# Patient Record
Sex: Female | Born: 1970
Health system: Southern US, Community
[De-identification: ages and names within clinical notes are randomized; demographics above are authoritative.]

## PROBLEM LIST (undated history)

## (undated) DIAGNOSIS — N281 Cyst of kidney, acquired: Secondary | ICD-10-CM

## (undated) DIAGNOSIS — Z973 Presence of spectacles and contact lenses: Secondary | ICD-10-CM

## (undated) DIAGNOSIS — T8859XA Other complications of anesthesia, initial encounter: Secondary | ICD-10-CM

## (undated) DIAGNOSIS — N182 Chronic kidney disease, stage 2 (mild): Secondary | ICD-10-CM

## (undated) DIAGNOSIS — F40240 Claustrophobia: Secondary | ICD-10-CM

## (undated) DIAGNOSIS — Z8489 Family history of other specified conditions: Secondary | ICD-10-CM

## (undated) DIAGNOSIS — R42 Dizziness and giddiness: Secondary | ICD-10-CM

## (undated) DIAGNOSIS — J189 Pneumonia, unspecified organism: Secondary | ICD-10-CM

## (undated) DIAGNOSIS — G43909 Migraine, unspecified, not intractable, without status migrainosus: Secondary | ICD-10-CM

## (undated) DIAGNOSIS — R51 Headache: Secondary | ICD-10-CM

## (undated) DIAGNOSIS — F419 Anxiety disorder, unspecified: Secondary | ICD-10-CM

## (undated) DIAGNOSIS — Z8619 Personal history of other infectious and parasitic diseases: Secondary | ICD-10-CM

## (undated) DIAGNOSIS — Z889 Allergy status to unspecified drugs, medicaments and biological substances status: Secondary | ICD-10-CM

## (undated) DIAGNOSIS — G903 Multi-system degeneration of the autonomic nervous system: Secondary | ICD-10-CM

## (undated) DIAGNOSIS — I4711 Inappropriate sinus tachycardia, so stated: Secondary | ICD-10-CM

## (undated) DIAGNOSIS — E7800 Pure hypercholesterolemia, unspecified: Secondary | ICD-10-CM

## (undated) DIAGNOSIS — I10 Essential (primary) hypertension: Secondary | ICD-10-CM

## (undated) DIAGNOSIS — M1990 Unspecified osteoarthritis, unspecified site: Secondary | ICD-10-CM

## (undated) DIAGNOSIS — J45909 Unspecified asthma, uncomplicated: Secondary | ICD-10-CM

## (undated) DIAGNOSIS — F32A Depression, unspecified: Secondary | ICD-10-CM

## (undated) DIAGNOSIS — R112 Nausea with vomiting, unspecified: Secondary | ICD-10-CM

## (undated) DIAGNOSIS — Z87442 Personal history of urinary calculi: Secondary | ICD-10-CM

## (undated) DIAGNOSIS — I9589 Other hypotension: Secondary | ICD-10-CM

## (undated) DIAGNOSIS — C539 Malignant neoplasm of cervix uteri, unspecified: Secondary | ICD-10-CM

## (undated) DIAGNOSIS — D649 Anemia, unspecified: Secondary | ICD-10-CM

## (undated) DIAGNOSIS — T703XXA Caisson disease [decompression sickness], initial encounter: Secondary | ICD-10-CM

## (undated) DIAGNOSIS — G4730 Sleep apnea, unspecified: Secondary | ICD-10-CM

## (undated) DIAGNOSIS — E039 Hypothyroidism, unspecified: Secondary | ICD-10-CM

## (undated) DIAGNOSIS — E669 Obesity, unspecified: Secondary | ICD-10-CM

## (undated) DIAGNOSIS — N189 Chronic kidney disease, unspecified: Secondary | ICD-10-CM

## (undated) DIAGNOSIS — G8929 Other chronic pain: Secondary | ICD-10-CM

## (undated) DIAGNOSIS — T85192A Other mechanical complication of implanted electronic neurostimulator (electrode) of spinal cord, initial encounter: Secondary | ICD-10-CM

## (undated) DIAGNOSIS — Z9889 Other specified postprocedural states: Secondary | ICD-10-CM

## (undated) DIAGNOSIS — K219 Gastro-esophageal reflux disease without esophagitis: Secondary | ICD-10-CM

## (undated) DIAGNOSIS — R519 Headache, unspecified: Secondary | ICD-10-CM

## (undated) DIAGNOSIS — N179 Acute kidney failure, unspecified: Secondary | ICD-10-CM

## (undated) HISTORY — PX: FLEXIBLE BRONCHOSCOPY: SHX5094

## (undated) HISTORY — PX: LAPAROSCOPIC GASTRIC SLEEVE RESECTION WITH HIATAL HERNIA REPAIR: SHX6512

## (undated) HISTORY — PX: TONSILLECTOMY: SUR1361

## (undated) HISTORY — PX: APPENDECTOMY: SHX54

## (undated) HISTORY — PX: BACK SURGERY: SHX140

## (undated) HISTORY — PX: ABDOMINAL HYSTERECTOMY: SHX81

## (undated) HISTORY — PX: BILIOPANCREATIC DIVISION W DUODENAL SWITCH: SHX1098

## (undated) HISTORY — PX: SPINAL CORD STIMULATOR IMPLANT: SHX2422

## (undated) HISTORY — PX: BILIOPANCREATIC DIVERSION: SHX1097

## (undated) HISTORY — PX: CHOLECYSTECTOMY: SHX55

## (undated) HISTORY — PX: HERNIA REPAIR: SHX51

## (undated) HISTORY — PX: DIAGNOSTIC LAPAROSCOPY: SUR761

## (undated) HISTORY — PX: KNEE ARTHROSCOPY: SUR90

---

## 2005-08-31 ENCOUNTER — Ambulatory Visit: Payer: Self-pay | Admitting: Unknown Physician Specialty

## 2006-08-29 ENCOUNTER — Emergency Department: Payer: Self-pay | Admitting: Emergency Medicine

## 2006-12-31 ENCOUNTER — Ambulatory Visit: Payer: Self-pay | Admitting: Unknown Physician Specialty

## 2007-07-02 ENCOUNTER — Ambulatory Visit: Payer: Self-pay | Admitting: Family Medicine

## 2007-07-02 ENCOUNTER — Encounter (INDEPENDENT_AMBULATORY_CARE_PROVIDER_SITE_OTHER): Payer: Self-pay | Admitting: Internal Medicine

## 2007-07-02 DIAGNOSIS — G43009 Migraine without aura, not intractable, without status migrainosus: Secondary | ICD-10-CM | POA: Insufficient documentation

## 2007-07-02 DIAGNOSIS — J45909 Unspecified asthma, uncomplicated: Secondary | ICD-10-CM | POA: Insufficient documentation

## 2007-07-02 DIAGNOSIS — J309 Allergic rhinitis, unspecified: Secondary | ICD-10-CM | POA: Insufficient documentation

## 2007-07-02 DIAGNOSIS — C539 Malignant neoplasm of cervix uteri, unspecified: Secondary | ICD-10-CM | POA: Insufficient documentation

## 2007-07-02 DIAGNOSIS — J3089 Other allergic rhinitis: Secondary | ICD-10-CM | POA: Insufficient documentation

## 2007-07-02 DIAGNOSIS — R030 Elevated blood-pressure reading, without diagnosis of hypertension: Secondary | ICD-10-CM | POA: Insufficient documentation

## 2007-07-02 DIAGNOSIS — K219 Gastro-esophageal reflux disease without esophagitis: Secondary | ICD-10-CM | POA: Insufficient documentation

## 2007-07-02 DIAGNOSIS — F329 Major depressive disorder, single episode, unspecified: Secondary | ICD-10-CM | POA: Insufficient documentation

## 2007-07-03 ENCOUNTER — Encounter (INDEPENDENT_AMBULATORY_CARE_PROVIDER_SITE_OTHER): Payer: Self-pay | Admitting: Internal Medicine

## 2007-09-25 ENCOUNTER — Ambulatory Visit: Payer: Self-pay

## 2007-10-02 ENCOUNTER — Ambulatory Visit: Payer: Self-pay

## 2007-10-29 ENCOUNTER — Ambulatory Visit: Payer: Self-pay | Admitting: Obstetrics and Gynecology

## 2007-11-04 ENCOUNTER — Inpatient Hospital Stay: Payer: Self-pay | Admitting: Obstetrics and Gynecology

## 2007-11-20 ENCOUNTER — Ambulatory Visit: Payer: Self-pay | Admitting: Obstetrics and Gynecology

## 2007-12-16 ENCOUNTER — Ambulatory Visit: Payer: Self-pay | Admitting: Family Medicine

## 2007-12-16 DIAGNOSIS — M79609 Pain in unspecified limb: Secondary | ICD-10-CM | POA: Insufficient documentation

## 2008-04-28 ENCOUNTER — Ambulatory Visit: Payer: Self-pay | Admitting: Family Medicine

## 2008-04-28 DIAGNOSIS — R609 Edema, unspecified: Secondary | ICD-10-CM | POA: Insufficient documentation

## 2008-04-29 LAB — CONVERTED CEMR LAB
ALT: 41 units/L — ABNORMAL HIGH (ref 0–35)
AST: 24 units/L (ref 0–37)
Albumin: 4.1 g/dL (ref 3.5–5.2)
Alkaline Phosphatase: 54 units/L (ref 39–117)
BUN: 12 mg/dL (ref 6–23)
Basophils Absolute: 0.1 10*3/uL (ref 0.0–0.1)
Basophils Relative: 0.9 % (ref 0.0–3.0)
Bilirubin, Direct: 0.1 mg/dL (ref 0.0–0.3)
CO2: 27 meq/L (ref 19–32)
Calcium: 9.2 mg/dL (ref 8.4–10.5)
Chloride: 109 meq/L (ref 96–112)
Creatinine, Ser: 0.8 mg/dL (ref 0.4–1.2)
Eosinophils Absolute: 0.1 10*3/uL (ref 0.0–0.7)
Eosinophils Relative: 1.4 % (ref 0.0–5.0)
GFR calc Af Amer: 104 mL/min
GFR calc non Af Amer: 86 mL/min
Glucose, Bld: 91 mg/dL (ref 70–99)
HCT: 38.7 % (ref 36.0–46.0)
Hemoglobin: 13.5 g/dL (ref 12.0–15.0)
Lymphocytes Relative: 20.7 % (ref 12.0–46.0)
MCHC: 34.8 g/dL (ref 30.0–36.0)
MCV: 94.5 fL (ref 78.0–100.0)
Monocytes Absolute: 0.3 10*3/uL (ref 0.1–1.0)
Monocytes Relative: 4.7 % (ref 3.0–12.0)
Neutro Abs: 5.1 10*3/uL (ref 1.4–7.7)
Neutrophils Relative %: 72.3 % (ref 43.0–77.0)
Platelets: 299 10*3/uL (ref 150–400)
Potassium: 4.2 meq/L (ref 3.5–5.1)
RBC: 4.1 M/uL (ref 3.87–5.11)
RDW: 12.4 % (ref 11.5–14.6)
Sodium: 140 meq/L (ref 135–145)
TSH: 1.39 microintl units/mL (ref 0.35–5.50)
Total Bilirubin: 0.7 mg/dL (ref 0.3–1.2)
Total Protein: 7.6 g/dL (ref 6.0–8.3)
WBC: 7 10*3/uL (ref 4.5–10.5)

## 2008-09-29 ENCOUNTER — Ambulatory Visit: Payer: Self-pay | Admitting: Obstetrics and Gynecology

## 2008-11-06 ENCOUNTER — Telehealth (INDEPENDENT_AMBULATORY_CARE_PROVIDER_SITE_OTHER): Payer: Self-pay | Admitting: Internal Medicine

## 2009-11-30 ENCOUNTER — Ambulatory Visit: Payer: Self-pay | Admitting: Unknown Physician Specialty

## 2010-04-08 ENCOUNTER — Ambulatory Visit: Payer: Self-pay | Admitting: Unknown Physician Specialty

## 2010-09-21 ENCOUNTER — Ambulatory Visit: Payer: Self-pay | Admitting: Obstetrics and Gynecology

## 2010-10-03 ENCOUNTER — Ambulatory Visit: Payer: Self-pay | Admitting: Obstetrics and Gynecology

## 2010-10-04 NOTE — Assessment & Plan Note (Signed)
Summary: SWOLLEN FEET/CLE   Vital Signs:  Patient Profile:   40 Years Old Female Height:     64.5 inches Weight:      292 pounds Temp:     98.4 degrees F oral Pulse rate:   68 / minute Pulse rhythm:   regular BP sitting:   140 / 80  (left arm) Cuff size:   large  Vitals Entered ByMarland Kitchen Gwinda Maine (April 28, 2008 4:03 PM)                 Chief Complaint:  FEET SWELLING X 1 MONTH OFF AND ON.  History of Present Illness: Feet for past month other than today have been swelling...cut out salt a few weeks ago. Does eat    Prior Medications Reviewed Using: Patient Recall  Current Allergies (reviewed today): ! DEMEROL ! MORPHINE ! CODEINE ! DARVOCET ! * PERCOCET ! * IVP DYE ! TALWIN      Physical Exam  General:     Well-developed,well-nourished,in no acute distress; alert,appropriate and cooperative throughout examination, obese. Head:     Normocephalic and atraumatic without obvious abnormalities. No apparent alopecia or balding. Eyes:     Conjunctiva clear bilaterally.  Ears:     External ear exam shows no significant lesions or deformities.  Otoscopic examination reveals clear canals, tympanic membranes are intact bilaterally without bulging, retraction, inflammation or discharge. Hearing is grossly normal bilaterally. Nose:     External nasal examination shows no deformity or inflammation. Nasal mucosa are pink and moist without lesions or exudates. Mouth:     Oral mucosa and oropharynx without lesions or exudates.  Teeth in good repair. Neck:     No deformities, masses, or tenderness noted. Chest Wall:     No deformities, masses, or tenderness noted. Lungs:     Normal respiratory effort, chest expands symmetrically. Lungs are clear to auscultation, no crackles or wheezes. Heart:     Normal rate and regular rhythm. S1 and S2 normal without gallop, murmur, click, rub or other extra sounds. Abdomen:     Bowel sounds positive,abdomen soft and non-tender  without masses, organomegaly or hernias noted. Abd obese, no masses felt in the pelvis along te outflow tract area. Extremities:     large legs but minimal fluid today bilat.    Impression & Recommendations:  Problem # 1:  EDEMA (W8805310.3) Long discussion of forces of fluid and resultant edema. Avoid salt as discussed incuding bags, boxes and cans. Keep feet and legs moving or at least contracting. Elevate legs as much as able above the level of the heart. Lose weight. Will check labs.  Orders: Venipuncture [id number]) TLB-BMP (Basic Metabolic Panel-BMET) (99991111) TLB-CBC Platelet - w/Differential (85025-CBCD) TLB-Hepatic/Liver Function Pnl (80076-HEPATIC) TLB-TSH (Thyroid Stimulating Hormone) (84443-TSH) Discussed elevation of the legs, use of compression stockings, sodium restiction, and medication use. Will try to avoid meds.  Problem # 2:  ELEVATED BLOOD PRESSURE WITHOUT DIAGNOSIS OF HYPERTENSION (ICD-796.2) Assessment: Unchanged Continues. If she can lose weight, this will normalize. Salt restriction etc will also help. BP today: 140/80 Prior BP: 120/64 (12/16/2007)  Instructed in low sodium diet (DASH Handout) and behavior modification.    Complete Medication List: 1)  Protonix 40 Mg Pack (Pantoprazole sodium) .... 2 hs 2)  Protonix 40 Mg Tbec (Pantoprazole sodium) .... Takes 1 tablet 2 hours before bedtime   Patient Instructions: 1)  RTC if sxs don't improve.   Prescriptions: PROTONIX 40 MG TBEC (PANTOPRAZOLE SODIUM) TAKES 1 TABLET 2 HOURS BEFORE BEDTIME  #  30 x 5   Entered by:   Gwinda Maine   Authorized by:   Raenette Rover MD   Signed by:   Gwinda Maine on 04/28/2008   Method used:   Print then Give to Patient   RxID:   [id number]  ]

## 2011-03-28 ENCOUNTER — Ambulatory Visit: Payer: Self-pay | Admitting: Obstetrics and Gynecology

## 2011-11-27 ENCOUNTER — Ambulatory Visit: Payer: Self-pay | Admitting: Obstetrics and Gynecology

## 2012-11-22 ENCOUNTER — Ambulatory Visit: Payer: Self-pay | Admitting: Orthopedic Surgery

## 2013-01-07 ENCOUNTER — Ambulatory Visit: Payer: Self-pay | Admitting: Nurse Practitioner

## 2013-06-23 ENCOUNTER — Ambulatory Visit: Payer: Self-pay | Admitting: Bariatrics

## 2013-06-24 ENCOUNTER — Ambulatory Visit: Payer: Self-pay | Admitting: Bariatrics

## 2013-06-24 DIAGNOSIS — Z01810 Encounter for preprocedural cardiovascular examination: Secondary | ICD-10-CM

## 2013-06-24 LAB — CBC WITH DIFFERENTIAL/PLATELET
Basophil #: 0 10*3/uL (ref 0.0–0.1)
Basophil %: 0.6 %
Eosinophil #: 0.1 10*3/uL (ref 0.0–0.7)
Eosinophil %: 1.4 %
HCT: 37.5 % (ref 35.0–47.0)
HGB: 13 g/dL (ref 12.0–16.0)
Lymphocyte #: 1.8 10*3/uL (ref 1.0–3.6)
Lymphocyte %: 25.5 %
MCH: 33.4 pg (ref 26.0–34.0)
MCHC: 34.8 g/dL (ref 32.0–36.0)
MCV: 96 fL (ref 80–100)
Monocyte #: 0.4 x10 3/mm (ref 0.2–0.9)
Monocyte %: 6 %
Neutrophil #: 4.7 10*3/uL (ref 1.4–6.5)
Neutrophil %: 66.5 %
Platelet: 293 10*3/uL (ref 150–440)
RBC: 3.9 10*6/uL (ref 3.80–5.20)
RDW: 12.9 % (ref 11.5–14.5)
WBC: 7.1 10*3/uL (ref 3.6–11.0)

## 2013-06-24 LAB — FERRITIN: Ferritin (ARMC): 110 ng/mL (ref 8–388)

## 2013-06-24 LAB — COMPREHENSIVE METABOLIC PANEL
Albumin: 3.7 g/dL (ref 3.4–5.0)
Alkaline Phosphatase: 75 U/L (ref 50–136)
Anion Gap: 5 — ABNORMAL LOW (ref 7–16)
BUN: 8 mg/dL (ref 7–18)
Bilirubin,Total: 0.5 mg/dL (ref 0.2–1.0)
Calcium, Total: 9 mg/dL (ref 8.5–10.1)
Chloride: 105 mmol/L (ref 98–107)
Co2: 28 mmol/L (ref 21–32)
Creatinine: 0.77 mg/dL (ref 0.60–1.30)
EGFR (African American): >60
EGFR (Non-African Amer.): >60
Glucose: 82 mg/dL (ref 65–99)
Osmolality: 273 (ref 275–301)
Potassium: 3.9 mmol/L (ref 3.5–5.1)
SGOT(AST): 38 U/L — ABNORMAL HIGH (ref 15–37)
SGPT (ALT): 74 U/L (ref 12–78)
Sodium: 138 mmol/L (ref 136–145)
Total Protein: 7.8 g/dL (ref 6.4–8.2)

## 2013-06-24 LAB — LIPASE, BLOOD: Lipase: 125 U/L (ref 73–393)

## 2013-06-24 LAB — IRON AND TIBC
Iron Bind.Cap.(Total): 381 ug/dL (ref 250–450)
Iron Saturation: 22 %
Iron: 85 ug/dL (ref 50–170)
Unbound Iron-Bind.Cap.: 296 ug/dL

## 2013-06-24 LAB — BILIRUBIN, DIRECT: Bilirubin, Direct: 0.1 mg/dL (ref 0.00–0.20)

## 2013-06-24 LAB — MAGNESIUM: Magnesium: 1.4 mg/dL — ABNORMAL LOW

## 2013-06-24 LAB — AMYLASE: Amylase: 45 U/L (ref 25–115)

## 2013-06-24 LAB — PROTIME-INR
INR: 0.9
Prothrombin Time: 12.6 secs (ref 11.5–14.7)

## 2013-06-24 LAB — FOLATE: Folic Acid: 19.4 ng/mL (ref 3.1–100.0)

## 2013-06-24 LAB — TSH: Thyroid Stimulating Horm: 2.22 u[IU]/mL

## 2013-06-24 LAB — HEMOGLOBIN A1C: Hemoglobin A1C: 5.1 % (ref 4.2–6.3)

## 2013-06-24 LAB — APTT: Activated PTT: 30 secs (ref 23.6–35.9)

## 2013-06-24 LAB — PHOSPHORUS: Phosphorus: 2.3 mg/dL — ABNORMAL LOW (ref 2.5–4.9)

## 2013-07-05 ENCOUNTER — Ambulatory Visit: Payer: Self-pay | Admitting: Bariatrics

## 2013-09-30 ENCOUNTER — Ambulatory Visit: Payer: Self-pay | Admitting: Obstetrics and Gynecology

## 2014-01-02 ENCOUNTER — Ambulatory Visit: Payer: Self-pay | Admitting: Bariatrics

## 2014-01-23 ENCOUNTER — Ambulatory Visit: Payer: Self-pay | Admitting: Bariatrics

## 2014-06-16 ENCOUNTER — Ambulatory Visit: Payer: Self-pay | Admitting: Bariatrics

## 2014-06-23 ENCOUNTER — Inpatient Hospital Stay: Payer: Self-pay | Admitting: Bariatrics

## 2014-06-23 LAB — CREATININE, SERUM
Creatinine: 0.81 mg/dL (ref 0.60–1.30)
EGFR (African American): >60
EGFR (Non-African Amer.): >60

## 2014-06-24 LAB — BASIC METABOLIC PANEL
Anion Gap: 7 (ref 7–16)
BUN: 4 mg/dL — ABNORMAL LOW (ref 7–18)
Calcium, Total: 7.9 mg/dL — ABNORMAL LOW (ref 8.5–10.1)
Chloride: 107 mmol/L (ref 98–107)
Co2: 28 mmol/L (ref 21–32)
Creatinine: 0.84 mg/dL (ref 0.60–1.30)
EGFR (African American): >60
EGFR (Non-African Amer.): >60
Glucose: 110 mg/dL — ABNORMAL HIGH (ref 65–99)
Osmolality: 281 (ref 275–301)
Potassium: 4 mmol/L (ref 3.5–5.1)
Sodium: 142 mmol/L (ref 136–145)

## 2014-06-24 LAB — CBC WITH DIFFERENTIAL/PLATELET
Basophil #: 0 10*3/uL (ref 0.0–0.1)
Basophil %: 0.1 %
Eosinophil #: 0 10*3/uL (ref 0.0–0.7)
Eosinophil %: 0 %
HCT: 36.6 % (ref 35.0–47.0)
HGB: 11.8 g/dL — ABNORMAL LOW (ref 12.0–16.0)
Lymphocyte #: 1.2 10*3/uL (ref 1.0–3.6)
Lymphocyte %: 11.5 %
MCH: 31.1 pg (ref 26.0–34.0)
MCHC: 32.1 g/dL (ref 32.0–36.0)
MCV: 97 fL (ref 80–100)
Monocyte #: 0.7 x10 3/mm (ref 0.2–0.9)
Monocyte %: 6.7 %
Neutrophil #: 8.2 10*3/uL — ABNORMAL HIGH (ref 1.4–6.5)
Neutrophil %: 81.7 %
Platelet: 283 10*3/uL (ref 150–440)
RBC: 3.78 10*6/uL — ABNORMAL LOW (ref 3.80–5.20)
RDW: 13.2 % (ref 11.5–14.5)
WBC: 10 10*3/uL (ref 3.6–11.0)

## 2014-06-24 LAB — MAGNESIUM: Magnesium: 1.8 mg/dL

## 2014-10-13 ENCOUNTER — Ambulatory Visit: Payer: Self-pay | Admitting: Obstetrics and Gynecology

## 2014-10-16 DIAGNOSIS — M4726 Other spondylosis with radiculopathy, lumbar region: Secondary | ICD-10-CM | POA: Insufficient documentation
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
## 2014-12-28 LAB — SURGICAL PATHOLOGY

## 2016-01-18 ENCOUNTER — Other Ambulatory Visit: Payer: Self-pay | Admitting: Orthopaedic Surgery

## 2016-01-21 ENCOUNTER — Encounter (HOSPITAL_COMMUNITY): Payer: Self-pay

## 2016-01-21 ENCOUNTER — Encounter (HOSPITAL_COMMUNITY)
Admission: RE | Admit: 2016-01-21 | Discharge: 2016-01-21 | Disposition: A | Discharge status: Home / Self Care | Payer: BLUE CROSS/BLUE SHIELD | Source: Ambulatory Visit | Attending: Orthopaedic Surgery | Admitting: Orthopaedic Surgery

## 2016-01-21 DIAGNOSIS — I498 Other specified cardiac arrhythmias: Secondary | ICD-10-CM | POA: Diagnosis not present

## 2016-01-21 DIAGNOSIS — Z0183 Encounter for blood typing: Secondary | ICD-10-CM | POA: Diagnosis not present

## 2016-01-21 DIAGNOSIS — Z01812 Encounter for preprocedural laboratory examination: Secondary | ICD-10-CM | POA: Diagnosis not present

## 2016-01-21 DIAGNOSIS — Z01818 Encounter for other preprocedural examination: Secondary | ICD-10-CM | POA: Insufficient documentation

## 2016-01-21 DIAGNOSIS — M1612 Unilateral primary osteoarthritis, left hip: Secondary | ICD-10-CM | POA: Insufficient documentation

## 2016-01-21 DIAGNOSIS — I10 Essential (primary) hypertension: Secondary | ICD-10-CM | POA: Insufficient documentation

## 2016-01-21 HISTORY — DX: Sleep apnea, unspecified: G47.30

## 2016-01-21 HISTORY — DX: Other specified postprocedural states: Z98.890

## 2016-01-21 HISTORY — DX: Headache: R51

## 2016-01-21 HISTORY — DX: Anemia, unspecified: D64.9

## 2016-01-21 HISTORY — DX: Headache, unspecified: R51.9

## 2016-01-21 HISTORY — DX: Allergy status to unspecified drugs, medicaments and biological substances: Z88.9

## 2016-01-21 HISTORY — DX: Personal history of other infectious and parasitic diseases: Z86.19

## 2016-01-21 HISTORY — DX: Unspecified osteoarthritis, unspecified site: M19.90

## 2016-01-21 HISTORY — DX: Obesity, unspecified: E66.9

## 2016-01-21 HISTORY — DX: Nausea with vomiting, unspecified: R11.2

## 2016-01-21 HISTORY — DX: Family history of other specified conditions: Z84.89

## 2016-01-21 HISTORY — DX: Essential (primary) hypertension: I10

## 2016-01-21 HISTORY — DX: Presence of spectacles and contact lenses: Z97.3

## 2016-01-21 LAB — PROTIME-INR
INR: 1.09 (ref 0.00–1.49)
Prothrombin Time: 14.3 seconds (ref 11.6–15.2)

## 2016-01-21 LAB — CBC WITH DIFFERENTIAL/PLATELET
Basophils Absolute: 0 10*3/uL (ref 0.0–0.1)
Basophils Relative: 0 %
Eosinophils Absolute: 0.2 10*3/uL (ref 0.0–0.7)
Eosinophils Relative: 3 %
HCT: 41 % (ref 36.0–46.0)
Hemoglobin: 13.4 g/dL (ref 12.0–15.0)
Lymphocytes Relative: 33 %
Lymphs Abs: 2.1 10*3/uL (ref 0.7–4.0)
MCH: 31.3 pg (ref 26.0–34.0)
MCHC: 32.7 g/dL (ref 30.0–36.0)
MCV: 95.8 fL (ref 78.0–100.0)
Monocytes Absolute: 0.4 10*3/uL (ref 0.1–1.0)
Monocytes Relative: 7 %
Neutro Abs: 3.7 10*3/uL (ref 1.7–7.7)
Neutrophils Relative %: 57 %
Platelets: 314 10*3/uL (ref 150–400)
RBC: 4.28 MIL/uL (ref 3.87–5.11)
RDW: 12.5 % (ref 11.5–15.5)
WBC: 6.4 10*3/uL (ref 4.0–10.5)

## 2016-01-21 LAB — COMPREHENSIVE METABOLIC PANEL
ALT: 20 U/L (ref 14–54)
AST: 20 U/L (ref 15–41)
Albumin: 4.1 g/dL (ref 3.5–5.0)
Alkaline Phosphatase: 56 U/L (ref 38–126)
Anion gap: 9 (ref 5–15)
BUN: 6 mg/dL (ref 6–20)
CO2: 25 mmol/L (ref 22–32)
Calcium: 9.5 mg/dL (ref 8.9–10.3)
Chloride: 104 mmol/L (ref 101–111)
Creatinine, Ser: 0.93 mg/dL (ref 0.44–1.00)
GFR calc Af Amer: >60 mL/min (ref >60)
GFR calc non Af Amer: >60 mL/min (ref >60)
Glucose, Bld: 93 mg/dL (ref 65–99)
Potassium: 4.1 mmol/L (ref 3.5–5.1)
Sodium: 138 mmol/L (ref 135–145)
Total Bilirubin: 0.8 mg/dL (ref 0.3–1.2)
Total Protein: 7.5 g/dL (ref 6.5–8.1)

## 2016-01-21 LAB — URINALYSIS, ROUTINE W REFLEX MICROSCOPIC
Bilirubin Urine: NEGATIVE
Glucose, UA: NEGATIVE mg/dL
Hgb urine dipstick: NEGATIVE
Ketones, ur: NEGATIVE mg/dL
Leukocytes, UA: NEGATIVE
Nitrite: NEGATIVE
Protein, ur: NEGATIVE mg/dL
Specific Gravity, Urine: 1.021 (ref 1.005–1.030)
pH: 6 (ref 5.0–8.0)

## 2016-01-21 LAB — SEDIMENTATION RATE: Sed Rate: 15 mm/hr (ref 0–22)

## 2016-01-21 LAB — TYPE AND SCREEN
ABO/RH(D): A NEG
Antibody Screen: NEGATIVE

## 2016-01-21 LAB — SURGICAL PCR SCREEN
MRSA, PCR: NEGATIVE
Staphylococcus aureus: NEGATIVE

## 2016-01-21 LAB — APTT: aPTT: 30 seconds (ref 24–37)

## 2016-01-21 LAB — C-REACTIVE PROTEIN: CRP: <0.5 mg/dL (ref <1.0)

## 2016-01-21 LAB — ABO/RH: ABO/RH(D): A NEG

## 2016-01-21 NOTE — Progress Notes (Signed)
Pt denies SOB, chest pain, and being under the care of a cardiologist. Pt denies having a stress test, echo and cardiac cath. Pt denies having a chest x ray and EKG within the last year.

## 2016-01-21 NOTE — Pre-Procedure Instructions (Addendum)
Vanessa Medina  01/21/2016      [id number] - [place], [place] - [address]  [id number] Phone: [phone] Fax: [phone]    Your procedure is scheduled on Wednesday, Jan 26, 2016  Report to [facility] Admitting at 11:20 A.M.  Call this number if you have problems the morning of surgery:  [phone]   Remember:  Do not eat food or drink liquids after midnight Tuesday, Jan 25, 2016  Take these medicines the morning of surgery with A SIP OF WATER : None  Stop taking Aspirin, , vitamins, fish oil,and herbal medications. Do not take any NSAIDs ie: Ibuprofen, Advil, Naproxen, BC and Goody Powder or any medication containing Aspirin; stop now.  Do not wear jewelry, make-up or nail polish.  Do not wear lotions, powders, or perfumes.  You may not wear deodorant.  Do not shave 48 hours prior to surgery.   Do not bring valuables to the hospital.  [facility] is not responsible for any belongings or valuables.  Contacts, dentures or bridgework may not be worn into surgery.  Leave your suitcase in the car.  After surgery it may be brought to your room.  For patients admitted to the hospital, discharge time will be determined by your treatment team.  Patients discharged the day of surgery will not be allowed to drive home.   Name and phone number of your driver:   Special instructions:  [place] - Preparing for Surgery  Before surgery, you can play an important role.  Because skin is not sterile, your skin needs to be as free of germs as possible.  You can reduce the number of germs on you skin by washing with CHG (chlorahexidine gluconate) soap before surgery.  CHG is an antiseptic cleaner which kills germs and bonds with the skin to continue killing germs even after washing.  Please DO NOT use if you have an allergy to CHG or antibacterial soaps.  If your skin becomes reddened/irritated stop using the CHG and inform your  nurse when you arrive at Short Stay.  Do not shave (including legs and underarms) for at least 48 hours prior to the first CHG shower.  You may shave your face.  Please follow these instructions carefully:   1.  Shower with CHG Soap the night before surgery and the morning of Surgery.  2.  If you choose to wash your hair, wash your hair first as usual with your normal shampoo.  3.  After you shampoo, rinse your hair and body thoroughly to remove the Shampoo.  4.  Use CHG as you would any other liquid soap.  You can apply chg directly  to the skin and wash gently with scrungie or a clean washcloth.  5.  Apply the CHG Soap to your body ONLY FROM THE NECK DOWN.  Do not use on open wounds or open sores.  Avoid contact with your eyes, ears, mouth and genitals (private parts).  Wash genitals (private parts) with your normal soap.  6.  Wash thoroughly, paying special attention to the area where your surgery will be performed.  7.  Thoroughly rinse your body with warm water from the neck down.  8.  DO NOT shower/wash with your normal soap after using and rinsing off the CHG Soap.  9.  Pat yourself dry with a clean towel.            10.  Wear clean pajamas.  11.  Place clean sheets on your bed the night of your first shower and do not sleep with pets.  Day of Surgery  Do not apply any lotions/deodorants the morning of surgery.  Please wear clean clothes to the hospital/surgery center.  Please read over the following fact sheets that you were given. Pain Booklet, Coughing and Deep Breathing, Blood Transfusion Information, Total Joint Packet, MRSA Information and Surgical Site Infection Prevention

## 2016-01-25 MED ORDER — DEXTROSE 5 % IV SOLN
3.0000 g | INTRAVENOUS | Status: AC
  Administered 2016-01-26: 15:00:00 via INTRAVENOUS
  Administered 2016-01-26: 3 g via INTRAVENOUS
  Filled 2016-01-25: qty 3000

## 2016-01-25 MED ORDER — TRANEXAMIC ACID 1000 MG/10ML IV SOLN
1000.0000 mg | INTRAVENOUS | Status: DC
  Filled 2016-01-25: qty 10

## 2016-01-25 MED ORDER — BUPIVACAINE LIPOSOME 1.3 % IJ SUSP
20.0000 mL | Freq: Once | INTRAMUSCULAR | Status: AC
  Administered 2016-01-26: 20 mL
  Filled 2016-01-25: qty 20

## 2016-01-26 ENCOUNTER — Inpatient Hospital Stay (HOSPITAL_COMMUNITY)
Admission: RE | Admit: 2016-01-26 | Discharge: 2016-01-30 | DRG: 470 | Disposition: A | Discharge status: Home Health | Payer: BLUE CROSS/BLUE SHIELD | Source: Ambulatory Visit | Attending: Orthopaedic Surgery | Admitting: Orthopaedic Surgery

## 2016-01-26 ENCOUNTER — Inpatient Hospital Stay (HOSPITAL_COMMUNITY): Payer: BLUE CROSS/BLUE SHIELD | Admitting: Anesthesiology

## 2016-01-26 ENCOUNTER — Inpatient Hospital Stay (HOSPITAL_COMMUNITY): Payer: BLUE CROSS/BLUE SHIELD

## 2016-01-26 ENCOUNTER — Encounter (HOSPITAL_COMMUNITY): Payer: Self-pay | Admitting: Anesthesiology

## 2016-01-26 ENCOUNTER — Encounter (HOSPITAL_COMMUNITY)
Admission: RE | Disposition: A | Discharge status: Home Health | Payer: Self-pay | Source: Ambulatory Visit | Attending: Orthopaedic Surgery

## 2016-01-26 DIAGNOSIS — Z91030 Bee allergy status: Secondary | ICD-10-CM | POA: Diagnosis not present

## 2016-01-26 DIAGNOSIS — G4730 Sleep apnea, unspecified: Secondary | ICD-10-CM | POA: Diagnosis present

## 2016-01-26 DIAGNOSIS — Z91013 Allergy to seafood: Secondary | ICD-10-CM

## 2016-01-26 DIAGNOSIS — Z96642 Presence of left artificial hip joint: Secondary | ICD-10-CM

## 2016-01-26 DIAGNOSIS — Z886 Allergy status to analgesic agent status: Secondary | ICD-10-CM | POA: Diagnosis not present

## 2016-01-26 DIAGNOSIS — Z6841 Body Mass Index (BMI) 40.0 and over, adult: Secondary | ICD-10-CM | POA: Diagnosis not present

## 2016-01-26 DIAGNOSIS — Z91041 Radiographic dye allergy status: Secondary | ICD-10-CM

## 2016-01-26 DIAGNOSIS — Z8249 Family history of ischemic heart disease and other diseases of the circulatory system: Secondary | ICD-10-CM | POA: Diagnosis not present

## 2016-01-26 DIAGNOSIS — M1612 Unilateral primary osteoarthritis, left hip: Secondary | ICD-10-CM | POA: Diagnosis present

## 2016-01-26 DIAGNOSIS — D62 Acute posthemorrhagic anemia: Secondary | ICD-10-CM | POA: Diagnosis not present

## 2016-01-26 DIAGNOSIS — I10 Essential (primary) hypertension: Secondary | ICD-10-CM | POA: Diagnosis present

## 2016-01-26 DIAGNOSIS — Z888 Allergy status to other drugs, medicaments and biological substances status: Secondary | ICD-10-CM | POA: Diagnosis not present

## 2016-01-26 DIAGNOSIS — Z23 Encounter for immunization: Secondary | ICD-10-CM | POA: Diagnosis not present

## 2016-01-26 DIAGNOSIS — Z823 Family history of stroke: Secondary | ICD-10-CM

## 2016-01-26 DIAGNOSIS — Z885 Allergy status to narcotic agent status: Secondary | ICD-10-CM | POA: Diagnosis not present

## 2016-01-26 DIAGNOSIS — Z9889 Other specified postprocedural states: Secondary | ICD-10-CM

## 2016-01-26 DIAGNOSIS — Z87892 Personal history of anaphylaxis: Secondary | ICD-10-CM | POA: Diagnosis not present

## 2016-01-26 DIAGNOSIS — Z8619 Personal history of other infectious and parasitic diseases: Secondary | ICD-10-CM | POA: Diagnosis not present

## 2016-01-26 DIAGNOSIS — Z419 Encounter for procedure for purposes other than remedying health state, unspecified: Secondary | ICD-10-CM

## 2016-01-26 DIAGNOSIS — Z96649 Presence of unspecified artificial hip joint: Secondary | ICD-10-CM

## 2016-01-26 HISTORY — PX: TOTAL HIP ARTHROPLASTY: SHX124

## 2016-01-26 SURGERY — ARTHROPLASTY, HIP, TOTAL, ANTERIOR APPROACH
Anesthesia: General | Site: Hip | Laterality: Left

## 2016-01-26 MED ORDER — METOCLOPRAMIDE HCL 5 MG/ML IJ SOLN
INTRAMUSCULAR | Status: AC
  Filled 2016-01-26: qty 2

## 2016-01-26 MED ORDER — NEOSTIGMINE METHYLSULFATE 5 MG/5ML IV SOSY
PREFILLED_SYRINGE | INTRAVENOUS | Status: AC
  Filled 2016-01-26: qty 5

## 2016-01-26 MED ORDER — ACETAMINOPHEN 325 MG PO TABS: 650.0000 mg | ORAL_TABLET | Freq: Four times a day (QID) | ORAL | Status: DC | PRN

## 2016-01-26 MED ORDER — SODIUM CHLORIDE 0.9 % IV SOLN
INTRAVENOUS | Status: DC
  Administered 2016-01-26: 125 mL/h via INTRAVENOUS

## 2016-01-26 MED ORDER — METOCLOPRAMIDE HCL 5 MG/ML IJ SOLN
INTRAMUSCULAR | Status: DC | PRN
  Administered 2016-01-26: 10 mg via INTRAVENOUS

## 2016-01-26 MED ORDER — CEFAZOLIN SODIUM-DEXTROSE 2-4 GM/100ML-% IV SOLN
2.0000 g | Freq: Four times a day (QID) | INTRAVENOUS | Status: AC
  Administered 2016-01-26 – 2016-01-27 (×2): 2 g via INTRAVENOUS
  Filled 2016-01-26 (×2): qty 100

## 2016-01-26 MED ORDER — HYDROMORPHONE HCL 1 MG/ML IJ SOLN
INTRAMUSCULAR | Status: AC
  Administered 2016-01-26: 0.5 mg via INTRAVENOUS
  Filled 2016-01-26: qty 1

## 2016-01-26 MED ORDER — FENTANYL CITRATE (PF) 100 MCG/2ML IJ SOLN
INTRAMUSCULAR | Status: DC | PRN
Start: 2016-01-26 — End: 2016-01-26
  Administered 2016-01-26 (×3): 150 ug via INTRAVENOUS
  Administered 2016-01-26: 50 ug via INTRAVENOUS
  Administered 2016-01-26 (×2): 100 ug via INTRAVENOUS

## 2016-01-26 MED ORDER — SUCCINYLCHOLINE CHLORIDE 200 MG/10ML IV SOSY
PREFILLED_SYRINGE | INTRAVENOUS | Status: AC
  Filled 2016-01-26: qty 10

## 2016-01-26 MED ORDER — MENTHOL 3 MG MT LOZG: 1.0000 | LOZENGE | OROMUCOSAL | Status: DC | PRN

## 2016-01-26 MED ORDER — ACETAMINOPHEN 500 MG PO TABS
1000.0000 mg | ORAL_TABLET | Freq: Four times a day (QID) | ORAL | Status: AC
  Administered 2016-01-26 – 2016-01-27 (×3): 1000 mg via ORAL
  Filled 2016-01-26 (×4): qty 2

## 2016-01-26 MED ORDER — SCOPOLAMINE 1 MG/3DAYS TD PT72
1.0000 | MEDICATED_PATCH | TRANSDERMAL | Status: DC
  Administered 2016-01-26: 1.5 mg via TRANSDERMAL
  Filled 2016-01-26: qty 1

## 2016-01-26 MED ORDER — ROCURONIUM BROMIDE 50 MG/5ML IV SOLN
INTRAVENOUS | Status: AC
Start: 2016-01-26 — End: 2016-01-26
  Filled 2016-01-26: qty 1

## 2016-01-26 MED ORDER — METHOCARBAMOL 1000 MG/10ML IJ SOLN
500.0000 mg | Freq: Four times a day (QID) | INTRAVENOUS | Status: DC | PRN
  Filled 2016-01-26: qty 5

## 2016-01-26 MED ORDER — PRAMIPEXOLE DIHYDROCHLORIDE 0.125 MG PO TABS
0.5000 mg | ORAL_TABLET | Freq: Every day | ORAL | Status: DC
  Administered 2016-01-26 – 2016-01-29 (×4): 0.5 mg via ORAL
  Filled 2016-01-26 (×4): qty 4

## 2016-01-26 MED ORDER — HYDROCODONE-ACETAMINOPHEN 10-325 MG PO TABS: 1.0000 | ORAL_TABLET | Freq: Four times a day (QID) | ORAL | Status: DC | PRN

## 2016-01-26 MED ORDER — LABETALOL HCL 5 MG/ML IV SOLN
INTRAVENOUS | Status: DC | PRN
  Administered 2016-01-26: 5 mg via INTRAVENOUS

## 2016-01-26 MED ORDER — KETOROLAC TROMETHAMINE 30 MG/ML IJ SOLN
30.0000 mg | Freq: Four times a day (QID) | INTRAMUSCULAR | Status: AC | PRN
  Administered 2016-01-26 – 2016-01-27 (×3): 30 mg via INTRAVENOUS
  Filled 2016-01-26 (×3): qty 1

## 2016-01-26 MED ORDER — FENTANYL CITRATE (PF) 250 MCG/5ML IJ SOLN
INTRAMUSCULAR | Status: AC
  Filled 2016-01-26: qty 5

## 2016-01-26 MED ORDER — ASPIRIN EC 325 MG PO TBEC: 325.0000 mg | DELAYED_RELEASE_TABLET | Freq: Two times a day (BID) | ORAL | Status: DC

## 2016-01-26 MED ORDER — GLYCOPYRROLATE 0.2 MG/ML IV SOSY
PREFILLED_SYRINGE | INTRAVENOUS | Status: AC
  Filled 2016-01-26: qty 3

## 2016-01-26 MED ORDER — ONDANSETRON HCL 4 MG PO TABS
4.0000 mg | ORAL_TABLET | Freq: Four times a day (QID) | ORAL | Status: DC | PRN
  Administered 2016-01-27: 4 mg via ORAL
  Filled 2016-01-26: qty 1

## 2016-01-26 MED ORDER — LIDOCAINE HCL (CARDIAC) 20 MG/ML IV SOLN
INTRAVENOUS | Status: DC | PRN
  Administered 2016-01-26: 60 mg via INTRAVENOUS

## 2016-01-26 MED ORDER — LACTATED RINGERS IV SOLN
INTRAVENOUS | Status: DC
  Administered 2016-01-26 (×2): via INTRAVENOUS

## 2016-01-26 MED ORDER — ONDANSETRON HCL 4 MG/2ML IJ SOLN
4.0000 mg | Freq: Four times a day (QID) | INTRAMUSCULAR | Status: DC | PRN
  Administered 2016-01-26 – 2016-01-29 (×3): 4 mg via INTRAVENOUS
  Filled 2016-01-26 (×3): qty 2

## 2016-01-26 MED ORDER — ACETAMINOPHEN 650 MG RE SUPP: 650.0000 mg | Freq: Four times a day (QID) | RECTAL | Status: DC | PRN

## 2016-01-26 MED ORDER — HYDROCODONE-ACETAMINOPHEN 10-325 MG PO TABS
1.0000 | ORAL_TABLET | ORAL | Status: DC | PRN
  Administered 2016-01-27: 2 via ORAL
  Administered 2016-01-27 (×2): 1 via ORAL
  Administered 2016-01-27 – 2016-01-30 (×10): 2 via ORAL
  Administered 2016-01-30 (×2): 1 via ORAL
  Filled 2016-01-26: qty 1
  Filled 2016-01-26: qty 2
  Filled 2016-01-26: qty 1
  Filled 2016-01-26 (×7): qty 2
  Filled 2016-01-26: qty 1
  Filled 2016-01-26 (×3): qty 2
  Filled 2016-01-26: qty 1
  Filled 2016-01-26: qty 2

## 2016-01-26 MED ORDER — ONDANSETRON HCL 4 MG/2ML IJ SOLN
INTRAMUSCULAR | Status: DC | PRN
  Administered 2016-01-26: 4 mg via INTRAVENOUS

## 2016-01-26 MED ORDER — SODIUM CHLORIDE 0.9 % IR SOLN
Status: DC | PRN
  Administered 2016-01-26: 1000 mL

## 2016-01-26 MED ORDER — ROCURONIUM BROMIDE 50 MG/5ML IV SOLN
INTRAVENOUS | Status: AC
  Filled 2016-01-26: qty 1

## 2016-01-26 MED ORDER — MORPHINE SULFATE (PF) 2 MG/ML IV SOLN
1.0000 mg | INTRAVENOUS | Status: DC | PRN
  Administered 2016-01-30: 1 mg via INTRAVENOUS
  Filled 2016-01-26: qty 1

## 2016-01-26 MED ORDER — METHOCARBAMOL 500 MG PO TABS
500.0000 mg | ORAL_TABLET | Freq: Four times a day (QID) | ORAL | Status: DC | PRN
  Administered 2016-01-26 – 2016-01-28 (×5): 500 mg via ORAL
  Filled 2016-01-26 (×6): qty 1

## 2016-01-26 MED ORDER — TRIAMTERENE-HCTZ 37.5-25 MG PO TABS
1.0000 | ORAL_TABLET | Freq: Every day | ORAL | Status: DC
  Administered 2016-01-27 – 2016-01-30 (×4): 1 via ORAL
  Filled 2016-01-26 (×5): qty 1

## 2016-01-26 MED ORDER — SENNOSIDES-DOCUSATE SODIUM 8.6-50 MG PO TABS
1.0000 | ORAL_TABLET | Freq: Every evening | ORAL | Status: DC | PRN
Start: 2016-01-26 — End: 2017-03-28

## 2016-01-26 MED ORDER — ONDANSETRON HCL 4 MG/2ML IJ SOLN
INTRAMUSCULAR | Status: AC
  Filled 2016-01-26: qty 2

## 2016-01-26 MED ORDER — SODIUM CHLORIDE 0.9 % IV SOLN
2000.0000 mg | Freq: Once | INTRAVENOUS | Status: DC
Start: 2016-01-26 — End: 2016-01-30
  Filled 2016-01-26: qty 20

## 2016-01-26 MED ORDER — PNEUMOCOCCAL VAC POLYVALENT 25 MCG/0.5ML IJ INJ
0.5000 mL | INJECTION | INTRAMUSCULAR | Status: AC
  Administered 2016-01-27: 0.5 mL via INTRAMUSCULAR
  Filled 2016-01-26: qty 0.5

## 2016-01-26 MED ORDER — PROPOFOL 10 MG/ML IV BOLUS
INTRAVENOUS | Status: DC | PRN
  Administered 2016-01-26: 50 mg via INTRAVENOUS
  Administered 2016-01-26: 150 mg via INTRAVENOUS

## 2016-01-26 MED ORDER — PHENOL 1.4 % MT LIQD: 1.0000 | OROMUCOSAL | Status: DC | PRN

## 2016-01-26 MED ORDER — TRANEXAMIC ACID 1000 MG/10ML IV SOLN
1000.0000 mg | INTRAVENOUS | Status: DC | PRN
  Administered 2016-01-26: 1000 mg via INTRAVENOUS

## 2016-01-26 MED ORDER — MIDAZOLAM HCL 2 MG/2ML IJ SOLN
INTRAMUSCULAR | Status: AC
  Filled 2016-01-26: qty 2

## 2016-01-26 MED ORDER — SORBITOL 70 % SOLN: 30.0000 mL | Freq: Every day | Status: DC | PRN

## 2016-01-26 MED ORDER — METHOCARBAMOL 750 MG PO TABS: 750.0000 mg | ORAL_TABLET | Freq: Two times a day (BID) | ORAL | Status: DC | PRN

## 2016-01-26 MED ORDER — LIDOCAINE 2% (20 MG/ML) 5 ML SYRINGE
INTRAMUSCULAR | Status: AC
  Filled 2016-01-26: qty 5

## 2016-01-26 MED ORDER — ROCURONIUM BROMIDE 100 MG/10ML IV SOLN
INTRAVENOUS | Status: DC | PRN
  Administered 2016-01-26: 30 mg via INTRAVENOUS
  Administered 2016-01-26: 40 mg via INTRAVENOUS

## 2016-01-26 MED ORDER — ONDANSETRON HCL 4 MG PO TABS: 4.0000 mg | ORAL_TABLET | Freq: Three times a day (TID) | ORAL | Status: DC | PRN

## 2016-01-26 MED ORDER — NEOSTIGMINE METHYLSULFATE 10 MG/10ML IV SOLN
INTRAVENOUS | Status: DC | PRN
  Administered 2016-01-26: 3 mg via INTRAVENOUS

## 2016-01-26 MED ORDER — TRANEXAMIC ACID 1000 MG/10ML IV SOLN
1000.0000 mg | Freq: Once | INTRAVENOUS | Status: AC
  Administered 2016-01-26: 1000 mg via INTRAVENOUS
  Filled 2016-01-26: qty 10

## 2016-01-26 MED ORDER — METOCLOPRAMIDE HCL 5 MG/ML IJ SOLN
5.0000 mg | Freq: Three times a day (TID) | INTRAMUSCULAR | Status: DC | PRN
  Administered 2016-01-29: 10 mg via INTRAVENOUS
  Filled 2016-01-26: qty 2

## 2016-01-26 MED ORDER — DEXAMETHASONE SODIUM PHOSPHATE 10 MG/ML IJ SOLN
10.0000 mg | Freq: Once | INTRAMUSCULAR | Status: AC
  Administered 2016-01-27: 10 mg via INTRAVENOUS
  Filled 2016-01-26: qty 1

## 2016-01-26 MED ORDER — MAGNESIUM CITRATE PO SOLN: 1.0000 | Freq: Once | ORAL | Status: DC | PRN

## 2016-01-26 MED ORDER — ENSURE ENLIVE PO LIQD
237.0000 mL | Freq: Two times a day (BID) | ORAL | Status: DC
  Administered 2016-01-27 – 2016-01-30 (×7): 237 mL via ORAL

## 2016-01-26 MED ORDER — DIPHENHYDRAMINE HCL 12.5 MG/5ML PO ELIX: 25.0000 mg | ORAL_SOLUTION | ORAL | Status: DC | PRN

## 2016-01-26 MED ORDER — ONDANSETRON HCL 4 MG/2ML IJ SOLN: 4.0000 mg | Freq: Once | INTRAMUSCULAR | Status: DC | PRN

## 2016-01-26 MED ORDER — MIDAZOLAM HCL 5 MG/5ML IJ SOLN
INTRAMUSCULAR | Status: DC | PRN
  Administered 2016-01-26: 2 mg via INTRAVENOUS

## 2016-01-26 MED ORDER — 0.9 % SODIUM CHLORIDE (POUR BTL) OPTIME
TOPICAL | Status: DC | PRN
  Administered 2016-01-26: 1000 mL

## 2016-01-26 MED ORDER — POLYETHYLENE GLYCOL 3350 17 G PO PACK: 17.0000 g | PACK | Freq: Every day | ORAL | Status: DC | PRN

## 2016-01-26 MED ORDER — CHLORHEXIDINE GLUCONATE 4 % EX LIQD
60.0000 mL | Freq: Once | CUTANEOUS | Status: DC
Start: 2016-01-26 — End: 2016-01-26

## 2016-01-26 MED ORDER — METOCLOPRAMIDE HCL 5 MG PO TABS: 5.0000 mg | ORAL_TABLET | Freq: Three times a day (TID) | ORAL | Status: DC | PRN

## 2016-01-26 MED ORDER — PROPOFOL 10 MG/ML IV BOLUS
INTRAVENOUS | Status: AC
  Filled 2016-01-26: qty 40

## 2016-01-26 MED ORDER — ALUM & MAG HYDROXIDE-SIMETH 200-200-20 MG/5ML PO SUSP: 30.0000 mL | ORAL | Status: DC | PRN

## 2016-01-26 MED ORDER — GLYCOPYRROLATE 0.2 MG/ML IJ SOLN
INTRAMUSCULAR | Status: DC | PRN
  Administered 2016-01-26: 0.4 mg via INTRAVENOUS

## 2016-01-26 MED ORDER — NORMAL SALINE FLUSH 0.9 % IV SOLN
INTRAVENOUS | Status: DC | PRN
  Administered 2016-01-26: 40 mL

## 2016-01-26 MED ORDER — LABETALOL HCL 5 MG/ML IV SOLN
INTRAVENOUS | Status: AC
  Filled 2016-01-26: qty 4

## 2016-01-26 MED ORDER — HYDROMORPHONE HCL 1 MG/ML IJ SOLN
0.2500 mg | INTRAMUSCULAR | Status: DC | PRN
  Administered 2016-01-26 (×4): 0.5 mg via INTRAVENOUS

## 2016-01-26 MED ORDER — ASPIRIN EC 325 MG PO TBEC
325.0000 mg | DELAYED_RELEASE_TABLET | Freq: Two times a day (BID) | ORAL | Status: DC
  Administered 2016-01-27 – 2016-01-30 (×7): 325 mg via ORAL
  Filled 2016-01-26 (×7): qty 1

## 2016-01-26 SURGICAL SUPPLY — 49 items
BAG DECANTER FOR FLEXI CONT (MISCELLANEOUS) ×2 IMPLANT
CAPT HIP TOTAL 2 ×2 IMPLANT
CELLS DAT CNTRL 66122 CELL SVR (MISCELLANEOUS) ×1 IMPLANT
CLSR STERI-STRIP ANTIMIC 1/2X4 (GAUZE/BANDAGES/DRESSINGS) ×2 IMPLANT
COVER SURGICAL LIGHT HANDLE (MISCELLANEOUS) ×2 IMPLANT
DRAPE C-ARM 42X72 X-RAY (DRAPES) ×2 IMPLANT
DRAPE STERI IOBAN 125X83 (DRAPES) ×2 IMPLANT
DRAPE U-SHAPE 47X51 STRL (DRAPES) ×6 IMPLANT
DRSG AQUACEL AG ADV 3.5X10 (GAUZE/BANDAGES/DRESSINGS) ×2 IMPLANT
DURAPREP 26ML APPLICATOR (WOUND CARE) ×2 IMPLANT
ELECT BLADE 4.0 EZ CLEAN MEGAD (MISCELLANEOUS) ×2
ELECT REM PT RETURN 9FT ADLT (ELECTROSURGICAL) ×2
ELECTRODE BLDE 4.0 EZ CLN MEGD (MISCELLANEOUS) ×1 IMPLANT
ELECTRODE REM PT RTRN 9FT ADLT (ELECTROSURGICAL) ×1 IMPLANT
GLOVE SKINSENSE NS SZ7.5 (GLOVE) ×1
GLOVE SKINSENSE STRL SZ7.5 (GLOVE) ×1 IMPLANT
GLOVE SURG SYN 7.5  E (GLOVE) ×2
GLOVE SURG SYN 7.5 E (GLOVE) ×2 IMPLANT
GOWN SRG XL XLNG 56XLVL 4 (GOWN DISPOSABLE) ×1 IMPLANT
GOWN STRL NON-REIN XL XLG LVL4 (GOWN DISPOSABLE) ×1
GOWN STRL REUS W/ TWL LRG LVL3 (GOWN DISPOSABLE) IMPLANT
GOWN STRL REUS W/TWL LRG LVL3 (GOWN DISPOSABLE)
HANDPIECE INTERPULSE COAX TIP (DISPOSABLE) ×1
HOOD PEEL AWAY FLYTE STAYCOOL (MISCELLANEOUS) ×4 IMPLANT
IV NS 1000ML (IV SOLUTION) ×1
IV NS 1000ML BAXH (IV SOLUTION) ×1 IMPLANT
IV NS IRRIG 3000ML ARTHROMATIC (IV SOLUTION) ×2 IMPLANT
KIT BASIN OR (CUSTOM PROCEDURE TRAY) ×2 IMPLANT
MARKER SKIN DUAL TIP RULER LAB (MISCELLANEOUS) ×2 IMPLANT
NEEDLE SPNL 18GX3.5 QUINCKE PK (NEEDLE) ×2 IMPLANT
PACK TOTAL JOINT (CUSTOM PROCEDURE TRAY) ×2 IMPLANT
PACK UNIVERSAL I (CUSTOM PROCEDURE TRAY) ×2 IMPLANT
RTRCTR WOUND ALEXIS 18CM MED (MISCELLANEOUS) ×2
SAW OSC TIP CART 19.5X105X1.3 (SAW) ×2 IMPLANT
SEALER BIPOLAR AQUA 6.0 (INSTRUMENTS) ×2 IMPLANT
SET HNDPC FAN SPRY TIP SCT (DISPOSABLE) ×1 IMPLANT
STAPLER VISISTAT 35W (STAPLE) IMPLANT
SUT ETHIBOND 2 V 37 (SUTURE) ×2 IMPLANT
SUT ETHIBOND NAB CT1 #1 30IN (SUTURE) ×6 IMPLANT
SUT VIC AB 1 CT1 27 (SUTURE) ×1
SUT VIC AB 1 CT1 27XBRD ANBCTR (SUTURE) ×1 IMPLANT
SUT VIC AB 2-0 CT1 27 (SUTURE) ×1
SUT VIC AB 2-0 CT1 TAPERPNT 27 (SUTURE) ×1 IMPLANT
SYR 20CC LL (SYRINGE) ×2 IMPLANT
SYR 50ML LL SCALE MARK (SYRINGE) ×2 IMPLANT
SYRINGE 60CC LL (MISCELLANEOUS) ×2 IMPLANT
TOWEL OR 17X26 10 PK STRL BLUE (TOWEL DISPOSABLE) ×2 IMPLANT
TRAY CATH 16FR W/PLASTIC CATH (SET/KITS/TRAYS/PACK) ×2 IMPLANT
YANKAUER SUCT BULB TIP NO VENT (SUCTIONS) ×2 IMPLANT

## 2016-01-26 NOTE — Anesthesia Procedure Notes (Signed)
Procedure Name: Intubation Date/Time: 01/26/2016 3:07 PM Performed by: Manus Gunning, Bricyn Labrada J Pre-anesthesia Checklist: Patient identified, Emergency Drugs available, Suction available, Patient being monitored and Timeout performed Patient Re-evaluated:Patient Re-evaluated prior to inductionOxygen Delivery Method: Circle system utilized Preoxygenation: Pre-oxygenation with 100% oxygen Intubation Type: IV induction Ventilation: Mask ventilation without difficulty Laryngoscope Size: Mac and 3 Grade View: Grade I Tube type: Oral Tube size: 7.0 mm Number of attempts: 1 Placement Confirmation: ETT inserted through vocal cords under direct vision,  positive ETCO2 and breath sounds checked- equal and bilateral Secured at: 22 cm Tube secured with: Tape Dental Injury: Teeth and Oropharynx as per pre-operative assessment

## 2016-01-26 NOTE — Anesthesia Preprocedure Evaluation (Addendum)
Anesthesia Evaluation  Patient identified by MRN, date of birth, ID band Patient awake    Reviewed: Allergy & Precautions, NPO status , Patient's Chart, lab work & pertinent test results  History of Anesthesia Complications (+) PONV, Family history of anesthesia reaction and history of anesthetic complications  Airway Mallampati: II  TM Distance: >3 FB Neck ROM: Full    Dental no notable dental hx. (+) Teeth Intact   Pulmonary asthma , sleep apnea ,    Pulmonary exam normal breath sounds clear to auscultation       Cardiovascular hypertension, Pt. on medications Normal cardiovascular exam Rhythm:Regular Rate:Normal     Neuro/Psych  Headaches, PSYCHIATRIC DISORDERS Depression    GI/Hepatic Neg liver ROS, GERD  Medicated and Controlled,  Endo/Other  Morbid obesity  Renal/GU negative Renal ROS  negative genitourinary   Musculoskeletal  (+) Arthritis , Osteoarthritis,  OA Left Hip   Abdominal (+) + obese,   Peds  Hematology  (+) anemia ,   Anesthesia Other Findings   Reproductive/Obstetrics                            Anesthesia Physical Anesthesia Plan  ASA: III  Anesthesia Plan: General   Post-op Pain Management:    Induction: Intravenous  Airway Management Planned: Oral ETT  Additional Equipment:   Intra-op Plan:   Post-operative Plan: Extubation in OR  Informed Consent: I have reviewed the patients History and Physical, chart, labs and discussed the procedure including the risks, benefits and alternatives for the proposed anesthesia with the patient or authorized representative who has indicated his/her understanding and acceptance.   Dental advisory given  Plan Discussed with: CRNA, Anesthesiologist and Surgeon  Anesthesia Plan Comments:         Anesthesia Quick Evaluation

## 2016-01-26 NOTE — Discharge Instructions (Signed)

## 2016-01-26 NOTE — Op Note (Signed)
LEFT TOTAL HIP ARTHROPLASTY ANTERIOR APPROACH  Procedure Note PRIYAL MCLAY   [id number]  Pre-op Diagnosis: Left hip osteoarthritis     Post-op Diagnosis: same   Operative Procedures  1. Total hip replacement; Left hip; uncemented [id number]   Personnel  Surgeon(s): Leandrew Koyanagi, MD   Anesthesia: general  Prosthesis: Depuy Acetabulum: Pinnacle 52 mm Femur: Corail KLA 11 Head: 36 mm size: +1.5 Liner: +4 Bearing Type: Ceramic on poly  Date of Service: 01/26/2016  Total Hip Arthroplasty (Anterior Approach) Op Note:  After informed consent was obtained and the operative extremity marked in the holding area, the patient was brought back to the operating room and placed supine on the HANA table. Next, the operative extremity was prepped and draped in normal sterile fashion. Surgical timeout occurred verifying patient identification, surgical site, surgical procedure and administration of antibiotics.  A modified anterior Smith-Peterson approach to the hip was performed, using the interval between tensor fascia lata and sartorius.  Dissection was carried bluntly down onto the anterior hip capsule. The lateral femoral circumflex vessels were identified and coagulated. A capsulotomy was performed and the capsular flaps tagged for later repair.  Fluoroscopy was utilized to prepare for the femoral neck cut. The neck osteotomy was performed. The femoral head was removed, the acetabular rim was cleared of soft tissue and attention was turned to reaming the acetabulum.  Sequential reaming was performed under fluoroscopic guidance. We reamed to a size 51 mm, and then impacted the acetabular shell. The liner was then placed after irrigation and attention turned to the femur.  After placing the femoral hook, the leg was taken to externally rotated, extended and adducted position taking care to perform soft tissue releases to allow for adequate mobilization of the femur. Soft tissue was cleared from  the shoulder of the greater trochanter and the hook elevator used to improve exposure of the proximal femur. Sequential broaching performed up to a size 11. Trial neck and head were placed. The leg was brought back up to neutral and the construct reduced. The position and sizing of components, offset and leg lengths were checked using fluoroscopy. Stability of the  construct was checked in extension and external rotation without any subluxation or impingement of prosthesis. We dislocated the prosthesis, dropped the leg back into position, removed trial components, and irrigated copiously. The final stem and head was then placed, the leg brought back up, the system reduced and fluoroscopy used to verify positioning.  We irrigated, obtained hemostasis and closed the capsule using #2 ethibond suture.  Dilute betadyne solution was used. The fascia was closed with #1 vicryl plus, the deep fat layer was closed with 0 vicryl, the subcutaneous layers closed with 2.0 Vicryl Plus and the skin closed with 4.0 monocryl and steri strips. A sterile dressing was applied. The patient was awakened in the operating room and taken to recovery in stable condition.  All sponge, needle, and instrument counts were correct at the end of the case.   Position: supine  Complications: none.  Time Out: performed   Drains/Packing: none  Estimated blood loss: 150 cc  Returned to Recovery Room: in good condition.   Antibiotics: yes   Mechanical VTE (DVT) Prophylaxis: sequential compression devices, TED thigh-high  Chemical VTE (DVT) Prophylaxis: aspirin   Fluid Replacement: see anesthesia record  Specimens Removed: 1 to pathology   Sponge and Instrument Count Correct? yes   PACU: portable radiograph - low AP   Admission: inpatient status, start PT &  OT POD#1  Plan/RTC: Return in 2 weeks for staple removal. Return in 6 weeks to see MD.  Weight Bearing/Load Lower Extremity: full  Hip precautions: none Suture Removal:  10-14 days  Betadine to incision twice daily once dressing is removed on POD#7  N. Eduard Roux, MD [place] [phone] 5:03 PM      Implant Name Type Inv. Item Serial No. Manufacturer Lot No. LRB No. Used  PIN SECTOR W/GRIP ACE CUP 52MM - [id number] Hips PIN SECTOR W/GRIP ACE CUP 52MM  DEPUY [id number] Left 1  LINER NEUTRAL 52X36MM PLUS 4 - [id number] Liner LINER NEUTRAL 52X36MM PLUS 4  DEPUY [id number] Left 1  STEM CORAIL KLA11 - [id number] Stem STEM CORAIL KLA11  DEPUY [id number] Left 1  HEAD CERAMIC DELTA 36 PLUS 1.5 - [id number] Hips HEAD CERAMIC DELTA 36 PLUS 1.5   DEPUY [id number] Left 1

## 2016-01-26 NOTE — H&P (Signed)
PREOPERATIVE H&P  Chief Complaint: Left hip osteoarthritis  HPI: Vanessa Medina is a 45 y.o. female who presents for surgical treatment of Left hip osteoarthritis.  She denies any changes in medical history.  Past Medical History  Diagnosis Date  . PONV (postoperative nausea and vomiting)   . Family history of adverse reaction to anesthesia      " my mother takes a long time time wake up"  . Anemia   . Headache     migraine  . Obesity   . Multiple allergies   . History of shingles   . Wears glasses   . Osteoarthritis     left hip  . Hypertension   . Sleep apnea     does not wear CPAP   Past Surgical History  Procedure Laterality Date  . Appendectomy    . Abdominal hysterectomy    . Cholecystectomy    . Knee arthroscopy    . Tonsillectomy    . Laparoscopic gastric sleeve resection with hiatal hernia repair    . Diagnostic laparoscopy      LOA   Social History   Social History  . Marital Status: Legally Separated    Spouse Name: N/A  . Number of Children: N/A  . Years of Education: N/A   Social History Main Topics  . Smoking status: Never Smoker   . Smokeless tobacco: Never Used  . Alcohol Use: Yes     Comment: occasional wine  . Drug Use: No  . Sexual Activity: Not on file   Other Topics Concern  . Not on file   Social History Narrative  . No narrative on file   Family History  Problem Relation Age of Onset  . Renal Disease Mother   . Hypertension Mother   . Stroke Brother   . Diabetes Other    Allergies  Allergen Reactions  . Shellfish Allergy Anaphylaxis  . Meperidine Hcl Other (See Comments)    BRADYCARDIA  . Morphine Other (See Comments)    BRADYCARDIA  . Bee Pollen Other (See Comments)    Unknown  . Ivp Dye [Iodinated Diagnostic Agents] Other (See Comments)    Swelling   . Codeine Nausea And Vomiting  . Oxycodone-Acetaminophen Itching  . Pentazocine Lactate Nausea And Vomiting    REACTION: vomiting with Talwin NX  .  Propoxyphene N-Acetaminophen Nausea And Vomiting   Prior to Admission medications   Medication Sig Start Date End Date Taking? Authorizing Provider  Calcium Carbonate-Vitamin D (CALTRATE 600+D PO) Take 1 tablet by mouth 3 (three) times daily.   Yes Historical Provider, MD  cyanocobalamin (,VITAMIN B-12,) 1000 MCG/ML injection Inject 1,000 mcg into the muscle every 30 (thirty) days.   Yes Historical Provider, MD  Multiple Vitamins-Minerals (MULTIVITAMIN WITH MINERALS) tablet Take 1 tablet by mouth daily.   Yes Historical Provider, MD  pramipexole (MIRAPEX) 0.125 MG tablet Take 0.5 mg by mouth at bedtime. Pt takes 4 tablets at bedtime   Yes Historical Provider, MD  triamterene-hydrochlorothiazide (MAXZIDE-25) 37.5-25 MG tablet Take 1 tablet by mouth daily.   Yes Historical Provider, MD  Vitamin D, Ergocalciferol, (DRISDOL) 50000 units CAPS capsule Take 50,000 Units by mouth every 7 (seven) days. Sundays   Yes Historical Provider, MD     Positive ROS: All other systems have been reviewed and were otherwise negative with the exception of those mentioned in the HPI and as above.  Physical Exam: General: Alert, no acute distress Cardiovascular: No pedal edema Respiratory: No  cyanosis, no use of accessory musculature GI: abdomen soft Skin: No lesions in the area of chief complaint Neurologic: Sensation intact distally Psychiatric: Patient is competent for consent with normal mood and affect Lymphatic: no lymphedema  MUSCULOSKELETAL: exam stable  Assessment: Left hip osteoarthritis  Plan: Plan for Procedure(s): LEFT TOTAL HIP ARTHROPLASTY ANTERIOR APPROACH  The risks benefits and alternatives were discussed with the patient including but not limited to the risks of nonoperative treatment, versus surgical intervention including infection, bleeding, nerve injury,  blood clots, cardiopulmonary complications, morbidity, mortality, among others, and they were willing to proceed.   Marianna Payment, MD   01/26/2016 8:42 AM

## 2016-01-26 NOTE — Transfer of Care (Signed)
Immediate Anesthesia Transfer of Care Note  Patient: Vanessa Medina  Procedure(s) Performed: Procedure(s): LEFT TOTAL HIP ARTHROPLASTY ANTERIOR APPROACH (Left)  Patient Location: PACU  Anesthesia Type:General  Level of Consciousness: awake  Airway & Oxygen Therapy: Patient Spontanous Breathing and Patient connected to nasal cannula oxygen  Post-op Assessment: Report given to RN and Post -op Vital signs reviewed and stable  Post vital signs: Reviewed and stable  Last Vitals:  Filed Vitals:   01/26/16 1117  BP: 134/90  Pulse: 56  Temp: 37.1 C  Resp: 20    Last Pain:  Filed Vitals:   01/26/16 1728  PainSc: 7       Patients Stated Pain Goal: 0 ([id number])  Complications: No apparent anesthesia complications

## 2016-01-27 ENCOUNTER — Encounter (HOSPITAL_COMMUNITY): Payer: Self-pay | Admitting: Orthopaedic Surgery

## 2016-01-27 LAB — BASIC METABOLIC PANEL
Anion gap: 6 (ref 5–15)
BUN: 6 mg/dL (ref 6–20)
CO2: 23 mmol/L (ref 22–32)
Calcium: 7.8 mg/dL — ABNORMAL LOW (ref 8.9–10.3)
Chloride: 106 mmol/L (ref 101–111)
Creatinine, Ser: 0.9 mg/dL (ref 0.44–1.00)
GFR calc Af Amer: >60 mL/min (ref >60)
GFR calc non Af Amer: >60 mL/min (ref >60)
Glucose, Bld: 98 mg/dL (ref 65–99)
Potassium: 3.4 mmol/L — ABNORMAL LOW (ref 3.5–5.1)
Sodium: 135 mmol/L (ref 135–145)

## 2016-01-27 LAB — CBC
HCT: 30.3 % — ABNORMAL LOW (ref 36.0–46.0)
Hemoglobin: 9.8 g/dL — ABNORMAL LOW (ref 12.0–15.0)
MCH: 31.1 pg (ref 26.0–34.0)
MCHC: 32.3 g/dL (ref 30.0–36.0)
MCV: 96.2 fL (ref 78.0–100.0)
Platelets: 237 10*3/uL (ref 150–400)
RBC: 3.15 MIL/uL — ABNORMAL LOW (ref 3.87–5.11)
RDW: 12.8 % (ref 11.5–15.5)
WBC: 7.7 10*3/uL (ref 4.0–10.5)

## 2016-01-27 MED FILL — Sodium Chloride Flush IV Soln 0.9%: INTRAVENOUS | Qty: 40 | Status: AC

## 2016-01-27 NOTE — Progress Notes (Signed)
Occupational Therapy Evaluation Patient Details Name: Vanessa Medina MRN: [id number] DOB: Feb 28, 1971 Today's Date: 01/27/2016    History of Present Illness Vanessa Medina is a 45 y.o. female who presents for surgical treatment of Left hip osteoarthritis. Pt s/p L THR direct anterior approach.   Clinical Impression   PTA, pt independent with ADL and mobility, although LB ADL were difficult. Will follow acutely to address increasing independence with ADL and funcitonal mobility for ADL,Will assess to see if pt will benefit form AE. Pt in agreement.     Follow Up Recommendations  No OT follow up;Supervision - Intermittent    Equipment Recommendations  3 in 1 bedside comode    Recommendations for Other Services       Precautions / Restrictions Precautions Precautions: Fall Precaution Comments: pt reports R knee arthroscopy. pt verbalized her right knee felt like it was giving way during gait. Restrictions Weight Bearing Restrictions: Yes LLE Weight Bearing: Weight bearing as tolerated      Mobility Bed Mobility Overal bed mobility: Needs Assistance Bed Mobility: Supine to Sit     Supine to sit: Min assist     General bed mobility comments: OOB in chair  Transfers Overall transfer level: Needs assistance Equipment used: Rolling walker (2 wheeled) Transfers: Sit to/from Stand Sit to Stand: Min assist         General transfer comment: cues for hand placement    Balance Overall balance assessment: Needs assistance Sitting-balance support: No upper extremity supported Sitting balance-Leahy Scale: Good     Standing balance support: Bilateral upper extremity supported Standing balance-Leahy Scale: Fair                              ADL Overall ADL's : Needs assistance/impaired     Grooming: Set up   Upper Body Bathing: Set up;Sitting   Lower Body Bathing: Moderate assistance;Sit to/from stand   Upper Body Dressing : Set up;Sitting    Lower Body Dressing: Moderate assistance;Sit to/from stand   Toilet Transfer: Minimal assistance;RW;BSC (over toilet)   Toileting- Clothing Manipulation and Hygiene: Min guard;Sit to/from stand       Functional mobility during ADLs: Minimal assistance;Rolling walker General ADL Comments: May benefit from AE     Vision     Perception     Praxis      Pertinent Vitals/Pain Pain Assessment: 0-10 Pain Score: 7  Pain Location: hip Pain Descriptors / Indicators: Aching;Discomfort Pain Intervention(s): Limited activity within patient's tolerance     Hand Dominance Right   Extremity/Trunk Assessment Upper Extremity Assessment Upper Extremity Assessment: Defer to OT evaluation   Lower Extremity Assessment Lower Extremity Assessment: Defer to PT evaluation LLE: Unable to fully assess due to pain   Cervical / Trunk Assessment Cervical / Trunk Assessment: Other exceptions ("buldging discs in back")   Communication Communication Communication: No difficulties   Cognition Arousal/Alertness: Awake/alert Behavior During Therapy: Flat affect Overall Cognitive Status: Within Functional Limits for tasks assessed                     General Comments       Exercises       Shoulder Instructions      Home Living Family/patient expects to be discharged to:: Private residence Living Arrangements: Children Available Help at Discharge: Family;Available PRN/intermittently Type of Home: House Home Access: Stairs to enter [employer] of Steps: 2 Entrance Stairs-Rails: Right;Left;Can reach both Home Layout:  One level     Bathroom Shower/Tub: Tub/shower unit;Curtain Shower/tub characteristics: Industrial/product designer: Yes How Accessible: Accessible via walker Home Equipment: [place] - single point          Prior Functioning/Environment Level of Independence: Independent        Comments: states LB ADL were difficult     OT Diagnosis: Generalized weakness;Acute pain   OT Problem List: Decreased strength;Decreased range of motion;Decreased activity tolerance;Impaired balance (sitting and/or standing);Decreased knowledge of use of DME or AE;Obesity;Pain   OT Treatment/Interventions: Self-care/ADL training;DME and/or AE instruction;Therapeutic activities;Balance training    OT Goals(Current goals can be found in the care plan section) Acute Rehab OT Goals Patient Stated Goal: to go home OT Goal Formulation: With patient Time For Goal Achievement: 02/03/16 Potential to Achieve Goals: Good ADL Goals Pt Will Perform Lower Body Bathing: with set-up;with adaptive equipment;with caregiver independent in assisting;sit to/from stand Pt Will Perform Lower Body Dressing: with set-up;with adaptive equipment;with caregiver independent in assisting;sit to/from stand Pt Will Transfer to Toilet: with supervision;ambulating;bedside commode Pt Will Perform Toileting - Clothing Manipulation and hygiene: with modified independence;sit to/from stand Pt Will Perform Tub/Shower Transfer: with min guard assist;3 in 1;Tub transfer;rolling walker;with caregiver independent in assisting  OT Frequency: Min 2X/week   Barriers to D/C:            Co-evaluation              End of Session Equipment Utilized During Treatment: Gait belt;Rolling walker Nurse Communication: Mobility status;Other (comment) (pt in bathroom)  Activity Tolerance: Patient tolerated treatment well Patient left: Other (comment) (bathroom)   Time: [id number] OT Time Calculation (min): 17 min Charges:  OT General Charges $OT Visit: 1 Procedure OT Evaluation $OT Eval Moderate Complexity: 1 Procedure G-Codes:    Carlee Tesfaye,HILLARY 01-Feb-2016, 11:45 AM   Maurie Boettcher, OTR/L  [phone] 2016-02-01

## 2016-01-27 NOTE — Progress Notes (Signed)
Nutrition Brief Note  Patient identified on the Malnutrition Screening Tool (MST) Report  Wt Readings from Last 15 Encounters:  01/26/16 266 lb 5 oz (120.799 kg)  01/21/16 266 lb 5.1 oz (120.8 kg)  04/28/08 292 lb (132.45 kg)  12/16/07 273 lb 8 oz (124.059 kg)  07/02/07 288 lb (130.636 kg)    Body mass index is 44.64 kg/(m^2). Patient meets criteria for morbid obesity based on current BMI.   Current diet order is regular. No percent meal completion recorded, however pt reports eating fine. She reports she had a gastric sleeve surgery a year and a half ago and usually consumes small frequent meals throughout the day. Weight loss has been gradual. Pt currently has Ensure ordered and would like to continue with them. Labs and medications reviewed.   No further nutrition interventions warranted at this time. If nutrition issues arise, please consult RD.   Corrin Parker, MS, RD, LDN Pager # [phone] After hours/ weekend pager # [phone]

## 2016-01-27 NOTE — Anesthesia Postprocedure Evaluation (Signed)
Anesthesia Post Note  Patient: Vanessa Medina  Procedure(s) Performed: Procedure(s) (LRB): LEFT TOTAL HIP ARTHROPLASTY ANTERIOR APPROACH (Left)  Patient location during evaluation: PACU Anesthesia Type: General Level of consciousness: awake and alert Pain management: pain level controlled Vital Signs Assessment: post-procedure vital signs reviewed and stable Respiratory status: spontaneous breathing, nonlabored ventilation, respiratory function stable and patient connected to nasal cannula oxygen Cardiovascular status: blood pressure returned to baseline and stable Postop Assessment: no signs of nausea or vomiting Anesthetic complications: no    Last Vitals:  Filed Vitals:   01/27/16 0620 01/27/16 1052  BP: 99/48 109/47  Pulse: 58 58  Temp: 36.8 C   Resp: 18 20    Last Pain:  Filed Vitals:   01/27/16 1054  PainSc: 7                  Thoams Siefert,JAMES TERRILL

## 2016-01-27 NOTE — Progress Notes (Signed)
Physical Therapy Treatment Patient Details Name: Vanessa Medina MRN: [id number] DOB: [birth year] Today's Date: 01/27/2016    History of Present Illness Vanessa Medina is a 45 y.o. female who presents for surgical treatment of Left hip osteoarthritis. Pt s/p L THR direct anterior approach.    PT Comments    I attempted BID treatment this afternoon. Pt reported to me that she almost fell in the bathroom when the aide was assisting her. The patient stated her Left knee buckled and she started to fall. Pt reports her left thigh and knee are hurting more now. Pt's nurse entered the room to administer pain meds and the patient discussed the incident with her. Pt's nurse stated she will document the incident. Pt reported she did not want to attempt ambulation or exercises and wanted to return to the bed. Pt was min assist with stand pivot transfer and no signs of either knees buckling during transfer. Pt stated she did not have an issue with her left knee buckling before. She did state she had arthroscopic surgery 25 years ago on her Right knee.Pt will benefit from continued acute PT to facilitate increased independence.  Follow Up Recommendations  Home health PT;Supervision for mobility/OOB     Equipment Recommendations  Rolling walker with 5" wheels    Recommendations for Other Services OT consult     Precautions / Restrictions Precautions Precautions: Fall Precaution Comments: pt reports R knee arthroscopy. pt verbalized her right knee felt like it was giving way during gait. Restrictions Weight Bearing Restrictions: Yes LLE Weight Bearing: Weight bearing as tolerated    Mobility  Bed Mobility Overal bed mobility: Needs Assistance Bed Mobility: Sit to Supine     Supine to sit: Min assist     General bed mobility comments: min asist to lift bilateral LE up into the bed and cues for technique  Transfers Overall transfer level: Needs assistance Equipment used: Rolling walker  (2 wheeled) Transfers: Sit to/from [facility] Sit to Stand: Min assist Stand pivot transfers: Min assist       General transfer comment: cues for hand placement, pt with increase in pain level and anziety when standing. Pt is fearful her knees will buckle and she will fall.  Ambulation/Gait Ambulation/Gait assistance:  (NT) Ambulation Distance (Feet): 30 Feet Assistive device: Rolling walker (2 wheeled) Gait Pattern/deviations: Step-to pattern;Decreased stance time - left;Decreased stride length;Decreased weight shift to left;Decreased step length - right   Gait velocity interpretation: Below normal speed for age/gender General Gait Details: Pt reported her right knee felt like it was going to buckle at times. She had arthoscopy on that knee 25 yrs prior.    Stairs            Wheelchair Mobility    Modified Rankin (Stroke Patients Only)       Balance Overall balance assessment: Needs assistance Sitting-balance support: No upper extremity supported Sitting balance-Leahy Scale: Good     Standing balance support: Bilateral upper extremity supported Standing balance-Leahy Scale: Fair                      Cognition Arousal/Alertness: Awake/alert Behavior During Therapy: Flat affect Overall Cognitive Status: Within Functional Limits for tasks assessed                      Exercises Total Joint Exercises Ankle Circles/Pumps: AROM;Both;10 reps;Supine Long Arc Quad: AROM;Strengthening;Left;10 reps;Seated    General Comments General comments (skin integrity, edema, etc.):  Pt reported she had an incident in the bathroom with the nursing aide. She stated her left knee buckled and started to fall. Tthe pt reported she did not fall on the floor and the aide was able to help her. She is c/o increased pain in Left thigh and knee. RN aware of incident and stated she  will discuss with aide and document.      Pertinent Vitals/Pain Pain  Assessment: 0-10 Pain Score: 8  Pain Location: hip Pain Descriptors / Indicators: Discomfort Pain Intervention(s): Limited activity within patient's tolerance;Repositioned;RN gave pain meds during session    Home Living Family/patient expects to be discharged to:: Private residence Living Arrangements: Children Available Help at Discharge: Family;Available PRN/intermittently Type of Home: House Home Access: Stairs to enter Entrance Stairs-Rails: Right;Left;Can reach both Home Layout: One level Home Equipment: Cane - single point      Prior Function Level of Independence: Independent      Comments: states LB ADL were difficult   PT Goals (current goals can now be found in the care plan section) Acute Rehab PT Goals Patient Stated Goal: to go home PT Goal Formulation: With patient Time For Goal Achievement: 02/03/16 Potential to Achieve Goals: Good Progress towards PT goals: Progressing toward goals    Frequency  7X/week    PT Plan Current plan remains appropriate    Co-evaluation             End of Session Equipment Utilized During Treatment: Gait belt Activity Tolerance: Patient limited by pain Patient left: in bed;with call bell/phone within reach     Time: 1220-1238 PT Time Calculation (min) (ACUTE ONLY): 18 min  Charges:  $Gait Training: 8-22 mins $Therapeutic Activity: 8-22 mins                    G Codes:      Lelon Mast 01/27/2016, 12:43 PM

## 2016-01-27 NOTE — Evaluation (Signed)
Physical Therapy Evaluation Patient Details Name: Vanessa Medina MRN: [id number] DOB: [birth year] Today's Date: 01/27/2016   History of Present Illness  Vanessa Medina is a 45 y.o. female who presents for surgical treatment of Left hip osteoarthritis. Pt s/p L THR direct anterior approach.  Clinical Impression  Pt presents with dependencies in mobility secondary to L THR. Pt ambulated 30' with RW min guard assist and min assist with bed mobility. Pt would benefit from continued skilled PT to maximize mobility and Independence for return home with her son providing assistance as needed. Recommend HHPT and ordering a RW.    Follow Up Recommendations Home health PT;Supervision - Intermittent    Equipment Recommendations  Rolling walker with 5" wheels    Recommendations for Other Services OT consult     Precautions / Restrictions Precautions Precautions: Anterior Hip;Fall Precaution Comments: pt reports R knee arthroscopy. pt verbalized her right knee felt like it was giving way during gait. Restrictions LLE Weight Bearing: Weight bearing as tolerated      Mobility  Bed Mobility Overal bed mobility: Needs Assistance Bed Mobility: Supine to Sit     Supine to sit: Min assist     General bed mobility comments: min assist moving left LE over in the bed and assistance with trunk for initial rise from supine  Transfers Overall transfer level: Needs assistance Equipment used: Rolling walker (2 wheeled) Transfers: Sit to/from Stand Sit to Stand: Min assist         General transfer comment: cues for hand placement  Ambulation/Gait Ambulation/Gait assistance: Min guard Ambulation Distance (Feet): 30 Feet Assistive device: Rolling walker (2 wheeled) Gait Pattern/deviations: Step-to pattern;Decreased stance time - left;Decreased stride length;Decreased weight shift to left;Decreased step length - right   Gait velocity interpretation: Below normal speed for  age/gender General Gait Details: Pt reported her right knee felt like it was going to buckle at times. She had arthoscopy on that knee 25 yrs prior.   Stairs            Wheelchair Mobility    Modified Rankin (Stroke Patients Only)       Balance Overall balance assessment: Needs assistance Sitting-balance support: No upper extremity supported Sitting balance-Leahy Scale: Good     Standing balance support: Bilateral upper extremity supported Standing balance-Leahy Scale: Fair                               Pertinent Vitals/Pain Pain Assessment: 0-10 Pain Score: 7  Pain Descriptors / Indicators: Discomfort Pain Intervention(s): Limited activity within patient's tolerance;Monitored during session;Repositioned    Home Living Family/patient expects to be discharged to:: Private residence Living Arrangements: Children Available Help at Discharge: Family Type of Home: House Home Access: Stairs to enter Entrance Stairs-Rails: Right;Left;Can reach both Technical brewer of Steps: 2 Home Layout: One level Home Equipment: Cane - single point      Prior Function Level of Independence: Independent               Hand Dominance        Extremity/Trunk Assessment   Upper Extremity Assessment: Defer to OT evaluation           Lower Extremity Assessment: LLE deficits/detail         Communication   Communication: No difficulties  Cognition Arousal/Alertness: Awake/alert Behavior During Therapy: Flat affect Overall Cognitive Status: Within Functional Limits for tasks assessed  General Comments      Exercises Total Joint Exercises Ankle Circles/Pumps: AROM;Both;10 reps;Supine Long Arc Quad: AROM;Strengthening;Left;10 reps;Seated      Assessment/Plan    PT Assessment Patient needs continued PT services  PT Diagnosis Difficulty walking;Acute pain   PT Problem List Decreased strength;Decreased activity  tolerance;Decreased balance;Decreased mobility;Decreased range of motion;Decreased knowledge of use of DME;Decreased safety awareness;Pain  PT Treatment Interventions DME instruction;Gait training;Stair training;Functional mobility training;Therapeutic activities;Patient/family education;Balance training;Therapeutic exercise   PT Goals (Current goals can be found in the Care Plan section) Acute Rehab PT Goals Patient Stated Goal: To walk PT Goal Formulation: With patient Time For Goal Achievement: 02/03/16 Potential to Achieve Goals: Good    Frequency 7X/week   Barriers to discharge        Co-evaluation               End of Session Equipment Utilized During Treatment: Gait belt Activity Tolerance: Patient tolerated treatment well Patient left: in chair;with call bell/phone within reach;with family/visitor present Nurse Communication: Mobility status         Time: [id number] PT Time Calculation (min) (ACUTE ONLY): 27 min   Charges:   PT Evaluation $PT Eval Moderate Complexity: 1 Procedure PT Treatments $Gait Training: 8-22 mins   PT G Codes:        Vanessa Medina 01/27/2016, 9:19 AM

## 2016-01-27 NOTE — Progress Notes (Signed)
   Subjective:  Patient reports pain as moderate.    Objective:   VITALS:   Filed Vitals:   01/26/16 1827 01/26/16 1934 01/27/16 0047 01/27/16 0620  BP: 127/69 139/67 110/53 99/48  Pulse: 51 55 67 58  Temp:  97.6 F (36.4 C) 98.5 F (36.9 C) 98.3 F (36.8 C)  TempSrc:  Oral Oral Oral  Resp: 12  18 18   Height:      Weight:      SpO2: 100% 100% 98% 99%    Neurologically intact Neurovascular intact Sensation intact distally Intact pulses distally Dorsiflexion/Plantar flexion intact Incision: dressing C/D/I and no drainage No cellulitis present Compartment soft   Lab Results  Component Value Date   WBC 7.7 01/27/2016   HGB 9.8* 01/27/2016   HCT 30.3* 01/27/2016   MCV 96.2 01/27/2016   PLT 237 01/27/2016     Assessment/Plan:  1 Day Post-Op   - Expected postop acute blood loss anemia - will monitor for symptoms - Up with PT/OT - DVT ppx - SCDs, ambulation, aspirin - WBAT operative extremity - Pain control - Discharge planning  Marianna Payment 01/27/2016, 7:42 AM [phone]

## 2016-01-27 NOTE — Progress Notes (Signed)
Patient states her left knee buckled this afternoon when the Nurse Tech was assisting her from the restroom. I let the patient know that incident would be documented.

## 2016-01-28 ENCOUNTER — Inpatient Hospital Stay (HOSPITAL_COMMUNITY): Payer: BLUE CROSS/BLUE SHIELD

## 2016-01-28 MED ORDER — DIAZEPAM 5 MG PO TABS
5.0000 mg | ORAL_TABLET | Freq: Four times a day (QID) | ORAL | Status: DC | PRN
  Administered 2016-01-28: 10 mg via ORAL
  Administered 2016-01-28: 5 mg via ORAL
  Administered 2016-01-28: 10 mg via ORAL
  Administered 2016-01-29: 5 mg via ORAL
  Administered 2016-01-30: 10 mg via ORAL
  Administered 2016-01-30: 5 mg via ORAL
  Filled 2016-01-28: qty 1
  Filled 2016-01-28 (×2): qty 2
  Filled 2016-01-28: qty 1
  Filled 2016-01-28: qty 2
  Filled 2016-01-28: qty 1

## 2016-01-28 MED ORDER — POTASSIUM CHLORIDE CRYS ER 20 MEQ PO TBCR
40.0000 meq | EXTENDED_RELEASE_TABLET | Freq: Two times a day (BID) | ORAL | Status: AC
  Administered 2016-01-28 (×2): 40 meq via ORAL
  Filled 2016-01-28 (×2): qty 2

## 2016-01-28 MED ORDER — METHOCARBAMOL 750 MG PO TABS
750.0000 mg | ORAL_TABLET | Freq: Four times a day (QID) | ORAL | Status: DC | PRN
  Administered 2016-01-29 – 2016-01-30 (×2): 750 mg via ORAL
  Filled 2016-01-28 (×2): qty 1

## 2016-01-28 MED ORDER — METHOCARBAMOL 1000 MG/10ML IJ SOLN: 500.0000 mg | Freq: Four times a day (QID) | INTRAVENOUS | Status: DC | PRN

## 2016-01-28 NOTE — Progress Notes (Signed)
   Subjective:  Patient mainly c/o left thigh spasms.  Objective:   VITALS:   Filed Vitals:   01/27/16 1052 01/27/16 1354 01/27/16 2145 01/28/16 0450  BP: 109/47 112/60 97/47 102/41  Pulse: 58 68 71 63  Temp:  98.1 F (36.7 C) 98.2 F (36.8 C) 97.8 F (36.6 C)  TempSrc:  Oral Oral Oral  Resp: 20 18 18 18   Height:      Weight:      SpO2:  99% 98% 97%    Neurologically intact Neurovascular intact Sensation intact distally Intact pulses distally Dorsiflexion/Plantar flexion intact Incision: dressing C/D/I and no drainage No cellulitis present Compartment soft   Lab Results  Component Value Date   WBC 7.7 01/27/2016   HGB 9.8* 01/27/2016   HCT 30.3* 01/27/2016   MCV 96.2 01/27/2016   PLT 237 01/27/2016     Assessment/Plan:  2 Days Post-Op   - Expected postop acute blood loss anemia - will monitor for symptoms - Up with PT/OT - DVT ppx - SCDs, ambulation, aspirin - WBAT operative extremity - Pain control - Discharge planning - home tomorrow - valium added for muscle spasms  Marianna Payment 01/28/2016, 7:26 AM [phone]

## 2016-01-28 NOTE — Progress Notes (Signed)
PT Cancellation Note  Patient Details Name: Vanessa Medina MRN: [id number] DOB: 1971/08/21   Cancelled Treatment:    Reason Eval/Treat Not Completed: Fatigue/lethargy limiting ability to participate (had recently ambulated to BR, had Valium, now very drowsy. return in AM. )   [name] 01/28/2016, 4:25 PM

## 2016-01-28 NOTE — Care Management Note (Signed)
Case Management Note  Patient Details  Name: Vanessa Medina MRN: [id number] Date of Birth: 05/13/1971  Subjective/Objective:                 S/p left total hip arthroplasty   Action/Plan: Spoke with patient about discharge plan, gave choice for home health agencies, she selected [facility]. Contacted [facility] and set up HHPT. [facility] delivered rolling walker and 3N1 to patient's room. Gave patient order for a reclining chair which her husband is planning to take to store in case insurance will cover chair rental. Patient stated that her son will be able to assist her after discharge.   Expected Discharge Date:  01/28/16               Expected Discharge Plan:  [place]  In-House Referral:  NA  Discharge planning Services  CM Consult  Post Acute Care Choice:  Durable Medical Equipment, Home Health Choice offered to:  Patient  DME Arranged:  3-N-1, Walker rolling DME Agency:  [place]:  PT [place]:  [place]  Status of Service:  Completed, signed off  Medicare Important Message Given:    Date Medicare IM Given:    Medicare IM give by:    Date Additional Medicare IM Given:    Additional Medicare Important Message give by:     If discussed at [place] of Stay Meetings, dates discussed:    Additional Comments:  Nila Nephew, RN 01/28/2016, 3:27 PM

## 2016-01-28 NOTE — Progress Notes (Signed)
Occupational Therapy Treatment Patient Details Name: SHANARIA FASOLINO MRN: [id number] DOB: 1971-04-14 Today's Date: 01/28/2016    History of present illness BRYER HIROSE is a 45 y.o. female who presents for surgical treatment of Left hip osteoarthritis. Pt s/p L THR direct anterior approach.   OT comments  Pt. Making gains with skilled OT.  Feeling better today.  Able to amb. To b.room. No buckling noted.   performed bed mobility for out of bed that is very high like her bed at home.  Will need tub/shower practice next session and continued bed mobility education for back to bed.    Follow Up Recommendations  No OT follow up;Supervision - Intermittent    Equipment Recommendations  3 in 1 bedside comode    Recommendations for Other Services      Precautions / Restrictions Precautions Precautions: Fall Restrictions LLE Weight Bearing: Weight bearing as tolerated       Mobility Bed Mobility Overal bed mobility: Needs Assistance Bed Mobility: Supine to Sit     Supine to sit: Min assist     General bed mobility comments: hob flat, no rails, exits on L side, min a to guide LLE oob.  pt. with very high bed, height of bed adjusted to mimic home env. will need inst. for back to bed safely.    Transfers Overall transfer level: Needs assistance Equipment used: Rolling walker (2 wheeled) Transfers: Sit to/from [facility] Sit to Stand: Min guard Stand pivot transfers: Min assist       General transfer comment: cues for hand placement, pt with increase in pain level and anziety when standing. Pt is fearful her knees will buckle and she will fall. son followed with recliner for all activity. no buckling noted today    Balance                                   ADL Overall ADL's : Needs assistance/impaired     Grooming: Oral care;Set up;Sitting                   Toilet Transfer: Minimal assistance;RW;Regular  Toilet;BSC;Ambulation Toilet Transfer Details (indicate cue type and reason): 3n1 over the commode Toileting- Clothing Manipulation and Hygiene: Set up;Sit to/from stand       Functional mobility during ADLs: Minimal assistance;Rolling walker General ADL Comments: son was a great help. very active in assisting his mother.  able to follow with recliner during ambulation      Vision                     Perception     Praxis      Cognition   Behavior During Therapy: [facility] for tasks assessed/performed Overall Cognitive Status: Within Functional Limits for tasks assessed                       Extremity/Trunk Assessment               Exercises     Shoulder Instructions       General Comments      Pertinent Vitals/ Pain       Pain Assessment: No/denies pain Pain Location: hip  Home Living  Prior Functioning/Environment              Frequency Min 2X/week     Progress Toward Goals  OT Goals(current goals can now be found in the care plan section)  Progress towards OT goals: Progressing toward goals     Plan Discharge plan remains appropriate    Co-evaluation                 End of Session Equipment Utilized During Treatment: Gait belt;Rolling walker   Activity Tolerance     Patient Left in chair;with call bell/phone within reach;with family/visitor present   Nurse Communication          Time: [phone] OT Time Calculation (min): 31 min  Charges: OT General Charges $OT Visit: 1 Procedure OT Treatments $Self Care/Home Management : 23-37 mins  Janice Coffin, COTA/L 01/28/2016, 10:07 AM

## 2016-01-28 NOTE — Progress Notes (Signed)
Physical Therapy Treatment Patient Details Name: Vanessa Medina MRN: [id number] DOB: 17-Sep-1970 Today's Date: 01/28/2016    History of Present Illness Vanessa Medina is a 45 y.o. female who presents for surgical treatment of Left hip osteoarthritis. Pt s/p L THR direct anterior approach.    PT Comments    The patient had a loud 'Pop" at the Left hip while sitting on the shower seat when she attempted to flex the hip at most  A few inches.  The patient was able to ambulate back to bed. C/o pain was  No more than prior to mobility -"8/10". RN aware. Patient to let RN know if pain escalates. Will   See patient again this PM.   Follow Up Recommendations  Home health PT;Supervision for mobility/OOB     Equipment Recommendations  Rolling walker with 5" wheels    Recommendations for Other Services       Precautions / Restrictions Precautions Precautions: Fall Precaution Comments: Patient had a loud "pop" at the L hip today while sitting in shower. does not  appear to be any side effects Restrictions LLE Weight Bearing: Weight bearing as tolerated    Mobility  Bed Mobility Overal bed mobility: Needs Assistance Bed Mobility: Sit to Supine     Supine to sit: Min assist Sit to supine: Min guard   General bed mobility comments: instructed in use of sheet around the left foot to self assist  placing left leg onto the bed. min guard assist from PT. Extra time and frequent breaks  Transfers Overall transfer level: Needs assistance Equipment used: Rolling walker (2 wheeled) Transfers: Sit to/from Stand Sit to Stand: Min guard Stand pivot transfers: Min assist       General transfer comment: patient  had been sitting on shower seat for at least 15 minutes. PT assisting gently lifting the left leg to place pant leg onto foot. Patient attempted to flex the left hipc and aloud audible "pop' was heard by this PT. the patient was able to continue to mobilize, stand and ambulate x  20'  with RW and get back onto bed using a sheet  around the left foot to self assist the leg onto the bed. patient  had no further increase in pain beyond "8"  as she was already at "8". Assisted patient to partial sidelie on the bed using a wedge and pillows.  Ambulation/Gait Ambulation/Gait assistance: Min guard Ambulation Distance (Feet): 20 Feet Assistive device: Rolling walker (2 wheeled) Gait Pattern/deviations: Step-to pattern;Steppage;Decreased stance time - left;Decreased step length - left         Stairs            Wheelchair Mobility    Modified Rankin (Stroke Patients Only)       Balance                                    Cognition Arousal/Alertness: Awake/alert Behavior During Therapy: WFL for tasks assessed/performed Overall Cognitive Status: Within Functional Limits for tasks assessed                      Exercises      General Comments        Pertinent Vitals/Pain Pain Assessment: No/denies pain Pain Score: 8  Pain Location: L hip Pain Descriptors / Indicators: Aching;Cramping;Crying;Discomfort;Grimacing;Guarding Pain Intervention(s): Premedicated before session;Repositioned;Monitored during session;Limited activity within patient's tolerance    Home  Living                      Prior Function            PT Goals (current goals can now be found in the care plan section) Progress towards PT goals: Progressing toward goals    Frequency  7X/week    PT Plan Current plan remains appropriate    Co-evaluation             End of Session   Activity Tolerance: Patient limited by pain Patient left: in bed;with call bell/phone within reach;with family/visitor present     Time: [id number] PT Time Calculation (min) (ACUTE ONLY): 42 min  Charges:  $Gait Training: 8-22 mins $Therapeutic Activity: 8-22 mins $Self Care/Home Management: 8-22                    G Codes:      [name] 01/28/2016, 1:07 PM Tresa Endo PT [phone]

## 2016-01-29 NOTE — Progress Notes (Signed)
Orthopedic Tech Progress Note Patient Details:  Vanessa Medina July 10, 1971 [id number]  Ortho Devices Type of Ortho Device: Crutches Ortho Device/Splint Interventions: Ordered, Adjustment   Braulio Bosch 01/29/2016, 3:50 PM

## 2016-01-29 NOTE — Progress Notes (Signed)
Subjective: Patient stable states that she feels very tired   Objective: Vital signs in last 24 hours: Temp:  [97.9 F (36.6 C)-98.8 F (37.1 C)] 97.9 F (36.6 C) (05/27 0403) Pulse Rate:  [67-88] 71 (05/27 0403) Resp:  [16-18] 18 (05/27 0403) BP: (101-109)/(45-55) 101/49 mmHg (05/27 0403) SpO2:  [94 %-96 %] 94 % (05/27 0403)  Intake/Output from previous day: 05/26 0701 - 05/27 0700 In: 3808.8 [P.O.:840; I.V.:2968.8] Out: -  Intake/Output this shift:    Exam:  Sensation intact distally Intact pulses distally Dorsiflexion/Plantar flexion intact  Labs:  Recent Labs  01/27/16 0544  HGB 9.8*    Recent Labs  01/27/16 0544  WBC 7.7  RBC 3.15*  HCT 30.3*  PLT 237    Recent Labs  01/27/16 0544  NA 135  K 3.4*  CL 106  CO2 23  BUN 6  CREATININE 0.90  GLUCOSE 98  CALCIUM 7.8*   No results for input(s): LABPT, INR in the last 72 hours.  Assessment/Plan: Patient will be slow to mobilize.  Continue physical therapy today we'll see how she does this weekend in terms of discharge planning   Brycen Bean SCOTT 01/29/2016, 8:03 AM

## 2016-01-29 NOTE — Progress Notes (Signed)
Physical Therapy Treatment Patient Details Name: Vanessa Medina MRN: [id number] DOB: 1971-08-28 Today's Date: 01/29/2016    History of Present Illness Vanessa Medina is a 45 y.o. female who presents for surgical treatment of Left hip osteoarthritis. Pt s/p L THR direct anterior approach.    PT Comments    The patient tolerated practice of steps today. Requires extra time. Pain escalates to 9 per patient. Patient c/o dizziness and  Nausea after return to room. The  RN in to  Assist and monitor BP. The patient will benefit from fruther PT while in acute care. May be  Ready for DC tomorrow.   Follow Up Recommendations  Home health PT;Supervision for mobility/OOB     Equipment Recommendations       Recommendations for Other Services       Precautions / Restrictions Precautions Precautions: Fall Restrictions Weight Bearing Restrictions: Yes LLE Weight Bearing: Weight bearing as tolerated    Mobility  Bed Mobility Overal bed mobility: Needs Assistance Bed Mobility: Supine to Sit     Supine to sit: Min assist     General bed mobility comments: instructed in use of sheet around the left foot to self assist  sliding the leg to the edge, used bed rail. patient reports that she is getting a lift chair.  Transfers Overall transfer level: Needs assistance Equipment used: Rolling walker (2 wheeled) Transfers: Sit to/from [facility] Sit to Stand: Min guard Stand pivot transfers: Min guard       General transfer comment: able to stand without assistance from therapist today. stand and pivot to recliner.   Ambulation/Gait Ambulation/Gait assistance: Min guard           General Gait Details: not tested, fatigued from sep training   Stairs Stairs: Yes Stairs assistance: +2 safety/equipment Stair Management: With walker;With crutches;One rail Right;Step to pattern;Forwards;Backwards Number of Stairs: 2 General stair comments: instructed in use of  left crutch and right rail. Instructed in backward with RW technique and paracticed. will demonstrate to son when he arrives.  Wheelchair Mobility    Modified Rankin (Stroke Patients Only)       Balance                                    Cognition Arousal/Alertness: Awake/alert                          Exercises      General Comments        Pertinent Vitals/Pain Pain Score: 8  Pain Location: L hip Pain Descriptors / Indicators: Aching;Cramping;Discomfort;Grimacing Pain Intervention(s): Limited activity within patient's tolerance;Monitored during session;Premedicated before session;Repositioned    Home Living                      Prior Function            PT Goals (current goals can now be found in the care plan section) Progress towards PT goals: Progressing toward goals    Frequency  7X/week    PT Plan Current plan remains appropriate    Co-evaluation             End of Session Equipment Utilized During Treatment: Gait belt Activity Tolerance: Patient tolerated treatment well Patient left: in chair;with call bell/phone within reach;with nursing/sitter in room     Time: [id number] PT Time Calculation (min) (  ACUTE ONLY): 37 min  Charges:  $Therapeutic Activity: 8-22 mins $Self Care/Home Management: 8-22                    G Codes:      [name] 01/29/2016, 11:53 AM Tresa Endo PT [phone]

## 2016-01-29 NOTE — Progress Notes (Signed)
Physical Therapy Treatment Patient Details Name: Vanessa Medina MRN: [id number] DOB: 06-07-1971 Today's Date: 01/29/2016    History of Present Illness  45 y.o. s/p L THR direct anterior approach. PMH includes obesity, OA, anemia, and HTN.     PT Comments    Reviewed use of leg lifter and where available if it is useful. The patient had recently returned from having a shower so reviewed the exercises and n. Earlier today, the son was instructed in  Negotiating steps with RW and  With crutch and rail. Patient has DME and plans for DC tomorrow.  Follow Up Recommendations  Home health PT;Supervision for mobility/OOB     Equipment Recommendations  Rolling walker with 5" wheels    Recommendations for Other Services       Precautions / Restrictions Precautions Precautions: Fall    Mobility  Bed Mobility                  Transfers                    Ambulation/Gait                 Stairs            Wheelchair Mobility    Modified Rankin (Stroke Patients Only)       Balance                                    Cognition Arousal/Alertness: Awake/alert                          Exercises Total Joint Exercises Ankle Circles/Pumps: AROM;Left;Right;10 reps Heel Slides: AAROM;Left;5 reps Hip ABduction/ADduction: AAROM;Left;5 reps    General Comments        Pertinent Vitals/Pain Pain Score: 5  Pain Location: L leg Pain Descriptors / Indicators: Aching Pain Intervention(s): Limited activity within patient's tolerance;Monitored during session;Premedicated before session    Home Living                      Prior Function            PT Goals (current goals can now be found in the care plan section) Progress towards PT goals: Progressing toward goals    Frequency  7X/week    PT Plan Current plan remains appropriate    Co-evaluation             End of Session   Activity Tolerance:  Patient tolerated treatment well Patient left: in chair;with call bell/phone within reach     Time: [id number] PT Time Calculation (min) (ACUTE ONLY): 12 min  Charges:  $Therapeutic Exercise: 8-22 mins                    G Codes:      Vanessa Medina 01/29/2016, 5:26 PM

## 2016-01-29 NOTE — Progress Notes (Addendum)
Occupational Therapy Treatment Patient Details Name: Vanessa Medina MRN: [id number] DOB: Mar 08, 1971 Today's Date: 01/29/2016    History of present illness  45 y.o. s/p L THR direct anterior approach. PMH includes obesity, OA, anemia, and HTN.    OT comments  Pt progressing. Education provided in session. Plan to see pt for another session prior to d/c home. Family plans to order pt tub bench.  Follow Up Recommendations  No OT follow up;Supervision - Intermittent    Equipment Recommendations  3 in 1 bedside comode;Other (comment) (AE)    Recommendations for Other Services      Precautions / Restrictions Precautions Precautions: Fall Restrictions Weight Bearing Restrictions: Yes LLE Weight Bearing: Weight bearing as tolerated       Mobility Bed Mobility    General bed mobility comments: not assessed  Transfers Overall transfer level: Needs assistance Equipment used: Rolling walker (2 wheeled) Transfers: Sit to/from Stand Sit to Stand: Min guard       Balance      Able to perform functional tasks while standing without LOB.                              ADL Overall ADL's : Needs assistance/impaired     Grooming: Wash/dry hands;Supervision/safety;Standing               Lower Body Dressing: Set up;Supervision/safety;With adaptive equipment;Sitting/lateral leans Lower Body Dressing Details (indicate cue type and reason): donned/doffed sock Toilet Transfer: Min guard;Ambulation;Grab bars;RW (3 in 1 over commode)   Toileting- Clothing Manipulation and Hygiene: Supervision/safety;Sit to/from stand       Functional mobility during ADLs: Min guard;Rolling walker General ADL Comments: Educated on options for shower chair and tub transfer technique. Educated on safety such as use of bag on walker. Educated on LB dressing technique. Educated on AE.      Vision                     Perception     Praxis      Cognition   Awake/Alert Behavior During Therapy: WFL for tasks assessed/performed Overall Cognitive Status: Within Functional Limits for tasks assessed                       Extremity/Trunk Assessment               Exercises     Shoulder Instructions       General Comments      Pertinent Vitals/ Pain       Pain Assessment: 0-10 Pain Score:  (5 towards beginning and 8 walking) Pain Location: LLE and back Pain Descriptors / Indicators: Burning;Cramping Pain Intervention(s): Monitored during session;Repositioned  Home Living                                          Prior Functioning/Environment              Frequency Min 2X/week     Progress Toward Goals  OT Goals(current goals can now be found in the care plan section)  Progress towards OT goals: Progressing toward goals  Acute Rehab OT Goals Patient Stated Goal: not stated OT Goal Formulation: With patient Time For Goal Achievement: 02/03/16 Potential to Achieve Goals: Good ADL Goals Pt Will Perform Lower Body Bathing: with set-up;with adaptive  equipment;with caregiver independent in assisting;sit to/from stand Pt Will Perform Lower Body Dressing: with set-up;with adaptive equipment;with caregiver independent in assisting;sit to/from stand Pt Will Transfer to Toilet: with supervision;ambulating;bedside commode Pt Will Perform Toileting - Clothing Manipulation and hygiene: with modified independence;sit to/from stand Pt Will Perform Tub/Shower Transfer: with min guard assist;3 in 1;Tub transfer;rolling walker;with caregiver independent in assisting  Plan Discharge plan remains appropriate;Equipment recommendations need to be updated    Co-evaluation                 End of Session Equipment Utilized During Treatment: Rolling walker;Other (comment) (AE)   Activity Tolerance Patient tolerated treatment well   Patient Left in chair;with call bell/phone within reach;with family/visitor  present   Nurse Communication          Time: [phone] (some time spent urinating) OT Time Calculation (min): 21 min  Charges: OT General Charges $OT Visit: 1 Procedure OT Treatments $Self Care/Home Management : 8-22 mins  Benito Mccreedy OTR/L [id number] 01/29/2016, 1:22 PM

## 2016-01-30 NOTE — Progress Notes (Addendum)
Occupational Therapy Treatment Patient Details Name: Vanessa Medina MRN: [id number] DOB: 1971-03-07 Today's Date: 01/30/2016    History of present illness  45 y.o. s/p L THR direct anterior approach. PMH includes obesity, OA, anemia, and HTN.    OT comments  Education provided in session. OT signing off.   Follow Up Recommendations  No OT follow up;Supervision - Intermittent    Equipment Recommendations  3 in 1 bedside comode;Other (comment);Tub/shower bench (AE)    Recommendations for Other Services      Precautions / Restrictions Precautions Precautions: Fall Restrictions Weight Bearing Restrictions: Yes LLE Weight Bearing: Weight bearing as tolerated       Mobility Bed Mobility               General bed mobility comments: not assessed  Transfers Overall transfer level: Needs assistance Equipment used: Rolling walker (2 wheeled) Transfers: Sit to/from Stand Sit to Stand: see comments          General transfer comment: Min A to hold tub bench steady when pt went to stand, otherwise supervision level for sit to stand transfers.    Balance      No LOB with ambulation using RW. Performed functional tasks while standing without LOB.                             ADL Overall ADL's : Needs assistance/impaired     Grooming: Wash/dry hands;Supervision/safety;Standing               Lower Body Dressing: Supervision/safety;Set up;With adaptive equipment;Sit to/from stand   Toilet Transfer: Supervision/safety;Ambulation;RW;Grab bars (3 in 1 over commode; pt sitting in bathroom when OT arrived) Toilet Transfer Details (indicate cue type and reason): 3 in 1 already set up over toilet     Tub/ Shower Transfer: Tub transfer;Ambulation;Rolling walker;Tub bench;Moderate assistance Tub/Shower Transfer Details (indicate cue type and reason): assist with LLE and holding tub bench steady Functional mobility during ADLs: Rolling walker (Mod  A-simulated tub transfer; Supervision for walking) General ADL Comments: Educated on safety such as safe footwear, using bag on walker and sitting for LB bathing. Educated on tub transfer technique.  Reviewed LB dressing technique.  Educated on options for shower chair and pt wanting a tub bench. Educated on options for shower chair and pt wanting a tub bench.      Vision                     Perception     Praxis      Cognition  Awake/Alert Behavior During Therapy: WFL for tasks assessed/performed Overall Cognitive Status: Within Functional Limits for tasks assessed                       Extremity/Trunk Assessment               Exercises     Shoulder Instructions       General Comments      Pertinent Vitals/ Pain       Pain Assessment: 0-10 Pain Score:  (8.5) Pain Location: Lt groin/LLE and head Pain Descriptors / Indicators: Headache Pain Intervention(s): Monitored during session  Home Living                                          Prior Functioning/Environment  Frequency Min 2X/week     Progress Toward Goals  OT Goals(current goals can now be found in the care plan section)  Progress towards OT goals: Progressing toward goals-adequate for d/c  Acute Rehab OT Goals Patient Stated Goal: go home OT Goal Formulation: With patient Time For Goal Achievement: 02/03/16 Potential to Achieve Goals: Good ADL Goals Pt Will Perform Lower Body Bathing: with set-up;with adaptive equipment;with caregiver independent in assisting;sit to/from stand Pt Will Perform Lower Body Dressing: with set-up;with adaptive equipment;with caregiver independent in assisting;sit to/from stand Pt Will Transfer to Toilet: with supervision;ambulating;bedside commode Pt Will Perform Toileting - Clothing Manipulation and hygiene: with modified independence;sit to/from stand Pt Will Perform Tub/Shower Transfer: with min guard assist;3 in  1;Tub transfer;rolling walker;with caregiver independent in assisting  Plan Discharge plan remains appropriate;Equipment recommendations need to be updated    Co-evaluation                 End of Session Equipment Utilized During Treatment: Rolling walker;Other (comment) (AE)   Activity Tolerance Patient tolerated treatment well;Other (comment) (reported little dizziness)   Patient Left with call bell/phone within reach;in bed;with bed alarm set (sitting EOB)   Nurse Communication Other (comment) (tub bench)        Time: [id number] OT Time Calculation (min): 19 min  Charges: OT General Charges $OT Visit: 1 Procedure OT Treatments $Self Care/Home Management : 8-22 mins   Benito Mccreedy OTR/L [id number] 01/30/2016, 9:40 AM

## 2016-01-30 NOTE — Progress Notes (Signed)
Pt discharge education and instructions completed with pt at bedside; pt voices understanding and denies any questions. Pt IV removed; pt discharge home with family to transport her home. Pt handed her prescriptions for aspirin, norco, robaxin, zofran and senna. Pt home use equipments including tub bath bench delivered to pt at bedside. Pt transported off unit via wheelchair with belongings to the side. Pt pre-medicated with pain meds prior to discharge. Delia Heady RN

## 2016-01-30 NOTE — Discharge Summary (Signed)
Physician Discharge Summary  Patient ID: Vanessa Medina MRN: [id number] DOB/AGE: [birth year] 45 y.o.  Admit date: 01/26/2016 Discharge date: 01/30/2016  Admission Diagnoses:Osteoarthritis hip  Discharge Diagnoses:  Active Problems:   Hip joint replacement status   Discharged Condition: stable  Hospital Course: Patient's hospital course was generally unremarkable. She underwent total hip arthroplasty. Postoperatively she progressed well and was discharged to home in stable condition.  Consults: None  Significant Diagnostic Studies: labs: Routine labs  Treatments: surgery: See operative note  Discharge Exam: Blood pressure 119/55, pulse 77, temperature 98.8 F (37.1 C), temperature source Oral, resp. rate 18, height 5' 4.75" (1.645 m), weight 120.799 kg (266 lb 5 oz), SpO2 98 %. Incision/Wound: Dressing clean and dry  Disposition:  home     Medication List    TAKE these medications        aspirin EC 325 MG tablet  Take 1 tablet (325 mg total) by mouth 2 (two) times daily.     HYDROcodone-acetaminophen 10-325 MG tablet  Commonly known as:  NORCO  Take 1-2 tablets by mouth every 6 (six) hours as needed for moderate pain.     methocarbamol 750 MG tablet  Commonly known as:  ROBAXIN  Take 1 tablet (750 mg total) by mouth 2 (two) times daily as needed for muscle spasms.     ondansetron 4 MG tablet  Commonly known as:  ZOFRAN  Take 1-2 tablets (4-8 mg total) by mouth every 8 (eight) hours as needed for nausea or vomiting.     senna-docusate 8.6-50 MG tablet  Commonly known as:  SENOKOT S  Take 1 tablet by mouth at bedtime as needed.      ASK your doctor about these medications        CALTRATE 600+D PO  Take 1 tablet by mouth 3 (three) times daily.     cyanocobalamin 1000 MCG/ML injection  Commonly known as:  (VITAMIN B-12)  Inject 1,000 mcg into the muscle every 30 (thirty) days.     multivitamin with minerals tablet  Take 1 tablet by mouth daily.     pramipexole 0.125 MG tablet  Commonly known as:  MIRAPEX  Take 0.5 mg by mouth at bedtime. Pt takes 4 tablets at bedtime     triamterene-hydrochlorothiazide 37.5-25 MG tablet  Commonly known as:  MAXZIDE-25  Take 1 tablet by mouth daily.     Vitamin D (Ergocalciferol) 50000 units Caps capsule  Commonly known as:  DRISDOL  Take 50,000 Units by mouth every 7 (seven) days. Sundays           Follow-up Information    Follow up with Marianna Payment, MD In 2 weeks.   Specialty:  Orthopedic Surgery   Why:  For suture removal, For wound re-check   Contact information:   [place] [phone]       Follow up with [facility].   Why:  They will contact you to schedule home therapy visits.   Contact information:   [address] [place] [address] [phone]       Signed: Newt Minion 01/30/2016, 8:41 AM

## 2016-01-30 NOTE — Progress Notes (Signed)
In to see pt for PT treatment. Pt already packed up and waiting for nursing to bring paperwork. Verbally reviewed stair technique with pt/son. Both without any other questions for PT. Pt planning to get blue leg lifter on way out at discharge.   Willow Ora, PTA, CLT Acute Rehab Services [phone] 01/30/2016, 12:15 PM

## 2016-03-22 ENCOUNTER — Other Ambulatory Visit: Payer: Self-pay | Admitting: Obstetrics and Gynecology

## 2016-03-22 DIAGNOSIS — Z1231 Encounter for screening mammogram for malignant neoplasm of breast: Secondary | ICD-10-CM

## 2016-04-11 ENCOUNTER — Encounter: Payer: Self-pay | Admitting: Obstetrics and Gynecology

## 2016-04-17 ENCOUNTER — Other Ambulatory Visit: Payer: Self-pay | Admitting: Obstetrics and Gynecology

## 2016-04-17 ENCOUNTER — Ambulatory Visit
Admission: RE | Admit: 2016-04-17 | Discharge: 2016-04-17 | Disposition: A | Discharge status: Home / Self Care | Payer: BLUE CROSS/BLUE SHIELD | Source: Ambulatory Visit | Attending: Obstetrics and Gynecology | Admitting: Obstetrics and Gynecology

## 2016-04-17 DIAGNOSIS — Z1231 Encounter for screening mammogram for malignant neoplasm of breast: Secondary | ICD-10-CM

## 2016-04-17 HISTORY — DX: Malignant neoplasm of cervix uteri, unspecified: C53.9

## 2016-07-04 ENCOUNTER — Other Ambulatory Visit (INDEPENDENT_AMBULATORY_CARE_PROVIDER_SITE_OTHER): Payer: Self-pay | Admitting: Orthopaedic Surgery

## 2016-07-04 NOTE — Telephone Encounter (Signed)
Pt. Called stating she was having dental work done 11/6 and is needing an antibiotic prescribed. Pt. Uses [facility] in [place].

## 2016-07-05 NOTE — Telephone Encounter (Signed)
Please advise 

## 2016-07-07 ENCOUNTER — Telehealth (INDEPENDENT_AMBULATORY_CARE_PROVIDER_SITE_OTHER): Payer: Self-pay

## 2016-07-07 ENCOUNTER — Other Ambulatory Visit (INDEPENDENT_AMBULATORY_CARE_PROVIDER_SITE_OTHER): Payer: Self-pay | Admitting: Orthopaedic Surgery

## 2016-07-07 MED ORDER — AMOXICILLIN 500 MG PO CAPS: 2000.0000 mg | ORAL_CAPSULE | ORAL | 0 refills | Status: DC

## 2016-07-07 NOTE — Addendum Note (Signed)
Addended by: Teresa Coombs D on: 07/07/2016 10:45 AM   Modules accepted: Orders

## 2016-07-07 NOTE — Telephone Encounter (Signed)
rx has been sent 

## 2016-07-07 NOTE — Telephone Encounter (Signed)
Pt. Called stating she was having dental work done 11/6 and is needing an antibiotic prescribed. Pt. Uses [facility] in [place].     Per Dr Erlinda Hong he said okay to send in Amoxicillin 2g

## 2016-07-07 NOTE — Telephone Encounter (Signed)
Called patient to let her know Rx for amoxicillin was sent in

## 2016-07-07 NOTE — Telephone Encounter (Signed)
Amoxicillin 2g 30 min prior sent to pharmacy.

## 2016-07-24 ENCOUNTER — Inpatient Hospital Stay (INDEPENDENT_AMBULATORY_CARE_PROVIDER_SITE_OTHER): Payer: Self-pay | Admitting: Orthopaedic Surgery

## 2016-08-02 ENCOUNTER — Other Ambulatory Visit (INDEPENDENT_AMBULATORY_CARE_PROVIDER_SITE_OTHER): Payer: Self-pay | Admitting: Orthopaedic Surgery

## 2016-08-07 ENCOUNTER — Inpatient Hospital Stay (INDEPENDENT_AMBULATORY_CARE_PROVIDER_SITE_OTHER): Payer: Self-pay | Admitting: Orthopaedic Surgery

## 2016-08-11 ENCOUNTER — Inpatient Hospital Stay (INDEPENDENT_AMBULATORY_CARE_PROVIDER_SITE_OTHER): Payer: Self-pay | Admitting: Orthopaedic Surgery

## 2016-09-01 ENCOUNTER — Encounter (INDEPENDENT_AMBULATORY_CARE_PROVIDER_SITE_OTHER): Payer: Self-pay

## 2016-09-01 ENCOUNTER — Ambulatory Visit (INDEPENDENT_AMBULATORY_CARE_PROVIDER_SITE_OTHER): Payer: BLUE CROSS/BLUE SHIELD | Admitting: Orthopaedic Surgery

## 2016-09-01 ENCOUNTER — Encounter (INDEPENDENT_AMBULATORY_CARE_PROVIDER_SITE_OTHER): Payer: Self-pay | Admitting: Orthopaedic Surgery

## 2016-09-01 ENCOUNTER — Ambulatory Visit (INDEPENDENT_AMBULATORY_CARE_PROVIDER_SITE_OTHER): Payer: Self-pay

## 2016-09-01 DIAGNOSIS — Z96642 Presence of left artificial hip joint: Secondary | ICD-10-CM

## 2016-09-01 NOTE — Progress Notes (Signed)
Office Visit Note   Patient: Vanessa Medina           Date of Birth: Mar 30, 1971           MRN: [id number] Visit Date: 09/01/2016              Requested by: No referring provider defined for this encounter. PCP: BEAN,BILLIE-LYNN, PA-C (Inactive)   Assessment & Plan: Visit Diagnoses:  1. History of total hip replacement, left     Plan: From a hip replacement standpoint everything looks good I don't see any issues. I think she just contused her ASIS. I would like to see her back in about a year for follow-up she'll need a standing AP pelvis at that time.  Follow-Up Instructions: Return in about 1 year (around 09/01/2017) for recheck left THA.   Orders:  Orders Placed This Encounter  Procedures  . XR HIP UNILAT W OR W/O PELVIS 2-3 VIEWS LEFT   No orders of the defined types were placed in this encounter.     Procedures: No procedures performed   Clinical Data: No additional findings.   Subjective: Chief Complaint  Patient presents with  . Left Hip - Follow-up    S/p left total hip replacement 01/26/16    The patient is 7 months status post left total hip replacement. She is doing well. She did have a fall onto her left side last month but she is been able to weight-bear. She still is tender over the anterior aspect of her hip region. She denies any mechanical symptoms with her hip. She's been taking Robaxin as needed. She mainly feels like it is sore.    Review of Systems   Objective: Vital Signs: There were no vitals taken for this visit.  Physical Exam  Ortho Exam Exam of the left hip shows good range of motion of the hip without any apparent mechanical issues. She is mainly tender over the anterior superior iliac spine of her pelvis. The incision is nicely healed. There is no bruising or swelling. Specialty Comments:  No specialty comments available.  Imaging: Xr Hip Unilat W Or W/o Pelvis 2-3 Views Left  Result Date: 09/01/2016 Stable left hip  replacement    PMFS History: Patient Active Problem List   Diagnosis Date Noted  . History of total hip replacement, left 01/26/2016  . EDEMA 04/28/2008  . LEG PAIN, BILATERAL 12/16/2007  . CERVICAL CANCER 07/02/2007  . MORBID OBESITY 07/02/2007  . DEPRESSION 07/02/2007  . COMMON MIGRAINE 07/02/2007  . ALLERGIC RHINITIS 07/02/2007  . ASTHMA 07/02/2007  . GERD 07/02/2007  . ELEVATED BLOOD PRESSURE WITHOUT DIAGNOSIS OF HYPERTENSION 07/02/2007   Past Medical History:  Diagnosis Date  . Anemia   . Cervical cancer ([place])   . Family history of adverse reaction to anesthesia     " my mother takes a long time time wake up"  . Headache    migraine  . History of shingles   . Hypertension   . Multiple allergies   . Obesity   . Osteoarthritis    left hip  . PONV (postoperative nausea and vomiting)   . Sleep apnea    does not wear CPAP  . Wears glasses     Family History  Problem Relation Age of Onset  . Renal Disease Mother   . Hypertension Mother   . Stroke Brother   . Diabetes Other   . Breast cancer Cousin     paternal side    Past  Surgical History:  Procedure Laterality Date  . ABDOMINAL HYSTERECTOMY    . APPENDECTOMY    . CHOLECYSTECTOMY    . DIAGNOSTIC LAPAROSCOPY     LOA  . KNEE ARTHROSCOPY    . LAPAROSCOPIC GASTRIC SLEEVE RESECTION WITH HIATAL HERNIA REPAIR    . TONSILLECTOMY    . TOTAL HIP ARTHROPLASTY Left 01/26/2016   Procedure: LEFT TOTAL HIP ARTHROPLASTY ANTERIOR APPROACH;  Surgeon: Leandrew Koyanagi, MD;  Location: [facility];  Service: Orthopedics;  Laterality: Left;   Social History   Occupational History  . Not on file.   Social History Main Topics  . Smoking status: Never Smoker  . Smokeless tobacco: Never Used  . Alcohol use Yes     Comment: occasional wine  . Drug use: No  . Sexual activity: Not on file

## 2016-09-13 DIAGNOSIS — G43709 Chronic migraine without aura, not intractable, without status migrainosus: Secondary | ICD-10-CM | POA: Diagnosis not present

## 2016-09-13 DIAGNOSIS — I10 Essential (primary) hypertension: Secondary | ICD-10-CM | POA: Diagnosis not present

## 2016-10-02 DIAGNOSIS — J069 Acute upper respiratory infection, unspecified: Secondary | ICD-10-CM | POA: Diagnosis not present

## 2016-10-25 DIAGNOSIS — I10 Essential (primary) hypertension: Secondary | ICD-10-CM | POA: Diagnosis not present

## 2017-01-28 ENCOUNTER — Encounter: Payer: Self-pay | Admitting: Emergency Medicine

## 2017-01-28 ENCOUNTER — Emergency Department: Payer: 59

## 2017-01-28 ENCOUNTER — Emergency Department
Admission: EM | Admit: 2017-01-28 | Discharge: 2017-01-28 | Disposition: A | Discharge status: Home / Self Care | Payer: 59 | Attending: Emergency Medicine | Admitting: Emergency Medicine

## 2017-01-28 DIAGNOSIS — Z7982 Long term (current) use of aspirin: Secondary | ICD-10-CM | POA: Insufficient documentation

## 2017-01-28 DIAGNOSIS — C539 Malignant neoplasm of cervix uteri, unspecified: Secondary | ICD-10-CM | POA: Insufficient documentation

## 2017-01-28 DIAGNOSIS — R51 Headache: Secondary | ICD-10-CM | POA: Insufficient documentation

## 2017-01-28 DIAGNOSIS — Z79899 Other long term (current) drug therapy: Secondary | ICD-10-CM | POA: Insufficient documentation

## 2017-01-28 DIAGNOSIS — J45909 Unspecified asthma, uncomplicated: Secondary | ICD-10-CM | POA: Insufficient documentation

## 2017-01-28 DIAGNOSIS — R519 Headache, unspecified: Secondary | ICD-10-CM

## 2017-01-28 DIAGNOSIS — Z96642 Presence of left artificial hip joint: Secondary | ICD-10-CM | POA: Diagnosis not present

## 2017-01-28 DIAGNOSIS — I10 Essential (primary) hypertension: Secondary | ICD-10-CM | POA: Insufficient documentation

## 2017-01-28 HISTORY — DX: Migraine, unspecified, not intractable, without status migrainosus: G43.909

## 2017-01-28 LAB — CBC WITH DIFFERENTIAL/PLATELET
Basophils Absolute: 0 10*3/uL (ref 0–0.1)
Basophils Relative: 1 %
Eosinophils Absolute: 0.1 10*3/uL (ref 0–0.7)
Eosinophils Relative: 1 %
HCT: 38.7 % (ref 35.0–47.0)
Hemoglobin: 13.3 g/dL (ref 12.0–16.0)
Lymphocytes Relative: 22 %
Lymphs Abs: 1.4 10*3/uL (ref 1.0–3.6)
MCH: 32.6 pg (ref 26.0–34.0)
MCHC: 34.2 g/dL (ref 32.0–36.0)
MCV: 95.2 fL (ref 80.0–100.0)
Monocytes Absolute: 0.4 10*3/uL (ref 0.2–0.9)
Monocytes Relative: 7 %
Neutro Abs: 4.5 10*3/uL (ref 1.4–6.5)
Neutrophils Relative %: 69 %
Platelets: 311 10*3/uL (ref 150–440)
RBC: 4.07 MIL/uL (ref 3.80–5.20)
RDW: 12.5 % (ref 11.5–14.5)
WBC: 6.5 10*3/uL (ref 3.6–11.0)

## 2017-01-28 LAB — BASIC METABOLIC PANEL
Anion gap: 7 (ref 5–15)
BUN: 8 mg/dL (ref 6–20)
CO2: 28 mmol/L (ref 22–32)
Calcium: 9.2 mg/dL (ref 8.9–10.3)
Chloride: 105 mmol/L (ref 101–111)
Creatinine, Ser: 0.85 mg/dL (ref 0.44–1.00)
GFR calc Af Amer: >60 mL/min (ref >60)
GFR calc non Af Amer: >60 mL/min (ref >60)
Glucose, Bld: 89 mg/dL (ref 65–99)
Potassium: 4.1 mmol/L (ref 3.5–5.1)
Sodium: 140 mmol/L (ref 135–145)

## 2017-01-28 MED ORDER — SODIUM CHLORIDE 0.9 % IV SOLN
Freq: Once | INTRAVENOUS | Status: AC
  Administered 2017-01-28: 14:00:00 via INTRAVENOUS

## 2017-01-28 MED ORDER — PROCHLORPERAZINE EDISYLATE 5 MG/ML IJ SOLN
10.0000 mg | Freq: Once | INTRAMUSCULAR | Status: AC
  Administered 2017-01-28: 10 mg via INTRAVENOUS
  Filled 2017-01-28: qty 2

## 2017-01-28 MED ORDER — KETOROLAC TROMETHAMINE 30 MG/ML IJ SOLN: 30.0000 mg | Freq: Once | INTRAMUSCULAR | Status: DC

## 2017-01-28 MED ORDER — KETOROLAC TROMETHAMINE 60 MG/2ML IM SOLN
60.0000 mg | Freq: Once | INTRAMUSCULAR | Status: AC
  Administered 2017-01-28: 60 mg via INTRAMUSCULAR

## 2017-01-28 MED ORDER — KETOROLAC TROMETHAMINE 60 MG/2ML IM SOLN
INTRAMUSCULAR | Status: AC
  Administered 2017-01-28: 60 mg via INTRAMUSCULAR
  Filled 2017-01-28: qty 2

## 2017-01-28 NOTE — ED Notes (Signed)
Pt returned from CT °

## 2017-01-28 NOTE — ED Triage Notes (Signed)
Patient arrives today with c/o elevated BP.  Also c.o headache since Friday.  States takes lisinopril 10 mg daily and Triamt/ HCTZ 37.5/25 daily for HTN.  Has appointment with PCP Tuesday for BP check.  States BP was 203/139 just prior to coming to ED.

## 2017-01-28 NOTE — Discharge Instructions (Signed)
Please return for worse headache or return of severe hypertension. Please make sure you taking her medicines as exposed to. Please follow-up with your doctor on Tuesday as planned.

## 2017-01-28 NOTE — ED Notes (Signed)
FIRST NURSE NOTE: Pt states her BP is up and has had a headache since Friday.

## 2017-01-28 NOTE — ED Provider Notes (Signed)
[facility] Emergency Department Provider Note   ____________________________________________   First MD Initiated Contact with Patient 01/28/17 1046     (approximate)  I have reviewed the triage vital signs and the nursing notes.   HISTORY  Chief Complaint Hypertension and Headache    HPI Vanessa Medina is a 46 y.o. female patient repeats the history states the nurse headache since Friday gradually worsening this morning she woke up and it was quite severe. She's not had one this bad for many years. It's pounding runs from the back of her neck up to the top of her head and down to the eyes. Her blood pressure was elevated this morning initially 607 systolic and somewhat thereafter was 371 systolic patient is taking her medicines as directed she hasn't appointment on Tuesday for blood pressure check. Here in the emergency room her blood pressure has gone down to 062 systolic . Headache continues however really unabated.   Past Medical History:  Diagnosis Date  . Anemia   . Cervical cancer ([place])   . Family history of adverse reaction to anesthesia     " my mother takes a long time time wake up"  . Headache    migraine  . History of shingles   . Hypertension   . Migraine   . Multiple allergies   . Obesity   . Osteoarthritis    left hip  . PONV (postoperative nausea and vomiting)   . Sleep apnea    does not wear CPAP  . Wears glasses     Patient Active Problem List   Diagnosis Date Noted  . History of total hip replacement, left 01/26/2016  . EDEMA 04/28/2008  . LEG PAIN, BILATERAL 12/16/2007  . CERVICAL CANCER 07/02/2007  . MORBID OBESITY 07/02/2007  . DEPRESSION 07/02/2007  . COMMON MIGRAINE 07/02/2007  . ALLERGIC RHINITIS 07/02/2007  . ASTHMA 07/02/2007  . GERD 07/02/2007  . ELEVATED BLOOD PRESSURE WITHOUT DIAGNOSIS OF HYPERTENSION 07/02/2007    Past Surgical History:  Procedure Laterality Date  . ABDOMINAL HYSTERECTOMY    .  APPENDECTOMY    . CHOLECYSTECTOMY    . DIAGNOSTIC LAPAROSCOPY     LOA  . KNEE ARTHROSCOPY    . LAPAROSCOPIC GASTRIC SLEEVE RESECTION WITH HIATAL HERNIA REPAIR    . TONSILLECTOMY    . TOTAL HIP ARTHROPLASTY Left 01/26/2016   Procedure: LEFT TOTAL HIP ARTHROPLASTY ANTERIOR APPROACH;  Surgeon: Leandrew Koyanagi, MD;  Location: [facility];  Service: Orthopedics;  Laterality: Left;    Prior to Admission medications   Medication Sig Start Date End Date Taking? Authorizing Provider  Calcium Carbonate-Vitamin D (CALTRATE 600+D PO) Take 1 tablet by mouth daily.    Yes [provider]  cyanocobalamin (,VITAMIN B-12,) 1000 MCG/ML injection Inject 1,000 mcg into the muscle every 30 (thirty) days.   Yes [provider]  lisinopril (PRINIVIL,ZESTRIL) 10 MG tablet Take 10 mg by mouth daily. 11/15/16  Yes [provider]  Multiple Vitamins-Minerals (MULTIVITAMIN WITH MINERALS) tablet Take 1 tablet by mouth daily.   Yes [provider]  pramipexole (MIRAPEX) 0.125 MG tablet Take 0.5 mg by mouth at bedtime. Pt takes 4 tablets at bedtime   Yes [provider]  triamterene-hydrochlorothiazide (MAXZIDE-25) 37.5-25 MG tablet Take 1 tablet by mouth daily.   Yes [provider]  Vitamin D, Ergocalciferol, (DRISDOL) 50000 units CAPS capsule Take 50,000 Units by mouth every 7 (seven) days. Sundays   Yes [provider]  aspirin EC  325 MG tablet Take 1 tablet (325 mg total) by mouth 2 (two) times daily. Patient not taking: Reported on 01/28/2017 01/26/16   Leandrew Koyanagi, MD  HYDROcodone-acetaminophen [facility]) 10-325 MG tablet Take 1-2 tablets by mouth every 6 (six) hours as needed for moderate pain. Patient not taking: Reported on 01/28/2017 01/26/16   Leandrew Koyanagi, MD  methocarbamol (ROBAXIN) 750 MG tablet TAKE 1 TABLET BY MOUTH TWICE DAILY AS NEEDED FOR MUSCLE SPASMS Patient not taking: Reported on 01/28/2017 07/05/16   Leandrew Koyanagi, MD  ondansetron (ZOFRAN) 4 MG tablet  Take 1-2 tablets (4-8 mg total) by mouth every 8 (eight) hours as needed for nausea or vomiting. Patient not taking: Reported on 01/28/2017 01/26/16   Leandrew Koyanagi, MD  senna-docusate (SENOKOT S) 8.6-50 MG tablet Take 1 tablet by mouth at bedtime as needed. Patient not taking: Reported on 01/28/2017 01/26/16   Leandrew Koyanagi, MD    Allergies Shellfish allergy; Meperidine hcl; Morphine; Bee pollen; Ivp dye [iodinated diagnostic agents]; Codeine; Oxycodone-acetaminophen; Pentazocine lactate; and Propoxyphene n-acetaminophen  Family History  Problem Relation Age of Onset  . Renal Disease Mother   . Hypertension Mother   . Stroke Brother   . Diabetes Other   . Breast cancer Cousin        paternal side    Social History Social History  Substance Use Topics  . Smoking status: Never Smoker  . Smokeless tobacco: Never Used  . Alcohol use Yes     Comment: occasional wine    Review of Systems  Constitutional: No fever/chills Eyes: No visual changes. ENT: No sore throat. Cardiovascular: Denies chest pain. Respiratory: Denies shortness of breath. Gastrointestinal: No abdominal pain.  No nausea, no vomiting.  No diarrhea.  No constipation. Genitourinary: Negative for dysuria. Musculoskeletal: Negative for back pain. Skin: Negative for rash. Neurological: Negative for focal weakness or numbness.   ____________________________________________   PHYSICAL EXAM:  VITAL SIGNS: ED Triage Vitals  Enc Vitals Group     BP 01/28/17 1025 (!) 158/87     Pulse Rate 01/28/17 1025 94     Resp 01/28/17 1025 20     Temp 01/28/17 1025 98.5 F (36.9 C)     Temp Source 01/28/17 1025 Oral     SpO2 01/28/17 1025 98 %     Weight 01/28/17 1026 276 lb (125.2 kg)     Height 01/28/17 1026 5\' 4"  (1.626 m)     Head Circumference --      Peak Flow --      Pain Score 01/28/17 1033 7     Pain Loc --      Pain Edu? --      Excl. in [place]? --     Constitutional: Alert and oriented. Well appearing and in  no acute distress. Eyes: Conjunctivae are normal. PERRL. EOMI.Fundi are slightly difficult to see but look normal Head: Atraumatic. Nose: No congestion/rhinnorhea. Mouth/Throat: Mucous membranes are moist.  Oropharynx non-erythematous. Neck: No stridor.   Cardiovascular: Normal rate, regular rhythm. Grossly normal heart sounds.  Good peripheral circulation. Respiratory: Normal respiratory effort.  No retractions. Lungs CTAB. Gastrointestinal: Soft and nontender. No distention. No abdominal bruits. No CVA tenderness. Musculoskeletal: No lower extremity tenderness nor edema.  No joint effusions. Neurologic:  Normal speech and language. No gross focal neurologic deficits are appreciated. Cranial nerves II through XII are intact, visual fields were not checked rapid alternating movements and finger to nose are normal bilaterally motor strength is 5 out of  5 and sensation is intact throughout No gait instability. Skin:  Skin is warm, dry and intact. No rash noted. Psychiatric: Mood and affect are normal. Speech and behavior are normal.  ____________________________________________   LABS (all labs ordered are listed, but only abnormal results are displayed)  Labs Reviewed  BASIC METABOLIC PANEL  CBC WITH DIFFERENTIAL/PLATELET   ____________________________________________  EKG   ____________________________________________  RADIOLOGY  IMPRESSION: 1. Normal brain.   Electronically Signed   By: Kerby Moors M.D.   On: 01/28/2017 11:52 ____________________________________________   PROCEDURES  Procedure(s) performed:   Procedures  Critical Care performed:  ____________________________________________   INITIAL IMPRESSION / ASSESSMENT AND PLAN / ED COURSE  Pertinent labs & imaging results that were available during my care of the patient were reviewed by me and considered in my medical decision making (see chart for details).  After Com Blood pressure has been  stable.pazine patient feels well. We'll go.      ____________________________________________   FINAL CLINICAL IMPRESSION(S) / ED DIAGNOSES  Final diagnoses:  Bad headache  Essential hypertension      NEW MEDICATIONS STARTED DURING THIS VISIT:  New Prescriptions   No medications on file     Note:  This document was prepared using Dragon voice recognition software and may include unintentional dictation errors.    Nena Polio, MD 01/28/17 [phone]

## 2017-01-30 DIAGNOSIS — I10 Essential (primary) hypertension: Secondary | ICD-10-CM | POA: Diagnosis not present

## 2017-01-30 DIAGNOSIS — G43109 Migraine with aura, not intractable, without status migrainosus: Secondary | ICD-10-CM | POA: Diagnosis not present

## 2017-02-28 DIAGNOSIS — E876 Hypokalemia: Secondary | ICD-10-CM | POA: Diagnosis not present

## 2017-02-28 DIAGNOSIS — I998 Other disorder of circulatory system: Secondary | ICD-10-CM | POA: Diagnosis not present

## 2017-03-15 ENCOUNTER — Telehealth: Payer: Self-pay | Admitting: Cardiovascular Disease

## 2017-03-15 NOTE — Telephone Encounter (Signed)
Called to schedule sooner appt from waitlist.  lmov .

## 2017-03-20 ENCOUNTER — Other Ambulatory Visit: Payer: Self-pay | Admitting: Obstetrics and Gynecology

## 2017-03-20 DIAGNOSIS — Z1231 Encounter for screening mammogram for malignant neoplasm of breast: Secondary | ICD-10-CM

## 2017-03-21 ENCOUNTER — Telehealth: Payer: Self-pay | Admitting: Cardiovascular Disease

## 2017-03-21 NOTE — Telephone Encounter (Signed)
Patient scheduled to come in on 03/28/17 with Dr. Rockey Situ.

## 2017-03-21 NOTE — Telephone Encounter (Signed)
Left voicemail message that we would like to reschedule for a earlier appointment with number to call back.

## 2017-03-27 NOTE — Progress Notes (Signed)
Cardiology Office Note  Date:  03/28/2017   ID:  Vanessa Medina, DOB [birth year], MRN [id number]  PCP:  Maryland Pink, MD   Chief Complaint  Patient presents with  . other    Ref by Dr. Kary Kos decrease BP and dizziness. Meds reviewed by the pt. verbally. Pt. c/o a syncopal spell 1 month ago, has dizziness, nausea, loss of appetite, shortness of breath, irreg. heart beats and chest pain.     HPI:  Vanessa Medina is a 46 y.o. Female Nonsmoker No diabetes With history of hypertension, Morbid obesity History of gastric sleeve 3 years ago Who presents by referral from Dr. Kary Kos for consultation of her labile blood pressure  She reports needing blood pressure medication in the past Previously on triamterene HCTZ Notes reviewed from one year ago when she had a surgery, blood pressure that time 120/55 on triamterene HCTZ. She reports blood pressure has been running higher and she was started on lisinopril 10 mg daily in addition to triamterene HCTZ  Recent episode of hypertension in the setting of headaches Initial blood pressure 150s, up to 200 prior to going to the emergency room Blood pressure 220 systolic in the ER Seem to come down with treatment of her headache  Since then she reports nausea, poor appetite States that she has lost 15 pounds in 2 weeks Blood pressures reviewed from one month ago typically 254-270 systolic Now blood pressures running high 62B - 762 systolic She reports having orthostasis type symptoms  Gets penalized work if she gets up to go to the bathroom often, has to shut down her computer system. Employer calculates how many breaks per day and we'll hold this against her productivity  Unclear why she is not eating well in the past several weeks Wish it never had the gastric sleeve surgery Reported she could not do gastric bypass as she had too many adhesions. Dr. Duke Salvia was the surgeon.   EKG personally reviewed by myself on todays visit Shows  normal sinus rhythm with rate 85 bpm no significant ST or T-wave changes  Orthostatics done in the office today show no significant change in blood pressure averaging 831 systolic, no drop. Climb in heart rate 71 supine, 83 sitting, 108 standing down to 103 after 3 minutes She reports that she was dizzy sitting and standing   PMH:   has a past medical history of Anemia; Cervical cancer ([place]); Family history of adverse reaction to anesthesia; Headache; History of shingles; Hypertension; Migraine; Multiple allergies; Obesity; Osteoarthritis; PONV (postoperative nausea and vomiting); Sleep apnea; and Wears glasses.  PSH:    Past Surgical History:  Procedure Laterality Date  . ABDOMINAL HYSTERECTOMY    . APPENDECTOMY    . CHOLECYSTECTOMY    . DIAGNOSTIC LAPAROSCOPY     LOA  . KNEE ARTHROSCOPY    . LAPAROSCOPIC GASTRIC SLEEVE RESECTION WITH HIATAL HERNIA REPAIR    . TONSILLECTOMY    . TOTAL HIP ARTHROPLASTY Left 01/26/2016   Procedure: LEFT TOTAL HIP ARTHROPLASTY ANTERIOR APPROACH;  Surgeon: Leandrew Koyanagi, MD;  Location: [facility];  Service: Orthopedics;  Laterality: Left;    Current Outpatient Prescriptions  Medication Sig Dispense Refill  . Calcium Carbonate-Vitamin D (CALTRATE 600+D PO) Take 1 tablet by mouth daily.     . cyanocobalamin (,VITAMIN B-12,) 1000 MCG/ML injection Inject 1,000 mcg into the muscle every 30 (thirty) days.    . Multiple Vitamins-Minerals (MULTIVITAMIN WITH MINERALS) tablet Take 1 tablet by mouth daily.    Marland Kitchen  pramipexole (MIRAPEX) 0.125 MG tablet Take 0.5 mg by mouth at bedtime. Pt takes 4 tablets at bedtime    . topiramate (TOPAMAX) 25 MG tablet Take 25 mg by mouth 2 (two) times daily.    . Vitamin D, Ergocalciferol, (DRISDOL) 50000 units CAPS capsule Take 50,000 Units by mouth every 7 (seven) days. Sundays     No current facility-administered medications for this visit.      Allergies:   Shellfish allergy; Meperidine hcl; Morphine; Bee pollen; Ivp dye [iodinated  diagnostic agents]; Codeine; Oxycodone-acetaminophen; Pentazocine lactate; and Propoxyphene n-acetaminophen   Social History:  The patient  reports that she has never smoked. She has never used smokeless tobacco. She reports that she drinks alcohol. She reports that she does not use drugs.   Family History:   family history includes Breast cancer in her cousin; Diabetes in her other; Heart attack (age of onset: 31) in her brother; Heart disease in her mother; Heart failure in her mother; Hypertension in her mother; Renal Disease in her mother; Stroke in her brother; Sudden Cardiac Death in her mother; Valvular heart disease in her mother.    Review of Systems: Review of Systems  Constitutional: Negative.   Respiratory: Negative.   Cardiovascular: Negative.   Gastrointestinal: Positive for abdominal pain and nausea.  Musculoskeletal: Negative.   Neurological: Positive for dizziness.  Psychiatric/Behavioral: Positive for depression.  All other systems reviewed and are negative.    PHYSICAL EXAM: VS:  BP (!) 110/58 (BP Location: Right Arm, Patient Position: Sitting, Cuff Size: Normal)   Pulse 85   Ht 5\' 4"  (1.626 m)   Wt 273 lb (123.8 kg)   BMI 46.86 kg/m  , BMI Body mass index is 46.86 kg/m. GEN: Well nourished, well developed, in no acute distress , obese HEENT: normal  Neck: no JVD, carotid bruits, or masses Cardiac: RRR; no murmurs, rubs, or gallops,no edema  Respiratory:  clear to auscultation bilaterally, normal work of breathing GI: soft, nontender, nondistended, + BS MS: no deformity or atrophy  Skin: warm and dry, no rash Neuro:  Strength and sensation are intact Psych: euthymic mood, full affect    Recent Labs: 01/28/2017: BUN 8; Creatinine, Ser 0.85; Hemoglobin 13.3; Platelets 311; Potassium 4.1; Sodium 140    Lipid Panel No results found for: CHOL, HDL, LDLCALC, TRIG    Wt Readings from Last 3 Encounters:  03/28/17 273 lb (123.8 kg)  01/28/17 276 lb (125.2  kg)  01/26/16 266 lb 5 oz (120.8 kg)       ASSESSMENT AND PLAN:  ELEVATED BLOOD PRESSURE WITHOUT DIAGNOSIS OF HYPERTENSION -  Previously with elevated blood pressure well controlled on medication, now with rapid weight loss over the past several weeks per the patient. Recommended she stop her lisinopril  Orthostatic hypotension - Plan: EKG 12-Lead Reason for her drop in pressure likely from rapid weight loss, not eating. Uncertain if depression is an issue. Recommended she increase her fluid and salt intake Suggested she stop the lisinopril as above She could try compression hose, abdominal binder if tolerated If she continues to have weight loss and is symptomatic, could try midodrine as needed or on a regular basis Other alternative would be to add low-dose fludrocortisone Recommended she try not to lose weight in a rapid manner She will call us with blood pressure measurements over the next several weeks  Family history of heart attack - Plan: EKG 12-Lead Nonsmoker, no diabetes. Low risk of coronary disease Normal EKG  Depression, unspecified depression  type I'm concerned about recent rapid weight loss, no desire to eat Recommended she talk with primary care physician  Gastric sleeve/bypass surgery She reports previous hiatal hernia repair, now with symptoms concerning for recurrent hiatal hernia Recommended if symptoms get worse that she talk with GI   Disposition:   F/U as needed  Patient was seen in consultation for Dr. Kary Kos and will be referred Back to his office for ongoing care of the issues teeth held above    Total encounter time more than 60 minutes  Greater than 50% was spent in counseling and coordination of care with the patient    Orders Placed This Encounter  Procedures  . EKG 12-Lead     Signed, Esmond Plants, M.D., Ph.D. 03/28/2017  [place], [place]

## 2017-03-28 ENCOUNTER — Ambulatory Visit (INDEPENDENT_AMBULATORY_CARE_PROVIDER_SITE_OTHER): Payer: 59 | Admitting: Cardiovascular Disease

## 2017-03-28 ENCOUNTER — Encounter: Payer: Self-pay | Admitting: Cardiovascular Disease

## 2017-03-28 VITALS — BP 110/58 | HR 85 | Ht 64.0 in | Wt 273.0 lb

## 2017-03-28 DIAGNOSIS — I951 Orthostatic hypotension: Secondary | ICD-10-CM | POA: Diagnosis not present

## 2017-03-28 DIAGNOSIS — Z8249 Family history of ischemic heart disease and other diseases of the circulatory system: Secondary | ICD-10-CM | POA: Diagnosis not present

## 2017-03-28 DIAGNOSIS — F32A Depression, unspecified: Secondary | ICD-10-CM

## 2017-03-28 DIAGNOSIS — F329 Major depressive disorder, single episode, unspecified: Secondary | ICD-10-CM

## 2017-03-28 DIAGNOSIS — R030 Elevated blood-pressure reading, without diagnosis of hypertension: Secondary | ICD-10-CM | POA: Diagnosis not present

## 2017-03-28 NOTE — Patient Instructions (Addendum)
Medication Instructions:   Please hold the lisinopril Increase your fluid intake and increase your salt intake  Monitor blood pressure If it runs low <100 , call the office   We could use midodrine or flourinef  Labwork:  No new labs needed  Testing/Procedures:  No further testing at this time   Follow-Up: It was a pleasure seeing you in the office today. Please call us if you have new issues that need to be addressed before your next appt.  [phone]  Your physician wants you to follow-up in: as needed Please call with blood pressures  If you need a refill on your cardiac medications before your next appointment, please call your pharmacy.

## 2017-04-02 DIAGNOSIS — Z0000 Encounter for general adult medical examination without abnormal findings: Secondary | ICD-10-CM | POA: Diagnosis not present

## 2017-04-04 ENCOUNTER — Encounter: Payer: Self-pay | Admitting: Cardiovascular Disease

## 2017-04-05 ENCOUNTER — Telehealth: Payer: Self-pay | Admitting: Cardiovascular Disease

## 2017-04-05 NOTE — Telephone Encounter (Signed)
Patient sent bp log through mychart.  She wants Dr. Rockey Situ to know she is not feeling very well

## 2017-04-05 NOTE — Telephone Encounter (Signed)
Contacted Nika at [employer] re: reed group assessment form for patient.     Per Deno Etienne this form is not sent to [employer] its is an assessment that must be completed by MD and we do not charge a fee.    ROI faxed to patient for completion. Upon return will place in nurse box.

## 2017-04-05 NOTE — Telephone Encounter (Signed)
If she is having symptoms of orthostasis, (dizziness when standing from sitting position) She is welcome to try midodrine  (10 mg pill, up to TID prn, try 1/2 pill first, increase to full pill if needed for low pressures  If she does not feel well sitting or laying down,  That should not be from blood pressure, that would mnean something else going on and would check with PMD

## 2017-04-05 NOTE — Telephone Encounter (Addendum)
Pt reports that she sent you her BP log and would like you to review and make recommendations to her thru MyChart.  7/31:  10:31 pm 99/62, 65 7:56 pm 98/54, 75 6:52 pm 99/50, 68 4:27 pm 107/61, 65 8/1: 6:46 pm 91/47, 60 12:23 pm 105/61, 71 9:06 am 109/59, 77 5:42 am 93/48, 70

## 2017-04-06 ENCOUNTER — Ambulatory Visit
Admission: RE | Admit: 2017-04-06 | Discharge: 2017-04-06 | Disposition: A | Discharge status: Home / Self Care | Payer: 59 | Source: Ambulatory Visit | Attending: Obstetrics and Gynecology | Admitting: Obstetrics and Gynecology

## 2017-04-06 ENCOUNTER — Other Ambulatory Visit: Payer: Self-pay

## 2017-04-06 DIAGNOSIS — Z1231 Encounter for screening mammogram for malignant neoplasm of breast: Secondary | ICD-10-CM | POA: Insufficient documentation

## 2017-04-06 MED ORDER — MIDODRINE HCL 10 MG PO TABS: 10.0000 mg | ORAL_TABLET | Freq: Three times a day (TID) | ORAL | 6 refills | Status: DC

## 2017-04-06 NOTE — Telephone Encounter (Signed)
Sent Dr. Donivan Scull response via [place].

## 2017-04-06 NOTE — Progress Notes (Signed)
mido

## 2017-04-09 ENCOUNTER — Other Ambulatory Visit: Payer: Self-pay | Admitting: Gastroenterology

## 2017-04-09 DIAGNOSIS — Z8719 Personal history of other diseases of the digestive system: Secondary | ICD-10-CM | POA: Diagnosis not present

## 2017-04-09 DIAGNOSIS — R1013 Epigastric pain: Secondary | ICD-10-CM

## 2017-04-09 DIAGNOSIS — R0789 Other chest pain: Secondary | ICD-10-CM

## 2017-04-16 ENCOUNTER — Ambulatory Visit
Admission: RE | Admit: 2017-04-16 | Discharge: 2017-04-16 | Disposition: A | Discharge status: Home / Self Care | Payer: 59 | Source: Ambulatory Visit | Attending: Gastroenterology | Admitting: Gastroenterology

## 2017-04-16 DIAGNOSIS — Z8719 Personal history of other diseases of the digestive system: Secondary | ICD-10-CM

## 2017-04-16 DIAGNOSIS — R1013 Epigastric pain: Secondary | ICD-10-CM | POA: Diagnosis present

## 2017-04-16 DIAGNOSIS — R0789 Other chest pain: Secondary | ICD-10-CM | POA: Diagnosis not present

## 2017-05-02 DIAGNOSIS — I959 Hypotension, unspecified: Secondary | ICD-10-CM | POA: Diagnosis not present

## 2017-05-08 DIAGNOSIS — R1013 Epigastric pain: Secondary | ICD-10-CM | POA: Diagnosis not present

## 2017-05-08 DIAGNOSIS — K30 Functional dyspepsia: Secondary | ICD-10-CM | POA: Diagnosis not present

## 2017-05-08 DIAGNOSIS — R6881 Early satiety: Secondary | ICD-10-CM | POA: Diagnosis not present

## 2017-05-15 DIAGNOSIS — Z1211 Encounter for screening for malignant neoplasm of colon: Secondary | ICD-10-CM | POA: Diagnosis not present

## 2017-05-15 DIAGNOSIS — Z1231 Encounter for screening mammogram for malignant neoplasm of breast: Secondary | ICD-10-CM | POA: Diagnosis not present

## 2017-05-15 DIAGNOSIS — Z01419 Encounter for gynecological examination (general) (routine) without abnormal findings: Secondary | ICD-10-CM | POA: Diagnosis not present

## 2017-05-24 ENCOUNTER — Ambulatory Visit: Payer: 59 | Admitting: Cardiovascular Disease

## 2017-05-24 ENCOUNTER — Telehealth: Payer: Self-pay | Admitting: Cardiovascular Disease

## 2017-05-24 NOTE — Telephone Encounter (Signed)
Received records request from [employer], forwarded to [facility] for processing.

## 2017-06-06 ENCOUNTER — Telehealth: Payer: Self-pay | Admitting: Cardiovascular Disease

## 2017-06-06 NOTE — Telephone Encounter (Signed)
Acceptable risk for surgery No testing needed

## 2017-06-06 NOTE — Telephone Encounter (Signed)
Received request for cardiac clearance for colonoscopy and/or upper endoscopy for July 25, 2017 with [facility] GI department. Previous visit was on 03/28/17 and no testing ordered. Called and spoke with patient and she reports no changes since last visit. Route clearance to [phone] attention Robyn.

## 2017-06-07 NOTE — Telephone Encounter (Signed)
Clearance routed to number provided.  

## 2017-06-29 DIAGNOSIS — Z23 Encounter for immunization: Secondary | ICD-10-CM | POA: Diagnosis not present

## 2017-07-24 ENCOUNTER — Encounter: Payer: Self-pay | Admitting: *Deleted

## 2017-07-25 ENCOUNTER — Ambulatory Visit: Payer: 59 | Admitting: Anesthesiology

## 2017-07-25 ENCOUNTER — Encounter
Admission: RE | Disposition: A | Discharge status: Home / Self Care | Payer: Self-pay | Source: Ambulatory Visit | Attending: Unknown Physician Specialty

## 2017-07-25 ENCOUNTER — Other Ambulatory Visit: Payer: Self-pay

## 2017-07-25 ENCOUNTER — Ambulatory Visit
Admission: RE | Admit: 2017-07-25 | Discharge: 2017-07-25 | Disposition: A | Discharge status: Home / Self Care | Payer: 59 | Source: Ambulatory Visit | Attending: Unknown Physician Specialty | Admitting: Unknown Physician Specialty

## 2017-07-25 ENCOUNTER — Encounter: Payer: Self-pay | Admitting: Anesthesiology

## 2017-07-25 DIAGNOSIS — Z91041 Radiographic dye allergy status: Secondary | ICD-10-CM | POA: Diagnosis not present

## 2017-07-25 DIAGNOSIS — E669 Obesity, unspecified: Secondary | ICD-10-CM | POA: Diagnosis not present

## 2017-07-25 DIAGNOSIS — Z9884 Bariatric surgery status: Secondary | ICD-10-CM | POA: Diagnosis not present

## 2017-07-25 DIAGNOSIS — Z91030 Bee allergy status: Secondary | ICD-10-CM | POA: Diagnosis not present

## 2017-07-25 DIAGNOSIS — Z8619 Personal history of other infectious and parasitic diseases: Secondary | ICD-10-CM | POA: Insufficient documentation

## 2017-07-25 DIAGNOSIS — Z79899 Other long term (current) drug therapy: Secondary | ICD-10-CM | POA: Insufficient documentation

## 2017-07-25 DIAGNOSIS — Z8541 Personal history of malignant neoplasm of cervix uteri: Secondary | ICD-10-CM | POA: Insufficient documentation

## 2017-07-25 DIAGNOSIS — Z833 Family history of diabetes mellitus: Secondary | ICD-10-CM | POA: Diagnosis not present

## 2017-07-25 DIAGNOSIS — Z91013 Allergy to seafood: Secondary | ICD-10-CM | POA: Insufficient documentation

## 2017-07-25 DIAGNOSIS — Z823 Family history of stroke: Secondary | ICD-10-CM | POA: Insufficient documentation

## 2017-07-25 DIAGNOSIS — R1310 Dysphagia, unspecified: Secondary | ICD-10-CM | POA: Diagnosis not present

## 2017-07-25 DIAGNOSIS — Z96642 Presence of left artificial hip joint: Secondary | ICD-10-CM | POA: Insufficient documentation

## 2017-07-25 DIAGNOSIS — Z90710 Acquired absence of both cervix and uterus: Secondary | ICD-10-CM | POA: Insufficient documentation

## 2017-07-25 DIAGNOSIS — K210 Gastro-esophageal reflux disease with esophagitis: Secondary | ICD-10-CM | POA: Insufficient documentation

## 2017-07-25 DIAGNOSIS — Z8249 Family history of ischemic heart disease and other diseases of the circulatory system: Secondary | ICD-10-CM | POA: Insufficient documentation

## 2017-07-25 DIAGNOSIS — G4730 Sleep apnea, unspecified: Secondary | ICD-10-CM | POA: Diagnosis not present

## 2017-07-25 DIAGNOSIS — Z9049 Acquired absence of other specified parts of digestive tract: Secondary | ICD-10-CM | POA: Diagnosis not present

## 2017-07-25 DIAGNOSIS — Z885 Allergy status to narcotic agent status: Secondary | ICD-10-CM | POA: Diagnosis not present

## 2017-07-25 DIAGNOSIS — F329 Major depressive disorder, single episode, unspecified: Secondary | ICD-10-CM | POA: Diagnosis not present

## 2017-07-25 DIAGNOSIS — I10 Essential (primary) hypertension: Secondary | ICD-10-CM | POA: Diagnosis not present

## 2017-07-25 DIAGNOSIS — R1013 Epigastric pain: Secondary | ICD-10-CM | POA: Diagnosis present

## 2017-07-25 DIAGNOSIS — Z87892 Personal history of anaphylaxis: Secondary | ICD-10-CM | POA: Diagnosis not present

## 2017-07-25 DIAGNOSIS — Z841 Family history of disorders of kidney and ureter: Secondary | ICD-10-CM | POA: Diagnosis not present

## 2017-07-25 DIAGNOSIS — Z6841 Body Mass Index (BMI) 40.0 and over, adult: Secondary | ICD-10-CM | POA: Diagnosis not present

## 2017-07-25 DIAGNOSIS — G43909 Migraine, unspecified, not intractable, without status migrainosus: Secondary | ICD-10-CM | POA: Insufficient documentation

## 2017-07-25 DIAGNOSIS — K222 Esophageal obstruction: Secondary | ICD-10-CM | POA: Diagnosis not present

## 2017-07-25 HISTORY — PX: ESOPHAGOGASTRODUODENOSCOPY (EGD) WITH PROPOFOL: SHX5813

## 2017-07-25 SURGERY — ESOPHAGOGASTRODUODENOSCOPY (EGD) WITH PROPOFOL
Anesthesia: General

## 2017-07-25 MED ORDER — ONDANSETRON HCL 4 MG/2ML IJ SOLN
INTRAMUSCULAR | Status: AC
  Filled 2017-07-25: qty 2

## 2017-07-25 MED ORDER — EPHEDRINE SULFATE 50 MG/ML IJ SOLN
INTRAMUSCULAR | Status: DC | PRN
  Administered 2017-07-25: 10 mg via INTRAVENOUS

## 2017-07-25 MED ORDER — PIPERACILLIN-TAZOBACTAM 3.375 G IVPB
INTRAVENOUS | Status: AC
  Administered 2017-07-25: 3.375 g via INTRAVENOUS
  Filled 2017-07-25: qty 50

## 2017-07-25 MED ORDER — PROPOFOL 500 MG/50ML IV EMUL
INTRAVENOUS | Status: DC | PRN
  Administered 2017-07-25: 120 ug/kg/min via INTRAVENOUS

## 2017-07-25 MED ORDER — MIDAZOLAM HCL 2 MG/2ML IJ SOLN
INTRAMUSCULAR | Status: DC | PRN
  Administered 2017-07-25: 2 mg via INTRAVENOUS

## 2017-07-25 MED ORDER — PIPERACILLIN-TAZOBACTAM 3.375 G IVPB
3.3750 g | Freq: Once | INTRAVENOUS | Status: AC
  Administered 2017-07-25: 3.375 g via INTRAVENOUS

## 2017-07-25 MED ORDER — ONDANSETRON HCL 4 MG/2ML IJ SOLN
INTRAMUSCULAR | Status: DC | PRN
  Administered 2017-07-25: 4 mg via INTRAVENOUS

## 2017-07-25 MED ORDER — PROPOFOL 500 MG/50ML IV EMUL
INTRAVENOUS | Status: AC
Start: 2017-07-25 — End: ?
  Filled 2017-07-25: qty 50

## 2017-07-25 MED ORDER — MIDAZOLAM HCL 2 MG/2ML IJ SOLN
INTRAMUSCULAR | Status: AC
  Filled 2017-07-25: qty 2

## 2017-07-25 MED ORDER — EPHEDRINE SULFATE 50 MG/ML IJ SOLN
INTRAMUSCULAR | Status: AC
  Filled 2017-07-25: qty 1

## 2017-07-25 MED ORDER — FENTANYL CITRATE (PF) 100 MCG/2ML IJ SOLN
INTRAMUSCULAR | Status: AC
  Filled 2017-07-25: qty 2

## 2017-07-25 MED ORDER — SODIUM CHLORIDE 0.9 % IV SOLN
INTRAVENOUS | Status: DC
  Administered 2017-07-25: 1000 mL via INTRAVENOUS

## 2017-07-25 MED ORDER — SODIUM CHLORIDE 0.9 % IV SOLN: INTRAVENOUS | Status: DC

## 2017-07-25 MED ORDER — LIDOCAINE HCL (PF) 2 % IJ SOLN
INTRAMUSCULAR | Status: AC
  Filled 2017-07-25: qty 10

## 2017-07-25 MED ORDER — LIDOCAINE HCL (CARDIAC) 20 MG/ML IV SOLN
INTRAVENOUS | Status: DC | PRN
  Administered 2017-07-25: 50 mg via INTRAVENOUS

## 2017-07-25 MED ORDER — FENTANYL CITRATE (PF) 100 MCG/2ML IJ SOLN
INTRAMUSCULAR | Status: DC | PRN
  Administered 2017-07-25: 50 ug via INTRAVENOUS

## 2017-07-25 NOTE — Transfer of Care (Signed)
Immediate Anesthesia Transfer of Care Note  Patient: Vanessa Medina  Procedure(s) Performed: ESOPHAGOGASTRODUODENOSCOPY (EGD) WITH PROPOFOL (N/A )  Patient Location: PACU  Anesthesia Type:General  Level of Consciousness: awake and sedated  Airway & Oxygen Therapy: Patient Spontanous Breathing and Patient connected to nasal cannula oxygen  Post-op Assessment: Report given to RN and Post -op Vital signs reviewed and stable  Post vital signs: Reviewed and stable  Last Vitals:  Vitals:   07/25/17 1246  BP: 132/74  Pulse: (!) 54  Resp: 18  Temp: (!) 36.3 C  SpO2: 100%    Last Pain:  Vitals:   07/25/17 1246  TempSrc: Tympanic  PainSc: 4          Complications: No apparent anesthesia complications

## 2017-07-25 NOTE — Anesthesia Preprocedure Evaluation (Signed)
Anesthesia Evaluation  Patient identified by MRN, date of birth, ID band Patient awake    Reviewed: Allergy & Precautions, NPO status , Patient's Chart, lab work & pertinent test results, reviewed documented beta blocker date and time   History of Anesthesia Complications (+) PONV, Family history of anesthesia reaction and history of anesthetic complications  Airway Mallampati: III  TM Distance: >3 FB     Dental  (+) Chipped   Pulmonary sleep apnea ,           Cardiovascular hypertension, Pt. on medications      Neuro/Psych  Headaches, PSYCHIATRIC DISORDERS Depression    GI/Hepatic GERD  Medicated,  Endo/Other    Renal/GU      Musculoskeletal  (+) Arthritis ,   Abdominal   Peds  Hematology  (+) anemia ,   Anesthesia Other Findings Obesity. Gastric sleeve.  Reproductive/Obstetrics                             Anesthesia Physical Anesthesia Plan  ASA: III  Anesthesia Plan: General   Post-op Pain Management:    Induction: Intravenous  PONV Risk Score and Plan:   Airway Management Planned:   Additional Equipment:   Intra-op Plan:   Post-operative Plan:   Informed Consent: I have reviewed the patients History and Physical, chart, labs and discussed the procedure including the risks, benefits and alternatives for the proposed anesthesia with the patient or authorized representative who has indicated his/her understanding and acceptance.     Plan Discussed with: CRNA  Anesthesia Plan Comments:         Anesthesia Quick Evaluation

## 2017-07-25 NOTE — Anesthesia Postprocedure Evaluation (Signed)
Anesthesia Post Note  Patient: Vanessa Medina  Procedure(s) Performed: ESOPHAGOGASTRODUODENOSCOPY (EGD) WITH PROPOFOL (N/A )  Patient location during evaluation: Endoscopy Anesthesia Type: General Level of consciousness: awake and alert Pain management: pain level controlled Vital Signs Assessment: post-procedure vital signs reviewed and stable Respiratory status: spontaneous breathing, nonlabored ventilation, respiratory function stable and patient connected to nasal cannula oxygen Cardiovascular status: blood pressure returned to baseline and stable Postop Assessment: no apparent nausea or vomiting Anesthetic complications: no     Last Vitals:  Vitals:   07/25/17 1246 07/25/17 1358  BP: 132/74 (!) 108/43  Pulse: (!) 54 70  Resp: 18 (!) 21  Temp: (!) 36.3 C 36.4 C  SpO2: 100% 99%    Last Pain:  Vitals:   07/25/17 1358  TempSrc: Tympanic  PainSc:                  Joannah Gitlin S

## 2017-07-25 NOTE — Op Note (Signed)
[facility] Gastroenterology Patient Name: Vanessa Medina Procedure Date: 07/25/2017 1:35 PM MRN: [id number] Account #: [id number] Date of Birth: 1970/11/06 Admit Type: Outpatient Age: 46 Room: [facility] ENDO ROOM 3 Gender: Female Note Status: Finalized Procedure:            Upper GI endoscopy Indications:          Epigastric abdominal pain, Dysphagia Providers:            Manya Silvas, MD Referring MD:         Irven Easterly. Kary Kos, MD (Referring MD) Medicines:            Propofol per Anesthesia Complications:        No immediate complications. Procedure:            Pre-Anesthesia Assessment:                       - After reviewing the risks and benefits, the patient                        was deemed in satisfactory condition to undergo the                        procedure.                       After obtaining informed consent, the endoscope was                        passed under direct vision. Throughout the procedure,                        the patient's blood pressure, pulse, and oxygen                        saturations were monitored continuously. The Endoscope                        was introduced through the mouth, and advanced to the                        prepyloric region, stomach. The upper GI endoscopy was                        accomplished without difficulty. The patient tolerated                        the procedure well. Findings:      LA Grade A (one or more mucosal breaks less than 5 mm, not extending       between tops of 2 mucosal folds) esophagitis with no bleeding was found       40 cm from the incisors.      Prominent gastric folds were found at the gastroesophageal junction.      The stomach was normal.      The examined duodenum was normal.      I dilated the lower esophagus and GEJ with a 15-18 balloon 3 times at       level 18. Impression:           - LA Grade A reflux esophagitis.                       -  Enlarged gastric folds.                    - Normal stomach.                       - Normal examined duodenum.                       - No specimens collected. Recommendation:       Carafate slurry, PPI twice a day, soft food. PPI like                        Nexium or similar drug.                       - The findings and recommendations were discussed with                        the patient's family. Manya Silvas, MD 07/25/2017 2:01:38 PM This report has been signed electronically. Number of Addenda: 0 Note Initiated On: 07/25/2017 1:35 PM      [facility]

## 2017-07-25 NOTE — Anesthesia Post-op Follow-up Note (Signed)
Anesthesia QCDR form completed.        

## 2017-07-25 NOTE — H&P (Signed)
Primary Care Physician:  [place] Pink, MD Primary Gastroenterologist:  Dr. Vira Agar  Pre-Procedure History & Physical: HPI:  Vanessa Medina is a 46 y.o. female is here for an endoscopy.   Past Medical History:  Diagnosis Date  . Anemia   . Cervical cancer ([place])   . Family history of adverse reaction to anesthesia     " my mother takes a long time time wake up"  . Headache    migraine  . History of shingles   . Hypertension   . Migraine   . Multiple allergies   . Obesity   . Osteoarthritis    left hip  . PONV (postoperative nausea and vomiting)   . Sleep apnea    does not wear CPAP  . Wears glasses     Past Surgical History:  Procedure Laterality Date  . ABDOMINAL HYSTERECTOMY    . APPENDECTOMY    . CHOLECYSTECTOMY    . DIAGNOSTIC LAPAROSCOPY     LOA  . KNEE ARTHROSCOPY    . LAPAROSCOPIC GASTRIC SLEEVE RESECTION WITH HIATAL HERNIA REPAIR    . TONSILLECTOMY    . TOTAL HIP ARTHROPLASTY Left 01/26/2016   Procedure: LEFT TOTAL HIP ARTHROPLASTY ANTERIOR APPROACH;  Surgeon: Leandrew Koyanagi, MD;  Location: [facility];  Service: Orthopedics;  Laterality: Left;    Prior to Admission medications   Medication Sig Start Date End Date Taking? Authorizing Provider  Calcium Carbonate-Vitamin D (CALTRATE 600+D PO) Take 1 tablet by mouth daily.    Yes [provider]  cyanocobalamin (,VITAMIN B-12,) 1000 MCG/ML injection Inject 1,000 mcg into the muscle every 30 (thirty) days.   Yes [provider]  Multiple Vitamins-Minerals (MULTIVITAMIN WITH MINERALS) tablet Take 1 tablet by mouth daily.   Yes [provider]  pramipexole (MIRAPEX) 0.125 MG tablet Take 0.5 mg by mouth at bedtime. Pt takes 4 tablets at bedtime   Yes [provider]  topiramate (TOPAMAX) 25 MG tablet Take 25 mg by mouth 2 (two) times daily.   Yes [provider]  Vitamin D, Ergocalciferol, (DRISDOL) 50000 units CAPS capsule Take 50,000 Units by mouth every 7 (seven) days.  Sundays   Yes [provider]  midodrine (PROAMATINE) 10 MG tablet Take 1 tablet (10 mg total) by mouth 3 (three) times daily. 04/06/17   Minna Merritts, MD    Allergies as of 06/25/2017 - Review Complete 03/28/2017  Allergen Reaction Noted  . Shellfish allergy Anaphylaxis 01/25/2016  . Meperidine hcl Other (See Comments)   . Morphine Other (See Comments)   . Bee pollen Other (See Comments) 01/25/2016  . Ivp dye [iodinated diagnostic agents] Other (See Comments) 01/19/2016  . Codeine Nausea And Vomiting   . Oxycodone-acetaminophen Itching   . Pentazocine lactate Nausea And Vomiting   . Propoxyphene n-acetaminophen Nausea And Vomiting     Family History  Problem Relation Age of Onset  . Renal Disease Mother   . Hypertension Mother   . Sudden Cardiac Death Mother   . Heart failure Mother   . Valvular heart disease Mother   . Heart disease Mother   . Stroke Brother   . Heart attack Brother 21  . Diabetes Other   . Breast cancer Cousin        paternal side    Social History   Socioeconomic History  . Marital status: Divorced    Spouse name: Not on file  . Number of children: Not on file  . Years of education: Not  on file  . Highest education level: Not on file  Social Needs  . Financial resource strain: Not on file  . Food insecurity - worry: Not on file  . Food insecurity - inability: Not on file  . Transportation needs - medical: Not on file  . Transportation needs - non-medical: Not on file  Occupational History  . Not on file  Tobacco Use  . Smoking status: Never Smoker  . Smokeless tobacco: Never Used  Substance and Sexual Activity  . Alcohol use: Yes    Comment: occasional wine  . Drug use: No  . Sexual activity: Not on file  Other Topics Concern  . Not on file  Social History Narrative  . Not on file    Review of Systems: See HPI, otherwise negative ROS  Physical Exam: BP 132/74   Pulse (!) 54   Temp (!) 97.4 F (36.3 C) (Tympanic)    Resp 18   Ht 5\' 4"  (1.626 m)   Wt 117.9 kg (260 lb)   SpO2 100%   BMI 44.63 kg/m  General:   Alert,  pleasant and cooperative in NAD Head:  Normocephalic and atraumatic. Neck:  Supple; no masses or thyromegaly. Lungs:  Clear throughout to auscultation.    Heart:  Regular rate and rhythm. Abdomen:  Soft, nontender and nondistended. Normal bowel sounds, without guarding, and without rebound.   Neurologic:  Alert and  oriented x4;  grossly normal neurologically.  Impression/Plan: Vanessa Medina is here for an endoscopy to be performed for dysphagia.  Risks, benefits, limitations, and alternatives regarding  endoscopy have been reviewed with the patient.  Questions have been answered.  All parties agreeable.   Gaylyn Cheers, MD  07/25/2017, 1:31 PM

## 2017-07-25 NOTE — Anesthesia Procedure Notes (Signed)
Performed by: Cook-Martin, Zamarian Scarano Pre-anesthesia Checklist: Patient identified, Emergency Drugs available, Suction available, Patient being monitored and Timeout performed Patient Re-evaluated:Patient Re-evaluated prior to induction Oxygen Delivery Method: Nasal cannula Preoxygenation: Pre-oxygenation with 100% oxygen Induction Type: IV induction Airway Equipment and Method: Bite block Placement Confirmation: CO2 detector and positive ETCO2       

## 2017-07-30 ENCOUNTER — Encounter: Payer: Self-pay | Admitting: Unknown Physician Specialty

## 2017-09-03 ENCOUNTER — Ambulatory Visit (INDEPENDENT_AMBULATORY_CARE_PROVIDER_SITE_OTHER): Payer: 59

## 2017-09-03 ENCOUNTER — Telehealth (INDEPENDENT_AMBULATORY_CARE_PROVIDER_SITE_OTHER): Payer: Self-pay | Admitting: Orthopaedic Surgery

## 2017-09-03 ENCOUNTER — Encounter (INDEPENDENT_AMBULATORY_CARE_PROVIDER_SITE_OTHER): Payer: Self-pay | Admitting: Orthopaedic Surgery

## 2017-09-03 ENCOUNTER — Ambulatory Visit (INDEPENDENT_AMBULATORY_CARE_PROVIDER_SITE_OTHER): Payer: 59 | Admitting: Orthopaedic Surgery

## 2017-09-03 DIAGNOSIS — M545 Low back pain, unspecified: Secondary | ICD-10-CM | POA: Insufficient documentation

## 2017-09-03 DIAGNOSIS — G8929 Other chronic pain: Secondary | ICD-10-CM

## 2017-09-03 DIAGNOSIS — Z96642 Presence of left artificial hip joint: Secondary | ICD-10-CM | POA: Diagnosis not present

## 2017-09-03 MED ORDER — MELOXICAM 7.5 MG PO TABS: 7.5000 mg | ORAL_TABLET | Freq: Every day | ORAL | 2 refills | Status: DC | PRN

## 2017-09-03 MED ORDER — CLINDAMYCIN HCL 150 MG PO CAPS: ORAL_CAPSULE | ORAL | 1 refills | Status: DC

## 2017-09-03 MED ORDER — TRAMADOL HCL 50 MG PO TABS: 50.0000 mg | ORAL_TABLET | Freq: Four times a day (QID) | ORAL | 0 refills | Status: DC | PRN

## 2017-09-03 NOTE — Progress Notes (Signed)
Office Visit Note   Patient: Vanessa Medina           Date of Birth: 1971/03/10          Visit Date: 09/03/2017 MRN: [id number]               Requested by: No referring provider defined for this encounter. PCP: Vanessa Pink, MD   Assessment & Plan: Visit Diagnoses:  1. History of total hip replacement, left   2. Chronic low back pain, unspecified back pain laterality, with sciatica presence unspecified     Plan: In regards to Vanessa Medina's left hip I feel as though she is doing well.  The main issue here today is her back.  We are going to start her on Mobic daily as needed as well as tramadol as needed.  Prescriptions for these were called in and given.  We are going to renew her plain Handicap placard as well.  We also going order an MRI of her lumbar spine.  Once we have these results we will will refer her accordingly.  Follow-Up Instructions: Return if symptoms worsen or fail to improve.   Orders:  Orders Placed This Encounter  Procedures  . XR HIP UNILAT W OR W/O PELVIS 2-3 VIEWS LEFT  . XR Lumbar Spine 2-3 Views  . XR Thoracic Spine 2 View   Meds ordered this encounter  Medications  . clindamycin (CLEOCIN) 150 MG capsule    Sig: Take 4 tabs one hour before dental procedure    Dispense:  12 capsule    Refill:  1    Order Specific Question:   Supervising Provider    Answer:   Vanessa Medina [phone]  . meloxicam (MOBIC) 7.5 MG tablet    Sig: Take 1 tablet (7.5 mg total) by mouth daily as needed for pain.    Dispense:  30 tablet    Refill:  2    Order Specific Question:   Supervising Provider    Answer:   Vanessa Medina [phone]  . traMADol (ULTRAM) 50 MG tablet    Sig: Take 1 tablet (50 mg total) by mouth every 6 (six) hours as needed.    Dispense:  60 tablet    Refill:  0    Order Specific Question:   Supervising Provider    Answer:   Vanessa Medina [[id number]]      Procedures: No procedures performed   Clinical Data: No additional  findings.   Subjective: Chief Complaint  Patient presents with  . Left Hip - Follow-up    HPI this is a pleasant 46 year old female presents to our clinic today for follow-up of her left total hip replacement.  Date of surgery 01/22/2016.  Doing well in regards to her left hip.  The main issue she comes in today however is her lumbar pain with associated left lower extremity radiculopathy.  She is 7 years out from a L1 to fracture from falling down a set of stairs 7 years ago.  She was initially seen at prenatal clinic for this.  Given a brace.  She has not returned since coronary clinic moved to [facility].  His increased pain with lumbar flexion and extension.  She is unable to get comfortable as her pain is progressively worsened.  She is also starting at numbness tingling burning on her left lower extremity as well as associated weakness.  She is fallen 3 times in the past 3 weeks secondary to the pain and weakness.  Review of several injections or surgical intervention to her lumbar spine.  Review of Systems determine if neurology are negative.   Objective: Vital Signs: There were no vitals taken for this visit.  Physical Exam rule out well-nourished female no acute distress.  Alert and oriented x3. Ortho Exam limitation of her left hip reveals logroll.  She is able to lecture hip but has associated weakness with resistant to logroll.  She does have some paraspinal tenderness to palpation L1 through L5.  Increased pain with lumbar flexion and extension.  Specialty Comments:  No specialty comments available.  Imaging: Xr Hip Unilat W Or W/o Pelvis 2-3 Views Left  Result Date: 09/03/2017 Well-seated prosthesis without evidence of subsidence or osteolysis.  Xr Thoracic Spine 2 View  Result Date: 09/03/2017 No acute findings.  Xr Lumbar Spine 2-3 Views  Result Date: 09/03/2017 Marked degenerative changes L1-2 and L4-5 with significant anterior spurring.    PMFS  History: Patient Active Problem List   Diagnosis Date Noted  . Chronic low back pain 09/03/2017  . History of total hip replacement, left 01/26/2016  . EDEMA 04/28/2008  . LEG PAIN, BILATERAL 12/16/2007  . CERVICAL CANCER 07/02/2007  . MORBID OBESITY 07/02/2007  . DEPRESSION 07/02/2007  . COMMON MIGRAINE 07/02/2007  . ALLERGIC RHINITIS 07/02/2007  . ASTHMA 07/02/2007  . GERD 07/02/2007  . ELEVATED BLOOD PRESSURE WITHOUT DIAGNOSIS OF HYPERTENSION 07/02/2007   Past Medical History:  Diagnosis Date  . Anemia   . Cervical cancer ([place])   . Family history of adverse reaction to anesthesia     " my mother takes a long time time wake up"  . Headache    migraine  . History of shingles   . Hypertension   . Migraine   . Multiple allergies   . Obesity   . Osteoarthritis    left hip  . PONV (postoperative nausea and vomiting)   . Sleep apnea    does not wear CPAP  . Wears glasses     Family History  Problem Relation Age of Onset  . Renal Disease Mother   . Hypertension Mother   . Sudden Cardiac Death Mother   . Heart failure Mother   . Valvular heart disease Mother   . Heart disease Mother   . Stroke Brother   . Heart attack Brother 43  . Diabetes Other   . Breast cancer Cousin        paternal side    Past Surgical History:  Procedure Laterality Date  . ABDOMINAL HYSTERECTOMY    . APPENDECTOMY    . CHOLECYSTECTOMY    . DIAGNOSTIC LAPAROSCOPY     LOA  . ESOPHAGOGASTRODUODENOSCOPY (EGD) WITH PROPOFOL N/A 07/25/2017   Procedure: ESOPHAGOGASTRODUODENOSCOPY (EGD) WITH PROPOFOL;  Surgeon: Vanessa Silvas, MD;  Location: [facility] ENDOSCOPY;  Service: Endoscopy;  Laterality: N/A;  . KNEE ARTHROSCOPY    . LAPAROSCOPIC GASTRIC SLEEVE RESECTION WITH HIATAL HERNIA REPAIR    . TONSILLECTOMY    . TOTAL HIP ARTHROPLASTY Left 01/26/2016   Procedure: LEFT TOTAL HIP ARTHROPLASTY ANTERIOR APPROACH;  Surgeon: Vanessa Koyanagi, MD;  Location: [place];  Service: Orthopedics;  Laterality: Left;    Social History   Occupational History  . Not on file  Tobacco Use  . Smoking status: Never Smoker  . Smokeless tobacco: Never Used  Substance and Sexual Activity  . Alcohol use: Yes    Comment: occasional wine  . Drug use: No  . Sexual activity: Not on file

## 2017-09-03 NOTE — Telephone Encounter (Signed)
[facility] left a message stating that the patient could possibly be allergic to the Tramadol.  Please give them a call at [phone].  Thank you.

## 2017-09-05 ENCOUNTER — Other Ambulatory Visit (INDEPENDENT_AMBULATORY_CARE_PROVIDER_SITE_OTHER): Payer: Self-pay

## 2017-09-05 ENCOUNTER — Telehealth (INDEPENDENT_AMBULATORY_CARE_PROVIDER_SITE_OTHER): Payer: Self-pay

## 2017-09-05 MED ORDER — ACETAMINOPHEN-CODEINE #3 300-30 MG PO TABS: 1.0000 | ORAL_TABLET | Freq: Two times a day (BID) | ORAL | 0 refills | Status: DC | PRN

## 2017-09-05 NOTE — Telephone Encounter (Signed)
Is there anything you would like to give her instead?

## 2017-09-05 NOTE — Telephone Encounter (Signed)
Rx pending signature. Will be ready for pick up tomorrow AM.

## 2017-09-05 NOTE — Telephone Encounter (Signed)
Adonis Huguenin pharmacist with [facility] called concerning a Rx for Tramadol for patient.  Cb# is [phone].  Please advise.  Thank you.

## 2017-09-05 NOTE — Telephone Encounter (Signed)
Tylenol 3

## 2017-09-05 NOTE — Telephone Encounter (Signed)
Rx and handicapp sticker will be ready for pick up at the front desk tomorrow AM. Patient aware.

## 2017-09-06 NOTE — Telephone Encounter (Signed)
Tylenol #3 given instead

## 2017-09-15 ENCOUNTER — Ambulatory Visit
Admission: RE | Admit: 2017-09-15 | Discharge: 2017-09-15 | Disposition: A | Discharge status: Home / Self Care | Payer: 59 | Source: Ambulatory Visit | Attending: Orthopaedic Surgery | Admitting: Orthopaedic Surgery

## 2017-09-15 DIAGNOSIS — M48061 Spinal stenosis, lumbar region without neurogenic claudication: Secondary | ICD-10-CM | POA: Diagnosis not present

## 2017-09-15 DIAGNOSIS — G8929 Other chronic pain: Secondary | ICD-10-CM

## 2017-09-15 DIAGNOSIS — M545 Low back pain: Principal | ICD-10-CM

## 2017-09-15 NOTE — Progress Notes (Signed)
MRI results

## 2017-09-19 DIAGNOSIS — K210 Gastro-esophageal reflux disease with esophagitis: Secondary | ICD-10-CM | POA: Diagnosis not present

## 2017-09-19 DIAGNOSIS — R6881 Early satiety: Secondary | ICD-10-CM | POA: Diagnosis not present

## 2017-09-19 DIAGNOSIS — R1013 Epigastric pain: Secondary | ICD-10-CM | POA: Diagnosis not present

## 2017-09-20 ENCOUNTER — Ambulatory Visit (INDEPENDENT_AMBULATORY_CARE_PROVIDER_SITE_OTHER): Payer: 59 | Admitting: Orthopaedic Surgery

## 2017-09-20 ENCOUNTER — Encounter (INDEPENDENT_AMBULATORY_CARE_PROVIDER_SITE_OTHER): Payer: Self-pay | Admitting: Orthopaedic Surgery

## 2017-09-20 DIAGNOSIS — G8929 Other chronic pain: Secondary | ICD-10-CM

## 2017-09-20 DIAGNOSIS — M5440 Lumbago with sciatica, unspecified side: Secondary | ICD-10-CM

## 2017-09-20 NOTE — Progress Notes (Signed)
Vanessa Medina comes in to discuss MRI results of her lumbar spine.  She has been having significant lumbar pain with radiculopathy.  MRI from 09/15/2017 reveals a broad-based disc protrusion at L3-4 as well as a mild rightward disc bulge at L4-5.  At this point we feel is appropriate to refer her to Dr. Ernestina Patches for epidural steroid injections.  She will follow-up with [name] a few weeks after her injections and we will look into her new onset left upper extremity pain.

## 2017-10-01 ENCOUNTER — Encounter (INDEPENDENT_AMBULATORY_CARE_PROVIDER_SITE_OTHER): Payer: Self-pay | Admitting: Physical Medicine and Rehabilitation

## 2017-10-01 ENCOUNTER — Ambulatory Visit (INDEPENDENT_AMBULATORY_CARE_PROVIDER_SITE_OTHER): Payer: Self-pay

## 2017-10-01 ENCOUNTER — Ambulatory Visit (INDEPENDENT_AMBULATORY_CARE_PROVIDER_SITE_OTHER): Payer: 59 | Admitting: Physical Medicine and Rehabilitation

## 2017-10-01 VITALS — BP 123/72 | HR 68

## 2017-10-01 DIAGNOSIS — M5116 Intervertebral disc disorders with radiculopathy, lumbar region: Secondary | ICD-10-CM

## 2017-10-01 DIAGNOSIS — M5416 Radiculopathy, lumbar region: Secondary | ICD-10-CM

## 2017-10-01 MED ORDER — METHYLPREDNISOLONE ACETATE 80 MG/ML IJ SUSP
80.0000 mg | Freq: Once | INTRAMUSCULAR | Status: AC
  Administered 2017-10-01: 80 mg

## 2017-10-01 NOTE — Progress Notes (Deleted)
Lower back pain for several years off and on  but persistent pain since she had a fall in December.  Left side pain down leg to foot. Numbness in thigh. Constant pain. Can't get any relief. Taking Ibuprofen.

## 2017-10-01 NOTE — Patient Instructions (Signed)

## 2017-10-02 NOTE — Procedures (Signed)
Vanessa Medina is a 47 year old female who comes in today at the request of Dr. Erlinda Hong for epidural injection for left radicular type low back and leg pain.  She reports pain for over 7 years without any relief whatsoever.  However when asking her specifically she reports persistent worsening pain since a fall in December.  She does get pain down to the left foot and numbness in the thigh.  Her pain is constant.  MRI findings reviewed below but there is no significant left-sided nerve compression or disc herniation.  She does suffer from depression with morbid obesity as well as a history of multiple drug allergies and intolerances.  She does report that she has a fairly high pain tolerance.  She does carry a diagnosis of contrast dye allergy.  This was not an anaphylactic or with a rash.  We will use a very small amount of contrast in the epidural space.  Lumbar Epidural Steroid Injection - Interlaminar Approach with Fluoroscopic Guidance  Patient: Vanessa Medina      Date of Birth: 03/12/1971 MRN: [id number] PCP: Maryland Pink, MD      Visit Date: 10/01/2017   Universal Protocol:     Consent Given By: the patient  Position: PRONE  Additional Comments: Vital signs were monitored before and after the procedure. Patient was prepped and draped in the usual sterile fashion. The correct patient, procedure, and site was verified.   Injection Procedure Details:  Procedure Site One Meds Administered:  Meds ordered this encounter  Medications  . methylPREDNISolone acetate (DEPO-MEDROL) injection 80 mg     Laterality: Left  Location/Site:  L5-S1  Needle size: 20 G  Needle type: Tuohy  Needle Placement: Paramedian epidural  Findings:   -Comments: Excellent flow of contrast into the epidural space.  Procedure Details: Using a paramedian approach from the side mentioned above, the region overlying the inferior lamina was localized under fluoroscopic visualization and the soft tissues  overlying this structure were infiltrated with 4 ml. of 1% Lidocaine without Epinephrine. The Tuohy needle was inserted into the epidural space using a paramedian approach.   The epidural space was localized using loss of resistance along with lateral and bi-planar fluoroscopic views.  After negative aspirate for air, blood, and CSF, a 2 ml. volume of Isovue-250 was injected into the epidural space and the flow of contrast was observed. Radiographs were obtained for documentation purposes.    The injectate was administered into the level noted above.   Additional Comments:  The patient tolerated the procedure well Dressing: Band-Aid    Post-procedure details: Patient was observed during the procedure. Post-procedure instructions were reviewed.  Patient left the clinic in stable condition.  Pertinent Imaging: MRI LUMBAR SPINE WITHOUT CONTRAST  TECHNIQUE: Multiplanar, multisequence MR imaging of the lumbar spine was performed. No intravenous contrast was administered.  COMPARISON:  MRI of lumbar spine 04/08/2010.  FINDINGS: Segmentation: 5 non rib-bearing lumbar type vertebral bodies are present.  Alignment: Slight retrolisthesis is again noted at L3-4. AP alignment is otherwise anatomic.  Vertebrae: Progressive chronic endplate marrow change and Schmorl's nodes are present at L3-4. Remote Schmorl's nodes are present at L1-2. Marrow signal and vertebral body heights are otherwise normal.  Conus medullaris and cauda equina: Conus extends to the L1 level. Conus and cauda equina appear normal.  Paraspinal and other soft tissues: Limited imaging of the abdomen is unremarkable. There is no significant adenopathy.  Disc levels:  L1-2: Negative.  L2-3: Desiccation and slight bulge is present without  significant stenosis.  L3-4: A progressive broad-based disc protrusion is present. Mild facet hypertrophy is again noted bilaterally. The disc extends into the  neural foramina bilaterally, worse than on the prior study. There is no significant compressive stenosis.  L4-5: Mild disc bulging is present. The disc extends into the right neural foramen without significant stenosis.  L5-S1: Negative.  IMPRESSION: 1. Progressive endplate degenerative change and broad-based disc protrusion at L3-4. The disc extends into the neural foramina bilaterally without significant compressive stenosis. 2. Mild rightward disc bulging at L4-5 extends into the right neural foramen without significant stenosis. 3. No significant left-sided disease to account for left radicular symptoms.   Electronically Signed   By: San Morelle M.D.   On: 09/15/2017 12:39

## 2017-10-29 ENCOUNTER — Encounter (INDEPENDENT_AMBULATORY_CARE_PROVIDER_SITE_OTHER): Payer: Self-pay | Admitting: Orthopaedic Surgery

## 2017-10-29 ENCOUNTER — Ambulatory Visit (INDEPENDENT_AMBULATORY_CARE_PROVIDER_SITE_OTHER): Payer: 59 | Admitting: Orthopaedic Surgery

## 2017-10-29 DIAGNOSIS — M545 Low back pain: Secondary | ICD-10-CM

## 2017-10-29 DIAGNOSIS — G8929 Other chronic pain: Secondary | ICD-10-CM | POA: Diagnosis not present

## 2017-10-29 NOTE — Progress Notes (Signed)
Office Visit Note   Patient: Vanessa Medina           Date of Birth: 1970/10/06           MRN: [id number] Visit Date: 10/29/2017              Requested by: Maryland Pink, MD [address] [place], [place] [address] PCP: Maryland Pink, MD   Assessment & Plan: Visit Diagnoses:  1. Chronic low back pain, unspecified back pain laterality, with sciatica presence unspecified     Plan: Impression is continued low back pain with left sided radiculopathy.  At this point patient is not interested in any other injections.  Therefore we will make a referral to Dr. Kathyrn Sheriff for surgical evaluation.  Questions encouraged and answered.  Follow-up as needed.  Follow-Up Instructions: Return if symptoms worsen or fail to improve.   Orders:  Orders Placed This Encounter  Procedures  . Ambulatory referral to Neurosurgery   No orders of the defined types were placed in this encounter.     Procedures: No procedures performed   Clinical Data: No additional findings.   Subjective: Chief Complaint  Patient presents with  . Lower Back - Pain, Follow-up    [name] follows up today for her continued low back pain and left leg radiculopathy.  She did get 3 days of relief from an epidural steroid injection with Dr Ernestina Patches..  She continues to have chronic pain.    Review of Systems   Objective: Vital Signs: There were no vitals taken for this visit.  Physical Exam  Ortho Exam Exam is stable. Specialty Comments:  No specialty comments available.  Imaging: No results found.   PMFS History: Patient Active Problem List   Diagnosis Date Noted  . Chronic low back pain 09/03/2017  . History of total hip replacement, left 01/26/2016  . EDEMA 04/28/2008  . LEG PAIN, BILATERAL 12/16/2007  . CERVICAL CANCER 07/02/2007  . MORBID OBESITY 07/02/2007  . DEPRESSION 07/02/2007  . COMMON MIGRAINE 07/02/2007  . ALLERGIC RHINITIS 07/02/2007  . ASTHMA 07/02/2007  .  GERD 07/02/2007  . ELEVATED BLOOD PRESSURE WITHOUT DIAGNOSIS OF HYPERTENSION 07/02/2007   Past Medical History:  Diagnosis Date  . Anemia   . Cervical cancer (Vanessa Medina)   . Family history of adverse reaction to anesthesia     " my mother takes a long time time wake up"  . Headache    migraine  . History of shingles   . Hypertension   . Migraine   . Multiple allergies   . Obesity   . Osteoarthritis    left hip  . PONV (postoperative nausea and vomiting)   . Sleep apnea    does not wear CPAP  . Wears glasses     Family History  Problem Relation Age of Onset  . Renal Disease Mother   . Hypertension Mother   . Sudden Cardiac Death Mother   . Heart failure Mother   . Valvular heart disease Mother   . Heart disease Mother   . Stroke Brother   . Heart attack Brother 35  . Diabetes Other   . Breast cancer Cousin        paternal side    Past Surgical History:  Procedure Laterality Date  . ABDOMINAL HYSTERECTOMY    . APPENDECTOMY    . CHOLECYSTECTOMY    . DIAGNOSTIC LAPAROSCOPY     LOA  . ESOPHAGOGASTRODUODENOSCOPY (EGD) WITH PROPOFOL N/A 07/25/2017  Procedure: ESOPHAGOGASTRODUODENOSCOPY (EGD) WITH PROPOFOL;  Surgeon: Manya Silvas, MD;  Location[facility];  Service: Endoscopy;  Laterality: N/A;  . KNEE ARTHROSCOPY    . LAPAROSCOPIC GASTRIC SLEEVE RESECTION WITH HIATAL HERNIA REPAIR    . TONSILLECTOMY    . TOTAL HIP ARTHROPLASTY Left 01/26/2016   Procedure: LEFT TOTAL HIP ARTHROPLASTY ANTERIOR APPROACH;  Surgeon: Leandrew Koyanagi, MD;  Location: [place];  Service: Orthopedics;  Laterality: Left;   Social History   Occupational History  . Not on file  Tobacco Use  . Smoking status: Never Smoker  . Smokeless tobacco: Never Used  Substance and Sexual Activity  . Alcohol use: Yes    Comment: occasional wine  . Drug use: No  . Sexual activity: Not on file

## 2017-11-14 DIAGNOSIS — M5136 Other intervertebral disc degeneration, lumbar region: Secondary | ICD-10-CM | POA: Diagnosis not present

## 2017-11-14 DIAGNOSIS — G8929 Other chronic pain: Secondary | ICD-10-CM | POA: Diagnosis not present

## 2017-11-14 DIAGNOSIS — M545 Low back pain: Secondary | ICD-10-CM | POA: Diagnosis not present

## 2017-11-14 DIAGNOSIS — M4316 Spondylolisthesis, lumbar region: Secondary | ICD-10-CM | POA: Diagnosis not present

## 2017-11-23 ENCOUNTER — Other Ambulatory Visit: Payer: Self-pay | Admitting: Neurosurgery

## 2017-11-23 DIAGNOSIS — M5136 Other intervertebral disc degeneration, lumbar region: Secondary | ICD-10-CM

## 2017-12-03 ENCOUNTER — Other Ambulatory Visit (INDEPENDENT_AMBULATORY_CARE_PROVIDER_SITE_OTHER): Payer: Self-pay | Admitting: Orthopaedic Surgery

## 2017-12-05 ENCOUNTER — Other Ambulatory Visit (INDEPENDENT_AMBULATORY_CARE_PROVIDER_SITE_OTHER): Payer: Self-pay

## 2017-12-10 DIAGNOSIS — G4700 Insomnia, unspecified: Secondary | ICD-10-CM | POA: Diagnosis not present

## 2017-12-11 DIAGNOSIS — M545 Low back pain: Secondary | ICD-10-CM | POA: Diagnosis not present

## 2017-12-11 DIAGNOSIS — M5136 Other intervertebral disc degeneration, lumbar region: Secondary | ICD-10-CM | POA: Diagnosis not present

## 2017-12-20 DIAGNOSIS — M5136 Other intervertebral disc degeneration, lumbar region: Secondary | ICD-10-CM | POA: Diagnosis not present

## 2017-12-22 ENCOUNTER — Other Ambulatory Visit (INDEPENDENT_AMBULATORY_CARE_PROVIDER_SITE_OTHER): Payer: Self-pay | Admitting: Physician Assistant

## 2018-01-07 DIAGNOSIS — Z566 Other physical and mental strain related to work: Secondary | ICD-10-CM | POA: Diagnosis not present

## 2018-01-18 ENCOUNTER — Encounter: Payer: Self-pay | Admitting: Dermatology

## 2018-01-18 DIAGNOSIS — D485 Neoplasm of uncertain behavior of skin: Secondary | ICD-10-CM | POA: Diagnosis not present

## 2018-01-18 DIAGNOSIS — D2261 Melanocytic nevi of right upper limb, including shoulder: Secondary | ICD-10-CM | POA: Diagnosis not present

## 2018-01-18 DIAGNOSIS — Z86018 Personal history of other benign neoplasm: Secondary | ICD-10-CM

## 2018-01-18 DIAGNOSIS — Z1283 Encounter for screening for malignant neoplasm of skin: Secondary | ICD-10-CM | POA: Diagnosis not present

## 2018-01-18 DIAGNOSIS — L720 Epidermal cyst: Secondary | ICD-10-CM | POA: Diagnosis not present

## 2018-01-18 DIAGNOSIS — D2272 Melanocytic nevi of left lower limb, including hip: Secondary | ICD-10-CM | POA: Diagnosis not present

## 2018-01-18 HISTORY — DX: Personal history of other benign neoplasm: Z86.018

## 2018-02-06 ENCOUNTER — Telehealth: Payer: Self-pay | Admitting: Cardiovascular Disease

## 2018-02-06 DIAGNOSIS — H04123 Dry eye syndrome of bilateral lacrimal glands: Secondary | ICD-10-CM | POA: Diagnosis not present

## 2018-02-06 DIAGNOSIS — H2512 Age-related nuclear cataract, left eye: Secondary | ICD-10-CM | POA: Diagnosis not present

## 2018-02-06 DIAGNOSIS — H2513 Age-related nuclear cataract, bilateral: Secondary | ICD-10-CM | POA: Diagnosis not present

## 2018-02-06 NOTE — Telephone Encounter (Signed)
° °  [facility] Pre-operative Risk Assessment    Request for surgical clearance:  1. What type of surgery is being performed? Cataract Extraction w / intraocular lens implantation of Left Eye followed by right Eye   2. When is this surgery scheduled? 03-19-18 and 03-26-18  3. What type of clearance is required (medical clearance vs. Pharmacy clearance to hold med vs. Both)? Medical   4. Are there any medications that need to be held prior to surgery and how long? None   5. Practice name and name of physician performing surgery? [facility]   6. What is your office phone number [phone]   7.   What is your office fax number   [phone]  8.   Anesthesia type (None, local, MAC, general) ? Topical anesth w/ iv medication    Clarisse Gouge 02/06/2018, 4:57 PM  _________________________________________________________________   (provider comments below)

## 2018-02-06 NOTE — Telephone Encounter (Signed)
Patient last seen in office by Dr Rockey Situ on 03/28/17. Patient will need appointment. Will call tomorrow to arrange.

## 2018-02-07 DIAGNOSIS — W5503XA Scratched by cat, initial encounter: Secondary | ICD-10-CM | POA: Diagnosis not present

## 2018-02-07 DIAGNOSIS — L03114 Cellulitis of left upper limb: Secondary | ICD-10-CM | POA: Diagnosis not present

## 2018-02-07 NOTE — Telephone Encounter (Signed)
Scheduled 7/8 [facility]

## 2018-02-07 NOTE — Telephone Encounter (Signed)
No voicemail on mobile number, no answer. Left message to call back on work number. Routing to scheduling pool to contact patient for appointment. Patient may need to see [facility] or APP before 03/19/18 when procedure is. Let us know if you need help with appointment placement. Thanks!

## 2018-02-16 ENCOUNTER — Other Ambulatory Visit (INDEPENDENT_AMBULATORY_CARE_PROVIDER_SITE_OTHER): Payer: Self-pay | Admitting: Physician Assistant

## 2018-02-27 ENCOUNTER — Telehealth: Payer: Self-pay | Admitting: Cardiovascular Disease

## 2018-02-27 NOTE — Telephone Encounter (Signed)
Patient has appointment with Dr Rockey Situ on 03/11/18. Due for 12 month f/u.

## 2018-02-27 NOTE — Telephone Encounter (Signed)
° °  [facility] Pre-operative Risk Assessment    Request for surgical clearance:  1. What type of surgery is being performed? Cataract extraction w/ intraocular lens implantation of the left eye followed by Right eye  2. When is this surgery scheduled? 03/19/18 and 03/26/18    3. What type of clearance is required (medical clearance vs. Pharmacy clearance to hold med vs. Both)? Medical   4. Are there any medications that need to be held prior to surgery and how long?  5. Practice name and name of physician performing surgery? [facility]   6. What is your office phone number [phone]   7.   What is your office fax number [phone]  8.   Anesthesia type (None, local, MAC, general) ? Not listed

## 2018-03-05 ENCOUNTER — Telehealth (INDEPENDENT_AMBULATORY_CARE_PROVIDER_SITE_OTHER): Payer: Self-pay | Admitting: Orthopaedic Surgery

## 2018-03-05 NOTE — Telephone Encounter (Signed)
Patient requesting her handicap placard be renewed. CB # [phone]

## 2018-03-06 NOTE — Telephone Encounter (Signed)
1year

## 2018-03-06 NOTE — Telephone Encounter (Signed)
Please advise. If so, for how long?

## 2018-03-06 NOTE — Telephone Encounter (Signed)
Ready for pick up at the front desk. Pt aware.

## 2018-03-08 DIAGNOSIS — R0989 Other specified symptoms and signs involving the circulatory and respiratory systems: Secondary | ICD-10-CM | POA: Insufficient documentation

## 2018-03-08 NOTE — Progress Notes (Signed)
Cardiology Office Note  Date:  03/11/2018   ID:  Vanessa Medina, DOB 1971/06/25, MRN [id number]  PCP:  Maryland Pink, MD   Chief Complaint  Patient presents with  . other    Cardiac clearance for Cataract Extraction w / intraocular lens implantation of Left Eye followed by right Eye at [facility].  Meds reviewed by the pt. verbally. Pt. c/o decrease in BP with feeling dizzy at times, chest pain, tingling in arms & hands.    HPI:  Vanessa Medina is a 47 y.o. Female Nonsmoker No diabetes With history of hypertension, Morbid obesity History of gastric sleeve 3 years ago Who presents for follow-up of her labile blood pressure, ypotension and orthostasis  Reports having symptomatic hypotension Taking midodrine 10 mg dailyin the morning only Lost 100 pounds after gastric sleeve Has adhesions, scaring Chronic abdominal pain Tries to stay hydrated Concerned about losing any more weight given her low blood pressure, has not been trying  Scheduled for eye surgery July 15th and 23rd  Reported she could not do gastric bypass as she had too many adhesions. Dr. Duke Salvia was the surgeon.    EKG personally reviewed by myself on todays visit Shows normal sinus rhythm with rate 74 bpm no significant ST or T-wave changes  Previous office visit had positive orthostatics  PMH:   has a past medical history of Anemia, Cervical cancer ([place]), Family history of adverse reaction to anesthesia, Headache, History of shingles, Hypertension, Migraine, Multiple allergies, Obesity, Osteoarthritis, PONV (postoperative nausea and vomiting), Sleep apnea, and Wears glasses.  PSH:    Past Surgical History:  Procedure Laterality Date  . ABDOMINAL HYSTERECTOMY    . APPENDECTOMY    . CHOLECYSTECTOMY    . DIAGNOSTIC LAPAROSCOPY     LOA  . ESOPHAGOGASTRODUODENOSCOPY (EGD) WITH PROPOFOL N/A 07/25/2017   Procedure: ESOPHAGOGASTRODUODENOSCOPY (EGD) WITH PROPOFOL;  Surgeon: Manya Silvas,  MD;  Location: [facility] ENDOSCOPY;  Service: Endoscopy;  Laterality: N/A;  . KNEE ARTHROSCOPY    . LAPAROSCOPIC GASTRIC SLEEVE RESECTION WITH HIATAL HERNIA REPAIR    . TONSILLECTOMY    . TOTAL HIP ARTHROPLASTY Left 01/26/2016   Procedure: LEFT TOTAL HIP ARTHROPLASTY ANTERIOR APPROACH;  Surgeon: Leandrew Koyanagi, MD;  Location: [facility];  Service: Orthopedics;  Laterality: Left;    Current Outpatient Medications  Medication Sig Dispense Refill  . acetaminophen-codeine (TYLENOL #3) 300-30 MG tablet TAKE 1 TABLET BY MOUTH TWICE DAILY AS NEEDED FOR MODERATE PAIN 30 tablet 0  . Calcium Carbonate-Vitamin D (CALTRATE 600+D PO) Take 1 tablet by mouth daily.     . clindamycin (CLEOCIN) 150 MG capsule Take 4 tabs one hour before dental procedure 12 capsule 1  . cyanocobalamin (,VITAMIN B-12,) 1000 MCG/ML injection Inject 1,000 mcg into the muscle every 30 (thirty) days.    . meloxicam (MOBIC) 7.5 MG tablet TAKE 1 TABLET BY MOUTH ONCE DAILY IF NEEDED FOR PAIN 30 tablet 0  . midodrine (PROAMATINE) 10 MG tablet Take 1 tablet (10 mg total) by mouth 3 (three) times daily. 270 tablet 3  . Multiple Vitamins-Minerals (MULTIVITAMIN WITH MINERALS) tablet Take 1 tablet by mouth daily.    . pramipexole (MIRAPEX) 0.125 MG tablet Take 0.5 mg by mouth at bedtime. Pt takes 4 tablets at bedtime    . topiramate (TOPAMAX) 25 MG tablet Take 25 mg by mouth 2 (two) times daily.    . traMADol (ULTRAM) 50 MG tablet Take 1 tablet (50 mg total) by mouth every 6 (  six) hours as needed. 60 tablet 0  . Vitamin D, Ergocalciferol, (DRISDOL) 50000 units CAPS capsule Take 50,000 Units by mouth every 7 (seven) days. Sundays     No current facility-administered medications for this visit.      Allergies:   Shellfish allergy; Meperidine hcl; Morphine; Bee pollen; Ivp dye [iodinated diagnostic agents]; Codeine; Oxycodone-acetaminophen; Pentazocine lactate; and Propoxyphene n-acetaminophen   Social History:  The patient  reports that she has never  smoked. She has never used smokeless tobacco. She reports that she drinks alcohol. She reports that she does not use drugs.   Family History:   family history includes Breast cancer in her cousin; Diabetes in her other; Heart attack (age of onset: 9) in her brother; Heart disease in her mother; Heart failure in her mother; Hypertension in her mother; Renal Disease in her mother; Stroke in her brother; Sudden Cardiac Death in her mother; Valvular heart disease in her mother.    Review of Systems: Review of Systems  Constitutional: Negative.   Respiratory: Negative.   Cardiovascular: Negative.   Gastrointestinal: Positive for abdominal pain.  Musculoskeletal: Negative.   Neurological: Positive for dizziness.  Psychiatric/Behavioral: Negative.   All other systems reviewed and are negative.    PHYSICAL EXAM: VS:  BP 92/60 (BP Location: Left Arm, Patient Position: Sitting, Cuff Size: Large)   Pulse 74   Ht 5\' 4"  (1.626 m)   Wt 267 lb 8 oz (121.3 kg)   BMI 45.92 kg/m  , BMI Body mass index is 45.92 kg/m. Constitutional:  oriented to person, place, and time. No distress. obese HENT:  Head: Normocephalic and atraumatic.  Eyes:  no discharge. No scleral icterus.  Neck: Normal range of motion. Neck supple. No JVD present.  Cardiovascular: Normal rate, regular rhythm, normal heart sounds and intact distal pulses. Exam reveals no gallop and no friction rub. No edema No murmur heard. Pulmonary/Chest: Effort normal and breath sounds normal. No stridor. No respiratory distress.  no wheezes.  no rales.  no tenderness.  Abdominal: Soft.  no distension.  no tenderness.  Musculoskeletal: Normal range of motion.  no  tenderness or deformity.  Neurological:  normal muscle tone. Coordination normal. No atrophy Skin: Skin is warm and dry. No rash noted. not diaphoretic.  Psychiatric:  normal mood and affect. behavior is normal. Thought content normal.    Recent Labs: No results found for  requested labs within last 8760 hours.    Lipid Panel No results found for: CHOL, HDL, LDLCALC, TRIG    Wt Readings from Last 3 Encounters:  03/11/18 267 lb 8 oz (121.3 kg)  07/25/17 260 lb (117.9 kg)  03/28/17 273 lb (123.8 kg)      ASSESSMENT AND PLAN:  Orthostatic hypotension - Plan: EKG 12-Lead Reason for her drop in pressure likely from rapid weight loss. Recommended she continue high fluid and salt intake All blood pressure medications have been held Would increase midodrine up to 10 mg 3 times a day If no improvement of her symptoms we could add low-dose fludrocortisone She will call us with blood pressure measurements over the next several weeks  Family history of heart attack - Plan: EKG 12-Lead Nonsmoker, no diabetes. Low risk of coronary disease Normal EKG No further workup at this time  Gastric sleeve/bypass surgery previous hiatal hernia repair,  Hen gastric sleeve Chronic abdominal pain felt secondary to adhesions  Disposition:   F/U one year She'll call if blood pressure numbers   Total encounter time more than  25 minutes  Greater than 50% was spent in counseling and coordination of care with the patient     Orders Placed This Encounter  Procedures  . EKG 12-Lead     Signed, Esmond Plants, M.D., Ph.D. 03/11/2018  [place], [place]

## 2018-03-11 ENCOUNTER — Ambulatory Visit: Payer: 59 | Admitting: Cardiovascular Disease

## 2018-03-11 ENCOUNTER — Encounter: Payer: Self-pay | Admitting: Cardiovascular Disease

## 2018-03-11 ENCOUNTER — Telehealth (INDEPENDENT_AMBULATORY_CARE_PROVIDER_SITE_OTHER): Payer: Self-pay | Admitting: Orthopaedic Surgery

## 2018-03-11 DIAGNOSIS — R600 Localized edema: Secondary | ICD-10-CM

## 2018-03-11 DIAGNOSIS — K219 Gastro-esophageal reflux disease without esophagitis: Secondary | ICD-10-CM | POA: Diagnosis not present

## 2018-03-11 DIAGNOSIS — R0989 Other specified symptoms and signs involving the circulatory and respiratory systems: Secondary | ICD-10-CM

## 2018-03-11 MED ORDER — MIDODRINE HCL 10 MG PO TABS: 10.0000 mg | ORAL_TABLET | Freq: Three times a day (TID) | ORAL | 3 refills | Status: DC

## 2018-03-11 NOTE — Patient Instructions (Addendum)
Medication Instructions:   Consider taking midodrine 1 pill (10 mg) three times a day Check pressure sitting and standing If standing blood pressure drops <100, call the office  Research florinef/fludrocortisone  Labwork:  No new labs needed  Testing/Procedures:  No further testing at this time   Follow-Up: It was a pleasure seeing you in the office today. Please call us if you have new issues that need to be addressed before your next appt.  [phone]  Your physician wants you to follow-up in: 12 months.  You will receive a reminder letter in the mail two months in advance. If you don't receive a letter, please call our office to schedule the follow-up appointment.  If you need a refill on your cardiac medications before your next appointment, please call your pharmacy.  For educational health videos Log in to : [email or web address] Or : [email or web address], password : [email or web address]

## 2018-03-11 NOTE — Telephone Encounter (Signed)
Patient called wanting to know if she needs to take antibiotics before having her eye surgery on July 16th and also on July 23rd.  CB#[phone].  Thank you.

## 2018-03-11 NOTE — Telephone Encounter (Signed)
Called no answer LMOM.

## 2018-03-11 NOTE — Telephone Encounter (Signed)
Please advise 

## 2018-03-11 NOTE — Telephone Encounter (Signed)
She is beyond 2 years from her hip replacement so no she does not need any

## 2018-03-12 NOTE — Telephone Encounter (Signed)
Clearance routed to number provided.  

## 2018-03-12 NOTE — Telephone Encounter (Signed)
Acceptable risk for cataract surgery No further testing needed

## 2018-03-18 ENCOUNTER — Telehealth (INDEPENDENT_AMBULATORY_CARE_PROVIDER_SITE_OTHER): Payer: Self-pay

## 2018-03-18 NOTE — Telephone Encounter (Signed)
Patient called stating that for the Handicap Placard Renewal, its has to be checked for 6 months or 90yrs.  Stated that a new form needs to be completed and faxed to [phone].  Cb# is [phone].  Please advise.

## 2018-03-19 DIAGNOSIS — H25812 Combined forms of age-related cataract, left eye: Secondary | ICD-10-CM | POA: Diagnosis not present

## 2018-03-19 DIAGNOSIS — H2511 Age-related nuclear cataract, right eye: Secondary | ICD-10-CM | POA: Diagnosis not present

## 2018-03-19 DIAGNOSIS — H2512 Age-related nuclear cataract, left eye: Secondary | ICD-10-CM | POA: Diagnosis not present

## 2018-03-19 NOTE — Telephone Encounter (Signed)
Faxed yesterday

## 2018-03-26 DIAGNOSIS — H25811 Combined forms of age-related cataract, right eye: Secondary | ICD-10-CM | POA: Diagnosis not present

## 2018-03-26 DIAGNOSIS — H2511 Age-related nuclear cataract, right eye: Secondary | ICD-10-CM | POA: Diagnosis not present

## 2018-04-17 DIAGNOSIS — Z9884 Bariatric surgery status: Secondary | ICD-10-CM | POA: Insufficient documentation

## 2018-04-17 DIAGNOSIS — Z6841 Body Mass Index (BMI) 40.0 and over, adult: Secondary | ICD-10-CM | POA: Insufficient documentation

## 2018-04-24 DIAGNOSIS — Z9884 Bariatric surgery status: Secondary | ICD-10-CM | POA: Diagnosis not present

## 2018-04-24 DIAGNOSIS — Z6841 Body Mass Index (BMI) 40.0 and over, adult: Secondary | ICD-10-CM | POA: Diagnosis not present

## 2018-04-24 DIAGNOSIS — K219 Gastro-esophageal reflux disease without esophagitis: Secondary | ICD-10-CM | POA: Diagnosis not present

## 2018-05-07 DIAGNOSIS — Z6841 Body Mass Index (BMI) 40.0 and over, adult: Secondary | ICD-10-CM | POA: Diagnosis not present

## 2018-05-07 DIAGNOSIS — K219 Gastro-esophageal reflux disease without esophagitis: Secondary | ICD-10-CM | POA: Diagnosis not present

## 2018-05-22 ENCOUNTER — Other Ambulatory Visit: Payer: Self-pay | Admitting: Obstetrics and Gynecology

## 2018-05-22 DIAGNOSIS — Z1211 Encounter for screening for malignant neoplasm of colon: Secondary | ICD-10-CM | POA: Diagnosis not present

## 2018-05-22 DIAGNOSIS — N6310 Unspecified lump in the right breast, unspecified quadrant: Secondary | ICD-10-CM

## 2018-05-22 DIAGNOSIS — Z01411 Encounter for gynecological examination (general) (routine) with abnormal findings: Secondary | ICD-10-CM | POA: Diagnosis not present

## 2018-05-28 DIAGNOSIS — Z6841 Body Mass Index (BMI) 40.0 and over, adult: Secondary | ICD-10-CM | POA: Diagnosis not present

## 2018-05-28 DIAGNOSIS — Z01818 Encounter for other preprocedural examination: Secondary | ICD-10-CM | POA: Diagnosis not present

## 2018-05-28 DIAGNOSIS — Z9884 Bariatric surgery status: Secondary | ICD-10-CM | POA: Diagnosis not present

## 2018-05-28 DIAGNOSIS — K219 Gastro-esophageal reflux disease without esophagitis: Secondary | ICD-10-CM | POA: Diagnosis not present

## 2018-05-29 ENCOUNTER — Ambulatory Visit
Admission: RE | Admit: 2018-05-29 | Discharge: 2018-05-29 | Disposition: A | Discharge status: Home / Self Care | Payer: 59 | Source: Ambulatory Visit | Attending: Obstetrics and Gynecology | Admitting: Obstetrics and Gynecology

## 2018-05-29 DIAGNOSIS — N6320 Unspecified lump in the left breast, unspecified quadrant: Secondary | ICD-10-CM | POA: Diagnosis not present

## 2018-05-29 DIAGNOSIS — R928 Other abnormal and inconclusive findings on diagnostic imaging of breast: Secondary | ICD-10-CM | POA: Diagnosis not present

## 2018-05-29 DIAGNOSIS — N6310 Unspecified lump in the right breast, unspecified quadrant: Secondary | ICD-10-CM | POA: Insufficient documentation

## 2018-06-19 DIAGNOSIS — K219 Gastro-esophageal reflux disease without esophagitis: Secondary | ICD-10-CM | POA: Diagnosis not present

## 2018-06-19 DIAGNOSIS — Z9884 Bariatric surgery status: Secondary | ICD-10-CM | POA: Diagnosis not present

## 2018-07-10 ENCOUNTER — Other Ambulatory Visit: Payer: Self-pay | Admitting: Obstetrics and Gynecology

## 2018-07-10 DIAGNOSIS — N6002 Solitary cyst of left breast: Secondary | ICD-10-CM | POA: Diagnosis not present

## 2018-07-11 ENCOUNTER — Other Ambulatory Visit: Payer: Self-pay | Admitting: Obstetrics and Gynecology

## 2018-07-11 DIAGNOSIS — N6002 Solitary cyst of left breast: Secondary | ICD-10-CM

## 2018-07-23 ENCOUNTER — Ambulatory Visit
Admission: RE | Admit: 2018-07-23 | Discharge: 2018-07-23 | Disposition: A | Discharge status: Home / Self Care | Payer: 59 | Source: Ambulatory Visit | Attending: Obstetrics and Gynecology | Admitting: Obstetrics and Gynecology

## 2018-07-23 DIAGNOSIS — N6002 Solitary cyst of left breast: Secondary | ICD-10-CM | POA: Diagnosis not present

## 2018-07-23 DIAGNOSIS — R928 Other abnormal and inconclusive findings on diagnostic imaging of breast: Secondary | ICD-10-CM | POA: Diagnosis not present

## 2018-07-23 DIAGNOSIS — N6323 Unspecified lump in the left breast, lower outer quadrant: Secondary | ICD-10-CM | POA: Diagnosis not present

## 2018-07-23 DIAGNOSIS — N6324 Unspecified lump in the left breast, lower inner quadrant: Secondary | ICD-10-CM | POA: Diagnosis not present

## 2018-07-24 DIAGNOSIS — Z6841 Body Mass Index (BMI) 40.0 and over, adult: Secondary | ICD-10-CM | POA: Diagnosis not present

## 2018-07-24 DIAGNOSIS — K219 Gastro-esophageal reflux disease without esophagitis: Secondary | ICD-10-CM | POA: Diagnosis not present

## 2018-07-24 DIAGNOSIS — Z9884 Bariatric surgery status: Secondary | ICD-10-CM | POA: Diagnosis not present

## 2018-07-25 ENCOUNTER — Telehealth: Payer: Self-pay | Admitting: Cardiovascular Disease

## 2018-07-25 NOTE — Telephone Encounter (Signed)
° °  [facility] Pre-operative Risk Assessment    Request for surgical clearance:  1. What type of surgery is being performed? Duodenal Switch    2. When is this surgery scheduled?   3. What type of clearance is required (medical clearance vs. Pharmacy clearance to hold med vs. Both)? Medical   4. Are there any medications that need to be held prior to surgery and how long?   5. Practice name and name of physician performing surgery? [facility]  Dr Darnell Level   6. What is your office phone number [phone]     7.   What is your office fax number [phone]  8.   Anesthesia type (None, local, MAC, general) ?

## 2018-07-27 NOTE — Telephone Encounter (Signed)
In effort to get her ready for surgery, We need some BP measurements Was very low in office 03/2018 Does she need to be seen for med adjustment?

## 2018-07-29 NOTE — Telephone Encounter (Signed)
Scheduled with nurse for bp check 12/5 at 2 pm

## 2018-07-29 NOTE — Telephone Encounter (Signed)
Triage call in for surgical clearance, per triage message from [facility], last visit on 03/2018 BP was low. [place] wants current BP values. Call to patient. Pt denies taking recent BP and is unaware of current BP. She reports that she is currently still taking Midodrine 1 tablet (10 mg) by mouth 3 times daily.   Plan is to have pt come in for nurse visit to check BP and medication requisition.   Pt has next surgical consultation on Dec 3, 19.  Note forwarded to Dr. Rockey Situ and scheduling.

## 2018-08-04 NOTE — Telephone Encounter (Signed)
Need orthostatics. Could also be done at home if she can provide numbers

## 2018-08-05 NOTE — Telephone Encounter (Signed)
Called patient and offered her to see Christell Faith, PA since appt openings tomorrow. She is agreeable to come to the afternoon appointment.

## 2018-08-06 ENCOUNTER — Ambulatory Visit (INDEPENDENT_AMBULATORY_CARE_PROVIDER_SITE_OTHER): Payer: 59 | Admitting: Physician Assistant

## 2018-08-06 ENCOUNTER — Encounter: Payer: Self-pay | Admitting: Physician Assistant

## 2018-08-06 VITALS — BP 130/74 | HR 56 | Ht 65.0 in | Wt 267.0 lb

## 2018-08-06 DIAGNOSIS — I951 Orthostatic hypotension: Secondary | ICD-10-CM

## 2018-08-06 DIAGNOSIS — R0989 Other specified symptoms and signs involving the circulatory and respiratory systems: Secondary | ICD-10-CM

## 2018-08-06 DIAGNOSIS — Z8249 Family history of ischemic heart disease and other diseases of the circulatory system: Secondary | ICD-10-CM | POA: Diagnosis not present

## 2018-08-06 DIAGNOSIS — Z01810 Encounter for preprocedural cardiovascular examination: Secondary | ICD-10-CM | POA: Diagnosis not present

## 2018-08-06 DIAGNOSIS — Z6841 Body Mass Index (BMI) 40.0 and over, adult: Secondary | ICD-10-CM | POA: Diagnosis not present

## 2018-08-06 DIAGNOSIS — K449 Diaphragmatic hernia without obstruction or gangrene: Secondary | ICD-10-CM | POA: Diagnosis not present

## 2018-08-06 DIAGNOSIS — K219 Gastro-esophageal reflux disease without esophagitis: Secondary | ICD-10-CM | POA: Diagnosis not present

## 2018-08-06 NOTE — Patient Instructions (Signed)
Medication Instructions:  - Your physician recommends that you continue on your current medications as directed. Please refer to the Current Medication list given to you today.  If you need a refill on your cardiac medications before your next appointment, please call your pharmacy.   Lab work: - none ordered  If you have labs (blood work) drawn today and your tests are completely normal, you will receive your results only by: Marland Kitchen MyChart Message (if you have MyChart) OR . A paper copy in the mail If you have any lab test that is abnormal or we need to change your treatment, we will call you to review the results.  Testing/Procedures: - Your physician has requested that you have an echocardiogram. Echocardiography is a painless test that uses sound waves to create images of your heart. It provides your doctor with information about the size and shape of your heart and how well your heart's chambers and valves are working. This procedure takes approximately one hour. There are no restrictions for this procedure.  Follow-Up: At [facility], you and your health needs are our priority.  As part of our continuing mission to provide you with exceptional heart care, we have created designated Provider Care Teams.  These Care Teams include your primary Cardiologist (physician) and Advanced Practice Providers (APPs -  Physician Assistants and Nurse Practitioners) who all work together to provide you with the care you need, when you need it. . 3 months with Dr. Rockey Situ  Any Other Special Instructions Will Be Listed Below (If Applicable). - N/A

## 2018-08-06 NOTE — Progress Notes (Signed)
Cardiology Office Note Date:  08/06/2018  Patient ID:  Vanessa, Medina October 25, 1970, MRN [id number] PCP:  Maryland Pink, MD  Cardiologist:  Dr. Rockey Situ, MD    Chief Complaint: Preoperative cardiac evaluation  History of Present Illness: Vanessa Medina is a 47 y.o. female with history of labile hypertension with orthostatic hypotension, morbid obesity s/p gastric sleeve, chronic abdominal pain, family history of heart disease in her mother, cervical cancer, anemia, migraine disorder, and sleep apnea who presents for preoperative cardiac evaluation for bariatric surgery.  Patient has been managed with midodrine for symptomatic hypotension.  No prior echocardiogram or ischemic evaluation.  Prior notes indicate she has chronic abdominal pain with adhesions and scarring which has precluded gastric bypass surgery.  Notes indicate she attempts to stay hydrated.  She was last seen in the office on 03/11/2018 for evaluation of cataract surgery.  At that time, blood pressure was noted to be 92/60, heart rate 74 bpm, weight 267 pounds.  This was down from 273 pounds one year prior.  She was advised that she could take midodrine after 10 mg 3 times daily as well as to liberalize fluid and salt intake.  Labs: 04/2018 - WBC 6.2, hemoglobin 13.7, platelet count 377, LFT normal, LDL 134, sodium 139, potassium 4.2, BUN 17, serum creatinine 1.17  We received cardiac risk stratification request for upcoming bariatric surgery.  Upon our office contact the patient she did not have any recent BP measurements for review.  She reported she was taking midodrine 10 mg 3 times daily.  Appointment was made for today.  She comes in doing well today.  She denies any symptoms concerning for chest pain or shortness of breath.  She does continue to have some intermittent positional dizziness though notes this mostly on days in which she forgets to take her midodrine.  Over the past couple of months her home blood pressure  readings have been well controlled without significant drops with most readings in the low 673A to 193X systolic.  She has not had any presyncopal or syncopal episodes.  No palpitations.  She does tell me today that her mother had an MI in her 31s.  She is able to achieve at least 18.95 METs.  She has a stable 2 pillow orthopnea and denies any lower extremity swelling or PND.  She does try and stay hydrated and drinks approximately 64 ounces of water on a daily basis.  When she remembers, she does try and liberalize her salt intake.  She is planning to have a revision of her bariatric surgery.  Surgical date is to be determined.  [facility] medication list was compared to her lists at [facility] and [facility] and it was discovered the patient is only taking calcium carbonate/vitamin D supplement, midodrine 10 mg 3 times daily, multivitamin, Mirapex, and Topamax.  Patient is no longer taking lisinopril or Maxide as reported on outside medication list.   Past Medical History:  Diagnosis Date  . Anemia   . Cervical cancer ([place])   . Family history of adverse reaction to anesthesia     " my mother takes a long time time wake up"  . Headache    migraine  . History of shingles   . Hypertension   . Migraine   . Multiple allergies   . Obesity   . Osteoarthritis    left hip  . PONV (postoperative nausea and vomiting)   . Sleep apnea    does not wear CPAP  .  Wears glasses     Past Surgical History:  Procedure Laterality Date  . ABDOMINAL HYSTERECTOMY    . APPENDECTOMY    . CHOLECYSTECTOMY    . DIAGNOSTIC LAPAROSCOPY     LOA  . ESOPHAGOGASTRODUODENOSCOPY (EGD) WITH PROPOFOL N/A 07/25/2017   Procedure: ESOPHAGOGASTRODUODENOSCOPY (EGD) WITH PROPOFOL;  Surgeon: Manya Silvas, MD;  Location: [facility] ENDOSCOPY;  Service: Endoscopy;  Laterality: N/A;  . KNEE ARTHROSCOPY    . LAPAROSCOPIC GASTRIC SLEEVE RESECTION WITH HIATAL HERNIA REPAIR    . TONSILLECTOMY    . TOTAL HIP ARTHROPLASTY Left 01/26/2016    Procedure: LEFT TOTAL HIP ARTHROPLASTY ANTERIOR APPROACH;  Surgeon: Leandrew Koyanagi, MD;  Location: [place];  Service: Orthopedics;  Laterality: Left;    Current Meds  Medication Sig  . Calcium Carbonate-Vitamin D (CALTRATE 600+D PO) Take 1 tablet by mouth daily.   . cyanocobalamin (,VITAMIN B-12,) 1000 MCG/ML injection Inject 1,000 mcg into the muscle every 30 (thirty) days.  . midodrine (PROAMATINE) 10 MG tablet Take 1 tablet (10 mg total) by mouth 3 (three) times daily.  . Multiple Vitamins-Minerals (MULTIVITAMIN WITH MINERALS) tablet Take 1 tablet by mouth daily.  . pramipexole (MIRAPEX) 0.125 MG tablet Take 0.5 mg by mouth at bedtime. Pt takes 4 tablets at bedtime  . topiramate (TOPAMAX) 25 MG tablet Take 25 mg by mouth 2 (two) times daily.  . traMADol (ULTRAM) 50 MG tablet Take 1 tablet (50 mg total) by mouth every 6 (six) hours as needed.    Allergies:   Shellfish allergy; Meperidine hcl; Morphine; Bee pollen; Ivp dye [iodinated diagnostic agents]; Codeine; Oxycodone-acetaminophen; Pentazocine lactate; and Propoxyphene n-acetaminophen   Social History:  The patient  reports that she has never smoked. She has never used smokeless tobacco. She reports that she drinks alcohol. She reports that she does not use drugs.   Family History:  The patient's family history includes Breast cancer in her cousin; Diabetes in her other; Heart attack (age of onset: 15) in her brother; Heart disease in her mother; Heart failure in her mother; Hypertension in her mother; Renal Disease in her mother; Stroke in her brother; Sudden Cardiac Death in her mother; Valvular heart disease in her mother.  ROS:   Review of Systems  Constitutional: Negative for chills, diaphoresis, fever, malaise/fatigue and weight loss.  HENT: Negative for congestion.   Eyes: Negative for discharge and redness.  Respiratory: Negative for cough, hemoptysis, sputum production, shortness of breath and wheezing.   Cardiovascular: Negative  for chest pain, palpitations, orthopnea, claudication, leg swelling and PND.  Gastrointestinal: Negative for abdominal pain, blood in stool, constipation, diarrhea, heartburn, melena, nausea and vomiting.  Genitourinary: Negative for hematuria.  Musculoskeletal: Negative for falls and myalgias.  Skin: Negative for rash.  Neurological: Positive for dizziness. Negative for tingling, tremors, sensory change, speech change, focal weakness, loss of consciousness and weakness.  Endo/Heme/Allergies: Does not bruise/bleed easily.  Psychiatric/Behavioral: Negative for substance abuse. The patient is not nervous/anxious.   All other systems reviewed and are negative.    PHYSICAL EXAM:  VS:  BP 130/74 (BP Location: Left Arm, Patient Position: Sitting, Cuff Size: Large)   Pulse (!) 56   Ht 5\' 5"  (1.651 m)   Wt 267 lb (121.1 kg)   BMI 44.43 kg/m  BMI: Body mass index is 44.43 kg/m.  Physical Exam  Constitutional: She is oriented to person, place, and time. She appears well-developed and well-nourished.  HENT:  Head: Normocephalic and atraumatic.  Eyes: Right eye exhibits no discharge.  Left eye exhibits no discharge.  Neck: Normal range of motion. No JVD present.  Cardiovascular: Normal rate, regular rhythm, S1 normal, S2 normal and normal heart sounds. Exam reveals no distant heart sounds, no friction rub, no midsystolic click and no opening snap.  No murmur heard. Pulses:      Posterior tibial pulses are 2+ on the right side, and 2+ on the left side.  Pulmonary/Chest: Effort normal and breath sounds normal. No respiratory distress. She has no decreased breath sounds. She has no wheezes. She has no rales. She exhibits no tenderness.  Abdominal: Soft. She exhibits no distension. There is no tenderness.  Musculoskeletal: She exhibits no edema.  Neurological: She is alert and oriented to person, place, and time.  Skin: Skin is warm and dry. No cyanosis. Nails show no clubbing.  Psychiatric: She  has a normal mood and affect. Her speech is normal and behavior is normal. Judgment and thought content normal.     EKG:  Was ordered and interpreted by me today. Shows sinus bradycardia with sinus arrhythmia, 56 bpm, normal axis, no acute st/t changes   Recent Labs: No results found for requested labs within last 8760 hours.  No results found for requested labs within last 8760 hours.   CrCl cannot be calculated (Patient's most recent lab result is older than the maximum 21 days allowed.).   Wt Readings from Last 3 Encounters:  08/06/18 267 lb (121.1 kg)  03/11/18 267 lb 8 oz (121.3 kg)  07/25/17 260 lb (117.9 kg)     Orthostatic Vital Signs: Lying: 96/65, 61 bpm Sitting: 93/59, 66 bpm, dizzy Standing: 105/73, 73 bpm, dizzy Standing x 3 minutes: 100/67, 75 bpm  Other studies reviewed: Additional studies/records reviewed today include: summarized above  ASSESSMENT AND PLAN:  1. Pre-operative cardiac evaluation: She is doing well from a cardiac perspective and does not have any symptoms concerning for angina.  We will obtain an echocardiogram given the patient's family history of premature coronary artery disease as well as her labile hypertension with orthostatic hypotension.  Per modified Lee criteria the patient is a low risk for noncardiac surgery.  2. Labile hypertension with orthostatic hypotension: Blood pressure today is well controlled as above. Recent readings at outside offices have been well controlled as well. She is not orthostatic in the office today though subsequent blood pressure readings did reveal relative hypotension with readings in the 90s to low 737T systolic.  She will continue midodrine 10 mg 3 times daily.  We will obtain echocardiogram as above given her family history of premature coronary artery disease with her mother having an MI in her 49s as well as to further help risk stratify her given her relative hypotension.  3. Family history of heart disease:  As above.  4. Morbid obesity: Followed by the Bariatric Clinic at [facility].  Disposition: F/u with Dr. Rockey Situ or an APP in 3 months.  Current medicines are reviewed at length with the patient today.  The patient did not have any concerns regarding medicines.  Signed, Christell Faith, PA-C 08/06/2018 2:43 PM     [place] [address] [place], [place] [address] [phone]

## 2018-08-07 DIAGNOSIS — I951 Orthostatic hypotension: Secondary | ICD-10-CM | POA: Diagnosis not present

## 2018-08-07 DIAGNOSIS — G2581 Restless legs syndrome: Secondary | ICD-10-CM | POA: Diagnosis not present

## 2018-08-07 DIAGNOSIS — R0989 Other specified symptoms and signs involving the circulatory and respiratory systems: Secondary | ICD-10-CM | POA: Diagnosis not present

## 2018-08-08 ENCOUNTER — Ambulatory Visit: Payer: 59

## 2018-08-14 ENCOUNTER — Ambulatory Visit (INDEPENDENT_AMBULATORY_CARE_PROVIDER_SITE_OTHER): Payer: 59

## 2018-08-14 ENCOUNTER — Other Ambulatory Visit: Payer: Self-pay

## 2018-08-14 DIAGNOSIS — Z8249 Family history of ischemic heart disease and other diseases of the circulatory system: Secondary | ICD-10-CM | POA: Diagnosis not present

## 2018-08-14 DIAGNOSIS — Z01810 Encounter for preprocedural cardiovascular examination: Secondary | ICD-10-CM

## 2018-08-23 DIAGNOSIS — Z6841 Body Mass Index (BMI) 40.0 and over, adult: Secondary | ICD-10-CM | POA: Diagnosis not present

## 2018-08-23 DIAGNOSIS — K219 Gastro-esophageal reflux disease without esophagitis: Secondary | ICD-10-CM | POA: Diagnosis not present

## 2018-08-29 DIAGNOSIS — Z01818 Encounter for other preprocedural examination: Secondary | ICD-10-CM | POA: Diagnosis not present

## 2018-08-30 DIAGNOSIS — Z6841 Body Mass Index (BMI) 40.0 and over, adult: Secondary | ICD-10-CM | POA: Diagnosis not present

## 2018-08-30 DIAGNOSIS — K565 Intestinal adhesions [bands], unspecified as to partial versus complete obstruction: Secondary | ICD-10-CM | POA: Diagnosis not present

## 2018-08-30 DIAGNOSIS — N736 Female pelvic peritoneal adhesions (postinfective): Secondary | ICD-10-CM | POA: Diagnosis not present

## 2018-09-05 ENCOUNTER — Emergency Department
Admission: EM | Admit: 2018-09-05 | Discharge: 2018-09-05 | Disposition: A | Discharge status: Home / Self Care | Payer: 59 | Attending: Emergency Medicine | Admitting: Emergency Medicine

## 2018-09-05 ENCOUNTER — Other Ambulatory Visit: Payer: Self-pay

## 2018-09-05 ENCOUNTER — Encounter: Payer: Self-pay | Admitting: Emergency Medicine

## 2018-09-05 DIAGNOSIS — E860 Dehydration: Secondary | ICD-10-CM | POA: Diagnosis not present

## 2018-09-05 DIAGNOSIS — G8918 Other acute postprocedural pain: Secondary | ICD-10-CM | POA: Diagnosis not present

## 2018-09-05 DIAGNOSIS — R1084 Generalized abdominal pain: Secondary | ICD-10-CM | POA: Diagnosis not present

## 2018-09-05 DIAGNOSIS — Z5321 Procedure and treatment not carried out due to patient leaving prior to being seen by health care provider: Secondary | ICD-10-CM | POA: Diagnosis not present

## 2018-09-05 DIAGNOSIS — I10 Essential (primary) hypertension: Secondary | ICD-10-CM | POA: Diagnosis not present

## 2018-09-05 DIAGNOSIS — R1110 Vomiting, unspecified: Secondary | ICD-10-CM | POA: Diagnosis not present

## 2018-09-05 DIAGNOSIS — R52 Pain, unspecified: Secondary | ICD-10-CM | POA: Diagnosis not present

## 2018-09-05 DIAGNOSIS — R109 Unspecified abdominal pain: Secondary | ICD-10-CM | POA: Diagnosis not present

## 2018-09-05 LAB — COMPREHENSIVE METABOLIC PANEL
ALT: 45 U/L — ABNORMAL HIGH (ref 0–44)
AST: 45 U/L — ABNORMAL HIGH (ref 15–41)
Albumin: 4.3 g/dL (ref 3.5–5.0)
Alkaline Phosphatase: 82 U/L (ref 38–126)
Anion gap: 11 (ref 5–15)
BUN: 10 mg/dL (ref 6–20)
CO2: 23 mmol/L (ref 22–32)
Calcium: 9.3 mg/dL (ref 8.9–10.3)
Chloride: 103 mmol/L (ref 98–111)
Creatinine, Ser: 0.93 mg/dL (ref 0.44–1.00)
GFR calc Af Amer: >60 mL/min (ref >60)
GFR calc non Af Amer: >60 mL/min (ref >60)
Glucose, Bld: 88 mg/dL (ref 70–99)
Potassium: 3.2 mmol/L — ABNORMAL LOW (ref 3.5–5.1)
Sodium: 137 mmol/L (ref 135–145)
Total Bilirubin: 1.1 mg/dL (ref 0.3–1.2)
Total Protein: 8 g/dL (ref 6.5–8.1)

## 2018-09-05 LAB — URINALYSIS, COMPLETE (UACMP) WITH MICROSCOPIC
Bilirubin Urine: NEGATIVE
Glucose, UA: NEGATIVE mg/dL
Hgb urine dipstick: NEGATIVE
Ketones, ur: 20 mg/dL — AB
Leukocytes, UA: NEGATIVE
Nitrite: NEGATIVE
Protein, ur: NEGATIVE mg/dL
Specific Gravity, Urine: 1.012 (ref 1.005–1.030)
pH: 5 (ref 5.0–8.0)

## 2018-09-05 LAB — CBC
HCT: 39.7 % (ref 36.0–46.0)
Hemoglobin: 13.1 g/dL (ref 12.0–15.0)
MCH: 31.6 pg (ref 26.0–34.0)
MCHC: 33 g/dL (ref 30.0–36.0)
MCV: 95.7 fL (ref 80.0–100.0)
Platelets: 360 10*3/uL (ref 150–400)
RBC: 4.15 MIL/uL (ref 3.87–5.11)
RDW: 12.6 % (ref 11.5–15.5)
WBC: 10.7 10*3/uL — ABNORMAL HIGH (ref 4.0–10.5)
nRBC: 0 % (ref 0.0–0.2)

## 2018-09-05 LAB — LIPASE, BLOOD: Lipase: 34 U/L (ref 11–51)

## 2018-09-05 MED ORDER — ONDANSETRON HCL 4 MG/2ML IJ SOLN
4.0000 mg | Freq: Once | INTRAMUSCULAR | Status: AC
  Administered 2018-09-05: 4 mg via INTRAVENOUS
  Filled 2018-09-05: qty 2

## 2018-09-05 MED ORDER — SODIUM CHLORIDE 0.9 % IV BOLUS
1000.0000 mL | Freq: Once | INTRAVENOUS | Status: AC
  Administered 2018-09-05: 1000 mL via INTRAVENOUS

## 2018-09-05 NOTE — ED Triage Notes (Signed)
Pt here with c/o mid abd pain radiating to left abd that began around 2pm today, s/p biliopancreatic diversion with duodenal switch lap on 08/30/18. Today began having excruciating pain in her abd, appears in pain at this time.

## 2018-09-05 NOTE — ED Notes (Addendum)
Pt's son angry, states he is taking his mom to [place] where she belongs. That there is no reason for her to wait this long. IV removed per protocol. Pt wheeled to lobby via wheelchair per Jonni Sanger, pt advocate, states he heard son tell mom they were going home and will see the dr in am. Pt stayed quiet, son was the one verbalizing his frustration with the wait.

## 2018-09-05 NOTE — ED Notes (Signed)
Pt's son visibly angry, states he is an EMT for [employer] and knows his mom needs to be seen immediately, was sent here by her Dr at [place]. This RN explained that her VS were stable, she received an iv and a bag of fluid while waiting for a bed to open up, we understand she is in pain and are doing what we can to get her back. Will send NT back to [facility] to take her vitals again as soon as possible.

## 2018-09-05 NOTE — ED Notes (Addendum)
First nurse note: Colon surgery 08/30/18. Post op complications today 00/97 pain in mid abd and moved to left side. Has been following her diet, this pain is new. Appears in pain. VSS per EMS.

## 2018-09-06 DIAGNOSIS — E860 Dehydration: Secondary | ICD-10-CM | POA: Diagnosis not present

## 2018-09-06 DIAGNOSIS — G8918 Other acute postprocedural pain: Secondary | ICD-10-CM | POA: Diagnosis not present

## 2018-09-06 DIAGNOSIS — I10 Essential (primary) hypertension: Secondary | ICD-10-CM | POA: Diagnosis not present

## 2018-09-06 DIAGNOSIS — R1110 Vomiting, unspecified: Secondary | ICD-10-CM | POA: Diagnosis not present

## 2018-09-06 DIAGNOSIS — R109 Unspecified abdominal pain: Secondary | ICD-10-CM | POA: Diagnosis not present

## 2018-09-06 MED ORDER — PIPERACILLIN-TAZOBACTAM 3.375 G IVPB
3.38 | INTRAVENOUS | Status: DC
Start: 2018-09-06 — End: 2018-09-06

## 2018-09-06 MED ORDER — HYDRALAZINE HCL 20 MG/ML IJ SOLN
10.00 | INTRAMUSCULAR | Status: DC
Start: ? — End: 2018-09-06

## 2018-09-06 MED ORDER — ALBUTEROL SULFATE (2.5 MG/3ML) 0.083% IN NEBU
2.50 | INHALATION_SOLUTION | RESPIRATORY_TRACT | Status: DC
Start: ? — End: 2018-09-06

## 2018-09-06 MED ORDER — KCL IN DEXTROSE-NACL 20-5-0.45 MEQ/L-%-% IV SOLN
125.00 | INTRAVENOUS | Status: DC
Start: ? — End: 2018-09-06

## 2018-09-06 MED ORDER — ONDANSETRON HCL 4 MG/2ML IJ SOLN
4.00 | INTRAMUSCULAR | Status: DC
Start: ? — End: 2018-09-06

## 2018-09-06 MED ORDER — GENERIC EXTERNAL MEDICATION
12.50 | Status: DC
Start: ? — End: 2018-09-06

## 2018-09-06 MED ORDER — TOPIRAMATE 25 MG PO TABS
50.00 | ORAL_TABLET | ORAL | Status: DC
Start: 2018-09-06 — End: 2018-09-06

## 2018-09-06 MED ORDER — ENOXAPARIN SODIUM 40 MG/0.4ML ~~LOC~~ SOLN
40.00 | SUBCUTANEOUS | Status: DC
Start: 2018-09-07 — End: 2018-09-06

## 2018-09-06 MED ORDER — HYDROMORPHONE HCL 1 MG/ML IJ SOLN
1.00 | INTRAMUSCULAR | Status: DC
Start: ? — End: 2018-09-06

## 2018-09-06 MED ORDER — PANTOPRAZOLE SODIUM 40 MG IV SOLR
40.00 | INTRAVENOUS | Status: DC
Start: 2018-09-07 — End: 2018-09-06

## 2018-09-06 MED ORDER — ACETAMINOPHEN 10 MG/ML IV SOLN
1000.00 | INTRAVENOUS | Status: DC
Start: ? — End: 2018-09-06

## 2018-09-06 MED ORDER — DIPHENHYDRAMINE HCL 50 MG/ML IJ SOLN
25.00 | INTRAMUSCULAR | Status: DC
Start: ? — End: 2018-09-06

## 2018-09-07 DIAGNOSIS — E860 Dehydration: Secondary | ICD-10-CM | POA: Diagnosis not present

## 2018-09-07 DIAGNOSIS — I10 Essential (primary) hypertension: Secondary | ICD-10-CM | POA: Diagnosis not present

## 2018-09-07 DIAGNOSIS — G8918 Other acute postprocedural pain: Secondary | ICD-10-CM | POA: Diagnosis not present

## 2018-09-08 DIAGNOSIS — G8918 Other acute postprocedural pain: Secondary | ICD-10-CM | POA: Diagnosis not present

## 2018-09-08 DIAGNOSIS — I10 Essential (primary) hypertension: Secondary | ICD-10-CM | POA: Diagnosis not present

## 2018-09-08 DIAGNOSIS — E860 Dehydration: Secondary | ICD-10-CM | POA: Diagnosis not present

## 2018-09-12 ENCOUNTER — Telehealth (INDEPENDENT_AMBULATORY_CARE_PROVIDER_SITE_OTHER): Payer: Self-pay | Admitting: Orthopaedic Surgery

## 2018-09-12 NOTE — Telephone Encounter (Signed)
Ok 1 year

## 2018-09-12 NOTE — Telephone Encounter (Signed)
Patient called needing her handicap placard renewed. The number to contact patient is [phone]

## 2018-09-12 NOTE — Telephone Encounter (Signed)
Please advise. If so, how many months?

## 2018-09-13 NOTE — Telephone Encounter (Signed)
WILL MAIL IT OUT TO HER. STATES SHE HAD A MAJOR SU AND CANNOT COME

## 2018-09-17 DIAGNOSIS — Z9884 Bariatric surgery status: Secondary | ICD-10-CM | POA: Diagnosis not present

## 2018-09-17 DIAGNOSIS — Z008 Encounter for other general examination: Secondary | ICD-10-CM | POA: Diagnosis not present

## 2018-09-17 DIAGNOSIS — Z713 Dietary counseling and surveillance: Secondary | ICD-10-CM | POA: Diagnosis not present

## 2018-09-18 ENCOUNTER — Encounter (HOSPITAL_COMMUNITY): Payer: Self-pay | Admitting: Emergency Medicine

## 2018-09-18 ENCOUNTER — Other Ambulatory Visit: Payer: Self-pay

## 2018-09-18 ENCOUNTER — Emergency Department (HOSPITAL_COMMUNITY)
Admission: EM | Admit: 2018-09-18 | Discharge: 2018-09-19 | Disposition: A | Discharge status: Other Acute Inpatient Hospital | Payer: 59 | Attending: Emergency Medicine | Admitting: Emergency Medicine

## 2018-09-18 DIAGNOSIS — Z96642 Presence of left artificial hip joint: Secondary | ICD-10-CM | POA: Insufficient documentation

## 2018-09-18 DIAGNOSIS — I10 Essential (primary) hypertension: Secondary | ICD-10-CM | POA: Diagnosis not present

## 2018-09-18 DIAGNOSIS — Z79899 Other long term (current) drug therapy: Secondary | ICD-10-CM | POA: Diagnosis not present

## 2018-09-18 DIAGNOSIS — Z9889 Other specified postprocedural states: Secondary | ICD-10-CM | POA: Insufficient documentation

## 2018-09-18 DIAGNOSIS — J45909 Unspecified asthma, uncomplicated: Secondary | ICD-10-CM | POA: Insufficient documentation

## 2018-09-18 DIAGNOSIS — R112 Nausea with vomiting, unspecified: Secondary | ICD-10-CM | POA: Insufficient documentation

## 2018-09-18 DIAGNOSIS — R1010 Upper abdominal pain, unspecified: Secondary | ICD-10-CM | POA: Diagnosis present

## 2018-09-18 DIAGNOSIS — D069 Carcinoma in situ of cervix, unspecified: Secondary | ICD-10-CM | POA: Diagnosis not present

## 2018-09-18 DIAGNOSIS — R1084 Generalized abdominal pain: Secondary | ICD-10-CM | POA: Diagnosis not present

## 2018-09-18 LAB — LIPASE, BLOOD: Lipase: 64 U/L — ABNORMAL HIGH (ref 11–51)

## 2018-09-18 LAB — CBC
HCT: 42.9 % (ref 36.0–46.0)
Hemoglobin: 14 g/dL (ref 12.0–15.0)
MCH: 30.8 pg (ref 26.0–34.0)
MCHC: 32.6 g/dL (ref 30.0–36.0)
MCV: 94.3 fL (ref 80.0–100.0)
Platelets: 571 10*3/uL — ABNORMAL HIGH (ref 150–400)
RBC: 4.55 MIL/uL (ref 3.87–5.11)
RDW: 12.2 % (ref 11.5–15.5)
WBC: 8.9 10*3/uL (ref 4.0–10.5)
nRBC: 0 % (ref 0.0–0.2)

## 2018-09-18 LAB — URINALYSIS, ROUTINE W REFLEX MICROSCOPIC
Bilirubin Urine: NEGATIVE
Glucose, UA: NEGATIVE mg/dL
Hgb urine dipstick: NEGATIVE
Ketones, ur: NEGATIVE mg/dL
Leukocytes, UA: NEGATIVE
Nitrite: NEGATIVE
Protein, ur: NEGATIVE mg/dL
Specific Gravity, Urine: 1.014 (ref 1.005–1.030)
pH: 5 (ref 5.0–8.0)

## 2018-09-18 LAB — COMPREHENSIVE METABOLIC PANEL
ALT: 59 U/L — ABNORMAL HIGH (ref 0–44)
AST: 71 U/L — ABNORMAL HIGH (ref 15–41)
Albumin: 4.1 g/dL (ref 3.5–5.0)
Alkaline Phosphatase: 74 U/L (ref 38–126)
Anion gap: 14 (ref 5–15)
BUN: 11 mg/dL (ref 6–20)
CO2: 20 mmol/L — ABNORMAL LOW (ref 22–32)
Calcium: 9.9 mg/dL (ref 8.9–10.3)
Chloride: 102 mmol/L (ref 98–111)
Creatinine, Ser: 1.42 mg/dL — ABNORMAL HIGH (ref 0.44–1.00)
GFR calc Af Amer: 50 mL/min — ABNORMAL LOW (ref >60)
GFR calc non Af Amer: 44 mL/min — ABNORMAL LOW (ref >60)
Glucose, Bld: 122 mg/dL — ABNORMAL HIGH (ref 70–99)
Potassium: 3.8 mmol/L (ref 3.5–5.1)
Sodium: 136 mmol/L (ref 135–145)
Total Bilirubin: 0.7 mg/dL (ref 0.3–1.2)
Total Protein: 8.5 g/dL — ABNORMAL HIGH (ref 6.5–8.1)

## 2018-09-18 LAB — I-STAT BETA HCG BLOOD, ED (MC, WL, AP ONLY): I-stat hCG, quantitative: <5 m[IU]/mL (ref <5)

## 2018-09-18 MED ORDER — ONDANSETRON 4 MG PO TBDP
4.0000 mg | ORAL_TABLET | Freq: Once | ORAL | Status: AC | PRN
  Administered 2018-09-18: 4 mg via ORAL
  Filled 2018-09-18: qty 1

## 2018-09-18 MED ORDER — SODIUM CHLORIDE 0.9% FLUSH
3.0000 mL | Freq: Once | INTRAVENOUS | Status: AC
  Administered 2018-09-19: 3 mL via INTRAVENOUS

## 2018-09-18 NOTE — ED Triage Notes (Signed)
Pt c/o abdominal pain, nausea/vomiting that started today. Reports biliopancreatic diversion with duodenal switch on 08/30/18. LBM this morning.

## 2018-09-19 ENCOUNTER — Emergency Department (HOSPITAL_COMMUNITY): Payer: 59

## 2018-09-19 DIAGNOSIS — R1110 Vomiting, unspecified: Secondary | ICD-10-CM | POA: Diagnosis not present

## 2018-09-19 DIAGNOSIS — R609 Edema, unspecified: Secondary | ICD-10-CM | POA: Diagnosis not present

## 2018-09-19 DIAGNOSIS — R109 Unspecified abdominal pain: Secondary | ICD-10-CM | POA: Diagnosis not present

## 2018-09-19 DIAGNOSIS — R112 Nausea with vomiting, unspecified: Secondary | ICD-10-CM | POA: Diagnosis not present

## 2018-09-19 DIAGNOSIS — R51 Headache: Secondary | ICD-10-CM | POA: Diagnosis not present

## 2018-09-19 LAB — I-STAT CG4 LACTIC ACID, ED: Lactic Acid, Venous: 1.43 mmol/L (ref 0.5–1.9)

## 2018-09-19 MED ORDER — FENTANYL CITRATE (PF) 100 MCG/2ML IJ SOLN
50.0000 ug | INTRAMUSCULAR | Status: DC | PRN
  Administered 2018-09-19 (×2): 50 ug via INTRAVENOUS
  Filled 2018-09-19 (×2): qty 2

## 2018-09-19 MED ORDER — FENTANYL CITRATE (PF) 100 MCG/2ML IJ SOLN
50.0000 ug | Freq: Once | INTRAMUSCULAR | Status: AC
  Administered 2018-09-19: 50 ug via INTRAVENOUS
  Filled 2018-09-19: qty 2

## 2018-09-19 MED ORDER — SODIUM CHLORIDE 0.9 % IV BOLUS
1000.0000 mL | Freq: Once | INTRAVENOUS | Status: AC
  Administered 2018-09-19: 1000 mL via INTRAVENOUS

## 2018-09-19 MED ORDER — ONDANSETRON HCL 4 MG/2ML IJ SOLN
4.0000 mg | Freq: Four times a day (QID) | INTRAMUSCULAR | Status: DC | PRN
  Administered 2018-09-19: 4 mg via INTRAVENOUS
  Filled 2018-09-19: qty 2

## 2018-09-19 MED ORDER — METHYLPREDNISOLONE SODIUM SUCC 40 MG IJ SOLR
40.0000 mg | Freq: Four times a day (QID) | INTRAMUSCULAR | Status: DC
  Administered 2018-09-19 (×2): 40 mg via INTRAVENOUS
  Filled 2018-09-19 (×2): qty 1

## 2018-09-19 MED ORDER — SODIUM CHLORIDE 0.9 % IV SOLN
Freq: Once | INTRAVENOUS | Status: AC
  Administered 2018-09-19: 1000 mL via INTRAVENOUS

## 2018-09-19 MED ORDER — ONDANSETRON HCL 4 MG/2ML IJ SOLN
4.0000 mg | Freq: Once | INTRAMUSCULAR | Status: AC
  Administered 2018-09-19: 4 mg via INTRAVENOUS
  Filled 2018-09-19: qty 2

## 2018-09-19 MED ORDER — TOPIRAMATE 25 MG PO TABS
25.0000 mg | ORAL_TABLET | Freq: Two times a day (BID) | ORAL | Status: DC
  Administered 2018-09-19: 25 mg via ORAL
  Filled 2018-09-19: qty 1

## 2018-09-19 NOTE — ED Provider Notes (Signed)
[facility] EMERGENCY DEPARTMENT Provider Note   CSN: [id number] Arrival date & time: 09/18/18  2035     History   Chief Complaint Chief Complaint  Patient presents with  . Abdominal Pain    HPI Vanessa Medina is a 48 y.o. female.  Patient presents with diffuse abdominal pain with nausea and vomiting.  She reports cramping upper abdominal pain that radiates to her back and diffusely in her abdomen is been coming and going all day.  Associated with episodes of nonbloody nonbilious emesis x7 or 8 times.  Patient underwent biliopancreatic diversion with duodenal switch on December 27 by Dr. Darnell Level at [facility] in [place].  She states she was hospitalized after this with pneumonia.  The pain she is having now is dissimilar to what she is had in the past.  She is been taking hydrocodone at home with partial relief.  Has not been able to eat today at all.  Was recently switched from liquid diet to soft diet.  She reports diffuse cramping in her abdomen that radiates to her back that comes and goes with persistent nausea.  Last bowel movement was this morning.  No fever, dysuria or hematuria.  No chest pain or shortness of breath.  She did not call her surgeon.   The history is provided by the patient.    Past Medical History:  Diagnosis Date  . Anemia   . Cervical cancer ([place])   . Family history of adverse reaction to anesthesia     " my mother takes a long time time wake up"  . Headache    migraine  . History of shingles   . Hypertension   . Migraine   . Multiple allergies   . Obesity   . Osteoarthritis    left hip  . PONV (postoperative nausea and vomiting)   . Sleep apnea    does not wear CPAP  . Wears glasses     Patient Active Problem List   Diagnosis Date Noted  . Labile blood pressure 03/08/2018  . Chronic low back pain 09/03/2017  . History of total hip replacement, left 01/26/2016  . EDEMA 04/28/2008  . LEG PAIN, BILATERAL 12/16/2007  . CERVICAL  CANCER 07/02/2007  . MORBID OBESITY 07/02/2007  . DEPRESSION 07/02/2007  . COMMON MIGRAINE 07/02/2007  . ALLERGIC RHINITIS 07/02/2007  . ASTHMA 07/02/2007  . GERD 07/02/2007  . ELEVATED BLOOD PRESSURE WITHOUT DIAGNOSIS OF HYPERTENSION 07/02/2007    Past Surgical History:  Procedure Laterality Date  . ABDOMINAL HYSTERECTOMY    . APPENDECTOMY    . CHOLECYSTECTOMY    . DIAGNOSTIC LAPAROSCOPY     LOA  . ESOPHAGOGASTRODUODENOSCOPY (EGD) WITH PROPOFOL N/A 07/25/2017   Procedure: ESOPHAGOGASTRODUODENOSCOPY (EGD) WITH PROPOFOL;  Surgeon: Manya Silvas, MD;  Location[facility] ENDOSCOPY;  Service: Endoscopy;  Laterality: N/A;  . KNEE ARTHROSCOPY    . LAPAROSCOPIC GASTRIC SLEEVE RESECTION WITH HIATAL HERNIA REPAIR    . TONSILLECTOMY    . TOTAL HIP ARTHROPLASTY Left 01/26/2016   Procedure: LEFT TOTAL HIP ARTHROPLASTY ANTERIOR APPROACH;  Surgeon: Leandrew Koyanagi, MD;  Location: [place];  Service: Orthopedics;  Laterality: Left;     OB History   No obstetric history on file.      Home Medications    Prior to Admission medications   Medication Sig Start Date End Date Taking? Authorizing Provider  Calcium Carbonate-Vitamin D (CALTRATE 600+D PO) Take 1 tablet by mouth daily.     [provider]  cyanocobalamin (,VITAMIN B-12,) 1000 MCG/ML injection Inject 1,000 mcg into the muscle every 30 (thirty) days.    [provider]  midodrine (PROAMATINE) 10 MG tablet Take 1 tablet (10 mg total) by mouth 3 (three) times daily. 03/11/18   Minna Merritts, MD  Multiple Vitamins-Minerals (MULTIVITAMIN WITH MINERALS) tablet Take 1 tablet by mouth daily.    [provider]  pramipexole (MIRAPEX) 0.125 MG tablet Take 0.5 mg by mouth at bedtime. Pt takes 4 tablets at bedtime    [provider]  topiramate (TOPAMAX) 25 MG tablet Take 25 mg by mouth 2 (two) times daily.    [provider]  traMADol (ULTRAM) 50 MG tablet Take 1 tablet (50 mg total) by mouth every 6 (six)  hours as needed. 09/03/17   Aundra Dubin, PA-C    Family History Family History  Problem Relation Age of Onset  . Renal Disease Mother   . Hypertension Mother   . Sudden Cardiac Death Mother   . Heart failure Mother   . Valvular heart disease Mother   . Heart disease Mother   . Stroke Brother   . Heart attack Brother 71  . Diabetes Other   . Breast cancer Cousin        paternal side    Social History Social History   Tobacco Use  . Smoking status: Never Smoker  . Smokeless tobacco: Never Used  Substance Use Topics  . Alcohol use: Yes    Comment: occasional wine  . Drug use: No     Allergies   Shellfish allergy; Meperidine hcl; Morphine; Bee pollen; Ivp dye [iodinated diagnostic agents]; Codeine; Oxycodone-acetaminophen; Pentazocine lactate; and Propoxyphene n-acetaminophen   Review of Systems Review of Systems  Constitutional: Positive for activity change and appetite change. Negative for fever.  HENT: Negative for congestion and postnasal drip.   Respiratory: Negative for cough, chest tightness and shortness of breath.   Cardiovascular: Negative for chest pain.  Gastrointestinal: Positive for abdominal pain, nausea and vomiting.  Genitourinary: Negative for dysuria and hematuria.  Musculoskeletal: Positive for back pain.  Skin: Negative for rash.  Neurological: Negative for dizziness, weakness and headaches.   all other systems are negative except as noted in the HPI and PMH.    Physical Exam Updated Vital Signs BP (!) 112/59 (BP Location: Right Arm)   Pulse 94   Temp (!) 97.5 F (36.4 C) (Oral)   Resp 16   SpO2 95%   Physical Exam Vitals signs and nursing note reviewed.  Constitutional:      General: She is not in acute distress.    Appearance: She is well-developed. She is obese.     Comments: Patient appears uncomfortable  HENT:     Head: Normocephalic and atraumatic.     Mouth/Throat:     Pharynx: No oropharyngeal exudate.  Eyes:      Conjunctiva/sclera: Conjunctivae normal.     Pupils: Pupils are equal, round, and reactive to light.  Neck:     Musculoskeletal: Normal range of motion and neck supple.     Comments: No meningismus. Cardiovascular:     Rate and Rhythm: Normal rate and regular rhythm.     Heart sounds: Normal heart sounds. No murmur.  Pulmonary:     Effort: Pulmonary effort is normal. No respiratory distress.     Breath sounds: Normal breath sounds.  Abdominal:     Palpations: Abdomen is soft.     Tenderness: There is abdominal tenderness. There is no  rebound.     Comments: Obese abdomen, diffusely tender with voluntary guarding throughout, worse in the epigastrium.  Laparoscopic incisions appear well-healed  Musculoskeletal: Normal range of motion.        General: No tenderness.     Comments: Paraspinal thoracic tenderness bilaterally  Skin:    General: Skin is warm.  Neurological:     Mental Status: She is alert and oriented to person, place, and time.     Cranial Nerves: No cranial nerve deficit.     Motor: No abnormal muscle tone.     Coordination: Coordination normal.     Comments: No ataxia on finger to nose bilaterally. No pronator drift. 5/5 strength throughout. CN 2-12 intact.Equal grip strength. Sensation intact.   Psychiatric:        Behavior: Behavior normal.      ED Treatments / Results  Labs (all labs ordered are listed, but only abnormal results are displayed) Labs Reviewed  LIPASE, BLOOD - Abnormal; Notable for the following components:      Result Value   Lipase 64 (*)    All other components within normal limits  COMPREHENSIVE METABOLIC PANEL - Abnormal; Notable for the following components:   CO2 20 (*)    Glucose, Bld 122 (*)    Creatinine, Ser 1.42 (*)    Total Protein 8.5 (*)    AST 71 (*)    ALT 59 (*)    GFR calc non Af Amer 44 (*)    GFR calc Af Amer 50 (*)    All other components within normal limits  CBC - Abnormal; Notable for the following components:    Platelets 571 (*)    All other components within normal limits  URINALYSIS, ROUTINE W REFLEX MICROSCOPIC - Abnormal; Notable for the following components:   APPearance HAZY (*)    All other components within normal limits  I-STAT BETA HCG BLOOD, ED ([facility], [facility], [facility] ONLY)  I-STAT CG4 LACTIC ACID, ED    EKG None  Radiology Ct Abdomen Pelvis Wo Contrast  Result Date: 09/19/2018 CLINICAL DATA:  Abdominal pain, nausea/vomiting. Biliopancreatic diversion with duodenal switch on August 30, 2018. History of cervical cancer. Prior cholecystectomy, appendectomy, and hysterectomy. EXAM: CT ABDOMEN AND PELVIS WITHOUT CONTRAST TECHNIQUE: Multidetector CT imaging of the abdomen and pelvis was performed following the standard protocol without IV contrast. COMPARISON:  None. FINDINGS: Lower chest: Lung bases are clear. Hepatobiliary: Mild hepatic steatosis. Status post cholecystectomy. No intrahepatic or extrahepatic ductal dilatation. Pancreas: Within normal limits. Spleen: Within normal limits. Adrenals/Urinary Tract: Adrenal glands are within normal limits. Kidneys are within normal limits. No renal calculi or hydronephrosis. Bladder is within normal limits. Stomach/Bowel: Status post sleeve gastrectomy with duodenal switch procedure. Mild wall thickening/inflammatory changes involving the distal stomach just proximal to the gastroenteric anastomosis (series 3/image 23). Wall thickening involving a mildly prominent loop of small bowel in the right mid abdomen (series 3/image 36), suggesting infectious/inflammatory enteritis. Additional wall thickening a with inflammatory changes involving small bowel in the right lower quadrant (series 3/image 71). No evidence of bowel obstruction. Prior appendectomy. Colon is not decompressed. Wall thickening involving Vascular/Lymphatic: No evidence of abdominal aortic aneurysm. No suspicious abdominopelvic lymphadenopathy. Reproductive: Status post hysterectomy. Left ovary is  notable for a 3.4 cm cyst/follicle, possibly hemorrhagic. No right adnexal mass. Other: Small volume pelvic ascites. Musculoskeletal: Mild degenerative changes of the visualized thoracolumbar spine. IMPRESSION: Status post sleeve gastrectomy with duodenal switch procedure. Mild wall thickening/inflammatory changes involving distal stomach just proximal to  the gastroenteric anastomosis. Wall thickening/inflammatory changes involving loops of small bowel in the right mid abdomen. These findings suggest infectious/inflammatory gastroenteritis. No evidence of bowel obstruction. Prior cholecystectomy, appendectomy, and hysterectomy. Additional ancillary findings as above. Electronically Signed   By: Julian Hy M.D.   On: 09/19/2018 03:34   Dg Abdomen Acute W/chest  Result Date: 09/19/2018 CLINICAL DATA:  Postop vomiting EXAM: DG ABDOMEN ACUTE W/ 1V CHEST COMPARISON:  None. FINDINGS: Lungs are clear.  No pleural effusion or pneumothorax. The heart is normal in size. Mildly prominent but nondilated loops of small bowel in the central abdomen. Left colon is with normal limits and gas-filled, suggesting a nonobstructive bowel gas pattern. Surgical clips in the left lower abdomen.  Cholecystectomy clips. No evidence of free air under the diaphragm on the upright view. Mild degenerative changes the lower lumbar spine. Status post left hip arthroplasty. IMPRESSION: No evidence of acute cardiopulmonary disease. No evidence of small bowel obstruction or free air. Electronically Signed   By: Julian Hy M.D.   On: 09/19/2018 00:52    Procedures Procedures (including critical care time)  Medications Ordered in ED Medications  sodium chloride 0.9 % bolus 1,000 mL (has no administration in time range)  ondansetron (ZOFRAN) injection 4 mg (has no administration in time range)  fentaNYL (SUBLIMAZE) injection 50 mcg (has no administration in time range)  sodium chloride flush (NS) 0.9 % injection 3 mL (3 mLs  Intravenous Given 09/19/18 0015)  ondansetron (ZOFRAN-ODT) disintegrating tablet 4 mg (4 mg Oral Given 09/18/18 2108)     Initial Impression / Assessment and Plan / ED Course  I have reviewed the triage vital signs and the nursing notes.  Pertinent labs & imaging results that were available during my care of the patient were reviewed by me and considered in my medical decision making (see chart for details).    Postoperative abdominal pain with nausea and vomiting.  Patient uncomfortable and abdomen soft but diffusely tender with guarding.  Concern for possible bowel obstruction.  Will hydrate and treat symptoms. Labs show AKI and mildly elevated lipase.   Patient given IV fluids.  CT scan obtained without contrast given her allergy.  There is no evidence of bowel obstruction but does show thickening near her anastomosis with thickened bowel loops suggestive of inflammation or infection.  Patient continues to have severe nausea and pain. [facility].  Discussed with PA Link Snuffer who is covering for Dr. Darnell Level.  He agrees [facility] should [facility] to be [facility] for observation and pain and nausea control. Does not recommend antibiotics or NG tube at this point.  [facility] at [facility]  Lynann Bologna recommends starting Solu-Medrol 40 mg every 6 hours, continue hydration and keep patient n.p.o.  Patient resting comfortably.  She remains n.p.o.  We will continue IV fluids and symptom control while awaiting bed [facility].  Final Clinical Impressions(s) / ED Diagnoses   Final diagnoses:  Postoperative nausea and vomiting    ED Discharge Orders    None       Dyanara Cozza, Annie Main, MD 09/19/18 [phone]

## 2018-09-19 NOTE — ED Notes (Signed)
Patient transported to X-ray 

## 2018-09-20 DIAGNOSIS — R51 Headache: Secondary | ICD-10-CM | POA: Diagnosis not present

## 2018-09-20 DIAGNOSIS — K219 Gastro-esophageal reflux disease without esophagitis: Secondary | ICD-10-CM | POA: Diagnosis not present

## 2018-09-20 DIAGNOSIS — Z6841 Body Mass Index (BMI) 40.0 and over, adult: Secondary | ICD-10-CM | POA: Diagnosis not present

## 2018-09-20 DIAGNOSIS — R609 Edema, unspecified: Secondary | ICD-10-CM | POA: Diagnosis not present

## 2018-09-20 DIAGNOSIS — R112 Nausea with vomiting, unspecified: Secondary | ICD-10-CM | POA: Diagnosis not present

## 2018-09-20 DIAGNOSIS — R1110 Vomiting, unspecified: Secondary | ICD-10-CM | POA: Diagnosis not present

## 2018-09-20 DIAGNOSIS — Z9884 Bariatric surgery status: Secondary | ICD-10-CM | POA: Diagnosis not present

## 2018-09-23 ENCOUNTER — Ambulatory Visit: Payer: 59 | Admitting: Cardiovascular Disease

## 2018-09-23 ENCOUNTER — Encounter: Payer: Self-pay | Admitting: Cardiovascular Disease

## 2018-09-23 ENCOUNTER — Telehealth: Payer: Self-pay | Admitting: Cardiovascular Disease

## 2018-09-23 VITALS — BP 100/62 | HR 63 | Ht 65.0 in | Wt 247.5 lb

## 2018-09-23 DIAGNOSIS — R600 Localized edema: Secondary | ICD-10-CM

## 2018-09-23 DIAGNOSIS — R0989 Other specified symptoms and signs involving the circulatory and respiratory systems: Secondary | ICD-10-CM

## 2018-09-23 DIAGNOSIS — R0789 Other chest pain: Secondary | ICD-10-CM | POA: Diagnosis not present

## 2018-09-23 DIAGNOSIS — R001 Bradycardia, unspecified: Secondary | ICD-10-CM | POA: Diagnosis not present

## 2018-09-23 NOTE — Progress Notes (Signed)
Cardiology Office Note  Date:  09/23/2018   ID:  [name], [name] 1971/07/18, MRN [id number]  PCP:  [place] Pink, MD   Chief Complaint  Patient presents with  . other    Follow up from [facility]; decreased heart rates. Meds reviewed by the pt. verbally. Pt. c/o chest tightness several days after the discharge but feels very tired.      HPI:  Vanessa Medina is a 48 y.o. Female Nonsmoker No diabetes With history of hypertension, Morbid obesity History of gastric sleeve 3 years ago Who presents for follow-up of her labile blood pressure, hypotension and orthostasis  In follow-up she reports that she has had a very difficult couple of months  major surgery in December 2019 Told she had significant adhesions Several trips back to the hospital for various reasons including dehydration During her hospital visit September 05, 2018 and September 18, 2018 she reported heart rate was running low, medications were held  Since medications held heart rate typically running 50 up to 60, blood pressure stable but very occasionally with low heart rate.  One episode down to 40 in the setting of upper epigastric pain  She has some tightness.  Discomfort in the xiphoid area rating through to her back  She does also report having occasional palpitations  Cardiac studies reviewed with her in detail Echocardiogram August 14, 2018 - Left ventricle: The cavity size was normal. Wall thickness was   increased in a pattern of mild LVH. Systolic function was normal.   The estimated ejection fraction was in the range of 55% to 60%.   Wall motion was normal; there were no regional wall motion   abnormalities. Left ventricular diastolic function parameters   were normal. - Right ventricle: The cavity size was normal. Wall thickness was   normal. Systolic function was normal.  Previously was taking midodrine for blood pressure support  EKG personally reviewed by myself on todays  visit Shows normal sinus rhythm with rate 62 bpm no significant ST or T-wave changes  PMH:   has a past medical history of Anemia, Cervical cancer ([place]), Family history of adverse reaction to anesthesia, Headache, History of shingles, Hypertension, Migraine, Multiple allergies, Obesity, Osteoarthritis, PONV (postoperative nausea and vomiting), Sleep apnea, and Wears glasses.  PSH:    Past Surgical History:  Procedure Laterality Date  . ABDOMINAL HYSTERECTOMY    . APPENDECTOMY    . BILIOPANCREATIC DIVERSION     with duodenal switch laparoscopic   . CHOLECYSTECTOMY    . DIAGNOSTIC LAPAROSCOPY     LOA  . ESOPHAGOGASTRODUODENOSCOPY (EGD) WITH PROPOFOL N/A 07/25/2017   Procedure: ESOPHAGOGASTRODUODENOSCOPY (EGD) WITH PROPOFOL;  Surgeon: Manya Silvas, MD;  Location: [facility] ENDOSCOPY;  Service: Endoscopy;  Laterality: N/A;  . KNEE ARTHROSCOPY    . LAPAROSCOPIC GASTRIC SLEEVE RESECTION WITH HIATAL HERNIA REPAIR    . TONSILLECTOMY    . TOTAL HIP ARTHROPLASTY Left 01/26/2016   Procedure: LEFT TOTAL HIP ARTHROPLASTY ANTERIOR APPROACH;  Surgeon: Leandrew Koyanagi, MD;  Location: [place];  Service: Orthopedics;  Laterality: Left;    Current Outpatient Medications  Medication Sig Dispense Refill  . Calcium Carbonate-Vitamin D (CALTRATE 600+D PO) Take 1 tablet by mouth daily.     . cyanocobalamin (,VITAMIN B-12,) 1000 MCG/ML injection Inject 1,000 mcg into the muscle every 30 (thirty) days.    . Multiple Vitamins-Minerals (MULTIVITAMIN WITH MINERALS) tablet Take 1 tablet by mouth daily.    . ondansetron (ZOFRAN-ODT) 4 MG disintegrating tablet  Take 4 mg by mouth every 8 (eight) hours as needed for nausea or vomiting.     . pantoprazole (PROTONIX) 40 MG tablet Take 40 mg by mouth daily.    . pramipexole (MIRAPEX) 0.125 MG tablet Take 0.5 mg by mouth at bedtime. Pt takes 4 tablets at bedtime    . topiramate (TOPAMAX) 25 MG tablet Take 25 mg by mouth 2 (two) times daily.    Marland Kitchen HYDROcodone-acetaminophen  (HYCET) 7.5-325 mg/15 ml solution Take 15 mLs by mouth every 4 (four) hours as needed for moderate pain.      No current facility-administered medications for this visit.      Allergies:   Shellfish allergy; Meperidine hcl; Morphine; Bee pollen; Ivp dye [iodinated diagnostic agents]; Codeine; Oxycodone-acetaminophen; Pentazocine lactate; and Propoxyphene n-acetaminophen   Social History:  The patient  reports that she has never smoked. She has never used smokeless tobacco. She reports current alcohol use. She reports that she does not use drugs.   Family History:   family history includes Breast cancer in her cousin; Diabetes in an other family member; Heart attack (age of onset: 90) in her brother; Heart disease in her mother; Heart failure in her mother; Hypertension in her mother; Renal Disease in her mother; Stroke in her brother; Sudden Cardiac Death in her mother; Valvular heart disease in her mother.    Review of Systems: Review of Systems  Constitutional: Negative.   Respiratory: Negative.   Cardiovascular: Positive for palpitations.  Gastrointestinal:       Upper epigastric and subxiphoid pain  Musculoskeletal: Negative.   Psychiatric/Behavioral: Negative.   All other systems reviewed and are negative.    PHYSICAL EXAM: VS:  BP 100/62 (BP Location: Left Arm, Patient Position: Sitting, Cuff Size: Large)   Pulse 63   Ht 5\' 5"  (1.651 m)   Wt 247 lb 8 oz (112.3 kg)   BMI 41.19 kg/m  , BMI Body mass index is 41.19 kg/m. Constitutional:  oriented to person, place, and time. No distress.  HENT:  Head: Grossly normal Eyes:  no discharge. No scleral icterus.  Neck: No JVD, no carotid bruits  Cardiovascular: Regular rate and rhythm, no murmurs appreciated Pulmonary/Chest: Clear to auscultation bilaterally, no wheezes or rails Abdominal: Soft.  no distension.  no tenderness.  Musculoskeletal: Normal range of motion Neurological:  normal muscle tone. Coordination normal. No  atrophy Skin: Skin warm and dry Psychiatric: normal affect, pleasant  Recent Labs: 09/18/2018: ALT 59; BUN 11; Creatinine, Ser 1.42; Hemoglobin 14.0; Platelets 571; Potassium 3.8; Sodium 136    Lipid Panel No results found for: CHOL, HDL, LDLCALC, TRIG    Wt Readings from Last 3 Encounters:  09/23/18 247 lb 8 oz (112.3 kg)  09/05/18 263 lb (119.3 kg)  08/06/18 267 lb (121.1 kg)      ASSESSMENT AND PLAN:  Orthostatic hypotension - Plan: EKG 12-Lead She does report recent 30 to 40 pound weight loss from recent surgeries Blood pressure recordings at home are stable 354 up to 656 systolic We will not restart midodrine Strongly recommend she continue high fluid intake, not avoid salt  Chest discomfort Pointing to her subxiphoid area, likely postsurgical Very few risk factors for coronary disease Nonsmoker, no diabetes. Low risk of coronary disease Normal EKG No further workup at this time  Gastric sleeve/bypass surgery Recent major surgery, reports that she is still recovering  Palpitations Likely having APCs or PVCs Recent low potassium may be contributing She does not want beta-blockers at this time  for symptom control  Disposition:   F/U one year    Total encounter time more than 25 minutes  Greater than 50% was spent in counseling and coordination of care with the patient     Orders Placed This Encounter  Procedures  . EKG 12-Lead     Signed, Esmond Plants, M.D., Ph.D. 09/23/2018  [place], [place]

## 2018-09-23 NOTE — Telephone Encounter (Signed)
STAT if HR is under 50 or over 120 (normal HR is 60-100 beats per minute)  1) What is your heart rate? hasn't checked today   2) Do you have a log of your heart rate readings (document readings)? Per patient 28-40 when admitted 40-60 yesterday   3) Do you have any other symptoms?  Tight chest

## 2018-09-23 NOTE — Patient Instructions (Signed)
Call if heart rate runs low, Zio monitor could be ordered   Medication Instructions:  No changes  If you need a refill on your cardiac medications before your next appointment, please call your pharmacy.    Lab work: No new labs needed   If you have labs (blood work) drawn today and your tests are completely normal, you will receive your results only by: Marland Kitchen MyChart Message (if you have MyChart) OR . A paper copy in the mail If you have any lab test that is abnormal or we need to change your treatment, we will call you to review the results.   Testing/Procedures: No new testing needed   Follow-Up: At [facility], you and your health needs are our priority.  As part of our continuing mission to provide you with exceptional heart care, we have created designated Provider Care Teams.  These Care Teams include your primary Cardiologist (physician) and Advanced Practice Providers (APPs -  Physician Assistants and Nurse Practitioners) who all work together to provide you with the care you need, when you need it.  . You will need a follow up appointment in 12 months .   Please call our office 2 months in advance to schedule this appointment.    . Providers on your designated Care Team:   . Murray Hodgkins, NP . Christell Faith, PA-C . Marrianne Mood, PA-C  Any Other Special Instructions Will Be Listed Below (If Applicable).  For educational health videos Log in to : [email or web address] Or : [email or web address], password : [email or web address]

## 2018-09-23 NOTE — Telephone Encounter (Signed)
Call placed to the patient. She stated that she had major surgery in December and since then has been experiencing low rates in the 30's. At her hospital stay, she stated that they recommended that she discontniue her medications until she could follow up with cardiology. She currently only takes the protonix and vitamins.   Her heart rate has increased since being home to the 60's but she occasionally feels a tightness in her chest. She denied any current chest pain or shortness of breath.  An appointment has been made for today with Dr Rockey Situ at 3:20. She has verbalized her understanding.

## 2018-10-10 DIAGNOSIS — Z9884 Bariatric surgery status: Secondary | ICD-10-CM | POA: Diagnosis not present

## 2018-10-10 DIAGNOSIS — R5381 Other malaise: Secondary | ICD-10-CM | POA: Diagnosis not present

## 2018-10-10 DIAGNOSIS — R51 Headache: Secondary | ICD-10-CM | POA: Diagnosis not present

## 2018-10-11 DIAGNOSIS — Z9884 Bariatric surgery status: Secondary | ICD-10-CM | POA: Diagnosis not present

## 2018-10-29 ENCOUNTER — Ambulatory Visit: Payer: 59 | Admitting: Cardiovascular Disease

## 2018-10-30 ENCOUNTER — Encounter

## 2018-11-01 DIAGNOSIS — Z9884 Bariatric surgery status: Secondary | ICD-10-CM | POA: Diagnosis not present

## 2018-11-01 DIAGNOSIS — Z713 Dietary counseling and surveillance: Secondary | ICD-10-CM | POA: Diagnosis not present

## 2018-11-05 ENCOUNTER — Ambulatory Visit: Payer: 59 | Admitting: Cardiovascular Disease

## 2019-01-09 DIAGNOSIS — G4700 Insomnia, unspecified: Secondary | ICD-10-CM | POA: Diagnosis not present

## 2019-01-09 DIAGNOSIS — I959 Hypotension, unspecified: Secondary | ICD-10-CM | POA: Diagnosis not present

## 2019-01-09 DIAGNOSIS — G43109 Migraine with aura, not intractable, without status migrainosus: Secondary | ICD-10-CM | POA: Diagnosis not present

## 2019-01-23 DIAGNOSIS — D485 Neoplasm of uncertain behavior of skin: Secondary | ICD-10-CM | POA: Diagnosis not present

## 2019-01-23 DIAGNOSIS — Z1283 Encounter for screening for malignant neoplasm of skin: Secondary | ICD-10-CM | POA: Diagnosis not present

## 2019-01-23 DIAGNOSIS — L259 Unspecified contact dermatitis, unspecified cause: Secondary | ICD-10-CM | POA: Diagnosis not present

## 2019-01-30 ENCOUNTER — Telehealth: Payer: Self-pay | Admitting: Cardiovascular Disease

## 2019-01-30 NOTE — Telephone Encounter (Signed)
Virtual Visit Pre-Appointment Phone Call  "(Name), I am calling you today to discuss your upcoming appointment. We are currently trying to limit exposure to the virus that causes COVID-19 by seeing patients at home rather than in the office."  1. "What is the BEST phone number to call the day of the visit?" - include this in appointment notes  2. Do you have or have access to (through a family member/friend) a smartphone with video capability that we can use for your visit?" a. If yes - list this number in appt notes as cell (if different from BEST phone #) and list the appointment type as a VIDEO visit in appointment notes b. If no - list the appointment type as a PHONE visit in appointment notes  3. Confirm consent - "In the setting of the current Covid19 crisis, you are scheduled for a (phone or video) visit with your provider on (date) at (time).  Just as we do with many in-office visits, in order for you to participate in this visit, we must obtain consent.  If you'd like, I can send this to your mychart (if signed up) or email for you to review.  Otherwise, I can obtain your verbal consent now.  All virtual visits are billed to your insurance company just like a normal visit would be.  By agreeing to a virtual visit, we'd like you to understand that the technology does not allow for your provider to perform an examination, and thus may limit your provider's ability to fully assess your condition. If your provider identifies any concerns that need to be evaluated in person, we will make arrangements to do so.  Finally, though the technology is pretty good, we cannot assure that it will always work on either your or our end, and in the setting of a video visit, we may have to convert it to a phone-only visit.  In either situation, we cannot ensure that we have a secure connection.  Are you willing to proceed?" STAFF: Did the patient verbally acknowledge consent to telehealth visit? Document  YES/NO here: YES  4. Advise patient to be prepared - "Two hours prior to your appointment, go ahead and check your blood pressure, pulse, oxygen saturation, and your weight (if you have the equipment to check those) and write them all down. When your visit starts, your provider will ask you for this information. If you have an Apple Watch or Kardia device, please plan to have heart rate information ready on the day of your appointment. Please have a pen and paper handy nearby the day of the visit as well."  5. Give patient instructions for MyChart download to smartphone OR Doximity/Doxy.me as below if video visit (depending on what platform provider is using)  6. Inform patient they will receive a phone call 15 minutes prior to their appointment time (may be from unknown caller ID) so they should be prepared to answer    TELEPHONE CALL NOTE  RENELDA KILIAN has been deemed a candidate for a follow-up tele-health visit to limit community exposure during the Covid-19 pandemic. I spoke with the patient via phone to ensure availability of phone/video source, confirm preferred email & phone number, and discuss instructions and expectations.  I reminded Vanessa Medina to be prepared with any vital sign and/or heart rhythm information that could potentially be obtained via home monitoring, at the time of her visit. I reminded ANJANA CHEEK to expect a phone call prior to  her visit.  Ace Gins 01/30/2019 8:52 AM   INSTRUCTIONS FOR DOWNLOADING THE MYCHART APP TO SMARTPHONE  - The patient must first make sure to have activated MyChart and know their login information - If Apple, go to [employer] and type in MyChart in the search bar and download the app. If Android, ask patient to go to [employer] and type in [place] in the search bar and download the app. The app is free but as with any other app downloads, their phone may require them to verify saved payment information or  Apple/Android password.  - The patient will need to then log into the app with their MyChart username and password, and select [facility] as their healthcare provider to link the account. When it is time for your visit, go to the MyChart app, find appointments, and click Begin Video Visit. Be sure to Select Allow for your device to access the Microphone and Camera for your visit. You will then be connected, and your provider will be with you shortly.  **If they have any issues connecting, or need assistance please contact MyChart service desk [phone])**  **If using a computer, in order to ensure the best quality for their visit they will need to use either of the following Internet Browsers: [employer], or Google Chrome**  IF USING DOXIMITY or DOXY.ME - The patient will receive a link just prior to their visit by text.     FULL LENGTH CONSENT FOR TELE-HEALTH VISIT   I hereby voluntarily request, consent and authorize [facility] and its employed or contracted physicians, physician assistants, nurse practitioners or other licensed health care professionals (the Practitioner), to provide me with telemedicine health care services (the Services") as deemed necessary by the treating Practitioner. I acknowledge and consent to receive the Services by the Practitioner via telemedicine. I understand that the telemedicine visit will involve communicating with the Practitioner through live audiovisual communication technology and the disclosure of certain medical information by electronic transmission. I acknowledge that I have been given the opportunity to request an in-person assessment or other available alternative prior to the telemedicine visit and am voluntarily participating in the telemedicine visit.  I understand that I have the right to withhold or withdraw my consent to the use of telemedicine in the course of my care at any time, without affecting my right to future care  or treatment, and that the Practitioner or I may terminate the telemedicine visit at any time. I understand that I have the right to inspect all information obtained and/or recorded in the course of the telemedicine visit and may receive copies of available information for a reasonable fee.  I understand that some of the potential risks of receiving the Services via telemedicine include:   Delay or interruption in medical evaluation due to technological equipment failure or disruption;  Information transmitted may not be sufficient (e.g. poor resolution of images) to allow for appropriate medical decision making by the Practitioner; and/or   In rare instances, security protocols could fail, causing a breach of personal health information.  Furthermore, I acknowledge that it is my responsibility to provide information about my medical history, conditions and care that is complete and accurate to the best of my ability. I acknowledge that Practitioner's advice, recommendations, and/or decision may be based on factors not within their control, such as incomplete or inaccurate data provided by me or distortions of diagnostic images or specimens that may result from electronic transmissions. I understand  that the practice of medicine is not an exact science and that Practitioner makes no warranties or guarantees regarding treatment outcomes. I acknowledge that I will receive a copy of this consent concurrently upon execution via email to the email address I last provided but may also request a printed copy by calling the office of [facility].    I understand that my insurance will be billed for this visit.   I have read or had this consent read to me.  I understand the contents of this consent, which adequately explains the benefits and risks of the Services being provided via telemedicine.   I have been provided ample opportunity to ask questions regarding this consent and the Services and have had  my questions answered to my satisfaction.  I give my informed consent for the services to be provided through the use of telemedicine in my medical care  By participating in this telemedicine visit I agree to the above.

## 2019-02-07 ENCOUNTER — Telehealth: Payer: Self-pay | Admitting: Cardiovascular Disease

## 2019-02-07 ENCOUNTER — Telehealth: Payer: 59 | Admitting: Cardiovascular Disease

## 2019-02-07 ENCOUNTER — Other Ambulatory Visit: Payer: Self-pay

## 2019-02-07 NOTE — Telephone Encounter (Signed)
Spoke with patient and reviewed that we needed to reschedule her appointment due to emergency. She states that her blood pressures have been low and she has been having dizziness. She reports PCP did start her on medication but it makes her sick. Instructed her to continue monitoring her blood pressures, heart rates, and keep a log of those readings. Rescheduled and she requested first available time so there is less risk of it being rescheduled. Advised that I would note that. Reviewed that she should call us back if her symptoms should worsen before that time. She verbalized understanding and had no further questions at this time.

## 2019-02-16 NOTE — Progress Notes (Signed)
Virtual Visit via Video Note   This visit type was conducted due to national recommendations for restrictions regarding the COVID-19 Pandemic (e.g. social distancing) in an effort to limit this patient's exposure and mitigate transmission in our community.  Due to her co-morbid illnesses, this patient is at least at moderate risk for complications without adequate follow up.  This format is felt to be most appropriate for this patient at this time.  All issues noted in this document were discussed and addressed.  A limited physical exam was performed with this format.  Please refer to the patient's chart for her consent to telehealth for [facility].   I connected with  Vanessa Medina on 02/17/19 by a video enabled telemedicine application and verified that I am speaking with the correct person using two identifiers. I discussed the limitations of evaluation and management by telemedicine. The patient expressed understanding and agreed to proceed.   Evaluation Performed:  Follow-up visit  Date:  02/17/2019   ID:  MAKEBA Medina, DOB [birth year], MRN [id number]  Patient Location:  [address] [place] [address]   Provider location:   [facility], [place] office  PCP:  Maryland Pink, MD  Cardiologist:  Patsy Baltimore   Chief Complaint:  Dizzy   History of Present Illness:    Vanessa Medina is a 48 y.o. female who presents via audio/video conferencing for a telehealth visit today.   The patient does not symptoms concerning for COVID-19 infection (fever, chills, cough, or new SHORTNESS OF BREATH).   Patient has a past medical history of Nonsmoker No diabetes With history of hypertension, Morbid obesity History of gastric sleeve 3 years ago Who presents for follow-up of her labile blood pressure, hypotension and orthostasis  Low pressure Seen by PMD Pressure down to 80s Back on midodrine 5 mg pill daily at 6 Am Worse in the Am Pressures  still low Dizzy, nausea Sometimes better in the Pm  Vertigo, since may 2020 Dizzy when in bed  Normal BMs  Other hx reviewed major surgery in December 2019 Told she had significant adhesions Several trips back to the hospital for various reasons including dehydration During her hospital visit September 05, 2018 and September 18, 2018 she reported heart rate was running low, medications were held  Since medications held heart rate typically running 50 up to 60, blood pressure stable but very occasionally with low heart rate.  One episode down to 40 in the setting of upper epigastric pain  Cardiac studies reviewed with her in detail Echocardiogram August 14, 2018 - Left ventricle: The cavity size was normal. Wall thickness was increased in a pattern of mild LVH. Systolic function was normal. The estimated ejection fraction was in the range of 55% to 60%. Wall motion was normal; there were no regional wall motion abnormalities. Left ventricular diastolic function parameters were normal. - Right ventricle: The cavity size was normal. Wall thickness was normal. Systolic function was normal.   Prior CV studies:   The following studies were reviewed today:    Past Medical History:  Diagnosis Date  . Anemia   . Cervical cancer ([place])   . Family history of adverse reaction to anesthesia     " my mother takes a long time time wake up"  . Headache    migraine  . History of shingles   . Hypertension   . Migraine   . Multiple allergies   . Obesity   .  Osteoarthritis    left hip  . PONV (postoperative nausea and vomiting)   . Sleep apnea    does not wear CPAP  . Wears glasses    Past Surgical History:  Procedure Laterality Date  . ABDOMINAL HYSTERECTOMY    . APPENDECTOMY    . BILIOPANCREATIC DIVERSION     with duodenal switch laparoscopic   . CHOLECYSTECTOMY    . DIAGNOSTIC LAPAROSCOPY     LOA  . ESOPHAGOGASTRODUODENOSCOPY (EGD) WITH PROPOFOL N/A  07/25/2017   Procedure: ESOPHAGOGASTRODUODENOSCOPY (EGD) WITH PROPOFOL;  Surgeon: Manya Silvas, MD;  Location: [facility] END[facility]COPY;  Service: Endoscopy;  Laterality: N/A;  . KNEE ARTHROSCOPY    . LAPAROSCOPIC GASTRIC SLEEVE RESECTION WITH HIATAL HERNIA REPAIR    . TONSILLECTOMY    . TOTAL HIP ARTHROPLASTY Left 01/26/2016   Procedure: LEFT TOTAL HIP ARTHROPLASTY ANTERIOR APPROACH;  Surgeon: Leandrew Koyanagi, MD;  Location: [facility];  Service: Orthopedics;  Laterality: Left;     No outpatient medications have been marked as taking for the 02/17/19 encounter (Appointment) with Minna Merritts, MD.     Allergies:   Shellfish allergy, Meperidine hcl, Morphine, Bee pollen, Ivp dye [iodinated diagnostic agents], Codeine, Oxycodone-acetaminophen, Pentazocine lactate, and Propoxyphene n-acetaminophen   Social History   Tobacco Use  . Smoking status: Never Smoker  . Smokeless tobacco: Never Used  Substance Use Topics  . Alcohol use: Yes    Comment: occasional wine  . Drug use: No     Current Outpatient Medications on File Prior to Visit  Medication Sig Dispense Refill  . Calcium Carbonate-Vitamin D (CALTRATE 600+D PO) Take 1 tablet by mouth daily.     . cyanocobalamin (,VITAMIN B-12,) 1000 MCG/ML injection Inject 1,000 mcg into the muscle every 30 (thirty) days.    Marland Kitchen HYDROcodone-acetaminophen (HYCET) 7.5-325 mg/15 ml solution Take 15 mLs by mouth every 4 (four) hours as needed for moderate pain.     . Multiple Vitamins-Minerals (MULTIVITAMIN WITH MINERALS) tablet Take 1 tablet by mouth daily.    . ondansetron (ZOFRAN-ODT) 4 MG disintegrating tablet Take 4 mg by mouth every 8 (eight) hours as needed for nausea or vomiting.     . pantoprazole (PROTONIX) 40 MG tablet Take 40 mg by mouth daily.    . pramipexole (MIRAPEX) 0.125 MG tablet Take 0.5 mg by mouth at bedtime. Pt takes 4 tablets at bedtime    . topiramate (TOPAMAX) 25 MG tablet Take 25 mg by mouth 2 (two) times daily.     No current  facility-administered medications on file prior to visit.      Family Hx: The patient's family history includes Breast cancer in her cousin; Diabetes in an other family member; Heart attack (age of onset: 41) in her brother; Heart disease in her mother; Heart failure in her mother; Hypertension in her mother; Renal Disease in her mother; Stroke in her brother; Sudden Cardiac Death in her mother; Valvular heart disease in her mother.  ROS:   Please see the history of present illness.    Review of Systems  Constitutional: Negative.   HENT: Negative.   Respiratory: Negative.   Cardiovascular: Negative.   Gastrointestinal: Negative.   Musculoskeletal: Negative.   Neurological: Positive for dizziness.  Psychiatric/Behavioral: Negative.   All other systems reviewed and are negative.     Labs/Other Tests and Data Reviewed:    Recent Labs: 09/18/2018: ALT 59; BUN 11; Creatinine, Ser 1.42; Hemoglobin 14.0; Platelets 571; Potassium 3.8; Sodium 136   Recent Lipid Panel  No results found for: CHOL, TRIG, HDL, CHOLHDL, LDLCALC, LDLDIRECT  Wt Readings from Last 3 Encounters:  09/23/18 247 lb 8 oz (112.3 kg)  09/05/18 263 lb (119.3 kg)  08/06/18 267 lb (121.1 kg)     Exam:    Vital Signs: Vital signs may also be detailed in the HPI There were no vitals taken for this visit.  Wt Readings from Last 3 Encounters:  09/23/18 247 lb 8 oz (112.3 kg)  09/05/18 263 lb (119.3 kg)  08/06/18 267 lb (121.1 kg)   Temp Readings from Last 3 Encounters:  09/19/18 98 F (36.7 C)  09/05/18 98.1 F (36.7 C) (Oral)  07/25/17 97.6 F (36.4 C) (Tympanic)   BP Readings from Last 3 Encounters:  09/23/18 100/62  09/19/18 106/69  09/05/18 127/67   Pulse Readings from Last 3 Encounters:  09/23/18 63  09/19/18 76  09/05/18 87     Well nourished, well developed female in no acute distress. Constitutional:  oriented to person, place, and time. No distress.  Head: Normocephalic and atraumatic.   Eyes:  no discharge. No scleral icterus.  Neck: Normal range of motion. Neck supple.  Pulmonary/Chest: No audible wheezing, no distress, appears comfortable Musculoskeletal: Normal range of motion.  no  tenderness or deformity.  Neurological:   Coordination normal. Full exam not performed Skin:  No rash Psychiatric:  normal mood and affect. behavior is normal. Thought content normal.    ASSESSMENT & PLAN:    Labile blood pressure -  Having low pressure Recommended she stay hydrated Midodrine 10 mg Am and noon Check orthostatics May need to to add florinef  MORBID OBESITY  Weight stable  Chest tightness -  No sx  Bradycardia -  No sx  Localized edema -  Stable  Palpitations -  Stable  Vertigo Try meclizine   COVID-19 Education: The signs and symptoms of COVID-19 were discussed with the patient and how to seek care for testing (follow up with PCP or arrange E-visit).  The importance of social distancing was discussed today.  Patient Risk:   After full review of this patients clinical status, I feel that they are at least moderate risk at this time.  Time:   Today, I have spent 25 minutes with the patient with telehealth technology discussing the cardiac and medical problems/diagnoses detailed above   10 min spent reviewing the chart prior to patient visit today   Medication Adjustments/Labs and Tests Ordered: Current medicines are reviewed at length with the patient today.  Concerns regarding medicines are outlined above.   Tests Ordered: No tests ordered   Medication Changes: No changes made   Disposition: Follow-up in 6 months   Signed, Ida Rogue, MD  02/17/2019 9:57 AM    [facility] Office [address] [place] [address], [place],  [address]

## 2019-02-17 ENCOUNTER — Other Ambulatory Visit: Payer: Self-pay

## 2019-02-17 ENCOUNTER — Telehealth (INDEPENDENT_AMBULATORY_CARE_PROVIDER_SITE_OTHER): Payer: 59 | Admitting: Cardiovascular Disease

## 2019-02-17 DIAGNOSIS — R0789 Other chest pain: Secondary | ICD-10-CM

## 2019-02-17 DIAGNOSIS — R600 Localized edema: Secondary | ICD-10-CM

## 2019-02-17 DIAGNOSIS — R001 Bradycardia, unspecified: Secondary | ICD-10-CM | POA: Diagnosis not present

## 2019-02-17 DIAGNOSIS — R0989 Other specified symptoms and signs involving the circulatory and respiratory systems: Secondary | ICD-10-CM | POA: Diagnosis not present

## 2019-02-17 DIAGNOSIS — R002 Palpitations: Secondary | ICD-10-CM

## 2019-02-17 MED ORDER — MIDODRINE HCL 10 MG PO TABS: 10.0000 mg | ORAL_TABLET | Freq: Three times a day (TID) | ORAL | 3 refills | Status: DC

## 2019-02-17 NOTE — Patient Instructions (Addendum)
Medication Instructions:  Your physician has recommended you make the following change in your medication:  1. INCREASE Midodrine 10 mg to three times a day.  If you need a refill on your cardiac medications before your next appointment, please call your pharmacy.   Lab work: No new labs needed   If you have labs (blood work) drawn today and your tests are completely normal, you will receive your results only by: Marland Kitchen MyChart Message (if you have MyChart) OR . A paper copy in the mail If you have any lab test that is abnormal or we need to change your treatment, we will call you to review the results.   Testing/Procedures: No new testing needed   Follow-Up: At [facility], you and your health needs are our priority.  As part of our continuing mission to provide you with exceptional heart care, we have created designated Provider Care Teams.  These Care Teams include your primary Cardiologist (physician) and Advanced Practice Providers (APPs -  Physician Assistants and Nurse Practitioners) who all work together to provide you with the care you need, when you need it.  . You will need a follow up appointment in 12 months .   Please call our office 2 months in advance to schedule this appointment.    . Providers on your designated Care Team:   . Murray Hodgkins, NP . Christell Faith, PA-C . Marrianne Mood, PA-C  Any Other Special Instructions Will Be Listed Below (If Applicable).  For educational health videos Log in to : [email or web address] Or : [email or web address], password : [email or web address]

## 2019-02-21 ENCOUNTER — Other Ambulatory Visit: Payer: Self-pay

## 2019-02-21 ENCOUNTER — Ambulatory Visit (INDEPENDENT_AMBULATORY_CARE_PROVIDER_SITE_OTHER): Payer: 59 | Admitting: Orthopaedic Surgery

## 2019-02-21 ENCOUNTER — Ambulatory Visit: Payer: Self-pay

## 2019-02-21 ENCOUNTER — Encounter: Payer: Self-pay | Admitting: Orthopaedic Surgery

## 2019-02-21 DIAGNOSIS — M25561 Pain in right knee: Secondary | ICD-10-CM | POA: Diagnosis not present

## 2019-02-21 DIAGNOSIS — M1711 Unilateral primary osteoarthritis, right knee: Secondary | ICD-10-CM

## 2019-02-21 DIAGNOSIS — G8929 Other chronic pain: Secondary | ICD-10-CM | POA: Diagnosis not present

## 2019-02-21 MED ORDER — LIDOCAINE HCL 1 % IJ SOLN
2.0000 mL | INTRAMUSCULAR | Status: AC | PRN
  Administered 2019-02-21: 2 mL

## 2019-02-21 MED ORDER — METHYLPREDNISOLONE ACETATE 40 MG/ML IJ SUSP
40.0000 mg | INTRAMUSCULAR | Status: AC | PRN
  Administered 2019-02-21: 40 mg via INTRA_ARTICULAR

## 2019-02-21 MED ORDER — BUPIVACAINE HCL 0.25 % IJ SOLN
2.0000 mL | INTRAMUSCULAR | Status: AC | PRN
  Administered 2019-02-21: 2 mL via INTRA_ARTICULAR

## 2019-02-21 NOTE — Progress Notes (Signed)
Office Visit Note   Patient: Vanessa Medina           Date of Birth: Jan 19, 1971           MRN: [id number] Visit Date: 02/21/2019              Requested by: Maryland Pink, MD [address] [place],  [place] [address] PCP: Maryland Pink, MD   Assessment & Plan: Visit Diagnoses:  1. Osteoarthritis of right patellofemoral joint   2. Chronic pain of right knee     Plan: Impression is right knee patellofemoral arthritis.  We will inject the right knee with cortisone today.  Also provided the patient with a viscosupplementation handout should she not have relief with cortisone injection.  She will follow-up with [name] as needed.  Follow-Up Instructions: Return if symptoms worsen or fail to improve.   Orders:  Orders Placed This Encounter  Procedures   Large Joint Inj: R knee   XR Knee 1-2 Views Right   No orders of the defined types were placed in this encounter.     Procedures: Large Joint Inj: R knee on 02/21/2019 8:54 AM Indications: pain Details: 22 G needle, anterolateral approach Medications: 2 mL bupivacaine 0.25 %; 2 mL lidocaine 1 %; 40 mg methylPREDNISolone acetate 40 MG/ML      Clinical Data: No additional findings.   Subjective: Chief Complaint  Patient presents with   Right Knee - Pain    HPI patient is a pleasant 48 year old female presents our clinic today with right knee pain.  This began approximately 1 month ago.  No new injury.  She does note around that time she started working more at home and was seated for longer periods of time.  The pain she has to the entire aspect of the right knee.  She describes this as a constant throb with occasional locking catching.  She does note occasional swelling as well.  Pain is worse going from a seated to standing position as well as going up and down stairs.  She is tried ibuprofen with moderate relief of symptoms.  No numbness, tingling or burning.  She does have a history of right knee  ACL reconstruction with what sounds like patella tendon approximately 25 years ago from a physician in [place].  No previous cortisone injection to the right knee.  Review of Systems as detailed in HPI.  All others reviewed and are negative.   Objective: Vital Signs: There were no vitals taken for this visit.  Physical Exam well-developed well-nourished female no acute distress.  Alert and oriented x3.  Ortho Exam examination of the right knee shows a trace effusion.  Range of motion 0 to 110 degrees.  Medial and lateral joint line tenderness.  Marked patellofemoral crepitus.  Ligaments are stable.  She is neurovascularly intact distally.  Specialty Comments:  No specialty comments available.  Imaging: Xr Knee 1-2 Views Right  Result Date: 02/21/2019 X-rays of the right knee show marked patellofemoral changes with periarticular spurring medial and lateral compartments    PMFS History: Patient Active Problem List   Diagnosis Date Noted   Chest tightness 09/23/2018   Bradycardia 09/23/2018   Labile blood pressure 03/08/2018   Chronic low back pain 09/03/2017   History of total hip replacement, left 01/26/2016   EDEMA 04/28/2008   LEG PAIN, BILATERAL 12/16/2007   CERVICAL CANCER 07/02/2007   MORBID OBESITY 07/02/2007   DEPRESSION 07/02/2007   COMMON MIGRAINE 07/02/2007  ALLERGIC RHINITIS 07/02/2007   ASTHMA 07/02/2007   GERD 07/02/2007   ELEVATED BLOOD PRESSURE WITHOUT DIAGNOSIS OF HYPERTENSION 07/02/2007   Past Medical History:  Diagnosis Date   Anemia    Cervical cancer ([place])    Family history of adverse reaction to anesthesia     " my mother takes a long time time wake up"   Headache    migraine   History of shingles    Hypertension    Migraine    Multiple allergies    Obesity    Osteoarthritis    left hip   PONV (postoperative nausea and vomiting)    Sleep apnea    does not wear CPAP   Wears glasses     Family History    Problem Relation Age of Onset   Renal Disease Mother    Hypertension Mother    Sudden Cardiac Death Mother    Heart failure Mother    Valvular heart disease Mother    Heart disease Mother    Stroke Brother    Heart attack Brother 72   Diabetes Other    Breast cancer Cousin        paternal side    Past Surgical History:  Procedure Laterality Date   ABDOMINAL HYSTERECTOMY     APPENDECTOMY     BILIOPANCREATIC DIVERSION     with duodenal switch laparoscopic    CHOLECYSTECTOMY     DIAGNOSTIC LAPAROSCOPY     LOA   ESOPHAGOGASTRODUODENOSCOPY (EGD) WITH PROPOFOL N/A 07/25/2017   Procedure: ESOPHAGOGASTRODUODENOSCOPY (EGD) WITH PROPOFOL;  Surgeon: Manya Silvas, MD;  Location: [facility] END[facility]COPY;  Service: Endoscopy;  Laterality: N/A;   KNEE ARTHROSCOPY     LAPAROSCOPIC GASTRIC SLEEVE RESECTION WITH HIATAL HERNIA REPAIR     TONSILLECTOMY     TOTAL HIP ARTHROPLASTY Left 01/26/2016   Procedure: LEFT TOTAL HIP ARTHROPLASTY ANTERIOR APPROACH;  Surgeon: Leandrew Koyanagi, MD;  Location: [facility];  Service: Orthopedics;  Laterality: Left;   Social History   Occupational History   Not on file  Tobacco Use   Smoking status: Never Smoker   Smokeless tobacco: Never Used  Substance and Sexual Activity   Alcohol use: Yes    Comment: occasional wine   Drug use: No   Sexual activity: Not on file

## 2019-05-20 ENCOUNTER — Encounter: Payer: Self-pay | Admitting: Orthopaedic Surgery

## 2019-05-20 ENCOUNTER — Ambulatory Visit: Payer: Self-pay

## 2019-05-20 ENCOUNTER — Ambulatory Visit (INDEPENDENT_AMBULATORY_CARE_PROVIDER_SITE_OTHER): Payer: 59 | Admitting: Orthopaedic Surgery

## 2019-05-20 VITALS — Ht 64.0 in | Wt 250.0 lb

## 2019-05-20 DIAGNOSIS — G8929 Other chronic pain: Secondary | ICD-10-CM | POA: Diagnosis not present

## 2019-05-20 DIAGNOSIS — M545 Low back pain, unspecified: Secondary | ICD-10-CM

## 2019-05-20 MED ORDER — DICLOFENAC SODIUM 75 MG PO TBEC: 75.0000 mg | DELAYED_RELEASE_TABLET | Freq: Two times a day (BID) | ORAL | 2 refills | Status: DC

## 2019-05-20 MED ORDER — TRAMADOL HCL 50 MG PO TABS: 50.0000 mg | ORAL_TABLET | Freq: Two times a day (BID) | ORAL | 2 refills | Status: DC | PRN

## 2019-05-20 NOTE — Progress Notes (Signed)
Office Visit Note   Patient: Vanessa Medina           Date of Birth: 1971/01/15           MRN: [id number] Visit Date: 05/20/2019              Requested by: Maryland Pink, MD [address] [place],  [place] [address] PCP: Maryland Pink, MD   Assessment & Plan: Visit Diagnoses:  1. Chronic bilateral low back pain without sciatica     Plan: Impression is continued chronic low back pain with recent worsening.  Patient is expressing significant increase in pain despite conservative treatment therefore we will obtain MRI to rule out structural abnormalities.  In the meantime prescription for tramadol and diclofenac were sent in.  We will see her back after the MRI.  Follow-Up Instructions: Return if symptoms worsen or fail to improve.   Orders:  Orders Placed This Encounter  Procedures   XR Lumbar Spine 2-3 Views   MR Lumbar Spine w/o contrast   Meds ordered this encounter  Medications   traMADol (ULTRAM) 50 MG tablet    Sig: Take 1-2 tablets (50-100 mg total) by mouth 2 (two) times daily as needed.    Dispense:  30 tablet    Refill:  2   diclofenac (VOLTAREN) 75 MG EC tablet    Sig: Take 1 tablet (75 mg total) by mouth 2 (two) times daily.    Dispense:  30 tablet    Refill:  2      Procedures: No procedures performed   Clinical Data: No additional findings.   Subjective: Chief Complaint  Patient presents with   Vanessa Medina is a 48 year old female who is well-known to me who comes in for recurrent low back pain.  For the last 9 months this has progressively gotten worse.  She has made all efforts at weight loss.  She has been working out on a daily basis.  Physical therapy in the past has not helped.  She states that she has intense low back pain with some radiation into both of her legs.  She takes ibuprofen daily but this causes some reflux.  She is very frustrated with her chronic pain.  She has had previous lumbar  epidural steroid injections by Dr. Ernestina Patches and by Dr. Assunta Curtis at [facility] which she feels did not help significantly.   Review of Systems  Constitutional: Negative.   HENT: Negative.   Eyes: Negative.   Respiratory: Negative.   Cardiovascular: Negative.   Endocrine: Negative.   Musculoskeletal: Negative.   Neurological: Negative.   Hematological: Negative.   Psychiatric/Behavioral: Negative.   All other systems reviewed and are negative.    Objective: Vital Signs: Ht 5\' 4"  (1.626 m)    Wt 250 lb (113.4 kg)    BMI 42.91 kg/m   Physical Exam Vitals signs and nursing note reviewed.  Constitutional:      Appearance: She is well-developed.  Pulmonary:     Effort: Pulmonary effort is normal.  Skin:    General: Skin is warm.     Capillary Refill: Capillary refill takes less than 2 seconds.  Neurological:     Mental Status: She is alert and oriented to person, place, and time.  Psychiatric:        Behavior: Behavior normal.        Thought Content: Thought content normal.        Judgment: Judgment  normal.     Ortho Exam Lumbar spine exam shows tenderness along the spinous processes.  Lateral hips are nontender.  No focal motor or sensory deficits.  SI joints are nontender. Specialty Comments:  No specialty comments available.  Imaging: Xr Lumbar Spine 2-3 Views  Result Date: 05/20/2019 Straightening of the lumbar spine with large anterior spurring of L3-L4.  Lumbar spondylosis.    PMFS History: Patient Active Problem List   Diagnosis Date Noted   Chest tightness 09/23/2018   Bradycardia 09/23/2018   Labile blood pressure 03/08/2018   Chronic low back pain 09/03/2017   History of total hip replacement, left 01/26/2016   EDEMA 04/28/2008   LEG PAIN, BILATERAL 12/16/2007   CERVICAL CANCER 07/02/2007   MORBID OBESITY 07/02/2007   DEPRESSION 07/02/2007   COMMON MIGRAINE 07/02/2007   ALLERGIC RHINITIS 07/02/2007   ASTHMA 07/02/2007   GERD 07/02/2007    ELEVATED BLOOD PRESSURE WITHOUT DIAGNOSIS OF HYPERTENSION 07/02/2007   Past Medical History:  Diagnosis Date   Anemia    Cervical cancer ([place])    Family history of adverse reaction to anesthesia     " my mother takes a long time time wake up"   Headache    migraine   History of shingles    Hypertension    Migraine    Multiple allergies    Obesity    Osteoarthritis    left hip   PONV (postoperative nausea and vomiting)    Sleep apnea    does not wear CPAP   Wears glasses     Family History  Problem Relation Age of Onset   Renal Disease Mother    Hypertension Mother    Sudden Cardiac Death Mother    Heart failure Mother    Valvular heart disease Mother    Heart disease Mother    Stroke Brother    Heart attack Brother 16   Diabetes Other    Breast cancer Cousin        paternal side    Past Surgical History:  Procedure Laterality Date   ABDOMINAL HYSTERECTOMY     APPENDECTOMY     BILIOPANCREATIC DIVERSION     with duodenal switch laparoscopic    CHOLECYSTECTOMY     DIAGNOSTIC LAPAROSCOPY     LOA   ESOPHAGOGASTRODUODENOSCOPY (EGD) WITH PROPOFOL N/A 07/25/2017   Procedure: ESOPHAGOGASTRODUODENOSCOPY (EGD) WITH PROPOFOL;  Surgeon: Manya Silvas, MD;  Location[facility] ENDOSCOPY;  Service: Endoscopy;  Laterality: N/A;   KNEE ARTHROSCOPY     LAPAROSCOPIC GASTRIC SLEEVE RESECTION WITH HIATAL HERNIA REPAIR     TONSILLECTOMY     TOTAL HIP ARTHROPLASTY Left 01/26/2016   Procedure: LEFT TOTAL HIP ARTHROPLASTY ANTERIOR APPROACH;  Surgeon: Leandrew Koyanagi, MD;  Location: [place];  Service: Orthopedics;  Laterality: Left;   Social History   Occupational History   Not on file  Tobacco Use   Smoking status: Never Smoker   Smokeless tobacco: Never Used  Substance and Sexual Activity   Alcohol use: Yes    Comment: occasional wine   Drug use: No   Sexual activity: Not on file

## 2019-05-27 ENCOUNTER — Other Ambulatory Visit: Payer: Self-pay | Admitting: Obstetrics and Gynecology

## 2019-05-27 DIAGNOSIS — N6320 Unspecified lump in the left breast, unspecified quadrant: Secondary | ICD-10-CM

## 2019-06-14 ENCOUNTER — Other Ambulatory Visit: Payer: 59

## 2019-06-17 ENCOUNTER — Ambulatory Visit: Payer: 59 | Admitting: Orthopaedic Surgery

## 2019-06-21 ENCOUNTER — Ambulatory Visit
Admission: RE | Admit: 2019-06-21 | Discharge: 2019-06-21 | Disposition: A | Discharge status: Home / Self Care | Payer: 59 | Source: Ambulatory Visit | Attending: Orthopaedic Surgery | Admitting: Orthopaedic Surgery

## 2019-06-21 ENCOUNTER — Other Ambulatory Visit: Payer: Self-pay

## 2019-06-21 DIAGNOSIS — G8929 Other chronic pain: Secondary | ICD-10-CM

## 2019-06-24 ENCOUNTER — Ambulatory Visit (INDEPENDENT_AMBULATORY_CARE_PROVIDER_SITE_OTHER): Payer: 59 | Admitting: Orthopaedic Surgery

## 2019-06-24 ENCOUNTER — Other Ambulatory Visit: Payer: Self-pay

## 2019-06-24 ENCOUNTER — Encounter: Payer: Self-pay | Admitting: Orthopaedic Surgery

## 2019-06-24 DIAGNOSIS — M5440 Lumbago with sciatica, unspecified side: Secondary | ICD-10-CM | POA: Diagnosis not present

## 2019-06-24 DIAGNOSIS — G8929 Other chronic pain: Secondary | ICD-10-CM

## 2019-06-24 NOTE — Progress Notes (Signed)
Office Visit Note   Patient: Vanessa Medina           Date of Birth: 1970/09/26           MRN: [id number] Visit Date: 06/24/2019              Requested by: Maryland Pink, MD [address] [facility] [place],  [place] [address] PCP: Maryland Pink, MD   Assessment & Plan: Visit Diagnoses:  1. Chronic low back pain with sciatica, sciatica laterality unspecified, unspecified back pain laterality     Plan: MRI of the lumbar spine was reviewed with the patient in detail.  At this point patient continues to have back pain despite conservative treatment.  We will refer patient to Dr. Louanne Skye for further evaluation and treatment.  Follow-Up Instructions: Return if symptoms worsen or fail to improve.   Orders:  Orders Placed This Encounter  Procedures  . Ambulatory referral to Orthopedic Surgery   No orders of the defined types were placed in this encounter.     Procedures: No procedures performed   Clinical Data: No additional findings.   Subjective: Chief Complaint  Patient presents with  . Lower Back - Pain    [name] returns today for follow-up of her lumbar spine MRI.  Denies any changes in medical history.   Review of Systems   Objective: Vital Signs: There were no vitals taken for this visit.  Physical Exam  Ortho Exam Exam is unchanged. Specialty Comments:  No specialty comments available.  Imaging: No results found.   PMFS History: Patient Active Problem List   Diagnosis Date Noted  . Chest tightness 09/23/2018  . Bradycardia 09/23/2018  . Labile blood pressure 03/08/2018  . Chronic low back pain 09/03/2017  . History of total hip replacement, left 01/26/2016  . EDEMA 04/28/2008  . LEG PAIN, BILATERAL 12/16/2007  . CERVICAL CANCER 07/02/2007  . MORBID OBESITY 07/02/2007  . DEPRESSION 07/02/2007  . COMMON MIGRAINE 07/02/2007  . ALLERGIC RHINITIS 07/02/2007  . ASTHMA 07/02/2007  . GERD 07/02/2007  . ELEVATED BLOOD PRESSURE  WITHOUT DIAGNOSIS OF HYPERTENSION 07/02/2007   Past Medical History:  Diagnosis Date  . Anemia   . Cervical cancer ([place])   . Family history of adverse reaction to anesthesia     " my mother takes a long time time wake up"  . Headache    migraine  . History of shingles   . Hypertension   . Migraine   . Multiple allergies   . Obesity   . Osteoarthritis    left hip  . PONV (postoperative nausea and vomiting)   . Sleep apnea    does not wear CPAP  . Wears glasses     Family History  Problem Relation Age of Onset  . Renal Disease Mother   . Hypertension Mother   . Sudden Cardiac Death Mother   . Heart failure Mother   . Valvular heart disease Mother   . Heart disease Mother   . Stroke Brother   . Heart attack Brother 18  . Diabetes Other   . Breast cancer Cousin        paternal side    Past Surgical History:  Procedure Laterality Date  . ABDOMINAL HYSTERECTOMY    . APPENDECTOMY    . BILIOPANCREATIC DIVERSION     with duodenal switch laparoscopic   . CHOLECYSTECTOMY    . DIAGNOSTIC LAPAROSCOPY     LOA  . ESOPHAGOGASTRODUODENOSCOPY (EGD) WITH PROPOFOL N/A  07/25/2017   Procedure: ESOPHAGOGASTRODUODENOSCOPY (EGD) WITH PROPOFOL;  Surgeon: Manya Silvas, MD;  Location: [facility] ENDOSCOPY;  Service: Endoscopy;  Laterality: N/A;  . KNEE ARTHROSCOPY    . LAPAROSCOPIC GASTRIC SLEEVE RESECTION WITH HIATAL HERNIA REPAIR    . TONSILLECTOMY    . TOTAL HIP ARTHROPLASTY Left 01/26/2016   Procedure: LEFT TOTAL HIP ARTHROPLASTY ANTERIOR APPROACH;  Surgeon: Leandrew Koyanagi, MD;  Location: [place];  Service: Orthopedics;  Laterality: Left;   Social History   Occupational History  . Not on file  Tobacco Use  . Smoking status: Never Smoker  . Smokeless tobacco: Never Used  Substance and Sexual Activity  . Alcohol use: Yes    Comment: occasional wine  . Drug use: No  . Sexual activity: Not on file

## 2019-06-25 ENCOUNTER — Ambulatory Visit
Admission: RE | Admit: 2019-06-25 | Discharge: 2019-06-25 | Disposition: A | Discharge status: Home / Self Care | Payer: 59 | Source: Ambulatory Visit | Attending: Obstetrics and Gynecology | Admitting: Obstetrics and Gynecology

## 2019-06-25 DIAGNOSIS — N6320 Unspecified lump in the left breast, unspecified quadrant: Secondary | ICD-10-CM

## 2019-07-24 ENCOUNTER — Encounter: Payer: Self-pay | Admitting: Specialist

## 2019-07-24 ENCOUNTER — Ambulatory Visit: Payer: 59 | Admitting: Specialist

## 2019-07-24 ENCOUNTER — Ambulatory Visit: Payer: Self-pay

## 2019-07-24 ENCOUNTER — Other Ambulatory Visit: Payer: Self-pay

## 2019-07-24 VITALS — BP 124/75 | HR 66 | Ht 64.0 in | Wt 250.0 lb

## 2019-07-24 DIAGNOSIS — M5440 Lumbago with sciatica, unspecified side: Secondary | ICD-10-CM

## 2019-07-24 DIAGNOSIS — M5136 Other intervertebral disc degeneration, lumbar region: Secondary | ICD-10-CM | POA: Diagnosis not present

## 2019-07-24 DIAGNOSIS — G8929 Other chronic pain: Secondary | ICD-10-CM

## 2019-07-24 MED ORDER — GABAPENTIN 300 MG PO CAPS: 300.0000 mg | ORAL_CAPSULE | Freq: Every day | ORAL | 2 refills | Status: DC

## 2019-07-24 NOTE — Progress Notes (Signed)
Office Visit Note   Patient: Vanessa Medina           Date of Birth: [birth year]           MRN: [id number] Visit Date: 07/24/2019              Requested by: Maryland Pink, MD [address] [facility] [place],  [place] [address] PCP: Maryland Pink, MD   Assessment & Plan: Visit Diagnoses:  1. Chronic low back pain with sciatica, sciatica laterality unspecified, unspecified back pain laterality   2. Lumbar degenerative disc disease     Plan: Avoid bending, stooping and avoid lifting weights greater than 10 lbs. Avoid prolong standing and walking. Order for a new walker with wheels. Surgery scheduling secretary Kandice Hams, will call you in the next week to schedule for surgery.  Surgery recommended is a three level lumbar fusion L2-3,L3-4 and L4-5  this would be done with rods, screws and cages with local bone graft and allograft (donor bone graft). Take hydrocodone for for pain. Risk of surgery includes risk of infection 1 in 200 patients, bleeding 1/2% chance you would need a transfusion.   Risk to the nerves is one in 10,000. You will need to use a brace for 3 months and wean from the brace on the 4th month. Expect improved walking and standing tolerance. Expect relief of about 60-70% of back pain but tailbone pain may persist due to degenerative joint changes in the coccyx.       Follow-Up Instructions: Return in about 4 weeks (around 08/21/2019).   Orders:  Orders Placed This Encounter  Procedures  . XR Lumb Spine Flex&Ext Only   No orders of the defined types were placed in this encounter.     Procedures: No procedures performed   Clinical Data: No additional findings.   Subjective: Chief Complaint  Patient presents with  . Lower Back - Pain    48 year old female with history of back pain for years and relates this to falls she had remotely with L1 compression fracture 9 years ago treated by Dr. Jefm Bryant. She also has had more recent  falls from the attic twice 8 years ago and fell down stairs. She reports pain due to her back is severe 10 on scale of 1-10. With total hip replacement she has had significant relief of the groin and thigh pain. Almost all of her back is back pain. She has undergone conservative treatments with a home exercise program of 45 minutes per day Attempts at facet injections and ESIs by Dr. Ernestina Patches with no relief. No bowel or bladder difficulty. Some of the pain is in the coccyx and is worse with sitting.    Review of Systems  Constitutional: Positive for unexpected weight change (gastric sleeve surgery one year ago).  HENT: Negative.  Negative for congestion, dental problem, drooling, ear discharge, ear pain, facial swelling, hearing loss, mouth sores, postnasal drip, rhinorrhea, sinus pressure, sinus pain, sore throat, tinnitus and trouble swallowing.   Eyes: Negative.   Respiratory: Negative.   Cardiovascular: Negative for chest pain, palpitations and leg swelling.  Gastrointestinal: Negative.   Endocrine: Negative for cold intolerance and heat intolerance.  Genitourinary: Negative.  Negative for dyspareunia, dysuria and enuresis.  Musculoskeletal: Positive for back pain and gait problem. Negative for joint swelling, myalgias, neck pain and neck stiffness.  Skin: Negative.   Allergic/Immunologic: Negative for environmental allergies, food allergies and immunocompromised state.  Neurological: Positive for weakness (left leg  feels weak) and headaches. Negative for dizziness, tremors, seizures, syncope, facial asymmetry, speech difficulty and numbness.  Hematological: Negative.  Negative for adenopathy. Does not bruise/bleed easily.  Psychiatric/Behavioral: Negative for agitation, behavioral problems, confusion, decreased concentration, dysphoric mood, hallucinations, self-injury, sleep disturbance and suicidal ideas. The patient is not nervous/anxious and is not hyperactive.      Objective: Vital  Signs: BP 124/75 (BP Location: Left Arm, Patient Position: Sitting)   Pulse 66   Ht 5\' 4"  (1.626 m)   Wt 250 lb (113.4 kg)   BMI 42.91 kg/m   Physical Exam Constitutional:      Appearance: She is well-developed.  HENT:     Head: Normocephalic and atraumatic.  Eyes:     Pupils: Pupils are equal, round, and reactive to light.  Neck:     Musculoskeletal: Normal range of motion and neck supple.  Pulmonary:     Effort: Pulmonary effort is normal.     Breath sounds: Normal breath sounds.  Abdominal:     General: Bowel sounds are normal.     Palpations: Abdomen is soft.  Skin:    General: Skin is warm and dry.  Neurological:     Mental Status: She is alert and oriented to person, place, and time.  Psychiatric:        Behavior: Behavior normal.        Thought Content: Thought content normal.        Judgment: Judgment normal.     Back Exam   Tenderness  The patient is experiencing tenderness in the lumbar.  Range of Motion  Extension: abnormal  Flexion:  50 abnormal  Lateral bend right:  60 abnormal  Lateral bend left:  50 abnormal  Rotation right: 60  Rotation left: 50   Muscle Strength  Right Quadriceps:  5/5  Left Quadriceps:  5/5  Right Hamstrings:  5/5  Left Hamstrings:  5/5   Tests  Straight leg raise right: negative Straight leg raise left: negative  Reflexes  Patellar:  2/4 normal Achilles: 2/4 Biceps: 1/4 Babinski's sign: normal   Other  Heel walk: normal Sensation: normal Gait: Trendelenburg  Erythema: no back redness Scars: present  Comments:  She has pain with bending and pushes off thigh to regain upright position. Stiffness and difficulty straightening up with sitting for exam.       Specialty Comments:  No specialty comments available.  Imaging: No results found.   PMFS History: Patient Active Problem List   Diagnosis Date Noted  . Chest tightness 09/23/2018  . Bradycardia 09/23/2018  . Labile blood pressure 03/08/2018  .  Chronic low back pain 09/03/2017  . History of total hip replacement, left 01/26/2016  . EDEMA 04/28/2008  . LEG PAIN, BILATERAL 12/16/2007  . CERVICAL CANCER 07/02/2007  . MORBID OBESITY 07/02/2007  . DEPRESSION 07/02/2007  . COMMON MIGRAINE 07/02/2007  . ALLERGIC RHINITIS 07/02/2007  . ASTHMA 07/02/2007  . GERD 07/02/2007  . ELEVATED BLOOD PRESSURE WITHOUT DIAGNOSIS OF HYPERTENSION 07/02/2007   Past Medical History:  Diagnosis Date  . Anemia   . Cervical cancer ([place])   . Family history of adverse reaction to anesthesia     " my mother takes a long time time wake up"  . Headache    migraine  . History of shingles   . Hypertension   . Migraine   . Multiple allergies   . Obesity   . Osteoarthritis    left hip  . PONV (postoperative nausea and vomiting)   .  Sleep apnea    does not wear CPAP  . Wears glasses     Family History  Problem Relation Age of Onset  . Renal Disease Mother   . Hypertension Mother   . Sudden Cardiac Death Mother   . Heart failure Mother   . Valvular heart disease Mother   . Heart disease Mother   . Stroke Brother   . Heart attack Brother 36  . Diabetes Other   . Breast cancer Cousin        paternal side    Past Surgical History:  Procedure Laterality Date  . ABDOMINAL HYSTERECTOMY    . APPENDECTOMY    . BILIOPANCREATIC DIVERSION     with duodenal switch laparoscopic   . CHOLECYSTECTOMY    . DIAGNOSTIC LAPAROSCOPY     LOA  . ESOPHAGOGASTRODUODENOSCOPY (EGD) WITH PROPOFOL N/A 07/25/2017   Procedure: ESOPHAGOGASTRODUODENOSCOPY (EGD) WITH PROPOFOL;  Surgeon: Manya Silvas, MD;  Location: [facility] ENDOSCOPY;  Service: Endoscopy;  Laterality: N/A;  . KNEE ARTHROSCOPY    . LAPAROSCOPIC GASTRIC SLEEVE RESECTION WITH HIATAL HERNIA REPAIR    . TONSILLECTOMY    . TOTAL HIP ARTHROPLASTY Left 01/26/2016   Procedure: LEFT TOTAL HIP ARTHROPLASTY ANTERIOR APPROACH;  Surgeon: Leandrew Koyanagi, MD;  Location: [place];  Service: Orthopedics;  Laterality: Left;    Social History   Occupational History  . Not on file  Tobacco Use  . Smoking status: Never Smoker  . Smokeless tobacco: Never Used  Substance and Sexual Activity  . Alcohol use: Yes    Comment: occasional wine  . Drug use: No  . Sexual activity: Not on file

## 2019-07-24 NOTE — Patient Instructions (Addendum)
Plan: Avoid bending, stooping and avoid lifting weights greater than 10 lbs. Avoid prolong standing and walking. Order for a new walker with wheels. Surgery scheduling secretary Kandice Hams, will call you in the next week to schedule for surgery.  Surgery recommended is a three level lumbar fusion L2-3,L3-4 and L4-5  this would be done with rods, screws and cages with local bone graft and allograft (donor bone graft). Take hydrocodone for for pain. Risk of surgery includes risk of infection 1 in 200 patients, bleeding 1/2% chance you would need a transfusion.   Risk to the nerves is one in 10,000. You will need to use a brace for 3 months and wean from the brace on the 4th month. Expect improved walking and standing tolerance. Expect relief of about 60-70% of back pain but tailbone pain may persist due to degenerative joint changes in the coccyx.

## 2019-08-04 ENCOUNTER — Telehealth: Payer: Self-pay | Admitting: Cardiovascular Disease

## 2019-08-04 NOTE — Telephone Encounter (Signed)
° °  [facility] Pre-operative Risk Assessment    Request for surgical clearance:  1. What type of surgery is being performed? Lumbar Fusion   2. When is this surgery scheduled?  Not noted    3. What type of clearance is required (medical clearance vs. Pharmacy clearance to hold med vs. Both)?  medical  4. Are there any medications that need to be held prior to surgery and how long? Not noted    5. Practice name and name of physician performing surgery? [facility] Dr. Louanne Skye    6. What is your office phone number [phone]    7.   What is your office fax number [phone]  8.   Anesthesia type (None, local, MAC, general) ?  Not noted    Clarisse Gouge 08/04/2019, 4:22 PM  _________________________________________________________________   (provider comments below)

## 2019-08-05 HISTORY — PX: TRANSFORAMINAL LUMBAR INTERBODY FUSION (TLIF) WITH PEDICLE SCREW FIXATION 3 LEVEL: SHX6143

## 2019-08-05 NOTE — Telephone Encounter (Signed)
I called pt to update her status for clearance. No Answer. Left VM for pt to call back and ask for preop clinic.   She has no prior hx of CAD. Essentially normal echo in 08/2018. She has had issues with low blood pressure. She would be low risk for surgery. Advise close monitoring of blood pressure.

## 2019-08-05 NOTE — Telephone Encounter (Signed)
   Primary Cardiologist: Ida Rogue, MD  Chart reviewed as part of pre-operative protocol coverage. Patient was contacted 08/05/2019 in reference to pre-operative risk assessment for pending surgery as outlined below.  Vanessa Medina was last seen on 02/07/2019 via telemedicine by Dr. Rockey Situ.  Since that day, Vanessa Medina has done well. She is seen in our office for low blood pressure. She has had a normal echocardiogram. She is doing very well now on midodrine.  Therefore, based on ACC/AHA guidelines, the patient would be at acceptable risk for the planned procedure without further cardiovascular testing.   I will route this recommendation to the requesting party via Epic fax function and remove from pre-op pool.  Please call with questions.  Daune Perch, NP 08/05/2019, 10:06 AM

## 2019-08-11 ENCOUNTER — Encounter: Payer: Self-pay | Admitting: Specialist

## 2019-08-20 ENCOUNTER — Ambulatory Visit: Payer: 59 | Admitting: Surgery

## 2019-08-21 NOTE — Progress Notes (Signed)
[id number] - [place], [place] - [place] AT [place] [address] [place] [address] Phone: [phone] Fax: [phone]    Your procedure is scheduled on Tuesday, December 29th, 2020.   Report to [facility] Main Entrance "A" at 5:30 A.M., and check in at the Admitting office.   Call this number if you have problems the morning of surgery:  [phone]  Call [phone] if you have any questions prior to your surgery date Monday-Friday 8am-4pm    Remember:  Do not eat after midnight the night before your surgery  You may drink clear liquids until 4:30AM the morning of your surgery.    Clear liquids allowed are: Water, Non-Citrus Juices (without pulp), Carbonated Beverages, Clear Tea, Black Coffee Only, and Gatorade   Please complete your PRE-SURGERY ENSURE that was provided to you by 4:30AM the morning of surgery.  Please, if able, drink it in one setting. DO NOT SIP.    Take these medicines the morning of surgery with A SIP OF WATER :  Midodrine (Proamatine) Topiramate (Topamax) Tramadol (Ultram) - if needed  Between now and surgery STOP taking any Aspirin (unless otherwise instructed by your surgeon), Aleve, Naproxen, Ibuprofen, Motrin, Advil, Goody's, BC's, all herbal medications, fish oil, and all vitamins.    The Morning of Surgery  Do not wear jewelry, make-up or nail polish.  Do not wear lotions, powders, or perfumes/colognes, or deodorant  Do not shave 48 hours prior to surgery.   Do not bring valuables to the hospital.  [facility] is not responsible for any belongings or valuables.  If you are a smoker, DO NOT Smoke 24 hours prior to surgery  If you wear a CPAP at night please bring your mask, tubing, and machine the morning of surgery   Remember that you must have someone to transport you home after your surgery, and remain with you for 24 hours if you are discharged the same  day.   Please bring cases for contacts, glasses, hearing aids, dentures or bridgework because it cannot be worn into surgery.    Leave your suitcase in the car.  After surgery it may be brought to your room.  For patients admitted to the hospital, discharge time will be determined by your treatment team.  Patients discharged the day of surgery will not be allowed to drive home.    Special instructions:   [facility]- Preparing For Surgery  Before surgery, you can play an important role. Because skin is not sterile, your skin needs to be as free of germs as possible. You can reduce the number of germs on your skin by washing with CHG (chlorahexidine gluconate) Soap before surgery.  CHG is an antiseptic cleaner which kills germs and bonds with the skin to continue killing germs even after washing.    Oral Hygiene is also important to reduce your risk of infection.  Remember - BRUSH YOUR TEETH THE MORNING OF SURGERY WITH YOUR REGULAR TOOTHPASTE  Please do not use if you have an allergy to CHG or antibacterial soaps. If your skin becomes reddened/irritated stop using the CHG.  Do not shave (including legs and underarms) for at least 48 hours prior to first CHG shower. It is OK to shave your face.  Please follow these instructions carefully.   1. Shower the NIGHT BEFORE SURGERY and the MORNING OF SURGERY with CHG Soap.   2. If you chose to wash your  hair, wash your hair first as usual with your normal shampoo.  3. After you shampoo, rinse your hair and body thoroughly to remove the shampoo.  4. Use CHG as you would any other liquid soap. You can apply CHG directly to the skin and wash gently with a scrungie or a clean washcloth.   5. Apply the CHG Soap to your body ONLY FROM THE NECK DOWN.  Do not use on open wounds or open sores. Avoid contact with your eyes, ears, mouth and genitals (private parts). Wash Face and genitals (private parts)  with your normal soap.   6. Wash thoroughly,  paying special attention to the area where your surgery will be performed.  7. Thoroughly rinse your body with warm water from the neck down.  8. DO NOT shower/wash with your normal soap after using and rinsing off the CHG Soap.  9. Pat yourself dry with a CLEAN TOWEL.  10. Wear CLEAN PAJAMAS to bed the night before surgery, wear comfortable clothes the morning of surgery  11. Place CLEAN SHEETS on your bed the night of your first shower and DO NOT SLEEP WITH PETS.    Day of Surgery:  Please shower the morning of surgery with the CHG soap Do not apply any deodorants/lotions. Please wear clean clothes to the hospital/surgery center.   Remember to brush your teeth WITH YOUR REGULAR TOOTHPASTE.   Please read over the following fact sheets that you were given.

## 2019-08-22 ENCOUNTER — Encounter (HOSPITAL_COMMUNITY): Payer: Self-pay

## 2019-08-22 ENCOUNTER — Other Ambulatory Visit: Payer: Self-pay

## 2019-08-22 ENCOUNTER — Other Ambulatory Visit (HOSPITAL_COMMUNITY): Payer: 59

## 2019-08-22 ENCOUNTER — Encounter (HOSPITAL_COMMUNITY)
Admission: RE | Admit: 2019-08-22 | Discharge: 2019-08-22 | Disposition: A | Discharge status: Home / Self Care | Payer: 59 | Source: Ambulatory Visit | Attending: Specialist | Admitting: Specialist

## 2019-08-22 DIAGNOSIS — Z01812 Encounter for preprocedural laboratory examination: Secondary | ICD-10-CM | POA: Insufficient documentation

## 2019-08-22 HISTORY — DX: Gastro-esophageal reflux disease without esophagitis: K21.9

## 2019-08-22 LAB — URINALYSIS, ROUTINE W REFLEX MICROSCOPIC
Bilirubin Urine: NEGATIVE
Glucose, UA: NEGATIVE mg/dL
Hgb urine dipstick: NEGATIVE
Ketones, ur: NEGATIVE mg/dL
Leukocytes,Ua: NEGATIVE
Nitrite: NEGATIVE
Protein, ur: NEGATIVE mg/dL
Specific Gravity, Urine: 1.008 (ref 1.005–1.030)
pH: 7 (ref 5.0–8.0)

## 2019-08-22 LAB — SURGICAL PCR SCREEN
MRSA, PCR: NEGATIVE
Staphylococcus aureus: NEGATIVE

## 2019-08-22 LAB — COMPREHENSIVE METABOLIC PANEL
ALT: 24 U/L (ref 0–44)
AST: 24 U/L (ref 15–41)
Albumin: 4.2 g/dL (ref 3.5–5.0)
Alkaline Phosphatase: 83 U/L (ref 38–126)
Anion gap: 7 (ref 5–15)
BUN: 16 mg/dL (ref 6–20)
CO2: 23 mmol/L (ref 22–32)
Calcium: 9.4 mg/dL (ref 8.9–10.3)
Chloride: 108 mmol/L (ref 98–111)
Creatinine, Ser: 1.09 mg/dL — ABNORMAL HIGH (ref 0.44–1.00)
GFR calc Af Amer: >60 mL/min (ref >60)
GFR calc non Af Amer: 60 mL/min — ABNORMAL LOW (ref >60)
Glucose, Bld: 95 mg/dL (ref 70–99)
Potassium: 3.8 mmol/L (ref 3.5–5.1)
Sodium: 138 mmol/L (ref 135–145)
Total Bilirubin: 0.5 mg/dL (ref 0.3–1.2)
Total Protein: 8 g/dL (ref 6.5–8.1)

## 2019-08-22 LAB — CBC
HCT: 43.3 % (ref 36.0–46.0)
Hemoglobin: 14.1 g/dL (ref 12.0–15.0)
MCH: 32.9 pg (ref 26.0–34.0)
MCHC: 32.6 g/dL (ref 30.0–36.0)
MCV: 101.2 fL — ABNORMAL HIGH (ref 80.0–100.0)
Platelets: 328 10*3/uL (ref 150–400)
RBC: 4.28 MIL/uL (ref 3.87–5.11)
RDW: 12.2 % (ref 11.5–15.5)
WBC: 4.8 10*3/uL (ref 4.0–10.5)
nRBC: 0 % (ref 0.0–0.2)

## 2019-08-22 LAB — PROTIME-INR
INR: 1 (ref 0.8–1.2)
Prothrombin Time: 12.5 seconds (ref 11.4–15.2)

## 2019-08-22 NOTE — Progress Notes (Signed)
PCP - [facility] Cardiologist - Dr. Ida Rogue  Chest x-ray - n/a EKG - 08-22-19 ECHO - 08-15-18  SA - yes, but pt stated she does not wear CPAP, per physician's recommendations  ERAS Protcol - yes, Ensure ordered and given  COVID TEST- Monday, December 28th   Anesthesia review: n/a  Patient denies shortness of breath, fever, cough and chest pain at PAT appointment   All instructions explained to the patient, with a verbal understanding of the material. Patient agrees to go over the instructions while at home for a better understanding. Patient also instructed to self quarantine after being tested for COVID-19. The opportunity to ask questions was provided.

## 2019-08-25 ENCOUNTER — Other Ambulatory Visit: Payer: Self-pay

## 2019-08-27 ENCOUNTER — Ambulatory Visit: Payer: 59 | Admitting: Surgery

## 2019-08-27 ENCOUNTER — Encounter: Payer: Self-pay | Admitting: Surgery

## 2019-08-27 ENCOUNTER — Other Ambulatory Visit: Payer: Self-pay

## 2019-08-27 VITALS — BP 121/73 | HR 63 | Ht 64.0 in | Wt 266.0 lb

## 2019-08-27 DIAGNOSIS — M5136 Other intervertebral disc degeneration, lumbar region: Secondary | ICD-10-CM

## 2019-08-27 NOTE — Progress Notes (Signed)
48 year old white female with history of L2-L5 HNP/stenosis comes in for preop evaluation.  Symptoms unchanged from previous visit.  She is wanting to proceed with L2-L5 fusion as scheduled.  States that she has been given preop cardiac clearance by Dr. Rockey Situ.  Surgical procedure along potential rehab/recovery time discussed.  All questions answered and she wishes to proceed.

## 2019-09-01 ENCOUNTER — Other Ambulatory Visit
Admission: RE | Admit: 2019-09-01 | Discharge: 2019-09-01 | Disposition: A | Discharge status: Home / Self Care | Payer: 59 | Source: Ambulatory Visit | Attending: Specialist | Admitting: Specialist

## 2019-09-01 ENCOUNTER — Other Ambulatory Visit: Payer: Self-pay

## 2019-09-01 DIAGNOSIS — Z20828 Contact with and (suspected) exposure to other viral communicable diseases: Secondary | ICD-10-CM | POA: Diagnosis not present

## 2019-09-01 DIAGNOSIS — Z01812 Encounter for preprocedural laboratory examination: Secondary | ICD-10-CM | POA: Insufficient documentation

## 2019-09-01 MED ORDER — DEXTROSE 5 % IV SOLN
3.0000 g | INTRAVENOUS | Status: AC
  Administered 2019-09-02 (×2): 3 g via INTRAVENOUS
  Filled 2019-09-01: qty 3

## 2019-09-01 MED ORDER — BUPIVACAINE LIPOSOME 1.3 % IJ SUSP
20.0000 mL | Freq: Once | INTRAMUSCULAR | Status: AC
  Administered 2019-09-02: 15 mL
  Filled 2019-09-01: qty 20

## 2019-09-01 NOTE — H&P (Signed)
Vanessa Medina is an 48 y.o. female.   Chief Complaint: Back pain and left lower extremity radiculopathy HPI: 48 year old white female with history of L2-L5 HNP/stenosis comes in for preop evaluation.  Symptoms unchanged from previous visit.  She is wanting to proceed with L2-L5 fusion as scheduled.  States that she has been given preop cardiac clearance by Dr. Rockey Situ.   Past Medical History:  Diagnosis Date  . Anemia   . Cervical cancer ([place])   . Family history of adverse reaction to anesthesia     " my mother takes a long time time wake up"  . GERD (gastroesophageal reflux disease)   . Headache    migraine  . History of shingles   . Hypertension   . Migraine   . Multiple allergies   . Obesity   . Osteoarthritis    left hip  . PONV (postoperative nausea and vomiting)   . Sleep apnea    does not wear CPAP  . Wears glasses     Past Surgical History:  Procedure Laterality Date  . ABDOMINAL HYSTERECTOMY    . APPENDECTOMY    . BILIOPANCREATIC DIVERSION     with duodenal switch laparoscopic   . CHOLECYSTECTOMY    . DIAGNOSTIC LAPAROSCOPY     LOA  . ESOPHAGOGASTRODUODENOSCOPY (EGD) WITH PROPOFOL N/A 07/25/2017   Procedure: ESOPHAGOGASTRODUODENOSCOPY (EGD) WITH PROPOFOL;  Surgeon: Manya Silvas, MD;  [facility];  Service: Endoscopy;  Laterality: N/A;  . KNEE ARTHROSCOPY    . LAPAROSCOPIC GASTRIC SLEEVE RESECTION WITH HIATAL HERNIA REPAIR    . TONSILLECTOMY    . TOTAL HIP ARTHROPLASTY Left 01/26/2016   Procedure: LEFT TOTAL HIP ARTHROPLASTY ANTERIOR APPROACH;  Surgeon: Leandrew Koyanagi, MD;  Location: [facility];  Service: Orthopedics;  Laterality: Left;    Family History  Problem Relation Age of Onset  . Renal Disease Mother   . Hypertension Mother   . Sudden Cardiac Death Mother   . Heart failure Mother   . Valvular heart disease Mother   . Heart disease Mother   . Stroke Brother   . Heart attack Brother 44  . Diabetes Other   . Breast cancer Cousin         paternal side   Social History:  reports that she has never smoked. She has never used smokeless tobacco. She reports current alcohol use. She reports that she does not use drugs.  Allergies:  Allergies  Allergen Reactions  . Shellfish Allergy Anaphylaxis  . Meperidine Hcl Other (See Comments)    BRADYCARDIA  . Morphine Other (See Comments)    BRADYCARDIA  . Bee Pollen Other (See Comments)    Unknown  . Ivp Dye [Iodinated Diagnostic Agents] Other (See Comments)    Swelling   . Codeine Nausea And Vomiting  . Oxycodone-Acetaminophen Itching  . Pentazocine Lactate Nausea And Vomiting    REACTION: vomiting with Talwin NX  . Propoxyphene N-Acetaminophen Nausea And Vomiting    No medications prior to admission.    No results found for this or any previous visit (from the past 48 hour(s)). No results found.  Review of Systems  Constitutional: Positive for activity change.  HENT: Negative.   Respiratory: Negative.   Cardiovascular: Negative.   Gastrointestinal: Negative.   Genitourinary: Negative.   Musculoskeletal: Positive for back pain.  Skin: Negative.   Neurological: Positive for numbness.  Psychiatric/Behavioral: Negative.     There were no vitals taken for this visit. Physical Exam  Constitutional: She  is oriented to person, place, and time. She appears well-developed. No distress.  HENT:  Head: Normocephalic.  Eyes: Pupils are equal, round, and reactive to light. EOM are normal.  Cardiovascular: Normal heart sounds.  Respiratory: Effort normal and breath sounds normal. No respiratory distress. She has no wheezes.  GI: Bowel sounds are normal. She exhibits no distension. There is no abdominal tenderness.  Musculoskeletal:        General: Tenderness present.     Cervical back: Normal range of motion.  Neurological: She is alert and oriented to person, place, and time.  Skin: Skin is warm and dry.  Psychiatric: She has a normal mood and affect.      Assessment/Plan L2-3, L3-4, L4-5 HNP/stenosis.  Low back pain and left lower extremity radiculopathy  We will proceed withTRANSFORAMINAL LUMBAR INTERBODY FUSION LEFT L2-3, L3-4, L4-5 WITH PEDICLE SCREWS, RODS, CAGES, LOCAL BONE GRAFT, ALLOGRAFT BONE GRAFT, VIVIGEN as scheduled.  Surgical procedure along with potential rehab/recovery time discussed.  All questions answered.  Benjiman Core, PA-C 09/01/2019, 3:23 PM

## 2019-09-01 NOTE — Anesthesia Preprocedure Evaluation (Addendum)
Anesthesia Evaluation  Patient identified by MRN, date of birth, ID band Patient awake    Reviewed: Allergy & Precautions, NPO status , Patient's Chart, lab work & pertinent test results  History of Anesthesia Complications (+) PONV and history of anesthetic complications  Airway Mallampati: II  TM Distance: >3 FB Neck ROM: Full    Dental no notable dental hx. (+) Dental Advisory Given   Pulmonary sleep apnea ,    Pulmonary exam normal        Cardiovascular hypertension, Normal cardiovascular exam  Postural hypotension   Neuro/Psych PSYCHIATRIC DISORDERS Depression    GI/Hepatic Neg liver ROS, GERD  ,  Endo/Other  Morbid obesity  Renal/GU negative Renal ROS     Musculoskeletal negative musculoskeletal ROS (+)   Abdominal   Peds  Hematology negative hematology ROS (+)   Anesthesia Other Findings Day of surgery medications reviewed with the patient.  Reproductive/Obstetrics                           Anesthesia Physical Anesthesia Plan  ASA: III  Anesthesia Plan:    Post-op Pain Management:    Induction: Intravenous  PONV Risk Score and Plan: 4 or greater and Ondansetron, Dexamethasone, Midazolam and Scopolamine patch - Pre-op  Airway Management Planned: Oral ETT  Additional Equipment:   Intra-op Plan:   Post-operative Plan: Extubation in OR  Informed Consent: I have reviewed the patients History and Physical, chart, labs and discussed the procedure including the risks, benefits and alternatives for the proposed anesthesia with the patient or authorized representative who has indicated his/her understanding and acceptance.     Dental advisory given  Plan Discussed with: CRNA, Anesthesiologist and Surgeon  Anesthesia Plan Comments: (Cardiac clearance per telephone encounter 08/05/19, "EMMERSEN GARRAWAY was last seen on 02/07/2019 via telemedicine by Dr. Rockey Situ.  Since that day,  TARENA GOCKLEY has done well. She is seen in our office for low blood pressure. She has had a normal echocardiogram. She is doing very well now on midodrine. Therefore, based on ACC/AHA guidelines, the patient would be at acceptable risk for the planned procedure without further cardiovascular testing."  proep labs reviewed, WNL   EKG 08/22/19: NSR. Rate 65.  Echocardiogram 08/14/18: - Left ventricle: The cavity size was normal. Wall thickness was increased in a pattern of mild LVH. Systolic function was normal. The estimated ejection fraction was in the range of 55% to 60%. Wall motion was normal; there were no regional wall motion abnormalities. Left ventricular diastolic function parameters were normal. - Right ventricle: The cavity size was normal. Wall thickness was normal. Systolic function was normal. )      Anesthesia Quick Evaluation

## 2019-09-01 NOTE — Progress Notes (Signed)
Anesthesia Chart Review:  Cardiac clearance per telephone encounter 08/05/19, "Vanessa Medina was last seen on 02/07/2019 via telemedicine by Dr. Rockey Situ.  Since that day, Vanessa Medina has done well. She is seen in our office for low blood pressure. She has had a normal echocardiogram. She is doing very well now on midodrine. Therefore, based on ACC/AHA guidelines, the patient would be at acceptable risk for the planned procedure without further cardiovascular testing."  proep labs reviewed, WNL   EKG 08/22/19: NSR. Rate 65.  Echocardiogram 08/14/18: - Left ventricle: The cavity size was normal. Wall thickness was increased in a pattern of mild LVH. Systolic function was normal. The estimated ejection fraction was in the range of 55% to 60%. Wall motion was normal; there were no regional wall motion abnormalities. Left ventricular diastolic function parameters were normal. - Right ventricle: The cavity size was normal. Wall thickness was normal. Systolic function was normal.    [place]essa Medina [facility]Anesthesiology Phone [phone] 09/01/2019 2:52 PM

## 2019-09-02 ENCOUNTER — Inpatient Hospital Stay (HOSPITAL_COMMUNITY)
Admission: RE | Admit: 2019-09-02 | Discharge: 2019-09-08 | DRG: 454 | Disposition: A | Discharge status: Home Health | Payer: 59 | Attending: Specialist | Admitting: Specialist

## 2019-09-02 ENCOUNTER — Inpatient Hospital Stay (HOSPITAL_COMMUNITY): Payer: 59

## 2019-09-02 ENCOUNTER — Other Ambulatory Visit: Payer: Self-pay

## 2019-09-02 ENCOUNTER — Inpatient Hospital Stay (HOSPITAL_COMMUNITY): Payer: 59 | Admitting: Vascular Surgery

## 2019-09-02 ENCOUNTER — Inpatient Hospital Stay (HOSPITAL_COMMUNITY)
Admission: RE | Disposition: A | Discharge status: Home Health | Payer: Self-pay | Source: Home / Self Care | Attending: Specialist

## 2019-09-02 ENCOUNTER — Encounter (HOSPITAL_COMMUNITY): Payer: Self-pay | Admitting: Specialist

## 2019-09-02 ENCOUNTER — Inpatient Hospital Stay (HOSPITAL_COMMUNITY): Payer: 59 | Admitting: Anesthesiology

## 2019-09-02 DIAGNOSIS — Z8541 Personal history of malignant neoplasm of cervix uteri: Secondary | ICD-10-CM

## 2019-09-02 DIAGNOSIS — M51369 Other intervertebral disc degeneration, lumbar region without mention of lumbar back pain or lower extremity pain: Secondary | ICD-10-CM | POA: Diagnosis present

## 2019-09-02 DIAGNOSIS — Z20822 Contact with and (suspected) exposure to covid-19: Secondary | ICD-10-CM | POA: Diagnosis present

## 2019-09-02 DIAGNOSIS — Z7289 Other problems related to lifestyle: Secondary | ICD-10-CM

## 2019-09-02 DIAGNOSIS — Z96642 Presence of left artificial hip joint: Secondary | ICD-10-CM | POA: Diagnosis present

## 2019-09-02 DIAGNOSIS — Z91013 Allergy to seafood: Secondary | ICD-10-CM

## 2019-09-02 DIAGNOSIS — Z9049 Acquired absence of other specified parts of digestive tract: Secondary | ICD-10-CM

## 2019-09-02 DIAGNOSIS — D62 Acute posthemorrhagic anemia: Secondary | ICD-10-CM | POA: Diagnosis not present

## 2019-09-02 DIAGNOSIS — M48061 Spinal stenosis, lumbar region without neurogenic claudication: Secondary | ICD-10-CM | POA: Diagnosis present

## 2019-09-02 DIAGNOSIS — I10 Essential (primary) hypertension: Secondary | ICD-10-CM | POA: Diagnosis present

## 2019-09-02 DIAGNOSIS — Z6841 Body Mass Index (BMI) 40.0 and over, adult: Secondary | ICD-10-CM

## 2019-09-02 DIAGNOSIS — F329 Major depressive disorder, single episode, unspecified: Secondary | ICD-10-CM | POA: Diagnosis present

## 2019-09-02 DIAGNOSIS — Z90710 Acquired absence of both cervix and uterus: Secondary | ICD-10-CM | POA: Diagnosis not present

## 2019-09-02 DIAGNOSIS — G43909 Migraine, unspecified, not intractable, without status migrainosus: Secondary | ICD-10-CM | POA: Diagnosis not present

## 2019-09-02 DIAGNOSIS — Z8249 Family history of ischemic heart disease and other diseases of the circulatory system: Secondary | ICD-10-CM

## 2019-09-02 DIAGNOSIS — M5116 Intervertebral disc disorders with radiculopathy, lumbar region: Secondary | ICD-10-CM | POA: Diagnosis present

## 2019-09-02 DIAGNOSIS — Z419 Encounter for procedure for purposes other than remedying health state, unspecified: Secondary | ICD-10-CM

## 2019-09-02 DIAGNOSIS — G4730 Sleep apnea, unspecified: Secondary | ICD-10-CM | POA: Diagnosis present

## 2019-09-02 DIAGNOSIS — Z885 Allergy status to narcotic agent status: Secondary | ICD-10-CM | POA: Diagnosis not present

## 2019-09-02 DIAGNOSIS — Z888 Allergy status to other drugs, medicaments and biological substances status: Secondary | ICD-10-CM

## 2019-09-02 DIAGNOSIS — K219 Gastro-esophageal reflux disease without esophagitis: Secondary | ICD-10-CM | POA: Diagnosis present

## 2019-09-02 DIAGNOSIS — Z9884 Bariatric surgery status: Secondary | ICD-10-CM

## 2019-09-02 DIAGNOSIS — M5136 Other intervertebral disc degeneration, lumbar region: Secondary | ICD-10-CM | POA: Diagnosis present

## 2019-09-02 DIAGNOSIS — Z91030 Bee allergy status: Secondary | ICD-10-CM | POA: Diagnosis not present

## 2019-09-02 DIAGNOSIS — Z91041 Radiographic dye allergy status: Secondary | ICD-10-CM | POA: Diagnosis not present

## 2019-09-02 LAB — TYPE AND SCREEN
ABO/RH(D): A NEG
Antibody Screen: NEGATIVE

## 2019-09-02 LAB — SARS CORONAVIRUS 2 (TAT 6-24 HRS): SARS Coronavirus 2: NEGATIVE

## 2019-09-02 SURGERY — POSTERIOR LUMBAR FUSION 3 LEVEL
Anesthesia: General | Site: Spine Lumbar

## 2019-09-02 MED ORDER — METHOCARBAMOL 500 MG PO TABS
500.0000 mg | ORAL_TABLET | Freq: Four times a day (QID) | ORAL | Status: DC | PRN
  Administered 2019-09-02 – 2019-09-05 (×2): 500 mg via ORAL
  Filled 2019-09-02: qty 1

## 2019-09-02 MED ORDER — DIPHENHYDRAMINE HCL 50 MG/ML IJ SOLN
INTRAMUSCULAR | Status: DC | PRN
  Administered 2019-09-02: 12.5 mg via INTRAVENOUS

## 2019-09-02 MED ORDER — HEMOSTATIC AGENTS (NO CHARGE) OPTIME
TOPICAL | Status: DC | PRN
  Administered 2019-09-02: 1

## 2019-09-02 MED ORDER — LIDOCAINE 2% (20 MG/ML) 5 ML SYRINGE
INTRAMUSCULAR | Status: DC | PRN
  Administered 2019-09-02: 100 mg via INTRAVENOUS

## 2019-09-02 MED ORDER — SODIUM CHLORIDE 0.9 % IV SOLN: INTRAVENOUS | Status: DC | PRN

## 2019-09-02 MED ORDER — SODIUM CHLORIDE 0.9 % IV SOLN: 250.0000 mL | INTRAVENOUS | Status: DC

## 2019-09-02 MED ORDER — CEFAZOLIN SODIUM 1 G IJ SOLR
INTRAMUSCULAR | Status: AC
  Filled 2019-09-02: qty 30

## 2019-09-02 MED ORDER — CEFAZOLIN SODIUM-DEXTROSE 2-4 GM/100ML-% IV SOLN
2.0000 g | Freq: Three times a day (TID) | INTRAVENOUS | Status: AC
  Administered 2019-09-02 – 2019-09-03 (×2): 2 g via INTRAVENOUS
  Filled 2019-09-02 (×2): qty 100

## 2019-09-02 MED ORDER — 0.9 % SODIUM CHLORIDE (POUR BTL) OPTIME
TOPICAL | Status: DC | PRN
  Administered 2019-09-02 (×2): 1000 mL

## 2019-09-02 MED ORDER — LIDOCAINE 2% (20 MG/ML) 5 ML SYRINGE
INTRAMUSCULAR | Status: AC
  Filled 2019-09-02: qty 5

## 2019-09-02 MED ORDER — PROPOFOL 10 MG/ML IV BOLUS
INTRAVENOUS | Status: DC | PRN
  Administered 2019-09-02: 150 mg via INTRAVENOUS

## 2019-09-02 MED ORDER — FENTANYL CITRATE (PF) 250 MCG/5ML IJ SOLN
INTRAMUSCULAR | Status: AC
  Filled 2019-09-02: qty 5

## 2019-09-02 MED ORDER — DEXAMETHASONE SODIUM PHOSPHATE 10 MG/ML IJ SOLN
INTRAMUSCULAR | Status: DC | PRN
  Administered 2019-09-02: 10 mg via INTRAVENOUS

## 2019-09-02 MED ORDER — ONDANSETRON HCL 4 MG PO TABS
4.0000 mg | ORAL_TABLET | Freq: Four times a day (QID) | ORAL | Status: DC | PRN
  Administered 2019-09-04 – 2019-09-06 (×3): 4 mg via ORAL
  Filled 2019-09-02 (×3): qty 1

## 2019-09-02 MED ORDER — METHOCARBAMOL 1000 MG/10ML IJ SOLN
500.0000 mg | Freq: Four times a day (QID) | INTRAVENOUS | Status: DC | PRN
  Filled 2019-09-02: qty 5

## 2019-09-02 MED ORDER — ADULT MULTIVITAMIN W/MINERALS CH
1.0000 | ORAL_TABLET | Freq: Every day | ORAL | Status: DC
  Administered 2019-09-03 – 2019-09-08 (×6): 1 via ORAL
  Filled 2019-09-02 (×6): qty 1

## 2019-09-02 MED ORDER — ALUM & MAG HYDROXIDE-SIMETH 200-200-20 MG/5ML PO SUSP: 30.0000 mL | Freq: Four times a day (QID) | ORAL | Status: DC | PRN

## 2019-09-02 MED ORDER — ONDANSETRON HCL 4 MG/2ML IJ SOLN
INTRAMUSCULAR | Status: AC
  Filled 2019-09-02: qty 2

## 2019-09-02 MED ORDER — FENTANYL CITRATE (PF) 100 MCG/2ML IJ SOLN
INTRAMUSCULAR | Status: DC | PRN
  Administered 2019-09-02: 100 ug via INTRAVENOUS
  Administered 2019-09-02 (×8): 50 ug via INTRAVENOUS
  Administered 2019-09-02: 100 ug via INTRAVENOUS
  Administered 2019-09-02 (×3): 50 ug via INTRAVENOUS

## 2019-09-02 MED ORDER — DOCUSATE SODIUM 100 MG PO CAPS
100.0000 mg | ORAL_CAPSULE | Freq: Two times a day (BID) | ORAL | Status: DC
  Administered 2019-09-02 – 2019-09-08 (×10): 100 mg via ORAL
  Filled 2019-09-02 (×12): qty 1

## 2019-09-02 MED ORDER — ALBUMIN HUMAN 5 % IV SOLN: INTRAVENOUS | Status: DC | PRN

## 2019-09-02 MED ORDER — FENTANYL CITRATE (PF) 100 MCG/2ML IJ SOLN
25.0000 ug | INTRAMUSCULAR | Status: DC | PRN
  Administered 2019-09-02 (×2): 50 ug via INTRAVENOUS

## 2019-09-02 MED ORDER — TOPIRAMATE 25 MG PO TABS
25.0000 mg | ORAL_TABLET | Freq: Two times a day (BID) | ORAL | Status: DC
  Administered 2019-09-02 – 2019-09-08 (×12): 25 mg via ORAL
  Filled 2019-09-02 (×11): qty 1

## 2019-09-02 MED ORDER — SUGAMMADEX SODIUM 500 MG/5ML IV SOLN
INTRAVENOUS | Status: AC
  Filled 2019-09-02: qty 5

## 2019-09-02 MED ORDER — SODIUM CHLORIDE 0.9% FLUSH
3.0000 mL | INTRAVENOUS | Status: DC | PRN
  Administered 2019-09-02 – 2019-09-07 (×3): 3 mL via INTRAVENOUS

## 2019-09-02 MED ORDER — BUPIVACAINE HCL (PF) 0.5 % IJ SOLN
INTRAMUSCULAR | Status: AC
  Filled 2019-09-02: qty 30

## 2019-09-02 MED ORDER — DEXAMETHASONE SODIUM PHOSPHATE 10 MG/ML IJ SOLN
INTRAMUSCULAR | Status: AC
  Filled 2019-09-02: qty 1

## 2019-09-02 MED ORDER — DIPHENHYDRAMINE HCL 50 MG/ML IJ SOLN
INTRAMUSCULAR | Status: AC
  Filled 2019-09-02: qty 1

## 2019-09-02 MED ORDER — PROPOFOL 10 MG/ML IV BOLUS
INTRAVENOUS | Status: AC
  Filled 2019-09-02: qty 20

## 2019-09-02 MED ORDER — MIDAZOLAM HCL 5 MG/5ML IJ SOLN
INTRAMUSCULAR | Status: DC | PRN
  Administered 2019-09-02: 2 mg via INTRAVENOUS

## 2019-09-02 MED ORDER — CELECOXIB 200 MG PO CAPS
400.0000 mg | ORAL_CAPSULE | Freq: Once | ORAL | Status: AC
  Administered 2019-09-02: 400 mg via ORAL
  Filled 2019-09-02: qty 2

## 2019-09-02 MED ORDER — EPHEDRINE SULFATE 50 MG/ML IJ SOLN
INTRAMUSCULAR | Status: DC | PRN
  Administered 2019-09-02: 10 mg via INTRAVENOUS

## 2019-09-02 MED ORDER — PHENYLEPHRINE 40 MCG/ML (10ML) SYRINGE FOR IV PUSH (FOR BLOOD PRESSURE SUPPORT)
PREFILLED_SYRINGE | INTRAVENOUS | Status: AC
  Filled 2019-09-02: qty 10

## 2019-09-02 MED ORDER — MORPHINE SULFATE (PF) 2 MG/ML IV SOLN: 1.0000 mg | INTRAVENOUS | Status: DC | PRN

## 2019-09-02 MED ORDER — ROCURONIUM BROMIDE 10 MG/ML (PF) SYRINGE
PREFILLED_SYRINGE | INTRAVENOUS | Status: AC
  Filled 2019-09-02: qty 30

## 2019-09-02 MED ORDER — PROMETHAZINE HCL 25 MG/ML IJ SOLN: 6.2500 mg | INTRAMUSCULAR | Status: DC | PRN

## 2019-09-02 MED ORDER — BUPIVACAINE HCL 0.5 % IJ SOLN
INTRAMUSCULAR | Status: DC | PRN
  Administered 2019-09-02: 15 mL

## 2019-09-02 MED ORDER — DEXMEDETOMIDINE HCL 200 MCG/2ML IV SOLN
INTRAVENOUS | Status: DC | PRN
  Administered 2019-09-02: 4 ug via INTRAVENOUS

## 2019-09-02 MED ORDER — SODIUM CHLORIDE 0.9% FLUSH
3.0000 mL | Freq: Two times a day (BID) | INTRAVENOUS | Status: DC
  Administered 2019-09-02 – 2019-09-08 (×9): 3 mL via INTRAVENOUS

## 2019-09-02 MED ORDER — GABAPENTIN 300 MG PO CAPS
300.0000 mg | ORAL_CAPSULE | Freq: Every day | ORAL | Status: DC
  Administered 2019-09-02 – 2019-09-07 (×6): 300 mg via ORAL
  Filled 2019-09-02 (×6): qty 1

## 2019-09-02 MED ORDER — OXYCODONE HCL 5 MG PO TABS: 5.0000 mg | ORAL_TABLET | ORAL | Status: DC | PRN

## 2019-09-02 MED ORDER — LACTATED RINGERS IV SOLN: INTRAVENOUS | Status: DC

## 2019-09-02 MED ORDER — FLEET ENEMA 7-19 GM/118ML RE ENEM: 1.0000 | ENEMA | Freq: Once | RECTAL | Status: DC | PRN

## 2019-09-02 MED ORDER — ACETAMINOPHEN 650 MG RE SUPP: 650.0000 mg | RECTAL | Status: DC | PRN

## 2019-09-02 MED ORDER — ONDANSETRON HCL 4 MG/2ML IJ SOLN
INTRAMUSCULAR | Status: DC | PRN
  Administered 2019-09-02 (×2): 4 mg via INTRAVENOUS

## 2019-09-02 MED ORDER — PHENYLEPHRINE 40 MCG/ML (10ML) SYRINGE FOR IV PUSH (FOR BLOOD PRESSURE SUPPORT)
PREFILLED_SYRINGE | INTRAVENOUS | Status: DC | PRN
  Administered 2019-09-02: 80 ug via INTRAVENOUS

## 2019-09-02 MED ORDER — OXYCODONE HCL ER 10 MG PO T12A
10.0000 mg | EXTENDED_RELEASE_TABLET | Freq: Two times a day (BID) | ORAL | Status: DC
  Administered 2019-09-02 – 2019-09-08 (×12): 10 mg via ORAL
  Filled 2019-09-02 (×12): qty 1

## 2019-09-02 MED ORDER — SUGAMMADEX SODIUM 200 MG/2ML IV SOLN
INTRAVENOUS | Status: DC | PRN
  Administered 2019-09-02: 250 mg via INTRAVENOUS

## 2019-09-02 MED ORDER — MIDAZOLAM HCL 2 MG/2ML IJ SOLN
INTRAMUSCULAR | Status: AC
  Filled 2019-09-02: qty 2

## 2019-09-02 MED ORDER — ACETAMINOPHEN 500 MG PO TABS
1000.0000 mg | ORAL_TABLET | Freq: Once | ORAL | Status: AC
  Administered 2019-09-02: 1000 mg via ORAL
  Filled 2019-09-02: qty 2

## 2019-09-02 MED ORDER — LACTATED RINGERS IV SOLN: INTRAVENOUS | Status: DC | PRN

## 2019-09-02 MED ORDER — METHOCARBAMOL 500 MG PO TABS
ORAL_TABLET | ORAL | Status: AC
  Filled 2019-09-02: qty 1

## 2019-09-02 MED ORDER — MENTHOL 3 MG MT LOZG: 1.0000 | LOZENGE | OROMUCOSAL | Status: DC | PRN

## 2019-09-02 MED ORDER — PHENOL 1.4 % MT LIQD: 1.0000 | OROMUCOSAL | Status: DC | PRN

## 2019-09-02 MED ORDER — PRAMIPEXOLE DIHYDROCHLORIDE 0.25 MG PO TABS
0.5000 mg | ORAL_TABLET | Freq: Every day | ORAL | Status: DC
  Administered 2019-09-02 – 2019-09-07 (×6): 0.5 mg via ORAL
  Filled 2019-09-02 (×7): qty 2

## 2019-09-02 MED ORDER — OXYCODONE HCL 5 MG PO TABS
ORAL_TABLET | ORAL | Status: AC
  Filled 2019-09-02: qty 2

## 2019-09-02 MED ORDER — BISACODYL 5 MG PO TBEC
5.0000 mg | DELAYED_RELEASE_TABLET | Freq: Every day | ORAL | Status: DC | PRN
  Administered 2019-09-06: 5 mg via ORAL
  Filled 2019-09-02: qty 1

## 2019-09-02 MED ORDER — SCOPOLAMINE 1 MG/3DAYS TD PT72
1.0000 | MEDICATED_PATCH | TRANSDERMAL | Status: DC
  Administered 2019-09-02: 1.5 mg via TRANSDERMAL
  Filled 2019-09-02: qty 1

## 2019-09-02 MED ORDER — ACETAMINOPHEN 325 MG PO TABS
ORAL_TABLET | ORAL | Status: AC
  Filled 2019-09-02: qty 2

## 2019-09-02 MED ORDER — ONDANSETRON HCL 4 MG/2ML IJ SOLN
4.0000 mg | Freq: Four times a day (QID) | INTRAMUSCULAR | Status: DC | PRN
  Filled 2019-09-02: qty 2

## 2019-09-02 MED ORDER — ACETAMINOPHEN 325 MG PO TABS
650.0000 mg | ORAL_TABLET | ORAL | Status: DC | PRN
  Administered 2019-09-02 – 2019-09-04 (×3): 650 mg via ORAL
  Filled 2019-09-02 (×2): qty 2

## 2019-09-02 MED ORDER — ROCURONIUM BROMIDE 50 MG/5ML IV SOSY
PREFILLED_SYRINGE | INTRAVENOUS | Status: DC | PRN
  Administered 2019-09-02: 100 mg via INTRAVENOUS
  Administered 2019-09-02 (×2): 20 mg via INTRAVENOUS

## 2019-09-02 MED ORDER — PANTOPRAZOLE SODIUM 40 MG IV SOLR
40.0000 mg | Freq: Every day | INTRAVENOUS | Status: DC
  Administered 2019-09-02 – 2019-09-03 (×2): 40 mg via INTRAVENOUS
  Filled 2019-09-02 (×2): qty 40

## 2019-09-02 MED ORDER — MIDODRINE HCL 5 MG PO TABS
10.0000 mg | ORAL_TABLET | Freq: Three times a day (TID) | ORAL | Status: DC
  Administered 2019-09-02 – 2019-09-08 (×17): 10 mg via ORAL
  Filled 2019-09-02 (×21): qty 2

## 2019-09-02 MED ORDER — POLYETHYLENE GLYCOL 3350 17 G PO PACK: 17.0000 g | PACK | Freq: Every day | ORAL | Status: DC | PRN

## 2019-09-02 MED ORDER — THROMBIN 20000 UNITS EX SOLR
CUTANEOUS | Status: DC | PRN
  Administered 2019-09-02: 20 mL

## 2019-09-02 MED ORDER — PHENYLEPHRINE HCL-NACL 10-0.9 MG/250ML-% IV SOLN
INTRAVENOUS | Status: DC | PRN
  Administered 2019-09-02: 25 ug/min via INTRAVENOUS

## 2019-09-02 MED ORDER — SODIUM CHLORIDE 0.9 % IV SOLN: INTRAVENOUS | Status: DC

## 2019-09-02 MED ORDER — OXYCODONE HCL 5 MG PO TABS
10.0000 mg | ORAL_TABLET | ORAL | Status: DC | PRN
  Administered 2019-09-02 – 2019-09-06 (×7): 10 mg via ORAL
  Filled 2019-09-02 (×7): qty 2

## 2019-09-02 MED ORDER — THROMBIN (RECOMBINANT) 20000 UNITS EX SOLR
CUTANEOUS | Status: AC
  Filled 2019-09-02: qty 20000

## 2019-09-02 MED ORDER — FENTANYL CITRATE (PF) 100 MCG/2ML IJ SOLN
INTRAMUSCULAR | Status: AC
  Filled 2019-09-02: qty 2

## 2019-09-02 MED ORDER — CHLORHEXIDINE GLUCONATE 4 % EX LIQD: 60.0000 mL | Freq: Once | CUTANEOUS | Status: DC

## 2019-09-02 SURGICAL SUPPLY — 76 items
BLADE CLIPPER SURG (BLADE) IMPLANT
BONE CANC CHIPS 20CC PCAN1/4 (Bone Implant) ×2 IMPLANT
BONE VIVIGEN FORMABLE 10CC (Bone Implant) ×2 IMPLANT
BONE VIVIGEN FORMABLE 5.4CC (Bone Implant) ×2 IMPLANT
BUR MATCHSTICK NEURO 3.0 LAGG (BURR) ×2 IMPLANT
BUR RND FLUTED 2.5 (BURR) IMPLANT
CAGE SABLE 10X26 6-12 8D (Cage) ×2 IMPLANT
CAP LOCKING (Cap) ×8 IMPLANT
CAP LOCKING 5.5 CREO (Cap) ×8 IMPLANT
CHIPS CANC BONE 20CC PCAN1/4 (Bone Implant) ×1 IMPLANT
CONNECTOR CROSS CREO 6 32-40 L (Connector) ×2 IMPLANT
COVER BACK TABLE 80X110 HD (DRAPES) ×2 IMPLANT
COVER MAYO STAND STRL (DRAPES) IMPLANT
COVER SURGICAL LIGHT HANDLE (MISCELLANEOUS) ×2 IMPLANT
COVER WAND RF STERILE (DRAPES) ×2 IMPLANT
DERMABOND ADVANCED (GAUZE/BANDAGES/DRESSINGS) ×1
DERMABOND ADVANCED .7 DNX12 (GAUZE/BANDAGES/DRESSINGS) ×1 IMPLANT
DRAPE C-ARM 42X72 X-RAY (DRAPES) ×2 IMPLANT
DRAPE C-ARMOR (DRAPES) ×2 IMPLANT
DRAPE MICROSCOPE LEICA (MISCELLANEOUS) ×2 IMPLANT
DRAPE POUCH INSTRU U-SHP 10X18 (DRAPES) ×2 IMPLANT
DRAPE SURG 17X23 STRL (DRAPES) ×8 IMPLANT
DRSG MEPILEX BORDER 4X8 (GAUZE/BANDAGES/DRESSINGS) ×2 IMPLANT
DURAPREP 26ML APPLICATOR (WOUND CARE) ×2 IMPLANT
ELECT BLADE 4.0 EZ CLEAN MEGAD (MISCELLANEOUS) ×2
ELECT BLADE 6.5 EXT (BLADE) IMPLANT
ELECT CAUTERY BLADE 6.4 (BLADE) ×2 IMPLANT
ELECT REM PT RETURN 9FT ADLT (ELECTROSURGICAL) ×2
ELECTRODE BLDE 4.0 EZ CLN MEGD (MISCELLANEOUS) ×1 IMPLANT
ELECTRODE REM PT RTRN 9FT ADLT (ELECTROSURGICAL) ×1 IMPLANT
EVACUATOR 1/8 PVC DRAIN (DRAIN) IMPLANT
GLOVE BIOGEL PI IND STRL 8 (GLOVE) ×1 IMPLANT
GLOVE BIOGEL PI INDICATOR 8 (GLOVE) ×1
GLOVE ECLIPSE 9.0 STRL (GLOVE) ×2 IMPLANT
GLOVE ORTHO TXT STRL SZ7.5 (GLOVE) ×2 IMPLANT
GLOVE SURG 8.5 LATEX PF (GLOVE) ×2 IMPLANT
GOWN STRL REUS W/ TWL LRG LVL3 (GOWN DISPOSABLE) ×1 IMPLANT
GOWN STRL REUS W/TWL 2XL LVL3 (GOWN DISPOSABLE) ×4 IMPLANT
GOWN STRL REUS W/TWL LRG LVL3 (GOWN DISPOSABLE) ×1
KIT BASIN OR (CUSTOM PROCEDURE TRAY) ×2 IMPLANT
KIT POSITION SURG JACKSON T1 (MISCELLANEOUS) ×2 IMPLANT
KIT TURNOVER KIT B (KITS) ×2 IMPLANT
NEEDLE 22X1 1/2 (OR ONLY) (NEEDLE) ×2 IMPLANT
NEEDLE SPNL 18GX3.5 QUINCKE PK (NEEDLE) ×2 IMPLANT
NS IRRIG 1000ML POUR BTL (IV SOLUTION) ×2 IMPLANT
PACK LAMINECTOMY ORTHO (CUSTOM PROCEDURE TRAY) ×2 IMPLANT
PAD ARMBOARD 7.5X6 YLW CONV (MISCELLANEOUS) ×4 IMPLANT
PATTIES SURGICAL .75X.75 (GAUZE/BANDAGES/DRESSINGS) ×2 IMPLANT
PATTIES SURGICAL 1X1 (DISPOSABLE) ×2 IMPLANT
ROD SPINE CVD CREO 5.5X150 (Rod) ×4 IMPLANT
SCREW CORT CREO 6.5-5.5X45 (Screw) ×2 IMPLANT
SCREW CORT MOD CREO 7.5X45 (Screw) ×8 IMPLANT
SCREW MOD AMP CREO 6.5X45 (Screw) ×4 IMPLANT
SPACER SUSTAIN O 10X10X26 (Screw) IMPLANT
SPACER SUSTAIN O TPS 10X26 8 (Spacer) ×2 IMPLANT
SPACER TPS 10X26MM 10MM (Spacer) ×2 IMPLANT
SPONGE INTESTINAL PEANUT (DISPOSABLE) ×4 IMPLANT
SPONGE LAP 4X18 RFD (DISPOSABLE) ×6 IMPLANT
SPONGE SURGIFOAM ABS GEL 100 (HEMOSTASIS) ×2 IMPLANT
STAPLER VISISTAT 35W (STAPLE) ×2 IMPLANT
SURGIFLO W/THROMBIN 8M KIT (HEMOSTASIS) ×2 IMPLANT
SUT VIC AB 0 CT1 27 (SUTURE) ×1
SUT VIC AB 0 CT1 27XBRD ANBCTR (SUTURE) ×1 IMPLANT
SUT VIC AB 1 CTX 36 (SUTURE) ×1
SUT VIC AB 1 CTX36XBRD ANBCTR (SUTURE) ×1 IMPLANT
SUT VIC AB 2-0 CT1 27 (SUTURE) ×1
SUT VIC AB 2-0 CT1 TAPERPNT 27 (SUTURE) ×1 IMPLANT
SUT VIC AB 3-0 X1 27 (SUTURE) ×2 IMPLANT
SYR 20ML LL LF (SYRINGE) ×2 IMPLANT
SYR CONTROL 10ML LL (SYRINGE) ×6 IMPLANT
TOWEL GREEN STERILE (TOWEL DISPOSABLE) ×2 IMPLANT
TOWEL GREEN STERILE FF (TOWEL DISPOSABLE) ×2 IMPLANT
TRAY FOLEY MTR SLVR 16FR STAT (SET/KITS/TRAYS/PACK) ×2 IMPLANT
TULIP CREP AMP 5.5MM (Orthopedic Implant) ×16 IMPLANT
WATER STERILE IRR 1000ML POUR (IV SOLUTION) ×2 IMPLANT
YANKAUER SUCT BULB TIP NO VENT (SUCTIONS) ×2 IMPLANT

## 2019-09-02 NOTE — Progress Notes (Signed)
Orthopedic Tech Progress Note Patient Details:  Vanessa Medina Apr 04, 1971 [id number] Went to apply BRACE to patient but she is half way sleep and she asked if I could come back tomorrow and I said that would be ok. Even notify RN Patient ID: Vanessa Medina, female   DOB: Nov 03, 1970, 48 y.o.   MRN: [id number]   Janit Pagan 09/02/2019, 7:04 PM

## 2019-09-02 NOTE — Interval H&P Note (Signed)
History and Physical Interval Note:  09/02/2019 7:57 AM  Vanessa Medina  has presented today for surgery, with the diagnosis of lumbar degenerative disc disease L2-3, L3-4, L4-5.  The various methods of treatment have been discussed with the patient and family. After consideration of risks, benefits and other options for treatment, the patient has consented to  Procedure(s): TRANSFORAMINAL LUMBAR INTERBODY FUSION LEFT L2-3, L3-4, L4-5 WITH PEDICLE SCREWS, RODS, CAGES, LOCAL BONE GRAFT, ALLOGRAFT BONE GRAFT, VIVIGEN (N/A) as a surgical intervention.  The patient's history has been reviewed, patient examined, no change in status, stable for surgery.  I have reviewed the patient's chart and labs.  Questions were answered to the patient's satisfaction.     Basil Dess

## 2019-09-02 NOTE — Op Note (Signed)
09/02/2019  4:05 PM  PATIENT:  Vanessa Medina  48 y.o. female  MRN: [id number]  OPERATIVE REPORT  PRE-OPERATIVE DIAGNOSIS:  lumbar degenerative disc disease L2-3, L3-4, L4-5  POST-OPERATIVE DIAGNOSIS:  lumbar degenerative disc disease L2-3, L3-4, L4-5  PROCEDURE:  Procedure(s): TRANSFORAMINAL LUMBAR INTERBODY FUSION LEFT LUMBAR TWO THROUGH LUMBAR THREE, LUMBAR THREE THROUGH LUMBAR FOUR, LUMBAR FOUR THROUGH LUMBAR FIVE WITH PEDICLE SCREWS, RODS, CAGES, LOCAL BONE GRAFT, ALLOGRAFT BONE GRAFT, VIVIGEN    SURGEON:  Jessy Oto, MD     ASSISTANT:  Benjiman Core, PA-C  (Present throughout the entire procedure and necessary for completion of procedure in a timely manner)     ANESTHESIA:  General, supplemented with local marcaine 1:1 exparel 1.3% total 30 CC,  Dr. Tobias Alexander.    COMPLICATIONS:  None.   EBL: 600 CC  CELL SAVER RETURNED: 240CC    COMPONENTS:   Implant Name Type Inv. Item Serial No. Manufacturer Lot No. LRB No. Used Action  SPACER TPS 10X26MM 10MM - [id number] Spacer SPACER TPS 10X26MM 10MM  [employer] [id number] N/A 1 Implanted  BONE CANC CHIPS 20CC - [id number] Bone Implant BONE CANC CHIPS 20CC [id number] [employer] [place] [employer] [id number] N/A 1 Implanted  BONE VIVIGEN FORMABLE 10CC - [id number] Bone Implant BONE VIVIGEN FORMABLE 10CC [id number] [employer] [place] [employer] [id number] N/A 1 Implanted  BONE VIVIGEN FORMABLE 5.4CC - [id number] Bone Implant BONE VIVIGEN FORMABLE 5.4CC [id number] [employer] [place] [employer] [id number] N/A 1 Implanted  SPACER SUSTAIN O 10X26 8MM - [id number] Spacer SPACER SUSTAIN O 10X26 8MM  [employer] [id number] N/A 1 Implanted  SPACER SUSTAIN O 10X10X26 - [id number] Screw SPACER SUSTAIN O 10X10X26  [employer] [id number] N/A 1 Wasted  SABLE SPACER, 10X26, 6-12MM, 8   1172.2110S  [id number] N/A 1 Implanted  CAP LOCKING - [id number] Cap CAP LOCKING  [employer]  N/A 8 Implanted  TULIP CREP AMP 5.5MM -  [id number] Orthopedic Implant TULIP CREP AMP 5.5MM  [employer]  N/A 8 Implanted  SCREW CORT MOD CREO 7.5X45 - [id number] Screw SCREW CORT MOD CREO 7.5X45  [employer]  N/A 4 Implanted  6.5/5.5 x 45 screw   1119.1545   N/A 2 Implanted  SCREW THRD CREO 6.5X45 - [id number] Screw SCREW THRD CREO 6.5X45  [employer]  N/A 1 Implanted  ROD STRAIGHT 150MM - [id number] Rod ROD STRAIGHT 150MM  [employer]  N/A 2 Implanted  CROSSLINK 32-40   1119.0032   N/A 1 Implanted    PROCEDURE: The patient was met in the holding area, and the appropriate lumbar levels left L2-3, L3-4 and L4-5 identified and marked with an "X" and my initials. The fusion levels are identified as L2-3, L3-4 and L4-5. Patient understands the rationale to perform TLIFs at three levels to decompress the left L2-3, L3-4 and L4-5  lateral recess and foramenal stenosis and to allow for some improved correction of her overall kyphoscoliosis. The patient was then transported to OR and was placed under general anestheticwithout difficulty. The patient received appropriate preoperative antibiotic prophylaxis 2 gm Ancef..  Nursing staff inserted a Foley catheter under sterile conditions. The patient was then turned to a prone position using the [place] spine frame. PAS. all pressure points well padded the arms at the side to 90 90. Standard prep with DuraPrep solution draped in the usual manner from the lower dorsal spine the mid sacral segment. Iodine Vi-Drape was used and the old incision scar was marked. Time-out procedure was called and correct. Skin in  the midline between L2 and S1 was then infiltrated with local anesthesia, marcaine 1/2% 1:1 exparel 1.3% total 20 cc used. Incision was then made  extending from L2-S1  through the skin and subcutaneous layers down to the patient's lumbodorsal fascia and spinous processes. The incision then carried sharply excising the supraspinous ligament and then continuing the lateral aspect of the spinous  processes of L2, L3, L4 and L5. Cobb elevator used to carefully elevate the paralumbar muscles off of the posterior elements using electrocautery carefully drilled bleeding and perform dissection of the muscle tissues of the preserving the facet capsule at the L2-3. Continuing the exposure out laterally to expose the lateral margin of the facet joint line at L2-3, L3-4 and L4-5 . Incision was carried in the midline down to the S1 level area bleeders controlled using electrocautery monopolar electrocautery.  Clamps were then placed at the  L4-5 interspinous process level observed on lateral radiograph the upper clamp was beneath the L4 spinous process. Spinous processes of L4 and L3 marked with  OR marking pen sterilely Viper self retaining retractor was used for the upper part of the incision and the cerebellar retractor distally. C-arm fluoroscopy was then brought into the field and using C-arm fluoroscopy then a hole made into the medial aspect of the pedicle of L2 observed in the pedicle using C arm at the 5 oclock position on the left L2 pedicle nerve probe initial entry was determined on fluoroscopy to be good position alignment so that a 4.34m tap was passed to 30 mm within the left L2 pedicle to a depth of nearly 45 mm observed on C-arm fluoroscopy to be beyond the midpoint of the lumbar vertebra and then position alignment within the left L2 pedicle this was then removed and the pedicle channel probed demonstrating patency no sign of rupture the cortex of the pedicle. Tapping with a 4 mm screw tap then 6 mm tap then 6.6/5.5 mm x 45 mm screw was placed with the saddle fastener to be placed later on the left side at the L2 level. C-arm fluoroscopy was then brought into the field and using C-arm fluoroscopy then a hole made into the posterior medial aspect of the pedicle of right L2 observed in the pedicle using ball tipped nerve hook and hockey stick nerve probe initial entry was determined on fluoroscopy to  be good position alignment so that 4.073mtap was then used to tap the right L2 pedicle to a depth of nearly 45 mm observed on C-arm fluoroscopy to be beyond the midpoint of the lumbar vertebra and then position alignment within the right L2 pedicle this was then removed and the pedicle channel probed demonstrating patency no sign of rupture the cortex of the pedicle. Tapping with a 5 mm screw tap then 6.6/5.5 mm x 45 mm screw was placed . C arm repositioned then a hole made into the medial aspect of the pedicle of L3 observed in the pedicle using C arm at the 5 oclock position on the left L3 pedicle nerve probe initial entry was determined on fluoroscopy to be good position alignment so that a 4.48m71map was passed to 30 mm within the left L3 pedicle to a depth of nearly 40 mm observed on C-arm fluoroscopy to be beyond the midpoint of the lumbar vertebra and then position alignment within the left L3 pedicle this was then removed and the pedicle channel probed demonstrating patency no sign of rupture the cortex of the pedicle. Tapping with  a 4 mm screw tap then 5 mm tap then 7.5 mm x 45 mm screw was placed with the the fastener to be attached following decompression and TLIF on the left side at the L3 level. C-arm fluoroscopy was then brought into the field and using C-arm fluoroscopy then a hole made into the posterior medial aspect of the pedicle of right L3 observed in the pedicle using ball tipped nerve hook and hockey stick nerve probe initial entry was determined on fluoroscopy to be good position alignment so that 4.84m tap was then used to tap the right L3 pedicle to a depth of nearly 45 mm observed on C-arm fluoroscopy to be beyond the midpoint of the lumbar vertebra and then position alignment within the right L3 pedicle this was then removed and the pedicle channel probed demonstrating patency no sign of rupture the cortex of the pedicle. Tapping with a 5 mm screw tap then 7.5 mm x 45 mm screw was placed  in the right L3 pedicle.  C-arm fluoroscopy was then brought into the field and using C-arm fluoroscopy then a hole made into the posterior and medial aspect of the left pedicle of L4 observed in the pedicle using ball tipped nerve hook and hockey stick nerve probe initial entry was determined on fluoroscopy to be good position alignment so that a 4.0264mtap was then used to tap the left L4 pedicle to a depth of nearly 45 mm observed on C-arm fluoroscopy to be beyond the posterior one third of the lumbar vertebra and good position alignment within the left L4 pedicle this was then removed and the pedicle channel probed demonstrating patency no sign of rupture the cortex of the pedicle. Tapping with a 5 mm screw tap then a 6.64m77map and then a 7.64mm66mp and a 7.5 mm x 45 mm screw was placed with the the fastener to be attached following decompression and TLIF on the left side at the L4 level.  C-arm fluoroscopy was then brought into the field and using C-arm fluoroscopy then a hole made into the posterior and medial aspect of the right pedicle of L4 observed in the pedicle using ball tipped nerve hook and hockey stick nerve probe initial entry was determined on fluoroscopy to be good position alignment so that a 4.0mm 44m was then used to tap the right L4 pedicle to a depth of nearly 45 mm observed on C-arm fluoroscopy to be beyond the posterior one third of the lumbar vertebra and good position alignment within the right L4 pedicle this was then removed and the pedicle channel probed demonstrating patency no sign of rupture the cortex of the pedicle. Tapping with a 5 mm screw tap then a 6.5 mm tap then a 7.5 mm tap then 7.0mm x39m mm screw was placed on the right side at the L4 level. Using C-arm fluoroscopy then the hole into the pedicle of left L5 was placed and observed with ball-tipped probe the L5 pedicle on this side was 7.5 mm x 45 mm. A 7.64mm X 71mm ca38mlly passed down the center of the L5 pedicle to a  depth of nearly 45 mm. Observed on C-arm fluoroscopy to be in good position alignment channel was probed with a ball-tipped probe ensure patency no sign of cortical disruption. Following tapping with a 5 mm tap, 6.64mm tap 89m then a 7.64mm tap, 28m.5 mm x 45 mm screw was placed with the the fastener to be attached following decompression and TLIF on  the left side pedicle at L5. C-arm fluoroscopy was used to localize the hole made in the medial aspect of the pedicle of L5 on the right localizing the pedicle within the spinal canal with nerve hook and hockey-stick nerve probe carefully passed down the center of the L5 pedicle to a depth of nearly 45 mm. Observed on C-arm fluoroscopy to be in good position alignment channel was probed with a ball-tipped probe ensure predicle screw  to be placed on the right side at the L5 level following decompression and TLIFs..C-arm fluoroscopy was used to localize the hole made in the lateral aspect of the pedicle of S1 on the right localizing the pedicle within the spinal canal with nerve hook and hockey-stick nerve probe re patency no sign of cortical disruption. Following tapping with a 4 mm, 38m and 6 mm taps a 7.5 x 45 mm screw was placed in the pedicle right L5. Flow Seal was used for hemostasis of the left L2, L3, L4 and L5 pedicle screw holes.  Left lamina of  L2, L3 and L4 were then resected down to the base the lamina at each segment the lower 50% of the spinous process of L2 was resected and Leksell rongeur used to resect inferior aspect of the lamina on the left side at the L4 level and partially on the right side at L4 The left medial 40% of the facet of L2-3, L3-4 and L4-5 were resected in order to decompress the left side of the lumbar thecal sac at  L2-3,L3-4 and L4-5 and decompress the left L2,L3, L4 and L5 neuroforamen. Osteotomes and 267mand 24m18merrisons were used for this portion of the decompression. Similarly the right side decompression was carried out but  complete facetectomies were perform on the left at L2-3. L3-4 and L4-5 to provide for exposure of the left side L2-3, L3-4 and L4-5 neuroforamen for ease of placement of TLIFs (transforaminal lumbar interbody fusion) at each level inferior portions of the lamina and pars were also resected first beginning with the Leksell rongeur and osteotomes and then resecting using 2 and 3 mm Kerrison. Continued laminectomy was carried out resecting the central portions of the lamina of left L2,L3 and L4 performing foraminotomies on the right side at the L2, L3, L4 and L5 levels. The inferior articular process L2, L3 and L4 were resected on the right side. The L5 nerve root identified bilaterally and the medial aspect of the L5 pedicle. Superior articular process of L5 was then resected from the left side further decompressing the left L5 nerve and providing for exposure of the area just superior to the L5 pedicle for a placement of L4-5 cage. A large amount of hypertrophic ligmentum flavum was found impressing on the right lateral recesses at L2-3, L3-4 and L4-5 and narrowing the respective L4 and L5 neuroforamen.  Loupe magnification and headlight were used during this portion procedure.  Attention then turned to placement of the transforaminal lumbar interbody fusion cages. Using a Penfield 4 the left lateral aspect of the thecal sac at the L4-5 disc space was carefully freed up The thecal sac could then easily be retracted in the posterior lateral aspect of the L4-5 disc was exposed 15 blade scalpel used to incise the posterolateral disc and an osteotome used to resect a small portion of bone off the superior aspect of the posterior superior vertebral body of L5 in order to ease the entry into the L4-5 disc space. A  2mm81mrrison rongeur was  then able to be introduced in the disc space debrided it was quite narrow. 7 mm dilator was used to dialate the L4-5 disc space on the left side attempts were made to dilate further to  the10 mm were successful and using small curettes and the disc space was debrided a minimal degenerative disc present in the endplates debrided to bleeding endplate bone. Shavers were inserted to trial the intervertebral disc space. A 43m x 239mGlobus lordotic bullet cage was carefully packed with morcellized bone graft and the been harvested from previous laminotomies.The cage was then inserted with the articulating insertion handle.  Additional bone graft was then packed into the intervertebral disc space. Bleeding controlled using bipolar electrocautery thrombin soaked gel cottonoids. Attention then turned to the left L3-4 level similarly the exposure the posterior lateral aspect this was carried out using a Penfield 4 bipolar electrocautery to control small bleeders present. Derricho retractor used to retract the thecal sac and L3 nerve root a 15 blade scalpel was used to incise posterior lateral aspect of the left L3-4 disc the disc space at this level showed a rather severe narrowing posteriorly was more open anteriorly so that an osteotome again was used to resect a small portion the posterior superior lip of the vertebral body at L4  in order to gain ease of access into the L3-4 disc space. The space was debrided of degenerative disc material using pituitary along root the entire disc space was then debrided of degenerative disc material using pituitary rongeurs curettage down to bleeding bone endplates. 59m56mnd 8 mm shavers were used to debride the disc space and pituitary ronguers used to remove the loosened debris. This space was then carefully assess using spacers  a 8.0mm33mial cage provided the best fit, the Depuy concorde bullet lordotic cage 8mm 41m6mm 21mus lordotic bullet cage was chosen so that the permanent 8.0 mm cage by 26 mm Lordotic Bullet cage packed with local bone graft was placed into the intervertebral disc space. The posterior intervertebral disc space was then packed with autogenous  local bone graft that been harvested from the central laminectomy. Bleeding controlled using bipolar electrocautery.  Observed on C-arm fluoroscopy to be in good position alignment. Attention then turned to the left L2-3 level similarly the exposure the posterior lateral aspect this was carried out using a Penfield 4 bipolar electrocautery to control small bleeders present. Derricho retractor used to retract the thecal sac and L2 nerve root a 15 blade scalpel was used to incise posterior lateral aspect of the left L2-3 disc the disc space at this level showed a less narrowing posteriorly was more open anteriorly however the left interpedicular space was narrow so that an osteotome again was used to resect a small portion the posterior superior lip of the vertebral body at L3 in order to gain ease of access into the L2-3 disc space. The space was debrided of degenerative disc material using pituitary along root the entire disc space was then debrided of degenerative disc material using pituitary rongeurs curettage down to bleeding bone endplates. 8mm an33m mm shavers were used to debride the disc space and pituitary ronguers used to remove the loosened debris. This space was then carefully assess using spacers  a 10.0mm tri9mcage provided the best fit, the Globus Sable Lift type Cage 6-12mm x 281m was19msen so that the permanent adjustable height cage 6-12x 26 mm was packed with local bone graft was placed into the intervertebral disc space.  The posterior intervertebral disc space was then packed with autogenous local bone graft that been harvested from the central laminectomy. The cage with expanded and filled via the insertion sleeve with vivigen. The insertion handle then removed. Bleeding controlled using bipolar electrocautery.  Observed on C-arm fluoroscopy to be in good position alignment. The cages at L2-3, L3-4 and L4-5 were placed anteriorly as best as possible the correct patient's kyphosis that was  present. With this then the transforaminal lumbar interbody fusion portion of the case was completed bleeders were controlled using bipolar electrocautery thrombin-soaked Gelfoam were appropriate. Decortication of the right facet joints carried out at L2-3, L3-4 and L4-5. These were packed with cancellous local bone graft.  The 3 pedicle screw fasteners on the left were each placed and then each fastener carefully aligned  to allow for placement of rods. The left rod was a precontoured 150 mm rod. This was then placed into the pedicle screws on the left extending from L2 to L5 each of the caps were carefully placed loosely tightened.Caps onto the left L2 fastener was tightened to 80 foot lbs. Across the right side a precontoured 145m titanium rod was placed into the L2 to L5 fasteners and the upper cap tightened to 80 foot lbs., compression was obtained on the left side between L2 and L3 compressing between the fasteners and tightening the screw caps 85 pounds.The left L3-4 fasteners compressed and the left L4 cap tightened to 828ft lbs. Then the left L4-5 fastener similarly compressed and the L5 cap tightened to the proper torque. The right side rod was treated similarly with compression and tightening of the caps sequentially. Copious amounts of saline solution this was done throughout the case. Cell Saver was used during the case. 240cc cell saver blood returned to the patient.  Hockey stick neuroprobe was used to probe the neuroforamen bilateral L2, L3, L4 and L5 these were determined to be well decompressed. Permanent C-arm images were obtained in AP and lateral plane and oblique planes. Remaining local bone graft was then applied along both lateral posterior lateral region extending from right L2 to L5 facet bed.Gelfoam was then removed spinal canal. With this then the transforaminal lumbar interbody fusion portion of the case was completed bleeders were controlled using bipolar electrocautery  thrombin-soaked Gelfoam were appropriate.The lower 40% of the L4 spinous process removed and a 32-40 Globus inter rod cross link was placed between the L4 and L5 fasteners. The screws torqued to the appropriate value. Gelfoam was then removed spinal canal. Two small 51m53mlebs were evaluated and showed no CSF leakage, a 1cmx1cm duragen patch was placed along the area of the blebs held in place by the lateral wall of the laminectomy site.The lumbodorsal musculature carefully exam debrided of any devitalized tissue following removal of Viper retractors were the bleeders were controlled using electrocautery and the area dorsal lumbar muscle were then approximated in the midline with interrupted #1 Vicryl sutures loose the dorsal fascia was reattached to the spinous process of L2 to superiorly and L5  inferiorly this was done with #1 Vicryl sutures. Subcutaneous layers then approximated using interrupted 0 Vicryl sutures and 2-0 Vicryl sutures. Skin was closed with  stainless steel staples then MedPlex bandage applied. All instrument and sponge counts were correct. The patient was then returned to a supine position on her bed reactivated extubated and returned to the recovery room in satisfactory condition.   JamBenjiman Core-C perform the duties of assistant surgeon during this case.He was present  from the beginning of the case to the end of the case assisting in transfer the patient from his stretcher to the OR table and back to the stretcher at the end of the case. Assisted in careful retraction and suction of the laminectomy site delicate neural structures operating under the operating room microscope. He performed closure of the incision from the fascia to the skin applying the dressing.         Basil Dess  09/02/2019, 4:05 PM  09/02/2019

## 2019-09-02 NOTE — Brief Op Note (Signed)
09/02/2019  4:01 PM  PATIENT:  Vanessa Medina  48 y.o. female  PRE-OPERATIVE DIAGNOSIS:  lumbar degenerative disc disease L2-3, L3-4, L4-5  POST-OPERATIVE DIAGNOSIS:  lumbar degenerative disc disease L2-3, L3-4, L4-5  PROCEDURE:  Procedure(s): TRANSFORAMINAL LUMBAR INTERBODY FUSION LEFT LUMBAR TWO THROUGH LUMBAR THREE, LUMBAR THREE THROUGH LUMBAR FOUR, LUMBAR FOUR THROUGH LUMBAR FIVE WITH PEDICLE SCREWS, RODS, CAGES, LOCAL BONE GRAFT, ALLOGRAFT BONE GRAFT, VIVIGEN (N/A)  SURGEON:  Surgeon(s) and Role:    * Jessy Oto, MD - Primary  PHYSICIAN ASSISTANT: Benjiman Core, PA-C   ANESTHESIA:   local and general , Dr. Tobias Alexander.   EBL:  500 mL   BLOOD ADMINISTERED:250 CC CELLSAVER  DRAINS: Urinary Catheter (Foley)   LOCAL MEDICATIONS USED:  MARCAINE 0.5% 1:1 EXPAREL 1.3% Amount: 40 ml  SPECIMEN:  No Specimen  DISPOSITION OF SPECIMEN:  N/A  COUNTS:  YES  TOURNIQUET:  * No tourniquets in log *  DICTATION: .Dragon Dictation  PLAN OF CARE: Admit to inpatient   PATIENT DISPOSITION:  PACU - hemodynamically stable.   Delay start of Pharmacological VTE agent (>24hrs) due to surgical blood loss or risk of bleeding: yes

## 2019-09-02 NOTE — Anesthesia Procedure Notes (Signed)
Procedure Name: Intubation Date/Time: 09/02/2019 7:45 AM Performed by: Neldon Newport, CRNA Pre-anesthesia Checklist: Timeout performed, Patient being monitored, Suction available, Emergency Drugs available and Patient identified Patient Re-evaluated:Patient Re-evaluated prior to induction Oxygen Delivery Method: Circle system utilized Preoxygenation: Pre-oxygenation with 100% oxygen Induction Type: IV induction Ventilation: Mask ventilation without difficulty Laryngoscope Size: Mac and 3 Grade View: Grade I Tube type: Oral Tube size: 7.0 mm Number of attempts: 1 Placement Confirmation: breath sounds checked- equal and bilateral,  positive ETCO2 and ETT inserted through vocal cords under direct vision Secured at: 21 cm Tube secured with: Tape Dental Injury: Teeth and Oropharynx as per pre-operative assessment

## 2019-09-02 NOTE — Anesthesia Postprocedure Evaluation (Signed)
Anesthesia Post Note  Patient: SARIYA TRICKEY  Procedure(s) Performed: TRANSFORAMINAL LUMBAR INTERBODY FUSION LEFT LUMBAR TWO THROUGH LUMBAR THREE, LUMBAR THREE THROUGH LUMBAR FOUR, LUMBAR FOUR THROUGH LUMBAR FIVE WITH PEDICLE SCREWS, RODS, CAGES, LOCAL BONE GRAFT, ALLOGRAFT BONE GRAFT, VIVIGEN (N/A Spine Lumbar)     Patient location during evaluation: PACU Anesthesia Type: General Level of consciousness: awake and alert Pain management: pain level controlled Vital Signs Assessment: post-procedure vital signs reviewed and stable Respiratory status: spontaneous breathing, nonlabored ventilation, respiratory function stable and patient connected to nasal cannula oxygen Cardiovascular status: blood pressure returned to baseline and stable Postop Assessment: no apparent nausea or vomiting Anesthetic complications: no    Last Vitals:  Vitals:   09/02/19 1714 09/02/19 1715  BP: (!) 102/47   Pulse: 97   Resp: (!) 49   Temp:  (!) 36.4 C  SpO2: 98%     Last Pain:  Vitals:   09/02/19 1700  TempSrc:   PainSc: Asleep                 Nicholson Starace DANIEL

## 2019-09-02 NOTE — Transfer of Care (Signed)
Immediate Anesthesia Transfer of Care Note  Patient: Vanessa Medina  Procedure(s) Performed: TRANSFORAMINAL LUMBAR INTERBODY FUSION LEFT LUMBAR TWO THROUGH LUMBAR THREE, LUMBAR THREE THROUGH LUMBAR FOUR, LUMBAR FOUR THROUGH LUMBAR FIVE WITH PEDICLE SCREWS, RODS, CAGES, LOCAL BONE GRAFT, ALLOGRAFT BONE GRAFT, VIVIGEN (N/A Spine Lumbar)  Patient Location: PACU  Anesthesia Type:General  Level of Consciousness: awake, alert , oriented and patient cooperative  Airway & Oxygen Therapy: Patient Spontanous Breathing and Patient connected to nasal cannula oxygen  Post-op Assessment: Report given to RN and Post -op Vital signs reviewed and stable  Post vital signs: Reviewed and stable  Last Vitals:  Vitals Value Taken Time  BP 98/54 09/02/19 1600  Temp    Pulse 103 09/02/19 1602  Resp 25 09/02/19 1602  SpO2 97 % 09/02/19 1602  Vitals shown include unvalidated device data.  Last Pain:  Vitals:   09/02/19 0727  TempSrc:   PainSc: 9       Patients Stated Pain Goal: 3 (12/45/80 9983)  Complications: No apparent anesthesia complications

## 2019-09-03 LAB — BASIC METABOLIC PANEL
Anion gap: 8 (ref 5–15)
BUN: 13 mg/dL (ref 6–20)
CO2: 22 mmol/L (ref 22–32)
Calcium: 8.1 mg/dL — ABNORMAL LOW (ref 8.9–10.3)
Chloride: 108 mmol/L (ref 98–111)
Creatinine, Ser: 1.09 mg/dL — ABNORMAL HIGH (ref 0.44–1.00)
GFR calc Af Amer: >60 mL/min (ref >60)
GFR calc non Af Amer: 60 mL/min — ABNORMAL LOW (ref >60)
Glucose, Bld: 102 mg/dL — ABNORMAL HIGH (ref 70–99)
Potassium: 3.5 mmol/L (ref 3.5–5.1)
Sodium: 138 mmol/L (ref 135–145)

## 2019-09-03 LAB — CBC
HCT: 30.8 % — ABNORMAL LOW (ref 36.0–46.0)
Hemoglobin: 10.3 g/dL — ABNORMAL LOW (ref 12.0–15.0)
MCH: 33.1 pg (ref 26.0–34.0)
MCHC: 33.4 g/dL (ref 30.0–36.0)
MCV: 99 fL (ref 80.0–100.0)
Platelets: 257 10*3/uL (ref 150–400)
RBC: 3.11 MIL/uL — ABNORMAL LOW (ref 3.87–5.11)
RDW: 12.5 % (ref 11.5–15.5)
WBC: 7.1 10*3/uL (ref 4.0–10.5)
nRBC: 0 % (ref 0.0–0.2)

## 2019-09-03 MED FILL — Thrombin (Recombinant) For Soln 20000 Unit: CUTANEOUS | Qty: 1 | Status: AC

## 2019-09-03 NOTE — Progress Notes (Signed)
     Subjective: 1 Day Post-Op Procedure(s) (LRB): TRANSFORAMINAL LUMBAR INTERBODY FUSION LEFT LUMBAR TWO THROUGH LUMBAR THREE, LUMBAR THREE THROUGH LUMBAR FOUR, LUMBAR FOUR THROUGH LUMBAR FIVE WITH PEDICLE SCREWS, RODS, CAGES, LOCAL BONE GRAFT, ALLOGRAFT BONE GRAFT, VIVIGEN (N/A) Awake, alert and oriented x 4. Foley removed this AM and has not voided yet. Prolong surgery has some chest discomfort due to pressure on chest lying on chest pad for 6 hours.   Patient reports pain as moderate.    Objective:   VITALS:  Temp:  [97.5 F (36.4 C)-98.7 F (37.1 C)] 98.7 F (37.1 C) (12/30 0411) Pulse Rate:  [57-116] 84 (12/30 0411) Resp:  [12-49] 14 (12/30 0411) BP: (93-115)/(44-62) 105/50 (12/30 0411) SpO2:  [91 %-98 %] 96 % (12/30 0411)  Neurologically intact ABD soft Neurovascular intact Sensation intact distally Intact pulses distally Dorsiflexion/Plantar flexion intact Incision: dressing C/D/I and no drainage   LABS Recent Labs    09/03/19 0505  HGB 10.3*  WBC 7.1  PLT 257   Recent Labs    09/03/19 0505  NA 138  K 3.5  CL 108  CO2 22  BUN 13  CREATININE 1.09*  GLUCOSE 102*   No results for input(s): LABPT, INR in the last 72 hours.   Assessment/Plan: 1 Day Post-Op Procedure(s) (LRB): TRANSFORAMINAL LUMBAR INTERBODY FUSION LEFT LUMBAR TWO THROUGH LUMBAR THREE, LUMBAR THREE THROUGH LUMBAR FOUR, LUMBAR FOUR THROUGH LUMBAR FIVE WITH PEDICLE SCREWS, RODS, CAGES, LOCAL BONE GRAFT, ALLOGRAFT BONE GRAFT, VIVIGEN (N/A)  Advance diet Up with therapy Discharge home with home health  Basil Dess 09/03/2019, 8:36 AMPatient ID: Vanessa Medina, female   DOB: 1971-05-04, 48 y.o.   MRN: [id number]

## 2019-09-03 NOTE — Evaluation (Signed)
Physical Therapy Evaluation Patient Details Name: Vanessa Medina MRN: [id number] DOB: June 28, 1971 Today's Date: 09/03/2019   History of Present Illness  Pt is a 48 y/o female s/p L2-L5 TLIF. PMH including but not limited to HTN, migraines, L THA in 2017.  Clinical Impression  Pt presented supine in bed with HOB elevated, awake and willing to participate in therapy session. Prior to admission, pt reported that she was independent with all functional mobility and ADLs. Pt very limited this session secondary to pain. PT reviewed 3/3 back precautions and log roll technique with pt throughout. She required min A for bed mobility and min guard for transfers. Pt ambulated a short distance in her room with RW and min guard for safety. Pt would continue to benefit from skilled physical therapy services at this time while admitted and after d/c to address the below listed limitations in order to improve overall safety and independence with functional mobility.     Follow Up Recommendations Home health PT;Supervision/Assistance - 24 hour    Equipment Recommendations  3in1 (PT)    Recommendations for Other Services       Precautions / Restrictions Precautions Precautions: Back;Fall Precaution Comments: reviewed 3/3 back precautions with pt throughout, including log roll technique Required Braces or Orthoses: Spinal Brace Spinal Brace: Lumbar corset;Applied in sitting position Restrictions Weight Bearing Restrictions: No      Mobility  Bed Mobility Overal bed mobility: Needs Assistance Bed Mobility: Rolling;Sidelying to Sit Rolling: Min guard Sidelying to sit: Min assist       General bed mobility comments: cueing for log roll technique, use of bed rail, min A to move bilateral LEs off of bed; very slow, cautious and guarded  Transfers Overall transfer level: Needs assistance Equipment used: Rolling walker (2 wheeled) Transfers: Sit to/from Stand Sit to Stand: Min guard          General transfer comment: cueing for technique and safe hand placement, min guard for safety with transition  Ambulation/Gait Ambulation/Gait assistance: Min guard Gait Distance (Feet): 15 Feet Assistive device: Rolling walker (2 wheeled) Gait Pattern/deviations: Step-to pattern;Step-through pattern;Decreased step length - right;Decreased step length - left;Decreased stride length Gait velocity: significantly decreased   General Gait Details: pt with extremely slow gait cadence/speed, very short step length bilaterally; no instability or LOB, min guard for safety, limited secondary to pain  Stairs            Wheelchair Mobility    Modified Rankin (Stroke Patients Only)       Balance Overall balance assessment: Needs assistance Sitting-balance support: Feet supported Sitting balance-Leahy Scale: Fair     Standing balance support: Bilateral upper extremity supported;Single extremity supported Standing balance-Leahy Scale: Poor                               Pertinent Vitals/Pain Pain Assessment: Faces Faces Pain Scale: Hurts whole lot Pain Location: back and L LE Pain Descriptors / Indicators: Grimacing;Guarding Pain Intervention(s): Monitored during session;Repositioned;Patient requesting pain meds-RN notified;RN gave pain meds during session    Home Living Family/patient expects to be discharged to:: Private residence Living Arrangements: Parent Available Help at Discharge: Family;Available 24 hours/day Type of Home: House Home Access: Stairs to enter Entrance Stairs-Rails: Psychiatric nurse of Steps: 3 Home Layout: One level Home Equipment: Walker - 2 wheels;Walker - 4 wheels      Prior Function Level of Independence: Independent  Hand Dominance        Extremity/Trunk Assessment   Upper Extremity Assessment Upper Extremity Assessment: Overall WFL for tasks assessed    Lower Extremity Assessment Lower  Extremity Assessment: Overall WFL for tasks assessed    Cervical / Trunk Assessment Cervical / Trunk Assessment: Other exceptions Cervical / Trunk Exceptions: s/p lumbar sx  Communication   Communication: No difficulties  Cognition Arousal/Alertness: Awake/alert Behavior During Therapy: WFL for tasks assessed/performed;Anxious Overall Cognitive Status: Within Functional Limits for tasks assessed                                        General Comments      Exercises     Assessment/Plan    PT Assessment Patient needs continued PT services  PT Problem List Decreased strength;Decreased activity tolerance;Decreased balance;Decreased mobility;Decreased coordination;Decreased knowledge of use of DME;Decreased safety awareness;Decreased knowledge of precautions;Pain       PT Treatment Interventions DME instruction;Gait training;Therapeutic activities;Therapeutic exercise;Stair training;Functional mobility training;Balance training;Neuromuscular re-education;Patient/family education    PT Goals (Current goals can be found in the Care Plan section)  Acute Rehab PT Goals Patient Stated Goal: decrease pain PT Goal Formulation: With patient Time For Goal Achievement: 09/17/19 Potential to Achieve Goals: Good    Frequency Min 5X/week   Barriers to discharge        Co-evaluation               AM-PAC PT "6 Clicks" Mobility  Outcome Measure Help needed turning from your back to your side while in a flat bed without using bedrails?: A Little Help needed moving from lying on your back to sitting on the side of a flat bed without using bedrails?: A Little Help needed moving to and from a bed to a chair (including a wheelchair)?: A Little Help needed standing up from a chair using your arms (e.g., wheelchair or bedside chair)?: A Little Help needed to walk in hospital room?: A Little Help needed climbing 3-5 steps with a railing? : A Little 6 Click Score: 18     End of Session Equipment Utilized During Treatment: Back brace;Gait belt Activity Tolerance: Patient limited by pain Patient left: in chair;with call bell/phone within reach;Other (comment)(NT present) Nurse Communication: Mobility status;Patient requests pain meds PT Visit Diagnosis: Other abnormalities of gait and mobility (R26.89);Pain Pain - part of body: (back)    Time: 3546-5681 PT Time Calculation (min) (ACUTE ONLY): 58 min   Charges:   PT Evaluation $PT Eval Moderate Complexity: 1 Mod PT Treatments $Gait Training: 8-22 mins $Therapeutic Activity: 23-37 mins        Anastasio Champion, DPT  Acute Rehabilitation Services Pager [phone] Office [place] 09/03/2019, 11:48 AM

## 2019-09-04 ENCOUNTER — Encounter: Payer: Self-pay | Admitting: *Deleted

## 2019-09-04 MED ORDER — BUTALBITAL-APAP-CAFFEINE 50-325-40 MG PO TABS
2.0000 | ORAL_TABLET | Freq: Four times a day (QID) | ORAL | Status: DC | PRN
  Administered 2019-09-04 – 2019-09-05 (×2): 2 via ORAL
  Filled 2019-09-04 (×3): qty 2

## 2019-09-04 MED ORDER — PANTOPRAZOLE SODIUM 40 MG PO TBEC
40.0000 mg | DELAYED_RELEASE_TABLET | Freq: Every day | ORAL | Status: DC
  Administered 2019-09-04 – 2019-09-07 (×4): 40 mg via ORAL
  Filled 2019-09-04 (×4): qty 1

## 2019-09-04 NOTE — Progress Notes (Signed)
PHARMACIST - PHYSICIAN COMMUNICATION DR:   Louanne Skye CONCERNING: Protonix IV to Oral Route Change Policy  RECOMMENDATION: This patient is receiving Protonix by the intravenous route.  Based on criteria approved by the Pharmacy and Therapeutics Committee, this drug is being converted to the equivalent oral dose form(s).  DESCRIPTION: These criteria include:  The patient is eating (either orally or via tube) and/or has been taking other orally administered medications for a least 24 hours  There is no active GI bleed or impaired GI absorption noted.   If you have questions about this conversion, please contact the Pharmacy Department  []   [phone] )  [facility] [x]   [phone] )  [facility]  []   [phone] )  [facility] []   [phone] )  [place], PharmD, BCPS 2:54 PM

## 2019-09-04 NOTE — Progress Notes (Signed)
Physical Therapy Treatment Patient Details Name: Vanessa Medina MRN: [id number] DOB: 17-Oct-1970 Today's Date: 09/04/2019    History of Present Illness Pt is a 48 y/o female s/p L2-L5 TLIF. PMH including but not limited to HTN, migraines, L THA in 2017.    PT Comments    Pt remains very limited overall secondary to pain. She required less physical assistance than previous session; however, remains very guarded and cautious especially with gait. She is adamant about returning home and is agreeable to HHPT. Pt would continue to benefit from skilled physical therapy services at this time while admitted and after d/c to address the below listed limitations in order to improve overall safety and independence with functional mobility.   Follow Up Recommendations  Home health PT;Supervision/Assistance - 24 hour     Equipment Recommendations  3in1 (PT)    Recommendations for Other Services       Precautions / Restrictions Precautions Precautions: Back;Fall Required Braces or Orthoses: Spinal Brace Spinal Brace: Lumbar corset;Applied in sitting position Restrictions Weight Bearing Restrictions: No    Mobility  Bed Mobility Overal bed mobility: Needs Assistance Bed Mobility: Rolling;Sidelying to Sit Rolling: Min guard Sidelying to sit: Min assist       General bed mobility comments: cueing for log roll technique, use of bed rail, min A to move bilateral LEs off of bed; very slow, cautious and guarded  Transfers Overall transfer level: Needs assistance Equipment used: Rolling walker (2 wheeled) Transfers: Sit to/from Stand Sit to Stand: Min guard         General transfer comment: good technique utilized, min guard for safety with transition  Ambulation/Gait Ambulation/Gait assistance: Counsellor (Feet): 15 Feet Assistive device: Rolling walker (2 wheeled) Gait Pattern/deviations: Step-to pattern;Step-through pattern;Decreased step length - right;Decreased  step length - left;Decreased stride length Gait velocity: significantly decreased   General Gait Details: pt with extremely slow gait cadence/speed, very short step length bilaterally; no instability or LOB, min guard for safety, limited secondary to pain   Stairs             Wheelchair Mobility    Modified Rankin (Stroke Patients Only)       Balance Overall balance assessment: Needs assistance Sitting-balance support: Feet supported Sitting balance-Leahy Scale: Fair     Standing balance support: Bilateral upper extremity supported;Single extremity supported Standing balance-Leahy Scale: Poor                              Cognition Arousal/Alertness: Awake/alert Behavior During Therapy: WFL for tasks assessed/performed;Anxious Overall Cognitive Status: Within Functional Limits for tasks assessed                                        Exercises      General Comments        Pertinent Vitals/Pain Pain Assessment: Faces Faces Pain Scale: Hurts whole lot Pain Location: L LE, back and headache Pain Descriptors / Indicators: Grimacing;Guarding Pain Intervention(s): Monitored during session;Repositioned    Home Living                      Prior Function            PT Goals (current goals can now be found in the care plan section) Acute Rehab PT Goals PT Goal Formulation: With patient Time  For Goal Achievement: 09/17/19 Potential to Achieve Goals: Good Progress towards PT goals: Progressing toward goals    Frequency    Min 5X/week      PT Plan Current plan remains appropriate    Co-evaluation              AM-PAC PT "6 Clicks" Mobility   Outcome Measure  Help needed turning from your back to your side while in a flat bed without using bedrails?: A Little Help needed moving from lying on your back to sitting on the side of a flat bed without using bedrails?: A Little Help needed moving to and from a bed  to a chair (including a wheelchair)?: A Little Help needed standing up from a chair using your arms (e.g., wheelchair or bedside chair)?: A Little Help needed to walk in hospital room?: A Little Help needed climbing 3-5 steps with a railing? : A Little 6 Click Score: 18    End of Session Equipment Utilized During Treatment: Back brace;Gait belt Activity Tolerance: Patient limited by pain Patient left: in chair;with call bell/phone within reach Nurse Communication: Mobility status PT Visit Diagnosis: Other abnormalities of gait and mobility (R26.89);Pain Pain - part of body: (back)     Time: 0768-0881 PT Time Calculation (min) (ACUTE ONLY): 43 min  Charges:  $Gait Training: 8-22 mins $Therapeutic Activity: 23-37 mins                     Anastasio Champion, DPT  Acute Rehabilitation Services Pager [phone] Office [place] 09/04/2019, 11:01 AM

## 2019-09-04 NOTE — Evaluation (Signed)
Occupational Therapy Evaluation Patient Details Name: Vanessa Medina MRN: [id number] DOB: 11-May-1971 Today's Date: 09/04/2019    History of Present Illness Pt is a 48 y/o female s/p L2-L5 TLIF. PMH including but not limited to HTN, migraines, L THA in 2017.   Clinical Impression   Pt PTA: living with family and pt reports that she was generally independent. Pt currently limited by pain in back and headache today. Adaptive equipment education performed as pt donning/doffing RLE sock with good carry over skills from demo. Pt unable to perform own toilet hygiene due to pain in standing; pt sat at sink for grooming tasks. pt with very slow gait with RW. IVs in hands, hindering pt. Pt minguardA for mobility with RW. Back handout reviewed ADL in detail. Pt educated on: clothing between brace, never sleep in brace, set an alarm at night for medication, avoid sitting for long periods of time, correct bed positioning for sleeping, correct sequence for bed mobility, avoiding lifting more than 5 pounds and never wash directly over incision. Pt would benefit from continued OT skilled services for ADL, mobility and safety. OT following.    Follow Up Recommendations  Home health OT;Supervision/Assistance - 24 hour(initially)    Equipment Recommendations  3 in 1 bedside commode    Recommendations for Other Services       Precautions / Restrictions Precautions Precautions: Back;Fall Required Braces or Orthoses: Spinal Brace Restrictions Weight Bearing Restrictions: No      Mobility Bed Mobility Overal bed mobility: Needs Assistance Bed Mobility: Sit to Sidelying;Sit to Supine       Sit to supine: Min assist Sit to sidelying: Min assist General bed mobility comments: performing log roll with trunk and holding onto rail  Transfers Overall transfer level: Needs assistance Equipment used: Rolling walker (2 wheeled) Transfers: Sit to/from [facility] Sit to Stand: Min  guard Stand pivot transfers: Min guard       General transfer comment: good technique utilized, min guard for safety with transition    Balance Overall balance assessment: Needs assistance Sitting-balance support: Feet supported Sitting balance-Leahy Scale: Fair     Standing balance support: Bilateral upper extremity supported Standing balance-Leahy Scale: Poor                             ADL either performed or assessed with clinical judgement   ADL Overall ADL's : Needs assistance/impaired Eating/Feeding: Modified independent   Grooming: Min guard;Standing   Upper Body Bathing: Set up;Sitting   Lower Body Bathing: Moderate assistance;Cueing for safety;With adaptive equipment;Sitting/lateral leans;Sit to/from stand;Adhering to back precautions   Upper Body Dressing : Set up;Sitting   Lower Body Dressing: Minimal assistance;Moderate assistance;With adaptive equipment;Cueing for safety;Sitting/lateral leans;Sit to/from stand;Adhering to back precautions   Toilet Transfer: Min guard;RW;Ambulation   Toileting- Clothing Manipulation and Hygiene: Maximal assistance;Cueing for safety;Adhering to back precautions       Functional mobility during ADLs: Min guard;Rolling walker General ADL Comments: pt limited by pain in back adn headache today. Adaptive equipment education performed as pt donning/doffing RLE sock with good carry over skills from demo. Pt unable to perform own toilet hygiene due to pain in standing; pt sat at sink for grooming tasks. pt with very slow gait with RW. IVs in hands, hindering pt.       Vision Baseline Vision/History: No visual deficits Vision Assessment?: No apparent visual deficits     Perception     Praxis  Pertinent Vitals/Pain Pain Assessment: Faces Faces Pain Scale: Hurts whole lot Pain Location: L LE, back and headache Pain Descriptors / Indicators: Grimacing;Guarding Pain Intervention(s): Monitored during session      Hand Dominance     Extremity/Trunk Assessment Upper Extremity Assessment Upper Extremity Assessment: Overall WFL for tasks assessed   Lower Extremity Assessment Lower Extremity Assessment: Overall WFL for tasks assessed   Cervical / Trunk Assessment Cervical / Trunk Assessment: Other exceptions Cervical / Trunk Exceptions: s/p lumbar sx   Communication Communication Communication: No difficulties   Cognition Arousal/Alertness: Awake/alert Behavior During Therapy: WFL for tasks assessed/performed;Anxious Overall Cognitive Status: Within Functional Limits for tasks assessed                                     General Comments  Pt's son present for session.    Exercises     Shoulder Instructions      Home Living Family/patient expects to be discharged to:: Private residence Living Arrangements: Parent Available Help at Discharge: Family;Available 24 hours/day Type of Home: House Home Access: Stairs to enter [employer] of Steps: 3 Entrance Stairs-Rails: Right;Left Home Layout: One level     Bathroom Shower/Tub: Tub/shower unit         Home Equipment: Environmental consultant - 2 wheels;Walker - 4 wheels          Prior Functioning/Environment Level of Independence: Independent                 OT Problem List: Decreased strength;Decreased activity tolerance;Impaired balance (sitting and/or standing);Decreased safety awareness;Pain;Increased edema      OT Treatment/Interventions: Self-care/ADL training;Therapeutic exercise;Energy conservation;DME and/or AE instruction;Therapeutic activities;Patient/family education;Balance training    OT Goals(Current goals can be found in the care plan section) Acute Rehab OT Goals Patient Stated Goal: decrease pain OT Goal Formulation: With patient Time For Goal Achievement: 09/18/19 Potential to Achieve Goals: Good ADL Goals Pt Will Perform Grooming: with supervision;standing Pt Will Perform Lower  Body Dressing: with min assist;with adaptive equipment;sitting/lateral leans;sit to/from stand Pt Will Transfer to Toilet: with min guard assist;ambulating;bedside commode Pt Will Perform Toileting - Clothing Manipulation and hygiene: with min assist;sitting/lateral leans;sit to/from stand Pt/caregiver will Perform Home Exercise Program: Increased strength;Both right and left upper extremity  OT Frequency: Min 2X/week   Barriers to D/C:            Co-evaluation              AM-PAC OT "6 Clicks" Daily Activity     Outcome Measure Help from another person eating meals?: None Help from another person taking care of personal grooming?: A Little Help from another person toileting, which includes using toliet, bedpan, or urinal?: A Lot Help from another person bathing (including washing, rinsing, drying)?: A Lot Help from another person to put on and taking off regular upper body clothing?: A Little Help from another person to put on and taking off regular lower body clothing?: A Lot 6 Click Score: 16   End of Session Equipment Utilized During Treatment: Rolling walker;Back brace Nurse Communication: Mobility status  Activity Tolerance: Patient tolerated treatment well;Patient limited by pain Patient left: in bed;with call bell/phone within reach;with bed alarm set  OT Visit Diagnosis: Unsteadiness on feet (R26.81);Muscle weakness (generalized) (M62.81);Pain Pain - part of body: (back)                Time: 6962-9528 OT Time Calculation (min): 58  min Charges:  OT General Charges $OT Visit: 1 Visit OT Evaluation $OT Eval Moderate Complexity: 1 Mod OT Treatments $Self Care/Home Management : 23-37 mins $Therapeutic Activity: 8-22 mins  Jefferey Pica OTR/L Acute Rehabilitation Services Pager: [phone] Office: [phone]   Charlton Boule C 09/04/2019, 4:44 PM

## 2019-09-04 NOTE — Progress Notes (Signed)
     Subjective: 2 Days Post-Op Procedure(s) (LRB): TRANSFORAMINAL LUMBAR INTERBODY FUSION LEFT LUMBAR TWO THROUGH LUMBAR THREE, LUMBAR THREE THROUGH LUMBAR FOUR, LUMBAR FOUR THROUGH LUMBAR FIVE WITH PEDICLE SCREWS, RODS, CAGES, LOCAL BONE GRAFT, ALLOGRAFT BONE GRAFT, VIVIGEN (N/A) Awake, alert and oriented x 4. Voiding post removal of foley. In bed at 1 PM complaining of headache. History of migraine. No intraop dural tear.  No nuchal signs. Migraine like pain takes topamax but this does not seem to be helping.  Patient reports pain as moderate.    Objective:   VITALS:  Temp:  [98.6 F (37 C)-99.5 F (37.5 C)] 98.8 F (37.1 C) (12/31 0907) Pulse Rate:  [63-79] 79 (12/31 0907) Resp:  [14-18] 17 (12/31 0907) BP: (97-117)/(42-58) 117/55 (12/31 0907) SpO2:  [94 %-99 %] 99 % (12/31 0907)  Neurologically intact ABD soft Neurovascular intact Sensation intact distally Intact pulses distally Dorsiflexion/Plantar flexion intact Incision: dressing C/D/I and no drainage   LABS Recent Labs    09/03/19 0505  HGB 10.3*  WBC 7.1  PLT 257   Recent Labs    09/03/19 0505  NA 138  K 3.5  CL 108  CO2 22  BUN 13  CREATININE 1.09*  GLUCOSE 102*   No results for input(s): LABPT, INR in the last 72 hours.   Assessment/Plan: 2 Days Post-Op Procedure(s) (LRB): TRANSFORAMINAL LUMBAR INTERBODY FUSION LEFT LUMBAR TWO THROUGH LUMBAR THREE, LUMBAR THREE THROUGH LUMBAR FOUR, LUMBAR FOUR THROUGH LUMBAR FIVE WITH PEDICLE SCREWS, RODS, CAGES, LOCAL BONE GRAFT, ALLOGRAFT BONE GRAFT, VIVIGEN (N/A)  Anemia due to blood loss.  Migraines head aches.   Advance diet Up with therapy D/C IV fluids  Will ask for help with migraine treatment as she is already on  Topamax. Try fioricet for HAs. Change to hydromorphone for IV narcotic meds. Continue with low dose oxycontine and oxycodone.   Basil Dess 09/04/2019, 1:15 PMPatient ID: Vanessa Medina, female   DOB: 18-Jan-1971, 48 y.o.   MRN:  [id number]

## 2019-09-05 DIAGNOSIS — R569 Unspecified convulsions: Secondary | ICD-10-CM

## 2019-09-05 HISTORY — DX: Unspecified convulsions: R56.9

## 2019-09-05 MED ORDER — DIPHENHYDRAMINE HCL 25 MG PO CAPS
25.0000 mg | ORAL_CAPSULE | Freq: Four times a day (QID) | ORAL | Status: DC | PRN
  Administered 2019-09-05 – 2019-09-08 (×7): 25 mg via ORAL
  Filled 2019-09-05 (×7): qty 1

## 2019-09-05 NOTE — Progress Notes (Signed)
Physical Therapy Treatment Patient Details Name: Vanessa Medina MRN: [id number] DOB: 01/10/1971 Today's Date: 09/05/2019    History of Present Illness Pt is a 49 y/o female s/p L2-L5 TLIF. PMH including but not limited to HTN, migraines, L THA in 2017.    PT Comments    Pt pleasant sitting in recliner with legs elevated on arrival stating she has been sitting for >1.5 hrs. Pt educated for back precautions with handout provided, sitting position and to limit sitting <45 min. Pt continues to move very slowly and cautiously but does increase speed with assist to direct RW and max cues. Pt requires assist for basic transfers and mobility and reports dad can provide. Will continue to follow.    Follow Up Recommendations  Home health PT;Supervision/Assistance - 24 hour     Equipment Recommendations  3in1 (PT)    Recommendations for Other Services       Precautions / Restrictions Precautions Precautions: Back;Fall Precaution Booklet Issued: Yes (comment) Spinal Brace: Lumbar corset;Applied in sitting position Restrictions Weight Bearing Restrictions: No    Mobility  Bed Mobility Overal bed mobility: Needs Assistance Bed Mobility: Sit to Sidelying Rolling: Min guard       Sit to sidelying: Min assist General bed mobility comments: assist to lift left leg to surface with cues for sequence and precautions  Transfers     Transfers: Sit to/from Stand Sit to Stand: Min assist         General transfer comment: min assist to rise from recliner , min assist to lower to BSC, min guard from [facility] and to bed  Ambulation/Gait Ambulation/Gait assistance: Min guard Gait Distance (Feet): 80 Feet Assistive device: Rolling walker (2 wheeled) Gait Pattern/deviations: Step-to pattern;Step-through pattern;Decreased step length - right;Decreased step length - left;Decreased stride length   Gait velocity interpretation: <1.31 ft/sec, indicative of household ambulator General Gait  Details: pt with extremely slow gait cadence/speed, very short step length bilaterally taking grossly 25 min to walk 80' pt with improved speed at end of gait with assist for RW advancement for pt to step into RW. Verbal cues for speed and stride throughout   Stairs             Wheelchair Mobility    Modified Rankin (Stroke Patients Only)       Balance Overall balance assessment: Needs assistance   Sitting balance-Leahy Scale: Good     Standing balance support: Bilateral upper extremity supported Standing balance-Leahy Scale: Poor Standing balance comment: reliant on RW in standing                            Cognition Arousal/Alertness: Awake/alert Behavior During Therapy: WFL for tasks assessed/performed Overall Cognitive Status: Within Functional Limits for tasks assessed                                        Exercises      General Comments        Pertinent Vitals/Pain Faces Pain Scale: Hurts even more Pain Location: back Pain Descriptors / Indicators: Aching;Guarding Pain Intervention(s): Limited activity within patient's tolerance;Monitored during session;Premedicated before session;Repositioned    Home Living                      Prior Function            PT Goals (  current goals can now be found in the care plan section) Progress towards PT goals: Progressing toward goals    Frequency    Min 5X/week      PT Plan Current plan remains appropriate    Co-evaluation              AM-PAC PT "6 Clicks" Mobility   Outcome Measure  Help needed turning from your back to your side while in a flat bed without using bedrails?: A Little Help needed moving from lying on your back to sitting on the side of a flat bed without using bedrails?: A Little Help needed moving to and from a bed to a chair (including a wheelchair)?: A Little Help needed standing up from a chair using your arms (e.g., wheelchair or  bedside chair)?: A Little Help needed to walk in hospital room?: A Little Help needed climbing 3-5 steps with a railing? : A Little 6 Click Score: 18    End of Session Equipment Utilized During Treatment: Back brace Activity Tolerance: Patient tolerated treatment well Patient left: in bed;with call bell/phone within reach Nurse Communication: Mobility status;Precautions PT Visit Diagnosis: Other abnormalities of gait and mobility (R26.89);Pain;Difficulty in walking, not elsewhere classified (R26.2)     Time: 1120-1207 PT Time Calculation (min) (ACUTE ONLY): 47 min  Charges:  $Gait Training: 23-37 mins $Therapeutic Activity: 8-22 mins                     Sirius Woodford P, PT Acute Rehabilitation Services Pager: [phone] Office: [place] 09/05/2019, 12:44 PM

## 2019-09-05 NOTE — Plan of Care (Signed)
  Problem: Education: Goal: Knowledge of General Education information will improve Description: Including pain rating scale, medication(s)/side effects and non-pharmacologic comfort measures Outcome: Progressing   Problem: Activity: Goal: Risk for activity intolerance will decrease Outcome: Progressing   Problem: Coping: Goal: Level of anxiety will decrease Outcome: Progressing   Problem: Elimination: Goal: Will not experience complications related to bowel motility Outcome: Progressing   Problem: Pain Managment: Goal: General experience of comfort will improve Outcome: Progressing   Problem: Safety: Goal: Ability to remain free from injury will improve Outcome: Progressing   

## 2019-09-05 NOTE — Progress Notes (Signed)
Patient ID: Vanessa Medina, female   DOB: 12-Dec-1970, 49 y.o.   MRN: [id number] There has been no acute changes over the last 24 hours.  The patient's vital signs are stable.  She still reports significant pain which is limiting her mobility.  She still reports a slight headache.  She is sitting up in the chair now and has had a full breakfast.  She is still needing a lot of assistance with mobility.  Her gross strength is intact in her bilateral lower extremities.  I gave her as much encouragement as possible to try to get her motivated to increase her mobility so we can hopefully get her discharged home soon.  Right now, her pain is mainly limiting factor that is keeping her here.  We will continue to follow her closely with the hopes of discharge soon when she is able to mobilize better and have better pain control.

## 2019-09-05 NOTE — Plan of Care (Signed)
  Problem: Education: Goal: Knowledge of General Education information will improve Description: Including pain rating scale, medication(s)/side effects and non-pharmacologic comfort measures Outcome: Progressing   Problem: Health Behavior/Discharge Planning: Goal: Ability to manage health-related needs will improve Outcome: Progressing   Problem: Clinical Measurements: Goal: Will remain free from infection Outcome: Progressing   Problem: Activity: Goal: Risk for activity intolerance will decrease Outcome: Progressing   Problem: Coping: Goal: Level of anxiety will decrease Outcome: Progressing   Problem: Elimination: Goal: Will not experience complications related to bowel motility Outcome: Progressing   Problem: Pain Managment: Goal: General experience of comfort will improve Outcome: Progressing   Problem: Safety: Goal: Ability to remain free from injury will improve Outcome: Progressing   Problem: Skin Integrity: Goal: Risk for impaired skin integrity will decrease Outcome: Progressing

## 2019-09-05 NOTE — Plan of Care (Addendum)
  Problem: Skin Integrity: Goal: Risk for impaired skin integrity will decrease Outcome: Progressing   Problem: Safety: Goal: Ability to remain free from injury will improve Outcome: Progressing   Problem: Pain Managment: Goal: General experience of comfort will improve Outcome: Progressing   Problem: Activity: Goal: Risk for activity intolerance will decrease Outcome: Progressing    Pt able to ambulate with walker & assist X1 from chair to BR and Bed to BR very slow and steady gait. approximately 40-60 ft.  [place] Lumbar Brace maintained per MD order. Pt with c/o of migraine headache rated 8/10. Prn Fioricet given per MD orders. Follow up pain rated 3/10. Staples on Lower back C/D/I.

## 2019-09-06 LAB — TYPE AND SCREEN
ABO/RH(D): A NEG
Antibody Screen: POSITIVE
Unit division: 0
Unit division: 0

## 2019-09-06 LAB — BASIC METABOLIC PANEL
Anion gap: 10 (ref 5–15)
BUN: 10 mg/dL (ref 6–20)
CO2: 20 mmol/L — ABNORMAL LOW (ref 22–32)
Calcium: 8.4 mg/dL — ABNORMAL LOW (ref 8.9–10.3)
Chloride: 109 mmol/L (ref 98–111)
Creatinine, Ser: 1.05 mg/dL — ABNORMAL HIGH (ref 0.44–1.00)
GFR calc Af Amer: >60 mL/min (ref >60)
GFR calc non Af Amer: >60 mL/min (ref >60)
Glucose, Bld: 102 mg/dL — ABNORMAL HIGH (ref 70–99)
Potassium: 3.6 mmol/L (ref 3.5–5.1)
Sodium: 139 mmol/L (ref 135–145)

## 2019-09-06 LAB — BPAM RBC
Blood Product Expiration Date: 202101232359
Blood Product Expiration Date: 202101232359
Unit Type and Rh: 600
Unit Type and Rh: 600

## 2019-09-06 LAB — CBC WITH DIFFERENTIAL/PLATELET
Abs Immature Granulocytes: 0.02 10*3/uL (ref 0.00–0.07)
Basophils Absolute: 0 10*3/uL (ref 0.0–0.1)
Basophils Relative: 1 %
Eosinophils Absolute: 0.4 10*3/uL (ref 0.0–0.5)
Eosinophils Relative: 8 %
HCT: 27.6 % — ABNORMAL LOW (ref 36.0–46.0)
Hemoglobin: 9 g/dL — ABNORMAL LOW (ref 12.0–15.0)
Immature Granulocytes: 0 %
Lymphocytes Relative: 27 %
Lymphs Abs: 1.2 10*3/uL (ref 0.7–4.0)
MCH: 33.2 pg (ref 26.0–34.0)
MCHC: 32.6 g/dL (ref 30.0–36.0)
MCV: 101.8 fL — ABNORMAL HIGH (ref 80.0–100.0)
Monocytes Absolute: 0.3 10*3/uL (ref 0.1–1.0)
Monocytes Relative: 7 %
Neutro Abs: 2.6 10*3/uL (ref 1.7–7.7)
Neutrophils Relative %: 57 %
Platelets: 249 10*3/uL (ref 150–400)
RBC: 2.71 MIL/uL — ABNORMAL LOW (ref 3.87–5.11)
RDW: 12.6 % (ref 11.5–15.5)
WBC: 4.6 10*3/uL (ref 4.0–10.5)
nRBC: 0 % (ref 0.0–0.2)

## 2019-09-06 MED ORDER — FERROUS GLUCONATE 324 (38 FE) MG PO TABS
324.0000 mg | ORAL_TABLET | Freq: Two times a day (BID) | ORAL | Status: DC
  Administered 2019-09-06 – 2019-09-08 (×4): 324 mg via ORAL
  Filled 2019-09-06 (×5): qty 1

## 2019-09-06 NOTE — Plan of Care (Signed)

## 2019-09-06 NOTE — Progress Notes (Addendum)
Physical Therapy Treatment Patient Details Name: Vanessa Medina MRN: [id number] DOB: August 18, 1971 Today's Date: 09/06/2019    History of Present Illness Pt is a 49 y/o female s/p L2-L5 TLIF. PMH including but not limited to HTN, migraines, L THA in 2017.    PT Comments    Pt continuing to make steady progress with functional mobility. She was able to increase her distance ambulated and required less physical assistance overall. PT provided pt education re: back precautions, car transfers and a generalized walking program for pt to initiate upon d/c. Per pt, plan is to d/c home tomorrow. Will need stair training prior to d/c.   Pt would continue to benefit from skilled physical therapy services at this time while admitted and after d/c to address the below listed limitations in order to improve overall safety and independence with functional mobility.    Follow Up Recommendations  Home health PT;Supervision/Assistance - 24 hour     Equipment Recommendations  3in1 (PT)    Recommendations for Other Services       Precautions / Restrictions Precautions Precautions: Back;Fall Precaution Comments: reviewed 3/3 back precautions with pt throughout, including log roll technique Required Braces or Orthoses: Spinal Brace Spinal Brace: Lumbar corset;Applied in sitting position Restrictions Weight Bearing Restrictions: No    Mobility  Bed Mobility Overal bed mobility: Needs Assistance Bed Mobility: Sit to Sidelying;Rolling Rolling: Supervision       Sit to sidelying: Min guard General bed mobility comments: cueing for technique, use of bed rail, min guard for safety  Transfers Overall transfer level: Needs assistance Equipment used: Rolling walker (2 wheeled) Transfers: Sit to/from Stand Sit to Stand: Min guard         General transfer comment: min guard to rise into standing from recliner chair, good technique utilized  Ambulation/Gait Ambulation/Gait assistance: Hydrologist (Feet): 150 Feet Assistive device: Rolling walker (2 wheeled) Gait Pattern/deviations: Step-to pattern;Step-through pattern;Decreased step length - right;Decreased step length - left;Decreased stride length Gait velocity: still decreased but improved from previous sessions   General Gait Details: pt with greatly improved gait speed, ambulating a further distance as well. Use of RW and min guard for safety   Stairs             Wheelchair Mobility    Modified Rankin (Stroke Patients Only)       Balance Overall balance assessment: Needs assistance Sitting-balance support: Feet supported Sitting balance-Leahy Scale: Good     Standing balance support: Bilateral upper extremity supported Standing balance-Leahy Scale: Poor Standing balance comment: reliant on RW in standing                            Cognition Arousal/Alertness: Awake/alert Behavior During Therapy: WFL for tasks assessed/performed Overall Cognitive Status: Within Functional Limits for tasks assessed                                        Exercises      General Comments        Pertinent Vitals/Pain Pain Assessment: 0-10 Pain Score: 7  Pain Location: back Pain Descriptors / Indicators: Aching;Guarding Pain Intervention(s): Monitored during session;Repositioned    Home Living                      Prior Function  PT Goals (current goals can now be found in the care plan section) Acute Rehab PT Goals PT Goal Formulation: With patient Time For Goal Achievement: 09/17/19 Potential to Achieve Goals: Good Progress towards PT goals: Progressing toward goals    Frequency    Min 5X/week      PT Plan Current plan remains appropriate    Co-evaluation              AM-PAC PT "6 Clicks" Mobility   Outcome Measure  Help needed turning from your back to your side while in a flat bed without using bedrails?: A Little Help  needed moving from lying on your back to sitting on the side of a flat bed without using bedrails?: A Little Help needed moving to and from a bed to a chair (including a wheelchair)?: A Little Help needed standing up from a chair using your arms (e.g., wheelchair or bedside chair)?: A Little Help needed to walk in hospital room?: A Little Help needed climbing 3-5 steps with a railing? : A Little 6 Click Score: 18    End of Session Equipment Utilized During Treatment: Back brace Activity Tolerance: Patient tolerated treatment well Patient left: in bed;with call bell/phone within reach Nurse Communication: Mobility status;Precautions PT Visit Diagnosis: Other abnormalities of gait and mobility (R26.89);Pain;Difficulty in walking, not elsewhere classified (R26.2) Pain - part of body: (back)     Time: 5284-1324 PT Time Calculation (min) (ACUTE ONLY): 39 min  Charges:  $Gait Training: 23-37 mins $Therapeutic Activity: 8-22 mins                     Anastasio Champion, DPT  Acute Rehabilitation Services Pager [phone] Office [place] 09/06/2019, 12:17 PM

## 2019-09-06 NOTE — Progress Notes (Signed)
Patient ID: Vanessa Medina, female   DOB: Dec 24, 1970, 49 y.o.   MRN: [id number] No acute changes over the past 24 hours.  Has experienced itching as it relates to her narcotics use and pain medications.  Mobility is still significant limited by her pain.  Therapy is working with her.  Hopefully will be able to have her transition to home once she is safe mobilizing.

## 2019-09-07 NOTE — Progress Notes (Signed)
Subjective: 5 Days Post-Op Procedure(s) (LRB): TRANSFORAMINAL LUMBAR INTERBODY FUSION LEFT LUMBAR TWO THROUGH LUMBAR THREE, LUMBAR THREE THROUGH LUMBAR FOUR, LUMBAR FOUR THROUGH LUMBAR FIVE WITH PEDICLE SCREWS, RODS, CAGES, LOCAL BONE GRAFT, ALLOGRAFT BONE GRAFT, VIVIGEN (N/A) Patient reports pain as moderate to severe.  Nausea improved.   Objective: Vital signs in last 24 hours: Temp:  [98.2 F (36.8 C)-99.2 F (37.3 C)] 98.7 F (37.1 C) (01/03 0738) Pulse Rate:  [57-60] 60 (01/03 0738) Resp:  [16-18] 18 (01/03 0738) BP: (110-125)/(60-69) 119/69 (01/03 0738) SpO2:  [94 %-98 %] 96 % (01/03 0738)  Intake/Output from previous day: No intake/output data recorded. Intake/Output this shift: No intake/output data recorded.  Recent Labs    09/06/19 1258  HGB 9.0*   Recent Labs    09/06/19 1258  WBC 4.6  RBC 2.71*  HCT 27.6*  PLT 249   Recent Labs    09/06/19 1258  NA 139  K 3.6  CL 109  CO2 20*  BUN 10  CREATININE 1.05*  GLUCOSE 102*  CALCIUM 8.4*   No results for input(s): LABPT, INR in the last 72 hours.  Sensation intact distally Incision: dressing C/D/I Compartment soft Right ankle 5/5 strength with dorsi/plantarflexion against resistance . Left 4/5 strength with dorsi/plantar flexion against resistance.   Assessment/Plan: 5 Days Post-Op Procedure(s) (LRB): TRANSFORAMINAL LUMBAR INTERBODY FUSION LEFT LUMBAR TWO THROUGH LUMBAR THREE, LUMBAR THREE THROUGH LUMBAR FOUR, LUMBAR FOUR THROUGH LUMBAR FIVE WITH PEDICLE SCREWS, RODS, CAGES, LOCAL BONE GRAFT, ALLOGRAFT BONE GRAFT, VIVIGEN (N/A) Up with therapy  Possible discharge home tomorrow.       Linzee Depaul 09/07/2019, 8:07 AM

## 2019-09-07 NOTE — Plan of Care (Signed)
  Problem: Education: Goal: Knowledge of General Education information will improve Description: Including pain rating scale, medication(s)/side effects and non-pharmacologic comfort measures Outcome: Progressing   Problem: Activity: Goal: Risk for activity intolerance will decrease Outcome: Progressing   Problem: Coping: Goal: Level of anxiety will decrease Outcome: Progressing   Problem: Elimination: Goal: Will not experience complications related to bowel motility Outcome: Progressing   Problem: Pain Managment: Goal: General experience of comfort will improve Outcome: Progressing   Problem: Skin Integrity: Goal: Risk for impaired skin integrity will decrease Outcome: Progressing   Problem: Safety: Goal: Ability to remain free from injury will improve Outcome: Progressing

## 2019-09-07 NOTE — Progress Notes (Signed)
Occupational Therapy Treatment Patient Details Name: Vanessa Medina MRN: [id number] DOB: [birth year] Today's Date: 09/07/2019    History of present illness Pt is a 49 y/o female s/p L2-L5 TLIF. PMH including but not limited to HTN, migraines, L THA in 2017.   OT comments  Pt. Seen for skilled OT treatment session.  Reports feeling much better today than previous days.  Able to complete bed mobility, and ambulation to/from b.room for toileting task.  Reviewed LB dresssing techniques.  Pt. Able to return demo in bed bringing each leg into the bed but also reports having A/E to use prn from previous sx.  Moving well and is clear for d/c from acute OT standpoint.  Will alert OTR/L to sign off.      Follow Up Recommendations  Home health OT;Supervision/Assistance - 24 hour    Equipment Recommendations  3 in 1 bedside commode    Recommendations for Other Services      Precautions / Restrictions Precautions Precautions: Back;Fall Precaution Comments: reviewed 3/3 back precautions with pt throughout, including log roll technique Required Braces or Orthoses: Spinal Brace Spinal Brace: Lumbar corset;Applied in sitting position Restrictions Weight Bearing Restrictions: No       Mobility Bed Mobility Overal bed mobility: Needs Assistance Bed Mobility: Rolling;Sidelying to Sit Rolling: Supervision Sidelying to sit: Supervision       General bed mobility comments: cueing for technique, hob flat, cues not to use bed rail for more realistic home env. simulation  Transfers Overall transfer level: Needs assistance Equipment used: Rolling walker (2 wheeled) Transfers: Sit to/from [facility] Sit to Stand: Min guard Stand pivot transfers: Min guard       General transfer comment: min guard to rise into standing from eob, good technique utilized    Balance                                           ADL either performed or assessed with clinical  judgement   ADL Overall ADL's : Needs assistance/impaired               Lower Body Bathing Details (indicate cue type and reason): declined need for a/e review or purchase. states she has A/E from previous hip sx. and has no questions for use Upper Body Dressing : Set up;Sitting Upper Body Dressing Details (indicate cue type and reason): reviewed tech. for donning brace properly   Lower Body Dressing Details (indicate cue type and reason): declined need for a/e review or purchase. states she has A/E from previous hip sx. and has no questions for use.  Was able to return demo of bringing each leg into bed to reach feet to don LB garments if needed vs. A/e but also has a/e to use prn.   Toilet Transfer: Min guard;RW;Ambulation           Functional mobility during ADLs: Min guard;Rolling walker General ADL Comments: moving well today.  reports feeling much better than she has prvious days/sessions.     Vision       Perception     Praxis      Cognition Arousal/Alertness: Awake/alert Behavior During Therapy: WFL for tasks assessed/performed Overall Cognitive Status: Within Functional Limits for tasks assessed  Exercises     Shoulder Instructions       General Comments      Pertinent Vitals/ Pain       Pain Assessment: No/denies pain  Home Living                                          Prior Functioning/Environment              Frequency  Min 2X/week        Progress Toward Goals  OT Goals(current goals can now be found in the care plan section)  Progress towards OT goals: Goals met/education completed, patient discharged from [place] OT "6 Clicks" Daily Activity     Outcome Measure   Help from another person eating meals?: None Help from another person taking care of personal grooming?: A Little Help from another  person toileting, which includes using toliet, bedpan, or urinal?: A Lot Help from another person bathing (including washing, rinsing, drying)?: A Lot Help from another person to put on and taking off regular upper body clothing?: A Little Help from another person to put on and taking off regular lower body clothing?: A Lot 6 Click Score: 16    End of Session Equipment Utilized During Treatment: Rolling walker;Back brace  OT Visit Diagnosis: Unsteadiness on feet (R26.81);Muscle weakness (generalized) (M62.81);Pain   Activity Tolerance Patient tolerated treatment well   Patient Left Other (comment)(pt. left with PT heading to the gym)   Nurse Communication          Time: 8366-2947 OT Time Calculation (min): 15 min  Charges: OT General Charges $OT Visit: 1 Visit OT Treatments $Self Care/Home Management : 8-22 mins  Rico Junker M, COTA/L 09/07/2019, 12:12 PM

## 2019-09-07 NOTE — Progress Notes (Signed)
Physical Therapy Treatment Patient Details Name: Vanessa Medina MRN: [id number] DOB: 12-05-1970 Today's Date: 09/07/2019    History of Present Illness Pt is a 49 y/o female s/p L2-L5 TLIF. PMH including but not limited to HTN, migraines, L THA in 2017.    PT Comments    Continuing work on functional mobility and activity tolerance;  Noting overall good progress, increased amb distance, and stair training complete; On track for dc tomorrow  Follow Up Recommendations  Home health PT;Supervision/Assistance - 24 hour     Equipment Recommendations  3in1 (PT)    Recommendations for Other Services       Precautions / Restrictions Precautions Precautions: Back;Fall Precaution Comments: reviewed 3/3 back precautions with pt throughout, including log roll technique Required Braces or Orthoses: Spinal Brace Spinal Brace: Lumbar corset;Applied in sitting position Restrictions Weight Bearing Restrictions: No    Mobility  Bed Mobility Overal bed mobility: Needs Assistance Bed Mobility: Sit to Sidelying;Rolling Rolling: Supervision Sidelying to sit: Supervision     Sit to sidelying: Min guard General bed mobility comments: cueing for technique, hob flat, cues not to use bed rail for more realistic home env. simulation  Transfers Overall transfer level: Needs assistance Equipment used: Rolling walker (2 wheeled) Transfers: Sit to/from Stand Sit to Stand: Min guard Stand pivot transfers: Min guard       General transfer comment: Cues for hand placement; stood from recliner with good use of armrests  Ambulation/Gait Ambulation/Gait assistance: Min guard Gait Distance (Feet): 200 Feet Assistive device: Rolling walker (2 wheeled) Gait Pattern/deviations: Step-to pattern;Step-through pattern;Decreased step length - right;Decreased step length - left;Decreased stride length     General Gait Details: Continuing improvements   Stairs Stairs: Yes Stairs assistance: Min  guard Stair Management: One rail Left;One rail Right;Step to pattern Number of Stairs: 3(x2) General stair comments: Practiced 3 steps twice, employing 2 different methods: first, going up sideways with rail on L; and forward with Rail on R and Ue support on L; overall managing well; cues for sequence and technqiue   Wheelchair Mobility    Modified Rankin (Stroke Patients Only)       Balance     Sitting balance-Leahy Scale: Good       Standing balance-Leahy Scale: Poor                              Cognition Arousal/Alertness: Awake/alert Behavior During Therapy: WFL for tasks assessed/performed Overall Cognitive Status: Within Functional Limits for tasks assessed                                        Exercises      General Comments        Pertinent Vitals/Pain Pain Assessment: Faces Faces Pain Scale: Hurts little more Pain Location: back Pain Descriptors / Indicators: Aching;Guarding Pain Intervention(s): Monitored during session    Home Living                      Prior Function            PT Goals (current goals can now be found in the care plan section) Acute Rehab PT Goals Patient Stated Goal: decrease pain PT Goal Formulation: With patient Time For Goal Achievement: 09/17/19 Potential to Achieve Goals: Good Progress towards PT goals: Progressing toward goals    Frequency  Min 5X/week      PT Plan Current plan remains appropriate    Co-evaluation              AM-PAC PT "6 Clicks" Mobility   Outcome Measure  Help needed turning from your back to your side while in a flat bed without using bedrails?: A Little Help needed moving from lying on your back to sitting on the side of a flat bed without using bedrails?: None Help needed moving to and from a bed to a chair (including a wheelchair)?: None Help needed standing up from a chair using your arms (e.g., wheelchair or bedside chair)?:  None Help needed to walk in hospital room?: A Little Help needed climbing 3-5 steps with a railing? : A Little 6 Click Score: 21    End of Session Equipment Utilized During Treatment: Back brace Activity Tolerance: Patient tolerated treatment well Patient left: in bed;with call bell/phone within reach Nurse Communication: Mobility status;Precautions PT Visit Diagnosis: Other abnormalities of gait and mobility (R26.89);Pain;Difficulty in walking, not elsewhere classified (R26.2) Pain - part of body: (back)     Time: 5953-9672 PT Time Calculation (min) (ACUTE ONLY): 26 min  Charges:  $Gait Training: 23-37 mins                     Roney Marion, [place]  Acute Rehabilitation Services Pager [phone] Office [phone]    Colletta [place] 09/07/2019, 12:55 PM

## 2019-09-07 NOTE — Plan of Care (Signed)
  Problem: Education: Goal: Knowledge of General Education information will improve Description: Including pain rating scale, medication(s)/side effects and non-pharmacologic comfort measures Outcome: Progressing   Problem: Health Behavior/Discharge Planning: Goal: Ability to manage health-related needs will improve Outcome: Progressing   Problem: Activity: Goal: Risk for activity intolerance will decrease Outcome: Progressing   

## 2019-09-08 ENCOUNTER — Other Ambulatory Visit: Payer: Self-pay | Admitting: Specialist

## 2019-09-08 DIAGNOSIS — Z981 Arthrodesis status: Secondary | ICD-10-CM

## 2019-09-08 MED ORDER — OXYCODONE HCL 5 MG PO TABS: 5.0000 mg | ORAL_TABLET | ORAL | 0 refills | Status: DC | PRN

## 2019-09-08 MED ORDER — DOCUSATE SODIUM 100 MG PO CAPS: 100.0000 mg | ORAL_CAPSULE | Freq: Two times a day (BID) | ORAL | 0 refills | Status: DC

## 2019-09-08 MED ORDER — POLYETHYLENE GLYCOL 3350 17 G PO PACK: 17.0000 g | PACK | Freq: Every day | ORAL | 0 refills | Status: DC | PRN

## 2019-09-08 MED ORDER — FERROUS GLUCONATE 324 (38 FE) MG PO TABS: 324.0000 mg | ORAL_TABLET | Freq: Two times a day (BID) | ORAL | 1 refills | Status: DC

## 2019-09-08 MED ORDER — OXYCODONE HCL ER 10 MG PO T12A: 10.0000 mg | EXTENDED_RELEASE_TABLET | Freq: Two times a day (BID) | ORAL | 0 refills | Status: DC

## 2019-09-08 MED ORDER — BUTALBITAL-APAP-CAFFEINE 50-325-40 MG PO TABS: 2.0000 | ORAL_TABLET | Freq: Four times a day (QID) | ORAL | 0 refills | Status: DC | PRN

## 2019-09-08 MED ORDER — METHYLPREDNISOLONE 4 MG PO TBPK: ORAL_TABLET | ORAL | 0 refills | Status: DC

## 2019-09-08 MED ORDER — DIPHENHYDRAMINE HCL 25 MG PO CAPS: 25.0000 mg | ORAL_CAPSULE | Freq: Four times a day (QID) | ORAL | 0 refills | Status: DC | PRN

## 2019-09-08 MED ORDER — METHOCARBAMOL 500 MG PO TABS: 500.0000 mg | ORAL_TABLET | Freq: Three times a day (TID) | ORAL | 1 refills | Status: DC | PRN

## 2019-09-08 NOTE — TOC Transition Note (Signed)
Transition of Care [facility]) - CM/SW Discharge Note   Patient Details  Name: GRETTELL RANSDELL MRN: [id number] Date of Birth: [birth year]  [facility] of [facility] CM/SW Contact:  Atilano Median, LCSW Phone Number: 09/08/2019, 12:19 PM   Clinical Narrative:    Discharged home with home health services. Patient aware and agreeable to this plan. Per RN patient has all needed DME. No other needs at this time. Case closed to this CSW.   CSW signing off.    Final next level of care: [place] Barriers to Discharge: Barriers Resolved   Patient Goals and CMS Choice        Discharge Placement                       Discharge Plan and Services                          HH Arranged: PT [place] Agency: [facility] Date [place]: 09/08/19 Time [place]: 1219 Representative spoke with at [place]: [place] ([place]) Interventions     Readmission Risk Interventions No flowsheet data found.

## 2019-09-08 NOTE — Discharge Instructions (Signed)

## 2019-09-08 NOTE — Progress Notes (Signed)
Physical Therapy Treatment Patient Details Name: Vanessa Medina MRN: [id number] DOB: 1971-01-13 Today's Date: 09/08/2019    History of Present Illness Pt is a 49 y/o female s/p L2-L5 TLIF. PMH including but not limited to HTN, migraines, L THA in 2017.    PT Comments    Continuing work on functional mobility and activity tolerance;  Excellent progress, and able to walk more than household distance with RW; [place] voices confidence in her ability to manage;  OK for dc home from PT standpoint   Follow Up Recommendations  Home health PT;Supervision/Assistance - 24 hour     Equipment Recommendations  3in1 (PT)    Recommendations for Other Services       Precautions / Restrictions Precautions Precautions: Back;Fall Required Braces or Orthoses: Spinal Brace Spinal Brace: Lumbar corset;Applied in sitting position    Mobility  Bed Mobility                  Transfers   Equipment used: Rolling walker (2 wheeled) Transfers: Sit to/from Stand Sit to Stand: Supervision         General transfer comment: Good technqiue  Ambulation/Gait Ambulation/Gait assistance: Supervision Gait Distance (Feet): 550 Feet Assistive device: Rolling walker (2 wheeled) Gait Pattern/deviations: Step-through pattern;Decreased step length - right;Decreased step length - left;Decreased stride length     General Gait Details: Walking more than enough for household distance steadily with RW   Stairs             Wheelchair Mobility    Modified Rankin (Stroke Patients Only)       Balance                                            Cognition Arousal/Alertness: Awake/alert Behavior During Therapy: WFL for tasks assessed/performed Overall Cognitive Status: Within Functional Limits for tasks assessed                                        Exercises      General Comments General comments (skin integrity, edema, etc.): Pt described correct  technqiue for car transfer      Pertinent Vitals/Pain Pain Assessment: Faces Faces Pain Scale: Hurts a little bit Pain Location: back Pain Descriptors / Indicators: Aching Pain Intervention(s): Monitored during session    Home Living                      Prior Function            PT Goals (current goals can now be found in the care plan section) Acute Rehab PT Goals Patient Stated Goal: get to [place], and [place] at some point PT Goal Formulation: With patient Time For Goal Achievement: 09/17/19 Potential to Achieve Goals: Good Progress towards PT goals: Progressing toward goals    Frequency    Min 5X/week      PT Plan Current plan remains appropriate    Co-evaluation              AM-PAC PT "6 Clicks" Mobility   Outcome Measure  Help needed turning from your back to your side while in a flat bed without using bedrails?: None Help needed moving from lying on your back to sitting on the side of a  flat bed without using bedrails?: None Help needed moving to and from a bed to a chair (including a wheelchair)?: None Help needed standing up from a chair using your arms (e.g., wheelchair or bedside chair)?: None Help needed to walk in hospital room?: None Help needed climbing 3-5 steps with a railing? : A Little 6 Click Score: 23    End of Session Equipment Utilized During Treatment: Back brace Activity Tolerance: Patient tolerated treatment well Patient left: in chair;with call bell/phone within reach Nurse Communication: Mobility status;Precautions PT Visit Diagnosis: Other abnormalities of gait and mobility (R26.89);Pain;Difficulty in walking, not elsewhere classified (R26.2) Pain - part of body: (Back)     Time: 2761-4709 PT Time Calculation (min) (ACUTE ONLY): 19 min  Charges:  $Gait Training: 8-22 mins                     Roney Marion, PT  Acute Rehabilitation Services Pager [phone] Office [place] 09/08/2019, 9:14 AM

## 2019-09-08 NOTE — Progress Notes (Signed)
Patient discharging home. Discharge instructions explained to patient and she verbalized understanding. Packed all her personal belongings. No further questions or concerns voiced.

## 2019-09-08 NOTE — Plan of Care (Signed)

## 2019-09-08 NOTE — Progress Notes (Signed)
     Subjective: 6 Days Post-Op Procedure(s) (LRB): TRANSFORAMINAL LUMBAR INTERBODY FUSION LEFT LUMBAR TWO THROUGH LUMBAR THREE, LUMBAR THREE THROUGH LUMBAR FOUR, LUMBAR FOUR THROUGH LUMBAR FIVE WITH PEDICLE SCREWS, RODS, CAGES, LOCAL BONE GRAFT, ALLOGRAFT BONE GRAFT, VIVIGEN (N/A) Awake, alert, in good spirits. Had episode of nausea and vomiting in the interval and this is resolved. Also itching either due to narcotic intolerance or bed sheets and laundry detergents, bedding not switched. Voiding without difficulty.  OT has signed off and PT indicates she is in line to be discharged today. Patient is eager to go home today. Some left thigh discomfort and weakness, may be due to nerve root retraction or pressure of pad vs thigh on [place] table. She had previous chest discomfort due to pressure of chest pad.   Patient reports pain as moderate.    Objective:   VITALS:  Temp:  [97.9 F (36.6 C)-98.6 F (37 C)] 97.9 F (36.6 C) (01/04 0756) Pulse Rate:  [51-63] 51 (01/04 0756) Resp:  [17-19] 18 (01/04 0756) BP: (105-132)/(54-70) 124/65 (01/04 0756) SpO2:  [95 %-99 %] 98 % (01/04 0756)  Neurologically intact ABD soft Neurovascular intact Sensation intact distally Intact pulses distally Dorsiflexion/Plantar flexion intact Incision: dressing C/D/I and no drainage No cellulitis present Compartment soft   LABS Recent Labs    09/06/19 1258  HGB 9.0*  WBC 4.6  PLT 249   Recent Labs    09/06/19 1258  NA 139  K 3.6  CL 109  CO2 20*  BUN 10  CREATININE 1.05*  GLUCOSE 102*   No results for input(s): LABPT, INR in the last 72 hours.   Assessment/Plan: 6 Days Post-Op Procedure(s) (LRB): TRANSFORAMINAL LUMBAR INTERBODY FUSION LEFT LUMBAR TWO THROUGH LUMBAR THREE, LUMBAR THREE THROUGH LUMBAR FOUR, LUMBAR FOUR THROUGH LUMBAR FIVE WITH PEDICLE SCREWS, RODS, CAGES, LOCAL BONE GRAFT, ALLOGRAFT BONE GRAFT, VIVIGEN (N/A)  Advance diet Up with therapy Discharge home with home  health  Medrol dose pak for home. Meds to pharmacy [facility] on [address] [facility] in [place].   Basil Dess 09/08/2019, 9:02 AMPatient ID: Vanessa Medina, female   DOB: 11/24/1970, 49 y.o.   MRN: [id number]

## 2019-09-08 NOTE — Plan of Care (Signed)

## 2019-09-11 ENCOUNTER — Ambulatory Visit (INDEPENDENT_AMBULATORY_CARE_PROVIDER_SITE_OTHER): Payer: 59 | Admitting: Surgery

## 2019-09-11 ENCOUNTER — Encounter: Payer: Self-pay | Admitting: Surgery

## 2019-09-11 ENCOUNTER — Other Ambulatory Visit: Payer: Self-pay

## 2019-09-11 VITALS — BP 103/66 | HR 66 | Ht 64.0 in | Wt 266.0 lb

## 2019-09-11 DIAGNOSIS — Z981 Arthrodesis status: Secondary | ICD-10-CM

## 2019-09-11 MED ORDER — TAPENTADOL HCL 75 MG PO TABS: 75.0000 mg | ORAL_TABLET | ORAL | 0 refills | Status: DC | PRN

## 2019-09-11 NOTE — Progress Notes (Signed)
49 year old white female who is 10 days status post L2-L5 fusion returns.  States that she is doing okay.  She has been having a significant amount of problems with itching related to OxyContin and oxycodone given postop.  Patient states that she was advised to use Benadryl with this and this is not helping the itching.  She has home health PT.  Ambulating with a walker.  Exam Pleasant white female in no acute distress.  Surgical incision looks good.  Staples intact.  No drainage or signs of infection.  Neurologically intact.  Plan Advised patient to discontinue oxycodone and OxyContin.  I sent in a prescription for Nucynta.  Patient will follow-up with me next Wednesday for wound check and possible staple removal.  Also want her to call me on Monday to let me know how she is doing with the new pain medication.

## 2019-09-12 MED FILL — Sodium Chloride IV Soln 0.9%: INTRAVENOUS | Qty: 1000 | Status: AC

## 2019-09-12 MED FILL — Sodium Chloride Irrigation Soln 0.9%: Qty: 3000 | Status: AC

## 2019-09-12 MED FILL — Heparin Sodium (Porcine) Inj 1000 Unit/ML: INTRAMUSCULAR | Qty: 30 | Status: AC

## 2019-09-15 ENCOUNTER — Telehealth: Payer: Self-pay | Admitting: Surgery

## 2019-09-15 NOTE — Discharge Summary (Signed)
Patient ID: Vanessa Medina MRN: [id number] DOB/AGE: July 19, 1971 49 y.o.  Admit date: 09/02/2019 Discharge date: 09/15/2019  Admission Diagnoses:  Principal Problem:   DDD (degenerative disc disease), lumbar Active Problems:   Degenerative disc disease, lumbar   Discharge Diagnoses:  Principal Problem:   DDD (degenerative disc disease), lumbar Active Problems:   Degenerative disc disease, lumbar  status post Procedure(s): TRANSFORAMINAL LUMBAR INTERBODY FUSION LEFT LUMBAR TWO THROUGH LUMBAR THREE, LUMBAR THREE THROUGH LUMBAR FOUR, LUMBAR FOUR THROUGH LUMBAR FIVE WITH PEDICLE SCREWS, RODS, CAGES, LOCAL BONE GRAFT, ALLOGRAFT BONE GRAFT, VIVIGEN  Past Medical History:  Diagnosis Date  . Anemia   . Cervical cancer ([place])   . Family history of adverse reaction to anesthesia     " my mother takes a long time time wake up"  . GERD (gastroesophageal reflux disease)   . Headache    migraine  . History of shingles   . Hypertension   . Migraine   . Multiple allergies   . Obesity   . Osteoarthritis    left hip  . PONV (postoperative nausea and vomiting)   . Sleep apnea    does not wear CPAP  . Wears glasses     Surgeries: Procedure(s): TRANSFORAMINAL LUMBAR INTERBODY FUSION LEFT LUMBAR TWO THROUGH LUMBAR THREE, LUMBAR THREE THROUGH LUMBAR FOUR, LUMBAR FOUR THROUGH LUMBAR FIVE WITH PEDICLE SCREWS, RODS, CAGES, LOCAL BONE GRAFT, ALLOGRAFT BONE GRAFT, VIVIGEN on 09/02/2019   Consultants:   Discharged Condition: Improved  Hospital Course: [name] is an 49 y.o. female who was admitted 09/02/2019 for operative treatment of DDD (degenerative disc disease), lumbar. Patient failed conservative treatments (please see the history and physical for the specifics) and had severe unremitting pain that affects sleep, daily activities and work/hobbies. After pre-op clearance, the patient was taken to the operating room on 09/02/2019 and underwent  Procedure(s): TRANSFORAMINAL LUMBAR  INTERBODY FUSION LEFT LUMBAR TWO THROUGH LUMBAR THREE, LUMBAR THREE THROUGH LUMBAR FOUR, LUMBAR FOUR THROUGH LUMBAR FIVE WITH PEDICLE SCREWS, RODS, CAGES, LOCAL BONE GRAFT, ALLOGRAFT BONE GRAFT, VIVIGEN.    Patient was given perioperative antibiotics:  Anti-infectives (From admission, onward)   Start     Dose/Rate Route Frequency Ordered Stop   09/02/19 1845  ceFAZolin (ANCEF) IVPB 2g/100 mL premix     2 g 200 mL/hr over 30 Minutes Intravenous Every 8 hours 09/02/19 1836 09/03/19 0413   09/02/19 0630  ceFAZolin (ANCEF) 3 g in dextrose 5 % 50 mL IVPB     3 g 100 mL/hr over 30 Minutes Intravenous To [facility] 09/01/19 0738 09/02/19 1157       Patient was given sequential compression devices and early ambulation to prevent DVT.   Patient benefited maximally from hospital stay and there were no complications. At the time of discharge, the patient was urinating/moving their bowels without difficulty, tolerating a regular diet, pain is controlled with oral pain medications and they have been cleared by PT/OT.   Recent vital signs: No data found.   Recent laboratory studies: No results for input(s): WBC, HGB, HCT, PLT, NA, K, CL, CO2, BUN, CREATININE, GLUCOSE, INR, CALCIUM in the last 72 hours.  Invalid input(s): PT, 2   Discharge Medications:   Allergies as of 09/08/2019      Reactions   Shellfish Allergy Anaphylaxis   Meperidine Hcl Other (See Comments)   BRADYCARDIA   Morphine Other (See Comments)   BRADYCARDIA   Bee Pollen Other (See Comments)   Unknown   Ivp Dye [iodinated  Diagnostic Agents] Other (See Comments)   Swelling    Codeine Nausea And Vomiting   Oxycodone-acetaminophen Itching   Pentazocine Lactate Nausea And Vomiting   REACTION: vomiting with [place] NX   Propoxyphene N-acetaminophen Nausea And Vomiting      Medication List    STOP taking these medications   diclofenac 75 MG EC tablet Commonly known as: VOLTAREN   traMADol 50 MG tablet Commonly known  as: ULTRAM     TAKE these medications   butalbital-acetaminophen-caffeine 50-325-40 MG tablet Commonly known as: FIORICET Take 2 tablets by mouth every 6 (six) hours as needed for headache.   CALTRATE 600+D PO Take 1 tablet by mouth daily.   cyanocobalamin 1000 MCG/ML injection Commonly known as: (VITAMIN B-12) Inject 1,000 mcg into the muscle every 30 (thirty) days.   diphenhydrAMINE 25 mg capsule Commonly known as: BENADRYL Take 1 capsule (25 mg total) by mouth every 6 (six) hours as needed for itching.   docusate sodium 100 MG capsule Commonly known as: COLACE Take 1 capsule (100 mg total) by mouth 2 (two) times daily.   ferrous gluconate 324 MG tablet Commonly known as: FERGON Take 1 tablet (324 mg total) by mouth 2 (two) times daily with a meal.   gabapentin 300 MG capsule Commonly known as: NEURONTIN Take 1 capsule (300 mg total) by mouth at bedtime.   methocarbamol 500 MG tablet Commonly known as: ROBAXIN Take 1 tablet (500 mg total) by mouth every 8 (eight) hours as needed for muscle spasms.   methylPREDNISolone 4 MG Tbpk tablet Commonly known as: MEDROL DOSEPAK Take as directed, 6 day dose pak   midodrine 10 MG tablet Commonly known as: PROAMATINE Take 1 tablet (10 mg total) by mouth 3 (three) times daily.   multivitamin with minerals tablet Take 1 tablet by mouth daily.   polyethylene glycol 17 g packet Commonly known as: MIRALAX / GLYCOLAX Take 17 g by mouth daily as needed for mild constipation.   pramipexole 0.125 MG tablet Commonly known as: MIRAPEX Take 0.5 mg by mouth at bedtime. Pt takes 4 tablets at bedtime   topiramate 25 MG tablet Commonly known as: TOPAMAX Take 25 mg by mouth 2 (two) times daily.       Diagnostic Studies: DG Lumbar Spine Complete  Result Date: 09/02/2019 CLINICAL DATA:  Lumbar fusion. EXAM: LUMBAR SPINE - COMPLETE 4+ VIEW; DG C-ARM 1-60 MIN COMPARISON:  Lumbar radiographs 07/24/2019 FINDINGS: Intraoperative  fluoroscopic spot images demonstrate pedicle screws, posterior rods and interbody fusion devices at L2-3, L3-4 and L4-5. Good position and alignment without complicating features. IMPRESSION: L2-L5 posterior and interbody fusion. Electronically Signed   By: Marijo Sanes M.D.   On: 09/02/2019 15:48   DG C-Arm 1-60 Min  Result Date: 09/02/2019 CLINICAL DATA:  Lumbar fusion. EXAM: LUMBAR SPINE - COMPLETE 4+ VIEW; DG C-ARM 1-60 MIN COMPARISON:  Lumbar radiographs 07/24/2019 FINDINGS: Intraoperative fluoroscopic spot images demonstrate pedicle screws, posterior rods and interbody fusion devices at L2-3, L3-4 and L4-5. Good position and alignment without complicating features. IMPRESSION: L2-L5 posterior and interbody fusion. Electronically Signed   By: Marijo Sanes M.D.   On: 09/02/2019 15:48    Discharge Instructions    Call MD / Call 911   Complete by: As directed    If you experience chest pain or shortness of breath, CALL 911 and be transported to the hospital emergency room.  If you develope a fever above 101 F, pus (white drainage) or increased drainage or redness at the  wound, or calf pain, call your surgeon's office.   Constipation Prevention   Complete by: As directed    Drink plenty of fluids.  Prune juice may be helpful.  You may use a stool softener, such as Colace (over the counter) 100 mg twice a day.  Use MiraLax (over the counter) for constipation as needed.   Diet - low sodium heart healthy   Complete by: As directed    Discharge instructions   Complete by: As directed    Call if there is increasing drainage, fever greater than 101.5, severe head aches, and worsening nausea or light sensitivity. If shortness of breath, bloody cough or chest tightness or pain go to an emergency room. No lifting greater than 10 lbs. Avoid bending, stooping and twisting. Use brace when sitting and out of bed even to go to bathroom. Walk in house for first 2 weeks then may start to get out slowly  increasing distances up to one mile by 4-6 weeks post op. After 5 days may shower and change dressing following bathing with shower.When bathing remove the brace shower and replace brace before getting out of the shower. If drainage, keep dry dressing and do not bathe the incision, use an moisture impervious dressing. Please call and return for scheduled follow up appointment 2 weeks from the time of surgery.   Driving restrictions   Complete by: As directed    No driving for 6 weeks   Increase activity slowly as tolerated   Complete by: As directed    Lifting restrictions   Complete by: As directed    No lifting for 8 weeks      Follow-up Information    Jessy Oto, MD Follow up in 1 week(s).   Specialty: Orthopedic Surgery Why: For wound re-check in 7-10 days Contact information: [place] [address] [phone]        [facility].   Specialty: [place] Why: Somone will call you to schedule a visit  Contact information: [place] [address] [phone]           Discharge Plan:  discharge to home  Disposition:     Signed: Benjiman Core  09/15/2019, 3:15 PM

## 2019-09-15 NOTE — Telephone Encounter (Signed)
Please call her back.Her insurance denied Nucynta. Wants to know if you can call insurance company and try to disptue this.  [phone]  Pt# [phone]

## 2019-09-15 NOTE — Telephone Encounter (Signed)
Nitka patient 

## 2019-09-15 NOTE — Telephone Encounter (Signed)
I called patient back, I advised that it req'd PA to be done and this was done on Friday, and it shows that it was approved on [phone]

## 2019-09-18 ENCOUNTER — Ambulatory Visit (INDEPENDENT_AMBULATORY_CARE_PROVIDER_SITE_OTHER): Payer: 59 | Admitting: Surgery

## 2019-09-18 ENCOUNTER — Other Ambulatory Visit: Payer: Self-pay

## 2019-09-18 ENCOUNTER — Encounter: Payer: Self-pay | Admitting: Surgery

## 2019-09-18 VITALS — BP 112/70 | HR 81

## 2019-09-18 DIAGNOSIS — Z981 Arthrodesis status: Secondary | ICD-10-CM

## 2019-09-18 NOTE — Progress Notes (Signed)
49 year old white female who is 2-week status post L2-L5 fusion returns for wound check and staple removal.  Patient was able to get prescription for Nucynta approved through her insurance.  As I previously documented she was having significant problems with itching from the OxyContin and oxycodone.  States that she has not had any itching with the Nucynta and it is controlling her pain reasonably well.  States that she has a history of migraines and she has been having worsening headaches over the last couple weeks.  She is not currently followed by a neurologist but has seen 1 in the past here in [place].  States that her primary care physician Dr. [place] Pink at [facility] clinic has been treating the migraines.  Exam Lumbar wound looks good.  Staples removed and Steri-Strips applied.  No drainage or signs of infection.   Plan Patient will follow-up with Dr. Louanne Skye in 4 weeks for recheck and we will get AP and lateral x-rays lumbar spine at that time.  We did contact patient's primary care physician's office to see about getting her in for evaluation of her migraines this week.  A PA could do a tele-visit tomorrow and patient also had the option to see Dr. Kary Kos on Monday.  She elected to wait to see Dr. Kary Kos next week.  Patient was also advised that she could go to the urgent care for treatment.  She will let us know if she needs a refill on Nucynta.

## 2019-09-23 ENCOUNTER — Other Ambulatory Visit: Payer: Self-pay | Admitting: Radiology

## 2019-09-23 MED ORDER — GABAPENTIN 300 MG PO CAPS: 300.0000 mg | ORAL_CAPSULE | Freq: Every day | ORAL | 2 refills | Status: DC

## 2019-09-23 NOTE — Telephone Encounter (Signed)
Pharmacy sent request.

## 2019-10-06 ENCOUNTER — Encounter: Payer: Self-pay | Admitting: Specialist

## 2019-10-09 ENCOUNTER — Other Ambulatory Visit: Payer: Self-pay | Admitting: Specialist

## 2019-10-09 MED ORDER — AMOXICILLIN-POT CLAVULANATE 875-125 MG PO TABS: ORAL_TABLET | ORAL | 0 refills | Status: DC

## 2019-10-16 ENCOUNTER — Ambulatory Visit (INDEPENDENT_AMBULATORY_CARE_PROVIDER_SITE_OTHER): Payer: 59

## 2019-10-16 ENCOUNTER — Encounter: Payer: Self-pay | Admitting: Specialist

## 2019-10-16 ENCOUNTER — Ambulatory Visit: Payer: 59 | Admitting: Specialist

## 2019-10-16 ENCOUNTER — Ambulatory Visit (INDEPENDENT_AMBULATORY_CARE_PROVIDER_SITE_OTHER): Payer: 59 | Admitting: Specialist

## 2019-10-16 ENCOUNTER — Other Ambulatory Visit: Payer: Self-pay

## 2019-10-16 VITALS — BP 113/75 | HR 74 | Ht 64.0 in | Wt 266.0 lb

## 2019-10-16 DIAGNOSIS — Z981 Arthrodesis status: Secondary | ICD-10-CM | POA: Diagnosis not present

## 2019-10-16 MED ORDER — GABAPENTIN 300 MG PO CAPS: 300.0000 mg | ORAL_CAPSULE | Freq: Two times a day (BID) | ORAL | 3 refills | Status: DC

## 2019-10-16 NOTE — Progress Notes (Signed)
Post-Op Visit Note   Patient: Vanessa Medina           Date of Birth: 1971/08/17           MRN: [id number] Visit Date: 10/16/2019 PCP: [place] Pink, MD   Assessment & Plan:6 weeks post  L2 to L5 fusion Chief Complaint:  Chief Complaint  Patient presents with  . Lower Back - Routine Post Op  Incision is healed SLR is negative Weakness left quadriceps and left foot dorsiflexion. Stopped PT on Monday. Visit Diagnoses:  1. S/P lumbar spinal fusion     Plan: Avoid frequent bending and stooping  No lifting greater than 10 lbs. May use ice or moist heat for pain. Weight loss is of benefit. Wear the brace when out of bed and may shower without the brace.  Exercise is important to improve your indurance and does allow people to function better inspite of back pain.    Follow-Up Instructions: No follow-ups on file.   Orders:  Orders Placed This Encounter  Procedures  . XR Lumbar Spine 2-3 Views   No orders of the defined types were placed in this encounter.   Imaging: XR Lumbar Spine 2-3 Views  Result Date: 10/16/2019 L2 to L5 TLIFs with posterior pedicle screws and rods with interbody cages L2-3, L3-4 and L4-5. Lordosis is 33-36 degrees L1 to S1. No abnormality on plain radiographs   PMFS History: Patient Active Problem List   Diagnosis Date Noted  . DDD (degenerative disc disease), lumbar 09/02/2019    Priority: High    Class: Chronic  . Degenerative disc disease, lumbar 09/02/2019  . Chest tightness 09/23/2018  . Bradycardia 09/23/2018  . Labile blood pressure 03/08/2018  . Chronic low back pain 09/03/2017  . History of total hip replacement, left 01/26/2016  . EDEMA 04/28/2008  . LEG PAIN, BILATERAL 12/16/2007  . CERVICAL CANCER 07/02/2007  . MORBID OBESITY 07/02/2007  . DEPRESSION 07/02/2007  . COMMON MIGRAINE 07/02/2007  . ALLERGIC RHINITIS 07/02/2007  . ASTHMA 07/02/2007  . GERD 07/02/2007  . ELEVATED BLOOD PRESSURE WITHOUT DIAGNOSIS OF  HYPERTENSION 07/02/2007   Past Medical History:  Diagnosis Date  . Anemia   . Cervical cancer ([place])   . Family history of adverse reaction to anesthesia     " my mother takes a long time time wake up"  . GERD (gastroesophageal reflux disease)   . Headache    migraine  . History of shingles   . Hypertension   . Migraine   . Multiple allergies   . Obesity   . Osteoarthritis    left hip  . PONV (postoperative nausea and vomiting)   . Sleep apnea    does not wear CPAP  . Wears glasses     Family History  Problem Relation Age of Onset  . Renal Disease Mother   . Hypertension Mother   . Sudden Cardiac Death Mother   . Heart failure Mother   . Valvular heart disease Mother   . Heart disease Mother   . Stroke Brother   . Heart attack Brother 24  . Diabetes Other   . Breast cancer Cousin        paternal side    Past Surgical History:  Procedure Laterality Date  . ABDOMINAL HYSTERECTOMY    . APPENDECTOMY    . BILIOPANCREATIC DIVERSION     with duodenal switch laparoscopic   . CHOLECYSTECTOMY    . DIAGNOSTIC LAPAROSCOPY     LOA  .  ESOPHAGOGASTRODUODENOSCOPY (EGD) WITH PROPOFOL N/A 07/25/2017   Procedure: ESOPHAGOGASTRODUODENOSCOPY (EGD) WITH PROPOFOL;  Surgeon: Manya Silvas, MD;  Location: [facility] ENDOSCOPY;  Service: Endoscopy;  Laterality: N/A;  . KNEE ARTHROSCOPY    . LAPAROSCOPIC GASTRIC SLEEVE RESECTION WITH HIATAL HERNIA REPAIR    . TONSILLECTOMY    . TOTAL HIP ARTHROPLASTY Left 01/26/2016   Procedure: LEFT TOTAL HIP ARTHROPLASTY ANTERIOR APPROACH;  Surgeon: Leandrew Koyanagi, MD;  Location: [place];  Service: Orthopedics;  Laterality: Left;   Social History   Occupational History  . Not on file  Tobacco Use  . Smoking status: Never Smoker  . Smokeless tobacco: Never Used  Substance and Sexual Activity  . Alcohol use: Yes    Comment: occasional wine  . Drug use: No  . Sexual activity: Not on file

## 2019-10-16 NOTE — Patient Instructions (Signed)
Plan: Avoid frequent bending and stooping  No lifting greater than 10 lbs. May use ice or moist heat for pain. Weight loss is of benefit. Wear the brace when out of bed and may shower without the brace.  Exercise is important to improve your indurance and does allow people to function better inspite of back pain.

## 2019-10-17 ENCOUNTER — Ambulatory Visit: Payer: 59 | Admitting: Specialist

## 2019-10-22 ENCOUNTER — Ambulatory Visit: Payer: 59 | Admitting: Physical Therapy

## 2019-10-22 ENCOUNTER — Other Ambulatory Visit: Payer: Self-pay

## 2019-10-22 ENCOUNTER — Encounter: Payer: Self-pay | Admitting: Physical Therapy

## 2019-10-22 DIAGNOSIS — R2689 Other abnormalities of gait and mobility: Secondary | ICD-10-CM | POA: Diagnosis not present

## 2019-10-22 DIAGNOSIS — M5442 Lumbago with sciatica, left side: Secondary | ICD-10-CM | POA: Diagnosis not present

## 2019-10-22 DIAGNOSIS — R262 Difficulty in walking, not elsewhere classified: Secondary | ICD-10-CM

## 2019-10-22 DIAGNOSIS — M6281 Muscle weakness (generalized): Secondary | ICD-10-CM | POA: Diagnosis not present

## 2019-10-22 NOTE — Therapy (Signed)
[facility] [address] [place], [place], [address] Phone: [phone]   Fax:  [phone]  Physical Therapy Evaluation  Patient Details  Name: Vanessa Medina MRN: [id number] Date of Birth: 1971/01/04 Referring Provider (PT): Jessy Oto, MD   Encounter Date: 10/22/2019  PT End of Session - 10/22/19 2018    Visit Number  1    Number of Visits  12    Date for PT Re-Evaluation  12/03/19    PT Start Time  0845    PT Stop Time  0922    PT Time Calculation (min)  37 min    Equipment Utilized During Treatment  Back brace    Activity Tolerance  Patient tolerated treatment well    Behavior During Therapy  [facility] for tasks assessed/performed       Past Medical History:  Diagnosis Date  . Anemia   . Cervical cancer ([place])   . Family history of adverse reaction to anesthesia     " my mother takes a long time time wake up"  . GERD (gastroesophageal reflux disease)   . Headache    migraine  . History of shingles   . Hypertension   . Migraine   . Multiple allergies   . Obesity   . Osteoarthritis    left hip  . PONV (postoperative nausea and vomiting)   . Sleep apnea    does not wear CPAP  . Wears glasses     Past Surgical History:  Procedure Laterality Date  . ABDOMINAL HYSTERECTOMY    . APPENDECTOMY    . BILIOPANCREATIC DIVERSION     with duodenal switch laparoscopic   . CHOLECYSTECTOMY    . DIAGNOSTIC LAPAROSCOPY     LOA  . ESOPHAGOGASTRODUODENOSCOPY (EGD) WITH PROPOFOL N/A 07/25/2017   Procedure: ESOPHAGOGASTRODUODENOSCOPY (EGD) WITH PROPOFOL;  Surgeon: Manya Silvas, MD;  Location: [facility] ENDOSCOPY;  Service: Endoscopy;  Laterality: N/A;  . KNEE ARTHROSCOPY    . LAPAROSCOPIC GASTRIC SLEEVE RESECTION WITH HIATAL HERNIA REPAIR    . TONSILLECTOMY    . TOTAL HIP ARTHROPLASTY Left 01/26/2016   Procedure: LEFT TOTAL HIP ARTHROPLASTY ANTERIOR APPROACH;  Surgeon: Leandrew Koyanagi, MD;  Location: [place];  Service: Orthopedics;   Laterality: Left;    There were no vitals filed for this visit.   Subjective Assessment - 10/22/19 0849    Subjective  Pt is a 49 y/o female s/p L 2-5 spinal fusion on 09/01/20.  Pt had HHPT which consisted of walking, squats and SLR, and so MD is requesting OPPT.  She's having some consistent discomfort into LLE and residual weakness.    Pertinent History  hx cervical ca, obesity, Lt THA, HTN    Limitations  Standing;Walking;Lifting    How long can you stand comfortably?  1-2 min    How long can you walk comfortably?  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  Yes    Pain Score  3    up to 9/10; at best 0/10   Pain Location  Back    Pain Orientation  Lower;Left    Pain Descriptors / Indicators  Aching;Sharp    Pain Type  Acute pain;Surgical pain    Pain Radiating Towards  LLE to knee    Pain Onset  More than a month ago    Pain Frequency  Constant  Aggravating Factors   standing, walking, repositioining, sitting    Pain Relieving Factors  medication, heat         [facility] PT Assessment - 10/22/19 0855      Assessment   Medical Diagnosis  Z98.1 (ICD-10-CM) - S/P lumbar spinal fusion    Referring Provider (PT)  Jessy Oto, MD    Onset Date/Surgical Date  09/02/19    Hand Dominance  Right    Next MD Visit  11/24/19    Prior Therapy  HHPT; none prior to surgery      Precautions   Precautions  Back    Precaution Comments  no lifting > 10#, no core strengthening for at least 6 more weeks    Required Braces or Orthoses  Spinal Brace    Spinal Brace  Lumbar corset;Other (comment)   when up/awake     Restrictions   Weight Bearing Restrictions  No      Balance Screen   Has the patient fallen in the past 6 months  No   1 near fall   Has the patient had a decrease in activity level because of a fear of falling?   No    Is the patient reluctant to leave their home because of a fear of  falling?   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[employer]; was going to [employer] prior to surgery      Cognition   Overall Cognitive Status  Within Functional Limits for tasks assessed      ROM / Strength   AROM / PROM / Strength  Strength;AROM      AROM   Overall AROM Comments  deferred due to post op status      Strength   Overall Strength  Deficits    Overall Strength Comments  see functional tests -core and LLE weakness present; Lt dorsiflexion 3+/5      Palpation   Palpation comment  tenderness present along lateral Lt thigh along IT band      Transfers   Five time sit to stand comments   65.44 sec without UE support      Ambulation/Gait   Ambulation/Gait  Yes    Ambulation/Gait Assistance  6: Modified independent (Device/Increase time)    Assistive device  Straight cane    Gait Pattern  Decreased stance time - left;Decreased step length - right;Decreased weight shift to left;Wide base of support   weight shifted to Rt   Gait velocity  0.94 ft/sec   32.8  ft= 34.87 sec               Objective measurements completed on examination: See above findings.      [facility] Adult PT Treatment/Exercise - 10/22/19 0855      Exercises   Exercises  Other Exercises  Other Exercises   demonstrated HEP to pt - she verbalized understanding; see pt instructions for details             PT Education - 10/22/19 2017    Education Details  HEP, DN    Person(s) Educated  Patient    Methods   Explanation;Demonstration;Handout    Comprehension  Verbalized understanding;Returned demonstration;Need further instruction          PT Long Term Goals - 10/22/19 2024      PT LONG TERM GOAL #1   Title  independent with HEP    Status  New    Target Date  12/03/19      PT LONG TERM GOAL #2   Title  amb without AD independently with pain < 4/10 for improved function    Status  New    Target Date  12/03/19      PT LONG TERM GOAL #3   Title  improve gait velocity to > 1.8 ft/sec for improved mobility and decreased fall risk    Status  New    Target Date  12/03/19      PT LONG TERM GOAL #4   Title  improve 5x STS to < 45 sec for improved function    Status  New    Target Date  12/03/19      PT LONG TERM GOAL #5   Title  report standing and walking tolerance to at least 20 min without increase in pain for improved function    Status  New    Target Date  12/03/19             Plan - 10/22/19 2019    Clinical Impression Statement  Pt is a 49 y/o female who presents to OPPT s/p L2-5 lumbar fusion on 09/02/19.  Pt demonstrates decreased strength, gait abnormalties and decreased balance as well as residual pain affecting functional mobility.  Pt will benefit from PT to address deficits listed.    Personal Factors and Comorbidities  Comorbidity 3+    Comorbidities  hx cervical ca, obesity, Lt THA, HTN    Examination-Activity Limitations  Sit;Stand;Carry;Lift;Locomotion Level;Transfers    Examination-Participation Restrictions  Other   occupation   Stability/Clinical Decision Making  Evolving/Moderate complexity    Clinical Decision Making  Moderate    Rehab Potential  Good    PT Frequency  2x / week    PT Duration  6 weeks    PT Treatment/Interventions  ADLs/Self Care Home Management;Cryotherapy;Electrical Stimulation;Moist Heat;Traction;Balance training;Therapeutic exercise;Therapeutic activities;Functional mobility training;Stair training;Gait training;Neuromuscular  re-education;Patient/family education;Manual techniques;Taping;Dry needling;Passive range of motion    PT Next Visit Plan  review HEP, progress strengthening; give hamstring/ITB/piriformis stretches; functional moiblity and endurance activities    PT Home Exercise Plan  Access Code: [email or web address]    Consulted and Agree with Plan of Care  Patient       Patient will benefit from skilled therapeutic intervention in order to improve the following deficits and impairments:  Abnormal gait, Decreased strength, Increased fascial restricitons, Increased muscle spasms, Pain, Difficulty walking, Decreased mobility, Decreased balance, Impaired flexibility, Postural dysfunction  Visit Diagnosis: Acute left-sided low back pain with left-sided sciatica - Plan: PT plan of care cert/re-cert  Muscle weakness (generalized) - Plan: PT plan of care cert/re-cert  Other abnormalities of gait and mobility - Plan: PT plan of care cert/re-cert  Difficulty in walking, not elsewhere classified - Plan: PT plan of care cert/re-cert     Problem List Patient Active Problem List  Diagnosis Date Noted  . DDD (degenerative disc disease), lumbar 09/02/2019    Class: Chronic  . Degenerative disc disease, lumbar 09/02/2019  . Chest tightness 09/23/2018  . Bradycardia 09/23/2018  . Labile blood pressure 03/08/2018  . Chronic low back pain 09/03/2017  . History of total hip replacement, left 01/26/2016  . EDEMA 04/28/2008  . LEG PAIN, BILATERAL 12/16/2007  . CERVICAL CANCER 07/02/2007  . MORBID OBESITY 07/02/2007  . DEPRESSION 07/02/2007  . COMMON MIGRAINE 07/02/2007  . ALLERGIC RHINITIS 07/02/2007  . ASTHMA 07/02/2007  . GERD 07/02/2007  . ELEVATED BLOOD PRESSURE WITHOUT DIAGNOSIS OF HYPERTENSION 07/02/2007      Laureen Abrahams, PT, DPT 10/22/19 8:31 PM     [facility] Physical Therapy [address] [place], [place], [address] Phone: [phone]   Fax:  [phone]  Name:  Vanessa Medina MRN: [id number] Date of Birth: [birth year]

## 2019-10-22 NOTE — Patient Instructions (Signed)
Access Code: [email or web address]  URL: [email or web address]  Date: 10/22/2019  Prepared by: Faustino Congress   Exercises Mini Squat with Counter Support - 10 reps - 1 sets - 2x daily - 7x weekly Sit to Stand - 10 reps - 1 sets - 2x daily - 7x weekly Standing Weight Shift Side to Side - 10 reps - 1 sets - 5-10 sec hold - 2x daily - 7x weekly Standing Toe Raises at Chair - 10 reps - 1 sets - 2x daily - 7x weekly Standing Hip Abduction - 10 reps - 1 sets - 2x daily - 7x weekly Patient Education Trigger Point Dry Needling

## 2019-10-24 ENCOUNTER — Other Ambulatory Visit: Payer: Self-pay

## 2019-10-24 ENCOUNTER — Encounter: Payer: Self-pay | Admitting: Physical Therapy

## 2019-10-24 ENCOUNTER — Ambulatory Visit (INDEPENDENT_AMBULATORY_CARE_PROVIDER_SITE_OTHER): Payer: 59 | Admitting: Physical Therapy

## 2019-10-24 DIAGNOSIS — M6281 Muscle weakness (generalized): Secondary | ICD-10-CM | POA: Diagnosis not present

## 2019-10-24 DIAGNOSIS — M5442 Lumbago with sciatica, left side: Secondary | ICD-10-CM

## 2019-10-24 DIAGNOSIS — R2689 Other abnormalities of gait and mobility: Secondary | ICD-10-CM

## 2019-10-24 DIAGNOSIS — R262 Difficulty in walking, not elsewhere classified: Secondary | ICD-10-CM

## 2019-10-24 MED ORDER — TAPENTADOL HCL 75 MG PO TABS: 75.0000 mg | ORAL_TABLET | ORAL | 0 refills | Status: DC | PRN

## 2019-10-24 NOTE — Patient Instructions (Signed)
Access Code: [email or web address]  URL: [email or web address]  Date: 10/24/2019  Prepared by: Hilda Blades   Exercises Mini Squat with Counter Support - 10 reps - 1 sets - 2x daily - 7x weekly Sit to Stand - 10 reps - 1 sets - 2x daily - 7x weekly Standing Weight Shift Side to Side - 10 reps - 1 sets - 5-10 sec hold - 2x daily - 7x weekly Standing Toe Raises at Chair - 10 reps - 1 sets - 2x daily - 7x weekly Standing Heel Raise with Support - 10 reps - 1 sets - 2x daily - 7x weekly Standing Hip Abduction - 10 reps - 1 sets - 2x daily - 7x weekly Standing Hip Extension - 10 reps - 1 sets - 2x daily - 7x weekly Standing Hip Flexion - 10 reps - 1 sets - 2x daily - 7x weekly Seated Hamstring Stretch - 3 reps - 20 seconds hold - 2x daily - 7x weekly  Patient Education Trigger Point Dry Needling

## 2019-10-24 NOTE — Therapy (Signed)
[facility] Physical Therapy [address] [place], [place], [address] Phone: [phone]   Fax:  [phone]  Physical Therapy Treatment  Patient Details  Name: Vanessa Medina MRN: [id number] Date of Birth: 1970/10/02 Referring Provider (PT): Jessy Oto, MD   Encounter Date: 10/24/2019  PT End of Session - 10/24/19 1550    Visit Number  2    Number of Visits  12    Date for PT Re-Evaluation  12/03/19    Authorization Type  [employer]    PT Start Time  3419    PT Stop Time  1615    PT Time Calculation (min)  45 min    Equipment Utilized During Treatment  Back brace    Activity Tolerance  Patient tolerated treatment well    Behavior During Therapy  [facility] for tasks assessed/performed       Past Medical History:  Diagnosis Date  . Anemia   . Cervical cancer ([place])   . Family history of adverse reaction to anesthesia     " my mother takes a long time time wake up"  . GERD (gastroesophageal reflux disease)   . Headache    migraine  . History of shingles   . Hypertension   . Migraine   . Multiple allergies   . Obesity   . Osteoarthritis    left hip  . PONV (postoperative nausea and vomiting)   . Sleep apnea    does not wear CPAP  . Wears glasses     Past Surgical History:  Procedure Laterality Date  . ABDOMINAL HYSTERECTOMY    . APPENDECTOMY    . BILIOPANCREATIC DIVERSION     with duodenal switch laparoscopic   . CHOLECYSTECTOMY    . DIAGNOSTIC LAPAROSCOPY     LOA  . ESOPHAGOGASTRODUODENOSCOPY (EGD) WITH PROPOFOL N/A 07/25/2017   Procedure: ESOPHAGOGASTRODUODENOSCOPY (EGD) WITH PROPOFOL;  Surgeon: Manya Silvas, MD;  Location[facility] END[facility];  Service: Endoscopy;  Laterality: N/A;  . KNEE ARTHROSCOPY    . LAPAROSCOPIC GASTRIC SLEEVE RESECTION WITH HIATAL HERNIA REPAIR    . TONSILLECTOMY    . TOTAL HIP ARTHROPLASTY Left 01/26/2016   Procedure: LEFT TOTAL HIP ARTHROPLASTY ANTERIOR APPROACH;  Surgeon: Leandrew Koyanagi, MD;  Location: [place];   Service: Orthopedics;  Laterality: Left;    There were no vitals filed for this visit.  Subjective Assessment - 10/24/19 1528    Subjective  Patient reports she is doing good today. She is consistent with exercises at home.    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  Yes    Pain Score  3     Pain Location  Back    Pain Orientation  Lower;Left    Pain Descriptors / Indicators  Aching    Pain Type  Surgical pain    Pain Radiating Towards  LLE to knee    Pain Onset  More than a month ago    Pain Frequency  Constant         [facility] PT Assessment - 10/24/19 0001      Assessment   Medical Diagnosis  Z98.1 (ICD-10-CM) - S/P lumbar spinal fusion    Referring Provider (PT)  Jessy Oto, MD    Onset Date/Surgical Date  09/02/19      Precautions   Precautions  Back    Precaution Comments  no lifting > 10#, no core  strengthening for at least 6 more weeks    Required Braces or Orthoses  Spinal Brace    Spinal Brace  Lumbar corset;Other (comment)   when up/awake     Restrictions   Weight Bearing Restrictions  No                   [facility] Adult PT Treatment/Exercise - 10/24/19 0001      Exercises   Exercises  Lumbar      Lumbar Exercises: Stretches   Passive Hamstring Stretch  3 reps;20 seconds    Passive Hamstring Stretch Limitations  seated edge of mat      Lumbar Exercises: Aerobic   Nustep  L3 x 5 min (LE only)      Lumbar Exercises: Standing   Heel Raises  10 reps   2 sets   Heel Raises Limitations  1# ankle weights    Other Standing Lumbar Exercises  Hip abduction, extension, marching with 1# 2x10 each    Other Standing Lumbar Exercises  Hamstring curl with 1# 2x10      Lumbar Exercises: Seated   Long Arc Quad on Chair  2 sets;10 reps    LAQ on Chair Weights (lbs)  1             PT Education - 10/24/19 1549    Education Details  HEP, proper log roll technique to  improve bed mobility    Person(s) Educated  Patient    Methods  Explanation;Demonstration;Verbal cues    Comprehension  Verbalized understanding;Returned demonstration;Verbal cues required;Need further instruction          PT Long Term Goals - 10/22/19 2024      PT LONG TERM GOAL #1   Title  independent with HEP    Status  New    Target Date  12/03/19      PT LONG TERM GOAL #2   Title  amb without AD independently with pain < 4/10 for improved function    Status  New    Target Date  12/03/19      PT LONG TERM GOAL #3   Title  improve gait velocity to > 1.8 ft/sec for improved mobility and decreased fall risk    Status  New    Target Date  12/03/19      PT LONG TERM GOAL #4   Title  improve 5x STS to < 45 sec for improved function    Status  New    Target Date  12/03/19      PT LONG TERM GOAL #5   Title  report standing and walking tolerance to at least 20 min without increase in pain for improved function    Status  New    Target Date  12/03/19            Plan - 10/24/19 1552    Clinical Impression Statement  Patient tolerated therapy well with no adverse effects. She did well with the progression in strengthening and addition of hamstring stretching this visit. She required cueing for posture and proper technique. She continues to have the LLE discomfort especially with walking. She would benefit from continued skilled PT to progress strength and improve pain level.    PT Treatment/Interventions  ADLs/Self Care Home Management;Cryotherapy;Electrical Stimulation;Moist Heat;Traction;Balance training;Therapeutic exercise;Therapeutic activities;Functional mobility training;Stair training;Gait training;Neuromuscular re-education;Patient/family education;Manual techniques;Taping;Dry needling;Passive range of motion    PT Next Visit Plan  review HEP, progress strengthening; hamstring/ITB/piriformis stretches; functional mobility and endurance activities  PT Home Exercise  Plan  Access Code: [email or web address]    Consulted and Agree with Plan of Care  Patient       Patient will benefit from skilled therapeutic intervention in order to improve the following deficits and impairments:  Abnormal gait, Decreased strength, Increased fascial restricitons, Increased muscle spasms, Pain, Difficulty walking, Decreased mobility, Decreased balance, Impaired flexibility, Postural dysfunction  Visit Diagnosis: Acute left-sided low back pain with left-sided sciatica  Muscle weakness (generalized)  Other abnormalities of gait and mobility  Difficulty in walking, not elsewhere classified     Problem List Patient Active Problem List   Diagnosis Date Noted  . DDD (degenerative disc disease), lumbar 09/02/2019    Class: Chronic  . Degenerative disc disease, lumbar 09/02/2019  . Chest tightness 09/23/2018  . Bradycardia 09/23/2018  . Labile blood pressure 03/08/2018  . Chronic low back pain 09/03/2017  . History of total hip replacement, left 01/26/2016  . EDEMA 04/28/2008  . LEG PAIN, BILATERAL 12/16/2007  . CERVICAL CANCER 07/02/2007  . MORBID OBESITY 07/02/2007  . DEPRESSION 07/02/2007  . COMMON MIGRAINE 07/02/2007  . ALLERGIC RHINITIS 07/02/2007  . ASTHMA 07/02/2007  . GERD 07/02/2007  . ELEVATED BLOOD PRESSURE WITHOUT DIAGNOSIS OF HYPERTENSION 07/02/2007    Hilda Blades, PT, DPT, LAT, ATC 10/24/19  4:21 PM Phone: [phone] Fax: [facility] [address] [place], [place], [address] Phone: [phone]   Fax:  [phone]  Name: REANA CHACKO MRN: [id number] Date of Birth: [birth year]

## 2019-10-28 ENCOUNTER — Telehealth: Payer: Self-pay | Admitting: Specialist

## 2019-10-28 NOTE — Telephone Encounter (Signed)
Last 2 ov notes faxed to [facility] [phone]/HAZC

## 2019-11-01 ENCOUNTER — Other Ambulatory Visit: Payer: Self-pay | Admitting: Specialist

## 2019-11-04 ENCOUNTER — Ambulatory Visit (INDEPENDENT_AMBULATORY_CARE_PROVIDER_SITE_OTHER): Payer: 59 | Admitting: Physical Therapy

## 2019-11-04 ENCOUNTER — Other Ambulatory Visit: Payer: Self-pay

## 2019-11-04 ENCOUNTER — Encounter: Payer: Self-pay | Admitting: Physical Therapy

## 2019-11-04 DIAGNOSIS — R2689 Other abnormalities of gait and mobility: Secondary | ICD-10-CM | POA: Diagnosis not present

## 2019-11-04 DIAGNOSIS — M6281 Muscle weakness (generalized): Secondary | ICD-10-CM | POA: Diagnosis not present

## 2019-11-04 DIAGNOSIS — M5442 Lumbago with sciatica, left side: Secondary | ICD-10-CM | POA: Diagnosis not present

## 2019-11-04 DIAGNOSIS — R262 Difficulty in walking, not elsewhere classified: Secondary | ICD-10-CM | POA: Diagnosis not present

## 2019-11-04 NOTE — Therapy (Signed)
[facility] [address] [place], [place], [address] Phone: [phone]   Fax:  [phone]  Physical Therapy Treatment  Patient Details  Name: Vanessa Medina MRN: [id number] Date of Birth: [birth year] Referring Provider (PT): Jessy Oto, MD   Encounter Date: 11/04/2019  PT End of Session - 11/04/19 1029    Visit Number  3    Number of Visits  12    Date for PT Re-Evaluation  12/03/19    Authorization Type  UHC    PT Start Time  1601    PT Stop Time  1100    PT Time Calculation (min)  45 min    Equipment Utilized During Treatment  Back brace    Activity Tolerance  Patient tolerated treatment well    Behavior During Therapy  [facility] for tasks assessed/performed       Past Medical History:  Diagnosis Date  . Anemia   . Cervical cancer ([place])   . Family history of adverse reaction to anesthesia     " my mother takes a long time time wake up"  . GERD (gastroesophageal reflux disease)   . Headache    migraine  . History of shingles   . Hypertension   . Migraine   . Multiple allergies   . Obesity   . Osteoarthritis    left hip  . PONV (postoperative nausea and vomiting)   . Sleep apnea    does not wear CPAP  . Wears glasses     Past Surgical History:  Procedure Laterality Date  . ABDOMINAL HYSTERECTOMY    . APPENDECTOMY    . BILIOPANCREATIC DIVERSION     with duodenal switch laparoscopic   . CHOLECYSTECTOMY    . DIAGNOSTIC LAPAROSCOPY     LOA  . ESOPHAGOGASTRODUODENOSCOPY (EGD) WITH PROPOFOL N/A 07/25/2017   Procedure: ESOPHAGOGASTRODUODENOSCOPY (EGD) WITH PROPOFOL;  Surgeon: Manya Silvas, MD;  Location: [facility] ENDOSCOPY;  Service: Endoscopy;  Laterality: N/A;  . KNEE ARTHROSCOPY    . LAPAROSCOPIC GASTRIC SLEEVE RESECTION WITH HIATAL HERNIA REPAIR    . TONSILLECTOMY    . TOTAL HIP ARTHROPLASTY Left 01/26/2016   Procedure: LEFT TOTAL HIP ARTHROPLASTY ANTERIOR APPROACH;  Surgeon: Leandrew Koyanagi, MD;  Location: [facility];   Service: Orthopedics;  Laterality: Left;    There were no vitals filed for this visit.  Subjective Assessment - 11/04/19 1024    Subjective  Pt reports doing a lot of walking yesterday almost 2 miles. Pt reporting pain of 8/10 after her walk. Pt currently reporting pain of 3/10. Lumbar corset intact.    Pertinent History  hx cervical ca, obesity, Lt THA, HTN    Limitations  Standing;Walking;Lifting    How long can you stand comfortably?  1-2 min    How long can you walk comfortably?  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  Yes    Pain Score  3     Pain Location  Back    Pain Orientation  Lower;Left    Pain Type  Surgical pain    Pain Onset  More than a month ago    Pain Frequency  Constant                       [facility] Adult PT Treatment/Exercise - 11/04/19 0001      Exercises  Exercises  Lumbar      Lumbar Exercises: Stretches   Passive Hamstring Stretch  3 reps;20 seconds    Passive Hamstring Stretch Limitations  seated edge of mat      Lumbar Exercises: Standing   Heel Raises  10 reps   2 sets   Other Standing Lumbar Exercises  Hip abduction, extension, marching with 1# 2x10 each    Other Standing Lumbar Exercises  Hamstring curl with 1# 2x10      Lumbar Exercises: Seated   Long Arc Quad on Chair  2 sets;10 reps    Sit to Stand  10 reps      Lumbar Exercises: Supine   Pelvic Tilt  10 reps;5 seconds    Clam  15 reps;2 seconds;Limitations    Heel Slides  5 reps    Dead Bug  10 reps;2 seconds             PT Education - 11/04/19 1027    Education Details  proper exercise techniques    Person(s) Educated  Patient    Methods  Explanation    Comprehension  Verbalized understanding          PT Long Term Goals - 11/04/19 1030      PT LONG TERM GOAL #1   Title  independent with HEP    Status  On-going      PT LONG TERM GOAL #2   Title  amb  without AD independently with pain < 4/10 for improved function    Status  On-going      PT LONG TERM GOAL #3   Title  improve gait velocity to > 1.8 ft/sec for improved mobility and decreased fall risk    Status  On-going      PT LONG TERM GOAL #4   Title  improve 5x STS to < 45 sec for improved function    Status  On-going      PT LONG TERM GOAL #5   Title  report standing and walking tolerance to at least 20 min without increase in pain for improved function    Status  On-going            Plan - 11/04/19 1030    Clinical Impression Statement  Pt tolerating exercises well with no complaints about increased pain. Pt reporting during hip abduction exercises her L LE with increased fatigue. Pt progressing with standing exercises and posture and core control. Continue skilled PT.    Personal Factors and Comorbidities  Comorbidity 3+    Comorbidities  hx cervical ca, obesity, Lt THA, HTN    Examination-Activity Limitations  Sit;Stand;Carry;Lift;Locomotion Level;Transfers    Examination-Participation Restrictions  Other    Stability/Clinical Decision Making  Evolving/Moderate complexity    Rehab Potential  Good    PT Frequency  2x / week    PT Duration  6 weeks    PT Treatment/Interventions  ADLs/Self Care Home Management;Cryotherapy;Electrical Stimulation;Moist Heat;Traction;Balance training;Therapeutic exercise;Therapeutic activities;Functional mobility training;Stair training;Gait training;Neuromuscular re-education;Patient/family education;Manual techniques;Taping;Dry needling;Passive range of motion    PT Next Visit Plan  review HEP, progress strengthening; hamstring/ITB/piriformis stretches; functional mobility and endurance activities    PT Home Exercise Plan  Access Code: [email or web address]    Consulted and Agree with Plan of Care  Patient       Patient will benefit from skilled therapeutic intervention in order to improve the following deficits and impairments:  Abnormal gait,  Decreased strength, Increased fascial restricitons, Increased muscle spasms, Pain, Difficulty walking, Decreased mobility,  Decreased balance, Impaired flexibility, Postural dysfunction  Visit Diagnosis: Acute left-sided low back pain with left-sided sciatica  Muscle weakness (generalized)  Other abnormalities of gait and mobility  Difficulty in walking, not elsewhere classified     Problem List Patient Active Problem List   Diagnosis Date Noted  . DDD (degenerative disc disease), lumbar 09/02/2019    Class: Chronic  . Degenerative disc disease, lumbar 09/02/2019  . Chest tightness 09/23/2018  . Bradycardia 09/23/2018  . Labile blood pressure 03/08/2018  . Chronic low back pain 09/03/2017  . History of total hip replacement, left 01/26/2016  . EDEMA 04/28/2008  . LEG PAIN, BILATERAL 12/16/2007  . CERVICAL CANCER 07/02/2007  . MORBID OBESITY 07/02/2007  . DEPRESSION 07/02/2007  . COMMON MIGRAINE 07/02/2007  . ALLERGIC RHINITIS 07/02/2007  . ASTHMA 07/02/2007  . GERD 07/02/2007  . ELEVATED BLOOD PRESSURE WITHOUT DIAGNOSIS OF HYPERTENSION 07/02/2007    Oretha Caprice, PT 11/04/2019, 10:55 AM  [facility] Physical Therapy [address] [place], [place], [address] Phone: [phone]   Fax:  [phone]  Name: Vanessa Medina MRN: [id number] Date of Birth: 03-26-1971

## 2019-11-06 ENCOUNTER — Ambulatory Visit: Payer: 59 | Admitting: Physical Therapy

## 2019-11-06 ENCOUNTER — Encounter: Payer: Self-pay | Admitting: Physical Therapy

## 2019-11-06 ENCOUNTER — Other Ambulatory Visit: Payer: Self-pay

## 2019-11-06 DIAGNOSIS — M6281 Muscle weakness (generalized): Secondary | ICD-10-CM

## 2019-11-06 DIAGNOSIS — M5442 Lumbago with sciatica, left side: Secondary | ICD-10-CM | POA: Diagnosis not present

## 2019-11-06 DIAGNOSIS — R2689 Other abnormalities of gait and mobility: Secondary | ICD-10-CM | POA: Diagnosis not present

## 2019-11-06 DIAGNOSIS — R262 Difficulty in walking, not elsewhere classified: Secondary | ICD-10-CM | POA: Diagnosis not present

## 2019-11-06 NOTE — Therapy (Signed)
[facility] Physical Therapy [address] [place], [place], [address] Phone: [phone]   Fax:  [phone]  Physical Therapy Treatment  Patient Details  Name: Vanessa Medina MRN: [id number] Date of Birth: 06/05/1971 Referring Provider (PT): Jessy Oto, MD   Encounter Date: 11/06/2019  PT End of Session - 11/06/19 1355    Visit Number  4    Number of Visits  12    Date for PT Re-Evaluation  12/03/19    Authorization Type  UHC    PT Start Time  1310    PT Stop Time  1353    PT Time Calculation (min)  43 min    Equipment Utilized During Treatment  Back brace    Activity Tolerance  Patient tolerated treatment well    Behavior During Therapy  [facility] for tasks assessed/performed       Past Medical History:  Diagnosis Date  . Anemia   . Cervical cancer ([place])   . Family history of adverse reaction to anesthesia     " my mother takes a long time time wake up"  . GERD (gastroesophageal reflux disease)   . Headache    migraine  . History of shingles   . Hypertension   . Migraine   . Multiple allergies   . Obesity   . Osteoarthritis    left hip  . PONV (postoperative nausea and vomiting)   . Sleep apnea    does not wear CPAP  . Wears glasses     Past Surgical History:  Procedure Laterality Date  . ABDOMINAL HYSTERECTOMY    . APPENDECTOMY    . BILIOPANCREATIC DIVERSION     with duodenal switch laparoscopic   . CHOLECYSTECTOMY    . DIAGNOSTIC LAPAROSCOPY     LOA  . ESOPHAGOGASTRODUODENOSCOPY (EGD) WITH PROPOFOL N/A 07/25/2017   Procedure: ESOPHAGOGASTRODUODENOSCOPY (EGD) WITH PROPOFOL;  Surgeon: Manya Silvas, MD;  Location: [facility] ENDOSCOPY;  Service: Endoscopy;  Laterality: N/A;  . KNEE ARTHROSCOPY    . LAPAROSCOPIC GASTRIC SLEEVE RESECTION WITH HIATAL HERNIA REPAIR    . TONSILLECTOMY    . TOTAL HIP ARTHROPLASTY Left 01/26/2016   Procedure: LEFT TOTAL HIP ARTHROPLASTY ANTERIOR APPROACH;  Surgeon: Leandrew Koyanagi, MD;  Location: [place];   Service: Orthopedics;  Laterality: Left;    There were no vitals filed for this visit.  Subjective Assessment - 11/06/19 1311    Subjective  sore after Tues session, seems to be easing off now.  reports pain yesterday was a "10" - more muscle soreness; today is a "5"    Pertinent History  hx cervical ca, obesity, Lt THA, HTN    Limitations  Standing;Walking;Lifting    How long can you stand comfortably?  1-2 min    How long can you walk comfortably?  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  Yes    Pain Score  5     Pain Location  Back    Pain Orientation  Lower;Left    Pain Descriptors / Indicators  Sore    Pain Type  Surgical pain    Pain Radiating Towards  LLE to knee    Pain Onset  More than a month ago    Pain Frequency  Constant    Aggravating Factors   standing, walking, repositioning, sitting    Pain Relieving Factors  meds, heat  [facility] Adult PT Treatment/Exercise - 11/06/19 1313      Lumbar Exercises: Stretches   Passive Hamstring Stretch  3 reps;20 seconds    Passive Hamstring Stretch Limitations  seated edge of mat    Piriformis Stretch  Right;Left;3 reps;30 seconds    Piriformis Stretch Limitations  seated      Lumbar Exercises: Aerobic   Nustep  L5 x 5 min (LE only)      Lumbar Exercises: Standing   Heel Raises  20 reps    Other Standing Lumbar Exercises  sidestepping in // bars with L4 band x 4 laps; forward/lateral step ups x 10 bil on 6" step      Lumbar Exercises: Seated   Sit to Stand  10 reps   without UE support     Lumbar Exercises: Supine   Pelvic Tilt  10 reps;5 seconds    Clam  10 reps   with ab set                 PT Long Term Goals - 11/04/19 1030      PT LONG TERM GOAL #1   Title  independent with HEP    Status  On-going      PT LONG TERM GOAL #2   Title  amb without AD independently with pain <  4/10 for improved function    Status  On-going      PT LONG TERM GOAL #3   Title  improve gait velocity to > 1.8 ft/sec for improved mobility and decreased fall risk    Status  On-going      PT LONG TERM GOAL #4   Title  improve 5x STS to < 45 sec for improved function    Status  On-going      PT LONG TERM GOAL #5   Title  report standing and walking tolerance to at least 20 min without increase in pain for improved function    Status  On-going            Plan - 11/06/19 1355    Clinical Impression Statement  Pt continues to progress well with PT tolerating increased standing and strengthening activities with each session.  Fatigues quickly with standing exercises.  Will continue to benefit from PT to maximize function.    Personal Factors and Comorbidities  Comorbidity 3+    Comorbidities  hx cervical ca, obesity, Lt THA, HTN    Examination-Activity Limitations  Sit;Stand;Carry;Lift;Locomotion Level;Transfers    Examination-Participation Restrictions  Other    Stability/Clinical Decision Making  Evolving/Moderate complexity    Rehab Potential  Good    PT Frequency  2x / week    PT Duration  6 weeks    PT Treatment/Interventions  ADLs/Self Care Home Management;Cryotherapy;Electrical Stimulation;Moist Heat;Traction;Balance training;Therapeutic exercise;Therapeutic activities;Functional mobility training;Stair training;Gait training;Neuromuscular re-education;Patient/family education;Manual techniques;Taping;Dry needling;Passive range of motion    PT Next Visit Plan  progress strengthening; hamstring/ITB/piriformis stretches; functional mobility and endurance activities    PT Home Exercise Plan  Access Code: [email or web address]    Consulted and Agree with Plan of Care  Patient       Patient will benefit from skilled therapeutic intervention in order to improve the following deficits and impairments:  Abnormal gait, Decreased strength, Increased fascial restricitons, Increased muscle spasms,  Pain, Difficulty walking, Decreased mobility, Decreased balance, Impaired flexibility, Postural dysfunction  Visit Diagnosis: Acute left-sided low back pain with left-sided sciatica  Muscle weakness (generalized)  Other abnormalities of gait and mobility  Difficulty in  walking, not elsewhere classified     Problem List Patient Active Problem List   Diagnosis Date Noted  . DDD (degenerative disc disease), lumbar 09/02/2019    Class: Chronic  . Degenerative disc disease, lumbar 09/02/2019  . Chest tightness 09/23/2018  . Bradycardia 09/23/2018  . Labile blood pressure 03/08/2018  . Chronic low back pain 09/03/2017  . History of total hip replacement, left 01/26/2016  . EDEMA 04/28/2008  . LEG PAIN, BILATERAL 12/16/2007  . CERVICAL CANCER 07/02/2007  . MORBID OBESITY 07/02/2007  . DEPRESSION 07/02/2007  . COMMON MIGRAINE 07/02/2007  . ALLERGIC RHINITIS 07/02/2007  . ASTHMA 07/02/2007  . GERD 07/02/2007  . ELEVATED BLOOD PRESSURE WITHOUT DIAGNOSIS OF HYPERTENSION 07/02/2007      Laureen Abrahams, PT, DPT 11/06/19 1:57 PM     [facility] [address] [place], [place], [address] Phone: [phone]   Fax:  [phone]  Name: Vanessa Medina MRN: [id number] Date of Birth: 08/12/1971

## 2019-11-11 ENCOUNTER — Ambulatory Visit: Payer: 59 | Admitting: Physical Therapy

## 2019-11-11 ENCOUNTER — Other Ambulatory Visit: Payer: Self-pay

## 2019-11-11 ENCOUNTER — Encounter: Payer: Self-pay | Admitting: Physical Therapy

## 2019-11-11 DIAGNOSIS — R262 Difficulty in walking, not elsewhere classified: Secondary | ICD-10-CM | POA: Diagnosis not present

## 2019-11-11 DIAGNOSIS — M5442 Lumbago with sciatica, left side: Secondary | ICD-10-CM | POA: Diagnosis not present

## 2019-11-11 DIAGNOSIS — M6281 Muscle weakness (generalized): Secondary | ICD-10-CM | POA: Diagnosis not present

## 2019-11-11 DIAGNOSIS — R2689 Other abnormalities of gait and mobility: Secondary | ICD-10-CM | POA: Diagnosis not present

## 2019-11-11 NOTE — Therapy (Signed)
[facility] Physical Therapy [address] [place], [place], [address] Phone: [phone]   Fax:  [phone]  Physical Therapy Treatment  Patient Details  Name: Vanessa Medina MRN: [id number] Date of Birth: 20-Oct-1970 Referring Provider (PT): Jessy Oto, MD   Encounter Date: 11/11/2019  PT End of Session - 11/11/19 1257    Visit Number  5    Number of Visits  12    Date for PT Re-Evaluation  12/03/19    Authorization Type  UHC    PT Start Time  8875    PT Stop Time  1223    PT Time Calculation (min)  39 min    Equipment Utilized During Treatment  Back brace    Activity Tolerance  Patient tolerated treatment well    Behavior During Therapy  [facility] for tasks assessed/performed       Past Medical History:  Diagnosis Date  . Anemia   . Cervical cancer ([place])   . Family history of adverse reaction to anesthesia     " my mother takes a long time time wake up"  . GERD (gastroesophageal reflux disease)   . Headache    migraine  . History of shingles   . Hypertension   . Migraine   . Multiple allergies   . Obesity   . Osteoarthritis    left hip  . PONV (postoperative nausea and vomiting)   . Sleep apnea    does not wear CPAP  . Wears glasses     Past Surgical History:  Procedure Laterality Date  . ABDOMINAL HYSTERECTOMY    . APPENDECTOMY    . BILIOPANCREATIC DIVERSION     with duodenal switch laparoscopic   . CHOLECYSTECTOMY    . DIAGNOSTIC LAPAROSCOPY     LOA  . ESOPHAGOGASTRODUODENOSCOPY (EGD) WITH PROPOFOL N/A 07/25/2017   Procedure: ESOPHAGOGASTRODUODENOSCOPY (EGD) WITH PROPOFOL;  Surgeon: Manya Silvas, MD;  Location: [facility] ENDOSCOPY;  Service: Endoscopy;  Laterality: N/A;  . KNEE ARTHROSCOPY    . LAPAROSCOPIC GASTRIC SLEEVE RESECTION WITH HIATAL HERNIA REPAIR    . TONSILLECTOMY    . TOTAL HIP ARTHROPLASTY Left 01/26/2016   Procedure: LEFT TOTAL HIP ARTHROPLASTY ANTERIOR APPROACH;  Surgeon: Leandrew Koyanagi, MD;  Location: [place];   Service: Orthopedics;  Laterality: Left;    There were no vitals filed for this visit.  Subjective Assessment - 11/11/19 1150    Subjective  doing well; feels a little discomfort on Lt glute/hip; otherwise back "is doing great"    Pertinent History  hx cervical ca, obesity, Lt THA, HTN    Limitations  Standing;Walking;Lifting    How long can you stand comfortably?  1-2 min    How long can you walk comfortably?  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  Yes    Pain Onset  More than a month ago                       [facility] Adult PT Treatment/Exercise - 11/11/19 1153      Lumbar Exercises: Aerobic   Nustep  L7 x 8 min      Lumbar Exercises: Machines for Strengthening   Leg Press  100# 3x10      Lumbar Exercises: Standing   Functional Squats  20 reps;5 seconds   TRX   Other Standing Lumbar Exercises  farmers carry 10# each UE      Lumbar Exercises: Seated   Long Arc Quad on Chair  2 sets;10 reps    LAQ on Chair Weights (lbs)  3    Sit to Stand  20 reps   without UE support   Other Seated Lumbar Exercises  hip abduction with blue theraband 2x10                  PT Long Term Goals - 11/04/19 1030      PT LONG TERM GOAL #1   Title  independent with HEP    Status  On-going      PT LONG TERM GOAL #2   Title  amb without AD independently with pain < 4/10 for improved function    Status  On-going      PT LONG TERM GOAL #3   Title  improve gait velocity to > 1.8 ft/sec for improved mobility and decreased fall risk    Status  On-going      PT LONG TERM GOAL #4   Title  improve 5x STS to < 45 sec for improved function    Status  On-going      PT LONG TERM GOAL #5   Title  report standing and walking tolerance to at least 20 min without increase in pain for improved function    Status  On-going            Plan - 11/11/19 1257    Clinical Impression  Statement  Pt tolerated session well today progressing strengthening and endurance activities.  Will continue to benefit from PT to maximize function.    Personal Factors and Comorbidities  Comorbidity 3+    Comorbidities  hx cervical ca, obesity, Lt THA, HTN    Examination-Activity Limitations  Sit;Stand;Carry;Lift;Locomotion Level;Transfers    Examination-Participation Restrictions  Other    Stability/Clinical Decision Making  Evolving/Moderate complexity    Rehab Potential  Good    PT Frequency  2x / week    PT Duration  6 weeks    PT Treatment/Interventions  ADLs/Self Care Home Management;Cryotherapy;Electrical Stimulation;Moist Heat;Traction;Balance training;Therapeutic exercise;Therapeutic activities;Functional mobility training;Stair training;Gait training;Neuromuscular re-education;Patient/family education;Manual techniques;Taping;Dry needling;Passive range of motion    PT Next Visit Plan  progress strengthening; hamstring/ITB/piriformis stretches; functional mobility and endurance activities    PT Home Exercise Plan  Access Code: [email or web address]    Consulted and Agree with Plan of Care  Patient       Patient will benefit from skilled therapeutic intervention in order to improve the following deficits and impairments:  Abnormal gait, Decreased strength, Increased fascial restricitons, Increased muscle spasms, Pain, Difficulty walking, Decreased mobility, Decreased balance, Impaired flexibility, Postural dysfunction  Visit Diagnosis: Acute left-sided low back pain with left-sided sciatica  Muscle weakness (generalized)  Other abnormalities of gait and mobility  Difficulty in walking, not elsewhere classified     Problem List Patient Active Problem List   Diagnosis Date Noted  . DDD (degenerative disc disease), lumbar 09/02/2019    Class: Chronic  . Degenerative disc disease, lumbar 09/02/2019  . Chest tightness 09/23/2018  . Bradycardia 09/23/2018  . Labile blood pressure  03/08/2018  . Chronic low back pain 09/03/2017  . History of total hip replacement, left 01/26/2016  . EDEMA 04/28/2008  . LEG PAIN, BILATERAL 12/16/2007  . CERVICAL CANCER 07/02/2007  . MORBID OBESITY 07/02/2007  . DEPRESSION 07/02/2007  . COMMON MIGRAINE 07/02/2007  . ALLERGIC RHINITIS 07/02/2007  . ASTHMA 07/02/2007  . GERD  07/02/2007  . ELEVATED BLOOD PRESSURE WITHOUT DIAGNOSIS OF HYPERTENSION 07/02/2007      Laureen Abrahams, PT, DPT 11/11/19 12:59 PM     [facility] [address] [place], [place], [address] Phone: [phone]   Fax:  [phone]  Name: Vanessa Medina MRN: [id number] Date of Birth: 1971/04/09

## 2019-11-13 ENCOUNTER — Ambulatory Visit (INDEPENDENT_AMBULATORY_CARE_PROVIDER_SITE_OTHER): Payer: 59 | Admitting: Physical Therapy

## 2019-11-13 ENCOUNTER — Encounter: Payer: Self-pay | Admitting: Physical Therapy

## 2019-11-13 ENCOUNTER — Other Ambulatory Visit: Payer: Self-pay

## 2019-11-13 DIAGNOSIS — R262 Difficulty in walking, not elsewhere classified: Secondary | ICD-10-CM | POA: Diagnosis not present

## 2019-11-13 DIAGNOSIS — R2689 Other abnormalities of gait and mobility: Secondary | ICD-10-CM

## 2019-11-13 DIAGNOSIS — M5442 Lumbago with sciatica, left side: Secondary | ICD-10-CM | POA: Diagnosis not present

## 2019-11-13 DIAGNOSIS — M6281 Muscle weakness (generalized): Secondary | ICD-10-CM

## 2019-11-13 NOTE — Therapy (Signed)
[facility] [address] [place], [place], [address] Phone: [phone]   Fax:  [phone]  Physical Therapy Treatment  Patient Details  Name: Vanessa Medina MRN: [id number] Date of Birth: October 19, 1970 Referring Provider (PT): Jessy Oto, MD   Encounter Date: 11/13/2019  PT End of Session - 11/13/19 1254    Visit Number  6    Number of Visits  12    Date for PT Re-Evaluation  12/03/19    Authorization Type  UHC    PT Start Time  1140    PT Stop Time  1225    PT Time Calculation (min)  45 min    Equipment Utilized During Treatment  Back brace    Activity Tolerance  Patient tolerated treatment well    Behavior During Therapy  [facility] for tasks assessed/performed       Past Medical History:  Diagnosis Date  . Anemia   . Cervical cancer ([place])   . Family history of adverse reaction to anesthesia     " my mother takes a long time time wake up"  . GERD (gastroesophageal reflux disease)   . Headache    migraine  . History of shingles   . Hypertension   . Migraine   . Multiple allergies   . Obesity   . Osteoarthritis    left hip  . PONV (postoperative nausea and vomiting)   . Sleep apnea    does not wear CPAP  . Wears glasses     Past Surgical History:  Procedure Laterality Date  . ABDOMINAL HYSTERECTOMY    . APPENDECTOMY    . BILIOPANCREATIC DIVERSION     with duodenal switch laparoscopic   . CHOLECYSTECTOMY    . DIAGNOSTIC LAPAROSCOPY     LOA  . ESOPHAGOGASTRODUODENOSCOPY (EGD) WITH PROPOFOL N/A 07/25/2017   Procedure: ESOPHAGOGASTRODUODENOSCOPY (EGD) WITH PROPOFOL;  Surgeon: Manya Silvas, MD;  Location: [facility] ENDOSCOPY;  Service: Endoscopy;  Laterality: N/A;  . KNEE ARTHROSCOPY    . LAPAROSCOPIC GASTRIC SLEEVE RESECTION WITH HIATAL HERNIA REPAIR    . TONSILLECTOMY    . TOTAL HIP ARTHROPLASTY Left 01/26/2016   Procedure: LEFT TOTAL HIP ARTHROPLASTY ANTERIOR APPROACH;  Surgeon: Leandrew Koyanagi, MD;  Location: [place];   Service: Orthopedics;  Laterality: Left;    There were no vitals filed for this visit.  Subjective Assessment - 11/13/19 1142    Subjective  did a lot of walking yesterday and is pretty sore today.    Pertinent History  hx cervical ca, obesity, Lt THA, HTN    Limitations  Standing;Walking;Lifting    How long can you stand comfortably?  1-2 min    How long can you walk comfortably?  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  Yes    Pain Score  5     Pain Location  Back    Pain Orientation  Lower;Left    Pain Descriptors / Indicators  Sore    Pain Onset  More than a month ago                       [facility] Adult PT Treatment/Exercise - 11/13/19 1145      Lumbar Exercises: Stretches   Passive Hamstring Stretch  3 reps;30 seconds    Passive Hamstring Stretch Limitations  seated edge of mat  Piriformis Stretch  Right;Left;3 reps;30 seconds    Piriformis Stretch Limitations  seated    Gastroc Stretch  3 reps;30 seconds   slant board     Lumbar Exercises: Aerobic   Nustep  L7 x 8 min      Lumbar Exercises: Standing   Row  Both;20 reps;Theraband    Theraband Level (Row)  Level 4 (Blue)    Other Standing Lumbar Exercises  forward/lateral step ups x 10 bil; 4" step without UE support      Lumbar Exercises: Seated   Sit to Stand  20 reps   without UE support; 10# KB                 PT Long Term Goals - 11/04/19 1030      PT LONG TERM GOAL #1   Title  independent with HEP    Status  On-going      PT LONG TERM GOAL #2   Title  amb without AD independently with pain < 4/10 for improved function    Status  On-going      PT LONG TERM GOAL #3   Title  improve gait velocity to > 1.8 ft/sec for improved mobility and decreased fall risk    Status  On-going      PT LONG TERM GOAL #4   Title  improve 5x STS to < 45 sec for improved function    Status  On-going       PT LONG TERM GOAL #5   Title  report standing and walking tolerance to at least 20 min without increase in pain for improved function    Status  On-going            Plan - 11/13/19 1254    Clinical Impression Statement  Tolerated progression of exercises well today with continued strengthening exercises.  Will continue to benefit from PT to maximize function.    Personal Factors and Comorbidities  Comorbidity 3+    Comorbidities  hx cervical ca, obesity, Lt THA, HTN    Examination-Activity Limitations  Sit;Stand;Carry;Lift;Locomotion Level;Transfers    Examination-Participation Restrictions  Other    Stability/Clinical Decision Making  Evolving/Moderate complexity    Rehab Potential  Good    PT Frequency  2x / week    PT Duration  6 weeks    PT Treatment/Interventions  ADLs/Self Care Home Management;Cryotherapy;Electrical Stimulation;Moist Heat;Traction;Balance training;Therapeutic exercise;Therapeutic activities;Functional mobility training;Stair training;Gait training;Neuromuscular re-education;Patient/family education;Manual techniques;Taping;Dry needling;Passive range of motion    PT Next Visit Plan  progress strengthening; hamstring/ITB/piriformis stretches; functional mobility and endurance activities    PT Home Exercise Plan  Access Code: [email or web address]    Consulted and Agree with Plan of Care  Patient       Patient will benefit from skilled therapeutic intervention in order to improve the following deficits and impairments:  Abnormal gait, Decreased strength, Increased fascial restricitons, Increased muscle spasms, Pain, Difficulty walking, Decreased mobility, Decreased balance, Impaired flexibility, Postural dysfunction  Visit Diagnosis: Acute left-sided low back pain with left-sided sciatica  Muscle weakness (generalized)  Other abnormalities of gait and mobility  Difficulty in walking, not elsewhere classified     Problem List Patient Active Problem List    Diagnosis Date Noted  . DDD (degenerative disc disease), lumbar 09/02/2019    Class: Chronic  . Degenerative disc disease, lumbar 09/02/2019  . Chest tightness 09/23/2018  . Bradycardia 09/23/2018  . Labile blood pressure 03/08/2018  . Chronic low back pain 09/03/2017  . History of  total hip replacement, left 01/26/2016  . EDEMA 04/28/2008  . LEG PAIN, BILATERAL 12/16/2007  . CERVICAL CANCER 07/02/2007  . MORBID OBESITY 07/02/2007  . DEPRESSION 07/02/2007  . COMMON MIGRAINE 07/02/2007  . ALLERGIC RHINITIS 07/02/2007  . ASTHMA 07/02/2007  . GERD 07/02/2007  . ELEVATED BLOOD PRESSURE WITHOUT DIAGNOSIS OF HYPERTENSION 07/02/2007      Laureen Abrahams, PT, DPT 11/13/19 12:55 PM     [facility] [address] [place], [place], [address] Phone: [phone]   Fax:  [phone]  Name: Vanessa Medina MRN: [id number] Date of Birth: [birth year]

## 2019-11-18 ENCOUNTER — Ambulatory Visit: Payer: 59 | Admitting: Physical Therapy

## 2019-11-18 ENCOUNTER — Encounter: Payer: Self-pay | Admitting: Physical Therapy

## 2019-11-18 ENCOUNTER — Other Ambulatory Visit: Payer: Self-pay

## 2019-11-18 DIAGNOSIS — R262 Difficulty in walking, not elsewhere classified: Secondary | ICD-10-CM

## 2019-11-18 DIAGNOSIS — R2689 Other abnormalities of gait and mobility: Secondary | ICD-10-CM | POA: Diagnosis not present

## 2019-11-18 DIAGNOSIS — M6281 Muscle weakness (generalized): Secondary | ICD-10-CM

## 2019-11-18 DIAGNOSIS — M5442 Lumbago with sciatica, left side: Secondary | ICD-10-CM

## 2019-11-18 NOTE — Therapy (Signed)
[facility] Therapy [address] [place], [place], [address] Phone: [phone]   Fax:  [phone]  Physical Therapy Treatment  Patient Details  Name: Vanessa Medina MRN: [id number] Date of Birth: 12-22-1970 Referring Provider (PT): Jessy Oto, MD   Encounter Date: 11/18/2019  PT End of Session - 11/18/19 1249    Visit Number  7    Number of Visits  12    Date for PT Re-Evaluation  12/03/19    Authorization Type  UHC    PT Start Time  1138    PT Stop Time  1221    PT Time Calculation (min)  43 min    Equipment Utilized During Treatment  Back brace    Activity Tolerance  Patient tolerated treatment well    Behavior During Therapy  [facility] for tasks assessed/performed       Past Medical History:  Diagnosis Date  . Anemia   . Cervical cancer ([place])   . Family history of adverse reaction to anesthesia     " my mother takes a long time time wake up"  . GERD (gastroesophageal reflux disease)   . Headache    migraine  . History of shingles   . Hypertension   . Migraine   . Multiple allergies   . Obesity   . Osteoarthritis    left hip  . PONV (postoperative nausea and vomiting)   . Sleep apnea    does not wear CPAP  . Wears glasses     Past Surgical History:  Procedure Laterality Date  . ABDOMINAL HYSTERECTOMY    . APPENDECTOMY    . BILIOPANCREATIC DIVERSION     with duodenal switch laparoscopic   . CHOLECYSTECTOMY    . DIAGNOSTIC LAPAROSCOPY     LOA  . ESOPHAGOGASTRODUODENOSCOPY (EGD) WITH PROPOFOL N/A 07/25/2017   Procedure: ESOPHAGOGASTRODUODENOSCOPY (EGD) WITH PROPOFOL;  Surgeon: Manya Silvas, MD;  Location: [facility] ENDOSCOPY;  Service: Endoscopy;  Laterality: N/A;  . KNEE ARTHROSCOPY    . LAPAROSCOPIC GASTRIC SLEEVE RESECTION WITH HIATAL HERNIA REPAIR    . TONSILLECTOMY    . TOTAL HIP ARTHROPLASTY Left 01/26/2016   Procedure: LEFT TOTAL HIP ARTHROPLASTY ANTERIOR APPROACH;  Surgeon: Leandrew Koyanagi, MD;  Location: [place];   Service: Orthopedics;  Laterality: Left;    There were no vitals filed for this visit.  Subjective Assessment - 11/18/19 1142    Subjective  did a lot of shopping/walking over the weekend in flip flops - realized that was very difficult.  bought some new shoes over the weekend and looking for different sandals.    Pertinent History  hx cervical ca, obesity, Lt THA, HTN    Limitations  Standing;Walking;Lifting    How long can you stand comfortably?  1-2 min    How long can you walk comfortably?  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  Yes    Pain Score  6     Pain Location  Back    Pain Orientation  Left;Lower    Pain Descriptors / Indicators  Sore    Pain Type  Surgical pain;Acute pain    Pain Onset  More than a month ago    Pain Frequency  Constant    Aggravating Factors   standing, walking, repositioning, sitting    Pain Relieving Factors  meds, heat  [facility] Adult PT Treatment/Exercise - 11/18/19 1144      Lumbar Exercises: Stretches   Passive Hamstring Stretch  3 reps;30 seconds    Passive Hamstring Stretch Limitations  seated edge of mat    Piriformis Stretch  Right;Left;3 reps;30 seconds    Piriformis Stretch Limitations  seated      Lumbar Exercises: Aerobic   Nustep  L7 x 10 min      Lumbar Exercises: Seated   Sit to Stand  20 reps   no UE support     Lumbar Exercises: Supine   Pelvic Tilt  15 reps;5 seconds    Clam  15 reps   red band with ab set; single limb clam   Bridge  15 reps;3 seconds      Modalities   Modalities  Moist Heat      Moist Heat Therapy   Number Minutes Moist Heat  15 Minutes    Moist Heat Location  Lumbar Spine   with supine exercises                 PT Long Term Goals - 11/04/19 1030      PT LONG TERM GOAL #1   Title  independent with HEP    Status  On-going      PT LONG TERM GOAL #2    Title  amb without AD independently with pain < 4/10 for improved function    Status  On-going      PT LONG TERM GOAL #3   Title  improve gait velocity to > 1.8 ft/sec for improved mobility and decreased fall risk    Status  On-going      PT LONG TERM GOAL #4   Title  improve 5x STS to < 45 sec for improved function    Status  On-going      PT LONG TERM GOAL #5   Title  report standing and walking tolerance to at least 20 min without increase in pain for improved function    Status  On-going            Plan - 11/18/19 1249    Clinical Impression Statement  Continues to do well with PT and tolerates session well.  Still fatigues quickly but overall doing well.  Will continue to benefit from PT to maximize function.    Personal Factors and Comorbidities  Comorbidity 3+    Comorbidities  hx cervical ca, obesity, Lt THA, HTN    Examination-Activity Limitations  Sit;Stand;Carry;Lift;Locomotion Level;Transfers    Examination-Participation Restrictions  Other    Stability/Clinical Decision Making  Evolving/Moderate complexity    Rehab Potential  Good    PT Frequency  2x / week    PT Duration  6 weeks    PT Treatment/Interventions  ADLs/Self Care Home Management;Cryotherapy;Electrical Stimulation;Moist Heat;Traction;Balance training;Therapeutic exercise;Therapeutic activities;Functional mobility training;Stair training;Gait training;Neuromuscular re-education;Patient/family education;Manual techniques;Taping;Dry needling;Passive range of motion    PT Next Visit Plan  progress strengthening; hamstring/ITB/piriformis stretches; functional mobility and endurance activities    PT Home Exercise Plan  Access Code: [email or web address]    Consulted and Agree with Plan of Care  Patient       Patient will benefit from skilled therapeutic intervention in order to improve the following deficits and impairments:  Abnormal gait, Decreased strength, Increased fascial restricitons, Increased muscle spasms, Pain,  Difficulty walking, Decreased mobility, Decreased balance, Impaired flexibility, Postural dysfunction  Visit Diagnosis: Acute left-sided low back pain with left-sided sciatica  Muscle weakness (generalized)  Other abnormalities  of gait and mobility  Difficulty in walking, not elsewhere classified     Problem List Patient Active Problem List   Diagnosis Date Noted  . DDD (degenerative disc disease), lumbar 09/02/2019    Class: Chronic  . Degenerative disc disease, lumbar 09/02/2019  . Chest tightness 09/23/2018  . Bradycardia 09/23/2018  . Labile blood pressure 03/08/2018  . Chronic low back pain 09/03/2017  . History of total hip replacement, left 01/26/2016  . EDEMA 04/28/2008  . LEG PAIN, BILATERAL 12/16/2007  . CERVICAL CANCER 07/02/2007  . MORBID OBESITY 07/02/2007  . DEPRESSION 07/02/2007  . COMMON MIGRAINE 07/02/2007  . ALLERGIC RHINITIS 07/02/2007  . ASTHMA 07/02/2007  . GERD 07/02/2007  . ELEVATED BLOOD PRESSURE WITHOUT DIAGNOSIS OF HYPERTENSION 07/02/2007      Laureen Abrahams, PT, DPT 11/18/19 12:54 PM    [facility] [address] [place], [place], [address] Phone: [phone]   Fax:  [phone]  Name: MARGENE CHERIAN MRN: [id number] Date of Birth: 30-Jul-1971

## 2019-11-20 ENCOUNTER — Other Ambulatory Visit: Payer: Self-pay

## 2019-11-20 ENCOUNTER — Ambulatory Visit: Payer: 59 | Attending: Internal Medicine

## 2019-11-20 ENCOUNTER — Ambulatory Visit (INDEPENDENT_AMBULATORY_CARE_PROVIDER_SITE_OTHER): Payer: 59 | Admitting: Physical Therapy

## 2019-11-20 ENCOUNTER — Encounter: Payer: Self-pay | Admitting: Physical Therapy

## 2019-11-20 DIAGNOSIS — M5442 Lumbago with sciatica, left side: Secondary | ICD-10-CM

## 2019-11-20 DIAGNOSIS — M6281 Muscle weakness (generalized): Secondary | ICD-10-CM | POA: Diagnosis not present

## 2019-11-20 DIAGNOSIS — R2689 Other abnormalities of gait and mobility: Secondary | ICD-10-CM

## 2019-11-20 DIAGNOSIS — Z23 Encounter for immunization: Secondary | ICD-10-CM

## 2019-11-20 DIAGNOSIS — R262 Difficulty in walking, not elsewhere classified: Secondary | ICD-10-CM

## 2019-11-20 NOTE — Therapy (Signed)
[facility] [address] [place], [place], [address] Phone: [phone]   Fax:  [phone]  Physical Therapy Treatment/Recertification  Patient Details  Name: Vanessa Medina MRN: [id number] Date of Birth: April 28, 1971 Referring Provider (PT): Jessy Oto, MD   Encounter Date: 11/20/2019  PT End of Session - 11/20/19 1250    Visit Number  8    Number of Visits  20    Date for PT Re-Evaluation  01/01/20    Authorization Type  UHC    PT Start Time  5366    PT Stop Time  1221    PT Time Calculation (min)  46 min    Equipment Utilized During Treatment  Back brace    Activity Tolerance  Patient tolerated treatment well    Behavior During Therapy  [facility] for tasks assessed/performed       Past Medical History:  Diagnosis Date  . Anemia   . Cervical cancer ([place])   . Family history of adverse reaction to anesthesia     " my mother takes a long time time wake up"  . GERD (gastroesophageal reflux disease)   . Headache    migraine  . History of shingles   . Hypertension   . Migraine   . Multiple allergies   . Obesity   . Osteoarthritis    left hip  . PONV (postoperative nausea and vomiting)   . Sleep apnea    does not wear CPAP  . Wears glasses     Past Surgical History:  Procedure Laterality Date  . ABDOMINAL HYSTERECTOMY    . APPENDECTOMY    . BILIOPANCREATIC DIVERSION     with duodenal switch laparoscopic   . CHOLECYSTECTOMY    . DIAGNOSTIC LAPAROSCOPY     LOA  . ESOPHAGOGASTRODUODENOSCOPY (EGD) WITH PROPOFOL N/A 07/25/2017   Procedure: ESOPHAGOGASTRODUODENOSCOPY (EGD) WITH PROPOFOL;  Surgeon: Manya Silvas, MD;  [facility] ENDOSCOPY;  Service: Endoscopy;  Laterality: N/A;  . KNEE ARTHROSCOPY    . LAPAROSCOPIC GASTRIC SLEEVE RESECTION WITH HIATAL HERNIA REPAIR    . TONSILLECTOMY    . TOTAL HIP ARTHROPLASTY Left 01/26/2016   Procedure: LEFT TOTAL HIP ARTHROPLASTY ANTERIOR APPROACH;  Surgeon: Leandrew Koyanagi, MD;   Location: [place];  Service: Orthopedics;  Laterality: Left;    There were no vitals filed for this visit.  Subjective Assessment - 11/20/19 1138    Subjective  feels better today.  doing well.    Pertinent History  hx cervical ca, obesity, Lt THA, HTN    Limitations  Standing;Walking;Lifting    How long can you stand comfortably?  1-2 min    How long can you walk comfortably?  5 min    Patient Stated Goals  improve pain and mobility; be able to return to work at some point (seated job)    Pain Onset  More than a month ago         [facility] PT Assessment - 11/20/19 1218      Assessment   Medical Diagnosis  Z98.1 (ICD-10-CM) - S/P lumbar spinal fusion    Referring Provider (PT)  Jessy Oto, MD    Onset Date/Surgical Date  09/02/19    Hand Dominance  Right      Ambulation/Gait   Gait velocity  2.18 ft/sec   32.8 ft = 15.06 sec                  [facility] Adult PT Treatment/Exercise - 11/20/19 1140  Lumbar Exercises: Aerobic   Nustep  L7 x 10 min      Lumbar Exercises: Standing   Heel Raises  20 reps    Row  Both;20 reps;Theraband    Theraband Level (Row)  Level 4 (Blue)    Shoulder Extension  Both;20 reps;Theraband    Theraband Level (Shoulder Extension)  Level 4 (Blue)    Other Standing Lumbar Exercises  marching, hip abduction, hip extension x 20 reps bil; 3#                  PT Long Term Goals - 11/20/19 1250      PT LONG TERM GOAL #1   Title  independent with HEP    Status  On-going    Target Date  01/01/20      PT LONG TERM GOAL #2   Title  amb without AD independently with pain < 4/10 for improved function    Baseline  3/18: improved; has some days of no pain, other days up to 5-6/10    Status  On-going    Target Date  01/01/20      PT LONG TERM GOAL #3   Title  improve gait velocity to > 1.8 ft/sec for improved mobility and decreased fall risk    Status  Achieved      PT LONG TERM GOAL #4   Title  improve 5x STS to < 45 sec for  improved function    Status  Achieved      PT LONG TERM GOAL #5   Title  report standing and walking tolerance to at least 20 min without increase in pain for improved function    Baseline  3/18: without UE support 30 min    Status  Achieved      New LTGs:   PT Long Term Goals - 11/20/19 1254      PT LONG TERM GOAL #1   Title  independent with HEP    Status  On-going    Target Date  01/01/20      PT LONG TERM GOAL #2   Title  amb without AD independently with pain < 4/10 for improved function    Baseline  3/18: improved; has some days of no pain, other days up to 5-6/10    Status  On-going    Target Date  01/01/20      PT LONG TERM GOAL #3   Title  improve gait velocity to > 3.5 ft/sec for improved community ambulation and access    Status  Revised    Target Date  01/01/20      PT LONG TERM GOAL #4   Title  improve 5x STS to < 20 sec for improved function    Status  Revised    Target Date  01/01/20      PT LONG TERM GOAL #5   Title  report standing and walking tolerance to at least 45 min without increase in pain for improved function    Baseline  --    Status  Revised    Target Date  01/01/20           Plan - 11/20/19 1252    Clinical Impression Statement  Pt has met 3/5 LTGs to date, so recert with revised goals completed today.  Overall she's progressing well with PT, and will follow up with MD next week.  Once able plan to progress core strengthening and will see 2x/wk x 4-6 more weeks to continue  to progress functional strengthening.    Personal Factors and Comorbidities  Comorbidity 3+    Comorbidities  hx cervical ca, obesity, Lt THA, HTN    Examination-Activity Limitations  Sit;Stand;Carry;Lift;Locomotion Level;Transfers    Examination-Participation Restrictions  Other    Stability/Clinical Decision Making  Evolving/Moderate complexity    Rehab Potential  Good    PT Frequency  2x / week    PT Duration  6 weeks    PT Treatment/Interventions  ADLs/Self  Care Home Management;Cryotherapy;Electrical Stimulation;Moist Heat;Traction;Balance training;Therapeutic exercise;Therapeutic activities;Functional mobility training;Stair training;Gait training;Neuromuscular re-education;Patient/family education;Manual techniques;Taping;Dry needling;Passive range of motion    PT Next Visit Plan  progress strengthening; hamstring/ITB/piriformis stretches; functional mobility and endurance activities; see how MD visit went    PT Home Exercise Plan  Access Code: [email or web address]    Consulted and Agree with Plan of Care  Patient       Patient will benefit from skilled therapeutic intervention in order to improve the following deficits and impairments:  Abnormal gait, Decreased [place], Increased fascial restricitons, Increased muscle spasms, Pain, Difficulty walking, Decreased mobility, Decreased balance, Impaired flexibility, Postural dysfunction  Visit Diagnosis: Acute left-sided low back pain with left-sided sciatica  Muscle weakness (generalized)  Other abnormalities of gait and mobility  Difficulty in walking, not elsewhere classified     Problem List Patient Active Problem List   Diagnosis Date Noted  . DDD (degenerative disc disease), lumbar 09/02/2019    Class: Chronic  . Degenerative disc disease, lumbar 09/02/2019  . Chest tightness 09/23/2018  . Bradycardia 09/23/2018  . Labile blood pressure 03/08/2018  . Chronic low back pain 09/03/2017  . History of total hip replacement, left 01/26/2016  . EDEMA 04/28/2008  . LEG PAIN, BILATERAL 12/16/2007  . CERVICAL CANCER 07/02/2007  . MORBID OBESITY 07/02/2007  . DEPRESSION 07/02/2007  . COMMON MIGRAINE 07/02/2007  . ALLERGIC RHINITIS 07/02/2007  . ASTHMA 07/02/2007  . GERD 07/02/2007  . ELEVATED BLOOD PRESSURE WITHOUT DIAGNOSIS OF HYPERTENSION 07/02/2007      Laureen Abrahams, PT, DPT 11/20/19 12:59 PM     [facility] [address] [place], [place],  [address] Phone: [phone]   Fax:  [phone]  Name: Vanessa Medina MRN: [id number] Date of Birth: 01/25/1971

## 2019-11-20 NOTE — Progress Notes (Signed)
   Covid-19 Vaccination Clinic  Name:  SHERL YZAGUIRRE    MRN: [id number] DOB: 09/03/1971  11/20/2019  Ms. Blevens was observed post Covid-19 immunization for 30 minutes based on pre-vaccination screening without incident. She was provided with Vaccine Information Sheet and instruction to access the V-Safe system.   Ms. Kinnick was instructed to call 911 with any severe reactions post vaccine: Marland Kitchen Difficulty breathing  . Swelling of face and throat  . A fast heartbeat  . A bad rash all over body  . Dizziness and weakness   Immunizations Administered    Name Date Dose VIS Date Route   Pfizer COVID-19 Vaccine 11/20/2019  1:22 PM 0.3 mL 08/15/2019 Intramuscular   Manufacturer: [place]   Lot: [id number]   [place]: [id number]

## 2019-11-24 ENCOUNTER — Other Ambulatory Visit: Payer: Self-pay

## 2019-11-24 ENCOUNTER — Ambulatory Visit (INDEPENDENT_AMBULATORY_CARE_PROVIDER_SITE_OTHER): Payer: 59 | Admitting: Specialist

## 2019-11-24 ENCOUNTER — Encounter: Payer: Self-pay | Admitting: Specialist

## 2019-11-24 ENCOUNTER — Ambulatory Visit (INDEPENDENT_AMBULATORY_CARE_PROVIDER_SITE_OTHER): Payer: 59

## 2019-11-24 VITALS — BP 113/68 | HR 63 | Ht 64.0 in | Wt 266.0 lb

## 2019-11-24 DIAGNOSIS — Z981 Arthrodesis status: Secondary | ICD-10-CM

## 2019-11-24 NOTE — Patient Instructions (Addendum)
Avoid frequent bending and stooping  No lifting greater than 10 lbs. May use ice or moist heat for pain. Weight loss is of benefit. Continue with Vit d supplements at least 2000 IU a day and calcium supplement 1500mg  elemental calcium per day 200 mg per portion of dairy product.  Then supplement Tums twice a day of calcium citrate twice a day. Exercise is important to improve your indurance and does allow people to function better inspite of back pain. 1/2 days in brace for 6 weeks. Return to work for 1/2 days for 6 weeks.

## 2019-11-24 NOTE — Progress Notes (Signed)
Post-Op Visit Note   Patient: Vanessa Medina           Date of Birth: 1971-07-14           MRN: [id number] Visit Date: 11/24/2019 PCP: Maryland Pink, MD   Assessment & Plan:12 weeks post 3 level lumbar fusion, improved levels of pain but there is some lumbosacral pain.  Chief Complaint:  Chief Complaint  Patient presents with  . Lower Back - Follow-up  Motor is normal SLR is normal Incision is healed Radiographs with 3 mm motion. Visit Diagnoses:  1. S/P lumbar spinal fusion     Plan: Avoid frequent bending and stooping  No lifting greater than 10 lbs. May use ice or moist heat for pain. Weight loss is of benefit. Continue with Vit d supplements at least 2000 IU a day and calcium supplement 1500mg  elemental calcium per day 200 mg per portion of dairy product.  Then supplement Tums twice a day of calcium citrate twice a day. Exercise is important to improve your indurance and does allow people to function better inspite of back pain.  Follow-Up Instructions: No follow-ups on file.   Orders:  Orders Placed This Encounter  Procedures  . XR Lumbar Spine 2-3 Views   No orders of the defined types were placed in this encounter.   Imaging: No results found.  PMFS History: Patient Active Problem List   Diagnosis Date Noted  . DDD (degenerative disc disease), lumbar 09/02/2019    Priority: High    Class: Chronic  . Degenerative disc disease, lumbar 09/02/2019  . Chest tightness 09/23/2018  . Bradycardia 09/23/2018  . Labile blood pressure 03/08/2018  . Chronic low back pain 09/03/2017  . History of total hip replacement, left 01/26/2016  . EDEMA 04/28/2008  . LEG PAIN, BILATERAL 12/16/2007  . CERVICAL CANCER 07/02/2007  . MORBID OBESITY 07/02/2007  . DEPRESSION 07/02/2007  . COMMON MIGRAINE 07/02/2007  . ALLERGIC RHINITIS 07/02/2007  . ASTHMA 07/02/2007  . GERD 07/02/2007  . ELEVATED BLOOD PRESSURE WITHOUT DIAGNOSIS OF HYPERTENSION 07/02/2007   Past  Medical History:  Diagnosis Date  . Anemia   . Cervical cancer ([place])   . Family history of adverse reaction to anesthesia     " my mother takes a long time time wake up"  . GERD (gastroesophageal reflux disease)   . Headache    migraine  . History of shingles   . Hypertension   . Migraine   . Multiple allergies   . Obesity   . Osteoarthritis    left hip  . PONV (postoperative nausea and vomiting)   . Sleep apnea    does not wear CPAP  . Wears glasses     Family History  Problem Relation Age of Onset  . Renal Disease Mother   . Hypertension Mother   . Sudden Cardiac Death Mother   . Heart failure Mother   . Valvular heart disease Mother   . Heart disease Mother   . Stroke Brother   . Heart attack Brother 39  . Diabetes Other   . Breast cancer Cousin        paternal side    Past Surgical History:  Procedure Laterality Date  . ABDOMINAL HYSTERECTOMY    . APPENDECTOMY    . BILIOPANCREATIC DIVERSION     with duodenal switch laparoscopic   . CHOLECYSTECTOMY    . DIAGNOSTIC LAPAROSCOPY     LOA  . ESOPHAGOGASTRODUODENOSCOPY (EGD) WITH PROPOFOL N/A 07/25/2017  Procedure: ESOPHAGOGASTRODUODENOSCOPY (EGD) WITH PROPOFOL;  Surgeon: Manya Silvas, MD;  Location: [facility] ENDOSCOPY;  Service: Endoscopy;  Laterality: N/A;  . KNEE ARTHROSCOPY    . LAPAROSCOPIC GASTRIC SLEEVE RESECTION WITH HIATAL HERNIA REPAIR    . TONSILLECTOMY    . TOTAL HIP ARTHROPLASTY Left 01/26/2016   Procedure: LEFT TOTAL HIP ARTHROPLASTY ANTERIOR APPROACH;  Surgeon: Leandrew Koyanagi, MD;  Location: [place];  Service: Orthopedics;  Laterality: Left;   Social History   Occupational History  . Not on file  Tobacco Use  . Smoking status: Never Smoker  . Smokeless tobacco: Never Used  Substance and Sexual Activity  . Alcohol use: Yes    Comment: occasional wine  . Drug use: No  . Sexual activity: Not on file

## 2019-11-25 ENCOUNTER — Ambulatory Visit: Payer: 59 | Admitting: Physical Therapy

## 2019-11-25 ENCOUNTER — Encounter: Payer: Self-pay | Admitting: Physical Therapy

## 2019-11-25 DIAGNOSIS — R2689 Other abnormalities of gait and mobility: Secondary | ICD-10-CM

## 2019-11-25 DIAGNOSIS — R262 Difficulty in walking, not elsewhere classified: Secondary | ICD-10-CM | POA: Diagnosis not present

## 2019-11-25 DIAGNOSIS — M6281 Muscle weakness (generalized): Secondary | ICD-10-CM | POA: Diagnosis not present

## 2019-11-25 DIAGNOSIS — M5442 Lumbago with sciatica, left side: Secondary | ICD-10-CM

## 2019-11-25 NOTE — Therapy (Signed)
[facility] [address] [place], [place], [address] Phone: [phone]   Fax:  [phone]  Physical Therapy Treatment  Patient Details  Name: Vanessa Medina MRN: [id number] Date of Birth: [birth year] Referring Provider (PT): Jessy Oto, MD   Encounter Date: 11/25/2019  PT End of Session - 11/25/19 1239    Visit Number  9    Number of Visits  20    Date for PT Re-Evaluation  01/01/20    Authorization Type  [employer]    PT Start Time  5035    PT Stop Time  1215    PT Time Calculation (min)  40 min    Equipment Utilized During Treatment  Back brace    Activity Tolerance  Patient tolerated treatment well    Behavior During Therapy  [facility] for tasks assessed/performed       Past Medical History:  Diagnosis Date  . Anemia   . Cervical cancer ([place])   . Family history of adverse reaction to anesthesia     " my mother takes a long time time wake up"  . GERD (gastroesophageal reflux disease)   . Headache    migraine  . History of shingles   . Hypertension   . Migraine   . Multiple allergies   . Obesity   . Osteoarthritis    left hip  . PONV (postoperative nausea and vomiting)   . Sleep apnea    does not wear CPAP  . Wears glasses     Past Surgical History:  Procedure Laterality Date  . ABDOMINAL HYSTERECTOMY    . APPENDECTOMY    . BILIOPANCREATIC DIVERSION     with duodenal switch laparoscopic   . CHOLECYSTECTOMY    . DIAGNOSTIC LAPAROSCOPY     LOA  . ESOPHAGOGASTRODUODENOSCOPY (EGD) WITH PROPOFOL N/A 07/25/2017   Procedure: ESOPHAGOGASTRODUODENOSCOPY (EGD) WITH PROPOFOL;  Surgeon: Manya Silvas, MD;  Location: [facility] ENDOSCOPY;  Service: Endoscopy;  Laterality: N/A;  . KNEE ARTHROSCOPY    . LAPAROSCOPIC GASTRIC SLEEVE RESECTION WITH HIATAL HERNIA REPAIR    . TONSILLECTOMY    . TOTAL HIP ARTHROPLASTY Left 01/26/2016   Procedure: LEFT TOTAL HIP ARTHROPLASTY ANTERIOR APPROACH;  Surgeon: Leandrew Koyanagi, MD;  Location: [place];   Service: Orthopedics;  Laterality: Left;    There were no vitals filed for this visit.  Subjective Assessment - 11/25/19 1136    Subjective  having soreness in lower glute/sacral area.  otherwise doing well.  6 more weeks in TLSO at 1/2  a day    Pertinent History  hx cervical ca, obesity, Lt THA, HTN    Limitations  Standing;Walking;Lifting    How long can you stand comfortably?  1-2 min    How long can you walk comfortably?  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  Yes    Pain Score  5     Pain Location  Back    Pain Orientation  Lower    Pain Descriptors / Indicators  Sore    Pain Type  Surgical pain;Acute pain    Pain Onset  More than a month ago    Pain Frequency  Constant    Aggravating Factors   standing, welking, repositioning, sitting    Pain Relieving Factors  meds, heat  [facility] Treatment/Exercise - 11/25/19 1139      Lumbar Exercises: Aerobic   Nustep  L7 x 10 min      Lumbar Exercises: Standing   Heel Raises  20 reps    Functional Squats  20 reps;5 seconds   TRX   Row  Both;20 reps   TRX   Other Standing Lumbar Exercises  marching, hip abduction, hip extension, hamstring curls x 20 reps bil; 4#;                   PT Long Term Goals - 11/20/19 1254      PT LONG TERM GOAL #1   Title  independent with HEP    Status  On-going    Target Date  01/01/20      PT LONG TERM GOAL #2   Title  amb without AD independently with pain < 4/10 for improved function    Baseline  3/18: improved; has some days of no pain, other days up to 5-6/10    Status  On-going    Target Date  01/01/20      PT LONG TERM GOAL #3   Title  improve gait velocity to > 3.5 ft/sec for improved community ambulation and access    Status  Revised    Target Date  01/01/20      PT LONG TERM GOAL #4   Title  improve 5x STS to < 20 sec for improved  function    Status  Revised    Target Date  01/01/20      PT LONG TERM GOAL #5   Title  report standing and walking tolerance to at least 45 min without increase in pain for improved function    Baseline  --    Status  Revised    Target Date  01/01/20            Plan - 11/25/19 1240    Clinical Impression Statement  Pt tolerated session well today only with c/o fatigue.  Returns to work tomorrow for 1/2 days x 4 weeks.  Progressing well with PT.    Personal Factors and Comorbidities  Comorbidity 3+    Comorbidities  hx cervical ca, obesity, Lt THA, HTN    Examination-Activity Limitations  Sit;Stand;Carry;Lift;Locomotion Level;Transfers    Examination-Participation Restrictions  Other    Stability/Clinical Decision Making  Evolving/Moderate complexity    Rehab Potential  Good    PT Frequency  2x / week    PT Duration  6 weeks    PT Treatment/Interventions  ADLs/Self Care Home Management;Cryotherapy;Electrical Stimulation;Moist Heat;Traction;Balance training;Therapeutic exercise;Therapeutic activities;Functional mobility training;Stair training;Gait training;Neuromuscular re-education;Patient/family education;Manual techniques;Taping;Dry needling;Passive range of motion    PT Next Visit Plan  progress strengthening; hamstring/ITB/piriformis stretches; functional mobility and endurance activities; see how return to work went    PT Home Exercise Plan  Access Code: [email or web address]    Consulted and Agree with Plan of Care  Patient       Patient will benefit from skilled therapeutic intervention in order to improve the following deficits and impairments:  Abnormal gait, Decreased strength, Increased fascial restricitons, Increased muscle spasms, Pain, Difficulty walking, Decreased mobility, Decreased balance, Impaired flexibility, Postural dysfunction  Visit Diagnosis: Acute left-sided low back pain with left-sided sciatica  Muscle weakness (generalized)  Other abnormalities of gait and  mobility  Difficulty in walking, not elsewhere classified     Problem List Patient Active Problem List   Diagnosis Date Noted  . DDD (degenerative disc  disease), lumbar 09/02/2019    Class: Chronic  . Degenerative disc disease, lumbar 09/02/2019  . Chest tightness 09/23/2018  . Bradycardia 09/23/2018  . Labile blood pressure 03/08/2018  . Chronic low back pain 09/03/2017  . History of total hip replacement, left 01/26/2016  . EDEMA 04/28/2008  . LEG PAIN, BILATERAL 12/16/2007  . CERVICAL CANCER 07/02/2007  . MORBID OBESITY 07/02/2007  . DEPRESSION 07/02/2007  . COMMON MIGRAINE 07/02/2007  . ALLERGIC RHINITIS 07/02/2007  . ASTHMA 07/02/2007  . GERD 07/02/2007  . ELEVATED BLOOD PRESSURE WITHOUT DIAGNOSIS OF HYPERTENSION 07/02/2007      Laureen Abrahams, PT, DPT 11/25/19 12:42 PM    [facility] [address] [place], [place], [address] Phone: [phone]   Fax:  [phone]  Name: Vanessa Medina MRN: [id number] Date of Birth: [birth year]

## 2019-11-27 ENCOUNTER — Encounter: Payer: Self-pay | Admitting: Physical Therapy

## 2019-11-27 ENCOUNTER — Ambulatory Visit (INDEPENDENT_AMBULATORY_CARE_PROVIDER_SITE_OTHER): Payer: 59 | Admitting: Physical Therapy

## 2019-11-27 ENCOUNTER — Other Ambulatory Visit: Payer: Self-pay

## 2019-11-27 DIAGNOSIS — M5442 Lumbago with sciatica, left side: Secondary | ICD-10-CM | POA: Diagnosis not present

## 2019-11-27 DIAGNOSIS — R262 Difficulty in walking, not elsewhere classified: Secondary | ICD-10-CM | POA: Diagnosis not present

## 2019-11-27 DIAGNOSIS — R2689 Other abnormalities of gait and mobility: Secondary | ICD-10-CM | POA: Diagnosis not present

## 2019-11-27 DIAGNOSIS — M6281 Muscle weakness (generalized): Secondary | ICD-10-CM | POA: Diagnosis not present

## 2019-11-27 NOTE — Therapy (Signed)
[facility] [address] [place], [place], [address] Phone: [phone]   Fax:  [phone]  Physical Therapy Treatment  Patient Details  Name: Vanessa Medina MRN: [id number] Date of Birth: Apr 19, 1971 Referring Provider (PT): Jessy Oto, MD   Encounter Date: 11/27/2019  PT End of Session - 11/27/19 1225    Visit Number  10    Number of Visits  20    Date for PT Re-Evaluation  01/01/20    Authorization Type  UHC    PT Start Time  1140    PT Stop Time  1223    PT Time Calculation (min)  43 min    Equipment Utilized During Treatment  Back brace    Activity Tolerance  Patient tolerated treatment well    Behavior During Therapy  [facility] for tasks assessed/performed       Past Medical History:  Diagnosis Date  . Anemia   . Cervical cancer ([place])   . Family history of adverse reaction to anesthesia     " my mother takes a long time time wake up"  . GERD (gastroesophageal reflux disease)   . Headache    migraine  . History of shingles   . Hypertension   . Migraine   . Multiple allergies   . Obesity   . Osteoarthritis    left hip  . PONV (postoperative nausea and vomiting)   . Sleep apnea    does not wear CPAP  . Wears glasses     Past Surgical History:  Procedure Laterality Date  . ABDOMINAL HYSTERECTOMY    . APPENDECTOMY    . BILIOPANCREATIC DIVERSION     with duodenal switch laparoscopic   . CHOLECYSTECTOMY    . DIAGNOSTIC LAPAROSCOPY     LOA  . ESOPHAGOGASTRODUODENOSCOPY (EGD) WITH PROPOFOL N/A 07/25/2017   Procedure: ESOPHAGOGASTRODUODENOSCOPY (EGD) WITH PROPOFOL;  Surgeon: Manya Silvas, MD;  Location: [facility] ENDOSCOPY;  Service: Endoscopy;  Laterality: N/A;  . KNEE ARTHROSCOPY    . LAPAROSCOPIC GASTRIC SLEEVE RESECTION WITH HIATAL HERNIA REPAIR    . TONSILLECTOMY    . TOTAL HIP ARTHROPLASTY Left 01/26/2016   Procedure: LEFT TOTAL HIP ARTHROPLASTY ANTERIOR APPROACH;  Surgeon: Leandrew Koyanagi, MD;  Location: [place];   Service: Orthopedics;  Laterality: Left;    There were no vitals filed for this visit.  Subjective Assessment - 11/27/19 1142    Subjective  returned to work this week - it's been a struggle; finds herself leaning over towards different screens.    Pertinent History  hx cervical ca, obesity, Lt THA, HTN    Limitations  Standing;Walking;Lifting    How long can you stand comfortably?  1-2 min    How long can you walk comfortably?  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  Yes    Pain Score  7     Pain Location  Back    Pain Orientation  Lower    Pain Descriptors / Indicators  Sore    Pain Type  Surgical pain;Acute pain    Pain Onset  More than a month ago    Pain Frequency  Constant    Aggravating Factors   standing, walking, sitting for work    Pain Relieving Factors  meds, heat  [facility] Adult PT Treatment/Exercise - 11/27/19 1147      Lumbar Exercises: Stretches   Single Knee to Chest Stretch  Right;Left;3 reps;20 seconds    Figure 4 Stretch  20 seconds;Without overpressure;Supine;3 reps      Lumbar Exercises: Aerobic   Nustep  L7 x 10 min      Lumbar Exercises: Supine   Pelvic Tilt  20 reps;5 seconds    Clam  20 reps   L4 band; with core engaged   Bent Knee Raise  20 reps   L4 band; core engaged     Lumbar Exercises: Sidelying   Clam  Both;20 reps      Moist Heat Therapy   Number Minutes Moist Heat  10 Minutes    Moist Heat Location  Lumbar Spine   during NuStep                 PT Long Term Goals - 11/20/19 1254      PT LONG TERM GOAL #1   Title  independent with HEP    Status  On-going    Target Date  01/01/20      PT LONG TERM GOAL #2   Title  amb without AD independently with pain < 4/10 for improved function    Baseline  3/18: improved; has some days of no pain, other days up to 5-6/10    Status  On-going    Target  Date  01/01/20      PT LONG TERM GOAL #3   Title  improve gait velocity to > 3.5 ft/sec for improved community ambulation and access    Status  Revised    Target Date  01/01/20      PT LONG TERM GOAL #4   Title  improve 5x STS to < 20 sec for improved function    Status  Revised    Target Date  01/01/20      PT LONG TERM GOAL #5   Title  report standing and walking tolerance to at least 45 min without increase in pain for improved function    Baseline  --    Status  Revised    Target Date  01/01/20            Plan - 11/27/19 1226    Clinical Impression Statement  Pt with increased pain today due to return to work and increased time sitting.  Session a little lighter today to accommodate increased pain.  Overall continues to progress well with PT.    Personal Factors and Comorbidities  Comorbidity 3+    Comorbidities  hx cervical ca, obesity, Lt THA, HTN    Examination-Activity Limitations  Sit;Stand;Carry;Lift;Locomotion Level;Transfers    Examination-Participation Restrictions  Other    Stability/Clinical Decision Making  Evolving/Moderate complexity    Rehab Potential  Good    PT Frequency  2x / week    PT Duration  6 weeks    PT Treatment/Interventions  ADLs/Self Care Home Management;Cryotherapy;Electrical Stimulation;Moist Heat;Traction;Balance training;Therapeutic exercise;Therapeutic activities;Functional mobility training;Stair training;Gait training;Neuromuscular re-education;Patient/family education;Manual techniques;Taping;Dry needling;Passive range of motion    PT Next Visit Plan  progress strengthening; hamstring/ITB/piriformis stretches; functional mobility and endurance activities; modalities PRN    PT Home Exercise Plan  Access Code: [email or web address]    Consulted and Agree with Plan of Care  Patient       Patient will benefit from skilled therapeutic intervention in order to improve the following deficits and impairments:  Abnormal gait, Decreased strength, Increased  fascial restricitons,  Increased muscle spasms, Pain, Difficulty walking, Decreased mobility, Decreased balance, Impaired flexibility, Postural dysfunction  Visit Diagnosis: Acute left-sided low back pain with left-sided sciatica  Muscle weakness (generalized)  Other abnormalities of gait and mobility  Difficulty in walking, not elsewhere classified     Problem List Patient Active Problem List   Diagnosis Date Noted  . DDD (degenerative disc disease), lumbar 09/02/2019    Class: Chronic  . Degenerative disc disease, lumbar 09/02/2019  . Chest tightness 09/23/2018  . Bradycardia 09/23/2018  . Labile blood pressure 03/08/2018  . Chronic low back pain 09/03/2017  . History of total hip replacement, left 01/26/2016  . EDEMA 04/28/2008  . LEG PAIN, BILATERAL 12/16/2007  . CERVICAL CANCER 07/02/2007  . MORBID OBESITY 07/02/2007  . DEPRESSION 07/02/2007  . COMMON MIGRAINE 07/02/2007  . ALLERGIC RHINITIS 07/02/2007  . ASTHMA 07/02/2007  . GERD 07/02/2007  . ELEVATED BLOOD PRESSURE WITHOUT DIAGNOSIS OF HYPERTENSION 07/02/2007       Laureen Abrahams, PT, DPT 11/27/19 12:27 PM    [facility] [address] [place], [place], [address] Phone: [phone]   Fax:  [phone]  Name: Vanessa Medina MRN: [id number] Date of Birth: 1970-12-19

## 2019-12-02 ENCOUNTER — Ambulatory Visit: Payer: 59 | Admitting: Physical Therapy

## 2019-12-02 ENCOUNTER — Other Ambulatory Visit: Payer: Self-pay

## 2019-12-02 ENCOUNTER — Encounter: Payer: Self-pay | Admitting: Physical Therapy

## 2019-12-02 DIAGNOSIS — M6281 Muscle weakness (generalized): Secondary | ICD-10-CM

## 2019-12-02 DIAGNOSIS — R262 Difficulty in walking, not elsewhere classified: Secondary | ICD-10-CM | POA: Diagnosis not present

## 2019-12-02 DIAGNOSIS — M5442 Lumbago with sciatica, left side: Secondary | ICD-10-CM | POA: Diagnosis not present

## 2019-12-02 DIAGNOSIS — R2689 Other abnormalities of gait and mobility: Secondary | ICD-10-CM | POA: Diagnosis not present

## 2019-12-02 NOTE — Therapy (Signed)
[facility] Physical Therapy [address] [place], [place], [address] Phone: [phone]   Fax:  [phone]  Physical Therapy Treatment  Patient Details  Name: Vanessa Medina MRN: [id number] Date of Birth: 1971-02-05 Referring Provider (PT): Jessy Oto, MD   Encounter Date: 12/02/2019  PT End of Session - 12/02/19 1310    Visit Number  11    Number of Visits  20    Date for PT Re-Evaluation  01/01/20    Authorization Type  UHC    PT Start Time  5038    PT Stop Time  1223    PT Time Calculation (min)  42 min    Equipment Utilized During Treatment  Back brace    Activity Tolerance  Patient tolerated treatment well    Behavior During Therapy  [facility] for tasks assessed/performed       Past Medical History:  Diagnosis Date  . Anemia   . Cervical cancer ([place])   . Family history of adverse reaction to anesthesia     " my mother takes a long time time wake up"  . GERD (gastroesophageal reflux disease)   . Headache    migraine  . History of shingles   . Hypertension   . Migraine   . Multiple allergies   . Obesity   . Osteoarthritis    left hip  . PONV (postoperative nausea and vomiting)   . Sleep apnea    does not wear CPAP  . Wears glasses     Past Surgical History:  Procedure Laterality Date  . ABDOMINAL HYSTERECTOMY    . APPENDECTOMY    . BILIOPANCREATIC DIVERSION     with duodenal switch laparoscopic   . CHOLECYSTECTOMY    . DIAGNOSTIC LAPAROSCOPY     LOA  . ESOPHAGOGASTRODUODENOSCOPY (EGD) WITH PROPOFOL N/A 07/25/2017   Procedure: ESOPHAGOGASTRODUODENOSCOPY (EGD) WITH PROPOFOL;  Surgeon: Manya Silvas, MD;  Location: [facility] ENDOSCOPY;  Service: Endoscopy;  Laterality: N/A;  . KNEE ARTHROSCOPY    . LAPAROSCOPIC GASTRIC SLEEVE RESECTION WITH HIATAL HERNIA REPAIR    . TONSILLECTOMY    . TOTAL HIP ARTHROPLASTY Left 01/26/2016   Procedure: LEFT TOTAL HIP ARTHROPLASTY ANTERIOR APPROACH;  Surgeon: Leandrew Koyanagi, MD;  Location: [place];   Service: Orthopedics;  Laterality: Left;    There were no vitals filed for this visit.  Subjective Assessment - 12/02/19 1145    Subjective  sitting is very painful, working to take more breaks and using heat.    Pertinent History  hx cervical ca, obesity, Lt THA, HTN    Limitations  Standing;Walking;Lifting    How long can you stand comfortably?  1-2 min    How long can you walk comfortably?  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  Yes    Pain Score  5     Pain Location  Back    Pain Orientation  Lower    Pain Descriptors / Indicators  Sore    Pain Type  Acute pain;Surgical pain    Pain Onset  More than a month ago    Pain Frequency  Constant    Aggravating Factors   standing, walking, sitting for work    Pain Relieving Factors  meds, heat  [facility] PT Treatment/Exercise - 12/02/19 1146      Lumbar Exercises: Aerobic   Nustep  L7 x 10 min      Lumbar Exercises: Standing   Functional Squats  20 reps;5 seconds   TRX   Row  Both;20 reps   TRX   Other Standing Lumbar Exercises  mini squat with overhead press 2# bil UE 2x10    Other Standing Lumbar Exercises  forward step ups 2x10 without UE support; 8"                  PT Long Term Goals - 11/20/19 1254      PT LONG TERM GOAL #1   Title  independent with HEP    Status  On-going    Target Date  01/01/20      PT LONG TERM GOAL #2   Title  amb without AD independently with pain < 4/10 for improved function    Baseline  3/18: improved; has some days of no pain, other days up to 5-6/10    Status  On-going    Target Date  01/01/20      PT LONG TERM GOAL #3   Title  improve gait velocity to > 3.5 ft/sec for improved community ambulation and access    Status  Revised    Target Date  01/01/20      PT LONG TERM GOAL #4   Title  improve 5x STS to < 20 sec for improved function     Status  Revised    Target Date  01/01/20      PT LONG TERM GOAL #5   Title  report standing and walking tolerance to at least 45 min without increase in pain for improved function    Baseline  --    Status  Revised    Target Date  01/01/20            Plan - 12/02/19 1312    Clinical Impression Statement  Pt with expected fatigue after session and overall doing well with PT.  Progressing well and will continue to benefit from PT to maximize function and endurance.    Personal Factors and Comorbidities  Comorbidity 3+    Comorbidities  hx cervical ca, obesity, Lt THA, HTN    Examination-Activity Limitations  Sit;Stand;Carry;Lift;Locomotion Level;Transfers    Examination-Participation Restrictions  Other    Stability/Clinical Decision Making  Evolving/Moderate complexity    Rehab Potential  Good    PT Frequency  2x / week    PT Duration  6 weeks    PT Treatment/Interventions  ADLs/Self Care Home Management;Cryotherapy;Electrical Stimulation;Moist Heat;Traction;Balance training;Therapeutic exercise;Therapeutic activities;Functional mobility training;Stair training;Gait training;Neuromuscular re-education;Patient/family education;Manual techniques;Taping;Dry needling;Passive range of motion    PT Next Visit Plan  progress strengthening; hamstring/ITB/piriformis stretches; functional mobility and endurance activities; modalities PRN    PT Home Exercise Plan  Access Code: [email or web address]    Consulted and Agree with Plan of Care  Patient       Patient will benefit from skilled therapeutic intervention in order to improve the following deficits and impairments:  Abnormal gait, Decreased strength, Increased fascial restricitons, Increased muscle spasms, Pain, Difficulty walking, Decreased mobility, Decreased balance, Impaired flexibility, Postural dysfunction  Visit Diagnosis: Acute left-sided low back pain with left-sided sciatica  Muscle weakness (generalized)  Other abnormalities of gait  and mobility  Difficulty in walking, not elsewhere classified     Problem List Patient Active Problem List   Diagnosis Date Noted  . DDD (  degenerative disc disease), lumbar 09/02/2019    Class: Chronic  . Degenerative disc disease, lumbar 09/02/2019  . Chest tightness 09/23/2018  . Bradycardia 09/23/2018  . Labile blood pressure 03/08/2018  . Chronic low back pain 09/03/2017  . History of total hip replacement, left 01/26/2016  . EDEMA 04/28/2008  . LEG PAIN, BILATERAL 12/16/2007  . CERVICAL CANCER 07/02/2007  . MORBID OBESITY 07/02/2007  . DEPRESSION 07/02/2007  . COMMON MIGRAINE 07/02/2007  . ALLERGIC RHINITIS 07/02/2007  . ASTHMA 07/02/2007  . GERD 07/02/2007  . ELEVATED BLOOD PRESSURE WITHOUT DIAGNOSIS OF HYPERTENSION 07/02/2007        Laureen Abrahams, PT, DPT 12/02/19 1:14 PM     [facility] Physical Therapy [address] [place], [place], [address] Phone: [phone]   Fax:  [phone]  Name: Vanessa Medina MRN: [id number] Date of Birth: [birth year]

## 2019-12-04 ENCOUNTER — Other Ambulatory Visit: Payer: Self-pay

## 2019-12-04 ENCOUNTER — Encounter: Payer: Self-pay | Admitting: Physical Therapy

## 2019-12-04 ENCOUNTER — Ambulatory Visit (INDEPENDENT_AMBULATORY_CARE_PROVIDER_SITE_OTHER): Payer: 59 | Admitting: Physical Therapy

## 2019-12-04 DIAGNOSIS — M5442 Lumbago with sciatica, left side: Secondary | ICD-10-CM | POA: Diagnosis not present

## 2019-12-04 DIAGNOSIS — M6281 Muscle weakness (generalized): Secondary | ICD-10-CM | POA: Diagnosis not present

## 2019-12-04 DIAGNOSIS — R262 Difficulty in walking, not elsewhere classified: Secondary | ICD-10-CM | POA: Diagnosis not present

## 2019-12-04 DIAGNOSIS — R2689 Other abnormalities of gait and mobility: Secondary | ICD-10-CM | POA: Diagnosis not present

## 2019-12-04 NOTE — Therapy (Signed)
[facility] Physical Therapy [address] [place], [place], [address] Phone: [phone]   Fax:  [phone]  Physical Therapy Treatment  Patient Details  Name: Vanessa Medina MRN: [id number] Date of Birth: 1971-01-19 Referring Provider (PT): Jessy Oto, MD   Encounter Date: 12/04/2019  PT End of Session - 12/04/19 1242    Visit Number  12    Number of Visits  20    Date for PT Re-Evaluation  01/01/20    Authorization Type  UHC    PT Start Time  1135    PT Stop Time  1215    PT Time Calculation (min)  40 min    Equipment Utilized During Treatment  Back brace    Activity Tolerance  Patient tolerated treatment well    Behavior During Therapy  [facility] for tasks assessed/performed       Past Medical History:  Diagnosis Date  . Anemia   . Cervical cancer ([place])   . Family history of adverse reaction to anesthesia     " my mother takes a long time time wake up"  . GERD (gastroesophageal reflux disease)   . Headache    migraine  . History of shingles   . Hypertension   . Migraine   . Multiple allergies   . Obesity   . Osteoarthritis    left hip  . PONV (postoperative nausea and vomiting)   . Sleep apnea    does not wear CPAP  . Wears glasses     Past Surgical History:  Procedure Laterality Date  . ABDOMINAL HYSTERECTOMY    . APPENDECTOMY    . BILIOPANCREATIC DIVERSION     with duodenal switch laparoscopic   . CHOLECYSTECTOMY    . DIAGNOSTIC LAPAROSCOPY     LOA  . ESOPHAGOGASTRODUODENOSCOPY (EGD) WITH PROPOFOL N/A 07/25/2017   Procedure: ESOPHAGOGASTRODUODENOSCOPY (EGD) WITH PROPOFOL;  Surgeon: Manya Silvas, MD;  Location: [facility] ENDOSCOPY;  Service: Endoscopy;  Laterality: N/A;  . KNEE ARTHROSCOPY    . LAPAROSCOPIC GASTRIC SLEEVE RESECTION WITH HIATAL HERNIA REPAIR    . TONSILLECTOMY    . TOTAL HIP ARTHROPLASTY Left 01/26/2016   Procedure: LEFT TOTAL HIP ARTHROPLASTY ANTERIOR APPROACH;  Surgeon: Leandrew Koyanagi, MD;  Location: [place];   Service: Orthopedics;  Laterality: Left;    There were no vitals filed for this visit.  Subjective Assessment - 12/04/19 1138    Subjective  "I twisted today and something popped."  c/o some increased soreness.  reports she had a good day yesterday.    Pertinent History  hx cervical ca, obesity, Lt THA, HTN    Limitations  Standing;Walking;Lifting    How long can you stand comfortably?  1-2 min    How long can you walk comfortably?  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  Yes    Pain Score  5     Pain Location  Back    Pain Orientation  Lower    Pain Descriptors / Indicators  Sore    Pain Type  Acute pain;Surgical pain    Pain Onset  More than a month ago    Pain Frequency  Constant    Aggravating Factors   standing, walking, sitting for work    Pain Relieving Factors  meds, heat  [facility] PT Treatment/Exercise - 12/04/19 1140      Lumbar Exercises: Aerobic   Nustep  L7 x 10 min      Lumbar Exercises: Standing   Functional Squats  20 reps;5 seconds   TRX   Row  Both;20 reps   TRX   Other Standing Lumbar Exercises  forward/lateral step ups 2x10 without UE support; 8"                  PT Long Term Goals - 11/20/19 1254      PT LONG TERM GOAL #1   Title  independent with HEP    Status  On-going    Target Date  01/01/20      PT LONG TERM GOAL #2   Title  amb without AD independently with pain < 4/10 for improved function    Baseline  3/18: improved; has some days of no pain, other days up to 5-6/10    Status  On-going    Target Date  01/01/20      PT LONG TERM GOAL #3   Title  improve gait velocity to > 3.5 ft/sec for improved community ambulation and access    Status  Revised    Target Date  01/01/20      PT LONG TERM GOAL #4   Title  improve 5x STS to < 20 sec for improved function    Status  Revised    Target Date   01/01/20      PT LONG TERM GOAL #5   Title  report standing and walking tolerance to at least 45 min without increase in pain for improved function    Baseline  --    Status  Revised    Target Date  01/01/20            Plan - 12/04/19 1243    Clinical Impression Statement  Pt reported increased sorenes today, but overal having more better days and progressing well.  Will continue to benefit from PT to maximize function.    Personal Factors and Comorbidities  Comorbidity 3+    Comorbidities  hx cervical ca, obesity, Lt THA, HTN    Examination-Activity Limitations  Sit;Stand;Carry;Lift;Locomotion Level;Transfers    Examination-Participation Restrictions  Other    Stability/Clinical Decision Making  Evolving/Moderate complexity    Rehab Potential  Good    PT Frequency  2x / week    PT Duration  6 weeks    PT Treatment/Interventions  ADLs/Self Care Home Management;Cryotherapy;Electrical Stimulation;Moist Heat;Traction;Balance training;Therapeutic exercise;Therapeutic activities;Functional mobility training;Stair training;Gait training;Neuromuscular re-education;Patient/family education;Manual techniques;Taping;Dry needling;Passive range of motion    PT Next Visit Plan  progress strengthening; functional mobility and endurance activities; modalities PRN    PT Home Exercise Plan  Access Code: [email or web address]    Consulted and Agree with Plan of Care  Patient       Patient will benefit from skilled therapeutic intervention in order to improve the following deficits and impairments:  Abnormal gait, Decreased strength, Increased fascial restricitons, Increased muscle spasms, Pain, Difficulty walking, Decreased mobility, Decreased balance, Impaired flexibility, Postural dysfunction  Visit Diagnosis: Acute left-sided low back pain with left-sided sciatica  Muscle weakness (generalized)  Other abnormalities of gait and mobility  Difficulty in walking, not elsewhere classified     Problem  List Patient Active Problem List   Diagnosis Date Noted  . DDD (degenerative disc disease), lumbar 09/02/2019    Class: Chronic  . Degenerative disc disease, lumbar 09/02/2019  . Chest tightness 09/23/2018  .  Bradycardia 09/23/2018  . Labile blood pressure 03/08/2018  . Chronic low back pain 09/03/2017  . History of total hip replacement, left 01/26/2016  . EDEMA 04/28/2008  . LEG PAIN, BILATERAL 12/16/2007  . CERVICAL CANCER 07/02/2007  . MORBID OBESITY 07/02/2007  . DEPRESSION 07/02/2007  . COMMON MIGRAINE 07/02/2007  . ALLERGIC RHINITIS 07/02/2007  . ASTHMA 07/02/2007  . GERD 07/02/2007  . ELEVATED BLOOD PRESSURE WITHOUT DIAGNOSIS OF HYPERTENSION 07/02/2007         Laureen Abrahams, PT, DPT 12/04/19 12:45 PM     [facility] [address] [place], [place], [address] Phone: [phone]   Fax:  [phone]  Name: Vanessa Medina MRN: [id number] Date of Birth: 02/01/1971

## 2019-12-08 ENCOUNTER — Ambulatory Visit: Payer: 59 | Admitting: Physical Therapy

## 2019-12-08 ENCOUNTER — Other Ambulatory Visit: Payer: Self-pay

## 2019-12-08 DIAGNOSIS — R2689 Other abnormalities of gait and mobility: Secondary | ICD-10-CM

## 2019-12-08 DIAGNOSIS — M6281 Muscle weakness (generalized): Secondary | ICD-10-CM | POA: Diagnosis not present

## 2019-12-08 DIAGNOSIS — M5442 Lumbago with sciatica, left side: Secondary | ICD-10-CM | POA: Diagnosis not present

## 2019-12-08 DIAGNOSIS — R262 Difficulty in walking, not elsewhere classified: Secondary | ICD-10-CM

## 2019-12-08 NOTE — Therapy (Signed)
[facility] Physical Therapy [address] [place], [place], [address] Phone: [phone]   Fax:  [phone]  Physical Therapy Treatment  Patient Details  Name: Vanessa Medina MRN: [id number] Date of Birth: July 28, 1971 Referring Provider (PT): Jessy Oto, MD   Encounter Date: 12/08/2019  PT End of Session - 12/08/19 1604    Visit Number  13    Number of Visits  20    Date for PT Re-Evaluation  01/01/20    Authorization Type  UHC    PT Start Time  1540    PT Stop Time  1610    PT Time Calculation (min)  40 min    Equipment Utilized During Treatment  Back brace    Activity Tolerance  Patient tolerated treatment well    Behavior During Therapy  [facility] for tasks assessed/performed       Past Medical History:  Diagnosis Date  . Anemia   . Cervical cancer ([place])   . Family history of adverse reaction to anesthesia     " my mother takes a long time time wake up"  . GERD (gastroesophageal reflux disease)   . Headache    migraine  . History of shingles   . Hypertension   . Migraine   . Multiple allergies   . Obesity   . Osteoarthritis    left hip  . PONV (postoperative nausea and vomiting)   . Sleep apnea    does not wear CPAP  . Wears glasses     Past Surgical History:  Procedure Laterality Date  . ABDOMINAL HYSTERECTOMY    . APPENDECTOMY    . BILIOPANCREATIC DIVERSION     with duodenal switch laparoscopic   . CHOLECYSTECTOMY    . DIAGNOSTIC LAPAROSCOPY     LOA  . ESOPHAGOGASTRODUODENOSCOPY (EGD) WITH PROPOFOL N/A 07/25/2017   Procedure: ESOPHAGOGASTRODUODENOSCOPY (EGD) WITH PROPOFOL;  Surgeon: Manya Silvas, MD;  Location: [facility] ENDOSCOPY;  Service: Endoscopy;  Laterality: N/A;  . KNEE ARTHROSCOPY    . LAPAROSCOPIC GASTRIC SLEEVE RESECTION WITH HIATAL HERNIA REPAIR    . TONSILLECTOMY    . TOTAL HIP ARTHROPLASTY Left 01/26/2016   Procedure: LEFT TOTAL HIP ARTHROPLASTY ANTERIOR APPROACH;  Surgeon: Leandrew Koyanagi, MD;  Location: [place];   Service: Orthopedics;  Laterality: Left;    There were no vitals filed for this visit.  Subjective Assessment - 12/08/19 1550    Subjective  no pain, just some soreness today in her back    Pertinent History  hx cervical ca, obesity, Lt THA, HTN    Limitations  Standing;Walking;Lifting    How long can you stand comfortably?  1-2 min    How long can you walk comfortably?  5 min    Patient Stated Goals  improve pain and mobility; be able to return to work at some point (seated job)    Pain Onset  More than a month ago                       [facility] Adult PT Treatment/Exercise - 12/08/19 0001      Lumbar Exercises: Aerobic   Nustep  L7 x 10 min   LE only     Lumbar Exercises: Standing   Functional Squats  20 reps;5 seconds    Functional Squats Limitations  TRX    Row  Both;20 reps   TRX   Row Limitations  TRX    Other Standing Lumbar Exercises  marching, hip abd, H.S  curls all with 4 lb weights X 15 bilat    Other Standing Lumbar Exercises  diagnoal lift OH 2 lb ball X 10 bilat, farmers carry 10 lbs X 1 lap each arm.       Lumbar Exercises: Seated   Long Arc Quad on Chair  15 reps    LAQ on Chair Weights (lbs)  4        PT Long Term Goals - 11/20/19 1254      PT LONG TERM GOAL #1   Title  independent with HEP    Status  On-going    Target Date  01/01/20      PT LONG TERM GOAL #2   Title  amb without AD independently with pain < 4/10 for improved function    Baseline  3/18: improved; has some days of no pain, other days up to 5-6/10    Status  On-going    Target Date  01/01/20      PT LONG TERM GOAL #3   Title  improve gait velocity to > 3.5 ft/sec for improved community ambulation and access    Status  Revised    Target Date  01/01/20      PT LONG TERM GOAL #4   Title  improve 5x STS to < 20 sec for improved function    Status  Revised    Target Date  01/01/20      PT LONG TERM GOAL #5   Title  report standing and walking tolerance to at least 45  min without increase in pain for improved function    Baseline  --    Status  Revised    Target Date  01/01/20            Plan - 12/08/19 1605    Clinical Impression Statement  continued with exercise program for general strengthening with good tolerance and without complaints. Continue POC progressing strength and endurance as tolerated.    Personal Factors and Comorbidities  Comorbidity 3+    Comorbidities  hx cervical ca, obesity, Lt THA, HTN    Examination-Activity Limitations  Sit;Stand;Carry;Lift;Locomotion Level;Transfers    Examination-Participation Restrictions  Other    Stability/Clinical Decision Making  Evolving/Moderate complexity    Rehab Potential  Good    PT Frequency  2x / week    PT Duration  6 weeks    PT Treatment/Interventions  ADLs/Self Care Home Management;Cryotherapy;Electrical Stimulation;Moist Heat;Traction;Balance training;Therapeutic exercise;Therapeutic activities;Functional mobility training;Stair training;Gait training;Neuromuscular re-education;Patient/family education;Manual techniques;Taping;Dry needling;Passive range of motion    PT Next Visit Plan  progress strengthening; functional mobility and endurance activities; modalities PRN    PT Home Exercise Plan  Access Code: [email or web address]    Consulted and Agree with Plan of Care  Patient       Patient will benefit from skilled therapeutic intervention in order to improve the following deficits and impairments:  Abnormal gait, Decreased strength, Increased fascial restricitons, Increased muscle spasms, Pain, Difficulty walking, Decreased mobility, Decreased balance, Impaired flexibility, Postural dysfunction  Visit Diagnosis: Acute left-sided low back pain with left-sided sciatica  Muscle weakness (generalized)  Other abnormalities of gait and mobility  Difficulty in walking, not elsewhere classified     Problem List Patient Active Problem List   Diagnosis Date Noted  . DDD (degenerative disc  disease), lumbar 09/02/2019    Class: Chronic  . Degenerative disc disease, lumbar 09/02/2019  . Chest tightness 09/23/2018  . Bradycardia 09/23/2018  . Labile blood pressure 03/08/2018  . Chronic low back  pain 09/03/2017  . History of total hip replacement, left 01/26/2016  . EDEMA 04/28/2008  . LEG PAIN, BILATERAL 12/16/2007  . CERVICAL CANCER 07/02/2007  . MORBID OBESITY 07/02/2007  . DEPRESSION 07/02/2007  . COMMON MIGRAINE 07/02/2007  . ALLERGIC RHINITIS 07/02/2007  . ASTHMA 07/02/2007  . GERD 07/02/2007  . ELEVATED BLOOD PRESSURE WITHOUT DIAGNOSIS OF HYPERTENSION 07/02/2007    Debbe Odea, PT,DPT 12/08/2019, 4:07 PM  [facility] Physical Therapy [address] [place], [place], [address] Phone: [phone]   Fax:  [phone]  Name: Vanessa Medina MRN: [id number] Date of Birth: [birth year]

## 2019-12-12 ENCOUNTER — Encounter: Payer: Self-pay | Admitting: Physical Therapy

## 2019-12-12 ENCOUNTER — Other Ambulatory Visit: Payer: Self-pay

## 2019-12-12 ENCOUNTER — Ambulatory Visit (INDEPENDENT_AMBULATORY_CARE_PROVIDER_SITE_OTHER): Payer: 59 | Admitting: Physical Therapy

## 2019-12-12 DIAGNOSIS — M6281 Muscle weakness (generalized): Secondary | ICD-10-CM | POA: Diagnosis not present

## 2019-12-12 DIAGNOSIS — R262 Difficulty in walking, not elsewhere classified: Secondary | ICD-10-CM | POA: Diagnosis not present

## 2019-12-12 DIAGNOSIS — M5442 Lumbago with sciatica, left side: Secondary | ICD-10-CM | POA: Diagnosis not present

## 2019-12-12 DIAGNOSIS — R2689 Other abnormalities of gait and mobility: Secondary | ICD-10-CM

## 2019-12-12 NOTE — Therapy (Signed)
[facility] [address] [place], [place], [address] Phone: [phone]   Fax:  [phone]  Physical Therapy Treatment  Patient Details  Name: Vanessa Medina MRN: [id number] Date of Birth: [birth year] Referring Provider (PT): Jessy Oto, MD   Encounter Date: 12/12/2019  PT End of Session - 12/12/19 1357    Visit Number  14    Number of Visits  20    Date for PT Re-Evaluation  01/01/20    Authorization Type  UHC    PT Start Time  1350    PT Stop Time  1430    PT Time Calculation (min)  40 min    Equipment Utilized During Treatment  Back brace    Activity Tolerance  Patient tolerated treatment well    Behavior During Therapy  [facility] for tasks assessed/performed       Past Medical History:  Diagnosis Date  . Anemia   . Cervical cancer ([place])   . Family history of adverse reaction to anesthesia     " my mother takes a long time time wake up"  . GERD (gastroesophageal reflux disease)   . Headache    migraine  . History of shingles   . Hypertension   . Migraine   . Multiple allergies   . Obesity   . Osteoarthritis    left hip  . PONV (postoperative nausea and vomiting)   . Sleep apnea    does not wear CPAP  . Wears glasses     Past Surgical History:  Procedure Laterality Date  . ABDOMINAL HYSTERECTOMY    . APPENDECTOMY    . BILIOPANCREATIC DIVERSION     with duodenal switch laparoscopic   . CHOLECYSTECTOMY    . DIAGNOSTIC LAPAROSCOPY     LOA  . ESOPHAGOGASTRODUODENOSCOPY (EGD) WITH PROPOFOL N/A 07/25/2017   Procedure: ESOPHAGOGASTRODUODENOSCOPY (EGD) WITH PROPOFOL;  Surgeon: Manya Silvas, MD;  Location: [facility] ENDOSCOPY;  Service: Endoscopy;  Laterality: N/A;  . KNEE ARTHROSCOPY    . LAPAROSCOPIC GASTRIC SLEEVE RESECTION WITH HIATAL HERNIA REPAIR    . TONSILLECTOMY    . TOTAL HIP ARTHROPLASTY Left 01/26/2016   Procedure: LEFT TOTAL HIP ARTHROPLASTY ANTERIOR APPROACH;  Surgeon: Leandrew Koyanagi, MD;  Location: [place];   Service: Orthopedics;  Laterality: Left;    There were no vitals filed for this visit.  Subjective Assessment - 12/12/19 1353    Subjective  Patient reports she is doing well. She has this ache because she is doing more each day. She is back to work where she is sitting a lot more, and she is doing some gardening work as well. She is trying to bend more with her knees rather than her back.    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  Yes    Pain Score  2     Pain Location  Back    Pain Orientation  Lower    Pain Descriptors / Indicators  Aching    Pain Type  Surgical pain    Pain Onset  More than a month ago    Pain Frequency  Constant                       [facility] Adult PT Treatment/Exercise - 12/12/19 0001      Exercises   Exercises  Lumbar      Lumbar Exercises:  Standing   Lifting  10 reps   3 sets (1 x 12" step, 2 x 10" step)   Lifting Weights (lbs)  10     Lifting Limitations  cueing for proper hip hinge technique, keeping neutral spine throughout    Other Standing Lumbar Exercises  Hip abduction, extension, marching, hamstring curl with 4# 2x10 each    Other Standing Lumbar Exercises  Staggered stance diagonal lift with 5# x10 each      Lumbar Exercises: Seated   Long Arc Quad on Chair  2 sets;10 reps    LAQ on Chair Weights (lbs)  4             PT Education - 12/12/19 1355    Education Details  HEP    Person(s) Educated  Patient    Methods  Explanation;Demonstration;Verbal cues    Comprehension  Verbalized understanding;Returned demonstration;Need further instruction;Verbal cues required          PT Long Term Goals - 11/20/19 1254      PT LONG TERM GOAL #1   Title  independent with HEP    Status  On-going    Target Date  01/01/20      PT LONG TERM GOAL #2   Title  amb without AD independently with pain < 4/10 for improved function    Baseline  3/18:  improved; has some days of no pain, other days up to 5-6/10    Status  On-going    Target Date  01/01/20      PT LONG TERM GOAL #3   Title  improve gait velocity to > 3.5 ft/sec for improved community ambulation and access    Status  Revised    Target Date  01/01/20      PT LONG TERM GOAL #4   Title  improve 5x STS to < 20 sec for improved function    Status  Revised    Target Date  01/01/20      PT LONG TERM GOAL #5   Title  report standing and walking tolerance to at least 45 min without increase in pain for improved function    Baseline  --    Status  Revised    Target Date  01/01/20            Plan - 12/12/19 1357    Clinical Impression Statement  Patient is doing well with her strengthening exercises and tolerated progression in resistance and difficulty well this visit without increase in pain level. Initiated lifting mechanics this visit with good tolerance, she required cueing for proper technique and was able to demonstrate properly. She is progressing nicely toward her goals. She would benefit from continued skilled PT to progress strength and activity tolerance to maximize functional level.    PT Treatment/Interventions  ADLs/Self Care Home Management;Cryotherapy;Electrical Stimulation;Moist Heat;Traction;Balance training;Therapeutic exercise;Therapeutic activities;Functional mobility training;Stair training;Gait training;Neuromuscular re-education;Patient/family education;Manual techniques;Taping;Dry needling;Passive range of motion    PT Next Visit Plan  progress strengthening; functional mobility and endurance activities; modalities PRN    PT Home Exercise Plan  Access Code: [email or web address]    Consulted and Agree with Plan of Care  Patient       Patient will benefit from skilled therapeutic intervention in order to improve the following deficits and impairments:  Abnormal gait, Decreased strength, Increased fascial restricitons, Increased muscle spasms, Pain, Difficulty  walking, Decreased mobility, Decreased balance, Impaired flexibility, Postural dysfunction  Visit Diagnosis: Acute left-sided low back pain with left-sided sciatica  Muscle weakness (  generalized)  Other abnormalities of gait and mobility  Difficulty in walking, not elsewhere classified     Problem List Patient Active Problem List   Diagnosis Date Noted  . DDD (degenerative disc disease), lumbar 09/02/2019    Class: Chronic  . Degenerative disc disease, lumbar 09/02/2019  . Chest tightness 09/23/2018  . Bradycardia 09/23/2018  . Labile blood pressure 03/08/2018  . Chronic low back pain 09/03/2017  . History of total hip replacement, left 01/26/2016  . EDEMA 04/28/2008  . LEG PAIN, BILATERAL 12/16/2007  . CERVICAL CANCER 07/02/2007  . MORBID OBESITY 07/02/2007  . DEPRESSION 07/02/2007  . COMMON MIGRAINE 07/02/2007  . ALLERGIC RHINITIS 07/02/2007  . ASTHMA 07/02/2007  . GERD 07/02/2007  . ELEVATED BLOOD PRESSURE WITHOUT DIAGNOSIS OF HYPERTENSION 07/02/2007    Hilda Blades, PT, DPT, LAT, ATC 12/12/19  2:32 PM   [facility] [address] [place], [place], [address] Phone: [phone]   Fax:  [phone]  Name: Vanessa Medina MRN: [id number] Date of Birth: [birth year]

## 2019-12-15 ENCOUNTER — Other Ambulatory Visit: Payer: Self-pay

## 2019-12-15 ENCOUNTER — Ambulatory Visit: Payer: 59 | Admitting: Physical Therapy

## 2019-12-15 ENCOUNTER — Encounter: Payer: Self-pay | Admitting: Physical Therapy

## 2019-12-15 ENCOUNTER — Ambulatory Visit: Payer: 59 | Attending: Internal Medicine

## 2019-12-15 DIAGNOSIS — M6281 Muscle weakness (generalized): Secondary | ICD-10-CM | POA: Diagnosis not present

## 2019-12-15 DIAGNOSIS — R262 Difficulty in walking, not elsewhere classified: Secondary | ICD-10-CM | POA: Diagnosis not present

## 2019-12-15 DIAGNOSIS — M5442 Lumbago with sciatica, left side: Secondary | ICD-10-CM | POA: Diagnosis not present

## 2019-12-15 DIAGNOSIS — R2689 Other abnormalities of gait and mobility: Secondary | ICD-10-CM

## 2019-12-15 DIAGNOSIS — Z23 Encounter for immunization: Secondary | ICD-10-CM

## 2019-12-15 NOTE — Progress Notes (Signed)
   Covid-19 Vaccination Clinic  Name:  Vanessa Medina    MRN: [id number] DOB: 1971/05/12  12/15/2019  [name] was observed post Covid-19 immunization for 30 minutes based on pre-vaccination screening without incident. She was provided with Vaccine Information Sheet and instruction to access the V-Safe system.   [name] was instructed to call 911 with any severe reactions post vaccine: Marland Kitchen Difficulty breathing  . Swelling of face and throat  . A fast heartbeat  . A bad rash all over body  . Dizziness and weakness   Immunizations Administered    Name Date Dose VIS Date Route   Pfizer COVID-19 Vaccine 12/15/2019  2:27 PM 0.3 mL 08/15/2019 Intramuscular   Manufacturer: [place]   Lot: [id number]   [place]: [id number]

## 2019-12-15 NOTE — Therapy (Signed)
[facility] [address] [place], [place], [address] Phone: [phone]   Fax:  [phone]  Physical Therapy Treatment  Patient Details  Name: Vanessa Medina MRN: [id number] Date of Birth: 13-May-1971 Referring Provider (PT): Jessy Oto, MD   Encounter Date: 12/15/2019  PT End of Session - 12/15/19 1351    Visit Number  15    Number of Visits  20    Date for PT Re-Evaluation  01/01/20    Authorization Type  UHC    PT Start Time  1310    PT Stop Time  1350    PT Time Calculation (min)  40 min    Equipment Utilized During Treatment  Back brace    Activity Tolerance  Patient tolerated treatment well    Behavior During Therapy  [facility] for tasks assessed/performed       Past Medical History:  Diagnosis Date  . Anemia   . Cervical cancer ([place])   . Family history of adverse reaction to anesthesia     " my mother takes a long time time wake up"  . GERD (gastroesophageal reflux disease)   . Headache    migraine  . History of shingles   . Hypertension   . Migraine   . Multiple allergies   . Obesity   . Osteoarthritis    left hip  . PONV (postoperative nausea and vomiting)   . Sleep apnea    does not wear CPAP  . Wears glasses     Past Surgical History:  Procedure Laterality Date  . ABDOMINAL HYSTERECTOMY    . APPENDECTOMY    . BILIOPANCREATIC DIVERSION     with duodenal switch laparoscopic   . CHOLECYSTECTOMY    . DIAGNOSTIC LAPAROSCOPY     LOA  . ESOPHAGOGASTRODUODENOSCOPY (EGD) WITH PROPOFOL N/A 07/25/2017   Procedure: ESOPHAGOGASTRODUODENOSCOPY (EGD) WITH PROPOFOL;  Surgeon: Manya Silvas, MD;  [facility] ENDOSCOPY;  Service: Endoscopy;  Laterality: N/A;  . KNEE ARTHROSCOPY    . LAPAROSCOPIC GASTRIC SLEEVE RESECTION WITH HIATAL HERNIA REPAIR    . TONSILLECTOMY    . TOTAL HIP ARTHROPLASTY Left 01/26/2016   Procedure: LEFT TOTAL HIP ARTHROPLASTY ANTERIOR APPROACH;  Surgeon: Leandrew Koyanagi, MD;  Location: [place];   Service: Orthopedics;  Laterality: Left;    There were no vitals filed for this visit.  Subjective Assessment - 12/15/19 1306    Subjective  no pain today, doing well.  did some gardening yesterday and had a little pain yesterday; none today.  tried some CBD oil.    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  No/denies    Pain Onset  --                       [facility] Adult PT Treatment/Exercise - 12/15/19 1316      Lumbar Exercises: Aerobic   Nustep  L7 x 10 min      Lumbar Exercises: Standing   Functional Squats  20 reps    Functional Squats Limitations  10# KB    Row  Both;20 reps    Theraband Level (Row)  Other (comment)   L5   Other Standing Lumbar Exercises  deadlifts 2x10 with 10# KB; antirotation x20 bil with L5 band    Other Standing Lumbar Exercises  sidestepping, backwards walking, monster walk x 4 laps each: L4 band  PT Long Term Goals - 11/20/19 1254      PT LONG TERM GOAL #1   Title  independent with HEP    Status  On-going    Target Date  01/01/20      PT LONG TERM GOAL #2   Title  amb without AD independently with pain < 4/10 for improved function    Baseline  3/18: improved; has some days of no pain, other days up to 5-6/10    Status  On-going    Target Date  01/01/20      PT LONG TERM GOAL #3   Title  improve gait velocity to > 3.5 ft/sec for improved community ambulation and access    Status  Revised    Target Date  01/01/20      PT LONG TERM GOAL #4   Title  improve 5x STS to < 20 sec for improved function    Status  Revised    Target Date  01/01/20      PT LONG TERM GOAL #5   Title  report standing and walking tolerance to at least 45 min without increase in pain for improved function    Baseline  --    Status  Revised    Target Date  01/01/20            Plan - 12/15/19 1351    Clinical Impression Statement  Pt  continues to progress well with PT with strengthening exercises.  Overall doing well with PT.  Will continue to benefit from PT to maximize function, today first day arriving without pain.    PT Treatment/Interventions  ADLs/Self Care Home Management;Cryotherapy;Electrical Stimulation;Moist Heat;Traction;Balance training;Therapeutic exercise;Therapeutic activities;Functional mobility training;Stair training;Gait training;Neuromuscular re-education;Patient/family education;Manual techniques;Taping;Dry needling;Passive range of motion    PT Next Visit Plan  progress strengthening; functional mobility and endurance activities; modalities PRN    PT Home Exercise Plan  Access Code: [email or web address]    Consulted and Agree with Plan of Care  Patient       Patient will benefit from skilled therapeutic intervention in order to improve the following deficits and impairments:  Abnormal gait, Decreased strength, Increased fascial restricitons, Increased muscle spasms, Pain, Difficulty walking, Decreased mobility, Decreased balance, Impaired flexibility, Postural dysfunction  Visit Diagnosis: Acute left-sided low back pain with left-sided sciatica  Muscle weakness (generalized)  Other abnormalities of gait and mobility  Difficulty in walking, not elsewhere classified     Problem List Patient Active Problem List   Diagnosis Date Noted  . DDD (degenerative disc disease), lumbar 09/02/2019    Class: Chronic  . Degenerative disc disease, lumbar 09/02/2019  . Chest tightness 09/23/2018  . Bradycardia 09/23/2018  . Labile blood pressure 03/08/2018  . Chronic low back pain 09/03/2017  . History of total hip replacement, left 01/26/2016  . EDEMA 04/28/2008  . LEG PAIN, BILATERAL 12/16/2007  . CERVICAL CANCER 07/02/2007  . MORBID OBESITY 07/02/2007  . DEPRESSION 07/02/2007  . COMMON MIGRAINE 07/02/2007  . ALLERGIC RHINITIS 07/02/2007  . ASTHMA 07/02/2007  . GERD 07/02/2007  . ELEVATED BLOOD PRESSURE  WITHOUT DIAGNOSIS OF HYPERTENSION 07/02/2007        Laureen Abrahams, PT, DPT 12/15/19 1:52 PM      [facility] [address] [place], [place], [address] Phone: [phone]   Fax:  [phone]  Name: MARGENE CHERIAN MRN: [id number] Date of Birth: [birth year]

## 2019-12-17 ENCOUNTER — Other Ambulatory Visit: Payer: Self-pay

## 2019-12-17 ENCOUNTER — Ambulatory Visit (INDEPENDENT_AMBULATORY_CARE_PROVIDER_SITE_OTHER): Payer: 59 | Admitting: Physical Therapy

## 2019-12-17 ENCOUNTER — Encounter: Payer: Self-pay | Admitting: Physical Therapy

## 2019-12-17 DIAGNOSIS — M5442 Lumbago with sciatica, left side: Secondary | ICD-10-CM

## 2019-12-17 DIAGNOSIS — R262 Difficulty in walking, not elsewhere classified: Secondary | ICD-10-CM | POA: Diagnosis not present

## 2019-12-17 DIAGNOSIS — R2689 Other abnormalities of gait and mobility: Secondary | ICD-10-CM

## 2019-12-17 DIAGNOSIS — M6281 Muscle weakness (generalized): Secondary | ICD-10-CM | POA: Diagnosis not present

## 2019-12-17 NOTE — Therapy (Signed)
[facility] [address] [place], [place], [address] Phone: [phone]   Fax:  [phone]  Physical Therapy Treatment  Patient Details  Name: Vanessa Medina MRN: [id number] Date of Birth: 04/29/1971 Referring Provider (PT): Jessy Oto, MD   Encounter Date: 12/17/2019  PT End of Session - 12/17/19 1327    Visit Number  16    Number of Visits  20    Date for PT Re-Evaluation  01/01/20    Authorization Type  UHC    PT Start Time  1315    PT Stop Time  1356    PT Time Calculation (min)  41 min    Equipment Utilized During Treatment  Back brace    Activity Tolerance  Patient tolerated treatment well    Behavior During Therapy  [facility] for tasks assessed/performed       Past Medical History:  Diagnosis Date  . Anemia   . Cervical cancer ([place])   . Family history of adverse reaction to anesthesia     " my mother takes a long time time wake up"  . GERD (gastroesophageal reflux disease)   . Headache    migraine  . History of shingles   . Hypertension   . Migraine   . Multiple allergies   . Obesity   . Osteoarthritis    left hip  . PONV (postoperative nausea and vomiting)   . Sleep apnea    does not wear CPAP  . Wears glasses     Past Surgical History:  Procedure Laterality Date  . ABDOMINAL HYSTERECTOMY    . APPENDECTOMY    . BILIOPANCREATIC DIVERSION     with duodenal switch laparoscopic   . CHOLECYSTECTOMY    . DIAGNOSTIC LAPAROSCOPY     LOA  . ESOPHAGOGASTRODUODENOSCOPY (EGD) WITH PROPOFOL N/A 07/25/2017   Procedure: ESOPHAGOGASTRODUODENOSCOPY (EGD) WITH PROPOFOL;  Surgeon: Manya Silvas, MD;  Location: [facility] ENDOSCOPY;  Service: Endoscopy;  Laterality: N/A;  . KNEE ARTHROSCOPY    . LAPAROSCOPIC GASTRIC SLEEVE RESECTION WITH HIATAL HERNIA REPAIR    . TONSILLECTOMY    . TOTAL HIP ARTHROPLASTY Left 01/26/2016   Procedure: LEFT TOTAL HIP ARTHROPLASTY ANTERIOR APPROACH;  Surgeon: Leandrew Koyanagi, MD;  Location: [facility];   Service: Orthopedics;  Laterality: Left;    There were no vitals filed for this visit.  Subjective Assessment - 12/17/19 1324    Subjective  Pt reporting no pain at beginning of session. Pt reporting CBD oil has been helping her sleep.    Pertinent History  hx cervical ca, obesity, Lt THA, HTN    Limitations  Standing;Walking;Lifting    How long can you stand comfortably?  1-2 min    How long can you walk comfortably?  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  No/denies                       [facility] Adult PT Treatment/Exercise - 12/17/19 0001      Lumbar Exercises: Aerobic   Nustep  L7 x 7 min      Lumbar Exercises: Standing   Functional Squats  20 reps    Functional Squats Limitations  10# KB    Row  Both;20 reps    Theraband Level (Row)  Level 4 (Blue)    Other Standing Lumbar Exercises  deadlifts  2x10 with 10# KB; antirotation x20 bil with L5 band    Other Standing Lumbar Exercises  braiding, forward lunges                   PT Long Term Goals - 12/17/19 1333      PT LONG TERM GOAL #1   Title  independent with HEP    Status  On-going      PT LONG TERM GOAL #2   Title  amb without AD independently with pain < 4/10 for improved function    Baseline  3/18: improved; has some days of no pain, other days up to 5-6/10    Status  On-going      PT LONG TERM GOAL #3   Title  improve gait velocity to > 3.5 ft/sec for improved community ambulation and access    Status  Revised      PT LONG TERM GOAL #4   Title  improve 5x STS to < 20 sec for improved function    Status  Revised      PT LONG TERM GOAL #5   Title  report standing and walking tolerance to at least 45 min without increase in pain for improved function    Baseline  3/18: without UE support 30 min    Status  Revised            Plan - 12/17/19 1328    Clinical Impression Statement  Pt  continuing to tolerates exericses well and making progress with strength gains. Continue skilled PT to porgress toward LTG's set.    Personal Factors and Comorbidities  Comorbidity 3+    Comorbidities  hx cervical ca, obesity, Lt THA, HTN    Examination-Activity Limitations  Sit;Stand;Carry;Lift;Locomotion Level;Transfers    Examination-Participation Restrictions  Other    Stability/Clinical Decision Making  Evolving/Moderate complexity    Rehab Potential  Good    PT Frequency  2x / week    PT Duration  6 weeks    PT Treatment/Interventions  ADLs/Self Care Home Management;Cryotherapy;Electrical Stimulation;Moist Heat;Traction;Balance training;Therapeutic exercise;Therapeutic activities;Functional mobility training;Stair training;Gait training;Neuromuscular re-education;Patient/family education;Manual techniques;Taping;Dry needling;Passive range of motion    PT Next Visit Plan  progress strengthening; functional mobility and endurance activities; modalities PRN    PT Home Exercise Plan  Access Code: [email or web address]    Consulted and Agree with Plan of Care  Patient       Patient will benefit from skilled therapeutic intervention in order to improve the following deficits and impairments:  Abnormal gait, Decreased strength, Increased fascial restricitons, Increased muscle spasms, Pain, Difficulty walking, Decreased mobility, Decreased balance, Impaired flexibility, Postural dysfunction  Visit Diagnosis: No diagnosis found.     Problem List Patient Active Problem List   Diagnosis Date Noted  . DDD (degenerative disc disease), lumbar 09/02/2019    Class: Chronic  . Degenerative disc disease, lumbar 09/02/2019  . Chest tightness 09/23/2018  . Bradycardia 09/23/2018  . Labile blood pressure 03/08/2018  . Chronic low back pain 09/03/2017  . History of total hip replacement, left 01/26/2016  . EDEMA 04/28/2008  . LEG PAIN, BILATERAL 12/16/2007  . CERVICAL CANCER 07/02/2007  . MORBID OBESITY  07/02/2007  . DEPRESSION 07/02/2007  . COMMON MIGRAINE 07/02/2007  . ALLERGIC RHINITIS 07/02/2007  . ASTHMA 07/02/2007  . GERD 07/02/2007  . ELEVATED BLOOD PRESSURE WITHOUT DIAGNOSIS OF HYPERTENSION 07/02/2007    Oretha Caprice, MPT 12/17/2019, 2:00 PM  [facility] [address] [place], [place], [address] Phone:  [phone]   Fax:  [phone]  Name: DARRIELLE PFLIEGER MRN: [id number] Date of Birth: Jul 11, 1971

## 2019-12-19 ENCOUNTER — Other Ambulatory Visit: Payer: Self-pay | Admitting: Specialist

## 2019-12-19 NOTE — Telephone Encounter (Signed)
Please advise 

## 2019-12-23 ENCOUNTER — Encounter: Payer: Self-pay | Admitting: Physical Therapy

## 2019-12-23 ENCOUNTER — Other Ambulatory Visit: Payer: Self-pay

## 2019-12-23 ENCOUNTER — Ambulatory Visit (INDEPENDENT_AMBULATORY_CARE_PROVIDER_SITE_OTHER): Payer: 59 | Admitting: Physical Therapy

## 2019-12-23 DIAGNOSIS — R2689 Other abnormalities of gait and mobility: Secondary | ICD-10-CM | POA: Diagnosis not present

## 2019-12-23 DIAGNOSIS — M6281 Muscle weakness (generalized): Secondary | ICD-10-CM

## 2019-12-23 DIAGNOSIS — R262 Difficulty in walking, not elsewhere classified: Secondary | ICD-10-CM

## 2019-12-23 DIAGNOSIS — M5442 Lumbago with sciatica, left side: Secondary | ICD-10-CM | POA: Diagnosis not present

## 2019-12-23 NOTE — Therapy (Addendum)
[facility] [address] [place], [place], [address] Phone: [phone]   Fax:  [phone]  Physical Therapy Treatment/Discharge Summary  Patient Details  Name: Vanessa Medina MRN: [id number] Date of Birth: May 30, 1971 Referring Provider (PT): Jessy Oto, MD   Encounter Date: 12/23/2019  PT End of Session - 12/23/19 1301    Visit Number  17    Number of Visits  20    Date for PT Re-Evaluation  01/01/20    Authorization Type  U[employer]    PT Start Time  1150    PT Stop Time  1232    PT Time Calculation (min)  42 min    Equipment Utilized During Treatment  Back brace    Activity Tolerance  Patient tolerated treatment well    Behavior During Therapy  [facility] for tasks assessed/performed       Past Medical History:  Diagnosis Date  . Anemia   . Cervical cancer ([place])   . Family history of adverse reaction to anesthesia     " my mother takes a long time time wake up"  . GERD (gastroesophageal reflux disease)   . Headache    migraine  . History of shingles   . Hypertension   . Migraine   . Multiple allergies   . Obesity   . Osteoarthritis    left hip  . PONV (postoperative nausea and vomiting)   . Sleep apnea    does not wear CPAP  . Wears glasses     Past Surgical History:  Procedure Laterality Date  . ABDOMINAL HYSTERECTOMY    . APPENDECTOMY    . BILIOPANCREATIC DIVERSION     with duodenal switch laparoscopic   . CHOLECYSTECTOMY    . DIAGNOSTIC LAPAROSCOPY     LOA  . ESOPHAGOGASTRODUODENOSCOPY (EGD) WITH PROPOFOL N/A 07/25/2017   Procedure: ESOPHAGOGASTRODUODENOSCOPY (EGD) WITH PROPOFOL;  Surgeon: Manya Silvas, MD;  Location: [facility] ENDOSCOPY;  Service: Endoscopy;  Laterality: N/A;  . KNEE ARTHROSCOPY    . LAPAROSCOPIC GASTRIC SLEEVE RESECTION WITH HIATAL HERNIA REPAIR    . TONSILLECTOMY    . TOTAL HIP ARTHROPLASTY Left 01/26/2016   Procedure: LEFT TOTAL HIP ARTHROPLASTY ANTERIOR APPROACH;  Surgeon: Leandrew Koyanagi, MD;   Location: [place];  Service: Orthopedics;  Laterality: Left;    There were no vitals filed for this visit.  Subjective Assessment - 12/23/19 1211    Subjective  feels like her pain is back to what it was prior to surgery - she's not sure what has caused the increase in pain    Pertinent History  hx cervical ca, obesity, Lt THA, HTN    Limitations  Standing;Walking;Lifting    How long can you stand comfortably?  1-2 min    How long can you walk comfortably?  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  Yes    Pain Score  9     Pain Location  Back    Pain Orientation  Lower;Left    Pain Descriptors / Indicators  Sharp;Constant;Aching    Pain Type  Surgical pain    Pain Onset  More than a month ago    Pain Frequency  Constant    Aggravating Factors   standing, walking, sitting    Pain Relieving Factors  meds, heat  [place] [facility] PT Treatment/Exercise - 12/23/19 1212      Lumbar Exercises: Stretches   Passive Hamstring Stretch  3 reps;30 seconds    Passive Hamstring Stretch Limitations  seated edge of mat    Figure 4 Stretch  3 reps;30 seconds;With overpressure;Seated      Lumbar Exercises: Aerobic   Nustep  L5 x 10 min      Lumbar Exercises: Seated   Long Arc Quad on Chair  2 sets;10 reps    LAQ on Chair Weights (lbs)  3    Hip Flexion on Ball  Strengthening;Both;20 reps   seated on mat table; 3#     Moist Heat Therapy   Number Minutes Moist Heat  10 Minutes    Moist Heat Location  Lumbar Spine   on NuStep                 PT Long Term Goals - 12/17/19 1333      PT LONG TERM GOAL #1   Title  independent with HEP    Status  On-going      PT LONG TERM GOAL #2   Title  amb without AD independently with pain < 4/10 for improved function    Baseline  3/18: improved; has some days of no pain, other days up to 5-6/10    Status  On-going       PT LONG TERM GOAL #3   Title  improve gait velocity to > 3.5 ft/sec for improved community ambulation and access    Status  Revised      PT LONG TERM GOAL #4   Title  improve 5x STS to < 20 sec for improved function    Status  Revised      PT LONG TERM GOAL #5   Title  report standing and walking tolerance to at least 45 min without increase in pain for improved function    Baseline  3/18: without UE support 30 min    Status  Revised            Plan - 12/23/19 1301    Clinical Impression Statement  Pt arrived with elevated pain x 6 days rated 9-10/10 so seated session perfomed today with lighter activity and heat with NuStep.  Advised her to reach out to MD if symptoms do not improve.  Will continue to benefit from PT to maximize function.    Personal Factors and Comorbidities  Comorbidity 3+    Comorbidities  hx cervical ca, obesity, Lt THA, HTN    Examination-Activity Limitations  Sit;Stand;Carry;Lift;Locomotion Level;Transfers    Examination-Participation Restrictions  Other    Stability/Clinical Decision Making  Evolving/Moderate complexity    Rehab Potential  Good    PT Frequency  2x / week    PT Duration  6 weeks    PT Treatment/Interventions  ADLs/Self Care Home Management;Cryotherapy;Electrical Stimulation;Moist Heat;Traction;Balance training;Therapeutic exercise;Therapeutic activities;Functional mobility training;Stair training;Gait training;Neuromuscular re-education;Patient/family education;Manual techniques;Taping;Dry needling;Passive range of motion    PT Next Visit Plan  progress strengthening; functional mobility and endurance activities; modalities PRN; see how she's doing and continue as able    PT Home Exercise Plan  Access Code: [email or web address]    Consulted and Agree with Plan of Care  Patient       Patient will benefit from skilled therapeutic intervention in order to improve the following deficits and impairments:  Abnormal gait, Decreased strength, Increased  fascial restricitons, Increased muscle spasms, Pain, Difficulty walking, Decreased mobility, Decreased balance, Impaired flexibility, Postural  dysfunction  Visit Diagnosis: Acute left-sided low back pain with left-sided sciatica  Muscle weakness (generalized)  Other abnormalities of gait and mobility  Difficulty in walking, not elsewhere classified     Problem List Patient Active Problem List   Diagnosis Date Noted  . DDD (degenerative disc disease), lumbar 09/02/2019    Class: Chronic  . Degenerative disc disease, lumbar 09/02/2019  . Chest tightness 09/23/2018  . Bradycardia 09/23/2018  . Labile blood pressure 03/08/2018  . Chronic low back pain 09/03/2017  . History of total hip replacement, left 01/26/2016  . EDEMA 04/28/2008  . LEG PAIN, BILATERAL 12/16/2007  . CERVICAL CANCER 07/02/2007  . MORBID OBESITY 07/02/2007  . DEPRESSION 07/02/2007  . COMMON MIGRAINE 07/02/2007  . ALLERGIC RHINITIS 07/02/2007  . ASTHMA 07/02/2007  . GERD 07/02/2007  . ELEVATED BLOOD PRESSURE WITHOUT DIAGNOSIS OF HYPERTENSION 07/02/2007      Laureen Abrahams, PT, DPT 12/23/19 1:04 PM    [facility] [address] [place], [place], [address] Phone: [phone]   Fax:  [phone]  Name: Vanessa Medina MRN: [id number] Date of Birth: June 18, 1971    PHYSICAL THERAPY DISCHARGE SUMMARY  Visits from Start of Care: 17  Current functional level related to goals / functional outcomes: See above   Remaining deficits: See above, pt also undergoing additional medical workup for increased pain   Education / Equipment: HEP  Plan: Patient agrees to discharge.  Patient goals were partially met. Patient is being discharged due to a change in medical status.  ?????    Laureen Abrahams, PT, DPT 02/10/20 9:51 AM  [facility] [address] [place], [place], [address] Phone: [phone]   Fax:   [phone]

## 2019-12-25 ENCOUNTER — Telehealth: Payer: Self-pay | Admitting: Specialist

## 2019-12-25 ENCOUNTER — Ambulatory Visit (INDEPENDENT_AMBULATORY_CARE_PROVIDER_SITE_OTHER): Payer: 59 | Admitting: Surgery

## 2019-12-25 ENCOUNTER — Ambulatory Visit: Payer: Self-pay

## 2019-12-25 ENCOUNTER — Ambulatory Visit: Payer: 59 | Admitting: Physical Therapy

## 2019-12-25 ENCOUNTER — Encounter: Payer: Self-pay | Admitting: Surgery

## 2019-12-25 ENCOUNTER — Telehealth: Payer: Self-pay

## 2019-12-25 ENCOUNTER — Other Ambulatory Visit: Payer: Self-pay

## 2019-12-25 ENCOUNTER — Encounter: Payer: Self-pay | Admitting: Physical Therapy

## 2019-12-25 DIAGNOSIS — Z981 Arthrodesis status: Secondary | ICD-10-CM | POA: Diagnosis not present

## 2019-12-25 DIAGNOSIS — M533 Sacrococcygeal disorders, not elsewhere classified: Secondary | ICD-10-CM

## 2019-12-25 DIAGNOSIS — M7062 Trochanteric bursitis, left hip: Secondary | ICD-10-CM | POA: Diagnosis not present

## 2019-12-25 DIAGNOSIS — G8929 Other chronic pain: Secondary | ICD-10-CM | POA: Diagnosis not present

## 2019-12-25 DIAGNOSIS — R2689 Other abnormalities of gait and mobility: Secondary | ICD-10-CM

## 2019-12-25 DIAGNOSIS — R262 Difficulty in walking, not elsewhere classified: Secondary | ICD-10-CM

## 2019-12-25 DIAGNOSIS — M6281 Muscle weakness (generalized): Secondary | ICD-10-CM

## 2019-12-25 DIAGNOSIS — M5442 Lumbago with sciatica, left side: Secondary | ICD-10-CM

## 2019-12-25 MED ORDER — HYDROCODONE-ACETAMINOPHEN 5-325 MG PO TABS: 1.0000 | ORAL_TABLET | Freq: Three times a day (TID) | ORAL | 0 refills | Status: DC | PRN

## 2019-12-25 MED ORDER — METHYLPREDNISOLONE 4 MG PO TABS: ORAL_TABLET | ORAL | 0 refills | Status: DC

## 2019-12-25 MED ORDER — KETOROLAC TROMETHAMINE 30 MG/ML IJ SOLN: 30.0000 mg | Freq: Once | INTRAMUSCULAR | Status: AC

## 2019-12-25 MED ORDER — METHYLPREDNISOLONE ACETATE 80 MG/ML IJ SUSP: 80.0000 mg | Freq: Once | INTRAMUSCULAR | Status: DC

## 2019-12-25 NOTE — Telephone Encounter (Signed)
Patient is here for PT. She is in a lot of pain. Would like to see Dr. Louanne Skye before May 3. Her call back number is [phone]

## 2019-12-25 NOTE — Progress Notes (Signed)
Office Visit Note   Patient: Vanessa Medina           Date of Birth: 09-21-1970           MRN: [id number] Visit Date: 12/25/2019              Requested by: Maryland Pink, MD [address] [place],  [place] [address] PCP: Maryland Pink, MD   Assessment & Plan: Visit Diagnoses:  1. S/P lumbar spinal fusion   2. Chronic left SI joint pain   3. Greater trochanteric bursitis of left hip     Plan: Since patient's pain has only been going on for a week I think I can try to manage this conservatively before getting new imaging studies.  She is not describing any true nerve issue.  She was given a Depo-Medrol 80 mg and Toradol 30 mg IM injections today.  I sent in prescriptions for Medrol Dosepak 6-day taper which she will start tomorrow along with Norco 5/325.  She can continue her Robaxin at home.  Follow-up with me in 1 week for recheck.  If she still continues to have pain centered around the left SI joint I will see if Dr. Legrand Como hilts can perform an ultrasound-guided left SI joint Marcaine/Depo-Medrol injection at that time.  Return sooner if needed.  Patient is currently working at home and I did offer to take her out for a week but she wanted to try to see if she can continue.  Follow-Up Instructions: Return in about 1 week (around 01/01/2020) for with Xyla Leisner.  schedule morning appointment.   Orders:  Orders Placed This Encounter  Procedures  . XR Lumb Spine Flex&Ext Only   Meds ordered this encounter  Medications  . methylPREDNISolone acetate (DEPO-MEDROL) injection 80 mg  . ketorolac (TORADOL) 30 MG/ML injection 30 mg  . methylPREDNISolone (MEDROL) 4 MG tablet    Sig: 6 day taper to be taken as directed.    Dispense:  21 tablet    Refill:  0  . HYDROcodone-acetaminophen (NORCO/VICODIN) 5-325 MG tablet    Sig: Take 1 tablet by mouth every 8 (eight) hours as needed for moderate pain.    Dispense:  30 tablet    Refill:  0      Procedures: No  procedures performed   Clinical Data: No additional findings.   Subjective: Chief Complaint  Patient presents with  . Lower Back - Pain    HPI 49 year old Y female who is status post TRANSFORAMINAL LUMBAR INTERBODY FUSION LEFT LUMBAR TWO THROUGH LUMBAR THREE, LUMBAR THREE THROUGH LUMBAR FOUR, LUMBAR FOUR THROUGH LUMBAR FIVE WITH PEDICLE SCREWS, RODS, CAGES, LOCAL BONE GRAFT, ALLOGRAFT BONE GRAFT, VIVIGEN was worked into my schedule today at the request of the physical therapist due to acute left low back pain.  Patient has apparently been doing very well up until about a week ago when she had pain where she localized to the left SI joint, left buttock and left lateral hip.  She denied any injury.  No complaints of numbness tingling weakness.  Pain when she is ambulating and laying flat on her back.  No symptoms on the right side.  Physical therapy states patient's pain was out of the ordinary.   Objective: Vital Signs: There were no vitals taken for this visit.  Physical Exam HENT:     Head: Normocephalic and atraumatic.  Pulmonary:     Effort: No respiratory distress.  Musculoskeletal:     Comments: Gait  is antalgic.  Patient does have left lumbar paraspinal tenderness/spasm.  Marked tenderness over the left SI joint.  Nontender on the right.  No sciatic notch tenderness.  Moderate to marked tenderness over the left hip greater trochanter bursa and this tenderness extends down the course of her IT band.  Nontender on the right.  Negative straight leg raise.  Positive left FABER test and negative on the right.  Skin:    General: Skin is warm and dry.  Neurological:     General: No focal deficit present.     Mental Status: She is alert and oriented to person, place, and time.     Ortho Exam  Specialty Comments:  No specialty comments available.  Imaging: No results found.   PMFS History: Patient Active Problem List   Diagnosis Date Noted  . DDD (degenerative disc  disease), lumbar 09/02/2019    Class: Chronic  . Degenerative disc disease, lumbar 09/02/2019  . Chest tightness 09/23/2018  . Bradycardia 09/23/2018  . Labile blood pressure 03/08/2018  . Chronic low back pain 09/03/2017  . History of total hip replacement, left 01/26/2016  . EDEMA 04/28/2008  . LEG PAIN, BILATERAL 12/16/2007  . CERVICAL CANCER 07/02/2007  . MORBID OBESITY 07/02/2007  . DEPRESSION 07/02/2007  . COMMON MIGRAINE 07/02/2007  . ALLERGIC RHINITIS 07/02/2007  . ASTHMA 07/02/2007  . GERD 07/02/2007  . ELEVATED BLOOD PRESSURE WITHOUT DIAGNOSIS OF HYPERTENSION 07/02/2007   Past Medical History:  Diagnosis Date  . Anemia   . Cervical cancer ([place])   . Family history of adverse reaction to anesthesia     " my mother takes a long time time wake up"  . GERD (gastroesophageal reflux disease)   . Headache    migraine  . History of shingles   . Hypertension   . Migraine   . Multiple allergies   . Obesity   . Osteoarthritis    left hip  . PONV (postoperative nausea and vomiting)   . Sleep apnea    does not wear CPAP  . Wears glasses     Family History  Problem Relation Age of Onset  . Renal Disease Mother   . Hypertension Mother   . Sudden Cardiac Death Mother   . Heart failure Mother   . Valvular heart disease Mother   . Heart disease Mother   . Stroke Brother   . Heart attack Brother 15  . Diabetes Other   . Breast cancer Cousin        paternal side    Past Surgical History:  Procedure Laterality Date  . ABDOMINAL HYSTERECTOMY    . APPENDECTOMY    . BILIOPANCREATIC DIVERSION     with duodenal switch laparoscopic   . CHOLECYSTECTOMY    . DIAGNOSTIC LAPAROSCOPY     LOA  . ESOPHAGOGASTRODUODENOSCOPY (EGD) WITH PROPOFOL N/A 07/25/2017   Procedure: ESOPHAGOGASTRODUODENOSCOPY (EGD) WITH PROPOFOL;  Surgeon: Manya Silvas, MD;  Location: [facility] ENDOSCOPY;  Service: Endoscopy;  Laterality: N/A;  . KNEE ARTHROSCOPY    . LAPAROSCOPIC GASTRIC SLEEVE RESECTION  WITH HIATAL HERNIA REPAIR    . TONSILLECTOMY    . TOTAL HIP ARTHROPLASTY Left 01/26/2016   Procedure: LEFT TOTAL HIP ARTHROPLASTY ANTERIOR APPROACH;  Surgeon: Leandrew Koyanagi, MD;  Location: [place];  Service: Orthopedics;  Laterality: Left;   Social History   Occupational History  . Not on file  Tobacco Use  . Smoking status: Never Smoker  . Smokeless tobacco: Never Used  Substance and Sexual  Activity  . Alcohol use: Yes    Comment: occasional wine  . Drug use: No  . Sexual activity: Not on file

## 2019-12-25 NOTE — Telephone Encounter (Signed)
I put her on the cancellation list--we will call if something opens sooner.

## 2019-12-25 NOTE — Therapy (Signed)
[facility] [address] [place], [place], [address] Phone: [phone]   Fax:  [phone]  Physical Therapy No Charge Visit  Patient Details  Name: Vanessa Medina MRN: [id number] Date of Birth: [birth year] Referring Provider (PT): Jessy Oto, MD   Encounter Date: 12/25/2019  PT End of Session - 12/25/19 1204    Visit Number  17    Number of Visits  20    Date for PT Re-Evaluation  01/01/20    Authorization Type  [employer]    Equipment Utilized During Treatment  Back brace    Activity Tolerance  Patient tolerated treatment well    Behavior During Therapy  [facility] for tasks assessed/performed       Past Medical History:  Diagnosis Date  . Anemia   . Cervical cancer ([place])   . Family history of adverse reaction to anesthesia     " my mother takes a long time time wake up"  . GERD (gastroesophageal reflux disease)   . Headache    migraine  . History of shingles   . Hypertension   . Migraine   . Multiple allergies   . Obesity   . Osteoarthritis    left hip  . PONV (postoperative nausea and vomiting)   . Sleep apnea    does not wear CPAP  . Wears glasses     Past Surgical History:  Procedure Laterality Date  . ABDOMINAL HYSTERECTOMY    . APPENDECTOMY    . BILIOPANCREATIC DIVERSION     with duodenal switch laparoscopic   . CHOLECYSTECTOMY    . DIAGNOSTIC LAPAROSCOPY     LOA  . ESOPHAGOGASTRODUODENOSCOPY (EGD) WITH PROPOFOL N/A 07/25/2017   Procedure: ESOPHAGOGASTRODUODENOSCOPY (EGD) WITH PROPOFOL;  Surgeon: Manya Silvas, MD;  Location: [facility] ENDOSCOPY;  Service: Endoscopy;  Laterality: N/A;  . KNEE ARTHROSCOPY    . LAPAROSCOPIC GASTRIC SLEEVE RESECTION WITH HIATAL HERNIA REPAIR    . TONSILLECTOMY    . TOTAL HIP ARTHROPLASTY Left 01/26/2016   Procedure: LEFT TOTAL HIP ARTHROPLASTY ANTERIOR APPROACH;  Surgeon: Leandrew Koyanagi, MD;  Location: [place];  Service: Orthopedics;  Laterality: Left;    There were no vitals filed for this  visit.  Subjective Assessment - 12/25/19 1151    Subjective  worsening pain still - Dr. Louanne Skye doesn't have anything available until May.    Pertinent History  hx cervical ca, obesity, Lt THA, HTN    Limitations  Standing;Walking;Lifting    How long can you stand comfortably?  1-2 min    How long can you walk comfortably?  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  Yes    Pain Score  10-Worst pain ever    Pain Location  Back    Pain Orientation  Left;Lower    Pain Descriptors / Indicators  Sharp;Spasm    Pain Type  Surgical pain    Pain Onset  More than a month ago    Pain Frequency  Constant    Aggravating Factors   standing, walking, sitting    Pain Relieving Factors  meds (gabapentin)                                    PT Long Term Goals - 12/17/19 1333  PT LONG TERM GOAL #1   Title  independent with HEP    Status  On-going      PT LONG TERM GOAL #2   Title  amb without AD independently with pain < 4/10 for improved function    Baseline  3/18: improved; has some days of no pain, other days up to 5-6/10    Status  On-going      PT LONG TERM GOAL #3   Title  improve gait velocity to > 3.5 ft/sec for improved community ambulation and access    Status  Revised      PT LONG TERM GOAL #4   Title  improve 5x STS to < 20 sec for improved function    Status  Revised      PT LONG TERM GOAL #5   Title  report standing and walking tolerance to at least 45 min without increase in pain for improved function    Baseline  3/18: without UE support 30 min    Status  Revised            Plan - 12/25/19 1204    Clinical Impression Statement  Pt arrived to session with pain worse than prior session, and continues to have increased pain and limited tolerance to activity at this time.  Recommended she follow up with MD due to severity of pain.  Pt scheduled with  PA for this afternoon.  She does have muscle relaxer prescribed but hasn't taken at this time.    Personal Factors and Comorbidities  Comorbidity 3+    Comorbidities  hx cervical ca, obesity, Lt THA, HTN    Examination-Activity Limitations  Sit;Stand;Carry;Lift;Locomotion Level;Transfers    Examination-Participation Restrictions  Other    Stability/Clinical Decision Making  Evolving/Moderate complexity    Rehab Potential  Good    PT Frequency  2x / week    PT Duration  6 weeks    PT Treatment/Interventions  ADLs/Self Care Home Management;Cryotherapy;Electrical Stimulation;Moist Heat;Traction;Balance training;Therapeutic exercise;Therapeutic activities;Functional mobility training;Stair training;Gait training;Neuromuscular re-education;Patient/family education;Manual techniques;Taping;Dry needling;Passive range of motion    PT Next Visit Plan  progress strengthening; functional mobility and endurance activities; modalities PRN; follow up after PA appt    PT Home Exercise Plan  Access Code: [email or web address]    Consulted and Agree with Plan of Care  Patient       Patient will benefit from skilled therapeutic intervention in order to improve the following deficits and impairments:  Abnormal gait, Decreased strength, Increased fascial restricitons, Increased muscle spasms, Pain, Difficulty walking, Decreased mobility, Decreased balance, Impaired flexibility, Postural dysfunction  Visit Diagnosis: Acute left-sided low back pain with left-sided sciatica  Muscle weakness (generalized)  Other abnormalities of gait and mobility  Difficulty in walking, not elsewhere classified     Problem List Patient Active Problem List   Diagnosis Date Noted  . DDD (degenerative disc disease), lumbar 09/02/2019    Class: Chronic  . Degenerative disc disease, lumbar 09/02/2019  . Chest tightness 09/23/2018  . Bradycardia 09/23/2018  . Labile blood pressure 03/08/2018  . Chronic low back pain 09/03/2017  .  History of total hip replacement, left 01/26/2016  . EDEMA 04/28/2008  . LEG PAIN, BILATERAL 12/16/2007  . CERVICAL CANCER 07/02/2007  . MORBID OBESITY 07/02/2007  . DEPRESSION 07/02/2007  . COMMON MIGRAINE 07/02/2007  . ALLERGIC RHINITIS 07/02/2007  . ASTHMA 07/02/2007  . GERD 07/02/2007  . ELEVATED BLOOD PRESSURE WITHOUT DIAGNOSIS OF HYPERTENSION 07/02/2007     Laureen Abrahams, PT, DPT  12/25/19 12:06 PM    [facility] [address] [place], [place], [address] Phone: [phone]   Fax:  [phone]  Name: FLOYD LUSIGNAN MRN: [id number] Date of Birth: 1971/04/05

## 2019-12-25 NOTE — Telephone Encounter (Signed)
Per Benjiman Core, give patient injections- Toradol 30mg (1cc) & Depo 80mg (1cc). Depo given in Left buttock & Toradol Given in Right buttock. She tolerated it well.

## 2019-12-30 ENCOUNTER — Encounter: Payer: Self-pay | Admitting: Physical Therapy

## 2020-01-01 ENCOUNTER — Ambulatory Visit: Payer: 59 | Admitting: Surgery

## 2020-01-01 ENCOUNTER — Encounter: Payer: Self-pay | Admitting: Physical Therapy

## 2020-01-05 ENCOUNTER — Ambulatory Visit (INDEPENDENT_AMBULATORY_CARE_PROVIDER_SITE_OTHER): Payer: 59 | Admitting: Specialist

## 2020-01-05 ENCOUNTER — Encounter: Payer: Self-pay | Admitting: Specialist

## 2020-01-05 ENCOUNTER — Other Ambulatory Visit: Payer: Self-pay

## 2020-01-05 VITALS — BP 132/79 | HR 82 | Ht 65.0 in | Wt 260.0 lb

## 2020-01-05 DIAGNOSIS — M7062 Trochanteric bursitis, left hip: Secondary | ICD-10-CM | POA: Diagnosis not present

## 2020-01-05 DIAGNOSIS — Z4889 Encounter for other specified surgical aftercare: Secondary | ICD-10-CM

## 2020-01-05 DIAGNOSIS — Z981 Arthrodesis status: Secondary | ICD-10-CM

## 2020-01-05 NOTE — Progress Notes (Signed)
Office Visit Note   Patient: Vanessa Medina           Date of Birth: [birth year]           MRN: [id number] Visit Date: 01/05/2020              Requested by: Maryland Pink, MD [address] [place],  [place] [address] PCP: Maryland Pink, MD   Assessment & Plan: Visit Diagnoses:  1. Trochanteric bursitis, left hip   2. Encounter for other specified surgical aftercare   3. S/P lumbar spinal fusion     Plan: Tylenol for pain at this point.  Avoid frequent bending and stooping  No lifting greater than 10 lbs. May use ice or moist heat for pain. Weight loss is of benefit. Dr Ernestina Patches for left trochanteric bursa injection. Exercise is important to improve your indurance and does allow people to function better inspite of back pain.   Follow-Up Instructions: Return in about 4 days (around 01/09/2020).   Orders:  Orders Placed This Encounter  Procedures  . CT LUMBAR SPINE WO CONTRAST  . Ambulatory referral to Physical Medicine Rehab   No orders of the defined types were placed in this encounter.     Procedures: No procedures performed   Clinical Data: No additional findings.   Subjective: Chief Complaint  Patient presents with  . Lower Back - Follow-up    49  Year old female now about 4 months post op Lumbar fusions at L2-3, L3-4 and L4-5 for primarily lumbar DDD. She reports increasing left hip and buttock pain. Seen by PT and having difficulty with using the left hip and any attempted left hip flexion was weak. No bowel or bladder difficulty. She does okay with standing and walking. Reports some days she wobbles and weaves.  Has had a complete hysterectomy and is taking no hormones. She has one remaining ovary.    Review of Systems   Objective: Vital Signs: BP 132/79 (BP Location: Left Arm, Patient Position: Sitting)   Pulse 82   Ht 5\' 5"  (1.651 m)   Wt 260 lb (117.9 kg)   BMI 43.27 kg/m   Physical Exam  Left Hip Exam   Tenderness    The patient is experiencing tenderness in the greater trochanter.  Tests  FABER: negative Ober: positive  Other  Erythema: absent Sensation: normal      Specialty Comments:  No specialty comments available.  Imaging: No results found.   PMFS History: Patient Active Problem List   Diagnosis Date Noted  . DDD (degenerative disc disease), lumbar 09/02/2019    Priority: High    Class: Chronic  . Degenerative disc disease, lumbar 09/02/2019  . Chest tightness 09/23/2018  . Bradycardia 09/23/2018  . Labile blood pressure 03/08/2018  . Chronic low back pain 09/03/2017  . History of total hip replacement, left 01/26/2016  . EDEMA 04/28/2008  . LEG PAIN, BILATERAL 12/16/2007  . CERVICAL CANCER 07/02/2007  . MORBID OBESITY 07/02/2007  . DEPRESSION 07/02/2007  . COMMON MIGRAINE 07/02/2007  . ALLERGIC RHINITIS 07/02/2007  . ASTHMA 07/02/2007  . GERD 07/02/2007  . ELEVATED BLOOD PRESSURE WITHOUT DIAGNOSIS OF HYPERTENSION 07/02/2007   Past Medical History:  Diagnosis Date  . Anemia   . Cervical cancer ([place])   . Family history of adverse reaction to anesthesia     " my mother takes a long time time wake up"  . GERD (gastroesophageal reflux disease)   . Headache  migraine  . History of shingles   . Hypertension   . Migraine   . Multiple allergies   . Obesity   . Osteoarthritis    left hip  . PONV (postoperative nausea and vomiting)   . Sleep apnea    does not wear CPAP  . Wears glasses     Family History  Problem Relation Age of Onset  . Renal Disease Mother   . Hypertension Mother   . Sudden Cardiac Death Mother   . Heart failure Mother   . Valvular heart disease Mother   . Heart disease Mother   . Stroke Brother   . Heart attack Brother 58  . Diabetes Other   . Breast cancer Cousin        paternal side    Past Surgical History:  Procedure Laterality Date  . ABDOMINAL HYSTERECTOMY    . APPENDECTOMY    . BILIOPANCREATIC DIVERSION     with  duodenal switch laparoscopic   . CHOLECYSTECTOMY    . DIAGNOSTIC LAPAROSCOPY     LOA  . ESOPHAGOGASTRODUODENOSCOPY (EGD) WITH PROPOFOL N/A 07/25/2017   Procedure: ESOPHAGOGASTRODUODENOSCOPY (EGD) WITH PROPOFOL;  Surgeon: Manya Silvas, MD;  Location: [facility] ENDOSCOPY;  Service: Endoscopy;  Laterality: N/A;  . KNEE ARTHROSCOPY    . LAPAROSCOPIC GASTRIC SLEEVE RESECTION WITH HIATAL HERNIA REPAIR    . TONSILLECTOMY    . TOTAL HIP ARTHROPLASTY Left 01/26/2016   Procedure: LEFT TOTAL HIP ARTHROPLASTY ANTERIOR APPROACH;  Surgeon: Leandrew Koyanagi, MD;  Location: [place];  Service: Orthopedics;  Laterality: Left;   Social History   Occupational History  . Not on file  Tobacco Use  . Smoking status: Never Smoker  . Smokeless tobacco: Never Used  Substance and Sexual Activity  . Alcohol use: Yes    Comment: occasional wine  . Drug use: No  . Sexual activity: Not on file

## 2020-01-05 NOTE — Patient Instructions (Addendum)
Tylenol for pain at this point.  Avoid frequent bending and stooping  No lifting greater than 10 lbs. May use ice or moist heat for pain. Weight loss is of benefit. Dr Ernestina Patches for left trochanteric bursa injection. Exercise is important to improve your indurance and does allow people to function better inspite of back pain.

## 2020-01-07 ENCOUNTER — Other Ambulatory Visit: Payer: Self-pay

## 2020-01-07 ENCOUNTER — Encounter: Payer: Self-pay | Admitting: Family Medicine

## 2020-01-07 ENCOUNTER — Ambulatory Visit: Payer: 59 | Admitting: Family Medicine

## 2020-01-07 ENCOUNTER — Ambulatory Visit: Payer: Self-pay

## 2020-01-07 DIAGNOSIS — M7062 Trochanteric bursitis, left hip: Secondary | ICD-10-CM

## 2020-01-07 NOTE — Progress Notes (Signed)
Subjective: She is here for ultrasound-guided left greater trochanter injection.  Posterior lateral hip pain status post hip replacement and lumbar fusions.  Objective: She is point tender over the greater trochanter.  Procedure: Ultrasound-guided greater trochanter injection: After sterile prep with Betadine, injected 5 cc 1% lidocaine without epinephrine and 40 mg methylprednisolone using a 22-gauge spinal needle, passing the needle into the gluteus medius tendon at the insertion on the greater trochanter.  There is a probable split tear in the tendon in the region of her pain.  She had very good immediate relief during the anesthetic phase.  Follow-up as directed.

## 2020-01-08 ENCOUNTER — Other Ambulatory Visit: Payer: Self-pay | Admitting: Specialist

## 2020-01-20 ENCOUNTER — Other Ambulatory Visit: Payer: 59

## 2020-01-24 ENCOUNTER — Other Ambulatory Visit: Payer: Self-pay | Admitting: Specialist

## 2020-01-26 ENCOUNTER — Ambulatory Visit: Payer: 59 | Admitting: Physical Medicine and Rehabilitation

## 2020-01-26 ENCOUNTER — Other Ambulatory Visit: Payer: Self-pay

## 2020-01-26 ENCOUNTER — Encounter: Payer: Self-pay | Admitting: Physical Medicine and Rehabilitation

## 2020-01-26 DIAGNOSIS — M7062 Trochanteric bursitis, left hip: Secondary | ICD-10-CM

## 2020-01-26 NOTE — Progress Notes (Signed)
Pain across lower back, into left buttock, and into lateral left thigh. Worse with sitting. **Contrast allergy

## 2020-01-27 NOTE — Progress Notes (Signed)
Patient with history of lumbar fusion from L2-L5 as well as left total hip arthroplasty.  From what I can determine Vanessa Core, PA-C ordered left greater trochanteric injection which was scheduled with Dr. Eunice Blase and completed on Jan 07, 2020.  Patient was also seen by Dr. Basil Dess on Jan 05, 2020 and he put in a request for left greater trochanteric injection with fluoroscopic guidance which was scheduled for today in our office.  Patient did already have greater trochanteric injection by Dr. Junius Roads and per her report for the first couple of days it was increasing severe horrible pain and she did get relief for about a week and a half and it was better than it had been but not completely gone and then now it is back to significantly painful.  She does have follow-up with Dr. Louanne Skye.  This was a no charge visit today since she had already had the injection.

## 2020-01-29 ENCOUNTER — Ambulatory Visit
Admission: RE | Admit: 2020-01-29 | Discharge: 2020-01-29 | Disposition: A | Discharge status: Home / Self Care | Payer: 59 | Source: Ambulatory Visit | Attending: Specialist | Admitting: Specialist

## 2020-01-29 DIAGNOSIS — Z4889 Encounter for other specified surgical aftercare: Secondary | ICD-10-CM

## 2020-02-05 ENCOUNTER — Ambulatory Visit: Payer: Self-pay

## 2020-02-05 ENCOUNTER — Ambulatory Visit: Payer: 59 | Admitting: Specialist

## 2020-02-05 ENCOUNTER — Other Ambulatory Visit: Payer: Self-pay

## 2020-02-05 ENCOUNTER — Encounter: Payer: Self-pay | Admitting: Specialist

## 2020-02-05 VITALS — BP 126/82 | HR 70 | Ht 65.0 in | Wt 260.0 lb

## 2020-02-05 DIAGNOSIS — M5136 Other intervertebral disc degeneration, lumbar region: Secondary | ICD-10-CM | POA: Diagnosis not present

## 2020-02-05 DIAGNOSIS — M47816 Spondylosis without myelopathy or radiculopathy, lumbar region: Secondary | ICD-10-CM

## 2020-02-05 DIAGNOSIS — Z981 Arthrodesis status: Secondary | ICD-10-CM | POA: Diagnosis not present

## 2020-02-05 MED ORDER — HYDROCODONE-ACETAMINOPHEN 5-325 MG PO TABS: 1.0000 | ORAL_TABLET | Freq: Three times a day (TID) | ORAL | 0 refills | Status: DC | PRN

## 2020-02-05 NOTE — Patient Instructions (Signed)
Avoid bending, stooping and avoid lifting weights greater than 10 lbs. Avoid prolong standing and walking. Avoid frequent bending and stooping  No lifting greater than 10 lbs. May use ice or moist heat for pain. Weight loss is of benefit. Handicap license is approved. Dr. Romona Curls secretary/Assistant will call to arrange for facet injections bilateral L5-S1 to assess for spondylosis pain below the Fusion and consider RFA if pain is relieved.  Hydrocodone for pain, gastric sleeve surgery precludes the use of NSAIDs or steroids orally.

## 2020-02-05 NOTE — Progress Notes (Signed)
Office Visit Note   Patient: Vanessa Medina           Date of Birth: 1971-07-08           MRN: [id number] Visit Date: 02/05/2020              Requested by: Maryland Pink, MD [address] [place],  [place] [id number] PCP: Maryland Pink, MD   Assessment & Plan: Visit Diagnoses:  1. S/P lumbar spinal fusion   2. Spondylosis without myelopathy or radiculopathy, lumbar region   3. Lumbar degenerative disc disease     Plan: Avoid bending, stooping and avoid lifting weights greater than 10 lbs. Avoid prolong standing and walking. Avoid frequent bending and stooping  No lifting greater than 10 lbs. May use ice or moist heat for pain. Weight loss is of benefit. Handicap license is approved. Dr. Romona Curls secretary/Assistant will call to arrange for facet injections bilateral L5-S1 to assess for spondylosis pain below the Fusion and consider RFA if pain is relieved.  Hydrocodone for pain, gastric sleeve surgery precludes the use of NSAIDs or steroids orally.   Follow-Up Instructions: Return in about 4 weeks (around 03/04/2020).   Orders:  Orders Placed This Encounter  Procedures  . XR Lumbar Spine 2-3 Views  . Ambulatory referral to Physical Medicine Rehab   Meds ordered this encounter  Medications  . HYDROcodone-acetaminophen (NORCO/VICODIN) 5-325 MG tablet    Sig: Take 1 tablet by mouth every 8 (eight) hours as needed for moderate pain.    Dispense:  30 tablet    Refill:  0      Procedures: No procedures performed   Clinical Data: No additional findings.   Subjective: Chief Complaint  Patient presents with  . Lower Back - Follow-up    HPI  Review of Systems   Objective: Vital Signs: BP 126/82 (BP Location: Left Arm, Patient Position: Sitting)   Pulse 70   Ht 5\' 5"  (1.651 m)   Wt 260 lb (117.9 kg)   BMI 43.27 kg/m   Physical Exam  Ortho Exam  Specialty Comments:  No specialty comments available.  Imaging: XR Lumbar Spine  2-3 Views  Result Date: 02/05/2020 AP and lateral lumbar spine demonstrates hardware and interbody cages L2-3, L3-4 and L4-5 no acute findings no abnormal movement. No radiographic loosency though radiologist notes mild L5 bilateral screw lucency.     PMFS History: Patient Active Problem List   Diagnosis Date Noted  . DDD (degenerative disc disease), lumbar 09/02/2019    Priority: High    Class: Chronic  . Degenerative disc disease, lumbar 09/02/2019  . Chest tightness 09/23/2018  . Bradycardia 09/23/2018  . Labile blood pressure 03/08/2018  . Chronic low back pain 09/03/2017  . History of total hip replacement, left 01/26/2016  . EDEMA 04/28/2008  . LEG PAIN, BILATERAL 12/16/2007  . CERVICAL CANCER 07/02/2007  . MORBID OBESITY 07/02/2007  . DEPRESSION 07/02/2007  . COMMON MIGRAINE 07/02/2007  . ALLERGIC RHINITIS 07/02/2007  . ASTHMA 07/02/2007  . GERD 07/02/2007  . ELEVATED BLOOD PRESSURE WITHOUT DIAGNOSIS OF HYPERTENSION 07/02/2007   Past Medical History:  Diagnosis Date  . Anemia   . Cervical cancer ([place])   . Family history of adverse reaction to anesthesia     " my mother takes a long time time wake up"  . GERD (gastroesophageal reflux disease)   . Headache    migraine  . History of shingles   . Hypertension   .  Migraine   . Multiple allergies   . Obesity   . Osteoarthritis    left hip  . PONV (postoperative nausea and vomiting)   . Sleep apnea    does not wear CPAP  . Wears glasses     Family History  Problem Relation Age of Onset  . Renal Disease Mother   . Hypertension Mother   . Sudden Cardiac Death Mother   . Heart failure Mother   . Valvular heart disease Mother   . Heart disease Mother   . Stroke Brother   . Heart attack Brother 75  . Diabetes Other   . Breast cancer Cousin        paternal side    Past Surgical History:  Procedure Laterality Date  . ABDOMINAL HYSTERECTOMY    . APPENDECTOMY    . BILIOPANCREATIC DIVERSION     with duodenal  switch laparoscopic   . CHOLECYSTECTOMY    . DIAGNOSTIC LAPAROSCOPY     LOA  . ESOPHAGOGASTRODUODENOSCOPY (EGD) WITH PROPOFOL N/A 07/25/2017   Procedure: ESOPHAGOGASTRODUODENOSCOPY (EGD) WITH PROPOFOL;  Surgeon: Manya Silvas, MD;  [facility] END[facility];  Service: Endoscopy;  Laterality: N/A;  . KNEE ARTHROSCOPY    . LAPAROSCOPIC GASTRIC SLEEVE RESECTION WITH HIATAL HERNIA REPAIR    . TONSILLECTOMY    . TOTAL HIP ARTHROPLASTY Left 01/26/2016   Procedure: LEFT TOTAL HIP ARTHROPLASTY ANTERIOR APPROACH;  Surgeon: Leandrew Koyanagi, MD;  Location: [place];  Service: Orthopedics;  Laterality: Left;   Social History   Occupational History  . Not on file  Tobacco Use  . Smoking status: Never Smoker  . Smokeless tobacco: Never Used  Substance and Sexual Activity  . Alcohol use: Yes    Comment: occasional wine  . Drug use: No  . Sexual activity: Not on file

## 2020-02-09 ENCOUNTER — Telehealth: Payer: Self-pay | Admitting: Physical Medicine and Rehabilitation

## 2020-02-09 NOTE — Telephone Encounter (Signed)
Patient returned call asked for a call back to schedule an appointment. The number to contact patient is [phone]

## 2020-02-10 ENCOUNTER — Telehealth: Payer: Self-pay | Admitting: Physical Medicine and Rehabilitation

## 2020-02-10 NOTE — Telephone Encounter (Signed)
Patient returned your call in regards to scheduling an appointment.  CB#[phone].  Thank you.

## 2020-02-10 NOTE — Telephone Encounter (Signed)
Pt schedule for 03/04/20 with driver.

## 2020-02-12 ENCOUNTER — Encounter: Payer: Self-pay | Admitting: Specialist

## 2020-02-16 NOTE — Progress Notes (Signed)
Date:  02/17/2020   ID:  Vanessa Medina, DOB 1970-10-05, MRN [id number]  Patient Location:  [address] [place] [address]   Provider location:   [facility] [place], [place] office  PCP:  Maryland Pink, MD  Cardiologist:  Arvid Right [facility]   Chief Complaint  Patient presents with  . Other    12 month follow up. Meds reviewed verbally with patient.     History of Present Illness:    Vanessa Medina is a 49 y.o. female  past medical history of Nonsmoker No diabetes With history of hypertension, Morbid obesity History of gastric sleeve 3 years ago Who presents for follow-up of her labile blood pressure, hypotension and orthostasis  Back pain, Dec 2020, back surgery Limited work, 4 hr a day  Weight 247 to 271 pounds Going to change habits going to start Protein shakes  Takes midodrine 10 mg TID Periodic dizziness, vertigo Labile at times  EKG personally reviewed by myself on todays visit Shows NSR rate 79 bpm  Other hx reviewed major surgery in December 2019 Told she had significant adhesions Several trips back to the hospital for various reasons including dehydration During her hospital visit September 05, 2018 and September 18, 2018 she reported heart rate was running low, medications were held  Since medications held heart rate typically running 50 up to 60, blood pressure stable but very occasionally with low heart rate.  One episode down to 40 in the setting of upper epigastric pain  Cardiac studies  Echocardiogram August 14, 2018 - Left ventricle: The cavity size was normal. Wall thickness was increased in a pattern of mild LVH. Systolic function was normal. The estimated ejection fraction was in the range of 55% to 60%. Wall motion was normal; there were no regional wall motion abnormalities. Left ventricular diastolic function parameters were normal. - Right ventricle: The cavity size was normal. Wall thickness  was normal. Systolic function was normal.   Prior CV studies:   The following studies were reviewed today:   Past Medical History:  Diagnosis Date  . Anemia   . Cervical cancer ([place])   . Family history of adverse reaction to anesthesia     " my mother takes a long time time wake up"  . GERD (gastroesophageal reflux disease)   . Headache    migraine  . History of shingles   . Hypertension   . Migraine   . Multiple allergies   . Obesity   . Osteoarthritis    left hip  . PONV (postoperative nausea and vomiting)   . Sleep apnea    does not wear CPAP  . Wears glasses    Past Surgical History:  Procedure Laterality Date  . ABDOMINAL HYSTERECTOMY    . APPENDECTOMY    . BILIOPANCREATIC DIVERSION     with duodenal switch laparoscopic   . CHOLECYSTECTOMY    . DIAGNOSTIC LAPAROSCOPY     LOA  . ESOPHAGOGASTRODUODENOSCOPY (EGD) WITH PROPOFOL N/A 07/25/2017   Procedure: ESOPHAGOGASTRODUODENOSCOPY (EGD) WITH PROPOFOL;  Surgeon: Vanessa Silvas, MD;  Location: [facility] ENDOSCOPY;  Service: Endoscopy;  Laterality: N/A;  . KNEE ARTHROSCOPY    . LAPAROSCOPIC GASTRIC SLEEVE RESECTION WITH HIATAL HERNIA REPAIR    . TONSILLECTOMY    . TOTAL HIP ARTHROPLASTY Left 01/26/2016   Procedure: LEFT TOTAL HIP ARTHROPLASTY ANTERIOR APPROACH;  Surgeon: Leandrew Koyanagi, MD;  Location: [place];  Service: Orthopedics;  Laterality: Left;  Current Meds  Medication Sig  . Calcium Carbonate-Vitamin D (CALTRATE 600+D PO) Take 1 tablet by mouth daily.   . cyanocobalamin (,VITAMIN B-12,) 1000 MCG/ML injection Inject 1,000 mcg into the muscle every 30 (thirty) days.  Marland Kitchen gabapentin (NEURONTIN) 300 MG capsule Take 1 capsule (300 mg total) by mouth 2 (two) times daily.  Marland Kitchen HYDROcodone-acetaminophen (NORCO/VICODIN) 5-325 MG tablet Take 1 tablet by mouth every 8 (eight) hours as needed for moderate pain.  . methocarbamol (ROBAXIN) 500 MG tablet TAKE 1 TABLET(500 MG) BY MOUTH EVERY 8 HOURS AS NEEDED FOR MUSCLE SPASMS   . midodrine (PROAMATINE) 10 MG tablet Take 1 tablet (10 mg total) by mouth 3 (three) times daily.  . Multiple Vitamins-Minerals (MULTIVITAMIN WITH MINERALS) tablet Take 1 tablet by mouth daily.  . pramipexole (MIRAPEX) 0.125 MG tablet Take 0.5 mg by mouth at bedtime. Pt takes 4 tablets at bedtime  . topiramate (TOPAMAX) 25 MG tablet Take 25 mg by mouth 2 (two) times daily.   Current Facility-Administered Medications for the 02/17/20 encounter (Office Visit) with Minna Merritts, MD  Medication  . methylPREDNISolone acetate (DEPO-MEDROL) injection 80 mg     Allergies:   Shellfish allergy, Meperidine hcl, Morphine, Bee pollen, Ivp dye [iodinated diagnostic agents], Codeine, Oxycodone-acetaminophen, Pentazocine lactate, and Propoxyphene n-acetaminophen   Social History   Tobacco Use  . Smoking status: Never Smoker  . Smokeless tobacco: Never Used  Vaping Use  . Vaping Use: Never used  Substance Use Topics  . Alcohol use: Yes    Comment: occasional wine  . Drug use: No     Family Hx: The patient's family history includes Breast cancer in her cousin; Diabetes in an other family member; Heart attack (age of onset: 30) in her brother; Heart disease in her mother; Heart failure in her mother; Hypertension in her mother; Renal Disease in her mother; Stroke in her brother; Sudden Cardiac Death in her mother; Valvular heart disease in her mother.  ROS:   Please see the history of present illness.    Review of Systems  Constitutional: Negative.   HENT: Negative.   Respiratory: Negative.   Cardiovascular: Negative.   Gastrointestinal: Negative.   Musculoskeletal: Negative.   Neurological: Positive for dizziness.  Psychiatric/Behavioral: Negative.   All other systems reviewed and are negative.   Labs/Other Tests and Data Reviewed:    Recent Labs: 08/22/2019: ALT 24 09/06/2019: BUN 10; Creatinine, Ser 1.05; Hemoglobin 9.0; Platelets 249; Potassium 3.6; Sodium 139   Recent Lipid  Panel No results found for: CHOL, TRIG, HDL, CHOLHDL, LDLCALC, LDLDIRECT  Wt Readings from Last 3 Encounters:  02/17/20 271 lb (122.9 kg)  02/05/20 260 lb (117.9 kg)  01/05/20 260 lb (117.9 kg)     Exam:    Vital Signs: Vital signs may also be detailed in the HPI BP (!) 144/76 (BP Location: Left Arm, Patient Position: Sitting, Cuff Size: Normal)   Pulse 79   Ht 5\' 5"  (1.651 m)   Wt 271 lb (122.9 kg)   SpO2 98%   BMI 45.10 kg/m   Constitutional:  oriented to person, place, and time. No distress.  HENT:  Head: Grossly normal Eyes:  no discharge. No scleral icterus.  Neck: No JVD, no carotid bruits  Cardiovascular: Regular rate and rhythm, no murmurs appreciated Pulmonary/Chest: Clear to auscultation bilaterally, no wheezes or rails Abdominal: Soft.  no distension.  no tenderness.  Musculoskeletal: Normal range of motion Neurological:  normal muscle tone. Coordination normal. No atrophy Skin: Skin warm and  dry Psychiatric: normal affect, pleasant   ASSESSMENT & PLAN:    Labile blood pressure -  Midodrine 10 mg tid Pressure at home 90s to 110 standing  MORBID OBESITY  We have encouraged continued exercise, careful diet management in an effort to lose weight.  H/o gastric sleeve Hard to hydrate  Vertigo Rare epsiodes   Total encounter time more than 25 minutes  Greater than 50% was spent in counseling and coordination of care with the patient   Disposition: Follow-up in 6 months   Signed, Ida Rogue, MD  02/17/2020 2:45 PM    [facility] [id number], [place], [place] [address]

## 2020-02-17 ENCOUNTER — Other Ambulatory Visit: Payer: Self-pay

## 2020-02-17 ENCOUNTER — Ambulatory Visit: Payer: 59 | Admitting: Cardiovascular Disease

## 2020-02-17 ENCOUNTER — Encounter: Payer: Self-pay | Admitting: Cardiovascular Disease

## 2020-02-17 VITALS — BP 144/76 | HR 79 | Ht 65.0 in | Wt 271.0 lb

## 2020-02-17 DIAGNOSIS — R002 Palpitations: Secondary | ICD-10-CM

## 2020-02-17 DIAGNOSIS — I959 Hypotension, unspecified: Secondary | ICD-10-CM | POA: Diagnosis not present

## 2020-02-17 DIAGNOSIS — I951 Orthostatic hypotension: Secondary | ICD-10-CM

## 2020-02-17 DIAGNOSIS — R600 Localized edema: Secondary | ICD-10-CM

## 2020-02-17 MED ORDER — MIDODRINE HCL 10 MG PO TABS: 10.0000 mg | ORAL_TABLET | Freq: Three times a day (TID) | ORAL | 3 refills | Status: DC

## 2020-02-17 NOTE — Patient Instructions (Signed)

## 2020-03-04 ENCOUNTER — Ambulatory Visit: Payer: 59 | Admitting: Physical Medicine and Rehabilitation

## 2020-03-04 ENCOUNTER — Other Ambulatory Visit: Payer: Self-pay

## 2020-03-04 ENCOUNTER — Ambulatory Visit: Payer: Self-pay

## 2020-03-04 ENCOUNTER — Encounter: Payer: Self-pay | Admitting: Physical Medicine and Rehabilitation

## 2020-03-04 VITALS — BP 129/82 | HR 71

## 2020-03-04 DIAGNOSIS — M47816 Spondylosis without myelopathy or radiculopathy, lumbar region: Secondary | ICD-10-CM

## 2020-03-04 MED ORDER — BUPIVACAINE HCL 0.5 % IJ SOLN
3.0000 mL | Freq: Once | INTRAMUSCULAR | Status: AC
  Administered 2020-03-04: 3 mL

## 2020-03-04 NOTE — Progress Notes (Signed)
Pt states pain in the left side of the lower back that radiates into the back and side of the left leg all the way down. Pt states pain started post surgery 08/2019. Sitting and standing makes pain worse. Nothing gives her any relief. Pt also states PT sessions that did not help.   .Numeric Pain Rating Scale and Functional Assessment Average Pain 4   In the last MONTH (on 0-10 scale) has pain interfered with the following?  1. General activity like being  able to carry out your everyday physical activities such as walking, climbing stairs, carrying groceries, or moving a chair?  Rating(10)   +Driver, -BT, +Dye Allergies(IVP dye).

## 2020-03-05 NOTE — Progress Notes (Signed)
Vanessa Medina - 49 y.o. female MRN [id number]  Date of birth: [birth year]  Office Visit Note: Visit Date: 03/04/2020 PCP: Maryland Pink, MD Referred by: Maryland Pink, MD  Subjective: Chief Complaint  Patient presents with  . Lower Back - Pain  . Left Leg - Pain   HPI:  Vanessa Medina is a 49 y.o. female who comes in today at the request of Dr. Basil Dess for planned Bilateral L5-S1 lumbar facet/medial branch block with fluoroscopic guidance.  The patient has failed conservative care including home exercise, medications, time and activity modification.  This injection will be diagnostic and hopefully therapeutic.  Please see requesting physician notes for further details and justification.   CT scan reviewed below.  I have seen the patient in the past but since I have seen her she has had multilevel lumbar instrumented fusion.  She is not fused at the L5-S1 level.  She is having a lot of pain across the lower back particularly with sitting and feels better with standing.  She does have pain with facet loading but has atypical lumbar pain with sitting which is not usually seen with facet joints.  This test would be diagnostic using Marcaine only without steroid.  She is allergic to contrast dye with swelling history.  She rates her pain as a 4 out of 10 but she seems very uncomfortable.  ROS Otherwise per HPI.  Assessment & Plan: Visit Diagnoses:  1. Spondylosis without myelopathy or radiculopathy, lumbar region     Plan: No additional findings.   Meds & Orders:  Meds ordered this encounter  Medications  . bupivacaine (MARCAINE) 0.5 % (with pres) injection 3 mL    Orders Placed This Encounter  Procedures  . Facet Injection  . XR C-ARM NO REPORT    Follow-up: Return for Review Pain Diary.   Procedures: No procedures performed  Lumbar Diagnostic Facet Joint Nerve Block with Fluoroscopic Guidance   Patient: Vanessa Medina      Date of Birth: 03/04/1971 MRN:  [id number] PCP: Maryland Pink, MD      Visit Date: 03/04/2020   Universal Protocol:    Date/Time: 07/02/215:56 AM  Consent Given By: the patient  Position: PRONE  Additional Comments: Vital signs were monitored before and after the procedure. Patient was prepped and draped in the usual sterile fashion. The correct patient, procedure, and site was verified.   Injection Procedure Details:  Procedure Site One Meds Administered:  Meds ordered this encounter  Medications  . bupivacaine (MARCAINE) 0.5 % (with pres) injection 3 mL     Laterality: Bilateral  Location/Site: Bilateral L4 medial branches and L5 dorsal rami. L5-S1  Needle size: 22 ga.  Needle type:spinal  Needle Placement: Oblique pedical  Findings:   -Comments: There was excellent flow of contrast along the articular pillars without intravascular flow.  Procedure Details: The fluoroscope beam is vertically oriented in AP and then obliqued 15 to 20 degrees to the ipsilateral side of the desired nerve to achieve the "Scotty dog" appearance.  The skin over the target area of the junction of the superior articulating process and the transverse process (sacral ala if blocking the L5 dorsal rami) was locally anesthetized with a 1 ml volume of 1% Lidocaine without Epinephrine.  The spinal needle was inserted and advanced in a trajectory view down to the target.   After contact with periosteum and negative aspirate for blood and CSF, correct placement achieved with biplanar fluoroscopic imaging.  A spot radiograph  was obtained of this image.    Next, a 0.5 ml. volume of the injectate described above was injected. The needle was then redirected to the other facet joint nerves mentioned above if needed.  Prior to the procedure, the patient was given a Pain Diary which was completed for baseline measurements.  After the procedure, the patient rated their pain every 30 minutes and will continue rating at this frequency for a  total of 5 hours.  The patient has been asked to complete the Diary and return to [name] by mail, fax or hand delivered as soon as possible.   Additional Comments:  The patient tolerated the procedure well Dressing: 2 x 2 sterile gauze and Band-Aid    Post-procedure details: Patient was observed during the procedure. Post-procedure instructions were reviewed.  Patient left the clinic in stable condition.    Clinical History: CLINICAL DATA:  Left SI joint pain.  History of L2-5 fusion.  EXAM: CT LUMBAR SPINE WITHOUT CONTRAST  TECHNIQUE: Multidetector CT imaging of the lumbar spine was performed without intravenous contrast administration. Multiplanar CT image reconstructions were also generated.  COMPARISON:  None.  FINDINGS: Segmentation: 5 lumbar type vertebrae.  Alignment: Normal.  Vertebrae: L2-5 fusion. Anterior arthrodesis is not yet complete. There is mild lucency along both L5 screws. No fracture.  Paraspinal and other soft tissues: Negative.  Disc levels:  T12-L1: Normal.  L1-L2: Normal disc space and facet joints. There is no spinal canal stenosis. No neural foraminal stenosis.  L2-L3: PLIF with left facetectomy and foraminotomy. There is no spinal canal stenosis. No neural foraminal stenosis.  L3-L4: PLIF with left facetectomy and foraminotomy. There is no spinal canal stenosis. No neural foraminal stenosis.  L4-L5: PLIF with left facetectomy and foraminotomy. There is no spinal canal stenosis. No neural foraminal stenosis.  L5-S1: Normal disc space and facet joints. There is no spinal canal stenosis. No neural foraminal stenosis.  Visualized sacrum: Normal.  IMPRESSION: 1. L2-5 PLIF with left facetectomy and foraminotomy. Anterior arthrodesis is not complete. 2. Mild lucency along both L5 transpedicular screws. 3. No spinal canal stenosis or neural foraminal stenosis.   Electronically Signed   By: Ulyses Jarred M.D.   On:  01/31/2020 02:02     Objective:  VS:  HT:    WT:   BMI:     BP:129/82  HR:71bpm  TEMP: ( )  RESP:  Physical Exam Constitutional:      General: She is not in acute distress.    Appearance: Normal appearance. She is obese. She is not ill-appearing.  HENT:     Head: Normocephalic and atraumatic.     Right Ear: External ear normal.     Left Ear: External ear normal.  Eyes:     Extraocular Movements: Extraocular movements intact.  Cardiovascular:     Rate and Rhythm: Normal rate.     Pulses: Normal pulses.  Musculoskeletal:     Right lower leg: No edema.     Left lower leg: No edema.     Comments: Patient has good distal strength with no pain over the greater trochanters.  No clonus or focal weakness.  Pain with extension and facet loading but also pain with sitting.  Skin:    Findings: No erythema, lesion or rash.  Neurological:     General: No focal deficit present.     Mental Status: She is alert and oriented to person, place, and time.     Sensory: No sensory deficit.  Motor: No weakness or abnormal muscle tone.     Coordination: Coordination normal.  Psychiatric:        Mood and Affect: Mood normal.        Behavior: Behavior normal.      Imaging: XR C-ARM NO REPORT  Result Date: 03/04/2020 Please see Notes tab for imaging impression.

## 2020-03-05 NOTE — Procedures (Signed)
Lumbar Diagnostic Facet Joint Nerve Block with Fluoroscopic Guidance   Patient: Vanessa Medina      Date of Birth: 1971/06/09 MRN: [id number] PCP: Maryland Pink, MD      Visit Date: 03/04/2020   Universal Protocol:    Date/Time: 07/02/215:56 AM  Consent Given By: the patient  Position: PRONE  Additional Comments: Vital signs were monitored before and after the procedure. Patient was prepped and draped in the usual sterile fashion. The correct patient, procedure, and site was verified.   Injection Procedure Details:  Procedure Site One Meds Administered:  Meds ordered this encounter  Medications  . bupivacaine (MARCAINE) 0.5 % (with pres) injection 3 mL     Laterality: Bilateral  Location/Site: Bilateral L4 medial branches and L5 dorsal rami. L5-S1  Needle size: 22 ga.  Needle type:spinal  Needle Placement: Oblique pedical  Findings:   -Comments: There was excellent flow of contrast along the articular pillars without intravascular flow.  Procedure Details: The fluoroscope beam is vertically oriented in AP and then obliqued 15 to 20 degrees to the ipsilateral side of the desired nerve to achieve the "Scotty dog" appearance.  The skin over the target area of the junction of the superior articulating process and the transverse process (sacral ala if blocking the L5 dorsal rami) was locally anesthetized with a 1 ml volume of 1% Lidocaine without Epinephrine.  The spinal needle was inserted and advanced in a trajectory view down to the target.   After contact with periosteum and negative aspirate for blood and CSF, correct placement achieved with biplanar fluoroscopic imaging.  A spot radiograph was obtained of this image.    Next, a 0.5 ml. volume of the injectate described above was injected. The needle was then redirected to the other facet joint nerves mentioned above if needed.  Prior to the procedure, the patient was given a Pain Diary which was completed for  baseline measurements.  After the procedure, the patient rated their pain every 30 minutes and will continue rating at this frequency for a total of 5 hours.  The patient has been asked to complete the Diary and return to [name] by mail, fax or hand delivered as soon as possible.   Additional Comments:  The patient tolerated the procedure well Dressing: 2 x 2 sterile gauze and Band-Aid    Post-procedure details: Patient was observed during the procedure. Post-procedure instructions were reviewed.  Patient left the clinic in stable condition.

## 2020-03-11 ENCOUNTER — Encounter: Payer: Self-pay | Admitting: Specialist

## 2020-03-11 ENCOUNTER — Other Ambulatory Visit: Payer: Self-pay | Admitting: Specialist

## 2020-03-11 ENCOUNTER — Other Ambulatory Visit: Payer: Self-pay

## 2020-03-11 ENCOUNTER — Ambulatory Visit: Payer: 59 | Admitting: Specialist

## 2020-03-11 VITALS — BP 119/81 | HR 76 | Ht 65.0 in | Wt 271.0 lb

## 2020-03-11 DIAGNOSIS — M47816 Spondylosis without myelopathy or radiculopathy, lumbar region: Secondary | ICD-10-CM

## 2020-03-11 DIAGNOSIS — Z981 Arthrodesis status: Secondary | ICD-10-CM

## 2020-03-11 DIAGNOSIS — Z4889 Encounter for other specified surgical aftercare: Secondary | ICD-10-CM

## 2020-03-11 DIAGNOSIS — M533 Sacrococcygeal disorders, not elsewhere classified: Secondary | ICD-10-CM | POA: Diagnosis not present

## 2020-03-11 DIAGNOSIS — M5136 Other intervertebral disc degeneration, lumbar region: Secondary | ICD-10-CM | POA: Diagnosis not present

## 2020-03-11 DIAGNOSIS — G8929 Other chronic pain: Secondary | ICD-10-CM

## 2020-03-11 MED ORDER — ALPRAZOLAM 0.5 MG PO TABS: ORAL_TABLET | ORAL | 0 refills | Status: DC

## 2020-03-11 MED ORDER — CELECOXIB 200 MG PO CAPS: 200.0000 mg | ORAL_CAPSULE | Freq: Two times a day (BID) | ORAL | 3 refills | Status: DC

## 2020-03-11 MED ORDER — HYDROCODONE-ACETAMINOPHEN 5-325 MG PO TABS: 1.0000 | ORAL_TABLET | Freq: Four times a day (QID) | ORAL | 0 refills | Status: DC | PRN

## 2020-03-11 NOTE — Patient Instructions (Signed)
Plan: Avoid frequent bending and stooping  No lifting greater than 10 lbs. May use ice or moist heat for pain. Weight loss is of benefit. Best medication for lumbar disc disease is arthritis medications like motrin, celebrex and naprosyn. Exercise is important to improve your indurance and does allow people to function better inspite of back pain.  Start celebrex for arthritis pain MRI of the lumbar spine ordered with and without contrast to assess the discs above and below fusion for abnormalities.   Hemp CBD capsules, amazon.com 5,000-7,000 mg per bottle, 60 capsules per bottle, take one capsule twice a day. Cane in the right hand to use with left hip weight bearing. Follow-Up Instructions: No follow-ups on file.

## 2020-03-11 NOTE — Progress Notes (Signed)
Office Visit Note   Patient: Vanessa Medina           Date of Birth: 10-10-1970           MRN: [id number] Visit Date: 03/11/2020              Requested by: Maryland Pink, MD [address] [place],  [place] [address] PCP: Maryland Pink, MD   Assessment & Plan: Visit Diagnoses:  1. Chronic left SI joint pain   2. Encounter for other specified surgical aftercare   3. S/P lumbar spinal fusion   4. Lumbar degenerative disc disease   5. Spondylosis without myelopathy or radiculopathy, lumbar region     Plan: Avoid frequent bending and stooping  No lifting greater than 10 lbs. May use ice or moist heat for pain. Weight loss is of benefit. Best medication for lumbar disc disease is arthritis medications like motrin, celebrex and naprosyn. Exercise is important to improve your indurance and does allow people to function better inspite of back pain.  Start celebrex for arthritis pain MRI of the lumbar spine ordered with and without contrast to assess the discs above and below fusion for abnormalities.   Hemp CBD capsules, amazon.com 5,000-7,000 mg per bottle, 60 capsules per bottle, take one capsule twice a day. Cane in the right hand to use with left hip weight bearing. Follow-Up Instructions: No follow-ups on file.   Follow-Up Instructions: Return in about 2 weeks (around 03/25/2020).   Orders:  Orders Placed This Encounter  Procedures  . MR Lumbar Spine W Wo Contrast   Meds ordered this encounter  Medications  . ALPRAZolam (XANAX) 0.5 MG tablet    Sig: Take one xanax at the MRI facility after paperwork completed, may repeat after 1/2 hour if needed.    Dispense:  2 tablet    Refill:  0  . HYDROcodone-acetaminophen (NORCO/VICODIN) 5-325 MG tablet    Sig: Take 1 tablet by mouth every 6 (six) hours as needed for moderate pain.    Dispense:  30 tablet    Refill:  0  . celecoxib (CELEBREX) 200 MG capsule    Sig: Take 1 capsule (200 mg total) by mouth 2  (two) times daily.    Dispense:  60 capsule    Refill:  3      Procedures: No procedures performed   Clinical Data: No additional findings.   Subjective: Chief Complaint  Patient presents with  . Lower Back - Follow-up    She had Bilat L4 Medial Branch and L5 dorsal rami blocks, states that she did not notice any difference     49 year old female now nearly 7 months post lumbar fusion 3 levels for DDD with hypolordosis of the mid lumbar segments and had  Fusion of L2-3, L3-4 and L4-5.    Review of Systems  Constitutional: Negative.   HENT: Negative.   Eyes: Negative.   Respiratory: Negative.   Cardiovascular: Negative.   Gastrointestinal: Negative.   Endocrine: Negative.   Genitourinary: Negative.   Musculoskeletal: Negative.   Skin: Negative.   Allergic/Immunologic: Negative.   Neurological: Negative.   Hematological: Negative.   Psychiatric/Behavioral: Negative.      Objective: Vital Signs: BP 119/81 (BP Location: Left Arm, Patient Position: Sitting)   Pulse 76   Ht 5\' 5"  (1.651 m)   Wt 271 lb (122.9 kg)   BMI 45.10 kg/m   Physical Exam Constitutional:      Appearance: She is  well-developed.  HENT:     Head: Normocephalic and atraumatic.  Eyes:     Pupils: Pupils are equal, round, and reactive to light.  Pulmonary:     Effort: Pulmonary effort is normal.     Breath sounds: Normal breath sounds.  Abdominal:     General: Bowel sounds are normal.     Palpations: Abdomen is soft.  Musculoskeletal:     Cervical back: Normal range of motion and neck supple.     Lumbar back: Negative right straight leg raise test and negative left straight leg raise test.  Skin:    General: Skin is warm and dry.  Neurological:     Mental Status: She is alert and oriented to person, place, and time.  Psychiatric:        Behavior: Behavior normal.        Thought Content: Thought content normal.        Judgment: Judgment normal.     Back Exam   Tenderness  The  patient is experiencing tenderness in the lumbar.  Range of Motion  Extension: abnormal  Flexion: abnormal  Lateral bend right: normal  Lateral bend left: normal  Rotation right: normal  Rotation left: normal   Muscle Strength  Right Quadriceps:  5/5  Left Quadriceps:  5/5  Right Hamstrings:  5/5  Left Hamstrings:  5/5   Tests  Straight leg raise right: negative Straight leg raise left: negative  Reflexes  Patellar: 0/4 Achilles: 0/4 Babinski's sign: normal   Other  Toe walk: normal Heel walk: normal      Specialty Comments:  No specialty comments available.  Imaging: No results found.   PMFS History: Patient Active Problem List   Diagnosis Date Noted  . DDD (degenerative disc disease), lumbar 09/02/2019    Priority: High    Class: Chronic  . Degenerative disc disease, lumbar 09/02/2019  . Chest tightness 09/23/2018  . Bradycardia 09/23/2018  . Labile blood pressure 03/08/2018  . Chronic low back pain 09/03/2017  . History of total hip replacement, left 01/26/2016  . EDEMA 04/28/2008  . LEG PAIN, BILATERAL 12/16/2007  . CERVICAL CANCER 07/02/2007  . MORBID OBESITY 07/02/2007  . DEPRESSION 07/02/2007  . COMMON MIGRAINE 07/02/2007  . ALLERGIC RHINITIS 07/02/2007  . ASTHMA 07/02/2007  . GERD 07/02/2007  . ELEVATED BLOOD PRESSURE WITHOUT DIAGNOSIS OF HYPERTENSION 07/02/2007   Past Medical History:  Diagnosis Date  . Anemia   . Cervical cancer ([place])   . Family history of adverse reaction to anesthesia     " my mother takes a long time time wake up"  . GERD (gastroesophageal reflux disease)   . Headache    migraine  . History of shingles   . Hypertension   . Migraine   . Multiple allergies   . Obesity   . Osteoarthritis    left hip  . PONV (postoperative nausea and vomiting)   . Sleep apnea    does not wear CPAP  . Wears glasses     Family History  Problem Relation Age of Onset  . Renal Disease Mother   . Hypertension Mother   . Sudden  Cardiac Death Mother   . Heart failure Mother   . Valvular heart disease Mother   . Heart disease Mother   . Stroke Brother   . Heart attack Brother 96  . Diabetes Other   . Breast cancer Cousin        paternal side    Past Surgical History:  Procedure Laterality Date  . ABDOMINAL HYSTERECTOMY    . APPENDECTOMY    . BILIOPANCREATIC DIVERSION     with duodenal switch laparoscopic   . CHOLECYSTECTOMY    . DIAGNOSTIC LAPAROSCOPY     LOA  . ESOPHAGOGASTRODUODENOSCOPY (EGD) WITH PROPOFOL N/A 07/25/2017   Procedure: ESOPHAGOGASTRODUODENOSCOPY (EGD) WITH PROPOFOL;  Surgeon: Manya Silvas, MD;  Location: [facility] ENDOSCOPY;  Service: Endoscopy;  Laterality: N/A;  . KNEE ARTHROSCOPY    . LAPAROSCOPIC GASTRIC SLEEVE RESECTION WITH HIATAL HERNIA REPAIR    . TONSILLECTOMY    . TOTAL HIP ARTHROPLASTY Left 01/26/2016   Procedure: LEFT TOTAL HIP ARTHROPLASTY ANTERIOR APPROACH;  Surgeon: Leandrew Koyanagi, MD;  Location: [facility];  Service: Orthopedics;  Laterality: Left;   Social History   Occupational History  . Not on file  Tobacco Use  . Smoking status: Never Smoker  . Smokeless tobacco: Never Used  Vaping Use  . Vaping Use: Never used  Substance and Sexual Activity  . Alcohol use: Yes    Comment: occasional wine  . Drug use: No  . Sexual activity: Not on file

## 2020-03-16 ENCOUNTER — Encounter: Payer: Self-pay | Admitting: Specialist

## 2020-03-16 ENCOUNTER — Encounter: Payer: Self-pay | Admitting: Radiology

## 2020-03-19 ENCOUNTER — Telehealth: Payer: Self-pay | Admitting: Specialist

## 2020-03-19 NOTE — Telephone Encounter (Signed)
[employer] forms received. Sent to [employer].

## 2020-04-09 ENCOUNTER — Encounter: Payer: Self-pay | Admitting: Specialist

## 2020-04-10 ENCOUNTER — Other Ambulatory Visit: Payer: 59

## 2020-04-12 ENCOUNTER — Telehealth: Payer: Self-pay

## 2020-04-12 NOTE — Telephone Encounter (Signed)
[employer] disability form recevied and sent to [employer]

## 2020-04-15 ENCOUNTER — Ambulatory Visit (INDEPENDENT_AMBULATORY_CARE_PROVIDER_SITE_OTHER): Payer: 59 | Admitting: Specialist

## 2020-04-15 ENCOUNTER — Encounter: Payer: Self-pay | Admitting: Specialist

## 2020-04-15 ENCOUNTER — Other Ambulatory Visit: Payer: Self-pay

## 2020-04-15 VITALS — BP 112/74 | HR 70 | Ht 65.0 in | Wt 273.0 lb

## 2020-04-15 DIAGNOSIS — M5136 Other intervertebral disc degeneration, lumbar region: Secondary | ICD-10-CM

## 2020-04-15 DIAGNOSIS — Z4889 Encounter for other specified surgical aftercare: Secondary | ICD-10-CM

## 2020-04-15 DIAGNOSIS — Z981 Arthrodesis status: Secondary | ICD-10-CM | POA: Diagnosis not present

## 2020-04-15 DIAGNOSIS — R200 Anesthesia of skin: Secondary | ICD-10-CM

## 2020-04-15 DIAGNOSIS — R29898 Other symptoms and signs involving the musculoskeletal system: Secondary | ICD-10-CM

## 2020-04-15 NOTE — Progress Notes (Signed)
Office Visit Note   Patient: Vanessa Medina           Date of Birth: [birth year]           MRN: [id number] Visit Date: 04/15/2020              Requested by: Maryland Pink, MD [address] [place],  [place] [address] PCP: Maryland Pink, MD   Assessment & Plan: Visit Diagnoses:  1. Lumbar degenerative disc disease   2. S/P lumbar spinal fusion   3. Weakness of left leg   4. Numbness in left leg     Plan: Avoid frequent bending and stooping  No lifting greater than 10 lbs. May use ice or moist heat for pain. Weight loss is of benefit. Best medication for lumbar disc disease is arthritis medications like motrin, celebrex and naprosyn. Exercise is important to improve your indurance and does allow people to function better inspite of back pain. Plan: Knee is suffering from osteoarthritis, only real proven treatments are Weight loss, NSIADs like diclofenac and exercise. Well padded shoes help. Ice the knee that is suffering from osteoarthritis, only real proven treatments are Weight loss, NSIADs like diclofenac and exercise. Well padded shoes help. Ice the knee 2-3 times a day 15-20 mins at a time.-3 times a day 15-20 mins at a time. Hot showers in the AM.  Injection with steroid may be of benefit. Hemp CBD capsules, amazon.com 5,000-7,000 mg per bottle, 60 capsules per bottle, take one capsule twice a day. Cane in the left hand to use with left leg weight bearing. Follow-Up Instructions: No follow-ups on file.  EMG/NCV of the left leg ordered. Straight leg exercises to strengthen the quadriceps muscles.  MRI of the lumbar spine with and without contrast.   Follow-Up Instructions: No follow-ups on file.   Orders:  No orders of the defined types were placed in this encounter.  No orders of the defined types were placed in this encounter.     Procedures: No procedures performed   Clinical Data: No additional findings.   Subjective: Chief  Complaint  Patient presents with  . Lower Back - Follow-up    49 year old female with history of lumbar degenerative disc disease now post lumbar fusion with previous left hip total hip replacement. She has been having persistent pain into the Back buttocks and thigh, similar to her preop discomfort and weakness in the left leg with the left leg giving away on her. She has fallen 2 times in the last 2 weeks and is experiencing  Pain into the left thigh. No bowel or bladder difficulty. She is not able walk much further than to her vehicle,in the parking lot and does better holding onto items. There is numbness in the left Anterolateral thigh. She has had previous anterior left hip replacement.    Review of Systems  Constitutional: Positive for activity change and unexpected weight change (gaining some weight decreased exercise.). Negative for appetite change, chills, diaphoresis, fatigue and fever.  HENT: Negative.  Negative for congestion, dental problem, drooling, ear discharge, ear pain, facial swelling, hearing loss, mouth sores, nosebleeds, postnasal drip, rhinorrhea, sinus pressure, sinus pain, sneezing, sore throat, tinnitus, trouble swallowing and voice change.   Eyes: Negative.  Negative for photophobia, pain, discharge, redness, itching and visual disturbance (had a lens replacement surgery and had catarct surgery.).  Respiratory: Negative.   Cardiovascular: Negative.   Gastrointestinal: Negative.   Endocrine: Negative.   Genitourinary: Negative.  Musculoskeletal: Positive for back pain and gait problem.  Skin: Positive for color change (Left foot with color changes with bluish discoloration on occasion). Negative for pallor, rash and wound.  Allergic/Immunologic: Negative.  Negative for environmental allergies, food allergies and immunocompromised state.  Neurological: Negative for dizziness, tremors, seizures, syncope, facial asymmetry, speech difficulty, weakness,  light-headedness, numbness and headaches.  Hematological: Negative.  Negative for adenopathy. Does not bruise/bleed easily.  Psychiatric/Behavioral: Negative.  Negative for agitation, behavioral problems, confusion, decreased concentration, dysphoric mood, hallucinations, self-injury, sleep disturbance and suicidal ideas. The patient is not nervous/anxious and is not hyperactive.      Objective: Vital Signs: BP 112/74 (BP Location: Left Arm, Patient Position: Sitting)   Pulse 70   Ht 5\' 5"  (1.651 m)   Wt 273 lb (123.8 kg)   BMI 45.43 kg/m   Physical Exam Constitutional:      Appearance: She is well-developed.  HENT:     Head: Normocephalic and atraumatic.  Eyes:     Pupils: Pupils are equal, round, and reactive to light.  Pulmonary:     Effort: Pulmonary effort is normal.     Breath sounds: Normal breath sounds.  Abdominal:     General: Bowel sounds are normal.     Palpations: Abdomen is soft.  Musculoskeletal:     Cervical back: Normal range of motion and neck supple.     Lumbar back: Negative right straight leg raise test and negative left straight leg raise test.  Skin:    General: Skin is warm and dry.  Neurological:     Mental Status: She is alert and oriented to person, place, and time.  Psychiatric:        Behavior: Behavior normal.        Thought Content: Thought content normal.        Judgment: Judgment normal.     Back Exam   Tenderness  The patient is experiencing tenderness in the lumbar.  Range of Motion  Extension: abnormal  Flexion: abnormal  Lateral bend right: abnormal  Lateral bend left: abnormal  Rotation right: abnormal  Rotation left: abnormal   Muscle Strength  Right Quadriceps:  4/5  Left Quadriceps:  5/5  Right Hamstrings:  5/5  Left Hamstrings:  5/5   Tests  Straight leg raise right: negative Straight leg raise left: negative  Reflexes  Patellar: 2/4 Achilles: 2/4 Biceps: 2/4  Other  Toe walk: normal Heel walk:  normal Sensation: normal Gait: normal  Erythema: no back redness Scars: absent      Specialty Comments:  No specialty comments available.  Imaging: No results found.   PMFS History: Patient Active Problem List   Diagnosis Date Noted  . DDD (degenerative disc disease), lumbar 09/02/2019    Priority: High    Class: Chronic  . Degenerative disc disease, lumbar 09/02/2019  . Chest tightness 09/23/2018  . Bradycardia 09/23/2018  . Labile blood pressure 03/08/2018  . Chronic low back pain 09/03/2017  . History of total hip replacement, left 01/26/2016  . EDEMA 04/28/2008  . LEG PAIN, BILATERAL 12/16/2007  . CERVICAL CANCER 07/02/2007  . MORBID OBESITY 07/02/2007  . DEPRESSION 07/02/2007  . COMMON MIGRAINE 07/02/2007  . ALLERGIC RHINITIS 07/02/2007  . ASTHMA 07/02/2007  . GERD 07/02/2007  . ELEVATED BLOOD PRESSURE WITHOUT DIAGNOSIS OF HYPERTENSION 07/02/2007   Past Medical History:  Diagnosis Date  . Anemia   . Cervical cancer ([place])   . Family history of adverse reaction to anesthesia     "  my mother takes a long time time wake up"  . GERD (gastroesophageal reflux disease)   . Headache    migraine  . History of shingles   . Hypertension   . Migraine   . Multiple allergies   . Obesity   . Osteoarthritis    left hip  . PONV (postoperative nausea and vomiting)   . Sleep apnea    does not wear CPAP  . Wears glasses     Family History  Problem Relation Age of Onset  . Renal Disease Mother   . Hypertension Mother   . Sudden Cardiac Death Mother   . Heart failure Mother   . Valvular heart disease Mother   . Heart disease Mother   . Stroke Brother   . Heart attack Brother 34  . Diabetes Other   . Breast cancer Cousin        paternal side    Past Surgical History:  Procedure Laterality Date  . ABDOMINAL HYSTERECTOMY    . APPENDECTOMY    . BILIOPANCREATIC DIVERSION     with duodenal switch laparoscopic   . CHOLECYSTECTOMY    . DIAGNOSTIC LAPAROSCOPY      LOA  . ESOPHAGOGASTRODUODENOSCOPY (EGD) WITH PROPOFOL N/A 07/25/2017   Procedure: ESOPHAGOGASTRODUODENOSCOPY (EGD) WITH PROPOFOL;  Surgeon: Manya Silvas, MD;  Location: [facility] ENDOSCOPY;  Service: Endoscopy;  Laterality: N/A;  . KNEE ARTHROSCOPY    . LAPAROSCOPIC GASTRIC SLEEVE RESECTION WITH HIATAL HERNIA REPAIR    . TONSILLECTOMY    . TOTAL HIP ARTHROPLASTY Left 01/26/2016   Procedure: LEFT TOTAL HIP ARTHROPLASTY ANTERIOR APPROACH;  Surgeon: Leandrew Koyanagi, MD;  Location: [place];  Service: Orthopedics;  Laterality: Left;   Social History   Occupational History  . Not on file  Tobacco Use  . Smoking status: Never Smoker  . Smokeless tobacco: Never Used  Vaping Use  . Vaping Use: Never used  Substance and Sexual Activity  . Alcohol use: Yes    Comment: occasional wine  . Drug use: No  . Sexual activity: Not on file

## 2020-04-15 NOTE — Patient Instructions (Signed)
Avoid frequent bending and stooping  No lifting greater than 10 lbs. May use ice or moist heat for pain. Weight loss is of benefit. Best medication for lumbar disc disease is arthritis medications like motrin, celebrex and naprosyn. Exercise is important to improve your indurance and does allow people to function better inspite of back pain. Plan: Knee is suffering from osteoarthritis, only real proven treatments are Weight loss, NSIADs like diclofenac and exercise. Well padded shoes help. Ice the knee that is suffering from osteoarthritis, only real proven treatments are Weight loss, NSIADs like diclofenac and exercise. Well padded shoes help. Ice the knee 2-3 times a day 15-20 mins at a time.-3 times a day 15-20 mins at a time. Hot showers in the AM.  Injection with steroid may be of benefit. Hemp CBD capsules, amazon.com 5,000-7,000 mg per bottle, 60 capsules per bottle, take one capsule twice a day. Cane in the left hand to use with left leg weight bearing. Follow-Up Instructions: No follow-ups on file.  EMG/NCV of the left leg ordered. Straight leg exercises to strengthen the quadriceps muscles.  MRI of the lumbar spine with and without contrast.

## 2020-04-28 ENCOUNTER — Other Ambulatory Visit: Payer: Self-pay | Admitting: Specialist

## 2020-05-05 ENCOUNTER — Encounter: Payer: Self-pay | Admitting: Physical Medicine and Rehabilitation

## 2020-05-07 ENCOUNTER — Encounter: Payer: Self-pay | Admitting: Neurology

## 2020-05-13 ENCOUNTER — Ambulatory Visit: Payer: 59 | Admitting: Dermatology

## 2020-05-15 ENCOUNTER — Ambulatory Visit
Admission: RE | Admit: 2020-05-15 | Discharge: 2020-05-15 | Disposition: A | Discharge status: Home / Self Care | Payer: 59 | Source: Ambulatory Visit | Attending: Specialist | Admitting: Specialist

## 2020-05-15 DIAGNOSIS — Z4889 Encounter for other specified surgical aftercare: Secondary | ICD-10-CM

## 2020-05-15 MED ORDER — GADOBENATE DIMEGLUMINE 529 MG/ML IV SOLN
15.0000 mL | Freq: Once | INTRAVENOUS | Status: AC | PRN
  Administered 2020-05-15: 15 mL via INTRAVENOUS

## 2020-05-20 ENCOUNTER — Ambulatory Visit: Payer: Self-pay

## 2020-05-20 ENCOUNTER — Ambulatory Visit (INDEPENDENT_AMBULATORY_CARE_PROVIDER_SITE_OTHER): Payer: 59 | Admitting: Specialist

## 2020-05-20 ENCOUNTER — Encounter: Payer: Self-pay | Admitting: Specialist

## 2020-05-20 ENCOUNTER — Other Ambulatory Visit: Payer: Self-pay

## 2020-05-20 VITALS — BP 104/71 | HR 92 | Ht 65.0 in | Wt 259.0 lb

## 2020-05-20 DIAGNOSIS — Z981 Arthrodesis status: Secondary | ICD-10-CM

## 2020-05-20 DIAGNOSIS — M5136 Other intervertebral disc degeneration, lumbar region: Secondary | ICD-10-CM

## 2020-05-20 DIAGNOSIS — M533 Sacrococcygeal disorders, not elsewhere classified: Secondary | ICD-10-CM

## 2020-05-20 DIAGNOSIS — M47816 Spondylosis without myelopathy or radiculopathy, lumbar region: Secondary | ICD-10-CM

## 2020-05-20 DIAGNOSIS — G8929 Other chronic pain: Secondary | ICD-10-CM

## 2020-05-20 NOTE — Patient Instructions (Signed)
Avoid bending, stooping and avoid lifting weights greater than 10 lbs. Avoid prolong standing and walking. Avoid frequent bending and stooping  No lifting greater than 10 lbs. May use ice or moist heat for pain. Weight loss is of benefit. Handicap license is approved. Dr. Romona Curls secretary/Assistant will call to arrange for facet block at the next adjacent upper Level L1-2, if this relieves the pain then RFA of the facets may be of benefit. Continue at reduced work status.  Continue celebrex for arthritis pain

## 2020-05-20 NOTE — Progress Notes (Signed)
Office Visit Note   Patient: Vanessa Medina           Date of Birth: [birth year]           MRN: [id number] Visit Date: 05/20/2020              Requested by: Maryland Pink, MD [address] [place],  [place] [address] PCP: Maryland Pink, MD   Assessment & Plan: Visit Diagnoses:  1. Lumbar degenerative disc disease   2. S/P lumbar spinal fusion   3. Spondylosis without myelopathy or radiculopathy, lumbar region   4. Chronic left SI joint pain     Plan: Avoid bending, stooping and avoid lifting weights greater than 10 lbs. Avoid prolong standing and walking. Avoid frequent bending and stooping  No lifting greater than 10 lbs. May use ice or moist heat for pain. Weight loss is of benefit. Handicap license is approved. Dr. Romona Curls secretary/Assistant will call to arrange for facet block at the next adjacent upper Level L1-2, if this relieves the pain then RFA of the facets may be of benefit. Continue at reduced work status.  Continue celebrex for arthritis pain  Follow-Up Instructions: No follow-ups on file.   Orders:  Orders Placed This Encounter  Procedures  . Ambulatory referral to Physical Medicine Rehab   No orders of the defined types were placed in this encounter.     Procedures: No procedures performed   Clinical Data: Findings:  Narrative & Impression CLINICAL DATA:  Initial evaluation for continued lower back pain since surgery in 12/20, left lower extremity weakness with bilateral leg numbness.  EXAM: MRI LUMBAR SPINE WITHOUT AND WITH CONTRAST  TECHNIQUE: Multiplanar and multiecho pulse sequences of the lumbar spine were obtained without and with intravenous contrast.  CONTRAST:  14mL MULTIHANCE GADOBENATE DIMEGLUMINE 529 MG/ML IV SOLN  COMPARISON:  Prior CT from 01/29/2020 as well as MRI from 06/21/2019.  FINDINGS: Segmentation:  Standard.  Alignment:  Physiologic.  No interval listhesis.  Vertebrae:  Susceptibility artifact related to prior PLIF at L2 through L5 again seen. Vertebral body height maintained without acute or interval fracture. Bone marrow signal intensity within normal limits. No discrete or worrisome osseous lesions. No findings to suggest osteomyelitis discitis. No abnormal marrow edema or enhancement.  Conus medullaris and cauda equina: Conus extends to the L1 level. Conus and cauda equina appear normal.  Paraspinal and other soft tissues: Normal expected postoperative changes present within the posterior paraspinous soft tissues. No collections. Visualized visceral structures within normal limits.  Disc levels:  T12-L1: Unremarkable.  L1-2: Minimal disc bulge with disc desiccation. Superimposed chronic endplate Schmorl's node deformities. Mild to moderate facet hypertrophy. No significant spinal stenosis. Foramina remain patent. No impingement.  L2-3: Sequelae of prior PLIF with left facetectomy and foraminotomy. No residual spinal stenosis. Foramina appear patent.  L3-4: Sequelae of prior PLIF with left facetectomy and foraminotomy. No residual spinal stenosis. Left neural foramen appears patent. Residual disc bulging at the right neural foramen without significant foraminal stenosis.  L4-5: Sequelae of prior PLIF with left facetectomy and foraminotomy. No residual spinal stenosis. No residual spinal stenosis. Residual left-sided endplate spurring without significant narrowing of the left neural foramen. Right neural foramen remains widely patent.  L5-S1: Normal interspace. Mild facet hypertrophy. No canal or foraminal stenosis.  IMPRESSION: 1. Sequelae of prior PLIF at L2 through L5 with left-sided facetectomy and foraminotomy. No significant residual canal or foraminal stenosis or adverse features. 2. Mild to moderate facet hypertrophy  at L1-2 without significant stenosis.   Electronically Signed   By: Jeannine Boga M.D.    On: 05/16/2020 00:20       Subjective: Chief Complaint  Patient presents with  . Lower Back - Follow-up    49 year old female with history of lumbar DDD post lumbar fusion L2-3, L3-4 and L4-5 09/01/2020. She has pain with sitting with right leg numbness and left leg weakness. She has weakness with trying to walk. Bending forward the pain improves and she is having pain with sitting. On Monday she got an  Adjustable height desk. Trying to sit or to stand increased the pain and she had difficulty working on Monday. She tends to walk stooped over. No bowel difficulty but bladder difficulty has to relax to completely empty her bladder. She is not taking pain medications, hydrocodone stopped in July 2021. She is having difficulty sleeping awakening every couple hours, repositioning her self to to decrease the pain. Pain goes all the way down the lateral right thigh and calf into the right foot. The left leg the pain goes into the back of the thigh and calf. She has had previous left THR.    Review of Systems  Constitutional: Negative.  Negative for activity change, appetite change, chills, diaphoresis, fatigue, fever and unexpected weight change.  HENT: Negative.  Negative for congestion, dental problem, drooling, ear discharge, ear pain, facial swelling, hearing loss, mouth sores, nosebleeds, postnasal drip, rhinorrhea, sinus pressure, sinus pain, sneezing, sore throat, tinnitus, trouble swallowing and voice change.   Eyes: Positive for itching. Negative for photophobia, pain, discharge, redness and visual disturbance.  Respiratory: Negative.  Negative for apnea, cough, choking, chest tightness, shortness of breath, wheezing and stridor.   Cardiovascular: Negative.  Negative for chest pain, palpitations and leg swelling.  Gastrointestinal: Negative.  Negative for abdominal distention, abdominal pain, anal bleeding, blood in stool, constipation, diarrhea, nausea, rectal pain and vomiting.    Endocrine: Negative.  Negative for cold intolerance, heat intolerance, polydipsia, polyphagia and polyuria.  Genitourinary: Negative.  Negative for decreased urine volume, difficulty urinating, dyspareunia, dysuria, enuresis, flank pain, frequency, genital sores, hematuria, menstrual problem, pelvic pain, urgency, vaginal bleeding, vaginal discharge and vaginal pain.  Musculoskeletal: Positive for back pain. Negative for arthralgias, gait problem, joint swelling, myalgias and neck pain.  Skin: Negative.  Negative for color change, pallor and rash.  Allergic/Immunologic: Negative.  Negative for environmental allergies, food allergies and immunocompromised state.  Neurological: Positive for weakness. Negative for dizziness, tremors, seizures, syncope, facial asymmetry, speech difficulty, light-headedness, numbness and headaches.  Hematological: Negative.  Negative for adenopathy. Does not bruise/bleed easily.  Psychiatric/Behavioral: Negative.  Negative for agitation, behavioral problems, confusion, decreased concentration, dysphoric mood, hallucinations, self-injury, sleep disturbance and suicidal ideas. The patient is not nervous/anxious and is not hyperactive.      Objective: Vital Signs: BP 104/71   Pulse 92   Ht 5\' 5"  (1.651 m)   Wt 259 lb (117.5 kg)   BMI 43.10 kg/m   Physical Exam Constitutional:      Appearance: She is well-developed.  HENT:     Head: Normocephalic and atraumatic.  Eyes:     Pupils: Pupils are equal, round, and reactive to light.  Pulmonary:     Effort: Pulmonary effort is normal.     Breath sounds: Normal breath sounds.  Abdominal:     General: Bowel sounds are normal.     Palpations: Abdomen is soft.  Musculoskeletal:     Cervical back: Normal range  of motion and neck supple.     Lumbar back: Negative right straight leg raise test and negative left straight leg raise test.  Skin:    General: Skin is warm and dry.  Neurological:     Mental Status: She  is alert and oriented to person, place, and time.  Psychiatric:        Behavior: Behavior normal.        Thought Content: Thought content normal.        Judgment: Judgment normal.     Back Exam   Tenderness  The patient is experiencing tenderness in the lumbar.  Range of Motion  Extension: abnormal  Flexion: abnormal  Lateral bend right: abnormal  Lateral bend left: abnormal  Rotation right: abnormal  Rotation left: abnormal   Muscle Strength  Right Quadriceps:  4/5  Left Quadriceps:  4/5  Right Hamstrings:  4/5  Left Hamstrings:  4/5   Tests  Straight leg raise right: negative Straight leg raise left: negative  Reflexes  Patellar: 2/4 Achilles: 2/4  Other  Toe walk: normal Heel walk: normal Sensation: normal Gait: normal  Erythema: no back redness Scars: present      Specialty Comments:  No specialty comments available.  Imaging: No results found.   PMFS History: Patient Active Problem List   Diagnosis Date Noted  . DDD (degenerative disc disease), lumbar 09/02/2019    Priority: High    Class: Chronic  . Degenerative disc disease, lumbar 09/02/2019  . Chest tightness 09/23/2018  . Bradycardia 09/23/2018  . Labile blood pressure 03/08/2018  . Chronic low back pain 09/03/2017  . History of total hip replacement, left 01/26/2016  . EDEMA 04/28/2008  . LEG PAIN, BILATERAL 12/16/2007  . CERVICAL CANCER 07/02/2007  . MORBID OBESITY 07/02/2007  . DEPRESSION 07/02/2007  . COMMON MIGRAINE 07/02/2007  . ALLERGIC RHINITIS 07/02/2007  . ASTHMA 07/02/2007  . GERD 07/02/2007  . ELEVATED BLOOD PRESSURE WITHOUT DIAGNOSIS OF HYPERTENSION 07/02/2007   Past Medical History:  Diagnosis Date  . Anemia   . Cervical cancer ([place])   . Family history of adverse reaction to anesthesia     " my mother takes a long time time wake up"  . GERD (gastroesophageal reflux disease)   . Headache    migraine  . History of shingles   . Hypertension   . Migraine   .  Multiple allergies   . Obesity   . Osteoarthritis    left hip  . PONV (postoperative nausea and vomiting)   . Sleep apnea    does not wear CPAP  . Wears glasses     Family History  Problem Relation Age of Onset  . Renal Disease Mother   . Hypertension Mother   . Sudden Cardiac Death Mother   . Heart failure Mother   . Valvular heart disease Mother   . Heart disease Mother   . Stroke Brother   . Heart attack Brother 41  . Diabetes Other   . Breast cancer Cousin        paternal side    Past Surgical History:  Procedure Laterality Date  . ABDOMINAL HYSTERECTOMY    . APPENDECTOMY    . BILIOPANCREATIC DIVERSION     with duodenal switch laparoscopic   . CHOLECYSTECTOMY    . DIAGNOSTIC LAPAROSCOPY     LOA  . ESOPHAGOGASTRODUODENOSCOPY (EGD) WITH PROPOFOL N/A 07/25/2017   Procedure: ESOPHAGOGASTRODUODENOSCOPY (EGD) WITH PROPOFOL;  Surgeon: Manya Silvas, MD;  Location: [facility] ENDOSCOPY;  Service: Endoscopy;  Laterality: N/A;  . KNEE ARTHROSCOPY    . LAPAROSCOPIC GASTRIC SLEEVE RESECTION WITH HIATAL HERNIA REPAIR    . TONSILLECTOMY    . TOTAL HIP ARTHROPLASTY Left 01/26/2016   Procedure: LEFT TOTAL HIP ARTHROPLASTY ANTERIOR APPROACH;  Surgeon: Leandrew Koyanagi, MD;  Location: [place];  Service: Orthopedics;  Laterality: Left;   Social History   Occupational History  . Not on file  Tobacco Use  . Smoking status: Never Smoker  . Smokeless tobacco: Never Used  Vaping Use  . Vaping Use: Never used  Substance and Sexual Activity  . Alcohol use: Yes    Comment: occasional wine  . Drug use: No  . Sexual activity: Not on file

## 2020-05-25 ENCOUNTER — Encounter: Payer: Self-pay | Admitting: Physical Medicine and Rehabilitation

## 2020-05-25 ENCOUNTER — Ambulatory Visit (INDEPENDENT_AMBULATORY_CARE_PROVIDER_SITE_OTHER): Payer: 59 | Admitting: Physical Medicine and Rehabilitation

## 2020-05-25 ENCOUNTER — Other Ambulatory Visit: Payer: Self-pay

## 2020-05-25 VITALS — BP 107/72 | HR 80

## 2020-05-25 DIAGNOSIS — G894 Chronic pain syndrome: Secondary | ICD-10-CM

## 2020-05-25 DIAGNOSIS — M5416 Radiculopathy, lumbar region: Secondary | ICD-10-CM | POA: Diagnosis not present

## 2020-05-25 DIAGNOSIS — M961 Postlaminectomy syndrome, not elsewhere classified: Secondary | ICD-10-CM

## 2020-05-25 NOTE — Progress Notes (Signed)
Low back and leg pain. Right leg hurts all the way down. Posterior left leg pain to knee. Numeric Pain Rating Scale and Functional Assessment Average Pain 8 Pain Right Now 6 My pain is constant, sharp and aching Pain is worse with: sitting and standing Pain improves with: bending   In the last MONTH (on 0-10 scale) has pain interfered with the following?  1. General activity like being  able to carry out your everyday physical activities such as walking, climbing stairs, carrying groceries, or moving a chair?  Rating(10)  2. Relation with others like being able to carry out your usual social activities and roles such as  activities at home, at work and in your community. Rating(10)  3. Enjoyment of life such that you have  been bothered by emotional problems such as feeling anxious, depressed or irritable?  Rating(10)

## 2020-05-26 ENCOUNTER — Encounter: Payer: Self-pay | Admitting: Dermatology

## 2020-05-26 ENCOUNTER — Ambulatory Visit (INDEPENDENT_AMBULATORY_CARE_PROVIDER_SITE_OTHER): Payer: 59 | Admitting: Dermatology

## 2020-05-26 ENCOUNTER — Other Ambulatory Visit: Payer: Self-pay | Admitting: Dermatology

## 2020-05-26 ENCOUNTER — Encounter: Payer: Self-pay | Admitting: Physical Medicine and Rehabilitation

## 2020-05-26 DIAGNOSIS — L720 Epidermal cyst: Secondary | ICD-10-CM | POA: Diagnosis not present

## 2020-05-26 DIAGNOSIS — S31109A Unspecified open wound of abdominal wall, unspecified quadrant without penetration into peritoneal cavity, initial encounter: Secondary | ICD-10-CM | POA: Diagnosis not present

## 2020-05-26 DIAGNOSIS — Z1283 Encounter for screening for malignant neoplasm of skin: Secondary | ICD-10-CM | POA: Diagnosis not present

## 2020-05-26 DIAGNOSIS — S80211A Abrasion, right knee, initial encounter: Secondary | ICD-10-CM | POA: Diagnosis not present

## 2020-05-26 DIAGNOSIS — L821 Other seborrheic keratosis: Secondary | ICD-10-CM

## 2020-05-26 DIAGNOSIS — L578 Other skin changes due to chronic exposure to nonionizing radiation: Secondary | ICD-10-CM

## 2020-05-26 DIAGNOSIS — D1800 Hemangioma unspecified site: Secondary | ICD-10-CM

## 2020-05-26 DIAGNOSIS — L820 Inflamed seborrheic keratosis: Secondary | ICD-10-CM | POA: Diagnosis not present

## 2020-05-26 DIAGNOSIS — D229 Melanocytic nevi, unspecified: Secondary | ICD-10-CM

## 2020-05-26 DIAGNOSIS — L814 Other melanin hyperpigmentation: Secondary | ICD-10-CM

## 2020-05-26 DIAGNOSIS — T148XXA Other injury of unspecified body region, initial encounter: Secondary | ICD-10-CM

## 2020-05-26 MED ORDER — MUPIROCIN 2 % EX OINT: TOPICAL_OINTMENT | CUTANEOUS | 1 refills | Status: DC

## 2020-05-26 NOTE — Progress Notes (Signed)
   Follow-Up Visit   Subjective  Vanessa Medina is a 49 y.o. female who presents for the following: TBSE.  Patient presents today for full body skin exam and skin cancer screening. Patient has a few areas of concern on her mid chest and right lateral thigh that she would like to have evaluated today. The spot at her thigh has been present for several months and was scratched. No previous treatment.   Patient has no h/o skin cancer  Patient has recently had major back surgery, has DDD and is waiting on an appointment for a Spinal Cord Stimulator.  The following portions of the chart were reviewed this encounter and updated as appropriate:  Tobacco  Allergies  Meds  Problems  Med Hx  Surg Hx  Fam Hx      Review of Systems:  No other skin or systemic complaints except as noted in HPI or Assessment and Plan.  Objective  Well appearing patient in no apparent distress; mood and affect are within normal limits.  A full examination was performed including scalp, head, eyes, ears, nose, lips, neck, chest, axillae, abdomen, back, buttocks, bilateral upper extremities, bilateral lower extremities, hands, feet, fingers, toes, fingernails, and toenails. All findings within normal limits unless otherwise noted below.  Objective  Head - Anterior (Face): Smooth white papules  Objective  Chest - Medial [facility]): Erythematous keratotic or waxy stuck-on papule or plaque.   Objective  Right Ingunial Crease: erosion  Objective  Right Lateral Knee: Excoriation. No primary lesion visible.    Assessment & Plan  [place] Head - Anterior (Face)  Benign-appearing.  Observation.  Call clinic for new or changing lesions.    Inflamed seborrheic keratosis Chest - Medial [facility])  Cryotherapy today Prior to procedure, discussed risks of blister formation, small wound, skin dyspigmentation, or rare scar following cryotherapy.    Destruction of lesion - Chest - Medial [facility])  Open  wound Right Ingunial Crease  Apply mupirocin to affected areas BID   mupirocin ointment (BACTROBAN) 2 % - Right Ingunial Crease  Other Related Procedures Anaerobic and Aerobic Culture  Excoriation Right Lateral Knee  Apply mupirocin followed with bandage once daily to affected area on right lateral knee. If does not resolve, call for recheck once lesion is healed.   Lentigines - Scattered tan macules - Discussed due to sun exposure - Benign, observe - Call for any changes  Seborrheic Keratoses - Stuck-on, waxy, tan-brown papules and plaques  - Discussed benign etiology and prognosis. - Observe - Call for any changes  Melanocytic Nevi - Tan-brown and/or pink-flesh-colored symmetric macules and papules - Benign appearing on exam today - Observation - Call clinic for new or changing moles - Recommend daily use of broad spectrum spf 30+ sunscreen to sun-exposed areas.   Hemangiomas - Red papules - Discussed benign nature - Observe - Call for any changes  Actinic Damage - diffuse scaly erythematous macules with underlying dyspigmentation - Recommend daily broad spectrum sunscreen SPF 30+ to sun-exposed areas, reapply every 2 hours as needed.  - Call for new or changing lesions.   Skin cancer screening performed today.   Return in about 1 year (around 05/26/2021) for TBSE.  I, Donzetta Kohut, CMA, am acting as scribe for Forest Gleason, MD .  Documentation: I have reviewed the above documentation for accuracy and completeness, and I agree with the above.  Forest Gleason, MD

## 2020-05-26 NOTE — Progress Notes (Signed)
Vanessa Medina - 49 y.o. female MRN [id number]  Date of birth: 1971-06-05  Office Visit Note: Visit Date: 05/25/2020 PCP: [place] Pink, MD Referred by: Maryland Pink, MD  Subjective: Chief Complaint  Patient presents with  . Lower Back - Pain   HPI: Vanessa Medina is a 49 y.o. female who comes in today At the request of Dr. Basil Dess for evaluation management and possible spinal cord stimulator trial and implant.  By way of review I have seen the patient on a couple of occasions for lumbar spine injections.  Unfortunately these injections were not very beneficial.  We again reviewed notes from Dr. Louanne Skye and those notes are in the chart.  She has had chronic recalcitrant back pain and hip pain for quite some time.  This is a many year issue with her.  She has undergone prior left total hip arthroplasty by Dr. Eduard Roux in the office.  She has undergone ultimately L2-L5 instrumented lumbar fusion.  Despite the surgery she continues to have severe 8 out of 10 low back pain with referral into the right leg posterior laterally down to the foot as well as the left leg posterior to the knee.  At this point this is recalcitrant to all manner of conservative and nonconservative care.  She is not currently taking any opioid medication but in the past has tried oxycodone Nucynta and codeine without any real good tolerance to those medications and not very helpful.  The last medication she took with some hydrocodone post procedure but is not currently taking that.  She continues with muscle relaxer and gabapentin.  She has had multiple bouts of physical therapy.  She has had injections again.  No real relief at all.  She reports that she is miserable at this point and can even sit down and in fact during the entire interview today she was standing leaning over at the counter.  She does report some relief leaning over at the counter.  Pain is across the lumbosacral junction and again in the buttock and  legs.  Sitting and standing do make it worse and is a constant sharp and aching pain.  She rates it as a 10 out of 10 in terms of limiting her daily activities.  Her case is further complicated by morbid obesity for which she is taking phentermine at this point.  She also has multiple drug allergies and intolerances.  Review of Systems  Musculoskeletal: Positive for back pain and joint pain.  Neurological: Positive for tingling.  All other systems reviewed and are negative.  Otherwise per HPI.  Assessment & Plan: Visit Diagnoses:  1. Lumbar radiculopathy   2. Post laminectomy syndrome   3. Chronic pain syndrome     Plan: Findings:  Chronic severe constant low back pain referral into both hips and legs recalcitrant to all manner of care at this point including medication management with opioid and adjunct of treatment as well as physical therapy and lumbar 3 level fusion and injections.  We did spend a significant amount of time today talking about spinal cord stimulator trials and implants.  She does want to proceed with trial.  My experience with her with the injections with performed she gets very anxious with that and I feel like the best approach may be to have her seen by Dr. Davy Pique at [facility] to have this performed under sedation.  Nonetheless we will obtain pretrial and preimplantation psychological evaluation.  I also  want to obtain thoracic spine MRI preimplantation.  She reports that her insurance in the past is denied MRIs.  She needs the MRI to be completed really in terms of a preplanning for potential spinal cord stimulator which is a procedure of last resort for her.  Her pain is quite severe and debilitating at this point.  I do think she is a reasonable candidate for the trial.    Meds & Orders: No orders of the defined types were placed in this encounter.   Orders Placed This Encounter  Procedures  . MR THORACIC SPINE WO CONTRAST  .  Ambulatory referral to Neuropsychology    Follow-up: Return if symptoms worsen or fail to improve.   Procedures: No procedures performed  No notes on file   Clinical History: MRI LUMBAR SPINE WITHOUT AND WITH CONTRAST  TECHNIQUE: Multiplanar and multiecho pulse sequences of the lumbar spine were obtained without and with intravenous contrast.  CONTRAST:  21mL MULTIHANCE GADOBENATE DIMEGLUMINE 529 MG/ML IV SOLN  COMPARISON:  Prior CT from 01/29/2020 as well as MRI from 06/21/2019.  FINDINGS: Segmentation:  Standard.  Alignment:  Physiologic.  No interval listhesis.  Vertebrae: Susceptibility artifact related to prior PLIF at L2 through L5 again seen. Vertebral body height maintained without acute or interval fracture. Bone marrow signal intensity within normal limits. No discrete or worrisome osseous lesions. No findings to suggest osteomyelitis discitis. No abnormal marrow edema or enhancement.  Conus medullaris and cauda equina: Conus extends to the L1 level. Conus and cauda equina appear normal.  Paraspinal and other soft tissues: Normal expected postoperative changes present within the posterior paraspinous soft tissues. No collections. Visualized visceral structures within normal limits.  Disc levels:  T12-L1: Unremarkable.  L1-2: Minimal disc bulge with disc desiccation. Superimposed chronic endplate Schmorl's node deformities. Mild to moderate facet hypertrophy. No significant spinal stenosis. Foramina remain patent. No impingement.  L2-3: Sequelae of prior PLIF with left facetectomy and foraminotomy. No residual spinal stenosis. Foramina appear patent.  L3-4: Sequelae of prior PLIF with left facetectomy and foraminotomy. No residual spinal stenosis. Left neural foramen appears patent. Residual disc bulging at the right neural foramen without significant foraminal stenosis.  L4-5: Sequelae of prior PLIF with left facetectomy and  foraminotomy. No residual spinal stenosis. No residual spinal stenosis. Residual left-sided endplate spurring without significant narrowing of the left neural foramen. Right neural foramen remains widely patent.  L5-S1: Normal interspace. Mild facet hypertrophy. No canal or foraminal stenosis.  IMPRESSION: 1. Sequelae of prior PLIF at L2 through L5 with left-sided facetectomy and foraminotomy. No significant residual canal or foraminal stenosis or adverse features. 2. Mild to moderate facet hypertrophy at L1-2 without significant stenosis.   Electronically Signed   By: Jeannine Boga M.D.   On: 05/16/2020 00:20   She reports that she has never smoked. She has never used smokeless tobacco. No results for input(s): HGBA1C, LABURIC in the last 8760 hours.  Objective:  VS:  HT:    WT:   BMI:     BP:107/72  HR:80bpm  TEMP: ( )  RESP:  Physical Exam Vitals and nursing note reviewed.  Constitutional:      General: She is not in acute distress.    Appearance: Normal appearance. She is well-developed. She is obese. She is not ill-appearing.  HENT:     Head: Normocephalic and atraumatic.  Eyes:     Conjunctiva/sclera: Conjunctivae normal.     Pupils: Pupils are equal, round, and reactive to light.  Cardiovascular:  Rate and Rhythm: Normal rate.     Pulses: Normal pulses.  Pulmonary:     Effort: Pulmonary effort is normal.  Abdominal:     General: There is no distension.     Palpations: Abdomen is soft.     Tenderness: There is no abdominal tenderness.  Musculoskeletal:     Cervical back: Normal range of motion and neck supple. No tenderness.     Right lower leg: No edema.     Left lower leg: No edema.     Comments: Patient appears very uncomfortable and stands at the counter in a forward flexed lumbar spine position.  With the knee extension she gets severe pain across the lumbosacral junction and has to bend forward.  She ambulates with a forward flexed lumbar  spine.  She has pain over the greater trochanters and PSIS to palpation.  She has good movement of both hips.  She has good distal strength and no clonus.  Skin:    General: Skin is warm and dry.     Findings: No bruising, erythema, lesion or rash.  Neurological:     General: No focal deficit present.     Mental Status: She is alert and oriented to person, place, and time.     Sensory: No sensory deficit.     Motor: No weakness or abnormal muscle tone.     Coordination: Coordination normal.     Gait: Gait abnormal.  Psychiatric:        Mood and Affect: Mood normal.        Behavior: Behavior normal.     Comments: Anxious     Ortho Exam  Imaging: No results found.  Past Medical/Family/Surgical/Social History: Medications & Allergies reviewed per EMR, new medications updated. Patient Active Problem List   Diagnosis Date Noted  . DDD (degenerative disc disease), lumbar 09/02/2019    Class: Chronic  . Degenerative disc disease, lumbar 09/02/2019  . Chest tightness 09/23/2018  . Bradycardia 09/23/2018  . Labile blood pressure 03/08/2018  . Chronic low back pain 09/03/2017  . History of total hip replacement, left 01/26/2016  . EDEMA 04/28/2008  . LEG PAIN, BILATERAL 12/16/2007  . CERVICAL CANCER 07/02/2007  . MORBID OBESITY 07/02/2007  . DEPRESSION 07/02/2007  . COMMON MIGRAINE 07/02/2007  . ALLERGIC RHINITIS 07/02/2007  . ASTHMA 07/02/2007  . GERD 07/02/2007  . ELEVATED BLOOD PRESSURE WITHOUT DIAGNOSIS OF HYPERTENSION 07/02/2007   Past Medical History:  Diagnosis Date  . Anemia   . Cervical cancer ([place])   . Family history of adverse reaction to anesthesia     " my mother takes a long time time wake up"  . GERD (gastroesophageal reflux disease)   . Headache    migraine  . History of shingles   . Hypertension   . Migraine   . Multiple allergies   . Obesity   . Osteoarthritis    left hip  . PONV (postoperative nausea and vomiting)   . Sleep apnea    does not  wear CPAP  . Wears glasses    Family History  Problem Relation Age of Onset  . Renal Disease Mother   . Hypertension Mother   . Sudden Cardiac Death Mother   . Heart failure Mother   . Valvular heart disease Mother   . Heart disease Mother   . Stroke Brother   . Heart attack Brother 66  . Diabetes Other   . Breast cancer Cousin        paternal  side   Past Surgical History:  Procedure Laterality Date  . ABDOMINAL HYSTERECTOMY    . APPENDECTOMY    . BILIOPANCREATIC DIVERSION     with duodenal switch laparoscopic   . CHOLECYSTECTOMY    . DIAGNOSTIC LAPAROSCOPY     LOA  . ESOPHAGOGASTRODUODENOSCOPY (EGD) WITH PROPOFOL N/A 07/25/2017   Procedure: ESOPHAGOGASTRODUODENOSCOPY (EGD) WITH PROPOFOL;  Surgeon: Manya Silvas, MD;  Location: [facility] ENDOSCOPY;  Service: Endoscopy;  Laterality: N/A;  . KNEE ARTHROSCOPY    . LAPAROSCOPIC GASTRIC SLEEVE RESECTION WITH HIATAL HERNIA REPAIR    . TONSILLECTOMY    . TOTAL HIP ARTHROPLASTY Left 01/26/2016   Procedure: LEFT TOTAL HIP ARTHROPLASTY ANTERIOR APPROACH;  Surgeon: Leandrew Koyanagi, MD;  Location: [facility];  Service: Orthopedics;  Laterality: Left;  . TRANSFORAMINAL LUMBAR INTERBODY FUSION (TLIF) WITH PEDICLE SCREW FIXATION 3 LEVEL  08/2019   Basil Dess, MD L2-L5   Social History   Occupational History  . Not on file  Tobacco Use  . Smoking status: Never Smoker  . Smokeless tobacco: Never Used  Vaping Use  . Vaping Use: Never used  Substance and Sexual Activity  . Alcohol use: Yes    Comment: occasional wine  . Drug use: No  . Sexual activity: Not on file

## 2020-05-26 NOTE — Patient Instructions (Signed)
Recommend daily broad spectrum sunscreen SPF 30+ to sun-exposed areas, reapply every 2 hours as needed. Call for new or changing lesions.   Prior to procedure, discussed risks of blister formation, small wound, skin dyspigmentation, or rare scar following cryotherapy.  Liquid nitrogen was applied for 10-12 seconds to the skin lesion and the expected blistering or scabbing reaction explained. Do not pick at the area. Patient reminded to expect hypopigmented scars from the procedure. Return if lesion fails to fully resolve.  Cryotherapy Aftercare  . Wash gently with soap and water everyday.   . Apply Vaseline and Band-Aid daily until healed.   

## 2020-05-27 NOTE — Addendum Note (Signed)
Addended by: Alfonso Patten on: 05/27/2020 06:04 PM   Modules accepted: Level of Service

## 2020-05-31 LAB — ANAEROBIC AND AEROBIC CULTURE

## 2020-05-31 NOTE — Progress Notes (Signed)
Cardiology Office Note    Date:  06/01/2020   ID:  Vanessa Medina, DOB Apr 05, 1971, MRN [id number]  PCP:  [place]land Pink, MD  Cardiologist:  Ida Rogue, MD  Electrophysiologist:  None   Chief Complaint: Dizziness  History of Present Illness:   Vanessa Medina is a 49 y.o. female with history of labile hypertension with orthostatic hypotension, morbid obesity s/p gastric sleeve, chronic abdominal pain, family history of heart disease in her mother, cervical cancer, anemia, migraine disorder, sleep apnea, and back pain status post surgery who presents for  evaluation of dizziness/low BP.  She has a history of labile hypertension that more recently has been managed with midodrine.  She was seen in 2019 for risk stratification in preparation for bariatric surgery with subsequent echo demonstrating EF of 55 to 60%, no regional wall motion abnormalities, normal LV diastolic function, normal RV systolic function and ventricular cavity size, and no significant valvular abnormalities.  She subsequently underwent significant abdominal surgery in 08/2018 with biliopancreatic diversion with duodenal switch and lysis of significant adhesions.  Postoperative course was complicated requiring multiple trips back to the hospital for various etiologies including dehydration, bradycardia, and soft BP leading medications to be held.  She was most recently seen in the office in 02/2020 with a weight trend from 247 to 71 pounds.  She was taking midodrine 10 mg 3 times daily with periodic dizziness.  Pressures at home range from the 90s to 1 teens with standing.  BP was 144/76 at that time with a heart rate of 79 bpm.  No changes were made and it was recommended she continue midodrine 10 mg 3 times daily.  She comes in today noting a 3 to 4-week history of worsening dizziness, orthostasis, and generalized fatigue.  Over this timeframe her BPs have predominantly been in the 64Q systolic with a rare reading in the  03K systolic.  She remains on midodrine 10 mg 3 times daily.  It is noted she has both lisinopril and Maxide/25 on her medication list which appeared to be new.  However, she indicates she is quite certain she is not taking these.  It appears these medications have been refilled by her PCP and [facility] to [facility] with lisinopril last being shipped out on 05/16/2020 and Maxide last being shipped out on 05/19/2020.  She does indicate she has several packages at home which have been unopened.  She will review her home medications and contact us with what she is taking.  She continues to deal with significant back pain and takes Robaxin for this.   Labs independently reviewed: 03/2020 - Hgb 12.8, PLT 318, albumin 4.3, AST/ALT normal, TC 221, TG 220, HDL 52, LDL 125, magnesium 2.3, TSH normal  Past Medical History:  Diagnosis Date  . Anemia   . Cervical cancer ([place])   . Family history of adverse reaction to anesthesia     " my mother takes a long time time wake up"  . GERD (gastroesophageal reflux disease)   . Headache    migraine  . History of shingles   . Hypertension   . Migraine   . Multiple allergies   . Obesity   . Osteoarthritis    left hip  . PONV (postoperative nausea and vomiting)   . Sleep apnea    does not wear CPAP  . Wears glasses     Past Surgical History:  Procedure Laterality Date  . ABDOMINAL HYSTERECTOMY    . APPENDECTOMY    .  BILIOPANCREATIC DIVERSION     with duodenal switch laparoscopic   . CHOLECYSTECTOMY    . DIAGNOSTIC LAPAROSCOPY     LOA  . ESOPHAGOGASTRODUODENOSCOPY (EGD) WITH PROPOFOL N/A 07/25/2017   Procedure: ESOPHAGOGASTRODUODENOSCOPY (EGD) WITH PROPOFOL;  Surgeon: Manya Silvas, MD;  Location[facility] ENDOSCOPY;  Service: Endoscopy;  Laterality: N/A;  . KNEE ARTHROSCOPY    . LAPAROSCOPIC GASTRIC SLEEVE RESECTION WITH HIATAL HERNIA REPAIR    . TONSILLECTOMY    . TOTAL HIP ARTHROPLASTY Left 01/26/2016   Procedure: LEFT TOTAL HIP ARTHROPLASTY ANTERIOR  APPROACH;  Surgeon: Leandrew Koyanagi, MD;  Location: [place];  Service: Orthopedics;  Laterality: Left;  . TRANSFORAMINAL LUMBAR INTERBODY FUSION (TLIF) WITH PEDICLE SCREW FIXATION 3 LEVEL  08/2019   Basil Dess, MD L2-L5    Current Medications: Current Meds  Medication Sig  . celecoxib (CELEBREX) 200 MG capsule Take 1 capsule (200 mg total) by mouth 2 (two) times daily.  . cyanocobalamin (,VITAMIN B-12,) 1000 MCG/ML injection Inject 1,000 mcg into the muscle every 30 (thirty) days.  Marland Kitchen gabapentin (NEURONTIN) 300 MG capsule TAKE 1 CAPSULE BY MOUTH  EVERY NIGHT AT BEDTIME  . Lifitegrast (XIIDRA) 5 % SOLN INSTILL 1 DROP IN BOTH EYES TWICE DAILY APPROXIMATELY 12 HOURS APART  . lisinopril (ZESTRIL) 10 MG tablet Take 1 tablet by mouth daily.  . methocarbamol (ROBAXIN) 500 MG tablet TAKE 1 TABLET(500 MG) BY MOUTH EVERY 8 HOURS AS NEEDED FOR MUSCLE SPASMS  . midodrine (PROAMATINE) 10 MG tablet Take 1 tablet (10 mg total) by mouth 3 (three) times daily.  . mupirocin ointment (BACTROBAN) 2 % Apply to affected areas on R. Lateral knee and R top of thigh  . phentermine (ADIPEX-P) 37.5 MG tablet Take 1/2 tablet daily in am before breakfast  . pramipexole (MIRAPEX) 0.125 MG tablet Take 0.5 mg by mouth at bedtime. Pt takes 4 tablets at bedtime  . topiramate (TOPAMAX) 25 MG tablet Take 25 mg by mouth 2 (two) times daily.  Marland Kitchen triamterene-hydrochlorothiazide (MAXZIDE-25) 37.5-25 MG tablet Take 1 tablet by mouth daily.    Allergies:   Shellfish allergy, Meperidine hcl, Morphine, Bee pollen, Ivp dye [iodinated diagnostic agents], Codeine, Oxycodone-acetaminophen, Pentazocine lactate, and Propoxyphene n-acetaminophen   Social History   Socioeconomic History  . Marital status: Divorced    Spouse name: Not on file  . Number of children: Not on file  . Years of education: Not on file  . Highest education level: Not on file  Occupational History  . Not on file  Tobacco Use  . Smoking status: Never Smoker  .  Smokeless tobacco: Never Used  Vaping Use  . Vaping Use: Never used  Substance and Sexual Activity  . Alcohol use: Yes    Comment: occasional wine  . Drug use: No  . Sexual activity: Not on file  Other Topics Concern  . Not on file  Social History Narrative  . Not on file   Social Determinants of Health   Financial Resource Strain:   . Difficulty of Paying Living Expenses: Not on file  Food Insecurity:   . Worried About Charity fundraiser in the Last Year: Not on file  . Ran Out of Food in the Last Year: Not on file  Transportation Needs:   . Lack of Transportation (Medical): Not on file  . Lack of Transportation (Non-Medical): Not on file  Physical Activity:   . Days of Exercise per Week: Not on file  . Minutes of Exercise per Session: Not  on file  Stress:   . Feeling of Stress : Not on file  Social Connections:   . Frequency of Communication with Friends and Family: Not on file  . Frequency of Social Gatherings with Friends and Family: Not on file  . Attends Religious Services: Not on file  . Active Member of Clubs or Organizations: Not on file  . Attends Archivist Meetings: Not on file  . Marital Status: Not on file     Family History:  The patient's family history includes Breast cancer in her cousin; Diabetes in an other family member; Heart attack (age of onset: 65) in her brother; Heart disease in her mother; Heart failure in her mother; Hypertension in her mother; Renal Disease in her mother; Stroke in her brother; Sudden Cardiac Death in her mother; Valvular heart disease in her mother.  ROS:   Review of Systems  Constitutional: Positive for malaise/fatigue. Negative for chills, diaphoresis, fever and weight loss.  HENT: Negative for congestion.   Eyes: Negative for discharge and redness.  Respiratory: Negative for cough, sputum production, shortness of breath and wheezing.   Cardiovascular: Negative for chest pain, palpitations, orthopnea,  claudication, leg swelling and PND.  Gastrointestinal: Positive for nausea. Negative for abdominal pain, heartburn and vomiting.  Musculoskeletal: Positive for back pain. Negative for falls and myalgias.  Skin: Negative for rash.  Neurological: Positive for dizziness and weakness. Negative for tingling, tremors, sensory change, speech change, focal weakness and loss of consciousness.  Endo/Heme/Allergies: Does not bruise/bleed easily.  Psychiatric/Behavioral: Negative for substance abuse. The patient is not nervous/anxious.   All other systems reviewed and are negative.    EKGs/Labs/Other Studies Reviewed:    Studies reviewed were summarized above. The additional studies were reviewed today:  2D echo 08/2018: - Left ventricle: The cavity size was normal. Wall thickness was  increased in a pattern of mild LVH. Systolic function was normal.  The estimated ejection fraction was in the range of 55% to 60%.  Wall motion was normal; there were no regional wall motion  abnormalities. Left ventricular diastolic function parameters  were normal.  - Right ventricle: The cavity size was normal. Wall thickness was  normal. Systolic function was normal.    EKG:  EKG was not ordered today due to patient comorbid conditions including ongoing back pain and current outfit.    Recent Labs: 08/22/2019: ALT 24 09/06/2019: BUN 10; Creatinine, Ser 1.05; Hemoglobin 9.0; Platelets 249; Potassium 3.6; Sodium 139  Recent Lipid Panel No results found for: CHOL, TRIG, HDL, CHOLHDL, VLDL, LDLCALC, LDLDIRECT  PHYSICAL EXAM:    VS:  BP 109/69 (BP Location: Left Arm, Patient Position: Sitting, Cuff Size: Large)   Pulse 80   Ht 5\' 4"  (1.626 m)   Wt 263 lb (119.3 kg)   SpO2 95%   BMI 45.14 kg/m   BMI: Body mass index is 45.14 kg/m.  Physical Exam Constitutional:      Appearance: She is well-developed.  HENT:     Head: Normocephalic and atraumatic.  Eyes:     General:        Right eye: No  discharge.        Left eye: No discharge.  Neck:     Vascular: No JVD.  Cardiovascular:     Rate and Rhythm: Normal rate and regular rhythm.     Pulses: No midsystolic click and no opening snap.          Dorsalis pedis pulses are 2+ on the right  side and 2+ on the left side.       Posterior tibial pulses are 2+ on the right side and 2+ on the left side.     Heart sounds: Normal heart sounds, S1 normal and S2 normal. Heart sounds not distant. No murmur heard.  No friction rub.  Pulmonary:     Effort: Pulmonary effort is normal. No respiratory distress.     Breath sounds: Normal breath sounds. No decreased breath sounds, wheezing or rales.  Chest:     Chest wall: No tenderness.  Abdominal:     General: There is no distension.     Palpations: Abdomen is soft.     Tenderness: There is no abdominal tenderness.  Musculoskeletal:     Right upper arm: Normal.     Left upper arm: Normal.     Cervical back: Normal range of motion.     Right foot: Normal.     Left foot: Normal.     Comments: Unable to assess musculoskeletal strength in the lower extremities secondary to chronic back pain  Skin:    General: Skin is warm and dry.     Nails: There is no clubbing.  Neurological:     Mental Status: She is alert and oriented to person, place, and time.  Psychiatric:        Speech: Speech normal.        Behavior: Behavior normal.        Thought Content: Thought content normal.        Judgment: Judgment normal.     Wt Readings from Last 3 Encounters:  06/01/20 263 lb (119.3 kg)  05/20/20 259 lb (117.5 kg)  04/15/20 273 lb (123.8 kg)    Orthostatic vital signs: Lying: 100/68, 76 bpm, dizzy Sitting: 104/76, 78 bpm, dizzy Standing: 88/64, 91 bpm   ASSESSMENT & PLAN:   1. Labile hypertension/orthostatic hypotension: Known history of labile hypertension with orthostatic hypotension on midodrine.  Review of patient's medication list today shows lisinopril and Maxide/25.  It appears these  medications have been filled by outside office and sent to her mail order pharmacy with lisinopril last being shipped out on 05/16/2020 and Maxide as being shipped out 05/19/2020.  She does indicate she has some packages at home that are unopened and is quite certain she has not been taking either of these medications.  She is on several medications including Robaxin, Mirapex, and Topamax which could contribute to her overall symptoms and issues with hypotension.  She is also on Mirapex which can contribute to her symptoms and issues with hypotension and orthostasis.  Management of these medications will need to be addressed by the prescribing provider.  Add fludrocortisone 0.1 mg daily.  She will continue midodrine 10 mg 3 times daily.  Continue adequate hydration.  She will contact our office to let us know if she has been taking either lisinopril or Maxide.  Recent labs unrevealing as outlined above.  Discussed with her primary cardiologist.   Disposition: F/u with Dr. Rockey Situ or an APP in 2 months.   Medication Adjustments/Labs and Tests Ordered: Current medicines are reviewed at length with the patient today.  Concerns regarding medicines are outlined above. Medication changes, Labs and Tests ordered today are summarized above and listed in the Patient Instructions accessible in Encounters.   Signed, Christell Faith, PA-C 06/01/2020 4:46 PM     [address] [place], [place] [address] [phone]

## 2020-06-01 ENCOUNTER — Other Ambulatory Visit: Payer: Self-pay

## 2020-06-01 ENCOUNTER — Ambulatory Visit (INDEPENDENT_AMBULATORY_CARE_PROVIDER_SITE_OTHER): Payer: 59 | Admitting: Physician Assistant

## 2020-06-01 ENCOUNTER — Encounter: Payer: Self-pay | Admitting: Physician Assistant

## 2020-06-01 VITALS — BP 109/69 | HR 80 | Ht 64.0 in | Wt 263.0 lb

## 2020-06-01 DIAGNOSIS — I959 Hypotension, unspecified: Secondary | ICD-10-CM

## 2020-06-01 DIAGNOSIS — R0989 Other specified symptoms and signs involving the circulatory and respiratory systems: Secondary | ICD-10-CM

## 2020-06-01 DIAGNOSIS — I951 Orthostatic hypotension: Secondary | ICD-10-CM | POA: Diagnosis not present

## 2020-06-01 MED ORDER — FLUDROCORTISONE ACETATE 0.1 MG PO TABS: 0.1000 mg | ORAL_TABLET | Freq: Every day | ORAL | 5 refills | Status: DC

## 2020-06-01 NOTE — Patient Instructions (Signed)
Medication Instructions:  Your physician has recommended you make the following change in your medication:  START Florinef 0.1 mg (1 tablet) by mouth once a day.  *If you need a refill on your cardiac medications before your next appointment, please call your pharmacy*   Follow-Up: At [facility], you and your health needs are our priority.  As part of our continuing mission to provide you with exceptional heart care, we have created designated Provider Care Teams.  These Care Teams include your primary Cardiologist (physician) and Advanced Practice Providers (APPs -  Physician Assistants and Nurse Practitioners) who all work together to provide you with the care you need, when you need it.  We recommend signing up for the patient portal called "MyChart".  Sign up information is provided on this After Visit Summary.  MyChart is used to connect with patients for Virtual Visits (Telemedicine).  Patients are able to view lab/test results, encounter notes, upcoming appointments, etc.  Non-urgent messages can be sent to your provider as well.   To learn more about what you can do with MyChart, go to [email or web address].    Your next appointment:   2 month(s)  The format for your next appointment:   In Person  Provider:   You may see Ida Rogue, MD or one of the following Advanced Practice Providers on your designated Care Team:    Murray Hodgkins, NP  Christell Faith, PA-C  Marrianne Mood, PA-C  Cadence Newport, [place]

## 2020-06-02 ENCOUNTER — Encounter: Payer: Self-pay | Admitting: Physical Medicine and Rehabilitation

## 2020-06-03 ENCOUNTER — Other Ambulatory Visit: Payer: Self-pay | Admitting: Obstetrics and Gynecology

## 2020-06-03 DIAGNOSIS — N6320 Unspecified lump in the left breast, unspecified quadrant: Secondary | ICD-10-CM

## 2020-06-07 ENCOUNTER — Other Ambulatory Visit: Payer: Self-pay

## 2020-06-07 DIAGNOSIS — R202 Paresthesia of skin: Secondary | ICD-10-CM

## 2020-06-08 ENCOUNTER — Ambulatory Visit (INDEPENDENT_AMBULATORY_CARE_PROVIDER_SITE_OTHER): Payer: 59 | Admitting: Neurology

## 2020-06-08 ENCOUNTER — Other Ambulatory Visit: Payer: Self-pay

## 2020-06-08 DIAGNOSIS — M5416 Radiculopathy, lumbar region: Secondary | ICD-10-CM

## 2020-06-08 DIAGNOSIS — R202 Paresthesia of skin: Secondary | ICD-10-CM

## 2020-06-08 NOTE — Procedures (Signed)
[facility]  [address]  [place], [place] [address] Tel: [phone] Fax:  [phone] Test Date:  06/08/2020  Patient: Vanessa Medina DOB: 05-Mar-1971 Physician: Narda Amber, DO  Sex: Female Height: 5\' 4"  Ref Phys: Basil Dess, MD  ID#: [id number]   Technician:    Patient Complaints: This is a 49 year old female with history of lumbar fusion referred for evaluation of left leg weakness.  NCV & EMG Findings: Extensive electrodiagnostic testing of the left lower extremity shows: 1. Left sural and superficial peroneal sensory responses are within normal limits. 2. Left peroneal and tibial motor responses are within normal limits. 3. Chronic motor axonal loss changes are seen affecting the left rectus femoris and abductor longus muscles, without accompanying active denervation.  Impression: 1. Chronic L3-4 radiculopathy affecting the left lower extremity, mild. 2. There is no evidence of a sensorimotor polyneuropathy affecting the left lower extremity.   ___________________________ Narda Amber, DO    Nerve Conduction Studies Anti Sensory Summary Table   Stim Site NR Peak (ms) Norm Peak (ms) P-T Amp (V) Norm P-T Amp  Left Sup Peroneal Anti Sensory (Ant Lat Mall)  32C  12 cm    2.2 <4.5 8.7 >5  Left Sural Anti Sensory (Lat Mall)  32C  Calf    2.5 <4.5 9.1 >5   Motor Summary Table   Stim Site NR Onset (ms) Norm Onset (ms) O-P Amp (mV) Norm O-P Amp Site1 Site2 Delta-0 (ms) Dist (cm) Vel (m/s) Norm Vel (m/s)  Left Peroneal Motor (Ext Dig Brev)  32C  Ankle    3.0 <5.5 4.5 >3 B Fib Ankle 7.4 38.0 51 >40  B Fib    10.4  4.0  Poplt B Fib 1.4 9.0 64 >40  Poplt    11.8  4.0         Left Tibial Motor (Abd Hall Brev)  32C  Ankle    2.7 <6.0 14.2 >8 Knee Ankle 9.2 41.0 45 >40  Knee    11.9  11.0           EMG   Side Muscle Ins Act Fibs Psw Fasc Number Recrt Dur Dur. Amp Amp. Poly Poly. Comment  Left AntTibialis Nml Nml Nml Nml Nml Nml Nml Nml  Nml Nml Nml Nml N/A  Left BicepsFemS Nml Nml Nml Nml Nml Nml Nml Nml Nml Nml Nml Nml N/A  Left Gastroc Nml Nml Nml Nml Nml Nml Nml Nml Nml Nml Nml Nml N/A  Left RectFemoris Nml Nml Nml Nml 1- Rapid Some 1+ Some 1+ Some 1+ N/A  Left GluteusMed Nml Nml Nml Nml Nml Nml Nml Nml Nml Nml Nml Nml N/A  Left AdductorLong Nml Nml Nml Nml 1- Mod-R Some 1+ Some 1+ Some 1+ N/A      Waveforms:

## 2020-06-08 NOTE — Progress Notes (Signed)
Bacterial culture showed  Result 1 Mixed skin flora  Comment: Light growth  Small amount of normal skin bacteria.  Recommend continuing mupirocin and covering until completely healed.

## 2020-06-14 ENCOUNTER — Other Ambulatory Visit: Payer: Self-pay

## 2020-06-14 ENCOUNTER — Ambulatory Visit (INDEPENDENT_AMBULATORY_CARE_PROVIDER_SITE_OTHER): Payer: 59 | Admitting: Physical Medicine and Rehabilitation

## 2020-06-14 ENCOUNTER — Encounter: Payer: Self-pay | Admitting: Physical Medicine and Rehabilitation

## 2020-06-14 VITALS — BP 98/65 | HR 67

## 2020-06-14 DIAGNOSIS — G894 Chronic pain syndrome: Secondary | ICD-10-CM

## 2020-06-14 DIAGNOSIS — M5416 Radiculopathy, lumbar region: Secondary | ICD-10-CM | POA: Diagnosis not present

## 2020-06-14 DIAGNOSIS — M961 Postlaminectomy syndrome, not elsewhere classified: Secondary | ICD-10-CM | POA: Diagnosis not present

## 2020-06-14 NOTE — Progress Notes (Signed)
Pt state lower back pain. Pt state walking, standing and climbing stairs makes the pain worse. Pt state she has to sit down and  rest her legs to ease the pain. Pt has hx of inj on 03/04/20 Pt state the inj never worked.  Numeric Pain Rating Scale and Functional Assessment Average Pain 8   In the last MONTH (on 0-10 scale) has pain interfered with the following?  1. General activity like being  able to carry out your everyday physical activities such as walking, climbing stairs, carrying groceries, or moving a chair?  Rating(10)   +Driver, -BT, -Dye Allergies.

## 2020-06-15 ENCOUNTER — Other Ambulatory Visit: Payer: Self-pay | Admitting: Radiology

## 2020-06-16 ENCOUNTER — Other Ambulatory Visit: Payer: Self-pay

## 2020-06-16 ENCOUNTER — Ambulatory Visit
Admission: RE | Admit: 2020-06-16 | Discharge: 2020-06-16 | Disposition: A | Discharge status: Home / Self Care | Payer: 59 | Source: Ambulatory Visit | Attending: Physical Medicine and Rehabilitation | Admitting: Physical Medicine and Rehabilitation

## 2020-06-16 DIAGNOSIS — G894 Chronic pain syndrome: Secondary | ICD-10-CM

## 2020-06-16 DIAGNOSIS — M961 Postlaminectomy syndrome, not elsewhere classified: Secondary | ICD-10-CM

## 2020-06-17 ENCOUNTER — Encounter: Payer: Self-pay | Admitting: Physical Medicine and Rehabilitation

## 2020-06-18 ENCOUNTER — Other Ambulatory Visit: Payer: Self-pay | Admitting: Surgery

## 2020-06-18 MED ORDER — METHOCARBAMOL 500 MG PO TABS: 500.0000 mg | ORAL_TABLET | Freq: Two times a day (BID) | ORAL | 0 refills | Status: DC | PRN

## 2020-06-18 NOTE — Telephone Encounter (Signed)
I refilled robaxin

## 2020-06-18 NOTE — Telephone Encounter (Signed)
Celebrex last filled by Dr. Louanne Skye March 11, 2020.  Was also given 3 refills with that.  Too early to fill.  She also may need to come in to get labs drawn to check CBC and c-Met

## 2020-06-21 ENCOUNTER — Encounter: Payer: Self-pay | Admitting: Physical Medicine and Rehabilitation

## 2020-06-21 ENCOUNTER — Other Ambulatory Visit: Payer: Self-pay | Admitting: Physical Medicine and Rehabilitation

## 2020-06-21 DIAGNOSIS — G894 Chronic pain syndrome: Secondary | ICD-10-CM

## 2020-06-21 DIAGNOSIS — M961 Postlaminectomy syndrome, not elsewhere classified: Secondary | ICD-10-CM

## 2020-06-21 DIAGNOSIS — M5416 Radiculopathy, lumbar region: Secondary | ICD-10-CM

## 2020-06-21 NOTE — Progress Notes (Signed)
Sent referral for SCS trial and implant to Dr. Davy Pique. Still waiting on report from psych eval.

## 2020-06-21 NOTE — Telephone Encounter (Signed)
Will you please sign off on this it will not allow me to.

## 2020-06-22 ENCOUNTER — Other Ambulatory Visit: Payer: Self-pay | Admitting: Radiology

## 2020-06-22 ENCOUNTER — Encounter: Payer: Self-pay | Admitting: Physical Medicine and Rehabilitation

## 2020-06-22 MED ORDER — CELECOXIB 200 MG PO CAPS
200.0000 mg | ORAL_CAPSULE | Freq: Two times a day (BID) | ORAL | 3 refills | Status: DC
Start: 2020-06-22 — End: 2020-08-02

## 2020-06-22 MED ORDER — METHOCARBAMOL 500 MG PO TABS: 500.0000 mg | ORAL_TABLET | Freq: Two times a day (BID) | ORAL | 0 refills | Status: DC | PRN

## 2020-06-22 NOTE — Progress Notes (Signed)
Van[place] - 49 y.o. female MRN [id number]  Date of birth: 1971/03/15  Office Visit Note: Visit Date: 06/14/2020 PCP: Maryland Pink, MD Referred by: Maryland Pink, MD  Subjective: Chief Complaint  Patient presents with  . Lower Back - Pain   HPI: Vanessa [place] is a 49 y.o. female who comes in today For evaluation management of chronic worsening severe back pain with some referral into the upper hips and legs.  Her history is well-known to [name] through Dr. Basil Dess who is her spine surgeon.  She has failed all manner of conservative and nonconservative care at this point with medication pain management and physical therapy as well as surgery and injections.  She is currently in the process of getting prespinal cord stimulator trial psychological evaluation testing performed.  She has had MRI of the thoracic spine which is reviewed below which is normal.  This was done in process of looking at stimulators.  She reports no new symptoms other than just increased severe pain with relief following standing in a forward flexed position.  Pain is worse with sitting and standing.  She denies any specific focal weakness or red flag complaints.  Her case is complicated by multiple drug intolerances and obesity.  Review of Systems  Musculoskeletal: Positive for back pain and joint pain.  All other systems reviewed and are negative.  Otherwise per HPI.  Assessment & Plan: Visit Diagnoses:  1. Lumbar radiculopathy   2. Post laminectomy syndrome   3. Chronic pain syndrome     Plan: Findings:  Chronic worsening severe low back pain with bilateral hip and leg pain with history of lumbar fusion and really recalcitrant pain to pain medication which is complicated by intolerances to certain pain medications as well as physical therapy and injection treatment.  At this point we will have the psychological testing documents forwarded as well as the thoracic MRI report to Dr. Davy Pique at [facility] for approval and hopefully trial and implantation of spinal cord stimulator.  This really does represent a last chance effort and procedural response to her chronic severe pain.    Meds & Orders: No orders of the defined types were placed in this encounter.  No orders of the defined types were placed in this encounter.   Follow-up: Return if symptoms worsen or fail to improve.   Procedures: No procedures performed  No notes on file   Clinical History: 06/08/20 NCV & EMG Findings: Extensive electrodiagnostic testing of the left lower extremity shows: 1. Left sural and superficial peroneal sensory responses are within normal limits. 2. Left peroneal and tibial motor responses are within normal limits. 3. Chronic motor axonal loss changes are seen affecting the left rectus femoris and abductor longus muscles, without accompanying active denervation.  Impression: 1. Chronic L3-4 radiculopathy affecting the left lower extremity, mild. 2. There is no evidence of a sensorimotor polyneuropathy affecting the left lower extremity.   ___________________________ Vanessa Amber, DO ---------  MRI LUMBAR SPINE WITHOUT AND WITH CONTRAST    TECHNIQUE:  Multiplanar and multiecho pulse sequences of the lumbar spine were  obtained without and with intravenous contrast.    CONTRAST: 38mL MULTIHANCE GADOBENATE DIMEGLUMINE 529 MG/ML IV SOLN    COMPARISON: Prior CT from 01/29/2020 as well as MRI from  06/21/2019.    FINDINGS:  Segmentation: Standard.    Alignment: Physiologic. No interval listhesis.    Vertebrae: Susceptibility artifact related to prior PLIF at L2  through L5  again seen. Vertebral body height maintained without  acute or interval fracture. Bone marrow signal intensity within  normal limits. No discrete or worrisome osseous lesions. No findings  to suggest osteomyelitis discitis. No abnormal marrow edema or  enhancement.     Conus medullaris and cauda equina: Conus extends to the L1 level.  Conus and cauda equina appear normal.    Paraspinal and other soft tissues: Normal expected postoperative  changes present within the posterior paraspinous soft tissues. No  collections. Visualized visceral structures within normal limits.    Disc levels:    T12-L1: Unremarkable.    L1-2: Minimal disc bulge with disc desiccation. Superimposed chronic  endplate Schmorl's node deformities. Mild to moderate facet  hypertrophy. No significant spinal stenosis. Foramina remain patent.  No impingement.    L2-3: Sequelae of prior PLIF with left facetectomy and foraminotomy.  No residual spinal stenosis. Foramina appear patent.    L3-4: Sequelae of prior PLIF with left facetectomy and foraminotomy.  No residual spinal stenosis. Left neural foramen appears patent.  Residual disc bulging at the right neural foramen without  significant foraminal stenosis.    L4-5: Sequelae of prior PLIF with left facetectomy and foraminotomy.  No residual spinal stenosis. No residual spinal stenosis. Residual  left-sided endplate spurring without significant narrowing of the  left neural foramen. Right neural foramen remains widely patent.    L5-S1: Normal interspace. Mild facet hypertrophy. No canal or  foraminal stenosis.    IMPRESSION:  1. Sequelae of prior PLIF at L2 through L5 with left-sided  facetectomy and foraminotomy. No significant residual canal or  foraminal stenosis or adverse features.  2. Mild to moderate facet hypertrophy at L1-2 without significant  stenosis.      Electronically Signed  By: Jeannine Boga M.D.  On: 05/16/2020 00:20   She reports that she has never smoked. She has never used smokeless tobacco. No results for input(s): HGBA1C, LABURIC in the last 8760 hours.  Objective:  VS:  HT:    WT:   BMI:     BP:98/65  HR:67bpm  TEMP: ( )  RESP:  Physical Exam Constitutional:       General: She is not in acute distress.    Appearance: Normal appearance. She is obese. She is not ill-appearing.  HENT:     Head: Normocephalic and atraumatic.     Right Ear: External ear normal.     Left Ear: External ear normal.  Eyes:     Extraocular Movements: Extraocular movements intact.  Cardiovascular:     Rate and Rhythm: Normal rate.     Pulses: Normal pulses.  Musculoskeletal:     Right lower leg: No edema.     Left lower leg: No edema.     Comments: Patient has good distal strength with no pain over the greater trochanters.  No clonus or focal weakness.  Skin:    Findings: No erythema, lesion or rash.  Neurological:     General: No focal deficit present.     Mental Status: She is alert and oriented to person, place, and time.     Sensory: No sensory deficit.     Motor: No weakness or abnormal muscle tone.     Coordination: Coordination normal.  Psychiatric:        Mood and Affect: Mood normal.        Behavior: Behavior normal.     Ortho Exam  Imaging: No results found.  Past Medical/Family/Surgical/Social History: Medications & Allergies reviewed per EMR,  new medications updated. Patient Active Problem List   Diagnosis Date Noted  . DDD (degenerative disc disease), lumbar 09/02/2019    Class: Chronic  . Degenerative disc disease, lumbar 09/02/2019  . Chest tightness 09/23/2018  . Bradycardia 09/23/2018  . Labile blood pressure 03/08/2018  . Chronic low back pain 09/03/2017  . History of total hip replacement, left 01/26/2016  . EDEMA 04/28/2008  . LEG PAIN, BILATERAL 12/16/2007  . CERVICAL CANCER 07/02/2007  . MORBID OBESITY 07/02/2007  . DEPRESSION 07/02/2007  . COMMON MIGRAINE 07/02/2007  . ALLERGIC RHINITIS 07/02/2007  . ASTHMA 07/02/2007  . GERD 07/02/2007  . ELEVATED BLOOD PRESSURE WITHOUT DIAGNOSIS OF HYPERTENSION 07/02/2007   Past Medical History:  Diagnosis Date  . Anemia   . Cervical cancer ([place])   . Family history of adverse reaction  to anesthesia     " my mother takes a long time time wake up"  . GERD (gastroesophageal reflux disease)   . Headache    migraine  . History of shingles   . Hypertension   . Migraine   . Multiple allergies   . Obesity   . Osteoarthritis    left hip  . PONV (postoperative nausea and vomiting)   . Sleep apnea    does not wear CPAP  . Wears glasses    Family History  Problem Relation Age of Onset  . Renal Disease Mother   . Hypertension Mother   . Sudden Cardiac Death Mother   . Heart failure Mother   . Valvular heart disease Mother   . Heart disease Mother   . Stroke Brother   . Heart attack Brother 53  . Diabetes Other   . Breast cancer Cousin        paternal side   Past Surgical History:  Procedure Laterality Date  . ABDOMINAL HYSTERECTOMY    . APPENDECTOMY    . BILIOPANCREATIC DIVERSION     with duodenal switch laparoscopic   . CHOLECYSTECTOMY    . DIAGNOSTIC LAPAROSCOPY     LOA  . ESOPHAGOGASTRODUODENOSCOPY (EGD) WITH PROPOFOL N/A 07/25/2017   Procedure: ESOPHAGOGASTRODUODENOSCOPY (EGD) WITH PROPOFOL;  Surgeon: Manya Silvas, MD;  Location[facility] ENDOSCOPY;  Service: Endoscopy;  Laterality: N/A;  . KNEE ARTHROSCOPY    . LAPAROSCOPIC GASTRIC SLEEVE RESECTION WITH HIATAL HERNIA REPAIR    . TONSILLECTOMY    . TOTAL HIP ARTHROPLASTY Left 01/26/2016   Procedure: LEFT TOTAL HIP ARTHROPLASTY ANTERIOR APPROACH;  Surgeon: Leandrew Koyanagi, MD;  Location: [place];  Service: Orthopedics;  Laterality: Left;  . TRANSFORAMINAL LUMBAR INTERBODY FUSION (TLIF) WITH PEDICLE SCREW FIXATION 3 LEVEL  08/2019   Basil Dess, MD L2-L5   Social History   Occupational History  . Not on file  Tobacco Use  . Smoking status: Never Smoker  . Smokeless tobacco: Never Used  Vaping Use  . Vaping Use: Never used  Substance and Sexual Activity  . Alcohol use: Yes    Comment: occasional wine  . Drug use: No  . Sexual activity: Not on file

## 2020-06-25 ENCOUNTER — Ambulatory Visit
Admission: RE | Admit: 2020-06-25 | Discharge: 2020-06-25 | Disposition: A | Discharge status: Home / Self Care | Payer: 59 | Source: Ambulatory Visit | Attending: Obstetrics and Gynecology | Admitting: Obstetrics and Gynecology

## 2020-06-25 ENCOUNTER — Other Ambulatory Visit: Payer: Self-pay

## 2020-06-25 DIAGNOSIS — N6320 Unspecified lump in the left breast, unspecified quadrant: Secondary | ICD-10-CM

## 2020-06-27 ENCOUNTER — Encounter: Payer: Self-pay | Admitting: Specialist

## 2020-06-28 ENCOUNTER — Ambulatory Visit (INDEPENDENT_AMBULATORY_CARE_PROVIDER_SITE_OTHER): Payer: 59 | Admitting: Specialist

## 2020-06-28 ENCOUNTER — Other Ambulatory Visit: Payer: Self-pay

## 2020-06-28 ENCOUNTER — Encounter: Payer: Self-pay | Admitting: Specialist

## 2020-06-28 VITALS — BP 115/74 | HR 69 | Ht 64.0 in | Wt 260.0 lb

## 2020-06-28 DIAGNOSIS — R200 Anesthesia of skin: Secondary | ICD-10-CM

## 2020-06-28 DIAGNOSIS — R29898 Other symptoms and signs involving the musculoskeletal system: Secondary | ICD-10-CM

## 2020-06-28 DIAGNOSIS — Z981 Arthrodesis status: Secondary | ICD-10-CM

## 2020-06-28 DIAGNOSIS — M47814 Spondylosis without myelopathy or radiculopathy, thoracic region: Secondary | ICD-10-CM

## 2020-06-28 DIAGNOSIS — G8929 Other chronic pain: Secondary | ICD-10-CM

## 2020-06-28 DIAGNOSIS — M5440 Lumbago with sciatica, unspecified side: Secondary | ICD-10-CM

## 2020-06-28 DIAGNOSIS — G96198 Other disorders of meninges, not elsewhere classified: Secondary | ICD-10-CM

## 2020-06-28 MED ORDER — HYDROCODONE-ACETAMINOPHEN 5-325 MG PO TABS
1.0000 | ORAL_TABLET | Freq: Four times a day (QID) | ORAL | 0 refills | Status: DC | PRN
Start: 2020-06-28 — End: 2020-08-18

## 2020-06-28 NOTE — Progress Notes (Signed)
Office Visit Note   Patient: Vanessa Medina           Date of Birth: October 29, 1970           MRN: [id number] Visit Date: 06/28/2020              Requested by: Maryland Pink, MD [id number] [place],  [place] [address] PCP: Maryland Pink, MD   Assessment & Plan: Visit Diagnoses:  1. Epidural fibrosis   2. Weakness of left leg   3. Numbness in left leg   4. Chronic low back pain with sciatica, sciatica laterality unspecified, unspecified back pain laterality   5. S/P lumbar spinal fusion   6. Spondylosis of thoracic spine without myelopathy     Plan: Keep appointment for consideration of temporary SCS to determine if a permanent scs will be of benefit.  Follow-Up Instructions: No follow-ups on file.   Orders:  No orders of the defined types were placed in this encounter.  No orders of the defined types were placed in this encounter.     Procedures: No procedures performed   Clinical Data: No additional findings.   Subjective: Chief Complaint  Patient presents with  . Lower Back - Follow-up    49 year old female with history of sciatica post lumbar fusion TLIF left L3-4 and L4-5. She has sciatica and review of MRI is negative for significant nerve compression. The pain is constant, with numbness and tingling and present since time of surgery. No bowel or bladder disfunction. She is able to stand and ambulate but does notice numbness and burning quality pain into the left leg consistent with neuropathic pain. This is likely due to nerve changes from previous nerve compression or from surgery itself. She is scheduled for evaluation for a Spinal cord stimulator. Nothing that can be done today as she has not been evaluated by physiatry for consideration of the spinal cord  Stimulator. I did speak with her and explained the pre stimulator evaluation with involves both clinical exam and history as well as  Psychological testing.    Review of  Systems  Constitutional: Negative.   HENT: Negative.   Eyes: Negative.   Respiratory: Negative.   Cardiovascular: Negative.   Gastrointestinal: Negative.   Endocrine: Negative.   Genitourinary: Negative.   Musculoskeletal: Negative.   Skin: Negative.   Allergic/Immunologic: Negative.   Neurological: Negative.   Hematological: Negative.   Psychiatric/Behavioral: Negative.      Objective: Vital Signs: BP 115/74 (BP Location: Left Arm, Patient Position: Sitting)   Pulse 69   Ht 5\' 4"  (1.626 m)   Wt 260 lb (117.9 kg)   BMI 44.63 kg/m   Physical Exam Constitutional:      Appearance: She is well-developed and well-nourished.  HENT:     Head: Normocephalic and atraumatic.  Eyes:     Extraocular Movements: EOM normal.     Pupils: Pupils are equal, round, and reactive to light.  Pulmonary:     Effort: Pulmonary effort is normal.     Breath sounds: Normal breath sounds.  Abdominal:     General: Bowel sounds are normal.     Palpations: Abdomen is soft.  Musculoskeletal:     Cervical back: Normal range of motion and neck supple.     Lumbar back: Negative right straight leg raise test and negative left straight leg raise test.  Skin:    General: Skin is warm and dry.  Neurological:  Mental Status: She is alert and oriented to person, place, and time.  Psychiatric:        Mood and Affect: Mood and affect normal.        Behavior: Behavior normal.        Thought Content: Thought content normal.        Judgment: Judgment normal.     Back Exam   Tenderness  The patient is experiencing tenderness in the lumbar.  Range of Motion  Extension: abnormal  Flexion: abnormal  Lateral bend right: abnormal  Rotation right: abnormal  Rotation left: abnormal   Muscle Strength  Right Quadriceps:  5/5  Left Quadriceps:  5/5  Right Hamstrings:  5/5  Left Hamstrings:  5/5   Tests  Straight leg raise right: negative Straight leg raise left: negative  Comments:  No focal  motor deficit.       Specialty Comments:  No specialty comments available.  Imaging: No results found.   PMFS History: Patient Active Problem List   Diagnosis Date Noted  . DDD (degenerative disc disease), lumbar 09/02/2019    Priority: High    Class: Chronic  . Degenerative disc disease, lumbar 09/02/2019  . Chest tightness 09/23/2018  . Bradycardia 09/23/2018  . Labile blood pressure 03/08/2018  . Chronic low back pain 09/03/2017  . History of total hip replacement, left 01/26/2016  . EDEMA 04/28/2008  . LEG PAIN, BILATERAL 12/16/2007  . CERVICAL CANCER 07/02/2007  . MORBID OBESITY 07/02/2007  . DEPRESSION 07/02/2007  . COMMON MIGRAINE 07/02/2007  . ALLERGIC RHINITIS 07/02/2007  . ASTHMA 07/02/2007  . GERD 07/02/2007  . ELEVATED BLOOD PRESSURE WITHOUT DIAGNOSIS OF HYPERTENSION 07/02/2007   Past Medical History:  Diagnosis Date  . Anemia   . Cervical cancer ([place])   . Family history of adverse reaction to anesthesia     " my mother takes a long time time wake up"  . GERD (gastroesophageal reflux disease)   . Headache    migraine  . History of shingles   . Hypertension   . Migraine   . Multiple allergies   . Obesity   . Osteoarthritis    left hip  . PONV (postoperative nausea and vomiting)   . Sleep apnea    does not wear CPAP  . Wears glasses     Family History  Problem Relation Age of Onset  . Renal Disease Mother   . Hypertension Mother   . Sudden Cardiac Death Mother   . Heart failure Mother   . Valvular heart disease Mother   . Heart disease Mother   . Stroke Brother   . Heart attack Brother 79  . Diabetes Other   . Breast cancer Cousin        paternal side    Past Surgical History:  Procedure Laterality Date  . ABDOMINAL HYSTERECTOMY    . APPENDECTOMY    . BILIOPANCREATIC DIVERSION     with duodenal switch laparoscopic   . CHOLECYSTECTOMY    . DIAGNOSTIC LAPAROSCOPY     LOA  . ESOPHAGOGASTRODUODENOSCOPY (EGD) WITH PROPOFOL N/A  07/25/2017   Procedure: ESOPHAGOGASTRODUODENOSCOPY (EGD) WITH PROPOFOL;  Surgeon: Manya Silvas, MD;  Location: [facility] ENDOSCOPY;  Service: Endoscopy;  Laterality: N/A;  . KNEE ARTHROSCOPY    . LAPAROSCOPIC GASTRIC SLEEVE RESECTION WITH HIATAL HERNIA REPAIR    . TONSILLECTOMY    . TOTAL HIP ARTHROPLASTY Left 01/26/2016   Procedure: LEFT TOTAL HIP ARTHROPLASTY ANTERIOR APPROACH;  Surgeon: Leandrew Koyanagi, MD;  Location: [place];  Service: Orthopedics;  Laterality: Left;  . TRANSFORAMINAL LUMBAR INTERBODY FUSION (TLIF) WITH PEDICLE SCREW FIXATION 3 LEVEL  08/2019   Basil Dess, MD L2-L5   Social History   Occupational History  . Not on file  Tobacco Use  . Smoking status: Never Smoker  . Smokeless tobacco: Never Used  Vaping Use  . Vaping Use: Never used  Substance and Sexual Activity  . Alcohol use: Yes    Comment: occasional wine  . Drug use: No  . Sexual activity: Not on file

## 2020-06-30 ENCOUNTER — Other Ambulatory Visit: Payer: Self-pay | Admitting: Obstetrics and Gynecology

## 2020-06-30 DIAGNOSIS — N6320 Unspecified lump in the left breast, unspecified quadrant: Secondary | ICD-10-CM

## 2020-07-01 ENCOUNTER — Encounter: Payer: Self-pay | Admitting: Physical Medicine and Rehabilitation

## 2020-07-01 ENCOUNTER — Encounter: Payer: Self-pay | Admitting: Specialist

## 2020-07-01 ENCOUNTER — Telehealth: Payer: Self-pay

## 2020-07-01 NOTE — Telephone Encounter (Signed)
patient called she is wondering if referral packet has been received, call back:[phone]

## 2020-07-01 NOTE — Telephone Encounter (Signed)
Has this been sent to Dr. Gean Quint at [facility] yet?

## 2020-07-01 NOTE — Telephone Encounter (Signed)
Yes it has

## 2020-07-02 ENCOUNTER — Encounter: Payer: Self-pay | Admitting: Radiology

## 2020-07-04 ENCOUNTER — Other Ambulatory Visit: Payer: Self-pay | Admitting: Specialist

## 2020-07-05 IMAGING — CT CT ABD-PELV W/O
2 of 4 series · 16 of 46 positions shown, 18 images · non-contrast
Comparison: None.

CLINICAL DATA: Abdominal pain, nausea/vomiting. Biliopancreatic
diversion with duodenal switch on August 30, 2018. History of
cervical cancer. Prior cholecystectomy, appendectomy, and
hysterectomy.

EXAM:
CT ABDOMEN AND PELVIS WITHOUT CONTRAST
TECHNIQUE: Multidetector CT imaging of the abdomen and pelvis was performed
following the standard protocol without IV contrast.

[Series 3: a/p w/o 5mm · axial · non-contrast · 0.98mm/px · z∈[+906,+1346]mm · 13 of 104 slices shown, 15 images]
[im 8/104  soft-tissue]
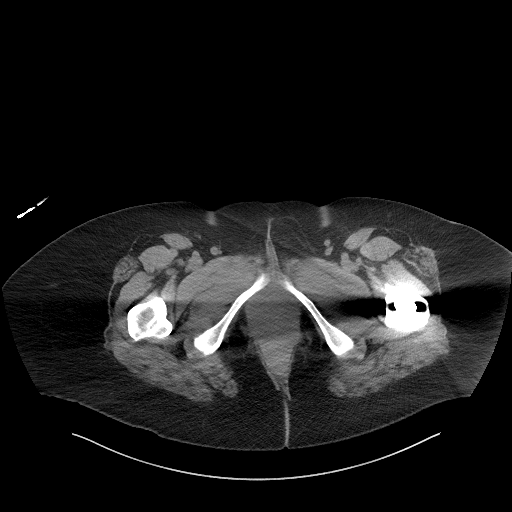
[im 8/104  bone]
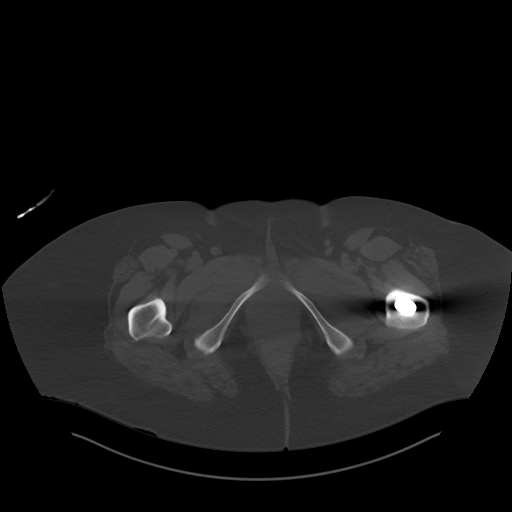
[im 15/104  soft-tissue]
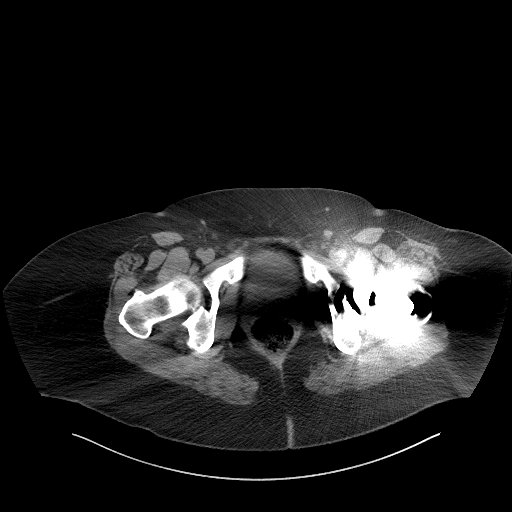
[im 23/104  soft-tissue]
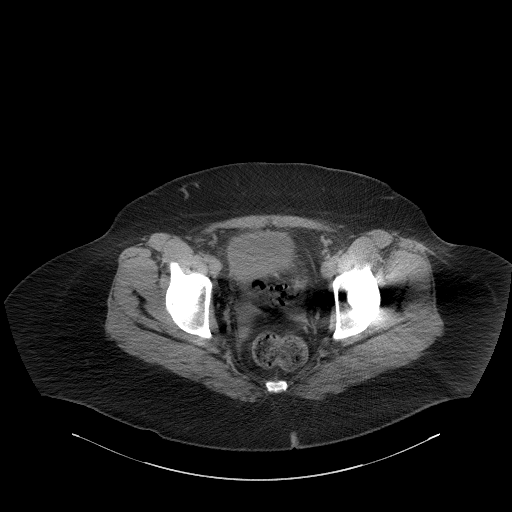
[im 30/104  soft-tissue]
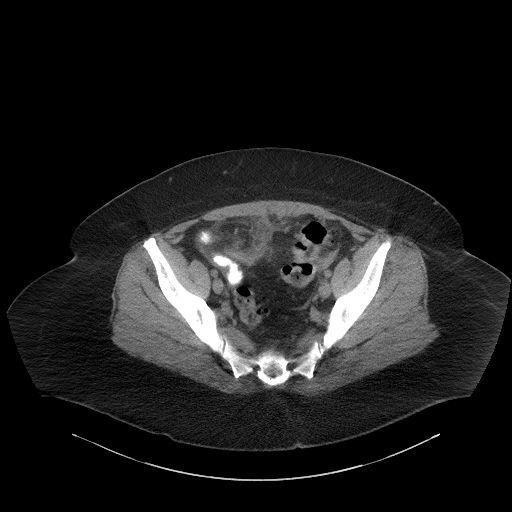
[im 37/104  soft-tissue]
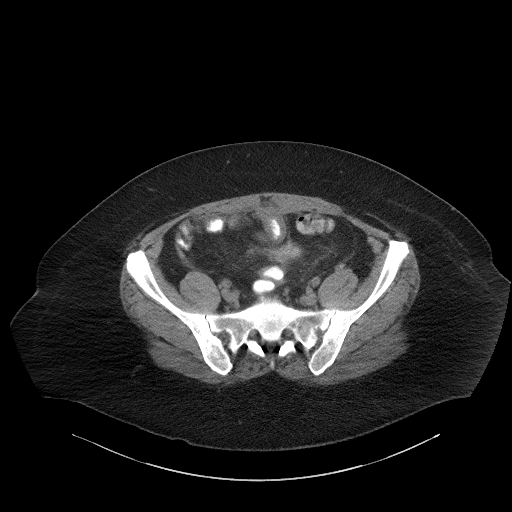
[im 45/104  soft-tissue]
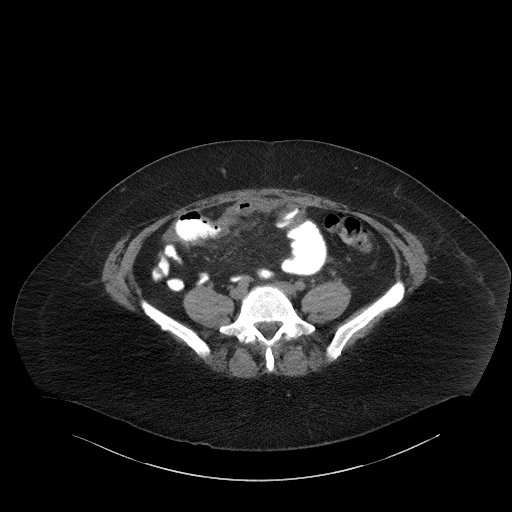
[im 52/104  soft-tissue]
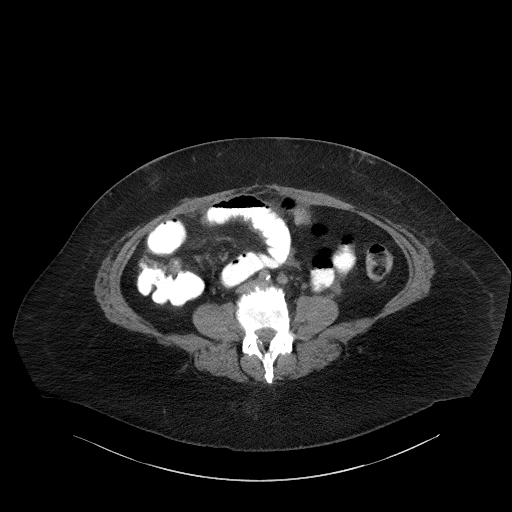
[im 59/104  soft-tissue]
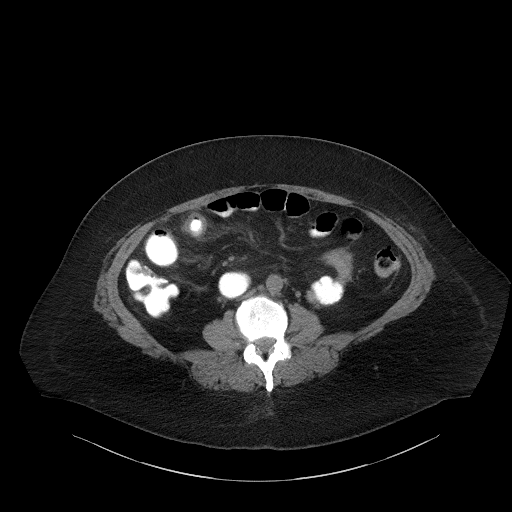
[im 67/104  soft-tissue]
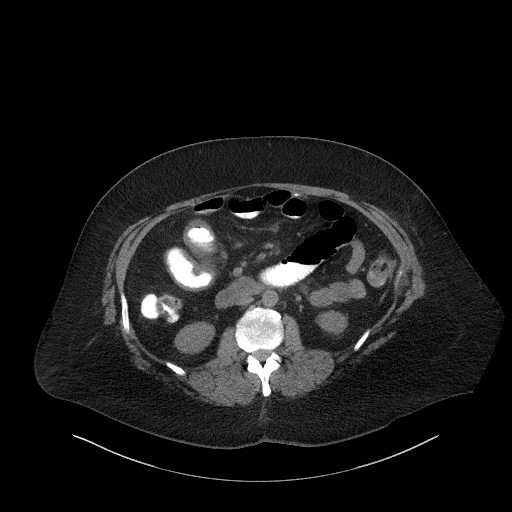
[im 67/104  bone]
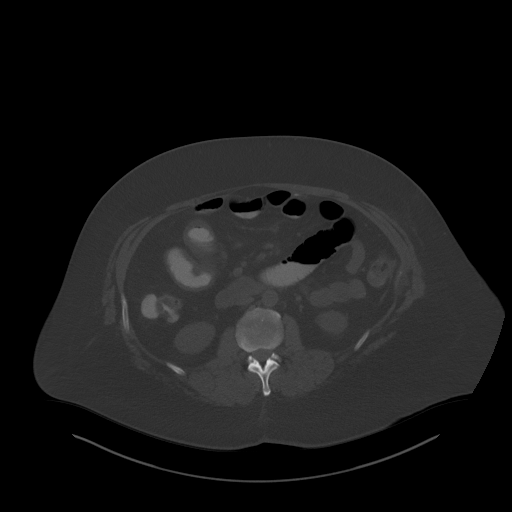
[im 74/104  soft-tissue]
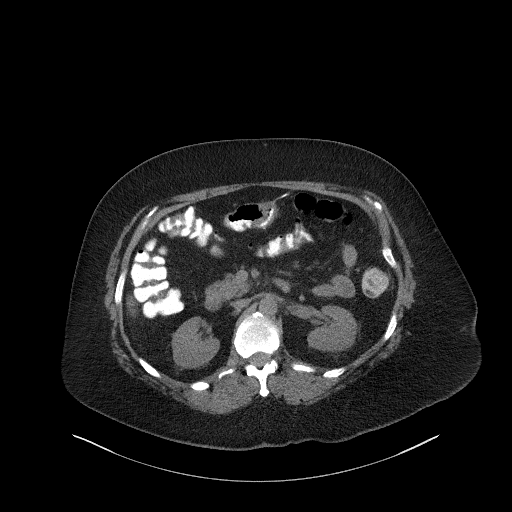
[im 81/104  soft-tissue]
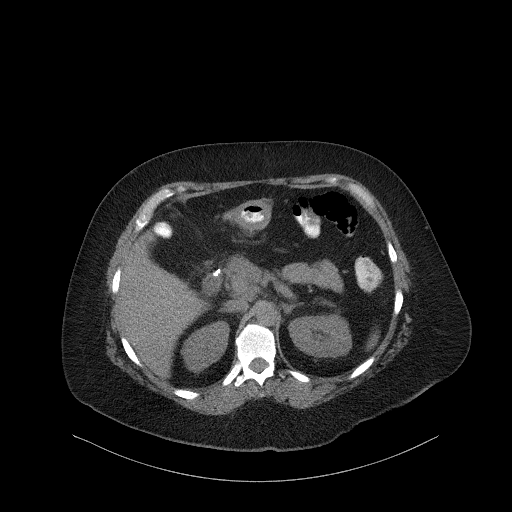
[im 89/104  soft-tissue]
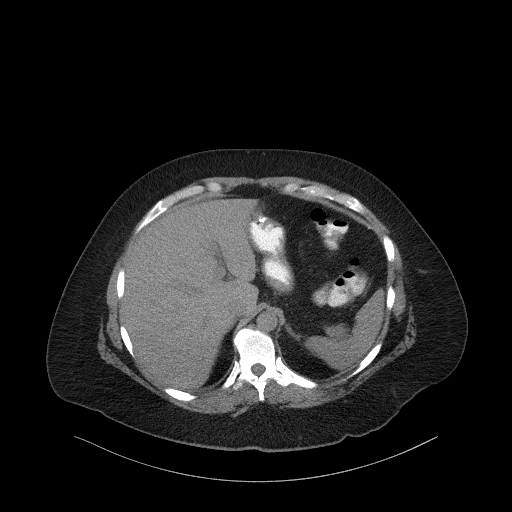
[im 96/104  soft-tissue]
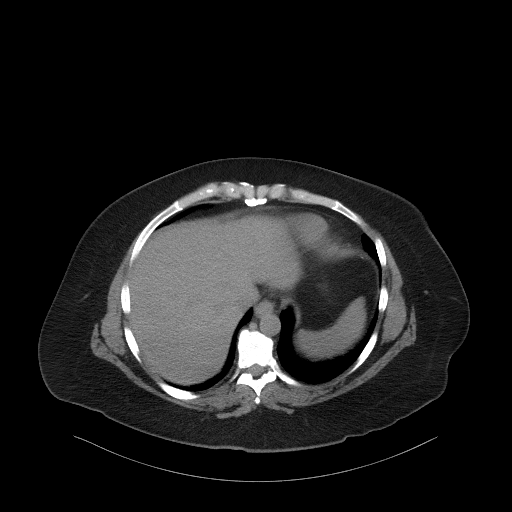

[Series 6: a/p w/o cor · coronal · non-contrast · 0.98mm/px · 3 of 133 slices shown]
[im 45/133  soft-tissue]
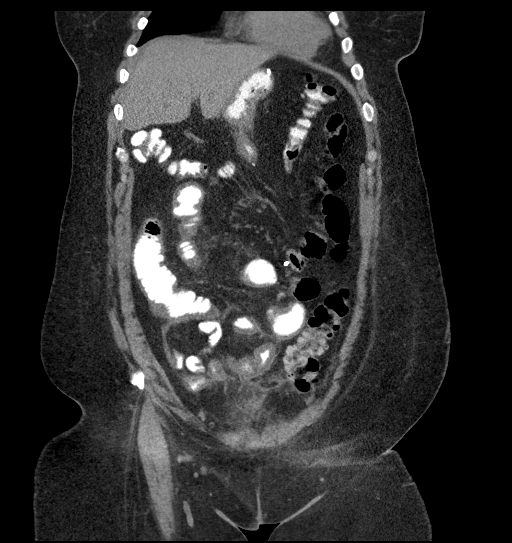
[im 59/133  soft-tissue]
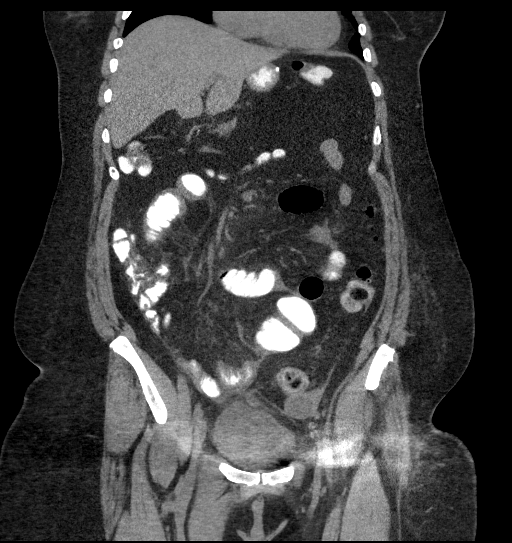
[im 74/133  soft-tissue]
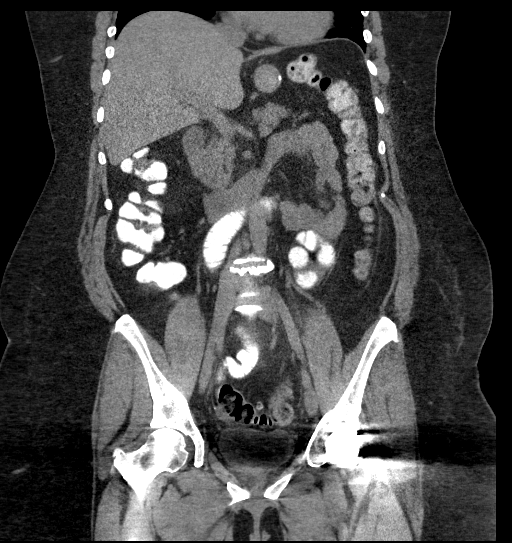

[16 of 46 positions shown; findings below may reference images not displayed]

FINDINGS: Lower chest: Lung bases are clear.

Hepatobiliary: Mild hepatic steatosis.

Status post cholecystectomy. No intrahepatic or extrahepatic ductal
dilatation.

Pancreas: Within normal limits.

Spleen: Within normal limits.

Adrenals/Urinary Tract: Adrenal glands are within normal limits.

Kidneys are within normal limits. No renal calculi or
hydronephrosis.

Bladder is within normal limits.

Stomach/Bowel: Status post sleeve gastrectomy with duodenal switch
procedure. Mild wall thickening/inflammatory changes involving the
distal stomach just proximal to the gastroenteric anastomosis
(series 3/image 23).

Wall thickening involving a mildly prominent loop of small bowel in
the right mid abdomen (series 3/image 36), suggesting
infectious/inflammatory enteritis. Additional wall thickening a with
inflammatory changes involving small bowel in the right lower
quadrant (series 3/image 71).

No evidence of bowel obstruction.

Prior appendectomy.

Colon is not decompressed.

Wall thickening involving

Vascular/Lymphatic: No evidence of abdominal aortic aneurysm.

No suspicious abdominopelvic lymphadenopathy.

Reproductive: Status post hysterectomy.

Left ovary is notable for a 3.4 cm cyst/follicle, possibly
hemorrhagic.

No right adnexal mass.

Other: Small volume pelvic ascites.

Musculoskeletal: Mild degenerative changes of the visualized
thoracolumbar spine.
IMPRESSION: Status post sleeve gastrectomy with duodenal switch procedure.

Mild wall thickening/inflammatory changes involving distal stomach
just proximal to the gastroenteric anastomosis. Wall
thickening/inflammatory changes involving loops of small bowel in
the right mid abdomen. These findings suggest
infectious/inflammatory gastroenteritis.

No evidence of bowel obstruction.

Prior cholecystectomy, appendectomy, and hysterectomy.

Additional ancillary findings as above.

## 2020-07-07 ENCOUNTER — Telehealth: Payer: Self-pay | Admitting: Specialist

## 2020-07-07 NOTE — Telephone Encounter (Signed)
[employer] forms received. Sent to [employer].

## 2020-07-12 ENCOUNTER — Telehealth: Payer: Self-pay | Admitting: Cardiovascular Disease

## 2020-07-12 NOTE — Telephone Encounter (Signed)
This encounter was created in error - please disregard.

## 2020-07-12 NOTE — Addendum Note (Signed)
Addended by: Governor Rooks F on: 07/12/2020 03:14 PM   Modules accepted: Level of Service, SmartSet

## 2020-07-12 NOTE — Telephone Encounter (Signed)
error 

## 2020-07-28 ENCOUNTER — Other Ambulatory Visit: Payer: Self-pay | Admitting: Nephrology

## 2020-07-28 DIAGNOSIS — N179 Acute kidney failure, unspecified: Secondary | ICD-10-CM

## 2020-08-01 NOTE — Progress Notes (Signed)
Date:  08/02/2020   ID:  Vanessa Medina, DOB [birth year], MRN [id number]  Patient Location:  [address] [place] [address]   Provider location:   [facility], [place] office  PCP:  Mary[place] Pink, MD  Cardiologist:  Arvid Right [facility]   Chief Complaint  Patient presents with  . Follow-up    2 Months follow up for BP. Medications verbally reviewed with patient.     History of Present Illness:    Vanessa Medina is a 49 y.o. female  past medical history of Nonsmoker No diabetes With history of hypertension, Morbid obesity History of gastric sleeve 3 years ago Who presents for follow-up of her labile blood pressure, hypotension and orthostasis  Recently seen by one of our providers June 01, 2020 On that visit he had dizziness, orthostasis, fatigue She is on midodrine 10mg   3 times daily, was having low blood pressures Chronic back pain, on Robaxin Fludrocortisone 0.1 was added  Fell yesterday, off stairs, mechanical fall Second fall last week, also mechanical fall Chronic back pain Dec 2020, back surgeryTried  spinal cord stimulator Going for implant in a few weeks, 12/10  Lost weight, "40 pounds" since 02/2020  Lab work reviewed CR 2.1,  BUN 36 Followed by Nephrology, GFR 27 Off celebrex Recheck tomorrow  BP labile PMD BP meds held months ago, but they keep sending in the mail  EKG personally reviewed by myself on todays visit Shows NSR rate 65 bpm  Other hx reviewed major surgery in December 2019 Told she had significant adhesions Several trips back to the hospital for various reasons including dehydration During her hospital visit September 05, 2018 and September 18, 2018 she reported heart rate was running low, medications were held  Since medications held heart rate typically running 50 up to 60, blood pressure stable but very occasionally with low heart rate.  One episode down to 40 in the setting of upper epigastric  pain  Cardiac studies  Echocardiogram August 14, 2018 - Left ventricle: The cavity size was normal. Wall thickness was increased in a pattern of mild LVH. Systolic function was normal. The estimated ejection fraction was in the range of 55% to 60%. Wall motion was normal; there were no regional wall motion abnormalities. Left ventricular diastolic function parameters were normal. - Right ventricle: The cavity size was normal. Wall thickness was normal. Systolic function was normal.   Prior CV studies:   The following studies were reviewed today:   Past Medical History:  Diagnosis Date  . Anemia   . Cervical cancer ([place])   . Family history of adverse reaction to anesthesia     " my mother takes a long time time wake up"  . GERD (gastroesophageal reflux disease)   . Headache    migraine  . History of shingles   . Hypertension   . Migraine   . Multiple allergies   . Obesity   . Osteoarthritis    left hip  . PONV (postoperative nausea and vomiting)   . Sleep apnea    does not wear CPAP  . Wears glasses    Past Surgical History:  Procedure Laterality Date  . ABDOMINAL HYSTERECTOMY    . APPENDECTOMY    . BILIOPANCREATIC DIVERSION     with duodenal switch laparoscopic   . CHOLECYSTECTOMY    . DIAGNOSTIC LAPAROSCOPY     LOA  . ESOPHAGOGASTRODUODENOSCOPY (EGD) WITH PROPOFOL N/A 07/25/2017   Procedure:  ESOPHAGOGASTRODUODENOSCOPY (EGD) WITH PROPOFOL;  Surgeon: Manya Silvas, MD;  Location: [facility] ENDOSCOPY;  Service: Endoscopy;  Laterality: N/A;  . KNEE ARTHROSCOPY    . LAPAROSCOPIC GASTRIC SLEEVE RESECTION WITH HIATAL HERNIA REPAIR    . TONSILLECTOMY    . TOTAL HIP ARTHROPLASTY Left 01/26/2016   Procedure: LEFT TOTAL HIP ARTHROPLASTY ANTERIOR APPROACH;  Surgeon: Leandrew Koyanagi, MD;  Location: [place];  Service: Orthopedics;  Laterality: Left;  . TRANSFORAMINAL LUMBAR INTERBODY FUSION (TLIF) WITH PEDICLE SCREW FIXATION 3 LEVEL  08/2019   Basil Dess, MD L2-L5      Current Meds  Medication Sig  . fludrocortisone (FLORINEF) 0.1 MG tablet Take 1 tablet (0.1 mg total) by mouth daily.  Marland Kitchen HYDROcodone-acetaminophen (NORCO/VICODIN) 5-325 MG tablet Take 1 tablet by mouth every 6 (six) hours as needed for moderate pain.  . Lifitegrast (XIIDRA) 5 % SOLN INSTILL 1 DROP IN BOTH EYES TWICE DAILY APPROXIMATELY 12 HOURS APART  . midodrine (PROAMATINE) 10 MG tablet Take 1 tablet (10 mg total) by mouth 3 (three) times daily.  . pramipexole (MIRAPEX) 0.125 MG tablet Take 0.5 mg by mouth at bedtime. Pt takes 4 tablets at bedtime  . topiramate (TOPAMAX) 25 MG tablet Take 25 mg by mouth 2 (two) times daily.     Allergies:   Shellfish allergy, Meperidine hcl, Morphine, Bee pollen, Ivp dye [iodinated diagnostic agents], Codeine, Oxycodone-acetaminophen, Pentazocine lactate, and Propoxyphene n-acetaminophen   Social History   Tobacco Use  . Smoking status: Never Smoker  . Smokeless tobacco: Never Used  Vaping Use  . Vaping Use: Never used  Substance Use Topics  . Alcohol use: Yes    Comment: occasional wine  . Drug use: No     Family Hx: The patient's family history includes Breast cancer in her cousin; Diabetes in an other family member; Heart attack (age of onset: 53) in her brother; Heart disease in her mother; Heart failure in her mother; Hypertension in her mother; Renal Disease in her mother; Stroke in her brother; Sudden Cardiac Death in her mother; Valvular heart disease in her mother.  ROS:   Please see the history of present illness.    Review of Systems  Constitutional: Negative.   HENT: Negative.   Respiratory: Negative.   Cardiovascular: Negative.   Gastrointestinal: Negative.   Musculoskeletal: Positive for back pain.  Neurological: Negative.   Psychiatric/Behavioral: Negative.   All other systems reviewed and are negative.   Labs/Other Tests and Data Reviewed:    Recent Labs: 08/22/2019: ALT 24 09/06/2019: BUN 10; Creatinine, Ser 1.05;  Hemoglobin 9.0; Platelets 249; Potassium 3.6; Sodium 139   Recent Lipid Panel No results found for: CHOL, TRIG, HDL, CHOLHDL, LDLCALC, LDLDIRECT  Wt Readings from Last 3 Encounters:  08/02/20 241 lb (109.3 kg)  06/28/20 260 lb (117.9 kg)  06/01/20 263 lb (119.3 kg)     Exam:    Vital Signs: Vital signs may also be detailed in the HPI BP 102/68 (BP Location: Left Arm, Patient Position: Sitting, Cuff Size: Large)   Pulse 65   Ht 5\' 4"  (1.626 m)   Wt 241 lb (109.3 kg)   SpO2 97%   BMI 41.37 kg/m   Constitutional:  oriented to person, place, and time. No distress.  Obese HENT:  Head: Grossly normal Eyes:  no discharge. No scleral icterus.  Neck: No JVD, no carotid bruits  Cardiovascular: Regular rate and rhythm, no murmurs appreciated Pulmonary/Chest: Clear to auscultation bilaterally, no wheezes or rails Abdominal: Soft.  no  distension.  no tenderness.  Musculoskeletal: Normal range of motion Neurological:  normal muscle tone. Coordination normal. No atrophy Skin: Skin warm and dry Psychiatric: normal affect, pleasant   ASSESSMENT & PLAN:    Labile blood pressure -  Midodrine 10 mg tid and florinef 0.1 daily Bp low but stable Recent drop in weight over the past 6 months likely resulting in drop in pressure leading to need for Florinef  MORBID OBESITY  Weight down, intentional Recommend she moderate any further weight loss given hypotension  H/o gastric sleeve Hard to hydrate Recommend she continue aggressive fluid resuscitation  Acute on chronic renal failure NSAIDS held, Stay hydrated Followed by nephrology, repeat labs scheduled this week  Vertigo Rare epsiodes  Gait instability/chronic back pain We will likely do better with spinal cord stimulator   Total encounter time more than 25 minutes  Greater than 50% was spent in counseling and coordination of care with the patient    Signed, Ida Rogue, MD  08/02/2020 3:35 PM    [place] [facility] [address], [place], [place] [address]

## 2020-08-02 ENCOUNTER — Ambulatory Visit (INDEPENDENT_AMBULATORY_CARE_PROVIDER_SITE_OTHER): Payer: 59 | Admitting: Cardiovascular Disease

## 2020-08-02 ENCOUNTER — Encounter: Payer: Self-pay | Admitting: Cardiovascular Disease

## 2020-08-02 ENCOUNTER — Other Ambulatory Visit: Payer: Self-pay

## 2020-08-02 VITALS — BP 102/68 | HR 65 | Ht 64.0 in | Wt 241.0 lb

## 2020-08-02 DIAGNOSIS — R0789 Other chest pain: Secondary | ICD-10-CM

## 2020-08-02 DIAGNOSIS — R0989 Other specified symptoms and signs involving the circulatory and respiratory systems: Secondary | ICD-10-CM

## 2020-08-02 DIAGNOSIS — I959 Hypotension, unspecified: Secondary | ICD-10-CM | POA: Diagnosis not present

## 2020-08-02 DIAGNOSIS — R002 Palpitations: Secondary | ICD-10-CM

## 2020-08-02 NOTE — Patient Instructions (Signed)
Medication Instructions:  No changes  If you need a refill on your cardiac medications before your next appointment, please call your pharmacy.    Lab work: No new labs needed   If you have labs (blood work) drawn today and your tests are completely normal, you will receive your results only by: . MyChart Message (if you have MyChart) OR . A paper copy in the mail If you have any lab test that is abnormal or we need to change your treatment, we will call you to review the results.   Testing/Procedures: No new testing needed   Follow-Up: At [facility], you and your health needs are our priority.  As part of our continuing mission to provide you with exceptional heart care, we have created designated Provider Care Teams.  These Care Teams include your primary Cardiologist (physician) and Advanced Practice Providers (APPs -  Physician Assistants and Nurse Practitioners) who all work together to provide you with the care you need, when you need it.  . You will need a follow up appointment in 6 months with APP  . Providers on your designated Care Team:   . Christopher Berge, NP . Ryan Dunn, PA-C . Jacquelyn Visser, PA-C  Any Other Special Instructions Will Be Listed Below (If Applicable).  COVID-19 Vaccine Information can be found at: [email or web address] For questions related to vaccine distribution or appointments, please email [email or web address] or call [phone].     

## 2020-08-06 ENCOUNTER — Other Ambulatory Visit: Payer: Self-pay

## 2020-08-06 ENCOUNTER — Ambulatory Visit
Admission: RE | Admit: 2020-08-06 | Discharge: 2020-08-06 | Disposition: A | Discharge status: Home / Self Care | Payer: 59 | Source: Ambulatory Visit | Attending: Nephrology | Admitting: Nephrology

## 2020-08-06 DIAGNOSIS — N179 Acute kidney failure, unspecified: Secondary | ICD-10-CM | POA: Insufficient documentation

## 2020-08-10 ENCOUNTER — Other Ambulatory Visit: Payer: Self-pay | Admitting: Specialist

## 2020-08-10 ENCOUNTER — Telehealth: Payer: Self-pay

## 2020-08-10 NOTE — Telephone Encounter (Signed)
LTD form received from [place]. Sent to [employer].

## 2020-08-11 ENCOUNTER — Encounter (HOSPITAL_COMMUNITY): Payer: Self-pay | Admitting: Student

## 2020-08-11 ENCOUNTER — Emergency Department (HOSPITAL_COMMUNITY): Payer: 59

## 2020-08-11 ENCOUNTER — Observation Stay (HOSPITAL_COMMUNITY): Payer: 59

## 2020-08-11 ENCOUNTER — Inpatient Hospital Stay (HOSPITAL_COMMUNITY)
Admission: EM | Admit: 2020-08-11 | Discharge: 2020-08-18 | DRG: 091 | Disposition: A | Discharge status: Rehabilitation Facility | Payer: 59 | Attending: Neurological Surgery | Admitting: Neurological Surgery

## 2020-08-11 DIAGNOSIS — S24104A Unspecified injury at T11-T12 level of thoracic spinal cord, initial encounter: Secondary | ICD-10-CM | POA: Diagnosis present

## 2020-08-11 DIAGNOSIS — I9589 Other hypotension: Secondary | ICD-10-CM | POA: Diagnosis present

## 2020-08-11 DIAGNOSIS — I10 Essential (primary) hypertension: Secondary | ICD-10-CM | POA: Diagnosis present

## 2020-08-11 DIAGNOSIS — K5900 Constipation, unspecified: Secondary | ICD-10-CM | POA: Diagnosis present

## 2020-08-11 DIAGNOSIS — I639 Cerebral infarction, unspecified: Secondary | ICD-10-CM

## 2020-08-11 DIAGNOSIS — G9782 Other postprocedural complications and disorders of nervous system: Principal | ICD-10-CM | POA: Diagnosis present

## 2020-08-11 DIAGNOSIS — Z9884 Bariatric surgery status: Secondary | ICD-10-CM

## 2020-08-11 DIAGNOSIS — Z841 Family history of disorders of kidney and ureter: Secondary | ICD-10-CM

## 2020-08-11 DIAGNOSIS — I959 Hypotension, unspecified: Secondary | ICD-10-CM

## 2020-08-11 DIAGNOSIS — Z91030 Bee allergy status: Secondary | ICD-10-CM

## 2020-08-11 DIAGNOSIS — Z8249 Family history of ischemic heart disease and other diseases of the circulatory system: Secondary | ICD-10-CM

## 2020-08-11 DIAGNOSIS — G8220 Paraplegia, unspecified: Secondary | ICD-10-CM

## 2020-08-11 DIAGNOSIS — N179 Acute kidney failure, unspecified: Secondary | ICD-10-CM | POA: Diagnosis present

## 2020-08-11 DIAGNOSIS — N184 Chronic kidney disease, stage 4 (severe): Secondary | ICD-10-CM | POA: Diagnosis present

## 2020-08-11 DIAGNOSIS — Z4889 Encounter for other specified surgical aftercare: Secondary | ICD-10-CM

## 2020-08-11 DIAGNOSIS — Z888 Allergy status to other drugs, medicaments and biological substances status: Secondary | ICD-10-CM

## 2020-08-11 DIAGNOSIS — Z8541 Personal history of malignant neoplasm of cervix uteri: Secondary | ICD-10-CM

## 2020-08-11 DIAGNOSIS — Y831 Surgical operation with implant of artificial internal device as the cause of abnormal reaction of the patient, or of later complication, without mention of misadventure at the time of the procedure: Secondary | ICD-10-CM | POA: Diagnosis present

## 2020-08-11 DIAGNOSIS — Z91013 Allergy to seafood: Secondary | ICD-10-CM

## 2020-08-11 DIAGNOSIS — Y753 Surgical instruments, materials and neurological devices (including sutures) associated with adverse incidents: Secondary | ICD-10-CM | POA: Diagnosis present

## 2020-08-11 DIAGNOSIS — K219 Gastro-esophageal reflux disease without esophagitis: Secondary | ICD-10-CM | POA: Diagnosis present

## 2020-08-11 DIAGNOSIS — G8918 Other acute postprocedural pain: Secondary | ICD-10-CM

## 2020-08-11 DIAGNOSIS — Z981 Arthrodesis status: Secondary | ICD-10-CM

## 2020-08-11 DIAGNOSIS — Z833 Family history of diabetes mellitus: Secondary | ICD-10-CM

## 2020-08-11 DIAGNOSIS — Z96642 Presence of left artificial hip joint: Secondary | ICD-10-CM | POA: Diagnosis present

## 2020-08-11 DIAGNOSIS — R29898 Other symptoms and signs involving the musculoskeletal system: Secondary | ICD-10-CM | POA: Diagnosis present

## 2020-08-11 DIAGNOSIS — Z803 Family history of malignant neoplasm of breast: Secondary | ICD-10-CM

## 2020-08-11 DIAGNOSIS — G9511 Acute infarction of spinal cord (embolic) (nonembolic): Secondary | ICD-10-CM

## 2020-08-11 DIAGNOSIS — Z79899 Other long term (current) drug therapy: Secondary | ICD-10-CM

## 2020-08-11 DIAGNOSIS — Z885 Allergy status to narcotic agent status: Secondary | ICD-10-CM

## 2020-08-11 DIAGNOSIS — M79604 Pain in right leg: Secondary | ICD-10-CM | POA: Diagnosis present

## 2020-08-11 DIAGNOSIS — K5901 Slow transit constipation: Secondary | ICD-10-CM

## 2020-08-11 DIAGNOSIS — J45909 Unspecified asthma, uncomplicated: Secondary | ICD-10-CM | POA: Diagnosis present

## 2020-08-11 DIAGNOSIS — Z823 Family history of stroke: Secondary | ICD-10-CM

## 2020-08-11 DIAGNOSIS — Z20822 Contact with and (suspected) exposure to covid-19: Secondary | ICD-10-CM | POA: Diagnosis present

## 2020-08-11 DIAGNOSIS — Z6841 Body Mass Index (BMI) 40.0 and over, adult: Secondary | ICD-10-CM

## 2020-08-11 HISTORY — DX: Cerebral infarction, unspecified: I63.9

## 2020-08-11 HISTORY — DX: Acute infarction of spinal cord (embolic) (nonembolic): G95.11

## 2020-08-11 LAB — I-STAT CHEM 8, ED
BUN: 44 mg/dL — ABNORMAL HIGH (ref 6–20)
Calcium, Ion: 1.06 mmol/L — ABNORMAL LOW (ref 1.15–1.40)
Chloride: 110 mmol/L (ref 98–111)
Creatinine, Ser: 3.4 mg/dL — ABNORMAL HIGH (ref 0.44–1.00)
Glucose, Bld: 110 mg/dL — ABNORMAL HIGH (ref 70–99)
HCT: 37 % (ref 36.0–46.0)
Hemoglobin: 12.6 g/dL (ref 12.0–15.0)
Potassium: 3.8 mmol/L (ref 3.5–5.1)
Sodium: 138 mmol/L (ref 135–145)
TCO2: 18 mmol/L — ABNORMAL LOW (ref 22–32)

## 2020-08-11 LAB — BASIC METABOLIC PANEL
Anion gap: 13 (ref 5–15)
BUN: 43 mg/dL — ABNORMAL HIGH (ref 6–20)
CO2: 19 mmol/L — ABNORMAL LOW (ref 22–32)
Calcium: 9.4 mg/dL (ref 8.9–10.3)
Chloride: 104 mmol/L (ref 98–111)
Creatinine, Ser: 3.2 mg/dL — ABNORMAL HIGH (ref 0.44–1.00)
GFR, Estimated: 17 mL/min — ABNORMAL LOW (ref >60)
Glucose, Bld: 110 mg/dL — ABNORMAL HIGH (ref 70–99)
Potassium: 3.7 mmol/L (ref 3.5–5.1)
Sodium: 136 mmol/L (ref 135–145)

## 2020-08-11 LAB — CBC WITH DIFFERENTIAL/PLATELET
Abs Immature Granulocytes: 0.01 10*3/uL (ref 0.00–0.07)
Basophils Absolute: 0 10*3/uL (ref 0.0–0.1)
Basophils Relative: 0 %
Eosinophils Absolute: 0 10*3/uL (ref 0.0–0.5)
Eosinophils Relative: 0 %
HCT: 37.6 % (ref 36.0–46.0)
Hemoglobin: 12.7 g/dL (ref 12.0–15.0)
Immature Granulocytes: 0 %
Lymphocytes Relative: 8 %
Lymphs Abs: 0.4 10*3/uL — ABNORMAL LOW (ref 0.7–4.0)
MCH: 33.6 pg (ref 26.0–34.0)
MCHC: 33.8 g/dL (ref 30.0–36.0)
MCV: 99.5 fL (ref 80.0–100.0)
Monocytes Absolute: 0.1 10*3/uL (ref 0.1–1.0)
Monocytes Relative: 2 %
Neutro Abs: 4.4 10*3/uL (ref 1.7–7.7)
Neutrophils Relative %: 90 %
Platelets: 212 10*3/uL (ref 150–400)
RBC: 3.78 MIL/uL — ABNORMAL LOW (ref 3.87–5.11)
RDW: 12.1 % (ref 11.5–15.5)
WBC: 4.9 10*3/uL (ref 4.0–10.5)
nRBC: 0 % (ref 0.0–0.2)

## 2020-08-11 MED ORDER — FENTANYL CITRATE (PF) 100 MCG/2ML IJ SOLN: 50.0000 ug | INTRAMUSCULAR | Status: DC | PRN

## 2020-08-11 MED ORDER — SODIUM CHLORIDE 0.9 % IV BOLUS
1000.0000 mL | Freq: Once | INTRAVENOUS | Status: AC
  Administered 2020-08-11: 1000 mL via INTRAVENOUS

## 2020-08-11 MED ORDER — DEXAMETHASONE SODIUM PHOSPHATE 10 MG/ML IJ SOLN
10.0000 mg | Freq: Once | INTRAMUSCULAR | Status: AC
  Administered 2020-08-11: 10 mg via INTRAVENOUS
  Filled 2020-08-11: qty 1

## 2020-08-11 MED ORDER — LORAZEPAM 2 MG/ML IJ SOLN
1.0000 mg | Freq: Once | INTRAMUSCULAR | Status: AC
  Administered 2020-08-11: 1 mg via INTRAVENOUS
  Filled 2020-08-11: qty 1

## 2020-08-11 MED ORDER — HYDROMORPHONE HCL 1 MG/ML IJ SOLN
INTRAMUSCULAR | Status: AC
  Filled 2020-08-11: qty 1

## 2020-08-11 MED ORDER — HYDROMORPHONE HCL 1 MG/ML IJ SOLN
0.5000 mg | Freq: Once | INTRAMUSCULAR | Status: AC
  Administered 2020-08-11: 0.5 mg via INTRAVENOUS

## 2020-08-11 MED ORDER — FENTANYL CITRATE (PF) 100 MCG/2ML IJ SOLN: 100.0000 ug | INTRAMUSCULAR | Status: DC | PRN

## 2020-08-11 MED ORDER — GADOBUTROL 1 MMOL/ML IV SOLN
10.0000 mL | Freq: Once | INTRAVENOUS | Status: AC | PRN
  Administered 2020-08-11: 10 mL via INTRAVENOUS

## 2020-08-11 MED ORDER — HYDROMORPHONE HCL 1 MG/ML IJ SOLN
1.0000 mg | Freq: Once | INTRAMUSCULAR | Status: AC
  Administered 2020-08-12: 1 mg via INTRAVENOUS
  Filled 2020-08-11: qty 1

## 2020-08-11 NOTE — ED Provider Notes (Signed)
[facility] EMERGENCY DEPARTMENT Provider Note   CSN: [id number] Arrival date & time: 08/11/20  1819     History Chief Complaint  Patient presents with  . Post-op Problem    Vanessa Medina is a 49 y.o. female with a past medical history significant for anemia, cervical cancer, GERD, hypertension who presents to the ED via EMS from the surgical center.  Patient was having a spinal stimulator placed and was sent to the ED to rule out CSF leak. Patient admits to severe 10/10 RLE pain, worse with movement. Pain associated with numbness/tingling of RLE. Patient was given Dilaudid, fentanyl, and Zofran prior to arrival with no improvement in symptoms. Patient also notes difficulty moving RLE due to pain.   History obtained from patient and past medical records. No interpreter used during encounter.      Past Medical History:  Diagnosis Date  . Anemia   . Cervical cancer ([place])   . Family history of adverse reaction to anesthesia     " my mother takes a long time time wake up"  . GERD (gastroesophageal reflux disease)   . Headache    migraine  . History of shingles   . Hypertension   . Migraine   . Multiple allergies   . Obesity   . Osteoarthritis    left hip  . PONV (postoperative nausea and vomiting)   . Sleep apnea    does not wear CPAP  . Wears glasses     Patient Active Problem List   Diagnosis Date Noted  . DDD (degenerative disc disease), lumbar 09/02/2019    Class: Chronic  . Degenerative disc disease, lumbar 09/02/2019  . Chest tightness 09/23/2018  . Bradycardia 09/23/2018  . Labile blood pressure 03/08/2018  . Chronic low back pain 09/03/2017  . History of total hip replacement, left 01/26/2016  . EDEMA 04/28/2008  . LEG PAIN, BILATERAL 12/16/2007  . CERVICAL CANCER 07/02/2007  . MORBID OBESITY 07/02/2007  . DEPRESSION 07/02/2007  . COMMON MIGRAINE 07/02/2007  . ALLERGIC RHINITIS 07/02/2007  . ASTHMA 07/02/2007  . GERD 07/02/2007  .  ELEVATED BLOOD PRESSURE WITHOUT DIAGNOSIS OF HYPERTENSION 07/02/2007    Past Surgical History:  Procedure Laterality Date  . ABDOMINAL HYSTERECTOMY    . APPENDECTOMY    . BILIOPANCREATIC DIVERSION     with duodenal switch laparoscopic   . CHOLECYSTECTOMY    . DIAGNOSTIC LAPAROSCOPY     LOA  . ESOPHAGOGASTRODUODENOSCOPY (EGD) WITH PROPOFOL N/A 07/25/2017   Procedure: ESOPHAGOGASTRODUODENOSCOPY (EGD) WITH PROPOFOL;  Surgeon: Manya Silvas, MD;  [facility] ENDOSCOPY;  Service: Endoscopy;  Laterality: N/A;  . KNEE ARTHROSCOPY    . LAPAROSCOPIC GASTRIC SLEEVE RESECTION WITH HIATAL HERNIA REPAIR    . TONSILLECTOMY    . TOTAL HIP ARTHROPLASTY Left 01/26/2016   Procedure: LEFT TOTAL HIP ARTHROPLASTY ANTERIOR APPROACH;  Surgeon: Leandrew Koyanagi, MD;  Location: [place];  Service: Orthopedics;  Laterality: Left;  . TRANSFORAMINAL LUMBAR INTERBODY FUSION (TLIF) WITH PEDICLE SCREW FIXATION 3 LEVEL  08/2019   Basil Dess, MD L2-L5     OB History   No obstetric history on file.     Family History  Problem Relation Age of Onset  . Renal Disease Mother   . Hypertension Mother   . Sudden Cardiac Death Mother   . Heart failure Mother   . Valvular heart disease Mother   . Heart disease Mother   . Stroke Brother   . Heart attack Brother 76  .  Diabetes Other   . Breast cancer Cousin        paternal side    Social History   Tobacco Use  . Smoking status: Never Smoker  . Smokeless tobacco: Never Used  Vaping Use  . Vaping Use: Never used  Substance Use Topics  . Alcohol use: Yes    Comment: occasional wine  . Drug use: No    Home Medications Prior to Admission medications   Medication Sig Start Date End Date Taking? Authorizing Provider  celecoxib (CELEBREX) 200 MG capsule TAKE 1 CAPSULE BY MOUTH  TWICE DAILY 08/11/20   Jessy Oto, MD  fludrocortisone (FLORINEF) 0.1 MG tablet Take 1 tablet (0.1 mg total) by mouth daily. 06/01/20   Dunn, Areta Haber, PA-C  HYDROcodone-acetaminophen  (NORCO/VICODIN) 5-325 MG tablet Take 1 tablet by mouth every 6 (six) hours as needed for moderate pain. 06/28/20   Jessy Oto, MD  Lifitegrast Shirley Friar) 5 % SOLN INSTILL 1 DROP IN BOTH EYES TWICE DAILY APPROXIMATELY 12 HOURS APART 03/02/20   [provider]  midodrine (PROAMATINE) 10 MG tablet Take 1 tablet (10 mg total) by mouth 3 (three) times daily. 02/17/20   Minna Merritts, MD  pramipexole (MIRAPEX) 0.125 MG tablet Take 0.5 mg by mouth at bedtime. Pt takes 4 tablets at bedtime    [provider]  topiramate (TOPAMAX) 25 MG tablet Take 25 mg by mouth 2 (two) times daily.    [provider]    Allergies    Shellfish allergy, Meperidine hcl, Morphine, Bee pollen, Ivp dye [iodinated diagnostic agents], Codeine, Oxycodone-acetaminophen, Pentazocine lactate, and Propoxyphene n-acetaminophen  Review of Systems   Review of Systems  Constitutional: Negative for chills and fever.  Respiratory: Negative for shortness of breath.   Cardiovascular: Negative for chest pain.  Gastrointestinal: Negative for abdominal pain.  Musculoskeletal: Positive for arthralgias, back pain, gait problem and myalgias.  Neurological: Positive for numbness.  All other systems reviewed and are negative.   Physical Exam Updated Vital Signs BP 134/67   Pulse 71   Temp 97.9 F (36.6 C) (Oral)   Resp 14   SpO2 100%   Physical Exam Vitals and nursing note reviewed.  Constitutional:      General: She is not in acute distress.    Comments: Appears to be very uncomfortable in bed  HENT:     Head: Normocephalic.  Eyes:     Pupils: Pupils are equal, round, and reactive to light.  Cardiovascular:     Rate and Rhythm: Normal rate and regular rhythm.     Pulses: Normal pulses.     Heart sounds: Normal heart sounds. No murmur heard.  No friction rub. No gallop.   Pulmonary:     Effort: Pulmonary effort is normal.     Breath sounds: Normal breath sounds.  Abdominal:     General:  Abdomen is flat. Bowel sounds are normal. There is no distension.     Palpations: Abdomen is soft.     Tenderness: There is no abdominal tenderness. There is no guarding or rebound.  Musculoskeletal:     Cervical back: Neck supple.     Comments: Patient laying on back and unable to assess back due to severe pain. Decreased movement of RLE. Distal sensation and pulses intact in RLE.   Skin:    General: Skin is warm and dry.  Neurological:     General: No focal deficit present.     Mental Status: She is alert.  Psychiatric:        Mood and Affect: Mood normal.        Behavior: Behavior normal.     ED Results / Procedures / Treatments   Labs (all labs ordered are listed, but only abnormal results are displayed) Labs Reviewed  CBC WITH DIFFERENTIAL/PLATELET - Abnormal; Notable for the following components:      Result Value   RBC 3.78 (*)    Lymphs Abs 0.4 (*)    All other components within normal limits  I-STAT CHEM 8, ED - Abnormal; Notable for the following components:   BUN 44 (*)    Creatinine, Ser 3.40 (*)    Glucose, Bld 110 (*)    Calcium, Ion 1.06 (*)    TCO2 18 (*)    All other components within normal limits  BASIC METABOLIC PANEL    EKG None  Radiology No results found.  Procedures Procedures (including critical care time)  Medications Ordered in ED Medications  LORazepam (ATIVAN) injection 1 mg (has no administration in time range)  sodium chloride 0.9 % bolus 1,000 mL (has no administration in time range)  HYDROmorphone (DILAUDID) injection 0.5 mg (0.5 mg Intravenous Given 08/11/20 1854)  dexamethasone (DECADRON) injection 10 mg (10 mg Intravenous Given 08/11/20 2108)    ED Course  I have reviewed the triage vital signs and the nursing notes.  Pertinent labs & imaging results that were available during my care of the patient were reviewed by me and considered in my medical decision making (see chart for details).  Clinical Course as of Aug 11 2132   Wed Aug 11, 2020  2116 Creatinine(!): 3.40 [CA]    Clinical Course User Index [CA] Suzy Bouchard, PA-C   MDM Rules/Calculators/A&P                         49 year old female presents to the ED for surgical center to rule CSF leak after attempt to place spinal stimulator. Upon arrival, stable vitals.  Patient in no acute distress however appears extremely uncomfortable in bed.  Physical exam significant for severe RLE tenderness throughout, pain worse with movement. Sensation and pulses intact. Unable to assess low back due to severe pain with movement.  Will obtain routine labs and MRI to evaluate for CSF leak.  Dilaudid and Ativan given in the ED for symptomatic relief.  Decadron given per neurosurgery NP request. Discussed patient with Dr. Darl Householder who agrees with assessment and plan.   CBC reassuring with no leukocytosis and normal hemoglobin.  Chem-8 significant for elevated creatinine at 3.4 and BUN at 44. Patient notes she is currently being worked up with nephrology in regards to her worsening renal function. Discussed case with Joelene Millin neurosurgery NP who requests paging her when MRI results become available. Patient will most likely need admission to the hospital pending MRI results.   Patient handed off to Dr. Darl Householder pending MRI results.  Final Clinical Impression(s) / ED Diagnoses Final diagnoses:  Post-operative pain    Rx / DC Orders ED Discharge Orders    None       Karie Kirks 08/11/20 2136    Drenda Freeze, MD 08/11/20 [phone]

## 2020-08-11 NOTE — ED Notes (Signed)
Back to room at this time.

## 2020-08-11 NOTE — H&P (Signed)
Vanessa Medina is an 49 y.o. female.   HPI: 49 year old patient of Dr. Davy Pique was sent to [facility] emergency room after experiencing right leg weakness in the pacu at the surgical center. She was scheduled to have a spinal cord stimulator placed today. She reports that Dr. Davy Pique was unable to perform it because of the scar tissue. It was suspected that she a spinal fluid leak. The patient woke up in pacu with excrutiating pain down her right leg. Denies any numbness or tingling. States that she has been unable to move her right leg since she woke up from surgery. Has a history of multiple spine surgeries by Dr. Louanne Skye with her most recent on being one year ago. Denies any change in her bowel or bladder habits. Rates her pain a 10/10. She can barely stand a blanket touching her foot at this point.   Past Medical History:  Diagnosis Date  . Anemia   . Cervical cancer ([facility])   . Family history of adverse reaction to anesthesia     " my mother takes a long time time wake up"  . GERD (gastroesophageal reflux disease)   . Headache    migraine  . History of shingles   . Hypertension   . Migraine   . Multiple allergies   . Obesity   . Osteoarthritis    left hip  . PONV (postoperative nausea and vomiting)   . Sleep apnea    does not wear CPAP  . Wears glasses     Past Surgical History:  Procedure Laterality Date  . ABDOMINAL HYSTERECTOMY    . APPENDECTOMY    . BILIOPANCREATIC DIVERSION     with duodenal switch laparoscopic   . CHOLECYSTECTOMY    . DIAGNOSTIC LAPAROSCOPY     LOA  . ESOPHAGOGASTRODUODENOSCOPY (EGD) WITH PROPOFOL N/A 07/25/2017   Procedure: ESOPHAGOGASTRODUODENOSCOPY (EGD) WITH PROPOFOL;  Surgeon: Manya Silvas, MD;  Location: [facility] ENDOSCOPY;  Service: Endoscopy;  Laterality: N/A;  . KNEE ARTHROSCOPY    . LAPAROSCOPIC GASTRIC SLEEVE RESECTION WITH HIATAL HERNIA REPAIR    . TONSILLECTOMY    . TOTAL HIP ARTHROPLASTY Left 01/26/2016   Procedure: LEFT TOTAL HIP  ARTHROPLASTY ANTERIOR APPROACH;  Surgeon: Leandrew Koyanagi, MD;  Location: [facility];  Service: Orthopedics;  Laterality: Left;  . TRANSFORAMINAL LUMBAR INTERBODY FUSION (TLIF) WITH PEDICLE SCREW FIXATION 3 LEVEL  08/2019   Basil Dess, MD L2-L5    Allergies  Allergen Reactions  . Shellfish Allergy Anaphylaxis  . Meperidine Hcl Other (See Comments)    BRADYCARDIA  . Morphine Other (See Comments)    BRADYCARDIA  . Bee Pollen Other (See Comments)    Unknown  . Ivp Dye [Iodinated Diagnostic Agents] Other (See Comments)    Swelling   . Codeine Nausea And Vomiting  . Oxycodone-Acetaminophen Itching  . Pentazocine Lactate Nausea And Vomiting    REACTION: vomiting with Talwin NX  . Propoxyphene N-Acetaminophen Nausea And Vomiting    Social History   Tobacco Use  . Smoking status: Never Smoker  . Smokeless tobacco: Never Used  Substance Use Topics  . Alcohol use: Yes    Comment: occasional wine    Family History  Problem Relation Age of Onset  . Renal Disease Mother   . Hypertension Mother   . Sudden Cardiac Death Mother   . Heart failure Mother   . Valvular heart disease Mother   . Heart disease Mother   . Stroke Brother   .  Heart attack Brother 76  . Diabetes Other   . Breast cancer Cousin        paternal side     Review of Systems  Positive ROS: as above  All other systems have been reviewed and were otherwise negative with the exception of those mentioned in the HPI and as above.  Objective: Vital signs in last 24 hours: Temp:  [97.9 F (36.6 C)] 97.9 F (36.6 C) (12/08 1856) Pulse Rate:  [68-96] 92 (12/08 2145) Resp:  [14] 14 (12/08 1856) BP: (101-134)/(54-69) 112/59 (12/08 2145) SpO2:  [100 %] 100 % (12/08 2145)  General Appearance: Alert, cooperative, moderate distress, appears stated age Head: Normocephalic, without obvious abnormality, atraumatic Eyes: PERRL, conjunctiva/corneas clear, EOM's intact, fundi benign, both eyes      Back: Symmetric, no curvature,  ROM normal, no CVA tenderness Lungs:  respirations unlabored Heart: Regular rate and rhythm Pulses: 2+ and symmetric all extremities Skin: Skin color, texture, turgor normal, no rashes or lesions  NEUROLOGIC:   Mental status: A&O x4, no aphasia, good attention span, Memory and fund of knowledge Motor Exam - upper extremities 5/5, LLE 4+/5, RLE 1/5.  Sensory Exam - grossly normal Reflexes: symmetric, no pathologic reflexes, No Hoffman's, No clonus Coordination - grossly normal Gait - unable to test Balance - unable to test Cranial Nerves: I: smell Not tested  II: visual acuity  OS: na   OD: na  II: visual fields Full to confrontation  II: pupils Equal, round, reactive to light  III,VII: ptosis None  III,IV,VI: extraocular muscles  Full ROM  V: mastication   V: facial light touch sensation    V,VII: corneal reflex    VII: facial muscle function - upper    VII: facial muscle function - lower   VIII: hearing   IX: soft palate elevation    IX,X: gag reflex   XI: trapezius strength    XI: sternocleidomastoid strength   XI: neck flexion strength    XII: tongue strength      Data Review Lab Results  Component Value Date   WBC 4.9 08/11/2020   HGB 12.6 08/11/2020   HCT 37.0 08/11/2020   MCV 99.5 08/11/2020   PLT 212 08/11/2020   Lab Results  Component Value Date   NA 138 08/11/2020   K 3.8 08/11/2020   CL 110 08/11/2020   CO2 19 (L) 08/11/2020   BUN 44 (H) 08/11/2020   CREATININE 3.40 (H) 08/11/2020   GLUCOSE 110 (H) 08/11/2020   Lab Results  Component Value Date   INR 1.0 08/22/2019    Radiology: No results found.  Assessment/Plan: 49 year old patient presented to the ED  Tonight after waking up from surgery in pacu and unable to move her right leg with severe radicular pain. Awaiting mri t spine results. Gave decadron to help with pain.

## 2020-08-11 NOTE — ED Triage Notes (Signed)
Patient BIB [facility] from surgical center. Patient was having a spinal stimulator implanted, EMS reports surgical center nurse told them that the surgeon may have cut into the patient's dura and wanted to have an MRI to evaluate surgical site. Patient awoke from anesthesia in severe pain. Was given 0.5 mg dilaudid at surgical center, 161mcg fentanyl with EMS, and 4mg  zofran. 20g saline lock in left hand.

## 2020-08-11 NOTE — Progress Notes (Signed)
I have reviewed the images of the lumbar spine and thoracic spine.  There is no radiology report yet.  I do not see an epidural hematoma.  I do think that I see a small area of signal change in the right side of the cord at the distal T12 level at the conus.  This probably accounts for her leg weakness and pain.  She will be admitted and we will get physical and occupational therapy and treat her pain expectantly.  We will await the radiology report but I do not see anything surgical at this time.

## 2020-08-11 NOTE — ED Notes (Signed)
Patient transported to MRI 

## 2020-08-12 ENCOUNTER — Other Ambulatory Visit: Payer: Self-pay | Admitting: Pain Medicine

## 2020-08-12 DIAGNOSIS — Z833 Family history of diabetes mellitus: Secondary | ICD-10-CM | POA: Diagnosis not present

## 2020-08-12 DIAGNOSIS — S34109S Unspecified injury to unspecified level of lumbar spinal cord, sequela: Secondary | ICD-10-CM | POA: Diagnosis not present

## 2020-08-12 DIAGNOSIS — J45909 Unspecified asthma, uncomplicated: Secondary | ICD-10-CM | POA: Diagnosis present

## 2020-08-12 DIAGNOSIS — R29898 Other symptoms and signs involving the musculoskeletal system: Secondary | ICD-10-CM | POA: Diagnosis not present

## 2020-08-12 DIAGNOSIS — Z803 Family history of malignant neoplasm of breast: Secondary | ICD-10-CM | POA: Diagnosis not present

## 2020-08-12 DIAGNOSIS — Z20822 Contact with and (suspected) exposure to covid-19: Secondary | ICD-10-CM | POA: Diagnosis present

## 2020-08-12 DIAGNOSIS — E876 Hypokalemia: Secondary | ICD-10-CM | POA: Diagnosis not present

## 2020-08-12 DIAGNOSIS — Z885 Allergy status to narcotic agent status: Secondary | ICD-10-CM | POA: Diagnosis not present

## 2020-08-12 DIAGNOSIS — G903 Multi-system degeneration of the autonomic nervous system: Secondary | ICD-10-CM | POA: Diagnosis not present

## 2020-08-12 DIAGNOSIS — Z8541 Personal history of malignant neoplasm of cervix uteri: Secondary | ICD-10-CM | POA: Diagnosis not present

## 2020-08-12 DIAGNOSIS — Z8249 Family history of ischemic heart disease and other diseases of the circulatory system: Secondary | ICD-10-CM | POA: Diagnosis not present

## 2020-08-12 DIAGNOSIS — G43709 Chronic migraine without aura, not intractable, without status migrainosus: Secondary | ICD-10-CM | POA: Diagnosis not present

## 2020-08-12 DIAGNOSIS — I959 Hypotension, unspecified: Secondary | ICD-10-CM | POA: Diagnosis not present

## 2020-08-12 DIAGNOSIS — Z888 Allergy status to other drugs, medicaments and biological substances status: Secondary | ICD-10-CM | POA: Diagnosis not present

## 2020-08-12 DIAGNOSIS — K219 Gastro-esophageal reflux disease without esophagitis: Secondary | ICD-10-CM | POA: Diagnosis present

## 2020-08-12 DIAGNOSIS — Z981 Arthrodesis status: Secondary | ICD-10-CM | POA: Diagnosis not present

## 2020-08-12 DIAGNOSIS — K5901 Slow transit constipation: Secondary | ICD-10-CM | POA: Diagnosis not present

## 2020-08-12 DIAGNOSIS — G8918 Other acute postprocedural pain: Secondary | ICD-10-CM | POA: Diagnosis present

## 2020-08-12 DIAGNOSIS — R0989 Other specified symptoms and signs involving the circulatory and respiratory systems: Secondary | ICD-10-CM | POA: Diagnosis not present

## 2020-08-12 DIAGNOSIS — F4323 Adjustment disorder with mixed anxiety and depressed mood: Secondary | ICD-10-CM | POA: Diagnosis not present

## 2020-08-12 DIAGNOSIS — M79604 Pain in right leg: Secondary | ICD-10-CM | POA: Diagnosis present

## 2020-08-12 DIAGNOSIS — G9782 Other postprocedural complications and disorders of nervous system: Secondary | ICD-10-CM | POA: Diagnosis present

## 2020-08-12 DIAGNOSIS — Z6841 Body Mass Index (BMI) 40.0 and over, adult: Secondary | ICD-10-CM | POA: Diagnosis not present

## 2020-08-12 DIAGNOSIS — N179 Acute kidney failure, unspecified: Secondary | ICD-10-CM | POA: Diagnosis present

## 2020-08-12 DIAGNOSIS — S24104A Unspecified injury at T11-T12 level of thoracic spinal cord, initial encounter: Secondary | ICD-10-CM | POA: Diagnosis present

## 2020-08-12 DIAGNOSIS — Y753 Surgical instruments, materials and neurological devices (including sutures) associated with adverse incidents: Secondary | ICD-10-CM | POA: Diagnosis present

## 2020-08-12 DIAGNOSIS — I10 Essential (primary) hypertension: Secondary | ICD-10-CM | POA: Diagnosis present

## 2020-08-12 DIAGNOSIS — G8220 Paraplegia, unspecified: Secondary | ICD-10-CM | POA: Diagnosis not present

## 2020-08-12 DIAGNOSIS — Z91030 Bee allergy status: Secondary | ICD-10-CM | POA: Diagnosis not present

## 2020-08-12 DIAGNOSIS — R001 Bradycardia, unspecified: Secondary | ICD-10-CM | POA: Diagnosis not present

## 2020-08-12 DIAGNOSIS — I950 Idiopathic hypotension: Secondary | ICD-10-CM | POA: Diagnosis not present

## 2020-08-12 DIAGNOSIS — Z91013 Allergy to seafood: Secondary | ICD-10-CM | POA: Diagnosis not present

## 2020-08-12 DIAGNOSIS — Z79899 Other long term (current) drug therapy: Secondary | ICD-10-CM | POA: Diagnosis not present

## 2020-08-12 DIAGNOSIS — Y831 Surgical operation with implant of artificial internal device as the cause of abnormal reaction of the patient, or of later complication, without mention of misadventure at the time of the procedure: Secondary | ICD-10-CM | POA: Diagnosis present

## 2020-08-12 DIAGNOSIS — N184 Chronic kidney disease, stage 4 (severe): Secondary | ICD-10-CM | POA: Diagnosis present

## 2020-08-12 DIAGNOSIS — K5900 Constipation, unspecified: Secondary | ICD-10-CM | POA: Diagnosis present

## 2020-08-12 DIAGNOSIS — Z841 Family history of disorders of kidney and ureter: Secondary | ICD-10-CM | POA: Diagnosis not present

## 2020-08-12 DIAGNOSIS — Z823 Family history of stroke: Secondary | ICD-10-CM | POA: Diagnosis not present

## 2020-08-12 LAB — RESP PANEL BY RT-PCR (FLU A&B, COVID) ARPGX2
Influenza A by PCR: NEGATIVE
Influenza B by PCR: NEGATIVE
SARS Coronavirus 2 by RT PCR: NEGATIVE

## 2020-08-12 LAB — MRSA PCR SCREENING: MRSA by PCR: NEGATIVE

## 2020-08-12 MED ORDER — DIPHENHYDRAMINE HCL 25 MG PO CAPS
25.0000 mg | ORAL_CAPSULE | Freq: Four times a day (QID) | ORAL | Status: DC | PRN
  Filled 2020-08-12: qty 1

## 2020-08-12 MED ORDER — HYDROMORPHONE HCL 1 MG/ML IJ SOLN
0.5000 mg | INTRAMUSCULAR | Status: DC | PRN
  Administered 2020-08-12: 0.5 mg via INTRAVENOUS
  Administered 2020-08-12 – 2020-08-13 (×4): 1 mg via INTRAVENOUS
  Filled 2020-08-12 (×5): qty 1

## 2020-08-12 MED ORDER — MIDODRINE HCL 5 MG PO TABS
10.0000 mg | ORAL_TABLET | Freq: Three times a day (TID) | ORAL | Status: DC
  Administered 2020-08-12 – 2020-08-18 (×20): 10 mg via ORAL
  Filled 2020-08-12 (×20): qty 2

## 2020-08-12 MED ORDER — HYDROMORPHONE HCL 2 MG PO TABS
2.0000 mg | ORAL_TABLET | ORAL | Status: DC | PRN
  Administered 2020-08-12 – 2020-08-13 (×2): 2 mg via ORAL
  Filled 2020-08-12 (×3): qty 1

## 2020-08-12 MED ORDER — ONDANSETRON HCL 4 MG/2ML IJ SOLN
4.0000 mg | Freq: Three times a day (TID) | INTRAMUSCULAR | Status: DC
  Administered 2020-08-12 – 2020-08-18 (×19): 4 mg via INTRAVENOUS
  Filled 2020-08-12 (×22): qty 2

## 2020-08-12 MED ORDER — HYDROMORPHONE HCL 2 MG PO TABS
2.0000 mg | ORAL_TABLET | Freq: Two times a day (BID) | ORAL | 0 refills | Status: DC | PRN
Start: 2020-08-12 — End: 2020-08-12

## 2020-08-12 MED ORDER — SODIUM CHLORIDE 0.9 % IV SOLN: INTRAVENOUS | Status: DC

## 2020-08-12 MED ORDER — GABAPENTIN 300 MG PO CAPS
300.0000 mg | ORAL_CAPSULE | Freq: Two times a day (BID) | ORAL | Status: DC
  Administered 2020-08-12 (×2): 300 mg via ORAL
  Filled 2020-08-12 (×3): qty 1

## 2020-08-12 MED ORDER — TOPIRAMATE 25 MG PO TABS
25.0000 mg | ORAL_TABLET | Freq: Two times a day (BID) | ORAL | Status: DC
  Administered 2020-08-12 – 2020-08-18 (×13): 25 mg via ORAL
  Filled 2020-08-12 (×14): qty 1

## 2020-08-12 MED ORDER — METHOCARBAMOL 1000 MG/10ML IJ SOLN
500.0000 mg | Freq: Four times a day (QID) | INTRAVENOUS | Status: DC | PRN
  Filled 2020-08-12: qty 5

## 2020-08-12 MED ORDER — DEXAMETHASONE SODIUM PHOSPHATE 4 MG/ML IJ SOLN
4.0000 mg | Freq: Four times a day (QID) | INTRAMUSCULAR | Status: DC
  Administered 2020-08-12 – 2020-08-15 (×14): 4 mg via INTRAVENOUS
  Filled 2020-08-12 (×16): qty 1

## 2020-08-12 MED ORDER — ACETAMINOPHEN 325 MG PO TABS
650.0000 mg | ORAL_TABLET | Freq: Four times a day (QID) | ORAL | Status: DC | PRN
  Administered 2020-08-14 – 2020-08-17 (×8): 650 mg via ORAL
  Filled 2020-08-12 (×8): qty 2

## 2020-08-12 MED ORDER — PRAMIPEXOLE DIHYDROCHLORIDE 0.25 MG PO TABS
0.5000 mg | ORAL_TABLET | Freq: Every day | ORAL | Status: DC
  Administered 2020-08-12 – 2020-08-17 (×6): 0.5 mg via ORAL
  Filled 2020-08-12 (×7): qty 2

## 2020-08-12 MED ORDER — POLYETHYLENE GLYCOL 3350 17 G PO PACK
17.0000 g | PACK | Freq: Every day | ORAL | Status: DC | PRN
  Administered 2020-08-17: 17 g via ORAL
  Filled 2020-08-12: qty 1

## 2020-08-12 MED ORDER — ACETAMINOPHEN 650 MG RE SUPP: 650.0000 mg | Freq: Four times a day (QID) | RECTAL | Status: DC | PRN

## 2020-08-12 MED ORDER — HYDROMORPHONE HCL 2 MG PO TABS
2.0000 mg | ORAL_TABLET | ORAL | 0 refills | Status: DC | PRN
Start: 2020-08-12 — End: 2020-08-12

## 2020-08-12 MED ORDER — CELECOXIB 200 MG PO CAPS
200.0000 mg | ORAL_CAPSULE | Freq: Two times a day (BID) | ORAL | Status: DC
  Administered 2020-08-12: 200 mg via ORAL
  Filled 2020-08-12 (×2): qty 1

## 2020-08-12 MED ORDER — DIPHENHYDRAMINE HCL 25 MG PO CAPS: 25.0000 mg | ORAL_CAPSULE | Freq: Four times a day (QID) | ORAL | Status: DC | PRN

## 2020-08-12 MED ORDER — FLUDROCORTISONE ACETATE 0.1 MG PO TABS
0.1000 mg | ORAL_TABLET | Freq: Every day | ORAL | Status: DC
  Administered 2020-08-12 – 2020-08-18 (×7): 0.1 mg via ORAL
  Filled 2020-08-12 (×7): qty 1

## 2020-08-12 MED ORDER — BISACODYL 5 MG PO TBEC
5.0000 mg | DELAYED_RELEASE_TABLET | Freq: Every day | ORAL | Status: DC | PRN
  Administered 2020-08-16 – 2020-08-18 (×3): 5 mg via ORAL
  Filled 2020-08-12 (×3): qty 1

## 2020-08-12 MED ORDER — OXYCODONE HCL 5 MG PO TABS
5.0000 mg | ORAL_TABLET | ORAL | Status: DC | PRN
  Administered 2020-08-12: 5 mg via ORAL
  Filled 2020-08-12: qty 1

## 2020-08-12 NOTE — Progress Notes (Signed)
Subjective: Patient reports continued weakness and pain in right leg.   Objective: Vital signs in last 24 hours: Temp:  [97.5 F (36.4 C)-97.9 F (36.6 C)] 97.5 F (36.4 C) (12/09 0812) Pulse Rate:  [68-96] 73 (12/09 0812) Resp:  [14-23] 16 (12/09 0812) BP: (99-134)/(48-69) 99/48 (12/09 0812) SpO2:  [95 %-100 %] 95 % (12/09 0812) Weight:  [109.3 kg] 109.3 kg (12/08 2340)  Intake/Output from previous day: 12/08 0701 - 12/09 0700 In: 140.6 [I.V.:140.6] Out: -  Intake/Output this shift: No intake/output data recorded. Neurologic: RLE 1/5, she is firing in her quads when I hold her right knee up  Lab Results: Lab Results  Component Value Date   WBC 4.9 08/11/2020   HGB 12.6 08/11/2020   HCT 37.0 08/11/2020   MCV 99.5 08/11/2020   PLT 212 08/11/2020   Lab Results  Component Value Date   INR 1.0 08/22/2019   BMET Lab Results  Component Value Date   NA 138 08/11/2020   K 3.8 08/11/2020   CL 110 08/11/2020   CO2 19 (L) 08/11/2020   GLUCOSE 110 (H) 08/11/2020   BUN 44 (H) 08/11/2020   CREATININE 3.40 (H) 08/11/2020   CALCIUM 9.4 08/11/2020    Studies/Results: MR THORACIC SPINE W WO CONTRAST  Result Date: 08/12/2020 CLINICAL DATA:  Initial evaluation for possible CSF leak, status post recent spinal stimulator placement, severe right lower extremity pain. EXAM: MRI THORACIC AND LUMBAR SPINE WITHOUT AND WITH CONTRAST TECHNIQUE: Multiplanar and multiecho pulse sequences of the thoracic and lumbar spine were obtained without and with intravenous contrast. CONTRAST:  7mL GADAVIST GADOBUTROL 1 MMOL/ML IV SOLN COMPARISON:  Previous MRIs from 06/16/2020 and 05/15/2021. FINDINGS: MRI THORACIC SPINE FINDINGS Alignment: Vertebral bodies normally aligned with preservation of the normal thoracic kyphosis. No listhesis or static subluxation. Vertebrae: Vertebral body height maintained without acute or chronic fracture. Bone marrow signal intensity within normal limits. No discrete or  worrisome osseous lesions. No abnormal marrow edema or enhancement. Cord: Normal signal and morphology. No epidural or subdural collections to suggest CSF leak. No abnormal enhancement. Paraspinal and other soft tissues: Paraspinous soft tissues within normal limits. Dependent atelectatic changes noted within the lungs. Small simple cyst noted within the interpolar left kidney. Disc levels: Mild for age multilevel disc degeneration without significant disc bulge or focal disc herniation. No new spinal stenosis or neural foraminal encroachment. No neural impingement. MRI LUMBAR SPINE FINDINGS Segmentation: Standard. Lowest well-formed disc space labeled the L5-S1 level. Alignment: Mild levoscoliosis. Trace chronic anterolisthesis of L4 on L5. Alignment otherwise normal preservation of the normal lumbar lordosis. Vertebrae: Susceptibility artifact related to prior PLIF at L2 through L5. Vertebral body height maintained without acute or chronic fracture. Bone marrow signal intensity within normal limits. No discrete or worrisome osseous lesions. No abnormal marrow edema or enhancement. Conus medullaris: Extends to the L1 level. There is a subtle focus of increased T2/STIR signal intensity within the right aspect of the distal cord at the level of T12-L1 (series 6, image 9 on sagittal STIR sequence, series 8, image 3 on axial T2 weighted sequence). Associated subtle postcontrast enhancement (series 40, image 10). This is new from previous. Given the history of recent spinal stimulator placement, finding suspicious for a small focus of edema/acute spinal cord injury. Remainder of the visualized conus is otherwise normal as are the nerve roots of the cauda equina. No subdural or epidural collections to suggest CSF leak. Paraspinal and other soft tissues: Soft tissue edema present within the posterior  paraspinous soft tissues extending from T12 through L1, likely postoperative in nature. No discrete collections. Chronic  postoperative changes noted inferiorly. Subcentimeter simple cyst noted within the left kidney. Visualized visceral structures unremarkable. Disc levels: T12-L1: Unremarkable. L1-2: Mild disc bulge with disc desiccation. Small chronic endplate Schmorl's node deformity. Mild facet hypertrophy. No spinal stenosis. Foramina remain patent. L2-3: Prior PLIF with left facetectomy and foraminotomy. No recurrent canal or foraminal stenosis. L3-4: Prior PLIF with left facetectomy and foraminotomy. No residual spinal stenosis. Left neural foramina remains patent. Residual right foraminal protrusion closely approximates the exiting right L3 nerve root without frank impingement or significant foraminal stenosis (series 5, image 5), stable. L4-5: Prior PLIF with left facetectomy and foraminotomy. No residual spinal stenosis. Stable foraminal patency. L5-S1: Negative interspace. Minimal facet hypertrophy. No canal or foraminal stenosis. IMPRESSION: 1. Subtle focus of increased T2/STIR signal intensity with postcontrast enhancement within the right aspect of the distal cord at the level of T12-L1, new from previous exam. Given the history of recent spinal stimulator placement, finding suspicious for a small focus of edema/acute spinal cord injury. 2. No other acute abnormality within the thoracic or lumbar spine. No evidence for CSF leak. 3. Prior PLIF at L2 through L5 without residual or recurrent spinal stenosis. Residual right foraminal protrusion at L3-4, closely approximating and potentially irritating the exiting right L3 nerve root, stable. Electronically Signed   By: Jeannine Boga M.D.   On: 08/12/2020 00:05   MR Lumbar Spine W Wo Contrast  Result Date: 08/12/2020 CLINICAL DATA:  Initial evaluation for possible CSF leak, status post recent spinal stimulator placement, severe right lower extremity pain. EXAM: MRI THORACIC AND LUMBAR SPINE WITHOUT AND WITH CONTRAST TECHNIQUE: Multiplanar and multiecho pulse  sequences of the thoracic and lumbar spine were obtained without and with intravenous contrast. CONTRAST:  35mL GADAVIST GADOBUTROL 1 MMOL/ML IV SOLN COMPARISON:  Previous MRIs from 06/16/2020 and 05/15/2021. FINDINGS: MRI THORACIC SPINE FINDINGS Alignment: Vertebral bodies normally aligned with preservation of the normal thoracic kyphosis. No listhesis or static subluxation. Vertebrae: Vertebral body height maintained without acute or chronic fracture. Bone marrow signal intensity within normal limits. No discrete or worrisome osseous lesions. No abnormal marrow edema or enhancement. Cord: Normal signal and morphology. No epidural or subdural collections to suggest CSF leak. No abnormal enhancement. Paraspinal and other soft tissues: Paraspinous soft tissues within normal limits. Dependent atelectatic changes noted within the lungs. Small simple cyst noted within the interpolar left kidney. Disc levels: Mild for age multilevel disc degeneration without significant disc bulge or focal disc herniation. No new spinal stenosis or neural foraminal encroachment. No neural impingement. MRI LUMBAR SPINE FINDINGS Segmentation: Standard. Lowest well-formed disc space labeled the L5-S1 level. Alignment: Mild levoscoliosis. Trace chronic anterolisthesis of L4 on L5. Alignment otherwise normal preservation of the normal lumbar lordosis. Vertebrae: Susceptibility artifact related to prior PLIF at L2 through L5. Vertebral body height maintained without acute or chronic fracture. Bone marrow signal intensity within normal limits. No discrete or worrisome osseous lesions. No abnormal marrow edema or enhancement. Conus medullaris: Extends to the L1 level. There is a subtle focus of increased T2/STIR signal intensity within the right aspect of the distal cord at the level of T12-L1 (series 6, image 9 on sagittal STIR sequence, series 8, image 3 on axial T2 weighted sequence). Associated subtle postcontrast enhancement (series 40,  image 10). This is new from previous. Given the history of recent spinal stimulator placement, finding suspicious for a small focus of edema/acute spinal  cord injury. Remainder of the visualized conus is otherwise normal as are the nerve roots of the cauda equina. No subdural or epidural collections to suggest CSF leak. Paraspinal and other soft tissues: Soft tissue edema present within the posterior paraspinous soft tissues extending from T12 through L1, likely postoperative in nature. No discrete collections. Chronic postoperative changes noted inferiorly. Subcentimeter simple cyst noted within the left kidney. Visualized visceral structures unremarkable. Disc levels: T12-L1: Unremarkable. L1-2: Mild disc bulge with disc desiccation. Small chronic endplate Schmorl's node deformity. Mild facet hypertrophy. No spinal stenosis. Foramina remain patent. L2-3: Prior PLIF with left facetectomy and foraminotomy. No recurrent canal or foraminal stenosis. L3-4: Prior PLIF with left facetectomy and foraminotomy. No residual spinal stenosis. Left neural foramina remains patent. Residual right foraminal protrusion closely approximates the exiting right L3 nerve root without frank impingement or significant foraminal stenosis (series 5, image 5), stable. L4-5: Prior PLIF with left facetectomy and foraminotomy. No residual spinal stenosis. Stable foraminal patency. L5-S1: Negative interspace. Minimal facet hypertrophy. No canal or foraminal stenosis. IMPRESSION: 1. Subtle focus of increased T2/STIR signal intensity with postcontrast enhancement within the right aspect of the distal cord at the level of T12-L1, new from previous exam. Given the history of recent spinal stimulator placement, finding suspicious for a small focus of edema/acute spinal cord injury. 2. No other acute abnormality within the thoracic or lumbar spine. No evidence for CSF leak. 3. Prior PLIF at L2 through L5 without residual or recurrent spinal stenosis.  Residual right foraminal protrusion at L3-4, closely approximating and potentially irritating the exiting right L3 nerve root, stable. Electronically Signed   By: Jeannine Boga M.D.   On: 08/12/2020 00:05    Assessment/Plan: MRI t spine did show a hyperintense signal at T12. It is small but I think it is going to take time and therapy for her to improve. Will start her on gabapentin today and order a diet. Therapy as tolerated today.   LOS: 0 days    Vanessa Medina Vanessa Medina 08/12/2020, 8:16 AM

## 2020-08-12 NOTE — Progress Notes (Signed)
Pt arrived to floor on stretcher from ED, alert and oriented, in severe pain after being transferred onto bed.  Pt turned and skin assessed, sacral foam placed.  SCD placed on LLE only due to severe pain in RLE.  1mg  dilaudid given, MRSA swab completed, call light within reach.. Will continue to monitor status and pain.

## 2020-08-12 NOTE — Progress Notes (Unsigned)
Patient

## 2020-08-12 NOTE — Progress Notes (Signed)
Patient ID: Vanessa Medina, female   DOB: 1971-02-22, 49 y.o.   MRN: [id number]   Patient seen at bedside today.  I have reviewed Dr. Ronnald Medina and Vanessa Medina notes and agree with their findings.  I have reviewed her MRI from last night.  Upon visit, patient continues to complain of 10/10 right lower extremity pain with limited mobility following attempted SCS placement.  She recently received oxycodone 5mg  and was itching.  She notes some benefit from the IV hydromophone.  Gabapentin 300mg  BID has been ordered.  Examination: 1/5 strength throughout RLE, diffuse tenderness to palpation.  A/p Pleaseant female with likely SCI, subsequent RLE pain and weakness  1.  Given her reaction to oxycodone, I will discontinue this and start hydromorphone 2mg  PO q6hrs prn in addition to the IV dosing as needed.  2.  Given her history of multiple medication allergies and sensitivities, will provide diphenhydramine 25mg  PO PRN.  3.  Will follow her progress and adjust pain medications as needed.  4.  Patient's condition, MRI findings, and current plans to manage pain and involve PT as soon as able discussed with son today at patient's bedside.

## 2020-08-13 MED ORDER — ENOXAPARIN SODIUM 30 MG/0.3ML ~~LOC~~ SOLN
30.0000 mg | Freq: Every day | SUBCUTANEOUS | Status: DC
  Administered 2020-08-13 – 2020-08-18 (×6): 30 mg via SUBCUTANEOUS
  Filled 2020-08-13 (×5): qty 0.3

## 2020-08-13 MED ORDER — NALOXONE HCL 0.4 MG/ML IJ SOLN: 0.4000 mg | INTRAMUSCULAR | Status: DC | PRN

## 2020-08-13 MED ORDER — HYDROMORPHONE HCL 2 MG PO TABS
4.0000 mg | ORAL_TABLET | ORAL | Status: DC | PRN
  Administered 2020-08-13 – 2020-08-16 (×12): 4 mg via ORAL
  Filled 2020-08-13 (×12): qty 2

## 2020-08-13 MED ORDER — GABAPENTIN 300 MG PO CAPS
300.0000 mg | ORAL_CAPSULE | Freq: Three times a day (TID) | ORAL | Status: DC
  Administered 2020-08-13 – 2020-08-16 (×10): 300 mg via ORAL
  Filled 2020-08-13 (×9): qty 1

## 2020-08-13 MED ORDER — HYDROMORPHONE HCL 1 MG/ML IJ SOLN
1.0000 mg | INTRAMUSCULAR | Status: DC | PRN
  Administered 2020-08-13 – 2020-08-15 (×3): 1 mg via INTRAVENOUS
  Filled 2020-08-13: qty 2
  Filled 2020-08-13 (×2): qty 1

## 2020-08-13 NOTE — Progress Notes (Signed)
Patient with continued right lower extremity pain.  Notes benefit from IV hydromorphone, this reduced pain from 10 to 7.  Nurse reports patient tolerating oral PO hydromorphone without drowsiness.  Examination:  Diffuse allodynia thought RLE 1/5 strength throughout  A/P Pleasant female with RLE pain  1.  Increase oral hydromorphone to 4 mg po q 3hrs prn.  2.  Increase IV hydromorphone to 1-1.5mg  q2hrs prn.  3.  Increase gabapentin to 300mg  po TID.  4.  Narcan PRN ordered.  5.  Will continue to follow for pain.

## 2020-08-13 NOTE — Progress Notes (Signed)
PT Cancellation Note  Patient Details Name: Vanessa Medina MRN: [id number] DOB: September 29, 1970   Cancelled Treatment:    Reason Eval/Treat Not Completed: Medical issues which prohibited therapy;Patient not medically ready.  Pt too painful to proceed with evaluation yet. 08/13/2020  Ginger Carne., PT Acute Rehabilitation Services [phone]  (pager) [phone]  (office)   Tessie Fass Vidhi Delellis 08/13/2020, 1:16 PM

## 2020-08-13 NOTE — Progress Notes (Signed)
Subjective: Patient reports no real change in right lower extremity pain.  Most of the pain is in the foot with any touching of the foot.  Still very little movement in the right leg.  Objective: Vital signs in last 24 hours: Temp:  [97.4 F (36.3 C)-98.5 F (36.9 C)] 98.4 F (36.9 C) (12/10 0740) Pulse Rate:  [46-64] 57 (12/10 0740) Resp:  [16-18] 16 (12/10 0740) BP: (94-109)/(50-66) 97/52 (12/10 0740) SpO2:  [94 %-96 %] 94 % (12/10 0740)  Intake/Output from previous day: 12/09 0701 - 12/10 0700 In: 965 [I.V.:965] Out: 550 [Urine:550] Intake/Output this shift: No intake/output data recorded.  Neurologic: Grossly normal except for severe neuropathic pain in the right foot and about 1 out of 5 movement in the ankle and hip on the right.  It is hard to get a good exam on the right lower extremity because it is difficult to touch her leg because of severe pain.  Lab Results: Lab Results  Component Value Date   WBC 4.9 08/11/2020   HGB 12.6 08/11/2020   HCT 37.0 08/11/2020   MCV 99.5 08/11/2020   PLT 212 08/11/2020   Lab Results  Component Value Date   INR 1.0 08/22/2019   BMET Lab Results  Component Value Date   NA 138 08/11/2020   K 3.8 08/11/2020   CL 110 08/11/2020   CO2 19 (L) 08/11/2020   GLUCOSE 110 (H) 08/11/2020   BUN 44 (H) 08/11/2020   CREATININE 3.40 (H) 08/11/2020   CALCIUM 9.4 08/11/2020    Studies/Results: MR THORACIC SPINE W WO CONTRAST  Result Date: 08/12/2020 CLINICAL DATA:  Initial evaluation for possible CSF leak, status post recent spinal stimulator placement, severe right lower extremity pain. EXAM: MRI THORACIC AND LUMBAR SPINE WITHOUT AND WITH CONTRAST TECHNIQUE: Multiplanar and multiecho pulse sequences of the thoracic and lumbar spine were obtained without and with intravenous contrast. CONTRAST:  73mL GADAVIST GADOBUTROL 1 MMOL/ML IV SOLN COMPARISON:  Previous MRIs from 06/16/2020 and 05/15/2021. FINDINGS: MRI THORACIC SPINE FINDINGS  Alignment: Vertebral bodies normally aligned with preservation of the normal thoracic kyphosis. No listhesis or static subluxation. Vertebrae: Vertebral body height maintained without acute or chronic fracture. Bone marrow signal intensity within normal limits. No discrete or worrisome osseous lesions. No abnormal marrow edema or enhancement. Cord: Normal signal and morphology. No epidural or subdural collections to suggest CSF leak. No abnormal enhancement. Paraspinal and other soft tissues: Paraspinous soft tissues within normal limits. Dependent atelectatic changes noted within the lungs. Small simple cyst noted within the interpolar left kidney. Disc levels: Mild for age multilevel disc degeneration without significant disc bulge or focal disc herniation. No new spinal stenosis or neural foraminal encroachment. No neural impingement. MRI LUMBAR SPINE FINDINGS Segmentation: Standard. Lowest well-formed disc space labeled the L5-S1 level. Alignment: Mild levoscoliosis. Trace chronic anterolisthesis of L4 on L5. Alignment otherwise normal preservation of the normal lumbar lordosis. Vertebrae: Susceptibility artifact related to prior PLIF at L2 through L5. Vertebral body height maintained without acute or chronic fracture. Bone marrow signal intensity within normal limits. No discrete or worrisome osseous lesions. No abnormal marrow edema or enhancement. Conus medullaris: Extends to the L1 level. There is a subtle focus of increased T2/STIR signal intensity within the right aspect of the distal cord at the level of T12-L1 (series 6, image 9 on sagittal STIR sequence, series 8, image 3 on axial T2 weighted sequence). Associated subtle postcontrast enhancement (series 40, image 10). This is new from previous. Given the history  of recent spinal stimulator placement, finding suspicious for a small focus of edema/acute spinal cord injury. Remainder of the visualized conus is otherwise normal as are the nerve roots of the  cauda equina. No subdural or epidural collections to suggest CSF leak. Paraspinal and other soft tissues: Soft tissue edema present within the posterior paraspinous soft tissues extending from T12 through L1, likely postoperative in nature. No discrete collections. Chronic postoperative changes noted inferiorly. Subcentimeter simple cyst noted within the left kidney. Visualized visceral structures unremarkable. Disc levels: T12-L1: Unremarkable. L1-2: Mild disc bulge with disc desiccation. Small chronic endplate Schmorl's node deformity. Mild facet hypertrophy. No spinal stenosis. Foramina remain patent. L2-3: Prior PLIF with left facetectomy and foraminotomy. No recurrent canal or foraminal stenosis. L3-4: Prior PLIF with left facetectomy and foraminotomy. No residual spinal stenosis. Left neural foramina remains patent. Residual right foraminal protrusion closely approximates the exiting right L3 nerve root without frank impingement or significant foraminal stenosis (series 5, image 5), stable. L4-5: Prior PLIF with left facetectomy and foraminotomy. No residual spinal stenosis. Stable foraminal patency. L5-S1: Negative interspace. Minimal facet hypertrophy. No canal or foraminal stenosis. IMPRESSION: 1. Subtle focus of increased T2/STIR signal intensity with postcontrast enhancement within the right aspect of the distal cord at the level of T12-L1, new from previous exam. Given the history of recent spinal stimulator placement, finding suspicious for a small focus of edema/acute spinal cord injury. 2. No other acute abnormality within the thoracic or lumbar spine. No evidence for CSF leak. 3. Prior PLIF at L2 through L5 without residual or recurrent spinal stenosis. Residual right foraminal protrusion at L3-4, closely approximating and potentially irritating the exiting right L3 nerve root, stable. Electronically Signed   By: Jeannine Boga M.D.   On: 08/12/2020 00:05   MR Lumbar Spine W Wo  Contrast  Result Date: 08/12/2020 CLINICAL DATA:  Initial evaluation for possible CSF leak, status post recent spinal stimulator placement, severe right lower extremity pain. EXAM: MRI THORACIC AND LUMBAR SPINE WITHOUT AND WITH CONTRAST TECHNIQUE: Multiplanar and multiecho pulse sequences of the thoracic and lumbar spine were obtained without and with intravenous contrast. CONTRAST:  37mL GADAVIST GADOBUTROL 1 MMOL/ML IV SOLN COMPARISON:  Previous MRIs from 06/16/2020 and 05/15/2021. FINDINGS: MRI THORACIC SPINE FINDINGS Alignment: Vertebral bodies normally aligned with preservation of the normal thoracic kyphosis. No listhesis or static subluxation. Vertebrae: Vertebral body height maintained without acute or chronic fracture. Bone marrow signal intensity within normal limits. No discrete or worrisome osseous lesions. No abnormal marrow edema or enhancement. Cord: Normal signal and morphology. No epidural or subdural collections to suggest CSF leak. No abnormal enhancement. Paraspinal and other soft tissues: Paraspinous soft tissues within normal limits. Dependent atelectatic changes noted within the lungs. Small simple cyst noted within the interpolar left kidney. Disc levels: Mild for age multilevel disc degeneration without significant disc bulge or focal disc herniation. No new spinal stenosis or neural foraminal encroachment. No neural impingement. MRI LUMBAR SPINE FINDINGS Segmentation: Standard. Lowest well-formed disc space labeled the L5-S1 level. Alignment: Mild levoscoliosis. Trace chronic anterolisthesis of L4 on L5. Alignment otherwise normal preservation of the normal lumbar lordosis. Vertebrae: Susceptibility artifact related to prior PLIF at L2 through L5. Vertebral body height maintained without acute or chronic fracture. Bone marrow signal intensity within normal limits. No discrete or worrisome osseous lesions. No abnormal marrow edema or enhancement. Conus medullaris: Extends to the L1 level.  There is a subtle focus of increased T2/STIR signal intensity within the right aspect of the  distal cord at the level of T12-L1 (series 6, image 9 on sagittal STIR sequence, series 8, image 3 on axial T2 weighted sequence). Associated subtle postcontrast enhancement (series 40, image 10). This is new from previous. Given the history of recent spinal stimulator placement, finding suspicious for a small focus of edema/acute spinal cord injury. Remainder of the visualized conus is otherwise normal as are the nerve roots of the cauda equina. No subdural or epidural collections to suggest CSF leak. Paraspinal and other soft tissues: Soft tissue edema present within the posterior paraspinous soft tissues extending from T12 through L1, likely postoperative in nature. No discrete collections. Chronic postoperative changes noted inferiorly. Subcentimeter simple cyst noted within the left kidney. Visualized visceral structures unremarkable. Disc levels: T12-L1: Unremarkable. L1-2: Mild disc bulge with disc desiccation. Small chronic endplate Schmorl's node deformity. Mild facet hypertrophy. No spinal stenosis. Foramina remain patent. L2-3: Prior PLIF with left facetectomy and foraminotomy. No recurrent canal or foraminal stenosis. L3-4: Prior PLIF with left facetectomy and foraminotomy. No residual spinal stenosis. Left neural foramina remains patent. Residual right foraminal protrusion closely approximates the exiting right L3 nerve root without frank impingement or significant foraminal stenosis (series 5, image 5), stable. L4-5: Prior PLIF with left facetectomy and foraminotomy. No residual spinal stenosis. Stable foraminal patency. L5-S1: Negative interspace. Minimal facet hypertrophy. No canal or foraminal stenosis. IMPRESSION: 1. Subtle focus of increased T2/STIR signal intensity with postcontrast enhancement within the right aspect of the distal cord at the level of T12-L1, new from previous exam. Given the history of  recent spinal stimulator placement, finding suspicious for a small focus of edema/acute spinal cord injury. 2. No other acute abnormality within the thoracic or lumbar spine. No evidence for CSF leak. 3. Prior PLIF at L2 through L5 without residual or recurrent spinal stenosis. Residual right foraminal protrusion at L3-4, closely approximating and potentially irritating the exiting right L3 nerve root, stable. Electronically Signed   By: Jeannine Boga M.D.   On: 08/12/2020 00:05    Assessment/Plan: This is a patient of Dr. Dionne Ano who suffered a spinal cord injury in the distal cord during an attempted spinal cord stimulator placement at an outpatient surgery center who now has weakness in the right leg and neuropathic pain in the right leg.  Continue pain control.  Continue neuro modulating medications.  Unfortunately at all think she can tolerate therapy at this point because of her pain.  As her pain is better controlled and I would like to get physical therapy as I think she is going to need inpatient rehab.  I have tried to explain all this to her as best I can.  I think she has demonstrated understanding.  We will add DVT prophylaxis  Estimated body mass index is 41.36 kg/m as calculated from the following:   Height as of this encounter: 5\' 4"  (1.626 m).   Weight as of this encounter: 109.3 kg.    LOS: 1 day    Vanessa Medina 08/13/2020, 9:56 AM

## 2020-08-14 DIAGNOSIS — R29898 Other symptoms and signs involving the musculoskeletal system: Secondary | ICD-10-CM

## 2020-08-14 NOTE — Evaluation (Signed)
Physical Therapy Evaluation Patient Details Name: Vanessa Medina MRN: [id number] DOB: 1971/02/01 Today's Date: 08/14/2020   History of Present Illness  The pt is a 49 yo female presenting after acute onset weakness and pain in her RLE in pacu at surgical center for attempted spinal cord stimulator placement. PMH includes: multiple spine surgeries and multi-level fusion, with most recent operation one year ago.  Anemia, GERD, HTN, obesity, and chronic back pain.  Clinical Impression  Pt in bed upon arrival of PT, agreeable to evaluation at this time. Prior to admission the pt was independent with mobility, ADLs, and IADLs, but required increased time and effort due to back pain. The pt also reports multiple falls after removal of spinal cord stimulator trial due to dragging her feet and poor pain control. The pt now presents with limitations in functional mobility, strength, power, stability, and endurance due to above dx and resulting pain, and will continue to benefit from skilled PT to address these deficits. The pt was able to complete initial bed mobility with minA to RLE only, and increased time for her efforts. She was then able to complete OOB transfers with modA and BUE support on RW to manage balance and pain. The pt was able to initiate lateral steps as well at this time, but was limited by pain and drop in BP with initial transition to sitting. The pt will continue to benefit from skilled PT to progress mobility, technique, and capacity for functional transfers and independence with mobility.   BP during session: - supine: 127/66 - sitting EOB: 110/64 - sitting EOB after 5 min: 120/65     Follow Up Recommendations CIR    Equipment Recommendations   (defer to post acute)    Recommendations for Other Services Rehab consult     Precautions / Restrictions Precautions Precautions: Fall Precaution Comments: watch BP Restrictions Weight Bearing Restrictions: No Other  Position/Activity Restrictions: RLE limited by significant pain      Mobility  Bed Mobility Overal bed mobility: Needs Assistance Bed Mobility: Supine to Sit;Sit to Supine     Supine to sit: Min assist Sit to supine: Min assist   General bed mobility comments: minA to manage RLE with pillow. pt able to move well without significant assist    Transfers Overall transfer level: Needs assistance Equipment used: Rolling walker (2 wheeled) Transfers: Sit to/from Stand Sit to Stand: Mod assist         General transfer comment: modA to power up and steady with pillow under RLE to reduce contact and pain. significant reliance on BUE on RW  Ambulation/Gait Ambulation/Gait assistance: Mod assist Gait Distance (Feet): 3 Feet Assistive device: Rolling walker (2 wheeled) Gait Pattern/deviations: Step-to pattern;Decreased stride length Gait velocity: decreased Gait velocity interpretation: <1.31 ft/sec, indicative of household ambulator General Gait Details: small lateral steps with modA to steady, manage RW, and verbal cues for each step, pt able to slide RLE laterally on pillow and move LLE with increased WB through UE     Balance Overall balance assessment: Needs assistance Sitting-balance support: No upper extremity supported;Feet supported Sitting balance-Leahy Scale: Fair     Standing balance support: Bilateral upper extremity supported;During functional activity Standing balance-Leahy Scale: Poor Standing balance comment: reliant on UE support                             Pertinent Vitals/Pain Pain Assessment: 0-10 Pain Score: 8  Pain Location: inside of RLE  and foot Pain Descriptors / Indicators: Numbness (sharp and shocking with touch) Pain Intervention(s): Limited activity within patient's tolerance;Monitored during session;Repositioned    Home Living Family/patient expects to be discharged to:: Private residence Living Arrangements: Parent Available  Help at Discharge: Family;Available 24 hours/day Type of Home: House Home Access: Stairs to enter Entrance Stairs-Rails: Psychiatric nurse of Steps: 2 (back deck has been redone to be more steps with shorter rise, maybe 15 stairs) Home Layout: One level Home Equipment: Walker - 4 wheels;Cane - quad;Grab bars - toilet;Shower seat;Grab bars - tub/shower;Hand held shower head;Bedside commode      Prior Function Level of Independence: Independent         Comments: after removal of trial SCS, pt reports 2 major falls in last month, bruising to R knee after fall. LOC unsure about hitting head. independent with increased time when home alone for ADLs and IADLs. works from home on [place] Hand: Right    Extremity/Trunk Assessment   Upper Extremity Assessment Upper Extremity Assessment: Overall WFL for tasks assessed    Lower Extremity Assessment Lower Extremity Assessment: RLE deficits/detail RLE Deficits / Details: significant pain, pt able to move functionally at hip, limited knee ROM activelyy and passively and no ankle ROM all due to pain RLE: Unable to fully assess due to pain RLE Sensation:  (pt reports numbness) RLE Coordination: decreased fine motor    Cervical / Trunk Assessment Cervical / Trunk Assessment: Normal  Communication   Communication: No difficulties  Cognition Arousal/Alertness: Awake/alert Behavior During Therapy: WFL for tasks assessed/performed Overall Cognitive Status: Within Functional Limits for tasks assessed                                               Assessment/Plan    PT Assessment Patient needs continued PT services  PT Problem List Decreased activity tolerance;Decreased strength;Decreased balance;Decreased mobility;Pain       PT Treatment Interventions DME instruction;Gait training;Stair training;Functional mobility training;Therapeutic activities;Balance  training;Therapeutic exercise;Patient/family education    PT Goals (Current goals can be found in the Care Plan section)  Acute Rehab PT Goals Patient Stated Goal: return home by christmas/her birthday PT Goal Formulation: With patient Time For Goal Achievement: 08/28/20 Potential to Achieve Goals: Good    Frequency Min 4X/week    AM-PAC PT "6 Clicks" Mobility  Outcome Measure Help needed turning from your back to your side while in a flat bed without using bedrails?: A Little Help needed moving from lying on your back to sitting on the side of a flat bed without using bedrails?: A Little Help needed moving to and from a bed to a chair (including a wheelchair)?: A Lot Help needed standing up from a chair using your arms (e.g., wheelchair or bedside chair)?: A Lot Help needed to walk in hospital room?: A Lot Help needed climbing 3-5 steps with a railing? : Total 6 Click Score: 13    End of Session Equipment Utilized During Treatment: Gait belt Activity Tolerance: Patient limited by pain Patient left: in bed;with call bell/phone within reach Nurse Communication: Mobility status;Patient requests pain meds PT Visit Diagnosis: Other abnormalities of gait and mobility (R26.89);Pain Pain - Right/Left: Right Pain - part of body: Leg    Time: 0913-1005 PT Time Calculation (min) (ACUTE ONLY): 52 min   Charges:  PT Evaluation $PT Eval Moderate Complexity: 1 Mod PT Treatments $Gait Training: 8-22 mins $Therapeutic Activity: 8-22 mins        Karma Ganja, PT, DPT   Acute Rehabilitation Department Pager #: [phone]  Otho Bellows 08/14/2020, 10:32 AM

## 2020-08-14 NOTE — Progress Notes (Signed)
Inpatient Rehab Admissions Coordinator:   Met with patient at bedside to discuss potential CIR admission. Pt. Stated interest. Will pursue for potential admit next week, pending bed availability and insurance authorization.  Tarina Volk, MS, CCC-SLP Rehab Admissions Coordinator  [phone] (celll) [phone] (office)   

## 2020-08-14 NOTE — Progress Notes (Signed)
Doing well.  She can wiggle her toes and move her ankle little better today than yesterday.  Still has neuropathic pain with any touch of the foot.  Continue pain control.  Continue to mobilize.  PT OT.  Will need rehab.

## 2020-08-14 NOTE — Progress Notes (Addendum)
Pt refused to void this AM. [place] offered. Nurse and nurse tech offered to ambulate pt to [facility] throughout night. Pt refused. Pt stated she would void when she wakes up.   Bladder scan shows 611 mL. Pt educated on the importance of voiding. Pt agree to void. Pt place on BSC @0655 . See flowsheet for output.

## 2020-08-14 NOTE — PMR Pre-admission (Deleted)
PMR Admission Coordinator Pre-Admission Assessment  Patient: Vanessa Medina is an 49 y.o., female MRN: [id number] DOB: [birth year] Height: $RemoveBeforeDE'5\' 4"'[id number]$  (162.6 cm) Weight: 109.3 kg              Insurance Information HMO:     PPO:      PCP:      IPA:      80/20:      OTHER:  PRIMARY: [place]     Policy#: [id number]      Subscriber: [place] Name: [name]      Phone#: [phone]    Fax#: [phone] Pre-Cert#:  [id number].      Employer: [employer] provided by [employer] for admit to [employer] Auth provided on 08/17/20. Clinical updates are due to [facility] in 7 days. (p): [phone] (f): [phone] Benefits:  Phone #: online      Name: [email or web address]  Eff. Date: 09/05/2019 - 09/03/2020  Deductible: $750 ($750 met) OOP Max: $2,500 ($2,500 met - includes deductible) CIR: 10%co-insurance After you pay the deductible. SNF:  10%co-insurance After you pay the deductible. Outpatient: 10%coinsurance After you pay the deductible. Home Health: 10%co-insurance After you pay the deductible. DME: 10%coinsurance After you pay the deductible.  Providers: In network  SECONDARY:     None   The "Data Collection Information Summary" for patients in Inpatient Rehabilitation Facilities with attached "Privacy Act [place] Records" was provided and verbally reviewed with: N/A  Emergency Contact Information Contact Information    Name Relation Home Work Mobile   Pacheco,Christian Son [phone]       Current Medical History  Patient Admitting Diagnosis: Spinal Cord Injury History of Present Illness: The pt is a 49 yo female presenting after acute onset weakness and pain in her RLE in pacu at surgical center for attempted spinal cord stimulator placement. PMH includes: multiple spine surgeries and multi-level fusion, with most recent operation one year ago.  Anemia, GERD, HTN, obesity, and chronic back pain.   Glasgow Coma Scale Score: 15  Past Medical History  Past Medical History:   Diagnosis Date  . Anemia   . Cervical cancer ([place])   . Family history of adverse reaction to anesthesia     " my mother takes a long time time wake up"  . GERD (gastroesophageal reflux disease)   . Headache    migraine  . History of shingles   . Hypertension   . Migraine   . Multiple allergies   . Obesity   . Osteoarthritis    left hip  . PONV (postoperative nausea and vomiting)   . Sleep apnea    does not wear CPAP  . Wears glasses     Family History  family history includes Breast cancer in her cousin; Diabetes in an other family member; Heart attack (age of onset: 63) in her brother; Heart disease in her mother; Heart failure in her mother; Hypertension in her mother; Renal Disease in her mother; Stroke in her brother; Sudden Cardiac Death in her mother; Valvular heart disease in her mother.  Prior Rehab/Hospitalizations:  Has the patient had prior rehab or hospitalizations prior to admission? Yes  Has the patient had major surgery during 100 days prior to admission? Yes  Current Medications   Current Facility-Administered Medications:  .  0.9 %  sodium chloride infusion, , Intravenous, Continuous, Meyran, Ocie Cornfield, NP, Last Rate: 50 mL/hr at 08/17/20 0600, Infusion Verify at 08/17/20 0600 .  acetaminophen (TYLENOL) tablet 650 mg, 650 mg,  Oral, Q6H PRN, 650 mg at 08/17/20 1417 **OR** acetaminophen (TYLENOL) suppository 650 mg, 650 mg, Rectal, Q6H PRN, Meyran, Ocie Cornfield, NP .  bisacodyl (DULCOLAX) EC tablet 5 mg, 5 mg, Oral, Daily PRN, Meyran, Ocie Cornfield, NP, 5 mg at 08/18/20 0902 .  dexamethasone (DECADRON) injection 2 mg, 2 mg, Intravenous, Q6H, Eustace Moore, MD, 2 mg at 08/18/20 0547 .  diphenhydrAMINE (BENADRYL) capsule 25 mg, 25 mg, Oral, Q6H PRN, Reece Agar, MD .  enoxaparin (LOVENOX) injection 30 mg, 30 mg, Subcutaneous, Daily, Dang, Thuy D, RPH, 30 mg at 08/18/20 0904 .  fludrocortisone (FLORINEF) tablet 0.1 mg, 0.1 mg, Oral, Daily, Meyran,  Ocie Cornfield, NP, 0.1 mg at 08/18/20 0902 .  gabapentin (NEURONTIN) capsule 400 mg, 400 mg, Oral, TID, Reece Agar, MD, 400 mg at 08/18/20 0902 .  HYDROmorphone (DILAUDID) injection 1-1.5 mg, 1-1.5 mg, Intravenous, Q2H PRN, Reece Agar, MD, 1 mg at 08/15/20 1225 .  HYDROmorphone (DILAUDID) tablet 4 mg, 4 mg, Oral, Q3H PRN, Reece Agar, MD, 4 mg at 08/16/20 2155 .  methocarbamol (ROBAXIN) tablet 750 mg, 750 mg, Oral, Q6H PRN, Reece Agar, MD .  metoCLOPramide (REGLAN) tablet 10 mg, 10 mg, Oral, TID AC, Reece Agar, MD, 10 mg at 08/18/20 0902 .  midodrine (PROAMATINE) tablet 10 mg, 10 mg, Oral, TID WC, Meyran, Ocie Cornfield, NP, 10 mg at 08/18/20 0902 .  naloxone [facility]) injection 0.4 mg, 0.4 mg, Intravenous, PRN, Reece Agar, MD .  ondansetron [facility]) injection 4 mg, 4 mg, Intravenous, Q8H, Meyran, Ocie Cornfield, NP, 4 mg at 08/18/20 0547 .  pantoprazole (PROTONIX) EC tablet 20 mg, 20 mg, Oral, Daily, Reece Agar, MD, 20 mg at 08/18/20 0902 .  polyethylene glycol (MIRALAX / GLYCOLAX) packet 17 g, 17 g, Oral, Daily PRN, Meyran, Ocie Cornfield, NP, 17 g at 08/17/20 0836 .  pramipexole (MIRAPEX) tablet 0.5 mg, 0.5 mg, Oral, QHS, Meyran, Ocie Cornfield, NP, 0.5 mg at 08/17/20 2135 .  topiramate (TOPAMAX) tablet 25 mg, 25 mg, Oral, BID, Meyran, Ocie Cornfield, NP, 25 mg at 08/18/20 0902  Patients Current Diet:  Diet Order            Diet regular Room service appropriate? Yes; Fluid consistency: Thin  Diet effective now                 Precautions / Restrictions Precautions Precautions: Fall Precaution Comments: watch BP Restrictions Weight Bearing Restrictions: No Other Position/Activity Restrictions: RLE limited by significant pain   Has the patient had 2 or more falls or a fall with injury in the past year?Yes  Prior Activity Level Community (5-7x/wk): Pt. was active in community PTA  Prior Functional Level Prior Function Level of  Independence: Independent Comments: after removal of trial SCS, pt reports 2 major falls in last month, bruising to R knee after fall. LOC unsure about hitting head. independent with increased time when home alone for ADLs and IADLs. works from home on [place]: Did the patient need help bathing, dressing, using the toilet or eating?  Independent  Indoor Mobility: Did the patient need assistance with walking from room to room (with or without device)? Independent  Stairs: Did the patient need assistance with internal or external stairs (with or without device)? Needed some help  Functional Cognition: Did the patient need help planning regular tasks such as shopping or remembering to take medications? Independent  Home Assistive Devices / Equipment Home Assistive Devices/Equipment: None  Home Equipment: Walker - 4 wheels,Cane - quad,Grab bars - toilet,Shower seat,Grab bars - tub/shower,Hand held shower head,Bedside commode  Prior Device Use: Indicate devices/aids used by the patient prior to current illness, exacerbation or injury? None of the above  Current Functional Level Cognition  Overall Cognitive Status: Within Functional Limits for tasks assessed Orientation Level: Oriented X4    Extremity Assessment (includes Sensation/Coordination)  Upper Extremity Assessment: Overall WFL for tasks assessed  Lower Extremity Assessment: Defer to PT evaluation,RLE deficits/detail RLE Deficits / Details: reports pain with input to the sole RLE: Unable to fully assess due to pain RLE Sensation:  (pt reports numbness) RLE Coordination: decreased fine motor    ADLs  Overall ADL's : Needs assistance/impaired Eating/Feeding: Independent Eating/Feeding Details (indicate cue type and reason): reports nausea with all po intake. reports only keeping cracks and graham crackers down Grooming: Wash/dry hands,Set up,Sitting Grooming Details (indicate cue type and reason): pt able to complete  ~15 minutes of standing activity at sink with weight shift to L foot and use of RW. pt able to release with bil UE and when reaching up for hair with immediate R LOB and Max (A) to catch. Lower Body Dressing: Total assistance Toilet Transfer: Minimal assistance,Ambulation,BSC,RW Toilet Transfer Details (indicate cue type and reason): pt on BSC upon arrival to room, minA for mobility around EOB to recliner, increased time/effort for mobility tasks Toileting- Clothing Manipulation and Hygiene: Sit to/from stand,Minimal assistance Toileting - Clothing Manipulation Details (indicate cue type and reason): pt performing anterior and posterior pericare in standing; minA for standing balance, increased time to perform Functional mobility during ADLs: Minimal assistance,Rolling walker General ADL Comments: pt sliding pillow on the floor under R foot    Mobility  Overal bed mobility: Needs Assistance Bed Mobility: Sit to Supine Supine to sit: Min assist Sit to supine: Mod assist,HOB elevated General bed mobility comments: OOB on BSC upon arrival    Transfers  Overall transfer level: Needs assistance Equipment used: Rolling walker (2 wheeled) Transfers: Sit to/from Stand Sit to Stand: Min assist General transfer comment: Min assist for power up, rise and steady. standing from [facility]; increased time/effort to perform    Ambulation / Gait / Stairs / Wheelchair Mobility  Ambulation/Gait Ambulation/Gait assistance: Herbalist (Feet): 10 Feet (+3) Assistive device: Rolling walker (2 wheeled) Gait Pattern/deviations: Step-to pattern,Decreased step length - right,Decreased step length - left,Antalgic,Trunk flexed General Gait Details: Min assist to steady, guide RW. Pt requesting use of pillow under RLE to offweight and serve as a "ski" as pt is having difficulty performing hip and knee flexion during swing phase of gait. Pt ambulatory to bathroom, then walked 3 ft to sink before getting near  syncopal requiring PT emergently calling RN staff into room to support pt with chair. Pt did not become completely syncopal. Gait velocity: decr Gait velocity interpretation: <1.31 ft/sec, indicative of household ambulator    Posture / Balance Balance Overall balance assessment: Needs assistance Sitting-balance support: Bilateral upper extremity supported,Feet supported Sitting balance-Leahy Scale: Fair Standing balance support: Bilateral upper extremity supported,During functional activity Standing balance-Leahy Scale: Poor Standing balance comment: reliant on at least single UE support and external assist    Special needs/care consideration Skin: Surgical Incision     Previous Home Environment (from acute therapy documentation) Living Arrangements: Parent Available Help at Discharge: Family,Available 24 hours/day Type of Home: House Home Layout: One level Home Access: Stairs to enter Entrance Stairs-Rails: Right,Left Entrance Stairs-Number of Steps: 2 (back deck has been  redone to be more steps with shorter rise, maybe 15 stairs) Bathroom Shower/Tub: Optometrist: Yes Home Care Services: No Additional Comments: 27 pound cat name Brewing technologist  (real name [name]) / power chair at home  Discharge Living Setting Plans for Discharge Living Setting: Patient's home Type of Home at Discharge: House Discharge Home Layout: One level Discharge Home Access: Stairs to enter Entrance Stairs-Rails: Right,Left Entrance Stairs-Number of Steps: 2 Discharge Bathroom Shower/Tub: Tub/shower unit Discharge Bathroom Toilet: Standard Discharge Bathroom Accessibility: Yes How Accessible: Accessible via wheelchair,Accessible via walker Does the patient have any problems obtaining your medications?: No  Social/Family/Support Systems Patient Roles: Parent Contact Information: [phone] Anticipated Caregiver: Desma Paganini Anticipated Caregiver's  Contact Information: [phone] Ability/Limitations of Caregiver: Pt's father is 80 but in very good health Caregiver Availability: 24/7 Discharge Plan Discussed with Primary Caregiver: Yes Is Caregiver In Agreement with Plan?: Yes Does Caregiver/Family have Issues with Lodging/Transportation while Pt is in Rehab?: No   Goals Patient/Family Goal for Rehab: PT/OT supervision/min A Expected length of stay: 14-17 days Pt/Family Agrees to Admission and willing to participate: Yes Program Orientation Provided & Reviewed with Pt/Caregiver Including Roles  & Responsibilities: Yes   Decrease burden of Care through IP rehab admission: Specialzed equipment needs, Decrease number of caregivers, Bowel and bladder program and Patient/family education   Possible need for SNF placement upon discharge: Not anticipated   Patient Condition: This patient's medical and functional status has changed since the consult dated: 08/14/2020 in which the Rehabilitation Physician determined and documented that the patient's condition is appropriate for intensive rehabilitative care in an inpatient rehabilitation facility. See "History of Present Illness" (above) for medical update. Functional changes are: pt has been evaluated by OT with recommendations for CIR; pt has progressed in transfers from mod A to Min A and progressed in ambulation distance from Mod A for 3 feet with Min A for 10 feet (+3). Patient's medical and functional status update has been discussed with the Rehabilitation physician and patient remains appropriate for inpatient rehabilitation. Will admit to inpatient rehab 08/18/20.  Preadmission Screen Completed By:  Raechel Ache, OT, 08/18/2020 10:06 AM  With day of admit updates completed by Raechel Ache, OTR/L  ______________________________________________________________________   Discussed status with Dr. Posey Pronto on 08/18/20 at 10:05AM and received approval for admission today.  Admission  Coordinator:  Raechel Ache, With day of admit updates completed by Raechel Ache, OTR/L at time 10:05AM/Date 08/18/20.

## 2020-08-14 NOTE — Consult Note (Signed)
Physical Medicine and Rehabilitation Consult Reason for Consult: Impaired mobility and ADLs secondary to distal spinal cord injury after attempted spinal cord stimulator placement Referring Physician: Sherley Bounds, MD   HPI: Vanessa Medina is a 49 y.o. female who suffered a spinal cord injury in her distal cord during an attempted spinal cord stimulator placement. She now has weakness and neuropathic pain in her right leg. Physical Medicine & Rehabilitation was consulted to assess candidacy for CIR. Patient is agreeable to pursuing CIR to improve her strength, balance, and ability to perform ADLs.    Review of Systems  Constitutional: Negative.   HENT: Negative.   Eyes: Negative.   Respiratory: Negative.   Cardiovascular: Negative.   Gastrointestinal: Negative.   Genitourinary: Negative.   Musculoskeletal: Positive for back pain and joint pain.  Skin: Negative.   Neurological: Positive for tingling, sensory change and headaches.  Endo/Heme/Allergies: Negative.   Psychiatric/Behavioral: Positive for depression.   Past Medical History:  Diagnosis Date  . Anemia   . Cervical cancer ([place])   . Family history of adverse reaction to anesthesia     " my mother takes a long time time wake up"  . GERD (gastroesophageal reflux disease)   . Headache    migraine  . History of shingles   . Hypertension   . Migraine   . Multiple allergies   . Obesity   . Osteoarthritis    left hip  . PONV (postoperative nausea and vomiting)   . Sleep apnea    does not wear CPAP  . Wears glasses    Past Surgical History:  Procedure Laterality Date  . ABDOMINAL HYSTERECTOMY    . APPENDECTOMY    . BILIOPANCREATIC DIVERSION     with duodenal switch laparoscopic   . CHOLECYSTECTOMY    . DIAGNOSTIC LAPAROSCOPY     LOA  . ESOPHAGOGASTRODUODENOSCOPY (EGD) WITH PROPOFOL N/A 07/25/2017   Procedure: ESOPHAGOGASTRODUODENOSCOPY (EGD) WITH PROPOFOL;  Surgeon: Manya Silvas, MD;  Location: [facility]  ENDOSCOPY;  Service: Endoscopy;  Laterality: N/A;  . KNEE ARTHROSCOPY    . LAPAROSCOPIC GASTRIC SLEEVE RESECTION WITH HIATAL HERNIA REPAIR    . TONSILLECTOMY    . TOTAL HIP ARTHROPLASTY Left 01/26/2016   Procedure: LEFT TOTAL HIP ARTHROPLASTY ANTERIOR APPROACH;  Surgeon: Leandrew Koyanagi, MD;  Location: [place];  Service: Orthopedics;  Laterality: Left;  . TRANSFORAMINAL LUMBAR INTERBODY FUSION (TLIF) WITH PEDICLE SCREW FIXATION 3 LEVEL  08/2019   Basil Dess, MD L2-L5   Family History  Problem Relation Age of Onset  . Renal Disease Mother   . Hypertension Mother   . Sudden Cardiac Death Mother   . Heart failure Mother   . Valvular heart disease Mother   . Heart disease Mother   . Stroke Brother   . Heart attack Brother 66  . Diabetes Other   . Breast cancer Cousin        paternal side   Social History:  reports that she has never smoked. She has never used smokeless tobacco. She reports current alcohol use. She reports that she does not use drugs. Allergies:  Allergies  Allergen Reactions  . Shellfish Allergy Anaphylaxis  . Meperidine Hcl Other (See Comments)    BRADYCARDIA  . Morphine Other (See Comments)    BRADYCARDIA  . Bee Pollen Other (See Comments)    Unknown  . Ivp Dye [Iodinated Diagnostic Agents] Other (See Comments)    Swelling   . Oxycodone Hives and Itching  Blisters on back  . Propoxyphene Nausea Only and Nausea And Vomiting  . Codeine Nausea And Vomiting  . Oxycodone-Acetaminophen Itching  . Pentazocine Lactate Nausea And Vomiting    REACTION: vomiting with [place] NX  . Propoxyphene N-Acetaminophen Nausea And Vomiting   Facility-Administered Medications Prior to Admission  Medication Dose Route Frequency Provider Last Rate Last Admin  . diphenhydrAMINE (BENADRYL) capsule 25 mg  25 mg Oral Q6H PRN Reece Agar, MD       Medications Prior to Admission  Medication Sig Dispense Refill  . fludrocortisone (FLORINEF) 0.1 MG tablet Take 1 tablet (0.1 mg total)  by mouth daily. 30 tablet 5  . Lifitegrast (XIIDRA) 5 % SOLN Place 1 drop into both eyes in the morning and at bedtime.     . midodrine (PROAMATINE) 10 MG tablet Take 1 tablet (10 mg total) by mouth 3 (three) times daily. 270 tablet 3  . pramipexole (MIRAPEX) 0.125 MG tablet Take 0.5 mg by mouth at bedtime.    . topiramate (TOPAMAX) 25 MG tablet Take 50 mg by mouth at bedtime.    . celecoxib (CELEBREX) 200 MG capsule TAKE 1 CAPSULE BY MOUTH  TWICE DAILY (Patient not taking: No sig reported) 180 capsule 3  . HYDROcodone-acetaminophen (NORCO/VICODIN) 5-325 MG tablet Take 1 tablet by mouth every 6 (six) hours as needed for moderate pain. (Patient not taking: Reported on 08/12/2020) 30 tablet 0    Home: Home Living Family/patient expects to be discharged to:: Private residence Living Arrangements: Parent Available Help at Discharge: Family,Available 24 hours/day Type of Home: House Home Access: Stairs to enter [employer] of Steps: 2 (back deck has been redone to be more steps with shorter rise, maybe 15 stairs) Entrance Stairs-Rails: Right,Left Home Layout: One level Bathroom Shower/Tub: Chiropodist: Standard Bathroom Accessibility: Yes Home Equipment: Walker - 4 wheels,Cane - quad,Grab bars - toilet,Shower seat,Grab bars - tub/shower,Hand held shower head,Bedside commode  Functional History: Prior Function Level of Independence: Independent Comments: after removal of trial SCS, pt reports 2 major falls in last month, bruising to R knee after fall. LOC unsure about hitting head. independent with increased time when home alone for ADLs and IADLs. works from home on [employer], quantity Status:  Mobility: Bed Mobility Overal bed mobility: Needs Assistance Bed Mobility: Supine to Sit,Sit to Supine Supine to sit: Min assist Sit to supine: Min assist General bed mobility comments: minA to manage RLE with pillow. pt able to move well without significant  assist Transfers Overall transfer level: Needs assistance Equipment used: Rolling walker (2 wheeled) Transfers: Sit to/from Stand Sit to Stand: Mod assist General transfer comment: modA to power up and steady with pillow under RLE to reduce contact and pain. significant reliance on BUE on RW Ambulation/Gait Ambulation/Gait assistance: Mod assist Gait Distance (Feet): 3 Feet Assistive device: Rolling walker (2 wheeled) Gait Pattern/deviations: Step-to pattern,Decreased stride length General Gait Details: small lateral steps with modA to steady, manage RW, and verbal cues for each step, pt able to slide RLE laterally on pillow and move LLE with increased WB through UE Gait velocity: decreased Gait velocity interpretation: <1.31 ft/sec, indicative of household ambulator    ADL:    Cognition: Cognition Overall Cognitive Status: Within Functional Limits for tasks assessed Orientation Level: Oriented X4 Cognition Arousal/Alertness: Awake/alert Behavior During Therapy: WFL for tasks assessed/performed Overall Cognitive Status: Within Functional Limits for tasks assessed  Blood pressure 137/68, pulse (!) 49, temperature (!) 97.5 F (36.4 C), temperature source Oral, resp.  rate 19, height 5\' 4"  (1.626 m), weight 109.3 kg, SpO2 92 %. Physical Exam  General: Alert and oriented x 3, No apparent distress HEENT: Head is normocephalic, atraumatic, PERRLA, EOMI, sclera anicteric, oral mucosa pink and moist, dentition intact, ext ear canals clear,  Neck: Supple without JVD or lymphadenopathy Heart: Reg rate and rhythm. No murmurs rubs or gallops Chest: CTA bilaterally without wheezes, rales, or rhonchi; no distress Abdomen: Soft, non-tender, non-distended, bowel sounds positive. Extremities: No clubbing, cyanosis, or edema. Pulses are 2+ Skin: Clean and intact without signs of breakdown Neuro: Pt is cognitively appropriate with normal insight, memory, and awareness. Cranial nerves 2-12 are  intact. Significant weakness RLE, currently limited by pain.  Psych: Pt's affect is appropriate. Pt is cooperative   No results found for this or any previous visit (from the past 24 hour(s)). No results found.   Assessment/Plan: Diagnosis: Acute spinal cord injury, T12-L1 1. Does the need for close, 24 hr/day medical supervision in concert with the patient's rehab needs make it unreasonable for this patient to be served in a less intensive setting? Yes 2. Co-Morbidities requiring supervision/potential complications: cervical cancer, morbid obesity (BMI 41.36), depression, common migraine, asthma 3. Due to bladder management, bowel management, safety, skin/wound care, disease management, medication administration, pain management and patient education, does the patient require 24 hr/day rehab nursing? Yes 4. Does the patient require coordinated care of a physician, rehab nurse, therapy disciplines of PT, OT to address physical and functional deficits in the context of the above medical diagnosis(es)? Yes Addressing deficits in the following areas: balance, endurance, locomotion, strength, transferring, bowel/bladder control, bathing, dressing, feeding, grooming, toileting and psychosocial support 5. Can the patient actively participate in an intensive therapy program of at least 3 hrs of therapy per day at least 5 days per week? Yes 6. The potential for patient to make measurable gains while on inpatient rehab is excellent 7. Anticipated functional outcomes upon discharge from inpatient rehab are modified independent  with PT, modified independent with OT, independent with SLP. 8. Estimated rehab length of stay to reach the above functional goals is: 10-14 days 9. Anticipated discharge destination: Home 10. Overall Rehab/Functional Prognosis: excellent  RECOMMENDATIONS: This patient's condition is appropriate for continued rehabilitative care in the following setting: CIR Patient has agreed  to participate in recommended program. Yes Note that insurance prior authorization may be required for reimbursement for recommended care.  Comment:  1) Acute spinal cord injury T12-L1: Continue acute PT and OT 2) Right lower extremity neuropathic pain: Currently receiving IV dilaudid and would need to be transitioned to po medication prior to CIR. Can consider increasing Gabapentin to 400mg  TID.  3) Right lower extremity weakness affecting mobility and ADLs: admit to CIR once insurance approval obtained and bed available and no longer requiring IV dilaudid.  4) Urinary retention: appreciate nursing encouragement and education regarding importance of voiding, continue encouraging bedside commode.   Thank you for this consult. Admission coordinator to follow.   I have personally performed a face to face diagnostic evaluation, including, but not limited to relevant history and physical exam findings, of this patient and developed relevant assessment and plan.  Additionally, I have reviewed and concur with the physician assistant's documentation above.  Leeroy Cha, MD

## 2020-08-15 MED ORDER — DEXAMETHASONE SODIUM PHOSPHATE 4 MG/ML IJ SOLN
2.0000 mg | Freq: Four times a day (QID) | INTRAMUSCULAR | Status: DC
  Administered 2020-08-15 – 2020-08-18 (×14): 2 mg via INTRAVENOUS
  Filled 2020-08-15 (×14): qty 1

## 2020-08-15 NOTE — Progress Notes (Signed)
She is doing the same except I think she as a little more movement in her leg, esp proximally, can bend her knee up, 2/5 wiggle and DF/PF, wean decadron, PT/OT/ rehab

## 2020-08-16 MED ORDER — GABAPENTIN 400 MG PO CAPS
400.0000 mg | ORAL_CAPSULE | Freq: Three times a day (TID) | ORAL | Status: DC
  Administered 2020-08-16 – 2020-08-18 (×7): 400 mg via ORAL
  Filled 2020-08-16 (×7): qty 1

## 2020-08-16 MED ORDER — METHOCARBAMOL 750 MG PO TABS: 750.0000 mg | ORAL_TABLET | Freq: Four times a day (QID) | ORAL | Status: DC | PRN

## 2020-08-16 NOTE — Progress Notes (Signed)
Physical Therapy Treatment Patient Details Name: Vanessa Medina MRN: [id number] DOB: 10-04-1970 Today's Date: 08/16/2020    History of Present Illness The pt is a 49 yo female presenting after acute onset weakness and pain in her RLE in pacu at surgical center for attempted spinal cord stimulator placement. PMH includes: multiple spine surgeries and multi-level fusion, with most recent operation one year ago.  Anemia, GERD, HTN, obesity, and chronic back pain.    PT Comments    The pt is continuing to make good progress with skilled PT at this point, she was able to progress ambulation distance by completing two short bouts of stepping/hopping (< 5 ft) with use of RW and minA to maintain balance. The pt remains limited by power in her LLE, dynamic stability to complete tasks without significant UE support, endurance, and pain at this time resulting in significantly limited mobility and independence compared to her prior level of mobility and function. The pt remains highly motivated to progress activity and strength to facilitate return to prior level of independence.    Follow Up Recommendations  CIR     Equipment Recommendations   (defer to post acute)    Recommendations for Other Services       Precautions / Restrictions Precautions Precautions: Fall Restrictions Weight Bearing Restrictions: No    Mobility  Bed Mobility Overal bed mobility: Needs Assistance Bed Mobility: Supine to Sit     Supine to sit: Min assist     General bed mobility comments: minA to manage RLE in bed, with heavy use of bed rail with slightly elevated HOB, pt able to scoot with increased time  Transfers Overall transfer level: Needs assistance Equipment used: Rolling walker (2 wheeled) Transfers: Sit to/from Stand Sit to Stand: Mod assist         General transfer comment: modA to power up and steady with pillow under RLE to reduce contact and pain. significant reliance on BUE on  RW  Ambulation/Gait Ambulation/Gait assistance: Min assist Gait Distance (Feet): 3 Feet (+ 57ft) Assistive device: Rolling walker (2 wheeled) Gait Pattern/deviations: Step-to pattern;Decreased stride length Gait velocity: decreased Gait velocity interpretation: <1.31 ft/sec, indicative of household ambulator General Gait Details: small lateral steps/hops with LLE with heavy UE support on RW. pt with RLE on pillow to reduce contact and pressure, able to slide on pillow but not accept any weight due to pain. minA for balance, RW management, and safety.      Balance Overall balance assessment: Needs assistance Sitting-balance support: No upper extremity supported;Feet supported Sitting balance-Leahy Scale: Fair     Standing balance support: Bilateral upper extremity supported;During functional activity Standing balance-Leahy Scale: Poor Standing balance comment: reliant on UE support                            Cognition Arousal/Alertness: Awake/alert Behavior During Therapy: WFL for tasks assessed/performed Overall Cognitive Status: Within Functional Limits for tasks assessed                                           General Comments General comments (skin integrity, edema, etc.): VSS on RA, pt able to manage pain with significant increased time and repeated verbal cues for technique      Pertinent Vitals/Pain Pain Assessment: Faces Faces Pain Scale: Hurts even more Pain Location: inside of RLE and  foot Pain Descriptors / Indicators: Numbness;Sharp Pain Intervention(s): Limited activity within patient's tolerance;Monitored during session;Repositioned           PT Goals (current goals can now be found in the care plan section) Acute Rehab PT Goals PT Goal Formulation: With patient Time For Goal Achievement: 08/28/20 Potential to Achieve Goals: Good Progress towards PT goals: Progressing toward goals    Frequency    Min 4X/week       PT Plan Current plan remains appropriate       AM-PAC PT "6 Clicks" Mobility   Outcome Measure  Help needed turning from your back to your side while in a flat bed without using bedrails?: A Little Help needed moving from lying on your back to sitting on the side of a flat bed without using bedrails?: A Little Help needed moving to and from a bed to a chair (including a wheelchair)?: A Lot Help needed standing up from a chair using your arms (e.g., wheelchair or bedside chair)?: A Lot Help needed to walk in hospital room?: A Lot Help needed climbing 3-5 steps with a railing? : Total 6 Click Score: 13    End of Session Equipment Utilized During Treatment: Gait belt Activity Tolerance: Patient limited by pain Patient left: in chair;with call bell/phone within reach Nurse Communication: Mobility status PT Visit Diagnosis: Other abnormalities of gait and mobility (R26.89);Pain Pain - Right/Left: Right Pain - part of body: Leg     Time: 0830-0900 PT Time Calculation (min) (ACUTE ONLY): 30 min  Charges:  $Gait Training: 8-22 mins $Therapeutic Activity: 8-22 mins                     Karma Ganja, PT, DPT   Acute Rehabilitation Department Pager #: [phone]   Otho Bellows 08/16/2020, 9:14 AM

## 2020-08-16 NOTE — Progress Notes (Signed)
Patient is seen today.  Feels if she has more movement in her right leg than previous.  Pain is still a bit of an issue specifically with touching her leg or any attempt to ambulate.  She is also noticing more of a constant type headache as frontal in location.  There is some positional component however it is always present.  She does have a history of migraines as well however this is little bit different.  She is tolerating her medications.  She did note 1 issue yesterday where she became somnolent after a dose of the medication.   She feels that she is receiving benefit from her   Hydromorphone without any adverse effects.   Dr. Martha Clan has seen her for a possible transfer to rehabilitation.  I have reviewed his notes as well as those from Dr. Ronnald Ramp.  1.  Increase gabapentin to 400mg  PO TID today to help with neuropathic pains.  2.  D/C IV robaxin, start PO.  3.  Continue Hydromophone  - her nurse was not sure what made her drowsy yesterday, but states she's tolerating the hydromorphone PO, not using IV.  4.  Headache likely multifactorial, will monitor for now, may need occipital nerve blocks; would not do blood patch due to access difficulty and SCI injury.  5.  Cont to follow.

## 2020-08-16 NOTE — Progress Notes (Signed)
Pt. nausea has been better today. Was able to eat more today. Pt. Is c/o some sinus and ear pressure. Pt. Stated she my be getting an ear infection. MD was informed of this concern.

## 2020-08-16 NOTE — Progress Notes (Signed)
She is doing about the same, headaches, R leg allodynia, increasing mvmt, incision is CDI and not swollen. Continue PT/OT. Do not see a csf leak but could be. [place] following.

## 2020-08-16 NOTE — Progress Notes (Signed)
Inpatient Rehab Admissions Coordinator:   I opened a case with [facility] for CIR admit today. I anticipate a response tomorrow or Wednesday. Pt. Was notified. I also spoke wit her father who confirmed 24/7 support.   Clemens Catholic, [place], [place] Admissions Coordinator  [phone] ([place]) [phone] (office)

## 2020-08-16 NOTE — Evaluation (Signed)
Occupational Therapy Evaluation Patient Details Name: Vanessa Medina MRN: [id number] DOB: [birth year] Today's Date: 08/16/2020    History of Present Illness The pt is a 49 yo female presenting after acute onset weakness and pain in her RLE in pacu at surgical center for attempted spinal cord stimulator placement. PMH includes: multiple spine surgeries and multi-level fusion, with most recent operation one year ago.  Anemia, GERD, HTN, obesity, and chronic back pain.   Clinical Impression   PT admitted with R LE weakness. Pt currently with functional limitiations due to the deficits listed below (see OT problem list). Pt currently limited by pain in RLE that affects balance for all activities. Pt very motivated to complete self care this session. Pt requesting to be allowed to take shower. Please order when showers are appropriate and IV wrapping of L UE to keep it dry. Pt with staples noted on back this session dry and intact.  Pt will benefit from skilled OT to increase their independence and safety with adls and balance to allow discharge CIR.      Follow Up Recommendations  CIR    Equipment Recommendations  3 in 1 bedside commode;Other (comment) (RW)    Recommendations for Other Services Rehab consult     Precautions / Restrictions Precautions Precautions: Fall Precaution Comments: watch BP      Mobility Bed Mobility Overal bed mobility: Needs Assistance Bed Mobility: Sit to Supine       Sit to supine: Mod assist   General bed mobility comments: pt requires (A) for BIL LE back on to bed surface    Transfers Overall transfer level: Needs assistance Equipment used: Rolling walker (2 wheeled) Transfers: Sit to/from Stand Sit to Stand: Min assist;From elevated surface         General transfer comment: requires use of bil UE to push up into standing. Pt with weight shift onto L side    Balance Overall balance assessment: Needs assistance Sitting-balance  support: Bilateral upper extremity supported;Feet supported Sitting balance-Leahy Scale: Fair     Standing balance support: Bilateral upper extremity supported;During functional activity Standing balance-Leahy Scale: Poor Standing balance comment: reliant on UE support                           ADL either performed or assessed with clinical judgement   ADL Overall ADL's : Needs assistance/impaired Eating/Feeding: Independent Eating/Feeding Details (indicate cue type and reason): reports nausea with all po intake. reports only keeping cracks and graham crackers down Grooming: Wash/dry face;Wash/dry hands;Oral care;Brushing hair;Minimal assistance;Standing Grooming Details (indicate cue type and reason): pt able to complete ~15 minutes of standing activity at sink with weight shift to L foot and use of RW. pt able to release with bil UE and when reaching up for hair with immediate R LOB and Max (A) to catch.             Lower Body Dressing: Total assistance   Toilet Transfer: Minimal assistance;BSC;RW   Toileting- Clothing Manipulation and Hygiene: Minimal assistance;Sit to/from stand Toileting - Clothing Manipulation Details (indicate cue type and reason): pt static standing for peri care     Functional mobility during ADLs: Min guard;Rolling walker General ADL Comments: pt sliding pillow on the floor under R foot     Vision         Perception     Praxis      Pertinent Vitals/Pain Pain Assessment: Faces Faces Pain Scale: Hurts little  more Pain Location: inside of RLE and foot Pain Descriptors / Indicators: Discomfort;Sore Pain Intervention(s): Monitored during session;Repositioned     Hand Dominance Right   Extremity/Trunk Assessment Upper Extremity Assessment Upper Extremity Assessment: Overall WFL for tasks assessed   Lower Extremity Assessment Lower Extremity Assessment: Defer to PT evaluation;RLE deficits/detail RLE Deficits / Details: reports  pain with input to the sole   Cervical / Trunk Assessment Cervical / Trunk Assessment: Other exceptions (s/p surg with staples still in place)   Communication Communication Communication: No difficulties   Cognition Arousal/Alertness: Awake/alert Behavior During Therapy: WFL for tasks assessed/performed Overall Cognitive Status: Within Functional Limits for tasks assessed                                     General Comments  staple in place    Exercises     Shoulder Instructions      Home Living Family/patient expects to be discharged to:: Private residence Living Arrangements: Parent Available Help at Discharge: Family;Available 24 hours/day Type of Home: House Home Access: Stairs to enter [employer] of Steps: 2 (back deck has been redone to be more steps with shorter rise, maybe 15 stairs) Entrance Stairs-Rails: Right;Left Home Layout: One level     Bathroom Shower/Tub: Teacher, early years/pre: Standard Bathroom Accessibility: Yes   Home Equipment: Walker - 4 wheels;Cane - quad;Grab bars - toilet;Shower seat;Grab bars - tub/shower;Hand held shower head;Bedside commode   Additional Comments: 27 pound cat name Butterbean  (real name [name]) / power chair at home      Prior Functioning/Environment Level of Independence: Independent        Comments: after removal of trial SCS, pt reports 2 major falls in last month, bruising to R knee after fall. LOC unsure about hitting head. independent with increased time when home alone for ADLs and IADLs. works from home on computer        OT Problem List: Decreased activity tolerance;Impaired balance (sitting and/or standing);Decreased safety awareness;Decreased knowledge of use of DME or AE;Decreased knowledge of precautions;Pain      OT Treatment/Interventions: Self-care/ADL training;Therapeutic exercise;Neuromuscular education;Energy conservation;DME and/or AE instruction;Manual  therapy;Therapeutic activities;Balance training;Patient/family education    OT Goals(Current goals can be found in the care plan section) Acute Rehab OT Goals Patient Stated Goal: to be home by Christmas OT Goal Formulation: With patient Time For Goal Achievement: 08/30/20 Potential to Achieve Goals: Good  OT Frequency: Min 2X/week   Barriers to D/C:            Co-evaluation              AM-PAC OT "6 Clicks" Daily Activity     Outcome Measure Help from another person eating meals?: None Help from another person taking care of personal grooming?: A Little Help from another person toileting, which includes using toliet, bedpan, or urinal?: A Little Help from another person bathing (including washing, rinsing, drying)?: A Little Help from another person to put on and taking off regular upper body clothing?: A Little Help from another person to put on and taking off regular lower body clothing?: A Lot 6 Click Score: 18   End of Session Equipment Utilized During Treatment: Gait belt;Rolling walker Nurse Communication: Mobility status;Precautions  Activity Tolerance: Patient tolerated treatment well Patient left: in bed;with call bell/phone within reach;with bed alarm set  OT Visit Diagnosis: Unsteadiness on feet (R26.81);Muscle weakness (generalized) (M62.81);Pain  Pain - Right/Left: Right Pain - part of body: Leg                Time: 1251-1347 OT Time Calculation (min): 56 min Charges:  OT General Charges $OT Visit: 1 Visit OT Evaluation $OT Eval Moderate Complexity: 1 Mod OT Treatments $Self Care/Home Management : 38-52 mins   Brynn, OTR/L  Acute Rehabilitation Services Pager: [phone] Office: [phone] .   Jeri Modena 08/16/2020, 2:13 PM

## 2020-08-17 ENCOUNTER — Other Ambulatory Visit: Payer: Self-pay

## 2020-08-17 MED ORDER — PANTOPRAZOLE SODIUM 20 MG PO TBEC
20.0000 mg | DELAYED_RELEASE_TABLET | Freq: Every day | ORAL | Status: DC
  Administered 2020-08-17 – 2020-08-18 (×2): 20 mg via ORAL
  Filled 2020-08-17 (×2): qty 1

## 2020-08-17 MED ORDER — METOCLOPRAMIDE HCL 10 MG PO TABS
10.0000 mg | ORAL_TABLET | Freq: Three times a day (TID) | ORAL | Status: DC
  Administered 2020-08-17 – 2020-08-18 (×4): 10 mg via ORAL
  Filled 2020-08-17 (×4): qty 1

## 2020-08-17 NOTE — Progress Notes (Signed)
Subjective: Patient reports did getup with therapy yesterday.  She has slowly making improvements in her leg movement. Objective: Vital signs in last 24 hours: Temp:  [97.4 F (36.3 C)-98.3 F (36.8 C)] 97.4 F (36.3 C) (12/14 0806) Pulse Rate:  [37-89] 88 (12/14 0806) Resp:  [20] 20 (12/14 0806) BP: (142-166)/(70-77) 142/72 (12/14 0806) SpO2:  [90 %-99 %] 98 % (12/14 0806)  Intake/Output from previous day: 12/13 0701 - 12/14 0700 In: 900 [P.O.:300; I.V.:600] Out: -  Intake/Output this shift: No intake/output data recorded.  Neurologic: Grossly normal  Right lower extremity 2/5  Lab Results: Lab Results  Component Value Date   WBC 4.9 08/11/2020   HGB 12.6 08/11/2020   HCT 37.0 08/11/2020   MCV 99.5 08/11/2020   PLT 212 08/11/2020   Lab Results  Component Value Date   INR 1.0 08/22/2019   BMET Lab Results  Component Value Date   NA 138 08/11/2020   K 3.8 08/11/2020   CL 110 08/11/2020   CO2 19 (L) 08/11/2020   GLUCOSE 110 (H) 08/11/2020   BUN 44 (H) 08/11/2020   CREATININE 3.40 (H) 08/11/2020   CALCIUM 9.4 08/11/2020    Studies/Results: No results found.  Assessment/Plan:  slowly progressing.  Work with therapy today she is moving her leg more more every day.  We are waiting insurance approval on CIR   LOS: 5 days    Ocie Cornfield [facility] 08/17/2020, 10:33 AM

## 2020-08-17 NOTE — Progress Notes (Signed)
Patient seen.  Continued pain in RLE, slight improvement.  More movement.  Patient worked with OT.  Complains of headache, some what similar to migraines, though now constant.  No visual changes.  Decreased hearing in right ear as compared to left.  Also noted some sinus drainage that was green.  No fevers.   Patient has not had a bowel movement since being in hospital, however, she is passing gas.  Also notes some nausea when she eats along with mild mid epigastric pain.  Exam: Alert, appropriate, sitting upright in chair Utilizing otoscope, cerumen impaction noted in right ear as compare to left, from what can be seen of membrane, appears clear. Able to grossly move RLE,  Incision c/d/i, staples in place  A/P Pleasant patient with RLE pain, weakness, slowly improving  1.  Hearing decrease - suspect due to cerumen impaction, discussed saline lavage with nursing. Will re-evaluate if persists.  2.  Nausea - will add reglan given lack of bowl movement as well.   3.  Headache - can have a component of spinal headache, though able to tolerate sitting up for prolonged periods of time.  Continue tylenol for now.  4.  Epigastric pain - likely reflux, patient has been on steroids as well, will add protonix.

## 2020-08-17 NOTE — Progress Notes (Signed)
Physical Therapy Treatment Patient Details Name: Vanessa Medina MRN: [id number] DOB: 10-07-1970 Today's Date: 08/17/2020    History of Present Illness The pt is a 49 yo female presenting after acute onset weakness and pain in her RLE in pacu at surgical center for attempted spinal cord stimulator placement. PMH includes: multiple spine surgeries and multi-level fusion, with most recent operation one year ago.  Anemia, GERD, HTN, obesity, and chronic back pain.    PT Comments    Pt up in chair upon PT arrival to room, requesting trip to bathroom. Pt ambulatory with use of RW and assist to steady and navigate RW. Pt continues to be significantly limited by RLE pain, and continues to request using a pillow under R foot during standing activity to decrease WB and aid in RLE translation. Of note, Pt ambulatory to bathroom, then walked 3 ft to sink before getting near syncopal requiring PT emergently calling RN staff into room to support pt with chair. Pt did not become completely syncopal. PT to continue to follow acutely, pt left in care of RN at end of session.     Follow Up Recommendations  CIR     Equipment Recommendations   (defer to post acute)    Recommendations for Other Services       Precautions / Restrictions Precautions Precautions: Fall Restrictions Weight Bearing Restrictions: No Other Position/Activity Restrictions: RLE limited by significant pain    Mobility  Bed Mobility Overal bed mobility: Needs Assistance Bed Mobility: Sit to Supine       Sit to supine: Mod assist;HOB elevated   General bed mobility comments: Mod assist for LE lifting into bed, positioning upon return to supine.  Transfers Overall transfer level: Needs assistance Equipment used: Rolling walker (2 wheeled) Transfers: Sit to/from Stand Sit to Stand: Min assist         General transfer comment: Min assist for power up, rise and steady. STS x2, from EOB and  toilet.  Ambulation/Gait Ambulation/Gait assistance: Min assist Gait Distance (Feet): 10 Feet (+3) Assistive device: Rolling walker (2 wheeled) Gait Pattern/deviations: Step-to pattern;Decreased step length - right;Decreased step length - left;Antalgic;Trunk flexed Gait velocity: decr   General Gait Details: Min assist to steady, guide RW. Pt requesting use of pillow under RLE to offweight and serve as a "ski" as pt is having difficulty performing hip and knee flexion during swing phase of gait. Pt ambulatory to bathroom, then walked 3 ft to sink before getting near syncopal requiring PT emergently calling RN staff into room to support pt with chair. Pt did not become completely syncopal.   Stairs             Wheelchair Mobility    Modified Rankin (Stroke Patients Only)       Balance Overall balance assessment: Needs assistance Sitting-balance support: Bilateral upper extremity supported;Feet supported Sitting balance-Leahy Scale: Fair     Standing balance support: Bilateral upper extremity supported;During functional activity Standing balance-Leahy Scale: Poor Standing balance comment: reliant on UE support                            Cognition Arousal/Alertness: Awake/alert Behavior During Therapy: WFL for tasks assessed/performed Overall Cognitive Status: Within Functional Limits for tasks assessed  Exercises      General Comments        Pertinent Vitals/Pain Pain Assessment: Faces Faces Pain Scale: Hurts even more Pain Location: RLE, especially foot Pain Descriptors / Indicators: Discomfort;Sore;Shooting;Other (Comment) ("electric shock") Pain Intervention(s): Limited activity within patient's tolerance;Monitored during session;Repositioned    Home Living Family/patient expects to be discharged to:: Private residence Living Arrangements: Parent                  Prior Function             PT Goals (current goals can now be found in the care plan section) Acute Rehab PT Goals Patient Stated Goal: to be home by Christmas PT Goal Formulation: With patient Time For Goal Achievement: 08/28/20 Potential to Achieve Goals: Good Progress towards PT goals: Progressing toward goals    Frequency    Min 4X/week      PT Plan Current plan remains appropriate    Co-evaluation              AM-PAC PT "6 Clicks" Mobility   Outcome Measure  Help needed turning from your back to your side while in a flat bed without using bedrails?: A Little Help needed moving from lying on your back to sitting on the side of a flat bed without using bedrails?: A Little Help needed moving to and from a bed to a chair (including a wheelchair)?: A Little Help needed standing up from a chair using your arms (e.g., wheelchair or bedside chair)?: A Little Help needed to walk in hospital room?: A Lot Help needed climbing 3-5 steps with a railing? : Total 6 Click Score: 15    End of Session   Activity Tolerance: Patient limited by pain;Patient limited by fatigue;Treatment limited secondary to medical complications (Comment) (near-syncopal episode) Patient left: in chair;with call bell/phone within reach Nurse Communication: Mobility status PT Visit Diagnosis: Other abnormalities of gait and mobility (R26.89);Pain Pain - Right/Left: Right Pain - part of body: Leg     Time: 1719-1803 (6 minutes subtracted from chargeable time for pt toileting) PT Time Calculation (min) (ACUTE ONLY): 44 min  Charges:  $Gait Training: 8-22 mins $Therapeutic Activity: 8-22 mins                     Vanessa Medina, PT Acute Rehabilitation Services Pager [phone]  Office [phone]   Louis Matte 08/17/2020, 6:34 PM

## 2020-08-17 NOTE — Progress Notes (Signed)
Inpatient Rehabilitation-Admissions Coordinator   I have received insurance approval for admit to CIR. I do not have a bed available today but will follow up tomorrow for potential admit, pending bed availability.   Raechel Ache, OTR/L  Rehab Admissions Coordinator  [phone] 08/17/2020 2:10 PM

## 2020-08-18 ENCOUNTER — Inpatient Hospital Stay (HOSPITAL_COMMUNITY)
Admission: RE | Admit: 2020-08-18 | Discharge: 2020-08-31 | DRG: 052 | Disposition: A | Discharge status: Home / Self Care | Payer: 59 | Source: Intra-hospital | Attending: Physical Medicine and Rehabilitation | Admitting: Physical Medicine and Rehabilitation

## 2020-08-18 ENCOUNTER — Encounter (HOSPITAL_COMMUNITY): Payer: Self-pay | Admitting: Physical Medicine and Rehabilitation

## 2020-08-18 ENCOUNTER — Other Ambulatory Visit: Payer: Self-pay

## 2020-08-18 ENCOUNTER — Inpatient Hospital Stay (HOSPITAL_COMMUNITY): Payer: 59

## 2020-08-18 DIAGNOSIS — K219 Gastro-esophageal reflux disease without esophagitis: Secondary | ICD-10-CM | POA: Diagnosis present

## 2020-08-18 DIAGNOSIS — G43909 Migraine, unspecified, not intractable, without status migrainosus: Secondary | ICD-10-CM | POA: Diagnosis present

## 2020-08-18 DIAGNOSIS — Z96642 Presence of left artificial hip joint: Secondary | ICD-10-CM | POA: Diagnosis present

## 2020-08-18 DIAGNOSIS — W19XXXD Unspecified fall, subsequent encounter: Secondary | ICD-10-CM | POA: Diagnosis present

## 2020-08-18 DIAGNOSIS — Z833 Family history of diabetes mellitus: Secondary | ICD-10-CM

## 2020-08-18 DIAGNOSIS — N179 Acute kidney failure, unspecified: Secondary | ICD-10-CM | POA: Diagnosis present

## 2020-08-18 DIAGNOSIS — G8918 Other acute postprocedural pain: Secondary | ICD-10-CM

## 2020-08-18 DIAGNOSIS — N189 Chronic kidney disease, unspecified: Secondary | ICD-10-CM | POA: Diagnosis present

## 2020-08-18 DIAGNOSIS — Z803 Family history of malignant neoplasm of breast: Secondary | ICD-10-CM | POA: Diagnosis not present

## 2020-08-18 DIAGNOSIS — G8220 Paraplegia, unspecified: Secondary | ICD-10-CM

## 2020-08-18 DIAGNOSIS — Z79899 Other long term (current) drug therapy: Secondary | ICD-10-CM

## 2020-08-18 DIAGNOSIS — I129 Hypertensive chronic kidney disease with stage 1 through stage 4 chronic kidney disease, or unspecified chronic kidney disease: Secondary | ICD-10-CM | POA: Diagnosis present

## 2020-08-18 DIAGNOSIS — W19XXXS Unspecified fall, sequela: Secondary | ICD-10-CM | POA: Diagnosis present

## 2020-08-18 DIAGNOSIS — G43709 Chronic migraine without aura, not intractable, without status migrainosus: Secondary | ICD-10-CM | POA: Diagnosis not present

## 2020-08-18 DIAGNOSIS — Z823 Family history of stroke: Secondary | ICD-10-CM | POA: Diagnosis not present

## 2020-08-18 DIAGNOSIS — H02409 Unspecified ptosis of unspecified eyelid: Secondary | ICD-10-CM | POA: Diagnosis present

## 2020-08-18 DIAGNOSIS — S34109S Unspecified injury to unspecified level of lumbar spinal cord, sequela: Secondary | ICD-10-CM

## 2020-08-18 DIAGNOSIS — G903 Multi-system degeneration of the autonomic nervous system: Secondary | ICD-10-CM

## 2020-08-18 DIAGNOSIS — Z981 Arthrodesis status: Secondary | ICD-10-CM

## 2020-08-18 DIAGNOSIS — S8001XD Contusion of right knee, subsequent encounter: Secondary | ICD-10-CM

## 2020-08-18 DIAGNOSIS — S34101S Unspecified injury to L1 level of lumbar spinal cord, sequela: Secondary | ICD-10-CM

## 2020-08-18 DIAGNOSIS — Z6841 Body Mass Index (BMI) 40.0 and over, adult: Secondary | ICD-10-CM

## 2020-08-18 DIAGNOSIS — G8929 Other chronic pain: Secondary | ICD-10-CM | POA: Diagnosis present

## 2020-08-18 DIAGNOSIS — K5901 Slow transit constipation: Secondary | ICD-10-CM

## 2020-08-18 DIAGNOSIS — F4323 Adjustment disorder with mixed anxiety and depressed mood: Secondary | ICD-10-CM | POA: Diagnosis not present

## 2020-08-18 DIAGNOSIS — Z888 Allergy status to other drugs, medicaments and biological substances status: Secondary | ICD-10-CM | POA: Diagnosis not present

## 2020-08-18 DIAGNOSIS — I959 Hypotension, unspecified: Secondary | ICD-10-CM

## 2020-08-18 DIAGNOSIS — Z8619 Personal history of other infectious and parasitic diseases: Secondary | ICD-10-CM

## 2020-08-18 DIAGNOSIS — D649 Anemia, unspecified: Secondary | ICD-10-CM | POA: Diagnosis not present

## 2020-08-18 DIAGNOSIS — R0989 Other specified symptoms and signs involving the circulatory and respiratory systems: Secondary | ICD-10-CM | POA: Diagnosis not present

## 2020-08-18 DIAGNOSIS — E876 Hypokalemia: Secondary | ICD-10-CM

## 2020-08-18 DIAGNOSIS — Z9049 Acquired absence of other specified parts of digestive tract: Secondary | ICD-10-CM | POA: Diagnosis not present

## 2020-08-18 DIAGNOSIS — Z91041 Radiographic dye allergy status: Secondary | ICD-10-CM | POA: Diagnosis not present

## 2020-08-18 DIAGNOSIS — Z841 Family history of disorders of kidney and ureter: Secondary | ICD-10-CM

## 2020-08-18 DIAGNOSIS — M5116 Intervertebral disc disorders with radiculopathy, lumbar region: Secondary | ICD-10-CM | POA: Diagnosis present

## 2020-08-18 DIAGNOSIS — I951 Orthostatic hypotension: Secondary | ICD-10-CM | POA: Diagnosis present

## 2020-08-18 DIAGNOSIS — S24104S Unspecified injury at T11-T12 level of thoracic spinal cord, sequela: Secondary | ICD-10-CM

## 2020-08-18 DIAGNOSIS — G4730 Sleep apnea, unspecified: Secondary | ICD-10-CM | POA: Diagnosis present

## 2020-08-18 DIAGNOSIS — Z90710 Acquired absence of both cervix and uterus: Secondary | ICD-10-CM | POA: Diagnosis not present

## 2020-08-18 DIAGNOSIS — Z8541 Personal history of malignant neoplasm of cervix uteri: Secondary | ICD-10-CM | POA: Diagnosis not present

## 2020-08-18 DIAGNOSIS — M79651 Pain in right thigh: Secondary | ICD-10-CM | POA: Diagnosis not present

## 2020-08-18 DIAGNOSIS — Z8249 Family history of ischemic heart disease and other diseases of the circulatory system: Secondary | ICD-10-CM | POA: Diagnosis not present

## 2020-08-18 DIAGNOSIS — Z9884 Bariatric surgery status: Secondary | ICD-10-CM

## 2020-08-18 DIAGNOSIS — Z634 Disappearance and death of family member: Secondary | ICD-10-CM

## 2020-08-18 DIAGNOSIS — R001 Bradycardia, unspecified: Secondary | ICD-10-CM | POA: Diagnosis present

## 2020-08-18 DIAGNOSIS — K5900 Constipation, unspecified: Secondary | ICD-10-CM | POA: Diagnosis present

## 2020-08-18 DIAGNOSIS — I950 Idiopathic hypotension: Secondary | ICD-10-CM | POA: Diagnosis not present

## 2020-08-18 DIAGNOSIS — I9589 Other hypotension: Secondary | ICD-10-CM | POA: Diagnosis present

## 2020-08-18 DIAGNOSIS — Z885 Allergy status to narcotic agent status: Secondary | ICD-10-CM

## 2020-08-18 DIAGNOSIS — Z91013 Allergy to seafood: Secondary | ICD-10-CM

## 2020-08-18 DIAGNOSIS — R29810 Facial weakness: Secondary | ICD-10-CM | POA: Diagnosis present

## 2020-08-18 MED ORDER — TRAZODONE HCL 50 MG PO TABS
25.0000 mg | ORAL_TABLET | Freq: Every evening | ORAL | Status: DC | PRN
  Administered 2020-08-20 – 2020-08-30 (×6): 50 mg via ORAL
  Filled 2020-08-18 (×8): qty 1

## 2020-08-18 MED ORDER — NALOXONE HCL 0.4 MG/ML IJ SOLN: 0.4000 mg | INTRAMUSCULAR | Status: DC | PRN

## 2020-08-18 MED ORDER — METHOCARBAMOL 750 MG PO TABS: 750.0000 mg | ORAL_TABLET | Freq: Four times a day (QID) | ORAL | 0 refills | Status: DC | PRN

## 2020-08-18 MED ORDER — POLYETHYLENE GLYCOL 3350 17 G PO PACK: 17.0000 g | PACK | Freq: Every day | ORAL | Status: DC | PRN

## 2020-08-18 MED ORDER — TOPIRAMATE 25 MG PO TABS
25.0000 mg | ORAL_TABLET | Freq: Two times a day (BID) | ORAL | Status: DC
  Administered 2020-08-18 – 2020-08-19 (×2): 25 mg via ORAL
  Filled 2020-08-18 (×2): qty 1

## 2020-08-18 MED ORDER — MIDODRINE HCL 5 MG PO TABS
10.0000 mg | ORAL_TABLET | Freq: Three times a day (TID) | ORAL | Status: DC
  Administered 2020-08-19 – 2020-08-20 (×4): 10 mg via ORAL
  Filled 2020-08-18 (×4): qty 2

## 2020-08-18 MED ORDER — METHOCARBAMOL 750 MG PO TABS
750.0000 mg | ORAL_TABLET | Freq: Four times a day (QID) | ORAL | Status: DC | PRN
  Administered 2020-08-20: 750 mg via ORAL
  Filled 2020-08-18: qty 1

## 2020-08-18 MED ORDER — BOOST PLUS PO LIQD
237.0000 mL | Freq: Three times a day (TID) | ORAL | Status: DC
  Filled 2020-08-18 (×3): qty 237

## 2020-08-18 MED ORDER — DEXAMETHASONE SODIUM PHOSPHATE 4 MG/ML IJ SOLN: 4.0000 mg | INTRAMUSCULAR | Status: DC

## 2020-08-18 MED ORDER — GABAPENTIN 400 MG PO CAPS: 400.0000 mg | ORAL_CAPSULE | Freq: Three times a day (TID) | ORAL | 0 refills | Status: DC

## 2020-08-18 MED ORDER — FLUDROCORTISONE ACETATE 0.1 MG PO TABS
0.1000 mg | ORAL_TABLET | Freq: Every day | ORAL | Status: DC
  Administered 2020-08-19 – 2020-08-20 (×2): 0.1 mg via ORAL
  Filled 2020-08-18 (×3): qty 1

## 2020-08-18 MED ORDER — BISACODYL 10 MG RE SUPP: 10.0000 mg | Freq: Every day | RECTAL | Status: DC | PRN

## 2020-08-18 MED ORDER — METOCLOPRAMIDE HCL 5 MG PO TABS
10.0000 mg | ORAL_TABLET | Freq: Three times a day (TID) | ORAL | Status: DC
  Administered 2020-08-19 – 2020-08-30 (×35): 10 mg via ORAL
  Filled 2020-08-18 (×35): qty 2

## 2020-08-18 MED ORDER — FLEET ENEMA 7-19 GM/118ML RE ENEM: 1.0000 | ENEMA | Freq: Once | RECTAL | Status: DC | PRN

## 2020-08-18 MED ORDER — ACETAMINOPHEN 325 MG PO TABS
325.0000 mg | ORAL_TABLET | ORAL | Status: DC | PRN
  Administered 2020-08-19 – 2020-08-31 (×8): 650 mg via ORAL
  Filled 2020-08-18 (×9): qty 2

## 2020-08-18 MED ORDER — PANTOPRAZOLE SODIUM 20 MG PO TBEC
20.0000 mg | DELAYED_RELEASE_TABLET | Freq: Every day | ORAL | Status: DC
  Administered 2020-08-19 – 2020-08-31 (×13): 20 mg via ORAL
  Filled 2020-08-18 (×14): qty 1

## 2020-08-18 MED ORDER — ENOXAPARIN SODIUM 40 MG/0.4ML ~~LOC~~ SOLN
40.0000 mg | SUBCUTANEOUS | Status: DC
Start: 2020-08-19 — End: 2020-08-31
  Administered 2020-08-19 – 2020-08-31 (×13): 40 mg via SUBCUTANEOUS
  Filled 2020-08-18 (×13): qty 0.4

## 2020-08-18 MED ORDER — PRAMIPEXOLE DIHYDROCHLORIDE 0.25 MG PO TABS
0.5000 mg | ORAL_TABLET | Freq: Every day | ORAL | Status: DC
  Administered 2020-08-18 – 2020-08-30 (×13): 0.5 mg via ORAL
  Filled 2020-08-18 (×15): qty 2

## 2020-08-18 MED ORDER — PROCHLORPERAZINE 25 MG RE SUPP
12.5000 mg | Freq: Four times a day (QID) | RECTAL | Status: DC | PRN
Start: 2020-08-18 — End: 2020-08-31

## 2020-08-18 MED ORDER — ALUM & MAG HYDROXIDE-SIMETH 200-200-20 MG/5ML PO SUSP: 30.0000 mL | ORAL | Status: DC | PRN

## 2020-08-18 MED ORDER — POLYETHYLENE GLYCOL 3350 17 G PO PACK
17.0000 g | PACK | Freq: Every day | ORAL | Status: DC
  Administered 2020-08-19 – 2020-08-27 (×5): 17 g via ORAL
  Filled 2020-08-18 (×10): qty 1

## 2020-08-18 MED ORDER — ENSURE ENLIVE PO LIQD
237.0000 mL | Freq: Two times a day (BID) | ORAL | Status: DC
  Administered 2020-08-18: 237 mL via ORAL

## 2020-08-18 MED ORDER — HYDROMORPHONE HCL 2 MG PO TABS
4.0000 mg | ORAL_TABLET | ORAL | Status: DC | PRN
  Administered 2020-08-18 – 2020-08-30 (×15): 4 mg via ORAL
  Filled 2020-08-18 (×18): qty 2

## 2020-08-18 MED ORDER — GABAPENTIN 400 MG PO CAPS
400.0000 mg | ORAL_CAPSULE | Freq: Three times a day (TID) | ORAL | Status: DC
  Administered 2020-08-19 – 2020-08-20 (×6): 400 mg via ORAL
  Filled 2020-08-18 (×7): qty 1

## 2020-08-18 MED ORDER — PROCHLORPERAZINE EDISYLATE 10 MG/2ML IJ SOLN: 5.0000 mg | Freq: Four times a day (QID) | INTRAMUSCULAR | Status: DC | PRN

## 2020-08-18 MED ORDER — GUAIFENESIN-DM 100-10 MG/5ML PO SYRP: 5.0000 mL | ORAL_SOLUTION | Freq: Four times a day (QID) | ORAL | Status: DC | PRN

## 2020-08-18 MED ORDER — DEXAMETHASONE SODIUM PHOSPHATE 4 MG/ML IJ SOLN
4.0000 mg | Freq: Three times a day (TID) | INTRAMUSCULAR | Status: AC
  Administered 2020-08-18 – 2020-08-19 (×4): 4 mg via INTRAVENOUS
  Filled 2020-08-18 (×6): qty 1

## 2020-08-18 MED ORDER — PROCHLORPERAZINE MALEATE 5 MG PO TABS
5.0000 mg | ORAL_TABLET | Freq: Four times a day (QID) | ORAL | Status: DC | PRN
  Administered 2020-08-28: 10 mg via ORAL
  Filled 2020-08-18: qty 2

## 2020-08-18 MED ORDER — DEXAMETHASONE SODIUM PHOSPHATE 4 MG/ML IJ SOLN
4.0000 mg | Freq: Two times a day (BID) | INTRAMUSCULAR | Status: DC
  Filled 2020-08-18: qty 1

## 2020-08-18 MED ORDER — DIPHENHYDRAMINE HCL 12.5 MG/5ML PO ELIX: 12.5000 mg | ORAL_SOLUTION | Freq: Four times a day (QID) | ORAL | Status: DC | PRN

## 2020-08-18 MED ORDER — HYDROCODONE-ACETAMINOPHEN 5-325 MG PO TABS: 1.0000 | ORAL_TABLET | ORAL | 0 refills | Status: DC | PRN

## 2020-08-18 NOTE — Discharge Summary (Signed)
Physician Discharge Summary  Patient ID: Vanessa Medina MRN: [id number] DOB/AGE: 1971/04/04 49 y.o.  Admit date: 08/11/2020 Discharge date: 08/18/2020  Admission Diagnoses: right lower extremity parapesis   Discharge Diagnoses: same   Discharged Condition: good  Hospital Course: The patient was admitted on 08/11/2020 and taken to the operating room where the patient underwent SCS placement and woke up with right leg paraplegia at the surgery center. The patient was transferred to [facility] from the surgery center. MRI showed signal change in his cord where the scs leads were implanted. The hospital course was routine. There were no complications. The wound remained clean dry and intact. Pt had right leg pain and weakness. The patient remained afebrile with stable vital signs, and tolerated a regular diet. The patient continued to increase activities, and pain was well controlled with oral pain medications.   Consults: None  Significant Diagnostic Studies:  Results for orders placed or performed during the hospital encounter of 08/11/20  Resp Panel by RT-PCR (Flu A&B, Covid) Nasopharyngeal Swab   Specimen: Nasopharyngeal Swab; Nasopharyngeal(NP) swabs in vial transport medium  Result Value Ref Range   SARS Coronavirus 2 by RT PCR NEGATIVE NEGATIVE   Influenza A by PCR NEGATIVE NEGATIVE   Influenza B by PCR NEGATIVE NEGATIVE  MRSA PCR Screening   Specimen: Nasal Mucosa; Nasopharyngeal  Result Value Ref Range   MRSA by PCR NEGATIVE NEGATIVE  CBC with Differential  Result Value Ref Range   WBC 4.9 4.0 - 10.5 K/uL   RBC 3.78 (L) 3.87 - 5.11 MIL/uL   Hemoglobin 12.7 12.0 - 15.0 g/dL   HCT 37.6 36.0 - 46.0 %   MCV 99.5 80.0 - 100.0 fL   MCH 33.6 26.0 - 34.0 pg   MCHC 33.8 30.0 - 36.0 g/dL   RDW 12.1 11.5 - 15.5 %   Platelets 212 150 - 400 K/uL   nRBC 0.0 0.0 - 0.2 %   Neutrophils Relative % 90 %   Neutro Abs 4.4 1.7 - 7.7 K/uL   Lymphocytes Relative 8 %   Lymphs Abs 0.4 (L)  0.7 - 4.0 K/uL   Monocytes Relative 2 %   Monocytes Absolute 0.1 0.1 - 1.0 K/uL   Eosinophils Relative 0 %   Eosinophils Absolute 0.0 0.0 - 0.5 K/uL   Basophils Relative 0 %   Basophils Absolute 0.0 0.0 - 0.1 K/uL   Immature Granulocytes 0 %   Abs Immature Granulocytes 0.01 0.00 - 0.07 K/uL  Basic metabolic panel  Result Value Ref Range   Sodium 136 135 - 145 mmol/L   Potassium 3.7 3.5 - 5.1 mmol/L   Chloride 104 98 - 111 mmol/L   CO2 19 (L) 22 - 32 mmol/L   Glucose, Bld 110 (H) 70 - 99 mg/dL   BUN 43 (H) 6 - 20 mg/dL   Creatinine, Ser 3.20 (H) 0.44 - 1.00 mg/dL   Calcium 9.4 8.9 - 10.3 mg/dL   GFR, Estimated 17 (L) >60 mL/min   Anion gap 13 5 - 15  I-stat chem 8, ED (not at [facility] or [facility])  Result Value Ref Range   Sodium 138 135 - 145 mmol/L   Potassium 3.8 3.5 - 5.1 mmol/L   Chloride 110 98 - 111 mmol/L   BUN 44 (H) 6 - 20 mg/dL   Creatinine, Ser 3.40 (H) 0.44 - 1.00 mg/dL   Glucose, Bld 110 (H) 70 - 99 mg/dL   Calcium, Ion 1.06 (L) 1.15 - 1.40 mmol/L  TCO2 18 (L) 22 - 32 mmol/L   Hemoglobin 12.6 12.0 - 15.0 g/dL   HCT 37.0 36.0 - 46.0 %    MR THORACIC SPINE W WO CONTRAST  Result Date: 08/12/2020 CLINICAL DATA:  Initial evaluation for possible CSF leak, status post recent spinal stimulator placement, severe right lower extremity pain. EXAM: MRI THORACIC AND LUMBAR SPINE WITHOUT AND WITH CONTRAST TECHNIQUE: Multiplanar and multiecho pulse sequences of the thoracic and lumbar spine were obtained without and with intravenous contrast. CONTRAST:  77mL GADAVIST GADOBUTROL 1 MMOL/ML IV SOLN COMPARISON:  Previous MRIs from 06/16/2020 and 05/15/2021. FINDINGS: MRI THORACIC SPINE FINDINGS Alignment: Vertebral bodies normally aligned with preservation of the normal thoracic kyphosis. No listhesis or static subluxation. Vertebrae: Vertebral body height maintained without acute or chronic fracture. Bone marrow signal intensity within normal limits. No discrete or worrisome osseous lesions.  No abnormal marrow edema or enhancement. Cord: Normal signal and morphology. No epidural or subdural collections to suggest CSF leak. No abnormal enhancement. Paraspinal and other soft tissues: Paraspinous soft tissues within normal limits. Dependent atelectatic changes noted within the lungs. Small simple cyst noted within the interpolar left kidney. Disc levels: Mild for age multilevel disc degeneration without significant disc bulge or focal disc herniation. No new spinal stenosis or neural foraminal encroachment. No neural impingement. MRI LUMBAR SPINE FINDINGS Segmentation: Standard. Lowest well-formed disc space labeled the L5-S1 level. Alignment: Mild levoscoliosis. Trace chronic anterolisthesis of L4 on L5. Alignment otherwise normal preservation of the normal lumbar lordosis. Vertebrae: Susceptibility artifact related to prior PLIF at L2 through L5. Vertebral body height maintained without acute or chronic fracture. Bone marrow signal intensity within normal limits. No discrete or worrisome osseous lesions. No abnormal marrow edema or enhancement. Conus medullaris: Extends to the L1 level. There is a subtle focus of increased T2/STIR signal intensity within the right aspect of the distal cord at the level of T12-L1 (series 6, image 9 on sagittal STIR sequence, series 8, image 3 on axial T2 weighted sequence). Associated subtle postcontrast enhancement (series 40, image 10). This is new from previous. Given the history of recent spinal stimulator placement, finding suspicious for a small focus of edema/acute spinal cord injury. Remainder of the visualized conus is otherwise normal as are the nerve roots of the cauda equina. No subdural or epidural collections to suggest CSF leak. Paraspinal and other soft tissues: Soft tissue edema present within the posterior paraspinous soft tissues extending from T12 through L1, likely postoperative in nature. No discrete collections. Chronic postoperative changes noted  inferiorly. Subcentimeter simple cyst noted within the left kidney. Visualized visceral structures unremarkable. Disc levels: T12-L1: Unremarkable. L1-2: Mild disc bulge with disc desiccation. Small chronic endplate Schmorl's node deformity. Mild facet hypertrophy. No spinal stenosis. Foramina remain patent. L2-3: Prior PLIF with left facetectomy and foraminotomy. No recurrent canal or foraminal stenosis. L3-4: Prior PLIF with left facetectomy and foraminotomy. No residual spinal stenosis. Left neural foramina remains patent. Residual right foraminal protrusion closely approximates the exiting right L3 nerve root without frank impingement or significant foraminal stenosis (series 5, image 5), stable. L4-5: Prior PLIF with left facetectomy and foraminotomy. No residual spinal stenosis. Stable foraminal patency. L5-S1: Negative interspace. Minimal facet hypertrophy. No canal or foraminal stenosis. IMPRESSION: 1. Subtle focus of increased T2/STIR signal intensity with postcontrast enhancement within the right aspect of the distal cord at the level of T12-L1, new from previous exam. Given the history of recent spinal stimulator placement, finding suspicious for a small focus of edema/acute spinal  cord injury. 2. No other acute abnormality within the thoracic or lumbar spine. No evidence for CSF leak. 3. Prior PLIF at L2 through L5 without residual or recurrent spinal stenosis. Residual right foraminal protrusion at L3-4, closely approximating and potentially irritating the exiting right L3 nerve root, stable. Electronically Signed   By: Jeannine Boga M.D.   On: 08/12/2020 00:05   MR Lumbar Spine W Wo Contrast  Result Date: 08/12/2020 CLINICAL DATA:  Initial evaluation for possible CSF leak, status post recent spinal stimulator placement, severe right lower extremity pain. EXAM: MRI THORACIC AND LUMBAR SPINE WITHOUT AND WITH CONTRAST TECHNIQUE: Multiplanar and multiecho pulse sequences of the thoracic and  lumbar spine were obtained without and with intravenous contrast. CONTRAST:  54mL GADAVIST GADOBUTROL 1 MMOL/ML IV SOLN COMPARISON:  Previous MRIs from 06/16/2020 and 05/15/2021. FINDINGS: MRI THORACIC SPINE FINDINGS Alignment: Vertebral bodies normally aligned with preservation of the normal thoracic kyphosis. No listhesis or static subluxation. Vertebrae: Vertebral body height maintained without acute or chronic fracture. Bone marrow signal intensity within normal limits. No discrete or worrisome osseous lesions. No abnormal marrow edema or enhancement. Cord: Normal signal and morphology. No epidural or subdural collections to suggest CSF leak. No abnormal enhancement. Paraspinal and other soft tissues: Paraspinous soft tissues within normal limits. Dependent atelectatic changes noted within the lungs. Small simple cyst noted within the interpolar left kidney. Disc levels: Mild for age multilevel disc degeneration without significant disc bulge or focal disc herniation. No new spinal stenosis or neural foraminal encroachment. No neural impingement. MRI LUMBAR SPINE FINDINGS Segmentation: Standard. Lowest well-formed disc space labeled the L5-S1 level. Alignment: Mild levoscoliosis. Trace chronic anterolisthesis of L4 on L5. Alignment otherwise normal preservation of the normal lumbar lordosis. Vertebrae: Susceptibility artifact related to prior PLIF at L2 through L5. Vertebral body height maintained without acute or chronic fracture. Bone marrow signal intensity within normal limits. No discrete or worrisome osseous lesions. No abnormal marrow edema or enhancement. Conus medullaris: Extends to the L1 level. There is a subtle focus of increased T2/STIR signal intensity within the right aspect of the distal cord at the level of T12-L1 (series 6, image 9 on sagittal STIR sequence, series 8, image 3 on axial T2 weighted sequence). Associated subtle postcontrast enhancement (series 40, image 10). This is new from  previous. Given the history of recent spinal stimulator placement, finding suspicious for a small focus of edema/acute spinal cord injury. Remainder of the visualized conus is otherwise normal as are the nerve roots of the cauda equina. No subdural or epidural collections to suggest CSF leak. Paraspinal and other soft tissues: Soft tissue edema present within the posterior paraspinous soft tissues extending from T12 through L1, likely postoperative in nature. No discrete collections. Chronic postoperative changes noted inferiorly. Subcentimeter simple cyst noted within the left kidney. Visualized visceral structures unremarkable. Disc levels: T12-L1: Unremarkable. L1-2: Mild disc bulge with disc desiccation. Small chronic endplate Schmorl's node deformity. Mild facet hypertrophy. No spinal stenosis. Foramina remain patent. L2-3: Prior PLIF with left facetectomy and foraminotomy. No recurrent canal or foraminal stenosis. L3-4: Prior PLIF with left facetectomy and foraminotomy. No residual spinal stenosis. Left neural foramina remains patent. Residual right foraminal protrusion closely approximates the exiting right L3 nerve root without frank impingement or significant foraminal stenosis (series 5, image 5), stable. L4-5: Prior PLIF with left facetectomy and foraminotomy. No residual spinal stenosis. Stable foraminal patency. L5-S1: Negative interspace. Minimal facet hypertrophy. No canal or foraminal stenosis. IMPRESSION: 1. Subtle focus of increased T2/STIR  signal intensity with postcontrast enhancement within the right aspect of the distal cord at the level of T12-L1, new from previous exam. Given the history of recent spinal stimulator placement, finding suspicious for a small focus of edema/acute spinal cord injury. 2. No other acute abnormality within the thoracic or lumbar spine. No evidence for CSF leak. 3. Prior PLIF at L2 through L5 without residual or recurrent spinal stenosis. Residual right foraminal  protrusion at L3-4, closely approximating and potentially irritating the exiting right L3 nerve root, stable. Electronically Signed   By: Jeannine Boga M.D.   On: 08/12/2020 00:05   US RENAL  Result Date: 08/07/2020 CLINICAL DATA:  Acute renal injury EXAM: RENAL / URINARY TRACT ULTRASOUND COMPLETE COMPARISON:  November 06, 2007 FINDINGS: Right Kidney: Renal measurements: 9.4 x 4.1 x 4.6 cm = volume: 98 mL. Increased cortical echogenicity. Left Kidney: Renal measurements: 11.1 x 5.3 x 5.2 cm = volume: 159 mL. Increased cortical echogenicity. Bladder: Not visualize due to lack of distention. Other: None. IMPRESSION: 1. Increased cortical echogenicity bilaterally consistent with medical renal disease. 2. The bladder is not visualized due to lack of distention. Electronically Signed   By: Dorise Bullion III M.D   On: 08/07/2020 13:33    Antibiotics:  Anti-infectives (From admission, onward)   None      Discharge Exam: Blood pressure 127/72, pulse (!) 47, temperature 97.6 F (36.4 C), resp. rate 18, height 5\' 4"  (1.626 m), weight 109.3 kg, SpO2 100 %. Neurologic: Grossly normal, right lower extremity 2/5   Discharge Medications:   Allergies as of 08/18/2020      Reactions   Shellfish Allergy Anaphylaxis   Meperidine Hcl Other (See Comments)   BRADYCARDIA   Morphine Other (See Comments)   BRADYCARDIA   Bee Pollen Other (See Comments)   Unknown   Ivp Dye [iodinated Diagnostic Agents] Other (See Comments)   Swelling    Oxycodone Hives, Itching   Blisters on back   Propoxyphene Nausea Only, Nausea And Vomiting   Codeine Nausea And Vomiting   Oxycodone-acetaminophen Itching   Pentazocine Lactate Nausea And Vomiting   REACTION: vomiting with Talwin NX   Propoxyphene N-acetaminophen Nausea And Vomiting      Medication List    TAKE these medications   celecoxib 200 MG capsule Commonly known as: CELEBREX TAKE 1 CAPSULE BY MOUTH  TWICE DAILY   fludrocortisone 0.1 MG  tablet Commonly known as: FLORINEF Take 1 tablet (0.1 mg total) by mouth daily.   gabapentin 400 MG capsule Commonly known as: NEURONTIN Take 1 capsule (400 mg total) by mouth 3 (three) times daily.   HYDROcodone-acetaminophen 5-325 MG tablet Commonly known as: NORCO/VICODIN Take 1 tablet by mouth every 4 (four) hours as needed for moderate pain. What changed: when to take this   methocarbamol 750 MG tablet Commonly known as: ROBAXIN Take 1 tablet (750 mg total) by mouth every 6 (six) hours as needed for muscle spasms.   midodrine 10 MG tablet Commonly known as: PROAMATINE Take 1 tablet (10 mg total) by mouth 3 (three) times daily.   pramipexole 0.125 MG tablet Commonly known as: MIRAPEX Take 0.5 mg by mouth at bedtime.   topiramate 25 MG tablet Commonly known as: TOPAMAX Take 50 mg by mouth at bedtime.   Xiidra 5 % Soln Generic drug: Lifitegrast Place 1 drop into both eyes in the morning and at bedtime.       Disposition: CIR  Final Dx: Right lower extremity parapesis  Discharge Instructions  Call MD for:  difficulty breathing, headache or visual disturbances   Complete by: As directed    Call MD for:  hives   Complete by: As directed    Call MD for:  persistant dizziness or light-headedness   Complete by: As directed    Call MD for:  persistant nausea and vomiting   Complete by: As directed    Call MD for:  redness, tenderness, or signs of infection (pain, swelling, redness, odor or green/yellow discharge around incision site)   Complete by: As directed    Call MD for:  severe uncontrolled pain   Complete by: As directed    Call MD for:  temperature >100.4   Complete by: As directed    Diet - low sodium heart healthy   Complete by: As directed    Driving Restrictions   Complete by: As directed    No driving for 2 weeks, no riding in the car for 1 week   Increase activity slowly   Complete by: As directed    No wound care   Complete by: As directed           Signed: Ocie Cornfield Matilynn Dacey 08/18/2020, 11:25 AM

## 2020-08-18 NOTE — TOC Transition Note (Signed)
Transition of Care (TOC) - CM/SW Discharge Note Marvetta Gibbons RN,BSN Transitions of Care Unit 4NP (non trauma) - RN Case Manager See Treatment Team for direct Phone #   Patient Details  Name: [name] MRN: [id number] Date of Birth: December 19, 1970  [facility]) CM/SW Contact:  Dawayne Patricia, RN Phone Number: 08/18/2020, 4:18 PM   Clinical Narrative:    Pt from home, s/p SCS placement, CIR consulted and has insurance auth for admission to [facility]. Per INPT AC Bed available today and pt will transition to INPT rehab later today.    Final next level of care: IP Rehab Facility Barriers to Discharge: No Barriers Identified   Patient Goals and CMS Choice Patient states their goals for this hospitalization and ongoing recovery are:: rehab CMS Medicare.gov Compare Post Acute Care list provided to:: Patient Choice offered to / list presented to : Patient  Discharge Placement               INPT rehab        Discharge Plan and Services     Post Acute Care Choice: IP Rehab                               Social Determinants of Health (SDOH) Interventions     Readmission Risk Interventions Readmission Risk Prevention Plan 08/18/2020  Post Dischage Appt (No Data)  Medication Screening Complete  Transportation Screening Complete  Some recent data might be hidden

## 2020-08-18 NOTE — H&P (Addendum)
Physical Medicine and Rehabilitation Admission H&P    Chief Complaint  Patient presents with  . Functional deficits due to spinal cord injury    HPI: Vanessa Medina is a 49 year old female with history of HTN, migraines, morbid obesity--BMI-41, chronic hypotension, multiple back surgeries with chronic pain with LLE lumbar radiculopathy who was scheduled to have spinal cord stimulator placed by Dr. Davy Pique on 08/11/2020 but unable to perform surgery due to scar tissue.  History taken from patient and chart review.  Post op on awakening, she had excruciating pain RLE with plegia and and allodynia. She was admitted for work up by Dr. Ronnald Ramp from surgical center on 08/11/2020. Thoracic MRI done revealing subtle increased T2/STIR intensity distal cord at T12-L1 level suspicious for edema/acute spinal cord injury, no CSF leak as well as prior PLIF L2-L5 with residual foraminal protrusion L3/5 potentially irritating right L3 nerve. She was started on IV Dilaudid, gabapentin as well as IV Decadron for management of pain, headaches and severe neuropathy.  She continues to have pain in RLE but is having some motor return, has constant headache, decreased hearing in right> left ear, constipation with nausea as well as epigastric pain and weakness. Therapy ongoing and CIR recommended due to functional decline.  Of note, she reports 50 lbs weigh loss in the past 3-4 months--due to issues with N/V/intake. Has had two falls last month---no recall of incident leading to fall question due to syncope. Was in process of work up for renal disease. She has had issue with LLE weakness with pain for the past year and being followed by Dr. Ernestina Patches. Did well with stimulator trial.  Please see preadmission assessment from earlier today as well.   Review of Systems  Constitutional: Negative for chills and fever.  HENT: Negative for hearing loss and tinnitus.   Eyes: Negative for blurred vision and double vision.   Respiratory: Negative for cough and shortness of breath.   Cardiovascular: Negative for chest pain and palpitations.  Gastrointestinal: Positive for heartburn and nausea.  Genitourinary:       Reports hesitancy and  that she urinates once a day or every other day.   Musculoskeletal: Positive for back pain, falls (two in the past month) and myalgias.  Skin: Negative for itching and rash.  Neurological: Positive for dizziness, tingling, sensory change, focal weakness, weakness and headaches.  Psychiatric/Behavioral: The patient is nervous/anxious.   All other systems reviewed and are negative.    Past Medical History:  Diagnosis Date  . Anemia   . Cervical cancer ([place])   . Family history of adverse reaction to anesthesia     " my mother takes a long time time wake up"  . GERD (gastroesophageal reflux disease)   . Headache    migraine  . History of shingles   . Hypertension   . Migraine   . Multiple allergies   . Obesity   . Osteoarthritis    left hip  . PONV (postoperative nausea and vomiting)   . Sleep apnea    does not wear CPAP  . Wears glasses     Past Surgical History:  Procedure Laterality Date  . ABDOMINAL HYSTERECTOMY    . APPENDECTOMY    . BILIOPANCREATIC DIVERSION     with duodenal switch laparoscopic   . CHOLECYSTECTOMY    . DIAGNOSTIC LAPAROSCOPY     LOA  . ESOPHAGOGASTRODUODENOSCOPY (EGD) WITH PROPOFOL N/A 07/25/2017   Procedure: ESOPHAGOGASTRODUODENOSCOPY (EGD) WITH PROPOFOL;  Surgeon: Vira Agar,  Gavin Pound, MD;  Location: [facility];  Service: Endoscopy;  Laterality: N/A;  . KNEE ARTHROSCOPY    . LAPAROSCOPIC GASTRIC SLEEVE RESECTION WITH HIATAL HERNIA REPAIR    . TONSILLECTOMY    . TOTAL HIP ARTHROPLASTY Left 01/26/2016   Procedure: LEFT TOTAL HIP ARTHROPLASTY ANTERIOR APPROACH;  Surgeon: Leandrew Koyanagi, MD;  Location: [facility];  Service: Orthopedics;  Laterality: Left;  . TRANSFORAMINAL LUMBAR INTERBODY FUSION (TLIF) WITH PEDICLE SCREW FIXATION 3 LEVEL   08/2019   Basil Dess, MD L2-L5    Family History  Problem Relation Age of Onset  . Renal Disease Mother   . Hypertension Mother   . Sudden Cardiac Death Mother   . Heart failure Mother   . Valvular heart disease Mother   . Heart disease Mother   . Stroke Brother   . Heart attack Brother 66  . Diabetes Other   . 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 Has not used walker or rollater since 12/2019. She  reports that she has never smoked. She has never used smokeless tobacco. She reports current alcohol use. She reports that she does not use drugs.   Allergies  Allergen Reactions  . Shellfish Allergy Anaphylaxis  . Meperidine Hcl Other (See Comments)    BRADYCARDIA  . Morphine Other (See Comments)    BRADYCARDIA  . Bee Pollen Other (See Comments)    Unknown  . Ivp Dye [Iodinated Diagnostic Agents] Other (See Comments)    Swelling   . Oxycodone Hives and Itching    Blisters on back  . Propoxyphene Nausea Only and Nausea And Vomiting  . Codeine Nausea And Vomiting  . Oxycodone-Acetaminophen Itching  . Pentazocine Lactate Nausea And Vomiting    REACTION: vomiting with Talwin NX  . Propoxyphene N-Acetaminophen Nausea And Vomiting    Facility-Administered Medications Prior to Admission  Medication Dose Route Frequency Provider Last Rate Last Admin  . diphenhydrAMINE (BENADRYL) capsule 25 mg  25 mg Oral Q6H PRN Reece Agar, MD        Medications Prior to Admission  Medication Sig Dispense Refill  . fludrocortisone (FLORINEF) 0.1 MG tablet Take 1 tablet (0.1 mg total) by mouth daily. 30 tablet 5  . Lifitegrast (XIIDRA) 5 % SOLN Place 1 drop into both eyes in the morning and at bedtime.     . midodrine (PROAMATINE) 10 MG tablet Take 1 tablet (10 mg total) by mouth 3 (three) times daily. 270 tablet 3  . pramipexole (MIRAPEX) 0.125 MG tablet Take 0.5 mg  by mouth at bedtime.    . topiramate (TOPAMAX) 25 MG tablet Take 50 mg by mouth at bedtime.    . celecoxib (CELEBREX) 200 MG capsule TAKE 1 CAPSULE BY MOUTH  TWICE DAILY (Patient not taking: No sig reported) 180 capsule 3  . [DISCONTINUED] HYDROcodone-acetaminophen (NORCO/VICODIN) 5-325 MG tablet Take 1 tablet by mouth every 6 (six) hours as needed for moderate pain. (Patient not taking: Reported on 08/12/2020) 30 tablet 0    Drug Regimen Review  Drug regimen was reviewed and remains appropriate with no significant issues identified  Home: Home Living Family/patient expects to be discharged to:: Private residence Living Arrangements: Parent Available Help at Discharge: Family,Available 24 hours/day Type of Home: House Home Access: Stairs to enter [employer] of Steps: 2 (back deck has been redone to  be more steps with shorter rise, maybe 15 stairs) Entrance Stairs-Rails: Right,Left Home Layout: One level Bathroom Shower/Tub: Chiropodist: Standard Bathroom Accessibility: Yes Home Equipment: Walker - 4 wheels,Cane - quad,Grab bars - toilet,Shower seat,Grab bars - tub/shower,Hand held shower head,Bedside commode Additional Comments: 27 pound cat name Butterbean  (real name [name]) / power chair at home   Functional History: Prior Function Level of Independence: Independent Comments: after removal of trial SCS, pt reports 2 major falls in last month, bruising to R knee after fall. LOC unsure about hitting head. independent with increased time when home alone for ADLs and IADLs. works from home on computer  Functional Status:  Mobility: Bed Mobility Overal bed mobility: Needs Assistance Bed Mobility: Sit to Supine Supine to sit: Min assist Sit to supine: Mod assist,HOB elevated General bed mobility comments: OOB on BSC upon arrival Transfers Overall transfer level: Needs assistance Equipment used: Rolling walker (2 wheeled) Transfers: Sit to/from  Stand Sit to Stand: Min assist General transfer comment: Min assist for power up, rise and steady. standing from [facility]; increased time/effort to perform Ambulation/Gait Ambulation/Gait assistance: Min assist Gait Distance (Feet): 10 Feet (+3) Assistive device: Rolling walker (2 wheeled) Gait Pattern/deviations: Step-to pattern,Decreased step length - right,Decreased step length - left,Antalgic,Trunk flexed General Gait Details: Min assist to steady, guide RW. Pt requesting use of pillow under RLE to offweight and serve as a "ski" as pt is having difficulty performing hip and knee flexion during swing phase of gait. Pt ambulatory to bathroom, then walked 3 ft to sink before getting near syncopal requiring PT emergently calling RN staff into room to support pt with chair. Pt did not become completely syncopal. Gait velocity: decr Gait velocity interpretation: <1.31 ft/sec, indicative of household ambulator    ADL: ADL Overall ADL's : Needs assistance/impaired Eating/Feeding: Independent Eating/Feeding Details (indicate cue type and reason): reports nausea with all po intake. reports only keeping cracks and graham crackers down Grooming: Wash/dry hands,Set up,Sitting Grooming Details (indicate cue type and reason): pt able to complete ~15 minutes of standing activity at sink with weight shift to L foot and use of RW. pt able to release with bil UE and when reaching up for hair with immediate R LOB and Max (A) to catch. Lower Body Dressing: Total assistance Toilet Transfer: Minimal assistance,Ambulation,BSC,RW Toilet Transfer Details (indicate cue type and reason): pt on BSC upon arrival to room, minA for mobility around EOB to recliner, increased time/effort for mobility tasks Toileting- Clothing Manipulation and Hygiene: Sit to/from stand,Minimal assistance Toileting - Clothing Manipulation Details (indicate cue type and reason): pt performing anterior and posterior pericare in standing; minA for  standing balance, increased time to perform Functional mobility during ADLs: Minimal assistance,Rolling walker General ADL Comments: pt sliding pillow on the floor under R foot  Cognition: Cognition Overall Cognitive Status: Within Functional Limits for tasks assessed Orientation Level: Oriented X4 Cognition Arousal/Alertness: Awake/alert Behavior During Therapy: WFL for tasks assessed/performed Overall Cognitive Status: Within Functional Limits for tasks assessed   Blood pressure 127/72, pulse (!) 47, temperature 97.6 F (36.4 C), resp. rate 18, height 5\' 4"  (1.626 m), weight 109.3 kg, SpO2 100 %. Physical Exam Vitals and nursing note reviewed.  Constitutional:      General: She is not in acute distress.    Appearance: Normal appearance. She is obese.     Comments: Anxious appearing female in darkened room due to light sensitivity/HA.   HENT:     Head: Normocephalic and atraumatic.  Right Ear: External ear normal.     Left Ear: External ear normal.     Nose: Nose normal.     Mouth/Throat:     Pharynx: No oropharyngeal exudate or posterior oropharyngeal erythema.  Eyes:     General:        Right eye: No discharge.        Left eye: No discharge.     Extraocular Movements: Extraocular movements intact.  Cardiovascular:     Rate and Rhythm: Normal rate and regular rhythm.  Pulmonary:     Effort: Pulmonary effort is normal. No respiratory distress.     Breath sounds: No stridor.  Abdominal:     General: Bowel sounds are normal.  Musculoskeletal:     Cervical back: Normal range of motion and neck supple.     Comments: Lower extremity edema  Skin:    General: Skin is warm and dry.     Comments: Incision with dressing CDI  Neurological:     Comments: Mild left facial weakness with ptosis and halting speech at times.--baseline per son.  Motor: RUE: 4 -/5 proximal distally in LUE:/5 proximal RLE: Blood, extension 3 5/5, ankle dorsiflexion 2-5/5 LLE: Hip flexion, knee  extension, dorsiflexion 4 -/5  Psychiatric:        Mood and Affect: Mood normal.        Behavior: Behavior normal.     No results found for this or any previous visit (from the past 48 hour(s)). No results found.     Medical Problem List and Plan: 1.  Deficits with mobility, transfers, endurance, self-care secondary to paraparesis.   -patient may shower with incision dressed  -ELOS/Goals: 10-14 days/supervision/mod I  Admit to CIR 2.  Antithrombotics: -DVT/anticoagulation:  Pharmaceutical: Lovenox  -antiplatelet therapy: N/A 3. Pain Management: On IV decadron, robaxin and tylenol prn.   --continue gabapentin 400 mg tid for neuropathy 4. Mood: LCSW to follow for evaluation and support.   -antipsychotic agents: N/A 5. Neuropsych: This patient is capable of making decisions on her own behalf. 6. Skin/Wound Care: Routine pressure relief measures.  7. Fluids/Electrolytes/Nutrition: Monitor I/Os. Discontinue IVF and encourage fluid intake.   CMP ordered for tomorrow 8. Migraines: Takes Topamax at bedtime. Now constant due to spinal cord injury. Supportive care recommended. 9. Constipation: Likely obstipation as has not had BM since admission.  KUB ordered to determine stool burden.    Continue Reglan order enema and augment bowel regimen.  10. Bradycardia: Question due to spinal injury--HR has in 40's but been as low as 30's.   ECG ordered 11. Chronic hypotension:  On midodrine tid with florinef per Dr. Rockey Situ.    Orthostatic vital signs ordered  Monitor with increased exertion 12. Morbid obesity s/p gastric sleeve: Encourage/educate on weight loss and dietary changes to help promote overall health and mobility.  13. Acute on chronic renal failure: Ultrasound 08/2020 showing medical renal disease. BUN/SCr-26/1.7 on 11/30. Had normal renal function a year ago. No labs since 12/08--BUN/SCr-44/3.40. Has been on IVF for hydration.   CMP ordered for tomorrow a.m.    Will also order PVRs  to rule out neurogenic bladder as cause.    Bary Leriche, PA-C 08/18/2020  I have personally performed a face to face diagnostic evaluation, including, but not limited to relevant history and physical exam findings, of this patient and developed relevant assessment and plan.  Additionally, I have reviewed and concur with the physician assistant's documentation above.  Delice Lesch, MD, ABPMR

## 2020-08-18 NOTE — Progress Notes (Signed)
Occupational Therapy Treatment Patient Details Name: Vanessa Medina MRN: [id number] DOB: 1971/08/03 Today's Date: 08/18/2020    History of present illness The pt is a 49 yo female presenting after acute onset weakness and pain in her RLE in pacu at surgical center for attempted spinal cord stimulator placement. PMH includes: multiple spine surgeries and multi-level fusion, with most recent operation one year ago.  Anemia, GERD, HTN, obesity, and chronic back pain.   OT comments  Pt progressing towards OT goals. She remains with limitations mostly due to RLE pain at this time. Pt tolerating short distance mobility in room using RW, overall with minA though requiring increased time/effort and continued use of pillow as added support/"ski" for transitioning RLE. Pt able to perform toileting ADL with minA, anticipate will require increased assist for LB ADL given LE deficits. Feel pt remains appropriate candidate for CIR level therapies at this time. Will continue to follow acutely.   Follow Up Recommendations  CIR    Equipment Recommendations  3 in 1 bedside commode;Other (comment) (RW, TBD in next vene)          Precautions / Restrictions Precautions Precautions: Fall Restrictions Weight Bearing Restrictions: No Other Position/Activity Restrictions: RLE limited by significant pain       Mobility Bed Mobility               General bed mobility comments: OOB on BSC upon arrival  Transfers Overall transfer level: Needs assistance Equipment used: Rolling walker (2 wheeled) Transfers: Sit to/from Stand Sit to Stand: Min assist         General transfer comment: Min assist for power up, rise and steady. standing from [facility]; increased time/effort to perform    Balance Overall balance assessment: Needs assistance Sitting-balance support: Bilateral upper extremity supported;Feet supported Sitting balance-Leahy Scale: Fair     Standing balance support: Bilateral upper  extremity supported;During functional activity Standing balance-Leahy Scale: Poor Standing balance comment: reliant on at least single UE support and external assist                           ADL either performed or assessed with clinical judgement   ADL Overall ADL's : Needs assistance/impaired     Grooming: Wash/dry hands;Set up;Sitting                   Toilet Transfer: Minimal assistance;Ambulation;BSC;RW Toilet Transfer Details (indicate cue type and reason): pt on BSC upon arrival to room, minA for mobility around EOB to recliner, increased time/effort for mobility tasks Toileting- Clothing Manipulation and Hygiene: Sit to/from stand;Minimal assistance Toileting - Clothing Manipulation Details (indicate cue type and reason): pt performing anterior and posterior pericare in standing; minA for standing balance, increased time to perform     Functional mobility during ADLs: Minimal assistance;Rolling walker                         Cognition Arousal/Alertness: Awake/alert Behavior During Therapy: WFL for tasks assessed/performed Overall Cognitive Status: Within Functional Limits for tasks assessed                                          Exercises     Shoulder Instructions       General Comments pt denies dizziness/symptoms of pre-syncopy with mobility this session    Pertinent  Vitals/ Pain       Pain Assessment: Faces Faces Pain Scale: Hurts even more Pain Location: RLE, especially foot Pain Descriptors / Indicators: Discomfort;Sore;Shooting;Other (Comment) ("electric shock") Pain Intervention(s): Monitored during session;Repositioned  Home Living                                          Prior Functioning/Environment              Frequency  Min 2X/week        Progress Toward Goals  OT Goals(current goals can now be found in the care plan section)  Progress towards OT goals: Progressing  toward goals  Acute Rehab OT Goals Patient Stated Goal: to be home by Christmas OT Goal Formulation: With patient Time For Goal Achievement: 08/30/20 Potential to Achieve Goals: Good ADL Goals Pt Will Perform Grooming: with min guard assist;standing Pt Will Perform Upper Body Bathing: with min guard assist;sitting Pt Will Transfer to Toilet: with min guard assist;ambulating;bedside commode Additional ADL Goal #1: Pt will complete bed mobility supervision as precursor to adls  Plan Discharge plan remains appropriate    Co-evaluation                 AM-PAC OT "6 Clicks" Daily Activity     Outcome Measure   Help from another person eating meals?: None Help from another person taking care of personal grooming?: A Little Help from another person toileting, which includes using toliet, bedpan, or urinal?: A Little Help from another person bathing (including washing, rinsing, drying)?: A Lot Help from another person to put on and taking off regular upper body clothing?: A Little Help from another person to put on and taking off regular lower body clothing?: A Lot 6 Click Score: 17    End of Session Equipment Utilized During Treatment: Gait belt;Rolling walker  OT Visit Diagnosis: Unsteadiness on feet (R26.81);Muscle weakness (generalized) (M62.81);Pain Pain - Right/Left: Right Pain - part of body: Leg   Activity Tolerance Patient tolerated treatment well   Patient Left in chair;with call bell/phone within reach;with nursing/sitter in room (NT present)   Nurse Communication Mobility status        Time: 8638-1771 OT Time Calculation (min): 28 min  Charges: OT General Charges $OT Visit: 1 Visit OT Treatments $Self Care/Home Management : 23-37 mins  Lou Cal, OT Acute Rehabilitation Services Pager [phone] Office [phone]    Raymondo Band 08/18/2020, 9:46 AM

## 2020-08-18 NOTE — Progress Notes (Signed)
Vanessa Arn, MD  Physician  Physical Medicine and Rehabilitation  PMR Pre-admission     Signed  Date of Service:  08/18/2020 11:52 AM      Related encounter: ED to Hosp-Admission (Current) from 08/11/2020 in [place]         Vanessa Arn, MD  Physician  Physical Medicine and Rehabilitation  PMR Pre-admission      Addendum   Date of Service:  08/14/2020  3:02 PM                       PMR Admission Coordinator Pre-Admission Assessment   Patient: Vanessa Medina is an 49 y.o., female MRN: [id number] DOB: June 13, 1971 Height: _0  (162.6 cm) Weight: 109.3 kg                                                                                                                                                  Insurance Information HMO:     PPO:      PCP:      IPA:      80/20:      OTHER:  PRIMARY: [place]     Policy#: [id number]      Subscriber: [place] Name: Vanessa Medina      Phone#: [phone]    Fax#: [phone] Pre-Cert#:  [id number].      Employer: [place] provided by [employer] for admit to [employer] Auth provided on 08/17/20. Clinical updates are due to [facility] in 7 days. (p): [phone] (f): [phone] Benefits:  Phone #: online      Name: [email or web address]  Eff. Date: 09/05/2019 - 09/03/2020  Deductible: $750 ($750 met) OOP Max: $2,500 ($2,500 met - includes deductible) CIR: 10%co-insurance After you pay the deductible. SNF:  10%co-insurance After you pay the deductible. Outpatient: 10%coinsurance After you pay the deductible. Home Health: 10%co-insurance After you pay the deductible. DME: 10%coinsurance After you pay the deductible.  Providers: In network  SECONDARY:     None    The "Data Collection Information Summary" for patients in Inpatient Rehabilitation Facilities with attached "Privacy Act [place] Records" was provided and verbally reviewed with: N/A   Emergency Contact  Information                Contact Information     Name Relation Home Work Mobile     Medina,Vanessa Son [phone]            Current Medical History  Patient Admitting Diagnosis: Spinal Cord Injury History of Present Illness: The pt is a 49 yo female presenting after acute onset weakness and pain in her RLE in pacu at surgical center for attempted spinal cord stimulator placement. PMH includes: multiple spine surgeries and multi-level fusion, with most recent  operation one year ago.  Anemia, GERD, HTN, obesity, and chronic back pain. Glasgow Coma Scale Score: 15   Past Medical History         Past Medical History:  Diagnosis Date  . Anemia    . Cervical cancer ([place])    . Family history of adverse reaction to anesthesia       " my mother takes a long time time wake up"  . GERD (gastroesophageal reflux disease)    . Headache      migraine  . History of shingles    . Hypertension    . Migraine    . Multiple allergies    . Obesity    . Osteoarthritis      left hip  . PONV (postoperative nausea and vomiting)    . Sleep apnea      does not wear CPAP  . Wears glasses        Family History  family history includes Breast cancer in her cousin; Diabetes in an other family member; Heart attack (age of onset: 32) in her brother; Heart disease in her mother; Heart failure in her mother; Hypertension in her mother; Renal Disease in her mother; Stroke in her brother; Sudden Cardiac Death in her mother; Valvular heart disease in her mother.   Prior Rehab/Hospitalizations:  Has the patient had prior rehab or hospitalizations prior to admission? Yes   Has the patient had major surgery during 100 days prior to admission? Yes   Current Medications    Current Facility-Administered Medications:  .  0.9 %  sodium chloride infusion, , Intravenous, Continuous, Meyran, Ocie Cornfield, NP, Last Rate: 50 mL/hr at 08/17/20 0600, Infusion Verify at 08/17/20 0600 .  acetaminophen (TYLENOL)  tablet 650 mg, 650 mg, Oral, Q6H PRN, 650 mg at 08/17/20 1417 **OR** acetaminophen (TYLENOL) suppository 650 mg, 650 mg, Rectal, Q6H PRN, Meyran, Ocie Cornfield, NP .  bisacodyl (DULCOLAX) EC tablet 5 mg, 5 mg, Oral, Daily PRN, Meyran, Ocie Cornfield, NP, 5 mg at 08/18/20 0902 .  dexamethasone (DECADRON) injection 2 mg, 2 mg, Intravenous, Q6H, Eustace Moore, MD, 2 mg at 08/18/20 0547 .  diphenhydrAMINE (BENADRYL) capsule 25 mg, 25 mg, Oral, Q6H PRN, Reece Agar, MD .  enoxaparin (LOVENOX) injection 30 mg, 30 mg, Subcutaneous, Daily, Dang, Thuy D, RPH, 30 mg at 08/18/20 0904 .  fludrocortisone (FLORINEF) tablet 0.1 mg, 0.1 mg, Oral, Daily, Meyran, Ocie Cornfield, NP, 0.1 mg at 08/18/20 0902 .  gabapentin (NEURONTIN) capsule 400 mg, 400 mg, Oral, TID, Reece Agar, MD, 400 mg at 08/18/20 0902 .  HYDROmorphone (DILAUDID) injection 1-1.5 mg, 1-1.5 mg, Intravenous, Q2H PRN, Reece Agar, MD, 1 mg at 08/15/20 1225 .  HYDROmorphone (DILAUDID) tablet 4 mg, 4 mg, Oral, Q3H PRN, Reece Agar, MD, 4 mg at 08/16/20 2155 .  methocarbamol (ROBAXIN) tablet 750 mg, 750 mg, Oral, Q6H PRN, Reece Agar, MD .  metoCLOPramide (REGLAN) tablet 10 mg, 10 mg, Oral, TID AC, Reece Agar, MD, 10 mg at 08/18/20 0902 .  midodrine (PROAMATINE) tablet 10 mg, 10 mg, Oral, TID WC, Meyran, Ocie Cornfield, NP, 10 mg at 08/18/20 0902 .  naloxone [facility]) injection 0.4 mg, 0.4 mg, Intravenous, PRN, Reece Agar, MD .  ondansetron [facility]) injection 4 mg, 4 mg, Intravenous, Q8H, Meyran, Ocie Cornfield, NP, 4 mg at 08/18/20 0547 .  pantoprazole (PROTONIX) EC tablet 20 mg, 20 mg, Oral, Daily, Reece Agar, MD, 20 mg at 08/18/20 0902 .  polyethylene glycol (MIRALAX / GLYCOLAX) packet 17 g, 17 g, Oral, Daily PRN, Meyran, Ocie Cornfield, NP, 17 g at 08/17/20 0836 .  pramipexole (MIRAPEX) tablet 0.5 mg, 0.5 mg, Oral, QHS, Meyran, Ocie Cornfield, NP, 0.5 mg at 08/17/20 2135 .  topiramate (TOPAMAX) tablet 25  mg, 25 mg, Oral, BID, Meyran, Ocie Cornfield, NP, 25 mg at 08/18/20 0902   Patients Current Diet:            Diet Order                            Diet regular Room service appropriate? Yes; Fluid consistency: Thin  Diet effective now                           Precautions / Restrictions Precautions Precautions: Fall Precaution Comments: watch BP Restrictions Weight Bearing Restrictions: No Other Position/Activity Restrictions: RLE limited by significant pain    Has the patient had 2 or more falls or a fall with injury in the past year?Yes   Prior Activity Level Community (5-7x/wk): Pt. was active in community PTA   Prior Functional Level Prior Function Level of Independence: Independent Comments: after removal of trial SCS, pt reports 2 major falls in last month, bruising to R knee after fall. LOC unsure about hitting head. independent with increased time when home alone for ADLs and IADLs. works from home on [employer]: Did the patient need help bathing, dressing, using the toilet or eating?  Independent   Indoor Mobility: Did the patient need assistance with walking from room to room (with or without device)? Independent   Stairs: Did the patient need assistance with internal or external stairs (with or without device)? Needed some help   Functional Cognition: Did the patient need help planning regular tasks such as shopping or remembering to take medications? Independent   Home Assistive Devices / Equipment Home Assistive Devices/Equipment: None Home Equipment: Walker - 4 wheels,Cane - quad,Grab bars - toilet,Shower seat,Grab bars - tub/shower,Hand held shower head,Bedside commode   Prior Device Use: Indicate devices/aids used by the patient prior to current illness, exacerbation or injury? None of the above   Current Functional Level Cognition   Overall Cognitive Status: Within Functional Limits for tasks assessed Orientation Level: Oriented X4     Extremity Assessment (includes Sensation/Coordination)   Upper Extremity Assessment: Overall WFL for tasks assessed  Lower Extremity Assessment: Defer to PT evaluation,RLE deficits/detail RLE Deficits / Details: reports pain with input to the sole RLE: Unable to fully assess due to pain RLE Sensation:  (pt reports numbness) RLE Coordination: decreased fine motor     ADLs   Overall ADL's : Needs assistance/impaired Eating/Feeding: Independent Eating/Feeding Details (indicate cue type and reason): reports nausea with all po intake. reports only keeping cracks and graham crackers down Grooming: Wash/dry hands,Set up,Sitting Grooming Details (indicate cue type and reason): pt able to complete ~15 minutes of standing activity at sink with weight shift to L foot and use of RW. pt able to release with bil UE and when reaching up for hair with immediate R LOB and Max (A) to catch. Lower Body Dressing: Total assistance Toilet Transfer: Minimal assistance,Ambulation,BSC,RW Toilet Transfer Details (indicate cue type and reason): pt on BSC upon arrival to room, minA for mobility around EOB to recliner, increased time/effort for mobility tasks Toileting- Clothing Manipulation and Hygiene: Sit to/from [place] -  Clothing Manipulation Details (indicate cue type and reason): pt performing anterior and posterior pericare in standing; minA for standing balance, increased time to perform Functional mobility during ADLs: Minimal assistance,Rolling walker General ADL Comments: pt sliding pillow on the floor under R foot     Mobility   Overal bed mobility: Needs Assistance Bed Mobility: Sit to Supine Supine to sit: Min assist Sit to supine: Mod assist,HOB elevated General bed mobility comments: OOB on BSC upon arrival     Transfers   Overall transfer level: Needs assistance Equipment used: Rolling walker (2 wheeled) Transfers: Sit to/from Stand Sit to Stand: Min  assist General transfer comment: Min assist for power up, rise and steady. standing from [facility]; increased time/effort to perform     Ambulation / Gait / Stairs / Wheelchair Mobility   Ambulation/Gait Ambulation/Gait assistance: Herbalist (Feet): 10 Feet (+3) Assistive device: Rolling walker (2 wheeled) Gait Pattern/deviations: Step-to pattern,Decreased step length - right,Decreased step length - left,Antalgic,Trunk flexed General Gait Details: Min assist to steady, guide RW. Pt requesting use of pillow under RLE to offweight and serve as a "ski" as pt is having difficulty performing hip and knee flexion during swing phase of gait. Pt ambulatory to bathroom, then walked 3 ft to sink before getting near syncopal requiring PT emergently calling RN staff into room to support pt with chair. Pt did not become completely syncopal. Gait velocity: decr Gait velocity interpretation: <1.31 ft/sec, indicative of household ambulator     Posture / Balance Balance Overall balance assessment: Needs assistance Sitting-balance support: Bilateral upper extremity supported,Feet supported Sitting balance-Leahy Scale: Fair Standing balance support: Bilateral upper extremity supported,During functional activity Standing balance-Leahy Scale: Poor Standing balance comment: reliant on at least single UE support and external assist     Special needs/care consideration Skin: Surgical Incision        Previous Home Environment (from acute therapy documentation) Living Arrangements: Parent Available Help at Discharge: Family,Available 24 hours/day Type of Home: House Home Layout: One level Home Access: Stairs to enter Entrance Stairs-Rails: Right,Left Entrance Stairs-Number of Steps: 2 (back deck has been redone to be more steps with shorter rise, maybe 15 stairs) Bathroom Shower/Tub: Optometrist: Yes Home Care Services: No Additional Comments:  27 pound cat name Brewing technologist  (real name [name]) / power chair at home   Discharge Living Setting Plans for Discharge Living Setting: Patient's home Type of Home at Discharge: House Discharge Home Layout: One level Discharge Home Access: Stairs to enter Entrance Stairs-Rails: Right,Left Entrance Stairs-Number of Steps: 2 Discharge Bathroom Shower/Tub: Tub/shower unit Discharge Bathroom Toilet: Standard Discharge Bathroom Accessibility: Yes How Accessible: Accessible via wheelchair,Accessible via walker Does the patient have any problems obtaining your medications?: No   Social/Family/Support Systems Patient Roles: Parent Contact Information: [phone] Anticipated Caregiver: Desma Paganini Anticipated Caregiver's Contact Information: [phone] Ability/Limitations of Caregiver: Pt's father is 37 but in very good health Caregiver Availability: 24/7 Discharge Plan Discussed with Primary Caregiver: Yes Is Caregiver In Agreement with Plan?: Yes Does Caregiver/Family have Issues with Lodging/Transportation while Pt is in Rehab?: No     Goals Patient/Family Goal for Rehab: PT/OT supervision/min A Expected length of stay: 14-17 days Pt/Family Agrees to Admission and willing to participate: Yes Program Orientation Provided & Reviewed with Pt/Caregiver Including Roles  & Responsibilities: Yes     Decrease burden of Care through IP rehab admission: Specialzed equipment needs, Decrease number of caregivers, Bowel and bladder program and Patient/family education  Possible need for SNF placement upon discharge: Not anticipated     Patient Condition: This patient's medical and functional status has changed since the consult dated: 08/14/2020 in which the Rehabilitation Physician determined and documented that the patient's condition is appropriate for intensive rehabilitative care in an inpatient rehabilitation facility. See "History of Present Illness" (above) for medical update.  Functional changes are: pt has been evaluated by OT with recommendations for CIR; pt has progressed in transfers from mod A to Min A and progressed in ambulation distance from Mod A for 3 feet with Min A for 10 feet (+3). Patient's medical and functional status update has been discussed with the Rehabilitation physician and patient remains appropriate for inpatient rehabilitation. Will admit to inpatient rehab 08/18/20.   Preadmission Screen Completed By:  Clemens Catholic With day of admit updates completed by Raechel Ache, OTR/L 08/18/2020 11:35 AM  ______________________________________________________________________   Discussed status with Dr. Posey Pronto on 08/18/20 at 11:35AM and received approval for admission today.   Admission Coordinator:  Clemens Catholic, With day of admit updates completed by Raechel Ache, OTR/L at time 10:05AM/Date 08/18/20.             Revision History                                                                                        Routing History                         Note Details   Author Posey Pronto, Domenick Bookbinder, MD File Time 08/18/2020 11:38 AM  Author Type Physician Status Addendum  Last Editor Vanessa Arn, MD Service Physical Medicine and [place] # [id number] Admit Date 08/11/2020                  Note Details  Author Vanessa Arn, MD File Time 08/18/2020 11:52 AM  Author Type Physician Status Signed  Last Editor Vanessa Arn, MD Service Physical Medicine and Rehabilitation

## 2020-08-18 NOTE — PMR Pre-admission (Signed)
Vanessa Arn, MD  Physician  Physical Medicine and Rehabilitation  PMR Pre-admission     Addendum  Date of Service:  08/14/2020  3:02 PM              PMR Admission Coordinator Pre-Admission Assessment   Patient: Vanessa Medina is an 49 y.o., female MRN: [id number] DOB: 1970/09/15 Height: 5' 4"  (162.6 cm) Weight: 109.3 kg                                                                                                                                                  Insurance Information HMO:     PPO:      PCP:      IPA:      80/20:      OTHER:  PRIMARY: [place]     Policy#: [id number]      Subscriber: [place] Name: Vanessa Medina      Phone#: [phone]    Fax#: [phone] Pre-Cert#:  [id number].      Employer: [place] provided by [employer] for admit to [employer] Auth provided on 08/17/20. Clinical updates are due to [facility] in 7 days. (p): [phone] (f): [phone] Benefits:  Phone #: online      Name: [email or web address]  Eff. Date: 09/05/2019 - 09/03/2020  Deductible: $750 ($750 met) OOP Max: $2,500 ($2,500 met - includes deductible) CIR: 10%co-insurance After you pay the deductible. SNF:  10%co-insurance After you pay the deductible. Outpatient: 10%coinsurance After you pay the deductible. Home Health: 10%co-insurance After you pay the deductible. DME: 10%coinsurance After you pay the deductible.  Providers: In network  SECONDARY:     None    The "Data Collection Information Summary" for patients in Inpatient Rehabilitation Facilities with attached "Privacy Act [place] Records" was provided and verbally reviewed with: N/A   Emergency Contact Information         Contact Information     Name Relation Home Work Mobile    Pacheco,Vanessa Medina [phone]           Current Medical History  Patient Admitting Diagnosis: Spinal Cord Injury History of Present Illness: The pt is a 49 yo female presenting after acute onset weakness and pain in  her RLE in pacu at surgical center for attempted spinal cord stimulator placement. PMH includes: multiple spine surgeries and multi-level fusion, with most recent operation one year ago.  Anemia, GERD, HTN, obesity, and chronic back pain. Glasgow Coma Scale Score: 15   Past Medical History      Past Medical History:  Diagnosis Date  . Anemia    . Cervical cancer ([place])    . Family history of adverse reaction to anesthesia       " my mother takes a long time time wake up"  . GERD (gastroesophageal reflux disease)    . Headache  migraine  . History of shingles    . Hypertension    . Migraine    . Multiple allergies    . Obesity    . Osteoarthritis      left hip  . PONV (postoperative nausea and vomiting)    . Sleep apnea      does not wear CPAP  . Wears glasses        Family History  family history includes Breast cancer in her cousin; Diabetes in an other family member; Heart attack (age of onset: 47) in her brother; Heart disease in her mother; Heart failure in her mother; Hypertension in her mother; Renal Disease in her mother; Stroke in her brother; Sudden Cardiac Death in her mother; Valvular heart disease in her mother.   Prior Rehab/Hospitalizations:  Has the patient had prior rehab or hospitalizations prior to admission? Yes   Has the patient had major surgery during 100 days prior to admission? Yes   Current Medications    Current Facility-Administered Medications:  .  0.9 %  sodium chloride infusion, , Intravenous, Continuous, Vanessa Medina, Vanessa Cornfield, NP, Last Rate: 50 mL/hr at 08/17/20 0600, Infusion Verify at 08/17/20 0600 .  acetaminophen (TYLENOL) tablet 650 mg, 650 mg, Oral, Q6H PRN, 650 mg at 08/17/20 1417 **OR** acetaminophen (TYLENOL) suppository 650 mg, 650 mg, Rectal, Q6H PRN, Vanessa Medina, Vanessa Cornfield, NP .  bisacodyl (DULCOLAX) EC tablet 5 mg, 5 mg, Oral, Daily PRN, Vanessa Medina, Vanessa Cornfield, NP, 5 mg at 08/18/20 0902 .  dexamethasone (DECADRON) injection 2  mg, 2 mg, Intravenous, Q6H, Vanessa Moore, MD, 2 mg at 08/18/20 0547 .  diphenhydrAMINE (BENADRYL) capsule 25 mg, 25 mg, Oral, Q6H PRN, Vanessa Agar, MD .  enoxaparin (LOVENOX) injection 30 mg, 30 mg, Subcutaneous, Daily, Vanessa Medina, Vanessa Medina, RPH, 30 mg at 08/18/20 0904 .  fludrocortisone (FLORINEF) tablet 0.1 mg, 0.1 mg, Oral, Daily, Vanessa Medina, Vanessa Cornfield, NP, 0.1 mg at 08/18/20 0902 .  gabapentin (NEURONTIN) capsule 400 mg, 400 mg, Oral, TID, Vanessa Agar, MD, 400 mg at 08/18/20 0902 .  HYDROmorphone (DILAUDID) injection 1-1.5 mg, 1-1.5 mg, Intravenous, Q2H PRN, Vanessa Agar, MD, 1 mg at 08/15/20 1225 .  HYDROmorphone (DILAUDID) tablet 4 mg, 4 mg, Oral, Q3H PRN, Vanessa Agar, MD, 4 mg at 08/16/20 2155 .  methocarbamol (ROBAXIN) tablet 750 mg, 750 mg, Oral, Q6H PRN, Vanessa Agar, MD .  metoCLOPramide (REGLAN) tablet 10 mg, 10 mg, Oral, TID AC, Vanessa Agar, MD, 10 mg at 08/18/20 0902 .  midodrine (PROAMATINE) tablet 10 mg, 10 mg, Oral, TID WC, Vanessa Medina, Vanessa Cornfield, NP, 10 mg at 08/18/20 0902 .  naloxone [facility]) injection 0.4 mg, 0.4 mg, Intravenous, PRN, Vanessa Agar, MD .  ondansetron [facility]) injection 4 mg, 4 mg, Intravenous, Q8H, Vanessa Medina, Vanessa Cornfield, NP, 4 mg at 08/18/20 0547 .  pantoprazole (PROTONIX) EC tablet 20 mg, 20 mg, Oral, Daily, Vanessa Agar, MD, 20 mg at 08/18/20 0902 .  polyethylene glycol (MIRALAX / GLYCOLAX) packet 17 g, 17 g, Oral, Daily PRN, Vanessa Medina, Vanessa Cornfield, NP, 17 g at 08/17/20 0836 .  pramipexole (MIRAPEX) tablet 0.5 mg, 0.5 mg, Oral, QHS, Vanessa Medina, Vanessa Cornfield, NP, 0.5 mg at 08/17/20 2135 .  topiramate (TOPAMAX) tablet 25 mg, 25 mg, Oral, BID, Vanessa Medina, Vanessa Cornfield, NP, 25 mg at 08/18/20 7782   Patients Current Diet:     Diet Order  Diet regular Room service appropriate? Yes; Fluid consistency: Thin  Diet effective now                      Precautions / Restrictions Precautions Precautions:  Fall Precaution Comments: watch BP Restrictions Weight Bearing Restrictions: No Other Position/Activity Restrictions: RLE limited by significant pain    Has the patient had 2 or more falls or a fall with injury in the past year?Yes   Prior Activity Level Community (5-7x/wk): Pt. was active in community PTA   Prior Functional Level Prior Function Level of Independence: Independent Comments: after removal of trial SCS, pt reports 2 major falls in last month, bruising to R knee after fall. LOC unsure about hitting head. independent with increased time when home alone for ADLs and IADLs. works from home on [place]: Did the patient need help bathing, dressing, using the toilet or eating?  Independent   Indoor Mobility: Did the patient need assistance with walking from room to room (with or without device)? Independent   Stairs: Did the patient need assistance with internal or external stairs (with or without device)? Needed some help   Functional Cognition: Did the patient need help planning regular tasks such as shopping or remembering to take medications? Independent   Home Assistive Devices / Equipment Home Assistive Devices/Equipment: None Home Equipment: Walker - 4 wheels,Cane - quad,Grab bars - toilet,Shower seat,Grab bars - tub/shower,Hand held shower head,Bedside commode   Prior Device Use: Indicate devices/aids used by the patient prior to current illness, exacerbation or injury? None of the above   Current Functional Level Cognition   Overall Cognitive Status: Within Functional Limits for tasks assessed Orientation Level: Oriented X4    Extremity Assessment (includes Sensation/Coordination)   Upper Extremity Assessment: Overall WFL for tasks assessed  Lower Extremity Assessment: Defer to PT evaluation,RLE deficits/detail RLE Deficits / Details: reports pain with input to the sole RLE: Unable to fully assess due to pain RLE Sensation:  (pt reports  numbness) RLE Coordination: decreased fine motor     ADLs   Overall ADL's : Needs assistance/impaired Eating/Feeding: Independent Eating/Feeding Details (indicate cue type and reason): reports nausea with all po intake. reports only keeping cracks and graham crackers down Grooming: Wash/dry hands,Set up,Sitting Grooming Details (indicate cue type and reason): pt able to complete ~15 minutes of standing activity at sink with weight shift to L foot and use of RW. pt able to release with bil UE and when reaching up for hair with immediate R LOB and Max (A) to catch. Lower Body Dressing: Total assistance Toilet Transfer: Minimal assistance,Ambulation,BSC,RW Toilet Transfer Details (indicate cue type and reason): pt on BSC upon arrival to room, minA for mobility around EOB to recliner, increased time/effort for mobility tasks Toileting- Clothing Manipulation and Hygiene: Sit to/from stand,Minimal assistance Toileting - Clothing Manipulation Details (indicate cue type and reason): pt performing anterior and posterior pericare in standing; minA for standing balance, increased time to perform Functional mobility during ADLs: Minimal assistance,Rolling walker General ADL Comments: pt sliding pillow on the floor under R foot     Mobility   Overal bed mobility: Needs Assistance Bed Mobility: Sit to Supine Supine to sit: Min assist Sit to supine: Mod assist,HOB elevated General bed mobility comments: OOB on BSC upon arrival     Transfers   Overall transfer level: Needs assistance Equipment used: Rolling walker (2 wheeled) Transfers: Sit to/from Stand Sit to Stand: Min assist General transfer comment: Min assist  for power up, rise and steady. standing from [facility]; increased time/effort to perform     Ambulation / Gait / Stairs / Wheelchair Mobility   Ambulation/Gait Ambulation/Gait assistance: Herbalist (Feet): 10 Feet (+3) Assistive device: Rolling walker (2 wheeled) Gait  Pattern/deviations: Step-to pattern,Decreased step length - right,Decreased step length - left,Antalgic,Trunk flexed General Gait Details: Min assist to steady, guide RW. Pt requesting use of pillow under RLE to offweight and serve as a "ski" as pt is having difficulty performing hip and knee flexion during swing phase of gait. Pt ambulatory to bathroom, then walked 3 ft to sink before getting near syncopal requiring PT emergently calling RN staff into room to support pt with chair. Pt did not become completely syncopal. Gait velocity: decr Gait velocity interpretation: <1.31 ft/sec, indicative of household ambulator     Posture / Balance Balance Overall balance assessment: Needs assistance Sitting-balance support: Bilateral upper extremity supported,Feet supported Sitting balance-Leahy Scale: Fair Standing balance support: Bilateral upper extremity supported,During functional activity Standing balance-Leahy Scale: Poor Standing balance comment: reliant on at least single UE support and external assist     Special needs/care consideration Skin: Surgical Incision        Previous Home Environment (from acute therapy documentation) Living Arrangements: Parent Available Help at Discharge: Family,Available 24 hours/day Type of Home: House Home Layout: One level Home Access: Stairs to enter Entrance Stairs-Rails: Right,Left Entrance Stairs-Number of Steps: 2 (back deck has been redone to be more steps with shorter rise, maybe 15 stairs) Bathroom Shower/Tub: Optometrist: Yes Home Care Services: No Additional Comments: 27 pound cat name Brewing technologist  (real name [name]) / power chair at home   Discharge Living Setting Plans for Discharge Living Setting: Patient's home Type of Home at Discharge: House Discharge Home Layout: One level Discharge Home Access: Stairs to enter Entrance Stairs-Rails: Right,Left Entrance Stairs-Number of Steps:  2 Discharge Bathroom Shower/Tub: Tub/shower unit Discharge Bathroom Toilet: Standard Discharge Bathroom Accessibility: Yes How Accessible: Accessible via wheelchair,Accessible via walker Does the patient have any problems obtaining your medications?: No   Social/Family/Support Systems Patient Roles: Parent Contact Information: [phone] Anticipated Caregiver: Desma Paganini Anticipated Caregiver's Contact Information: [phone] Ability/Limitations of Caregiver: Pt's father is 9 but in very good health Caregiver Availability: 24/7 Discharge Plan Discussed with Primary Caregiver: Yes Is Caregiver In Agreement with Plan?: Yes Does Caregiver/Family have Issues with Lodging/Transportation while Pt is in Rehab?: No     Goals Patient/Family Goal for Rehab: PT/OT supervision/min A Expected length of stay: 14-17 days Pt/Family Agrees to Admission and willing to participate: Yes Program Orientation Provided & Reviewed with Pt/Caregiver Including Roles  & Responsibilities: Yes     Decrease burden of Care through IP rehab admission: Specialzed equipment needs, Decrease number of caregivers, Bowel and bladder program and Patient/family education     Possible need for SNF placement upon discharge: Not anticipated     Patient Condition: This patient's medical and functional status has changed since the consult dated: 08/14/2020 in which the Rehabilitation Physician determined and documented that the patient's condition is appropriate for intensive rehabilitative care in an inpatient rehabilitation facility. See "History of Present Illness" (above) for medical update. Functional changes are: pt has been evaluated by OT with recommendations for CIR; pt has progressed in transfers from mod A to Min A and progressed in ambulation distance from Mod A for 3 feet with Min A for 10 feet (+3). Patient's medical and functional status update has been discussed  with the Rehabilitation physician and patient  remains appropriate for inpatient rehabilitation. Will admit to inpatient rehab 08/18/20.   Preadmission Screen Completed By:  Clemens Catholic With day of admit updates completed by Raechel Ache, OTR/L 08/18/2020 11:35 AM  ______________________________________________________________________   Discussed status with Dr. Posey Pronto on 08/18/20 at 11:35AM and received approval for admission today.   Admission Coordinator:  Clemens Catholic, With day of admit updates completed by Raechel Ache, OTR/L at time 10:05AM/Date 08/18/20.           Revision History                                                 Routing History              Note Details  Author Posey Pronto, Domenick Bookbinder, MD File Time 08/18/2020 11:38 AM  Author Type Physician Status Addendum  Last Editor Vanessa Arn, MD Service Physical Medicine and [place] # [id number] Admit Date 08/11/2020

## 2020-08-18 NOTE — Progress Notes (Signed)
Inpatient Rehabilitation-Admissions Coordinator   Received medical clearance from attending service for admit to CIR today. Notified pt of bed offer and she has accepted. Reviewed insurance benefit letter and consent form. All questions answered. RN and TOC notified of plan for admit today.   Raechel Ache, OTR/L  Rehab Admissions Coordinator  [phone] 08/18/2020 11:08 AM

## 2020-08-18 NOTE — Progress Notes (Deleted)
Jamse Arn, MD  Physician  Physical Medicine and Rehabilitation  PMR Pre-admission     Addendum  Date of Service:  08/14/2020  3:02 PM              PMR Admission Coordinator Pre-Admission Assessment   Patient: Vanessa Medina is an 49 y.o., female MRN: [id number] DOB: 1970/09/30 Height: 5' 4"  (162.6 cm) Weight: 109.3 kg                                                                                                                                                  Insurance Information HMO:     PPO:      PCP:      IPA:      80/20:      OTHER:  PRIMARY: [place]     Policy#: [id number]      Subscriber: [place] Name: [name]      Phone#: [phone]    Fax#: [phone] Pre-Cert#:  [id number].      Employer: [place] provided by [employer] for admit to [employer] Auth provided on 08/17/20. Clinical updates are due to [facility] in 7 days. (p): [phone] (f): [phone] Benefits:  Phone #: online      Name: [email or web address]  Eff. Date: 09/05/2019 - 09/03/2020  Deductible: $750 ($750 met) OOP Max: $2,500 ($2,500 met - includes deductible) CIR: 10%co-insurance After you pay the deductible. SNF:  10%co-insurance After you pay the deductible. Outpatient: 10%coinsurance After you pay the deductible. Home Health: 10%co-insurance After you pay the deductible. DME: 10%coinsurance After you pay the deductible.  Providers: In network  SECONDARY:     None    The "Data Collection Information Summary" for patients in Inpatient Rehabilitation Facilities with attached "Privacy Act [place] Records" was provided and verbally reviewed with: N/A   Emergency Contact Information         Contact Information     Name Relation Home Work Mobile    Pacheco,Christian Son [phone]           Current Medical History  Patient Admitting Diagnosis: Spinal Cord Injury History of Present Illness: The pt is a 49 yo female presenting after acute onset weakness and pain in  her RLE in pacu at surgical center for attempted spinal cord stimulator placement. PMH includes: multiple spine surgeries and multi-level fusion, with most recent operation one year ago.  Anemia, GERD, HTN, obesity, and chronic back pain. Glasgow Coma Scale Score: 15   Past Medical History      Past Medical History:  Diagnosis Date  . Anemia    . Cervical cancer ([place])    . Family history of adverse reaction to anesthesia       " my mother takes a long time time wake up"  . GERD (gastroesophageal reflux disease)    . Headache  migraine  . History of shingles    . Hypertension    . Migraine    . Multiple allergies    . Obesity    . Osteoarthritis      left hip  . PONV (postoperative nausea and vomiting)    . Sleep apnea      does not wear CPAP  . Wears glasses        Family History  family history includes Breast cancer in her cousin; Diabetes in an other family member; Heart attack (age of onset: 76) in her brother; Heart disease in her mother; Heart failure in her mother; Hypertension in her mother; Renal Disease in her mother; Stroke in her brother; Sudden Cardiac Death in her mother; Valvular heart disease in her mother.   Prior Rehab/Hospitalizations:  Has the patient had prior rehab or hospitalizations prior to admission? Yes   Has the patient had major surgery during 100 days prior to admission? Yes   Current Medications    Current Facility-Administered Medications:  .  0.9 %  sodium chloride infusion, , Intravenous, Continuous, Meyran, Ocie Cornfield, NP, Last Rate: 50 mL/hr at 08/17/20 0600, Infusion Verify at 08/17/20 0600 .  acetaminophen (TYLENOL) tablet 650 mg, 650 mg, Oral, Q6H PRN, 650 mg at 08/17/20 1417 **OR** acetaminophen (TYLENOL) suppository 650 mg, 650 mg, Rectal, Q6H PRN, Meyran, Ocie Cornfield, NP .  bisacodyl (DULCOLAX) EC tablet 5 mg, 5 mg, Oral, Daily PRN, Meyran, Ocie Cornfield, NP, 5 mg at 08/18/20 0902 .  dexamethasone (DECADRON) injection 2  mg, 2 mg, Intravenous, Q6H, Eustace Moore, MD, 2 mg at 08/18/20 0547 .  diphenhydrAMINE (BENADRYL) capsule 25 mg, 25 mg, Oral, Q6H PRN, Reece Agar, MD .  enoxaparin (LOVENOX) injection 30 mg, 30 mg, Subcutaneous, Daily, Dang, Thuy D, RPH, 30 mg at 08/18/20 0904 .  fludrocortisone (FLORINEF) tablet 0.1 mg, 0.1 mg, Oral, Daily, Meyran, Ocie Cornfield, NP, 0.1 mg at 08/18/20 0902 .  gabapentin (NEURONTIN) capsule 400 mg, 400 mg, Oral, TID, Reece Agar, MD, 400 mg at 08/18/20 0902 .  HYDROmorphone (DILAUDID) injection 1-1.5 mg, 1-1.5 mg, Intravenous, Q2H PRN, Reece Agar, MD, 1 mg at 08/15/20 1225 .  HYDROmorphone (DILAUDID) tablet 4 mg, 4 mg, Oral, Q3H PRN, Reece Agar, MD, 4 mg at 08/16/20 2155 .  methocarbamol (ROBAXIN) tablet 750 mg, 750 mg, Oral, Q6H PRN, Reece Agar, MD .  metoCLOPramide (REGLAN) tablet 10 mg, 10 mg, Oral, TID AC, Reece Agar, MD, 10 mg at 08/18/20 0902 .  midodrine (PROAMATINE) tablet 10 mg, 10 mg, Oral, TID WC, Meyran, Ocie Cornfield, NP, 10 mg at 08/18/20 0902 .  naloxone [facility]) injection 0.4 mg, 0.4 mg, Intravenous, PRN, Reece Agar, MD .  ondansetron [facility]) injection 4 mg, 4 mg, Intravenous, Q8H, Meyran, Ocie Cornfield, NP, 4 mg at 08/18/20 0547 .  pantoprazole (PROTONIX) EC tablet 20 mg, 20 mg, Oral, Daily, Reece Agar, MD, 20 mg at 08/18/20 0902 .  polyethylene glycol (MIRALAX / GLYCOLAX) packet 17 g, 17 g, Oral, Daily PRN, Meyran, Ocie Cornfield, NP, 17 g at 08/17/20 0836 .  pramipexole (MIRAPEX) tablet 0.5 mg, 0.5 mg, Oral, QHS, Meyran, Ocie Cornfield, NP, 0.5 mg at 08/17/20 2135 .  topiramate (TOPAMAX) tablet 25 mg, 25 mg, Oral, BID, Meyran, Ocie Cornfield, NP, 25 mg at 08/18/20 2446   Patients Current Diet:     Diet Order  Diet regular Room service appropriate? Yes; Fluid consistency: Thin  Diet effective now                      Precautions / Restrictions Precautions Precautions:  Fall Precaution Comments: watch BP Restrictions Weight Bearing Restrictions: No Other Position/Activity Restrictions: RLE limited by significant pain    Has the patient had 2 or more falls or a fall with injury in the past year?Yes   Prior Activity Level Community (5-7x/wk): Pt. was active in community PTA   Prior Functional Level Prior Function Level of Independence: Independent Comments: after removal of trial SCS, pt reports 2 major falls in last month, bruising to R knee after fall. LOC unsure about hitting head. independent with increased time when home alone for ADLs and IADLs. works from home on [place]: Did the patient need help bathing, dressing, using the toilet or eating?  Independent   Indoor Mobility: Did the patient need assistance with walking from room to room (with or without device)? Independent   Stairs: Did the patient need assistance with internal or external stairs (with or without device)? Needed some help   Functional Cognition: Did the patient need help planning regular tasks such as shopping or remembering to take medications? Independent   Home Assistive Devices / Equipment Home Assistive Devices/Equipment: None Home Equipment: Walker - 4 wheels,Cane - quad,Grab bars - toilet,Shower seat,Grab bars - tub/shower,Hand held shower head,Bedside commode   Prior Device Use: Indicate devices/aids used by the patient prior to current illness, exacerbation or injury? None of the above   Current Functional Level Cognition   Overall Cognitive Status: Within Functional Limits for tasks assessed Orientation Level: Oriented X4    Extremity Assessment (includes Sensation/Coordination)   Upper Extremity Assessment: Overall WFL for tasks assessed  Lower Extremity Assessment: Defer to PT evaluation,RLE deficits/detail RLE Deficits / Details: reports pain with input to the sole RLE: Unable to fully assess due to pain RLE Sensation:  (pt reports  numbness) RLE Coordination: decreased fine motor     ADLs   Overall ADL's : Needs assistance/impaired Eating/Feeding: Independent Eating/Feeding Details (indicate cue type and reason): reports nausea with all po intake. reports only keeping cracks and graham crackers down Grooming: Wash/dry hands,Set up,Sitting Grooming Details (indicate cue type and reason): pt able to complete ~15 minutes of standing activity at sink with weight shift to L foot and use of RW. pt able to release with bil UE and when reaching up for hair with immediate R LOB and Max (A) to catch. Lower Body Dressing: Total assistance Toilet Transfer: Minimal assistance,Ambulation,BSC,RW Toilet Transfer Details (indicate cue type and reason): pt on BSC upon arrival to room, minA for mobility around EOB to recliner, increased time/effort for mobility tasks Toileting- Clothing Manipulation and Hygiene: Sit to/from stand,Minimal assistance Toileting - Clothing Manipulation Details (indicate cue type and reason): pt performing anterior and posterior pericare in standing; minA for standing balance, increased time to perform Functional mobility during ADLs: Minimal assistance,Rolling walker General ADL Comments: pt sliding pillow on the floor under R foot     Mobility   Overal bed mobility: Needs Assistance Bed Mobility: Sit to Supine Supine to sit: Min assist Sit to supine: Mod assist,HOB elevated General bed mobility comments: OOB on BSC upon arrival     Transfers   Overall transfer level: Needs assistance Equipment used: Rolling walker (2 wheeled) Transfers: Sit to/from Stand Sit to Stand: Min assist General transfer comment: Min assist  for power up, rise and steady. standing from [facility]; increased time/effort to perform     Ambulation / Gait / Stairs / Wheelchair Mobility   Ambulation/Gait Ambulation/Gait assistance: Herbalist (Feet): 10 Feet (+3) Assistive device: Rolling walker (2 wheeled) Gait  Pattern/deviations: Step-to pattern,Decreased step length - right,Decreased step length - left,Antalgic,Trunk flexed General Gait Details: Min assist to steady, guide RW. Pt requesting use of pillow under RLE to offweight and serve as a "ski" as pt is having difficulty performing hip and knee flexion during swing phase of gait. Pt ambulatory to bathroom, then walked 3 ft to sink before getting near syncopal requiring PT emergently calling RN staff into room to support pt with chair. Pt did not become completely syncopal. Gait velocity: decr Gait velocity interpretation: <1.31 ft/sec, indicative of household ambulator     Posture / Balance Balance Overall balance assessment: Needs assistance Sitting-balance support: Bilateral upper extremity supported,Feet supported Sitting balance-Leahy Scale: Fair Standing balance support: Bilateral upper extremity supported,During functional activity Standing balance-Leahy Scale: Poor Standing balance comment: reliant on at least single UE support and external assist     Special needs/care consideration Skin: Surgical Incision        Previous Home Environment (from acute therapy documentation) Living Arrangements: Parent Available Help at Discharge: Family,Available 24 hours/day Type of Home: House Home Layout: One level Home Access: Stairs to enter Entrance Stairs-Rails: Right,Left Entrance Stairs-Number of Steps: 2 (back deck has been redone to be more steps with shorter rise, maybe 15 stairs) Bathroom Shower/Tub: Optometrist: Yes Home Care Services: No Additional Comments: 27 pound cat name Brewing technologist  (real name [name]) / power chair at home   Discharge Living Setting Plans for Discharge Living Setting: Patient's home Type of Home at Discharge: House Discharge Home Layout: One level Discharge Home Access: Stairs to enter Entrance Stairs-Rails: Right,Left Entrance Stairs-Number of Steps:  2 Discharge Bathroom Shower/Tub: Tub/shower unit Discharge Bathroom Toilet: Standard Discharge Bathroom Accessibility: Yes How Accessible: Accessible via wheelchair,Accessible via walker Does the patient have any problems obtaining your medications?: No   Social/Family/Support Systems Patient Roles: Parent Contact Information: [phone] Anticipated Caregiver: Desma Paganini Anticipated Caregiver's Contact Information: [phone] Ability/Limitations of Caregiver: Pt's father is 74 but in very good health Caregiver Availability: 24/7 Discharge Plan Discussed with Primary Caregiver: Yes Is Caregiver In Agreement with Plan?: Yes Does Caregiver/Family have Issues with Lodging/Transportation while Pt is in Rehab?: No     Goals Patient/Family Goal for Rehab: PT/OT supervision/min A Expected length of stay: 14-17 days Pt/Family Agrees to Admission and willing to participate: Yes Program Orientation Provided & Reviewed with Pt/Caregiver Including Roles  & Responsibilities: Yes     Decrease burden of Care through IP rehab admission: Specialzed equipment needs, Decrease number of caregivers, Bowel and bladder program and Patient/family education     Possible need for SNF placement upon discharge: Not anticipated     Patient Condition: This patient's medical and functional status has changed since the consult dated: 08/14/2020 in which the Rehabilitation Physician determined and documented that the patient's condition is appropriate for intensive rehabilitative care in an inpatient rehabilitation facility. See "History of Present Illness" (above) for medical update. Functional changes are: pt has been evaluated by OT with recommendations for CIR; pt has progressed in transfers from mod A to Min A and progressed in ambulation distance from Mod A for 3 feet with Min A for 10 feet (+3). Patient's medical and functional status update has been discussed  with the Rehabilitation physician and patient  remains appropriate for inpatient rehabilitation. Will admit to inpatient rehab 08/18/20.   Preadmission Screen Completed By:  Clemens Catholic With day of admit updates completed by Raechel Ache, OTR/L 08/18/2020 11:35 AM  ______________________________________________________________________   Discussed status with Dr. Posey Pronto on 08/18/20 at 11:35AM and received approval for admission today.   Admission Coordinator:  Clemens Catholic, With day of admit updates completed by Raechel Ache, OTR/L at time 10:05AM/Date 08/18/20.           Revision History                                                 Routing History              Note Details  Author Posey Pronto, Domenick Bookbinder, MD File Time 08/18/2020 11:38 AM  Author Type Physician Status Addendum  Last Editor Jamse Arn, MD Service Physical Medicine and [place] # [id number] Admit Date 08/11/2020

## 2020-08-18 NOTE — H&P (Signed)
Physical Medicine and Rehabilitation Admission H&P    Chief Complaint  Patient presents with  . Functional deficits due to spinal cord injury    HPI: Vanessa Mirabella. Medina is a 49 year old female with history of HTN, migraines, morbid obesity--BMI-41, chronic hypotension, multiple back surgeries with chronic pain with LLE lumbar radiculopathy who was scheduled to have spinal cord stimulator placed by Dr. Davy Pique on 08/11/2020 but unable to perform surgery due to scar tissue.  History taken from patient and chart review.  Post op on awakening, she had excruciating pain RLE with plegia and and allodynia. She was admitted for work up by Dr. Ronnald Ramp from surgical center on 08/11/2020. Thoracic MRI done revealing subtle increased T2/STIR intensity distal cord at T12-L1 level suspicious for edema/acute spinal cord injury, no CSF leak as well as prior PLIF L2-L5 with residual foraminal protrusion L3/5 potentially irritating right L3 nerve. She was started on IV Dilaudid, gabapentin as well as IV Decadron for management of pain, headaches and severe neuropathy.  She continues to have pain in RLE but is having some motor return, has constant headache, decreased hearing in right> left ear, constipation with nausea as well as epigastric pain and weakness. Therapy ongoing and CIR recommended due to functional decline.  Of note, she reports 50 lbs weigh loss in the past 3-4 months--due to issues with N/V/intake. Has had two falls last month---no recall of incident leading to fall question due to syncope. Was in process of work up for renal disease. She has had issue with LLE weakness with pain for the past year and being followed by Dr. Ernestina Patches. Did well with stimulator trial.  Please see preadmission assessment from earlier today as well.   Review of Systems  Constitutional: Negative for chills and fever.  HENT: Negative for hearing loss and tinnitus.   Eyes: Negative for blurred vision and double vision.   Respiratory: Negative for cough and shortness of breath.   Cardiovascular: Negative for chest pain and palpitations.  Gastrointestinal: Positive for heartburn and nausea.  Genitourinary:       Reports hesitancy and  that she urinates once a day or every other day.   Musculoskeletal: Positive for back pain, falls (two in the past month) and myalgias.  Skin: Negative for itching and rash.  Neurological: Positive for dizziness, tingling, sensory change, focal weakness, weakness and headaches.  Psychiatric/Behavioral: The patient is nervous/anxious.   All other systems reviewed and are negative.    Past Medical History:  Diagnosis Date  . Anemia   . Cervical cancer ([place])   . Family history of adverse reaction to anesthesia     " my mother takes a long time time wake up"  . GERD (gastroesophageal reflux disease)   . Headache    migraine  . History of shingles   . Hypertension   . Migraine   . Multiple allergies   . Obesity   . Osteoarthritis    left hip  . PONV (postoperative nausea and vomiting)   . Sleep apnea    does not wear CPAP  . Wears glasses     Past Surgical History:  Procedure Laterality Date  . ABDOMINAL HYSTERECTOMY    . APPENDECTOMY    . BILIOPANCREATIC DIVERSION     with duodenal switch laparoscopic   . CHOLECYSTECTOMY    . DIAGNOSTIC LAPAROSCOPY     LOA  . ESOPHAGOGASTRODUODENOSCOPY (EGD) WITH PROPOFOL N/A 07/25/2017   Procedure: ESOPHAGOGASTRODUODENOSCOPY (EGD) WITH PROPOFOL;  Surgeon: Vira Agar,  Gavin Pound, MD;  Location: [facility];  Service: Endoscopy;  Laterality: N/A;  . KNEE ARTHROSCOPY    . LAPAROSCOPIC GASTRIC SLEEVE RESECTION WITH HIATAL HERNIA REPAIR    . TONSILLECTOMY    . TOTAL HIP ARTHROPLASTY Left 01/26/2016   Procedure: LEFT TOTAL HIP ARTHROPLASTY ANTERIOR APPROACH;  Surgeon: Leandrew Koyanagi, MD;  Location: [place];  Service: Orthopedics;  Laterality: Left;  . TRANSFORAMINAL LUMBAR INTERBODY FUSION (TLIF) WITH PEDICLE SCREW FIXATION 3 LEVEL   08/2019   Basil Dess, MD L2-L5    Family History  Problem Relation Age of Onset  . Renal Disease Mother   . Hypertension Mother   . Sudden Cardiac Death Mother   . Heart failure Mother   . Valvular heart disease Mother   . Heart disease Mother   . Stroke Brother   . Heart attack Brother 48  . Diabetes Other   . 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 Has not used walker or rollater since 12/2019. She  reports that she has never smoked. She has never used smokeless tobacco. She reports current alcohol use. She reports that she does not use drugs.   Allergies  Allergen Reactions  . Shellfish Allergy Anaphylaxis  . Meperidine Hcl Other (See Comments)    BRADYCARDIA  . Morphine Other (See Comments)    BRADYCARDIA  . Bee Pollen Other (See Comments)    Unknown  . Ivp Dye [Iodinated Diagnostic Agents] Other (See Comments)    Swelling   . Oxycodone Hives and Itching    Blisters on back  . Propoxyphene Nausea Only and Nausea And Vomiting  . Codeine Nausea And Vomiting  . Oxycodone-Acetaminophen Itching  . Pentazocine Lactate Nausea And Vomiting    REACTION: vomiting with Talwin NX  . Propoxyphene N-Acetaminophen Nausea And Vomiting    Facility-Administered Medications Prior to Admission  Medication Dose Route Frequency Provider Last Rate Last Admin  . diphenhydrAMINE (BENADRYL) capsule 25 mg  25 mg Oral Q6H PRN Reece Agar, MD        Medications Prior to Admission  Medication Sig Dispense Refill  . fludrocortisone (FLORINEF) 0.1 MG tablet Take 1 tablet (0.1 mg total) by mouth daily. 30 tablet 5  . Lifitegrast (XIIDRA) 5 % SOLN Place 1 drop into both eyes in the morning and at bedtime.     . midodrine (PROAMATINE) 10 MG tablet Take 1 tablet (10 mg total) by mouth 3 (three) times daily. 270 tablet 3  . pramipexole (MIRAPEX) 0.125 MG tablet Take 0.5 mg  by mouth at bedtime.    . topiramate (TOPAMAX) 25 MG tablet Take 50 mg by mouth at bedtime.    . celecoxib (CELEBREX) 200 MG capsule TAKE 1 CAPSULE BY MOUTH  TWICE DAILY (Patient not taking: No sig reported) 180 capsule 3  . [DISCONTINUED] HYDROcodone-acetaminophen (NORCO/VICODIN) 5-325 MG tablet Take 1 tablet by mouth every 6 (six) hours as needed for moderate pain. (Patient not taking: Reported on 08/12/2020) 30 tablet 0    Drug Regimen Review  Drug regimen was reviewed and remains appropriate with no significant issues identified  Home: Home Living Family/patient expects to be discharged to:: Private residence Living Arrangements: Parent Available Help at Discharge: Family,Available 24 hours/day Type of Home: House Home Access: Stairs to enter [employer] of Steps: 2 (back deck has been redone to  be more steps with shorter rise, maybe 15 stairs) Entrance Stairs-Rails: Right,Left Home Layout: One level Bathroom Shower/Tub: Chiropodist: Standard Bathroom Accessibility: Yes Home Equipment: Walker - 4 wheels,Cane - quad,Grab bars - toilet,Shower seat,Grab bars - tub/shower,Hand held shower head,Bedside commode Additional Comments: 27 pound cat name Butterbean  (real name [name]) / power chair at home   Functional History: Prior Function Level of Independence: Independent Comments: after removal of trial SCS, pt reports 2 major falls in last month, bruising to R knee after fall. LOC unsure about hitting head. independent with increased time when home alone for ADLs and IADLs. works from home on computer  Functional Status:  Mobility: Bed Mobility Overal bed mobility: Needs Assistance Bed Mobility: Sit to Supine Supine to sit: Min assist Sit to supine: Mod assist,HOB elevated General bed mobility comments: OOB on BSC upon arrival Transfers Overall transfer level: Needs assistance Equipment used: Rolling walker (2 wheeled) Transfers: Sit to/from  Stand Sit to Stand: Min assist General transfer comment: Min assist for power up, rise and steady. standing from [facility]; increased time/effort to perform Ambulation/Gait Ambulation/Gait assistance: Min assist Gait Distance (Feet): 10 Feet (+3) Assistive device: Rolling walker (2 wheeled) Gait Pattern/deviations: Step-to pattern,Decreased step length - right,Decreased step length - left,Antalgic,Trunk flexed General Gait Details: Min assist to steady, guide RW. Pt requesting use of pillow under RLE to offweight and serve as a "ski" as pt is having difficulty performing hip and knee flexion during swing phase of gait. Pt ambulatory to bathroom, then walked 3 ft to sink before getting near syncopal requiring PT emergently calling RN staff into room to support pt with chair. Pt did not become completely syncopal. Gait velocity: decr Gait velocity interpretation: <1.31 ft/sec, indicative of household ambulator    ADL: ADL Overall ADL's : Needs assistance/impaired Eating/Feeding: Independent Eating/Feeding Details (indicate cue type and reason): reports nausea with all po intake. reports only keeping cracks and graham crackers down Grooming: Wash/dry hands,Set up,Sitting Grooming Details (indicate cue type and reason): pt able to complete ~15 minutes of standing activity at sink with weight shift to L foot and use of RW. pt able to release with bil UE and when reaching up for hair with immediate R LOB and Max (A) to catch. Lower Body Dressing: Total assistance Toilet Transfer: Minimal assistance,Ambulation,BSC,RW Toilet Transfer Details (indicate cue type and reason): pt on BSC upon arrival to room, minA for mobility around EOB to recliner, increased time/effort for mobility tasks Toileting- Clothing Manipulation and Hygiene: Sit to/from stand,Minimal assistance Toileting - Clothing Manipulation Details (indicate cue type and reason): pt performing anterior and posterior pericare in standing; minA for  standing balance, increased time to perform Functional mobility during ADLs: Minimal assistance,Rolling walker General ADL Comments: pt sliding pillow on the floor under R foot  Cognition: Cognition Overall Cognitive Status: Within Functional Limits for tasks assessed Orientation Level: Oriented X4 Cognition Arousal/Alertness: Awake/alert Behavior During Therapy: WFL for tasks assessed/performed Overall Cognitive Status: Within Functional Limits for tasks assessed   Blood pressure 127/72, pulse (!) 47, temperature 97.6 F (36.4 C), resp. rate 18, height 5\' 4"  (1.626 m), weight 109.3 kg, SpO2 100 %. Physical Exam Vitals and nursing note reviewed.  Constitutional:      General: She is not in acute distress.    Appearance: Normal appearance. She is obese.     Comments: Anxious appearing female in darkened room due to light sensitivity/HA.   HENT:     Head: Normocephalic and atraumatic.  Right Ear: External ear normal.     Left Ear: External ear normal.     Nose: Nose normal.     Mouth/Throat:     Pharynx: No oropharyngeal exudate or posterior oropharyngeal erythema.  Eyes:     General:        Right eye: No discharge.        Left eye: No discharge.     Extraocular Movements: Extraocular movements intact.  Cardiovascular:     Rate and Rhythm: Normal rate and regular rhythm.  Pulmonary:     Effort: Pulmonary effort is normal. No respiratory distress.     Breath sounds: No stridor.  Abdominal:     General: Bowel sounds are normal.  Musculoskeletal:     Cervical back: Normal range of motion and neck supple.     Comments: Lower extremity edema  Skin:    General: Skin is warm and dry.     Comments: Incision with dressing CDI  Neurological:     Comments: Mild left facial weakness with ptosis and halting speech at times.--baseline per son.  Motor: RUE: 4 -/5 proximal distally in LUE:/5 proximal RLE: Blood, extension 3 5/5, ankle dorsiflexion 2-5/5 LLE: Hip flexion, knee  extension, dorsiflexion 4 -/5  Psychiatric:        Mood and Affect: Mood normal.        Behavior: Behavior normal.     No results found for this or any previous visit (from the past 48 hour(s)). No results found.     Medical Problem List and Plan: 1.  Deficits with mobility, transfers, endurance, self-care secondary to paraparesis.   -patient may shower with incision dressed  -ELOS/Goals: 10-14 days/supervision/mod I  Admit to CIR 2.  Antithrombotics: -DVT/anticoagulation:  Pharmaceutical: Lovenox  -antiplatelet therapy: N/A 3. Pain Management: On IV decadron, robaxin and tylenol prn.   --continue gabapentin 400 mg tid for neuropathy 4. Mood: LCSW to follow for evaluation and support.   -antipsychotic agents: N/A 5. Neuropsych: This patient is capable of making decisions on her own behalf. 6. Skin/Wound Care: Routine pressure relief measures.  7. Fluids/Electrolytes/Nutrition: Monitor I/Os. Discontinue IVF and encourage fluid intake.   CMP ordered for tomorrow 8. Migraines: Takes Topamax at bedtime. Now constant due to spinal cord injury. Supportive care recommended. 9. Constipation: Likely obstipation as has not had BM since admission.  KUB ordered to determine stool burden.    Continue Reglan order enema and augment bowel regimen.  10. Bradycardia: Question due to spinal injury--HR has in 40's but been as low as 30's.   ECG ordered 11. Chronic hypotension:  On midodrine tid with florinef per Dr. Rockey Situ.    Orthostatic vital signs ordered  Monitor with increased exertion 12. Morbid obesity s/p gastric sleeve: Encourage/educate on weight loss and dietary changes to help promote overall health and mobility.  13. Acute on chronic renal failure: Ultrasound 08/2020 showing medical renal disease. BUN/SCr-26/1.7 on 11/30. Had normal renal function a year ago. No labs since 12/08--BUN/SCr-44/3.40. Has been on IVF for hydration.   CMP ordered for tomorrow a.m.    Will also order PVRs  to rule out neurogenic bladder as cause.    Bary Leriche, PA-C 08/18/2020  I have personally performed a face to face diagnostic evaluation, including, but not limited to relevant history and physical exam findings, of this patient and developed relevant assessment and plan.  Additionally, I have reviewed and concur with the physician assistant's documentation above.  Delice Lesch, MD, ABPMR  The  patient's status has not changed. Any changes from the pre-admission screening or documentation from the acute chart are noted above.   Delice Lesch, MD, ABPMR

## 2020-08-18 NOTE — Progress Notes (Signed)
Received patient to room with floor RN in stable condition. Patient to chair without difficulty. Denies pain/current needs, call light within reach.

## 2020-08-18 NOTE — Progress Notes (Signed)
Patient lying in bed upon visit this morning.  Ear cerumen removed yesterday uneventfully.  Hearing improved.  Dizziness improved.  Patient also started on Reglan and Protonix.  Epigastric symptoms improved.  Patient able to eat yesterday.  No pain medication overnight.  Patient is noting new paresthesias in her right lower extremity.  Still weak throughout.  Patient did participate with  physical therapy yesterday however the she did have a presyncopal episode which limited participation.  She has been approved for rehab is now waiting on a bed.  No new changes in health history since last seen.  PE:  Appropriately conversive Limited movement of RLE, similar versus prior exam   A/P:  Pleasant female with RLE weakness, pain, slowly improving   1.  Patient to got to rehab when bed available.  2.  Continue current medications.

## 2020-08-18 NOTE — Progress Notes (Signed)
Initial Nutrition Assessment  DOCUMENTATION CODES:   Morbid obesity  INTERVENTION:  Provide Ensure Enlive po BID, each supplement provides 350 kcal and 20 grams of protein.  Encourage adequate PO intake.  NUTRITION DIAGNOSIS:   Increased nutrient needs related to acute illness as evidenced by estimated needs.  GOAL:   Patient will meet greater than or equal to 90% of their needs  MONITOR:   PO intake,Supplement acceptance,Skin,Weight trends,Labs,I & O's  REASON FOR ASSESSMENT:   Malnutrition Screening Tool    ASSESSMENT:   49 year old female s/p SCS placement 12/8 and woke up with right leg paraplegia at the surgery center. MRI showed signal change in his cord where the scs leads were implanted.  Pt reports appetite and po intake has improved. Meal completion 100%. Pt reports having a poor appetite prior to admission which had been ongoing over the past 2 months. Pt reports po intake very unpredictable and on occasions would only consume a couple of bites at meals. Per weight records, pt with a 7.5% weight loss in 2 months. Noted pt reports history of gastric sleeve surgery. RD to order nutritional supplements to aid in caloric and protein needs. Plans to discharge to CIR today.   NUTRITION - FOCUSED PHYSICAL EXAM:  Flowsheet Row Most Recent Value  Orbital Region Unable to assess  Upper Arm Region No depletion  Thoracic and Lumbar Region No depletion  Buccal Region Unable to assess  Temple Region Unable to assess  Clavicle Bone Region No depletion  Clavicle and Acromion Bone Region No depletion  Scapular Bone Region Unable to assess  Dorsal Hand Unable to assess  Patellar Region No depletion  Anterior Thigh Region No depletion  Posterior Calf Region No depletion  Edema (RD Assessment) None  Hair Reviewed  Eyes Reviewed  Mouth Reviewed  Skin Reviewed  Nails Unable to assess     Labs and medications reviewed.   Diet Order:   Diet Order            Diet - low  sodium heart healthy           Diet regular Room service appropriate? Yes; Fluid consistency: Thin  Diet effective now                 EDUCATION NEEDS:   Not appropriate for education at this time  Skin:  Skin Assessment: Skin Integrity Issues: Skin Integrity Issues:: Incisions Incisions: vertebral  Last BM:  12/15  Height:   Ht Readings from Last 1 Encounters:  08/11/20 5\' 4"  (1.626 m)    Weight:   Wt Readings from Last 1 Encounters:  08/11/20 109.3 kg   BMI:  Body mass index is 41.36 kg/m.  Estimated Nutritional Needs:   Kcal:  1800-2000  Protein:  90-105 grams  Fluid:  >/= 2 L/day  Corrin Parker, MS, RD, LDN RD pager number/after hours weekend pager number on Amion.

## 2020-08-18 NOTE — Progress Notes (Signed)
Patient Transferred to CIR RM 2  with all belongings. Patient RX was give to the nurse to hold in the chart until pt. DC home.

## 2020-08-18 NOTE — Progress Notes (Signed)
Vanessa Ribas, MD  Physician  Physical Medicine and Rehabilitation  Consult Note     Signed  Date of Service:  08/14/2020  4:23 PM      Related encounter: ED to Hosp-Admission (Current) from 08/11/2020 in [place]       Signed      Expand All Collapse All             Physical Medicine and Rehabilitation Consult Reason for Consult: Impaired mobility and ADLs secondary to distal spinal cord injury after attempted spinal cord stimulator placement Referring Physician: Sherley Bounds, MD     HPI: Vanessa Medina is a 49 y.o. female who suffered a spinal cord injury in her distal cord during an attempted spinal cord stimulator placement. She now has weakness and neuropathic pain in her right leg. Physical Medicine & Rehabilitation was consulted to assess candidacy for CIR. Patient is agreeable to pursuing CIR to improve her strength, balance, and ability to perform ADLs.      Review of Systems  Constitutional: Negative.   HENT: Negative.   Eyes: Negative.   Respiratory: Negative.   Cardiovascular: Negative.   Gastrointestinal: Negative.   Genitourinary: Negative.   Musculoskeletal: Positive for back pain and joint pain.  Skin: Negative.   Neurological: Positive for tingling, sensory change and headaches.  Endo/Heme/Allergies: Negative.   Psychiatric/Behavioral: Positive for depression.        Past Medical History:  Diagnosis Date  . Anemia    . Cervical cancer ([place])    . Family history of adverse reaction to anesthesia       " my mother takes a long time time wake up"  . GERD (gastroesophageal reflux disease)    . Headache      migraine  . History of shingles    . Hypertension    . Migraine    . Multiple allergies    . Obesity    . Osteoarthritis      left hip  . PONV (postoperative nausea and vomiting)    . Sleep apnea      does not wear CPAP  . Wears glasses           Past Surgical History:  Procedure Laterality Date  .  ABDOMINAL HYSTERECTOMY      . APPENDECTOMY      . BILIOPANCREATIC DIVERSION        with duodenal switch laparoscopic   . CHOLECYSTECTOMY      . DIAGNOSTIC LAPAROSCOPY        LOA  . ESOPHAGOGASTRODUODENOSCOPY (EGD) WITH PROPOFOL N/A 07/25/2017    Procedure: ESOPHAGOGASTRODUODENOSCOPY (EGD) WITH PROPOFOL;  Surgeon: Manya Silvas, MD;  Location: [facility] ENDOSCOPY;  Service: Endoscopy;  Laterality: N/A;  . KNEE ARTHROSCOPY      . LAPAROSCOPIC GASTRIC SLEEVE RESECTION WITH HIATAL HERNIA REPAIR      . TONSILLECTOMY      . TOTAL HIP ARTHROPLASTY Left 01/26/2016    Procedure: LEFT TOTAL HIP ARTHROPLASTY ANTERIOR APPROACH;  Surgeon: Leandrew Koyanagi, MD;  Location: [facility];  Service: Orthopedics;  Laterality: Left;  . TRANSFORAMINAL LUMBAR INTERBODY FUSION (TLIF) WITH PEDICLE SCREW FIXATION 3 LEVEL   08/2019    Basil Dess, MD L2-L5         Family History  Problem Relation Age of Onset  . Renal Disease Mother    . Hypertension Mother    . Sudden Cardiac Death Mother    . Heart failure Mother    .  Valvular heart disease Mother    . Heart disease Mother    . Stroke Brother    . Heart attack Brother 52  . Diabetes Other    . Breast cancer Cousin          paternal side    Social History:  reports that she has never smoked. She has never used smokeless tobacco. She reports current alcohol use. She reports that she does not use drugs. Allergies:       Allergies  Allergen Reactions  . Shellfish Allergy Anaphylaxis  . Meperidine Hcl Other (See Comments)      BRADYCARDIA  . Morphine Other (See Comments)      BRADYCARDIA  . Bee Pollen Other (See Comments)      Unknown  . Ivp Dye [Iodinated Diagnostic Agents] Other (See Comments)      Swelling   . Oxycodone Hives and Itching      Blisters on back  . Propoxyphene Nausea Only and Nausea And Vomiting  . Codeine Nausea And Vomiting  . Oxycodone-Acetaminophen Itching  . Pentazocine Lactate Nausea And Vomiting      REACTION: vomiting with [place]  NX  . Propoxyphene N-Acetaminophen Nausea And Vomiting             Facility-Administered Medications Prior to Admission  Medication Dose Route Frequency Provider Last Rate Last Admin  . diphenhydrAMINE (BENADRYL) capsule 25 mg  25 mg Oral Q6H PRN Reece Agar, MD              Medications Prior to Admission  Medication Sig Dispense Refill  . fludrocortisone (FLORINEF) 0.1 MG tablet Take 1 tablet (0.1 mg total) by mouth daily. 30 tablet 5  . Lifitegrast (XIIDRA) 5 % SOLN Place 1 drop into both eyes in the morning and at bedtime.       . midodrine (PROAMATINE) 10 MG tablet Take 1 tablet (10 mg total) by mouth 3 (three) times daily. 270 tablet 3  . pramipexole (MIRAPEX) 0.125 MG tablet Take 0.5 mg by mouth at bedtime.      . topiramate (TOPAMAX) 25 MG tablet Take 50 mg by mouth at bedtime.      . celecoxib (CELEBREX) 200 MG capsule TAKE 1 CAPSULE BY MOUTH  TWICE DAILY (Patient not taking: No sig reported) 180 capsule 3  . HYDROcodone-acetaminophen (NORCO/VICODIN) 5-325 MG tablet Take 1 tablet by mouth every 6 (six) hours as needed for moderate pain. (Patient not taking: Reported on 08/12/2020) 30 tablet 0      Home: Home Living Family/patient expects to be discharged to:: Private residence Living Arrangements: Parent Available Help at Discharge: Family,Available 24 hours/day Type of Home: House Home Access: Stairs to enter [employer] of Steps: 2 (back deck has been redone to be more steps with shorter rise, maybe 15 stairs) Entrance Stairs-Rails: Right,Left Home Layout: One level Bathroom Shower/Tub: Chiropodist: Standard Bathroom Accessibility: Yes Home Equipment: Walker - 4 wheels,Cane - quad,Grab bars - toilet,Shower seat,Grab bars - tub/shower,Hand held shower head,Bedside commode  Functional History: Prior Function Level of Independence: Independent Comments: after removal of trial SCS, pt reports 2 major falls in last month, bruising to R knee  after fall. LOC unsure about hitting head. independent with increased time when home alone for ADLs and IADLs. works from home on computer Functional Status:  Mobility: Bed Mobility Overal bed mobility: Needs Assistance Bed Mobility: Supine to Sit,Sit to Supine Supine to sit: Min assist Sit to supine: Min assist General bed  mobility comments: minA to manage RLE with pillow. pt able to move well without significant assist Transfers Overall transfer level: Needs assistance Equipment used: Rolling walker (2 wheeled) Transfers: Sit to/from Stand Sit to Stand: Mod assist General transfer comment: modA to power up and steady with pillow under RLE to reduce contact and pain. significant reliance on BUE on RW Ambulation/Gait Ambulation/Gait assistance: Mod assist Gait Distance (Feet): 3 Feet Assistive device: Rolling walker (2 wheeled) Gait Pattern/deviations: Step-to pattern,Decreased stride length General Gait Details: small lateral steps with modA to steady, manage RW, and verbal cues for each step, pt able to slide RLE laterally on pillow and move LLE with increased WB through UE Gait velocity: decreased Gait velocity interpretation: <1.31 ft/sec, indicative of household ambulator   ADL:   Cognition: Cognition Overall Cognitive Status: Within Functional Limits for tasks assessed Orientation Level: Oriented X4 Cognition Arousal/Alertness: Awake/alert Behavior During Therapy: WFL for tasks assessed/performed Overall Cognitive Status: Within Functional Limits for tasks assessed   Blood pressure 137/68, pulse (!) 49, temperature (!) 97.5 F (36.4 C), temperature source Oral, resp. rate 19, height 5\' 4"  (1.626 m), weight 109.3 kg, SpO2 92 %. Physical Exam  General: Alert and oriented x 3, No apparent distress HEENT: Head is normocephalic, atraumatic, PERRLA, EOMI, sclera anicteric, oral mucosa pink and moist, dentition intact, ext ear canals clear,  Neck: Supple without JVD or  lymphadenopathy Heart: Reg rate and rhythm. No murmurs rubs or gallops Chest: CTA bilaterally without wheezes, rales, or rhonchi; no distress Abdomen: Soft, non-tender, non-distended, bowel sounds positive. Extremities: No clubbing, cyanosis, or edema. Pulses are 2+ Skin: Clean and intact without signs of breakdown Neuro: Pt is cognitively appropriate with normal insight, memory, and awareness. Cranial nerves 2-12 are intact. Significant weakness RLE, currently limited by pain.  Psych: Pt's affect is appropriate. Pt is cooperative     Lab Results Last 24 Hours  No results found for this or any previous visit (from the past 24 hour(s)).   Imaging Results (Last 48 hours)  No results found.       Assessment/Plan: Diagnosis: Acute spinal cord injury, T12-L1 1. Does the need for close, 24 hr/day medical supervision in concert with the patient's rehab needs make it unreasonable for this patient to be served in a less intensive setting? Yes 2. Co-Morbidities requiring supervision/potential complications: cervical cancer, morbid obesity (BMI 41.36), depression, common migraine, asthma 3. Due to bladder management, bowel management, safety, skin/wound care, disease management, medication administration, pain management and patient education, does the patient require 24 hr/day rehab nursing? Yes 4. Does the patient require coordinated care of a physician, rehab nurse, therapy disciplines of PT, OT to address physical and functional deficits in the context of the above medical diagnosis(es)? Yes Addressing deficits in the following areas: balance, endurance, locomotion, strength, transferring, bowel/bladder control, bathing, dressing, feeding, grooming, toileting and psychosocial support 5. Can the patient actively participate in an intensive therapy program of at least 3 hrs of therapy per day at least 5 days per week? Yes 6. The potential for patient to make measurable gains while on inpatient rehab  is excellent 7. Anticipated functional outcomes upon discharge from inpatient rehab are modified independent  with PT, modified independent with OT, independent with SLP. 8. Estimated rehab length of stay to reach the above functional goals is: 10-14 days 9. Anticipated discharge destination: Home 10. Overall Rehab/Functional Prognosis: excellent   RECOMMENDATIONS: This patient's condition is appropriate for continued rehabilitative care in the following setting: CIR Patient has  agreed to participate in recommended program. Yes Note that insurance prior authorization may be required for reimbursement for recommended care.   Comment:  1) Acute spinal cord injury T12-L1: Continue acute PT and OT 2) Right lower extremity neuropathic pain: Currently receiving IV dilaudid and would need to be transitioned to po medication prior to CIR. Can consider increasing Gabapentin to 400mg  TID.  3) Right lower extremity weakness affecting mobility and ADLs: admit to CIR once insurance approval obtained and bed available and no longer requiring IV dilaudid.  4) Urinary retention: appreciate nursing encouragement and education regarding importance of voiding, continue encouraging bedside commode.    Thank you for this consult. Admission coordinator to follow.    I have personally performed a face to face diagnostic evaluation, including, but not limited to relevant history and physical exam findings, of this patient and developed relevant assessment and plan.  Additionally, I have reviewed and concur with the physician assistant's documentation above.   Leeroy Cha, MD             Routing History                   Note Details  Author Ranell Patrick, Clide Deutscher, MD File Time 08/14/2020  4:35 PM  Author Type Physician Status Signed  Last Editor Vanessa Ribas, MD Service Physical Medicine and Rehabilitation

## 2020-08-18 NOTE — PMR Pre-admission (Deleted)
Jamse Arn, MD  Physician  Physical Medicine and Rehabilitation  PMR Pre-admission     Addendum  Date of Service:  08/14/2020  3:02 PM              PMR Admission Coordinator Pre-Admission Assessment   Patient: Vanessa Medina is an 49 y.o., female MRN: [id number] DOB: 01-Mar-1971 Height: 5' 4"  (162.6 cm) Weight: 109.3 kg                                                                                                                                                  Insurance Information HMO:     PPO:      PCP:      IPA:      80/20:      OTHER:  PRIMARY: [place]     Policy#: [id number]      Subscriber: [place] Name: [name]      Phone#: [phone]    Fax#: [phone] Pre-Cert#:  [id number].      Employer: [employer] provided by [employer] for admit to [employer] Auth provided on 08/17/20. Clinical updates are due to [facility] in 7 days. (p): [phone] (f): [phone] Benefits:  Phone #: online      Name: [email or web address]  Eff. Date: 09/05/2019 - 09/03/2020  Deductible: $750 ($750 met) OOP Max: $2,500 ($2,500 met - includes deductible) CIR: 10%co-insurance After you pay the deductible. SNF:  10%co-insurance After you pay the deductible. Outpatient: 10%coinsurance After you pay the deductible. Home Health: 10%co-insurance After you pay the deductible. DME: 10%coinsurance After you pay the deductible.  Providers: In network  SECONDARY:     None    The "Data Collection Information Summary" for patients in Inpatient Rehabilitation Facilities with attached "Privacy Act [place] Records" was provided and verbally reviewed with: N/A   Emergency Contact Information         Contact Information     Name Relation Home Work Mobile    Pacheco,Christian Son [phone]           Current Medical History  Patient Admitting Diagnosis: Spinal Cord Injury History of Present Illness: The pt is a 49 yo female presenting after acute onset weakness and pain in  her RLE in pacu at surgical center for attempted spinal cord stimulator placement. PMH includes: multiple spine surgeries and multi-level fusion, with most recent operation one year ago.  Anemia, GERD, HTN, obesity, and chronic back pain. Glasgow Coma Scale Score: 15   Past Medical History      Past Medical History:  Diagnosis Date  . Anemia    . Cervical cancer ([place])    . Family history of adverse reaction to anesthesia       " my mother takes a long time time wake up"  . GERD (gastroesophageal reflux disease)    . Headache  migraine  . History of shingles    . Hypertension    . Migraine    . Multiple allergies    . Obesity    . Osteoarthritis      left hip  . PONV (postoperative nausea and vomiting)    . Sleep apnea      does not wear CPAP  . Wears glasses        Family History  family history includes Breast cancer in her cousin; Diabetes in an other family member; Heart attack (age of onset: 15) in her brother; Heart disease in her mother; Heart failure in her mother; Hypertension in her mother; Renal Disease in her mother; Stroke in her brother; Sudden Cardiac Death in her mother; Valvular heart disease in her mother.   Prior Rehab/Hospitalizations:  Has the patient had prior rehab or hospitalizations prior to admission? Yes   Has the patient had major surgery during 100 days prior to admission? Yes   Current Medications    Current Facility-Administered Medications:  .  0.9 %  sodium chloride infusion, , Intravenous, Continuous, Meyran, Ocie Cornfield, NP, Last Rate: 50 mL/hr at 08/17/20 0600, Infusion Verify at 08/17/20 0600 .  acetaminophen (TYLENOL) tablet 650 mg, 650 mg, Oral, Q6H PRN, 650 mg at 08/17/20 1417 **OR** acetaminophen (TYLENOL) suppository 650 mg, 650 mg, Rectal, Q6H PRN, Meyran, Ocie Cornfield, NP .  bisacodyl (DULCOLAX) EC tablet 5 mg, 5 mg, Oral, Daily PRN, Meyran, Ocie Cornfield, NP, 5 mg at 08/18/20 0902 .  dexamethasone (DECADRON) injection 2  mg, 2 mg, Intravenous, Q6H, Eustace Moore, MD, 2 mg at 08/18/20 0547 .  diphenhydrAMINE (BENADRYL) capsule 25 mg, 25 mg, Oral, Q6H PRN, Reece Agar, MD .  enoxaparin (LOVENOX) injection 30 mg, 30 mg, Subcutaneous, Daily, Dang, Thuy D, RPH, 30 mg at 08/18/20 0904 .  fludrocortisone (FLORINEF) tablet 0.1 mg, 0.1 mg, Oral, Daily, Meyran, Ocie Cornfield, NP, 0.1 mg at 08/18/20 0902 .  gabapentin (NEURONTIN) capsule 400 mg, 400 mg, Oral, TID, Reece Agar, MD, 400 mg at 08/18/20 0902 .  HYDROmorphone (DILAUDID) injection 1-1.5 mg, 1-1.5 mg, Intravenous, Q2H PRN, Reece Agar, MD, 1 mg at 08/15/20 1225 .  HYDROmorphone (DILAUDID) tablet 4 mg, 4 mg, Oral, Q3H PRN, Reece Agar, MD, 4 mg at 08/16/20 2155 .  methocarbamol (ROBAXIN) tablet 750 mg, 750 mg, Oral, Q6H PRN, Reece Agar, MD .  metoCLOPramide (REGLAN) tablet 10 mg, 10 mg, Oral, TID AC, Reece Agar, MD, 10 mg at 08/18/20 0902 .  midodrine (PROAMATINE) tablet 10 mg, 10 mg, Oral, TID WC, Meyran, Ocie Cornfield, NP, 10 mg at 08/18/20 0902 .  naloxone [facility]) injection 0.4 mg, 0.4 mg, Intravenous, PRN, Reece Agar, MD .  ondansetron [facility]) injection 4 mg, 4 mg, Intravenous, Q8H, Meyran, Ocie Cornfield, NP, 4 mg at 08/18/20 0547 .  pantoprazole (PROTONIX) EC tablet 20 mg, 20 mg, Oral, Daily, Reece Agar, MD, 20 mg at 08/18/20 0902 .  polyethylene glycol (MIRALAX / GLYCOLAX) packet 17 g, 17 g, Oral, Daily PRN, Meyran, Ocie Cornfield, NP, 17 g at 08/17/20 0836 .  pramipexole (MIRAPEX) tablet 0.5 mg, 0.5 mg, Oral, QHS, Meyran, Ocie Cornfield, NP, 0.5 mg at 08/17/20 2135 .  topiramate (TOPAMAX) tablet 25 mg, 25 mg, Oral, BID, Meyran, Ocie Cornfield, NP, 25 mg at 08/18/20 3267   Patients Current Diet:     Diet Order  Diet regular Room service appropriate? Yes; Fluid consistency: Thin  Diet effective now                      Precautions / Restrictions Precautions Precautions:  Fall Precaution Comments: watch BP Restrictions Weight Bearing Restrictions: No Other Position/Activity Restrictions: RLE limited by significant pain    Has the patient had 2 or more falls or a fall with injury in the past year?Yes   Prior Activity Level Community (5-7x/wk): Pt. was active in community PTA   Prior Functional Level Prior Function Level of Independence: Independent Comments: after removal of trial SCS, pt reports 2 major falls in last month, bruising to R knee after fall. LOC unsure about hitting head. independent with increased time when home alone for ADLs and IADLs. works from home on [place]: Did the patient need help bathing, dressing, using the toilet or eating?  Independent   Indoor Mobility: Did the patient need assistance with walking from room to room (with or without device)? Independent   Stairs: Did the patient need assistance with internal or external stairs (with or without device)? Needed some help   Functional Cognition: Did the patient need help planning regular tasks such as shopping or remembering to take medications? Independent   Home Assistive Devices / Equipment Home Assistive Devices/Equipment: None Home Equipment: Walker - 4 wheels,Cane - quad,Grab bars - toilet,Shower seat,Grab bars - tub/shower,Hand held shower head,Bedside commode   Prior Device Use: Indicate devices/aids used by the patient prior to current illness, exacerbation or injury? None of the above   Current Functional Level Cognition   Overall Cognitive Status: Within Functional Limits for tasks assessed Orientation Level: Oriented X4    Extremity Assessment (includes Sensation/Coordination)   Upper Extremity Assessment: Overall WFL for tasks assessed  Lower Extremity Assessment: Defer to PT evaluation,RLE deficits/detail RLE Deficits / Details: reports pain with input to the sole RLE: Unable to fully assess due to pain RLE Sensation:  (pt reports  numbness) RLE Coordination: decreased fine motor     ADLs   Overall ADL's : Needs assistance/impaired Eating/Feeding: Independent Eating/Feeding Details (indicate cue type and reason): reports nausea with all po intake. reports only keeping cracks and graham crackers down Grooming: Wash/dry hands,Set up,Sitting Grooming Details (indicate cue type and reason): pt able to complete ~15 minutes of standing activity at sink with weight shift to L foot and use of RW. pt able to release with bil UE and when reaching up for hair with immediate R LOB and Max (A) to catch. Lower Body Dressing: Total assistance Toilet Transfer: Minimal assistance,Ambulation,BSC,RW Toilet Transfer Details (indicate cue type and reason): pt on BSC upon arrival to room, minA for mobility around EOB to recliner, increased time/effort for mobility tasks Toileting- Clothing Manipulation and Hygiene: Sit to/from stand,Minimal assistance Toileting - Clothing Manipulation Details (indicate cue type and reason): pt performing anterior and posterior pericare in standing; minA for standing balance, increased time to perform Functional mobility during ADLs: Minimal assistance,Rolling walker General ADL Comments: pt sliding pillow on the floor under R foot     Mobility   Overal bed mobility: Needs Assistance Bed Mobility: Sit to Supine Supine to sit: Min assist Sit to supine: Mod assist,HOB elevated General bed mobility comments: OOB on BSC upon arrival     Transfers   Overall transfer level: Needs assistance Equipment used: Rolling walker (2 wheeled) Transfers: Sit to/from Stand Sit to Stand: Min assist General transfer comment: Min assist  for power up, rise and steady. standing from [facility]; increased time/effort to perform     Ambulation / Gait / Stairs / Wheelchair Mobility   Ambulation/Gait Ambulation/Gait assistance: Herbalist (Feet): 10 Feet (+3) Assistive device: Rolling walker (2 wheeled) Gait  Pattern/deviations: Step-to pattern,Decreased step length - right,Decreased step length - left,Antalgic,Trunk flexed General Gait Details: Min assist to steady, guide RW. Pt requesting use of pillow under RLE to offweight and serve as a "ski" as pt is having difficulty performing hip and knee flexion during swing phase of gait. Pt ambulatory to bathroom, then walked 3 ft to sink before getting near syncopal requiring PT emergently calling RN staff into room to support pt with chair. Pt did not become completely syncopal. Gait velocity: decr Gait velocity interpretation: <1.31 ft/sec, indicative of household ambulator     Posture / Balance Balance Overall balance assessment: Needs assistance Sitting-balance support: Bilateral upper extremity supported,Feet supported Sitting balance-Leahy Scale: Fair Standing balance support: Bilateral upper extremity supported,During functional activity Standing balance-Leahy Scale: Poor Standing balance comment: reliant on at least single UE support and external assist     Special needs/care consideration Skin: Surgical Incision        Previous Home Environment (from acute therapy documentation) Living Arrangements: Parent Available Help at Discharge: Family,Available 24 hours/day Type of Home: House Home Layout: One level Home Access: Stairs to enter Entrance Stairs-Rails: Right,Left Entrance Stairs-Number of Steps: 2 (back deck has been redone to be more steps with shorter rise, maybe 15 stairs) Bathroom Shower/Tub: Optometrist: Yes Home Care Services: No Additional Comments: 27 pound cat name Brewing technologist  (real name [name]) / power chair at home   Discharge Living Setting Plans for Discharge Living Setting: Patient's home Type of Home at Discharge: House Discharge Home Layout: One level Discharge Home Access: Stairs to enter Entrance Stairs-Rails: Right,Left Entrance Stairs-Number of Steps:  2 Discharge Bathroom Shower/Tub: Tub/shower unit Discharge Bathroom Toilet: Standard Discharge Bathroom Accessibility: Yes How Accessible: Accessible via wheelchair,Accessible via walker Does the patient have any problems obtaining your medications?: No   Social/Family/Support Systems Patient Roles: Parent Contact Information: [phone] Anticipated Caregiver: Desma Paganini Anticipated Caregiver's Contact Information: [phone] Ability/Limitations of Caregiver: Pt's father is 66 but in very good health Caregiver Availability: 24/7 Discharge Plan Discussed with Primary Caregiver: Yes Is Caregiver In Agreement with Plan?: Yes Does Caregiver/Family have Issues with Lodging/Transportation while Pt is in Rehab?: No     Goals Patient/Family Goal for Rehab: PT/OT supervision/min A Expected length of stay: 14-17 days Pt/Family Agrees to Admission and willing to participate: Yes Program Orientation Provided & Reviewed with Pt/Caregiver Including Roles  & Responsibilities: Yes     Decrease burden of Care through IP rehab admission: Specialzed equipment needs, Decrease number of caregivers, Bowel and bladder program and Patient/family education     Possible need for SNF placement upon discharge: Not anticipated     Patient Condition: This patient's medical and functional status has changed since the consult dated: 08/14/2020 in which the Rehabilitation Physician determined and documented that the patient's condition is appropriate for intensive rehabilitative care in an inpatient rehabilitation facility. See "History of Present Illness" (above) for medical update. Functional changes are: pt has been evaluated by OT with recommendations for CIR; pt has progressed in transfers from mod A to Min A and progressed in ambulation distance from Mod A for 3 feet with Min A for 10 feet (+3). Patient's medical and functional status update has been discussed  with the Rehabilitation physician and patient  remains appropriate for inpatient rehabilitation. Will admit to inpatient rehab 08/18/20.   Preadmission Screen Completed By:  Clemens Catholic With day of admit updates completed by Raechel Ache, OTR/L 08/18/2020 11:35 AM  ______________________________________________________________________   Discussed status with Dr. Posey Pronto on 08/18/20 at 11:35AM and received approval for admission today.   Admission Coordinator:  Clemens Catholic, With day of admit updates completed by Raechel Ache, OTR/L at time 10:05AM/Date 08/18/20.           Revision History                                                 Routing History              Note Details  Author Posey Pronto, Domenick Bookbinder, MD File Time 08/18/2020 11:38 AM  Author Type Physician Status Addendum  Last Editor Jamse Arn, MD Service Physical Medicine and [place] # [id number] Admit Date 08/11/2020

## 2020-08-18 NOTE — Progress Notes (Signed)
Inpatient Rehabilitation Medication Review by a Pharmacist  A complete drug regimen review was completed for this patient to identify any potential clinically significant medication issues.  Clinically significant medication issues were identified:  yes   Type of Medication Issue Identified Description of Issue Urgent (address now) Non-Urgent (address on AM team rounds) Plan Plan Accepted by Provider? (Yes / No / Pending AM Rounds)  Drug Interaction(s) (clinically significant)       Duplicate Therapy  Miralax daily scheduled + Miralax daily prn ordered Non-urgent Discontinued Miralax prn Sent secure chat  Allergy       No Medication Administration End Date       Incorrect Dose       Additional Drug Therapy Needed       Other  Celebrex and Xiidra not resumed Non-urgent Resume as clinically able     Name of provider notified for urgent issues identified: n/a  Provider Method of Notification:    For non-urgent medication issues to be resolved on team rounds tomorrow morning a [facility] Secure Chat Handoff was sent to:    Pharmacist comments:   Time spent performing this drug regimen review (minutes):  15 min   Renold Genta, PharmD, BCPS 6:07 PM

## 2020-08-18 NOTE — Progress Notes (Signed)
Patient transported off floor via bed to imaging with transport staff in stable condition.

## 2020-08-19 ENCOUNTER — Inpatient Hospital Stay (HOSPITAL_COMMUNITY): Payer: 59

## 2020-08-19 ENCOUNTER — Inpatient Hospital Stay (HOSPITAL_COMMUNITY): Payer: 59 | Admitting: Physical Therapy

## 2020-08-19 ENCOUNTER — Inpatient Hospital Stay (HOSPITAL_COMMUNITY): Payer: 59 | Admitting: Occupational Therapy

## 2020-08-19 ENCOUNTER — Telehealth: Payer: Self-pay | Admitting: Specialist

## 2020-08-19 DIAGNOSIS — G8220 Paraplegia, unspecified: Principal | ICD-10-CM

## 2020-08-19 LAB — COMPREHENSIVE METABOLIC PANEL
ALT: 262 U/L — ABNORMAL HIGH (ref 0–44)
AST: 102 U/L — ABNORMAL HIGH (ref 15–41)
Albumin: 3 g/dL — ABNORMAL LOW (ref 3.5–5.0)
Alkaline Phosphatase: 76 U/L (ref 38–126)
Anion gap: 9 (ref 5–15)
BUN: 28 mg/dL — ABNORMAL HIGH (ref 6–20)
CO2: 21 mmol/L — ABNORMAL LOW (ref 22–32)
Calcium: 9 mg/dL (ref 8.9–10.3)
Chloride: 111 mmol/L (ref 98–111)
Creatinine, Ser: 1.44 mg/dL — ABNORMAL HIGH (ref 0.44–1.00)
GFR, Estimated: 45 mL/min — ABNORMAL LOW (ref >60)
Glucose, Bld: 106 mg/dL — ABNORMAL HIGH (ref 70–99)
Potassium: 4.2 mmol/L (ref 3.5–5.1)
Sodium: 141 mmol/L (ref 135–145)
Total Bilirubin: 1 mg/dL (ref 0.3–1.2)
Total Protein: 5.8 g/dL — ABNORMAL LOW (ref 6.5–8.1)

## 2020-08-19 LAB — CBC WITH DIFFERENTIAL/PLATELET
Abs Immature Granulocytes: 0.17 10*3/uL — ABNORMAL HIGH (ref 0.00–0.07)
Basophils Absolute: 0 10*3/uL (ref 0.0–0.1)
Basophils Relative: 0 %
Eosinophils Absolute: 0 10*3/uL (ref 0.0–0.5)
Eosinophils Relative: 0 %
HCT: 32.8 % — ABNORMAL LOW (ref 36.0–46.0)
Hemoglobin: 10.8 g/dL — ABNORMAL LOW (ref 12.0–15.0)
Immature Granulocytes: 2 %
Lymphocytes Relative: 9 %
Lymphs Abs: 0.8 10*3/uL (ref 0.7–4.0)
MCH: 32.7 pg (ref 26.0–34.0)
MCHC: 32.9 g/dL (ref 30.0–36.0)
MCV: 99.4 fL (ref 80.0–100.0)
Monocytes Absolute: 0.4 10*3/uL (ref 0.1–1.0)
Monocytes Relative: 5 %
Neutro Abs: 7.2 10*3/uL (ref 1.7–7.7)
Neutrophils Relative %: 84 %
Platelets: 164 10*3/uL (ref 150–400)
RBC: 3.3 MIL/uL — ABNORMAL LOW (ref 3.87–5.11)
RDW: 11.9 % (ref 11.5–15.5)
WBC: 8.6 10*3/uL (ref 4.0–10.5)
nRBC: 0 % (ref 0.0–0.2)

## 2020-08-19 MED ORDER — SORBITOL 70 % SOLN
30.0000 mL | Freq: Once | Status: AC
  Administered 2020-08-19: 30 mL via ORAL
  Filled 2020-08-19: qty 30

## 2020-08-19 MED ORDER — ADULT MULTIVITAMIN W/MINERALS CH
1.0000 | ORAL_TABLET | Freq: Two times a day (BID) | ORAL | Status: DC
  Administered 2020-08-19 – 2020-08-31 (×24): 1 via ORAL
  Filled 2020-08-19 (×24): qty 1

## 2020-08-19 MED ORDER — CALCIUM CARBONATE ANTACID 500 MG PO CHEW
1.0000 | CHEWABLE_TABLET | Freq: Three times a day (TID) | ORAL | Status: DC
  Administered 2020-08-19 – 2020-08-31 (×35): 200 mg via ORAL
  Filled 2020-08-19 (×35): qty 1

## 2020-08-19 MED ORDER — BOOST / RESOURCE BREEZE PO LIQD CUSTOM
1.0000 | Freq: Two times a day (BID) | ORAL | Status: DC
  Administered 2020-08-19 – 2020-08-31 (×25): 1 via ORAL

## 2020-08-19 MED ORDER — TOPIRAMATE 25 MG PO TABS
50.0000 mg | ORAL_TABLET | Freq: Two times a day (BID) | ORAL | Status: DC
  Administered 2020-08-19 – 2020-08-20 (×3): 50 mg via ORAL
  Filled 2020-08-19 (×4): qty 2

## 2020-08-19 NOTE — Progress Notes (Signed)
Inpatient Rehabilitation  Patient information reviewed and entered into eRehab system by Loucile Posner M. Baylie Drakes, M.A., CCC/SLP, PPS Coordinator.  Information including medical coding, functional ability and quality indicators will be reviewed and updated through discharge.    

## 2020-08-19 NOTE — Progress Notes (Signed)
Initial Nutrition Assessment  DOCUMENTATION CODES:   Morbid obesity  INTERVENTION:   - Boost Breeze po BID, each supplement provides 250 kcal and 9 grams of protein  - d/c Boost Plus  - MVI with minerals x 2  - Calcium carbonate 500 mg (1 tablet) TID with meals  NUTRITION DIAGNOSIS:   Increased nutrient needs related to post-op healing as evidenced by estimated needs.  GOAL:   Patient will meet greater than or equal to 90% of their needs  MONITOR:   PO intake,Supplement acceptance,Labs,Weight trends,Skin  REASON FOR ASSESSMENT:   Malnutrition Screening Tool    ASSESSMENT:   49 year old female with PMH of HTN, migraines, chronic hypotension, multiple back surgeries with chronic pain. Pt with LLE lumbar radiculopathy and was scheduled to have spinal cord stimulator placed on 08/11/20 but unable to perform surgery due to scar tissue. Post-op on awakening, pt had excruciating pain RLE with plegia and and allodynia.   Thoracic MRI done revealing subtle increased T2/STIR intensity distal cord at T12-L1 level suspicious for edema/acute spinal cord injury, no CSF leak as well as prior PLIF L2-L5 with residual foraminal protrusion L3/5 potentially irritating right L3 nerve. Admitted to CIR on 12/15.   Spoke with pt at bedside. Pt reports appetite is slowly improving. She states that she does not like bananas but received one with her breakfast meal tray today. She reports that someone did come and take her meal orders for her next few meals. RD provided menu orientation for pt and encouraged pt to order from [employer] menu if she does not like the specials.  Pt reports a complete "loss in appetite" since September of this year. Pt reports that she wasn't eating or drinking anything because she "had no taste buds." Pt shares that she has lost 50 lbs in this timeframe. Pt states that she is also now dealing with "kidney problems" and that her labs are not looking good.  Pt shares  that she had a sleeve gastrectomy in 2015 but that the surgeon "botched" the surgery. Pt reports that she did not lost much weight after this initial surgery and has issues with food feeling like it was getting stuck. Pt reports having a "revision" in 2019. Pt states that this was a Roux-en-Y gastric bypass. Reviewed pt's past surgical history and noted laparoscopic gastric sleeve resection with hiatal hernia repair (no date) and biliopancreatic diversion with duodenal switch (no date). RD to order MVI x 2 and calcium carbonate TID with meals.  Reviewed weight history in chart. Pt with a 9.4 kg (20.68 lb) weight loss since 04/15/20. This is a 7.6% weight loss in 4 months which is not quite significant for timeframe but is concerning given progressive nature.  Pt states that she consumed an Ensure yesterday and it caused her to feel bloated and gassy. She would prefer to try a different supplement. RD provided pt with a Boost Breeze supplement at time of visit. Pt tried the supplement and states she would be willing to drink these between meals to aid in meeting kcal and protein needs.  Meal Completion: 50-75%  Medications reviewed and include: IV decadron, Boost Plus TID, reglan 10 mg TID before meals, protonix, miralax  Labs reviewed: BUN 28, creatinine 1.44, elevated LFTs  NUTRITION - FOCUSED PHYSICAL EXAM:  Flowsheet Row Most Recent Value  Orbital Region No depletion  Upper Arm Region No depletion  Thoracic and Lumbar Region No depletion  Buccal Region No depletion  Temple Region No depletion  Clavicle Bone Region No depletion  Clavicle and Acromion Bone Region No depletion  Scapular Bone Region No depletion  Dorsal Hand No depletion  Patellar Region No depletion  Anterior Thigh Region No depletion  Posterior Calf Region No depletion  Edema (RD Assessment) None  Hair Reviewed  Eyes Reviewed  Mouth Reviewed  Skin Reviewed  Nails Reviewed       Diet Order:   Diet Order             Diet regular Room service appropriate? Yes; Fluid consistency: Thin  Diet effective now                 EDUCATION NEEDS:   Education needs have been addressed  Skin:  Skin Assessment: Skin Integrity Issues: Incisions: vertebral column  Last BM:  08/18/20  Height:   Ht Readings from Last 1 Encounters:  08/18/20 5' 1.42" (1.56 m)    Weight:   Wt Readings from Last 1 Encounters:  08/18/20 114.4 kg    BMI:  Body mass index is 47.01 kg/m.  Estimated Nutritional Needs:   Kcal:  1800-2000  Protein:  90-110 grams  Fluid:  >/= 2.0 L    Gustavus Bryant, MS, RD, LDN Inpatient Clinical Dietitian Please see AMiON for contact information.

## 2020-08-19 NOTE — Progress Notes (Signed)
Physical Therapy Session Note  Patient Details  Name: Vanessa Medina MRN: [id number] Date of Birth: April 30, 1971  Today's Date: 08/19/2020 PT Individual Time: 1510-1530 PT Individual Time Calculation (min): 20 min   Short Term Goals: Week 1:  PT Short Term Goal 1 (Week 1): pt to demonstrate ambulation for 100' CGA with LRAD PT Short Term Goal 2 (Week 1): pt to demonstrate functional transfers with LRAD at [facility] PT Short Term Goal 3 (Week 1): pt to demonstrate dynamic standing balance at [facility] for at least 5 mins PT Short Term Goal 4 (Week 1): pt to demonstrate supine<>sit CGA with LRAD  Skilled Therapeutic Interventions/Progress Updates:    pt received in [facility] agreeable to therapy. PT attempted to see pt at scheduled session time and pt being seen with nursing care and unavailable for PT and missed 10 mins of treatment. Pt agreeable to participate once this completed. Pt directed in Sit to stand min A to Rolling walker and gait training for 30' min A for improved step height and advancement of RLE, very poor cadence noted and unable to correct 2/2 pain in RLE and increased need of assistance for coordination of step pattern. Pt then requested to return to bed, min A and sit>supine min A for RLE management. Pt left in bed, alarm set, All needs in reach and in good condition. Call light in hand.  And reported her pain relived once in bed and did not rank.   Therapy Documentation Precautions:  Precautions Precautions: Fall Restrictions Weight Bearing Restrictions: No General: PT Amount of Missed Time (min): 10 Minutes PT Missed Treatment Reason: Nursing care Vital Signs: Therapy Vitals Temp: 98.5 F (36.9 C) Temp Source: Oral Pulse Rate: 64 Resp: 16 BP: 125/69 Patient Position (if appropriate): Sitting Oxygen Therapy SpO2: 100 % O2 Device: Room Air Pain: Pain Assessment Pain Scale: 0-10 Pain Score: 4  Pain Type: Chronic pain;Acute pain Pain Location: Back Pain Orientation:  Lower;Mid Pain Descriptors / Indicators: Aching Pain Frequency: Intermittent Pain Onset: On-going Patients Stated Pain Goal: 3 Pain Intervention(s): Medication (See eMAR) (dilaudid given) Mobility:   Locomotion :    Trunk/Postural Assessment :    Balance: Balance Balance Assessed: Yes Standardized Balance Assessment Standardized Balance Assessment: Timed Up and Go Test Timed Up and Go Test TUG: Normal TUG Normal TUG (seconds): 106 Static Sitting Balance Static Sitting - Balance Support: Feet unsupported Static Sitting - Level of Assistance: 5: Stand by assistance Dynamic Sitting Balance Dynamic Sitting - Balance Support: During functional activity Dynamic Sitting - Level of Assistance: 5: Stand by assistance Static Standing Balance Static Standing - Balance Support: During functional activity Static Standing - Level of Assistance: 4: Min assist Dynamic Standing Balance Dynamic Standing - Balance Support: During functional activity Dynamic Standing - Level of Assistance: 4: Min assist Exercises:   Other Treatments:      Therapy/Group: Individual Therapy  Junie Panning 08/19/2020, 4:01 PM

## 2020-08-19 NOTE — Evaluation (Addendum)
Physical Therapy Assessment and Plan  Patient Details  Name: Vanessa Medina MRN: [id number] Date of Birth: 12/03/1970  PT Diagnosis: Abnormal posture, Abnormality of gait, Difficulty walking, Edema, Impaired sensation and Pain in right LE Rehab Potential: Good ELOS: 2 weeks   Today's Date: 08/19/2020 PT Individual Time: 1000-1100 PT Individual Time Calculation (min): 60 min    Hospital Problem: Principal Problem:   Paraparesis (HCC) Active Problems:   Spinal cord injury, lumbar, without spinal bone injury, sequela (HCC)   Past Medical History:  Past Medical History:  Diagnosis Date  . Anemia   . Cervical cancer (HCC)   . Family history of adverse reaction to anesthesia     " my mother takes a long time time wake up"  . GERD (gastroesophageal reflux disease)   . Headache    migraine  . History of shingles   . Hypertension   . Migraine   . Multiple allergies   . Obesity   . Osteoarthritis    left hip  . PONV (postoperative nausea and vomiting)   . Sleep apnea    does not wear CPAP  . Wears glasses    Past Surgical History:  Past Surgical History:  Procedure Laterality Date  . ABDOMINAL HYSTERECTOMY    . APPENDECTOMY    . BILIOPANCREATIC DIVERSION     with duodenal switch laparoscopic   . CHOLECYSTECTOMY    . DIAGNOSTIC LAPAROSCOPY     LOA  . ESOPHAGOGASTRODUODENOSCOPY (EGD) WITH PROPOFOL N/A 07/25/2017   Procedure: ESOPHAGOGASTRODUODENOSCOPY (EGD) WITH PROPOFOL;  Surgeon: Elliott, Robert T, MD;  Location: [facility] ENDOSCOPY;  Service: Endoscopy;  Laterality: N/A;  . KNEE ARTHROSCOPY    . LAPAROSCOPIC GASTRIC SLEEVE RESECTION WITH HIATAL HERNIA REPAIR    . TONSILLECTOMY    . TOTAL HIP ARTHROPLASTY Left 01/26/2016   Procedure: LEFT TOTAL HIP ARTHROPLASTY ANTERIOR APPROACH;  Surgeon: Naiping M Xu, MD;  Location: [facility] OR;  Service: Orthopedics;  Laterality: Left;  . TRANSFORAMINAL LUMBAR INTERBODY FUSION (TLIF) WITH PEDICLE SCREW FIXATION 3 LEVEL  08/2019   James  Nitka, MD L2-L5    Assessment & Plan Clinical Impression: Patient is a 49 y.o. year old female with history of HTN, migraines, morbid obesity--BMI-41, chronic hypotension, multiple back surgeries with chronic pain with LLE lumbar radiculopathy who was scheduled to have spinal cord stimulator placed by Dr. Eichman on 08/11/2020 but unable to perform surgery due to scar tissue.  History taken from patient and chart review.  Post op on awakening, she had excruciating pain RLE with plegia and and allodynia. She was admitted for work up by Dr. Jones from surgical center on 08/11/2020. Thoracic MRI done revealing subtle increased T2/STIR intensity distal cord at T12-L1 level suspicious for edema/acute spinal cord injury, no CSF leak as well as prior PLIF L2-L5 with residual foraminal protrusion L3/5 potentially irritating right L3 nerve. She was started on IV Dilaudid, gabapentin as well as IV Decadron for management of pain, headaches and severe neuropathy.  She continues to have pain in RLE but is having some motor return, has constant headache, decreased hearing in right> left ear, constipation with nausea as well as epigastric pain and weakness. Therapy ongoing and CIR recommended due to functional decline.  Of note, she reports 50 lbs weigh loss in the past 3-4 months--due to issues with N/V/intake. Has had two falls last month---no recall of incident leading to fall question due to syncope. Was in process of work up for renal disease. She has had issue with   LLE weakness with pain for the past year and being followed by Dr. Newton. Did well with stimulator trial.  Please see preadmission assessment from earlier today as well. Patient transferred to CIR on 08/18/2020 .   Patient currently requires min with mobility secondary to muscle weakness, decreased cardiorespiratoy endurance, decreased coordination and decreased sitting balance, decreased standing balance, decreased postural control and decreased balance  strategies.  Prior to hospitalization, patient was independent  with mobility and lived with Family in a House home.  Home access is 2 front doorStairs to enter.  Patient will benefit from skilled PT intervention to maximize safe functional mobility, minimize fall risk and decrease caregiver burden for planned discharge home with 24 hour assist.  Anticipate patient will benefit from follow up HH at discharge.  PT - End of Session Activity Tolerance: Tolerates 30+ min activity with multiple rests Endurance Deficit: Yes PT Assessment Rehab Potential (ACUTE/IP ONLY): Good PT Barriers to Discharge: Home environment access/layout PT Barriers to Discharge Comments: new medical decline, steps to enter home PT Patient demonstrates impairments in the following area(s): Balance;Pain;Safety;Endurance;Sensory;Motor PT Transfers Functional Problem(s): Bed Mobility;Bed to Chair;Car PT Locomotion Functional Problem(s): Ambulation;Wheelchair Mobility;Stairs PT Plan PT Intensity: Minimum of 1-2 x/day ,45 to 90 minutes PT Frequency: 5 out of 7 days PT Duration Estimated Length of Stay: 7-10 days PT Treatment/Interventions: Ambulation/gait training;Balance/vestibular training;Community reintegration;Cognitive remediation/compensation;Discharge planning;Disease management/prevention;Functional mobility training;Neuromuscular re-education;DME/adaptive equipment instruction;Pain management;Splinting/orthotics;Psychosocial support;UE/LE Strength taining/ROM;Therapeutic Activities;Patient/family education;Skin care/wound management;Stair training;Therapeutic Exercise;UE/LE Coordination activities;Wheelchair propulsion/positioning;Visual/perceptual remediation/compensation PT Recommendation Recommendations for Other Services: Therapeutic Recreation consult Therapeutic Recreation Interventions: Stress management Patient destination: Home Equipment Recommended: To be determined   PT  Evaluation Precautions/Restrictions Precautions Precautions: Fall Restrictions Weight Bearing Restrictions: No General   Vital Signs Pain Pain Assessment Pain Scale: 0-10 Pain Score: 0-No pain Faces Pain Scale: No hurt Home Living/Prior Functioning Home Living Available Help at Discharge: Family;Available 24 hours/day Type of Home: House Home Access: Stairs to enter Entrance Stairs-Number of Steps: 2 front door Entrance Stairs-Rails: Right;Left;Can reach both Home Layout: One level Bathroom Shower/Tub: Tub/shower unit Bathroom Toilet: Standard Bathroom Accessibility: Yes Additional Comments: Has shower chair x2 (one over tub and one inside tub); grab bar for toliet and tub needs; BSC; Rollator and RW; lift recliner  Lives With: Family Prior Function Level of Independence: Independent with homemaking with ambulation;Independent with basic ADLs;Independent with gait;Independent with transfers  Able to Take Stairs?: Yes Driving: Yes Vocation Requirements: works remotely Comments: after removal of trial SCS, pt reports 2 major falls in last month, bruising to R knee after fall. LOC unsure about hitting head. independent with increased time when home alone for ADLs and IADLs. works from home on computer Vision/Perception  Perception Perception: Within Functional Limits Praxis Praxis: Intact  Cognition Overall Cognitive Status: Within Functional Limits for tasks assessed Arousal/Alertness: Awake/alert Orientation Level: Oriented X4 Attention: Sustained;Focused Focused Attention: Appears intact Sustained Attention: Appears intact Memory: Appears intact Immediate Memory Recall: Sock;Blue;Bed Memory Recall Sock: Without Cue Memory Recall Blue: Without Cue Memory Recall Bed: Without Cue Awareness: Appears intact Problem Solving: Appears intact Safety/Judgment: Appears intact Sensation Sensation Light Touch: Impaired Detail (pt reports "burning and tingling" throughout RLE,  more so bottom of foot and posterior leg) Light Touch Impaired Details: Impaired RLE Coordination Gross Motor Movements are Fluid and Coordinated: No Fine Motor Movements are Fluid and Coordinated: Yes Coordination and Movement Description: Painful weightbearing through R LE 2/2 nerve pain Motor  Motor Motor: Within Functional Limits   Trunk/Postural Assessment  Cervical Assessment Cervical   Assessment: Within Functional Limits Thoracic Assessment Thoracic Assessment: Within Functional Limits Lumbar Assessment Lumbar Assessment: Within Functional Limits Postural Control Postural Control: Deficits on evaluation  Balance Balance Balance Assessed: Yes Standardized Balance Assessment Standardized Balance Assessment: Timed Up and Go Test Timed Up and Go Test TUG: Normal TUG Normal TUG (seconds): 106 Static Sitting Balance Static Sitting - Balance Support: Feet unsupported Static Sitting - Level of Assistance: 5: Stand by assistance Dynamic Sitting Balance Dynamic Sitting - Balance Support: During functional activity Dynamic Sitting - Level of Assistance: 5: Stand by assistance Static Standing Balance Static Standing - Balance Support: During functional activity Static Standing - Level of Assistance: 4: Min assist Dynamic Standing Balance Dynamic Standing - Balance Support: During functional activity Dynamic Standing - Level of Assistance: 4: Min assist Extremity Assessment  RUE Assessment RUE Assessment: Within Functional Limits LUE Assessment LUE Assessment: Within Functional Limits RLE Assessment RLE Assessment: Exceptions to WFL Passive Range of Motion (PROM) Comments: WFL Active Range of Motion (AROM) Comments: limited 2/2 weakness in hip flexion RLE Strength Right Hip Flexion: 2+/5 Right Hip ABduction: 2+/5 Right Hip ADduction: 2+/5 Right Knee Extension: 3/5 Right Ankle Dorsiflexion: 3-/5 Right Ankle Plantar Flexion: 3-/5 LLE Assessment LLE Assessment:  Exceptions to WFL Passive Range of Motion (PROM) Comments: WFL Active Range of Motion (AROM) Comments: WFL LLE Strength Left Hip Flexion: 4-/5 Left Hip ABduction: 4-/5 Left Hip ADduction: 4-/5 Left Knee Flexion: 4-/5 Left Ankle Dorsiflexion: 4-/5 Left Ankle Plantar Flexion: 4-/5  Care Tool Care Tool Bed Mobility Roll left and right activity   Roll left and right assist level: Contact Guard/Touching assist    Sit to lying activity   Sit to lying assist level: Minimal Assistance - Patient > 75%    Lying to sitting edge of bed activity   Lying to sitting edge of bed assist level: Minimal Assistance - Patient > 75%     Care Tool Transfers Sit to stand transfer   Sit to stand assist level: Minimal Assistance - Patient > 75%    Chair/bed transfer   Chair/bed transfer assist level: Minimal Assistance - Patient > 75%     Toilet transfer   Assist Level: Minimal Assistance - Patient > 75%    Car transfer   Car transfer assist level: Minimal Assistance - Patient > 75%      Care Tool Locomotion Ambulation   Assist level: Minimal Assistance - Patient > 75% Assistive device: Walker-rolling    Walk 10 feet activity   Assist level: Minimal Assistance - Patient > 75% Assistive device: Walker-rolling   Walk 50 feet with 2 turns activity Walk 50 feet with 2 turns activity did not occur: Safety/medical concerns      Walk 150 feet activity Walk 150 feet activity did not occur: Safety/medical concerns      Walk 10 feet on uneven surfaces activity Walk 10 feet on uneven surfaces activity did not occur: Safety/medical concerns      Stairs Stair activity did not occur: Safety/medical concerns        Walk up/down 1 step activity Walk up/down 1 step or curb (drop down) activity did not occur: Safety/medical concerns     Walk up/down 4 steps activity did not occuR: Safety/medical concerns  Walk up/down 4 steps activity      Walk up/down 12 steps activity Walk up/down 12 steps  activity did not occur: Safety/medical concerns      Pick up small objects from floor Pick up small object from the floor (  from standing position) activity did not occur: Safety/medical concerns      Wheelchair Will patient use wheelchair at discharge?: Yes Type of Wheelchair: Manual   Wheelchair assist level: Minimal Assistance - Patient > 75% Max wheelchair distance: 150  Wheel 50 feet with 2 turns activity   Assist Level: Minimal Assistance - Patient > 75%  Wheel 150 feet activity   Assist Level: Minimal Assistance - Patient > 75%    Refer to Care Plan for Long Term Goals  SHORT TERM GOAL WEEK 1 PT Short Term Goal 1 (Week 1): pt to demonstrate ambulation for 100' CGA with LRAD PT Short Term Goal 2 (Week 1): pt to demonstrate functional transfers with LRAD at [facility] PT Short Term Goal 3 (Week 1): pt to demonstrate dynamic standing balance at CGA for at least 5 mins PT Short Term Goal 4 (Week 1): pt to demonstrate supine<>sit CGA with LRAD  Recommendations for other services: Therapeutic Recreation  Stress management  Skilled Therapeutic Intervention  Evaluation completed (see details above and below) with education on PT POC and goals and individual treatment initiated with focus on  Bed mobility, transfer training, gait training, safety awareness, call light use, WC mobility, standing and sitting balance. pt received in recliner agreeable to therapy. Pt directed in Sit to stand from recliner to Rolling walker at min A for stability and VC for hand placement, directed into gait training with Rolling walker for 15' total with very poor cadence, noted increased pain in RLE with associated increased LLE stance time, decreased step length on RLE, trunk flexion and several standing static rest breaks but very motivated to participate in standing balance. Pt directed in WC mobility with BUE propulsion for 150' min A for technique and assist with turning. Pt directed in car transfer with  estimated car height for home use, min A for RLE management into/out of car. Pt then directed in [facility] mobility ascending and descending ramp at mod A for ascending and min A for descending for safety and technique. Pt then directed in [facility] mobility to return to room at 150' CGA for straight path and min A for turns. Pt required assistance for all WC parts manipulation and several VC for break use. Pt requested to return to bed at end of session, mod A for WC setup and Rolling walker min A for stand pivot transfer to bedside with VC for technique to attempt to increase step height on RLE. Pt directed in sit>supine min A for RLE management. Pt left in bed, alarm set, All needs in reach and in good condition. Call light in hand.  And agreeable to all PT recommendations at this time and motivated to participate in PT during stay.   Mobility Bed Mobility Bed Mobility: Supine to Sit;Sit to Supine Rolling Right: Contact Guard/Touching assist Rolling Left: Contact Guard/Touching assist Supine to Sit: Minimal Assistance - Patient > 75% Sit to Supine: Minimal Assistance - Patient > 75% Transfers Transfers: Sit to Stand;Stand to Sit;Stand Pivot Transfers Sit to Stand: Minimal Assistance - Patient > 75% Stand to Sit: Minimal Assistance - Patient > 75% Stand Pivot Transfers: Minimal Assistance - Patient > 75% Stand Pivot Transfer Details: Verbal cues for safe use of DME/AE;Verbal cues for precautions/safety;Verbal cues for sequencing;Verbal cues for gait pattern;Verbal cues for technique Transfer (Assistive device): Rolling walker Locomotion  Gait Ambulation: Yes Gait Assistance: Minimal Assistance - Patient > 75% Gait Distance (Feet): 15 Feet Assistive device: Rolling walker Gait Assistance Details: Verbal cues for safe  use of DME/AE;Verbal cues for precautions/safety;Verbal cues for sequencing;Verbal cues for technique;Verbal cues for gait pattern Gait Gait: Yes Gait Pattern: Impaired Gait Pattern:  Step-to pattern;Decreased stance time - right;Decreased stride length;Decreased step length - right;Trunk flexed;Poor foot clearance - right Gait velocity: decreased Stairs / Additional Locomotion Stairs: No Ramp: Moderate Assistance - Patient 50 - 74% (in [place]) Wheelchair Mobility Wheelchair Mobility: Yes Wheelchair Assistance: Minimal assistance - Patient >75% Wheelchair Propulsion: Both upper extremities Wheelchair Parts Management: Needs assistance;Supervision/cueing Distance: 150   Discharge Criteria: Patient will be discharged from PT if patient refuses treatment 3 consecutive times without medical reason, if treatment goals not met, if there is a change in medical status, if patient makes no progress towards goals or if patient is discharged from hospital.  The above assessment, treatment plan, treatment alternatives and goals were discussed and mutually agreed upon: by patient  Junie Panning 08/19/2020, 12:11 PM

## 2020-08-19 NOTE — Progress Notes (Signed)
Occupational Therapy Session Note  Patient Details  Name: [name] MRN: [id number] Date of Birth: 12/29/1970  Today's Date: 08/19/2020 OT Individual Time: 1300-1345 OT Individual Time Calculation (min): 45 min    Short Term Goals: Week 1:  OT Short Term Goal 1 (Week 1): LTG=STG 2/2 ELOS  Skilled Therapeutic Interventions/Progress Updates:    Pt resting in w/c upon arrival. OT intervention with focus on sit<>stand, TTB transfers, discharge planning, and safety awareness to increase independence with BADLs. Pt already has all needed equipment-BSC and TTB. Grab bars have been installed in bathroom. Sit<>stand from w/c with CGA. Pt practiced TTB transfersX1 with min A for lifting RLE. Pt states she was seeing "spots" when standing X 2 but resolved within 30 seconds. Pt states she had never seen spots before. BP sitting-132/76, standing-118/68. Pt returned to room and remained in w/c with all needs within reach.  Therapy Documentation Precautions:  Precautions Precautions: Fall Restrictions Weight Bearing Restrictions: No Pain: Pt states she had some pain meds at lunch and was "feeling ok" now  Therapy/Group: Individual Therapy  Leroy Libman 08/19/2020, 2:29 PM

## 2020-08-19 NOTE — Telephone Encounter (Signed)
Received L/T disability forms/ forwarding to [place] today

## 2020-08-19 NOTE — Care Management (Signed)
[facility] Individual Statement of Services  Patient Name:  Vanessa Medina  Date:  08/19/2020  Welcome to the [facility].  Our goal is to provide you with an individualized program based on your diagnosis and situation, designed to meet your specific needs.  With this comprehensive rehabilitation program, you will be expected to participate in at least 3 hours of rehabilitation therapies Monday-Friday, with modified therapy programming on the weekends.  Your rehabilitation program will include the following services:  Physical Therapy (PT), Occupational Therapy (OT), 24 hour per day rehabilitation nursing, Therapeutic Recreaction (TR), Psychology, Neuropsychology, Care Coordinator, Rehabilitation Medicine, Nutrition Services, Pharmacy Services and Other  Weekly team conferences will be held on Tuesdays to discuss your progress.  Your Inpatient Rehabilitation Care Coordinator will talk with you frequently to get your input and to update you on team discussions.  Team conferences with you and your family in attendance may also be held.  Expected length of stay: 7-14 days  Overall anticipated outcome: Supervision  Depending on your progress and recovery, your program may change. Your Inpatient Rehabilitation Care Coordinator will coordinate services and will keep you informed of any changes. Your Inpatient Rehabilitation Care Coordinator's name and contact numbers are listed  below.  The following services may also be recommended but are not provided by the [facility] will be made to provide these services after discharge if needed.  Arrangements include referral to agencies that provide these services.  Your insurance has been verified to be:  [facility]  Your primary doctor is:  [place] Pink  Pertinent information will be shared with your doctor and your insurance company.  Inpatient Rehabilitation Care Coordinator:  Cathleen Corti [phone] or ([phone]  Information discussed with and copy given to patient by: Rana Snare, 08/19/2020, 2:01 PM

## 2020-08-19 NOTE — Plan of Care (Signed)
  Problem: RH Balance Goal: LTG: Patient will maintain dynamic sitting balance (OT) Description: LTG:  Patient will maintain dynamic sitting balance with assistance during activities of daily living (OT) Flowsheets (Taken 08/19/2020 1224) LTG: Pt will maintain dynamic sitting balance during ADLs with: Independent Goal: LTG Patient will maintain dynamic standing with ADLs (OT) Description: LTG:  Patient will maintain dynamic standing balance with assist during activities of daily living (OT)  Flowsheets (Taken 08/19/2020 1224) LTG: Pt will maintain dynamic standing balance during ADLs with: Independent with assistive device   Problem: Sit to Stand Goal: LTG:  Patient will perform sit to stand in prep for activites of daily living with assistance level (OT) Description: LTG:  Patient will perform sit to stand in prep for activites of daily living with assistance level (OT) Flowsheets (Taken 08/19/2020 1224) LTG: PT will perform sit to stand in prep for activites of daily living with assistance level: Independent with assistive device   Problem: RH Grooming Goal: LTG Patient will perform grooming w/assist,cues/equip (OT) Description: LTG: Patient will perform grooming with assist, with/without cues using equipment (OT) Flowsheets (Taken 08/19/2020 1224) LTG: Pt will perform grooming with assistance level of: Independent   Problem: RH Bathing Goal: LTG Patient will bathe all body parts with assist levels (OT) Description: LTG: Patient will bathe all body parts with assist levels (OT) Flowsheets (Taken 08/19/2020 1224) LTG: Pt will perform bathing with assistance level/cueing: Supervision/Verbal cueing   Problem: RH Dressing Goal: LTG Patient will perform upper body dressing (OT) Description: LTG Patient will perform upper body dressing with assist, with/without cues (OT). Flowsheets (Taken 08/19/2020 1224) LTG: Pt will perform upper body dressing with assistance level of: Set up  assist Goal: LTG Patient will perform lower body dressing w/assist (OT) Description: LTG: Patient will perform lower body dressing with assist, with/without cues in positioning using equipment (OT) Flowsheets (Taken 08/19/2020 1224) LTG: Pt will perform lower body dressing with assistance level of: Supervision/Verbal cueing   Problem: RH Toileting Goal: LTG Patient will perform toileting task (3/3 steps) with assistance level (OT) Description: LTG: Patient will perform toileting task (3/3 steps) with assistance level (OT)  Flowsheets (Taken 08/19/2020 1224) LTG: Pt will perform toileting task (3/3 steps) with assistance level: Supervision/Verbal cueing   Problem: RH Toilet Transfers Goal: LTG Patient will perform toilet transfers w/assist (OT) Description: LTG: Patient will perform toilet transfers with assist, with/without cues using equipment (OT) Flowsheets (Taken 08/19/2020 1224) LTG: Pt will perform toilet transfers with assistance level of: Supervision/Verbal cueing   Problem: RH Tub/Shower Transfers Goal: LTG Patient will perform tub/shower transfers w/assist (OT) Description: LTG: Patient will perform tub/shower transfers with assist, with/without cues using equipment (OT) Flowsheets (Taken 08/19/2020 1224) LTG: Pt will perform tub/shower stall transfers with assistance level of: Supervision/Verbal cueing

## 2020-08-19 NOTE — Evaluation (Signed)
Occupational Therapy Assessment and Plan  Patient Details  Name: Vanessa Medina MRN: [id number] Date of Birth: Dec 01, 1970  OT Diagnosis: acute pain, muscle weakness (generalized) and pain in thoracic spine Rehab Potential: Rehab Potential (ACUTE ONLY): Good ELOS: 7-10 DAYS   Today's Date: 08/19/2020 OT Individual Time: 0820-0930 OT Individual Time Calculation (min): 70 min     Hospital Problem: Principal Problem:   Paraparesis ([place]) Active Problems:   Spinal cord injury, lumbar, without spinal bone injury, sequela ([place])   Past Medical History:  Past Medical History:  Diagnosis Date  . Anemia   . Cervical cancer ([place])   . Family history of adverse reaction to anesthesia     " my mother takes a long time time wake up"  . GERD (gastroesophageal reflux disease)   . Headache    migraine  . History of shingles   . Hypertension   . Migraine   . Multiple allergies   . Obesity   . Osteoarthritis    left hip  . PONV (postoperative nausea and vomiting)   . Sleep apnea    does not wear CPAP  . Wears glasses    Past Surgical History:  Past Surgical History:  Procedure Laterality Date  . ABDOMINAL HYSTERECTOMY    . APPENDECTOMY    . BILIOPANCREATIC DIVERSION     with duodenal switch laparoscopic   . CHOLECYSTECTOMY    . DIAGNOSTIC LAPAROSCOPY     LOA  . ESOPHAGOGASTRODUODENOSCOPY (EGD) WITH PROPOFOL N/A 07/25/2017   Procedure: ESOPHAGOGASTRODUODENOSCOPY (EGD) WITH PROPOFOL;  Surgeon: Manya Silvas, MD;  Location: [facility] ENDOSCOPY;  Service: Endoscopy;  Laterality: N/A;  . KNEE ARTHROSCOPY    . LAPAROSCOPIC GASTRIC SLEEVE RESECTION WITH HIATAL HERNIA REPAIR    . TONSILLECTOMY    . TOTAL HIP ARTHROPLASTY Left 01/26/2016   Procedure: LEFT TOTAL HIP ARTHROPLASTY ANTERIOR APPROACH;  Surgeon: Leandrew Koyanagi, MD;  Location: [place];  Service: Orthopedics;  Laterality: Left;  . TRANSFORAMINAL LUMBAR INTERBODY FUSION (TLIF) WITH PEDICLE SCREW FIXATION 3 LEVEL  08/2019   Basil Dess, MD L2-L5    Assessment & Plan Clinical Impression: Patient is a 49 y.o. year old female presenting after acute onset weakness and pain in her RLE in pacu at surgical center for attempted spinal cord stimulator placement. PMH includes: multiple spine surgeries and multi-level fusion, with most recent operation one year ago.  Anemia, GERD, HTN, obesity, and chronic back pain. Patient transferred to CIR on 08/18/2020 .    Patient currently requires min/mod with basic self-care skills secondary to muscle weakness and decreased standing balance and decreased balance strategies.  Prior to hospitalization, patient could complete BADL with modified independent .  Patient will benefit from skilled intervention to increase independence with basic self-care skills prior to discharge home with care partner.  Anticipate patient will require intermittent supervision and follow up home health.  OT - End of Session Endurance Deficit: Yes OT Assessment Rehab Potential (ACUTE ONLY): Good OT Patient demonstrates impairments in the following area(s): Balance;Edema;Endurance;Motor;Pain;Sensory;Safety OT Basic ADL's Functional Problem(s): Grooming;Bathing;Dressing;Toileting OT Transfers Functional Problem(s): Toilet;Tub/Shower OT Additional Impairment(s): None OT Plan OT Intensity: Minimum of 1-2 x/day, 45 to 90 minutes OT Frequency: 5 out of 7 days OT Duration/Estimated Length of Stay: 7-10 DAYS OT Treatment/Interventions: Balance/vestibular training;Community reintegration;Discharge planning;Disease mangement/prevention;DME/adaptive equipment instruction;Functional electrical stimulation;Functional mobility training;Neuromuscular re-education;Pain management;Patient/family education;Psychosocial support;Self Care/advanced ADL retraining;Skin care/wound managment;Therapeutic Activities;Therapeutic Exercise;Splinting/orthotics;UE/LE Strength taining/ROM;UE/LE Coordination activities;Wheelchair  propulsion/positioning OT Basic Self-Care Anticipated Outcome(s): Mod I/supervision OT Toileting Anticipated Outcome(s):  supervision OT Bathroom Transfers Anticipated Outcome(s): supervision OT Recommendation Patient destination: Home Follow Up Recommendations: Home health OT Equipment Recommended: To be determined   OT Evaluation Precautions/Restrictions  Precautions Precautions: Fall Restrictions Weight Bearing Restrictions: No Pain  7/10 pain in back, rest and repositioned for comfort Home Living/Prior [place] expects to be discharged to:: Private residence Living Arrangements: Parent Available Help at Discharge: Family,Available 24 hours/day Type of Home: House Home Access: Stairs to enter [employer] of Steps: 2 (back deck has been redone to be more steps with shorter rise, maybe 15 stairs) Entrance Stairs-Rails: Right,Left Home Layout: One level Bathroom Shower/Tub: Chiropodist: Standard Bathroom Accessibility: Yes Additional Comments: 27 pound cat name Butterbean  (real name Vanessa Medina) / power chair at home, tub transfer bench, 3-in-1 BSC, and RW  Lives With: Family (father) IADL History Type of Occupation: Works in Press [employer] Prior Function Level of Independence: Independent with homemaking with ambulation,Independent with basic ADLs Comments: after removal of trial SCS, pt reports 2 major falls in last month, bruising to R knee after fall. LOC unsure about hitting head. independent with increased time when home alone for ADLs and IADLs. 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, maintenance (IT) Patient Visual Report: No change from baseline Cognition Overall Cognitive Status: Within Functional Limits for tasks assessed Arousal/Alertness: Awake/alert Orientation Level: Person;Place;Situation Person: Oriented Place: Oriented Situation: Oriented Year: 2021 Month: December Day of Week: Correct Memory: Appears intact Immediate  Memory Recall: Sock;Blue;Bed Memory Recall Sock: Without Cue Memory Recall Blue: Without Cue Memory Recall Bed: Without Cue Problem Solving: Appears intact Safety/Judgment: Appears intact Sensation Sensation Light Touch: Impaired Detail Light Touch Impaired Details: Impaired RLE Coordination Gross Motor Movements are Fluid and Coordinated: No Fine Motor Movements are Fluid and Coordinated: Yes Coordination and Movement Description: Painful weightbearing through R LE 2/2 nerve pain Balance Static Sitting Balance Static Sitting - Balance Support: Feet unsupported Static Sitting - Level of Assistance: 5: Stand by assistance Dynamic Sitting Balance Dynamic Sitting - Balance Support: During functional activity Dynamic Sitting - Level of Assistance: 5: Stand by assistance Static Standing Balance Static Standing - Balance Support: During functional activity Static Standing - Level of Assistance: 4: Min assist Dynamic Standing Balance Dynamic Standing - Balance Support: During functional activity Dynamic Standing - Level of Assistance: 4: Min assist Extremity/Trunk Assessment RUE Assessment RUE Assessment: Within Functional Limits LUE Assessment LUE Assessment: Within Functional Limits  Care Tool Care Tool Self Care Eating   Eating Assist Level: Independent    Oral Care    Oral Care Assist Level: Set up assist    Bathing   Body parts bathed by patient: Left arm;Chest;Abdomen;Front perineal area;Right arm;Buttocks;Right upper leg;Left upper leg;Face Body parts bathed by helper: Left lower leg;Right lower leg   Assist Level: Moderate Assistance - Patient 50 - 74%    Upper Body Dressing(including orthotics)   What is the patient wearing?: Pull over shirt   Assist Level: Supervision/Verbal cueing    Lower Body Dressing (excluding footwear)   What is the patient wearing?: Pants Assist for lower body dressing: Maximal Assistance - Patient 25 - 49%    Putting on/Taking off  footwear   What is the patient wearing?: Non-skid slipper socks Assist for footwear: Total Assistance - Patient < 25%       Care Tool Toileting Toileting activity   Assist for toileting: Minimal Assistance - Patient > 75%     Care Tool Bed Mobility Roll left and right activity   Roll left  and right assist level: Independent    Sit to lying activity   Sit to lying assist level: Minimal Assistance - Patient > 75%    Lying to sitting edge of bed activity   Lying to sitting edge of bed assist level: Minimal Assistance - Patient > 75%     Care Tool Transfers Sit to stand transfer   Sit to stand assist level: Minimal Assistance - Patient > 75%    Chair/bed transfer   Chair/bed transfer assist level: Minimal Assistance - Patient > 75%     Toilet transfer   Assist Level: Minimal Assistance - Patient > 75%     Care Tool Cognition Expression of Ideas and Wants Expression of Ideas and Wants: Without difficulty (complex and basic) - expresses complex messages without difficulty and with speech that is clear and easy to understand   Understanding Verbal and Non-Verbal Content Understanding Verbal and Non-Verbal Content: Understands (complex and basic) - clear comprehension without cues or repetitions   Memory/Recall Ability *first 3 days only Memory/Recall Ability *first 3 days only: Location of own room;Staff names and faces;That he or she is in a hospital/hospital unit    Refer to Care Plan for [place] 1 OT Short Term Goal 1 (Week 1): LTG=STG 2/2 ELOS  Recommendations for other services: None    Skilled Therapeutic Intervention Pt greeted semi-reclined in bed and agreeable to OT eval and treat. Reviewed log roll technique for bed mobility with pt able to complete with min A. Stand-pivot to wc with RW, increaesed time and min A as pt tried to keep weight off of R LE 2/2 shooting nerve pain. Stand-pivot in and out of shower in similar fashion. Bathing  completed with mod A to wash LEs. Dressing from wc with set-up for UB and max A for LB without AE. Pt will benefit on use of AE for LB ADLs. Pt then completed stand-pivot to recliner with RW and min A. Pt left in care of nursing for medication, chair alarm on, call bell in reach, and needs met.   ADL ADL Eating: Independent Grooming: Setup Upper Body Bathing: Setup Lower Body Bathing: Maximal assistance Upper Body Dressing: Setup Lower Body Dressing: Maximal assistance Toileting: Minimal assistance Walk-In Shower Transfer: Minimal assistance Mobility  Transfers Sit to Stand: Minimal Assistance - Patient > 75% Stand to Sit: Minimal Assistance - Patient > 75%   Discharge Criteria: Patient will be discharged from OT if patient refuses treatment 3 consecutive times without medical reason, if treatment goals not met, if there is a change in medical status, if patient makes no progress towards goals or if patient is discharged from hospital.  The above assessment, treatment plan, treatment alternatives and goals were discussed and mutually agreed upon: by patient  Valma Cava 08/19/2020, 10:02 AM

## 2020-08-19 NOTE — Progress Notes (Signed)
Vanessa Medina PHYSICAL MEDICINE & REHABILITATION PROGRESS NOTE   Subjective/Complaints:  Pt reports she's allergic to almost every opiate gets hives and blisters and severe itching all over her body.   Can't put weight on RLE_ using pillow and scooting it across floor when using RW.   Poor sleep- hasn't been able to eat much- was surprised was constipated- but then LBM was yesterday before KUB- hard/bloody.   Stopped taking Celebrex at home due to kidney function.    Only takes pain meds at home when is crying due to pain.    ROS:  Pt denies SOB, abd pain, CP, N/V/C/D, and vision changes  Objective:   DG Abd 1 View  Result Date: 08/18/2020 CLINICAL DATA:  Constipation. EXAM: ABDOMEN - 1 VIEW COMPARISON:  None. FINDINGS: The colon is distended with air and stool. There is no small bowel dilatation. No evidence of free air in the supine views. Surgical clips in the right upper quadrant from cholecystectomy. Spinal fusion hardware in place. Left hip arthroplasty. IMPRESSION: Distended colon with air and stool, suggesting constipation. No obstruction or small bowel distension. Electronically Signed   By: Keith Rake M.D.   On: 08/18/2020 19:36   Recent Labs    08/19/20 0440  WBC 8.6  HGB 10.8*  HCT 32.8*  PLT 164   Recent Labs    08/19/20 0440  NA 141  K 4.2  CL 111  CO2 21*  GLUCOSE 106*  BUN 28*  CREATININE 1.44*  CALCIUM 9.0    Intake/Output Summary (Last 24 hours) at 08/19/2020 0924 Last data filed at 08/19/2020 0739 Gross per 24 hour  Intake 360 ml  Output --  Net 360 ml        Physical Exam: Vital Signs Blood pressure 129/66, pulse (!) 43, temperature 98.2 F (36.8 C), resp. rate 17, height 5' 1.42" (1.56 m), weight 114.4 kg, SpO2 98 %.  Constitutional: awake, alert, appropriate, sitting up in bed- ate breakfast, NAD HENT: conjugate gaze Cardiovascular: RRR  Pulmonary: CTA B/L- no W/R/R- good air movement Abdominal: Soft, NT, ND, (+)BS  hypoactive Musculoskeletal:     Cervical back: Normal range of motion and neck supple.     Comments: Lower extremity edema  Skin:    General: Skin is warm and dry.     Comments: Incision with dressing CDI  Neurological:     Comments: Mild left facial weakness with ptosis and halting speech at times.--baseline per son.  Motor: RUE: 4 -/5 proximal distally in LUE:/5 proximal RLE: Blood, extension 3 5/5, ankle dorsiflexion 2-5/5 LLE: Hip flexion, knee extension, dorsiflexion 4 -/5  Psychiatric:    appropriate   Assessment/Plan: 1. Functional deficits which require 3+ hours per day of interdisciplinary therapy in a comprehensive inpatient rehab setting.  Physiatrist is providing close team supervision and 24 hour management of active medical problems listed below.  Physiatrist and rehab team continue to assess barriers to discharge/monitor patient progress toward functional and medical goals  Care Tool:  Bathing              Bathing assist       Upper Body Dressing/Undressing Upper body dressing   What is the patient wearing?: Hospital gown only    Upper body assist Assist Level: Set up assist    Lower Body Dressing/Undressing Lower body dressing            Lower body assist       Toileting Toileting    Toileting assist Assist  for toileting: Minimal Assistance - Patient > 75%     Transfers Chair/bed transfer  Transfers assist     Chair/bed transfer assist level: 2 Helpers     Locomotion Ambulation   Ambulation assist              Walk 10 feet activity   Assist           Walk 50 feet activity   Assist           Walk 150 feet activity   Assist           Walk 10 feet on uneven surface  activity   Assist           Wheelchair     Assist               Wheelchair 50 feet with 2 turns activity    Assist            Wheelchair 150 feet activity     Assist          Blood pressure  129/66, pulse (!) 43, temperature 98.2 F (36.8 C), resp. rate 17, height 5' 1.42" (1.56 m), weight 114.4 kg, SpO2 98 %.  Medical Problem List and Plan: 1.  Deficits with mobility, transfers, endurance, self-care secondary to paraparesis.              -patient may shower with incision dressed             -ELOS/Goals: 10-14 days/supervision/mod I  12/16- pt wants ot get home by birthday if possible on 12/26- will see if possible              Admit to CIR 2.  Antithrombotics: -DVT/anticoagulation:  Pharmaceutical: Lovenox             -antiplatelet therapy: N/A 3. Pain Management: On IV decadron, robaxin and tylenol prn.              --continue gabapentin 400 mg tid for neuropathy  12/16- has Dilaudid 4 mg q4 hours prn for severe pain- if that's not appropriate, or has allergic reaciton, will try Nucynta- she's had good Sx's relief from that in past.  4. Mood: LCSW to follow for evaluation and support.              -antipsychotic agents: N/A 5. Neuropsych: This patient is capable of making decisions on her own behalf. 6. Skin/Wound Care: Routine pressure relief measures.  7. Fluids/Electrolytes/Nutrition: Monitor I/Os. Discontinue IVF and encourage fluid intake.              CMP ordered for tomorrow 8. Migraines: Takes Topamax at bedtime. Now constant due to spinal cord injury. Supportive care recommended.  12/16- will try increasing Topamax to 50 mg BID to help with migraine prophylaxis.  9. Constipation: Likely obstipation as has not had BM since admission.             KUB ordered to determine stool burden.               Continue Reglan order enema and augment bowel regimen.   12/16- really constipated- will try Sorbitol today after therapy- if doesn't work, might require Mg citrate.  10. Bradycardia: Question due to spinal injury--HR has in 40's but been as low as 30's.              ECG ordered 11. Chronic hypotension:  On midodrine tid with florinef per Dr. Rockey Situ.  Orthostatic vital signs ordered             Monitor with increased exertion 12. Morbid obesity s/p gastric sleeve: Encourage/educate on weight loss and dietary changes to help promote overall health and mobility.  13. Acute on chronic renal failure: Ultrasound 08/2020 showing medical renal disease. BUN/SCr-26/1.7 on 11/30. Had normal renal function a year ago. No labs since 12/08--BUN/SCr-44/3.40. Has been on IVF for hydration.              CMP ordered for tomorrow a.m.                      Will also order PVRs to rule out neurogenic bladder as cause.   12/16- Cr down to 1.44- not sure what her baseline is- (1 year ago was 1.05 or so). Will keep off celebrex.    LOS: 1 days A FACE TO FACE EVALUATION WAS PERFORMED  Dedee Liss 08/19/2020, 9:24 AM

## 2020-08-20 ENCOUNTER — Inpatient Hospital Stay (HOSPITAL_COMMUNITY): Payer: 59

## 2020-08-20 ENCOUNTER — Inpatient Hospital Stay (HOSPITAL_COMMUNITY): Payer: 59 | Admitting: Physical Therapy

## 2020-08-20 MED ORDER — FLUDROCORTISONE ACETATE 0.1 MG PO TABS
0.1000 mg | ORAL_TABLET | Freq: Every day | ORAL | Status: DC
  Administered 2020-08-21 – 2020-08-25 (×5): 0.1 mg via ORAL
  Filled 2020-08-20 (×5): qty 1

## 2020-08-20 MED ORDER — DEXAMETHASONE 4 MG PO TABS
4.0000 mg | ORAL_TABLET | Freq: Every day | ORAL | Status: AC
  Administered 2020-08-22 – 2020-08-23 (×2): 4 mg via ORAL
  Filled 2020-08-20 (×2): qty 1

## 2020-08-20 MED ORDER — MIDODRINE HCL 5 MG PO TABS
10.0000 mg | ORAL_TABLET | Freq: Three times a day (TID) | ORAL | Status: DC
  Administered 2020-08-20 – 2020-08-31 (×33): 10 mg via ORAL
  Filled 2020-08-20 (×33): qty 2

## 2020-08-20 MED ORDER — METHOCARBAMOL 500 MG PO TABS
500.0000 mg | ORAL_TABLET | Freq: Four times a day (QID) | ORAL | Status: DC | PRN
  Administered 2020-08-21: 500 mg via ORAL
  Filled 2020-08-20 (×3): qty 1

## 2020-08-20 MED ORDER — POLYETHYLENE GLYCOL 3350 17 G PO PACK: 17.0000 g | PACK | Freq: Every day | ORAL | Status: DC | PRN

## 2020-08-20 MED ORDER — DEXAMETHASONE 4 MG PO TABS
4.0000 mg | ORAL_TABLET | Freq: Two times a day (BID) | ORAL | Status: AC
  Administered 2020-08-20 – 2020-08-21 (×4): 4 mg via ORAL
  Filled 2020-08-20 (×5): qty 1

## 2020-08-20 NOTE — Progress Notes (Signed)
Occupational Therapy Session Note  Patient Details  Name: Vanessa Medina MRN: [id number] Date of Birth: [birth year]  Today's Date: 08/20/2020 OT Individual Time: 1000-1040 OT Individual Time Calculation (min): 40 min  and Today's Date: 08/20/2020 OT Missed Time: 20 Minutes Missed Time Reason: Other (comment) (hyptotensive episode)   Short Term Goals: Week 1:  OT Short Term Goal 1 (Week 1): LTG=STG 2/2 ELOS  Skilled Therapeutic Interventions/Progress Updates:    OT intervention with focus on bathing/dressing with sit<>stand from EOB. RN report that pt had syncopal episode during earlier PT session. Explained to pt that until her BP is better managed, a shower is not safe. Pt agreeable. Pt used reacher appropriately to assist with LB dressing. LB bathing with min A sit<>stand. When standing to pull pants over hips, pt experienced another syncopal episode and sat back onto EOB with assistance. Pt did not loose consciousness. Pt describes episode as follows: everything was coming in and dizziness, in addition to her L side collapsing. Pt returned to supine and remained in bed with all needs within reach. Bed alarm activated. RN notified of event.   Therapy Documentation Precautions:  Precautions Precautions: Fall Restrictions Weight Bearing Restrictions: No General: General OT Amount of Missed Time: 20 Minutes Pain:  Pt states her pain is "tolerable"   Therapy/Group: Individual Therapy  Leroy Libman 08/20/2020, 11:02 AM

## 2020-08-20 NOTE — IPOC Note (Signed)
Overall Plan of Care [facility]) Patient Details Name: Vanessa Medina MRN: [id number] DOB: 06-04-1971  Admitting Diagnosis: Paraparesis [facility])  Hospital Problems: Principal Problem:   Paraparesis ([place]) Active Problems:   Spinal cord injury, lumbar, without spinal bone injury, sequela ([place])     Functional Problem List: Nursing Endurance,Medication Management,Nutrition,Pain,Safety,Skin Integrity  PT Balance,Pain,Safety,Endurance,Sensory,Motor  OT Balance,Edema,Endurance,Motor,Pain,Sensory,Safety  SLP    TR         Basic ADL's: OT Grooming,Bathing,Dressing,Toileting     Advanced  ADL's: OT       Transfers: PT Bed Mobility,Bed to Chair,Car  OT Toilet,Tub/Shower     Locomotion: PT Ambulation,Wheelchair Mobility,Stairs     Additional Impairments: OT None  SLP        TR      Anticipated Outcomes Item Anticipated Outcome  Self Feeding    Swallowing      Basic self-care  Mod I/supervision  Toileting  supervision   Bathroom Transfers supervision  Bowel/Bladder  Supervision  Transfers     Locomotion     Communication     Cognition     Pain  <4  Safety/Judgment  Supervision to min assist   Therapy Plan: PT Intensity: Minimum of 1-2 x/day ,45 to 90 minutes PT Frequency: 5 out of 7 days PT Duration Estimated Length of Stay: 7-10 days OT Intensity: Minimum of 1-2 x/day, 45 to 90 minutes OT Frequency: 5 out of 7 days OT Duration/Estimated Length of Stay: 7-10 DAYS     Due to the current state of emergency, patients may not be receiving their 3-hours of Medicare-mandated therapy.   Team Interventions: Nursing Interventions Patient/Family Education,Pain Management,Medication Management,Skin Care/Wound Management,Discharge Planning,Psychosocial Support  PT interventions Ambulation/gait training,Balance/vestibular training,Community reintegration,Cognitive remediation/compensation,Discharge planning,Disease management/prevention,Functional mobility  training,Neuromuscular re-education,DME/adaptive equipment instruction,Pain management,Splinting/orthotics,Psychosocial support,UE/LE Strength taining/ROM,Therapeutic Activities,Patient/family education,Skin care/wound management,Stair training,Therapeutic Exercise,UE/LE Museum/gallery conservator propulsion/positioning,Visual/perceptual remediation/compensation  OT Interventions Balance/vestibular training,Community reintegration,Discharge planning,Disease Public affairs consultant stimulation,Functional mobility training,Neuromuscular re-education,Pain management,Patient/family education,Psychosocial support,Self Care/advanced ADL retraining,Skin care/wound managment,Therapeutic Activities,Therapeutic Exercise,Splinting/orthotics,UE/LE Strength taining/ROM,UE/LE Museum/gallery conservator propulsion/positioning  SLP Interventions    TR Interventions    SW/CM Interventions Discharge Planning,Psychosocial Support,Patient/Family Education   Barriers to Discharge MD  Medical stability  Nursing Decreased caregiver support,Home environment access/layout,Wound Care,Lack of/limited family support,Weight bearing restrictions,Nutrition means    PT Home environment access/layout new medical decline, steps to enter home  OT      SLP      SW       Team Discharge Planning: Destination: PT-Home ,OT- Home , SLP-  Projected Follow-up: PT- , OT-  Home health OT, SLP-  Projected Equipment Needs: PT-To be determined, OT- To be determined, SLP-  Equipment Details: PT- , OT-  Patient/family involved in discharge planning: PT- Patient,  OT-Patient, SLP-   MD ELOS: 10-14 days Medical Rehab Prognosis:  Excellent Assessment: Vanessa Medina is a 49 year old woman who is admitted to CIR with deficits with mobility, transfers, endurance, self-care secondary to paraparesis. Her course has been complicated by orthostatic hypotension, managed with  midodrine, fludricortisone, abdominal binder, and ted hose. She is maintained on Lovenox for DVT prophylaxis. She is on IV decadron, Robaxin, Tylenol, Gabapentin, and Dilaudid for pain management.   See Team Conference Notes for weekly updates to the plan of care

## 2020-08-20 NOTE — Progress Notes (Signed)
Physical Therapy Session Note  Patient Details  Name: Vanessa Medina MRN: [id number] Date of Birth: April 06, 1971  Today's Date: 08/20/2020 PT Individual Time: 6644-0347 PT Individual Time Calculation (min): 68 min   Short Term Goals: Week 1:  PT Short Term Goal 1 (Week 1): pt to demonstrate ambulation for 100' CGA with LRAD PT Short Term Goal 2 (Week 1): pt to demonstrate functional transfers with LRAD at [facility] PT Short Term Goal 3 (Week 1): pt to demonstrate dynamic standing balance at [facility] for at least 5 mins PT Short Term Goal 4 (Week 1): pt to demonstrate supine<>sit CGA with LRAD  Skilled Therapeutic Interventions/Progress Updates:    pt received in sitting edge of bed and agreeable to therapy. Nursing present assisting pt to [facility] for urination. PT present for transfer with nursing at min A Rolling walker to complete. Nurse left pt with PT on BSC. Pt able to void (+) bladder and setup for hygiene with Rolling walker for support. Pt had abdominal binder in place when PT received pt. Pt directed in Sit to stand and step transfer to bedside with Rolling walker min A. Pt directed in Sit to stand from bedside to Rolling walker min A- CGA and denied all symptoms and agreeable to attempt lateral stepping along bedside 8' with Rolling walker x2 and then required sitting rest break. Then on second rep, pt directed in Sit to stand from bed and stood for ~45s and reported increased dizziness and required to sit down. Pt took prolonged rest break then agreeable to lateral stepping along bedside 8'x2 min A with Rolling walker and then required seated rest break. Pt reported she felt better with the abdomina binder on but continued to have intermittent brief dizziness with initial standing, though not consistent. Post rest break, third rep of gait completed with Rolling walker 8'x3 without any symptoms. Fourth rep pt unable to complete 2/2 dizziness, though no "seeing spots" or vision changes only mild dizziness  per pt. Pt returned to sitting EOB, and requested to lay down. Pt directed in sit>supine very light min A for RLE management. Pt had questions about ordering meals and given menu with number to assist in ordering. Pt agreeable. Pt left in bed, alarm set, All needs in reach and in good condition. Call light in hand.    Therapy Documentation Precautions:  Precautions Precautions: Fall Restrictions Weight Bearing Restrictions: No   Therapy/Group: Individual Therapy  Junie Panning 08/20/2020, 2:33 PM

## 2020-08-20 NOTE — Progress Notes (Signed)
Orthopedic Tech Progress Note Patient Details:  Vanessa Medina [birth year] [id number]  Ortho Devices Type of Ortho Device: Abdominal binder Ortho Device/Splint Location: stomach Ortho Device/Splint Interventions: Ordered,Application   Post Interventions Patient Tolerated: Well Instructions Provided: Care of device,Adjustment of device,Poper ambulation with device   Quamere Mussell 08/20/2020, 11:56 AM

## 2020-08-20 NOTE — Progress Notes (Signed)
Patient Details  Name: Vanessa Medina MRN: [id number] Date of Birth: 1970-10-23  Today's Date: 08/20/2020  Hospital Problems: Principal Problem:   Paraparesis [facility]) Active Problems:   Spinal cord injury, lumbar, without spinal bone injury, sequela [facility])  Past Medical History:  Past Medical History:  Diagnosis Date  . Anemia   . Cervical cancer ([place])   . Family history of adverse reaction to anesthesia     " my mother takes a long time time wake up"  . GERD (gastroesophageal reflux disease)   . Headache    migraine  . History of shingles   . Hypertension   . Migraine   . Multiple allergies   . Obesity   . Osteoarthritis    left hip  . PONV (postoperative nausea and vomiting)   . Sleep apnea    does not wear CPAP  . Wears glasses    Past Surgical History:  Past Surgical History:  Procedure Laterality Date  . ABDOMINAL HYSTERECTOMY    . APPENDECTOMY    . BILIOPANCREATIC DIVERSION     with duodenal switch laparoscopic   . CHOLECYSTECTOMY    . DIAGNOSTIC LAPAROSCOPY     LOA  . ESOPHAGOGASTRODUODENOSCOPY (EGD) WITH PROPOFOL N/A 07/25/2017   Procedure: ESOPHAGOGASTRODUODENOSCOPY (EGD) WITH PROPOFOL;  Surgeon: Manya Silvas, MD;  Location: [facility] ENDOSCOPY;  Service: Endoscopy;  Laterality: N/A;  . KNEE ARTHROSCOPY    . LAPAROSCOPIC GASTRIC SLEEVE RESECTION WITH HIATAL HERNIA REPAIR    . TONSILLECTOMY    . TOTAL HIP ARTHROPLASTY Left 01/26/2016   Procedure: LEFT TOTAL HIP ARTHROPLASTY ANTERIOR APPROACH;  Surgeon: Leandrew Koyanagi, MD;  Location: [place];  Service: Orthopedics;  Laterality: Left;  . TRANSFORAMINAL LUMBAR INTERBODY FUSION (TLIF) WITH PEDICLE SCREW FIXATION 3 LEVEL  08/2019   Basil Dess, MD L2-L5   Social History:  reports that she has never smoked. She has never used smokeless tobacco. She reports current alcohol use. She reports that she does not use drugs.  Family / Support Systems Marital Status: Divorced How Long?: 4 years Patient Roles:  Parent Spouse/Significant Other: Divorced Children: adutl son Darrick Meigs Other Supports: father PRN Anticipated Caregiver: Son Darrick Meigs Ability/Limitations of Caregiver: None reported. Son states he has flexible work schedule and will help mother. Father lives at the home and able to help PRN. Caregiver Availability: Intermittent Family Dynamics: Pt father lives in the home wit hher, however, he is independent.  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 Pt reports she has recently submitted new LTD claim. Legal History/Current Legal Issues: Denies Guardian/Conservator: N/A   Abuse/Neglect Abuse/Neglect Assessment Can Be Completed: Yes Physical Abuse: Denies Verbal Abuse: Denies Sexual Abuse: Denies Exploitation of patient/patient's resources: Denies Self-Neglect: Denies  Emotional Status Pt's affect, behavior and adjustment status: Pt in good spirits at time of visit Recent Psychosocial Issues: Admits situational depression due to current health issues Psychiatric History: Denies Substance Abuse History: Admits to occasional etoh.  Patient / Family Perceptions, Expectations & Goals Pt/Family understanding of illness & functional limitations: Pt and family have general understanding of care needs Premorbid pt/family roles/activities: Independent Anticipated changes in roles/activities/participation: Assistance with ADLs/IADLs Pt/family expectations/goals: Pt goal is to "get better, and be healthy."  [employer]: None  Premorbid Home Care/DME Agencies: None Transportation available at discharge: family Resource referrals recommended:  Neuropsychology  Discharge Planning Living Arrangements: Children,Parent Support Systems: Children,Parent Type of Residence: Private residence Insurance Resources: Multimedia programmer (specify) Sports administrator) Financial Resources: Water quality scientist Screen Referred: No Living Expenses: Medical laboratory scientific officer Management: Patient Does the patient have any problems obtaining your medications?: No Care Coordinator Anticipated Follow Up Needs: HH/OP Expected length of stay: 7-14 days  Clinical Impression SW met with pt and pt son in room to introduce self, explain role, and discuss discharge process. Pt is not a English as a second language teacher. Pt Durable POA is her son Darrick Meigs. States he will bring in copy. Pt DME: HIP kit, RW, rollator, lift recliner, 3in1 BSC, TTB., grab bar that extends from toilet to shower; 2 steps to enter home, and RW able to fit in bathroom and hallways.   Rana Snare 08/20/2020, 3:49 PM

## 2020-08-20 NOTE — Progress Notes (Signed)
[place] PHYSICAL MEDICINE & REHABILITATION PROGRESS NOTE   Subjective/Complaints: Patient complains of orthostasis during therapy- said this has been a problem for her for 4 years. She has undergone cardiac workup and told her heart has good function- she has not been told why she has low BP. Is symptomatic with therapy.   ROS: Pt denies SOB, abd pain, CP, N/V/C/D, and vision changes, pain  Objective:   DG Abd 1 View  Result Date: 08/18/2020 CLINICAL DATA:  Constipation. EXAM: ABDOMEN - 1 VIEW COMPARISON:  None. FINDINGS: The colon is distended with air and stool. There is no small bowel dilatation. No evidence of free air in the supine views. Surgical clips in the right upper quadrant from cholecystectomy. Spinal fusion hardware in place. Left hip arthroplasty. IMPRESSION: Distended colon with air and stool, suggesting constipation. No obstruction or small bowel distension. Electronically Signed   By: Keith Rake M.D.   On: 08/18/2020 19:36   Recent Labs    08/19/20 0440  WBC 8.6  HGB 10.8*  HCT 32.8*  PLT 164   Recent Labs    08/19/20 0440  NA 141  K 4.2  CL 111  CO2 21*  GLUCOSE 106*  BUN 28*  CREATININE 1.44*  CALCIUM 9.0    Intake/Output Summary (Last 24 hours) at 08/20/2020 1345 Last data filed at 08/20/2020 0900 Gross per 24 hour  Intake 360 ml  Output --  Net 360 ml        Physical Exam: Vital Signs Blood pressure (!) 145/76, pulse (!) 51, temperature 97.7 F (36.5 C), resp. rate 18, height 5' 1.42" (1.56 m), weight 114.4 kg, SpO2 100 %. Gen: no distress, normal appearing HEENT: oral mucosa pink and moist, NCAT Cardio: Bradycardic Chest: normal effort, normal rate of breathing Abd: soft, non-distended Ext: no edema Musculoskeletal:     Cervical back: Normal range of motion and neck supple.     Comments: Lower extremity edema  Skin:    General: Skin is warm and dry.     Comments: Incision with dressing CDI  Neurological:     Comments:  Mild left facial weakness with ptosis and halting speech at times.--baseline per son.  Motor: RUE: 4 -/5 proximal distally in LUE:/5 proximal RLE: Blood, extension 3 5/5, ankle dorsiflexion 2-5/5 LLE: Hip flexion, knee extension, dorsiflexion 4 -/5  Psychiatric:    appropriate   Assessment/Plan: 1. Functional deficits which require 3+ hours per day of interdisciplinary therapy in a comprehensive inpatient rehab setting.  Physiatrist is providing close team supervision and 24 hour management of active medical problems listed below.  Physiatrist and rehab team continue to assess barriers to discharge/monitor patient progress toward functional and medical goals  Care Tool:  Bathing    Body parts bathed by patient: Right arm,Left arm,Chest,Abdomen,Front perineal area,Buttocks,Right upper leg,Left upper leg,Face   Body parts bathed by helper: Right lower leg,Left lower leg     Bathing assist Assist Level: Minimal Assistance - Patient > 75%     Upper Body Dressing/Undressing Upper body dressing   What is the patient wearing?: Pull over shirt    Upper body assist Assist Level: Supervision/Verbal cueing    Lower Body Dressing/Undressing Lower body dressing      What is the patient wearing?: Pants     Lower body assist Assist for lower body dressing: Minimal Assistance - Patient > 75%     Toileting Toileting    Toileting assist Assist for toileting: Minimal Assistance - Patient > 75%  Transfers Chair/bed transfer  Transfers assist     Chair/bed transfer assist level: Minimal Assistance - Patient > 75%     Locomotion Ambulation   Ambulation assist      Assist level: Minimal Assistance - Patient > 75% Assistive device: Walker-rolling     Walk 10 feet activity   Assist     Assist level: Minimal Assistance - Patient > 75% Assistive device: Walker-rolling   Walk 50 feet activity   Assist Walk 50 feet with 2 turns activity did not occur:  Safety/medical concerns         Walk 150 feet activity   Assist Walk 150 feet activity did not occur: Safety/medical concerns         Walk 10 feet on uneven surface  activity   Assist Walk 10 feet on uneven surfaces activity did not occur: Safety/medical concerns         Wheelchair     Assist Will patient use wheelchair at discharge?: Yes Type of Wheelchair: Manual    Wheelchair assist level: Minimal Assistance - Patient > 75% Max wheelchair distance: 150    Wheelchair 50 feet with 2 turns activity    Assist        Assist Level: Minimal Assistance - Patient > 75%   Wheelchair 150 feet activity     Assist      Assist Level: Minimal Assistance - Patient > 75%   Blood pressure (!) 145/76, pulse (!) 51, temperature 97.7 F (36.5 C), resp. rate 18, height 5' 1.42" (1.56 m), weight 114.4 kg, SpO2 100 %.  Medical Problem List and Plan: 1.  Deficits with mobility, transfers, endurance, self-care secondary to paraparesis.              -patient may shower with incision dressed             -ELOS/Goals: 10-14 days/supervision/mod I  12/16- pt wants ot get home by birthday if possible on 12/26- will see if possible              Continue CIR 2.  Antithrombotics: -DVT/anticoagulation:  Pharmaceutical: Lovenox             -antiplatelet therapy: N/A 3. Pain Management: On IV decadron, robaxin and tylenol prn.              --continue gabapentin 400 mg tid for neuropathy  12/16- has Dilaudid 4 mg q4 hours prn for severe pain- if that's not appropriate, or has allergic reaciton, will try Nucynta- she's had good Sx's relief from that in past.  4. Mood: LCSW to follow for evaluation and support.              -antipsychotic agents: N/A 5. Neuropsych: This patient is capable of making decisions on her own behalf. 6. Skin/Wound Care: Routine pressure relief measures.  7. Fluids/Electrolytes/Nutrition: Monitor I/Os. Discontinue IVF and encourage fluid intake.               CMP ordered for tomorrow 8. Migraines: Takes Topamax at bedtime. Now constant due to spinal cord injury. Supportive care recommended.  12/16- will try increasing Topamax to 50 mg BID to help with migraine prophylaxis.  9. Constipation: Likely obstipation as has not had BM since admission.             KUB ordered to determine stool burden.               Continue Reglan order enema and augment bowel regimen.   12/16-  really constipated- will try Sorbitol today after therapy- if doesn't work, might require Mg citrate.  10. Bradycardia: Question due to spinal injury--HR has in 40's but been as low as 30's.              12/17: HR improved to 51, continue to monitor TID 11. Chronic hypotension:  On midodrine tid with florinef per Dr. Rockey Situ.               Orthostatic vital signs ordered  Add abdominal binder and ted hose.              Monitor with increased exertion 12. Morbid obesity s/p gastric sleeve: Encourage/educate on weight loss and dietary changes to help promote overall health and mobility.  13. Acute on chronic renal failure: Ultrasound 08/2020 showing medical renal disease. BUN/SCr-26/1.7 on 11/30. Had normal renal function a year ago. No labs since 12/08--BUN/SCr-44/3.40. Has been on IVF for hydration.              CMP ordered for tomorrow a.m.                      Will also order PVRs to rule out neurogenic bladder as cause.   12/16- Cr down to 1.44- not sure what her baseline is- (1 year ago was 1.05 or so). Will keep off celebrex.  12/17: encouraged hydration    LOS: 2 days A FACE TO FACE EVALUATION WAS PERFORMED  Esiquio Boesen P Saoirse Legere 08/20/2020, 1:45 PM

## 2020-08-20 NOTE — Progress Notes (Signed)
Patient seen.  Pain controlled at rest, still has some left occipital/supraorbital headache, though better.  Improved movement in RLE.  Notes syncopal episodes X 2 today - states has h/o similar episodes X 4 yrs.    Wound exam:  Staples in place, c/d/i   A/P 49 yo f w/SCI, improving  1.  Appreciate rehab work/efforts - patient with significant improvement over past 8 days.  2.  Plan to remove staples Monday (08/23/20).  3.  Consider occipital nerve block for headache.

## 2020-08-20 NOTE — Progress Notes (Signed)
Patient had a syncopal episode while working with PT this morning. PT reported that pt "passed out for 5-10 seconds" and noted some shaking while sitting on chair. Vital signs assessed. Pt assisted back to bed. Vitals assessed again. Pt stated feeling better. Pam, PA made aware. New orders obtained. Continue plan of care.     Gerald Stabs, RN

## 2020-08-20 NOTE — Progress Notes (Signed)
Physical Therapy Session Note  Patient Details  Name: Vanessa Medina MRN: [id number] Date of Birth: 02-11-1971  Today's Date: 08/20/2020 PT Individual Time: 0800-0900 PT Individual Time Calculation (min): 60 min   Short Term Goals: Week 1:  PT Short Term Goal 1 (Week 1): pt to demonstrate ambulation for 100' CGA with LRAD PT Short Term Goal 2 (Week 1): pt to demonstrate functional transfers with LRAD at [facility] PT Short Term Goal 3 (Week 1): pt to demonstrate dynamic standing balance at [facility] for at least 5 mins PT Short Term Goal 4 (Week 1): pt to demonstrate supine<>sit CGA with LRAD  Skilled Therapeutic Interventions/Progress Updates:    pt received in recliner and agreeable to therapy. Pt reports she did not sleep well last night and has been awake since 1 am. Pt directed in Sit to stand from recliner to Rolling walker at min A and extra time taken to insure no dizziness however after ~45s in standing pt demonstrated posterior sway and PT instructed pt to return to sitting and elevated BLE. BP taken to be 137/67. Pt agreeable to reattempt, directed in Sit to stand to Rolling walker min A, no dizziness reported or any other symptoms, Stand pivot transfer to WC min A. Pt taken to day room in [facility], total A for time. Pt then directed in NuStep for 10 mins L2, VC to attempt at increased pace as tolerated, fair effect and pt denied pain with this and reported feeling better post activity reporting she "needed to move". NuStep completed to improve BLE strength, coordination in BLE, and overall increased tolerance to activity. Pt directed in Stand pivot transfer to WC, min A, denied symptoms of dizziness or lightheadedness. Pt took seated rest break post activity then agreeable to attempt gait training. Pt directed in Sit to stand to Rolling walker CGA directed to stand statically for 2-3 mins prior to attempting gait, pt did not at CGA with cues for deep breathing and pt reported she felt fine and denied all  dizziness or lightheadedness. Pt directed in gait for 10' required seated rest break 2/2 fatigue and pt very motivated to continue post rest break. Second rep of gait, pt directed in static standing at Rolling walker for safety for ~ 2 mins and denied dizziness or lightheadedness and directed in gait initiation, pt ambulated 4' and stopped participating in conversation, pt did not answer to name, PT directed and lead pt into WC to sit, pt demonstrated possible syncopal episode in [facility], did not answer to name and eyes closed. Nursing brought to pt side in gym and PA as well to assess pt. Pt recovered and began answering all questions and reported feeling "much better" was unresponsive for 5-10seconds in total. Medical team updated on situation and directed staff to return pt to room and supine. PT and nursing completed this with total A in [facility] for safety, Stand pivot transfer with Rolling walker CGA for pt to return to bed and min  A for sit>supine. Nursing present and assessing pt, PT left pt with staff continuing to report she felt better and in good spirits. All needs in reach and in good condition. Call light in hand.    Therapy Documentation Precautions:  Precautions Precautions: Fall Restrictions Weight Bearing Restrictions: No General:   Vital Signs: Therapy Vitals Temp: 97.7 F (36.5 C) Pulse Rate: (!) 51 Resp: 18 BP: (!) 145/76 Patient Position (if appropriate): Lying Oxygen Therapy SpO2: 100 % O2 Device: Room Air Pain:   Mobility:  Locomotion :    Trunk/Postural Assessment :    Balance:   Exercises:   Other Treatments:      Therapy/Group: Individual Therapy  Junie Panning 08/20/2020, 9:24 AM

## 2020-08-21 ENCOUNTER — Inpatient Hospital Stay (HOSPITAL_COMMUNITY): Payer: 59 | Admitting: Occupational Therapy

## 2020-08-21 ENCOUNTER — Inpatient Hospital Stay (HOSPITAL_COMMUNITY): Payer: 59 | Admitting: Physical Therapy

## 2020-08-21 MED ORDER — TOPIRAMATE 25 MG PO TABS
100.0000 mg | ORAL_TABLET | Freq: Every day | ORAL | Status: DC
  Administered 2020-08-21 – 2020-08-30 (×10): 100 mg via ORAL
  Filled 2020-08-21 (×10): qty 4

## 2020-08-21 MED ORDER — GABAPENTIN 400 MG PO CAPS
1200.0000 mg | ORAL_CAPSULE | Freq: Every day | ORAL | Status: DC
Start: 2020-08-21 — End: 2020-08-31
  Administered 2020-08-21 – 2020-08-30 (×10): 1200 mg via ORAL
  Filled 2020-08-21 (×11): qty 3

## 2020-08-21 MED ORDER — GABAPENTIN 400 MG PO CAPS: 1200.0000 mg | ORAL_CAPSULE | Freq: Every day | ORAL | Status: DC

## 2020-08-21 MED ORDER — TOPIRAMATE 25 MG PO TABS: 100.0000 mg | ORAL_TABLET | Freq: Every day | ORAL | Status: DC

## 2020-08-21 NOTE — Progress Notes (Signed)
Occupational Therapy Session Note  Patient Details  Name: Vanessa Medina MRN: [id number] Date of Birth: [birth year]  Today's Date: 08/21/2020 OT Individual Time: 3903-0092 and 3300-7622 OT Individual Time Calculation (min): 63 min and 44 min  Short Term Goals: Week 1:  OT Short Term Goal 1 (Week 1): LTG=STG 2/2 ELOS  Skilled Therapeutic Interventions/Progress Updates:    Pt greeted in the restroom, just transferred to the elevated toilet with nursing staff. Pt already wearing thigh high Teds and abdominal binder, stated she felt good today and requested to shower once she was finished voiding bowels. CGA for perhygiene completion in standing, OT also showed her how to lower the bar of the drop arm BSC to complete hygiene while seated. CGA for stand pivot<w/c<TTB in shower using grab bars for standing support. Once her back incision was covered, pt bathed while seated using lukewarm water and feet propped up for BP safety. Lateral leans for perihygiene. OT assisted her with washing feet as LH sponges were not in supply. When she finished, LEs were ACE wrapped. Initiated Quarry manager while she threaded pants over feet using the reacher given Min A. Total A for footwear and assistance for abdominal binder. She then completed sit<stand using RW to dry buttocks and elevate pants over hips. Note that pt reported she stood up "too fast" and had onset of dizziness, needed to sit for a few moments. Pt able to converse with therapist at this time and then completed another sit<stand with CGA, Mod A to elevate pants and then she completed short distance ambulatory transfer to the w/c parked near doorway with CGA. CGA for stand pivot<recliner. Pt remained in the recliner with all needs within reach, dizziness reportedly absolved, pt stating she felt "like a girl again" after shower, affect bright.   2nd Session 1:1 tx (44 min) Pt greeted in the recliner, just finished using the restroom with NT. 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 Educated her on benefits of taking walking breaks and also using chair yoga at home (she works from home). Guided her through gentle cat/cow poses, lateral bends, and spinal twists with education emphasis placed on coordinating breath with movement. Also educated her on diaphragmatic breathing to promote relaxation during functional activity, pt reports having bouts of "anxiety" during the day. Provided her with lavender aromatherapy to promote relaxation as well. Pt able to use the reacher to doff both socks and don gripper socks using extra wide sock aide given supervision assist. Set up a strap for pt to complete gentle LE exercises in ranges that did not put strain on her back, discussed importance for DVT prevention. OT also issued her with walker and reacher bags and we discussed functional use in regards to item transport at home. Pt appreciative. Left her with all needs within reach.   Therapy Documentation Precautions:  Precautions Precautions: Fall Restrictions Weight Bearing Restrictions: No Vital Signs: Therapy Vitals Temp: 97.9 F (36.6 C) Temp Source: Oral Pulse Rate: 63 Resp: 18 BP: 135/70 Patient Position (if appropriate): Sitting Oxygen Therapy SpO2: 99 % O2 Device: Room Air Pain: pt reported pain to be manageable without additional interventions today   ADL: ADL Eating: Independent Grooming: Setup Upper Body Bathing: Setup Lower Body Bathing: Maximal assistance Upper Body Dressing: Setup Lower Body Dressing: Maximal assistance Toileting: Minimal assistance Walk-In Shower Transfer: Minimal assistance      Therapy/Group: Individual Therapy  Imanii Gosdin A Tomoko Sandra 08/21/2020, 3:21  PM

## 2020-08-21 NOTE — Progress Notes (Signed)
Physical Therapy Session Note  Patient Details  Name: Vanessa Medina MRN: [id number] Date of Birth: April 11, 1971  Today's Date: 08/21/2020 PT Individual Time: 0933-1040 PT Individual Time Calculation (min): 67 min   Short Term Goals: Week 1:  PT Short Term Goal 1 (Week 1): pt to demonstrate ambulation for 100' CGA with LRAD PT Short Term Goal 2 (Week 1): pt to demonstrate functional transfers with LRAD at [facility] PT Short Term Goal 3 (Week 1): pt to demonstrate dynamic standing balance at [facility] for at least 5 mins PT Short Term Goal 4 (Week 1): pt to demonstrate supine<>sit CGA with LRAD  Skilled Therapeutic Interventions/Progress Updates:    session 1: pt received in recliner and agreeable to therapy. Pt had abdominal binder and B LE ace wraps in place at start of session. Pt denied pain at start and end of session. Pt directed in Stand pivot transfer to [facility] with Rolling walker at min A. Taken to ortho gym in [facility] total A for time and energy. Pt directed in x4 dynamic standing activities at [facility]: first rep CGA to come to standing to Rolling walker and forward reaching with sequential clockwise and counter clockwise activity for 2 mins at [place] with one UE support at walker for safety, LOB or reported dizziness noted. Pt instructed to take 3 min break before second activity as this seemed to improve dizziness symptoms in prior session with increased rest break prior to initiating next Sit to stand; second rep pt directed in similar activity with increased number of reaching items, pt reported she felt when she reached over 90 degrees shoulder flexion with either or both UEs she would feel an increase in lightheaded ness or that's when it would start to happen.BITS system lowered so pt would not need to reach higher than 90 degrees shoulder flexion and pt reported no symptoms. Prolonged rest break taken after this activity, total standing time 1 min 56s. Pt reported greatly increased dizziness with stand  and instructed to sit in WC. Pt required extra time and VC for deep breaths and maintaining eyes open, no loss of consciousness noted and pt reported feeling better ~30s after this. Pt required increased time for rest break and agreeable to one additional stand. Pt completed same activity, no dizziness reported and pt said "I feel fine now." pt returned to room in [facility] total A for time, requested to return to recliner, min A for Stand pivot transfer to recliner with Rolling walker. Pt left in recliner, BLE elevated, All needs in reach and in good condition. Call light in hand.  And alarm set.    Therapy Documentation Precautions:  Precautions Precautions: Fall Restrictions Weight Bearing Restrictions: No General:   Vital Signs:   Pain:   Mobility:   Locomotion :    Trunk/Postural Assessment :    Balance:   Exercises:   Other Treatments:      Therapy/Group: Individual Therapy  Junie Panning 08/21/2020, 11:51 AM

## 2020-08-21 NOTE — Progress Notes (Signed)
Patient awake mostly throughout the night in spite taking sleep medicine. Patient denies pain.

## 2020-08-21 NOTE — Progress Notes (Signed)
[place] PHYSICAL MEDICINE & REHABILITATION PROGRESS NOTE   Subjective/Complaints:  Vanessa Medina was started this AM- initially thought could increase, but just started- has been on midodrine 10 mg for 4 years- doesn't know why has low BP- said passed out yesterday in therapy.   Slept all afternoon due to meds BID-TID- will change per pt request Gabapentin and topamax to night only.  Meds during day make her too sleepy, then cannot sleep at night.   Wants shower- advised them to go slow and monitor closely.    ROS:  Pt denies SOB, abd pain, CP, N/V/C/D, and vision changes   Objective:   No results found. Recent Labs    08/19/20 0440  WBC 8.6  HGB 10.8*  HCT 32.8*  PLT 164   Recent Labs    08/19/20 0440  NA 141  K 4.2  CL 111  CO2 21*  GLUCOSE 106*  BUN 28*  CREATININE 1.44*  CALCIUM 9.0    Intake/Output Summary (Last 24 hours) at 08/21/2020 1049 Last data filed at 08/21/2020 0735 Gross per 24 hour  Intake 592 ml  Output --  Net 592 ml        Physical Exam: Vital Signs Blood pressure 116/71, pulse (!) 57, temperature (!) 97.5 F (36.4 C), temperature source Oral, resp. rate 18, height 5' 1.42" (1.56 m), weight 114.4 kg, SpO2 98 %. Gen: sitting up on toilet; OT there- wearing TEDs and abd binder, NAD HEENT: oral mucosa pink and moist, NCAT Cardio: bradycardic- regular rhythm Chest: CTA B/L- no W/R/R- good air movement Abd: wearing abd binder- not distended Musculoskeletal:     Cervical back: Normal range of motion and neck supple.     Comments: Lower extremity edema  Skin:    General: Skin is warm and dry.     Comments: Incision with dressing CDI  Neurological:     Comments: Mild left facial weakness with ptosis and halting speech at times.--baseline per son.  Motor: RUE: 4 -/5 proximal distally in LUE:/5 proximal RLE: Blood, extension 3 5/5, ankle dorsiflexion 2-5/5 LLE: Hip flexion, knee extension, dorsiflexion 4 -/5  Psychiatric:     appropriate   Assessment/Plan: 1. Functional deficits which require 3+ hours per day of interdisciplinary therapy in a comprehensive inpatient rehab setting.  Physiatrist is providing close team supervision and 24 hour management of active medical problems listed below.  Physiatrist and rehab team continue to assess barriers to discharge/monitor patient progress toward functional and medical goals  Care Tool:  Bathing    Body parts bathed by patient: Right arm,Left arm,Chest,Abdomen,Front perineal area,Buttocks,Right upper leg,Left upper leg,Face   Body parts bathed by helper: Right lower leg,Left lower leg     Bathing assist Assist Level: Minimal Assistance - Patient > 75%     Upper Body Dressing/Undressing Upper body dressing   What is the patient wearing?: Pull over shirt,Orthosis (abdominal binder)    Upper body assist Assist Level: Minimal Assistance - Patient > 75%    Lower Body Dressing/Undressing Lower body dressing      What is the patient wearing?: Pants     Lower body assist Assist for lower body dressing: Minimal Assistance - Patient > 75%     Toileting Toileting    Toileting assist Assist for toileting: Minimal Assistance - Patient > 75%     Transfers Chair/bed transfer  Transfers assist     Chair/bed transfer assist level: Minimal Assistance - Patient > 75%     Locomotion Ambulation  Ambulation assist      Assist level: Minimal Assistance - Patient > 75% Assistive device: Walker-rolling     Walk 10 feet activity   Assist     Assist level: Minimal Assistance - Patient > 75% Assistive device: Walker-rolling   Walk 50 feet activity   Assist Walk 50 feet with 2 turns activity did not occur: Safety/medical concerns         Walk 150 feet activity   Assist Walk 150 feet activity did not occur: Safety/medical concerns         Walk 10 feet on uneven surface  activity   Assist Walk 10 feet on uneven surfaces  activity did not occur: Safety/medical concerns         Wheelchair     Assist Will patient use wheelchair at discharge?: Yes Type of Wheelchair: Manual    Wheelchair assist level: Minimal Assistance - Patient > 75% Max wheelchair distance: 150    Wheelchair 50 feet with 2 turns activity    Assist        Assist Level: Minimal Assistance - Patient > 75%   Wheelchair 150 feet activity     Assist      Assist Level: Minimal Assistance - Patient > 75%   Blood pressure 116/71, pulse (!) 57, temperature (!) 97.5 F (36.4 C), temperature source Oral, resp. rate 18, height 5' 1.42" (1.56 m), weight 114.4 kg, SpO2 98 %.  Medical Problem List and Plan: 1.  Deficits with mobility, transfers, endurance, self-care secondary to paraparesis.              -patient may shower with incision dressed             -ELOS/Goals: 10-14 days/supervision/mod I  12/16- pt wants ot get home by birthday if possible on 12/26- will see if possible  12/18- had orthostasis for 4 years per pt              Continue CIR 2.  Antithrombotics: -DVT/anticoagulation:  Pharmaceutical: Lovenox             -antiplatelet therapy: N/A 3. Pain Management: On IV decadron, robaxin and tylenol prn.              --continue gabapentin 400 mg tid for neuropathy  12/16- has Dilaudid 4 mg q4 hours prn for severe pain- if that's not appropriate, or has allergic reaciton, will try Nucynta- she's had good Sx's relief from that in past.   12/18- will change topamax and gabapentin to all dose at bedtime. Per pt request.  4. Mood: LCSW to follow for evaluation and support.              -antipsychotic agents: N/A 5. Neuropsych: This patient is capable of making decisions on her own behalf. 6. Skin/Wound Care: Routine pressure relief measures.  7. Fluids/Electrolytes/Nutrition: Monitor I/Os. Discontinue IVF and encourage fluid intake.              CMP ordered for tomorrow 8. Migraines: Takes Topamax at bedtime. Now  constant due to spinal cord injury. Supportive care recommended.  12/16- will try increasing Topamax to 50 mg BID to help with migraine prophylaxis.   12/18- change to bedtime per pt request.  9. Constipation: Likely obstipation as has not had BM since admission.             KUB ordered to determine stool burden.               Continue Reglan  order enema and augment bowel regimen.   12/16- really constipated- will try Sorbitol today after therapy- if doesn't work, might require Mg citrate.   12/18- LBM documented 12/16- will con't to monitor- might need more sorbitol tomorrow.  10. Bradycardia: Question due to spinal injury--HR has in 40's but been as low as 30's.              12/17: HR improved to 51, continue to monitor TID 11. Chronic hypotension:  On midodrine tid with Vanessa Medina per Dr. Rockey Situ.               Orthostatic vital signs ordered  Add abdominal binder and ted hose.              Monitor with increased exertion  12/18- just started Vanessa Medina- will do for a few days, then increase.   12. Morbid obesity s/p gastric sleeve: Encourage/educate on weight loss and dietary changes to help promote overall health and mobility.  13. Acute on chronic renal failure: Ultrasound 08/2020 showing medical renal disease. BUN/SCr-26/1.7 on 11/30. Had normal renal function a year ago. No labs since 12/08--BUN/SCr-44/3.40. Has been on IVF for hydration.              CMP ordered for tomorrow a.m.                      Will also order PVRs to rule out neurogenic bladder as cause.   12/16- Cr down to 1.44- not sure what her baseline is- (1 year ago was 1.05 or so). Will keep off celebrex.  12/17: encouraged hydration  12/18- will recheck labs on Monday.     LOS: 3 days A FACE TO FACE EVALUATION WAS PERFORMED  Vanessa Medina 08/21/2020, 10:49 AM

## 2020-08-22 ENCOUNTER — Inpatient Hospital Stay (HOSPITAL_COMMUNITY): Payer: 59 | Admitting: Occupational Therapy

## 2020-08-22 ENCOUNTER — Inpatient Hospital Stay (HOSPITAL_COMMUNITY): Payer: 59

## 2020-08-22 NOTE — Progress Notes (Signed)
Physical Therapy Session Note  Patient Details  Name: Vanessa Medina MRN: [id number] Date of Birth: [birth year]  Today's Date: 08/22/2020 PT Individual Time: 9518-8416 and 6063-0160 PT Individual Time Calculation (min): 55 min and 43 min  Short Term Goals: Week 1:  PT Short Term Goal 1 (Week 1): pt to demonstrate ambulation for 100' CGA with LRAD PT Short Term Goal 2 (Week 1): pt to demonstrate functional transfers with LRAD at [facility] PT Short Term Goal 3 (Week 1): pt to demonstrate dynamic standing balance at [facility] for at least 5 mins PT Short Term Goal 4 (Week 1): pt to demonstrate supine<>sit CGA with LRAD  Skilled Therapeutic Interventions/Progress Updates:   Treatment Session 1: 1093-2355 55 min Received pt sitting in WC, pt agreeable to therapy, and denied any pain during session. Session with emphasis on functional mobility/transfers, generalized strengthening, dynamic standing balance/coordination, and improved activity tolerance. Pt denied any symptoms of dizziness at start of session. Pt doffed dirty shirt and donned clean shirt and deodorant with supervision. Donned abdominal binder sitting in WC with mod A and bilateral ace wraps with total A. Donned tennis shoes with max A. Pt performed WC mobility >328ft using BUE and supervision to dayroom and performed bilateral LE strengthening on Kinetron at 20 cm/sec for 1 minute x 4 trials without back support with emphasis on trunk control, glute, and quad strengthening. Pt transferred sit<>stand without AD and min A in preparation to engage in dynamic sanding balance playing cornhole, however, pt reported dizziness, began shaking, and very quiet; therefore returned to sitting with min A. BP sitting in WC: 143/81. Pt able to recover with increased time, deep breathing, and focusing on one object in anterior visual field. Pt then transferred sit<>stand with min A for trial 2. BP in standing: 110/79 pt initially began shaking again but then reported  the "tightness" of BP cuff helped to decrease symptoms of dizziness and pt able to remain standing to perform 1 round of cornhole with min A for balance (standing ~1 minute). Pt transported back to room in [facility] total A. Concluded session with pt sitting in WC, needs within reach, and chair pad alarm on. Therapist provided pt with fresh ice water.   Treatment Session 2: 1445-1528 43 min Received pt sitting in recliner with RN present administering medications, pt agreeable to therapy, and denied any pain during session but reported fatigue from previous therapies. Session with emphasis on functional mobility/transfers, generalized strengthening, dynamic standing balance/coordinaiton, ambulation, and improved activity tolerance.  Abdominal binder and ace wraps donned throughout session. Pt transferred recliner<>WC stand<>pivot without AD and min A. Pt transported to ortho gym in [facility] total A for time management purposes and performed simulated car transfer with RW and CGA with increased time. Pt ambulated 21ft with RW and CGA and denied any symptoms of dizziness/shakiness. Attempted to ambulate again however upon standing pt became dizzy and shaky and returned to sitting with min A. Pt required increased time to recover and then transferred sit<>stand with RW and CGA and engened in dynamic standing balance tossing horseshoes with RUE x 1 trial with CGA for balance. Upon attempting trial 2, pt with another orthostatic episode and returned to sitting. Pt transported back to room in [facility] total A and requested to return to bed. Stand<>pivot WC<>bed without AD and CGA and sit<>supine with supervision. Doffed abdominal binder with min A. Concluded session with pt supine in bed, needs within reach, and bed alarm on.   Therapy Documentation Precautions:  Precautions  Precautions: Fall Restrictions Weight Bearing Restrictions: No  Therapy/Group: Individual Therapy Alfonse Alpers PT, DPT   08/22/2020,  7:18 AM

## 2020-08-22 NOTE — Progress Notes (Signed)
[place] PHYSICAL MEDICINE & REHABILITATION PROGRESS NOTE   Subjective/Complaints:   Pt very upset/tearful about low BP issues- feeling like Cards hasn't "dx'd her or treated her"- explained there's not an easy treatment for low BP issues. Is now on both treatments- titrating up Florinef in the next few days.   Tearful- admits she's having grief about not going home for birthday, and lost mother/grandparent 4 years ago and thinks now dealing with that-   Therapy going well- Did have BM yesterday- passing a lot of gas.   ROS:  Pt denies SOB, abd pain, CP, N/V/C/D, and vision changes  Objective:   No results found. No results for input(s): WBC, HGB, HCT, PLT in the last 72 hours. No results for input(s): NA, K, CL, CO2, GLUCOSE, BUN, CREATININE, CALCIUM in the last 72 hours.  Intake/Output Summary (Last 24 hours) at 08/22/2020 1050 Last data filed at 08/22/2020 0900 Gross per 24 hour  Intake 460 ml  Output --  Net 460 ml        Physical Exam: Vital Signs Blood pressure (!) 112/54, pulse (!) 58, temperature 98.1 F (36.7 C), temperature source Oral, resp. rate 16, height 5' 1.42" (1.56 m), weight 114.4 kg, SpO2 92 %. Gen: sitting up in w/c- in room, tearful throughout, NAD HEENT: oral mucosa pink and moist, NCAT Cardio: borderline bradycardia- regular rhythm Chest: CTA B/L- no W/R/R- good air movement Abd: wearing abd binder- Soft, NT, ND, (+)BS  Musculoskeletal:     Cervical back: Normal range of motion and neck supple.     Comments: Lower extremity edema  Skin:    General: Skin is warm and dry.     Comments: Incision with dressing CDI  Neurological:     Comments: Mild left facial weakness with ptosis and halting speech at times.--baseline per son.  Motor: RUE: 4 -/5 proximal distally in LUE:/5 proximal RLE: Blood, extension 3 5/5, ankle dorsiflexion 2-5/5 LLE: Hip flexion, knee extension, dorsiflexion 4 -/5  Psychiatric: tearful   Assessment/Plan: 1.  Functional deficits which require 3+ hours per day of interdisciplinary therapy in a comprehensive inpatient rehab setting.  Physiatrist is providing close team supervision and 24 hour management of active medical problems listed below.  Physiatrist and rehab team continue to assess barriers to discharge/monitor patient progress toward functional and medical goals  Care Tool:  Bathing    Body parts bathed by patient: Right arm,Left arm,Chest,Abdomen,Front perineal area,Buttocks,Right upper leg,Left upper leg,Face   Body parts bathed by helper: Right lower leg,Left lower leg     Bathing assist Assist Level: Minimal Assistance - Patient > 75%     Upper Body Dressing/Undressing Upper body dressing   What is the patient wearing?: Pull over shirt,Orthosis (abdominal binder)    Upper body assist Assist Level: Minimal Assistance - Patient > 75%    Lower Body Dressing/Undressing Lower body dressing      What is the patient wearing?: Pants     Lower body assist Assist for lower body dressing: Minimal Assistance - Patient > 75%     Toileting Toileting    Toileting assist Assist for toileting: Minimal Assistance - Patient > 75%     Transfers Chair/bed transfer  Transfers assist     Chair/bed transfer assist level: Minimal Assistance - Patient > 75%     Locomotion Ambulation   Ambulation assist      Assist level: Minimal Assistance - Patient > 75% Assistive device: Walker-rolling     Walk 10 feet activity  Assist     Assist level: Minimal Assistance - Patient > 75% Assistive device: Walker-rolling   Walk 50 feet activity   Assist Walk 50 feet with 2 turns activity did not occur: Safety/medical concerns         Walk 150 feet activity   Assist Walk 150 feet activity did not occur: Safety/medical concerns         Walk 10 feet on uneven surface  activity   Assist Walk 10 feet on uneven surfaces activity did not occur: Safety/medical  concerns         Wheelchair     Assist Will patient use wheelchair at discharge?: Yes Type of Wheelchair: Manual    Wheelchair assist level: Supervision/Verbal cueing Max wheelchair distance: 317ft    Wheelchair 50 feet with 2 turns activity    Assist        Assist Level: Supervision/Verbal cueing   Wheelchair 150 feet activity     Assist      Assist Level: Supervision/Verbal cueing   Blood pressure (!) 112/54, pulse (!) 58, temperature 98.1 F (36.7 C), temperature source Oral, resp. rate 16, height 5' 1.42" (1.56 m), weight 114.4 kg, SpO2 92 %.  Medical Problem List and Plan: 1.  Deficits with mobility, transfers, endurance, self-care secondary to paraparesis.              -patient may shower with incision dressed             -ELOS/Goals: 10-14 days/supervision/mod I  12/16- pt wants ot get home by birthday if possible on 12/26- will see if possible  12/18- had orthostasis for 4 years per pt   12/19- explained likely not going home by birthday if we are going to work on BP issues/weakness- pt voiced understanding             Continue CIR 2.  Antithrombotics: -DVT/anticoagulation:  Pharmaceutical: Lovenox             -antiplatelet therapy: N/A 3. Pain Management: On IV decadron, robaxin and tylenol prn.              --continue gabapentin 400 mg tid for neuropathy  12/16- has Dilaudid 4 mg q4 hours prn for severe pain- if that's not appropriate, or has allergic reaciton, will try Nucynta- she's had good Sx's relief from that in past.   12/18- will change topamax and gabapentin to all dose at bedtime. Per pt request.   12/19- slept much better with meds at night 4. Mood: LCSW to follow for evaluation and support.              -antipsychotic agents: N/A 5. Neuropsych: This patient is capable of making decisions on her own behalf. 6. Skin/Wound Care: Routine pressure relief measures.  7. Fluids/Electrolytes/Nutrition: Monitor I/Os. Discontinue IVF and  encourage fluid intake.              CMP ordered for tomorrow 8. Migraines: Takes Topamax at bedtime. Now constant due to spinal cord injury. Supportive care recommended.  12/16- will try increasing Topamax to 50 mg BID to help with migraine prophylaxis.   12/18- change to bedtime per pt request.  12/19- worked much better- slept well- not sleepy during day as much  9. Constipation: Likely obstipation as has not had BM since admission.             KUB ordered to determine stool burden.  Continue Reglan order enema and augment bowel regimen.   12/16- really constipated- will try Sorbitol today after therapy- if doesn't work, might require Mg citrate.   12/18- LBM documented 12/16- will con't to monitor- might need more sorbitol tomorrow.   12/19- LBM yesterday again- con't sorbitol prn 10. Bradycardia: Question due to spinal injury--HR has in 40's but been as low as 30's.              12/17: HR improved to 51, continue to monitor TID 11. Chronic hypotension:  On midodrine tid with florinef per Dr. Rockey Situ.               Orthostatic vital signs ordered  Add abdominal binder and ted hose.              Monitor with increased exertion  12/18- just started Florinef- will do for a few days, then increase.    12/19- BP sitting 110s/60s- con't regimen 12. Morbid obesity s/p gastric sleeve: Encourage/educate on weight loss and dietary changes to help promote overall health and mobility.  13. Acute on chronic renal failure: Ultrasound 08/2020 showing medical renal disease. BUN/SCr-26/1.7 on 11/30. Had normal renal function a year ago. No labs since 12/08--BUN/SCr-44/3.40. Has been on IVF for hydration.              CMP ordered for tomorrow a.m.                      Will also order PVRs to rule out neurogenic bladder as cause.   12/16- Cr down to 1.44- not sure what her baseline is- (1 year ago was 1.05 or so). Will keep off celebrex.  12/17: encouraged hydration  12/18- will recheck labs  on Monday. 14. Grief/tearfulness  12/19- will get Neuropsychology to see pt.      LOS: 4 days A FACE TO FACE EVALUATION WAS PERFORMED  Meryn Sarracino 08/22/2020, 10:50 AM

## 2020-08-22 NOTE — Progress Notes (Signed)
Occupational Therapy Session Note  Patient Details  Name: Vanessa Medina MRN: [id number] Date of Birth: 1971/08/07  Today's Date: 08/22/2020 OT Individual Time: 8280-0349 and 1345-1425 OT Individual Time Calculation (min): 55 min  And 40 min   Short Term Goals: Week 1:  OT Short Term Goal 1 (Week 1): LTG=STG 2/2 ELOS   Skilled Therapeutic Interventions/Progress Updates:    Pt greeted at time of session sitting up in wheelchair agreeable to OT session, no pain throughout session. Focus of session on UB strength, endurance, and standing balance/tolerance with focus on BP. Self propel w/c room <> gym with Supervision with BUE propulsion, pt already with ace wraps and binder on. BP 132/73 seated no symptoms. Warm up on SCIFIT seated on level 4 for 10 minutes switching directions half way through with a brief rest break. Standing activities at [facility] for 2 rounds of 1 and 2 minute intervals with unilateral support using RUE to reach for objects in various planes. Focus on deep breathing as well prior to standing activity, pt asymptomatic throughout with BP 105/78 and 106/70 respectively. No reports of lightheadedness. Self propel back to room with Supervision and set up with call bell inr each all needs met.   Session 2: Pt greeted at time of session sitting up in wheelchair agreeable to OT session, no pain but did report some low back discomfort with hre wheelchair, requesting higher back and request passed along to PT. Self propel room <> gym with supervision, stand pivot w/c <> recliner CGA with RW and extended time as pt performs pursed lip breathing prior. On mat, performed dynamic sitting/core strengthening and UB strengthening with 2kg green ball for 2x20-30 reps on rebounder. Pt able to also recall back precautions without cues. BUE strengthening with 4# dowel for bicep curls, chest press, and FWD circles all with 15 reps. Sit to stands CGA throughout and one mild case of lightheadness that  improved <1 minute and no longer symptomatic. Stand pivot back to wheelchair CGA and symptoms no longer present. Stand picot w/c > recliner CGA with RW and call bell in reach all needs met.    Therapy Documentation Precautions:  Precautions Precautions: Fall Restrictions Weight Bearing Restrictions: No     Therapy/Group: Individual Therapy  Viona Gilmore 08/22/2020, 10:32 AM

## 2020-08-23 ENCOUNTER — Inpatient Hospital Stay (HOSPITAL_COMMUNITY): Payer: 59 | Admitting: Physical Therapy

## 2020-08-23 ENCOUNTER — Inpatient Hospital Stay (HOSPITAL_COMMUNITY): Payer: 59

## 2020-08-23 LAB — CBC
HCT: 32.1 % — ABNORMAL LOW (ref 36.0–46.0)
Hemoglobin: 10.3 g/dL — ABNORMAL LOW (ref 12.0–15.0)
MCH: 32.4 pg (ref 26.0–34.0)
MCHC: 32.1 g/dL (ref 30.0–36.0)
MCV: 100.9 fL — ABNORMAL HIGH (ref 80.0–100.0)
Platelets: 170 10*3/uL (ref 150–400)
RBC: 3.18 MIL/uL — ABNORMAL LOW (ref 3.87–5.11)
RDW: 12.5 % (ref 11.5–15.5)
WBC: 7.4 10*3/uL (ref 4.0–10.5)
nRBC: 0 % (ref 0.0–0.2)

## 2020-08-23 LAB — BASIC METABOLIC PANEL
Anion gap: 9 (ref 5–15)
BUN: 18 mg/dL (ref 6–20)
CO2: 23 mmol/L (ref 22–32)
Calcium: 9 mg/dL (ref 8.9–10.3)
Chloride: 107 mmol/L (ref 98–111)
Creatinine, Ser: 1.05 mg/dL — ABNORMAL HIGH (ref 0.44–1.00)
GFR, Estimated: >60 mL/min (ref >60)
Glucose, Bld: 93 mg/dL (ref 70–99)
Potassium: 3.4 mmol/L — ABNORMAL LOW (ref 3.5–5.1)
Sodium: 139 mmol/L (ref 135–145)

## 2020-08-23 MED ORDER — POTASSIUM CHLORIDE CRYS ER 20 MEQ PO TBCR
40.0000 meq | EXTENDED_RELEASE_TABLET | Freq: Once | ORAL | Status: AC
  Administered 2020-08-23: 40 meq via ORAL
  Filled 2020-08-23: qty 2

## 2020-08-23 NOTE — Progress Notes (Signed)
Patient sitting upright in chair about to eat lunch.  Over all improving, able to move RLE hip flex/ext, leg ext, foot plantar and dorsiflexion - all much improved over weekend.  Pain under control.  Most pain now in posterior aspect of right leg.  Still with orthostatic hypotensive episodes that were present prior to admission.  Wound exam:  C/d/i; no erythema or discharge.  A/P: Pleasant 49yo female with SCI, improving  1.  Staples removed today and steri-stips applied.  May shower.    2.  Pain under control.  3.  Will follow as needed.

## 2020-08-23 NOTE — Progress Notes (Signed)
Occupational Therapy Session Note  Patient Details  Name: Vanessa Medina MRN: [id number] Date of Birth: 13-Aug-1971  Today's Date: 08/23/2020 OT Individual Time: 6659-9357 OT Individual Time Calculation (min): 75 min    Short Term Goals: Week 1:  OT Short Term Goal 1 (Week 1): LTG=STG 2/2 ELOS  Skilled Therapeutic Interventions/Progress Updates:    Pt resting in bed upon arrival and requesting to take shower. OT intervention with focus on functional transfers, standing balance, and BADL training with sit<>stand from wc. Supine>sit to EOB with supervision. Stand pivot transfers with CGA. Min A for LB bathing without long handle sponge. Pt used reacher to assist with LB dressing.  No c/o dizziness or lightheadedness during session. Pt with controlled and deliberate transitional movements. Pt remained in recliner with all needs within reach. Seat alarm activated.  Therapy Documentation Precautions:  Precautions Precautions: Fall Restrictions Weight Bearing Restrictions: No   Pain: "I'm ok"  Therapy/Group: Individual Therapy  Leroy Libman 08/23/2020, 11:57 AM

## 2020-08-23 NOTE — Progress Notes (Signed)
Physical Therapy Session Note  Patient Details  Name: Vanessa Medina MRN: [id number] Date of Birth: 1971/06/10  Today's Date: 08/23/2020 PT Individual Time: 1115-1200; 1400-1500 PT Individual Time Calculation (min): 45 min and 60 min  Short Term Goals: Week 1:  PT Short Term Goal 1 (Week 1): pt to demonstrate ambulation for 100' CGA with LRAD PT Short Term Goal 2 (Week 1): pt to demonstrate functional transfers with LRAD at [facility] PT Short Term Goal 3 (Week 1): pt to demonstrate dynamic standing balance at [facility] for at least 5 mins PT Short Term Goal 4 (Week 1): pt to demonstrate supine<>sit CGA with LRAD  Skilled Therapeutic Interventions/Progress Updates:    Session 1: Pt received seated in recliner in room, agreeable to PT session. No complaints of pain at rest, has onset of low back pain with mobility that subsides again at rest. Seated BP 115/66 while wearing thigh-high ACE wraps. Assisted pt with donning abdominal binder. Sit to stand with CGA to RW. Ambulation x 80 ft, x 160 ft with RW and CGA. At end of second bout of ambulation pt becomes slightly shaky and "freezes" up, requires min A to sit down safely in chair. Seated BP following episode is 125/78 and symptoms quickly resolve in seated position. Manual w/c propulsion x 100 ft with use of BUE at Supervision level for global endurance training. Pt left seated in recliner in room with needs in reach, chair alarm in place at end of session.  Session 2: Pt received seated in recliner in room, agreeable to PT session. No complaints of pain. Seated BP 129/68 with use of thigh-high ACE wraps. Assisted pt with donning abdominal binder. Stand pivot transfer to w/c with RW and CGA. Manual w/c propulsion 2 x 150 ft with use of BUE at Supervision level. Ascend/descend 2 x 6" stairs with 2 handrails and mod A for balance, step-to gait pattern. Pt exhibits some hesitation with stair navigation due to fear of falling and/or RLE giving out but exhibits  no LE buckling during stair navigation this date. Alt L/R 3" step-ups with BUE support and min A for balance 2 x 10 reps each. Sidesteps L/R with RW and CGA for balance, 2 x 20 ft each direction for hip abd strengthening. Pt left seated in w/c in room with needs in reach, chair alarm in place at end of session. Pt with no syncopal episodes noted this session.  Therapy Documentation Precautions:  Precautions Precautions: Fall Restrictions Weight Bearing Restrictions: No   Therapy/Group: Individual Therapy   Excell Seltzer, PT, DPT  08/23/2020, 12:12 PM

## 2020-08-23 NOTE — Progress Notes (Signed)
[place] PHYSICAL MEDICINE & REHABILITATION PROGRESS NOTE   Subjective/Complaints: No complaints this morning Tired and resting Alert K+ low.   ROS: Pt denies SOB, abd pain, CP, N/V/C/D, and vision changes  Objective:   No results found. Recent Labs    08/23/20 0619  WBC 7.4  HGB 10.3*  HCT 32.1*  PLT 170   Recent Labs    08/23/20 0619  NA 139  K 3.4*  CL 107  CO2 23  GLUCOSE 93  BUN 18  CREATININE 1.05*  CALCIUM 9.0    Intake/Output Summary (Last 24 hours) at 08/23/2020 1020 Last data filed at 08/23/2020 0814 Gross per 24 hour  Intake 417 ml  Output --  Net 417 ml        Physical Exam: Vital Signs Blood pressure 105/63, pulse 65, temperature 98.2 F (36.8 C), resp. rate 17, height 5' 1.42" (1.56 m), weight 114.4 kg, SpO2 97 %. Gen: no distress, normal appearing HEENT: oral mucosa pink and moist, NCAT Cardio: Reg rate Chest: normal effort, normal rate of breathing Abd: soft, non-distended Ext: no edema Musculoskeletal:     Cervical back: Normal range of motion and neck supple.     Comments: Lower extremity edema  Skin:    General: Skin is warm and dry.     Comments: Incision with dressing CDI  Neurological:     Comments: Mild left facial weakness with ptosis and halting speech at times.--baseline per son.  Motor: RUE: 4 -/5 proximal distally in LUE:/5 proximal RLE: Blood, extension 3 5/5, ankle dorsiflexion 2-5/5 LLE: Hip flexion, knee extension, dorsiflexion 4 -/5  Psychiatric: tearful   Assessment/Plan: 1. Functional deficits which require 3+ hours per day of interdisciplinary therapy in a comprehensive inpatient rehab setting.  Physiatrist is providing close team supervision and 24 hour management of active medical problems listed below.  Physiatrist and rehab team continue to assess barriers to discharge/monitor patient progress toward functional and medical goals  Care Tool:  Bathing    Body parts bathed by patient: Right  arm,Left arm,Chest,Abdomen,Front perineal area,Buttocks,Right upper leg,Left upper leg,Face   Body parts bathed by helper: Right lower leg,Left lower leg     Bathing assist Assist Level: Minimal Assistance - Patient > 75%     Upper Body Dressing/Undressing Upper body dressing   What is the patient wearing?: Pull over shirt,Orthosis (abdominal binder)    Upper body assist Assist Level: Minimal Assistance - Patient > 75%    Lower Body Dressing/Undressing Lower body dressing      What is the patient wearing?: Pants     Lower body assist Assist for lower body dressing: Minimal Assistance - Patient > 75%     Toileting Toileting    Toileting assist Assist for toileting: Minimal Assistance - Patient > 75%     Transfers Chair/bed transfer  Transfers assist     Chair/bed transfer assist level: Minimal Assistance - Patient > 75%     Locomotion Ambulation   Ambulation assist      Assist level: Minimal Assistance - Patient > 75% Assistive device: Walker-rolling Max distance: 72ft   Walk 10 feet activity   Assist     Assist level: Minimal Assistance - Patient > 75% Assistive device: Walker-rolling   Walk 50 feet activity   Assist Walk 50 feet with 2 turns activity did not occur: Safety/medical concerns         Walk 150 feet activity   Assist Walk 150 feet activity did not occur: Safety/medical concerns  Walk 10 feet on uneven surface  activity   Assist Walk 10 feet on uneven surfaces activity did not occur: Safety/medical concerns         Wheelchair     Assist Will patient use wheelchair at discharge?: Yes Type of Wheelchair: Manual    Wheelchair assist level: Supervision/Verbal cueing Max wheelchair distance: 332ft    Wheelchair 50 feet with 2 turns activity    Assist        Assist Level: Supervision/Verbal cueing   Wheelchair 150 feet activity     Assist      Assist Level: Supervision/Verbal cueing    Blood pressure 105/63, pulse 65, temperature 98.2 F (36.8 C), resp. rate 17, height 5' 1.42" (1.56 m), weight 114.4 kg, SpO2 97 %.  Medical Problem List and Plan: 1.  Deficits with mobility, transfers, endurance, self-care secondary to paraparesis.              -patient may shower with incision dressed             -ELOS/Goals: 10-14 days/supervision/mod I  12/16- pt wants ot get home by birthday if possible on 12/26- will see if possible  12/18- had orthostasis for 4 years per pt   12/19- explained likely not going home by birthday if we are going to work on BP issues/weakness- pt voiced understanding             Continue CIR 2.  Antithrombotics: -DVT/anticoagulation:  Pharmaceutical: Lovenox             -antiplatelet therapy: N/A 3. Pain Management: On IV decadron, robaxin and tylenol prn.              --continue gabapentin 400 mg tid for neuropathy  12/16- has Dilaudid 4 mg q4 hours prn for severe pain- if that's not appropriate, or has allergic reaciton, will try Nucynta- she's had good Sx's relief from that in past.   12/18- will change topamax and gabapentin to all dose at bedtime. Per pt request.   12/19- slept much better with meds at night 4. Mood: LCSW to follow for evaluation and support.              -antipsychotic agents: N/A 5. Neuropsych: This patient is capable of making decisions on her own behalf. 6. Skin/Wound Care: Routine pressure relief measures.  7. Fluids/Electrolytes/Nutrition: Monitor I/Os. Discontinue IVF and encourage fluid intake.              12/20: K+ is low- replete with 48meq kdur today and repeat level tomorrow.  8. Migraines: Takes Topamax at bedtime. Now constant due to spinal cord injury. Supportive care recommended.  12/16- will try increasing Topamax to 50 mg BID to help with migraine prophylaxis.   12/18- change to bedtime per pt request.  12/19- worked much better- slept well- not sleepy during day as much  9. Constipation: Likely obstipation  as has not had BM since admission.             KUB ordered to determine stool burden.               Continue Reglan order enema and augment bowel regimen.   12/16- really constipated- will try Sorbitol today after therapy- if doesn't work, might require Mg citrate.   12/18- LBM documented 12/16- will con't to monitor- might need more sorbitol tomorrow.   12/19- LBM yesterday again- con't sorbitol prn 10. Bradycardia: Question due to spinal injury--HR has in 40's but been as  low as 30's.              12/17: HR improved to 51, continue to monitor TID 11. Chronic hypotension:  On midodrine tid with florinef per Dr. Rockey Situ.               Orthostatic vital signs ordered  Add abdominal binder and ted hose.              Monitor with increased exertion  12/18- just started Florinef- will do for a few days, then increase.    12/19- BP sitting 110s/60s- con't regimen 12. Morbid obesity s/p gastric sleeve: Encourage/educate on weight loss and dietary changes to help promote overall health and mobility.  13. Acute on chronic renal failure: Ultrasound 08/2020 showing medical renal disease. BUN/SCr-26/1.7 on 11/30. Had normal renal function a year ago. No labs since 12/08--BUN/SCr-44/3.40. Has been on IVF for hydration.              CMP ordered for tomorrow a.m.                      Will also order PVRs to rule out neurogenic bladder as cause.   12/16- Cr down to 1.44- not sure what her baseline is- (1 year ago was 1.05 or so). Will keep off celebrex.  12/17: encouraged hydration  12/18- will recheck labs on Monday.  12/20: Cr much improved to 1.05. Repeat in 1 week.  14. Grief/tearfulness  12/19- will get Neuropsychology to see pt.  15. Anemia: Hgb is 10.3 on 12/20, repeat in 1 week.      LOS: 5 days A FACE TO FACE EVALUATION WAS PERFORMED  Vanessa Medina P Shivan Hodes 08/23/2020, 10:20 AM

## 2020-08-23 NOTE — Progress Notes (Signed)
Occupational Therapy Session Note  Patient Details  Name: SAMHITA KRETSCH MRN: [id number] Date of Birth: Oct 10, 1970  Today's Date: 08/23/2020 OT Individual Time: 1400-1430 OT Individual Time Calculation (min): 30 min    Short Term Goals: Week 1:  OT Short Term Goal 1 (Week 1): LTG=STG 2/2 ELOS  Skilled Therapeutic Interventions/Progress Updates:    Pt resting in recliner upon arrival. OT intervention with focus on sit<>stand, standing balance, toilet transfers, functional amb with RW in room, and activity tolerance to increase independence with BADLs. All tasks with CGA. Pt very deliberate and controlled during sit<>stand and functional amb with RW. No s/s of hypotensive BP. BP not checked during session. Ace wraps applied but not abdominal binder. Pt remained in recliner with all needs within reach and seat alarm activated.   Therapy Documentation Precautions:  Precautions Precautions: Fall Restrictions Weight Bearing Restrictions: No Pain:  Pt c/o HA; RN admin meds during session   Therapy/Group: Individual Therapy  Leroy Libman 08/23/2020, 2:45 PM

## 2020-08-24 ENCOUNTER — Inpatient Hospital Stay (HOSPITAL_COMMUNITY): Payer: 59

## 2020-08-24 ENCOUNTER — Inpatient Hospital Stay (HOSPITAL_COMMUNITY): Payer: 59 | Admitting: Physical Therapy

## 2020-08-24 LAB — BASIC METABOLIC PANEL
Anion gap: 10 (ref 5–15)
BUN: 23 mg/dL — ABNORMAL HIGH (ref 6–20)
CO2: 20 mmol/L — ABNORMAL LOW (ref 22–32)
Calcium: 9.3 mg/dL (ref 8.9–10.3)
Chloride: 109 mmol/L (ref 98–111)
Creatinine, Ser: 0.98 mg/dL (ref 0.44–1.00)
GFR, Estimated: >60 mL/min (ref >60)
Glucose, Bld: 83 mg/dL (ref 70–99)
Potassium: 3.9 mmol/L (ref 3.5–5.1)
Sodium: 139 mmol/L (ref 135–145)

## 2020-08-24 MED ORDER — CYCLOBENZAPRINE HCL 10 MG PO TABS
10.0000 mg | ORAL_TABLET | Freq: Three times a day (TID) | ORAL | Status: DC | PRN
  Filled 2020-08-24: qty 1

## 2020-08-24 NOTE — Patient Care Conference (Incomplete)
Inpatient RehabilitationTeam Conference and Plan of Care Update Date: 08/24/2020   Time: 12:49 AM    Patient Name: Vanessa Medina      Medical Record Number: [id number]  Date of Birth: 1971/08/16 Sex: Female         Room/Bed: 4M02C/4M02C-02 Payor Info: Payor: Theme park manager / Plan: [place] / Product Type: *No Product type* /    Admit Date/Time:  08/18/2020  5:41 PM  Primary Diagnosis:  Paraparesis [facility])  Hospital Problems: Principal Problem:   Paraparesis ([place]) Active Problems:   Spinal cord injury, lumbar, without spinal bone injury, sequela ([place])    Expected Discharge Date:    Team Members Present:       Current Status/Progress Goal Weekly Team Focus  Bowel/Bladder   Continent of B/B, LBM 08/23/20  Maintain continence per regulat pattern  Assess toileting needs QS/PRN   Swallow/Nutrition/ Hydration             ADL's             Mobility   CGA to min A overall with RW, gait up to 160 ft with RW CGA, 6" stairs mod A  Supervision overall  LE NMR, gait, balance, BP management   Communication             Safety/Cognition/ Behavioral Observations            Pain   Patient rates surgical pain- back ranging 6-9/10 on pain scale  < or =3  QS/PRN assessment  of pain with reassessment per system standard of practice/protocol   Skin   Skin intact except for surgical lumbar site/back           Discharge Planning:      Team Discussion: *** Patient on target to meet rehab goals: {IP REHAB YES/NO WITH [id number]:93716}  *See Care Plan and progress notes for long and short-term goals.   Revisions to Treatment Plan:  ***  Teaching Needs: ***  Current Barriers to Discharge: {BARRIERS TO RCVELFYBO:17510}  Possible Resolutions to Barriers: ***     Medical Summary               I attest that I was present, lead the team conference, and concur with the assessment and plan of the team.   Belva Chimes 08/24/2020, 12:49 AM

## 2020-08-24 NOTE — Progress Notes (Signed)
Physical Therapy Session Note  Patient Details  Name: Vanessa Medina MRN: [id number] Date of Birth: 1971-04-22  Today's Date: 08/24/2020 PT Individual Time: 1130-1200; 8270-7867 PT Individual Time Calculation (min): 30 min and 70 min  Short Term Goals: Week 1:  PT Short Term Goal 1 (Week 1): pt to demonstrate ambulation for 100' CGA with LRAD PT Short Term Goal 2 (Week 1): pt to demonstrate functional transfers with LRAD at [facility] PT Short Term Goal 3 (Week 1): pt to demonstrate dynamic standing balance at [facility] for at least 5 mins PT Short Term Goal 4 (Week 1): pt to demonstrate supine<>sit CGA with LRAD  Skilled Therapeutic Interventions/Progress Updates:    Session 1: Pt received seated in w/c in room, agreeable to PT session. Pt reports pain in posterior region of RLE at rest, premedicated prior to start of therapy session. Pt also reports pain in low back with mobility. Seated BP with use of thigh-high ACE wrap: 104/58. Assisted pt with donning abdominal binder due to lower BP this AM. Sit to stand and stand pivot transfer with RW at [facility] level this session. Ambulation 2 x 150 ft with use of RW and CGA for balance, decreased gait speed. Pt with no instances of orthostatic hypotension this session. Pt left seated in recliner in room with needs in reach, chair alarm in place at end of session.  Session 2: Pt received seated in recliner in room, agreeable to PT session. Pt reports ongoing soreness in BLE, declines intervention at this time. Seated BP 124/69 with use of thigh-high ACE wraps, deferred use of abdominal binder this PM. Sit to stand with CGA to RW, stand pivot transfer to w/c with RW and CGA. Manual w/c propulsion x 200 ft with use of BUE at Supervision level. Stand pivot transfer to Nustep with RW and CGA. Nustep level 3 x 10 min with use of BUE transitioning to just LE for last 5 min for global strengthening. Ambulation x 200 ft with use of RW at [facility] level for balance. Ascend/descend  4 x 6" stairs with 2 handrails and mod A for balance, step-to gait pattern, heavy reliance on UE support to navigate stairs. Pt returned to bed at end of session, min A for sit to supine for RLE management. Pt left semi-reclined in bed with needs in reach, bed alarm in place.  Therapy Documentation Precautions:  Precautions Precautions: Fall Restrictions Weight Bearing Restrictions: No   Therapy/Group: Individual Therapy   Excell Seltzer, PT, DPT  08/24/2020, 12:21 PM

## 2020-08-24 NOTE — Progress Notes (Signed)
Occupational Therapy Session Note  Patient Details  Name: Vanessa Medina MRN: [id number] Date of Birth: 01-Jan-1971  Today's Date: 08/24/2020 OT Individual Time: 5217-4715 OT Individual Time Calculation (min): 55 min    Short Term Goals: Week 1:  OT Short Term Goal 1 (Week 1): LTG=STG 2/2 ELOS  Skilled Therapeutic Interventions/Progress Updates:    OT intervention with focus on BADL training, standing balance, activity tolerance, and safety awareness to increase independence with BADLs. Pt elected to bathe at sink this morning. Bathing/dressing with sit<>stand from w/c at sink. See Care Tool for specific assist levels. Pt required CGA overall during session. Pt used reacher to assist with threading BLE into pants. Sit<>stand with supervision and standing balance with CGA. Pt transferred to recliner and remained in recliner with all needs within reach and seat alarm activated.   Therapy Documentation Precautions:  Precautions Precautions: Fall Restrictions Weight Bearing Restrictions: No   Pain:  Pt c/o generalized muscle soreness; RN admin meds prior to therapy   Therapy/Group: Individual Therapy  Leroy Libman 08/24/2020, 10:00 AM

## 2020-08-24 NOTE — Progress Notes (Signed)
[place] PHYSICAL MEDICINE & REHABILITATION PROGRESS NOTE   Subjective/Complaints:  Pt reports still dealing with grief.  Having pain in R posterior leg- walked more yesterday- kept her up after 3am due to pain.   Restless last night due to brain spinning and pain.  Doesn't want to change sleep meds .   ROS:  Pt denies SOB, abd pain, CP, N/V/C/D, and vision changes   Objective:   No results found. Recent Labs    08/23/20 0619  WBC 7.4  HGB 10.3*  HCT 32.1*  PLT 170   Recent Labs    08/23/20 0619 08/24/20 0420  NA 139 139  K 3.4* 3.9  CL 107 109  CO2 23 20*  GLUCOSE 93 83  BUN 18 23*  CREATININE 1.05* 0.98  CALCIUM 9.0 9.3    Intake/Output Summary (Last 24 hours) at 08/24/2020 0901 Last data filed at 08/24/2020 0700 Gross per 24 hour  Intake 720 ml  Output --  Net 720 ml        Physical Exam: Vital Signs Blood pressure 123/71, pulse 64, temperature 97.8 F (36.6 C), temperature source Oral, resp. rate 18, height 5' 1.42" (1.56 m), weight 114.4 kg, SpO2 99 %. Gen: sitting up in bed- appropriate, depressed affect, NAD HEENT: oral mucosa pink and moist, NCAT Cardio: RRR Chest: CTA B/L- no W/R/R- good air movement Abd: Soft, NT, ND, (+)BS  Ext: no edema Musculoskeletal:     Cervical back: Normal range of motion and neck supple.     Comments: Lower extremity edema  Severe TTP middle of posterior thigh- palpable trigger point in muscle.  Skin:    General: Skin is warm and dry.     Comments: Incision with dressing CDI  Neurological:     Comments: Mild left facial weakness with ptosis and halting speech at times.--baseline per son.  Motor: RUE: 4 -/5 proximal distally in LUE:/5 proximal RLE: Blood, extension 3 5/5, ankle dorsiflexion 2-5/5 LLE: Hip flexion, knee extension, dorsiflexion 4 -/5  Psychiatric: tearful   Assessment/Plan: 1. Functional deficits which require 3+ hours per day of interdisciplinary therapy in a comprehensive inpatient  rehab setting.  Physiatrist is providing close team supervision and 24 hour management of active medical problems listed below.  Physiatrist and rehab team continue to assess barriers to discharge/monitor patient progress toward functional and medical goals  Care Tool:  Bathing    Body parts bathed by patient: Right arm,Left arm,Chest,Abdomen,Front perineal area,Buttocks,Right upper leg,Left upper leg,Face   Body parts bathed by helper: Left lower leg     Bathing assist Assist Level: Minimal Assistance - Patient > 75%     Upper Body Dressing/Undressing Upper body dressing   What is the patient wearing?: Pull over shirt    Upper body assist Assist Level: Supervision/Verbal cueing    Lower Body Dressing/Undressing Lower body dressing      What is the patient wearing?: Pants     Lower body assist Assist for lower body dressing: Contact Guard/Touching assist     Toileting Toileting    Toileting assist Assist for toileting: Minimal Assistance - Patient > 75%     Transfers Chair/bed transfer  Transfers assist     Chair/bed transfer assist level: Contact Guard/Touching assist     Locomotion Ambulation   Ambulation assist      Assist level: Contact Guard/Touching assist Assistive device: Walker-rolling Max distance: 160'   Walk 10 feet activity   Assist     Assist level: Contact Guard/Touching assist  Assistive device: Walker-rolling   Walk 50 feet activity   Assist Walk 50 feet with 2 turns activity did not occur: Safety/medical concerns  Assist level: Contact Guard/Touching assist Assistive device: Walker-rolling    Walk 150 feet activity   Assist Walk 150 feet activity did not occur: Safety/medical concerns  Assist level: Contact Guard/Touching assist Assistive device: Walker-rolling    Walk 10 feet on uneven surface  activity   Assist Walk 10 feet on uneven surfaces activity did not occur: Safety/medical concerns          Wheelchair     Assist Will patient use wheelchair at discharge?: Yes Type of Wheelchair: Manual    Wheelchair assist level: Supervision/Verbal cueing Max wheelchair distance: 338ft    Wheelchair 50 feet with 2 turns activity    Assist        Assist Level: Supervision/Verbal cueing   Wheelchair 150 feet activity     Assist      Assist Level: Supervision/Verbal cueing   Blood pressure 123/71, pulse 64, temperature 97.8 F (36.6 C), temperature source Oral, resp. rate 18, height 5' 1.42" (1.56 m), weight 114.4 kg, SpO2 99 %.  Medical Problem List and Plan: 1.  Deficits with mobility, transfers, endurance, self-care secondary to paraparesis.              -patient may shower with incision dressed             -ELOS/Goals: 10-14 days/supervision/mod I  12/16- pt wants ot get home by birthday if possible on 12/26- will see if possible  12/18- had orthostasis for 4 years per pt   12/19- explained likely not going home by birthday if we are going to work on BP issues/weakness- pt voiced understanding             Continue CIR 2.  Antithrombotics: -DVT/anticoagulation:  Pharmaceutical: Lovenox             -antiplatelet therapy: N/A 3. Pain Management: On IV decadron, robaxin and tylenol prn.              --continue gabapentin 400 mg tid for neuropathy  12/16- has Dilaudid 4 mg q4 hours prn for severe pain- if that's not appropriate, or has allergic reaciton, will try Nucynta- she's had good Sx's relief from that in past.   12/18- will change topamax and gabapentin to all dose at bedtime. Per pt request.   12/19- slept much better with meds at night 4. Mood: LCSW to follow for evaluation and support.              -antipsychotic agents: N/A 5. Neuropsych: This patient is capable of making decisions on her own behalf. 6. Skin/Wound Care: Routine pressure relief measures.  7. Fluids/Electrolytes/Nutrition: Monitor I/Os. Discontinue IVF and encourage fluid intake.               12/20: K+ is low- replete with 36meq kdur today and repeat level tomorrow.   12/21- K+ up to 3.9 today 8. Migraines: Takes Topamax at bedtime. Now constant due to spinal cord injury. Supportive care recommended.  12/16- will try increasing Topamax to 50 mg BID to help with migraine prophylaxis.   12/18- change to bedtime per pt request.  12/19- worked much better- slept well- not sleepy during day as much  9. Constipation: Likely obstipation as has not had BM since admission.             KUB ordered to determine stool burden.  Continue Reglan order enema and augment bowel regimen.   12/16- really constipated- will try Sorbitol today after therapy- if doesn't work, might require Mg citrate.   12/18- LBM documented 12/16- will con't to monitor- might need more sorbitol tomorrow.   12/19- LBM yesterday again- con't sorbitol prn 10. Bradycardia: Question due to spinal injury--HR has in 40's but been as low as 30's.              12/17: HR improved to 51, continue to monitor TID 11. Chronic hypotension:  On midodrine tid with florinef per Dr. Rockey Situ.               Orthostatic vital signs ordered  Add abdominal binder and ted hose.              Monitor with increased exertion  12/18- just started Florinef- will do for a few days, then increase.    12/19- BP sitting 110s/60s- con't regimen 12. Morbid obesity s/p gastric sleeve: Encourage/educate on weight loss and dietary changes to help promote overall health and mobility.  13. Acute on chronic renal failure: Ultrasound 08/2020 showing medical renal disease. BUN/SCr-26/1.7 on 11/30. Had normal renal function a year ago. No labs since 12/08--BUN/SCr-44/3.40. Has been on IVF for hydration.              CMP ordered for tomorrow a.m.                      Will also order PVRs to rule out neurogenic bladder as cause.   12/16- Cr down to 1.44- not sure what her baseline is- (1 year ago was 1.05 or so). Will keep off celebrex.  12/17:  encouraged hydration  12/18- will recheck labs on Monday.  12/20: Cr much improved to 1.05. Repeat in 1 week.  12/21- Cr down to 0.98-   14. Grief/tearfulness  12/19- will get Neuropsychology to see pt.  15. Anemia: Hgb is 10.3 on 12/20, repeat in 1 week.    16. R posterior thigh pain  12/21- will change robaxin to flexeril 10 mg tid prn  LOS: 6 days A FACE TO FACE EVALUATION WAS PERFORMED  Dara Camargo 08/24/2020, 9:01 AM

## 2020-08-24 NOTE — Progress Notes (Signed)
Nutrition Follow-up  DOCUMENTATION CODES:   Morbid obesity  INTERVENTION:   - Continue Boost Breeze po BID, each supplement provides 250 kcal and 9 grams of protein  - Continue MVI with minerals x 2  - Continue calcium carbonate 500 mg (1 tablet) TID with meals  NUTRITION DIAGNOSIS:   Increased nutrient needs related to post-op healing as evidenced by estimated needs.  Progressing  GOAL:   Patient will meet greater than or equal to 90% of their needs  Progressing  MONITOR:   PO intake,Supplement acceptance,Labs,Weight trends,Skin  REASON FOR ASSESSMENT:   Malnutrition Screening Tool    ASSESSMENT:   49 year old female with PMH of HTN, migraines, chronic hypotension, multiple back surgeries with chronic pain. Pt with LLE lumbar radiculopathy and was scheduled to have spinal cord stimulator placed on 08/11/20 but unable to perform surgery due to scar tissue. Post-op on awakening, pt had excruciating pain RLE with plegia and and allodynia.   Thoracic MRI done revealing subtle increased T2/STIR intensity distal cord at T12-L1 level suspicious for edema/acute spinal cord injury, no CSF leak as well as prior PLIF L2-L5 with residual foraminal protrusion L3/5 potentially irritating right L3 nerve. Admitted to CIR on 12/15.  Noted target d/c date of 12/28.  Pt with non-pitting edema to BLE.  Spoke with pt at bedside. Pt eating lunch at time of visit. Pt reports appetite is much better and she is eating at least 75% of all of her meals. She reports that this is significantly improved compared to PTA. She is consuming her vitamins and is taking Boost Breeze.  Meal Completion: 85-100%  Medications reviewed and include: tums TID, Boost Breeze BID, reglan 10 mg TID before meals, MVI with minerals BID, protonix, miralax  Labs reviewed.  Diet Order:   Diet Order            Diet regular Room service appropriate? Yes; Fluid consistency: Thin  Diet effective now                  EDUCATION NEEDS:   Education needs have been addressed  Skin:  Skin Assessment: Skin Integrity Issues: Incisions: vertebral column  Last BM:  08/24/20 large type 4  Height:   Ht Readings from Last 1 Encounters:  08/18/20 5' 1.42" (1.56 m)    Weight:   Wt Readings from Last 1 Encounters:  08/18/20 114.4 kg    BMI:  Body mass index is 47.01 kg/m.  Estimated Nutritional Needs:   Kcal:  1800-2000  Protein:  90-110 grams  Fluid:  >/= 2.0 L    Gustavus Bryant, MS, RD, LDN Inpatient Clinical Dietitian Please see AMiON for contact information.

## 2020-08-24 NOTE — Progress Notes (Signed)
Patient ID: Vanessa Medina, female   DOB: 10/12/1970, 48 y.o.   MRN: [id number]  SW met with pt in room to provide updates from team conference, and d/c date 12/28. SW discussed family edu. Reported that she will speak with son about his schedule. SW to follow up.   Auria Chamberlain, MSW, LCSWA Office: [phone] Cell: [phone] Fax: [phone] 

## 2020-08-24 NOTE — Plan of Care (Signed)
  Problem: RH Car Transfers Goal: LTG Patient will perform car transfers with assist (PT) Description: LTG: Patient will perform car transfers with assistance (PT). Flowsheets (Taken 08/24/2020 1716) LTG: Pt will perform car transfers with assist:: (upgrade due to progress) Supervision/Verbal cueing Note: Upgrade due to progress   Problem: RH Ambulation Goal: LTG Patient will ambulate in home environment (PT) Description: LTG: Patient will ambulate in home environment, # of feet with assistance (PT). Flowsheets (Taken 08/24/2020 1716) LTG: Pt will ambulate in home environ  assist needed:: (upgrade due to progress) Supervision/Verbal cueing Note: Upgrade due to progress   Problem: RH Stairs Goal: LTG Patient will ambulate up and down stairs w/assist (PT) Description: LTG: Patient will ambulate up and down # of stairs with assistance (PT) Flowsheets (Taken 08/24/2020 1718) LTG: Pt will ambulate up/down stairs assist needed:: Minimal Assistance - Patient > 75% LTG: Pt will  ambulate up and down number of stairs: 2 stairs with one handrail

## 2020-08-24 NOTE — Patient Care Conference (Signed)
Inpatient RehabilitationTeam Conference and Plan of Care Update Date: 08/24/2020   Time: 11:09 AM Patient Name: Vanessa Medina      Medical Record Number: [id number]  Date of Birth: 1971-05-18 Sex: Female         Room/Bed: 4M02C/4M02C-02 Payor Info: Payor: Theme park manager / Plan: [place] / Product Type: *No Product type* /    Admit Date/Time:  08/18/2020  5:41 PM  Primary Diagnosis:  Paraparesis [facility])  Hospital Problems: Principal Problem:   Paraparesis ([place]) Active Problems:   Spinal cord injury, lumbar, without spinal bone injury, sequela [facility])    Expected Discharge Date: Expected Discharge Date: 08/31/20  Team Members Present: Physician leading conference: Dr. Courtney Heys Care Coodinator Present: Loralee Pacas, LCSWA;Louis Ivery Creig Hines, RN, BSN, [place] Nurse Present: Judee Clara, LPN PT Present: Excell Seltzer, PT OT Present: Willeen Cass, OT;Roanna Epley, COTA PPS Coordinator present : Gunnar Fusi, SLP     Current Status/Progress Goal Weekly Team Focus  Bowel/Bladder   Continent of B/B, LBM 08/23/20  Maintain continence per regulat pattern  Assess toileting needs QS/PRN   Swallow/Nutrition/ Hydration             ADL's   bathing at shower level-min A; LB dressing-min A; functional transfers-CGA  supervision overall  BP management, functional tranfsers, bathing/dressing, safety awareness, activity tolerance   Mobility   CGA to min A overall with RW, gait up to 160 ft with RW CGA, 6" stairs mod A  Supervision overall  LE NMR, gait, balance, BP management   Communication             Safety/Cognition/ Behavioral Observations            Pain   Patient rates surgical pain- back ranging 6-9/10 on pain scale  < or =3  QS/PRN assessment  of pain with reassessment per system standard of practice/protocol   Skin   Skin intact except for surgical lumbar site/back, tenderness,Surgical staples removed lumbar region on 08/2020, steri-strips intact      Assess QS/PRN signs of infection, skin breakdown,     Discharge Planning:  Pt to d/c to home with support from her father (who lives in the home), and her adult son who has flexible work schedule who intends to provide all support pt will need.   Team Discussion: Continent B/B, gave tylenol this AM, dad and son available to help at discharge. PT reports patient is contact guard with RW over 150', mod assist with stairs, ace wrap lower extremities and occasional abdominal binder for BP. OT reports BP of 116/69 but not symptomatic. Min assist overall for ADL's. Patient on target to meet rehab goals: yes  *See Care Plan and progress notes for long and short-term goals.   Revisions to Treatment Plan:  MD added Flexeril and discontinued Robaxin, she has a very tight muscle, requested she be added to neuropsych list.  Teaching Needs: Family education Pain management  Current Barriers to Discharge: Decreased caregiver support, Home enviroment access/layout, Wound care, Weight and Behavior  Possible Resolutions to Barriers: Continue current medications, pain management, wound care, provide emotional support to patient and family.     Medical Summary Current Status: Added Florinef for hypotension; LBM yesterday; changed Robaxin to flexeril; is on Neuropsych list- tomorrow; continent B/B  Barriers to Discharge: Other (comments);Decreased family/caregiver support;Behavior;Home enviroment access/layout;Medical stability;Weight;Wound care  Barriers to Discharge Comments: d/c home with son/father- hypokalemia, hypotension- x 4 years- d/c 12/28 Possible Resolutions to [employer] Focus: walked 140ft min A RW;  better hypotension- per therapy; using Abd binder/ACE wraps/TEDs;  no Sx's during OT today; min A OT;   Continued Need for Acute Rehabilitation Level of Care: The patient requires daily medical management by a physician with specialized training in physical medicine and rehabilitation for  the following reasons: Direction of a multidisciplinary physical rehabilitation program to maximize functional independence : Yes Medical management of patient stability for increased activity during participation in an intensive rehabilitation regime.: Yes Analysis of laboratory values and/or radiology reports with any subsequent need for medication adjustment and/or medical intervention. : Yes   I attest that I was present, lead the team conference, and concur with the assessment and plan of the team.   Cristi Loron 08/24/2020, 5:40 PM

## 2020-08-24 NOTE — Progress Notes (Signed)
Occupational Therapy Session Note  Patient Details  Name: Vanessa Medina MRN: [id number] Date of Birth: Apr 23, 1971  Today's Date: 08/24/2020 OT Individual Time: 1405-1430 OT Individual Time Calculation (min): 25 min    Short Term Goals: Week 1:  OT Short Term Goal 1 (Week 1): LTG=STG 2/2 ELOS  Skilled Therapeutic Interventions/Progress Updates:    Pt resting in recliner upon arrival.  OT intervention with focus on standing balance while using BUE for tasks.  Pt engaged in [place] activities using BUE for 4x2 min tasks. CGA for all tasks with rest break between each task. Pt propelled w/c back to room and amb with RW from doorway to recliner. Pt remained in recliner with all needs within reach and seat alarm activated.   Therapy Documentation Precautions:  Precautions Precautions: Fall Restrictions Weight Bearing Restrictions: No Pain: Pt c/o "numbness" in RLE/foot; relief with standing and amb   Therapy/Group: Individual Therapy  Leroy Libman 08/24/2020, 2:46 PM

## 2020-08-24 NOTE — Plan of Care (Deleted)
  Problem: RH Car Transfers Goal: LTG Patient will perform car transfers with assist (PT) Description: LTG: Patient will perform car transfers with assistance (PT). Flowsheets (Taken 08/24/2020 1716) LTG: Pt will perform car transfers with assist:: (upgrade due to progress) Supervision/Verbal cueing Note: Upgrade due to progress   Problem: RH Ambulation Goal: LTG Patient will ambulate in home environment (PT) Description: LTG: Patient will ambulate in home environment, # of feet with assistance (PT). Flowsheets (Taken 08/24/2020 1716) LTG: Pt will ambulate in home environ  assist needed:: (upgrade due to progress) Supervision/Verbal cueing Note: Upgrade due to progress

## 2020-08-25 ENCOUNTER — Encounter (HOSPITAL_COMMUNITY): Payer: 59 | Admitting: Psychology

## 2020-08-25 ENCOUNTER — Inpatient Hospital Stay (HOSPITAL_COMMUNITY): Payer: 59

## 2020-08-25 ENCOUNTER — Inpatient Hospital Stay (HOSPITAL_COMMUNITY): Payer: 59 | Admitting: Physical Therapy

## 2020-08-25 DIAGNOSIS — S34109S Unspecified injury to unspecified level of lumbar spinal cord, sequela: Secondary | ICD-10-CM

## 2020-08-25 DIAGNOSIS — F4323 Adjustment disorder with mixed anxiety and depressed mood: Secondary | ICD-10-CM

## 2020-08-25 MED ORDER — METAXALONE 800 MG PO TABS
800.0000 mg | ORAL_TABLET | Freq: Three times a day (TID) | ORAL | Status: DC | PRN
  Administered 2020-08-25 – 2020-08-29 (×8): 800 mg via ORAL
  Filled 2020-08-25 (×9): qty 1

## 2020-08-25 MED ORDER — FLUDROCORTISONE ACETATE 0.1 MG PO TABS
0.2000 mg | ORAL_TABLET | Freq: Every day | ORAL | Status: DC
  Administered 2020-08-26 – 2020-08-31 (×6): 0.2 mg via ORAL
  Filled 2020-08-25 (×6): qty 2

## 2020-08-25 NOTE — Progress Notes (Signed)
Patient ID: Vanessa Medina, female   DOB: 09/03/1971, 48 y.o.   MRN: [id number]  SW met with pt in room to discuss family education. Reports son will be here late on Friday but not sure what time; and he will be here all day on Sunday. SW encouraged her to have son check with her about schedule for Friday and he can come to her scheduled session. Otherwise, he is on the schedule for Sunday for family edu.    Auria Chamberlain, MSW, LCSWA Office: [phone] Cell: [phone] Fax: [phone] 

## 2020-08-25 NOTE — Progress Notes (Signed)
Physical Therapy Session Note  Patient Details  Name: Vanessa Medina MRN: [id number] Date of Birth: Jan 24, 1971  Today's Date: 08/25/2020 PT Individual Time: 0800-0900; 1400-1500 PT Individual Time Calculation (min): 60 min and 60 min  Short Term Goals: Week 1:  PT Short Term Goal 1 (Week 1): pt to demonstrate ambulation for 100' CGA with LRAD PT Short Term Goal 2 (Week 1): pt to demonstrate functional transfers with LRAD at [facility] PT Short Term Goal 3 (Week 1): pt to demonstrate dynamic standing balance at [facility] for at least 5 mins PT Short Term Goal 4 (Week 1): pt to demonstrate supine<>sit CGA with LRAD  Skilled Therapeutic Interventions/Progress Updates:    Session 1: Pt received seated in bed, agreeable to PT session. Pt reports having a rough night with not much sleep due to ongoing BLE soreness as well as pain in posterior region of R thigh. Pt reports being premedicated prior to start of therapy session. Pt agreeable for therapist to attempt trigger point release to R thigh. Supine to sidelying at Supervision level. Trigger point release to R hamstrings 3 x 60 sec. Pt returned to supine. Supine BP 107/58 with no ACE wrap or abdominal binder. Supine to sitting EOB at Supervision level with use of bedrail and HOB elevated. Donned abdominal binder in sitting. Sit to stand with CGA to RW. Standing BP 102/65 with just abdominal binder. Assisted pt with donning thigh-high ACE wrap for improved BP management as pt reports some dizziness and continues to exhibit low BP with just use of abdominal binder. Stand pivot transfer to recliner with RW and CGA. Demonstrated seated HS stretch with gait belt for pt to stretch out RLE during downtime between therapy sessions. Pt left seated in recliner in room with needs in reach at end of session.  Session 2: Pt received seated in recliner in room, agreeable to PT session. Pt reports improvement in LE pain from this AM. Stand pivot transfer to w/c with RW and  CGA. Manual w/c propulsion x 150 ft with use of BUE at Supervision level. Seated BP with thigh-high ACE wrap: 119/77. Ambulation x 200 ft, x 250 ft with RW and CGA. Pt continues to exhibit improved tolerance for gait training with no instances of syncope noted. Standing alt L/R 4" step-ups with RW and min A for balance, 2 x 10 reps to fatigue. Ambulation through obstacle course navigating through cones and stepping over obstaces with RW and CGA to min A needed. Pt left seated in recliner in room with needs in reach at end of session.  Therapy Documentation Precautions:  Precautions Precautions: Fall Restrictions Weight Bearing Restrictions: No   Therapy/Group: Individual Therapy   Excell Seltzer, PT, DPT  08/25/2020, 12:16 PM

## 2020-08-25 NOTE — Progress Notes (Incomplete)
Patient awaken verbalizing a pain score rating 9/10 to left upper inner thigh area

## 2020-08-25 NOTE — Progress Notes (Signed)
Occupational Therapy Weekly Progress Note  Patient Details  Name: Vanessa Medina MRN: [id number] Date of Birth: 1970/10/02  Beginning of progress report period: August 19, 2020 End of progress report period: August 25, 2020  Pt is making steady progress with BADLs (bathing at shower level and dressing with sit<>stand from w/c), functional transfers, and functional amb with RW. Pt has experienced several orthostatic BP occurrences and is currently wearing an abdominal binder in addition to BLE wrapped with Ace wraps. Pt requires CGA for sit<>stand, standing balance, and functional transfers/ambulation with RW. Family has not been present for education.  Patient continues to demonstrate the following deficits: muscle weakness, decreased cardiorespiratoy endurance and decreased sitting balance, decreased standing balance, decreased postural control and decreased balance strategies, difficulties with blood pressure, and therefore will continue to benefit from skilled OT intervention to enhance overall performance with BADL, iADL and Reduce care partner burden.  Patient progressing toward long term goals..  Continue plan of care.  OT Short Term Goals Week 1:  OT Short Term Goal 1 (Week 1): LTG=STG 2/2 ELOS OT Short Term Goal 1 - Progress (Week 1): Progressing toward goal Week 2:  OT Short Term Goal 1 (Week 2): STG=LTG 2/2 ELOS (Supervision overall)   Vanessa Medina 08/25/2020, 6:17 AM

## 2020-08-25 NOTE — Progress Notes (Signed)
Occupational Therapy Session Note  Patient Details  Name: Vanessa Medina MRN: [id number] Date of Birth: 14-Feb-1971  Today's Date: 08/25/2020 OT Individual Time: 8882-8003 OT Individual Time Calculation (min): 75 min    Short Term Goals: Week 2:  OT Short Term Goal 1 (Week 2): STG=LTG 2/2 ELOS (Supervision overall)  Skilled Therapeutic Interventions/Progress Updates:    Pt resting in recliner upon arrival and agreeable to taking a shower this morning. Ace wraps donned from earlier therapy. No s/s of orthostasis when standing and amb with RW to bathroom. Pt completed bathing at shower level with lateral leans for buttocks. Pt transferred to w/c for return to room. Pt completed dressing tasks with CGA for standing balance and assistance wrapping BLE with Ace wraps. BP seated (ace wraps and NO abdominal binder)-125/70, standing-119/75. No s/s. Pt denied any s/s throughout session. Pt amb with RW to recliner. Pt remained in recliner with all needs within reach.  Therapy Documentation Precautions:  Precautions Precautions: Fall Restrictions Weight Bearing Restrictions: No  Pain:  Pt c/o R posterior thigh (hamstring); repositioned   Therapy/Group: Individual Therapy  Leroy Libman 08/25/2020, 11:40 AM

## 2020-08-25 NOTE — Consult Note (Signed)
Neuropsychological Consultation   Patient:   Vanessa Medina   DOB:   1971/06/15  MR Number:  [id number]  Location:  [facility] [address] [facility] [address] [place] [id number] [place] [address] Dept: [place]: [phone]           Date of Service:   08/25/2020  Start Time:   1 PM End Time:   2 PM  Provider/Observer:  Ilean Skill, Psy.D.       Clinical Neuropsychologist       Billing Code/Service: [phone]  Chief Complaint:    Vanessa Medina is a 49 year old female with history of hypertension, migraines, obesity, chronic hypertension, multiple back surgeries with chronic pain and lower left extremity lumbar radiculopathy.  Patient was scheduled to have full implantation of spinal cord stimulator after trialing had been completed.  During surgical implantation surgeon was unable to perform surgery due to scar tissue.  Postoperatively the patient awakened in excruciating pain in right lower extremity with Plegia and Allodynia.  Patient was admitted for work-up by Dr. Ronnald Ramp from the surgical center on 08/11/2020.  Thoracic MRI done revealing subtle increased T2/STIR intensity distal cord at T12-L1 level suspicious for edema/acute spinal cord injury.  There was no CSF leak as well as prior PLIF L2-L5 with residual of protrusion L3/5 potentially irritating right L3 nerve.  Patient has been having improved motor functioning and reduction in frequent headaches.  Patient has had long issues with hypotension with difficulty identifying etiological factors.  She has continued to have hypotensive events on the unit.  Patient also has a history of significant depression/bereavement following the death of her mother 4 years ago.  Patient has been having hypotensive events as well as falls and neurological changes prior to surgical interventions.  The patient's father continues to live with her and provide support as well as her son who  also is very supportive.  Reason for Service:  Patient was referred for neuropsychological consultation due to coping and adjustment issues.  Patient has had crying spells at times and difficulty coping with sudden loss of function.  Below is the HPI for the current admission.  HPI: Vanessa Carrasco. Medina is a 49 year old female with history of HTN, migraines, morbid obesity--BMI-41, chronic hypotension, multiple back surgeries with chronic pain with LLE lumbar radiculopathy who was scheduled to have spinal cord stimulator placed by Dr. Davy Pique on 08/11/2020 but unable to perform surgery due to scar tissue.  History taken from patient and chart review.  Post op on awakening, she had excruciating pain RLE with plegia and and allodynia. She was admitted for work up by Dr. Ronnald Ramp from surgical center on 08/11/2020. Thoracic MRI done revealing subtle increased T2/STIR intensity distal cord at T12-L1 level suspicious for edema/acute spinal cord injury, no CSF leak as well as prior PLIF L2-L5 with residual foraminal protrusion L3/5 potentially irritating right L3 nerve. She was started on IV Dilaudid, gabapentin as well as IV Decadron for management of pain, headaches and severe neuropathy.  She continues to have pain in RLE but is having some motor return, has constant headache, decreased hearing in right> left ear, constipation with nausea as well as epigastric pain and weakness. Therapy ongoing and CIR recommended due to functional decline.  Of note, she reports 50 lbs weigh loss in the past 3-4 months--due to issues with N/V/intake. Has had two falls last month---no recall of incident leading to fall question due to syncope. Was  in process of work up for renal disease. She has had issue with LLE weakness with pain for the past year and being followed by Dr. Ernestina Patches. Did well with stimulator trial.  Please see preadmission assessment from earlier today as well.  Current Status:  The patient was awake and alert sitting in  the reclining chair when entering the room.  Patient had lights off due to headache issues and pain.  Patient displayed good mental status.  Patient acknowledged times of crying and stress reports that she has had difficulties with coping since the passing of her mother 4 years ago.  However, she has/had made good adjustments to this over time but continues to have a lot of bereavement/depressive symptoms.  Patient reports that she is had a difficult time coping with the outcome of her surgery.  She had had spinal cord stimulator trial and done and experienced significant reduction in her severe pain symptoms and was quite hopeful that the full implantation would result in a significant improvement in quality of her life.  Patient was aware that she has had issues with excessive scar tissue developing.  Patient had previous gastric bypass surgery that resulted in severe scar tissue development and need for revision requiring extensive and extended surgical time due to scar tissue.  Behavioral Observation: Vanessa Medina  presents as a 49 y.o.-year-old Right Caucasian Female who appeared her stated age. her dress was Appropriate and she was Well Groomed and her manners were Appropriate to the situation.  her participation was indicative of Appropriate and Attentive behaviors.  There were physical disabilities noted.  she displayed an appropriate level of cooperation and motivation.     Interactions:    Active Appropriate and Attentive  Attention:   within normal limits and attention span and concentration were age appropriate  Memory:   within normal limits; recent and remote memory intact  Visuo-spatial:  not examined  Speech (Volume):  normal  Speech:   normal; normal  Thought Process:  Coherent and Relevant  Though Content:  WNL; not suicidal and not homicidal  Orientation:   person, place, time/date and situation  Judgment:   Good  Planning:   Good  Affect:    Depressed and  Tearful  Mood:    Dysphoric  Insight:   Good  Intelligence:   normal  Medical History:   Past Medical History:  Diagnosis Date  . Anemia   . Cervical cancer ([place])   . Family history of adverse reaction to anesthesia     " my mother takes a long time time wake up"  . GERD (gastroesophageal reflux disease)   . Headache    migraine  . History of shingles   . Hypertension   . Migraine   . Multiple allergies   . Obesity   . Osteoarthritis    left hip  . PONV (postoperative nausea and vomiting)   . Sleep apnea    does not wear CPAP  . Wears glasses          Patient Active Problem List   Diagnosis Date Noted  . Adjustment reaction with anxiety and depression   . Spinal cord injury, lumbar, without spinal bone injury, sequela ([place]) 08/18/2020  . Paraparesis ([place])   . Post-operative pain   . Hypotension   . Slow transit constipation   . AKI (acute kidney injury) ([place])   . Right leg weakness 08/11/2020  . DDD (degenerative disc disease), lumbar 09/02/2019    Class: Chronic  .  Degenerative disc disease, lumbar 09/02/2019  . Chest tightness 09/23/2018  . Bradycardia 09/23/2018  . Labile blood pressure 03/08/2018  . Chronic low back pain 09/03/2017  . History of total hip replacement, left 01/26/2016  . EDEMA 04/28/2008  . LEG PAIN, BILATERAL 12/16/2007  . CERVICAL CANCER 07/02/2007  . MORBID OBESITY 07/02/2007  . DEPRESSION 07/02/2007  . COMMON MIGRAINE 07/02/2007  . ALLERGIC RHINITIS 07/02/2007  . ASTHMA 07/02/2007  . GERD 07/02/2007  . ELEVATED BLOOD PRESSURE WITHOUT DIAGNOSIS OF HYPERTENSION 07/02/2007    Psychiatric History:  Patient with prior history of depression dating back to 2008 at least.  However, she had a significant exacerbation of her depression after the death of her mother approximately 4 years ago.  She has had chronic pain for some time with previous back surgery and recent complications during attempted spinal cord stimulator implantation  produced another further worsening of her physical status and increased pain experiences/symptoms.  Family Med/Psych History:  Family History  Problem Relation Age of Onset  . Renal Disease Mother   . Hypertension Mother   . Sudden Cardiac Death Mother   . Heart failure Mother   . Valvular heart disease Mother   . Heart disease Mother   . Stroke Brother   . Heart attack Brother 33  . Diabetes Other   . Breast cancer Cousin        paternal side   Impression/DX:  Vanessa Medina is a 49 year old female with history of hypertension, migraines, obesity, chronic hypertension, multiple back surgeries with chronic pain and lower left extremity lumbar radiculopathy.  Patient was scheduled to have full implantation of spinal cord stimulator after trialing had been completed.  During surgical implantation surgeon was unable to perform surgery due to scar tissue.  Postoperatively the patient awakened in excruciating pain in right lower extremity with Plegia and Allodynia.  Patient was admitted for work-up by Dr. Ronnald Ramp from the surgical center on 08/11/2020.  Thoracic MRI done revealing subtle increased T2/STIR intensity distal cord at T12-L1 level suspicious for edema/acute spinal cord injury.  There was no CSF leak as well as prior PLIF L2-L5 with residual of protrusion L3/5 potentially irritating right L3 nerve.  Patient has been having improved motor functioning and reduction in frequent headaches.  Patient has had long issues with hypotension with difficulty identifying etiological factors.  She has continued to have hypotensive events on the unit.  Patient also has a history of significant depression/bereavement following the death of her mother 4 years ago.  Patient has been having hypotensive events as well as falls and neurological changes prior to surgical interventions.  The patient's father continues to live with her and provide support as well as her son who also is very supportive.  The patient  was awake and alert sitting in the reclining chair when entering the room.  Patient had lights off due to headache issues and pain.  Patient displayed good mental status.  Patient acknowledged times of crying and stress reports that she has had difficulties with coping since the passing of her mother 4 years ago.  However, she has/had made good adjustments to this over time but continues to have a lot of bereavement/depressive symptoms.  Patient reports that she is had a difficult time coping with the outcome of her surgery.  She had had spinal cord stimulator trial and done and experienced significant reduction in her severe pain symptoms and was quite hopeful that the full implantation would result in a significant improvement  in quality of her life.  Patient was aware that she has had issues with excessive scar tissue developing.  Patient had previous gastric bypass surgery that resulted in severe scar tissue development and need for revision requiring extensive and extended surgical time due to scar tissue.  Disposition/Plan:  Will follow up with the patient first of next week to further assess depressive symptomatology and work on coping and adjustment skills.  Diagnosis:    Spinal cord injury, lumbar, without spinal bone injury, sequela ([facility]) - Plan: Ambulatory referral to Physical Medicine Rehab  Constipation         Electronically Signed   _______________________ Ilean Skill, Psy.D. Clinical Neuropsychologist

## 2020-08-25 NOTE — Progress Notes (Signed)
Vanessa Medina PHYSICAL MEDICINE & REHABILITATION PROGRESS NOTE   Subjective/Complaints:   Pt reports tried Flexeril- made her too sleepy- so will switch to Skelaxin.   BP doing OK per PT- no passing out- not  Bad.  Very emphatic needs to see Cards "another doctor"  To get more explanation about her orthostatic hypotension.    ROS:  Pt denies SOB, abd pain, CP, N/V/C/D, and vision changes   Objective:   No results found. Recent Labs    08/23/20 0619  WBC 7.4  HGB 10.3*  HCT 32.1*  PLT 170   Recent Labs    08/23/20 0619 08/24/20 0420  NA 139 139  K 3.4* 3.9  CL 107 109  CO2 23 20*  GLUCOSE 93 83  BUN 18 23*  CREATININE 1.05* 0.98  CALCIUM 9.0 9.3    Intake/Output Summary (Last 24 hours) at 08/25/2020 1153 Last data filed at 08/25/2020 0700 Gross per 24 hour  Intake 462 ml  Output --  Net 462 ml        Physical Exam: Vital Signs Blood pressure 116/67, pulse (!) 59, temperature 98.4 F (36.9 C), temperature source Oral, resp. rate 17, height 5' 1.42" (1.56 m), weight 114.4 kg, SpO2 100 %. Gen: sitting up in w/c at bedside, RN and PT at side, NAD HEENT: oral mucosa pink and moist, NCAT Cardio: borderline bradycardia Chest: CTA B/L- no W/R/R- good air movement Abd: Soft, NT, ND, (+)BS  Wearing abd binder and 1 ACE wraps RLE Ext: no edema Musculoskeletal:     Cervical back: Normal range of motion and neck supple.     Comments: Lower extremity edema  Severe TTP middle of posterior thigh- palpable trigger point in muscle.  Skin:    General: Skin is warm and dry.     Comments: Incision with dressing CDI  Neurological:     Comments: Mild left facial weakness with ptosis and halting speech at times.--baseline per son.  Motor: RUE: 4 -/5 proximal distally in LUE:/5 proximal RLE: Blood, extension 3 5/5, ankle dorsiflexion 2-5/5 LLE: Hip flexion, knee extension, dorsiflexion 4 -/5  Psychiatric: tearful intermittently- but  perseverative   Assessment/Plan: 1. Functional deficits which require 3+ hours per day of interdisciplinary therapy in a comprehensive inpatient rehab setting.  Physiatrist is providing close team supervision and 24 hour management of active medical problems listed below.  Physiatrist and rehab team continue to assess barriers to discharge/monitor patient progress toward functional and medical goals  Care Tool:  Bathing    Body parts bathed by patient: Right arm,Left arm,Chest,Abdomen,Front perineal area,Buttocks,Right upper leg,Left upper leg,Right lower leg,Left lower leg,Face   Body parts bathed by helper: Left lower leg     Bathing assist Assist Level: Contact Guard/Touching assist     Upper Body Dressing/Undressing Upper body dressing   What is the patient wearing?: Pull over shirt    Upper body assist Assist Level: Supervision/Verbal cueing    Lower Body Dressing/Undressing Lower body dressing      What is the patient wearing?: Pants     Lower body assist Assist for lower body dressing: Contact Guard/Touching assist     Toileting Toileting    Toileting assist Assist for toileting: Minimal Assistance - Patient > 75%     Transfers Chair/bed transfer  Transfers assist     Chair/bed transfer assist level: Contact Guard/Touching assist     Locomotion Ambulation   Ambulation assist      Assist level: Contact Guard/Touching assist Assistive device: Walker-rolling  Max distance: 150'   Walk 10 feet activity   Assist     Assist level: Contact Guard/Touching assist Assistive device: Walker-rolling   Walk 50 feet activity   Assist Walk 50 feet with 2 turns activity did not occur: Safety/medical concerns  Assist level: Contact Guard/Touching assist Assistive device: Walker-rolling    Walk 150 feet activity   Assist Walk 150 feet activity did not occur: Safety/medical concerns  Assist level: Contact Guard/Touching assist Assistive  device: Walker-rolling    Walk 10 feet on uneven surface  activity   Assist Walk 10 feet on uneven surfaces activity did not occur: Safety/medical concerns         Wheelchair     Assist Will patient use wheelchair at discharge?: Yes Type of Wheelchair: Manual    Wheelchair assist level: Supervision/Verbal cueing Max wheelchair distance: 345ft    Wheelchair 50 feet with 2 turns activity    Assist        Assist Level: Supervision/Verbal cueing   Wheelchair 150 feet activity     Assist      Assist Level: Supervision/Verbal cueing   Blood pressure 116/67, pulse (!) 59, temperature 98.4 F (36.9 C), temperature source Oral, resp. rate 17, height 5' 1.42" (1.56 m), weight 114.4 kg, SpO2 100 %.  Medical Problem List and Plan: 1.  Deficits with mobility, transfers, endurance, self-care secondary to paraparesis.              -patient may shower with incision dressed             -ELOS/Goals: 10-14 days/supervision/mod I  12/16- pt wants ot get home by birthday if possible on 12/26- will see if possible  12/18- had orthostasis for 4 years per pt   12/19- explained likely not going home by birthday if we are going to work on BP issues/weakness- pt voiced understanding             Continue CIR 2.  Antithrombotics: -DVT/anticoagulation:  Pharmaceutical: Lovenox             -antiplatelet therapy: N/A 3. Pain Management: On IV decadron, robaxin and tylenol prn.              --continue gabapentin 400 mg tid for neuropathy  12/16- has Dilaudid 4 mg q4 hours prn for severe pain- if that's not appropriate, or has allergic reaciton, will try Nucynta- she's had good Sx's relief from that in past.   12/18- will change topamax and gabapentin to all dose at bedtime. Per pt request.   12/19- slept much better with meds at night 4. Mood: LCSW to follow for evaluation and support.              -antipsychotic agents: N/A 5. Neuropsych: This patient is capable of making  decisions on her own behalf. 6. Skin/Wound Care: Routine pressure relief measures.  7. Fluids/Electrolytes/Nutrition: Monitor I/Os. Discontinue IVF and encourage fluid intake.              12/20: K+ is low- replete with 31meq kdur today and repeat level tomorrow.   12/21- K+ up to 3.9 today 8. Migraines: Takes Topamax at bedtime. Now constant due to spinal cord injury. Supportive care recommended.  12/16- will try increasing Topamax to 50 mg BID to help with migraine prophylaxis.   12/18- change to bedtime per pt request.  12/19- worked much better- slept well- not sleepy during day as much   12/22- no migraines per pt lately.  9. Constipation:  Likely obstipation as has not had BM since admission.             KUB ordered to determine stool burden.               Continue Reglan order enema and augment bowel regimen.   12/16- really constipated- will try Sorbitol today after therapy- if doesn't work, might require Mg citrate.   12/18- LBM documented 12/16- will con't to monitor- might need more sorbitol tomorrow.   12/19- LBM yesterday again- con't sorbitol prn 10. Bradycardia: Question due to spinal injury--HR has in 40's but been as low as 30's.              12/17: HR improved to 51, continue to monitor TID  12/22- HR- 59- doing better 11. Chronic hypotension:  On midodrine tid with florinef per Dr. Rockey Situ.               Orthostatic vital signs ordered  Add abdominal binder and ted hose.              Monitor with increased exertion  12/18- just started Florinef- will do for a few days, then increase.    12/19- BP sitting 110s/60s- con't regimen  12/22- increased Florinef- today- has been 5 days- to 0.2 mg daily. Con't abd binder and ACE wraps- not doing TEDs.  12. Morbid obesity s/p gastric sleeve: Encourage/educate on weight loss and dietary changes to help promote overall health and mobility.  13. Acute on chronic renal failure: Ultrasound 08/2020 showing medical renal disease.  BUN/SCr-26/1.7 on 11/30. Had normal renal function a year ago. No labs since 12/08--BUN/SCr-44/3.40. Has been on IVF for hydration.              CMP ordered for tomorrow a.m.                      Will also order PVRs to rule out neurogenic bladder as cause.   12/16- Cr down to 1.44- not sure what her baseline is- (1 year ago was 1.05 or so). Will keep off celebrex.  12/17: encouraged hydration  12/18- will recheck labs on Monday.  12/20: Cr much improved to 1.05. Repeat in 1 week.  12/21- Cr down to 0.98-   14. Grief/tearfulness  12/19- will get Neuropsychology to see pt.  15. Anemia: Hgb is 10.3 on 12/20, repeat in 1 week.    16. R posterior thigh pain  12/21- will change robaxin to flexeril 10 mg tid prn  12/22- change flexeril to skelaxin 800 mg TID prn- doesn't cause sedation.   LOS: 7 days A FACE TO FACE EVALUATION WAS PERFORMED  Vanessa Medina 08/25/2020, 11:53 AM

## 2020-08-26 ENCOUNTER — Inpatient Hospital Stay (HOSPITAL_COMMUNITY): Payer: 59

## 2020-08-26 ENCOUNTER — Inpatient Hospital Stay (HOSPITAL_COMMUNITY): Payer: 59 | Admitting: Physical Therapy

## 2020-08-26 LAB — BASIC METABOLIC PANEL
Anion gap: 10 (ref 5–15)
BUN: 18 mg/dL (ref 6–20)
CO2: 22 mmol/L (ref 22–32)
Calcium: 8.9 mg/dL (ref 8.9–10.3)
Chloride: 107 mmol/L (ref 98–111)
Creatinine, Ser: 0.99 mg/dL (ref 0.44–1.00)
GFR, Estimated: >60 mL/min (ref >60)
Glucose, Bld: 92 mg/dL (ref 70–99)
Potassium: 3.7 mmol/L (ref 3.5–5.1)
Sodium: 139 mmol/L (ref 135–145)

## 2020-08-26 LAB — TSH: TSH: 1.879 u[IU]/mL (ref 0.350–4.500)

## 2020-08-26 NOTE — Plan of Care (Signed)
  Problem: RH Dressing Goal: LTG Patient will perform upper body dressing (OT) Description: LTG Patient will perform upper body dressing with assist, with/without cues (OT). Flowsheets (Taken 08/26/2020 1154) LTG: Pt will perform upper body dressing with assistance level of: (upgraded JLS) Independent Goal: LTG Patient will perform lower body dressing w/assist (OT) Description: LTG: Patient will perform lower body dressing with assist, with/without cues in positioning using equipment (OT) Flowsheets (Taken 08/26/2020 1154) LTG: Pt will perform lower body dressing with assistance level of: (upgraded JLS) Independent with assistive device Note: upgraded JLS   Problem: RH Toileting Goal: LTG Patient will perform toileting task (3/3 steps) with assistance level (OT) Description: LTG: Patient will perform toileting task (3/3 steps) with assistance level (OT)  Flowsheets (Taken 08/26/2020 1154) LTG: Pt will perform toileting task (3/3 steps) with assistance level: (upgraded JLS) Independent with assistive device Note: upgraded JLS   Problem: RH Toilet Transfers Goal: LTG Patient will perform toilet transfers w/assist (OT) Description: LTG: Patient will perform toilet transfers with assist, with/without cues using equipment (OT) Flowsheets (Taken 08/26/2020 1154) LTG: Pt will perform toilet transfers with assistance level of: (upgraded JLS) Independent with assistive device Note: upgraded JLS

## 2020-08-26 NOTE — Progress Notes (Signed)
  Occupational Therapy Session Note  Patient Details  Name: Vanessa Medina MRN: [id number] Date of Birth: [birth year]  Today's Date: 08/26/2020 OT Individual Time: 0900-1000 OT Individual Time Calculation (min): 60 min    Short Term Goals: Week 2:  OT Short Term Goal 1 (Week 2): STG=LTG 2/2 ELOS (Supervision overall)  Skilled Therapeutic Interventions/Progress Updates:    Pt resting in recliner upon arrival.  Pt commented that she had a "good" night's sleep and was ready for the day. OT intervention with focus on bathing at tub/shower level (tub room) and dressing with sit<>stand from w/c. Pt amb with RW into tub room and transferred with CGA. Pt completed bathing/dressing tasks with CGA. Pt requires more then a reasonable amount of time to complete all tasks. No safety concerns. Pt returned to room and completed grooming tasks at sink before returning to recliner. Ace Wraps applied to BLE. Pt remained in relciner with all needs within reach.   Therapy Documentation Precautions:  Precautions Precautions: Fall Restrictions Weight Bearing Restrictions: No   Pain:  Pt denies pain this morning.   Therapy/Group: Individual Therapy  Leroy Libman 08/26/2020, 10:08 AM

## 2020-08-26 NOTE — Progress Notes (Signed)
Physical Therapy Session Note  Patient Details  Name: Vanessa Medina MRN: [id number] Date of Birth: 04/17/1971  Today's Date: 08/26/2020 PT Individual Time: 1101-1200 PT Individual Time Calculation (min): 59 min   Short Term Goals: Week 1:  PT Short Term Goal 1 (Week 1): pt to demonstrate ambulation for 100' CGA with LRAD PT Short Term Goal 2 (Week 1): pt to demonstrate functional transfers with LRAD at [facility] PT Short Term Goal 3 (Week 1): pt to demonstrate dynamic standing balance at [facility] for at least 5 mins PT Short Term Goal 4 (Week 1): pt to demonstrate supine<>sit CGA with LRAD      Skilled Therapeutic Interventions/Progress Updates:    pt received in recliner and agreeable to therapy. Pt denied pain at start and end of session. Pt reported she slept well last night and feels good this AM but did request to adjust ace wraps on BLE for improved positioning, pt Sit to stand from recliner to Rolling walker CGA, to lower pants for access to ace wraps, PT completed ace wrap correction and pt reported improved comfort. Pt denied dizziness and BP taken to 136/72. Pt then directed in Stand pivot transfer with Rolling walker CGA, then directed in [facility] mobility from room to day room supervision. Pt directed in Sit to stand to Rolling walker and BP taken in standing to be 127/76 with 2 mins in standing, pt denied all symptoms of dizziness, lightheadedness, or change in vision. Pt then directed in gait training with Rolling walker for 250' CGA with WC managed by PT for safety however no LOB or dizziness per pt, seated rest break taken at this distance and pt requested to ambulate again reporting she was very motivated to walk this session. Pt directed in additional 250' with Rolling walker CGA with PT managing WC for safety VC for trunk extension and increased stride length. Pt completed 250' at room and requested to return to recliner. Supervision for this transfer with Rolling walker. Pt had questions  about Rolling walker vs. Rollator and educated on difference between the two and that pt could attempt gait with rollator with therapy next session to assess if this is safe for pt. She agreed and reported she would use whichever was recommended. Pt left in recliner, All needs in reach and in good condition. Call light in hand.    Therapy Documentation Precautions:  Precautions Precautions: Fall Restrictions Weight Bearing Restrictions: No General:   Vital Signs: Therapy Vitals Temp: 98.5 F (36.9 C) Pulse Rate: (!) 59 Resp: 16 BP: 117/74 Patient Position (if appropriate): Sitting Oxygen Therapy SpO2: 100 % O2 Device: Room Air Pain:   Mobility:   Locomotion :    Trunk/Postural Assessment :    Balance:   Exercises:   Other Treatments:      Therapy/Group: Individual Therapy  Junie Panning 08/26/2020, 2:36 PM

## 2020-08-26 NOTE — Progress Notes (Signed)
[place] PHYSICAL MEDICINE & REHABILITATION PROGRESS NOTE   Subjective/Complaints:   Pt slept the best "ever" last night- skelaxin was very helpful for posterior leg pain.   No issues otherwise- BP has been good.   ROS:  Pt denies SOB, abd pain, CP, N/V/C/D, and vision changes  Objective:   No results found. No results for input(s): WBC, HGB, HCT, PLT in the last 72 hours. Recent Labs    08/24/20 0420 08/26/20 0407  NA 139 139  K 3.9 3.7  CL 109 107  CO2 20* 22  GLUCOSE 83 92  BUN 23* 18  CREATININE 0.98 0.99  CALCIUM 9.3 8.9    Intake/Output Summary (Last 24 hours) at 08/26/2020 1011 Last data filed at 08/26/2020 0700 Gross per 24 hour  Intake 920 ml  Output --  Net 920 ml        Physical Exam: Vital Signs Blood pressure 129/71, pulse 76, temperature 98.3 F (36.8 C), temperature source Oral, resp. rate 18, height 5' 1.42" (1.56 m), weight 114.4 kg, SpO2 99 %. Gen: sitting up in bed, sleepy, NAD HEENT: oral mucosa pink and moist, NCAT Cardio: RRR Chest: CTA B/L- no W/R/R- good air movement Abd: Soft, NT, ND, (+)BS  Not wearing abd binder, since in bed Ext: no edema Musculoskeletal:     Cervical back: Normal range of motion and neck supple.     Comments: Lower extremity edema  Severe TTP middle of posterior thigh- palpable trigger point in muscle. Slightly less TTP  Skin:    General: Skin is warm and dry.     Comments: Incision with dressing CDI  Neurological:     Comments: Mild left facial weakness with ptosis and halting speech at times.--baseline per son.  Motor: RUE: 4 -/5 proximal distally in LUE:/5 proximal RLE: Blood, extension 3 5/5, ankle dorsiflexion 2-5/5 LLE: Hip flexion, knee extension, dorsiflexion 4 -/5  Psychiatric: appropriate   Assessment/Plan: 1. Functional deficits which require 3+ hours per day of interdisciplinary therapy in a comprehensive inpatient rehab setting.  Physiatrist is providing close team supervision and 24  hour management of active medical problems listed below.  Physiatrist and rehab team continue to assess barriers to discharge/monitor patient progress toward functional and medical goals  Care Tool:  Bathing    Body parts bathed by patient: Right arm,Left arm,Chest,Abdomen,Front perineal area,Buttocks,Right upper leg,Left upper leg,Right lower leg,Left lower leg,Face   Body parts bathed by helper: Left lower leg     Bathing assist Assist Level: Supervision/Verbal cueing     Upper Body Dressing/Undressing Upper body dressing   What is the patient wearing?: Pull over shirt    Upper body assist Assist Level: Independent    Lower Body Dressing/Undressing Lower body dressing      What is the patient wearing?: Pants     Lower body assist Assist for lower body dressing: Contact Guard/Touching assist     Toileting Toileting    Toileting assist Assist for toileting: Minimal Assistance - Patient > 75%     Transfers Chair/bed transfer  Transfers assist     Chair/bed transfer assist level: Contact Guard/Touching assist     Locomotion Ambulation   Ambulation assist      Assist level: Contact Guard/Touching assist Assistive device: Walker-rolling Max distance: 250'   Walk 10 feet activity   Assist     Assist level: Contact Guard/Touching assist Assistive device: Walker-rolling   Walk 50 feet activity   Assist Walk 50 feet with 2 turns activity did  not occur: Safety/medical concerns  Assist level: Contact Guard/Touching assist Assistive device: Walker-rolling    Walk 150 feet activity   Assist Walk 150 feet activity did not occur: Safety/medical concerns  Assist level: Contact Guard/Touching assist Assistive device: Walker-rolling    Walk 10 feet on uneven surface  activity   Assist Walk 10 feet on uneven surfaces activity did not occur: Safety/medical concerns         Wheelchair     Assist Will patient use wheelchair at  discharge?: Yes Type of Wheelchair: Manual    Wheelchair assist level: Supervision/Verbal cueing Max wheelchair distance: 384ft    Wheelchair 50 feet with 2 turns activity    Assist        Assist Level: Supervision/Verbal cueing   Wheelchair 150 feet activity     Assist      Assist Level: Supervision/Verbal cueing   Blood pressure 129/71, pulse 76, temperature 98.3 F (36.8 C), temperature source Oral, resp. rate 18, height 5' 1.42" (1.56 m), weight 114.4 kg, SpO2 99 %.  Medical Problem List and Plan: 1.  Deficits with mobility, transfers, endurance, self-care secondary to paraparesis.              -patient may shower with incision dressed             -ELOS/Goals: 10-14 days/supervision/mod I  12/16- pt wants ot get home by birthday if possible on 12/26- will see if possible  12/18- had orthostasis for 4 years per pt   12/19- explained likely not going home by birthday if we are going to work on BP issues/weakness- pt voiced understanding             Continue CIR 2.  Antithrombotics: -DVT/anticoagulation:  Pharmaceutical: Lovenox             -antiplatelet therapy: N/A 3. Pain Management: On IV decadron, robaxin and tylenol prn.              --continue gabapentin 400 mg tid for neuropathy  12/16- has Dilaudid 4 mg q4 hours prn for severe pain- if that's not appropriate, or has allergic reaciton, will try Nucynta- she's had good Sx's relief from that in past.   12/18- will change topamax and gabapentin to all dose at bedtime. Per pt request.   12/19- slept much better with meds at night 4. Mood: LCSW to follow for evaluation and support.              -antipsychotic agents: N/A 5. Neuropsych: This patient is capable of making decisions on her own behalf. 6. Skin/Wound Care: Routine pressure relief measures.  7. Fluids/Electrolytes/Nutrition: Monitor I/Os. Discontinue IVF and encourage fluid intake.              12/20: K+ is low- replete with 22meq kdur today and  repeat level tomorrow.   12/21- K+ up to 3.9 today  12/23- K+ 3.7- will recheck on Monday 8. Migraines: Takes Topamax at bedtime. Now constant due to spinal cord injury. Supportive care recommended.  12/16- will try increasing Topamax to 50 mg BID to help with migraine prophylaxis.   12/18- change to bedtime per pt request.  12/19- worked much better- slept well- not sleepy during day as much   12/22- no migraines per pt lately.  9. Constipation: Likely obstipation as has not had BM since admission.             KUB ordered to determine stool burden.  Continue Reglan order enema and augment bowel regimen.   12/16- really constipated- will try Sorbitol today after therapy- if doesn't work, might require Mg citrate.   12/18- LBM documented 12/16- will con't to monitor- might need more sorbitol tomorrow.   12/19- LBM yesterday again- con't sorbitol prn 10. Bradycardia: Question due to spinal injury--HR has in 40's but been as low as 30's.              12/17: HR improved to 51, continue to monitor TID  12/22- HR- 59- doing better 11. Chronic hypotension:  On midodrine tid with florinef per Dr. Rockey Situ.               Orthostatic vital signs ordered  Add abdominal binder and ted hose.              Monitor with increased exertion  12/18- just started Florinef- will do for a few days, then increase.    12/19- BP sitting 110s/60s- con't regimen  12/22- increased Florinef- today- has been 5 days- to 0.2 mg daily. Con't abd binder and ACE wraps- not doing TEDs.   12/23- BP doing better- con't regimen 12. Morbid obesity s/p gastric sleeve: Encourage/educate on weight loss and dietary changes to help promote overall health and mobility.  13. Acute on chronic renal failure: Ultrasound 08/2020 showing medical renal disease. BUN/SCr-26/1.7 on 11/30. Had normal renal function a year ago. No labs since 12/08--BUN/SCr-44/3.40. Has been on IVF for hydration.              CMP ordered for tomorrow  a.m.                      Will also order PVRs to rule out neurogenic bladder as cause.   12/16- Cr down to 1.44- not sure what her baseline is- (1 year ago was 1.05 or so). Will keep off celebrex.  12/17: encouraged hydration  12/18- will recheck labs on Monday.  12/20: Cr much improved to 1.05. Repeat in 1 week.  12/21- Cr down to 0.98-   12/23- Cr 0.99- con't regimen  14. Grief/tearfulness  12/19- will get Neuropsychology to see pt.  15. Anemia: Hgb is 10.3 on 12/20, repeat in 1 week.    16. R posterior thigh pain  12/21- will change robaxin to flexeril 10 mg tid prn  12/22- change flexeril to skelaxin 800 mg TID prn- doesn't cause sedation.   12/23- skelaxin was helpful- con't regimen  LOS: 8 days A FACE TO FACE EVALUATION WAS PERFORMED  Vanessa Medina 08/26/2020, 10:11 AM

## 2020-08-26 NOTE — Progress Notes (Signed)
Occupational Therapy Session Note  Patient Details  Name: AIYANAH KALAMA MRN: [id number] Date of Birth: [birth year]  Today's Date: 08/26/2020 OT Individual Time: 1300-1400 OT Individual Time Calculation (min): 60 min    Short Term Goals: Week 2:  OT Short Term Goal 1 (Week 2): STG=LTG 2/2 ELOS (Supervision overall)  Skilled Therapeutic Interventions/Progress Updates:    OT intervention with focus on sit<>stand and standing balance during Wii activities. Pt engaged in Wii bowling, standing for a complete game. No LOB noted. Pt also engaged in Wii balance activity X 3 (penguin balance). Pt with delayed balance reactions/weight shifts. Pt returned to room and transferred to recliner. Pt remained in recliner with all needs within reach.   Therapy Documentation Precautions:  Precautions Precautions: Fall Restrictions Weight Bearing Restrictions: No Pain:  Pt states her back "hurts a little" but does not need pain meds   Therapy/Group: Individual Therapy  Leroy Libman 08/26/2020, 2:52 PM

## 2020-08-27 ENCOUNTER — Inpatient Hospital Stay (HOSPITAL_COMMUNITY): Payer: 59 | Admitting: Occupational Therapy

## 2020-08-27 ENCOUNTER — Encounter (HOSPITAL_COMMUNITY): Payer: 59 | Admitting: Occupational Therapy

## 2020-08-27 ENCOUNTER — Ambulatory Visit (HOSPITAL_COMMUNITY): Payer: 59 | Admitting: Physical Therapy

## 2020-08-27 NOTE — Progress Notes (Signed)
Physical Therapy Session Note  Patient Details  Name: Vanessa Medina MRN: [id number] Date of Birth: 07-Jan-1971  Today's Date: 08/27/2020 PT Individual Time: 1300-1355 PT Individual Time Calculation (min): 55 min   Short Term Goals: Week 1:  PT Short Term Goal 1 (Week 1): pt to demonstrate ambulation for 100' CGA with LRAD PT Short Term Goal 1 - Progress (Week 1): Met PT Short Term Goal 2 (Week 1): pt to demonstrate functional transfers with LRAD at [facility] PT Short Term Goal 2 - Progress (Week 1): Met PT Short Term Goal 3 (Week 1): pt to demonstrate dynamic standing balance at CGA for at least 5 mins PT Short Term Goal 3 - Progress (Week 1): Met PT Short Term Goal 4 (Week 1): pt to demonstrate supine<>sit CGA with LRAD PT Short Term Goal 4 - Progress (Week 1): Met  Skilled Therapeutic Interventions/Progress Updates:  Pt received seated in w/c in room with son and daughter in law present for hands on family education session. No complaints of pain at rest, does have onset of low back pain with mobility. Pt utilizing thigh-high TED hose during session for BP management. No issues with BP throughout session. Education with patient and family regarding BP management at home and use of TEDs, ACE wrap if needed, and abdominal binder if needed. Sit to stand at Supervision level to RW throughout session. Ambulation up to 150 ft with RW at Supervision level. Ascend/descend 4 x 6" stairs with R handrail laterally at min A level, increased time and cues needed for safe LE management. Pt's son able to perform return demo of assisting pt on stairs. Pt and family with good understanding of safe stair management. Car transfer with min A and use of RW due to low seat height to simulate [employer]. Bed mobility on real bed in therapy apartment at Supervision level with use of bedrail for supine to/from sit with cues for log roll technique. Pt reports she has a bedrail at home already. Pt returned to room at  end of session. Pt and family demonstrate good understanding of how to perform safe mobility at home. Pt left seated in w/c in room with needs in reach, family present. Pt and family with no further questions.   Therapy Documentation Precautions:  Precautions Precautions: Fall Restrictions Weight Bearing Restrictions: No   Therapy/Group: Individual Therapy   Excell Seltzer, PT, DPT  08/27/2020, 4:47 PM

## 2020-08-27 NOTE — Progress Notes (Signed)
Inpatient Rehabilitation Care Coordinator Discharge Note  The overall goal for the admission was met for:   Discharge location: Yes. D/c to home with support from father and son.   Length of Stay: Yes. 13 days.   Discharge activity level: Yes. Supervision.  Home/community participation: Yes. Limited.   Services provided included: MD, RD, PT, OT, RN, CM, TR, Pharmacy, Neuropsych and SW  Financial Services: Private Insurance: [facility]  Choices offered to/list presented to: List presented  Follow-up services arranged: Outpatient: [facility] for PT/OT  Comments (or additional information): contact pt [phone]  Patient/Family verbalized understanding of follow-up arrangements: Yes  Individual responsible for coordination of the follow-up plan: Pt to have assistance with coordinating care needs.   Confirmed correct DME delivered: Rana Snare 08/27/2020    Rana Snare

## 2020-08-27 NOTE — Progress Notes (Signed)
Occupational Therapy Session Note  Patient Details  Name: Vanessa Medina MRN: [id number] Date of Birth: 1970/09/21  Today's Date: 08/27/2020 OT Individual Time: 0900-1000   &   0712-1975 OT Individual Time Calculation (min): 60 min    &   58 min   Short Term Goals: Week 1:  OT Short Term Goal 1 (Week 1): LTG=STG 2/2 ELOS OT Short Term Goal 1 - Progress (Week 1): Progressing toward goal Week 2:  OT Short Term Goal 1 (Week 2): STG=LTG 2/2 ELOS (Supervision overall)  Skilled Therapeutic Interventions/Progress Updates:    AM session:   Patient seated w/c level, denies pain and states that she is ready for therapy session.   She requests a shower this session and has already gathered her clothing.  SPT w/ RW to/from shower bench with CG/CS.  She completed bathing seated on shower bench with set up/CS.  Dressing completed w/c level - OH shirt independent, pants CS in stance for CM , mod A to donn thigh high teds, she is able to donn sneakers but dependent to tie laces.  She is able to complete grooming tasks w/c level mod I.  She ambulated with RW CS level >300 feet with w/c follow.  She remained seated in w/c at close of session, call bell and tray table in reach.     PM session:   Family education - Patient and son (& son's girlfriend) present for therapy session this afternoon.  Reviewed role of OT and patients current level of function - provided recommendations for set up of DME - discussed non-skid/slip surfaces.  Patient demonstrated ambulation with RW to/from tub transfer bench and use in tub/shower - son demonstrates good awareness and safety with guarding with ambulation and transfer.  Reviewed and practiced reach and transport of items in kitchen environment, back safety with basic HM tasks, back precautions.  Discussed set up of crafting area and ergonomics with activities.  Good awareness noted.  Provided and laced shoes with elastic laces - she was able to donn sneakers without  difficulty using elastic laces.  She ambulated >500 feet on unit with supervision.  Reviewed safety and awareness with blood pressure fluctuations.  Patient and son demonstrate good safety and preparedness for discharge.  Patient remained seated in w/c at close of session.  Call bell and tray table in reach.      Therapy Documentation Precautions:  Precautions Precautions: Fall Restrictions Weight Bearing Restrictions: No  Therapy/Group: Individual Therapy  Carlos Levering 08/27/2020, 7:38 AM

## 2020-08-27 NOTE — Progress Notes (Signed)
Physical Therapy Discharge Summary  Patient Details  Name: Vanessa Medina MRN: [id number] Date of Birth: June 12, 1971  Today's Date: 08/27/2020  Patient has met 9 of 9 long term goals due to improved activity tolerance, improved balance, improved postural control and ability to compensate for deficits.  Patient to discharge at an ambulatory level Supervision.  Patient's care partner is independent to provide the necessary physical assistance at discharge. Pt's son and daughter in law have completed hands on family education and are safe to assist pt upon d/c home.  All goals met   Recommendation:  Patient will benefit from ongoing skilled PT services in outpatient setting to continue to advance safe functional mobility, address ongoing impairments in endurance, strength, balance, safety, independence with functional mobility, and minimize fall risk.  Equipment: No equipment provided. Pt already owns all necessary equipment.  Reasons for discharge: treatment goals met and discharge from hospital  Patient/family agrees with progress made and goals achieved: Yes  PT Discharge Precautions/Restrictions Precautions Precautions: Fall Precaution Comments: watch BP Restrictions Weight Bearing Restrictions: No Cognition Overall Cognitive Status: Within Functional Limits for tasks assessed Arousal/Alertness: Awake/alert Orientation Level: Oriented X4 Attention: Sustained;Focused Focused Attention: Appears intact Sustained Attention: Appears intact Memory: Appears intact Awareness: Appears intact Problem Solving: Appears intact Safety/Judgment: Appears intact Sensation Sensation Light Touch: Appears Intact Proprioception: Appears Intact Coordination Gross Motor Movements are Fluid and Coordinated: No Fine Motor Movements are Fluid and Coordinated: Yes Coordination and Movement Description: mild uncoordination due to generalized weakness and decreased endurance Finger Nose Finger  Test: [facility] bilaterally Heel Shin Test: [facility] bilaterally Motor  Motor Motor: Within Functional Limits Motor - Skilled Clinical Observations: generalized weakness and decreased endurance  Mobility Bed Mobility Bed Mobility: Rolling Right;Rolling Left;Sit to Supine;Supine to Sit Rolling Right: Independent with assistive device Rolling Left: Independent with assistive device Supine to Sit: Independent with assistive device Sit to Supine: Independent with assistive device Transfers Transfers: Sit to Stand;Stand to Sit;Stand Pivot Transfers Sit to Stand: Supervision/Verbal cueing Stand to Sit: Supervision/Verbal cueing Stand Pivot Transfers: Supervision/Verbal cueing Stand Pivot Transfer Details: Verbal cues for safe use of DME/AE Stand Pivot Transfer Details (indicate cue type and reason): occasional verbal cues for RW safety Transfer (Assistive device): Rolling walker Locomotion  Gait Ambulation: Yes Gait Assistance: Supervision/Verbal cueing Gait Distance (Feet): 150 Feet Assistive device: Rolling walker Gait Assistance Details: Verbal cues for safe use of DME/AE;Verbal cues for technique Gait Assistance Details: verbal cues for energy conservation and occasional cues for RW management when turning Gait Gait: Yes Gait Pattern: Impaired Gait Pattern: Decreased stride length;Trunk flexed;Poor foot clearance - right;Step-through pattern;Decreased step length - left;Decreased step length - right;Poor foot clearance - left;Decreased trunk rotation Gait velocity: decreased Stairs / Additional Locomotion Stairs: Yes Stairs Assistance: Contact Guard/Touching assist Stair Management Technique: One rail Right Number of Stairs: 8 Height of Stairs: 6 Ramp: Supervision/Verbal cueing (RW) Product manager Mobility: Yes Wheelchair Assistance: Chartered loss adjuster: Both upper extremities Wheelchair Parts Management: Supervision/cueing Distance: 368f   Trunk/Postural Assessment  Cervical Assessment Cervical Assessment: Within Functional Limits Thoracic Assessment Thoracic Assessment: Within Functional Limits Lumbar Assessment Lumbar Assessment: Within Functional Limits Postural Control Postural Control: Deficits on evaluation  Balance Balance Balance Assessed: Yes Static Sitting Balance Static Sitting - Balance Support: Feet supported;No upper extremity supported Static Sitting - Level of Assistance: 7: Independent Dynamic Sitting Balance Dynamic Sitting - Balance Support: Feet supported;No upper extremity supported Dynamic Sitting - Level of Assistance: 6: Modified independent (Device/Increase time) Static Standing Balance  Static Standing - Balance Support: Bilateral upper extremity supported (RW) Static Standing - Level of Assistance: 5: Stand by assistance (supervision) Dynamic Standing Balance Dynamic Standing - Balance Support: Bilateral upper extremity supported (RW) Dynamic Standing - Level of Assistance: 5: Stand by assistance (supervision) Extremity Assessment  RLE Assessment RLE Assessment: Exceptions to [facility] General Strength Comments: grossly generalized to 4-/5 (except knee flexion, extension, and hip adduction 3+/5) LLE Assessment LLE Assessment: Exceptions to [facility] General Strength Comments: grossly generalized to 4-/5 (except knee flexion, extension, and hip adduction 3+/5)   Excell Seltzer, PT, DPT Becky Sax PT, DPT  08/27/2020, 4:47 PM

## 2020-08-27 NOTE — Progress Notes (Signed)
[place] PHYSICAL MEDICINE & REHABILITATION PROGRESS NOTE   Subjective/Complaints:   Pt slept "great" again last night- fantastic sleep.  Hasn't taken skelaxin during day- suggested doing so- since not usually sedating.    ROS:  Pt denies SOB, abd pain, CP, N/V/C/D, and vision changes   Objective:   No results found. No results for input(s): WBC, HGB, HCT, PLT in the last 72 hours. Recent Labs    08/26/20 0407  NA 139  K 3.7  CL 107  CO2 22  GLUCOSE 92  BUN 18  CREATININE 0.99  CALCIUM 8.9    Intake/Output Summary (Last 24 hours) at 08/27/2020 6010 Last data filed at 08/27/2020 0700 Gross per 24 hour  Intake 540 ml  Output --  Net 540 ml        Physical Exam: Vital Signs Blood pressure 134/76, pulse 71, temperature 97.9 F (36.6 C), resp. rate 20, height 5' 1.42" (1.56 m), weight 114.4 kg, SpO2 98 %. Gen: sitting up in bed, appropriate, NAD HEENT: oral mucosa pink and moist, NCAT Cardio: RRR Chest: CTA B/L- no W/R/R- good air movement Abd: Soft, NT, ND, (+)BS  Not wearing abd binder, since in bed Ext: no edema Musculoskeletal:     Cervical back: Normal range of motion and neck supple.     Comments: Lower extremity edema  Severe TTP middle of posterior thigh- palpable trigger point in muscle. Slightly less TTP  Skin:    General: Skin is warm and dry.     Comments: Incision with dressing CDI  Neurological:     Comments: Mild left facial weakness with ptosis and halting speech at times.--baseline per son.  Motor: RUE: 4 -/5 proximal distally in LUE:/5 proximal RLE: Blood, extension 3 5/5, ankle dorsiflexion 2-5/5 LLE: Hip flexion, knee extension, dorsiflexion 4 -/5  Psychiatric: appropriate- asking appropriate questions   Assessment/Plan: 1. Functional deficits which require 3+ hours per day of interdisciplinary therapy in a comprehensive inpatient rehab setting.  Physiatrist is providing close team supervision and 24 hour management of active  medical problems listed below.  Physiatrist and rehab team continue to assess barriers to discharge/monitor patient progress toward functional and medical goals  Care Tool:  Bathing    Body parts bathed by patient: Right arm,Left arm,Chest,Abdomen,Front perineal area,Buttocks,Right upper leg,Left upper leg,Right lower leg,Left lower leg,Face   Body parts bathed by helper: Left lower leg     Bathing assist Assist Level: Supervision/Verbal cueing     Upper Body Dressing/Undressing Upper body dressing   What is the patient wearing?: Pull over shirt    Upper body assist Assist Level: Independent    Lower Body Dressing/Undressing Lower body dressing      What is the patient wearing?: Pants     Lower body assist Assist for lower body dressing: Contact Guard/Touching assist     Toileting Toileting    Toileting assist Assist for toileting: Minimal Assistance - Patient > 75%     Transfers Chair/bed transfer  Transfers assist     Chair/bed transfer assist level: Contact Guard/Touching assist     Locomotion Ambulation   Ambulation assist      Assist level: Contact Guard/Touching assist Assistive device: Walker-rolling Max distance: 250'   Walk 10 feet activity   Assist     Assist level: Contact Guard/Touching assist Assistive device: Walker-rolling   Walk 50 feet activity   Assist Walk 50 feet with 2 turns activity did not occur: Safety/medical concerns  Assist level: Contact Guard/Touching assist Assistive  device: Walker-rolling    Walk 150 feet activity   Assist Walk 150 feet activity did not occur: Safety/medical concerns  Assist level: Contact Guard/Touching assist Assistive device: Walker-rolling    Walk 10 feet on uneven surface  activity   Assist Walk 10 feet on uneven surfaces activity did not occur: Safety/medical concerns         Wheelchair     Assist Will patient use wheelchair at discharge?: Yes Type of Wheelchair:  Manual    Wheelchair assist level: Supervision/Verbal cueing Max wheelchair distance: 355ft    Wheelchair 50 feet with 2 turns activity    Assist        Assist Level: Supervision/Verbal cueing   Wheelchair 150 feet activity     Assist      Assist Level: Supervision/Verbal cueing   Blood pressure 134/76, pulse 71, temperature 97.9 F (36.6 C), resp. rate 20, height 5' 1.42" (1.56 m), weight 114.4 kg, SpO2 98 %.  Medical Problem List and Plan: 1.  Deficits with mobility, transfers, endurance, self-care secondary to paraparesis.              -patient may shower with incision dressed             -ELOS/Goals: 10-14 days/supervision/mod I  12/16- pt wants ot get home by birthday if possible on 12/26- will see if possible  12/18- had orthostasis for 4 years per pt   12/19- explained likely not going home by birthday if we are going to work on BP issues/weakness- pt voiced understanding  12/24- walked 500 ft yesterday with RW             Continue CIR 2.  Antithrombotics: -DVT/anticoagulation:  Pharmaceutical: Lovenox             -antiplatelet therapy: N/A 3. Pain Management: On IV decadron, robaxin and tylenol prn.              --continue gabapentin 400 mg tid for neuropathy  12/16- has Dilaudid 4 mg q4 hours prn for severe pain- if that's not appropriate, or has allergic reaciton, will try Nucynta- she's had good Sx's relief from that in past.   12/18- will change topamax and gabapentin to all dose at bedtime. Per pt request.   12/19- slept much better with meds at night  12/24- sleeping MUCH better 4. Mood: LCSW to follow for evaluation and support.              -antipsychotic agents: N/A 5. Neuropsych: This patient is capable of making decisions on her own behalf. 6. Skin/Wound Care: Routine pressure relief measures.  7. Fluids/Electrolytes/Nutrition: Monitor I/Os. Discontinue IVF and encourage fluid intake.              12/20: K+ is low- replete with 42meq kdur  today and repeat level tomorrow.   12/21- K+ up to 3.9 today  12/23- K+ 3.7- will recheck on Monday 8. Migraines: Takes Topamax at bedtime. Now constant due to spinal cord injury. Supportive care recommended.  12/16- will try increasing Topamax to 50 mg BID to help with migraine prophylaxis.   12/18- change to bedtime per pt request.  12/19- worked much better- slept well- not sleepy during day as much   12/22- no migraines per pt lately.  9. Constipation: Likely obstipation as has not had BM since admission.             KUB ordered to determine stool burden.  Continue Reglan order enema and augment bowel regimen.   12/16- really constipated- will try Sorbitol today after therapy- if doesn't work, might require Mg citrate.   12/18- LBM documented 12/16- will con't to monitor- might need more sorbitol tomorrow.   12/19- LBM yesterday again- con't sorbitol prn 10. Bradycardia: Question due to spinal injury--HR has in 40's but been as low as 30's.              12/17: HR improved to 51, continue to monitor TID  12/22- HR- 59- doing better 11. Chronic hypotension:  On midodrine tid with florinef per Dr. Rockey Situ.               Orthostatic vital signs ordered  Add abdominal binder and ted hose.              Monitor with increased exertion  12/18- just started Florinef- will do for a few days, then increase.    12/19- BP sitting 110s/60s- con't regimen  12/22- increased Florinef- today- has been 5 days- to 0.2 mg daily. Con't abd binder and ACE wraps- not doing TEDs.   12/24- BP doing MUCH better- no LOC 12. Morbid obesity s/p gastric sleeve: Encourage/educate on weight loss and dietary changes to help promote overall health and mobility.  13. Acute on chronic renal failure: Ultrasound 08/2020 showing medical renal disease. BUN/SCr-26/1.7 on 11/30. Had normal renal function a year ago. No labs since 12/08--BUN/SCr-44/3.40. Has been on IVF for hydration.              CMP ordered for  tomorrow a.m.                      Will also order PVRs to rule out neurogenic bladder as cause.   12/16- Cr down to 1.44- not sure what her baseline is- (1 year ago was 1.05 or so). Will keep off celebrex.  12/17: encouraged hydration  12/18- will recheck labs on Monday.  12/20: Cr much improved to 1.05. Repeat in 1 week.  12/21- Cr down to 0.98-   12/23- Cr 0.99- con't regimen  14. Grief/tearfulness  12/19- will get Neuropsychology to see pt.   12/24- doing much better 15. Anemia: Hgb is 10.3 on 12/20, repeat in 1 week.    16. R posterior thigh pain  12/21- will change robaxin to flexeril 10 mg tid prn  12/22- change flexeril to skelaxin 800 mg TID prn- doesn't cause sedation.   12/23- skelaxin was helpful- con't regimen  12/24- suggest taking Skelaxin during day- doesn't usually cause sedation  LOS: 9 days A FACE TO FACE EVALUATION WAS PERFORMED  Vanessa Medina 08/27/2020, 9:07 AM

## 2020-08-27 NOTE — Progress Notes (Signed)
Physical Therapy Weekly Progress Note  Patient Details  Name: Vanessa Medina MRN: [id number] Date of Birth: 11/28/1970  Beginning of progress report period: August 20, 2020 End of progress report period: August 27, 2020  Today's Date: 08/27/2020    Patient has met 4 of 4 short term goals.  Pt is making great progress towards therapy goals. She is currently at Supervision level for bed mobility, Supervision to CGA for transfers with RW, can perform gait up to 250 ft with use of RW at CGA level, and requires min to mod A for safe stair management. Pt was initially limited in her mobility by syncopal episodes but has had no issues with BP during therapy sessions over the past few days.   Patient continues to demonstrate the following deficits muscle weakness, decreased cardiorespiratoy endurance, decreased coordination and decreased standing balance, decreased postural control and decreased balance strategies and therefore will continue to benefit from skilled PT intervention to increase functional independence with mobility.  Patient progressing toward long term goals..  Continue plan of care.  PT Short Term Goals Week 1:  PT Short Term Goal 1 (Week 1): pt to demonstrate ambulation for 100' CGA with LRAD PT Short Term Goal 1 - Progress (Week 1): Met PT Short Term Goal 2 (Week 1): pt to demonstrate functional transfers with LRAD at CGA PT Short Term Goal 2 - Progress (Week 1): Met PT Short Term Goal 3 (Week 1): pt to demonstrate dynamic standing balance at CGA for at least 5 mins PT Short Term Goal 3 - Progress (Week 1): Met PT Short Term Goal 4 (Week 1): pt to demonstrate supine<>sit CGA with LRAD PT Short Term Goal 4 - Progress (Week 1): Met Week 2:  PT Short Term Goal 2 (Week 2): =LTG due to ELOS    Therapy Documentation Precautions:  Precautions Precautions: Fall Restrictions Weight Bearing Restrictions: No   Therapy/Group: Individual Therapy   Taylor Turkalo, PT,  DPT  08/27/2020, 7:48 AM   

## 2020-08-28 NOTE — Progress Notes (Signed)
[place] PHYSICAL MEDICINE & REHABILITATION PROGRESS NOTE   Subjective/Complaints:  Very busy day, slept on and off last noc, no c/os this am, thirsty but not hungry this am   ROS:  Pt denies SOB, abd pain, CP, N/V/C/D  Objective:   No results found. No results for input(s): WBC, HGB, HCT, PLT in the last 72 hours. Recent Labs    08/26/20 0407  NA 139  K 3.7  CL 107  CO2 22  GLUCOSE 92  BUN 18  CREATININE 0.99  CALCIUM 8.9    Intake/Output Summary (Last 24 hours) at 08/28/2020 0710 Last data filed at 08/27/2020 2200 Gross per 24 hour  Intake 720 ml  Output --  Net 720 ml        Physical Exam: Vital Signs Blood pressure 117/71, pulse 77, temperature 98.2 F (36.8 C), resp. rate 16, height 5' 1.42" (1.56 m), weight 114.4 kg, SpO2 97 %.  General: No acute distress Mood and affect are appropriate Heart: Regular rate and rhythm no rubs murmurs or extra sounds Lungs: Clear to auscultation, breathing unlabored, no rales or wheezes Abdomen: Positive bowel sounds, soft nontender to palpation, nondistended Extremities: No clubbing, cyanosis, or edema   Musculoskeletal:     Cervical back: Normal range of motion and neck supple.     Comments: Lower extremity edema  Severe TTP middle of posterior thigh- palpable trigger point in muscle. Slightly less TTP  Skin:    General: Skin is warm and dry.     Comments: Incision with dressing CDI  Neurological:     Comments: Mild left facial weakness with ptosis and halting speech at times.--baseline per son.  Motor: RUE: 4 -/5 proximal distally in LUE:/5 proximal RLE: Blood, extension 3 5/5, ankle dorsiflexion 2-5/5 LLE: Hip flexion, knee extension, dorsiflexion 4 -/5  Psychiatric: appropriate   Assessment/Plan: 1. Functional deficits which require 3+ hours per day of interdisciplinary therapy in a comprehensive inpatient rehab setting.  Physiatrist is providing close team supervision and 24 hour management of active  medical problems listed below.  Physiatrist and rehab team continue to assess barriers to discharge/monitor patient progress toward functional and medical goals  Care Tool:  Bathing    Body parts bathed by patient: Right arm,Left arm,Chest,Abdomen,Front perineal area,Buttocks,Right upper leg,Left upper leg,Right lower leg,Left lower leg,Face   Body parts bathed by helper: Left lower leg     Bathing assist Assist Level: Supervision/Verbal cueing     Upper Body Dressing/Undressing Upper body dressing   What is the patient wearing?: Pull over shirt    Upper body assist Assist Level: Independent    Lower Body Dressing/Undressing Lower body dressing      What is the patient wearing?: Pants     Lower body assist Assist for lower body dressing: Supervision/Verbal cueing     Toileting Toileting    Toileting assist Assist for toileting: Minimal Assistance - Patient > 75%     Transfers Chair/bed transfer  Transfers assist     Chair/bed transfer assist level: Supervision/Verbal cueing     Locomotion Ambulation   Ambulation assist      Assist level: Supervision/Verbal cueing Assistive device: Walker-rolling Max distance: 150'   Walk 10 feet activity   Assist     Assist level: Supervision/Verbal cueing Assistive device: Walker-rolling   Walk 50 feet activity   Assist Walk 50 feet with 2 turns activity did not occur: Safety/medical concerns  Assist level: Supervision/Verbal cueing Assistive device: Walker-rolling    Walk 150 feet  activity   Assist Walk 150 feet activity did not occur: Safety/medical concerns  Assist level: Supervision/Verbal cueing Assistive device: Walker-rolling    Walk 10 feet on uneven surface  activity   Assist Walk 10 feet on uneven surfaces activity did not occur: Safety/medical concerns         Wheelchair     Assist Will patient use wheelchair at discharge?: Yes Type of Wheelchair: Manual    Wheelchair  assist level: Supervision/Verbal cueing Max wheelchair distance: 311ft    Wheelchair 50 feet with 2 turns activity    Assist        Assist Level: Supervision/Verbal cueing   Wheelchair 150 feet activity     Assist      Assist Level: Supervision/Verbal cueing   Blood pressure 117/71, pulse 77, temperature 98.2 F (36.8 C), resp. rate 16, height 5' 1.42" (1.56 m), weight 114.4 kg, SpO2 97 %.  Medical Problem List and Plan: 1.  Deficits with mobility, transfers, endurance, self-care secondary to paraparesis.              -patient may shower with incision dressed             -ELOS/Goals: 12/28 /supervision/mod I              Continue CIR PT, OT 2.  Antithrombotics: -DVT/anticoagulation:  Pharmaceutical: Lovenox             -antiplatelet therapy: N/A 3. Pain Management: On IV decadron, robaxin and tylenol prn.              --continue gabapentin 400 mg tid for neuropathy  12/16- has Dilaudid 4 mg q4 hours prn for severe pain- if that's not appropriate, or has allergic reaciton, will try Nucynta- she's had good Sx's relief from that in past.   12/18- will change topamax and gabapentin to all dose at bedtime. Per pt request.   12/19- slept much better with meds at night 4. Mood: LCSW to follow for evaluation and support.              -antipsychotic agents: N/A 5. Neuropsych: This patient is capable of making decisions on her own behalf. 6. Skin/Wound Care: Routine pressure relief measures.  7. Fluids/Electrolytes/Nutrition: Monitor I/Os. Discontinue IVF and encourage fluid intake.              12/20: K+ is low- replete with 60meq kdur today and repeat level tomorrow.   12/21- K+ up to 3.9 today  12/23- K+ 3.7- will recheck on Monday 8. Migraines: Takes Topamax at bedtime. Now constant due to spinal cord injury. Supportive care recommended.  12/16- will try increasing Topamax to 50 mg BID to help with migraine prophylaxis.   12/18- change to bedtime per pt  request.  12/19- worked much better- slept well- not sleepy during day as much   12/22- no migraines per pt lately.  9. Constipation: Likely obstipation as has not had BM since admission.             KUB ordered to determine stool burden.               Continue Reglan order enema and augment bowel regimen.   12/16- really constipated- will try Sorbitol today after therapy- if doesn't work, might require Mg citrate.   12/18- LBM documented 12/16- will con't to monitor- might need more sorbitol tomorrow.   12/19- LBM yesterday again- con't sorbitol prn 10. Bradycardia: Question due to spinal injury--HR has in 40's but  been as low as 30's.              12/17: HR improved to 51, continue to monitor TID  12/22- HR- 59- doing better 11. Chronic hypotension:  On midodrine tid with florinef per Dr. Rockey Situ.               Orthostatic vital signs ordered  Add abdominal binder and ted hose.              Monitor with increased exertion  12/18- just started Florinef- will do for a few days, then increase.    12/19- BP sitting 110s/60s- con't regimen  12/22- increased Florinef- today- has been 5 days- to 0.2 mg daily. Con't abd binder and ACE wraps- not doing TEDs.   12/23- BP doing better- con't regimen 12. Morbid obesity s/p gastric sleeve: Encourage/educate on weight loss and dietary changes to help promote overall health and mobility.  13. Acute on chronic renal failure: Ultrasound 08/2020 showing medical renal disease. BUN/SCr-26/1.7 on 11/30. Had normal renal function a year ago. No labs since 12/08--BUN/SCr-44/3.40. Has been on IVF for hydration.              CMP ordered for tomorrow a.m.                      Will also order PVRs to rule out neurogenic bladder as cause.   12/16- Cr down to 1.44- not sure what her baseline is- (1 year ago was 1.05 or so). Will keep off celebrex.  12/17: encouraged hydration  12/18- will recheck labs on Monday.  12/20: Cr much improved to 1.05. Repeat in 1  week.  12/21- Cr down to 0.98-   12/23- Cr 0.99- con't regimen  14. Grief/tearfulness  12/19- will get Neuropsychology to see pt.  15. Anemia: Hgb is 10.3 on 12/20, repeat in 1 week.    16. R posterior thigh pain- improved  12/21- will change robaxin to flexeril 10 mg tid prn  12/22- change flexeril to skelaxin 800 mg TID prn- doesn't cause sedation.   12/23- skelaxin was helpful- con't regimen  LOS: 10 days A FACE TO FACE EVALUATION WAS PERFORMED  Charlett Blake 08/28/2020, 7:10 AM

## 2020-08-29 ENCOUNTER — Inpatient Hospital Stay (HOSPITAL_COMMUNITY): Payer: 59

## 2020-08-29 ENCOUNTER — Ambulatory Visit (HOSPITAL_COMMUNITY): Payer: 59 | Admitting: Physical Therapy

## 2020-08-29 NOTE — Progress Notes (Signed)
Occupational Therapy Session Note  Patient Details  Name: Vanessa Medina MRN: [id number] Date of Birth: 1971-02-04  Today's Date: 08/29/2020 OT Individual Time: 1000-1100 OT Individual Time Calculation (min): 60 min    Short Term Goals: Week 2:  OT Short Term Goal 1 (Week 2): STG=LTG 2/2 ELOS (Supervision overall)  Skilled Therapeutic Interventions/Progress Updates:    OT intervention with focus on functional amb with RW, bathing at shower level, dressing with sit<>stand from w/c, standing balance, and discharge planning. Pt purchased compression stockings and is now able to doff/don without assistance. Pt also purchased compression shirts which she wears under clothing. Pt ordered compression belt but she states it is too small until she loses more weight. Pt completed all tasks with supervision/CGA. No s/s of orthostatic BP. See Care Tool for assist levels. Pt pleased with progress and is looking forward to discharge on 12/28. Pt remained seated in w/c with all needs within reach.   Therapy Documentation Precautions:  Precautions Precautions: Fall Restrictions Weight Bearing Restrictions: No   Pain: Pain Assessment Pain Scale: 0-10 Pain Score: 0-No pain   Therapy/Group: Individual Therapy  Leroy Libman 08/29/2020, 12:08 PM

## 2020-08-29 NOTE — Progress Notes (Signed)
Physical Therapy Session Note  Patient Details  Name: Vanessa Medina MRN: [id number] Date of Birth: [birth year]  Today's Date: 08/29/2020 PT Individual Time: 0908-1005 PT Individual Time Calculation (min): 57 min   Short Term Goals: Week 2:  PT Short Term Goal 2 (Week 2): =LTG due to ELOS  Skilled Therapeutic Interventions/Progress Updates:    Pt received sitting in w/c and agreeable to therapy session. Reports that her family was not able to make it to this session for famed today because her son, who is a Airline pilot, was called out to a job. Pt reports they had no questions/concerns after training on 08/27/20. Donned personal B LE knee high TED hose and shoes with set-up assist. Sit<>stands using RW with supervision for safety throughout session - demos proper UE placement using AD without cuing. Gait training ~110ft x2 to/from main therapy gym using RW with close supervision for safety (therapist bringing w/c in event of BP changes but none occurred during session) - demos very slow gait speed but reciprocal gait pattern. Pt reports her main concern is stair navigation. Ascended/descended 4 steps x2 using R HR only to replicate home entry via lateral step-to pattern with therapist providing CGA for safety - cuing for increased R LE hip abduction on descent to allow space for L foot on step with good carryover. Discussed energy conservation strategies as pt reports stair navigation was much more successful today compared to previous sessions at the end of her day when she is fatigued. Participated in 1 set of the below exercises and created pt's HEP - pt reports these were very fatiguing with most difficulty noted during sit<>stands trying not to use UE support. Therapist educated pt on safe set-up of exercises at home. At end of session, during gait training back to room, pt reports that she is interested in trying a rollator for community mobility - will notify next therapist. At end of session  pt left seated in w/c with needs in reach.   Access Code: [email or web address] URL: [email or web address] Date: 08/29/2020 Prepared by: Page Spiro  Exercises Sit to Stand with Arms Crossed - 1 x daily - 7 x weekly - 2 sets - 8 reps Heel rises with counter support - 1 x daily - 7 x weekly - 2 sets - 12 reps Standing Hip Abduction with Counter Support - 1 x daily - 7 x weekly - 2 sets - 12 reps Side Stepping with Counter Support - 1 x daily - 7 x weekly - 2 sets - 3 reps   Therapy Documentation Precautions:  Precautions Precautions: Fall Restrictions Weight Bearing Restrictions: No  Pain:   Pt discusses her hx of low back pain but no reports of current pain during session.   Therapy/Group: Individual Therapy  Tawana Scale , PT, DPT, CSRS  08/29/2020, 7:47 AM

## 2020-08-30 ENCOUNTER — Inpatient Hospital Stay (HOSPITAL_COMMUNITY): Payer: 59

## 2020-08-30 ENCOUNTER — Ambulatory Visit: Payer: 59 | Admitting: Specialist

## 2020-08-30 DIAGNOSIS — G903 Multi-system degeneration of the autonomic nervous system: Secondary | ICD-10-CM

## 2020-08-30 DIAGNOSIS — I950 Idiopathic hypotension: Secondary | ICD-10-CM

## 2020-08-30 DIAGNOSIS — R001 Bradycardia, unspecified: Secondary | ICD-10-CM

## 2020-08-30 DIAGNOSIS — E876 Hypokalemia: Secondary | ICD-10-CM

## 2020-08-30 LAB — CBC
HCT: 28 % — ABNORMAL LOW (ref 36.0–46.0)
Hemoglobin: 9.3 g/dL — ABNORMAL LOW (ref 12.0–15.0)
MCH: 33.3 pg (ref 26.0–34.0)
MCHC: 33.2 g/dL (ref 30.0–36.0)
MCV: 100.4 fL — ABNORMAL HIGH (ref 80.0–100.0)
Platelets: 202 10*3/uL (ref 150–400)
RBC: 2.79 MIL/uL — ABNORMAL LOW (ref 3.87–5.11)
RDW: 13.2 % (ref 11.5–15.5)
WBC: 4.4 10*3/uL (ref 4.0–10.5)
nRBC: 0 % (ref 0.0–0.2)

## 2020-08-30 LAB — BASIC METABOLIC PANEL
Anion gap: 10 (ref 5–15)
BUN: 13 mg/dL (ref 6–20)
CO2: 20 mmol/L — ABNORMAL LOW (ref 22–32)
Calcium: 8.8 mg/dL — ABNORMAL LOW (ref 8.9–10.3)
Chloride: 109 mmol/L (ref 98–111)
Creatinine, Ser: 0.95 mg/dL (ref 0.44–1.00)
GFR, Estimated: >60 mL/min (ref >60)
Glucose, Bld: 92 mg/dL (ref 70–99)
Potassium: 3.6 mmol/L (ref 3.5–5.1)
Sodium: 139 mmol/L (ref 135–145)

## 2020-08-30 MED ORDER — HYDROMORPHONE HCL 2 MG PO TABS
4.0000 mg | ORAL_TABLET | Freq: Four times a day (QID) | ORAL | Status: DC | PRN
  Administered 2020-08-30: 4 mg via ORAL
  Filled 2020-08-30: qty 2

## 2020-08-30 MED ORDER — METOCLOPRAMIDE HCL 5 MG PO TABS
5.0000 mg | ORAL_TABLET | Freq: Three times a day (TID) | ORAL | Status: DC
  Filled 2020-08-30: qty 1

## 2020-08-30 MED ORDER — METOCLOPRAMIDE HCL 5 MG PO TABS
5.0000 mg | ORAL_TABLET | Freq: Three times a day (TID) | ORAL | Status: DC
  Administered 2020-08-30 – 2020-08-31 (×2): 5 mg via ORAL
  Filled 2020-08-30: qty 1

## 2020-08-30 NOTE — Progress Notes (Signed)
[name] PHYSICAL MEDICINE & REHABILITATION PROGRESS NOTE   Subjective/Complaints: Patient seen sitting up in her chair this morning, working with therapies.  She states she slept fairly well overnight.  She has questions regarding discharge medications and discharge therapies.  ROS: Denies CP, SOB, N/V/D  Objective:   No results found. Recent Labs    08/30/20 0459  WBC 4.4  HGB 9.3*  HCT 28.0*  PLT 202   Recent Labs    08/30/20 0459  NA 139  K 3.6  CL 109  CO2 20*  GLUCOSE 92  BUN 13  CREATININE 0.95  CALCIUM 8.8*    Intake/Output Summary (Last 24 hours) at 08/30/2020 1234 Last data filed at 08/30/2020 0700 Gross per 24 hour  Intake 600 ml  Output --  Net 600 ml        Physical Exam: Vital Signs Blood pressure 117/67, pulse 73, temperature 98.2 F (36.8 C), resp. rate 18, height 5' 1.42" (1.56 m), weight 114.4 kg, SpO2 97 %. Constitutional: No distress . Vital signs reviewed. HENT: Normocephalic.  Atraumatic. Eyes: EOMI. No discharge. Cardiovascular: No JVD.  RRR. Respiratory: Normal effort.  No stridor.  Bilateral clear to auscultation. GI: Non-distended.  BS +. Skin: Warm and dry.  Intact. Psych: Normal mood.  Normal behavior. Musc: No edema in extremities.  No tenderness in extremities. Neuro: Alert Motor: Bilateral upper extremities: 4/5 proximal distal RLE: Hip flexion, knee extension 4 - /5, ankle dorsiflexion 4-/5 LLE: Hip flexion, knee extension, dorsiflexion 4/5  Assessment/Plan: 1. Functional deficits which require 3+ hours per day of interdisciplinary therapy in a comprehensive inpatient rehab setting.  Physiatrist is providing close team supervision and 24 hour management of active medical problems listed below.  Physiatrist and rehab team continue to assess barriers to discharge/monitor patient progress toward functional and medical goals  Care Tool:  Bathing    Body parts bathed by patient: Right arm,Left  arm,Chest,Abdomen,Front perineal area,Buttocks,Right upper leg,Left upper leg,Right lower leg,Left lower leg,Face   Body parts bathed by helper: Left lower leg     Bathing assist Assist Level: Supervision/Verbal cueing     Upper Body Dressing/Undressing Upper body dressing   What is the patient wearing?: Pull over shirt    Upper body assist Assist Level: Independent    Lower Body Dressing/Undressing Lower body dressing      What is the patient wearing?: Pants     Lower body assist Assist for lower body dressing: Independent with assitive device     Toileting Toileting    Toileting assist Assist for toileting: Independent with assistive device     Transfers Chair/bed transfer  Transfers assist     Chair/bed transfer assist level: Supervision/Verbal cueing Chair/bed transfer assistive device: Programmer, multimedia   Ambulation assist      Assist level: Supervision/Verbal cueing Assistive device: Walker-rolling Max distance: 145ft   Walk 10 feet activity   Assist     Assist level: Supervision/Verbal cueing Assistive device: Walker-rolling   Walk 50 feet activity   Assist Walk 50 feet with 2 turns activity did not occur: Safety/medical concerns  Assist level: Supervision/Verbal cueing Assistive device: Walker-rolling    Walk 150 feet activity   Assist Walk 150 feet activity did not occur: Safety/medical concerns  Assist level: Supervision/Verbal cueing Assistive device: Walker-rolling    Walk 10 feet on uneven surface  activity   Assist Walk 10 feet on uneven surfaces activity did not occur: Safety/medical concerns   Assist level: Supervision/Verbal cueing Assistive  device: Aeronautical engineer Will patient use wheelchair at discharge?: Yes Type of Wheelchair: Manual    Wheelchair assist level: Supervision/Verbal cueing Max wheelchair distance: 38ft    Wheelchair 50 feet with 2 turns  activity    Assist        Assist Level: Supervision/Verbal cueing   Wheelchair 150 feet activity     Assist      Assist Level: Supervision/Verbal cueing   Blood pressure 117/67, pulse 73, temperature 98.2 F (36.8 C), resp. rate 18, height 5' 1.42" (1.56 m), weight 114.4 kg, SpO2 97 %.  Medical Problem List and Plan: 1.  Deficits with mobility, transfers, endurance, self-care secondary to paraparesis.             Continue CIR 2.  Antithrombotics: -DVT/anticoagulation:  Pharmaceutical: Lovenox             -antiplatelet therapy: N/A 3. Pain Management: On IV decadron, robaxin and tylenol prn.              Dilaudid 4 mg q4 hours prn for severe pain  Changed topamax and gabapentin to all dose at bedtime. Per pt request.   Controlled with meds on 12/27 4. Mood: LCSW to follow for evaluation and support.              -antipsychotic agents: N/A 5. Neuropsych: This patient is capable of making decisions on her own behalf. 6. Skin/Wound Care: Routine pressure relief measures.  7. Fluids/Electrolytes/Nutrition: Monitor I/Os. Discontinue IVF and encourage fluid intake.  8. Migraines: Takes Topamax at bedtime. Now constant due to spinal cord injury. Supportive care recommended.  Controlled with meds on 12/27 9. Constipation: Likely obstipation as has not had BM since admission.             Continue Reglan order enema and augment bowel regimen.   Sorbitol prn 10. Bradycardia: Question due to spinal injury  Relatively controlled on 12/27  11. Chronic hypotension:  On midodrine tid with florinef per Dr. Rockey Situ.     Add abdominal binder and ted hose.              Monitor with increased exertion  Controlled on 12/27 12. Morbid obesity s/p gastric sleeve: Encourage/educate on weight loss and dietary changes to help promote overall health and mobility.  13. Acute on chronic renal failure: Resolved   Ultrasound 08/2020 showing medical renal disease. BUN/SCr-26/1.7 on 11/30. Had  normal renal function a year ago  Creatinine 0.95 on 12/27  14. Anemia:   Hemoglobin 9.3 on 12/27, continue to monitor 15. R posterior thigh pain- improved  Skelaxin 800 mg TID prn- doesn't cause sedation.   Controlled on 12/27 17.  Hypokalemia  Supplemented  Potassium 3.6 on 12/27  LOS: 12 days A FACE TO FACE EVALUATION WAS PERFORMED  Vanessa Medina Vanessa Medina 08/30/2020, 12:34 PM

## 2020-08-30 NOTE — Progress Notes (Signed)
Physical Therapy Session Note  Patient Details  Name: Vanessa Medina MRN: [id number] Date of Birth: Jun 23, 1971  Today's Date: 08/30/2020 PT Individual Time: 0102-7253 PT Individual Time Calculation (min): 71 min   Short Term Goals: Week 1:  PT Short Term Goal 1 (Week 1): pt to demonstrate ambulation for 100' CGA with LRAD PT Short Term Goal 1 - Progress (Week 1): Met PT Short Term Goal 2 (Week 1): pt to demonstrate functional transfers with LRAD at [facility] PT Short Term Goal 2 - Progress (Week 1): Met PT Short Term Goal 3 (Week 1): pt to demonstrate dynamic standing balance at CGA for at least 5 mins PT Short Term Goal 3 - Progress (Week 1): Met PT Short Term Goal 4 (Week 1): pt to demonstrate supine<>sit CGA with LRAD PT Short Term Goal 4 - Progress (Week 1): Met Week 2:  PT Short Term Goal 2 (Week 2): =LTG due to ELOS  Skilled Therapeutic Interventions/Progress Updates:   Received pt sitting in recliner, pt agreeable to therapy, and denied any pain during session. Session with emphasis on discharge planning, functional mobility/transfers, generalized strengthening, dynamic standing balance/coordinaiton, ambulation, simulated car transfers, and improved activity tolerance. Pt ambulated 66f with RW and supervision to bed and performed all bed mobility mod I using bedrails and increased time while maintaining back precautions; pt reports she has a bedrail at home. MD present for morning rounds to address pt's questions. Pt ambulated 1044fwith RW and supervision to ortho gym and performed ambulatory simulated car transfer with RW and supervision and ambulated 1037fn uneven surfaces (ramp) with RW and supervision. Pt able to pick up small cup from ground with RW and CGA bending at the knees with no LOB noted. Pt ambulated additional 100f13fth RW and supervision and then requested to ambulate with rollator; as she has two at home. Pt ambulated additional 150ft7fh rollator and supervision to  dayroom. CSW present during session to discuss OPPT. Pt performed BUE/LE strengthening on Nustep at workload 5 for 10 minutes for a total of 313 steps for improved cardiovascular endurance. Pt then performed standing LE strengthening on Kinetron at 20 cm/sec for 2x20 reps with BUE support and CGA for balance. Pt ambulated >350ft 21f rollator and supervision back to room. Therapist educated pt on safety of rollator with emphasis on brakes management and educated pt on importance of backing rollator up against wall/door for safety when sitting to avoid rollator sliding; pt verbalized understanding and thankful for education. Concluded session with pt sitting in recliner with all needs within reach.   Therapy Documentation Precautions:  Precautions Precautions: Fall Restrictions Weight Bearing Restrictions: No   Therapy/Group: Individual Therapy Akari Defelice MAlfonse AlpersPT   08/30/2020, 7:36 AM

## 2020-08-30 NOTE — Progress Notes (Signed)
Occupational Therapy Session Note  Patient Details  Name: Vanessa Medina MRN: [id number] Date of Birth: August 23, 1971  Today's Date: 08/30/2020 OT Individual Time: 0277-4128 OT Individual Time Calculation (min): 60 min    Short Term Goals: Week 2:  OT Short Term Goal 1 (Week 2): STG=LTG 2/2 ELOS (Supervision overall)  Skilled Therapeutic Interventions/Progress Updates:    Pt resting in recliner upon arrival. OT intervention with focus on standing balance, functional amb with RW, functional transfers, bathing at shower level, dressing with sit<>stand from w/c, and safety awareness in preparation for discharge home tomorrow. Pt amb with RW in room to gather clothing before entering bathroom for shower. See Care Tool for specific assist levels. Pt supervision/mod I overall for all BADLs. Pt amb with RW to gym and engaged in standing activity with rebounder on noncompliant and compliant surface. Pt required CGA. No LOB noted. Pt returned to room and remained in recliner with all needs within reach.   Therapy Documentation Precautions:  Precautions Precautions: Fall Precaution Comments: watch BP Restrictions Weight Bearing Restrictions: No   Pain:  Pt denies pain this morning   Therapy/Group: Individual Therapy  Leroy Libman 08/30/2020, 11:42 AM

## 2020-08-30 NOTE — Progress Notes (Shared)
Occupational Therapy Discharge Summary  Patient Details  Name: Vanessa Medina MRN: [id number] Date of Birth: [birth year]  Patient has met 10 of 10 long term goals due to {due to:3041651}.  Pt made excellent progress with BADLs and functional transfers during this admission. Toilet transfers and toileting at [place] transfers with supervision. Bathing with supervision. Dressing with mod I. Pt's son has been present for education during therapy and provides the appropriate level of supervision. Patient to discharge at overall {LOA:3049010} level.  Patient's care partner {care partner:3041650} to provide the necessary {assistance:3041652} assistance at discharge.    Recommendation:  Patient will benefit from ongoing skilled OT services in {setting:3041680} to continue to advance functional skills in the area of {ADL/iADL:3041649}.  Equipment: No equipment provided  Reasons for discharge: {Reason for discharge:3049018}  Patient/family agrees with progress made and goals achieved: {Pt/Family agree with progress/goals:3049020}  OT Discharge Vision Baseline Vision/History: No visual deficits Patient Visual Report: No change from baseline Vision Assessment?: No apparent visual deficits Perception  Perception: Within Functional Limits Praxis Praxis: Intact Cognition Overall Cognitive Status: Within Functional Limits for tasks assessed Arousal/Alertness: Awake/alert Orientation Level: Oriented X4 Attention: Sustained;Focused Focused Attention: Appears intact Sustained Attention: Appears intact Memory: Appears intact Awareness: Appears intact Problem Solving: Appears intact Safety/Judgment: Appears intact Sensation Sensation Light Touch: Impaired Detail Light Touch Impaired Details: Impaired RLE Coordination Gross Motor Movements are Fluid and Coordinated: Yes Fine Motor Movements are Fluid and Coordinated: Yes Motor  Motor Motor: Within Functional  Limits Trunk/Postural Assessment  Cervical Assessment Cervical Assessment: Within Functional Limits Thoracic Assessment Thoracic Assessment: Within Functional Limits Lumbar Assessment Lumbar Assessment: Within Functional Limits  Balance Static Sitting Balance Static Sitting - Balance Support: Feet supported Static Sitting - Level of Assistance: 7: Independent Dynamic Sitting Balance Dynamic Sitting - Balance Support: During functional activity Dynamic Sitting - Level of Assistance: 6: Modified independent (Device/Increase time) Extremity/Trunk Assessment RUE Assessment RUE Assessment: Within Functional Limits LUE Assessment LUE Assessment: Within Functional Limits   Leroy Libman 08/30/2020, 6:13 AM

## 2020-08-30 NOTE — Progress Notes (Signed)
Patient ID: Vanessa Medina, female   DOB: 09-Mar-1971, 49 y.o.   MRN: [id number]  SW met with pt to discuss d/c recommendations for outpatient PT/OT and follow-up about family edu with son. Reports preferred outpatient location is  [facility] ([phone]/f:[phone]). Pt states no questions after family edu completed on Friday. Pt requested a letter to support to her employer that she has been here in the hospital and how long she is anticipated to be out of work. SW informed can provide generic letter to support hospitalization, and will ask physician on letter for how long she will be out of work.   SW faxed referral to [place]. SW provided pt with letter to support she was here in the hospital. SW informed attending about letter needed.   Loralee Pacas, MSW, [place] Office: [phone] Cell: [phone] Fax: [phone]

## 2020-08-30 NOTE — Progress Notes (Signed)
Occupational Therapy Session Note  Patient Details  Name: Vanessa Medina MRN: [id number] Date of Birth: 1970-09-08  Today's Date: 08/30/2020 OT Individual Time: 1345-1425 OT Individual Time Calculation (min): 40 min    Short Term Goals: Week 2:  OT Short Term Goal 1 (Week 2): STG=LTG 2/2 ELOS (Supervision overall)  Skilled Therapeutic Interventions/Progress Updates:    OT intervention with focus on functional amb with Rollator, standing balance while engaged in task, activity tolerance, and safety awareness to prepare for discharge home tomorrow. Pt amb with Rollator from room to Day Room without rest break. After short break, pt engaged in Wii bowling, standing for the complete game competing against therapist. Pt used LUE for slight support when stepping through to release ball. Pt also engaged in game of Wii tennis. No LOB noted. Pt amb with Rollator back to room and returned to recliner. Pt remained in recliner with all needs within reach.  Therapy Documentation Precautions:  Precautions Precautions: Fall Precaution Comments: watch BP Restrictions Weight Bearing Restrictions: No   Pain:  Pt denies pain this afternoon   Therapy/Group: Individual Therapy  Leroy Libman 08/30/2020, 2:37 PM

## 2020-08-31 DIAGNOSIS — R0989 Other specified symptoms and signs involving the circulatory and respiratory systems: Secondary | ICD-10-CM

## 2020-08-31 DIAGNOSIS — G43709 Chronic migraine without aura, not intractable, without status migrainosus: Secondary | ICD-10-CM

## 2020-08-31 MED ORDER — HYDROMORPHONE HCL 4 MG PO TABS: 4.0000 mg | ORAL_TABLET | Freq: Three times a day (TID) | ORAL | 0 refills | Status: DC | PRN

## 2020-08-31 MED ORDER — FLUDROCORTISONE ACETATE 0.1 MG PO TABS: 0.2000 mg | ORAL_TABLET | Freq: Every day | ORAL | 0 refills | Status: DC

## 2020-08-31 MED ORDER — METOCLOPRAMIDE HCL 5 MG PO TABS: 5.0000 mg | ORAL_TABLET | Freq: Three times a day (TID) | ORAL | 0 refills | Status: DC

## 2020-08-31 MED ORDER — GABAPENTIN 400 MG PO CAPS: 1200.0000 mg | ORAL_CAPSULE | Freq: Every day | ORAL | 0 refills | Status: DC

## 2020-08-31 MED ORDER — POLYETHYLENE GLYCOL 3350 17 G PO PACK: 17.0000 g | PACK | Freq: Every day | ORAL | 0 refills | Status: DC

## 2020-08-31 MED ORDER — PANTOPRAZOLE SODIUM 20 MG PO TBEC: 20.0000 mg | DELAYED_RELEASE_TABLET | Freq: Every day | ORAL | 0 refills | Status: DC

## 2020-08-31 MED ORDER — ADULT MULTIVITAMIN W/MINERALS CH: 1.0000 | ORAL_TABLET | Freq: Two times a day (BID) | ORAL | Status: AC

## 2020-08-31 MED ORDER — TRAZODONE HCL 50 MG PO TABS: 25.0000 mg | ORAL_TABLET | Freq: Every evening | ORAL | 0 refills | Status: DC | PRN

## 2020-08-31 MED ORDER — METAXALONE 800 MG PO TABS: 800.0000 mg | ORAL_TABLET | Freq: Three times a day (TID) | ORAL | 0 refills | Status: DC | PRN

## 2020-08-31 MED ORDER — TOPIRAMATE 100 MG PO TABS: 100.0000 mg | ORAL_TABLET | Freq: Every day | ORAL | 0 refills | Status: DC

## 2020-08-31 NOTE — Discharge Summary (Signed)
Physician Discharge Summary  Patient ID: Vanessa Medina MRN: [id number] DOB/AGE: 11-03-1970 49 y.o.  Admit date: 08/18/2020 Discharge date: 08/31/2020  Discharge Diagnoses:  Principal Problem:   Paraparesis [facility]) Active Problems:   Spinal cord injury, lumbar, without spinal bone injury, sequela ([place])   Adjustment reaction with anxiety and depression   Hypokalemia   Chronic migraine without aura without status migrainosus, not intractable   Discharged Condition: stable   Significant Diagnostic Studies: DG Abd 1 View  Result Date: 08/18/2020 CLINICAL DATA:  Constipation. EXAM: ABDOMEN - 1 VIEW COMPARISON:  None. FINDINGS: The colon is distended with air and stool. There is no small bowel dilatation. No evidence of free air in the supine views. Surgical clips in the right upper quadrant from cholecystectomy. Spinal fusion hardware in place. Left hip arthroplasty. IMPRESSION: Distended colon with air and stool, suggesting constipation. No obstruction or small bowel distension. Electronically Signed   By: Keith Rake M.D.   On: 08/18/2020 19:36    Labs:  Basic Metabolic Panel: BMP Latest Ref Rng & Units 08/30/2020 08/26/2020 08/24/2020  Glucose 70 - 99 mg/dL 92 92 83  BUN 6 - 20 mg/dL 13 18 23(H)  Creatinine 0.44 - 1.00 mg/dL 0.95 0.99 0.98  Sodium 135 - 145 mmol/L 139 139 139  Potassium 3.5 - 5.1 mmol/L 3.6 3.7 3.9  Chloride 98 - 111 mmol/L 109 107 109  CO2 22 - 32 mmol/L 20(L) 22 20(L)  Calcium 8.9 - 10.3 mg/dL 8.8(L) 8.9 9.3    CBC: CBC Latest Ref Rng & Units 08/30/2020 08/23/2020 08/19/2020  WBC 4.0 - 10.5 K/uL 4.4 7.4 8.6  Hemoglobin 12.0 - 15.0 g/dL 9.3(L) 10.3(L) 10.8(L)  Hematocrit 36.0 - 46.0 % 28.0(L) 32.1(L) 32.8(L)  Platelets 150 - 400 K/uL 202 170 164    CBG: No results for input(s): GLUCAP in the last 168 hours.  Brief HPI:   Vanessa Medina is a 49 y.o. female with history of HTN, migraines, morbid obesity, chronic hypertension, multiple back  surgeries chronic back pain and LLE lumbar radiculopathy who was scheduled to have spinal cord stimulator placed on 08/11/2020. Dr. Davy Pique was unable to perform surgery due to scar tissue however upon wakening patient had excruciating pain RLE with plegia and allodynia. She was admitted to [facility] from surgical center for work-up by Dr. Ronnald Ramp and thoracic MRI done revealing subtle increase T2/STIR intensity distal cord at T12-L1 level suspicious for edema/acute spinal cord injury. No CSF leak noted and prior PLIF L3-L5 noted with residual foraminal protrusion L3/4 potentially irritating right L3 nerve. She was treated with IV Decadron, started on IV Dilaudid as well as gabapentin for pain management. She has had issues with headaches with severe neuropathy, RLE weakness with pain, constipation with nausea, epigastric pain as well as weakness affecting overall functional status. Therapy was ongoing and CIR was recommended due to functional decline.    Hospital Course: Vanessa Medina was admitted to rehab 08/18/2020 for inpatient therapies to consist of PT and OT at least three hours five days a week. Past admission physiatrist, therapy team and rehab RN have worked together to provide customized collaborative inpatient rehab. Pain continues to be an issue initially. Topamax was increased to 100 mg to help with constant headaches. Gabapentin and Topamax were scheduled at bedtime per patient request to help with pain management. Team has also provided ego support during his stay. As her endurance and activity tolerance has improved, her dilaudid use has decreased to once to twice a  day on as needed basis. She was noted to have severe constipation at admission. Bowel program was augmented and Reglan was ordered to help with GI symptoms. Her p.o. intake has greatly improved with renal function back to baseline.   Back incision is C/D/I and has been healing well and staples were removed on 12/20.  Follow-up CBC shows  drop in H&H question due to hemodilution with improvement in renal status. Hypokalemia has resolved with brief supplementation and increase in intake. Her blood pressures were monitored on 3 times daily basis and due to ongoing issues with orthostatic changes Florinef was increased to 0.2 mg  TID. Abdominal binder and teds were also ordered for support. She has been educated extensively about importance of increasing fluid intake as well as consistent meals especially in light of her gastric surgery.  Per patient request, she has been referred to Dr. Einar Gip for second opinion and question of POTS. She has had improvement in RLE strength, pain control as well as functional status. Her orthostatic symptoms have resolved however due to ongoing issues with low blood pressures, supervision is recommended for safety post discharge. She will continue to receive follow-up outpatient PT and OT at [facility] after discharge.  Rehab course: During patient's stay in rehab weekly team conferences were held to monitor patient's progress, set goals and discuss barriers to discharge. At admission, patient required min to mod assist with ADL tasks and min assist with mobility.  She  has had improvement in activity tolerance, balance, postural control as well as ability to compensate for deficits. She is able to dress at modified independent level and requires supervision with bathing and tub/toilet transfers. She is modified independent for transfers and is able to ambulate 100' with supervision. Family education was completed regarding all aspects of care and safety.    Discharge disposition: 01-Home or Self Care  Diet: Regular  Special Instructions: 1. Repeat CBC/BMET in 1-2 weeks to monitor for stability. 2. Continue back precautions.   Discharge Instructions    Ambulatory referral to Cardiology   Complete by: As directed    Patient with chronic hypotension--wants to change cardiologists.   Ambulatory referral to  Occupational Therapy   Complete by: As directed    Eval and treat   Ambulatory referral to Physical Medicine Rehab   Complete by: As directed    1-2 weeks TC appt   Ambulatory referral to Physical Therapy   Complete by: As directed    Iontophoresis - 4 mg/ml of dexamethasone: No   T.E.N.S. Unit Evaluation and Dispense as Indicated: No   Eval and treat     Allergies as of 08/31/2020      Reactions   Shellfish Allergy Anaphylaxis   Meperidine Hcl Other (See Comments)   BRADYCARDIA   Morphine Other (See Comments)   BRADYCARDIA   Bee Pollen Other (See Comments)   Unknown   Ivp Dye [iodinated Diagnostic Agents] Other (See Comments)   Swelling    Oxycodone Hives, Itching   Blisters on back   Propoxyphene Nausea Only, Nausea And Vomiting   Codeine Nausea And Vomiting   Oxycodone-acetaminophen Itching   Pentazocine Lactate Nausea And Vomiting   REACTION: vomiting with Talwin NX   Propoxyphene N-acetaminophen Nausea And Vomiting      Medication List    STOP taking these medications   celecoxib 200 MG capsule Commonly known as: CELEBREX   HYDROcodone-acetaminophen 5-325 MG tablet Commonly known as: NORCO/VICODIN   methocarbamol 750 MG tablet Commonly known as: ROBAXIN  TAKE these medications   fludrocortisone 0.1 MG tablet Commonly known as: FLORINEF Take 2 tablets (0.2 mg total) by mouth daily. What changed: how much to take   gabapentin 400 MG capsule Commonly known as: NEURONTIN Take 3 capsules (1,200 mg total) by mouth at bedtime. What changed:   how much to take  when to take this   HYDROmorphone 4 MG tablet---Rx 24 pills Commonly known as: DILAUDID Take 1 tablet (4 mg total) by mouth every 8 (eight) hours as needed for severe pain.   metaxalone 800 MG tablet Commonly known as: SKELAXIN Take 1 tablet (800 mg total) by mouth 3 (three) times daily as needed for muscle spasms.   metoCLOPramide 5 MG tablet Commonly known as: REGLAN Take 1 tablet (5 mg  total) by mouth 3 (three) times daily before meals. For one week, then decrease to twice a day for a week, then once a day till gone.   midodrine 10 MG tablet Commonly known as: PROAMATINE Take 1 tablet (10 mg total) by mouth 3 (three) times daily.   multivitamin with minerals Tabs tablet Take 1 tablet by mouth 2 (two) times daily.   pantoprazole 20 MG tablet Commonly known as: PROTONIX Take 1 tablet (20 mg total) by mouth daily.   polyethylene glycol 17 g packet Commonly known as: MIRALAX / GLYCOLAX Take 17 g by mouth daily. Notes to patient: For constipation.    pramipexole 0.125 MG tablet Commonly known as: MIRAPEX Take 0.5 mg by mouth at bedtime.   topiramate 100 MG tablet Commonly known as: TOPAMAX Take 1 tablet (100 mg total) by mouth at bedtime. What changed:   medication strength  how much to take   traZODone 50 MG tablet Commonly known as: DESYREL Take 0.5-1 tablets (25-50 mg total) by mouth at bedtime as needed for sleep.   Xiidra 5 % Soln Generic drug: Lifitegrast Place 1 drop into both eyes in the morning and at bedtime.       Follow-up Information    Lovorn, Jinny Blossom, MD Follow up.   Specialty: Physical Medicine and Rehabilitation Why: Office will call you with follow up appointment Contact information: [address] [place] [address] [phone]        Eustace Moore, MD. Call on 09/01/2020.   Specialty: Neurosurgery Why: for post op appointment Contact information: [address] [place] [address] [phone]        Adrian Prows, MD Follow up on 09/10/2020.   Specialty: Cardiology Why: Be there at 11 am for 11:30 appointment.  Contact information: [place] [address] [phone]        Maryland Pink, MD. Call.   Specialty: Family Medicine Why: for  post hospital follow up Contact information: [address] [place]  [address] [phone]        Minna Merritts, MD .   Specialty: Cardiology Contact information: [place] [address] [phone]               Signed: Bary Leriche 08/31/2020, 10:37 AM

## 2020-08-31 NOTE — Patient Instructions (Signed)
49 year old female with history of HTN, migraines, morbid obesity--BMI-41, chronic hypotension, multiple back surgeries with chronic pain with LLE lumbar radiculopathy who was scheduled to have spinal cord stimulator placed by Dr. Davy Pique on 08/11/2020 but unable to perform surgery due to scar tissue.  History taken from patient and chart review.  Post op on awakening, she had excruciating pain RLE with plegia and and allodynia. She was admitted for work up by Dr. Ronnald Ramp from surgical center on 08/11/2020. Thoracic MRI done revealing subtle increased T2/STIR intensity distal cord at T12-L1 level suspicious for edema/acute spinal cord injury, no CSF leak as well as prior PLIF L2-L5 with residual foraminal protrusion L3/5 potentially irritating right L3 nerve. She was started on IV Dilaudid, gabapentin as well as IV Decadron for management of pain, headaches and severe neuropathy.  She continues to have pain in RLE but is having some motor return, has constant headache, decreased hearing in right> left ear, constipation with nausea as well as epigastric pain and weakness. Therapy ongoing and CIR recommended due to functional decline.  Of note, she reports 50 lbs weigh loss in the past 3-4 months--due to issues with N/V/intake. Has had two falls last month---no recall of incident leading to fall question due to syncope. Was in process of work up for renal disease. She has had issue with LLE weakness with pain for the past year and being followed by Dr. Ernestina Patches. Did well with stimulator trial.

## 2020-08-31 NOTE — Discharge Instructions (Signed)
Inpatient Rehab Discharge Instructions  Vanessa Medina Discharge date and time:  08/31/20  Activities/Precautions/ Functional Status: Activity: no lifting, driving, or strenuous exercise till cleared by MD Diet: regular diet Wound Care: keep wound clean and dry    Functional status:  ___ No restrictions     ___ Walk up steps independently _X__ 24/7 supervision/assistance   ___ Walk up steps with assistance ___ Intermittent supervision/assistance  ___ Bathe/dress independently ___ Walk with walker     ___ Bathe/dress with assistance ___ Walk Independently    ___ Shower independently ___ Walk with assistance    _X__ Shower with assistance _X__ No alcohol     ___ Return to work/school ________   Special Instructions: 1. You need to eat 3-5 small meals if not hungry. You need to continue to drink plenty of fluids to avoid getting in to trouble with renal failure and blood pressure drops.  2. Need to continue home exercise plan 2-3 times a day.    My questions have been answered and I understand these instructions. I will adhere to these goals and the provided educational materials after my discharge from the hospital.  Patient/Caregiver Signature _______________________________ Date __________  Clinician Signature _______________________________________ Date __________  Please bring this form and your medication list with you to all your follow-up doctor's appointments.

## 2020-08-31 NOTE — Progress Notes (Signed)
[facility] PHYSICAL MEDICINE & REHABILITATION PROGRESS NOTE   Subjective/Complaints: Patient seen standing up and sitting this AM.  She states she slept well overnight.  She is excited for discharge.  ROS: Denies CP, SOB, N/V/D  Objective:   No results found. Recent Labs    08/30/20 0459  WBC 4.4  HGB 9.3*  HCT 28.0*  PLT 202   Recent Labs    08/30/20 0459  NA 139  K 3.6  CL 109  CO2 20*  GLUCOSE 92  BUN 13  CREATININE 0.95  CALCIUM 8.8*    Intake/Output Summary (Last 24 hours) at 08/31/2020 0929 Last data filed at 08/30/2020 1839 Gross per 24 hour  Intake 480 ml  Output --  Net 480 ml        Physical Exam: Vital Signs Blood pressure (!) 113/58, pulse 65, temperature 98.7 F (37.1 C), resp. rate 18, height 5' 1.42" (1.56 m), weight 114.4 kg, SpO2 97 %. Constitutional: No distress . Vital signs reviewed. HENT: Normocephalic.  Atraumatic. Eyes: EOMI. No discharge. Cardiovascular: No JVD.  RRR. Respiratory: Normal effort.  No stridor.  Bilateral clear to auscultation. GI: Non-distended.  BS +. Skin: Warm and dry.  Intact. Psych: Normal mood.  Normal behavior. Musc: No edema in extremities.  No tenderness in extremities. Neuro: Alert Motor: Bilateral upper extremities: 4/5 proximal distal RLE: Hip flexion, knee extension 4/5, ankle dorsiflexion 4-/5 LLE: Hip flexion, knee extension, dorsiflexion 4/5  Assessment/Plan: 1. Functional deficits which require 3+ hours per day of interdisciplinary therapy in a comprehensive inpatient rehab setting.  Physiatrist is providing close team supervision and 24 hour management of active medical problems listed below.  Physiatrist and rehab team continue to assess barriers to discharge/monitor patient progress toward functional and medical goals  Care Tool:  Bathing    Body parts bathed by patient: Right arm,Left arm,Chest,Abdomen,Front perineal area,Buttocks,Right upper leg,Left upper leg,Right lower leg,Left  lower leg,Face   Body parts bathed by helper: Left lower leg     Bathing assist Assist Level: Supervision/Verbal cueing     Upper Body Dressing/Undressing Upper body dressing   What is the patient wearing?: Pull over shirt    Upper body assist Assist Level: Independent    Lower Body Dressing/Undressing Lower body dressing      What is the patient wearing?: Pants     Lower body assist Assist for lower body dressing: Independent with assitive device     Toileting Toileting    Toileting assist Assist for toileting: Independent with assistive device     Transfers Chair/bed transfer  Transfers assist     Chair/bed transfer assist level: Supervision/Verbal cueing Chair/bed transfer assistive device: Programmer, multimedia   Ambulation assist      Assist level: Supervision/Verbal cueing Assistive device: Walker-rolling Max distance: 130ft   Walk 10 feet activity   Assist     Assist level: Supervision/Verbal cueing Assistive device: Walker-rolling   Walk 50 feet activity   Assist Walk 50 feet with 2 turns activity did not occur: Safety/medical concerns  Assist level: Supervision/Verbal cueing Assistive device: Walker-rolling    Walk 150 feet activity   Assist Walk 150 feet activity did not occur: Safety/medical concerns  Assist level: Supervision/Verbal cueing Assistive device: Walker-rolling    Walk 10 feet on uneven surface  activity   Assist Walk 10 feet on uneven surfaces activity did not occur: Safety/medical concerns   Assist level: Supervision/Verbal cueing Assistive device: Aeronautical engineer  Will patient use wheelchair at discharge?: Yes Type of Wheelchair: Manual    Wheelchair assist level: Supervision/Verbal cueing Max wheelchair distance: 338ft    Wheelchair 50 feet with 2 turns activity    Assist        Assist Level: Supervision/Verbal cueing   Wheelchair 150 feet activity      Assist      Assist Level: Supervision/Verbal cueing    Medical Problem List and Plan: 1.  Deficits with mobility, transfers, endurance, self-care secondary to paraparesis.   DC today  Patient to follow for hospital follow-up in 1 month post discharge 2.  Antithrombotics: -DVT/anticoagulation:  Pharmaceutical: Lovenox             -antiplatelet therapy: N/A 3. Pain Management: On IV decadron, robaxin and tylenol prn.              Dilaudid 4 mg q4 hours prn for severe pain  Changed topamax and gabapentin to all dose at bedtime. Per pt request.   Controlled with meds on 12/28 4. Mood: LCSW to follow for evaluation and support.              -antipsychotic agents: N/A 5. Neuropsych: This patient is capable of making decisions on her own behalf. 6. Skin/Wound Care: Routine pressure relief measures.  7. Fluids/Electrolytes/Nutrition: Monitor I/Os. Discontinue IVF and encourage fluid intake.  8. Migraines: Takes Topamax at bedtime. Now constant due to spinal cord injury. Supportive care recommended.  Controlled with meds on 12/28 9. Constipation: Likely obstipation as has not had BM since admission.             Continue Reglan order enema and augment bowel regimen.   Sorbitol prn 10. Bradycardia: Resolved 11. Chronic hypotension:  On midodrine tid with florinef per Dr. Rockey Situ.     Add abdominal binder and ted hose.              Monitor with increased exertion  Labile on 12/28 12. Morbid obesity s/p gastric sleeve: Encourage/educate on weight loss and dietary changes to help promote overall health and mobility.  13. Acute on chronic renal failure: Resolved   Ultrasound 08/2020 showing medical renal disease. BUN/SCr-26/1.7 on 11/30. Had normal renal function a year ago  Creatinine 0.95 on 12/27  14. Anemia:   Hemoglobin 9.3 on 12/27, continue to monitor 15. R posterior thigh pain- improved  Skelaxin 800 mg TID prn- doesn't cause sedation.   Controlled on 12/27 17.   Hypokalemia  Supplemented  Potassium 3.6 on 12/27  LOS: 13 days A FACE TO FACE EVALUATION WAS PERFORMED  Arcelia Pals [name] 08/31/2020, 9:29 AM

## 2020-08-31 NOTE — Progress Notes (Signed)
[place] PDMP reviewed for the past year prior to prescribing Dilaudid 4 mg # 24 pills --one po every 8 hrs prn--advised to limit to once a day as able.

## 2020-09-01 ENCOUNTER — Other Ambulatory Visit: Payer: Self-pay

## 2020-09-01 ENCOUNTER — Ambulatory Visit: Payer: 59 | Attending: Physical Medicine and Rehabilitation | Admitting: Physical Therapy

## 2020-09-01 VITALS — BP 127/66 | HR 66

## 2020-09-01 DIAGNOSIS — R262 Difficulty in walking, not elsewhere classified: Secondary | ICD-10-CM | POA: Diagnosis not present

## 2020-09-01 DIAGNOSIS — M6281 Muscle weakness (generalized): Secondary | ICD-10-CM | POA: Diagnosis not present

## 2020-09-01 DIAGNOSIS — R296 Repeated falls: Secondary | ICD-10-CM

## 2020-09-01 DIAGNOSIS — S34109S Unspecified injury to unspecified level of lumbar spinal cord, sequela: Secondary | ICD-10-CM

## 2020-09-01 DIAGNOSIS — M5442 Lumbago with sciatica, left side: Secondary | ICD-10-CM

## 2020-09-01 DIAGNOSIS — G903 Multi-system degeneration of the autonomic nervous system: Secondary | ICD-10-CM

## 2020-09-01 DIAGNOSIS — R2689 Other abnormalities of gait and mobility: Secondary | ICD-10-CM

## 2020-09-01 NOTE — Therapy (Signed)
[place] MAIN [facility] SERVICES [address] [place], [place], [address] Phone: [phone]   Fax:  [phone]  Physical Therapy Evaluation  Patient Details  Name: Vanessa Medina MRN: [id number] Date of Birth: Jan 22, 1971 Referring Provider (PT): Dr. Erling Cruz   Encounter Date: 09/01/2020   PT End of Session - 09/01/20 1125    Visit Number 1    Number of Visits 17    Date for PT Re-Evaluation 10/27/20    Authorization Type eval: 12/29    PT Start Time 1100    PT Stop Time 1200    PT Time Calculation (min) 60 min    Equipment Utilized During Treatment Gait belt    Activity Tolerance Patient tolerated treatment well    Behavior During Therapy [facility] for tasks assessed/performed           Past Medical History:  Diagnosis Date  . Anemia   . Cervical cancer ([place])   . Family history of adverse reaction to anesthesia     " my mother takes a long time time wake up"  . GERD (gastroesophageal reflux disease)   . Headache    migraine  . History of shingles   . Hypertension   . Migraine   . Multiple allergies   . Obesity   . Osteoarthritis    left hip  . PONV (postoperative nausea and vomiting)   . Sleep apnea    does not wear CPAP  . Wears glasses     Past Surgical History:  Procedure Laterality Date  . ABDOMINAL HYSTERECTOMY    . APPENDECTOMY    . BILIOPANCREATIC DIVERSION     with duodenal switch laparoscopic   . CHOLECYSTECTOMY    . DIAGNOSTIC LAPAROSCOPY     LOA  . ESOPHAGOGASTRODUODENOSCOPY (EGD) WITH PROPOFOL N/A 07/25/2017   Procedure: ESOPHAGOGASTRODUODENOSCOPY (EGD) WITH PROPOFOL;  Surgeon: Manya Silvas, MD;  Location: [facility] ENDOSCOPY;  Service: Endoscopy;  Laterality: N/A;  . KNEE ARTHROSCOPY    . LAPAROSCOPIC GASTRIC SLEEVE RESECTION WITH HIATAL HERNIA REPAIR    . TONSILLECTOMY    . TOTAL HIP ARTHROPLASTY Left 01/26/2016   Procedure: LEFT TOTAL HIP ARTHROPLASTY ANTERIOR APPROACH;  Surgeon: Leandrew Koyanagi, MD;   Location: [facility];  Service: Orthopedics;  Laterality: Left;  . TRANSFORAMINAL LUMBAR INTERBODY FUSION (TLIF) WITH PEDICLE SCREW FIXATION 3 LEVEL  08/2019   Basil Dess, MD L2-L5    Vitals:   09/01/20 1116  BP: 127/66  Pulse: 66  SpO2: 99%      Subjective Assessment - 09/01/20 1116    Subjective Patient reports she was discharged yesterday from the hospital. She folded her laundry and was exhausted from it.    Pertinent History 49 year old female with history of HTN, migraines, morbid obesity--BMI-41, chronic hypotension, multiple back surgeries with chronic pain with LLE lumbar radiculopathy who was scheduled to have spinal cord stimulator placed by Dr. Davy Pique on 08/11/2020 but unable to perform surgery due to scar tissue.  History taken from patient and chart review.  Post op on awakening, she had excruciating pain RLE with plegia and and allodynia. She was admitted for work up by Dr. Ronnald Ramp from surgical center on 08/11/2020. Thoracic MRI done revealing subtle increased T2/STIR intensity distal cord at T12-L1 level suspicious for edema/acute spinal cord injury, no CSF leak as well as prior PLIF L2-L5 with residual foraminal protrusion L3/5 potentially irritating right L3 nerve. She was started on IV Dilaudid, gabapentin as well as IV Decadron  for management of pain, headaches and severe neuropathy.  She continues to have pain in RLE but is having some motor return, has constant headache, decreased hearing in right> left ear, constipation with nausea as well as epigastric pain and weakness. Therapy ongoing and CIR recommended due to functional decline.  Of note, she reports 50 lbs weigh loss in the past 3-4 months--due to issues with N/V/intake. Has had two falls last month---no recall of incident leading to fall question due to syncope. Was in process of work up for renal disease. She has had issue with LLE weakness with pain for the past year and being followed by Dr. Ernestina Patches. Did well with stimulator  trial.    How long can you sit comfortably? 1 hour    How long can you stand comfortably? 8 mins    How long can you walk comfortably? 20-30 minutes    Currently in Pain? Yes    Pain Location Back    Pain Orientation Lower    Pain Descriptors / Indicators Aching              [facility] PT Assessment - 09/01/20 0001      Assessment   Medical Diagnosis Stroke/SCI    Referring Provider (PT) Dr. Erling Cruz    Onset Date/Surgical Date 08/11/20    Hand Dominance Right    Prior Therapy Good outcomes with inpatient therapy      Precautions   Precautions Fall      Restrictions   Weight Bearing Restrictions No      Balance Screen   Has the patient fallen in the past 6 months Yes    How many times? 6    Has the patient had a decrease in activity level because of a fear of falling?  Yes    Is the patient reluctant to leave their home because of a fear of falling?  Yes      [place] residence    Living Arrangements Parent    Available Help at Discharge Family    Type of [place] to enter    Entrance Stairs-Number of Steps 2    Entrance Stairs-Rails Right;Left;Can reach both    [place] One level    [place] - 2 wheels;Walker - 4 wheels;Cane - quad;Cane - single point;Shower seat - built in;Shower seat;Toilet riser;Grab bars - toilet;Grab bars - tub/shower      Prior Function   Level of Independence Independent    Vocation Full time employment    Financial trader      Observation/Other Assessments   Other Surveys  Dizziness Handicap Inventory (DHI)    Dizziness Handicap Inventory (DHI)  30      Strength   Right Hip Flexion 2+/5    Right Hip ABduction 2+/5    Right Hip ADduction 2+/5    Left Hip Flexion 4-/5    Left Hip ABduction 4-/5    Left Hip ADduction 4-/5    Right Knee Extension 3/5    Left Knee Flexion 4-/5    Right Ankle Dorsiflexion 3-/5    Right Ankle Plantar Flexion 3-/5     Left Ankle Dorsiflexion 4-/5    Left Ankle Plantar Flexion 4-/5      Standardized Balance Assessment   Standardized Balance Assessment Timed Up and Go Test;Five Times Sit to Stand;10 meter walk test    Five times sit to stand comments  38.45s with  use of BUE to push up    10 Meter Walk 0.37 m/s      Timed Up and Go Test   TUG Normal TUG    Normal TUG (seconds) 32.94            SUBJECTIVE Chief complaint: Decreased strength, endurance, balance skills Onset: 08/11/2020 Recent changes in overall health/medication: Yes Directional pattern for falls: None Prior history of physical therapy for balance: Good outcomes with inpatient therapy Follow-up appointment with MD: Unknown at the moment Red flags (bowel/bladder changes, saddle paresthesia, personal history of cancer, chills/fever, night sweats, unrelenting pain) Negative   OBJECTIVE  MUSCULOSKELETAL: Tremor: Absent Bulk: Normal Tone: Normal, no clonus   NEUROLOGICAL:  Mental Status Patient is oriented to person, place and time.  Recent memory is intact.  Remote memory is intact.  Attention span and concentration are intact.  Expressive speech is intact.  Patient's fund of knowledge is within normal limits for educational level.   Sensation Grossly intact to light touch bilateral UEs/LEs as determined by testing dermatomes C2-T2/L2-S2 respectively Proprioception and hot/cold testing deferred on this date   Coordination/Cerebellar Finger to Nose: WNL Heel to Shin: WNL Rapid alternating movements: WNL Finger Opposition: WNL Pronator Drift: Negative   FUNCTIONAL OUTCOME MEASURES FOTO: 52 5STS: 38.45s TUG: 32.94s DHI: 30 10MWT: 0.37 m/s   ASSESSMENT Clinical Impression:  49 y.o. female presents to skilled physical therapy with a referral for significant deficits in strength, mobility, and balance. Patient has a decreased tolerance to upright positioning with a significant drop of BP with vertical positions.  Patient has tendency to pass out due to orthostatic hypotension. Her symptoms include: getting quiet, limp on one side, eyes rolling backwards. Patient reports with a sternal rub and name calling, patient will come back to consciousness. Patient presents with deficits in strength with MMT score of 3+/5 on RLE and 4/5 on LLE. Patient also presents with decreased balance skills as evidenced by TUG score of 32.94 and 10 MWT of 0.37 m/s. Patient notices focusing on a target and taking few deep breaths helps with maintaining vital signs and activity.  Patient presents with deficits in strength, gait and balance. Patient will benefit from skilled PT services to address deficits in balance and decrease risk for future falls.  PLAN Next Visit: Initiate strengthening and balance exercises HEP: Initiate HEP   Plan - 09/01/20 1746    Clinical Impression Statement 49 y.o. female presents to skilled physical therapy with a referral for significant deficits in strength, mobility, and balance. Patient has a decreased tolerance to upright positioning with a significant drop of BP with vertical positions. Patient has tendency to pass out due to orthostatic hypotension. Her symptoms include: getting quiet, limp on one side, eyes rolling backwards. Patient reports with a sternal rub and name calling, patient will come back to consciousness. Patient presents with deficits in strength with MMT score of 3+/5 on RLE and 4/5 on LLE. Patient also presents with decreased balance skills as evidenced by TUG score of 32.94 and 10 MWT of 0.37 m/s. Patient notices focusing on a target and taking few deep breaths helps with maintaining vital signs and activity.  Patient presents with deficits in strength, gait and balance. Patient will benefit from skilled PT services to address deficits in balance and decrease risk for future falls.    Personal Factors and Comorbidities Comorbidity 3+;Time since onset of injury/illness/exacerbation     Comorbidities HYPOtension, paraparesis, SCI without spinal bone injury, DDD, cervical cancer, acute kidney  injury, GERD, asthma, bradycardia, L total hip replacement, hypokalemia    Examination-Activity Limitations Squat;Lift;Stairs;Bend;Stand;Reach Overhead;Carry;Transfers    Examination-Participation Restrictions Cleaning;Laundry;Meal Prep;Community Activity;Driving;Occupation    Stability/Clinical Decision Making Evolving/Moderate complexity    Clinical Decision Making Moderate    Rehab Potential Fair    PT Frequency 2x / week    PT Duration 12 weeks    PT Treatment/Interventions ADLs/Self Care Home Management;Aquatic Therapy;Gait training;Stair training;Functional mobility training;Therapeutic activities;Therapeutic exercise;Balance training;Moist Heat;Manual techniques;Patient/family education;Neuromuscular re-education;Passive range of motion;Energy conservation;Vestibular;Joint Manipulations;Spinal Manipulations    PT Next Visit Plan Initiate strengthening exercises    PT Home Exercise Plan Initiate HEP    Consulted and Agree with Plan of Care Patient           Patient will benefit from skilled therapeutic intervention in order to improve the following deficits and impairments:  Abnormal gait,Dizziness,Improper body mechanics,Decreased mobility,Cardiopulmonary status limiting activity,Decreased activity tolerance,Decreased endurance,Decreased range of motion,Decreased strength,Decreased balance,Decreased safety awareness,Difficulty walking,Impaired flexibility,Obesity  Visit Diagnosis: Spinal cord injury, lumbar, without spinal bone injury, sequela ([place])  Acute left-sided low back pain with left-sided sciatica  Neurogenic orthostatic hypotension (H[facility])  Muscle weakness (generalized)  Other abnormalities of gait and mobility  Difficulty in walking, not elsewhere classified  Repeated falls    PT Short Term Goals - 09/01/20 1349      PT SHORT TERM GOAL #1   Title Pt will be  independent with HEP in order to improve strength and balance in order to decrease fall risk and improve function at home and work.    Time 6    Period Weeks    Status New    Target Date 10/13/20           PT Long Term Goals - 09/01/20 1349      PT LONG TERM GOAL #1   Title Patient (< 26 years old) will complete five times sit to stand test in < 10 seconds indicating an increased LE strength and improved balance.    Baseline 12/29: 38.45s    Time 12    Period Weeks    Status New    Target Date 11/24/20      PT LONG TERM GOAL #2   Title Pt will decrease TUG to below 14 seconds/decrease in order to demonstrate decreased fall risk.    Baseline 12/29: 32.94s    Time 12    Period Weeks    Status New    Target Date 11/24/20      PT LONG TERM GOAL #3   Title Pt will decrease DHI score by at least 18 points in order to demonstrate clinically significant reduction in disability    Baseline 12/29: 30    Time 12    Period Weeks    Status New    Target Date 11/24/20      PT LONG TERM GOAL #4   Title Patient will increase 10 meter walk test to >1.58m/s as to improve gait speed for better community ambulation and to reduce fall risk.    Baseline 12/29: 0.37 m/s    Time 12    Period Weeks    Status New    Target Date 11/24/20      PT LONG TERM GOAL #5   Title Patient will increase FOTO score to equal to or greater than 64 to demonstrate statistically significant improvement in mobility and quality of life.    Baseline 12/29: 52    Time 12    Period Weeks    Status New  Target Date 11/24/20            Problem List Patient Active Problem List   Diagnosis Date Noted  . Chronic migraine without aura without status migrainosus, not intractable   . Hypokalemia   . Adjustment reaction with anxiety and depression   . Spinal cord injury, lumbar, without spinal bone injury, sequela ([place]) 08/18/2020  . Paraparesis ([place])   . Post-operative pain   . Hypotension   . Slow transit  constipation   . AKI (acute kidney injury) ([place])   . Right leg weakness 08/11/2020  . DDD (degenerative disc disease), lumbar 09/02/2019    Class: Chronic  . Degenerative disc disease, lumbar 09/02/2019  . Chest tightness 09/23/2018  . Bradycardia 09/23/2018  . Labile blood pressure 03/08/2018  . Chronic low back pain 09/03/2017  . History of total hip replacement, left 01/26/2016  . EDEMA 04/28/2008  . LEG PAIN, BILATERAL 12/16/2007  . CERVICAL CANCER 07/02/2007  . MORBID OBESITY 07/02/2007  . DEPRESSION 07/02/2007  . COMMON MIGRAINE 07/02/2007  . ALLERGIC RHINITIS 07/02/2007  . ASTHMA 07/02/2007  . GERD 07/02/2007  . ELEVATED BLOOD PRESSURE WITHOUT DIAGNOSIS OF HYPERTENSION 07/02/2007   Karl Luke PT, DPT Netta Corrigan 09/01/2020, 6:06 PM  [place] MAIN [facility] SERVICES [address] [place], [place], [address] Phone: [phone]   Fax:  [phone]  Name: Vanessa Medina MRN: [id number] Date of Birth: Feb 08, 1971

## 2020-09-06 ENCOUNTER — Encounter: Payer: Self-pay | Admitting: Occupational Therapy

## 2020-09-06 ENCOUNTER — Ambulatory Visit: Payer: 59 | Attending: Physical Medicine and Rehabilitation | Admitting: Occupational Therapy

## 2020-09-06 ENCOUNTER — Other Ambulatory Visit: Payer: Self-pay

## 2020-09-06 DIAGNOSIS — R278 Other lack of coordination: Secondary | ICD-10-CM | POA: Diagnosis not present

## 2020-09-06 DIAGNOSIS — R2681 Unsteadiness on feet: Secondary | ICD-10-CM | POA: Diagnosis present

## 2020-09-06 DIAGNOSIS — M6281 Muscle weakness (generalized): Secondary | ICD-10-CM

## 2020-09-06 DIAGNOSIS — R296 Repeated falls: Secondary | ICD-10-CM | POA: Insufficient documentation

## 2020-09-06 DIAGNOSIS — R2689 Other abnormalities of gait and mobility: Secondary | ICD-10-CM | POA: Diagnosis present

## 2020-09-06 DIAGNOSIS — M5442 Lumbago with sciatica, left side: Secondary | ICD-10-CM | POA: Diagnosis present

## 2020-09-06 DIAGNOSIS — S34109S Unspecified injury to unspecified level of lumbar spinal cord, sequela: Secondary | ICD-10-CM | POA: Insufficient documentation

## 2020-09-06 DIAGNOSIS — M5441 Lumbago with sciatica, right side: Secondary | ICD-10-CM | POA: Diagnosis present

## 2020-09-06 NOTE — Therapy (Signed)
[place] MAIN [facility] SERVICES [address] [place], [place], [address] Phone: [phone]   Fax:  [phone]  Occupational Therapy Evaluation  Patient Details  Name: Vanessa Medina MRN: [id number] Date of Birth: Nov 04, 1970 No data recorded  Encounter Date: 09/06/2020   OT End of Session - 09/06/20 1617    Visit Number 1    Number of Visits 24    Date for OT Re-Evaluation 11/29/20    Authorization Type Progress report period starting 09/06/2020    OT Start Time 1030    OT Stop Time 1135    OT Time Calculation (min) 65 min    Activity Tolerance Patient tolerated treatment well    Behavior During Therapy [facility] for tasks assessed/performed           Past Medical History:  Diagnosis Date  . Anemia   . Cervical cancer ([place])   . Family history of adverse reaction to anesthesia     " my mother takes a long time time wake up"  . GERD (gastroesophageal reflux disease)   . Headache    migraine  . History of shingles   . Hypertension   . Migraine   . Multiple allergies   . Obesity   . Osteoarthritis    left hip  . PONV (postoperative nausea and vomiting)   . Sleep apnea    does not wear CPAP  . Wears glasses     Past Surgical History:  Procedure Laterality Date  . ABDOMINAL HYSTERECTOMY    . APPENDECTOMY    . BILIOPANCREATIC DIVERSION     with duodenal switch laparoscopic   . CHOLECYSTECTOMY    . DIAGNOSTIC LAPAROSCOPY     LOA  . ESOPHAGOGASTRODUODENOSCOPY (EGD) WITH PROPOFOL N/A 07/25/2017   Procedure: ESOPHAGOGASTRODUODENOSCOPY (EGD) WITH PROPOFOL;  Surgeon: Manya Silvas, MD;  Location: [facility] ENDOSCOPY;  Service: Endoscopy;  Laterality: N/A;  . KNEE ARTHROSCOPY    . LAPAROSCOPIC GASTRIC SLEEVE RESECTION WITH HIATAL HERNIA REPAIR    . TONSILLECTOMY    . TOTAL HIP ARTHROPLASTY Left 01/26/2016   Procedure: LEFT TOTAL HIP ARTHROPLASTY ANTERIOR APPROACH;  Surgeon: Leandrew Koyanagi, MD;  Location: [place];  Service: Orthopedics;   Laterality: Left;  . TRANSFORAMINAL LUMBAR INTERBODY FUSION (TLIF) WITH PEDICLE SCREW FIXATION 3 LEVEL  08/2019   Basil Dess, MD L2-L5    There were no vitals filed for this visit.   Subjective Assessment - 09/06/20 1605    Subjective  Pt. walked to the therapy gym from the front entrance of the hospital today.    Patient is accompanied by: Family member    Pertinent History Pt. is a 50 y.o. female who had a Spinal cord stimular trial placement in November 2021. Pt. was admitted for Spinal cord stimulator placement on 08/11/2020. Pt. sustained a spinal cord Injury T12-L1, CVA. Pt. received inpatient rehab services.    Currently in Pain? No/denies    Pain Score 0-No pain   No pain while seated for OT eval. pt. reports most pain in evening, and nighttime after moving during the day.            [facility] OT Assessment - 09/06/20 0001      Assessment   Medical Diagnosis Stroke/CVA    Onset Date/Surgical Date 08/11/20    Hand Dominance Right      Precautions   Precautions Fall      Restrictions   Weight Bearing Restrictions No      Balance  Screen   Has the patient fallen in the past 6 months Yes    How many times? 5    Has the patient had a decrease in activity level because of a fear of falling?  Yes    Is the patient reluctant to leave their home because of a fear of falling?  Yes      Home  Environment   Family/patient expects to be discharged to: Private residence    Living Arrangements Parent    Available Help at Discharge Family   Son   Type of [place]   2 front stairs   Bathroom Shower/Tub Tub only;Curtain    [place] seat;Walker - 4 wheels;Bedside commode;Grab bars - tub/shower;Grab bars - toilet;Other (comment);Hand held shower head    Lives With Family      Prior Function   Level of Independence Independent    Vocation Full time employment    Financial trader      ADL   Eating/Feeding Independent    Grooming  Independent    Upper Body Bathing Independent    Lower Body Bathing Modified independent    Upper Body Dressing Independent    Lower Body Dressing Modified independent    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 independent      IADL   Shopping Needs to be accompanied on any shopping trip    Prior Level of Function Light Housekeeping Independent    Light Housekeeping Needs help with all home maintenance tasks    Prior Level of Function Meal Prep Independent    Meal Prep Able to complete simple cold meal and snack prep    Prior Level of Function Scientist, research (physical sciences) Relies on family or friends for transportation    Prior Level of Function Medication Managment Independent    Medication Management Is responsible for taking medication in correct dosages at correct time    Prior Level of Function Therapist, sports financial matters independently (budgets, writes checks, pays rent, bills goes to bank), collects and keeps track of income      Written Expression   Dominant Hand Right      Vision - History   Baseline Vision Wears glasses all the time      Cognition   Overall Cognitive Status Within Functional Limits for tasks assessed      Observation/Other Assessments   Focus on Therapeutic Outcomes (FOTO)  54      Sensation   Light Touch Appears Intact    Proprioception Appears Intact      Coordination   Gross Motor Movements are Fluid and Coordinated --   yes   Fine Motor Movements are Fluid and Coordinated No    Right 9 Hole Peg Test 27 sec.    Left 9 Hole Peg Test 25 sec.      Strength   Overall Strength Comments RUE shoulder flexion 3+/5, abduction 3/5, elbow flexion, extension wrist flexion, extension 4/5. LUE strength 4/5      Hand Function   Right Hand Grip (lbs) 7    Right Hand Lateral Pinch 5 lbs    Right Hand 3 Point Pinch 4 lbs     Left Hand Grip (lbs) 18    Left Hand Lateral Pinch 7 lbs    Left 3 point pinch 5 lbs  OT Long Term Goals - 09/06/20 1629      OT LONG TERM GOAL #1   Title Pt. will improve FOTO score by 2 points to improve Focus on functional therapeutic outcomes  for FOTO related daily tasks.    Baseline Eval: FOTO score 54    Time 12    Period Weeks    Status New    Target Date 11/29/20      OT LONG TERM GOAL #2   Title Pt. will indepndently, and efficiently type email correspondence inpreparation for work repated typing tasks.    Baseline Eval: Pt. has difficulty typing  accurately, and efficiently    Time 12    Period Weeks    Status New    Target Date 11/29/20      OT LONG TERM GOAL #3   Title Pt. will independently use scissors to accuraltely cut fabric for crafting projects.    Baseline Eval: pt. has difficulty using scissors for  cutting  fabric staright.    Time 12    Period Weeks    Status New    Target Date 11/29/20      OT LONG TERM GOAL #4   Title Pt. will improve activity tolerance to be able to complete light home making tasks with modified independence.    Baseline Eva: Pt. is able to initiate, and perform parts of making tasks with rest breaks.    Time 12    Period Weeks    Status New    Target Date 11/29/20      OT LONG TERM GOAL #5   Title Pt. will perform light meal perparation efficiently with modifed independence.    Baseline EVal: Pt. has difficulty    Time 12    Period Weeks    Status New    Target Date 11/29/20                 Plan - 09/06/20 1618    Clinical Impression Statement Pt. is a 50 y.o. female who sustained a spinal cord injury, and CVA following placement of a spinal cord stimulator. Pt. being recently diagnosed with Pott's Disease, and has epeisodes of Hypostatic Hypotension with passing out while in [facility] rehab. Pt. reports that BP has been regulated while in inpatient rehab which  the pt. reports has helped. Pt. reports no additional episodes of passing out in the past few weeks since inpatient rehab. Pt. presents with decreased BUE strength, impaired bilateral grip strength, and impaired [facility] skills which limit her ability to complete basic daily IADL tasks efficiently. Pt. will benefit from OT serivces to work towards imparoving strength, endurance, grip strength, pinch strength, and right dominant hand [facility] skills in order to be able to improve her ability to engage in daily IADL tasks, typing for work related tasks, scissor use, and crafting.    Occupational performance deficits (Please refer to evaluation for details): ADL's;IADL's    Rehab Potential Good    Clinical Decision Making Several treatment options, min-mod task modification necessary    Comorbidities Affecting Occupational Performance: May have comorbidities impacting occupational performance    Modification or Assistance to Complete Evaluation  Min-Moderate modification of tasks or assist with assess necessary to complete eval    OT Frequency 2x / week    OT Duration 12 weeks    OT Treatment/Interventions Self-care/ADL training;Therapeutic exercise;Patient/family education;DME and/or AE instruction;Neuromuscular education    Consulted and Agree with Plan of Care Patient  Patient will benefit from skilled therapeutic intervention in order to improve the following deficits and impairments:           Visit Diagnosis: Other lack of coordination  Muscle weakness (generalized)    Problem List Patient Active Problem List   Diagnosis Date Noted  . Chronic migraine without aura without status migrainosus, not intractable   . Hypokalemia   . Adjustment reaction with anxiety and depression   . Spinal cord injury, lumbar, without spinal bone injury, sequela ([place]) 08/18/2020  . Paraparesis ([place])   . Post-operative pain   . Hypotension   . Slow transit constipation   . AKI (acute kidney injury)  ([place])   . Right leg weakness 08/11/2020  . DDD (degenerative disc disease), lumbar 09/02/2019    Class: Chronic  . Degenerative disc disease, lumbar 09/02/2019  . Chest tightness 09/23/2018  . Bradycardia 09/23/2018  . Labile blood pressure 03/08/2018  . Chronic low back pain 09/03/2017  . History of total hip replacement, left 01/26/2016  . EDEMA 04/28/2008  . LEG PAIN, BILATERAL 12/16/2007  . CERVICAL CANCER 07/02/2007  . MORBID OBESITY 07/02/2007  . DEPRESSION 07/02/2007  . COMMON MIGRAINE 07/02/2007  . ALLERGIC RHINITIS 07/02/2007  . ASTHMA 07/02/2007  . GERD 07/02/2007  . ELEVATED BLOOD PRESSURE WITHOUT DIAGNOSIS OF HYPERTENSION 07/02/2007    Harrel Carina, MS, OTR/L 09/06/2020, 4:40 PM  [place] MAIN [facility] SERVICES [address] [place], [place], [address] Phone: [phone]   Fax:  [phone]  Name: Vanessa Medina MRN: [id number] Date of Birth: [birth year]

## 2020-09-08 ENCOUNTER — Ambulatory Visit: Payer: 59 | Admitting: Occupational Therapy

## 2020-09-08 ENCOUNTER — Other Ambulatory Visit: Payer: Self-pay

## 2020-09-08 ENCOUNTER — Encounter: Payer: Self-pay | Admitting: Occupational Therapy

## 2020-09-08 ENCOUNTER — Ambulatory Visit: Payer: 59

## 2020-09-08 DIAGNOSIS — S34109S Unspecified injury to unspecified level of lumbar spinal cord, sequela: Secondary | ICD-10-CM

## 2020-09-08 DIAGNOSIS — R278 Other lack of coordination: Secondary | ICD-10-CM | POA: Diagnosis not present

## 2020-09-08 DIAGNOSIS — M6281 Muscle weakness (generalized): Secondary | ICD-10-CM

## 2020-09-08 DIAGNOSIS — R2689 Other abnormalities of gait and mobility: Secondary | ICD-10-CM

## 2020-09-08 NOTE — Therapy (Signed)
[place] MAIN [facility] SERVICES [address] [place], [place], [address] Phone: [phone]   Fax:  [phone]  Physical Therapy Treatment  Patient Details  Name: Vanessa Medina MRN: [id number] Date of Birth: [birth year] Referring Provider (PT): Dr. Erling Cruz   Encounter Date: 09/08/2020   PT End of Session - 09/08/20 0928    Visit Number 2    Number of Visits 17    Date for PT Re-Evaluation 10/27/20    Authorization Type eval: 12/29    PT Start Time 0845    PT Stop Time 0925    PT Time Calculation (min) 40 min    Equipment Utilized During Treatment Gait belt    Activity Tolerance Patient tolerated treatment well    Behavior During Therapy [facility] for tasks assessed/performed           Past Medical History:  Diagnosis Date  . Anemia   . Cervical cancer ([place])   . Family history of adverse reaction to anesthesia     " my mother takes a long time time wake up"  . GERD (gastroesophageal reflux disease)   . Headache    migraine  . History of shingles   . Hypertension   . Migraine   . Multiple allergies   . Obesity   . Osteoarthritis    left hip  . PONV (postoperative nausea and vomiting)   . Sleep apnea    does not wear CPAP  . Wears glasses     Past Surgical History:  Procedure Laterality Date  . ABDOMINAL HYSTERECTOMY    . APPENDECTOMY    . BILIOPANCREATIC DIVERSION     with duodenal switch laparoscopic   . CHOLECYSTECTOMY    . DIAGNOSTIC LAPAROSCOPY     LOA  . ESOPHAGOGASTRODUODENOSCOPY (EGD) WITH PROPOFOL N/A 07/25/2017   Procedure: ESOPHAGOGASTRODUODENOSCOPY (EGD) WITH PROPOFOL;  Surgeon: Manya Silvas, MD;  Location: [facility] ENDOSCOPY;  Service: Endoscopy;  Laterality: N/A;  . KNEE ARTHROSCOPY    . LAPAROSCOPIC GASTRIC SLEEVE RESECTION WITH HIATAL HERNIA REPAIR    . TONSILLECTOMY    . TOTAL HIP ARTHROPLASTY Left 01/26/2016   Procedure: LEFT TOTAL HIP ARTHROPLASTY ANTERIOR APPROACH;  Surgeon: Leandrew Koyanagi, MD;  Location:  [place];  Service: Orthopedics;  Laterality: Left;  . TRANSFORAMINAL LUMBAR INTERBODY FUSION (TLIF) WITH PEDICLE SCREW FIXATION 3 LEVEL  08/2019   Basil Dess, MD L2-L5    There were no vitals filed for this visit.   Subjective Assessment - 09/08/20 0927    Subjective Patient reports morning is her best time, is wearing her compression stockings and support brace for POTS today. Has multiple near falls but none to the ground.    Pertinent History 50 year old female with history of HTN, migraines, morbid obesity--BMI-41, chronic hypotension, multiple back surgeries with chronic pain with LLE lumbar radiculopathy who was scheduled to have spinal cord stimulator placed by Dr. Davy Pique on 08/11/2020 but unable to perform surgery due to scar tissue.  History taken from patient and chart review.  Post op on awakening, she had excruciating pain RLE with plegia and and allodynia. She was admitted for work up by Dr. Ronnald Ramp from surgical center on 08/11/2020. Thoracic MRI done revealing subtle increased T2/STIR intensity distal cord at T12-L1 level suspicious for edema/acute spinal cord injury, no CSF leak as well as prior PLIF L2-L5 with residual foraminal protrusion L3/5 potentially irritating right L3 nerve. She was started on IV Dilaudid, gabapentin as well as IV Decadron  for management of pain, headaches and severe neuropathy.  She continues to have pain in RLE but is having some motor return, has constant headache, decreased hearing in right> left ear, constipation with nausea as well as epigastric pain and weakness. Therapy ongoing and CIR recommended due to functional decline.  Of note, she reports 50 lbs weigh loss in the past 3-4 months--due to issues with N/V/intake. Has had two falls last month---no recall of incident leading to fall question due to syncope. Was in process of work up for renal disease. She has had issue with LLE weakness with pain for the past year and being followed by Dr. Ernestina Patches. Did well  with stimulator trial.    How long can you sit comfortably? 1 hour    How long can you stand comfortably? 8 mins    How long can you walk comfortably? 20-30 minutes    Currently in Pain? Yes    Pain Score 3     Pain Location Back    Pain Orientation Right    Pain Descriptors / Indicators Aching    Pain Type Neuropathic pain    Pain Onset More than a month ago    Pain Frequency Intermittent                Access Code: [email or web address] URL: [email or web address] Date: 09/08/2020 Prepared by: Janna Arch  Education and performance of following Home Exercises  . Supine Bridge - 1 x daily - 7 x weekly - 2 sets - 10 reps - 5 hold . Supine Hip Adduction Isometric with Ball - 1 x daily - 7 x weekly - 2 sets - 10 reps - 5 hold . Supine March - 1 x daily - 7 x weekly - 2 sets - 10 reps - 5 hold . Supine Transversus Abdominis Bracing - Hands on Stomach - 1 x daily - 7 x weekly - 2 sets - 10 reps - 5 hold . Hooklying Isometric Clamshell - 1 x daily - 7 x weekly - 2 sets - 10 reps - 5 hold . Seated March - 1 x daily - 7 x weekly - 2 sets - 10 reps - 5 hold . Seated Hip Adduction Isometrics with Ball - 1 x daily - 7 x weekly - 2 sets - 10 reps - 5 hold . Seated Long Arc Quad - 1 x daily - 7 x weekly - 2 sets - 10 reps - 5 hold . Seated Ankle Circles - 1 x daily - 7 x weekly - 2 sets - 10 reps - 5 hold Seated Hip Abduction with Resistance - 1 x daily - 7 x weekly - 2 sets - 10 reps - 5 hold    Pt educated throughout session about proper posture and technique with exercises. Improved exercise technique, movement at target joints, use of target muscles after min to mod verbal, visual, tactile cues                      PT Education - 09/08/20 0928    Education Details HEP    Person(s) Educated Patient    Methods Explanation;Demonstration;Tactile cues;Verbal cues;Handout    Comprehension Verbalized understanding;Returned demonstration;Verbal cues required;Tactile  cues required            PT Short Term Goals - 09/01/20 1349      PT SHORT TERM GOAL #1   Title Pt will be independent with HEP in order to improve strength and balance in order to  decrease fall risk and improve function at home and work.    Time 6    Period Weeks    Status New    Target Date 10/13/20             PT Long Term Goals - 09/01/20 1349      PT LONG TERM GOAL #1   Title Patient (< 72 years old) will complete five times sit to stand test in < 10 seconds indicating an increased LE strength and improved balance.    Baseline 12/29: 38.45s    Time 12    Period Weeks    Status New    Target Date 11/24/20      PT LONG TERM GOAL #2   Title Pt will decrease TUG to below 14 seconds/decrease in order to demonstrate decreased fall risk.    Baseline 12/29: 32.94s    Time 12    Period Weeks    Status New    Target Date 11/24/20      PT LONG TERM GOAL #3   Title Pt will decrease DHI score by at least 18 points in order to demonstrate clinically significant reduction in disability    Baseline 12/29: 30    Time 12    Period Weeks    Status New    Target Date 11/24/20      PT LONG TERM GOAL #4   Title Patient will increase 10 meter walk test to >1.8m/s as to improve gait speed for better community ambulation and to reduce fall risk.    Baseline 12/29: 0.37 m/s    Time 12    Period Weeks    Status New    Target Date 11/24/20      PT LONG TERM GOAL #5   Title Patient will increase FOTO score to equal to or greater than 64 to demonstrate statistically significant improvement in mobility and quality of life.    Baseline 12/29: 52    Time 12    Period Weeks    Status New    Target Date 11/24/20                 Plan - 09/08/20 1814    Clinical Impression Statement Patient educated on HEP and performed full range of reps and sets with cueing for sequencing and muscle activation. She demonstrated good understanding and given handout for home performance. Rest  between each transition required with purse lipped breathing to reduce onset of POTS. Patient notices focusing on a target and taking few deep breaths helps with maintaining vital signs and activity. Patient presents with deficits in strength, gait and balance. Patient will benefit from skilled PT services to address deficits in balance and decrease risk for future falls.    Personal Factors and Comorbidities Comorbidity 3+;Time since onset of injury/illness/exacerbation    Comorbidities HYPOtension, paraparesis, SCI without spinal bone injury, DDD, cervical cancer, acute kidney injury, GERD, asthma, bradycardia, L total hip replacement, hypokalemia    Examination-Activity Limitations Squat;Lift;Stairs;Bend;Stand;Reach Overhead;Carry;Transfers    Examination-Participation Restrictions Cleaning;Laundry;Meal Prep;Community Activity;Driving;Occupation    Stability/Clinical Decision Making Evolving/Moderate complexity    Rehab Potential Fair    PT Frequency 2x / week    PT Duration 12 weeks    PT Treatment/Interventions ADLs/Self Care Home Management;Aquatic Therapy;Gait training;Stair training;Functional mobility training;Therapeutic activities;Therapeutic exercise;Balance training;Moist Heat;Manual techniques;Patient/family education;Neuromuscular re-education;Passive range of motion;Energy conservation;Vestibular;Joint Manipulations;Spinal Manipulations    PT Next Visit Plan Initiate strengthening exercises    PT Home Exercise Plan Initiate HEP  Consulted and Agree with Plan of Care Patient           Patient will benefit from skilled therapeutic intervention in order to improve the following deficits and impairments:  Abnormal gait,Dizziness,Improper body mechanics,Decreased mobility,Cardiopulmonary status limiting activity,Decreased activity tolerance,Decreased endurance,Decreased range of motion,Decreased strength,Decreased balance,Decreased safety awareness,Difficulty walking,Impaired  flexibility,Obesity  Visit Diagnosis: Muscle weakness (generalized)  Spinal cord injury, lumbar, without spinal bone injury, sequela ([place])  Other abnormalities of gait and mobility     Problem List Patient Active Problem List   Diagnosis Date Noted  . Chronic migraine without aura without status migrainosus, not intractable   . Hypokalemia   . Adjustment reaction with anxiety and depression   . Spinal cord injury, lumbar, without spinal bone injury, sequela ([place]) 08/18/2020  . Paraparesis ([place])   . Post-operative pain   . Hypotension   . Slow transit constipation   . AKI (acute kidney injury) ([place])   . Right leg weakness 08/11/2020  . DDD (degenerative disc disease), lumbar 09/02/2019    Class: Chronic  . Degenerative disc disease, lumbar 09/02/2019  . Chest tightness 09/23/2018  . Bradycardia 09/23/2018  . Labile blood pressure 03/08/2018  . Chronic low back pain 09/03/2017  . History of total hip replacement, left 01/26/2016  . EDEMA 04/28/2008  . LEG PAIN, BILATERAL 12/16/2007  . CERVICAL CANCER 07/02/2007  . MORBID OBESITY 07/02/2007  . DEPRESSION 07/02/2007  . COMMON MIGRAINE 07/02/2007  . ALLERGIC RHINITIS 07/02/2007  . ASTHMA 07/02/2007  . GERD 07/02/2007  . ELEVATED BLOOD PRESSURE WITHOUT DIAGNOSIS OF HYPERTENSION 07/02/2007   Janna Arch, PT, DPT   09/08/2020, 6:15 PM  [place] MAIN [facility] SERVICES [address] [place], [place], [address] Phone: [phone]   Fax:  [phone]  Name: Vanessa Medina MRN: [id number] Date of Birth: 1971-05-09

## 2020-09-10 ENCOUNTER — Other Ambulatory Visit: Payer: Self-pay

## 2020-09-10 ENCOUNTER — Ambulatory Visit: Payer: 59 | Admitting: Cardiology

## 2020-09-10 ENCOUNTER — Encounter: Payer: Self-pay | Admitting: Cardiology

## 2020-09-10 VITALS — BP 122/76 | HR 77 | Resp 16 | Ht 61.0 in | Wt 241.0 lb

## 2020-09-10 DIAGNOSIS — M546 Pain in thoracic spine: Secondary | ICD-10-CM

## 2020-09-10 DIAGNOSIS — G903 Multi-system degeneration of the autonomic nervous system: Secondary | ICD-10-CM

## 2020-09-10 DIAGNOSIS — G8929 Other chronic pain: Secondary | ICD-10-CM

## 2020-09-10 MED ORDER — VITAMIN B-6 100 MG PO TABS: 100.0000 mg | ORAL_TABLET | Freq: Every day | ORAL | 1 refills | Status: AC

## 2020-09-10 MED ORDER — VITAMIN B-12 1000 MCG PO TABS
1000.0000 ug | ORAL_TABLET | Freq: Every day | ORAL | 1 refills | Status: AC
Start: 2020-09-10 — End: ?

## 2020-09-10 MED ORDER — VITAMIN B-1 250 MG PO TABS: 250.0000 mg | ORAL_TABLET | Freq: Every day | ORAL | 1 refills | Status: DC

## 2020-09-10 NOTE — Therapy (Signed)
[place] MAIN [facility] SERVICES [address] [place], [place], [address] Phone: [phone]   Fax:  [phone]  Occupational Therapy Treatment  Patient Details  Name: Vanessa Medina MRN: [id number] Date of Birth: 1971-01-06 No data recorded  Encounter Date: 09/08/2020   OT End of Session - 09/10/20 1424    Visit Number 2    Number of Visits 24    Date for OT Re-Evaluation 11/29/20    Authorization Type Progress report period starting 09/06/2020    OT Start Time 0930    OT Stop Time 1015    OT Time Calculation (min) 45 min    Activity Tolerance Patient tolerated treatment well    Behavior During Therapy [facility] for tasks assessed/performed           Past Medical History:  Diagnosis Date  . Anemia   . Cervical cancer ([place])   . Family history of adverse reaction to anesthesia     " my mother takes a long time time wake up"  . GERD (gastroesophageal reflux disease)   . Headache    migraine  . History of shingles   . Hypertension   . Migraine   . Multiple allergies   . Obesity   . Osteoarthritis    left hip  . PONV (postoperative nausea and vomiting)   . Sleep apnea    does not wear CPAP  . Wears glasses     Past Surgical History:  Procedure Laterality Date  . ABDOMINAL HYSTERECTOMY    . APPENDECTOMY    . BILIOPANCREATIC DIVERSION     with duodenal switch laparoscopic   . CHOLECYSTECTOMY    . DIAGNOSTIC LAPAROSCOPY     LOA  . ESOPHAGOGASTRODUODENOSCOPY (EGD) WITH PROPOFOL N/A 07/25/2017   Procedure: ESOPHAGOGASTRODUODENOSCOPY (EGD) WITH PROPOFOL;  Surgeon: Manya Silvas, MD;  Location: [facility] ENDOSCOPY;  Service: Endoscopy;  Laterality: N/A;  . KNEE ARTHROSCOPY    . LAPAROSCOPIC GASTRIC SLEEVE RESECTION WITH HIATAL HERNIA REPAIR    . TONSILLECTOMY    . TOTAL HIP ARTHROPLASTY Left 01/26/2016   Procedure: LEFT TOTAL HIP ARTHROPLASTY ANTERIOR APPROACH;  Surgeon: Leandrew Koyanagi, MD;  Location: [place];  Service: Orthopedics;   Laterality: Left;  . TRANSFORAMINAL LUMBAR INTERBODY FUSION (TLIF) WITH PEDICLE SCREW FIXATION 3 LEVEL  08/2019   Basil Dess, MD L2-L5    There were no vitals filed for this visit.   Subjective Assessment - 09/10/20 1423    Subjective  Pt reports she spent her birthday and Christmas this year.    Pertinent History Pt. is a 50 y.o. female who had a Spinal cord stimular trial placement in November 2021. Pt. was admitted for Spinal cord stimulator placement on 08/11/2020. Pt. sustained a spinal cord Injury T12-L1, CVA. Pt. received inpatient rehab services.    Currently in Pain? Yes    Pain Score 3     Pain Location Back    Pain Orientation Right    Pain Descriptors / Indicators Aching             Pt reports she is doing well, ready to get started with therapy.  Pt seen for strengthening with use of Dowel 1# for shoulder flexion, ABD, ADD, chest press, forwards and backwards circles and diagonal patterns for 2 sets of 10 reps each exercise with therapist demo and cues for proper form and technique.   Grip strengthening with resistance set on  2nd and 3rd settings, 11# and 17# for 20  reps for one set.   Neuro: Manipulation of coins from flat surface to pick up one by one and place into resistive bank, progressed to picking up and moving item through the hand with translatory movements and then using the hand for storage.    Pt reports she will need to focus on keying in numbers for accounts, money, up to 12 numbers at a time for her job at [employer].    Response to tx: Patient seen for initial treatment session this date and performed well with strengthening and ROM tasks.  Required therapist demo and cues for proper form and technique for exercises and manipulation skills. Will plan to work towards improving hand skills to complete work related tasks with use of computer, keying in numbers for accounts and money.  Continue OT to maximize safety and independence in necessary daily tasks.                            OT Long Term Goals - 09/06/20 1629      OT LONG TERM GOAL #1   Title Pt. will improve FOTO score by 2 points to improve Focus on functional therapeutic outcomes  for FOTO related daily tasks.    Baseline Eval: FOTO score 54    Time 12    Period Weeks    Status New    Target Date 11/29/20      OT LONG TERM GOAL #2   Title Pt. will independently, and efficiently type email correspondence inpreparation for work repated typing tasks.    Baseline Eval: Pt. has difficulty typing  accurately, and efficiently    Time 12    Period Weeks    Status New    Target Date 11/29/20      OT LONG TERM GOAL #3   Title Pt. will independently use scissors to accuraltely cut fabric for crafting projects.    Baseline Eval: pt. has difficulty using scissors for  cutting  fabric staright.    Time 12    Period Weeks    Status New    Target Date 11/29/20      OT LONG TERM GOAL #4   Title Pt. will improve activity tolerance to be able to complete light home making tasks with modified independence.    Baseline Eva: Pt. is a ble to initiate, and perform parts of making tasks with rest breaks.    Time 12    Period Weeks    Status New    Target Date 11/29/20      OT LONG TERM GOAL #5   Title Pt. will perform light meal perparation efficiently with modifed independence.    Baseline EVal: Pt. has difficulty    Time 12    Period Weeks    Status New    Target Date 11/29/20                 Plan - 09/10/20 1424    Clinical Impression Statement Patient seen for initial treatment session this date and performed well with strengthening and ROM tasks.  Required therapist demo and cues for proper form and technique for exercises and manipulation skills. Will plan to work towards improving hand skills to complete work related tasks with use of computer, keying in numbers for accounts and money.  Continue OT to maximize safety and independence in necessary  daily tasks.    Occupational performance deficits (Please refer to evaluation for details): ADL's;IADL's  Rehab Potential Good    Clinical Decision Making Several treatment options, min-mod task modification necessary    Comorbidities Affecting Occupational Performance: May have comorbidities impacting occupational performance    Modification or Assistance to Complete Evaluation  Min-Moderate modification of tasks or assist with assess necessary to complete eval    OT Frequency 2x / week    OT Duration 12 weeks    OT Treatment/Interventions Self-care/ADL training;Therapeutic exercise;Patient/family education;DME and/or AE instruction;Neuromuscular education    Consulted and Agree with Plan of Care Patient           Patient will benefit from skilled therapeutic intervention in order to improve the following deficits and impairments:           Visit Diagnosis: Muscle weakness (generalized)  Other lack of coordination  Spinal cord injury, lumbar, without spinal bone injury, sequela [facility])    Problem List Patient Active Problem List   Diagnosis Date Noted  . Chronic migraine without aura without status migrainosus, not intractable   . Hypokalemia   . Adjustment reaction with anxiety and depression   . Spinal cord injury, lumbar, without spinal bone injury, sequela ([place]) 08/18/2020  . Paraparesis ([place])   . Post-operative pain   . Hypotension   . Slow transit constipation   . AKI (acute kidney injury) ([place])   . Right leg weakness 08/11/2020  . DDD (degenerative disc disease), lumbar 09/02/2019    Class: Chronic  . Degenerative disc disease, lumbar 09/02/2019  . Chest tightness 09/23/2018  . Bradycardia 09/23/2018  . Labile blood pressure 03/08/2018  . Chronic low back pain 09/03/2017  . History of total hip replacement, left 01/26/2016  . EDEMA 04/28/2008  . LEG PAIN, BILATERAL 12/16/2007  . CERVICAL CANCER 07/02/2007  . MORBID OBESITY 07/02/2007  . DEPRESSION  07/02/2007  . COMMON MIGRAINE 07/02/2007  . ALLERGIC RHINITIS 07/02/2007  . ASTHMA 07/02/2007  . GERD 07/02/2007  . ELEVATED BLOOD PRESSURE WITHOUT DIAGNOSIS OF HYPERTENSION 07/02/2007   Alecxander Mainwaring T Baudelia Schroepfer, OTR/L, CLT Elayah Klooster 09/10/2020, 2:34 PM  [place] MAIN [facility] SERVICES [address] [place], [place], [address] Phone: [phone]   Fax:  [phone]  Name: NIYAH MAMARIL MRN: [id number] Date of Birth: 05/08/1971

## 2020-09-10 NOTE — Progress Notes (Signed)
Primary Physician/Referring:  [place] Pink, MD  Patient ID: Vanessa Medina, female    DOB: Mar 03, 1971, 50 y.o.   MRN: [id number]  Chief Complaint  Patient presents with  . Orthostatic Hypotension  . New Patient (Initial Visit)   HPI:    Vanessa Medina  is a 50 y.o. with chronic dizziness, recent diagnosis of postural orthostatic tachycardia syndrome, morbid obesity, history of gastric sleeve procedure in 2015 and revision in 2019, presents to establish care, previously seen by Dr. Ida Rogue.  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  She had to undergo rehabilitation and eventually discharged home 3 weeks later.  She is now referred to me by Reesa Chew, PA-C for evaluation of postural orthostatic tachycardia syndrome.  She was started on Florinef 0.2 mg 3 times daily, patient previously has been on midodrine 10 mg 3 times daily.  Symptoms of orthostasis started about 4 years ago to 5 years ago.  She is presently wearing support stockings regularly, symptoms of dizziness are stable, denies chest pain or palpitations.  Continues to have severe back pain and she is trying to avoid taking pain medications.  She is also made lifestyle changes again and is trying to lose weight and has lost about close to 100 pounds in the past year.  Past Medical History:  Diagnosis Date  . Anemia   . Cervical cancer ([place])   . Family history of adverse reaction to anesthesia     " my mother takes a long time time wake up"  . GERD (gastroesophageal reflux disease)   . Headache    migraine  . History of shingles   . Hypertension   . Migraine   . Multiple allergies   . Obesity   . Osteoarthritis    left hip  . PONV (postoperative nausea and vomiting)   .  Sleep apnea    does not wear CPAP  . Wears glasses    Past Surgical History:  Procedure Laterality Date  . ABDOMINAL HYSTERECTOMY    . APPENDECTOMY    . BILIOPANCREATIC DIVERSION     with duodenal switch laparoscopic   . CHOLECYSTECTOMY    . DIAGNOSTIC LAPAROSCOPY     LOA  . ESOPHAGOGASTRODUODENOSCOPY (EGD) WITH PROPOFOL N/A 07/25/2017   Procedure: ESOPHAGOGASTRODUODENOSCOPY (EGD) WITH PROPOFOL;  Surgeon: Manya Silvas, MD;  Location: [facility] ENDOSCOPY;  Service: Endoscopy;  Laterality: N/A;  . KNEE ARTHROSCOPY    . LAPAROSCOPIC GASTRIC SLEEVE RESECTION WITH HIATAL HERNIA REPAIR    . TONSILLECTOMY    . TOTAL HIP ARTHROPLASTY Left 01/26/2016   Procedure: LEFT TOTAL HIP ARTHROPLASTY ANTERIOR APPROACH;  Surgeon: Leandrew Koyanagi, MD;  Location: [place];  Service: Orthopedics;  Laterality: Left;  . TRANSFORAMINAL LUMBAR INTERBODY FUSION (TLIF) WITH PEDICLE SCREW FIXATION 3 LEVEL  08/2019   Basil Dess, MD L2-L5   Family History  Problem Relation Age of Onset  . Renal Disease Mother   . Hypertension Mother   . Sudden Cardiac Death Mother   . Heart failure Mother   . Valvular heart disease Mother   . Heart disease Mother   . Stroke Brother   . Heart attack Brother 20  . Diabetes Other   . Breast cancer Cousin  paternal side    Social History   Tobacco Use  . Smoking status: Never Smoker  . Smokeless tobacco: Never Used  Substance Use Topics  . Alcohol use: Not Currently    Comment: occasional wine   Marital Status: Divorced   ROS  Review of Systems  Cardiovascular: Negative for chest pain, dyspnea on exertion and leg swelling.  Musculoskeletal: Positive for arthritis, back pain and muscle weakness.  Gastrointestinal: Negative for melena.  Neurological: Positive for dizziness.   Objective  Blood pressure 122/76, pulse 77, resp. rate 16, height 5\' 1"  (1.549 m), weight 241 lb (109.3 kg), SpO2 96 %. Body mass index is 45.54 kg/m.   Vitals with BMI 09/10/2020 09/01/2020  08/31/2020  Height 5\' 1"  - -  Weight 241 lbs - -  BMI 52.77 - -  Systolic 824 235 361  Diastolic 76 66 58  Pulse 77 66 65    Orthostatic VS for the past 72 hrs (Last 3 readings):  Orthostatic BP Patient Position BP Location Cuff Size Orthostatic Pulse  09/10/20 1055 123/70 Standing Left Arm Large 90  09/10/20 1054 116/73 Sitting Left Arm Large 81  09/10/20 1052 128/73 Supine Left Arm Large 78     Physical Exam Constitutional:      Comments: Morbidly obese in no acute distress.  Cardiovascular:     Rate and Rhythm: Normal rate and regular rhythm.     Pulses:          Carotid pulses are 2+ on the right side and 2+ on the left side.      Dorsalis pedis pulses are 2+ on the right side and 2+ on the left side.       Posterior tibial pulses are 2+ on the right side and 2+ on the left side.     Heart sounds: Normal heart sounds. No murmur heard. No gallop.      Comments: Femoral and popliteal pulse difficult to feel due to patient's body habitus.  No leg edema. JVD difficult to see due to short neck. Pulmonary:     Effort: Pulmonary effort is normal.     Breath sounds: Normal breath sounds.  Abdominal:     General: Bowel sounds are normal.     Palpations: Abdomen is soft.     Comments: Obese. Pannus present    Laboratory examination:   Recent Labs    08/24/20 0420 08/26/20 0407 08/30/20 0459  NA 139 139 139  K 3.9 3.7 3.6  CL 109 107 109  CO2 20* 22 20*  GLUCOSE 83 92 92  BUN 23* 18 13  CREATININE 0.98 0.99 0.95  CALCIUM 9.3 8.9 8.8*  GFRNONAA >60 >60 >60   estimated creatinine clearance is 81 mL/min (by C-G formula based on SCr of 0.95 mg/dL).  CMP Latest Ref Rng & Units 08/30/2020 08/26/2020 08/24/2020  Glucose 70 - 99 mg/dL 92 92 83  BUN 6 - 20 mg/dL 13 18 23(H)  Creatinine 0.44 - 1.00 mg/dL 0.95 0.99 0.98  Sodium 135 - 145 mmol/L 139 139 139  Potassium 3.5 - 5.1 mmol/L 3.6 3.7 3.9  Chloride 98 - 111 mmol/L 109 107 109  CO2 22 - 32 mmol/L 20(L) 22 20(L)   Calcium 8.9 - 10.3 mg/dL 8.8(L) 8.9 9.3  Total Protein 6.5 - 8.1 g/dL - - -  Total Bilirubin 0.3 - 1.2 mg/dL - - -  Alkaline Phos 38 - 126 U/L - - -  AST 15 - 41 U/L - - -  ALT 0 - 44 U/L - - -   CBC Latest Ref Rng & Units 08/30/2020 08/23/2020 08/19/2020  WBC 4.0 - 10.5 K/uL 4.4 7.4 8.6  Hemoglobin 12.0 - 15.0 g/dL 9.3(L) 10.3(L) 10.8(L)  Hematocrit 36.0 - 46.0 % 28.0(L) 32.1(L) 32.8(L)  Platelets 150 - 400 K/uL 202 170 164    Lipid Panel No results for input(s): CHOL, TRIG, LDLCALC, VLDL, HDL, CHOLHDL, LDLDIRECT in the last 8760 hours.  HEMOGLOBIN A1C Lab Results  Component Value Date   HGBA1C 5.1 06/24/2013   TSH Recent Labs    08/26/20 0407  TSH 1.879    External labs:   NA  Medications and allergies   Allergies  Allergen Reactions  . Shellfish Allergy Anaphylaxis  . Meperidine Hcl Other (See Comments)    BRADYCARDIA  . Morphine Other (See Comments)    BRADYCARDIA  . Bee Pollen Other (See Comments)    Unknown  . Ivp Dye [Iodinated Diagnostic Agents] Other (See Comments)    Swelling   . Oxycodone Hives and Itching    Blisters on back  . Propoxyphene Nausea Only and Nausea And Vomiting  . Codeine Nausea And Vomiting  . Oxycodone-Acetaminophen Itching  . Pentazocine Lactate Nausea And Vomiting    REACTION: vomiting with [place] NX  . Propoxyphene N-Acetaminophen Nausea And Vomiting     Outpatient Medications Prior to Visit  Medication Sig Dispense Refill  . fludrocortisone (FLORINEF) 0.1 MG tablet Take 2 tablets (0.2 mg total) by mouth at bedtime.    . gabapentin (NEURONTIN) 400 MG capsule Take 3 capsules (1,200 mg total) by mouth at bedtime. 90 capsule 0  . HYDROmorphone (DILAUDID) 4 MG tablet Take 1 tablet (4 mg total) by mouth every 8 (eight) hours as needed for severe pain. 24 tablet 0  . metaxalone (SKELAXIN) 800 MG tablet Take 1 tablet (800 mg total) by mouth 3 (three) times daily as needed for muscle spasms. 90 tablet 0  . metoCLOPramide (REGLAN)  5 MG tablet Take 1 tablet (5 mg total) by mouth 3 (three) times daily before meals. For one week, then decrease to twice a day for a week, then once a day till gone. 42 tablet 0  . Multiple Vitamin (MULTIVITAMIN WITH MINERALS) TABS tablet Take 1 tablet by mouth 2 (two) times daily.    . pantoprazole (PROTONIX) 20 MG tablet Take 1 tablet (20 mg total) by mouth daily. 30 tablet 0  . polyethylene glycol (MIRALAX / GLYCOLAX) 17 g packet Take 17 g by mouth daily. 14 each 0  . pramipexole (MIRAPEX) 0.125 MG tablet Take 0.5 mg by mouth at bedtime.    . topiramate (TOPAMAX) 100 MG tablet Take 1 tablet (100 mg total) by mouth at bedtime. 30 tablet 0  . traZODone (DESYREL) 50 MG tablet Take 0.5-1 tablets (25-50 mg total) by mouth at bedtime as needed for sleep. 15 tablet 0  . fludrocortisone (FLORINEF) 0.1 MG tablet Take 2 tablets (0.2 mg total) by mouth daily. (Patient taking differently: Take 0.2 mg by mouth in the morning, at noon, and at bedtime.) 60 tablet 0  . fludrocortisone (FLORINEF) 0.1 MG tablet Take 0.2 mg by mouth in the morning, at noon, and at bedtime.    . midodrine (PROAMATINE) 10 MG tablet Take 1 tablet (10 mg total) by mouth 3 (three) times daily. 270 tablet 3  . midodrine (PROAMATINE) 10 MG tablet Take 1 tablet (10 mg total) by mouth 3 (three) times daily. While awake. Can take one additional if dizziness  270 tablet 3  . Lifitegrast (XIIDRA) 5 % SOLN Place 1 drop into both eyes in the morning and at bedtime.     . diphenhydrAMINE (BENADRYL) capsule 25 mg      No facility-administered medications prior to visit.    Radiology:   No results found.  Cardiac Studies:   Echocardiogram 08/14/2018: - Left ventricle: The cavity size was normal. Wall thickness was   increased in a pattern of mild LVH. Systolic function was normal.   The estimated ejection fraction was in the range of 55% to 60%.   Wall motion was normal; there were no regional wall motion   abnormalities. Left ventricular  diastolic function parameters   were normal. - Right ventricle: The cavity size was normal. Wall thickness was   normal. Systolic function was normal.   EKG:     EKG 09/10/2020: Normal sinus rhythm at rate of 66 bpm, normal axis, no evidence of ischemia, normal EKG.      Assessment     ICD-10-CM   1. Neurogenic orthostatic hypotension (HCC)  G90.3 EKG 12-Lead    midodrine (PROAMATINE) 10 MG tablet    fludrocortisone (FLORINEF) 0.1 MG tablet    Thiamine HCl (VITAMIN B-1) 250 MG tablet    vitamin B-12 (CYANOCOBALAMIN) 1000 MCG tablet    pyridOXINE (VITAMIN B-6) 100 MG tablet  2. Chronic midline thoracic back pain  M54.6    G89.29   3. Class 3 severe obesity due to excess calories without serious comorbidity with body mass index (BMI) of 45.0 to 49.9 in adult [facility])  E66.01    Z68.42      Medications Discontinued During This Encounter  Medication Reason  . Lifitegrast (XIIDRA) 5 % SOLN No longer needed (for PRN medications)  . diphenhydrAMINE (BENADRYL) capsule 25 mg Error  . fludrocortisone (FLORINEF) 0.1 MG tablet Error  . fludrocortisone (FLORINEF) 0.1 MG tablet Dose change  . midodrine (PROAMATINE) 10 MG tablet Reorder    Meds ordered this encounter  Medications  . Thiamine HCl (VITAMIN B-1) 250 MG tablet    Sig: Take 1 tablet (250 mg total) by mouth daily.    Dispense:  90 tablet    Refill:  1  . vitamin B-12 (CYANOCOBALAMIN) 1000 MCG tablet    Sig: Take 1 tablet (1,000 mcg total) by mouth daily.    Dispense:  90 tablet    Refill:  1  . pyridOXINE (VITAMIN B-6) 100 MG tablet    Sig: Take 1 tablet (100 mg total) by mouth daily.    Dispense:  90 tablet    Refill:  1   Orders Placed This Encounter  Procedures  . EKG 12-Lead    Recommendations:   TATA TIMMINS is a 50 y.o. with chronic dizziness diagnosis of orthostatic hypotension about 4 to 5 years ago,  diagnosis of postural orthostatic tachycardia syndrome in December 2021, morbid obesity, history of  gastric sleeve procedure in 2015 and revision in 2019, presents to establish care, previously seen by Dr. Ida Rogue.  She was admitted in December 6754 after complication trying to place a spinal stimulator and since then has had severe pain in her right leg and weakness from possible spinal stroke.  I reviewed her records, she does not have POTS.  She does not have tachycardia component of POTS.  Suspect she may have developed peripheral neuropathy related to nutritional deficiency after her bariatric procedure as timeline appears to fit this diagnosis.  I have advised her to try  high-dose B12, B6 and B1.  I discussed with her regarding medications that can make her orthostasis worse including pain medications, Desyrel, Skelaxin, hydromorphone.  Patient would like to come off of most of the medications.  I changed her Florinef from 0.2 mg 3 times daily to 0.2 mg in the evening, she will slowly wean Florinef to the lower dose over the next 2 weeks.  She will continue with midodrine and advised her to take extra dose if she needs while awake if she has any dizzy episodes.  She will continue to wear support stockings.  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  I will see her back in 3 months for follow-up of orthostatic hypotension and would like to reduce Florinef to 0.1 mg in the evening if possible.  This was a 70-minute office visit encounter with review of prior office visit records, hospitalization records and coordination of care.    Adrian Prows, MD, [facility] 09/10/2020, 12:37 PM Office: [phone]  CC: PCP, Algis Liming, PA

## 2020-09-13 ENCOUNTER — Encounter: Payer: Self-pay | Admitting: Occupational Therapy

## 2020-09-13 ENCOUNTER — Other Ambulatory Visit: Payer: Self-pay

## 2020-09-13 ENCOUNTER — Ambulatory Visit: Payer: 59 | Admitting: Occupational Therapy

## 2020-09-13 DIAGNOSIS — R278 Other lack of coordination: Secondary | ICD-10-CM | POA: Diagnosis not present

## 2020-09-13 DIAGNOSIS — M6281 Muscle weakness (generalized): Secondary | ICD-10-CM

## 2020-09-13 NOTE — Therapy (Signed)
[place] MAIN [facility] SERVICES [address] [place], [place], [address] Phone: [phone]   Fax:  [phone]  Occupational Therapy Treatment  Patient Details  Name: Vanessa Medina MRN: [id number] Date of Birth: 05-04-1971 No data recorded  Encounter Date: 09/13/2020   OT End of Session - 09/13/20 1708    Visit Number 3    Number of Visits 24    Date for OT Re-Evaluation 11/29/20    Authorization Type Progress report period starting 09/06/2020    OT Start Time 1100    OT Stop Time 1145    OT Time Calculation (min) 45 min    Activity Tolerance Patient tolerated treatment well    Behavior During Therapy [facility] for tasks assessed/performed           Past Medical History:  Diagnosis Date  . Anemia   . Cervical cancer ([place])   . Family history of adverse reaction to anesthesia     " my mother takes a long time time wake up"  . GERD (gastroesophageal reflux disease)   . Headache    migraine  . History of shingles   . Hypertension   . Migraine   . Multiple allergies   . Obesity   . Osteoarthritis    left hip  . PONV (postoperative nausea and vomiting)   . Sleep apnea    does not wear CPAP  . Wears glasses     Past Surgical History:  Procedure Laterality Date  . ABDOMINAL HYSTERECTOMY    . APPENDECTOMY    . BILIOPANCREATIC DIVERSION     with duodenal switch laparoscopic   . CHOLECYSTECTOMY    . DIAGNOSTIC LAPAROSCOPY     LOA  . ESOPHAGOGASTRODUODENOSCOPY (EGD) WITH PROPOFOL N/A 07/25/2017   Procedure: ESOPHAGOGASTRODUODENOSCOPY (EGD) WITH PROPOFOL;  Surgeon: Manya Silvas, MD;  Location: [facility] ENDOSCOPY;  Service: Endoscopy;  Laterality: N/A;  . KNEE ARTHROSCOPY    . LAPAROSCOPIC GASTRIC SLEEVE RESECTION WITH HIATAL HERNIA REPAIR    . TONSILLECTOMY    . TOTAL HIP ARTHROPLASTY Left 01/26/2016   Procedure: LEFT TOTAL HIP ARTHROPLASTY ANTERIOR APPROACH;  Surgeon: Leandrew Koyanagi, MD;  Location: [place];  Service: Orthopedics;   Laterality: Left;  . TRANSFORAMINAL LUMBAR INTERBODY FUSION (TLIF) WITH PEDICLE SCREW FIXATION 3 LEVEL  08/2019   Basil Dess, MD L2-L5    There were no vitals filed for this visit.   Subjective Assessment - 09/13/20 1706    Subjective  Pt. reports that she did some chores at home befoe coming to therapy.    Pertinent History Pt. is a 50 y.o. female who had a Spinal cord stimular trial placement in November 2021. Pt. was admitted for Spinal cord stimulator placement on 08/11/2020. Pt. sustained a spinal cord Injury T12-L1, CVA. Pt. received inpatient rehab services.    Currently in Pain? Yes    Pain Score 3     Pain Location Back    Pain Descriptors / Indicators Aching          OT TREATMENT    Therapeutic Exercise:  Pt. performed gross gripping with grip strengthener. Pt. worked on sustaining grip while grasping pegs and reaching at various heights. The gripper was placed at 11.2#, and 17.9# of force.  Pt. worked on  pink thearputty ex. for right hand strengthening. Exercises included: gross gripping, gross digit extension, thumb abduction, lateral, and 3pt. Pinch strengthening, digit abduction, and thumb opposition. Pt. was provided with a visual handout HEP Pt.  worked on Chief Operating Officer in the left hand for lateral, and 3pt. pinch using yellow, red, and green resistive clips. Pt. worked on placing the clips at various vertical and horizontal angles. Tactile and verbal cues were required for eliciting the desired movement.   Pt. reports that she had an outing yesterday, and went to 3 places including: Church, lunch, and [employer]. Pt. reports that she was extremely fatigued following it, but was encouraged that she was able to make it to 3 places in one day. Pt. continues to present with limited RUE strength, endurance, motor control, and [facility] skills. Pt. Required verbal cues, assist, and visual demonstration for hand position during the therapy exercises, and when performing pinch  strengthening. Pt. Continues to work on improving UE strength, and [facility] skills in order to increase engagement in ADLs, and IADL tasks.                           OT Education - 09/13/20 1707    Education Details Theraputty HEP    Person(s) Educated Patient    Methods Explanation    Comprehension Verbalized understanding               OT Long Term Goals - 09/06/20 1629      OT LONG TERM GOAL #1   Title Pt. will improve FOTO score by 2 points to improve Focus on functional therapeutic outcomes  for FOTO related daily tasks.    Baseline Eval: FOTO score 54    Time 12    Period Weeks    Status New    Target Date 11/29/20      OT LONG TERM GOAL #2   Title Pt. will independently, and efficiently type email correspondence inpreparation for work repated typing tasks.    Baseline Eval: Pt. has difficulty typing  accurately, and efficiently    Time 12    Period Weeks    Status New    Target Date 11/29/20      OT LONG TERM GOAL #3   Title Pt. will independently use scissors to accuraltely cut fabric for crafting projects.    Baseline Eval: pt. has difficulty using scissors for  cutting  fabric staright.    Time 12    Period Weeks    Status New    Target Date 11/29/20      OT LONG TERM GOAL #4   Title Pt. will improve activity tolerance to be able to complete light home making tasks with modified independence.    Baseline Eva: Pt. is a ble to initiate, and perform parts of making tasks with rest breaks.    Time 12    Period Weeks    Status New    Target Date 11/29/20      OT LONG TERM GOAL #5   Title Pt. will perform light meal perparation efficiently with modifed independence.    Baseline EVal: Pt. has difficulty    Time 12    Period Weeks    Status New    Target Date 11/29/20                 Plan - 09/13/20 1708    Clinical Impression Statement Pt. reports that she had an outing yesterday, and went to 3 places including: Church, lunch,  and [employer]. Pt. reports that she was extremely fatigued following it, but was encouraged that she was able to make it to 3 places in one day. Pt.  continues to present with limited RUE strength, endurance, motor control, and [facility] skills. Pt. Required verbal cues, assist, and visual demonstration for hand position during the therapy exercises, and when performing pinch strengthening. Pt. Continues to work on improving UE strength, and [facility] skills in order to increase engagement in ADLs, and IADL tasks.      Occupational performance deficits (Please refer to evaluation for details): ADL's;IADL's    Rehab Potential Good    Clinical Decision Making Several treatment options, min-mod task modification necessary    Comorbidities Affecting Occupational Performance: May have comorbidities impacting occupational performance    Modification or Assistance to Complete Evaluation  Min-Moderate modification of tasks or assist with assess necessary to complete eval    OT Frequency 2x / week    OT Duration 12 weeks    OT Treatment/Interventions Self-care/ADL training;Therapeutic exercise;Patient/family education;DME and/or AE instruction;Neuromuscular education    Consulted and Agree with Plan of Care Patient           Patient will benefit from skilled therapeutic intervention in order to improve the following deficits and impairments:           Visit Diagnosis: Muscle weakness (generalized)  Other lack of coordination    Problem List Patient Active Problem List   Diagnosis Date Noted  . Chronic migraine without aura without status migrainosus, not intractable   . Hypokalemia   . Adjustment reaction with anxiety and depression   . Spinal cord injury, lumbar, without spinal bone injury, sequela ([place]) 08/18/2020  . Paraparesis ([place])   . Post-operative pain   . Hypotension   . Slow transit constipation   . AKI (acute kidney injury) ([place])   . Right leg weakness 08/11/2020  . DDD  (degenerative disc disease), lumbar 09/02/2019    Class: Chronic  . Degenerative disc disease, lumbar 09/02/2019  . Chest tightness 09/23/2018  . Bradycardia 09/23/2018  . Labile blood pressure 03/08/2018  . Chronic low back pain 09/03/2017  . History of total hip replacement, left 01/26/2016  . EDEMA 04/28/2008  . LEG PAIN, BILATERAL 12/16/2007  . CERVICAL CANCER 07/02/2007  . MORBID OBESITY 07/02/2007  . DEPRESSION 07/02/2007  . COMMON MIGRAINE 07/02/2007  . ALLERGIC RHINITIS 07/02/2007  . ASTHMA 07/02/2007  . GERD 07/02/2007  . ELEVATED BLOOD PRESSURE WITHOUT DIAGNOSIS OF HYPERTENSION 07/02/2007    Harrel Carina, MS, OTR/L 09/13/2020, 5:11 PM  [place] MAIN [facility] SERVICES [address] [place], [place], [address] Phone: [phone]   Fax:  [phone]  Name: Vanessa Medina MRN: [id number] Date of Birth: [birth year]

## 2020-09-14 ENCOUNTER — Ambulatory Visit: Payer: Self-pay

## 2020-09-14 ENCOUNTER — Ambulatory Visit: Payer: 59 | Admitting: Orthopaedic Surgery

## 2020-09-14 VITALS — Ht 61.0 in | Wt 241.0 lb

## 2020-09-14 DIAGNOSIS — M25561 Pain in right knee: Secondary | ICD-10-CM | POA: Diagnosis not present

## 2020-09-14 MED ORDER — METHYLPREDNISOLONE ACETATE 40 MG/ML IJ SUSP
40.0000 mg | INTRAMUSCULAR | Status: AC | PRN
  Administered 2020-09-14: 40 mg via INTRA_ARTICULAR

## 2020-09-14 MED ORDER — BUPIVACAINE HCL 0.25 % IJ SOLN
2.0000 mL | INTRAMUSCULAR | Status: AC | PRN
  Administered 2020-09-14: 2 mL via INTRA_ARTICULAR

## 2020-09-14 MED ORDER — LIDOCAINE HCL 1 % IJ SOLN
2.0000 mL | INTRAMUSCULAR | Status: AC | PRN
  Administered 2020-09-14: 2 mL

## 2020-09-14 NOTE — Progress Notes (Signed)
Office Visit Note   Patient: Vanessa Medina           Date of Birth: 1971-02-05           MRN: [id number] Visit Date: 09/14/2020              Requested by: Maryland Pink, MD [address] [place],  [place] [address] PCP: Maryland Pink, MD   Assessment & Plan: Visit Diagnoses:  1. Right knee pain, unspecified chronicity     Plan: Impression is right knee arthritis flareup.  We discussed cortisone injection today for which the patient would like to proceed.  She will call us if she decides she would like viscosupplementation injection.  Follow-Up Instructions: Return if symptoms worsen or fail to improve.   Orders:  Orders Placed This Encounter  Procedures  . Large Joint Inj: R knee  . XR KNEE 3 VIEW RIGHT   No orders of the defined types were placed in this encounter.     Procedures: Large Joint Inj: R knee on 09/14/2020 3:06 PM Indications: pain Details: 22 G needle, anterolateral approach Medications: 2 mL lidocaine 1 %; 2 mL bupivacaine 0.25 %; 40 mg methylPREDNISolone acetate 40 MG/ML      Clinical Data: No additional findings.   Subjective: Chief Complaint  Patient presents with  . Right Knee - Pain    HPI patient is a pleasant 50 year old female who comes in today with right knee pain.  This began about 6 weeks ago after she had a seizure which caused her to fall down a set of stairs and hit the top of her knee on the edge of a concrete step.  She has been having pain to the entire knee since.  She describes this as a constant throb with associated popping.  Any activity with the right knee seems to aggravate her symptoms.  She has had a remote cortisone injection which did seem to help.  No history of viscosupplementation injection.  Of note, she was recently hospitalized for a neurogenic stroke which caused subsequent weakness to the right lower extremity.  She is currently in physical and occupational therapy for this.  Review of  Systems as detailed in HPI.  All others reviewed and are negative.   Objective: Vital Signs: Ht 5\' 1"  (1.549 m)   Wt 241 lb (109.3 kg)   BMI 45.54 kg/m   Physical Exam well-developed and well-nourished female no acute distress.  Alert oriented x3.  Ortho Exam right knee exam shows no effusion.  Active range of motion from about 30 to 90 degrees.  I can passively get her from 0 to about 100.  Medial and lateral joint line tenderness.  She does have tenderness throughout the entire patella.  Ligaments are stable.  Specialty Comments:  No specialty comments available.  Imaging: XR KNEE 3 VIEW RIGHT  Result Date: 09/14/2020 Moderate medial and patellofemoral degenerative changes with periarticular spurring    PMFS History: Patient Active Problem List   Diagnosis Date Noted  . Chronic migraine without aura without status migrainosus, not intractable   . Hypokalemia   . Adjustment reaction with anxiety and depression   . Spinal cord injury, lumbar, without spinal bone injury, sequela ([place]) 08/18/2020  . Paraparesis ([place])   . Post-operative pain   . Hypotension   . Slow transit constipation   . AKI (acute kidney injury) ([place])   . Right leg weakness 08/11/2020  . DDD (degenerative disc disease), lumbar 09/02/2019  Class: Chronic  . Degenerative disc disease, lumbar 09/02/2019  . Chest tightness 09/23/2018  . Bradycardia 09/23/2018  . Labile blood pressure 03/08/2018  . Chronic low back pain 09/03/2017  . History of total hip replacement, left 01/26/2016  . EDEMA 04/28/2008  . LEG PAIN, BILATERAL 12/16/2007  . CERVICAL CANCER 07/02/2007  . MORBID OBESITY 07/02/2007  . DEPRESSION 07/02/2007  . COMMON MIGRAINE 07/02/2007  . ALLERGIC RHINITIS 07/02/2007  . ASTHMA 07/02/2007  . GERD 07/02/2007  . ELEVATED BLOOD PRESSURE WITHOUT DIAGNOSIS OF HYPERTENSION 07/02/2007   Past Medical History:  Diagnosis Date  . Anemia   . Cervical cancer ([place])   . Family history of adverse  reaction to anesthesia     " my mother takes a long time time wake up"  . GERD (gastroesophageal reflux disease)   . Headache    migraine  . History of shingles   . Hypertension   . Migraine   . Multiple allergies   . Obesity   . Osteoarthritis    left hip  . PONV (postoperative nausea and vomiting)   . Sleep apnea    does not wear CPAP  . Wears glasses     Family History  Problem Relation Age of Onset  . Renal Disease Mother   . Hypertension Mother   . Sudden Cardiac Death Mother   . Heart failure Mother   . Valvular heart disease Mother   . Heart disease Mother   . Stroke Brother   . Heart attack Brother 60  . Diabetes Other   . Breast cancer Cousin        paternal side    Past Surgical History:  Procedure Laterality Date  . ABDOMINAL HYSTERECTOMY    . APPENDECTOMY    . BILIOPANCREATIC DIVERSION     with duodenal switch laparoscopic   . CHOLECYSTECTOMY    . DIAGNOSTIC LAPAROSCOPY     LOA  . ESOPHAGOGASTRODUODENOSCOPY (EGD) WITH PROPOFOL N/A 07/25/2017   Procedure: ESOPHAGOGASTRODUODENOSCOPY (EGD) WITH PROPOFOL;  Surgeon: Manya Silvas, MD;  Location: [facility] ENDOSCOPY;  Service: Endoscopy;  Laterality: N/A;  . KNEE ARTHROSCOPY    . LAPAROSCOPIC GASTRIC SLEEVE RESECTION WITH HIATAL HERNIA REPAIR    . TONSILLECTOMY    . TOTAL HIP ARTHROPLASTY Left 01/26/2016   Procedure: LEFT TOTAL HIP ARTHROPLASTY ANTERIOR APPROACH;  Surgeon: Leandrew Koyanagi, MD;  Location: [place];  Service: Orthopedics;  Laterality: Left;  . TRANSFORAMINAL LUMBAR INTERBODY FUSION (TLIF) WITH PEDICLE SCREW FIXATION 3 LEVEL  08/2019   Basil Dess, MD L2-L5   Social History   Occupational History  . Not on file  Tobacco Use  . Smoking status: Never Smoker  . Smokeless tobacco: Never Used  Vaping Use  . Vaping Use: Never used  Substance and Sexual Activity  . Alcohol use: Not Currently    Comment: occasional wine  . Drug use: No  . Sexual activity: Not Currently

## 2020-09-15 ENCOUNTER — Encounter: Payer: 59 | Attending: Registered Nurse | Admitting: Registered Nurse

## 2020-09-15 ENCOUNTER — Encounter: Payer: Self-pay | Admitting: Registered Nurse

## 2020-09-15 ENCOUNTER — Other Ambulatory Visit: Payer: Self-pay

## 2020-09-15 VITALS — BP 133/82 | HR 86 | Temp 98.6°F | Ht 64.0 in | Wt 237.0 lb

## 2020-09-15 DIAGNOSIS — G43709 Chronic migraine without aura, not intractable, without status migrainosus: Secondary | ICD-10-CM | POA: Diagnosis not present

## 2020-09-15 DIAGNOSIS — S34109S Unspecified injury to unspecified level of lumbar spinal cord, sequela: Secondary | ICD-10-CM | POA: Insufficient documentation

## 2020-09-15 DIAGNOSIS — M5416 Radiculopathy, lumbar region: Secondary | ICD-10-CM | POA: Diagnosis present

## 2020-09-15 DIAGNOSIS — G8220 Paraplegia, unspecified: Secondary | ICD-10-CM | POA: Diagnosis not present

## 2020-09-15 MED ORDER — METAXALONE 800 MG PO TABS: 800.0000 mg | ORAL_TABLET | Freq: Three times a day (TID) | ORAL | 0 refills | Status: DC | PRN

## 2020-09-15 NOTE — Progress Notes (Signed)
Subjective:    Patient ID: Vanessa Medina, female    DOB: 1971-07-31, 50 y.o.   MRN: [id number]  HPI: Vanessa Medina is a 50 y.o. female who is here for HFU appointment of her Paraparesis, Spinal Cord Injury, Lumbar, Lumbar Radiculopathy and Chronic Migraine without aura without status migrainous, not intractable. Vanessa Medina was sent to [facility] on 08/11/2020, after experiencing right leg weakness in the pacu at the surgical center by Dr Davy Pique. She was scheduled to have a spinal cord stimulator placed on 08/11/2020, Dr. Davy Pique was unable to perform procedure due to scar tissue. Vanessa Medina awaken in pacu with complaints of excruciating pain down her right lower extremity. Neurosurgery was consulted.  MR Lumbar Spine: MR Thoracic  IMPRESSION: 1. Subtle focus of increased T2/STIR signal intensity with postcontrast enhancement within the right aspect of the distal cord at the level of T12-L1, new from previous exam. Given the history of recent spinal stimulator placement, finding suspicious for a small focus of edema/acute spinal cord injury. 2. No other acute abnormality within the thoracic or lumbar spine. No evidence for CSF leak. 3. Prior PLIF at L2 through L5 without residual or recurrent spinal stenosis. Residual right foraminal protrusion at L3-4, closely approximating and potentially irritating the exiting right L3 nerve root, stable.  Vanessa Medina was admitted to inpatient rehabilitation on 08/18/2020 and discharged home on 08/31/2020. She is receiving Outpatient Therapy at [facility]. She states she has pain in her mid- lower back pain radiating into her right lower extremity. She rates her pain 8. Also reports her appetite is improving.     Pain Inventory Average Pain 6 Pain Right Now 8 My pain is sharp, dull, aching and throbbing  LOCATION OF PAIN  Hand, fingers, back, buttocks, knee, leg  BOWEL Number of stools per week: 7 Oral  laxative use Yes  Type of laxative miralax Enema or suppository use No  History of colostomy No  Incontinent No   BLADDER Normal In and out cath, frequency n/a Able to self cath n/a Bladder incontinence No  Frequent urination No  Leakage with coughing No  Difficulty starting stream No  Incomplete bladder emptying No    Mobility walk with assistance use a cane use a walker how many minutes can you walk? 10 ability to climb steps?  yes do you drive?  no Do you have any goals in this area?  yes  Function employed # of hrs/week 0 disabled: date disabled short term I need assistance with the following:  shopping  Neuro/Psych weakness numbness spasms  Prior Studies transitional care  Physicians involved in your care transitional care   Family History  Problem Relation Age of Onset   Renal Disease Mother    Hypertension Mother    Sudden Cardiac Death Mother    Heart failure Mother    Valvular heart disease Mother    Heart disease Mother    Stroke Brother    Heart attack Brother 28   Diabetes Other    Breast cancer Cousin        paternal side   Social History   Socioeconomic History   Marital status: Divorced    Spouse name: Not on file   Number of children: 1   Years of education: Not on file   Highest education level: Not on file  Occupational History   Not on file  Tobacco Use   Smoking status: Never Smoker   Smokeless tobacco: Never Used  Vaping Use   Vaping Use: Never used  Substance and Sexual Activity   Alcohol use: Not Currently    Comment: occasional wine   Drug use: No   Sexual activity: Not Currently  Other Topics Concern   Not on file  Social History Narrative   Not on file   Social Determinants of Health   Financial Resource Strain: Not on file  Food Insecurity: Not on file  Transportation Needs: Not on file  Physical Activity: Not on file  Stress: Not on file  Social Connections: Not on file   Past  Surgical History:  Procedure Laterality Date   ABDOMINAL HYSTERECTOMY     APPENDECTOMY     BILIOPANCREATIC DIVERSION     with duodenal switch laparoscopic    CHOLECYSTECTOMY     DIAGNOSTIC LAPAROSCOPY     LOA   ESOPHAGOGASTRODUODENOSCOPY (EGD) WITH PROPOFOL N/A 07/25/2017   Procedure: ESOPHAGOGASTRODUODENOSCOPY (EGD) WITH PROPOFOL;  Surgeon: Manya Silvas, MD;  Location: [facility] ENDOSCOPY;  Service: Endoscopy;  Laterality: N/A;   KNEE ARTHROSCOPY     LAPAROSCOPIC GASTRIC SLEEVE RESECTION WITH HIATAL HERNIA REPAIR     TONSILLECTOMY     TOTAL HIP ARTHROPLASTY Left 01/26/2016   Procedure: LEFT TOTAL HIP ARTHROPLASTY ANTERIOR APPROACH;  Surgeon: Leandrew Koyanagi, MD;  Location: [place];  Service: Orthopedics;  Laterality: Left;   TRANSFORAMINAL LUMBAR INTERBODY FUSION (TLIF) WITH PEDICLE SCREW FIXATION 3 LEVEL  08/2019   Basil Dess, MD L2-L5   Past Medical History:  Diagnosis Date   Anemia    Cervical cancer [facility])    Family history of adverse reaction to anesthesia     " my mother takes a long time time wake up"   GERD (gastroesophageal reflux disease)    Headache    migraine   History of shingles    Hypertension    Migraine    Multiple allergies    Obesity    Osteoarthritis    left hip   PONV (postoperative nausea and vomiting)    Sleep apnea    does not wear CPAP   Wears glasses    BP 133/82    Pulse 86    Temp 98.6 F (37 C)    Ht 5\' 4"  (1.626 m)    Wt 237 lb (107.5 kg)    SpO2 98%    BMI 40.68 kg/m   Opioid Risk Score:   Fall Risk Score:  `1  Depression screen PHQ 2/9  No flowsheet data found.  Review of Systems  Constitutional: Positive for unexpected weight change.  HENT: Negative.   Eyes: Negative.   Respiratory: Negative.   Cardiovascular: Negative.   Gastrointestinal: Negative.   Endocrine: Negative.   Genitourinary: Negative.   Musculoskeletal: Positive for arthralgias, back pain and gait problem.       Spasms   Allergic/Immunologic: Negative.   Neurological: Positive for weakness and numbness.  Hematological: Negative.   Psychiatric/Behavioral: Negative.   All other systems reviewed and are negative.      Objective:   Physical Exam Vitals and nursing note reviewed.  Constitutional:      Appearance: Normal appearance. She is obese.  Cardiovascular:     Rate and Rhythm: Normal rate and regular rhythm.     Pulses: Normal pulses.     Heart sounds: Normal heart sounds.  Pulmonary:     Effort: Pulmonary effort is normal.     Breath sounds: Normal breath sounds.  Musculoskeletal:     Cervical back: Normal range  of motion and neck supple.     Comments: Normal Muscle Bulk and Muscle Testing Reveals:  Upper Extremities: Full ROM and Muscle Strength 5/5  Lumbar Hypersensitivity Right Greater Trochanter Tenderness Lower Extremities: Right Lower Extremity: Decreased ROM and Muscle Strength 4/5 Right Lower Extremity Flexion Produces Pain into her right lower extremity Left Lower Extremity: Full ROM and Muscle Strength 5/5 Arises from Table slowly using walker for support Narrow Based  Gait   Skin:    General: Skin is warm and dry.  Neurological:     Mental Status: She is alert and oriented to person, place, and time.  Psychiatric:        Mood and Affect: Mood normal.        Behavior: Behavior normal.           Assessment & Plan:  1. Paraparesis: Continue Outpatient Therapy. Continue current medication regimen. Continue to Monitor.   2.Spinal Cord Injury: She was instructed to call Dr Ronnald Ramp office to schedule HFU appointment , she verbalizes understanding.  3.Lumbar Radiculopathy: Continue current medication regimen. Continue to monitor.  4.Chronic Migraine without aura without status migrainous, not intractable. Continue current medication regimen. PCP following. Continue to monitor.   F/U with Dr Dagoberto Ligas in 4-6 weeks.

## 2020-09-16 ENCOUNTER — Ambulatory Visit: Payer: 59

## 2020-09-16 ENCOUNTER — Telehealth: Payer: Self-pay | Admitting: Registered Nurse

## 2020-09-16 DIAGNOSIS — R278 Other lack of coordination: Secondary | ICD-10-CM | POA: Diagnosis not present

## 2020-09-16 DIAGNOSIS — R2689 Other abnormalities of gait and mobility: Secondary | ICD-10-CM

## 2020-09-16 DIAGNOSIS — M6281 Muscle weakness (generalized): Secondary | ICD-10-CM

## 2020-09-16 DIAGNOSIS — S34109S Unspecified injury to unspecified level of lumbar spinal cord, sequela: Secondary | ICD-10-CM

## 2020-09-16 NOTE — Telephone Encounter (Signed)
Dr. Dagoberto Ligas, this patient was seen on 09/15/2020, she asked when she will be release to drive again? I'm not sure of your recommendations, this was relayed to the patient, she is aware I would be asking you the above question.

## 2020-09-16 NOTE — Therapy (Signed)
[place] [facility] SERVICES [place], [place], [address] Phone: [phone]   Fax:  [phone]  Physical Therapy Treatment  Patient Details  Name: Vanessa Medina MRN: [id number] Date of Birth: [birth year] Referring Provider (PT): Dr. Erling Cruz   Encounter Date: 09/16/2020   PT End of Session - 09/16/20 0944    Visit Number 3    Number of Visits 17    Date for PT Re-Evaluation 10/27/20    Authorization Type eval: 12/29    PT Start Time 0845    PT Stop Time 0929    PT Time Calculation (min) 44 min    Equipment Utilized During Treatment Gait belt    Activity Tolerance Patient tolerated treatment well    Behavior During Therapy [facility] for tasks assessed/performed           Past Medical History:  Diagnosis Date  . Anemia   . Cervical cancer ([place])   . Family history of adverse reaction to anesthesia     " my mother takes a long time time wake up"  . GERD (gastroesophageal reflux disease)   . Headache    migraine  . History of shingles   . Hypertension   . Migraine   . Multiple allergies   . Obesity   . Osteoarthritis    left hip  . PONV (postoperative nausea and vomiting)   . Sleep apnea    does not wear CPAP  . Wears glasses     Past Surgical History:  Procedure Laterality Date  . ABDOMINAL HYSTERECTOMY    . APPENDECTOMY    . BILIOPANCREATIC DIVERSION     with duodenal switch laparoscopic   . CHOLECYSTECTOMY    . DIAGNOSTIC LAPAROSCOPY     LOA  . ESOPHAGOGASTRODUODENOSCOPY (EGD) WITH PROPOFOL N/A 07/25/2017   Procedure: ESOPHAGOGASTRODUODENOSCOPY (EGD) WITH PROPOFOL;  Surgeon: Manya Silvas, MD;  Location: [facility] ENDOSCOPY;  Service: Endoscopy;  Laterality: N/A;  . KNEE ARTHROSCOPY    . LAPAROSCOPIC GASTRIC SLEEVE RESECTION WITH HIATAL HERNIA REPAIR    . TONSILLECTOMY    . TOTAL HIP ARTHROPLASTY Left 01/26/2016   Procedure: LEFT TOTAL HIP ARTHROPLASTY ANTERIOR APPROACH;  Surgeon: Leandrew Koyanagi, MD;   Location: [facility];  Service: Orthopedics;  Laterality: Left;  . TRANSFORAMINAL LUMBAR INTERBODY FUSION (TLIF) WITH PEDICLE SCREW FIXATION 3 LEVEL  08/2019   Basil Dess, MD L2-L5    There were no vitals filed for this visit.   Subjective Assessment - 09/16/20 0943    Subjective Patient went to doctor, is pleased with her weight close. Has been having severe pain in knee so got an injection earlier this week. Still having severe pain.    Pertinent History 50 year old female with history of HTN, migraines, morbid obesity--BMI-41, chronic hypotension, multiple back surgeries with chronic pain with LLE lumbar radiculopathy who was scheduled to have spinal cord stimulator placed by Dr. Davy Pique on 08/11/2020 but unable to perform surgery due to scar tissue.  History taken from patient and chart review.  Post op on awakening, she had excruciating pain RLE with plegia and and allodynia. She was admitted for work up by Dr. Ronnald Ramp from surgical center on 08/11/2020. Thoracic MRI done revealing subtle increased T2/STIR intensity distal cord at T12-L1 level suspicious for edema/acute spinal cord injury, no CSF leak as well as prior PLIF L2-L5 with residual foraminal protrusion L3/5 potentially irritating right L3 nerve. She was started on IV Dilaudid, gabapentin as well as IV  Decadron for management of pain, headaches and severe neuropathy.  She continues to have pain in RLE but is having some motor return, has constant headache, decreased hearing in right> left ear, constipation with nausea as well as epigastric pain and weakness. Therapy ongoing and CIR recommended due to functional decline.  Of note, she reports 50 lbs weigh loss in the past 3-4 months--due to issues with N/V/intake. Has had two falls last month---no recall of incident leading to fall question due to syncope. Was in process of work up for renal disease. She has had issue with LLE weakness with pain for the past year and being followed by Dr. Ernestina Patches. Did  well with stimulator trial.    How long can you sit comfortably? 1 hour    How long can you stand comfortably? 8 mins    How long can you walk comfortably? 20-30 minutes    Currently in Pain? Yes    Pain Score 7     Pain Location Back    Pain Orientation Right    Pain Descriptors / Indicators Aching    Pain Type Neuropathic pain    Pain Onset More than a month ago    Pain Frequency Intermittent            Patient went to doctor, is pleased with her weight close. Has been having severe pain in knee so got an injection earlier this week. Still having severe pain.   Standing next to support surface:  airex pad: 2x 60 second holds  airex pad: 4" step modified tandem stance 2x60 seconds each LE placement; SUE support   4" step toe taps BUE support: slight pain in pelvis reported 8x   Swiss ball seated: -TrA press 10x 3 second hold -TrA press with UE raise 10x each LE  Seated: RTB around ankles  -alternating LAQ 10x each LE -alternating ER/IR 12x each LE  -abduction 15x   Frequent rest breaks due to fatigue, blood pressure monitoring and pain in knees and hips.       Pt educated throughout session about proper posture and technique with exercises. Improved exercise technique, movement at target joints, use of target muscles after min to mod verbal, visual, tactile cues  Patient educated on use of compression stocking over knees rather than below knees to reduce swelling in knees and improve pain levels.                       PT Education - 09/16/20 0944    Education Details exercise technique, body mechanics    Person(s) Educated Patient    Methods Explanation;Demonstration;Tactile cues;Verbal cues    Comprehension Verbalized understanding;Returned demonstration;Verbal cues required;Tactile cues required            PT Short Term Goals - 09/01/20 1349      PT SHORT TERM GOAL #1   Title Pt will be independent with HEP in order to improve strength and  balance in order to decrease fall risk and improve function at home and work.    Time 6    Period Weeks    Status New    Target Date 10/13/20             PT Long Term Goals - 09/01/20 1349      PT LONG TERM GOAL #1   Title Patient (< 32 years old) will complete five times sit to stand test in < 10 seconds indicating an increased LE strength and improved  balance.    Baseline 12/29: 38.45s    Time 12    Period Weeks    Status New    Target Date 11/24/20      PT LONG TERM GOAL #2   Title Pt will decrease TUG to below 14 seconds/decrease in order to demonstrate decreased fall risk.    Baseline 12/29: 32.94s    Time 12    Period Weeks    Status New    Target Date 11/24/20      PT LONG TERM GOAL #3   Title Pt will decrease DHI score by at least 18 points in order to demonstrate clinically significant reduction in disability    Baseline 12/29: 30    Time 12    Period Weeks    Status New    Target Date 11/24/20      PT LONG TERM GOAL #4   Title Patient will increase 10 meter walk test to >1.8m/s as to improve gait speed for better community ambulation and to reduce fall risk.    Baseline 12/29: 0.37 m/s    Time 12    Period Weeks    Status New    Target Date 11/24/20      PT LONG TERM GOAL #5   Title Patient will increase FOTO score to equal to or greater than 64 to demonstrate statistically significant improvement in mobility and quality of life.    Baseline 12/29: 52    Time 12    Period Weeks    Status New    Target Date 11/24/20                 Plan - 09/16/20 0946    Clinical Impression Statement Patient presents to physical therapy with rollator this session. Is motivated to continue progressing her strengthening and balance. She is able to tolerate a 4 inch step this session but does result in increased pain. Rest between each transition required with purse lipped breathing to reduce onset of POTS. Patient notices focusing on a target and taking few deep  breaths helps with maintaining vital signs and activity. Patient presents with deficits in strength, gait and balance. Patient will benefit from skilled PT services to address deficits in balance and decrease risk for future falls.    Personal Factors and Comorbidities Comorbidity 3+;Time since onset of injury/illness/exacerbation    Comorbidities HYPOtension, paraparesis, SCI without spinal bone injury, DDD, cervical cancer, acute kidney injury, GERD, asthma, bradycardia, L total hip replacement, hypokalemia    Examination-Activity Limitations Squat;Lift;Stairs;Bend;Stand;Reach Overhead;Carry;Transfers    Examination-Participation Restrictions Cleaning;Laundry;Meal Prep;Community Activity;Driving;Occupation    Stability/Clinical Decision Making Evolving/Moderate complexity    Rehab Potential Fair    PT Frequency 2x / week    PT Duration 12 weeks    PT Treatment/Interventions ADLs/Self Care Home Management;Aquatic Therapy;Gait training;Stair training;Functional mobility training;Therapeutic activities;Therapeutic exercise;Balance training;Moist Heat;Manual techniques;Patient/family education;Neuromuscular re-education;Passive range of motion;Energy conservation;Vestibular;Joint Manipulations;Spinal Manipulations    PT Next Visit Plan Initiate strengthening exercises    PT Home Exercise Plan Initiate HEP    Consulted and Agree with Plan of Care Patient           Patient will benefit from skilled therapeutic intervention in order to improve the following deficits and impairments:  Abnormal gait,Dizziness,Improper body mechanics,Decreased mobility,Cardiopulmonary status limiting activity,Decreased activity tolerance,Decreased endurance,Decreased range of motion,Decreased strength,Decreased balance,Decreased safety awareness,Difficulty walking,Impaired flexibility,Obesity  Visit Diagnosis: Muscle weakness (generalized)  Other lack of coordination  Spinal cord injury, lumbar, without spinal bone  injury, sequela ([place])  Other abnormalities of gait and mobility     Problem List Patient Active Problem List   Diagnosis Date Noted  . Chronic migraine without aura without status migrainosus, not intractable   . Hypokalemia   . Adjustment reaction with anxiety and depression   . Spinal cord injury, lumbar, without spinal bone injury, sequela ([place]) 08/18/2020  . Paraparesis ([place])   . Post-operative pain   . Hypotension   . Slow transit constipation   . AKI (acute kidney injury) ([place])   . Right leg weakness 08/11/2020  . DDD (degenerative disc disease), lumbar 09/02/2019    Class: Chronic  . Degenerative disc disease, lumbar 09/02/2019  . Chest tightness 09/23/2018  . Bradycardia 09/23/2018  . Labile blood pressure 03/08/2018  . Chronic low back pain 09/03/2017  . History of total hip replacement, left 01/26/2016  . EDEMA 04/28/2008  . LEG PAIN, BILATERAL 12/16/2007  . CERVICAL CANCER 07/02/2007  . MORBID OBESITY 07/02/2007  . DEPRESSION 07/02/2007  . COMMON MIGRAINE 07/02/2007  . ALLERGIC RHINITIS 07/02/2007  . ASTHMA 07/02/2007  . GERD 07/02/2007  . ELEVATED BLOOD PRESSURE WITHOUT DIAGNOSIS OF HYPERTENSION 07/02/2007   Janna Arch, PT, DPT   09/16/2020, 9:46 AM  [place] [facility] SERVICES [address] [place], [place], [address] Phone: [phone]   Fax:  [phone]  Name: Vanessa Medina MRN: [id number] Date of Birth: [birth year]

## 2020-09-17 ENCOUNTER — Telehealth: Payer: Self-pay | Admitting: Specialist

## 2020-09-17 NOTE — Telephone Encounter (Signed)
[employer] forms received. Sent to [employer]. 

## 2020-09-20 ENCOUNTER — Ambulatory Visit: Payer: 59

## 2020-09-20 ENCOUNTER — Ambulatory Visit: Payer: 59 | Admitting: Occupational Therapy

## 2020-09-23 ENCOUNTER — Encounter: Payer: Self-pay | Admitting: Occupational Therapy

## 2020-09-23 ENCOUNTER — Other Ambulatory Visit: Payer: Self-pay

## 2020-09-23 ENCOUNTER — Ambulatory Visit: Payer: 59 | Admitting: Occupational Therapy

## 2020-09-23 ENCOUNTER — Ambulatory Visit: Payer: 59

## 2020-09-23 VITALS — BP 115/66 | HR 71

## 2020-09-23 DIAGNOSIS — R296 Repeated falls: Secondary | ICD-10-CM

## 2020-09-23 DIAGNOSIS — R2689 Other abnormalities of gait and mobility: Secondary | ICD-10-CM

## 2020-09-23 DIAGNOSIS — S34109S Unspecified injury to unspecified level of lumbar spinal cord, sequela: Secondary | ICD-10-CM

## 2020-09-23 DIAGNOSIS — M6281 Muscle weakness (generalized): Secondary | ICD-10-CM

## 2020-09-23 DIAGNOSIS — R2681 Unsteadiness on feet: Secondary | ICD-10-CM

## 2020-09-23 DIAGNOSIS — M5442 Lumbago with sciatica, left side: Secondary | ICD-10-CM

## 2020-09-23 DIAGNOSIS — R278 Other lack of coordination: Secondary | ICD-10-CM | POA: Diagnosis not present

## 2020-09-23 DIAGNOSIS — N281 Cyst of kidney, acquired: Secondary | ICD-10-CM | POA: Insufficient documentation

## 2020-09-23 NOTE — Therapy (Signed)
[place] MAIN [facility] SERVICES [address] [place], [place], [address] Phone: [phone]   Fax:  [phone]  Physical Therapy Treatment  Patient Details  Name: Vanessa Medina MRN: [id number] Date of Birth: 02-Jun-1971 Referring Provider (PT): Dr. Erling Cruz   Encounter Date: 09/23/2020   PT End of Session - 09/23/20 1051    Visit Number 4    Number of Visits 17    Date for PT Re-Evaluation 10/27/20    Authorization Type eval: 12/29    PT Start Time 0931    PT Stop Time 1022    PT Time Calculation (min) 51 min    Equipment Utilized During Treatment Back brace    Activity Tolerance Patient tolerated treatment well;Patient limited by pain    Behavior During Therapy [facility] for tasks assessed/performed           Past Medical History:  Diagnosis Date  . Anemia   . Cervical cancer ([place])   . Family history of adverse reaction to anesthesia     " my mother takes a long time time wake up"  . GERD (gastroesophageal reflux disease)   . Headache    migraine  . History of shingles   . Hypertension   . Migraine   . Multiple allergies   . Obesity   . Osteoarthritis    left hip  . PONV (postoperative nausea and vomiting)   . Sleep apnea    does not wear CPAP  . Wears glasses     Past Surgical History:  Procedure Laterality Date  . ABDOMINAL HYSTERECTOMY    . APPENDECTOMY    . BILIOPANCREATIC DIVERSION     with duodenal switch laparoscopic   . CHOLECYSTECTOMY    . DIAGNOSTIC LAPAROSCOPY     LOA  . ESOPHAGOGASTRODUODENOSCOPY (EGD) WITH PROPOFOL N/A 07/25/2017   Procedure: ESOPHAGOGASTRODUODENOSCOPY (EGD) WITH PROPOFOL;  Surgeon: Manya Silvas, MD;  Location: [facility] ENDOSCOPY;  Service: Endoscopy;  Laterality: N/A;  . KNEE ARTHROSCOPY    . LAPAROSCOPIC GASTRIC SLEEVE RESECTION WITH HIATAL HERNIA REPAIR    . TONSILLECTOMY    . TOTAL HIP ARTHROPLASTY Left 01/26/2016   Procedure: LEFT TOTAL HIP ARTHROPLASTY ANTERIOR APPROACH;  Surgeon:  Leandrew Koyanagi, MD;  Location: [place];  Service: Orthopedics;  Laterality: Left;  . TRANSFORAMINAL LUMBAR INTERBODY FUSION (TLIF) WITH PEDICLE SCREW FIXATION 3 LEVEL  08/2019   Basil Dess, MD L2-L5    Vitals:   09/23/20 1131  BP: 115/66  Pulse: 71  SpO2: 100%     Subjective Assessment - 09/23/20 0854    Subjective Pt presents to therapy using RW and wearing soft back brace. Pt reports she got up the night before last to use the restroom and that when she got out of bed her foot got caught on her cat's bed-stairs, she attempted to stop the fall by grabbing onto something, but she fell forward onto the floor. Pt states she doesn't remember what part of her body hit the floor first. Pt says she was able to get herself off the floor after a few minutes. Pt reports 10/10 back pain at the time of the fall. Pt says her back pain is currently 9/10 and that back brace helps.  Pt reports she has not taken anything currently for the pain. Pt describes her back pain as feeling like she's been "slugged or stabbed" and states "it's a different kind of pain now" since the fall. Pt states she has been using her  RW since her fall. Pt reports since fall she is unable to find a comfortable sleeping position and that she "moves side to side" throughout the night.  Pt states, "I should probably follow up with my doctor about this." Pt denies changes in B/B and reports no changes from her typical N/T. Pt does report that spinal flexion relieves her pain.    Pertinent History 50 year old female with history of HTN, migraines, morbid obesity--BMI-41, chronic hypotension, multiple back surgeries with chronic pain with LLE lumbar radiculopathy who was scheduled to have spinal cord stimulator placed by Dr. Davy Pique on 08/11/2020 but unable to perform surgery due to scar tissue.  History taken from patient and chart review.  Post op on awakening, she had excruciating pain RLE with plegia and and allodynia. She was admitted for work  up by Dr. Ronnald Ramp from surgical center on 08/11/2020. Thoracic MRI done revealing subtle increased T2/STIR intensity distal cord at T12-L1 level suspicious for edema/acute spinal cord injury, no CSF leak as well as prior PLIF L2-L5 with residual foraminal protrusion L3/5 potentially irritating right L3 nerve. She was started on IV Dilaudid, gabapentin as well as IV Decadron for management of pain, headaches and severe neuropathy.  She continues to have pain in RLE but is having some motor return, has constant headache, decreased hearing in right> left ear, constipation with nausea as well as epigastric pain and weakness. Therapy ongoing and CIR recommended due to functional decline.  Of note, she reports 50 lbs weigh loss in the past 3-4 months--due to issues with N/V/intake. Has had two falls last month---no recall of incident leading to fall question due to syncope. Was in process of work up for renal disease. She has had issue with LLE weakness with pain for the past year and being followed by Dr. Ernestina Patches. Did well with stimulator trial.    How long can you sit comfortably? 1 hour    How long can you stand comfortably? 8 mins    How long can you walk comfortably? 20-30 minutes    Currently in Pain? Yes    Pain Score 9     Pain Location Back    Pain Orientation Right;Left;Lower;Mid;Upper    Pain Descriptors / Indicators Stabbing    Pain Type Acute pain;Chronic pain    Pain Onset In the past 7 days   pt with chronic back pain from over a month ago but with acute pain as well due to recent fall at home   Pain Frequency Constant          Therex: all performed with patient seated Seated glute set isometrics for pain modulation and gentle strengthening - 4x10 with 2-3 second isometrics, pt reports no pain or sx with exercises  Seated hip adductor squeezes with ball - 3x15 with 2-3 second isometrics - pt reports no pain or sx with exercises   Assessment of C7-S2 and paraspinal muscles via palpation. Pt  TTP throughout paraspinal musculature with greater reports of tenderness in low back, R>L. Pt TTP throughout thoracic and lumbar spine.  Education: Pt educated on modifying therex to gentle lower body isometrics such as glute sets, seated hip adductor squeezes, and gentle LE ROM while pain is acute. Pt also instructed to follow-up with her physician regarding recent fall due to her PMH and surgical hx and current intensity of back pain. PT also provided pain science education. Pt verbalized understanding.   Manual: Performed with pt seated STM to R and L thoracic and  lumbar paraspinals x 6 minutes. Discontinued due to pt reporting no changes in sx (no increase or decrease in baseline pain).    A: PT session significantly limited today secondary to pt increased back pain (9-10/10) due to recent fall at her home. Pt reports frustration and distress throughout appointment because of fall and increased and chronic back pain and its impact on her QOL. PT focused majority of session on talking with pt about her goals, concerns, and mechanism of recent fall. PT also focused session on providing education on exercise modification for safety while her pain is acute, pain science education, and pain modulation techniques. PT also instructed pt to contact her physician regarding her recent fall and increased back pain due to her PMH and surgical hx. Pt was able to perform gentle, seated LE isometrics without changes in baseline pain or symptoms. Additional assessment during session revealed pt TTP throughout lumbar and thoracic spine and in paraspinal musculature (R>L in lumbar region). Pt will benefit from further skilled therapy to improve pain, functional mobility, balance and B LE strength in order to improve QOL and decrease fall risk.    PT Education - 09/23/20 1048    Education Details Pt instructed on pain-modulation techniques such as glute set isometrics and safety with using RW.    Person(s) Educated  Patient    Methods Explanation;Tactile cues;Verbal cues;Demonstration    Comprehension Verbalized understanding;Returned demonstration            PT Short Term Goals - 09/01/20 1349      PT SHORT TERM GOAL #1   Title Pt will be independent with HEP in order to improve strength and balance in order to decrease fall risk and improve function at home and work.    Time 6    Period Weeks    Status New    Target Date 10/13/20             PT Long Term Goals - 09/01/20 1349      PT LONG TERM GOAL #1   Title Patient (< 74 years old) will complete five times sit to stand test in < 10 seconds indicating an increased LE strength and improved balance.    Baseline 12/29: 38.45s    Time 12    Period Weeks    Status New    Target Date 11/24/20      PT LONG TERM GOAL #2   Title Pt will decrease TUG to below 14 seconds/decrease in order to demonstrate decreased fall risk.    Baseline 12/29: 32.94s    Time 12    Period Weeks    Status New    Target Date 11/24/20      PT LONG TERM GOAL #3   Title Pt will decrease DHI score by at least 18 points in order to demonstrate clinically significant reduction in disability    Baseline 12/29: 30    Time 12    Period Weeks    Status New    Target Date 11/24/20      PT LONG TERM GOAL #4   Title Patient will increase 10 meter walk test to >1.38m/s as to improve gait speed for better community ambulation and to reduce fall risk.    Baseline 12/29: 0.37 m/s    Time 12    Period Weeks    Status New    Target Date 11/24/20      PT LONG TERM GOAL #5   Title Patient will increase FOTO score  to equal to or greater than 64 to demonstrate statistically significant improvement in mobility and quality of life.    Baseline 12/29: 52    Time 12    Period Weeks    Status New    Target Date 11/24/20                 Plan - 09/23/20 1129    Clinical Impression Statement PT session significantly limited today secondary to pt increased back pain  (9-10/10) due to recent fall at her home. Pt reports frustration and distress throughout appointment because of fall and increased and chronic back pain and its impact on her QOL. PT focused majority of session on talking with pt about her goals, concerns, and mechanism of recent fall. PT also focused session on providing education on exercise modification for safety while her pain is acute, pain science education, and pain modulation techniques. PT also instructed pt to contact her physician regarding her recent fall and increased back pain due to her PMH and surgical hx. Pt was able to perform gentle, seated LE isometrics without changes in baseline pain or symptoms. Additional assessment during session revealed pt TTP throughout lumbar and thoracic spine and in paraspinal musculature (R>L in lumbar region). Pt will benefit from further skilled therapy to improve pain, functional mobility, balance and B LE strength in order to improve QOL and decrease fall risk.    Personal Factors and Comorbidities Comorbidity 3+;Time since onset of injury/illness/exacerbation    Comorbidities HYPOtension, paraparesis, SCI without spinal bone injury, DDD, cervical cancer, acute kidney injury, GERD, asthma, bradycardia, L total hip replacement, hypokalemia    Examination-Activity Limitations Squat;Lift;Stairs;Bend;Stand;Reach Overhead;Carry;Transfers    Examination-Participation Restrictions Cleaning;Laundry;Meal Prep;Community Activity;Driving;Occupation    Stability/Clinical Decision Making Evolving/Moderate complexity    Rehab Potential Fair    PT Frequency 2x / week    PT Duration 12 weeks    PT Treatment/Interventions ADLs/Self Care Home Management;Aquatic Therapy;Gait training;Stair training;Functional mobility training;Therapeutic activities;Therapeutic exercise;Balance training;Moist Heat;Manual techniques;Patient/family education;Neuromuscular re-education;Passive range of motion;Energy  conservation;Vestibular;Joint Manipulations;Spinal Manipulations    PT Next Visit Plan Instruction in pain modulation techniques, balance interventions    PT Home Exercise Plan Initiate HEP    Consulted and Agree with Plan of Care Patient           Patient will benefit from skilled therapeutic intervention in order to improve the following deficits and impairments:  Abnormal gait,Dizziness,Improper body mechanics,Decreased mobility,Cardiopulmonary status limiting activity,Decreased activity tolerance,Decreased endurance,Decreased range of motion,Decreased strength,Decreased balance,Decreased safety awareness,Difficulty walking,Impaired flexibility,Obesity  Visit Diagnosis: Muscle weakness (generalized)  Acute left-sided low back pain with left-sided sciatica  Unsteadiness on feet  Other lack of coordination  Other abnormalities of gait and mobility  Repeated falls     Problem List Patient Active Problem List   Diagnosis Date Noted  . Chronic migraine without aura without status migrainosus, not intractable   . Hypokalemia   . Adjustment reaction with anxiety and depression   . Spinal cord injury, lumbar, without spinal bone injury, sequela ([place]) 08/18/2020  . Paraparesis ([place])   . Post-operative pain   . Hypotension   . Slow transit constipation   . AKI (acute kidney injury) ([place])   . Right leg weakness 08/11/2020  . DDD (degenerative disc disease), lumbar 09/02/2019    Class: Chronic  . Degenerative disc disease, lumbar 09/02/2019  . Chest tightness 09/23/2018  . Bradycardia 09/23/2018  . Labile blood pressure 03/08/2018  . Chronic low back pain 09/03/2017  . History of total hip  replacement, left 01/26/2016  . EDEMA 04/28/2008  . LEG PAIN, BILATERAL 12/16/2007  . CERVICAL CANCER 07/02/2007  . MORBID OBESITY 07/02/2007  . DEPRESSION 07/02/2007  . COMMON MIGRAINE 07/02/2007  . ALLERGIC RHINITIS 07/02/2007  . ASTHMA 07/02/2007  . GERD 07/02/2007  . ELEVATED  BLOOD PRESSURE WITHOUT DIAGNOSIS OF HYPERTENSION 07/02/2007    Ricard Dillon PT, DPT  09/23/2020, 11:53 AM  [place] MAIN [facility] SERVICES [address] [place], [place], [address] Phone: [phone]   Fax:  [phone]  Name: Vanessa Medina MRN: [id number] Date of Birth: [birth year]

## 2020-09-24 NOTE — Telephone Encounter (Signed)
In her case, Surgeon decided when she can drive! Not me- when surgery is involved, they have to make the call. Thanks, ML

## 2020-09-24 NOTE — Patient Instructions (Signed)
Plan: Keep appointment for consideration of temporary SCS to determine if a permanent scs will be of benefit.

## 2020-09-24 NOTE — Therapy (Signed)
[place] MAIN [facility] SERVICES [address] [place], [place], [address] Phone: [phone]   Fax:  [phone]  Occupational Therapy Treatment  Patient Details  Name: Vanessa Medina MRN: [id number] Date of Birth: [birth year] No data recorded  Encounter Date: 09/23/2020   OT End of Session - 09/24/20 1829    Visit Number 4    Number of Visits 24    Date for OT Re-Evaluation 11/29/20    Authorization Type Progress report period starting 09/06/2020    OT Start Time 0845    OT Stop Time 0929    OT Time Calculation (min) 44 min    Activity Tolerance Patient tolerated treatment well    Behavior During Therapy [facility] for tasks assessed/performed           Past Medical History:  Diagnosis Date  . Anemia   . Cervical cancer ([place])   . Family history of adverse reaction to anesthesia     " my mother takes a long time time wake up"  . GERD (gastroesophageal reflux disease)   . Headache    migraine  . History of shingles   . Hypertension   . Migraine   . Multiple allergies   . Obesity   . Osteoarthritis    left hip  . PONV (postoperative nausea and vomiting)   . Sleep apnea    does not wear CPAP  . Wears glasses     Past Surgical History:  Procedure Laterality Date  . ABDOMINAL HYSTERECTOMY    . APPENDECTOMY    . BILIOPANCREATIC DIVERSION     with duodenal switch laparoscopic   . CHOLECYSTECTOMY    . DIAGNOSTIC LAPAROSCOPY     LOA  . ESOPHAGOGASTRODUODENOSCOPY (EGD) WITH PROPOFOL N/A 07/25/2017   Procedure: ESOPHAGOGASTRODUODENOSCOPY (EGD) WITH PROPOFOL;  Surgeon: Manya Silvas, MD;  Location: [facility] ENDOSCOPY;  Service: Endoscopy;  Laterality: N/A;  . KNEE ARTHROSCOPY    . LAPAROSCOPIC GASTRIC SLEEVE RESECTION WITH HIATAL HERNIA REPAIR    . TONSILLECTOMY    . TOTAL HIP ARTHROPLASTY Left 01/26/2016   Procedure: LEFT TOTAL HIP ARTHROPLASTY ANTERIOR APPROACH;  Surgeon: Leandrew Koyanagi, MD;  Location: [place];  Service: Orthopedics;   Laterality: Left;  . TRANSFORAMINAL LUMBAR INTERBODY FUSION (TLIF) WITH PEDICLE SCREW FIXATION 3 LEVEL  08/2019   Basil Dess, MD L2-L5    There were no vitals filed for this visit.   Subjective Assessment - 09/24/20 1824    Subjective  Patient reports she is doing well, has been trying to work with her hands more at home.  She brought in her knitting to show therapist to see if it is a good activity for coordination and hand strength.  Patient reports her hand fatigues at times and notices increased tremoring at times.    Pertinent History Pt. is a 50 y.o. female who had a Spinal cord stimular trial placement in November 2021. Pt. was admitted for Spinal cord stimulator placement on 08/11/2020. Pt. sustained a spinal cord Injury T12-L1, CVA. Pt. received inpatient rehab services.    Patient Stated Goals Patient would like to be independent as possible.    Currently in Pain? No/denies    Pain Score 0-No pain           ADL: Patient brought in her knitting frame and tool to see if this is a good activity for her to perform at home.  Patient demonstrating technique and use of tool, appears to grasp tool tightly.  Therapist issued and applied red built-up foam to knitting tool to increase surface and decrease hand fatigue.  Recommend patient take frequent rest breaks with stretching hand and fingers.  Patient to try activity with built-up handle over the next few days and report back to therapist next session.  Therapeutic exercise: Patient seen for wrist and hand strengthening with use of 2 pound dumbbell weight held in right hand with arm over elevated blue wedge.  Patient performing wrist flexion and extension, ulnar and radial deviation, and forearm supination pronation, 2 sets of 10 repetitions each.  Patient requires occasional therapist demonstration and verbal cues for proper form and technique.  Rest breaks as needed.  Neuromuscular reeducation: Patient seen for manipulation of small  perler beads less than half inch in size to pick up with right hand and place into small grid.  Cues for prehension patterns and for turning and placing off the grid surface.  Patient then utilizing forceps scissors to remove small beads from grid.  Hand fatigued towards the end of the session.  Response to treatment: Patient enjoys craft activities and wanting to use as a therapeutic medium at home to help with her recovery for strength and coordination.  Recommended increasing surface area of tool by utilizing red foam applied to handle.  Patient reports decreased hand fatigue when performing task in the clinic, will try at home to see how she responds when performing task for longer periods of time.  Recommend rest breaks frequently.  Continues to progress with strength and coordination and continues to benefit from skilled occupational therapy intervention to improve independence and necessary daily tasks at home and in the community.                         OT Long Term Goals - 09/06/20 1629      OT LONG TERM GOAL #1   Title Pt. will improve FOTO score by 2 points to improve Focus on functional therapeutic outcomes  for FOTO related daily tasks.    Baseline Eval: FOTO score 54    Time 12    Period Weeks    Status New    Target Date 11/29/20      OT LONG TERM GOAL #2   Title Pt. will independently, and efficiently type email correspondence inpreparation for work repated typing tasks.    Baseline Eval: Pt. has difficulty typing  accurately, and efficiently    Time 12    Period Weeks    Status New    Target Date 11/29/20      OT LONG TERM GOAL #3   Title Pt. will independently use scissors to accuraltely cut fabric for crafting projects.    Baseline Eval: pt. has difficulty using scissors for  cutting  fabric staright.    Time 12    Period Weeks    Status New    Target Date 11/29/20      OT LONG TERM GOAL #4   Title Pt. will improve activity tolerance to be  able to complete light home making tasks with modified independence.    Baseline Eva: Pt. is a ble to initiate, and perform parts of making tasks with rest breaks.    Time 12    Period Weeks    Status New    Target Date 11/29/20      OT LONG TERM GOAL #5   Title Pt. will perform light meal perparation efficiently with modifed independence.  Baseline EVal: Pt. has difficulty    Time 12    Period Weeks    Status New    Target Date 11/29/20                 Plan - 09/24/20 1840    Clinical Impression Statement Patient enjoys craft activities and wanting to use as a therapeutic medium at home to help with her recovery for strength and coordination.  Recommended increasing surface area of tool by utilizing red foam applied to handle.  Patient reports decreased hand fatigue when performing task in the clinic, will try at home to see how she responds when performing task for longer periods of time.  Recommend rest breaks frequently.  Continues to progress with strength and coordination and continues to benefit from skilled occupational therapy intervention to improve independence and necessary daily tasks at home and in the community.    Occupational performance deficits (Please refer to evaluation for details): ADL's;IADL's    Rehab Potential Good    Clinical Decision Making Several treatment options, min-mod task modification necessary    Comorbidities Affecting Occupational Performance: May have comorbidities impacting occupational performance    Modification or Assistance to Complete Evaluation  Min-Moderate modification of tasks or assist with assess necessary to complete eval    OT Frequency 2x / week    OT Duration 12 weeks    OT Treatment/Interventions Self-care/ADL training;Therapeutic exercise;Patient/family education;DME and/or AE instruction;Neuromuscular education    Consulted and Agree with Plan of Care Patient           Patient will benefit from skilled therapeutic  intervention in order to improve the following deficits and impairments:           Visit Diagnosis: Muscle weakness (generalized)  Other lack of coordination  Spinal cord injury, lumbar, without spinal bone injury, sequela [facility])    Problem List Patient Active Problem List   Diagnosis Date Noted  . Chronic migraine without aura without status migrainosus, not intractable   . Hypokalemia   . Adjustment reaction with anxiety and depression   . Spinal cord injury, lumbar, without spinal bone injury, sequela ([place]) 08/18/2020  . Paraparesis ([place])   . Post-operative pain   . Hypotension   . Slow transit constipation   . AKI (acute kidney injury) ([place])   . Right leg weakness 08/11/2020  . DDD (degenerative disc disease), lumbar 09/02/2019    Class: Chronic  . Degenerative disc disease, lumbar 09/02/2019  . Chest tightness 09/23/2018  . Bradycardia 09/23/2018  . Labile blood pressure 03/08/2018  . Chronic low back pain 09/03/2017  . History of total hip replacement, left 01/26/2016  . EDEMA 04/28/2008  . LEG PAIN, BILATERAL 12/16/2007  . CERVICAL CANCER 07/02/2007  . MORBID OBESITY 07/02/2007  . DEPRESSION 07/02/2007  . COMMON MIGRAINE 07/02/2007  . ALLERGIC RHINITIS 07/02/2007  . ASTHMA 07/02/2007  . GERD 07/02/2007  . ELEVATED BLOOD PRESSURE WITHOUT DIAGNOSIS OF HYPERTENSION 07/02/2007   Janiah Devinney T Tomasita Morrow, OTR/L, CLT  Ryenn Howeth 09/24/2020, 6:41 PM  [place] MAIN [facility] SERVICES [address] [place], [place], [address] Phone: [phone]   Fax:  [phone]  Name: Vanessa Medina MRN: [id number] Date of Birth: [birth year]

## 2020-09-27 NOTE — Telephone Encounter (Signed)
Placed a call to Vanessa Medina regarding her question about driving. No answer. Left message, also instructed to call office with questions or concerns.

## 2020-09-28 ENCOUNTER — Other Ambulatory Visit: Payer: Self-pay

## 2020-09-28 ENCOUNTER — Other Ambulatory Visit: Payer: Self-pay | Admitting: Physical Medicine and Rehabilitation

## 2020-09-28 ENCOUNTER — Ambulatory Visit: Payer: 59

## 2020-09-28 ENCOUNTER — Encounter: Payer: Self-pay | Admitting: Occupational Therapy

## 2020-09-28 ENCOUNTER — Ambulatory Visit: Payer: 59 | Admitting: Occupational Therapy

## 2020-09-28 VITALS — BP 103/60 | HR 65

## 2020-09-28 DIAGNOSIS — R278 Other lack of coordination: Secondary | ICD-10-CM

## 2020-09-28 DIAGNOSIS — M6281 Muscle weakness (generalized): Secondary | ICD-10-CM

## 2020-09-28 DIAGNOSIS — R2689 Other abnormalities of gait and mobility: Secondary | ICD-10-CM

## 2020-09-28 DIAGNOSIS — R2681 Unsteadiness on feet: Secondary | ICD-10-CM

## 2020-09-28 DIAGNOSIS — M5441 Lumbago with sciatica, right side: Secondary | ICD-10-CM

## 2020-09-28 NOTE — Therapy (Signed)
[place] MAIN [facility] SERVICES [address] [place], [place], [address] Phone: [phone]   Fax:  [phone]  Occupational Therapy Treatment  Patient Details  Name: Vanessa Medina MRN: [id number] Date of Birth: 06-25-1971 No data recorded  Encounter Date: 09/28/2020   OT End of Session - 09/28/20 0838    Visit Number 5    Number of Visits 24    Date for OT Re-Evaluation 11/29/20    Authorization Type Progress report period starting 09/06/2020    OT Start Time 0833    OT Stop Time 0915    OT Time Calculation (min) 42 min    Activity Tolerance Patient tolerated treatment well    Behavior During Therapy [facility] for tasks assessed/performed           Past Medical History:  Diagnosis Date  . Anemia   . Cervical cancer ([place])   . Family history of adverse reaction to anesthesia     " my mother takes a long time time wake up"  . GERD (gastroesophageal reflux disease)   . Headache    migraine  . History of shingles   . Hypertension   . Migraine   . Multiple allergies   . Obesity   . Osteoarthritis    left hip  . PONV (postoperative nausea and vomiting)   . Sleep apnea    does not wear CPAP  . Wears glasses     Past Surgical History:  Procedure Laterality Date  . ABDOMINAL HYSTERECTOMY    . APPENDECTOMY    . BILIOPANCREATIC DIVERSION     with duodenal switch laparoscopic   . CHOLECYSTECTOMY    . DIAGNOSTIC LAPAROSCOPY     LOA  . ESOPHAGOGASTRODUODENOSCOPY (EGD) WITH PROPOFOL N/A 07/25/2017   Procedure: ESOPHAGOGASTRODUODENOSCOPY (EGD) WITH PROPOFOL;  Surgeon: Manya Silvas, MD;  Location: [facility] ENDOSCOPY;  Service: Endoscopy;  Laterality: N/A;  . KNEE ARTHROSCOPY    . LAPAROSCOPIC GASTRIC SLEEVE RESECTION WITH HIATAL HERNIA REPAIR    . TONSILLECTOMY    . TOTAL HIP ARTHROPLASTY Left 01/26/2016   Procedure: LEFT TOTAL HIP ARTHROPLASTY ANTERIOR APPROACH;  Surgeon: Leandrew Koyanagi, MD;  Location: [place];  Service: Orthopedics;   Laterality: Left;  . TRANSFORAMINAL LUMBAR INTERBODY FUSION (TLIF) WITH PEDICLE SCREW FIXATION 3 LEVEL  08/2019   Basil Dess, MD L2-L5    There were no vitals filed for this visit.   Subjective Assessment - 09/28/20 0836    Subjective  Pt. reports that she fell last week.    Patient is accompanied by: Family member    Pertinent History Pt. is a 50 y.o. female who had a Spinal cord stimular trial placement in November 2021. Pt. was admitted for Spinal cord stimulator placement on 08/11/2020. Pt. sustained a spinal cord Injury T12-L1, CVA. Pt. received inpatient rehab services.    Currently in Pain? Yes    Pain Score 7     Pain Orientation Right;Left;Mid    Pain Descriptors / Indicators Spasm;Contraction    Pain Type Chronic pain          OT TREATMENT    Neuro muscular re-ed:  Pt. worked on right hand [facility] skills grasping 1/2" pegs with a circular tops. Pt. worked on placing the pegs onto a pegboard placed flat at a tabletop surface, and progressed to the board being placed at a vertical angle. Pt. worked on grasping the pegs, storing them in the palm of her hand, and translatory skills moving them  from her palm to the tip of her 2nd digit, and thumb in preparation for placing them onto the pegboard.   Selfcare:  Pt. worked on [employer] using Typing tests. Pt. was able to complete a 10 min. Typing test with 98% accuracy, with 12 wpm.  Pt. reports having had a fall last week at home. Pt. reports that she has been sore since the fall. Pt. reports that the built up foam handles for her knitting needles are working well for her, and that she was able to complete a hat. Pt. presented with minimal right 4th, and 5th digit use while typing, using mostly her 3rd digit.  Pt. continues to work on improving right hand [facility] skills in order to work towards improving, and maximizing independence with ADLs, and IADLs.                         OT Education - 09/28/20 8466     Education Details Ther. ex.    Person(s) Educated Patient    Comprehension Verbalized understanding               OT Long Term Goals - 09/06/20 1629      OT LONG TERM GOAL #1   Title Pt. will improve FOTO score by 2 points to improve Focus on functional therapeutic outcomes  for FOTO related daily tasks.    Baseline Eval: FOTO score 54    Time 12    Period Weeks    Status New    Target Date 11/29/20      OT LONG TERM GOAL #2   Title Pt. will independently, and efficiently type email correspondence inpreparation for work repated typing tasks.    Baseline Eval: Pt. has difficulty typing  accurately, and efficiently    Time 12    Period Weeks    Status New    Target Date 11/29/20      OT LONG TERM GOAL #3   Title Pt. will independently use scissors to accuraltely cut fabric for crafting projects.    Baseline Eval: pt. has difficulty using scissors for  cutting  fabric staright.    Time 12    Period Weeks    Status New    Target Date 11/29/20      OT LONG TERM GOAL #4   Title Pt. will improve activity tolerance to be able to complete light home making tasks with modified independence.    Baseline Eva: Pt. is a ble to initiate, and perform parts of making tasks with rest breaks.    Time 12    Period Weeks    Status New    Target Date 11/29/20      OT LONG TERM GOAL #5   Title Pt. will perform light meal perparation efficiently with modifed independence.    Baseline EVal: Pt. has difficulty    Time 12    Period Weeks    Status New    Target Date 11/29/20                 Plan - 09/28/20 0839    Clinical Impression Statement Pt. reports having had a fall last week at home. Pt. reports that she has been sore since the fall. Pt. reports that the built up foam handles for her knitting needles are working well for her, and that she was able to complete a hat. Pt. presented with minimal right 4th, and 5th digit use while typing, using  mostly her 3rd digit.  Pt.  continues to work on improving right hand [facility] skills in order to work towards improving, and maximizing independence with ADLs, and IADLs.   Occupational performance deficits (Please refer to evaluation for details): ADL's;IADL's    Rehab Potential Good    Clinical Decision Making Several treatment options, min-mod task modification necessary    Comorbidities Affecting Occupational Performance: May have comorbidities impacting occupational performance    Modification or Assistance to Complete Evaluation  Min-Moderate modification of tasks or assist with assess necessary to complete eval    OT Frequency 2x / week    OT Duration 12 weeks    OT Treatment/Interventions Self-care/ADL training;Therapeutic exercise;Patient/family education;DME and/or AE instruction;Neuromuscular education    Consulted and Agree with Plan of Care Patient           Patient will benefit from skilled therapeutic intervention in order to improve the following deficits and impairments:           Visit Diagnosis: Muscle weakness (generalized)  Other lack of coordination    Problem List Patient Active Problem List   Diagnosis Date Noted  . Chronic migraine without aura without status migrainosus, not intractable   . Hypokalemia   . Adjustment reaction with anxiety and depression   . Spinal cord injury, lumbar, without spinal bone injury, sequela ([place]) 08/18/2020  . Paraparesis ([place])   . Post-operative pain   . Hypotension   . Slow transit constipation   . AKI (acute kidney injury) ([place])   . Right leg weakness 08/11/2020  . DDD (degenerative disc disease), lumbar 09/02/2019    Class: Chronic  . Degenerative disc disease, lumbar 09/02/2019  . Chest tightness 09/23/2018  . Bradycardia 09/23/2018  . Labile blood pressure 03/08/2018  . Chronic low back pain 09/03/2017  . History of total hip replacement, left 01/26/2016  . EDEMA 04/28/2008  . LEG PAIN, BILATERAL 12/16/2007  . CERVICAL CANCER 07/02/2007   . MORBID OBESITY 07/02/2007  . DEPRESSION 07/02/2007  . COMMON MIGRAINE 07/02/2007  . ALLERGIC RHINITIS 07/02/2007  . ASTHMA 07/02/2007  . GERD 07/02/2007  . ELEVATED BLOOD PRESSURE WITHOUT DIAGNOSIS OF HYPERTENSION 07/02/2007    Harrel Carina, MS, OTR/L 09/28/2020, 10:11 AM  [place] MAIN [facility] SERVICES [address] [place], [place], [address] Phone: [phone]   Fax:  [phone]  Name: Vanessa Medina MRN: [id number] Date of Birth: 08/14/1971

## 2020-09-28 NOTE — Therapy (Signed)
[place] MAIN [facility] SERVICES [address] [place], [place], [address] Phone: [phone]   Fax:  [phone]  Physical Therapy Treatment  Patient Details  Name: Vanessa Medina MRN: [id number] Date of Birth: 1971/05/16 Referring Provider (PT): Dr. Erling Cruz   Encounter Date: 09/28/2020   PT End of Session - 09/28/20 1044    Visit Number 5    Number of Visits 17    Date for PT Re-Evaluation 10/27/20    Authorization Type eval: 12/29    PT Start Time 0932    PT Stop Time 1021    PT Time Calculation (min) 49 min    Equipment Utilized During Treatment Back brace;Gait belt    Activity Tolerance Patient tolerated treatment well;Patient limited by fatigue    Behavior During Therapy [facility] for tasks assessed/performed           Past Medical History:  Diagnosis Date  . Anemia   . Cervical cancer ([place])   . Family history of adverse reaction to anesthesia     " my mother takes a long time time wake up"  . GERD (gastroesophageal reflux disease)   . Headache    migraine  . History of shingles   . Hypertension   . Migraine   . Multiple allergies   . Obesity   . Osteoarthritis    left hip  . PONV (postoperative nausea and vomiting)   . Sleep apnea    does not wear CPAP  . Wears glasses     Past Surgical History:  Procedure Laterality Date  . ABDOMINAL HYSTERECTOMY    . APPENDECTOMY    . BILIOPANCREATIC DIVERSION     with duodenal switch laparoscopic   . CHOLECYSTECTOMY    . DIAGNOSTIC LAPAROSCOPY     LOA  . ESOPHAGOGASTRODUODENOSCOPY (EGD) WITH PROPOFOL N/A 07/25/2017   Procedure: ESOPHAGOGASTRODUODENOSCOPY (EGD) WITH PROPOFOL;  Surgeon: Manya Silvas, MD;  Location: [facility] ENDOSCOPY;  Service: Endoscopy;  Laterality: N/A;  . KNEE ARTHROSCOPY    . LAPAROSCOPIC GASTRIC SLEEVE RESECTION WITH HIATAL HERNIA REPAIR    . TONSILLECTOMY    . TOTAL HIP ARTHROPLASTY Left 01/26/2016   Procedure: LEFT TOTAL HIP ARTHROPLASTY ANTERIOR APPROACH;   Surgeon: Leandrew Koyanagi, MD;  Location: [place];  Service: Orthopedics;  Laterality: Left;  . TRANSFORAMINAL LUMBAR INTERBODY FUSION (TLIF) WITH PEDICLE SCREW FIXATION 3 LEVEL  08/2019   Basil Dess, MD L2-L5    Vitals:   09/28/20 1051  BP: 103/60  Pulse: 65     Subjective Assessment - 09/28/20 0932    Subjective Pt reports "I'm still really sore" from the fall. Pt reports she called her doctor's office and is waiting to hear back. Pt reports back pain is currently 7/10. Pt reports new numbness and burning in R LE and states "which is new."    Pertinent History 50 year old female with history of HTN, migraines, morbid obesity--BMI-41, chronic hypotension, multiple back surgeries with chronic pain with LLE lumbar radiculopathy who was scheduled to have spinal cord stimulator placed by Dr. Davy Pique on 08/11/2020 but unable to perform surgery due to scar tissue.  History taken from patient and chart review.  Post op on awakening, she had excruciating pain RLE with plegia and and allodynia. She was admitted for work up by Dr. Ronnald Ramp from surgical center on 08/11/2020. Thoracic MRI done revealing subtle increased T2/STIR intensity distal cord at T12-L1 level suspicious for edema/acute spinal cord injury, no CSF leak as well as  prior PLIF L2-L5 with residual foraminal protrusion L3/5 potentially irritating right L3 nerve. She was started on IV Dilaudid, gabapentin as well as IV Decadron for management of pain, headaches and severe neuropathy.  She continues to have pain in RLE but is having some motor return, has constant headache, decreased hearing in right> left ear, constipation with nausea as well as epigastric pain and weakness. Therapy ongoing and CIR recommended due to functional decline.  Of note, she reports 50 lbs weigh loss in the past 3-4 months--due to issues with N/V/intake. Has had two falls last month---no recall of incident leading to fall question due to syncope. Was in process of work up for renal  disease. She has had issue with LLE weakness with pain for the past year and being followed by Dr. Ernestina Patches. Did well with stimulator trial.    How long can you sit comfortably? 1 hour    How long can you stand comfortably? 8 mins    How long can you walk comfortably? 20-30 minutes    Currently in Pain? Yes    Pain Score 7     Pain Location Back    Pain Orientation Right;Left;Mid    Pain Radiating Towards B LEs    Pain Onset In the past 7 days   pt with chronic back pain from over a month ago but with acute pain as well due to recent fall at home         Treatment:  Therapeutic Exercise: Performed seated Glute sets 2x10 with 3 second isometric LAQ with RTB on LLE and against gravity RLE 1x10 B, 2x10 L annd 2x7 R; Seated adduction - 2x10 - pt reports feels like she's warmed up  Toe taps with R LE onto airex - 2x10 reps. Pt reports no pain with exercise but does report feelings of tingling in R foot and that she can "feel my muscles working." Modified to seated toe taps with R LE onto airex - 1x10 reps, pt reports no tingling in R LE.   Neuro-Re-ed:  WBOS standing at bar - 1x4 min, CGA and intermittent UE support. NBOS at bar - x3 min with intermittent UE support, CGA from PT. Standing on airex WBOS x 2 min, intermittent UE support and CGA Standing on airex NBOS x2 min, intermittent UE support, CGA    Assessment: Pt with no increase in baseline back pain throughout session with interventions, reporting 7/10 at beginning of session prior to interventions and 7/10 LBP at end of session. During balance exercises, pt demonstrated tendency to shift weight onto heels and lose balance in posterior direction. There was one instance where pt required min a from PT and UE support on bar to regain balance, and 3 other instances where pt exhibited ankle strategy and UE support to correct postural sway and prevent LOB. Pt will benefit from further skilled PT to decrease pain, and increase B LE  strength and balance in order to improve safety with all functional activities and QOL.      PT Education - 09/28/20 1044    Education Details Pt educated on technique with step ups, body mechanics    Person(s) Educated Patient    Methods Explanation;Demonstration;Verbal cues    Comprehension Verbalized understanding;Returned demonstration            PT Short Term Goals - 09/01/20 1349      PT SHORT TERM GOAL #1   Title Pt will be independent with HEP in order to improve strength and  balance in order to decrease fall risk and improve function at home and work.    Time 6    Period Weeks    Status New    Target Date 10/13/20             PT Long Term Goals - 09/01/20 1349      PT LONG TERM GOAL #1   Title Patient (< 42 years old) will complete five times sit to stand test in < 10 seconds indicating an increased LE strength and improved balance.    Baseline 12/29: 38.45s    Time 12    Period Weeks    Status New    Target Date 11/24/20      PT LONG TERM GOAL #2   Title Pt will decrease TUG to below 14 seconds/decrease in order to demonstrate decreased fall risk.    Baseline 12/29: 32.94s    Time 12    Period Weeks    Status New    Target Date 11/24/20      PT LONG TERM GOAL #3   Title Pt will decrease DHI score by at least 18 points in order to demonstrate clinically significant reduction in disability    Baseline 12/29: 30    Time 12    Period Weeks    Status New    Target Date 11/24/20      PT LONG TERM GOAL #4   Title Patient will increase 10 meter walk test to >1.1m/s as to improve gait speed for better community ambulation and to reduce fall risk.    Baseline 12/29: 0.37 m/s    Time 12    Period Weeks    Status New    Target Date 11/24/20      PT LONG TERM GOAL #5   Title Patient will increase FOTO score to equal to or greater than 64 to demonstrate statistically significant improvement in mobility and quality of life.    Baseline 12/29: 52    Time 12     Period Weeks    Status New    Target Date 11/24/20                 Plan - 09/28/20 1045    Clinical Impression Statement Pt with no increase in baseline back pain throughout session with interventions, reporting 7/10 at beginning of session prior to interventions and 7/10 LBP at end of session. During balance exercises, pt demonstrated tendency to shift weight onto heels and lose balance in posterior direction. There was one instance where pt required min a from PT and UE support on bar to regain balance, and 3 other instances where pt exhibited ankle strategy and UE support to correct postural sway and prevent LOB. PT encouraged pt to reach out to her Dr regarding new sx in her LE. Pt will benefit from further skilled PT to decrease pain, and increase B LE strength and balance in order to improve safety with all functional activities and QOL.    Personal Factors and Comorbidities Comorbidity 3+;Time since onset of injury/illness/exacerbation    Comorbidities HYPOtension, paraparesis, SCI without spinal bone injury, DDD, cervical cancer, acute kidney injury, GERD, asthma, bradycardia, L total hip replacement, hypokalemia    Examination-Activity Limitations Squat;Lift;Stairs;Bend;Stand;Reach Overhead;Carry;Transfers    Examination-Participation Restrictions Cleaning;Laundry;Meal Prep;Community Activity;Driving;Occupation    Stability/Clinical Decision Making Evolving/Moderate complexity    Rehab Potential Fair    PT Frequency 2x / week    PT Duration 12 weeks    PT Treatment/Interventions  ADLs/Self Care Home Management;Aquatic Therapy;Gait training;Stair training;Functional mobility training;Therapeutic activities;Therapeutic exercise;Balance training;Moist Heat;Manual techniques;Patient/family education;Neuromuscular re-education;Passive range of motion;Energy conservation;Vestibular;Joint Manipulations;Spinal Manipulations    PT Next Visit Plan Instruction in pain modulation techniques,  progress balance interventions    PT Home Exercise Plan Initiate HEP    Consulted and Agree with Plan of Care Patient           Patient will benefit from skilled therapeutic intervention in order to improve the following deficits and impairments:  Abnormal gait,Dizziness,Improper body mechanics,Decreased mobility,Cardiopulmonary status limiting activity,Decreased activity tolerance,Decreased endurance,Decreased range of motion,Decreased strength,Decreased balance,Decreased safety awareness,Difficulty walking,Impaired flexibility,Obesity  Visit Diagnosis: Muscle weakness (generalized)  Other lack of coordination  Unsteadiness on feet  Other abnormalities of gait and mobility  Acute bilateral low back pain with right-sided sciatica     Problem List Patient Active Problem List   Diagnosis Date Noted  . Chronic migraine without aura without status migrainosus, not intractable   . Hypokalemia   . Adjustment reaction with anxiety and depression   . Spinal cord injury, lumbar, without spinal bone injury, sequela ([place]) 08/18/2020  . Paraparesis ([place])   . Post-operative pain   . Hypotension   . Slow transit constipation   . AKI (acute kidney injury) ([place])   . Right leg weakness 08/11/2020  . DDD (degenerative disc disease), lumbar 09/02/2019    Class: Chronic  . Degenerative disc disease, lumbar 09/02/2019  . Chest tightness 09/23/2018  . Bradycardia 09/23/2018  . Labile blood pressure 03/08/2018  . Chronic low back pain 09/03/2017  . History of total hip replacement, left 01/26/2016  . EDEMA 04/28/2008  . LEG PAIN, BILATERAL 12/16/2007  . CERVICAL CANCER 07/02/2007  . MORBID OBESITY 07/02/2007  . DEPRESSION 07/02/2007  . COMMON MIGRAINE 07/02/2007  . ALLERGIC RHINITIS 07/02/2007  . ASTHMA 07/02/2007  . GERD 07/02/2007  . ELEVATED BLOOD PRESSURE WITHOUT DIAGNOSIS OF HYPERTENSION 07/02/2007   Ricard Dillon PT, DPT  Zollie Pee 09/28/2020, 6:03 PM  [place] MAIN [facility] SERVICES [address] [place], [place], [address] Phone: [phone]   Fax:  [phone]  Name: Vanessa Medina MRN: [id number] Date of Birth: Feb 13, 1971

## 2020-09-30 ENCOUNTER — Ambulatory Visit: Payer: 59 | Admitting: Occupational Therapy

## 2020-09-30 ENCOUNTER — Ambulatory Visit: Payer: 59

## 2020-09-30 ENCOUNTER — Encounter: Payer: Self-pay | Admitting: Occupational Therapy

## 2020-09-30 ENCOUNTER — Telehealth: Payer: Self-pay

## 2020-09-30 ENCOUNTER — Other Ambulatory Visit: Payer: Self-pay

## 2020-09-30 VITALS — BP 105/63 | HR 75

## 2020-09-30 DIAGNOSIS — R278 Other lack of coordination: Secondary | ICD-10-CM

## 2020-09-30 DIAGNOSIS — M6281 Muscle weakness (generalized): Secondary | ICD-10-CM

## 2020-09-30 DIAGNOSIS — R2681 Unsteadiness on feet: Secondary | ICD-10-CM

## 2020-09-30 DIAGNOSIS — M5442 Lumbago with sciatica, left side: Secondary | ICD-10-CM

## 2020-09-30 DIAGNOSIS — R2689 Other abnormalities of gait and mobility: Secondary | ICD-10-CM

## 2020-09-30 NOTE — Telephone Encounter (Signed)
Ok thanks 

## 2020-09-30 NOTE — Therapy (Signed)
[place] MAIN [facility] SERVICES [address] [place], [place], [address] Phone: [phone]   Fax:  [phone]  Physical Therapy Treatment  Patient Details  Name: Vanessa Medina MRN: [id number] Date of Birth: 05-Apr-1971 Referring Provider (PT): Dr. Erling Cruz   Encounter Date: 09/30/2020   PT End of Session - 09/30/20 1621    Visit Number 6    Number of Visits 17    Date for PT Re-Evaluation 10/27/20    Authorization Type eval: 12/29    PT Start Time 0931    PT Stop Time 1015    PT Time Calculation (min) 44 min    Equipment Utilized During Treatment Gait belt    Activity Tolerance Patient tolerated treatment well;Patient limited by fatigue    Behavior During Therapy [facility] for tasks assessed/performed           Past Medical History:  Diagnosis Date  . Anemia   . Cervical cancer ([place])   . Family history of adverse reaction to anesthesia     " my mother takes a long time time wake up"  . GERD (gastroesophageal reflux disease)   . Headache    migraine  . History of shingles   . Hypertension   . Migraine   . Multiple allergies   . Obesity   . Osteoarthritis    left hip  . PONV (postoperative nausea and vomiting)   . Sleep apnea    does not wear CPAP  . Wears glasses     Past Surgical History:  Procedure Laterality Date  . ABDOMINAL HYSTERECTOMY    . APPENDECTOMY    . BILIOPANCREATIC DIVERSION     with duodenal switch laparoscopic   . CHOLECYSTECTOMY    . DIAGNOSTIC LAPAROSCOPY     LOA  . ESOPHAGOGASTRODUODENOSCOPY (EGD) WITH PROPOFOL N/A 07/25/2017   Procedure: ESOPHAGOGASTRODUODENOSCOPY (EGD) WITH PROPOFOL;  Surgeon: Manya Silvas, MD;  Location: [facility] ENDOSCOPY;  Service: Endoscopy;  Laterality: N/A;  . KNEE ARTHROSCOPY    . LAPAROSCOPIC GASTRIC SLEEVE RESECTION WITH HIATAL HERNIA REPAIR    . TONSILLECTOMY    . TOTAL HIP ARTHROPLASTY Left 01/26/2016   Procedure: LEFT TOTAL HIP ARTHROPLASTY ANTERIOR APPROACH;  Surgeon:  Leandrew Koyanagi, MD;  Location: [place];  Service: Orthopedics;  Laterality: Left;  . TRANSFORAMINAL LUMBAR INTERBODY FUSION (TLIF) WITH PEDICLE SCREW FIXATION 3 LEVEL  08/2019   Basil Dess, MD L2-L5    Vitals:   09/30/20 1616  BP: 105/63  Pulse: 75     Subjective Assessment - 09/30/20 1618    Subjective Pt reports R LE numbness. Pt reports she is calling a former physician she used to se today set up an appointment. Pt states "he is familiar with my history." Pt states she had "bad blood pressure day" yesterday reporting systolic blood pressure was in the 80s and that she had a fall back into her recliner. Pt reports back still store. Does not provide pain rating.    Pertinent History 50 year old female with history of HTN, migraines, morbid obesity--BMI-41, chronic hypotension, multiple back surgeries with chronic pain with LLE lumbar radiculopathy who was scheduled to have spinal cord stimulator placed by Dr. Davy Pique on 08/11/2020 but unable to perform surgery due to scar tissue.  History taken from patient and chart review.  Post op on awakening, she had excruciating pain RLE with plegia and and allodynia. She was admitted for work up by Dr. Ronnald Ramp from surgical center on 08/11/2020. Thoracic MRI  done revealing subtle increased T2/STIR intensity distal cord at T12-L1 level suspicious for edema/acute spinal cord injury, no CSF leak as well as prior PLIF L2-L5 with residual foraminal protrusion L3/5 potentially irritating right L3 nerve. She was started on IV Dilaudid, gabapentin as well as IV Decadron for management of pain, headaches and severe neuropathy.  She continues to have pain in RLE but is having some motor return, has constant headache, decreased hearing in right> left ear, constipation with nausea as well as epigastric pain and weakness. Therapy ongoing and CIR recommended due to functional decline.  Of note, she reports 50 lbs weigh loss in the past 3-4 months--due to issues with N/V/intake. Has  had two falls last month---no recall of incident leading to fall question due to syncope. Was in process of work up for renal disease. She has had issue with LLE weakness with pain for the past year and being followed by Dr. Ernestina Patches. Did well with stimulator trial.    How long can you sit comfortably? 1 hour    How long can you stand comfortably? 8 mins    How long can you walk comfortably? 20-30 minutes    Currently in Pain? Yes    Pain Location Back    Pain Onset In the past 7 days   pt with chronic back pain from over a month ago but with acute pain as well due to recent fall at home          BP prior to interventions: pt seated 105/63 HR 75   Therapeutic Exercise:  Seated marches - 3x15   Seated adductor squeezes with red ball 2x20  Seated core stabilization green ball lifts with TA contraction 1x10, 1x12  Seated RTB hip abduction - 3x15   LAQ seated - 3x12 B LES  Seated core stabilization green ball lifts with TA contraction 2x10   Standing tolerance at bar with B UE support, CGA x60 sec  Neuro Re-ed  Standing at bar WBOS x 2 min , CGA   Standing at bar WBOS EC 2x30 Sec, CGA  Followed by prolonged seated rest break due to pt reports of pre-syncope feelings/sx  Pt BP taken with pt seated: 107/68 HR 70 SPO2 99% followed by seated core stabilization with green ball lifts (see therex)    Assessment: Pt required prolonged seated rest break this session due to reports of pre-syncope feelings following standing neuro-re ed exercises. Pt was seated and BP was taken (107/68, HR 70 and SPO2 99%). Pt BP at beginning of session was 105/63. After rest pt performed seated core stabilization green ball lifts and reported feeling better and sx resolving. Pt was able to follow with 1 minute of standing tolerance at bar with CGA before sitting back in [facility] for end of session without sx. Pt will benefit from further skilled therapy to address deficits in balance and strength in order to  decrease fall risk.      PT Education - 09/30/20 1620    Education Details Pt educated on seated core stabilization with overhead lifts exercise    Person(s) Educated Patient    Methods Explanation;Demonstration;Verbal cues    Comprehension Verbalized understanding;Returned demonstration            PT Short Term Goals - 09/01/20 1349      PT SHORT TERM GOAL #1   Title Pt will be independent with HEP in order to improve strength and balance in order to decrease fall risk and improve function at home  and work.    Time 6    Period Weeks    Status New    Target Date 10/13/20             PT Long Term Goals - 09/01/20 1349      PT LONG TERM GOAL #1   Title Patient (< 60 years old) will complete five times sit to stand test in < 10 seconds indicating an increased LE strength and improved balance.    Baseline 12/29: 38.45s    Time 12    Period Weeks    Status New    Target Date 11/24/20      PT LONG TERM GOAL #2   Title Pt will decrease TUG to below 14 seconds/decrease in order to demonstrate decreased fall risk.    Baseline 12/29: 32.94s    Time 12    Period Weeks    Status New    Target Date 11/24/20      PT LONG TERM GOAL #3   Title Pt will decrease DHI score by at least 18 points in order to demonstrate clinically significant reduction in disability    Baseline 12/29: 30    Time 12    Period Weeks    Status New    Target Date 11/24/20      PT LONG TERM GOAL #4   Title Patient will increase 10 meter walk test to >1.75m/s as to improve gait speed for better community ambulation and to reduce fall risk.    Baseline 12/29: 0.37 m/s    Time 12    Period Weeks    Status New    Target Date 11/24/20      PT LONG TERM GOAL #5   Title Patient will increase FOTO score to equal to or greater than 64 to demonstrate statistically significant improvement in mobility and quality of life.    Baseline 12/29: 52    Time 12    Period Weeks    Status New    Target Date  11/24/20                 Plan - 09/30/20 1637    Clinical Impression Statement Pt required prolonged seated rest break this session due to reports of pre-syncope feelings following standing neuro-re ed exercises. Pt was seated and BP was taken (107/68, HR 70 and SPO2 99%). Pt BP at beginning of session was 105/63. After rest pt performed seated core stabilization green ball lifts and reported feeling better and sx resolving. Pt was able to follow with 1 minute of standing tolerance at bar with CGA before sitting back in [facility] for end of session without sx. Pt will benefit from further skilled therapy to address deficits in balance and strength in order to decrease fall risk.    Personal Factors and Comorbidities Comorbidity 3+;Time since onset of injury/illness/exacerbation    Comorbidities HYPOtension, paraparesis, SCI without spinal bone injury, DDD, cervical cancer, acute kidney injury, GERD, asthma, bradycardia, L total hip replacement, hypokalemia    Examination-Activity Limitations Squat;Lift;Stairs;Bend;Stand;Reach Overhead;Carry;Transfers    Examination-Participation Restrictions Cleaning;Laundry;Meal Prep;Community Activity;Driving;Occupation    Stability/Clinical Decision Making Evolving/Moderate complexity    Rehab Potential Fair    PT Frequency 2x / week    PT Duration 12 weeks    PT Treatment/Interventions ADLs/Self Care Home Management;Aquatic Therapy;Gait training;Stair training;Functional mobility training;Therapeutic activities;Therapeutic exercise;Balance training;Moist Heat;Manual techniques;Patient/family education;Neuromuscular re-education;Passive range of motion;Energy conservation;Vestibular;Joint Manipulations;Spinal Manipulations    PT Next Visit Plan Instruction in pain modulation techniques, progress  balance interventions    PT Home Exercise Plan Initiate HEP; progress balance exercises    Consulted and Agree with Plan of Care Patient           Patient will  benefit from skilled therapeutic intervention in order to improve the following deficits and impairments:  Abnormal gait,Dizziness,Improper body mechanics,Decreased mobility,Cardiopulmonary status limiting activity,Decreased activity tolerance,Decreased endurance,Decreased range of motion,Decreased strength,Decreased balance,Decreased safety awareness,Difficulty walking,Impaired flexibility,Obesity  Visit Diagnosis: Muscle weakness (generalized)  Unsteadiness on feet  Other lack of coordination  Other abnormalities of gait and mobility  Acute left-sided low back pain with left-sided sciatica     Problem List Patient Active Problem List   Diagnosis Date Noted  . Chronic migraine without aura without status migrainosus, not intractable   . Hypokalemia   . Adjustment reaction with anxiety and depression   . Spinal cord injury, lumbar, without spinal bone injury, sequela ([place]) 08/18/2020  . Paraparesis ([place])   . Post-operative pain   . Hypotension   . Slow transit constipation   . AKI (acute kidney injury) ([place])   . Right leg weakness 08/11/2020  . DDD (degenerative disc disease), lumbar 09/02/2019    Class: Chronic  . Degenerative disc disease, lumbar 09/02/2019  . Chest tightness 09/23/2018  . Bradycardia 09/23/2018  . Labile blood pressure 03/08/2018  . Chronic low back pain 09/03/2017  . History of total hip replacement, left 01/26/2016  . EDEMA 04/28/2008  . LEG PAIN, BILATERAL 12/16/2007  . CERVICAL CANCER 07/02/2007  . MORBID OBESITY 07/02/2007  . DEPRESSION 07/02/2007  . COMMON MIGRAINE 07/02/2007  . ALLERGIC RHINITIS 07/02/2007  . ASTHMA 07/02/2007  . GERD 07/02/2007  . ELEVATED BLOOD PRESSURE WITHOUT DIAGNOSIS OF HYPERTENSION 07/02/2007    Zollie Pee 09/30/2020, 4:41 PM  [place] MAIN [facility] SERVICES [address] [place], [place], [address] Phone: [phone]   Fax:  [phone]  Name: Vanessa Medina MRN:  [id number] Date of Birth: 1971/01/13

## 2020-09-30 NOTE — Telephone Encounter (Signed)
Pt called for an repeat. Bilateral L5-S1 Facets on 03/04/20. Ok to repeat if helped, same problem/side, and no new injury?

## 2020-09-30 NOTE — Telephone Encounter (Signed)
Patient requested OV. Scheduled for 2/9.

## 2020-09-30 NOTE — Telephone Encounter (Signed)
Patient called she is requesting a appointment with Dr.Newton [phone]

## 2020-09-30 NOTE — Therapy (Addendum)
[place] MAIN [facility] SERVICES [address] [place], [place], [address] Phone: [phone]   Fax:  [phone]  Occupational Therapy Treatment  Patient Details  Name: Vanessa Medina MRN: [id number] Date of Birth: May 05, 1971 No data recorded  Encounter Date: 09/30/2020   OT End of Session - 09/30/20 0913    Visit Number 6    Number of Visits 24    Date for OT Re-Evaluation 11/29/20    Authorization Type Progress report period starting 09/06/2020    OT Start Time 0845    OT Stop Time 0930    OT Time Calculation (min) 45 min    Activity Tolerance Patient tolerated treatment well    Behavior During Therapy [facility] for tasks assessed/performed           Past Medical History:  Diagnosis Date  . Anemia   . Cervical cancer ([place])   . Family history of adverse reaction to anesthesia     " my mother takes a long time time wake up"  . GERD (gastroesophageal reflux disease)   . Headache    migraine  . History of shingles   . Hypertension   . Migraine   . Multiple allergies   . Obesity   . Osteoarthritis    left hip  . PONV (postoperative nausea and vomiting)   . Sleep apnea    does not wear CPAP  . Wears glasses     Past Surgical History:  Procedure Laterality Date  . ABDOMINAL HYSTERECTOMY    . APPENDECTOMY    . BILIOPANCREATIC DIVERSION     with duodenal switch laparoscopic   . CHOLECYSTECTOMY    . DIAGNOSTIC LAPAROSCOPY     LOA  . ESOPHAGOGASTRODUODENOSCOPY (EGD) WITH PROPOFOL N/A 07/25/2017   Procedure: ESOPHAGOGASTRODUODENOSCOPY (EGD) WITH PROPOFOL;  Surgeon: Manya Silvas, MD;  Location: [facility] ENDOSCOPY;  Service: Endoscopy;  Laterality: N/A;  . KNEE ARTHROSCOPY    . LAPAROSCOPIC GASTRIC SLEEVE RESECTION WITH HIATAL HERNIA REPAIR    . TONSILLECTOMY    . TOTAL HIP ARTHROPLASTY Left 01/26/2016   Procedure: LEFT TOTAL HIP ARTHROPLASTY ANTERIOR APPROACH;  Surgeon: Leandrew Koyanagi, MD;  Location: [place];  Service: Orthopedics;   Laterality: Left;  . TRANSFORAMINAL LUMBAR INTERBODY FUSION (TLIF) WITH PEDICLE SCREW FIXATION 3 LEVEL  08/2019   Basil Dess, MD L2-L5    There were no vitals filed for this visit.   Subjective Assessment - 09/30/20 0857    Subjective  Pt. reports that she fell last week.    Patient is accompanied by: Family member    Pertinent History Pt. is a 50 y.o. female who had a Spinal cord stimular trial placement in November 2021. Pt. was admitted for Spinal cord stimulator placement on 08/11/2020. Pt. sustained a spinal cord Injury T12-L1, CVA. Pt. received inpatient rehab services.          OT TREATMENT    Neuro muscular re-education:   Pt. worked on right hand [facility] skills grasping gasping small earring backings, and placing them onto a small resistive dowel.   Therapeutic Exercise:  Pt. performed bilateral gross gripping with grip strengthener. Pt. worked on sustaining grip while grasping pegs and reaching at various heights. The gripper was set at 11.2# for the right hand, and, 17.9# with the left hand. Pt. worked on pinch strengthening in the bilateral hands for lateral, and 3pt. pinch using yellow, red, green, and blue resistive clips. Pt. worked on placing the clips at various vertical  and horizontal angles. Tactile and verbal cues were required for eliciting the desired movement.  Pt. reports that her right hand has been really shakey when trying to type an email.  Pt. presents with limited bilateral UE strength, and [facility] skills.  Pt. presented with increased shakiness after strengthening with the right hand as she fatigues. Pt. continues to work on improving UE strength, and [facility] skills in order to work towards improving LUE functioning, and maximizing independence with ADLs, and IADL tasks.                      OT Education - 09/30/20 2353    Education Details Ther. ex.    Person(s) Educated Patient    Methods Explanation    Comprehension Verbalized understanding                OT Long Term Goals - 09/06/20 1629      OT LONG TERM GOAL #1   Title Pt. will improve FOTO score by 2 points to improve Focus on functional therapeutic outcomes  for FOTO related daily tasks.    Baseline Eval: FOTO score 54    Time 12    Period Weeks    Status New    Target Date 11/29/20      OT LONG TERM GOAL #2   Title Pt. will independently, and efficiently type email correspondence inpreparation for work repated typing tasks.    Baseline Eval: Pt. has difficulty typing  accurately, and efficiently    Time 12    Period Weeks    Status New    Target Date 11/29/20      OT LONG TERM GOAL #3   Title Pt. will independently use scissors to accuraltely cut fabric for crafting projects.    Baseline Eval: pt. has difficulty using scissors for  cutting  fabric staright.    Time 12    Period Weeks    Status New    Target Date 11/29/20      OT LONG TERM GOAL #4   Title Pt. will improve activity tolerance to be able to complete light home making tasks with modified independence.    Baseline Eva: Pt. is a ble to initiate, and perform parts of making tasks with rest breaks.    Time 12    Period Weeks    Status New    Target Date 11/29/20      OT LONG TERM GOAL #5   Title Pt. will perform light meal perparation efficiently with modifed independence.    Baseline EVal: Pt. has difficulty    Time 12    Period Weeks    Status New    Target Date 11/29/20                 Plan - 09/30/20 0914    Clinical Impression Statement Pt. reports that her right hand has been really shakey when trying to type an email.  Pt. presents with limited bilateral UE strength, and [facility] skills.  Pt. presented with increased shakiness after strengthening with the right hand as she fatigues. Pt. continues to work on improving UE strength, and [facility] skills in order to work towards improving LUE functioning, and maximizing independence with ADLs, and IADL tasks.    Occupational performance  deficits (Please refer to evaluation for details): ADL's;IADL's    Rehab Potential Good    Clinical Decision Making Several treatment options, min-mod task modification necessary    Comorbidities Affecting Occupational Performance:  May have comorbidities impacting occupational performance    Modification or Assistance to Complete Evaluation  Min-Moderate modification of tasks or assist with assess necessary to complete eval    OT Frequency 2x / week    OT Duration 12 weeks    OT Treatment/Interventions Self-care/ADL training;Therapeutic exercise;Patient/family education;DME and/or AE instruction;Neuromuscular education    Consulted and Agree with Plan of Care Patient           Patient will benefit from skilled therapeutic intervention in order to improve the following deficits and impairments:           Visit Diagnosis: Muscle weakness (generalized)  Other lack of coordination    Problem List Patient Active Problem List   Diagnosis Date Noted  . Chronic migraine without aura without status migrainosus, not intractable   . Hypokalemia   . Adjustment reaction with anxiety and depression   . Spinal cord injury, lumbar, without spinal bone injury, sequela ([place]) 08/18/2020  . Paraparesis ([place])   . Post-operative pain   . Hypotension   . Slow transit constipation   . AKI (acute kidney injury) ([place])   . Right leg weakness 08/11/2020  . DDD (degenerative disc disease), lumbar 09/02/2019    Class: Chronic  . Degenerative disc disease, lumbar 09/02/2019  . Chest tightness 09/23/2018  . Bradycardia 09/23/2018  . Labile blood pressure 03/08/2018  . Chronic low back pain 09/03/2017  . History of total hip replacement, left 01/26/2016  . EDEMA 04/28/2008  . LEG PAIN, BILATERAL 12/16/2007  . CERVICAL CANCER 07/02/2007  . MORBID OBESITY 07/02/2007  . DEPRESSION 07/02/2007  . COMMON MIGRAINE 07/02/2007  . ALLERGIC RHINITIS 07/02/2007  . ASTHMA 07/02/2007  . GERD 07/02/2007  .  ELEVATED BLOOD PRESSURE WITHOUT DIAGNOSIS OF HYPERTENSION 07/02/2007    Harrel Carina, MS, OTR/L 09/30/2020, 9:45 AM  [place] MAIN [facility] SERVICES [address] [place], [place], [address] Phone: [phone]   Fax:  [phone]  Name: Vanessa Medina MRN: [id number] Date of Birth: Dec 29, 1970

## 2020-10-01 ENCOUNTER — Telehealth: Payer: Self-pay | Admitting: Physical Medicine and Rehabilitation

## 2020-10-01 NOTE — Telephone Encounter (Signed)
sched for 10/07/20 @ 2pm

## 2020-10-01 NOTE — Telephone Encounter (Signed)
Just an FYI- she wanting a appt with Dr. Ernestina Patches. Pt inform Ernestina Patches that the pt had a fall recently and she might need appt when [place]. Thanks

## 2020-10-01 NOTE — Telephone Encounter (Signed)
FYI

## 2020-10-01 NOTE — Telephone Encounter (Signed)
It appears from a message in December that long-term disability was sent by Caffie Damme to [facility] 08/19/2020.  This was a message through Dr. Otho Ket messaging.  Patient is a patient of Dr. Louanne Skye having had lumbar spine surgery in December 2020.

## 2020-10-01 NOTE — Telephone Encounter (Signed)
Aishu from [employer] called and was wondering if we got the disability form that was faxed over. Please give her a call [phone]

## 2020-10-03 ENCOUNTER — Encounter: Payer: Self-pay | Admitting: Orthopaedic Surgery

## 2020-10-04 ENCOUNTER — Telehealth: Payer: Self-pay | Admitting: Physical Medicine and Rehabilitation

## 2020-10-04 ENCOUNTER — Encounter: Payer: Self-pay | Admitting: Physical Medicine and Rehabilitation

## 2020-10-04 NOTE — Telephone Encounter (Signed)
Noted  

## 2020-10-04 NOTE — Telephone Encounter (Signed)
Please advise 

## 2020-10-04 NOTE — Telephone Encounter (Signed)
Please get approval for gel injections.  Thanks.

## 2020-10-04 NOTE — Telephone Encounter (Signed)
Called Richard with [employer] gave him the number to contact [facility] concerning disability form that was faxed.

## 2020-10-05 ENCOUNTER — Ambulatory Visit: Payer: 59 | Attending: Physical Medicine and Rehabilitation

## 2020-10-05 ENCOUNTER — Encounter: Payer: Self-pay | Admitting: Occupational Therapy

## 2020-10-05 ENCOUNTER — Ambulatory Visit: Payer: 59 | Admitting: Occupational Therapy

## 2020-10-05 DIAGNOSIS — R2689 Other abnormalities of gait and mobility: Secondary | ICD-10-CM | POA: Insufficient documentation

## 2020-10-05 DIAGNOSIS — G903 Multi-system degeneration of the autonomic nervous system: Secondary | ICD-10-CM | POA: Insufficient documentation

## 2020-10-05 DIAGNOSIS — M6281 Muscle weakness (generalized): Secondary | ICD-10-CM | POA: Insufficient documentation

## 2020-10-05 DIAGNOSIS — R278 Other lack of coordination: Secondary | ICD-10-CM | POA: Diagnosis present

## 2020-10-05 DIAGNOSIS — R2681 Unsteadiness on feet: Secondary | ICD-10-CM | POA: Diagnosis present

## 2020-10-05 NOTE — Therapy (Signed)
[place] MAIN [facility] SERVICES [address] [place], [place], [address] Phone: [phone]   Fax:  [phone]  Physical Therapy Treatment  Patient Details  Name: Vanessa Medina MRN: [id number] Date of Birth: 1971/08/04 Referring Provider (PT): Dr. Erling Cruz   Encounter Date: 10/05/2020   PT End of Session - 10/05/20 0931    Visit Number 7    Number of Visits 17    Date for PT Re-Evaluation 10/27/20    Authorization Type eval: 12/29    PT Start Time 0935    PT Stop Time 1017    PT Time Calculation (min) 42 min    Equipment Utilized During Treatment Gait belt    Activity Tolerance Patient tolerated treatment well    Behavior During Therapy [facility] for tasks assessed/performed           Past Medical History:  Diagnosis Date  . Anemia   . Cervical cancer ([place])   . Family history of adverse reaction to anesthesia     " my mother takes a long time time wake up"  . GERD (gastroesophageal reflux disease)   . Headache    migraine  . History of shingles   . Hypertension   . Migraine   . Multiple allergies   . Obesity   . Osteoarthritis    left hip  . PONV (postoperative nausea and vomiting)   . Sleep apnea    does not wear CPAP  . Wears glasses     Past Surgical History:  Procedure Laterality Date  . ABDOMINAL HYSTERECTOMY    . APPENDECTOMY    . BILIOPANCREATIC DIVERSION     with duodenal switch laparoscopic   . CHOLECYSTECTOMY    . DIAGNOSTIC LAPAROSCOPY     LOA  . ESOPHAGOGASTRODUODENOSCOPY (EGD) WITH PROPOFOL N/A 07/25/2017   Procedure: ESOPHAGOGASTRODUODENOSCOPY (EGD) WITH PROPOFOL;  Surgeon: Manya Silvas, MD;  Location: [facility] ENDOSCOPY;  Service: Endoscopy;  Laterality: N/A;  . KNEE ARTHROSCOPY    . LAPAROSCOPIC GASTRIC SLEEVE RESECTION WITH HIATAL HERNIA REPAIR    . TONSILLECTOMY    . TOTAL HIP ARTHROPLASTY Left 01/26/2016   Procedure: LEFT TOTAL HIP ARTHROPLASTY ANTERIOR APPROACH;  Surgeon: Leandrew Koyanagi, MD;  Location:  [place];  Service: Orthopedics;  Laterality: Left;  . TRANSFORAMINAL LUMBAR INTERBODY FUSION (TLIF) WITH PEDICLE SCREW FIXATION 3 LEVEL  08/2019   Basil Dess, MD L2-L5    There were no vitals filed for this visit.  Therapeutic Exercise:  Ambulation through clinic with SPC, WC-follow, 4x30 ft  CGA    Lateral stepping at bars, CGA - x multiple repetitions    Seated hip abduction GTB- 3x15    Standing tolerance at bar with B UE support, CGA 4x60 sec  Neuromuscular Re-education:  Step-ups onto foam at bar - CGA, BUE support x multiple repetitions  Standing at bar WBOS EO without UE support, 2x60 sec, CGA Standing at bar WBOS EC, no UE assist, 4x30 Sec, CGA  Assessment: Pt able to ambulate 4x30 feet today with SPC, WC follow and CGA without sx. Pt only with one instance of standing up at bar and reporting pre-syncope-like feelings that resolved with a few minutes of standing. Pt will benefit from further skilled therapy to improve B LE strength, balance, and capacity for functional activities.    PT Short Term Goals - 09/01/20 1349      PT SHORT TERM GOAL #1   Title Pt will be independent with HEP in order to  improve strength and balance in order to decrease fall risk and improve function at home and work.    Time 6    Period Weeks    Status New    Target Date 10/13/20             PT Long Term Goals - 09/01/20 1349      PT LONG TERM GOAL #1   Title Patient (< 50 years old) will complete five times sit to stand test in < 10 seconds indicating an increased LE strength and improved balance.    Baseline 12/29: 38.45s    Time 12    Period Weeks    Status New    Target Date 11/24/20      PT LONG TERM GOAL #2   Title Pt will decrease TUG to below 14 seconds/decrease in order to demonstrate decreased fall risk.    Baseline 12/29: 32.94s    Time 12    Period Weeks    Status New    Target Date 11/24/20      PT LONG TERM GOAL #3   Title Pt will decrease DHI score by at least  18 points in order to demonstrate clinically significant reduction in disability    Baseline 12/29: 30    Time 12    Period Weeks    Status New    Target Date 11/24/20      PT LONG TERM GOAL #4   Title Patient will increase 10 meter walk test to >1.53m/s as to improve gait speed for better community ambulation and to reduce fall risk.    Baseline 12/29: 0.37 m/s    Time 12    Period Weeks    Status New    Target Date 11/24/20      PT LONG TERM GOAL #5   Title Patient will increase FOTO score to equal to or greater than 64 to demonstrate statistically significant improvement in mobility and quality of life.    Baseline 12/29: 52    Time 12    Period Weeks    Status New    Target Date 11/24/20            Patient will benefit from skilled therapeutic intervention in order to improve the following deficits and impairments:  Abnormal gait,Dizziness,Improper body mechanics,Decreased mobility,Cardiopulmonary status limiting activity,Decreased activity tolerance,Decreased endurance,Decreased range of motion,Decreased strength,Decreased balance,Decreased safety awareness,Difficulty walking,Impaired flexibility,Obesity  Visit Diagnosis: Muscle weakness (generalized)  Unsteadiness on feet  Other abnormalities of gait and mobility     Problem List Patient Active Problem List   Diagnosis Date Noted  . Chronic migraine without aura without status migrainosus, not intractable   . Hypokalemia   . Adjustment reaction with anxiety and depression   . Spinal cord injury, lumbar, without spinal bone injury, sequela ([place]) 08/18/2020  . Paraparesis ([place])   . Post-operative pain   . Hypotension   . Slow transit constipation   . AKI (acute kidney injury) ([place])   . Right leg weakness 08/11/2020  . DDD (degenerative disc disease), lumbar 09/02/2019    Class: Chronic  . Degenerative disc disease, lumbar 09/02/2019  . Chest tightness 09/23/2018  . Bradycardia 09/23/2018  . Labile blood  pressure 03/08/2018  . Chronic low back pain 09/03/2017  . History of total hip replacement, left 01/26/2016  . EDEMA 04/28/2008  . LEG PAIN, BILATERAL 12/16/2007  . CERVICAL CANCER 07/02/2007  . MORBID OBESITY 07/02/2007  . DEPRESSION 07/02/2007  . COMMON MIGRAINE 07/02/2007  . ALLERGIC  RHINITIS 07/02/2007  . ASTHMA 07/02/2007  . GERD 07/02/2007  . ELEVATED BLOOD PRESSURE WITHOUT DIAGNOSIS OF HYPERTENSION 07/02/2007   Ricard Dillon PT, DPT  10/05/2020, 12:42 PM  [place] MAIN [facility] SERVICES [address] [place], [place], [address] Phone: [phone]   Fax:  [phone]  Name: Vanessa Medina MRN: [id number] Date of Birth: 04/21/1971

## 2020-10-05 NOTE — Therapy (Signed)
[place] MAIN [facility] SERVICES [address] [place], [place], [address] Phone: [phone]   Fax:  [phone]  Occupational Therapy Treatment  Patient Details  Name: Vanessa Medina MRN: [id number] Date of Birth: 05/28/1971 No data recorded  Encounter Date: 10/05/2020   OT End of Session - 10/05/20 0938    Visit Number 7    Number of Visits 24    Date for OT Re-Evaluation 11/29/20    Authorization Type Progress report period starting 09/06/2020    OT Start Time 0845    OT Stop Time 0930    OT Time Calculation (min) 45 min    Activity Tolerance Patient tolerated treatment well    Behavior During Therapy [facility] for tasks assessed/performed           Past Medical History:  Diagnosis Date  . Anemia   . Cervical cancer ([place])   . Family history of adverse reaction to anesthesia     " my mother takes a long time time wake up"  . GERD (gastroesophageal reflux disease)   . Headache    migraine  . History of shingles   . Hypertension   . Migraine   . Multiple allergies   . Obesity   . Osteoarthritis    left hip  . PONV (postoperative nausea and vomiting)   . Sleep apnea    does not wear CPAP  . Wears glasses     Past Surgical History:  Procedure Laterality Date  . ABDOMINAL HYSTERECTOMY    . APPENDECTOMY    . BILIOPANCREATIC DIVERSION     with duodenal switch laparoscopic   . CHOLECYSTECTOMY    . DIAGNOSTIC LAPAROSCOPY     LOA  . ESOPHAGOGASTRODUODENOSCOPY (EGD) WITH PROPOFOL N/A 07/25/2017   Procedure: ESOPHAGOGASTRODUODENOSCOPY (EGD) WITH PROPOFOL;  Surgeon: Manya Silvas, MD;  Location: [facility] ENDOSCOPY;  Service: Endoscopy;  Laterality: N/A;  . KNEE ARTHROSCOPY    . LAPAROSCOPIC GASTRIC SLEEVE RESECTION WITH HIATAL HERNIA REPAIR    . TONSILLECTOMY    . TOTAL HIP ARTHROPLASTY Left 01/26/2016   Procedure: LEFT TOTAL HIP ARTHROPLASTY ANTERIOR APPROACH;  Surgeon: Leandrew Koyanagi, MD;  Location: [place];  Service: Orthopedics;   Laterality: Left;  . TRANSFORAMINAL LUMBAR INTERBODY FUSION (TLIF) WITH PEDICLE SCREW FIXATION 3 LEVEL  08/2019   Basil Dess, MD L2-L5    There were no vitals filed for this visit.   Subjective Assessment - 10/05/20 0937    Subjective  Pt. reports that she fell last week.    Patient is accompanied by: Family member    Pertinent History Pt. is a 50 y.o. female who had a Spinal cord stimular trial placement in November 2021. Pt. was admitted for Spinal cord stimulator placement on 08/11/2020. Pt. sustained a spinal cord Injury T12-L1, CVA. Pt. received inpatient rehab services.    Currently in Pain? Yes    Pain Score 9     Pain Location Back    Pain Orientation Right;Left    Pain Descriptors / Indicators Sharp    Pain Radiating Towards BLEs          OT TREATMENT    Neuro muscular re-education:   Pt. worked on right hand [facility] skills grasping gasping small earring backings, and placing them onto a small resistive dowel.   Therapeutic Exercise:  Pt. performed bilateral gross gripping with grip strengthener. Pt. worked on sustaining grip while grasping pegs and reaching at various heights. The gripper was set at 11.2#  for the right hand, and left hands. Pt. worked on pinch strengthening in the bilateral hands for lateral, and 3pt. pinch using yellow, red, green, and blue resistive clips. Pt. worked on placing the clips at various vertical and horizontal angles. Tactile and verbal cues were required for eliciting the desired movement.  Pt. reports that she had a hard time with pain in the LEs, and back over the past few days. Pt. reports that she has MD appointments scheduled with all of her physicians. Pt. presents with limited bilateral UE strength, and [facility] skills.  Pt. continues to present with increased shakiness after strengthening with the right hand as she fatigues. Pt. continues to work on improving UE strength, and [facility] skills in order to work towards improving LUE functioning,  and maximizing independence with ADLs, and IADL tasks.                        OT Education - 10/05/20 2947    Education Details Ther. ex.    Person(s) Educated Patient    Methods Explanation    Comprehension Verbalized understanding               OT Long Term Goals - 09/06/20 1629      OT LONG TERM GOAL #1   Title Pt. will improve FOTO score by 2 points to improve Focus on functional therapeutic outcomes  for FOTO related daily tasks.    Baseline Eval: FOTO score 54    Time 12    Period Weeks    Status New    Target Date 11/29/20      OT LONG TERM GOAL #2   Title Pt. will independently, and efficiently type email correspondence inpreparation for work repated typing tasks.    Baseline Eval: Pt. has difficulty typing  accurately, and efficiently    Time 12    Period Weeks    Status New    Target Date 11/29/20      OT LONG TERM GOAL #3   Title Pt. will independently use scissors to accuraltely cut fabric for crafting projects.    Baseline Eval: pt. has difficulty using scissors for  cutting  fabric staright.    Time 12    Period Weeks    Status New    Target Date 11/29/20      OT LONG TERM GOAL #4   Title Pt. will improve activity tolerance to be able to complete light home making tasks with modified independence.    Baseline Eva: Pt. is a ble to initiate, and perform parts of making tasks with rest breaks.    Time 12    Period Weeks    Status New    Target Date 11/29/20      OT LONG TERM GOAL #5   Title Pt. will perform light meal perparation efficiently with modifed independence.    Baseline EVal: Pt. has difficulty    Time 12    Period Weeks    Status New    Target Date 11/29/20                 Plan - 10/05/20 6546    Clinical Impression Statement Pt. reports that she had a hard time with pain in the LEs, and back over the past few days. Pt. reports that she has MD appointments scheduled with all of her physicians. Pt. presents  with limited bilateral UE strength, and [facility] skills.  Pt. continues to present with increased  shakiness after strengthening with the right hand as she fatigues. Pt. continues to work on improving UE strength, and [facility] skills in order to work towards improving LUE functioning, and maximizing independence with ADLs, and IADL tasks.    Occupational performance deficits (Please refer to evaluation for details): ADL's;IADL's    Rehab Potential Good    Clinical Decision Making Several treatment options, min-mod task modification necessary    Comorbidities Affecting Occupational Performance: May have comorbidities impacting occupational performance    Modification or Assistance to Complete Evaluation  Min-Moderate modification of tasks or assist with assess necessary to complete eval    OT Frequency 2x / week    OT Duration 12 weeks    OT Treatment/Interventions Self-care/ADL training;Therapeutic exercise;Patient/family education;DME and/or AE instruction;Neuromuscular education    Consulted and Agree with Plan of Care Patient           Patient will benefit from skilled therapeutic intervention in order to improve the following deficits and impairments:           Visit Diagnosis: Muscle weakness (generalized)  Other lack of coordination    Problem List Patient Active Problem List   Diagnosis Date Noted  . Chronic migraine without aura without status migrainosus, not intractable   . Hypokalemia   . Adjustment reaction with anxiety and depression   . Spinal cord injury, lumbar, without spinal bone injury, sequela ([place]) 08/18/2020  . Paraparesis ([place])   . Post-operative pain   . Hypotension   . Slow transit constipation   . AKI (acute kidney injury) ([place])   . Right leg weakness 08/11/2020  . DDD (degenerative disc disease), lumbar 09/02/2019    Class: Chronic  . Degenerative disc disease, lumbar 09/02/2019  . Chest tightness 09/23/2018  . Bradycardia 09/23/2018  . Labile blood  pressure 03/08/2018  . Chronic low back pain 09/03/2017  . History of total hip replacement, left 01/26/2016  . EDEMA 04/28/2008  . LEG PAIN, BILATERAL 12/16/2007  . CERVICAL CANCER 07/02/2007  . MORBID OBESITY 07/02/2007  . DEPRESSION 07/02/2007  . COMMON MIGRAINE 07/02/2007  . ALLERGIC RHINITIS 07/02/2007  . ASTHMA 07/02/2007  . GERD 07/02/2007  . ELEVATED BLOOD PRESSURE WITHOUT DIAGNOSIS OF HYPERTENSION 07/02/2007    Harrel Carina, MS, OTR/L 10/05/2020, 9:52 AM  [place] MAIN [facility] SERVICES [address] [place], [place], [address] Phone: [phone]   Fax:  [phone]  Name: Vanessa Medina MRN: [id number] Date of Birth: Jan 07, 1971

## 2020-10-07 ENCOUNTER — Encounter: Payer: Self-pay | Admitting: Occupational Therapy

## 2020-10-07 ENCOUNTER — Other Ambulatory Visit: Payer: Self-pay

## 2020-10-07 ENCOUNTER — Encounter: Payer: Self-pay | Admitting: Specialist

## 2020-10-07 ENCOUNTER — Ambulatory Visit: Payer: 59 | Admitting: Occupational Therapy

## 2020-10-07 ENCOUNTER — Ambulatory Visit: Payer: 59 | Admitting: Specialist

## 2020-10-07 ENCOUNTER — Ambulatory Visit: Payer: 59

## 2020-10-07 VITALS — BP 101/53 | HR 71

## 2020-10-07 VITALS — BP 109/61 | HR 74 | Ht 64.0 in | Wt 236.0 lb

## 2020-10-07 DIAGNOSIS — G8929 Other chronic pain: Secondary | ICD-10-CM

## 2020-10-07 DIAGNOSIS — R2681 Unsteadiness on feet: Secondary | ICD-10-CM

## 2020-10-07 DIAGNOSIS — M533 Sacrococcygeal disorders, not elsewhere classified: Secondary | ICD-10-CM | POA: Diagnosis not present

## 2020-10-07 DIAGNOSIS — G8220 Paraplegia, unspecified: Secondary | ICD-10-CM

## 2020-10-07 DIAGNOSIS — M6281 Muscle weakness (generalized): Secondary | ICD-10-CM

## 2020-10-07 DIAGNOSIS — G903 Multi-system degeneration of the autonomic nervous system: Secondary | ICD-10-CM

## 2020-10-07 DIAGNOSIS — R2689 Other abnormalities of gait and mobility: Secondary | ICD-10-CM

## 2020-10-07 DIAGNOSIS — Z981 Arthrodesis status: Secondary | ICD-10-CM | POA: Diagnosis not present

## 2020-10-07 DIAGNOSIS — R278 Other lack of coordination: Secondary | ICD-10-CM

## 2020-10-07 DIAGNOSIS — M47814 Spondylosis without myelopathy or radiculopathy, thoracic region: Secondary | ICD-10-CM | POA: Diagnosis not present

## 2020-10-07 DIAGNOSIS — M5136 Other intervertebral disc degeneration, lumbar region: Secondary | ICD-10-CM

## 2020-10-07 DIAGNOSIS — I9589 Other hypotension: Secondary | ICD-10-CM

## 2020-10-07 MED ORDER — BACLOFEN 10 MG PO TABS: 10.0000 mg | ORAL_TABLET | Freq: Every day | ORAL | 0 refills | Status: DC

## 2020-10-07 NOTE — Patient Instructions (Addendum)
Continue to work on a gradual strengthening program of the arms and legs.  Rehabilitation for spinal cord injury.  Baclofen for muscle spasm and for tremor.

## 2020-10-07 NOTE — Progress Notes (Signed)
Office Visit Note   Patient: Vanessa Medina           Date of Birth: Jan 27, 1971           MRN: [id number] Visit Date: 10/07/2020              Requested by: Maryland Pink, MD [address] [place],  [place] [address] PCP: Maryland Pink, MD   Assessment & Plan: Visit Diagnoses:  1. Lumbar degenerative disc disease   2. Spondylosis of thoracic spine without myelopathy   3. Chronic left SI joint pain   4. S/P lumbar spinal fusion   5. Spinal paraparesis ([place])     Plan: Continue to work on a gradual strengthening program of the arms and legs.  Rehabilitation for spinal cord injury.  Baclofen for muscle spasm and for tremor.   Follow-Up Instructions: Return in about 4 weeks (around 11/04/2020).   Orders:  No orders of the defined types were placed in this encounter.  No orders of the defined types were placed in this encounter.     Procedures: No procedures performed   Clinical Data: No additional findings.   Subjective: Chief Complaint  Patient presents with  . Lower Back - Follow-up    50 year old female with history of lumbago and degenerative disc disease and spondylosis and is post lumbar fusion L2 to L5 with pedicle screws rods and cages. She underwent a SCS placement with intraop spinal cord injury. She has been in rehabilitation and is having right knee cap problems after falling down stairs and landing on the cement with the right knee Cap. She has a history of previous right ACL reconstruction with Patella tendon bone in [place].     Review of Systems   Objective: Vital Signs: BP 109/61 (BP Location: Left Arm, Patient Position: Sitting)   Pulse 74   Ht 5\' 4"  (1.626 m)   Wt 236 lb (107 kg)   BMI 40.51 kg/m   Physical Exam Constitutional:      Appearance: She is well-developed and well-nourished.  HENT:     Head: Normocephalic and atraumatic.  Eyes:     Extraocular Movements: EOM normal.     Pupils: Pupils are equal,  round, and reactive to light.  Pulmonary:     Effort: Pulmonary effort is normal.     Breath sounds: Normal breath sounds.  Abdominal:     General: Bowel sounds are normal.     Palpations: Abdomen is soft.  Musculoskeletal:        General: Normal range of motion.     Cervical back: Normal range of motion and neck supple.  Skin:    General: Skin is warm and dry.  Neurological:     Mental Status: She is alert and oriented to person, place, and time.  Psychiatric:        Mood and Affect: Mood and affect normal.        Behavior: Behavior normal.        Thought Content: Thought content normal.        Judgment: Judgment normal.     Ortho Exam  Specialty Comments:  No specialty comments available.  Imaging: No results found.   PMFS History: Patient Active Problem List   Diagnosis Date Noted  . DDD (degenerative disc disease), lumbar 09/02/2019    Priority: High    Class: Chronic  . Chronic migraine without aura without status migrainosus, not intractable   . Hypokalemia   .  Adjustment reaction with anxiety and depression   . Spinal cord injury, lumbar, without spinal bone injury, sequela ([place]) 08/18/2020  . Paraparesis ([place])   . Post-operative pain   . Hypotension   . Slow transit constipation   . AKI (acute kidney injury) ([place])   . Right leg weakness 08/11/2020  . Degenerative disc disease, lumbar 09/02/2019  . Chest tightness 09/23/2018  . Bradycardia 09/23/2018  . Labile blood pressure 03/08/2018  . Chronic low back pain 09/03/2017  . History of total hip replacement, left 01/26/2016  . EDEMA 04/28/2008  . LEG PAIN, BILATERAL 12/16/2007  . CERVICAL CANCER 07/02/2007  . MORBID OBESITY 07/02/2007  . DEPRESSION 07/02/2007  . COMMON MIGRAINE 07/02/2007  . ALLERGIC RHINITIS 07/02/2007  . ASTHMA 07/02/2007  . GERD 07/02/2007  . ELEVATED BLOOD PRESSURE WITHOUT DIAGNOSIS OF HYPERTENSION 07/02/2007   Past Medical History:  Diagnosis Date  . Anemia   . Cervical  cancer ([place])   . Family history of adverse reaction to anesthesia     " my mother takes a long time time wake up"  . GERD (gastroesophageal reflux disease)   . Headache    migraine  . History of shingles   . Hypertension   . Migraine   . Multiple allergies   . Obesity   . Osteoarthritis    left hip  . PONV (postoperative nausea and vomiting)   . Sleep apnea    does not wear CPAP  . Wears glasses     Family History  Problem Relation Age of Onset  . Renal Disease Mother   . Hypertension Mother   . Sudden Cardiac Death Mother   . Heart failure Mother   . Valvular heart disease Mother   . Heart disease Mother   . Stroke Brother   . Heart attack Brother 64  . Diabetes Other   . Breast cancer Cousin        paternal side    Past Surgical History:  Procedure Laterality Date  . ABDOMINAL HYSTERECTOMY    . APPENDECTOMY    . BILIOPANCREATIC DIVERSION     with duodenal switch laparoscopic   . CHOLECYSTECTOMY    . DIAGNOSTIC LAPAROSCOPY     LOA  . ESOPHAGOGASTRODUODENOSCOPY (EGD) WITH PROPOFOL N/A 07/25/2017   Procedure: ESOPHAGOGASTRODUODENOSCOPY (EGD) WITH PROPOFOL;  Surgeon: Manya Silvas, MD;  Location: [facility] ENDOSCOPY;  Service: Endoscopy;  Laterality: N/A;  . KNEE ARTHROSCOPY    . LAPAROSCOPIC GASTRIC SLEEVE RESECTION WITH HIATAL HERNIA REPAIR    . TONSILLECTOMY    . TOTAL HIP ARTHROPLASTY Left 01/26/2016   Procedure: LEFT TOTAL HIP ARTHROPLASTY ANTERIOR APPROACH;  Surgeon: Leandrew Koyanagi, MD;  Location: [place];  Service: Orthopedics;  Laterality: Left;  . TRANSFORAMINAL LUMBAR INTERBODY FUSION (TLIF) WITH PEDICLE SCREW FIXATION 3 LEVEL  08/2019   Basil Dess, MD L2-L5   Social History   Occupational History  . Not on file  Tobacco Use  . Smoking status: Never Smoker  . Smokeless tobacco: Never Used  Vaping Use  . Vaping Use: Never used  Substance and Sexual Activity  . Alcohol use: Not Currently    Comment: occasional wine  . Drug use: No  . Sexual activity:  Not Currently

## 2020-10-07 NOTE — Therapy (Signed)
[place] MAIN [facility] SERVICES [address] [place], [place], [address] Phone: [phone]   Fax:  [phone]  Occupational Therapy Treatment  Patient Details  Name: Vanessa Medina MRN: [id number] Date of Birth: 03-08-1971 No data recorded  Encounter Date: 10/07/2020   OT End of Session - 10/07/20 0915    Visit Number 8    Number of Visits 24    Date for OT Re-Evaluation 11/29/20    Authorization Type Progress report period starting 09/06/2020    OT Start Time 0845    OT Stop Time 0930    OT Time Calculation (min) 45 min    Activity Tolerance Patient tolerated treatment well    Behavior During Therapy [facility] for tasks assessed/performed           Past Medical History:  Diagnosis Date  . Anemia   . Cervical cancer ([place])   . Family history of adverse reaction to anesthesia     " my mother takes a long time time wake up"  . GERD (gastroesophageal reflux disease)   . Headache    migraine  . History of shingles   . Hypertension   . Migraine   . Multiple allergies   . Obesity   . Osteoarthritis    left hip  . PONV (postoperative nausea and vomiting)   . Sleep apnea    does not wear CPAP  . Wears glasses     Past Surgical History:  Procedure Laterality Date  . ABDOMINAL HYSTERECTOMY    . APPENDECTOMY    . BILIOPANCREATIC DIVERSION     with duodenal switch laparoscopic   . CHOLECYSTECTOMY    . DIAGNOSTIC LAPAROSCOPY     LOA  . ESOPHAGOGASTRODUODENOSCOPY (EGD) WITH PROPOFOL N/A 07/25/2017   Procedure: ESOPHAGOGASTRODUODENOSCOPY (EGD) WITH PROPOFOL;  Surgeon: Manya Silvas, MD;  Location: [facility] ENDOSCOPY;  Service: Endoscopy;  Laterality: N/A;  . KNEE ARTHROSCOPY    . LAPAROSCOPIC GASTRIC SLEEVE RESECTION WITH HIATAL HERNIA REPAIR    . TONSILLECTOMY    . TOTAL HIP ARTHROPLASTY Left 01/26/2016   Procedure: LEFT TOTAL HIP ARTHROPLASTY ANTERIOR APPROACH;  Surgeon: Leandrew Koyanagi, MD;  Location: [place];  Service: Orthopedics;   Laterality: Left;  . TRANSFORAMINAL LUMBAR INTERBODY FUSION (TLIF) WITH PEDICLE SCREW FIXATION 3 LEVEL  08/2019   Basil Dess, MD L2-L5    There were no vitals filed for this visit.   Subjective Assessment - 10/07/20 0911    Patient is accompanied by: Family member    Pertinent History Pt. is a 50 y.o. female who had a Spinal cord stimular trial placement in November 2021. Pt. was admitted for Spinal cord stimulator placement on 08/11/2020. Pt. sustained a spinal cord Injury T12-L1, CVA. Pt. received inpatient rehab services.    Currently in Pain? Yes          OT TREATMENT    Neuro muscular re-education:  Pt. performed [facility] tasks using the Grooved pegboard. Pt. worked on grasping the grooved pegs from a horizontal position, and moving the pegs to a vertical position in the hand to prepare for placing them in the grooved slot. Pt. worked on translatory movements moving the pegs through her hand to the tip of her 2nd digit, and thumb. Pt. worked on [facility] skills grasping 1" sticks, 1/4" collars, and 1/4" washers. Pt. worked on storing the objects in the palm, and translatory skills moving the items from the palm of the hand to the tip of the 2nd  digit, and thumb. Pt. worked on removing the pegs using bilateral alternating hand patterns. Pt. worked on [employer] for a 2 min. typing test for 14 wpm with 100% accuracy.  Pt. reports not feeling well yesterday having low BP, and spent most of the day reclined in her recliner. Pt. reports BP has improved this morning. Pt. reports concern about shakiness in her hands. Pt. Reports that she has an MD appointment today, and multiple follow-up appointments next week. Pt. education was provided about proximal forearm stability for more motor control during fine motor coordination skills.  Pt. Continues to work on improving UE strength, and [facility] skills in order to work towards improving, and maximizing independence with ADLs, and  IADLs.                         OT Education - 10/07/20 0915    Education Details [facility]    Person(s) Educated Patient    Methods Explanation    Comprehension Verbalized understanding               OT Long Term Goals - 09/06/20 1629      OT LONG TERM GOAL #1   Title Pt. will improve FOTO score by 2 points to improve Focus on functional therapeutic outcomes  for FOTO related daily tasks.    Baseline Eval: FOTO score 54    Time 12    Period Weeks    Status New    Target Date 11/29/20      OT LONG TERM GOAL #2   Title Pt. will independently, and efficiently type email correspondence inpreparation for work repated typing tasks.    Baseline Eval: Pt. has difficulty typing  accurately, and efficiently    Time 12    Period Weeks    Status New    Target Date 11/29/20      OT LONG TERM GOAL #3   Title Pt. will independently use scissors to accuraltely cut fabric for crafting projects.    Baseline Eval: pt. has difficulty using scissors for  cutting  fabric staright.    Time 12    Period Weeks    Status New    Target Date 11/29/20      OT LONG TERM GOAL #4   Title Pt. will improve activity tolerance to be able to complete light home making tasks with modified independence.    Baseline Eva: Pt. is a ble to initiate, and perform parts of making tasks with rest breaks.    Time 12    Period Weeks    Status New    Target Date 11/29/20      OT LONG TERM GOAL #5   Title Pt. will perform light meal perparation efficiently with modifed independence.    Baseline EVal: Pt. has difficulty    Time 12    Period Weeks    Status New    Target Date 11/29/20                 Plan - 10/07/20 0916    Clinical Impression Statement Pt. reports not feeling well yesterday having low BP, and spent most of the day reclined in her recliner. Pt. reports BP has improved this morning. Pt. reports concern about shakiness in her hands. Pt. Reports that she has an MD  appointment today, and multiple follow-up appointments next week. Pt. education was provided about proximal forearm stability for more motor control during fine motor coordination skills.  Pt. Continues to work on improving UE strength, and [facility] skills in order to work towards improving, and maximizing independence with ADLs, and IADLs.   Occupational performance deficits (Please refer to evaluation for details): ADL's;IADL's    Rehab Potential Good    Clinical Decision Making Several treatment options, min-mod task modification necessary    Comorbidities Affecting Occupational Performance: May have comorbidities impacting occupational performance    Modification or Assistance to Complete Evaluation  Min-Moderate modification of tasks or assist with assess necessary to complete eval    OT Frequency 2x / week    OT Duration 12 weeks    OT Treatment/Interventions Self-care/ADL training;Therapeutic exercise;Patient/family education;DME and/or AE instruction;Neuromuscular education    Consulted and Agree with Plan of Care Patient           Patient will benefit from skilled therapeutic intervention in order to improve the following deficits and impairments:           Visit Diagnosis: Muscle weakness (generalized)  Other lack of coordination    Problem List Patient Active Problem List   Diagnosis Date Noted  . Chronic migraine without aura without status migrainosus, not intractable   . Hypokalemia   . Adjustment reaction with anxiety and depression   . Spinal cord injury, lumbar, without spinal bone injury, sequela ([place]) 08/18/2020  . Paraparesis ([place])   . Post-operative pain   . Hypotension   . Slow transit constipation   . AKI (acute kidney injury) ([place])   . Right leg weakness 08/11/2020  . DDD (degenerative disc disease), lumbar 09/02/2019    Class: Chronic  . Degenerative disc disease, lumbar 09/02/2019  . Chest tightness 09/23/2018  . Bradycardia 09/23/2018  . Labile  blood pressure 03/08/2018  . Chronic low back pain 09/03/2017  . History of total hip replacement, left 01/26/2016  . EDEMA 04/28/2008  . LEG PAIN, BILATERAL 12/16/2007  . CERVICAL CANCER 07/02/2007  . MORBID OBESITY 07/02/2007  . DEPRESSION 07/02/2007  . COMMON MIGRAINE 07/02/2007  . ALLERGIC RHINITIS 07/02/2007  . ASTHMA 07/02/2007  . GERD 07/02/2007  . ELEVATED BLOOD PRESSURE WITHOUT DIAGNOSIS OF HYPERTENSION 07/02/2007    Harrel Carina, MS, OTR/L 10/07/2020, 9:42 AM  [place] MAIN [facility] SERVICES [address] [place], [place], [address] Phone: [phone]   Fax:  [phone]  Name: Vanessa Medina MRN: [id number] Date of Birth: March 27, 1971

## 2020-10-07 NOTE — Therapy (Signed)
[place] MAIN [facility] SERVICES [address] [place], [place], [address] Phone: [phone]   Fax:  [phone]  Physical Therapy Treatment  Patient Details  Name: Vanessa Medina MRN: [id number] Date of Birth: [birth year] Referring Provider (PT): Dr. Erling Cruz   Encounter Date: 10/07/2020   PT End of Session - 10/07/20 1100    Visit Number 8    Number of Visits 17    Date for PT Re-Evaluation 10/27/20    Authorization Type eval: 12/29    PT Start Time 0931    PT Stop Time 1015    PT Time Calculation (min) 44 min    Equipment Utilized During Treatment Gait belt    Activity Tolerance Patient tolerated treatment well;Patient limited by fatigue;No increased pain    Behavior During Therapy WFL for tasks assessed/performed           Past Medical History:  Diagnosis Date  . Anemia   . Cervical cancer ([place])   . Family history of adverse reaction to anesthesia     " my mother takes a long time time wake up"  . GERD (gastroesophageal reflux disease)   . Headache    migraine  . History of shingles   . Hypertension   . Migraine   . Multiple allergies   . Obesity   . Osteoarthritis    left hip  . PONV (postoperative nausea and vomiting)   . Sleep apnea    does not wear CPAP  . Wears glasses     Past Surgical History:  Procedure Laterality Date  . ABDOMINAL HYSTERECTOMY    . APPENDECTOMY    . BILIOPANCREATIC DIVERSION     with duodenal switch laparoscopic   . CHOLECYSTECTOMY    . DIAGNOSTIC LAPAROSCOPY     LOA  . ESOPHAGOGASTRODUODENOSCOPY (EGD) WITH PROPOFOL N/A 07/25/2017   Procedure: ESOPHAGOGASTRODUODENOSCOPY (EGD) WITH PROPOFOL;  Surgeon: Manya Silvas, MD;  Location: [facility] ENDOSCOPY;  Service: Endoscopy;  Laterality: N/A;  . KNEE ARTHROSCOPY    . LAPAROSCOPIC GASTRIC SLEEVE RESECTION WITH HIATAL HERNIA REPAIR    . TONSILLECTOMY    . TOTAL HIP ARTHROPLASTY Left 01/26/2016   Procedure: LEFT TOTAL HIP ARTHROPLASTY ANTERIOR  APPROACH;  Surgeon: Leandrew Koyanagi, MD;  Location: [place];  Service: Orthopedics;  Laterality: Left;  . TRANSFORAMINAL LUMBAR INTERBODY FUSION (TLIF) WITH PEDICLE SCREW FIXATION 3 LEVEL  08/2019   Basil Dess, MD L2-L5    Vitals:   10/07/20 0942  BP: (!) 101/53  Pulse: 71      Today's Treatment:  Seated marches 2x20 Seated ankle pumps 2x20  Green ball raises 2x10 Standing tolerance x 4 minutes, CGA Standing marches at [employer] - 3x15, GA Standing heel raises - x15  Nustep x 5 min, close supervision  Pt with fair tolerance for above therex, however, performed reps slowly.  Assessment: Session limited today secondary to pt BP sx. Gradually progressed pt from seated therex to standing tolerance to standing therex and nustep for cardiovascular endurance. Her BP following exercises was 101/58. Pt tolerated treatment well and reported only one instance of feeling "off" but that it resolved. PT eduated pt on importance of following up with physician today regarding her low BP, and going to ED/Urgent care if BP drops too low. Pt also reports her family can take her to ED/Urgent care if necessary. Pt will benefit from further skilled therapy to improve LE strength, balance and functional capacity to increase QOL and safety with ADLs.  PT Education - 10/07/20 1059    Education Details Pt educated on standing marches at [employer]) Educated Patient    Methods Explanation;Demonstration;Verbal cues    Comprehension Verbalized understanding;Returned demonstration            PT Short Term Goals - 09/01/20 1349      PT SHORT TERM GOAL #1   Title Pt will be independent with HEP in order to improve strength and balance in order to decrease fall risk and improve function at home and work.    Time 6    Period Weeks    Status New    Target Date 10/13/20             PT Long Term Goals - 09/01/20 1349      PT LONG TERM GOAL #1   Title Patient (< 51 years old) will complete  five times sit to stand test in < 10 seconds indicating an increased LE strength and improved balance.    Baseline 12/29: 38.45s    Time 12    Period Weeks    Status New    Target Date 11/24/20      PT LONG TERM GOAL #2   Title Pt will decrease TUG to below 14 seconds/decrease in order to demonstrate decreased fall risk.    Baseline 12/29: 32.94s    Time 12    Period Weeks    Status New    Target Date 11/24/20      PT LONG TERM GOAL #3   Title Pt will decrease DHI score by at least 18 points in order to demonstrate clinically significant reduction in disability    Baseline 12/29: 30    Time 12    Period Weeks    Status New    Target Date 11/24/20      PT LONG TERM GOAL #4   Title Patient will increase 10 meter walk test to >1.2m/s as to improve gait speed for better community ambulation and to reduce fall risk.    Baseline 12/29: 0.37 m/s    Time 12    Period Weeks    Status New    Target Date 11/24/20      PT LONG TERM GOAL #5   Title Patient will increase FOTO score to equal to or greater than 64 to demonstrate statistically significant improvement in mobility and quality of life.    Baseline 12/29: 52    Time 12    Period Weeks    Status New    Target Date 11/24/20            Plan - 10/07/20 1206    Clinical Impression Statement Session limited today secondary to pt BP sx. Gradually progressed pt from seated therex to standing tolerance to standing therex and nustep for cardiovascular endurance. Her BP following exercises was 101/58. Pt tolerated treatment well and reported only one instance of feeling "off" but that it resolved. PT eduated pt on importance of following up with physician today regarding her low BP, and going to ED/Urgent care if BP drops too low. Pt also reports her family can take her to ED/Urgent care if necessary. Pt will benefit from further skilled therapy to improve LE strength, balance and functional capacity to increase QOL and safety with ADLs.     Personal Factors and Comorbidities Comorbidity 3+;Time since onset of injury/illness/exacerbation    Comorbidities HYPOtension, paraparesis, SCI without spinal bone injury, DDD, cervical cancer, acute kidney injury,  GERD, asthma, bradycardia, L total hip replacement, hypokalemia    Examination-Activity Limitations Squat;Lift;Stairs;Bend;Stand;Reach Overhead;Carry;Transfers    Examination-Participation Restrictions Cleaning;Laundry;Meal Prep;Community Activity;Driving;Occupation    Stability/Clinical Decision Making Evolving/Moderate complexity    Rehab Potential Fair    PT Frequency 2x / week    PT Duration 12 weeks    PT Treatment/Interventions ADLs/Self Care Home Management;Aquatic Therapy;Gait training;Stair training;Functional mobility training;Therapeutic activities;Therapeutic exercise;Balance training;Moist Heat;Manual techniques;Patient/family education;Neuromuscular re-education;Passive range of motion;Energy conservation;Vestibular;Joint Manipulations;Spinal Manipulations    PT Next Visit Plan Instruction in pain modulation techniques, progress balance interventions, ambulate through clinic    PT Home Exercise Plan Initiate HEP; progress balance exercises; endurance with nustep/ambulation to tolerance    Consulted and Agree with Plan of Care Patient           Patient will benefit from skilled therapeutic intervention in order to improve the following deficits and impairments:  Abnormal gait,Dizziness,Improper body mechanics,Decreased mobility,Cardiopulmonary status limiting activity,Decreased activity tolerance,Decreased endurance,Decreased range of motion,Decreased strength,Decreased balance,Decreased safety awareness,Difficulty walking,Impaired flexibility,Obesity  Visit Diagnosis: Muscle weakness (generalized)  Unsteadiness on feet  Other abnormalities of gait and mobility  Neurogenic orthostatic hypotension ([place])     Problem List Patient Active Problem List    Diagnosis Date Noted  . Chronic migraine without aura without status migrainosus, not intractable   . Hypokalemia   . Adjustment reaction with anxiety and depression   . Spinal cord injury, lumbar, without spinal bone injury, sequela ([place]) 08/18/2020  . Paraparesis ([place])   . Post-operative pain   . Hypotension   . Slow transit constipation   . AKI (acute kidney injury) ([place])   . Right leg weakness 08/11/2020  . DDD (degenerative disc disease), lumbar 09/02/2019    Class: Chronic  . Degenerative disc disease, lumbar 09/02/2019  . Chest tightness 09/23/2018  . Bradycardia 09/23/2018  . Labile blood pressure 03/08/2018  . Chronic low back pain 09/03/2017  . History of total hip replacement, left 01/26/2016  . EDEMA 04/28/2008  . LEG PAIN, BILATERAL 12/16/2007  . CERVICAL CANCER 07/02/2007  . MORBID OBESITY 07/02/2007  . DEPRESSION 07/02/2007  . COMMON MIGRAINE 07/02/2007  . ALLERGIC RHINITIS 07/02/2007  . ASTHMA 07/02/2007  . GERD 07/02/2007  . ELEVATED BLOOD PRESSURE WITHOUT DIAGNOSIS OF HYPERTENSION 07/02/2007   Ricard Dillon PT, DPT  10/07/2020, 12:11 PM  [place] MAIN [facility] SERVICES [address] [place], [place], [address] Phone: [phone]   Fax:  [phone]  Name: LAMIS BEHRMANN MRN: [id number] Date of Birth: Aug 25, 1971

## 2020-10-11 ENCOUNTER — Encounter: Payer: Self-pay | Admitting: Specialist

## 2020-10-12 ENCOUNTER — Encounter: Payer: Self-pay | Admitting: Occupational Therapy

## 2020-10-12 ENCOUNTER — Ambulatory Visit: Payer: 59 | Admitting: Occupational Therapy

## 2020-10-12 ENCOUNTER — Ambulatory Visit: Payer: 59

## 2020-10-12 ENCOUNTER — Encounter: Payer: Self-pay | Admitting: Radiology

## 2020-10-12 ENCOUNTER — Other Ambulatory Visit: Payer: Self-pay

## 2020-10-12 VITALS — BP 99/56 | HR 64

## 2020-10-12 DIAGNOSIS — R2681 Unsteadiness on feet: Secondary | ICD-10-CM

## 2020-10-12 DIAGNOSIS — M6281 Muscle weakness (generalized): Secondary | ICD-10-CM

## 2020-10-12 DIAGNOSIS — R278 Other lack of coordination: Secondary | ICD-10-CM

## 2020-10-12 DIAGNOSIS — R2689 Other abnormalities of gait and mobility: Secondary | ICD-10-CM

## 2020-10-12 NOTE — Therapy (Signed)
[place] MAIN [facility] SERVICES [address] [place], [place], [address] Phone: [phone]   Fax:  [phone]  Occupational Therapy Treatment  Patient Details  Name: Vanessa Medina MRN: [id number] Date of Birth: [birth year] No data recorded  Encounter Date: 10/12/2020   OT End of Session - 10/12/20 0948    Visit Number 9    Number of Visits 24    Date for OT Re-Evaluation 11/29/20    Authorization Type Progress report period starting 09/06/2020    OT Start Time 0845    OT Stop Time 0930    OT Time Calculation (min) 45 min    Activity Tolerance Patient tolerated treatment well    Behavior During Therapy [facility] for tasks assessed/performed           Past Medical History:  Diagnosis Date  . Anemia   . Cervical cancer ([place])   . Family history of adverse reaction to anesthesia     " my mother takes a long time time wake up"  . GERD (gastroesophageal reflux disease)   . Headache    migraine  . History of shingles   . Hypertension   . Migraine   . Multiple allergies   . Obesity   . Osteoarthritis    left hip  . PONV (postoperative nausea and vomiting)   . Sleep apnea    does not wear CPAP  . Wears glasses     Past Surgical History:  Procedure Laterality Date  . ABDOMINAL HYSTERECTOMY    . APPENDECTOMY    . BILIOPANCREATIC DIVERSION     with duodenal switch laparoscopic   . CHOLECYSTECTOMY    . DIAGNOSTIC LAPAROSCOPY     LOA  . ESOPHAGOGASTRODUODENOSCOPY (EGD) WITH PROPOFOL N/A 07/25/2017   Procedure: ESOPHAGOGASTRODUODENOSCOPY (EGD) WITH PROPOFOL;  Surgeon: Manya Silvas, MD;  Location: [facility] ENDOSCOPY;  Service: Endoscopy;  Laterality: N/A;  . KNEE ARTHROSCOPY    . LAPAROSCOPIC GASTRIC SLEEVE RESECTION WITH HIATAL HERNIA REPAIR    . TONSILLECTOMY    . TOTAL HIP ARTHROPLASTY Left 01/26/2016   Procedure: LEFT TOTAL HIP ARTHROPLASTY ANTERIOR APPROACH;  Surgeon: Leandrew Koyanagi, MD;  Location: [facility] [place];  Service: Orthopedics;   Laterality: Left;  . TRANSFORAMINAL LUMBAR INTERBODY FUSION (TLIF) WITH PEDICLE SCREW FIXATION 3 LEVEL  08/2019   Basil Dess, MD L2-L5    There were no vitals filed for this visit.   Subjective Assessment - 10/12/20 0947    Subjective  Pt. reports that it took her 12 hours to complete a task that used to take her 1 hour.    Patient is accompanied by: Family member    Pertinent History Pt. is a 50 y.o. female who had a Spinal cord stimular trial placement in November 2021. Pt. was admitted for Spinal cord stimulator placement on 08/11/2020. Pt. sustained a spinal cord Injury T12-L1, CVA. Pt. received inpatient rehab services.    Currently in Pain? Yes    Pain Score 9     Pain Location Hand    Pain Orientation Right;Left    Pain Score 8    Pain Location Back    Pain Type Chronic pain;Acute pain            OT TREATMENT    Neuro muscular re-ed:  Pt. performed [place] tasks using the Grooved pegboard. Pt. worked on grasping the grooved pegs from a horizontal position, and moving the pegs to a vertical position in the hand to prepare for placing  them in the grooved slot. Pt. worked on [facility] skills grasping 1" sticks, 1/4" collars, and 1/4" washers. Pt. worked on storing the objects in the palm.  Pt. worked on removing the pegs using bilateral alternating hand patterns. Pt. worked on grasping the pegs, and storing them in the palm of her hand.  Pt. reports having a bad morning with increased pain, and tremors in her right hand after working on crafting, and typing on the computer at home. Pt.'s BP 96/56 seated. Pt. Reports that tasks at home are taking much longer to complete, where as prior to her hospitalization pt. was able to complete the tasks in significantly less time. Pt.reports that she is not able to feel the pedal on the sewing machine in order to accurately grade the amount of pressure needed to control the sewing machine efficiently.  Pt. continues to work on improving right hand [facility]  skills in order to work towards improving, and maximizing independence with ADLs, and IADLs.                          OT Education - 10/12/20 0948    Education Details [facility] skills    Person(s) Educated Patient    Methods Explanation    Comprehension Verbalized understanding               OT Long Term Goals - 09/06/20 1629      OT LONG TERM GOAL #1   Title Pt. will improve FOTO score by 2 points to improve Focus on functional therapeutic outcomes  for FOTO related daily tasks.    Baseline Eval: FOTO score 54    Time 12    Period Weeks    Status New    Target Date 11/29/20      OT LONG TERM GOAL #2   Title Pt. will independently, and efficiently type email correspondence inpreparation for work repated typing tasks.    Baseline Eval: Pt. has difficulty typing  accurately, and efficiently    Time 12    Period Weeks    Status New    Target Date 11/29/20      OT LONG TERM GOAL #3   Title Pt. will independently use scissors to accuraltely cut fabric for crafting projects.    Baseline Eval: pt. has difficulty using scissors for  cutting  fabric staright.    Time 12    Period Weeks    Status New    Target Date 11/29/20      OT LONG TERM GOAL #4   Title Pt. will improve activity tolerance to be able to complete light home making tasks with modified independence.    Baseline Eva: Pt. is a ble to initiate, and perform parts of making tasks with rest breaks.    Time 12    Period Weeks    Status New    Target Date 11/29/20      OT LONG TERM GOAL #5   Title Pt. will perform light meal perparation efficiently with modifed independence.    Baseline EVal: Pt. has difficulty    Time 12    Period Weeks    Status New    Target Date 11/29/20                 Plan - 10/12/20 1009    Clinical Impression Statement Pt. reports having a bad morning with increased pain, and tremors in her right hand after working on crafting, and typing  on the computer  at home. Pt.'s BP 96/56 seated. Pt. Reports that tasks at home are taking much longer to complete, where as prior to her hospitalization pt. was able to complete the tasks in significantly less time. Pt.reports that she is not able to feel the pedal on the sewing machine in order to accurately grade the amount of pressure needed to control the sewing machine efficiently.  Pt. continues to work on improving right hand [facility] skills in order to work towards improving, and maximizing independence with ADLs, and IADLs.    Occupational performance deficits (Please refer to evaluation for details): ADL's;IADL's    Rehab Potential Good    Clinical Decision Making Several treatment options, min-mod task modification necessary    Comorbidities Affecting Occupational Performance: May have comorbidities impacting occupational performance    Modification or Assistance to Complete Evaluation  Min-Moderate modification of tasks or assist with assess necessary to complete eval    OT Frequency 2x / week    OT Duration 12 weeks    OT Treatment/Interventions Self-care/ADL training;Therapeutic exercise;Patient/family education;DME and/or AE instruction;Neuromuscular education    Consulted and Agree with Plan of Care Patient           Patient will benefit from skilled therapeutic intervention in order to improve the following deficits and impairments:           Visit Diagnosis: Muscle weakness (generalized)  Other lack of coordination    Problem List Patient Active Problem List   Diagnosis Date Noted  . Chronic migraine without aura without status migrainosus, not intractable   . Hypokalemia   . Adjustment reaction with anxiety and depression   . Spinal cord injury, lumbar, without spinal bone injury, sequela ([place]) 08/18/2020  . Paraparesis ([place])   . Post-operative pain   . Hypotension   . Slow transit constipation   . AKI (acute kidney injury) ([place])   . Right leg weakness 08/11/2020  . DDD  (degenerative disc disease), lumbar 09/02/2019    Class: Chronic  . Degenerative disc disease, lumbar 09/02/2019  . Chest tightness 09/23/2018  . Bradycardia 09/23/2018  . Labile blood pressure 03/08/2018  . Chronic low back pain 09/03/2017  . History of total hip replacement, left 01/26/2016  . EDEMA 04/28/2008  . LEG PAIN, BILATERAL 12/16/2007  . CERVICAL CANCER 07/02/2007  . MORBID OBESITY 07/02/2007  . DEPRESSION 07/02/2007  . COMMON MIGRAINE 07/02/2007  . ALLERGIC RHINITIS 07/02/2007  . ASTHMA 07/02/2007  . GERD 07/02/2007  . ELEVATED BLOOD PRESSURE WITHOUT DIAGNOSIS OF HYPERTENSION 07/02/2007    Harrel Carina, MS, OTR/L 10/12/2020, 10:10 AM  [place] MAIN [facility] SERVICES [address] [place], [place], [address] Phone: [phone]   Fax:  [phone]  Name: Vanessa Medina MRN: [id number] Date of Birth: [birth year]

## 2020-10-12 NOTE — Therapy (Signed)
[place] MAIN [facility] SERVICES [address] [place], [place], [address] Phone: [phone]   Fax:  [phone]  Physical Therapy Treatment  Patient Details  Name: Vanessa Medina MRN: [id number] Date of Birth: Sep 25, 1970 Referring Provider (PT): Dr. Erling Cruz   Encounter Date: 10/12/2020   PT End of Session - 10/12/20 1049    Visit Number 9    Number of Visits 17    Date for PT Re-Evaluation 10/27/20    Authorization Type eval: 12/29    PT Start Time 0932    PT Stop Time 1023    PT Time Calculation (min) 51 min    Equipment Utilized During Treatment Gait belt    Activity Tolerance Patient tolerated treatment well;Patient limited by fatigue;No increased pain;Patient limited by lethargy    Behavior During Therapy [facility] for tasks assessed/performed           Past Medical History:  Diagnosis Date  . Anemia   . Cervical cancer ([place])   . Family history of adverse reaction to anesthesia     " my mother takes a long time time wake up"  . GERD (gastroesophageal reflux disease)   . Headache    migraine  . History of shingles   . Hypertension   . Migraine   . Multiple allergies   . Obesity   . Osteoarthritis    left hip  . PONV (postoperative nausea and vomiting)   . Sleep apnea    does not wear CPAP  . Wears glasses     Past Surgical History:  Procedure Laterality Date  . ABDOMINAL HYSTERECTOMY    . APPENDECTOMY    . BILIOPANCREATIC DIVERSION     with duodenal switch laparoscopic   . CHOLECYSTECTOMY    . DIAGNOSTIC LAPAROSCOPY     LOA  . ESOPHAGOGASTRODUODENOSCOPY (EGD) WITH PROPOFOL N/A 07/25/2017   Procedure: ESOPHAGOGASTRODUODENOSCOPY (EGD) WITH PROPOFOL;  Surgeon: Manya Silvas, MD;  Location: [facility] ENDOSCOPY;  Service: Endoscopy;  Laterality: N/A;  . KNEE ARTHROSCOPY    . LAPAROSCOPIC GASTRIC SLEEVE RESECTION WITH HIATAL HERNIA REPAIR    . TONSILLECTOMY    . TOTAL HIP ARTHROPLASTY Left 01/26/2016   Procedure: LEFT TOTAL  HIP ARTHROPLASTY ANTERIOR APPROACH;  Surgeon: Leandrew Koyanagi, MD;  Location: [place];  Service: Orthopedics;  Laterality: Left;  . 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 She reports she fell asleep at 8:30 and that her BP has been very low. She says sewing a bear, which normally takes her an hour and a half took her the entire day, and she feels she does not have good control over her R LE any more, which she uses to sew. Pt reports no pain currently but that BLE pain reached 10/10 last night. Pt reports R LE numbness. Pt has appointment with her physician tomorrow where she is going to discuss her pain and BP.    Pertinent History 50 year old female with history of HTN, migraines, morbid obesity--BMI-41, chronic hypotension, multiple back surgeries with chronic pain with LLE lumbar radiculopathy who was scheduled to have spinal  cord stimulator placed by Dr. Davy Pique on 08/11/2020 but unable to perform surgery due to scar tissue.  History taken from patient and chart review.  Post op on awakening, she had excruciating pain RLE with plegia and and allodynia. She was admitted for work up by Dr. Ronnald Ramp from surgical center on 08/11/2020. Thoracic MRI done revealing subtle increased T2/STIR intensity distal cord at T12-L1 level suspicious for edema/acute spinal cord injury, no CSF leak as well as prior PLIF L2-L5 with residual foraminal protrusion L3/5 potentially irritating right L3 nerve. She was started on IV Dilaudid, gabapentin as well as IV Decadron for management of pain, headaches and severe neuropathy.  She continues to have pain in RLE but is having some motor return, has  constant headache, decreased hearing in right> left ear, constipation with nausea as well as epigastric pain and weakness. Therapy ongoing and CIR recommended due to functional decline.  Of note, she reports 50 lbs weigh loss in the past 3-4 months--due to issues with N/V/intake. Has had two falls last month---no recall of incident leading to fall question due to syncope. Was in process of work up for renal disease. She has had issue with LLE weakness with pain for the past year and being followed by Dr. Ernestina Patches. Did well with stimulator trial.    How long can you sit comfortably? 1 hour    How long can you stand comfortably? 8 mins    How long can you walk comfortably? 20-30 minutes    Currently in Pain? No/denies    Pain Onset More than a month ago   pt with chronic back pain from over a month ago but with acute pain as well due to recent fall at home         Treatment:  Proprioception testing of B LEs/ankles - WNL  Vitals: BP L UE, pt seated 99/56; HR 64 SPO2% 100; reports light-headedness at beginning of session.   Therapeutic Exercise:  Ankle pumps - 2x20; emphasis on squeeze at end range, hands-on assist from PT with later reps  Seated LAQ with 2.5# on LLE; no weight on RLE - 2x20 B LES; hands-on assist from PT with RLE; Pt reports "medium: difficulty LLE  and "hard" on RLE.  Seated dorsiflexion with YTB - 2x20; pt with decreasing ROM with reps BLEs, hands-on assist provided by PT, second set YTB removed halfway through second set.  Ankle inversion YTB - 2x20 B LEs, hands-on assist with RLE; pt performs reps slowly. VC/TC/demonstration provided.  Baps board forward/backward/side-to-side R LE -  X multiple reps each. VC/TC/Demonstration provided.  Yellow wobble pad forward/bacjward/side-to-side/CW and CC - x multiple reps R LE. Frequent TC. Demonstration and VC also provided.  PT provided pt education on warm-up with seated exercise to increase BP and then progress to more demanding  therex. Pt also educated on testing (see above).    Assessment: Beginning of PT session focused on discussing rehab goals with pt as pt reporting more trouble with RLE strength, BP and its impact on her QOL and ability to sew. Time also spent assessing pt vitals (BP 99/56; HR 64 SPO2% 100), and ankle proprioception (WNL), and providing education on new strength/motor control therex for RLE. Pt reports fatigue throughout session and performs majority of reps slowly today with difficulty.  Pt will benefit from further skilled therapy to improve LE strength, motor control, and functional capacity to improve ease with activities and QOL.    PT Short Term Goals - 09/01/20 1349  PT SHORT TERM GOAL #1   Title Pt will be independent with HEP in order to improve strength and balance in order to decrease fall risk and improve function at home and work.    Time 6    Period Weeks    Status New    Target Date 10/13/20             PT Long Term Goals - 09/01/20 1349      PT LONG TERM GOAL #1   Title Patient (< 50 years old) will complete five times sit to stand test in < 10 seconds indicating an increased LE strength and improved balance.    Baseline 12/29: 38.45s    Time 12    Period Weeks    Status New    Target Date 11/24/20      PT LONG TERM GOAL #2   Title Pt will decrease TUG to below 14 seconds/decrease in order to demonstrate decreased fall risk.    Baseline 12/29: 32.94s    Time 12    Period Weeks    Status New    Target Date 11/24/20      PT LONG TERM GOAL #3   Title Pt will decrease DHI score by at least 18 points in order to demonstrate clinically significant reduction in disability    Baseline 12/29: 30    Time 12    Period Weeks    Status New    Target Date 11/24/20      PT LONG TERM GOAL #4   Title Patient will increase 10 meter walk test to >1.65m/s as to improve gait speed for better community ambulation and to reduce fall risk.    Baseline 12/29: 0.37 m/s    Time  12    Period Weeks    Status New    Target Date 11/24/20      PT LONG TERM GOAL #5   Title Patient will increase FOTO score to equal to or greater than 64 to demonstrate statistically significant improvement in mobility and quality of life.    Baseline 12/29: 52    Time 12    Period Weeks    Status New    Target Date 11/24/20           Patient will benefit from skilled therapeutic intervention in order to improve the following deficits and impairments:  Abnormal gait,Dizziness,Improper body mechanics,Decreased mobility,Cardiopulmonary status limiting activity,Decreased activity tolerance,Decreased endurance,Decreased range of motion,Decreased strength,Decreased balance,Decreased safety awareness,Difficulty walking,Impaired flexibility,Obesity  Visit Diagnosis: Muscle weakness (generalized)  Other lack of coordination  Unsteadiness on feet  Other abnormalities of gait and mobility     Problem List Patient Active Problem List   Diagnosis Date Noted  . Chronic migraine without aura without status migrainosus, not intractable   . Hypokalemia   . Adjustment reaction with anxiety and depression   . Spinal cord injury, lumbar, without spinal bone injury, sequela ([place]) 08/18/2020  . Paraparesis ([place])   . Post-operative pain   . Hypotension   . Slow transit constipation   . AKI (acute kidney injury) ([place])   . Right leg weakness 08/11/2020  . DDD (degenerative disc disease), lumbar 09/02/2019    Class: Chronic  . Degenerative disc disease, lumbar 09/02/2019  . Chest tightness 09/23/2018  . Bradycardia 09/23/2018  . Labile blood pressure 03/08/2018  . Chronic low back pain 09/03/2017  . History of total hip replacement, left 01/26/2016  . EDEMA 04/28/2008  . LEG PAIN, BILATERAL 12/16/2007  .  CERVICAL CANCER 07/02/2007  . MORBID OBESITY 07/02/2007  . DEPRESSION 07/02/2007  . COMMON MIGRAINE 07/02/2007  . ALLERGIC RHINITIS 07/02/2007  . ASTHMA 07/02/2007  . GERD  07/02/2007  . ELEVATED BLOOD PRESSURE WITHOUT DIAGNOSIS OF HYPERTENSION 07/02/2007   Ricard [place] PT, DPT  10/12/2020, 11:10 AM  [place] MAIN [facility] SERVICES [address] [place], [place], [address] Phone: [phone]   Fax:  [phone]  Name: Vanessa Medina MRN: [id number] Date of Birth: [birth year]

## 2020-10-13 ENCOUNTER — Other Ambulatory Visit: Payer: Self-pay

## 2020-10-13 ENCOUNTER — Encounter: Payer: Self-pay | Admitting: Physical Medicine and Rehabilitation

## 2020-10-13 ENCOUNTER — Ambulatory Visit: Payer: 59 | Admitting: Physical Medicine and Rehabilitation

## 2020-10-13 VITALS — BP 88/58 | HR 75

## 2020-10-13 DIAGNOSIS — G894 Chronic pain syndrome: Secondary | ICD-10-CM

## 2020-10-13 DIAGNOSIS — S34109S Unspecified injury to unspecified level of lumbar spinal cord, sequela: Secondary | ICD-10-CM | POA: Diagnosis not present

## 2020-10-13 DIAGNOSIS — Z981 Arthrodesis status: Secondary | ICD-10-CM

## 2020-10-13 DIAGNOSIS — M961 Postlaminectomy syndrome, not elsewhere classified: Secondary | ICD-10-CM | POA: Diagnosis not present

## 2020-10-13 DIAGNOSIS — S34109D Unspecified injury to unspecified level of lumbar spinal cord, subsequent encounter: Secondary | ICD-10-CM | POA: Diagnosis not present

## 2020-10-13 DIAGNOSIS — G8220 Paraplegia, unspecified: Secondary | ICD-10-CM

## 2020-10-13 NOTE — Progress Notes (Signed)
Has an appointment with Dr. Gean Quint today to discuss disability forms. Wants to discuss transferring care. Wants to have SCS placed at some point but not ready for that yet.  Numeric Pain Rating Scale and Functional Assessment Average Pain 9 Pain Right Now 6 My pain is constant, sharp, stabbing, tingling and aching Pain is worse with: walking, sitting and standing Pain improves with: rest, therapy/exercise and medication   In the last MONTH (on 0-10 scale) has pain interfered with the following?  1. General activity like being  able to carry out your everyday physical activities such as walking, climbing stairs, carrying groceries, or moving a chair?  Rating(10)  2. Relation with others like being able to carry out your usual social activities and roles such as  activities at home, at work and in your community. Rating(10)  3. Enjoyment of life such that you have  been bothered by emotional problems such as feeling anxious, depressed or irritable?  Rating(10)

## 2020-10-14 ENCOUNTER — Ambulatory Visit: Payer: 59

## 2020-10-14 ENCOUNTER — Encounter: Payer: Self-pay | Admitting: Occupational Therapy

## 2020-10-14 ENCOUNTER — Ambulatory Visit: Payer: 59 | Admitting: Occupational Therapy

## 2020-10-14 VITALS — BP 117/51 | HR 67

## 2020-10-14 DIAGNOSIS — R278 Other lack of coordination: Secondary | ICD-10-CM

## 2020-10-14 DIAGNOSIS — R2689 Other abnormalities of gait and mobility: Secondary | ICD-10-CM

## 2020-10-14 DIAGNOSIS — R2681 Unsteadiness on feet: Secondary | ICD-10-CM

## 2020-10-14 DIAGNOSIS — M6281 Muscle weakness (generalized): Secondary | ICD-10-CM

## 2020-10-14 NOTE — Therapy (Signed)
[place] MAIN [facility] SERVICES [address] [place], [place], [address] Phone: [phone]   Fax:  [phone]  Physical Therapy Treatment/Physical Therapy Progress Note   Dates of reporting period  09/01/2020   to   10/14/2020   Patient Details  Name: Vanessa Medina MRN: [id number] Date of Birth: May 20, 1971 Referring Provider (PT): Dr. Erling Cruz   Encounter Date: 10/14/2020   PT End of Session - 10/14/20 1647    Visit Number 10    Number of Visits 17    Date for PT Re-Evaluation 10/27/20    Authorization Type eval: 12/29    PT Start Time 0936    PT Stop Time 1017    PT Time Calculation (min) 41 min    Equipment Utilized During Treatment Gait belt    Activity Tolerance Patient tolerated treatment well;No increased pain    Behavior During Therapy WFL for tasks assessed/performed           Past Medical History:  Diagnosis Date  . Anemia   . Cervical cancer ([place])   . Family history of adverse reaction to anesthesia     " my mother takes a long time time wake up"  . GERD (gastroesophageal reflux disease)   . Headache    migraine  . History of shingles   . Hypertension   . Migraine   . Multiple allergies   . Obesity   . Osteoarthritis    left hip  . PONV (postoperative nausea and vomiting)   . Sleep apnea    does not wear CPAP  . Wears glasses     Past Surgical History:  Procedure Laterality Date  . ABDOMINAL HYSTERECTOMY    . APPENDECTOMY    . BILIOPANCREATIC DIVERSION     with duodenal switch laparoscopic   . CHOLECYSTECTOMY    . DIAGNOSTIC LAPAROSCOPY     LOA  . ESOPHAGOGASTRODUODENOSCOPY (EGD) WITH PROPOFOL N/A 07/25/2017   Procedure: ESOPHAGOGASTRODUODENOSCOPY (EGD) WITH PROPOFOL;  Surgeon: Manya Silvas, MD;  Location: [facility] ENDOSCOPY;  Service: Endoscopy;  Laterality: N/A;  . KNEE ARTHROSCOPY    . LAPAROSCOPIC GASTRIC SLEEVE RESECTION WITH HIATAL HERNIA REPAIR    . TONSILLECTOMY    . TOTAL HIP ARTHROPLASTY  Left 01/26/2016   Procedure: LEFT TOTAL HIP ARTHROPLASTY ANTERIOR APPROACH;  Surgeon: Leandrew Koyanagi, MD;  Location: [place];  Service: Orthopedics;  Laterality: Left;  . TRANSFORAMINAL LUMBAR INTERBODY FUSION (TLIF) WITH PEDICLE SCREW FIXATION 3 LEVEL  08/2019   Basil Dess, MD L2-L5    Vitals:   10/14/20 1700  BP: (!) 117/51  Pulse: 67     Subjective Assessment - 10/14/20 1643    Subjective Pt reports BP was 88/56 yesterday and that she was feeling dizzy. She says she feels better today. She used her L foot to help her sew. Pt reports she woke up 1:30 am with "a lot of pain" and throbbing in R knee. She is seeing her physician on Monday.    Pertinent History 50 year old female with history of HTN, migraines, morbid obesity--BMI-41, chronic hypotension, multiple back surgeries with chronic pain with LLE lumbar radiculopathy who was scheduled to have spinal cord stimulator placed by Dr. Davy Pique on 08/11/2020 but unable to perform surgery due to scar tissue.  History taken from patient and chart review.  Post op on awakening, she had excruciating pain RLE with plegia and and allodynia. She was admitted for work up by Dr. Ronnald Ramp from surgical center on 08/11/2020.  Thoracic MRI done revealing subtle increased T2/STIR intensity distal cord at T12-L1 level suspicious for edema/acute spinal cord injury, no CSF leak as well as prior PLIF L2-L5 with residual foraminal protrusion L3/5 potentially irritating right L3 nerve. She was started on IV Dilaudid, gabapentin as well as IV Decadron for management of pain, headaches and severe neuropathy.  She continues to have pain in RLE but is having some motor return, has constant headache, decreased hearing in right> left ear, constipation with nausea as well as epigastric pain and weakness. Therapy ongoing and CIR recommended due to functional decline.  Of note, she reports 50 lbs weigh loss in the past 3-4 months--due to issues with N/V/intake. Has had two falls last  month---no recall of incident leading to fall question due to syncope. Was in process of work up for renal disease. She has had issue with LLE weakness with pain for the past year and being followed by Dr. Ernestina Patches. Did well with stimulator trial.    How long can you sit comfortably? 1 hour    How long can you stand comfortably? 8 mins    How long can you walk comfortably? 20-30 minutes    Currently in Pain? Yes    Pain Onset More than a month ago   pt with chronic back pain from over a month ago but with acute pain as well due to recent fall at home          Treatment: Reassessment of Goals this session  5xSTS: 42.8 seconds, CGA  TUG: 43.12 sec, using RW with chair-follow, CGA  DHI: score 62  10MWT: 0.38 m/s  FOTO: 45  Education provided to pt throughout all testing regarding outcomes and performance testing, how to perform with demonstration/VC/TC and indications for pt's functional mobility and therapy.    Seated, R LE wobble pad: x multiple reps for each of the following with hands-on assist, demonstration, VC/TC for technique Forward/backward/side-to-side  Counter-clockwise Clockwise  Assessment: Pt 5xSTS is 42.8 seconds, TUG 43.12 sec, DHI score 62, and 10MWT 0.38 m/s and FOTO score 45. These scores indicate impairments in LE strength and power, fall risk, and impaired gait speed/ability. Overall, pt shows decrease is scores on multiple assessments this session, possibly secondary to pt functional mobility affected by difficulties with her BP sx. Her 10MWT speed did modestly increase. Pt will be following-up with her physician further regarding her BP and has an appointment with them on Monday. Patient's condition has the potential to improve in response to therapy. Maximum improvement is yet to be obtained. The anticipated improvement is attainable and reasonable in a generally predictable time. Pt will benefit from continued skilled therapy to improve BLE strength and power, gait  and mobility in order to reduce fall risk and improve QOL.         PT Education - 10/14/20 1646    Education Details Pt educated on therapy re-assessment and ankle proprioception therex    Person(s) Educated Patient    Methods Explanation;Tactile cues;Verbal cues    Comprehension Verbalized understanding;Returned demonstration            PT Short Term Goals - 10/14/20 1649      PT SHORT TERM GOAL #1   Title Pt will be independent with HEP in order to improve strength and balance in order to decrease fall risk and improve function at home and work.    Baseline 10/14/2020 on-going/deferred    Time 6    Period Weeks    Status  On-going    Target Date 10/13/20             PT Long Term Goals - 10/14/20 1650      PT LONG TERM GOAL #1   Title Patient (< 105 years old) will complete five times sit to stand test in < 10 seconds indicating an increased LE strength and improved balance.    Baseline 12/29: 38.45s; 10/14/2020 42.8 seconds    Time 12    Period Weeks    Status On-going    Target Date 11/24/20      PT LONG TERM GOAL #2   Title Pt will decrease TUG to below 14 seconds/decrease in order to demonstrate decreased fall risk.    Baseline 12/29: 32.94s; 10/14/2020 43.12 sec    Time 12    Period Weeks    Status On-going    Target Date 11/24/20      PT LONG TERM GOAL #3   Title Pt will decrease DHI score by at least 18 points in order to demonstrate clinically significant reduction in disability    Baseline 12/29: 30; 10/14/2020 62, indicating severe handicap    Time 12    Period Weeks    Status New    Target Date 11/24/20      PT LONG TERM GOAL #4   Title Patient will increase 10 meter walk test to >1.106m/s as to improve gait speed for better community ambulation and to reduce fall risk.    Baseline 12/29: 0.37 m/s; 0.38 m/s    Time 12    Period Weeks    Status On-going    Target Date 11/24/20      PT LONG TERM GOAL #5   Title Patient will increase FOTO score to  equal to or greater than 64 to demonstrate statistically significant improvement in mobility and quality of life.    Baseline 12/29: 52; 10/14/2020 45    Time 12    Period Weeks    Status On-going    Target Date 11/24/20            Patient will benefit from skilled therapeutic intervention in order to improve the following deficits and impairments:  Abnormal gait,Dizziness,Improper body mechanics,Decreased mobility,Cardiopulmonary status limiting activity,Decreased activity tolerance,Decreased endurance,Decreased range of motion,Decreased strength,Decreased balance,Decreased safety awareness,Difficulty walking,Impaired flexibility,Obesity  Visit Diagnosis: Muscle weakness (generalized)  Other lack of coordination  Unsteadiness on feet  Other abnormalities of gait and mobility     Problem List Patient Active Problem List   Diagnosis Date Noted  . Chronic migraine without aura without status migrainosus, not intractable   . Hypokalemia   . Adjustment reaction with anxiety and depression   . Spinal cord injury, lumbar, without spinal bone injury, sequela ([place]) 08/18/2020  . Paraparesis ([place])   . Post-operative pain   . Hypotension   . Slow transit constipation   . AKI (acute kidney injury) ([place])   . Right leg weakness 08/11/2020  . DDD (degenerative disc disease), lumbar 09/02/2019    Class: Chronic  . Degenerative disc disease, lumbar 09/02/2019  . Chest tightness 09/23/2018  . Bradycardia 09/23/2018  . Labile blood pressure 03/08/2018  . Chronic low back pain 09/03/2017  . History of total hip replacement, left 01/26/2016  . EDEMA 04/28/2008  . LEG PAIN, BILATERAL 12/16/2007  . CERVICAL CANCER 07/02/2007  . MORBID OBESITY 07/02/2007  . DEPRESSION 07/02/2007  . COMMON MIGRAINE 07/02/2007  . ALLERGIC RHINITIS 07/02/2007  . ASTHMA 07/02/2007  . GERD 07/02/2007  .  ELEVATED BLOOD PRESSURE WITHOUT DIAGNOSIS OF HYPERTENSION 07/02/2007   Ricard Dillon PT, DPT 10/14/2020,  5:24 PM  [place] MAIN [facility] SERVICES [address] [place], [place], [address] Phone: [phone]   Fax:  [phone]  Name: Vanessa Medina MRN: [id number] Date of Birth: 05-12-1971

## 2020-10-14 NOTE — Therapy (Signed)
[place] MAIN [facility] SERVICES [address] [place], [place], [address] Phone: [phone]   Fax:  [phone]  Occupational Therapy Progress Note  Dates of reporting period  09/06/2020 to   10/14/2020  Patient Details  Name: Vanessa Medina MRN: [id number] Date of Birth: 1970-11-17 No data recorded  Encounter Date: 10/14/2020   OT End of Session - 10/14/20 0907    Visit Number 10    Number of Visits 24    Date for OT Re-Evaluation 11/29/20    Authorization Type Progress report period starting 10/14/2020    OT Start Time 0845    OT Stop Time 0930    OT Time Calculation (min) 45 min    Activity Tolerance Patient tolerated treatment well    Behavior During Therapy [facility] for tasks assessed/performed           Past Medical History:  Diagnosis Date  . Anemia   . Cervical cancer ([place])   . Family history of adverse reaction to anesthesia     " my mother takes a long time time wake up"  . GERD (gastroesophageal reflux disease)   . Headache    migraine  . History of shingles   . Hypertension   . Migraine   . Multiple allergies   . Obesity   . Osteoarthritis    left hip  . PONV (postoperative nausea and vomiting)   . Sleep apnea    does not wear CPAP  . Wears glasses     Past Surgical History:  Procedure Laterality Date  . ABDOMINAL HYSTERECTOMY    . APPENDECTOMY    . BILIOPANCREATIC DIVERSION     with duodenal switch laparoscopic   . CHOLECYSTECTOMY    . DIAGNOSTIC LAPAROSCOPY     LOA  . ESOPHAGOGASTRODUODENOSCOPY (EGD) WITH PROPOFOL N/A 07/25/2017   Procedure: ESOPHAGOGASTRODUODENOSCOPY (EGD) WITH PROPOFOL;  Surgeon: Manya Silvas, MD;  Location: [facility] ENDOSCOPY;  Service: Endoscopy;  Laterality: N/A;  . KNEE ARTHROSCOPY    . LAPAROSCOPIC GASTRIC SLEEVE RESECTION WITH HIATAL HERNIA REPAIR    . TONSILLECTOMY    . TOTAL HIP ARTHROPLASTY Left 01/26/2016   Procedure: LEFT TOTAL HIP ARTHROPLASTY ANTERIOR APPROACH;  Surgeon:  Leandrew Koyanagi, MD;  Location: [place];  Service: Orthopedics;  Laterality: Left;  . TRANSFORAMINAL LUMBAR INTERBODY FUSION (TLIF) WITH PEDICLE SCREW FIXATION 3 LEVEL  08/2019   Basil Dess, MD L2-L5    There were no vitals filed for this visit.   Subjective Assessment - 10/14/20 0958    Subjective  Pt. reports that she had a difficult day at her appointments yesterday.    Patient is accompanied by: Family member    Pertinent History Pt. is a 50 y.o. female who had a Spinal cord stimular trial placement in November 2021. Pt. was admitted for Spinal cord stimulator placement on 08/11/2020. Pt. sustained a spinal cord Injury T12-L1, CVA. Pt. received inpatient rehab services.    Patient Stated Goals Patient would like to be independent as possible.    Currently in Pain? No/denies              [facility] OT Assessment - 10/14/20 0001      Coordination   Right 9 Hole Peg Test 55    Left 9 Hole Peg Test 43 sec,      Hand Function   Right Hand Grip (lbs) 8    Right Hand Lateral Pinch 3 lbs    Right Hand 3 Point Pinch  2 lbs    Left Hand Grip (lbs) 8    Left Hand Lateral Pinch 3 lbs    Left 3 point pinch 2 lbs          Measurements were obtained, and goals were reviewed with the pt. Pt. has improved with right grip strength by 1# over this past reporting period. Pt. presents with decreased left gip strength, bilateral pinch strength, and bilateral [facility] skills. Pt. Has had increased reports of back, and LE pain, as well as intermittent thenar, and hypothenar pain, and increased tremors, shakiness in her bilateral hands. Pt. Tolerated gentle heat to her bilateral hands. Pt. performed [facility] skills alternating thumb on fingers, and fingers on thumb with cards using her right hand.  Pt. Reports attempting to wash dishes at home however is unable to tolerate performing the task in standing. Pt. has improved with toileting care tasks. Pt. Continues to present with difficulty using scissors to cut accurately,  and efficiently cut fabric Pt. has resumed working on projects in her craft room, however reports that tasks that used to take an hour, now are taking much longer, and up to 12 hours. Pt. has difficulty grading the appropriate pressure through the sewing machine pedal. Pt. Continues to have difficulty with typing efficiently. Pt. Performs a 10 min. Typing test with 98% accuracy at a rate of 12 wpm. Pt. Continues to work on improving UE strength, and [facility] skills in order to work towards improving, and maximizing independence with ADLs, and IADLs                  OT Education - 10/14/20 1000    Person(s) Educated Patient    Methods Explanation    Comprehension Verbalized understanding               OT Long Term Goals - 10/14/20 0921      OT LONG TERM GOAL #1   Title Pt. will improve FOTO score by 2 points to improve Focus on functional therapeutic outcomes  for FOTO related daily tasks.    Baseline Eval: FOTO score 54,    Time 12    Period Weeks    Status New    Target Date 11/29/20      OT LONG TERM GOAL #2   Title Pt. will independently, and efficiently type email correspondence inpreparation for work repated typing tasks.    Baseline Pt. continues to have difficulty typing  accurately, and efficiently    Time 12    Period Weeks    Status On-going    Target Date 11/29/20      OT LONG TERM GOAL #3   Title Pt. will independently use scissors to accurately cut fabric for crafting projects.    Baseline Pt. continues to have difficulty using scissors for  cutting  fabric staright.    Time 12    Period Weeks    Status On-going    Target Date 11/29/20      OT LONG TERM GOAL #4   Title Pt. will improve activity tolerance to be able to complete light home making tasks with modified independence.    Baseline 10/14/2020: Pt. has difficulty standing to wash dishes.    Time 12    Period Weeks    Status On-going    Target Date 11/29/20      OT LONG TERM GOAL #5   Title  Pt. will perform light meal perparation efficiently with modifed independence.    Baseline 10/14/2020: Pt.  has difficulty    Time 12    Period Weeks    Status On-going    Target Date 11/29/20                 Plan - 10/14/20 0907    Clinical Impression Statement Measurements were obtained, and goals were reviewed with the pt. Pt. has improved with right grip strength by 1# over this past reporting period. Pt. presents with decreased left gip strength, bilateral pinch strength, and bilateral [facility] skills. Pt. Has had increased reports of back, and LE pain, as well as intermittent thenar, and hypothenar pain, and increased tremors, shakiness in her bilateral hands. Pt. Tolerated gentle heat to her bilateral hands. Pt. performed [facility] skills alternating thumb on fingers, and fingers on thumb with cards using her right hand.  Pt. Reports attempting to wash dishes at home however is unable to tolerate performing the task in standing. Pt. has improved with toileting care tasks. Pt. Continues to present with difficulty using scissors to cut accurately, and efficiently cut fabric Pt. has resumed working on projects in her craft room, however reports that tasks that used to take an hour, now are taking much longer, and up to 12 hours. Pt. has difficulty grading the appropriate pressure through the sewing machine pedal. Pt. Continues to have difficulty with typing efficiently. Pt. Performs a 10 min. Typing test with 98% accuracy at a rate of 12 wpm. Pt. Continues to work on improving UE strength, and [place] skills in order to work towards improving, and maximizing independence with ADLs, and IADLs       Occupational performance deficits (Please refer to evaluation for details): ADL's;IADL's    Rehab Potential Good    Clinical Decision Making Several treatment options, min-mod task modification necessary    Comorbidities Affecting Occupational Performance: May have comorbidities impacting occupational performance     Modification or Assistance to Complete Evaluation  Min-Moderate modification of tasks or assist with assess necessary to complete eval    OT Frequency 2x / week    OT Duration 12 weeks    OT Treatment/Interventions Self-care/ADL training;Therapeutic exercise;Patient/family education;DME and/or AE instruction;Neuromuscular education           Patient will benefit from skilled therapeutic intervention in order to improve the following deficits and impairments:           Visit Diagnosis: Muscle weakness (generalized)  Other lack of coordination    Problem List Patient Active Problem List   Diagnosis Date Noted  . Chronic migraine without aura without status migrainosus, not intractable   . Hypokalemia   . Adjustment reaction with anxiety and depression   . Spinal cord injury, lumbar, without spinal bone injury, sequela ([place]) 08/18/2020  . Paraparesis ([place])   . Post-operative pain   . Hypotension   . Slow transit constipation   . AKI (acute kidney injury) ([place])   . Right leg weakness 08/11/2020  . DDD (degenerative disc disease), lumbar 09/02/2019    Class: Chronic  . Degenerative disc disease, lumbar 09/02/2019  . Chest tightness 09/23/2018  . Bradycardia 09/23/2018  . Labile blood pressure 03/08/2018  . Chronic low back pain 09/03/2017  . History of total hip replacement, left 01/26/2016  . EDEMA 04/28/2008  . LEG PAIN, BILATERAL 12/16/2007  . CERVICAL CANCER 07/02/2007  . MORBID OBESITY 07/02/2007  . DEPRESSION 07/02/2007  . COMMON MIGRAINE 07/02/2007  . ALLERGIC RHINITIS 07/02/2007  . ASTHMA 07/02/2007  . GERD 07/02/2007  . ELEVATED BLOOD PRESSURE WITHOUT  DIAGNOSIS OF HYPERTENSION 07/02/2007    Harrel Carina, MS, OTR/L 10/14/2020, 10:06 AM  [place] MAIN [facility] SERVICES [address] [place], [place], [address] Phone: [phone]   Fax:  [phone]  Name: FRANCESKA STRAHM MRN: [id number] Date of Birth:  March 29, 1971

## 2020-10-15 ENCOUNTER — Telehealth: Payer: Self-pay

## 2020-10-15 NOTE — Telephone Encounter (Signed)
Submitted VOB for Durolane, right knee. Pending BV. 

## 2020-10-17 ENCOUNTER — Encounter: Payer: Self-pay | Admitting: Physical Medicine and Rehabilitation

## 2020-10-17 NOTE — Progress Notes (Signed)
Vanessa Medina - 50 y.o. female MRN [id number]  Date of birth: 03-08-1971  Office Visit Note: Visit Date: 10/13/2020 PCP: Maryland Pink, MD Referred by: Maryland Pink, MD  Subjective: Chief Complaint  Patient presents with  . Lower Back - Pain   HPI: Vanessa Medina is a 50 y.o. female who comes in today For evaluation and management of ongoing chronic severe recalcitrant pain of the lower back and hips and legs and now with right leg pain severe status post spinal cord injury post attempt at spinal cord stimulator implantation.  By way of review of history I have only seen the patient on a couple of occasions.  We completed a couple of injections below her fusion at the request of Dr. Basil Dess that were very ineffectual.  We went on to see her at his request for potential for spinal cord stimulator trial.  My notes can be fully reviewed and that situation.  We felt like she was a good candidate post lumbar fusion with continued chronic severe pain.  At the time we were very booked up around the holidays and she was trying to get the procedure completed before the holidays and insurance deductibles etc.  We made a referral to Dr. Davy Pique at [facility].  She underwent very successful trial with very good relief of her symptoms and was very happy during the trial.  Unfortunately patient had increased symptoms of pain and weakness during the implantation procedure.  Subsequent imaging did show spinal cord injury.  Those images were reviewed today and reviewed in the notes below.  She reports that since that time she has had inpatient physical therapy and ongoing outpatient physical therapy in [place] near where she lives.  She has had unrelenting right leg pain which is new prior to the implantation procedure.  She reports nondermatomal pain down the leg.  She reports some weakness in the leg.  Watching her walk today she does not have foot drop she is able to  raise her foot with walking.  She ambulates with a cane.  She recently saw her physician here at the office Dr. Louanne Skye.  He did start her on baclofen as a muscle relaxer.  She continues with gabapentin just at night.  She has not had Lyrica.  She does take some topiramate for migraine headache.  She is really by history intolerant to a lot of opioid pain medications.  Also of note she does have an appointment today with Dr. Davy Pique for review of disability forms and ongoing treatment plans.  She also has an appointment with Dr. Courtney Heys at the [facility] for physical medicine redilatation.  This is for management of spinal cord injury.  Patient is having some orthostatic hypotension following spinal cord injury.  She is on appropriate medication for that.  She reports that she really does not want to see Dr. Davy Pique for further treatment and that she would like to see me in this office.  Review of Systems  Musculoskeletal: Positive for back pain.  Neurological: Positive for tingling, sensory change, focal weakness and weakness.  All other systems reviewed and are negative.  Otherwise per HPI.  Assessment & Plan: Visit Diagnoses:    ICD-10-CM   1. Spinal cord injury, lumbar, without spinal bone injury, sequela ([place])  S34.109S   2. S/P lumbar fusion  Z98.1   3. Chronic pain syndrome  G89.4   4. Post laminectomy syndrome  M96.1   5. Paraparesis (  [place])  G82.20      Plan: Findings:  Chronic history of low back pain with chronic recalcitrant pain in the hips status post lumbar fusion and now with severe right global leg pain status post attempted implantation of spinal cord stimulator now with spinal cord injury.  I discussed at length today for more than 20 minutes concerning the patient's condition.  Typically when we are looking at spinal cord stimulators we are at the level of saying that the last resort type of procedure although we are starting to do some of those earlier.  If it was just a  failure of the implantation to be done and she did so well with the trial I would fully recommend attempt at a different approach to the implantation through a different physician or may be even surgical laminotomy approach.  Now she is having this right global leg pain which could be more of a direct result of the spinal cord injury and/or part of a central pain type syndrome.  I did try to explain to her that she is early on in the process of this injury and can continue to make good progress from a pain and strength standpoint as well as orthostatic hypotension standpoint.  I discussed with her that I had be happy to see her in any way I can I am just probably out of options in terms of the pain itself other than medication type trial or appropriate referral.  Her best option is going to be to follow closely with Dr. Courtney Heys as she has good expertise in following along with spinal cord injury and symptoms associated with that.  I did suggest that baclofen could be used during the day if it did not make her sleepy or drowsy.  She may likely need to increase the gabapentin more during the day and we can do that slowly or potentially look at Lyrica.  Obviously this can be reviewed by Dr. Dagoberto Ligas as well.  She should continue with current physical therapy.  At this point I will be in a watchful waiting pattern and I am willing to help her in any way we can.    Meds & Orders: No orders of the defined types were placed in this encounter.  No orders of the defined types were placed in this encounter.   Follow-up: Return if symptoms worsen or fail to improve.   Procedures: No procedures performed      Clinical History: EXAM: MRI THORACIC AND LUMBAR SPINE WITHOUT AND WITH CONTRAST  TECHNIQUE: Multiplanar and multiecho pulse sequences of the thoracic and lumbar spine were obtained without and with intravenous contrast.  CONTRAST:  77mL GADAVIST GADOBUTROL 1 MMOL/ML IV SOLN  COMPARISON:   Previous MRIs from 06/16/2020 and 05/15/2021.  FINDINGS: MRI THORACIC SPINE FINDINGS   Vertebrae: Vertebral body height maintained without acute or chronic fracture. Bone marrow signal intensity within normal limits. No discrete or worrisome osseous lesions. No abnormal marrow edema or enhancement.  Cord: Normal signal and morphology. No epidural or subdural collections to suggest CSF leak. No abnormal enhancement.  Paraspinal and other soft tissues: Paraspinous soft tissues within normal limits. Dependent atelectatic changes noted within the lungs. Small simple cyst noted within the interpolar left kidney.  MRI LUMBAR SPINE FINDINGS  Segmentation: Standard. Lowest well-formed disc space labeled the L5-S1 level.  Alignment: Mild levoscoliosis. Trace chronic anterolisthesis of L4 on L5. Alignment otherwise normal preservation of the normal lumbar lordosis.  Vertebrae: Susceptibility artifact related to prior  PLIF at L2 through L5. Vertebral body height maintained without acute or chronic fracture. Bone marrow signal intensity within normal limits. No discrete or worrisome osseous lesions. No abnormal marrow edema or enhancement.  Conus medullaris: Extends to the L1 level. There is a subtle focus of increased T2/STIR signal intensity within the right aspect of the distal cord at the level of T12-L1 (series 6, image 9 on sagittal STIR sequence, series 8, image 3 on axial T2 weighted sequence). Associated subtle postcontrast enhancement (series 40, image 10). This is new from previous. Given the history of recent spinal stimulator placement, finding suspicious for a small focus of edema/acute spinal cord injury. Remainder of the visualized conus is otherwise normal as are the nerve roots of the cauda equina. No subdural or epidural collections to suggest CSF leak.  Paraspinal and other soft tissues: Soft tissue edema present within the posterior paraspinous soft  tissues extending from T12 through L1, likely postoperative in nature. No discrete collections. Chronic postoperative changes noted inferiorly. Subcentimeter simple cyst noted within the left kidney. Visualized visceral structures unremarkable.  Disc levels:  T12-L1: Unremarkable.  L1-2: Mild disc bulge with disc desiccation. Small chronic endplate Schmorl's node deformity. Mild facet hypertrophy. No spinal stenosis. Foramina remain patent.  L2-3: Prior PLIF with left facetectomy and foraminotomy. No recurrent canal or foraminal stenosis.  L3-4: Prior PLIF with left facetectomy and foraminotomy. No residual spinal stenosis. Left neural foramina remains patent. Residual right foraminal protrusion closely approximates the exiting right L3 nerve root without frank impingement or significant foraminal stenosis (series 5, image 5), stable.  L4-5: Prior PLIF with left facetectomy and foraminotomy. No residual spinal stenosis. Stable foraminal patency.  L5-S1: Negative interspace. Minimal facet hypertrophy. No canal or foraminal stenosis.  IMPRESSION: 1. Subtle focus of increased T2/STIR signal intensity with postcontrast enhancement within the right aspect of the distal cord at the level of T12-L1, new from previous exam. Given the history of recent spinal stimulator placement, finding suspicious for a small focus of edema/acute spinal cord injury. 2. No other acute abnormality within the thoracic or lumbar spine. No evidence for CSF leak. 3. Prior PLIF at L2 through L5 without residual or recurrent spinal stenosis. Residual right foraminal protrusion at L3-4, closely approximating and potentially irritating the exiting right L3 nerve root, stable.   Electronically Signed   By: Jeannine Boga M.D.   On: 08/12/2020 00:05   She reports that she has never smoked. She has never used smokeless tobacco. No results for input(s): HGBA1C, LABURIC in the last 8760  hours.  Objective:  VS:  HT:    WT:   BMI:     BP:(!) 88/58  HR:75bpm  TEMP: ( )  RESP:  Physical Exam Vitals and nursing note reviewed.  Constitutional:      General: She is not in acute distress.    Appearance: Normal appearance. She is well-developed. She is obese.  HENT:     Head: Normocephalic and atraumatic.  Eyes:     Conjunctiva/sclera: Conjunctivae normal.     Pupils: Pupils are equal, round, and reactive to light.  Cardiovascular:     Rate and Rhythm: Regular rhythm.  Pulmonary:     Effort: Pulmonary effort is normal. No respiratory distress.  Abdominal:     General: There is no distension.     Palpations: Abdomen is soft.     Tenderness: There is no guarding.  Musculoskeletal:        General: Tenderness present.  Cervical back: Normal range of motion and neck supple.     Right lower leg: No edema.     Left lower leg: No edema.     Comments: Patient ambulating with a cane very slowly and very slow to rise from a seated position with increased pain.  With walking she does have appropriate toe lift on the right without foot drop.  On exam there are some beats of clonus bilaterally but not excessive.  There is some spasticity on the right mild on the Ashworth scale.  Trying to complete strength testing she seems not to be able to give full effort and whether this is a issue of trying to get her foot to move but clearly when she walks there is no foot drop.  Skin:    General: Skin is warm and dry.     Findings: No erythema or rash.  Neurological:     Mental Status: She is alert and oriented to person, place, and time.     Sensory: Sensory deficit present.     Motor: Weakness present. No abnormal muscle tone.     Coordination: Coordination normal.     Gait: Gait abnormal.  Psychiatric:        Mood and Affect: Mood normal.        Behavior: Behavior normal.        Thought Content: Thought content normal.     Ortho Exam  Imaging: No results found.  Past  Medical/Family/Surgical/Social History: Medications & Allergies reviewed per EMR, new medications updated. Patient Active Problem List   Diagnosis Date Noted  . Chronic migraine without aura without status migrainosus, not intractable   . Hypokalemia   . Adjustment reaction with anxiety and depression   . Spinal cord injury, lumbar, without spinal bone injury, sequela ([place]) 08/18/2020  . Paraparesis ([place])   . Post-operative pain   . Hypotension   . Slow transit constipation   . AKI (acute kidney injury) ([place])   . Right leg weakness 08/11/2020  . DDD (degenerative disc disease), lumbar 09/02/2019    Class: Chronic  . Degenerative disc disease, lumbar 09/02/2019  . Chest tightness 09/23/2018  . Bradycardia 09/23/2018  . Labile blood pressure 03/08/2018  . Chronic low back pain 09/03/2017  . History of total hip replacement, left 01/26/2016  . EDEMA 04/28/2008  . LEG PAIN, BILATERAL 12/16/2007  . CERVICAL CANCER 07/02/2007  . MORBID OBESITY 07/02/2007  . DEPRESSION 07/02/2007  . COMMON MIGRAINE 07/02/2007  . ALLERGIC RHINITIS 07/02/2007  . ASTHMA 07/02/2007  . GERD 07/02/2007  . ELEVATED BLOOD PRESSURE WITHOUT DIAGNOSIS OF HYPERTENSION 07/02/2007   Past Medical History:  Diagnosis Date  . Anemia   . Cervical cancer ([place])   . Family history of adverse reaction to anesthesia     " my mother takes a long time time wake up"  . GERD (gastroesophageal reflux disease)   . Headache    migraine  . History of shingles   . Hypertension   . Migraine   . Multiple allergies   . Obesity   . Osteoarthritis    left hip  . PONV (postoperative nausea and vomiting)   . Sleep apnea    does not wear CPAP  . Wears glasses    Family History  Problem Relation Age of Onset  . Renal Disease Mother   . Hypertension Mother   . Sudden Cardiac Death Mother   . Heart failure Mother   . Valvular heart disease Mother   .  Heart disease Mother   . Stroke Brother   . Heart attack Brother 58   . Diabetes Other   . Breast cancer Cousin        paternal side   Past Surgical History:  Procedure Laterality Date  . ABDOMINAL HYSTERECTOMY    . APPENDECTOMY    . BILIOPANCREATIC DIVERSION     with duodenal switch laparoscopic   . CHOLECYSTECTOMY    . DIAGNOSTIC LAPAROSCOPY     LOA  . ESOPHAGOGASTRODUODENOSCOPY (EGD) WITH PROPOFOL N/A 07/25/2017   Procedure: ESOPHAGOGASTRODUODENOSCOPY (EGD) WITH PROPOFOL;  Surgeon: Manya Silvas, MD;  Location: [facility] ENDOSCOPY;  Service: Endoscopy;  Laterality: N/A;  . KNEE ARTHROSCOPY    . LAPAROSCOPIC GASTRIC SLEEVE RESECTION WITH HIATAL HERNIA REPAIR    . TONSILLECTOMY    . TOTAL HIP ARTHROPLASTY Left 01/26/2016   Procedure: LEFT TOTAL HIP ARTHROPLASTY ANTERIOR APPROACH;  Surgeon: Leandrew Koyanagi, MD;  Location: [facility];  Service: Orthopedics;  Laterality: Left;  . TRANSFORAMINAL LUMBAR INTERBODY FUSION (TLIF) WITH PEDICLE SCREW FIXATION 3 LEVEL  08/2019   Basil Dess, MD L2-L5   Social History   Occupational History  . Not on file  Tobacco Use  . Smoking status: Never Smoker  . Smokeless tobacco: Never Used  Vaping Use  . Vaping Use: Never used  Substance and Sexual Activity  . Alcohol use: Not Currently    Comment: occasional wine  . Drug use: No  . Sexual activity: Not Currently

## 2020-10-18 ENCOUNTER — Encounter: Payer: 59 | Attending: Physical Medicine and Rehabilitation | Admitting: Physical Medicine and Rehabilitation

## 2020-10-18 ENCOUNTER — Other Ambulatory Visit: Payer: Self-pay

## 2020-10-18 ENCOUNTER — Encounter: Payer: Self-pay | Admitting: Physical Medicine and Rehabilitation

## 2020-10-18 VITALS — BP 101/67 | HR 73 | Temp 97.6°F | Ht 64.0 in | Wt 235.6 lb

## 2020-10-18 DIAGNOSIS — M79661 Pain in right lower leg: Secondary | ICD-10-CM | POA: Diagnosis not present

## 2020-10-18 DIAGNOSIS — I951 Orthostatic hypotension: Secondary | ICD-10-CM | POA: Diagnosis present

## 2020-10-18 DIAGNOSIS — S34109S Unspecified injury to unspecified level of lumbar spinal cord, sequela: Secondary | ICD-10-CM

## 2020-10-18 DIAGNOSIS — G8222 Paraplegia, incomplete: Secondary | ICD-10-CM

## 2020-10-18 DIAGNOSIS — M792 Neuralgia and neuritis, unspecified: Secondary | ICD-10-CM

## 2020-10-18 MED ORDER — PROMETHAZINE HCL 12.5 MG PO TABS: 12.5000 mg | ORAL_TABLET | Freq: Four times a day (QID) | ORAL | 1 refills | Status: DC | PRN

## 2020-10-18 NOTE — Patient Instructions (Signed)
Pt is a 50 yr old female with paraparesis/incomplete paraplegia due to attempted spinal cord stimulator placement-  With chronic hx of orthostasis, BMI of 41 s/p gastric sleeve, Acute on chronic renal issues, here for f/u.    1. Try Duloxetine 30 mg nightly x 1 week then 60 mg daily nightly- for nerve pain.    Duloxetine /Cymbalta 30 mg nightly x 1 week  Then 60 mg nightly- for nerve pain  1% of patients can have nausea with Duloxetine- call me if needs an anti-nausea medicine. Can also cause mild dry mouth/dry eyes and mild constipation. Takes 1-2 weeks to kick in. FYI  2. On Florinef - now on 0.1Mg  daily (takes at night). Decreased by Cardiology last month.  Since then, BP's have been <329 systolic- sometimes in  SBP in 80s due to since low BP. From SCI AND chronic orthostasis.  Since it's impairing her ability to function/even do therapy, I suggested going back up to 0.2 Mg nightly- to help with function- she is concerned that Dr Einar Gip doesn't want it increased which I completely understand, however if he has other ideas, please let me know- since this isn't working- Unless there's a contraindication, I think Florinef 0.2 mg could be appropriate in HER case.   3. Phenergan/promethazine 12.5 mg q6 hours as needed for nausea- possibly from Duloxetine side effects for 7 days.   4. PT referral for continued therapy- will focus on PT, not OT issues- with no OT referral right now.  Placed due to continued LE weakness.   5. F/U in 6 weeks- call if things aren't working  6. Will wait on lyrica/wait on decreasing Gabapentin.

## 2020-10-18 NOTE — Progress Notes (Signed)
Subjective:    Patient ID: Vanessa Medina, female    DOB: 27-Jun-1971, 50 y.o.   MRN: [id number]  HPI Pt is a 50 yr old female with paraparesis/incomplete paraplegia due to attempted spinal cord stimulator placement-  With chronic hx of orthostasis, BMI of 41 s/p gastric sleeve, Acute on chronic renal issues, here for f/u.   Having bad shakiness- electrical pulsing shocks through them.   Real bad in AM when gets up- with electrical pulses. Started 1 month ago.  As day goes on, starts to calm down.  Worse sitting still, but better with activity.    R foot- burning and numbness in R foot- hurts more when steps on it.  Wakes her up in the middle of the night.    Saw Dr Ernestina Patches the other day- wanted to speak with me prior to changing meds.   Has been on Gabapentin since 2020.  Don't feel like it's helping.    Was started on baclofen- was taking just a night-  10 mg BID right now.   Chose to not see Dr. Davy Pique anymore.   Dr Clayton Lefort- Cardiology- reduced Florinef to 0.1 gm nightly- on 09/10/20- since then,  Can't function some days- stuck in recliner due to dizziness and fog and nausea from low BP- as low in 93Z systolic- esp effecting therapy.    Denies clonus Hurts to put sock and shoes on esp RLE- can't even have sheet touch it.  Cried for 1 hour when stepped on something on floor without shoe on.  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   Pain Inventory Average Pain 7 Pain Right Now 9 My pain is constant, sharp, burning, tingling and aching  In the last 24 hours, has pain interfered with the following? General activity 7 Relation with others 7 Enjoyment of life 7 What TIME of day is your pain at its worst? morning , evening and night Sleep (in general) Good  Pain is worse with: walking, bending, sitting, inactivity, standing and some activites Pain improves with: heat/ice, therapy/exercise and TENS Relief from Meds: fair  Family History  Problem  Relation Age of Onset  . Renal Disease Mother   . Hypertension Mother   . Sudden Cardiac Death Mother   . Heart failure Mother   . Valvular heart disease Mother   . Heart disease Mother   . Stroke Brother   . Heart attack Brother 54  . Diabetes Other   . Breast cancer Cousin        paternal side   Social History   Socioeconomic History  . Marital status: Divorced    Spouse name: Not on file  . Number of children: 1  . Years of education: Not on file  . Highest education level: Not on file  Occupational History  . Not on file  Tobacco Use  . Smoking status: Never Smoker  . Smokeless tobacco: Never Used  Vaping Use  . Vaping Use: Never used  Substance and Sexual Activity  . Alcohol use: Not Currently    Comment: occasional wine  . Drug use: No  . Sexual activity: Not Currently  Other Topics Concern  . Not on file  Social History Narrative  . Not on file   Social Determinants of Health   Financial Resource Strain: Not on file  Food Insecurity: Not on file  Transportation Needs: Not on file  Physical Activity: Not on file  Stress: Not on file  Social Connections:  Not on file   Past Surgical History:  Procedure Laterality Date  . ABDOMINAL HYSTERECTOMY    . APPENDECTOMY    . BILIOPANCREATIC DIVERSION     with duodenal switch laparoscopic   . CHOLECYSTECTOMY    . DIAGNOSTIC LAPAROSCOPY     LOA  . ESOPHAGOGASTRODUODENOSCOPY (EGD) WITH PROPOFOL N/A 07/25/2017   Procedure: ESOPHAGOGASTRODUODENOSCOPY (EGD) WITH PROPOFOL;  Surgeon: Manya Silvas, MD;  Location: [facility] ENDOSCOPY;  Service: Endoscopy;  Laterality: N/A;  . KNEE ARTHROSCOPY    . LAPAROSCOPIC GASTRIC SLEEVE RESECTION WITH HIATAL HERNIA REPAIR    . TONSILLECTOMY    . TOTAL HIP ARTHROPLASTY Left 01/26/2016   Procedure: LEFT TOTAL HIP ARTHROPLASTY ANTERIOR APPROACH;  Surgeon: Leandrew Koyanagi, MD;  Location: [place];  Service: Orthopedics;  Laterality: Left;  . TRANSFORAMINAL LUMBAR INTERBODY FUSION (TLIF) WITH  PEDICLE SCREW FIXATION 3 LEVEL  08/2019   Basil Dess, MD L2-L5   Past Surgical History:  Procedure Laterality Date  . ABDOMINAL HYSTERECTOMY    . APPENDECTOMY    . BILIOPANCREATIC DIVERSION     with duodenal switch laparoscopic   . CHOLECYSTECTOMY    . DIAGNOSTIC LAPAROSCOPY     LOA  . ESOPHAGOGASTRODUODENOSCOPY (EGD) WITH PROPOFOL N/A 07/25/2017   Procedure: ESOPHAGOGASTRODUODENOSCOPY (EGD) WITH PROPOFOL;  Surgeon: Manya Silvas, MD;  Location: [facility] ENDOSCOPY;  Service: Endoscopy;  Laterality: N/A;  . KNEE ARTHROSCOPY    . LAPAROSCOPIC GASTRIC SLEEVE RESECTION WITH HIATAL HERNIA REPAIR    . TONSILLECTOMY    . TOTAL HIP ARTHROPLASTY Left 01/26/2016   Procedure: LEFT TOTAL HIP ARTHROPLASTY ANTERIOR APPROACH;  Surgeon: Leandrew Koyanagi, MD;  Location: [place];  Service: Orthopedics;  Laterality: Left;  . TRANSFORAMINAL LUMBAR INTERBODY FUSION (TLIF) WITH PEDICLE SCREW FIXATION 3 LEVEL  08/2019   Basil Dess, MD L2-L5   Past Medical History:  Diagnosis Date  . Anemia   . Cervical cancer ([place])   . Family history of adverse reaction to anesthesia     " my mother takes a long time time wake up"  . GERD (gastroesophageal reflux disease)   . Headache    migraine  . History of shingles   . Hypertension   . Migraine   . Multiple allergies   . Obesity   . Osteoarthritis    left hip  . PONV (postoperative nausea and vomiting)   . Sleep apnea    does not wear CPAP  . Wears glasses    There were no vitals taken for this visit.  Opioid Risk Score:   Fall Risk Score:  `1  Depression screen PHQ 2/9  Depression screen PHQ 2/9 09/15/2020  Decreased Interest 0  Down, Depressed, Hopeless 0  PHQ - 2 Score 0  Altered sleeping 0  Tired, decreased energy 0  Change in appetite 0  Feeling bad or failure about yourself  0  Trouble concentrating 0  Moving slowly or fidgety/restless 1  Suicidal thoughts 0  PHQ-9 Score 1  Difficult doing work/chores Somewhat difficult  Some recent data  might be hidden   Review of Systems  Musculoskeletal:       Right knee pain throbbs  Neurological: Positive for numbness.       Tingling, burning in right foot  Psychiatric/Behavioral:       Fustration  All other systems reviewed and are negative.      Objective:   Physical Exam  Awake alert, appropriate, NAD Using single point cane Hands shaking- equally bad with intention and  at rest- "comes and goes".    LLE: HF, KE/KF 4/5, DF 4-/5, PF 4/5- very shaky with exam RLE: impaired by pain HF 2+/5- decreased ROM, KE/KF 3-/5, DF and PF 3-/5  Neuro: No clonus in LEs C/o severe nerve pain in RLE      Assessment & Plan:   Pt is a 50 yr old female with paraparesis/incomplete paraplegia due to attempted spinal cord stimulator placement-  With chronic hx of orthostasis, BMI of 41 s/p gastric sleeve, Acute on chronic renal issues, here for f/u.    1. Try Duloxetine 30 mg nightly x 1 week then 60 mg daily nightly- for nerve pain.    Duloxetine /Cymbalta 30 mg nightly x 1 week  Then 60 mg nightly- for nerve pain  1% of patients can have nausea with Duloxetine- call me if needs an anti-nausea medicine. Can also cause mild dry mouth/dry eyes and mild constipation. Takes 1-2 weeks to kick in. FYI  2. On Florinef - now on 0.1Mg  daily (takes at night). Decreased by Cardiology last month.  Since then, BP's have been <638 systolic- sometimes in  SBP in 80s due to since low BP. From SCI AND chronic orthostasis.  Since it's impairing her ability to function/even do therapy, I suggested going back up to 0.2 Mg nightly- to help with function- she is concerned that Dr Einar Gip doesn't want it increased which I completely understand, however if he has other ideas, please let me know- since this isn't working- Unless there's a contraindication, I think Florinef 0.2 mg could be appropriate in HER case.   3. Phenergan/promethazine 12.5 mg q6 hours as needed for nausea- possibly from Duloxetine side  effects for 7 days.   4. PT referral for continued therapy- will focus on PT, not OT issues- with no OT referral right now.  Placed due to continued LE weakness.   5. F/U in 6 weeks- call if things aren't working  6. Will wait on lyrica/wait on decreasing Gabapentin.   I spent a total of 35 minutes at visit- as detailed above.

## 2020-10-19 ENCOUNTER — Telehealth: Payer: Self-pay

## 2020-10-19 ENCOUNTER — Ambulatory Visit: Payer: 59 | Admitting: Occupational Therapy

## 2020-10-19 ENCOUNTER — Ambulatory Visit: Payer: 59

## 2020-10-19 ENCOUNTER — Encounter: Payer: Self-pay | Admitting: Occupational Therapy

## 2020-10-19 DIAGNOSIS — R278 Other lack of coordination: Secondary | ICD-10-CM

## 2020-10-19 DIAGNOSIS — M6281 Muscle weakness (generalized): Secondary | ICD-10-CM

## 2020-10-19 DIAGNOSIS — R2681 Unsteadiness on feet: Secondary | ICD-10-CM

## 2020-10-19 DIAGNOSIS — R2689 Other abnormalities of gait and mobility: Secondary | ICD-10-CM

## 2020-10-19 MED ORDER — DULOXETINE HCL 30 MG PO CPEP: 30.0000 mg | ORAL_CAPSULE | Freq: Every day | ORAL | 5 refills | Status: DC

## 2020-10-19 NOTE — Therapy (Signed)
[place] MAIN [facility] SERVICES [address] [place], [place], [address] Phone: [phone]   Fax:  [phone]  Occupational Therapy Treatment/Discharge Note  Patient Details  Name: Vanessa Medina MRN: [id number] Date of Birth: 04/16/1971 No data recorded  Encounter Date: 10/19/2020   OT End of Session - 10/19/20 0853    Visit Number 11    Number of Visits 24    Date for OT Re-Evaluation 11/29/20    Authorization Type Progress report period starting 10/14/2020    OT Start Time 0845    OT Stop Time 0930    OT Time Calculation (min) 45 min    Activity Tolerance Patient tolerated treatment well    Behavior During Therapy [facility] for tasks assessed/performed           Past Medical History:  Diagnosis Date  . Anemia   . Cervical cancer ([place])   . Family history of adverse reaction to anesthesia     " my mother takes a long time time wake up"  . GERD (gastroesophageal reflux disease)   . Headache    migraine  . History of shingles   . Hypertension   . Migraine   . Multiple allergies   . Obesity   . Osteoarthritis    left hip  . PONV (postoperative nausea and vomiting)   . Sleep apnea    does not wear CPAP  . Wears glasses     Past Surgical History:  Procedure Laterality Date  . ABDOMINAL HYSTERECTOMY    . APPENDECTOMY    . BILIOPANCREATIC DIVERSION     with duodenal switch laparoscopic   . CHOLECYSTECTOMY    . DIAGNOSTIC LAPAROSCOPY     LOA  . ESOPHAGOGASTRODUODENOSCOPY (EGD) WITH PROPOFOL N/A 07/25/2017   Procedure: ESOPHAGOGASTRODUODENOSCOPY (EGD) WITH PROPOFOL;  Surgeon: Manya Silvas, MD;  Location: [facility] ENDOSCOPY;  Service: Endoscopy;  Laterality: N/A;  . KNEE ARTHROSCOPY    . LAPAROSCOPIC GASTRIC SLEEVE RESECTION WITH HIATAL HERNIA REPAIR    . TONSILLECTOMY    . TOTAL HIP ARTHROPLASTY Left 01/26/2016   Procedure: LEFT TOTAL HIP ARTHROPLASTY ANTERIOR APPROACH;  Surgeon: Leandrew Koyanagi, MD;  Location: [place];  Service:  Orthopedics;  Laterality: Left;  . TRANSFORAMINAL LUMBAR INTERBODY FUSION (TLIF) WITH PEDICLE SCREW FIXATION 3 LEVEL  08/2019   Basil Dess, MD L2-L5    There were no vitals filed for this visit.   Subjective Assessment - 10/19/20 0852    Subjective  Pt. reports that she had a difficult day at her appointments yesterday.    Patient is accompanied by: Family member    Pertinent History Pt. is a 50 y.o. female who had a Spinal cord stimular trial placement in November 2021. Pt. was admitted for Spinal cord stimulator placement on 08/11/2020. Pt. sustained a spinal cord Injury T12-L1, CVA. Pt. received inpatient rehab services.    Currently in Pain? Yes    Pain Score 7     Pain Location Back    Pain Orientation Right    Pain Descriptors / Indicators Aching;Throbbing    Pain Type Chronic pain              [facility] OT Assessment - 10/19/20 0856      Coordination   Right 9 Hole Peg Test 25    Left 9 Hole Peg Test 32      Hand Function   Right Hand Grip (lbs) 11    Right Hand Lateral Pinch 4 lbs  Right Hand 3 Point Pinch 3 lbs    Left Hand Grip (lbs) 14    Left Hand Lateral Pinch 4 lbs    Left 3 point pinch 4 lbs          Pt. had multiple physician appointments over this past week. Pt. Saw Dr. Dagoberto Ligas yesterday. Pt.'s has had several mediation changes. Per MD, pt. with a new Referral to continue PT, and focus on PT issues, not OT issues right now. MD indicated no new referral for OT services. Measurements were obtained, and goals were reviewed. FOTO score: 44.6 Pt. Improved bilateral grip strength, pinch strength, and [place] skills this visit.  Pt. Continues to have difficulty with standing to complete IADL home management tasks, and meal preparation. Pt. Continues to have difficulty using scissors to cut fabric, and typing efficiently.  Pt. education was provided about a HEP for hand function, and [facility] skills. OT skilled services are discharge at this this time.                    OT Education - 10/19/20 0853    Education Details [place] skills    Person(s) Educated Patient    Methods Explanation    Comprehension Verbalized understanding               OT Long Term Goals - 10/19/20 0905      OT LONG TERM GOAL #1   Title Pt. will improve FOTO score by 2 points to improve Focus on functional therapeutic outcomes  for FOTO related daily tasks.    Baseline 10/18/2020: 44.6  Eval: FOTO score 54,    Time 12    Period Weeks    Status Not Met    Target Date 11/29/20      OT LONG TERM GOAL #2   Title Pt. will independently, and efficiently type email correspondence in preparation for work repated typing tasks.    Baseline 10/19/2020: Pt. continues to have difficulty typing  accurately, and efficiently    Time 12    Period Weeks    Status Not Met    Target Date 11/29/20      OT LONG TERM GOAL #3   Title Pt. will independently use scissors to accurately cut fabric for crafting projects.    Baseline 10/19/2020: Pt. continues to have difficulty using scissors for  applying pressure when cutting  fabric straight.    Time 12    Period Weeks    Status On-going    Target Date 11/29/20      OT LONG TERM GOAL #4   Title Pt. will improve activity tolerance to be able to complete light home making tasks with modified independence.    Baseline 10/19/2020: Pt. continues to have difficulty standing to wash dishes.    Time 12    Period Weeks    Status Not Met    Target Date 11/29/20      OT LONG TERM GOAL #5   Title Pt. will perform light meal preparation efficiently with modifed independence.    Baseline 10/14/2020:Pt. is able to prepare a light snack, however has difficultystanding to prepare a meal.    Time 12    Period Weeks    Status On-going    Target Date 11/29/20                  Patient will benefit from skilled therapeutic intervention in order to improve the following deficits and impairments:  Visit  Diagnosis: Muscle weakness (generalized)    Problem List Patient Active Problem List   Diagnosis Date Noted  . Nerve pain 10/18/2020  . Incomplete paraplegia ([employer]) 10/18/2020  . Orthostatic hypotension 10/18/2020  . Chronic migraine without aura without status migrainosus, not intractable   . Hypokalemia   . Adjustment reaction with anxiety and depression   . Spinal cord injury, lumbar, without spinal bone injury, sequela ([place]) 08/18/2020  . Paraparesis ([place])   . Post-operative pain   . Hypotension   . Slow transit constipation   . AKI (acute kidney injury) ([place])   . Right leg weakness 08/11/2020  . DDD (degenerative disc disease), lumbar 09/02/2019    Class: Chronic  . Degenerative disc disease, lumbar 09/02/2019  . Chest tightness 09/23/2018  . Bradycardia 09/23/2018  . Labile blood pressure 03/08/2018  . Chronic low back pain 09/03/2017  . History of total hip replacement, left 01/26/2016  . EDEMA 04/28/2008  . LEG PAIN, BILATERAL 12/16/2007  . CERVICAL CANCER 07/02/2007  . MORBID OBESITY 07/02/2007  . DEPRESSION 07/02/2007  . COMMON MIGRAINE 07/02/2007  . ALLERGIC RHINITIS 07/02/2007  . ASTHMA 07/02/2007  . GERD 07/02/2007  . ELEVATED BLOOD PRESSURE WITHOUT DIAGNOSIS OF HYPERTENSION 07/02/2007    Harrel Carina, MS, OTR/L 10/19/2020, 9:38 AM  [place] MAIN [facility] SERVICES [address] [place], [place], [address] Phone: [phone]   Fax:  [phone]  Name: Vanessa Medina MRN: [id number] Date of Birth: 09/08/1970

## 2020-10-19 NOTE — Telephone Encounter (Signed)
Approved for Durolane, right knee. Buy & Bill Must meet deductible first Patient will be responsible for 10% OOP. $50.00 Co-pay No PA required  Appt. 10/22/2020 with Dr. Erlinda Hong

## 2020-10-19 NOTE — Therapy (Signed)
[place] MAIN [facility] SERVICES [place], [place], [address] Phone: [phone]   Fax:  [phone]  Physical Therapy Treatment  Patient Details  Name: Vanessa Medina MRN: [id number] Date of Birth: [birth year] Referring Provider (PT): Dr. Erling Cruz   Encounter Date: 10/19/2020   PT End of Session - 10/19/20 0855    Visit Number 11    Number of Visits 17    Date for PT Re-Evaluation 10/27/20    Authorization Type eval: 12/29    PT Start Time 0930    PT Stop Time 1014    PT Time Calculation (min) 44 min    Equipment Utilized During Treatment Gait belt    Activity Tolerance Patient tolerated treatment well;Patient limited by pain    Behavior During Therapy [facility] for tasks assessed/performed           Past Medical History:  Diagnosis Date  . Anemia   . Cervical cancer ([place])   . Family history of adverse reaction to anesthesia     " my mother takes a long time time wake up"  . GERD (gastroesophageal reflux disease)   . Headache    migraine  . History of shingles   . Hypertension   . Migraine   . Multiple allergies   . Obesity   . Osteoarthritis    left hip  . PONV (postoperative nausea and vomiting)   . Sleep apnea    does not wear CPAP  . Wears glasses     Past Surgical History:  Procedure Laterality Date  . ABDOMINAL HYSTERECTOMY    . APPENDECTOMY    . BILIOPANCREATIC DIVERSION     with duodenal switch laparoscopic   . CHOLECYSTECTOMY    . DIAGNOSTIC LAPAROSCOPY     LOA  . ESOPHAGOGASTRODUODENOSCOPY (EGD) WITH PROPOFOL N/A 07/25/2017   Procedure: ESOPHAGOGASTRODUODENOSCOPY (EGD) WITH PROPOFOL;  Surgeon: Manya Silvas, MD;  Location: [facility] ENDOSCOPY;  Service: Endoscopy;  Laterality: N/A;  . KNEE ARTHROSCOPY    . LAPAROSCOPIC GASTRIC SLEEVE RESECTION WITH HIATAL HERNIA REPAIR    . TONSILLECTOMY    . TOTAL HIP ARTHROPLASTY Left 01/26/2016   Procedure: LEFT TOTAL HIP ARTHROPLASTY ANTERIOR APPROACH;  Surgeon:  Leandrew Koyanagi, MD;  Location: [place];  Service: Orthopedics;  Laterality: Left;  . TRANSFORAMINAL LUMBAR INTERBODY FUSION (TLIF) WITH PEDICLE SCREW FIXATION 3 LEVEL  08/2019   Basil Dess, MD L2-L5    There were no vitals filed for this visit.   Subjective Assessment - 10/19/20 1125    Subjective Pt reports she saw her physician and that they are planning to change her medications back to previous regimen. Pt reports she will be picking up new medication for her nerve pain. She says her nerve pain was really bad over the weekend. Rates 8/10 today.    Pertinent History 50 year old female with history of HTN, migraines, morbid obesity--BMI-41, chronic hypotension, multiple back surgeries with chronic pain with LLE lumbar radiculopathy who was scheduled to have spinal cord stimulator placed by Dr. Davy Pique on 08/11/2020 but unable to perform surgery due to scar tissue.  History taken from patient and chart review.  Post op on awakening, she had excruciating pain RLE with plegia and and allodynia. She was admitted for work up by Dr. Ronnald Ramp from surgical center on 08/11/2020. Thoracic MRI done revealing subtle increased T2/STIR intensity distal cord at T12-L1 level suspicious for edema/acute spinal cord injury, no CSF leak as well as prior PLIF L2-L5  with residual foraminal protrusion L3/5 potentially irritating right L3 nerve. She was started on IV Dilaudid, gabapentin as well as IV Decadron for management of pain, headaches and severe neuropathy.  She continues to have pain in RLE but is having some motor return, has constant headache, decreased hearing in right> left ear, constipation with nausea as well as epigastric pain and weakness. Therapy ongoing and CIR recommended due to functional decline.  Of note, she reports 50 lbs weigh loss in the past 3-4 months--due to issues with N/V/intake. Has had two falls last month---no recall of incident leading to fall question due to syncope. Was in process of work up for  renal disease. She has had issue with LLE weakness with pain for the past year and being followed by Dr. Ernestina Patches. Did well with stimulator trial.    How long can you sit comfortably? 1 hour    How long can you stand comfortably? 8 mins    How long can you walk comfortably? 20-30 minutes    Currently in Pain? Yes    Pain Score 8     Pain Location Back    Pain Onset More than a month ago   pt with chronic back pain from over a month ago but with acute pain as well due to recent fall at home         Treatment:  Nustep level 2 x 5 minutes  Therapeutic Exercise:  Sit<>stands 3x6 - pt rates "medium;"  Prolonged rest break after third set  Standing heel raises 3x10  Seated dorsiflexion with YTB - 2x20  Seated LAQ with 2.5# on LLE; no weight on RLE - 1x12 B LES; hands-on assist from PT with RLE; removed R AW due to reports of knee pain. 1x10 BLEs 2.5# AW on LLE no weight on RLE  Assessment: Pt improved tolerance for activity today, with ability to perform standing therex. Pt also with improved activation of R dorsiflexors with increased AROM compared to previous session. R knee pain does limit patient's ability to perform LAQ with AW, so exercise modified to without AW where pt reported decreased knee pain. Pt will benefit from further skilled therapy to improve BLE strength, endurance, balance and functional mobility to improve QOL.   PT Education - 10/19/20 1127    Education Details Pt educated on technique with STS, bodymechanics    Person(s) Educated Patient    Methods Explanation;Verbal cues    Comprehension Verbalized understanding;Returned demonstration            PT Short Term Goals - 10/14/20 1649      PT SHORT TERM GOAL #1   Title Pt will be independent with HEP in order to improve strength and balance in order to decrease fall risk and improve function at home and work.    Baseline 10/14/2020 on-going/deferred    Time 6    Period Weeks    Status On-going    Target  Date 10/13/20             PT Long Term Goals - 10/14/20 1650      PT LONG TERM GOAL #1   Title Patient (< 49 years old) will complete five times sit to stand test in < 10 seconds indicating an increased LE strength and improved balance.    Baseline 12/29: 38.45s; 10/14/2020 42.8 seconds    Time 12    Period Weeks    Status On-going    Target Date 11/24/20      PT  LONG TERM GOAL #2   Title Pt will decrease TUG to below 14 seconds/decrease in order to demonstrate decreased fall risk.    Baseline 12/29: 32.94s; 10/14/2020 43.12 sec    Time 12    Period Weeks    Status On-going    Target Date 11/24/20      PT LONG TERM GOAL #3   Title Pt will decrease DHI score by at least 18 points in order to demonstrate clinically significant reduction in disability    Baseline 12/29: 30; 10/14/2020 62, indicating severe handicap    Time 12    Period Weeks    Status New    Target Date 11/24/20      PT LONG TERM GOAL #4   Title Patient will increase 10 meter walk test to >1.21m/s as to improve gait speed for better community ambulation and to reduce fall risk.    Baseline 12/29: 0.37 m/s; 0.38 m/s    Time 12    Period Weeks    Status On-going    Target Date 11/24/20      PT LONG TERM GOAL #5   Title Patient will increase FOTO score to equal to or greater than 64 to demonstrate statistically significant improvement in mobility and quality of life.    Baseline 12/29: 52; 10/14/2020 45    Time 12    Period Weeks    Status On-going    Target Date 11/24/20                 Plan - 10/19/20 1134    Clinical Impression Statement Pt improved tolerance for activity today, with ability to perform standing therex. Pt also with improved activation of R dorsiflexors with increased AROM compared to previous session. R knee pain does limit patient's ability to perform LAQ with AW, so exercise modified to without AW where pt reported decreased knee pain. Pt will benefit from further skilled therapy  to improve BLE strength, endurance, balance and functional mobility to improve QOL.    Personal Factors and Comorbidities Comorbidity 3+;Time since onset of injury/illness/exacerbation    Comorbidities HYPOtension, paraparesis, SCI without spinal bone injury, DDD, cervical cancer, acute kidney injury, GERD, asthma, bradycardia, L total hip replacement, hypokalemia    Examination-Activity Limitations Squat;Lift;Stairs;Bend;Stand;Reach Overhead;Carry;Transfers    Examination-Participation Restrictions Cleaning;Laundry;Meal Prep;Community Activity;Driving;Occupation    Stability/Clinical Decision Making Evolving/Moderate complexity    Rehab Potential Fair    PT Frequency 2x / week    PT Duration 12 weeks    PT Treatment/Interventions ADLs/Self Care Home Management;Aquatic Therapy;Gait training;Stair training;Functional mobility training;Therapeutic activities;Therapeutic exercise;Balance training;Moist Heat;Manual techniques;Patient/family education;Neuromuscular re-education;Passive range of motion;Energy conservation;Vestibular;Joint Manipulations;Spinal Manipulations    PT Next Visit Plan Instruction in pain modulation techniques, progress balance interventions, ambulate through clinic; progress standing therex    PT Home Exercise Plan PROGRESS note: add new HEP, initiate UBE bike for cardiovasc endurance, R ankle motor control/strength, focus on STS and ambulation    Consulted and Agree with Plan of Care Patient           Patient will benefit from skilled therapeutic intervention in order to improve the following deficits and impairments:  Abnormal gait,Dizziness,Improper body mechanics,Decreased mobility,Cardiopulmonary status limiting activity,Decreased activity tolerance,Decreased endurance,Decreased range of motion,Decreased strength,Decreased balance,Decreased safety awareness,Difficulty walking,Impaired flexibility,Obesity  Visit Diagnosis: Muscle weakness (generalized)  Other lack of  coordination  Unsteadiness on feet  Other abnormalities of gait and mobility     Problem List Patient Active Problem List   Diagnosis Date Noted  . Nerve  pain 10/18/2020  . Incomplete paraplegia ([place]) 10/18/2020  . Orthostatic hypotension 10/18/2020  . Chronic migraine without aura without status migrainosus, not intractable   . Hypokalemia   . Adjustment reaction with anxiety and depression   . Spinal cord injury, lumbar, without spinal bone injury, sequela ([place]) 08/18/2020  . Paraparesis ([place])   . Post-operative pain   . Hypotension   . Slow transit constipation   . AKI (acute kidney injury) ([place])   . Right leg weakness 08/11/2020  . DDD (degenerative disc disease), lumbar 09/02/2019    Class: Chronic  . Degenerative disc disease, lumbar 09/02/2019  . Chest tightness 09/23/2018  . Bradycardia 09/23/2018  . Labile blood pressure 03/08/2018  . Chronic low back pain 09/03/2017  . History of total hip replacement, left 01/26/2016  . EDEMA 04/28/2008  . LEG PAIN, BILATERAL 12/16/2007  . CERVICAL CANCER 07/02/2007  . MORBID OBESITY 07/02/2007  . DEPRESSION 07/02/2007  . COMMON MIGRAINE 07/02/2007  . ALLERGIC RHINITIS 07/02/2007  . ASTHMA 07/02/2007  . GERD 07/02/2007  . ELEVATED BLOOD PRESSURE WITHOUT DIAGNOSIS OF HYPERTENSION 07/02/2007   Ricard Dillon PT, DPT 10/19/2020, 11:36 AM  [place] MAIN [facility] SERVICES [address] [place], [place], [address] Phone: [phone]   Fax:  [phone]  Name: CALIA NAPP MRN: [id number] Date of Birth: [birth year]

## 2020-10-21 ENCOUNTER — Ambulatory Visit: Payer: 59 | Admitting: Occupational Therapy

## 2020-10-21 ENCOUNTER — Other Ambulatory Visit: Payer: Self-pay

## 2020-10-21 ENCOUNTER — Ambulatory Visit: Payer: 59

## 2020-10-21 DIAGNOSIS — R2689 Other abnormalities of gait and mobility: Secondary | ICD-10-CM

## 2020-10-21 DIAGNOSIS — M6281 Muscle weakness (generalized): Secondary | ICD-10-CM

## 2020-10-21 DIAGNOSIS — R2681 Unsteadiness on feet: Secondary | ICD-10-CM

## 2020-10-21 DIAGNOSIS — R278 Other lack of coordination: Secondary | ICD-10-CM

## 2020-10-21 NOTE — Therapy (Signed)
[place] MAIN [facility] SERVICES [address] [place], [place], [address] Phone: [phone]   Fax:  [phone]  Physical Therapy Treatment  Patient Details  Name: Vanessa Medina MRN: [id number] Date of Birth: 12/01/1970 Referring Provider (PT): Dr. Erling Cruz   Encounter Date: 10/21/2020   PT End of Session - 10/21/20 1607    Visit Number 12    Number of Visits 17    Date for PT Re-Evaluation 10/27/20    Authorization Type eval: 12/29    PT Start Time 0932    PT Stop Time 1016    PT Time Calculation (min) 44 min    Equipment Utilized During Treatment Gait belt    Activity Tolerance Patient tolerated treatment well    Behavior During Therapy [facility] for tasks assessed/performed           Past Medical History:  Diagnosis Date  . Anemia   . Cervical cancer ([place])   . Family history of adverse reaction to anesthesia     " my mother takes a long time time wake up"  . GERD (gastroesophageal reflux disease)   . Headache    migraine  . History of shingles   . Hypertension   . Migraine   . Multiple allergies   . Obesity   . Osteoarthritis    left hip  . PONV (postoperative nausea and vomiting)   . Sleep apnea    does not wear CPAP  . Wears glasses     Past Surgical History:  Procedure Laterality Date  . ABDOMINAL HYSTERECTOMY    . APPENDECTOMY    . BILIOPANCREATIC DIVERSION     with duodenal switch laparoscopic   . CHOLECYSTECTOMY    . DIAGNOSTIC LAPAROSCOPY     LOA  . ESOPHAGOGASTRODUODENOSCOPY (EGD) WITH PROPOFOL N/A 07/25/2017   Procedure: ESOPHAGOGASTRODUODENOSCOPY (EGD) WITH PROPOFOL;  Surgeon: Manya Silvas, MD;  Location: [facility] ENDOSCOPY;  Service: Endoscopy;  Laterality: N/A;  . KNEE ARTHROSCOPY    . LAPAROSCOPIC GASTRIC SLEEVE RESECTION WITH HIATAL HERNIA REPAIR    . TONSILLECTOMY    . TOTAL HIP ARTHROPLASTY Left 01/26/2016   Procedure: LEFT TOTAL HIP ARTHROPLASTY ANTERIOR APPROACH;  Surgeon: Leandrew Koyanagi, MD;   Location: [place];  Service: Orthopedics;  Laterality: Left;  . TRANSFORAMINAL LUMBAR INTERBODY FUSION (TLIF) WITH PEDICLE SCREW FIXATION 3 LEVEL  08/2019   Basil Dess, MD L2-L5    There were no vitals filed for this visit.   Subjective Assessment - 10/21/20 0940    Subjective Pt reports she was very nauseated last night with bad nerve pain. Pt reports no pain currently. Pt is having "gel injection" to R knee tomorrow.    Pertinent History 50 year old female with history of HTN, migraines, morbid obesity--BMI-41, chronic hypotension, multiple back surgeries with chronic pain with LLE lumbar radiculopathy who was scheduled to have spinal cord stimulator placed by Dr. Davy Pique on 08/11/2020 but unable to perform surgery due to scar tissue.  History taken from patient and chart review.  Post op on awakening, she had excruciating pain RLE with plegia and and allodynia. She was admitted for work up by Dr. Ronnald Ramp from surgical center on 08/11/2020. Thoracic MRI done revealing subtle increased T2/STIR intensity distal cord at T12-L1 level suspicious for edema/acute spinal cord injury, no CSF leak as well as prior PLIF L2-L5 with residual foraminal protrusion L3/5 potentially irritating right L3 nerve. She was started on IV Dilaudid, gabapentin as well as IV Decadron for  management of pain, headaches and severe neuropathy.  She continues to have pain in RLE but is having some motor return, has constant headache, decreased hearing in right> left ear, constipation with nausea as well as epigastric pain and weakness. Therapy ongoing and CIR recommended due to functional decline.  Of note, she reports 50 lbs weigh loss in the past 3-4 months--due to issues with N/V/intake. Has had two falls last month---no recall of incident leading to fall question due to syncope. Was in process of work up for renal disease. She has had issue with LLE weakness with pain for the past year and being followed by Dr. Ernestina Patches. Did well with  stimulator trial.    How long can you sit comfortably? 1 hour    How long can you stand comfortably? 8 mins    How long can you walk comfortably? 20-30 minutes    Currently in Pain? No/denies    Pain Onset More than a month ago   pt with chronic back pain from over a month ago but with acute pain as well due to recent fall at home          Treatment:  Therapeutic Exercise:   Nustep level 2, x 10 minutes HR 78  Increased to level 3 at 4 min  VC to increase SPM to >45; pt rates level 3 as "medium" At end HR 72    Sit<>stands 3x5, BUE assist to complete reps, CGA  Baps board dorsiflexion/plantarflexion 3x15 reps; VC/TC provided to increase ROM. Pt demonstrates decreased coordination/lack of smooth movement when transitioning between PF and DF.  Gait: Ambulation with SPC around clinic 6x. VC/demonstration for increased step-length and heel strike BLEs. Close CGA provided. Pt with significant decrease in gait speed when attempt increased step length.   Assessment: Pt able to tolerate upright activity and gait today without sx of dizziness or light-headedness, demonstrating improvement from previous sessions. Pt still with difficulty with R LE dorsiflexion/plantarflexion with decreased coordination between movements. Pt will benefit from further skilled therapy to improve BLE strength, gait and endurance to improve QOL.   PT Education - 10/21/20 1606    Education Details Pt educated on safe technique with SPC    Person(s) Educated Patient    Methods Explanation;Verbal cues    Comprehension Verbalized understanding;Returned demonstration            PT Short Term Goals - 10/14/20 1649      PT SHORT TERM GOAL #1   Title Pt will be independent with HEP in order to improve strength and balance in order to decrease fall risk and improve function at home and work.    Baseline 10/14/2020 on-going/deferred    Time 6    Period Weeks    Status On-going    Target Date 10/13/20              PT Long Term Goals - 10/14/20 1650      PT LONG TERM GOAL #1   Title Patient (< 6 years old) will complete five times sit to stand test in < 10 seconds indicating an increased LE strength and improved balance.    Baseline 12/29: 38.45s; 10/14/2020 42.8 seconds    Time 12    Period Weeks    Status On-going    Target Date 11/24/20      PT LONG TERM GOAL #2   Title Pt will decrease TUG to below 14 seconds/decrease in order to demonstrate decreased fall risk.    Baseline  12/29: 32.94s; 10/14/2020 43.12 sec    Time 12    Period Weeks    Status On-going    Target Date 11/24/20      PT LONG TERM GOAL #3   Title Pt will decrease DHI score by at least 18 points in order to demonstrate clinically significant reduction in disability    Baseline 12/29: 30; 10/14/2020 62, indicating severe handicap    Time 12    Period Weeks    Status New    Target Date 11/24/20      PT LONG TERM GOAL #4   Title Patient will increase 10 meter walk test to >1.31m/s as to improve gait speed for better community ambulation and to reduce fall risk.    Baseline 12/29: 0.37 m/s; 0.38 m/s    Time 12    Period Weeks    Status On-going    Target Date 11/24/20      PT LONG TERM GOAL #5   Title Patient will increase FOTO score to equal to or greater than 64 to demonstrate statistically significant improvement in mobility and quality of life.    Baseline 12/29: 52; 10/14/2020 45    Time 12    Period Weeks    Status On-going    Target Date 11/24/20                 Plan - 10/21/20 1613    Clinical Impression Statement Pt able to tolerate upright activity and gait today without sx of dizziness or light-headedness, demonstrating improvement from previous sessions. Pt still with difficulty with R LE dorsiflexion/plantarflexion with decreased coordination between movements. Pt will benefit from further skilled therapy to improve BLE strength, gait and endurance to improve QOL.    Personal Factors and  Comorbidities Comorbidity 3+;Time since onset of injury/illness/exacerbation    Comorbidities HYPOtension, paraparesis, SCI without spinal bone injury, DDD, cervical cancer, acute kidney injury, GERD, asthma, bradycardia, L total hip replacement, hypokalemia    Examination-Activity Limitations Squat;Lift;Stairs;Bend;Stand;Reach Overhead;Carry;Transfers    Examination-Participation Restrictions Cleaning;Laundry;Meal Prep;Community Activity;Driving;Occupation    Stability/Clinical Decision Making Evolving/Moderate complexity    Rehab Potential Fair    PT Frequency 2x / week    PT Duration 12 weeks    PT Treatment/Interventions ADLs/Self Care Home Management;Aquatic Therapy;Gait training;Stair training;Functional mobility training;Therapeutic activities;Therapeutic exercise;Balance training;Moist Heat;Manual techniques;Patient/family education;Neuromuscular re-education;Passive range of motion;Energy conservation;Vestibular;Joint Manipulations;Spinal Manipulations    PT Next Visit Plan Instruction in pain modulation techniques, progress balance interventions, ambulate through clinic; progress gait/endurance    PT Home Exercise Plan PROGRESS note: add new HEP, initiate UBE bike for cardiovasc endurance, R ankle motor control/strength, focus on STS and ambulation    Consulted and Agree with Plan of Care Patient           Patient will benefit from skilled therapeutic intervention in order to improve the following deficits and impairments:  Abnormal gait,Dizziness,Improper body mechanics,Decreased mobility,Cardiopulmonary status limiting activity,Decreased activity tolerance,Decreased endurance,Decreased range of motion,Decreased strength,Decreased balance,Decreased safety awareness,Difficulty walking,Impaired flexibility,Obesity  Visit Diagnosis: Muscle weakness (generalized)  Unsteadiness on feet  Other abnormalities of gait and mobility  Other lack of coordination     Problem List Patient  Active Problem List   Diagnosis Date Noted  . Nerve pain 10/18/2020  . Incomplete paraplegia ([place]) 10/18/2020  . Orthostatic hypotension 10/18/2020  . Chronic migraine without aura without status migrainosus, not intractable   . Hypokalemia   . Adjustment reaction with anxiety and depression   . Spinal cord injury, lumbar, without spinal bone  injury, sequela ([place]) 08/18/2020  . Paraparesis ([place])   . Post-operative pain   . Hypotension   . Slow transit constipation   . AKI (acute kidney injury) ([place])   . Right leg weakness 08/11/2020  . DDD (degenerative disc disease), lumbar 09/02/2019    Class: Chronic  . Degenerative disc disease, lumbar 09/02/2019  . Chest tightness 09/23/2018  . Bradycardia 09/23/2018  . Labile blood pressure 03/08/2018  . Chronic low back pain 09/03/2017  . History of total hip replacement, left 01/26/2016  . EDEMA 04/28/2008  . LEG PAIN, BILATERAL 12/16/2007  . CERVICAL CANCER 07/02/2007  . MORBID OBESITY 07/02/2007  . DEPRESSION 07/02/2007  . COMMON MIGRAINE 07/02/2007  . ALLERGIC RHINITIS 07/02/2007  . ASTHMA 07/02/2007  . GERD 07/02/2007  . ELEVATED BLOOD PRESSURE WITHOUT DIAGNOSIS OF HYPERTENSION 07/02/2007   Ricard Dillon PT, DPT 10/21/2020, 4:18 PM  [place] MAIN [facility] SERVICES [address] [place], [place], [address] Phone: [phone]   Fax:  [phone]  Name: Vanessa Medina MRN: [id number] Date of Birth: [birth year]

## 2020-10-22 ENCOUNTER — Other Ambulatory Visit: Payer: Self-pay

## 2020-10-22 ENCOUNTER — Ambulatory Visit: Payer: 59 | Admitting: Orthopaedic Surgery

## 2020-10-22 DIAGNOSIS — M1711 Unilateral primary osteoarthritis, right knee: Secondary | ICD-10-CM | POA: Diagnosis not present

## 2020-10-22 NOTE — Progress Notes (Signed)
Office Visit Note   Patient: Vanessa Medina           Date of Birth: Jul 26, 1971           MRN: [id number] Visit Date: 10/22/2020              Requested by: Maryland Pink, MD [address] [place],  [place] [address] PCP: Maryland Pink, MD   Assessment & Plan: Visit Diagnoses:  1. Unilateral primary osteoarthritis, right knee     Plan: Impression is right knee arthritis.  We proceeded with the first visco injection today.  She will follow up with [name] as needed.  Follow-Up Instructions: Return if symptoms worsen or fail to improve.   Orders:  Orders Placed This Encounter  Procedures  . Large Joint Inj: R knee   No orders of the defined types were placed in this encounter.     Procedures: Large Joint Inj: R knee on 10/22/2020 9:43 AM Indications: pain Details: 22 G needle, anterolateral approach Medications: 2 mL lidocaine 1 %; 2 mL bupivacaine 0.25 %; 60 mg Sodium Hyaluronate 60 MG/3ML      Clinical Data: No additional findings.   Subjective: Chief Complaint  Patient presents with  . Right Knee - Pain    HPI patient is here for her first Durolane injection to the right knee.  She did not have fantastic relief from the cortisone injection.  She has not previously had a viscosupplementation injection.     Objective: Vital Signs: There were no vitals taken for this visit.    Ortho Exam stable right knee exam  Specialty Comments:  No specialty comments available.  Imaging: No new imaging   PMFS History: Patient Active Problem List   Diagnosis Date Noted  . Nerve pain 10/18/2020  . Incomplete paraplegia ([place]) 10/18/2020  . Orthostatic hypotension 10/18/2020  . Chronic migraine without aura without status migrainosus, not intractable   . Hypokalemia   . Adjustment reaction with anxiety and depression   . Spinal cord injury, lumbar, without spinal bone injury, sequela ([place]) 08/18/2020  . Paraparesis ([place])   . Post-operative  pain   . Hypotension   . Slow transit constipation   . AKI (acute kidney injury) ([place])   . Right leg weakness 08/11/2020  . DDD (degenerative disc disease), lumbar 09/02/2019    Class: Chronic  . Degenerative disc disease, lumbar 09/02/2019  . Chest tightness 09/23/2018  . Bradycardia 09/23/2018  . Labile blood pressure 03/08/2018  . Chronic low back pain 09/03/2017  . History of total hip replacement, left 01/26/2016  . EDEMA 04/28/2008  . LEG PAIN, BILATERAL 12/16/2007  . CERVICAL CANCER 07/02/2007  . MORBID OBESITY 07/02/2007  . DEPRESSION 07/02/2007  . COMMON MIGRAINE 07/02/2007  . ALLERGIC RHINITIS 07/02/2007  . ASTHMA 07/02/2007  . GERD 07/02/2007  . ELEVATED BLOOD PRESSURE WITHOUT DIAGNOSIS OF HYPERTENSION 07/02/2007   Past Medical History:  Diagnosis Date  . Anemia   . Cervical cancer ([place])   . Family history of adverse reaction to anesthesia     " my mother takes a long time time wake up"  . GERD (gastroesophageal reflux disease)   . Headache    migraine  . History of shingles   . Hypertension   . Migraine   . Multiple allergies   . Obesity   . Osteoarthritis    left hip  . PONV (postoperative nausea and vomiting)   . Sleep apnea    does not  wear CPAP  . Wears glasses     Family History  Problem Relation Age of Onset  . Renal Disease Mother   . Hypertension Mother   . Sudden Cardiac Death Mother   . Heart failure Mother   . Valvular heart disease Mother   . Heart disease Mother   . Stroke Brother   . Heart attack Brother 100  . Diabetes Other   . Breast cancer Cousin        paternal side    Past Surgical History:  Procedure Laterality Date  . ABDOMINAL HYSTERECTOMY    . APPENDECTOMY    . BILIOPANCREATIC DIVERSION     with duodenal switch laparoscopic   . CHOLECYSTECTOMY    . DIAGNOSTIC LAPAROSCOPY     LOA  . ESOPHAGOGASTRODUODENOSCOPY (EGD) WITH PROPOFOL N/A 07/25/2017   Procedure: ESOPHAGOGASTRODUODENOSCOPY (EGD) WITH PROPOFOL;  Surgeon:  Manya Silvas, MD;  Location: [facility] ENDOSCOPY;  Service: Endoscopy;  Laterality: N/A;  . KNEE ARTHROSCOPY    . LAPAROSCOPIC GASTRIC SLEEVE RESECTION WITH HIATAL HERNIA REPAIR    . TONSILLECTOMY    . TOTAL HIP ARTHROPLASTY Left 01/26/2016   Procedure: LEFT TOTAL HIP ARTHROPLASTY ANTERIOR APPROACH;  Surgeon: Leandrew Koyanagi, MD;  Location: [facility];  Service: Orthopedics;  Laterality: Left;  . TRANSFORAMINAL LUMBAR INTERBODY FUSION (TLIF) WITH PEDICLE SCREW FIXATION 3 LEVEL  08/2019   Basil Dess, MD L2-L5   Social History   Occupational History  . Not on file  Tobacco Use  . Smoking status: Never Smoker  . Smokeless tobacco: Never Used  Vaping Use  . Vaping Use: Never used  Substance and Sexual Activity  . Alcohol use: Not Currently    Comment: occasional wine  . Drug use: No  . Sexual activity: Not Currently

## 2020-10-24 MED ORDER — SODIUM HYALURONATE 60 MG/3ML IX PRSY
60.0000 mg | PREFILLED_SYRINGE | INTRA_ARTICULAR | Status: AC | PRN
  Administered 2020-10-22: 60 mg via INTRA_ARTICULAR

## 2020-10-24 MED ORDER — LIDOCAINE HCL 1 % IJ SOLN
2.0000 mL | INTRAMUSCULAR | Status: AC | PRN
  Administered 2020-10-22: 2 mL

## 2020-10-24 MED ORDER — BUPIVACAINE HCL 0.25 % IJ SOLN
2.0000 mL | INTRAMUSCULAR | Status: AC | PRN
  Administered 2020-10-22: 2 mL via INTRA_ARTICULAR

## 2020-10-26 ENCOUNTER — Ambulatory Visit: Payer: 59

## 2020-10-26 ENCOUNTER — Encounter: Payer: 59 | Admitting: Occupational Therapy

## 2020-10-26 ENCOUNTER — Other Ambulatory Visit: Payer: Self-pay

## 2020-10-26 DIAGNOSIS — M6281 Muscle weakness (generalized): Secondary | ICD-10-CM

## 2020-10-26 DIAGNOSIS — R2681 Unsteadiness on feet: Secondary | ICD-10-CM

## 2020-10-26 DIAGNOSIS — R2689 Other abnormalities of gait and mobility: Secondary | ICD-10-CM

## 2020-10-26 DIAGNOSIS — R278 Other lack of coordination: Secondary | ICD-10-CM

## 2020-10-26 NOTE — Therapy (Signed)
[place] MAIN [facility] SERVICES [address] [place], [place], [address] Phone: [phone]   Fax:  [phone]  Physical Therapy Treatment  Patient Details  Name: Vanessa Medina MRN: [id number] Date of Birth: 1971/03/14 Referring Provider (PT): Dr. Erling Cruz   Encounter Date: 10/26/2020   PT End of Session - 10/26/20 1128    Visit Number 13    Number of Visits 17    Date for PT Re-Evaluation 10/27/20    Authorization Type eval: 12/29    PT Start Time 0927    PT Stop Time 1015    PT Time Calculation (min) 48 min    Equipment Utilized During Treatment Gait belt    Activity Tolerance Patient tolerated treatment well    Behavior During Therapy [facility] for tasks assessed/performed           Past Medical History:  Diagnosis Date  . Anemia   . Cervical cancer ([place])   . Family history of adverse reaction to anesthesia     " my mother takes a long time time wake up"  . GERD (gastroesophageal reflux disease)   . Headache    migraine  . History of shingles   . Hypertension   . Migraine   . Multiple allergies   . Obesity   . Osteoarthritis    left hip  . PONV (postoperative nausea and vomiting)   . Sleep apnea    does not wear CPAP  . Wears glasses     Past Surgical History:  Procedure Laterality Date  . ABDOMINAL HYSTERECTOMY    . APPENDECTOMY    . BILIOPANCREATIC DIVERSION     with duodenal switch laparoscopic   . CHOLECYSTECTOMY    . DIAGNOSTIC LAPAROSCOPY     LOA  . ESOPHAGOGASTRODUODENOSCOPY (EGD) WITH PROPOFOL N/A 07/25/2017   Procedure: ESOPHAGOGASTRODUODENOSCOPY (EGD) WITH PROPOFOL;  Surgeon: Manya Silvas, MD;  Location: [facility] ENDOSCOPY;  Service: Endoscopy;  Laterality: N/A;  . KNEE ARTHROSCOPY    . LAPAROSCOPIC GASTRIC SLEEVE RESECTION WITH HIATAL HERNIA REPAIR    . TONSILLECTOMY    . TOTAL HIP ARTHROPLASTY Left 01/26/2016   Procedure: LEFT TOTAL HIP ARTHROPLASTY ANTERIOR APPROACH;  Surgeon: Leandrew Koyanagi, MD;   Location: [facility];  Service: Orthopedics;  Laterality: Left;  . TRANSFORAMINAL LUMBAR INTERBODY FUSION (TLIF) WITH PEDICLE SCREW FIXATION 3 LEVEL  08/2019   Basil Dess, MD L2-L5    There were no vitals filed for this visit.   Subjective Assessment - 10/26/20 0926    Subjective Pt reports she had "20/10" pain last night. Pt had shot to R knee on friday, she presents wearing brace. Pt says pain currently 7/10. She says because of pain she spent weekend in recliner.    Pertinent History 50 year old female with history of HTN, migraines, morbid obesity--BMI-41, chronic hypotension, multiple back surgeries with chronic pain with LLE lumbar radiculopathy who was scheduled to have spinal cord stimulator placed by Dr. Davy Pique on 08/11/2020 but unable to perform surgery due to scar tissue.  History taken from patient and chart review.  Post op on awakening, she had excruciating pain RLE with plegia and and allodynia. She was admitted for work up by Dr. Ronnald Ramp from surgical center on 08/11/2020. Thoracic MRI done revealing subtle increased T2/STIR intensity distal cord at T12-L1 level suspicious for edema/acute spinal cord injury, no CSF leak as well as prior PLIF L2-L5 with residual foraminal protrusion L3/5 potentially irritating right L3 nerve. She was started on  IV Dilaudid, gabapentin as well as IV Decadron for management of pain, headaches and severe neuropathy.  She continues to have pain in RLE but is having some motor return, has constant headache, decreased hearing in right> left ear, constipation with nausea as well as epigastric pain and weakness. Therapy ongoing and CIR recommended due to functional decline.  Of note, she reports 50 lbs weigh loss in the past 3-4 months--due to issues with N/V/intake. Has had two falls last month---no recall of incident leading to fall question due to syncope. Was in process of work up for renal disease. She has had issue with LLE weakness with pain for the past year and  being followed by Dr. Ernestina Patches. Did well with stimulator trial.    How long can you sit comfortably? 1 hour    How long can you stand comfortably? 8 mins    How long can you walk comfortably? 20-30 minutes    Currently in Pain? Yes    Pain Score 7     Pain Location Knee    Pain Orientation Right    Pain Onset More than a month ago   pt with chronic back pain from over a month ago but with acute pain as well due to recent fall at home           Therapeutic Exercise:    Nustep level 0, x 4 minutes SPM 52 Nustep level 1 at x3 minutes SPM 50s  Nustep level 2 at x3 minutes SPM 40s-50s BP 101/51 L UE, HR 77 SPO2 99%; pt reports no sx   Gait with SPC, emphasis of exercise on LE msucular endurance (focus on dorsiflexors) and functional capacity x10 laps in gym. No reports of sx. Pt did require cuing for gait technique: heel-strike B, step-length, equal weight-bearing on BLES.    Phsyioball rollouts within pain-free range - x multiple repswith approx 3-5 second holds at end range. Demonstration and VC provided. Pt shows increased ROM with reps and reports improvement in back sx.   LAQ 2# AW on LLE and no AW R LE, 3x15 pt rates medium   Heel raises 2x20 - medium difficulty R; L easy  Seated dorsiflexion with 2# AW LLE, and no AW R LE x20     PT Short Term Goals - 10/14/20 1649      PT SHORT TERM GOAL #1   Title Pt will be independent with HEP in order to improve strength and balance in order to decrease fall risk and improve function at home and work.    Baseline 10/14/2020 on-going/deferred    Time 6    Period Weeks    Status On-going    Target Date 10/13/20             PT Long Term Goals - 10/14/20 1650      PT LONG TERM GOAL #1   Title Patient (< 55 years old) will complete five times sit to stand test in < 10 seconds indicating an increased LE strength and improved balance.    Baseline 12/29: 38.45s; 10/14/2020 42.8 seconds    Time 12    Period Weeks    Status On-going     Target Date 11/24/20      PT LONG TERM GOAL #2   Title Pt will decrease TUG to below 14 seconds/decrease in order to demonstrate decreased fall risk.    Baseline 12/29: 32.94s; 10/14/2020 43.12 sec    Time 12    Period Weeks  Status On-going    Target Date 11/24/20      PT LONG TERM GOAL #3   Title Pt will decrease DHI score by at least 18 points in order to demonstrate clinically significant reduction in disability    Baseline 12/29: 30; 10/14/2020 62, indicating severe handicap    Time 12    Period Weeks    Status New    Target Date 11/24/20      PT LONG TERM GOAL #4   Title Patient will increase 10 meter walk test to >1.18m/s as to improve gait speed for better community ambulation and to reduce fall risk.    Baseline 12/29: 0.37 m/s; 0.38 m/s    Time 12    Period Weeks    Status On-going    Target Date 11/24/20      PT LONG TERM GOAL #5   Title Patient will increase FOTO score to equal to or greater than 64 to demonstrate statistically significant improvement in mobility and quality of life.    Baseline 12/29: 52; 10/14/2020 45    Time 12    Period Weeks    Status On-going    Target Date 11/24/20                 Plan - 10/26/20 1129    Clinical Impression Statement Pt shows improvement in back sx and spinal flexion with seated stability ball rollouts. She was able to ambulate for multiple laps today in clinic with SPC, CGA, indicating improved functional capacity. Pt does require cuing for heel-strike B, and has difficulty increasing step length and weight-bearing equally on BLEs. Pt will benefit from further skilled therapy to improve pain, BLE strength and gait mechanics to increase ease and safety with all functional mobility.    Personal Factors and Comorbidities Comorbidity 3+;Time since onset of injury/illness/exacerbation    Comorbidities HYPOtension, paraparesis, SCI without spinal bone injury, DDD, cervical cancer, acute kidney injury, GERD, asthma,  bradycardia, L total hip replacement, hypokalemia    Examination-Activity Limitations Squat;Lift;Stairs;Bend;Stand;Reach Overhead;Carry;Transfers    Examination-Participation Restrictions Cleaning;Laundry;Meal Prep;Community Activity;Driving;Occupation    Stability/Clinical Decision Making Evolving/Moderate complexity    Rehab Potential Fair    PT Frequency 2x / week    PT Duration 12 weeks    PT Treatment/Interventions ADLs/Self Care Home Management;Aquatic Therapy;Gait training;Stair training;Functional mobility training;Therapeutic activities;Therapeutic exercise;Balance training;Moist Heat;Manual techniques;Patient/family education;Neuromuscular re-education;Passive range of motion;Energy conservation;Vestibular;Joint Manipulations;Spinal Manipulations    PT Next Visit Plan Instruction in pain modulation techniques, progress balance interventions, ambulate through clinic; progress standing therex    PT Home Exercise Plan PROGRESS note: add new HEP, initiate UBE bike for cardiovasc endurance, R ankle motor control/strength, focus on STS and ambulation    Consulted and Agree with Plan of Care Patient           Patient will benefit from skilled therapeutic intervention in order to improve the following deficits and impairments:  Abnormal gait,Dizziness,Improper body mechanics,Decreased mobility,Cardiopulmonary status limiting activity,Decreased activity tolerance,Decreased endurance,Decreased range of motion,Decreased strength,Decreased balance,Decreased safety awareness,Difficulty walking,Impaired flexibility,Obesity  Visit Diagnosis: Muscle weakness (generalized)  Unsteadiness on feet  Other abnormalities of gait and mobility  Other lack of coordination     Problem List Patient Active Problem List   Diagnosis Date Noted  . Nerve pain 10/18/2020  . Incomplete paraplegia ([place]) 10/18/2020  . Orthostatic hypotension 10/18/2020  . Chronic migraine without aura without status  migrainosus, not intractable   . Hypokalemia   . Adjustment reaction with anxiety and depression   .  Spinal cord injury, lumbar, without spinal bone injury, sequela ([place]) 08/18/2020  . Paraparesis ([place])   . Post-operative pain   . Hypotension   . Slow transit constipation   . AKI (acute kidney injury) ([place])   . Right leg weakness 08/11/2020  . DDD (degenerative disc disease), lumbar 09/02/2019    Class: Chronic  . Degenerative disc disease, lumbar 09/02/2019  . Chest tightness 09/23/2018  . Bradycardia 09/23/2018  . Labile blood pressure 03/08/2018  . Chronic low back pain 09/03/2017  . History of total hip replacement, left 01/26/2016  . EDEMA 04/28/2008  . LEG PAIN, BILATERAL 12/16/2007  . CERVICAL CANCER 07/02/2007  . MORBID OBESITY 07/02/2007  . DEPRESSION 07/02/2007  . COMMON MIGRAINE 07/02/2007  . ALLERGIC RHINITIS 07/02/2007  . ASTHMA 07/02/2007  . GERD 07/02/2007  . ELEVATED BLOOD PRESSURE WITHOUT DIAGNOSIS OF HYPERTENSION 07/02/2007   Ricard Dillon PT, DPT 10/26/2020, 11:41 AM  [place] MAIN [facility] SERVICES [address] [place], [place], [address] Phone: [phone]   Fax:  [phone]  Name: Vanessa Medina MRN: [id number] Date of Birth: 02-24-1971

## 2020-10-27 ENCOUNTER — Other Ambulatory Visit: Payer: Self-pay | Admitting: Specialist

## 2020-10-28 ENCOUNTER — Other Ambulatory Visit: Payer: Self-pay

## 2020-10-28 ENCOUNTER — Ambulatory Visit: Payer: 59

## 2020-10-28 ENCOUNTER — Encounter: Payer: 59 | Admitting: Occupational Therapy

## 2020-10-28 DIAGNOSIS — M6281 Muscle weakness (generalized): Secondary | ICD-10-CM | POA: Diagnosis not present

## 2020-10-28 DIAGNOSIS — R2681 Unsteadiness on feet: Secondary | ICD-10-CM

## 2020-10-28 DIAGNOSIS — R2689 Other abnormalities of gait and mobility: Secondary | ICD-10-CM

## 2020-10-28 NOTE — Therapy (Signed)
[place] MAIN [facility] SERVICES [place], [place], [address] Phone: [phone]   Fax:  [phone]  Physical Therapy Treatment/RECERTIFICATION  Patient Details  Name: Vanessa Medina MRN: [id number] Date of Birth: Jan 04, 1971 Referring Provider (PT): Dr. Erling Cruz   Encounter Date: 10/28/2020   PT End of Session - 10/29/20 0814    Visit Number 14    Number of Visits 53    Date for PT Re-Evaluation 01/20/21    Authorization Type eval: 35/36; prog note/recert 1/44/3154    PT Start Time 0926    PT Stop Time 1014    PT Time Calculation (min) 48 min    Equipment Utilized During Treatment Gait belt    Activity Tolerance Patient tolerated treatment well    Behavior During Therapy [facility] for tasks assessed/performed           Past Medical History:  Diagnosis Date  . Anemia   . Cervical cancer ([place])   . Family history of adverse reaction to anesthesia     " my mother takes a long time time wake up"  . GERD (gastroesophageal reflux disease)   . Headache    migraine  . History of shingles   . Hypertension   . Migraine   . Multiple allergies   . Obesity   . Osteoarthritis    left hip  . PONV (postoperative nausea and vomiting)   . Sleep apnea    does not wear CPAP  . Wears glasses     Past Surgical History:  Procedure Laterality Date  . ABDOMINAL HYSTERECTOMY    . APPENDECTOMY    . BILIOPANCREATIC DIVERSION     with duodenal switch laparoscopic   . CHOLECYSTECTOMY    . DIAGNOSTIC LAPAROSCOPY     LOA  . ESOPHAGOGASTRODUODENOSCOPY (EGD) WITH PROPOFOL N/A 07/25/2017   Procedure: ESOPHAGOGASTRODUODENOSCOPY (EGD) WITH PROPOFOL;  Surgeon: Manya Silvas, MD;  Location: [facility] ENDOSCOPY;  Service: Endoscopy;  Laterality: N/A;  . KNEE ARTHROSCOPY    . LAPAROSCOPIC GASTRIC SLEEVE RESECTION WITH HIATAL HERNIA REPAIR    . TONSILLECTOMY    . TOTAL HIP ARTHROPLASTY Left 01/26/2016   Procedure: LEFT TOTAL HIP ARTHROPLASTY ANTERIOR  APPROACH;  Surgeon: Leandrew Koyanagi, MD;  Location: [facility];  Service: Orthopedics;  Laterality: Left;  . TRANSFORAMINAL LUMBAR INTERBODY FUSION (TLIF) WITH PEDICLE SCREW FIXATION 3 LEVEL  08/2019   Basil Dess, MD L2-L5    There were no vitals filed for this visit.   Subjective Assessment - 10/28/20 0926    Subjective Pt reports 7/10 pain today in lower back and down in her leg and into her knee. Pt reports she had fun yesterday and went on adventure with her friend and did a lot of walking. Pt reports she's feeling tired today. Pt reports recent onset of vertigo, but that she has had these sx in the past and will be seeing her ENT for it.    Pertinent History 50 year old female with history of HTN, migraines, morbid obesity--BMI-41, chronic hypotension, multiple back surgeries with chronic pain with LLE lumbar radiculopathy who was scheduled to have spinal cord stimulator placed by Dr. Davy Pique on 08/11/2020 but unable to perform surgery due to scar tissue.  History taken from patient and chart review.  Post op on awakening, she had excruciating pain RLE with plegia and and allodynia. She was admitted for work up by Dr. Ronnald Ramp from surgical center on 08/11/2020. Thoracic MRI done revealing subtle increased T2/STIR intensity  distal cord at T12-L1 level suspicious for edema/acute spinal cord injury, no CSF leak as well as prior PLIF L2-L5 with residual foraminal protrusion L3/5 potentially irritating right L3 nerve. She was started on IV Dilaudid, gabapentin as well as IV Decadron for management of pain, headaches and severe neuropathy.  She continues to have pain in RLE but is having some motor return, has constant headache, decreased hearing in right> left ear, constipation with nausea as well as epigastric pain and weakness. Therapy ongoing and CIR recommended due to functional decline.  Of note, she reports 50 lbs weigh loss in the past 3-4 months--due to issues with N/V/intake. Has had two falls last month---no  recall of incident leading to fall question due to syncope. Was in process of work up for renal disease. She has had issue with LLE weakness with pain for the past year and being followed by Dr. Ernestina Patches. Did well with stimulator trial.    How long can you sit comfortably? 1 hour    How long can you stand comfortably? 8 mins    How long can you walk comfortably? 20-30 minutes    Currently in Pain? Yes    Pain Score 7     Pain Location Back    Pain Orientation Lower    Pain Onset More than a month ago   pt with chronic back pain from over a month ago but with acute pain as well due to recent fall at home          Treatment-  Education provided throughout to pt regarding technique with all tests/exercises and assessments and their indications for therapy. VC/TC and demonstration were utilized for explanation of body mechanics, hand/foot placement and sequencing.  FOTO: 45%  DHI: score 72 (54+ indicates severe handicap)  TUG 34.12 sec   5xSTS: 36 seconds  STS: 2x5 with UE assist; pt rates last set as "hard" Seated rest breaks between sets  10MWT: 0.31 m/s with SPC   Stability ball rollouts 3 x multiple reps; pt reports pain-relieving position  Standing heel raises at RW 2x12  Nustep level 1 x 5 minutes; pt rates as RPE 5/10; SPM 50s-60s throughout; At 4 min pt rates RPE as 6/10 where SPM dropped to 50s.     Assessment: Goals reassessed this session. Pt improved on 5xSTS (36 seconds), and TUG (34.12 seconds) indicating improvements in LE strength and power, and a decrease in fall risk. Pt FOTO score (45%) did not change from previous assessment. Pt's DHI score (72) was higher than previous assessment indicating severe handicap. However, pt with recent onset of vertigo, which she has had before, resulting in worse score this session. Pt saw small decrease in 10MWT (0.31 m/s with SPC), indicating gait/speed ability has decreased slightly. Due to pt's high motivation in therapy, and  recent improvements in scores since her BP sx have improved, PT is increasing pt frequency to 3x/week in order to maximize her progress and functional capacity. Patient's condition has the potential to improve in response to therapy. Maximum improvement is yet to be obtained. The anticipated improvement is attainable and reasonable in a generally predictable time. Pt will benefit from further skilled therapy to decrease fall risk and improve BLE strength and power, and gait speed/ability in order to increase community participation and QOL.    PT Education - 10/29/20 0810    Education Details Pt educated on reassessment of goals and their indications for therapy    Person(s) Educated Patient  Methods Explanation    Comprehension Verbalized understanding            PT Short Term Goals - 10/29/20 3419      PT SHORT TERM GOAL #1   Title Pt will be independent with HEP in order to improve strength and balance in order to decrease fall risk and improve function at home and work.    Baseline 10/14/2020 on-going/deferred; 10/28/2020 ongoing pt has resumed HEP, reports doing what she can/HEP to be advanced    Time 6    Period Weeks    Status On-going    Target Date 12/09/20             PT Long Term Goals - 10/29/20 6222      PT LONG TERM GOAL #1   Title Patient (< 42 years old) will complete five times sit to stand test in < 10 seconds indicating an increased LE strength and improved balance.    Baseline 12/29: 38.45s; 10/14/2020 42.8 seconds; 10/28/2020 36 seconds    Time 12    Period Weeks    Status On-going    Target Date 01/20/21      PT LONG TERM GOAL #2   Title Pt will decrease TUG to below 14 seconds/decrease in order to demonstrate decreased fall risk.    Baseline 12/29: 32.94s; 10/14/2020 43.12 sec; 10/28/2020 34.12 seconds    Time 12    Period Weeks    Status On-going    Target Date 01/20/21      PT LONG TERM GOAL #3   Title Pt will decrease DHI score by at least 18  points in order to demonstrate clinically significant reduction in disability    Baseline 12/29: 30; 10/14/2020 62, indicating severe handicap; 10/28/2020 74    Time 12    Period Weeks    Status New    Target Date 01/20/21      PT LONG TERM GOAL #4   Title Patient will increase 10 meter walk test to >1.81m/s as to improve gait speed for better community ambulation and to reduce fall risk.    Baseline 12/29: 0.37 m/s; 0.38 m/s; 2/10/08/2020 0.31 m/s    Time 12    Period Weeks    Status On-going    Target Date 01/20/21      PT LONG TERM GOAL #5   Title Patient will increase FOTO score to equal to or greater than 64 to demonstrate statistically significant improvement in mobility and quality of life.    Baseline 12/29: 52; 10/14/2020 45; 10/28/2020 45    Time 12    Period Weeks    Status On-going    Target Date 01/20/21                 Plan - 10/29/20 0840    Clinical Impression Statement Goals reassessed this session. Pt improved on 5xSTS (36 seconds), and TUG (34.12 seconds) indicating improvements in LE strength and power, and a decrease in fall risk. Pt FOTO score (45%) did not change from previous assessment. Pt's DHI score (72) was higher than previous assessment indicating severe handicap. However, pt with recent onset of vertigo, which she has had before, resulting in worse score this session. Pt saw small decrease in 10MWT (0.31 m/s with SPC), indicating gait/speed ability has decreased slightly. Due to pt's high motivation in therapy, and recent improvements in scores since her BP sx have improved, PT is increasing pt frequency to 3x/week in order to maximize her progress  and functional capacity. Patient's condition has the potential to improve in response to therapy. Maximum improvement is yet to be obtained. The anticipated improvement is attainable and reasonable in a generally predictable time. Pt will benefit from further skilled therapy to decrease fall risk and improve BLE  strength and power, and gait speed/ability in order to increase community participation and QOL.    Personal Factors and Comorbidities Comorbidity 3+;Time since onset of injury/illness/exacerbation    Comorbidities HYPOtension, paraparesis, SCI without spinal bone injury, DDD, cervical cancer, acute kidney injury, GERD, asthma, bradycardia, L total hip replacement, hypokalemia    Examination-Activity Limitations Squat;Lift;Stairs;Bend;Stand;Reach Overhead;Carry;Transfers    Examination-Participation Restrictions Cleaning;Laundry;Meal Prep;Community Activity;Driving;Occupation    Stability/Clinical Decision Making Evolving/Moderate complexity    Rehab Potential Fair    PT Frequency 3x / week    PT Duration 12 weeks    PT Treatment/Interventions ADLs/Self Care Home Management;Aquatic Therapy;Gait training;Stair training;Functional mobility training;Therapeutic activities;Therapeutic exercise;Balance training;Moist Heat;Manual techniques;Patient/family education;Neuromuscular re-education;Passive range of motion;Energy conservation;Vestibular;Joint Manipulations;Spinal Manipulations    PT Next Visit Plan Instruction in pain modulation techniques, progress balance interventions, ambulate through clinic; progress standing therex; progress standing endurance exercises    PT Home Exercise Plan PROGRESS note: add new HEP, initiate UBE bike for cardiovasc endurance, R ankle motor control/strength, focus on STS and ambulation    Consulted and Agree with Plan of Care Patient           Patient will benefit from skilled therapeutic intervention in order to improve the following deficits and impairments:  Abnormal gait,Dizziness,Improper body mechanics,Decreased mobility,Cardiopulmonary status limiting activity,Decreased activity tolerance,Decreased endurance,Decreased range of motion,Decreased strength,Decreased balance,Decreased safety awareness,Difficulty walking,Impaired flexibility,Obesity  Visit  Diagnosis: Muscle weakness (generalized)  Other abnormalities of gait and mobility  Unsteadiness on feet     Problem List Patient Active Problem List   Diagnosis Date Noted  . Nerve pain 10/18/2020  . Incomplete paraplegia ([place]) 10/18/2020  . Orthostatic hypotension 10/18/2020  . Chronic migraine without aura without status migrainosus, not intractable   . Hypokalemia   . Adjustment reaction with anxiety and depression   . Spinal cord injury, lumbar, without spinal bone injury, sequela ([place]) 08/18/2020  . Paraparesis ([place])   . Post-operative pain   . Hypotension   . Slow transit constipation   . AKI (acute kidney injury) ([place])   . Right leg weakness 08/11/2020  . DDD (degenerative disc disease), lumbar 09/02/2019    Class: Chronic  . Degenerative disc disease, lumbar 09/02/2019  . Chest tightness 09/23/2018  . Bradycardia 09/23/2018  . Labile blood pressure 03/08/2018  . Chronic low back pain 09/03/2017  . History of total hip replacement, left 01/26/2016  . EDEMA 04/28/2008  . LEG PAIN, BILATERAL 12/16/2007  . CERVICAL CANCER 07/02/2007  . MORBID OBESITY 07/02/2007  . DEPRESSION 07/02/2007  . COMMON MIGRAINE 07/02/2007  . ALLERGIC RHINITIS 07/02/2007  . ASTHMA 07/02/2007  . GERD 07/02/2007  . ELEVATED BLOOD PRESSURE WITHOUT DIAGNOSIS OF HYPERTENSION 07/02/2007   Ricard Dillon PT, DPT 10/29/2020, 9:01 AM  [place] MAIN [facility] SERVICES [address] [place], [place], [address] Phone: [phone]   Fax:  [phone]  Name: Vanessa Medina MRN: [id number] Date of Birth: 12/02/1970

## 2020-11-02 ENCOUNTER — Encounter: Payer: 59 | Admitting: Occupational Therapy

## 2020-11-02 ENCOUNTER — Ambulatory Visit: Payer: 59 | Attending: Physical Medicine and Rehabilitation

## 2020-11-02 ENCOUNTER — Other Ambulatory Visit: Payer: Self-pay

## 2020-11-02 DIAGNOSIS — M6281 Muscle weakness (generalized): Secondary | ICD-10-CM | POA: Diagnosis present

## 2020-11-02 DIAGNOSIS — R2681 Unsteadiness on feet: Secondary | ICD-10-CM | POA: Insufficient documentation

## 2020-11-02 DIAGNOSIS — G8929 Other chronic pain: Secondary | ICD-10-CM | POA: Diagnosis present

## 2020-11-02 DIAGNOSIS — M545 Low back pain, unspecified: Secondary | ICD-10-CM | POA: Diagnosis present

## 2020-11-02 DIAGNOSIS — R2689 Other abnormalities of gait and mobility: Secondary | ICD-10-CM | POA: Diagnosis present

## 2020-11-02 NOTE — Therapy (Signed)
[place] MAIN [facility] SERVICES [address] [place], [place], [address] Phone: [phone]   Fax:  [phone]  Physical Therapy Treatment  Patient Details  Name: Vanessa Medina MRN: [id number] Date of Birth: 02/04/1971 Referring Provider (PT): Dr. Erling Cruz   Encounter Date: 11/02/2020   PT End of Session - 11/02/20 1034    Visit Number 15    Number of Visits 53    Date for PT Re-Evaluation 01/20/21    Authorization Type eval: 32/44; prog note/recert 0/06/2724    PT Start Time 0931    PT Stop Time 1016    PT Time Calculation (min) 45 min    Equipment Utilized During Treatment Gait belt    Activity Tolerance Patient tolerated treatment well    Behavior During Therapy [facility] for tasks assessed/performed           Past Medical History:  Diagnosis Date  . Anemia   . Cervical cancer ([place])   . Family history of adverse reaction to anesthesia     " my mother takes a long time time wake up"  . GERD (gastroesophageal reflux disease)   . Headache    migraine  . History of shingles   . Hypertension   . Migraine   . Multiple allergies   . Obesity   . Osteoarthritis    left hip  . PONV (postoperative nausea and vomiting)   . Sleep apnea    does not wear CPAP  . Wears glasses     Past Surgical History:  Procedure Laterality Date  . ABDOMINAL HYSTERECTOMY    . APPENDECTOMY    . BILIOPANCREATIC DIVERSION     with duodenal switch laparoscopic   . CHOLECYSTECTOMY    . DIAGNOSTIC LAPAROSCOPY     LOA  . ESOPHAGOGASTRODUODENOSCOPY (EGD) WITH PROPOFOL N/A 07/25/2017   Procedure: ESOPHAGOGASTRODUODENOSCOPY (EGD) WITH PROPOFOL;  Surgeon: Manya Silvas, MD;  Location: [facility] ENDOSCOPY;  Service: Endoscopy;  Laterality: N/A;  . KNEE ARTHROSCOPY    . LAPAROSCOPIC GASTRIC SLEEVE RESECTION WITH HIATAL HERNIA REPAIR    . TONSILLECTOMY    . TOTAL HIP ARTHROPLASTY Left 01/26/2016   Procedure: LEFT TOTAL HIP ARTHROPLASTY ANTERIOR APPROACH;  Surgeon:  Leandrew Koyanagi, MD;  Location: [facility];  Service: Orthopedics;  Laterality: Left;  . TRANSFORAMINAL LUMBAR INTERBODY FUSION (TLIF) WITH PEDICLE SCREW FIXATION 3 LEVEL  08/2019   Basil Dess, MD L2-L5    There were no vitals filed for this visit.   Subjective Assessment - 11/02/20 0937    Subjective Pt reports she had a difficult weekend. She says she's still feeling dizzy. Pt reports pain is 8/10 in her back. She reports continued fluttering sensation in ear and that her sx started with her medication change.    Pertinent History 50 year old female with history of HTN, migraines, morbid obesity--BMI-41, chronic hypotension, multiple back surgeries with chronic pain with LLE lumbar radiculopathy who was scheduled to have spinal cord stimulator placed by Dr. Davy Pique on 08/11/2020 but unable to perform surgery due to scar tissue.  History taken from patient and chart review.  Post op on awakening, she had excruciating pain RLE with plegia and and allodynia. She was admitted for work up by Dr. Ronnald Ramp from surgical center on 08/11/2020. Thoracic MRI done revealing subtle increased T2/STIR intensity distal cord at T12-L1 level suspicious for edema/acute spinal cord injury, no CSF leak as well as prior PLIF L2-L5 with residual foraminal protrusion L3/5 potentially irritating right L3  nerve. She was started on IV Dilaudid, gabapentin as well as IV Decadron for management of pain, headaches and severe neuropathy.  She continues to have pain in RLE but is having some motor return, has constant headache, decreased hearing in right> left ear, constipation with nausea as well as epigastric pain and weakness. Therapy ongoing and CIR recommended due to functional decline.  Of note, she reports 50 lbs weigh loss in the past 3-4 months--due to issues with N/V/intake. Has had two falls last month---no recall of incident leading to fall question due to syncope. Was in process of work up for renal disease. She has had issue with LLE  weakness with pain for the past year and being followed by Dr. Ernestina Patches. Did well with stimulator trial.    How long can you sit comfortably? 1 hour    How long can you stand comfortably? 8 mins    How long can you walk comfortably? 20-30 minutes    Currently in Pain? Yes    Pain Score 8     Pain Location Back    Pain Orientation Lower    Pain Onset More than a month ago   pt with chronic back pain from over a month ago but with acute pain as well due to recent fall at home          Therapeutic Exercise:    Nustep level 2 x 3 minutes, VC to increase SPM; pt rates "medium" Nustep level 3 x 3 VC for increased SPM; SPM 50s-60s Level 4 attempted but pt reported it felt "hard on her knees" so decreased back to level 3  STS 2x10, BUE assist.   Ambulation with SPC, emphasis of exercise on LE msucular endurance (and focus on dorsiflexion activation B), as well as functional capacity - x 474 ft in gym. CGA provided. No reports of sx with ambulating. Pt continues to require cuing for gait technique as well: heel-strike B to increase safety and dorsiflexion activation.     Neuromuscular Re-Education:  Marye Round testing d/t continued vestibular sx.  Tested R and L posterior canals: Pt with sx on R (however, no nystagmus noted), no sx on L.  Tested R side 2x. Pt maintained each position until symptoms resolved.  Epley maneuver performed to treat R posterior canal - 1x.  PT required CGA-min assist throughout testing for positioning: seated>supine, supine>seated, side-lying>seated.  Pt maintained each position until symptoms resolved.     PT Short Term Goals - 10/29/20 9767      PT SHORT TERM GOAL #1   Title Pt will be independent with HEP in order to improve strength and balance in order to decrease fall risk and improve function at home and work.    Baseline 10/14/2020 on-going/deferred; 10/28/2020 ongoing pt has resumed HEP, reports doing what she can/HEP to be advanced    Time 6    Period  Weeks    Status On-going    Target Date 12/09/20             PT Long Term Goals - 10/29/20 3419      PT LONG TERM GOAL #1   Title Patient (< 51 years old) will complete five times sit to stand test in < 10 seconds indicating an increased LE strength and improved balance.    Baseline 12/29: 38.45s; 10/14/2020 42.8 seconds; 10/28/2020 36 seconds    Time 12    Period Weeks    Status On-going    Target Date 01/20/21  PT LONG TERM GOAL #2   Title Pt will decrease TUG to below 14 seconds/decrease in order to demonstrate decreased fall risk.    Baseline 12/29: 32.94s; 10/14/2020 43.12 sec; 10/28/2020 34.12 seconds    Time 12    Period Weeks    Status On-going    Target Date 01/20/21      PT LONG TERM GOAL #3   Title Pt will decrease DHI score by at least 18 points in order to demonstrate clinically significant reduction in disability    Baseline 12/29: 30; 10/14/2020 62, indicating severe handicap; 10/28/2020 74    Time 12    Period Weeks    Status New    Target Date 01/20/21      PT LONG TERM GOAL #4   Title Patient will increase 10 meter walk test to >1.55m/s as to improve gait speed for better community ambulation and to reduce fall risk.    Baseline 12/29: 0.37 m/s; 0.38 m/s; 2/10/08/2020 0.31 m/s    Time 12    Period Weeks    Status On-going    Target Date 01/20/21      PT LONG TERM GOAL #5   Title Patient will increase FOTO score to equal to or greater than 64 to demonstrate statistically significant improvement in mobility and quality of life.    Baseline 12/29: 52; 10/14/2020 45; 10/28/2020 45    Time 12    Period Weeks    Status On-going    Target Date 01/20/21                 Plan - 11/02/20 1037    Clinical Impression Statement Performed Dix-Hallpike testing this session due to continued reports of dizziness and sensation of room spinning with head turns/rolling in bed/looking up. Pt negative on L side, and possible positive on R side, with reports of room  briefly spinning. However, no nystagmus noted. PT then performed Epley maneuver to treat R posterior canal. After manuever performed pt reported continued sensation of "fluttering" in her ear and no other changes reported. PT also instructed pt to report these sx at her upcoming ENT appointment along with her concerns regarding dizziness and her medication with her doctor. Testing followed with gait training where pt was able to correct technique/heel strike more consistently within session. Pt will benefit from further skilled therapy to improve gait, mobility and balance to improve QOL.    Personal Factors and Comorbidities Comorbidity 3+;Time since onset of injury/illness/exacerbation    Comorbidities HYPOtension, paraparesis, SCI without spinal bone injury, DDD, cervical cancer, acute kidney injury, GERD, asthma, bradycardia, L total hip replacement, hypokalemia    Examination-Activity Limitations Squat;Lift;Stairs;Bend;Stand;Reach Overhead;Carry;Transfers    Examination-Participation Restrictions Cleaning;Laundry;Meal Prep;Community Activity;Driving;Occupation    Stability/Clinical Decision Making Evolving/Moderate complexity    Rehab Potential Fair    PT Frequency 3x / week    PT Duration 12 weeks    PT Treatment/Interventions ADLs/Self Care Home Management;Aquatic Therapy;Gait training;Stair training;Functional mobility training;Therapeutic activities;Therapeutic exercise;Balance training;Moist Heat;Manual techniques;Patient/family education;Neuromuscular re-education;Passive range of motion;Energy conservation;Vestibular;Joint Manipulations;Spinal Manipulations    PT Next Visit Plan Instruction in pain modulation techniques, progress balance interventions, ambulate through clinic; progress standing therex; progress standing endurance exercises/gait technique    PT Home Exercise Plan PROGRESS note: add new HEP, initiate UBE bike for cardiovasc endurance, R ankle motor control/strength, focus on STS  and ambulation    Consulted and Agree with Plan of Care Patient           Patient will benefit  from skilled therapeutic intervention in order to improve the following deficits and impairments:  Abnormal gait,Dizziness,Improper body mechanics,Decreased mobility,Cardiopulmonary status limiting activity,Decreased activity tolerance,Decreased endurance,Decreased range of motion,Decreased strength,Decreased balance,Decreased safety awareness,Difficulty walking,Impaired flexibility,Obesity  Visit Diagnosis: Unsteadiness on feet  Other abnormalities of gait and mobility  Muscle weakness (generalized)     Problem List Patient Active Problem List   Diagnosis Date Noted  . Nerve pain 10/18/2020  . Incomplete paraplegia ([place]) 10/18/2020  . Orthostatic hypotension 10/18/2020  . Chronic migraine without aura without status migrainosus, not intractable   . Hypokalemia   . Adjustment reaction with anxiety and depression   . Spinal cord injury, lumbar, without spinal bone injury, sequela ([place]) 08/18/2020  . Paraparesis ([place])   . Post-operative pain   . Hypotension   . Slow transit constipation   . AKI (acute kidney injury) ([place])   . Right leg weakness 08/11/2020  . DDD (degenerative disc disease), lumbar 09/02/2019    Class: Chronic  . Degenerative disc disease, lumbar 09/02/2019  . Chest tightness 09/23/2018  . Bradycardia 09/23/2018  . Labile blood pressure 03/08/2018  . Chronic low back pain 09/03/2017  . History of total hip replacement, left 01/26/2016  . EDEMA 04/28/2008  . LEG PAIN, BILATERAL 12/16/2007  . CERVICAL CANCER 07/02/2007  . MORBID OBESITY 07/02/2007  . DEPRESSION 07/02/2007  . COMMON MIGRAINE 07/02/2007  . ALLERGIC RHINITIS 07/02/2007  . ASTHMA 07/02/2007  . GERD 07/02/2007  . ELEVATED BLOOD PRESSURE WITHOUT DIAGNOSIS OF HYPERTENSION 07/02/2007   Ricard Dillon PT, DPT  11/02/2020, 5:12 PM  [place] MAIN [facility] SERVICES [address] [place], [place], [address] Phone: [phone]   Fax:  [phone]  Name: TOBI GROESBECK MRN: [id number] Date of Birth: 01-Sep-1971

## 2020-11-04 ENCOUNTER — Encounter: Payer: 59 | Admitting: Occupational Therapy

## 2020-11-04 ENCOUNTER — Ambulatory Visit: Payer: 59

## 2020-11-04 ENCOUNTER — Ambulatory Visit: Payer: 59 | Admitting: Specialist

## 2020-11-04 ENCOUNTER — Other Ambulatory Visit: Payer: Self-pay

## 2020-11-04 VITALS — BP 105/56 | HR 75

## 2020-11-04 DIAGNOSIS — R2689 Other abnormalities of gait and mobility: Secondary | ICD-10-CM

## 2020-11-04 DIAGNOSIS — R2681 Unsteadiness on feet: Secondary | ICD-10-CM | POA: Diagnosis not present

## 2020-11-04 DIAGNOSIS — M6281 Muscle weakness (generalized): Secondary | ICD-10-CM

## 2020-11-04 NOTE — Therapy (Signed)
[place] MAIN [facility] SERVICES [address] [place], [place], [address] Phone: [phone]   Fax:  [phone]  Physical Therapy Treatment  Patient Details  Name: Vanessa Medina MRN: [id number] Date of Birth: Jan 19, 1971 Referring Provider (PT): Dr. Erling Cruz   Encounter Date: 11/04/2020   PT End of Session - 11/04/20 1723    Visit Number 16    Number of Visits 53    Date for PT Re-Evaluation 01/20/21    Authorization Type eval: 89/37; prog note/recert 3/42/8768    PT Start Time 0930    PT Stop Time 1014    PT Time Calculation (min) 44 min    Equipment Utilized During Treatment Gait belt    Activity Tolerance Patient tolerated treatment well    Behavior During Therapy [facility] for tasks assessed/performed           Past Medical History:  Diagnosis Date  . Anemia   . Cervical cancer ([place])   . Family history of adverse reaction to anesthesia     " my mother takes a long time time wake up"  . GERD (gastroesophageal reflux disease)   . Headache    migraine  . History of shingles   . Hypertension   . Migraine   . Multiple allergies   . Obesity   . Osteoarthritis    left hip  . PONV (postoperative nausea and vomiting)   . Sleep apnea    does not wear CPAP  . Wears glasses     Past Surgical History:  Procedure Laterality Date  . ABDOMINAL HYSTERECTOMY    . APPENDECTOMY    . BILIOPANCREATIC DIVERSION     with duodenal switch laparoscopic   . CHOLECYSTECTOMY    . DIAGNOSTIC LAPAROSCOPY     LOA  . ESOPHAGOGASTRODUODENOSCOPY (EGD) WITH PROPOFOL N/A 07/25/2017   Procedure: ESOPHAGOGASTRODUODENOSCOPY (EGD) WITH PROPOFOL;  Surgeon: Manya Silvas, MD;  Location: [facility] ENDOSCOPY;  Service: Endoscopy;  Laterality: N/A;  . KNEE ARTHROSCOPY    . LAPAROSCOPIC GASTRIC SLEEVE RESECTION WITH HIATAL HERNIA REPAIR    . TONSILLECTOMY    . TOTAL HIP ARTHROPLASTY Left 01/26/2016   Procedure: LEFT TOTAL HIP ARTHROPLASTY ANTERIOR APPROACH;  Surgeon:  Leandrew Koyanagi, MD;  Location: [place];  Service: Orthopedics;  Laterality: Left;  . TRANSFORAMINAL LUMBAR INTERBODY FUSION (TLIF) WITH PEDICLE SCREW FIXATION 3 LEVEL  08/2019   Basil Dess, MD L2-L5    Vitals:   11/04/20 0930  BP: (!) 105/56  Pulse: 75     Subjective Assessment - 11/04/20 0925    Subjective Pt reports she did a lot of walking yesterday and states "it was about an hour worth of walking time." She states, "by the time I got home I was wiped out." She reports she had a difficult night due to pain. Pt continues to have fluttering sensation in her ears. She has upcoming appointment on the 22nd at [facility] ENT. Regarding pain, pt reports "right now I'm OK."    Pertinent History 50 year old female with history of HTN, migraines, morbid obesity--BMI-41, chronic hypotension, multiple back surgeries with chronic pain with LLE lumbar radiculopathy who was scheduled to have spinal cord stimulator placed by Dr. Davy Pique on 08/11/2020 but unable to perform surgery due to scar tissue.  History taken from patient and chart review.  Post op on awakening, she had excruciating pain RLE with plegia and and allodynia. She was admitted for work up by Dr. Ronnald Ramp from surgical center on  08/11/2020. Thoracic MRI done revealing subtle increased T2/STIR intensity distal cord at T12-L1 level suspicious for edema/acute spinal cord injury, no CSF leak as well as prior PLIF L2-L5 with residual foraminal protrusion L3/5 potentially irritating right L3 nerve. She was started on IV Dilaudid, gabapentin as well as IV Decadron for management of pain, headaches and severe neuropathy.  She continues to have pain in RLE but is having some motor return, has constant headache, decreased hearing in right> left ear, constipation with nausea as well as epigastric pain and weakness. Therapy ongoing and CIR recommended due to functional decline.  Of note, she reports 50 lbs weigh loss in the past 3-4 months--due to issues with  N/V/intake. Has had two falls last month---no recall of incident leading to fall question due to syncope. Was in process of work up for renal disease. She has had issue with LLE weakness with pain for the past year and being followed by Dr. Ernestina Patches. Did well with stimulator trial.    How long can you sit comfortably? 1 hour    How long can you stand comfortably? 8 mins    How long can you walk comfortably? 20-30 minutes    Currently in Pain? No/denies    Pain Onset More than a month ago   pt with chronic back pain from over a month ago but with acute pain as well due to recent fall at home         Treatment  LAQ with 3# AW 3x15; pt rates exercise "medium" for LLE and "hard" for RLE  Seated marches with 3# AW 1x20, 1x10  Seated physioball rollouts forward/back with 2-3 second hold at end range; Pt reports relief on back with exercise  Seated physioball rollouts to R and L sides with 2-3 sec hold at end range; Pt with decreased mobility to the R side compared to the L.  Seated adductor squeezes with ball 2x20  Sit<>stands with BUE assist off chair and SPC 1x10; Pt reports exercise feels "hard" due to fatigue. CGA provided.  Sit<>stand from modified height with UUE assist 1x5. CGA provided   Seated piriformis stretch 2x30 sec BLES  Added to HEP (demonstration, instruction, VC provided):  Access Code: [email or web address] URL: [email or web address] Date: 11/04/2020 Prepared by: Ricard Dillon  Exercises Seated Piriformis Stretch - 1 x daily - 7 x weekly - 2 sets - 1 reps - 30 hold    PT Education - 11/05/20 0830    Education Details seated piriformis stretch    Person(s) Educated Patient    Methods Explanation;Demonstration;Verbal cues;Handout    Comprehension Verbalized understanding;Returned demonstration            PT Short Term Goals - 10/29/20 8588      PT SHORT TERM GOAL #1   Title Pt will be independent with HEP in order to improve strength and balance in order to  decrease fall risk and improve function at home and work.    Baseline 10/14/2020 on-going/deferred; 10/28/2020 ongoing pt has resumed HEP, reports doing what she can/HEP to be advanced    Time 6    Period Weeks    Status On-going    Target Date 12/09/20             PT Long Term Goals - 10/29/20 5027      PT LONG TERM GOAL #1   Title Patient (< 70 years old) will complete five times sit to stand test in < 10 seconds indicating an increased LE strength and  improved balance.    Baseline 12/29: 38.45s; 10/14/2020 42.8 seconds; 10/28/2020 36 seconds    Time 12    Period Weeks    Status On-going    Target Date 01/20/21      PT LONG TERM GOAL #2   Title Pt will decrease TUG to below 14 seconds/decrease in order to demonstrate decreased fall risk.    Baseline 12/29: 32.94s; 10/14/2020 43.12 sec; 10/28/2020 34.12 seconds    Time 12    Period Weeks    Status On-going    Target Date 01/20/21      PT LONG TERM GOAL #3   Title Pt will decrease DHI score by at least 18 points in order to demonstrate clinically significant reduction in disability    Baseline 12/29: 30; 10/14/2020 62, indicating severe handicap; 10/28/2020 74    Time 12    Period Weeks    Status New    Target Date 01/20/21      PT LONG TERM GOAL #4   Title Patient will increase 10 meter walk test to >1.63m/s as to improve gait speed for better community ambulation and to reduce fall risk.    Baseline 12/29: 0.37 m/s; 0.38 m/s; 2/10/08/2020 0.31 m/s    Time 12    Period Weeks    Status On-going    Target Date 01/20/21      PT LONG TERM GOAL #5   Title Patient will increase FOTO score to equal to or greater than 64 to demonstrate statistically significant improvement in mobility and quality of life.    Baseline 12/29: 52; 10/14/2020 45; 10/28/2020 45    Time 12    Period Weeks    Status On-going    Target Date 01/20/21          Patient will benefit from skilled therapeutic intervention in order to improve the following  deficits and impairments:  Abnormal gait,Dizziness,Improper body mechanics,Decreased mobility,Cardiopulmonary status limiting activity,Decreased activity tolerance,Decreased endurance,Decreased range of motion,Decreased strength,Decreased balance,Decreased safety awareness,Difficulty walking,Impaired flexibility,Obesity  Visit Diagnosis: Muscle weakness (generalized)  Other abnormalities of gait and mobility   Problem List Patient Active Problem List   Diagnosis Date Noted  . Nerve pain 10/18/2020  . Incomplete paraplegia ([place]) 10/18/2020  . Orthostatic hypotension 10/18/2020  . Chronic migraine without aura without status migrainosus, not intractable   . Hypokalemia   . Adjustment reaction with anxiety and depression   . Spinal cord injury, lumbar, without spinal bone injury, sequela ([place]) 08/18/2020  . Paraparesis ([place])   . Post-operative pain   . Hypotension   . Slow transit constipation   . AKI (acute kidney injury) ([place])   . Right leg weakness 08/11/2020  . DDD (degenerative disc disease), lumbar 09/02/2019    Class: Chronic  . Degenerative disc disease, lumbar 09/02/2019  . Chest tightness 09/23/2018  . Bradycardia 09/23/2018  . Labile blood pressure 03/08/2018  . Chronic low back pain 09/03/2017  . History of total hip replacement, left 01/26/2016  . EDEMA 04/28/2008  . LEG PAIN, BILATERAL 12/16/2007  . CERVICAL CANCER 07/02/2007  . MORBID OBESITY 07/02/2007  . DEPRESSION 07/02/2007  . COMMON MIGRAINE 07/02/2007  . ALLERGIC RHINITIS 07/02/2007  . ASTHMA 07/02/2007  . GERD 07/02/2007  . ELEVATED BLOOD PRESSURE WITHOUT DIAGNOSIS OF HYPERTENSION 07/02/2007   Ricard Dillon PT, DPT 11/05/2020, 8:43 AM  [place] MAIN [facility] SERVICES [address] [place], [place], [address] Phone: [phone]   Fax:  [phone]  Name: Vanessa Medina  MRN: [id number] Date of Birth: 18-Feb-1971

## 2020-11-09 ENCOUNTER — Ambulatory Visit: Payer: 59

## 2020-11-09 ENCOUNTER — Encounter: Payer: 59 | Admitting: Occupational Therapy

## 2020-11-09 ENCOUNTER — Other Ambulatory Visit: Payer: Self-pay

## 2020-11-09 DIAGNOSIS — G8929 Other chronic pain: Secondary | ICD-10-CM

## 2020-11-09 DIAGNOSIS — R2689 Other abnormalities of gait and mobility: Secondary | ICD-10-CM

## 2020-11-09 DIAGNOSIS — M545 Low back pain, unspecified: Secondary | ICD-10-CM

## 2020-11-09 DIAGNOSIS — M6281 Muscle weakness (generalized): Secondary | ICD-10-CM

## 2020-11-09 DIAGNOSIS — R2681 Unsteadiness on feet: Secondary | ICD-10-CM | POA: Diagnosis not present

## 2020-11-09 NOTE — Therapy (Signed)
[place] MAIN [facility] SERVICES [place], [place], [address] Phone: [phone]   Fax:  [phone]  Physical Therapy Treatment  Patient Details  Name: Vanessa Medina MRN: [id number] Date of Birth: 1971-07-25 Referring Provider (PT): Dr. Erling Cruz   Encounter Date: 11/09/2020   PT End of Session - 11/09/20 1128    Visit Number 17    Number of Visits 53    Date for PT Re-Evaluation 01/20/21    Authorization Type eval: 09/73; prog note/recert 5/32/9924    PT Start Time 0926    PT Stop Time 1015    PT Time Calculation (min) 49 min    Equipment Utilized During Treatment Gait belt    Activity Tolerance Patient tolerated treatment well    Behavior During Therapy [facility] for tasks assessed/performed           Past Medical History:  Diagnosis Date  . Anemia   . Cervical cancer ([place])   . Family history of adverse reaction to anesthesia     " my mother takes a long time time wake up"  . GERD (gastroesophageal reflux disease)   . Headache    migraine  . History of shingles   . Hypertension   . Migraine   . Multiple allergies   . Obesity   . Osteoarthritis    left hip  . PONV (postoperative nausea and vomiting)   . Sleep apnea    does not wear CPAP  . Wears glasses     Past Surgical History:  Procedure Laterality Date  . ABDOMINAL HYSTERECTOMY    . APPENDECTOMY    . BILIOPANCREATIC DIVERSION     with duodenal switch laparoscopic   . CHOLECYSTECTOMY    . DIAGNOSTIC LAPAROSCOPY     LOA  . ESOPHAGOGASTRODUODENOSCOPY (EGD) WITH PROPOFOL N/A 07/25/2017   Procedure: ESOPHAGOGASTRODUODENOSCOPY (EGD) WITH PROPOFOL;  Surgeon: Manya Silvas, MD;  Location: [facility] ENDOSCOPY;  Service: Endoscopy;  Laterality: N/A;  . KNEE ARTHROSCOPY    . LAPAROSCOPIC GASTRIC SLEEVE RESECTION WITH HIATAL HERNIA REPAIR    . TONSILLECTOMY    . TOTAL HIP ARTHROPLASTY Left 01/26/2016   Procedure: LEFT TOTAL HIP ARTHROPLASTY ANTERIOR APPROACH;  Surgeon:  Leandrew Koyanagi, MD;  Location: [place];  Service: Orthopedics;  Laterality: Left;  . TRANSFORAMINAL LUMBAR INTERBODY FUSION (TLIF) WITH PEDICLE SCREW FIXATION 3 LEVEL  08/2019   Basil Dess, MD L2-L5    There were no vitals filed for this visit.  Treatment   Therapeutic Exercise:  LAQ with 2# AW 2x15; pt rates exercise "medium"  Initially attempted 3#s for a few reps, but pt reports quick onset of fatigue and decreased ROM.  Seated marches with 2# AW 2x15    Seated physioball rollouts forward/back 10x with 3 second hold at end range; Pt reports relief on back with exercise   Seated physioball rollouts to R and L sides 10x 3 second hold at end range; Pt with decreased mobility to the R side compared to the L.   Seated adductor squeezes with ball 1x20  Standing hip abduction with 2# AW 3x10 BLEs, BUE support on bar Seated rest break  Standing hip flexor stretch 2x30 sec each  Gait training:  Amb. 2 laps (appro 276 ft) with SPC,  CGA and VC for armswing, step-length Seated rest break Agility walking 10 meters in clinic gym 4x, CGA, emphasis on maintaining proper gait mechanics with improved gait speed Trial 1: 22.7 sec Trial 2: 24.3 sec  Trial 3: 22 sec Trail 4: 23 sec Seated rest break  Backward walking 2x10 meters, using push-chair, emphasis on increased hip extension to increase B step-length, CGA    PT Short Term Goals - 10/29/20 7482      PT SHORT TERM GOAL #1   Title Pt will be independent with HEP in order to improve strength and balance in order to decrease fall risk and improve function at home and work.    Baseline 10/14/2020 on-going/deferred; 10/28/2020 ongoing pt has resumed HEP, reports doing what she can/HEP to be advanced    Time 6    Period Weeks    Status On-going    Target Date 12/09/20             PT Long Term Goals - 10/29/20 7078      PT LONG TERM GOAL #1   Title Patient (< 51 years old) will complete five times sit to stand test in < 10 seconds  indicating an increased LE strength and improved balance.    Baseline 12/29: 38.45s; 10/14/2020 42.8 seconds; 10/28/2020 36 seconds    Time 12    Period Weeks    Status On-going    Target Date 01/20/21      PT LONG TERM GOAL #2   Title Pt will decrease TUG to below 14 seconds/decrease in order to demonstrate decreased fall risk.    Baseline 12/29: 32.94s; 10/14/2020 43.12 sec; 10/28/2020 34.12 seconds    Time 12    Period Weeks    Status On-going    Target Date 01/20/21      PT LONG TERM GOAL #3   Title Pt will decrease DHI score by at least 18 points in order to demonstrate clinically significant reduction in disability    Baseline 12/29: 30; 10/14/2020 62, indicating severe handicap; 10/28/2020 74    Time 12    Period Weeks    Status New    Target Date 01/20/21      PT LONG TERM GOAL #4   Title Patient will increase 10 meter walk test to >1.25m/s as to improve gait speed for better community ambulation and to reduce fall risk.    Baseline 12/29: 0.37 m/s; 0.38 m/s; 2/10/08/2020 0.31 m/s    Time 12    Period Weeks    Status On-going    Target Date 01/20/21      PT LONG TERM GOAL #5   Title Patient will increase FOTO score to equal to or greater than 64 to demonstrate statistically significant improvement in mobility and quality of life.    Baseline 12/29: 52; 10/14/2020 45; 10/28/2020 45    Time 12    Period Weeks    Status On-going    Target Date 01/20/21            Patient will benefit from skilled therapeutic intervention in order to improve the following deficits and impairments:  Abnormal gait,Dizziness,Improper body mechanics,Decreased mobility,Cardiopulmonary status limiting activity,Decreased activity tolerance,Decreased endurance,Decreased range of motion,Decreased strength,Decreased balance,Decreased safety awareness,Difficulty walking,Impaired flexibility,Obesity  Visit Diagnosis: Muscle weakness (generalized)  Other abnormalities of gait and mobility  Chronic  midline low back pain, unspecified whether sciatica present     Problem List Patient Active Problem List   Diagnosis Date Noted  . Nerve pain 10/18/2020  . Incomplete paraplegia ([place]) 10/18/2020  . Orthostatic hypotension 10/18/2020  . Chronic migraine without aura without status migrainosus, not intractable   . Hypokalemia   . Adjustment reaction with anxiety and depression   .  Spinal cord injury, lumbar, without spinal bone injury, sequela ([place]) 08/18/2020  . Paraparesis ([place])   . Post-operative pain   . Hypotension   . Slow transit constipation   . AKI (acute kidney injury) ([place])   . Right leg weakness 08/11/2020  . DDD (degenerative disc disease), lumbar 09/02/2019    Class: Chronic  . Degenerative disc disease, lumbar 09/02/2019  . Chest tightness 09/23/2018  . Bradycardia 09/23/2018  . Labile blood pressure 03/08/2018  . Chronic low back pain 09/03/2017  . History of total hip replacement, left 01/26/2016  . EDEMA 04/28/2008  . LEG PAIN, BILATERAL 12/16/2007  . CERVICAL CANCER 07/02/2007  . MORBID OBESITY 07/02/2007  . DEPRESSION 07/02/2007  . COMMON MIGRAINE 07/02/2007  . ALLERGIC RHINITIS 07/02/2007  . ASTHMA 07/02/2007  . GERD 07/02/2007  . ELEVATED BLOOD PRESSURE WITHOUT DIAGNOSIS OF HYPERTENSION 07/02/2007   Ricard Dillon PT, DPT  11/09/2020, 11:47 AM  [place] MAIN [facility] SERVICES [address] [place], [place], [address] Phone: [phone]   Fax:  [phone]  Name: Vanessa Medina MRN: [id number] Date of Birth: February 20, 1971

## 2020-11-11 ENCOUNTER — Other Ambulatory Visit: Payer: Self-pay

## 2020-11-11 ENCOUNTER — Ambulatory Visit: Payer: 59

## 2020-11-11 ENCOUNTER — Encounter: Payer: 59 | Admitting: Occupational Therapy

## 2020-11-11 DIAGNOSIS — M6281 Muscle weakness (generalized): Secondary | ICD-10-CM

## 2020-11-11 DIAGNOSIS — R2689 Other abnormalities of gait and mobility: Secondary | ICD-10-CM

## 2020-11-11 DIAGNOSIS — R2681 Unsteadiness on feet: Secondary | ICD-10-CM | POA: Diagnosis not present

## 2020-11-11 NOTE — Therapy (Signed)
[place] MAIN [facility] SERVICES [place], [place], [address] Phone: [phone]   Fax:  [phone]  Physical Therapy Treatment  Patient Details  Name: Vanessa Medina MRN: [id number] Date of Birth: 1971-05-27 Referring Provider (PT): Dr. Erling Cruz   Encounter Date: 11/11/2020   PT End of Session - 11/11/20 1010    Visit Number 18    Number of Visits 53    Date for PT Re-Evaluation 01/20/21    Authorization Type eval: 23/95; prog note/recert 11/21/2332    PT Start Time 0932    PT Stop Time 1015    PT Time Calculation (min) 43 min    Equipment Utilized During Treatment Gait belt    Activity Tolerance Patient tolerated treatment well;Patient limited by fatigue    Behavior During Therapy [facility] for tasks assessed/performed           Past Medical History:  Diagnosis Date  . Anemia   . Cervical cancer ([place])   . Family history of adverse reaction to anesthesia     " my mother takes a long time time wake up"  . GERD (gastroesophageal reflux disease)   . Headache    migraine  . History of shingles   . Hypertension   . Migraine   . Multiple allergies   . Obesity   . Osteoarthritis    left hip  . PONV (postoperative nausea and vomiting)   . Sleep apnea    does not wear CPAP  . Wears glasses     Past Surgical History:  Procedure Laterality Date  . ABDOMINAL HYSTERECTOMY    . APPENDECTOMY    . BILIOPANCREATIC DIVERSION     with duodenal switch laparoscopic   . CHOLECYSTECTOMY    . DIAGNOSTIC LAPAROSCOPY     LOA  . ESOPHAGOGASTRODUODENOSCOPY (EGD) WITH PROPOFOL N/A 07/25/2017   Procedure: ESOPHAGOGASTRODUODENOSCOPY (EGD) WITH PROPOFOL;  Surgeon: Manya Silvas, MD;  Location: [facility] ENDOSCOPY;  Service: Endoscopy;  Laterality: N/A;  . KNEE ARTHROSCOPY    . LAPAROSCOPIC GASTRIC SLEEVE RESECTION WITH HIATAL HERNIA REPAIR    . TONSILLECTOMY    . TOTAL HIP ARTHROPLASTY Left 01/26/2016   Procedure: LEFT TOTAL HIP ARTHROPLASTY  ANTERIOR APPROACH;  Surgeon: Leandrew Koyanagi, MD;  Location: [place];  Service: Orthopedics;  Laterality: Left;  . TRANSFORAMINAL LUMBAR INTERBODY FUSION (TLIF) WITH PEDICLE SCREW FIXATION 3 LEVEL  08/2019   Basil Dess, MD L2-L5    There were no vitals filed for this visit.   Subjective Assessment - 11/11/20 0935    Subjective Pt reports her back pain is 8/10 today. She said her dizziness has been bad and she was in recliner yesterday. She likes to use heat on her back at home. Pt reports she vacuumed yesterday and thinks it might have irritated her back.    Pertinent History 50 year old female with history of HTN, migraines, morbid obesity--BMI-41, chronic hypotension, multiple back surgeries with chronic pain with LLE lumbar radiculopathy who was scheduled to have spinal cord stimulator placed by Dr. Davy Pique on 08/11/2020 but unable to perform surgery due to scar tissue.  History taken from patient and chart review.  Post op on awakening, she had excruciating pain RLE with plegia and and allodynia. She was admitted for work up by Dr. Ronnald Ramp from surgical center on 08/11/2020. Thoracic MRI done revealing subtle increased T2/STIR intensity distal cord at T12-L1 level suspicious for edema/acute spinal cord injury, no CSF leak as well as prior PLIF  L2-L5 with residual foraminal protrusion L3/5 potentially irritating right L3 nerve. She was started on IV Dilaudid, gabapentin as well as IV Decadron for management of pain, headaches and severe neuropathy.  She continues to have pain in RLE but is having some motor return, has constant headache, decreased hearing in right> left ear, constipation with nausea as well as epigastric pain and weakness. Therapy ongoing and CIR recommended due to functional decline.  Of note, she reports 50 lbs weigh loss in the past 3-4 months--due to issues with N/V/intake. Has had two falls last month---no recall of incident leading to fall question due to syncope. Was in process of work up  for renal disease. She has had issue with LLE weakness with pain for the past year and being followed by Dr. Ernestina Patches. Did well with stimulator trial.    How long can you sit comfortably? 1 hour    How long can you stand comfortably? 8 mins    How long can you walk comfortably? 20-30 minutes    Currently in Pain? Yes    Pain Score 8     Pain Location Back    Pain Orientation Lower    Pain Onset More than a month ago   pt with chronic back pain from over a month ago but with acute pain as well due to recent fall at home          TREATMENT Moist heat to low back with pt seated while she performs therex x 20 min. Pt skin checked at 5 minutes and at 20 minutes when moist heat removed. Skin appears normal, no adverse reaction observed or reported.   LAQ 2# AW 1x20; Pt reports easy for LLE, RLE she rates medium LAQ with 2# AW RLE, 3# LLE 1x20 Seated marches 2# AW on RLE, 3# on LLE 1x10; discontinued d/t back pain increase Physioball forward/backward rollouts with 2-3 second isometric 1x12 Physioball lateral rollouts R and L x5 each direction Standing hip abduction 1x10 BLEs; pt reports increase in low back pain, so exercise discontinued  Seated heel raises with 2# AW 2x20; pt rates "medium"  Nustepl level 2 x SPM 60s at 3 minutes RPE 5/10, SPO2 99, HR 82 Nustepl level 2 x SPM 60s at 4 1/2 minutes RPE 5/10, SPO2 99, HR 85 Nustepl level 2 x SPM 60s at 8 minutes RPE 5/10, SPO2 95, HR 83 Nustepl level 2 x SPM 60s at 10 minutes RPE 5/10, SPO2 99, HR 83 Cuing to maintain SPM in 60s;   Assessment: Pt decreased activity tolerance today secondary to low back pain that pt reports was increased with home chores yesterday. Session focused on seated therex performed within pain-tolerance and cardiovascular training with NuStep, where her RPE remained around 5/10 for 10 minutes. Pt would benefit from further skilled therapy to improve pain, BLE strength and endurance, and functional capacity to improve QOL.    Education provided throughout session with VC/TC to facilitate proper technique at target joints and with correct muscular activation.     PT Short Term Goals - 10/29/20 3810      PT SHORT TERM GOAL #1   Title Pt will be independent with HEP in order to improve strength and balance in order to decrease fall risk and improve function at home and work.    Baseline 10/14/2020 on-going/deferred; 10/28/2020 ongoing pt has resumed HEP, reports doing what she can/HEP to be advanced    Time 6    Period Weeks    Status  On-going    Target Date 12/09/20             PT Long Term Goals - 10/29/20 2993      PT LONG TERM GOAL #1   Title Patient (< 16 years old) will complete five times sit to stand test in < 10 seconds indicating an increased LE strength and improved balance.    Baseline 12/29: 38.45s; 10/14/2020 42.8 seconds; 10/28/2020 36 seconds    Time 12    Period Weeks    Status On-going    Target Date 01/20/21      PT LONG TERM GOAL #2   Title Pt will decrease TUG to below 14 seconds/decrease in order to demonstrate decreased fall risk.    Baseline 12/29: 32.94s; 10/14/2020 43.12 sec; 10/28/2020 34.12 seconds    Time 12    Period Weeks    Status On-going    Target Date 01/20/21      PT LONG TERM GOAL #3   Title Pt will decrease DHI score by at least 18 points in order to demonstrate clinically significant reduction in disability    Baseline 12/29: 30; 10/14/2020 62, indicating severe handicap; 10/28/2020 74    Time 12    Period Weeks    Status New    Target Date 01/20/21      PT LONG TERM GOAL #4   Title Patient will increase 10 meter walk test to >1.37m/s as to improve gait speed for better community ambulation and to reduce fall risk.    Baseline 12/29: 0.37 m/s; 0.38 m/s; 2/10/08/2020 0.31 m/s    Time 12    Period Weeks    Status On-going    Target Date 01/20/21      PT LONG TERM GOAL #5   Title Patient will increase FOTO score to equal to or greater than 64 to demonstrate  statistically significant improvement in mobility and quality of life.    Baseline 12/29: 52; 10/14/2020 45; 10/28/2020 45    Time 12    Period Weeks    Status On-going    Target Date 01/20/21                 Plan - 11/11/20 1316    Clinical Impression Statement Pt decreased activity tolerance today secondary to low back pain that pt reports was increased with home chores yesterday. Session focused on seated therex performed within pain-tolerance and cardiovascular training with NuStep, where her RPE remained around 5/10 for 10 minutes. Pt would benefit from further skilled therapy to improve pain, BLE strength and endurance, and functional capacity to improve QOL.    Personal Factors and Comorbidities Comorbidity 3+;Time since onset of injury/illness/exacerbation    Comorbidities HYPOtension, paraparesis, SCI without spinal bone injury, DDD, cervical cancer, acute kidney injury, GERD, asthma, bradycardia, L total hip replacement, hypokalemia    Examination-Activity Limitations Squat;Lift;Stairs;Bend;Stand;Reach Overhead;Carry;Transfers    Examination-Participation Restrictions Cleaning;Laundry;Meal Prep;Community Activity;Driving;Occupation    Stability/Clinical Decision Making Evolving/Moderate complexity    Rehab Potential Fair    PT Frequency 3x / week    PT Duration 12 weeks    PT Treatment/Interventions ADLs/Self Care Home Management;Aquatic Therapy;Gait training;Stair training;Functional mobility training;Therapeutic activities;Therapeutic exercise;Balance training;Moist Heat;Manual techniques;Patient/family education;Neuromuscular re-education;Passive range of motion;Energy conservation;Vestibular;Joint Manipulations;Spinal Manipulations    PT Next Visit Plan LE stretching for pain modulation and improved hip mobility; trial treadmill walking    PT Home Exercise Plan PROGRESS note: add new HEP, initiate UBE bike for cardiovasc endurance, R ankle motor control/strength, focus on  STS  and ambulation    Consulted and Agree with Plan of Care Patient           Patient will benefit from skilled therapeutic intervention in order to improve the following deficits and impairments:  Abnormal gait,Dizziness,Improper body mechanics,Decreased mobility,Cardiopulmonary status limiting activity,Decreased activity tolerance,Decreased endurance,Decreased range of motion,Decreased strength,Decreased balance,Decreased safety awareness,Difficulty walking,Impaired flexibility,Obesity  Visit Diagnosis: Muscle weakness (generalized)  Other abnormalities of gait and mobility     Problem List Patient Active Problem List   Diagnosis Date Noted  . Nerve pain 10/18/2020  . Incomplete paraplegia ([place]) 10/18/2020  . Orthostatic hypotension 10/18/2020  . Chronic migraine without aura without status migrainosus, not intractable   . Hypokalemia   . Adjustment reaction with anxiety and depression   . Spinal cord injury, lumbar, without spinal bone injury, sequela ([place]) 08/18/2020  . Paraparesis ([place])   . Post-operative pain   . Hypotension   . Slow transit constipation   . AKI (acute kidney injury) ([place])   . Right leg weakness 08/11/2020  . DDD (degenerative disc disease), lumbar 09/02/2019    Class: Chronic  . Degenerative disc disease, lumbar 09/02/2019  . Chest tightness 09/23/2018  . Bradycardia 09/23/2018  . Labile blood pressure 03/08/2018  . Chronic low back pain 09/03/2017  . History of total hip replacement, left 01/26/2016  . EDEMA 04/28/2008  . LEG PAIN, BILATERAL 12/16/2007  . CERVICAL CANCER 07/02/2007  . MORBID OBESITY 07/02/2007  . DEPRESSION 07/02/2007  . COMMON MIGRAINE 07/02/2007  . ALLERGIC RHINITIS 07/02/2007  . ASTHMA 07/02/2007  . GERD 07/02/2007  . ELEVATED BLOOD PRESSURE WITHOUT DIAGNOSIS OF HYPERTENSION 07/02/2007   Ricard Dillon PT, DPT 11/11/2020, 1:17 PM  [place] MAIN [facility] SERVICES [address]  [place], [place], [address] Phone: [phone]   Fax:  [phone]  Name: Vanessa Medina MRN: [id number] Date of Birth: 09/16/1970

## 2020-11-16 ENCOUNTER — Encounter: Payer: 59 | Admitting: Occupational Therapy

## 2020-11-16 ENCOUNTER — Ambulatory Visit: Payer: 59

## 2020-11-16 ENCOUNTER — Other Ambulatory Visit: Payer: Self-pay

## 2020-11-16 DIAGNOSIS — R2681 Unsteadiness on feet: Secondary | ICD-10-CM

## 2020-11-16 DIAGNOSIS — M6281 Muscle weakness (generalized): Secondary | ICD-10-CM

## 2020-11-16 DIAGNOSIS — R2689 Other abnormalities of gait and mobility: Secondary | ICD-10-CM

## 2020-11-16 NOTE — Therapy (Signed)
[place] MAIN [facility] SERVICES [place], [place], [address] Phone: [phone]   Fax:  [phone]  Physical Therapy Treatment  Patient Details  Name: Vanessa Medina MRN: [id number] Date of Birth: 1971/01/21 Referring Provider (PT): Dr. Erling Cruz   Encounter Date: 11/16/2020   PT End of Session - 11/16/20 0924    Visit Number 19    Number of Visits 53    Date for PT Re-Evaluation 01/20/21    Authorization Type eval: 69/62; prog note/recert 9/52/8413    PT Start Time 0926    PT Stop Time 1014    PT Time Calculation (min) 48 min    Equipment Utilized During Treatment Gait belt    Activity Tolerance Patient tolerated treatment well;Patient limited by fatigue    Behavior During Therapy [facility] for tasks assessed/performed           Past Medical History:  Diagnosis Date  . Anemia   . Cervical cancer ([place])   . Family history of adverse reaction to anesthesia     " my mother takes a long time time wake up"  . GERD (gastroesophageal reflux disease)   . Headache    migraine  . History of shingles   . Hypertension   . Migraine   . Multiple allergies   . Obesity   . Osteoarthritis    left hip  . PONV (postoperative nausea and vomiting)   . Sleep apnea    does not wear CPAP  . Wears glasses     Past Surgical History:  Procedure Laterality Date  . ABDOMINAL HYSTERECTOMY    . APPENDECTOMY    . BILIOPANCREATIC DIVERSION     with duodenal switch laparoscopic   . CHOLECYSTECTOMY    . DIAGNOSTIC LAPAROSCOPY     LOA  . ESOPHAGOGASTRODUODENOSCOPY (EGD) WITH PROPOFOL N/A 07/25/2017   Procedure: ESOPHAGOGASTRODUODENOSCOPY (EGD) WITH PROPOFOL;  Surgeon: Manya Silvas, MD;  Location: [facility] ENDOSCOPY;  Service: Endoscopy;  Laterality: N/A;  . KNEE ARTHROSCOPY    . LAPAROSCOPIC GASTRIC SLEEVE RESECTION WITH HIATAL HERNIA REPAIR    . TONSILLECTOMY    . TOTAL HIP ARTHROPLASTY Left 01/26/2016   Procedure: LEFT TOTAL HIP ARTHROPLASTY  ANTERIOR APPROACH;  Surgeon: Leandrew Koyanagi, MD;  Location: [facility];  Service: Orthopedics;  Laterality: Left;  . TRANSFORAMINAL LUMBAR INTERBODY FUSION (TLIF) WITH PEDICLE SCREW FIXATION 3 LEVEL  08/2019   Basil Dess, MD L2-L5    There were no vitals filed for this visit.  TREATMENT  Therex:  Moist heat to low back with pt seated while she performs therex x 15 min. Pt skin with moist heat removed with skin WNL, no adverse reaction observed or reported.   Physioball forward/backward rollouts with 2-3 second isometric 1x10  Physioball lateral rollouts R and L x5 each direction with 2 sec isometric   Seated glute sets 10x   Seated core twists with YTB 2x10   Sit<>stand 1x10; pt rates exercise "medium" difficulty, pt with moderate use of BUEs to assist with exercise, followed by 1x6 with UUE assist from chair, pt rates exercise "hard."  Gait training: Gait training for step-length walking 20 ft track with SPC, CGA Trial 1: 18 steps Trial 2: 18 steps Trial 3: 17 steps Trial 4: 15 steps Trial 5: 14 steps Trial 6: 18 steps Trial 7: 17 steps  Trial 8: 14 steps Pt with minor decrease in postural stability with increasing step-length. Greater difficulty increasing LLE step than RLE step. Pt  with improved technique this session with heel strike, but still requires minor cuing.   Assessment: Pt progressed STS to use of unilateral UE assist, but fatigues quickly and rates exercise as "hard." Pt was also able to improve step-length within session with gait training exercise, but exhibits some difficulty increasing LLE step length without frequent cuing. She shows slight decrease in postural stability with increased step-length. Pt will benefit from further skilled therapy to improve BLE strength, gait, and balance to improve ease and safety with all functional mobility.   Education provided throughout session with VC/TC to facilitate proper technique at target joints and with correct muscular  activation.     PT Education - 11/16/20 1507    Education Details Technique with gait exercise, body mechanics    Person(s) Educated Patient    Methods Explanation;Demonstration;Verbal cues    Comprehension Verbalized understanding;Returned demonstration            PT Short Term Goals - 10/29/20 6720      PT SHORT TERM GOAL #1   Title Pt will be independent with HEP in order to improve strength and balance in order to decrease fall risk and improve function at home and work.    Baseline 10/14/2020 on-going/deferred; 10/28/2020 ongoing pt has resumed HEP, reports doing what she can/HEP to be advanced    Time 6    Period Weeks    Status On-going    Target Date 12/09/20             PT Long Term Goals - 10/29/20 9470      PT LONG TERM GOAL #1   Title Patient (< 54 years old) will complete five times sit to stand test in < 10 seconds indicating an increased LE strength and improved balance.    Baseline 12/29: 38.45s; 10/14/2020 42.8 seconds; 10/28/2020 36 seconds    Time 12    Period Weeks    Status On-going    Target Date 01/20/21      PT LONG TERM GOAL #2   Title Pt will decrease TUG to below 14 seconds/decrease in order to demonstrate decreased fall risk.    Baseline 12/29: 32.94s; 10/14/2020 43.12 sec; 10/28/2020 34.12 seconds    Time 12    Period Weeks    Status On-going    Target Date 01/20/21      PT LONG TERM GOAL #3   Title Pt will decrease DHI score by at least 18 points in order to demonstrate clinically significant reduction in disability    Baseline 12/29: 30; 10/14/2020 62, indicating severe handicap; 10/28/2020 74    Time 12    Period Weeks    Status New    Target Date 01/20/21      PT LONG TERM GOAL #4   Title Patient will increase 10 meter walk test to >1.43m/s as to improve gait speed for better community ambulation and to reduce fall risk.    Baseline 12/29: 0.37 m/s; 0.38 m/s; 2/10/08/2020 0.31 m/s    Time 12    Period Weeks    Status On-going     Target Date 01/20/21      PT LONG TERM GOAL #5   Title Patient will increase FOTO score to equal to or greater than 64 to demonstrate statistically significant improvement in mobility and quality of life.    Baseline 12/29: 52; 10/14/2020 45; 10/28/2020 45    Time 12    Period Weeks    Status On-going    Target  Date 01/20/21                 Plan - 11/16/20 1711    Clinical Impression Statement Pt progressed STS to use of unilateral UE assist, but fatigues quickly and rates exercise as "hard." Pt was also able to improve step-length within session with gait training exercise, but exhibits some difficulty increasing LLE step length without frequent cuing. She shows slight decrease in postural stability with increased step-length. Pt will benefit from further skilled therapy to improve BLE strength, gait, and balance to improve ease and safety with all functional mobility.    Personal Factors and Comorbidities Comorbidity 3+;Time since onset of injury/illness/exacerbation    Comorbidities HYPOtension, paraparesis, SCI without spinal bone injury, DDD, cervical cancer, acute kidney injury, GERD, asthma, bradycardia, L total hip replacement, hypokalemia    Examination-Activity Limitations Squat;Lift;Stairs;Bend;Stand;Reach Overhead;Carry;Transfers    Examination-Participation Restrictions Cleaning;Laundry;Meal Prep;Community Activity;Driving;Occupation    Stability/Clinical Decision Making Evolving/Moderate complexity    Rehab Potential Fair    PT Frequency 3x / week    PT Duration 12 weeks    PT Treatment/Interventions ADLs/Self Care Home Management;Aquatic Therapy;Gait training;Stair training;Functional mobility training;Therapeutic activities;Therapeutic exercise;Balance training;Moist Heat;Manual techniques;Patient/family education;Neuromuscular re-education;Passive range of motion;Energy conservation;Vestibular;Joint Manipulations;Spinal Manipulations    PT Next Visit Plan LE stretching  for pain modulation and improved hip mobility; trial treadmill walking, gait training    PT Home Exercise Plan PROGRESS note: add new HEP, initiate UBE bike for cardiovasc endurance, R ankle motor control/strength, focus on STS and ambulation    Consulted and Agree with Plan of Care Patient           Patient will benefit from skilled therapeutic intervention in order to improve the following deficits and impairments:  Abnormal gait,Dizziness,Improper body mechanics,Decreased mobility,Cardiopulmonary status limiting activity,Decreased activity tolerance,Decreased endurance,Decreased range of motion,Decreased strength,Decreased balance,Decreased safety awareness,Difficulty walking,Impaired flexibility,Obesity  Visit Diagnosis: Muscle weakness (generalized)  Unsteadiness on feet  Other abnormalities of gait and mobility     Problem List Patient Active Problem List   Diagnosis Date Noted  . Nerve pain 10/18/2020  . Incomplete paraplegia ([place]) 10/18/2020  . Orthostatic hypotension 10/18/2020  . Chronic migraine without aura without status migrainosus, not intractable   . Hypokalemia   . Adjustment reaction with anxiety and depression   . Spinal cord injury, lumbar, without spinal bone injury, sequela ([place]) 08/18/2020  . Paraparesis ([place])   . Post-operative pain   . Hypotension   . Slow transit constipation   . AKI (acute kidney injury) ([place])   . Right leg weakness 08/11/2020  . DDD (degenerative disc disease), lumbar 09/02/2019    Class: Chronic  . Degenerative disc disease, lumbar 09/02/2019  . Chest tightness 09/23/2018  . Bradycardia 09/23/2018  . Labile blood pressure 03/08/2018  . Chronic low back pain 09/03/2017  . History of total hip replacement, left 01/26/2016  . EDEMA 04/28/2008  . LEG PAIN, BILATERAL 12/16/2007  . CERVICAL CANCER 07/02/2007  . MORBID OBESITY 07/02/2007  . DEPRESSION 07/02/2007  . COMMON MIGRAINE 07/02/2007  . ALLERGIC RHINITIS 07/02/2007  .  ASTHMA 07/02/2007  . GERD 07/02/2007  . ELEVATED BLOOD PRESSURE WITHOUT DIAGNOSIS OF HYPERTENSION 07/02/2007   Ricard Dillon PT, DPT 11/16/2020, 5:13 PM  [place] MAIN [facility] SERVICES [address] [place], [place], [address] Phone: [phone]   Fax:  [phone]  Name: Vanessa Medina MRN: [id number] Date of Birth: 01-Mar-1971

## 2020-11-18 ENCOUNTER — Ambulatory Visit: Payer: 59

## 2020-11-18 ENCOUNTER — Ambulatory Visit: Payer: 59 | Admitting: Specialist

## 2020-11-18 ENCOUNTER — Other Ambulatory Visit: Payer: Self-pay

## 2020-11-18 ENCOUNTER — Encounter: Payer: 59 | Admitting: Occupational Therapy

## 2020-11-18 ENCOUNTER — Encounter: Payer: Self-pay | Admitting: Specialist

## 2020-11-18 VITALS — BP 101/66 | HR 71 | Ht 64.0 in | Wt 236.0 lb

## 2020-11-18 DIAGNOSIS — R2681 Unsteadiness on feet: Secondary | ICD-10-CM

## 2020-11-18 DIAGNOSIS — M47814 Spondylosis without myelopathy or radiculopathy, thoracic region: Secondary | ICD-10-CM

## 2020-11-18 DIAGNOSIS — R2689 Other abnormalities of gait and mobility: Secondary | ICD-10-CM

## 2020-11-18 DIAGNOSIS — F458 Other somatoform disorders: Secondary | ICD-10-CM

## 2020-11-18 DIAGNOSIS — M47816 Spondylosis without myelopathy or radiculopathy, lumbar region: Secondary | ICD-10-CM

## 2020-11-18 DIAGNOSIS — M5136 Other intervertebral disc degeneration, lumbar region: Secondary | ICD-10-CM

## 2020-11-18 DIAGNOSIS — M6281 Muscle weakness (generalized): Secondary | ICD-10-CM

## 2020-11-18 MED ORDER — DICLOFENAC SODIUM 1 % EX GEL: 4.0000 g | Freq: Four times a day (QID) | CUTANEOUS | 3 refills | Status: DC

## 2020-11-18 NOTE — Patient Instructions (Signed)
For pruritis try capsaicin oint or cream, it is over the counter and is used for neuropathic pain. Also biofreeze containing menthol is sometimes helpful. Voltaren gel for the right knee osteoarthritis may be helpful.  The main ways of treat osteoarthritis, that are found to be success. Weight loss helps to decrease pain. Exercise is important to maintaining cartilage and thickness and strengthening. NSAIDs like motrin, tylenol, alleve are meds decreasing the inflamation. Ice is okay  In afternoon and evening and hot shower in the am

## 2020-11-18 NOTE — Therapy (Signed)
[place] MAIN [facility] SERVICES [place], [place], [address] Phone: [phone]   Fax:  [phone]  Physical Therapy Treatment/ Physical Therapy Progress Note   Dates of reporting period  10/14/2020   to  11/18/2020   Patient Details  Name: Vanessa Medina MRN: [id number] Date of Birth: [birth year] Referring Provider (PT): Dr. Erling Cruz   Encounter Date: 11/18/2020   PT End of Session - 11/19/20 0803    Visit Number 20    Number of Visits 53    Date for PT Re-Evaluation 01/20/21    Authorization Type eval: 12/29; prog note performed 11/18/2020    PT Start Time 0933    PT Stop Time 1014    PT Time Calculation (min) 41 min    Equipment Utilized During Treatment Gait belt    Activity Tolerance Patient tolerated treatment well    Behavior During Therapy [facility] for tasks assessed/performed           Past Medical History:  Diagnosis Date  . Anemia   . Cervical cancer ([place])   . Family history of adverse reaction to anesthesia     " my mother takes a long time time wake up"  . GERD (gastroesophageal reflux disease)   . Headache    migraine  . History of shingles   . Hypertension   . Migraine   . Multiple allergies   . Obesity   . Osteoarthritis    left hip  . PONV (postoperative nausea and vomiting)   . Sleep apnea    does not wear CPAP  . Wears glasses     Past Surgical History:  Procedure Laterality Date  . ABDOMINAL HYSTERECTOMY    . APPENDECTOMY    . BILIOPANCREATIC DIVERSION     with duodenal switch laparoscopic   . CHOLECYSTECTOMY    . DIAGNOSTIC LAPAROSCOPY     LOA  . ESOPHAGOGASTRODUODENOSCOPY (EGD) WITH PROPOFOL N/A 07/25/2017   Procedure: ESOPHAGOGASTRODUODENOSCOPY (EGD) WITH PROPOFOL;  Surgeon: Manya Silvas, MD;  Location: [facility] ENDOSCOPY;  Service: Endoscopy;  Laterality: N/A;  . KNEE ARTHROSCOPY    . LAPAROSCOPIC GASTRIC SLEEVE RESECTION WITH HIATAL HERNIA REPAIR    . TONSILLECTOMY    . TOTAL HIP  ARTHROPLASTY Left 01/26/2016   Procedure: LEFT TOTAL HIP ARTHROPLASTY ANTERIOR APPROACH;  Surgeon: Leandrew Koyanagi, MD;  Location: [facility];  Service: Orthopedics;  Laterality: Left;  . TRANSFORAMINAL LUMBAR INTERBODY FUSION (TLIF) WITH PEDICLE SCREW FIXATION 3 LEVEL  08/2019   Basil Dess, MD L2-L5    There were no vitals filed for this visit.   Subjective Assessment - 11/18/20 0936    Subjective Pt reports she slept all day yesterday. She took her dad to the [place] and did a lot of walking and states "it was over a mile of walking." Pt reports she slept for 4 hours afterward. She is still having difficulties with her ears fluttering. Pt reports leg pain in her knees and feet. She has no pain at rest but pain when she moves. She reports poor sleep last night.    Pertinent History 50 year old female with history of HTN, migraines, morbid obesity--BMI-41, chronic hypotension, multiple back surgeries with chronic pain with LLE lumbar radiculopathy who was scheduled to have spinal cord stimulator placed by Dr. Davy Pique on 08/11/2020 but unable to perform surgery due to scar tissue.  History taken from patient and chart review.  Post op on awakening, she had excruciating pain RLE with plegia  and and allodynia. She was admitted for work up by Dr. Ronnald Ramp from surgical center on 08/11/2020. Thoracic MRI done revealing subtle increased T2/STIR intensity distal cord at T12-L1 level suspicious for edema/acute spinal cord injury, no CSF leak as well as prior PLIF L2-L5 with residual foraminal protrusion L3/5 potentially irritating right L3 nerve. She was started on IV Dilaudid, gabapentin as well as IV Decadron for management of pain, headaches and severe neuropathy.  She continues to have pain in RLE but is having some motor return, has constant headache, decreased hearing in right> left ear, constipation with nausea as well as epigastric pain and weakness. Therapy ongoing and CIR recommended due to functional decline.  Of note,  she reports 50 lbs weigh loss in the past 3-4 months--due to issues with N/V/intake. Has had two falls last month---no recall of incident leading to fall question due to syncope. Was in process of work up for renal disease. She has had issue with LLE weakness with pain for the past year and being followed by Dr. Ernestina Patches. Did well with stimulator trial.    How long can you sit comfortably? 1 hour    How long can you stand comfortably? 8 mins    How long can you walk comfortably? 20-30 minutes    Currently in Pain? Yes    Pain Location Knee    Pain Orientation Right;Left    Pain Onset More than a month ago   pt with chronic back pain from over a month ago but with acute pain as well due to recent fall at home         TREATMENT  Physioball rollouts forward/backward 10x with 2-3 sec hold at end range  Physioball rollouts side-to-side 10x with 2-3 sec hold at end range   Discussed HEP: Pt states "they're going," referring to exercises. Pt has had trouble doing HEP d/t fatigue.  FOTO: 45  5xSTS: 34.85 seconds; pt rates "medium" (improved)  10MWT: 0.34 m/s with SPC (improved)  TUG: 26.5 seconds (improved)  DHI: 74   Seated glute sets 10x   Seated glute sets with adductor squeeze with ball - 10x; Demonstration/VC provided for technique  Seated thoracic extension over chair to improve mobility and for pain modulation - 2x10; Demonstration/VC provided for technique   Assessment: Pt making gains on 5xSTS (34.85 sec), 10MWT (26.5 sec), and TUG (26.5 sec), indicating improved BLE strength/power, gait speed/ability, and slight decrease in fall risk. Pt FOTO score (45%) unchanged since previous assessment. Pt [place] (74) slightly worse, likely due to continued reports of dizziness and inner ear "flutering," where pt has ENT appointment next week. Patient's condition has the potential to improve in response to therapy. Maximum improvement is yet to be obtained. The anticipated improvement is  attainable and reasonable in a generally predictable time.  Patient reports she walked over a mile recently, indicating improved functional mobility. Pt will continue to benefit from further skilled therapy to improve gait speed/ability, BLE strength and power, and balance to improve safety and ease with ADLs.    Access Code: [email or web address] URL: [email or web address] Date: 11/18/2020 Prepared by: Ricard Dillon  Exercises Seated Hip Adduction Isometrics with Diona Foley - 1 x daily - 4 x weekly - 3 sets - 10 reps Seated Thoracic Lumbar Extension - 1 x daily - 4 x weekly - 3 sets - 10 reps Sit to Stand with Counter Support - 1 x daily - 4 x weekly - 3 sets - 10 reps     PT Education -  11/19/20 0802    Education Details Pt educated on performance on tests and measures, indications for therapy    Person(s) Educated Patient    Methods Explanation    Comprehension Verbalized understanding            PT Short Term Goals - 11/19/20 0807      PT SHORT TERM GOAL #1   Title Pt will be independent with HEP in order to improve strength and balance in order to decrease fall risk and improve function at home and work.    Baseline 10/14/2020 on-going/deferred; 10/28/2020 ongoing pt has resumed HEP, reports doing what she can/HEP to be advanced; 11/18/2020  Pt has had trouble doing HEP d/t fatigue.    Time 6    Period Weeks    Status On-going    Target Date 12/09/20             PT Long Term Goals - 11/18/20 0953      PT LONG TERM GOAL #1   Title Patient (< 55 years old) will complete five times sit to stand test in < 10 seconds indicating an increased LE strength and improved balance.    Baseline 12/29: 38.45s; 10/14/2020 42.8 seconds; 10/28/2020 36 seconds; 11/18/2020 34.84 seconds    Time 12    Period Weeks    Status On-going    Target Date 01/20/21      PT LONG TERM GOAL #2   Title Pt will decrease TUG to below 14 seconds/decrease in order to demonstrate decreased fall risk.     Baseline 12/29: 32.94s; 10/14/2020 43.12 sec; 10/28/2020 34.12 seconds; 11/18/2020 26.5 sec    Time 12    Period Weeks    Status On-going    Target Date 01/20/21      PT LONG TERM GOAL #3   Title Pt will decrease DHI score by at least 18 points in order to demonstrate clinically significant reduction in disability    Baseline 12/29: 30; 10/14/2020 62, indicating severe handicap; 10/28/2020 74    Time 12    Period Weeks    Status On-going    Target Date 01/20/21      PT LONG TERM GOAL #4   Title Patient will increase 10 meter walk test to >1.27m/s as to improve gait speed for better community ambulation and to reduce fall risk.    Baseline 12/29: 0.37 m/s; 0.38 m/s; 2/10/08/2020 0.31 m/s 11/18/2020 0.34 m/s with SPC    Time 12    Period Weeks    Status On-going    Target Date 01/20/21      PT LONG TERM GOAL #5   Title Patient will increase FOTO score to equal to or greater than 64 to demonstrate statistically significant improvement in mobility and quality of life.    Baseline 12/29: 52; 10/14/2020 45; 10/28/2020 45; 11/18/2020 45    Time 12    Period Weeks    Status On-going    Target Date 01/20/21             Plan - 11/19/20 0814    Clinical Impression Statement Pt making gains on 5xSTS (34.85 sec), 10MWT (26.5 sec), and TUG (26.5 sec), indicating improved BLE strength/power, gait speed/ability, and slight decrease in fall risk. Pt FOTO score (45%) unchanged since previous assessment. Pt [place] (74) slightly worse, likely due to continued reports of dizziness and inner ear "flutering," where pt has ENT appointment next week. Patient's condition has the potential to improve in response to therapy.  Maximum improvement is yet to be obtained. The anticipated improvement is attainable and reasonable in a generally predictable time.  Patient reports she walked over a mile recently, indicating improved functional mobility. Pt will continue to benefit from further skilled therapy to improve gait  speed/ability, BLE strength and power, and balance to improve safety and ease with ADLs.    Personal Factors and Comorbidities Comorbidity 3+;Time since onset of injury/illness/exacerbation    Comorbidities HYPOtension, paraparesis, SCI without spinal bone injury, DDD, cervical cancer, acute kidney injury, GERD, asthma, bradycardia, L total hip replacement, hypokalemia    Examination-Activity Limitations Squat;Lift;Stairs;Bend;Stand;Reach Overhead;Carry;Transfers    Examination-Participation Restrictions Cleaning;Laundry;Meal Prep;Community Activity;Driving;Occupation    Stability/Clinical Decision Making Evolving/Moderate complexity    Rehab Potential Fair    PT Frequency 3x / week    PT Duration 12 weeks    PT Treatment/Interventions ADLs/Self Care Home Management;Aquatic Therapy;Gait training;Stair training;Functional mobility training;Therapeutic activities;Therapeutic exercise;Balance training;Moist Heat;Manual techniques;Patient/family education;Neuromuscular re-education;Passive range of motion;Energy conservation;Vestibular;Joint Manipulations;Spinal Manipulations    PT Next Visit Plan LE stretching for pain modulation and improved hip mobility; trial treadmill walking, gait training; step-length/gait technique training    PT Home Exercise Plan PROGRESS note: add new HEP, initiate UBE bike for cardiovasc endurance, R ankle motor control/strength, focus on STS and ambulation; added 11/18/2020 (handout) hip adduction, thoracic lumbar extension and sit<>stand with counter support    Consulted and Agree with Plan of Care Patient           Patient will benefit from skilled therapeutic intervention in order to improve the following deficits and impairments:  Abnormal gait,Dizziness,Improper body mechanics,Decreased mobility,Cardiopulmonary status limiting activity,Decreased activity tolerance,Decreased endurance,Decreased range of motion,Decreased strength,Decreased balance,Decreased safety  awareness,Difficulty walking,Impaired flexibility,Obesity  Visit Diagnosis: Muscle weakness (generalized)  Unsteadiness on feet  Other abnormalities of gait and mobility     Problem List Patient Active Problem List   Diagnosis Date Noted  . Nerve pain 10/18/2020  . Incomplete paraplegia ([place]) 10/18/2020  . Orthostatic hypotension 10/18/2020  . Chronic migraine without aura without status migrainosus, not intractable   . Hypokalemia   . Adjustment reaction with anxiety and depression   . Spinal cord injury, lumbar, without spinal bone injury, sequela ([place]) 08/18/2020  . Paraparesis ([place])   . Post-operative pain   . Hypotension   . Slow transit constipation   . AKI (acute kidney injury) ([place])   . Right leg weakness 08/11/2020  . DDD (degenerative disc disease), lumbar 09/02/2019    Class: Chronic  . Degenerative disc disease, lumbar 09/02/2019  . Chest tightness 09/23/2018  . Bradycardia 09/23/2018  . Labile blood pressure 03/08/2018  . Chronic low back pain 09/03/2017  . History of total hip replacement, left 01/26/2016  . EDEMA 04/28/2008  . LEG PAIN, BILATERAL 12/16/2007  . CERVICAL CANCER 07/02/2007  . MORBID OBESITY 07/02/2007  . DEPRESSION 07/02/2007  . COMMON MIGRAINE 07/02/2007  . ALLERGIC RHINITIS 07/02/2007  . ASTHMA 07/02/2007  . GERD 07/02/2007  . ELEVATED BLOOD PRESSURE WITHOUT DIAGNOSIS OF HYPERTENSION 07/02/2007   Ricard Dillon PT, DPT 11/19/2020, 8:17 AM  [place] MAIN [facility] SERVICES [address] [place], [place], [address] Phone: [phone]   Fax:  [phone]  Name: Vanessa Medina MRN: [id number] Date of Birth: 25-Apr-1971

## 2020-11-18 NOTE — Progress Notes (Signed)
Office Visit Note   Patient: Vanessa Medina           Date of Birth: 1971/06/03           MRN: [id number] Visit Date: 11/18/2020              Requested by: Maryland Pink, MD [address] [place],  [place] [address] PCP: Maryland Pink, MD   Assessment & Plan: Visit Diagnoses:  1. Lumbar degenerative disc disease   2. Spondylosis of thoracic spine without myelopathy   3. Spondylosis without myelopathy or radiculopathy, lumbar region   4. Neurogenic pruritus     Plan: For pruritis try capsaicin oint or cream, it is over the counter and is used for neuropathic pain. Also biofreeze containing menthol is sometimes helpful. Voltaren gel for the right knee osteoarthritis may be helpful.  The main ways of treat osteoarthritis, that are found to be success. Weight loss helps to decrease pain. Exercise is important to maintaining cartilage and thickness and strengthening. NSAIDs like motrin, tylenol, alleve are meds decreasing the inflamation. Ice is okay  In afternoon and evening and hot shower in the am  Follow-Up Instructions: No follow-ups on file.   Orders:  No orders of the defined types were placed in this encounter.  No orders of the defined types were placed in this encounter.     Procedures: No procedures performed   Clinical Data: No additional findings.   Subjective: Chief Complaint  Patient presents with  . Lower Back - Pain    50 year old female with history of lumbar fusion with post op neurogenic pain, she under went a SCS placement with complication of Elliott injury. She has had pain in the right knee And recent gel injection of the knee did not help and she reports the steroid injection did not help either. There is pain with weight bearing on the right leg. She compensates with the  Left leg and has noted increased pain into the left knee or leg. Complains of numbness into the right foot plantar aspect and she has difficulty stepping  down of the right foot and there is itching of the right toes.    Review of Systems  Constitutional: Negative.   HENT: Negative.   Eyes: Negative.   Respiratory: Negative.   Cardiovascular: Negative.   Gastrointestinal: Negative.   Endocrine: Negative.   Genitourinary: Negative.   Musculoskeletal: Negative.   Skin: Negative.   Allergic/Immunologic: Negative.   Neurological: Negative.   Hematological: Negative.   Psychiatric/Behavioral: Negative.      Objective: Vital Signs: BP 101/66 (BP Location: Left Arm, Patient Position: Sitting)   Pulse 71   Ht 5\' 4"  (1.626 m)   Wt 236 lb (107 kg)   BMI 40.51 kg/m   Physical Exam Constitutional:      Appearance: She is well-developed.  HENT:     Head: Normocephalic and atraumatic.  Eyes:     Pupils: Pupils are equal, round, and reactive to light.  Pulmonary:     Effort: Pulmonary effort is normal.     Breath sounds: Normal breath sounds.  Abdominal:     General: Bowel sounds are normal.     Palpations: Abdomen is soft.  Musculoskeletal:     Cervical back: Normal range of motion and neck supple.     Lumbar back: Positive right straight leg raise test and positive left straight leg raise test.  Skin:    General: Skin is warm  and dry.  Neurological:     Mental Status: She is alert and oriented to person, place, and time.  Psychiatric:        Behavior: Behavior normal.        Thought Content: Thought content normal.        Judgment: Judgment normal.     Back Exam   Tenderness  The patient is experiencing tenderness in the lumbar.  Range of Motion  Extension: abnormal  Flexion: abnormal  Lateral bend right: abnormal  Lateral bend left: abnormal  Rotation right: abnormal  Rotation left: abnormal   Muscle Strength  Right Quadriceps:  4/5  Left Quadriceps:  4/5  Right Hamstrings:  4/5  Left Hamstrings:  4/5   Tests  Straight leg raise right: positive Straight leg raise left: positive  Reflexes  Patellar:  0/4 Achilles: 2/4  Comments:  Weak into both legs quadriceps and foot DF and hip flexion. Pain into the right knee with radiographic signs of lateral joint line DJD and P-F joint line changes. C/o itching discomfort into the right toes.      Specialty Comments:  No specialty comments available.  Imaging: No results found.   PMFS History: Patient Active Problem List   Diagnosis Date Noted  . DDD (degenerative disc disease), lumbar 09/02/2019    Priority: High    Class: Chronic  . Nerve pain 10/18/2020  . Incomplete paraplegia ([place]) 10/18/2020  . Orthostatic hypotension 10/18/2020  . Chronic migraine without aura without status migrainosus, not intractable   . Hypokalemia   . Adjustment reaction with anxiety and depression   . Spinal cord injury, lumbar, without spinal bone injury, sequela ([place]) 08/18/2020  . Paraparesis ([place])   . Post-operative pain   . Hypotension   . Slow transit constipation   . AKI (acute kidney injury) ([place])   . Right leg weakness 08/11/2020  . Degenerative disc disease, lumbar 09/02/2019  . Chest tightness 09/23/2018  . Bradycardia 09/23/2018  . Labile blood pressure 03/08/2018  . Chronic low back pain 09/03/2017  . History of total hip replacement, left 01/26/2016  . EDEMA 04/28/2008  . LEG PAIN, BILATERAL 12/16/2007  . CERVICAL CANCER 07/02/2007  . MORBID OBESITY 07/02/2007  . DEPRESSION 07/02/2007  . COMMON MIGRAINE 07/02/2007  . ALLERGIC RHINITIS 07/02/2007  . ASTHMA 07/02/2007  . GERD 07/02/2007  . ELEVATED BLOOD PRESSURE WITHOUT DIAGNOSIS OF HYPERTENSION 07/02/2007   Past Medical History:  Diagnosis Date  . Anemia   . Cervical cancer ([place])   . Family history of adverse reaction to anesthesia     " my mother takes a long time time wake up"  . GERD (gastroesophageal reflux disease)   . Headache    migraine  . History of shingles   . Hypertension   . Migraine   . Multiple allergies   . Obesity   . Osteoarthritis    left hip   . PONV (postoperative nausea and vomiting)   . Sleep apnea    does not wear CPAP  . Wears glasses     Family History  Problem Relation Age of Onset  . Renal Disease Mother   . Hypertension Mother   . Sudden Cardiac Death Mother   . Heart failure Mother   . Valvular heart disease Mother   . Heart disease Mother   . Stroke Brother   . Heart attack Brother 59  . Diabetes Other   . Breast cancer Cousin        paternal side  Past Surgical History:  Procedure Laterality Date  . ABDOMINAL HYSTERECTOMY    . APPENDECTOMY    . BILIOPANCREATIC DIVERSION     with duodenal switch laparoscopic   . CHOLECYSTECTOMY    . DIAGNOSTIC LAPAROSCOPY     LOA  . ESOPHAGOGASTRODUODENOSCOPY (EGD) WITH PROPOFOL N/A 07/25/2017   Procedure: ESOPHAGOGASTRODUODENOSCOPY (EGD) WITH PROPOFOL;  Surgeon: Manya Silvas, MD;  Location: [facility] ENDOSCOPY;  Service: Endoscopy;  Laterality: N/A;  . KNEE ARTHROSCOPY    . LAPAROSCOPIC GASTRIC SLEEVE RESECTION WITH HIATAL HERNIA REPAIR    . TONSILLECTOMY    . TOTAL HIP ARTHROPLASTY Left 01/26/2016   Procedure: LEFT TOTAL HIP ARTHROPLASTY ANTERIOR APPROACH;  Surgeon: Leandrew Koyanagi, MD;  Location: [facility];  Service: Orthopedics;  Laterality: Left;  . TRANSFORAMINAL LUMBAR INTERBODY FUSION (TLIF) WITH PEDICLE SCREW FIXATION 3 LEVEL  08/2019   Basil Dess, MD L2-L5   Social History   Occupational History  . Not on file  Tobacco Use  . Smoking status: Never Smoker  . Smokeless tobacco: Never Used  Vaping Use  . Vaping Use: Never used  Substance and Sexual Activity  . Alcohol use: Not Currently    Comment: occasional wine  . Drug use: No  . Sexual activity: Not Currently

## 2020-11-22 MED ORDER — BACLOFEN 10 MG PO TABS: 10.0000 mg | ORAL_TABLET | Freq: Three times a day (TID) | ORAL | 5 refills | Status: DC

## 2020-11-23 ENCOUNTER — Encounter: Payer: 59 | Admitting: Occupational Therapy

## 2020-11-23 ENCOUNTER — Other Ambulatory Visit: Payer: Self-pay

## 2020-11-23 ENCOUNTER — Ambulatory Visit: Payer: 59

## 2020-11-23 VITALS — BP 94/58 | HR 88

## 2020-11-23 DIAGNOSIS — M6281 Muscle weakness (generalized): Secondary | ICD-10-CM

## 2020-11-23 DIAGNOSIS — R2681 Unsteadiness on feet: Secondary | ICD-10-CM | POA: Diagnosis not present

## 2020-11-23 DIAGNOSIS — R2689 Other abnormalities of gait and mobility: Secondary | ICD-10-CM

## 2020-11-23 NOTE — Therapy (Signed)
[place] [facility] SERVICES [address] [place], [place], [address] Phone: [phone]   Fax:  [phone]  Physical Therapy Treatment  Patient Details  Name: Vanessa Medina MRN: [id number] Date of Birth: 05-Apr-1971 Referring Provider (PT): Dr. Erling Cruz   Encounter Date: 11/23/2020   PT End of Session - 11/23/20 1217    Visit Number 21    Number of Visits 53    Date for PT Re-Evaluation 01/20/21    Authorization Type eval: 12/29; prog note performed 11/18/2020    PT Start Time 0932    PT Stop Time 1015    PT Time Calculation (min) 43 min    Equipment Utilized During Treatment Gait belt    Activity Tolerance Patient tolerated treatment well    Behavior During Therapy [facility] for tasks assessed/performed           Past Medical History:  Diagnosis Date  . Anemia   . Cervical cancer ([place])   . Family history of adverse reaction to anesthesia     " my mother takes a long time time wake up"  . GERD (gastroesophageal reflux disease)   . Headache    migraine  . History of shingles   . Hypertension   . Migraine   . Multiple allergies   . Obesity   . Osteoarthritis    left hip  . PONV (postoperative nausea and vomiting)   . Sleep apnea    does not wear CPAP  . Wears glasses     Past Surgical History:  Procedure Laterality Date  . ABDOMINAL HYSTERECTOMY    . APPENDECTOMY    . BILIOPANCREATIC DIVERSION     with duodenal switch laparoscopic   . CHOLECYSTECTOMY    . DIAGNOSTIC LAPAROSCOPY     LOA  . ESOPHAGOGASTRODUODENOSCOPY (EGD) WITH PROPOFOL N/A 07/25/2017   Procedure: ESOPHAGOGASTRODUODENOSCOPY (EGD) WITH PROPOFOL;  Surgeon: Manya Silvas, MD;  Location: [facility] ENDOSCOPY;  Service: Endoscopy;  Laterality: N/A;  . KNEE ARTHROSCOPY    . LAPAROSCOPIC GASTRIC SLEEVE RESECTION WITH HIATAL HERNIA REPAIR    . TONSILLECTOMY    . TOTAL HIP ARTHROPLASTY Left 01/26/2016   Procedure: LEFT TOTAL HIP ARTHROPLASTY ANTERIOR APPROACH;   Surgeon: Leandrew Koyanagi, MD;  Location: [facility];  Service: Orthopedics;  Laterality: Left;  . TRANSFORAMINAL LUMBAR INTERBODY FUSION (TLIF) WITH PEDICLE SCREW FIXATION 3 LEVEL  08/2019   Basil Dess, MD L2-L5    Vitals:   11/23/20 0934 11/23/20 0937  BP: 109/66 (!) 94/58  Pulse: 79 88     Subjective Assessment - 11/23/20 0933    Subjective Pt reports fall on sunday at her home over a blanket. Pt reports she fell to the floor but did not hit her head. She says she is "sore." She reports she got up "too fast" this morning and "passed out." Pt reports no pain but that she is sore. She has follow-up appointment with her Dr next week where she will discuss her BP.    Pertinent History 50 year old female with history of HTN, migraines, morbid obesity--BMI-41, chronic hypotension, multiple back surgeries with chronic pain with LLE lumbar radiculopathy who was scheduled to have spinal cord stimulator placed by Dr. Davy Pique on 08/11/2020 but unable to perform surgery due to scar tissue.  History taken from patient and chart review.  Post op on awakening, she had excruciating pain RLE with plegia and and allodynia. She was admitted for work up by Dr. Ronnald Ramp from surgical center  on 08/11/2020. Thoracic MRI done revealing subtle increased T2/STIR intensity distal cord at T12-L1 level suspicious for edema/acute spinal cord injury, no CSF leak as well as prior PLIF L2-L5 with residual foraminal protrusion L3/5 potentially irritating right L3 nerve. She was started on IV Dilaudid, gabapentin as well as IV Decadron for management of pain, headaches and severe neuropathy.  She continues to have pain in RLE but is having some motor return, has constant headache, decreased hearing in right> left ear, constipation with nausea as well as epigastric pain and weakness. Therapy ongoing and CIR recommended due to functional decline.  Of note, she reports 50 lbs weigh loss in the past 3-4 months--due to issues with N/V/intake. Has had  two falls last month---no recall of incident leading to fall question due to syncope. Was in process of work up for renal disease. She has had issue with LLE weakness with pain for the past year and being followed by Dr. Ernestina Patches. Did well with stimulator trial.    How long can you sit comfortably? 1 hour    How long can you stand comfortably? 8 mins    How long can you walk comfortably? 20-30 minutes    Currently in Pain? No/denies    Pain Onset More than a month ago   pt with chronic back pain from over a month ago but with acute pain as well due to recent fall at home          TREATMENT  BP assessed at beginning and end of session (see Vital Signs and end of note)  Seated LAQ with 2.5# AW 3x15, VC for technique  Seated marches with 2.5# AW 1x20, brief demonstration for technique  Standing marches 2.5# AW 1x20, demonstration/VC for technique  Standing hamstring curls with 2.3# AW 3x15; demonstration/VC for technique   Sit<>stand 1x10, 1x5; use of BUE assist to stand; pt rates "medium," CGA provided.  Physioball forward/backward rollouts with 2-3 second isometric 1x10; VC for pt to use full pain-free ROM.  Physioball lateral rollouts R and L x5 each direction with 2 sec isometric; VC for pt to use full pain-free ROM.  BP following exercise:  108/69 HR 71    Hip ext/step-length training over approx 15 ft, SPC, CGA provided - x multiple trials where pt took from 7-12 steps/trial. VC for technique.  Education provided throughout primarily via VC/demonstration to promote movement at target joints and correct muscle activation with exercises.     PT Education - 11/23/20 1217    Education Details Pt educated step-length/hip ext. exericse, technique    Person(s) Educated Patient    Methods Explanation;Verbal cues    Comprehension Verbalized understanding;Returned demonstration            PT Short Term Goals - 11/19/20 0807      PT SHORT TERM GOAL #1   Title Pt will be  independent with HEP in order to improve strength and balance in order to decrease fall risk and improve function at home and work.    Baseline 10/14/2020 on-going/deferred; 10/28/2020 ongoing pt has resumed HEP, reports doing what she can/HEP to be advanced; 11/18/2020  Pt has had trouble doing HEP d/t fatigue.    Time 6    Period Weeks    Status On-going    Target Date 12/09/20             PT Long Term Goals - 11/18/20 0953      PT LONG TERM GOAL #1   Title Patient (<  56 years old) will complete five times sit to stand test in < 10 seconds indicating an increased LE strength and improved balance.    Baseline 12/29: 38.45s; 10/14/2020 42.8 seconds; 10/28/2020 36 seconds; 11/18/2020 34.84 seconds    Time 12    Period Weeks    Status On-going    Target Date 01/20/21      PT LONG TERM GOAL #2   Title Pt will decrease TUG to below 14 seconds/decrease in order to demonstrate decreased fall risk.    Baseline 12/29: 32.94s; 10/14/2020 43.12 sec; 10/28/2020 34.12 seconds; 11/18/2020 26.5 sec    Time 12    Period Weeks    Status On-going    Target Date 01/20/21      PT LONG TERM GOAL #3   Title Pt will decrease DHI score by at least 18 points in order to demonstrate clinically significant reduction in disability    Baseline 12/29: 30; 10/14/2020 62, indicating severe handicap; 10/28/2020 74    Time 12    Period Weeks    Status On-going    Target Date 01/20/21      PT LONG TERM GOAL #4   Title Patient will increase 10 meter walk test to >1.78m/s as to improve gait speed for better community ambulation and to reduce fall risk.    Baseline 12/29: 0.37 m/s; 0.38 m/s; 2/10/08/2020 0.31 m/s 11/18/2020 0.34 m/s with SPC    Time 12    Period Weeks    Status On-going    Target Date 01/20/21      PT LONG TERM GOAL #5   Title Patient will increase FOTO score to equal to or greater than 64 to demonstrate statistically significant improvement in mobility and quality of life.    Baseline 12/29: 52;  10/14/2020 45; 10/28/2020 45; 11/18/2020 45    Time 12    Period Weeks    Status On-going    Target Date 01/20/21                 Plan - 11/23/20 1222    Clinical Impression Statement PT took pt BP multiple times this session d/t pt reported fall over the weekend from "passing out," where pt reports no injuries but soreness. Pt did demonstrate drop in BP from seate>standing (see vitals). Pt was able to progress from seated>standing>walking therex today with only brief report of sx with first standing and no observed decrease in postural stability. Pt will continue to benefit from further skilled therapy to improve tolerance for upright activity and BLE strength to increase safety with all functional mobility.    Personal Factors and Comorbidities Comorbidity 3+;Time since onset of injury/illness/exacerbation    Comorbidities HYPOtension, paraparesis, SCI without spinal bone injury, DDD, cervical cancer, acute kidney injury, GERD, asthma, bradycardia, L total hip replacement, hypokalemia    Examination-Activity Limitations Squat;Lift;Stairs;Bend;Stand;Reach Overhead;Carry;Transfers    Examination-Participation Restrictions Cleaning;Laundry;Meal Prep;Community Activity;Driving;Occupation    Stability/Clinical Decision Making Evolving/Moderate complexity    Rehab Potential Fair    PT Frequency 3x / week    PT Duration 12 weeks    PT Treatment/Interventions ADLs/Self Care Home Management;Aquatic Therapy;Gait training;Stair training;Functional mobility training;Therapeutic activities;Therapeutic exercise;Balance training;Moist Heat;Manual techniques;Patient/family education;Neuromuscular re-education;Passive range of motion;Energy conservation;Vestibular;Joint Manipulations;Spinal Manipulations    PT Next Visit Plan LE stretching for pain modulation and improved hip mobility; trial treadmill walking, gait training; step-length/gait technique training; nustep for endurance    PT Home Exercise Plan  PROGRESS note: add new HEP, initiate UBE bike for cardiovasc endurance, R  ankle motor control/strength, focus on STS and ambulation; added 11/18/2020 (handout) hip adduction, thoracic lumbar extension and sit<>stand with counter support    Consulted and Agree with Plan of Care Patient           Patient will benefit from skilled therapeutic intervention in order to improve the following deficits and impairments:  Abnormal gait,Dizziness,Improper body mechanics,Decreased mobility,Cardiopulmonary status limiting activity,Decreased activity tolerance,Decreased endurance,Decreased range of motion,Decreased strength,Decreased balance,Decreased safety awareness,Difficulty walking,Impaired flexibility,Obesity  Visit Diagnosis: Muscle weakness (generalized)  Other abnormalities of gait and mobility  Unsteadiness on feet     Problem List Patient Active Problem List   Diagnosis Date Noted  . Nerve pain 10/18/2020  . Incomplete paraplegia ([place]) 10/18/2020  . Orthostatic hypotension 10/18/2020  . Chronic migraine without aura without status migrainosus, not intractable   . Hypokalemia   . Adjustment reaction with anxiety and depression   . Spinal cord injury, lumbar, without spinal bone injury, sequela ([place]) 08/18/2020  . Paraparesis ([place])   . Post-operative pain   . Hypotension   . Slow transit constipation   . AKI (acute kidney injury) ([place])   . Right leg weakness 08/11/2020  . DDD (degenerative disc disease), lumbar 09/02/2019    Class: Chronic  . Degenerative disc disease, lumbar 09/02/2019  . Chest tightness 09/23/2018  . Bradycardia 09/23/2018  . Labile blood pressure 03/08/2018  . Chronic low back pain 09/03/2017  . History of total hip replacement, left 01/26/2016  . EDEMA 04/28/2008  . LEG PAIN, BILATERAL 12/16/2007  . CERVICAL CANCER 07/02/2007  . MORBID OBESITY 07/02/2007  . DEPRESSION 07/02/2007  . COMMON MIGRAINE 07/02/2007  . ALLERGIC RHINITIS 07/02/2007  . ASTHMA  07/02/2007  . GERD 07/02/2007  . ELEVATED BLOOD PRESSURE WITHOUT DIAGNOSIS OF HYPERTENSION 07/02/2007   Ricard Dillon PT, DPT 11/23/2020, 12:25 PM  [place] [facility] SERVICES [address] [place], [place], [address] Phone: [phone]   Fax:  [phone]  Name: Vanessa Medina MRN: [id number] Date of Birth: [birth year]

## 2020-11-25 ENCOUNTER — Encounter: Payer: 59 | Admitting: Occupational Therapy

## 2020-11-25 ENCOUNTER — Other Ambulatory Visit: Payer: Self-pay

## 2020-11-25 ENCOUNTER — Ambulatory Visit: Payer: 59

## 2020-11-25 VITALS — BP 108/66 | HR 84

## 2020-11-25 DIAGNOSIS — R2689 Other abnormalities of gait and mobility: Secondary | ICD-10-CM

## 2020-11-25 DIAGNOSIS — R2681 Unsteadiness on feet: Secondary | ICD-10-CM | POA: Diagnosis not present

## 2020-11-25 DIAGNOSIS — M6281 Muscle weakness (generalized): Secondary | ICD-10-CM

## 2020-11-25 NOTE — Therapy (Signed)
[place] MAIN [facility] SERVICES [address] [place], [place], [address] Phone: [phone]   Fax:  [phone]  Physical Therapy Treatment  Patient Details  Name: Vanessa Medina MRN: [id number] Date of Birth: 03-Sep-1971 Referring Provider (PT): Dr. Erling Cruz   Encounter Date: 11/25/2020   PT End of Session - 11/25/20 1725    Visit Number 22    Number of Visits 53    Date for PT Re-Evaluation 01/20/21    Authorization Type eval: 12/29; prog note performed 11/18/2020    PT Start Time 0928    PT Stop Time 1014    PT Time Calculation (min) 46 min    Equipment Utilized During Treatment Gait belt    Activity Tolerance Patient tolerated treatment well    Behavior During Therapy [facility] for tasks assessed/performed           Past Medical History:  Diagnosis Date  . Anemia   . Cervical cancer ([place])   . Family history of adverse reaction to anesthesia     " my mother takes a long time time wake up"  . GERD (gastroesophageal reflux disease)   . Headache    migraine  . History of shingles   . Hypertension   . Migraine   . Multiple allergies   . Obesity   . Osteoarthritis    left hip  . PONV (postoperative nausea and vomiting)   . Sleep apnea    does not wear CPAP  . Wears glasses     Past Surgical History:  Procedure Laterality Date  . ABDOMINAL HYSTERECTOMY    . APPENDECTOMY    . BILIOPANCREATIC DIVERSION     with duodenal switch laparoscopic   . CHOLECYSTECTOMY    . DIAGNOSTIC LAPAROSCOPY     LOA  . ESOPHAGOGASTRODUODENOSCOPY (EGD) WITH PROPOFOL N/A 07/25/2017   Procedure: ESOPHAGOGASTRODUODENOSCOPY (EGD) WITH PROPOFOL;  Surgeon: Manya Silvas, MD;  Location: [facility] ENDOSCOPY;  Service: Endoscopy;  Laterality: N/A;  . KNEE ARTHROSCOPY    . LAPAROSCOPIC GASTRIC SLEEVE RESECTION WITH HIATAL HERNIA REPAIR    . TONSILLECTOMY    . TOTAL HIP ARTHROPLASTY Left 01/26/2016   Procedure: LEFT TOTAL HIP ARTHROPLASTY ANTERIOR APPROACH;   Surgeon: Leandrew Koyanagi, MD;  Location: [place];  Service: Orthopedics;  Laterality: Left;  . TRANSFORAMINAL LUMBAR INTERBODY FUSION (TLIF) WITH PEDICLE SCREW FIXATION 3 LEVEL  08/2019   Basil Dess, MD L2-L5    Vitals:   11/25/20 1727  BP: 108/66  Pulse: 84     Subjective Assessment - 11/25/20 0923    Subjective Pt reports ENT appointment determined she has eustachian tube dysfunction. Pt reports she is starting flonase and use of neti pot.  Pt says she has appointment to assess her vertigo. Pt reports feeling depressed about appoinment. Appointment with PCP as well on the 4th of April. Pt reports no falls or near falls since last visit.    Pertinent History 50 year old female with history of HTN, migraines, morbid obesity--BMI-41, chronic hypotension, multiple back surgeries with chronic pain with LLE lumbar radiculopathy who was scheduled to have spinal cord stimulator placed by Dr. Davy Pique on 08/11/2020 but unable to perform surgery due to scar tissue.  History taken from patient and chart review.  Post op on awakening, she had excruciating pain RLE with plegia and and allodynia. She was admitted for work up by Dr. Ronnald Ramp from surgical center on 08/11/2020. Thoracic MRI done revealing subtle increased T2/STIR intensity distal cord at  T12-L1 level suspicious for edema/acute spinal cord injury, no CSF leak as well as prior PLIF L2-L5 with residual foraminal protrusion L3/5 potentially irritating right L3 nerve. She was started on IV Dilaudid, gabapentin as well as IV Decadron for management of pain, headaches and severe neuropathy.  She continues to have pain in RLE but is having some motor return, has constant headache, decreased hearing in right> left ear, constipation with nausea as well as epigastric pain and weakness. Therapy ongoing and CIR recommended due to functional decline.  Of note, she reports 50 lbs weigh loss in the past 3-4 months--due to issues with N/V/intake. Has had two falls last  month---no recall of incident leading to fall question due to syncope. Was in process of work up for renal disease. She has had issue with LLE weakness with pain for the past year and being followed by Dr. Ernestina Patches. Did well with stimulator trial.    How long can you sit comfortably? 1 hour    How long can you stand comfortably? 8 mins    How long can you walk comfortably? 20-30 minutes    Currently in Pain? Yes    Pain Location Back    Pain Orientation Lower    Pain Onset More than a month ago   pt with chronic back pain from over a month ago but with acute pain as well due to recent fall at home         TREATMENT   BP, seated 108/66 HR 84 bpm at baseline  Exercise closely monitored for vitals, RPE, and to challenge pt functional capacity with VC to increase SPM Nustep level 2 x 2 min HR 84 bpm, RPE 1/10, SPM 50s Nustep level 3 x 3 min rates, RPE 2-3/10, HR 84 bpm, SPM 50s-60s Nustep level 4 x 3  SPM 50s-58 max, RPE 2-3/10, HR 88 bpm Level 1 x 1 min for cool down Pt reports no sx with exercise.   Pt ambulated from the nustep to a chair (approx. 15 ft) with CGA and SPC, and pt reports feeling dizzy/lightheaded. Pt feelings resolved after a couple minutes of seated rest.  Seated marches 2x20  LAQ with 2.3# AW 3x15  Standing hip abduction at transport chair 2x10 BLEs  Ambulation for hip extension/mobility and increased step length/gait speed over 10 + meters. SPC and CGA. No sx throughout.  Warm up lap 1x1  Trial 1: 33 steps Trial 2: 34 steps Trail 3: 31 steps  Education provided throughout primarily via VC/demonstration to promote movement at target joints and correct muscle activation with exercises.      PT Short Term Goals - 11/19/20 0807      PT SHORT TERM GOAL #1   Title Pt will be independent with HEP in order to improve strength and balance in order to decrease fall risk and improve function at home and work.    Baseline 10/14/2020 on-going/deferred; 10/28/2020 ongoing  pt has resumed HEP, reports doing what she can/HEP to be advanced; 11/18/2020  Pt has had trouble doing HEP d/t fatigue.    Time 6    Period Weeks    Status On-going    Target Date 12/09/20             PT Long Term Goals - 11/18/20 0953      PT LONG TERM GOAL #1   Title Patient (< 56 years old) will complete five times sit to stand test in < 10 seconds indicating an increased LE strength  and improved balance.    Baseline 12/29: 38.45s; 10/14/2020 42.8 seconds; 10/28/2020 36 seconds; 11/18/2020 34.84 seconds    Time 12    Period Weeks    Status On-going    Target Date 01/20/21      PT LONG TERM GOAL #2   Title Pt will decrease TUG to below 14 seconds/decrease in order to demonstrate decreased fall risk.    Baseline 12/29: 32.94s; 10/14/2020 43.12 sec; 10/28/2020 34.12 seconds; 11/18/2020 26.5 sec    Time 12    Period Weeks    Status On-going    Target Date 01/20/21      PT LONG TERM GOAL #3   Title Pt will decrease DHI score by at least 18 points in order to demonstrate clinically significant reduction in disability    Baseline 12/29: 30; 10/14/2020 62, indicating severe handicap; 10/28/2020 74    Time 12    Period Weeks    Status On-going    Target Date 01/20/21      PT LONG TERM GOAL #4   Title Patient will increase 10 meter walk test to >1.61m/s as to improve gait speed for better community ambulation and to reduce fall risk.    Baseline 12/29: 0.37 m/s; 0.38 m/s; 2/10/08/2020 0.31 m/s 11/18/2020 0.34 m/s with SPC    Time 12    Period Weeks    Status On-going    Target Date 01/20/21      PT LONG TERM GOAL #5   Title Patient will increase FOTO score to equal to or greater than 64 to demonstrate statistically significant improvement in mobility and quality of life.    Baseline 12/29: 52; 10/14/2020 45; 10/28/2020 45; 11/18/2020 45    Time 12    Period Weeks    Status On-going    Target Date 01/20/21           Plan - 11/25/20 1732    Clinical Impression Statement Pt with brief  sx of feeling dizzy/light headed this session with initial ambulation of approx 15 ft with SPC, CGA form nustep to chair. Sx resolved within a couple minutes of sitting, and pt without reports of these sx for remainder of exercises. Pt was able to tolerate step length/hip mobility walking exericses over 10 meters for 4 total trials. Pt will continue to benefit from further skilled therapy to improve functional capacity and BLE strength to increase ease with all ADLs.    Personal Factors and Comorbidities Comorbidity 3+;Time since onset of injury/illness/exacerbation    Comorbidities HYPOtension, paraparesis, SCI without spinal bone injury, DDD, cervical cancer, acute kidney injury, GERD, asthma, bradycardia, L total hip replacement, hypokalemia    Examination-Activity Limitations Squat;Lift;Stairs;Bend;Stand;Reach Overhead;Carry;Transfers    Examination-Participation Restrictions Cleaning;Laundry;Meal Prep;Community Activity;Driving;Occupation    Stability/Clinical Decision Making Evolving/Moderate complexity    Rehab Potential Fair    PT Frequency 3x / week    PT Duration 12 weeks    PT Treatment/Interventions ADLs/Self Care Home Management;Aquatic Therapy;Gait training;Stair training;Functional mobility training;Therapeutic activities;Therapeutic exercise;Balance training;Moist Heat;Manual techniques;Patient/family education;Neuromuscular re-education;Passive range of motion;Energy conservation;Vestibular;Joint Manipulations;Spinal Manipulations    PT Next Visit Plan LE stretching for pain modulation and improved hip mobility; trial treadmill walking, gait training; step-length/gait technique training; nustep for endurance; back mobility therex    PT Home Exercise Plan PROGRESS note: add new HEP, initiate UBE bike for cardiovasc endurance, R ankle motor control/strength, focus on STS and ambulation; added 11/18/2020 (handout) hip adduction, thoracic lumbar extension and sit<>stand with counter support     Consulted and  Agree with Plan of Care Patient           Patient will benefit from skilled therapeutic intervention in order to improve the following deficits and impairments:  Abnormal gait,Dizziness,Improper body mechanics,Decreased mobility,Cardiopulmonary status limiting activity,Decreased activity tolerance,Decreased endurance,Decreased range of motion,Decreased strength,Decreased balance,Decreased safety awareness,Difficulty walking,Impaired flexibility,Obesity  Visit Diagnosis: Muscle weakness (generalized)  Other abnormalities of gait and mobility  Unsteadiness on feet     Problem List Patient Active Problem List   Diagnosis Date Noted  . Nerve pain 10/18/2020  . Incomplete paraplegia ([place]) 10/18/2020  . Orthostatic hypotension 10/18/2020  . Chronic migraine without aura without status migrainosus, not intractable   . Hypokalemia   . Adjustment reaction with anxiety and depression   . Spinal cord injury, lumbar, without spinal bone injury, sequela ([place]) 08/18/2020  . Paraparesis ([place])   . Post-operative pain   . Hypotension   . Slow transit constipation   . AKI (acute kidney injury) ([place])   . Right leg weakness 08/11/2020  . DDD (degenerative disc disease), lumbar 09/02/2019    Class: Chronic  . Degenerative disc disease, lumbar 09/02/2019  . Chest tightness 09/23/2018  . Bradycardia 09/23/2018  . Labile blood pressure 03/08/2018  . Chronic low back pain 09/03/2017  . History of total hip replacement, left 01/26/2016  . EDEMA 04/28/2008  . LEG PAIN, BILATERAL 12/16/2007  . CERVICAL CANCER 07/02/2007  . MORBID OBESITY 07/02/2007  . DEPRESSION 07/02/2007  . COMMON MIGRAINE 07/02/2007  . ALLERGIC RHINITIS 07/02/2007  . ASTHMA 07/02/2007  . GERD 07/02/2007  . ELEVATED BLOOD PRESSURE WITHOUT DIAGNOSIS OF HYPERTENSION 07/02/2007   Ricard Dillon PT, DPT 11/25/2020, 5:35 PM  [place] MAIN [facility] SERVICES [address]  [place], [place], [address] Phone: [phone]   Fax:  [phone]  Name: Vanessa Medina MRN: [id number] Date of Birth: Mar 06, 1971

## 2020-11-29 ENCOUNTER — Encounter: Payer: Self-pay | Admitting: Physical Medicine and Rehabilitation

## 2020-11-29 ENCOUNTER — Encounter: Payer: 59 | Attending: Physical Medicine and Rehabilitation | Admitting: Physical Medicine and Rehabilitation

## 2020-11-29 ENCOUNTER — Other Ambulatory Visit: Payer: Self-pay

## 2020-11-29 VITALS — Ht 65.0 in | Wt 237.0 lb

## 2020-11-29 DIAGNOSIS — G8222 Paraplegia, incomplete: Secondary | ICD-10-CM | POA: Diagnosis not present

## 2020-11-29 DIAGNOSIS — M545 Low back pain, unspecified: Secondary | ICD-10-CM | POA: Insufficient documentation

## 2020-11-29 DIAGNOSIS — M792 Neuralgia and neuritis, unspecified: Secondary | ICD-10-CM | POA: Insufficient documentation

## 2020-11-29 DIAGNOSIS — G8929 Other chronic pain: Secondary | ICD-10-CM | POA: Insufficient documentation

## 2020-11-29 DIAGNOSIS — J301 Allergic rhinitis due to pollen: Secondary | ICD-10-CM | POA: Diagnosis present

## 2020-11-29 DIAGNOSIS — R29898 Other symptoms and signs involving the musculoskeletal system: Secondary | ICD-10-CM | POA: Insufficient documentation

## 2020-11-29 MED ORDER — MONTELUKAST SODIUM 10 MG PO TABS: 10.0000 mg | ORAL_TABLET | Freq: Every day | ORAL | 5 refills | Status: DC

## 2020-11-29 NOTE — Patient Instructions (Signed)
Pt is a 50 yr old female with paraparesis/incomplete paraplegia due to attempted spinal cord stimulator placement-  With chronic hx of orthostasis, BMI of 41 s/p gastric sleeve, Acute on chronic renal issues, here for f/u on SCI and orthostatic hypotension.    1. Has year round allergies- think Eustachian tube disorder could be made worse with this.   2. Suggest changing Claritin- to Zyrtec or Xyzal- Zyrtec can use generic.    3. If that doesn't work, suggest Singulair for allergies. Discussed this further- will try switching to Zyrtec- and if this doesn't work, I have sent in Singulair 10 mg QHS/nightly- for allergies.     4. Needs to see vestibular specialist- likely for otolith repositioning.   5.  Suggest G2/gatorade- 1x/day or so.  Just to get enough electrolytes.   6. Discussed- possible Celestone steroid injections of knees R>L- I use a 27 gauge needle when I do these injections- I have many patients they last 8-12 weeks in other patients.   7. Con't PT and start vestibular therapy as well.    8. F/U in 8 weeks- double visit-  9. Supposed to return to work on 12/06/20-  Is in a seated position - Strongly suggest returning to work PART TIME on that date 4 hours/day  4 days/week- preferably Monday/Tuesday and Thursday and Friday- off Wednesday so can recover as well. See if can work up to more hours. Will fill out paperwork stating this- so can still do therapies, and recover from prolonged sitting.

## 2020-11-29 NOTE — Progress Notes (Signed)
Subjective:    Patient ID: Vanessa Medina, female    DOB: 05/18/1971, 51 y.o.   MRN: [id number]  HPI Pt is a 50 yr old female with paraparesis/incomplete paraplegia due to attempted spinal cord stimulator placement-  With chronic hx of orthostasis, BMI of 41 s/p gastric sleeve, Acute on chronic renal issues, here for f/u on SCI and orthostatic hypotension.   Haven't seen Cards- did let her go back up on Florinef-  On Florinef 0.2 mg daily.  BP 117/73  BP still low- usually ~ 110s/60s- dropped a little this week.  Passed out Tuesday- was down in 68L systolic.  Like a minute or so.  Restarted increase in Florinef ~ 4 weeks ago.  Drinking a lot of water and therefore voiding a lot more as well.   Duloxetine- taking 60 mg nightly- doing well with that-5% improvement on current dose.   Still having nerve pain, however.    Wondering if ear fullness a side effect of medications. Like ears full of water- when talks, it's worse- ENT cannot see anything specific. Hearing test as well- thinks it's a Eustachian tube issue- seeing a vestibular specialist this week.  To help vertigo. Can hurt so bad, dizzy. When turns head, nauseated/dizzy- cannot move/has to sit in recliner. Started 1 month ago.     Bad year round allergies- uses Neti pot and flonase 2 sprays in each nostril- Claritin 1x/day.    Going to PT 2x/week- working on a lot of walking- and working legs- challenging her ot take bigger steps with R leg.  Wishes numbness/tingling and burning would go away.   Saw Dr Louanne Skye last week- weak knees.  Said needs knees replaced eventually.  She walked out- cannot take the talk of more surgery.  Offered to do more injections Gel nor steroid injections- worked-  Steroid only lasted a couple weeks.      Pain Inventory Average Pain 6 Pain Right Now 6 My pain is constant, sharp, burning, dull, stabbing, tingling and aching  In the last 24 hours, has pain interfered with the  following? General activity 8 Relation with others 8 Enjoyment of life 8 What TIME of day is your pain at its worst? morning , daytime, evening and night Sleep (in general) Poor  Pain is worse with: walking, bending, sitting, inactivity, standing and some activites Pain improves with: rest, heat/ice, therapy/exercise and medication Relief from Meds: 6  Family History  Problem Relation Age of Onset  . Renal Disease Mother   . Hypertension Mother   . Sudden Cardiac Death Mother   . Heart failure Mother   . Valvular heart disease Mother   . Heart disease Mother   . Stroke Brother   . Heart attack Brother 52  . Diabetes Other   . Breast cancer Cousin        paternal side   Social History   Socioeconomic History  . Marital status: Divorced    Spouse name: Not on file  . Number of children: 1  . Years of education: Not on file  . Highest education level: Not on file  Occupational History  . Not on file  Tobacco Use  . Smoking status: Never Smoker  . Smokeless tobacco: Never Used  Vaping Use  . Vaping Use: Never used  Substance and Sexual Activity  . Alcohol use: Not Currently    Comment: occasional wine  . Drug use: No  . Sexual activity: Not Currently  Other Topics Concern  .  Not on file  Social History Narrative  . Not on file   Social Determinants of Health   Financial Resource Strain: Not on file  Food Insecurity: Not on file  Transportation Needs: Not on file  Physical Activity: Not on file  Stress: Not on file  Social Connections: Not on file   Past Surgical History:  Procedure Laterality Date  . ABDOMINAL HYSTERECTOMY    . APPENDECTOMY    . BILIOPANCREATIC DIVERSION     with duodenal switch laparoscopic   . CHOLECYSTECTOMY    . DIAGNOSTIC LAPAROSCOPY     LOA  . ESOPHAGOGASTRODUODENOSCOPY (EGD) WITH PROPOFOL N/A 07/25/2017   Procedure: ESOPHAGOGASTRODUODENOSCOPY (EGD) WITH PROPOFOL;  Surgeon: Manya Silvas, MD;  Location: [facility] ENDOSCOPY;   Service: Endoscopy;  Laterality: N/A;  . KNEE ARTHROSCOPY    . LAPAROSCOPIC GASTRIC SLEEVE RESECTION WITH HIATAL HERNIA REPAIR    . TONSILLECTOMY    . TOTAL HIP ARTHROPLASTY Left 01/26/2016   Procedure: LEFT TOTAL HIP ARTHROPLASTY ANTERIOR APPROACH;  Surgeon: Leandrew Koyanagi, MD;  Location: [place];  Service: Orthopedics;  Laterality: Left;  . TRANSFORAMINAL LUMBAR INTERBODY FUSION (TLIF) WITH PEDICLE SCREW FIXATION 3 LEVEL  08/2019   Basil Dess, MD L2-L5   Past Surgical History:  Procedure Laterality Date  . ABDOMINAL HYSTERECTOMY    . APPENDECTOMY    . BILIOPANCREATIC DIVERSION     with duodenal switch laparoscopic   . CHOLECYSTECTOMY    . DIAGNOSTIC LAPAROSCOPY     LOA  . ESOPHAGOGASTRODUODENOSCOPY (EGD) WITH PROPOFOL N/A 07/25/2017   Procedure: ESOPHAGOGASTRODUODENOSCOPY (EGD) WITH PROPOFOL;  Surgeon: Manya Silvas, MD;  Location: [facility] ENDOSCOPY;  Service: Endoscopy;  Laterality: N/A;  . KNEE ARTHROSCOPY    . LAPAROSCOPIC GASTRIC SLEEVE RESECTION WITH HIATAL HERNIA REPAIR    . TONSILLECTOMY    . TOTAL HIP ARTHROPLASTY Left 01/26/2016   Procedure: LEFT TOTAL HIP ARTHROPLASTY ANTERIOR APPROACH;  Surgeon: Leandrew Koyanagi, MD;  Location: [place];  Service: Orthopedics;  Laterality: Left;  . TRANSFORAMINAL LUMBAR INTERBODY FUSION (TLIF) WITH PEDICLE SCREW FIXATION 3 LEVEL  08/2019   Basil Dess, MD L2-L5   Past Medical History:  Diagnosis Date  . Anemia   . Cervical cancer ([place])   . Family history of adverse reaction to anesthesia     " my mother takes a long time time wake up"  . GERD (gastroesophageal reflux disease)   . Headache    migraine  . History of shingles   . Hypertension   . Migraine   . Multiple allergies   . Obesity   . Osteoarthritis    left hip  . PONV (postoperative nausea and vomiting)   . Sleep apnea    does not wear CPAP  . Wears glasses    Ht 5\' 5"  (1.651 m)   Wt 237 lb (107.5 kg)   BMI 39.44 kg/m   Opioid Risk Score:   Fall Risk Score:   `1  Depression screen PHQ 2/9  Depression screen [facility] 2/9 10/18/2020 09/15/2020  Decreased Interest 0 0  Down, Depressed, Hopeless 0 0  PHQ - 2 Score 0 0  Altered sleeping - 0  Tired, decreased energy - 0  Change in appetite - 0  Feeling bad or failure about yourself  - 0  Trouble concentrating - 0  Moving slowly or fidgety/restless - 1  Suicidal thoughts - 0  PHQ-9 Score - 1  Difficult doing work/chores - Somewhat difficult  Some recent data might be hidden  Review of Systems  Constitutional: Negative.   HENT: Negative.   Eyes: Positive for photophobia and visual disturbance.  Respiratory: Negative.   Cardiovascular: Negative.   Gastrointestinal: Negative.   Endocrine: Negative.   Genitourinary: Negative.   Musculoskeletal: Positive for arthralgias, back pain, gait problem and myalgias.  Skin: Negative.   Allergic/Immunologic: Negative.   Neurological: Positive for dizziness, syncope, weakness, light-headedness and headaches.       Tingling  All other systems reviewed and are negative.      Objective:   Physical Exam Awake, alert, appropriate, using 4 knob cane , anxious, NAD Constantly sniffing! MS: LEs: RLE- HF 2+/5, KE/KF 3-/5, and DF 3-/5 and PF 3/5 Impaired by pain.  Neuro: Decreased from L4 to S2 on R No clonus- but did have some tremors of RLE       Assessment & Plan:    Pt is a 50 yr old female with paraparesis/incomplete paraplegia due to attempted spinal cord stimulator placement-  With chronic hx of orthostasis, BMI of 41 s/p gastric sleeve, Acute on chronic renal issues, here for f/u on SCI and orthostatic hypotension.    1. Has year round allergies- think Eustachian tube disorder could be made worse with this.   2. Suggest changing Claritin- to Zyrtec or Xyzal- Zyrtec can use generic.    3. If that doesn't work, suggest Singulair for allergies. Discussed this further- will try switching to Zyrtec- and if this doesn't work, I have sent in  Singulair 10 mg QHS/nightly- for allergies.     4. Needs to see vestibular specialist- likely for otolith repositioning.   5.  Suggest G2/gatorade- 1x/day or so.  Just to get enough electrolytes.   6. Discussed- possible Celestone steroid injections of knees R>L- I use a 27 gauge needle when I do these injections- I have many patients they last 8-12 weeks in other patients.   7. Con't PT and start vestibular therapy as well.    8. F/U in 8 weeks- double visit-has other meds- so no other refills.   9. Supposed to return to work on 12/06/20-  Is in a seated position - Strongly suggest returning to work PART TIME on that date 4 hours/day  4 days/week- preferably Monday/Tuesday and Thursday and Friday- off Wednesday so can recover as well. See if can work up to more hours. Will fill out paperwork stating this- so can still do therapies, and recover from prolonged sitting.   10. Also gave letter of return to work. As detailed above.   I spent a total of 30 minutes on visit- discussing ears, SCI as well as going back to work.

## 2020-11-30 ENCOUNTER — Ambulatory Visit: Payer: 59

## 2020-11-30 ENCOUNTER — Encounter: Payer: 59 | Admitting: Occupational Therapy

## 2020-11-30 DIAGNOSIS — R2681 Unsteadiness on feet: Secondary | ICD-10-CM | POA: Diagnosis not present

## 2020-11-30 DIAGNOSIS — M545 Low back pain, unspecified: Secondary | ICD-10-CM

## 2020-11-30 DIAGNOSIS — G8929 Other chronic pain: Secondary | ICD-10-CM

## 2020-11-30 DIAGNOSIS — R2689 Other abnormalities of gait and mobility: Secondary | ICD-10-CM

## 2020-11-30 DIAGNOSIS — M6281 Muscle weakness (generalized): Secondary | ICD-10-CM

## 2020-11-30 NOTE — Therapy (Signed)
[place] MAIN [facility] SERVICES [address] [place], [place], [address] Phone: [phone]   Fax:  [phone]  Physical Therapy Treatment  Patient Details  Name: Vanessa Medina MRN: [id number] Date of Birth: Dec 18, 1970 Referring Provider (PT): Dr. Erling Cruz   Encounter Date: 11/30/2020   PT End of Session - 11/30/20 1112    Visit Number 23    Number of Visits 53    Date for PT Re-Evaluation 01/20/21    Authorization Type eval: 12/29; prog note performed 11/18/2020    PT Start Time 0932    PT Stop Time 1015    PT Time Calculation (min) 43 min    Equipment Utilized During Treatment Gait belt    Activity Tolerance Patient tolerated treatment well    Behavior During Therapy [facility] for tasks assessed/performed           Past Medical History:  Diagnosis Date  . Anemia   . Cervical cancer ([place])   . Family history of adverse reaction to anesthesia     " my mother takes a long time time wake up"  . GERD (gastroesophageal reflux disease)   . Headache    migraine  . History of shingles   . Hypertension   . Migraine   . Multiple allergies   . Obesity   . Osteoarthritis    left hip  . PONV (postoperative nausea and vomiting)   . Sleep apnea    does not wear CPAP  . Wears glasses     Past Surgical History:  Procedure Laterality Date  . ABDOMINAL HYSTERECTOMY    . APPENDECTOMY    . BILIOPANCREATIC DIVERSION     with duodenal switch laparoscopic   . CHOLECYSTECTOMY    . DIAGNOSTIC LAPAROSCOPY     LOA  . ESOPHAGOGASTRODUODENOSCOPY (EGD) WITH PROPOFOL N/A 07/25/2017   Procedure: ESOPHAGOGASTRODUODENOSCOPY (EGD) WITH PROPOFOL;  Surgeon: Manya Silvas, MD;  Location: [facility] ENDOSCOPY;  Service: Endoscopy;  Laterality: N/A;  . KNEE ARTHROSCOPY    . LAPAROSCOPIC GASTRIC SLEEVE RESECTION WITH HIATAL HERNIA REPAIR    . TONSILLECTOMY    . TOTAL HIP ARTHROPLASTY Left 01/26/2016   Procedure: LEFT TOTAL HIP ARTHROPLASTY ANTERIOR APPROACH;   Surgeon: Leandrew Koyanagi, MD;  Location: [place];  Service: Orthopedics;  Laterality: Left;  . TRANSFORAMINAL LUMBAR INTERBODY FUSION (TLIF) WITH PEDICLE SCREW FIXATION 3 LEVEL  08/2019   Basil Dess, MD L2-L5    There were no vitals filed for this visit.   Subjective Assessment - 11/30/20 0931    Subjective Pt reports low back pain. Pt sees vestibular therapist tomorrow. Pt says her dr prescribed prescription strength allergy medication which pt is picking up after session today.    Pertinent History 50 year old female with history of HTN, migraines, morbid obesity--BMI-41, chronic hypotension, multiple back surgeries with chronic pain with LLE lumbar radiculopathy who was scheduled to have spinal cord stimulator placed by Dr. Davy Pique on 08/11/2020 but unable to perform surgery due to scar tissue.  History taken from patient and chart review.  Post op on awakening, she had excruciating pain RLE with plegia and and allodynia. She was admitted for work up by Dr. Ronnald Ramp from surgical center on 08/11/2020. Thoracic MRI done revealing subtle increased T2/STIR intensity distal cord at T12-L1 level suspicious for edema/acute spinal cord injury, no CSF leak as well as prior PLIF L2-L5 with residual foraminal protrusion L3/5 potentially irritating right L3 nerve. She was started on IV Dilaudid, gabapentin as  well as IV Decadron for management of pain, headaches and severe neuropathy.  She continues to have pain in RLE but is having some motor return, has constant headache, decreased hearing in right> left ear, constipation with nausea as well as epigastric pain and weakness. Therapy ongoing and CIR recommended due to functional decline.  Of note, she reports 50 lbs weigh loss in the past 3-4 months--due to issues with N/V/intake. Has had two falls last month---no recall of incident leading to fall question due to syncope. Was in process of work up for renal disease. She has had issue with LLE weakness with pain for the  past year and being followed by Dr. Ernestina Patches. Did well with stimulator trial.    How long can you sit comfortably? 1 hour    How long can you stand comfortably? 8 mins    How long can you walk comfortably? 20-30 minutes    Currently in Pain? Yes    Pain Score 7     Pain Location Back    Pain Orientation Lower    Pain Onset More than a month ago   pt with chronic back pain from over a month ago but with acute pain as well due to recent fall at home         TREATMENT  There: Moist heat placed to pt's low back while pt performed the following exercises in supine x20 min, skin was assessed once pack removed with no dverse reaction to treatment observed or reported  Supine adductor squeezes with ball 3x15; VC for technique  Lower trunk rotations 2x20 with hands on-assist. VC/TC for technique. Pt with significantly more rotation to L side. Limited to almost neutral position when trying to rotate to R due to pain limitations.  Instruction in TA contractions with posterior pelvic tilt x multiple reps; VC/demo for technique  TA contraction with deadbug marches 1x6, 1x10; VC/TC for technique.   Seated physioball rollouts 10x forward/back   Seated hysioball rollouts 10x side-to-side  Treadmill walking for endurance training, closely monitored for HR and RPE x 5 min up up to 1.0 mph with CGA throughout.  Pt RPE ranged from 2-4/10, HR range from 70s-90s.  No sx reported with exercise. Pt demonstrates good technique with heel strike B.  HEP added: Access Code: [email or web address] URL: [email or web address] Date: 11/30/2020 Prepared by: Ricard Dillon  Exercises Dead Bug - 1 x daily - 4 x weekly - 3 sets - 10 reps   Education provided throughout primarily via VC/demonstration to promote movement at target joints and correct muscle activation with exercises.      PT Education - 11/30/20 1112    Education Details technique, body mechanics with deadbugs    Person(s) Educated Patient     Methods Explanation;Tactile cues;Verbal cues;Handout    Comprehension Verbalized understanding;Returned demonstration            PT Short Term Goals - 11/19/20 0807      PT SHORT TERM GOAL #1   Title Pt will be independent with HEP in order to improve strength and balance in order to decrease fall risk and improve function at home and work.    Baseline 10/14/2020 on-going/deferred; 10/28/2020 ongoing pt has resumed HEP, reports doing what she can/HEP to be advanced; 11/18/2020  Pt has had trouble doing HEP d/t fatigue.    Time 6    Period Weeks    Status On-going    Target Date 12/09/20  PT Long Term Goals - 11/18/20 0953      PT LONG TERM GOAL #1   Title Patient (< 48 years old) will complete five times sit to stand test in < 10 seconds indicating an increased LE strength and improved balance.    Baseline 12/29: 38.45s; 10/14/2020 42.8 seconds; 10/28/2020 36 seconds; 11/18/2020 34.84 seconds    Time 12    Period Weeks    Status On-going    Target Date 01/20/21      PT LONG TERM GOAL #2   Title Pt will decrease TUG to below 14 seconds/decrease in order to demonstrate decreased fall risk.    Baseline 12/29: 32.94s; 10/14/2020 43.12 sec; 10/28/2020 34.12 seconds; 11/18/2020 26.5 sec    Time 12    Period Weeks    Status On-going    Target Date 01/20/21      PT LONG TERM GOAL #3   Title Pt will decrease DHI score by at least 18 points in order to demonstrate clinically significant reduction in disability    Baseline 12/29: 30; 10/14/2020 62, indicating severe handicap; 10/28/2020 74    Time 12    Period Weeks    Status On-going    Target Date 01/20/21      PT LONG TERM GOAL #4   Title Patient will increase 10 meter walk test to >1.73m/s as to improve gait speed for better community ambulation and to reduce fall risk.    Baseline 12/29: 0.37 m/s; 0.38 m/s; 2/10/08/2020 0.31 m/s 11/18/2020 0.34 m/s with SPC    Time 12    Period Weeks    Status On-going    Target Date  01/20/21      PT LONG TERM GOAL #5   Title Patient will increase FOTO score to equal to or greater than 64 to demonstrate statistically significant improvement in mobility and quality of life.    Baseline 12/29: 52; 10/14/2020 45; 10/28/2020 45; 11/18/2020 45    Time 12    Period Weeks    Status On-going    Target Date 01/20/21                 Plan - 11/30/20 1118    Clinical Impression Statement Pt presents with LBP of 7/10 today. PT focused majority of session on mobility exercises and pain modulation techniques for pt's low back. With lower trunk rotations pt with significant limitation to R side compared to L. Pt required VC for most exercises. Pt began instruction in TA contraction and deadbugs as well. Pt also started on treadmill today with response and vitals closely monitored. Pt had no sx with treadmill training. Pt will benefit from further skilled therapy to improve pain, mobility and LE endurance and strength to increase QOL.    Personal Factors and Comorbidities Comorbidity 3+;Time since onset of injury/illness/exacerbation    Comorbidities HYPOtension, paraparesis, SCI without spinal bone injury, DDD, cervical cancer, acute kidney injury, GERD, asthma, bradycardia, L total hip replacement, hypokalemia    Examination-Activity Limitations Squat;Lift;Stairs;Bend;Stand;Reach Overhead;Carry;Transfers    Examination-Participation Restrictions Cleaning;Laundry;Meal Prep;Community Activity;Driving;Occupation    Stability/Clinical Decision Making Evolving/Moderate complexity    Rehab Potential Fair    PT Frequency 3x / week    PT Duration 12 weeks    PT Treatment/Interventions ADLs/Self Care Home Management;Aquatic Therapy;Gait training;Stair training;Functional mobility training;Therapeutic activities;Therapeutic exercise;Balance training;Moist Heat;Manual techniques;Patient/family education;Neuromuscular re-education;Passive range of motion;Energy conservation;Vestibular;Joint  Manipulations;Spinal Manipulations    PT Next Visit Plan LE stretching for pain modulation and improved hip mobility; trial treadmill  walking, gait training; step-length/gait technique training; nustep for endurance; back mobility therex, progress treadmill endurance and gait training    PT Home Exercise Plan PROGRESS note: add new HEP, initiate UBE bike for cardiovasc endurance, R ankle motor control/strength, focus on STS and ambulation; added 11/18/2020 (handout) hip adduction, thoracic lumbar extension and sit<>stand with counter support    Consulted and Agree with Plan of Care Patient           Patient will benefit from skilled therapeutic intervention in order to improve the following deficits and impairments:  Abnormal gait,Dizziness,Improper body mechanics,Decreased mobility,Cardiopulmonary status limiting activity,Decreased activity tolerance,Decreased endurance,Decreased range of motion,Decreased strength,Decreased balance,Decreased safety awareness,Difficulty walking,Impaired flexibility,Obesity  Visit Diagnosis: Muscle weakness (generalized)  Other abnormalities of gait and mobility  Chronic midline low back pain, unspecified whether sciatica present     Problem List Patient Active Problem List   Diagnosis Date Noted  . Nerve pain 10/18/2020  . Incomplete paraplegia ([place]) 10/18/2020  . Orthostatic hypotension 10/18/2020  . Chronic migraine without aura without status migrainosus, not intractable   . Hypokalemia   . Adjustment reaction with anxiety and depression   . Spinal cord injury, lumbar, without spinal bone injury, sequela ([place]) 08/18/2020  . Paraparesis ([place])   . Post-operative pain   . Hypotension   . Slow transit constipation   . AKI (acute kidney injury) ([place])   . Right leg weakness 08/11/2020  . DDD (degenerative disc disease), lumbar 09/02/2019    Class: Chronic  . Degenerative disc disease, lumbar 09/02/2019  . Chest tightness 09/23/2018  . Bradycardia  09/23/2018  . Labile blood pressure 03/08/2018  . Chronic low back pain 09/03/2017  . History of total hip replacement, left 01/26/2016  . EDEMA 04/28/2008  . LEG PAIN, BILATERAL 12/16/2007  . CERVICAL CANCER 07/02/2007  . MORBID OBESITY 07/02/2007  . DEPRESSION 07/02/2007  . COMMON MIGRAINE 07/02/2007  . Allergic rhinitis 07/02/2007  . ASTHMA 07/02/2007  . GERD 07/02/2007  . ELEVATED BLOOD PRESSURE WITHOUT DIAGNOSIS OF HYPERTENSION 07/02/2007   Ricard Dillon PT, DPT 11/30/2020, 11:21 AM  [place] MAIN [facility] SERVICES [address] [place], [place], [address] Phone: [phone]   Fax:  [phone]  Name: Vanessa Medina MRN: [id number] Date of Birth: 1971/01/02

## 2020-12-02 ENCOUNTER — Encounter: Payer: 59 | Admitting: Occupational Therapy

## 2020-12-02 ENCOUNTER — Other Ambulatory Visit: Payer: Self-pay

## 2020-12-02 ENCOUNTER — Ambulatory Visit: Payer: 59

## 2020-12-02 DIAGNOSIS — G8929 Other chronic pain: Secondary | ICD-10-CM

## 2020-12-02 DIAGNOSIS — R2681 Unsteadiness on feet: Secondary | ICD-10-CM | POA: Diagnosis not present

## 2020-12-02 DIAGNOSIS — M545 Low back pain, unspecified: Secondary | ICD-10-CM

## 2020-12-02 DIAGNOSIS — M6281 Muscle weakness (generalized): Secondary | ICD-10-CM

## 2020-12-02 DIAGNOSIS — R2689 Other abnormalities of gait and mobility: Secondary | ICD-10-CM

## 2020-12-02 NOTE — Therapy (Signed)
[place] MAIN [facility] SERVICES [address] [place], [place], [address] Phone: [phone]   Fax:  [phone]  Physical Therapy Treatment  Patient Details  Name: Vanessa Medina MRN: [id number] Date of Birth: 1971/07/31 Referring Provider (PT): Dr. Erling Cruz   Encounter Date: 12/02/2020   PT End of Session - 12/02/20 1437    Visit Number 24    Number of Visits 53    Date for PT Re-Evaluation 01/20/21    Authorization Type eval: 12/29; prog note performed 11/18/2020    PT Start Time 0931    PT Stop Time 1014    PT Time Calculation (min) 43 min    Equipment Utilized During Treatment Gait belt    Activity Tolerance Patient tolerated treatment well    Behavior During Therapy [facility] for tasks assessed/performed           Past Medical History:  Diagnosis Date  . Anemia   . Cervical cancer ([place])   . Family history of adverse reaction to anesthesia     " my mother takes a long time time wake up"  . GERD (gastroesophageal reflux disease)   . Headache    migraine  . History of shingles   . Hypertension   . Migraine   . Multiple allergies   . Obesity   . Osteoarthritis    left hip  . PONV (postoperative nausea and vomiting)   . Sleep apnea    does not wear CPAP  . Wears glasses     Past Surgical History:  Procedure Laterality Date  . ABDOMINAL HYSTERECTOMY    . APPENDECTOMY    . BILIOPANCREATIC DIVERSION     with duodenal switch laparoscopic   . CHOLECYSTECTOMY    . DIAGNOSTIC LAPAROSCOPY     LOA  . ESOPHAGOGASTRODUODENOSCOPY (EGD) WITH PROPOFOL N/A 07/25/2017   Procedure: ESOPHAGOGASTRODUODENOSCOPY (EGD) WITH PROPOFOL;  Surgeon: Manya Silvas, MD;  Location: [facility] ENDOSCOPY;  Service: Endoscopy;  Laterality: N/A;  . KNEE ARTHROSCOPY    . LAPAROSCOPIC GASTRIC SLEEVE RESECTION WITH HIATAL HERNIA REPAIR    . TONSILLECTOMY    . TOTAL HIP ARTHROPLASTY Left 01/26/2016   Procedure: LEFT TOTAL HIP ARTHROPLASTY ANTERIOR APPROACH;   Surgeon: Leandrew Koyanagi, MD;  Location: [place];  Service: Orthopedics;  Laterality: Left;  . TRANSFORAMINAL LUMBAR INTERBODY FUSION (TLIF) WITH PEDICLE SCREW FIXATION 3 LEVEL  08/2019   Basil Dess, MD L2-L5    There were no vitals filed for this visit.   Subjective Assessment - 12/02/20 0929    Subjective Pt had vestibular appointment yesterday. Pt rates LBP pain today as 7/10. Pt RLE pain is 7/10. Per report from pt regarding visit for vestibular PT it is supsect her vertigo is of cervicogenic origin.    Pertinent History 50 year old female with history of HTN, migraines, morbid obesity--BMI-41, chronic hypotension, multiple back surgeries with chronic pain with LLE lumbar radiculopathy who was scheduled to have spinal cord stimulator placed by Dr. Davy Pique on 08/11/2020 but unable to perform surgery due to scar tissue.  History taken from patient and chart review.  Post op on awakening, she had excruciating pain RLE with plegia and and allodynia. She was admitted for work up by Dr. Ronnald Ramp from surgical center on 08/11/2020. Thoracic MRI done revealing subtle increased T2/STIR intensity distal cord at T12-L1 level suspicious for edema/acute spinal cord injury, no CSF leak as well as prior PLIF L2-L5 with residual foraminal protrusion L3/5 potentially irritating right L3 nerve.  She was started on IV Dilaudid, gabapentin as well as IV Decadron for management of pain, headaches and severe neuropathy.  She continues to have pain in RLE but is having some motor return, has constant headache, decreased hearing in right> left ear, constipation with nausea as well as epigastric pain and weakness. Therapy ongoing and CIR recommended due to functional decline.  Of note, she reports 50 lbs weigh loss in the past 3-4 months--due to issues with N/V/intake. Has had two falls last month---no recall of incident leading to fall question due to syncope. Was in process of work up for renal disease. She has had issue with LLE  weakness with pain for the past year and being followed by Dr. Ernestina Patches. Did well with stimulator trial.    How long can you sit comfortably? 1 hour    How long can you stand comfortably? 8 mins    How long can you walk comfortably? 20-30 minutes    Currently in Pain? Yes    Pain Score 7     Pain Location Back    Pain Onset More than a month ago   pt with chronic back pain from over a month ago but with acute pain as well due to recent fall at home         TREATMENT   Therex  Seated, moist heat to each shoulder for 15 min each throughout session as pt performed exercises. Pt skin checked at 5 min and once heat removed with no adverse reaction to treatment reported or observed.   Upper trap stretch B 2x30 sec each; pt feels more tension on L side  Levator scap stretch 2x30 sec each B; pt feels more tension on L side   Cervical deep flexor stretch 2x20 sec; pt reports some dizziness with exercise that resolves out of position after a minute  Cervical extensor stretch 2x20 sec  Seated physioball rollouts 10x forward/back   Seated physioball rollouts 10x side-to-side  Seated adductor squeezes with TA contraction and glute squeeze, cuing for breathing 2x10   Manual:  STM to B upper traps, letator scas, and cervical paraspinals x13 min  Education provided throughout primarily via VC/demonstration to promote movement at target joints and correct muscle activation with exercises.    PT Short Term Goals - 11/19/20 0807      PT SHORT TERM GOAL #1   Title Pt will be independent with HEP in order to improve strength and balance in order to decrease fall risk and improve function at home and work.    Baseline 10/14/2020 on-going/deferred; 10/28/2020 ongoing pt has resumed HEP, reports doing what she can/HEP to be advanced; 11/18/2020  Pt has had trouble doing HEP d/t fatigue.    Time 6    Period Weeks    Status On-going    Target Date 12/09/20             PT Long Term Goals -  11/18/20 0953      PT LONG TERM GOAL #1   Title Patient (< 78 years old) will complete five times sit to stand test in < 10 seconds indicating an increased LE strength and improved balance.    Baseline 12/29: 38.45s; 10/14/2020 42.8 seconds; 10/28/2020 36 seconds; 11/18/2020 34.84 seconds    Time 12    Period Weeks    Status On-going    Target Date 01/20/21      PT LONG TERM GOAL #2   Title Pt will decrease TUG to below 14  seconds/decrease in order to demonstrate decreased fall risk.    Baseline 12/29: 32.94s; 10/14/2020 43.12 sec; 10/28/2020 34.12 seconds; 11/18/2020 26.5 sec    Time 12    Period Weeks    Status On-going    Target Date 01/20/21      PT LONG TERM GOAL #3   Title Pt will decrease DHI score by at least 18 points in order to demonstrate clinically significant reduction in disability    Baseline 12/29: 30; 10/14/2020 62, indicating severe handicap; 10/28/2020 74    Time 12    Period Weeks    Status On-going    Target Date 01/20/21      PT LONG TERM GOAL #4   Title Patient will increase 10 meter walk test to >1.13m/s as to improve gait speed for better community ambulation and to reduce fall risk.    Baseline 12/29: 0.37 m/s; 0.38 m/s; 2/10/08/2020 0.31 m/s 11/18/2020 0.34 m/s with SPC    Time 12    Period Weeks    Status On-going    Target Date 01/20/21      PT LONG TERM GOAL #5   Title Patient will increase FOTO score to equal to or greater than 64 to demonstrate statistically significant improvement in mobility and quality of life.    Baseline 12/29: 52; 10/14/2020 45; 10/28/2020 45; 11/18/2020 45    Time 12    Period Weeks    Status On-going    Target Date 01/20/21            Plan - 12/02/20 1438    Clinical Impression Statement First part of PT session focused on modulation of pt pain sx and dizziness sx with cervical stretches and STM to B upper trap and cervical paraspinals. Pt TTP with trigger points B. Pt also reported improvement in sx following STM. Pt further  instructed in mobility exercises for LBP including TA activation with glute sets and adductor squeezes.  The pt will benefit from further skilled therapy to improve mobility, pain, and balance to increase QOL.    Personal Factors and Comorbidities Comorbidity 3+;Time since onset of injury/illness/exacerbation    Comorbidities HYPOtension, paraparesis, SCI without spinal bone injury, DDD, cervical cancer, acute kidney injury, GERD, asthma, bradycardia, L total hip replacement, hypokalemia    Examination-Activity Limitations Squat;Lift;Stairs;Bend;Stand;Reach Overhead;Carry;Transfers    Examination-Participation Restrictions Cleaning;Laundry;Meal Prep;Community Activity;Driving;Occupation    Stability/Clinical Decision Making Evolving/Moderate complexity    Rehab Potential Fair    PT Frequency 3x / week    PT Duration 12 weeks    PT Treatment/Interventions ADLs/Self Care Home Management;Aquatic Therapy;Gait training;Stair training;Functional mobility training;Therapeutic activities;Therapeutic exercise;Balance training;Moist Heat;Manual techniques;Patient/family education;Neuromuscular re-education;Passive range of motion;Energy conservation;Vestibular;Joint Manipulations;Spinal Manipulations    PT Next Visit Plan LE stretching for pain modulation and improved hip mobility; trial treadmill walking, gait training; step-length/gait technique training; nustep for endurance; back mobility therex, progress treadmill endurance and gait training; STM to cervical paraspinals and upper trap/levator scap    PT Home Exercise Plan PROGRESS note: add new HEP, initiate UBE bike for cardiovasc endurance, R ankle motor control/strength, focus on STS and ambulation; added 11/18/2020 (handout) hip adduction, thoracic lumbar extension and sit<>stand with counter support    Consulted and Agree with Plan of Care Patient           Patient will benefit from skilled therapeutic intervention in order to improve the following  deficits and impairments:  Abnormal gait,Dizziness,Improper body mechanics,Decreased mobility,Cardiopulmonary status limiting activity,Decreased activity tolerance,Decreased endurance,Decreased range of motion,Decreased strength,Decreased balance,Decreased safety  awareness,Difficulty walking,Impaired flexibility,Obesity  Visit Diagnosis: Muscle weakness (generalized)  Other abnormalities of gait and mobility  Chronic midline low back pain, unspecified whether sciatica present  Unsteadiness on feet     Problem List Patient Active Problem List   Diagnosis Date Noted  . Nerve pain 10/18/2020  . Incomplete paraplegia ([place]) 10/18/2020  . Orthostatic hypotension 10/18/2020  . Chronic migraine without aura without status migrainosus, not intractable   . Hypokalemia   . Adjustment reaction with anxiety and depression   . Spinal cord injury, lumbar, without spinal bone injury, sequela ([place]) 08/18/2020  . Paraparesis ([place])   . Post-operative pain   . Hypotension   . Slow transit constipation   . AKI (acute kidney injury) ([place])   . Right leg weakness 08/11/2020  . DDD (degenerative disc disease), lumbar 09/02/2019    Class: Chronic  . Degenerative disc disease, lumbar 09/02/2019  . Chest tightness 09/23/2018  . Bradycardia 09/23/2018  . Labile blood pressure 03/08/2018  . Chronic low back pain 09/03/2017  . History of total hip replacement, left 01/26/2016  . EDEMA 04/28/2008  . LEG PAIN, BILATERAL 12/16/2007  . CERVICAL CANCER 07/02/2007  . MORBID OBESITY 07/02/2007  . DEPRESSION 07/02/2007  . COMMON MIGRAINE 07/02/2007  . Allergic rhinitis 07/02/2007  . ASTHMA 07/02/2007  . GERD 07/02/2007  . ELEVATED BLOOD PRESSURE WITHOUT DIAGNOSIS OF HYPERTENSION 07/02/2007   Ricard Dillon PT, DPT 12/02/2020, 2:48 PM  [place] MAIN [facility] SERVICES [address] [place], [place], [address] Phone: [phone]   Fax:  [phone]  Name: Vanessa Medina MRN: [id number] Date of Birth: February 04, 1971

## 2020-12-07 ENCOUNTER — Other Ambulatory Visit: Payer: Self-pay

## 2020-12-07 ENCOUNTER — Ambulatory Visit: Payer: 59 | Attending: Physical Medicine and Rehabilitation

## 2020-12-07 DIAGNOSIS — S34109S Unspecified injury to unspecified level of lumbar spinal cord, sequela: Secondary | ICD-10-CM | POA: Diagnosis present

## 2020-12-07 DIAGNOSIS — G8929 Other chronic pain: Secondary | ICD-10-CM | POA: Insufficient documentation

## 2020-12-07 DIAGNOSIS — R262 Difficulty in walking, not elsewhere classified: Secondary | ICD-10-CM | POA: Diagnosis present

## 2020-12-07 DIAGNOSIS — R2681 Unsteadiness on feet: Secondary | ICD-10-CM | POA: Diagnosis present

## 2020-12-07 DIAGNOSIS — M6281 Muscle weakness (generalized): Secondary | ICD-10-CM

## 2020-12-07 DIAGNOSIS — M5442 Lumbago with sciatica, left side: Secondary | ICD-10-CM | POA: Insufficient documentation

## 2020-12-07 DIAGNOSIS — M545 Low back pain, unspecified: Secondary | ICD-10-CM | POA: Diagnosis present

## 2020-12-07 DIAGNOSIS — R296 Repeated falls: Secondary | ICD-10-CM | POA: Diagnosis present

## 2020-12-07 DIAGNOSIS — R2689 Other abnormalities of gait and mobility: Secondary | ICD-10-CM | POA: Diagnosis present

## 2020-12-07 DIAGNOSIS — R278 Other lack of coordination: Secondary | ICD-10-CM | POA: Insufficient documentation

## 2020-12-07 DIAGNOSIS — G903 Multi-system degeneration of the autonomic nervous system: Secondary | ICD-10-CM | POA: Diagnosis present

## 2020-12-07 DIAGNOSIS — M5441 Lumbago with sciatica, right side: Secondary | ICD-10-CM | POA: Diagnosis present

## 2020-12-07 NOTE — Therapy (Signed)
[place] MAIN [facility] SERVICES [place], [place], [address] Phone: [phone]   Fax:  [phone]  Physical Therapy Treatment  Patient Details  Name: Vanessa Medina MRN: [id number] Date of Birth: 04/23/1971 Referring Provider (PT): Dr. Erling Cruz   Encounter Date: 12/07/2020   PT End of Session - 12/08/20 0745    Visit Number 25    Number of Visits 53    Date for PT Re-Evaluation 01/20/21    Authorization Type eval: 12/29; prog note performed 11/18/2020    PT Start Time 1645    PT Stop Time 1729    PT Time Calculation (min) 44 min    Equipment Utilized During Treatment Gait belt    Activity Tolerance Patient tolerated treatment well    Behavior During Therapy [facility] for tasks assessed/performed           Past Medical History:  Diagnosis Date  . Anemia   . Cervical cancer ([place])   . Family history of adverse reaction to anesthesia     " my mother takes a long time time wake up"  . GERD (gastroesophageal reflux disease)   . Headache    migraine  . History of shingles   . Hypertension   . Migraine   . Multiple allergies   . Obesity   . Osteoarthritis    left hip  . PONV (postoperative nausea and vomiting)   . Sleep apnea    does not wear CPAP  . Wears glasses     Past Surgical History:  Procedure Laterality Date  . ABDOMINAL HYSTERECTOMY    . APPENDECTOMY    . BILIOPANCREATIC DIVERSION     with duodenal switch laparoscopic   . CHOLECYSTECTOMY    . DIAGNOSTIC LAPAROSCOPY     LOA  . ESOPHAGOGASTRODUODENOSCOPY (EGD) WITH PROPOFOL N/A 07/25/2017   Procedure: ESOPHAGOGASTRODUODENOSCOPY (EGD) WITH PROPOFOL;  Surgeon: Manya Silvas, MD;  Location: [facility] ENDOSCOPY;  Service: Endoscopy;  Laterality: N/A;  . KNEE ARTHROSCOPY    . LAPAROSCOPIC GASTRIC SLEEVE RESECTION WITH HIATAL HERNIA REPAIR    . TONSILLECTOMY    . TOTAL HIP ARTHROPLASTY Left 01/26/2016   Procedure: LEFT TOTAL HIP ARTHROPLASTY ANTERIOR APPROACH;   Surgeon: Leandrew Koyanagi, MD;  Location: [facility];  Service: Orthopedics;  Laterality: Left;  . TRANSFORAMINAL LUMBAR INTERBODY FUSION (TLIF) WITH PEDICLE SCREW FIXATION 3 LEVEL  08/2019   Basil Dess, MD L2-L5    There were no vitals filed for this visit.   Subjective Assessment - 12/08/20 0741    Subjective Patient reports her doctor appointment went well. Went to a vestibular therapist yesterday and is working on transferring her vestibular care to this center.    Pertinent History 50 year old female with history of HTN, migraines, morbid obesity--BMI-41, chronic hypotension, multiple back surgeries with chronic pain with LLE lumbar radiculopathy who was scheduled to have spinal cord stimulator placed by Dr. Davy Pique on 08/11/2020 but unable to perform surgery due to scar tissue.  History taken from patient and chart review.  Post op on awakening, she had excruciating pain RLE with plegia and and allodynia. She was admitted for work up by Dr. Ronnald Ramp from surgical center on 08/11/2020. Thoracic MRI done revealing subtle increased T2/STIR intensity distal cord at T12-L1 level suspicious for edema/acute spinal cord injury, no CSF leak as well as prior PLIF L2-L5 with residual foraminal protrusion L3/5 potentially irritating right L3 nerve. She was started on IV Dilaudid, gabapentin as well as IV  Decadron for management of pain, headaches and severe neuropathy.  She continues to have pain in RLE but is having some motor return, has constant headache, decreased hearing in right> left ear, constipation with nausea as well as epigastric pain and weakness. Therapy ongoing and CIR recommended due to functional decline.  Of note, she reports 50 lbs weigh loss in the past 3-4 months--due to issues with N/V/intake. Has had two falls last month---no recall of incident leading to fall question due to syncope. Was in process of work up for renal disease. She has had issue with LLE weakness with pain for the past year and being  followed by Dr. Ernestina Patches. Did well with stimulator trial.    How long can you sit comfortably? 1 hour    How long can you stand comfortably? 8 mins    How long can you walk comfortably? 20-30 minutes    Currently in Pain? Yes    Pain Score 6     Pain Location Back    Pain Orientation Lower    Pain Descriptors / Indicators Aching    Pain Type Chronic pain    Pain Onset More than a month ago   pt with chronic back pain from over a month ago but with acute pain as well due to recent fall at home   Pain Frequency Intermittent                  Therex Supine: with heat back on lower neck region and bolster under legs    cervical extension over rolled up towel under distal portion of neck 8x 5 second holds Scapular retraction and cervical chin tuck 10x 5 second holds  Adduction squeeze 10x Posterior pelvic tilt in hooklying 10x5 second holds SAQ 10x each LE  RTB ER 15x   Manual:  supine: use of heat pad under upper thoracic lower cervical region Cervical sidebend with gentle overpressure 4x45 second holds each direction  cervical rotation with gentle overpressure 4x 45 second holds each direction Suboccipital release 3x30 second holds  STM to B upper traps, letator scas, and cervical paraspinals x4 min Lateral cervical side bend with overpressure to upper    Education provided throughout primarily via VC/demonstration to promote movement at target joints and correct muscle activation with exercises.    Patient initially presents with high guarding due to pain that improves as session progresses. She tolerates strengthening interventions without low back pain increase. Prolonged holds allow for reduction of muscle tissue tension followed by postural strengthening interventions for improved quality of movement. The pt will benefit from further skilled therapy to improve mobility, pain, and balance to increase QOL.                 PT Education - 12/08/20 0744     Education Details exercise technique, body mechanics    Person(s) Educated Patient    Methods Explanation;Demonstration;Tactile cues;Verbal cues    Comprehension Verbalized understanding;Returned demonstration;Verbal cues required;Tactile cues required            PT Short Term Goals - 11/19/20 0807      PT SHORT TERM GOAL #1   Title Pt will be independent with HEP in order to improve strength and balance in order to decrease fall risk and improve function at home and work.    Baseline 10/14/2020 on-going/deferred; 10/28/2020 ongoing pt has resumed HEP, reports doing what she can/HEP to be advanced; 11/18/2020  Pt has had trouble doing HEP d/t fatigue.  Time 6    Period Weeks    Status On-going    Target Date 12/09/20             PT Long Term Goals - 11/18/20 0953      PT LONG TERM GOAL #1   Title Patient (< 57 years old) will complete five times sit to stand test in < 10 seconds indicating an increased LE strength and improved balance.    Baseline 12/29: 38.45s; 10/14/2020 42.8 seconds; 10/28/2020 36 seconds; 11/18/2020 34.84 seconds    Time 12    Period Weeks    Status On-going    Target Date 01/20/21      PT LONG TERM GOAL #2   Title Pt will decrease TUG to below 14 seconds/decrease in order to demonstrate decreased fall risk.    Baseline 12/29: 32.94s; 10/14/2020 43.12 sec; 10/28/2020 34.12 seconds; 11/18/2020 26.5 sec    Time 12    Period Weeks    Status On-going    Target Date 01/20/21      PT LONG TERM GOAL #3   Title Pt will decrease DHI score by at least 18 points in order to demonstrate clinically significant reduction in disability    Baseline 12/29: 30; 10/14/2020 62, indicating severe handicap; 10/28/2020 74    Time 12    Period Weeks    Status On-going    Target Date 01/20/21      PT LONG TERM GOAL #4   Title Patient will increase 10 meter walk test to >1.61m/s as to improve gait speed for better community ambulation and to reduce fall risk.    Baseline 12/29:  0.37 m/s; 0.38 m/s; 2/10/08/2020 0.31 m/s 11/18/2020 0.34 m/s with SPC    Time 12    Period Weeks    Status On-going    Target Date 01/20/21      PT LONG TERM GOAL #5   Title Patient will increase FOTO score to equal to or greater than 64 to demonstrate statistically significant improvement in mobility and quality of life.    Baseline 12/29: 52; 10/14/2020 45; 10/28/2020 45; 11/18/2020 45    Time 12    Period Weeks    Status On-going    Target Date 01/20/21                 Plan - 12/08/20 0746    Clinical Impression Statement Patient initially presents with high guarding due to pain that improves as session progresses. She tolerates strengthening interventions without low back pain increase. Prolonged holds allow for reduction of muscle tissue tension followed by postural strengthening interventions for improved quality of movement. The pt will benefit from further skilled therapy to improve mobility, pain, and balance to increase QOL.    Personal Factors and Comorbidities Comorbidity 3+;Time since onset of injury/illness/exacerbation    Comorbidities HYPOtension, paraparesis, SCI without spinal bone injury, DDD, cervical cancer, acute kidney injury, GERD, asthma, bradycardia, L total hip replacement, hypokalemia    Examination-Activity Limitations Squat;Lift;Stairs;Bend;Stand;Reach Overhead;Carry;Transfers    Examination-Participation Restrictions Cleaning;Laundry;Meal Prep;Community Activity;Driving;Occupation    Stability/Clinical Decision Making Evolving/Moderate complexity    Rehab Potential Fair    PT Frequency 3x / week    PT Duration 12 weeks    PT Treatment/Interventions ADLs/Self Care Home Management;Aquatic Therapy;Gait training;Stair training;Functional mobility training;Therapeutic activities;Therapeutic exercise;Balance training;Moist Heat;Manual techniques;Patient/family education;Neuromuscular re-education;Passive range of motion;Energy conservation;Vestibular;Joint  Manipulations;Spinal Manipulations    PT Next Visit Plan LE stretching for pain modulation and improved hip mobility; trial treadmill walking, gait  training; step-length/gait technique training; nustep for endurance; back mobility therex, progress treadmill endurance and gait training; STM to cervical paraspinals and upper trap/levator scap    PT Home Exercise Plan PROGRESS note: add new HEP, initiate UBE bike for cardiovasc endurance, R ankle motor control/strength, focus on STS and ambulation; added 11/18/2020 (handout) hip adduction, thoracic lumbar extension and sit<>stand with counter support    Consulted and Agree with Plan of Care Patient           Patient will benefit from skilled therapeutic intervention in order to improve the following deficits and impairments:  Abnormal gait,Dizziness,Improper body mechanics,Decreased mobility,Cardiopulmonary status limiting activity,Decreased activity tolerance,Decreased endurance,Decreased range of motion,Decreased strength,Decreased balance,Decreased safety awareness,Difficulty walking,Impaired flexibility,Obesity  Visit Diagnosis: Muscle weakness (generalized)  Other abnormalities of gait and mobility  Chronic midline low back pain, unspecified whether sciatica present  Unsteadiness on feet     Problem List Patient Active Problem List   Diagnosis Date Noted  . Nerve pain 10/18/2020  . Incomplete paraplegia ([place]) 10/18/2020  . Orthostatic hypotension 10/18/2020  . Chronic migraine without aura without status migrainosus, not intractable   . Hypokalemia   . Adjustment reaction with anxiety and depression   . Spinal cord injury, lumbar, without spinal bone injury, sequela ([place]) 08/18/2020  . Paraparesis ([place])   . Post-operative pain   . Hypotension   . Slow transit constipation   . AKI (acute kidney injury) ([place])   . Right leg weakness 08/11/2020  . DDD (degenerative disc disease), lumbar 09/02/2019    Class: Chronic  .  Degenerative disc disease, lumbar 09/02/2019  . Chest tightness 09/23/2018  . Bradycardia 09/23/2018  . Labile blood pressure 03/08/2018  . Chronic low back pain 09/03/2017  . History of total hip replacement, left 01/26/2016  . EDEMA 04/28/2008  . LEG PAIN, BILATERAL 12/16/2007  . CERVICAL CANCER 07/02/2007  . MORBID OBESITY 07/02/2007  . DEPRESSION 07/02/2007  . COMMON MIGRAINE 07/02/2007  . Allergic rhinitis 07/02/2007  . ASTHMA 07/02/2007  . GERD 07/02/2007  . ELEVATED BLOOD PRESSURE WITHOUT DIAGNOSIS OF HYPERTENSION 07/02/2007    Janna Arch, PT, DPT    12/08/2020, 7:48 AM  [place] MAIN [place] SERVICES [address] [place], [place], [address] Phone: [phone]   Fax:  [phone]  Name: Vanessa Medina MRN: [id number] Date of Birth: 1971/08/26

## 2020-12-09 ENCOUNTER — Ambulatory Visit: Payer: 59

## 2020-12-09 ENCOUNTER — Other Ambulatory Visit: Payer: Self-pay

## 2020-12-09 ENCOUNTER — Encounter: Payer: Self-pay | Admitting: Student

## 2020-12-09 ENCOUNTER — Ambulatory Visit: Payer: 59 | Admitting: Student

## 2020-12-09 ENCOUNTER — Ambulatory Visit: Payer: 59 | Admitting: Cardiology

## 2020-12-09 VITALS — BP 100/56 | HR 71 | Temp 98.2°F | Ht 65.0 in | Wt 240.0 lb

## 2020-12-09 DIAGNOSIS — G903 Multi-system degeneration of the autonomic nervous system: Secondary | ICD-10-CM

## 2020-12-09 NOTE — Progress Notes (Signed)
Primary Physician/Referring:  [place] Pink, MD  Patient ID: Vanessa Medina, female    DOB: [birth year], 50 y.o.   MRN: [id number]  Chief Complaint  Patient presents with  . Orthostatic Hypotension  . Follow-up   HPI:    Vanessa Medina  is a 50 y.o. with chronic dizziness, morbid obesity, history of gastric sleeve procedure in 2015 and revision in 2019. Previously seen by Dr. Ida Rogue, and originally referred to our office for evaluation of postural orthostatic tachycardia syndrome, however review of her record shows patient does not have POTS.  Rather patient only has tachycardia component of POTS.    Patient symptoms of orthostasis started approximately 5 years ago and may be related to underlying nutritional deficiency following bariatric procedure, polypharmacy as well.   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    Patient presents for 57-month follow-up of orthostatic hypotension.  At last visit patient was started on high doses of B12, B6, and B1 vitamins as well as Florinef was reduced from 0.2 mg 3 times daily to once daily in the evening.  However since last visit patient has been following closely with Dr. Dagoberto Ligas and with decreased dosing of Florinef patient's symptoms of dizziness and syncope worsened, therefore Dr. Dagoberto Ligas has increased Florinef dosing to 0.1 mg twice daily.  Patient states since increasing Florinef her symptoms have been well controlled.  Although she continues to have episodes of dizziness and near syncope, episodes are significantly less frequent.  She continues with liberal hydration, including Gatorade, and typically wears support stockings.   Patient has returned to working from home for hours a day 4 days/week.  She  denies chest pain, palpitations, dyspnea, leg swelling, orthopnea. She does continue to have chronic back pain. She is slowly recovering, continues to do PT twice weekly without symptomatic hypotension.   Past Medical History:  Diagnosis Date  . Anemia   . Cervical cancer ([place])   . Family history of adverse reaction to anesthesia     " my mother takes a long time time wake up"  . GERD (gastroesophageal reflux disease)   . Headache    migraine  . History of shingles   . Hypertension   . Migraine   . Multiple allergies   . Obesity   . Osteoarthritis    left hip  . PONV (postoperative nausea and vomiting)   . Sleep apnea    does not wear CPAP  . Wears glasses    Past Surgical History:  Procedure Laterality Date  . ABDOMINAL HYSTERECTOMY    . APPENDECTOMY    . BILIOPANCREATIC DIVERSION     with duodenal switch laparoscopic   . CHOLECYSTECTOMY    . DIAGNOSTIC LAPAROSCOPY     LOA  . ESOPHAGOGASTRODUODENOSCOPY (EGD) WITH PROPOFOL N/A 07/25/2017   Procedure: ESOPHAGOGASTRODUODENOSCOPY (EGD) WITH PROPOFOL;  Surgeon: Manya Silvas, MD;  Location: [facility] END[facility]COPY;  Service: Endoscopy;  Laterality: N/A;  . KNEE ARTHROSCOPY    . LAPAROSCOPIC GASTRIC SLEEVE RESECTION WITH HIATAL HERNIA REPAIR    . TONSILLECTOMY    . TOTAL HIP ARTHROPLASTY Left 01/26/2016   Procedure: LEFT TOTAL HIP ARTHROPLASTY ANTERIOR APPROACH;  Surgeon: Leandrew Koyanagi, MD;  Location: [place];  Service: Orthopedics;  Laterality: Left;  . TRANSFORAMINAL  LUMBAR INTERBODY FUSION (TLIF) WITH PEDICLE SCREW FIXATION 3 LEVEL  08/2019   Basil Dess, MD L2-L5   Family History  Problem Relation Age of Onset  . Renal Disease Mother   . Hypertension Mother   . Sudden Cardiac Death Mother   . Heart failure Mother   . Valvular heart disease Mother   . Heart disease Mother   . Stroke Brother   . Heart attack Brother 79  . Diabetes Other   . Breast cancer Cousin        paternal side    Social History   Tobacco Use  .  Smoking status: Never Smoker  . Smokeless tobacco: Never Used  Substance Use Topics  . Alcohol use: Not Currently    Comment: occasional wine   Marital Status: Divorced   ROS  Review of Systems  Constitutional: Negative for malaise/fatigue and weight gain.  Cardiovascular: Positive for near-syncope. Negative for chest pain, claudication, dyspnea on exertion, leg swelling, orthopnea, palpitations, paroxysmal nocturnal dyspnea and syncope.  Respiratory: Negative for shortness of breath.   Hematologic/Lymphatic: Does not bruise/bleed easily.  Musculoskeletal: Positive for arthritis, back pain and muscle weakness.  Gastrointestinal: Negative for melena.  Neurological: Positive for dizziness. Negative for weakness.   Objective  Blood pressure (!) 100/56, pulse 71, temperature 98.2 F (36.8 C), height 5\' 5"  (1.651 m), weight 240 lb (108.9 kg), SpO2 100 %. Body mass index is 39.94 kg/m.   Vitals with BMI 12/09/2020 11/29/2020 11/25/2020  Height 5\' 5"  5\' 5"  -  Weight 240 lbs 237 lbs -  BMI 54.62 70.35 -  Systolic 009 - 381  Diastolic 56 - 66  Pulse 71 - 84    Orthostatic VS for the past 72 hrs (Last 3 readings):  Orthostatic BP Patient Position BP Location Cuff Size Orthostatic Pulse  12/09/20 1413 102/62 Standing Left Arm Large 76  12/09/20 1411 -- Sitting Left Arm Large --  12/09/20 1400 106/59 Supine Left Arm Large 67     Physical Exam Vitals reviewed.  Constitutional:      Comments: Morbidly obese in no acute distress.  HENT:     Head: Normocephalic and atraumatic.  Cardiovascular:     Rate and Rhythm: Normal rate and regular rhythm.     Pulses: Intact distal pulses.          Carotid pulses are 2+ on the right side and 2+ on the left side.      Dorsalis pedis pulses are 2+ on the right side and 2+ on the left side.       Posterior tibial pulses are 2+ on the right side and 2+ on the left side.     Heart sounds: Normal heart sounds, S1 normal and S2 normal. No murmur  heard. No gallop.      Comments: Femoral and popliteal pulse difficult to feel due to patient's body habitus.  No leg edema. JVD difficult to see due to short neck. Pulmonary:     Effort: Pulmonary effort is normal. No respiratory distress.     Breath sounds: Normal breath sounds. No wheezing, rhonchi or rales.  Abdominal:     General: Bowel sounds are normal.     Palpations: Abdomen is soft.     Comments: Obese. Pannus present  Musculoskeletal:     Right lower leg: No edema.     Left lower leg: No edema.  Neurological:     Mental Status: She is alert.    Laboratory examination:   Recent  Labs    08/24/20 0420 08/26/20 0407 08/30/20 0459  NA 139 139 139  K 3.9 3.7 3.6  CL 109 107 109  CO2 20* 22 20*  GLUCOSE 83 92 92  BUN 23* 18 13  CREATININE 0.98 0.99 0.95  CALCIUM 9.3 8.9 8.8*  GFRNONAA >60 >60 >60   CrCl cannot be calculated (Patient's most recent lab result is older than the maximum 21 days allowed.).  CMP Latest Ref Rng & Units 08/30/2020 08/26/2020 08/24/2020  Glucose 70 - 99 mg/dL 92 92 83  BUN 6 - 20 mg/dL 13 18 23(H)  Creatinine 0.44 - 1.00 mg/dL 0.95 0.99 0.98  Sodium 135 - 145 mmol/L 139 139 139  Potassium 3.5 - 5.1 mmol/L 3.6 3.7 3.9  Chloride 98 - 111 mmol/L 109 107 109  CO2 22 - 32 mmol/L 20(L) 22 20(L)  Calcium 8.9 - 10.3 mg/dL 8.8(L) 8.9 9.3  Total Protein 6.5 - 8.1 g/dL - - -  Total Bilirubin 0.3 - 1.2 mg/dL - - -  Alkaline Phos 38 - 126 U/L - - -  AST 15 - 41 U/L - - -  ALT 0 - 44 U/L - - -   CBC Latest Ref Rng & Units 08/30/2020 08/23/2020 08/19/2020  WBC 4.0 - 10.5 K/uL 4.4 7.4 8.6  Hemoglobin 12.0 - 15.0 g/dL 9.3(L) 10.3(L) 10.8(L)  Hematocrit 36.0 - 46.0 % 28.0(L) 32.1(L) 32.8(L)  Platelets 150 - 400 K/uL 202 170 164    Lipid Panel No results for input(s): CHOL, TRIG, LDLCALC, VLDL, HDL, CHOLHDL, LDLDIRECT in the last 8760 hours.  HEMOGLOBIN A1C Lab Results  Component Value Date   HGBA1C 5.1 06/24/2013   TSH Recent Labs     08/26/20 0407  TSH 1.879    External labs:   NA  Medications and allergies   Allergies  Allergen Reactions  . Shellfish Allergy Anaphylaxis  . Meperidine Hcl Other (See Comments)    BRADYCARDIA  . Morphine Other (See Comments)    BRADYCARDIA  . Acetaminophen Itching  . Bee Pollen Other (See Comments)    Unknown  . Ivp Dye [Iodinated Diagnostic Agents] Other (See Comments)    Swelling   . Oxycodone Hives and Itching    Blisters on back  . Codeine Nausea And Vomiting  . Oxycodone-Acetaminophen Itching  . Pentazocine Lactate Nausea And Vomiting    REACTION: vomiting with [place] NX  . Propoxyphene Nausea Only and Nausea And Vomiting  . Propoxyphene N-Acetaminophen Nausea And Vomiting     Outpatient Medications Prior to Visit  Medication Sig Dispense Refill  . aspirin EC 81 MG tablet Take 81 mg by mouth daily. Swallow whole.    . baclofen (LIORESAL) 10 MG tablet Take 1 tablet (10 mg total) by mouth 3 (three) times daily. Take BID but has 3rd dose to take IF NEEDED- for spasms 90 tablet 5  . Biotin w/ Vitamins C & E (HAIR SKIN & NAILS GUMMIES PO) Take by mouth.    . Calcium-Phosphorus-Vitamin D (CALCIUM/D3 ADULT GUMMIES PO) Take by mouth.    . diclofenac Sodium (VOLTAREN) 1 % GEL Apply 4 g topically 4 (four) times daily. 350 g 3  . DULoxetine (CYMBALTA) 30 MG capsule Take 1 capsule (30 mg total) by mouth at bedtime. X 1 week, then 60 mg/2 capsules nightly- for nerve pain 60 capsule 5  . fludrocortisone (FLORINEF) 0.1 MG tablet Take 0.1 mg by mouth 2 (two) times daily.    . fluticasone (FLONASE) 50 MCG/ACT nasal spray  Place 1-2 sprays into both nostrils daily.    Marland Kitchen gabapentin (NEURONTIN) 400 MG capsule TAKE 3 CAPSULES(1200 MG) BY MOUTH AT BEDTIME 90 capsule 5  . Lifitegrast 5 % SOLN Apply to eye.    . midodrine (PROAMATINE) 10 MG tablet Take 1 tablet (10 mg total) by mouth 3 (three) times daily. While awake. Can take one additional if dizziness 270 tablet 3  . montelukast  (SINGULAIR) 10 MG tablet Take 1 tablet (10 mg total) by mouth at bedtime. For allergies 30 tablet 5  . Multiple Vitamin (MULTIVITAMIN WITH MINERALS) TABS tablet Take 1 tablet by mouth 2 (two) times daily.    . Multiple Vitamins-Minerals (BARIATRIC FUSION PO) Take by mouth.    . pramipexole (MIRAPEX) 0.125 MG tablet Take 0.5 mg by mouth at bedtime.    . promethazine (PHENERGAN) 12.5 MG tablet Take 1 tablet (12.5 mg total) by mouth every 6 (six) hours as needed for nausea or vomiting. 60 tablet 1  . pyridOXINE (VITAMIN B-6) 100 MG tablet Take 1 tablet (100 mg total) by mouth daily. 90 tablet 1  . Thiamine HCl (VITAMIN B-1) 250 MG tablet Take 1 tablet (250 mg total) by mouth daily. 90 tablet 1  . topiramate (TOPAMAX) 100 MG tablet TAKE 1 TABLET(100 MG) BY MOUTH AT BEDTIME 90 tablet 5  . traZODone (DESYREL) 50 MG tablet Take 0.5-1 tablets (25-50 mg total) by mouth at bedtime as needed for sleep. 15 tablet 0  . vitamin B-12 (CYANOCOBALAMIN) 1000 MCG tablet Take 1 tablet (1,000 mcg total) by mouth daily. 90 tablet 1  . HYDROmorphone (DILAUDID) 4 MG tablet Take by mouth. (Patient not taking: Reported on 11/29/2020)     No facility-administered medications prior to visit.    Radiology:   No results found.  Cardiac Studies:   Echocardiogram 08/14/2018: - Left ventricle: The cavity size was normal. Wall thickness was   increased in a pattern of mild LVH. Systolic function was normal.   The estimated ejection fraction was in the range of 55% to 60%.   Wall motion was normal; there were no regional wall motion   abnormalities. Left ventricular diastolic function parameters   were normal. - Right ventricle: The cavity size was normal. Wall thickness was   normal. Systolic function was normal.   EKG:   EKG 09/10/2020: Normal sinus rhythm at rate of 66 bpm, normal axis, no evidence of ischemia, normal EKG.      Assessment     ICD-10-CM   1. Neurogenic orthostatic hypotension (HCC)  G90.3       Medications Discontinued During This Encounter  Medication Reason  . HYDROmorphone (DILAUDID) 4 MG tablet Error    No orders of the defined types were placed in this encounter.  No orders of the defined types were placed in this encounter.   Recommendations:   Vanessa Medina is a 50 y.o. with chronic dizziness, morbid obesity, history of gastric sleeve procedure in 2015 and revision in 2019. Previously seen by Dr. Ida Rogue, and originally referred to our office for evaluation of postural orthostatic tachycardia syndrome, however review of her record shows patient does not have POTS.  Rather patient only has tachycardia component of POTS.    Patient symptoms of orthostasis started approximately 5 years ago and may be related to underlying nutritional deficiency following bariatric procedure, polypharmacy as well.   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    Patient presents for 30-month follow-up of orthostatic hypotension.  Patient symptoms worsened with decrease in Florinef dosing, therefore half was increased by physical medicine doctor to 0.1 mg twice daily.  Since then patient symptoms have been relatively well controlled.  She continues to take B12, B6, B1 supplements as well as midodrine 10 mg 3 times daily.  Symptoms are currently stable and patient is not orthostatic in our office today,  will not make changes to her medications at this time.  Follow up in 3 months, sooner if needed, for orthostatic hypotension.    Alethia Berthold, PA-C 12/09/2020, 5:06 PM Office: [phone]

## 2020-12-13 ENCOUNTER — Ambulatory Visit: Payer: 59

## 2020-12-13 ENCOUNTER — Telehealth: Payer: Self-pay

## 2020-12-13 NOTE — Telephone Encounter (Signed)
Entered in error

## 2020-12-15 ENCOUNTER — Ambulatory Visit: Payer: 59

## 2020-12-15 ENCOUNTER — Other Ambulatory Visit: Payer: Self-pay

## 2020-12-15 DIAGNOSIS — M5441 Lumbago with sciatica, right side: Secondary | ICD-10-CM

## 2020-12-15 DIAGNOSIS — R2689 Other abnormalities of gait and mobility: Secondary | ICD-10-CM

## 2020-12-15 DIAGNOSIS — M6281 Muscle weakness (generalized): Secondary | ICD-10-CM | POA: Diagnosis not present

## 2020-12-15 NOTE — Therapy (Signed)
[place] MAIN [facility] SERVICES [address] [place], [place], [address] Phone: [phone]   Fax:  [phone]  Physical Therapy Treatment  Patient Details  Name: Vanessa Medina MRN: [id number] Date of Birth: 1971-08-19 Referring Provider (PT): Dr. Erling Cruz   Encounter Date: 12/15/2020   PT End of Session - 12/15/20 1128    Visit Number 26    Number of Visits 53    Date for PT Re-Evaluation 01/20/21    Authorization Type eval: 12/29; prog note performed 11/18/2020    PT Start Time 0932    PT Stop Time 1014    PT Time Calculation (min) 42 min    Equipment Utilized During Treatment Gait belt    Activity Tolerance Patient tolerated treatment well    Behavior During Therapy [facility] for tasks assessed/performed           Past Medical History:  Diagnosis Date  . Anemia   . Cervical cancer ([place])   . Family history of adverse reaction to anesthesia     " my mother takes a long time time wake up"  . GERD (gastroesophageal reflux disease)   . Headache    migraine  . History of shingles   . Hypertension   . Migraine   . Multiple allergies   . Obesity   . Osteoarthritis    left hip  . PONV (postoperative nausea and vomiting)   . Sleep apnea    does not wear CPAP  . Wears glasses     Past Surgical History:  Procedure Laterality Date  . ABDOMINAL HYSTERECTOMY    . APPENDECTOMY    . BILIOPANCREATIC DIVERSION     with duodenal switch laparoscopic   . CHOLECYSTECTOMY    . DIAGNOSTIC LAPAROSCOPY     LOA  . ESOPHAGOGASTRODUODENOSCOPY (EGD) WITH PROPOFOL N/A 07/25/2017   Procedure: ESOPHAGOGASTRODUODENOSCOPY (EGD) WITH PROPOFOL;  Surgeon: Manya Silvas, MD;  Location: [facility] ENDOSCOPY;  Service: Endoscopy;  Laterality: N/A;  . KNEE ARTHROSCOPY    . LAPAROSCOPIC GASTRIC SLEEVE RESECTION WITH HIATAL HERNIA REPAIR    . TONSILLECTOMY    . TOTAL HIP ARTHROPLASTY Left 01/26/2016   Procedure: LEFT TOTAL HIP ARTHROPLASTY ANTERIOR APPROACH;   Surgeon: Leandrew Koyanagi, MD;  Location: [place];  Service: Orthopedics;  Laterality: Left;  . TRANSFORAMINAL LUMBAR INTERBODY FUSION (TLIF) WITH PEDICLE SCREW FIXATION 3 LEVEL  08/2019   Basil Dess, MD L2-L5    There were no vitals filed for this visit.   Subjective Assessment - 12/15/20 1126    Subjective Pt states back pain has been high recently. She says pain is 8.5-9/10. Pt reporting her knees hurt as well. She did not get good sleep last night.    Pertinent History 50 year old female with history of HTN, migraines, morbid obesity--BMI-41, chronic hypotension, multiple back surgeries with chronic pain with LLE lumbar radiculopathy who was scheduled to have spinal cord stimulator placed by Dr. Davy Pique on 08/11/2020 but unable to perform surgery due to scar tissue.  History taken from patient and chart review.  Post op on awakening, she had excruciating pain RLE with plegia and and allodynia. She was admitted for work up by Dr. Ronnald Ramp from surgical center on 08/11/2020. Thoracic MRI done revealing subtle increased T2/STIR intensity distal cord at T12-L1 level suspicious for edema/acute spinal cord injury, no CSF leak as well as prior PLIF L2-L5 with residual foraminal protrusion L3/5 potentially irritating right L3 nerve. She was started on IV Dilaudid, gabapentin  as well as IV Decadron for management of pain, headaches and severe neuropathy.  She continues to have pain in RLE but is having some motor return, has constant headache, decreased hearing in right> left ear, constipation with nausea as well as epigastric pain and weakness. Therapy ongoing and CIR recommended due to functional decline.  Of note, she reports 50 lbs weigh loss in the past 3-4 months--due to issues with N/V/intake. Has had two falls last month---no recall of incident leading to fall question due to syncope. Was in process of work up for renal disease. She has had issue with LLE weakness with pain for the past year and being followed by  Dr. Ernestina Patches. Did well with stimulator trial.    How long can you sit comfortably? 1 hour    How long can you stand comfortably? 8 mins    How long can you walk comfortably? 20-30 minutes    Currently in Pain? Yes    Pain Score 9     Pain Location Back    Pain Orientation Lower    Pain Onset More than a month ago   pt with chronic back pain from over a month ago but with acute pain as well due to recent fall at home          TREATMENT Hot pack placed to low back with pt in supine for approximately 30 minutes due to pt reports of increased LBP today. Skin checked throughout session with no adverse reaction observed to treatment. Pt also reports hot pack feels "good," on low back.   All exercises performed with pt in supine: Adductor squeezes with ball 2x20; VC for technique TA contraction with posterior pelvic tilt anD LE marches 3x10 BLEs; VC/TC for technique. Lower trunk rotations (assisted) 2x20. Pt greater motion to R side. AAROM SAQ/knee extension for pain modulation x multiple reps BLEs Assisted knee to chest on RLE 10x past 90 deg hip flexion with pt with muscle tremors along RLE. Discontinued d/t increased pain and RLE tremors. Shaking resolved when pt decreased hip flexion but increased knee flexion.  TA activation with pelvic tilt 1x10 with 3 second isometric; VC for technique. RTB hip abduction 3x15; VC/TC for technique Marches with RTB 3x10; VC/TC for technique TA activation with holding 500 GR ball for chest press 3x10; VC/TC and demo for technique  Education provided throughout session in the form of VC/TC and demonstration to facilitate correct muscle activation and movement at target joints with all exercises. The pt exhibited good carryover within session after cuing.    PT Education - 12/15/20 1127    Education Details exercise technique, body mechanics with TA activatoin with chest press    Person(s) Educated Patient    Methods Explanation;Demonstration;Verbal cues     Comprehension Returned demonstration;Verbalized understanding            PT Short Term Goals - 11/19/20 0807      PT SHORT TERM GOAL #1   Title Pt will be independent with HEP in order to improve strength and balance in order to decrease fall risk and improve function at home and work.    Baseline 10/14/2020 on-going/deferred; 10/28/2020 ongoing pt has resumed HEP, reports doing what she can/HEP to be advanced; 11/18/2020  Pt has had trouble doing HEP d/t fatigue.    Time 6    Period Weeks    Status On-going    Target Date 12/09/20             PT  Long Term Goals - 11/18/20 0953      PT LONG TERM GOAL #1   Title Patient (< 23 years old) will complete five times sit to stand test in < 10 seconds indicating an increased LE strength and improved balance.    Baseline 12/29: 38.45s; 10/14/2020 42.8 seconds; 10/28/2020 36 seconds; 11/18/2020 34.84 seconds    Time 12    Period Weeks    Status On-going    Target Date 01/20/21      PT LONG TERM GOAL #2   Title Pt will decrease TUG to below 14 seconds/decrease in order to demonstrate decreased fall risk.    Baseline 12/29: 32.94s; 10/14/2020 43.12 sec; 10/28/2020 34.12 seconds; 11/18/2020 26.5 sec    Time 12    Period Weeks    Status On-going    Target Date 01/20/21      PT LONG TERM GOAL #3   Title Pt will decrease DHI score by at least 18 points in order to demonstrate clinically significant reduction in disability    Baseline 12/29: 30; 10/14/2020 62, indicating severe handicap; 10/28/2020 74    Time 12    Period Weeks    Status On-going    Target Date 01/20/21      PT LONG TERM GOAL #4   Title Patient will increase 10 meter walk test to >1.20m/s as to improve gait speed for better community ambulation and to reduce fall risk.    Baseline 12/29: 0.37 m/s; 0.38 m/s; 2/10/08/2020 0.31 m/s 11/18/2020 0.34 m/s with SPC    Time 12    Period Weeks    Status On-going    Target Date 01/20/21      PT LONG TERM GOAL #5   Title Patient will  increase FOTO score to equal to or greater than 64 to demonstrate statistically significant improvement in mobility and quality of life.    Baseline 12/29: 52; 10/14/2020 45; 10/28/2020 45; 11/18/2020 45    Time 12    Period Weeks    Status On-going    Target Date 01/20/21                 Plan - 12/15/20 1128    Clinical Impression Statement Pt presenting with tremors throughout RLE when attempting some therex today. Pt reports these sx are not new and she will soon be following up with her Dr. Mallie Darting in RLE resolved with decreasing R hip flexion, but with increasing R knee flexion. D/T pt high back pain levels PT session focused on instructing pt in and having pt perform gentle strengthening and mobility exercises in supine. Pt with observed and reported greater ease with lumbar rotation to R compared to L. Pt was able to perform supine marches later in session without sx increase. The pt will benefit from further skilled therapy to improve, mobility, pain, and BLE strength in order to increase QOL.    Personal Factors and Comorbidities Comorbidity 3+;Time since onset of injury/illness/exacerbation    Comorbidities HYPOtension, paraparesis, SCI without spinal bone injury, DDD, cervical cancer, acute kidney injury, GERD, asthma, bradycardia, L total hip replacement, hypokalemia    Examination-Activity Limitations Squat;Lift;Stairs;Bend;Stand;Reach Overhead;Carry;Transfers    Examination-Participation Restrictions Cleaning;Laundry;Meal Prep;Community Activity;Driving;Occupation    Stability/Clinical Decision Making Evolving/Moderate complexity    Rehab Potential Fair    PT Frequency 3x / week    PT Duration 12 weeks    PT Treatment/Interventions ADLs/Self Care Home Management;Aquatic Therapy;Gait training;Stair training;Functional mobility training;Therapeutic activities;Therapeutic exercise;Balance training;Moist Heat;Manual techniques;Patient/family education;Neuromuscular  re-education;Passive range  of motion;Energy conservation;Vestibular;Joint Manipulations;Spinal Manipulations    PT Next Visit Plan LE stretching for pain modulation and improved hip mobility; trial treadmill walking, gait training; step-length/gait technique training; nustep for endurance; back mobility therex, progress treadmill endurance and gait training; STM to cervical paraspinals and upper trap/levator scap, continue POC as indicated previously    PT Home Exercise Plan PROGRESS note: add new HEP, initiate UBE bike for cardiovasc endurance, R ankle motor control/strength, focus on STS and ambulation; added 11/18/2020 (handout) hip adduction, thoracic lumbar extension and sit<>stand with counter support    Consulted and Agree with Plan of Care Patient           Patient will benefit from skilled therapeutic intervention in order to improve the following deficits and impairments:  Abnormal gait,Dizziness,Improper body mechanics,Decreased mobility,Cardiopulmonary status limiting activity,Decreased activity tolerance,Decreased endurance,Decreased range of motion,Decreased strength,Decreased balance,Decreased safety awareness,Difficulty walking,Impaired flexibility,Obesity  Visit Diagnosis: Other abnormalities of gait and mobility  Muscle weakness (generalized)  Acute bilateral low back pain with right-sided sciatica     Problem List Patient Active Problem List   Diagnosis Date Noted  . Nerve pain 10/18/2020  . Incomplete paraplegia ([place]) 10/18/2020  . Orthostatic hypotension 10/18/2020  . Chronic migraine without aura without status migrainosus, not intractable   . Hypokalemia   . Adjustment reaction with anxiety and depression   . Spinal cord injury, lumbar, without spinal bone injury, sequela ([place]) 08/18/2020  . Paraparesis ([place])   . Post-operative pain   . Hypotension   . Slow transit constipation   . AKI (acute kidney injury) ([place])   . Right leg weakness 08/11/2020  . DDD  (degenerative disc disease), lumbar 09/02/2019    Class: Chronic  . Degenerative disc disease, lumbar 09/02/2019  . Chest tightness 09/23/2018  . Bradycardia 09/23/2018  . Labile blood pressure 03/08/2018  . Chronic low back pain 09/03/2017  . History of total hip replacement, left 01/26/2016  . EDEMA 04/28/2008  . LEG PAIN, BILATERAL 12/16/2007  . CERVICAL CANCER 07/02/2007  . MORBID OBESITY 07/02/2007  . DEPRESSION 07/02/2007  . COMMON MIGRAINE 07/02/2007  . Allergic rhinitis 07/02/2007  . ASTHMA 07/02/2007  . GERD 07/02/2007  . ELEVATED BLOOD PRESSURE WITHOUT DIAGNOSIS OF HYPERTENSION 07/02/2007   Ricard Dillon PT, DPT 12/15/2020, 11:39 AM  [place] MAIN [facility] SERVICES [address] [place], [place], [address] Phone: [phone]   Fax:  [phone]  Name: Vanessa Medina MRN: [id number] Date of Birth: August 04, 1971

## 2020-12-16 ENCOUNTER — Ambulatory Visit (INDEPENDENT_AMBULATORY_CARE_PROVIDER_SITE_OTHER): Payer: 59 | Admitting: Specialist

## 2020-12-16 ENCOUNTER — Encounter: Payer: Self-pay | Admitting: Specialist

## 2020-12-16 ENCOUNTER — Other Ambulatory Visit: Payer: Self-pay

## 2020-12-16 VITALS — BP 101/67 | HR 66 | Ht 65.0 in | Wt 240.0 lb

## 2020-12-16 DIAGNOSIS — M170 Bilateral primary osteoarthritis of knee: Secondary | ICD-10-CM

## 2020-12-16 DIAGNOSIS — M1712 Unilateral primary osteoarthritis, left knee: Secondary | ICD-10-CM | POA: Diagnosis not present

## 2020-12-16 DIAGNOSIS — M5136 Other intervertebral disc degeneration, lumbar region: Secondary | ICD-10-CM | POA: Diagnosis not present

## 2020-12-16 DIAGNOSIS — M5440 Lumbago with sciatica, unspecified side: Secondary | ICD-10-CM

## 2020-12-16 DIAGNOSIS — M1711 Unilateral primary osteoarthritis, right knee: Secondary | ICD-10-CM | POA: Diagnosis not present

## 2020-12-16 DIAGNOSIS — M4714 Other spondylosis with myelopathy, thoracic region: Secondary | ICD-10-CM

## 2020-12-16 DIAGNOSIS — G8929 Other chronic pain: Secondary | ICD-10-CM

## 2020-12-16 DIAGNOSIS — F458 Other somatoform disorders: Secondary | ICD-10-CM

## 2020-12-16 DIAGNOSIS — R200 Anesthesia of skin: Secondary | ICD-10-CM

## 2020-12-16 DIAGNOSIS — M51369 Other intervertebral disc degeneration, lumbar region without mention of lumbar back pain or lower extremity pain: Secondary | ICD-10-CM

## 2020-12-16 MED ORDER — NORTRIPTYLINE HCL 10 MG PO CAPS: 10.0000 mg | ORAL_CAPSULE | Freq: Every day | ORAL | 3 refills | Status: DC

## 2020-12-16 MED ORDER — METHYLPREDNISOLONE ACETATE 40 MG/ML IJ SUSP: 40.0000 mg | INTRAMUSCULAR | Status: DC | PRN

## 2020-12-16 MED ORDER — METHYLPREDNISOLONE ACETATE 40 MG/ML IJ SUSP
40.0000 mg | INTRAMUSCULAR | Status: AC | PRN
  Administered 2020-12-16: 40 mg via INTRA_ARTICULAR

## 2020-12-16 MED ORDER — BUPIVACAINE HCL 0.5 % IJ SOLN
3.0000 mL | INTRAMUSCULAR | Status: DC | PRN
Start: 2020-12-16 — End: 2020-12-16

## 2020-12-16 MED ORDER — BUPIVACAINE HCL 0.5 % IJ SOLN
3.0000 mL | INTRAMUSCULAR | Status: AC | PRN
  Administered 2020-12-16: 3 mL via INTRA_ARTICULAR

## 2020-12-16 MED ORDER — BUPIVACAINE HCL 0.5 % IJ SOLN
3.0000 mL | INTRAMUSCULAR | Status: AC | PRN
Start: 2020-12-16 — End: 2020-12-16
  Administered 2020-12-16: 3 mL via INTRA_ARTICULAR

## 2020-12-16 NOTE — Progress Notes (Addendum)
Office Visit Note   Patient: Vanessa Medina           Date of Birth: [birth year]           MRN: [id number] Visit Date: 12/16/2020              Requested by: Maryland Pink, MD [address] [place],  [place] [address] PCP: Maryland Pink, MD   Assessment & Plan: Visit Diagnoses:  1. Myelopathy of thoracic region   2. Lumbar degenerative disc disease   3. Neurogenic pruritus   4. Osteoarthritis of right patellofemoral joint   5. Unilateral primary osteoarthritis, right knee   6. Numbness in left leg   7. Chronic low back pain with sciatica, sciatica laterality unspecified, unspecified back pain laterality   8. Osteoarthritis of patellofemoral joints of both knees     Plan:Plan: For pruritis try capsaicin oint or cream, it is over the counter and is used for neuropathic pain. Also biofreeze containing menthol is sometimes helpful. Voltaren gel for the right knee osteoarthritis may be helpful.  The main ways of treat osteoarthritis, that are found to be success. Weight loss helps to decrease pain. Exercise is important to maintaining cartilage and thickness and strengthening. NSAIDs like motrin, tylenol, alleve are meds decreasing the inflamation. Ice is okay  In afternoon and evening and hot shower in the am  Follow-Up Instructions: No follow-ups on file.   Orders:  No orders of the defined types were placed in this encounter.  No orders of the defined types were placed in this encounter.   Follow-Up Instructions: Return in about 4 weeks (around 01/13/2021).   Orders:  Orders Placed This Encounter  Procedures  . Large Joint Inj: bilateral knee  . Ambulatory referral to Neurology   Meds ordered this encounter  Medications  . nortriptyline (PAMELOR) 10 MG capsule    Sig: Take 1 capsule (10 mg total) by mouth at bedtime.    Dispense:  30 capsule    Refill:  3  . DISCONTD: methylPREDNISolone acetate (DEPO-MEDROL) injection 40 mg  . DISCONTD:  bupivacaine (MARCAINE) 0.5 % (with pres) injection 3 mL  . bupivacaine (MARCAINE) 0.5 % (with pres) injection 3 mL  . methylPREDNISolone acetate (DEPO-MEDROL) injection 40 mg  . bupivacaine (MARCAINE) 0.5 % (with pres) injection 3 mL  . methylPREDNISolone acetate (DEPO-MEDROL) injection 40 mg      Procedures: Large Joint Inj: bilateral knee on 12/16/2020 5:39 PM Indications: pain Details: 25 G 1.5 in needle, anterolateral approach  Arthrogram: No  Medications (Right): 3 mL bupivacaine 0.5 %; 40 mg methylPREDNISolone acetate 40 MG/ML Medications (Left): 3 mL bupivacaine 0.5 %; 40 mg methylPREDNISolone acetate 40 MG/ML Outcome: tolerated well, no immediate complications  Bandaid applied to both knees. Procedure, treatment alternatives, risks and benefits explained, specific risks discussed. Consent was given by the patient. Immediately prior to procedure a time out was called to verify the correct patient, procedure, equipment, support staff and site/side marked as required. Patient was prepped and draped in the usual sterile fashion.       Clinical Data: No additional findings.   Subjective: Chief Complaint  Patient presents with  . Lower Back - Pain  . Right Knee - Pain  . Left Knee - Pain    50 year old female with history of [place] injury while undergoing SCs placement. She has returned to work recently and her works at [employer] and has only recently  received an Special educational needs teacher and  this will help.She is having bilateral knee pain in the kneecaps and left lateral knee. She is also experiencing back pain and this is worse with standing and walking. Trying to get up and do anything she is tending to stoop and be bent over. She is referred to pain management and then for the SCS and then had the  Injury. She is at at point were she is having pain into the bottom of the right foot and the toes are painful. Does not smoke and is taking baclofen TID.    Review of Systems   Constitutional: Positive for unexpected weight change. Negative for activity change, appetite change, chills, diaphoresis, fatigue and fever.  HENT: Negative.  Negative for congestion, dental problem, drooling, ear discharge, ear pain, facial swelling, hearing loss, mouth sores, nosebleeds, postnasal drip, rhinorrhea, sinus pressure, sinus pain, sneezing, sore throat, tinnitus, trouble swallowing and voice change.   Eyes: Negative for photophobia, pain, discharge, redness, itching and visual disturbance.  Respiratory: Negative for apnea, cough, choking, chest tightness, shortness of breath, wheezing and stridor.   Cardiovascular: Negative.  Negative for chest pain, palpitations and leg swelling.  Gastrointestinal: Negative for abdominal distention, abdominal pain, anal bleeding, blood in stool, constipation, diarrhea, nausea, rectal pain and vomiting.  Endocrine: Negative for cold intolerance, heat intolerance, polydipsia, polyphagia and polyuria.  Genitourinary: Negative.  Negative for difficulty urinating, dyspareunia, dysuria, enuresis, flank pain, frequency, hematuria, menstrual problem and pelvic pain.  Musculoskeletal: Positive for arthralgias, back pain and gait problem. Negative for joint swelling, myalgias, neck pain and neck stiffness.  Skin: Negative.  Negative for color change, pallor, rash and wound.  Allergic/Immunologic: Positive for environmental allergies. Negative for food allergies and immunocompromised state.  Neurological: Positive for dizziness, weakness, light-headedness, numbness and headaches. Negative for tremors, seizures, syncope, facial asymmetry and speech difficulty.  Hematological: Negative for adenopathy. Does not bruise/bleed easily.  Psychiatric/Behavioral: Positive for decreased concentration and sleep disturbance. Negative for agitation, behavioral problems, confusion, dysphoric mood, hallucinations, self-injury and suicidal ideas. The patient is nervous/anxious.  The patient is not hyperactive.      Objective: Vital Signs: BP 101/67   Pulse 66   Ht 5\' 5"  (1.651 m)   Wt 240 lb (108.9 kg)   BMI 39.94 kg/m   Physical Exam Constitutional:      Appearance: She is well-developed.  HENT:     Head: Normocephalic and atraumatic.  Eyes:     Pupils: Pupils are equal, round, and reactive to light.  Pulmonary:     Effort: Pulmonary effort is normal.     Breath sounds: Normal breath sounds.  Abdominal:     General: Bowel sounds are normal.     Palpations: Abdomen is soft.  Musculoskeletal:        General: Normal range of motion.     Cervical back: Normal range of motion and neck supple.     Left knee: No effusion.     Instability Tests: Lateral McMurray test positive. Medial McMurray test negative.  Skin:    General: Skin is warm and dry.  Neurological:     Mental Status: She is alert and oriented to person, place, and time.  Psychiatric:        Behavior: Behavior normal.        Thought Content: Thought content normal.        Judgment: Judgment normal.     Right Knee Exam   Range of Motion  Extension: normal  Flexion: normal    Left Knee Exam  Tenderness  The patient is experiencing tenderness in the patella, lateral joint line and lateral retinaculum.  Range of Motion  Extension: normal  Flexion: normal   Tests  McMurray:  Medial - negative Lateral - positive Valgus: positive Lachman:  Anterior - negative    Posterior - negative Drawer:  Anterior - negative     Posterior - negative  Other  Effusion: no effusion present      Specialty Comments:  No specialty comments available.  Imaging: No results found.   PMFS History: Patient Active Problem List   Diagnosis Date Noted  . DDD (degenerative disc disease), lumbar 09/02/2019    Priority: High    Class: Chronic  . Nerve pain 10/18/2020  . Incomplete paraplegia ([place]) 10/18/2020  . Orthostatic hypotension 10/18/2020  . Chronic migraine without aura without  status migrainosus, not intractable   . Hypokalemia   . Adjustment reaction with anxiety and depression   . Spinal cord injury, lumbar, without spinal bone injury, sequela ([place]) 08/18/2020  . Paraparesis ([place])   . Post-operative pain   . Hypotension   . Slow transit constipation   . AKI (acute kidney injury) ([place])   . Right leg weakness 08/11/2020  . Degenerative disc disease, lumbar 09/02/2019  . Chest tightness 09/23/2018  . Bradycardia 09/23/2018  . Labile blood pressure 03/08/2018  . Chronic low back pain 09/03/2017  . History of total hip replacement, left 01/26/2016  . EDEMA 04/28/2008  . LEG PAIN, BILATERAL 12/16/2007  . CERVICAL CANCER 07/02/2007  . MORBID OBESITY 07/02/2007  . DEPRESSION 07/02/2007  . COMMON MIGRAINE 07/02/2007  . Allergic rhinitis 07/02/2007  . ASTHMA 07/02/2007  . GERD 07/02/2007  . ELEVATED BLOOD PRESSURE WITHOUT DIAGNOSIS OF HYPERTENSION 07/02/2007   Past Medical History:  Diagnosis Date  . Anemia   . Cervical cancer ([place])   . Family history of adverse reaction to anesthesia     " my mother takes a long time time wake up"  . GERD (gastroesophageal reflux disease)   . Headache    migraine  . History of shingles   . Hypertension   . Migraine   . Multiple allergies   . Obesity   . Osteoarthritis    left hip  . PONV (postoperative nausea and vomiting)   . Sleep apnea    does not wear CPAP  . Wears glasses     Family History  Problem Relation Age of Onset  . Renal Disease Mother   . Hypertension Mother   . Sudden Cardiac Death Mother   . Heart failure Mother   . Valvular heart disease Mother   . Heart disease Mother   . Stroke Brother   . Heart attack Brother 69  . Diabetes Other   . Breast cancer Cousin        paternal side    Past Surgical History:  Procedure Laterality Date  . ABDOMINAL HYSTERECTOMY    . APPENDECTOMY    . BILIOPANCREATIC DIVERSION     with duodenal switch laparoscopic   . CHOLECYSTECTOMY    . DIAGNOSTIC  LAPAROSCOPY     LOA  . ESOPHAGOGASTRODUODENOSCOPY (EGD) WITH PROPOFOL N/A 07/25/2017   Procedure: ESOPHAGOGASTRODUODENOSCOPY (EGD) WITH PROPOFOL;  Surgeon: Manya Silvas, MD;  [facility] ENDOSCOPY;  Service: Endoscopy;  Laterality: N/A;  . KNEE ARTHROSCOPY    . LAPAROSCOPIC GASTRIC SLEEVE RESECTION WITH HIATAL HERNIA REPAIR    . TONSILLECTOMY    . TOTAL HIP ARTHROPLASTY Left 01/26/2016   Procedure: LEFT TOTAL  HIP ARTHROPLASTY ANTERIOR APPROACH;  Surgeon: Leandrew Koyanagi, MD;  Location: [place];  Service: Orthopedics;  Laterality: Left;  . TRANSFORAMINAL LUMBAR INTERBODY FUSION (TLIF) WITH PEDICLE SCREW FIXATION 3 LEVEL  08/2019   Basil Dess, MD L2-L5   Social History   Occupational History  . Not on file  Tobacco Use  . Smoking status: Never Smoker  . Smokeless tobacco: Never Used  Vaping Use  . Vaping Use: Never used  Substance and Sexual Activity  . Alcohol use: Not Currently    Comment: occasional wine  . Drug use: No  . Sexual activity: Not Currently

## 2020-12-16 NOTE — Patient Instructions (Signed)
Plan: For pruritis try capsaicin oint or cream, it is over the counter and is used for neuropathic pain. Also biofreeze containing menthol is sometimes helpful. Voltaren gel for the right knee osteoarthritis may be helpful.  The main ways of treat osteoarthritis, that are found to be success. Weight loss helps to decrease pain. Exercise is important to maintaining cartilage and thickness and strengthening. NSAIDs like motrin, tylenol, alleve are meds decreasing the inflamation. Ice is okay  In afternoon and evening and hot shower in the am Pamelor 10 mg po qhs for neurogenic pain Follow-Up Instructions: No follow-ups on file.   Orders:  No orders of the defined types were placed in this encounter.  No orders of the defined types were placed in this encounter.

## 2020-12-20 ENCOUNTER — Ambulatory Visit: Payer: 59

## 2020-12-20 ENCOUNTER — Other Ambulatory Visit: Payer: Self-pay | Admitting: Radiology

## 2020-12-21 ENCOUNTER — Ambulatory Visit: Payer: 59

## 2020-12-21 ENCOUNTER — Other Ambulatory Visit: Payer: Self-pay

## 2020-12-21 DIAGNOSIS — M6281 Muscle weakness (generalized): Secondary | ICD-10-CM | POA: Diagnosis not present

## 2020-12-21 DIAGNOSIS — G8929 Other chronic pain: Secondary | ICD-10-CM

## 2020-12-21 DIAGNOSIS — M545 Low back pain, unspecified: Secondary | ICD-10-CM

## 2020-12-21 DIAGNOSIS — M5441 Lumbago with sciatica, right side: Secondary | ICD-10-CM

## 2020-12-21 DIAGNOSIS — R2689 Other abnormalities of gait and mobility: Secondary | ICD-10-CM

## 2020-12-21 NOTE — Therapy (Signed)
[place] MAIN [facility] SERVICES [address] [place], [place], [address] Phone: [phone]   Fax:  [phone]  Physical Therapy Treatment  Patient Details  Name: Vanessa Medina MRN: [id number] Date of Birth: [birth year] Referring Provider (PT): Dr. Erling Cruz   Encounter Date: 12/21/2020   PT End of Session - 12/21/20 1457    Visit Number 27    Number of Visits 53    Date for PT Re-Evaluation 01/20/21    Authorization Type eval: 12/29; prog note performed 11/18/2020    PT Start Time 1440    PT Stop Time 1525    PT Time Calculation (min) 45 min    Equipment Utilized During Treatment Gait belt    Activity Tolerance Patient tolerated treatment well    Behavior During Therapy [facility] for tasks assessed/performed           Past Medical History:  Diagnosis Date  . Anemia   . Cervical cancer ([place])   . Family history of adverse reaction to anesthesia     " my mother takes a long time time wake up"  . GERD (gastroesophageal reflux disease)   . Headache    migraine  . History of shingles   . Hypertension   . Migraine   . Multiple allergies   . Obesity   . Osteoarthritis    left hip  . PONV (postoperative nausea and vomiting)   . Sleep apnea    does not wear CPAP  . Wears glasses     Past Surgical History:  Procedure Laterality Date  . ABDOMINAL HYSTERECTOMY    . APPENDECTOMY    . BILIOPANCREATIC DIVERSION     with duodenal switch laparoscopic   . CHOLECYSTECTOMY    . DIAGNOSTIC LAPAROSCOPY     LOA  . ESOPHAGOGASTRODUODENOSCOPY (EGD) WITH PROPOFOL N/A 07/25/2017   Procedure: ESOPHAGOGASTRODUODENOSCOPY (EGD) WITH PROPOFOL;  Surgeon: Manya Silvas, MD;  Location: [facility] ENDOSCOPY;  Service: Endoscopy;  Laterality: N/A;  . KNEE ARTHROSCOPY    . LAPAROSCOPIC GASTRIC SLEEVE RESECTION WITH HIATAL HERNIA REPAIR    . TONSILLECTOMY    . TOTAL HIP ARTHROPLASTY Left 01/26/2016   Procedure: LEFT TOTAL HIP ARTHROPLASTY ANTERIOR APPROACH;   Surgeon: Leandrew Koyanagi, MD;  Location: [facility];  Service: Orthopedics;  Laterality: Left;  . TRANSFORAMINAL LUMBAR INTERBODY FUSION (TLIF) WITH PEDICLE SCREW FIXATION 3 LEVEL  08/2019   Basil Dess, MD L2-L5    There were no vitals filed for this visit.   Subjective Assessment - 12/21/20 1455    Subjective Patient has received bilateral knee injections last Thursday but has not seen any progress in knee pain. Low back pain continues to be an issue affecting her at home and work.    Pertinent History 50 year old female with history of HTN, migraines, morbid obesity--BMI-41, chronic hypotension, multiple back surgeries with chronic pain with LLE lumbar radiculopathy who was scheduled to have spinal cord stimulator placed by Dr. Davy Pique on 08/11/2020 but unable to perform surgery due to scar tissue.  History taken from patient and chart review.  Post op on awakening, she had excruciating pain RLE with plegia and and allodynia. She was admitted for work up by Dr. Ronnald Ramp from surgical center on 08/11/2020. Thoracic MRI done revealing subtle increased T2/STIR intensity distal cord at T12-L1 level suspicious for edema/acute spinal cord injury, no CSF leak as well as prior PLIF L2-L5 with residual foraminal protrusion L3/5 potentially irritating right L3 nerve. She was started on  IV Dilaudid, gabapentin as well as IV Decadron for management of pain, headaches and severe neuropathy.  She continues to have pain in RLE but is having some motor return, has constant headache, decreased hearing in right> left ear, constipation with nausea as well as epigastric pain and weakness. Therapy ongoing and CIR recommended due to functional decline.  Of note, she reports 50 lbs weigh loss in the past 3-4 months--due to issues with N/V/intake. Has had two falls last month---no recall of incident leading to fall question due to syncope. Was in process of work up for renal disease. She has had issue with LLE weakness with pain for the  past year and being followed by Dr. Ernestina Patches. Did well with stimulator trial.    How long can you sit comfortably? 1 hour    How long can you stand comfortably? 8 mins    How long can you walk comfortably? 20-30 minutes    Currently in Pain? Yes    Pain Score 8     Pain Location Back    Pain Orientation Lower    Pain Descriptors / Indicators Aching    Pain Type Chronic pain    Pain Onset More than a month ago   pt with chronic back pain from over a month ago but with acute pain as well due to recent fall at home   Pain Frequency Intermittent             Patient has received bilateral knee injections last Thursday but has not seen any progress in knee pain. Low back pain continues to be an issue affecting her at home and work.      TREATMENT Hot pack placed to low back with pt in supine for approximately 30 minutes due to pt reports of increased LBP today. Skin checked throughout session with no adverse reaction observed to treatment. Pt also reports hot pack feels "good," on low back.  Transition sitting to supine very painful for patient.    All exercises performed with pt in supine: Single knee to chest 30 second hold each LE piriformis modified stretch 30 seconds each LE Modified hamstring stretch 30 seconds each LE Modified sciatic nerve glides pf/df 20x each LE Adductor squeezes with ball x20; VC for technique Lower trunk rotations (assisted) 2x20. Pt greater motion to R side. AAROM SAQ/knee extension for pain modulation x multiple reps BLEs Assisted knee to chest on RLE 10x past 90 deg hip flexion with pt with muscle tremors along RLE. Discontinued d/t increased pain and RLE tremors. Shaking resolved when pt decreased hip flexion but increased knee flexion.  TA activation with pelvic tilt x15 with 3 second isometric; VC for technique. TrA activation with leg abduction/adduction for core activation with posterior pelvic tilt 10x each LE ; inhale to bring knee out, exhale to  bring knee in RTB hip abduction 3x15; VC/TC for technique Marches with RTB 3x10; VC/TC for technique   transition supine to sit requires mod A and cues for breathing for pain reduction.   Education provided throughout session in the form of VC/TC and demonstration to facilitate correct muscle activation and movement at target joints with all exercises. The pt exhibited good carryover within session after cuing.     Patient is able to tolerate strengthening and muscle tissue lengthening interventions well with no pain increase. Breathing technique for abdominal activation performed with patient demonstrating understanding. The pt will benefit from further skilled therapy to improve mobility, pain, and balance to increase QOL.  PT Education - 12/21/20 1456    Education Details exercise technique, body mechanics, pain reduction    Person(s) Educated Patient    Methods Explanation;Demonstration;Tactile cues;Verbal cues    Comprehension Verbalized understanding;Returned demonstration;Verbal cues required;Tactile cues required            PT Short Term Goals - 11/19/20 0807      PT SHORT TERM GOAL #1   Title Pt will be independent with HEP in order to improve strength and balance in order to decrease fall risk and improve function at home and work.    Baseline 10/14/2020 on-going/deferred; 10/28/2020 ongoing pt has resumed HEP, reports doing what she can/HEP to be advanced; 11/18/2020  Pt has had trouble doing HEP d/t fatigue.    Time 6    Period Weeks    Status On-going    Target Date 12/09/20             PT Long Term Goals - 11/18/20 0953      PT LONG TERM GOAL #1   Title Patient (< 8 years old) will complete five times sit to stand test in < 10 seconds indicating an increased LE strength and improved balance.    Baseline 12/29: 38.45s; 10/14/2020 42.8 seconds; 10/28/2020 36 seconds; 11/18/2020 34.84 seconds    Time 12    Period Weeks    Status  On-going    Target Date 01/20/21      PT LONG TERM GOAL #2   Title Pt will decrease TUG to below 14 seconds/decrease in order to demonstrate decreased fall risk.    Baseline 12/29: 32.94s; 10/14/2020 43.12 sec; 10/28/2020 34.12 seconds; 11/18/2020 26.5 sec    Time 12    Period Weeks    Status On-going    Target Date 01/20/21      PT LONG TERM GOAL #3   Title Pt will decrease DHI score by at least 18 points in order to demonstrate clinically significant reduction in disability    Baseline 12/29: 30; 10/14/2020 62, indicating severe handicap; 10/28/2020 74    Time 12    Period Weeks    Status On-going    Target Date 01/20/21      PT LONG TERM GOAL #4   Title Patient will increase 10 meter walk test to >1.13m/s as to improve gait speed for better community ambulation and to reduce fall risk.    Baseline 12/29: 0.37 m/s; 0.38 m/s; 2/10/08/2020 0.31 m/s 11/18/2020 0.34 m/s with SPC    Time 12    Period Weeks    Status On-going    Target Date 01/20/21      PT LONG TERM GOAL #5   Title Patient will increase FOTO score to equal to or greater than 64 to demonstrate statistically significant improvement in mobility and quality of life.    Baseline 12/29: 52; 10/14/2020 45; 10/28/2020 45; 11/18/2020 45    Time 12    Period Weeks    Status On-going    Target Date 01/20/21                 Plan - 12/21/20 1501    Clinical Impression Statement Patient is able to tolerate strengthening and muscle tissue lengthening interventions well with no pain increase. Breathing technique for abdominal activation performed with patient demonstrating understanding. The pt will benefit from further skilled therapy to improve mobility, pain, and balance to increase QOL.    Personal Factors and Comorbidities Comorbidity 3+;Time since onset of injury/illness/exacerbation  Comorbidities HYPOtension, paraparesis, SCI without spinal bone injury, DDD, cervical cancer, acute kidney injury, GERD, asthma, bradycardia, L  total hip replacement, hypokalemia    Examination-Activity Limitations Squat;Lift;Stairs;Bend;Stand;Reach Overhead;Carry;Transfers    Examination-Participation Restrictions Cleaning;Laundry;Meal Prep;Community Activity;Driving;Occupation    Stability/Clinical Decision Making Evolving/Moderate complexity    Rehab Potential Fair    PT Frequency 3x / week    PT Duration 12 weeks    PT Treatment/Interventions ADLs/Self Care Home Management;Aquatic Therapy;Gait training;Stair training;Functional mobility training;Therapeutic activities;Therapeutic exercise;Balance training;Moist Heat;Manual techniques;Patient/family education;Neuromuscular re-education;Passive range of motion;Energy conservation;Vestibular;Joint Manipulations;Spinal Manipulations    PT Next Visit Plan LE stretching for pain modulation and improved hip mobility; trial treadmill walking, gait training; step-length/gait technique training; nustep for endurance; back mobility therex, progress treadmill endurance and gait training; STM to cervical paraspinals and upper trap/levator scap, continue POC as indicated previously    PT Home Exercise Plan PROGRESS note: add new HEP, initiate UBE bike for cardiovasc endurance, R ankle motor control/strength, focus on STS and ambulation; added 11/18/2020 (handout) hip adduction, thoracic lumbar extension and sit<>stand with counter support    Consulted and Agree with Plan of Care Patient           Patient will benefit from skilled therapeutic intervention in order to improve the following deficits and impairments:  Abnormal gait,Dizziness,Improper body mechanics,Decreased mobility,Cardiopulmonary status limiting activity,Decreased activity tolerance,Decreased endurance,Decreased range of motion,Decreased strength,Decreased balance,Decreased safety awareness,Difficulty walking,Impaired flexibility,Obesity  Visit Diagnosis: Other abnormalities of gait and mobility  Muscle weakness  (generalized)  Acute bilateral low back pain with right-sided sciatica  Chronic midline low back pain, unspecified whether sciatica present     Problem List Patient Active Problem List   Diagnosis Date Noted  . Nerve pain 10/18/2020  . Incomplete paraplegia ([place]) 10/18/2020  . Orthostatic hypotension 10/18/2020  . Chronic migraine without aura without status migrainosus, not intractable   . Hypokalemia   . Adjustment reaction with anxiety and depression   . Spinal cord injury, lumbar, without spinal bone injury, sequela ([place]) 08/18/2020  . Paraparesis ([place])   . Post-operative pain   . Hypotension   . Slow transit constipation   . AKI (acute kidney injury) ([place])   . Right leg weakness 08/11/2020  . DDD (degenerative disc disease), lumbar 09/02/2019    Class: Chronic  . Degenerative disc disease, lumbar 09/02/2019  . Chest tightness 09/23/2018  . Bradycardia 09/23/2018  . Labile blood pressure 03/08/2018  . Chronic low back pain 09/03/2017  . History of total hip replacement, left 01/26/2016  . EDEMA 04/28/2008  . LEG PAIN, BILATERAL 12/16/2007  . CERVICAL CANCER 07/02/2007  . MORBID OBESITY 07/02/2007  . DEPRESSION 07/02/2007  . COMMON MIGRAINE 07/02/2007  . Allergic rhinitis 07/02/2007  . ASTHMA 07/02/2007  . GERD 07/02/2007  . ELEVATED BLOOD PRESSURE WITHOUT DIAGNOSIS OF HYPERTENSION 07/02/2007   Janna Arch, PT, DPT   12/21/2020, 3:31 PM  [place] MAIN [facility] SERVICES [address] [place], [place], [address] Phone: [phone]   Fax:  [phone]  Name: Vanessa Medina MRN: [id number] Date of Birth: 1971-05-10

## 2020-12-22 ENCOUNTER — Other Ambulatory Visit: Payer: Self-pay | Admitting: Radiology

## 2020-12-22 ENCOUNTER — Ambulatory Visit: Payer: 59

## 2020-12-22 MED ORDER — NORTRIPTYLINE HCL 10 MG PO CAPS: 10.0000 mg | ORAL_CAPSULE | Freq: Every day | ORAL | 3 refills | Status: DC

## 2020-12-23 ENCOUNTER — Ambulatory Visit: Payer: 59 | Admitting: Physical Therapy

## 2020-12-23 ENCOUNTER — Encounter: Payer: Self-pay | Admitting: Physical Therapy

## 2020-12-23 ENCOUNTER — Other Ambulatory Visit: Payer: Self-pay

## 2020-12-23 DIAGNOSIS — R2681 Unsteadiness on feet: Secondary | ICD-10-CM

## 2020-12-23 DIAGNOSIS — M5441 Lumbago with sciatica, right side: Secondary | ICD-10-CM

## 2020-12-23 DIAGNOSIS — M6281 Muscle weakness (generalized): Secondary | ICD-10-CM

## 2020-12-23 DIAGNOSIS — R278 Other lack of coordination: Secondary | ICD-10-CM

## 2020-12-23 DIAGNOSIS — R2689 Other abnormalities of gait and mobility: Secondary | ICD-10-CM

## 2020-12-23 DIAGNOSIS — M545 Low back pain, unspecified: Secondary | ICD-10-CM

## 2020-12-23 DIAGNOSIS — G8929 Other chronic pain: Secondary | ICD-10-CM

## 2020-12-23 NOTE — Therapy (Signed)
[place] MAIN [facility] SERVICES [address] [place], [place], [address] Phone: [phone]   Fax:  [phone]  Physical Therapy Treatment  Patient Details  Name: FRANCYS BOLIN MRN: [id number] Date of Birth: 1970-10-01 Referring Provider (PT): Dr. Erling Cruz   Encounter Date: 12/23/2020   PT End of Session - 12/23/20 1457    Visit Number 28    Number of Visits 53    Date for PT Re-Evaluation 01/20/21    Authorization Type eval: 12/29; prog note performed 11/18/2020    PT Start Time 1443    PT Stop Time 1525    PT Time Calculation (min) 42 min    Equipment Utilized During Treatment Gait belt    Activity Tolerance Patient tolerated treatment well    Behavior During Therapy [facility] for tasks assessed/performed           Past Medical History:  Diagnosis Date  . Anemia   . Cervical cancer ([place])   . Family history of adverse reaction to anesthesia     " my mother takes a long time time wake up"  . GERD (gastroesophageal reflux disease)   . Headache    migraine  . History of shingles   . Hypertension   . Migraine   . Multiple allergies   . Obesity   . Osteoarthritis    left hip  . PONV (postoperative nausea and vomiting)   . Sleep apnea    does not wear CPAP  . Wears glasses     Past Surgical History:  Procedure Laterality Date  . ABDOMINAL HYSTERECTOMY    . APPENDECTOMY    . BILIOPANCREATIC DIVERSION     with duodenal switch laparoscopic   . CHOLECYSTECTOMY    . DIAGNOSTIC LAPAROSCOPY     LOA  . ESOPHAGOGASTRODUODENOSCOPY (EGD) WITH PROPOFOL N/A 07/25/2017   Procedure: ESOPHAGOGASTRODUODENOSCOPY (EGD) WITH PROPOFOL;  Surgeon: Manya Silvas, MD;  Location: [facility] ENDOSCOPY;  Service: Endoscopy;  Laterality: N/A;  . KNEE ARTHROSCOPY    . LAPAROSCOPIC GASTRIC SLEEVE RESECTION WITH HIATAL HERNIA REPAIR    . TONSILLECTOMY    . TOTAL HIP ARTHROPLASTY Left 01/26/2016   Procedure: LEFT TOTAL HIP ARTHROPLASTY ANTERIOR APPROACH;   Surgeon: Leandrew Koyanagi, MD;  Location: [facility];  Service: Orthopedics;  Laterality: Left;  . TRANSFORAMINAL LUMBAR INTERBODY FUSION (TLIF) WITH PEDICLE SCREW FIXATION 3 LEVEL  08/2019   Basil Dess, MD L2-L5    There were no vitals filed for this visit.   Subjective Assessment - 12/23/20 1455    Subjective Patient reports her back pain and bilateral knee pain are really bothering her. She reports her knee injections haven't helped yet. She denies any recent falls. She reports getting some relief from vestibular rehab for short duration but then her symptoms return. "nothing seems like its going to get better."    Pertinent History 50 year old female with history of HTN, migraines, morbid obesity--BMI-41, chronic hypotension, multiple back surgeries with chronic pain with LLE lumbar radiculopathy who was scheduled to have spinal cord stimulator placed by Dr. Davy Pique on 08/11/2020 but unable to perform surgery due to scar tissue.  History taken from patient and chart review.  Post op on awakening, she had excruciating pain RLE with plegia and and allodynia. She was admitted for work up by Dr. Ronnald Ramp from surgical center on 08/11/2020. Thoracic MRI done revealing subtle increased T2/STIR intensity distal cord at T12-L1 level suspicious for edema/acute spinal cord injury, no CSF leak as  well as prior PLIF L2-L5 with residual foraminal protrusion L3/5 potentially irritating right L3 nerve. She was started on IV Dilaudid, gabapentin as well as IV Decadron for management of pain, headaches and severe neuropathy.  She continues to have pain in RLE but is having some motor return, has constant headache, decreased hearing in right> left ear, constipation with nausea as well as epigastric pain and weakness. Therapy ongoing and CIR recommended due to functional decline.  Of note, she reports 51 lbs weigh loss in the past 3-4 months--due to issues with N/V/intake. Has had two falls last month---no recall of incident leading  to fall question due to syncope. Was in process of work up for renal disease. She has had issue with LLE weakness with pain for the past year and being followed by Dr. Ernestina Patches. Did well with stimulator trial.    How long can you sit comfortably? 1 hour    How long can you stand comfortably? 8 mins    How long can you walk comfortably? 20-30 minutes    Currently in Pain? Yes    Pain Score 8     Pain Location Back    Pain Orientation Lower    Pain Descriptors / Indicators Aching    Pain Type Chronic pain    Pain Onset More than a month ago   pt with chronic back pain from over a month ago but with acute pain as well due to recent fall at home   Pain Frequency Intermittent    Aggravating Factors  bending, transfers, walking/standing, lifting    Pain Relieving Factors rest/heat/flexion exercise- short term relief    Effect of Pain on Daily Activities decreased activity tolerance;    Multiple Pain Sites Yes    Pain Score 10    Pain Location Knee    Pain Type Chronic pain             Patient has received bilateral knee injections last Thursday but has not seen any progress in knee pain. Low back pain continues to be an issue affecting her at home and work.     TREATMENT Hot pack placed to low back with pt in supine for approximately 35 minutes concurrent with hooklying exercise. due to pt reports of increased LBP today. Skin checked throughout session with no adverse reaction observed to treatment. Pt also reports hot pack feels "good," on low back.   Transition sitting to supine very painful for patient.    All exercises performed with pt in supine: Single knee to chest 30 second hold x2 reps each LE Modified hamstring stretch 30 seconds each LE Modified sciatic nerve glides pf/df 20x each LE Adductor squeezes with ball x15; VC for technique Lower trunk rotations 2x10. Pt greater motion to R side. Cues to avoid painful ROM;  TA activation with pelvic tilt x10 with 5 second isometric;  VC for technique. TA activation with pelvic tilt with alternate march x10 reps each LE with cues to avoid painful ROM;  -TA activation with BUE press through ball for increased core contraction 3 sec hold x10 reps;  RTB hip abduction  x20; VC/TC for technique RTB hip flexion march x20 reps with cues for core stabilization to reduce strain;     transition supine to sit requires mod A and cues for breathing for pain reduction.    Education provided throughout session in the form of VC/TC and demonstration to facilitate correct muscle activation and movement at target joints with all exercises. The  pt exhibited good carryover within session after cuing.     Patient is able to tolerate strengthening and muscle tissue lengthening interventions well with no pain increase. Breathing technique for abdominal activation performed with patient demonstrating understanding. The pt will benefit from further skilled therapy to improve mobility, pain, and balance to increase QOL.                         PT Education - 12/23/20 1457    Education Details exercise technique/positioning, HEP    Person(s) Educated Patient    Methods Explanation;Verbal cues    Comprehension Verbalized understanding;Returned demonstration;Verbal cues required;Need further instruction            PT Short Term Goals - 11/19/20 0807      PT SHORT TERM GOAL #1   Title Pt will be independent with HEP in order to improve strength and balance in order to decrease fall risk and improve function at home and work.    Baseline 10/14/2020 on-going/deferred; 10/28/2020 ongoing pt has resumed HEP, reports doing what she can/HEP to be advanced; 11/18/2020  Pt has had trouble doing HEP d/t fatigue.    Time 6    Period Weeks    Status On-going    Target Date 12/09/20             PT Long Term Goals - 11/18/20 0953      PT LONG TERM GOAL #1   Title Patient (< 55 years old) will complete five times sit to stand test  in < 10 seconds indicating an increased LE strength and improved balance.    Baseline 12/29: 38.45s; 10/14/2020 42.8 seconds; 10/28/2020 36 seconds; 11/18/2020 34.84 seconds    Time 12    Period Weeks    Status On-going    Target Date 01/20/21      PT LONG TERM GOAL #2   Title Pt will decrease TUG to below 14 seconds/decrease in order to demonstrate decreased fall risk.    Baseline 12/29: 32.94s; 10/14/2020 43.12 sec; 10/28/2020 34.12 seconds; 11/18/2020 26.5 sec    Time 12    Period Weeks    Status On-going    Target Date 01/20/21      PT LONG TERM GOAL #3   Title Pt will decrease DHI score by at least 18 points in order to demonstrate clinically significant reduction in disability    Baseline 12/29: 30; 10/14/2020 62, indicating severe handicap; 10/28/2020 74    Time 12    Period Weeks    Status On-going    Target Date 01/20/21      PT LONG TERM GOAL #4   Title Patient will increase 10 meter walk test to >1.70m/s as to improve gait speed for better community ambulation and to reduce fall risk.    Baseline 12/29: 0.37 m/s; 0.38 m/s; 2/10/08/2020 0.31 m/s 11/18/2020 0.34 m/s with SPC    Time 12    Period Weeks    Status On-going    Target Date 01/20/21      PT LONG TERM GOAL #5   Title Patient will increase FOTO score to equal to or greater than 64 to demonstrate statistically significant improvement in mobility and quality of life.    Baseline 12/29: 52; 10/14/2020 45; 10/28/2020 45; 11/18/2020 45    Time 12    Period Weeks    Status On-going    Target Date 01/20/21  Plan - 12/23/20 1516    Clinical Impression Statement Patient motivated and tolerated session fair. She continues to have high pain levels which limits exercise tolerance. Patient was instructed in advanced core stabilizationl/lumbar flexion exercise. She does require increased time to do exercise due to elevated pain. Patient does require minVCs for proper positioning and exercise technique. Reinforced  HEP with instruction to do gentle exercise to tolerance and avoid prolonged sitting/standing. Patient would benefit from additional skilled PT intervention to improve strength, balance and mobility while reducing back pain;    Personal Factors and Comorbidities Comorbidity 3+;Time since onset of injury/illness/exacerbation    Comorbidities HYPOtension, paraparesis, SCI without spinal bone injury, DDD, cervical cancer, acute kidney injury, GERD, asthma, bradycardia, L total hip replacement, hypokalemia    Examination-Activity Limitations Squat;Lift;Stairs;Bend;Stand;Reach Overhead;Carry;Transfers    Examination-Participation Restrictions Cleaning;Laundry;Meal Prep;Community Activity;Driving;Occupation    Stability/Clinical Decision Making Evolving/Moderate complexity    Rehab Potential Fair    PT Frequency 3x / week    PT Duration 12 weeks    PT Treatment/Interventions ADLs/Self Care Home Management;Aquatic Therapy;Gait training;Stair training;Functional mobility training;Therapeutic activities;Therapeutic exercise;Balance training;Moist Heat;Manual techniques;Patient/family education;Neuromuscular re-education;Passive range of motion;Energy conservation;Vestibular;Joint Manipulations;Spinal Manipulations    PT Next Visit Plan LE stretching for pain modulation and improved hip mobility; trial treadmill walking, gait training; step-length/gait technique training; nustep for endurance; back mobility therex, progress treadmill endurance and gait training; STM to cervical paraspinals and upper trap/levator scap, continue POC as indicated previously    PT Home Exercise Plan PROGRESS note: add new HEP, initiate UBE bike for cardiovasc endurance, R ankle motor control/strength, focus on STS and ambulation; added 11/18/2020 (handout) hip adduction, thoracic lumbar extension and sit<>stand with counter support    Consulted and Agree with Plan of Care Patient           Patient will benefit from skilled  therapeutic intervention in order to improve the following deficits and impairments:  Abnormal gait,Dizziness,Improper body mechanics,Decreased mobility,Cardiopulmonary status limiting activity,Decreased activity tolerance,Decreased endurance,Decreased range of motion,Decreased strength,Decreased balance,Decreased safety awareness,Difficulty walking,Impaired flexibility,Obesity  Visit Diagnosis: Other abnormalities of gait and mobility  Muscle weakness (generalized)  Acute bilateral low back pain with right-sided sciatica  Chronic midline low back pain, unspecified whether sciatica present  Unsteadiness on feet  Other lack of coordination     Problem List Patient Active Problem List   Diagnosis Date Noted  . Nerve pain 10/18/2020  . Incomplete paraplegia ([place]) 10/18/2020  . Orthostatic hypotension 10/18/2020  . Chronic migraine without aura without status migrainosus, not intractable   . Hypokalemia   . Adjustment reaction with anxiety and depression   . Spinal cord injury, lumbar, without spinal bone injury, sequela ([place]) 08/18/2020  . Paraparesis ([place])   . Post-operative pain   . Hypotension   . Slow transit constipation   . AKI (acute kidney injury) ([place])   . Right leg weakness 08/11/2020  . DDD (degenerative disc disease), lumbar 09/02/2019    Class: Chronic  . Degenerative disc disease, lumbar 09/02/2019  . Chest tightness 09/23/2018  . Bradycardia 09/23/2018  . Labile blood pressure 03/08/2018  . Chronic low back pain 09/03/2017  . History of total hip replacement, left 01/26/2016  . EDEMA 04/28/2008  . LEG PAIN, BILATERAL 12/16/2007  . CERVICAL CANCER 07/02/2007  . MORBID OBESITY 07/02/2007  . DEPRESSION 07/02/2007  . COMMON MIGRAINE 07/02/2007  . Allergic rhinitis 07/02/2007  . ASTHMA 07/02/2007  . GERD 07/02/2007  . ELEVATED BLOOD PRESSURE WITHOUT DIAGNOSIS OF HYPERTENSION 07/02/2007    Chrisanne Loose PT,  DPT 12/23/2020, 3:31 PM  [place] MAIN [facility] SERVICES [address] [place], [place], [address] Phone: [phone]   Fax:  [phone]  Name: HILLARY STRUSS MRN: [id number] Date of Birth: [birth year]

## 2020-12-26 ENCOUNTER — Other Ambulatory Visit: Payer: Self-pay | Admitting: Specialist

## 2020-12-27 ENCOUNTER — Ambulatory Visit: Payer: 59

## 2020-12-28 ENCOUNTER — Ambulatory Visit: Payer: 59 | Admitting: Physical Therapy

## 2020-12-28 ENCOUNTER — Encounter: Payer: Self-pay | Admitting: Physical Therapy

## 2020-12-28 ENCOUNTER — Other Ambulatory Visit: Payer: Self-pay

## 2020-12-28 DIAGNOSIS — R2689 Other abnormalities of gait and mobility: Secondary | ICD-10-CM

## 2020-12-28 DIAGNOSIS — M545 Low back pain, unspecified: Secondary | ICD-10-CM

## 2020-12-28 DIAGNOSIS — M6281 Muscle weakness (generalized): Secondary | ICD-10-CM | POA: Diagnosis not present

## 2020-12-28 DIAGNOSIS — R278 Other lack of coordination: Secondary | ICD-10-CM

## 2020-12-28 DIAGNOSIS — M5441 Lumbago with sciatica, right side: Secondary | ICD-10-CM

## 2020-12-28 DIAGNOSIS — G8929 Other chronic pain: Secondary | ICD-10-CM

## 2020-12-28 DIAGNOSIS — R2681 Unsteadiness on feet: Secondary | ICD-10-CM

## 2020-12-28 NOTE — Therapy (Signed)
[place] MAIN [facility] SERVICES [address] [place], [place], [address] Phone: [phone]   Fax:  [phone]  Physical Therapy Treatment  Patient Details  Name: Vanessa Medina MRN: [id number] Date of Birth: [birth year] Referring Provider (PT): Dr. Erling Cruz   Encounter Date: 12/28/2020   PT End of Session - 12/28/20 1452    Visit Number 29    Number of Visits 53    Date for PT Re-Evaluation 01/20/21    Authorization Type eval: 12/29; prog note performed 11/18/2020    PT Start Time 2778    PT Stop Time 1528    PT Time Calculation (min) 43 min    Equipment Utilized During Treatment Gait belt    Activity Tolerance Patient tolerated treatment well    Behavior During Therapy [facility] for tasks assessed/performed           Past Medical History:  Diagnosis Date  . Anemia   . Cervical cancer ([place])   . Family history of adverse reaction to anesthesia     " my mother takes a long time time wake up"  . GERD (gastroesophageal reflux disease)   . Headache    migraine  . History of shingles   . Hypertension   . Migraine   . Multiple allergies   . Obesity   . Osteoarthritis    left hip  . PONV (postoperative nausea and vomiting)   . Sleep apnea    does not wear CPAP  . Wears glasses     Past Surgical History:  Procedure Laterality Date  . ABDOMINAL HYSTERECTOMY    . APPENDECTOMY    . BILIOPANCREATIC DIVERSION     with duodenal switch laparoscopic   . CHOLECYSTECTOMY    . DIAGNOSTIC LAPAROSCOPY     LOA  . ESOPHAGOGASTRODUODENOSCOPY (EGD) WITH PROPOFOL N/A 07/25/2017   Procedure: ESOPHAGOGASTRODUODENOSCOPY (EGD) WITH PROPOFOL;  Surgeon: Manya Silvas, MD;  Location: [facility] ENDOSCOPY;  Service: Endoscopy;  Laterality: N/A;  . KNEE ARTHROSCOPY    . LAPAROSCOPIC GASTRIC SLEEVE RESECTION WITH HIATAL HERNIA REPAIR    . TONSILLECTOMY    . TOTAL HIP ARTHROPLASTY Left 01/26/2016   Procedure: LEFT TOTAL HIP ARTHROPLASTY ANTERIOR APPROACH;   Surgeon: Leandrew Koyanagi, MD;  Location: [facility];  Service: Orthopedics;  Laterality: Left;  . TRANSFORAMINAL LUMBAR INTERBODY FUSION (TLIF) WITH PEDICLE SCREW FIXATION 3 LEVEL  08/2019   Basil Dess, MD L2-L5    There were no vitals filed for this visit.   Subjective Assessment - 12/28/20 1451    Subjective Patient reports continued back pain. She also reports increased tremors in BUE hands;    Pertinent History 50 year old female with history of HTN, migraines, morbid obesity--BMI-41, chronic hypotension, multiple back surgeries with chronic pain with LLE lumbar radiculopathy who was scheduled to have spinal cord stimulator placed by Dr. Davy Pique on 08/11/2020 but unable to perform surgery due to scar tissue.  History taken from patient and chart review.  Post op on awakening, she had excruciating pain RLE with plegia and and allodynia. She was admitted for work up by Dr. Ronnald Ramp from surgical center on 08/11/2020. Thoracic MRI done revealing subtle increased T2/STIR intensity distal cord at T12-L1 level suspicious for edema/acute spinal cord injury, no CSF leak as well as prior PLIF L2-L5 with residual foraminal protrusion L3/5 potentially irritating right L3 nerve. She was started on IV Dilaudid, gabapentin as well as IV Decadron for management of pain, headaches and severe neuropathy.  She  continues to have pain in RLE but is having some motor return, has constant headache, decreased hearing in right> left ear, constipation with nausea as well as epigastric pain and weakness. Therapy ongoing and CIR recommended due to functional decline.  Of note, she reports 50 lbs weigh loss in the past 3-4 months--due to issues with N/V/intake. Has had two falls last month---no recall of incident leading to fall question due to syncope. Was in process of work up for renal disease. She has had issue with LLE weakness with pain for the past year and being followed by Dr. Ernestina Patches. Did well with stimulator trial.    How long can  you sit comfortably? 1 hour    How long can you stand comfortably? 8 mins    How long can you walk comfortably? 20-30 minutes    Currently in Pain? Yes    Pain Score 8     Pain Location Back    Pain Orientation Lower    Pain Descriptors / Indicators Aching;Sore    Pain Type Chronic pain    Pain Onset More than a month ago   pt with chronic back pain from over a month ago but with acute pain as well due to recent fall at home   Pain Frequency Intermittent    Aggravating Factors  bending/transfers/walking and standing    Pain Relieving Factors rest/heat/flexion exercise    Effect of Pain on Daily Activities decreased activity tolerance;              TREATMENT: Warm up on Nustep BUE/BLE level 1 x5 min concurrent with moist heat to low back for comfort; Patient required cues for proper positioning and to avoid painful ROM;   Instructed patient in seated exercise for UE/LE strengthening:  Seated with moist heat to low back for comfort: Seated with 2.5# ankle weight:  -push through pball with core stabilization 5 sec hold x10 reps;  -hip flexion march x15 reps; -LAQ x10 reps each LE with cues to avoid painful ROM;  -small ball squeeze 5 sec hold x10 reps  - BUE red tband low rows x15 reps with cues for proper positioning for postural strengthening;   Seated BLE hip abduction/ER green tband x15 reps;   Finished with gait in gym with [place] x80 feet with CGA for safety and cues for erect posture, increase step length to tolerance and improve heel strike for better gait safety;   Patient tolerated session well. She does report increased fatigue with advanced exercise. Patient denies any increase in back pain. Reinforced HEP;                        PT Education - 12/28/20 1452    Education Details exercise technique/positioning, HEP    Person(s) Educated Patient    Methods Explanation;Verbal cues    Comprehension Verbalized understanding;Returned  demonstration;Verbal cues required;Need further instruction            PT Short Term Goals - 11/19/20 0807      PT SHORT TERM GOAL #1   Title Pt will be independent with HEP in order to improve strength and balance in order to decrease fall risk and improve function at home and work.    Baseline 10/14/2020 on-going/deferred; 10/28/2020 ongoing pt has resumed HEP, reports doing what she can/HEP to be advanced; 11/18/2020  Pt has had trouble doing HEP d/t fatigue.    Time 6    Period Weeks    Status  On-going    Target Date 12/09/20             PT Long Term Goals - 11/18/20 0953      PT LONG TERM GOAL #1   Title Patient (< 37 years old) will complete five times sit to stand test in < 10 seconds indicating an increased LE strength and improved balance.    Baseline 12/29: 38.45s; 10/14/2020 42.8 seconds; 10/28/2020 36 seconds; 11/18/2020 34.84 seconds    Time 12    Period Weeks    Status On-going    Target Date 01/20/21      PT LONG TERM GOAL #2   Title Pt will decrease TUG to below 14 seconds/decrease in order to demonstrate decreased fall risk.    Baseline 12/29: 32.94s; 10/14/2020 43.12 sec; 10/28/2020 34.12 seconds; 11/18/2020 26.5 sec    Time 12    Period Weeks    Status On-going    Target Date 01/20/21      PT LONG TERM GOAL #3   Title Pt will decrease DHI score by at least 18 points in order to demonstrate clinically significant reduction in disability    Baseline 12/29: 30; 10/14/2020 62, indicating severe handicap; 10/28/2020 74    Time 12    Period Weeks    Status On-going    Target Date 01/20/21      PT LONG TERM GOAL #4   Title Patient will increase 10 meter walk test to >1.18m/s as to improve gait speed for better community ambulation and to reduce fall risk.    Baseline 12/29: 0.37 m/s; 0.38 m/s; 2/10/08/2020 0.31 m/s 11/18/2020 0.34 m/s with SPC    Time 12    Period Weeks    Status On-going    Target Date 01/20/21      PT LONG TERM GOAL #5   Title Patient will  increase FOTO score to equal to or greater than 64 to demonstrate statistically significant improvement in mobility and quality of life.    Baseline 12/29: 52; 10/14/2020 45; 10/28/2020 45; 11/18/2020 45    Time 12    Period Weeks    Status On-going    Target Date 01/20/21                 Plan - 12/28/20 1522    Clinical Impression Statement Patient motivated and tolerated session fair. She was instructed in seated exercise to challenge LE strength in upright posture. Patient does require min VCs for proper positioning for optimal muscle activation. Patient reports increased fatigue but denies any increase in back pain; Patient was able to walk short distance in gym with [facility] with CGA for safety. She does require cues for proper gait mechanics for better safety and mobility. She would benefit from additional skilled PT intervention to improve strength, balance and mobility; Plan to progress seated and standing exercise to tolerance;    Personal Factors and Comorbidities Comorbidity 3+;Time since onset of injury/illness/exacerbation    Comorbidities HYPOtension, paraparesis, SCI without spinal bone injury, DDD, cervical cancer, acute kidney injury, GERD, asthma, bradycardia, L total hip replacement, hypokalemia    Examination-Activity Limitations Squat;Lift;Stairs;Bend;Stand;Reach Overhead;Carry;Transfers    Examination-Participation Restrictions Cleaning;Laundry;Meal Prep;Community Activity;Driving;Occupation    Stability/Clinical Decision Making Evolving/Moderate complexity    Rehab Potential Fair    PT Frequency 3x / week    PT Duration 12 weeks    PT Treatment/Interventions ADLs/Self Care Home Management;Aquatic Therapy;Gait training;Stair training;Functional mobility training;Therapeutic activities;Therapeutic exercise;Balance training;Moist Heat;Manual techniques;Patient/family education;Neuromuscular re-education;Passive range of motion;Energy conservation;Vestibular;Joint  Manipulations;Spinal Manipulations    PT Next Visit Plan LE stretching for pain modulation and improved hip mobility; trial treadmill walking, gait training; step-length/gait technique training; nustep for endurance; back mobility therex, progress treadmill endurance and gait training; STM to cervical paraspinals and upper trap/levator scap, continue POC as indicated previously    PT Home Exercise Plan PROGRESS note: add new HEP, initiate UBE bike for cardiovasc endurance, R ankle motor control/strength, focus on STS and ambulation; added 11/18/2020 (handout) hip adduction, thoracic lumbar extension and sit<>stand with counter support    Consulted and Agree with Plan of Care Patient           Patient will benefit from skilled therapeutic intervention in order to improve the following deficits and impairments:  Abnormal gait,Dizziness,Improper body mechanics,Decreased mobility,Cardiopulmonary status limiting activity,Decreased activity tolerance,Decreased endurance,Decreased range of motion,Decreased strength,Decreased balance,Decreased safety awareness,Difficulty walking,Impaired flexibility,Obesity  Visit Diagnosis: Other abnormalities of gait and mobility  Muscle weakness (generalized)  Acute bilateral low back pain with right-sided sciatica  Chronic midline low back pain, unspecified whether sciatica present  Unsteadiness on feet  Other lack of coordination     Problem List Patient Active Problem List   Diagnosis Date Noted  . Nerve pain 10/18/2020  . Incomplete paraplegia ([place]) 10/18/2020  . Orthostatic hypotension 10/18/2020  . Chronic migraine without aura without status migrainosus, not intractable   . Hypokalemia   . Adjustment reaction with anxiety and depression   . Spinal cord injury, lumbar, without spinal bone injury, sequela ([place]) 08/18/2020  . Paraparesis ([place])   . Post-operative pain   . Hypotension   . Slow transit constipation   . AKI (acute kidney injury)  ([place])   . Right leg weakness 08/11/2020  . DDD (degenerative disc disease), lumbar 09/02/2019    Class: Chronic  . Degenerative disc disease, lumbar 09/02/2019  . Chest tightness 09/23/2018  . Bradycardia 09/23/2018  . Labile blood pressure 03/08/2018  . Chronic low back pain 09/03/2017  . History of total hip replacement, left 01/26/2016  . EDEMA 04/28/2008  . LEG PAIN, BILATERAL 12/16/2007  . CERVICAL CANCER 07/02/2007  . MORBID OBESITY 07/02/2007  . DEPRESSION 07/02/2007  . COMMON MIGRAINE 07/02/2007  . Allergic rhinitis 07/02/2007  . ASTHMA 07/02/2007  . GERD 07/02/2007  . ELEVATED BLOOD PRESSURE WITHOUT DIAGNOSIS OF HYPERTENSION 07/02/2007    Janita Camberos PT,DPT 12/28/2020, 3:33 PM  [place] MAIN [facility] SERVICES [address] [place], [place], [address] Phone: [phone]   Fax:  [phone]  Name: Vanessa Medina MRN: [id number] Date of Birth: 23-Feb-1971

## 2020-12-29 ENCOUNTER — Ambulatory Visit: Payer: 59

## 2020-12-30 ENCOUNTER — Other Ambulatory Visit: Payer: Self-pay

## 2020-12-30 ENCOUNTER — Ambulatory Visit: Payer: 59

## 2020-12-30 VITALS — BP 108/64 | HR 63

## 2020-12-30 DIAGNOSIS — G8929 Other chronic pain: Secondary | ICD-10-CM

## 2020-12-30 DIAGNOSIS — M545 Low back pain, unspecified: Secondary | ICD-10-CM

## 2020-12-30 DIAGNOSIS — R262 Difficulty in walking, not elsewhere classified: Secondary | ICD-10-CM

## 2020-12-30 DIAGNOSIS — R278 Other lack of coordination: Secondary | ICD-10-CM

## 2020-12-30 DIAGNOSIS — S34109S Unspecified injury to unspecified level of lumbar spinal cord, sequela: Secondary | ICD-10-CM

## 2020-12-30 DIAGNOSIS — G903 Multi-system degeneration of the autonomic nervous system: Secondary | ICD-10-CM

## 2020-12-30 DIAGNOSIS — M5441 Lumbago with sciatica, right side: Secondary | ICD-10-CM

## 2020-12-30 DIAGNOSIS — M5442 Lumbago with sciatica, left side: Secondary | ICD-10-CM

## 2020-12-30 DIAGNOSIS — R2689 Other abnormalities of gait and mobility: Secondary | ICD-10-CM

## 2020-12-30 DIAGNOSIS — R296 Repeated falls: Secondary | ICD-10-CM

## 2020-12-30 DIAGNOSIS — M6281 Muscle weakness (generalized): Secondary | ICD-10-CM

## 2020-12-30 DIAGNOSIS — R2681 Unsteadiness on feet: Secondary | ICD-10-CM

## 2020-12-30 NOTE — Therapy (Signed)
[place] MAIN [place] [address] [place], [place], [address] Phone: [phone]   Fax:  [phone]  Physical Therapy Treatment Physical Therapy Progress Note   Dates of reporting period   11/18/20  to   12/30/20   Patient Details  Name: Vanessa Medina MRN: [id number] Date of Birth: 03-Jul-1971 Referring Provider (PT): Dr. Erling Cruz   Encounter Date: 12/30/2020   PT End of Session - 12/30/20 1327    Visit Number 30    Number of Visits 66    Date for PT Re-Evaluation 01/20/21    Authorization Type UHC Other    Authorization Time Period 10/28/20-01/20/21    PT Start Time 1301    PT Stop Time 1341    PT Time Calculation (min) 40 min    Equipment Utilized During Treatment Gait belt    Activity Tolerance Patient tolerated treatment well;No increased pain;Patient limited by fatigue    Behavior During Therapy [facility] for tasks assessed/performed           Past Medical History:  Diagnosis Date  . Anemia   . Cervical cancer ([place])   . Family history of adverse reaction to anesthesia     " my mother takes a long time time wake up"  . GERD (gastroesophageal reflux disease)   . Headache    migraine  . History of shingles   . Hypertension   . Migraine   . Multiple allergies   . Obesity   . Osteoarthritis    left hip  . PONV (postoperative nausea and vomiting)   . Sleep apnea    does not wear CPAP  . Wears glasses     Past Surgical History:  Procedure Laterality Date  . ABDOMINAL HYSTERECTOMY    . APPENDECTOMY    . BILIOPANCREATIC DIVERSION     with duodenal switch laparoscopic   . CHOLECYSTECTOMY    . DIAGNOSTIC LAPAROSCOPY     LOA  . ESOPHAGOGASTRODUODENOSCOPY (EGD) WITH PROPOFOL N/A 07/25/2017   Procedure: ESOPHAGOGASTRODUODENOSCOPY (EGD) WITH PROPOFOL;  Surgeon: Manya Silvas, MD;  Location: [facility] ENDOSCOPY;  Service: Endoscopy;  Laterality: N/A;  . KNEE ARTHROSCOPY    . LAPAROSCOPIC GASTRIC SLEEVE RESECTION WITH  HIATAL HERNIA REPAIR    . TONSILLECTOMY    . TOTAL HIP ARTHROPLASTY Left 01/26/2016   Procedure: LEFT TOTAL HIP ARTHROPLASTY ANTERIOR APPROACH;  Surgeon: Leandrew Koyanagi, MD;  Location: [facility];  Service: Orthopedics;  Laterality: Left;  . TRANSFORAMINAL LUMBAR INTERBODY FUSION (TLIF) WITH PEDICLE SCREW FIXATION 3 LEVEL  08/2019   Basil Dess, MD L2-L5    Vitals:   12/30/20 1307  BP: 108/64  Pulse: 63     Subjective Assessment - 12/30/20 1307    Subjective Pt reports doing well in general, kinda blah day. She denies any medication or medical updates. Reports she walked into a wall Tuesday night and significantly injured her Right digit indicis, now wrapped, swollen, and bruised- she does not feel like seeing the doctor for this right now.    Pertinent History 51 year old female with history of HTN, migraines, morbid obesity--BMI-41, chronic hypotension, multiple back surgeries with chronic pain with LLE lumbar radiculopathy who was scheduled to have spinal cord stimulator placed by Dr. Davy Pique on 08/11/2020 but unable to perform surgery due to scar tissue.  History taken from patient and chart review.  Post op on awakening, she had excruciating pain RLE with plegia and and allodynia. She was admitted for work up by  Dr. Ronnald Ramp from surgical center on 08/11/2020. Thoracic MRI done revealing subtle increased T2/STIR intensity distal cord at T12-L1 level suspicious for edema/acute spinal cord injury, no CSF leak as well as prior PLIF L2-L5 with residual foraminal protrusion L3/5 potentially irritating right L3 nerve. She was started on IV Dilaudid, gabapentin as well as IV Decadron for management of pain, headaches and severe neuropathy.  She continues to have pain in RLE but is having some motor return, has constant headache, decreased hearing in right> left ear, constipation with nausea as well as epigastric pain and weakness. Therapy ongoing and CIR recommended due to functional decline.  Of note, she reports  50 lbs weigh loss in the past 3-4 months--due to issues with N/V/intake. Has had two falls last month---no recall of incident leading to fall question due to syncope. Was in process of work up for renal disease. She has had issue with LLE weakness with pain for the past year and being followed by Dr. Ernestina Patches. Did well with stimulator trial.    How long can you sit comfortably? 1 hour (no recent change)    How long can you stand comfortably? 8 mins (no recent change)    How long can you walk comfortably? 20-30 minutes (no recent change)    Currently in Pain? Yes    Pain Score 7     Pain Orientation Lower               INTERVENTION THIS DATE: -FOTO survey -5xSTS -TUG -10MWT  -10x STS from chair+airex hands ad lib -119ft sustain AMB/gait training with Rt SPC and minGuard assist 4 minutes 25 seconds     PT Short Term Goals - 11/19/20 0807      PT SHORT TERM GOAL #1   Title Pt will be independent with HEP in order to improve strength and balance in order to decrease fall risk and improve function at home and work.    Baseline 10/14/2020 on-going/deferred; 10/28/2020 ongoing pt has resumed HEP, reports doing what she can/HEP to be advanced; 11/18/2020  Pt has had trouble doing HEP d/t fatigue.    Time 6    Period Weeks    Status On-going    Target Date 12/09/20             PT Long Term Goals - 12/30/20 1311      PT LONG TERM GOAL #1   Title Patient (< 17 years old) will complete five times sit to stand test in < 10 seconds indicating an increased LE strength and improved balance.    Baseline 12/29: 38.45s; 10/14/2020 42.8 seconds; 10/28/2020 36 seconds; 11/18/2020 34.84 seconds; 12/30/20 41.55sec    Time 12    Period Weeks    Status On-going    Target Date 01/20/21      PT LONG TERM GOAL #2   Title Pt will decrease TUG to below 14 seconds/decrease in order to demonstrate decreased fall risk.    Baseline 12/29: 32.94s; 10/14/2020 43.12 sec; 10/28/2020 34.12 seconds; 11/18/2020  26.5 sec; 12/30/20: 31sec c SPC    Time 12    Period Weeks    Status On-going    Target Date 01/20/21      PT LONG TERM GOAL #3   Title Pt will decrease DHI score by at least 18 points in order to demonstrate clinically significant reduction in disability    Baseline 12/29: 30; 10/14/2020 62, indicating severe handicap; 10/28/2020 74   Not reassessed, but reports no significant change/improvement  with dizziness   Time 12    Period Weeks    Status On-going    Target Date 01/20/21      PT LONG TERM GOAL #4   Title Patient will increase 10 meter walk test to >1.75m/s as to improve gait speed for better community ambulation and to reduce fall risk.    Baseline 12/29: 0.37 m/s; 0.38 m/s; 2/10/08/2020 0.31 m/s 11/18/2020 0.34 m/s with SPC; 0.16m/s 12/30/20    Time 12    Period Weeks    Status On-going    Target Date 01/20/21      PT LONG TERM GOAL #5   Title Patient will increase FOTO score to equal to or greater than 64 to demonstrate statistically significant improvement in mobility and quality of life.    Baseline 12/29: 52; 10/14/2020 45; 10/28/2020 45; 11/18/2020 45; 12/30/20 44    Time 12    Period Weeks    Status On-going    Target Date 01/20/21                 Plan - 12/30/20 1334    Clinical Impression Statement Reassessment this date: FOTO score, TUG, 10MWT, 5xSTS all essentially unchanged since prior assessment. Pt reports feeling generally poor today, increases malaised. She reports her vertigo and unsteadiness continue to severely limit all that she does. Pt remains motivated to continue to progress toward her goals of treatment. Pt will benefit from skilled PT intervention to maximize tolerance, independence, and safety in performance of ADL, IADL, and work duties.    Personal Factors and Comorbidities Comorbidity 3+;Time since onset of injury/illness/exacerbation    Comorbidities HYPOtension, paraparesis, SCI without spinal bone injury, DDD, cervical cancer, acute kidney  injury, GERD, asthma, bradycardia, L total hip replacement, hypokalemia    Examination-Activity Limitations Squat;Lift;Stairs;Bend;Stand;Reach Overhead;Carry;Transfers    Examination-Participation Restrictions Cleaning;Laundry;Meal Prep;Community Activity;Driving;Occupation    Stability/Clinical Decision Making Evolving/Moderate complexity    Clinical Decision Making Moderate    Rehab Potential Fair    PT Frequency 3x / week    PT Duration 12 weeks    PT Treatment/Interventions ADLs/Self Care Home Management;Aquatic Therapy;Gait training;Stair training;Functional mobility training;Therapeutic activities;Therapeutic exercise;Balance training;Moist Heat;Manual techniques;Patient/family education;Neuromuscular re-education;Passive range of motion;Energy conservation;Vestibular;Joint Manipulations;Spinal Manipulations    PT Next Visit Plan LE stretching for pain modulation and improved hip mobility; trial treadmill walking, gait training; step-length/gait technique training; nustep for endurance; back mobility therex, progress treadmill endurance and gait training; STM to cervical paraspinals and upper trap/levator scap, continue POC as indicated previously    PT Home Exercise Plan PROGRESS note: add new HEP, initiate UBE bike for cardiovasc endurance, R ankle motor control/strength, focus on STS and ambulation; added 11/18/2020 (handout) hip adduction, thoracic lumbar extension and sit<>stand with counter support    Consulted and Agree with Plan of Care Patient           Patient will benefit from skilled therapeutic intervention in order to improve the following deficits and impairments:  Abnormal gait,Dizziness,Improper body mechanics,Decreased mobility,Cardiopulmonary status limiting activity,Decreased activity tolerance,Decreased endurance,Decreased range of motion,Decreased strength,Decreased balance,Decreased safety awareness,Difficulty walking,Impaired flexibility,Obesity  Visit  Diagnosis: Other abnormalities of gait and mobility  Muscle weakness (generalized)  Acute bilateral low back pain with right-sided sciatica  Chronic midline low back pain, unspecified whether sciatica present  Unsteadiness on feet  Other lack of coordination  Acute left-sided low back pain with left-sided sciatica  Repeated falls  Spinal cord injury, lumbar, without spinal bone injury, sequela (HCC)  Difficulty in walking, not elsewhere classified  Neurogenic orthostatic hypotension ([place])     Problem List Patient Active Problem List   Diagnosis Date Noted  . Nerve pain 10/18/2020  . Incomplete paraplegia ([place]) 10/18/2020  . Orthostatic hypotension 10/18/2020  . Chronic migraine without aura without status migrainosus, not intractable   . Hypokalemia   . Adjustment reaction with anxiety and depression   . Spinal cord injury, lumbar, without spinal bone injury, sequela ([place]) 08/18/2020  . Paraparesis ([place])   . Post-operative pain   . Hypotension   . Slow transit constipation   . AKI (acute kidney injury) ([place])   . Right leg weakness 08/11/2020  . DDD (degenerative disc disease), lumbar 09/02/2019    Class: Chronic  . Degenerative disc disease, lumbar 09/02/2019  . Chest tightness 09/23/2018  . Bradycardia 09/23/2018  . Labile blood pressure 03/08/2018  . Chronic low back pain 09/03/2017  . History of total hip replacement, left 01/26/2016  . EDEMA 04/28/2008  . LEG PAIN, BILATERAL 12/16/2007  . CERVICAL CANCER 07/02/2007  . MORBID OBESITY 07/02/2007  . DEPRESSION 07/02/2007  . COMMON MIGRAINE 07/02/2007  . Allergic rhinitis 07/02/2007  . ASTHMA 07/02/2007  . GERD 07/02/2007  . ELEVATED BLOOD PRESSURE WITHOUT DIAGNOSIS OF HYPERTENSION 07/02/2007   4:47 PM, 12/30/20 Etta Grandchild, PT, DPT Physical Therapist - [place] [facility] 12/30/2020, 1:42  PM  [place] MAIN [facility] SERVICES [address] [place], [place], [address] Phone: [phone]   Fax:  [phone]  Name: ZOILA DITULLIO MRN: [id number] Date of Birth: 06-Nov-1970

## 2021-01-03 ENCOUNTER — Ambulatory Visit: Payer: 59 | Attending: Physical Medicine and Rehabilitation

## 2021-01-03 ENCOUNTER — Other Ambulatory Visit: Payer: Self-pay

## 2021-01-03 VITALS — BP 95/54 | HR 87

## 2021-01-03 DIAGNOSIS — R2681 Unsteadiness on feet: Secondary | ICD-10-CM

## 2021-01-03 DIAGNOSIS — R2689 Other abnormalities of gait and mobility: Secondary | ICD-10-CM | POA: Diagnosis present

## 2021-01-03 DIAGNOSIS — M6281 Muscle weakness (generalized): Secondary | ICD-10-CM | POA: Diagnosis present

## 2021-01-03 DIAGNOSIS — R278 Other lack of coordination: Secondary | ICD-10-CM | POA: Diagnosis present

## 2021-01-03 NOTE — Therapy (Signed)
[place] MAIN [facility] SERVICES [address] [place], [place], [address] Phone: [phone]   Fax:  [phone]  Physical Therapy Treatment  Patient Details  Name: Vanessa Medina MRN: [id number] Date of Birth: 06/11/1971 Referring Provider (PT): Dr. Erling Cruz   Encounter Date: 01/03/2021   PT End of Session - 01/03/21 1621    Visit Number 31    Number of Visits 53    Date for PT Re-Evaluation 01/20/21    Authorization Type UHC Other    Authorization Time Period 10/28/20-01/20/21    PT Start Time 1432    PT Stop Time 1515    PT Time Calculation (min) 43 min    Equipment Utilized During Treatment Gait belt    Activity Tolerance Patient tolerated treatment well;No increased pain;Patient limited by fatigue    Behavior During Therapy [facility] for tasks assessed/performed           Past Medical History:  Diagnosis Date  . Anemia   . Cervical cancer ([place])   . Family history of adverse reaction to anesthesia     " my mother takes a long time time wake up"  . GERD (gastroesophageal reflux disease)   . Headache    migraine  . History of shingles   . Hypertension   . Migraine   . Multiple allergies   . Obesity   . Osteoarthritis    left hip  . PONV (postoperative nausea and vomiting)   . Sleep apnea    does not wear CPAP  . Wears glasses     Past Surgical History:  Procedure Laterality Date  . ABDOMINAL HYSTERECTOMY    . APPENDECTOMY    . BILIOPANCREATIC DIVERSION     with duodenal switch laparoscopic   . CHOLECYSTECTOMY    . DIAGNOSTIC LAPAROSCOPY     LOA  . ESOPHAGOGASTRODUODENOSCOPY (EGD) WITH PROPOFOL N/A 07/25/2017   Procedure: ESOPHAGOGASTRODUODENOSCOPY (EGD) WITH PROPOFOL;  Surgeon: Manya Silvas, MD;  Location: [facility] ENDOSCOPY;  Service: Endoscopy;  Laterality: N/A;  . KNEE ARTHROSCOPY    . LAPAROSCOPIC GASTRIC SLEEVE RESECTION WITH HIATAL HERNIA REPAIR    . TONSILLECTOMY    . TOTAL HIP ARTHROPLASTY Left 01/26/2016    Procedure: LEFT TOTAL HIP ARTHROPLASTY ANTERIOR APPROACH;  Surgeon: Leandrew Koyanagi, MD;  Location: [facility];  Service: Orthopedics;  Laterality: Left;  . TRANSFORAMINAL LUMBAR INTERBODY FUSION (TLIF) WITH PEDICLE SCREW FIXATION 3 LEVEL  08/2019   Basil Dess, MD L2-L5    Vitals:   01/03/21 1438 01/03/21 1458  BP: (!) 97/53 (!) 95/54  Pulse: 73 87  SpO2:  100%     Subjective Assessment - 01/03/21 1619    Subjective Pt reports issues with her BP again. She says she nearly passed out 5 times yesterday. She says she sat in her recliner for most of the day following episodes of light-headedness. ENT appointment tomorrow.    Pertinent History 50 year old female with history of HTN, migraines, morbid obesity--BMI-41, chronic hypotension, multiple back surgeries with chronic pain with LLE lumbar radiculopathy who was scheduled to have spinal cord stimulator placed by Dr. Davy Pique on 08/11/2020 but unable to perform surgery due to scar tissue.  History taken from patient and chart review.  Post op on awakening, she had excruciating pain RLE with plegia and and allodynia. She was admitted for work up by Dr. Ronnald Ramp from surgical center on 08/11/2020. Thoracic MRI done revealing subtle increased T2/STIR intensity distal cord at T12-L1 level suspicious for  edema/acute spinal cord injury, no CSF leak as well as prior PLIF L2-L5 with residual foraminal protrusion L3/5 potentially irritating right L3 nerve. She was started on IV Dilaudid, gabapentin as well as IV Decadron for management of pain, headaches and severe neuropathy.  She continues to have pain in RLE but is having some motor return, has constant headache, decreased hearing in right> left ear, constipation with nausea as well as epigastric pain and weakness. Therapy ongoing and CIR recommended due to functional decline.  Of note, she reports 50 lbs weigh loss in the past 3-4 months--due to issues with N/V/intake. Has had two falls last month---no recall of incident  leading to fall question due to syncope. Was in process of work up for renal disease. She has had issue with LLE weakness with pain for the past year and being followed by Dr. Ernestina Patches. Did well with stimulator trial.    How long can you sit comfortably? 1 hour (no recent change)    How long can you stand comfortably? 8 mins (no recent change)    How long can you walk comfortably? 20-30 minutes (no recent change)    Currently in Pain? Yes           TREATMENT:  THEREX:  Vitals monitored throughout session (see vitals)  Warm up on Nustep BUE/BLE level 1 x5 min, SPM 45-50s, close CGA for mount/dismount; Pt monitored for sx. Pt denies dizziness and light-headedness with exercise.   Instructed patient in seated exercise for UE/LE strengthening:   STS 2x5. VC for technique. Pt requires BUE assist to perform.  Assessed BP, HR, SPO2% after STS (see vitals)  Performed with 2.5# ankle weight: in // bars Standing marches - 3x15. VC/Demo for technique. Standing hip abduction - 3x15 VC/demo for technique Standing hip extension 1x15. VC/demo for technique Seated LAQ -  2x20 reps each LE. VC provided for technique. Pt reports no pain with exercise.  Seated adductor squeezes with red therapy ball -2x20.   Education provided via VC/demonstration to facilitate improved muscle contraction and movement at target joints with all exercise. Pt exhibits good carryover within session after cuing.     PT Education - 01/03/21 1620    Education Details exercise technique, body mechanics with standing hip extension and abduction    Person(s) Educated Patient    Methods Explanation;Demonstration;Verbal cues    Comprehension Verbalized understanding;Returned demonstration            PT Short Term Goals - 11/19/20 0807      PT SHORT TERM GOAL #1   Title Pt will be independent with HEP in order to improve strength and balance in order to decrease fall risk and improve function at home and work.     Baseline 10/14/2020 on-going/deferred; 10/28/2020 ongoing pt has resumed HEP, reports doing what she can/HEP to be advanced; 11/18/2020  Pt has had trouble doing HEP d/t fatigue.    Time 6    Period Weeks    Status On-going    Target Date 12/09/20             PT Long Term Goals - 12/30/20 1311      PT LONG TERM GOAL #1   Title Patient (< 81 years old) will complete five times sit to stand test in < 10 seconds indicating an increased LE strength and improved balance.    Baseline 12/29: 38.45s; 10/14/2020 42.8 seconds; 10/28/2020 36 seconds; 11/18/2020 34.84 seconds; 12/30/20 41.55sec    Time 12  Period Weeks    Status On-going    Target Date 01/20/21      PT LONG TERM GOAL #2   Title Pt will decrease TUG to below 14 seconds/decrease in order to demonstrate decreased fall risk.    Baseline 12/29: 32.94s; 10/14/2020 43.12 sec; 10/28/2020 34.12 seconds; 11/18/2020 26.5 sec; 12/30/20: 31sec c SPC    Time 12    Period Weeks    Status On-going    Target Date 01/20/21      PT LONG TERM GOAL #3   Title Pt will decrease DHI score by at least 18 points in order to demonstrate clinically significant reduction in disability    Baseline 12/29: 30; 10/14/2020 62, indicating severe handicap; 10/28/2020 74   Not reassessed, but reports no significant change/improvement with dizziness   Time 12    Period Weeks    Status On-going    Target Date 01/20/21      PT LONG TERM GOAL #4   Title Patient will increase 10 meter walk test to >1.31m/s as to improve gait speed for better community ambulation and to reduce fall risk.    Baseline 12/29: 0.37 m/s; 0.38 m/s; 2/10/08/2020 0.31 m/s 11/18/2020 0.34 m/s with SPC; 0.44m/s 12/30/20    Time 12    Period Weeks    Status On-going    Target Date 01/20/21      PT LONG TERM GOAL #5   Title Patient will increase FOTO score to equal to or greater than 64 to demonstrate statistically significant improvement in mobility and quality of life.    Baseline 12/29: 52;  10/14/2020 45; 10/28/2020 45; 11/18/2020 45; 12/30/20 44    Time 12    Period Weeks    Status On-going    Target Date 01/20/21                 Plan - 01/03/21 1621    Clinical Impression Statement Pt ability to particpate in PT was slightly limited at beginning of session secondary to low BP (see vitals). After pt warmed up on nustep and performed STS BP did not exhibit much of a change, but pt denied feelings of dizziness/light-headedness. Pt had no sx with all standing therex and tolerated them without any reports of pain as well. The pt will benefit from further skilled therapy to increase BLE strength, endurance, gait ability and balance to improve QOL.    Personal Factors and Comorbidities Comorbidity 3+;Time since onset of injury/illness/exacerbation    Comorbidities HYPOtension, paraparesis, SCI without spinal bone injury, DDD, cervical cancer, acute kidney injury, GERD, asthma, bradycardia, L total hip replacement, hypokalemia    Examination-Activity Limitations Squat;Lift;Stairs;Bend;Stand;Reach Overhead;Carry;Transfers    Examination-Participation Restrictions Cleaning;Laundry;Meal Prep;Community Activity;Driving;Occupation    Stability/Clinical Decision Making Evolving/Moderate complexity    Rehab Potential Fair    PT Frequency 3x / week    PT Duration 12 weeks    PT Treatment/Interventions ADLs/Self Care Home Management;Aquatic Therapy;Gait training;Stair training;Functional mobility training;Therapeutic activities;Therapeutic exercise;Balance training;Moist Heat;Manual techniques;Patient/family education;Neuromuscular re-education;Passive range of motion;Energy conservation;Vestibular;Joint Manipulations;Spinal Manipulations    PT Next Visit Plan LE stretching for pain modulation and improved hip mobility; trial treadmill walking, gait training; step-length/gait technique training; nustep for endurance; back mobility therex, progress treadmill endurance and gait training; STM to  cervical paraspinals and upper trap/levator scap, continue POC as indicated previously    PT Home Exercise Plan PROGRESS note: add new HEP, initiate UBE bike for cardiovasc endurance, R ankle motor control/strength, focus on STS and ambulation; added 11/18/2020 (handout)  hip adduction, thoracic lumbar extension and sit<>stand with counter support    Consulted and Agree with Plan of Care Patient           Patient will benefit from skilled therapeutic intervention in order to improve the following deficits and impairments:  Abnormal gait,Dizziness,Improper body mechanics,Decreased mobility,Cardiopulmonary status limiting activity,Decreased activity tolerance,Decreased endurance,Decreased range of motion,Decreased strength,Decreased balance,Decreased safety awareness,Difficulty walking,Impaired flexibility,Obesity  Visit Diagnosis: Other abnormalities of gait and mobility  Muscle weakness (generalized)  Unsteadiness on feet     Problem List Patient Active Problem List   Diagnosis Date Noted  . Nerve pain 10/18/2020  . Incomplete paraplegia ([place]) 10/18/2020  . Orthostatic hypotension 10/18/2020  . Chronic migraine without aura without status migrainosus, not intractable   . Hypokalemia   . Adjustment reaction with anxiety and depression   . Spinal cord injury, lumbar, without spinal bone injury, sequela ([place]) 08/18/2020  . Paraparesis ([place])   . Post-operative pain   . Hypotension   . Slow transit constipation   . AKI (acute kidney injury) ([place])   . Right leg weakness 08/11/2020  . DDD (degenerative disc disease), lumbar 09/02/2019    Class: Chronic  . Degenerative disc disease, lumbar 09/02/2019  . Chest tightness 09/23/2018  . Bradycardia 09/23/2018  . Labile blood pressure 03/08/2018  . Chronic low back pain 09/03/2017  . History of total hip replacement, left 01/26/2016  . EDEMA 04/28/2008  . LEG PAIN, BILATERAL 12/16/2007  . CERVICAL CANCER 07/02/2007  . MORBID OBESITY  07/02/2007  . DEPRESSION 07/02/2007  . COMMON MIGRAINE 07/02/2007  . Allergic rhinitis 07/02/2007  . ASTHMA 07/02/2007  . GERD 07/02/2007  . ELEVATED BLOOD PRESSURE WITHOUT DIAGNOSIS OF HYPERTENSION 07/02/2007   Ricard Dillon PT, DPT 01/03/2021, 4:30 PM  [place] MAIN [facility] SERVICES [address] [place], [place], [address] Phone: [phone]   Fax:  [phone]  Name: SAYLER MICKIEWICZ MRN: [id number] Date of Birth: 1971/07/17

## 2021-01-05 ENCOUNTER — Other Ambulatory Visit: Payer: Self-pay

## 2021-01-05 ENCOUNTER — Ambulatory Visit: Payer: 59

## 2021-01-05 DIAGNOSIS — R2689 Other abnormalities of gait and mobility: Secondary | ICD-10-CM | POA: Diagnosis not present

## 2021-01-05 DIAGNOSIS — M6281 Muscle weakness (generalized): Secondary | ICD-10-CM

## 2021-01-05 DIAGNOSIS — R2681 Unsteadiness on feet: Secondary | ICD-10-CM

## 2021-01-05 NOTE — Therapy (Signed)
[place] MAIN [facility] SERVICES [address] [place], [place], [address] Phone: [phone]   Fax:  [phone]  Physical Therapy Treatment  Patient Details  Name: Vanessa Medina MRN: [id number] Date of Birth: [birth year] Referring Provider (PT): Dr. Erling Cruz   Encounter Date: 01/05/2021   PT End of Session - 01/05/21 1207    Visit Number 32    Number of Visits 75    Date for PT Re-Evaluation 01/20/21    Authorization Type UHC Other    Authorization Time Period 10/28/20-01/20/21    PT Start Time 1016    PT Stop Time 1100    PT Time Calculation (min) 44 min    Equipment Utilized During Treatment Gait belt    Activity Tolerance Patient tolerated treatment well    Behavior During Therapy [facility] for tasks assessed/performed           Past Medical History:  Diagnosis Date  . Anemia   . Cervical cancer ([place])   . Family history of adverse reaction to anesthesia     " my mother takes a long time time wake up"  . GERD (gastroesophageal reflux disease)   . Headache    migraine  . History of shingles   . Hypertension   . Migraine   . Multiple allergies   . Obesity   . Osteoarthritis    left hip  . PONV (postoperative nausea and vomiting)   . Sleep apnea    does not wear CPAP  . Wears glasses     Past Surgical History:  Procedure Laterality Date  . ABDOMINAL HYSTERECTOMY    . APPENDECTOMY    . BILIOPANCREATIC DIVERSION     with duodenal switch laparoscopic   . CHOLECYSTECTOMY    . DIAGNOSTIC LAPAROSCOPY     LOA  . ESOPHAGOGASTRODUODENOSCOPY (EGD) WITH PROPOFOL N/A 07/25/2017   Procedure: ESOPHAGOGASTRODUODENOSCOPY (EGD) WITH PROPOFOL;  Surgeon: Manya Silvas, MD;  Location: [facility] ENDOSCOPY;  Service: Endoscopy;  Laterality: N/A;  . KNEE ARTHROSCOPY    . LAPAROSCOPIC GASTRIC SLEEVE RESECTION WITH HIATAL HERNIA REPAIR    . TONSILLECTOMY    . TOTAL HIP ARTHROPLASTY Left 01/26/2016   Procedure: LEFT TOTAL HIP ARTHROPLASTY ANTERIOR  APPROACH;  Surgeon: Leandrew Koyanagi, MD;  Location: [place];  Service: Orthopedics;  Laterality: Left;  . TRANSFORAMINAL LUMBAR INTERBODY FUSION (TLIF) WITH PEDICLE SCREW FIXATION 3 LEVEL  08/2019   Basil Dess, MD L2-L5    There were no vitals filed for this visit.   Subjective Assessment - 01/05/21 1205    Subjective Pt reports she is very congested today. She says she just got up. Pt says she is having continued fluttering in ears. Says no feelings of light-headedness this morning.    Pertinent History 50 year old female with history of HTN, migraines, morbid obesity--BMI-41, chronic hypotension, multiple back surgeries with chronic pain with LLE lumbar radiculopathy who was scheduled to have spinal cord stimulator placed by Dr. Davy Pique on 08/11/2020 but unable to perform surgery due to scar tissue.  History taken from patient and chart review.  Post op on awakening, she had excruciating pain RLE with plegia and and allodynia. She was admitted for work up by Dr. Ronnald Ramp from surgical center on 08/11/2020. Thoracic MRI done revealing subtle increased T2/STIR intensity distal cord at T12-L1 level suspicious for edema/acute spinal cord injury, no CSF leak as well as prior PLIF L2-L5 with residual foraminal protrusion L3/5 potentially irritating right L3 nerve. She was started  on IV Dilaudid, gabapentin as well as IV Decadron for management of pain, headaches and severe neuropathy.  She continues to have pain in RLE but is having some motor return, has constant headache, decreased hearing in right> left ear, constipation with nausea as well as epigastric pain and weakness. Therapy ongoing and CIR recommended due to functional decline.  Of note, she reports 50 lbs weigh loss in the past 3-4 months--due to issues with N/V/intake. Has had two falls last month---no recall of incident leading to fall question due to syncope. Was in process of work up for renal disease. She has had issue with LLE weakness with pain for the  past year and being followed by Dr. Ernestina Patches. Did well with stimulator trial.    How long can you sit comfortably? 1 hour (no recent change)    How long can you stand comfortably? 8 mins (no recent change)    How long can you walk comfortably? 20-30 minutes (no recent change)    Currently in Pain? Yes            TREATMENT  Therex:  Seated BP 92/54 mmHg, HR 80 bpm  In // bars: CGA for all of the following exercises  Forward/backward walking 10x each way, intermittent UE support. Shifts to R side. Pt reports slight increase LBP  Hip abduction/lateral walk - 12x forward and back (length of bars); Pt reports greater difficulty performing exercise going to R side than L side  Standing high-knee marches forward/backward - 10x for each; pt rates "hard"  Seated: Hot pack placed to low back (approx 10 min) with patient seated as she performed the following exercises  Physioball rollouts forward/backward, side-to-side 10x for each. Additionally, pt performs hold at end range for each rep for stretch. Limited ROM compared to previous sessions.  Seated core twists with yellow medicine ball - 2x10; pt reports no pain, rates exercise as easy  Hot pack removed at end of session. Skin checked and is WNL. No adverse reaction observed or reported with treatment. Pt says that her back feels "good" after use of hot pack.   Education provided with VC/TC and demonstration for movement at target joints and to facilitate correct muscle activation with all exercises.     PT Education - 01/05/21 1206    Education Details exercise technique, body mechanics with standing therex in // bars    Person(s) Educated Patient    Methods Explanation;Demonstration;Verbal cues    Comprehension Verbalized understanding;Returned demonstration            PT Short Term Goals - 11/19/20 0807      PT SHORT TERM GOAL #1   Title Pt will be independent with HEP in order to improve strength and balance in order to  decrease fall risk and improve function at home and work.    Baseline 10/14/2020 on-going/deferred; 10/28/2020 ongoing pt has resumed HEP, reports doing what she can/HEP to be advanced; 11/18/2020  Pt has had trouble doing HEP d/t fatigue.    Time 6    Period Weeks    Status On-going    Target Date 12/09/20             PT Long Term Goals - 12/30/20 1311      PT LONG TERM GOAL #1   Title Patient (< 82 years old) will complete five times sit to stand test in < 10 seconds indicating an increased LE strength and improved balance.    Baseline 12/29: 38.45s; 10/14/2020 42.8  seconds; 10/28/2020 36 seconds; 11/18/2020 34.84 seconds; 12/30/20 41.55sec    Time 12    Period Weeks    Status On-going    Target Date 01/20/21      PT LONG TERM GOAL #2   Title Pt will decrease TUG to below 14 seconds/decrease in order to demonstrate decreased fall risk.    Baseline 12/29: 32.94s; 10/14/2020 43.12 sec; 10/28/2020 34.12 seconds; 11/18/2020 26.5 sec; 12/30/20: 31sec c SPC    Time 12    Period Weeks    Status On-going    Target Date 01/20/21      PT LONG TERM GOAL #3   Title Pt will decrease DHI score by at least 18 points in order to demonstrate clinically significant reduction in disability    Baseline 12/29: 30; 10/14/2020 62, indicating severe handicap; 10/28/2020 74   Not reassessed, but reports no significant change/improvement with dizziness   Time 12    Period Weeks    Status On-going    Target Date 01/20/21      PT LONG TERM GOAL #4   Title Patient will increase 10 meter walk test to >1.29m/s as to improve gait speed for better community ambulation and to reduce fall risk.    Baseline 12/29: 0.37 m/s; 0.38 m/s; 2/10/08/2020 0.31 m/s 11/18/2020 0.34 m/s with SPC; 0.73m/s 12/30/20    Time 12    Period Weeks    Status On-going    Target Date 01/20/21      PT LONG TERM GOAL #5   Title Patient will increase FOTO score to equal to or greater than 64 to demonstrate statistically significant improvement in  mobility and quality of life.    Baseline 12/29: 52; 10/14/2020 45; 10/28/2020 45; 11/18/2020 45; 12/30/20 44    Time 12    Period Weeks    Status On-going    Target Date 01/20/21                 Plan - 01/05/21 1207    Clinical Impression Statement Pt BP remains low today (92/54 mmHG, pt seated) but pt reports no sx with her BP or with coming to a standing position. Pt was also able to tolerate standing/walking therex in // bars without symptoms. This indicates overall improvement in activity tolerance. Although pt shows improvement, BLEs were tremulous with side-stepping/hip abduction and high-knee marches. Pt reported fatigue as well. The pt will benefit from further skilled therapy to improve activity tolerance and BLE strength to improve functional mobility.    Personal Factors and Comorbidities Comorbidity 3+;Time since onset of injury/illness/exacerbation    Comorbidities HYPOtension, paraparesis, SCI without spinal bone injury, DDD, cervical cancer, acute kidney injury, GERD, asthma, bradycardia, L total hip replacement, hypokalemia    Examination-Activity Limitations Squat;Lift;Stairs;Bend;Stand;Reach Overhead;Carry;Transfers    Examination-Participation Restrictions Cleaning;Laundry;Meal Prep;Community Activity;Driving;Occupation    Stability/Clinical Decision Making Evolving/Moderate complexity    Rehab Potential Fair    PT Frequency 3x / week    PT Duration 12 weeks    PT Treatment/Interventions ADLs/Self Care Home Management;Aquatic Therapy;Gait training;Stair training;Functional mobility training;Therapeutic activities;Therapeutic exercise;Balance training;Moist Heat;Manual techniques;Patient/family education;Neuromuscular re-education;Passive range of motion;Energy conservation;Vestibular;Joint Manipulations;Spinal Manipulations    PT Next Visit Plan LE stretching for pain modulation and improved hip mobility; trial treadmill walking, gait training; step-length/gait technique  training; nustep for endurance; back mobility therex, progress treadmill endurance and gait training; STM to cervical paraspinals and upper trap/levator scap, continue POC as indicated previously    PT Home Exercise Plan PROGRESS note: add new HEP,  initiate UBE bike for cardiovasc endurance, R ankle motor control/strength, focus on STS and ambulation; added 11/18/2020 (handout) hip adduction, thoracic lumbar extension and sit<>stand with counter support    Consulted and Agree with Plan of Care Patient           Patient will benefit from skilled therapeutic intervention in order to improve the following deficits and impairments:  Abnormal gait,Dizziness,Improper body mechanics,Decreased mobility,Cardiopulmonary status limiting activity,Decreased activity tolerance,Decreased endurance,Decreased range of motion,Decreased strength,Decreased balance,Decreased safety awareness,Difficulty walking,Impaired flexibility,Obesity  Visit Diagnosis: Muscle weakness (generalized)  Other abnormalities of gait and mobility  Unsteadiness on feet     Problem List Patient Active Problem List   Diagnosis Date Noted  . Nerve pain 10/18/2020  . Incomplete paraplegia ([place]) 10/18/2020  . Orthostatic hypotension 10/18/2020  . Chronic migraine without aura without status migrainosus, not intractable   . Hypokalemia   . Adjustment reaction with anxiety and depression   . Spinal cord injury, lumbar, without spinal bone injury, sequela ([place]) 08/18/2020  . Paraparesis ([place])   . Post-operative pain   . Hypotension   . Slow transit constipation   . AKI (acute kidney injury) ([place])   . Right leg weakness 08/11/2020  . DDD (degenerative disc disease), lumbar 09/02/2019    Class: Chronic  . Degenerative disc disease, lumbar 09/02/2019  . Chest tightness 09/23/2018  . Bradycardia 09/23/2018  . Labile blood pressure 03/08/2018  . Chronic low back pain 09/03/2017  . History of total hip replacement, left  01/26/2016  . EDEMA 04/28/2008  . LEG PAIN, BILATERAL 12/16/2007  . CERVICAL CANCER 07/02/2007  . MORBID OBESITY 07/02/2007  . DEPRESSION 07/02/2007  . COMMON MIGRAINE 07/02/2007  . Allergic rhinitis 07/02/2007  . ASTHMA 07/02/2007  . GERD 07/02/2007  . ELEVATED BLOOD PRESSURE WITHOUT DIAGNOSIS OF HYPERTENSION 07/02/2007   Ricard Dillon PT, DPT 01/05/2021, 12:18 PM  [place] MAIN [facility] SERVICES [address] [place], [place], [address] Phone: [phone]   Fax:  [phone]  Name: Vanessa Medina MRN: [id number] Date of Birth: [birth year]

## 2021-01-10 ENCOUNTER — Other Ambulatory Visit: Payer: Self-pay

## 2021-01-10 ENCOUNTER — Ambulatory Visit: Payer: 59

## 2021-01-10 ENCOUNTER — Other Ambulatory Visit: Payer: Self-pay | Admitting: Specialist

## 2021-01-10 DIAGNOSIS — R2689 Other abnormalities of gait and mobility: Secondary | ICD-10-CM

## 2021-01-10 DIAGNOSIS — M6281 Muscle weakness (generalized): Secondary | ICD-10-CM

## 2021-01-10 DIAGNOSIS — R2681 Unsteadiness on feet: Secondary | ICD-10-CM

## 2021-01-10 NOTE — Therapy (Signed)
[place] MAIN [facility] SERVICES [address] [place], [place], [address] Phone: [phone]   Fax:  [phone]  Physical Therapy Treatment  Patient Details  Name: Vanessa Medina MRN: [id number] Date of Birth: [birth year] Referring Provider (PT): Dr. Erling Cruz   Encounter Date: 01/10/2021   PT End of Session - 01/10/21 1722    Visit Number 33    Number of Visits 16    Date for PT Re-Evaluation 01/20/21    Authorization Type UHC Other    Authorization Time Period 10/28/20-01/20/21    PT Start Time 1434    PT Stop Time 1514    PT Time Calculation (min) 40 min    Equipment Utilized During Treatment Gait belt    Activity Tolerance Patient tolerated treatment well    Behavior During Therapy [facility] for tasks assessed/performed           Past Medical History:  Diagnosis Date  . Anemia   . Cervical cancer ([place])   . Family history of adverse reaction to anesthesia     " my mother takes a long time time wake up"  . GERD (gastroesophageal reflux disease)   . Headache    migraine  . History of shingles   . Hypertension   . Migraine   . Multiple allergies   . Obesity   . Osteoarthritis    left hip  . PONV (postoperative nausea and vomiting)   . Sleep apnea    does not wear CPAP  . Wears glasses     Past Surgical History:  Procedure Laterality Date  . ABDOMINAL HYSTERECTOMY    . APPENDECTOMY    . BILIOPANCREATIC DIVERSION     with duodenal switch laparoscopic   . CHOLECYSTECTOMY    . DIAGNOSTIC LAPAROSCOPY     LOA  . ESOPHAGOGASTRODUODENOSCOPY (EGD) WITH PROPOFOL N/A 07/25/2017   Procedure: ESOPHAGOGASTRODUODENOSCOPY (EGD) WITH PROPOFOL;  Surgeon: Manya Silvas, MD;  Location: [facility] ENDOSCOPY;  Service: Endoscopy;  Laterality: N/A;  . KNEE ARTHROSCOPY    . LAPAROSCOPIC GASTRIC SLEEVE RESECTION WITH HIATAL HERNIA REPAIR    . TONSILLECTOMY    . TOTAL HIP ARTHROPLASTY Left 01/26/2016   Procedure: LEFT TOTAL HIP ARTHROPLASTY ANTERIOR  APPROACH;  Surgeon: Leandrew Koyanagi, MD;  Location: [place];  Service: Orthopedics;  Laterality: Left;  . TRANSFORAMINAL LUMBAR INTERBODY FUSION (TLIF) WITH PEDICLE SCREW FIXATION 3 LEVEL  08/2019   Basil Dess, MD L2-L5    There were no vitals filed for this visit. Subjective: Pt still dealing with inner-ear "fluttering" and dizziness symptoms. Pt had a good weekend with family. She says her son had her "walk a lot," and that she is tired today.  TREATMENT  Therex:  Ambulation around clinic for improved endurance/gait ability, with RW, CGA, around clinic gym (1 lap = approx 138 ft)  -  1x3, 1x1.  - Pt cued for increased step-length. R-hip drop present. Gait speed observed below WNL. She ambulates with crouched posture. No dizziness symptoms while ambulating. Seated rest break between sets.  Standing hip abduction at RW - 2x10 BLEs.  Neuro Re-Education:   Korebalance: CGA-min assist throughout Tux (penguin race) 4x from BUE support to UUE support. Emphasis on quick righting ability, use of ankle strategies, accuracy, weight-shifts. Pt accuracy decreases with decreasing UE support.  She requires up to min assist during two instances of LOB to regain balance. Maze -2x. Emphasis on limits of stability. No LOB. Pt does require some cuing for increasing weight-shifts  to anterior/to L side to meet target.  Education provided with VC/TC and demonstration for movement at target joints and to facilitate correct muscle activation with all exercises.    PT Education - 01/10/21 1721    Education Details exericse technique, body mechanics with ankle strategies/weight-shifts with Korebalance    Person(s) Educated Patient    Methods Explanation;Demonstration;Verbal cues    Comprehension Returned demonstration;Verbalized understanding            PT Short Term Goals - 11/19/20 0807      PT SHORT TERM GOAL #1   Title Pt will be independent with HEP in order to improve strength and balance in order to  decrease fall risk and improve function at home and work.    Baseline 10/14/2020 on-going/deferred; 10/28/2020 ongoing pt has resumed HEP, reports doing what she can/HEP to be advanced; 11/18/2020  Pt has had trouble doing HEP d/t fatigue.    Time 6    Period Weeks    Status On-going    Target Date 12/09/20             PT Long Term Goals - 12/30/20 1311      PT LONG TERM GOAL #1   Title Patient (< 57 years old) will complete five times sit to stand test in < 10 seconds indicating an increased LE strength and improved balance.    Baseline 12/29: 38.45s; 10/14/2020 42.8 seconds; 10/28/2020 36 seconds; 11/18/2020 34.84 seconds; 12/30/20 41.55sec    Time 12    Period Weeks    Status On-going    Target Date 01/20/21      PT LONG TERM GOAL #2   Title Pt will decrease TUG to below 14 seconds/decrease in order to demonstrate decreased fall risk.    Baseline 12/29: 32.94s; 10/14/2020 43.12 sec; 10/28/2020 34.12 seconds; 11/18/2020 26.5 sec; 12/30/20: 31sec c SPC    Time 12    Period Weeks    Status On-going    Target Date 01/20/21      PT LONG TERM GOAL #3   Title Pt will decrease DHI score by at least 18 points in order to demonstrate clinically significant reduction in disability    Baseline 12/29: 30; 10/14/2020 62, indicating severe handicap; 10/28/2020 74   Not reassessed, but reports no significant change/improvement with dizziness   Time 12    Period Weeks    Status On-going    Target Date 01/20/21      PT LONG TERM GOAL #4   Title Patient will increase 10 meter walk test to >1.33m/s as to improve gait speed for better community ambulation and to reduce fall risk.    Baseline 12/29: 0.37 m/s; 0.38 m/s; 2/10/08/2020 0.31 m/s 11/18/2020 0.34 m/s with SPC; 0.53m/s 12/30/20    Time 12    Period Weeks    Status On-going    Target Date 01/20/21      PT LONG TERM GOAL #5   Title Patient will increase FOTO score to equal to or greater than 64 to demonstrate statistically significant improvement in  mobility and quality of life.    Baseline 12/29: 52; 10/14/2020 45; 10/28/2020 45; 11/18/2020 45; 12/30/20 44    Time 12    Period Weeks    Status On-going    Target Date 01/20/21                 Plan - 01/10/21 1730    Clinical Impression Statement Pt continues to tolerate standing exercises (therex and balance)  without symptoms of dizziness. First part of session focused on endurance training/gait ability. Pt presents with multiple impairments of gait mechanics including R hip drop. Pt also challeneged with korebalance exercises testing pt's limits of stability and use of ankle strategies. The pt will benefit from further skilled therapy to improve endurance/gait ability, gait mechanics, LE strength and balance to improve QOL.    Personal Factors and Comorbidities Comorbidity 3+;Time since onset of injury/illness/exacerbation    Comorbidities HYPOtension, paraparesis, SCI without spinal bone injury, DDD, cervical cancer, acute kidney injury, GERD, asthma, bradycardia, L total hip replacement, hypokalemia    Examination-Activity Limitations Squat;Lift;Stairs;Bend;Stand;Reach Overhead;Carry;Transfers    Examination-Participation Restrictions Cleaning;Laundry;Meal Prep;Community Activity;Driving;Occupation    Stability/Clinical Decision Making Evolving/Moderate complexity    Rehab Potential Fair    PT Frequency 3x / week    PT Duration 12 weeks    PT Treatment/Interventions ADLs/Self Care Home Management;Aquatic Therapy;Gait training;Stair training;Functional mobility training;Therapeutic activities;Therapeutic exercise;Balance training;Moist Heat;Manual techniques;Patient/family education;Neuromuscular re-education;Passive range of motion;Energy conservation;Vestibular;Joint Manipulations;Spinal Manipulations    PT Next Visit Plan LE stretching for pain modulation and improved hip mobility; trial treadmill walking, gait training; step-length/gait technique training; nustep for endurance; back  mobility therex, progress treadmill endurance and gait training; STM to cervical paraspinals and upper trap/levator scap, continue POC as indicated previously    PT Home Exercise Plan PROGRESS note: add new HEP, initiate UBE bike for cardiovasc endurance, R ankle motor control/strength, focus on STS and ambulation; added 11/18/2020 (handout) hip adduction, thoracic lumbar extension and sit<>stand with counter support    Consulted and Agree with Plan of Care Patient           Patient will benefit from skilled therapeutic intervention in order to improve the following deficits and impairments:  Abnormal gait,Dizziness,Improper body mechanics,Decreased mobility,Cardiopulmonary status limiting activity,Decreased activity tolerance,Decreased endurance,Decreased range of motion,Decreased strength,Decreased balance,Decreased safety awareness,Difficulty walking,Impaired flexibility,Obesity  Visit Diagnosis: Muscle weakness (generalized)  Other abnormalities of gait and mobility  Unsteadiness on feet     Problem List Patient Active Problem List   Diagnosis Date Noted  . Nerve pain 10/18/2020  . Incomplete paraplegia ([place]) 10/18/2020  . Orthostatic hypotension 10/18/2020  . Chronic migraine without aura without status migrainosus, not intractable   . Hypokalemia   . Adjustment reaction with anxiety and depression   . Spinal cord injury, lumbar, without spinal bone injury, sequela ([place]) 08/18/2020  . Paraparesis ([place])   . Post-operative pain   . Hypotension   . Slow transit constipation   . AKI (acute kidney injury) ([place])   . Right leg weakness 08/11/2020  . DDD (degenerative disc disease), lumbar 09/02/2019    Class: Chronic  . Degenerative disc disease, lumbar 09/02/2019  . Chest tightness 09/23/2018  . Bradycardia 09/23/2018  . Labile blood pressure 03/08/2018  . Chronic low back pain 09/03/2017  . History of total hip replacement, left 01/26/2016  . EDEMA 04/28/2008  . LEG PAIN,  BILATERAL 12/16/2007  . CERVICAL CANCER 07/02/2007  . MORBID OBESITY 07/02/2007  . DEPRESSION 07/02/2007  . COMMON MIGRAINE 07/02/2007  . Allergic rhinitis 07/02/2007  . ASTHMA 07/02/2007  . GERD 07/02/2007  . ELEVATED BLOOD PRESSURE WITHOUT DIAGNOSIS OF HYPERTENSION 07/02/2007   Ricard Dillon PT, DPT 01/10/2021, 5:36 PM  [place] MAIN [facility] SERVICES [address] [place], [place], [address] Phone: [phone]   Fax:  [phone]  Name: Vanessa Medina MRN: [id number] Date of Birth: 29-Oct-1970

## 2021-01-12 ENCOUNTER — Other Ambulatory Visit: Payer: Self-pay

## 2021-01-12 ENCOUNTER — Ambulatory Visit: Payer: 59

## 2021-01-12 VITALS — BP 98/61 | HR 96

## 2021-01-12 DIAGNOSIS — M6281 Muscle weakness (generalized): Secondary | ICD-10-CM

## 2021-01-12 DIAGNOSIS — R2681 Unsteadiness on feet: Secondary | ICD-10-CM

## 2021-01-12 DIAGNOSIS — R2689 Other abnormalities of gait and mobility: Secondary | ICD-10-CM

## 2021-01-12 NOTE — Therapy (Signed)
[place] MAIN [facility] SERVICES [address] [place], [place], [address] Phone: [phone]   Fax:  [phone]  Physical Therapy Treatment  Patient Details  Name: Vanessa Medina MRN: [id number] Date of Birth: [birth year] Referring Provider (PT): Dr. Erling Cruz   Encounter Date: 01/12/2021   PT End of Session - 01/12/21 1205    Visit Number 34    Number of Visits 29    Date for PT Re-Evaluation 01/20/21    Authorization Type U[employer] Other    Authorization Time Period 10/28/20-01/20/21    PT Start Time 1022    PT Stop Time 1117    PT Time Calculation (min) 55 min    Equipment Utilized During Treatment Gait belt    Activity Tolerance Patient tolerated treatment well    Behavior During Therapy [facility] for tasks assessed/performed           Past Medical History:  Diagnosis Date  . Anemia   . Cervical cancer ([place])   . Family history of adverse reaction to anesthesia     " my mother takes a long time time wake up"  . GERD (gastroesophageal reflux disease)   . Headache    migraine  . History of shingles   . Hypertension   . Migraine   . Multiple allergies   . Obesity   . Osteoarthritis    left hip  . PONV (postoperative nausea and vomiting)   . Sleep apnea    does not wear CPAP  . Wears glasses     Past Surgical History:  Procedure Laterality Date  . ABDOMINAL HYSTERECTOMY    . APPENDECTOMY    . BILIOPANCREATIC DIVERSION     with duodenal switch laparoscopic   . CHOLECYSTECTOMY    . DIAGNOSTIC LAPAROSCOPY     LOA  . ESOPHAGOGASTRODUODENOSCOPY (EGD) WITH PROPOFOL N/A 07/25/2017   Procedure: ESOPHAGOGASTRODUODENOSCOPY (EGD) WITH PROPOFOL;  Surgeon: Manya Silvas, MD;  Location: [facility] ENDOSCOPY;  Service: Endoscopy;  Laterality: N/A;  . KNEE ARTHROSCOPY    . LAPAROSCOPIC GASTRIC SLEEVE RESECTION WITH HIATAL HERNIA REPAIR    . TONSILLECTOMY    . TOTAL HIP ARTHROPLASTY Left 01/26/2016   Procedure: LEFT TOTAL HIP ARTHROPLASTY ANTERIOR  APPROACH;  Surgeon: Leandrew Koyanagi, MD;  Location: [place];  Service: Orthopedics;  Laterality: Left;  . TRANSFORAMINAL LUMBAR INTERBODY FUSION (TLIF) WITH PEDICLE SCREW FIXATION 3 LEVEL  08/2019   Basil Dess, MD L2-L5    Vitals:   01/12/21 1209  BP: 98/61  Pulse: 96     Subjective Assessment - 01/12/21 1207    Subjective Pt reports no fluttering today in inner-ears and that she has not felt dizzy today. She does report spinning somtimes when she bends over and stands back up. She reports she was very sore after last session, but feels it was a productive soreness. She reports she has difficulty ascending steps due to toe-catch, and getting her legs in and out of a car.    Pertinent History 50 year old female with history of HTN, migraines, morbid obesity--BMI-41, chronic hypotension, multiple back surgeries with chronic pain with LLE lumbar radiculopathy who was scheduled to have spinal cord stimulator placed by Dr. Davy Pique on 08/11/2020 but unable to perform surgery due to scar tissue.  History taken from patient and chart review.  Post op on awakening, she had excruciating pain RLE with plegia and and allodynia. She was admitted for work up by Dr. Ronnald Ramp from surgical center on 08/11/2020. Thoracic  MRI done revealing subtle increased T2/STIR intensity distal cord at T12-L1 level suspicious for edema/acute spinal cord injury, no CSF leak as well as prior PLIF L2-L5 with residual foraminal protrusion L3/5 potentially irritating right L3 nerve. She was started on IV Dilaudid, gabapentin as well as IV Decadron for management of pain, headaches and severe neuropathy.  She continues to have pain in RLE but is having some motor return, has constant headache, decreased hearing in right> left ear, constipation with nausea as well as epigastric pain and weakness. Therapy ongoing and CIR recommended due to functional decline.  Of note, she reports 50 lbs weigh loss in the past 3-4 months--due to issues with  N/V/intake. Has had two falls last month---no recall of incident leading to fall question due to syncope. Was in process of work up for renal disease. She has had issue with LLE weakness with pain for the past year and being followed by Dr. Ernestina Patches. Did well with stimulator trial.    How long can you sit comfortably? 1 hour (no recent change)    How long can you stand comfortably? 8 mins (no recent change)    How long can you walk comfortably? 20-30 minutes (no recent change)    Currently in Pain? Yes    Pain Location Leg    Pain Orientation Right;Left           TREATMENT-  THEREX:  Vitals:  Seated LUE: 98/61 mmHg, HR 96 bpm  Ambulation for endurance/technique - 1x5 laps (1 lap = approx 138 ft) throughout clinic. Pt using SPC, PT provided CGA-min assist due to two instances of pt LOB in order to help pt regain balance. Increased supination of BLEs while in midstance. Pt reports no dizziness/lightheadedness with exercise. She does required rest break.  Step-length/gait speed exercise over 10 meter track, SPC, CGA: Trial 1: 29 steps  Trial 2: 26 steps Trial 3: 23 steps Trial 4: 23 steps Trial 5: 25 Trial 6: 24 Pt with minor decrease in postural stability with increased step-length.     Neuro Re-Education: Korebalance - CGA provided for the following Tux race - 4x, with decreasing levels of UE support (from BUE to UUE support). Pt accuracy of weight-shifts to meet targets decreases with less UE support and with increased speed.  SLD ball - 2x. Emphasis on lateral weight-shifts/ankle strategies. Pt shows improvement of technique by second round. Requires at least UUE support.    Pt seated with heat applied to low back musculature x 10 minutes (unbilled). Pt reports heat feels good on low back. No adverse reaction to treatment reported   Education provided throughout session primarily in the form of VC/Demo to facilitate improved technique with muscle activation and movement at target  joints. Pt exhibits good carryover within session after cuing.      PT Education - 01/12/21 1208    Education Details Exercise technique, body mechanics with gait/step-length/speed exercise    Person(s) Educated Patient    Methods Explanation;Verbal cues    Comprehension Verbalized understanding;Returned demonstration            PT Short Term Goals - 11/19/20 0807      PT SHORT TERM GOAL #1   Title Pt will be independent with HEP in order to improve strength and balance in order to decrease fall risk and improve function at home and work.    Baseline 10/14/2020 on-going/deferred; 10/28/2020 ongoing pt has resumed HEP, reports doing what she can/HEP to be advanced; 11/18/2020  Pt has  had trouble doing HEP d/t fatigue.    Time 6    Period Weeks    Status On-going    Target Date 12/09/20             PT Long Term Goals - 12/30/20 1311      PT LONG TERM GOAL #1   Title Patient (< 77 years old) will complete five times sit to stand test in < 10 seconds indicating an increased LE strength and improved balance.    Baseline 12/29: 38.45s; 10/14/2020 42.8 seconds; 10/28/2020 36 seconds; 11/18/2020 34.84 seconds; 12/30/20 41.55sec    Time 12    Period Weeks    Status On-going    Target Date 01/20/21      PT LONG TERM GOAL #2   Title Pt will decrease TUG to below 14 seconds/decrease in order to demonstrate decreased fall risk.    Baseline 12/29: 32.94s; 10/14/2020 43.12 sec; 10/28/2020 34.12 seconds; 11/18/2020 26.5 sec; 12/30/20: 31sec c SPC    Time 12    Period Weeks    Status On-going    Target Date 01/20/21      PT LONG TERM GOAL #3   Title Pt will decrease DHI score by at least 18 points in order to demonstrate clinically significant reduction in disability    Baseline 12/29: 30; 10/14/2020 62, indicating severe handicap; 10/28/2020 74   Not reassessed, but reports no significant change/improvement with dizziness   Time 12    Period Weeks    Status On-going    Target Date 01/20/21       PT LONG TERM GOAL #4   Title Patient will increase 10 meter walk test to >1.17m/s as to improve gait speed for better community ambulation and to reduce fall risk.    Baseline 12/29: 0.37 m/s; 0.38 m/s; 2/10/08/2020 0.31 m/s 11/18/2020 0.34 m/s with SPC; 0.28m/s 12/30/20    Time 12    Period Weeks    Status On-going    Target Date 01/20/21      PT LONG TERM GOAL #5   Title Patient will increase FOTO score to equal to or greater than 64 to demonstrate statistically significant improvement in mobility and quality of life.    Baseline 12/29: 52; 10/14/2020 45; 10/28/2020 45; 11/18/2020 45; 12/30/20 44    Time 12    Period Weeks    Status On-going    Target Date 01/20/21                 Plan - 01/12/21 1215    Clinical Impression Statement Pt continues to tolerate standing therex well without reports of dizziness, however, she still fatigues quickly.  She also exhibits decreased postural stability with ambulation as she fatigues, requiring up to min assist from PT to maintain balance. Pt shows good accuracy with weight-shifts/ankle strategies on Korebalance with slower speeds, but has difficulty with anticipatory postural control at higher speeds. The pt will benefit form further skilled therapy to improve BLE strength, endurance, and balance to improve safety and QOL.    Personal Factors and Comorbidities Comorbidity 3+;Time since onset of injury/illness/exacerbation    Comorbidities HYPOtension, paraparesis, SCI without spinal bone injury, DDD, cervical cancer, acute kidney injury, GERD, asthma, bradycardia, L total hip replacement, hypokalemia    Examination-Activity Limitations Squat;Lift;Stairs;Bend;Stand;Reach Overhead;Carry;Transfers    Examination-Participation Restrictions Cleaning;Laundry;Meal Prep;Community Activity;Driving;Occupation    Stability/Clinical Decision Making Evolving/Moderate complexity    Rehab Potential Fair    PT Frequency 3x / week    PT Duration  12 weeks     PT Treatment/Interventions ADLs/Self Care Home Management;Aquatic Therapy;Gait training;Stair training;Functional mobility training;Therapeutic activities;Therapeutic exercise;Balance training;Moist Heat;Manual techniques;Patient/family education;Neuromuscular re-education;Passive range of motion;Energy conservation;Vestibular;Joint Manipulations;Spinal Manipulations    PT Next Visit Plan LE stretching for pain modulation and improved hip mobility; trial treadmill walking, gait training; step-length/gait technique training; nustep for endurance; back mobility therex, progress treadmill endurance and gait training; STM to cervical paraspinals and upper trap/levator scap, stair training, car transfers    PT Home Exercise Plan PROGRESS note: add new HEP, initiate UBE bike for cardiovasc endurance, R ankle motor control/strength, focus on STS and ambulation; added 11/18/2020 (handout) hip adduction, thoracic lumbar extension and sit<>stand with counter support    Consulted and Agree with Plan of Care Patient           Patient will benefit from skilled therapeutic intervention in order to improve the following deficits and impairments:  Abnormal gait,Dizziness,Improper body mechanics,Decreased mobility,Cardiopulmonary status limiting activity,Decreased activity tolerance,Decreased endurance,Decreased range of motion,Decreased strength,Decreased balance,Decreased safety awareness,Difficulty walking,Impaired flexibility,Obesity  Visit Diagnosis: Muscle weakness (generalized)  Other abnormalities of gait and mobility  Unsteadiness on feet     Problem List Patient Active Problem List   Diagnosis Date Noted  . Nerve pain 10/18/2020  . Incomplete paraplegia ([place]) 10/18/2020  . Orthostatic hypotension 10/18/2020  . Chronic migraine without aura without status migrainosus, not intractable   . Hypokalemia   . Adjustment reaction with anxiety and depression   . Spinal cord injury, lumbar, without  spinal bone injury, sequela ([place]) 08/18/2020  . Paraparesis ([place])   . Post-operative pain   . Hypotension   . Slow transit constipation   . AKI (acute kidney injury) ([place])   . Right leg weakness 08/11/2020  . DDD (degenerative disc disease), lumbar 09/02/2019    Class: Chronic  . Degenerative disc disease, lumbar 09/02/2019  . Chest tightness 09/23/2018  . Bradycardia 09/23/2018  . Labile blood pressure 03/08/2018  . Chronic low back pain 09/03/2017  . History of total hip replacement, left 01/26/2016  . EDEMA 04/28/2008  . LEG PAIN, BILATERAL 12/16/2007  . CERVICAL CANCER 07/02/2007  . MORBID OBESITY 07/02/2007  . DEPRESSION 07/02/2007  . COMMON MIGRAINE 07/02/2007  . Allergic rhinitis 07/02/2007  . ASTHMA 07/02/2007  . GERD 07/02/2007  . ELEVATED BLOOD PRESSURE WITHOUT DIAGNOSIS OF HYPERTENSION 07/02/2007   Ricard Dillon PT, DPT 01/12/2021, 12:20 PM  [place] MAIN [facility] SERVICES [address] [place], [place], [address] Phone: [phone]   Fax:  [phone]  Name: Vanessa Medina MRN: [id number] Date of Birth: 07/20/1971

## 2021-01-14 ENCOUNTER — Encounter: Payer: Self-pay | Admitting: Physical Medicine and Rehabilitation

## 2021-01-14 ENCOUNTER — Encounter: Payer: 59 | Attending: Physical Medicine and Rehabilitation | Admitting: Physical Medicine and Rehabilitation

## 2021-01-14 ENCOUNTER — Other Ambulatory Visit: Payer: Self-pay

## 2021-01-14 VITALS — BP 111/71 | HR 86 | Temp 98.3°F | Ht 65.0 in | Wt 247.0 lb

## 2021-01-14 DIAGNOSIS — G8222 Paraplegia, incomplete: Secondary | ICD-10-CM | POA: Diagnosis present

## 2021-01-14 DIAGNOSIS — G8929 Other chronic pain: Secondary | ICD-10-CM | POA: Insufficient documentation

## 2021-01-14 DIAGNOSIS — I951 Orthostatic hypotension: Secondary | ICD-10-CM | POA: Insufficient documentation

## 2021-01-14 DIAGNOSIS — R3915 Urgency of urination: Secondary | ICD-10-CM | POA: Diagnosis present

## 2021-01-14 DIAGNOSIS — M545 Low back pain, unspecified: Secondary | ICD-10-CM

## 2021-01-14 NOTE — Patient Instructions (Signed)
Pt is a 50 yr old female with paraparesis/incomplete paraplegia due to attempted spinal cord stimulator placement-  With chronic hx of orthostasis, BMI of 41 s/p gastric sleeve, Acute on chronic renal issues, here for f/u on SCI and orthostatic hypotension. Vestibular issues due to trigger points.     Plan:      1. Patient here for trigger point injections for vestibular issues as well as trigger points/tight muscles-   Consent done and on chart.  Cleaned areas with alcohol and injected using a 27 gauge 1.5 inch needle  Injected 3cc Using 1% Lidocaine with no EPI  Upper traps B/L Levators B/L Posterior scalenes B/L Middle scalenes B/L- post auricular Splenius Capitus Pectoralis Major Rhomboids Infraspinatus Teres Major/minor Thoracic paraspinals Lumbar paraspinals Other injections-    Patient's level of pain prior was 9/10 Current level of pain after injections is 6/10  There was no bleeding or complications.  Patient was advised to drink a lot of water on day after injections to flush system Will have increased soreness for 12-48 hours after injections.  Can use Lidocaine patches the day AFTER injections Can use theracane on day of injections in places didn't inject Can use heating pad 4-6 hours AFTER injections  2. Will place urology referral- and also check U/A and Cx just to make sure doesn't have low grade UTI.   3. Is seeing Vestibular PT- no improvement so far. Might be better now that have gotten trigger point injections. Needs to use theracane to [facility] relaxation.   4. Continue working and increasing if possible- let me know when/if needs more information for work.   5.  Get theracane- or something like it- (don't like back buddy).  Hold pressure 2-4 minutes- don't massage- youtube has some great videos to show you how to use.   6. F/U in 6 weeks-

## 2021-01-14 NOTE — Progress Notes (Signed)
Subjective:     Patient ID: Vanessa Medina, female   DOB: 03-13-1971, 50 y.o.   MRN: [id number]  HPI   Pt is a 50 yr old female with paraparesis/incomplete paraplegia due to attempted spinal cord stimulator placement-  With chronic hx of orthostasis, BMI of 41 s/p gastric sleeve, Acute on chronic renal issues, here for f/u on SCI and orthostatic hypotension.  Went to ENT- was referred to vestibular PT-  Not helping much.  Getting to the point is crying- theres' so much pressure, has "all the time".   Was told doesn't have an otolith "out".  Gets dizzy with head turning all the time - feels like "drowning" in something-  Taking all the allergy meds.    More low BP issues lately- is 111/71 in office today.  Has been drinking well- has been 90s/50s at home.   Working 16 hours/week- M/T 4 hrs and H/F 4 hours- off Wednesday.  Seems to help a lot.   Appears to be having issues with bladder- cannot hold it to get to bathroom- having urgency and if doesn't get there, will have accident- Not frequency, but when needs t go, NEEDS to go-    Been taking Flonase and Singulair- and also put on azelastine nasal spray-   Pain Inventory Average Pain 5 Pain Right Now 7 My pain is sharp, burning, dull, stabbing, tingling and aching  LOCATION OF PAIN  Hand, fingers, back, buttocks,hip, knee, leg, yoes  BOWEL Number of stools per week: 7-10 Oral laxative use No  Type of laxative  Enema or suppository use No  History of colostomy No  Incontinent No   BLADDER Normal In and out cath, frequency  Able to self cath n/a Bladder incontinence No  Frequent urination No  Leakage with coughing Yes  Difficulty starting stream No  Incomplete bladder emptying No    Mobility walk without assistance walk with assistance use a cane use a walker how many minutes can you walk? 5 ability to climb steps?  yes do you drive?  yes  Function employed # of hrs/week 16 I need assistance with the  following:  household duties  Neuro/Psych bladder control problems weakness numbness tremor trouble walking spasms dizziness  Prior Studies Any changes since last visit?  no  Physicians involved in your care Any changes since last visit?  no   Family History  Problem Relation Age of Onset  . Renal Disease Mother   . Hypertension Mother   . Sudden Cardiac Death Mother   . Heart failure Mother   . Valvular heart disease Mother   . Heart disease Mother   . Stroke Brother   . Heart attack Brother 26  . Diabetes Other   . Breast cancer Cousin        paternal side   Social History   Socioeconomic History  . Marital status: Divorced    Spouse name: Not on file  . Number of children: 1  . Years of education: Not on file  . Highest education level: Not on file  Occupational History  . Not on file  Tobacco Use  . Smoking status: Never Smoker  . Smokeless tobacco: Never Used  Vaping Use  . Vaping Use: Never used  Substance and Sexual Activity  . Alcohol use: Not Currently    Comment: occasional wine  . Drug use: No  . Sexual activity: Not Currently  Other Topics Concern  . Not on file  Social History Narrative  .  Not on file   Social Determinants of Health   Financial Resource Strain: Not on file  Food Insecurity: Not on file  Transportation Needs: Not on file  Physical Activity: Not on file  Stress: Not on file  Social Connections: Not on file   Past Surgical History:  Procedure Laterality Date  . ABDOMINAL HYSTERECTOMY    . APPENDECTOMY    . BILIOPANCREATIC DIVERSION     with duodenal switch laparoscopic   . CHOLECYSTECTOMY    . DIAGNOSTIC LAPAROSCOPY     LOA  . ESOPHAGOGASTRODUODENOSCOPY (EGD) WITH PROPOFOL N/A 07/25/2017   Procedure: ESOPHAGOGASTRODUODENOSCOPY (EGD) WITH PROPOFOL;  Surgeon: Manya Silvas, MD;  Location: [facility] ENDOSCOPY;  Service: Endoscopy;  Laterality: N/A;  . KNEE ARTHROSCOPY    . LAPAROSCOPIC GASTRIC SLEEVE RESECTION WITH  HIATAL HERNIA REPAIR    . TONSILLECTOMY    . TOTAL HIP ARTHROPLASTY Left 01/26/2016   Procedure: LEFT TOTAL HIP ARTHROPLASTY ANTERIOR APPROACH;  Surgeon: Leandrew Koyanagi, MD;  Location: [place];  Service: Orthopedics;  Laterality: Left;  . TRANSFORAMINAL LUMBAR INTERBODY FUSION (TLIF) WITH PEDICLE SCREW FIXATION 3 LEVEL  08/2019   Basil Dess, MD L2-L5   Past Medical History:  Diagnosis Date  . Anemia   . Cervical cancer ([place])   . Family history of adverse reaction to anesthesia     " my mother takes a long time time wake up"  . GERD (gastroesophageal reflux disease)   . Headache    migraine  . History of shingles   . Hypertension   . Migraine   . Multiple allergies   . Obesity   . Osteoarthritis    left hip  . PONV (postoperative nausea and vomiting)   . Sleep apnea    does not wear CPAP  . Wears glasses    BP 111/71   Pulse 86   Temp 98.3 F (36.8 C)   Ht 5\' 5"  (1.651 m)   Wt 247 lb (112 kg)   SpO2 98%   BMI 41.10 kg/m   Opioid Risk Score:   Fall Risk Score:  `1  Depression screen PHQ 2/9  Depression screen [facility] 2/9 10/18/2020 09/15/2020  Decreased Interest 0 0  Down, Depressed, Hopeless 0 0  PHQ - 2 Score 0 0  Altered sleeping - 0  Tired, decreased energy - 0  Change in appetite - 0  Feeling bad or failure about yourself  - 0  Trouble concentrating - 0  Moving slowly or fidgety/restless - 1  Suicidal thoughts - 0  PHQ-9 Score - 1  Difficult doing work/chores - Somewhat difficult  Some recent data might be hidden    Review of Systems  Constitutional: Negative.   HENT: Negative.   Eyes: Negative.   Respiratory: Negative.   Cardiovascular: Negative.   Gastrointestinal: Positive for nausea.  Endocrine: Negative.   Genitourinary: Negative.   Musculoskeletal: Positive for arthralgias, back pain, myalgias, neck pain and neck stiffness.       Spasms   Skin: Negative.   Allergic/Immunologic: Negative.   Neurological: Positive for dizziness, tremors, weakness  and numbness.  Hematological: Negative.   Psychiatric/Behavioral: Positive for dysphoric mood.  All other systems reviewed and are negative.      Objective:   Physical Exam Awake, alert, c/o nausea and dizziness with head movement, has single point cane; NAD RUE and R leg has fine tremor Also shoulders near ears B/L Tight trigger points in scalenes B/L and upper traps and levators- L>R Sounds very  congested as well     Assessment:     Pt is a 50 yr old female with paraparesis/incomplete paraplegia due to attempted spinal cord stimulator placement-  With chronic hx of orthostasis, BMI of 41 s/p gastric sleeve, Acute on chronic renal issues, here for f/u on SCI and orthostatic hypotension. Vestibular issues due to trigger points.     Plan:      1. Patient here for trigger point injections for vestibular issues as well as trigger points/tight muscles-   Consent done and on chart.  Cleaned areas with alcohol and injected using a 27 gauge 1.5 inch needle  Injected 3cc Using 1% Lidocaine with no EPI  Upper traps B/L Levators B/L Posterior scalenes B/L Middle scalenes B/L- post auricular Splenius Capitus Pectoralis Major Rhomboids Infraspinatus Teres Major/minor Thoracic paraspinals Lumbar paraspinals Other injections-    Patient's level of pain prior was 9/10 Current level of pain after injections is 6/10  There was no bleeding or complications.  Patient was advised to drink a lot of water on day after injections to flush system Will have increased soreness for 12-48 hours after injections.  Can use Lidocaine patches the day AFTER injections Can use theracane on day of injections in places didn't inject Can use heating pad 4-6 hours AFTER injections  2. Will place urology referral- and also check U/A and Cx just to make sure doesn't have low grade UTI.   3. Is seeing Vestibular PT- no improvement so far. Might be better now that have gotten trigger point  injections. Needs to use theracane to [facility] relaxation.   4. Continue working and increasing if possible- let me know when/if needs more information for work.   5.  Get theracane- or something like it- (don't like back buddy).  Hold pressure 2-4 minutes- don't massage- youtube has some great videos to show you how to use.   6. F/U in 6 weeks-      I spent a total of 25 minutes on visit- and 10 minutes on injections. Going over vestibular issues and neurogenic bladder issues as discussed above.

## 2021-01-17 ENCOUNTER — Ambulatory Visit: Payer: 59

## 2021-01-17 ENCOUNTER — Other Ambulatory Visit: Payer: Self-pay | Admitting: Physical Medicine and Rehabilitation

## 2021-01-17 ENCOUNTER — Other Ambulatory Visit: Payer: Self-pay

## 2021-01-17 DIAGNOSIS — R2689 Other abnormalities of gait and mobility: Secondary | ICD-10-CM

## 2021-01-17 DIAGNOSIS — M6281 Muscle weakness (generalized): Secondary | ICD-10-CM

## 2021-01-17 DIAGNOSIS — R2681 Unsteadiness on feet: Secondary | ICD-10-CM

## 2021-01-17 NOTE — Therapy (Signed)
[place] MAIN [facility] SERVICES [address] [place], [place], [address] Phone: [phone]   Fax:  [phone]  Physical Therapy Treatment  Patient Details  Name: Vanessa Medina MRN: [id number] Date of Birth: 08/28/1971 Referring Provider (PT): Dr. Erling Cruz   Encounter Date: 01/17/2021   PT End of Session - 01/17/21 1531    Visit Number 35    Number of Visits 85    Date for PT Re-Evaluation 01/20/21    Authorization Type [employer] Other    Authorization Time Period 10/28/20-01/20/21    PT Start Time 1434    PT Stop Time 1517    PT Time Calculation (min) 43 min    Equipment Utilized During Treatment Gait belt    Activity Tolerance Patient tolerated treatment well    Behavior During Therapy [facility] for tasks assessed/performed           Past Medical History:  Diagnosis Date  . Anemia   . Cervical cancer ([place])   . Family history of adverse reaction to anesthesia     " my mother takes a long time time wake up"  . GERD (gastroesophageal reflux disease)   . Headache    migraine  . History of shingles   . Hypertension   . Migraine   . Multiple allergies   . Obesity   . Osteoarthritis    left hip  . PONV (postoperative nausea and vomiting)   . Sleep apnea    does not wear CPAP  . Wears glasses     Past Surgical History:  Procedure Laterality Date  . ABDOMINAL HYSTERECTOMY    . APPENDECTOMY    . BILIOPANCREATIC DIVERSION     with duodenal switch laparoscopic   . CHOLECYSTECTOMY    . DIAGNOSTIC LAPAROSCOPY     LOA  . ESOPHAGOGASTRODUODENOSCOPY (EGD) WITH PROPOFOL N/A 07/25/2017   Procedure: ESOPHAGOGASTRODUODENOSCOPY (EGD) WITH PROPOFOL;  Surgeon: Manya Silvas, MD;  Location: [facility] ENDOSCOPY;  Service: Endoscopy;  Laterality: N/A;  . KNEE ARTHROSCOPY    . LAPAROSCOPIC GASTRIC SLEEVE RESECTION WITH HIATAL HERNIA REPAIR    . TONSILLECTOMY    . TOTAL HIP ARTHROPLASTY Left 01/26/2016   Procedure: LEFT TOTAL HIP ARTHROPLASTY ANTERIOR  APPROACH;  Surgeon: Leandrew Koyanagi, MD;  Location: [place];  Service: Orthopedics;  Laterality: Left;  . TRANSFORAMINAL LUMBAR INTERBODY FUSION (TLIF) WITH PEDICLE SCREW FIXATION 3 LEVEL  08/2019   Basil Dess, MD L2-L5    There were no vitals filed for this visit.   Subjective Assessment - 01/17/21 1543    Subjective Pt is congested today. She is waiting for allergy testing appointment with her ENT. She states, "I had 8 injections in my shoulders and neck on Friday." She reports soreness in area. She saw her doctor last Friday for injections following N/V with episode of ear-fluttering. She says she has continued to have ear fluttering sensation but does not feel nauseous. She says sometimes, but not often, she can hear her heartbeat or voice in her ears. She says she did not go to church on Sunday because "being around a lot of people makes me dizzy."    Pertinent History 50 year old female with history of HTN, migraines, morbid obesity--BMI-41, chronic hypotension, multiple back surgeries with chronic pain with LLE lumbar radiculopathy who was scheduled to have spinal cord stimulator placed by Dr. Davy Pique on 08/11/2020 but unable to perform surgery due to scar tissue.  History taken from patient and chart review.  Post  op on awakening, she had excruciating pain RLE with plegia and and allodynia. She was admitted for work up by Dr. Ronnald Ramp from surgical center on 08/11/2020. Thoracic MRI done revealing subtle increased T2/STIR intensity distal cord at T12-L1 level suspicious for edema/acute spinal cord injury, no CSF leak as well as prior PLIF L2-L5 with residual foraminal protrusion L3/5 potentially irritating right L3 nerve. She was started on IV Dilaudid, gabapentin as well as IV Decadron for management of pain, headaches and severe neuropathy.  She continues to have pain in RLE but is having some motor return, has constant headache, decreased hearing in right> left ear, constipation with nausea as well as  epigastric pain and weakness. Therapy ongoing and CIR recommended due to functional decline.  Of note, she reports 50 lbs weigh loss in the past 3-4 months--due to issues with N/V/intake. Has had two falls last month---no recall of incident leading to fall question due to syncope. Was in process of work up for renal disease. She has had issue with LLE weakness with pain for the past year and being followed by Dr. Ernestina Patches. Did well with stimulator trial.    How long can you sit comfortably? 1 hour (no recent change)    How long can you stand comfortably? 8 mins (no recent change)    How long can you walk comfortably? 20-30 minutes (no recent change)    Currently in Pain? Yes    Pain Location Knee    Pain Onset More than a month ago   pt with chronic back pain from over a month ago but with acute pain as well due to recent fall at home         TREATMENT   Therex -   Ambulation for endurance/technique - 1x2 laps (1 lap = approx 138 ft) throughout clinic. Pt using SPC, PT provided CGA.  R-side hip drop present.   Step-ups onto 6" step - 1x10 alternating LE leading step.  Stairs - pt exhibits step-to pattern ascending/descending first two reps with BUE support. Difficulty with push-off of RLE. Pt cued to attempt recip. Pattern while ascending. Pt able to demo reciprocal pattern ascending, but with increased difficulty, use of UUE support 3x.  Close CGA provided throughout. Pt rates exercise challenging.   At support surface: Standing hip abduction with 2.5# AW - 2x10 BLEs. Pt rates greater difficulty performing exercise with RLE compared to LLE. Standing marches with 2.5# AW - 2x10 BLEs. Pt rates exercise "medium" difficulty."  Gait trainer on treadmill - x 5.5 min, close CGA throughout. HR monitored (range during exercise 90s-105 bpm) Results: Time spent on each foot: 47% on RLE, 53% on LLE. Step length: R LE 0.31 meters, LLE 0.39 meters. Pt ambulates at 1.1 mph for majority of exercise. She  rates exercise "medium" difficulty. She uses BUE support.    Education provided with VC/TC and demonstration for movement at target joints and to facilitate correct muscle activation with all exercises. Pt exhibits good carryover of technique within session.   PT Education - 01/17/21 1526    Education Details exercise technique, body mechanics with hip abduction    Person(s) Educated Patient    Methods Explanation;Demonstration;Verbal cues    Comprehension Verbalized understanding;Returned demonstration            PT Short Term Goals - 11/19/20 0807      PT SHORT TERM GOAL #1   Title Pt will be independent with HEP in order to improve strength and balance in order  to decrease fall risk and improve function at home and work.    Baseline 10/14/2020 on-going/deferred; 10/28/2020 ongoing pt has resumed HEP, reports doing what she can/HEP to be advanced; 11/18/2020  Pt has had trouble doing HEP d/t fatigue.    Time 6    Period Weeks    Status On-going    Target Date 12/09/20             PT Long Term Goals - 12/30/20 1311      PT LONG TERM GOAL #1   Title Patient (< 67 years old) will complete five times sit to stand test in < 10 seconds indicating an increased LE strength and improved balance.    Baseline 12/29: 38.45s; 10/14/2020 42.8 seconds; 10/28/2020 36 seconds; 11/18/2020 34.84 seconds; 12/30/20 41.55sec    Time 12    Period Weeks    Status On-going    Target Date 01/20/21      PT LONG TERM GOAL #2   Title Pt will decrease TUG to below 14 seconds/decrease in order to demonstrate decreased fall risk.    Baseline 12/29: 32.94s; 10/14/2020 43.12 sec; 10/28/2020 34.12 seconds; 11/18/2020 26.5 sec; 12/30/20: 31sec c SPC    Time 12    Period Weeks    Status On-going    Target Date 01/20/21      PT LONG TERM GOAL #3   Title Pt will decrease DHI score by at least 18 points in order to demonstrate clinically significant reduction in disability    Baseline 12/29: 30; 10/14/2020 62,  indicating severe handicap; 10/28/2020 74   Not reassessed, but reports no significant change/improvement with dizziness   Time 12    Period Weeks    Status On-going    Target Date 01/20/21      PT LONG TERM GOAL #4   Title Patient will increase 10 meter walk test to >1.48m/s as to improve gait speed for better community ambulation and to reduce fall risk.    Baseline 12/29: 0.37 m/s; 0.38 m/s; 2/10/08/2020 0.31 m/s 11/18/2020 0.34 m/s with SPC; 0.34m/s 12/30/20    Time 12    Period Weeks    Status On-going    Target Date 01/20/21      PT LONG TERM GOAL #5   Title Patient will increase FOTO score to equal to or greater than 64 to demonstrate statistically significant improvement in mobility and quality of life.    Baseline 12/29: 52; 10/14/2020 45; 10/28/2020 45; 11/18/2020 45; 12/30/20 44    Time 12    Period Weeks    Status On-going    Target Date 01/20/21                 Plan - 01/17/21 1544    Clinical Impression Statement Pt did not have any dizziness with exercises performed today. Pt has difficulty ascending/descending stairs, where she takes increased time to ascend steps with recip. pattern. She otherwise uses step-to pattern to descend steps. Pt will require further stair training in future sessions to improve safety. Gait trainer analysis indicates pt taking smaller steps and has decreased stance time on RLE compared to LLE. The pt will benefit from further skilled therapy to improve BLE strength, gait and balance to decrease fall risk and improve QOL.    Personal Factors and Comorbidities Comorbidity 3+;Time since onset of injury/illness/exacerbation    Comorbidities HYPOtension, paraparesis, SCI without spinal bone injury, DDD, cervical cancer, acute kidney injury, GERD, asthma, bradycardia, L total hip replacement, hypokalemia  Examination-Activity Limitations Squat;Lift;Stairs;Bend;Stand;Reach Overhead;Carry;Transfers    Examination-Participation Restrictions  Cleaning;Laundry;Meal Prep;Community Activity;Driving;Occupation    Stability/Clinical Decision Making Evolving/Moderate complexity    Rehab Potential Fair    PT Frequency 3x / week    PT Duration 12 weeks    PT Treatment/Interventions ADLs/Self Care Home Management;Aquatic Therapy;Gait training;Stair training;Functional mobility training;Therapeutic activities;Therapeutic exercise;Balance training;Moist Heat;Manual techniques;Patient/family education;Neuromuscular re-education;Passive range of motion;Energy conservation;Vestibular;Joint Manipulations;Spinal Manipulations    PT Next Visit Plan LE stretching for pain modulation and improved hip mobility; trial treadmill walking, gait training; step-length/gait technique training; nustep for endurance; back mobility therex, progress treadmill endurance and gait training; STM to cervical paraspinals and upper trap/levator scap, stair training, car transfers, to follow POC as previously indicated    PT Home Exercise Plan PROGRESS note: add new HEP, initiate UBE bike for cardiovasc endurance, R ankle motor control/strength, focus on STS and ambulation; added 11/18/2020 (handout) hip adduction, thoracic lumbar extension and sit<>stand with counter support    Consulted and Agree with Plan of Care Patient           Patient will benefit from skilled therapeutic intervention in order to improve the following deficits and impairments:  Abnormal gait,Dizziness,Improper body mechanics,Decreased mobility,Cardiopulmonary status limiting activity,Decreased activity tolerance,Decreased endurance,Decreased range of motion,Decreased strength,Decreased balance,Decreased safety awareness,Difficulty walking,Impaired flexibility,Obesity  Visit Diagnosis: Other abnormalities of gait and mobility  Muscle weakness (generalized)  Unsteadiness on feet     Problem List Patient Active Problem List   Diagnosis Date Noted  . Nerve pain 10/18/2020  . Incomplete  paraplegia ([place]) 10/18/2020  . Orthostatic hypotension 10/18/2020  . Chronic migraine without aura without status migrainosus, not intractable   . Hypokalemia   . Adjustment reaction with anxiety and depression   . Spinal cord injury, lumbar, without spinal bone injury, sequela ([place]) 08/18/2020  . Paraparesis ([place])   . Post-operative pain   . Hypotension   . Slow transit constipation   . AKI (acute kidney injury) ([place])   . Right leg weakness 08/11/2020  . DDD (degenerative disc disease), lumbar 09/02/2019    Class: Chronic  . Degenerative disc disease, lumbar 09/02/2019  . Chest tightness 09/23/2018  . Bradycardia 09/23/2018  . Labile blood pressure 03/08/2018  . Chronic low back pain 09/03/2017  . History of total hip replacement, left 01/26/2016  . EDEMA 04/28/2008  . LEG PAIN, BILATERAL 12/16/2007  . CERVICAL CANCER 07/02/2007  . MORBID OBESITY 07/02/2007  . DEPRESSION 07/02/2007  . COMMON MIGRAINE 07/02/2007  . Allergic rhinitis 07/02/2007  . ASTHMA 07/02/2007  . GERD 07/02/2007  . ELEVATED BLOOD PRESSURE WITHOUT DIAGNOSIS OF HYPERTENSION 07/02/2007   Ricard Dillon PT, DPT 01/17/2021, 3:49 PM  [place] MAIN [facility] SERVICES [address] [place], [place], [address] Phone: [phone]   Fax:  [phone]  Name: Vanessa Medina MRN: [id number] Date of Birth: 22-Dec-1970

## 2021-01-19 ENCOUNTER — Encounter: Payer: Self-pay | Admitting: Specialist

## 2021-01-19 ENCOUNTER — Ambulatory Visit (INDEPENDENT_AMBULATORY_CARE_PROVIDER_SITE_OTHER): Payer: 59 | Admitting: Specialist

## 2021-01-19 ENCOUNTER — Other Ambulatory Visit: Payer: Self-pay

## 2021-01-19 ENCOUNTER — Ambulatory Visit: Payer: 59

## 2021-01-19 VITALS — BP 107/71 | HR 80 | Ht 65.0 in | Wt 247.0 lb

## 2021-01-19 DIAGNOSIS — R2689 Other abnormalities of gait and mobility: Secondary | ICD-10-CM | POA: Diagnosis not present

## 2021-01-19 DIAGNOSIS — G8220 Paraplegia, unspecified: Secondary | ICD-10-CM

## 2021-01-19 DIAGNOSIS — R2681 Unsteadiness on feet: Secondary | ICD-10-CM

## 2021-01-19 DIAGNOSIS — G8929 Other chronic pain: Secondary | ICD-10-CM

## 2021-01-19 DIAGNOSIS — M170 Bilateral primary osteoarthritis of knee: Secondary | ICD-10-CM | POA: Diagnosis not present

## 2021-01-19 DIAGNOSIS — M47814 Spondylosis without myelopathy or radiculopathy, thoracic region: Secondary | ICD-10-CM

## 2021-01-19 DIAGNOSIS — M6281 Muscle weakness (generalized): Secondary | ICD-10-CM

## 2021-01-19 DIAGNOSIS — M533 Sacrococcygeal disorders, not elsewhere classified: Secondary | ICD-10-CM

## 2021-01-19 DIAGNOSIS — R278 Other lack of coordination: Secondary | ICD-10-CM

## 2021-01-19 DIAGNOSIS — M1711 Unilateral primary osteoarthritis, right knee: Secondary | ICD-10-CM | POA: Diagnosis not present

## 2021-01-19 NOTE — Therapy (Signed)
[place] MAIN [facility] SERVICES [address] [place], [place], [address] Phone: [phone]   Fax:  [phone]  Physical Therapy Treatment  Patient Details  Name: Vanessa Medina MRN: [id number] Date of Birth: 03/03/1971 Referring Provider (PT): Dr. Erling Cruz   Encounter Date: 01/19/2021   PT End of Session - 01/19/21 1942    Visit Number 36    Number of Visits 53    Date for PT Re-Evaluation 01/20/21    Authorization Type UHC Other    Authorization Time Period 10/28/20-01/20/21    PT Start Time 8099    PT Stop Time 1429    PT Time Calculation (min) 40 min    Equipment Utilized During Treatment Gait belt    Activity Tolerance Patient tolerated treatment well    Behavior During Therapy [facility] for tasks assessed/performed           Past Medical History:  Diagnosis Date  . Anemia   . Cervical cancer ([place])   . Family history of adverse reaction to anesthesia     " my mother takes a long time time wake up"  . GERD (gastroesophageal reflux disease)   . Headache    migraine  . History of shingles   . Hypertension   . Migraine   . Multiple allergies   . Obesity   . Osteoarthritis    left hip  . PONV (postoperative nausea and vomiting)   . Sleep apnea    does not wear CPAP  . Wears glasses     Past Surgical History:  Procedure Laterality Date  . ABDOMINAL HYSTERECTOMY    . APPENDECTOMY    . BILIOPANCREATIC DIVERSION     with duodenal switch laparoscopic   . CHOLECYSTECTOMY    . DIAGNOSTIC LAPAROSCOPY     LOA  . ESOPHAGOGASTRODUODENOSCOPY (EGD) WITH PROPOFOL N/A 07/25/2017   Procedure: ESOPHAGOGASTRODUODENOSCOPY (EGD) WITH PROPOFOL;  Surgeon: Manya Silvas, MD;  Location: [facility] ENDOSCOPY;  Service: Endoscopy;  Laterality: N/A;  . KNEE ARTHROSCOPY    . LAPAROSCOPIC GASTRIC SLEEVE RESECTION WITH HIATAL HERNIA REPAIR    . TONSILLECTOMY    . TOTAL HIP ARTHROPLASTY Left 01/26/2016   Procedure: LEFT TOTAL HIP ARTHROPLASTY ANTERIOR  APPROACH;  Surgeon: Leandrew Koyanagi, MD;  Location: [place];  Service: Orthopedics;  Laterality: Left;  . TRANSFORAMINAL LUMBAR INTERBODY FUSION (TLIF) WITH PEDICLE SCREW FIXATION 3 LEVEL  08/2019   Basil Dess, MD L2-L5    There were no vitals filed for this visit.   Subjective Assessment - 01/19/21 1350    Subjective Pt reports 5/10 knee pain today. She says fluttering was "so bad yesterday" that she had to stay in her recliner. Pt reports she was nauseated but feels ok at the moment. She says she feels tired today.    Pertinent History 50 year old female with history of HTN, migraines, morbid obesity--BMI-41, chronic hypotension, multiple back surgeries with chronic pain with LLE lumbar radiculopathy who was scheduled to have spinal cord stimulator placed by Dr. Davy Pique on 08/11/2020 but unable to perform surgery due to scar tissue.  History taken from patient and chart review.  Post op on awakening, she had excruciating pain RLE with plegia and and allodynia. She was admitted for work up by Dr. Ronnald Ramp from surgical center on 08/11/2020. Thoracic MRI done revealing subtle increased T2/STIR intensity distal cord at T12-L1 level suspicious for edema/acute spinal cord injury, no CSF leak as well as prior PLIF L2-L5 with residual foraminal protrusion  L3/5 potentially irritating right L3 nerve. She was started on IV Dilaudid, gabapentin as well as IV Decadron for management of pain, headaches and severe neuropathy.  She continues to have pain in RLE but is having some motor return, has constant headache, decreased hearing in right> left ear, constipation with nausea as well as epigastric pain and weakness. Therapy ongoing and CIR recommended due to functional decline.  Of note, she reports 50 lbs weigh loss in the past 3-4 months--due to issues with N/V/intake. Has had two falls last month---no recall of incident leading to fall question due to syncope. Was in process of work up for renal disease. She has had issue  with LLE weakness with pain for the past year and being followed by Dr. Ernestina Patches. Did well with stimulator trial.    How long can you sit comfortably? 1 hour (no recent change)    How long can you stand comfortably? 8 mins (no recent change)    How long can you walk comfortably? 20-30 minutes (no recent change)    Currently in Pain? Yes    Pain Score 5     Pain Location Knee    Pain Onset More than a month ago   pt with chronic back pain from over a month ago but with acute pain as well due to recent fall at home          TREATMENT   Neuro Re-ed: CGA provided throughout  [place]- Tux racer - 4x to emphasize anticipatory postural control. Pt begins with BUE support and decreases to no UE support, but requires some tapping on handrails due to LOB to maintain balance. Koremaze 4x - difficulty with posterior weight-shifts, scaling smaller weight-shifts/limits of stability  Therex: CGA provided throughout  Standing hip abduction with 2.5# AWs 1x10 BLEs, progressed to 3# AW on LLE 2x15 BLEs   Resisted walking forward/backward at Matrix - 2.5# 10x progressed to 7.5# 10x; pt rates exercise difficult  Standing marches 2x15  Education provided with VC/TC and demonstration for movement at target joints and to facilitate correct muscle activation with all exercises. Pt exhibits good carryover of technique within session.   Assessment: Pt challenged with accuracy of smaller weight-shifts in posterior direction and anticipatory postural control during Korebalance exercises. Pt's ability to hit target/accuracy also decreases with increased speed. Pt does exhibit a few instaces of LOB where she uses UE support to regain balance. Pt continues to tolerate standing therex well without increased pain. She was able to progress to resisted walking in Matrix today. The pt will benefit from further skilled therapy to improve BLE strength and balance in order to improve gait mechanics and decrease fall  risk.       PT Education - 01/19/21 1941    Education Details exercise technique, body mechanics with resisted walking at [facility]) Educated Patient    Methods Explanation;Verbal cues;Demonstration    Comprehension Verbalized understanding;Returned demonstration            PT Short Term Goals - 11/19/20 0807      PT SHORT TERM GOAL #1   Title Pt will be independent with HEP in order to improve strength and balance in order to decrease fall risk and improve function at home and work.    Baseline 10/14/2020 on-going/deferred; 10/28/2020 ongoing pt has resumed HEP, reports doing what she can/HEP to be advanced; 11/18/2020  Pt has had trouble doing HEP d/t fatigue.    Time 6    Period  Weeks    Status On-going    Target Date 12/09/20             PT Long Term Goals - 12/30/20 1311      PT LONG TERM GOAL #1   Title Patient (< 91 years old) will complete five times sit to stand test in < 10 seconds indicating an increased LE strength and improved balance.    Baseline 12/29: 38.45s; 10/14/2020 42.8 seconds; 10/28/2020 36 seconds; 11/18/2020 34.84 seconds; 12/30/20 41.55sec    Time 12    Period Weeks    Status On-going    Target Date 01/20/21      PT LONG TERM GOAL #2   Title Pt will decrease TUG to below 14 seconds/decrease in order to demonstrate decreased fall risk.    Baseline 12/29: 32.94s; 10/14/2020 43.12 sec; 10/28/2020 34.12 seconds; 11/18/2020 26.5 sec; 12/30/20: 31sec c SPC    Time 12    Period Weeks    Status On-going    Target Date 01/20/21      PT LONG TERM GOAL #3   Title Pt will decrease DHI score by at least 18 points in order to demonstrate clinically significant reduction in disability    Baseline 12/29: 30; 10/14/2020 62, indicating severe handicap; 10/28/2020 74   Not reassessed, but reports no significant change/improvement with dizziness   Time 12    Period Weeks    Status On-going    Target Date 01/20/21      PT LONG TERM GOAL #4   Title Patient will  increase 10 meter walk test to >1.64m/s as to improve gait speed for better community ambulation and to reduce fall risk.    Baseline 12/29: 0.37 m/s; 0.38 m/s; 2/10/08/2020 0.31 m/s 11/18/2020 0.34 m/s with SPC; 0.27m/s 12/30/20    Time 12    Period Weeks    Status On-going    Target Date 01/20/21      PT LONG TERM GOAL #5   Title Patient will increase FOTO score to equal to or greater than 64 to demonstrate statistically significant improvement in mobility and quality of life.    Baseline 12/29: 52; 10/14/2020 45; 10/28/2020 45; 11/18/2020 45; 12/30/20 44    Time 12    Period Weeks    Status On-going    Target Date 01/20/21                 Plan - 01/19/21 1951    Clinical Impression Statement Pt challenged with accuracy of smaller weight-shifts in posterior direction and anticipatory postural control during Korebalance exercises. Pt's ability to hit target/accuracy also decreases with increased speed. Pt does exhibit a few instaces of LOB where she uses UE support to regain balance. Pt continues to tolerate standing therex well without increased pain. She was able to progress to resisted walking in Matrix today. The pt will benefit from further skilled therapy to improve BLE strength and balance in order to improve gait mechanics and decrease fall risk.    Personal Factors and Comorbidities Comorbidity 3+;Time since onset of injury/illness/exacerbation    Comorbidities HYPOtension, paraparesis, SCI without spinal bone injury, DDD, cervical cancer, acute kidney injury, GERD, asthma, bradycardia, L total hip replacement, hypokalemia    Examination-Activity Limitations Squat;Lift;Stairs;Bend;Stand;Reach Overhead;Carry;Transfers    Examination-Participation Restrictions Cleaning;Laundry;Meal Prep;Community Activity;Driving;Occupation    Stability/Clinical Decision Making Evolving/Moderate complexity    Rehab Potential Fair    PT Frequency 3x / week    PT Duration 12 weeks    PT  Treatment/Interventions ADLs/Self Care Home Management;Aquatic Therapy;Gait training;Stair training;Functional mobility training;Therapeutic activities;Therapeutic exercise;Balance training;Moist Heat;Manual techniques;Patient/family education;Neuromuscular re-education;Passive range of motion;Energy conservation;Vestibular;Joint Manipulations;Spinal Manipulations    PT Next Visit Plan LE stretching for pain modulation and improved hip mobility; trial treadmill walking, gait training; step-length/gait technique training; nustep for endurance; back mobility therex, progress treadmill endurance and gait training; STM to cervical paraspinals and upper trap/levator scap, stair training, car transfers, reassess goals    PT Home Exercise Plan PROGRESS note: add new HEP, initiate UBE bike for cardiovasc endurance, R ankle motor control/strength, focus on STS and ambulation; added 11/18/2020 (handout) hip adduction, thoracic lumbar extension and sit<>stand with counter support    Consulted and Agree with Plan of Care Patient           Patient will benefit from skilled therapeutic intervention in order to improve the following deficits and impairments:  Abnormal gait,Dizziness,Improper body mechanics,Decreased mobility,Cardiopulmonary status limiting activity,Decreased activity tolerance,Decreased endurance,Decreased range of motion,Decreased strength,Decreased balance,Decreased safety awareness,Difficulty walking,Impaired flexibility,Obesity  Visit Diagnosis: Muscle weakness (generalized)  Unsteadiness on feet  Other lack of coordination  Other abnormalities of gait and mobility     Problem List Patient Active Problem List   Diagnosis Date Noted  . Nerve pain 10/18/2020  . Incomplete paraplegia ([place]) 10/18/2020  . Orthostatic hypotension 10/18/2020  . Chronic migraine without aura without status migrainosus, not intractable   . Hypokalemia   . Adjustment reaction with anxiety and depression    . Spinal cord injury, lumbar, without spinal bone injury, sequela ([place]) 08/18/2020  . Paraparesis ([place])   . Post-operative pain   . Hypotension   . Slow transit constipation   . AKI (acute kidney injury) ([place])   . Right leg weakness 08/11/2020  . DDD (degenerative disc disease), lumbar 09/02/2019    Class: Chronic  . Degenerative disc disease, lumbar 09/02/2019  . Chest tightness 09/23/2018  . Bradycardia 09/23/2018  . Labile blood pressure 03/08/2018  . Chronic low back pain 09/03/2017  . History of total hip replacement, left 01/26/2016  . EDEMA 04/28/2008  . LEG PAIN, BILATERAL 12/16/2007  . CERVICAL CANCER 07/02/2007  . MORBID OBESITY 07/02/2007  . DEPRESSION 07/02/2007  . COMMON MIGRAINE 07/02/2007  . Allergic rhinitis 07/02/2007  . ASTHMA 07/02/2007  . GERD 07/02/2007  . ELEVATED BLOOD PRESSURE WITHOUT DIAGNOSIS OF HYPERTENSION 07/02/2007   Ricard Dillon PT, DPT 01/19/2021, 7:55 PM  [place] MAIN [facility] SERVICES [address] [place], [place], [address] Phone: [phone]   Fax:  [phone]  Name: Vanessa Medina MRN: [id number] Date of Birth: [birth year]

## 2021-01-19 NOTE — Patient Instructions (Addendum)
The main ways of treat osteoarthritis, that are found to be success. Weight loss helps to decrease pain. Exercise is important to maintaining cartilage and thickness and strengthening. Transdermal voltaren gel, capsacain creame are sometime beneficial. Ice is okay  In afternoon and evening and hot shower in the am Will schedule you an appointment to see Dr. Erlinda Hong for consideration of intervention to treat chronic  Degenerative joint disease right knee.

## 2021-01-19 NOTE — Progress Notes (Signed)
Office Visit Note   Patient: Vanessa Medina           Date of Birth: [birth year]           MRN: [id number] Visit Date: 01/19/2021              Requested by: Maryland Pink, MD [address] [place],  [place] [address] PCP: Maryland Pink, MD   Assessment & Plan: Visit Diagnoses:  1. Osteoarthritis of patellofemoral joints of both knees   2. Osteoarthritis of right patellofemoral joint   3. Spondylosis of thoracic spine without myelopathy   4. Spinal paraparesis ([place])   5. Chronic left SI joint pain     Plan: The main ways of treat osteoarthritis, that are found to be success. Weight loss helps to decrease pain. Exercise is important to maintaining cartilage and thickness and strengthening. Transdermal voltaren gel, capsacain creame are sometime beneficial. Ice is okay  In afternoon and evening and hot shower in the am Will schedule you an appointment to see Dr. Erlinda Hong for consideration of intervention to treat chronic  Degenerative joint disease right knee.   Follow-Up Instructions: Return in about 3 months (around 04/21/2021) for schedule her an appointment to be seen by Dr. Erlinda Hong, eval and consideration of trtmt for OA right knee..   Orders:  No orders of the defined types were placed in this encounter.  No orders of the defined types were placed in this encounter.     Procedures: No procedures performed   Clinical Data: No additional findings.   Subjective: Chief Complaint  Patient presents with  . Lower Back - Follow-up    50 year old female with history of right ACL reconstruction 25 years ago in [place], [place] by a Psychologist, sport and exercise Dr. Leslee Home.  She injured the right knee playing tennis and did well with the ACL reconstruction since she had a complete ACL tear. She did not return to playing tennis. There is leg numbness and burning sensation into the feet and there is pain with standiing on the feet. Now with some bladder issues and saw Dr. Dagoberto Ligas last  week and she is now referred to a urologist. Has vertigo and fluttering in her ears and has had recent injections by Dr Dagoberto Ligas,    Review of Systems  Constitutional: Negative.   HENT: Positive for congestion, rhinorrhea, sinus pressure, sinus pain and sneezing.   Eyes: Positive for pain, redness and itching.  Cardiovascular: Negative.   Gastrointestinal: Negative.   Endocrine: Negative.   Genitourinary: Negative.   Musculoskeletal: Positive for neck pain and neck stiffness.  Skin: Negative.   Allergic/Immunologic: Negative.   Neurological: Positive for dizziness and syncope.  Hematological: Negative.   Psychiatric/Behavioral: Negative.      Objective: Vital Signs: BP 107/71 (BP Location: Left Arm, Patient Position: Sitting)   Pulse 80   Ht 5\' 5"  (1.651 m)   Wt 247 lb (112 kg)   BMI 41.10 kg/m   Physical Exam Constitutional:      Appearance: She is well-developed.  HENT:     Head: Normocephalic and atraumatic.  Eyes:     Pupils: Pupils are equal, round, and reactive to light.  Pulmonary:     Effort: Pulmonary effort is normal.     Breath sounds: Normal breath sounds.  Abdominal:     General: Bowel sounds are normal.     Palpations: Abdomen is soft.  Musculoskeletal:     Cervical back: Normal range of motion and  neck supple.     Lumbar back: Positive right straight leg raise test and positive left straight leg raise test.  Skin:    General: Skin is warm and dry.  Neurological:     Mental Status: She is alert and oriented to person, place, and time.  Psychiatric:        Behavior: Behavior normal.        Thought Content: Thought content normal.        Judgment: Judgment normal.     Back Exam   Tenderness  The patient is experiencing tenderness in the lumbar.  Range of Motion  Extension: normal  Flexion: normal  Lateral bend right: abnormal  Lateral bend left: abnormal  Rotation right: abnormal  Rotation left: abnormal   Muscle Strength  Right  Quadriceps:  5/5  Left Quadriceps:  5/5  Right Hamstrings:  5/5  Left Hamstrings:  5/5   Tests  Straight leg raise right: positive Straight leg raise left: positive  Reflexes  Patellar: 0/4 Achilles: 0/4 Babinski's sign: normal       Specialty Comments:  No specialty comments available.  Imaging: No results found.   PMFS History: Patient Active Problem List   Diagnosis Date Noted  . DDD (degenerative disc disease), lumbar 09/02/2019    Priority: High    Class: Chronic  . Nerve pain 10/18/2020  . Incomplete paraplegia ([place]) 10/18/2020  . Orthostatic hypotension 10/18/2020  . Chronic migraine without aura without status migrainosus, not intractable   . Hypokalemia   . Adjustment reaction with anxiety and depression   . Spinal cord injury, lumbar, without spinal bone injury, sequela ([place]) 08/18/2020  . Paraparesis ([place])   . Post-operative pain   . Hypotension   . Slow transit constipation   . AKI (acute kidney injury) ([place])   . Right leg weakness 08/11/2020  . Degenerative disc disease, lumbar 09/02/2019  . Chest tightness 09/23/2018  . Bradycardia 09/23/2018  . Labile blood pressure 03/08/2018  . Chronic low back pain 09/03/2017  . History of total hip replacement, left 01/26/2016  . EDEMA 04/28/2008  . LEG PAIN, BILATERAL 12/16/2007  . CERVICAL CANCER 07/02/2007  . MORBID OBESITY 07/02/2007  . DEPRESSION 07/02/2007  . COMMON MIGRAINE 07/02/2007  . Allergic rhinitis 07/02/2007  . ASTHMA 07/02/2007  . GERD 07/02/2007  . ELEVATED BLOOD PRESSURE WITHOUT DIAGNOSIS OF HYPERTENSION 07/02/2007   Past Medical History:  Diagnosis Date  . Anemia   . Cervical cancer ([place])   . Family history of adverse reaction to anesthesia     " my mother takes a long time time wake up"  . GERD (gastroesophageal reflux disease)   . Headache    migraine  . History of shingles   . Hypertension   . Migraine   . Multiple allergies   . Obesity   . Osteoarthritis    left hip   . PONV (postoperative nausea and vomiting)   . Sleep apnea    does not wear CPAP  . Wears glasses     Family History  Problem Relation Age of Onset  . Renal Disease Mother   . Hypertension Mother   . Sudden Cardiac Death Mother   . Heart failure Mother   . Valvular heart disease Mother   . Heart disease Mother   . Stroke Brother   . Heart attack Brother 24  . Diabetes Other   . Breast cancer Cousin        paternal side    Past Surgical  History:  Procedure Laterality Date  . ABDOMINAL HYSTERECTOMY    . APPENDECTOMY    . BILIOPANCREATIC DIVERSION     with duodenal switch laparoscopic   . CHOLECYSTECTOMY    . DIAGNOSTIC LAPAROSCOPY     LOA  . ESOPHAGOGASTRODUODENOSCOPY (EGD) WITH PROPOFOL N/A 07/25/2017   Procedure: ESOPHAGOGASTRODUODENOSCOPY (EGD) WITH PROPOFOL;  Surgeon: Manya Silvas, MD;  Location: [facility] ENDOSCOPY;  Service: Endoscopy;  Laterality: N/A;  . KNEE ARTHROSCOPY    . LAPAROSCOPIC GASTRIC SLEEVE RESECTION WITH HIATAL HERNIA REPAIR    . TONSILLECTOMY    . TOTAL HIP ARTHROPLASTY Left 01/26/2016   Procedure: LEFT TOTAL HIP ARTHROPLASTY ANTERIOR APPROACH;  Surgeon: Leandrew Koyanagi, MD;  Location: [place];  Service: Orthopedics;  Laterality: Left;  . TRANSFORAMINAL LUMBAR INTERBODY FUSION (TLIF) WITH PEDICLE SCREW FIXATION 3 LEVEL  08/2019   Basil Dess, MD L2-L5   Social History   Occupational History  . Not on file  Tobacco Use  . Smoking status: Never Smoker  . Smokeless tobacco: Never Used  Vaping Use  . Vaping Use: Never used  Substance and Sexual Activity  . Alcohol use: Not Currently    Comment: occasional wine  . Drug use: No  . Sexual activity: Not Currently

## 2021-01-21 LAB — URINALYSIS
Bilirubin, UA: NEGATIVE
Glucose, UA: NEGATIVE
Ketones, UA: NEGATIVE
Nitrite, UA: POSITIVE — AB
Protein,UA: NEGATIVE
RBC, UA: NEGATIVE
Specific Gravity, UA: 1.011 (ref 1.005–1.030)
Urobilinogen, Ur: 0.2 mg/dL (ref 0.2–1.0)
pH, UA: 7 (ref 5.0–7.5)

## 2021-01-21 LAB — URINE CULTURE

## 2021-01-24 ENCOUNTER — Other Ambulatory Visit: Payer: Self-pay

## 2021-01-24 ENCOUNTER — Ambulatory Visit: Payer: 59

## 2021-01-24 ENCOUNTER — Ambulatory Visit: Payer: 59 | Admitting: Physical Medicine and Rehabilitation

## 2021-01-24 DIAGNOSIS — R2689 Other abnormalities of gait and mobility: Secondary | ICD-10-CM | POA: Diagnosis not present

## 2021-01-24 DIAGNOSIS — M6281 Muscle weakness (generalized): Secondary | ICD-10-CM

## 2021-01-24 DIAGNOSIS — R278 Other lack of coordination: Secondary | ICD-10-CM

## 2021-01-24 DIAGNOSIS — R2681 Unsteadiness on feet: Secondary | ICD-10-CM

## 2021-01-24 NOTE — Therapy (Signed)
[place] MAIN [facility] SERVICES [address] [place], [place], [address] Phone: [phone]   Fax:  [phone]  Physical Therapy Treatment/RECERT  Patient Details  Name: Vanessa Medina MRN: [id number] Date of Birth: 08-23-1971 Referring Provider (PT): Dr. Erling Cruz   Encounter Date: 01/24/2021   PT End of Session - 01/24/21 1436    Visit Number 37    Number of Visits 62    Date for PT Re-Evaluation 04/18/21    Authorization Type [employer] Other    Authorization Time Period 01/24/21-04/18/2021    PT Start Time 1432    PT Stop Time 1514    PT Time Calculation (min) 42 min    Equipment Utilized During Treatment Gait belt    Activity Tolerance Patient tolerated treatment well    Behavior During Therapy [facility] for tasks assessed/performed           Past Medical History:  Diagnosis Date  . Anemia   . Cervical cancer ([place])   . Family history of adverse reaction to anesthesia     " my mother takes a long time time wake up"  . GERD (gastroesophageal reflux disease)   . Headache    migraine  . History of shingles   . Hypertension   . Migraine   . Multiple allergies   . Obesity   . Osteoarthritis    left hip  . PONV (postoperative nausea and vomiting)   . Sleep apnea    does not wear CPAP  . Wears glasses     Past Surgical History:  Procedure Laterality Date  . ABDOMINAL HYSTERECTOMY    . APPENDECTOMY    . BILIOPANCREATIC DIVERSION     with duodenal switch laparoscopic   . CHOLECYSTECTOMY    . DIAGNOSTIC LAPAROSCOPY     LOA  . ESOPHAGOGASTRODUODENOSCOPY (EGD) WITH PROPOFOL N/A 07/25/2017   Procedure: ESOPHAGOGASTRODUODENOSCOPY (EGD) WITH PROPOFOL;  Surgeon: Manya Silvas, MD;  Location[facility] ENDOSCOPY;  Service: Endoscopy;  Laterality: N/A;  . KNEE ARTHROSCOPY    . LAPAROSCOPIC GASTRIC SLEEVE RESECTION WITH HIATAL HERNIA REPAIR    . TONSILLECTOMY    . TOTAL HIP ARTHROPLASTY Left 01/26/2016   Procedure: LEFT TOTAL HIP ARTHROPLASTY  ANTERIOR APPROACH;  Surgeon: Leandrew Koyanagi, MD;  Location: [place];  Service: Orthopedics;  Laterality: Left;  . TRANSFORAMINAL LUMBAR INTERBODY FUSION (TLIF) WITH PEDICLE SCREW FIXATION 3 LEVEL  08/2019   Basil Dess, MD L2-L5    There were no vitals filed for this visit.   Subjective Assessment - 01/24/21 1435    Subjective Pt reports she is having a good day with regards to her pain. Balance remains her biggest issue.    Pertinent History 50 year old female with history of HTN, migraines, morbid obesity--BMI-41, chronic hypotension, multiple back surgeries with chronic pain with LLE lumbar radiculopathy who was scheduled to have spinal cord stimulator placed by Dr. Davy Pique on 08/11/2020 but unable to perform surgery due to scar tissue.  History taken from patient and chart review.  Post op on awakening, she had excruciating pain RLE with plegia and and allodynia. She was admitted for work up by Dr. Ronnald Ramp from surgical center on 08/11/2020. Thoracic MRI done revealing subtle increased T2/STIR intensity distal cord at T12-L1 level suspicious for edema/acute spinal cord injury, no CSF leak as well as prior PLIF L2-L5 with residual foraminal protrusion L3/5 potentially irritating right L3 nerve. She was started on IV Dilaudid, gabapentin as well as IV Decadron for management  of pain, headaches and severe neuropathy.  She continues to have pain in RLE but is having some motor return, has constant headache, decreased hearing in right> left ear, constipation with nausea as well as epigastric pain and weakness. Therapy ongoing and CIR recommended due to functional decline.  Of note, she reports 50 lbs weigh loss in the past 3-4 months--due to issues with N/V/intake. Has had two falls last month---no recall of incident leading to fall question due to syncope. Was in process of work up for renal disease. She has had issue with LLE weakness with pain for the past year and being followed by Dr. Ernestina Patches. Did well with  stimulator trial.    How long can you sit comfortably? 1 hour (no recent change)    How long can you stand comfortably? 8 mins (no recent change)    How long can you walk comfortably? 20-30 minutes (no recent change)    Currently in Pain? No/denies    Pain Onset More than a month ago   pt with chronic back pain from over a month ago but with acute pain as well due to recent fall at home          ORTHOSTATICS  BP: 95/51 mm Hg  HR: 71 BPM  SPO2: 98%  Seated marches and LQ's, BP was reassessed: 102/61 mm Hg  GOALS   5xSTS: 27.2 sec  TUG: 24.6 sec with SPC.  10 m gait speed: 0.456 m/s DHI: 72%, indicative of severe handicap. FOTO: 51/64   Refer to clinical impression and long term goals for updates on goals.    PT Education - 01/24/21 1546    Education Details POC going forward and progress towards goals.    Person(s) Educated Patient    Methods Explanation    Comprehension Verbalized understanding            PT Short Term Goals - 01/24/21 1437      PT SHORT TERM GOAL #1   Title Pt will be independent with HEP in order to improve strength and balance in order to decrease fall risk and improve function at home and work.    Baseline 10/14/2020 on-going/deferred; 10/28/2020 ongoing pt has resumed HEP, reports doing what she can/HEP to be advanced; 11/18/2020  Pt has had trouble doing HEP d/t fatigue.; 5/23: Pt reports independence    Time 6    Period Weeks    Status Achieved    Target Date 01/24/21             PT Long Term Goals - 01/24/21 1552      PT LONG TERM GOAL #1   Title Patient (< 37 years old) will complete five times sit to stand test in < 10 seconds indicating an increased LE strength and improved balance.    Baseline 12/29: 38.45s; 10/14/2020 42.8 seconds; 10/28/2020 36 seconds; 11/18/2020 34.84 seconds; 12/30/20 41.55sec; 5/23: 27.2 sec    Time 12    Period Weeks    Status On-going    Target Date 04/18/21      PT LONG TERM GOAL #2   Title Pt will  decrease TUG to below 14 seconds/decrease in order to demonstrate decreased fall risk.    Baseline 12/29: 32.94s; 10/14/2020 43.12 sec; 10/28/2020 34.12 seconds; 11/18/2020 26.5 sec; 12/30/20: 31sec c SPC; 5/23: 24.6 with SPC    Time 12    Period Weeks    Status On-going    Target Date 04/18/21      PT  LONG TERM GOAL #3   Title Pt will decrease DHI score by at least 18 points in order to demonstrate clinically significant reduction in disability    Baseline 12/29: 30; 10/14/2020 62, indicating severe handicap; 10/28/2020 74' 5/23: 72 indicating severe handicap   Not reassessed, but reports no significant change/improvement with dizziness   Time 12    Period Weeks    Status On-going    Target Date 04/18/21      PT LONG TERM GOAL #4   Title Patient will increase 10 meter walk test to >1.45m/s as to improve gait speed for better community ambulation and to reduce fall risk.    Baseline 12/29: 0.37 m/s; 0.38 m/s; 2/10/08/2020 0.31 m/s 11/18/2020 0.34 m/s with SPC; 0.29m/s 12/30/20; 5/23: 0.45 m/s with SPC    Time 12    Period Weeks    Status On-going    Target Date 04/18/21      PT LONG TERM GOAL #5   Title Patient will increase FOTO score to equal to or greater than 64 to demonstrate statistically significant improvement in mobility and quality of life.    Baseline 12/29: 52; 10/14/2020 45; 10/28/2020 45; 11/18/2020 45; 12/30/20 44; 5/23: 51    Time 12    Period Weeks    Status On-going    Target Date 04/18/21                 Plan - 01/24/21 1548    Clinical Impression Statement Pt progressing in her current POC towards long term goals. Making progress in FOTO, TUG, 5xSTS, and 10 m gait speed indicative of improvements in LE strength and functional mobility with decreased risk of falls. Pt is however remaining in the sever handicap with DHI with her vertigo and dizziness symptoms which pt reports she has been followed by her rehab MD and ENT. Although pt progressing towards goals, none have  been accomplished so pt educated on further PT to maximize capacity to further improve functional mobility. PT will continue additional 12 week POC.    Personal Factors and Comorbidities Comorbidity 3+;Time since onset of injury/illness/exacerbation    Comorbidities HYPOtension, paraparesis, SCI without spinal bone injury, DDD, cervical cancer, acute kidney injury, GERD, asthma, bradycardia, L total hip replacement, hypokalemia    Examination-Activity Limitations Squat;Lift;Stairs;Bend;Stand;Reach Overhead;Carry;Transfers    Examination-Participation Restrictions Cleaning;Laundry;Meal Prep;Community Activity;Driving;Occupation    Stability/Clinical Decision Making Evolving/Moderate complexity    Rehab Potential Fair    PT Frequency 3x / week    PT Duration 12 weeks    PT Treatment/Interventions ADLs/Self Care Home Management;Aquatic Therapy;Gait training;Stair training;Functional mobility training;Therapeutic activities;Therapeutic exercise;Balance training;Moist Heat;Manual techniques;Patient/family education;Neuromuscular re-education;Passive range of motion;Energy conservation;Vestibular;Joint Manipulations;Spinal Manipulations    PT Next Visit Plan LE stretching for pain modulation and improved hip mobility; trial treadmill walking, gait training; step-length/gait technique training; nustep for endurance; back mobility therex, progress treadmill endurance and gait training; STM to cervical paraspinals and upper trap/levator scap, stair training, car transfers, reassess goals    PT Home Exercise Plan PROGRESS note: add new HEP, initiate UBE bike for cardiovasc endurance, R ankle motor control/strength, focus on STS and ambulation; added 11/18/2020 (handout) hip adduction, thoracic lumbar extension and sit<>stand with counter support    Consulted and Agree with Plan of Care Patient           Patient will benefit from skilled therapeutic intervention in order to improve the following deficits and  impairments:  Abnormal gait,Dizziness,Improper body mechanics,Decreased mobility,Cardiopulmonary status limiting activity,Decreased activity tolerance,Decreased endurance,Decreased range  of motion,Decreased strength,Decreased balance,Decreased safety awareness,Difficulty walking,Impaired flexibility,Obesity  Visit Diagnosis: Muscle weakness (generalized)  Unsteadiness on feet  Other lack of coordination  Other abnormalities of gait and mobility     Problem List Patient Active Problem List   Diagnosis Date Noted  . Nerve pain 10/18/2020  . Incomplete paraplegia ([place]) 10/18/2020  . Orthostatic hypotension 10/18/2020  . Chronic migraine without aura without status migrainosus, not intractable   . Hypokalemia   . Adjustment reaction with anxiety and depression   . Spinal cord injury, lumbar, without spinal bone injury, sequela ([place]) 08/18/2020  . Paraparesis ([place])   . Post-operative pain   . Hypotension   . Slow transit constipation   . AKI (acute kidney injury) ([place])   . Right leg weakness 08/11/2020  . DDD (degenerative disc disease), lumbar 09/02/2019    Class: Chronic  . Degenerative disc disease, lumbar 09/02/2019  . Chest tightness 09/23/2018  . Bradycardia 09/23/2018  . Labile blood pressure 03/08/2018  . Chronic low back pain 09/03/2017  . History of total hip replacement, left 01/26/2016  . EDEMA 04/28/2008  . LEG PAIN, BILATERAL 12/16/2007  . CERVICAL CANCER 07/02/2007  . MORBID OBESITY 07/02/2007  . DEPRESSION 07/02/2007  . COMMON MIGRAINE 07/02/2007  . Allergic rhinitis 07/02/2007  . ASTHMA 07/02/2007  . GERD 07/02/2007  . ELEVATED BLOOD PRESSURE WITHOUT DIAGNOSIS OF HYPERTENSION 07/02/2007    Salem Caster. Fairly IV, PT, DPT Physical Therapist- [facility]  01/24/2021, 3:56 PM  [place] MAIN [facility] SERVICES [address] [place], [place], [address] Phone: [phone]   Fax:   [phone]  Name: VENORA KAUTZMAN MRN: [id number] Date of Birth: 06-12-1971

## 2021-01-26 ENCOUNTER — Ambulatory Visit: Payer: 59

## 2021-01-26 ENCOUNTER — Other Ambulatory Visit: Payer: Self-pay

## 2021-01-26 DIAGNOSIS — R2689 Other abnormalities of gait and mobility: Secondary | ICD-10-CM

## 2021-01-26 DIAGNOSIS — R2681 Unsteadiness on feet: Secondary | ICD-10-CM

## 2021-01-26 NOTE — Therapy (Signed)
[place] MAIN [facility] SERVICES [address] [place], [place], [address] Phone: [phone]   Fax:  [phone]  Physical Therapy Treatment  Patient Details  Name: Vanessa Medina MRN: [id number] Date of Birth: [birth year] Referring Provider (PT): Dr. Erling Cruz   Encounter Date: 01/26/2021   PT End of Session - 01/26/21 1722    Visit Number 38    Number of Visits 96    Date for PT Re-Evaluation 04/18/21    Authorization Type [employer] Other    Authorization Time Period 01/24/21-04/18/2021    PT Start Time 1517    PT Stop Time 1600    PT Time Calculation (min) 43 min    Equipment Utilized During Treatment Gait belt    Activity Tolerance Patient tolerated treatment well    Behavior During Therapy [facility] for tasks assessed/performed           Past Medical History:  Diagnosis Date  . Anemia   . Cervical cancer ([place])   . Family history of adverse reaction to anesthesia     " my mother takes a long time time wake up"  . GERD (gastroesophageal reflux disease)   . Headache    migraine  . History of shingles   . Hypertension   . Migraine   . Multiple allergies   . Obesity   . Osteoarthritis    left hip  . PONV (postoperative nausea and vomiting)   . Sleep apnea    does not wear CPAP  . Wears glasses     Past Surgical History:  Procedure Laterality Date  . ABDOMINAL HYSTERECTOMY    . APPENDECTOMY    . BILIOPANCREATIC DIVERSION     with duodenal switch laparoscopic   . CHOLECYSTECTOMY    . DIAGNOSTIC LAPAROSCOPY     LOA  . ESOPHAGOGASTRODUODENOSCOPY (EGD) WITH PROPOFOL N/A 07/25/2017   Procedure: ESOPHAGOGASTRODUODENOSCOPY (EGD) WITH PROPOFOL;  Surgeon: Manya Silvas, MD;  Location: [facility] ENDOSCOPY;  Service: Endoscopy;  Laterality: N/A;  . KNEE ARTHROSCOPY    . LAPAROSCOPIC GASTRIC SLEEVE RESECTION WITH HIATAL HERNIA REPAIR    . TONSILLECTOMY    . TOTAL HIP ARTHROPLASTY Left 01/26/2016   Procedure: LEFT TOTAL HIP ARTHROPLASTY ANTERIOR  APPROACH;  Surgeon: Leandrew Koyanagi, MD;  Location: [place];  Service: Orthopedics;  Laterality: Left;  . TRANSFORAMINAL LUMBAR INTERBODY FUSION (TLIF) WITH PEDICLE SCREW FIXATION 3 LEVEL  08/2019   Basil Dess, MD L2-L5    There were no vitals filed for this visit.   Subjective Assessment - 01/26/21 1721    Subjective Pt reports she is still having spinning and some fluttering. She does report the "fluttering" has improved since injections into shoulder muscles, which she reports will be repeated in a few weeks.    Pertinent History 49 year old female with history of HTN, migraines, morbid obesity--BMI-41, chronic hypotension, multiple back surgeries with chronic pain with LLE lumbar radiculopathy who was scheduled to have spinal cord stimulator placed by Dr. Davy Pique on 08/11/2020 but unable to perform surgery due to scar tissue.  History taken from patient and chart review.  Post op on awakening, she had excruciating pain RLE with plegia and and allodynia. She was admitted for work up by Dr. Ronnald Ramp from surgical center on 08/11/2020. Thoracic MRI done revealing subtle increased T2/STIR intensity distal cord at T12-L1 level suspicious for edema/acute spinal cord injury, no CSF leak as well as prior PLIF L2-L5 with residual foraminal protrusion L3/5 potentially irritating right L3 nerve.  She was started on IV Dilaudid, gabapentin as well as IV Decadron for management of pain, headaches and severe neuropathy.  She continues to have pain in RLE but is having some motor return, has constant headache, decreased hearing in right> left ear, constipation with nausea as well as epigastric pain and weakness. Therapy ongoing and CIR recommended due to functional decline.  Of note, she reports 50 lbs weigh loss in the past 3-4 months--due to issues with N/V/intake. Has had two falls last month---no recall of incident leading to fall question due to syncope. Was in process of work up for renal disease. She has had issue with  LLE weakness with pain for the past year and being followed by Dr. Ernestina Patches. Did well with stimulator trial.    How long can you sit comfortably? 1 hour (no recent change)    How long can you stand comfortably? 8 mins (no recent change)    How long can you walk comfortably? 20-30 minutes (no recent change)    Currently in Pain? --   not reported   Pain Onset More than a month ago   pt with chronic back pain from over a month ago but with acute pain as well due to recent fall at home          Treatment:  Neuro Re-ed:  Pt continues to report spinning with positional changes. Session focused on assessing pt for possible BPPV due to sx being reported.  Dix-Hallpike tested both R and L sides:  - R side 2x: pt reports 9/10 dizziness on R side, a few possible beats present/unclear if nystagmus present. Pt reports spinning and nausea. With first assessment pt dizziness lasted approx 1 minute. With second assessment went straight into epley maneuver, however, pt dizziness then proceeded to last approx 2.5 min. - L side: pt negative L side, no nystagmus and reports 0/10 dizziness, no sx.    Epley maneuver to R side - 1x. Pt dizziness initially lasts 2.5 minutes when maneuver initiated. No nystagmus present. Pt does report seeing "bouncing" and "spinning." At end of maneuver she reports dizziness at 5/10, which she reports is her baseline.   Head impulse testing: positive to R, undershooting and corrective saccade.    Pt educated throughout on technique with testing/maneuvers and purpose of testing, indications.   Assessment: Dix-Hallpike testing performed this session. Pt reports no sx when testing L side and no nystagmus present. Pt with 9/10 dizziness and reports spinning and bouncing sensation when testing R side, initially with symptoms lasting approx 1 minute, unclear if brief nystagmus present. Pt also reported feeling nauseous, which resolved once testing completed. However, when Epley maneuver  performed for R side pt dizziness lasted approximately two and a half minutes (no nystagmus observed). Pt did report 5/10 dizziness after Epley maneuver, which she reported is her baseline. Pt has positive R side Head Impulse testing as well. Further assessment to be completed next session. The pt will benefit from further skilled therapy to address these impairments as well as her balance and gait to increase safety with all functional mobility.    PT Education - 01/26/21 1722    Education Details explanation of Dix-Hallpike, Epley maneuver    Person(s) Educated Patient    Methods Explanation    Comprehension Verbalized understanding            PT Short Term Goals - 01/24/21 1437      PT SHORT TERM GOAL #1   Title Pt will be  independent with HEP in order to improve strength and balance in order to decrease fall risk and improve function at home and work.    Baseline 10/14/2020 on-going/deferred; 10/28/2020 ongoing pt has resumed HEP, reports doing what she can/HEP to be advanced; 11/18/2020  Pt has had trouble doing HEP d/t fatigue.; 5/23: Pt reports independence    Time 6    Period Weeks    Status Achieved    Target Date 01/24/21             PT Long Term Goals - 01/24/21 1552      PT LONG TERM GOAL #1   Title Patient (< 26 years old) will complete five times sit to stand test in < 10 seconds indicating an increased LE strength and improved balance.    Baseline 12/29: 38.45s; 10/14/2020 42.8 seconds; 10/28/2020 36 seconds; 11/18/2020 34.84 seconds; 12/30/20 41.55sec; 5/23: 27.2 sec    Time 12    Period Weeks    Status On-going    Target Date 04/18/21      PT LONG TERM GOAL #2   Title Pt will decrease TUG to below 14 seconds/decrease in order to demonstrate decreased fall risk.    Baseline 12/29: 32.94s; 10/14/2020 43.12 sec; 10/28/2020 34.12 seconds; 11/18/2020 26.5 sec; 12/30/20: 31sec c SPC; 5/23: 24.6 with SPC    Time 12    Period Weeks    Status On-going    Target Date 04/18/21       PT LONG TERM GOAL #3   Title Pt will decrease DHI score by at least 18 points in order to demonstrate clinically significant reduction in disability    Baseline 12/29: 30; 10/14/2020 62, indicating severe handicap; 10/28/2020 74' 5/23: 72 indicating severe handicap   Not reassessed, but reports no significant change/improvement with dizziness   Time 12    Period Weeks    Status On-going    Target Date 04/18/21      PT LONG TERM GOAL #4   Title Patient will increase 10 meter walk test to >1.70m/s as to improve gait speed for better community ambulation and to reduce fall risk.    Baseline 12/29: 0.37 m/s; 0.38 m/s; 2/10/08/2020 0.31 m/s 11/18/2020 0.34 m/s with SPC; 0.44m/s 12/30/20; 5/23: 0.45 m/s with SPC    Time 12    Period Weeks    Status On-going    Target Date 04/18/21      PT LONG TERM GOAL #5   Title Patient will increase FOTO score to equal to or greater than 64 to demonstrate statistically significant improvement in mobility and quality of life.    Baseline 12/29: 52; 10/14/2020 45; 10/28/2020 45; 11/18/2020 45; 12/30/20 44; 5/23: 51    Time 12    Period Weeks    Status On-going    Target Date 04/18/21                 Plan - 01/26/21 1736    Clinical Impression Statement Dix-Hallpike testing performed this session. Pt reports no sx when testing L side and no nystagmus present. Pt with 9/10 dizziness and reports spinning and bouncing sensation when testing R side, initially with symptoms lasting approx 1 minute, unclear if brief nystagmus present. Pt also reported feeling nauseous, which resolved once testing completed. However, when Epley maneuver performed for R side pt dizziness lasted approximately two and a half minutes (no nystagmus observed). Pt did report 5/10 dizziness after Epley maneuver, which she reported is her baseline. Pt has positive  R side Head Impulse testing as well. Further assessment to be completed next session. The pt will benefit from further skilled  therapy to address these impairments as well as her balance and gait to increase safety with all functional mobility.    Personal Factors and Comorbidities Comorbidity 3+;Time since onset of injury/illness/exacerbation    Comorbidities HYPOtension, paraparesis, SCI without spinal bone injury, DDD, cervical cancer, acute kidney injury, GERD, asthma, bradycardia, L total hip replacement, hypokalemia    Examination-Activity Limitations Squat;Lift;Stairs;Bend;Stand;Reach Overhead;Carry;Transfers    Examination-Participation Restrictions Cleaning;Laundry;Meal Prep;Community Activity;Driving;Occupation    Stability/Clinical Decision Making Evolving/Moderate complexity    Rehab Potential Fair    PT Frequency 3x / week    PT Duration 12 weeks    PT Treatment/Interventions ADLs/Self Care Home Management;Aquatic Therapy;Gait training;Stair training;Functional mobility training;Therapeutic activities;Therapeutic exercise;Balance training;Moist Heat;Manual techniques;Patient/family education;Neuromuscular re-education;Passive range of motion;Energy conservation;Vestibular;Joint Manipulations;Spinal Manipulations    PT Next Visit Plan LE stretching for pain modulation and improved hip mobility; trial treadmill walking, gait training; step-length/gait technique training; nustep for endurance; back mobility therex, progress treadmill endurance and gait training; STM to cervical paraspinals and upper trap/levator scap, stair training, car transfers, reassess goals, roll testing    PT Home Exercise Plan PROGRESS note: add new HEP, initiate UBE bike for cardiovasc endurance, R ankle motor control/strength, focus on STS and ambulation; added 11/18/2020 (handout) hip adduction, thoracic lumbar extension and sit<>stand with counter support    Consulted and Agree with Plan of Care Patient           Patient will benefit from skilled therapeutic intervention in order to improve the following deficits and impairments:   Abnormal gait,Dizziness,Improper body mechanics,Decreased mobility,Cardiopulmonary status limiting activity,Decreased activity tolerance,Decreased endurance,Decreased range of motion,Decreased strength,Decreased balance,Decreased safety awareness,Difficulty walking,Impaired flexibility,Obesity  Visit Diagnosis: Unsteadiness on feet  Other abnormalities of gait and mobility     Problem List Patient Active Problem List   Diagnosis Date Noted  . Nerve pain 10/18/2020  . Incomplete paraplegia ([place]) 10/18/2020  . Orthostatic hypotension 10/18/2020  . Chronic migraine without aura without status migrainosus, not intractable   . Hypokalemia   . Adjustment reaction with anxiety and depression   . Spinal cord injury, lumbar, without spinal bone injury, sequela ([place]) 08/18/2020  . Paraparesis ([place])   . Post-operative pain   . Hypotension   . Slow transit constipation   . AKI (acute kidney injury) ([place])   . Right leg weakness 08/11/2020  . DDD (degenerative disc disease), lumbar 09/02/2019    Class: Chronic  . Degenerative disc disease, lumbar 09/02/2019  . Chest tightness 09/23/2018  . Bradycardia 09/23/2018  . Labile blood pressure 03/08/2018  . Chronic low back pain 09/03/2017  . History of total hip replacement, left 01/26/2016  . EDEMA 04/28/2008  . LEG PAIN, BILATERAL 12/16/2007  . CERVICAL CANCER 07/02/2007  . MORBID OBESITY 07/02/2007  . DEPRESSION 07/02/2007  . COMMON MIGRAINE 07/02/2007  . Allergic rhinitis 07/02/2007  . ASTHMA 07/02/2007  . GERD 07/02/2007  . ELEVATED BLOOD PRESSURE WITHOUT DIAGNOSIS OF HYPERTENSION 07/02/2007   Ricard Dillon PT, DPT 01/26/2021, 5:38 PM  [place] MAIN [facility] SERVICES [address] [place], [place], [address] Phone: [phone]   Fax:  [phone]  Name: Vanessa Medina MRN: [id number] Date of Birth: April 23, 1971

## 2021-01-27 ENCOUNTER — Telehealth: Payer: Self-pay

## 2021-01-27 NOTE — Telephone Encounter (Signed)
Returning your call about results.

## 2021-02-02 ENCOUNTER — Ambulatory Visit: Payer: 59 | Attending: Physical Medicine and Rehabilitation

## 2021-02-02 ENCOUNTER — Other Ambulatory Visit: Payer: Self-pay

## 2021-02-02 DIAGNOSIS — R2689 Other abnormalities of gait and mobility: Secondary | ICD-10-CM | POA: Diagnosis present

## 2021-02-02 DIAGNOSIS — M545 Low back pain, unspecified: Secondary | ICD-10-CM | POA: Insufficient documentation

## 2021-02-02 DIAGNOSIS — R2681 Unsteadiness on feet: Secondary | ICD-10-CM | POA: Insufficient documentation

## 2021-02-02 DIAGNOSIS — R278 Other lack of coordination: Secondary | ICD-10-CM | POA: Insufficient documentation

## 2021-02-02 DIAGNOSIS — M5441 Lumbago with sciatica, right side: Secondary | ICD-10-CM | POA: Diagnosis present

## 2021-02-02 DIAGNOSIS — M6281 Muscle weakness (generalized): Secondary | ICD-10-CM | POA: Diagnosis present

## 2021-02-02 DIAGNOSIS — G8929 Other chronic pain: Secondary | ICD-10-CM | POA: Diagnosis present

## 2021-02-02 DIAGNOSIS — R42 Dizziness and giddiness: Secondary | ICD-10-CM | POA: Diagnosis present

## 2021-02-02 NOTE — Therapy (Signed)
[place] MAIN [facility] SERVICES [address] [place], [place], [address] Phone: [phone]   Fax:  [phone]  Physical Therapy Treatment  Patient Details  Name: Vanessa Medina MRN: [id number] Date of Birth: July 14, 1971 Referring Provider (PT): Dr. Erling Cruz   Encounter Date: 02/02/2021   PT End of Session - 02/02/21 1732    Visit Number 39    Number of Visits 54    Date for PT Re-Evaluation 04/18/21    Authorization Type [employer] Other    Authorization Time Period 01/24/21-04/18/2021    PT Start Time 1347    PT Stop Time 1431    PT Time Calculation (min) 44 min    Equipment Utilized During Treatment Gait belt    Activity Tolerance Patient tolerated treatment well    Behavior During Therapy [facility] for tasks assessed/performed           Past Medical History:  Diagnosis Date  . Anemia   . Cervical cancer ([place])   . Family history of adverse reaction to anesthesia     " my mother takes a long time time wake up"  . GERD (gastroesophageal reflux disease)   . Headache    migraine  . History of shingles   . Hypertension   . Migraine   . Multiple allergies   . Obesity   . Osteoarthritis    left hip  . PONV (postoperative nausea and vomiting)   . Sleep apnea    does not wear CPAP  . Wears glasses     Past Surgical History:  Procedure Laterality Date  . ABDOMINAL HYSTERECTOMY    . APPENDECTOMY    . BILIOPANCREATIC DIVERSION     with duodenal switch laparoscopic   . CHOLECYSTECTOMY    . DIAGNOSTIC LAPAROSCOPY     LOA  . ESOPHAGOGASTRODUODENOSCOPY (EGD) WITH PROPOFOL N/A 07/25/2017   Procedure: ESOPHAGOGASTRODUODENOSCOPY (EGD) WITH PROPOFOL;  Surgeon: Manya Silvas, MD;  Location: [facility] ENDOSCOPY;  Service: Endoscopy;  Laterality: N/A;  . KNEE ARTHROSCOPY    . LAPAROSCOPIC GASTRIC SLEEVE RESECTION WITH HIATAL HERNIA REPAIR    . TONSILLECTOMY    . TOTAL HIP ARTHROPLASTY Left 01/26/2016   Procedure: LEFT TOTAL HIP ARTHROPLASTY ANTERIOR  APPROACH;  Surgeon: Leandrew Koyanagi, MD;  Location: [place];  Service: Orthopedics;  Laterality: Left;  . TRANSFORAMINAL LUMBAR INTERBODY FUSION (TLIF) WITH PEDICLE SCREW FIXATION 3 LEVEL  08/2019   Basil Dess, MD L2-L5    There were no vitals filed for this visit.   Subjective Assessment - 02/02/21 1349    Subjective Pt reports she had "10+ vertigo yesterday." She reports sensation of spinning and that she had nasuea and vomiting. She said the episode lasted from 6 am to 3 pm. She reports migraine HA, light sensitivity at time as well. Pt reports she also had tremors yesterday. She said when vertigo was worse with turning her head to the right but not to the L side. She reports also had fluttering sound in ears. Pt reports she stayed in her recliner all day yesterday due to dizziness and N/V, headache. She said she feels much better today. She reports since her sx had improved by evening she was able to sleep well. She reports current R ear ache. Pt reports she has been having 12-15 migraines per month.    Pertinent History 50 year old female with history of HTN, migraines, morbid obesity--BMI-41, chronic hypotension, multiple back surgeries with chronic pain with LLE lumbar radiculopathy who was  scheduled to have spinal cord stimulator placed by Dr. Davy Pique on 08/11/2020 but unable to perform surgery due to scar tissue.  History taken from patient and chart review.  Post op on awakening, she had excruciating pain RLE with plegia and and allodynia. She was admitted for work up by Dr. Ronnald Ramp from surgical center on 08/11/2020. Thoracic MRI done revealing subtle increased T2/STIR intensity distal cord at T12-L1 level suspicious for edema/acute spinal cord injury, no CSF leak as well as prior PLIF L2-L5 with residual foraminal protrusion L3/5 potentially irritating right L3 nerve. She was started on IV Dilaudid, gabapentin as well as IV Decadron for management of pain, headaches and severe neuropathy.  She continues  to have pain in RLE but is having some motor return, has constant headache, decreased hearing in right> left ear, constipation with nausea as well as epigastric pain and weakness. Therapy ongoing and CIR recommended due to functional decline.  Of note, she reports 50 lbs weigh loss in the past 3-4 months--due to issues with N/V/intake. Has had two falls last month---no recall of incident leading to fall question due to syncope. Was in process of work up for renal disease. She has had issue with LLE weakness with pain for the past year and being followed by Dr. Ernestina Patches. Did well with stimulator trial.    How long can you sit comfortably? 1 hour (no recent change)    How long can you stand comfortably? 8 mins (no recent change)    How long can you walk comfortably? 20-30 minutes (no recent change)    Currently in Pain? Yes    Pain Location Ear    Pain Orientation Right    Pain Onset More than a month ago   pt with chronic back pain from over a month ago but with acute pain as well due to recent fall at home         TREATMENT  Neuro Re-Education:  Further vestibular assessment completed:  Dix hallpike to R: negative  Roll testing:  R side: negative L side: negative  Pt requires min assist x1 throughout all vestibular testing for set-up and correct positioning for safety and d/t pt difficulty with bed mobility related tasks.   PT and vestibular PT present. Pt provided educations on findings of testing in the context of pt symptoms and PMH. PTs recommend for pt to follow-up with her neurologist to address frequency of migraines and dizziness sx occurring with migraine. Pt verbalized understanding.  PT further educated pt on activity modification/safety following testing and for when pt has episodes of dizziness/vertigo. Pt verbalized understanding.    Further interventions:    With SPC/UUE support and CGA: Standing NBOS -2x60 sec Standing WBOS with weight-shifts forward/backward,  side-to-side - 2x10 for each direction. Pt greater difficulty with weight-shifts forward/backward. Standing WBOS with horizontal head turns - 2x10 each direction. Pt performs slowly. Slight increase in sway.      PT Education - 02/02/21 1731    Education Details explanation/purpose of roll-testing    Person(s) Educated Patient    Methods Explanation    Comprehension Verbalized understanding            PT Short Term Goals - 01/24/21 1437      PT SHORT TERM GOAL #1   Title Pt will be independent with HEP in order to improve strength and balance in order to decrease fall risk and improve function at home and work.    Baseline 10/14/2020 on-going/deferred; 10/28/2020 ongoing pt has  resumed HEP, reports doing what she can/HEP to be advanced; 11/18/2020  Pt has had trouble doing HEP d/t fatigue.; 5/23: Pt reports independence    Time 6    Period Weeks    Status Achieved    Target Date 01/24/21             PT Long Term Goals - 01/24/21 1552      PT LONG TERM GOAL #1   Title Patient (< 8 years old) will complete five times sit to stand test in < 10 seconds indicating an increased LE strength and improved balance.    Baseline 12/29: 38.45s; 10/14/2020 42.8 seconds; 10/28/2020 36 seconds; 11/18/2020 34.84 seconds; 12/30/20 41.55sec; 5/23: 27.2 sec    Time 12    Period Weeks    Status On-going    Target Date 04/18/21      PT LONG TERM GOAL #2   Title Pt will decrease TUG to below 14 seconds/decrease in order to demonstrate decreased fall risk.    Baseline 12/29: 32.94s; 10/14/2020 43.12 sec; 10/28/2020 34.12 seconds; 11/18/2020 26.5 sec; 12/30/20: 31sec c SPC; 5/23: 24.6 with SPC    Time 12    Period Weeks    Status On-going    Target Date 04/18/21      PT LONG TERM GOAL #3   Title Pt will decrease DHI score by at least 18 points in order to demonstrate clinically significant reduction in disability    Baseline 12/29: 30; 10/14/2020 62, indicating severe handicap; 10/28/2020 74' 5/23: 72  indicating severe handicap   Not reassessed, but reports no significant change/improvement with dizziness   Time 12    Period Weeks    Status On-going    Target Date 04/18/21      PT LONG TERM GOAL #4   Title Patient will increase 10 meter walk test to >1.52m/s as to improve gait speed for better community ambulation and to reduce fall risk.    Baseline 12/29: 0.37 m/s; 0.38 m/s; 2/10/08/2020 0.31 m/s 11/18/2020 0.34 m/s with SPC; 0.45m/s 12/30/20; 5/23: 0.45 m/s with SPC    Time 12    Period Weeks    Status On-going    Target Date 04/18/21      PT LONG TERM GOAL #5   Title Patient will increase FOTO score to equal to or greater than 64 to demonstrate statistically significant improvement in mobility and quality of life.    Baseline 12/29: 52; 10/14/2020 45; 10/28/2020 45; 11/18/2020 45; 12/30/20 44; 5/23: 51    Time 12    Period Weeks    Status On-going    Target Date 04/18/21                 Plan - 02/02/21 1732    Clinical Impression Statement Further vestibular assessment performed at this date d/t pt reports of 10/10 dizziness sx yesterday with N/V, HA. Vestibular PT present for Dix-hallpike and roll testing. Pt Dix-Hallpike on R today was negative. Roll testing was also negative B. Pt reports dizziness sx, N/V was concurrent with migraine HA. PT instructed pt to follow-up with her neurologist regarding vertigo sx that occur along with migraines, and to follow-up as pt is reporting 12-15 migraines per month. Pt verbalized understanding and reports she has appointment with her neurologist for the 27th of this month. PT also instructed pt on safe/sx-reducing positioning/acitivty modification when she has episodes of vertigo. Pt verbalizes understanding. The pt will benefit from further skilled PT to further address deficits of  dizziness, balance, generalized strength in order to increase safety with all funcitonal mobility and improve QOL.    Personal Factors and Comorbidities Comorbidity  3+;Time since onset of injury/illness/exacerbation    Comorbidities HYPOtension, paraparesis, SCI without spinal bone injury, DDD, cervical cancer, acute kidney injury, GERD, asthma, bradycardia, L total hip replacement, hypokalemia    Examination-Activity Limitations Squat;Lift;Stairs;Bend;Stand;Reach Overhead;Carry;Transfers    Examination-Participation Restrictions Cleaning;Laundry;Meal Prep;Community Activity;Driving;Occupation    Stability/Clinical Decision Making Evolving/Moderate complexity    Rehab Potential Fair    PT Frequency 3x / week    PT Duration 12 weeks    PT Treatment/Interventions ADLs/Self Care Home Management;Aquatic Therapy;Gait training;Stair training;Functional mobility training;Therapeutic activities;Therapeutic exercise;Balance training;Moist Heat;Manual techniques;Patient/family education;Neuromuscular re-education;Passive range of motion;Energy conservation;Vestibular;Joint Manipulations;Spinal Manipulations    PT Next Visit Plan endurance, dynamic and static balance    PT Home Exercise Plan PROGRESS note: add new HEP, initiate UBE bike for cardiovasc endurance, R ankle motor control/strength, focus on STS and ambulation; added 11/18/2020 (handout) hip adduction, thoracic lumbar extension and sit<>stand with counter support; no changes at this date    Consulted and Agree with Plan of Care Patient           Patient will benefit from skilled therapeutic intervention in order to improve the following deficits and impairments:  Abnormal gait,Dizziness,Improper body mechanics,Decreased mobility,Cardiopulmonary status limiting activity,Decreased activity tolerance,Decreased endurance,Decreased range of motion,Decreased strength,Decreased balance,Decreased safety awareness,Difficulty walking,Impaired flexibility,Obesity  Visit Diagnosis: Other abnormalities of gait and mobility  Dizziness and giddiness  Unsteadiness on feet     Problem List Patient Active Problem  List   Diagnosis Date Noted  . Nerve pain 10/18/2020  . Incomplete paraplegia ([place]) 10/18/2020  . Orthostatic hypotension 10/18/2020  . Chronic migraine without aura without status migrainosus, not intractable   . Hypokalemia   . Adjustment reaction with anxiety and depression   . Spinal cord injury, lumbar, without spinal bone injury, sequela ([place]) 08/18/2020  . Paraparesis ([place])   . Post-operative pain   . Hypotension   . Slow transit constipation   . AKI (acute kidney injury) ([place])   . Right leg weakness 08/11/2020  . DDD (degenerative disc disease), lumbar 09/02/2019    Class: Chronic  . Degenerative disc disease, lumbar 09/02/2019  . Chest tightness 09/23/2018  . Bradycardia 09/23/2018  . Labile blood pressure 03/08/2018  . Chronic low back pain 09/03/2017  . History of total hip replacement, left 01/26/2016  . EDEMA 04/28/2008  . LEG PAIN, BILATERAL 12/16/2007  . CERVICAL CANCER 07/02/2007  . MORBID OBESITY 07/02/2007  . DEPRESSION 07/02/2007  . COMMON MIGRAINE 07/02/2007  . Allergic rhinitis 07/02/2007  . ASTHMA 07/02/2007  . GERD 07/02/2007  . ELEVATED BLOOD PRESSURE WITHOUT DIAGNOSIS OF HYPERTENSION 07/02/2007   Ricard Dillon PT, DPT 02/02/2021, 5:45 PM  [place] MAIN [facility] SERVICES [address] [place], [place], [address] Phone: [phone]   Fax:  [phone]  Name: Vanessa Medina MRN: [id number] Date of Birth: [birth year]

## 2021-02-04 MED ORDER — CEPHALEXIN 500 MG PO CAPS: 500.0000 mg | ORAL_CAPSULE | Freq: Two times a day (BID) | ORAL | 0 refills | Status: AC

## 2021-02-04 NOTE — Telephone Encounter (Signed)
Actually has UTI base don Culture results- EColi and sensitive to everything-  Not allergic to Keflex that I could see- and doesn't remember what works for her- will send in Keflex 500 mg BID x 7 days - Also asked her to get a copy of disability paperwork and then it will make it easier to fill out each time if I have a copy of old one.  Pt will do so.

## 2021-02-07 ENCOUNTER — Other Ambulatory Visit: Payer: Self-pay

## 2021-02-07 ENCOUNTER — Encounter: Payer: Self-pay | Admitting: Student

## 2021-02-07 ENCOUNTER — Ambulatory Visit: Payer: 59

## 2021-02-07 DIAGNOSIS — R2681 Unsteadiness on feet: Secondary | ICD-10-CM

## 2021-02-07 DIAGNOSIS — R2689 Other abnormalities of gait and mobility: Secondary | ICD-10-CM | POA: Diagnosis not present

## 2021-02-07 DIAGNOSIS — M6281 Muscle weakness (generalized): Secondary | ICD-10-CM

## 2021-02-07 NOTE — Progress Notes (Signed)
[employer] paperwork completed and returned to [place].

## 2021-02-07 NOTE — Therapy (Signed)
[place] [facility] SERVICES [address] [place], [place], [address] Phone: [phone]   Fax:  [phone]  Physical Therapy Treatment/Physical Therapy Progress Note   Dates of reporting period  12/30/2020   to   02/07/2021   Patient Details  Name: Vanessa Medina MRN: [id number] Date of Birth: Feb 01, 1971 Referring Provider (PT): Dr. Erling Cruz   Encounter Date: 02/07/2021   PT End of Session - 02/08/21 0802    Visit Number 40    Number of Visits 53    Date for PT Re-Evaluation 04/18/21    Authorization Type [employer] Other    Authorization Time Period 01/24/21-04/18/2021    PT Start Time 1432    PT Stop Time 1517    PT Time Calculation (min) 45 min    Equipment Utilized During Treatment Gait belt    Activity Tolerance Patient tolerated treatment well    Behavior During Therapy [facility] for tasks assessed/performed           Past Medical History:  Diagnosis Date  . Anemia   . Cervical cancer ([place])   . Family history of adverse reaction to anesthesia     " my mother takes a long time time wake up"  . GERD (gastroesophageal reflux disease)   . Headache    migraine  . History of shingles   . Hypertension   . Migraine   . Multiple allergies   . Obesity   . Osteoarthritis    left hip  . PONV (postoperative nausea and vomiting)   . Sleep apnea    does not wear CPAP  . Wears glasses     Past Surgical History:  Procedure Laterality Date  . ABDOMINAL HYSTERECTOMY    . APPENDECTOMY    . BILIOPANCREATIC DIVERSION     with duodenal switch laparoscopic   . CHOLECYSTECTOMY    . DIAGNOSTIC LAPAROSCOPY     LOA  . ESOPHAGOGASTRODUODENOSCOPY (EGD) WITH PROPOFOL N/A 07/25/2017   Procedure: ESOPHAGOGASTRODUODENOSCOPY (EGD) WITH PROPOFOL;  Surgeon: Manya Silvas, MD;  Location: [facility] ENDOSCOPY;  Service: Endoscopy;  Laterality: N/A;  . KNEE ARTHROSCOPY    . LAPAROSCOPIC GASTRIC SLEEVE RESECTION WITH HIATAL HERNIA REPAIR    . TONSILLECTOMY    .  TOTAL HIP ARTHROPLASTY Left 01/26/2016   Procedure: LEFT TOTAL HIP ARTHROPLASTY ANTERIOR APPROACH;  Surgeon: Leandrew Koyanagi, MD;  Location: [facility];  Service: Orthopedics;  Laterality: Left;  . TRANSFORAMINAL LUMBAR INTERBODY FUSION (TLIF) WITH PEDICLE SCREW FIXATION 3 LEVEL  08/2019   Basil Dess, MD L2-L5    There were no vitals filed for this visit.   Subjective Assessment - 02/08/21 0800    Subjective Pt reports low back pain is currently 7-8/10. Pt reports 9/10 dizziness this past weekend, but that today she feels ok. Pt would like to focus on balance and being able to walk for longer. She reports she must sit down after about 20 minutes of walking.    Pertinent History 50 year old female with history of HTN, migraines, morbid obesity--BMI-41, chronic hypotension, multiple back surgeries with chronic pain with LLE lumbar radiculopathy who was scheduled to have spinal cord stimulator placed by Dr. Davy Pique on 08/11/2020 but unable to perform surgery due to scar tissue.  History taken from patient and chart review.  Post op on awakening, she had excruciating pain RLE with plegia and and allodynia. She was admitted for work up by Dr. Ronnald Ramp from surgical center on 08/11/2020. Thoracic MRI done revealing subtle  increased T2/STIR intensity distal cord at T12-L1 level suspicious for edema/acute spinal cord injury, no CSF leak as well as prior PLIF L2-L5 with residual foraminal protrusion L3/5 potentially irritating right L3 nerve. She was started on IV Dilaudid, gabapentin as well as IV Decadron for management of pain, headaches and severe neuropathy.  She continues to have pain in RLE but is having some motor return, has constant headache, decreased hearing in right> left ear, constipation with nausea as well as epigastric pain and weakness. Therapy ongoing and CIR recommended due to functional decline.  Of note, she reports 50 lbs weigh loss in the past 3-4 months--due to issues with N/V/intake. Has had two falls  last month---no recall of incident leading to fall question due to syncope. Was in process of work up for renal disease. She has had issue with LLE weakness with pain for the past year and being followed by Dr. Ernestina Patches. Did well with stimulator trial.    How long can you sit comfortably? 1 hour (no recent change)    How long can you stand comfortably? 8 mins (no recent change)    How long can you walk comfortably? 20-30 minutes (no recent change)    Pain Score 8     Pain Location Back    Pain Orientation Lower    Pain Onset More than a month ago   pt with chronic back pain from over a month ago but with acute pain as well due to recent fall at home         TREATMENT  Therex:   FOTO:45  6MWT - 788 ft with SPC   Amb. For endurance/gait technique/improved gait speed - x 3 laps around gym (414 ft) cuing for speed, and step-length, close CGA provided throughout. Pt exhibits hip drop.  Standing hip abduction 15 x each LE - VC/Demo for technique   Neuro Re-ed: CGA provided throughout  In // bars: Semi-tandem walking forward/backward -  X 10 reps  High knee marches with emphasis on SLB without UE support  X multiple reps length of // bars  Cone Heel taps ( 3 cones) - x multiple reps each LE, intermittent UE support  Tandem stance - 2x30 sec each LE; intermittent UE support  NBOS with horizontal head turns - 2x10 reps of looking R and L. Minor decrease in postural stability  Tandem stance with horizontal head turns - 2x10 reps looking R and L. Intermittent UE support.   Patient's condition has the potential to improve in response to therapy. Maximum improvement is yet to be obtained. The anticipated improvement is attainable and reasonable in a generally predictable time.    Education provided with VC/TC and demonstration for movement at target joints andto facilitatecorrect muscle activation with all exercises. Pt exhibits good carryover of technique within session.   PT  Education - 02/08/21 0801    Education Details exercise technique, body mechanics    Person(s) Educated Patient    Methods Explanation;Demonstration;Verbal cues    Comprehension Verbalized understanding;Returned demonstration            PT Short Term Goals - 02/08/21 0831      PT SHORT TERM GOAL #1   Title Pt will be independent with HEP in order to improve strength and balance in order to decrease fall risk and improve function at home and work.    Baseline 10/14/2020 on-going/deferred; 10/28/2020 ongoing pt has resumed HEP, reports doing what she can/HEP to be advanced; 11/18/2020  Pt has had  trouble doing HEP d/t fatigue.; 5/23: Pt reports independence    Time 6    Period Weeks    Status Achieved    Target Date 01/24/21             PT Long Term Goals - 02/08/21 0831      PT LONG TERM GOAL #1   Title Patient (< 110 years old) will complete five times sit to stand test in < 10 seconds indicating an increased LE strength and improved balance.    Baseline 12/29: 38.45s; 10/14/2020 42.8 seconds; 10/28/2020 36 seconds; 11/18/2020 34.84 seconds; 12/30/20 41.55sec; 5/23: 27.2 sec    Time 12    Period Weeks    Status On-going    Target Date 04/18/21      PT LONG TERM GOAL #2   Title Pt will decrease TUG to below 14 seconds/decrease in order to demonstrate decreased fall risk.    Baseline 12/29: 32.94s; 10/14/2020 43.12 sec; 10/28/2020 34.12 seconds; 11/18/2020 26.5 sec; 12/30/20: 31sec c SPC; 5/23: 24.6 with SPC    Time 12    Period Weeks    Status On-going    Target Date 04/18/21      PT LONG TERM GOAL #3   Title Pt will decrease DHI score by at least 18 points in order to demonstrate clinically significant reduction in disability    Baseline 12/29: 30; 10/14/2020 62, indicating severe handicap; 10/28/2020 74' 5/23: 72 indicating severe handicap   Not reassessed, but reports no significant change/improvement with dizziness   Time 12    Period Weeks    Status On-going    Target Date  04/18/21      PT LONG TERM GOAL #4   Title Patient will increase 10 meter walk test to >1.28m/s as to improve gait speed for better community ambulation and to reduce fall risk.    Baseline 12/29: 0.37 m/s; 0.38 m/s; 2/10/08/2020 0.31 m/s 11/18/2020 0.34 m/s with SPC; 0.57m/s 12/30/20; 5/23: 0.45 m/s with SPC    Time 12    Period Weeks    Status On-going    Target Date 04/18/21      PT LONG TERM GOAL #5   Title Patient will increase FOTO score to equal to or greater than 64 to demonstrate statistically significant improvement in mobility and quality of life.    Baseline 12/29: 52; 10/14/2020 45; 10/28/2020 45; 11/18/2020 45; 12/30/20 44; 5/23: 51    Time 12    Period Weeks    Status On-going    Target Date 04/18/21      Additional Long Term Goals   Additional Long Term Goals Yes      PT LONG TERM GOAL #6   Title Patient will increase six minute walk test distance to >1000 for progression to community ambulator and improve gait ability    Baseline 6/6: 788 ft with [facility]    Time 10    Period Weeks    Target Date 04/18/21                 Plan - 02/08/21 0803    Clinical Impression Statement FOTO reviewed for progress note, where pt scored 45, indicating impaired percieved functional mobility and QOL. Other goals recently retested/reviewed 01/24/2021, please refer to ntoe. As pt would like to improve her ability to ambulate longer distances with improved balance, 6MWT performed this date, where pt ambulated 788 ft with SPC (below age norms). Pt balance challenged with normalizing step-length and with cone taps/SLB tasks.  Patient's condition has the potential to improve in response to therapy. Maximum improvement is yet to be obtained. The anticipated improvement is attainable and reasonable in a generally predictable time. Pt will benefit from further skilled PT to improve gait ability, balance and strength to improve functional mobility and QOL.    Personal Factors and Comorbidities  Comorbidity 3+;Time since onset of injury/illness/exacerbation    Comorbidities HYPOtension, paraparesis, SCI without spinal bone injury, DDD, cervical cancer, acute kidney injury, GERD, asthma, bradycardia, L total hip replacement, hypokalemia    Examination-Activity Limitations Squat;Lift;Stairs;Bend;Stand;Reach Overhead;Carry;Transfers    Examination-Participation Restrictions Cleaning;Laundry;Meal Prep;Community Activity;Driving;Occupation    Stability/Clinical Decision Making Evolving/Moderate complexity    Rehab Potential Fair    PT Frequency 3x / week    PT Duration 12 weeks    PT Treatment/Interventions ADLs/Self Care Home Management;Aquatic Therapy;Gait training;Stair training;Functional mobility training;Therapeutic activities;Therapeutic exercise;Balance training;Moist Heat;Manual techniques;Patient/family education;Neuromuscular re-education;Passive range of motion;Energy conservation;Vestibular;Joint Manipulations;Spinal Manipulations    PT Next Visit Plan endurance, dynamic and static balance,endurance    PT Home Exercise Plan PROGRESS note: add new HEP, initiate UBE bike for cardiovasc endurance, R ankle motor control/strength, focus on STS and ambulation; added 11/18/2020 (handout) hip adduction, thoracic lumbar extension and sit<>stand with counter support; no changes    Consulted and Agree with Plan of Care Patient           Patient will benefit from skilled therapeutic intervention in order to improve the following deficits and impairments:  Abnormal gait,Dizziness,Improper body mechanics,Decreased mobility,Cardiopulmonary status limiting activity,Decreased activity tolerance,Decreased endurance,Decreased range of motion,Decreased strength,Decreased balance,Decreased safety awareness,Difficulty walking,Impaired flexibility,Obesity  Visit Diagnosis: Unsteadiness on feet  Other abnormalities of gait and mobility  Muscle weakness (generalized)     Problem List Patient  Active Problem List   Diagnosis Date Noted  . Nerve pain 10/18/2020  . Incomplete paraplegia ([place]) 10/18/2020  . Orthostatic hypotension 10/18/2020  . Chronic migraine without aura without status migrainosus, not intractable   . Hypokalemia   . Adjustment reaction with anxiety and depression   . Spinal cord injury, lumbar, without spinal bone injury, sequela ([place]) 08/18/2020  . Paraparesis ([place])   . Post-operative pain   . Hypotension   . Slow transit constipation   . AKI (acute kidney injury) ([place])   . Right leg weakness 08/11/2020  . DDD (degenerative disc disease), lumbar 09/02/2019    Class: Chronic  . Degenerative disc disease, lumbar 09/02/2019  . Chest tightness 09/23/2018  . Bradycardia 09/23/2018  . Labile blood pressure 03/08/2018  . Chronic low back pain 09/03/2017  . History of total hip replacement, left 01/26/2016  . EDEMA 04/28/2008  . LEG PAIN, BILATERAL 12/16/2007  . CERVICAL CANCER 07/02/2007  . MORBID OBESITY 07/02/2007  . DEPRESSION 07/02/2007  . COMMON MIGRAINE 07/02/2007  . Allergic rhinitis 07/02/2007  . ASTHMA 07/02/2007  . GERD 07/02/2007  . ELEVATED BLOOD PRESSURE WITHOUT DIAGNOSIS OF HYPERTENSION 07/02/2007   Ricard Dillon PT, DPT 02/08/2021, 8:35 AM  [place] [facility] [place] SERVICES [address] [place], [place], [address] Phone: [phone]   Fax:  [phone]  Name: AARICA WAX MRN: [id number] Date of Birth: Nov 27, 1970

## 2021-02-09 ENCOUNTER — Other Ambulatory Visit: Payer: Self-pay

## 2021-02-09 ENCOUNTER — Ambulatory Visit: Payer: 59

## 2021-02-09 DIAGNOSIS — M6281 Muscle weakness (generalized): Secondary | ICD-10-CM

## 2021-02-09 DIAGNOSIS — R2681 Unsteadiness on feet: Secondary | ICD-10-CM

## 2021-02-09 DIAGNOSIS — R2689 Other abnormalities of gait and mobility: Secondary | ICD-10-CM

## 2021-02-09 DIAGNOSIS — G8929 Other chronic pain: Secondary | ICD-10-CM

## 2021-02-09 DIAGNOSIS — R42 Dizziness and giddiness: Secondary | ICD-10-CM

## 2021-02-09 DIAGNOSIS — M5441 Lumbago with sciatica, right side: Secondary | ICD-10-CM

## 2021-02-09 DIAGNOSIS — M545 Low back pain, unspecified: Secondary | ICD-10-CM

## 2021-02-09 DIAGNOSIS — R278 Other lack of coordination: Secondary | ICD-10-CM

## 2021-02-09 NOTE — Therapy (Signed)
[place] MAIN [facility] SERVICES [address] [place], [place], [address] Phone: [phone]   Fax:  [phone]  Physical Therapy Treatment  Patient Details  Name: Vanessa Medina MRN: [id number] Date of Birth: [birth year] Referring Provider (PT): Dr. Erling Cruz   Encounter Date: 02/09/2021   PT End of Session - 02/09/21 1528    Visit Number 41    Number of Visits 49    Date for PT Re-Evaluation 04/18/21    Authorization Type [employer] Other    Authorization Time Period 01/24/21-04/18/2021    PT Start Time 1520    PT Stop Time 1600    PT Time Calculation (min) 40 min    Equipment Utilized During Treatment Gait belt    Activity Tolerance Patient tolerated treatment well    Behavior During Therapy [facility] for tasks assessed/performed           Past Medical History:  Diagnosis Date  . Anemia   . Cervical cancer ([place])   . Family history of adverse reaction to anesthesia     " my mother takes a long time time wake up"  . GERD (gastroesophageal reflux disease)   . Headache    migraine  . History of shingles   . Hypertension   . Migraine   . Multiple allergies   . Obesity   . Osteoarthritis    left hip  . PONV (postoperative nausea and vomiting)   . Sleep apnea    does not wear CPAP  . Wears glasses     Past Surgical History:  Procedure Laterality Date  . ABDOMINAL HYSTERECTOMY    . APPENDECTOMY    . BILIOPANCREATIC DIVERSION     with duodenal switch laparoscopic   . CHOLECYSTECTOMY    . DIAGNOSTIC LAPAROSCOPY     LOA  . ESOPHAGOGASTRODUODENOSCOPY (EGD) WITH PROPOFOL N/A 07/25/2017   Procedure: ESOPHAGOGASTRODUODENOSCOPY (EGD) WITH PROPOFOL;  Surgeon: Manya Silvas, MD;  Location: [facility] ENDOSCOPY;  Service: Endoscopy;  Laterality: N/A;  . KNEE ARTHROSCOPY    . LAPAROSCOPIC GASTRIC SLEEVE RESECTION WITH HIATAL HERNIA REPAIR    . TONSILLECTOMY    . TOTAL HIP ARTHROPLASTY Left 01/26/2016   Procedure: LEFT TOTAL HIP ARTHROPLASTY ANTERIOR  APPROACH;  Surgeon: Leandrew Koyanagi, MD;  Location: [place];  Service: Orthopedics;  Laterality: Left;  . TRANSFORAMINAL LUMBAR INTERBODY FUSION (TLIF) WITH PEDICLE SCREW FIXATION 3 LEVEL  08/2019   Basil Dess, MD L2-L5    There were no vitals filed for this visit.   Subjective Assessment - 02/09/21 1522    Subjective Pt doing ok today, low back pain 5/10, foot pain 5/10. Dizziness somewhat improved but continues to have right ear flutters.    Pertinent History 50 year old female with history of HTN, migraines, morbid obesity--BMI-41, chronic hypotension, multiple back surgeries with chronic pain with LLE lumbar radiculopathy who was scheduled to have spinal cord stimulator placed by Dr. Davy Pique on 08/11/2020 but unable to perform surgery due to scar tissue.  History taken from patient and chart review.  Post op on awakening, she had excruciating pain RLE with plegia and and allodynia. She was admitted for work up by Dr. Ronnald Ramp from surgical center on 08/11/2020. Thoracic MRI done revealing subtle increased T2/STIR intensity distal cord at T12-L1 level suspicious for edema/acute spinal cord injury, no CSF leak as well as prior PLIF L2-L5 with residual foraminal protrusion L3/5 potentially irritating right L3 nerve. She was started on IV Dilaudid, gabapentin as well as IV  Decadron for management of pain, headaches and severe neuropathy.  She continues to have pain in RLE but is having some motor return, has constant headache, decreased hearing in right> left ear, constipation with nausea as well as epigastric pain and weakness. Therapy ongoing and CIR recommended due to functional decline.  Of note, she reports 51 lbs weigh loss in the past 3-4 months--due to issues with N/V/intake. Has had two falls last month---no recall of incident leading to fall question due to syncope. Was in process of work up for renal disease. She has had issue with LLE weakness with pain for the past year and being followed by Dr. Ernestina Patches.  Did well with stimulator trial.    Currently in Pain? Yes   5/10 bakc and foot          INTERVENTION THIS DATE: -AMB interval training 2x33ft (CW, CCW) *break between efforts, AMB (3:35"; 3:37")  -SPC RUE, decreased stance time on RLE, reduced knee flexion angle LLE   -standing Marching 1x10 bilat, 2.5AW  -standing hip ABDCT 2.5lb AW 1x10  -standing heel raises 1x20 (2.5lb AW bilat)  -standing (heels on 2x4) toe raises 1x15   -standing Marching 1x10 bilat, 2.5AW  -standing hip ABDCT 2.5lb AW 1x10  -standing heel raises 1x20 (2.5lb AW bilat)  -standing (heels on 2x4) toe raises 1x15    PT Short Term Goals - 02/08/21 0831      PT SHORT TERM GOAL #1   Title Pt will be independent with HEP in order to improve strength and balance in order to decrease fall risk and improve function at home and work.    Baseline 10/14/2020 on-going/deferred; 10/28/2020 ongoing pt has resumed HEP, reports doing what she can/HEP to be advanced; 11/18/2020  Pt has had trouble doing HEP d/t fatigue.; 5/23: Pt reports independence    Time 6    Period Weeks    Status Achieved    Target Date 01/24/21             PT Long Term Goals - 02/08/21 0831      PT LONG TERM GOAL #1   Title Patient (< 91 years old) will complete five times sit to stand test in < 10 seconds indicating an increased LE strength and improved balance.    Baseline 12/29: 38.45s; 10/14/2020 42.8 seconds; 10/28/2020 36 seconds; 11/18/2020 34.84 seconds; 12/30/20 41.55sec; 5/23: 27.2 sec    Time 12    Period Weeks    Status On-going    Target Date 04/18/21      PT LONG TERM GOAL #2   Title Pt will decrease TUG to below 14 seconds/decrease in order to demonstrate decreased fall risk.    Baseline 12/29: 32.94s; 10/14/2020 43.12 sec; 10/28/2020 34.12 seconds; 11/18/2020 26.5 sec; 12/30/20: 31sec c SPC; 5/23: 24.6 with SPC    Time 12    Period Weeks    Status On-going    Target Date 04/18/21      PT LONG TERM GOAL #3   Title Pt will  decrease DHI score by at least 18 points in order to demonstrate clinically significant reduction in disability    Baseline 12/29: 30; 10/14/2020 62, indicating severe handicap; 10/28/2020 74' 5/23: 72 indicating severe handicap   Not reassessed, but reports no significant change/improvement with dizziness   Time 12    Period Weeks    Status On-going    Target Date 04/18/21      PT LONG TERM GOAL #4   Title Patient will  increase 10 meter walk test to >1.61m/s as to improve gait speed for better community ambulation and to reduce fall risk.    Baseline 12/29: 0.37 m/s; 0.38 m/s; 2/10/08/2020 0.31 m/s 11/18/2020 0.34 m/s with SPC; 0.38m/s 12/30/20; 5/23: 0.45 m/s with SPC    Time 12    Period Weeks    Status On-going    Target Date 04/18/21      PT LONG TERM GOAL #5   Title Patient will increase FOTO score to equal to or greater than 64 to demonstrate statistically significant improvement in mobility and quality of life.    Baseline 12/29: 52; 10/14/2020 45; 10/28/2020 45; 11/18/2020 45; 12/30/20 44; 5/23: 51    Time 12    Period Weeks    Status On-going    Target Date 04/18/21      Additional Long Term Goals   Additional Long Term Goals Yes      PT LONG TERM GOAL #6   Title Patient will increase six minute walk test distance to >1000 for progression to community ambulator and improve gait ability    Baseline 6/6: 788 ft with [facility]    Time 10    Period Weeks    Target Date 04/18/21                 Plan - 02/09/21 1529    Clinical Impression Statement Continued with current plan of care as laid out in evaluation and recent prior sessions. Pt remains motivated to advance progress toward goals. Rest breaks provided as needed, pt quick to ask when needed. Pt does require varying levels of assistance and cuing for completion of exercises for correct form and sometimes due to pain/weakness. Pt closely monitored throughout session for safe vitals response and to maximize patient safety during  interventions. Pt continues to demonstrate progress toward goals AEB progression of some interventions this date either in volume or intensity.   Personal Factors and Comorbidities Comorbidity 1    Comorbidities HYPOtension, paraparesis, SCI without spinal bone injury, DDD, cervical cancer, acute kidney injury, GERD, asthma, bradycardia, L total hip replacement, hypokalemia    Examination-Activity Limitations Squat;Lift;Stairs;Bend;Stand;Reach Overhead;Carry;Transfers    Examination-Participation Restrictions Cleaning;Laundry;Meal Prep;Community Activity;Driving;Occupation    Stability/Clinical Decision Making Evolving/Moderate complexity    Clinical Decision Making Moderate    Rehab Potential Fair    PT Frequency 3x / week    PT Duration 12 weeks    PT Treatment/Interventions ADLs/Self Care Home Management;Aquatic Therapy;Gait training;Stair training;Functional mobility training;Therapeutic activities;Therapeutic exercise;Balance training;Moist Heat;Manual techniques;Patient/family education;Neuromuscular re-education;Passive range of motion;Energy conservation;Vestibular;Joint Manipulations;Spinal Manipulations    PT Next Visit Plan endurance, dynamic and static balance,endurance    PT Home Exercise Plan PROGRESS note: add new HEP, initiate UBE bike for cardiovasc endurance, R ankle motor control/strength, focus on STS and ambulation; added 11/18/2020 (handout) hip adduction, thoracic lumbar extension and sit<>stand with counter support; no changes    Consulted and Agree with Plan of Care Patient           Patient will benefit from skilled therapeutic intervention in order to improve the following deficits and impairments:  Abnormal gait,Dizziness,Improper body mechanics,Decreased mobility,Cardiopulmonary status limiting activity,Decreased activity tolerance,Decreased endurance,Decreased range of motion,Decreased strength,Decreased balance,Decreased safety awareness,Difficulty walking,Impaired  flexibility,Obesity  Visit Diagnosis: Unsteadiness on feet  Other abnormalities of gait and mobility  Muscle weakness (generalized)  Dizziness and giddiness  Other lack of coordination  Acute bilateral low back pain with right-sided sciatica  Chronic midline low back pain, unspecified whether sciatica present  Problem List Patient Active Problem List   Diagnosis Date Noted  . Nerve pain 10/18/2020  . Incomplete paraplegia ([place]) 10/18/2020  . Orthostatic hypotension 10/18/2020  . Chronic migraine without aura without status migrainosus, not intractable   . Hypokalemia   . Adjustment reaction with anxiety and depression   . Spinal cord injury, lumbar, without spinal bone injury, sequela ([place]) 08/18/2020  . Paraparesis ([place])   . Post-operative pain   . Hypotension   . Slow transit constipation   . AKI (acute kidney injury) ([place])   . Right leg weakness 08/11/2020  . DDD (degenerative disc disease), lumbar 09/02/2019    Class: Chronic  . Degenerative disc disease, lumbar 09/02/2019  . Chest tightness 09/23/2018  . Bradycardia 09/23/2018  . Labile blood pressure 03/08/2018  . Chronic low back pain 09/03/2017  . History of total hip replacement, left 01/26/2016  . EDEMA 04/28/2008  . LEG PAIN, BILATERAL 12/16/2007  . CERVICAL CANCER 07/02/2007  . MORBID OBESITY 07/02/2007  . DEPRESSION 07/02/2007  . COMMON MIGRAINE 07/02/2007  . Allergic rhinitis 07/02/2007  . ASTHMA 07/02/2007  . GERD 07/02/2007  . ELEVATED BLOOD PRESSURE WITHOUT DIAGNOSIS OF HYPERTENSION 07/02/2007   3:46 PM, 02/09/21 Etta Grandchild, PT, DPT Physical Therapist - [facility]  [phone] ([address])    [place] 02/09/2021, 3:33 PM  [place] MAIN [facility] SERVICES [address] [place], [place], [address] Phone: [phone]   Fax:  [phone]  Name: ANGELLA MONTAS MRN: [id number] Date of Birth:  08-Oct-1970

## 2021-02-14 ENCOUNTER — Other Ambulatory Visit: Payer: Self-pay

## 2021-02-14 ENCOUNTER — Ambulatory Visit: Payer: 59

## 2021-02-21 ENCOUNTER — Other Ambulatory Visit: Payer: Self-pay

## 2021-02-21 ENCOUNTER — Ambulatory Visit: Payer: 59

## 2021-02-21 DIAGNOSIS — R2689 Other abnormalities of gait and mobility: Secondary | ICD-10-CM

## 2021-02-21 DIAGNOSIS — R2681 Unsteadiness on feet: Secondary | ICD-10-CM

## 2021-02-21 DIAGNOSIS — M6281 Muscle weakness (generalized): Secondary | ICD-10-CM

## 2021-02-21 NOTE — Therapy (Signed)
[place] MAIN [facility] [place] SERVICES [place], [place], [address] Phone: [phone]   Fax:  [phone]  Physical Therapy Treatment  Patient Details  Name: Vanessa Medina MRN: [id number] Date of Birth: February 17, 1971 Referring Provider (PT): Dr. Erling Cruz   Encounter Date: 02/21/2021   PT End of Session - 02/21/21 1642     Visit Number 42    Number of Visits 21    Date for PT Re-Evaluation 04/18/21    Authorization Type [employer] Other    Authorization Time Period 01/24/21-04/18/2021    PT Start Time 1644    PT Stop Time 1728    PT Time Calculation (min) 44 min    Equipment Utilized During Treatment Gait belt    Activity Tolerance Patient tolerated treatment well    Behavior During Therapy WFL for tasks assessed/performed             Past Medical History:  Diagnosis Date   Anemia    Cervical cancer ([place])    Family history of adverse reaction to anesthesia     " my mother takes a long time time wake up"   GERD (gastroesophageal reflux disease)    Headache    migraine   History of shingles    Hypertension    Migraine    Multiple allergies    Obesity    Osteoarthritis    left hip   PONV (postoperative nausea and vomiting)    Sleep apnea    does not wear CPAP   Wears glasses     Past Surgical History:  Procedure Laterality Date   ABDOMINAL HYSTERECTOMY     APPENDECTOMY     BILIOPANCREATIC DIVERSION     with duodenal switch laparoscopic    CHOLECYSTECTOMY     DIAGNOSTIC LAPAROSCOPY     LOA   ESOPHAGOGASTRODUODENOSCOPY (EGD) WITH PROPOFOL N/A 07/25/2017   Procedure: ESOPHAGOGASTRODUODENOSCOPY (EGD) WITH PROPOFOL;  Surgeon: Manya Silvas, MD;  Location: [facility] ENDOSCOPY;  Service: Endoscopy;  Laterality: N/A;   KNEE ARTHROSCOPY     LAPAROSCOPIC GASTRIC SLEEVE RESECTION WITH HIATAL HERNIA REPAIR     TONSILLECTOMY     TOTAL HIP ARTHROPLASTY Left 01/26/2016   Procedure: LEFT TOTAL HIP ARTHROPLASTY ANTERIOR APPROACH;   Surgeon: Leandrew Koyanagi, MD;  Location: [facility];  Service: Orthopedics;  Laterality: Left;   TRANSFORAMINAL LUMBAR INTERBODY FUSION (TLIF) WITH PEDICLE SCREW FIXATION 3 LEVEL  08/2019   Basil Dess, MD L2-L5    There were no vitals filed for this visit.   Subjective Assessment - 02/21/21 1654     Subjective Patient reports she is feeling tired from the time. Reports doing ok today, has been working from home.    Pertinent History 50 year old female with history of HTN, migraines, morbid obesity--BMI-41, chronic hypotension, multiple back surgeries with chronic pain with LLE lumbar radiculopathy who was scheduled to have spinal cord stimulator placed by Dr. Davy Pique on 08/11/2020 but unable to perform surgery due to scar tissue.  History taken from patient and chart review.  Post op on awakening, she had excruciating pain RLE with plegia and and allodynia. She was admitted for work up by Dr. Ronnald Ramp from surgical center on 08/11/2020. Thoracic MRI done revealing subtle increased T2/STIR intensity distal cord at T12-L1 level suspicious for edema/acute spinal cord injury, no CSF leak as well as prior PLIF L2-L5 with residual foraminal protrusion L3/5 potentially irritating right L3 nerve. She was started on IV Dilaudid, gabapentin as well as  IV Decadron for management of pain, headaches and severe neuropathy.  She continues to have pain in RLE but is having some motor return, has constant headache, decreased hearing in right> left ear, constipation with nausea as well as epigastric pain and weakness. Therapy ongoing and CIR recommended due to functional decline.  Of note, she reports 50 lbs weigh loss in the past 3-4 months--due to issues with N/V/intake. Has had two falls last month---no recall of incident leading to fall question due to syncope. Was in process of work up for renal disease. She has had issue with LLE weakness with pain for the past year and being followed by Dr. Ernestina Patches. Did well with stimulator  trial.    Currently in Pain? Yes    Pain Score 7     Pain Location Back    Pain Orientation Lower    Pain Descriptors / Indicators Aching    Pain Type Chronic pain                TREATMENT   Therex:   6" step lateral toe taps 12x each LE, intermittent UE support Multicone gait speed: red to orange slow exaggerated ambulation, orange to yellow normal gait speed, yellow to green fast gait speed x 10 trials      Neuro Re-ed: CGA provided throughout   In // bars: Semi-tandem walking forward/backward -  X 6 reps; forwards no UE support, backwards finger tip support.   Toy soldier marching 2x length of // bars; UE support  Cone Heel taps ( 3 cones) - x multiple reps each LE, intermittent UE support; finger tip support  Airex pad: reach for basketball and throw into hoop x 18 ball for pertubation's  Tandem stance with horizontal head turns - 2x10 reps looking R and L. Intermittent UE support.    KoreBalance for weight shift and pertubations with visual and tactile shifts x 6 minutes   Education provided with VC/TC and demonstration for movement at target joints and to facilitate correct muscle activation with all exercises. Pt exhibits good carryover of technique within session.       Patient presents to physical therapy after week absence due to therapist being out sick. Patient is highly motivated to perform therapy tasks, requiring occasional rest breaks due to fatigue. Intermittent UE support required for single limb tasks however patient's ankle righting reactions are improving. Gait speed changes are challenging for patient to perform fast ambulation in particular. Pt will benefit from further skilled PT to improve gait ability, balance and strength to improve functional mobility and QOL                PT Education - 02/21/21 1643     Education Details exercise technique, body mechanics    Person(s) Educated Patient    Methods  Explanation;Demonstration;Tactile cues;Verbal cues    Comprehension Verbalized understanding;Returned demonstration;Verbal cues required;Tactile cues required              PT Short Term Goals - 02/08/21 0831       PT SHORT TERM GOAL #1   Title Pt will be independent with HEP in order to improve strength and balance in order to decrease fall risk and improve function at home and work.    Baseline 10/14/2020 on-going/deferred; 10/28/2020 ongoing pt has resumed HEP, reports doing what she can/HEP to be advanced; 11/18/2020  Pt has had trouble doing HEP d/t fatigue.; 5/23: Pt reports independence    Time 6    Period Weeks  Status Achieved    Target Date 01/24/21               PT Long Term Goals - 02/08/21 0831       PT LONG TERM GOAL #1   Title Patient (< 31 years old) will complete five times sit to stand test in < 10 seconds indicating an increased LE strength and improved balance.    Baseline 12/29: 38.45s; 10/14/2020 42.8 seconds; 10/28/2020 36 seconds; 11/18/2020 34.84 seconds; 12/30/20 41.55sec; 5/23: 27.2 sec    Time 12    Period Weeks    Status On-going    Target Date 04/18/21      PT LONG TERM GOAL #2   Title Pt will decrease TUG to below 14 seconds/decrease in order to demonstrate decreased fall risk.    Baseline 12/29: 32.94s; 10/14/2020 43.12 sec; 10/28/2020 34.12 seconds; 11/18/2020 26.5 sec; 12/30/20: 31sec c SPC; 5/23: 24.6 with SPC    Time 12    Period Weeks    Status On-going    Target Date 04/18/21      PT LONG TERM GOAL #3   Title Pt will decrease DHI score by at least 18 points in order to demonstrate clinically significant reduction in disability    Baseline 12/29: 30; 10/14/2020 62, indicating severe handicap; 10/28/2020 74' 5/23: 72 indicating severe handicap   Not reassessed, but reports no significant change/improvement with dizziness   Time 12    Period Weeks    Status On-going    Target Date 04/18/21      PT LONG TERM GOAL #4   Title Patient will  increase 10 meter walk test to >1.19m/s as to improve gait speed for better community ambulation and to reduce fall risk.    Baseline 12/29: 0.37 m/s; 0.38 m/s; 2/10/08/2020 0.31 m/s 11/18/2020 0.34 m/s with SPC; 0.75m/s 12/30/20; 5/23: 0.45 m/s with SPC    Time 12    Period Weeks    Status On-going    Target Date 04/18/21      PT LONG TERM GOAL #5   Title Patient will increase FOTO score to equal to or greater than 64 to demonstrate statistically significant improvement in mobility and quality of life.    Baseline 12/29: 52; 10/14/2020 45; 10/28/2020 45; 11/18/2020 45; 12/30/20 44; 5/23: 51    Time 12    Period Weeks    Status On-going    Target Date 04/18/21      Additional Long Term Goals   Additional Long Term Goals Yes      PT LONG TERM GOAL #6   Title Patient will increase six minute walk test distance to >1000 for progression to community ambulator and improve gait ability    Baseline 6/6: 788 ft with [facility]    Time 10    Period Weeks    Target Date 04/18/21                   Plan - 02/21/21 1739     Clinical Impression Statement Patient presents to physical therapy after week absence due to therapist being out sick. Patient is highly motivated to perform therapy tasks, requiring occasional rest breaks due to fatigue. Intermittent UE support required for single limb tasks however patient's ankle righting reactions are improving. Gait speed changes are challenging for patient to perform fast ambulation in particular. Pt will benefit from further skilled PT to improve gait ability, balance and strength to improve functional mobility and QOL    Personal  Factors and Comorbidities Comorbidity 3+;Time since onset of injury/illness/exacerbation    Comorbidities HYPOtension, paraparesis, SCI without spinal bone injury, DDD, cervical cancer, acute kidney injury, GERD, asthma, bradycardia, L total hip replacement, hypokalemia    Examination-Activity Limitations  Squat;Lift;Stairs;Bend;Stand;Reach Overhead;Carry;Transfers    Examination-Participation Restrictions Cleaning;Laundry;Meal Prep;Community Activity;Driving;Occupation    Stability/Clinical Decision Making Evolving/Moderate complexity    Rehab Potential Fair    PT Frequency 3x / week    PT Duration 12 weeks    PT Treatment/Interventions ADLs/Self Care Home Management;Aquatic Therapy;Gait training;Stair training;Functional mobility training;Therapeutic activities;Therapeutic exercise;Balance training;Moist Heat;Manual techniques;Patient/family education;Neuromuscular re-education;Passive range of motion;Energy conservation;Vestibular;Joint Manipulations;Spinal Manipulations    PT Next Visit Plan endurance, dynamic and static balance,endurance    PT Home Exercise Plan PROGRESS note: add new HEP, initiate UBE bike for cardiovasc endurance, R ankle motor control/strength, focus on STS and ambulation; added 11/18/2020 (handout) hip adduction, thoracic lumbar extension and sit<>stand with counter support; no changes    Consulted and Agree with Plan of Care Patient             Patient will benefit from skilled therapeutic intervention in order to improve the following deficits and impairments:  Abnormal gait, Dizziness, Improper body mechanics, Decreased mobility, Cardiopulmonary status limiting activity, Decreased activity tolerance, Decreased endurance, Decreased range of motion, Decreased strength, Decreased balance, Decreased safety awareness, Difficulty walking, Impaired flexibility, Obesity  Visit Diagnosis: Unsteadiness on feet  Other abnormalities of gait and mobility  Muscle weakness (generalized)     Problem List Patient Active Problem List   Diagnosis Date Noted   Nerve pain 10/18/2020   Incomplete paraplegia (HCC) 10/18/2020   Orthostatic hypotension 10/18/2020   Chronic migraine without aura without status migrainosus, not intractable    Hypokalemia    Adjustment reaction  with anxiety and depression    Spinal cord injury, lumbar, without spinal bone injury, sequela ([place]) 08/18/2020   Paraparesis ([place])    Post-operative pain    Hypotension    Slow transit constipation    AKI (acute kidney injury) ([place])    Right leg weakness 08/11/2020   DDD (degenerative disc disease), lumbar 09/02/2019    Class: Chronic   Degenerative disc disease, lumbar 09/02/2019   Chest tightness 09/23/2018   Bradycardia 09/23/2018   Labile blood pressure 03/08/2018   Chronic low back pain 09/03/2017   History of total hip replacement, left 01/26/2016   EDEMA 04/28/2008   LEG PAIN, BILATERAL 12/16/2007   CERVICAL CANCER 07/02/2007   MORBID OBESITY 07/02/2007   DEPRESSION 07/02/2007   COMMON MIGRAINE 07/02/2007   Allergic rhinitis 07/02/2007   ASTHMA 07/02/2007   GERD 07/02/2007   ELEVATED BLOOD PRESSURE WITHOUT DIAGNOSIS OF HYPERTENSION 07/02/2007    Janna Arch, PT, DPT  02/21/2021, 5:40 PM  [place] [facility] MAIN [facility] SERVICES [address] [place], [place], [address] Phone: [phone]   Fax:  [phone]  Name: Vanessa Medina MRN: [id number] Date of Birth: Aug 07, 1971

## 2021-02-23 ENCOUNTER — Ambulatory Visit: Payer: 59

## 2021-02-23 ENCOUNTER — Other Ambulatory Visit: Payer: Self-pay

## 2021-02-23 DIAGNOSIS — R2689 Other abnormalities of gait and mobility: Secondary | ICD-10-CM | POA: Diagnosis not present

## 2021-02-23 DIAGNOSIS — R2681 Unsteadiness on feet: Secondary | ICD-10-CM

## 2021-02-23 DIAGNOSIS — M6281 Muscle weakness (generalized): Secondary | ICD-10-CM

## 2021-02-23 NOTE — Therapy (Signed)
[place] MAIN [facility] SERVICES [place], [place], [address] Phone: [phone]   Fax:  [phone]  Physical Therapy Treatment  Patient Details  Name: Vanessa Medina MRN: [id number] Date of Birth: [birth year] Referring Provider (PT): Dr. Erling Cruz   Encounter Date: 02/23/2021   PT End of Session - 02/23/21 1537     Visit Number 43    Number of Visits 80    Date for PT Re-Evaluation 04/18/21    Authorization Type [employer] Other    Authorization Time Period 01/24/21-04/18/2021    PT Start Time 1443    PT Stop Time 1529    PT Time Calculation (min) 46 min    Equipment Utilized During Treatment Gait belt    Activity Tolerance Patient tolerated treatment well    Behavior During Therapy WFL for tasks assessed/performed             Past Medical History:  Diagnosis Date   Anemia    Cervical cancer ([place])    Family history of adverse reaction to anesthesia     " my mother takes a long time time wake up"   GERD (gastroesophageal reflux disease)    Headache    migraine   History of shingles    Hypertension    Migraine    Multiple allergies    Obesity    Osteoarthritis    left hip   PONV (postoperative nausea and vomiting)    Sleep apnea    does not wear CPAP   Wears glasses     Past Surgical History:  Procedure Laterality Date   ABDOMINAL HYSTERECTOMY     APPENDECTOMY     BILIOPANCREATIC DIVERSION     with duodenal switch laparoscopic    CHOLECYSTECTOMY     DIAGNOSTIC LAPAROSCOPY     LOA   ESOPHAGOGASTRODUODENOSCOPY (EGD) WITH PROPOFOL N/A 07/25/2017   Procedure: ESOPHAGOGASTRODUODENOSCOPY (EGD) WITH PROPOFOL;  Surgeon: Manya Silvas, MD;  Location: [facility] ENDOSCOPY;  Service: Endoscopy;  Laterality: N/A;   KNEE ARTHROSCOPY     LAPAROSCOPIC GASTRIC SLEEVE RESECTION WITH HIATAL HERNIA REPAIR     TONSILLECTOMY     TOTAL HIP ARTHROPLASTY Left 01/26/2016   Procedure: LEFT TOTAL HIP ARTHROPLASTY ANTERIOR APPROACH;   Surgeon: Leandrew Koyanagi, MD;  Location: [place];  Service: Orthopedics;  Laterality: Left;   TRANSFORAMINAL LUMBAR INTERBODY FUSION (TLIF) WITH PEDICLE SCREW FIXATION 3 LEVEL  08/2019   Basil Dess, MD L2-L5    There were no vitals filed for this visit.   Subjective Assessment - 02/23/21 1536     Subjective Patient is wearing wrist brace today. Reports wrist is painful. No falls or LOB since last session.    Pertinent History 50 year old female with history of HTN, migraines, morbid obesity--BMI-41, chronic hypotension, multiple back surgeries with chronic pain with LLE lumbar radiculopathy who was scheduled to have spinal cord stimulator placed by Dr. Davy Pique on 08/11/2020 but unable to perform surgery due to scar tissue.  History taken from patient and chart review.  Post op on awakening, she had excruciating pain RLE with plegia and and allodynia. She was admitted for work up by Dr. Ronnald Ramp from surgical center on 08/11/2020. Thoracic MRI done revealing subtle increased T2/STIR intensity distal cord at T12-L1 level suspicious for edema/acute spinal cord injury, no CSF leak as well as prior PLIF L2-L5 with residual foraminal protrusion L3/5 potentially irritating right L3 nerve. She was started on IV Dilaudid, gabapentin as well as IV  Decadron for management of pain, headaches and severe neuropathy.  She continues to have pain in RLE but is having some motor return, has constant headache, decreased hearing in right> left ear, constipation with nausea as well as epigastric pain and weakness. Therapy ongoing and CIR recommended due to functional decline.  Of note, she reports 50 lbs weigh loss in the past 3-4 months--due to issues with N/V/intake. Has had two falls last month---no recall of incident leading to fall question due to syncope. Was in process of work up for renal disease. She has had issue with LLE weakness with pain for the past year and being followed by Dr. Ernestina Patches. Did well with stimulator trial.     Currently in Pain? Yes    Pain Score 7     Pain Location Wrist    Pain Orientation Right    Pain Descriptors / Indicators Aching    Pain Type Chronic pain    Pain Onset In the past 7 days    Pain Frequency Constant               TREATMENT   Therex:  Sit to stand 10x with SUE support; able to perform with fatigue  Modified forward lunges with BUE support 4x length of // bars Lateral squat stepping 4x length of // bars ; BUE suppport    Neuro Re-ed: CGA provided throughout Red light green light for initiation and termination of gait 4x 60 ft; instability noted with tandem positioning but able to remain in standing position.  In // bars: Karaoke 4 x length of // bars cues for sequencing; BUE support   Airex pad 6' step modified tandem stance 2x 30 seconds each LE placement Airex pad: lateral step up/down finger tip support 12x each LE Tilt board: Anterior/Posterior 12x each LE placement; BUE support cues for controlled movements for reduced speed Tilt board: lateral Left/Right 12x each side; BUE support, good control with smooth coordinated movements.    Education provided with VC/TC and demonstration for movement at target joints and to facilitate correct muscle activation with all exercises. Pt exhibits good carryover of technique within session.                        PT Education - 02/23/21 1537     Education Details exercise technique, body mechanics    Person(s) Educated Patient    Methods Explanation;Demonstration;Tactile cues;Verbal cues    Comprehension Verbalized understanding;Returned demonstration;Verbal cues required;Tactile cues required              PT Short Term Goals - 02/08/21 0831       PT SHORT TERM GOAL #1   Title Pt will be independent with HEP in order to improve strength and balance in order to decrease fall risk and improve function at home and work.    Baseline 10/14/2020 on-going/deferred; 10/28/2020 ongoing pt has  resumed HEP, reports doing what she can/HEP to be advanced; 11/18/2020  Pt has had trouble doing HEP d/t fatigue.; 5/23: Pt reports independence    Time 6    Period Weeks    Status Achieved    Target Date 01/24/21               PT Long Term Goals - 02/08/21 0831       PT LONG TERM GOAL #1   Title Patient (< 74 years old) will complete five times sit to stand test in < 10 seconds indicating an increased  LE strength and improved balance.    Baseline 12/29: 38.45s; 10/14/2020 42.8 seconds; 10/28/2020 36 seconds; 11/18/2020 34.84 seconds; 12/30/20 41.55sec; 5/23: 27.2 sec    Time 12    Period Weeks    Status On-going    Target Date 04/18/21      PT LONG TERM GOAL #2   Title Pt will decrease TUG to below 14 seconds/decrease in order to demonstrate decreased fall risk.    Baseline 12/29: 32.94s; 10/14/2020 43.12 sec; 10/28/2020 34.12 seconds; 11/18/2020 26.5 sec; 12/30/20: 31sec c SPC; 5/23: 24.6 with SPC    Time 12    Period Weeks    Status On-going    Target Date 04/18/21      PT LONG TERM GOAL #3   Title Pt will decrease DHI score by at least 18 points in order to demonstrate clinically significant reduction in disability    Baseline 12/29: 30; 10/14/2020 62, indicating severe handicap; 10/28/2020 74' 5/23: 72 indicating severe handicap   Not reassessed, but reports no significant change/improvement with dizziness   Time 12    Period Weeks    Status On-going    Target Date 04/18/21      PT LONG TERM GOAL #4   Title Patient will increase 10 meter walk test to >1.24m/s as to improve gait speed for better community ambulation and to reduce fall risk.    Baseline 12/29: 0.37 m/s; 0.38 m/s; 2/10/08/2020 0.31 m/s 11/18/2020 0.34 m/s with SPC; 0.76m/s 12/30/20; 5/23: 0.45 m/s with SPC    Time 12    Period Weeks    Status On-going    Target Date 04/18/21      PT LONG TERM GOAL #5   Title Patient will increase FOTO score to equal to or greater than 64 to demonstrate statistically significant  improvement in mobility and quality of life.    Baseline 12/29: 52; 10/14/2020 45; 10/28/2020 45; 11/18/2020 45; 12/30/20 44; 5/23: 51    Time 12    Period Weeks    Status On-going    Target Date 04/18/21      Additional Long Term Goals   Additional Long Term Goals Yes      PT LONG TERM GOAL #6   Title Patient will increase six minute walk test distance to >1000 for progression to community ambulator and improve gait ability    Baseline 6/6: 788 ft with [facility]    Time 10    Period Weeks    Target Date 04/18/21                   Plan - 02/23/21 1538     Clinical Impression Statement Patient is highly motivated throughout physical therapy session. She tolerates all standing interventions well with no reported pain increase. Ankle righting reactions noted on airex pad with no LOB. She is able to terminate ambulation without LOB but is unsteady indicating area of continued focus. Educated on hand therapist if she desires therapy for painful wrist. Pt will benefit from further skilled PT to improve gait ability, balance and strength to improve functional mobility and QOL    Personal Factors and Comorbidities Comorbidity 3+;Time since onset of injury/illness/exacerbation    Comorbidities HYPOtension, paraparesis, SCI without spinal bone injury, DDD, cervical cancer, acute kidney injury, GERD, asthma, bradycardia, L total hip replacement, hypokalemia    Examination-Activity Limitations Squat;Lift;Stairs;Bend;Stand;Reach Overhead;Carry;Transfers    Examination-Participation Restrictions Cleaning;Laundry;Meal Prep;Community Activity;Driving;Occupation    Stability/Clinical Decision Making Evolving/Moderate complexity    Rehab Potential Fair  PT Frequency 3x / week    PT Duration 12 weeks    PT Treatment/Interventions ADLs/Self Care Home Management;Aquatic Therapy;Gait training;Stair training;Functional mobility training;Therapeutic activities;Therapeutic exercise;Balance training;Moist  Heat;Manual techniques;Patient/family education;Neuromuscular re-education;Passive range of motion;Energy conservation;Vestibular;Joint Manipulations;Spinal Manipulations    PT Next Visit Plan endurance, dynamic and static balance,endurance    PT Home Exercise Plan PROGRESS note: add new HEP, initiate UBE bike for cardiovasc endurance, R ankle motor control/strength, focus on STS and ambulation; added 11/18/2020 (handout) hip adduction, thoracic lumbar extension and sit<>stand with counter support; no changes    Consulted and Agree with Plan of Care Patient             Patient will benefit from skilled therapeutic intervention in order to improve the following deficits and impairments:  Abnormal gait, Dizziness, Improper body mechanics, Decreased mobility, Cardiopulmonary status limiting activity, Decreased activity tolerance, Decreased endurance, Decreased range of motion, Decreased strength, Decreased balance, Decreased safety awareness, Difficulty walking, Impaired flexibility, Obesity  Visit Diagnosis: Unsteadiness on feet  Other abnormalities of gait and mobility  Muscle weakness (generalized)     Problem List Patient Active Problem List   Diagnosis Date Noted   Nerve pain 10/18/2020   Incomplete paraplegia (HCC) 10/18/2020   Orthostatic hypotension 10/18/2020   Chronic migraine without aura without status migrainosus, not intractable    Hypokalemia    Adjustment reaction with anxiety and depression    Spinal cord injury, lumbar, without spinal bone injury, sequela ([place]) 08/18/2020   Paraparesis ([place])    Post-operative pain    Hypotension    Slow transit constipation    AKI (acute kidney injury) ([place])    Right leg weakness 08/11/2020   DDD (degenerative disc disease), lumbar 09/02/2019    Class: Chronic   Degenerative disc disease, lumbar 09/02/2019   Chest tightness 09/23/2018   Bradycardia 09/23/2018   Labile blood pressure 03/08/2018   Chronic low back pain  09/03/2017   History of total hip replacement, left 01/26/2016   EDEMA 04/28/2008   LEG PAIN, BILATERAL 12/16/2007   CERVICAL CANCER 07/02/2007   MORBID OBESITY 07/02/2007   DEPRESSION 07/02/2007   COMMON MIGRAINE 07/02/2007   Allergic rhinitis 07/02/2007   ASTHMA 07/02/2007   GERD 07/02/2007   ELEVATED BLOOD PRESSURE WITHOUT DIAGNOSIS OF HYPERTENSION 07/02/2007    Janna Arch, PT, DPT  02/23/2021, 3:39 PM  [facility] MAIN [facility] SERVICES [address] [place], [place], [address] Phone: [phone]   Fax:  [phone]  Name: Vanessa Medina MRN: [id number] Date of Birth: [birth year]

## 2021-02-25 ENCOUNTER — Encounter: Payer: Self-pay | Admitting: Physical Medicine and Rehabilitation

## 2021-02-25 ENCOUNTER — Other Ambulatory Visit: Payer: Self-pay

## 2021-02-25 ENCOUNTER — Encounter: Payer: 59 | Attending: Physical Medicine and Rehabilitation | Admitting: Physical Medicine and Rehabilitation

## 2021-02-25 VITALS — BP 99/66 | HR 72 | Temp 98.2°F | Ht 65.0 in | Wt 253.0 lb

## 2021-02-25 DIAGNOSIS — M792 Neuralgia and neuritis, unspecified: Secondary | ICD-10-CM | POA: Diagnosis not present

## 2021-02-25 DIAGNOSIS — G43709 Chronic migraine without aura, not intractable, without status migrainosus: Secondary | ICD-10-CM | POA: Insufficient documentation

## 2021-02-25 DIAGNOSIS — G8222 Paraplegia, incomplete: Secondary | ICD-10-CM

## 2021-02-25 DIAGNOSIS — M7918 Myalgia, other site: Secondary | ICD-10-CM | POA: Insufficient documentation

## 2021-02-25 DIAGNOSIS — G4701 Insomnia due to medical condition: Secondary | ICD-10-CM | POA: Diagnosis present

## 2021-02-25 NOTE — Progress Notes (Signed)
Pt is a 50 yr old female with paraparesis/incomplete paraplegia due to attempted spinal cord stimulator placement- With chronic hx of orthostasis, BMI of 41 s/p gastric sleeve, Acute on chronic renal issues, here for f/u on SCI and orthostatic hypotension. Vestibular issues due to trigger points and central issues.   Here for f/u on incomplete paraplegia and myofascial pain.  Walking with quad cane now- which is great-  A least a couple months.   Ears are the same- to the point Trigger point injections helped some for 10 days- really well, but then came back with a vengeance.  Gets fluttering in the ears- so bad- sound is like when you drive with 1 car window down and has that awful vibration /flutter.  Some days go into recliner and cannot move.   R side of neck Was trying to align her "crystals"- had really bad nystagmus- R side 10x worse than L side.  Couldn't get it done- too sick.  Too dizzy or nausea- room was spinning too much.  Has an appointment with allergist in early August- was able to finally get an appointment.  Still doing PT 2x/week.   Monday and Wednesdays   Seeing the Neurologist Monday- Dr Louanne Skye wanted her to see Neurologist-  Was trying to get since January- since Dr Louanne Skye waned to her to see- them- because he wasn't sure why she's weak- however she's had a SCI due to failed stimulator placement.  Dizziness/orthostatic hypotension is about the same-  BP 98/66 this AM- HR 72.   Hasn't been sleeping-  Has trazodone, but been taking tylenol pm at night-  When gets up in the AM with trazodone, wants to  sleep all day.  Take an entire 50 mg tablet.   Working some- 16 hours/week still.  Monday/Tuesday and Thursday and Friday 4 hours/day.  Needs a letter for work- to work limited hours.  Is driving. To doctor's appointment occ grocery stores.    Plan: Suggest pretreatment before crystal realignment/otolith movement- take phenergan 1/2 dose and 1/2 dose of  Meclizine BEFORE therapy.  2. I'm not sure why needs to see Neurologist- does have SCI injury due to failed stimulator placement- . I'm hopeful they might have ideas to offer.   3. Con't Midodrine 10 mg TID and Florinef. 0,2 mg daily for orthostatic hypotension.  NO CHANGES!  4.  Can try trazodone 25 mg (1/2 tab)- at night- if doesn't work, can try Benadryl- 25 mg nightly- only lasts 4-6 hours- so shouldn't be groggy in AM- If there's any other prescription medicine, PCP would be helpful for that.   5. Patient here for trigger point injections for neck pain/and vestibular issues  Consent done and on chart.  Cleaned areas with alcohol and injected using a 27 gauge 1.5 inch needle  Injected 4.5 cc Using 1% Lidocaine with no EPI  Upper traps B/L  Levators B/L Posterior scalenes B/L  Middle scalenes B/L  Splenius Capitus B/L Pectoralis Major L only Rhomboids Infraspinatus Teres Major/minor Thoracic paraspinals Lumbar paraspinals Other injections-    Patient's level of pain prior was 9/10 Current level of pain after injections is dropped to 5/10- and R ear feels much better- took "flapping away".   There was no bleeding or complications.  Patient was advised to drink a lot of water on day after injections to flush system Will have increased soreness for 12-48 hours after injections.  Can use Lidocaine patches the day AFTER injections Can use theracane on day of injections  in places didn't inject Can use heating pad 4-6 hours AFTER injections   6. F/U in 6-8 weeks for trigger point injections  I spent a total of 30 minutes on vit- 2 minutes on visit and 10 minutes on injections. As detailed above on treating insomnia and vertigo and orthostasis.

## 2021-02-25 NOTE — Patient Instructions (Signed)
Plan: Suggest pretreatment before crystal realignment/otolith movement- take phenergan 1/2 dose and 1/2 dose of Meclizine BEFORE therapy.  2. I'm not sure why needs to see Neurologist- does have SCI injury due to failed stimulator placement- . I'm hopeful they might have ideas to offer.   3. Con't Midodrine 10 mg TID and Florinef. 0,2 mg daily for orthostatic hypotension.  NO CHANGES!  4.  Can try trazodone 25 mg (1/2 tab)- at night- if doesn't work, can try Benadryl- 25 mg nightly- only lasts 4-6 hours- so shouldn't be groggy in AM- If there's any other prescription medicine, PCP would be helpful for that.   5. Patient here for trigger point injections for neck pain/and vestibular issues  Consent done and on chart.  Cleaned areas with alcohol and injected using a 27 gauge 1.5 inch needle  Injected 4.5 cc Using 1% Lidocaine with no EPI  Upper traps B/L  Levators B/L Posterior scalenes B/L  Middle scalenes B/L  Splenius Capitus B/L Pectoralis Major L only Rhomboids Infraspinatus Teres Major/minor Thoracic paraspinals Lumbar paraspinals Other injections-    Patient's level of pain prior was 9/10 Current level of pain after injections is dropped to 5/10- and R ear feels much better- took "flapping away".   There was no bleeding or complications.  Patient was advised to drink a lot of water on day after injections to flush system Will have increased soreness for 12-48 hours after injections.  Can use Lidocaine patches the day AFTER injections Can use theracane on day of injections in places didn't inject Can use heating pad 4-6 hours AFTER injections   6. F/U in 6-8 weeks for trigger point injections

## 2021-02-27 ENCOUNTER — Other Ambulatory Visit: Payer: Self-pay | Admitting: Physical Medicine and Rehabilitation

## 2021-02-27 DIAGNOSIS — G903 Multi-system degeneration of the autonomic nervous system: Secondary | ICD-10-CM

## 2021-02-28 ENCOUNTER — Ambulatory Visit: Payer: 59

## 2021-02-28 ENCOUNTER — Ambulatory Visit (INDEPENDENT_AMBULATORY_CARE_PROVIDER_SITE_OTHER): Payer: 59 | Admitting: Neurology

## 2021-02-28 ENCOUNTER — Other Ambulatory Visit: Payer: Self-pay

## 2021-02-28 ENCOUNTER — Encounter: Payer: Self-pay | Admitting: Neurology

## 2021-02-28 VITALS — BP 111/71 | HR 61 | Ht 65.0 in | Wt 251.0 lb

## 2021-02-28 DIAGNOSIS — M79671 Pain in right foot: Secondary | ICD-10-CM

## 2021-02-28 DIAGNOSIS — R2681 Unsteadiness on feet: Secondary | ICD-10-CM

## 2021-02-28 DIAGNOSIS — R2689 Other abnormalities of gait and mobility: Secondary | ICD-10-CM | POA: Diagnosis not present

## 2021-02-28 DIAGNOSIS — M5416 Radiculopathy, lumbar region: Secondary | ICD-10-CM | POA: Diagnosis not present

## 2021-02-28 DIAGNOSIS — M6281 Muscle weakness (generalized): Secondary | ICD-10-CM

## 2021-02-28 NOTE — Progress Notes (Signed)
Had seizure with a fall.  Stroke 08/2020 when in surgery for spinal stimulator

## 2021-02-28 NOTE — Therapy (Signed)
[place] MAIN [facility] SERVICES [address] [place], [place], [address] Phone: [phone]   Fax:  [phone]  Physical Therapy Treatment  Patient Details  Name: Vanessa Medina MRN: [id number] Date of Birth: 08/17/1971 Referring Provider (PT): Dr. Erling Cruz   Encounter Date: 02/28/2021   PT End of Session - 02/28/21 1648     Visit Number 80    Number of Visits 12    Date for PT Re-Evaluation 04/18/21    Authorization Type U[employer] Other    Authorization Time Period 01/24/21-04/18/2021    PT Start Time 1600    PT Stop Time 1644    PT Time Calculation (min) 44 min    Equipment Utilized During Treatment Gait belt    Activity Tolerance Patient tolerated treatment well    Behavior During Therapy WFL for tasks assessed/performed             Past Medical History:  Diagnosis Date   Anemia    Cervical cancer ([place])    Family history of adverse reaction to anesthesia     " my mother takes a long time time wake up"   GERD (gastroesophageal reflux disease)    Headache    migraine   History of shingles    Hypertension    Migraine    Multiple allergies    Obesity    Osteoarthritis    left hip   PONV (postoperative nausea and vomiting)    Sleep apnea    does not wear CPAP   Stroke ([place]) 08/11/2020   Wears glasses     Past Surgical History:  Procedure Laterality Date   ABDOMINAL HYSTERECTOMY     APPENDECTOMY     BILIOPANCREATIC DIVERSION     with duodenal switch laparoscopic    CHOLECYSTECTOMY     DIAGNOSTIC LAPAROSCOPY     LOA   ESOPHAGOGASTRODUODENOSCOPY (EGD) WITH PROPOFOL N/A 07/25/2017   Procedure: ESOPHAGOGASTRODUODENOSCOPY (EGD) WITH PROPOFOL;  Surgeon: Manya Silvas, MD;  Location: [facility] ENDOSCOPY;  Service: Endoscopy;  Laterality: N/A;   KNEE ARTHROSCOPY     LAPAROSCOPIC GASTRIC SLEEVE RESECTION WITH HIATAL HERNIA REPAIR     TONSILLECTOMY     TOTAL HIP ARTHROPLASTY Left 01/26/2016   Procedure: LEFT TOTAL HIP ARTHROPLASTY  ANTERIOR APPROACH;  Surgeon: Leandrew Koyanagi, MD;  Location: [place];  Service: Orthopedics;  Laterality: Left;   TRANSFORAMINAL LUMBAR INTERBODY FUSION (TLIF) WITH PEDICLE SCREW FIXATION 3 LEVEL  08/2019   Basil Dess, MD L2-L5    There were no vitals filed for this visit.   Subjective Assessment - 02/28/21 1633     Subjective Patient was stung by a bee over the weekend. Reports her back and right leg that is painful today.    Pertinent History 50 year old female with history of HTN, migraines, morbid obesity--BMI-41, chronic hypotension, multiple back surgeries with chronic pain with LLE lumbar radiculopathy who was scheduled to have spinal cord stimulator placed by Dr. Davy Pique on 08/11/2020 but unable to perform surgery due to scar tissue.  History taken from patient and chart review.  Post op on awakening, she had excruciating pain RLE with plegia and and allodynia. She was admitted for work up by Dr. Ronnald Ramp from surgical center on 08/11/2020. Thoracic MRI done revealing subtle increased T2/STIR intensity distal cord at T12-L1 level suspicious for edema/acute spinal cord injury, no CSF leak as well as prior PLIF L2-L5 with residual foraminal protrusion L3/5 potentially irritating right L3 nerve. She was started on  IV Dilaudid, gabapentin as well as IV Decadron for management of pain, headaches and severe neuropathy.  She continues to have pain in RLE but is having some motor return, has constant headache, decreased hearing in right> left ear, constipation with nausea as well as epigastric pain and weakness. Therapy ongoing and CIR recommended due to functional decline.  Of note, she reports 50 lbs weigh loss in the past 3-4 months--due to issues with N/V/intake. Has had two falls last month---no recall of incident leading to fall question due to syncope. Was in process of work up for renal disease. She has had issue with LLE weakness with pain for the past year and being followed by Dr. Ernestina Patches. Did well with  stimulator trial.    Currently in Pain? Yes    Pain Score 7     Pain Location Back    Pain Orientation Lower    Pain Descriptors / Indicators Aching    Pain Type Chronic pain    Pain Radiating Towards RLE    Pain Onset In the past 7 days    Pain Frequency Constant                 TREATMENT   Therex:  Sit to stand 5x with SUE support; able to perform with fatigue   Seated: RTB around bilateral ankles:  -ER/IR 12x each LE -alternating LAQ 10x each LE    Neuro Re-ed: CGA provided throughout  In // bars: Airex pad 6' step modified tandem stance 2x 30 seconds each LE placement Airex pad: 6" step airex pad sandwhich lateral step up/down finger tip support 10x each direction Airex pad: PVC pipe row 10x, straight arm raise 10x Airex pad 6" step toe taps no UE support 12x each LE Orange hurdle: step over 10x each LE    Education provided with VC/TC and demonstration for movement at target joints and to facilitate correct muscle activation with all exercises. Pt exhibits good carryover of technique within session.       Patient is highly motivated throughout physical therapy session. Her RLE is fatigued quicker than her left requiring intermittent UE support for contraction and coordination. She is getting stronger in general however with improved ankle righting reactions and tolerance of multiple exercises in a row. Pt will benefit from further skilled PT to improve gait ability, balance and strength to improve functional mobility and QOL                   PT Education - 02/28/21 1648     Education Details exercise technique, body mechanics    Person(s) Educated Patient    Methods Explanation;Demonstration;Tactile cues;Verbal cues    Comprehension Verbalized understanding;Returned demonstration;Verbal cues required;Tactile cues required              PT Short Term Goals - 02/08/21 0831       PT SHORT TERM GOAL #1   Title Pt will be independent  with HEP in order to improve strength and balance in order to decrease fall risk and improve function at home and work.    Baseline 10/14/2020 on-going/deferred; 10/28/2020 ongoing pt has resumed HEP, reports doing what she can/HEP to be advanced; 11/18/2020  Pt has had trouble doing HEP d/t fatigue.; 5/23: Pt reports independence    Time 6    Period Weeks    Status Achieved    Target Date 01/24/21               PT Long  Term Goals - 02/08/21 0831       PT LONG TERM GOAL #1   Title Patient (< 34 years old) will complete five times sit to stand test in < 10 seconds indicating an increased LE strength and improved balance.    Baseline 12/29: 38.45s; 10/14/2020 42.8 seconds; 10/28/2020 36 seconds; 11/18/2020 34.84 seconds; 12/30/20 41.55sec; 5/23: 27.2 sec    Time 12    Period Weeks    Status On-going    Target Date 04/18/21      PT LONG TERM GOAL #2   Title Pt will decrease TUG to below 14 seconds/decrease in order to demonstrate decreased fall risk.    Baseline 12/29: 32.94s; 10/14/2020 43.12 sec; 10/28/2020 34.12 seconds; 11/18/2020 26.5 sec; 12/30/20: 31sec c SPC; 5/23: 24.6 with SPC    Time 12    Period Weeks    Status On-going    Target Date 04/18/21      PT LONG TERM GOAL #3   Title Pt will decrease DHI score by at least 18 points in order to demonstrate clinically significant reduction in disability    Baseline 12/29: 30; 10/14/2020 62, indicating severe handicap; 10/28/2020 74' 5/23: 72 indicating severe handicap   Not reassessed, but reports no significant change/improvement with dizziness   Time 12    Period Weeks    Status On-going    Target Date 04/18/21      PT LONG TERM GOAL #4   Title Patient will increase 10 meter walk test to >1.1m/s as to improve gait speed for better community ambulation and to reduce fall risk.    Baseline 12/29: 0.37 m/s; 0.38 m/s; 2/10/08/2020 0.31 m/s 11/18/2020 0.34 m/s with SPC; 0.41m/s 12/30/20; 5/23: 0.45 m/s with SPC    Time 12    Period Weeks     Status On-going    Target Date 04/18/21      PT LONG TERM GOAL #5   Title Patient will increase FOTO score to equal to or greater than 64 to demonstrate statistically significant improvement in mobility and quality of life.    Baseline 12/29: 52; 10/14/2020 45; 10/28/2020 45; 11/18/2020 45; 12/30/20 44; 5/23: 51    Time 12    Period Weeks    Status On-going    Target Date 04/18/21      Additional Long Term Goals   Additional Long Term Goals Yes      PT LONG TERM GOAL #6   Title Patient will increase six minute walk test distance to >1000 for progression to community ambulator and improve gait ability    Baseline 6/6: 788 ft with [facility]    Time 10    Period Weeks    Target Date 04/18/21                   Plan - 02/28/21 1750     Clinical Impression Statement Patient is highly motivated throughout physical therapy session. Her RLE is fatigued quicker than her left requiring intermittent UE support for contraction and coordination. She is getting stronger in general however with improved ankle righting reactions and tolerance of multiple exercises in a row. Pt will benefit from further skilled PT to improve gait ability, balance and strength to improve functional mobility and QOL    Personal Factors and Comorbidities Comorbidity 3+;Time since onset of injury/illness/exacerbation    Comorbidities HYPOtension, paraparesis, SCI without spinal bone injury, DDD, cervical cancer, acute kidney injury, GERD, asthma, bradycardia, L total hip replacement, hypokalemia  Examination-Activity Limitations Squat;Lift;Stairs;Bend;Stand;Reach Overhead;Carry;Transfers    Examination-Participation Restrictions Cleaning;Laundry;Meal Prep;Community Activity;Driving;Occupation    Stability/Clinical Decision Making Evolving/Moderate complexity    Rehab Potential Fair    PT Frequency 3x / week    PT Duration 12 weeks    PT Treatment/Interventions ADLs/Self Care Home Management;Aquatic Therapy;Gait  training;Stair training;Functional mobility training;Therapeutic activities;Therapeutic exercise;Balance training;Moist Heat;Manual techniques;Patient/family education;Neuromuscular re-education;Passive range of motion;Energy conservation;Vestibular;Joint Manipulations;Spinal Manipulations    PT Next Visit Plan endurance, dynamic and static balance,endurance    PT Home Exercise Plan PROGRESS note: add new HEP, initiate UBE bike for cardiovasc endurance, R ankle motor control/strength, focus on STS and ambulation; added 11/18/2020 (handout) hip adduction, thoracic lumbar extension and sit<>stand with counter support; no changes    Consulted and Agree with Plan of Care Patient             Patient will benefit from skilled therapeutic intervention in order to improve the following deficits and impairments:  Abnormal gait, Dizziness, Improper body mechanics, Decreased mobility, Cardiopulmonary status limiting activity, Decreased activity tolerance, Decreased endurance, Decreased range of motion, Decreased strength, Decreased balance, Decreased safety awareness, Difficulty walking, Impaired flexibility, Obesity  Visit Diagnosis: Unsteadiness on feet  Muscle weakness (generalized)  Other abnormalities of gait and mobility     Problem List Patient Active Problem List   Diagnosis Date Noted   Insomnia due to medical condition 02/25/2021   Myofascial muscle pain 02/25/2021   Nerve pain 10/18/2020   Incomplete paraplegia ([facility]) 10/18/2020   Orthostatic hypotension 10/18/2020   Chronic migraine without aura without status migrainosus, not intractable    Hypokalemia    Adjustment reaction with anxiety and depression    Spinal cord injury, lumbar, without spinal bone injury, sequela ([place]) 08/18/2020   Paraparesis ([place])    Post-operative pain    Hypotension    Slow transit constipation    AKI (acute kidney injury) ([place])    Right leg weakness 08/11/2020   DDD (degenerative disc disease),  lumbar 09/02/2019    Class: Chronic   Degenerative disc disease, lumbar 09/02/2019   Chest tightness 09/23/2018   Bradycardia 09/23/2018   Labile blood pressure 03/08/2018   Chronic low back pain 09/03/2017   History of total hip replacement, left 01/26/2016   EDEMA 04/28/2008   LEG PAIN, BILATERAL 12/16/2007   CERVICAL CANCER 07/02/2007   MORBID OBESITY 07/02/2007   DEPRESSION 07/02/2007   COMMON MIGRAINE 07/02/2007   Allergic rhinitis 07/02/2007   ASTHMA 07/02/2007   GERD 07/02/2007   ELEVATED BLOOD PRESSURE WITHOUT DIAGNOSIS OF HYPERTENSION 07/02/2007   Vanessa Medina, PT, DPT  02/28/2021, 5:51 PM  [place] [facility] MAIN [facility] SERVICES [address] [place], [place], [address] Phone: [phone]   Fax:  [phone]  Name: Vanessa Medina MRN: [id number] Date of Birth: 1971/02/09

## 2021-02-28 NOTE — Progress Notes (Signed)
[place] [facility] Clinic Note - Initial Visit   Date: 02/28/21  MARGREE GIMBEL MRN: [id number] DOB: [birth year]   Dear Dr. Louanne Skye:  Thank you for your kind referral of [name] for consultation of right foot pain. Although her history is well known to you, please allow [name] to reiterate it for the purpose of our medical record. The patient was accompanied to the clinic by self.  History of Present Illness: Vanessa Medina is a 50 y.o. right-handed female with hypertension, migraines, s/p lumbar fusion (L2-L5), chronic back pain s/p failed spinal cord stimulator implantation, and orthostasis presenting for evaluation of right foot pain.  In December 2020, she underwent lumbar fusion.  However, she continues to have chronic pain for which she was offered spinal cord stimulator.  She was unable to have the spinal cord stimulator placed due to scar tissue.  Following her attempted placement, she developed severe right leg pain and weakness.  MRI thoracic spine shows subtle focus of post-contrast enhancement distal cord at T12-L1, concerning for possible edema/acute injury from spinal stimulator placement.  She was in rehab for leg strengthening and continues to get out-patient therapy.  She complains of chronic pain involving the sole of the right foot.  She lives at home in a one-level home with two steps to enter. Lives at home with father.  She works from home.   Out-side paper records, electronic medical record, and images have been reviewed where available and summarized as: NCS/EMG of the left leg 06/08/2020: Chronic L3-4 radiculopathy affecting the left lower extremity, mild. There is no evidence of a sensorimotor polyneuropathy affecting the left lower extremity.  MRI thoracic spine wo contrast 08/12/2020: 1. Subtle focus of increased T2/STIR signal intensity with postcontrast enhancement within the right aspect of the distal cord at the level of T12-L1, new  from previous exam. Given the history of recent spinal stimulator placement, finding suspicious for a small focus of edema/acute spinal cord injury. 2. No other acute abnormality within the thoracic or lumbar spine. No evidence for CSF leak. 3. Prior PLIF at L2 through L5 without residual or recurrent spinal stenosis. Residual right foraminal protrusion at L3-4, closely approximating and potentially irritating the exiting right L3 nerve root, stable.  Lab Results  Component Value Date   HGBA1C 5.1 06/24/2013   No results found for: VITAMINB12 Lab Results  Component Value Date   TSH 1.879 08/26/2020   Lab Results  Component Value Date   ESRSEDRATE 15 01/21/2016    Past Medical History:  Diagnosis Date   Anemia    Cervical cancer ([place])    Family history of adverse reaction to anesthesia     " my mother takes a long time time wake up"   GERD (gastroesophageal reflux disease)    Headache    migraine   History of shingles    Hypertension    Migraine    Multiple allergies    Obesity    Osteoarthritis    left hip   PONV (postoperative nausea and vomiting)    Sleep apnea    does not wear CPAP   Stroke ([place]) 08/11/2020   Wears glasses     Past Surgical History:  Procedure Laterality Date   ABDOMINAL HYSTERECTOMY     APPENDECTOMY     BILIOPANCREATIC DIVERSION     with duodenal switch laparoscopic    CHOLECYSTECTOMY     DIAGNOSTIC LAPAROSCOPY     LOA   ESOPHAGOGASTRODUODENOSCOPY (EGD) WITH PROPOFOL N/A  07/25/2017   Procedure: ESOPHAGOGASTRODUODENOSCOPY (EGD) WITH PROPOFOL;  Surgeon: Manya Silvas, MD;  Location: [facility] ENDOSCOPY;  Service: Endoscopy;  Laterality: N/A;   KNEE ARTHROSCOPY     LAPAROSCOPIC GASTRIC SLEEVE RESECTION WITH HIATAL HERNIA REPAIR     TONSILLECTOMY     TOTAL HIP ARTHROPLASTY Left 01/26/2016   Procedure: LEFT TOTAL HIP ARTHROPLASTY ANTERIOR APPROACH;  Surgeon: Leandrew Koyanagi, MD;  Location: [place];  Service: Orthopedics;  Laterality: Left;    TRANSFORAMINAL LUMBAR INTERBODY FUSION (TLIF) WITH PEDICLE SCREW FIXATION 3 LEVEL  08/2019   Basil Dess, MD L2-L5     Medications:  Outpatient Encounter Medications as of 02/28/2021  Medication Sig Note   aspirin EC 81 MG tablet Take 81 mg by mouth daily. Swallow whole.    azelastine (ASTELIN) 0.1 % nasal spray Place into both nostrils 2 (two) times daily. Use in each nostril as directed    baclofen (LIORESAL) 10 MG tablet Take 1 tablet (10 mg total) by mouth 3 (three) times daily. Take BID but has 3rd dose to take IF NEEDED- for spasms    Biotin w/ Vitamins C & E (HAIR SKIN & NAILS GUMMIES PO) Take by mouth.    Calcium-Phosphorus-Vitamin D (CALCIUM/D3 ADULT GUMMIES PO) Take by mouth.    cephALEXin (KEFLEX) 500 MG capsule Take 500 mg by mouth 4 (four) times daily.    diclofenac Sodium (VOLTAREN) 1 % GEL Apply 4 g topically 4 (four) times daily.    fludrocortisone (FLORINEF) 0.1 MG tablet Take 0.1 mg by mouth 2 (two) times daily.    fluticasone (FLONASE) 50 MCG/ACT nasal spray Place 1-2 sprays into both nostrils daily.    gabapentin (NEURONTIN) 400 MG capsule TAKE 3 CAPSULES(1200 MG) BY MOUTH AT BEDTIME    Lifitegrast 5 % SOLN Apply to eye.    midodrine (PROAMATINE) 10 MG tablet Take 1 tablet (10 mg total) by mouth 3 (three) times daily. While awake. Can take one additional if dizziness    montelukast (SINGULAIR) 10 MG tablet Take 1 tablet (10 mg total) by mouth at bedtime. For allergies    Multiple Vitamin (MULTIVITAMIN WITH MINERALS) TABS tablet Take 1 tablet by mouth 2 (two) times daily.    Multiple Vitamins-Minerals (BARIATRIC FUSION PO) Take by mouth.    pramipexole (MIRAPEX) 0.125 MG tablet Take 0.5 mg by mouth at bedtime. 08/12/2020: Takes 4 tablets at bedtime   predniSONE (DELTASONE) 20 MG tablet Take 20 mg by mouth 2 (two) times daily.    pyridOXINE (VITAMIN B-6) 100 MG tablet Take 1 tablet (100 mg total) by mouth daily.    Thiamine HCl (VITAMIN B-1) 250 MG tablet Take 1 tablet (250  mg total) by mouth daily.    topiramate (TOPAMAX) 100 MG tablet TAKE 1 TABLET(100 MG) BY MOUTH AT BEDTIME    traZODone (DESYREL) 50 MG tablet Take 0.5-1 tablets (25-50 mg total) by mouth at bedtime as needed for sleep.    vitamin B-12 (CYANOCOBALAMIN) 1000 MCG tablet Take 1 tablet (1,000 mcg total) by mouth daily.    [DISCONTINUED] DULoxetine (CYMBALTA) 30 MG capsule Take 1 capsule (30 mg total) by mouth at bedtime. X 1 week, then 60 mg/2 capsules nightly- for nerve pain (Patient not taking: Reported on 02/28/2021)    [DISCONTINUED] methocarbamol (ROBAXIN) 500 MG tablet TAKE 1 TABLET BY MOUTH  EVERY 12 HOURS AS NEEDED  FOR MUSCLE SPASMS (Patient not taking: Reported on 02/28/2021)    [DISCONTINUED] nortriptyline (PAMELOR) 10 MG capsule Take 1 capsule (10 mg total)  by mouth at bedtime.    [DISCONTINUED] promethazine (PHENERGAN) 12.5 MG tablet Take 1 tablet (12.5 mg total) by mouth every 6 (six) hours as needed for nausea or vomiting.    No facility-administered encounter medications on file as of 02/28/2021.    Allergies:  Allergies  Allergen Reactions   Shellfish Allergy Anaphylaxis   Meperidine Hcl Other (See Comments)    BRADYCARDIA   Morphine Other (See Comments)    BRADYCARDIA   Acetaminophen Itching   Bee Pollen Other (See Comments)    Unknown   Ivp Dye [Iodinated Diagnostic Agents] Other (See Comments)    Swelling    Oxycodone Hives and Itching    Blisters on back   Pentazocine Nausea Only and Nausea And Vomiting   Codeine Nausea And Vomiting   Oxycodone-Acetaminophen Itching   Pentazocine Lactate Nausea And Vomiting    REACTION: vomiting with [place] NX   Propoxyphene Nausea Only and Nausea And Vomiting   Propoxyphene N-Acetaminophen Nausea And Vomiting    Family History: Family History  Problem Relation Age of Onset   Renal Disease Mother    Hypertension Mother    Sudden Cardiac Death Mother    Heart failure Mother    Valvular heart disease Mother    Heart disease  Mother    Stroke Brother    Heart attack Brother 57   Diabetes Other    Breast cancer Cousin        paternal side    Social History: Social History   Tobacco Use   Smoking status: Never   Smokeless tobacco: Never  Vaping Use   Vaping Use: Never used  Substance Use Topics   Alcohol use: Yes    Comment: occasional wine   Drug use: No   Social History   Social History Narrative   Right handed    Uses cane to walk     Vital Signs:  BP 111/71   Pulse 61   Ht 5\' 5"  (1.651 m)   Wt 251 lb (113.9 kg)   SpO2 97%   BMI 41.77 kg/m   Neurological Exam: MENTAL STATUS including orientation to time, place, person, recent and remote memory, attention span and concentration, language, and fund of knowledge is normal.  Speech is not dysarthric.  CRANIAL NERVES: II:  No visual field defects.    III-IV-VI: Pupils equal round and reactive to light.  Normal conjugate, extra-ocular eye movements in all directions of gaze.  No nystagmus.  No ptosis.   V:  Normal facial sensation.    VII:  Normal facial symmetry and movements.   VIII:  Normal hearing and vestibular function.   IX-X:  Normal palatal movement.   XI:  Normal shoulder shrug and head rotation.   XII:  Normal tongue strength and range of motion, no deviation or fasciculation.  MOTOR:  There is prominent give way weakness in all the extremities.  All muscle groups are antigravity and able to resist.  Interestingly, she is able to give me 5/5 strength with right foot testing dorsiflexion/plantar flexion.  There is intermittent shakiness of the right hand observed with hand is outstretched which dissipates with activity.   MSRs:  Right        Left                  brachioradialis 2+  2+  biceps 2+  2+  triceps 2+  2+  patellar 2+  2+  ankle jerk 2+  2+  Hoffman no  no  plantar response down  down   SENSORY:  Reduced vibration, temperature, and pin prick at the right foot.  Sensation in the arms and left foot is  intact.  COORDINATION/GAIT: Normal finger-to- nose-finger.  Intact rapid alternating movements bilaterally.  Gait is mildly wide- based, antalgic. Appears stable.    IMPRESSION: Right foot pain and weakness following attempted spinal cord stimulator insertion.  MRI thoracic spine shows tiny focus of T2 enhancement at T12-L1, imaging personally viewed. I am not sure that her chronic pain can be attributed to this. Her exam shows nonphysiologic findings making it difficult to accurately know her true underlying deficits.  To evaluate for a peripheral nerve pathology, she will return for NCS/EMG of the right foot.  For symptom management she is followed by pain management/PM&R.    Thank you for allowing me to participate in patient's care.  If I can answer any additional questions, I would be pleased to do so.    Sincerely,    Egan Sahlin K. Posey Pronto, DO

## 2021-02-28 NOTE — Patient Instructions (Signed)
Nerve testing of the right leg  ELECTROMYOGRAM AND NERVE CONDUCTION STUDIES (EMG/NCS) INSTRUCTIONS  How to Prepare The neurologist conducting the EMG will need to know if you have certain medical conditions. Tell the neurologist and other EMG lab personnel if you: Have a pacemaker or any other electrical medical device Take blood-thinning medications Have hemophilia, a blood-clotting disorder that causes prolonged bleeding Bathing Take a shower or bath shortly before your exam in order to remove oils from your skin. Don't apply lotions or creams before the exam.  What to Expect You'll likely be asked to change into a hospital gown for the procedure and lie down on an examination table. The following explanations can help you understand what will happen during the exam.  Electrodes. The neurologist or a technician places surface electrodes at various locations on your skin depending on where you're experiencing symptoms. Or the neurologist may insert needle electrodes at different sites depending on your symptoms.  Sensations. The electrodes will at times transmit a tiny electrical current that you may feel as a twinge or spasm. The needle electrode may cause discomfort or pain that usually ends shortly after the needle is removed. If you are concerned about discomfort or pain, you may want to talk to the neurologist about taking a short break during the exam.  Instructions. During the needle EMG, the neurologist will assess whether there is any spontaneous electrical activity when the muscle is at rest - activity that isn't present in healthy muscle tissue - and the degree of activity when you slightly contract the muscle.  He or she will give you instructions on resting and contracting a muscle at appropriate times. Depending on what muscles and nerves the neurologist is examining, he or she may ask you to change positions during the exam.  After your EMG You may experience some temporary, minor  bruising where the needle electrode was inserted into your muscle. This bruising should fade within several days. If it persists, contact your primary care doctor.

## 2021-03-01 ENCOUNTER — Other Ambulatory Visit: Payer: Self-pay

## 2021-03-01 DIAGNOSIS — R202 Paresthesia of skin: Secondary | ICD-10-CM

## 2021-03-01 NOTE — Progress Notes (Signed)
emg 

## 2021-03-02 ENCOUNTER — Ambulatory Visit: Payer: 59

## 2021-03-02 ENCOUNTER — Other Ambulatory Visit: Payer: Self-pay

## 2021-03-02 DIAGNOSIS — M6281 Muscle weakness (generalized): Secondary | ICD-10-CM

## 2021-03-02 DIAGNOSIS — R2689 Other abnormalities of gait and mobility: Secondary | ICD-10-CM | POA: Diagnosis not present

## 2021-03-02 DIAGNOSIS — R2681 Unsteadiness on feet: Secondary | ICD-10-CM

## 2021-03-02 NOTE — Therapy (Signed)
[place] MAIN [facility] SERVICES [place], [place], [address] Phone: [phone]   Fax:  [phone]  Physical Therapy Treatment  Patient Details  Name: LATANZA PFEFFERKORN MRN: [id number] Date of Birth: Jun 17, 1971 Referring Provider (PT): Dr. Erling Cruz   Encounter Date: 03/02/2021   PT End of Session - 03/02/21 1440     Visit Number [id number]    Number of Visits 58    Date for PT Re-Evaluation 04/18/21    Authorization Type [employer] Other    Authorization Time Period 01/24/21-04/18/2021    PT Start Time 1444    PT Stop Time 1529    PT Time Calculation (min) 45 min    Equipment Utilized During Treatment Gait belt    Activity Tolerance Patient tolerated treatment well    Behavior During Therapy WFL for tasks assessed/performed             Past Medical History:  Diagnosis Date   Anemia    Cervical cancer ([place])    Family history of adverse reaction to anesthesia     " my mother takes a long time time wake up"   GERD (gastroesophageal reflux disease)    Headache    migraine   History of shingles    Hypertension    Migraine    Multiple allergies    Obesity    Osteoarthritis    left hip   PONV (postoperative nausea and vomiting)    Sleep apnea    does not wear CPAP   Stroke ([place]) 08/11/2020   Wears glasses     Past Surgical History:  Procedure Laterality Date   ABDOMINAL HYSTERECTOMY     APPENDECTOMY     BILIOPANCREATIC DIVERSION     with duodenal switch laparoscopic    CHOLECYSTECTOMY     DIAGNOSTIC LAPAROSCOPY     LOA   ESOPHAGOGASTRODUODENOSCOPY (EGD) WITH PROPOFOL N/A 07/25/2017   Procedure: ESOPHAGOGASTRODUODENOSCOPY (EGD) WITH PROPOFOL;  Surgeon: Manya Silvas, MD;  Location: [facility] END[facility]COPY;  Service: Endoscopy;  Laterality: N/A;   KNEE ARTHROSCOPY     LAPAROSCOPIC GASTRIC SLEEVE RESECTION WITH HIATAL HERNIA REPAIR     TONSILLECTOMY     TOTAL HIP ARTHROPLASTY Left 01/26/2016   Procedure: LEFT TOTAL HIP ARTHROPLASTY  ANTERIOR APPROACH;  Surgeon: Leandrew Koyanagi, MD;  Location: [facility];  Service: Orthopedics;  Laterality: Left;   TRANSFORAMINAL LUMBAR INTERBODY FUSION (TLIF) WITH PEDICLE SCREW FIXATION 3 LEVEL  08/2019   Basil Dess, MD L2-L5    There were no vitals filed for this visit.   Subjective Assessment - 03/02/21 1715     Subjective Patient reports her foot is feeling better from the bee sting. Is feeling more energetic today.    Pertinent History 50 year old female with history of HTN, migraines, morbid obesity--BMI-41, chronic hypotension, multiple back surgeries with chronic pain with LLE lumbar radiculopathy who was scheduled to have spinal cord stimulator placed by Dr. Davy Pique on 08/11/2020 but unable to perform surgery due to scar tissue.  History taken from patient and chart review.  Post op on awakening, she had excruciating pain RLE with plegia and and allodynia. She was admitted for work up by Dr. Ronnald Ramp from surgical center on 08/11/2020. Thoracic MRI done revealing subtle increased T2/STIR intensity distal cord at T12-L1 level suspicious for edema/acute spinal cord injury, no CSF leak as well as prior PLIF L2-L5 with residual foraminal protrusion L3/5 potentially irritating right L3 nerve. She was started on IV Dilaudid, gabapentin  as well as IV Decadron for management of pain, headaches and severe neuropathy.  She continues to have pain in RLE but is having some motor return, has constant headache, decreased hearing in right> left ear, constipation with nausea as well as epigastric pain and weakness. Therapy ongoing and CIR recommended due to functional decline.  Of note, she reports 50 lbs weigh loss in the past 3-4 months--due to issues with N/V/intake. Has had two falls last month---no recall of incident leading to fall question due to syncope. Was in process of work up for renal disease. She has had issue with LLE weakness with pain for the past year and being followed by Dr. Ernestina Patches. Did well with  stimulator trial.    Currently in Pain? Yes    Pain Score 6     Pain Location Back    Pain Orientation Lower    Pain Descriptors / Indicators Aching    Pain Type Chronic pain    Pain Onset In the past 7 days    Pain Frequency Constant                    TREATMENT  Heat provided to back when sitting to reduce back pain Therex:  6" step up/down heavy BUE support due to pain in R leg. 10x each LE 6" step  lateral step up/down occasional UE support 12x each LE BUE support mini squat 12x cues for gluteal activation Bilateral heel raises 15x BUE support  Seated: RTB around bilateral ankles: -ER/IR 12x each LE -alternating LAQ 10x each LE   RTB hamstring curl 10x each LE  Neuro Re-ed: CGA provided throughout  ambulate with horizontal head turns reading cards with SPC 2x 86 ft; close CGA no LOB but increased foot drag with fatigue   Next to support bar:  Airex pad 6' step modified tandem stance 2x 30 seconds each LE placement Airex pad 6" step toe taps no UE support 15x each LE Lateral stepping over half foam roller 10x each LE; patient reports it as easy Half foam roller df/pf 15x with UE uspport    Education provided with VC/TC and demonstration for movement at target joints and to facilitate correct muscle activation with all exercises. Pt exhibits good carryover of technique within session.    Patient is challenged with step negotiation of RLE due to fatigue/pain in knee/quad but is able to tolerate interventions well this session. Continued focus on strengthening and capacity for prolonged activation will benefit patient. Pt will benefit from further skilled PT to improve gait ability, balance and strength to improve functional mobility and QOL                 PT Education - 03/02/21 1440     Education Details exercise technique, body mechanics    Person(s) Educated Patient    Methods Explanation;Demonstration;Tactile cues;Verbal cues     Comprehension Verbalized understanding;Returned demonstration;Verbal cues required;Tactile cues required              PT Short Term Goals - 02/08/21 0831       PT SHORT TERM GOAL #1   Title Pt will be independent with HEP in order to improve strength and balance in order to decrease fall risk and improve function at home and work.    Baseline 10/14/2020 on-going/deferred; 10/28/2020 ongoing pt has resumed HEP, reports doing what she can/HEP to be advanced; 11/18/2020  Pt has had trouble doing HEP d/t fatigue.; 5/23: Pt reports independence  Time 6    Period Weeks    Status Achieved    Target Date 01/24/21               PT Long Term Goals - 02/08/21 0831       PT LONG TERM GOAL #1   Title Patient (< 65 years old) will complete five times sit to stand test in < 10 seconds indicating an increased LE strength and improved balance.    Baseline 12/29: 38.45s; 10/14/2020 42.8 seconds; 10/28/2020 36 seconds; 11/18/2020 34.84 seconds; 12/30/20 41.55sec; 5/23: 27.2 sec    Time 12    Period Weeks    Status On-going    Target Date 04/18/21      PT LONG TERM GOAL #2   Title Pt will decrease TUG to below 14 seconds/decrease in order to demonstrate decreased fall risk.    Baseline 12/29: 32.94s; 10/14/2020 43.12 sec; 10/28/2020 34.12 seconds; 11/18/2020 26.5 sec; 12/30/20: 31sec c SPC; 5/23: 24.6 with SPC    Time 12    Period Weeks    Status On-going    Target Date 04/18/21      PT LONG TERM GOAL #3   Title Pt will decrease DHI score by at least 18 points in order to demonstrate clinically significant reduction in disability    Baseline 12/29: 30; 10/14/2020 62, indicating severe handicap; 10/28/2020 74' 5/23: 72 indicating severe handicap   Not reassessed, but reports no significant change/improvement with dizziness   Time 12    Period Weeks    Status On-going    Target Date 04/18/21      PT LONG TERM GOAL #4   Title Patient will increase 10 meter walk test to >1.107m/s as to improve gait  speed for better community ambulation and to reduce fall risk.    Baseline 12/29: 0.37 m/s; 0.38 m/s; 2/10/08/2020 0.31 m/s 11/18/2020 0.34 m/s with SPC; 0.31m/s 12/30/20; 5/23: 0.45 m/s with SPC    Time 12    Period Weeks    Status On-going    Target Date 04/18/21      PT LONG TERM GOAL #5   Title Patient will increase FOTO score to equal to or greater than 64 to demonstrate statistically significant improvement in mobility and quality of life.    Baseline 12/29: 52; 10/14/2020 45; 10/28/2020 45; 11/18/2020 45; 12/30/20 44; 5/23: 51    Time 12    Period Weeks    Status On-going    Target Date 04/18/21      Additional Long Term Goals   Additional Long Term Goals Yes      PT LONG TERM GOAL #6   Title Patient will increase six minute walk test distance to >1000 for progression to community ambulator and improve gait ability    Baseline 6/6: 788 ft with [facility]    Time 10    Period Weeks    Target Date 04/18/21                   Plan - 03/02/21 1718     Clinical Impression Statement Patient is challenged with step negotiation of RLE due to fatigue/pain in knee/quad but is able to tolerate interventions well this session. Continued focus on strengthening and capacity for prolonged activation will benefit patient. Pt will benefit from further skilled PT to improve gait ability, balance and strength to improve functional mobility and QOL    Personal Factors and Comorbidities Comorbidity 3+;Time since onset of injury/illness/exacerbation    Comorbidities  HYPOtension, paraparesis, SCI without spinal bone injury, DDD, cervical cancer, acute kidney injury, GERD, asthma, bradycardia, L total hip replacement, hypokalemia    Examination-Activity Limitations Squat;Lift;Stairs;Bend;Stand;Reach Overhead;Carry;Transfers    Examination-Participation Restrictions Cleaning;Laundry;Meal Prep;Community Activity;Driving;Occupation    Stability/Clinical Decision Making Evolving/Moderate complexity    Rehab  Potential Fair    PT Frequency 3x / week    PT Duration 12 weeks    PT Treatment/Interventions ADLs/Self Care Home Management;Aquatic Therapy;Gait training;Stair training;Functional mobility training;Therapeutic activities;Therapeutic exercise;Balance training;Moist Heat;Manual techniques;Patient/family education;Neuromuscular re-education;Passive range of motion;Energy conservation;Vestibular;Joint Manipulations;Spinal Manipulations    PT Next Visit Plan endurance, dynamic and static balance,endurance    PT Home Exercise Plan PROGRESS note: add new HEP, initiate UBE bike for cardiovasc endurance, R ankle motor control/strength, focus on STS and ambulation; added 11/18/2020 (handout) hip adduction, thoracic lumbar extension and sit<>stand with counter support; no changes    Consulted and Agree with Plan of Care Patient             Patient will benefit from skilled therapeutic intervention in order to improve the following deficits and impairments:  Abnormal gait, Dizziness, Improper body mechanics, Decreased mobility, Cardiopulmonary status limiting activity, Decreased activity tolerance, Decreased endurance, Decreased range of motion, Decreased strength, Decreased balance, Decreased safety awareness, Difficulty walking, Impaired flexibility, Obesity  Visit Diagnosis: Unsteadiness on feet  Muscle weakness (generalized)  Other abnormalities of gait and mobility     Problem List Patient Active Problem List   Diagnosis Date Noted   Insomnia due to medical condition 02/25/2021   Myofascial muscle pain 02/25/2021   Nerve pain 10/18/2020   Incomplete paraplegia (HCC) 10/18/2020   Orthostatic hypotension 10/18/2020   Chronic migraine without aura without status migrainosus, not intractable    Hypokalemia    Adjustment reaction with anxiety and depression    Spinal cord injury, lumbar, without spinal bone injury, sequela ([place]) 08/18/2020   Paraparesis ([place])    Post-operative pain     Hypotension    Slow transit constipation    AKI (acute kidney injury) ([place])    Right leg weakness 08/11/2020   DDD (degenerative disc disease), lumbar 09/02/2019    Class: Chronic   Degenerative disc disease, lumbar 09/02/2019   Chest tightness 09/23/2018   Bradycardia 09/23/2018   Labile blood pressure 03/08/2018   Chronic low back pain 09/03/2017   History of total hip replacement, left 01/26/2016   EDEMA 04/28/2008   LEG PAIN, BILATERAL 12/16/2007   CERVICAL CANCER 07/02/2007   MORBID OBESITY 07/02/2007   DEPRESSION 07/02/2007   COMMON MIGRAINE 07/02/2007   Allergic rhinitis 07/02/2007   ASTHMA 07/02/2007   GERD 07/02/2007   ELEVATED BLOOD PRESSURE WITHOUT DIAGNOSIS OF HYPERTENSION 07/02/2007    Janna Arch, PT, DPT  03/02/2021, 5:20 PM  [place] [facility] MAIN [facility] SERVICES [address] [place], [place], [address] Phone: [phone]   Fax:  [phone]  Name: MAYCI HANING MRN: [id number] Date of Birth: [birth year]

## 2021-03-08 ENCOUNTER — Other Ambulatory Visit: Payer: Self-pay

## 2021-03-08 ENCOUNTER — Ambulatory Visit: Payer: 59 | Attending: Physical Medicine and Rehabilitation

## 2021-03-08 DIAGNOSIS — R296 Repeated falls: Secondary | ICD-10-CM | POA: Diagnosis present

## 2021-03-08 DIAGNOSIS — G8929 Other chronic pain: Secondary | ICD-10-CM | POA: Diagnosis present

## 2021-03-08 DIAGNOSIS — M5442 Lumbago with sciatica, left side: Secondary | ICD-10-CM | POA: Insufficient documentation

## 2021-03-08 DIAGNOSIS — R2689 Other abnormalities of gait and mobility: Secondary | ICD-10-CM | POA: Diagnosis present

## 2021-03-08 DIAGNOSIS — M5441 Lumbago with sciatica, right side: Secondary | ICD-10-CM | POA: Diagnosis present

## 2021-03-08 DIAGNOSIS — R42 Dizziness and giddiness: Secondary | ICD-10-CM | POA: Insufficient documentation

## 2021-03-08 DIAGNOSIS — R262 Difficulty in walking, not elsewhere classified: Secondary | ICD-10-CM | POA: Insufficient documentation

## 2021-03-08 DIAGNOSIS — R278 Other lack of coordination: Secondary | ICD-10-CM | POA: Diagnosis present

## 2021-03-08 DIAGNOSIS — M6281 Muscle weakness (generalized): Secondary | ICD-10-CM | POA: Diagnosis present

## 2021-03-08 DIAGNOSIS — R2681 Unsteadiness on feet: Secondary | ICD-10-CM | POA: Insufficient documentation

## 2021-03-08 DIAGNOSIS — S34109S Unspecified injury to unspecified level of lumbar spinal cord, sequela: Secondary | ICD-10-CM | POA: Insufficient documentation

## 2021-03-08 DIAGNOSIS — M545 Low back pain, unspecified: Secondary | ICD-10-CM | POA: Insufficient documentation

## 2021-03-08 NOTE — Therapy (Signed)
[place] MAIN [facility] SERVICES [place], [place], [address] Phone: [phone]   Fax:  [phone]  Physical Therapy Treatment  Patient Details  Name: Vanessa Medina MRN: [id number] Date of Birth: 1971/09/02 Referring Provider (PT): Dr. Erling Cruz   Encounter Date: 03/08/2021   PT End of Session - 03/08/21 1705     Visit Number [id number]    Number of Visits 26    Date for PT Re-Evaluation 04/18/21    Authorization Type [employer] Other    Authorization Time Period 01/24/21-04/18/2021    PT Start Time 1345    PT Stop Time 1429    PT Time Calculation (min) 44 min    Equipment Utilized During Treatment Gait belt    Activity Tolerance Patient tolerated treatment well    Behavior During Therapy WFL for tasks assessed/performed             Past Medical History:  Diagnosis Date   Anemia    Cervical cancer ([place])    Family history of adverse reaction to anesthesia     " my mother takes a long time time wake up"   GERD (gastroesophageal reflux disease)    Headache    migraine   History of shingles    Hypertension    Migraine    Multiple allergies    Obesity    Osteoarthritis    left hip   PONV (postoperative nausea and vomiting)    Sleep apnea    does not wear CPAP   Stroke ([place]) 08/11/2020   Wears glasses     Past Surgical History:  Procedure Laterality Date   ABDOMINAL HYSTERECTOMY     APPENDECTOMY     BILIOPANCREATIC DIVERSION     with duodenal switch laparoscopic    CHOLECYSTECTOMY     DIAGNOSTIC LAPAROSCOPY     LOA   ESOPHAGOGASTRODUODENOSCOPY (EGD) WITH PROPOFOL N/A 07/25/2017   Procedure: ESOPHAGOGASTRODUODENOSCOPY (EGD) WITH PROPOFOL;  Surgeon: Manya Silvas, MD;  Location: [facility] ENDOSCOPY;  Service: Endoscopy;  Laterality: N/A;   KNEE ARTHROSCOPY     LAPAROSCOPIC GASTRIC SLEEVE RESECTION WITH HIATAL HERNIA REPAIR     TONSILLECTOMY     TOTAL HIP ARTHROPLASTY Left 01/26/2016   Procedure: LEFT TOTAL HIP ARTHROPLASTY  ANTERIOR APPROACH;  Surgeon: Leandrew Koyanagi, MD;  Location: [place];  Service: Orthopedics;  Laterality: Left;   TRANSFORAMINAL LUMBAR INTERBODY FUSION (TLIF) WITH PEDICLE SCREW FIXATION 3 LEVEL  08/2019   Basil Dess, MD L2-L5    There were no vitals filed for this visit.   Subjective Assessment - 03/08/21 1703     Subjective Patient reports friday through saturday she had vestibular/vertigo issues that made her bed bound. Is feeling better today.    Pertinent History 50 year old female with history of HTN, migraines, morbid obesity--BMI-41, chronic hypotension, multiple back surgeries with chronic pain with LLE lumbar radiculopathy who was scheduled to have spinal cord stimulator placed by Dr. Davy Pique on 08/11/2020 but unable to perform surgery due to scar tissue.  History taken from patient and chart review.  Post op on awakening, she had excruciating pain RLE with plegia and and allodynia. She was admitted for work up by Dr. Ronnald Ramp from surgical center on 08/11/2020. Thoracic MRI done revealing subtle increased T2/STIR intensity distal cord at T12-L1 level suspicious for edema/acute spinal cord injury, no CSF leak as well as prior PLIF L2-L5 with residual foraminal protrusion L3/5 potentially irritating right L3 nerve. She was started on IV  Dilaudid, gabapentin as well as IV Decadron for management of pain, headaches and severe neuropathy.  She continues to have pain in RLE but is having some motor return, has constant headache, decreased hearing in right> left ear, constipation with nausea as well as epigastric pain and weakness. Therapy ongoing and CIR recommended due to functional decline.  Of note, she reports 50 lbs weigh loss in the past 3-4 months--due to issues with N/V/intake. Has had two falls last month---no recall of incident leading to fall question due to syncope. Was in process of work up for renal disease. She has had issue with LLE weakness with pain for the past year and being followed by  Dr. Ernestina Patches. Did well with stimulator trial.    Currently in Pain? Yes    Pain Score 4     Pain Location Back    Pain Orientation Lower    Pain Descriptors / Indicators Aching    Pain Type Chronic pain    Pain Onset In the past 7 days    Pain Frequency Constant               Neuro Re-ed:close CGA with cues for sequencing and safety  Airex pad; static stand 30 seconds; good ankle righting reactions Airex pad: narrow base of support 45 seconds  Airex pad: dynadisc modified tandem stance 2x30 seconds each LE placement. Very challenging for patient to perform  Bosu: flat side up; static stand 30 seconds Bosu: flat side up mini squats 10x  5 cones: Weaving between cones with SPC; patient reports as easy x 2 sets Forward/backward negotiation of cones with SPC; no LOB ; equal steppage L and R Lateral step toe taps 2x; patient reports slight increase in challenge due to single limb demands  Single limb stance 30 seconds each LE  TherEx:cues for body mechanics and sequencing for optimal muscle recruitment standing:  heel raise bilateral 20x, single leg 10x Sit to stand 10x  Seated:  GTB hamstring curl 10x each LE ; cues for control with eccentric portion GTB around bilateral ankle: alternate ER/IR; challenging but able to be performed.  GTB around bilateral ankles: alternate LAQ ; cues for holding range for optimal muscle recruitment    Pt educated throughout session about proper posture and technique with exercises. Improved exercise technique, movement at target joints, use of target muscles after min to mod verbal, visual, tactile cues  Patient is highly motivated throughout session despite recent round of vertigo. Will be meeting with vestibular therapist next session to discuss recent symptoms. She tolerates standing and seated strengthening and stability interventions well with good ankle righting reactions. Pt will benefit from further skilled PT to improve gait ability,  balance and strength to improve functional mobility and QOL                PT Education - 03/08/21 1704     Education Details exercise technique, body mechanics    Person(s) Educated Patient    Methods Explanation;Demonstration;Tactile cues;Verbal cues    Comprehension Verbalized understanding;Returned demonstration;Verbal cues required;Tactile cues required              PT Short Term Goals - 02/08/21 0831       PT SHORT TERM GOAL #1   Title Pt will be independent with HEP in order to improve strength and balance in order to decrease fall risk and improve function at home and work.    Baseline 10/14/2020 on-going/deferred; 10/28/2020 ongoing pt has resumed HEP, reports doing  what she can/HEP to be advanced; 11/18/2020  Pt has had trouble doing HEP d/t fatigue.; 5/23: Pt reports independence    Time 6    Period Weeks    Status Achieved    Target Date 01/24/21               PT Long Term Goals - 02/08/21 0831       PT LONG TERM GOAL #1   Title Patient (< 1 years old) will complete five times sit to stand test in < 10 seconds indicating an increased LE strength and improved balance.    Baseline 12/29: 38.45s; 10/14/2020 42.8 seconds; 10/28/2020 36 seconds; 11/18/2020 34.84 seconds; 12/30/20 41.55sec; 5/23: 27.2 sec    Time 12    Period Weeks    Status On-going    Target Date 04/18/21      PT LONG TERM GOAL #2   Title Pt will decrease TUG to below 14 seconds/decrease in order to demonstrate decreased fall risk.    Baseline 12/29: 32.94s; 10/14/2020 43.12 sec; 10/28/2020 34.12 seconds; 11/18/2020 26.5 sec; 12/30/20: 31sec c SPC; 5/23: 24.6 with SPC    Time 12    Period Weeks    Status On-going    Target Date 04/18/21      PT LONG TERM GOAL #3   Title Pt will decrease DHI score by at least 18 points in order to demonstrate clinically significant reduction in disability    Baseline 12/29: 30; 10/14/2020 62, indicating severe handicap; 10/28/2020 74' 5/23: 72 indicating  severe handicap   Not reassessed, but reports no significant change/improvement with dizziness   Time 12    Period Weeks    Status On-going    Target Date 04/18/21      PT LONG TERM GOAL #4   Title Patient will increase 10 meter walk test to >1.65m/s as to improve gait speed for better community ambulation and to reduce fall risk.    Baseline 12/29: 0.37 m/s; 0.38 m/s; 2/10/08/2020 0.31 m/s 11/18/2020 0.34 m/s with SPC; 0.75m/s 12/30/20; 5/23: 0.45 m/s with SPC    Time 12    Period Weeks    Status On-going    Target Date 04/18/21      PT LONG TERM GOAL #5   Title Patient will increase FOTO score to equal to or greater than 64 to demonstrate statistically significant improvement in mobility and quality of life.    Baseline 12/29: 52; 10/14/2020 45; 10/28/2020 45; 11/18/2020 45; 12/30/20 44; 5/23: 51    Time 12    Period Weeks    Status On-going    Target Date 04/18/21      Additional Long Term Goals   Additional Long Term Goals Yes      PT LONG TERM GOAL #6   Title Patient will increase six minute walk test distance to >1000 for progression to community ambulator and improve gait ability    Baseline 6/6: 788 ft with [facility]    Time 10    Period Weeks    Target Date 04/18/21                   Plan - 03/08/21 1709     Clinical Impression Statement Patient is highly motivated throughout session despite recent round of vertigo. Will be meeting with vestibular therapist next session to discuss recent symptoms. She tolerates standing and seated strengthening and stability interventions well with good ankle righting reactions. Pt will benefit from further skilled PT to improve gait  ability, balance and strength to improve functional mobility and QOL    Personal Factors and Comorbidities Comorbidity 3+;Time since onset of injury/illness/exacerbation    Comorbidities HYPOtension, paraparesis, SCI without spinal bone injury, DDD, cervical cancer, acute kidney injury, GERD, asthma, bradycardia,  L total hip replacement, hypokalemia    Examination-Activity Limitations Squat;Lift;Stairs;Bend;Stand;Reach Overhead;Carry;Transfers    Examination-Participation Restrictions Cleaning;Laundry;Meal Prep;Community Activity;Driving;Occupation    Stability/Clinical Decision Making Evolving/Moderate complexity    Rehab Potential Fair    PT Frequency 3x / week    PT Duration 12 weeks    PT Treatment/Interventions ADLs/Self Care Home Management;Aquatic Therapy;Gait training;Stair training;Functional mobility training;Therapeutic activities;Therapeutic exercise;Balance training;Moist Heat;Manual techniques;Patient/family education;Neuromuscular re-education;Passive range of motion;Energy conservation;Vestibular;Joint Manipulations;Spinal Manipulations    PT Next Visit Plan endurance, dynamic and static balance,endurance    PT Home Exercise Plan PROGRESS note: add new HEP, initiate UBE bike for cardiovasc endurance, R ankle motor control/strength, focus on STS and ambulation; added 11/18/2020 (handout) hip adduction, thoracic lumbar extension and sit<>stand with counter support; no changes    Consulted and Agree with Plan of Care Patient             Patient will benefit from skilled therapeutic intervention in order to improve the following deficits and impairments:  Abnormal gait, Dizziness, Improper body mechanics, Decreased mobility, Cardiopulmonary status limiting activity, Decreased activity tolerance, Decreased endurance, Decreased range of motion, Decreased strength, Decreased balance, Decreased safety awareness, Difficulty walking, Impaired flexibility, Obesity  Visit Diagnosis: Unsteadiness on feet  Muscle weakness (generalized)  Other abnormalities of gait and mobility     Problem List Patient Active Problem List   Diagnosis Date Noted   Insomnia due to medical condition 02/25/2021   Myofascial muscle pain 02/25/2021   Nerve pain 10/18/2020   Incomplete paraplegia (HCC) 10/18/2020    Orthostatic hypotension 10/18/2020   Chronic migraine without aura without status migrainosus, not intractable    Hypokalemia    Adjustment reaction with anxiety and depression    Spinal cord injury, lumbar, without spinal bone injury, sequela ([place]) 08/18/2020   Paraparesis ([place])    Post-operative pain    Hypotension    Slow transit constipation    AKI (acute kidney injury) ([place])    Right leg weakness 08/11/2020   DDD (degenerative disc disease), lumbar 09/02/2019    Class: Chronic   Degenerative disc disease, lumbar 09/02/2019   Chest tightness 09/23/2018   Bradycardia 09/23/2018   Labile blood pressure 03/08/2018   Chronic low back pain 09/03/2017   History of total hip replacement, left 01/26/2016   EDEMA 04/28/2008   LEG PAIN, BILATERAL 12/16/2007   CERVICAL CANCER 07/02/2007   MORBID OBESITY 07/02/2007   DEPRESSION 07/02/2007   COMMON MIGRAINE 07/02/2007   Allergic rhinitis 07/02/2007   ASTHMA 07/02/2007   GERD 07/02/2007   ELEVATED BLOOD PRESSURE WITHOUT DIAGNOSIS OF HYPERTENSION 07/02/2007    Janna Arch, PT, DPT  03/08/2021, 5:10 PM  [place] [facility] MAIN [facility] SERVICES [address] [place], [place], [address] Phone: [phone]   Fax:  [phone]  Name: Vanessa Medina MRN: [id number] Date of Birth: 09/28/1970

## 2021-03-10 ENCOUNTER — Encounter: Payer: Self-pay | Admitting: Student

## 2021-03-10 ENCOUNTER — Ambulatory Visit: Payer: 59 | Admitting: Student

## 2021-03-10 ENCOUNTER — Other Ambulatory Visit: Payer: Self-pay | Admitting: *Deleted

## 2021-03-10 ENCOUNTER — Ambulatory Visit: Payer: 59

## 2021-03-10 ENCOUNTER — Other Ambulatory Visit: Payer: Self-pay

## 2021-03-10 VITALS — BP 115/75 | HR 96 | Temp 98.8°F | Resp 16 | Ht 65.0 in | Wt 249.8 lb

## 2021-03-10 DIAGNOSIS — R2681 Unsteadiness on feet: Secondary | ICD-10-CM

## 2021-03-10 DIAGNOSIS — I951 Orthostatic hypotension: Secondary | ICD-10-CM

## 2021-03-10 DIAGNOSIS — G903 Multi-system degeneration of the autonomic nervous system: Secondary | ICD-10-CM

## 2021-03-10 DIAGNOSIS — M6281 Muscle weakness (generalized): Secondary | ICD-10-CM

## 2021-03-10 DIAGNOSIS — R2689 Other abnormalities of gait and mobility: Secondary | ICD-10-CM

## 2021-03-10 NOTE — Progress Notes (Signed)
Primary Physician/Referring:  [place] Pink, MD  Patient ID: Vanessa Medina, female    DOB: Jan 12, 1971, 51 y.o.   MRN: [id number]  No chief complaint on file.  HPI:    Vanessa Medina  is a 50 y.o. with chronic dizziness, morbid obesity, history of gastric sleeve procedure in 2015 and revision in 2019. Previously seen by Dr. Ida Rogue, and originally referred to our office for evaluation of postural orthostatic tachycardia syndrome, however review of her record shows patient does not have POTS.  Rather patient only has tachycardia component of POTS.    Patient symptoms of orthostasis started approximately 5 years ago and may be related to underlying nutritional deficiency following bariatric procedure, polypharmacy as well.   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    Patient presents for 58-month follow-up of orthostatic hypotension.  She continues to follow closely with physical medicine.  At last office visit no changes were made as symptoms were stable.  Patient continues to struggle with vertigo for which she has previously undergone vestibular rehab without much improvement of symptoms.  Is currently being treated by Dr. Dagoberto Ligas with injections.  Patient's symptoms of dizziness and syncope worsened when attempted to reduce Florinef.  Patient's symptoms remain stable, she has had no recurrence of ectopy or near syncope.  She continues with liberal hydration, support stockings, and conservative management.  She denies chest pain, palpitations, dyspnea, leg swelling, orthopnea. She does continue to have chronic back pain. She is slowly recovering, continues to do PT twice weekly without symptomatic hypotension.   Past Medical History:  Diagnosis Date    Anemia    Cervical cancer ([place])    Family history of adverse reaction to anesthesia     " my mother takes a long time time wake up"   GERD (gastroesophageal reflux disease)    Headache    migraine   History of shingles    Hypertension    Migraine    Multiple allergies    Obesity    Osteoarthritis    left hip   PONV (postoperative nausea and vomiting)    Sleep apnea    does not wear CPAP   Stroke ([place]) 08/11/2020   Wears glasses    Past Surgical History:  Procedure Laterality Date   ABDOMINAL HYSTERECTOMY     APPENDECTOMY     BILIOPANCREATIC DIVERSION     with duodenal switch laparoscopic    CHOLECYSTECTOMY     DIAGNOSTIC LAPAROSCOPY     LOA   ESOPHAGOGASTRODUODENOSCOPY (EGD) WITH PROPOFOL N/A 07/25/2017   Procedure: ESOPHAGOGASTRODUODENOSCOPY (EGD) WITH PROPOFOL;  Surgeon: Manya Silvas, MD;  Location: [facility] ENDOSCOPY;  Service: Endoscopy;  Laterality: N/A;   KNEE ARTHROSCOPY     LAPAROSCOPIC GASTRIC SLEEVE RESECTION WITH HIATAL HERNIA REPAIR     TONSILLECTOMY     TOTAL HIP ARTHROPLASTY Left 01/26/2016   Procedure: LEFT TOTAL HIP ARTHROPLASTY ANTERIOR APPROACH;  Surgeon: Leandrew Koyanagi, MD;  Location: [place];  Service: Orthopedics;  Laterality: Left;   TRANSFORAMINAL LUMBAR INTERBODY FUSION (TLIF) WITH PEDICLE SCREW FIXATION 3 LEVEL  08/2019   Basil Dess, MD L2-L5   Family History  Problem Relation Age of Onset   Renal Disease Mother    Hypertension Mother    Sudden  Cardiac Death Mother    Heart failure Mother    Valvular heart disease Mother    Heart disease Mother    Stroke Brother    Heart attack Brother 15   Diabetes Other    Breast cancer Cousin        paternal side    Social History   Tobacco Use   Smoking status: Never   Smokeless tobacco: Never  Substance Use Topics   Alcohol use: Yes    Comment: occasional wine   Marital Status: Divorced   ROS  Review of Systems  Cardiovascular:  Negative for chest pain, claudication, dyspnea on exertion,  leg swelling, near-syncope, orthopnea, palpitations, paroxysmal nocturnal dyspnea and syncope.  Respiratory:  Negative for shortness of breath.   Hematologic/Lymphatic: Does not bruise/bleed easily.  Musculoskeletal:  Positive for arthritis, back pain and muscle weakness.  Gastrointestinal:  Negative for melena.  Neurological:  Positive for dizziness (vertigo). Negative for weakness.  Objective  Blood pressure 115/75, pulse 96, temperature 98.8 F (37.1 C), temperature source Temporal, resp. rate 16, height 5\' 5"  (1.651 m), weight 249 lb 12.8 oz (113.3 kg), SpO2 97 %. Body mass index is 41.57 kg/m.   Vitals with BMI 03/10/2021 02/28/2021 02/25/2021  Height 5\' 5"  5\' 5"  5\' 5"   Weight 249 lbs 13 oz 251 lbs 253 lbs  BMI 41.57 63.87 56.4  Systolic 332 951 99  Diastolic 75 71 66  Pulse 96 61 72   Orthostatic VS for the past 72 hrs (Last 3 readings):  Orthostatic BP Patient Position BP Location Cuff Size Orthostatic Pulse  03/10/21 1310 110/65 Standing Left Arm Large 92  03/10/21 1309 115/67 Sitting Left Arm Large 87  03/10/21 1307 111/62 Supine Left Arm Large 86    Physical Exam Vitals reviewed.  Constitutional:      Comments: Morbidly obese in no acute distress. Ambulating with cane   Cardiovascular:     Rate and Rhythm: Normal rate and regular rhythm.     Pulses: Intact distal pulses.          Carotid pulses are 2+ on the right side and 2+ on the left side.      Dorsalis pedis pulses are 2+ on the right side and 2+ on the left side.       Posterior tibial pulses are 2+ on the right side and 2+ on the left side.     Heart sounds: Normal heart sounds, S1 normal and S2 normal. No murmur heard.   No gallop.     Comments: Femoral and popliteal pulse difficult to feel due to patient's body habitus.  No leg edema. JVD difficult to see due to short neck. Pulmonary:     Effort: Pulmonary effort is normal. No respiratory distress.     Breath sounds: Normal breath sounds. No wheezing, rhonchi  or rales.  Abdominal:     Comments: Obese. Pannus present  Musculoskeletal:     Right lower leg: No edema.     Left lower leg: No edema.  Neurological:     Mental Status: She is alert.   Laboratory examination:   Recent Labs    08/24/20 0420 08/26/20 0407 08/30/20 0459  NA 139 139 139  K 3.9 3.7 3.6  CL 109 107 109  CO2 20* 22 20*  GLUCOSE 83 92 92  BUN 23* 18 13  CREATININE 0.98 0.99 0.95  CALCIUM 9.3 8.9 8.8*  GFRNONAA >60 >60 >60   CrCl cannot be calculated (Patient's most recent  lab result is older than the maximum 21 days allowed.).  CMP Latest Ref Rng & Units 08/30/2020 08/26/2020 08/24/2020  Glucose 70 - 99 mg/dL 92 92 83  BUN 6 - 20 mg/dL 13 18 23(H)  Creatinine 0.44 - 1.00 mg/dL 0.95 0.99 0.98  Sodium 135 - 145 mmol/L 139 139 139  Potassium 3.5 - 5.1 mmol/L 3.6 3.7 3.9  Chloride 98 - 111 mmol/L 109 107 109  CO2 22 - 32 mmol/L 20(L) 22 20(L)  Calcium 8.9 - 10.3 mg/dL 8.8(L) 8.9 9.3  Total Protein 6.5 - 8.1 g/dL - - -  Total Bilirubin 0.3 - 1.2 mg/dL - - -  Alkaline Phos 38 - 126 U/L - - -  AST 15 - 41 U/L - - -  ALT 0 - 44 U/L - - -   CBC Latest Ref Rng & Units 08/30/2020 08/23/2020 08/19/2020  WBC 4.0 - 10.5 K/uL 4.4 7.4 8.6  Hemoglobin 12.0 - 15.0 g/dL 9.3(L) 10.3(L) 10.8(L)  Hematocrit 36.0 - 46.0 % 28.0(L) 32.1(L) 32.8(L)  Platelets 150 - 400 K/uL 202 170 164    Lipid Panel No results for input(s): CHOL, TRIG, LDLCALC, VLDL, HDL, CHOLHDL, LDLDIRECT in the last 8760 hours.  HEMOGLOBIN A1C Lab Results  Component Value Date   HGBA1C 5.1 06/24/2013   TSH Recent Labs    08/26/20 0407  TSH 1.879    External labs:  None  Allergies   Allergies  Allergen Reactions   Shellfish Allergy Anaphylaxis   Meperidine Hcl Other (See Comments)    BRADYCARDIA   Morphine Other (See Comments)    BRADYCARDIA   Acetaminophen Itching   Bee Pollen Other (See Comments)    Unknown   Ivp Dye [Iodinated Diagnostic Agents] Other (See Comments)    Swelling     Oxycodone Hives and Itching    Blisters on back   Pentazocine Nausea Only and Nausea And Vomiting   Codeine Nausea And Vomiting   Oxycodone-Acetaminophen Itching   Pentazocine Lactate Nausea And Vomiting    REACTION: vomiting with [place] NX   Propoxyphene Nausea Only and Nausea And Vomiting   Propoxyphene N-Acetaminophen Nausea And Vomiting    Medications Prior to Visit:   Outpatient Medications Prior to Visit  Medication Sig Dispense Refill   aspirin EC 81 MG tablet Take 81 mg by mouth daily. Swallow whole.     azelastine (ASTELIN) 0.1 % nasal spray Place into both nostrils 2 (two) times daily. Use in each nostril as directed     baclofen (LIORESAL) 10 MG tablet Take 1 tablet (10 mg total) by mouth 3 (three) times daily. Take BID but has 3rd dose to take IF NEEDED- for spasms 90 tablet 5   Biotin w/ Vitamins C & E (HAIR SKIN & NAILS GUMMIES PO) Take by mouth.     Calcium-Phosphorus-Vitamin D (CALCIUM/D3 ADULT GUMMIES PO) Take by mouth.     cephALEXin (KEFLEX) 500 MG capsule Take 500 mg by mouth 4 (four) times daily.     diclofenac Sodium (VOLTAREN) 1 % GEL Apply 4 g topically 4 (four) times daily. 350 g 3   fludrocortisone (FLORINEF) 0.1 MG tablet Take 0.1 mg by mouth 2 (two) times daily.     fluticasone (FLONASE) 50 MCG/ACT nasal spray Place 1-2 sprays into both nostrils daily.     gabapentin (NEURONTIN) 400 MG capsule TAKE 3 CAPSULES(1200 MG) BY MOUTH AT BEDTIME 90 capsule 5   Lifitegrast 5 % SOLN Apply to eye.     midodrine (PROAMATINE)  10 MG tablet Take 1 tablet (10 mg total) by mouth 3 (three) times daily. While awake. Can take one additional if dizziness 270 tablet 3   montelukast (SINGULAIR) 10 MG tablet Take 1 tablet (10 mg total) by mouth at bedtime. For allergies 30 tablet 5   Multiple Vitamin (MULTIVITAMIN WITH MINERALS) TABS tablet Take 1 tablet by mouth 2 (two) times daily.     Multiple Vitamins-Minerals (BARIATRIC FUSION PO) Take by mouth.     pramipexole (MIRAPEX)  0.125 MG tablet Take 0.5 mg by mouth at bedtime.     predniSONE (DELTASONE) 20 MG tablet Take 20 mg by mouth 2 (two) times daily.     pyridOXINE (VITAMIN B-6) 100 MG tablet Take 1 tablet (100 mg total) by mouth daily. 90 tablet 1   Thiamine HCl (VITAMIN B-1) 250 MG tablet Take 1 tablet (250 mg total) by mouth daily. 90 tablet 1   topiramate (TOPAMAX) 100 MG tablet TAKE 1 TABLET(100 MG) BY MOUTH AT BEDTIME 90 tablet 5   traZODone (DESYREL) 50 MG tablet Take 0.5-1 tablets (25-50 mg total) by mouth at bedtime as needed for sleep. 15 tablet 0   vitamin B-12 (CYANOCOBALAMIN) 1000 MCG tablet Take 1 tablet (1,000 mcg total) by mouth daily. 90 tablet 1   No facility-administered medications prior to visit.   Final Medications at End of Visit    Current Meds  Medication Sig   aspirin EC 81 MG tablet Take 81 mg by mouth daily. Swallow whole.   azelastine (ASTELIN) 0.1 % nasal spray Place into both nostrils 2 (two) times daily. Use in each nostril as directed   baclofen (LIORESAL) 10 MG tablet Take 1 tablet (10 mg total) by mouth 3 (three) times daily. Take BID but has 3rd dose to take IF NEEDED- for spasms   Biotin w/ Vitamins C & E (HAIR SKIN & NAILS GUMMIES PO) Take by mouth.   Calcium-Phosphorus-Vitamin D (CALCIUM/D3 ADULT GUMMIES PO) Take by mouth.   cephALEXin (KEFLEX) 500 MG capsule Take 500 mg by mouth 4 (four) times daily.   diclofenac Sodium (VOLTAREN) 1 % GEL Apply 4 g topically 4 (four) times daily.   fludrocortisone (FLORINEF) 0.1 MG tablet Take 0.1 mg by mouth 2 (two) times daily.   fluticasone (FLONASE) 50 MCG/ACT nasal spray Place 1-2 sprays into both nostrils daily.   gabapentin (NEURONTIN) 400 MG capsule TAKE 3 CAPSULES(1200 MG) BY MOUTH AT BEDTIME   Lifitegrast 5 % SOLN Apply to eye.   midodrine (PROAMATINE) 10 MG tablet Take 1 tablet (10 mg total) by mouth 3 (three) times daily. While awake. Can take one additional if dizziness   montelukast (SINGULAIR) 10 MG tablet Take 1 tablet  (10 mg total) by mouth at bedtime. For allergies   Multiple Vitamin (MULTIVITAMIN WITH MINERALS) TABS tablet Take 1 tablet by mouth 2 (two) times daily.   Multiple Vitamins-Minerals (BARIATRIC FUSION PO) Take by mouth.   pramipexole (MIRAPEX) 0.125 MG tablet Take 0.5 mg by mouth at bedtime.   predniSONE (DELTASONE) 20 MG tablet Take 20 mg by mouth 2 (two) times daily.   pyridOXINE (VITAMIN B-6) 100 MG tablet Take 1 tablet (100 mg total) by mouth daily.   Thiamine HCl (VITAMIN B-1) 250 MG tablet Take 1 tablet (250 mg total) by mouth daily.   topiramate (TOPAMAX) 100 MG tablet TAKE 1 TABLET(100 MG) BY MOUTH AT BEDTIME   traZODone (DESYREL) 50 MG tablet Take 0.5-1 tablets (25-50 mg total) by mouth at bedtime as needed for  sleep.   vitamin B-12 (CYANOCOBALAMIN) 1000 MCG tablet Take 1 tablet (1,000 mcg total) by mouth daily.   Radiology:   No results found.  Cardiac Studies:   Echocardiogram 08/14/2018: - Left ventricle: The cavity size was normal. Wall thickness was    increased in a pattern of mild LVH. Systolic function was normal.   The estimated ejection fraction was in the range of 55% to 60%.   Wall motion was normal; there were no regional wall motion   abnormalities. Left ventricular diastolic function parameters   were normal. - Right ventricle: The cavity size was normal. Wall thickness was   normal. Systolic function was normal.  EKG:   03/10/2021: Sinus rhythm at a rate of 76 bpm.  Normal axis.  No evidence of ischemia or underlying injury pattern.  Compared to EKG 09/10/2020, no significant change.  Assessment     ICD-10-CM   1. Neurogenic orthostatic hypotension (HCC)  G90.3 EKG 12-Lead    2. Orthostasis  I95.1        There are no discontinued medications.   No orders of the defined types were placed in this encounter.  Orders Placed This Encounter  Procedures   EKG 12-Lead     Recommendations:   Vanessa Medina is a 50 y.o. with chronic dizziness, morbid  obesity, history of gastric sleeve procedure in 2015 and revision in 2019. Previously seen by Dr. Ida Rogue, and originally referred to our office for evaluation of postural orthostatic tachycardia syndrome, however review of her record shows patient does not have POTS.  Rather patient only has tachycardia component of POTS.    Patient symptoms of orthostasis started approximately 5 years ago and may be related to underlying nutritional deficiency following bariatric procedure, polypharmacy as well.   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    Patient presents for 66-month follow-up of orthostatic hypotension.  She continues to follow closely with physical medicine.  At last office visit no changes were made as symptoms were stable.  Patient is presently on midodrine 10 mg 3 times daily and Florinef 0.2 mg daily for orthostatic hypotension per physical medicine.  Patient has had no recurrence of syncope or near syncope.  Her symptoms of orthostasis are well controlled with conservative management: For now and management, will not make changes to medication regimen at this time.  From a cardiovascular standpoint patient is stable no changes recommended at this time.  Patient will continue to follow with Dr. Verdene Rio closely.  Encourage patient to continue to focus on healthy diet and lifestyle modifications including weight loss.  Follow-up in 6 months, sooner if needed, for orthostatic hypotension.   Alethia Berthold, PA-C 03/10/2021, 2:44 PM Office: [phone]

## 2021-03-10 NOTE — Therapy (Signed)
[place] MAIN [facility] SERVICES [place], [place], [address] Phone: [phone]   Fax:  [phone]  Physical Therapy Treatment  Patient Details  Name: Vanessa Medina MRN: [id number] Date of Birth: June 05, 1971 Referring Provider (PT): Dr. Erling Cruz   Encounter Date: 03/10/2021   PT End of Session - 03/10/21 1654     Visit Number 61    Number of Visits 3    Date for PT Re-Evaluation 04/18/21    Authorization Type [employer] Other    Authorization Time Period 01/24/21-04/18/2021    PT Start Time 1501    PT Stop Time 1549    PT Time Calculation (min) 48 min    Equipment Utilized During Treatment Gait belt    Activity Tolerance Patient tolerated treatment well    Behavior During Therapy WFL for tasks assessed/performed             Past Medical History:  Diagnosis Date   Anemia    Cervical cancer ([place])    Family history of adverse reaction to anesthesia     " my mother takes a long time time wake up"   GERD (gastroesophageal reflux disease)    Headache    migraine   History of shingles    Hypertension    Migraine    Multiple allergies    Obesity    Osteoarthritis    left hip   PONV (postoperative nausea and vomiting)    Sleep apnea    does not wear CPAP   Stroke ([place]) 08/11/2020   Wears glasses     Past Surgical History:  Procedure Laterality Date   ABDOMINAL HYSTERECTOMY     APPENDECTOMY     BILIOPANCREATIC DIVERSION     with duodenal switch laparoscopic    CHOLECYSTECTOMY     DIAGNOSTIC LAPAROSCOPY     LOA   ESOPHAGOGASTRODUODENOSCOPY (EGD) WITH PROPOFOL N/A 07/25/2017   Procedure: ESOPHAGOGASTRODUODENOSCOPY (EGD) WITH PROPOFOL;  Surgeon: Manya Silvas, MD;  Location: [facility] ENDOSCOPY;  Service: Endoscopy;  Laterality: N/A;   KNEE ARTHROSCOPY     LAPAROSCOPIC GASTRIC SLEEVE RESECTION WITH HIATAL HERNIA REPAIR     TONSILLECTOMY     TOTAL HIP ARTHROPLASTY Left 01/26/2016   Procedure: LEFT TOTAL HIP ARTHROPLASTY  ANTERIOR APPROACH;  Surgeon: Leandrew Koyanagi, MD;  Location: [place];  Service: Orthopedics;  Laterality: Left;   TRANSFORAMINAL LUMBAR INTERBODY FUSION (TLIF) WITH PEDICLE SCREW FIXATION 3 LEVEL  08/2019   Basil Dess, MD L2-L5    There were no vitals filed for this visit.   Subjective Assessment - 03/10/21 1457     Subjective Pt reports she had a good appointment with her cardiologist. She said everything looked good. Pt reports no dizziness currently. She says when she had vertigo the other day she had a headahce afterwards. She says dizziness subsided by Saturday evening.    Pertinent History 50 year old female with history of HTN, migraines, morbid obesity--BMI-41, chronic hypotension, multiple back surgeries with chronic pain with LLE lumbar radiculopathy who was scheduled to have spinal cord stimulator placed by Dr. Davy Pique on 08/11/2020 but unable to perform surgery due to scar tissue.  History taken from patient and chart review.  Post op on awakening, she had excruciating pain RLE with plegia and and allodynia. She was admitted for work up by Dr. Ronnald Ramp from surgical center on 08/11/2020. Thoracic MRI done revealing subtle increased T2/STIR intensity distal cord at T12-L1 level suspicious for edema/acute spinal cord injury, no  CSF leak as well as prior PLIF L2-L5 with residual foraminal protrusion L3/5 potentially irritating right L3 nerve. She was started on IV Dilaudid, gabapentin as well as IV Decadron for management of pain, headaches and severe neuropathy.  She continues to have pain in RLE but is having some motor return, has constant headache, decreased hearing in right> left ear, constipation with nausea as well as epigastric pain and weakness. Therapy ongoing and CIR recommended due to functional decline.  Of note, she reports 50 lbs weigh loss in the past 3-4 months--due to issues with N/V/intake. Has had two falls last month---no recall of incident leading to fall question due to syncope. Was  in process of work up for renal disease. She has had issue with LLE weakness with pain for the past year and being followed by Dr. Ernestina Patches. Did well with stimulator trial.    Pain Onset In the past 7 days             TREATMENT   Nustep level 4 - x 5 min, pt maintains SPM in 70s  (unbilled)    Neuro Re-ed:close CGA with cues for sequencing and safety   Korebalance- Tux racer - x4 rounds. Pt was progressed to more complex maps/landscapes to navigate. Pt uses decreasing levels of UE support to no UE support. With decreasing levels of support pt struggles with lateral control, decreased stability at ankles.   In // bars: On airex pad: -static stand with NBOS 2x30 sec. Pt exhibits good ankle righting reactions -NBOS with horizontal head turns - pt tends to lose balance to L side with L head turn and/or posteriorly; pt uses UE support to regain balance and CGA from PT -NBOS with vertical head turns- pt exhibits multiple posterior LOB, using UE assist and CGA from PT to regain balance -EC NBOS - loses balance posteriorly, requires UE support and CGA from PT to regain balance  Firm surface:  -Static stand NBOS 30 sec no LOB -Static stand NBOS with horizontal head turns - x multiple reps; no LOB but slight decrease in postural stability -Static stand NBOS with vertical head turns - x multiple reps; pt continues to exhibit loss of balance posteriorly, using UE support and CGA from PT to regain balance.  Static stand EC 2x30 sec, decreased postural stability  Tandem stance - 2x30 sec BLEs Semi tandem stance - 30 sec BLEs Semi tandem with vertical head turns 30 esc Semi tandem with horizontal head turns 30 sec NBOS EC Single limb stance 30 seconds each LE; intermittent UE support       PT Short Term Goals - 02/08/21 0831       PT SHORT TERM GOAL #1   Title Pt will be independent with HEP in order to improve strength and balance in order to decrease fall risk and improve function at  home and work.    Baseline 10/14/2020 on-going/deferred; 10/28/2020 ongoing pt has resumed HEP, reports doing what she can/HEP to be advanced; 11/18/2020  Pt has had trouble doing HEP d/t fatigue.; 5/23: Pt reports independence    Time 6    Period Weeks    Status Achieved    Target Date 01/24/21               PT Long Term Goals - 02/08/21 0831       PT LONG TERM GOAL #1   Title Patient (< 2 years old) will complete five times sit to stand test in < 10 seconds indicating  an increased LE strength and improved balance.    Baseline 12/29: 38.45s; 10/14/2020 42.8 seconds; 10/28/2020 36 seconds; 11/18/2020 34.84 seconds; 12/30/20 41.55sec; 5/23: 27.2 sec    Time 12    Period Weeks    Status On-going    Target Date 04/18/21      PT LONG TERM GOAL #2   Title Pt will decrease TUG to below 14 seconds/decrease in order to demonstrate decreased fall risk.    Baseline 12/29: 32.94s; 10/14/2020 43.12 sec; 10/28/2020 34.12 seconds; 11/18/2020 26.5 sec; 12/30/20: 31sec c SPC; 5/23: 24.6 with SPC    Time 12    Period Weeks    Status On-going    Target Date 04/18/21      PT LONG TERM GOAL #3   Title Pt will decrease DHI score by at least 18 points in order to demonstrate clinically significant reduction in disability    Baseline 12/29: 30; 10/14/2020 62, indicating severe handicap; 10/28/2020 74' 5/23: 72 indicating severe handicap   Not reassessed, but reports no significant change/improvement with dizziness   Time 12    Period Weeks    Status On-going    Target Date 04/18/21      PT LONG TERM GOAL #4   Title Patient will increase 10 meter walk test to >1.26m/s as to improve gait speed for better community ambulation and to reduce fall risk.    Baseline 12/29: 0.37 m/s; 0.38 m/s; 2/10/08/2020 0.31 m/s 11/18/2020 0.34 m/s with SPC; 0.63m/s 12/30/20; 5/23: 0.45 m/s with SPC    Time 12    Period Weeks    Status On-going    Target Date 04/18/21      PT LONG TERM GOAL #5   Title Patient will increase FOTO  score to equal to or greater than 64 to demonstrate statistically significant improvement in mobility and quality of life.    Baseline 12/29: 52; 10/14/2020 45; 10/28/2020 45; 11/18/2020 45; 12/30/20 44; 5/23: 51    Time 12    Period Weeks    Status On-going    Target Date 04/18/21      Additional Long Term Goals   Additional Long Term Goals Yes      PT LONG TERM GOAL #6   Title Patient will increase six minute walk test distance to >1000 for progression to community ambulator and improve gait ability    Baseline 6/6: 788 ft with [facility]    Time 10    Period Weeks    Target Date 04/18/21                   Plan - 03/10/21 1704     Clinical Impression Statement Pt able to be progressed with majority of balance tasks, indicating carryover between sessions. The pt requires at least UUE support with [facility] for improved ankle righting reactions and reduced lateral instability. The pt was also challenged with EC and head-turns when standing on a compliant surface. The pt will benefit from further skilled pt to continue to improve gait ablity, balance and strength to increase safety with all functional mobilty.    Personal Factors and Comorbidities Comorbidity 3+;Time since onset of injury/illness/exacerbation    Comorbidities HYPOtension, paraparesis, SCI without spinal bone injury, DDD, cervical cancer, acute kidney injury, GERD, asthma, bradycardia, L total hip replacement, hypokalemia    Examination-Activity Limitations Squat;Lift;Stairs;Bend;Stand;Reach Overhead;Carry;Transfers    Examination-Participation Restrictions Cleaning;Laundry;Meal Prep;Community Activity;Driving;Occupation    Stability/Clinical Decision Making Evolving/Moderate complexity    Rehab Potential Fair    PT  Frequency 3x / week    PT Duration 12 weeks    PT Treatment/Interventions ADLs/Self Care Home Management;Aquatic Therapy;Gait training;Stair training;Functional mobility training;Therapeutic  activities;Therapeutic exercise;Balance training;Moist Heat;Manual techniques;Patient/family education;Neuromuscular re-education;Passive range of motion;Energy conservation;Vestibular;Joint Manipulations;Spinal Manipulations    PT Next Visit Plan endurance, dynamic and static balance,endurance    PT Home Exercise Plan PROGRESS note: add new HEP, initiate UBE bike for cardiovasc endurance, R ankle motor control/strength, focus on STS and ambulation; added 11/18/2020 (handout) hip adduction, thoracic lumbar extension and sit<>stand with counter support; no changes    Consulted and Agree with Plan of Care Patient             Patient will benefit from skilled therapeutic intervention in order to improve the following deficits and impairments:  Abnormal gait, Dizziness, Improper body mechanics, Decreased mobility, Cardiopulmonary status limiting activity, Decreased activity tolerance, Decreased endurance, Decreased range of motion, Decreased strength, Decreased balance, Decreased safety awareness, Difficulty walking, Impaired flexibility, Obesity  Visit Diagnosis: Unsteadiness on feet  Other abnormalities of gait and mobility  Muscle weakness (generalized)     Problem List Patient Active Problem List   Diagnosis Date Noted   Insomnia due to medical condition 02/25/2021   Myofascial muscle pain 02/25/2021   Nerve pain 10/18/2020   Incomplete paraplegia (HCC) 10/18/2020   Orthostatic hypotension 10/18/2020   Chronic migraine without aura without status migrainosus, not intractable    Hypokalemia    Adjustment reaction with anxiety and depression    Spinal cord injury, lumbar, without spinal bone injury, sequela ([place]) 08/18/2020   Paraparesis ([place])    Post-operative pain    Hypotension    Slow transit constipation    AKI (acute kidney injury) ([place])    Right leg weakness 08/11/2020   DDD (degenerative disc disease), lumbar 09/02/2019    Class: Chronic   Degenerative disc disease,  lumbar 09/02/2019   Chest tightness 09/23/2018   Bradycardia 09/23/2018   Labile blood pressure 03/08/2018   Chronic low back pain 09/03/2017   History of total hip replacement, left 01/26/2016   EDEMA 04/28/2008   LEG PAIN, BILATERAL 12/16/2007   CERVICAL CANCER 07/02/2007   MORBID OBESITY 07/02/2007   DEPRESSION 07/02/2007   COMMON MIGRAINE 07/02/2007   Allergic rhinitis 07/02/2007   ASTHMA 07/02/2007   GERD 07/02/2007   ELEVATED BLOOD PRESSURE WITHOUT DIAGNOSIS OF HYPERTENSION 07/02/2007   Ricard Dillon PT, DPT 03/10/2021, 5:07 PM   [facility] MAIN [facility] SERVICES [address] [place], [place], [address] Phone: [phone]   Fax:  [phone]  Name: AVREY FLANAGIN MRN: [id number] Date of Birth: [birth year]

## 2021-03-15 ENCOUNTER — Other Ambulatory Visit: Payer: Self-pay

## 2021-03-15 ENCOUNTER — Ambulatory Visit: Payer: 59

## 2021-03-15 DIAGNOSIS — M6281 Muscle weakness (generalized): Secondary | ICD-10-CM

## 2021-03-15 DIAGNOSIS — R2681 Unsteadiness on feet: Secondary | ICD-10-CM | POA: Diagnosis not present

## 2021-03-15 DIAGNOSIS — R2689 Other abnormalities of gait and mobility: Secondary | ICD-10-CM

## 2021-03-15 NOTE — Therapy (Signed)
[facility] MAIN [facility] SERVICES [place], [place], [address] Phone: [phone]   Fax:  [phone]  Physical Therapy Treatment  Patient Details  Name: Vanessa Medina MRN: [id number] Date of Birth: 04/07/1971 Referring Provider (PT): Dr. Erling Cruz   Encounter Date: 03/15/2021   PT End of Session - 03/15/21 1612     Visit Number 90    Number of Visits 5    Date for PT Re-Evaluation 04/18/21    Authorization Type [employer] Other    Authorization Time Period 01/24/21-04/18/2021    PT Start Time 1512    PT Stop Time 1558    PT Time Calculation (min) 46 min    Equipment Utilized During Treatment Gait belt    Activity Tolerance Patient tolerated treatment well    Behavior During Therapy WFL for tasks assessed/performed             Past Medical History:  Diagnosis Date   Anemia    Cervical cancer ([place])    Family history of adverse reaction to anesthesia     " my mother takes a long time time wake up"   GERD (gastroesophageal reflux disease)    Headache    migraine   History of shingles    Hypertension    Migraine    Multiple allergies    Obesity    Osteoarthritis    left hip   PONV (postoperative nausea and vomiting)    Sleep apnea    does not wear CPAP   Stroke ([place]) 08/11/2020   Wears glasses     Past Surgical History:  Procedure Laterality Date   ABDOMINAL HYSTERECTOMY     APPENDECTOMY     BILIOPANCREATIC DIVERSION     with duodenal switch laparoscopic    CHOLECYSTECTOMY     DIAGNOSTIC LAPAROSCOPY     LOA   ESOPHAGOGASTRODUODENOSCOPY (EGD) WITH PROPOFOL N/A 07/25/2017   Procedure: ESOPHAGOGASTRODUODENOSCOPY (EGD) WITH PROPOFOL;  Surgeon: Manya Silvas, MD;  Location: [facility] ENDOSCOPY;  Service: Endoscopy;  Laterality: N/A;   KNEE ARTHROSCOPY     LAPAROSCOPIC GASTRIC SLEEVE RESECTION WITH HIATAL HERNIA REPAIR     TONSILLECTOMY     TOTAL HIP ARTHROPLASTY Left 01/26/2016   Procedure: LEFT TOTAL HIP ARTHROPLASTY  ANTERIOR APPROACH;  Surgeon: Leandrew Koyanagi, MD;  Location: [facility];  Service: Orthopedics;  Laterality: Left;   TRANSFORAMINAL LUMBAR INTERBODY FUSION (TLIF) WITH PEDICLE SCREW FIXATION 3 LEVEL  08/2019   Basil Dess, MD L2-L5    There were no vitals filed for this visit.    Subjective Assessment - 03/15/21 1609     Subjective Pt reports lower back pain currently. She reports continuing inner-ear fluttering sensation.    Pertinent History 50 year old female with history of HTN, migraines, morbid obesity--BMI-41, chronic hypotension, multiple back surgeries with chronic pain with LLE lumbar radiculopathy who was scheduled to have spinal cord stimulator placed by Dr. Davy Pique on 08/11/2020 but unable to perform surgery due to scar tissue.  History taken from patient and chart review.  Post op on awakening, she had excruciating pain RLE with plegia and and allodynia. She was admitted for work up by Dr. Ronnald Ramp from surgical center on 08/11/2020. Thoracic MRI done revealing subtle increased T2/STIR intensity distal cord at T12-L1 level suspicious for edema/acute spinal cord injury, no CSF leak as well as prior PLIF L2-L5 with residual foraminal protrusion L3/5 potentially irritating right L3 nerve. She was started on IV Dilaudid, gabapentin as well as  IV Decadron for management of pain, headaches and severe neuropathy.  She continues to have pain in RLE but is having some motor return, has constant headache, decreased hearing in right> left ear, constipation with nausea as well as epigastric pain and weakness. Therapy ongoing and CIR recommended due to functional decline.  Of note, she reports 50 lbs weigh loss in the past 3-4 months--due to issues with N/V/intake. Has had two falls last month---no recall of incident leading to fall question due to syncope. Was in process of work up for renal disease. She has had issue with LLE weakness with pain for the past year and being followed by Dr. Ernestina Patches. Did well with  stimulator trial.    Currently in Pain? Yes    Pain Location Back    Pain Onset In the past 7 days              TREATMENT   Treadmill training for increased cardioresp. And LE mm endurance. Addition of gait analysis feature. Pt ambulates for approx 6 minutes. She reaches speed of 1.2 mph, RPE 6/10, and HR up to 115 bpm. Analysis reveals increased step length and stance time on LLE compared to RLE.  STS -2x10. UE assist. Rest break between sets  Standing hip abduction in // bars - 2x15-20 BLEs.   Leg press -@25 #, pt performs 3x15 reps.   Walking high-knee marches with decreasing UE support for further challenge - x multiple reps up and down length of bars. Improved AROM.    Education provided throughout session via VC/TC and demonstration to facilitate movement at target joints and correct muscle activation for all testing and exercises performed    PT Education - 03/15/21 1611     Education Details exercise technique, body mechanics with leg press    Person(s) Educated Patient    Methods Explanation;Demonstration;Verbal cues    Comprehension Verbalized understanding;Returned demonstration              PT Short Term Goals - 02/08/21 0831       PT SHORT TERM GOAL #1   Title Pt will be independent with HEP in order to improve strength and balance in order to decrease fall risk and improve function at home and work.    Baseline 10/14/2020 on-going/deferred; 10/28/2020 ongoing pt has resumed HEP, reports doing what she can/HEP to be advanced; 11/18/2020  Pt has had trouble doing HEP d/t fatigue.; 5/23: Pt reports independence    Time 6    Period Weeks    Status Achieved    Target Date 01/24/21               PT Long Term Goals - 02/08/21 0831       PT LONG TERM GOAL #1   Title Patient (< 20 years old) will complete five times sit to stand test in < 10 seconds indicating an increased LE strength and improved balance.    Baseline 12/29: 38.45s; 10/14/2020 42.8  seconds; 10/28/2020 36 seconds; 11/18/2020 34.84 seconds; 12/30/20 41.55sec; 5/23: 27.2 sec    Time 12    Period Weeks    Status On-going    Target Date 04/18/21      PT LONG TERM GOAL #2   Title Pt will decrease TUG to below 14 seconds/decrease in order to demonstrate decreased fall risk.    Baseline 12/29: 32.94s; 10/14/2020 43.12 sec; 10/28/2020 34.12 seconds; 11/18/2020 26.5 sec; 12/30/20: 31sec c SPC; 5/23: 24.6 with SPC    Time 12  Period Weeks    Status On-going    Target Date 04/18/21      PT LONG TERM GOAL #3   Title Pt will decrease DHI score by at least 18 points in order to demonstrate clinically significant reduction in disability    Baseline 12/29: 30; 10/14/2020 62, indicating severe handicap; 10/28/2020 74' 5/23: 72 indicating severe handicap   Not reassessed, but reports no significant change/improvement with dizziness   Time 12    Period Weeks    Status On-going    Target Date 04/18/21      PT LONG TERM GOAL #4   Title Patient will increase 10 meter walk test to >1.69m/s as to improve gait speed for better community ambulation and to reduce fall risk.    Baseline 12/29: 0.37 m/s; 0.38 m/s; 2/10/08/2020 0.31 m/s 11/18/2020 0.34 m/s with SPC; 0.12m/s 12/30/20; 5/23: 0.45 m/s with SPC    Time 12    Period Weeks    Status On-going    Target Date 04/18/21      PT LONG TERM GOAL #5   Title Patient will increase FOTO score to equal to or greater than 64 to demonstrate statistically significant improvement in mobility and quality of life.    Baseline 12/29: 52; 10/14/2020 45; 10/28/2020 45; 11/18/2020 45; 12/30/20 44; 5/23: 51    Time 12    Period Weeks    Status On-going    Target Date 04/18/21      Additional Long Term Goals   Additional Long Term Goals Yes      PT LONG TERM GOAL #6   Title Patient will increase six minute walk test distance to >1000 for progression to community ambulator and improve gait ability    Baseline 6/6: 788 ft with SPC    Time 10    Period Weeks     Target Date 04/18/21                   Plan - 03/15/21 1612     Clinical Impression Statement Pt continues to show progress with progressing to treadmill walking, where she ambulated up to 1.2 mph, RPE reached 6/10, and pt HR reached 115 bpm. Gait analysis on treadmill indicates pt with slight increase in LLE step-length compared to R, and increased stance time on LLE compared to RLE. The pt was also able to progress to using the leg press for LE resistance training. She reports the leg press is challenging. The pt will continue to benefit from further skilled PT to improve BLE strength, gait and balance in order to increase ease with all functional mobility and QOL.    Personal Factors and Comorbidities Comorbidity 3+;Time since onset of injury/illness/exacerbation    Comorbidities HYPOtension, paraparesis, SCI without spinal bone injury, DDD, cervical cancer, acute kidney injury, GERD, asthma, bradycardia, L total hip replacement, hypokalemia    Examination-Activity Limitations Squat;Lift;Stairs;Bend;Stand;Reach Overhead;Carry;Transfers    Examination-Participation Restrictions Cleaning;Laundry;Meal Prep;Community Activity;Driving;Occupation    Stability/Clinical Decision Making Evolving/Moderate complexity    Rehab Potential Fair    PT Frequency 3x / week    PT Duration 12 weeks    PT Treatment/Interventions ADLs/Self Care Home Management;Aquatic Therapy;Gait training;Stair training;Functional mobility training;Therapeutic activities;Therapeutic exercise;Balance training;Moist Heat;Manual techniques;Patient/family education;Neuromuscular re-education;Passive range of motion;Energy conservation;Vestibular;Joint Manipulations;Spinal Manipulations    PT Next Visit Plan endurance, dynamic and static balance,endurance, treadmill, leg press    PT Home Exercise Plan PROGRESS note: add new HEP, initiate UBE bike for cardiovasc endurance, R ankle motor control/strength, focus on  STS and  ambulation; added 11/18/2020 (handout) hip adduction, thoracic lumbar extension and sit<>stand with counter support; no changes    Consulted and Agree with Plan of Care Patient             Patient will benefit from skilled therapeutic intervention in order to improve the following deficits and impairments:  Abnormal gait, Dizziness, Improper body mechanics, Decreased mobility, Cardiopulmonary status limiting activity, Decreased activity tolerance, Decreased endurance, Decreased range of motion, Decreased strength, Decreased balance, Decreased safety awareness, Difficulty walking, Impaired flexibility, Obesity  Visit Diagnosis: Muscle weakness (generalized)  Other abnormalities of gait and mobility  Unsteadiness on feet     Problem List Patient Active Problem List   Diagnosis Date Noted   Insomnia due to medical condition 02/25/2021   Myofascial muscle pain 02/25/2021   Nerve pain 10/18/2020   Incomplete paraplegia (HCC) 10/18/2020   Orthostatic hypotension 10/18/2020   Chronic migraine without aura without status migrainosus, not intractable    Hypokalemia    Adjustment reaction with anxiety and depression    Spinal cord injury, lumbar, without spinal bone injury, sequela ([place]) 08/18/2020   Paraparesis ([place])    Post-operative pain    Hypotension    Slow transit constipation    AKI (acute kidney injury) ([place])    Right leg weakness 08/11/2020   DDD (degenerative disc disease), lumbar 09/02/2019    Class: Chronic   Degenerative disc disease, lumbar 09/02/2019   Chest tightness 09/23/2018   Bradycardia 09/23/2018   Labile blood pressure 03/08/2018   Chronic low back pain 09/03/2017   History of total hip replacement, left 01/26/2016   EDEMA 04/28/2008   LEG PAIN, BILATERAL 12/16/2007   CERVICAL CANCER 07/02/2007   MORBID OBESITY 07/02/2007   DEPRESSION 07/02/2007   COMMON MIGRAINE 07/02/2007   Allergic rhinitis 07/02/2007   ASTHMA 07/02/2007   GERD 07/02/2007    ELEVATED BLOOD PRESSURE WITHOUT DIAGNOSIS OF HYPERTENSION 07/02/2007   Ricard Dillon PT, DPT 03/15/2021, 4:19 PM  [facility] MAIN [facility] SERVICES [address] [place], [place], [address] Phone: [phone]   Fax:  [phone]  Name: SHERY WAUNEKA MRN: [id number] Date of Birth: December 18, 1970

## 2021-03-17 ENCOUNTER — Other Ambulatory Visit: Payer: Self-pay

## 2021-03-17 ENCOUNTER — Ambulatory Visit: Payer: 59

## 2021-03-17 DIAGNOSIS — R278 Other lack of coordination: Secondary | ICD-10-CM

## 2021-03-17 DIAGNOSIS — R2681 Unsteadiness on feet: Secondary | ICD-10-CM | POA: Diagnosis not present

## 2021-03-17 DIAGNOSIS — M6281 Muscle weakness (generalized): Secondary | ICD-10-CM

## 2021-03-17 DIAGNOSIS — R2689 Other abnormalities of gait and mobility: Secondary | ICD-10-CM

## 2021-03-17 NOTE — Therapy (Signed)
[place] MAIN [facility] SERVICES [place], [place], [address] Phone: [phone]   Fax:  [phone]  Physical Therapy Treatment  Patient Details  Name: Vanessa Medina MRN: [id number] Date of Birth: 02/27/1971 Referring Provider (PT): Dr. Erling Cruz   Encounter Date: 03/17/2021   PT End of Session - 03/18/21 0807     Visit Number 89    Number of Visits 60    Date for PT Re-Evaluation 04/18/21    Authorization Type [employer] Other    Authorization Time Period 01/24/21-04/18/2021    PT Start Time 1602    PT Stop Time 1645    PT Time Calculation (min) 43 min    Equipment Utilized During Treatment Gait belt    Activity Tolerance Patient tolerated treatment well    Behavior During Therapy WFL for tasks assessed/performed             Past Medical History:  Diagnosis Date   Anemia    Cervical cancer ([place])    Family history of adverse reaction to anesthesia     " my mother takes a long time time wake up"   GERD (gastroesophageal reflux disease)    Headache    migraine   History of shingles    Hypertension    Migraine    Multiple allergies    Obesity    Osteoarthritis    left hip   PONV (postoperative nausea and vomiting)    Sleep apnea    does not wear CPAP   Stroke ([place]) 08/11/2020   Wears glasses     Past Surgical History:  Procedure Laterality Date   ABDOMINAL HYSTERECTOMY     APPENDECTOMY     BILIOPANCREATIC DIVERSION     with duodenal switch laparoscopic    CHOLECYSTECTOMY     DIAGNOSTIC LAPAROSCOPY     LOA   ESOPHAGOGASTRODUODENOSCOPY (EGD) WITH PROPOFOL N/A 07/25/2017   Procedure: ESOPHAGOGASTRODUODENOSCOPY (EGD) WITH PROPOFOL;  Surgeon: Manya Silvas, MD;  Location: [facility] ENDOSCOPY;  Service: Endoscopy;  Laterality: N/A;   KNEE ARTHROSCOPY     LAPAROSCOPIC GASTRIC SLEEVE RESECTION WITH HIATAL HERNIA REPAIR     TONSILLECTOMY     TOTAL HIP ARTHROPLASTY Left 01/26/2016   Procedure: LEFT TOTAL HIP ARTHROPLASTY  ANTERIOR APPROACH;  Surgeon: Leandrew Koyanagi, MD;  Location: [facility];  Service: Orthopedics;  Laterality: Left;   TRANSFORAMINAL LUMBAR INTERBODY FUSION (TLIF) WITH PEDICLE SCREW FIXATION 3 LEVEL  08/2019   Basil Dess, MD L2-L5    There were no vitals filed for this visit.   Subjective Assessment - 03/17/21 1559     Subjective Pt reports she is currently working on projects to help the family of her friend who recently passed away in a MVA. She reports some low back pain over the past few days, which she thinks is due to sitting for long periods of time. No pain currently.    Pertinent History 50 year old female with history of HTN, migraines, morbid obesity--BMI-41, chronic hypotension, multiple back surgeries with chronic pain with LLE lumbar radiculopathy who was scheduled to have spinal cord stimulator placed by Dr. Davy Pique on 08/11/2020 but unable to perform surgery due to scar tissue.  History taken from patient and chart review.  Post op on awakening, she had excruciating pain RLE with plegia and and allodynia. She was admitted for work up by Dr. Ronnald Ramp from surgical center on 08/11/2020. Thoracic MRI done revealing subtle increased T2/STIR intensity distal cord at T12-L1 level  suspicious for edema/acute spinal cord injury, no CSF leak as well as prior PLIF L2-L5 with residual foraminal protrusion L3/5 potentially irritating right L3 nerve. She was started on IV Dilaudid, gabapentin as well as IV Decadron for management of pain, headaches and severe neuropathy.  She continues to have pain in RLE but is having some motor return, has constant headache, decreased hearing in right> left ear, constipation with nausea as well as epigastric pain and weakness. Therapy ongoing and CIR recommended due to functional decline.  Of note, she reports 50 lbs weigh loss in the past 3-4 months--due to issues with N/V/intake. Has had two falls last month---no recall of incident leading to fall question due to syncope. Was in  process of work up for renal disease. She has had issue with LLE weakness with pain for the past year and being followed by Dr. Ernestina Patches. Did well with stimulator trial.    Currently in Pain? No/denies    Pain Onset In the past 7 days            TREATMENT    THEREX-  Treadmill training for increased cardioresp. And LE mm endurance. CGA throughout. Pt ambulates for approx 6 minutes. She reaches speed of 1.4 mph, RPE 7/10, and HR up to 107 bpm. Vitals taken. BP after exercise- Seated: 104/78 mmHg HR 85 bpm HR variability but no dizziness during exercise Pt initially a little off balance upon stepping off of treadmill but reports no sx andotherwise felt fine. Pt provides min assist to help pt regain balance.   Seated matrix cable machine 2.5# 1x12 BLEs, and 7.5# for 2x12   Walking high-knee marches in // bars followed by backward walking for increased activation of glutes, with decreasing UE support for further challenge - x multiple reps up and down length of bars. Improved AROM. Some difficulty with LLE with backward walking.   Step-ups onto 6" step with BUE support - 3x15 alternating Les.   Education provided throughout session via VC/TC and demonstration to facilitate movement at target joints and correct muscle activation for all exercises performed      PT Education - 03/18/21 0807     Education Details exercise technique, body mechanics with matrix cable machine exercises    Person(s) Educated Patient    Methods Explanation;Demonstration;Tactile cues;Verbal cues    Comprehension Verbalized understanding;Returned demonstration              PT Short Term Goals - 02/08/21 0831       PT SHORT TERM GOAL #1   Title Pt will be independent with HEP in order to improve strength and balance in order to decrease fall risk and improve function at home and work.    Baseline 10/14/2020 on-going/deferred; 10/28/2020 ongoing pt has resumed HEP, reports doing what she can/HEP to be  advanced; 11/18/2020  Pt has had trouble doing HEP d/t fatigue.; 5/23: Pt reports independence    Time 6    Period Weeks    Status Achieved    Target Date 01/24/21               PT Long Term Goals - 02/08/21 0831       PT LONG TERM GOAL #1   Title Patient (< 54 years old) will complete five times sit to stand test in < 10 seconds indicating an increased LE strength and improved balance.    Baseline 12/29: 38.45s; 10/14/2020 42.8 seconds; 10/28/2020 36 seconds; 11/18/2020 34.84 seconds; 12/30/20 41.55sec; 5/23: 27.2 sec  Time 12    Period Weeks    Status On-going    Target Date 04/18/21      PT LONG TERM GOAL #2   Title Pt will decrease TUG to below 14 seconds/decrease in order to demonstrate decreased fall risk.    Baseline 12/29: 32.94s; 10/14/2020 43.12 sec; 10/28/2020 34.12 seconds; 11/18/2020 26.5 sec; 12/30/20: 31sec c SPC; 5/23: 24.6 with SPC    Time 12    Period Weeks    Status On-going    Target Date 04/18/21      PT LONG TERM GOAL #3   Title Pt will decrease DHI score by at least 18 points in order to demonstrate clinically significant reduction in disability    Baseline 12/29: 30; 10/14/2020 62, indicating severe handicap; 10/28/2020 74' 5/23: 72 indicating severe handicap   Not reassessed, but reports no significant change/improvement with dizziness   Time 12    Period Weeks    Status On-going    Target Date 04/18/21      PT LONG TERM GOAL #4   Title Patient will increase 10 meter walk test to >1.34m/s as to improve gait speed for better community ambulation and to reduce fall risk.    Baseline 12/29: 0.37 m/s; 0.38 m/s; 2/10/08/2020 0.31 m/s 11/18/2020 0.34 m/s with SPC; 0.55m/s 12/30/20; 5/23: 0.45 m/s with SPC    Time 12    Period Weeks    Status On-going    Target Date 04/18/21      PT LONG TERM GOAL #5   Title Patient will increase FOTO score to equal to or greater than 64 to demonstrate statistically significant improvement in mobility and quality of life.     Baseline 12/29: 52; 10/14/2020 45; 10/28/2020 45; 11/18/2020 45; 12/30/20 44; 5/23: 51    Time 12    Period Weeks    Status On-going    Target Date 04/18/21      Additional Long Term Goals   Additional Long Term Goals Yes      PT LONG TERM GOAL #6   Title Patient will increase six minute walk test distance to >1000 for progression to community ambulator and improve gait ability    Baseline 6/6: 788 ft with [facility]    Time 10    Period Weeks    Target Date 04/18/21                   Plan - 03/18/21 0808     Clinical Impression Statement Pt able to ambulate up to 1.4 mph achieving RPE of 7/10 today. HR was monitored throughout where pt HR reached max of 107 bpm. Pt did exhibit a few sudden, brief "drops" in HR to 40s-50s, but reported no sx. Possible error with treadmill pulse reader, but pt also with similar readings on pulse-ox. PT also introduced cable machine exercises to pt for LE strengthening this session and pt was able to demo good technique following cuing. After all therex pt reports no sx. PT escorted pt to her car at end of session in transport chair. The pt will benefit from further skilled PT to continue to improve endurance, BLE strength and gait mechanics/ability to increase functional mobility and QOL.    Personal Factors and Comorbidities Comorbidity 3+;Time since onset of injury/illness/exacerbation    Comorbidities HYPOtension, paraparesis, SCI without spinal bone injury, DDD, cervical cancer, acute kidney injury, GERD, asthma, bradycardia, L total hip replacement, hypokalemia    Examination-Activity Limitations Squat;Lift;Stairs;Bend;Stand;Reach Overhead;Carry;Transfers    Examination-Participation Restrictions  Cleaning;Laundry;Meal Prep;Community Activity;Driving;Occupation    Stability/Clinical Decision Making Evolving/Moderate complexity    Rehab Potential Fair    PT Frequency 3x / week    PT Duration 12 weeks    PT Treatment/Interventions ADLs/Self Care Home  Management;Aquatic Therapy;Gait training;Stair training;Functional mobility training;Therapeutic activities;Therapeutic exercise;Balance training;Moist Heat;Manual techniques;Patient/family education;Neuromuscular re-education;Passive range of motion;Energy conservation;Vestibular;Joint Manipulations;Spinal Manipulations    PT Next Visit Plan endurance, dynamic and static balance,endurance, treadmill, leg press    PT Home Exercise Plan PROGRESS note: add new HEP, initiate UBE bike for cardiovasc endurance, R ankle motor control/strength, focus on STS and ambulation; added 11/18/2020 (handout) hip adduction, thoracic lumbar extension and sit<>stand with counter support; no changes    Consulted and Agree with Plan of Care Patient             Patient will benefit from skilled therapeutic intervention in order to improve the following deficits and impairments:  Abnormal gait, Dizziness, Improper body mechanics, Decreased mobility, Cardiopulmonary status limiting activity, Decreased activity tolerance, Decreased endurance, Decreased range of motion, Decreased strength, Decreased balance, Decreased safety awareness, Difficulty walking, Impaired flexibility, Obesity  Visit Diagnosis: Muscle weakness (generalized)  Other abnormalities of gait and mobility  Unsteadiness on feet  Other lack of coordination     Problem List Patient Active Problem List   Diagnosis Date Noted   Insomnia due to medical condition 02/25/2021   Myofascial muscle pain 02/25/2021   Nerve pain 10/18/2020   Incomplete paraplegia (HCC) 10/18/2020   Orthostatic hypotension 10/18/2020   Chronic migraine without aura without status migrainosus, not intractable    Hypokalemia    Adjustment reaction with anxiety and depression    Spinal cord injury, lumbar, without spinal bone injury, sequela ([place]) 08/18/2020   Paraparesis ([place])    Post-operative pain    Hypotension    Slow transit constipation    AKI (acute kidney  injury) ([place])    Right leg weakness 08/11/2020   DDD (degenerative disc disease), lumbar 09/02/2019    Class: Chronic   Degenerative disc disease, lumbar 09/02/2019   Chest tightness 09/23/2018   Bradycardia 09/23/2018   Labile blood pressure 03/08/2018   Chronic low back pain 09/03/2017   History of total hip replacement, left 01/26/2016   EDEMA 04/28/2008   LEG PAIN, BILATERAL 12/16/2007   CERVICAL CANCER 07/02/2007   MORBID OBESITY 07/02/2007   DEPRESSION 07/02/2007   COMMON MIGRAINE 07/02/2007   Allergic rhinitis 07/02/2007   ASTHMA 07/02/2007   GERD 07/02/2007   ELEVATED BLOOD PRESSURE WITHOUT DIAGNOSIS OF HYPERTENSION 07/02/2007   Ricard Dillon PT, DPT 03/18/2021, 8:14 AM  [place] [facility] MAIN [facility] SERVICES [address] [place], [place], [address] Phone: [phone]   Fax:  [phone]  Name: KARYSS FRESE MRN: [id number] Date of Birth: 14-Nov-1970

## 2021-03-22 ENCOUNTER — Other Ambulatory Visit: Payer: Self-pay

## 2021-03-22 ENCOUNTER — Ambulatory Visit: Payer: 59

## 2021-03-22 DIAGNOSIS — R2681 Unsteadiness on feet: Secondary | ICD-10-CM | POA: Diagnosis not present

## 2021-03-22 DIAGNOSIS — R2689 Other abnormalities of gait and mobility: Secondary | ICD-10-CM

## 2021-03-22 DIAGNOSIS — M6281 Muscle weakness (generalized): Secondary | ICD-10-CM

## 2021-03-22 NOTE — Therapy (Signed)
[place] [facility] SERVICES [address] [place], [place], [address] Phone: [phone]   Fax:  [phone]  Physical Therapy Treatment/ Physical Therapy Progress Note   Dates of reporting period  02/07/21   to   03/22/21   Patient Details  Name: Vanessa Medina MRN: [id number] Date of Birth: [birth year] Referring Provider (PT): Dr. Erling Cruz   Encounter Date: 03/22/2021   PT End of Session - 03/22/21 1641     Visit Number 50    Number of Visits 9    Date for PT Re-Evaluation 04/18/21    Authorization Type [employer] Other    Authorization Time Period 01/24/21-04/18/2021    PT Start Time 1644    PT Stop Time 1728    PT Time Calculation (min) 44 min    Equipment Utilized During Treatment Gait belt    Activity Tolerance Patient tolerated treatment well    Behavior During Therapy [facility] for tasks assessed/performed             Past Medical History:  Diagnosis Date   Anemia    Cervical cancer ([place])    Family history of adverse reaction to anesthesia     " my mother takes a long time time wake up"   GERD (gastroesophageal reflux disease)    Headache    migraine   History of shingles    Hypertension    Migraine    Multiple allergies    Obesity    Osteoarthritis    left hip   PONV (postoperative nausea and vomiting)    Sleep apnea    does not wear CPAP   Stroke ([place]) 08/11/2020   Wears glasses     Past Surgical History:  Procedure Laterality Date   ABDOMINAL HYSTERECTOMY     APPENDECTOMY     BILIOPANCREATIC DIVERSION     with duodenal switch laparoscopic    CHOLECYSTECTOMY     DIAGNOSTIC LAPAROSCOPY     LOA   ESOPHAGOGASTRODUODENOSCOPY (EGD) WITH PROPOFOL N/A 07/25/2017   Procedure: ESOPHAGOGASTRODUODENOSCOPY (EGD) WITH PROPOFOL;  Surgeon: Manya Silvas, MD;  Location: [facility] ENDOSCOPY;  Service: Endoscopy;  Laterality: N/A;   KNEE ARTHROSCOPY     LAPAROSCOPIC GASTRIC SLEEVE RESECTION WITH HIATAL HERNIA REPAIR      TONSILLECTOMY     TOTAL HIP ARTHROPLASTY Left 01/26/2016   Procedure: LEFT TOTAL HIP ARTHROPLASTY ANTERIOR APPROACH;  Surgeon: Leandrew Koyanagi, MD;  Location: [facility];  Service: Orthopedics;  Laterality: Left;   TRANSFORAMINAL LUMBAR INTERBODY FUSION (TLIF) WITH PEDICLE SCREW FIXATION 3 LEVEL  08/2019   Basil Dess, MD L2-L5    There were no vitals filed for this visit.   Subjective Assessment - 03/23/21 0708     Subjective Patient presents with wrap and large scratch on RLE due to injury from cat. Reports compliance with HEP. No falls or LOB.    Pertinent History 50 year old female with history of HTN, migraines, morbid obesity--BMI-41, chronic hypotension, multiple back surgeries with chronic pain with LLE lumbar radiculopathy who was scheduled to have spinal cord stimulator placed by Dr. Davy Pique on 08/11/2020 but unable to perform surgery due to scar tissue.  History taken from patient and chart review.  Post op on awakening, she had excruciating pain RLE with plegia and and allodynia. She was admitted for work up by Dr. Ronnald Ramp from surgical center on 08/11/2020. Thoracic MRI done revealing subtle increased T2/STIR intensity distal cord at T12-L1 level suspicious for edema/acute spinal cord injury, no  CSF leak as well as prior PLIF L2-L5 with residual foraminal protrusion L3/5 potentially irritating right L3 nerve. She was started on IV Dilaudid, gabapentin as well as IV Decadron for management of pain, headaches and severe neuropathy.  She continues to have pain in RLE but is having some motor return, has constant headache, decreased hearing in right> left ear, constipation with nausea as well as epigastric pain and weakness. Therapy ongoing and CIR recommended due to functional decline.  Of note, she reports 50 lbs weigh loss in the past 3-4 months--due to issues with N/V/intake. Has had two falls last month---no recall of incident leading to fall question due to syncope. Was in process of work up for renal  disease. She has had issue with LLE weakness with pain for the past year and being followed by Dr. Ernestina Patches. Did well with stimulator trial.    Currently in Pain? Yes    Pain Score 2     Pain Location Ankle    Pain Orientation Right    Pain Descriptors / Indicators Aching    Pain Type Acute pain    Pain Onset In the past 7 days    Pain Frequency Intermittent               Progress note GOALS:  5x STS: 23 seconds TUG: 20.4 seconds  DHI: 62 10 MWT: first trial 19.12 second trial 17.15 seconds =0.59 m/s with cane FOTO: 39%  6 min walk test : 620 ft with SPC; patient having discomfort in R ankle due to injury  Car transfer: able to perform with supervision and safe sequencing with SPC.   This is visit 28 of 60 approved visits    Patient's condition has the potential to improve in response to therapy. Maximum improvement is yet to be obtained. The anticipated improvement is attainable and reasonable in a generally predictable time.  Patient reports she is not yet where she would like to be and would like to be more independent.   decrease to 1x every other week due to reaching visit limit   Patient has improved her short distance ambulation and transfers as seen in 5xSTS, TUG and 10 MWT progress. Her prolonged ambulation is limited by ankle pain due to injury from cat. Patient only has 10 remaining approved visits from insurance for year so will decrease frequency to 1x/every other week at this time. Will retain cert as is however in case she is able to obtain additional visits. Patient's condition has the potential to improve in response to therapy. Maximum improvement is yet to be obtained. The anticipated improvement is attainable and reasonable in a generally predictable time. The pt will benefit from further skilled PT to continue to improve endurance, BLE strength and gait mechanics/ability to increase functional mobility and QOL.                PT Education -  03/22/21 1641     Education Details visit POC, goals    Person(s) Educated Patient    Methods Explanation;Demonstration;Tactile cues;Verbal cues    Comprehension Verbalized understanding;Returned demonstration;Verbal cues required;Tactile cues required              PT Short Term Goals - 03/22/21 1713       PT SHORT TERM GOAL #1   Title Pt will be independent with HEP in order to improve strength and balance in order to decrease fall risk and improve function at home and work.    Baseline 10/14/2020 on-going/deferred;  10/28/2020 ongoing pt has resumed HEP, reports doing what she can/HEP to be advanced; 11/18/2020  Pt has had trouble doing HEP d/t fatigue.; 5/23: Pt reports independence    Time 6    Period Weeks    Status Achieved    Target Date 01/24/21               PT Long Term Goals - 03/22/21 1713       PT LONG TERM GOAL #1   Title Patient (< 26 years old) will complete five times sit to stand test in < 10 seconds indicating an increased LE strength and improved balance.    Baseline 12/29: 38.45s; 10/14/2020 42.8 seconds; 10/28/2020 36 seconds; 11/18/2020 34.84 seconds; 12/30/20 41.55sec; 5/23: 27.2 sec 7/19: 23 seconds    Time 12    Period Weeks    Status Partially Met    Target Date 04/18/21      PT LONG TERM GOAL #2   Title Pt will decrease TUG to below 14 seconds/decrease in order to demonstrate decreased fall risk.    Baseline 12/29: 32.94s; 10/14/2020 43.12 sec; 10/28/2020 34.12 seconds; 11/18/2020 26.5 sec; 12/30/20: 31sec c SPC; 5/23: 24.6 with SPC 7/19: 20.4 seconds    Time 12    Period Weeks    Status Partially Met    Target Date 04/18/21      PT LONG TERM GOAL #3   Title Pt will decrease DHI score by at least 18 points in order to demonstrate clinically significant reduction in disability    Baseline 12/29: 30; 10/14/2020 62, indicating severe handicap; 10/28/2020 74' 5/23: 72 indicating severe handicap 7/20: 62   Not reassessed, but reports no significant  change/improvement with dizziness   Time 12    Period Weeks    Status Partially Met    Target Date 04/18/21      PT LONG TERM GOAL #4   Title Patient will increase 10 meter walk test to >1.60ms as to improve gait speed for better community ambulation and to reduce fall risk.    Baseline 12/29: 0.37 m/s; 0.38 m/s; 2/10/08/2020 0.31 m/s 11/18/2020 0.34 m/s with SPC; 0.214m 12/30/20; 5/23: [facility]45 [facility]: 0.59 m/s with cane    Time 12    Period Weeks    Status Partially Met    Target Date 04/18/21      PT LONG TERM GOAL #5   Title Patient will increase FOTO score to equal to or greater than 64 to demonstrate statistically significant improvement in mobility and quality of life.    Baseline 12/29: 52; 10/14/2020 45; 10/28/2020 45; 11/18/2020 45; 12/30/20 44; 5/23: 51 7/20: 39%    Time 12    Period Weeks    Status On-going    Target Date 04/18/21      PT LONG TERM GOAL #6   Title Patient will increase six minute walk test distance to >1000 for progression to community ambulator and improve gait ability    [facility] 6/6[facility]: 620 ft limited by pain    Time 10    Period Weeks    Status On-going    Target Date 04/18/21                   Plan - 03/23/21 0710     Clinical Impression Statement Patient has improved her short distance ambulation and transfers as seen in 5xSTS, TUG and 10 MWT progress. Her prolonged ambulation is limited by ankle pain  due to injury from cat. Patient only has 10 remaining approved visits from insurance for year so will decrease frequency to 1x/every other week at this time. Will retain cert as is however in case she is able to obtain additional visits. Patient's condition has the potential to improve in response to therapy. Maximum improvement is yet to be obtained. The anticipated improvement is attainable and reasonable in a generally predictable time. The pt will benefit from further skilled PT to continue to improve endurance, BLE  strength and gait mechanics/ability to increase functional mobility and QOL.    Personal Factors and Comorbidities Comorbidity 3+;Time since onset of injury/illness/exacerbation    Comorbidities HYPOtension, paraparesis, SCI without spinal bone injury, DDD, cervical cancer, acute kidney injury, GERD, asthma, bradycardia, L total hip replacement, hypokalemia    Examination-Activity Limitations Squat;Lift;Stairs;Bend;Stand;Reach Overhead;Carry;Transfers    Examination-Participation Restrictions Cleaning;Laundry;Meal Prep;Community Activity;Driving;Occupation    Stability/Clinical Decision Making Evolving/Moderate complexity    Rehab Potential Fair    PT Frequency 3x / week    PT Duration 12 weeks    PT Treatment/Interventions ADLs/Self Care Home Management;Aquatic Therapy;Gait training;Stair training;Functional mobility training;Therapeutic activities;Therapeutic exercise;Balance training;Moist Heat;Manual techniques;Patient/family education;Neuromuscular re-education;Passive range of motion;Energy conservation;Vestibular;Joint Manipulations;Spinal Manipulations    PT Next Visit Plan endurance, dynamic and static balance,endurance, treadmill, leg press    PT Home Exercise Plan PROGRESS note: add new HEP, initiate UBE bike for cardiovasc endurance, R ankle motor control/strength, focus on STS and ambulation; added 11/18/2020 (handout) hip adduction, thoracic lumbar extension and sit<>stand with counter support; no changes    Consulted and Agree with Plan of Care Patient             Patient will benefit from skilled therapeutic intervention in order to improve the following deficits and impairments:  Abnormal gait, Dizziness, Improper body mechanics, Decreased mobility, Cardiopulmonary status limiting activity, Decreased activity tolerance, Decreased endurance, Decreased range of motion, Decreased strength, Decreased balance, Decreased safety awareness, Difficulty walking, Impaired flexibility,  Obesity  Visit Diagnosis: Muscle weakness (generalized)  Other abnormalities of gait and mobility  Unsteadiness on feet     Problem List Patient Active Problem List   Diagnosis Date Noted   Insomnia due to medical condition 02/25/2021   Myofascial muscle pain 02/25/2021   Nerve pain 10/18/2020   Incomplete paraplegia (HCC) 10/18/2020   Orthostatic hypotension 10/18/2020   Chronic migraine without aura without status migrainosus, not intractable    Hypokalemia    Adjustment reaction with anxiety and depression    Spinal cord injury, lumbar, without spinal bone injury, sequela ([place]) 08/18/2020   Paraparesis ([place])    Post-operative pain    Hypotension    Slow transit constipation    AKI (acute kidney injury) ([place])    Right leg weakness 08/11/2020   DDD (degenerative disc disease), lumbar 09/02/2019    Class: Chronic   Degenerative disc disease, lumbar 09/02/2019   Chest tightness 09/23/2018   Bradycardia 09/23/2018   Labile blood pressure 03/08/2018   Chronic low back pain 09/03/2017   History of total hip replacement, left 01/26/2016   EDEMA 04/28/2008   LEG PAIN, BILATERAL 12/16/2007   CERVICAL CANCER 07/02/2007   MORBID OBESITY 07/02/2007   DEPRESSION 07/02/2007   COMMON MIGRAINE 07/02/2007   Allergic rhinitis 07/02/2007   ASTHMA 07/02/2007   GERD 07/02/2007   ELEVATED BLOOD PRESSURE WITHOUT DIAGNOSIS OF HYPERTENSION 07/02/2007    Janna Arch, PT, DPT  03/23/2021, 7:12 AM  [facility] [facility] [place],  [place], [address] Phone: [phone]   Fax:  [phone]  Name: Vanessa Medina MRN: [id number] Date of Birth: 10-Feb-1971

## 2021-03-24 ENCOUNTER — Ambulatory Visit: Payer: 59 | Admitting: Physical Therapy

## 2021-03-25 ENCOUNTER — Other Ambulatory Visit: Payer: Self-pay | Admitting: *Deleted

## 2021-03-25 MED ORDER — DULOXETINE HCL 30 MG PO CPEP
ORAL_CAPSULE | ORAL | 0 refills | Status: DC
Start: 2021-03-25 — End: 2021-09-12

## 2021-03-28 ENCOUNTER — Other Ambulatory Visit: Payer: Self-pay

## 2021-03-28 ENCOUNTER — Ambulatory Visit: Payer: 59

## 2021-03-28 DIAGNOSIS — R2681 Unsteadiness on feet: Secondary | ICD-10-CM | POA: Diagnosis not present

## 2021-03-28 DIAGNOSIS — R296 Repeated falls: Secondary | ICD-10-CM

## 2021-03-28 DIAGNOSIS — S34109S Unspecified injury to unspecified level of lumbar spinal cord, sequela: Secondary | ICD-10-CM

## 2021-03-28 DIAGNOSIS — M5441 Lumbago with sciatica, right side: Secondary | ICD-10-CM

## 2021-03-28 DIAGNOSIS — M545 Low back pain, unspecified: Secondary | ICD-10-CM

## 2021-03-28 DIAGNOSIS — R42 Dizziness and giddiness: Secondary | ICD-10-CM

## 2021-03-28 DIAGNOSIS — R278 Other lack of coordination: Secondary | ICD-10-CM

## 2021-03-28 DIAGNOSIS — R2689 Other abnormalities of gait and mobility: Secondary | ICD-10-CM

## 2021-03-28 DIAGNOSIS — M6281 Muscle weakness (generalized): Secondary | ICD-10-CM

## 2021-03-28 DIAGNOSIS — M5442 Lumbago with sciatica, left side: Secondary | ICD-10-CM

## 2021-03-28 DIAGNOSIS — R262 Difficulty in walking, not elsewhere classified: Secondary | ICD-10-CM

## 2021-03-28 NOTE — Therapy (Signed)
[place] MAIN [facility] SERVICES [place], [place], [address] Phone: [phone]   Fax:  [phone]  Physical Therapy Treatment  Patient Details  Name: Vanessa Medina MRN: [id number] Date of Birth: 1970-11-06 Referring Provider (PT): Dr. Erling Cruz   Encounter Date: 03/28/2021   PT End of Session - 03/28/21 1213     Visit Number [id number]    Number of Visits 43    Date for PT Re-Evaluation 04/18/21    Authorization Type U[employer] Other    Authorization Time Period 01/24/21-04/18/2021    PT Start Time 1146    PT Stop Time 1125    PT Time Calculation (min) 1419 min    Equipment Utilized During Treatment Gait belt    Activity Tolerance Patient tolerated treatment well    Behavior During Therapy [facility] for tasks assessed/performed             Past Medical History:  Diagnosis Date   Anemia    Cervical cancer ([place])    Family history of adverse reaction to anesthesia     " my mother takes a long time time wake up"   GERD (gastroesophageal reflux disease)    Headache    migraine   History of shingles    Hypertension    Migraine    Multiple allergies    Obesity    Osteoarthritis    left hip   PONV (postoperative nausea and vomiting)    Sleep apnea    does not wear CPAP   Stroke ([place]) 08/11/2020   Wears glasses     Past Surgical History:  Procedure Laterality Date   ABDOMINAL HYSTERECTOMY     APPENDECTOMY     BILIOPANCREATIC DIVERSION     with duodenal switch laparoscopic    CHOLECYSTECTOMY     DIAGNOSTIC LAPAROSCOPY     LOA   ESOPHAGOGASTRODUODENOSCOPY (EGD) WITH PROPOFOL N/A 07/25/2017   Procedure: ESOPHAGOGASTRODUODENOSCOPY (EGD) WITH PROPOFOL;  Surgeon: Manya Silvas, MD;  Location: [facility] ENDOSCOPY;  Service: Endoscopy;  Laterality: N/A;   KNEE ARTHROSCOPY     LAPAROSCOPIC GASTRIC SLEEVE RESECTION WITH HIATAL HERNIA REPAIR     TONSILLECTOMY     TOTAL HIP ARTHROPLASTY Left 01/26/2016   Procedure: LEFT TOTAL HIP  ARTHROPLASTY ANTERIOR APPROACH;  Surgeon: Leandrew Koyanagi, MD;  Location: [facility];  Service: Orthopedics;  Laterality: Left;   TRANSFORAMINAL LUMBAR INTERBODY FUSION (TLIF) WITH PEDICLE SCREW FIXATION 3 LEVEL  08/2019   Basil Dess, MD L2-L5    There were no vitals filed for this visit.   Subjective Assessment - 03/28/21 1158     Subjective Pt denies any dizziness today, HEP going fairly well, but slow. Pt concerned about some recent increased calf pain on right, asking if she might has a blood clot in her leg.    Pertinent History 50 year old female with history of HTN, migraines, morbid obesity--BMI-41, chronic hypotension, multiple back surgeries with chronic pain with LLE lumbar radiculopathy who was scheduled to have spinal cord stimulator placed by Dr. Davy Pique on 08/11/2020 but unable to perform surgery due to scar tissue.  History taken from patient and chart review.  Post op on awakening, she had excruciating pain RLE with plegia and and allodynia. She was admitted for work up by Dr. Ronnald Ramp from surgical center on 08/11/2020. Thoracic MRI done revealing subtle increased T2/STIR intensity distal cord at T12-L1 level suspicious for edema/acute spinal cord injury, no CSF leak as well as prior PLIF L2-L5 with  residual foraminal protrusion L3/5 potentially irritating right L3 nerve. She was started on IV Dilaudid, gabapentin as well as IV Decadron for management of pain, headaches and severe neuropathy.  She continues to have pain in RLE but is having some motor return, has constant headache, decreased hearing in right> left ear, constipation with nausea as well as epigastric pain and weakness. Therapy ongoing and CIR recommended due to functional decline.  Of note, she reports 50 lbs weigh loss in the past 3-4 months--due to issues with N/V/intake. Has had two falls last month---no recall of incident leading to fall question due to syncope. Was in process of work up for renal disease. She has had issue with  LLE weakness with pain for the past year and being followed by Dr. Ernestina Patches. Did well with stimulator trial.    Currently in Pain? Yes    Pain Score 7     Pain Location Calf    Pain Orientation Right    Pain Descriptors / Indicators Aching              INTERVENTION THIS DATE: *pt education regarding signs/symptoms of DVT, when to call PCP v go to ED  *visual inspection, no areas of redness, positive tenderness at central posterior gastroc area, no focal tenderness to lateral/medial soleus/gastroc, no focal tenderness to achilles complex, no ecchymosis or considerable bruising Pt has 2.5cm greater diameter on the Right calf at tibial tuberosity level (47.5cm v 45 cm Left, equal diameters at the level 10cm distal the tibial tuberosity (50cm)   -AMB on treadmill with progressive speed increase 8 minutes, BUE support  (Started at 0.5 mph, increasing each 60 seconds, topped at 1.52mh) -Seated Heel raises 1x15 bilat, STS from chair, 2x10 BUE support prn -STS from chair 2x10 on treadmill, hands ad lib       PT Short Term Goals - 03/22/21 1713       PT SHORT TERM GOAL #1   Title Pt will be independent with HEP in order to improve strength and balance in order to decrease fall risk and improve function at home and work.    Baseline 10/14/2020 on-going/deferred; 10/28/2020 ongoing pt has resumed HEP, reports doing what she can/HEP to be advanced; 11/18/2020  Pt has had trouble doing HEP d/t fatigue.; 5/23: Pt reports independence    Time 6    Period Weeks    Status Achieved    Target Date 01/24/21               PT Long Term Goals - 03/22/21 1713       PT LONG TERM GOAL #1   Title Patient (< 678years old) will complete five times sit to stand test in < 10 seconds indicating an increased LE strength and improved balance.    Baseline 12/29: 38.45s; 10/14/2020 42.8 seconds; 10/28/2020 36 seconds; 11/18/2020 34.84 seconds; 12/30/20 41.55sec; 5/23: 27.2 sec 7/19: 23 seconds    Time 12     Period Weeks    Status Partially Met    Target Date 04/18/21      PT LONG TERM GOAL #2   Title Pt will decrease TUG to below 14 seconds/decrease in order to demonstrate decreased fall risk.    Baseline 12/29: 32.94s; 10/14/2020 43.12 sec; 10/28/2020 34.12 seconds; 11/18/2020 26.5 sec; 12/30/20: 31sec c SPC; 5/23: 24.6 with SPC 7/19: 20.4 seconds    Time 12    Period Weeks    Status Partially Met    Target Date 04/18/21  PT LONG TERM GOAL #3   Title Pt will decrease DHI score by at least 18 points in order to demonstrate clinically significant reduction in disability    Baseline 12/29: 30; 10/14/2020 62, indicating severe handicap; 10/28/2020 74' 5/23: 72 indicating severe handicap 7/20: 62   Not reassessed, but reports no significant change/improvement with dizziness   Time 12    Period Weeks    Status Partially Met    Target Date 04/18/21      PT LONG TERM GOAL #4   Title Patient will increase 10 meter walk test to >1.54ms as to improve gait speed for better community ambulation and to reduce fall risk.    Baseline 12/29: 0.37 m/s; 0.38 m/s; 2/10/08/2020 0.31 m/s 11/18/2020 0.34 m/s with SPC; 0.257m 12/30/20; 5[facility]23[facility]: 0.59 m/s with cane    Time 12    Period Weeks    Status Partially Met    Target Date 04/18/21      PT LONG TERM GOAL #5   Title Patient will increase FOTO score to equal to or greater than 64 to demonstrate statistically significant improvement in mobility and quality of life.    Baseline 12/29: 52; 10/14/2020 45; 10/28/2020 45; 11/18/2020 45; 12/30/20 44; 5/23: 51 7/20: 39%    Time 12    Period Weeks    Status On-going    Target Date 04/18/21      PT LONG TERM GOAL #6   Title Patient will increase six minute walk test distance to >1000 for progression to community ambulator and improve gait ability    Baseline 6/6: 7[facility]: 620 ft limited by pain    Time 10    Period Weeks    Status On-going    Target Date 04/18/21                    Plan - 03/28/21 1214     Clinical Impression Statement  Continued with POC as able. Pt has new pain in calf that limits tolerance in actiivty- no frank signs of DVT, but other etiology no entirely clear, hence given the circumferential difference in symptomatic leg, pt encouraged to contact PCP for guidance. Pt able to advance AMB on treadmill to 8 minutes. Pt continue to make progress toward LT treatment goals, but remains limited in strength, activity tolerance, and ambulatory balance.    Personal Factors and Comorbidities Comorbidity 3+;Time since onset of injury/illness/exacerbation    Comorbidities HYPOtension, paraparesis, SCI without spinal bone injury, DDD, cervical cancer, acute kidney injury, GERD, asthma, bradycardia, L total hip replacement, hypokalemia    Examination-Activity Limitations Squat;Lift;Stairs;Bend;Stand;Reach Overhead;Carry;Transfers    Examination-Participation Restrictions Cleaning;Laundry;Meal Prep;Community Activity;Driving;Occupation    Stability/Clinical Decision Making Evolving/Moderate complexity    Clinical Decision Making Moderate    Rehab Potential Fair    PT Frequency 3x / week    PT Treatment/Interventions ADLs/Self Care Home Management;Aquatic Therapy;Gait training;Stair training;Functional mobility training;Therapeutic activities;Therapeutic exercise;Balance training;Moist Heat;Manual techniques;Patient/family education;Neuromuscular re-education;Passive range of motion;Energy conservation;Vestibular;Joint Manipulations;Spinal Manipulations    PT Next Visit Plan endurance, dynamic and static balance,endurance, treadmill, leg press    PT Home Exercise Plan PROGRESS note: add new HEP, initiate UBE bike for cardiovasc endurance, R ankle motor control/strength, focus on STS and ambulation; added 11/18/2020 (handout) hip adduction, thoracic lumbar extension and sit<>stand with counter support; no changes    Consulted and Agree with Plan of Care  Patient  Patient will benefit from skilled therapeutic intervention in order to improve the following deficits and impairments:  Abnormal gait, Dizziness, Improper body mechanics, Decreased mobility, Cardiopulmonary status limiting activity, Decreased activity tolerance, Decreased endurance, Decreased range of motion, Decreased strength, Decreased balance, Decreased safety awareness, Difficulty walking, Impaired flexibility, Obesity  Visit Diagnosis: Muscle weakness (generalized)  Other abnormalities of gait and mobility  Unsteadiness on feet  Other lack of coordination  Dizziness and giddiness  Acute bilateral low back pain with right-sided sciatica  Chronic midline low back pain, unspecified whether sciatica present  Acute left-sided low back pain with left-sided sciatica  Repeated falls  Spinal cord injury, lumbar, without spinal bone injury, sequela (HCC)  Difficulty in walking, not elsewhere classified     Problem List Patient Active Problem List   Diagnosis Date Noted   Insomnia due to medical condition 02/25/2021   Myofascial muscle pain 02/25/2021   Nerve pain 10/18/2020   Incomplete paraplegia (HCC) 10/18/2020   Orthostatic hypotension 10/18/2020   Chronic migraine without aura without status migrainosus, not intractable    Hypokalemia    Adjustment reaction with anxiety and depression    Spinal cord injury, lumbar, without spinal bone injury, sequela ([place]) 08/18/2020   Paraparesis (HCC)    Post-operative pain    Hypotension    Slow transit constipation    AKI (acute kidney injury) ([place])    Right leg weakness 08/11/2020   DDD (degenerative disc disease), lumbar 09/02/2019    Class: Chronic   Degenerative disc disease, lumbar 09/02/2019   Chest tightness 09/23/2018   Bradycardia 09/23/2018   Labile blood pressure 03/08/2018   Chronic low back pain 09/03/2017   History of total hip replacement, left 01/26/2016   EDEMA 04/28/2008   LEG  PAIN, BILATERAL 12/16/2007   CERVICAL CANCER 07/02/2007   MORBID OBESITY 07/02/2007   DEPRESSION 07/02/2007   COMMON MIGRAINE 07/02/2007   Allergic rhinitis 07/02/2007   ASTHMA 07/02/2007   GERD 07/02/2007   ELEVATED BLOOD PRESSURE WITHOUT DIAGNOSIS OF HYPERTENSION 07/02/2007   12:42 PM, 03/28/21 Vanessa Medina, PT, DPT Physical Therapist - [place] [phone]     Vanessa Medina 03/28/2021, 12:18 PM  [place] MAIN [facility] SERVICES [address] [place], [place], [address] Phone: [phone]   Fax:  [phone]  Name: Vanessa Medina MRN: [id number] Date of Birth: 30-Dec-1970

## 2021-03-29 ENCOUNTER — Other Ambulatory Visit: Payer: Self-pay | Admitting: Specialist

## 2021-03-30 ENCOUNTER — Ambulatory Visit: Payer: 59

## 2021-04-04 ENCOUNTER — Ambulatory Visit: Payer: 59 | Admitting: Physical Therapy

## 2021-04-06 ENCOUNTER — Encounter: Payer: Self-pay | Admitting: Allergy

## 2021-04-06 ENCOUNTER — Ambulatory Visit (INDEPENDENT_AMBULATORY_CARE_PROVIDER_SITE_OTHER): Payer: 59 | Admitting: Allergy

## 2021-04-06 ENCOUNTER — Other Ambulatory Visit: Payer: Self-pay

## 2021-04-06 VITALS — BP 118/70 | HR 84 | Temp 98.1°F | Resp 16 | Ht 65.0 in | Wt 250.8 lb

## 2021-04-06 DIAGNOSIS — H938X3 Other specified disorders of ear, bilateral: Secondary | ICD-10-CM | POA: Diagnosis not present

## 2021-04-06 DIAGNOSIS — Z889 Allergy status to unspecified drugs, medicaments and biological substances status: Secondary | ICD-10-CM | POA: Diagnosis not present

## 2021-04-06 DIAGNOSIS — J3089 Other allergic rhinitis: Secondary | ICD-10-CM

## 2021-04-06 DIAGNOSIS — T781XXD Other adverse food reactions, not elsewhere classified, subsequent encounter: Secondary | ICD-10-CM | POA: Insufficient documentation

## 2021-04-06 NOTE — Patient Instructions (Addendum)
Today's skin testing showed: Positive to one mold and borderline to tree pollen. Results given.  Environmental allergies Start environmental control measures as below. Use over the counter antihistamines such as Zyrtec (cetirizine), Claritin (loratadine), Allegra (fexofenadine), or Xyzal (levocetirizine) daily as needed. May take twice a day during allergy flares. May switch antihistamines every few months. Continue Singulair (montelukast) 10mg  daily at night.  Use Flonase (fluticasone) nasal spray 1 spray per nostril twice a day as needed for nasal congestion.  Use azelastine nasal spray 1-2 sprays per nostril twice a day as needed for runny nose/drainage. Nasal saline spray (i.e., Simply Saline) or nasal saline lavage (i.e., NeilMed) is recommended as needed and prior to medicated nasal sprays.  Get bloodwork:  We are ordering labs, so please allow 1-2 weeks for the results to come back. With the newly implemented Cures Act, the labs might be visible to you at the same time that they become visible to me. However, I will not address the results until all of the results are back, so please be patient.   You may try taking some over the counter decongestants for 3-5 days and see if that helps with your symptoms.   Abnormal ear noises: Follow up with ENT as scheduled.  See handout on tinnitus.  Follow up in 6 months or sooner if needed.   Reducing Pollen Exposure Pollen seasons: trees (spring), grass (summer) and ragweed/weeds (fall). Keep windows closed in your home and car to lower pollen exposure.  Install air conditioning in the bedroom and throughout the house if possible.  Avoid going out in dry windy days - especially early morning. Pollen counts are highest between 5 - 10 AM and on dry, hot and windy days.  Save outside activities for late afternoon or after a heavy rain, when pollen levels are lower.  Avoid mowing of grass if you have grass pollen allergy. Be aware that  pollen can also be transported indoors on people and pets.  Dry your clothes in an automatic dryer rather than hanging them outside where they might collect pollen.  Rinse hair and eyes before bedtime.  Mold Control Mold and fungi can grow on a variety of surfaces provided certain temperature and moisture conditions exist.  Outdoor molds grow on plants, decaying vegetation and soil. The major outdoor mold, Alternaria and Cladosporium, are found in very high numbers during hot and dry conditions. Generally, a late summer - fall peak is seen for common outdoor fungal spores. Rain will temporarily lower outdoor mold spore count, but counts rise rapidly when the rainy period ends. The most important indoor molds are Aspergillus and Penicillium. Dark, humid and poorly ventilated basements are ideal sites for mold growth. The next most common sites of mold growth are the bathroom and the kitchen. Outdoor (Seasonal) Mold Control Use air conditioning and keep windows closed. Avoid exposure to decaying vegetation. Avoid leaf raking. Avoid grain handling. Consider wearing a face mask if working in moldy areas.  Indoor (Perennial) Mold Control  Maintain humidity below 50%. Get rid of mold growth on hard surfaces with water, detergent and, if necessary, 5% bleach (do not mix with other cleaners). Then dry the area completely. If mold covers an area more than 10 square feet, consider hiring an indoor environmental professional. For clothing, washing with soap and water is best. If moldy items cannot be cleaned and dried, throw them away. Remove sources e.g. contaminated carpets. Repair and seal leaking roofs or pipes. Using dehumidifiers in damp basements may  be helpful, but empty the water and clean units regularly to prevent mildew from forming. All rooms, especially basements, bathrooms and kitchens, require ventilation and cleaning to deter mold and mildew growth. Avoid carpeting on concrete or damp  floors, and storing items in damp areas.

## 2021-04-06 NOTE — Progress Notes (Signed)
New Patient Note  [name] MRN: [id number] DOB: 12-19-1970 Date of Office Visit: 04/06/2021  Consult requested by: Jason Coop, DO Primary care provider: [place] Pink, MD  Chief Complaint: Allergic Rhinitis  (Says she has had allergies for years./December 2021 spinal cord injury and stroke. Says bad ear issues. Hummingbird sounds in her ears. As well as vertigo. ENT stated she could not help.)  History of Present Illness: I had the pleasure of seeing Vanessa Medina for initial evaluation at the [facility] on 04/06/2021. She is a 50 y.o. female, who is referred here by Dr. Fredric Dine (ENT) for the evaluation of allergic rhinitis.  Patient had procedure last year and had complications with spinal cord injury and stroke. Since then she developed a "hummingbird fluttering" in her ears for the past 7 months. She also has vertigo as well. Concerned if allergies are making her symptoms worse.   She reports symptoms of nasal congestion, rhinorrhea, sneezing, itchy/watery eyes. Symptoms have been going on for many years. The symptoms are present all year around. Anosmia: diminished sense of smell. Headache: yes. She has used Flonase 1 spray BID, Astelin 1 spray BID, montelukast, Claritin with minimal improvement in symptoms. Sinus infections: no. Previous work up includes: skin testing many years ago which was positive to cats, dogs, cockroaches, dust mites, mold, tree pollen per patient report. Patient started AIT but was only on it for a few months due to cost.   Previous ENT evaluation: yes and no prior sinus surgery. History of nasal polyps: no. Last eye exam: April 2022. History of reflux: no.  Patient follows with neurology as well.   01/04/2021 ENT visit: "Thurley Medina is a 50 y.o. female with history of hypertension, allergic rhinitis, migraines, spinal cord injury after attempted spinal cord stimulator placement in December with perceived sensation  of fluttering in her ears bilaterally since that time, subjective hearing loss, and dizziness. Audiogram performed 11/23/2020 demonstrated normal hearing bilaterally with type A tympanograms. Physical examination today is unremarkable; clear mucoid nasal drainage and turbinate hypertrophy consistent with allergic rhinitis is again noted, no evidence of polyposis or purulent drainage on anterior rhinoscopy. Bilateral ears are healthy with no evidence of acute or chronic infectious process. Patient was reassured. Her dizziness has improved since starting vestibular therapy. She expresses significant frustration with her uncontrolled allergy symptoms despite daily use of Flonase. We discussed referral to allergy and immunology for retesting, as patient's last allergy test was performed about 6 years ago. We also discussed obtaining a CT of her sinuses to ensure that undiagnosed sinusitis is not contributing to her symptoms. Patient would like to proceed with referral to allergy and immunology and hold off on any imaging at this time. Referral to allergy was sent today. Prescription for Astelin nasal spray also sent, advised patient to use this in conjunction with her Flonase as it may improve her rhinorrhea. She will continue with her daily antihistamine. We will defer trialing Singulair to allergist. She will follow-up with me in 6 months or sooner if needed."  Assessment and Plan: Royalty is a 50 y.o. female with: Other allergic rhinitis Perennial rhinoconjunctivitis symptoms for many years.  Currently on Flonase, Astelin, montelukast and Claritin with minimal benefit.  Skin testing many years ago showed multiple positives per patient report and was on AIT for a few months.  Today's main concern is her newly developed abnormal noise in her ears for the past 7 months along with vertigo.  Concerned if environmental allergies may be contributing to her symptoms. Today's skin prick testing showed: Positive to one  mold and borderline to tree pollen. Will get bloodwork instead of intradermal testing and will make additional recommendations based on results. I doubt this is causing her ear symptoms.  Start environmental control measures as below. Use over the counter antihistamines such as Zyrtec (cetirizine), Claritin (loratadine), Allegra (fexofenadine), or Xyzal (levocetirizine) daily as needed. May take twice a day during allergy flares. May switch antihistamines every few months. Continue Singulair (montelukast) 10mg  daily at night. Use Flonase (fluticasone) nasal spray 1 spray per nostril twice a day as needed for nasal congestion.  Use azelastine nasal spray 1-2 sprays per nostril twice a day as needed for runny nose/drainage. Nasal saline spray (i.e., Simply Saline) or nasal saline lavage (i.e., NeilMed) is recommended as needed and prior to medicated nasal sprays. Try over the counter decongestants for 3-5 days and see if that helps with the symptoms.   Abnormal sensation in both ears Discussed with patient that the above environmental allergens are not likely causing her symptoms with the ears. Follow up with ENT as scheduled.  See handout on tinnitus.  Other adverse food reactions, not elsewhere classified, subsequent encounter Shellfish caused throat swelling in the past. Also avoiding fish. Continue to avoid seafood. For mild symptoms you can take over the counter antihistamines such as Benadryl and monitor symptoms closely. If symptoms worsen or if you have severe symptoms including breathing issues, throat closure, significant swelling, whole body hives, severe diarrhea and vomiting, lightheadedness then inject epinephrine and seek immediate medical care afterwards. Consider testing in the future as this was not patient's main complaint for today's visit.  Multiple drug allergies Continue to avoid medications on allergy list.   Return in about 6 months (around 10/07/2021).  No orders of  the defined types were placed in this encounter.  Lab Orders  Allergens w/Total IgE Area 2    Other allergy screening: Asthma: no Used albuterol in the past but not recently.  Food allergy: yes Shellfish caused throat swelling long time ago. Has Epipen on hand if needed. Currently avoiding fish as well.   Medication allergy: yes Hymenoptera allergy: no Large localized reactions after recent sting. Urticaria: no Eczema:no History of recurrent infections suggestive of immunodeficency: no  Diagnostics: Skin Testing: Environmental allergy panel. Positive to one mold and borderline to tree pollen. Results discussed with patient/family.  Airborne Adult Perc - 04/06/21 1408     Time Antigen Placed 1408    Allergen Manufacturer Lavella Hammock    Location Back    Number of Test 59    1. Control-Buffer 50% Glycerol Negative    2. Control-Histamine 1 mg/ml 2+    3. Albumin saline Negative    4. [place] Negative    5. [place] Negative    6. Johnson Negative    7. [place] Blue Negative    8. Meadow Fescue Negative    9. Perennial Rye Negative    10. Sweet Vernal Negative    11. Timothy Negative    12. Cocklebur Negative    13. Burweed Marshelder Negative    14. Ragweed, short Negative    15. Ragweed, Giant Negative    16. Plantain,  English Negative    17. Lamb's Quarters Negative    18. Sheep Sorrell Negative    19. Rough Pigweed Negative    20. Marsh Elder, Rough Negative    21. Mugwort, Common Negative    22. Lia Foyer  mix Negative    23. Birch mix Negative    24. Beech American Negative    25. Box, Elder Negative    26. Cedar, red Negative    27. Cottonwood, [place] Negative    28. Elm mix Negative    29. Hickory --   +/-   30. Maple mix Negative    31. Oak, [place] mix Negative    32. Pecan Pollen Negative    33. Pine mix Negative    34. Sycamore Eastern Negative    35. Cacao, Black Pollen Negative    36. Alternaria alternata Negative    37. Cladosporium Herbarum Negative     38. Aspergillus mix Negative    39. Penicillium mix Negative    40. Bipolaris sorokiniana (Helminthosporium) Negative    41. Drechslera spicifera (Curvularia) Negative    42. Mucor plumbeus Negative    43. Fusarium moniliforme 2+    44. Aureobasidium pullulans (pullulara) Negative    45. Rhizopus oryzae Negative    46. Botrytis cinera Negative    47. Epicoccum nigrum Negative    48. Phoma betae Negative    49. Candida Albicans Negative    50. Trichophyton mentagrophytes Negative    51. Mite, D Farinae  5,000 AU/ml Negative    52. Mite, D Pteronyssinus  5,000 AU/ml Negative    53. Cat Hair 10,000 BAU/ml Negative    54.  Dog Epithelia Negative    55. Mixed Feathers Negative    56. Horse Epithelia Negative    57. Cockroach, German Negative    58. Mouse Negative    59. Tobacco Leaf Negative             Past Medical History: Patient Active Problem List   Diagnosis Date Noted   Abnormal sensation in both ears 04/06/2021   Other adverse food reactions, not elsewhere classified, subsequent encounter 04/06/2021   Multiple drug allergies 04/06/2021   Insomnia due to medical condition 02/25/2021   Myofascial muscle pain 02/25/2021   Nerve pain 10/18/2020   Incomplete paraplegia (HCC) 10/18/2020   Orthostatic hypotension 10/18/2020   Chronic migraine without aura without status migrainosus, not intractable    Hypokalemia    Adjustment reaction with anxiety and depression    Spinal cord injury, lumbar, without spinal bone injury, sequela ([place]) 08/18/2020   Paraparesis ([place])    Post-operative pain    Hypotension    Slow transit constipation    AKI (acute kidney injury) ([place])    Right leg weakness 08/11/2020   DDD (degenerative disc disease), lumbar 09/02/2019    Class: Chronic   Degenerative disc disease, lumbar 09/02/2019   Chest tightness 09/23/2018   Bradycardia 09/23/2018   Labile blood pressure 03/08/2018   Chronic low back pain 09/03/2017   History of total hip  replacement, left 01/26/2016   EDEMA 04/28/2008   LEG PAIN, BILATERAL 12/16/2007   CERVICAL CANCER 07/02/2007   MORBID OBESITY 07/02/2007   DEPRESSION 07/02/2007   COMMON MIGRAINE 07/02/2007   Other allergic rhinitis 07/02/2007   ASTHMA 07/02/2007   GERD 07/02/2007   ELEVATED BLOOD PRESSURE WITHOUT DIAGNOSIS OF HYPERTENSION 07/02/2007   Past Medical History:  Diagnosis Date   Anemia    Cervical cancer ([place])    Family history of adverse reaction to anesthesia     " my mother takes a long time time wake up"   GERD (gastroesophageal reflux disease)    Headache    migraine   History of shingles    Hypertension  Migraine    Multiple allergies    Obesity    Osteoarthritis    left hip   PONV (postoperative nausea and vomiting)    Sleep apnea    does not wear CPAP   Stroke ([place]) 08/11/2020   Wears glasses    Past Surgical History: Past Surgical History:  Procedure Laterality Date   ABDOMINAL HYSTERECTOMY     APPENDECTOMY     BILIOPANCREATIC DIVERSION     with duodenal switch laparoscopic    CHOLECYSTECTOMY     DIAGNOSTIC LAPAROSCOPY     LOA   ESOPHAGOGASTRODUODENOSCOPY (EGD) WITH PROPOFOL N/A 07/25/2017   Procedure: ESOPHAGOGASTRODUODENOSCOPY (EGD) WITH PROPOFOL;  Surgeon: Manya Silvas, MD;  Location: [facility] ENDOSCOPY;  Service: Endoscopy;  Laterality: N/A;   KNEE ARTHROSCOPY     LAPAROSCOPIC GASTRIC SLEEVE RESECTION WITH HIATAL HERNIA REPAIR     TONSILLECTOMY     TOTAL HIP ARTHROPLASTY Left 01/26/2016   Procedure: LEFT TOTAL HIP ARTHROPLASTY ANTERIOR APPROACH;  Surgeon: Leandrew Koyanagi, MD;  Location: [facility];  Service: Orthopedics;  Laterality: Left;   TRANSFORAMINAL LUMBAR INTERBODY FUSION (TLIF) WITH PEDICLE SCREW FIXATION 3 LEVEL  08/2019   Basil Dess, MD L2-L5   Medication List:  Current Outpatient Medications  Medication Sig Dispense Refill   aspirin EC 81 MG tablet Take 81 mg by mouth daily. Swallow whole.     azelastine (ASTELIN) 0.1 % nasal spray Place into  both nostrils 2 (two) times daily. Use in each nostril as directed     baclofen (LIORESAL) 10 MG tablet Take 1 tablet (10 mg total) by mouth 3 (three) times daily. Take BID but has 3rd dose to take IF NEEDED- for spasms 90 tablet 5   diclofenac Sodium (VOLTAREN) 1 % GEL Apply 4 g topically 4 (four) times daily. 350 g 3   DULoxetine (CYMBALTA) 30 MG capsule Take 1 capsule by mouth at bedtime for 1 week then 2 capsules at bedtime thereafter 180 capsule 0   fludrocortisone (FLORINEF) 0.1 MG tablet Take 0.1 mg by mouth 2 (two) times daily.     fluticasone (FLONASE) 50 MCG/ACT nasal spray Place 1-2 sprays into both nostrils daily.     gabapentin (NEURONTIN) 400 MG capsule TAKE 3 CAPSULES(1200 MG) BY MOUTH AT BEDTIME 90 capsule 5   Lifitegrast 5 % SOLN Apply to eye.     midodrine (PROAMATINE) 10 MG tablet Take 1 tablet (10 mg total) by mouth 3 (three) times daily. While awake. Can take one additional if dizziness 270 tablet 3   montelukast (SINGULAIR) 10 MG tablet Take 1 tablet (10 mg total) by mouth at bedtime. For allergies 30 tablet 5   Multiple Vitamin (MULTIVITAMIN WITH MINERALS) TABS tablet Take 1 tablet by mouth 2 (two) times daily.     pramipexole (MIRAPEX) 0.125 MG tablet Take 0.5 mg by mouth at bedtime.     pyridOXINE (VITAMIN B-6) 100 MG tablet Take 1 tablet (100 mg total) by mouth daily. 90 tablet 1   Thiamine HCl (VITAMIN B-1) 250 MG tablet Take 1 tablet (250 mg total) by mouth daily. 90 tablet 1   topiramate (TOPAMAX) 100 MG tablet TAKE 1 TABLET(100 MG) BY MOUTH AT BEDTIME 90 tablet 5   traZODone (DESYREL) 50 MG tablet Take 0.5-1 tablets (25-50 mg total) by mouth at bedtime as needed for sleep. 15 tablet 0   vitamin B-12 (CYANOCOBALAMIN) 1000 MCG tablet Take 1 tablet (1,000 mcg total) by mouth daily. 90 tablet 1   No current facility-administered medications  for this visit.   Allergies: Allergies  Allergen Reactions   Shellfish Allergy Anaphylaxis   Meperidine Hcl Other (See Comments)     BRADYCARDIA   Morphine Other (See Comments)    BRADYCARDIA   Acetaminophen Itching   Bee Pollen Other (See Comments)    Unknown   Ivp Dye [Iodinated Diagnostic Agents] Other (See Comments)    Swelling    Oxycodone Hives and Itching    Blisters on back   Pentazocine Nausea Only and Nausea And Vomiting   Codeine Nausea And Vomiting   Oxycodone-Acetaminophen Itching   Pentazocine Lactate Nausea And Vomiting    REACTION: vomiting with Talwin NX   Propoxyphene Nausea Only and Nausea And Vomiting   Propoxyphene N-Acetaminophen Nausea And Vomiting   Social History: Social History   Socioeconomic History   Marital status: Divorced    Spouse name: Not on file   Number of children: 1   Years of education: Not on file   Highest education level: Not on file  Occupational History   Not on file  Tobacco Use   Smoking status: Never   Smokeless tobacco: Never  Vaping Use   Vaping Use: Never used  Substance and Sexual Activity   Alcohol use: Yes    Comment: occasional wine   Drug use: No   Sexual activity: Not Currently  Other Topics Concern   Not on file  Social History Narrative   Right handed    Uses cane to walk    Social Determinants of Health   Financial Resource Strain: Not on file  Food Insecurity: Not on file  Transportation Needs: Not on file  Physical Activity: Not on file  Stress: Not on file  Social Connections: Not on file   Lives in a 51 year old house. Smoking: denies Occupation: accounting in [place] History: Water Damage/mildew in the house: no Charity fundraiser in the family room: no Carpet in the bedroom: yes Heating: gas Cooling: central Pet: yes 1 cat  x 15 yrs  Family History: Family History  Problem Relation Age of Onset   Renal Disease Mother    Hypertension Mother    Sudden Cardiac Death Mother    Heart failure Mother    Valvular heart disease Mother    Heart disease Mother    Stroke Brother    Heart attack Brother 74    Diabetes Other    Breast cancer Cousin        paternal side   Problem                               Relation Asthma                                   No  Eczema                                No  Food allergy                          No  Allergic rhino conjunctivitis     No  Review of Systems  Constitutional:  Negative for appetite change, chills, fever and unexpected weight change.  HENT:  Positive for congestion, rhinorrhea, sneezing, tinnitus and voice change.   Eyes:  Negative for itching.  Respiratory:  Negative for cough, chest tightness, shortness of breath and wheezing.   Cardiovascular:  Negative for chest pain.  Gastrointestinal:  Negative for abdominal pain.  Genitourinary:  Negative for difficulty urinating.  Skin:  Negative for rash.  Allergic/Immunologic: Positive for environmental allergies.  Neurological:  Positive for headaches.   Objective: BP 118/70   Pulse 84   Temp 98.1 F (36.7 C) (Temporal)   Resp 16   Ht 5\' 5"  (1.651 m)   Wt 250 lb 12.8 oz (113.8 kg)   SpO2 98%   BMI 41.74 kg/m  Body mass index is 41.74 kg/m. Physical Exam Vitals and nursing note reviewed.  Constitutional:      Appearance: Normal appearance. She is well-developed.  HENT:     Head: Normocephalic and atraumatic.     Right Ear: External ear normal.     Left Ear: External ear normal.     Ears:     Comments: Cerumen bilaterally, unable to visualize TM completely.     Nose: Nose normal.     Mouth/Throat:     Mouth: Mucous membranes are moist.     Pharynx: Oropharynx is clear.  Eyes:     Conjunctiva/sclera: Conjunctivae normal.  Cardiovascular:     Rate and Rhythm: Normal rate and regular rhythm.     Heart sounds: Normal heart sounds. No murmur heard.   No friction rub. No gallop.  Pulmonary:     Effort: Pulmonary effort is normal.     Breath sounds: Normal breath sounds. No wheezing, rhonchi or rales.  Abdominal:     Palpations: Abdomen is soft.  Musculoskeletal:      Cervical back: Neck supple.  Skin:    General: Skin is warm.     Findings: No rash.  Neurological:     Mental Status: She is alert and oriented to person, place, and time.  Psychiatric:        Behavior: Behavior normal.    The plan was reviewed with the patient/family, and all questions/concerned were addressed.  It was my pleasure to see Jacqualynn today and participate in her care. Please feel free to contact me with any questions or concerns.  Sincerely,  Rexene Alberts, DO Allergy & Immunology  [facility] office: [place] office: [phone]

## 2021-04-06 NOTE — Assessment & Plan Note (Addendum)
Perennial rhinoconjunctivitis symptoms for many years.  Currently on Flonase, Astelin, montelukast and Claritin with minimal benefit.  Skin testing many years ago showed multiple positives per patient report and was on AIT for a few months.  Today's main concern is her newly developed abnormal noise in her ears for the past 7 months along with vertigo.  Concerned if environmental allergies may be contributing to her symptoms.  Today's skin prick testing showed: Positive to one mold and borderline to tree pollen.  Will get bloodwork instead of intradermal testing and will make additional recommendations based on results.  I doubt this is causing her ear symptoms.   Start environmental control measures as below.  Use over the counter antihistamines such as Zyrtec (cetirizine), Claritin (loratadine), Allegra (fexofenadine), or Xyzal (levocetirizine) daily as needed. May take twice a day during allergy flares. May switch antihistamines every few months.  Continue Singulair (montelukast) 10mg  daily at night.  Use Flonase (fluticasone) nasal spray 1 spray per nostril twice a day as needed for nasal congestion.   Use azelastine nasal spray 1-2 sprays per nostril twice a day as needed for runny nose/drainage.  Nasal saline spray (i.e., Simply Saline) or nasal saline lavage (i.e., NeilMed) is recommended as needed and prior to medicated nasal sprays. . Try over the counter decongestants for 3-5 days and see if that helps with the symptoms.

## 2021-04-06 NOTE — Assessment & Plan Note (Signed)
   Continue to avoid medications on allergy list.

## 2021-04-06 NOTE — Assessment & Plan Note (Signed)
Shellfish caused throat swelling in the past. Also avoiding fish. . Continue to avoid seafood. . For mild symptoms you can take over the counter antihistamines such as Benadryl and monitor symptoms closely. If symptoms worsen or if you have severe symptoms including breathing issues, throat closure, significant swelling, whole body hives, severe diarrhea and vomiting, lightheadedness then inject epinephrine and seek immediate medical care afterwards. . Consider testing in the future as this was not patient's main complaint for today's visit.

## 2021-04-06 NOTE — Assessment & Plan Note (Signed)
Discussed with patient that the above environmental allergens are not likely causing her symptoms with the ears. . Follow up with ENT as scheduled.  . See handout on tinnitus.

## 2021-04-08 ENCOUNTER — Other Ambulatory Visit: Payer: Self-pay | Admitting: Specialist

## 2021-04-08 NOTE — Telephone Encounter (Signed)
Please advise 

## 2021-04-11 ENCOUNTER — Other Ambulatory Visit: Payer: Self-pay

## 2021-04-11 ENCOUNTER — Encounter: Payer: Self-pay | Admitting: Physical Therapy

## 2021-04-11 ENCOUNTER — Other Ambulatory Visit: Payer: Self-pay | Admitting: Radiology

## 2021-04-11 ENCOUNTER — Ambulatory Visit: Payer: 59 | Attending: Physical Medicine and Rehabilitation | Admitting: Physical Therapy

## 2021-04-11 DIAGNOSIS — R2689 Other abnormalities of gait and mobility: Secondary | ICD-10-CM

## 2021-04-11 DIAGNOSIS — R2681 Unsteadiness on feet: Secondary | ICD-10-CM | POA: Diagnosis present

## 2021-04-11 DIAGNOSIS — R262 Difficulty in walking, not elsewhere classified: Secondary | ICD-10-CM | POA: Insufficient documentation

## 2021-04-11 DIAGNOSIS — R269 Unspecified abnormalities of gait and mobility: Secondary | ICD-10-CM | POA: Diagnosis present

## 2021-04-11 DIAGNOSIS — R278 Other lack of coordination: Secondary | ICD-10-CM | POA: Insufficient documentation

## 2021-04-11 DIAGNOSIS — M6281 Muscle weakness (generalized): Secondary | ICD-10-CM | POA: Diagnosis not present

## 2021-04-11 DIAGNOSIS — R42 Dizziness and giddiness: Secondary | ICD-10-CM | POA: Insufficient documentation

## 2021-04-11 LAB — ALLERGENS W/TOTAL IGE AREA 2

## 2021-04-11 MED ORDER — DICLOFENAC SODIUM 1 % EX GEL: 4.0000 g | Freq: Four times a day (QID) | CUTANEOUS | 3 refills | Status: DC

## 2021-04-11 NOTE — Therapy (Signed)
[place] MAIN [facility] SERVICES [place], [place], [address] Phone: [phone]   Fax:  [phone]  Physical Therapy Treatment  Patient Details  Name: Vanessa Medina MRN: [id number] Date of Birth: [birth year] Referring Provider (PT): Dr. Erling Cruz   Encounter Date: 04/11/2021   PT End of Session - 04/11/21 1155     Visit Number 43    Number of Visits 51    Date for PT Re-Evaluation 04/18/21    Authorization Type UHC Other    Authorization Time Period 01/24/21-04/18/2021    PT Start Time 1148    PT Stop Time 1230    PT Time Calculation (min) 42 min    Equipment Utilized During Treatment Gait belt    Activity Tolerance Patient tolerated treatment well    Behavior During Therapy WFL for tasks assessed/performed             Past Medical History:  Diagnosis Date   Anemia    Cervical cancer ([place])    Family history of adverse reaction to anesthesia     " my mother takes a long time time wake up"   GERD (gastroesophageal reflux disease)    Headache    migraine   History of shingles    Hypertension    Migraine    Multiple allergies    Obesity    Osteoarthritis    left hip   PONV (postoperative nausea and vomiting)    Sleep apnea    does not wear CPAP   Stroke ([place]) 08/11/2020   Wears glasses     Past Surgical History:  Procedure Laterality Date   ABDOMINAL HYSTERECTOMY     APPENDECTOMY     BILIOPANCREATIC DIVERSION     with duodenal switch laparoscopic    CHOLECYSTECTOMY     DIAGNOSTIC LAPAROSCOPY     LOA   ESOPHAGOGASTRODUODENOSCOPY (EGD) WITH PROPOFOL N/A 07/25/2017   Procedure: ESOPHAGOGASTRODUODENOSCOPY (EGD) WITH PROPOFOL;  Surgeon: Manya Silvas, MD;  Location: [facility] ENDOSCOPY;  Service: Endoscopy;  Laterality: N/A;   KNEE ARTHROSCOPY     LAPAROSCOPIC GASTRIC SLEEVE RESECTION WITH HIATAL HERNIA REPAIR     TONSILLECTOMY     TOTAL HIP ARTHROPLASTY Left 01/26/2016   Procedure: LEFT TOTAL HIP ARTHROPLASTY  ANTERIOR APPROACH;  Surgeon: Leandrew Koyanagi, MD;  Location: [facility];  Service: Orthopedics;  Laterality: Left;   TRANSFORAMINAL LUMBAR INTERBODY FUSION (TLIF) WITH PEDICLE SCREW FIXATION 3 LEVEL  08/2019   Basil Dess, MD L2-L5    There were no vitals filed for this visit.   Subjective Assessment - 04/11/21 1153     Subjective Pt reports doing okay. She is having some soreness/stiffness in back. She states, "I feel like I wrestled with my cat last night." She reports doing a lot more activities at home and states, "Its really weird not coming to therapy as often."    Pertinent History 51 year old female with history of HTN, migraines, morbid obesity--BMI-41, chronic hypotension, multiple back surgeries with chronic pain with LLE lumbar radiculopathy who was scheduled to have spinal cord stimulator placed by Dr. Davy Pique on 08/11/2020 but unable to perform surgery due to scar tissue.  History taken from patient and chart review.  Post op on awakening, she had excruciating pain RLE with plegia and and allodynia. She was admitted for work up by Dr. Ronnald Ramp from surgical center on 08/11/2020. Thoracic MRI done revealing subtle increased T2/STIR intensity distal cord at T12-L1 level suspicious for edema/acute spinal cord  injury, no CSF leak as well as prior PLIF L2-L5 with residual foraminal protrusion L3/5 potentially irritating right L3 nerve. She was started on IV Dilaudid, gabapentin as well as IV Decadron for management of pain, headaches and severe neuropathy.  She continues to have pain in RLE but is having some motor return, has constant headache, decreased hearing in right> left ear, constipation with nausea as well as epigastric pain and weakness. Therapy ongoing and CIR recommended due to functional decline.  Of note, she reports 50 lbs weigh loss in the past 3-4 months--due to issues with N/V/intake. Has had two falls last month---no recall of incident leading to fall question due to syncope. Was in process  of work up for renal disease. She has had issue with LLE weakness with pain for the past year and being followed by Dr. Ernestina Patches. Did well with stimulator trial.    Currently in Pain? Yes    Pain Score 7     Pain Location Back    Pain Orientation Lower    Pain Descriptors / Indicators Aching    Pain Type Acute pain    Pain Onset 1 to 4 weeks ago    Pain Frequency Intermittent    Aggravating Factors  bending/transfers/walking and standing    Pain Relieving Factors rest/heat/flexion exercise    Effect of Pain on Daily Activities decreased activity tolerance;    Multiple Pain Sites No                    TREATMENT:   Gait on treadmill 1.0- 2.0 mph x  5  min with 2 HHA and cues to increase step length for better gait safety; 0.12 mile distance;  Patient able to exhibit good reciprocal gait pattern. She does report mild shortness of breath;  Vitals, HR 88, SPo2 93% RPE rated at 7-8/10    Next to support:  On airex pad: -static stand with NBOS 2x30 sec. Pt exhibits good ankle righting reactions -NBOS with horizontal head turns - x3 reps each direction with increased posterior loss of balance requiring min A for righting;  -NBOS with vertical head turns- pt exhibits multiple posterior LOB, using intermittent UE assist and CGA from PT to regain balance -EC NBOS x10 sec with immediate unsteadiness veering side/side and posteriorly requiring CGA for safety; Reports increased nausea and feeling sick with eyes closed;    Standing on airex: -heel raises unsupported x12 reps -alternate toe taps to 8 inch step with 1 rail assist x10 reps each LE -mini squat unsupported x10 reps; Patient required CGA for safety especially with reduced rail assist.   Gait with tripod base cane: Weaving around cones #5 x4 laps with CGA for safety with good reciprocal gait pattern. Patient does require CGA for safety  Advanced HEP with instruction to sit in chair with back unsupported, eyes open/closed  to improve sensory reorientation with eyes closed.   Patient tolerated session well. She does report increased nausea/dizziness when standing on airex with eyes closed. She exhibits increased unsteadiness with balance exercise especially with reduced rail assist requiring CGA to min A for safety;                    PT Education - 04/11/21 1155     Education Details LE strengthening, balance/gait safety;    Person(s) Educated Patient    Methods Explanation;Verbal cues    Comprehension Verbalized understanding;Returned demonstration;Verbal cues required;Need further instruction  PT Short Term Goals - 03/22/21 1713       PT SHORT TERM GOAL #1   Title Pt will be independent with HEP in order to improve strength and balance in order to decrease fall risk and improve function at home and work.    Baseline 10/14/2020 on-going/deferred; 10/28/2020 ongoing pt has resumed HEP, reports doing what she can/HEP to be advanced; 11/18/2020  Pt has had trouble doing HEP d/t fatigue.; 5/23: Pt reports independence    Time 6    Period Weeks    Status Achieved    Target Date 01/24/21               PT Long Term Goals - 03/22/21 1713       PT LONG TERM GOAL #1   Title Patient (< 69 years old) will complete five times sit to stand test in < 10 seconds indicating an increased LE strength and improved balance.    Baseline 12/29: 38.45s; 10/14/2020 42.8 seconds; 10/28/2020 36 seconds; 11/18/2020 34.84 seconds; 12/30/20 41.55sec; 5/23: 27.2 sec 7/19: 23 seconds    Time 12    Period Weeks    Status Partially Met    Target Date 04/18/21      PT LONG TERM GOAL #2   Title Pt will decrease TUG to below 14 seconds/decrease in order to demonstrate decreased fall risk.    Baseline 12/29: 32.94s; 10/14/2020 43.12 sec; 10/28/2020 34.12 seconds; 11/18/2020 26.5 sec; 12/30/20: 31sec c SPC; 5/23: 24.6 with SPC 7/19: 20.4 seconds    Time 12    Period Weeks    Status Partially Met     Target Date 04/18/21      PT LONG TERM GOAL #3   Title Pt will decrease DHI score by at least 18 points in order to demonstrate clinically significant reduction in disability    Baseline 12/29: 30; 10/14/2020 62, indicating severe handicap; 10/28/2020 74' 5/23: 72 indicating severe handicap 7/20: 62   Not reassessed, but reports no significant change/improvement with dizziness   Time 12    Period Weeks    Status Partially Met    Target Date 04/18/21      PT LONG TERM GOAL #4   Title Patient will increase 10 meter walk test to >1.56ms as to improve gait speed for better community ambulation and to reduce fall risk.    Baseline 12/29: 0.37 m/s; 0.38 m/s; 2/10/08/2020 0.31 m/s 11/18/2020 0.34 m/s with SPC; 0.284m 12/30/20; 5/23: 0.45 [facility]: 0.59 m/s with cane    Time 12    Period Weeks    Status Partially Met    Target Date 04/18/21      PT LONG TERM GOAL #5   Title Patient will increase FOTO score to equal to or greater than 64 to demonstrate statistically significant improvement in mobility and quality of life.    Baseline 12/29: 52; 10/14/2020 45; 10/28/2020 45; 11/18/2020 45; 12/30/20 44; 5/23: 51 7/20: 39%    Time 12    Period Weeks    Status On-going    Target Date 04/18/21      PT LONG TERM GOAL #6   Title Patient will increase six minute walk test distance to >1000 for progression to community ambulator and improve gait ability    Baseline 6/6: [facility]: 620 ft limited by pain    Time 10    Period Weeks    Status On-going    Target Date 04/18/21  Plan - 04/11/21 1419     Clinical Impression Statement Patient motivated and participated well within session. She was able to progress with gait on treadmill with increased speed and distance ambulated. Patient does fatigue and required min A for getting on/off treadmill. Patient instructed in advanced balance tasks. She exhibits increased unsteadiness and sway when standing on compliant  surface with reduced rail assist. Patient reports nausea when standing with eyes closed with increased unsteadiness. She was able to exhibit better control with dynamic movement with heel raises/toe taps. Patient does require CGA to min A with most balance tasks. She was instructed in seated eyes open/closed to improve vestibular input and reduce unsteadiness with eyes closed. She would benefit from additional skilled PT Intervention to improve strength, balance and mobility;    Personal Factors and Comorbidities Comorbidity 3+;Time since onset of injury/illness/exacerbation    Comorbidities HYPOtension, paraparesis, SCI without spinal bone injury, DDD, cervical cancer, acute kidney injury, GERD, asthma, bradycardia, L total hip replacement, hypokalemia    Examination-Activity Limitations Squat;Lift;Stairs;Bend;Stand;Reach Overhead;Carry;Transfers    Examination-Participation Restrictions Cleaning;Laundry;Meal Prep;Community Activity;Driving;Occupation    Stability/Clinical Decision Making Evolving/Moderate complexity    Rehab Potential Fair    PT Frequency 3x / week    PT Treatment/Interventions ADLs/Self Care Home Management;Aquatic Therapy;Gait training;Stair training;Functional mobility training;Therapeutic activities;Therapeutic exercise;Balance training;Moist Heat;Manual techniques;Patient/family education;Neuromuscular re-education;Passive range of motion;Energy conservation;Vestibular;Joint Manipulations;Spinal Manipulations    PT Next Visit Plan endurance, dynamic and static balance,endurance, treadmill, leg press    PT Home Exercise Plan PROGRESS note: add new HEP, initiate UBE bike for cardiovasc endurance, R ankle motor control/strength, focus on STS and ambulation; added 11/18/2020 (handout) hip adduction, thoracic lumbar extension and sit<>stand with counter support; no changes    Consulted and Agree with Plan of Care Patient             Patient will benefit from skilled therapeutic  intervention in order to improve the following deficits and impairments:  Abnormal gait, Dizziness, Improper body mechanics, Decreased mobility, Cardiopulmonary status limiting activity, Decreased activity tolerance, Decreased endurance, Decreased range of motion, Decreased strength, Decreased balance, Decreased safety awareness, Difficulty walking, Impaired flexibility, Obesity  Visit Diagnosis: Muscle weakness (generalized)  Other abnormalities of gait and mobility  Unsteadiness on feet  Other lack of coordination  Dizziness and giddiness     Problem List Patient Active Problem List   Diagnosis Date Noted   Abnormal sensation in both ears 04/06/2021   Other adverse food reactions, not elsewhere classified, subsequent encounter 04/06/2021   Multiple drug allergies 04/06/2021   Insomnia due to medical condition 02/25/2021   Myofascial muscle pain 02/25/2021   Nerve pain 10/18/2020   Incomplete paraplegia (HCC) 10/18/2020   Orthostatic hypotension 10/18/2020   Chronic migraine without aura without status migrainosus, not intractable    Hypokalemia    Adjustment reaction with anxiety and depression    Spinal cord injury, lumbar, without spinal bone injury, sequela ([place]) 08/18/2020   Paraparesis (HCC)    Post-operative pain    Hypotension    Slow transit constipation    AKI (acute kidney injury) ([place])    Right leg weakness 08/11/2020   DDD (degenerative disc disease), lumbar 09/02/2019    Class: Chronic   Degenerative disc disease, lumbar 09/02/2019   Chest tightness 09/23/2018   Bradycardia 09/23/2018   Labile blood pressure 03/08/2018   Chronic low back pain 09/03/2017   History of total hip replacement, left 01/26/2016   EDEMA 04/28/2008   LEG PAIN, BILATERAL 12/16/2007   CERVICAL CANCER  07/02/2007   MORBID OBESITY 07/02/2007   DEPRESSION 07/02/2007   COMMON MIGRAINE 07/02/2007   Other allergic rhinitis 07/02/2007   ASTHMA 07/02/2007   GERD 07/02/2007   ELEVATED  BLOOD PRESSURE WITHOUT DIAGNOSIS OF HYPERTENSION 07/02/2007    Nevea Spiewak PT, DPT 04/11/2021, 2:23 PM  [place] MAIN [facility] SERVICES [address] [place], [place], [address] Phone: [phone]   Fax:  [phone]  Name: Vanessa Medina MRN: [id number] Date of Birth: [birth year]

## 2021-04-11 NOTE — Patient Instructions (Signed)
Sitting in chair, forward (back unsupported)  Sit with eyes open for 30 sec and then close eyes for 15-20 sec; repeast 2-3 times daily

## 2021-04-12 ENCOUNTER — Ambulatory Visit (INDEPENDENT_AMBULATORY_CARE_PROVIDER_SITE_OTHER): Payer: 59 | Admitting: Neurology

## 2021-04-12 DIAGNOSIS — R202 Paresthesia of skin: Secondary | ICD-10-CM

## 2021-04-12 NOTE — Procedures (Signed)
[facility]  [address], [place]  [place], [place] [address] Tel: [phone] Fax:  [phone] Test Date:  04/12/2021  Patient: Vanessa Medina DOB: Jan 21, 1971 Physician: Narda Amber, DO  Sex: Female Height: 5\' 5"  Ref Phys: Narda Amber, DO  ID#: [id number]   Technician:    Patient Complaints: This is a 50 year old female referred for evaluation of right foot pain.  NCV & EMG Findings: Extensive electrodiagnostic testing of the right lower extremity shows:  Right sural and superficial peroneal sensory responses are within normal limits. Right peroneal and tibial motor responses are within normal limits. Right tibial H reflex study is within normal limits. There is no evidence of active or chronic motor axonal loss changes affecting any of the tested muscles.  There is a global pattern of incomplete motor unit activation as seen by variable recruitment, these findings are most likely due to pain or poor effort.  Impression: This is a normal study of the right lower extremity.  In particular, there is no evidence of a sensorimotor polyneuropathy or lumbosacral radiculopathy.   ___________________________ Narda Amber, DO    Nerve Conduction Studies Anti Sensory Summary Table   Stim Site NR Peak (ms) Norm Peak (ms) P-T Amp (V) Norm P-T Amp  Right Sup Peroneal Anti Sensory (Ant Lat Mall)  33C  12 cm    2.0 <4.6 9.6 >4  Right Sural Anti Sensory (Lat Mall)  33C  Calf    2.8 <4.6 13.4 >4   Motor Summary Table   Stim Site NR Onset (ms) Norm Onset (ms) O-P Amp (mV) Norm O-P Amp Site1 Site2 Delta-0 (ms) Dist (cm) Vel (m/s) Norm Vel (m/s)  Right Peroneal Motor (Ext Dig Brev)  33C  Ankle    3.4 <6.0 5.6 >2.5 B Fib Ankle 7.1 39.0 55 >40  B Fib    10.5  5.2  Poplt B Fib 1.4 8.0 57 >40  Poplt    11.9  5.2         Right Tibial Motor (Abd Hall Brev)  33C  Ankle    3.1 <6.0 15.1 >4 Knee Ankle 8.9 43.0 48 >40  Knee    12.0  12.6          H Reflex Studies   NR  H-Lat (ms) Lat Norm (ms) L-R H-Lat (ms)  Right Tibial ([place])  33C     29.93 <35    EMG   Side Muscle Ins Act Fibs Psw Fasc Number Recrt Dur Dur. Amp Amp. Poly Poly. Comment  Right Gastroc Nml Nml Nml Nml 1- Mod-V Nml Nml Nml Nml Nml Nml N/A  Right AntTibialis Nml Nml Nml Nml 1- Mod-V Nml Nml Nml Nml Nml Nml N/A  Right Flex Dig Long Nml Nml Nml Nml 1- Mod-V Nml Nml Nml Nml Nml Nml N/A  Right RectFemoris Nml Nml Nml Nml 1- Mod-V Nml Nml Nml Nml Nml Nml N/A  Right GluteusMed Nml Nml Nml Nml 1- Mod-V Nml Nml Nml Nml Nml Nml N/A  Right BicepsFemS Nml Nml Nml Nml 1- Mod-V Nml Nml Nml Nml Nml Nml N/A      Waveforms:

## 2021-04-13 ENCOUNTER — Telehealth: Payer: Self-pay | Admitting: Neurology

## 2021-04-13 NOTE — Progress Notes (Signed)
Left patient voicemail regarding results

## 2021-04-13 NOTE — Telephone Encounter (Signed)
Pt called in and left a message returning Chelsea's call 

## 2021-04-14 ENCOUNTER — Other Ambulatory Visit: Payer: Self-pay | Admitting: *Deleted

## 2021-04-14 ENCOUNTER — Ambulatory Visit: Payer: 59

## 2021-04-14 NOTE — Telephone Encounter (Signed)
Requesting refills of baclofen, topiramate, montelukast

## 2021-04-15 MED ORDER — TOPIRAMATE 100 MG PO TABS: ORAL_TABLET | ORAL | 3 refills | Status: DC

## 2021-04-15 MED ORDER — MONTELUKAST SODIUM 10 MG PO TABS: 10.0000 mg | ORAL_TABLET | Freq: Every day | ORAL | 3 refills | Status: DC

## 2021-04-15 MED ORDER — BACLOFEN 10 MG PO TABS: 10.0000 mg | ORAL_TABLET | Freq: Three times a day (TID) | ORAL | 3 refills | Status: DC

## 2021-04-19 ENCOUNTER — Other Ambulatory Visit: Payer: Self-pay | Admitting: *Deleted

## 2021-04-19 ENCOUNTER — Ambulatory Visit: Payer: 59 | Admitting: Physical Therapy

## 2021-04-19 MED ORDER — BACLOFEN 10 MG PO TABS: 10.0000 mg | ORAL_TABLET | Freq: Three times a day (TID) | ORAL | 3 refills | Status: DC

## 2021-04-19 MED ORDER — MONTELUKAST SODIUM 10 MG PO TABS: 10.0000 mg | ORAL_TABLET | Freq: Every day | ORAL | 3 refills | Status: DC

## 2021-04-19 MED ORDER — TOPIRAMATE 100 MG PO TABS: ORAL_TABLET | ORAL | 3 refills | Status: DC

## 2021-04-25 ENCOUNTER — Ambulatory Visit: Payer: 59 | Admitting: Physical Therapy

## 2021-04-26 ENCOUNTER — Other Ambulatory Visit: Payer: Self-pay

## 2021-04-26 ENCOUNTER — Ambulatory Visit (INDEPENDENT_AMBULATORY_CARE_PROVIDER_SITE_OTHER): Payer: 59 | Admitting: Orthopaedic Surgery

## 2021-04-26 ENCOUNTER — Encounter: Payer: Self-pay | Admitting: Orthopaedic Surgery

## 2021-04-26 DIAGNOSIS — M1711 Unilateral primary osteoarthritis, right knee: Secondary | ICD-10-CM

## 2021-04-26 MED ORDER — BUPIVACAINE HCL 0.5 % IJ SOLN
2.0000 mL | INTRAMUSCULAR | Status: AC | PRN
  Administered 2021-04-26: 2 mL via INTRA_ARTICULAR

## 2021-04-26 MED ORDER — METHYLPREDNISOLONE ACETATE 40 MG/ML IJ SUSP
40.0000 mg | INTRAMUSCULAR | Status: AC | PRN
Start: 2021-04-26 — End: 2021-04-26
  Administered 2021-04-26: 40 mg via INTRA_ARTICULAR

## 2021-04-26 MED ORDER — LIDOCAINE HCL 1 % IJ SOLN
2.0000 mL | INTRAMUSCULAR | Status: AC | PRN
  Administered 2021-04-26: 2 mL

## 2021-04-26 NOTE — Progress Notes (Signed)
Office Visit Note   Patient: Vanessa Medina           Date of Birth: December 03, 1970           MRN: [id number] Visit Date: 04/26/2021              Requested by: Maryland Pink, MD [address] [place],  [place] [address] PCP: Maryland Pink, MD   Assessment & Plan: Visit Diagnoses:  1. Primary osteoarthritis of right knee     Plan: [name] does have moderately severe tricompartment DJD.  Unfortunately she has had a very rough year.  She has not had any relief from Visco injection.  Currently she is not a great surgical candidate for total knee replacement as she is still recovering from complications related to the spinal cord stimulator placement.  I am glad that she feels the same way currently.  Based on her treatment options she would like to undergo cortisone injection today.  She will continue use Voltaren gel.  We will see her back as needed.  Follow-Up Instructions: No follow-ups on file.   Orders:  No orders of the defined types were placed in this encounter.  No orders of the defined types were placed in this encounter.     Procedures: Large Joint Inj: R knee on 04/26/2021 12:58 PM Indications: pain Details: 22 G needle  Arthrogram: No  Medications: 40 mg methylPREDNISolone acetate 40 MG/ML; 2 mL lidocaine 1 %; 2 mL bupivacaine 0.5 % Consent was given by the patient. Patient was prepped and draped in the usual sterile fashion.      Clinical Data: No additional findings.   Subjective: Chief Complaint  Patient presents with   Right Knee - Pain    HPI [name] returns today for follow-up of chronic right knee pain due to DJD.  So far this year she has had a very rough year and that she had a complications related to spinal cord stimulator placement which was ultimately aborted.  She had stroke and temporary paresis from this procedure.  She is still doing physical therapy for residual weakness.  Currently walking with a cane and a hinged knee  brace.  Review of Systems  Constitutional: Negative.   HENT: Negative.    Eyes: Negative.   Respiratory: Negative.    Cardiovascular: Negative.   Endocrine: Negative.   Musculoskeletal: Negative.   Neurological: Negative.   Hematological: Negative.   Psychiatric/Behavioral: Negative.    All other systems reviewed and are negative.   Objective: Vital Signs: There were no vitals taken for this visit.  Physical Exam Vitals and nursing note reviewed.  Constitutional:      Appearance: She is well-developed.  Pulmonary:     Effort: Pulmonary effort is normal.  Skin:    General: Skin is warm.     Capillary Refill: Capillary refill takes less than 2 seconds.  Neurological:     Mental Status: She is alert and oriented to person, place, and time.  Psychiatric:        Behavior: Behavior normal.        Thought Content: Thought content normal.        Judgment: Judgment normal.    Ortho Exam Right knee shows fully healed midline scar from prior ACL surgery.  Moderate pain with range of motion.  Patellofemoral crepitus with range of motion.  Collaterals and cruciates are grossly stable.  No joint effusion.  Specialty Comments:  No specialty comments available.  Imaging:  No results found.   PMFS History: Patient Active Problem List   Diagnosis Date Noted   Abnormal sensation in both ears 04/06/2021   Other adverse food reactions, not elsewhere classified, subsequent encounter 04/06/2021   Multiple drug allergies 04/06/2021   Insomnia due to medical condition 02/25/2021   Myofascial muscle pain 02/25/2021   Nerve pain 10/18/2020   Incomplete paraplegia (HCC) 10/18/2020   Orthostatic hypotension 10/18/2020   Chronic migraine without aura without status migrainosus, not intractable    Hypokalemia    Adjustment reaction with anxiety and depression    Spinal cord injury, lumbar, without spinal bone injury, sequela ([place]) 08/18/2020   Paraparesis ([place])    Post-operative pain     Hypotension    Slow transit constipation    AKI (acute kidney injury) ([place])    Right leg weakness 08/11/2020   DDD (degenerative disc disease), lumbar 09/02/2019    Class: Chronic   Degenerative disc disease, lumbar 09/02/2019   Chest tightness 09/23/2018   Bradycardia 09/23/2018   Labile blood pressure 03/08/2018   Chronic low back pain 09/03/2017   History of total hip replacement, left 01/26/2016   EDEMA 04/28/2008   LEG PAIN, BILATERAL 12/16/2007   CERVICAL CANCER 07/02/2007   MORBID OBESITY 07/02/2007   DEPRESSION 07/02/2007   COMMON MIGRAINE 07/02/2007   Other allergic rhinitis 07/02/2007   ASTHMA 07/02/2007   GERD 07/02/2007   ELEVATED BLOOD PRESSURE WITHOUT DIAGNOSIS OF HYPERTENSION 07/02/2007   Past Medical History:  Diagnosis Date   Anemia    Cervical cancer ([place])    Family history of adverse reaction to anesthesia     " my mother takes a long time time wake up"   GERD (gastroesophageal reflux disease)    Headache    migraine   History of shingles    Hypertension    Migraine    Multiple allergies    Obesity    Osteoarthritis    left hip   PONV (postoperative nausea and vomiting)    Sleep apnea    does not wear CPAP   Stroke ([place]) 08/11/2020   Wears glasses     Family History  Problem Relation Age of Onset   Renal Disease Mother    Hypertension Mother    Sudden Cardiac Death Mother    Heart failure Mother    Valvular heart disease Mother    Heart disease Mother    Stroke Brother    Heart attack Brother 57   Diabetes Other    Breast cancer Cousin        paternal side    Past Surgical History:  Procedure Laterality Date   ABDOMINAL HYSTERECTOMY     APPENDECTOMY     BILIOPANCREATIC DIVERSION     with duodenal switch laparoscopic    CHOLECYSTECTOMY     DIAGNOSTIC LAPAROSCOPY     LOA   ESOPHAGOGASTRODUODENOSCOPY (EGD) WITH PROPOFOL N/A 07/25/2017   Procedure: ESOPHAGOGASTRODUODENOSCOPY (EGD) WITH PROPOFOL;  Surgeon: Manya Silvas, MD;   Location: [facility] END[facility]COPY;  Service: Endoscopy;  Laterality: N/A;   KNEE ARTHROSCOPY     LAPAROSCOPIC GASTRIC SLEEVE RESECTION WITH HIATAL HERNIA REPAIR     TONSILLECTOMY     TOTAL HIP ARTHROPLASTY Left 01/26/2016   Procedure: LEFT TOTAL HIP ARTHROPLASTY ANTERIOR APPROACH;  Surgeon: Leandrew Koyanagi, MD;  Location: [place];  Service: Orthopedics;  Laterality: Left;   TRANSFORAMINAL LUMBAR INTERBODY FUSION (TLIF) WITH PEDICLE SCREW FIXATION 3 LEVEL  08/2019   Basil Dess, MD L2-L5  Social History   Occupational History   Not on file  Tobacco Use   Smoking status: Never   Smokeless tobacco: Never  Vaping Use   Vaping Use: Never used  Substance and Sexual Activity   Alcohol use: Yes    Comment: occasional wine   Drug use: No   Sexual activity: Not Currently

## 2021-04-28 ENCOUNTER — Other Ambulatory Visit: Payer: Self-pay

## 2021-04-28 ENCOUNTER — Ambulatory Visit: Payer: 59

## 2021-04-28 DIAGNOSIS — M6281 Muscle weakness (generalized): Secondary | ICD-10-CM

## 2021-04-28 DIAGNOSIS — R262 Difficulty in walking, not elsewhere classified: Secondary | ICD-10-CM

## 2021-04-28 DIAGNOSIS — R278 Other lack of coordination: Secondary | ICD-10-CM

## 2021-04-28 DIAGNOSIS — R269 Unspecified abnormalities of gait and mobility: Secondary | ICD-10-CM

## 2021-04-29 ENCOUNTER — Encounter: Payer: Self-pay | Admitting: Physical Medicine and Rehabilitation

## 2021-04-29 ENCOUNTER — Encounter: Payer: 59 | Attending: Physical Medicine and Rehabilitation | Admitting: Physical Medicine and Rehabilitation

## 2021-04-29 ENCOUNTER — Other Ambulatory Visit: Payer: Self-pay

## 2021-04-29 VITALS — BP 125/85 | HR 70 | Temp 98.4°F | Ht 60.0 in

## 2021-04-29 DIAGNOSIS — I951 Orthostatic hypotension: Secondary | ICD-10-CM | POA: Insufficient documentation

## 2021-04-29 DIAGNOSIS — R269 Unspecified abnormalities of gait and mobility: Secondary | ICD-10-CM | POA: Insufficient documentation

## 2021-04-29 DIAGNOSIS — G4701 Insomnia due to medical condition: Secondary | ICD-10-CM | POA: Diagnosis not present

## 2021-04-29 DIAGNOSIS — G8220 Paraplegia, unspecified: Secondary | ICD-10-CM | POA: Insufficient documentation

## 2021-04-29 NOTE — Progress Notes (Signed)
Subjective:    Patient ID: Vanessa Medina, female    DOB: 1971/06/21, 50 y.o.   MRN: [id number]  HPI Pt is a 50 yr old female with paraparesis/incomplete paraplegia due to attempted spinal cord stimulator placement- With chronic hx of orthostasis, BMI of 41 s/p gastric sleeve, Acute on chronic renal issues, here for f/u on SCI and orthostatic hypotension. Vestibular issues due to trigger points and central issues.    Here for f/u on incomplete paraplegia and myofascial pain.   Got billed the full price for Trigger point injections last time-  Hasn't discussed with front desk yet.    Still has major fluttering in the ears- been to ENT/allergist. Has "no allergies". Did research online and thinks it's another form of Tinnitus.  It's not going away. Has it almost 24 hours/day.  Some days are really bad. Feels like will be "carried off"  Saw Dr Margarita Grizzle- ENT- wasn't helpful at all.  Doesn't want to go back to her.  Going back to original ENT has seen in past [place].  Lasted a little bit longer this last time- still has hummingbirds taking her off somewhere. Makes it hard to sleep. Feels like full of fluid.  Was told can do eustachian tubes by research online, but will speak to old ENT first.   Starting to have back spasms- just started again recently.  Down to last 4 PT appointments- per insurance- spaced 1 q2 weeks.   Doing HEP daily in between.   Walking with cane.   Cannot make it a full day without a nap- at least 45-60 minutes-  Usually takes 1-2 pm Does feel better when comes out of the nap.  But concerned if takes on too much work, will get too fatigued.   R knee- no surgery-did get cortisone injection on 04/26/21-    Decided against TKR on R at this time.   Tried Benadryl and melatonin for sleep- didn't work. Kicked cat out of room as well.       Pain Inventory Average Pain 7 Pain Right Now 5 My pain is intermittent, sharp, and dull  In the  last 24 hours, has pain interfered with the following? General activity 9 Relation with others 0 Enjoyment of life 9 What TIME of day is your pain at its worst? evening and night Sleep (in general) Poor  Pain is worse with: walking, bending, sitting, and standing Pain improves with: rest Relief from Meds:  none taken  Family History  Problem Relation Age of Onset   Renal Disease Mother    Hypertension Mother    Sudden Cardiac Death Mother    Heart failure Mother    Valvular heart disease Mother    Heart disease Mother    Stroke Brother    Heart attack Brother 37   Diabetes Other    Breast cancer Cousin        paternal side   Social History   Socioeconomic History   Marital status: Divorced    Spouse name: Not on file   Number of children: 1   Years of education: Not on file   Highest education level: Not on file  Occupational History   Not on file  Tobacco Use   Smoking status: Never   Smokeless tobacco: Never  Vaping Use   Vaping Use: Never used  Substance and Sexual Activity   Alcohol use: Yes    Comment: occasional wine   Drug use: No  Sexual activity: Not Currently  Other Topics Concern   Not on file  Social History Narrative   Right handed    Uses cane to walk    Social Determinants of Health   Financial Resource Strain: Not on file  Food Insecurity: Not on file  Transportation Needs: Not on file  Physical Activity: Not on file  Stress: Not on file  Social Connections: Not on file   Past Surgical History:  Procedure Laterality Date   ABDOMINAL HYSTERECTOMY     APPENDECTOMY     BILIOPANCREATIC DIVERSION     with duodenal switch laparoscopic    CHOLECYSTECTOMY     DIAGNOSTIC LAPAROSCOPY     LOA   ESOPHAGOGASTRODUODENOSCOPY (EGD) WITH PROPOFOL N/A 07/25/2017   Procedure: ESOPHAGOGASTRODUODENOSCOPY (EGD) WITH PROPOFOL;  Surgeon: Manya Silvas, MD;  Location: [facility] ENDOSCOPY;  Service: Endoscopy;  Laterality: N/A;   KNEE ARTHROSCOPY      LAPAROSCOPIC GASTRIC SLEEVE RESECTION WITH HIATAL HERNIA REPAIR     TONSILLECTOMY     TOTAL HIP ARTHROPLASTY Left 01/26/2016   Procedure: LEFT TOTAL HIP ARTHROPLASTY ANTERIOR APPROACH;  Surgeon: Leandrew Koyanagi, MD;  Location: [place];  Service: Orthopedics;  Laterality: Left;   TRANSFORAMINAL LUMBAR INTERBODY FUSION (TLIF) WITH PEDICLE SCREW FIXATION 3 LEVEL  08/2019   Basil Dess, MD L2-L5   Past Surgical History:  Procedure Laterality Date   ABDOMINAL HYSTERECTOMY     APPENDECTOMY     BILIOPANCREATIC DIVERSION     with duodenal switch laparoscopic    CHOLECYSTECTOMY     DIAGNOSTIC LAPAROSCOPY     LOA   ESOPHAGOGASTRODUODENOSCOPY (EGD) WITH PROPOFOL N/A 07/25/2017   Procedure: ESOPHAGOGASTRODUODENOSCOPY (EGD) WITH PROPOFOL;  Surgeon: Manya Silvas, MD;  Location: [facility] ENDOSCOPY;  Service: Endoscopy;  Laterality: N/A;   KNEE ARTHROSCOPY     LAPAROSCOPIC GASTRIC SLEEVE RESECTION WITH HIATAL HERNIA REPAIR     TONSILLECTOMY     TOTAL HIP ARTHROPLASTY Left 01/26/2016   Procedure: LEFT TOTAL HIP ARTHROPLASTY ANTERIOR APPROACH;  Surgeon: Leandrew Koyanagi, MD;  Location: [place];  Service: Orthopedics;  Laterality: Left;   TRANSFORAMINAL LUMBAR INTERBODY FUSION (TLIF) WITH PEDICLE SCREW FIXATION 3 LEVEL  08/2019   Basil Dess, MD L2-L5   Past Medical History:  Diagnosis Date   Anemia    Cervical cancer [facility])    Family history of adverse reaction to anesthesia     " my mother takes a long time time wake up"   GERD (gastroesophageal reflux disease)    Headache    migraine   History of shingles    Hypertension    Migraine    Multiple allergies    Obesity    Osteoarthritis    left hip   PONV (postoperative nausea and vomiting)    Sleep apnea    does not wear CPAP   Stroke ([place]) 08/11/2020   Wears glasses    Pulse 80   Temp 98.4 F (36.9 C)   Ht 5' (1.524 m)   SpO2 99%   BMI 48.98 kg/m   Opioid Risk Score:   Fall Risk Score:  `1  Depression screen PHQ 2/9  Depression screen  [facility] 2/9 04/29/2021 02/25/2021 10/18/2020 09/15/2020  Decreased Interest 0 0 0 0  Down, Depressed, Hopeless 0 0 0 0  PHQ - 2 Score 0 0 0 0  Altered sleeping - - - 0  Tired, decreased energy - - - 0  Change in appetite - - - 0  Feeling bad or  failure about yourself  - - - 0  Trouble concentrating - - - 0  Moving slowly or fidgety/restless - - - 1  Suicidal thoughts - - - 0  PHQ-9 Score - - - 1  Difficult doing work/chores - - - Somewhat difficult  Some recent data might be hidden    Review of Systems  Musculoskeletal:  Positive for back pain and gait problem.       Right foot pain      Objective:   Physical Exam  Awake, alert, appropriate, using single point cane with 4 prongs on end; same weight or so; NAD Nasal/congested sounding R knee brace,  R knee slightly swollen Numbness R foot- on bottom of R foot- almost absent-       Assessment & Plan:   Pt is a 50 yr old female with paraparesis/incomplete paraplegia due to attempted spinal cord stimulator placement- With chronic hx of orthostasis, BMI of 41 s/p gastric sleeve, Acute on chronic renal issues, here for f/u on SCI and orthostatic hypotension. Vestibular issues due to trigger points and central issues.    Change work schedule to [employer] and take 1.5 hour for lunch and then 12:30 to 2:30- and off Wednesday- so 6 hours 4 days/week. Still gives her time to get therapy appointments/doctor's appointment, and get more work done.   2.  Filled out new form for pt regarding her work restrictions-    3. Cont Midodrine 10 mg TID and Florinef 0.2 mg daily- NO CHANGES  4. Seeing ENT for unusual tinnitus- to see Dr Cira Rue.   5. Cut down on watermelon and tomatoes! For bladder issues.   6. F/U  in 3 months- double appointment- trigger point injections at next f/u with SCI f/u.    I spent a total of 28 minutes on visit- discussing forms, dx's and hypotension.

## 2021-04-29 NOTE — Therapy (Signed)
[place] MAIN [facility] SERVICES [address] [place], [place], [address] Phone: [phone]   Fax:  [phone]  Physical Therapy Treatment/Recertification 0/17/4944- 07/21/2021  Patient Details  Name: Vanessa Medina MRN: [id number] Date of Birth: 10-04-1970 Referring Provider (PT): Dr. Erling Cruz   Encounter Date: 04/28/2021   PT End of Session - 04/28/21 0802     Visit Number 66    Number of Visits 60    Date for PT Re-Evaluation 07/22/21    Authorization Type [employer] Other    Authorization Time Period 01/24/21-04/18/2021    PT Start Time 1146    PT Stop Time 1230    PT Time Calculation (min) 44 min    Equipment Utilized During Treatment Gait belt    Activity Tolerance Patient tolerated treatment well    Behavior During Therapy WFL for tasks assessed/performed             Past Medical History:  Diagnosis Date   Anemia    Cervical cancer ([place])    Family history of adverse reaction to anesthesia     " my mother takes a long time time wake up"   GERD (gastroesophageal reflux disease)    Headache    migraine   History of shingles    Hypertension    Migraine    Multiple allergies    Obesity    Osteoarthritis    left hip   PONV (postoperative nausea and vomiting)    Sleep apnea    does not wear CPAP   Stroke ([place]) 08/11/2020   Wears glasses     Past Surgical History:  Procedure Laterality Date   ABDOMINAL HYSTERECTOMY     APPENDECTOMY     BILIOPANCREATIC DIVERSION     with duodenal switch laparoscopic    CHOLECYSTECTOMY     DIAGNOSTIC LAPAROSCOPY     LOA   ESOPHAGOGASTRODUODENOSCOPY (EGD) WITH PROPOFOL N/A 07/25/2017   Procedure: ESOPHAGOGASTRODUODENOSCOPY (EGD) WITH PROPOFOL;  Surgeon: Manya Silvas, MD;  Location: [facility] ENDOSCOPY;  Service: Endoscopy;  Laterality: N/A;   KNEE ARTHROSCOPY     LAPAROSCOPIC GASTRIC SLEEVE RESECTION WITH HIATAL HERNIA REPAIR     TONSILLECTOMY     TOTAL HIP ARTHROPLASTY Left 01/26/2016    Procedure: LEFT TOTAL HIP ARTHROPLASTY ANTERIOR APPROACH;  Surgeon: Leandrew Koyanagi, MD;  Location: [place];  Service: Orthopedics;  Laterality: Left;   TRANSFORAMINAL LUMBAR INTERBODY FUSION (TLIF) WITH PEDICLE SCREW FIXATION 3 LEVEL  08/2019   Basil Dess, MD L2-L5    There were no vitals filed for this visit.   Subjective Assessment - 04/28/21 1219     Subjective Patient reports feeing okay today- reports had a cortizone injection on Monday in right knee and states feeling okay today. Reports one of her remaining goals is working on steps.    Pertinent History 50 year old female with history of HTN, migraines, morbid obesity--BMI-41, chronic hypotension, multiple back surgeries with chronic pain with LLE lumbar radiculopathy who was scheduled to have spinal cord stimulator placed by Dr. Davy Pique on 08/11/2020 but unable to perform surgery due to scar tissue.  History taken from patient and chart review.  Post op on awakening, she had excruciating pain RLE with plegia and and allodynia. She was admitted for work up by Dr. Ronnald Ramp from surgical center on 08/11/2020. Thoracic MRI done revealing subtle increased T2/STIR intensity distal cord at T12-L1 level suspicious for edema/acute spinal cord injury, no CSF leak as well as prior PLIF L2-L5 with  residual foraminal protrusion L3/5 potentially irritating right L3 nerve. She was started on IV Dilaudid, gabapentin as well as IV Decadron for management of pain, headaches and severe neuropathy.  She continues to have pain in RLE but is having some motor return, has constant headache, decreased hearing in right> left ear, constipation with nausea as well as epigastric pain and weakness. Therapy ongoing and CIR recommended due to functional decline.  Of note, she reports 50 lbs weigh loss in the past 3-4 months--due to issues with N/V/intake. Has had two falls last month---no recall of incident leading to fall question due to syncope. Was in process of work up for renal  disease. She has had issue with LLE weakness with pain for the past year and being followed by Dr. Ernestina Patches. Did well with stimulator trial.    Currently in Pain? Yes    Pain Score 2     Pain Location Knee    Pain Orientation Right;Anterior    Pain Descriptors / Indicators Aching    Pain Type Chronic pain    Pain Onset More than a month ago    Pain Frequency Constant    Aggravating Factors  prolonged standing/weightbearing/walking and standing    Pain Relieving Factors rest/heat/knee brace- Recent injection on Monday    Effect of Pain on Daily Activities decreased activiity tolerance               Interventions:   Reassessed all remaining goals: See goal section for details:   FOTO= 51% (improved from last tested at 39%) DHI scale= 54 (improved from last tested at 62) 10 meter walk= 0.75 m/s with SPC (improved from last tested at 0.59 m/s)  TUG= 18.83 sec with SPC (improved from last tested at 20.4 sec) 5xSTS= 16.70 sec with min UE support (improved from 23 sec)  6 min walk test= 760 feet with SPC (improved from 620 feet)  Clinical Impression: Patient demonstrating improvement with all goals - exhibiting improved self perceived abilities as seen by improved FOTO and DHI. She is demo improving LE strength as seen by improved 5 x STS test and improved dynamic mobility and endurance as seen by improved scores on TUG, 10 meter walk test, and 6 min walk test. She is currently ambulating with cane and still limited some by knee pain and weakness. She is appropriate for recertification to continue to focus on LE strengthening, walking, balance and endurance. Added new goal per conversation with patient as she reports having difficulty with steps at home. Patient's condition has the potential to improve in response to therapy. Maximum improvement is yet to be obtained. The anticipated improvement is attainable and reasonable in a generally predictable time.                             PT Short Term Goals - 03/22/21 1713       PT SHORT TERM GOAL #1   Title Pt will be independent with HEP in order to improve strength and balance in order to decrease fall risk and improve function at home and work.    Baseline 10/14/2020 on-going/deferred; 10/28/2020 ongoing pt has resumed HEP, reports doing what she can/HEP to be advanced; 11/18/2020  Pt has had trouble doing HEP d/t fatigue.; 5/23: Pt reports independence    Time 6    Period Weeks    Status Achieved    Target Date 01/24/21  PT Long Term Goals - 04/28/21 3570       PT LONG TERM GOAL #1   Title Patient (< 26 years old) will complete five times sit to stand test in < 10 seconds indicating an increased LE strength and improved balance.    Baseline 12/29: 38.45s; 10/14/2020 42.8 seconds; 10/28/2020 36 seconds; 11/18/2020 34.84 seconds; 12/30/20 41.55sec; 5/23: 27.2 sec 7/19: 23 seconds; 04/28/2021= 16.70 sec with min UE support    Time 12    Period Weeks    Status Partially Met    Target Date 07/21/21      PT LONG TERM GOAL #2   Title Pt will decrease TUG to below 14 seconds/decrease in order to demonstrate decreased fall risk.    Baseline 12/29: 32.94s; 10/14/2020 43.12 sec; 10/28/2020 34.12 seconds; 11/18/2020 26.5 sec; 12/30/20: 31sec c SPC; 5/23: 24.6 with SPC 7/19: 20.4 seconds. 04/28/2021= 18.83 sec using SPC    Time 12    Period Weeks    Status Partially Met    Target Date 07/21/21      PT LONG TERM GOAL #3   Title Pt will decrease DHI score by at least 18 points in order to demonstrate clinically significant reduction in disability    Baseline 12/29: 30; 10/14/2020 62, indicating severe handicap; 10/28/2020 74' 5/23: 72 indicating severe handicap 7/20: 62; 04/28/2021= 54 indicating improvement from severe perception to moderate perception of handicap.   Not reassessed, but reports no significant change/improvement with dizziness   Time 12    Period Weeks     Status Partially Met    Target Date 07/21/21      PT LONG TERM GOAL #4   Title Patient will increase 10 meter walk test to >1.38ms as to improve gait speed for better community ambulation and to reduce fall risk.    Baseline 12/29: 0.37 m/s; 0.38 m/s; 2/10/08/2020 0.31 m/s 11/18/2020 0.34 m/s with SPC; 0.252m 12/30/20; 5/23: 0.45 [facility]: 0.59 m/s with cane; 04/28/2021= 0.75 m/s using SPC    Time 12    Period Weeks    Status Partially Met      PT LONG TERM GOAL #5   Title Patient will increase FOTO score to equal to or greater than 64 to demonstrate statistically significant improvement in mobility and quality of life.    Baseline 12/29: 52; 10/14/2020 45; 10/28/2020 45; 11/18/2020 45; 12/30/20 44; 5/23: 51 7/20: 39%; 04/28/2021= 51%    Time 12    Period Weeks    Status On-going    Target Date 07/21/21      Additional Long Term Goals   Additional Long Term Goals Yes      PT LONG TERM GOAL #6   Title Patient will increase six minute walk test distance to >1000 for progression to community ambulator and improve gait ability    [facility] 6/6[facility]: 620 ft limited by pain; 04/28/2021= 760 feet with SPC    Time 12    Period Weeks    Status On-going    Target Date 07/21/21      PT LONG TERM GOAL #7   Title Patient will ascend/descend 2- 3 steps with 1 rail and use of SPC with modified [place] in front of house to safely enter/exit home.    Baseline 04/28/2021 - Patient currently utilizing back steps (2) secondary to fear of falling using deeper steps in front of house.  Plan - 04/28/21 0809     Clinical Impression Statement Patient demonstrating improvement with all goals - exhibiting improved self perceived abilities as seen by improved FOTO and DHI. She is demo improving LE strength as seen by improved 5 x STS test and improved dynamic mobility and endurance as seen by improved scores on TUG, 10 meter walk test, and 6 min walk test. She  is currently ambulating with cane and still limited some by knee pain and weakness. She is appropriate for recertification to continue to focus on LE strengthening, walking, balance and endurance. Added new goal per conversation with patient as she reports having difficulty with steps at home. Patient's condition has the potential to improve in response to therapy. Maximum improvement is yet to be obtained. The anticipated improvement is attainable and reasonable in a generally predictable time.    Personal Factors and Comorbidities Comorbidity 3+;Time since onset of injury/illness/exacerbation    Comorbidities HYPOtension, paraparesis, SCI without spinal bone injury, DDD, cervical cancer, acute kidney injury, GERD, asthma, bradycardia, L total hip replacement, hypokalemia    Examination-Activity Limitations Squat;Lift;Stairs;Bend;Stand;Reach Overhead;Carry;Transfers    Examination-Participation Restrictions Cleaning;Laundry;Meal Prep;Community Activity;Driving;Occupation    Stability/Clinical Decision Making Evolving/Moderate complexity    Rehab Potential Fair    PT Frequency Other (comment)   1x/every other week   PT Duration 12 weeks    PT Treatment/Interventions ADLs/Self Care Home Management;Aquatic Therapy;Gait training;Stair training;Functional mobility training;Therapeutic activities;Therapeutic exercise;Balance training;Moist Heat;Manual techniques;Patient/family education;Neuromuscular re-education;Passive range of motion;Energy conservation;Vestibular;Joint Manipulations;Spinal Manipulations    PT Next Visit Plan endurance, dynamic and static balance,endurance, treadmill, leg press.Patient has 7 visits left per ins and will plan for 1x/week every other week through end of cert as needed to achieve goals.    PT Home Exercise Plan No changes    Consulted and Agree with Plan of Care Patient             Patient will benefit from skilled therapeutic intervention in order to improve the  following deficits and impairments:  Abnormal gait, Dizziness, Improper body mechanics, Decreased mobility, Cardiopulmonary status limiting activity, Decreased activity tolerance, Decreased endurance, Decreased range of motion, Decreased strength, Decreased balance, Decreased safety awareness, Difficulty walking, Impaired flexibility, Obesity  Visit Diagnosis: Abnormality of gait and mobility  Difficulty in walking, not elsewhere classified  Muscle weakness (generalized)  Other lack of coordination     Problem List Patient Active Problem List   Diagnosis Date Noted   Abnormal sensation in both ears 04/06/2021   Other adverse food reactions, not elsewhere classified, subsequent encounter 04/06/2021   Multiple drug allergies 04/06/2021   Insomnia due to medical condition 02/25/2021   Myofascial muscle pain 02/25/2021   Nerve pain 10/18/2020   Incomplete paraplegia (HCC) 10/18/2020   Orthostatic hypotension 10/18/2020   Chronic migraine without aura without status migrainosus, not intractable    Hypokalemia    Adjustment reaction with anxiety and depression    Spinal cord injury, lumbar, without spinal bone injury, sequela ([place]) 08/18/2020   Paraparesis (HCC)    Post-operative pain    Hypotension    Slow transit constipation    AKI (acute kidney injury) ([place])    Right leg weakness 08/11/2020   DDD (degenerative disc disease), lumbar 09/02/2019    Class: Chronic   Degenerative disc disease, lumbar 09/02/2019   Chest tightness 09/23/2018   Bradycardia 09/23/2018   Labile blood pressure 03/08/2018   Chronic low back pain 09/03/2017   History of total hip replacement, left 01/26/2016   EDEMA 04/28/2008  LEG PAIN, BILATERAL 12/16/2007   CERVICAL CANCER 07/02/2007   MORBID OBESITY 07/02/2007   DEPRESSION 07/02/2007   COMMON MIGRAINE 07/02/2007   Other allergic rhinitis 07/02/2007   ASTHMA 07/02/2007   GERD 07/02/2007   ELEVATED BLOOD PRESSURE WITHOUT DIAGNOSIS OF  HYPERTENSION 07/02/2007    Lewis Moccasin, PT 04/29/2021, 9:09 AM  [place] MAIN [facility] SERVICES [address] [place], [place], [address] Phone: [phone]   Fax:  [phone]  Name: Vanessa Medina MRN: [id number] Date of Birth: 1971-05-18

## 2021-04-29 NOTE — Patient Instructions (Signed)
Pt is a 50 yr old female with paraparesis/incomplete paraplegia due to attempted spinal cord stimulator placement- With chronic hx of orthostasis, BMI of 41 s/p gastric sleeve, Acute on chronic renal issues, here for f/u on SCI and orthostatic hypotension. Vestibular issues due to trigger points and central issues.    Change work schedule to [employer] and take 1.5 hour for lunch and then 12:30 to 2:30- and off Wednesday- so 6 hours 4 days/week. Still gives her time to get therapy appointments/doctor's appointment, and get more work done.   2.  Filled out new form for pt regarding her work restrictions-    3. Cont Midodrine 10 mg TID and Florinef 0.2 mg daily- NO CHANGES  4. Seeing ENT for unusual tinnitus- to see Dr Cira Rue.   5. Cut down on watermelon and tomatoes! For bladder issues.   6. F/U  in 3 months- double appointment

## 2021-05-02 ENCOUNTER — Ambulatory Visit: Payer: 59 | Admitting: Physical Therapy

## 2021-05-04 ENCOUNTER — Ambulatory Visit: Payer: 59

## 2021-05-05 ENCOUNTER — Encounter: Payer: Self-pay | Admitting: Surgery

## 2021-05-05 ENCOUNTER — Ambulatory Visit: Payer: Self-pay

## 2021-05-05 ENCOUNTER — Other Ambulatory Visit: Payer: Self-pay

## 2021-05-05 ENCOUNTER — Ambulatory Visit (INDEPENDENT_AMBULATORY_CARE_PROVIDER_SITE_OTHER): Payer: 59 | Admitting: Surgery

## 2021-05-05 VITALS — BP 127/80 | HR 88

## 2021-05-05 DIAGNOSIS — M5136 Other intervertebral disc degeneration, lumbar region: Secondary | ICD-10-CM

## 2021-05-10 ENCOUNTER — Ambulatory Visit: Payer: 59 | Admitting: Physical Therapy

## 2021-05-11 ENCOUNTER — Other Ambulatory Visit: Payer: Self-pay

## 2021-05-11 ENCOUNTER — Ambulatory Visit: Payer: 59 | Attending: Physical Medicine and Rehabilitation

## 2021-05-11 DIAGNOSIS — M6281 Muscle weakness (generalized): Secondary | ICD-10-CM | POA: Diagnosis present

## 2021-05-11 DIAGNOSIS — R269 Unspecified abnormalities of gait and mobility: Secondary | ICD-10-CM | POA: Diagnosis not present

## 2021-05-11 DIAGNOSIS — R278 Other lack of coordination: Secondary | ICD-10-CM | POA: Insufficient documentation

## 2021-05-11 DIAGNOSIS — R262 Difficulty in walking, not elsewhere classified: Secondary | ICD-10-CM | POA: Diagnosis present

## 2021-05-11 NOTE — Therapy (Signed)
[place] MAIN [facility] SERVICES [place], [place], [address] Phone: [phone]   Fax:  [phone]  Physical Therapy Treatment  Patient Details  Name: Vanessa Medina MRN: [id number] Date of Birth: July 20, 1971 Referring Provider (PT): Dr. Erling Cruz   Encounter Date: 05/11/2021   PT End of Session - 05/11/21 0919     Visit Number 28    Number of Visits 51    Date for PT Re-Evaluation 07/22/21    Authorization Type [employer] Other    Authorization Time Period 01/24/21-04/18/2021    PT Start Time 1430    PT Stop Time 1513    PT Time Calculation (min) 43 min    Equipment Utilized During Treatment Gait belt    Activity Tolerance Patient tolerated treatment well    Behavior During Therapy WFL for tasks assessed/performed             Past Medical History:  Diagnosis Date   Anemia    Cervical cancer ([place])    Family history of adverse reaction to anesthesia     " my mother takes a long time time wake up"   GERD (gastroesophageal reflux disease)    Headache    migraine   History of shingles    Hypertension    Migraine    Multiple allergies    Obesity    Osteoarthritis    left hip   PONV (postoperative nausea and vomiting)    Sleep apnea    does not wear CPAP   Stroke ([place]) 08/11/2020   Wears glasses     Past Surgical History:  Procedure Laterality Date   ABDOMINAL HYSTERECTOMY     APPENDECTOMY     BILIOPANCREATIC DIVERSION     with duodenal switch laparoscopic    CHOLECYSTECTOMY     DIAGNOSTIC LAPAROSCOPY     LOA   ESOPHAGOGASTRODUODENOSCOPY (EGD) WITH PROPOFOL N/A 07/25/2017   Procedure: ESOPHAGOGASTRODUODENOSCOPY (EGD) WITH PROPOFOL;  Surgeon: Manya Silvas, MD;  Location: [facility] ENDOSCOPY;  Service: Endoscopy;  Laterality: N/A;   KNEE ARTHROSCOPY     LAPAROSCOPIC GASTRIC SLEEVE RESECTION WITH HIATAL HERNIA REPAIR     TONSILLECTOMY     TOTAL HIP ARTHROPLASTY Left 01/26/2016   Procedure: LEFT TOTAL HIP ARTHROPLASTY  ANTERIOR APPROACH;  Surgeon: Leandrew Koyanagi, MD;  Location: [facility];  Service: Orthopedics;  Laterality: Left;   TRANSFORAMINAL LUMBAR INTERBODY FUSION (TLIF) WITH PEDICLE SCREW FIXATION 3 LEVEL  08/2019   Basil Dess, MD L2-L5    There were no vitals filed for this visit.   Subjective Assessment - 05/11/21 1431     Subjective Patient reports falling on 04/30/2021 - due to passing out- did not go to ED but did go to her orthopedic MD. Imaging on 9/1 with no results per notes.  Reports falling at [place] and reports pain on left posterior neck. States has not walked much since fall and "Sore all over." Reports not sure what she will be able to today.    Pertinent History 50 year old female with history of HTN, migraines, morbid obesity--BMI-41, chronic hypotension, multiple back surgeries with chronic pain with LLE lumbar radiculopathy who was scheduled to have spinal cord stimulator placed by Dr. Davy Pique on 08/11/2020 but unable to perform surgery due to scar tissue.  History taken from patient and chart review.  Post op on awakening, she had excruciating pain RLE with plegia and and allodynia. She was admitted for work up by Dr. Ronnald Ramp from surgical  center on 08/11/2020. Thoracic MRI done revealing subtle increased T2/STIR intensity distal cord at T12-L1 level suspicious for edema/acute spinal cord injury, no CSF leak as well as prior PLIF L2-L5 with residual foraminal protrusion L3/5 potentially irritating right L3 nerve. She was started on IV Dilaudid, gabapentin as well as IV Decadron for management of pain, headaches and severe neuropathy.  She continues to have pain in RLE but is having some motor return, has constant headache, decreased hearing in right> left ear, constipation with nausea as well as epigastric pain and weakness. Therapy ongoing and CIR recommended due to functional decline.  Of note, she reports 50 lbs weigh loss in the past 3-4 months--due to issues with N/V/intake. Has had two  falls last month---no recall of incident leading to fall question due to syncope. Was in process of work up for renal disease. She has had issue with LLE weakness with pain for the past year and being followed by Dr. Ernestina Patches. Did well with stimulator trial.    Currently in Pain? Yes    Pain Score 8     Pain Location Neck    Pain Orientation Left;Posterior    Pain Descriptors / Indicators Aching;Sharp    Pain Type Acute pain    Pain Onset 1 to 4 weeks ago    Pain Frequency Constant    Aggravating Factors  Prolonged standing/weight bearing/walking and standing    Pain Relieving Factors Rest/heat    Effect of Pain on Daily Activities Decreased activity tolerance                INTERVENTIONS:   BP: 110/63 mmHg (sitting)   BP: 111/71 mmHg (standing)   Pre-treatment cervical ROM-  Rotation (L/R)= 18/22 deg Lateral flex (L/R) = 10/14 deg  Manual Therapy:   PROM (Gentle in supine) to cervical spine in cervical flex/Rot/SB x 15 min.  STM to levator/UT region in supine and later seated position with increased tightness noted left side versus right. Patient able to tolerate moderate pressure with no report of increased pain.   Post treatment ROM:   Rotation (L/R)= 28/30deg Lateral flex (L/R) = 18/24 deg  Therapeutic exercise:  Instruction in gentle active ROM: Cervical Flex/ext/Rot/SB x 10 reps each 2-3x/day as able. Education provided throughout session via VC/TC and demonstration to facilitate movement at target joints and correct muscle activation for all testing and exercises performed.     Clinical Impression: Treatment focused on pain relief as patient reported being very sore after recent fall. She did respond well to manual therapy techniques- able to respond well to moderate pressure with STM and passive ROM for improved post treatment cervical motion and reported able to move her neck better. She would benefit from additional skilled PT Intervention to improve strength,  balance and mobility.                    PT Short Term Goals - 03/22/21 1713       PT SHORT TERM GOAL #1   Title Pt will be independent with HEP in order to improve strength and balance in order to decrease fall risk and improve function at home and work.    Baseline 10/14/2020 on-going/deferred; 10/28/2020 ongoing pt has resumed HEP, reports doing what she can/HEP to be advanced; 11/18/2020  Pt has had trouble doing HEP d/t fatigue.; 5/23: Pt reports independence    Time 6    Period Weeks    Status Achieved    Target Date 01/24/21  PT Long Term Goals - 04/28/21 9528       PT LONG TERM GOAL #1   Title Patient (< 22 years old) will complete five times sit to stand test in < 10 seconds indicating an increased LE strength and improved balance.    Baseline 12/29: 38.45s; 10/14/2020 42.8 seconds; 10/28/2020 36 seconds; 11/18/2020 34.84 seconds; 12/30/20 41.55sec; 5/23: 27.2 sec 7/19: 23 seconds; 04/28/2021= 16.70 sec with min UE support    Time 12    Period Weeks    Status Partially Met    Target Date 07/21/21      PT LONG TERM GOAL #2   Title Pt will decrease TUG to below 14 seconds/decrease in order to demonstrate decreased fall risk.    Baseline 12/29: 32.94s; 10/14/2020 43.12 sec; 10/28/2020 34.12 seconds; 11/18/2020 26.5 sec; 12/30/20: 31sec c SPC; 5/23: 24.6 with SPC 7/19: 20.4 seconds. 04/28/2021= 18.83 sec using SPC    Time 12    Period Weeks    Status Partially Met    Target Date 07/21/21      PT LONG TERM GOAL #3   Title Pt will decrease DHI score by at least 18 points in order to demonstrate clinically significant reduction in disability    Baseline 12/29: 30; 10/14/2020 62, indicating severe handicap; 10/28/2020 74' 5/23: 72 indicating severe handicap 7/20: 62; 04/28/2021= 54 indicating improvement from severe perception to moderate perception of handicap.   Not reassessed, but reports no significant change/improvement with dizziness   Time 12    Period  Weeks    Status Partially Met    Target Date 07/21/21      PT LONG TERM GOAL #4   Title Patient will increase 10 meter walk test to >1.40ms as to improve gait speed for better community ambulation and to reduce fall risk.    Baseline 12/29: 0.37 m/s; 0.38 m/s; 2/10/08/2020 0.31 m/s 11/18/2020 0.34 m/s with SPC; 0.257m 12/30/20; 5/23: [facility].45 [facility]: 0.59 m/s with cane; 04/28/2021= 0.75 m/s using SPC    Time 12    Period Weeks    Status Partially Met      PT LONG TERM GOAL #5   Title Patient will increase FOTO score to equal to or greater than 64 to demonstrate statistically significant improvement in mobility and quality of life.    Baseline 12/29: 52; 10/14/2020 45; 10/28/2020 45; 11/18/2020 45; 12/30/20 44; 5/23: 51 7/20: 39%; 04/28/2021= 51%    Time 12    Period Weeks    Status On-going    Target Date 07/21/21      Additional Long Term Goals   Additional Long Term Goals Yes      PT LONG TERM GOAL #6   Title Patient will increase six minute walk test distance to >1000 for progression to community ambulator and improve gait ability    Baseline 6[facility]: 620 ft limited by pain; 04/28/2021= 760 feet with SPC    Time 12    Period Weeks    Status On-going    Target Date 07/21/21      PT LONG TERM GOAL #7   Title Patient will ascend/descend 2- 3 steps with 1 rail and use of SPC with modified Independence in front of house to safely enter/exit home.    Baseline 04/28/2021 - Patient currently utilizing back steps (2) secondary to fear of falling using deeper steps in front of house.  Plan - 05/11/21 0920     Clinical Impression Statement Treatment focused on pain relief as patient reported being very sore after recent fall. She did respond well to manual therapy techniques- able to respond well to moderate pressure with STM and passive ROM for improved post treatment cervical motion and reported able to move her neck better. She would benefit  from additional skilled PT Intervention to improve strength, balance and mobility.    Personal Factors and Comorbidities Comorbidity 3+;Time since onset of injury/illness/exacerbation    Comorbidities HYPOtension, paraparesis, SCI without spinal bone injury, DDD, cervical cancer, acute kidney injury, GERD, asthma, bradycardia, L total hip replacement, hypokalemia    Examination-Activity Limitations Squat;Lift;Stairs;Bend;Stand;Reach Overhead;Carry;Transfers    Examination-Participation Restrictions Cleaning;Laundry;Meal Prep;Community Activity;Driving;Occupation    Stability/Clinical Decision Making Evolving/Moderate complexity    Rehab Potential Fair    PT Frequency Other (comment)   1x/every other week   PT Duration 12 weeks    PT Treatment/Interventions ADLs/Self Care Home Management;Aquatic Therapy;Gait training;Stair training;Functional mobility training;Therapeutic activities;Therapeutic exercise;Balance training;Moist Heat;Manual techniques;Patient/family education;Neuromuscular re-education;Passive range of motion;Energy conservation;Vestibular;Joint Manipulations;Spinal Manipulations    PT Next Visit Plan endurance, dynamic and static balance,endurance, treadmill, leg press.Patient has 7 visits left per ins and will plan for 1x/week every other week through end of cert as needed to achieve goals.    PT Home Exercise Plan Instructed in gentle cervical ROM activities.    Consulted and Agree with Plan of Care Patient             Patient will benefit from skilled therapeutic intervention in order to improve the following deficits and impairments:  Abnormal gait, Dizziness, Improper body mechanics, Decreased mobility, Cardiopulmonary status limiting activity, Decreased activity tolerance, Decreased endurance, Decreased range of motion, Decreased strength, Decreased balance, Decreased safety awareness, Difficulty walking, Impaired flexibility, Obesity  Visit Diagnosis: Abnormality of gait  and mobility  Difficulty in walking, not elsewhere classified  Muscle weakness (generalized)  Other lack of coordination     Problem List Patient Active Problem List   Diagnosis Date Noted   Abnormality of gait 04/29/2021   Abnormal sensation in both ears 04/06/2021   Other adverse food reactions, not elsewhere classified, subsequent encounter 04/06/2021   Multiple drug allergies 04/06/2021   Insomnia due to medical condition 02/25/2021   Myofascial muscle pain 02/25/2021   Nerve pain 10/18/2020   Incomplete paraplegia ([place]) 10/18/2020   Orthostatic hypotension 10/18/2020   Chronic migraine without aura without status migrainosus, not intractable    Hypokalemia    Adjustment reaction with anxiety and depression    Spinal cord injury, lumbar, without spinal bone injury, sequela ([place]) 08/18/2020   Paraparesis ([place])    Post-operative pain    Hypotension    Slow transit constipation    AKI (acute kidney injury) ([place])    Right leg weakness 08/11/2020   DDD (degenerative disc disease), lumbar 09/02/2019    Class: Chronic   Degenerative disc disease, lumbar 09/02/2019   Chest tightness 09/23/2018   Bradycardia 09/23/2018   Labile blood pressure 03/08/2018   Chronic low back pain 09/03/2017   History of total hip replacement, left 01/26/2016   EDEMA 04/28/2008   LEG PAIN, BILATERAL 12/16/2007   CERVICAL CANCER 07/02/2007   MORBID OBESITY 07/02/2007   DEPRESSION 07/02/2007   COMMON MIGRAINE 07/02/2007   Other allergic rhinitis 07/02/2007   ASTHMA 07/02/2007   GERD 07/02/2007   ELEVATED BLOOD PRESSURE WITHOUT DIAGNOSIS OF HYPERTENSION 07/02/2007    Lewis Moccasin, PT 05/12/2021, 11:12 AM  [place]  [place] MAIN [facility] SERVICES [address] [place], [place], [address] Phone: [phone]   Fax:  [phone]  Name: Vanessa Medina MRN: [id number] Date of Birth: June 05, 1971

## 2021-05-12 ENCOUNTER — Ambulatory Visit: Payer: 59 | Admitting: Physical Therapy

## 2021-05-16 ENCOUNTER — Ambulatory Visit: Payer: 59

## 2021-05-17 ENCOUNTER — Other Ambulatory Visit: Payer: Self-pay

## 2021-05-17 ENCOUNTER — Ambulatory Visit: Payer: 59

## 2021-05-17 DIAGNOSIS — R269 Unspecified abnormalities of gait and mobility: Secondary | ICD-10-CM | POA: Diagnosis not present

## 2021-05-17 DIAGNOSIS — M6281 Muscle weakness (generalized): Secondary | ICD-10-CM

## 2021-05-17 DIAGNOSIS — R262 Difficulty in walking, not elsewhere classified: Secondary | ICD-10-CM

## 2021-05-17 NOTE — Therapy (Signed)
[facility] SERVICES [place], [place], [address] Phone: [phone]   Fax:  [phone]  Physical Therapy Treatment  Patient Details  Name: Vanessa Medina MRN: [id number] Date of Birth: [birth year] Referring Provider (PT): Dr. Erling Cruz   Encounter Date: 05/17/2021   PT End of Session - 05/17/21 1642     Visit Number 2    Number of Visits 29    Date for PT Re-Evaluation 07/22/21    Authorization Type U[employer] Other    Authorization Time Period 01/24/21-04/18/2021    PT Start Time 1645    PT Stop Time 1728    PT Time Calculation (min) 43 min    Equipment Utilized During Treatment Gait belt    Activity Tolerance Patient tolerated treatment well    Behavior During Therapy WFL for tasks assessed/performed             Past Medical History:  Diagnosis Date   Anemia    Cervical cancer ([place])    Family history of adverse reaction to anesthesia     " my mother takes a long time time wake up"   GERD (gastroesophageal reflux disease)    Headache    migraine   History of shingles    Hypertension    Migraine    Multiple allergies    Obesity    Osteoarthritis    left hip   PONV (postoperative nausea and vomiting)    Sleep apnea    does not wear CPAP   Stroke ([place]) 08/11/2020   Wears glasses     Past Surgical History:  Procedure Laterality Date   ABDOMINAL HYSTERECTOMY     APPENDECTOMY     BILIOPANCREATIC DIVERSION     with duodenal switch laparoscopic    CHOLECYSTECTOMY     DIAGNOSTIC LAPAROSCOPY     LOA   ESOPHAGOGASTRODUODENOSCOPY (EGD) WITH PROPOFOL N/A 07/25/2017   Procedure: ESOPHAGOGASTRODUODENOSCOPY (EGD) WITH PROPOFOL;  Surgeon: Manya Silvas, MD;  Location: [facility] ENDOSCOPY;  Service: Endoscopy;  Laterality: N/A;   KNEE ARTHROSCOPY     LAPAROSCOPIC GASTRIC SLEEVE RESECTION WITH HIATAL HERNIA REPAIR     TONSILLECTOMY     TOTAL HIP ARTHROPLASTY Left 01/26/2016   Procedure: LEFT TOTAL HIP ARTHROPLASTY  ANTERIOR APPROACH;  Surgeon: Leandrew Koyanagi, MD;  Location: [place];  Service: Orthopedics;  Laterality: Left;   TRANSFORAMINAL LUMBAR INTERBODY FUSION (TLIF) WITH PEDICLE SCREW FIXATION 3 LEVEL  08/2019   Basil Dess, MD L2-L5    There were no vitals filed for this visit.   Subjective Assessment - 05/17/21 1649     Subjective Patient reports she is still having a lot of discomfort in her low back. Reports the doctors say her rods are still in the same place. Is having L hip pain still.    Pertinent History 50 year old female with history of HTN, migraines, morbid obesity--BMI-41, chronic hypotension, multiple back surgeries with chronic pain with LLE lumbar radiculopathy who was scheduled to have spinal cord stimulator placed by Dr. Davy Pique on 08/11/2020 but unable to perform surgery due to scar tissue.  History taken from patient and chart review.  Post op on awakening, she had excruciating pain RLE with plegia and and allodynia. She was admitted for work up by Dr. Ronnald Ramp from surgical center on 08/11/2020. Thoracic MRI done revealing subtle increased T2/STIR intensity distal cord at T12-L1 level suspicious for edema/acute spinal cord injury, no CSF leak as well as prior PLIF L2-L5 with  residual foraminal protrusion L3/5 potentially irritating right L3 nerve. She was started on IV Dilaudid, gabapentin as well as IV Decadron for management of pain, headaches and severe neuropathy.  She continues to have pain in RLE but is having some motor return, has constant headache, decreased hearing in right> left ear, constipation with nausea as well as epigastric pain and weakness. Therapy ongoing and CIR recommended due to functional decline.  Of note, she reports 51 lbs weigh loss in the past 3-4 months--due to issues with N/V/intake. Has had two falls last month---no recall of incident leading to fall question due to syncope. Was in process of work up for renal disease. She has had issue with LLE weakness with pain for  the past year and being followed by Dr. Ernestina Patches. Did well with stimulator trial.    Currently in Pain? Yes    Pain Score 7     Pain Location Head    Pain Orientation Left    Pain Descriptors / Indicators Aching;Sharp    Pain Type Acute pain    Pain Onset 1 to 4 weeks ago    Pain Frequency Constant                 TREATMENT:   Supine: GTB around bilateral LE:  -abduction 20x with cues for arc of motion  -alternating hip flexion 15x each LE  Heel slides 10x each LE, painful to RLE requiring stabilization to knee.  Posterior pelvic tilt with adductoin ball squeeze 10x 3 second holds  Seated: Swiss TrA activation 10x 3 second holds  Manual:  PROM (Gentle in supine) to cervical spine in cervical flex/Rot/SB x 15 min.  STM to levator/UT region in supine and later seated position with increased tightness noted left side versus right. Patient able to tolerate moderate pressure with no report of increased pain.     Pt educated throughout session about proper posture and technique with exercises. Improved exercise technique, movement at target joints, use of target muscles after min to mod verbal, visual, tactile cues.   Patient educated on gentle safe strengthening and manual with patient reporting relief of symptoms by end of session. Lower abdominal and pelvic strengthening will benefit patient in future sessions. Patient unable to tolerate standing interventions yet but will attempt next session if tolerable. She would benefit from additional skilled PT Intervention to improve strength, balance and mobility                     PT Education - 05/17/21 1642     Education Details exercise technique, body mechanics    Person(s) Educated Patient    Methods Explanation;Demonstration;Tactile cues;Verbal cues    Comprehension Verbalized understanding;Returned demonstration;Verbal cues required;Tactile cues required              PT Short Term Goals - 03/22/21  1713       PT SHORT TERM GOAL #1   Title Pt will be independent with HEP in order to improve strength and balance in order to decrease fall risk and improve function at home and work.    Baseline 10/14/2020 on-going/deferred; 10/28/2020 ongoing pt has resumed HEP, reports doing what she can/HEP to be advanced; 11/18/2020  Pt has had trouble doing HEP d/t fatigue.; 5/23: Pt reports independence    Time 6    Period Weeks    Status Achieved    Target Date 01/24/21               PT  Long Term Goals - 04/28/21 6283       PT LONG TERM GOAL #1   Title Patient (< 70 years old) will complete five times sit to stand test in < 10 seconds indicating an increased LE strength and improved balance.    Baseline 12/29: 38.45s; 10/14/2020 42.8 seconds; 10/28/2020 36 seconds; 11/18/2020 34.84 seconds; 12/30/20 41.55sec; 5/23: 27.2 sec 7/19: 23 seconds; 04/28/2021= 16.70 sec with min UE support    Time 12    Period Weeks    Status Partially Met    Target Date 07/21/21      PT LONG TERM GOAL #2   Title Pt will decrease TUG to below 14 seconds/decrease in order to demonstrate decreased fall risk.    Baseline 12/29: 32.94s; 10/14/2020 43.12 sec; 10/28/2020 34.12 seconds; 11/18/2020 26.5 sec; 12/30/20: 31sec c SPC; 5/23: 24.6 with SPC 7/19: 20.4 seconds. 04/28/2021= 18.83 sec using SPC    Time 12    Period Weeks    Status Partially Met    Target Date 07/21/21      PT LONG TERM GOAL #3   Title Pt will decrease DHI score by at least 18 points in order to demonstrate clinically significant reduction in disability    Baseline 12/29: 30; 10/14/2020 62, indicating severe handicap; 10/28/2020 74' 5/23: 72 indicating severe handicap 7/20: 62; 04/28/2021= 54 indicating improvement from severe perception to moderate perception of handicap.   Not reassessed, but reports no significant change/improvement with dizziness   Time 12    Period Weeks    Status Partially Met    Target Date 07/21/21      PT LONG TERM GOAL #4    Title Patient will increase 10 meter walk test to >1.50ms as to improve gait speed for better community ambulation and to reduce fall risk.    Baseline 12/29: 0.37 m/s; 0.38 m/s; 2/10/08/2020 0.31 m/s 11/18/2020 0.34 m/s with SPC; 0.257m 12/30/20; 5/23: [facility].45 [facility]: 0.59 m/s with cane; 04/28/2021= 0.75 m/s using SPC    Time 12    Period Weeks    Status Partially Met      PT LONG TERM GOAL #5   Title Patient will increase FOTO score to equal to or greater than 64 to demonstrate statistically significant improvement in mobility and quality of life.    Baseline 12/29: 52; 10/14/2020 45; 10/28/2020 45; 11/18/2020 45; 12/30/20 44; 5/23: 51 7/20: 39%; 04/28/2021= 51%    Time 12    Period Weeks    Status On-going    Target Date 07/21/21      Additional Long Term Goals   Additional Long Term Goals Yes      PT LONG TERM GOAL #6   Title Patient will increase six minute walk test distance to >1000 for progression to community ambulator and improve gait ability    [facility] 620 ft limited by pain; 04/28/2021= 760 feet with SPC    Time 12    Period Weeks    Status On-going    Target Date 07/21/21      PT LONG TERM GOAL #7   Title Patient will ascend/descend 2- 3 steps with 1 rail and use of SPC with modified [place] in front of house to safely enter/exit home.    Baseline 04/28/2021 - Patient currently utilizing back steps (2) secondary to fear of falling using deeper steps in front of house.  Plan - 05/17/21 1737     Clinical Impression Statement Patient educated on gentle safe strengthening and manual with patient reporting relief of symptoms by end of session. Lower abdominal and pelvic strengthening will benefit patient in future sessions. Patient unable to tolerate standing interventions yet but will attempt next session if tolerable. She would benefit from additional skilled PT Intervention to improve strength, balance and mobility     Personal Factors and Comorbidities Comorbidity 3+;Time since onset of injury/illness/exacerbation    Comorbidities HYPOtension, paraparesis, SCI without spinal bone injury, DDD, cervical cancer, acute kidney injury, GERD, asthma, bradycardia, L total hip replacement, hypokalemia    Examination-Activity Limitations Squat;Lift;Stairs;Bend;Stand;Reach Overhead;Carry;Transfers    Examination-Participation Restrictions Cleaning;Laundry;Meal Prep;Community Activity;Driving;Occupation    Stability/Clinical Decision Making Evolving/Moderate complexity    Rehab Potential Fair    PT Frequency Other (comment)   1x/every other week   PT Duration 12 weeks    PT Treatment/Interventions ADLs/Self Care Home Management;Aquatic Therapy;Gait training;Stair training;Functional mobility training;Therapeutic activities;Therapeutic exercise;Balance training;Moist Heat;Manual techniques;Patient/family education;Neuromuscular re-education;Passive range of motion;Energy conservation;Vestibular;Joint Manipulations;Spinal Manipulations    PT Next Visit Plan endurance, dynamic and static balance,endurance, treadmill, leg press.Patient has 7 visits left per ins and will plan for 1x/week every other week through end of cert as needed to achieve goals.    PT Home Exercise Plan Instructed in gentle cervical ROM activities.    Consulted and Agree with Plan of Care Patient             Patient will benefit from skilled therapeutic intervention in order to improve the following deficits and impairments:  Abnormal gait, Dizziness, Improper body mechanics, Decreased mobility, Cardiopulmonary status limiting activity, Decreased activity tolerance, Decreased endurance, Decreased range of motion, Decreased strength, Decreased balance, Decreased safety awareness, Difficulty walking, Impaired flexibility, Obesity  Visit Diagnosis: Abnormality of gait and mobility  Difficulty in walking, not elsewhere classified  Muscle weakness  (generalized)     Problem List Patient Active Problem List   Diagnosis Date Noted   Abnormality of gait 04/29/2021   Abnormal sensation in both ears 04/06/2021   Other adverse food reactions, not elsewhere classified, subsequent encounter 04/06/2021   Multiple drug allergies 04/06/2021   Insomnia due to medical condition 02/25/2021   Myofascial muscle pain 02/25/2021   Nerve pain 10/18/2020   Incomplete paraplegia ([place]) 10/18/2020   Orthostatic hypotension 10/18/2020   Chronic migraine without aura without status migrainosus, not intractable    Hypokalemia    Adjustment reaction with anxiety and depression    Spinal cord injury, lumbar, without spinal bone injury, sequela ([place]) 08/18/2020   Paraparesis ([place])    Post-operative pain    Hypotension    Slow transit constipation    AKI (acute kidney injury) ([place])    Right leg weakness 08/11/2020   DDD (degenerative disc disease), lumbar 09/02/2019    Class: Chronic   Degenerative disc disease, lumbar 09/02/2019   Chest tightness 09/23/2018   Bradycardia 09/23/2018   Labile blood pressure 03/08/2018   Chronic low back pain 09/03/2017   History of total hip replacement, left 01/26/2016   EDEMA 04/28/2008   LEG PAIN, BILATERAL 12/16/2007   CERVICAL CANCER 07/02/2007   MORBID OBESITY 07/02/2007   DEPRESSION 07/02/2007   COMMON MIGRAINE 07/02/2007   Other allergic rhinitis 07/02/2007   ASTHMA 07/02/2007   GERD 07/02/2007   ELEVATED BLOOD PRESSURE WITHOUT DIAGNOSIS OF HYPERTENSION 07/02/2007    Janna Arch, PT, DPT  05/17/2021, 5:38 PM  [facility] [address]  [place], [place], [address] Phone: [phone]   Fax:  [phone]  Name: ELISANDRA DESHMUKH MRN: [id number] Date of Birth: [birth year]

## 2021-05-18 ENCOUNTER — Ambulatory Visit: Payer: 59 | Admitting: Physical Therapy

## 2021-05-23 ENCOUNTER — Ambulatory Visit: Payer: 59

## 2021-05-25 ENCOUNTER — Ambulatory Visit: Payer: 59

## 2021-05-27 ENCOUNTER — Telehealth: Payer: Self-pay | Admitting: Specialist

## 2021-05-27 NOTE — Telephone Encounter (Signed)
Pt had to get rescheduled and would like to be worked in.

## 2021-05-30 ENCOUNTER — Ambulatory Visit: Payer: 59 | Admitting: Specialist

## 2021-05-30 ENCOUNTER — Ambulatory Visit: Payer: 59

## 2021-05-31 NOTE — Telephone Encounter (Signed)
I called and advised that I have added her to the cancellation list and will call if something opens up.

## 2021-06-01 ENCOUNTER — Ambulatory Visit: Payer: 59

## 2021-06-01 ENCOUNTER — Other Ambulatory Visit: Payer: Self-pay

## 2021-06-01 ENCOUNTER — Ambulatory Visit (INDEPENDENT_AMBULATORY_CARE_PROVIDER_SITE_OTHER): Payer: 59 | Admitting: Dermatology

## 2021-06-01 DIAGNOSIS — T148XXA Other injury of unspecified body region, initial encounter: Secondary | ICD-10-CM

## 2021-06-01 DIAGNOSIS — L578 Other skin changes due to chronic exposure to nonionizing radiation: Secondary | ICD-10-CM | POA: Diagnosis not present

## 2021-06-01 DIAGNOSIS — D1800 Hemangioma unspecified site: Secondary | ICD-10-CM

## 2021-06-01 DIAGNOSIS — L821 Other seborrheic keratosis: Secondary | ICD-10-CM

## 2021-06-01 DIAGNOSIS — D229 Melanocytic nevi, unspecified: Secondary | ICD-10-CM

## 2021-06-01 DIAGNOSIS — Z1283 Encounter for screening for malignant neoplasm of skin: Secondary | ICD-10-CM

## 2021-06-01 DIAGNOSIS — L814 Other melanin hyperpigmentation: Secondary | ICD-10-CM

## 2021-06-01 DIAGNOSIS — S40812A Abrasion of left upper arm, initial encounter: Secondary | ICD-10-CM | POA: Diagnosis not present

## 2021-06-01 NOTE — Progress Notes (Signed)
   Follow-Up Visit   Subjective  Vanessa Medina is a 50 y.o. female who presents for the following: FBSE (Patient here for full body skin exam and skin cancer screening. Patient not aware of any new or changing spots. ).  Patient advises she has had a biopsy done and it would have gone though [facility]. There is a pending pathology from 2019 in [facility] portal. Will call [facility] to see if we can get results.   The following portions of the chart were reviewed this encounter and updated as appropriate:   Tobacco  Allergies  Meds  Problems  Med Hx  Surg Hx  Fam Hx      Review of Systems:  No other skin or systemic complaints except as noted in HPI or Assessment and Plan.  Objective  Well appearing patient in no apparent distress; mood and affect are within normal limits.  A full examination was performed including scalp, head, eyes, ears, nose, lips, neck, chest, axillae, abdomen, back, buttocks, bilateral upper extremities, bilateral lower extremities, hands, feet, fingers, toes, fingernails, and toenails. All findings within normal limits unless otherwise noted below.  Left Upper Arm Excoriation    Assessment & Plan  Excoriation Left Upper Arm  Start vaseline. Recheck on follow up.   Benign-appearing. Call if worsening    Lentigines - Scattered tan macules - Due to sun exposure - Benign-appearing, observe - Recommend daily broad spectrum sunscreen SPF 30+ to sun-exposed areas, reapply every 2 hours as needed. - Call for any changes  Seborrheic Keratoses - Stuck-on, waxy, tan-brown papules and/or plaques  - Benign-appearing - Discussed benign etiology and prognosis. - Observe - Call for any changes  Melanocytic Nevi - Tan-brown and/or pink-flesh-colored symmetric macules and papules - Benign appearing on exam today - Observation - Call clinic for new or changing moles - Recommend daily use of broad spectrum spf 30+ sunscreen to sun-exposed areas.    Hemangiomas - Red papules - Discussed benign nature - Observe - Call for any changes  Actinic Damage - Chronic condition, secondary to cumulative UV/sun exposure - diffuse scaly erythematous macules with underlying dyspigmentation - Recommend daily broad spectrum sunscreen SPF 30+ to sun-exposed areas, reapply every 2 hours as needed.  - Staying in the shade or wearing long sleeves, sun glasses (UVA+UVB protection) and wide brim hats (4-inch brim around the entire circumference of the hat) are also recommended for sun protection.  - Call for new or changing lesions.  Skin cancer screening performed today.  Return in about 1 year (around 06/01/2022) for TBSE.  Graciella Belton, RMA, am acting as scribe for Forest Gleason, MD .  Documentation: I have reviewed the above documentation for accuracy and completeness, and I agree with the above.  Forest Gleason, MD

## 2021-06-01 NOTE — Patient Instructions (Signed)
Melanoma ABCDEs  Melanoma is the most dangerous type of skin cancer, and is the leading cause of death from skin disease.  You are more likely to develop melanoma if you: Have light-colored skin, light-colored eyes, or red or blond hair Spend a lot of time in the sun Tan regularly, either outdoors or in a tanning bed Have had blistering sunburns, especially during childhood Have a close family member who has had a melanoma Have atypical moles or large birthmarks  Early detection of melanoma is key since treatment is typically straightforward and cure rates are extremely high if we catch it early.   The first sign of melanoma is often a change in a mole or a new dark spot.  The ABCDE system is a way of remembering the signs of melanoma.  A for asymmetry:  The two halves do not match. B for border:  The edges of the growth are irregular. C for color:  A mixture of colors are present instead of an even brown color. D for diameter:  Melanomas are usually (but not always) greater than 6mm - the size of a pencil eraser. E for evolution:  The spot keeps changing in size, shape, and color.  Please check your skin once per month between visits. You can use a small mirror in front and a large mirror behind you to keep an eye on the back side or your body.   If you see any new or changing lesions before your next follow-up, please call to schedule a visit.  Please continue daily skin protection including broad spectrum sunscreen SPF 30+ to sun-exposed areas, reapplying every 2 hours as needed when you're outdoors.    If you have any questions or concerns for your doctor, please call our main line at [phone] and press option 4 to reach your doctor's medical assistant. If no one answers, please leave a voicemail as directed and we will return your call as soon as possible. Messages left after 4 pm will be answered the following business day.   You may also send us a message via MyChart. We  typically respond to MyChart messages within 1-2 business days.  For prescription refills, please ask your pharmacy to contact our office. Our fax number is [phone].  If you have an urgent issue when the clinic is closed that cannot wait until the next business day, you can page your doctor at the number below.    Please note that while we do our best to be available for urgent issues outside of office hours, we are not available 24/7.   If you have an urgent issue and are unable to reach us, you may choose to seek medical care at your doctor's office, retail clinic, urgent care center, or emergency room.  If you have a medical emergency, please immediately call 911 or go to the emergency department.  Pager Numbers  - Dr. Kowalski: [phone]  - Dr. Moye: [phone]  - Dr. Stewart: [phone]  In the event of inclement weather, please call our main line at [phone] for an update on the status of any delays or closures.  Dermatology Medication Tips: Please keep the boxes that topical medications come in in order to help keep track of the instructions about where and how to use these. Pharmacies typically print the medication instructions only on the boxes and not directly on the medication tubes.   If your medication is too expensive, please contact our office at [phone] option 4 or send   us a message through MyChart.   We are unable to tell what your co-pay for medications will be in advance as this is different depending on your insurance coverage. However, we may be able to find a substitute medication at lower cost or fill out paperwork to get insurance to cover a needed medication.   If a prior authorization is required to get your medication covered by your insurance company, please allow us 1-2 business days to complete this process.  Drug prices often vary depending on where the prescription is filled and some pharmacies may offer cheaper prices.  The  website www.goodrx.com contains coupons for medications through different pharmacies. The prices here do not account for what the cost may be with help from insurance (it may be cheaper with your insurance), but the website can give you the price if you did not use any insurance.  - You can print the associated coupon and take it with your prescription to the pharmacy.  - You may also stop by our office during regular business hours and pick up a GoodRx coupon card.  - If you need your prescription sent electronically to a different pharmacy, notify our office through [facility] MyChart or by phone at [phone] option 4.  

## 2021-06-06 ENCOUNTER — Ambulatory Visit: Payer: 59

## 2021-06-08 ENCOUNTER — Ambulatory Visit: Payer: 59

## 2021-06-08 ENCOUNTER — Other Ambulatory Visit: Payer: Self-pay | Admitting: Obstetrics and Gynecology

## 2021-06-08 ENCOUNTER — Other Ambulatory Visit: Payer: Self-pay

## 2021-06-08 ENCOUNTER — Ambulatory Visit: Payer: 59 | Attending: Physical Medicine and Rehabilitation

## 2021-06-08 ENCOUNTER — Other Ambulatory Visit: Payer: Self-pay | Admitting: Physical Medicine and Rehabilitation

## 2021-06-08 DIAGNOSIS — R2681 Unsteadiness on feet: Secondary | ICD-10-CM | POA: Insufficient documentation

## 2021-06-08 DIAGNOSIS — N63 Unspecified lump in unspecified breast: Secondary | ICD-10-CM

## 2021-06-08 DIAGNOSIS — M6281 Muscle weakness (generalized): Secondary | ICD-10-CM | POA: Insufficient documentation

## 2021-06-08 DIAGNOSIS — R278 Other lack of coordination: Secondary | ICD-10-CM | POA: Insufficient documentation

## 2021-06-08 DIAGNOSIS — M545 Low back pain, unspecified: Secondary | ICD-10-CM | POA: Insufficient documentation

## 2021-06-08 DIAGNOSIS — R269 Unspecified abnormalities of gait and mobility: Secondary | ICD-10-CM | POA: Insufficient documentation

## 2021-06-08 DIAGNOSIS — R262 Difficulty in walking, not elsewhere classified: Secondary | ICD-10-CM | POA: Insufficient documentation

## 2021-06-08 DIAGNOSIS — G8929 Other chronic pain: Secondary | ICD-10-CM | POA: Insufficient documentation

## 2021-06-08 DIAGNOSIS — R2689 Other abnormalities of gait and mobility: Secondary | ICD-10-CM | POA: Diagnosis present

## 2021-06-08 NOTE — Therapy (Signed)
[place] MAIN [facility] SERVICES [place], [place], [address] Phone: [phone]   Fax:  [phone]  Physical Therapy Treatment  Patient Details  Name: Vanessa Medina MRN: [id number] Date of Birth: December 21, 1970 Referring Provider (PT): Dr. Erling Cruz   Encounter Date: 06/08/2021   PT End of Session - 06/09/21 0823     Visit Number 7    Number of Visits 60    Date for PT Re-Evaluation 07/22/21    Authorization Type UHC Other    Authorization Time Period 01/24/21-04/18/2021    PT Start Time 1302    PT Stop Time 1346    PT Time Calculation (min) 44 min    Equipment Utilized During Treatment Gait belt    Activity Tolerance Patient tolerated treatment well    Behavior During Therapy [facility] for tasks assessed/performed             Past Medical History:  Diagnosis Date   Anemia    Cervical cancer ([place])    Family history of adverse reaction to anesthesia     " my mother takes a long time time wake up"   GERD (gastroesophageal reflux disease)    Headache    migraine   History of dysplastic nevus 01/18/2018   right shoulder, recurrent dysplastic nevus   History of shingles    Hypertension    Migraine    Multiple allergies    Obesity    Osteoarthritis    left hip   PONV (postoperative nausea and vomiting)    Sleep apnea    does not wear CPAP   Stroke ([place]) 08/11/2020   Wears glasses     Past Surgical History:  Procedure Laterality Date   ABDOMINAL HYSTERECTOMY     APPENDECTOMY     BILIOPANCREATIC DIVERSION     with duodenal switch laparoscopic    CHOLECYSTECTOMY     DIAGNOSTIC LAPAROSCOPY     LOA   ESOPHAGOGASTRODUODENOSCOPY (EGD) WITH PROPOFOL N/A 07/25/2017   Procedure: ESOPHAGOGASTRODUODENOSCOPY (EGD) WITH PROPOFOL;  Surgeon: Manya Silvas, MD;  Location: [facility] ENDOSCOPY;  Service: Endoscopy;  Laterality: N/A;   KNEE ARTHROSCOPY     LAPAROSCOPIC GASTRIC SLEEVE RESECTION WITH HIATAL HERNIA REPAIR      TONSILLECTOMY     TOTAL HIP ARTHROPLASTY Left 01/26/2016   Procedure: LEFT TOTAL HIP ARTHROPLASTY ANTERIOR APPROACH;  Surgeon: Leandrew Koyanagi, MD;  Location: [facility];  Service: Orthopedics;  Laterality: Left;   TRANSFORAMINAL LUMBAR INTERBODY FUSION (TLIF) WITH PEDICLE SCREW FIXATION 3 LEVEL  08/2019   Basil Dess, MD L2-L5    There were no vitals filed for this visit. Interventions  Subjective Assessment - 06/08/21 1304     Subjective Pt reports low back pain. Her next doctor's appointment is on October 31st, where she plans to discuss lbp. She reports another fall since previous session. Pt reports she has not been able to walk around much d/t muscle spasms. She reports very poor sleep.    Pertinent History 50 year old female with history of HTN, migraines, morbid obesity--BMI-41, chronic hypotension, multiple back surgeries with chronic pain with LLE lumbar radiculopathy who was scheduled to have spinal cord stimulator placed by Dr. Davy Pique on 08/11/2020 but unable to perform surgery due to scar tissue.  History taken from patient and chart review.  Post op on awakening, she had excruciating pain RLE with plegia and and allodynia. She was admitted for work up by Dr. Ronnald Ramp from surgical center on 08/11/2020. Thoracic MRI  done revealing subtle increased T2/STIR intensity distal cord at T12-L1 level suspicious for edema/acute spinal cord injury, no CSF leak as well as prior PLIF L2-L5 with residual foraminal protrusion L3/5 potentially irritating right L3 nerve. She was started on IV Dilaudid, gabapentin as well as IV Decadron for management of pain, headaches and severe neuropathy.  She continues to have pain in RLE but is having some motor return, has constant headache, decreased hearing in right> left ear, constipation with nausea as well as epigastric pain and weakness. Therapy ongoing and CIR recommended due to functional decline.  Of note, she reports 50 lbs weigh loss in the past 3-4 months--due to  issues with N/V/intake. Has had two falls last month---no recall of incident leading to fall question due to syncope. Was in process of work up for renal disease. She has had issue with LLE weakness with pain for the past year and being followed by Dr. Ernestina Patches. Did well with stimulator trial.    Currently in Pain? Yes    Pain Location Back    Pain Orientation Lower    Pain Onset 1 to 4 weeks ago            TREATMENT:   BP assessed: Seated 96/65 mmHg HR 65 bpm  Standing 113/64 mmHg 75 bpm  Seated: P ball roll outs forward and backward 15 times with 3-5-second holds P ball roll outs side to side 15 times with 3 to 5-second holds  Supine on plinth: PT places hot pack to patient left side low back for pain modulation while pt supine x 25 minutes while pt performs interventions.  Skin checked prior to placement of hot pack and appears WNL, skin checked throughout and once hot pack removed and skin appears to remain WNL. Pt reports improvement of sx with heat.  Bolster placed under bilateral lower extremities to support pelvis/low back in a more flexed position for pain relief. Patient performs short arc quads with bolster under knee -multiple repetitions each lower extremity PT assists patient in performing LTRs 3x15-20 Patient performs supine marches 2 x 20 Patient performs 15 glutes squeezes --Progresses to performed with TA activation Patient performs pelvic tilts with TA activation PT providing cueing for technique throughout.  All exercises performed within pain-free or pain limited range.  Patient reports improvement in pain at end of session.  Pt educated throughout session about proper posture and technique with exercises. Improved exercise technique, movement at target joints, use of target muscles after min to mod verbal, visual, tactile cues.       PT Education - 06/09/21 0820     Education Details Exercise technique, body mechanics    Person(s) Educated Patient     Methods Explanation;Demonstration;Tactile cues;Verbal cues    Comprehension Verbalized understanding;Returned demonstration              PT Short Term Goals - 03/22/21 1713       PT SHORT TERM GOAL #1   Title Pt will be independent with HEP in order to improve strength and balance in order to decrease fall risk and improve function at home and work.    Baseline 10/14/2020 on-going/deferred; 10/28/2020 ongoing pt has resumed HEP, reports doing what she can/HEP to be advanced; 11/18/2020  Pt has had trouble doing HEP d/t fatigue.; 5/23: Pt reports independence    Time 6    Period Weeks    Status Achieved    Target Date 01/24/21  PT Long Term Goals - 04/28/21 1833       PT LONG TERM GOAL #1   Title Patient (< 35 years old) will complete five times sit to stand test in < 10 seconds indicating an increased LE strength and improved balance.    Baseline 12/29: 38.45s; 10/14/2020 42.8 seconds; 10/28/2020 36 seconds; 11/18/2020 34.84 seconds; 12/30/20 41.55sec; 5/23: 27.2 sec 7/19: 23 seconds; 04/28/2021= 16.70 sec with min UE support    Time 12    Period Weeks    Status Partially Met    Target Date 07/21/21      PT LONG TERM GOAL #2   Title Pt will decrease TUG to below 14 seconds/decrease in order to demonstrate decreased fall risk.    Baseline 12/29: 32.94s; 10/14/2020 43.12 sec; 10/28/2020 34.12 seconds; 11/18/2020 26.5 sec; 12/30/20: 31sec c SPC; 5/23: 24.6 with SPC 7/19: 20.4 seconds. 04/28/2021= 18.83 sec using SPC    Time 12    Period Weeks    Status Partially Met    Target Date 07/21/21      PT LONG TERM GOAL #3   Title Pt will decrease DHI score by at least 18 points in order to demonstrate clinically significant reduction in disability    Baseline 12/29: 30; 10/14/2020 62, indicating severe handicap; 10/28/2020 74' 5/23: 72 indicating severe handicap 7/20: 62; 04/28/2021= 54 indicating improvement from severe perception to moderate perception of handicap.   Not  reassessed, but reports no significant change/improvement with dizziness   Time 12    Period Weeks    Status Partially Met    Target Date 07/21/21      PT LONG TERM GOAL #4   Title Patient will increase 10 meter walk test to >1.37ms as to improve gait speed for better community ambulation and to reduce fall risk.    Baseline 12/29: 0.37 m/s; 0.38 m/s; 2/10/08/2020 0.31 m/s 11/18/2020 0.34 m/s with SPC; 0.287m 12/30/20; 5/23: [facility]: 0.59 m/s with cane; 04/28/2021= 0.75 m/s using SPC    Time 12    Period Weeks    Status Partially Met      PT LONG TERM GOAL #5   Title Patient will increase FOTO score to equal to or greater than 64 to demonstrate statistically significant improvement in mobility and quality of life.    Baseline 12/29: 52; 10/14/2020 45; 10/28/2020 45; 11/18/2020 45; 12/30/20 44; 5/23: 51 7/20: 39%; 04/28/2021= 51%    Time 12    Period Weeks    Status On-going    Target Date 07/21/21      Additional Long Term Goals   Additional Long Term Goals Yes      PT LONG TERM GOAL #6   Title Patient will increase six minute walk test distance to >1000 for progression to community ambulator and improve gait ability    [facility] 6[facility]: 620 ft limited by pain; 04/28/2021= 760 feet with SPC    Time 12    Period Weeks    Status On-going    Target Date 07/21/21      PT LONG TERM GOAL #7   Title Patient will ascend/descend 2- 3 steps with 1 rail and use of SPC with modified [place] in front of house to safely enter/exit home.    Baseline 04/28/2021 - Patient currently utilizing back steps (2) secondary to fear of falling using deeper steps in front of house.  Plan - 06/09/21 0825     Clinical Impression Statement Continued focus on gentle strengthening in supine position with pain modulatory techniques.  Patient instructed in pelvic tilts and TA activation.  Patient did report improvement in back spasm symptoms by end of  session.  All interventions performed within patient's pain tolerance/pain minimal range.  Patient highly motivated and reports she does hope to get back to upright balance and strengthening activities as soon as she is able.  The patient will benefit from further skilled PT to improve strength, balance, and mobility.    Personal Factors and Comorbidities Comorbidity 3+;Time since onset of injury/illness/exacerbation    Comorbidities HYPOtension, paraparesis, SCI without spinal bone injury, DDD, cervical cancer, acute kidney injury, GERD, asthma, bradycardia, L total hip replacement, hypokalemia    Examination-Activity Limitations Squat;Lift;Stairs;Bend;Stand;Reach Overhead;Carry;Transfers    Examination-Participation Restrictions Cleaning;Laundry;Meal Prep;Community Activity;Driving;Occupation    Stability/Clinical Decision Making Evolving/Moderate complexity    Rehab Potential Fair    PT Frequency Other (comment)   1x/every other week   PT Duration 12 weeks    PT Treatment/Interventions ADLs/Self Care Home Management;Aquatic Therapy;Gait training;Stair training;Functional mobility training;Therapeutic activities;Therapeutic exercise;Balance training;Moist Heat;Manual techniques;Patient/family education;Neuromuscular re-education;Passive range of motion;Energy conservation;Vestibular;Joint Manipulations;Spinal Manipulations    PT Next Visit Plan endurance, dynamic and static balance,endurance, treadmill, leg press.Patient has 7 visits left per ins and will plan for 1x/week every other week through end of cert as needed to achieve goals.    PT Home Exercise Plan Instructed in gentle cervical ROM activities, progress strengthening exercises as patient is able    Consulted and Agree with Plan of Care Patient             Patient will benefit from skilled therapeutic intervention in order to improve the following deficits and impairments:  Abnormal gait, Dizziness, Improper body mechanics, Decreased  mobility, Cardiopulmonary status limiting activity, Decreased activity tolerance, Decreased endurance, Decreased range of motion, Decreased strength, Decreased balance, Decreased safety awareness, Difficulty walking, Impaired flexibility, Obesity  Visit Diagnosis: Chronic midline low back pain, unspecified whether sciatica present  Muscle weakness (generalized)  Other abnormalities of gait and mobility  Unsteadiness on feet     Problem List Patient Active Problem List   Diagnosis Date Noted   Abnormality of gait 04/29/2021   Abnormal sensation in both ears 04/06/2021   Other adverse food reactions, not elsewhere classified, subsequent encounter 04/06/2021   Multiple drug allergies 04/06/2021   Insomnia due to medical condition 02/25/2021   Myofascial muscle pain 02/25/2021   Nerve pain 10/18/2020   Incomplete paraplegia ([place]) 10/18/2020   Orthostatic hypotension 10/18/2020   Chronic migraine without aura without status migrainosus, not intractable    Hypokalemia    Adjustment reaction with anxiety and depression    Spinal cord injury, lumbar, without spinal bone injury, sequela ([place]) 08/18/2020   Paraparesis ([place])    Post-operative pain    Hypotension    Slow transit constipation    AKI (acute kidney injury) ([place])    Right leg weakness 08/11/2020   DDD (degenerative disc disease), lumbar 09/02/2019    Class: Chronic   Degenerative disc disease, lumbar 09/02/2019   Chest tightness 09/23/2018   Bradycardia 09/23/2018   Labile blood pressure 03/08/2018   Chronic low back pain 09/03/2017   History of total hip replacement, left 01/26/2016   EDEMA 04/28/2008   LEG PAIN, BILATERAL 12/16/2007   CERVICAL CANCER 07/02/2007   MORBID OBESITY 07/02/2007   DEPRESSION 07/02/2007   COMMON MIGRAINE 07/02/2007   Other allergic rhinitis  07/02/2007   ASTHMA 07/02/2007   GERD 07/02/2007   ELEVATED BLOOD PRESSURE WITHOUT DIAGNOSIS OF HYPERTENSION 07/02/2007    Zollie Pee,  PT 06/09/2021, 8:36 AM  [place] MAIN [facility] SERVICES [address] [place], [place], [address] Phone: [phone]   Fax:  [phone]  Name: Vanessa Medina MRN: [id number] Date of Birth: 03/21/1971

## 2021-06-10 NOTE — Progress Notes (Signed)
Office Visit Note   Patient: Vanessa Medina           Date of Birth: 09/21/1970           MRN: [id number] Visit Date: 05/05/2021              Requested by: Maryland Pink, MD [address] [facility] [place],  [place] [address] PCP: Maryland Pink, MD   Assessment & Plan: Visit Diagnoses:  1. Lumbar degenerative disc disease     Plan: Advised patient that with her syncopal episode that she should follow-up with PCP and cardiology for evaluation.  She voices understanding of the importance of this.  We will have her follow-up with Dr. Louanne Skye in 3 weeks for recheck of her back pain.  All questions answered.  Follow-Up Instructions: Return in about 3 weeks (around 05/26/2021) for WITH DR Morrison .   Orders:  Orders Placed This Encounter  Procedures   XR Lumbar Spine 2-3 Views   No orders of the defined types were placed in this encounter.     Procedures: No procedures performed   Clinical Data: No additional findings.   Subjective: Chief Complaint  Patient presents with   Lower Back - Follow-up    HPI -year-old white female comes in today with complaints of back pain.  States that pain is a 9 out of 10 and described as sharp and aching.  States that pain was worse after she "blacked out" and fell.  States that she is not sure of how she fell.  Patient does have a cardiologist Dr. Einar Gip and did not contact PCP or cardiologist or go to the emergency department for evaluation.  She does use ice, Tylenol, baclofen and Cymbalta.    Objective: Vital Signs: BP 127/80 (BP Location: Left Arm, Patient Position: Sitting, Cuff Size: Normal)   Pulse 88   SpO2 98%   Physical Exam Patient is ambulating without difficulty.  She does have some thoracolumbar paraspinal tenderness.  Neurologically intact. Ortho Exam  Specialty Comments:  No specialty comments available.  Imaging: No results found.   PMFS History: Patient Active Problem List    Diagnosis Date Noted   Abnormality of gait 04/29/2021   Abnormal sensation in both ears 04/06/2021   Other adverse food reactions, not elsewhere classified, subsequent encounter 04/06/2021   Multiple drug allergies 04/06/2021   Insomnia due to medical condition 02/25/2021   Myofascial muscle pain 02/25/2021   Nerve pain 10/18/2020   Incomplete paraplegia ([place]) 10/18/2020   Orthostatic hypotension 10/18/2020   Chronic migraine without aura without status migrainosus, not intractable    Hypokalemia    Adjustment reaction with anxiety and depression    Spinal cord injury, lumbar, without spinal bone injury, sequela ([place]) 08/18/2020   Paraparesis ([place])    Post-operative pain    Hypotension    Slow transit constipation    AKI (acute kidney injury) ([place])    Right leg weakness 08/11/2020   DDD (degenerative disc disease), lumbar 09/02/2019    Class: Chronic   Degenerative disc disease, lumbar 09/02/2019   Chest tightness 09/23/2018   Bradycardia 09/23/2018   Labile blood pressure 03/08/2018   Chronic low back pain 09/03/2017   History of total hip replacement, left 01/26/2016   EDEMA 04/28/2008   LEG PAIN, BILATERAL 12/16/2007   CERVICAL CANCER 07/02/2007   MORBID OBESITY 07/02/2007   DEPRESSION 07/02/2007   COMMON MIGRAINE 07/02/2007   Other allergic rhinitis 07/02/2007   ASTHMA 07/02/2007  GERD 07/02/2007   ELEVATED BLOOD PRESSURE WITHOUT DIAGNOSIS OF HYPERTENSION 07/02/2007   Past Medical History:  Diagnosis Date   Anemia    Cervical cancer ([place])    Family history of adverse reaction to anesthesia     " my mother takes a long time time wake up"   GERD (gastroesophageal reflux disease)    Headache    migraine   History of dysplastic nevus 01/18/2018   right shoulder, recurrent dysplastic nevus   History of shingles    Hypertension    Migraine    Multiple allergies    Obesity    Osteoarthritis    left hip   PONV (postoperative nausea and vomiting)    Sleep apnea     does not wear CPAP   Stroke ([place]) 08/11/2020   Wears glasses     Family History  Problem Relation Age of Onset   Renal Disease Mother    Hypertension Mother    Sudden Cardiac Death Mother    Heart failure Mother    Valvular heart disease Mother    Heart disease Mother    Stroke Brother    Heart attack Brother 33   Diabetes Other    Breast cancer Cousin        paternal side    Past Surgical History:  Procedure Laterality Date   ABDOMINAL HYSTERECTOMY     APPENDECTOMY     BILIOPANCREATIC DIVERSION     with duodenal switch laparoscopic    CHOLECYSTECTOMY     DIAGNOSTIC LAPAROSCOPY     LOA   ESOPHAGOGASTRODUODENOSCOPY (EGD) WITH PROPOFOL N/A 07/25/2017   Procedure: ESOPHAGOGASTRODUODENOSCOPY (EGD) WITH PROPOFOL;  Surgeon: Manya Silvas, MD;  Location: [facility] ENDOSCOPY;  Service: Endoscopy;  Laterality: N/A;   KNEE ARTHROSCOPY     LAPAROSCOPIC GASTRIC SLEEVE RESECTION WITH HIATAL HERNIA REPAIR     TONSILLECTOMY     TOTAL HIP ARTHROPLASTY Left 01/26/2016   Procedure: LEFT TOTAL HIP ARTHROPLASTY ANTERIOR APPROACH;  Surgeon: Leandrew Koyanagi, MD;  Location: [facility];  Service: Orthopedics;  Laterality: Left;   TRANSFORAMINAL LUMBAR INTERBODY FUSION (TLIF) WITH PEDICLE SCREW FIXATION 3 LEVEL  08/2019   Basil Dess, MD L2-L5   Social History   Occupational History   Not on file  Tobacco Use   Smoking status: Never   Smokeless tobacco: Never  Vaping Use   Vaping Use: Never used  Substance and Sexual Activity   Alcohol use: Yes    Comment: occasional wine   Drug use: No   Sexual activity: Not Currently

## 2021-06-13 ENCOUNTER — Ambulatory Visit: Payer: 59

## 2021-06-15 ENCOUNTER — Ambulatory Visit: Payer: 59

## 2021-06-20 ENCOUNTER — Ambulatory Visit: Payer: 59

## 2021-06-22 ENCOUNTER — Ambulatory Visit
Admission: RE | Admit: 2021-06-22 | Discharge: 2021-06-22 | Disposition: A | Discharge status: Home / Self Care | Payer: 59 | Source: Ambulatory Visit | Attending: Obstetrics and Gynecology | Admitting: Obstetrics and Gynecology

## 2021-06-22 ENCOUNTER — Other Ambulatory Visit: Payer: Self-pay

## 2021-06-22 ENCOUNTER — Ambulatory Visit: Payer: 59

## 2021-06-22 ENCOUNTER — Ambulatory Visit: Payer: 59 | Admitting: Physical Therapy

## 2021-06-22 ENCOUNTER — Encounter: Payer: Self-pay | Admitting: Physical Therapy

## 2021-06-22 DIAGNOSIS — M545 Low back pain, unspecified: Secondary | ICD-10-CM | POA: Diagnosis not present

## 2021-06-22 DIAGNOSIS — R2689 Other abnormalities of gait and mobility: Secondary | ICD-10-CM

## 2021-06-22 DIAGNOSIS — N63 Unspecified lump in unspecified breast: Secondary | ICD-10-CM

## 2021-06-22 DIAGNOSIS — R262 Difficulty in walking, not elsewhere classified: Secondary | ICD-10-CM

## 2021-06-22 DIAGNOSIS — R2681 Unsteadiness on feet: Secondary | ICD-10-CM

## 2021-06-22 DIAGNOSIS — R269 Unspecified abnormalities of gait and mobility: Secondary | ICD-10-CM

## 2021-06-22 DIAGNOSIS — G8929 Other chronic pain: Secondary | ICD-10-CM

## 2021-06-22 DIAGNOSIS — M6281 Muscle weakness (generalized): Secondary | ICD-10-CM

## 2021-06-22 DIAGNOSIS — R278 Other lack of coordination: Secondary | ICD-10-CM

## 2021-06-22 NOTE — Therapy (Signed)
[place] MAIN [facility] SERVICES [place], [place], [address] Phone: [phone]   Fax:  [phone]  Physical Therapy Treatment  Patient Details  Name: Vanessa Medina MRN: [id number] Date of Birth: 03-12-1971 Referring Provider (PT): Dr. Erling Cruz   Encounter Date: 06/22/2021   PT End of Session - 06/22/21 1211     Visit Number 5    Number of Visits 79    Date for PT Re-Evaluation 07/22/21    Authorization Type U[employer] Other    Authorization Time Period 01/24/21-04/18/2021    PT Start Time 1105    PT Stop Time 1150    PT Time Calculation (min) 45 min    Equipment Utilized During Treatment Gait belt    Activity Tolerance Patient tolerated treatment well    Behavior During Therapy [facility] for tasks assessed/performed             Past Medical History:  Diagnosis Date   Anemia    Cervical cancer ([place])    Family history of adverse reaction to anesthesia     " my mother takes a long time time wake up"   GERD (gastroesophageal reflux disease)    Headache    migraine   History of dysplastic nevus 01/18/2018   right shoulder, recurrent dysplastic nevus   History of shingles    Hypertension    Migraine    Multiple allergies    Obesity    Osteoarthritis    left hip   PONV (postoperative nausea and vomiting)    Sleep apnea    does not wear CPAP   Stroke ([place]) 08/11/2020   Wears glasses     Past Surgical History:  Procedure Laterality Date   ABDOMINAL HYSTERECTOMY     APPENDECTOMY     BILIOPANCREATIC DIVERSION     with duodenal switch laparoscopic    CHOLECYSTECTOMY     DIAGNOSTIC LAPAROSCOPY     LOA   ESOPHAGOGASTRODUODENOSCOPY (EGD) WITH PROPOFOL N/A 07/25/2017   Procedure: ESOPHAGOGASTRODUODENOSCOPY (EGD) WITH PROPOFOL;  Surgeon: Manya Silvas, MD;  Location: [facility] ENDOSCOPY;  Service: Endoscopy;  Laterality: N/A;   KNEE ARTHROSCOPY     LAPAROSCOPIC GASTRIC SLEEVE RESECTION WITH HIATAL HERNIA REPAIR      TONSILLECTOMY     TOTAL HIP ARTHROPLASTY Left 01/26/2016   Procedure: LEFT TOTAL HIP ARTHROPLASTY ANTERIOR APPROACH;  Surgeon: Leandrew Koyanagi, MD;  Location: [place];  Service: Orthopedics;  Laterality: Left;   TRANSFORAMINAL LUMBAR INTERBODY FUSION (TLIF) WITH PEDICLE SCREW FIXATION 3 LEVEL  08/2019   Basil Dess, MD L2-L5    There were no vitals filed for this visit.   Subjective Assessment - 06/22/21 1113     Subjective Pt reports still having neck and back pain after fall on 04/30/21; She is still scheduled MD on 10/31 for low back pain; She reports a new fall in last few weeks with no major injury; She reports more numbness in RLE especially after fall; She presents to therapy with tripod base cane.    Pertinent History 50 year old female with history of HTN, migraines, morbid obesity--BMI-41, chronic hypotension, multiple back surgeries with chronic pain with LLE lumbar radiculopathy who was scheduled to have spinal cord stimulator placed by Dr. Davy Pique on 08/11/2020 but unable to perform surgery due to scar tissue.  History taken from patient and chart review.  Post op on awakening, she had excruciating pain RLE with plegia and and allodynia. She was admitted for work up by Dr.  Jones from surgical center on 08/11/2020. Thoracic MRI done revealing subtle increased T2/STIR intensity distal cord at T12-L1 level suspicious for edema/acute spinal cord injury, no CSF leak as well as prior PLIF L2-L5 with residual foraminal protrusion L3/5 potentially irritating right L3 nerve. She was started on IV Dilaudid, gabapentin as well as IV Decadron for management of pain, headaches and severe neuropathy.  She continues to have pain in RLE but is having some motor return, has constant headache, decreased hearing in right> left ear, constipation with nausea as well as epigastric pain and weakness. Therapy ongoing and CIR recommended due to functional decline.  Of note, she reports 50 lbs weigh loss in the past 3-4  months--due to issues with N/V/intake. Has had two falls last month---no recall of incident leading to fall question due to syncope. Was in process of work up for renal disease. She has had issue with LLE weakness with pain for the past year and being followed by Dr. Ernestina Patches. Did well with stimulator trial.    Currently in Pain? Yes    Pain Location Back    Pain Orientation Lower    Pain Descriptors / Indicators Aching;Sharp    Pain Type Acute pain    Pain Onset 1 to 4 weeks ago    Pain Frequency Constant    Aggravating Factors  standing >2-3 min, weight bearing, walking/standing;    Pain Relieving Factors rest/heat    Effect of Pain on Daily Activities decreased activity tolerance;    Multiple Pain Sites No              Patient required min A to transition sit to supine on mat table with LE elevated into hooklying position: Moist heat to low back for comfort concurrent with exercise:   Hooklying: Gluteal squeeze 5 sec hold, submax contraction for warm up and pain tolerance x5 reps Alternate SAQ x10 reps each LE with cues to isolate quad contraction and avoid hip activation for better knee stabilization;  Posterior pelvic tilt 5 sec hold x10 reps; Patient instructed in diaphragmatic breathing for better abdominal activation;   PT applied Pball under BLE: -lumbar trunk rotation to left and midline x2 min with therapist assist for proper positioning; -double knee to chest AAROM x10 reps;   All exercises performed within pain-free or pain limited range.  Patient reports improvement in pain at end of session. She required min A to transition supine to sitting with cues for log roll technique;    Pt educated throughout session about proper posture and technique with exercises. Improved exercise technique, movement at target joints, use of target muscles after min to mod verbal, visual, tactile cues.                           PT Education - 06/22/21 1210      Education Details exercise technique, positioning;    Person(s) Educated Patient    Methods Explanation;Verbal cues    Comprehension Verbalized understanding;Returned demonstration;Verbal cues required;Need further instruction              PT Short Term Goals - 03/22/21 1713       PT SHORT TERM GOAL #1   Title Pt will be independent with HEP in order to improve strength and balance in order to decrease fall risk and improve function at home and work.    Baseline 10/14/2020 on-going/deferred; 10/28/2020 ongoing pt has resumed HEP, reports doing what she can/HEP to be  advanced; 11/18/2020  Pt has had trouble doing HEP d/t fatigue.; 5/23: Pt reports independence    Time 6    Period Weeks    Status Achieved    Target Date 01/24/21               PT Long Term Goals - 04/28/21 3953       PT LONG TERM GOAL #1   Title Patient (< 38 years old) will complete five times sit to stand test in < 10 seconds indicating an increased LE strength and improved balance.    Baseline 12/29: 38.45s; 10/14/2020 42.8 seconds; 10/28/2020 36 seconds; 11/18/2020 34.84 seconds; 12/30/20 41.55sec; 5/23: 27.2 sec 7/19: 23 seconds; 04/28/2021= 16.70 sec with min UE support    Time 12    Period Weeks    Status Partially Met    Target Date 07/21/21      PT LONG TERM GOAL #2   Title Pt will decrease TUG to below 14 seconds/decrease in order to demonstrate decreased fall risk.    Baseline 12/29: 32.94s; 10/14/2020 43.12 sec; 10/28/2020 34.12 seconds; 11/18/2020 26.5 sec; 12/30/20: 31sec c SPC; 5/23: 24.6 with SPC 7/19: 20.4 seconds. 04/28/2021= 18.83 sec using SPC    Time 12    Period Weeks    Status Partially Met    Target Date 07/21/21      PT LONG TERM GOAL #3   Title Pt will decrease DHI score by at least 18 points in order to demonstrate clinically significant reduction in disability    Baseline 12/29: 30; 10/14/2020 62, indicating severe handicap; 10/28/2020 74' 5/23: 72 indicating severe handicap 7/20: 62;  04/28/2021= 54 indicating improvement from severe perception to moderate perception of handicap.   Not reassessed, but reports no significant change/improvement with dizziness   Time 12    Period Weeks    Status Partially Met    Target Date 07/21/21      PT LONG TERM GOAL #4   Title Patient will increase 10 meter walk test to >1.54ms as to improve gait speed for better community ambulation and to reduce fall risk.    Baseline 12/29: 0.37 m/s; 0.38 m/s; 2/10/08/2020 0.31 m/s 11/18/2020 0.34 m/s with SPC; 0.274m 12/30/20; 5/23: [facility]: 0.59 m/s with cane; 04/28/2021= 0.75 m/s using SPC    Time 12    Period Weeks    Status Partially Met      PT LONG TERM GOAL #5   Title Patient will increase FOTO score to equal to or greater than 64 to demonstrate statistically significant improvement in mobility and quality of life.    Baseline 12/29: 52; 10/14/2020 45; 10/28/2020 45; 11/18/2020 45; 12/30/20 44; 5/23: 51 7/20: 39%; 04/28/2021= 51%    Time 12    Period Weeks    Status On-going    Target Date 07/21/21      Additional Long Term Goals   Additional Long Term Goals Yes      PT LONG TERM GOAL #6   Title Patient will increase six minute walk test distance to >1000 for progression to community ambulator and improve gait ability    Baseline 6/6[facility]: 620 ft limited by pain; 04/28/2021= 760 feet with SPC    Time 12    Period Weeks    Status On-going    Target Date 07/21/21      PT LONG TERM GOAL #7   Title Patient will ascend/descend 2- 3 steps with 1 rail  and use of SPC with modified [place] in front of house to safely enter/exit home.    Baseline 04/28/2021 - Patient currently utilizing back steps (2) secondary to fear of falling using deeper steps in front of house.                   Plan - 06/22/21 1211     Clinical Impression Statement Patient presents to therapy with increased low back pain. She reports continued back pain after recent fall a few  weeks ago. patient is still waiting to see MD regarding new onset back pain. She reports her RLE is still numb. Patient instructed in core strengthening/lumbar ROM exercise in hooklying on mat table for comfort. PT applied moist heat concurrent with exercise to help alleviate symptoms. Patient tolerated session fair reporting less discomfort at end of session. She continues to have discomfort with bed mobility and transitioning supine to sit. She would benefit from additional skilled PT Intervention to improve strength and mobility while alleviating pain;    Personal Factors and Comorbidities Comorbidity 3+;Time since onset of injury/illness/exacerbation    Comorbidities HYPOtension, paraparesis, SCI without spinal bone injury, DDD, cervical cancer, acute kidney injury, GERD, asthma, bradycardia, L total hip replacement, hypokalemia    Examination-Activity Limitations Squat;Lift;Stairs;Bend;Stand;Reach Overhead;Carry;Transfers    Examination-Participation Restrictions Cleaning;Laundry;Meal Prep;Community Activity;Driving;Occupation    Stability/Clinical Decision Making Evolving/Moderate complexity    Rehab Potential Fair    PT Frequency Other (comment)   1x/every other week   PT Duration 12 weeks    PT Treatment/Interventions ADLs/Self Care Home Management;Aquatic Therapy;Gait training;Stair training;Functional mobility training;Therapeutic activities;Therapeutic exercise;Balance training;Moist Heat;Manual techniques;Patient/family education;Neuromuscular re-education;Passive range of motion;Energy conservation;Vestibular;Joint Manipulations;Spinal Manipulations    PT Next Visit Plan endurance, dynamic and static balance,endurance, treadmill, leg press.Patient has 7 visits left per ins and will plan for 1x/week every other week through end of cert as needed to achieve goals.    PT Home Exercise Plan Instructed in gentle cervical ROM activities, progress strengthening exercises as patient is able     Consulted and Agree with Plan of Care Patient             Patient will benefit from skilled therapeutic intervention in order to improve the following deficits and impairments:  Abnormal gait, Dizziness, Improper body mechanics, Decreased mobility, Cardiopulmonary status limiting activity, Decreased activity tolerance, Decreased endurance, Decreased range of motion, Decreased strength, Decreased balance, Decreased safety awareness, Difficulty walking, Impaired flexibility, Obesity  Visit Diagnosis: Chronic midline low back pain, unspecified whether sciatica present  Muscle weakness (generalized)  Other abnormalities of gait and mobility  Unsteadiness on feet  Abnormality of gait and mobility  Difficulty in walking, not elsewhere classified  Other lack of coordination     Problem List Patient Active Problem List   Diagnosis Date Noted   Abnormality of gait 04/29/2021   Abnormal sensation in both ears 04/06/2021   Other adverse food reactions, not elsewhere classified, subsequent encounter 04/06/2021   Multiple drug allergies 04/06/2021   Insomnia due to medical condition 02/25/2021   Myofascial muscle pain 02/25/2021   Nerve pain 10/18/2020   Incomplete paraplegia (HCC) 10/18/2020   Orthostatic hypotension 10/18/2020   Chronic migraine without aura without status migrainosus, not intractable    Hypokalemia    Adjustment reaction with anxiety and depression    Spinal cord injury, lumbar, without spinal bone injury, sequela ([place]) 08/18/2020   Paraparesis (HCC)    Post-operative pain    Hypotension    Slow transit constipation  AKI (acute kidney injury) ([place])    Right leg weakness 08/11/2020   DDD (degenerative disc disease), lumbar 09/02/2019    Class: Chronic   Degenerative disc disease, lumbar 09/02/2019   Chest tightness 09/23/2018   Bradycardia 09/23/2018   Labile blood pressure 03/08/2018   Chronic low back pain 09/03/2017   History of total hip  replacement, left 01/26/2016   EDEMA 04/28/2008   LEG PAIN, BILATERAL 12/16/2007   CERVICAL CANCER 07/02/2007   MORBID OBESITY 07/02/2007   DEPRESSION 07/02/2007   COMMON MIGRAINE 07/02/2007   Other allergic rhinitis 07/02/2007   ASTHMA 07/02/2007   GERD 07/02/2007   ELEVATED BLOOD PRESSURE WITHOUT DIAGNOSIS OF HYPERTENSION 07/02/2007    Ajna Moors, PT, DPT 06/22/2021, 12:46 PM  [place] MAIN [facility] SERVICES [address] [place], [place], [address] Phone: [phone]   Fax:  [phone]  Name: Vanessa Medina MRN: [id number] Date of Birth: 09/01/1971

## 2021-06-23 ENCOUNTER — Other Ambulatory Visit: Payer: Self-pay | Admitting: *Deleted

## 2021-06-23 MED ORDER — BACLOFEN 10 MG PO TABS: 10.0000 mg | ORAL_TABLET | Freq: Three times a day (TID) | ORAL | 3 refills | Status: DC

## 2021-06-24 ENCOUNTER — Encounter: Payer: Self-pay | Admitting: *Deleted

## 2021-06-27 ENCOUNTER — Encounter: Payer: Self-pay | Admitting: *Deleted

## 2021-06-27 ENCOUNTER — Ambulatory Visit
Admission: RE | Admit: 2021-06-27 | Discharge: 2021-06-27 | Disposition: A | Discharge status: Home / Self Care | Payer: 59 | Attending: Gastroenterology | Admitting: Gastroenterology

## 2021-06-27 ENCOUNTER — Ambulatory Visit: Payer: 59 | Admitting: Certified Registered Nurse Anesthetist

## 2021-06-27 ENCOUNTER — Ambulatory Visit: Payer: 59

## 2021-06-27 ENCOUNTER — Encounter
Admission: RE | Disposition: A | Discharge status: Home / Self Care | Payer: Self-pay | Source: Home / Self Care | Attending: Gastroenterology

## 2021-06-27 DIAGNOSIS — Z79899 Other long term (current) drug therapy: Secondary | ICD-10-CM | POA: Diagnosis not present

## 2021-06-27 DIAGNOSIS — Z886 Allergy status to analgesic agent status: Secondary | ICD-10-CM | POA: Diagnosis not present

## 2021-06-27 DIAGNOSIS — R112 Nausea with vomiting, unspecified: Secondary | ICD-10-CM | POA: Diagnosis not present

## 2021-06-27 DIAGNOSIS — K2950 Unspecified chronic gastritis without bleeding: Secondary | ICD-10-CM | POA: Diagnosis not present

## 2021-06-27 DIAGNOSIS — R1013 Epigastric pain: Secondary | ICD-10-CM | POA: Insufficient documentation

## 2021-06-27 DIAGNOSIS — Z91041 Radiographic dye allergy status: Secondary | ICD-10-CM | POA: Diagnosis not present

## 2021-06-27 DIAGNOSIS — Z9049 Acquired absence of other specified parts of digestive tract: Secondary | ICD-10-CM | POA: Diagnosis not present

## 2021-06-27 DIAGNOSIS — G8929 Other chronic pain: Secondary | ICD-10-CM | POA: Diagnosis not present

## 2021-06-27 DIAGNOSIS — Z1211 Encounter for screening for malignant neoplasm of colon: Secondary | ICD-10-CM | POA: Diagnosis not present

## 2021-06-27 DIAGNOSIS — Z8541 Personal history of malignant neoplasm of cervix uteri: Secondary | ICD-10-CM | POA: Insufficient documentation

## 2021-06-27 DIAGNOSIS — Z9884 Bariatric surgery status: Secondary | ICD-10-CM | POA: Diagnosis not present

## 2021-06-27 DIAGNOSIS — Z96642 Presence of left artificial hip joint: Secondary | ICD-10-CM | POA: Diagnosis not present

## 2021-06-27 DIAGNOSIS — K5730 Diverticulosis of large intestine without perforation or abscess without bleeding: Secondary | ICD-10-CM | POA: Insufficient documentation

## 2021-06-27 DIAGNOSIS — Z885 Allergy status to narcotic agent status: Secondary | ICD-10-CM | POA: Diagnosis not present

## 2021-06-27 DIAGNOSIS — Z7982 Long term (current) use of aspirin: Secondary | ICD-10-CM | POA: Diagnosis not present

## 2021-06-27 DIAGNOSIS — Z8673 Personal history of transient ischemic attack (TIA), and cerebral infarction without residual deficits: Secondary | ICD-10-CM | POA: Insufficient documentation

## 2021-06-27 HISTORY — PX: COLONOSCOPY: SHX5424

## 2021-06-27 HISTORY — PX: ESOPHAGOGASTRODUODENOSCOPY: SHX5428

## 2021-06-27 SURGERY — COLONOSCOPY
Anesthesia: General

## 2021-06-27 MED ORDER — PROPOFOL 10 MG/ML IV BOLUS
INTRAVENOUS | Status: DC | PRN
  Administered 2021-06-27: 20 mg via INTRAVENOUS
  Administered 2021-06-27: 50 mg via INTRAVENOUS
  Administered 2021-06-27: 10 mg via INTRAVENOUS
  Administered 2021-06-27: 20 mg via INTRAVENOUS
  Administered 2021-06-27: 30 mg via INTRAVENOUS

## 2021-06-27 MED ORDER — LIDOCAINE HCL (CARDIAC) PF 100 MG/5ML IV SOSY
PREFILLED_SYRINGE | INTRAVENOUS | Status: DC | PRN
  Administered 2021-06-27: 50 mg via INTRAVENOUS

## 2021-06-27 MED ORDER — PROPOFOL 500 MG/50ML IV EMUL
INTRAVENOUS | Status: AC
  Filled 2021-06-27: qty 50

## 2021-06-27 MED ORDER — DEXMEDETOMIDINE (PRECEDEX) IN NS 20 MCG/5ML (4 MCG/ML) IV SYRINGE
PREFILLED_SYRINGE | INTRAVENOUS | Status: DC | PRN
  Administered 2021-06-27 (×2): 4 ug via INTRAVENOUS

## 2021-06-27 MED ORDER — PHENYLEPHRINE HCL (PRESSORS) 10 MG/ML IV SOLN
INTRAVENOUS | Status: DC | PRN
  Administered 2021-06-27 (×3): 80 ug via INTRAVENOUS

## 2021-06-27 MED ORDER — GLYCOPYRROLATE 0.2 MG/ML IJ SOLN
INTRAMUSCULAR | Status: DC | PRN
  Administered 2021-06-27: .2 mg via INTRAVENOUS

## 2021-06-27 MED ORDER — PROPOFOL 500 MG/50ML IV EMUL
INTRAVENOUS | Status: DC | PRN
  Administered 2021-06-27: 150 ug/kg/min via INTRAVENOUS

## 2021-06-27 MED ORDER — SODIUM CHLORIDE 0.9 % IV SOLN
INTRAVENOUS | Status: DC
  Administered 2021-06-27: 20 mL/h via INTRAVENOUS

## 2021-06-27 NOTE — Op Note (Signed)
[facility] Gastroenterology Patient Name: Vanessa Medina Procedure Date: 06/27/2021 11:06 AM MRN: [id number] Account #: [id number] Date of Birth: [birth year] Admit Type: Outpatient Age: 50 Room: [facility] ENDO ROOM 2 Gender: Female Note Status: Finalized Instrument Name: Colonoscope [id number] Procedure:             Colonoscopy Indications:           Screening for colorectal malignant neoplasm Providers:             Annamaria Helling DO, DO Medicines:             Monitored Anesthesia Care Complications:         No immediate complications. Estimated blood loss: None. Procedure:             Pre-Anesthesia Assessment:                        - Prior to the procedure, a History and Physical was                         performed, and patient medications and allergies were                         reviewed. The patient is competent. The risks and                         benefits of the procedure and the sedation options and                         risks were discussed with the patient. All questions                         were answered and informed consent was obtained.                         Patient identification and proposed procedure were                         verified by the physician, the nurse, the anesthetist                         and the technician in the endoscopy suite. Mental                         Status Examination: alert and oriented. Airway                         Examination: normal oropharyngeal airway and neck                         mobility. Respiratory Examination: clear to                         auscultation. CV Examination: normal. Prophylactic                         Antibiotics: The patient does not require prophylactic                         antibiotics. Prior Anticoagulants: The  patient has                         taken no previous anticoagulant or antiplatelet                         agents. ASA Grade Assessment: III - A patient with                          severe systemic disease. After reviewing the risks and                         benefits, the patient was deemed in satisfactory                         condition to undergo the procedure. The anesthesia                         plan was to use monitored anesthesia care (MAC).                         Immediately prior to administration of medications,                         the patient was re-assessed for adequacy to receive                         sedatives. The heart rate, respiratory rate, oxygen                         saturations, blood pressure, adequacy of pulmonary                         ventilation, and response to care were monitored                         throughout the procedure. The physical status of the                         patient was re-assessed after the procedure.                        After obtaining informed consent, the colonoscope was                         passed under direct vision. Throughout the procedure,                         the patient's blood pressure, pulse, and oxygen                         saturations were monitored continuously. The                         Colonoscope was introduced through the anus and                         advanced to the the terminal ileum, with  identification of the appendiceal orifice and IC                         valve. The colonoscopy was performed without                         difficulty. The patient tolerated the procedure well.                         The quality of the bowel preparation was evaluated                         using the BBPS [facility] Bowel Preparation Scale) with                         scores of: Right Colon = 2 (minor amount of residual                         staining, small fragments of stool and/or opaque                         liquid, but mucosa seen well), Transverse Colon = 2                         (minor amount of residual staining, small fragments  of                         stool and/or opaque liquid, but mucosa seen well) and                         Left Colon = 1 (portion of mucosa seen, but other                         areas not well seen due to staining, residual stool                         and/or opaque liquid). The total BBPS score equals 5.                         The quality of the bowel preparation was inadequate.                         The terminal ileum, ileocecal valve, appendiceal                         orifice, and rectum were photographed. Findings:      The perianal and digital rectal examinations were normal. Pertinent       negatives include normal sphincter tone.      The terminal ileum appeared normal.      Multiple small-mouthed diverticula were found in the left colon.       Estimated blood loss: none.      Semi-liquid stool was found in the recto-sigmoid colon and in the       sigmoid colon, interfering with visualization. Lavage of the area was       performed using a large amount of sterile water, resulting in incomplete  clearance with fair visualization. suctioning in attempt to clear area.       still with limited view      Due to fair/inadequate prep- no large lesions were noted, but small to       medium sized lesions could not be ruled out, especially in the left       colon. Otherwise normal appearing colonic mucosa with retroflexion. Impression:            - Preparation of the colon was inadequate.                        - The examined portion of the ileum was normal.                        - Diverticulosis in the left colon.                        - Stool in the recto-sigmoid colon and in the sigmoid                         colon.                        - No specimens collected. Recommendation:        - Discharge patient to home.                        - Resume previous diet.                        - Continue present medications.                        - Repeat colonoscopy in 1 year  because the bowel                         preparation was poor.                        - Return to referring physician as previously                         scheduled. Procedure Code(s):     --- Professional ---                        [phone], Colonoscopy, flexible; diagnostic, including                         collection of specimen(s) by brushing or washing, when                         performed (separate procedure) Diagnosis Code(s):     --- Professional ---                        Z12.11, Encounter for screening for malignant neoplasm                         of colon  K57.30, Diverticulosis of large intestine without                         perforation or abscess without bleeding CPT copyright 2019 American Medical Association. All rights reserved. The codes documented in this report are preliminary and upon coder review may  be revised to meet current compliance requirements. Attending Participation:      I personally performed the entire procedure. Volney American, DO Annamaria Helling DO, DO 06/27/2021 12:17:37 PM This report has been signed electronically. Number of Addenda: 0 Note Initiated On: 06/27/2021 11:06 AM Scope Withdrawal Time: 0 hours 10 minutes 25 seconds  Total Procedure Duration: 0 hours 18 minutes 50 seconds  Estimated Blood Loss:  Estimated blood loss: none.      [facility]

## 2021-06-27 NOTE — Interval H&P Note (Signed)
History and Physical Interval Note: Preprocedure H&P from 06/27/21  was reviewed and there was no interval change after seeing and examining the patient.  Written consent was obtained from the patient after discussion of risks, benefits, and alternatives. Patient has consented to proceed with Esophagogastroduodenoscopy and Colonoscopy with possible intervention   06/27/2021 11:22 AM  [name]  has presented today for surgery, with the diagnosis of Nausea and vomiting, unspecified vomiting type (R11.2) Colon cancer screening (Z12.11).  The various methods of treatment have been discussed with the patient and family. After consideration of risks, benefits and other options for treatment, the patient has consented to  Procedure(s): COLONOSCOPY (N/A) ESOPHAGOGASTRODUODENOSCOPY (EGD) (N/A) as a surgical intervention.  The patient's history has been reviewed, patient examined, no change in status, stable for surgery.  I have reviewed the patient's chart and labs.  Questions were answered to the patient's satisfaction.     Annamaria Helling

## 2021-06-27 NOTE — Anesthesia Preprocedure Evaluation (Signed)
Anesthesia Evaluation  Patient identified by MRN, date of birth, ID band Patient awake    Reviewed: Allergy & Precautions, NPO status , Patient's Chart, lab work & pertinent test results  History of Anesthesia Complications (+) PONV and history of anesthetic complications  Airway Mallampati: II  TM Distance: >3 FB Neck ROM: Full    Dental  (+) Missing   Pulmonary sleep apnea , neg COPD,    breath sounds clear to auscultation- rhonchi (-) wheezing      Cardiovascular hypertension, Pt. on medications (-) CAD, (-) Past MI, (-) Cardiac Stents and (-) CABG  Rhythm:Regular Rate:Normal - Systolic murmurs and - Diastolic murmurs    Neuro/Psych  Headaches, PSYCHIATRIC DISORDERS Depression Spinal cord injury during nerve stimulator surgery     GI/Hepatic Neg liver ROS, GERD  ,  Endo/Other  negative endocrine ROSneg diabetes  Renal/GU Renal InsufficiencyRenal disease     Musculoskeletal  (+) Arthritis ,   Abdominal (+) + obese,   Peds  Hematology  (+) anemia ,   Anesthesia Other Findings Past Medical History: No date: Anemia No date: Cervical cancer (HCC) No date: Family history of adverse reaction to anesthesia     Comment:   " my mother takes a long time time wake up" No date: GERD (gastroesophageal reflux disease) No date: Headache     Comment:  migraine 01/18/2018: History of dysplastic nevus     Comment:  right shoulder, recurrent dysplastic nevus No date: History of shingles No date: Hypertension No date: Migraine No date: Multiple allergies No date: Obesity No date: Osteoarthritis     Comment:  left hip No date: PONV (postoperative nausea and vomiting) No date: Sleep apnea     Comment:  does not wear CPAP 08/11/2020: Stroke ([place]) No date: Wears glasses   Reproductive/Obstetrics                             Anesthesia Physical Anesthesia Plan  ASA: 3  Anesthesia Plan: General    Post-op Pain Management:    Induction: Intravenous  PONV Risk Score and Plan: 3 and Propofol infusion  Airway Management Planned: Natural Airway  Additional Equipment:   Intra-op Plan:   Post-operative Plan:   Informed Consent: I have reviewed the patients History and Physical, chart, labs and discussed the procedure including the risks, benefits and alternatives for the proposed anesthesia with the patient or authorized representative who has indicated his/her understanding and acceptance.     Dental advisory given  Plan Discussed with: CRNA and Anesthesiologist  Anesthesia Plan Comments:         Anesthesia Quick Evaluation

## 2021-06-27 NOTE — Transfer of Care (Signed)
Immediate Anesthesia Transfer of Care Note  Patient: Vanessa Medina  Procedure(s) Performed: COLONOSCOPY ESOPHAGOGASTRODUODENOSCOPY (EGD)  Patient Location: PACU  Anesthesia Type:General  Level of Consciousness: drowsy  Airway & Oxygen Therapy: Patient Spontanous Breathing  Post-op Assessment: Report given to RN and Post -op Vital signs reviewed and stable  Post vital signs: Reviewed and stable  Last Vitals:  Vitals Value Taken Time  BP 102/55 06/27/21 1206  Temp 36.1 C 06/27/21 1206  Pulse 68 06/27/21 1206  Resp 17 06/27/21 1206  SpO2 99 % 06/27/21 1206    Last Pain:  Vitals:   06/27/21 1206  TempSrc: Temporal  PainSc: Asleep         Complications: No notable events documented.

## 2021-06-27 NOTE — Anesthesia Postprocedure Evaluation (Signed)
Anesthesia Post Note  Patient: KRISHA BEEGLE  Procedure(s) Performed: COLONOSCOPY ESOPHAGOGASTRODUODENOSCOPY (EGD)  Patient location during evaluation: Endoscopy Anesthesia Type: General Level of consciousness: awake and alert and oriented Pain management: pain level controlled Vital Signs Assessment: post-procedure vital signs reviewed and stable Respiratory status: spontaneous breathing, nonlabored ventilation and respiratory function stable Cardiovascular status: blood pressure returned to baseline and stable Postop Assessment: no signs of nausea or vomiting Anesthetic complications: no   No notable events documented.   Last Vitals:  Vitals:   06/27/21 1226 06/27/21 1236  BP: (!) 92/42 125/74  Pulse: 75 69  Resp: (!) 22 17  Temp:    SpO2: 100% 99%    Last Pain:  Vitals:   06/27/21 1236  TempSrc:   PainSc: 0-No pain                 Desman Polak

## 2021-06-27 NOTE — Op Note (Signed)
[facility] Gastroenterology Patient Name: Vanessa Medina Procedure Date: 06/27/2021 11:06 AM MRN: [id number] Account #: [id number] Date of Birth: 01/09/1971 Admit Type: Outpatient Age: 50 Room: [facility] ENDO ROOM 2 Gender: Female Note Status: Finalized Instrument Name: Upper Endoscope [id number] Procedure:             Upper GI endoscopy Indications:           Epigastric abdominal pain, Nausea with vomiting Providers:             Annamaria Helling DO, DO Medicines:             Monitored Anesthesia Care Complications:         No immediate complications. Estimated blood loss:                         Minimal. Procedure:             Pre-Anesthesia Assessment:                        - Prior to the procedure, a History and Physical was                         performed, and patient medications and allergies were                         reviewed. The patient is competent. The risks and                         benefits of the procedure and the sedation options and                         risks were discussed with the patient. All questions                         were answered and informed consent was obtained.                         Patient identification and proposed procedure were                         verified by the physician, the nurse, the anesthetist                         and the technician in the endoscopy suite. Mental                         Status Examination: alert and oriented. Airway                         Examination: normal oropharyngeal airway and neck                         mobility. Respiratory Examination: clear to                         auscultation. CV Examination: RRR, no murmurs, no S3                         or S4. Prophylactic Antibiotics:  The patient does not                         require prophylactic antibiotics. Prior                         Anticoagulants: The patient has taken no previous                         anticoagulant or  antiplatelet agents. ASA Grade                         Assessment: III - A patient with severe systemic                         disease. After reviewing the risks and benefits, the                         patient was deemed in satisfactory condition to                         undergo the procedure. The anesthesia plan was to use                         monitored anesthesia care (MAC). Immediately prior to                         administration of medications, the patient was                         re-assessed for adequacy to receive sedatives. The                         heart rate, respiratory rate, oxygen saturations,                         blood pressure, adequacy of pulmonary ventilation, and                         response to care were monitored throughout the                         procedure. The physical status of the patient was                         re-assessed after the procedure.                        After obtaining informed consent, the endoscope was                         passed under direct vision. Throughout the procedure,                         the patient's blood pressure, pulse, and oxygen                         saturations were monitored continuously. The Endoscope  was introduced through the mouth, and advanced to the                         second part of duodenum. The upper GI endoscopy was                         accomplished without difficulty. The patient tolerated                         the procedure well. Findings:      - Duodenum - Small bowel mucosa appeared normal      - Stomach- Gastric sleeve anatomy noted. Gastritis was appreciated. Some       minor erosions noted in the gastric body. Biopsies taken with cold       forceps to rule out h pylori. Pylorus was widely patent with no signs of       ulceration.      - Esophagus- GEJ 35 cm from incisors with ~5-56m tongue of salmon       mucosa- biopsied with cold forceps to  rule out barretts esophagus.       Otherwise normal appearing esophageal mucosa. Biopsies were taken with a       cold forceps for Helicobacter pylori testing. Biopsies were taken with a       cold forceps for histology. Estimated blood loss was minimal. Impression:            - Duodenum - Small bowel mucosa appeared normal                        - Stomach- Gastric sleeve anatomy noted. Gastritis was                         appreciated. Some minor erosions noted in the gastric                         body. Biopsies taken with cold forceps to rule out h                         pylori. Pylorus was widely patent with no signs of                         ulceration.                        - Esophagus- GEJ 35 cm from incisors with ~5-166m                        tongue of salmon mucosa- biopsied with cold forceps to                         rule out barretts esophagus. Otherwise normal                         appearing esophageal mucosa. Recommendation:        - Discharge patient to home (ambulatory).                        - Small frequent meals with low residue indefinitely.                        -  Continue present medications.                        - Await pathology results.                        - Return to referring physician as previously                         scheduled. Procedure Code(s):     --- Professional ---                        [phone], Esophagogastroduodenoscopy, flexible,                         transoral; with biopsy, single or multiple Diagnosis Code(s):     --- Professional ---                        R10.13, Epigastric pain                        R11.2, Nausea with vomiting, unspecified                        K29.70, Gastritis, unspecified, without bleeding CPT copyright 2019 American Medical Association. All rights reserved. The codes documented in this report are preliminary and upon coder review may  be revised to meet current compliance requirements. Attending  Participation:      I personally performed the entire procedure. Volney American, DO Annamaria Helling DO, DO 06/27/2021 12:12:25 PM This report has been signed electronically. Number of Addenda: 0 Note Initiated On: 06/27/2021 11:06 AM Estimated Blood Loss:  Estimated blood loss was minimal.      [facility]

## 2021-06-27 NOTE — H&P (Signed)
[facility] Pre-Procedure H&P   Patient ID: Vanessa Medina is a 50 y.o. female.  Gastroenterology Provider: Annamaria Helling, DO  Referring Provider: Laurine Blazer, PA PCP: Maryland Pink, MD  Date: 06/27/2021  HPI [name] is a 50 y.o. female who presents today for Esophagogastroduodenoscopy and Colonoscopy for chronic abdominal pain, nausea vomiting.   Patient had a gastric sleeve for weight loss in 2015 and has had chronic issues since then. Underwent lysis of adhesions and revision of her sleeve in 08/2018.  Pain is chronic and mostly post prandially. Associated with nausea vomiting. No reflux and not on medication. Denies nsaids.  BM regular w/o constipation, diarrhea, blood, or melena.  No fhx crc or polyps.  Past Medical History:  Diagnosis Date   Anemia    Cervical cancer ([place])    Family history of adverse reaction to anesthesia     " my mother takes a long time time wake up"   GERD (gastroesophageal reflux disease)    Headache    migraine   History of dysplastic nevus 01/18/2018   right shoulder, recurrent dysplastic nevus   History of shingles    Hypertension    Migraine    Multiple allergies    Obesity    Osteoarthritis    left hip   PONV (postoperative nausea and vomiting)    Sleep apnea    does not wear CPAP   Stroke ([place]) 08/11/2020   Wears glasses     Past Surgical History:  Procedure Laterality Date   ABDOMINAL HYSTERECTOMY     APPENDECTOMY     BACK SURGERY     BILIOPANCREATIC DIVERSION     with duodenal switch laparoscopic    CHOLECYSTECTOMY     DIAGNOSTIC LAPAROSCOPY     LOA   ESOPHAGOGASTRODUODENOSCOPY (EGD) WITH PROPOFOL N/A 07/25/2017   Procedure: ESOPHAGOGASTRODUODENOSCOPY (EGD) WITH PROPOFOL;  Surgeon: Manya Silvas, MD;  Location[facility] ENDOSCOPY;  Service: Endoscopy;  Laterality: N/A;   KNEE ARTHROSCOPY     LAPAROSCOPIC GASTRIC SLEEVE RESECTION WITH HIATAL HERNIA REPAIR     TONSILLECTOMY      TOTAL HIP ARTHROPLASTY Left 01/26/2016   Procedure: LEFT TOTAL HIP ARTHROPLASTY ANTERIOR APPROACH;  Surgeon: Leandrew Koyanagi, MD;  Location: [facility];  Service: Orthopedics;  Laterality: Left;   TRANSFORAMINAL LUMBAR INTERBODY FUSION (TLIF) WITH PEDICLE SCREW FIXATION 3 LEVEL  08/2019   Basil Dess, MD L2-L5    Family History No h/o GI disease or malignancy  Review of Systems  Constitutional:  Negative for activity change, appetite change, fatigue, fever and unexpected weight change.  HENT:  Negative for trouble swallowing and voice change.   Respiratory:  Negative for shortness of breath and wheezing.   Cardiovascular:  Negative for chest pain and palpitations.  Gastrointestinal:  Positive for abdominal pain, nausea and vomiting. Negative for abdominal distention, anal bleeding, blood in stool, constipation, diarrhea and rectal pain.  Musculoskeletal:  Negative for arthralgias and myalgias.  Skin:  Negative for color change and pallor.  Neurological:  Negative for dizziness, syncope and weakness.  Psychiatric/Behavioral:  Negative for confusion.   All other systems reviewed and are negative.   Medications No current facility-administered medications on file prior to encounter.   Current Outpatient Medications on File Prior to Encounter  Medication Sig Dispense Refill   aspirin EC 81 MG tablet Take 81 mg by mouth daily. Swallow whole.     azelastine (ASTELIN) 0.1 % nasal spray Place into the nose.  diclofenac Sodium (VOLTAREN) 1 % GEL Apply 4 g topically 4 (four) times daily. 350 g 3   DULoxetine (CYMBALTA) 30 MG capsule Take 1 capsule by mouth at bedtime for 1 week then 2 capsules at bedtime thereafter 180 capsule 0   DULoxetine (CYMBALTA) 60 MG capsule TAKE 1 CAPSULE BY MOUTH  NIGHTLY 90 capsule 3   EPINEPHrine 0.3 mg/0.3 mL IJ SOAJ injection SMARTSIG:0.3 Milliliter(s) IM Once PRN     fludrocortisone (FLORINEF) 0.1 MG tablet Take 0.1 mg by mouth 2 (two) times daily.     fluticasone  (FLONASE) 50 MCG/ACT nasal spray Place 1-2 sprays into both nostrils daily.     gabapentin (NEURONTIN) 300 MG capsule Take 300 mg by mouth daily.     gabapentin (NEURONTIN) 400 MG capsule TAKE 3 CAPSULES(1200 MG) BY MOUTH AT BEDTIME 90 capsule 5   midodrine (PROAMATINE) 10 MG tablet Take 1 tablet (10 mg total) by mouth 3 (three) times daily. While awake. Can take one additional if dizziness 270 tablet 3   montelukast (SINGULAIR) 10 MG tablet Take 1 tablet (10 mg total) by mouth at bedtime. For allergies 90 tablet 3   Multiple Vitamin (MULTIVITAMIN WITH MINERALS) TABS tablet Take 1 tablet by mouth 2 (two) times daily.     pyridOXINE (VITAMIN B-6) 100 MG tablet Take 1 tablet (100 mg total) by mouth daily. 90 tablet 1   Thiamine HCl (VITAMIN B-1) 250 MG tablet Take 1 tablet (250 mg total) by mouth daily. 90 tablet 1   topiramate (TOPAMAX) 100 MG tablet TAKE 1 TABLET(100 MG) BY MOUTH AT BEDTIME 90 tablet 3   vitamin B-12 (CYANOCOBALAMIN) 1000 MCG tablet Take 1 tablet (1,000 mcg total) by mouth daily. 90 tablet 1   methocarbamol (ROBAXIN) 500 MG tablet TAKE 1 TABLET BY MOUTH  EVERY 12 HOURS AS NEEDED  FOR MUSCLE SPASMS 30 tablet 0   pramipexole (MIRAPEX) 0.125 MG tablet Take 0.5 mg by mouth at bedtime.      Pertinent medications related to GI and procedure were reviewed by me with the patient prior to the procedure   Current Facility-Administered Medications:    0.9 %  sodium chloride infusion, , Intravenous, Continuous, Annamaria Helling, DO, Last Rate: 20 mL/hr at 06/27/21 1110, Continued from Pre-op at 06/27/21 1110  sodium chloride 20 mL/hr at 06/27/21 1110       Allergies  Allergen Reactions   Shellfish Allergy Anaphylaxis   Meperidine Hcl Other (See Comments)    BRADYCARDIA   Morphine Other (See Comments)    BRADYCARDIA   Acetaminophen Itching   Bee Pollen Other (See Comments)    Unknown   Ivp Dye [Iodinated Diagnostic Agents] Other (See Comments)    Swelling    Oxycodone  Hives and Itching    Blisters on back   Pentazocine Nausea Only and Nausea And Vomiting   Codeine Nausea And Vomiting   Oxycodone-Acetaminophen Itching   Pentazocine Lactate Nausea And Vomiting    REACTION: vomiting with [place] NX   Propoxyphene Nausea Only and Nausea And Vomiting   Propoxyphene N-Acetaminophen Nausea And Vomiting   Allergies were reviewed by me prior to the procedure  Objective    Vitals:   06/27/21 0958  BP: 122/72  Pulse: 69  Resp: 20  Temp: (!) 96.8 F (36 C)  TempSrc: Temporal  SpO2: 100%  Weight: 116.1 kg  Height: 5\' 4"  (1.626 m)     Physical Exam Vitals reviewed.  Constitutional:      General: She is  not in acute distress.    Appearance: Normal appearance. She is obese. She is not ill-appearing, toxic-appearing or diaphoretic.  HENT:     Head: Normocephalic and atraumatic.     Nose: Nose normal.     Mouth/Throat:     Mouth: Mucous membranes are moist.     Pharynx: Oropharynx is clear.  Eyes:     General: No scleral icterus.    Extraocular Movements: Extraocular movements intact.  Cardiovascular:     Rate and Rhythm: Normal rate and regular rhythm.     Heart sounds: Normal heart sounds. No murmur heard.   No friction rub. No gallop.  Pulmonary:     Effort: Pulmonary effort is normal. No respiratory distress.     Breath sounds: Normal breath sounds. No wheezing, rhonchi or rales.  Abdominal:     General: Bowel sounds are normal. There is no distension.     Palpations: Abdomen is soft.     Tenderness: There is no abdominal tenderness. There is no guarding or rebound.  Musculoskeletal:     Cervical back: Neck supple.     Right lower leg: No edema.     Left lower leg: No edema.  Skin:    General: Skin is warm and dry.     Coloration: Skin is not jaundiced or pale.  Neurological:     General: No focal deficit present.     Mental Status: She is alert and oriented to person, place, and time. Mental status is at baseline.  Psychiatric:         Mood and Affect: Mood normal.        Behavior: Behavior normal.        Thought Content: Thought content normal.        Judgment: Judgment normal.     Assessment:  Ms. TYKIRA WACHS is a 50 y.o. female  who presents today for Esophagogastroduodenoscopy and Colonoscopy for chronic abdominal pain, nausea vomiting.  Plan:  Esophagogastroduodenoscopy and Colonoscopy with possible intervention today  Esophagogastroduodenoscopy and Colonoscopy with possible biopsy, control of bleeding, polypectomy, and interventions as necessary has been discussed with the patient/patient representative. Informed consent was obtained from the patient/patient representative after explaining the indication, nature, and risks of the procedure including but not limited to death, bleeding, perforation, missed neoplasm/lesions, cardiorespiratory compromise, and reaction to medications. Opportunity for questions was given and appropriate answers were provided. Patient/patient representative has verbalized understanding is amenable to undergoing the procedure.   Annamaria Helling, DO  [facility]astroenterology  Portions of the record may have been created with voice recognition software. Occasional wrong-word or 'sound-a-like' substitutions may have occurred due to the inherent limitations of voice recognition software.  Read the chart carefully and recognize, using context, where substitutions may have occurred.

## 2021-06-28 ENCOUNTER — Encounter: Payer: Self-pay | Admitting: Gastroenterology

## 2021-06-29 ENCOUNTER — Ambulatory Visit: Payer: 59

## 2021-06-30 LAB — SURGICAL PATHOLOGY

## 2021-07-04 ENCOUNTER — Ambulatory Visit: Payer: 59

## 2021-07-04 ENCOUNTER — Other Ambulatory Visit: Payer: Self-pay

## 2021-07-04 ENCOUNTER — Ambulatory Visit (INDEPENDENT_AMBULATORY_CARE_PROVIDER_SITE_OTHER): Payer: 59 | Admitting: Specialist

## 2021-07-04 ENCOUNTER — Encounter: Payer: Self-pay | Admitting: Specialist

## 2021-07-04 VITALS — BP 104/70 | HR 72 | Ht 64.0 in | Wt 256.0 lb

## 2021-07-04 DIAGNOSIS — M4807 Spinal stenosis, lumbosacral region: Secondary | ICD-10-CM

## 2021-07-04 DIAGNOSIS — Z981 Arthrodesis status: Secondary | ICD-10-CM | POA: Diagnosis not present

## 2021-07-04 DIAGNOSIS — M5136 Other intervertebral disc degeneration, lumbar region: Secondary | ICD-10-CM

## 2021-07-04 DIAGNOSIS — Z4889 Encounter for other specified surgical aftercare: Secondary | ICD-10-CM | POA: Diagnosis not present

## 2021-07-04 DIAGNOSIS — G8220 Paraplegia, unspecified: Secondary | ICD-10-CM | POA: Diagnosis not present
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
## 2021-07-04 NOTE — Patient Instructions (Signed)
Avoid bending, stooping and avoid lifting weights greater than 10 lbs. Avoid prolong standing and walking. Avoid frequent bending and stooping  No lifting greater than 10 lbs. May use ice or moist heat for pain. Weight loss is of benefit. Handicap license is approved. MRI of the lumbar spine due to persistence of severe low back pain since recent fall in September, 2 months ago.

## 2021-07-06 ENCOUNTER — Ambulatory Visit: Payer: 59 | Attending: Physical Medicine and Rehabilitation | Admitting: Physical Therapy

## 2021-07-06 ENCOUNTER — Other Ambulatory Visit: Payer: Self-pay

## 2021-07-06 ENCOUNTER — Ambulatory Visit: Payer: 59

## 2021-07-06 ENCOUNTER — Encounter: Payer: Self-pay | Admitting: Physical Therapy

## 2021-07-06 DIAGNOSIS — R278 Other lack of coordination: Secondary | ICD-10-CM | POA: Diagnosis present

## 2021-07-06 DIAGNOSIS — R2689 Other abnormalities of gait and mobility: Secondary | ICD-10-CM | POA: Diagnosis present

## 2021-07-06 DIAGNOSIS — M6281 Muscle weakness (generalized): Secondary | ICD-10-CM | POA: Diagnosis not present

## 2021-07-06 DIAGNOSIS — M545 Low back pain, unspecified: Secondary | ICD-10-CM | POA: Insufficient documentation

## 2021-07-06 DIAGNOSIS — R2681 Unsteadiness on feet: Secondary | ICD-10-CM | POA: Diagnosis present

## 2021-07-06 DIAGNOSIS — G8929 Other chronic pain: Secondary | ICD-10-CM | POA: Diagnosis present

## 2021-07-06 DIAGNOSIS — M25561 Pain in right knee: Secondary | ICD-10-CM | POA: Insufficient documentation

## 2021-07-06 DIAGNOSIS — R262 Difficulty in walking, not elsewhere classified: Secondary | ICD-10-CM | POA: Insufficient documentation

## 2021-07-06 NOTE — Therapy (Signed)
[place] MAIN [facility] SERVICES [address] [place], [place], [address] Phone: [phone]   Fax:  [phone]  Physical Therapy Treatment  Patient Details  Name: Vanessa Medina MRN: [id number] Date of Birth: 08-25-1971 Referring Provider (PT): Dr. Erling Cruz   Encounter Date: 07/06/2021   PT End of Session - 07/06/21 1119     Visit Number 54    Number of Visits 51    Date for PT Re-Evaluation 07/22/21    Authorization Type UHC Other    Authorization Time Period 01/24/21-04/18/2021    PT Start Time 1104    PT Stop Time 1145    PT Time Calculation (min) 41 min    Equipment Utilized During Treatment Gait belt    Activity Tolerance Patient tolerated treatment well    Behavior During Therapy [facility] for tasks assessed/performed             Past Medical History:  Diagnosis Date   Anemia    Cervical cancer ([place])    Family history of adverse reaction to anesthesia     " my mother takes a long time time wake up"   GERD (gastroesophageal reflux disease)    Headache    migraine   History of dysplastic nevus 01/18/2018   right shoulder, recurrent dysplastic nevus   History of shingles    Hypertension    Migraine    Multiple allergies    Obesity    Osteoarthritis    left hip   PONV (postoperative nausea and vomiting)    Sleep apnea    does not wear CPAP   Stroke ([place]) 08/11/2020   Wears glasses     Past Surgical History:  Procedure Laterality Date   ABDOMINAL HYSTERECTOMY     APPENDECTOMY     BACK SURGERY     BILIOPANCREATIC DIVERSION     with duodenal switch laparoscopic    CHOLECYSTECTOMY     COLONOSCOPY N/A 06/27/2021   Procedure: COLONOSCOPY;  Surgeon: Annamaria Helling, DO;  Location: [facility] ENDOSCOPY;  Service: Gastroenterology;  Laterality: N/A;   DIAGNOSTIC LAPAROSCOPY     LOA   ESOPHAGOGASTRODUODENOSCOPY N/A 06/27/2021   Procedure: ESOPHAGOGASTRODUODENOSCOPY (EGD);  Surgeon: Annamaria Helling, DO;  Location: [facility]  ENDOSCOPY;  Service: Gastroenterology;  Laterality: N/A;   ESOPHAGOGASTRODUODENOSCOPY (EGD) WITH PROPOFOL N/A 07/25/2017   Procedure: ESOPHAGOGASTRODUODENOSCOPY (EGD) WITH PROPOFOL;  Surgeon: Manya Silvas, MD;  Location: [facility] ENDOSCOPY;  Service: Endoscopy;  Laterality: N/A;   KNEE ARTHROSCOPY     LAPAROSCOPIC GASTRIC SLEEVE RESECTION WITH HIATAL HERNIA REPAIR     TONSILLECTOMY     TOTAL HIP ARTHROPLASTY Left 01/26/2016   Procedure: LEFT TOTAL HIP ARTHROPLASTY ANTERIOR APPROACH;  Surgeon: Leandrew Koyanagi, MD;  Location: [place];  Service: Orthopedics;  Laterality: Left;   TRANSFORAMINAL LUMBAR INTERBODY FUSION (TLIF) WITH PEDICLE SCREW FIXATION 3 LEVEL  08/2019   Basil Dess, MD L2-L5    There were no vitals filed for this visit.   Subjective Assessment - 07/06/21 1114     Subjective Patient reports increased low back pain that has persisted. She did see orthoped MD this week who is going to order a new MRI. She is also planning to get a 2nd opinion due to progressive back pain; She reports only feeling relief with flexed position;    Pertinent History 50 year old female with history of HTN, migraines, morbid obesity--BMI-41, chronic hypotension, multiple back surgeries with chronic pain with LLE lumbar radiculopathy who was scheduled to  have spinal cord stimulator placed by Dr. Davy Pique on 08/11/2020 but unable to perform surgery due to scar tissue.  History taken from patient and chart review.  Post op on awakening, she had excruciating pain RLE with plegia and and allodynia. She was admitted for work up by Dr. Ronnald Ramp from surgical center on 08/11/2020. Thoracic MRI done revealing subtle increased T2/STIR intensity distal cord at T12-L1 level suspicious for edema/acute spinal cord injury, no CSF leak as well as prior PLIF L2-L5 with residual foraminal protrusion L3/5 potentially irritating right L3 nerve. She was started on IV Dilaudid, gabapentin as well as IV Decadron for management of pain,  headaches and severe neuropathy.  She continues to have pain in RLE but is having some motor return, has constant headache, decreased hearing in right> left ear, constipation with nausea as well as epigastric pain and weakness. Therapy ongoing and CIR recommended due to functional decline.  Of note, she reports 50 lbs weigh loss in the past 3-4 months--due to issues with N/V/intake. Has had two falls last month---no recall of incident leading to fall question due to syncope. Was in process of work up for renal disease. She has had issue with LLE weakness with pain for the past year and being followed by Dr. Ernestina Patches. Did well with stimulator trial.    How long can you sit comfortably? 1 hour (no recent change)    How long can you stand comfortably? 8 mins (no recent change)    How long can you walk comfortably? 20-30 minutes (no recent change)    Currently in Pain? Yes    Pain Score 8     Pain Location Back    Pain Orientation Lower    Pain Descriptors / Indicators Aching;Sore;Constant    Pain Type Acute pain    Pain Onset 1 to 4 weeks ago    Pain Frequency Constant    Aggravating Factors  standing >2-3 min, weight bearing, walking/standing    Pain Relieving Factors flexed posture    Effect of Pain on Daily Activities decreased activity tolerance;    Multiple Pain Sites No                 Patient required min A to transition sit to supine on mat table with LE elevated into hooklying position: Moist heat to low back for comfort concurrent with exercise:    Hooklying: Gluteal squeeze 5 sec hold, submax contraction for warm up and pain tolerance x10 reps Posterior pelvic tilt 5 sec hold x10 reps; Patient instructed in diaphragmatic breathing for better abdominal activation;    PT applied [place] under BLE: -Alternate SAQ x10 reps each LE with cues to isolate quad contraction and avoid hip activation for better knee stabilization; Patient does exhibit increased difficulty with RLE with  increased shakiness and decreased ROM;  -lumbar trunk rotation to left and midline x2 min with therapist assist for proper positioning; -double knee to chest AAROM x10 reps;   All exercises performed within pain-free or pain limited range.  Patient reports improvement in pain at end of session. She required min A to transition supine to sitting with cues for log roll technique;    Pt educated throughout session about proper posture and technique with exercises. Improved exercise technique, movement at target joints, use of target muscles after min to mod verbal, visual, tactile cues.  PT Education - 07/06/21 1118     Education Details exercise technique, positioning    Person(s) Educated Patient    Methods Explanation;Verbal cues    Comprehension Returned demonstration;Verbal cues required;Verbalized understanding;Need further instruction              PT Short Term Goals - 03/22/21 1713       PT SHORT TERM GOAL #1   Title Pt will be independent with HEP in order to improve strength and balance in order to decrease fall risk and improve function at home and work.    Baseline 10/14/2020 on-going/deferred; 10/28/2020 ongoing pt has resumed HEP, reports doing what she can/HEP to be advanced; 11/18/2020  Pt has had trouble doing HEP d/t fatigue.; 5/23: Pt reports independence    Time 6    Period Weeks    Status Achieved    Target Date 01/24/21               PT Long Term Goals - 04/28/21 9381       PT LONG TERM GOAL #1   Title Patient (< 65 years old) will complete five times sit to stand test in < 10 seconds indicating an increased LE strength and improved balance.    Baseline 12/29: 38.45s; 10/14/2020 42.8 seconds; 10/28/2020 36 seconds; 11/18/2020 34.84 seconds; 12/30/20 41.55sec; 5/23: 27.2 sec 7/19: 23 seconds; 04/28/2021= 16.70 sec with min UE support    Time 12    Period Weeks    Status Partially Met    Target Date 07/21/21       PT LONG TERM GOAL #2   Title Pt will decrease TUG to below 14 seconds/decrease in order to demonstrate decreased fall risk.    Baseline 12/29: 32.94s; 10/14/2020 43.12 sec; 10/28/2020 34.12 seconds; 11/18/2020 26.5 sec; 12/30/20: 31sec c SPC; 5/23: 24.6 with SPC 7/19: 20.4 seconds. 04/28/2021= 18.83 sec using SPC    Time 12    Period Weeks    Status Partially Met    Target Date 07/21/21      PT LONG TERM GOAL #3   Title Pt will decrease DHI score by at least 18 points in order to demonstrate clinically significant reduction in disability    Baseline 12/29: 30; 10/14/2020 62, indicating severe handicap; 10/28/2020 74' 5/23: 72 indicating severe handicap 7/20: 62; 04/28/2021= 54 indicating improvement from severe perception to moderate perception of handicap.   Not reassessed, but reports no significant change/improvement with dizziness   Time 12    Period Weeks    Status Partially Met    Target Date 07/21/21      PT LONG TERM GOAL #4   Title Patient will increase 10 meter walk test to >1.56ms as to improve gait speed for better community ambulation and to reduce fall risk.    Baseline 12/29: 0.37 m/s; 0.38 m/s; 2/10/08/2020 0.31 m/s 11/18/2020 0.34 m/s with SPC; 0.264m 12/30/20; 5/23[facility]: 0.59 m/s with cane; 04/28/2021= 0.75 m/s using SPC    Time 12    Period Weeks    Status Partially Met      PT LONG TERM GOAL #5   Title Patient will increase FOTO score to equal to or greater than 64 to demonstrate statistically significant improvement in mobility and quality of life.    Baseline 12/29: 52; 10/14/2020 45; 10/28/2020 45; 11/18/2020 45; 12/30/20 44; 5/23: 51 7/20: 39%; 04/28/2021= 51%    Time 12    Period Weeks    Status On-going  Target Date 07/21/21      Additional Long Term Goals   Additional Long Term Goals Yes      PT LONG TERM GOAL #6   Title Patient will increase six minute walk test distance to >1000 for progression to community ambulator and improve gait ability     Baseline 6/6: 788 ft with [facility] 7/19: 620 ft limited by pain; 04/28/2021= 760 feet with SPC    Time 12    Period Weeks    Status On-going    Target Date 07/21/21      PT LONG TERM GOAL #7   Title Patient will ascend/descend 2- 3 steps with 1 rail and use of SPC with modified [place] in front of house to safely enter/exit home.    Baseline 04/28/2021 - Patient currently utilizing back steps (2) secondary to fear of falling using deeper steps in front of house.                   Plan - 07/07/21 5300     Clinical Impression Statement Patient motivated and participated fair within session. Patient continues to have increased back pain. She did see MD who is going to order a new MRI. She is also planning to get a second opinion. Therapy was limited to hooklying exercise in painfree ROM for better tolerance. She required min VCs for proper positioning including to activate core abdominal muscles with LE exercise for better tolerance. Patient reports intermittent numbness in RLE with certain positions. She would benefit from additional skilled PT Intervention to improve core strength and support lumbopelvic girdle to help with pain management. Patient has 2 more visits approved on her insurance.    Personal Factors and Comorbidities Comorbidity 3+;Time since onset of injury/illness/exacerbation    Comorbidities HYPOtension, paraparesis, SCI without spinal bone injury, DDD, cervical cancer, acute kidney injury, GERD, asthma, bradycardia, L total hip replacement, hypokalemia    Examination-Activity Limitations Squat;Lift;Stairs;Bend;Stand;Reach Overhead;Carry;Transfers    Examination-Participation Restrictions Cleaning;Laundry;Meal Prep;Community Activity;Driving;Occupation    Stability/Clinical Decision Making Evolving/Moderate complexity    Rehab Potential Fair    PT Frequency Other (comment)   1x/every other week   PT Duration 12 weeks    PT Treatment/Interventions ADLs/Self Care Home  Management;Aquatic Therapy;Gait training;Stair training;Functional mobility training;Therapeutic activities;Therapeutic exercise;Balance training;Moist Heat;Manual techniques;Patient/family education;Neuromuscular re-education;Passive range of motion;Energy conservation;Vestibular;Joint Manipulations;Spinal Manipulations    PT Next Visit Plan endurance, dynamic and static balance,endurance, treadmill, leg press.Patient has 7 visits left per ins and will plan for 1x/week every other week through end of cert as needed to achieve goals.    PT Home Exercise Plan Instructed in gentle cervical ROM activities, progress strengthening exercises as patient is able    Consulted and Agree with Plan of Care Patient             Patient will benefit from skilled therapeutic intervention in order to improve the following deficits and impairments:  Abnormal gait, Dizziness, Improper body mechanics, Decreased mobility, Cardiopulmonary status limiting activity, Decreased activity tolerance, Decreased endurance, Decreased range of motion, Decreased strength, Decreased balance, Decreased safety awareness, Difficulty walking, Impaired flexibility, Obesity  Visit Diagnosis: Muscle weakness (generalized)  Other abnormalities of gait and mobility  Unsteadiness on feet  Chronic midline low back pain, unspecified whether sciatica present  Difficulty in walking, not elsewhere classified  Other lack of coordination     Problem List Patient Active Problem List   Diagnosis Date Noted   Abnormality of gait 04/29/2021   Abnormal sensation in both ears 04/06/2021  Other adverse food reactions, not elsewhere classified, subsequent encounter 04/06/2021   Multiple drug allergies 04/06/2021   Insomnia due to medical condition 02/25/2021   Myofascial muscle pain 02/25/2021   Nerve pain 10/18/2020   Incomplete paraplegia ([facility]) 10/18/2020   Orthostatic hypotension 10/18/2020   Chronic migraine without aura without  status migrainosus, not intractable    Hypokalemia    Adjustment reaction with anxiety and depression    Spinal cord injury, lumbar, without spinal bone injury, sequela ([place]) 08/18/2020   Paraparesis ([place])    Post-operative pain    Hypotension    Slow transit constipation    AKI (acute kidney injury) ([place])    Right leg weakness 08/11/2020   DDD (degenerative disc disease), lumbar 09/02/2019    Class: Chronic   Degenerative disc disease, lumbar 09/02/2019   Chest tightness 09/23/2018   Bradycardia 09/23/2018   Labile blood pressure 03/08/2018   Chronic low back pain 09/03/2017   History of total hip replacement, left 01/26/2016   EDEMA 04/28/2008   LEG PAIN, BILATERAL 12/16/2007   CERVICAL CANCER 07/02/2007   MORBID OBESITY 07/02/2007   DEPRESSION 07/02/2007   COMMON MIGRAINE 07/02/2007   Other allergic rhinitis 07/02/2007   ASTHMA 07/02/2007   GERD 07/02/2007   ELEVATED BLOOD PRESSURE WITHOUT DIAGNOSIS OF HYPERTENSION 07/02/2007    Shivonne Schwartzman, PT, DPT 07/07/2021, 8:26 AM  [place] [address] [place], [place], [address] Phone: [phone]   Fax:  [phone]  Name: Vanessa Medina MRN: [id number] Date of Birth: 05/11/1971

## 2021-07-11 ENCOUNTER — Other Ambulatory Visit: Payer: Self-pay

## 2021-07-11 ENCOUNTER — Ambulatory Visit
Admission: RE | Admit: 2021-07-11 | Discharge: 2021-07-11 | Disposition: A | Discharge status: Home / Self Care | Payer: 59 | Source: Ambulatory Visit | Attending: Specialist | Admitting: Specialist

## 2021-07-11 ENCOUNTER — Ambulatory Visit: Payer: 59

## 2021-07-11 DIAGNOSIS — Z4889 Encounter for other specified surgical aftercare: Secondary | ICD-10-CM | POA: Diagnosis present

## 2021-07-11 DIAGNOSIS — M4807 Spinal stenosis, lumbosacral region: Secondary | ICD-10-CM | POA: Diagnosis present

## 2021-07-13 ENCOUNTER — Ambulatory Visit: Payer: 59

## 2021-07-18 ENCOUNTER — Ambulatory Visit: Payer: 59

## 2021-07-20 ENCOUNTER — Ambulatory Visit: Payer: 59

## 2021-07-20 ENCOUNTER — Other Ambulatory Visit: Payer: Self-pay

## 2021-07-20 DIAGNOSIS — G8929 Other chronic pain: Secondary | ICD-10-CM

## 2021-07-20 DIAGNOSIS — R2689 Other abnormalities of gait and mobility: Secondary | ICD-10-CM

## 2021-07-20 DIAGNOSIS — M6281 Muscle weakness (generalized): Secondary | ICD-10-CM | POA: Diagnosis not present

## 2021-07-20 NOTE — Therapy (Signed)
[place] MAIN [facility] SERVICES [address] [place], [place], [address] Phone: [phone]   Fax:  [phone]  Physical Therapy Treatment  Patient Details  Name: Vanessa Medina MRN: [id number] Date of Birth: March 11, 1971 Referring Provider (PT): Dr. Erling Cruz   Encounter Date: 07/20/2021   PT End of Session - 07/21/21 0812     Visit Number 78    Number of Visits 60    Date for PT Re-Evaluation 07/22/21    Authorization Type U[employer] Other    Authorization Time Period 01/24/21-04/18/2021    PT Start Time 1436    PT Stop Time 1515    PT Time Calculation (min) 39 min    Equipment Utilized During Treatment Gait belt    Activity Tolerance Patient tolerated treatment well;Patient limited by pain    Behavior During Therapy [facility] for tasks assessed/performed             Past Medical History:  Diagnosis Date   Anemia    Cervical cancer ([place])    Family history of adverse reaction to anesthesia     " my mother takes a long time time wake up"   GERD (gastroesophageal reflux disease)    Headache    migraine   History of dysplastic nevus 01/18/2018   right shoulder, recurrent dysplastic nevus   History of shingles    Hypertension    Migraine    Multiple allergies    Obesity    Osteoarthritis    left hip   PONV (postoperative nausea and vomiting)    Sleep apnea    does not wear CPAP   Stroke ([place]) 08/11/2020   Wears glasses     Past Surgical History:  Procedure Laterality Date   ABDOMINAL HYSTERECTOMY     APPENDECTOMY     BACK SURGERY     BILIOPANCREATIC DIVERSION     with duodenal switch laparoscopic    CHOLECYSTECTOMY     COLONOSCOPY N/A 06/27/2021   Procedure: COLONOSCOPY;  Surgeon: Annamaria Helling, DO;  Location: [facility] ENDOSCOPY;  Service: Gastroenterology;  Laterality: N/A;   DIAGNOSTIC LAPAROSCOPY     LOA   ESOPHAGOGASTRODUODENOSCOPY N/A 06/27/2021   Procedure: ESOPHAGOGASTRODUODENOSCOPY (EGD);  Surgeon: Annamaria Helling, DO;  Location[facility] ENDOSCOPY;  Service: Gastroenterology;  Laterality: N/A;   ESOPHAGOGASTRODUODENOSCOPY (EGD) WITH PROPOFOL N/A 07/25/2017   Procedure: ESOPHAGOGASTRODUODENOSCOPY (EGD) WITH PROPOFOL;  Surgeon: Manya Silvas, MD;  Location: [facility] ENDOSCOPY;  Service: Endoscopy;  Laterality: N/A;   KNEE ARTHROSCOPY     LAPAROSCOPIC GASTRIC SLEEVE RESECTION WITH HIATAL HERNIA REPAIR     TONSILLECTOMY     TOTAL HIP ARTHROPLASTY Left 01/26/2016   Procedure: LEFT TOTAL HIP ARTHROPLASTY ANTERIOR APPROACH;  Surgeon: Leandrew Koyanagi, MD;  Location: [facility];  Service: Orthopedics;  Laterality: Left;   TRANSFORAMINAL LUMBAR INTERBODY FUSION (TLIF) WITH PEDICLE SCREW FIXATION 3 LEVEL  08/2019   Basil Dess, MD L2-L5    There were no vitals filed for this visit.   Subjective Assessment - 07/20/21 1440     Subjective Pt reports she has had continued back pain. Pt reports she has not been able to do much, including walking due to back pain. Pt reports she would like to be able to come to PT more often because she felt it was helping, but insurance has limited number of visits.    Pertinent History 50 year old female with history of HTN, migraines, morbid obesity--BMI-41, chronic hypotension, multiple back surgeries with chronic pain  with LLE lumbar radiculopathy who was scheduled to have spinal cord stimulator placed by Dr. Davy Pique on 08/11/2020 but unable to perform surgery due to scar tissue.  History taken from patient and chart review.  Post op on awakening, she had excruciating pain RLE with plegia and and allodynia. She was admitted for work up by Dr. Ronnald Ramp from surgical center on 08/11/2020. Thoracic MRI done revealing subtle increased T2/STIR intensity distal cord at T12-L1 level suspicious for edema/acute spinal cord injury, no CSF leak as well as prior PLIF L2-L5 with residual foraminal protrusion L3/5 potentially irritating right L3 nerve. She was started on IV Dilaudid, gabapentin as well as IV  Decadron for management of pain, headaches and severe neuropathy.  She continues to have pain in RLE but is having some motor return, has constant headache, decreased hearing in right> left ear, constipation with nausea as well as epigastric pain and weakness. Therapy ongoing and CIR recommended due to functional decline.  Of note, she reports 50 lbs weigh loss in the past 3-4 months--due to issues with N/V/intake. Has had two falls last month---no recall of incident leading to fall question due to syncope. Was in process of work up for renal disease. She has had issue with LLE weakness with pain for the past year and being followed by Dr. Ernestina Patches. Did well with stimulator trial.    How long can you sit comfortably? 1 hour (no recent change)    How long can you stand comfortably? 8 mins (no recent change)    How long can you walk comfortably? 20-30 minutes (no recent change)    Currently in Pain? Yes    Pain Location Back    Pain Onset 1 to 4 weeks ago              INTERVENTION   Hot pack applied to low back throughout session, where pt reports heat feels good to low back musculature. No adverse reaction to treatment.  Seated Pball rollouts - forward/backward, side-to-side within pain-free range and to promote increased mobility x multiple reps. Pt reports forward flexion is relieving position.  Seated hamstring stretch with LE supported on 6" step - 60 sec each LE; more range on RLE compared to LLE.  Pt transitions from seated to hooklying with CGA, continued use of heat to low back. PT assists pt through the following: Green p.ball placed under BLEs as this improves pt's pain LTRs - 2x20 within pain-free/pain minimal range (pt exhibits decreased pain-free range) SAQ with BLEs supported on p.ball 10x B; R LE tremulous.  Hamstring curls on green p.ball within pain-free range (performs through decreased ROM) x multiple reps.  SAQ 10x each LE- after reps RLE with increased tremors x 1 min.  PT assists pt RLE into full knee extension and resting on table to assist with decreasing tremors.  Ankle pumps 20x each LE Glute sets 2x10; pt attempts to perform PPT with glute set but reports increase in LBP so discontinued.   Pt mod I transitioning from supine>side-lying>sitting at edge of table (increased time due to pain)  Min a for pt to transition from seated at edge of table to standing, CGA then provided for pt to go from stand>sitting in transport chair   Pt educated throughout session about proper posture and technique with exercises. Improved exercise technique, movement at target joints, use of target muscles after min to mod verbal, visual, tactile cues.    PT Education - 07/21/21 0810     Education Details  pain modulation techniques/gentle supine mobility exercise technique    Person(s) Educated Patient    Methods Explanation;Demonstration;Tactile cues;Verbal cues    Comprehension Verbalized understanding;Returned demonstration;Need further instruction              PT Short Term Goals - 03/22/21 1713       PT SHORT TERM GOAL #1   Title Pt will be independent with HEP in order to improve strength and balance in order to decrease fall risk and improve function at home and work.    Baseline 10/14/2020 on-going/deferred; 10/28/2020 ongoing pt has resumed HEP, reports doing what she can/HEP to be advanced; 11/18/2020  Pt has had trouble doing HEP d/t fatigue.; 5/23: Pt reports independence    Time 6    Period Weeks    Status Achieved    Target Date 01/24/21               PT Long Term Goals - 04/28/21 8032       PT LONG TERM GOAL #1   Title Patient (< 68 years old) will complete five times sit to stand test in < 10 seconds indicating an increased LE strength and improved balance.    Baseline 12/29: 38.45s; 10/14/2020 42.8 seconds; 10/28/2020 36 seconds; 11/18/2020 34.84 seconds; 12/30/20 41.55sec; 5/23: 27.2 sec 7/19: 23 seconds; 04/28/2021= 16.70 sec with min UE  support    Time 12    Period Weeks    Status Partially Met    Target Date 07/21/21      PT LONG TERM GOAL #2   Title Pt will decrease TUG to below 14 seconds/decrease in order to demonstrate decreased fall risk.    Baseline 12/29: 32.94s; 10/14/2020 43.12 sec; 10/28/2020 34.12 seconds; 11/18/2020 26.5 sec; 12/30/20: 31sec c SPC; 5/23: 24.6 with SPC 7/19: 20.4 seconds. 04/28/2021= 18.83 sec using SPC    Time 12    Period Weeks    Status Partially Met    Target Date 07/21/21      PT LONG TERM GOAL #3   Title Pt will decrease DHI score by at least 18 points in order to demonstrate clinically significant reduction in disability    Baseline 12/29: 30; 10/14/2020 62, indicating severe handicap; 10/28/2020 74' 5/23: 72 indicating severe handicap 7/20: 62; 04/28/2021= 54 indicating improvement from severe perception to moderate perception of handicap.   Not reassessed, but reports no significant change/improvement with dizziness   Time 12    Period Weeks    Status Partially Met    Target Date 07/21/21      PT LONG TERM GOAL #4   Title Patient will increase 10 meter walk test to >1.12ms as to improve gait speed for better community ambulation and to reduce fall risk.    Baseline 12/29: 0.37 m/s; 0.38 m/s; 2/10/08/2020 0.31 m/s 11/18/2020 0.34 m/s with SPC; 0.231m 12/30/20; 5[facility]23[facility] 0.59 m/s with cane; 04/28/2021= 0.75 m/s using SPC    Time 12    Period Weeks    Status Partially Met      PT LONG TERM GOAL #5   Title Patient will increase FOTO score to equal to or greater than 64 to demonstrate statistically significant improvement in mobility and quality of life.    Baseline 12/29: 52; 10/14/2020 45; 10/28/2020 45; 11/18/2020 45; 12/30/20 44; 5/23: 51 7/20: 39%; 04/28/2021= 51%    Time 12    Period Weeks    Status On-going    Target Date 07/21/21  Additional Long Term Goals   Additional Long Term Goals Yes      PT LONG TERM GOAL #6   Title Patient will increase six minute walk  test distance to >1000 for progression to community ambulator and improve gait ability    Baseline 6/6: 788 ft with [facility] 7/19: 620 ft limited by pain; 04/28/2021= 760 feet with SPC    Time 12    Period Weeks    Status On-going    Target Date 07/21/21      PT LONG TERM GOAL #7   Title Patient will ascend/descend 2- 3 steps with 1 rail and use of SPC with modified [place] in front of house to safely enter/exit home.    Baseline 04/28/2021 - Patient currently utilizing back steps (2) secondary to fear of falling using deeper steps in front of house.                   Plan - 07/21/21 0818     Clinical Impression Statement Pt continues to be highly motivated to participate in PT, but is limited in interventions she can perform due to increased LBP. Since pt has decreased frequency in PT visits due to insurance limitations, pt has experienced a decrease in overall functional mobility. Due to these limitations, session focused on performing and discussing pain modulation techniques with gentle hooklying mobility interventions performed within a pain-free range. The pt has 1 remaining visit approved on her insurance. The pt would benefit from further skilled PT to reduce pain, improve strength, and general mobility to increase QOL and ease and safety with ADLs.    Personal Factors and Comorbidities Comorbidity 3+;Time since onset of injury/illness/exacerbation    Comorbidities HYPOtension, paraparesis, SCI without spinal bone injury, DDD, cervical cancer, acute kidney injury, GERD, asthma, bradycardia, L total hip replacement, hypokalemia    Examination-Activity Limitations Squat;Lift;Stairs;Bend;Stand;Reach Overhead;Carry;Transfers    Examination-Participation Restrictions Cleaning;Laundry;Meal Prep;Community Activity;Driving;Occupation    Stability/Clinical Decision Making Evolving/Moderate complexity    Rehab Potential Fair    PT Frequency Other (comment)   1x/every other week   PT  Duration 12 weeks    PT Treatment/Interventions ADLs/Self Care Home Management;Aquatic Therapy;Gait training;Stair training;Functional mobility training;Therapeutic activities;Therapeutic exercise;Balance training;Moist Heat;Manual techniques;Patient/family education;Neuromuscular re-education;Passive range of motion;Energy conservation;Vestibular;Joint Manipulations;Spinal Manipulations    PT Next Visit Plan endurance, dynamic and static balance,endurance, treadmill, leg press.Patient has 7 visits left per ins and will plan for 1x/week every other week through end of cert as needed to achieve goals.    PT Home Exercise Plan Instructed in gentle cervical ROM activities, progress strengthening exercises as patient is able    Consulted and Agree with Plan of Care Patient             Patient will benefit from skilled therapeutic intervention in order to improve the following deficits and impairments:  Abnormal gait, Dizziness, Improper body mechanics, Decreased mobility, Cardiopulmonary status limiting activity, Decreased activity tolerance, Decreased endurance, Decreased range of motion, Decreased strength, Decreased balance, Decreased safety awareness, Difficulty walking, Impaired flexibility, Obesity  Visit Diagnosis: Chronic midline low back pain, unspecified whether sciatica present  Muscle weakness (generalized)  Other abnormalities of gait and mobility     Problem List Patient Active Problem List   Diagnosis Date Noted   Abnormality of gait 04/29/2021   Abnormal sensation in both ears 04/06/2021   Other adverse food reactions, not elsewhere classified, subsequent encounter 04/06/2021   Multiple drug allergies 04/06/2021   Insomnia due to medical condition 02/25/2021  Myofascial muscle pain 02/25/2021   Nerve pain 10/18/2020   Incomplete paraplegia ([place]) 10/18/2020   Orthostatic hypotension 10/18/2020   Chronic migraine without aura without status migrainosus, not intractable     Hypokalemia    Adjustment reaction with anxiety and depression    Spinal cord injury, lumbar, without spinal bone injury, sequela ([place]) 08/18/2020   Paraparesis ([place])    Post-operative pain    Hypotension    Slow transit constipation    AKI (acute kidney injury) ([place])    Right leg weakness 08/11/2020   DDD (degenerative disc disease), lumbar 09/02/2019    Class: Chronic   Degenerative disc disease, lumbar 09/02/2019   Chest tightness 09/23/2018   Bradycardia 09/23/2018   Labile blood pressure 03/08/2018   Chronic low back pain 09/03/2017   History of total hip replacement, left 01/26/2016   EDEMA 04/28/2008   LEG PAIN, BILATERAL 12/16/2007   CERVICAL CANCER 07/02/2007   MORBID OBESITY 07/02/2007   DEPRESSION 07/02/2007   COMMON MIGRAINE 07/02/2007   Other allergic rhinitis 07/02/2007   ASTHMA 07/02/2007   GERD 07/02/2007   ELEVATED BLOOD PRESSURE WITHOUT DIAGNOSIS OF HYPERTENSION 07/02/2007    Zollie Pee, PT 07/21/2021, 8:25 AM  [facility] [address] [place], [place], [address] Phone: [phone]   Fax:  [phone]  Name: LOVETTA CONDIE MRN: [id number] Date of Birth: 1971-09-01

## 2021-07-25 ENCOUNTER — Ambulatory Visit: Payer: 59

## 2021-07-26 ENCOUNTER — Other Ambulatory Visit: Payer: Self-pay | Admitting: Orthopaedic Surgery

## 2021-07-26 DIAGNOSIS — M4326 Fusion of spine, lumbar region: Secondary | ICD-10-CM

## 2021-07-27 ENCOUNTER — Encounter: Payer: Self-pay | Admitting: Physical Medicine and Rehabilitation

## 2021-07-27 ENCOUNTER — Ambulatory Visit: Payer: 59

## 2021-07-27 ENCOUNTER — Other Ambulatory Visit: Payer: Self-pay

## 2021-07-27 ENCOUNTER — Encounter: Payer: 59 | Attending: Physical Medicine and Rehabilitation | Admitting: Physical Medicine and Rehabilitation

## 2021-07-27 VITALS — BP 105/70 | HR 97 | Temp 98.3°F | Ht 64.0 in | Wt 265.0 lb

## 2021-07-27 DIAGNOSIS — M419 Scoliosis, unspecified: Secondary | ICD-10-CM | POA: Insufficient documentation

## 2021-07-27 DIAGNOSIS — Z6841 Body Mass Index (BMI) 40.0 and over, adult: Secondary | ICD-10-CM | POA: Diagnosis not present

## 2021-07-27 DIAGNOSIS — W108XXD Fall (on) (from) other stairs and steps, subsequent encounter: Secondary | ICD-10-CM | POA: Insufficient documentation

## 2021-07-27 DIAGNOSIS — R262 Difficulty in walking, not elsewhere classified: Secondary | ICD-10-CM | POA: Diagnosis not present

## 2021-07-27 DIAGNOSIS — G8222 Paraplegia, incomplete: Secondary | ICD-10-CM | POA: Insufficient documentation

## 2021-07-27 DIAGNOSIS — M5441 Lumbago with sciatica, right side: Secondary | ICD-10-CM | POA: Insufficient documentation

## 2021-07-27 DIAGNOSIS — R42 Dizziness and giddiness: Secondary | ICD-10-CM | POA: Diagnosis not present

## 2021-07-27 DIAGNOSIS — R531 Weakness: Secondary | ICD-10-CM | POA: Diagnosis not present

## 2021-07-27 DIAGNOSIS — E669 Obesity, unspecified: Secondary | ICD-10-CM | POA: Diagnosis not present

## 2021-07-27 DIAGNOSIS — G8929 Other chronic pain: Secondary | ICD-10-CM | POA: Diagnosis not present

## 2021-07-27 DIAGNOSIS — G8220 Paraplegia, unspecified: Secondary | ICD-10-CM

## 2021-07-27 DIAGNOSIS — M5442 Lumbago with sciatica, left side: Secondary | ICD-10-CM | POA: Insufficient documentation

## 2021-07-27 DIAGNOSIS — M79661 Pain in right lower leg: Secondary | ICD-10-CM | POA: Diagnosis not present

## 2021-07-27 DIAGNOSIS — R200 Anesthesia of skin: Secondary | ICD-10-CM | POA: Insufficient documentation

## 2021-07-27 DIAGNOSIS — R251 Tremor, unspecified: Secondary | ICD-10-CM | POA: Diagnosis not present

## 2021-07-27 DIAGNOSIS — M79662 Pain in left lower leg: Secondary | ICD-10-CM | POA: Insufficient documentation

## 2021-07-27 DIAGNOSIS — M549 Dorsalgia, unspecified: Secondary | ICD-10-CM | POA: Diagnosis not present

## 2021-07-27 NOTE — Patient Instructions (Addendum)
Pt is a 50 yr old female with paraparesis/incomplete paraplegia due to attempted spinal cord stimulator placement- With chronic hx of orthostasis, BMI of 41 ( up to 45.50 today) s/p gastric sleeve, Acute on chronic renal issues, here for f/u on SCI and orthostatic hypotension. Vestibular issues due to trigger points and central issues.   Here for f/u on SCI and new back pain since fall.    Will increase Mon/Tuesday/Thursday and Friday to 8 hours and have Wednesday off with 1 hour for lunch on days she works-   2. Will fill out new restrictions for work.   3. Thinks has some Tramadol at home- 50 mg 3-4x/day as needed- and can use up to 2 tabs/100 mg 3x/day for pain -  Thinks has bottle full- let me know how it goes.  To at least take the edge off- and get pain under a little better control- opiate contract and UDS next visit if take over doing pain meds for her.    4. Will cause constipation- so I would senokot or miralax for that. They are over the counter.    5. Con't Florinef and Midodrine  No changes.    6. Will let me know how Tramadol helps- don't want to go to Norco, because can be too sedating.    7. F/U in 3 months. Double appt- trigger point injections and f/u on SCI.

## 2021-07-27 NOTE — Progress Notes (Deleted)
   Subjective:    Patient ID: Vanessa Medina, female    DOB: 1971/04/11, 50 y.o.   MRN: [id number]  HPI    Review of Systems     Objective:   Physical Exam        Assessment & Plan:

## 2021-07-27 NOTE — Progress Notes (Signed)
Subjective:    Patient ID: Vanessa Medina, female    DOB: August 27, 1971, 50 y.o.   MRN: [id number]  HPI Pt is a 50 yr old female with paraparesis/incomplete paraplegia due to attempted spinal cord stimulator placement- With chronic hx of orthostasis, BMI of 41 ( up to 45.50 today) s/p gastric sleeve, Acute on chronic renal issues, here for f/u on SCI and orthostatic hypotension. Vestibular issues due to trigger points and central issues.   Here for f/u on SCI and new back pain since fall.   Wearing flipflops- doesn't like shoes.   Had a fall in September- the day after saw me- bad fall-  Passed out-  Hit concrete- hit object when went down- a steel bench-  Black and blue and scraped up- and really sore and hit side of L neck on bench.  Declined in function- walked in here today with quad cane, but very slow.    Doesn't take anything for pain- just Duloxetine, Gabapentin, and Robaxin-  The only way to feel relief- is to lean over and bend over- and feels like that's where we started initially  Saw Dr Louanne Skye- last month-  Also saw spine and scoliosis specialist- saw the PA for 2nd opinion- have ordered a CT-     Feels like cannot keep going- feels like Dr Louanne Skye says there's nothing that can be done . They looked at MRI, but they still wanted a CT scan- and it's on order. To occur 08/04/21.    Tried: Thinks she's tried Tramadol;  And Norco- has some at home.  Pain meds in past makes her too groggy in the AM.   Only uses heating pad right now.  Hasn't tried any other meds.   Still working 6 hours on Monday/Tuesday, Thursday and Friday.   Sitting is killing her.      Pain Inventory Average Pain 9 Pain Right Now 8 My pain is constant, sharp, and aching  LOCATION OF PAIN  back & right leg  BOWEL Number of stools per week: 7 or more Oral laxative use No    BLADDER Normal    Mobility use a cane use a walker how many minutes can you walk? 20 ability to climb  steps?  yes do you drive?  yes Do you have any goals in this area?  yes  Function employed # of hrs/week 24 [employer]  Neuro/Psych weakness numbness tremor trouble walking dizziness  Prior Studies Any changes since last visit?  yes, [facility]  Physicians involved in your care Any changes since last visit?  yes, [facility]   Family History  Problem Relation Age of Onset   Renal Disease Mother    Hypertension Mother    Sudden Cardiac Death Mother    Heart failure Mother    Valvular heart disease Mother    Heart disease Mother    Stroke Brother    Heart attack Brother 76   Diabetes Other    Breast cancer Cousin        paternal side   Social History   Socioeconomic History   Marital status: Divorced    Spouse name: Not on file   Number of children: 1   Years of education: Not on file   Highest education level: Not on file  Occupational History   Not on file  Tobacco Use   Smoking status: Never   Smokeless tobacco: Never  Vaping Use   Vaping Use: Never used  Substance and  Sexual Activity   Alcohol use: Yes    Comment: occasional wine   Drug use: No   Sexual activity: Not Currently  Other Topics Concern   Not on file  Social History Narrative   Right handed    Uses cane to walk    Social Determinants of Health   Financial Resource Strain: Not on file  Food Insecurity: Not on file  Transportation Needs: Not on file  Physical Activity: Not on file  Stress: Not on file  Social Connections: Not on file   Past Surgical History:  Procedure Laterality Date   ABDOMINAL HYSTERECTOMY     APPENDECTOMY     BACK SURGERY     BILIOPANCREATIC DIVERSION     with duodenal switch laparoscopic    CHOLECYSTECTOMY     COLONOSCOPY N/A 06/27/2021   Procedure: COLONOSCOPY;  Surgeon: Annamaria Helling, DO;  Location: [place];  Service: Gastroenterology;  Laterality: N/A;   DIAGNOSTIC LAPAROSCOPY     LOA   ESOPHAGOGASTRODUODENOSCOPY N/A  06/27/2021   Procedure: ESOPHAGOGASTRODUODENOSCOPY (EGD);  Surgeon: Annamaria Helling, DO;  Location: [facility] ENDOSCOPY;  Service: Gastroenterology;  Laterality: N/A;   ESOPHAGOGASTRODUODENOSCOPY (EGD) WITH PROPOFOL N/A 07/25/2017   Procedure: ESOPHAGOGASTRODUODENOSCOPY (EGD) WITH PROPOFOL;  Surgeon: Manya Silvas, MD;  Location: [facility] ENDOSCOPY;  Service: Endoscopy;  Laterality: N/A;   KNEE ARTHROSCOPY     LAPAROSCOPIC GASTRIC SLEEVE RESECTION WITH HIATAL HERNIA REPAIR     TONSILLECTOMY     TOTAL HIP ARTHROPLASTY Left 01/26/2016   Procedure: LEFT TOTAL HIP ARTHROPLASTY ANTERIOR APPROACH;  Surgeon: Leandrew Koyanagi, MD;  Location: [place];  Service: Orthopedics;  Laterality: Left;   TRANSFORAMINAL LUMBAR INTERBODY FUSION (TLIF) WITH PEDICLE SCREW FIXATION 3 LEVEL  08/2019   Basil Dess, MD L2-L5   Past Medical History:  Diagnosis Date   Anemia    Cervical cancer [facility])    Family history of adverse reaction to anesthesia     " my mother takes a long time time wake up"   GERD (gastroesophageal reflux disease)    Headache    migraine   History of dysplastic nevus 01/18/2018   right shoulder, recurrent dysplastic nevus   History of shingles    Hypertension    Migraine    Multiple allergies    Obesity    Osteoarthritis    left hip   PONV (postoperative nausea and vomiting)    Sleep apnea    does not wear CPAP   Stroke ([place]) 08/11/2020   Wears glasses    BP 105/70   Pulse 97   Temp 98.3 F (36.8 C)   Ht 5\' 4"  (1.626 m)   Wt 265 lb (120.2 kg)   SpO2 98%   BMI 45.49 kg/m   Opioid Risk Score:   Fall Risk Score:  `1  Depression screen PHQ 2/9  Depression screen [facility] 2/9 04/29/2021 02/25/2021 10/18/2020 09/15/2020  Decreased Interest 0 0 0 0  Down, Depressed, Hopeless 0 0 0 0  PHQ - 2 Score 0 0 0 0  Altered sleeping - - - 0  Tired, decreased energy - - - 0  Change in appetite - - - 0  Feeling bad or failure about yourself  - - - 0  Trouble concentrating - - - 0  Moving slowly  or fidgety/restless - - - 1  Suicidal thoughts - - - 0  PHQ-9 Score - - - 1  Difficult doing work/chores - - - Somewhat difficult  Some recent data  might be hidden    Review of Systems  Musculoskeletal:  Positive for back pain and gait problem.       Pain right leg  All other systems reviewed and are negative.     Objective:   Physical Exam Awake, alert, appropriate, sitting on chair, flat; anxious affect, NAD Weight has increased from last visit- BMI up to 45 from 41.  Pain improved with Lumbar flexion Difficult- needs to rock to get into standing position form chair.  Very point TTP over lumbar and sacral spine- not in paraspinals.   Using quad cane Trigger points in L scalenes and L upper traps/levators.          Assessment & Plan:   Pt is a 50 yr old female with paraparesis/incomplete paraplegia due to attempted spinal cord stimulator placement- With chronic hx of orthostasis, BMI of 41 ( up to 45.50 today) s/p gastric sleeve, Acute on chronic renal issues, here for f/u on SCI and orthostatic hypotension. Vestibular issues due to trigger points and central issues.   Here for f/u on SCI and new back pain since fall.    Will increase Mon/Tuesday/Thursday and Friday to 8 hours and have Wednesday off with 1 hour for lunch on days she works-   2. Will fill out new restrictions for work.   3. Thinks has some Tramadol at home- 50 mg 3-4x/day as needed- and can use up to 2 tabs/100 mg 3x/day for pain -  Thinks has bottle full- let me know how it goes. To at least take the edge off- and get pain under a little better control- opiate contract and UDS next visit if take over doing pain meds for her.    4. Will cause constipation- so I would senokot or miralax for that. They are over the counter.    5. Con't Florinef and Midodrine  No changes.    6. Will let me know how Tramadol helps- don't want to go to Norco, because can be too sedating.     7. F/U in 3 months.  Double appt- trigger point injections and f/u on SCI.    I spent a total of 34 minutes on visit- discussing plan esp on pain meds and UDS/opiate contract need.

## 2021-08-01 ENCOUNTER — Ambulatory Visit: Payer: 59

## 2021-08-03 ENCOUNTER — Other Ambulatory Visit: Payer: Self-pay

## 2021-08-03 ENCOUNTER — Ambulatory Visit: Payer: 59

## 2021-08-03 ENCOUNTER — Ambulatory Visit: Payer: 59 | Admitting: Physical Medicine and Rehabilitation

## 2021-08-03 DIAGNOSIS — G8929 Other chronic pain: Secondary | ICD-10-CM

## 2021-08-03 DIAGNOSIS — R2681 Unsteadiness on feet: Secondary | ICD-10-CM

## 2021-08-03 DIAGNOSIS — R2689 Other abnormalities of gait and mobility: Secondary | ICD-10-CM

## 2021-08-03 DIAGNOSIS — M6281 Muscle weakness (generalized): Secondary | ICD-10-CM

## 2021-08-03 DIAGNOSIS — M545 Low back pain, unspecified: Secondary | ICD-10-CM

## 2021-08-03 DIAGNOSIS — M25561 Pain in right knee: Secondary | ICD-10-CM

## 2021-08-03 NOTE — Therapy (Signed)
[place] MAIN [facility] SERVICES [address] [place], [place], [address] Phone: [phone]   Fax:  [phone]  Physical Therapy Treatment/Physical Therapy Progress Note/DISCHARGE SUMMARY   Dates of reporting period  03/22/2021   to   08/03/2021   Patient Details  Name: Vanessa Medina MRN: [id number] Date of Birth: 1971-06-07 Referring Provider (PT): Dr. Erling Cruz   Encounter Date: 08/03/2021   PT End of Session - 08/03/21 1610     Visit Number 60    Number of Visits 25    Date for PT Re-Evaluation 07/22/21    Authorization Type [employer] Other    Authorization Time Period 01/24/21-04/18/2021    PT Start Time 1440    PT Stop Time 1515    PT Time Calculation (min) 35 min    Equipment Utilized During Treatment Gait belt    Activity Tolerance Patient tolerated treatment well;Patient limited by pain    Behavior During Therapy [facility] for tasks assessed/performed             Past Medical History:  Diagnosis Date   Anemia    Cervical cancer ([place])    Family history of adverse reaction to anesthesia     " my mother takes a long time time wake up"   GERD (gastroesophageal reflux disease)    Headache    migraine   History of dysplastic nevus 01/18/2018   right shoulder, recurrent dysplastic nevus   History of shingles    Hypertension    Migraine    Multiple allergies    Obesity    Osteoarthritis    left hip   PONV (postoperative nausea and vomiting)    Sleep apnea    does not wear CPAP   Stroke ([place]) 08/11/2020   Wears glasses     Past Surgical History:  Procedure Laterality Date   ABDOMINAL HYSTERECTOMY     APPENDECTOMY     BACK SURGERY     BILIOPANCREATIC DIVERSION     with duodenal switch laparoscopic    CHOLECYSTECTOMY     COLONOSCOPY N/A 06/27/2021   Procedure: COLONOSCOPY;  Surgeon: Annamaria Helling, DO;  Location: [facility] ENDOSCOPY;  Service: Gastroenterology;  Laterality: N/A;   DIAGNOSTIC LAPAROSCOPY     LOA    ESOPHAGOGASTRODUODENOSCOPY N/A 06/27/2021   Procedure: ESOPHAGOGASTRODUODENOSCOPY (EGD);  Surgeon: Annamaria Helling, DO;  Location: [facility] ENDOSCOPY;  Service: Gastroenterology;  Laterality: N/A;   ESOPHAGOGASTRODUODENOSCOPY (EGD) WITH PROPOFOL N/A 07/25/2017   Procedure: ESOPHAGOGASTRODUODENOSCOPY (EGD) WITH PROPOFOL;  Surgeon: Manya Silvas, MD;  Location: [facility] ENDOSCOPY;  Service: Endoscopy;  Laterality: N/A;   KNEE ARTHROSCOPY     LAPAROSCOPIC GASTRIC SLEEVE RESECTION WITH HIATAL HERNIA REPAIR     TONSILLECTOMY     TOTAL HIP ARTHROPLASTY Left 01/26/2016   Procedure: LEFT TOTAL HIP ARTHROPLASTY ANTERIOR APPROACH;  Surgeon: Leandrew Koyanagi, MD;  Location: [place];  Service: Orthopedics;  Laterality: Left;   TRANSFORAMINAL LUMBAR INTERBODY FUSION (TLIF) WITH PEDICLE SCREW FIXATION 3 LEVEL  08/2019   Basil Dess, MD L2-L5    There were no vitals filed for this visit.   Subjective Assessment - 08/03/21 1441     Subjective Pt reports she has a CT tomorrow and a follow-up with her new spine doctor on the 23rd of December. The pt reports she has now increased her work hours, but will still be able to take breaks as needed. She reports no medication changes. Pt has met with a personal trainer to discuss 1:1 training  and explained she had a discussion with her trainer regarding her concerns around her LBP, and that she would like to improve her strength and energy levels.    Pertinent History 50 year old female with history of HTN, migraines, morbid obesity--BMI-41, chronic hypotension, multiple back surgeries with chronic pain with LLE lumbar radiculopathy who was scheduled to have spinal cord stimulator placed by Dr. Davy Pique on 08/11/2020 but unable to perform surgery due to scar tissue.  History taken from patient and chart review.  Post op on awakening, she had excruciating pain RLE with plegia and and allodynia. She was admitted for work up by Dr. Ronnald Ramp from surgical center on 08/11/2020. Thoracic  MRI done revealing subtle increased T2/STIR intensity distal cord at T12-L1 level suspicious for edema/acute spinal cord injury, no CSF leak as well as prior PLIF L2-L5 with residual foraminal protrusion L3/5 potentially irritating right L3 nerve. She was started on IV Dilaudid, gabapentin as well as IV Decadron for management of pain, headaches and severe neuropathy.  She continues to have pain in RLE but is having some motor return, has constant headache, decreased hearing in right> left ear, constipation with nausea as well as epigastric pain and weakness. Therapy ongoing and CIR recommended due to functional decline.  Of note, she reports 50 lbs weigh loss in the past 3-4 months--due to issues with N/V/intake. Has had two falls last month---no recall of incident leading to fall question due to syncope. Was in process of work up for renal disease. She has had issue with LLE weakness with pain for the past year and being followed by Dr. Ernestina Patches. Did well with stimulator trial.    How long can you sit comfortably? 1 hour (no recent change)    How long can you stand comfortably? 8 mins (no recent change)    How long can you walk comfortably? 20-30 minutes (no recent change)    Currently in Pain? Yes    Pain Location Back    Pain Orientation Lower    Pain Onset 1 to 4 weeks ago    Multiple Pain Sites Yes    Pain Location Knee   R knee, chronic pain              INTERVENTIONS - GOALS reviewed for progress note and pt d/c from therapy (see goal section for details), as pt has reached yearly visit number allowed by insurance.   5xSTS: 25.16 sec  TUG: 23 sec DHI: 68, indicating moderate handicap (close to threshold for severe perception of handicap due to dizziness) 10MWT: 0.55 m/s with SPC FOTO: 28  Ascend/descend 2-3 steps with 1 rail and SPC: pt able to perform slowly, with recip pattern with ascending and step-to with descending, with use of UE support on handrail and on SPC. Pt performs 2  rounds. PT instructs pt to ascend steps leading with nonpainful LLE, and to descend steps leading with painful RLE. Cue used: "up with the good, down with the bad." Pt demo's correct technique.   Education provided throughout for technique with tests/measures and indications of tests and measures.   Education regarding continuing activity levels following d/c (see assessment for details)   Assessment: Reassessment of goals completed on this date for progress note, but also for discharge as patient has reached yearly visit limited per her insurance. Pt has seen decrease in majority of outcome scores, likely impacted by having to decrease PT frequency d/t insurance limitations and due to increased LBP following a fall on 04/30/2021 (  see note: [place], 05/11/2021). Pt performance on tests indicate pt with decreased BLE strength, increased perception of handicap due to dizziness, impairments of gait speed, QOL, and increased fall risk. While pt has experienced a decrease in functional mobility since she has not been able to come to PT as frequently, she still did show some improvement with ability to ascend/descend stair steps. PT provided pt education regarding continuing activity outside of therapy to improve strength. Pt is going to start working with a Physiological scientist. PT instructed pt to focus on performing mostly seated therex, and to discontinue exercises that increase her pain >3/10. If pt to attempt standing therex, pt suggests performing with UE support and near a chair, and to discontinue if feeling faint/dizzy or too challenging for pt's balance. Pt verbalized understanding.The pt would benefit from further skilled PT to improve functional mobility, strength, balance and to decrease fall risk. However, pt to be discharged on this date due to reaching yearly visit limit.      PT Education - 08/03/21 1610     Education Details Recommendations for continuing activity following D/C from PT until pt  is able to restart PT next year    Person(s) Educated Patient    Methods Explanation    Comprehension Verbalized understanding              PT Short Term Goals - 08/03/21 1611       PT SHORT TERM GOAL #1   Title Pt will be independent with HEP in order to improve strength and balance in order to decrease fall risk and improve function at home and work.    Baseline 10/14/2020 on-going/deferred; 10/28/2020 ongoing pt has resumed HEP, reports doing what she can/HEP to be advanced; 11/18/2020  Pt has had trouble doing HEP d/t fatigue.; 5/23: Pt reports independence    Time 6    Period Weeks    Status Achieved    Target Date 01/24/21               PT Long Term Goals - 08/03/21 1459       PT LONG TERM GOAL #1   Title Patient (< 31 years old) will complete five times sit to stand test in < 10 seconds indicating an increased LE strength and improved balance.    Baseline 12/29: 38.45s; 10/14/2020 42.8 seconds; 10/28/2020 36 seconds; 11/18/2020 34.84 seconds; 12/30/20 41.55sec; 5/23: 27.2 sec 7/19: 23 seconds; 04/28/2021= 16.70 sec with min UE support; 11/30: 25.16 sec with UE support, limited secondary to R knee pain    Time 12    Period Weeks    Status Partially Met      PT LONG TERM GOAL #2   Title Pt will decrease TUG to below 14 seconds/decrease in order to demonstrate decreased fall risk.    Baseline 12/29: 32.94s; 10/14/2020 43.12 sec; 10/28/2020 34.12 seconds; 11/18/2020 26.5 sec; 12/30/20: 31sec c SPC; 5/23: 24.6 with SPC 7/19: 20.4 seconds. 04/28/2021= 18.83 sec using SPC; 11/30: 23 sec SPC    Time 12    Period Weeks    Status Partially Met      PT LONG TERM GOAL #3   Title Pt will decrease DHI score by at least 18 points in order to demonstrate clinically significant reduction in disability    Baseline 12/29: 30; 10/14/2020 62, indicating severe handicap; 10/28/2020 74' 5/23: 72 indicating severe handicap 7/20: 62; 04/28/2021= 54 indicating improvement from severe perception to  moderate perception of handicap. 11/30: 68  Not reassessed, but reports no significant change/improvement with dizziness   Time 12    Period Weeks    Status Partially Met      PT LONG TERM GOAL #4   Title Patient will increase 10 meter walk test to >1.18ms as to improve gait speed for better community ambulation and to reduce fall risk.    Baseline 12/29: 0.37 m/s; 0.38 m/s; 2/10/08/2020 0.31 m/s 11/18/2020 0.34 m/s with SPC; 0.273m 12/30/20; 5[facility]23[facility]: 0.59 m/s with cane; 04/28/2021= 0.75 m/s using SPC; 11/30: 0.55 m/s with SPC    Time 12    Period Weeks    Status Partially Met      PT LONG TERM GOAL #5   Title Patient will increase FOTO score to equal to or greater than 64 to demonstrate statistically significant improvement in mobility and quality of life.    Baseline 12/29: 52; 10/14/2020 45; 10/28/2020 45; 11/18/2020 45; 12/30/20 44; 5/23: 51 7/20: 39%; 04/28/2021= 51%; 11/30: 28    Time 12    Period Weeks    Status On-going      PT LONG TERM GOAL #6   Title Patient will increase six minute walk test distance to >1000 for progression to community ambulator and improve gait ability    [facility] 620 ft limited by pain; 04/28/2021= 760 feet with SPC; 11/30: unable to assess due to time limitations    Time 12    Period Weeks    Status On-going      PT LONG TERM GOAL #7   Title Patient will ascend/descend 2- 3 steps with 1 rail and use of SPC with modified [place] in front of house to safely enter/exit home.    Baseline 04/28/2021 - Patient currently utilizing back steps (2) secondary to fear of falling using deeper steps in front of house. 11/30: pt able to perform slowly, with recip pattern with ascending and step-to with descending, with use of UE support on handrail and on SPC. Pt performs 2 rounds. PT instructs pt to ascend steps leading with nonpainful LLE, and to descend steps leading with painful RLE. Cue used: "up with the good, down with  the bad." Pt demo's correct technique.    Status Achieved                   Plan - 08/03/21 1621     Clinical Impression Statement Reassessment of goals completed on this date for progress note, but also for discharge as patient has reached yearly visit limited per her insurance. Pt has seen decrease in majority of outcome scores, likely impacted by having to decrease PT frequency d/t insurance limitations and due to increased LBP following a fall on 04/30/2021 (see note: [place], 05/11/2021). Pt performance on tests indicate pt with decreased BLE strength, increased perception of handicap due to dizziness, impairments of gait speed, QOL, and increased fall risk. While pt has experienced a decrease in functional mobility since she has not been able to come to PT as frequently, she still did show some improvement with ability to ascend/descend stair steps. PT provided pt education regarding continuing activity outside of therapy to improve strength. Pt is going to start working with a pePhysiological scientistPT instructed pt to focus on performing mostly seated therex, and to discontinue exercises that increase her pain >3/10. If pt to attempt standing therex, pt suggests performing with UE support and near a chair, and to discontinue if feeling  faint/dizzy or too challenging for pt's balance. Pt verbalized understanding.The pt would benefit from further skilled PT to improve functional mobility, strength, balance and to decrease fall risk. However, pt to be discharged on this date due to reaching yearly visit limit.    Personal Factors and Comorbidities Comorbidity 3+;Time since onset of injury/illness/exacerbation    Comorbidities HYPOtension, paraparesis, SCI without spinal bone injury, DDD, cervical cancer, acute kidney injury, GERD, asthma, bradycardia, L total hip replacement, hypokalemia    Examination-Activity Limitations Squat;Lift;Stairs;Bend;Stand;Reach Overhead;Carry;Transfers     Examination-Participation Restrictions Cleaning;Laundry;Meal Prep;Community Activity;Driving;Occupation    Stability/Clinical Decision Making Evolving/Moderate complexity    Rehab Potential Fair    PT Frequency Other (comment)   1x/every other week   PT Duration 12 weeks    PT Treatment/Interventions ADLs/Self Care Home Management;Aquatic Therapy;Gait training;Stair training;Functional mobility training;Therapeutic activities;Therapeutic exercise;Balance training;Moist Heat;Manual techniques;Patient/family education;Neuromuscular re-education;Passive range of motion;Energy conservation;Vestibular;Joint Manipulations;Spinal Manipulations    PT Next Visit Plan pt d/c on this date    PT Home Exercise Plan Instructed in gentle cervical ROM activities, progress strengthening exercises as patient is able; 11/30 see note regarding education about continuing activity beyond PT    Consulted and Agree with Plan of Care Patient             Patient will benefit from skilled therapeutic intervention in order to improve the following deficits and impairments:  Abnormal gait, Dizziness, Improper body mechanics, Decreased mobility, Cardiopulmonary status limiting activity, Decreased activity tolerance, Decreased endurance, Decreased range of motion, Decreased strength, Decreased balance, Decreased safety awareness, Difficulty walking, Impaired flexibility, Obesity  Visit Diagnosis: Chronic midline low back pain, unspecified whether sciatica present  Muscle weakness (generalized)  Other abnormalities of gait and mobility  Chronic pain of right knee  Unsteadiness on feet     Problem List Patient Active Problem List   Diagnosis Date Noted   Abnormality of gait 04/29/2021   Abnormal sensation in both ears 04/06/2021   Other adverse food reactions, not elsewhere classified, subsequent encounter 04/06/2021   Multiple drug allergies 04/06/2021   Insomnia due to medical condition 02/25/2021    Myofascial muscle pain 02/25/2021   Nerve pain 10/18/2020   Incomplete paraplegia ([place]) 10/18/2020   Orthostatic hypotension 10/18/2020   Chronic migraine without aura without status migrainosus, not intractable    Hypokalemia    Adjustment reaction with anxiety and depression    Spinal cord injury, lumbar, without spinal bone injury, sequela ([place]) 08/18/2020   Paraparesis ([place])    Post-operative pain    Hypotension    Slow transit constipation    AKI (acute kidney injury) ([place])    Right leg weakness 08/11/2020   DDD (degenerative disc disease), lumbar 09/02/2019    Class: Chronic   Degenerative disc disease, lumbar 09/02/2019   Chest tightness 09/23/2018   Bradycardia 09/23/2018   Labile blood pressure 03/08/2018   Chronic low back pain 09/03/2017   History of total hip replacement, left 01/26/2016   EDEMA 04/28/2008   LEG PAIN, BILATERAL 12/16/2007   CERVICAL CANCER 07/02/2007   MORBID OBESITY 07/02/2007   DEPRESSION 07/02/2007   COMMON MIGRAINE 07/02/2007   Other allergic rhinitis 07/02/2007   ASTHMA 07/02/2007   GERD 07/02/2007   ELEVATED BLOOD PRESSURE WITHOUT DIAGNOSIS OF HYPERTENSION 07/02/2007    Zollie Pee, PT 08/03/2021, 4:35 PM  [facility] MAIN [facility] SERVICES [address] [place], [place], [address] Phone: [phone]   Fax:  [phone]  Name: Vanessa Medina MRN: [id number] Date of  Birth: September 14, 1970

## 2021-08-04 ENCOUNTER — Ambulatory Visit
Admission: RE | Admit: 2021-08-04 | Discharge: 2021-08-04 | Disposition: A | Discharge status: Home / Self Care | Payer: 59 | Source: Ambulatory Visit | Attending: Orthopaedic Surgery | Admitting: Orthopaedic Surgery

## 2021-08-04 DIAGNOSIS — M4326 Fusion of spine, lumbar region: Secondary | ICD-10-CM

## 2021-08-05 ENCOUNTER — Ambulatory Visit: Payer: 59 | Admitting: Specialist

## 2021-08-08 ENCOUNTER — Ambulatory Visit: Payer: 59

## 2021-08-10 ENCOUNTER — Ambulatory Visit: Payer: 59

## 2021-08-15 ENCOUNTER — Ambulatory Visit: Payer: 59

## 2021-08-17 ENCOUNTER — Ambulatory Visit: Payer: 59

## 2021-08-22 ENCOUNTER — Ambulatory Visit: Payer: 59

## 2021-08-24 ENCOUNTER — Ambulatory Visit: Payer: 59

## 2021-08-31 ENCOUNTER — Ambulatory Visit: Payer: 59

## 2021-09-07 ENCOUNTER — Other Ambulatory Visit: Payer: Self-pay

## 2021-09-07 ENCOUNTER — Ambulatory Visit: Payer: 59

## 2021-09-07 ENCOUNTER — Ambulatory Visit: Payer: 59 | Attending: Physical Medicine and Rehabilitation | Admitting: Physical Therapy

## 2021-09-07 DIAGNOSIS — M545 Low back pain, unspecified: Secondary | ICD-10-CM | POA: Diagnosis present

## 2021-09-07 DIAGNOSIS — R262 Difficulty in walking, not elsewhere classified: Secondary | ICD-10-CM | POA: Diagnosis present

## 2021-09-07 DIAGNOSIS — G8929 Other chronic pain: Secondary | ICD-10-CM | POA: Insufficient documentation

## 2021-09-07 DIAGNOSIS — R2689 Other abnormalities of gait and mobility: Secondary | ICD-10-CM | POA: Insufficient documentation

## 2021-09-07 DIAGNOSIS — R2681 Unsteadiness on feet: Secondary | ICD-10-CM | POA: Insufficient documentation

## 2021-09-07 DIAGNOSIS — R278 Other lack of coordination: Secondary | ICD-10-CM | POA: Diagnosis present

## 2021-09-07 DIAGNOSIS — M6281 Muscle weakness (generalized): Secondary | ICD-10-CM | POA: Insufficient documentation

## 2021-09-07 NOTE — Therapy (Signed)
[place] MAIN [facility] SERVICES [address] [place], [place], [address] Phone: [phone]   Fax:  [phone]  Physical Therapy Evaluation  Patient Details  Name: Vanessa Medina MRN: [id number] Date of Birth: March 19, 1971 No data recorded  Encounter Date: 09/07/2021   PT End of Session - 09/07/21 1648     Visit Number 1    Number of Visits 24    Date for PT Re-Evaluation 11/30/21    Authorization Type U[employer] Other    Authorization Time Period 01/24/21-04/18/2021    PT Start Time 1608    PT Stop Time 1700    PT Time Calculation (min) 52 min    Equipment Utilized During Treatment Gait belt    Activity Tolerance Patient tolerated treatment well;Patient limited by pain    Behavior During Therapy WFL for tasks assessed/performed             Past Medical History:  Diagnosis Date   Anemia    Cervical cancer ([place])    Family history of adverse reaction to anesthesia     " my mother takes a long time time wake up"   GERD (gastroesophageal reflux disease)    Headache    migraine   History of dysplastic nevus 01/18/2018   right shoulder, recurrent dysplastic nevus   History of shingles    Hypertension    Migraine    Multiple allergies    Obesity    Osteoarthritis    left hip   PONV (postoperative nausea and vomiting)    Sleep apnea    does not wear CPAP   Stroke ([place]) 08/11/2020   Wears glasses     Past Surgical History:  Procedure Laterality Date   ABDOMINAL HYSTERECTOMY     APPENDECTOMY     BACK SURGERY     BILIOPANCREATIC DIVERSION     with duodenal switch laparoscopic    CHOLECYSTECTOMY     COLONOSCOPY N/A 06/27/2021   Procedure: COLONOSCOPY;  Surgeon: Annamaria Helling, DO;  Location: [facility] ENDOSCOPY;  Service: Gastroenterology;  Laterality: N/A;   DIAGNOSTIC LAPAROSCOPY     LOA   ESOPHAGOGASTRODUODENOSCOPY N/A 06/27/2021   Procedure: ESOPHAGOGASTRODUODENOSCOPY (EGD);  Surgeon: Annamaria Helling, DO;  Location:  [facility] ENDOSCOPY;  Service: Gastroenterology;  Laterality: N/A;   ESOPHAGOGASTRODUODENOSCOPY (EGD) WITH PROPOFOL N/A 07/25/2017   Procedure: ESOPHAGOGASTRODUODENOSCOPY (EGD) WITH PROPOFOL;  Surgeon: Manya Silvas, MD;  Location: [facility] ENDOSCOPY;  Service: Endoscopy;  Laterality: N/A;   KNEE ARTHROSCOPY     LAPAROSCOPIC GASTRIC SLEEVE RESECTION WITH HIATAL HERNIA REPAIR     TONSILLECTOMY     TOTAL HIP ARTHROPLASTY Left 01/26/2016   Procedure: LEFT TOTAL HIP ARTHROPLASTY ANTERIOR APPROACH;  Surgeon: Leandrew Koyanagi, MD;  Location: [place];  Service: Orthopedics;  Laterality: Left;   TRANSFORAMINAL LUMBAR INTERBODY FUSION (TLIF) WITH PEDICLE SCREW FIXATION 3 LEVEL  08/2019   Basil Dess, MD L2-L5    There were no vitals filed for this visit.    Subjective Assessment - 09/07/21 1616     Subjective Pt reports she saw a different MD from spine and scoliosis specialists in [place]. Pt reports she had new imaging and they found either screws were loose or there was an infection present. Pt reports she is meeting with surgeon in [place] 1 and will consider new surgical procedure at this time. She reports over the last few weeks as shehas been trying to cook she notes she is able to stand for 3-4 minutes prior to  her back becomming too painful to remain standing.    Pertinent History 51 year old female with history of HTN, migraines, morbid obesity--BMI-41, chronic hypotension, multiple back surgeries with chronic pain with LLE lumbar radiculopathy who was scheduled to have spinal cord stimulator placed by Dr. Davy Pique on 08/11/2020 but unable to perform surgery due to scar tissue.  History taken from patient and chart review.  Post op on awakening, she had excruciating pain RLE with plegia and and allodynia. She was admitted for work up by Dr. Ronnald Ramp from surgical center on 08/11/2020. Thoracic MRI done revealing subtle increased T2/STIR intensity distal cord at T12-L1 level suspicious for edema/acute spinal cord  injury, no CSF leak as well as prior PLIF L2-L5 with residual foraminal protrusion L3/5 potentially irritating right L3 nerve. She was started on IV Dilaudid, gabapentin as well as IV Decadron for management of pain, headaches and severe neuropathy.  She continues to have pain in RLE but is having some motor return, has constant headache, decreased hearing in right> left ear, constipation with nausea as well as epigastric pain and weakness. Therapy ongoing and CIR recommended due to functional decline.  Of note, she reports 50 lbs weigh loss in the past 3-4 months--due to issues with N/V/intake. Has had two falls last month---no recall of incident leading to fall question due to syncope. Was in process of work up for renal disease. She has had issue with LLE weakness with pain for the past year and being followed by Dr. Ernestina Patches. Did well with stimulator trial.    How long can you sit comfortably? 1 hour (no recent change)    How long can you stand comfortably? 3-4 min    How long can you walk comfortably? 1/2 block at maximum    Patient Stated Goals Improve strength and limit pain    Currently in Pain? Yes    Pain Score 7     Pain Location Back    Pain Orientation Lower    Pain Descriptors / Indicators Aching;Sore;Constant    Pain Onset 1 to 4 weeks ago                [facility] PT Assessment - 09/07/21 0001       Assessment   Medical Diagnosis Low back pain with radiculopathy and balance problems    Hand Dominance Right    Next MD Visit 10/05/21    Prior Therapy Yes      Precautions   Precautions Fall      Restrictions   Weight Bearing Restrictions No      [employer] residence    Type of [place]      Prior Function   Level of Independence Independent    Vocation Part time employment    Vocation Requirements [place], works from home      Observation/Other Assessments   Focus on Therapeutic Outcomes (FOTO)  45      Standardized Balance  Assessment   Standardized Balance Assessment Five Times Sit to Stand    Five times sit to stand comments  36.86 with B UE use    10 Meter Walk .41      Timed Up and Go Test   Normal TUG (seconds) 22.99                        Objective measurements completed on examination: See above findings.  PT Short Term Goals - 09/07/21 1717       PT SHORT TERM GOAL #1   Title Pt will be independent with HEP in order to improve strength and balance in order to decrease fall risk and improve function at home and work.    Baseline Pt not compliant due to holidays, etc    Time 3    Period Weeks    Status New    Target Date 09/28/21               PT Long Term Goals - 09/07/21 1719       PT LONG TERM GOAL #1   Title Patient will complete five times sit to stand test in < 20 seconds indicating an increased LE strength, power, and improved balance.    Baseline 36.86 sec 09/07/21    Time 12    Period Weeks    Status New    Target Date 11/30/21      PT LONG TERM GOAL #2   Title Pt will decrease TUG to below 14 seconds/decrease in order to demonstrate decreased fall risk.    Baseline TUG 22.99sec    Time 12    Period Weeks    Status New    Target Date 11/30/21      PT LONG TERM GOAL #3   Title Pt will decrease DHI score by at least 18 points in order to demonstrate clinically significant reduction in disability    Baseline 54 on 09/07/21   Not reassessed, but reports no significant change/improvement with dizziness   Time 12    Period Weeks    Status New      PT LONG TERM GOAL #4   Title Patient will increase 10 meter walk test to >1.26ms as to improve gait speed for better community ambulation and to reduce fall risk.    Baseline .41 on 09/07/21 with SPC    Time 12    Period Weeks    Status Partially Met      PT LONG TERM GOAL #5   Title Patient will increase FOTO score to equal to or greater than 57 to demonstrate statistically  significant improvement in mobility and quality of life.    Baseline 45 on 09/07/21    Time 12    Period Weeks    Status New      PT LONG TERM GOAL #6   Title Patient will increase six minute walk test distance to >1000 for progression to community ambulator and improve gait ability    Baseline Not assessed visit 1    Time 12    Period Weeks    Status New    Target Date 11/30/21      PT LONG TERM GOAL #7   Title Patient will ascend/descend 2- 3 steps with 1 rail and use of SPC with modified Independence in front of house to safely enter/exit home.    Baseline Pt required cues, increased time and CGA for safety on standard height steps at IE, unable to complete front steps at home at this time    Time 12    Period Weeks    Status New    Target Date 11/30/21                    Plan - 09/07/21 1649     Clinical Impression Statement Pt presents to therapy for evaluation. Pt was last seen for therpay at this clinic on 08/03/21 but was  discharged due to reaching her visit maximum. Since this visit pt has regressed with several of her functional goals including 10 meter walk test, 5 x sit to stand test and her TUG indicating decrease in function, strength and quality of life and increase in her risk of falls. Pt still has singificant difficulty with stair navigation and is unable to safely access the front of her house in order to check or mail. Pt also is unable to stand for longer than 3-4 minutes (regressed from 8 min) without significant pain and disocmfort. Pt wil benefit from skilled physical therapy intervention in order to increase her LE strength, function, ambulatory capacity, balance, reduce ehr risk of falls and increase her QOL.    Personal Factors and Comorbidities Comorbidity 3+;Time since onset of injury/illness/exacerbation    Comorbidities HYPOtension, paraparesis, SCI without spinal bone injury, DDD, cervical cancer, acute kidney injury, GERD, asthma, bradycardia, L  total hip replacement, hypokalemia    Examination-Activity Limitations Squat;Lift;Stairs;Bend;Stand;Reach Overhead;Carry;Transfers    Examination-Participation Restrictions Cleaning;Laundry;Meal Prep;Community Activity;Driving;Occupation    Stability/Clinical Decision Making Evolving/Moderate complexity    Clinical Decision Making Moderate    Rehab Potential Fair    PT Frequency Other (comment)   1x/every other week   PT Duration 12 weeks    PT Treatment/Interventions ADLs/Self Care Home Management;Aquatic Therapy;Gait training;Stair training;Functional mobility training;Therapeutic activities;Therapeutic exercise;Balance training;Moist Heat;Manual techniques;Patient/family education;Neuromuscular re-education;Passive range of motion;Energy conservation;Vestibular;Joint Manipulations;Spinal Manipulations    PT Next Visit Plan REview HEP, strenght and stair training as able    PT Home Exercise Plan Will review HEP from previous therapy on visit 2    Consulted and Agree with Plan of Care Patient             Patient will benefit from skilled therapeutic intervention in order to improve the following deficits and impairments:  Abnormal gait, Dizziness, Improper body mechanics, Decreased mobility, Cardiopulmonary status limiting activity, Decreased activity tolerance, Decreased endurance, Decreased range of motion, Decreased strength, Decreased balance, Decreased safety awareness, Difficulty walking, Impaired flexibility, Obesity  Visit Diagnosis: Chronic midline low back pain, unspecified whether sciatica present  Other abnormalities of gait and mobility  Unsteadiness on feet  Difficulty in walking, not elsewhere classified     Problem List Patient Active Problem List   Diagnosis Date Noted   Abnormality of gait 04/29/2021   Abnormal sensation in both ears 04/06/2021   Other adverse food reactions, not elsewhere classified, subsequent encounter 04/06/2021   Multiple drug allergies  04/06/2021   Insomnia due to medical condition 02/25/2021   Myofascial muscle pain 02/25/2021   Nerve pain 10/18/2020   Incomplete paraplegia ([place]) 10/18/2020   Orthostatic hypotension 10/18/2020   Chronic migraine without aura without status migrainosus, not intractable    Hypokalemia    Adjustment reaction with anxiety and depression    Spinal cord injury, lumbar, without spinal bone injury, sequela ([place]) 08/18/2020   Paraparesis ([place])    Post-operative pain    Hypotension    Slow transit constipation    AKI (acute kidney injury) ([place])    Right leg weakness 08/11/2020   DDD (degenerative disc disease), lumbar 09/02/2019    Class: Chronic   Degenerative disc disease, lumbar 09/02/2019   Chest tightness 09/23/2018   Bradycardia 09/23/2018   Labile blood pressure 03/08/2018   Chronic low back pain 09/03/2017   History of total hip replacement, left 01/26/2016   EDEMA 04/28/2008   LEG PAIN, BILATERAL 12/16/2007   CERVICAL CANCER 07/02/2007   MORBID OBESITY 07/02/2007   DEPRESSION  07/02/2007   COMMON MIGRAINE 07/02/2007   Other allergic rhinitis 07/02/2007   ASTHMA 07/02/2007   GERD 07/02/2007   ELEVATED BLOOD PRESSURE WITHOUT DIAGNOSIS OF HYPERTENSION 07/02/2007    Particia Lather, PT 09/07/2021, 5:32 PM  [place] MAIN [facility] SERVICES [address] [place], [place], [address] Phone: [phone]   Fax:  [phone]  Name: HOORIA GASPARINI MRN: [id number] Date of Birth: 1971/02/26

## 2021-09-08 NOTE — Addendum Note (Signed)
Addended by: Rivka Barbara B on: 09/08/2021 07:50 AM   Modules accepted: Orders

## 2021-09-12 ENCOUNTER — Ambulatory Visit: Payer: 59 | Admitting: Student

## 2021-09-12 ENCOUNTER — Ambulatory Visit: Payer: 59

## 2021-09-12 ENCOUNTER — Other Ambulatory Visit: Payer: Self-pay

## 2021-09-12 ENCOUNTER — Encounter: Payer: Self-pay | Admitting: Student

## 2021-09-12 ENCOUNTER — Inpatient Hospital Stay: Payer: 59

## 2021-09-12 VITALS — BP 119/48 | HR 68 | Temp 98.0°F | Resp 16 | Ht 64.0 in | Wt 264.0 lb

## 2021-09-12 DIAGNOSIS — M545 Low back pain, unspecified: Secondary | ICD-10-CM

## 2021-09-12 DIAGNOSIS — G8929 Other chronic pain: Secondary | ICD-10-CM

## 2021-09-12 DIAGNOSIS — G903 Multi-system degeneration of the autonomic nervous system: Secondary | ICD-10-CM

## 2021-09-12 DIAGNOSIS — R2681 Unsteadiness on feet: Secondary | ICD-10-CM

## 2021-09-12 DIAGNOSIS — R072 Precordial pain: Secondary | ICD-10-CM

## 2021-09-12 DIAGNOSIS — R2689 Other abnormalities of gait and mobility: Secondary | ICD-10-CM

## 2021-09-12 DIAGNOSIS — R002 Palpitations: Secondary | ICD-10-CM

## 2021-09-12 NOTE — Progress Notes (Signed)
Primary Physician/Referring:  [place] Pink, MD  Patient ID: Vanessa Medina, female    DOB: Aug 03, 1971, 51 y.o.   MRN: [id number]  Chief Complaint  Patient presents with   Hypotension   Follow-up    6 month    HPI:    Vanessa Medina  is a 51 y.o. with family history of premature CAD (mother's first MI in her 64s), chronic dizziness, morbid obesity, history of gastric sleeve procedure in 2015 and revision in 2019. Previously seen by Dr. Ida Rogue, and originally referred to our office for evaluation of postural orthostatic tachycardia syndrome, however review of her record shows patient does not have POTS.  Rather patient only has tachycardia component of POTS.    Patient symptoms of orthostasis started approximately 5 years ago and may be related to underlying nutritional deficiency following bariatric procedure, polypharmacy as well.   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    Patient presents for 29-month office visit.  At last visit she was stable from a cardiovascular standpoint.  Therefore no changes were made.  Patient's primary concern today is development of palpitations and left-sided chest pain over the last few months.  She reports 3-4 times per month she will have an episode of palpitations lasting several seconds without identifiable triggers or other associated symptoms.  She states she feels like her heart "jumps out of her chest".  She is also having intermittent left-sided chest pain both at rest and with exertion.  She states this pain has occurred 5 times over the last few months and lasts all day when she has it.  Denies dyspnea, syncope, near syncope, orthopnea, PND.  Regard to patient's orthostasis, she reports symptoms are  well controlled and blood pressure and heart rate have both remained stable since last office visit.  Past Medical History:  Diagnosis Date   Anemia    Cervical cancer ([place])    Family history of adverse reaction to anesthesia     " my mother takes a long time time wake up"   GERD (gastroesophageal reflux disease)    Headache    migraine   History of dysplastic nevus 01/18/2018   right shoulder, recurrent dysplastic nevus   History of shingles    Hypertension    Migraine    Multiple allergies    Obesity    Osteoarthritis    left hip   PONV (postoperative nausea and vomiting)    Sleep apnea    does not wear CPAP   Stroke ([place]) 08/11/2020   Wears glasses    Past Surgical History:  Procedure Laterality Date   ABDOMINAL HYSTERECTOMY     APPENDECTOMY     BACK SURGERY     BILIOPANCREATIC DIVERSION     with duodenal switch laparoscopic    CHOLECYSTECTOMY     COLONOSCOPY N/A 06/27/2021   Procedure: COLONOSCOPY;  Surgeon: Annamaria Helling, DO;  Location: [facility] ENDOSCOPY;  Service: Gastroenterology;  Laterality: N/A;   DIAGNOSTIC LAPAROSCOPY     LOA   ESOPHAGOGASTRODUODENOSCOPY N/A 06/27/2021   Procedure: ESOPHAGOGASTRODUODENOSCOPY (EGD);  Surgeon: Annamaria Helling, DO;  Location: [facility] ENDOSCOPY;  Service: Gastroenterology;  Laterality: N/A;   ESOPHAGOGASTRODUODENOSCOPY (EGD) WITH PROPOFOL N/A 07/25/2017   Procedure: ESOPHAGOGASTRODUODENOSCOPY (EGD) WITH PROPOFOL;  Surgeon: Vira Agar,  Gavin Pound, MD;  Location: [facility] ENDOSCOPY;  Service: Endoscopy;  Laterality: N/A;   KNEE ARTHROSCOPY     LAPAROSCOPIC GASTRIC SLEEVE RESECTION WITH HIATAL HERNIA REPAIR     TONSILLECTOMY     TOTAL HIP ARTHROPLASTY Left 01/26/2016   Procedure: LEFT TOTAL HIP ARTHROPLASTY ANTERIOR APPROACH;  Surgeon: Leandrew Koyanagi, MD;  Location: [place];  Service: Orthopedics;  Laterality: Left;   TRANSFORAMINAL LUMBAR INTERBODY FUSION (TLIF) WITH PEDICLE SCREW FIXATION 3 LEVEL  08/2019   Basil Dess, MD L2-L5    Family History  Problem Relation Age of Onset   Renal Disease Mother    Hypertension Mother    Sudden Cardiac Death Mother    Heart failure Mother    Valvular heart disease Mother    Heart disease Mother    Stroke Brother    Heart attack Brother 57   Breast cancer Cousin        paternal side   Diabetes Other     Social History   Tobacco Use   Smoking status: Never   Smokeless tobacco: Never  Substance Use Topics   Alcohol use: Yes    Comment: occasional wine   Marital Status: Divorced   ROS  Review of Systems  Cardiovascular:  Positive for chest pain and palpitations. Negative for claudication, dyspnea on exertion, leg swelling, near-syncope, orthopnea, paroxysmal nocturnal dyspnea and syncope.  Respiratory:  Negative for shortness of breath.   Hematologic/Lymphatic: Does not bruise/bleed easily.  Musculoskeletal:  Positive for arthritis, back pain and muscle weakness.  Gastrointestinal:  Negative for melena.  Neurological:  Positive for dizziness (vertigo). Negative for weakness.  Objective  Blood pressure (!) 119/48, pulse 68, temperature 98 F (36.7 C), resp. rate 16, height 5\' 4"  (1.626 m), weight 264 lb (119.7 kg), SpO2 99 %. Body mass index is 45.32 kg/m.   Vitals with BMI 09/12/2021 07/27/2021 07/04/2021  Height 5\' 4"  5\' 4"  5\' 4"   Weight 264 lbs 265 lbs 256 lbs  BMI 45.29 38.18 29.93  Systolic 716 967 893  Diastolic 48 70 70  Pulse 68 97 72   Orthostatic VS for the past 72 hrs (Last 3 readings):  Patient Position BP Location Cuff Size  09/12/21 1306 Sitting Left Arm Large    Physical Exam Vitals reviewed.  Constitutional:      Comments: Morbidly obese in no acute distress. Ambulating with cane   Cardiovascular:     Rate and Rhythm: Normal rate and regular rhythm.     Pulses: Intact distal pulses.          Carotid pulses are 2+ on the right side and 2+ on the left side.      Dorsalis pedis pulses are 2+ on the right side and 2+ on the left side.        Posterior tibial pulses are 2+ on the right side and 2+ on the left side.     Heart sounds: Normal heart sounds, S1 normal and S2 normal. No murmur heard.   No gallop.     Comments: Femoral and popliteal pulse difficult to feel due to patient's body habitus.  JVD difficult to see due to short neck. Pulmonary:     Effort: Pulmonary effort is normal. No respiratory distress.     Breath sounds: Normal breath sounds. No wheezing, rhonchi or rales.  Abdominal:     Comments: Obese. Pannus present  Musculoskeletal:     Right lower leg: No edema.     Left lower leg: No  edema.  Neurological:     Mental Status: She is alert.  Physical exam is unchanged compared to previous office visit.  Laboratory examination:   No results for input(s): NA, K, CL, CO2, GLUCOSE, BUN, CREATININE, CALCIUM, GFRNONAA, GFRAA in the last 8760 hours.  CrCl cannot be calculated (Patient's most recent lab result is older than the maximum 21 days allowed.).  CMP Latest Ref Rng & Units 08/30/2020 08/26/2020 08/24/2020  Glucose 70 - 99 mg/dL 92 92 83  BUN 6 - 20 mg/dL 13 18 23(H)  Creatinine 0.44 - 1.00 mg/dL 0.95 0.99 0.98  Sodium 135 - 145 mmol/L 139 139 139  Potassium 3.5 - 5.1 mmol/L 3.6 3.7 3.9  Chloride 98 - 111 mmol/L 109 107 109  CO2 22 - 32 mmol/L 20(L) 22 20(L)  Calcium 8.9 - 10.3 mg/dL 8.8(L) 8.9 9.3  Total Protein 6.5 - 8.1 g/dL - - -  Total Bilirubin 0.3 - 1.2 mg/dL - - -  Alkaline Phos 38 - 126 U/L - - -  AST 15 - 41 U/L - - -  ALT 0 - 44 U/L - - -   CBC Latest Ref Rng & Units 08/30/2020 08/23/2020 08/19/2020  WBC 4.0 - 10.5 K/uL 4.4 7.4 8.6  Hemoglobin 12.0 - 15.0 g/dL 9.3(L) 10.3(L) 10.8(L)  Hematocrit 36.0 - 46.0 % 28.0(L) 32.1(L) 32.8(L)  Platelets 150 - 400 K/uL 202 170 164    Lipid Panel No results for input(s): CHOL, TRIG, LDLCALC, VLDL, HDL, CHOLHDL, LDLDIRECT in the last 8760 hours.  HEMOGLOBIN A1C Lab Results  Component Value Date   HGBA1C 5.1 06/24/2013   TSH No results for  input(s): TSH in the last 8760 hours.   External labs:  None  Allergies   Allergies  Allergen Reactions   Shellfish Allergy Anaphylaxis   Meperidine Hcl Other (See Comments)    BRADYCARDIA   Morphine Other (See Comments)    BRADYCARDIA   Acetaminophen Itching   Bee Pollen Other (See Comments)    Unknown   Ivp Dye [Iodinated Contrast Media] Other (See Comments)    Swelling    Oxycodone Hives and Itching    Blisters on back   Pentazocine Nausea Only and Nausea And Vomiting   Codeine Nausea And Vomiting   Oxycodone-Acetaminophen Itching   Pentazocine Lactate Nausea And Vomiting    REACTION: vomiting with [place] NX   Propoxyphene Nausea Only and Nausea And Vomiting   Propoxyphene N-Acetaminophen Nausea And Vomiting    Medications Prior to Visit:   Outpatient Medications Prior to Visit  Medication Sig Dispense Refill   aspirin EC 81 MG tablet Take 81 mg by mouth daily. Swallow whole.     azelastine (ASTELIN) 0.1 % nasal spray Place into the nose.     baclofen (LIORESAL) 10 MG tablet Take 1 tablet (10 mg total) by mouth 3 (three) times daily. IF NEEDED- for spasms 270 tablet 3   diclofenac Sodium (VOLTAREN) 1 % GEL Apply 4 g topically 4 (four) times daily. 350 g 3   EPINEPHrine 0.3 mg/0.3 mL IJ SOAJ injection SMARTSIG:0.3 Milliliter(s) IM Once PRN     fludrocortisone (FLORINEF) 0.1 MG tablet Take 0.1 mg by mouth 2 (two) times daily.     fluticasone (FLONASE) 50 MCG/ACT nasal spray Place 1-2 sprays into both nostrils daily.     gabapentin (NEURONTIN) 400 MG capsule TAKE 3 CAPSULES(1200 MG) BY MOUTH AT BEDTIME 90 capsule 5   methocarbamol (ROBAXIN) 500 MG tablet TAKE 1 TABLET BY MOUTH  EVERY  12 HOURS AS NEEDED  FOR MUSCLE SPASMS 30 tablet 0   midodrine (PROAMATINE) 10 MG tablet Take 1 tablet (10 mg total) by mouth 3 (three) times daily. While awake. Can take one additional if dizziness 270 tablet 3   montelukast (SINGULAIR) 10 MG tablet Take 1 tablet (10 mg total) by mouth at  bedtime. For allergies 90 tablet 3   Multiple Vitamin (MULTIVITAMIN WITH MINERALS) TABS tablet Take 1 tablet by mouth 2 (two) times daily.     MYRBETRIQ 25 MG TB24 tablet Take 25 mg by mouth daily.     polyethylene glycol-electrolytes (NULYTELY) 420 g solution Take by mouth.     pramipexole (MIRAPEX) 0.125 MG tablet Take 0.5 mg by mouth at bedtime.     pyridOXINE (VITAMIN B-6) 100 MG tablet Take 1 tablet (100 mg total) by mouth daily. 90 tablet 1   Thiamine HCl (VITAMIN B-1) 250 MG tablet Take 1 tablet (250 mg total) by mouth daily. 90 tablet 1   topiramate (TOPAMAX) 100 MG tablet TAKE 1 TABLET(100 MG) BY MOUTH AT BEDTIME 90 tablet 3   TRULICITY 1.5 TX/6.4WO SOPN Inject 1.5 mg into the skin once a week.     vitamin B-12 (CYANOCOBALAMIN) 1000 MCG tablet Take 1 tablet (1,000 mcg total) by mouth daily. 90 tablet 1   DULoxetine (CYMBALTA) 30 MG capsule Take 1 capsule by mouth at bedtime for 1 week then 2 capsules at bedtime thereafter 180 capsule 0   DULoxetine (CYMBALTA) 60 MG capsule TAKE 1 CAPSULE BY MOUTH  NIGHTLY 90 capsule 3   gabapentin (NEURONTIN) 300 MG capsule Take 300 mg by mouth daily.     No facility-administered medications prior to visit.   Final Medications at End of Visit    Current Meds  Medication Sig   aspirin EC 81 MG tablet Take 81 mg by mouth daily. Swallow whole.   azelastine (ASTELIN) 0.1 % nasal spray Place into the nose.   baclofen (LIORESAL) 10 MG tablet Take 1 tablet (10 mg total) by mouth 3 (three) times daily. IF NEEDED- for spasms   diclofenac Sodium (VOLTAREN) 1 % GEL Apply 4 g topically 4 (four) times daily.   EPINEPHrine 0.3 mg/0.3 mL IJ SOAJ injection SMARTSIG:0.3 Milliliter(s) IM Once PRN   fludrocortisone (FLORINEF) 0.1 MG tablet Take 0.1 mg by mouth 2 (two) times daily.   fluticasone (FLONASE) 50 MCG/ACT nasal spray Place 1-2 sprays into both nostrils daily.   gabapentin (NEURONTIN) 400 MG capsule TAKE 3 CAPSULES(1200 MG) BY MOUTH AT BEDTIME    methocarbamol (ROBAXIN) 500 MG tablet TAKE 1 TABLET BY MOUTH  EVERY 12 HOURS AS NEEDED  FOR MUSCLE SPASMS   midodrine (PROAMATINE) 10 MG tablet Take 1 tablet (10 mg total) by mouth 3 (three) times daily. While awake. Can take one additional if dizziness   montelukast (SINGULAIR) 10 MG tablet Take 1 tablet (10 mg total) by mouth at bedtime. For allergies   Multiple Vitamin (MULTIVITAMIN WITH MINERALS) TABS tablet Take 1 tablet by mouth 2 (two) times daily.   MYRBETRIQ 25 MG TB24 tablet Take 25 mg by mouth daily.   polyethylene glycol-electrolytes (NULYTELY) 420 g solution Take by mouth.   pramipexole (MIRAPEX) 0.125 MG tablet Take 0.5 mg by mouth at bedtime.   pyridOXINE (VITAMIN B-6) 100 MG tablet Take 1 tablet (100 mg total) by mouth daily.   Thiamine HCl (VITAMIN B-1) 250 MG tablet Take 1 tablet (250 mg total) by mouth daily.   topiramate (TOPAMAX) 100 MG tablet TAKE  1 TABLET(100 MG) BY MOUTH AT BEDTIME   TRULICITY 1.5 MH/9.6QI SOPN Inject 1.5 mg into the skin once a week.   vitamin B-12 (CYANOCOBALAMIN) 1000 MCG tablet Take 1 tablet (1,000 mcg total) by mouth daily.   Radiology:   No results found.  Cardiac Studies:   Echocardiogram 08/14/2018: - Left ventricle: The cavity size was normal. Wall thickness was    increased in a pattern of mild LVH. Systolic function was normal.   The estimated ejection fraction was in the range of 55% to 60%.   Wall motion was normal; there were no regional wall motion   abnormalities. Left ventricular diastolic function parameters   were normal. - Right ventricle: The cavity size was normal. Wall thickness was   normal. Systolic function was normal.  EKG:  09/12/2021: Sinus rhythm at a rate of 57 bpm.  Normal axis.  No evidence of ischemia or underlying injury pattern.  Compared EKG 03/10/2021, no significant change.  Assessment     ICD-10-CM   1. Neurogenic orthostatic hypotension (HCC)  G90.3 EKG 12-Lead    2. Palpitations  R00.2 LONG TERM  MONITOR (3-14 DAYS)    [facility] ECHOCARDIOGRAM COMPLETE    3. Precordial pain  R07.2 CT CARDIAC SCORING ([facility] LOCATIONS ONLY)    [facility] ECHOCARDIOGRAM COMPLETE       Medications Discontinued During This Encounter  Medication Reason   DULoxetine (CYMBALTA) 30 MG capsule    DULoxetine (CYMBALTA) 60 MG capsule    gabapentin (NEURONTIN) 300 MG capsule      No orders of the defined types were placed in this encounter.   Orders Placed This Encounter  Procedures   CT CARDIAC SCORING ([facility] LOCATIONS ONLY)    Standing Status:   Future    Standing Expiration Date:   09/12/2022    Order Specific Question:   Preferred imaging location?    Answer:   [facility]    Order Specific Question:   Is patient pregnant?    Answer:   No   LONG TERM MONITOR (3-14 DAYS)    Standing Status:   Future    Standing Expiration Date:   09/12/2022    Order Specific Question:   Where should this test be performed?    Answer:   [facility]    Order Specific Question:   Does the patient have an implanted cardiac device?    Answer:   No    Order Specific Question:   Prescribed days of wear    Answer:   14   EKG 12-Lead   [facility] ECHOCARDIOGRAM COMPLETE    Standing Status:   Future    Standing Expiration Date:   09/12/2022     Recommendations:   Vanessa Medina is a 51 y.o. with chronic dizziness, morbid obesity, history of gastric sleeve procedure in 2015 and revision in 2019. Previously seen by Dr. Ida Rogue, and originally referred to our office for evaluation of postural orthostatic tachycardia syndrome, however review of her record shows patient does not have POTS.  Rather patient only has tachycardia component of POTS.    Patient symptoms of orthostasis started approximately 5 years ago and may be related to underlying nutritional deficiency following bariatric procedure, polypharmacy as well.   She had a complex hospital stay in December 2021 when she was admitted for elective spinal stimulator placement, had  complications leading to right leg paresthesia and weakness and hence had to be admitted to the hospital with what appears to be a spinal stroke  from procedure.    Patient presents for 35-month office visit.  At last visit she was stable from a cardiovascular standpoint.  Therefore no changes were made.  Patient's orthostatic symptoms are currently stable with midodrine 10 mg 3 times daily and Florinef 0.2 mg daily.  Will not make changes to these medications.  However given patient's palpitations will obtain 2-week cardiac monitor to rule out underlying cardiac arrhythmias.  Also given family history of premature CAD and patient's new onset of chest pain symptoms, although atypical, recommend further evaluation.  We will obtain coronary calcium score as well as echocardiogram.  Further recommendations pending results of cardiac testing, for now scheduled for 63-month office follow-up.   Alethia Berthold, PA-C 09/12/2021, 1:56 PM Office: [phone]

## 2021-09-12 NOTE — Therapy (Signed)
[place] MAIN [facility] [address] [place], [place], [address] Phone: [phone]   Fax:  [phone]  Physical Therapy Treatment  Patient Details  Name: Vanessa Medina MRN: [id number] Date of Birth: 1971/07/26 No data recorded  Encounter Date: 09/12/2021   PT End of Session - 09/12/21 1701     Visit Number 2    Number of Visits 24    Date for PT Re-Evaluation 11/30/21    Authorization Type U[employer] Other    Authorization Time Period 01/24/21-04/18/2021    PT Start Time 1600    PT Stop Time 1647    PT Time Calculation (min) 47 min    Equipment Utilized During Treatment Gait belt    Activity Tolerance Patient tolerated treatment well;Patient limited by pain    Behavior During Therapy WFL for tasks assessed/performed             Past Medical History:  Diagnosis Date   Anemia    Cervical cancer ([place])    Family history of adverse reaction to anesthesia     " my mother takes a long time time wake up"   GERD (gastroesophageal reflux disease)    Headache    migraine   History of dysplastic nevus 01/18/2018   right shoulder, recurrent dysplastic nevus   History of shingles    Hypertension    Migraine    Multiple allergies    Obesity    Osteoarthritis    left hip   PONV (postoperative nausea and vomiting)    Sleep apnea    does not wear CPAP   Stroke ([place]) 08/11/2020   Wears glasses     Past Surgical History:  Procedure Laterality Date   ABDOMINAL HYSTERECTOMY     APPENDECTOMY     BACK SURGERY     BILIOPANCREATIC DIVERSION     with duodenal switch laparoscopic    CHOLECYSTECTOMY     COLONOSCOPY N/A 06/27/2021   Procedure: COLONOSCOPY;  Surgeon: Annamaria Helling, DO;  Location: [facility] ENDOSCOPY;  Service: Gastroenterology;  Laterality: N/A;   DIAGNOSTIC LAPAROSCOPY     LOA   ESOPHAGOGASTRODUODENOSCOPY N/A 06/27/2021   Procedure: ESOPHAGOGASTRODUODENOSCOPY (EGD);  Surgeon: Annamaria Helling, DO;  Location:  [facility] ENDOSCOPY;  Service: Gastroenterology;  Laterality: N/A;   ESOPHAGOGASTRODUODENOSCOPY (EGD) WITH PROPOFOL N/A 07/25/2017   Procedure: ESOPHAGOGASTRODUODENOSCOPY (EGD) WITH PROPOFOL;  Surgeon: Manya Silvas, MD;  Location: [facility] ENDOSCOPY;  Service: Endoscopy;  Laterality: N/A;   KNEE ARTHROSCOPY     LAPAROSCOPIC GASTRIC SLEEVE RESECTION WITH HIATAL HERNIA REPAIR     TONSILLECTOMY     TOTAL HIP ARTHROPLASTY Left 01/26/2016   Procedure: LEFT TOTAL HIP ARTHROPLASTY ANTERIOR APPROACH;  Surgeon: Leandrew Koyanagi, MD;  Location: [place];  Service: Orthopedics;  Laterality: Left;   TRANSFORAMINAL LUMBAR INTERBODY FUSION (TLIF) WITH PEDICLE SCREW FIXATION 3 LEVEL  08/2019   Basil Dess, MD L2-L5    There were no vitals filed for this visit.   Subjective Assessment - 09/12/21 1659     Subjective Patient is waiting for her appointment on the 1st to determine when she will have the surgery. No falls or LOB since last session.    Pertinent History 51 year old female with history of HTN, migraines, morbid obesity--BMI-41, chronic hypotension, multiple back surgeries with chronic pain with LLE lumbar radiculopathy who was scheduled to have spinal cord stimulator placed by Dr. Davy Pique on 08/11/2020 but unable to perform surgery due to scar tissue.  History taken  from patient and chart review.  Post op on awakening, she had excruciating pain RLE with plegia and and allodynia. She was admitted for work up by Dr. Ronnald Ramp from surgical center on 08/11/2020. Thoracic MRI done revealing subtle increased T2/STIR intensity distal cord at T12-L1 level suspicious for edema/acute spinal cord injury, no CSF leak as well as prior PLIF L2-L5 with residual foraminal protrusion L3/5 potentially irritating right L3 nerve. She was started on IV Dilaudid, gabapentin as well as IV Decadron for management of pain, headaches and severe neuropathy.  She continues to have pain in RLE but is having some motor return, has constant headache,  decreased hearing in right> left ear, constipation with nausea as well as epigastric pain and weakness. Therapy ongoing and CIR recommended due to functional decline.  Of note, she reports 50 lbs weigh loss in the past 3-4 months--due to issues with N/V/intake. Has had two falls last month---no recall of incident leading to fall question due to syncope. Was in process of work up for renal disease. She has had issue with LLE weakness with pain for the past year and being followed by Dr. Ernestina Patches. Did well with stimulator trial.    How long can you sit comfortably? 1 hour (no recent change)    How long can you stand comfortably? 3-4 min    How long can you walk comfortably? 1/2 block at maximum    Patient Stated Goals Improve strength and limit pain    Currently in Pain? Yes    Pain Score 7     Pain Location Back    Pain Orientation Lower    Pain Descriptors / Indicators Aching;Sore    Pain Type Acute pain;Chronic pain    Pain Onset 1 to 4 weeks ago    Pain Frequency Constant                Treatment:  Log roll supine<>sit with min A; episodic dizziness with transitions requiring stabilizations  Supine TrA with abduction/adduction hooklying control 10x each LE; inhale as abduct, exhale and tighten core while adducting back to midline, single LE at a time Hooklying GTB abduction 15x  Car transfer requiring min A for set up.    Access Code: [email or web address] URL: [email or web address] Date: 09/12/2021 Prepared by: Janna Arch  Exercises  Seated Long Arc Quad - 1 x daily - 7 x weekly - 3 sets - 10 reps Seated Hip Abduction with Resistance - 1 x daily - 7 x weekly - 3 sets - 10 reps Seated Isometric Hip Adduction with Pelvic Floor Contraction - 1 x daily - 7 x weekly - 3 sets - 10 reps Seated Heel Raise - 1 x daily - 7 x weekly - 3 sets - 10 reps Seated Toe Raise - 1 x daily - 7 x weekly - 3 sets - 10 reps Sit to Stand with Armchair - 1 x daily - 7 x weekly - 2 sets - 10  reps - 5 hold Supine Transversus Abdominis Bracing - Hands on Stomach - 1 x daily - 7 x weekly - 2 sets - 10 reps - 5 hold Supine March with Posterior Pelvic Tilt - 1 x daily - 7 x weekly - 2 sets - 10 reps - 5 hold-deferred due to pain      Pt educated throughout session about proper posture and technique with exercises. Improved exercise technique, movement at target joints, use of target muscles after min to mod verbal, visual, tactile cues.   Patient  is educated on and performs HEP demonstrating understanding. She is unable to perform supine march with posterior pelvic tilt due to pain and this was deferred for future sessions. Patient had one episode of extreme dizziness with transitioning from supine to sit, but this alleviated with breathing training and stabilization. Pt will benefit from skilled physical therapy intervention in order to increase her LE strength, function, ambulatory capacity, balance, reduce her risk of falls and increase her QOL.            PT Education - 09/12/21 1700     Education Details exercise technique, body mechanics, HEP    Person(s) Educated Patient    Methods Explanation;Demonstration;Tactile cues;Verbal cues;Handout    Comprehension Verbalized understanding;Returned demonstration;Verbal cues required;Tactile cues required              PT Short Term Goals - 09/07/21 1717       PT SHORT TERM GOAL #1   Title Pt will be independent with HEP in order to improve strength and balance in order to decrease fall risk and improve function at home and work.    Baseline Pt not compliant due to holidays, etc    Time 3    Period Weeks    Status New    Target Date 09/28/21               PT Long Term Goals - 09/07/21 1719       PT LONG TERM GOAL #1   Title Patient will complete five times sit to stand test in < 20 seconds indicating an increased LE strength, power, and improved balance.    Baseline 36.86 sec 09/07/21    Time 12    Period  Weeks    Status New    Target Date 11/30/21      PT LONG TERM GOAL #2   Title Pt will decrease TUG to below 14 seconds/decrease in order to demonstrate decreased fall risk.    Baseline TUG 22.99sec    Time 12    Period Weeks    Status New    Target Date 11/30/21      PT LONG TERM GOAL #3   Title Pt will decrease DHI score by at least 18 points in order to demonstrate clinically significant reduction in disability    Baseline 54 on 09/07/21   Not reassessed, but reports no significant change/improvement with dizziness   Time 12    Period Weeks    Status New      PT LONG TERM GOAL #4   Title Patient will increase 10 meter walk test to >1.54ms as to improve gait speed for better community ambulation and to reduce fall risk.    Baseline .41 on 09/07/21 with SPC    Time 12    Period Weeks    Status Partially Met      PT LONG TERM GOAL #5   Title Patient will increase FOTO score to equal to or greater than 57 to demonstrate statistically significant improvement in mobility and quality of life.    Baseline 45 on 09/07/21    Time 12    Period Weeks    Status New      PT LONG TERM GOAL #6   Title Patient will increase six minute walk test distance to >1000 for progression to community ambulator and improve gait ability    Baseline Not assessed visit 1    Time 12    Period Weeks    Status New  Target Date 11/30/21      PT LONG TERM GOAL #7   Title Patient will ascend/descend 2- 3 steps with 1 rail and use of SPC with modified [place] in front of house to safely enter/exit home.    Baseline Pt required cues, increased time and CGA for safety on standard height steps at IE, unable to complete front steps at home at this time    Time 12    Period Weeks    Status New    Target Date 11/30/21                   Plan - 09/12/21 1706     Clinical Impression Statement Patient is educated on and performs HEP demonstrating understanding. She is unable to perform supine march  with posterior pelvic tilt due to pain and this was deferred for future sessions. Patient had one episode of extreme dizziness with transitioning from supine to sit, but this alleviated with breathing training and stabilization. Pt will benefit from skilled physical therapy intervention in order to increase her LE strength, function, ambulatory capacity, balance, reduce her risk of falls and increase her QOL    Personal Factors and Comorbidities Comorbidity 3+;Time since onset of injury/illness/exacerbation    Comorbidities HYPOtension, paraparesis, SCI without spinal bone injury, DDD, cervical cancer, acute kidney injury, GERD, asthma, bradycardia, L total hip replacement, hypokalemia    Examination-Activity Limitations Squat;Lift;Stairs;Bend;Stand;Reach Overhead;Carry;Transfers    Examination-Participation Restrictions Cleaning;Laundry;Meal Prep;Community Activity;Driving;Occupation    Stability/Clinical Decision Making Evolving/Moderate complexity    Rehab Potential Fair    PT Frequency Other (comment)   1x/every other week   PT Duration 12 weeks    PT Treatment/Interventions ADLs/Self Care Home Management;Aquatic Therapy;Gait training;Stair training;Functional mobility training;Therapeutic activities;Therapeutic exercise;Balance training;Moist Heat;Manual techniques;Patient/family education;Neuromuscular re-education;Passive range of motion;Energy conservation;Vestibular;Joint Manipulations;Spinal Manipulations    PT Next Visit Plan REview HEP, strenght and stair training as able    PT Home Exercise Plan Will review HEP from previous therapy on visit 2    Consulted and Agree with Plan of Care Patient             Patient will benefit from skilled therapeutic intervention in order to improve the following deficits and impairments:  Abnormal gait, Dizziness, Improper body mechanics, Decreased mobility, Cardiopulmonary status limiting activity, Decreased activity tolerance, Decreased endurance,  Decreased range of motion, Decreased strength, Decreased balance, Decreased safety awareness, Difficulty walking, Impaired flexibility, Obesity  Visit Diagnosis: Chronic midline low back pain, unspecified whether sciatica present  Other abnormalities of gait and mobility  Unsteadiness on feet     Problem List Patient Active Problem List   Diagnosis Date Noted   Abnormality of gait 04/29/2021   Abnormal sensation in both ears 04/06/2021   Other adverse food reactions, not elsewhere classified, subsequent encounter 04/06/2021   Multiple drug allergies 04/06/2021   Insomnia due to medical condition 02/25/2021   Myofascial muscle pain 02/25/2021   Nerve pain 10/18/2020   Incomplete paraplegia ([place]) 10/18/2020   Orthostatic hypotension 10/18/2020   Chronic migraine without aura without status migrainosus, not intractable    Hypokalemia    Adjustment reaction with anxiety and depression    Spinal cord injury, lumbar, without spinal bone injury, sequela ([place]) 08/18/2020   Paraparesis ([facility])    Post-operative pain    Hypotension    Slow transit constipation    AKI (acute kidney injury) ([place])    Right leg weakness 08/11/2020   DDD (degenerative disc disease), lumbar 09/02/2019    Class:  Chronic   Degenerative disc disease, lumbar 09/02/2019   Chest tightness 09/23/2018   Bradycardia 09/23/2018   Labile blood pressure 03/08/2018   Chronic low back pain 09/03/2017   History of total hip replacement, left 01/26/2016   EDEMA 04/28/2008   LEG PAIN, BILATERAL 12/16/2007   CERVICAL CANCER 07/02/2007   MORBID OBESITY 07/02/2007   DEPRESSION 07/02/2007   COMMON MIGRAINE 07/02/2007   Other allergic rhinitis 07/02/2007   ASTHMA 07/02/2007   GERD 07/02/2007   ELEVATED BLOOD PRESSURE WITHOUT DIAGNOSIS OF HYPERTENSION 07/02/2007   Janna Arch, PT, DPT  09/12/2021, 5:08 PM  [place] MAIN [facility] SERVICES [address] [place], [place],  [address] Phone: [phone]   Fax:  [phone]  Name: Vanessa Medina MRN: [id number] Date of Birth: August 31, 1971

## 2021-09-14 ENCOUNTER — Ambulatory Visit: Payer: 59

## 2021-09-14 ENCOUNTER — Other Ambulatory Visit: Payer: Self-pay

## 2021-09-14 DIAGNOSIS — R2681 Unsteadiness on feet: Secondary | ICD-10-CM

## 2021-09-14 DIAGNOSIS — R2689 Other abnormalities of gait and mobility: Secondary | ICD-10-CM

## 2021-09-14 DIAGNOSIS — M545 Low back pain, unspecified: Secondary | ICD-10-CM

## 2021-09-14 DIAGNOSIS — R278 Other lack of coordination: Secondary | ICD-10-CM

## 2021-09-14 DIAGNOSIS — M6281 Muscle weakness (generalized): Secondary | ICD-10-CM

## 2021-09-14 NOTE — Therapy (Signed)
[place] [facility] SERVICES [address] [place], [place], [address] Phone: [phone]   Fax:  [phone]  Physical Therapy Treatment  Patient Details  Name: Vanessa Medina MRN: [id number] Date of Birth: 1970-09-14 No data recorded  Encounter Date: 09/14/2021   PT End of Session - 09/15/21 1211     Visit Number 3    Number of Visits 24    Date for PT Re-Evaluation 11/30/21    Authorization Type [employer] Other    Authorization Time Period 01/24/21-04/18/2021    PT Start Time 1602    PT Stop Time 1652    PT Time Calculation (min) 50 min    Equipment Utilized During Treatment Gait belt    Activity Tolerance Patient tolerated treatment well;Patient limited by pain    Behavior During Therapy WFL for tasks assessed/performed             Past Medical History:  Diagnosis Date   Anemia    Cervical cancer ([place])    Family history of adverse reaction to anesthesia     " my mother takes a long time time wake up"   GERD (gastroesophageal reflux disease)    Headache    migraine   History of dysplastic nevus 01/18/2018   right shoulder, recurrent dysplastic nevus   History of shingles    Hypertension    Migraine    Multiple allergies    Obesity    Osteoarthritis    left hip   PONV (postoperative nausea and vomiting)    Sleep apnea    does not wear CPAP   Stroke ([place]) 08/11/2020   Wears glasses     Past Surgical History:  Procedure Laterality Date   ABDOMINAL HYSTERECTOMY     APPENDECTOMY     BACK SURGERY     BILIOPANCREATIC DIVERSION     with duodenal switch laparoscopic    CHOLECYSTECTOMY     COLONOSCOPY N/A 06/27/2021   Procedure: COLONOSCOPY;  Surgeon: Annamaria Helling, DO;  Location: [facility] ENDOSCOPY;  Service: Gastroenterology;  Laterality: N/A;   DIAGNOSTIC LAPAROSCOPY     LOA   ESOPHAGOGASTRODUODENOSCOPY N/A 06/27/2021   Procedure: ESOPHAGOGASTRODUODENOSCOPY (EGD);  Surgeon: Annamaria Helling, DO;  Location:  [facility] ENDOSCOPY;  Service: Gastroenterology;  Laterality: N/A;   ESOPHAGOGASTRODUODENOSCOPY (EGD) WITH PROPOFOL N/A 07/25/2017   Procedure: ESOPHAGOGASTRODUODENOSCOPY (EGD) WITH PROPOFOL;  Surgeon: Manya Silvas, MD;  Location: [facility] ENDOSCOPY;  Service: Endoscopy;  Laterality: N/A;   KNEE ARTHROSCOPY     LAPAROSCOPIC GASTRIC SLEEVE RESECTION WITH HIATAL HERNIA REPAIR     TONSILLECTOMY     TOTAL HIP ARTHROPLASTY Left 01/26/2016   Procedure: LEFT TOTAL HIP ARTHROPLASTY ANTERIOR APPROACH;  Surgeon: Leandrew Koyanagi, MD;  Location: [facility];  Service: Orthopedics;  Laterality: Left;   TRANSFORAMINAL LUMBAR INTERBODY FUSION (TLIF) WITH PEDICLE SCREW FIXATION 3 LEVEL  08/2019   Basil Dess, MD L2-L5    There were no vitals filed for this visit.   Subjective Assessment - 09/15/21 1209     Subjective Pt reports she has been performing her HEP. Pt reports at upcoming doctor's appointment they will try to determine if she has an infection in her back.    Pertinent History 51 year old female with history of HTN, migraines, morbid obesity--BMI-41, chronic hypotension, multiple back surgeries with chronic pain with LLE lumbar radiculopathy who was scheduled to have spinal cord stimulator placed by Dr. Davy Pique on 08/11/2020 but unable to perform surgery due to scar tissue.  History taken from patient and chart review.  Post op on awakening, she had excruciating pain RLE with plegia and and allodynia. She was admitted for work up by Dr. Ronnald Ramp from surgical center on 08/11/2020. Thoracic MRI done revealing subtle increased T2/STIR intensity distal cord at T12-L1 level suspicious for edema/acute spinal cord injury, no CSF leak as well as prior PLIF L2-L5 with residual foraminal protrusion L3/5 potentially irritating right L3 nerve. She was started on IV Dilaudid, gabapentin as well as IV Decadron for management of pain, headaches and severe neuropathy.  She continues to have pain in RLE but is having some motor return,  has constant headache, decreased hearing in right> left ear, constipation with nausea as well as epigastric pain and weakness. Therapy ongoing and CIR recommended due to functional decline.  Of note, she reports 50 lbs weigh loss in the past 3-4 months--due to issues with N/V/intake. Has had two falls last month---no recall of incident leading to fall question due to syncope. Was in process of work up for renal disease. She has had issue with LLE weakness with pain for the past year and being followed by Dr. Ernestina Patches. Did well with stimulator trial.    How long can you sit comfortably? 1 hour (no recent change)    How long can you stand comfortably? 3-4 min    How long can you walk comfortably? 1/2 block at maximum    Patient Stated Goals Improve strength and limit pain    Currently in Pain? Yes    Pain Location Back    Pain Orientation Lower    Pain Onset 1 to 4 weeks ago               INTERVENTIONS   Pt hook-lying on plinth with bolster under knees:   SAQ 3x10 each LE through pain-free range. PT provides assist for movement.  Assisted knee to chest march 3x10 each LE  Glute squeeze with TrA activation 10x with 2-3 sec holds  Glute bridge performed through reduced range of motion 10x  Nustep level 0. Cuing for SPM in 40s-50s x 7 minutes. Pt rates as hard. The pt was monitored throughout for response to intervention. Pt reports intervention is fatiguing. Cuing for slow dismount to monitor for dizziness/lightheadedness.   Seated adductor squeezes 2x10  Seated ankle rockers (DF/PF) 20x   Rest breaks taken throughout  Car transfer requiring min A for set up.    Pt educated throughout session about proper posture and technique with exercises. Improved exercise technique, movement at target joints, use of target muscles after min to mod verbal, visual, tactile cues.    PT Education - 09/15/21 1211     Education Details exercise technique, body mechanics    Person(s) Educated  Patient    Methods Explanation;Demonstration;Tactile cues;Verbal cues    Comprehension Verbalized understanding;Returned demonstration;Verbal cues required;Need further instruction              PT Short Term Goals - 09/07/21 1717       PT SHORT TERM GOAL #1   Title Pt will be independent with HEP in order to improve strength and balance in order to decrease fall risk and improve function at home and work.    Baseline Pt not compliant due to holidays, etc    Time 3    Period Weeks    Status New    Target Date 09/28/21               PT Long  Term Goals - 09/07/21 1719       PT LONG TERM GOAL #1   Title Patient will complete five times sit to stand test in < 20 seconds indicating an increased LE strength, power, and improved balance.    Baseline 36.86 sec 09/07/21    Time 12    Period Weeks    Status New    Target Date 11/30/21      PT LONG TERM GOAL #2   Title Pt will decrease TUG to below 14 seconds/decrease in order to demonstrate decreased fall risk.    Baseline TUG 22.99sec    Time 12    Period Weeks    Status New    Target Date 11/30/21      PT LONG TERM GOAL #3   Title Pt will decrease DHI score by at least 18 points in order to demonstrate clinically significant reduction in disability    Baseline 54 on 09/07/21   Not reassessed, but reports no significant change/improvement with dizziness   Time 12    Period Weeks    Status New      PT LONG TERM GOAL #4   Title Patient will increase 10 meter walk test to >1.78ms as to improve gait speed for better community ambulation and to reduce fall risk.    Baseline .41 on 09/07/21 with SPC    Time 12    Period Weeks    Status Partially Met      PT LONG TERM GOAL #5   Title Patient will increase FOTO score to equal to or greater than 57 to demonstrate statistically significant improvement in mobility and quality of life.    Baseline 45 on 09/07/21    Time 12    Period Weeks    Status New      PT LONG TERM GOAL #6    Title Patient will increase six minute walk test distance to >1000 for progression to community ambulator and improve gait ability    Baseline Not assessed visit 1    Time 12    Period Weeks    Status New    Target Date 11/30/21      PT LONG TERM GOAL #7   Title Patient will ascend/descend 2- 3 steps with 1 rail and use of SPC with modified [place] in front of house to safely enter/exit home.    Baseline Pt required cues, increased time and CGA for safety on standard height steps at IE, unable to complete front steps at home at this time    Time 12    Period Weeks    Status New    Target Date 11/30/21                   Plan - 09/15/21 1212     Clinical Impression Statement Pt was highly motivated to participate in session. However, interventions were limited due to pt's low back pain (pt has upcoming follow-up with her doctor regarding this issue). The majority of the session focused on gentle LE strengthening in hooklying. Pt was also ableto perform nustep intervention but did report increased fatigue with exercise. The pt will benefit from further skilled PT to increase LE strength, function, ambulatory capacity, and balance to increase QOL and decrease fall risk.    Personal Factors and Comorbidities Comorbidity 3+;Time since onset of injury/illness/exacerbation    Comorbidities HYPOtension, paraparesis, SCI without spinal bone injury, DDD, cervical cancer, acute kidney injury, GERD, asthma, bradycardia, L total hip replacement,  hypokalemia    Examination-Activity Limitations Squat;Lift;Stairs;Bend;Stand;Reach Overhead;Carry;Transfers    Examination-Participation Restrictions Cleaning;Laundry;Meal Prep;Community Activity;Driving;Occupation    Stability/Clinical Decision Making Evolving/Moderate complexity    Rehab Potential Fair    PT Frequency Other (comment)   1x/every other week   PT Duration 12 weeks    PT Treatment/Interventions ADLs/Self Care Home  Management;Aquatic Therapy;Gait training;Stair training;Functional mobility training;Therapeutic activities;Therapeutic exercise;Balance training;Moist Heat;Manual techniques;Patient/family education;Neuromuscular re-education;Passive range of motion;Energy conservation;Vestibular;Joint Manipulations;Spinal Manipulations    PT Next Visit Plan REview HEP, strenght and stair training as able, endurance    PT Home Exercise Plan Will review HEP from previous therapy on visit 2; no updates    Consulted and Agree with Plan of Care Patient             Patient will benefit from skilled therapeutic intervention in order to improve the following deficits and impairments:  Abnormal gait, Dizziness, Improper body mechanics, Decreased mobility, Cardiopulmonary status limiting activity, Decreased activity tolerance, Decreased endurance, Decreased range of motion, Decreased strength, Decreased balance, Decreased safety awareness, Difficulty walking, Impaired flexibility, Obesity  Visit Diagnosis: Muscle weakness (generalized)  Other abnormalities of gait and mobility  Other lack of coordination  Unsteadiness on feet  Chronic midline low back pain, unspecified whether sciatica present     Problem List Patient Active Problem List   Diagnosis Date Noted   Abnormality of gait 04/29/2021   Abnormal sensation in both ears 04/06/2021   Other adverse food reactions, not elsewhere classified, subsequent encounter 04/06/2021   Multiple drug allergies 04/06/2021   Insomnia due to medical condition 02/25/2021   Myofascial muscle pain 02/25/2021   Nerve pain 10/18/2020   Incomplete paraplegia ([place]) 10/18/2020   Orthostatic hypotension 10/18/2020   Chronic migraine without aura without status migrainosus, not intractable    Hypokalemia    Adjustment reaction with anxiety and depression    Spinal cord injury, lumbar, without spinal bone injury, sequela ([place]) 08/18/2020   Paraparesis ([facility])     Post-operative pain    Hypotension    Slow transit constipation    AKI (acute kidney injury) ([place])    Right leg weakness 08/11/2020   DDD (degenerative disc disease), lumbar 09/02/2019    Class: Chronic   Degenerative disc disease, lumbar 09/02/2019   Chest tightness 09/23/2018   Bradycardia 09/23/2018   Labile blood pressure 03/08/2018   Chronic low back pain 09/03/2017   History of total hip replacement, left 01/26/2016   EDEMA 04/28/2008   LEG PAIN, BILATERAL 12/16/2007   CERVICAL CANCER 07/02/2007   MORBID OBESITY 07/02/2007   DEPRESSION 07/02/2007   COMMON MIGRAINE 07/02/2007   Other allergic rhinitis 07/02/2007   ASTHMA 07/02/2007   GERD 07/02/2007   ELEVATED BLOOD PRESSURE WITHOUT DIAGNOSIS OF HYPERTENSION 07/02/2007    Zollie Pee, PT 09/15/2021, 12:16 PM  [facility] [facility] MAIN [facility] SERVICES [address] [place], [place], [address] Phone: [phone]   Fax:  [phone]  Name: Vanessa Medina MRN: [id number] Date of Birth: December 02, 1970

## 2021-09-19 ENCOUNTER — Ambulatory Visit: Payer: 59

## 2021-09-19 ENCOUNTER — Ambulatory Visit: Payer: 59 | Admitting: Physical Therapy

## 2021-09-19 ENCOUNTER — Other Ambulatory Visit: Payer: Self-pay

## 2021-09-19 DIAGNOSIS — R2681 Unsteadiness on feet: Secondary | ICD-10-CM

## 2021-09-19 DIAGNOSIS — R2689 Other abnormalities of gait and mobility: Secondary | ICD-10-CM

## 2021-09-19 DIAGNOSIS — R262 Difficulty in walking, not elsewhere classified: Secondary | ICD-10-CM

## 2021-09-19 DIAGNOSIS — M6281 Muscle weakness (generalized): Secondary | ICD-10-CM

## 2021-09-19 DIAGNOSIS — M545 Low back pain, unspecified: Secondary | ICD-10-CM | POA: Diagnosis not present

## 2021-09-19 NOTE — Therapy (Signed)
[place] [facility] SERVICES [address] [place], [place], [address] Phone: [phone]   Fax:  [phone]  Physical Therapy Treatment  Patient Details  Name: Vanessa Medina MRN: [id number] Date of Birth: Oct 14, 1970 No data recorded  Encounter Date: 09/19/2021   PT End of Session - 09/19/21 1644     Visit Number 4    Number of Visits 24    Date for PT Re-Evaluation 11/30/21    Authorization Type [employer] Other    Authorization Time Period 01/24/21-04/18/2021    PT Start Time 1600    PT Stop Time 1646    PT Time Calculation (min) 46 min    Equipment Utilized During Treatment Gait belt    Activity Tolerance Patient tolerated treatment well;Patient limited by pain    Behavior During Therapy WFL for tasks assessed/performed             Past Medical History:  Diagnosis Date   Anemia    Cervical cancer ([place])    Family history of adverse reaction to anesthesia     " my mother takes a long time time wake up"   GERD (gastroesophageal reflux disease)    Headache    migraine   History of dysplastic nevus 01/18/2018   right shoulder, recurrent dysplastic nevus   History of shingles    Hypertension    Migraine    Multiple allergies    Obesity    Osteoarthritis    left hip   PONV (postoperative nausea and vomiting)    Sleep apnea    does not wear CPAP   Stroke ([place]) 08/11/2020   Wears glasses     Past Surgical History:  Procedure Laterality Date   ABDOMINAL HYSTERECTOMY     APPENDECTOMY     BACK SURGERY     BILIOPANCREATIC DIVERSION     with duodenal switch laparoscopic    CHOLECYSTECTOMY     COLONOSCOPY N/A 06/27/2021   Procedure: COLONOSCOPY;  Surgeon: Annamaria Helling, DO;  Location: [facility] ENDOSCOPY;  Service: Gastroenterology;  Laterality: N/A;   DIAGNOSTIC LAPAROSCOPY     LOA   ESOPHAGOGASTRODUODENOSCOPY N/A 06/27/2021   Procedure: ESOPHAGOGASTRODUODENOSCOPY (EGD);  Surgeon: Annamaria Helling, DO;  Location:  [facility] ENDOSCOPY;  Service: Gastroenterology;  Laterality: N/A;   ESOPHAGOGASTRODUODENOSCOPY (EGD) WITH PROPOFOL N/A 07/25/2017   Procedure: ESOPHAGOGASTRODUODENOSCOPY (EGD) WITH PROPOFOL;  Surgeon: Manya Silvas, MD;  Location: [facility] ENDOSCOPY;  Service: Endoscopy;  Laterality: N/A;   KNEE ARTHROSCOPY     LAPAROSCOPIC GASTRIC SLEEVE RESECTION WITH HIATAL HERNIA REPAIR     TONSILLECTOMY     TOTAL HIP ARTHROPLASTY Left 01/26/2016   Procedure: LEFT TOTAL HIP ARTHROPLASTY ANTERIOR APPROACH;  Surgeon: Leandrew Koyanagi, MD;  Location: [place];  Service: Orthopedics;  Laterality: Left;   TRANSFORAMINAL LUMBAR INTERBODY FUSION (TLIF) WITH PEDICLE SCREW FIXATION 3 LEVEL  08/2019   Basil Dess, MD L2-L5    There were no vitals filed for this visit.   Subjective Assessment - 09/19/21 1604     Subjective Pt reports she has been performing her HEP. Pt reports she is more sore today, no new issues to report. Has had continued issues with BP.    Pertinent History 51 year old female with history of HTN, migraines, morbid obesity--BMI-41, chronic hypotension, multiple back surgeries with chronic pain with LLE lumbar radiculopathy who was scheduled to have spinal cord stimulator placed by Dr. Davy Pique on 08/11/2020 but unable to perform surgery due to scar tissue.  History taken from patient and chart review.  Post op on awakening, she had excruciating pain RLE with plegia and and allodynia. She was admitted for work up by Dr. Ronnald Ramp from surgical center on 08/11/2020. Thoracic MRI done revealing subtle increased T2/STIR intensity distal cord at T12-L1 level suspicious for edema/acute spinal cord injury, no CSF leak as well as prior PLIF L2-L5 with residual foraminal protrusion L3/5 potentially irritating right L3 nerve. She was started on IV Dilaudid, gabapentin as well as IV Decadron for management of pain, headaches and severe neuropathy.  She continues to have pain in RLE but is having some motor return, has constant  headache, decreased hearing in right> left ear, constipation with nausea as well as epigastric pain and weakness. Therapy ongoing and CIR recommended due to functional decline.  Of note, she reports 50 lbs weigh loss in the past 3-4 months--due to issues with N/V/intake. Has had two falls last month---no recall of incident leading to fall question due to syncope. Was in process of work up for renal disease. She has had issue with LLE weakness with pain for the past year and being followed by Dr. Ernestina Patches. Did well with stimulator trial.    How long can you sit comfortably? 1 hour (no recent change)    How long can you stand comfortably? 3-4 min    How long can you walk comfortably? 1/2 block at maximum    Patient Stated Goals Improve strength and limit pain    Pain Onset 1 to 4 weeks ago             INTERVENTIONS   Pt hook-lying on plinth with bolster under knees:   SAQ 3x10 each LE through pain-free range. PT provides assist for movement on the right. Toward end of reps on second and third set pt began to have involunvatry contraction of quad muscle, somewhat decreased in magnitude with initiation of L LE LAQ.   Assisted knee to chest march 3x10 each LE -able to achieve approximately 90 degrees of hip flexion prior to open end feel  Glute squeeze with TrA activation 10x with 2-3 sec holds  Glute bridge performed through partial range of motion 10x  Nustep level 0. Cuing for SPM in 40s-50s x 6 minutes. Pt rates as medium . Pt reports decreased with this tassk this session compared to previous session. - no CHARGE  Pt required occasional rest breaks due fatigue, PT was quick to ask when pt appeared to be fatiguing in order to prevent excessive fatigue.  Pt was assisted to valet parking by PT     Pt educated throughout session about proper posture and technique with exercises. Improved exercise technique, movement at target joints, use of target muscles after min to mod verbal, visual,  tactile cues.                           PT Education - 09/19/21 1605     Education Details Exercise technique, body mechanics    Person(s) Educated Patient    Methods Explanation;Demonstration;Tactile cues;Verbal cues    Comprehension Verbalized understanding;Returned demonstration;Verbal cues required              PT Short Term Goals - 09/07/21 1717       PT SHORT TERM GOAL #1   Title Pt will be independent with HEP in order to improve strength and balance in order to decrease fall risk and improve function at home and work.  Baseline Pt not compliant due to holidays, etc    Time 3    Period Weeks    Status New    Target Date 09/28/21               PT Long Term Goals - 09/07/21 1719       PT LONG TERM GOAL #1   Title Patient will complete five times sit to stand test in < 20 seconds indicating an increased LE strength, power, and improved balance.    Baseline 36.86 sec 09/07/21    Time 12    Period Weeks    Status New    Target Date 11/30/21      PT LONG TERM GOAL #2   Title Pt will decrease TUG to below 14 seconds/decrease in order to demonstrate decreased fall risk.    Baseline TUG 22.99sec    Time 12    Period Weeks    Status New    Target Date 11/30/21      PT LONG TERM GOAL #3   Title Pt will decrease DHI score by at least 18 points in order to demonstrate clinically significant reduction in disability    Baseline 54 on 09/07/21   Not reassessed, but reports no significant change/improvement with dizziness   Time 12    Period Weeks    Status New      PT LONG TERM GOAL #4   Title Patient will increase 10 meter walk test to >1.47ms as to improve gait speed for better community ambulation and to reduce fall risk.    Baseline .41 on 09/07/21 with SPC    Time 12    Period Weeks    Status Partially Met      PT LONG TERM GOAL #5   Title Patient will increase FOTO score to equal to or greater than 57 to demonstrate statistically  significant improvement in mobility and quality of life.    Baseline 45 on 09/07/21    Time 12    Period Weeks    Status New      PT LONG TERM GOAL #6   Title Patient will increase six minute walk test distance to >1000 for progression to community ambulator and improve gait ability    Baseline Not assessed visit 1    Time 12    Period Weeks    Status New    Target Date 11/30/21      PT LONG TERM GOAL #7   Title Patient will ascend/descend 2- 3 steps with 1 rail and use of SPC with modified [place] in front of house to safely enter/exit home.    Baseline Pt required cues, increased time and CGA for safety on standard height steps at IE, unable to complete front steps at home at this time    Time 12    Period Weeks    Status New    Target Date 11/30/21                   Plan - 09/20/21 0824     Clinical Impression Statement Patient demonstrates excellent motivation for completion of physical therapy program.  Patient continues to demonstrate increased weakness and fatigue on the right lower extremity with various activities in today's session.  Patient begins to fatigue patient has symptoms of involuntary contractions and right lower extremity.  Patient did demonstrate improved tolerance to NuStep as she rated as moderate difficulty compared to high difficulty last session.  Patient continuing to make  progress with therapy, however, therapy is limited due to patient's low back pain and discomfort which is being addressed by Dr.'s appointment in early February.  Patient will continue to benefit from skilled physical therapy intervention in order to increase lower extremity strength, function, balance, and increase her quality of life and overall function..    Personal Factors and Comorbidities Comorbidity 3+;Time since onset of injury/illness/exacerbation    Comorbidities HYPOtension, paraparesis, SCI without spinal bone injury, DDD, cervical cancer, acute kidney injury, GERD,  asthma, bradycardia, L total hip replacement, hypokalemia    Examination-Activity Limitations Squat;Lift;Stairs;Bend;Stand;Reach Overhead;Carry;Transfers    Examination-Participation Restrictions Cleaning;Laundry;Meal Prep;Community Activity;Driving;Occupation    Stability/Clinical Decision Making Evolving/Moderate complexity    Rehab Potential Fair    PT Frequency Other (comment)   1x/every other week   PT Duration 12 weeks    PT Treatment/Interventions ADLs/Self Care Home Management;Aquatic Therapy;Gait training;Stair training;Functional mobility training;Therapeutic activities;Therapeutic exercise;Balance training;Moist Heat;Manual techniques;Patient/family education;Neuromuscular re-education;Passive range of motion;Energy conservation;Vestibular;Joint Manipulations;Spinal Manipulations    PT Next Visit Plan REview HEP, strenght and stair training as able, endurance    PT Home Exercise Plan Will review HEP from previous therapy on visit 2; no updates    Consulted and Agree with Plan of Care Patient             Patient will benefit from skilled therapeutic intervention in order to improve the following deficits and impairments:  Abnormal gait, Dizziness, Improper body mechanics, Decreased mobility, Cardiopulmonary status limiting activity, Decreased activity tolerance, Decreased endurance, Decreased range of motion, Decreased strength, Decreased balance, Decreased safety awareness, Difficulty walking, Impaired flexibility, Obesity  Visit Diagnosis: Muscle weakness (generalized)  Other abnormalities of gait and mobility  Unsteadiness on feet  Difficulty in walking, not elsewhere classified     Problem List Patient Active Problem List   Diagnosis Date Noted   Abnormality of gait 04/29/2021   Abnormal sensation in both ears 04/06/2021   Other adverse food reactions, not elsewhere classified, subsequent encounter 04/06/2021   Multiple drug allergies 04/06/2021   Insomnia due to  medical condition 02/25/2021   Myofascial muscle pain 02/25/2021   Nerve pain 10/18/2020   Incomplete paraplegia ([place]) 10/18/2020   Orthostatic hypotension 10/18/2020   Chronic migraine without aura without status migrainosus, not intractable    Hypokalemia    Adjustment reaction with anxiety and depression    Spinal cord injury, lumbar, without spinal bone injury, sequela ([place]) 08/18/2020   Paraparesis ([place])    Post-operative pain    Hypotension    Slow transit constipation    AKI (acute kidney injury) ([place])    Right leg weakness 08/11/2020   DDD (degenerative disc disease), lumbar 09/02/2019    Class: Chronic   Degenerative disc disease, lumbar 09/02/2019   Chest tightness 09/23/2018   Bradycardia 09/23/2018   Labile blood pressure 03/08/2018   Chronic low back pain 09/03/2017   History of total hip replacement, left 01/26/2016   EDEMA 04/28/2008   LEG PAIN, BILATERAL 12/16/2007   CERVICAL CANCER 07/02/2007   MORBID OBESITY 07/02/2007   DEPRESSION 07/02/2007   COMMON MIGRAINE 07/02/2007   Other allergic rhinitis 07/02/2007   ASTHMA 07/02/2007   GERD 07/02/2007   ELEVATED BLOOD PRESSURE WITHOUT DIAGNOSIS OF HYPERTENSION 07/02/2007    Particia Lather, PT 09/20/2021, 8:27 AM  [place] [address] [place], [place], [address] Phone: [phone]   Fax:  [phone]  Name: Vanessa Medina MRN: [id number] Date of Birth: Jun 13, 1971

## 2021-09-21 ENCOUNTER — Ambulatory Visit: Payer: 59

## 2021-09-21 ENCOUNTER — Other Ambulatory Visit: Payer: Self-pay

## 2021-09-21 DIAGNOSIS — R2689 Other abnormalities of gait and mobility: Secondary | ICD-10-CM

## 2021-09-21 DIAGNOSIS — M545 Low back pain, unspecified: Secondary | ICD-10-CM | POA: Diagnosis not present

## 2021-09-21 DIAGNOSIS — R2681 Unsteadiness on feet: Secondary | ICD-10-CM

## 2021-09-21 DIAGNOSIS — M6281 Muscle weakness (generalized): Secondary | ICD-10-CM

## 2021-09-21 NOTE — Therapy (Signed)
[place] MAIN [facility] SERVICES [address] [place], [place], [address] Phone: [phone]   Fax:  [phone]  Physical Therapy Treatment  Patient Details  Name: Vanessa Medina MRN: [id number] Date of Birth: April 18, 1971 No data recorded  Encounter Date: 09/21/2021   PT End of Session - 09/21/21 1600     Visit Number 5    Number of Visits 24    Date for PT Re-Evaluation 11/30/21    Authorization Type [employer] Other    Authorization Time Period 01/24/21-04/18/2021    PT Start Time 1600    PT Stop Time 1644    PT Time Calculation (min) 44 min    Equipment Utilized During Treatment Gait belt    Activity Tolerance Patient tolerated treatment well;Patient limited by pain    Behavior During Therapy WFL for tasks assessed/performed             Past Medical History:  Diagnosis Date   Anemia    Cervical cancer ([place])    Family history of adverse reaction to anesthesia     " my mother takes a long time time wake up"   GERD (gastroesophageal reflux disease)    Headache    migraine   History of dysplastic nevus 01/18/2018   right shoulder, recurrent dysplastic nevus   History of shingles    Hypertension    Migraine    Multiple allergies    Obesity    Osteoarthritis    left hip   PONV (postoperative nausea and vomiting)    Sleep apnea    does not wear CPAP   Stroke ([place]) 08/11/2020   Wears glasses     Past Surgical History:  Procedure Laterality Date   ABDOMINAL HYSTERECTOMY     APPENDECTOMY     BACK SURGERY     BILIOPANCREATIC DIVERSION     with duodenal switch laparoscopic    CHOLECYSTECTOMY     COLONOSCOPY N/A 06/27/2021   Procedure: COLONOSCOPY;  Surgeon: Annamaria Helling, DO;  Location: [facility] ENDOSCOPY;  Service: Gastroenterology;  Laterality: N/A;   DIAGNOSTIC LAPAROSCOPY     LOA   ESOPHAGOGASTRODUODENOSCOPY N/A 06/27/2021   Procedure: ESOPHAGOGASTRODUODENOSCOPY (EGD);  Surgeon: Annamaria Helling, DO;  Location:  [facility] ENDOSCOPY;  Service: Gastroenterology;  Laterality: N/A;   ESOPHAGOGASTRODUODENOSCOPY (EGD) WITH PROPOFOL N/A 07/25/2017   Procedure: ESOPHAGOGASTRODUODENOSCOPY (EGD) WITH PROPOFOL;  Surgeon: Manya Silvas, MD;  Location: [facility] ENDOSCOPY;  Service: Endoscopy;  Laterality: N/A;   KNEE ARTHROSCOPY     LAPAROSCOPIC GASTRIC SLEEVE RESECTION WITH HIATAL HERNIA REPAIR     TONSILLECTOMY     TOTAL HIP ARTHROPLASTY Left 01/26/2016   Procedure: LEFT TOTAL HIP ARTHROPLASTY ANTERIOR APPROACH;  Surgeon: Leandrew Koyanagi, MD;  Location: [place];  Service: Orthopedics;  Laterality: Left;   TRANSFORAMINAL LUMBAR INTERBODY FUSION (TLIF) WITH PEDICLE SCREW FIXATION 3 LEVEL  08/2019   Basil Dess, MD L2-L5    There were no vitals filed for this visit.   Subjective Assessment - 09/21/21 1610     Subjective Patient reports she woke up with heart racing/pounding. Took her blood pressure and it was low. Hasn't really felt better as the day went on.    Pertinent History 51 year old female with history of HTN, migraines, morbid obesity--BMI-41, chronic hypotension, multiple back surgeries with chronic pain with LLE lumbar radiculopathy who was scheduled to have spinal cord stimulator placed by Dr. Davy Pique on 08/11/2020 but unable to perform surgery due to scar tissue.  History  taken from patient and chart review.  Post op on awakening, she had excruciating pain RLE with plegia and and allodynia. She was admitted for work up by Dr. Ronnald Ramp from surgical center on 08/11/2020. Thoracic MRI done revealing subtle increased T2/STIR intensity distal cord at T12-L1 level suspicious for edema/acute spinal cord injury, no CSF leak as well as prior PLIF L2-L5 with residual foraminal protrusion L3/5 potentially irritating right L3 nerve. She was started on IV Dilaudid, gabapentin as well as IV Decadron for management of pain, headaches and severe neuropathy.  She continues to have pain in RLE but is having some motor return, has constant  headache, decreased hearing in right> left ear, constipation with nausea as well as epigastric pain and weakness. Therapy ongoing and CIR recommended due to functional decline.  Of note, she reports 50 lbs weigh loss in the past 3-4 months--due to issues with N/V/intake. Has had two falls last month---no recall of incident leading to fall question due to syncope. Was in process of work up for renal disease. She has had issue with LLE weakness with pain for the past year and being followed by Dr. Ernestina Patches. Did well with stimulator trial.    How long can you sit comfortably? 1 hour (no recent change)    How long can you stand comfortably? 3-4 min    How long can you walk comfortably? 1/2 block at maximum    Patient Stated Goals Improve strength and limit pain    Currently in Pain? Yes    Pain Score 6     Pain Location Back    Pain Orientation Lower    Pain Descriptors / Indicators Aching    Pain Type Chronic pain    Pain Onset 1 to 4 weeks ago    Pain Frequency Constant                   Patient reports she woke up with heart racing/pounding. Took her blood pressure and it was low. Hasn't really felt better as the day went on.    INTERVENTIONS    BP at start of session: 102/69   Seated due to patient blood pressure TrA contraction with posterior tilt 10x 3 second   Green swiss ball TrA contraction 10x 3 second holds   Green swiss ball UE raises 10x each UE Hamstring isometric pressing into dynadisc 10x 3 second holds each LE   3lb ankle weights: -March 10x each LE -LAQ 10x each LE   -ER/IR 10x each LE -heel raise 15x -toe raise 15x  GTB:  Abduction 15x; 3 second holds cues for core activation  Adduction 15x each LE Hamstring curl 15x each LE Row 15x each LE  Pt required occasional rest breaks due fatigue, PT was quick to ask when pt appeared to be fatiguing in order to prevent excessive fatigue.   Pt was assisted to valet parking by PT     Pt educated throughout  session about proper posture and technique with exercises. Improved exercise technique, movement at target joints, use of target muscles after min to mod verbal, visual, tactile cues.   Patient presents to physical with excellent motivation despite fatigue. Patient tolerates strengthening and core interventions well with minimal pain increase. Heat pad behind back reduced tension during session. Marching is very challenging for patient. Patient will continue to benefit from skilled physical therapy intervention in order to increase lower extremity strength, function, balance, and increase her quality of life and overall function  PT Education - 09/21/21 1600     Education Details exercise technique, body mechanics    Person(s) Educated Patient    Methods Explanation;Demonstration;Tactile cues;Verbal cues    Comprehension Verbalized understanding;Returned demonstration;Verbal cues required;Tactile cues required              PT Short Term Goals - 09/07/21 1717       PT SHORT TERM GOAL #1   Title Pt will be independent with HEP in order to improve strength and balance in order to decrease fall risk and improve function at home and work.    Baseline Pt not compliant due to holidays, etc    Time 3    Period Weeks    Status New    Target Date 09/28/21               PT Long Term Goals - 09/07/21 1719       PT LONG TERM GOAL #1   Title Patient will complete five times sit to stand test in < 20 seconds indicating an increased LE strength, power, and improved balance.    Baseline 36.86 sec 09/07/21    Time 12    Period Weeks    Status New    Target Date 11/30/21      PT LONG TERM GOAL #2   Title Pt will decrease TUG to below 14 seconds/decrease in order to demonstrate decreased fall risk.    Baseline TUG 22.99sec    Time 12    Period Weeks    Status New    Target Date 11/30/21      PT LONG TERM GOAL #3   Title Pt will decrease DHI score by at least  18 points in order to demonstrate clinically significant reduction in disability    Baseline 54 on 09/07/21   Not reassessed, but reports no significant change/improvement with dizziness   Time 12    Period Weeks    Status New      PT LONG TERM GOAL #4   Title Patient will increase 10 meter walk test to >1.77ms as to improve gait speed for better community ambulation and to reduce fall risk.    Baseline .41 on 09/07/21 with SPC    Time 12    Period Weeks    Status Partially Met      PT LONG TERM GOAL #5   Title Patient will increase FOTO score to equal to or greater than 57 to demonstrate statistically significant improvement in mobility and quality of life.    Baseline 45 on 09/07/21    Time 12    Period Weeks    Status New      PT LONG TERM GOAL #6   Title Patient will increase six minute walk test distance to >1000 for progression to community ambulator and improve gait ability    Baseline Not assessed visit 1    Time 12    Period Weeks    Status New    Target Date 11/30/21      PT LONG TERM GOAL #7   Title Patient will ascend/descend 2- 3 steps with 1 rail and use of SPC with modified [place] in front of house to safely enter/exit home.    Baseline Pt required cues, increased time and CGA for safety on standard height steps at IE, unable to complete front steps at home at this time    Time 12    Period Weeks    Status New  Target Date 11/30/21                   Plan - 09/21/21 1617     Clinical Impression Statement Patient presents to physical with excellent motivation despite fatigue. Patient tolerates strengthening and core interventions well with minimal pain increase. Heat pad behind back reduced tension during session. Marching is very challenging for patient. Patient will continue to benefit from skilled physical therapy intervention in order to increase lower extremity strength, function, balance, and increase her quality of life and overall function     Personal Factors and Comorbidities Comorbidity 3+;Time since onset of injury/illness/exacerbation    Comorbidities HYPOtension, paraparesis, SCI without spinal bone injury, DDD, cervical cancer, acute kidney injury, GERD, asthma, bradycardia, L total hip replacement, hypokalemia    Examination-Activity Limitations Squat;Lift;Stairs;Bend;Stand;Reach Overhead;Carry;Transfers    Examination-Participation Restrictions Cleaning;Laundry;Meal Prep;Community Activity;Driving;Occupation    Stability/Clinical Decision Making Evolving/Moderate complexity    Rehab Potential Fair    PT Frequency Other (comment)   1x/every other week   PT Duration 12 weeks    PT Treatment/Interventions ADLs/Self Care Home Management;Aquatic Therapy;Gait training;Stair training;Functional mobility training;Therapeutic activities;Therapeutic exercise;Balance training;Moist Heat;Manual techniques;Patient/family education;Neuromuscular re-education;Passive range of motion;Energy conservation;Vestibular;Joint Manipulations;Spinal Manipulations    PT Next Visit Plan REview HEP, strenght and stair training as able, endurance    PT Home Exercise Plan Will review HEP from previous therapy on visit 2; no updates    Consulted and Agree with Plan of Care Patient             Patient will benefit from skilled therapeutic intervention in order to improve the following deficits and impairments:  Abnormal gait, Dizziness, Improper body mechanics, Decreased mobility, Cardiopulmonary status limiting activity, Decreased activity tolerance, Decreased endurance, Decreased range of motion, Decreased strength, Decreased balance, Decreased safety awareness, Difficulty walking, Impaired flexibility, Obesity  Visit Diagnosis: Muscle weakness (generalized)  Other abnormalities of gait and mobility  Unsteadiness on feet     Problem List Patient Active Problem List   Diagnosis Date Noted   Abnormality of gait 04/29/2021   Abnormal sensation  in both ears 04/06/2021   Other adverse food reactions, not elsewhere classified, subsequent encounter 04/06/2021   Multiple drug allergies 04/06/2021   Insomnia due to medical condition 02/25/2021   Myofascial muscle pain 02/25/2021   Nerve pain 10/18/2020   Incomplete paraplegia ([place]) 10/18/2020   Orthostatic hypotension 10/18/2020   Chronic migraine without aura without status migrainosus, not intractable    Hypokalemia    Adjustment reaction with anxiety and depression    Spinal cord injury, lumbar, without spinal bone injury, sequela ([place]) 08/18/2020   Paraparesis ([place])    Post-operative pain    Hypotension    Slow transit constipation    AKI (acute kidney injury) ([place])    Right leg weakness 08/11/2020   DDD (degenerative disc disease), lumbar 09/02/2019    Class: Chronic   Degenerative disc disease, lumbar 09/02/2019   Chest tightness 09/23/2018   Bradycardia 09/23/2018   Labile blood pressure 03/08/2018   Chronic low back pain 09/03/2017   History of total hip replacement, left 01/26/2016   EDEMA 04/28/2008   LEG PAIN, BILATERAL 12/16/2007   CERVICAL CANCER 07/02/2007   MORBID OBESITY 07/02/2007   DEPRESSION 07/02/2007   COMMON MIGRAINE 07/02/2007   Other allergic rhinitis 07/02/2007   ASTHMA 07/02/2007   GERD 07/02/2007   ELEVATED BLOOD PRESSURE WITHOUT DIAGNOSIS OF HYPERTENSION 07/02/2007    Janna Arch, PT, DPT  09/21/2021, 4:45 PM  [place]  [place] MAIN [facility] SERVICES [place], [place], [address] Phone: [phone]   Fax:  [phone]  Name: Vanessa Medina MRN: [id number] Date of Birth: 1971/02/12

## 2021-09-26 ENCOUNTER — Other Ambulatory Visit: Payer: Self-pay

## 2021-09-26 ENCOUNTER — Ambulatory Visit: Payer: 59 | Admitting: Physical Therapy

## 2021-09-26 ENCOUNTER — Ambulatory Visit: Payer: 59

## 2021-09-26 DIAGNOSIS — M545 Low back pain, unspecified: Secondary | ICD-10-CM

## 2021-09-26 DIAGNOSIS — G8929 Other chronic pain: Secondary | ICD-10-CM

## 2021-09-26 DIAGNOSIS — R2681 Unsteadiness on feet: Secondary | ICD-10-CM

## 2021-09-26 DIAGNOSIS — R262 Difficulty in walking, not elsewhere classified: Secondary | ICD-10-CM

## 2021-09-26 NOTE — Therapy (Signed)
[facility] SERVICES [address] [place], [place], [address] Phone: [phone]   Fax:  [phone]  Physical Therapy Treatment  Patient Details  Name: Vanessa Medina MRN: [id number] Date of Birth: 1971-06-26 No data recorded  Encounter Date: 09/26/2021   PT End of Session - 09/26/21 1607     Visit Number 6    Number of Visits 24    Date for PT Re-Evaluation 11/30/21    Authorization Type U[employer] Other    Authorization Time Period 01/24/21-04/18/2021    PT Start Time 1559    PT Stop Time 1644    PT Time Calculation (min) 45 min    Equipment Utilized During Treatment Gait belt    Activity Tolerance Patient tolerated treatment well;Patient limited by pain    Behavior During Therapy WFL for tasks assessed/performed             Past Medical History:  Diagnosis Date   Anemia    Cervical cancer ([place])    Family history of adverse reaction to anesthesia     " my mother takes a long time time wake up"   GERD (gastroesophageal reflux disease)    Headache    migraine   History of dysplastic nevus 01/18/2018   right shoulder, recurrent dysplastic nevus   History of shingles    Hypertension    Migraine    Multiple allergies    Obesity    Osteoarthritis    left hip   PONV (postoperative nausea and vomiting)    Sleep apnea    does not wear CPAP   Stroke ([place]) 08/11/2020   Wears glasses     Past Surgical History:  Procedure Laterality Date   ABDOMINAL HYSTERECTOMY     APPENDECTOMY     BACK SURGERY     BILIOPANCREATIC DIVERSION     with duodenal switch laparoscopic    CHOLECYSTECTOMY     COLONOSCOPY N/A 06/27/2021   Procedure: COLONOSCOPY;  Surgeon: Annamaria Helling, DO;  Location: [facility] ENDOSCOPY;  Service: Gastroenterology;  Laterality: N/A;   DIAGNOSTIC LAPAROSCOPY     LOA   ESOPHAGOGASTRODUODENOSCOPY N/A 06/27/2021   Procedure: ESOPHAGOGASTRODUODENOSCOPY (EGD);  Surgeon: Annamaria Helling, DO;  Location:  [facility] ENDOSCOPY;  Service: Gastroenterology;  Laterality: N/A;   ESOPHAGOGASTRODUODENOSCOPY (EGD) WITH PROPOFOL N/A 07/25/2017   Procedure: ESOPHAGOGASTRODUODENOSCOPY (EGD) WITH PROPOFOL;  Surgeon: Manya Silvas, MD;  Location: [facility] ENDOSCOPY;  Service: Endoscopy;  Laterality: N/A;   KNEE ARTHROSCOPY     LAPAROSCOPIC GASTRIC SLEEVE RESECTION WITH HIATAL HERNIA REPAIR     TONSILLECTOMY     TOTAL HIP ARTHROPLASTY Left 01/26/2016   Procedure: LEFT TOTAL HIP ARTHROPLASTY ANTERIOR APPROACH;  Surgeon: Leandrew Koyanagi, MD;  Location: [facility];  Service: Orthopedics;  Laterality: Left;   TRANSFORAMINAL LUMBAR INTERBODY FUSION (TLIF) WITH PEDICLE SCREW FIXATION 3 LEVEL  08/2019   Basil Dess, MD L2-L5    There were no vitals filed for this visit.   Subjective Assessment - 09/26/21 1606     Subjective Pt reports significant vertigo over the weekend. Reports she has improved symptoms at this time.    Pertinent History 51 year old female with history of HTN, migraines, morbid obesity--BMI-41, chronic hypotension, multiple back surgeries with chronic pain with LLE lumbar radiculopathy who was scheduled to have spinal cord stimulator placed by Dr. Davy Pique on 08/11/2020 but unable to perform surgery due to scar tissue.  History taken from patient and chart review.  Post op on  awakening, she had excruciating pain RLE with plegia and and allodynia. She was admitted for work up by Dr. Ronnald Ramp from surgical center on 08/11/2020. Thoracic MRI done revealing subtle increased T2/STIR intensity distal cord at T12-L1 level suspicious for edema/acute spinal cord injury, no CSF leak as well as prior PLIF L2-L5 with residual foraminal protrusion L3/5 potentially irritating right L3 nerve. She was started on IV Dilaudid, gabapentin as well as IV Decadron for management of pain, headaches and severe neuropathy.  She continues to have pain in RLE but is having some motor return, has constant headache, decreased hearing in right> left  ear, constipation with nausea as well as epigastric pain and weakness. Therapy ongoing and CIR recommended due to functional decline.  Of note, she reports 50 lbs weigh loss in the past 3-4 months--due to issues with N/V/intake. Has had two falls last month---no recall of incident leading to fall question due to syncope. Was in process of work up for renal disease. She has had issue with LLE weakness with pain for the past year and being followed by Dr. Ernestina Patches. Did well with stimulator trial.    Limitations Standing;Walking;House hold activities    How long can you sit comfortably? 1 hour (no recent change)    How long can you stand comfortably? 3-4 min    How long can you walk comfortably? 1/2 block at maximum    Patient Stated Goals Improve strength and limit pain    Pain Onset 1 to 4 weeks ago             INTERVENTIONS    BP at start of session: 115/75   Nustep level 4 min, level 1 x 3 min for LE strength and endurance   3lb ankle weights:performed all exercises bilaterally  -March 10x each LE, very challenging for RLE with 3# weight  -LAQ 10x each LE  no weight R LE, significant LE trembling with end of reps on the right LE -heel raise 2x10 -toe raise 2x10  Seated, berfomred all exercises bilaterally  Abduction/ER 10 x; 3 second holds  RTB  Adduction 15x each LE, 3 second holds  Hamstring curl 10x R LE, x 15 L LE  UE Row x 15 RTB   Pt required occasional rest breaks due fatigue, PT was quick to ask when pt appeared to be fatiguing in order to prevent excessive fatigue.   Pt was assisted to valet parking by PT     Pt educated throughout session about proper posture and technique with exercises. Improved exercise technique, movement at target joints, use of target muscles after min to mod verbal, visual, tactile cues.                                   PT Education - 09/26/21 1655     Education Details exercise technique, body mechanics     Person(s) Educated Patient    Methods Explanation;Demonstration;Tactile cues;Verbal cues    Comprehension Verbalized understanding;Returned demonstration;Verbal cues required;Tactile cues required              PT Short Term Goals - 09/07/21 1717       PT SHORT TERM GOAL #1   Title Pt will be independent with HEP in order to improve strength and balance in order to decrease fall risk and improve function at home and work.    Baseline Pt not compliant due to holidays, etc  Time 3    Period Weeks    Status New    Target Date 09/28/21               PT Long Term Goals - 09/07/21 1719       PT LONG TERM GOAL #1   Title Patient will complete five times sit to stand test in < 20 seconds indicating an increased LE strength, power, and improved balance.    Baseline 36.86 sec 09/07/21    Time 12    Period Weeks    Status New    Target Date 11/30/21      PT LONG TERM GOAL #2   Title Pt will decrease TUG to below 14 seconds/decrease in order to demonstrate decreased fall risk.    Baseline TUG 22.99sec    Time 12    Period Weeks    Status New    Target Date 11/30/21      PT LONG TERM GOAL #3   Title Pt will decrease DHI score by at least 18 points in order to demonstrate clinically significant reduction in disability    Baseline 54 on 09/07/21   Not reassessed, but reports no significant change/improvement with dizziness   Time 12    Period Weeks    Status New      PT LONG TERM GOAL #4   Title Patient will increase 10 meter walk test to >1.81ms as to improve gait speed for better community ambulation and to reduce fall risk.    Baseline .41 on 09/07/21 with SPC    Time 12    Period Weeks    Status Partially Met      PT LONG TERM GOAL #5   Title Patient will increase FOTO score to equal to or greater than 57 to demonstrate statistically significant improvement in mobility and quality of life.    Baseline 45 on 09/07/21    Time 12    Period Weeks    Status New      PT  LONG TERM GOAL #6   Title Patient will increase six minute walk test distance to >1000 for progression to community ambulator and improve gait ability    Baseline Not assessed visit 1    Time 12    Period Weeks    Status New    Target Date 11/30/21      PT LONG TERM GOAL #7   Title Patient will ascend/descend 2- 3 steps with 1 rail and use of SPC with modified [place] in front of house to safely enter/exit home.    Baseline Pt required cues, increased time and CGA for safety on standard height steps at IE, unable to complete front steps at home at this time    Time 12    Period Weeks    Status New    Target Date 11/30/21                   Plan - 09/26/21 1608     Clinical Impression Statement Patient presents to physical with excellent motivation for completion og PT exercises. Pt continues to have increased difficulty with tasks involving R LE compared to L LE and resistance had to be altered for R LE with some exercises to allow copmletion with proper form and without undue fatigue. Pt reports she some fatigue at end of session and felt like she was challenged this session. Patient will continue to benefit from skilled physical therapy intervention in order to increase lower  extremity strength, function, balance, and increase her quality of life and overall function    Personal Factors and Comorbidities Comorbidity 3+;Time since onset of injury/illness/exacerbation    Comorbidities HYPOtension, paraparesis, SCI without spinal bone injury, DDD, cervical cancer, acute kidney injury, GERD, asthma, bradycardia, L total hip replacement, hypokalemia    Examination-Activity Limitations Squat;Lift;Stairs;Bend;Stand;Reach Overhead;Carry;Transfers    Examination-Participation Restrictions Cleaning;Laundry;Meal Prep;Community Activity;Driving;Occupation    Stability/Clinical Decision Making Evolving/Moderate complexity    Rehab Potential Fair    PT Frequency Other (comment)   1x/every  other week   PT Duration 12 weeks    PT Treatment/Interventions ADLs/Self Care Home Management;Aquatic Therapy;Gait training;Stair training;Functional mobility training;Therapeutic activities;Therapeutic exercise;Balance training;Moist Heat;Manual techniques;Patient/family education;Neuromuscular re-education;Passive range of motion;Energy conservation;Vestibular;Joint Manipulations;Spinal Manipulations    PT Next Visit Plan REview HEP, strenght and stair training as able, endurance    PT Home Exercise Plan Will review HEP from previous therapy on visit 2; no updates    Consulted and Agree with Plan of Care Patient             Patient will benefit from skilled therapeutic intervention in order to improve the following deficits and impairments:  Abnormal gait, Dizziness, Improper body mechanics, Decreased mobility, Cardiopulmonary status limiting activity, Decreased activity tolerance, Decreased endurance, Decreased range of motion, Decreased strength, Decreased balance, Decreased safety awareness, Difficulty walking, Impaired flexibility, Obesity  Visit Diagnosis: Unsteadiness on feet  Difficulty in walking, not elsewhere classified  Chronic midline low back pain, unspecified whether sciatica present     Problem List Patient Active Problem List   Diagnosis Date Noted   Abnormality of gait 04/29/2021   Abnormal sensation in both ears 04/06/2021   Other adverse food reactions, not elsewhere classified, subsequent encounter 04/06/2021   Multiple drug allergies 04/06/2021   Insomnia due to medical condition 02/25/2021   Myofascial muscle pain 02/25/2021   Nerve pain 10/18/2020   Incomplete paraplegia ([place]) 10/18/2020   Orthostatic hypotension 10/18/2020   Chronic migraine without aura without status migrainosus, not intractable    Hypokalemia    Adjustment reaction with anxiety and depression    Spinal cord injury, lumbar, without spinal bone injury, sequela ([place]) 08/18/2020    Paraparesis ([place])    Post-operative pain    Hypotension    Slow transit constipation    AKI (acute kidney injury) ([place])    Right leg weakness 08/11/2020   DDD (degenerative disc disease), lumbar 09/02/2019    Class: Chronic   Degenerative disc disease, lumbar 09/02/2019   Chest tightness 09/23/2018   Bradycardia 09/23/2018   Labile blood pressure 03/08/2018   Chronic low back pain 09/03/2017   History of total hip replacement, left 01/26/2016   EDEMA 04/28/2008   LEG PAIN, BILATERAL 12/16/2007   CERVICAL CANCER 07/02/2007   MORBID OBESITY 07/02/2007   DEPRESSION 07/02/2007   COMMON MIGRAINE 07/02/2007   Other allergic rhinitis 07/02/2007   ASTHMA 07/02/2007   GERD 07/02/2007   ELEVATED BLOOD PRESSURE WITHOUT DIAGNOSIS OF HYPERTENSION 07/02/2007    Particia Lather, PT 09/26/2021, 4:58 PM  [facility] [address] [place], [place], [address] Phone: [phone]   Fax:  [phone]  Name: Vanessa Medina MRN: [id number] Date of Birth: 25-Aug-1971

## 2021-09-27 ENCOUNTER — Other Ambulatory Visit: Payer: Self-pay | Admitting: Specialist

## 2021-09-28 ENCOUNTER — Other Ambulatory Visit: Payer: Self-pay

## 2021-09-28 ENCOUNTER — Ambulatory Visit: Payer: 59

## 2021-09-28 ENCOUNTER — Ambulatory Visit
Admission: RE | Admit: 2021-09-28 | Discharge: 2021-09-28 | Disposition: A | Discharge status: Home / Self Care | Payer: 59 | Source: Ambulatory Visit | Attending: Student | Admitting: Student

## 2021-09-28 DIAGNOSIS — M545 Low back pain, unspecified: Secondary | ICD-10-CM | POA: Diagnosis not present

## 2021-09-28 DIAGNOSIS — R2689 Other abnormalities of gait and mobility: Secondary | ICD-10-CM

## 2021-09-28 DIAGNOSIS — R002 Palpitations: Secondary | ICD-10-CM

## 2021-09-28 DIAGNOSIS — R278 Other lack of coordination: Secondary | ICD-10-CM

## 2021-09-28 DIAGNOSIS — M6281 Muscle weakness (generalized): Secondary | ICD-10-CM

## 2021-09-28 DIAGNOSIS — R072 Precordial pain: Secondary | ICD-10-CM

## 2021-09-28 NOTE — Therapy (Signed)
[place] MAIN [facility] SERVICES [address] [place], [place], [address] Phone: [phone]   Fax:  [phone]  Physical Therapy Treatment  Patient Details  Name: Vanessa Medina MRN: [id number] Date of Birth: Oct 12, 1970 No data recorded  Encounter Date: 09/28/2021   PT End of Session - 09/28/21 1918     Visit Number 7    Number of Visits 24    Date for PT Re-Evaluation 11/30/21    Authorization Type [employer] Other    Authorization Time Period 01/24/21-04/18/2021    PT Start Time 1617    PT Stop Time 1659    PT Time Calculation (min) 42 min    Equipment Utilized During Treatment Gait belt    Activity Tolerance Patient tolerated treatment well    Behavior During Therapy WFL for tasks assessed/performed             Past Medical History:  Diagnosis Date   Anemia    Cervical cancer ([place])    Family history of adverse reaction to anesthesia     " my mother takes a long time time wake up"   GERD (gastroesophageal reflux disease)    Headache    migraine   History of dysplastic nevus 01/18/2018   right shoulder, recurrent dysplastic nevus   History of shingles    Hypertension    Migraine    Multiple allergies    Obesity    Osteoarthritis    left hip   PONV (postoperative nausea and vomiting)    Sleep apnea    does not wear CPAP   Stroke ([place]) 08/11/2020   Wears glasses     Past Surgical History:  Procedure Laterality Date   ABDOMINAL HYSTERECTOMY     APPENDECTOMY     BACK SURGERY     BILIOPANCREATIC DIVERSION     with duodenal switch laparoscopic    CHOLECYSTECTOMY     COLONOSCOPY N/A 06/27/2021   Procedure: COLONOSCOPY;  Surgeon: Annamaria Helling, DO;  Location: [facility] ENDOSCOPY;  Service: Gastroenterology;  Laterality: N/A;   DIAGNOSTIC LAPAROSCOPY     LOA   ESOPHAGOGASTRODUODENOSCOPY N/A 06/27/2021   Procedure: ESOPHAGOGASTRODUODENOSCOPY (EGD);  Surgeon: Annamaria Helling, DO;  Location: [facility] ENDOSCOPY;  Service:  Gastroenterology;  Laterality: N/A;   ESOPHAGOGASTRODUODENOSCOPY (EGD) WITH PROPOFOL N/A 07/25/2017   Procedure: ESOPHAGOGASTRODUODENOSCOPY (EGD) WITH PROPOFOL;  Surgeon: Manya Silvas, MD;  Location: [facility] ENDOSCOPY;  Service: Endoscopy;  Laterality: N/A;   KNEE ARTHROSCOPY     LAPAROSCOPIC GASTRIC SLEEVE RESECTION WITH HIATAL HERNIA REPAIR     TONSILLECTOMY     TOTAL HIP ARTHROPLASTY Left 01/26/2016   Procedure: LEFT TOTAL HIP ARTHROPLASTY ANTERIOR APPROACH;  Surgeon: Leandrew Koyanagi, MD;  Location: [place];  Service: Orthopedics;  Laterality: Left;   TRANSFORAMINAL LUMBAR INTERBODY FUSION (TLIF) WITH PEDICLE SCREW FIXATION 3 LEVEL  08/2019   Basil Dess, MD L2-L5    There were no vitals filed for this visit.    Subjective Assessment - 09/28/21 1916     Subjective Pt reports she was seen by dentist prior to PT. She reports pain level is currently ok. The pt reports she has had continued issues with vertigo over the weekend. The pt reports she is very tired since she was been "going since this morning" to multiple appointments.    Pertinent History 51 year old female with history of HTN, migraines, morbid obesity--BMI-41, chronic hypotension, multiple back surgeries with chronic pain with LLE lumbar radiculopathy who was scheduled to  have spinal cord stimulator placed by Dr. Davy Pique on 08/11/2020 but unable to perform surgery due to scar tissue.  History taken from patient and chart review.  Post op on awakening, she had excruciating pain RLE with plegia and and allodynia. She was admitted for work up by Dr. Ronnald Ramp from surgical center on 08/11/2020. Thoracic MRI done revealing subtle increased T2/STIR intensity distal cord at T12-L1 level suspicious for edema/acute spinal cord injury, no CSF leak as well as prior PLIF L2-L5 with residual foraminal protrusion L3/5 potentially irritating right L3 nerve. She was started on IV Dilaudid, gabapentin as well as IV Decadron for management of pain, headaches  and severe neuropathy.  She continues to have pain in RLE but is having some motor return, has constant headache, decreased hearing in right> left ear, constipation with nausea as well as epigastric pain and weakness. Therapy ongoing and CIR recommended due to functional decline.  Of note, she reports 50 lbs weigh loss in the past 3-4 months--due to issues with N/V/intake. Has had two falls last month---no recall of incident leading to fall question due to syncope. Was in process of work up for renal disease. She has had issue with LLE weakness with pain for the past year and being followed by Dr. Ernestina Patches. Did well with stimulator trial.    Limitations Standing;Walking;House hold activities    How long can you sit comfortably? 1 hour (no recent change)    How long can you stand comfortably? 3-4 min    How long can you walk comfortably? 1/2 block at maximum    Patient Stated Goals Improve strength and limit pain    Currently in Pain? No/denies    Pain Onset 1 to 4 weeks ago              INTERVENTIONS -     2.5lb ankle weights donned: -March 2x10 each LE, very challenging for RLE  -LAQ 2x10 each LE, significant RLE trembling  -heel raise 2x10 -toe raise 2x10   Seated, all exercises performed bilaterally  Adduction with ball 2x20 each LE cuing for hold 1-3 sec,  Hamstring curl with RTB 12x, 6x R LE and 2x20 LLE; Pt rates as very challenging on RLE, and medium on LLE  Abduction/ER with RTB 2x12; pt rates as medium  Alternating UE reach with contralat LE march 2x10; cuing for upright posture in chair  RTB UE Row x 15 B   Pt continued to require occasional rest breaks due fatigue.  order to prevent excessive fatigue.   Pt was assisted to valet parking by PT     Pt educated throughout session about proper posture and technique with exercises. Improved exercise technique, movement at target joints, use of target muscles after min to mod verbal, visual, tactile cues.    PT Education -  09/28/21 1918     Education Details exercise technique, body mechanics    Person(s) Educated Patient    Methods Explanation;Demonstration;Verbal cues    Comprehension Verbalized understanding;Returned demonstration              PT Short Term Goals - 09/07/21 1717       PT SHORT TERM GOAL #1   Title Pt will be independent with HEP in order to improve strength and balance in order to decrease fall risk and improve function at home and work.    Baseline Pt not compliant due to holidays, etc    Time 3    Period Weeks    Status  New    Target Date 09/28/21               PT Long Term Goals - 09/07/21 1719       PT LONG TERM GOAL #1   Title Patient will complete five times sit to stand test in < 20 seconds indicating an increased LE strength, power, and improved balance.    Baseline 36.86 sec 09/07/21    Time 12    Period Weeks    Status New    Target Date 11/30/21      PT LONG TERM GOAL #2   Title Pt will decrease TUG to below 14 seconds/decrease in order to demonstrate decreased fall risk.    Baseline TUG 22.99sec    Time 12    Period Weeks    Status New    Target Date 11/30/21      PT LONG TERM GOAL #3   Title Pt will decrease DHI score by at least 18 points in order to demonstrate clinically significant reduction in disability    Baseline 54 on 09/07/21   Not reassessed, but reports no significant change/improvement with dizziness   Time 12    Period Weeks    Status New      PT LONG TERM GOAL #4   Title Patient will increase 10 meter walk test to >1.45ms as to improve gait speed for better community ambulation and to reduce fall risk.    Baseline .41 on 09/07/21 with SPC    Time 12    Period Weeks    Status Partially Met      PT LONG TERM GOAL #5   Title Patient will increase FOTO score to equal to or greater than 57 to demonstrate statistically significant improvement in mobility and quality of life.    Baseline 45 on 09/07/21    Time 12    Period Weeks     Status New      PT LONG TERM GOAL #6   Title Patient will increase six minute walk test distance to >1000 for progression to community ambulator and improve gait ability    Baseline Not assessed visit 1    Time 12    Period Weeks    Status New    Target Date 11/30/21      PT LONG TERM GOAL #7   Title Patient will ascend/descend 2- 3 steps with 1 rail and use of SPC with modified [place] in front of house to safely enter/exit home.    Baseline Pt required cues, increased time and CGA for safety on standard height steps at IE, unable to complete front steps at home at this time    Time 12    Period Weeks    Status New    Target Date 11/30/21                   Plan - 09/28/21 1921     Clinical Impression Statement Pt highly motivated to participate in session. The pt exhibited increased activity tolerance by performing more sets/reps of strengthening interventions. While pt showed progress, the pt still required rest breaks throughout due to fatigue and felt too limited by fatigue to perform standing interventions or nustep endurance intervention. The pt will benefit from further skilled PT to increase LE strength, endurance, mobility and balance to increase QOL and ease with ADLs.    Personal Factors and Comorbidities Comorbidity 3+;Time since onset of injury/illness/exacerbation    Comorbidities HYPOtension, paraparesis, SCI without  spinal bone injury, DDD, cervical cancer, acute kidney injury, GERD, asthma, bradycardia, L total hip replacement, hypokalemia    Examination-Activity Limitations Squat;Lift;Stairs;Bend;Stand;Reach Overhead;Carry;Transfers    Examination-Participation Restrictions Cleaning;Laundry;Meal Prep;Community Activity;Driving;Occupation    Stability/Clinical Decision Making Evolving/Moderate complexity    Rehab Potential Fair    PT Frequency Other (comment)   1x/every other week   PT Duration 12 weeks    PT Treatment/Interventions ADLs/Self Care Home  Management;Aquatic Therapy;Gait training;Stair training;Functional mobility training;Therapeutic activities;Therapeutic exercise;Balance training;Moist Heat;Manual techniques;Patient/family education;Neuromuscular re-education;Passive range of motion;Energy conservation;Vestibular;Joint Manipulations;Spinal Manipulations    PT Next Visit Plan REview HEP, strenght and stair training as able, endurance    PT Home Exercise Plan Will review HEP from previous therapy on visit 2; no updates    Consulted and Agree with Plan of Care Patient             Patient will benefit from skilled therapeutic intervention in order to improve the following deficits and impairments:  Abnormal gait, Dizziness, Improper body mechanics, Decreased mobility, Cardiopulmonary status limiting activity, Decreased activity tolerance, Decreased endurance, Decreased range of motion, Decreased strength, Decreased balance, Decreased safety awareness, Difficulty walking, Impaired flexibility, Obesity  Visit Diagnosis: Muscle weakness (generalized)  Other lack of coordination  Other abnormalities of gait and mobility     Problem List Patient Active Problem List   Diagnosis Date Noted   Abnormality of gait 04/29/2021   Abnormal sensation in both ears 04/06/2021   Other adverse food reactions, not elsewhere classified, subsequent encounter 04/06/2021   Multiple drug allergies 04/06/2021   Insomnia due to medical condition 02/25/2021   Myofascial muscle pain 02/25/2021   Nerve pain 10/18/2020   Incomplete paraplegia ([place]) 10/18/2020   Orthostatic hypotension 10/18/2020   Chronic migraine without aura without status migrainosus, not intractable    Hypokalemia    Adjustment reaction with anxiety and depression    Spinal cord injury, lumbar, without spinal bone injury, sequela ([place]) 08/18/2020   Paraparesis ([facility])    Post-operative pain    Hypotension    Slow transit constipation    AKI (acute kidney injury) ([place])     Right leg weakness 08/11/2020   DDD (degenerative disc disease), lumbar 09/02/2019    Class: Chronic   Degenerative disc disease, lumbar 09/02/2019   Chest tightness 09/23/2018   Bradycardia 09/23/2018   Labile blood pressure 03/08/2018   Chronic low back pain 09/03/2017   History of total hip replacement, left 01/26/2016   EDEMA 04/28/2008   LEG PAIN, BILATERAL 12/16/2007   CERVICAL CANCER 07/02/2007   MORBID OBESITY 07/02/2007   DEPRESSION 07/02/2007   COMMON MIGRAINE 07/02/2007   Other allergic rhinitis 07/02/2007   ASTHMA 07/02/2007   GERD 07/02/2007   ELEVATED BLOOD PRESSURE WITHOUT DIAGNOSIS OF HYPERTENSION 07/02/2007    Zollie Pee, PT 09/28/2021, 7:24 PM  [place] [facility] MAIN [facility] SERVICES [address] [place], [place], [address] Phone: [phone]   Fax:  [phone]  Name: Vanessa Medina MRN: [id number] Date of Birth: April 04, 1971

## 2021-10-03 ENCOUNTER — Ambulatory Visit: Payer: 59 | Admitting: Physical Therapy

## 2021-10-03 ENCOUNTER — Ambulatory Visit: Payer: 59

## 2021-10-04 ENCOUNTER — Other Ambulatory Visit: Payer: Self-pay

## 2021-10-04 ENCOUNTER — Ambulatory Visit: Payer: 59 | Admitting: Physical Therapy

## 2021-10-04 DIAGNOSIS — R2681 Unsteadiness on feet: Secondary | ICD-10-CM

## 2021-10-04 DIAGNOSIS — R262 Difficulty in walking, not elsewhere classified: Secondary | ICD-10-CM

## 2021-10-04 DIAGNOSIS — M6281 Muscle weakness (generalized): Secondary | ICD-10-CM

## 2021-10-04 DIAGNOSIS — M545 Low back pain, unspecified: Secondary | ICD-10-CM | POA: Diagnosis not present

## 2021-10-04 DIAGNOSIS — R2689 Other abnormalities of gait and mobility: Secondary | ICD-10-CM

## 2021-10-04 NOTE — Therapy (Signed)
[place] MAIN [facility] SERVICES [address] [place], [place], [address] Phone: [phone]   Fax:  [phone]  Physical Therapy Treatment  Patient Details  Name: Vanessa Medina MRN: [id number] Date of Birth: 11-29-1970 No data recorded  Encounter Date: 10/04/2021   PT End of Session - 10/04/21 1116     Visit Number 8    Number of Visits 24    Date for PT Re-Evaluation 11/30/21    Authorization Type [employer] Other    Authorization Time Period 01/24/21-04/18/2021    PT Start Time 1109    PT Stop Time 1144    PT Time Calculation (min) 35 min    Equipment Utilized During Treatment Gait belt    Activity Tolerance Patient tolerated treatment well    Behavior During Therapy WFL for tasks assessed/performed             Past Medical History:  Diagnosis Date   Anemia    Cervical cancer ([place])    Family history of adverse reaction to anesthesia     " my mother takes a long time time wake up"   GERD (gastroesophageal reflux disease)    Headache    migraine   History of dysplastic nevus 01/18/2018   right shoulder, recurrent dysplastic nevus   History of shingles    Hypertension    Migraine    Multiple allergies    Obesity    Osteoarthritis    left hip   PONV (postoperative nausea and vomiting)    Sleep apnea    does not wear CPAP   Stroke ([place]) 08/11/2020   Wears glasses     Past Surgical History:  Procedure Laterality Date   ABDOMINAL HYSTERECTOMY     APPENDECTOMY     BACK SURGERY     BILIOPANCREATIC DIVERSION     with duodenal switch laparoscopic    CHOLECYSTECTOMY     COLONOSCOPY N/A 06/27/2021   Procedure: COLONOSCOPY;  Surgeon: Annamaria Helling, DO;  Location: [facility] ENDOSCOPY;  Service: Gastroenterology;  Laterality: N/A;   DIAGNOSTIC LAPAROSCOPY     LOA   ESOPHAGOGASTRODUODENOSCOPY N/A 06/27/2021   Procedure: ESOPHAGOGASTRODUODENOSCOPY (EGD);  Surgeon: Annamaria Helling, DO;  Location: [facility] ENDOSCOPY;  Service:  Gastroenterology;  Laterality: N/A;   ESOPHAGOGASTRODUODENOSCOPY (EGD) WITH PROPOFOL N/A 07/25/2017   Procedure: ESOPHAGOGASTRODUODENOSCOPY (EGD) WITH PROPOFOL;  Surgeon: Manya Silvas, MD;  Location: [facility] ENDOSCOPY;  Service: Endoscopy;  Laterality: N/A;   KNEE ARTHROSCOPY     LAPAROSCOPIC GASTRIC SLEEVE RESECTION WITH HIATAL HERNIA REPAIR     TONSILLECTOMY     TOTAL HIP ARTHROPLASTY Left 01/26/2016   Procedure: LEFT TOTAL HIP ARTHROPLASTY ANTERIOR APPROACH;  Surgeon: Leandrew Koyanagi, MD;  Location: [place];  Service: Orthopedics;  Laterality: Left;   TRANSFORAMINAL LUMBAR INTERBODY FUSION (TLIF) WITH PEDICLE SCREW FIXATION 3 LEVEL  08/2019   Basil Dess, MD L2-L5    There were no vitals filed for this visit.   Subjective Assessment - 10/04/21 1113     Subjective Pt reports issues with her dental work and having to reschedule.    Pertinent History 51 year old female with history of HTN, migraines, morbid obesity--BMI-41, chronic hypotension, multiple back surgeries with chronic pain with LLE lumbar radiculopathy who was scheduled to have spinal cord stimulator placed by Dr. Davy Pique on 08/11/2020 but unable to perform surgery due to scar tissue.  History taken from patient and chart review.  Post op on awakening, she had excruciating pain RLE with  plegia and and allodynia. She was admitted for work up by Dr. Ronnald Ramp from surgical center on 08/11/2020. Thoracic MRI done revealing subtle increased T2/STIR intensity distal cord at T12-L1 level suspicious for edema/acute spinal cord injury, no CSF leak as well as prior PLIF L2-L5 with residual foraminal protrusion L3/5 potentially irritating right L3 nerve. She was started on IV Dilaudid, gabapentin as well as IV Decadron for management of pain, headaches and severe neuropathy.  She continues to have pain in RLE but is having some motor return, has constant headache, decreased hearing in right> left ear, constipation with nausea as well as epigastric pain  and weakness. Therapy ongoing and CIR recommended due to functional decline.  Of note, she reports 50 lbs weigh loss in the past 3-4 months--due to issues with N/V/intake. Has had two falls last month---no recall of incident leading to fall question due to syncope. Was in process of work up for renal disease. She has had issue with LLE weakness with pain for the past year and being followed by Dr. Ernestina Patches. Did well with stimulator trial.    Limitations Standing;Walking;House hold activities    How long can you sit comfortably? 1 hour (no recent change)    How long can you stand comfortably? 3-4 min    How long can you walk comfortably? 1/2 block at maximum    Patient Stated Goals Improve strength and limit pain    Pain Onset 1 to 4 weeks ago             INTERVENTIONS -Therex:    Nustep level 1 x 6 min @ 60 SPM, assistance with set up and cues for SPM  2.5lb ankle weights donned on L, 1# on R  -March 2x10 each LE, very challenging for RLE  -LAQ 2x10 each LE, significant RLE trembling   2.5# Aw donned bilaterally  -heel raise 2x12 -toe raise 2x15 - knees extended to improve ROM  The following activities were completed in parallel bars or at balance bar  Marching B UE assist x 10 ea Mini squats, B UE assist x 10 - fatigue noted with LE tremulous movements around rep 8    Seated, all exercises performed bilaterally  Adduction with ball 2 x 10  each LE cuing for hold 5 sec,  Abduction/ER with RTB 2x12; pt rates as medium     Pt required occasional rest breaks due fatigue, PT was quick to ask when pt appeared to be fatiguing in order to prevent excessive fatigue.    Treatment provided this session            Pt educated throughout session about proper posture and technique with exercises. Improved exercise technique, movement at target joints, use of target muscles after min to mod verbal, visual, tactile cues.                         PT Education -  10/04/21 1115     Education Details Exercise techniqe, body mechanics    Person(s) Educated Patient    Methods Explanation;Demonstration;Verbal cues    Comprehension Verbalized understanding;Returned demonstration              PT Short Term Goals - 09/07/21 1717       PT SHORT TERM GOAL #1   Title Pt will be independent with HEP in order to improve strength and balance in order to decrease fall risk and improve function at home and work.  Baseline Pt not compliant due to holidays, etc    Time 3    Period Weeks    Status New    Target Date 09/28/21               PT Long Term Goals - 09/07/21 1719       PT LONG TERM GOAL #1   Title Patient will complete five times sit to stand test in < 20 seconds indicating an increased LE strength, power, and improved balance.    Baseline 36.86 sec 09/07/21    Time 12    Period Weeks    Status New    Target Date 11/30/21      PT LONG TERM GOAL #2   Title Pt will decrease TUG to below 14 seconds/decrease in order to demonstrate decreased fall risk.    Baseline TUG 22.99sec    Time 12    Period Weeks    Status New    Target Date 11/30/21      PT LONG TERM GOAL #3   Title Pt will decrease DHI score by at least 18 points in order to demonstrate clinically significant reduction in disability    Baseline 54 on 09/07/21   Not reassessed, but reports no significant change/improvement with dizziness   Time 12    Period Weeks    Status New      PT LONG TERM GOAL #4   Title Patient will increase 10 meter walk test to >1.54ms as to improve gait speed for better community ambulation and to reduce fall risk.    Baseline .41 on 09/07/21 with SPC    Time 12    Period Weeks    Status Partially Met      PT LONG TERM GOAL #5   Title Patient will increase FOTO score to equal to or greater than 57 to demonstrate statistically significant improvement in mobility and quality of life.    Baseline 45 on 09/07/21    Time 12    Period Weeks     Status New      PT LONG TERM GOAL #6   Title Patient will increase six minute walk test distance to >1000 for progression to community ambulator and improve gait ability    Baseline Not assessed visit 1    Time 12    Period Weeks    Status New    Target Date 11/30/21      PT LONG TERM GOAL #7   Title Patient will ascend/descend 2- 3 steps with 1 rail and use of SPC with modified [place] in front of house to safely enter/exit home.    Baseline Pt required cues, increased time and CGA for safety on standard height steps at IE, unable to complete front steps at home at this time    Time 12    Period Weeks    Status New    Target Date 11/30/21                   Plan - 10/04/21 1116     Clinical Impression Statement Pt highly motivated to participate in session. The pt exhibited increased activity tolerance by performing more sets/reps of strengthening interventions in addition to improved tolerance for conditioning on nustep. Pt able to tolerate increased reps without s/s of R LE tremblining this session with aactivities involving the quad musculature. Pt also able to tolerate several standing strengthening interventions well this session. The pt will benefit from further skilled PT to  increase LE strength, endurance, mobility and balance to increase QOL and ease with ADLs.    Personal Factors and Comorbidities Comorbidity 3+;Time since onset of injury/illness/exacerbation    Comorbidities HYPOtension, paraparesis, SCI without spinal bone injury, DDD, cervical cancer, acute kidney injury, GERD, asthma, bradycardia, L total hip replacement, hypokalemia    Examination-Activity Limitations Squat;Lift;Stairs;Bend;Stand;Reach Overhead;Carry;Transfers    Examination-Participation Restrictions Cleaning;Laundry;Meal Prep;Community Activity;Driving;Occupation    Stability/Clinical Decision Making Evolving/Moderate complexity    Rehab Potential Fair    PT Frequency Other (comment)    1x/every other week   PT Duration 12 weeks    PT Treatment/Interventions ADLs/Self Care Home Management;Aquatic Therapy;Gait training;Stair training;Functional mobility training;Therapeutic activities;Therapeutic exercise;Balance training;Moist Heat;Manual techniques;Patient/family education;Neuromuscular re-education;Passive range of motion;Energy conservation;Vestibular;Joint Manipulations;Spinal Manipulations    PT Next Visit Plan REview HEP, strenght and stair training as able, endurance    PT Home Exercise Plan Will review HEP from previous therapy on visit 2; no updates    Consulted and Agree with Plan of Care Patient             Patient will benefit from skilled therapeutic intervention in order to improve the following deficits and impairments:  Abnormal gait, Dizziness, Improper body mechanics, Decreased mobility, Cardiopulmonary status limiting activity, Decreased activity tolerance, Decreased endurance, Decreased range of motion, Decreased strength, Decreased balance, Decreased safety awareness, Difficulty walking, Impaired flexibility, Obesity  Visit Diagnosis: Muscle weakness (generalized)  Other abnormalities of gait and mobility  Unsteadiness on feet  Difficulty in walking, not elsewhere classified     Problem List Patient Active Problem List   Diagnosis Date Noted   Abnormality of gait 04/29/2021   Abnormal sensation in both ears 04/06/2021   Other adverse food reactions, not elsewhere classified, subsequent encounter 04/06/2021   Multiple drug allergies 04/06/2021   Insomnia due to medical condition 02/25/2021   Myofascial muscle pain 02/25/2021   Nerve pain 10/18/2020   Incomplete paraplegia ([place]) 10/18/2020   Orthostatic hypotension 10/18/2020   Chronic migraine without aura without status migrainosus, not intractable    Hypokalemia    Adjustment reaction with anxiety and depression    Spinal cord injury, lumbar, without spinal bone injury, sequela ([place])  08/18/2020   Paraparesis ([facility])    Post-operative pain    Hypotension    Slow transit constipation    AKI (acute kidney injury) ([place])    Right leg weakness 08/11/2020   DDD (degenerative disc disease), lumbar 09/02/2019    Class: Chronic   Degenerative disc disease, lumbar 09/02/2019   Chest tightness 09/23/2018   Bradycardia 09/23/2018   Labile blood pressure 03/08/2018   Chronic low back pain 09/03/2017   History of total hip replacement, left 01/26/2016   EDEMA 04/28/2008   LEG PAIN, BILATERAL 12/16/2007   CERVICAL CANCER 07/02/2007   MORBID OBESITY 07/02/2007   DEPRESSION 07/02/2007   COMMON MIGRAINE 07/02/2007   Other allergic rhinitis 07/02/2007   ASTHMA 07/02/2007   GERD 07/02/2007   ELEVATED BLOOD PRESSURE WITHOUT DIAGNOSIS OF HYPERTENSION 07/02/2007    Particia Lather, PT 10/04/2021, 1:16 PM  [place] [facility] MAIN [facility] SERVICES [address] [place], [place], [address] Phone: [phone]   Fax:  [phone]  Name: Vanessa Medina MRN: [id number] Date of Birth: 06/08/1971

## 2021-10-05 ENCOUNTER — Ambulatory Visit: Payer: 59 | Admitting: Physical Therapy

## 2021-10-05 ENCOUNTER — Ambulatory Visit: Payer: 59

## 2021-10-06 ENCOUNTER — Ambulatory Visit: Payer: 59 | Attending: Physical Medicine and Rehabilitation

## 2021-10-06 ENCOUNTER — Other Ambulatory Visit: Payer: Self-pay

## 2021-10-06 DIAGNOSIS — R262 Difficulty in walking, not elsewhere classified: Secondary | ICD-10-CM | POA: Insufficient documentation

## 2021-10-06 DIAGNOSIS — M6281 Muscle weakness (generalized): Secondary | ICD-10-CM | POA: Insufficient documentation

## 2021-10-06 DIAGNOSIS — R2681 Unsteadiness on feet: Secondary | ICD-10-CM | POA: Insufficient documentation

## 2021-10-06 DIAGNOSIS — R2689 Other abnormalities of gait and mobility: Secondary | ICD-10-CM | POA: Insufficient documentation

## 2021-10-06 NOTE — Progress Notes (Signed)
No significant arrhythmias.  Patient's symptoms correlated with normal rhythm and PACs, which are benign extra heartbeats. No changes recommended at this time.

## 2021-10-06 NOTE — Progress Notes (Signed)
Called patient, NA, LMAM

## 2021-10-06 NOTE — Therapy (Signed)
[place] MAIN [facility] SERVICES [address] [place], [place], [address] Phone: [phone]   Fax:  [phone]  Physical Therapy Treatment  Patient Details  Name: Vanessa Medina MRN: [id number] Date of Birth: 11/22/1970 No data recorded  Encounter Date: 10/06/2021   PT End of Session - 10/06/21 1446     Visit Number 9    Number of Visits 24    Date for PT Re-Evaluation 11/30/21    Authorization Type [employer] Other    Authorization Time Period 01/24/21-04/18/2021    PT Start Time 1433    PT Stop Time 1506    PT Time Calculation (min) 33 min    Equipment Utilized During Treatment Gait belt    Activity Tolerance Patient tolerated treatment well    Behavior During Therapy WFL for tasks assessed/performed             Past Medical History:  Diagnosis Date   Anemia    Cervical cancer ([place])    Family history of adverse reaction to anesthesia     " my mother takes a long time time wake up"   GERD (gastroesophageal reflux disease)    Headache    migraine   History of dysplastic nevus 01/18/2018   right shoulder, recurrent dysplastic nevus   History of shingles    Hypertension    Migraine    Multiple allergies    Obesity    Osteoarthritis    left hip   PONV (postoperative nausea and vomiting)    Sleep apnea    does not wear CPAP   Stroke ([place]) 08/11/2020   Wears glasses     Past Surgical History:  Procedure Laterality Date   ABDOMINAL HYSTERECTOMY     APPENDECTOMY     BACK SURGERY     BILIOPANCREATIC DIVERSION     with duodenal switch laparoscopic    CHOLECYSTECTOMY     COLONOSCOPY N/A 06/27/2021   Procedure: COLONOSCOPY;  Surgeon: Annamaria Helling, DO;  Location: [facility] ENDOSCOPY;  Service: Gastroenterology;  Laterality: N/A;   DIAGNOSTIC LAPAROSCOPY     LOA   ESOPHAGOGASTRODUODENOSCOPY N/A 06/27/2021   Procedure: ESOPHAGOGASTRODUODENOSCOPY (EGD);  Surgeon: Annamaria Helling, DO;  Location: [facility] ENDOSCOPY;  Service:  Gastroenterology;  Laterality: N/A;   ESOPHAGOGASTRODUODENOSCOPY (EGD) WITH PROPOFOL N/A 07/25/2017   Procedure: ESOPHAGOGASTRODUODENOSCOPY (EGD) WITH PROPOFOL;  Surgeon: Manya Silvas, MD;  Location: [facility] ENDOSCOPY;  Service: Endoscopy;  Laterality: N/A;   KNEE ARTHROSCOPY     LAPAROSCOPIC GASTRIC SLEEVE RESECTION WITH HIATAL HERNIA REPAIR     TONSILLECTOMY     TOTAL HIP ARTHROPLASTY Left 01/26/2016   Procedure: LEFT TOTAL HIP ARTHROPLASTY ANTERIOR APPROACH;  Surgeon: Leandrew Koyanagi, MD;  Location: [place];  Service: Orthopedics;  Laterality: Left;   TRANSFORAMINAL LUMBAR INTERBODY FUSION (TLIF) WITH PEDICLE SCREW FIXATION 3 LEVEL  08/2019   Basil Dess, MD L2-L5    There were no vitals filed for this visit.   Subjective Assessment - 10/06/21 1444     Subjective Patient reports that she was seen by Dr. Patrice Paradise for a 2nd opinion on her back and was told she would be a candidate for another surgery for her low back. She was excited and stated the MD was leaving the time of surgery up to her.    Pertinent History 51 year old female with history of HTN, migraines, morbid obesity--BMI-41, chronic hypotension, multiple back surgeries with chronic pain with LLE lumbar radiculopathy who was scheduled to have spinal  cord stimulator placed by Dr. Davy Pique on 08/11/2020 but unable to perform surgery due to scar tissue.  History taken from patient and chart review.  Post op on awakening, she had excruciating pain RLE with plegia and and allodynia. She was admitted for work up by Dr. Ronnald Ramp from surgical center on 08/11/2020. Thoracic MRI done revealing subtle increased T2/STIR intensity distal cord at T12-L1 level suspicious for edema/acute spinal cord injury, no CSF leak as well as prior PLIF L2-L5 with residual foraminal protrusion L3/5 potentially irritating right L3 nerve. She was started on IV Dilaudid, gabapentin as well as IV Decadron for management of pain, headaches and severe neuropathy.  She continues to  have pain in RLE but is having some motor return, has constant headache, decreased hearing in right> left ear, constipation with nausea as well as epigastric pain and weakness. Therapy ongoing and CIR recommended due to functional decline.  Of note, she reports 50 lbs weigh loss in the past 3-4 months--due to issues with N/V/intake. Has had two falls last month---no recall of incident leading to fall question due to syncope. Was in process of work up for renal disease. She has had issue with LLE weakness with pain for the past year and being followed by Dr. Ernestina Patches. Did well with stimulator trial.    Limitations Standing;Walking;House hold activities    How long can you sit comfortably? 1 hour (no recent change)    How long can you stand comfortably? 3-4 min    How long can you walk comfortably? 1/2 block at maximum    Patient Stated Goals Improve strength and limit pain    Currently in Pain? Yes    Pain Score 7     Pain Location Back    Pain Orientation Mid;Lower    Pain Descriptors / Indicators Aching    Pain Type Chronic pain    Pain Radiating Towards Rest    Pain Onset 1 to 4 weeks ago    Pain Frequency Intermittent               Interventions:  Nustep L0 LE only x 5 min- VC to keep SPM 60-80- Patient reported fatigue but no pain and exhibited no difficulty.   Scap retraction: Matrix cable system- 2.5 lb 2 sets of 12 reps - VC for correct technique.    hamstring curl: 2.5 lb matrix cable system- 2 sets of 12 reps. Increased difficulty performing with right LE vs. Left.  *Patient reported fatigue but requested  to try walking today.   Gait for endurance:   Patient ambulated 3 laps in gym/hallway for total of 450 feet in 6 min 23 sec. Patient was able to exhibit short reciprocal steps and using SPC with CGA- limited today by fatigue only.                            PT Education - 10/06/21 1445     Education Details Exercise technique, Body mechanics     Person(s) Educated Patient    Methods Explanation;Demonstration;Tactile cues;Verbal cues    Comprehension Verbalized understanding;Verbal cues required;Tactile cues required;Returned demonstration;Need further instruction              PT Short Term Goals - 09/07/21 1717       PT SHORT TERM GOAL #1   Title Pt will be independent with HEP in order to improve strength and balance in order to decrease fall risk and improve function at  home and work.    Baseline Pt not compliant due to holidays, etc    Time 3    Period Weeks    Status New    Target Date 09/28/21               PT Long Term Goals - 09/07/21 1719       PT LONG TERM GOAL #1   Title Patient will complete five times sit to stand test in < 20 seconds indicating an increased LE strength, power, and improved balance.    Baseline 36.86 sec 09/07/21    Time 12    Period Weeks    Status New    Target Date 11/30/21      PT LONG TERM GOAL #2   Title Pt will decrease TUG to below 14 seconds/decrease in order to demonstrate decreased fall risk.    Baseline TUG 22.99sec    Time 12    Period Weeks    Status New    Target Date 11/30/21      PT LONG TERM GOAL #3   Title Pt will decrease DHI score by at least 18 points in order to demonstrate clinically significant reduction in disability    Baseline 54 on 09/07/21   Not reassessed, but reports no significant change/improvement with dizziness   Time 12    Period Weeks    Status New      PT LONG TERM GOAL #4   Title Patient will increase 10 meter walk test to >1.47ms as to improve gait speed for better community ambulation and to reduce fall risk.    Baseline .41 on 09/07/21 with SPC    Time 12    Period Weeks    Status Partially Met      PT LONG TERM GOAL #5   Title Patient will increase FOTO score to equal to or greater than 57 to demonstrate statistically significant improvement in mobility and quality of life.    Baseline 45 on 09/07/21    Time 12    Period Weeks     Status New      PT LONG TERM GOAL #6   Title Patient will increase six minute walk test distance to >1000 for progression to community ambulator and improve gait ability    Baseline Not assessed visit 1    Time 12    Period Weeks    Status New    Target Date 11/30/21      PT LONG TERM GOAL #7   Title Patient will ascend/descend 2- 3 steps with 1 rail and use of SPC with modified [place] in front of house to safely enter/exit home.    Baseline Pt required cues, increased time and CGA for safety on standard height steps at IE, unable to complete front steps at home at this time    Time 12    Period Weeks    Status New    Target Date 11/30/21                   Plan - 10/06/21 1446     Clinical Impression Statement Patient performed well and presented with good motivation and excited about news from her MD about another possible surgery for her back. She was challenged with resistive postural and LE strengthening specifically with right LE but improved on 2nd sets and presented with fatigue as limiting factor. The pt will benefit from further skilled PT to increase LE strength, endurance, mobility and balance to  increase QOL and ease with ADLs.    Personal Factors and Comorbidities Comorbidity 3+;Time since onset of injury/illness/exacerbation    Comorbidities HYPOtension, paraparesis, SCI without spinal bone injury, DDD, cervical cancer, acute kidney injury, GERD, asthma, bradycardia, L total hip replacement, hypokalemia    Examination-Activity Limitations Squat;Lift;Stairs;Bend;Stand;Reach Overhead;Carry;Transfers    Examination-Participation Restrictions Cleaning;Laundry;Meal Prep;Community Activity;Driving;Occupation    Stability/Clinical Decision Making Evolving/Moderate complexity    Rehab Potential Fair    PT Frequency Other (comment)   1x/every other week   PT Duration 12 weeks    PT Treatment/Interventions ADLs/Self Care Home Management;Aquatic Therapy;Gait  training;Stair training;Functional mobility training;Therapeutic activities;Therapeutic exercise;Balance training;Moist Heat;Manual techniques;Patient/family education;Neuromuscular re-education;Passive range of motion;Energy conservation;Vestibular;Joint Manipulations;Spinal Manipulations    PT Next Visit Plan REview HEP, strenght and stair training as able, endurance    PT Home Exercise Plan Will review HEP from previous therapy on visit 2; no updates    Consulted and Agree with Plan of Care Patient             Patient will benefit from skilled therapeutic intervention in order to improve the following deficits and impairments:  Abnormal gait, Dizziness, Improper body mechanics, Decreased mobility, Cardiopulmonary status limiting activity, Decreased activity tolerance, Decreased endurance, Decreased range of motion, Decreased strength, Decreased balance, Decreased safety awareness, Difficulty walking, Impaired flexibility, Obesity  Visit Diagnosis: Muscle weakness (generalized)  Difficulty in walking, not elsewhere classified  Unsteadiness on feet     Problem List Patient Active Problem List   Diagnosis Date Noted   Abnormality of gait 04/29/2021   Abnormal sensation in both ears 04/06/2021   Other adverse food reactions, not elsewhere classified, subsequent encounter 04/06/2021   Multiple drug allergies 04/06/2021   Insomnia due to medical condition 02/25/2021   Myofascial muscle pain 02/25/2021   Nerve pain 10/18/2020   Incomplete paraplegia ([place]) 10/18/2020   Orthostatic hypotension 10/18/2020   Chronic migraine without aura without status migrainosus, not intractable    Hypokalemia    Adjustment reaction with anxiety and depression    Spinal cord injury, lumbar, without spinal bone injury, sequela ([place]) 08/18/2020   Paraparesis ([place])    Post-operative pain    Hypotension    Slow transit constipation    AKI (acute kidney injury) ([place])    Right leg weakness 08/11/2020    DDD (degenerative disc disease), lumbar 09/02/2019    Class: Chronic   Degenerative disc disease, lumbar 09/02/2019   Chest tightness 09/23/2018   Bradycardia 09/23/2018   Labile blood pressure 03/08/2018   Chronic low back pain 09/03/2017   History of total hip replacement, left 01/26/2016   EDEMA 04/28/2008   LEG PAIN, BILATERAL 12/16/2007   CERVICAL CANCER 07/02/2007   MORBID OBESITY 07/02/2007   DEPRESSION 07/02/2007   COMMON MIGRAINE 07/02/2007   Other allergic rhinitis 07/02/2007   ASTHMA 07/02/2007   GERD 07/02/2007   ELEVATED BLOOD PRESSURE WITHOUT DIAGNOSIS OF HYPERTENSION 07/02/2007    Lewis Moccasin, PT 10/06/2021, 3:49 PM  [facility] MAIN [facility] SERVICES [address] [place], [place], [address] Phone: [phone]   Fax:  [phone]  Name: Vanessa Medina MRN: [id number] Date of Birth: 03/17/1971

## 2021-10-10 ENCOUNTER — Ambulatory Visit: Payer: 59

## 2021-10-12 ENCOUNTER — Ambulatory Visit: Payer: 59 | Admitting: Allergy

## 2021-10-12 ENCOUNTER — Ambulatory Visit: Payer: 59

## 2021-10-24 ENCOUNTER — Other Ambulatory Visit: Payer: Self-pay | Admitting: Specialist

## 2021-10-26 ENCOUNTER — Encounter: Payer: Self-pay | Admitting: Physical Therapy

## 2021-10-26 ENCOUNTER — Ambulatory Visit: Payer: 59

## 2021-10-26 ENCOUNTER — Ambulatory Visit: Payer: 59 | Admitting: Physical Therapy

## 2021-10-26 ENCOUNTER — Other Ambulatory Visit: Payer: Self-pay

## 2021-10-26 DIAGNOSIS — R2681 Unsteadiness on feet: Secondary | ICD-10-CM

## 2021-10-26 DIAGNOSIS — R2689 Other abnormalities of gait and mobility: Secondary | ICD-10-CM

## 2021-10-26 DIAGNOSIS — M6281 Muscle weakness (generalized): Secondary | ICD-10-CM | POA: Diagnosis not present

## 2021-10-26 DIAGNOSIS — R262 Difficulty in walking, not elsewhere classified: Secondary | ICD-10-CM

## 2021-10-26 NOTE — Therapy (Signed)
[place] [facility] SERVICES [address] [place], [place], [address] Phone: [phone]   Fax:  [phone]  Physical Therapy Treatment/Physical Therapy Progress Note   Dates of reporting period  09/07/21   to   10/26/21   Patient Details  Name: Vanessa Medina MRN: [id number] Date of Birth: November 07, 1970 No data recorded  Encounter Date: 10/26/2021   PT End of Session - 10/26/21 1611     Visit Number 10    Number of Visits 24    Date for PT Re-Evaluation 11/30/21    Authorization Type [employer] Other    Authorization Time Period 01/24/21-04/18/2021    PT Start Time 1608    PT Stop Time 1647    PT Time Calculation (min) 39 min    Equipment Utilized During Treatment Gait belt    Activity Tolerance Patient tolerated treatment well    Behavior During Therapy WFL for tasks assessed/performed             Past Medical History:  Diagnosis Date   Anemia    Cervical cancer ([place])    Family history of adverse reaction to anesthesia     " my mother takes a long time time wake up"   GERD (gastroesophageal reflux disease)    Headache    migraine   History of dysplastic nevus 01/18/2018   right shoulder, recurrent dysplastic nevus   History of shingles    Hypertension    Migraine    Multiple allergies    Obesity    Osteoarthritis    left hip   PONV (postoperative nausea and vomiting)    Sleep apnea    does not wear CPAP   Stroke ([place]) 08/11/2020   Wears glasses     Past Surgical History:  Procedure Laterality Date   ABDOMINAL HYSTERECTOMY     APPENDECTOMY     BACK SURGERY     BILIOPANCREATIC DIVERSION     with duodenal switch laparoscopic    CHOLECYSTECTOMY     COLONOSCOPY N/A 06/27/2021   Procedure: COLONOSCOPY;  Surgeon: Annamaria Helling, DO;  Location: [facility] ENDOSCOPY;  Service: Gastroenterology;  Laterality: N/A;   DIAGNOSTIC LAPAROSCOPY     LOA   ESOPHAGOGASTRODUODENOSCOPY N/A 06/27/2021   Procedure:  ESOPHAGOGASTRODUODENOSCOPY (EGD);  Surgeon: Annamaria Helling, DO;  Location: [facility] ENDOSCOPY;  Service: Gastroenterology;  Laterality: N/A;   ESOPHAGOGASTRODUODENOSCOPY (EGD) WITH PROPOFOL N/A 07/25/2017   Procedure: ESOPHAGOGASTRODUODENOSCOPY (EGD) WITH PROPOFOL;  Surgeon: Manya Silvas, MD;  Location: [facility] ENDOSCOPY;  Service: Endoscopy;  Laterality: N/A;   KNEE ARTHROSCOPY     LAPAROSCOPIC GASTRIC SLEEVE RESECTION WITH HIATAL HERNIA REPAIR     TONSILLECTOMY     TOTAL HIP ARTHROPLASTY Left 01/26/2016   Procedure: LEFT TOTAL HIP ARTHROPLASTY ANTERIOR APPROACH;  Surgeon: Leandrew Koyanagi, MD;  Location: [place];  Service: Orthopedics;  Laterality: Left;   TRANSFORAMINAL LUMBAR INTERBODY FUSION (TLIF) WITH PEDICLE SCREW FIXATION 3 LEVEL  08/2019   Basil Dess, MD L2-L5    There were no vitals filed for this visit.   Subjective Assessment - 10/26/21 1607     Subjective Pt reports she s postponing her surgery forher back due to her father being diagnoses with cancer and requiring treatmetn s for this. Pt reports recent incidents of increased knee buckling. Pt also reports she is not completing stairs in front of her house but she has been able to copmlete them safely since initial eval (1-2 times).    Pertinent History  51 year old female with history of HTN, migraines, morbid obesity--BMI-41, chronic hypotension, multiple back surgeries with chronic pain with LLE lumbar radiculopathy who was scheduled to have spinal cord stimulator placed by Dr. Davy Pique on 08/11/2020 but unable to perform surgery due to scar tissue.  History taken from patient and chart review.  Post op on awakening, she had excruciating pain RLE with plegia and and allodynia. She was admitted for work up by Dr. Ronnald Ramp from surgical center on 08/11/2020. Thoracic MRI done revealing subtle increased T2/STIR intensity distal cord at T12-L1 level suspicious for edema/acute spinal cord injury, no CSF leak as well as prior PLIF L2-L5 with  residual foraminal protrusion L3/5 potentially irritating right L3 nerve. She was started on IV Dilaudid, gabapentin as well as IV Decadron for management of pain, headaches and severe neuropathy.  She continues to have pain in RLE but is having some motor return, has constant headache, decreased hearing in right> left ear, constipation with nausea as well as epigastric pain and weakness. Therapy ongoing and CIR recommended due to functional decline.  Of note, she reports 50 lbs weigh loss in the past 3-4 months--due to issues with N/V/intake. Has had two falls last month---no recall of incident leading to fall question due to syncope. Was in process of work up for renal disease. She has had issue with LLE weakness with pain for the past year and being followed by Dr. Ernestina Patches. Did well with stimulator trial.    Limitations Standing;Walking;House hold activities    How long can you sit comfortably? 1 hour (no recent change)    How long can you stand comfortably? 3-4 min    How long can you walk comfortably? 1/2 block at maximum    Patient Stated Goals Improve strength and limit pain    Pain Onset 1 to 4 weeks ago                Physical therapy treatment session today consisted of completing assessment of goals and administration of testing as demonstrated in flow sheet and in goals section of this exam . Addition treatments may be found below.   Exercise/Activity Sets/Reps/Time/ Resistance Assistance Charge type Comments- Unless otherwise stated, CGA was provided and gait belt donned in order to ensure pt safety   Nustep [place] LE  Level 1 x 6 min   therex Assistance with set up and maintaing cadence. Moderate fatigue noted at end of session.   6MWT 450 feet Tri point cane therex Unable to complete at eval  5XSTS 26.6 sec  therex Improved by 10 seconds from eval UE assist with each rep   TUG 21 sec  therex Improved by 2 sec from eval, used UE for STS portion and tri point cane for ambulation    FOFO: 41      DHI: 72    Pt reports her dizziness s/s fluctuate depending on the day/ week                                 Treatment Provided this session   Pt educated throughout session about proper posture and technique with exercises. Improved exercise technique, movement at target joints, use of target muscles after min to mod verbal, visual, tactile cues. Note: Portions of this document were prepared using Dragon voice recognition software and although reviewed may contain unintentional dictation errors in syntax, grammar, or spelling.  PT Education - 10/26/21 1659     Education Details Therapeutic progress    Person(s) Educated Patient    Methods Explanation    Comprehension Verbalized understanding              PT Short Term Goals - 10/26/21 1612       PT SHORT TERM GOAL #1   Title Pt will be independent with HEP in order to improve strength and balance in order to decrease fall risk and improve function at home and work.    Baseline Pt performing HEP daily at this time and trying to walk in hallway throughout the day    Time 3    Period Weeks    Status Achieved    Target Date 09/28/21               PT Long Term Goals - 10/26/21 1622       PT LONG TERM GOAL #1   Title Patient will complete five times sit to stand test in < 20 seconds indicating an increased LE strength, power, and improved balance.    Baseline 36.86 sec 09/07/21 10/26/21: 26.6    Time 12    Period Weeks    Status New    Target Date 11/30/21      PT LONG TERM GOAL #2   Title Pt will decrease TUG to below 14 seconds/decrease in order to demonstrate decreased fall risk.    Baseline TUG 22.99sec 2/22: 21s    Time 12    Period Weeks    Status On-going    Target Date 11/30/21      PT LONG TERM GOAL #3   Title Pt will decrease DHI score by at least 18 points in order to demonstrate clinically significant reduction in disability    Baseline  54 on 09/07/21 72on 10/26/21   Not reassessed, but reports no significant change/improvement with dizziness   Time 12    Period Weeks    Status New      PT LONG TERM GOAL #4   Title Patient will increase 10 meter walk test to >1.30ms as to improve gait speed for better community ambulation and to reduce fall risk.    Baseline .41 on 09/07/21 with SPC    Time 12    Period Weeks    Status Partially Met      PT LONG TERM GOAL #5   Title Patient will increase FOTO score to equal to or greater than 57 to demonstrate statistically significant improvement in mobility and quality of life.    Baseline 45 on 09/07/21 41 on 10/26/21 ( pt having recent knee buckiling)    Time 12    Period Weeks    Status New      PT LONG TERM GOAL #6   Title Patient will increase six minute walk test distance to >1000 for progression to community ambulator and improve gait ability    Baseline Not assessed visit 1, 450 feet on 10/26/21    Time 12    Period Weeks    Status New    Target Date 11/30/21      PT LONG TERM GOAL #7   Title Patient will ascend/descend 2- 3 steps with 1 rail and use of SPC with modified [place] in front of house to safely enter/exit home.    Baseline Pt required cues, increased time and CGA for safety on standard height steps at IE, unable to complete front steps at home at this  time    Time 20    Period Weeks    Status New    Target Date 11/30/21                   Plan - 10/26/21 1611     Clinical Impression Statement Pt presents to PT for progress note this session.  Patient has made significant progress with all of her functional measures measured in today's session and was also able to complete 6-minute walk test during today's session.  Patient time improved with 10 m walk test is also timed up and go patient ability to complete stairs was improved as well.  Patient is to making progress toward her goals but is not at her functional goals at this time.  Patient's  subjective outcome measures did decrease patient reports she feels these are different on a day-to-day basis based on how she feels.  Patient will continue to benefit from skilled physical therapy interventions focusing on strength, balance, and mobility in order to improve her quality of life and function. Patient's condition has the potential to improve in response to therapy. Maximum improvement is yet to be obtained. The anticipated improvement is attainable and reasonable in a generally predictable time.      Personal Factors and Comorbidities Comorbidity 3+;Time since onset of injury/illness/exacerbation    Comorbidities HYPOtension, paraparesis, SCI without spinal bone injury, DDD, cervical cancer, acute kidney injury, GERD, asthma, bradycardia, L total hip replacement, hypokalemia    Examination-Activity Limitations Squat;Lift;Stairs;Bend;Stand;Reach Overhead;Carry;Transfers    Examination-Participation Restrictions Cleaning;Laundry;Meal Prep;Community Activity;Driving;Occupation    Stability/Clinical Decision Making Evolving/Moderate complexity    Rehab Potential Fair    PT Frequency Other (comment)   1x/every other week   PT Duration 12 weeks    PT Treatment/Interventions ADLs/Self Care Home Management;Aquatic Therapy;Gait training;Stair training;Functional mobility training;Therapeutic activities;Therapeutic exercise;Balance training;Moist Heat;Manual techniques;Patient/family education;Neuromuscular re-education;Passive range of motion;Energy conservation;Vestibular;Joint Manipulations;Spinal Manipulations    PT Next Visit Plan REview HEP, strenght and stair training as able, endurance    PT Home Exercise Plan Will review HEP from previous therapy on visit 2; no updates    Consulted and Agree with Plan of Care Patient             Patient will benefit from skilled therapeutic intervention in order to improve the following deficits and impairments:  Abnormal gait, Dizziness, Improper  body mechanics, Decreased mobility, Cardiopulmonary status limiting activity, Decreased activity tolerance, Decreased endurance, Decreased range of motion, Decreased strength, Decreased balance, Decreased safety awareness, Difficulty walking, Impaired flexibility, Obesity  Visit Diagnosis: Unsteadiness on feet  Other abnormalities of gait and mobility  Difficulty in walking, not elsewhere classified     Problem List Patient Active Problem List   Diagnosis Date Noted   Abnormality of gait 04/29/2021   Abnormal sensation in both ears 04/06/2021   Other adverse food reactions, not elsewhere classified, subsequent encounter 04/06/2021   Multiple drug allergies 04/06/2021   Insomnia due to medical condition 02/25/2021   Myofascial muscle pain 02/25/2021   Nerve pain 10/18/2020   Incomplete paraplegia ([place]) 10/18/2020   Orthostatic hypotension 10/18/2020   Chronic migraine without aura without status migrainosus, not intractable    Hypokalemia    Adjustment reaction with anxiety and depression    Spinal cord injury, lumbar, without spinal bone injury, sequela ([place]) 08/18/2020   Paraparesis ([facility])    Post-operative pain    Hypotension    Slow transit constipation    AKI (acute kidney injury) ([place])    Right  leg weakness 08/11/2020   DDD (degenerative disc disease), lumbar 09/02/2019    Class: Chronic   Degenerative disc disease, lumbar 09/02/2019   Chest tightness 09/23/2018   Bradycardia 09/23/2018   Labile blood pressure 03/08/2018   Chronic low back pain 09/03/2017   History of total hip replacement, left 01/26/2016   EDEMA 04/28/2008   LEG PAIN, BILATERAL 12/16/2007   CERVICAL CANCER 07/02/2007   MORBID OBESITY 07/02/2007   DEPRESSION 07/02/2007   COMMON MIGRAINE 07/02/2007   Other allergic rhinitis 07/02/2007   ASTHMA 07/02/2007   GERD 07/02/2007   ELEVATED BLOOD PRESSURE WITHOUT DIAGNOSIS OF HYPERTENSION 07/02/2007    Particia Lather, PT 10/26/2021, 5:02  PM  [place] [facility] SERVICES [address] [place], [place], [address] Phone: [phone]   Fax:  [phone]  Name: FARRON LAFOND MRN: [id number] Date of Birth: Sep 10, 1970

## 2021-10-31 ENCOUNTER — Other Ambulatory Visit: Payer: Self-pay

## 2021-10-31 ENCOUNTER — Ambulatory Visit: Payer: 59 | Admitting: Physical Therapy

## 2021-10-31 DIAGNOSIS — M6281 Muscle weakness (generalized): Secondary | ICD-10-CM | POA: Diagnosis not present

## 2021-10-31 DIAGNOSIS — R2681 Unsteadiness on feet: Secondary | ICD-10-CM

## 2021-10-31 DIAGNOSIS — R2689 Other abnormalities of gait and mobility: Secondary | ICD-10-CM

## 2021-10-31 DIAGNOSIS — R262 Difficulty in walking, not elsewhere classified: Secondary | ICD-10-CM

## 2021-10-31 NOTE — Therapy (Signed)
[place] MAIN [facility] SERVICES [address] [place], [place], [address] Phone: [phone]   Fax:  [phone]  Physical Therapy Treatment  Patient Details  Name: Vanessa Medina MRN: [id number] Date of Birth: 04/03/1971 No data recorded  Encounter Date: 10/31/2021   PT End of Session - 10/31/21 1608     Visit Number 11    Number of Visits 24    Date for PT Re-Evaluation 11/30/21    Authorization Type U[employer] Other    Authorization Time Period 01/24/21-04/18/2021    PT Start Time 1604    PT Stop Time 1645    PT Time Calculation (min) 41 min    Equipment Utilized During Treatment Gait belt    Activity Tolerance Patient tolerated treatment well    Behavior During Therapy WFL for tasks assessed/performed             Past Medical History:  Diagnosis Date   Anemia    Cervical cancer ([place])    Family history of adverse reaction to anesthesia     " my mother takes a long time time wake up"   GERD (gastroesophageal reflux disease)    Headache    migraine   History of dysplastic nevus 01/18/2018   right shoulder, recurrent dysplastic nevus   History of shingles    Hypertension    Migraine    Multiple allergies    Obesity    Osteoarthritis    left hip   PONV (postoperative nausea and vomiting)    Sleep apnea    does not wear CPAP   Stroke ([place]) 08/11/2020   Wears glasses     Past Surgical History:  Procedure Laterality Date   ABDOMINAL HYSTERECTOMY     APPENDECTOMY     BACK SURGERY     BILIOPANCREATIC DIVERSION     with duodenal switch laparoscopic    CHOLECYSTECTOMY     COLONOSCOPY N/A 06/27/2021   Procedure: COLONOSCOPY;  Surgeon: Annamaria Helling, DO;  Location: [facility] ENDOSCOPY;  Service: Gastroenterology;  Laterality: N/A;   DIAGNOSTIC LAPAROSCOPY     LOA   ESOPHAGOGASTRODUODENOSCOPY N/A 06/27/2021   Procedure: ESOPHAGOGASTRODUODENOSCOPY (EGD);  Surgeon: Annamaria Helling, DO;  Location: [facility] ENDOSCOPY;   Service: Gastroenterology;  Laterality: N/A;   ESOPHAGOGASTRODUODENOSCOPY (EGD) WITH PROPOFOL N/A 07/25/2017   Procedure: ESOPHAGOGASTRODUODENOSCOPY (EGD) WITH PROPOFOL;  Surgeon: Manya Silvas, MD;  Location: [facility] ENDOSCOPY;  Service: Endoscopy;  Laterality: N/A;   KNEE ARTHROSCOPY     LAPAROSCOPIC GASTRIC SLEEVE RESECTION WITH HIATAL HERNIA REPAIR     TONSILLECTOMY     TOTAL HIP ARTHROPLASTY Left 01/26/2016   Procedure: LEFT TOTAL HIP ARTHROPLASTY ANTERIOR APPROACH;  Surgeon: Leandrew Koyanagi, MD;  Location: [place];  Service: Orthopedics;  Laterality: Left;   TRANSFORAMINAL LUMBAR INTERBODY FUSION (TLIF) WITH PEDICLE SCREW FIXATION 3 LEVEL  08/2019   Basil Dess, MD L2-L5    There were no vitals filed for this visit.   Subjective Assessment - 10/31/21 1607     Subjective Pt reports she is doing will wiht no new c/o at this time. Reports her father is starting wiht his cancer treatment next week.    Pertinent History 51 year old female with history of HTN, migraines, morbid obesity--BMI-41, chronic hypotension, multiple back surgeries with chronic pain with LLE lumbar radiculopathy who was scheduled to have spinal cord stimulator placed by Dr. Davy Pique on 08/11/2020 but unable to perform surgery due to scar tissue.  History taken from patient and  chart review.  Post op on awakening, she had excruciating pain RLE with plegia and and allodynia. She was admitted for work up by Dr. Ronnald Ramp from surgical center on 08/11/2020. Thoracic MRI done revealing subtle increased T2/STIR intensity distal cord at T12-L1 level suspicious for edema/acute spinal cord injury, no CSF leak as well as prior PLIF L2-L5 with residual foraminal protrusion L3/5 potentially irritating right L3 nerve. She was started on IV Dilaudid, gabapentin as well as IV Decadron for management of pain, headaches and severe neuropathy.  She continues to have pain in RLE but is having some motor return, has constant headache, decreased hearing in  right> left ear, constipation with nausea as well as epigastric pain and weakness. Therapy ongoing and CIR recommended due to functional decline.  Of note, she reports 50 lbs weigh loss in the past 3-4 months--due to issues with N/V/intake. Has had two falls last month---no recall of incident leading to fall question due to syncope. Was in process of work up for renal disease. She has had issue with LLE weakness with pain for the past year and being followed by Dr. Ernestina Patches. Did well with stimulator trial.    Limitations Standing;Walking;House hold activities    How long can you sit comfortably? 1 hour (no recent change)    How long can you stand comfortably? 3-4 min    How long can you walk comfortably? 1/2 block at maximum    Patient Stated Goals Improve strength and limit pain    Pain Onset 1 to 4 weeks ago             INTERVENTIONS -Therex:    Nustep level 1 x 4:30 min @ 60 SPM, level 2 for 1:30  assistance with set up and cues for SPM  LAQ ( 2 x 10 )  and 160 feet walking ( x 2) interval training.  *Performed circuit style to minimize needs for rest breaks and improve endurance   Marching ( 3# L, 2.5# R) 2 x 10  STS 2 x 5 with B UE assist *Performed circuit style to minimize needs for rest breaks and improve endurance   2.5# Aw donned bilaterally  -heel raise 2x12 -toe raise 2x15 - knees extended to improve ROM *Performed circuit style to minimize needs for rest breaks and improve endurance   The following activities were completed in parallel bars or at balance bar  Sidestepping x 3 laps at balance station with UE support  Mini squats, B UE assist x 10  *Performed circuit style to minimize needs for rest breaks and improve endurance    Pt required occasional rest breaks due fatigue, PT was quick to ask when pt appeared to be fatiguing in order to prevent excessive fatigue.                              PT Education - 10/31/21 1608     Education  Details exercise form and technique    Person(s) Educated Patient    Methods Explanation    Comprehension Verbalized understanding              PT Short Term Goals - 10/26/21 1612       PT SHORT TERM GOAL #1   Title Pt will be independent with HEP in order to improve strength and balance in order to decrease fall risk and improve function at home and work.    Baseline Pt performing HEP daily  at this time and trying to walk in hallway throughout the day    Time 3    Period Weeks    Status Achieved    Target Date 09/28/21               PT Long Term Goals - 10/26/21 1622       PT LONG TERM GOAL #1   Title Patient will complete five times sit to stand test in < 20 seconds indicating an increased LE strength, power, and improved balance.    Baseline 36.86 sec 09/07/21 10/26/21: 26.6    Time 12    Period Weeks    Status New    Target Date 11/30/21      PT LONG TERM GOAL #2   Title Pt will decrease TUG to below 14 seconds/decrease in order to demonstrate decreased fall risk.    Baseline TUG 22.99sec 2/22: 21s    Time 12    Period Weeks    Status On-going    Target Date 11/30/21      PT LONG TERM GOAL #3   Title Pt will decrease DHI score by at least 18 points in order to demonstrate clinically significant reduction in disability    Baseline 54 on 09/07/21 72on 10/26/21   Not reassessed, but reports no significant change/improvement with dizziness   Time 12    Period Weeks    Status New      PT LONG TERM GOAL #4   Title Patient will increase 10 meter walk test to >1.53ms as to improve gait speed for better community ambulation and to reduce fall risk.    Baseline .41 on 09/07/21 with SPC    Time 12    Period Weeks    Status Partially Met      PT LONG TERM GOAL #5   Title Patient will increase FOTO score to equal to or greater than 57 to demonstrate statistically significant improvement in mobility and quality of life.    Baseline 45 on 09/07/21 41 on 10/26/21 ( pt having  recent knee buckiling)    Time 12    Period Weeks    Status New      PT LONG TERM GOAL #6   Title Patient will increase six minute walk test distance to >1000 for progression to community ambulator and improve gait ability    Baseline Not assessed visit 1, 450 feet on 10/26/21    Time 12    Period Weeks    Status New    Target Date 11/30/21      PT LONG TERM GOAL #7   Title Patient will ascend/descend 2- 3 steps with 1 rail and use of SPC with modified [place] in front of house to safely enter/exit home.    Baseline Pt required cues, increased time and CGA for safety on standard height steps at IE, unable to complete front steps at home at this time    Time 12    Period Weeks    Status New    Target Date 11/30/21                   Plan - 10/31/21 1608     Clinical Impression Statement Pt presents with excellent motivation for completion of PT session. Pt continued with LE endurance and strengthening this session.    Personal Factors and Comorbidities Comorbidity 3+;Time since onset of injury/illness/exacerbation    Comorbidities HYPOtension, paraparesis, SCI without spinal bone injury, DDD, cervical cancer, acute  kidney injury, GERD, asthma, bradycardia, L total hip replacement, hypokalemia    Examination-Activity Limitations Squat;Lift;Stairs;Bend;Stand;Reach Overhead;Carry;Transfers    Examination-Participation Restrictions Cleaning;Laundry;Meal Prep;Community Activity;Driving;Occupation    Stability/Clinical Decision Making Evolving/Moderate complexity    Rehab Potential Fair    PT Frequency Other (comment)   1x/every other week   PT Duration 12 weeks    PT Treatment/Interventions ADLs/Self Care Home Management;Aquatic Therapy;Gait training;Stair training;Functional mobility training;Therapeutic activities;Therapeutic exercise;Balance training;Moist Heat;Manual techniques;Patient/family education;Neuromuscular re-education;Passive range of motion;Energy  conservation;Vestibular;Joint Manipulations;Spinal Manipulations    PT Next Visit Plan REview HEP, strenght and stair training as able, endurance    PT Home Exercise Plan Will review HEP from previous therapy on visit 2; no updates    Consulted and Agree with Plan of Care Patient             Patient will benefit from skilled therapeutic intervention in order to improve the following deficits and impairments:  Abnormal gait, Dizziness, Improper body mechanics, Decreased mobility, Cardiopulmonary status limiting activity, Decreased activity tolerance, Decreased endurance, Decreased range of motion, Decreased strength, Decreased balance, Decreased safety awareness, Difficulty walking, Impaired flexibility, Obesity  Visit Diagnosis: Difficulty in walking, not elsewhere classified  Unsteadiness on feet  Other abnormalities of gait and mobility     Problem List Patient Active Problem List   Diagnosis Date Noted   Abnormality of gait 04/29/2021   Abnormal sensation in both ears 04/06/2021   Other adverse food reactions, not elsewhere classified, subsequent encounter 04/06/2021   Multiple drug allergies 04/06/2021   Insomnia due to medical condition 02/25/2021   Myofascial muscle pain 02/25/2021   Nerve pain 10/18/2020   Incomplete paraplegia ([place]) 10/18/2020   Orthostatic hypotension 10/18/2020   Chronic migraine without aura without status migrainosus, not intractable    Hypokalemia    Adjustment reaction with anxiety and depression    Spinal cord injury, lumbar, without spinal bone injury, sequela ([place]) 08/18/2020   Paraparesis ([place])    Post-operative pain    Hypotension    Slow transit constipation    AKI (acute kidney injury) ([place])    Right leg weakness 08/11/2020   DDD (degenerative disc disease), lumbar 09/02/2019    Class: Chronic   Degenerative disc disease, lumbar 09/02/2019   Chest tightness 09/23/2018   Bradycardia 09/23/2018   Labile blood pressure 03/08/2018    Chronic low back pain 09/03/2017   History of total hip replacement, left 01/26/2016   EDEMA 04/28/2008   LEG PAIN, BILATERAL 12/16/2007   CERVICAL CANCER 07/02/2007   MORBID OBESITY 07/02/2007   DEPRESSION 07/02/2007   COMMON MIGRAINE 07/02/2007   Other allergic rhinitis 07/02/2007   ASTHMA 07/02/2007   GERD 07/02/2007   ELEVATED BLOOD PRESSURE WITHOUT DIAGNOSIS OF HYPERTENSION 07/02/2007    Particia Lather, PT 10/31/2021, 4:10 PM  [place] [facility] MAIN [facility] SERVICES [address] [place], [place], [address] Phone: [phone]   Fax:  [phone]  Name: Vanessa Medina MRN: [id number] Date of Birth: 01-04-1971

## 2021-11-02 ENCOUNTER — Encounter: Payer: 59 | Attending: Physical Medicine and Rehabilitation | Admitting: Physical Medicine and Rehabilitation

## 2021-11-02 ENCOUNTER — Encounter: Payer: Self-pay | Admitting: Physical Medicine and Rehabilitation

## 2021-11-02 ENCOUNTER — Other Ambulatory Visit: Payer: Self-pay

## 2021-11-02 VITALS — BP 106/73 | HR 88 | Temp 97.7°F | Ht 64.0 in | Wt 241.0 lb

## 2021-11-02 DIAGNOSIS — I951 Orthostatic hypotension: Secondary | ICD-10-CM

## 2021-11-02 DIAGNOSIS — G8222 Paraplegia, incomplete: Secondary | ICD-10-CM

## 2021-11-02 DIAGNOSIS — M7918 Myalgia, other site: Secondary | ICD-10-CM | POA: Diagnosis not present

## 2021-11-02 DIAGNOSIS — G43709 Chronic migraine without aura, not intractable, without status migrainosus: Secondary | ICD-10-CM

## 2021-11-02 DIAGNOSIS — G903 Multi-system degeneration of the autonomic nervous system: Secondary | ICD-10-CM

## 2021-11-02 MED ORDER — MIDODRINE HCL 10 MG PO TABS: 10.0000 mg | ORAL_TABLET | Freq: Three times a day (TID) | ORAL | 3 refills | Status: DC

## 2021-11-02 MED ORDER — FLUDROCORTISONE ACETATE 0.1 MG PO TABS: 0.1000 mg | ORAL_TABLET | Freq: Two times a day (BID) | ORAL | 5 refills | Status: DC

## 2021-11-02 NOTE — Patient Instructions (Signed)
Plan: ?1. Pt Has to appeal paperwork - and happy to fill out paperwork filled out paperwork- took 15 minutes of appt.  ? ?2. Pt to have back surgery- PLIF- will talk to surgeon and see if they can prescribe pain meds, since don't have a contract with her for pain meds.  ? ? ?3. Refilled Midodrine 10 mg 3x/day for orthostatic hypotension 5 refills ? ?4. Refilled Florinef 0.1 mg BID- per Cards # 60 5 refills.  ? ?5. Had Cardiac testing- but all negative so far.  ? ?6. Con't Baclofen 10 mg TID for spasticity.  ? ? ?7. Patient here for trigger point injections for ? Consent done and on chart. ? ?Cleaned areas with alcohol and injected using a 27 gauge 1.5 inch needle ? ?Injected 3cc ?Using 1% Lidocaine with no EPI ? ?Upper traps B/L  ?Levators- B/L  ?Posterior scalenes ?Middle scalenes B/L x2 ?Splenius Capitus- B/L  ?Pectoralis Major ?Rhomboids ?Infraspinatus ?Teres Major/minor ?Thoracic paraspinals ?Lumbar paraspinals ?Other injections-  ? ? ?Patient's level of pain prior was 5/10 ?Current level of pain after injections is the same- stil real sore.  ? ?There was no bleeding or complications. ? ?Patient was advised to drink a lot of water on day after injections to flush system ?Will have increased soreness for 12-48 hours after injections.  ?Can use Lidocaine patches the day AFTER injections ?Can use theracane on day of injections in places didn't inject ?Can use heating pad 4-6 hours AFTER injections ? ?8. F/U in 3 months- double appt- SCI ?

## 2021-11-02 NOTE — Progress Notes (Signed)
Pt is a 51 yr old female with paraparesis/incomplete paraplegia due to attempted spinal cord stimulator placement- ?With chronic hx of orthostasis, BMI of 41 - back dwon from 45-  s/p gastric sleeve, Acute on chronic renal issues, here for f/u on SCI and neurogenic orthostatic hypotension. Vestibular issues due to trigger points and central issues.  ? ?Paperwork ?Was denied last time-  ?But hadn't been told until this time- now at risk of losing job.  ? ?Was told high risk for losing job- but wasn't told until now- and has been denied since November.  ? ?2 bottom screws are so loose, need to be replaced and never healed from the 1st surgery- ?Going to get surgery- sometimes soon- hasn't set a date.  ? ?Father just dx'd with bladder and kidney CA- started Chemo next week  ? ? ?Still taking Baclofen 10 mg TID- legs jerk and spasm when falls asleep in recliner- when takes nap-  ? ?Can walk 6 minutes in PT now- but dragging RLE by end.  ? ? ?Also had ?Exam: ?BP 106/73 today sitting- standing is 80/54 ?Using single point cane with quad cane bottom; frustrated and upset about job issues, NAD ? ?MS: ?RLE- HF 4-/5; KE/KF 4-/5; DF 4-/5 and PF 4+/5 ?LLE- 5-/5 in same muscles ? ?Neuro: ?Absent DTRs in B/L LE's.however clonus 4-5 beats of LE's R>L  ? ?Gait- walking antalgic gait with favoring RLE ?Purple cold toes- from raynaud's.  BL ? ?Plan: ?1. Pt Has to appeal paperwork - and happy to fill out paperwork filled out paperwork- took 15 minutes of appt.  ? ?2. Pt to have back surgery- PLIF- will talk to surgeon and see if they can prescribe pain meds, since don't have a contract with her for pain meds.  ? ? ?3. Refilled Midodrine 10 mg 3x/day for orthostatic hypotension 5 refills ? ?4. Refilled Florinef 0.1 mg BID- per Cards # 60 5 refills.  ? ?5. Had Cardiac testing- but all negative so far.  ? ?6. Con't Baclofen 10 mg TID for spasticity.  ? ? ?7. Patient here for trigger point injections for ? Consent done and on  chart. ? ?Cleaned areas with alcohol and injected using a 27 gauge 1.5 inch needle ? ?Injected 3cc ?Using 1% Lidocaine with no EPI ? ?Upper traps B/L  ?Levators- B/L  ?Posterior scalenes ?Middle scalenes B/L x2 ?Splenius Capitus- B/L  ?Pectoralis Major ?Rhomboids ?Infraspinatus ?Teres Major/minor ?Thoracic paraspinals ?Lumbar paraspinals ?Other injections-  ? ? ?Patient's level of pain prior was 5/10 ?Current level of pain after injections is the same- stil real sore.  ? ?There was no bleeding or complications. ? ?Patient was advised to drink a lot of water on day after injections to flush system ?Will have increased soreness for 12-48 hours after injections.  ?Can use Lidocaine patches the day AFTER injections ?Can use theracane on day of injections in places didn't inject ?Can use heating pad 4-6 hours AFTER injections ? ?8. F/U in 3 months- double appt- SCI ? ? ?I spent a total of 41   minutes on total care today- >50% coordination of care- due to paperwork and 10 minutes for trigger point injections and education and dealing with issues relating to maybe losing job ? ? ?

## 2021-11-07 ENCOUNTER — Other Ambulatory Visit: Payer: Self-pay

## 2021-11-07 ENCOUNTER — Ambulatory Visit: Payer: 59 | Attending: Physical Medicine and Rehabilitation

## 2021-11-07 DIAGNOSIS — M6281 Muscle weakness (generalized): Secondary | ICD-10-CM | POA: Diagnosis present

## 2021-11-07 DIAGNOSIS — R2689 Other abnormalities of gait and mobility: Secondary | ICD-10-CM | POA: Insufficient documentation

## 2021-11-07 DIAGNOSIS — R278 Other lack of coordination: Secondary | ICD-10-CM | POA: Diagnosis present

## 2021-11-07 DIAGNOSIS — G8929 Other chronic pain: Secondary | ICD-10-CM | POA: Diagnosis present

## 2021-11-07 DIAGNOSIS — R2681 Unsteadiness on feet: Secondary | ICD-10-CM | POA: Diagnosis present

## 2021-11-07 DIAGNOSIS — M25561 Pain in right knee: Secondary | ICD-10-CM | POA: Diagnosis present

## 2021-11-07 DIAGNOSIS — M545 Low back pain, unspecified: Secondary | ICD-10-CM | POA: Insufficient documentation

## 2021-11-07 DIAGNOSIS — R262 Difficulty in walking, not elsewhere classified: Secondary | ICD-10-CM | POA: Diagnosis not present

## 2021-11-07 NOTE — Therapy (Signed)
[place] MAIN [facility] SERVICES [address] [place], [place], [address] Phone: [phone]   Fax:  [phone]  Physical Therapy Treatment  Patient Details  Name: Vanessa Medina MRN: [id number] Date of Birth: 1971-01-28 No data recorded  Encounter Date: 11/07/2021   PT End of Session - 11/07/21 1618     Visit Number 12    Number of Visits 24    Date for PT Re-Evaluation 11/30/21    Authorization Type [employer] Other    Authorization Time Period 01/24/21-04/18/2021    PT Start Time 1613    PT Stop Time 1700    PT Time Calculation (min) 47 min    Equipment Utilized During Treatment Gait belt    Activity Tolerance Patient tolerated treatment well    Behavior During Therapy WFL for tasks assessed/performed             Past Medical History:  Diagnosis Date   Anemia    Cervical cancer ([place])    Family history of adverse reaction to anesthesia     " my mother takes a long time time wake up"   GERD (gastroesophageal reflux disease)    Headache    migraine   History of dysplastic nevus 01/18/2018   right shoulder, recurrent dysplastic nevus   History of shingles    Hypertension    Migraine    Multiple allergies    Obesity    Osteoarthritis    left hip   PONV (postoperative nausea and vomiting)    Sleep apnea    does not wear CPAP   Stroke ([place]) 08/11/2020   Wears glasses     Past Surgical History:  Procedure Laterality Date   ABDOMINAL HYSTERECTOMY     APPENDECTOMY     BACK SURGERY     BILIOPANCREATIC DIVERSION     with duodenal switch laparoscopic    CHOLECYSTECTOMY     COLONOSCOPY N/A 06/27/2021   Procedure: COLONOSCOPY;  Surgeon: Annamaria Helling, DO;  Location: [facility] ENDOSCOPY;  Service: Gastroenterology;  Laterality: N/A;   DIAGNOSTIC LAPAROSCOPY     LOA   ESOPHAGOGASTRODUODENOSCOPY N/A 06/27/2021   Procedure: ESOPHAGOGASTRODUODENOSCOPY (EGD);  Surgeon: Annamaria Helling, DO;  Location: [facility] ENDOSCOPY;  Service:  Gastroenterology;  Laterality: N/A;   ESOPHAGOGASTRODUODENOSCOPY (EGD) WITH PROPOFOL N/A 07/25/2017   Procedure: ESOPHAGOGASTRODUODENOSCOPY (EGD) WITH PROPOFOL;  Surgeon: Manya Silvas, MD;  Location[facility] ENDOSCOPY;  Service: Endoscopy;  Laterality: N/A;   KNEE ARTHROSCOPY     LAPAROSCOPIC GASTRIC SLEEVE RESECTION WITH HIATAL HERNIA REPAIR     TONSILLECTOMY     TOTAL HIP ARTHROPLASTY Left 01/26/2016   Procedure: LEFT TOTAL HIP ARTHROPLASTY ANTERIOR APPROACH;  Surgeon: Leandrew Koyanagi, MD;  Location: [place];  Service: Orthopedics;  Laterality: Left;   TRANSFORAMINAL LUMBAR INTERBODY FUSION (TLIF) WITH PEDICLE SCREW FIXATION 3 LEVEL  08/2019   Basil Dess, MD L2-L5    There were no vitals filed for this visit.   Subjective Assessment - 11/07/21 1617     Subjective Patient reports she is sitting more with work. Has more pain in bottom of lumbar spine. Is working 4 days a week.    Pertinent History 51 year old female with history of HTN, migraines, morbid obesity--BMI-41, chronic hypotension, multiple back surgeries with chronic pain with LLE lumbar radiculopathy who was scheduled to have spinal cord stimulator placed by Dr. Davy Pique on 08/11/2020 but unable to perform surgery due to scar tissue.  History taken from patient and chart review.  Post op on awakening, she had excruciating pain RLE with plegia and and allodynia. She was admitted for work up by Dr. Ronnald Ramp from surgical center on 08/11/2020. Thoracic MRI done revealing subtle increased T2/STIR intensity distal cord at T12-L1 level suspicious for edema/acute spinal cord injury, no CSF leak as well as prior PLIF L2-L5 with residual foraminal protrusion L3/5 potentially irritating right L3 nerve. She was started on IV Dilaudid, gabapentin as well as IV Decadron for management of pain, headaches and severe neuropathy.  She continues to have pain in RLE but is having some motor return, has constant headache, decreased hearing in right> left ear,  constipation with nausea as well as epigastric pain and weakness. Therapy ongoing and CIR recommended due to functional decline.  Of note, she reports 50 lbs weigh loss in the past 3-4 months--due to issues with N/V/intake. Has had two falls last month---no recall of incident leading to fall question due to syncope. Was in process of work up for renal disease. She has had issue with LLE weakness with pain for the past year and being followed by Dr. Ernestina Patches. Did well with stimulator trial.    Limitations Standing;Walking;House hold activities    How long can you sit comfortably? 1 hour (no recent change)    How long can you stand comfortably? 3-4 min    How long can you walk comfortably? 1/2 block at maximum    Patient Stated Goals Improve strength and limit pain    Currently in Pain? Yes    Pain Score 6     Pain Location Back    Pain Orientation Mid;Lower    Pain Descriptors / Indicators Aching    Pain Type Chronic pain    Pain Onset 1 to 4 weeks ago    Pain Frequency Intermittent                  INTERVENTIONS -Therex:    Nustep level 1 x 3 mins @ 60 SPM, level 2 for 1 min assistance with set up and cues for SPM for cardiovascular and musculoskeletal strengthening.    Circuit 1:  LAQ ( 2 x 10 with 2 second holds)  and 160 feet walking ( x 2) interval training.  *Performed circuit style to minimize needs for rest breaks and improve endurance    Circuit 2:  Marching ( 3# L, 2.5# R) 2 x 10  STS 2 x 5 with B UE assist *Performed circuit style to minimize needs for rest breaks and improve endurance    Circuit 3:  2.5# R 3# L Ankle Weight -heel raise 2x12 -toe raise 2x15 - knees extended to improve ROM -stand with head turns 10x each direction; x2 *Performed circuit style to minimize needs for rest breaks and improve endurance   GTB hamstring curl 15x each LE; 2 sets  Car transfer with CGA and cueing for safe body mechanics and sequencing.    Pt required occasional rest  breaks due fatigue, PT was quick to ask when pt appeared to be fatiguing in order to prevent excessive fatigue.   Pt educated throughout session about proper posture and technique with exercises. Improved exercise technique, movement at target joints, use of target muscles after min to mod verbal, visual, tactile cues.   Patient tolerates physical therapy session well with occasional rest breaks. Circuit form allowed for increased capacity of mobility with decreased need for rest. She does continue to have limited standing duration due to back pain at this time. Patient  remains highly motivated throughout session and is very invested in her progress. Patient will continue to benefit from skilled physical therapy interventions focusing on strength, balance, and mobility in order to improve her quality of life and function.                     PT Education - 11/07/21 1618     Education Details exercise technique, body mechanics    Person(s) Educated Patient    Methods Explanation;Demonstration;Tactile cues;Verbal cues    Comprehension Verbalized understanding;Returned demonstration;Verbal cues required;Tactile cues required              PT Short Term Goals - 10/26/21 1612       PT SHORT TERM GOAL #1   Title Pt will be independent with HEP in order to improve strength and balance in order to decrease fall risk and improve function at home and work.    Baseline Pt performing HEP daily at this time and trying to walk in hallway throughout the day    Time 3    Period Weeks    Status Achieved    Target Date 09/28/21               PT Long Term Goals - 10/26/21 1622       PT LONG TERM GOAL #1   Title Patient will complete five times sit to stand test in < 20 seconds indicating an increased LE strength, power, and improved balance.    Baseline 36.86 sec 09/07/21 10/26/21: 26.6    Time 12    Period Weeks    Status New    Target Date 11/30/21      PT LONG TERM GOAL  #2   Title Pt will decrease TUG to below 14 seconds/decrease in order to demonstrate decreased fall risk.    Baseline TUG 22.99sec 2/22: 21s    Time 12    Period Weeks    Status On-going    Target Date 11/30/21      PT LONG TERM GOAL #3   Title Pt will decrease DHI score by at least 18 points in order to demonstrate clinically significant reduction in disability    Baseline 54 on 09/07/21 72on 10/26/21   Not reassessed, but reports no significant change/improvement with dizziness   Time 12    Period Weeks    Status New      PT LONG TERM GOAL #4   Title Patient will increase 10 meter walk test to >1.42ms as to improve gait speed for better community ambulation and to reduce fall risk.    Baseline .41 on 09/07/21 with SPC    Time 12    Period Weeks    Status Partially Met      PT LONG TERM GOAL #5   Title Patient will increase FOTO score to equal to or greater than 57 to demonstrate statistically significant improvement in mobility and quality of life.    Baseline 45 on 09/07/21 41 on 10/26/21 ( pt having recent knee buckiling)    Time 12    Period Weeks    Status New      PT LONG TERM GOAL #6   Title Patient will increase six minute walk test distance to >1000 for progression to community ambulator and improve gait ability    Baseline Not assessed visit 1, 450 feet on 10/26/21    Time 12    Period Weeks    Status New    Target  Date 11/30/21      PT LONG TERM GOAL #7   Title Patient will ascend/descend 2- 3 steps with 1 rail and use of SPC with modified [place] in front of house to safely enter/exit home.    Baseline Pt required cues, increased time and CGA for safety on standard height steps at IE, unable to complete front steps at home at this time    Time 12    Period Weeks    Status New    Target Date 11/30/21                   Plan - 11/07/21 1711     Clinical Impression Statement Patient tolerates physical therapy session well with occasional rest breaks.  Circuit form allowed for increased capacity of mobility with decreased need for rest. She does continue to have limited standing duration due to back pain at this time. Patient remains highly motivated throughout session and is very invested in her progress. Patient will continue to benefit from skilled physical therapy interventions focusing on strength, balance, and mobility in order to improve her quality of life and function.    Personal Factors and Comorbidities Comorbidity 3+;Time since onset of injury/illness/exacerbation    Comorbidities HYPOtension, paraparesis, SCI without spinal bone injury, DDD, cervical cancer, acute kidney injury, GERD, asthma, bradycardia, L total hip replacement, hypokalemia    Examination-Activity Limitations Squat;Lift;Stairs;Bend;Stand;Reach Overhead;Carry;Transfers    Examination-Participation Restrictions Cleaning;Laundry;Meal Prep;Community Activity;Driving;Occupation    Stability/Clinical Decision Making Evolving/Moderate complexity    Rehab Potential Fair    PT Frequency Other (comment)   1x/every other week   PT Duration 12 weeks    PT Treatment/Interventions ADLs/Self Care Home Management;Aquatic Therapy;Gait training;Stair training;Functional mobility training;Therapeutic activities;Therapeutic exercise;Balance training;Moist Heat;Manual techniques;Patient/family education;Neuromuscular re-education;Passive range of motion;Energy conservation;Vestibular;Joint Manipulations;Spinal Manipulations    PT Next Visit Plan REview HEP, strenght and stair training as able, endurance    PT Home Exercise Plan Will review HEP from previous therapy on visit 2; no updates    Consulted and Agree with Plan of Care Patient             Patient will benefit from skilled therapeutic intervention in order to improve the following deficits and impairments:  Abnormal gait, Dizziness, Improper body mechanics, Decreased mobility, Cardiopulmonary status limiting activity,  Decreased activity tolerance, Decreased endurance, Decreased range of motion, Decreased strength, Decreased balance, Decreased safety awareness, Difficulty walking, Impaired flexibility, Obesity  Visit Diagnosis: Difficulty in walking, not elsewhere classified  Unsteadiness on feet  Other abnormalities of gait and mobility     Problem List Patient Active Problem List   Diagnosis Date Noted   Abnormality of gait 04/29/2021   Abnormal sensation in both ears 04/06/2021   Other adverse food reactions, not elsewhere classified, subsequent encounter 04/06/2021   Multiple drug allergies 04/06/2021   Insomnia due to medical condition 02/25/2021   Myofascial muscle pain 02/25/2021   Nerve pain 10/18/2020   Incomplete paraplegia ([place]) 10/18/2020   Orthostatic hypotension 10/18/2020   Chronic migraine without aura without status migrainosus, not intractable    Hypokalemia    Adjustment reaction with anxiety and depression    Spinal cord injury, lumbar, without spinal bone injury, sequela ([place]) 08/18/2020   Paraparesis ([facility])    Post-operative pain    Hypotension    Slow transit constipation    AKI (acute kidney injury) ([place])    Right leg weakness 08/11/2020   DDD (degenerative disc disease), lumbar 09/02/2019    Class: Chronic  Degenerative disc disease, lumbar 09/02/2019   Chest tightness 09/23/2018   Bradycardia 09/23/2018   Labile blood pressure 03/08/2018   Chronic low back pain 09/03/2017   History of total hip replacement, left 01/26/2016   EDEMA 04/28/2008   LEG PAIN, BILATERAL 12/16/2007   CERVICAL CANCER 07/02/2007   MORBID OBESITY 07/02/2007   DEPRESSION 07/02/2007   COMMON MIGRAINE 07/02/2007   Other allergic rhinitis 07/02/2007   ASTHMA 07/02/2007   GERD 07/02/2007   ELEVATED BLOOD PRESSURE WITHOUT DIAGNOSIS OF HYPERTENSION 07/02/2007    Janna Arch, PT, DPT  11/07/2021, 5:12 PM  [place] [facility]IN [facility] SERVICES [address] [place], [place], [address] Phone: [phone]   Fax:  [phone]  Name: Vanessa Medina MRN: [id number] Date of Birth: September 17, 1970

## 2021-11-08 ENCOUNTER — Ambulatory Visit: Payer: 59 | Admitting: Physical Therapy

## 2021-11-09 ENCOUNTER — Other Ambulatory Visit: Payer: Self-pay

## 2021-11-09 ENCOUNTER — Ambulatory Visit: Payer: 59

## 2021-11-09 DIAGNOSIS — R262 Difficulty in walking, not elsewhere classified: Secondary | ICD-10-CM | POA: Diagnosis not present

## 2021-11-09 DIAGNOSIS — M6281 Muscle weakness (generalized): Secondary | ICD-10-CM

## 2021-11-09 DIAGNOSIS — R2681 Unsteadiness on feet: Secondary | ICD-10-CM

## 2021-11-09 DIAGNOSIS — R2689 Other abnormalities of gait and mobility: Secondary | ICD-10-CM

## 2021-11-09 NOTE — Therapy (Signed)
[place] MAIN [facility] SERVICES [address] [place], [place], [address] Phone: [phone]   Fax:  [phone]  Physical Therapy Treatment  Patient Details  Name: Vanessa Medina MRN: [id number] Date of Birth: 05/15/1971 No data recorded  Encounter Date: 11/09/2021   PT End of Session - 11/09/21 1654     Visit Number 13    Number of Visits 24    Date for PT Re-Evaluation 11/30/21    Authorization Type [employer] Other    Authorization Time Period 01/24/21-04/18/2021    PT Start Time 1649    PT Stop Time 1730    PT Time Calculation (min) 41 min    Equipment Utilized During Treatment Gait belt    Activity Tolerance Patient tolerated treatment well    Behavior During Therapy WFL for tasks assessed/performed             Past Medical History:  Diagnosis Date   Anemia    Cervical cancer ([place])    Family history of adverse reaction to anesthesia     " my mother takes a long time time wake up"   GERD (gastroesophageal reflux disease)    Headache    migraine   History of dysplastic nevus 01/18/2018   right shoulder, recurrent dysplastic nevus   History of shingles    Hypertension    Migraine    Multiple allergies    Obesity    Osteoarthritis    left hip   PONV (postoperative nausea and vomiting)    Sleep apnea    does not wear CPAP   Stroke ([place]) 08/11/2020   Wears glasses     Past Surgical History:  Procedure Laterality Date   ABDOMINAL HYSTERECTOMY     APPENDECTOMY     BACK SURGERY     BILIOPANCREATIC DIVERSION     with duodenal switch laparoscopic    CHOLECYSTECTOMY     COLONOSCOPY N/A 06/27/2021   Procedure: COLONOSCOPY;  Surgeon: Annamaria Helling, DO;  Location: [facility] ENDOSCOPY;  Service: Gastroenterology;  Laterality: N/A;   DIAGNOSTIC LAPAROSCOPY     LOA   ESOPHAGOGASTRODUODENOSCOPY N/A 06/27/2021   Procedure: ESOPHAGOGASTRODUODENOSCOPY (EGD);  Surgeon: Annamaria Helling, DO;  Location: [facility] ENDOSCOPY;  Service:  Gastroenterology;  Laterality: N/A;   ESOPHAGOGASTRODUODENOSCOPY (EGD) WITH PROPOFOL N/A 07/25/2017   Procedure: ESOPHAGOGASTRODUODENOSCOPY (EGD) WITH PROPOFOL;  Surgeon: Manya Silvas, MD;  Location[facility] ENDOSCOPY;  Service: Endoscopy;  Laterality: N/A;   KNEE ARTHROSCOPY     LAPAROSCOPIC GASTRIC SLEEVE RESECTION WITH HIATAL HERNIA REPAIR     TONSILLECTOMY     TOTAL HIP ARTHROPLASTY Left 01/26/2016   Procedure: LEFT TOTAL HIP ARTHROPLASTY ANTERIOR APPROACH;  Surgeon: Leandrew Koyanagi, MD;  Location: [facility];  Service: Orthopedics;  Laterality: Left;   TRANSFORAMINAL LUMBAR INTERBODY FUSION (TLIF) WITH PEDICLE SCREW FIXATION 3 LEVEL  08/2019   Basil Dess, MD L2-L5    There were no vitals filed for this visit.   Subjective Assessment - 11/09/21 1653     Subjective Patient reports her dad has been ill from his first round of chemo. No falls or LOB since last session.    Pertinent History 51 year old female with history of HTN, migraines, morbid obesity--BMI-41, chronic hypotension, multiple back surgeries with chronic pain with LLE lumbar radiculopathy who was scheduled to have spinal cord stimulator placed by Dr. Davy Pique on 08/11/2020 but unable to perform surgery due to scar tissue.  History taken from patient and chart review.  Post  op on awakening, she had excruciating pain RLE with plegia and and allodynia. She was admitted for work up by Dr. Ronnald Ramp from surgical center on 08/11/2020. Thoracic MRI done revealing subtle increased T2/STIR intensity distal cord at T12-L1 level suspicious for edema/acute spinal cord injury, no CSF leak as well as prior PLIF L2-L5 with residual foraminal protrusion L3/5 potentially irritating right L3 nerve. She was started on IV Dilaudid, gabapentin as well as IV Decadron for management of pain, headaches and severe neuropathy.  She continues to have pain in RLE but is having some motor return, has constant headache, decreased hearing in right> left ear, constipation  with nausea as well as epigastric pain and weakness. Therapy ongoing and CIR recommended due to functional decline.  Of note, she reports 50 lbs weigh loss in the past 3-4 months--due to issues with N/V/intake. Has had two falls last month---no recall of incident leading to fall question due to syncope. Was in process of work up for renal disease. She has had issue with LLE weakness with pain for the past year and being followed by Dr. Ernestina Patches. Did well with stimulator trial.    Limitations Standing;Walking;House hold activities    How long can you sit comfortably? 1 hour (no recent change)    How long can you stand comfortably? 3-4 min    How long can you walk comfortably? 1/2 block at maximum    Patient Stated Goals Improve strength and limit pain    Currently in Pain? Yes    Pain Score 6     Pain Location Back    Pain Orientation Mid;Lower    Pain Descriptors / Indicators Aching    Pain Type Chronic pain    Pain Onset 1 to 4 weeks ago    Pain Frequency Intermittent                    INTERVENTIONS -Therex:    Nustep level 1 x 4 mins @ 60 SPM, level 2 for 1 min assistance with set up and cues for SPM for cardiovascular and musculoskeletal strengthening.    Circuit 1: 2x -160 feet walking ( x 2) interval training.  -Standing marches with BUE support 10x each LE *Performed circuit style to minimize needs for rest breaks and improve endurance    Circuit 2: 2x -STS 10x with BUE assist -hip abduction 10x each LE  *Performed circuit style to minimize needs for rest breaks and improve endurance      GTB hamstring curl 15x each LE; 2 sets     Pt required occasional rest breaks due fatigue, PT was quick to ask when pt appeared to be fatiguing in order to prevent excessive fatigue.   Pt educated throughout session about proper posture and technique with exercises. Improved exercise technique, movement at target joints, use of target muscles after min to mod verbal, visual,  tactile cues.       Patient tolerates increased challenge with standing circuit training well with no pain increases. Patient is challenged with lateral abduction requiring cueing for toe tap at end of movement to reduce strain on back. Patient remains highly motivated throughout session. Patient will continue to benefit from skilled physical therapy interventions focusing on strength, balance, and mobility in order to improve her quality of life and function.                 PT Education - 11/09/21 1654     Education Details exercise technique, body mechanics  Person(s) Educated Patient    Methods Explanation;Demonstration;Tactile cues;Verbal cues    Comprehension Verbalized understanding;Returned demonstration;Verbal cues required;Tactile cues required              PT Short Term Goals - 10/26/21 1612       PT SHORT TERM GOAL #1   Title Pt will be independent with HEP in order to improve strength and balance in order to decrease fall risk and improve function at home and work.    Baseline Pt performing HEP daily at this time and trying to walk in hallway throughout the day    Time 3    Period Weeks    Status Achieved    Target Date 09/28/21               PT Long Term Goals - 10/26/21 1622       PT LONG TERM GOAL #1   Title Patient will complete five times sit to stand test in < 20 seconds indicating an increased LE strength, power, and improved balance.    Baseline 36.86 sec 09/07/21 10/26/21: 26.6    Time 12    Period Weeks    Status New    Target Date 11/30/21      PT LONG TERM GOAL #2   Title Pt will decrease TUG to below 14 seconds/decrease in order to demonstrate decreased fall risk.    Baseline TUG 22.99sec 2/22: 21s    Time 12    Period Weeks    Status On-going    Target Date 11/30/21      PT LONG TERM GOAL #3   Title Pt will decrease DHI score by at least 18 points in order to demonstrate clinically significant reduction in disability     Baseline 54 on 09/07/21 72on 10/26/21   Not reassessed, but reports no significant change/improvement with dizziness   Time 12    Period Weeks    Status New      PT LONG TERM GOAL #4   Title Patient will increase 10 meter walk test to >1.31ms as to improve gait speed for better community ambulation and to reduce fall risk.    Baseline .41 on 09/07/21 with SPC    Time 12    Period Weeks    Status Partially Met      PT LONG TERM GOAL #5   Title Patient will increase FOTO score to equal to or greater than 57 to demonstrate statistically significant improvement in mobility and quality of life.    Baseline 45 on 09/07/21 41 on 10/26/21 ( pt having recent knee buckiling)    Time 12    Period Weeks    Status New      PT LONG TERM GOAL #6   Title Patient will increase six minute walk test distance to >1000 for progression to community ambulator and improve gait ability    Baseline Not assessed visit 1, 450 feet on 10/26/21    Time 12    Period Weeks    Status New    Target Date 11/30/21      PT LONG TERM GOAL #7   Title Patient will ascend/descend 2- 3 steps with 1 rail and use of SPC with modified [place] in front of house to safely enter/exit home.    Baseline Pt required cues, increased time and CGA for safety on standard height steps at IE, unable to complete front steps at home at this time    Time 12  Period Weeks    Status New    Target Date 11/30/21                   Plan - 11/09/21 1730     Clinical Impression Statement Patient tolerates increased challenge with standing circuit training well with no pain increases. Patient is challenged with lateral abduction requiring cueing for toe tap at end of movement to reduce strain on back. Patient remains highly motivated throughout session. Patient will continue to benefit from skilled physical therapy interventions focusing on strength, balance, and mobility in order to improve her quality of life and function.     Personal Factors and Comorbidities Comorbidity 3+;Time since onset of injury/illness/exacerbation    Comorbidities HYPOtension, paraparesis, SCI without spinal bone injury, DDD, cervical cancer, acute kidney injury, GERD, asthma, bradycardia, L total hip replacement, hypokalemia    Examination-Activity Limitations Squat;Lift;Stairs;Bend;Stand;Reach Overhead;Carry;Transfers    Examination-Participation Restrictions Cleaning;Laundry;Meal Prep;Community Activity;Driving;Occupation    Stability/Clinical Decision Making Evolving/Moderate complexity    Rehab Potential Fair    PT Frequency Other (comment)   1x/every other week   PT Duration 12 weeks    PT Treatment/Interventions ADLs/Self Care Home Management;Aquatic Therapy;Gait training;Stair training;Functional mobility training;Therapeutic activities;Therapeutic exercise;Balance training;Moist Heat;Manual techniques;Patient/family education;Neuromuscular re-education;Passive range of motion;Energy conservation;Vestibular;Joint Manipulations;Spinal Manipulations    PT Next Visit Plan REview HEP, strenght and stair training as able, endurance    PT Home Exercise Plan Will review HEP from previous therapy on visit 2; no updates    Consulted and Agree with Plan of Care Patient             Patient will benefit from skilled therapeutic intervention in order to improve the following deficits and impairments:  Abnormal gait, Dizziness, Improper body mechanics, Decreased mobility, Cardiopulmonary status limiting activity, Decreased activity tolerance, Decreased endurance, Decreased range of motion, Decreased strength, Decreased balance, Decreased safety awareness, Difficulty walking, Impaired flexibility, Obesity  Visit Diagnosis: Difficulty in walking, not elsewhere classified  Unsteadiness on feet  Other abnormalities of gait and mobility  Muscle weakness (generalized)     Problem List Patient Active Problem List   Diagnosis Date Noted    Abnormality of gait 04/29/2021   Abnormal sensation in both ears 04/06/2021   Other adverse food reactions, not elsewhere classified, subsequent encounter 04/06/2021   Multiple drug allergies 04/06/2021   Insomnia due to medical condition 02/25/2021   Myofascial muscle pain 02/25/2021   Nerve pain 10/18/2020   Incomplete paraplegia ([place]) 10/18/2020   Orthostatic hypotension 10/18/2020   Chronic migraine without aura without status migrainosus, not intractable    Hypokalemia    Adjustment reaction with anxiety and depression    Spinal cord injury, lumbar, without spinal bone injury, sequela ([place]) 08/18/2020   Paraparesis ([place])    Post-operative pain    Hypotension    Slow transit constipation    AKI (acute kidney injury) ([place])    Right leg weakness 08/11/2020   DDD (degenerative disc disease), lumbar 09/02/2019    Class: Chronic   Degenerative disc disease, lumbar 09/02/2019   Chest tightness 09/23/2018   Bradycardia 09/23/2018   Labile blood pressure 03/08/2018   Chronic low back pain 09/03/2017   History of total hip replacement, left 01/26/2016   EDEMA 04/28/2008   LEG PAIN, BILATERAL 12/16/2007   CERVICAL CANCER 07/02/2007   MORBID OBESITY 07/02/2007   DEPRESSION 07/02/2007   COMMON MIGRAINE 07/02/2007   Other allergic rhinitis 07/02/2007   ASTHMA 07/02/2007   GERD 07/02/2007   ELEVATED BLOOD PRESSURE  WITHOUT DIAGNOSIS OF HYPERTENSION 07/02/2007   Janna Arch, PT, DPT  11/09/2021, 5:31 PM  [place] MAIN [facility] SERVICES [address] [place], [place], [address] Phone: [phone]   Fax:  [phone]  Name: Vanessa Medina MRN: [id number] Date of Birth: Sep 15, 1970

## 2021-11-10 ENCOUNTER — Ambulatory Visit: Payer: 59

## 2021-11-14 ENCOUNTER — Other Ambulatory Visit: Payer: Self-pay

## 2021-11-14 ENCOUNTER — Ambulatory Visit: Payer: 59

## 2021-11-14 DIAGNOSIS — M6281 Muscle weakness (generalized): Secondary | ICD-10-CM

## 2021-11-14 DIAGNOSIS — M545 Low back pain, unspecified: Secondary | ICD-10-CM

## 2021-11-14 DIAGNOSIS — R262 Difficulty in walking, not elsewhere classified: Secondary | ICD-10-CM

## 2021-11-14 DIAGNOSIS — R278 Other lack of coordination: Secondary | ICD-10-CM

## 2021-11-14 DIAGNOSIS — R2689 Other abnormalities of gait and mobility: Secondary | ICD-10-CM

## 2021-11-14 DIAGNOSIS — R2681 Unsteadiness on feet: Secondary | ICD-10-CM

## 2021-11-14 DIAGNOSIS — G8929 Other chronic pain: Secondary | ICD-10-CM

## 2021-11-14 NOTE — Therapy (Unsigned)
[place] MAIN [facility] SERVICES [address] [place], [place], [address] Phone: [phone]   Fax:  [phone]  Physical Therapy Treatment  Patient Details  Name: Vanessa Medina MRN: [id number] Date of Birth: [birth year] No data recorded  Encounter Date: 11/14/2021   PT End of Session - 11/14/21 1615     Visit Number 14    Number of Visits 24    Date for PT Re-Evaluation 11/30/21    Authorization Type UHC Other    Authorization Time Period 01/24/21-04/18/2021    PT Start Time 1607    Equipment Utilized During Treatment Gait belt    Activity Tolerance Patient tolerated treatment well    Behavior During Therapy [facility] for tasks assessed/performed             Past Medical History:  Diagnosis Date   Anemia    Cervical cancer ([place])    Family history of adverse reaction to anesthesia     " my mother takes a long time time wake up"   GERD (gastroesophageal reflux disease)    Headache    migraine   History of dysplastic nevus 01/18/2018   right shoulder, recurrent dysplastic nevus   History of shingles    Hypertension    Migraine    Multiple allergies    Obesity    Osteoarthritis    left hip   PONV (postoperative nausea and vomiting)    Sleep apnea    does not wear CPAP   Stroke ([place]) 08/11/2020   Wears glasses     Past Surgical History:  Procedure Laterality Date   ABDOMINAL HYSTERECTOMY     APPENDECTOMY     BACK SURGERY     BILIOPANCREATIC DIVERSION     with duodenal switch laparoscopic    CHOLECYSTECTOMY     COLONOSCOPY N/A 06/27/2021   Procedure: COLONOSCOPY;  Surgeon: Annamaria Helling, DO;  Location: [facility] ENDOSCOPY;  Service: Gastroenterology;  Laterality: N/A;   DIAGNOSTIC LAPAROSCOPY     LOA   ESOPHAGOGASTRODUODENOSCOPY N/A 06/27/2021   Procedure: ESOPHAGOGASTRODUODENOSCOPY (EGD);  Surgeon: Annamaria Helling, DO;  Location: [facility] ENDOSCOPY;  Service: Gastroenterology;  Laterality: N/A;    ESOPHAGOGASTRODUODENOSCOPY (EGD) WITH PROPOFOL N/A 07/25/2017   Procedure: ESOPHAGOGASTRODUODENOSCOPY (EGD) WITH PROPOFOL;  Surgeon: Manya Silvas, MD;  Location: [facility] ENDOSCOPY;  Service: Endoscopy;  Laterality: N/A;   KNEE ARTHROSCOPY     LAPAROSCOPIC GASTRIC SLEEVE RESECTION WITH HIATAL HERNIA REPAIR     TONSILLECTOMY     TOTAL HIP ARTHROPLASTY Left 01/26/2016   Procedure: LEFT TOTAL HIP ARTHROPLASTY ANTERIOR APPROACH;  Surgeon: Leandrew Koyanagi, MD;  Location: [place];  Service: Orthopedics;  Laterality: Left;   TRANSFORAMINAL LUMBAR INTERBODY FUSION (TLIF) WITH PEDICLE SCREW FIXATION 3 LEVEL  08/2019   Basil Dess, MD L2-L5    There were no vitals filed for this visit.   Subjective Assessment - 11/14/21 1612     Subjective Patient reports having 2 falls today- fell out of bed trying to get out of bed too quickly. 2nd fall immediately after as her feet were tangled up in blanket.    Pertinent History 51 year old female with history of HTN, migraines, morbid obesity--BMI-41, chronic hypotension, multiple back surgeries with chronic pain with LLE lumbar radiculopathy who was scheduled to have spinal cord stimulator placed by Dr. Davy Pique on 08/11/2020 but unable to perform surgery due to scar tissue.  History taken from patient and chart review.  Post op on awakening, she had excruciating  pain RLE with plegia and and allodynia. She was admitted for work up by Dr. Ronnald Ramp from surgical center on 08/11/2020. Thoracic MRI done revealing subtle increased T2/STIR intensity distal cord at T12-L1 level suspicious for edema/acute spinal cord injury, no CSF leak as well as prior PLIF L2-L5 with residual foraminal protrusion L3/5 potentially irritating right L3 nerve. She was started on IV Dilaudid, gabapentin as well as IV Decadron for management of pain, headaches and severe neuropathy.  She continues to have pain in RLE but is having some motor return, has constant headache, decreased hearing in right> left ear,  constipation with nausea as well as epigastric pain and weakness. Therapy ongoing and CIR recommended due to functional decline.  Of note, she reports 50 lbs weigh loss in the past 3-4 months--due to issues with N/V/intake. Has had two falls last month---no recall of incident leading to fall question due to syncope. Was in process of work up for renal disease. She has had issue with LLE weakness with pain for the past year and being followed by Dr. Ernestina Patches. Did well with stimulator trial.    Limitations Standing;Walking;House hold activities    How long can you sit comfortably? 1 hour (no recent change)    How long can you stand comfortably? 3-4 min    How long can you walk comfortably? 1/2 block at maximum    Patient Stated Goals Improve strength and limit pain    Currently in Pain? Yes    Pain Score 7     Pain Location Back    Pain Orientation Posterior;Lower    Pain Descriptors / Indicators Aching;Sore    Pain Type Chronic pain    Pain Onset 1 to 4 weeks ago    Pain Frequency Intermittent              INTERVENTIONS:   Nustep L0- LE/UE for 5 min (moist heat on low back while performing Nustep)    Seated HS curl x 10 reps x 2 sets using GTB LAQ with 3 sec hold and the 3 sec eccentric control back to resting  Step up onto 4" step with BUE Support x 12 reps each  Ladder drills- Forward stepping x length of // bars and back x 8- (4 without UE support and 4 with 1 UE support)   Ladder drills- Side step x length of bars and back x 6 (patient performed slow and controlled with 2 hand assist   Gait - in clinic (gym and hallway) using SPC approx 150 feet - patient was limited by low back pain today - short reciprocal steps.  Education provided throughout session via VC/TC and demonstration to facilitate movement at target joints and correct muscle activation for all testing and exercises performed.      Patient presented today with good motivation despite not feeling well due to  low back pain from 2 falls this am. She                PT Education - 11/14/21 1615     Education Details Exercise technique    Person(s) Educated Patient    Methods Explanation;Demonstration;Tactile cues;Verbal cues    Comprehension Verbalized understanding;Returned demonstration;Verbal cues required;Tactile cues required;Need further instruction              PT Short Term Goals - 10/26/21 1612       PT SHORT TERM GOAL #1   Title Pt will be independent with HEP in order to improve strength and balance  in order to decrease fall risk and improve function at home and work.    Baseline Pt performing HEP daily at this time and trying to walk in hallway throughout the day    Time 3    Period Weeks    Status Achieved    Target Date 09/28/21               PT Long Term Goals - 10/26/21 1622       PT LONG TERM GOAL #1   Title Patient will complete five times sit to stand test in < 20 seconds indicating an increased LE strength, power, and improved balance.    Baseline 36.86 sec 09/07/21 10/26/21: 26.6    Time 12    Period Weeks    Status New    Target Date 11/30/21      PT LONG TERM GOAL #2   Title Pt will decrease TUG to below 14 seconds/decrease in order to demonstrate decreased fall risk.    Baseline TUG 22.99sec 2/22: 21s    Time 12    Period Weeks    Status On-going    Target Date 11/30/21      PT LONG TERM GOAL #3   Title Pt will decrease DHI score by at least 18 points in order to demonstrate clinically significant reduction in disability    Baseline 54 on 09/07/21 72on 10/26/21   Not reassessed, but reports no significant change/improvement with dizziness   Time 12    Period Weeks    Status New      PT LONG TERM GOAL #4   Title Patient will increase 10 meter walk test to >1.41ms as to improve gait speed for better community ambulation and to reduce fall risk.    Baseline .41 on 09/07/21 with SPC    Time 12    Period Weeks    Status Partially Met       PT LONG TERM GOAL #5   Title Patient will increase FOTO score to equal to or greater than 57 to demonstrate statistically significant improvement in mobility and quality of life.    Baseline 45 on 09/07/21 41 on 10/26/21 ( pt having recent knee buckiling)    Time 12    Period Weeks    Status New      PT LONG TERM GOAL #6   Title Patient will increase six minute walk test distance to >1000 for progression to community ambulator and improve gait ability    Baseline Not assessed visit 1, 450 feet on 10/26/21    Time 12    Period Weeks    Status New    Target Date 11/30/21      PT LONG TERM GOAL #7   Title Patient will ascend/descend 2- 3 steps with 1 rail and use of SPC with modified [place] in front of house to safely enter/exit home.    Baseline Pt required cues, increased time and CGA for safety on standard height steps at IE, unable to complete front steps at home at this time    Time 12    Period Weeks    Status New    Target Date 11/30/21                   Plan - 11/14/21 1616     Personal Factors and Comorbidities Comorbidity 3+;Time since onset of injury/illness/exacerbation    Comorbidities HYPOtension, paraparesis, SCI without spinal bone injury, DDD, cervical cancer, acute kidney injury, GERD, asthma,  bradycardia, L total hip replacement, hypokalemia    Examination-Activity Limitations Squat;Lift;Stairs;Bend;Stand;Reach Overhead;Carry;Transfers    Examination-Participation Restrictions Cleaning;Laundry;Meal Prep;Community Activity;Driving;Occupation    Stability/Clinical Decision Making Evolving/Moderate complexity    Rehab Potential Fair    PT Frequency Other (comment)   1x/every other week   PT Duration 12 weeks    PT Treatment/Interventions ADLs/Self Care Home Management;Aquatic Therapy;Gait training;Stair training;Functional mobility training;Therapeutic activities;Therapeutic exercise;Balance training;Moist Heat;Manual techniques;Patient/family  education;Neuromuscular re-education;Passive range of motion;Energy conservation;Vestibular;Joint Manipulations;Spinal Manipulations    PT Next Visit Plan REview HEP, strenght and stair training as able, endurance    PT Home Exercise Plan Will review HEP from previous therapy on visit 2; no updates    Consulted and Agree with Plan of Care Patient             Patient will benefit from skilled therapeutic intervention in order to improve the following deficits and impairments:  Abnormal gait, Dizziness, Improper body mechanics, Decreased mobility, Cardiopulmonary status limiting activity, Decreased activity tolerance, Decreased endurance, Decreased range of motion, Decreased strength, Decreased balance, Decreased safety awareness, Difficulty walking, Impaired flexibility, Obesity  Visit Diagnosis: Difficulty in walking, not elsewhere classified  Unsteadiness on feet  Other abnormalities of gait and mobility  Muscle weakness (generalized)  Other lack of coordination  Chronic midline low back pain, unspecified whether sciatica present  Chronic pain of right knee     Problem List Patient Active Problem List   Diagnosis Date Noted   Abnormality of gait 04/29/2021   Abnormal sensation in both ears 04/06/2021   Other adverse food reactions, not elsewhere classified, subsequent encounter 04/06/2021   Multiple drug allergies 04/06/2021   Insomnia due to medical condition 02/25/2021   Myofascial muscle pain 02/25/2021   Nerve pain 10/18/2020   Incomplete paraplegia (H[facility]) 10/18/2020   Orthostatic hypotension 10/18/2020   Chronic migraine without aura without status migrainosus, not intractable    Hypokalemia    Adjustment reaction with anxiety and depression    Spinal cord injury, lumbar, without spinal bone injury, sequela ([place]) 08/18/2020   Paraparesis (H[facility])    Post-operative pain    Hypotension    Slow transit constipation    AKI (acute kidney injury) ([place])    Right leg  weakness 08/11/2020   DDD (degenerative disc disease), lumbar 09/02/2019    Class: Chronic   Degenerative disc disease, lumbar 09/02/2019   Chest tightness 09/23/2018   Bradycardia 09/23/2018   Labile blood pressure 03/08/2018   Chronic low back pain 09/03/2017   History of total hip replacement, left 01/26/2016   EDEMA 04/28/2008   LEG PAIN, BILATERAL 12/16/2007   CERVICAL CANCER 07/02/2007   MORBID OBESITY 07/02/2007   DEPRESSION 07/02/2007   COMMON MIGRAINE 07/02/2007   Other allergic rhinitis 07/02/2007   ASTHMA 07/02/2007   GERD 07/02/2007   ELEVATED BLOOD PRESSURE WITHOUT DIAGNOSIS OF HYPERTENSION 07/02/2007    Lewis Moccasin, PT 11/14/2021, 4:19 PM  [place] MAIN [facility] SERVICES [address] [place], [place], [address] Phone: [phone]   Fax:  [phone]  Name: Vanessa Medina MRN: [id number] Date of Birth: 05/15/1971

## 2021-11-16 ENCOUNTER — Ambulatory Visit: Payer: 59 | Admitting: Physical Therapy

## 2021-11-21 ENCOUNTER — Ambulatory Visit: Payer: 59 | Admitting: Physical Therapy

## 2021-11-23 ENCOUNTER — Ambulatory Visit: Payer: 59 | Admitting: Physical Therapy

## 2021-11-24 ENCOUNTER — Ambulatory Visit: Payer: 59

## 2021-11-24 ENCOUNTER — Other Ambulatory Visit: Payer: Self-pay

## 2021-11-24 DIAGNOSIS — M545 Low back pain, unspecified: Secondary | ICD-10-CM

## 2021-11-24 DIAGNOSIS — R262 Difficulty in walking, not elsewhere classified: Secondary | ICD-10-CM

## 2021-11-24 DIAGNOSIS — R2681 Unsteadiness on feet: Secondary | ICD-10-CM

## 2021-11-24 DIAGNOSIS — M6281 Muscle weakness (generalized): Secondary | ICD-10-CM

## 2021-11-24 DIAGNOSIS — R278 Other lack of coordination: Secondary | ICD-10-CM

## 2021-11-24 DIAGNOSIS — R2689 Other abnormalities of gait and mobility: Secondary | ICD-10-CM

## 2021-11-24 NOTE — Therapy (Signed)
[place] ?[place] [facility] SERVICES ?[place], [place], [address] ?Phone: [phone]   Fax:  [phone] ? ?Physical Therapy Treatment ? ?Patient Details  ?Name: Vanessa Medina ?MRN: [id number] ?Date of Birth: July 07, 1971 ?No data recorded ? ?Encounter Date: 11/24/2021 ? ? PT End of Session - 11/24/21 1704   ? ? Visit Number 15   ? Number of Visits 24   ? Date for PT Re-Evaluation 11/30/21   ? Authorization Type UHC Other   ? Authorization Time Period 01/24/21-04/18/2021   ? PT Start Time 1602   ? PT Stop Time 9381   ? PT Time Calculation (min) 46 min   ? Equipment Utilized During Treatment Gait belt   ? Activity Tolerance Patient tolerated treatment well   ? Behavior During Therapy [facility] for tasks assessed/performed   ? ?  ?  ? ?  ? ? ?Past Medical History:  ?Diagnosis Date  ? Anemia   ? Cervical cancer ([place])   ? Family history of adverse reaction to anesthesia   ?  " my mother takes a long time time wake up"  ? GERD (gastroesophageal reflux disease)   ? Headache   ? migraine  ? History of dysplastic nevus 01/18/2018  ? right shoulder, recurrent dysplastic nevus  ? History of shingles   ? Hypertension   ? Migraine   ? Multiple allergies   ? Obesity   ? Osteoarthritis   ? left hip  ? PONV (postoperative nausea and vomiting)   ? Sleep apnea   ? does not wear CPAP  ? Stroke [facility]) 08/11/2020  ? Wears glasses   ? ? ?Past Surgical History:  ?Procedure Laterality Date  ? ABDOMINAL HYSTERECTOMY    ? APPENDECTOMY    ? BACK SURGERY    ? BILIOPANCREATIC DIVERSION    ? with duodenal switch laparoscopic   ? CHOLECYSTECTOMY    ? COLONOSCOPY N/A 06/27/2021  ? Procedure: COLONOSCOPY;  Surgeon: Annamaria Helling, DO;  Location: [facility] ENDOSCOPY;  Service: Gastroenterology;  Laterality: N/A;  ? DIAGNOSTIC LAPAROSCOPY    ? LOA  ? ESOPHAGOGASTRODUODENOSCOPY N/A 06/27/2021  ? Procedure: ESOPHAGOGASTRODUODENOSCOPY (EGD);  Surgeon: Annamaria Helling, DO;  Location: [facility] ENDOSCOPY;   Service: Gastroenterology;  Laterality: N/A;  ? ESOPHAGOGASTRODUODENOSCOPY (EGD) WITH PROPOFOL N/A 07/25/2017  ? Procedure: ESOPHAGOGASTRODUODENOSCOPY (EGD) WITH PROPOFOL;  Surgeon: Manya Silvas, MD;  Location: [facility] ENDOSCOPY;  Service: Endoscopy;  Laterality: N/A;  ? KNEE ARTHROSCOPY    ? LAPAROSCOPIC GASTRIC SLEEVE RESECTION WITH HIATAL HERNIA REPAIR    ? TONSILLECTOMY    ? TOTAL HIP ARTHROPLASTY Left 01/26/2016  ? Procedure: LEFT TOTAL HIP ARTHROPLASTY ANTERIOR APPROACH;  Surgeon: Leandrew Koyanagi, MD;  Location: [place];  Service: Orthopedics;  Laterality: Left;  ? TRANSFORAMINAL LUMBAR INTERBODY FUSION (TLIF) WITH PEDICLE SCREW FIXATION 3 LEVEL  08/2019  ? Basil Dess, MD L2-L5  ? ? ?There were no vitals filed for this visit. ? ? Subjective Assessment - 11/24/21 1705   ? ? Subjective Patient reports still feeling rough with ongoing back pain and lots of personal issues with work and the health of her dad. States remaining positive and continuing to work hard to manage her pain and improve her function.   ? Pertinent History 51 year old female with history of HTN, migraines, morbid obesity--BMI-41, chronic hypotension, multiple back surgeries with chronic pain with LLE lumbar radiculopathy who was scheduled to have spinal cord stimulator placed by Dr. Davy Pique on 08/11/2020 but  unable to perform surgery due to scar tissue.  History taken from patient and chart review.  Post op on awakening, she had excruciating pain RLE with plegia and and allodynia. She was admitted for work up by Dr. Ronnald Ramp from surgical center on 08/11/2020. Thoracic MRI done revealing subtle increased T2/STIR intensity distal cord at T12-L1 level suspicious for edema/acute spinal cord injury, no CSF leak as well as prior PLIF L2-L5 with residual foraminal protrusion L3/5 potentially irritating right L3 nerve. She was started on IV Dilaudid, gabapentin as well as IV Decadron for management of pain, headaches and severe neuropathy.  She continues  to have pain in RLE but is having some motor return, has constant headache, decreased hearing in right> left ear, constipation with nausea as well as epigastric pain and weakness. Therapy ongoing and CIR recommended due to functional decline.  Of note, she reports 50 lbs weigh loss in the past 3-4 months--due to issues with N/V/intake. Has had two falls last month---no recall of incident leading to fall question due to syncope. Was in process of work up for renal disease. She has had issue with LLE weakness with pain for the past year and being followed by Dr. Ernestina Patches. Did well with stimulator trial.   ? Limitations Standing;Walking;House hold activities   ? How long can you sit comfortably? 1 hour (no recent change)   ? How long can you stand comfortably? 3-4 min   ? How long can you walk comfortably? 1/2 block at maximum   ? Patient Stated Goals Improve strength and limit pain   ? Currently in Pain? Yes   ? Pain Score 8    ? Pain Location Back   ? Pain Orientation Posterior;Lower   ? Pain Descriptors / Indicators Aching   ? Pain Type Chronic pain   ? Pain Onset 1 to 4 weeks ago   ? Pain Frequency Intermittent   ? ?  ?  ? ?  ? ? ?INTERVENTIONS -Therex:  ?  ?Nustep level 1 x 4 mins @ 60 SPM, level 2 for 1 min assistance with set up and cues for SPM for cardiovascular and musculoskeletal strengthening.  ?  ?Circuit 1:  ? ?Seated hamstring curl with GTB 2x ?Seated scap retraction with GTB 2x ?2 min step tap in // bars  ?RPE= 4/10 ? ?Circuit 2  ?STS 10x with BUE assist ?Roselie Awkward press (bicep curl into shoulder press)  (5lb bar)  x 10 reps.  ?-160 feet walking with single point cane ?RPE= 4/10 ?  ?Circuit 3:  ?--hip flex/abduction over 1/2 foam-  10x each LE  ?- Shoulder flex with 5lb  bar ?- gait around 230 feet using SPC- slow cadence- reciprocal steps.  ? ?  ?  ?  ?Pt required occasional rest breaks due fatigue, PT was quick to ask when pt appeared to be fatiguing in order to prevent excessive fatigue. Patient utilized  RPE Scale  ?  ?Pt educated throughout session about proper posture and technique with exercises. Improved exercise technique, movement at target joints, use of target muscles after min to mod verbal, visual, tactile cues. ?  ?  ?  ?  ?  ? ?  ?  ?  ?  ?  ?  ?  ? ? ? ? ? ? ? ? ? ? ? ? ? ? ? ? ? ? ? ? ? ? ? ? ? ? PT Education - 11/24/21 1705   ? ? Education Details Exercise technique and  use of RPE scale   ? Person(s) Educated Patient   ? Methods Explanation;Demonstration;Tactile cues;Verbal cues   ? Comprehension Verbalized understanding;Returned demonstration;Verbal cues required;Need further instruction;Tactile cues required   ? ?  ?  ? ?  ? ? ? PT Short Term Goals - 10/26/21 1612   ? ?  ? PT SHORT TERM GOAL #1  ? Title Pt will be independent with HEP in order to improve strength and balance in order to decrease fall risk and improve function at home and work.   ? Baseline Pt performing HEP daily at this time and trying to walk in hallway throughout the day   ? Time 3   ? Period Weeks   ? Status Achieved   ? Target Date 09/28/21   ? ?  ?  ? ?  ? ? ? ? PT Long Term Goals - 10/26/21 1622   ? ?  ? PT LONG TERM GOAL #1  ? Title Patient will complete five times sit to stand test in < 20 seconds indicating an increased LE strength, power, and improved balance.   ? Baseline 36.86 sec 09/07/21 10/26/21: 26.6   ? Time 12   ? Period Weeks   ? Status New   ? Target Date 11/30/21   ?  ? PT LONG TERM GOAL #2  ? Title Pt will decrease TUG to below 14 seconds/decrease in order to demonstrate decreased fall risk.   ? Baseline TUG 22.99sec 2/22: 21s   ? Time 12   ? Period Weeks   ? Status On-going   ? Target Date 11/30/21   ?  ? PT LONG TERM GOAL #3  ? Title Pt will decrease DHI score by at least 18 points in order to demonstrate clinically significant reduction in disability   ? Baseline 54 on 09/07/21 72on 10/26/21   Not reassessed, but reports no significant change/improvement with dizziness  ? Time 12   ? Period Weeks   ? Status New    ?  ? PT LONG TERM GOAL #4  ? Title Patient will increase 10 meter walk test to >1.22ms as to improve gait speed for better community ambulation and to reduce fall risk.   ? Baseline .41 on 09/07/21 with SPC

## 2021-11-28 ENCOUNTER — Ambulatory Visit: Payer: 59 | Admitting: Physical Therapy

## 2021-11-28 ENCOUNTER — Other Ambulatory Visit: Payer: Self-pay

## 2021-11-28 ENCOUNTER — Ambulatory Visit: Payer: 59

## 2021-11-28 DIAGNOSIS — R2689 Other abnormalities of gait and mobility: Secondary | ICD-10-CM

## 2021-11-28 DIAGNOSIS — R278 Other lack of coordination: Secondary | ICD-10-CM

## 2021-11-28 DIAGNOSIS — R2681 Unsteadiness on feet: Secondary | ICD-10-CM

## 2021-11-28 DIAGNOSIS — M545 Low back pain, unspecified: Secondary | ICD-10-CM

## 2021-11-28 DIAGNOSIS — R262 Difficulty in walking, not elsewhere classified: Secondary | ICD-10-CM | POA: Diagnosis not present

## 2021-11-28 DIAGNOSIS — M6281 Muscle weakness (generalized): Secondary | ICD-10-CM

## 2021-11-28 NOTE — Therapy (Signed)
[facility] ?[place], [place], [address] ?Phone: [phone]   Fax:  [phone] ? ?Physical Therapy Treatment ? ?Patient Details  ?Name: Vanessa Medina ?MRN: [id number] ?Date of Birth: July 20, 1971 ?No data recorded ? ?Encounter Date: 11/28/2021 ? ? PT End of Session - 11/28/21 1406   ? ? Visit Number 16   ? Number of Visits 24   ? Date for PT Re-Evaluation 11/30/21   ? Authorization Type UHC Other   ? Authorization Time Period 01/24/21-04/18/2021   ? PT Start Time 1602   ? PT Stop Time 3875   ? PT Time Calculation (min) 48 min   ? Equipment Utilized During Treatment Gait belt   ? Activity Tolerance Patient tolerated treatment well   ? Behavior During Therapy [facility] for tasks assessed/performed   ? ?  ?  ? ?  ? ? ?Past Medical History:  ?Diagnosis Date  ? Anemia   ? Cervical cancer ([place])   ? Family history of adverse reaction to anesthesia   ?  " my mother takes a long time time wake up"  ? GERD (gastroesophageal reflux disease)   ? Headache   ? migraine  ? History of dysplastic nevus 01/18/2018  ? right shoulder, recurrent dysplastic nevus  ? History of shingles   ? Hypertension   ? Migraine   ? Multiple allergies   ? Obesity   ? Osteoarthritis   ? left hip  ? PONV (postoperative nausea and vomiting)   ? Sleep apnea   ? does not wear CPAP  ? Stroke [facility]) 08/11/2020  ? Wears glasses   ? ? ?Past Surgical History:  ?Procedure Laterality Date  ? ABDOMINAL HYSTERECTOMY    ? APPENDECTOMY    ? BACK SURGERY    ? BILIOPANCREATIC DIVERSION    ? with duodenal switch laparoscopic   ? CHOLECYSTECTOMY    ? COLONOSCOPY N/A 06/27/2021  ? Procedure: COLONOSCOPY;  Surgeon: Annamaria Helling, DO;  Location: [facility] ENDOSCOPY;  Service: Gastroenterology;  Laterality: N/A;  ? DIAGNOSTIC LAPAROSCOPY    ? LOA  ? ESOPHAGOGASTRODUODENOSCOPY N/A 06/27/2021  ? Procedure: ESOPHAGOGASTRODUODENOSCOPY (EGD);  Surgeon: Annamaria Helling, DO;  Location: [facility] ENDOSCOPY;   Service: Gastroenterology;  Laterality: N/A;  ? ESOPHAGOGASTRODUODENOSCOPY (EGD) WITH PROPOFOL N/A 07/25/2017  ? Procedure: ESOPHAGOGASTRODUODENOSCOPY (EGD) WITH PROPOFOL;  Surgeon: Manya Silvas, MD;  Location: [facility] ENDOSCOPY;  Service: Endoscopy;  Laterality: N/A;  ? KNEE ARTHROSCOPY    ? LAPAROSCOPIC GASTRIC SLEEVE RESECTION WITH HIATAL HERNIA REPAIR    ? TONSILLECTOMY    ? TOTAL HIP ARTHROPLASTY Left 01/26/2016  ? Procedure: LEFT TOTAL HIP ARTHROPLASTY ANTERIOR APPROACH;  Surgeon: Leandrew Koyanagi, MD;  Location: [place];  Service: Orthopedics;  Laterality: Left;  ? TRANSFORAMINAL LUMBAR INTERBODY FUSION (TLIF) WITH PEDICLE SCREW FIXATION 3 LEVEL  08/2019  ? Basil Dess, MD L2-L5  ? ? ?There were no vitals filed for this visit. ? ? Subjective Assessment - 11/28/21 1403   ? ? Subjective Patient reports she has been cleaning her house all day. Reports doing well overall.   ? Pertinent History 51 year old female with history of HTN, migraines, morbid obesity--BMI-41, chronic hypotension, multiple back surgeries with chronic pain with LLE lumbar radiculopathy who was scheduled to have spinal cord stimulator placed by Dr. Davy Pique on 08/11/2020 but unable to perform surgery due to scar tissue.  History taken from patient and chart review.  Post op on awakening, she had excruciating  pain RLE with plegia and and allodynia. She was admitted for work up by Dr. Ronnald Ramp from surgical center on 08/11/2020. Thoracic MRI done revealing subtle increased T2/STIR intensity distal cord at T12-L1 level suspicious for edema/acute spinal cord injury, no CSF leak as well as prior PLIF L2-L5 with residual foraminal protrusion L3/5 potentially irritating right L3 nerve. She was started on IV Dilaudid, gabapentin as well as IV Decadron for management of pain, headaches and severe neuropathy.  She continues to have pain in RLE but is having some motor return, has constant headache, decreased hearing in right> left ear, constipation with nausea  as well as epigastric pain and weakness. Therapy ongoing and CIR recommended due to functional decline.  Of note, she reports 50 lbs weigh loss in the past 3-4 months--due to issues with N/V/intake. Has had two falls last month---no recall of incident leading to fall question due to syncope. Was in process of work up for renal disease. She has had issue with LLE weakness with pain for the past year and being followed by Dr. Ernestina Patches. Did well with stimulator trial.   ? Limitations Standing;Walking;House hold activities   ? How long can you sit comfortably? 1 hour (no recent change)   ? How long can you stand comfortably? 3-4 min   ? How long can you walk comfortably? 1/2 block at maximum   ? Patient Stated Goals Improve strength and limit pain   ? Currently in Pain? Yes   ? Pain Score 5    ? Pain Location Back   ? Pain Orientation Posterior;Lower   ? Pain Descriptors / Indicators Aching   ? Pain Type Chronic pain   ? Pain Radiating Towards Rest   ? Pain Onset 1 to 4 weeks ago   ? Pain Frequency Intermittent   ? ?  ?  ? ?  ? ? ?INTERVENTIONS:  ? ?Therex:  ? ?Step tap onto 1st step x 10 reps with B UE Support ?Step ups onto 1st step with BUE support x 10 reps ? ?Seated hamstring curl RTB - x 12 reps each LE ? ?Standing- Side step each direction onto airex pad on left and green therapad on right x 10 each - Last 2 without UE support. ? ?Standing side squat to left and right onto airex pad on right and green therapad to left x 12 reps each side ? ?Standing Calf raises from 1/2 foam - 12 reps with slow/control.  ? ?Gait - Walk from clinic to upstairs out main entrance to car ?- Total distance > 300 feet using cane- Slow reciprocal steps with CGA and use of gait belt.  ? ?Education provided throughout session via VC/TC and demonstration to facilitate movement at target joints and correct muscle activation for all testing and exercises performed.  ? ? ? ? ? ? ? ? ? ? ? ? ? ? ? ? ? ? ? ? ? ? ? ? PT Education - 11/29/21 1406    ? ? Education Details Exercise technique   ? Person(s) Educated Patient   ? Methods Explanation;Demonstration;Tactile cues;Verbal cues   ? Comprehension Verbalized understanding;Returned demonstration;Verbal cues required;Tactile cues required;Need further instruction   ? ?  ?  ? ?  ? ? ? PT Short Term Goals - 10/26/21 1612   ? ?  ? PT SHORT TERM GOAL #1  ? Title Pt will be independent with HEP in order to improve strength and balance in order to decrease fall risk and improve function  at home and work.   ? Baseline Pt performing HEP daily at this time and trying to walk in hallway throughout the day   ? Time 3   ? Period Weeks   ? Status Achieved   ? Target Date 09/28/21   ? ?  ?  ? ?  ? ? ? ? PT Long Term Goals - 10/26/21 1622   ? ?  ? PT LONG TERM GOAL #1  ? Title Patient will complete five times sit to stand test in < 20 seconds indicating an increased LE strength, power, and improved balance.   ? Baseline 36.86 sec 09/07/21 10/26/21: 26.6   ? Time 12   ? Period Weeks   ? Status New   ? Target Date 11/30/21   ?  ? PT LONG TERM GOAL #2  ? Title Pt will decrease TUG to below 14 seconds/decrease in order to demonstrate decreased fall risk.   ? Baseline TUG 22.99sec 2/22: 21s   ? Time 12   ? Period Weeks   ? Status On-going   ? Target Date 11/30/21   ?  ? PT LONG TERM GOAL #3  ? Title Pt will decrease DHI score by at least 18 points in order to demonstrate clinically significant reduction in disability   ? Baseline 54 on 09/07/21 72on 10/26/21   Not reassessed, but reports no significant change/improvement with dizziness  ? Time 12   ? Period Weeks   ? Status New   ?  ? PT LONG TERM GOAL #4  ? Title Patient will increase 10 meter walk test to >1.59ms as to improve gait speed for better community ambulation and to reduce fall risk.   ? Baseline .41 on 09/07/21 with SPC   ? Time 12   ? Period Weeks   ? Status Partially Met   ?  ? PT LONG TERM GOAL #5  ? Title Patient will increase FOTO score to equal to or greater than 57 to  demonstrate statistically significant improvement in mobility and quality of life.   ? Baseline 45 on 09/07/21 41 on 10/26/21 ( pt having recent knee buckiling)   ? Time 12   ? Period Weeks   ? Status New   ?

## 2021-11-30 ENCOUNTER — Ambulatory Visit: Payer: 59 | Admitting: Physical Therapy

## 2021-11-30 ENCOUNTER — Ambulatory Visit: Payer: 59

## 2021-11-30 DIAGNOSIS — M545 Low back pain, unspecified: Secondary | ICD-10-CM

## 2021-11-30 DIAGNOSIS — R262 Difficulty in walking, not elsewhere classified: Secondary | ICD-10-CM

## 2021-11-30 DIAGNOSIS — R2689 Other abnormalities of gait and mobility: Secondary | ICD-10-CM

## 2021-11-30 DIAGNOSIS — R2681 Unsteadiness on feet: Secondary | ICD-10-CM

## 2021-11-30 DIAGNOSIS — M6281 Muscle weakness (generalized): Secondary | ICD-10-CM

## 2021-11-30 DIAGNOSIS — R278 Other lack of coordination: Secondary | ICD-10-CM

## 2021-12-01 ENCOUNTER — Ambulatory Visit: Payer: 59

## 2021-12-01 NOTE — Therapy (Signed)
[facility] ?[place], [place], [address] ?Phone: [phone]   Fax:  [phone] ? ?Physical Therapy Treatment/PT Discharge summary ? ?Patient Details  ?Name: Vanessa Medina ?MRN: [id number] ?Date of Birth: September 02, 1971 ?No data recorded ? ?Encounter Date: 11/30/2021 ? ? PT End of Session - 11/30/21 1114   ? ? Visit Number 17   ? Number of Visits 24   ? Date for PT Re-Evaluation 11/30/21   ? Authorization Type UHC Other   ? Authorization Time Period 01/24/21-04/18/2021   ? PT Start Time 1602   ? PT Stop Time 7989   ? PT Time Calculation (min) 52 min   ? Equipment Utilized During Treatment Gait belt   ? Activity Tolerance Patient tolerated treatment well   ? Behavior During Therapy [facility] for tasks assessed/performed   ? ?  ?  ? ?  ? ? ?Past Medical History:  ?Diagnosis Date  ? Anemia   ? Cervical cancer ([place])   ? Family history of adverse reaction to anesthesia   ?  " my mother takes a long time time wake up"  ? GERD (gastroesophageal reflux disease)   ? Headache   ? migraine  ? History of dysplastic nevus 01/18/2018  ? right shoulder, recurrent dysplastic nevus  ? History of shingles   ? Hypertension   ? Migraine   ? Multiple allergies   ? Obesity   ? Osteoarthritis   ? left hip  ? PONV (postoperative nausea and vomiting)   ? Sleep apnea   ? does not wear CPAP  ? Stroke [facility]) 08/11/2020  ? Wears glasses   ? ? ?Past Surgical History:  ?Procedure Laterality Date  ? ABDOMINAL HYSTERECTOMY    ? APPENDECTOMY    ? BACK SURGERY    ? BILIOPANCREATIC DIVERSION    ? with duodenal switch laparoscopic   ? CHOLECYSTECTOMY    ? COLONOSCOPY N/A 06/27/2021  ? Procedure: COLONOSCOPY;  Surgeon: Annamaria Helling, DO;  Location: [facility] ENDOSCOPY;  Service: Gastroenterology;  Laterality: N/A;  ? DIAGNOSTIC LAPAROSCOPY    ? LOA  ? ESOPHAGOGASTRODUODENOSCOPY N/A 06/27/2021  ? Procedure: ESOPHAGOGASTRODUODENOSCOPY (EGD);  Surgeon: Annamaria Helling, DO;  Location:  [facility] ENDOSCOPY;  Service: Gastroenterology;  Laterality: N/A;  ? ESOPHAGOGASTRODUODENOSCOPY (EGD) WITH PROPOFOL N/A 07/25/2017  ? Procedure: ESOPHAGOGASTRODUODENOSCOPY (EGD) WITH PROPOFOL;  Surgeon: Manya Silvas, MD;  Location: [facility] ENDOSCOPY;  Service: Endoscopy;  Laterality: N/A;  ? KNEE ARTHROSCOPY    ? LAPAROSCOPIC GASTRIC SLEEVE RESECTION WITH HIATAL HERNIA REPAIR    ? TONSILLECTOMY    ? TOTAL HIP ARTHROPLASTY Left 01/26/2016  ? Procedure: LEFT TOTAL HIP ARTHROPLASTY ANTERIOR APPROACH;  Surgeon: Leandrew Koyanagi, MD;  Location: [place];  Service: Orthopedics;  Laterality: Left;  ? TRANSFORAMINAL LUMBAR INTERBODY FUSION (TLIF) WITH PEDICLE SCREW FIXATION 3 LEVEL  08/2019  ? Basil Dess, MD L2-L5  ? ? ?There were no vitals filed for this visit. ? ? Subjective Assessment - 11/30/21 1111   ? ? Subjective Patient reports she is trying to walk some on her own and continues to work hard on her strength and endurance. She requests to stop after today as she only has limited number of PT visits and feels they would be best served once she has another procedure on her back which is tenatively planned for later this year.   ? Pertinent History 51 year old female with history of HTN, migraines, morbid obesity--BMI-41, chronic hypotension, multiple back  surgeries with chronic pain with LLE lumbar radiculopathy who was scheduled to have spinal cord stimulator placed by Dr. Davy Pique on 08/11/2020 but unable to perform surgery due to scar tissue.  History taken from patient and chart review.  Post op on awakening, she had excruciating pain RLE with plegia and and allodynia. She was admitted for work up by Dr. Ronnald Ramp from surgical center on 08/11/2020. Thoracic MRI done revealing subtle increased T2/STIR intensity distal cord at T12-L1 level suspicious for edema/acute spinal cord injury, no CSF leak as well as prior PLIF L2-L5 with residual foraminal protrusion L3/5 potentially irritating right L3 nerve. She was started on IV  Dilaudid, gabapentin as well as IV Decadron for management of pain, headaches and severe neuropathy.  She continues to have pain in RLE but is having some motor return, has constant headache, decreased hearing in right> left ear, constipation with nausea as well as epigastric pain and weakness. Therapy ongoing and CIR recommended due to functional decline.  Of note, she reports 50 lbs weigh loss in the past 3-4 months--due to issues with N/V/intake. Has had two falls last month---no recall of incident leading to fall question due to syncope. Was in process of work up for renal disease. She has had issue with LLE weakness with pain for the past year and being followed by Dr. Ernestina Patches. Did well with stimulator trial.   ? Limitations Standing;Walking;House hold activities   ? How long can you sit comfortably? 1 hour (no recent change)   ? How long can you stand comfortably? 3-4 min   ? How long can you walk comfortably? 1/2 block at maximum   ? Patient Stated Goals Improve strength and limit pain   ? Currently in Pain? Yes   ? Pain Score 5    ? Pain Location Back   ? Pain Orientation Right;Left;Posterior;Lower   ? Pain Descriptors / Indicators Aching   ? Pain Type Chronic pain   ? Pain Onset More than a month ago   ? ?  ?  ? ?  ? ? ? ?INTERVENTIONS:  ? ? ?Reassessment of GOALS:  ? ?5 Time Sit to Stand= 17.89 sec with B UE Support (GOAL MET)  ? ?TUG: 21.45 sec without AD and 17.89 with a SPC (GOAL Not met - however patient improve approx 5 sec since 10/2020 and able to perform today without a device as well) . ? ?10 MWT= 0.63 m/s using SPC (improved from 0.41 m/s on 09/07/2021)  ? ? ?FOTO= 51% ? ?6 min walk test= 550 feet without an AD today and then continued walking (outdoor) for over 1000 feet today using her SPC for only last 400 feet- exhibiting short reciprocal steps.  ? ? ?  ? ? ? ? ? ? ? ? ? ? ? ? ? ? ? ? ? PT Education - 12/01/21 1113   ? ? Education Details Review of walking home program and exercise technique   ?  Person(s) Educated Patient   ? Methods Explanation;Demonstration;Tactile cues;Verbal cues   ? Comprehension Verbalized understanding;Returned demonstration;Verbal cues required;Tactile cues required;Need further instruction   ? ?  ?  ? ?  ? ? ? PT Short Term Goals - 10/26/21 1612   ? ?  ? PT SHORT TERM GOAL #1  ? Title Pt will be independent with HEP in order to improve strength and balance in order to decrease fall risk and improve function at home and work.   ? Baseline Pt performing HEP daily  at this time and trying to walk in hallway throughout the day   ? Time 3   ? Period Weeks   ? Status Achieved   ? Target Date 09/28/21   ? ?  ?  ? ?  ? ? ? ? PT Long Term Goals - 11/30/21 1611   ? ?  ? PT LONG TERM GOAL #1  ? Title Patient will complete five times sit to stand test in < 20 seconds indicating an increased LE strength, power, and improved balance.   ? Baseline 36.86 sec 09/07/21 10/26/21: 26.6; 11/30/2021= 17.89 with BUE Support   ? Time 12   ? Period Weeks   ? Status Achieved   ? Target Date 11/30/21   ?  ? PT LONG TERM GOAL #2  ? Title Pt will decrease TUG to below 14 seconds/decrease in order to demonstrate decreased fall risk.   ? Baseline TUG 22.99sec 2/22: 21s; 11/30/2021= 21.45 sec without AD and 17.89 sec with a SPC   ? Time 12   ? Period Weeks   ? Status Not Met   ? Target Date 11/30/21   ?  ? PT LONG TERM GOAL #3  ? Title Pt will decrease DHI score by at least 18 points in order to demonstrate clinically significant reduction in disability   ? Baseline 54 on 09/07/21 72on 10/26/21   Not reassessed, but reports no significant change/improvement with dizziness  ? Time 12   ? Period Weeks   ? Status Achieved   ?  ? PT LONG TERM GOAL #4  ? Title Patient will increase 10 meter walk test to >1.36ms as to improve gait speed for better community ambulation and to reduce fall risk.   ? Baseline .41 on 09/07/21 with SPC 11/30/2021= 0.63 m/s   ? Time 12   ? Period Weeks   ? Status Not Met   ?  ? PT LONG TERM GOAL #5  ?  Title Patient will increase FOTO score to equal to or greater than 57 to demonstrate statistically significant improvement in mobility and quality of life.   ? Baseline 45 on 09/07/21 41 on 10/26/21 ( pt having rec

## 2021-12-05 ENCOUNTER — Ambulatory Visit: Payer: 59

## 2021-12-05 ENCOUNTER — Encounter: Payer: Self-pay | Admitting: Physical Medicine and Rehabilitation

## 2021-12-07 ENCOUNTER — Ambulatory Visit: Payer: 59

## 2021-12-12 ENCOUNTER — Ambulatory Visit: Payer: 59

## 2021-12-14 ENCOUNTER — Ambulatory Visit: Payer: 59

## 2021-12-19 ENCOUNTER — Ambulatory Visit: Payer: 59

## 2021-12-21 ENCOUNTER — Ambulatory Visit: Payer: 59

## 2021-12-21 DIAGNOSIS — H9313 Tinnitus, bilateral: Secondary | ICD-10-CM | POA: Insufficient documentation

## 2021-12-25 ENCOUNTER — Encounter: Payer: Self-pay | Admitting: Cardiology

## 2021-12-26 ENCOUNTER — Ambulatory Visit: Payer: 59

## 2021-12-28 ENCOUNTER — Ambulatory Visit: Payer: 59 | Admitting: Allergy

## 2021-12-28 ENCOUNTER — Ambulatory Visit: Payer: 59

## 2022-01-02 ENCOUNTER — Ambulatory Visit: Payer: 59

## 2022-01-04 ENCOUNTER — Ambulatory Visit: Payer: 59

## 2022-01-09 ENCOUNTER — Ambulatory Visit: Payer: 59

## 2022-01-11 ENCOUNTER — Ambulatory Visit: Payer: 59

## 2022-01-16 ENCOUNTER — Ambulatory Visit: Payer: 59

## 2022-01-18 ENCOUNTER — Ambulatory Visit: Payer: 59

## 2022-01-23 ENCOUNTER — Ambulatory Visit: Payer: 59

## 2022-01-25 ENCOUNTER — Ambulatory Visit: Payer: 59

## 2022-02-01 ENCOUNTER — Ambulatory Visit: Payer: 59

## 2022-02-06 ENCOUNTER — Ambulatory Visit: Payer: 59

## 2022-02-08 ENCOUNTER — Ambulatory Visit: Payer: 59

## 2022-02-13 ENCOUNTER — Ambulatory Visit: Payer: 59

## 2022-02-15 ENCOUNTER — Ambulatory Visit: Payer: 59

## 2022-02-19 ENCOUNTER — Other Ambulatory Visit: Payer: Self-pay | Admitting: Specialist

## 2022-02-20 ENCOUNTER — Ambulatory Visit: Payer: 59

## 2022-02-22 ENCOUNTER — Encounter: Payer: Self-pay | Admitting: Physical Medicine and Rehabilitation

## 2022-02-22 ENCOUNTER — Encounter: Payer: 59 | Attending: Physical Medicine and Rehabilitation | Admitting: Physical Medicine and Rehabilitation

## 2022-02-22 ENCOUNTER — Ambulatory Visit: Payer: 59

## 2022-02-22 VITALS — BP 122/79 | HR 64 | Ht 64.0 in | Wt 238.0 lb

## 2022-02-22 DIAGNOSIS — G8220 Paraplegia, unspecified: Secondary | ICD-10-CM | POA: Diagnosis present

## 2022-02-22 DIAGNOSIS — R269 Unspecified abnormalities of gait and mobility: Secondary | ICD-10-CM

## 2022-02-22 DIAGNOSIS — M7918 Myalgia, other site: Secondary | ICD-10-CM

## 2022-02-22 DIAGNOSIS — H938X3 Other specified disorders of ear, bilateral: Secondary | ICD-10-CM

## 2022-02-22 NOTE — Patient Instructions (Signed)
Pt is a 51 yr old female with paraparesis/incomplete paraplegia due to attempted spinal cord stimulator placement- With chronic hx of orthostasis, BMI of 41 - back dwon from 45-  s/p gastric sleeve, Acute on chronic renal issues, here for f/u on SCI and neurogenic orthostatic hypotension. Vestibular issues due to trigger points and central issues.   Suggest stopping at Neurologist office- and seeing if they can give explanation as to why cannot fill out pre-op clearance. We discussed this could be due to phrasing of paperwork.    2.  Suggest using rollator to get around to reduce falls.   3. Patient here for trigger point injections for  Consent done and on chart.  Cleaned areas with alcohol and injected using a 27 gauge 1.5 inch needle  Injected 3cc Using 1% Lidocaine with no EPI  Upper traps B/L  Levators- B/L  Posterior scalenes Middle scalenes- B/L - L side had a tiny surface hematoma Splenius Capitus Pectoralis Major- B/L  Rhomboids B/L  Infraspinatus Teres Major/minor Thoracic paraspinals Lumbar paraspinals- B/L  Other injections-     There was no bleeding or complications.  Patient was advised to drink a lot of water on day after injections to flush system Will have increased soreness for 12-48 hours after injections.  Can use Lidocaine patches the day AFTER injections Can use theracane on day of injections in places didn't inject Can use heating pad 4-6 hours AFTER injections   4. Pt would be a perfect candidate for disability due to her vertigo, her RLE weakness and chornic pain/impairing her gait and frequent falls.    5. Just got all refills, so doesn't need refills on Cymbalta, baclofen, and midodrine.    6. F/U in 3 months- double appt with Trigger point injections.

## 2022-02-22 NOTE — Progress Notes (Signed)
Subjective:    Patient ID: Vanessa Medina, female    DOB: 02-01-1971, 51 y.o.   MRN: [id number]  HPI Pt is a 51 yr old female with paraparesis/incomplete paraplegia due to attempted spinal cord stimulator placement- With chronic hx of orthostasis, BMI of 41 - back dwon from 77-  s/p gastric sleeve, Acute on chronic renal issues, here for f/u on SCI and neurogenic orthostatic hypotension. Vestibular issues due to trigger points and central issues.    Unemployed Is worried about this.   Pain is getting worse in her back down her R leg- The numbness/tingling is worse.   Dr Patrice Paradise is going to go in and replace the screws.   Problem is the Dr Serita Grit office- Neurology-- refuse to sign off on pre op clearance. Doesn't give explanation as to why.   Sees Dr Patrice Paradise next week to see if can actually have surgery go forward.  Walking with single point cane R leg still drags.  Falling a lot more- 6x in last month- Because R leg just not picking up Not using rolator-  Doesn't go out anymore because of falling.    Wants trigger point injections today.  Due to pain.   Cut back portion size and water only and cut back sugars and doesn't eat after 6pm- cut out bread/pasta- lost from 260 to 238 lbs.   Working with [employer] disability on trying to get a job.  Sitting still kills her a lot- up and down a LOT Cannot find a comfortable spot.  Hurts mostly over coccyx In midline RLE feels "dead asleep"   B/L vertigo much better- some days are better; some days worse, but overall better  Pain Inventory Average Pain 8 Pain Right Now 5 My pain is sharp, dull, stabbing, tingling, and aching  LOCATION OF PAIN  neck, back, buttocks, knee, leg  BOWEL Number of stools per week: 7-10 Oral laxative use No  Type of laxative . Enema or suppository use No  History of colostomy No  Incontinent No   BLADDER Normal In and out cath, frequency . Able to self cath  . Bladder incontinence No  Frequent  urination No  Leakage with coughing No  Difficulty starting stream No  Incomplete bladder emptying No    Mobility walk with assistance use a cane how many minutes can you walk? 5-7 ability to climb steps?  yes do you drive?  yes  Function not employed: date last employed 12/21/21  Neuro/Psych numbness tingling trouble walking spasms  Prior Studies Any changes since last visit?  no  Physicians involved in your care Any changes since last visit?  no   Family History  Problem Relation Age of Onset   Renal Disease Mother    Hypertension Mother    Sudden Cardiac Death Mother    Heart failure Mother    Valvular heart disease Mother    Heart disease Mother    Stroke Brother    Heart attack Brother 33   Breast cancer Cousin        paternal side   Diabetes Other    Social History   Socioeconomic History   Marital status: Divorced    Spouse name: Not on file   Number of children: 1   Years of education: Not on file   Highest education level: Not on file  Occupational History   Not on file  Tobacco Use   Smoking status: Never   Smokeless tobacco: Never  Vaping Use  Vaping Use: Never used  Substance and Sexual Activity   Alcohol use: Yes    Comment: occasional wine   Drug use: No   Sexual activity: Not Currently  Other Topics Concern   Not on file  Social History Narrative   Right handed    Uses cane to walk    Social Determinants of Health   Financial Resource Strain: Not on file  Food Insecurity: Not on file  Transportation Needs: Not on file  Physical Activity: Not on file  Stress: Not on file  Social Connections: Not on file   Past Surgical History:  Procedure Laterality Date   ABDOMINAL HYSTERECTOMY     APPENDECTOMY     BACK SURGERY     BILIOPANCREATIC DIVERSION     with duodenal switch laparoscopic    CHOLECYSTECTOMY     COLONOSCOPY N/A 06/27/2021   Procedure: COLONOSCOPY;  Surgeon: Annamaria Helling, DO;  Location: [facility];   Service: Gastroenterology;  Laterality: N/A;   DIAGNOSTIC LAPAROSCOPY     LOA   ESOPHAGOGASTRODUODENOSCOPY N/A 06/27/2021   Procedure: ESOPHAGOGASTRODUODENOSCOPY (EGD);  Surgeon: Annamaria Helling, DO;  Location[facility] ENDOSCOPY;  Service: Gastroenterology;  Laterality: N/A;   ESOPHAGOGASTRODUODENOSCOPY (EGD) WITH PROPOFOL N/A 07/25/2017   Procedure: ESOPHAGOGASTRODUODENOSCOPY (EGD) WITH PROPOFOL;  Surgeon: Manya Silvas, MD;  Location: [facility] ENDOSCOPY;  Service: Endoscopy;  Laterality: N/A;   KNEE ARTHROSCOPY     LAPAROSCOPIC GASTRIC SLEEVE RESECTION WITH HIATAL HERNIA REPAIR     TONSILLECTOMY     TOTAL HIP ARTHROPLASTY Left 01/26/2016   Procedure: LEFT TOTAL HIP ARTHROPLASTY ANTERIOR APPROACH;  Surgeon: Leandrew Koyanagi, MD;  Location: [facility];  Service: Orthopedics;  Laterality: Left;   TRANSFORAMINAL LUMBAR INTERBODY FUSION (TLIF) WITH PEDICLE SCREW FIXATION 3 LEVEL  08/2019   Basil Dess, MD L2-L5   Past Medical History:  Diagnosis Date   Anemia    Cervical cancer [facility])    Family history of adverse reaction to anesthesia     " my mother takes a long time time wake up"   GERD (gastroesophageal reflux disease)    Headache    migraine   History of dysplastic nevus 01/18/2018   right shoulder, recurrent dysplastic nevus   History of shingles    Hypertension    Migraine    Multiple allergies    Obesity    Osteoarthritis    left hip   PONV (postoperative nausea and vomiting)    Sleep apnea    does not wear CPAP   Stroke ([place]) 08/11/2020   Wears glasses    BP 122/79   Pulse 64   Ht '5\' 4"'$  (1.626 m)   Wt 238 lb (108 kg)   SpO2 96%   BMI 40.85 kg/m   Opioid Risk Score:   Fall Risk Score:  `1  Depression screen [facility] 2/9     02/22/2022    9:48 AM 11/02/2021   11:32 AM 07/27/2021   12:12 PM 04/29/2021    3:01 PM 02/25/2021    1:58 PM 10/18/2020    2:40 PM 09/15/2020    2:42 PM  Depression screen PHQ 2/9  Decreased Interest 0 0 0 0 0 0 0  Down, Depressed, Hopeless 0 0 0  0 0 0 0  PHQ - 2 Score 0 0 0 0 0 0 0  Altered sleeping       0  Tired, decreased energy       0  Change in appetite  0  Feeling bad or failure about yourself        0  Trouble concentrating       0  Moving slowly or fidgety/restless       1  Suicidal thoughts       0  PHQ-9 Score       1  Difficult doing work/chores       Somewhat difficult      Review of Systems  Musculoskeletal:  Positive for back pain, gait problem and neck pain.       Knee pain Leg pain spasms  Neurological:  Positive for numbness.  All other systems reviewed and are negative.     Objective:   Physical Exam  Awake, alert, appropriate, depressed affect; using single point cane to walk and R knee sleeve; NAD  MS: RLE- HF 4-/5; KE- hard to test due to pain-but appears to be ~ 3-4/5; KF same; DF 3-/5; PF 4-/5 LLE- HF 4+/5; KE/KF 4+/5; DF/PF 4+/5   TTP over coccyx- midline- not over piriformis or SI joint B/L - No tightness in hamstrings B/L   Neuro: Legs shaking with ROM- hard to tell is spasms vs pain- No clonus B/L  Decreased to light touch in RLE- from L1- to S1 on R      Assessment & Plan:   Pt is a 51 yr old female with paraparesis/incomplete paraplegia due to attempted spinal cord stimulator placement- With chronic hx of orthostasis, BMI of 41 - back dwon from 44-  s/p gastric sleeve, Acute on chronic renal issues, here for f/u on SCI and neurogenic orthostatic hypotension. Vestibular issues due to trigger points and central issues.   Suggest stopping at Neurologist office- and seeing if they can give explanation as to why cannot fill out pre-op clearance. We discussed this could be due to phrasing of paperwork.    2.  Suggest using rollator to get around to reduce falls.   3. Patient here for trigger point injections for  Consent done and on chart.  Cleaned areas with alcohol and injected using a 27 gauge 1.5 inch needle  Injected 3cc Using 1% Lidocaine with no EPI  Upper traps  B/L  Levators- B/L  Posterior scalenes Middle scalenes- B/L - L side had a tiny surface hematoma Splenius Capitus Pectoralis Major- B/L  Rhomboids B/L  Infraspinatus Teres Major/minor Thoracic paraspinals Lumbar paraspinals- B/L  Other injections-     There was no bleeding or complications.  Patient was advised to drink a lot of water on day after injections to flush system Will have increased soreness for 12-48 hours after injections.  Can use Lidocaine patches the day AFTER injections Can use theracane on day of injections in places didn't inject Can use heating pad 4-6 hours AFTER injections   4. Pt would be a perfect candidate for disability due to her vertigo, her RLE weakness and chornic pain/impairing her gait and frequent falls.    5. Just got all refills, so doesn't need refills on Cymbalta, baclofen, and midodrine.    6. F/U in 3 months- double appt with Trigger point injections.   I spent a total of 34   minutes on total care today- >50% coordination of care- due to 10 minutes on injections and rest discussing disability and what to do to get surgery done if possible.

## 2022-03-10 NOTE — Progress Notes (Signed)
Primary Physician/Referring:  Maryland Pink, MD  Patient ID: Vanessa Medina, female    DOB: May 28, 1971, 51 y.o.   MRN: [id number]  Chief Complaint  Patient presents with   Neurogenic orthostatic hypotension   Follow-up    6 month   Results    HPI:    Vanessa Medina  is a 51 y.o. with family history of premature CAD (mother's first MI in her 97s), chronic dizziness, morbid obesity, history of gastric sleeve procedure in 2015 and revision in 2019. Previously seen by Dr. Ida Rogue, and originally referred to our office for evaluation of postural orthostatic tachycardia syndrome, however review of her record shows patient does not have POTS.  Rather patient only has tachycardia component of POTS.    Patient symptoms of orthostasis started approximately 5 years ago and may be related to underlying nutritional deficiency following bariatric procedure, polypharmacy as well.   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    Patient was last seen in the office 09/12/2021 at which time patient's orthostatic symptoms were stable with midodrine 10 mg 3 times daily and Florinef 0.1 mg twice daily, however due to palpitations ordered 2-week cardiac monitor which revealed no significant arrhythmias but rather symptomatic PACs.  Patient also underwent coronary calcium scoring with total score of 0.  She now presents for 67-monthfollow-up. Patient continues to feel better and better. She has rare brief palpitations.  She continues to work hard to lose weight, has lost approximately 47 pounds in total. Denies dyspnea, syncope, near syncope, orthopnea, PND.  Regard to patient's orthostasis, she reports symptoms are well controlled and blood pressure and heart rate have both  remained stable since last office visit.  Past Medical History:  Diagnosis Date   Anemia    [place]    Family history of adverse reaction to anesthesia     " my mother takes a long time time wake up"   GERD (gastroesophageal reflux disease)    Headache    migraine   History of dysplastic nevus 01/18/2018   right shoulder, recurrent dysplastic nevus   History of shingles    Hypertension    Migraine    Multiple allergies    Obesity    Osteoarthritis    left hip   PONV (postoperative nausea and vomiting)    Sleep apnea    does not wear CPAP   [place]   Wears glasses    Past Surgical History:  Procedure Laterality Date   ABDOMINAL HYSTERECTOMY     APPENDECTOMY     BACK SURGERY     BILIOPANCREATIC DIVERSION     with duodenal switch laparoscopic    CHOLECYSTECTOMY     COLONOSCOPY N/A 06/27/2021   Procedure: COLONOSCOPY;  Surgeon: RAnnamaria Helling DO;  [facility]  Service: Gastroenterology;  Laterality: N/A;   DIAGNOSTIC LAPAROSCOPY     LOA   ESOPHAGOGASTRODUODENOSCOPY N/A 06/27/2021   Procedure: ESOPHAGOGASTRODUODENOSCOPY (EGD);  Surgeon: RAnnamaria Helling DO;  [facility];  Service: Gastroenterology;  Laterality: N/A;   ESOPHAGOGASTRODUODENOSCOPY (EGD) WITH PROPOFOL N/A 07/25/2017   Procedure: ESOPHAGOGASTRODUODENOSCOPY (EGD) WITH PROPOFOL;  Surgeon: EManya Silvas MD;  [facility];  Service: Endoscopy;  Laterality: N/A;   KNEE ARTHROSCOPY  LAPAROSCOPIC GASTRIC SLEEVE RESECTION WITH HIATAL HERNIA REPAIR     TONSILLECTOMY     TOTAL HIP ARTHROPLASTY Left 01/26/2016   Procedure: LEFT TOTAL HIP ARTHROPLASTY ANTERIOR APPROACH;  Surgeon: Leandrew Koyanagi, MD;  Location: [facility];  Service: Orthopedics;  Laterality: Left;   TRANSFORAMINAL LUMBAR INTERBODY FUSION (TLIF) WITH PEDICLE SCREW FIXATION 3 LEVEL  08/2019   Basil Dess, MD L2-L5   Family History  Problem Relation Age of Onset   Renal  Disease Mother    Hypertension Mother    Sudden Cardiac Death Mother    Heart failure Mother    Valvular heart disease Mother    Heart disease Mother    Stroke Brother    Heart attack Brother 73   Breast cancer Cousin        paternal side   Diabetes Other     Social History   Tobacco Use   Smoking status: Never   Smokeless tobacco: Never  Substance Use Topics   Alcohol use: Yes    Comment: occasional wine   Marital Status: Divorced   ROS  Review of Systems  Cardiovascular:  Negative for chest pain, claudication, dyspnea on exertion, leg swelling, near-syncope, orthopnea, palpitations, paroxysmal nocturnal dyspnea and syncope.  Respiratory:  Negative for shortness of breath.   Hematologic/Lymphatic: Does not bruise/bleed easily.  Musculoskeletal:  Positive for arthritis, back pain and muscle weakness.  Gastrointestinal:  Negative for melena.  Neurological:  Positive for dizziness (vertigo). Negative for weakness.   Objective  Blood pressure 132/79, pulse 74, temperature 98 F (36.7 C), resp. rate 16, height '5\' 4"'$  (1.626 m), weight 237 lb (107.5 kg), SpO2 96 %. Body mass index is 40.68 kg/m.      03/20/2022   12:54 PM 02/22/2022    9:44 AM 11/02/2021   11:27 AM  Vitals with BMI  Height '5\' 4"'$  '5\' 4"'$  '5\' 4"'$   Weight 237 lbs 238 lbs 241 lbs  BMI 40.66 26.83 41.96  Systolic 222 979 892  Diastolic 79 79 73  Pulse 74 64 88   Orthostatic VS for the past 72 hrs (Last 3 readings):  Patient Position BP Location Cuff Size  03/20/22 1254 Sitting Left Arm Large      Physical Exam Vitals reviewed.  Constitutional:      Comments: Morbidly obese in no acute distress. Ambulating with cane   Cardiovascular:     Rate and Rhythm: Normal rate and regular rhythm.     Pulses: Intact distal pulses.          Carotid pulses are 2+ on the right side and 2+ on the left side.      Dorsalis pedis pulses are 2+ on the right side and 2+ on the left side.       Posterior tibial pulses are 2+  on the right side and 2+ on the left side.     Heart sounds: Normal heart sounds, S1 normal and S2 normal. No murmur heard.    No gallop.     Comments: Femoral and popliteal pulse difficult to feel due to patient's body habitus.  JVD difficult to see due to short neck. Pulmonary:     Effort: Pulmonary effort is normal. No respiratory distress.     Breath sounds: Normal breath sounds. No wheezing, rhonchi or rales.  Abdominal:     Comments: Obese. Pannus present  Musculoskeletal:     Right lower leg: No edema.     Left lower leg: No edema.  Laboratory examination:   No results for input(s): "NA", "K", "CL", "CO2", "GLUCOSE", "BUN", "CREATININE", "CALCIUM", "GFRNONAA", "GFRAA" in the last 8760 hours.  CrCl cannot be calculated (Patient's most recent lab result is older than the maximum 21 days allowed.).     Latest Ref Rng & Units 08/30/2020    4:59 AM 08/26/2020    4:07 AM 08/24/2020    4:20 AM  CMP  Glucose 70 - 99 mg/dL 92  92  83   BUN 6 - 20 mg/dL '13  18  23   '$ Creatinine 0.44 - 1.00 mg/dL 0.95  0.99  0.98   Sodium 135 - 145 mmol/L 139  139  139   Potassium 3.5 - 5.1 mmol/L 3.6  3.7  3.9   Chloride 98 - 111 mmol/L 109  107  109   CO2 22 - 32 mmol/L '20  22  20   '$ Calcium 8.9 - 10.3 mg/dL 8.8  8.9  9.3       Latest Ref Rng & Units 08/30/2020    4:59 AM 08/23/2020    6:19 AM 08/19/2020    4:40 AM  CBC  WBC 4.0 - 10.5 K/uL 4.4  7.4  8.6   Hemoglobin 12.0 - 15.0 g/dL 9.3  10.3  10.8   Hematocrit 36.0 - 46.0 % 28.0  32.1  32.8   Platelets 150 - 400 K/uL 202  170  164     Lipid Panel No results for input(s): "CHOL", "TRIG", "LDLCALC", "VLDL", "HDL", "CHOLHDL", "LDLDIRECT" in the last 8760 hours.  HEMOGLOBIN A1C Lab Results  Component Value Date   HGBA1C 5.1 06/24/2013   TSH No results for input(s): "TSH" in the last 8760 hours.   External labs:  None  Allergies   Allergies  Allergen Reactions   Shellfish Allergy Anaphylaxis   Meperidine Hcl Other (See  Comments)    BRADYCARDIA   Morphine Other (See Comments)    BRADYCARDIA   Acetaminophen Itching   Bee Pollen Other (See Comments)    Unknown   Ivp Dye [Iodinated Contrast Media] Other (See Comments)    Swelling    Oxycodone Hives and Itching    Blisters on back   Pentazocine Nausea Only and Nausea And Vomiting   Codeine Nausea And Vomiting   Oxycodone-Acetaminophen Itching   Pentazocine Lactate Nausea And Vomiting    REACTION: vomiting with [place]win NX   Propoxyphene Nausea Only and Nausea And Vomiting   Propoxyphene N-Acetaminophen Nausea And Vomiting    Medications Prior to Visit:   Outpatient Medications Prior to Visit  Medication Sig Dispense Refill   aspirin EC 81 MG tablet Take 81 mg by mouth daily. Swallow whole.     azelastine (ASTELIN) 0.1 % nasal spray Place into the nose.     baclofen (LIORESAL) 10 MG tablet Take 1 tablet (10 mg total) by mouth 3 (three) times daily. IF NEEDED- for spasms 270 tablet 3   diclofenac Sodium (VOLTAREN) 1 % GEL APPLY 4 GRAMS TO AFFECTED  AREA(S) TOPICALLY 4 TIMES DAILY 350 g 4   DULoxetine (CYMBALTA) 60 MG capsule Take 60 mg by mouth daily.     EPINEPHrine 0.3 mg/0.3 mL IJ SOAJ injection SMARTSIG:0.3 Milliliter(s) IM Once PRN     fludrocortisone (FLORINEF) 0.1 MG tablet Take 1 tablet (0.1 mg total) by mouth 2 (two) times daily. 60 tablet 5   fluticasone (FLONASE) 50 MCG/ACT nasal spray Place 1-2 sprays into both nostrils daily.     methocarbamol (ROBAXIN) 500 MG tablet  TAKE 1 TABLET BY MOUTH  EVERY 12 HOURS AS NEEDED  FOR MUSCLE SPASMS 30 tablet 0   midodrine (PROAMATINE) 10 MG tablet Take 1 tablet (10 mg total) by mouth 3 (three) times daily. While awake. Can take one additional if dizziness 270 tablet 3   montelukast (SINGULAIR) 10 MG tablet Take 1 tablet (10 mg total) by mouth at bedtime. For allergies 90 tablet 3   Multiple Vitamin (MULTIVITAMIN WITH MINERALS) TABS tablet Take 1 tablet by mouth 2 (two) times daily.     MYRBETRIQ 25 MG TB24  tablet Take 25 mg by mouth daily.     pramipexole (MIRAPEX) 0.125 MG tablet Take by mouth.     pyridOXINE (VITAMIN B-6) 100 MG tablet Take 1 tablet (100 mg total) by mouth daily. 90 tablet 1   Thiamine HCl (VITAMIN B-1) 250 MG tablet Take 1 tablet (250 mg total) by mouth daily. 90 tablet 1   topiramate (TOPAMAX) 100 MG tablet TAKE 1 TABLET(100 MG) BY MOUTH AT BEDTIME 90 tablet 3   TRULICITY 1.5 WV/3.7TG SOPN Inject 1.5 mg into the skin once a week.     vitamin B-12 (CYANOCOBALAMIN) 1000 MCG tablet Take 1 tablet (1,000 mcg total) by mouth daily. 90 tablet 1   gabapentin (NEURONTIN) 300 MG capsule TAKE 1 CAPSULE BY MOUTH  EVERY NIGHT AT BEDTIME 90 capsule 4   gabapentin (NEURONTIN) 400 MG capsule TAKE 3 CAPSULES(1200 MG) BY MOUTH AT BEDTIME 90 capsule 5   No facility-administered medications prior to visit.   Final Medications at End of Visit    Current Meds  Medication Sig   aspirin EC 81 MG tablet Take 81 mg by mouth daily. Swallow whole.   azelastine (ASTELIN) 0.1 % nasal spray Place into the nose.   baclofen (LIORESAL) 10 MG tablet Take 1 tablet (10 mg total) by mouth 3 (three) times daily. IF NEEDED- for spasms   diclofenac Sodium (VOLTAREN) 1 % GEL APPLY 4 GRAMS TO AFFECTED  AREA(S) TOPICALLY 4 TIMES DAILY   DULoxetine (CYMBALTA) 60 MG capsule Take 60 mg by mouth daily.   EPINEPHrine 0.3 mg/0.3 mL IJ SOAJ injection SMARTSIG:0.3 Milliliter(s) IM Once PRN   fludrocortisone (FLORINEF) 0.1 MG tablet Take 1 tablet (0.1 mg total) by mouth 2 (two) times daily.   fluticasone (FLONASE) 50 MCG/ACT nasal spray Place 1-2 sprays into both nostrils daily.   methocarbamol (ROBAXIN) 500 MG tablet TAKE 1 TABLET BY MOUTH  EVERY 12 HOURS AS NEEDED  FOR MUSCLE SPASMS   midodrine (PROAMATINE) 10 MG tablet Take 1 tablet (10 mg total) by mouth 3 (three) times daily. While awake. Can take one additional if dizziness   montelukast (SINGULAIR) 10 MG tablet Take 1 tablet (10 mg total) by mouth at bedtime. For  allergies   Multiple Vitamin (MULTIVITAMIN WITH MINERALS) TABS tablet Take 1 tablet by mouth 2 (two) times daily.   MYRBETRIQ 25 MG TB24 tablet Take 25 mg by mouth daily.   pramipexole (MIRAPEX) 0.125 MG tablet Take by mouth.   pyridOXINE (VITAMIN B-6) 100 MG tablet Take 1 tablet (100 mg total) by mouth daily.   Thiamine HCl (VITAMIN B-1) 250 MG tablet Take 1 tablet (250 mg total) by mouth daily.   topiramate (TOPAMAX) 100 MG tablet TAKE 1 TABLET(100 MG) BY MOUTH AT BEDTIME   TRULICITY 1.5 GY/6.9SW SOPN Inject 1.5 mg into the skin once a week.   vitamin B-12 (CYANOCOBALAMIN) 1000 MCG tablet Take 1 tablet (1,000 mcg total) by mouth daily.  Radiology:   No results found.  Cardiac Studies:  CORONARY CALCIUM SCORES 09/28/2021:  Left Main: 0  LAD: 0  LCx: 0  RCA: 0  Total Agatston Score: 0  MESA database percentile: 0   AORTA MEASUREMENTS:  Ascending Aorta: 25 mm  Descending Aorta: 22 mm  Ambulatory cardiac telemetry 14 days (09/12/2021 - 09/26/2021): Predominant underlying rhythm was sinus.  Minimum heart rate 52 bpm, maximum heart rate 150 bpm, average heart rate 77 bpm.  Rare PACs and PVCs.  Reported SVT episode more consistent with sinus tachycardia.  Patient's symptoms correlated with PACs and sinus rhythm 70-123 bpm.  No evidence of atrial fibrillation, high degree AV block, pauses >3 seconds, VT, or SVT.  Echocardiogram 09/28/2021:  Normal LV systolic function with visual EF 60-65%. Left ventricle cavity  is normal in size. Normal left ventricular wall thickness. Normal global  wall motion. Normal diastolic filling pattern, normal LAP.  No significant valvular heart disease.  Compared to study 08/14/2018 no significant change.   EKG:  03/20/2022: Sinus rhythm at a rate of 62 bpm.  Normal axis.  Poor progression, cannot exclude anteroseptal infarct old.  No evidence of ischemia underlying injury pattern.  Compared EKG 09/12/2021, no significant change.  Assessment     ICD-10-CM    1. Neurogenic orthostatic hypotension (HCC)  G90.3 EKG 12-Lead    2. Palpitations  R00.2        Medications Discontinued During This Encounter  Medication Reason   gabapentin (NEURONTIN) 300 MG capsule    gabapentin (NEURONTIN) 400 MG capsule      No orders of the defined types were placed in this encounter.   Orders Placed This Encounter  Procedures   EKG 12-Lead     Recommendations:   PINKIE MANGER is a 51 y.o. with chronic dizziness, morbid obesity, history of gastric sleeve procedure in 2015 and revision in 2019. Previously seen by Dr. Ida Rogue, and originally referred to our office for evaluation of postural orthostatic tachycardia syndrome, however review of her record shows patient does not have POTS.  Rather patient only has tachycardia component of POTS.    Patient symptoms of orthostasis started approximately 5 years ago and may be related to underlying nutritional deficiency following bariatric procedure, polypharmacy as well.   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    Patient was last seen in the office 09/12/2021 at which time patient's orthostatic symptoms were stable with midodrine 10 mg 3 times daily and Florinef 0.2 mg daily, however due to palpitations ordered 2-week cardiac monitor which revealed no significant arrhythmias but rather symptomatic PACs.  Patient also underwent coronary calcium scoring with total score of 0.  She now presents for 79-monthfollow-up.  Patient continues to have occasional brief palpitations.  Reviewed and discussed monitor results and coronary calcium scoring, details above.  There is no clinical evidence of heart failure and patient's orthostatic symptoms remain stable.  Will not make changes to  medications at this time.  Also discussed echocardiogram which revealed preserved LVEF and no significant valvular disease.  Follow-up in 1 year, sooner if needed.   CAlethia Berthold PA-C 03/20/2022, 1:26 PM Office: [phone]

## 2022-03-13 ENCOUNTER — Ambulatory Visit: Payer: 59 | Admitting: Student

## 2022-03-20 ENCOUNTER — Encounter: Payer: Self-pay | Admitting: Student

## 2022-03-20 ENCOUNTER — Ambulatory Visit: Payer: 59 | Admitting: Student

## 2022-03-20 VITALS — BP 132/79 | HR 74 | Temp 98.0°F | Resp 16 | Ht 64.0 in | Wt 237.0 lb

## 2022-03-20 DIAGNOSIS — R002 Palpitations: Secondary | ICD-10-CM

## 2022-03-20 DIAGNOSIS — G903 Multi-system degeneration of the autonomic nervous system: Secondary | ICD-10-CM

## 2022-03-28 ENCOUNTER — Ambulatory Visit (INDEPENDENT_AMBULATORY_CARE_PROVIDER_SITE_OTHER): Payer: 59 | Admitting: Orthopaedic Surgery

## 2022-03-28 ENCOUNTER — Ambulatory Visit (INDEPENDENT_AMBULATORY_CARE_PROVIDER_SITE_OTHER): Payer: 59

## 2022-03-28 DIAGNOSIS — M1711 Unilateral primary osteoarthritis, right knee: Secondary | ICD-10-CM

## 2022-03-28 MED ORDER — BUPIVACAINE HCL 0.5 % IJ SOLN
2.0000 mL | INTRAMUSCULAR | Status: AC | PRN
  Administered 2022-03-28: 2 mL via INTRA_ARTICULAR

## 2022-03-28 MED ORDER — METHYLPREDNISOLONE ACETATE 40 MG/ML IJ SUSP
40.0000 mg | INTRAMUSCULAR | Status: AC | PRN
  Administered 2022-03-28: 40 mg via INTRA_ARTICULAR

## 2022-03-28 MED ORDER — LIDOCAINE HCL 1 % IJ SOLN
2.0000 mL | INTRAMUSCULAR | Status: AC | PRN
  Administered 2022-03-28: 2 mL

## 2022-03-28 NOTE — Progress Notes (Signed)
Office Visit Note   Patient: Vanessa Medina           Date of Birth: 05-31-1971           MRN: [id number] Visit Date: 03/28/2022              Requested by: Maryland Pink, MD [address] [place],  [place] [address] PCP: Maryland Pink, MD   Assessment & Plan: Visit Diagnoses:  1. Primary osteoarthritis of right knee     Plan: Impression is degenerative joint disease right knee.  Moderately severe overall.  She has had a prior ACL reconstruction.  She is having a lot of problems with ADLs.  She still dealing with the effects from the stroke which she suffered during a spinal cord stimulator placement.  Based on her options we injected the right knee today.  Handicap placard provided today.  She mentioned that she is scheduled to have revision lumbar fusion surgery with Dr. Patrice Paradise in the near future.  Follow-Up Instructions: No follow-ups on file.   Orders:  Orders Placed This Encounter  Procedures   Large Joint Inj: R knee   XR KNEE 3 VIEW RIGHT   No orders of the defined types were placed in this encounter.     Procedures: Large Joint Inj: R knee on 03/28/2022 3:48 PM Indications: pain Details: 22 G needle  Arthrogram: No  Medications: 40 mg methylPREDNISolone acetate 40 MG/ML; 2 mL lidocaine 1 %; 2 mL bupivacaine 0.5 % Consent was given by the patient. Patient was prepped and draped in the usual sterile fashion.       Clinical Data: No additional findings.   Subjective: Chief Complaint  Patient presents with   Right Knee - Pain    HPI Vanessa Medina returns today for worsening right knee pain.  Experiencing giving way.  Underwent ACL reconstruction 25 years ago.  Feels locking and weakness during the day.  Still feeling residual effects from the stroke and walking with a cane.  Has chronic severe pain in her back as well. Review of Systems  Constitutional: Negative.   HENT: Negative.    Eyes: Negative.   Respiratory: Negative.     Cardiovascular: Negative.   Endocrine: Negative.   Musculoskeletal: Negative.   Neurological: Negative.   Hematological: Negative.   Psychiatric/Behavioral: Negative.    All other systems reviewed and are negative.    Objective: Vital Signs: There were no vitals taken for this visit.  Physical Exam Vitals and nursing note reviewed.  Constitutional:      Appearance: She is well-developed.  HENT:     Head: Atraumatic.     Nose: Nose normal.  Eyes:     Extraocular Movements: Extraocular movements intact.  Cardiovascular:     Pulses: Normal pulses.  Pulmonary:     Effort: Pulmonary effort is normal.  Abdominal:     Palpations: Abdomen is soft.  Musculoskeletal:     Cervical back: Neck supple.  Skin:    General: Skin is warm.     Capillary Refill: Capillary refill takes less than 2 seconds.  Neurological:     Mental Status: She is alert. Mental status is at baseline.  Psychiatric:        Behavior: Behavior normal.        Thought Content: Thought content normal.        Judgment: Judgment normal.     Ortho Exam Examination of right knee shows a fully healed surgical scar.  No  joint effusion.  Tenderness throughout the knee.  Collaterals and cruciates are grossly intact. Specialty Comments:  No specialty comments available.  Imaging: XR KNEE 3 VIEW RIGHT  Result Date: 03/28/2022 Moderately severe tricompartmental degenerative changes.  2 interference screws consistent with prior ACL surgery.  No acute abnormalities.    PMFS History: Patient Active Problem List   Diagnosis Date Noted   Primary osteoarthritis of right knee 03/28/2022   Abnormality of gait 04/29/2021   Abnormal sensation in both ears 04/06/2021   Other adverse food reactions, not elsewhere classified, subsequent encounter 04/06/2021   Multiple drug allergies 04/06/2021   Insomnia due to medical condition 02/25/2021   Myofascial muscle pain 02/25/2021   Nerve pain 10/18/2020   Incomplete  paraplegia (HCC) 10/18/2020   Orthostatic hypotension 10/18/2020   Chronic migraine without aura without status migrainosus, not intractable    Hypokalemia    Adjustment reaction with anxiety and depression    Spinal cord injury, lumbar, without spinal bone injury, sequela ([place]) 08/18/2020   Paraparesis ([place])    Post-operative pain    Hypotension    Slow transit constipation    AKI (acute kidney injury) ([place])    Right leg weakness 08/11/2020   DDD (degenerative disc disease), lumbar 09/02/2019    Class: Chronic   Degenerative disc disease, lumbar 09/02/2019   Chest tightness 09/23/2018   Bradycardia 09/23/2018   Labile blood pressure 03/08/2018   Chronic low back pain 09/03/2017   History of total hip replacement, left 01/26/2016   EDEMA 04/28/2008   LEG PAIN, BILATERAL 12/16/2007   CERVICAL CANCER 07/02/2007   MORBID OBESITY 07/02/2007   DEPRESSION 07/02/2007   COMMON MIGRAINE 07/02/2007   Other allergic rhinitis 07/02/2007   ASTHMA 07/02/2007   GERD 07/02/2007   ELEVATED BLOOD PRESSURE WITHOUT DIAGNOSIS OF HYPERTENSION 07/02/2007   Past Medical History:  Diagnosis Date   Anemia    Cervical cancer ([place])    Family history of adverse reaction to anesthesia     " my mother takes a long time time wake up"   GERD (gastroesophageal reflux disease)    Headache    migraine   History of dysplastic nevus 01/18/2018   right shoulder, recurrent dysplastic nevus   History of shingles    Hypertension    Migraine    Multiple allergies    Obesity    Osteoarthritis    left hip   PONV (postoperative nausea and vomiting)    Sleep apnea    does not wear CPAP   Stroke ([place]) 08/11/2020   Wears glasses     Family History  Problem Relation Age of Onset   Renal Disease Mother    Hypertension Mother    Sudden Cardiac Death Mother    Heart failure Mother    Valvular heart disease Mother    Heart disease Mother    Stroke Brother    Heart attack Brother 48   Breast cancer Cousin         paternal side   Diabetes Other     Past Surgical History:  Procedure Laterality Date   ABDOMINAL HYSTERECTOMY     APPENDECTOMY     BACK SURGERY     BILIOPANCREATIC DIVERSION     with duodenal switch laparoscopic    CHOLECYSTECTOMY     COLONOSCOPY N/A 06/27/2021   Procedure: COLONOSCOPY;  Surgeon: Annamaria Helling, DO;  Location: [facility] ENDOSCOPY;  Service: Gastroenterology;  Laterality: N/A;   DIAGNOSTIC LAPAROSCOPY     LOA  ESOPHAGOGASTRODUODENOSCOPY N/A 06/27/2021   Procedure: ESOPHAGOGASTRODUODENOSCOPY (EGD);  Surgeon: Annamaria Helling, DO;  [facility];  Service: Gastroenterology;  Laterality: N/A;   ESOPHAGOGASTRODUODENOSCOPY (EGD) WITH PROPOFOL N/A 07/25/2017   Procedure: ESOPHAGOGASTRODUODENOSCOPY (EGD) WITH PROPOFOL;  Surgeon: Manya Silvas, MD;  Location: [facility] ENDOSCOPY;  Service: Endoscopy;  Laterality: N/A;   KNEE ARTHROSCOPY     LAPAROSCOPIC GASTRIC SLEEVE RESECTION WITH HIATAL HERNIA REPAIR     TONSILLECTOMY     TOTAL HIP ARTHROPLASTY Left 01/26/2016   Procedure: LEFT TOTAL HIP ARTHROPLASTY ANTERIOR APPROACH;  Surgeon: Leandrew Koyanagi, MD;  Location: [facility];  Service: Orthopedics;  Laterality: Left;   TRANSFORAMINAL LUMBAR INTERBODY FUSION (TLIF) WITH PEDICLE SCREW FIXATION 3 LEVEL  08/2019   Basil Dess, MD L2-L5   Social History   Occupational History   Not on file  Tobacco Use   Smoking status: Never   Smokeless tobacco: Never  Vaping Use   Vaping Use: Never used  Substance and Sexual Activity   Alcohol use: Yes    Comment: occasional wine   Drug use: No   Sexual activity: Not Currently

## 2022-03-29 ENCOUNTER — Ambulatory Visit (INDEPENDENT_AMBULATORY_CARE_PROVIDER_SITE_OTHER): Payer: 59 | Admitting: Neurology

## 2022-03-29 ENCOUNTER — Encounter: Payer: Self-pay | Admitting: Neurology

## 2022-03-29 VITALS — BP 130/75 | HR 63 | Ht 64.0 in | Wt 234.0 lb

## 2022-03-29 DIAGNOSIS — G8929 Other chronic pain: Secondary | ICD-10-CM

## 2022-03-29 DIAGNOSIS — M545 Low back pain, unspecified: Secondary | ICD-10-CM

## 2022-03-29 DIAGNOSIS — Z8673 Personal history of transient ischemic attack (TIA), and cerebral infarction without residual deficits: Secondary | ICD-10-CM

## 2022-03-29 DIAGNOSIS — F450 Somatization disorder: Secondary | ICD-10-CM | POA: Diagnosis not present

## 2022-03-29 NOTE — Progress Notes (Signed)
[facility]  PATIENT: Vanessa Medina DOB: September 05, 1970  REQUESTING CLINICIAN: Maryland Pink, MD HISTORY FROM: Patient  REASON FOR VISIT: Surgery clearance    HISTORICAL  CHIEF COMPLAINT:  Chief Complaint  Patient presents with   New Patient (Initial Visit)    Rm 13. Alone. NP/Paper/[facility] [place]/James Kary Kos MD/hx CVA and seizures needs surgery clearance.    HISTORY OF PRESENT ILLNESS:  This is a 51 year old woman past medical history including chronic back pain status postsurgery, hypertension, migraine headaches, B12 deficiency who is presenting for surgical clearance.  Patient reports a history of chronic back pain, back in 2021 he did have a spinal cord stimulator put in.  She reported during the trial phase she did well, her pain was controlled and doing the removal of the trial stimulator that she did have a seizure.  She does not remember well but reports falling at home and had shaking.  That was the first lifetime seizure and since then has not had any additional seizures.   She reports once they have attempted to put the permanent stimulator she did have a spinal cord injury, and at the same time had a stroke and was paralyzed from the right side.  She was in the ICU and then sent to rehab. She said with the help of rehab she had to relearn how to walk again.  Per chart review, repeat MRI lumbar spine showed a small focus of edema that is concerning for acute spinal cord injury.  At that time she did not have a MRI of her brain. She reported from that injury she had to have right-sided weakness, neuropathy but her nerve conduction test that she done  last year was normal no evidence of any abnormality.  Per patient, she was evaluated by a surgeon who is recommending revision of the L2- L5 fusion surgery because some of her screws were loose.  And due to her previous history of stroke and seizures, she need neurological clearance.   OTHER MEDICAL  CONDITIONS: History of chronic back pain s/p surgery, B 12 deficiency, hypertension, migraines headaches,    REVIEW OF SYSTEMS: Full 14 system review of systems performed and negative with exception of: as noted in the HPI   ALLERGIES: Allergies  Allergen Reactions   Shellfish Allergy Anaphylaxis   Meperidine Hcl Other (See Comments)    BRADYCARDIA   Morphine Other (See Comments)    BRADYCARDIA   Acetaminophen Itching   Bee Pollen Other (See Comments)    Unknown   Ivp Dye [Iodinated Contrast Media] Other (See Comments)    Swelling    Oxycodone Hives and Itching    Blisters on back   Pentazocine Nausea Only and Nausea And Vomiting   Codeine Nausea And Vomiting   Oxycodone-Acetaminophen Itching   Pentazocine Lactate Nausea And Vomiting    REACTION: vomiting with Talwin NX   Propoxyphene Nausea Only and Nausea And Vomiting   Propoxyphene N-Acetaminophen Nausea And Vomiting    HOME MEDICATIONS: Outpatient Medications Prior to Visit  Medication Sig Dispense Refill   aspirin EC 81 MG tablet Take 81 mg by mouth daily. Swallow whole.     azelastine (ASTELIN) 0.1 % nasal spray Place into the nose.     baclofen (LIORESAL) 10 MG tablet Take 1 tablet (10 mg total) by mouth 3 (three) times daily. IF NEEDED- for spasms 270 tablet 3   diclofenac Sodium (VOLTAREN) 1 % GEL APPLY 4 GRAMS TO AFFECTED  AREA(S) TOPICALLY 4 TIMES DAILY  350 g 4   DULoxetine (CYMBALTA) 60 MG capsule Take 60 mg by mouth daily.     EPINEPHrine 0.3 mg/0.3 mL IJ SOAJ injection SMARTSIG:0.3 Milliliter(s) IM Once PRN     fludrocortisone (FLORINEF) 0.1 MG tablet Take 1 tablet (0.1 mg total) by mouth 2 (two) times daily. 60 tablet 5   methocarbamol (ROBAXIN) 500 MG tablet TAKE 1 TABLET BY MOUTH  EVERY 12 HOURS AS NEEDED  FOR MUSCLE SPASMS 30 tablet 0   midodrine (PROAMATINE) 10 MG tablet Take 1 tablet (10 mg total) by mouth 3 (three) times daily. While awake. Can take one additional if dizziness 270 tablet 3   montelukast  (SINGULAIR) 10 MG tablet Take 1 tablet (10 mg total) by mouth at bedtime. For allergies 90 tablet 3   Multiple Vitamin (MULTIVITAMIN WITH MINERALS) TABS tablet Take 1 tablet by mouth 2 (two) times daily.     MYRBETRIQ 25 MG TB24 tablet Take 25 mg by mouth daily.     pramipexole (MIRAPEX) 0.125 MG tablet Take by mouth.     pyridOXINE (VITAMIN B-6) 100 MG tablet Take 1 tablet (100 mg total) by mouth daily. 90 tablet 1   Thiamine HCl (VITAMIN B-1) 250 MG tablet Take 1 tablet (250 mg total) by mouth daily. 90 tablet 1   topiramate (TOPAMAX) 100 MG tablet TAKE 1 TABLET(100 MG) BY MOUTH AT BEDTIME 90 tablet 3   TRULICITY 1.5 GY/1.8HU SOPN Inject 1.5 mg into the skin once a week.     vitamin B-12 (CYANOCOBALAMIN) 1000 MCG tablet Take 1 tablet (1,000 mcg total) by mouth daily. 90 tablet 1   fluticasone (FLONASE) 50 MCG/ACT nasal spray Place 1-2 sprays into both nostrils daily.     No facility-administered medications prior to visit.    PAST MEDICAL HISTORY: Past Medical History:  Diagnosis Date   Anemia    Cervical cancer ([place])    Family history of adverse reaction to anesthesia     " my mother takes a long time time wake up"   GERD (gastroesophageal reflux disease)    Headache    migraine   History of dysplastic nevus 01/18/2018   right shoulder, recurrent dysplastic nevus   History of shingles    Hypertension    Migraine    Multiple allergies    Obesity    Osteoarthritis    left hip   PONV (postoperative nausea and vomiting)    Sleep apnea    does not wear CPAP   Stroke ([place]) 08/11/2020   Wears glasses     PAST SURGICAL HISTORY: Past Surgical History:  Procedure Laterality Date   ABDOMINAL HYSTERECTOMY     APPENDECTOMY     BACK SURGERY     BILIOPANCREATIC DIVERSION     with duodenal switch laparoscopic    CHOLECYSTECTOMY     COLONOSCOPY N/A 06/27/2021   Procedure: COLONOSCOPY;  Surgeon: Annamaria Helling, DO;  Location: [facility] ENDOSCOPY;  Service: Gastroenterology;   Laterality: N/A;   DIAGNOSTIC LAPAROSCOPY     LOA   ESOPHAGOGASTRODUODENOSCOPY N/A 06/27/2021   Procedure: ESOPHAGOGASTRODUODENOSCOPY (EGD);  Surgeon: Annamaria Helling, DO;  Location: [facility] ENDOSCOPY;  Service: Gastroenterology;  Laterality: N/A;   ESOPHAGOGASTRODUODENOSCOPY (EGD) WITH PROPOFOL N/A 07/25/2017   Procedure: ESOPHAGOGASTRODUODENOSCOPY (EGD) WITH PROPOFOL;  Surgeon: Manya Silvas, MD;  Location: [facility] ENDOSCOPY;  Service: Endoscopy;  Laterality: N/A;   KNEE ARTHROSCOPY     LAPAROSCOPIC GASTRIC SLEEVE RESECTION WITH HIATAL HERNIA REPAIR     TONSILLECTOMY  TOTAL HIP ARTHROPLASTY Left 01/26/2016   Procedure: LEFT TOTAL HIP ARTHROPLASTY ANTERIOR APPROACH;  Surgeon: Leandrew Koyanagi, MD;  Location: [place];  Service: Orthopedics;  Laterality: Left;   TRANSFORAMINAL LUMBAR INTERBODY FUSION (TLIF) WITH PEDICLE SCREW FIXATION 3 LEVEL  08/2019   Basil Dess, MD L2-L5    FAMILY HISTORY: Family History  Problem Relation Age of Onset   Renal Disease Mother    Hypertension Mother    Sudden Cardiac Death Mother    Heart failure Mother    Valvular heart disease Mother    Heart disease Mother    Stroke Brother    Heart attack Brother 51   Breast cancer Cousin        paternal side   Diabetes Other     SOCIAL HISTORY: Social History   Socioeconomic History   Marital status: Divorced    Spouse name: Not on file   Number of children: 1   Years of education: Not on file   Highest education level: Not on file  Occupational History   Not on file  Tobacco Use   Smoking status: Never   Smokeless tobacco: Never  Vaping Use   Vaping Use: Never used  Substance and Sexual Activity   Alcohol use: Yes    Comment: occasional wine   Drug use: No   Sexual activity: Not Currently  Other Topics Concern   Not on file  Social History Narrative   Right handed    Uses cane to walk    Social Determinants of Health   Financial Resource Strain: Not on file  Food Insecurity: Not on  file  Transportation Needs: Not on file  Physical Activity: Not on file  Stress: Not on file  Social Connections: Not on file  Intimate Partner Violence: Not on file    PHYSICAL EXAM  GENERAL EXAM/CONSTITUTIONAL: Vitals:  Vitals:   03/29/22 0927  BP: 130/75  Pulse: 63  Weight: 234 lb (106.1 kg)  Height: '5\' 4"'$  (1.626 m)   Body mass index is 40.17 kg/m. Wt Readings from Last 3 Encounters:  03/29/22 234 lb (106.1 kg)  03/20/22 237 lb (107.5 kg)  02/22/22 238 lb (108 kg)   Patient is in no distress; well developed, nourished and groomed; neck is supple  EYES: Pupils round and reactive to light, Visual fields full to confrontation, Extraocular movements intacts,   MUSCULOSKELETAL: Gait, strength, tone, movements noted in Neurologic exam below  NEUROLOGIC: MENTAL STATUS:      No data to display         awake, alert, oriented to person, place and time recent and remote memory intact normal attention and concentration language fluent, comprehension intact, naming intact fund of knowledge appropriate  CRANIAL NERVE:  2nd, 3rd, 4th, 6th - pupils equal and reactive to light, visual fields full to confrontation, extraocular muscles intact, no nystagmus 5th - facial sensation symmetric 7th - facial strength symmetric 8th - hearing intact 9th - palate elevates symmetrically, uvula midline 11th - shoulder shrug symmetric 12th - tongue protrusion midline  MOTOR:  normal bulk and tone, Decrease strength in the RLE 4/5 but patient has poor effort   SENSORY:  normal and symmetric to light touch but reports decrease sensation to vibration in the bilateral feet  COORDINATION:  finger-nose-finger, fine finger movements normal  REFLEXES:  deep tendon reflexes present and symmetric  GAIT/STATION:  Ambulates with a cane    DIAGNOSTIC DATA (LABS, IMAGING, TESTING) - I reviewed patient records, labs, notes, testing  and imaging myself where available.  Lab Results   Component Value Date   WBC 4.4 08/30/2020   HGB 9.3 (L) 08/30/2020   HCT 28.0 (L) 08/30/2020   MCV 100.4 (H) 08/30/2020   PLT 202 08/30/2020      Component Value Date/Time   NA 139 08/30/2020 0459   NA 142 06/24/2014 0522   K 3.6 08/30/2020 0459   K 4.0 06/24/2014 0522   CL 109 08/30/2020 0459   CL 107 06/24/2014 0522   CO2 20 (L) 08/30/2020 0459   CO2 28 06/24/2014 0522   GLUCOSE 92 08/30/2020 0459   GLUCOSE 110 (H) 06/24/2014 0522   BUN 13 08/30/2020 0459   BUN 4 (L) 06/24/2014 0522   CREATININE 0.95 08/30/2020 0459   CREATININE 0.84 06/24/2014 0522   CALCIUM 8.8 (L) 08/30/2020 0459   CALCIUM 7.9 (L) 06/24/2014 0522   PROT 5.8 (L) 08/19/2020 0440   PROT 7.8 06/24/2013 1056   ALBUMIN 3.0 (L) 08/19/2020 0440   ALBUMIN 3.7 06/24/2013 1056   AST 102 (H) 08/19/2020 0440   AST 38 (H) 06/24/2013 1056   ALT 262 (H) 08/19/2020 0440   ALT 74 06/24/2013 1056   ALKPHOS 76 08/19/2020 0440   ALKPHOS 75 06/24/2013 1056   BILITOT 1.0 08/19/2020 0440   BILITOT 0.5 06/24/2013 1056   GFRNONAA >60 08/30/2020 0459   GFRNONAA >60 06/24/2014 0522   GFRNONAA >60 06/24/2013 1056   GFRAA >60 09/06/2019 1258   GFRAA >60 06/24/2014 0522   GFRAA >60 06/24/2013 1056   No results found for: "CHOL", "HDL", "LDLCALC", "LDLDIRECT", "TRIG", "CHOLHDL" Lab Results  Component Value Date   HGBA1C 5.1 06/24/2013   No results found for: "VITAMINB12" Lab Results  Component Value Date   TSH 1.879 08/26/2020    NCV and EMG 04/12/21 Right sural and superficial peroneal sensory responses are within normal limits. Right peroneal and tibial motor responses are within normal limits. Right tibial H reflex study is within normal limits. There is no evidence of active or chronic motor axonal loss changes affecting any of the tested muscles.  There is a global pattern of incomplete motor unit activation as seen by variable recruitment, these findings are most likely due to pain or poor effort.  MRI Brain  11/30/2009 No focal intracranial abnormality evident. No abnormal  FLAIR sequence signal to suggest demyelination or sequela of migraines. No lacunar infarct, intracranial mass, mass effect or hemorrhage is evident  CT Head 01/28/2017 1. Normal brain.  Lumbar spine MRI 07/13/2021 1. Lumbar spine degeneration with L2-L5 fusion. 2. No interval adjacent segment degeneration. No focal or compressive stenosis.  MRI T and L Spine 08/11/2020 1. Subtle focus of increased T2/STIR signal intensity with post contrast enhancement within the right aspect of the distal cord at the level of T12-L1, new from previous exam. Given the history of recent spinal stimulator placement, finding suspicious for a small focus of edema/acute spinal cord injury. 2. No other acute abnormality within the thoracic or lumbar spine. No evidence for CSF leak. 3. Prior PLIF at L2 through L5 without residual or recurrent spinal stenosis. Residual right foraminal protrusion at L3-4, closely approximating and potentially irritating the exiting right L3 nerve root, stable  Lumbar spine MRI 05/15/2020 1. Sequelae of prior PLIF at L2 through L5 with left-sided facetectomy and foraminotomy. No significant residual canal or foraminal stenosis or adverse features. 2. Mild to moderate facet hypertrophy at L1-2 without significant stenosis.    ASSESSMENT AND PLAN  50  y.o. year old female with multiple chronic conditions including back pain status post surgery, migraine headaches, hypertension, neuropathy and gait abnormality who is presenting for surgical clearance.  Patient reports after her spinal cord stimulator placement, she did have a seizure and stroke with spinal cord injury leaving her paralyzed in the right lower extremity.  In terms of the seizures, it only occurred once and never had a second reoccurrence.  She reports that it happened 2 weeks after the temporary spinal cord stimulator was put in, does not remember the details. She  is not on any antiseizure medication and denies any reoccurrence  In terms of the stroke, it is unclear if patient has actual cerebrovascular stroke or she had a spinal cord injury during the procedure.  She had MRI that did not show any previous stroke and her MRI T-spine following surgery showed edema of the spinal cord.  Repeat MRI lumbar spine showed no chronic changes of the spinal cord.  She did have a EMG nerve conduction study done last year that was normal, no evidence of peripheral neuropathy.   On exam today she is noted at the time to have volitional  left lower lip drooping, volitional left eye droop and on exam she has very poor effort.  I did tell patient that I am concerned about somatization disorder.  I explained to the patient what Somatization disorder is and told her to contact her surgeon to provide surgical clearance form.  If they want [name] to send just a statement, I will be happy to write it.   Continue to follow-up with your PCP, continue current medications, establish care with a psychiatrist or therapist to help with all the current stressors and return as needed.    1. History of stroke   2. Chronic low back pain, unspecified back pain laterality, unspecified whether sciatica present   3. Somatization disorder      Patient Instructions  Continue current medications Repeat MRI brain to clarify her history of stroke Advised patient to ask surgeon to provide surgical clearance form to be completed Continue to follow with PCP and return as needed.  Orders Placed This Encounter  Procedures   MR BRAIN WO CONTRAST    No orders of the defined types were placed in this encounter.   Return if symptoms worsen or fail to improve.  I have spent a total of 50 minutes dedicated to this patient today, preparing to see patient, performing a medically appropriate examination and evaluation, ordering tests and/or medications and procedures, and counseling and educating the  patient/family/caregiver; independently interpreting result and communicating results to the family/patient/caregiver; and documenting clinical information in the electronic medical record.   Alric Ran, MD 03/29/2022, 12:56 PM  [facility] [address] [place], [place] [address] [phone]

## 2022-03-29 NOTE — Patient Instructions (Addendum)
Continue current medications Repeat MRI brain to clarify her history of stroke Advised patient to ask surgeon to provide surgical clearance form to be completed Continue to follow with PCP and return as needed.

## 2022-03-30 ENCOUNTER — Telehealth: Payer: Self-pay | Admitting: Neurology

## 2022-03-30 NOTE — Telephone Encounter (Signed)
30 mins MRI brain wo contrast Dr. April Manson [facility] Josem Kaufmann: [id number] exp. 03/30/22-05/14/22 scheduled at [facility] 04/04/22 at 2pm

## 2022-04-01 ENCOUNTER — Other Ambulatory Visit: Payer: Self-pay | Admitting: Specialist

## 2022-04-01 ENCOUNTER — Other Ambulatory Visit: Payer: Self-pay | Admitting: Physical Medicine and Rehabilitation

## 2022-04-04 ENCOUNTER — Ambulatory Visit (INDEPENDENT_AMBULATORY_CARE_PROVIDER_SITE_OTHER): Payer: 59

## 2022-04-04 DIAGNOSIS — Z8673 Personal history of transient ischemic attack (TIA), and cerebral infarction without residual deficits: Secondary | ICD-10-CM | POA: Diagnosis not present

## 2022-04-06 ENCOUNTER — Telehealth: Payer: Self-pay | Admitting: *Deleted

## 2022-04-06 NOTE — Telephone Encounter (Signed)
Received surgical clearance form from [facility] (ph: [phone], fax: [phone]). Patient will be having revision L2-5 laminectomy and fusion. Her aspirin '81mg'$  will be held seven day prior to surgery. Dr. April Manson cleared her from a neurological standpoint. Signed clearance form, clinic notes and MRI results faxed back to that office. Confirmation received.

## 2022-04-27 ENCOUNTER — Telehealth: Payer: Self-pay | Admitting: Cardiovascular Disease

## 2022-04-27 NOTE — Telephone Encounter (Signed)
Patient seeing a new cardiologist Deleting recall

## 2022-05-02 ENCOUNTER — Other Ambulatory Visit: Payer: Self-pay | Admitting: Radiology

## 2022-05-02 ENCOUNTER — Other Ambulatory Visit: Payer: Self-pay

## 2022-05-02 DIAGNOSIS — G903 Multi-system degeneration of the autonomic nervous system: Secondary | ICD-10-CM

## 2022-05-02 MED ORDER — TOPIRAMATE 100 MG PO TABS: ORAL_TABLET | ORAL | 3 refills | Status: DC

## 2022-05-02 MED ORDER — MIDODRINE HCL 10 MG PO TABS: 10.0000 mg | ORAL_TABLET | Freq: Three times a day (TID) | ORAL | 3 refills | Status: DC

## 2022-05-02 MED ORDER — BACLOFEN 10 MG PO TABS
10.0000 mg | ORAL_TABLET | Freq: Three times a day (TID) | ORAL | 3 refills | Status: DC
Start: 2022-05-02 — End: 2022-11-13

## 2022-05-02 MED ORDER — GABAPENTIN 300 MG PO CAPS: 300.0000 mg | ORAL_CAPSULE | Freq: Three times a day (TID) | ORAL | 3 refills | Status: DC

## 2022-05-02 MED ORDER — FLUDROCORTISONE ACETATE 0.1 MG PO TABS: 0.2000 mg | ORAL_TABLET | Freq: Every day | ORAL | 5 refills | Status: DC

## 2022-05-02 MED ORDER — DULOXETINE HCL 60 MG PO CPEP: ORAL_CAPSULE | ORAL | 3 refills | Status: DC

## 2022-05-02 MED ORDER — MONTELUKAST SODIUM 10 MG PO TABS: ORAL_TABLET | ORAL | 3 refills | Status: DC

## 2022-05-02 MED ORDER — METHOCARBAMOL 500 MG PO TABS: ORAL_TABLET | ORAL | 3 refills | Status: DC

## 2022-05-19 DIAGNOSIS — M960 Pseudarthrosis after fusion or arthrodesis: Secondary | ICD-10-CM | POA: Insufficient documentation

## 2022-06-02 DIAGNOSIS — M961 Postlaminectomy syndrome, not elsewhere classified: Secondary | ICD-10-CM | POA: Insufficient documentation

## 2022-06-07 ENCOUNTER — Encounter: Payer: Self-pay | Admitting: Dermatology

## 2022-06-09 ENCOUNTER — Telehealth: Payer: Self-pay

## 2022-06-09 NOTE — Telephone Encounter (Signed)
Vanessa Medina called:   She will have back surgery by Dr. Rennis Harding with more rods & screws. The surgery will be sometime next week.    Call back phone [phone].

## 2022-06-09 NOTE — Telephone Encounter (Signed)
Having surgery again in the next week. By Dr Rennis Harding Revision L2-5 and redo screws- screws are loose.   Also doing fusion of lumbar spine.   Insurance doesn't cover me right now- but will come back in January due to changing insurance again.   RLE has gotten really weak so hopefully will get better.

## 2022-06-12 ENCOUNTER — Ambulatory Visit: Payer: 59 | Admitting: Physical Medicine and Rehabilitation

## 2022-06-21 ENCOUNTER — Ambulatory Visit: Payer: 59 | Admitting: Physical Medicine and Rehabilitation

## 2022-08-09 ENCOUNTER — Telehealth: Payer: Self-pay | Admitting: Orthopedic Surgery

## 2022-08-09 NOTE — Telephone Encounter (Signed)
Pt called in requesting refill on medication (diclofenac Sodium (VOLTAREN) 1 % GEL) and (gabapentin (NEURONTIN) 300 MG capsule)... Pt stated that she have a new pharmacy... Pt is requesting script to be sent to [facility] [place] [address], Fax Number is [phone]... Pt stated the national id number is [id number]

## 2022-08-09 NOTE — Telephone Encounter (Signed)
I called and advised patient that Dr. Louanne Skye retired and that we can no longer refill his rx's with out her being seen by someone here or she could talk to her PCP, she states that she is having surgery at the end of the month and she will talk to them about the medication.

## 2022-08-29 HISTORY — PX: LUMBAR FUSION: SHX111

## 2022-09-20 ENCOUNTER — Encounter: Payer: Self-pay | Admitting: Physical Medicine and Rehabilitation

## 2022-10-19 ENCOUNTER — Other Ambulatory Visit: Payer: Self-pay

## 2022-10-19 DIAGNOSIS — Z1231 Encounter for screening mammogram for malignant neoplasm of breast: Secondary | ICD-10-CM

## 2022-11-13 ENCOUNTER — Encounter: Payer: Self-pay | Admitting: Physical Medicine and Rehabilitation

## 2022-11-13 ENCOUNTER — Encounter
Payer: Medicaid Other | Attending: Physical Medicine and Rehabilitation | Admitting: Physical Medicine and Rehabilitation

## 2022-11-13 VITALS — BP 130/78 | HR 83 | Temp 97.6°F | Ht 64.0 in | Wt 254.0 lb

## 2022-11-13 DIAGNOSIS — M7918 Myalgia, other site: Secondary | ICD-10-CM | POA: Diagnosis not present

## 2022-11-13 DIAGNOSIS — G8222 Paraplegia, incomplete: Secondary | ICD-10-CM | POA: Diagnosis not present

## 2022-11-13 DIAGNOSIS — G903 Multi-system degeneration of the autonomic nervous system: Secondary | ICD-10-CM | POA: Diagnosis not present

## 2022-11-13 MED ORDER — MIDODRINE HCL 10 MG PO TABS: 10.0000 mg | ORAL_TABLET | Freq: Three times a day (TID) | ORAL | 3 refills | Status: DC

## 2022-11-13 MED ORDER — FLUDROCORTISONE ACETATE 0.1 MG PO TABS: 0.2000 mg | ORAL_TABLET | Freq: Every day | ORAL | 3 refills | Status: DC

## 2022-11-13 MED ORDER — LIDOCAINE HCL 1 % IJ SOLN
9.0000 mL | Freq: Once | INTRAMUSCULAR | Status: AC
  Administered 2022-11-13: 9 mL via INTRADERMAL

## 2022-11-13 MED ORDER — GABAPENTIN 300 MG PO CAPS: 300.0000 mg | ORAL_CAPSULE | Freq: Every day | ORAL | 3 refills | Status: DC

## 2022-11-13 MED ORDER — DULOXETINE HCL 60 MG PO CPEP: ORAL_CAPSULE | ORAL | 3 refills | Status: DC

## 2022-11-13 MED ORDER — BACLOFEN 10 MG PO TABS: 10.0000 mg | ORAL_TABLET | Freq: Three times a day (TID) | ORAL | 3 refills | Status: DC

## 2022-11-13 NOTE — Progress Notes (Signed)
Pt is a 52 yr old female with paraparesis/incomplete paraplegia due to attempted spinal cord stimulator placement- With chronic hx of orthostasis, BMI of 41 - back dwon from 75-  s/p gastric sleeve, Acute on chronic renal issues- CKD stage II,  here for f/u on SCI and neurogenic orthostatic hypotension. Vestibular issues due to trigger points and central issues.  Also had surgery on back in December 2023- L2-L3 laminectomy- at [facility]- Dr Rennis Harding- [facility] in [place], is Ortho spine per pt.   Here for f/u on chronic pain and myofascial pain dysfunction and incomplete paraplegia- ASIA D  Insurance didn't cover me for 6 months- but finally found got Medicaid, so can now see me again.  Had problems getting insurance to cover all physicians. Finally got qualified for Medicaid.    Working on getting disability- from last surgery, gathering paperwork.    Uses rollator for long term walking- for a walk- but uses cane and asked her to reduce use of rollator since had surgery.    Not on any opiates l Since shortly after surgery.  To start in PT in April- want her to heal, before starts PT- to See Dr Patrice Paradise in April first.   Fighting "sinus infection" due to allergies.   Walks 2-3x/week- 10-15 minutes, depends on the weather.    Does HEP in chair at home- leg exercises- 10- 15 minutes- does 3x/day.  While watching TV.   BP has been up lately- is 130/78-  Still taking the midodrine -3x/day and Florinef 02. Mg daily  Dr Louanne Skye- put her on Gabapentin - written for 300 mg TID but got changed to 300 mg QHS-  per pt-  Dr Louanne Skye retired.  Didn't remember any side effects from TID.   Nerve pain- a whole lot better than was- "he moved a lot of nerve around in back that were pinched"- and feeling much better. Doesn't have discomfort that had before- where nerves hurt in legs- that's gone- since surgery.  Still taking Duloxetine 60 mg QHS and Baclofen 10 mg TID.    Doesn't have evening  twitching in legs anymore since surgery- only if overdoes it now- not nightly- and no tears anymore    Stays away from sick people and really "everyone".  Dad has cancer right now- spreading from bladder /prostate now to lungs.   Exam: Awake, alert, appropriate, has lost weight- Bmi down to 43.6- NAD Gait- dragging R leg somewhat with gait step to gait- walking with quad cane,   Plan:  Patient here for trigger point injections for  myofascial pain  Consent done and on chart.  Cleaned areas with alcohol and injected using a 27 gauge 1.5 inch needle  Injected 6cc- wasted 3cc Using 1% Lidocaine with no EPI  Upper traps B/L  x2 Levators B/L  Posterior scalenes B/L  Middle scalenes- B/L  Splenius Capitus Pectoralis Major B/L  Rhomboids B/L  Infraspinatus Teres Major/minor Thoracic paraspinals- B/L  Lumbar paraspinals- B/L  Other injections-    Patient's level of pain prior was- 4/10-  Current level of pain after injections is- usually takes longer to improve after injections  There was no bleeding or complications.  Patient was advised to drink a lot of water on day after injections to flush system Will have increased soreness for 12-48 hours after injections.  Can use Lidocaine patches the day AFTER injections Can use theracane on day of injections in places didn't inject Can use heating pad 4-6 hours AFTER injections  2. Con't Baclofen 10 mg TID- for spasticity- doing well on current dose, so doesn't have Sx's as long as takes baclofen at current dose.   3. Con't Gabapentin 300 mg QHS not TID, and Duloxetine 60 mg QHS  4. Con't Midodrine and Flornef- needs refills- sent to [facility] by [facility] per pt request- all of meds.    5. Working on losing weight- has lost -was 280 lbs, and now 254 today- had put a few pounds back on. Continue to encourage.   6. F/U in 2 months- every 2 months- will need wait list but then schedule q2 months-   I spent a total of 36   minutes  on total care today- >50% coordination of care- due to 6 minutes on injections and 30 minutes discussing where she's at since has been 6 months since last seen

## 2022-11-13 NOTE — Patient Instructions (Signed)
Plan:  Patient here for trigger point injections for  myofascial pain  Consent done and on chart.  Cleaned areas with alcohol and injected using a 27 gauge 1.5 inch needle  Injected 6cc- wasted 3cc Using 1% Lidocaine with no EPI  Upper traps B/L  x2 Levators B/L  Posterior scalenes B/L  Middle scalenes- B/L  Splenius Capitus Pectoralis Major B/L  Rhomboids B/L  Infraspinatus Teres Major/minor Thoracic paraspinals- B/L  Lumbar paraspinals- B/L  Other injections-    Patient's level of pain prior was- 4/10-  Current level of pain after injections is- usually takes longer to improve after injections  There was no bleeding or complications.  Patient was advised to drink a lot of water on day after injections to flush system Will have increased soreness for 12-48 hours after injections.  Can use Lidocaine patches the day AFTER injections Can use theracane on day of injections in places didn't inject Can use heating pad 4-6 hours AFTER injections  2. Con't Baclofen 10 mg TID- for spasticity- doing well on current dose, so doesn't have Sx's as long as takes baclofen at current dose.   3. Con't Gabapentin 300 mg QHS not TID, and Duloxetine 60 mg QHS  4. Con't Midodrine and Flornef- needs refills- sent to [facility] by [facility] per pt request- all of meds.    5. Working on losing weight- has lost -was 280 lbs, and now 254 today- had put a few pounds back on. Continue to encourage.   6. F/U in 2 months- every 2 months- will need wait list but then schedule q2 months-

## 2022-11-15 ENCOUNTER — Telehealth: Payer: Self-pay | Admitting: *Deleted

## 2022-11-15 NOTE — Telephone Encounter (Signed)
PA initiated for Cymbalta 60 mg via covermymed:Shilee Comacho (Key: [id number]) [id number]

## 2022-11-20 ENCOUNTER — Encounter: Payer: Self-pay | Admitting: Physical Medicine and Rehabilitation

## 2022-11-20 NOTE — Telephone Encounter (Signed)
This request has received an Unfavorable outcome.  An eAppeal may be available. View the bottom of the request to see if an eAppeal is available along with any additional information provided by [facility] [place]

## 2022-11-23 ENCOUNTER — Ambulatory Visit
Admission: RE | Admit: 2022-11-23 | Discharge: 2022-11-23 | Disposition: A | Discharge status: Home / Self Care | Payer: Medicaid Other | Source: Ambulatory Visit | Attending: Obstetrics and Gynecology | Admitting: Obstetrics and Gynecology

## 2022-11-23 DIAGNOSIS — Z1231 Encounter for screening mammogram for malignant neoplasm of breast: Secondary | ICD-10-CM | POA: Diagnosis present

## 2022-11-28 ENCOUNTER — Other Ambulatory Visit: Payer: Self-pay | Admitting: Family Medicine

## 2022-11-28 ENCOUNTER — Other Ambulatory Visit: Payer: Self-pay | Admitting: Obstetrics and Gynecology

## 2022-11-28 DIAGNOSIS — N63 Unspecified lump in unspecified breast: Secondary | ICD-10-CM

## 2022-11-28 DIAGNOSIS — R928 Other abnormal and inconclusive findings on diagnostic imaging of breast: Secondary | ICD-10-CM

## 2022-11-29 ENCOUNTER — Ambulatory Visit
Admission: RE | Admit: 2022-11-29 | Discharge: 2022-11-29 | Disposition: A | Discharge status: Home / Self Care | Payer: Medicaid Other | Source: Ambulatory Visit | Attending: Obstetrics and Gynecology | Admitting: Obstetrics and Gynecology

## 2022-11-29 DIAGNOSIS — R928 Other abnormal and inconclusive findings on diagnostic imaging of breast: Secondary | ICD-10-CM | POA: Diagnosis not present

## 2022-11-29 DIAGNOSIS — N63 Unspecified lump in unspecified breast: Secondary | ICD-10-CM

## 2022-12-01 ENCOUNTER — Other Ambulatory Visit: Payer: Self-pay | Admitting: Obstetrics and Gynecology

## 2022-12-01 DIAGNOSIS — R928 Other abnormal and inconclusive findings on diagnostic imaging of breast: Secondary | ICD-10-CM

## 2022-12-01 DIAGNOSIS — N63 Unspecified lump in unspecified breast: Secondary | ICD-10-CM

## 2022-12-06 ENCOUNTER — Ambulatory Visit
Admission: RE | Admit: 2022-12-06 | Discharge: 2022-12-06 | Disposition: A | Discharge status: Home / Self Care | Payer: BLUE CROSS/BLUE SHIELD | Source: Ambulatory Visit | Attending: Obstetrics and Gynecology | Admitting: Obstetrics and Gynecology

## 2022-12-06 DIAGNOSIS — R928 Other abnormal and inconclusive findings on diagnostic imaging of breast: Secondary | ICD-10-CM

## 2022-12-06 DIAGNOSIS — N63 Unspecified lump in unspecified breast: Secondary | ICD-10-CM | POA: Diagnosis present

## 2022-12-06 HISTORY — PX: BREAST BIOPSY: SHX20

## 2022-12-06 MED ORDER — LIDOCAINE HCL (PF) 1 % IJ SOLN
5.0000 mL | Freq: Once | INTRAMUSCULAR | Status: AC
  Administered 2022-12-06: 5 mL via INTRADERMAL

## 2022-12-06 MED ORDER — LIDOCAINE-EPINEPHRINE 1 %-1:100000 IJ SOLN
10.0000 mL | Freq: Once | INTRAMUSCULAR | Status: AC
  Administered 2022-12-06: 10 mL via INTRADERMAL

## 2022-12-07 ENCOUNTER — Encounter: Payer: Self-pay | Admitting: *Deleted

## 2022-12-07 DIAGNOSIS — C50919 Malignant neoplasm of unspecified site of unspecified female breast: Secondary | ICD-10-CM

## 2022-12-07 NOTE — Progress Notes (Signed)
Received referral for newly diagnosed breast cancer from [facility].  Navigation initiated.  She will see Dr. Darrall Dears tomorrow and referral has been entered for [facility], she is scheduled to see Dr. Dahlia Byes on Monday.

## 2022-12-08 ENCOUNTER — Encounter: Payer: Self-pay | Admitting: *Deleted

## 2022-12-08 ENCOUNTER — Inpatient Hospital Stay: Payer: Medicaid Other | Attending: Internal Medicine | Admitting: Internal Medicine

## 2022-12-08 ENCOUNTER — Inpatient Hospital Stay: Payer: Medicaid Other | Admitting: Licensed Clinical Social Worker

## 2022-12-08 ENCOUNTER — Inpatient Hospital Stay: Payer: Medicaid Other

## 2022-12-08 VITALS — HR 72 | Temp 98.0°F | Resp 18 | Wt 232.5 lb

## 2022-12-08 DIAGNOSIS — C50812 Malignant neoplasm of overlapping sites of left female breast: Secondary | ICD-10-CM | POA: Diagnosis not present

## 2022-12-08 DIAGNOSIS — Z8673 Personal history of transient ischemic attack (TIA), and cerebral infarction without residual deficits: Secondary | ICD-10-CM | POA: Insufficient documentation

## 2022-12-08 DIAGNOSIS — Z170 Estrogen receptor positive status [ER+]: Secondary | ICD-10-CM

## 2022-12-08 DIAGNOSIS — Z7982 Long term (current) use of aspirin: Secondary | ICD-10-CM | POA: Diagnosis not present

## 2022-12-08 DIAGNOSIS — K219 Gastro-esophageal reflux disease without esophagitis: Secondary | ICD-10-CM | POA: Diagnosis not present

## 2022-12-08 DIAGNOSIS — I10 Essential (primary) hypertension: Secondary | ICD-10-CM | POA: Insufficient documentation

## 2022-12-08 DIAGNOSIS — E038 Other specified hypothyroidism: Secondary | ICD-10-CM

## 2022-12-08 DIAGNOSIS — R5383 Other fatigue: Secondary | ICD-10-CM

## 2022-12-08 DIAGNOSIS — Z79899 Other long term (current) drug therapy: Secondary | ICD-10-CM | POA: Insufficient documentation

## 2022-12-08 DIAGNOSIS — Z9884 Bariatric surgery status: Secondary | ICD-10-CM | POA: Insufficient documentation

## 2022-12-08 DIAGNOSIS — D7589 Other specified diseases of blood and blood-forming organs: Secondary | ICD-10-CM | POA: Diagnosis not present

## 2022-12-08 DIAGNOSIS — Z803 Family history of malignant neoplasm of breast: Secondary | ICD-10-CM

## 2022-12-08 DIAGNOSIS — Z8541 Personal history of malignant neoplasm of cervix uteri: Secondary | ICD-10-CM | POA: Insufficient documentation

## 2022-12-08 DIAGNOSIS — Z90710 Acquired absence of both cervix and uterus: Secondary | ICD-10-CM | POA: Diagnosis not present

## 2022-12-08 DIAGNOSIS — C50919 Malignant neoplasm of unspecified site of unspecified female breast: Secondary | ICD-10-CM | POA: Insufficient documentation

## 2022-12-08 DIAGNOSIS — R634 Abnormal weight loss: Secondary | ICD-10-CM | POA: Insufficient documentation

## 2022-12-08 LAB — IRON AND TIBC
Iron: 106 ug/dL (ref 28–170)
Saturation Ratios: 21 % (ref 10.4–31.8)
TIBC: 496 ug/dL — ABNORMAL HIGH (ref 250–450)
UIBC: 390 ug/dL

## 2022-12-08 LAB — CBC WITH DIFFERENTIAL/PLATELET
Abs Immature Granulocytes: 0.01 10*3/uL (ref 0.00–0.07)
Basophils Absolute: 0 10*3/uL (ref 0.0–0.1)
Basophils Relative: 1 %
Eosinophils Absolute: 0.2 10*3/uL (ref 0.0–0.5)
Eosinophils Relative: 5 %
HCT: 37.6 % (ref 36.0–46.0)
Hemoglobin: 11.9 g/dL — ABNORMAL LOW (ref 12.0–15.0)
Immature Granulocytes: 0 %
Lymphocytes Relative: 26 %
Lymphs Abs: 1.2 10*3/uL (ref 0.7–4.0)
MCH: 31.6 pg (ref 26.0–34.0)
MCHC: 31.6 g/dL (ref 30.0–36.0)
MCV: 99.7 fL (ref 80.0–100.0)
Monocytes Absolute: 0.3 10*3/uL (ref 0.1–1.0)
Monocytes Relative: 6 %
Neutro Abs: 2.9 10*3/uL (ref 1.7–7.7)
Neutrophils Relative %: 62 %
Platelets: 295 10*3/uL (ref 150–400)
RBC: 3.77 MIL/uL — ABNORMAL LOW (ref 3.87–5.11)
RDW: 13 % (ref 11.5–15.5)
WBC: 4.7 10*3/uL (ref 4.0–10.5)
nRBC: 0 % (ref 0.0–0.2)

## 2022-12-08 LAB — COMPREHENSIVE METABOLIC PANEL
ALT: 15 U/L (ref 0–44)
AST: 19 U/L (ref 15–41)
Albumin: 4.1 g/dL (ref 3.5–5.0)
Alkaline Phosphatase: 123 U/L (ref 38–126)
Anion gap: 4 — ABNORMAL LOW (ref 5–15)
BUN: 26 mg/dL — ABNORMAL HIGH (ref 6–20)
CO2: 23 mmol/L (ref 22–32)
Calcium: 8.7 mg/dL — ABNORMAL LOW (ref 8.9–10.3)
Chloride: 110 mmol/L (ref 98–111)
Creatinine, Ser: 1.09 mg/dL — ABNORMAL HIGH (ref 0.44–1.00)
GFR, Estimated: >60 mL/min (ref >60)
Glucose, Bld: 86 mg/dL (ref 70–99)
Potassium: 3.8 mmol/L (ref 3.5–5.1)
Sodium: 137 mmol/L (ref 135–145)
Total Bilirubin: 0.4 mg/dL (ref 0.3–1.2)
Total Protein: 7.8 g/dL (ref 6.5–8.1)

## 2022-12-08 LAB — FOLATE: Folate: 18.7 ng/mL (ref >5.9)

## 2022-12-08 LAB — FERRITIN: Ferritin: 14 ng/mL (ref 11–307)

## 2022-12-08 LAB — TSH: TSH: 1.605 u[IU]/mL (ref 0.350–4.500)

## 2022-12-08 LAB — VITAMIN B12: Vitamin B-12: 431 pg/mL (ref 180–914)

## 2022-12-08 LAB — SURGICAL PATHOLOGY

## 2022-12-08 NOTE — Progress Notes (Signed)
Accompanied patient to initial medical oncology appointment.   Reviewed Breast Cancer treatment handbook.   Care plan summary given to patient.   Reviewed outreach programs and cancer center services.   

## 2022-12-08 NOTE — Progress Notes (Signed)
[facility] CONSULT NOTE  Patient Care Team: [name], MD as PCP - General (Family Medicine) [name], [name], MD as PCP - Cardiology (Cardiology) [name], MD as Consulting Physician (Cardiology) [name], DO as Consulting Physician (Neurology) [name], RN as Oncology Nurse Navigator  REFERRING PROVIDER: Dr. Burnett Sheng  REASON FOR REFFERAL: left breast cancer  CANCER STAGING   Cancer Staging  Breast cancer Staging form: Breast, AJCC 8th Edition - Clinical stage from 12/06/2022: Stage IA (cT1b, cN0, cM0, G2, ER+, PR+, HER2-) - Signed by [name], MD on 12/08/2022 Stage prefix: Initial diagnosis Histologic grading system: 3 grade system   ASSESSMENT & PLAN:  Vanessa Medina 52 y.o. female with pmh of Right-sided hemiparesis as complication from permanent spinal stimulator placement, orthostasis on midodrine and fludrocortisone, hypertension, migraine, GERD, cholecystectomy, lumbar fusion in December 2023, gastric sleeve was referred to medical oncology for newly diagnosed left breast hormone positive cancer.  # Left breast IDC, multifocal, ER/PR positive, HER2 negative -The mass was detected on screening mammogram.  Imaging showing 3 masses. Mass 1 at 1:00 3 cm from the nipple measuring 7 x by 8 x 8 mm.  Mass 2 1:30 o'clock 1 cm from nipple measuring 9 x 7 x 9 mm.  Mass 3 at 2:00 1 cm from nipple measuring 6 x 8 x 4 mm.  No suspicious left axillary lymph node 1.  Mass 1 and mass 3 was biopsied consistent with invasive ductal cancer.  Both are strongly expressing ER and PR receptors.  HER2 negative.  -I discussed the imaging findings, staging, prognosis and treatment with the patient.  Imaging showing multifocal disease with 3 masses in the left breast in the same quadrant and they all are less than a centimeter and strongly expressing hormone receptors.  No abnormal axillary lymphadenopathy was noted.  I would favor proceeding with upfront  surgery.  She is scheduled to see Dr. Everlene Farrier on Monday to discuss about lumpectomy versus mastectomy.  Will send the tumor for Oncotype testing to help US guide role of adjuvant chemotherapy.  Depending on the type of surgery and surgical pathology, we will make referral to radiation oncology.  She will also benefit from endocrine therapy such as aromatase inhibitor for 5 years.  Side effects were briefly discussed such as myalgias, arthralgias, hot flashes, mood changes, osteoporosis.  Will obtain a bone density scan as baseline.  Advised taking calcium vitamin D supplements.  She had abdominal hysterectomy in the past due to endometriosis so does not have menstrual period's but considering her age she is likely postmenopausal.  I will obtain estradiol, LH and FSH level today to confirm her menopausal status.  # Weight loss # Decreased appetite -Patient reports unintentional weight loss of 70 pounds in the past 7 months and decrease in appetite.  The weight loss is new and not related to her gastric sleeve procedure which she had few years ago.  I discussed with the patient that it is less likely to be related to her breast cancer considering there were no abnormal lymph nodes but to make sure with significant weight loss I will go ahead and obtain a CT chest abdomen pelvis without contrast and bone scan.  She is allergic to contrast.  -Check TSH level.  # Strong family history of breast cancer -Scheduled to see genetics today.  # Macrocytosis without anemia -Obtain B12 and folate  # Extreme fatigue -Will check iron panel and TSH.  Reports had  sleep study in the past which showed very mild OSA and did not think would benefit from CPAP.  # Right-sided hemiparesis -Due to acute spinal cord injury during spinal stimulator placement.  MRI brain from October 2023 with no evidence of stroke  # Chronic back pain status post lumbar fusion -On duloxetine, muscle relaxers  # Orthostasis -Chronic.  On  midodrine and fludrocortisone  #s/p gastric sleeve procedure for weight loss  # Personal history of cervical cancer s/p cryotherapy.  Tells me gets regular Pap smears.   Orders Placed This Encounter  Procedures   NM Bone Scan Whole Body    Standing Status:   Future    Standing Expiration Date:   12/08/2023    Order Specific Question:   If indicated for the ordered procedure, I authorize the administration of a radiopharmaceutical per Radiology protocol    Answer:   Yes    Order Specific Question:   Is the patient pregnant?    Answer:   No    Order Specific Question:   Preferred imaging location?    Answer:   [facility]   CT CHEST ABDOMEN PELVIS WO CONTRAST    Standing Status:   Future    Standing Expiration Date:   12/08/2023    Order Specific Question:   Is patient pregnant?    Answer:   No    Order Specific Question:   Preferred imaging location?    Answer:   [facility]    Order Specific Question:   Is Oral Contrast requested for this exam?    Answer:   Yes, Per Radiology protocol    Order Specific Question:   Does the patient have a contrast media/X-ray dye allergy?    Answer:   Yes   CBC with Differential/Platelet    Standing Status:   Future    Standing Expiration Date:   12/08/2023   Comprehensive metabolic panel    Standing Status:   Future    Standing Expiration Date:   12/08/2023   Vitamin B12    Standing Status:   Future    Standing Expiration Date:   12/08/2023   Folate    Standing Status:   Future    Standing Expiration Date:   12/08/2023   Estradiol    Standing Status:   Future    Standing Expiration Date:   12/08/2023   FSH/LH    Standing Status:   Future    Standing Expiration Date:   12/08/2023   Iron and TIBC    Standing Status:   Future    Standing Expiration Date:   12/08/2023   Ferritin    Standing Status:   Future    Standing Expiration Date:   12/08/2023   TSH    Standing Status:   Future    Standing Expiration Date:   12/08/2023   Ambulatory  referral to Social Work    Referral Priority:   Routine    Referral Type:   Consultation    Referral Reason:   Specialty Services Required    Number of Visits Requested:   1    The total time spent in the appointment was 60 minutes encounter with patients including review of chart and various tests results, discussions about plan of care and coordination of care plan   All questions were answered. The patient knows to call the clinic with any problems, questions or concerns. No barriers to learning was detected.  [name], MD 4/5/202412:02 PM  HISTORY OF PRESENTING ILLNESS:  Vanessa Medina 52 y.o. female with pmh of Right-sided hemiparesis as complication from permanent spinal stimulator placement, orthostasis on midodrine and fludrocortisone, hypertension, migraine, GERD, cholecystectomy, lumbar fusion in December 2023, gastric sleeve was referred to medical oncology for newly diagnosed left breast hormone positive cancer.  Patient was seen today.  She has chronic drooping of the left eye since the event from spinal stimulator placement.  Reports 70 pound weight loss unintentional in the past several months.  Decrease in appetite.  She reports profound fatigue in the past several months and tells me that she can just go to sleep anytime.  Has some soreness in the left breast even prior to the biopsy.  I have reviewed her chart and materials related to her cancer extensively and collaborated history with the patient. Summary of oncologic history is as follows: Oncology History  Breast cancer  11/23/2022 Mammogram   Screening mammogram-   FINDINGS: In the left breast, possible masses warrant further evaluation. In the right breast, no findings suspicious for malignancy.  Diagnostic mammogram and [name] (11/29/2022) Mass 1:   At 1 o'clock 3 cm from the nipple, there is an irregular hypoechoic mass with irregular margins. There is associated sonographic architectural distortion. It  measures approximately 8 x 7 by 8 mm.   Mass 2:   At 1:30 1 cm from the nipple, there is an oval mildly hypoechoic mass with irregular margins. It measures 9 x 7 by 9 mm.   Mass 3:   At 2 o'clock 1 cm from the nipple, there is an oval hypoechoic mass with mildly angular margins. It measures 6 by 8 x 4 mm.   There is approximately 2.1 cm between mass 1 and mass 3. Masses 1-3 span approximately 3.3 cm.   Targeted ultrasound was performed of the LEFT axilla. No suspicious axillary lymph nodes are visualized.   IMPRESSION: 1. There are 3 masses which extend along a ductal ray of the LEFT breast. One of these masses (mass 1) is a highly suspicious 8 mm mass at the site of screening mammographic concern at 1 o'clock 3 cm from the nipple with associated architectural distortion. The 2 other masses extend anteriorly from mass 1 towards the nipple and span in total 3.7 cm. Recommend 2 site ultrasound-guided biopsy for definitive characterization with preferential sampling of mass 1.  2. No suspicious LEFT axillary adenopathy.   12/06/2022 Initial Biopsy   DIAGNOSIS: A. BREAST, LEFT AT 1:00, 3 CM FROM THE NIPPLE; ULTRASOUND-GUIDED CORE NEEDLE BIOPSY: - INVASIVE MAMMARY CARCINOMA, NO SPECIAL TYPE.  Size of invasive carcinoma: 12 mm in this sample Histologic grade of invasive carcinoma: Grade 1                      Glandular/tubular differentiation score: 2                      Nuclear pleomorphism score: 2                      Mitotic rate score: 1                      Total score: 5 Ductal carcinoma in situ: Present, intermediate grade Lymphovascular invasion: Not identified  LEFT AT 1:00, 3 CM FN;  HEART-SHAPED CLIP  Estrogen Receptor (ER) Status: POSITIVE          Percentage of cells with nuclear positivity:  Greater than 90%          Average intensity of staining: Strong   Progesterone Receptor (PgR) Status: POSITIVE          Percentage of cells with nuclear positivity:  Greater than 90%          Average intensity of staining: Strong   HER2 (by immunohistochemistry): NEGATIVE (Score 1+)   B. BREAST, LEFT AT 2:00, 1 CM FROM THE NIPPLE; ULTRASOUND-GUIDED CORE NEEDLE BIOPSY: - INVASIVE MAMMARY CARCINOMA, WITH MUCINOUS FEATURES.  Estrogen Receptor (ER) Status: POSITIVE          Percentage of cells with nuclear positivity: Greater than 90%          Average intensity of staining: Strong   Progesterone Receptor (PgR) Status: POSITIVE          Percentage of cells with nuclear positivity: Greater than 90%          Average intensity of staining: Strong   HER2 (by immunohistochemistry): NEGATIVE (Score 0)  Size of invasive carcinoma: 4 mm in this sample Histologic grade of invasive carcinoma: Grade 2                      Glandular/tubular differentiation score: 3                      Nuclear pleomorphism score: 2                      Mitotic rate score: 1                      Total score: 6 Ductal carcinoma in situ: Not identified Lymphovascular invasion: Not identified     12/07/2022 Cancer Staging   Staging form: Breast, AJCC 8th Edition - Clinical stage from 12/06/2022: Stage IA (cT1b, cN0, cM0, G2, ER+, PR+, HER2-) - Signed by [name], MD on 12/08/2022 Stage prefix: Initial diagnosis Histologic grading system: 3 grade system      MEDICAL HISTORY:  Past Medical History:  Diagnosis Date   Anemia    Cervical cancer    Family history of adverse reaction to anesthesia     " my mother takes a long time time wake up"   GERD (gastroesophageal reflux disease)    Headache    migraine   History of dysplastic nevus 01/18/2018   right shoulder, recurrent dysplastic nevus   History of shingles    Hypertension    Migraine    Multiple allergies    Obesity    Osteoarthritis    left hip   PONV (postoperative nausea and vomiting)    Sleep apnea    does not wear CPAP   Stroke 08/11/2020   Wears glasses     SURGICAL HISTORY: Past Surgical  History:  Procedure Laterality Date   ABDOMINAL HYSTERECTOMY     APPENDECTOMY     BACK SURGERY     BILIOPANCREATIC DIVERSION     with duodenal switch laparoscopic    BREAST BIOPSY Left 12/06/2022   u/s bx,1:00 heart clip, path pending   BREAST BIOPSY Left 12/06/2022   [name] bx 2:00 ribbon clip path pending   BREAST BIOPSY Left 12/06/2022   [name] LT BREAST BX W LOC DEV EA ADD LESION IMG BX SPEC US GUIDE 12/06/2022 [facility]   BREAST BIOPSY Left 12/06/2022   [name] LT BREAST BX W LOC DEV 1ST LESION IMG BX SPEC US GUIDE 12/06/2022 [facility]  CHOLECYSTECTOMY     COLONOSCOPY N/A 06/27/2021   Procedure: COLONOSCOPY;  Surgeon: Jaynie Collins, DO;  Location: [facility] ENDOSCOPY;  Service: Gastroenterology;  Laterality: N/A;   DIAGNOSTIC LAPAROSCOPY     LOA   ESOPHAGOGASTRODUODENOSCOPY N/A 06/27/2021   Procedure: ESOPHAGOGASTRODUODENOSCOPY (EGD);  Surgeon: Jaynie Collins, DO;  Location: [facility] ENDOSCOPY;  Service: Gastroenterology;  Laterality: N/A;   ESOPHAGOGASTRODUODENOSCOPY (EGD) WITH PROPOFOL N/A 07/25/2017   Procedure: ESOPHAGOGASTRODUODENOSCOPY (EGD) WITH PROPOFOL;  Surgeon: Scot Jun, MD;  Location: [facility] ENDOSCOPY;  Service: Endoscopy;  Laterality: N/A;   KNEE ARTHROSCOPY     LAPAROSCOPIC GASTRIC SLEEVE RESECTION WITH HIATAL HERNIA REPAIR     LUMBAR FUSION  08/29/2022   Revision Lumbar two through five Laminectomy & Fusion with Transforaminal Lumbar interbody fusion   TONSILLECTOMY     TOTAL HIP ARTHROPLASTY Left 01/26/2016   Procedure: LEFT TOTAL HIP ARTHROPLASTY ANTERIOR APPROACH;  Surgeon: Tarry Kos, MD;  Location: [facility] OR;  Service: Orthopedics;  Laterality: Left;   TRANSFORAMINAL LUMBAR INTERBODY FUSION (TLIF) WITH PEDICLE SCREW FIXATION 3 LEVEL  08/2019   Vira Browns, MD L2-L5    SOCIAL HISTORY: Social History   Socioeconomic History   Marital status: Divorced    Spouse name: Not on file   Number of children: 1   Years of education: Not on file   Highest  education level: Not on file  Occupational History   Not on file  Tobacco Use   Smoking status: Never   Smokeless tobacco: Never  Vaping Use   Vaping Use: Never used  Substance and Sexual Activity   Alcohol use: Yes    Comment: occasional wine   Drug use: No   Sexual activity: Not Currently  Other Topics Concern   Not on file  Social History Narrative   Right handed    Uses cane to walk    Social Determinants of Health   Financial Resource Strain: Not on file  Food Insecurity: Food Insecurity Present (12/08/2022)   Hunger Vital Sign    Worried About Running Out of Food in the Last Year: Often true    Ran Out of Food in the Last Year: Often true  Transportation Needs: No Transportation Needs (12/08/2022)   PRAPARE - Administrator, Civil Service (Medical): No    Lack of Transportation (Non-Medical): No  Physical Activity: Not on file  Stress: Not on file  Social Connections: Not on file  Intimate Partner Violence: Not At Risk (12/08/2022)   Humiliation, Afraid, Rape, and Kick questionnaire    Fear of Current or Ex-Partner: No    Emotionally Abused: No    Physically Abused: No    Sexually Abused: No    FAMILY HISTORY: Family History  Problem Relation Age of Onset   Renal Disease Mother    Hypertension Mother    Sudden Cardiac Death Mother    Heart failure Mother    Valvular heart disease Mother    Heart disease Mother    Breast cancer Maternal Grandmother    Stroke Brother    Heart attack Brother 22   Diabetes Other     ALLERGIES:  is allergic to bee venom, latex, meperidine hcl, morphine, shellfish allergy, codeine, meperidine hcl, oxycodone-acetaminophen, acetaminophen, bee pollen, iodinated contrast media, oxycodone, pentazocine, oxycodone-acetaminophen, pentazocine lactate, propoxyphene, and propoxyphene n-acetaminophen.  MEDICATIONS:  Current Outpatient Medications  Medication Sig Dispense Refill   aspirin EC 81 MG tablet Take 81 mg by mouth daily.  Swallow whole.  azelastine (ASTELIN) 0.1 % nasal spray Place into the nose.     baclofen (LIORESAL) 10 MG tablet Take 1 tablet (10 mg total) by mouth 3 (three) times daily. - for spasms 270 tablet 3   diclofenac Sodium (VOLTAREN) 1 % GEL APPLY 4 GRAMS TO AFFECTED  AREA(S) TOPICALLY 4 TIMES DAILY 1500 g 1   DULoxetine (CYMBALTA) 60 MG capsule TAKE 1 CAPSULE BY MOUTH AT  NIGHT for nerve pain 90 capsule 3   EPINEPHrine 0.3 mg/0.3 mL IJ SOAJ injection SMARTSIG:0.3 Milliliter(s) IM Once PRN     fludrocortisone (FLORINEF) 0.1 MG tablet Take 2 tablets (0.2 mg total) by mouth daily. For orthostatic hypotension- 180 tablet 3   gabapentin (NEURONTIN) 300 MG capsule Take 1 capsule (300 mg total) by mouth at bedtime. For nerve pain 90 capsule 3   midodrine (PROAMATINE) 10 MG tablet Take 1 tablet (10 mg total) by mouth 3 (three) times daily. For orthostatic hypotension 270 tablet 3   montelukast (SINGULAIR) 10 MG tablet TAKE 1 TABLET BY MOUTH AT  BEDTIME FOR ALLERGIES 90 tablet 3   Multiple Vitamin (MULTIVITAMIN WITH MINERALS) TABS tablet Take 1 tablet by mouth 2 (two) times daily.     MYRBETRIQ 25 MG TB24 tablet Take 25 mg by mouth daily.     pramipexole (MIRAPEX) 0.125 MG tablet Take by mouth.     pyridOXINE (VITAMIN B-6) 100 MG tablet Take 1 tablet (100 mg total) by mouth daily. 90 tablet 1   Thiamine HCl (VITAMIN B-1) 250 MG tablet Take 1 tablet (250 mg total) by mouth daily. 90 tablet 1   topiramate (TOPAMAX) 100 MG tablet TAKE 1 TABLET BY MOUTH AT  BEDTIME 90 tablet 3   vitamin B-12 (CYANOCOBALAMIN) 1000 MCG tablet Take 1 tablet (1,000 mcg total) by mouth daily. 90 tablet 1   fluticasone (FLONASE) 50 MCG/ACT nasal spray Place 1-2 sprays into both nostrils daily.     No current facility-administered medications for this visit.    REVIEW OF SYSTEMS:   Pertinent information mentioned in HPI All other systems were reviewed with the patient and are negative.  PHYSICAL EXAMINATION: ECOG PERFORMANCE  STATUS: 2 - Symptomatic, <50% confined to bed  Vitals:   12/08/22 1119  Pulse: 72  Resp: 18  Temp: 98 F (36.7 C)  SpO2: 100%   Filed Weights   12/08/22 1119  Weight: 232 lb 8 oz (105.5 kg)    GENERAL:alert, no distress and comfortable SKIN: skin color, texture, turgor are normal, no rashes or significant lesions EYES: normal, conjunctiva are pink and non-injected, sclera clear OROPHARYNX:no exudate, no erythema and lips, buccal mucosa, and tongue normal  NECK: supple, thyroid normal size, non-tender, without nodularity LYMPH:  no palpable lymphadenopathy in the cervical, axillary or inguinal LUNGS: clear to auscultation and percussion with normal breathing effort HEART: regular rate & rhythm and no murmurs and no lower extremity edema ABDOMEN:abdomen soft, non-tender and normal bowel sounds Musculoskeletal:no cyanosis of digits and no clubbing  PSYCH: alert & oriented x 3 with fluent speech NEURO: no focal motor/sensory deficits  LABORATORY DATA:  I have reviewed the data as listed Lab Results  Component Value Date   WBC 4.4 08/30/2020   HGB 9.3 (L) 08/30/2020   HCT 28.0 (L) 08/30/2020   MCV 100.4 (H) 08/30/2020   PLT 202 08/30/2020   No results for input(s): "NA", "K", "CL", "CO2", "GLUCOSE", "BUN", "CREATININE", "CALCIUM", "GFRNONAA", "GFRAA", "PROT", "ALBUMIN", "AST", "ALT", "ALKPHOS", "BILITOT", "BILIDIR", "IBILI" in the last 8760 hours.  RADIOGRAPHIC STUDIES: I have personally reviewed the radiological images as listed and agreed with the findings in the report. [name] LT BREAST BX W LOC DEV 1ST LESION IMG BX SPEC US GUIDE  Addendum Date: 12/07/2022   ADDENDUM REPORT: 12/07/2022 14:26 ADDENDUM: Pathology revealed A. BREAST, LEFT AT 1:00, 3 CM FROM THE NIPPLE; ULTRASOUND-GUIDED CORE NEEDLE BIOPSY:GRADE 1 INVASIVE MAMMARY CARCINOMA, NO SPECIAL TYPE - ductal carcinoma in situ: Present, intermediate grade. This was found to be concordant by Dr. Meda Klinefelter. Pathology  revealed B. BREAST, LEFT AT 2:00, 1 CM FROM THE NIPPLE; ULTRASOUND-GUIDED CORE NEEDLE BIOPSY:GRADE 2 INVASIVE MAMMARY CARCINOMA, WITH MUCINOUS FEATURES. This was found to be concordant by Dr. Meda Klinefelter. Pathology results were discussed with the patient by telephone. The patient reported doing well after the biopsies with tenderness at the sites. Post biopsy instructions were reviewed and questions were answered. The patient was encouraged to call [facility] for any additional concerns. A surgical and medical oncologist referral will be arranged by Irving Shows RN, Oncology Nurse Navigator of [facility] of [facility]. NOTE: Consider breast MRI to evaluate for nipple involvement if breast conservation surgery is being considered. The third non-sampled mass at 1:30 (between site A and B) is sonographically similar in appearance to the biopsy proven [facility] with mucinous features. Pathology results reported by Collene Mares RN on 12/07/2022. Electronically Signed   By: Meda Klinefelter M.D.   On: 12/07/2022 14:26   Result Date: 12/07/2022 CLINICAL DATA:  Patient presents for 2 site ultrasound-guided biopsy. EXAM: ULTRASOUND GUIDED LEFT BREAST CORE NEEDLE BIOPSY x2 COMPARISON:  Previous exam(s). PROCEDURE: I met with the patient and we discussed the procedure of ultrasound-guided biopsy, including benefits and alternatives. We discussed the high likelihood of a successful procedure. We discussed the risks of the procedure, including infection, bleeding, tissue injury, clip migration, and inadequate sampling. Informed written consent was given. The usual time-out protocol was performed immediately prior to the procedure. Site 1: LEFT breast 1 o'clock 3 cm from the nipple Lesion quadrant: Upper outer quadrant Using sterile technique and 1% lidocaine and 1% lidocaine with epinephrine as local anesthetic, under direct ultrasound visualization, a 14 gauge  spring-loaded device was used to perform biopsy of a mass at 1 o'clock using a lateral approach. At the conclusion of the procedure a heart shaped tissue marker clip was deployed into the biopsy cavity. Follow up 2 view mammogram was performed and dictated separately. Site 2: LEFT breast 2 o'clock 1 cm from the nipple Lesion quadrant: Upper outer quadrant Using sterile technique and 1% lidocaine and 1% lidocaine with epinephrine as local anesthetic, under direct ultrasound visualization, a 14 gauge spring-loaded device was used to perform biopsy of a mass at 2 o'clock using a lateral approach. At the conclusion of the procedure a RIBBON shaped tissue marker clip was deployed into the biopsy cavity. Follow up 2 view mammogram was performed and dictated separately. IMPRESSION: Ultrasound guided biopsy of 2 LEFT breast masses. No apparent complications. Electronically Signed: By: Meda Klinefelter M.D. On: 12/06/2022 09:29   [name] LT BREAST BX W LOC DEV EA ADD LESION IMG BX SPEC US GUIDE  Addendum Date: 12/07/2022   ADDENDUM REPORT: 12/07/2022 14:26 ADDENDUM: Pathology revealed A. BREAST, LEFT AT 1:00, 3 CM FROM THE NIPPLE; ULTRASOUND-GUIDED CORE NEEDLE BIOPSY:GRADE 1 INVASIVE MAMMARY CARCINOMA, NO SPECIAL TYPE - ductal carcinoma in situ: Present, intermediate grade. This was found to be concordant by Dr. Meda Klinefelter. Pathology  revealed B. BREAST, LEFT AT 2:00, 1 CM FROM THE NIPPLE; ULTRASOUND-GUIDED CORE NEEDLE BIOPSY:GRADE 2 INVASIVE MAMMARY CARCINOMA, WITH MUCINOUS FEATURES. This was found to be concordant by Dr. Meda Klinefelter. Pathology results were discussed with the patient by telephone. The patient reported doing well after the biopsies with tenderness at the sites. Post biopsy instructions were reviewed and questions were answered. The patient was encouraged to call [facility] for any additional concerns. A surgical and medical oncologist referral will  be arranged by Irving Shows RN, Oncology Nurse Navigator of [facility] of [facility]. NOTE: Consider breast MRI to evaluate for nipple involvement if breast conservation surgery is being considered. The third non-sampled mass at 1:30 (between site A and B) is sonographically similar in appearance to the biopsy proven [facility] with mucinous features. Pathology results reported by Collene Mares RN on 12/07/2022. Electronically Signed   By: Meda Klinefelter M.D.   On: 12/07/2022 14:26   Result Date: 12/07/2022 CLINICAL DATA:  Patient presents for 2 site ultrasound-guided biopsy. EXAM: ULTRASOUND GUIDED LEFT BREAST CORE NEEDLE BIOPSY x2 COMPARISON:  Previous exam(s). PROCEDURE: I met with the patient and we discussed the procedure of ultrasound-guided biopsy, including benefits and alternatives. We discussed the high likelihood of a successful procedure. We discussed the risks of the procedure, including infection, bleeding, tissue injury, clip migration, and inadequate sampling. Informed written consent was given. The usual time-out protocol was performed immediately prior to the procedure. Site 1: LEFT breast 1 o'clock 3 cm from the nipple Lesion quadrant: Upper outer quadrant Using sterile technique and 1% lidocaine and 1% lidocaine with epinephrine as local anesthetic, under direct ultrasound visualization, a 14 gauge spring-loaded device was used to perform biopsy of a mass at 1 o'clock using a lateral approach. At the conclusion of the procedure a heart shaped tissue marker clip was deployed into the biopsy cavity. Follow up 2 view mammogram was performed and dictated separately. Site 2: LEFT breast 2 o'clock 1 cm from the nipple Lesion quadrant: Upper outer quadrant Using sterile technique and 1% lidocaine and 1% lidocaine with epinephrine as local anesthetic, under direct ultrasound visualization, a 14 gauge spring-loaded device was used to perform biopsy of a mass at 2 o'clock using a lateral  approach. At the conclusion of the procedure a RIBBON shaped tissue marker clip was deployed into the biopsy cavity. Follow up 2 view mammogram was performed and dictated separately. IMPRESSION: Ultrasound guided biopsy of 2 LEFT breast masses. No apparent complications. Electronically Signed: By: Meda Klinefelter M.D. On: 12/06/2022 09:29   MM CLIP PLACEMENT LEFT  Result Date: 12/06/2022 CLINICAL DATA:  Status post 2 site ultrasound-guided biopsy EXAM: 3D DIAGNOSTIC LEFT MAMMOGRAM POST ULTRASOUND BIOPSY COMPARISON:  Previous exam(s). FINDINGS: 3D Mammographic images were obtained following ultrasound guided biopsy of mass at 1 o'clock 3 cm from the nipple. The heart biopsy marking clip is in expected position at the site of biopsy. 3D Mammographic images were obtained following ultrasound guided biopsy of a mass at 2 o'clock 1 cm from the nipple. The RIBBON biopsy marking clip is in expected position at the site of biopsy. Biopsy clips are approximately 3.1 cm apart. IMPRESSION: 1. Appropriate positioning of the heart shaped biopsy marking clip at the site of biopsy in the LEFT breast at 1 o'clock. 2. Appropriate positioning of the RIBBON shaped biopsy marking clip at the site of biopsy in the LEFT breast at 2 o'clock. 3. Biopsy clips are approximately  3.1 cm apart. Final Assessment: Post Procedure Mammograms for Marker Placement Electronically Signed   By: Meda Klinefelter M.D.   On: 12/06/2022 09:27  MM 3D DIAGNOSTIC MAMMOGRAM UNILATERAL LEFT BREAST  Result Date: 11/29/2022 CLINICAL DATA:  Callback for LEFT breast masses EXAM: DIGITAL DIAGNOSTIC UNILATERAL LEFT MAMMOGRAM WITH TOMOSYNTHESIS; ULTRASOUND LEFT BREAST LIMITED TECHNIQUE: Left digital diagnostic mammography and breast tomosynthesis was performed.; Targeted ultrasound examination of the left breast was performed. COMPARISON:  Previous exam(s). ACR Breast Density Category b: There are scattered areas of fibroglandular density. FINDINGS: Mass 1:  Spot compression tomosynthesis views confirm persistence of an irregular mass with associated architectural distortion in the LEFT upper outer breast at anterior depth. This is best seen on spot CC slice 24, spot MLO slice 36 and ML slice 43. Mass 2: Spot compression tomosynthesis views confirm persistence of an oval mass with irregular borders in the LEFT upper outer breast at anterior depth. This is best seen on spot CC slice 24, ML slice 47 and spot MLO slice 29. This is located between masses 1 and 3. Mass 3: Spot compression tomosynthesis views confirm persistence of an oval mass with irregular borders in the LEFT upper outer breast immediately in the retroareolar breast. This is best seen on ML slice 48 and CC slice 28. These masses span approximately 3.7 cm in total. On physical exam, there is a firm mass appreciated in the LEFT upper outer breast at anterior depth. Targeted ultrasound was performed of the LEFT breast. Mass 1: At 1 o'clock 3 cm from the nipple, there is an irregular hypoechoic mass with irregular margins. There is associated sonographic architectural distortion. It measures approximately 8 x 7 by 8 mm. Mass 2: At 1:30 1 cm from the nipple, there is an oval mildly hypoechoic mass with irregular margins. It measures 9 x 7 by 9 mm. Mass 3: At 2 o'clock 1 cm from the nipple, there is an oval hypoechoic mass with mildly angular margins. It measures 6 by 8 x 4 mm. There is approximately 2.1 cm between mass 1 and mass 3. Masses 1-3 span approximately 3.3 cm. Targeted ultrasound was performed of the LEFT axilla. No suspicious axillary lymph nodes are visualized. IMPRESSION: 1. There are 3 masses which extend along a ductal ray of the LEFT breast. One of these masses (mass 1) is a highly suspicious 8 mm mass at the site of screening mammographic concern at 1 o'clock 3 cm from the nipple with associated architectural distortion. The 2 other masses extend anteriorly from mass 1 towards the nipple and  span in total 3.7 cm. Recommend 2 site ultrasound-guided biopsy for definitive characterization with preferential sampling of mass 1. 2. No suspicious LEFT axillary adenopathy. RECOMMENDATION: LEFT breast ultrasound-guided biopsy x2 I have discussed the findings and recommendations with the patient. The biopsy procedure was discussed with the patient and questions were answered. Patient expressed their understanding of the biopsy recommendation. Patient will be scheduled for biopsy at her earliest convenience by the schedulers. Ordering provider will be notified. If applicable, a reminder letter will be sent to the patient regarding the next appointment. BI-RADS CATEGORY  5: Highly suggestive of malignancy. Electronically Signed   By: Meda Klinefelter M.D.   On: 11/29/2022 12:43  [name] LIMITED ULTRASOUND INCLUDING AXILLA LEFT BREAST   Result Date: 11/29/2022 CLINICAL DATA:  Callback for LEFT breast masses EXAM: DIGITAL DIAGNOSTIC UNILATERAL LEFT MAMMOGRAM WITH TOMOSYNTHESIS; ULTRASOUND LEFT BREAST LIMITED TECHNIQUE: Left digital diagnostic mammography and breast tomosynthesis was performed.; Targeted  ultrasound examination of the left breast was performed. COMPARISON:  Previous exam(s). ACR Breast Density Category b: There are scattered areas of fibroglandular density. FINDINGS: Mass 1: Spot compression tomosynthesis views confirm persistence of an irregular mass with associated architectural distortion in the LEFT upper outer breast at anterior depth. This is best seen on spot CC slice 24, spot MLO slice 36 and ML slice 43. Mass 2: Spot compression tomosynthesis views confirm persistence of an oval mass with irregular borders in the LEFT upper outer breast at anterior depth. This is best seen on spot CC slice 24, ML slice 47 and spot MLO slice 29. This is located between masses 1 and 3. Mass 3: Spot compression tomosynthesis views confirm persistence of an oval mass with irregular borders in the LEFT upper outer  breast immediately in the retroareolar breast. This is best seen on ML slice 48 and CC slice 28. These masses span approximately 3.7 cm in total. On physical exam, there is a firm mass appreciated in the LEFT upper outer breast at anterior depth. Targeted ultrasound was performed of the LEFT breast. Mass 1: At 1 o'clock 3 cm from the nipple, there is an irregular hypoechoic mass with irregular margins. There is associated sonographic architectural distortion. It measures approximately 8 x 7 by 8 mm. Mass 2: At 1:30 1 cm from the nipple, there is an oval mildly hypoechoic mass with irregular margins. It measures 9 x 7 by 9 mm. Mass 3: At 2 o'clock 1 cm from the nipple, there is an oval hypoechoic mass with mildly angular margins. It measures 6 by 8 x 4 mm. There is approximately 2.1 cm between mass 1 and mass 3. Masses 1-3 span approximately 3.3 cm. Targeted ultrasound was performed of the LEFT axilla. No suspicious axillary lymph nodes are visualized. IMPRESSION: 1. There are 3 masses which extend along a ductal ray of the LEFT breast. One of these masses (mass 1) is a highly suspicious 8 mm mass at the site of screening mammographic concern at 1 o'clock 3 cm from the nipple with associated architectural distortion. The 2 other masses extend anteriorly from mass 1 towards the nipple and span in total 3.7 cm. Recommend 2 site ultrasound-guided biopsy for definitive characterization with preferential sampling of mass 1. 2. No suspicious LEFT axillary adenopathy. RECOMMENDATION: LEFT breast ultrasound-guided biopsy x2 I have discussed the findings and recommendations with the patient. The biopsy procedure was discussed with the patient and questions were answered. Patient expressed their understanding of the biopsy recommendation. Patient will be scheduled for biopsy at her earliest convenience by the schedulers. Ordering provider will be notified. If applicable, a reminder letter will be sent to the patient regarding  the next appointment. BI-RADS CATEGORY  5: Highly suggestive of malignancy. Electronically Signed   By: Meda KlinefelterStephanie  Peacock M.D.   On: 11/29/2022 12:43  MM 3D SCREEN BREAST BILATERAL  Result Date: 11/24/2022 CLINICAL DATA:  Screening. EXAM: DIGITAL SCREENING BILATERAL MAMMOGRAM WITH TOMOSYNTHESIS AND CAD TECHNIQUE: Bilateral screening digital craniocaudal and mediolateral oblique mammograms were obtained. Bilateral screening digital breast tomosynthesis was performed. The images were evaluated with computer-aided detection. COMPARISON:  Previous exam(s). ACR Breast Density Category b: There are scattered areas of fibroglandular density. FINDINGS: In the left breast, possible masses warrant further evaluation. In the right breast, no findings suspicious for malignancy. IMPRESSION: Further evaluation is suggested for possible masses in the left breast. RECOMMENDATION: Diagnostic mammogram and possibly ultrasound of the left breast. (Code:FI-L-21M) The patient will be contacted regarding  the findings, and additional imaging will be scheduled. BI-RADS CATEGORY  0: Incomplete: Need additional imaging evaluation. Electronically Signed   By: Amie Portland M.D.   On: 11/24/2022 13:05

## 2022-12-08 NOTE — Progress Notes (Signed)
Patient here today for initial visit regarding breast cancer. 

## 2022-12-08 NOTE — Progress Notes (Signed)
REFERRING PROVIDER: [name], MD [address] [place],  [place] [address]  PRIMARY PROVIDER:  [name], MD  PRIMARY REASON FOR VISIT:  1. Malignant neoplasm of overlapping sites of left breast in female, estrogen receptor positive   2. Family history of breast cancer      HISTORY OF PRESENT ILLNESS:   Vanessa Medina, a 52 y.o. female, was seen for a [place] cancer genetics consultation at the request of Dr. Alena Bills due to a personal and family history of breast cancer.  Vanessa Medina presents to clinic today to discuss the possibility of a hereditary predisposition to cancer, genetic testing, and to further clarify her future cancer risks, as well as potential cancer risks for family members.   In 2024, at the age of 56, Vanessa Medina was diagnosed with invasive mammary carcinoma of the left breast, ER/PR+ HER2-. Vanessa Medina meets with Dr. Alena Bills today and her surgeon, Dr. Everlene Farrier, on Monday.   CANCER HISTORY:  In 2024, at the age of 31, Vanessa Medina was diagnosed with invasive mammary carcinoma of the left breast, ER/PR+ HER2-. Vanessa Medina meets with Dr. Alena Bills today and her surgeon, Dr. Everlene Farrier, on Monday.   RISK FACTORS:  Menarche was at age 72.  First live birth at age 42.  Ovaries intact: yes. Has 1 ovary Hysterectomy: yes.  Colonoscopy: yes; 2022  Past Medical History:  Diagnosis Date   Anemia    Cervical cancer    Family history of adverse reaction to anesthesia     " my mother takes a long time time wake up"   GERD (gastroesophageal reflux disease)    Headache    migraine   History of dysplastic nevus 01/18/2018   right shoulder, recurrent dysplastic nevus   History of shingles    Hypertension    Migraine    Multiple allergies    Obesity    Osteoarthritis    left hip   PONV (postoperative nausea and vomiting)    Sleep apnea    does not wear CPAP   Stroke 08/11/2020   Wears glasses     Past Surgical History:  Procedure Laterality Date   ABDOMINAL  HYSTERECTOMY     APPENDECTOMY     BACK SURGERY     BILIOPANCREATIC DIVERSION     with duodenal switch laparoscopic    BREAST BIOPSY Left 12/06/2022   u/s bx,1:00 heart clip, path pending   BREAST BIOPSY Left 12/06/2022   [name] bx 2:00 ribbon clip path pending   BREAST BIOPSY Left 12/06/2022   [name] LT BREAST BX W LOC DEV EA ADD LESION IMG BX SPEC US GUIDE 12/06/2022 [facility]   BREAST BIOPSY Left 12/06/2022   [name] LT BREAST BX W LOC DEV 1ST LESION IMG BX SPEC US GUIDE 12/06/2022 [facility]   CHOLECYSTECTOMY     COLONOSCOPY N/A 06/27/2021   Procedure: COLONOSCOPY;  Surgeon: Jaynie Collins, DO;  Location: [facility] ENDOSCOPY;  Service: Gastroenterology;  Laterality: N/A;   DIAGNOSTIC LAPAROSCOPY     LOA   ESOPHAGOGASTRODUODENOSCOPY N/A 06/27/2021   Procedure: ESOPHAGOGASTRODUODENOSCOPY (EGD);  Surgeon: Jaynie Collins, DO;  Location: [facility] ENDOSCOPY;  Service: Gastroenterology;  Laterality: N/A;   ESOPHAGOGASTRODUODENOSCOPY (EGD) WITH PROPOFOL N/A 07/25/2017   Procedure: ESOPHAGOGASTRODUODENOSCOPY (EGD) WITH PROPOFOL;  Surgeon: Scot Jun, MD;  Location: [facility] ENDOSCOPY;  Service: Endoscopy;  Laterality: N/A;   KNEE ARTHROSCOPY     LAPAROSCOPIC GASTRIC SLEEVE RESECTION WITH HIATAL HERNIA REPAIR     LUMBAR FUSION  08/29/2022  Revision Lumbar two through five Laminectomy & Fusion with Transforaminal Lumbar interbody fusion   TONSILLECTOMY     TOTAL HIP ARTHROPLASTY Left 01/26/2016   Procedure: LEFT TOTAL HIP ARTHROPLASTY ANTERIOR APPROACH;  Surgeon: Tarry Kos, MD;  Location: [facility] OR;  Service: Orthopedics;  Laterality: Left;   TRANSFORAMINAL LUMBAR INTERBODY FUSION (TLIF) WITH PEDICLE SCREW FIXATION 3 LEVEL  08/2019   Vira Browns, MD L2-L5    FAMILY HISTORY:  We obtained a detailed, 4-generation family history.  Significant diagnoses are listed below: Family History  Problem Relation Age of Onset   Renal Disease Mother    Hypertension Mother    Sudden Cardiac Death  Mother    Heart failure Mother    Valvular heart disease Mother    Heart disease Mother    Breast cancer Maternal Grandmother    Stroke Brother    Heart attack Brother 28   Diabetes Other    Vanessa Medina has 1 son, 32. She has 1 brother, 27, no cancers.  Vanessa Medina mother died at 38. She has half siblings, patient has limited information about them. Maternal grandmother had breast cancer at unknown age, and four of her sisters also had breast cancer.  Vanessa Medina father currently had bladder and prostate cancer at 38 and is being treated for this at the [place]. Paternal uncle died of pancreatic cancer in his 65s. Grandmother had bladder cancer.   Vanessa Medina is unaware of previous family history of genetic testing for hereditary cancer risks. There is no reported Ashkenazi Jewish ancestry. There is no known consanguinity.    GENETIC COUNSELING ASSESSMENT: Vanessa Medina is a 52 y.o. female with a personal and family history of breast cancer which is somewhat suggestive of a hereditary cancer syndrome and predisposition to cancer. We, therefore, discussed and recommended the following at today's visit.   DISCUSSION: We discussed that approximately 5-10% of breast cancer is hereditary. Most cases of hereditary breast cancer are associated with BRCA1/BRCA2 genes, although there are other genes associated with hereditary cancer as well. Cancers and risks are gene specific. We discussed that testing is beneficial for several reasons including knowing about cancer risks, identifying potential screening and risk-reduction options that may be appropriate, and to understand if other family members could be at risk for cancer and allow them to undergo genetic testing.   We reviewed the characteristics, features and inheritance patterns of hereditary cancer syndromes. We also discussed genetic testing, including the appropriate family members to test, the process of testing, insurance coverage and  turn-around-time for results. We discussed the implications of a negative, positive and/or variant of uncertain significant result. We recommended Vanessa Medina pursue genetic testing for the [facility] Breast Cancer STAT+ Multi-Cancer+RNA gene panel.   Based on Vanessa Medina's personal and family history of cancer, she meets medical criteria for genetic testing. Despite that she meets criteria, she may still have an out of pocket cost. We discussed that if her out of pocket cost for testing is over $100, the laboratory will call and confirm whether she wants to proceed with testing.  If the out of pocket cost of testing is less than $100 she will be billed by the genetic testing laboratory.   PLAN: After considering the risks, benefits, and limitations, Vanessa Medina provided informed consent to pursue genetic testing and the blood sample was sent to [facility] for analysis of the Breast Cancer STAT+ Multi-Cancer+RNA panel. Results should be available within approximately 1 weeks' time, at which point they  will be disclosed by telephone to Vanessa Medina, as will any additional recommendations warranted by these results. Vanessa Medina will receive a summary of her genetic counseling visit and a copy of her results once available. This information will also be available in Epic.   Vanessa Medina's questions were answered to her satisfaction today. Our contact information was provided should additional questions or concerns arise. Thank you for the referral and allowing us to share in the care of your patient.   Lacy DuverneyBrianna Filbert Craze, MS, [facility] Genetic Counselor [email or web address] Phone: [phone]  The patient was seen for a total of 15 minutes in face-to-face genetic counseling.  Dr. Orlie DakinFinnegan was available for discussion regarding this case.   _______________________________________________________________________ For Office Staff:  Number of people involved in session: 1 Was an Intern/ student involved  with case: no

## 2022-12-09 LAB — FSH/LH
FSH: 37.1 m[IU]/mL
LH: 35.2 m[IU]/mL

## 2022-12-09 LAB — ESTRADIOL: Estradiol: 25.1 pg/mL

## 2022-12-11 ENCOUNTER — Encounter: Payer: Self-pay | Admitting: Licensed Clinical Social Worker

## 2022-12-11 ENCOUNTER — Inpatient Hospital Stay: Payer: Medicaid Other | Admitting: Licensed Clinical Social Worker

## 2022-12-11 ENCOUNTER — Encounter: Payer: Self-pay | Admitting: Surgery

## 2022-12-11 ENCOUNTER — Ambulatory Visit (INDEPENDENT_AMBULATORY_CARE_PROVIDER_SITE_OTHER): Payer: Medicaid Other | Admitting: Surgery

## 2022-12-11 VITALS — BP 137/81 | HR 67 | Temp 98.4°F | Ht 64.0 in | Wt 253.6 lb

## 2022-12-11 DIAGNOSIS — Z170 Estrogen receptor positive status [ER+]: Secondary | ICD-10-CM

## 2022-12-11 DIAGNOSIS — C50912 Malignant neoplasm of unspecified site of left female breast: Secondary | ICD-10-CM

## 2022-12-11 DIAGNOSIS — C50412 Malignant neoplasm of upper-outer quadrant of left female breast: Secondary | ICD-10-CM

## 2022-12-11 NOTE — Progress Notes (Signed)
[facility] Clinical Social Work  Clinical Social Work was referred by medical provider for assessment of psychosocial needs.  Clinical Social Worker attempted to contact patient by phone  to offer support and assess for needs.  CSW left voicemail with contact information and request for return call.    FA   Giani Betzold, LCSW  Clinical Social Worker [facility]        Patient is participating in a Managed Medicaid Plan:  Yes 

## 2022-12-11 NOTE — Progress Notes (Signed)
Patient ID: Vanessa Medina, female   DOB: Nov 06, 1970, 52 y.o.   MRN: [id number]  HPI Vanessa Medina is a 52 y.o. female with a recently diagnosed mammary carcinoma ER/PR positive HER2 negative Menarche at age 6, grandmother on her 4 sister with history of breast cancer.  Dad with history of bladder cancer.  Prior breast biopsies abnormal mammogram in 2022. Hysterectomy in 2009. G1P1. Did have a recent mammogram that I personally reviewed showing evidence of a left upper outer quadrant mass.  This was followed by an ultrasound showing 3 masses that are very anterior and close to the skin from 1:00 all the way to the 3:00.  The span of 3.7 cm. Serous of masses 1 and 2 showed mammary carcinoma ER/PR positive HER2 negative. He had her second lumbar fusion with cage about 3 months ago and is recovering.  She is using no pain medicine.  She is walking but still some soreness.  She denies any breast concerns no masses no breast discharge she only saw some skin dryness on the left side. Last hemoglobin is 11.9 and the rest of the labs including a CMP is normal. He is able to perform more than 4 METS of activity without any shortness of breath or chest pain.    HPI  Past Medical History:  Diagnosis Date   Anemia    Cervical cancer    Family history of adverse reaction to anesthesia     " my mother takes a long time time wake up"   GERD (gastroesophageal reflux disease)    Headache    migraine   History of dysplastic nevus 01/18/2018   right shoulder, recurrent dysplastic nevus   History of shingles    Hypertension    Migraine    Multiple allergies    Obesity    Osteoarthritis    left hip   PONV (postoperative nausea and vomiting)    Sleep apnea    does not wear CPAP   Stroke 08/11/2020   Wears glasses     Past Surgical History:  Procedure Laterality Date   ABDOMINAL HYSTERECTOMY     APPENDECTOMY     BACK SURGERY     BILIOPANCREATIC DIVERSION     with duodenal switch  laparoscopic    BREAST BIOPSY Left 12/06/2022   u/s bx,1:00 heart clip, path pending   BREAST BIOPSY Left 12/06/2022   [name] bx 2:00 ribbon clip path pending   BREAST BIOPSY Left 12/06/2022   [name] LT BREAST BX W LOC DEV EA ADD LESION IMG BX SPEC US GUIDE 12/06/2022 [facility]   BREAST BIOPSY Left 12/06/2022   [name] LT BREAST BX W LOC DEV 1ST LESION IMG BX SPEC US GUIDE 12/06/2022 [facility]   CHOLECYSTECTOMY     COLONOSCOPY N/A 06/27/2021   Procedure: COLONOSCOPY;  Surgeon: Jaynie Collins, DO;  Location: [facility] ENDOSCOPY;  Service: Gastroenterology;  Laterality: N/A;   DIAGNOSTIC LAPAROSCOPY     LOA   ESOPHAGOGASTRODUODENOSCOPY N/A 06/27/2021   Procedure: ESOPHAGOGASTRODUODENOSCOPY (EGD);  Surgeon: Jaynie Collins, DO;  Location: [facility] ENDOSCOPY;  Service: Gastroenterology;  Laterality: N/A;   ESOPHAGOGASTRODUODENOSCOPY (EGD) WITH PROPOFOL N/A 07/25/2017   Procedure: ESOPHAGOGASTRODUODENOSCOPY (EGD) WITH PROPOFOL;  Surgeon: Scot Jun, MD;  Location: [facility] ENDOSCOPY;  Service: Endoscopy;  Laterality: N/A;   KNEE ARTHROSCOPY     LAPAROSCOPIC GASTRIC SLEEVE RESECTION WITH HIATAL HERNIA REPAIR     LUMBAR FUSION  08/29/2022   Revision Lumbar two through five Laminectomy & Fusion with Transforaminal Lumbar  interbody fusion   TONSILLECTOMY     TOTAL HIP ARTHROPLASTY Left 01/26/2016   Procedure: LEFT TOTAL HIP ARTHROPLASTY ANTERIOR APPROACH;  Surgeon: Tarry KosNaiping M Xu, MD;  Location: [facility] OR;  Service: Orthopedics;  Laterality: Left;   TRANSFORAMINAL LUMBAR INTERBODY FUSION (TLIF) WITH PEDICLE SCREW FIXATION 3 LEVEL  08/2019   Vira BrownsJames Nitka, MD L2-L5    Family History  Problem Relation Age of Onset   Renal Disease Mother    Hypertension Mother    Sudden Cardiac Death Mother    Heart failure Mother    Valvular heart disease Mother    Heart disease Mother    Breast cancer Maternal Grandmother    Stroke Brother    Heart attack Brother [id number]   Diabetes Other     Social  History Social History   Tobacco Use   Smoking status: Never   Smokeless tobacco: Never  Vaping Use   Vaping Use: Never used  Substance Use Topics   Alcohol use: Yes    Comment: occasional wine   Drug use: No    Allergies  Allergen Reactions   Bee Venom Swelling   Latex Hives and Rash   Meperidine Hcl Other (See Comments)   Morphine Other (See Comments)    BRADYCARDIA   Shellfish Allergy Anaphylaxis   Codeine Nausea And Vomiting   Meperidine Hcl Other (See Comments)    BRADYCARDIA   Oxycodone-Acetaminophen Hives   Acetaminophen Itching   Bee Pollen Other (See Comments)    Unknown   Iodinated Contrast Media Other (See Comments)    Swelling  Other reaction(s): Other (See Comments)  Swelling   Swelling   Oxycodone Hives and Itching    Blisters on back   Pentazocine Nausea Only and Nausea And Vomiting   Oxycodone-Acetaminophen Itching   Pentazocine Lactate Nausea And Vomiting    REACTION: vomiting with Talwin NX   Propoxyphene Nausea Only and Nausea And Vomiting   Propoxyphene N-Acetaminophen Nausea And Vomiting    Current Outpatient Medications  Medication Sig Dispense Refill   aspirin EC 81 MG tablet Take 81 mg by mouth daily. Swallow whole.     azelastine (ASTELIN) 0.1 % nasal spray Place into the nose.     baclofen (LIORESAL) 10 MG tablet Take 1 tablet (10 mg total) by mouth 3 (three) times daily. - for spasms 270 tablet 3   diclofenac Sodium (VOLTAREN) 1 % GEL APPLY 4 GRAMS TO AFFECTED  AREA(S) TOPICALLY 4 TIMES DAILY 1500 g 1   DULoxetine (CYMBALTA) 60 MG capsule TAKE 1 CAPSULE BY MOUTH AT  NIGHT for nerve pain 90 capsule 3   EPINEPHrine 0.3 mg/0.3 mL IJ SOAJ injection SMARTSIG:0.3 Milliliter(s) IM Once PRN     fludrocortisone (FLORINEF) 0.1 MG tablet Take 2 tablets (0.2 mg total) by mouth daily. For orthostatic hypotension- 180 tablet 3   gabapentin (NEURONTIN) 300 MG capsule Take 1 capsule (300 mg total) by mouth at bedtime. For nerve pain 90 capsule 3    midodrine (PROAMATINE) 10 MG tablet Take 1 tablet (10 mg total) by mouth 3 (three) times daily. For orthostatic hypotension 270 tablet 3   montelukast (SINGULAIR) 10 MG tablet TAKE 1 TABLET BY MOUTH AT  BEDTIME FOR ALLERGIES 90 tablet 3   Multiple Vitamin (MULTIVITAMIN WITH MINERALS) TABS tablet Take 1 tablet by mouth 2 (two) times daily.     MYRBETRIQ 25 MG TB24 tablet Take 25 mg by mouth daily.     pramipexole (MIRAPEX) 0.125 MG tablet Take by mouth.  pyridOXINE (VITAMIN B-6) 100 MG tablet Take 1 tablet (100 mg total) by mouth daily. 90 tablet 1   Thiamine HCl (VITAMIN B-1) 250 MG tablet Take 1 tablet (250 mg total) by mouth daily. 90 tablet 1   topiramate (TOPAMAX) 100 MG tablet TAKE 1 TABLET BY MOUTH AT  BEDTIME 90 tablet 3   vitamin B-12 (CYANOCOBALAMIN) 1000 MCG tablet Take 1 tablet (1,000 mcg total) by mouth daily. 90 tablet 1   fluticasone (FLONASE) 50 MCG/ACT nasal spray Place 1-2 sprays into both nostrils daily.     No current facility-administered medications for this visit.     Review of Systems Full ROS  was asked and was negative except for the information on the HPI  Physical Exam Blood pressure 137/81, pulse 67, temperature 98.4 F (36.9 C), temperature source Oral, height 5\' 4"  (1.626 m), weight 253 lb 9.6 oz (115 kg), SpO2 98 %. CONSTITUTIONAL: NAD EYES: Pupils are equal, round, Sclera are non-icteric. EARS, NOSE, MOUTH AND THROAT: The oropharynx is clear. The oral mucosa is pink and moist. Hearing is intact to voice. LYMPH NODES:  Lymph nodes in the neck are normal. BREAST: Evidence of a 1.5 cm hard mass retroareolar located at 2:00 it is mobile but is hard and seems to be in close proximity to the skin.  No evidence of any additional masses.  There are some biopsy changes in the left breast.  Right breast is completely normal.  There is no evidence of axillary i lymphadenopathy RESPIRATORY:  Lungs are clear. There is normal respiratory effort, with equal breath sounds  bilaterally, and without pathologic use of accessory muscles. CARDIOVASCULAR: Heart is regular without murmurs, gallops, or rubs. GI: The abdomen is  soft, nontender, and nondistended. There are no palpable masses. There is no hepatosplenomegaly. There are normal bowel sounds in all quadrants. GU: Rectal deferred.   MUSCULOSKELETAL: Normal muscle strength and tone. No cyanosis or edema.   SKIN: Turgor is good and there are no pathologic skin lesions or ulcers. NEUROLOGIC: Motor and sensation is grossly normal. Cranial nerves are grossly intact. PSYCH:  Oriented to person, place and time. Affect is normal.  Data Reviewed  I have personally reviewed the patient's imaging, laboratory findings and medical records.    Assessment/Plan 52 year old female with recently diagnosed multifocal mammary carcinoma on the left breast facecloth.  The nipple areolar complex.  Discussed with the patient in detail about her disease process.  I do think that we need an MRI to delineate the extent of the left breast multifocal carcinoma.  I do also encouraged her and asked to wait for genetic counseling.  Discussed with her and her friend in detail about surgical options lumpectomy with sentinel lymph node biopsy versus mastectomy and sentinel lymph node biopsy.  Options of reconstructions were also discussed with her in detail.  I think at this time it is too early to recommend any 1 definitive therapy over the other.  I Do think that we might not be able to do a nipple sparing on the left side given the proximity to the nipple areolar complex. She is leaning towards mastectomy with immediate reconstruction.  We will go ahead and involve plastic surgery as well. RTC next week Greater than 60 minutes in this encounter including personally reviewing imaging studies, coordinating her care, placing orders and performing appropriate documentation  Sterling Big, MD FACS General Surgeon 12/11/2022, 5:19 PM

## 2022-12-11 NOTE — Progress Notes (Signed)
[facility] Clinical Social Work  Initial Assessment   Vanessa Medina is a 52 y.o. year old female presenting alone. Clinical Social Work was referred by medical provider for assessment of psychosocial needs.   SDOH (Social Determinants of Health) assessments performed: Yes SDOH Interventions    Flowsheet Row Clinical Support from 12/11/2022 in [facility]  SDOH Interventions   Alcohol Usage Interventions Intervention Not Indicated (Score <7)  Financial Strain Interventions Financial Counselor  Physical Activity Interventions Intervention Not Indicated  Stress Interventions Provide Counseling       SDOH Screenings   Food Insecurity: Food Insecurity Present (12/08/2022)  Housing: Low Risk  (12/08/2022)  Transportation Needs: No Transportation Needs (12/08/2022)  Utilities: At Risk (12/08/2022)  Alcohol Screen: Low Risk  (12/11/2022)  Depression (PHQ2-9): Low Risk  (12/08/2022)  Financial Resource Strain: Medium Risk (12/11/2022)  Physical Activity: Inactive (12/11/2022)  Social Connections: Moderately Isolated (12/11/2022)  Stress: Stress Concern Present (12/11/2022)  Tobacco Use: Low Risk  (12/07/2022)     Distress Screen completed: No     No data to display            Family/Social Information:  Housing Arrangement: patient lives alone, main contact son, Andres Labrum [phone]  Family members/support persons in your life? Family, Friends, and Ambulance person concerns: no  Employment: Disabled pending long-tern disability approval.  Income source: Out of work due to illness Financial concerns: Yes, current concerns Type of concern: Chief of Staff access concerns: yes, financial food affordability concerns Religious or spiritual practice: Not known Services Currently in place:  [facility] Medicaid - Pending long-term disability approval  Coping/ Adjustment to diagnosis: Patient understands treatment plan and what happens  next? no, pending consultation with surgeon and treatment plan Concerns about diagnosis and/or treatment: Losing my job and/or losing income, Overwhelmed by information, How I will pay for the services I need, How will I care for myself, and Quality of life Patient reported stressors: Finances, Food, Adjusting to my illness, Isolation/ feeling alone, and Physical issues Hopes and/or priorities: long-term disability approval Patient enjoys reading and N/A Current coping skills/ strengths: Active sense of humor , Average or above average intelligence , Capable of independent living , Communication skills , Motivation for treatment/growth , Supportive family/friends , and Work skills     SUMMARY: Current SDOH Barriers:  Financial constraints related to reduction of income due to recent surgery and breast cancer diagnosis, patient pending long-term disability approval, Limited social support, Limited access to food, Level of care concerns, and ADL IADL limitations  Clinical Social Work Clinical Goal(s):  No clinical social work goals at this time  Interventions: Discussed common feeling and emotions when being diagnosed with cancer, and the importance of support during treatment Informed patient of the support team roles and support services at [facility] Provided CSW contact information and encouraged patient to call with any questions or concerns Referred patient to Audiological scientist and Provided patient with information about CSW role in patient care, grant fund information and food assistance information.  Patient is currently pending consultation with surgeon and treatment plan.    Follow Up Plan: Patient will contact CSW with any support or resource needs Patient verbalizes understanding of plan: Yes    Joseph Art, LCSW   Patient is participating in a Managed Medicaid Plan:  Yes

## 2022-12-11 NOTE — Patient Instructions (Addendum)
Your breast MRI is scheduled for 12/15/2022 9 am (arrive by 8:30 am) at [facility]. Nothing to eat or dink 4 hours prior.    If you have any concerns or questions, please feel free to call our office. See follow up appointment below.   Breast Cancer, Female  Breast cancer is a malignant growth of tissue (tumor) in the breast. Unlike noncancerous (benign) tumors, malignant tumors are cancerous and can spread to other parts of the body. The two most common types of breast cancer start in the milk ducts (ductal carcinoma) or in the lobules where milk is made in the breast (lobular carcinoma). Breast cancer is one of the most common types of cancer in women. What are the causes? The exact cause of female breast cancer is unknown. What increases the risk? The following factors may make you more likely to develop this condition: Being older than 53 years of age. Having a family history of breast cancer. Starting menopause after age 74. Starting your menstrual periods before age 62. Having never been pregnant or having your first child after age 33. Having never breastfed. A personal history of: Breast cancer. Dense breast tissue. Radiation exposure. Having the BRCA1 and BRCA2 genes. Having certain types of benign breast conditions. Exposure to the drug DES, which was given to pregnant women from the 1940s to the 1970s. Other risks include: Using birth control pills. Using hormone therapy after menopause. Drinking more than one alcoholic drink a day. Obesity. What are the signs or symptoms? Symptoms of this condition include: A painless lump or thickening in your breast. Changes in the size or shape of your breast. Breast skin changes, such as puckering or dimpling. Nipple abnormalities, such as scaling, crustiness, redness, or pulling in (retraction). Nipple discharge that is bloody or clear. How is this diagnosed? This condition may be diagnosed by: Taking your medical history  and doing a physical exam. During the exam, your health care provider will feel the tissue around your breast and under your arms. Taking a sample of nipple discharge. The sample will be examined under a microscope. Performing imaging tests, such as breast X-rays (mammogram), ultrasound, or MRI. Taking a tissue sample (biopsy) from the breast. The sample will be examined under a microscope to look for cancer cells. Taking a sample from the lymph nodes near the affected breast (sentinel node biopsy). Your cancer will be staged to determine its severity and extent. Staging is a careful attempt to find out the size of the tumor, whether the cancer has spread, and if so, to what parts of the body. Staging also includes testing your tumor for certain receptors, such as estrogen, progesterone, and human epidermal growth factor receptor 2 (HER2). This will help your cancer care team decide on a treatment that will work best for you. You may need to have more tests to determine the stage of your cancer. Stages include the following: Stage 0--The tumor has not spread to other breast tissue. Stage 1 (I)--The cancer is only found in the breast or may be in the lymph nodes. The tumor may be up to  inch (2 cm) wide. Stage 2 (II)--The cancer has spread to nearby lymph nodes. The tumor may be up to 2 inches (5 cm) wide. Stage 3 (III)--The cancer has spread to more distant lymph nodes. The tumor may be larger than 2 inches (5 cm) wide. Stage 4 (IV)--The cancer has spread to other parts of the body, such as the bones, brain, liver,  or lungs. How is this treated? Treatment for this condition depends on the type and stage of the breast cancer. It may be treated with: Surgery. This may involve breast-conserving surgery (lumpectomy or partial mastectomy) in which only the part of the breast containing the cancer is removed. Some normal tissue surrounding this area may also be removed. In some cases, surgery may be done to  remove the entire breast (mastectomy) and nipple. Lymph nodes may also be removed. Radiation therapy, which uses high-energy rays to kill cancer cells. Chemotherapy, which is the use of medicines to kill cancer cells. Hormone therapy, which involves taking medicine to adjust the hormone levels in your body. You may take medicine to decrease your estrogen levels. This can help stop cancer cells from growing. Targeted therapy, in which medicines are used to block the growth and spread of cancer cells. These medicines target a specific part of the cancer cell and usually cause fewer side effects than chemotherapy. Targeted therapy may be used alone or in combination with chemotherapy. Immunotherapy, which is the use of medicines to boost the immune system to recognize and destroy cancer cells more effectively. A combination of surgery, radiation, chemotherapy, or hormone therapy may be needed to treat breast cancer. Follow these instructions at home: Take over-the-counter and prescription medicines only as told by your health care provider. Eat a healthy diet. A healthy diet includes lots of fruits and vegetables, low-fat dairy products, lean meats, and fiber. Make sure half your plate is filled with fruits or vegetables. Choose high-fiber foods such as whole-grain breads and cereals. Consider joining a support group. This may help you cope with the stress of having breast cancer. Talk to your health care team about exercise and physical activity. The right exercise program can: Help prevent or reduce symptoms such as fatigue or depression. Improve overall health and survival rates. Keep all follow-up visits. This is important. Where to find more information American Cancer Society: www.cancer.org National Cancer Institute: www.cancer.gov Contact a health care provider if: You have a sudden increase in pain. You have any symptoms or changes that concern you. You lose weight without trying. You  notice a new lump in either breast or under your arm. You develop swelling in either arm or hand. You have a fever. You notice new fatigue or weakness. Get help right away if: You have chest pain or trouble breathing. These symptoms may be an emergency. Get help right away. Call 911. Do not wait to see if the symptoms will go away. Do not drive yourself to the hospital. Summary Breast cancer is a malignant growth of tissue (tumor) in the breast. Your cancer will be staged to determine its severity and extent. Treatment for this condition depends on the type and stage of the breast cancer. This information is not intended to replace advice given to you by your health care provider. Make sure you discuss any questions you have with your health care provider. Document Revised: 07/11/2021 Document Reviewed: 07/11/2021 Elsevier Patient Education  2023 [employer].

## 2022-12-14 ENCOUNTER — Telehealth: Payer: Self-pay | Admitting: Licensed Clinical Social Worker

## 2022-12-14 ENCOUNTER — Encounter: Payer: Self-pay | Admitting: Physical Medicine and Rehabilitation

## 2022-12-14 NOTE — Telephone Encounter (Signed)
I contacted Vanessa Medina to discuss her initial genetic testing results. No pathogenic variants were identified in the 9 genes analyzed. These genes have the highest risk for breast cancer and are often used to help with surgical decision making. The remainder of testing is pending and we will call Vanessa Medina again once the rest of the results are back.    The test report has been scanned into EPIC and is located under the Molecular Pathology section of the Results Review tab.  A portion of the result report is included below for reference.      Lacy Duverney, MS, [facility] Genetic Counselor [email or web address] Phone: [phone]

## 2022-12-15 ENCOUNTER — Ambulatory Visit
Admission: RE | Admit: 2022-12-15 | Discharge: 2022-12-15 | Disposition: A | Discharge status: Home / Self Care | Payer: Medicaid Other | Source: Ambulatory Visit | Attending: Surgery | Admitting: Surgery

## 2022-12-15 DIAGNOSIS — C50912 Malignant neoplasm of unspecified site of left female breast: Secondary | ICD-10-CM

## 2022-12-15 MED ORDER — GADOBUTROL 1 MMOL/ML IV SOLN
10.0000 mL | Freq: Once | INTRAVENOUS | Status: AC | PRN
  Administered 2022-12-15: 10 mL via INTRAVENOUS

## 2022-12-18 ENCOUNTER — Ambulatory Visit (INDEPENDENT_AMBULATORY_CARE_PROVIDER_SITE_OTHER): Payer: Medicaid Other | Admitting: Plastic Surgery

## 2022-12-18 ENCOUNTER — Encounter: Payer: Self-pay | Admitting: Plastic Surgery

## 2022-12-18 ENCOUNTER — Ambulatory Visit: Payer: Self-pay | Admitting: Licensed Clinical Social Worker

## 2022-12-18 ENCOUNTER — Telehealth: Payer: Self-pay | Admitting: Licensed Clinical Social Worker

## 2022-12-18 VITALS — BP 130/82 | HR 85 | Ht 64.0 in | Wt 255.4 lb

## 2022-12-18 DIAGNOSIS — Z170 Estrogen receptor positive status [ER+]: Secondary | ICD-10-CM | POA: Diagnosis not present

## 2022-12-18 DIAGNOSIS — C50812 Malignant neoplasm of overlapping sites of left female breast: Secondary | ICD-10-CM

## 2022-12-18 DIAGNOSIS — S34109S Unspecified injury to unspecified level of lumbar spinal cord, sequela: Secondary | ICD-10-CM

## 2022-12-18 DIAGNOSIS — Z1379 Encounter for other screening for genetic and chromosomal anomalies: Secondary | ICD-10-CM | POA: Insufficient documentation

## 2022-12-18 NOTE — Progress Notes (Signed)
HPI:   Ms. Mcdaniels was previously seen in the [facility] due to a personal and family history of cancer and concerns regarding a hereditary predisposition to cancer. Please refer to our prior cancer genetics clinic note for more information regarding our discussion, assessment and recommendations, at the time. Ms. Crisafi recent genetic test results were disclosed to her, as were recommendations warranted by these results. These results and recommendations are discussed in more detail below.  CANCER HISTORY:  Oncology History  Breast cancer  11/23/2022 Mammogram   Screening mammogram-   FINDINGS: In the left breast, possible masses warrant further evaluation. In the right breast, no findings suspicious for malignancy.  Diagnostic mammogram and [name] (11/29/2022) Mass 1:   At 1 o'clock 3 cm from the nipple, there is an irregular hypoechoic mass with irregular margins. There is associated sonographic architectural distortion. It measures approximately 8 x 7 by 8 mm.   Mass 2:   At 1:30 1 cm from the nipple, there is an oval mildly hypoechoic mass with irregular margins. It measures 9 x 7 by 9 mm.   Mass 3:   At 2 o'clock 1 cm from the nipple, there is an oval hypoechoic mass with mildly angular margins. It measures 6 by 8 x 4 mm.   There is approximately 2.1 cm between mass 1 and mass 3. Masses 1-3 span approximately 3.3 cm.   Targeted ultrasound was performed of the LEFT axilla. No suspicious axillary lymph nodes are visualized.   IMPRESSION: 1. There are 3 masses which extend along a ductal ray of the LEFT breast. One of these masses (mass 1) is a highly suspicious 8 mm mass at the site of screening mammographic concern at 1 o'clock 3 cm from the nipple with associated architectural distortion. The 2 other masses extend anteriorly from mass 1 towards the nipple and span in total 3.7 cm. Recommend 2 site ultrasound-guided biopsy for definitive  characterization with preferential sampling of mass 1.  2. No suspicious LEFT axillary adenopathy.   12/06/2022 Initial Biopsy   DIAGNOSIS: A. BREAST, LEFT AT 1:00, 3 CM FROM THE NIPPLE; ULTRASOUND-GUIDED CORE NEEDLE BIOPSY: - INVASIVE MAMMARY CARCINOMA, NO SPECIAL TYPE.  Size of invasive carcinoma: 12 mm in this sample Histologic grade of invasive carcinoma: Grade 1                      Glandular/tubular differentiation score: 2                      Nuclear pleomorphism score: 2                      Mitotic rate score: 1                      Total score: 5 Ductal carcinoma in situ: Present, intermediate grade Lymphovascular invasion: Not identified  LEFT AT 1:00, 3 CM FN;  HEART-SHAPED CLIP  Estrogen Receptor (ER) Status: POSITIVE          Percentage of cells with nuclear positivity: Greater than 90%          Average intensity of staining: Strong   Progesterone Receptor (PgR) Status: POSITIVE          Percentage of cells with nuclear positivity: Greater than 90%          Average intensity of staining: Strong   HER2 (by immunohistochemistry): NEGATIVE (Score 1+)  B. BREAST, LEFT AT 2:00, 1 CM FROM THE NIPPLE; ULTRASOUND-GUIDED CORE NEEDLE BIOPSY: - INVASIVE MAMMARY CARCINOMA, WITH MUCINOUS FEATURES.  Estrogen Receptor (ER) Status: POSITIVE          Percentage of cells with nuclear positivity: Greater than 90%          Average intensity of staining: Strong   Progesterone Receptor (PgR) Status: POSITIVE          Percentage of cells with nuclear positivity: Greater than 90%          Average intensity of staining: Strong   HER2 (by immunohistochemistry): NEGATIVE (Score 0)  Size of invasive carcinoma: 4 mm in this sample Histologic grade of invasive carcinoma: Grade 2                      Glandular/tubular differentiation score: 3                      Nuclear pleomorphism score: 2                      Mitotic rate score: 1                      Total score: 6 Ductal  carcinoma in situ: Not identified Lymphovascular invasion: Not identified     12/07/2022 Cancer Staging   Staging form: Breast, AJCC 8th Edition - Clinical stage from 12/06/2022: Stage IA (cT1b, cN0, cM0, G2, ER+, PR+, HER2-) - Signed by [name], MD on 12/08/2022 Stage prefix: Initial diagnosis Histologic grading system: 3 grade system      FAMILY HISTORY:  We obtained a detailed, 4-generation family history.  Significant diagnoses are listed below: Family History  Problem Relation Age of Onset   Renal Disease Mother    Hypertension Mother    Sudden Cardiac Death Mother    Heart failure Mother    Valvular heart disease Mother    Heart disease Mother    Breast cancer Maternal Grandmother    Stroke Brother    Heart attack Brother 9   Diabetes Other    Ms. Baccari has 1 son, 32. She has 1 brother, 99, no cancers.   Ms. Bultema mother died at 31. She has half siblings, patient has limited information about them. Maternal grandmother had breast cancer at unknown age, and four of her sisters also had breast cancer.   Ms. Garcilazo father currently had bladder and prostate cancer at 74 and is being treated for this at the [place]. Paternal uncle died of pancreatic cancer in his 47s. Grandmother had bladder cancer.    Ms. Beed is unaware of previous family history of genetic testing for hereditary cancer risks. There is no reported Ashkenazi Jewish ancestry. There is no known consanguinity.     GENETIC TEST RESULTS:  The [facility] Multi-Cancer+RNA Panel found no pathogenic mutations.   The Multi-Cancer + RNA Panel offered by [facility] includes sequencing and/or deletion/duplication analysis of the following 70 genes:  AIP*, ALK, APC*, ATM*, AXIN2*, BAP1*, BARD1*, BLM*, BMPR1A*, BRCA1*, BRCA2*, BRIP1*, CDC73*, CDH1*, CDK4, CDKN1B*, CDKN2A, CHEK2*, CTNNA1*, DICER1*, EPCAM, EGFR, FH*, FLCN*, GREM1, HOXB13, KIT, LZTR1, MAX*, MBD4, MEN1*, MET, MITF, MLH1*, MSH2*, MSH3*, MSH6*, MUTYH*, NF1*,  NF2*, NTHL1*, PALB2*, PDGFRA, PMS2*, POLD1*, POLE*, POT1*, PRKAR1A*, PTCH1*, PTEN*, RAD51C*, RAD51D*, RB1*, RET, SDHA*, SDHAF2*, SDHB*, SDHC*, SDHD*, SMAD4*, SMARCA4*, SMARCB1*, SMARCE1*, STK11*, SUFU*, TMEM127*, TP53*, TSC1*, TSC2*, VHL*. RNA analysis is performed for * genes.  The test report has been scanned into EPIC and is located under the Molecular Pathology section of the Results Review tab.  A portion of the result report is included below for reference. Genetic testing reported out on 12/15/2022.     Genetic testing identified a variant of uncertain significance (VUS) in the POLD1 gene called [id number].  At this time, it is unknown if this variant is associated with an increased risk for cancer or if it is benign, but most uncertain variants are reclassified to benign. It should not be used to make medical management decisions. With time, we suspect the laboratory will determine the significance of this variant, if any. If the laboratory reclassifies this variant, we will attempt to contact Ms. Grave to discuss it further.   Even though a pathogenic variant was not identified, possible explanations for the cancer in the family may include: There may be no hereditary risk for cancer in the family. The cancers in Ms. Raffield and/or her family may be sporadic/familial or due to other genetic and environmental factors. There may be a gene mutation in one of these genes that current testing methods cannot detect but that chance is small. There could be another gene that has not yet been discovered, or that we have not yet tested, that is responsible for the cancer diagnoses in the family.  It is also possible there is a hereditary cause for the cancer in the family that Ms. Stelzner did not inherit. Therefore, it is important to remain in touch with cancer genetics in the future so that we can continue to offer Ms. Kalmbach the most up to date genetic testing.    ADDITIONAL GENETIC TESTING:  We  discussed with Ms. Menning that her genetic testing was fairly extensive.  If there are additional relevant genes identified to increase cancer risk that can be analyzed in the future, we would be happy to discuss and coordinate this testing at that time.    CANCER SCREENING RECOMMENDATIONS:  Ms. Aguallo test result is considered negative (normal).  This means that we have not identified a hereditary cause for her personal and family history of cancer at this time.   An individual's cancer risk and medical management are not determined by genetic test results alone. Overall cancer risk assessment incorporates additional factors, including personal medical history, family history, and any available genetic information that may result in a personalized plan for cancer prevention and surveillance. Therefore, it is recommended she continue to follow the cancer management and screening guidelines provided by her oncology and primary healthcare provider.  RECOMMENDATIONS FOR FAMILY MEMBERS:   Since she did not inherit a identifiable mutation in a cancer predisposition gene included on this panel, her children could not have inherited a known mutation from her in one of these genes. Individuals in this family might be at some increased risk of developing cancer, over the general population risk, due to the family history of cancer.  Individuals in the family should notify their providers of the family history of cancer. We recommend women in this family have a yearly mammogram beginning at age 3, or 8 years younger than the earliest onset of cancer, an annual clinical breast exam, and perform monthly breast self-exams.  Family members should have colonoscopies by at age 20, or earlier, as recommended by their providers. Other members of the family may still carry a pathogenic variant in one of these genes that Ms. Schnepf did not inherit. Based on the family history, we  recommend her maternal relatives  (especially those who have had breast cancer), and those closely related to her paternal uncle who had pancreatic cancer, have genetic counseling and testing. Ms. Pallas will let us know if we can be of any assistance in coordinating genetic counseling and/or testing for this family member.   We do not recommend familial testing for the POLD1 variant of uncertain significance (VUS).  FOLLOW-UP:  Lastly, we discussed with Ms. Tingler that cancer genetics is a rapidly advancing field and it is possible that new genetic tests will be appropriate for her and/or her family members in the future. We encouraged her to remain in contact with cancer genetics on an annual basis so we can update her personal and family histories and let her know of advances in cancer genetics that may benefit this family.   Our contact number was provided. Ms. Gallaher questions were answered to her satisfaction, and she knows she is welcome to call us at anytime with additional questions or concerns.    Lacy Duverney, MS, [facility] Genetic Counselor [email or web address] Phone: [phone]

## 2022-12-18 NOTE — Progress Notes (Signed)
Called to speak with Daris about the Lilian Kapur, had to leave her a message asking that she return my call.

## 2022-12-18 NOTE — Telephone Encounter (Signed)
I contacted Ms. Spillman to discuss her genetic testing results. No pathogenic variants were identified in the 70 genes analyzed. Detailed clinic note to follow.   The test report has been scanned into EPIC and is located under the Molecular Pathology section of the Results Review tab.  A portion of the result report is included below for reference.      Lacy Duverney, MS, [facility] Genetic Counselor [email or web address] Phone: [phone]

## 2022-12-18 NOTE — Progress Notes (Addendum)
Patient ID: Vanessa Medina, female    DOB: 31-Aug-1971, 52 y.o.   MRN: [id number]   Chief Complaint  Patient presents with   Advice Only   Breast Problem   Breast Cancer    The patient is a 52 year old female here for breast reconstruction consultation.  She went for a routine mammogram and was told she had left-sided breast cancer.  I do not have access to the full records yet.  She is 5 feet 4 inches tall and weighs 255 pounds She is not a smoker and does not have diabetes.  She is probably a B/C cup and would like to be around the same size.  She has grade 3 breast ptosis.  She has decided on a left-sided mastectomy.  She has had multiple spine surgeries with the most recent one being 4 months ago in the lumbar region.  She also had gastric sleeve several years ago.  She also had biliopancreatic diversion, appendectomy, hysterectomy, cholecystectomy and a hiatal hernia repair.    Review of Systems  Constitutional: Negative.   HENT: Negative.    Eyes: Negative.   Respiratory: Negative.  Negative for chest tightness and shortness of breath.   Cardiovascular: Negative.  Negative for leg swelling.  Gastrointestinal: Negative.   Endocrine: Negative.   Genitourinary: Negative.   Musculoskeletal: Negative.   Skin: Negative.     Past Medical History:  Diagnosis Date   Anemia    Cervical cancer    Family history of adverse reaction to anesthesia     " my mother takes a long time time wake up"   GERD (gastroesophageal reflux disease)    Headache    migraine   History of dysplastic nevus 01/18/2018   right shoulder, recurrent dysplastic nevus   History of shingles    Hypertension    Migraine    Multiple allergies    Obesity    Osteoarthritis    left hip   PONV (postoperative nausea and vomiting)    Sleep apnea    does not wear CPAP   Stroke 08/11/2020   Wears glasses     Past Surgical History:  Procedure Laterality Date   ABDOMINAL HYSTERECTOMY     APPENDECTOMY      BACK SURGERY     BILIOPANCREATIC DIVERSION     with duodenal switch laparoscopic    BREAST BIOPSY Left 12/06/2022   u/s bx,1:00 heart clip, path pending   BREAST BIOPSY Left 12/06/2022   [name] bx 2:00 ribbon clip path pending   BREAST BIOPSY Left 12/06/2022   [name] LT BREAST BX W LOC DEV EA ADD LESION IMG BX SPEC US GUIDE 12/06/2022 [facility]   BREAST BIOPSY Left 12/06/2022   [name] LT BREAST BX W LOC DEV 1ST LESION IMG BX SPEC US GUIDE 12/06/2022 [facility]   CHOLECYSTECTOMY     COLONOSCOPY N/A 06/27/2021   Procedure: COLONOSCOPY;  Surgeon: Jaynie Collins, DO;  Location: [facility] ENDOSCOPY;  Service: Gastroenterology;  Laterality: N/A;   DIAGNOSTIC LAPAROSCOPY     LOA   ESOPHAGOGASTRODUODENOSCOPY N/A 06/27/2021   Procedure: ESOPHAGOGASTRODUODENOSCOPY (EGD);  Surgeon: Jaynie Collins, DO;  Location[facility] ENDOSCOPY;  Service: Gastroenterology;  Laterality: N/A;   ESOPHAGOGASTRODUODENOSCOPY (EGD) WITH PROPOFOL N/A 07/25/2017   Procedure: ESOPHAGOGASTRODUODENOSCOPY (EGD) WITH PROPOFOL;  Surgeon: Scot Jun, MD;  Location: [facility] ENDOSCOPY;  Service: Endoscopy;  Laterality: N/A;   KNEE ARTHROSCOPY     LAPAROSCOPIC GASTRIC SLEEVE RESECTION WITH HIATAL HERNIA REPAIR     LUMBAR  FUSION  08/29/2022   Revision Lumbar two through five Laminectomy & Fusion with Transforaminal Lumbar interbody fusion   TONSILLECTOMY     TOTAL HIP ARTHROPLASTY Left 01/26/2016   Procedure: LEFT TOTAL HIP ARTHROPLASTY ANTERIOR APPROACH;  Surgeon: Tarry Kos, MD;  Location: [facility] OR;  Service: Orthopedics;  Laterality: Left;   TRANSFORAMINAL LUMBAR INTERBODY FUSION (TLIF) WITH PEDICLE SCREW FIXATION 3 LEVEL  08/2019   Vira Browns, MD L2-L5      Current Outpatient Medications:    aspirin EC 81 MG tablet, Take 81 mg by mouth daily. Swallow whole., Disp: , Rfl:    azelastine (ASTELIN) 0.1 % nasal spray, Place into the nose., Disp: , Rfl:    baclofen (LIORESAL) 10 MG tablet, Take 1 tablet (10 mg total) by  mouth 3 (three) times daily. - for spasms, Disp: 270 tablet, Rfl: 3   diclofenac Sodium (VOLTAREN) 1 % GEL, APPLY 4 GRAMS TO AFFECTED  AREA(S) TOPICALLY 4 TIMES DAILY, Disp: 1500 g, Rfl: 1   DULoxetine (CYMBALTA) 60 MG capsule, TAKE 1 CAPSULE BY MOUTH AT  NIGHT for nerve pain, Disp: 90 capsule, Rfl: 3   EPINEPHrine 0.3 mg/0.3 mL IJ SOAJ injection, SMARTSIG:0.3 Milliliter(s) IM Once PRN, Disp: , Rfl:    fludrocortisone (FLORINEF) 0.1 MG tablet, Take 2 tablets (0.2 mg total) by mouth daily. For orthostatic hypotension-, Disp: 180 tablet, Rfl: 3   gabapentin (NEURONTIN) 300 MG capsule, Take 1 capsule (300 mg total) by mouth at bedtime. For nerve pain, Disp: 90 capsule, Rfl: 3   midodrine (PROAMATINE) 10 MG tablet, Take 1 tablet (10 mg total) by mouth 3 (three) times daily. For orthostatic hypotension, Disp: 270 tablet, Rfl: 3   montelukast (SINGULAIR) 10 MG tablet, TAKE 1 TABLET BY MOUTH AT  BEDTIME FOR ALLERGIES, Disp: 90 tablet, Rfl: 3   Multiple Vitamin (MULTIVITAMIN WITH MINERALS) TABS tablet, Take 1 tablet by mouth 2 (two) times daily., Disp: , Rfl:    MYRBETRIQ 25 MG TB24 tablet, Take 25 mg by mouth daily., Disp: , Rfl:    pramipexole (MIRAPEX) 0.125 MG tablet, Take by mouth., Disp: , Rfl:    pyridOXINE (VITAMIN B-6) 100 MG tablet, Take 1 tablet (100 mg total) by mouth daily., Disp: 90 tablet, Rfl: 1   Thiamine HCl (VITAMIN B-1) 250 MG tablet, Take 1 tablet (250 mg total) by mouth daily., Disp: 90 tablet, Rfl: 1   topiramate (TOPAMAX) 100 MG tablet, TAKE 1 TABLET BY MOUTH AT  BEDTIME, Disp: 90 tablet, Rfl: 3   vitamin B-12 (CYANOCOBALAMIN) 1000 MCG tablet, Take 1 tablet (1,000 mcg total) by mouth daily., Disp: 90 tablet, Rfl: 1   fluticasone (FLONASE) 50 MCG/ACT nasal spray, Place 1-2 sprays into both nostrils daily., Disp: , Rfl:    Objective:   Vitals:   12/18/22 1346  BP: 130/82  Pulse: 85  SpO2: 97%    Physical Exam Vitals and nursing note reviewed.  Constitutional:       Appearance: Normal appearance.  HENT:     Head: Normocephalic and atraumatic.  Cardiovascular:     Rate and Rhythm: Normal rate.     Pulses: Normal pulses.  Pulmonary:     Effort: Pulmonary effort is normal.  Abdominal:     Palpations: Abdomen is soft.  Skin:    General: Skin is warm.     Capillary Refill: Capillary refill takes less than 2 seconds.     Coloration: Skin is not jaundiced.     Findings: No bruising.  Neurological:  Mental Status: She is alert and oriented to person, place, and time.  Psychiatric:        Mood and Affect: Mood normal.        Behavior: Behavior normal.        Thought Content: Thought content normal.        Judgment: Judgment normal.     Assessment & Plan:  Malignant neoplasm of overlapping sites of left breast in female, estrogen receptor positive  Spinal cord injury, lumbar, without spinal bone injury, sequela  The options for reconstruction we explained to the patient / family for breast reconstruction.  There are two general categories of reconstruction.  We can reconstruction a breast with implants or use the patient's own tissue.  These were further discussed as listed.  Breast reconstruction is an optional procedure and eligibility depends on the full spectrum of the health of the patient and any co-morbidities.  More than one surgery is often needed to complete the reconstruction process.  The process can take three to twelve months to complete.  The breasts will not be identical due to many factors such as rib differences, shoulder asymmetry and treatments such as radiation.  The goal is to get the breasts to look normal and symmetrical in clothes.  Scars are a part of surgery and may fade some in time but will always be present under clothes.  Surgery may be an option on the non-cancer breast to achieve more symmetry.  No matter which procedure is chosen there is always the risk of complications and even failure of the body to heal.  This could  result in no breast.    The options for reconstruction include:  1. Placement of a tissue expander with Acellular dermal matrix. When the expander is the desired size surgery is performed to remove the expander and place an implant.  In some cases the implant can be placed without an expander.  2. Autologous reconstruction can include using a muscle or tissue from another area of the body to create a breast.  3. Combined procedures (ie. latissismus dorsi flap) can be done with an expander / implant placed under the muscle.   The risks, benefits, scars and recovery time were discussed for each of the above. Risks include bleeding, infection, hematoma, seroma, scarring, pain, wound healing complications, flap loss, fat necrosis, capsular contracture, need for implant removal, donor site complications, bulge, hernia, umbilical necrosis, need for urgent reoperation, and need for dressing changes.   The procedure the patient selected / that was best for the patient, was then discussed in further detail.  Total time: 45 minutes. This includes time spent with the patient during the visit as well as time spent before and after the visit reviewing the chart, documenting the encounter, making phone calls and reviewing studies.   With the multiple spine surgeries I do not think that latissimus or TRAM is a good idea.  I would not want to decrease her core strength in any way.  Patient is in agreement.  She would like to have immediate left breast reconstruction with expander and Flex HD placement.  I have spoken with Dr. Elenor Legato and made him aware of the patient's decision.  Pictures were obtained of the patient and placed in the chart with the patient's or guardian's permission.   Alena Bills Braylyn Eye, DO

## 2022-12-20 ENCOUNTER — Ambulatory Visit
Admission: RE | Admit: 2022-12-20 | Discharge: 2022-12-20 | Disposition: A | Discharge status: Home / Self Care | Payer: Medicaid Other | Source: Ambulatory Visit | Attending: Internal Medicine | Admitting: Internal Medicine

## 2022-12-20 ENCOUNTER — Encounter: Payer: Self-pay | Admitting: Surgery

## 2022-12-20 ENCOUNTER — Ambulatory Visit (INDEPENDENT_AMBULATORY_CARE_PROVIDER_SITE_OTHER): Payer: Medicaid Other | Admitting: Surgery

## 2022-12-20 VITALS — BP 137/79 | HR 74 | Temp 98.0°F | Ht 64.0 in | Wt 256.0 lb

## 2022-12-20 DIAGNOSIS — Z0271 Encounter for disability determination: Secondary | ICD-10-CM

## 2022-12-20 DIAGNOSIS — C50412 Malignant neoplasm of upper-outer quadrant of left female breast: Secondary | ICD-10-CM | POA: Diagnosis not present

## 2022-12-20 DIAGNOSIS — C50812 Malignant neoplasm of overlapping sites of left female breast: Secondary | ICD-10-CM | POA: Diagnosis present

## 2022-12-20 DIAGNOSIS — Z170 Estrogen receptor positive status [ER+]: Secondary | ICD-10-CM | POA: Insufficient documentation

## 2022-12-20 MED ORDER — TECHNETIUM TC 99M MEDRONATE IV KIT
20.0000 | PACK | Freq: Once | INTRAVENOUS | Status: AC | PRN
  Administered 2022-12-20: 21.77 via INTRAVENOUS

## 2022-12-20 NOTE — Patient Instructions (Addendum)
We have spoken today about breast surgery. Your Mastectomy will be scheduled at [facility] with Dr. Everlene Farrier.  You will spend at least 1 night in the hospital following surgery and go home with 2-4 drains for approximately 5-15 days following your surgery. Please keep an accurate record of your drain amount in ml's or cc's. If your drain suddenly stops draining or has drainage around the tube at the skin, call our office and speak with a nurse immediately.  Please see the (blue) pre-care surgery sheet that you have been given today for more information regarding your surgery. Our surgery scheduler will call you to look at surgery dates and to go over information about your surgery.   Information regarding your surgery has been provided below. If you have any questions or concerns, please call our office and speak with a nurse.  Sentinel Lymph Node Biopsy in Breast Cancer Treatment Sentinel lymph node biopsy is a procedure to identify, remove, and examine one or more lymph nodes for cancer. Lymph is fluid from the tissues in the body. It is removed through the lymphatic system. This system is part of the body's defense system (immune system) and includes lymph nodes and lymph vessels. Cancer can spread to nearby lymph nodes. It usually spreads to one lymph node first, and then to others. The first lymph node that the cancer could spread to is called the sentinel lymph node. In some cases, there may be more than one sentinel lymph node. If you have breast cancer, you may have this procedure to determine whether your cancer has spread. For breast cancer, a sentinel lymph node is usually in the armpit because that is where breast cancer tends to spread first. If no cancer is found in a sentinel lymph node, it is very unlikely that the cancer has spread to any of the other lymph nodes. If cancer is found in a sentinel lymph node, your surgeon may remove additional lymph nodes for examination.   Total or Modified  Radical Mastectomy A total mastectomy and a modified radical mastectomy are types of surgery for breast cancer. If you are having a total mastectomy (simple mastectomy), your entire breast will be removed. If you are having a modified radical mastectomy, your breast and nipple will be removed along with the lymph nodes under your arm. You may also have some of the lining over the muscle tissues under your breast removed. LET [facility] CARE PROVIDER KNOW ABOUT: Any allergies you have. All medicines you are taking, including vitamins, herbs, eye drops, creams, and over-the-counter medicines. Previous problems you or members of your family have had with the use of anesthetics. Any blood disorders you have. Previous surgeries you have had. Medical conditions you have. RISKS AND COMPLICATIONS Generally, this is a safe procedure. However, problems may occur, including: Pain. Infection. Bleeding. Scar tissue. Chest numbness on the side of the surgery. Fluid buildup under the skin flaps where your breast was removed (seroma). Sensation of throbbing or tingling. Stress or sadness from losing your breast. If you have the lymph nodes under your arm removed, you may have arm swelling, weakness, or numbness on the same side of your body as your surgery. BEFORE THE PROCEDURE Ask your health care provider about: Changing or stopping your regular medicines. This is especially important if you are taking diabetes medicines or blood thinners. Taking medicines such as aspirin and ibuprofen. These medicines can thin your blood. Do not take these medicines before your procedure if your health care  provider instructs you not to. Follow your health care provider's instructions about eating or drinking restrictions. Plan to have someone take you home after the procedure. PROCEDURE An IV tube will be inserted into one of your veins. You will be given a medicine that makes you fall asleep (general  anesthetic). Your breast will be cleaned with a germ-killing solution (antiseptic). A wide incision will be made around your nipple. The skin and nipple inside the incision will be removed along with all breast tissue. If you are having a modified radical mastectomy: The lining over your chest muscles will be removed. The incision may be extended to reach the lymph nodes under your arm, or a second incision may be made. The lymph nodes will be removed. You may have a drainage tube inserted into your incision to collect fluid that builds up after surgery. This tube is connected to a suction bulb. Your incision or incisions will be closed with stitches (sutures). A bandage (dressing) will be placed over your breast and under your arm. The procedure may vary among health care providers and hospitals. AFTER THE PROCEDURE You will be moved to a recovery area. Your blood pressure, heart rate, breathing rate, and blood oxygen level will be monitored often until the medicines you were given have worn off. You will be given pain medicine as needed. After a while, you will be taken to a hospital room. You will be encouraged to get up and walk as soon as you can. Your IV tube can be removed when you are able to eat and drink. Your drain may be removed before you go home from the hospital, or you may be sent home with your drain and suction bulb.   This information is not intended to replace advice given to you by your health care provider. Make sure you discuss any questions you have with your health care provider.   Document Released: 05/16/2001 Document Revised: 09/11/2014 Document Reviewed: 05/06/2014 Elsevier Interactive Patient Education [employer]

## 2022-12-21 NOTE — Progress Notes (Signed)
Surgical Consultation  12/21/2022  Vanessa Medina is an 52 y.o. female.   Chief Complaint  Patient presents with   Follow-up     HPI: Vanessa Medina is a 52 y.o. female with a recently diagnosed mammary carcinoma ER/PR positive HER2 negative Menarche at age 64, grandmother on her 4 sister with history of breast cancer.  Dad with history of bladder cancer.  Prior breast biopsies abnormal mammogram in 2022. Hysterectomy in 2009. G1P1. SHe Did have a recent mammogram that I personally reviewed showing evidence of a left upper outer quadrant mass.  This was followed by an ultrasound showing 3 masses that are very anterior and close to the skin from 1:00 all the way to the 3:00.  The span of 3.7 cm. Serous of masses 1 and 2 showed mammary carcinoma ER/PR positive HER2 negative. SHe had her second lumbar fusion with cage about 3 months ago and is recovering.  She denies any breast concerns no masses no breast discharge she only saw some skin dryness on the left side.  He has now healed from the recent breast biopsy. He underwent genetic testing and was no found to have an identified abnormality.  She also had an MRI that have personally reviewed.  There are 3 masses on the left side and one that is retroareolar and close the skin.  The 3 masses encompassing the area of 4 cm..  On the right side there is no evidence of abnormalities. She has seen plastic surgery Dr. Ulice Bold and she wishes to pursue reconstruction  Past Medical History:  Diagnosis Date   Anemia    Cervical cancer    Family history of adverse reaction to anesthesia     " my mother takes a long time time wake up"   GERD (gastroesophageal reflux disease)    Headache    migraine   History of dysplastic nevus 01/18/2018   right shoulder, recurrent dysplastic nevus   History of shingles    Hypertension    Migraine    Multiple allergies    Obesity    Osteoarthritis    left hip   PONV (postoperative nausea and  vomiting)    Sleep apnea    does not wear CPAP   Stroke 08/11/2020   Wears glasses     Past Surgical History:  Procedure Laterality Date   ABDOMINAL HYSTERECTOMY     APPENDECTOMY     BACK SURGERY     BILIOPANCREATIC DIVERSION     with duodenal switch laparoscopic    BREAST BIOPSY Left 12/06/2022   u/s bx,1:00 heart clip, path pending   BREAST BIOPSY Left 12/06/2022   [name] bx 2:00 ribbon clip path pending   BREAST BIOPSY Left 12/06/2022   [name] LT BREAST BX W LOC DEV EA ADD LESION IMG BX SPEC US GUIDE 12/06/2022 [facility]   BREAST BIOPSY Left 12/06/2022   [name] LT BREAST BX W LOC DEV 1ST LESION IMG BX SPEC US GUIDE 12/06/2022 [facility]   CHOLECYSTECTOMY     COLONOSCOPY N/A 06/27/2021   Procedure: COLONOSCOPY;  Surgeon: Jaynie Collins, DO;  Location: [facility] ENDOSCOPY;  Service: Gastroenterology;  Laterality: N/A;   DIAGNOSTIC LAPAROSCOPY     LOA   ESOPHAGOGASTRODUODENOSCOPY N/A 06/27/2021   Procedure: ESOPHAGOGASTRODUODENOSCOPY (EGD);  Surgeon: Jaynie Collins, DO;  Location: [facility] ENDOSCOPY;  Service: Gastroenterology;  Laterality: N/A;   ESOPHAGOGASTRODUODENOSCOPY (EGD) WITH PROPOFOL N/A 07/25/2017   Procedure: ESOPHAGOGASTRODUODENOSCOPY (EGD) WITH PROPOFOL;  Surgeon: Scot Jun, MD;  Location: [facility]  ENDOSCOPY;  Service: Endoscopy;  Laterality: N/A;   KNEE ARTHROSCOPY     LAPAROSCOPIC GASTRIC SLEEVE RESECTION WITH HIATAL HERNIA REPAIR     LUMBAR FUSION  08/29/2022   Revision Lumbar two through five Laminectomy & Fusion with Transforaminal Lumbar interbody fusion   TONSILLECTOMY     TOTAL HIP ARTHROPLASTY Left 01/26/2016   Procedure: LEFT TOTAL HIP ARTHROPLASTY ANTERIOR APPROACH;  Surgeon: Tarry Kos, MD;  Location: [facility] OR;  Service: Orthopedics;  Laterality: Left;   TRANSFORAMINAL LUMBAR INTERBODY FUSION (TLIF) WITH PEDICLE SCREW FIXATION 3 LEVEL  08/2019   Vira Browns, MD L2-L5    Family History  Problem Relation Age of Onset   Renal Disease Mother     Hypertension Mother    Sudden Cardiac Death Mother    Heart failure Mother    Valvular heart disease Mother    Heart disease Mother    Breast cancer Maternal Grandmother    Stroke Brother    Heart attack Brother 51   Diabetes Other     Social History:  reports that she has never smoked. She has never been exposed to tobacco smoke. She has never used smokeless tobacco. She reports current alcohol use. She reports that she does not use drugs.  Allergies:  Allergies  Allergen Reactions   Bee Venom Swelling   Latex Hives and Rash   Meperidine Hcl Other (See Comments)   Morphine Other (See Comments)    BRADYCARDIA   Shellfish Allergy Anaphylaxis   Codeine Nausea And Vomiting   Meperidine Hcl Other (See Comments)    BRADYCARDIA   Oxycodone-Acetaminophen Hives   Acetaminophen Itching   Bee Pollen Other (See Comments)    Unknown   Iodinated Contrast Media Other (See Comments)    Swelling  Other reaction(s): Other (See Comments)  Swelling   Swelling   Oxycodone Hives and Itching    Blisters on back   Pentazocine Nausea Only and Nausea And Vomiting   Oxycodone-Acetaminophen Itching   Pentazocine Lactate Nausea And Vomiting    REACTION: vomiting with Talwin NX   Propoxyphene Nausea Only and Nausea And Vomiting   Propoxyphene N-Acetaminophen Nausea And Vomiting    Medications reviewed.     ROS Full ROS performed and is otherwise negative other than what is stated in the HPI    BP 137/79   Pulse 74   Temp 98 F (36.7 C)   Ht  (1.626 m)   Wt 256 lb (116.1 kg)   SpO2 98%   BMI 43.94 kg/m   Physical Exam CONSTITUTIONAL: NAD EYES: Pupils are equal, round, Sclera are non-icteric. EARS, NOSE, MOUTH AND THROAT: The oropharynx is clear. The oral mucosa is pink and moist. Hearing is intact to voice. LYMPH NODES:  Lymph nodes in the neck are normal.RESPIRATORY:  Lungs are clear. There is normal respiratory effort, with equal breath sounds bilaterally, and without  pathologic use of accessory muscles. CARDIOVASCULAR: Heart is regular without murmurs, gallops, or rubs. GI: The abdomen is  soft, nontender, and nondistended. There are no palpable masses. There is no hepatosplenomegaly. There are normal bowel sounds in all quadrants. GU: Rectal deferred.   MUSCULOSKELETAL: Normal muscle strength and tone. No cyanosis or edema.   SKIN: Turgor is good and there are no pathologic skin lesions or ulcers. NEUROLOGIC: Motor and sensation is grossly normal. Cranial nerves are grossly intact. PSYCH:  Oriented to person, place and time. Affect is normal.   Assessment/Plan: 1. Malignant neoplasm of upper-outer quadrant of left  breast in female, estrogen receptor positive. I had another extensive discussion with the patient regarding her disease process.  After careful consideration she wishes to pursue mastectomy and actually she is concerned about the contralateral side and wishes to proceed with bilateral mastectomies. She Does not want to have to worry about the contralateral right breast and she is very worried about potential cancers developing on the right side I was also very candid that on the left side I will be very hesitant to do a nipple sparing mastectomy as the mass is very close to the skin and the nipple areolar complex and we may have positive margins if we do a nipple sparing.  Patient does not want to take any risk regarding margin positivity.  She wishes to go ahead and do a skin sparing mastectomy.  I do think that we will need to also perform a sentinel lymph node biopsy on the left side but not on the right. Procedure discussed with the patient in detail.  Risks, benefits and possible complications including but not limited to: Bleeding, infection, margin positivity, need for radiation, chronic pain, lymphedema.  She understands and wished to proceed. I spent Greater than 40 minutes in this encounter including personally reviewing imaging studies,  coordinating her care, placing orders and performing appropriate documentation     Sterling Big, MD [facility] General Surgeon

## 2022-12-22 ENCOUNTER — Ambulatory Visit
Admission: RE | Admit: 2022-12-22 | Discharge: 2022-12-22 | Disposition: A | Discharge status: Home / Self Care | Payer: Medicaid Other | Source: Ambulatory Visit | Attending: Internal Medicine | Admitting: Internal Medicine

## 2022-12-22 DIAGNOSIS — C50812 Malignant neoplasm of overlapping sites of left female breast: Secondary | ICD-10-CM | POA: Diagnosis present

## 2022-12-22 DIAGNOSIS — Z170 Estrogen receptor positive status [ER+]: Secondary | ICD-10-CM | POA: Insufficient documentation

## 2022-12-25 ENCOUNTER — Telehealth: Payer: Self-pay | Admitting: Surgery

## 2022-12-25 ENCOUNTER — Other Ambulatory Visit: Payer: Self-pay | Admitting: Surgery

## 2022-12-25 DIAGNOSIS — Z853 Personal history of malignant neoplasm of breast: Secondary | ICD-10-CM

## 2022-12-25 NOTE — Telephone Encounter (Signed)
Patient has been advised of Pre-Admission date/time, and Surgery date at [facility].  Surgery Date: 01/18/23 patient to arrive @ 7:30 am for SLN bx injection to be done first prior to her surgery. Patient is aware that this will be a combined case with Dr. Ulice Bold with plastics.    Preadmission Testing Date: 01/10/23 (phone 8a-1p)  All questions and concerns are addressed with the patient. Patient is aware of where to go for her surgery and will be an overnight stay.

## 2022-12-26 ENCOUNTER — Encounter: Payer: Self-pay | Admitting: *Deleted

## 2022-12-26 NOTE — Progress Notes (Signed)
Mastectomy is scheduled for 5/16, she will see Dr. Mervyn Skeeters back on 02/08/23.  Appt details given to her.

## 2022-12-31 ENCOUNTER — Encounter: Payer: Self-pay | Admitting: Physical Medicine and Rehabilitation

## 2023-01-03 ENCOUNTER — Telehealth: Payer: Self-pay | Admitting: Physical Medicine and Rehabilitation

## 2023-01-03 ENCOUNTER — Ambulatory Visit (INDEPENDENT_AMBULATORY_CARE_PROVIDER_SITE_OTHER): Payer: Medicaid Other | Admitting: Physician Assistant

## 2023-01-03 VITALS — BP 132/73 | HR 71

## 2023-01-03 DIAGNOSIS — C50812 Malignant neoplasm of overlapping sites of left female breast: Secondary | ICD-10-CM

## 2023-01-03 DIAGNOSIS — Z170 Estrogen receptor positive status [ER+]: Secondary | ICD-10-CM

## 2023-01-03 MED ORDER — CEPHALEXIN 500 MG PO CAPS: 500.0000 mg | ORAL_CAPSULE | Freq: Four times a day (QID) | ORAL | 0 refills | Status: DC

## 2023-01-03 MED ORDER — HYDROCODONE-ACETAMINOPHEN 5-325 MG PO TABS: 1.0000 | ORAL_TABLET | Freq: Four times a day (QID) | ORAL | 0 refills | Status: DC | PRN

## 2023-01-03 NOTE — Telephone Encounter (Signed)
error 

## 2023-01-03 NOTE — Progress Notes (Signed)
Patient ID: Vanessa Medina, female    DOB: 11/27/1970, 52 y.o.   MRN: [id number]  Chief Complaint  Patient presents with   Pre-op Exam    No diagnosis found.   History of Present Illness: Vanessa Medina is a 52 y.o.  female  with a history of left-sided breast cancer.  She presents for preoperative evaluation for upcoming procedure, bilateral immediate breast reconstruction with expander and Flex HD placement, scheduled for 01/18/2023 with Dr. Ulice Bold.  The patient has not had problems with anesthesia.   Summary of Previous Visit: The patient was last seen in our office on 12/18/2022 by Dr. Ulice Bold.  In brief she went for routine mammogram was told she had left-sided breast cancer.  She is 5 feet 4 inches tall and weighs 255 pounds, she is not a smoker and does not have diabetes.  She notes she is approximately a D cup and would like to be around the same size.  She has a surgical history including biliopancreatic diversion, appendectomy, hysterectomy, cholecystectomy, hiatal hernia repair, and multiple spine surgeries.  The patient has no history of DVT or PE, she has tolerated previous surgeries without significant difficulty.  Job: Not currently working  PMH Significant for: Stroke   Past Medical History: Allergies: Allergies  Allergen Reactions   Bee Venom Swelling   Latex Hives and Rash   Meperidine Hcl Other (See Comments)   Morphine Other (See Comments)    BRADYCARDIA   Shellfish Allergy Anaphylaxis   Codeine Nausea And Vomiting   Meperidine Hcl Other (See Comments)    BRADYCARDIA   Oxycodone-Acetaminophen Hives   Acetaminophen Itching   Bee Pollen Other (See Comments)    Unknown   Iodinated Contrast Media Other (See Comments)    Swelling  Other reaction(s): Other (See Comments)  Swelling   Swelling   Oxycodone Hives and Itching    Blisters on back   Pentazocine Nausea Only and Nausea And Vomiting   Oxycodone-Acetaminophen Itching   Pentazocine  Lactate Nausea And Vomiting    REACTION: vomiting with Talwin NX   Propoxyphene Nausea Only and Nausea And Vomiting   Propoxyphene N-Acetaminophen Nausea And Vomiting    Current Medications:  Current Outpatient Medications:    aspirin EC 81 MG tablet, Take 81 mg by mouth daily. Swallow whole., Disp: , Rfl:    azelastine (ASTELIN) 0.1 % nasal spray, Place into the nose., Disp: , Rfl:    baclofen (LIORESAL) 10 MG tablet, Take 1 tablet (10 mg total) by mouth 3 (three) times daily. - for spasms, Disp: 270 tablet, Rfl: 3   diclofenac Sodium (VOLTAREN) 1 % GEL, APPLY 4 GRAMS TO AFFECTED  AREA(S) TOPICALLY 4 TIMES DAILY, Disp: 1500 g, Rfl: 1   DULoxetine (CYMBALTA) 60 MG capsule, TAKE 1 CAPSULE BY MOUTH AT  NIGHT for nerve pain, Disp: 90 capsule, Rfl: 3   EPINEPHrine 0.3 mg/0.3 mL IJ SOAJ injection, SMARTSIG:0.3 Milliliter(s) IM Once PRN, Disp: , Rfl:    fludrocortisone (FLORINEF) 0.1 MG tablet, Take 2 tablets (0.2 mg total) by mouth daily. For orthostatic hypotension-, Disp: 180 tablet, Rfl: 3   gabapentin (NEURONTIN) 300 MG capsule, Take 1 capsule (300 mg total) by mouth at bedtime. For nerve pain, Disp: 90 capsule, Rfl: 3   midodrine (PROAMATINE) 10 MG tablet, Take 1 tablet (10 mg total) by mouth 3 (three) times daily. For orthostatic hypotension, Disp: 270 tablet, Rfl: 3   montelukast (SINGULAIR) 10 MG tablet, TAKE 1 TABLET BY MOUTH AT  BEDTIME FOR ALLERGIES, Disp: 90 tablet, Rfl: 3   Multiple Vitamin (MULTIVITAMIN WITH MINERALS) TABS tablet, Take 1 tablet by mouth 2 (two) times daily., Disp: , Rfl:    MYRBETRIQ 25 MG TB24 tablet, Take 25 mg by mouth daily., Disp: , Rfl:    pramipexole (MIRAPEX) 0.125 MG tablet, Take by mouth., Disp: , Rfl:    pyridOXINE (VITAMIN B-6) 100 MG tablet, Take 1 tablet (100 mg total) by mouth daily., Disp: 90 tablet, Rfl: 1   Thiamine HCl (VITAMIN B-1) 250 MG tablet, Take 1 tablet (250 mg total) by mouth daily., Disp: 90 tablet, Rfl: 1   topiramate (TOPAMAX) 100 MG  tablet, TAKE 1 TABLET BY MOUTH AT  BEDTIME, Disp: 90 tablet, Rfl: 3   vitamin B-12 (CYANOCOBALAMIN) 1000 MCG tablet, Take 1 tablet (1,000 mcg total) by mouth daily., Disp: 90 tablet, Rfl: 1   fluticasone (FLONASE) 50 MCG/ACT nasal spray, Place 1-2 sprays into both nostrils daily., Disp: , Rfl:   Past Medical Problems: Past Medical History:  Diagnosis Date   Anemia    Cervical cancer (HCC)    Family history of adverse reaction to anesthesia     " my mother takes a long time time wake up"   GERD (gastroesophageal reflux disease)    Headache    migraine   History of dysplastic nevus 01/18/2018   right shoulder, recurrent dysplastic nevus   History of shingles    Hypertension    Migraine    Multiple allergies    Obesity    Osteoarthritis    left hip   PONV (postoperative nausea and vomiting)    Sleep apnea    does not wear CPAP   Stroke (HCC) 08/11/2020   Wears glasses     Past Surgical History: Past Surgical History:  Procedure Laterality Date   ABDOMINAL HYSTERECTOMY     APPENDECTOMY     BACK SURGERY     BILIOPANCREATIC DIVERSION     with duodenal switch laparoscopic    BREAST BIOPSY Left 12/06/2022   u/s bx,1:00 heart clip, path pending   BREAST BIOPSY Left 12/06/2022   [name] bx 2:00 ribbon clip path pending   BREAST BIOPSY Left 12/06/2022   [name] LT BREAST BX W LOC DEV EA ADD LESION IMG BX SPEC US GUIDE 12/06/2022 [facility]   BREAST BIOPSY Left 12/06/2022   [name] LT BREAST BX W LOC DEV 1ST LESION IMG BX SPEC US GUIDE 12/06/2022 [facility]   CHOLECYSTECTOMY     COLONOSCOPY N/A 06/27/2021   Procedure: COLONOSCOPY;  Surgeon: Jaynie Collins, DO;  Location: [facility] ENDOSCOPY;  Service: Gastroenterology;  Laterality: N/A;   DIAGNOSTIC LAPAROSCOPY     LOA   ESOPHAGOGASTRODUODENOSCOPY N/A 06/27/2021   Procedure: ESOPHAGOGASTRODUODENOSCOPY (EGD);  Surgeon: Jaynie Collins, DO;  Location: [facility] ENDOSCOPY;  Service: Gastroenterology;  Laterality: N/A;    ESOPHAGOGASTRODUODENOSCOPY (EGD) WITH PROPOFOL N/A 07/25/2017   Procedure: ESOPHAGOGASTRODUODENOSCOPY (EGD) WITH PROPOFOL;  Surgeon: Scot Jun, MD;  Location: [facility] ENDOSCOPY;  Service: Endoscopy;  Laterality: N/A;   KNEE ARTHROSCOPY     LAPAROSCOPIC GASTRIC SLEEVE RESECTION WITH HIATAL HERNIA REPAIR     LUMBAR FUSION  08/29/2022   Revision Lumbar two through five Laminectomy & Fusion with Transforaminal Lumbar interbody fusion   TONSILLECTOMY     TOTAL HIP ARTHROPLASTY Left 01/26/2016   Procedure: LEFT TOTAL HIP ARTHROPLASTY ANTERIOR APPROACH;  Surgeon: Tarry Kos, MD;  Location: [facility] OR;  Service: Orthopedics;  Laterality: Left;   TRANSFORAMINAL LUMBAR INTERBODY FUSION (TLIF)  WITH PEDICLE SCREW FIXATION 3 LEVEL  08/2019   Vira Browns, MD L2-L5    Social History: Social History   Socioeconomic History   Marital status: Divorced    Spouse name: Not on file   Number of children: 1   Years of education: Not on file   Highest education level: Not on file  Occupational History   Not on file  Tobacco Use   Smoking status: Never    Passive exposure: Never   Smokeless tobacco: Never  Vaping Use   Vaping Use: Never used  Substance and Sexual Activity   Alcohol use: Yes    Comment: occasional wine   Drug use: No   Sexual activity: Not Currently  Other Topics Concern   Not on file  Social History Narrative   Right handed    Uses cane to walk    Social Determinants of Health   Financial Resource Strain: Medium Risk (12/11/2022)   Overall Financial Resource Strain (CARDIA)    Difficulty of Paying Living Expenses: Somewhat hard  Food Insecurity: Food Insecurity Present (12/08/2022)   Hunger Vital Sign    Worried About Running Out of Food in the Last Year: Often true    Ran Out of Food in the Last Year: Often true  Transportation Needs: No Transportation Needs (12/08/2022)   PRAPARE - Administrator, Civil Service (Medical): No    Lack of Transportation  (Non-Medical): No  Physical Activity: Inactive (12/11/2022)   Exercise Vital Sign    Days of Exercise per Week: 0 days    Minutes of Exercise per Session: 0 min  Stress: Stress Concern Present (12/11/2022)   [employer] of Occupational Health - Occupational Stress Questionnaire    Feeling of Stress : To some extent  Social Connections: Moderately Isolated (12/11/2022)   Social Connection and Isolation Panel [NHANES]    Frequency of Communication with Friends and Family: Three times a week    Frequency of Social Gatherings with Friends and Family: Twice a week    Attends Religious Services: 1 to 4 times per year    Active Member of [employer] or Organizations: No    Attends Banker Meetings: Never    Marital Status: Divorced  Catering manager Violence: Not At Risk (12/08/2022)   Humiliation, Afraid, Rape, and Kick questionnaire    Fear of Current or Ex-Partner: No    Emotionally Abused: No    Physically Abused: No    Sexually Abused: No    Family History: Family History  Problem Relation Age of Onset   Renal Disease Mother    Hypertension Mother    Sudden Cardiac Death Mother    Heart failure Mother    Valvular heart disease Mother    Heart disease Mother    Breast cancer Maternal Grandmother    Stroke Brother    Heart attack Brother 46   Diabetes Other     Review of Systems: ROS  Physical Exam: Vital Signs BP 132/73 (BP Location: Left Arm, Patient Position: Sitting, Cuff Size: Large)   Pulse 71   SpO2 96%   Physical Exam  Constitutional:      General: Not in acute distress.    Appearance: Normal appearance. Not ill-appearing.  HENT:     Head: Normocephalic and atraumatic.  Eyes:     Pupils: Pupils are equal, round. Cardiovascular:     Rate and Rhythm: Normal rate. Pulmonary:     Effort: No respiratory distress or increased work of  breathing.  Speaks in full sentences. Musculoskeletal: Normal range of motion. No lower extremity swelling or edema. No  varicosities.  Skin:    General: Skin is warm and dry.     Findings: No erythema or rash.  Neurological:     Mental Status: Alert and oriented to person, place, and time.  Psychiatric:        Mood and Affect: Mood normal.        Behavior: Behavior normal.    Assessment/Plan: The patient is scheduled for bilateral immediate breast reconstruction with placement of tissue expander and Flex HD with Dr. Ulice Bold.  Risks, benefits, and alternatives of procedure discussed, questions answered and consent obtained.    Smoking Status: Non-smoker Last Mammogram: Multiple left-sided masses 11/23/2022  Caprini Score: 6; Risk Factors include:, Weight, length of surgery, active malignancy; Recommendation is early ambulation  Pictures obtained: Previous visit  Post-op Rx sent to pharmacy: hydrocodone, keflex  Patient was provided with the breast reconstruction consent document and Pain Medication Agreement prior to their appointment.  They had adequate time to read through the risk consent documents and Pain Medication Agreement. We also discussed them in person together during this preop appointment. All of their questions were answered to their satisfaction.  Recommended calling if they have any further questions.  Risk consent form and Pain Medication Agreement to be scanned into patient's chart.     Electronically signed by: Kelle Darting Charizma Gardiner, PA-C 01/03/2023 4:27 PM

## 2023-01-03 NOTE — H&P (View-Only) (Signed)
   Patient ID: Vanessa Medina, female    DOB: 06/15/1971, 51 y.o.   MRN: [id number]  Chief Complaint  Patient presents with   Pre-op Exam    No diagnosis found.   History of Present Illness: Vanessa Medina is a 51 y.o.  female  with a history of left-sided breast cancer.  She presents for preoperative evaluation for upcoming procedure, bilateral immediate breast reconstruction with expander and Flex HD placement, scheduled for 01/18/2023 with Dr. Dillingham.  The patient has not had problems with anesthesia.   Summary of Previous Visit: The patient was last seen in our office on 12/18/2022 by Dr. Dillingham.  In brief she went for routine mammogram was told she had left-sided breast cancer.  She is 5 feet 4 inches tall and weighs 255 pounds, she is not a smoker and does not have diabetes.  She notes she is approximately a D cup and would like to be around the same size.  She has a surgical history including biliopancreatic diversion, appendectomy, hysterectomy, cholecystectomy, hiatal hernia repair, and multiple spine surgeries.  The patient has no history of DVT or PE, she has tolerated previous surgeries without significant difficulty.  Job: Not currently working  PMH Significant for: Stroke   Past Medical History: Allergies: Allergies  Allergen Reactions   Bee Venom Swelling   Latex Hives and Rash   Meperidine Hcl Other (See Comments)   Morphine Other (See Comments)    BRADYCARDIA   Shellfish Allergy Anaphylaxis   Codeine Nausea And Vomiting   Meperidine Hcl Other (See Comments)    BRADYCARDIA   Oxycodone-Acetaminophen Hives   Acetaminophen Itching   Bee Pollen Other (See Comments)    Unknown   Iodinated Contrast Media Other (See Comments)    Swelling  Other reaction(s): Other (See Comments)  Swelling   Swelling   Oxycodone Hives and Itching    Blisters on back   Pentazocine Nausea Only and Nausea And Vomiting   Oxycodone-Acetaminophen Itching   Pentazocine  Lactate Nausea And Vomiting    REACTION: vomiting with Talwin NX   Propoxyphene Nausea Only and Nausea And Vomiting   Propoxyphene N-Acetaminophen Nausea And Vomiting    Current Medications:  Current Outpatient Medications:    aspirin EC 81 MG tablet, Take 81 mg by mouth daily. Swallow whole., Disp: , Rfl:    azelastine (ASTELIN) 0.1 % nasal spray, Place into the nose., Disp: , Rfl:    baclofen (LIORESAL) 10 MG tablet, Take 1 tablet (10 mg total) by mouth 3 (three) times daily. - for spasms, Disp: 270 tablet, Rfl: 3   diclofenac Sodium (VOLTAREN) 1 % GEL, APPLY 4 GRAMS TO AFFECTED  AREA(S) TOPICALLY 4 TIMES DAILY, Disp: 1500 g, Rfl: 1   DULoxetine (CYMBALTA) 60 MG capsule, TAKE 1 CAPSULE BY MOUTH AT  NIGHT for nerve pain, Disp: 90 capsule, Rfl: 3   EPINEPHrine 0.3 mg/0.3 mL IJ SOAJ injection, SMARTSIG:0.3 Milliliter(s) IM Once PRN, Disp: , Rfl:    fludrocortisone (FLORINEF) 0.1 MG tablet, Take 2 tablets (0.2 mg total) by mouth daily. For orthostatic hypotension-, Disp: 180 tablet, Rfl: 3   gabapentin (NEURONTIN) 300 MG capsule, Take 1 capsule (300 mg total) by mouth at bedtime. For nerve pain, Disp: 90 capsule, Rfl: 3   midodrine (PROAMATINE) 10 MG tablet, Take 1 tablet (10 mg total) by mouth 3 (three) times daily. For orthostatic hypotension, Disp: 270 tablet, Rfl: 3   montelukast (SINGULAIR) 10 MG tablet, TAKE 1 TABLET BY MOUTH AT    BEDTIME FOR ALLERGIES, Disp: 90 tablet, Rfl: 3   Multiple Vitamin (MULTIVITAMIN WITH MINERALS) TABS tablet, Take 1 tablet by mouth 2 (two) times daily., Disp: , Rfl:    MYRBETRIQ 25 MG TB24 tablet, Take 25 mg by mouth daily., Disp: , Rfl:    pramipexole (MIRAPEX) 0.125 MG tablet, Take by mouth., Disp: , Rfl:    pyridOXINE (VITAMIN B-6) 100 MG tablet, Take 1 tablet (100 mg total) by mouth daily., Disp: 90 tablet, Rfl: 1   Thiamine HCl (VITAMIN B-1) 250 MG tablet, Take 1 tablet (250 mg total) by mouth daily., Disp: 90 tablet, Rfl: 1   topiramate (TOPAMAX) 100 MG  tablet, TAKE 1 TABLET BY MOUTH AT  BEDTIME, Disp: 90 tablet, Rfl: 3   vitamin B-12 (CYANOCOBALAMIN) 1000 MCG tablet, Take 1 tablet (1,000 mcg total) by mouth daily., Disp: 90 tablet, Rfl: 1   fluticasone (FLONASE) 50 MCG/ACT nasal spray, Place 1-2 sprays into both nostrils daily., Disp: , Rfl:   Past Medical Problems: Past Medical History:  Diagnosis Date   Anemia    Cervical cancer (HCC)    Family history of adverse reaction to anesthesia     " my mother takes a long time time wake up"   GERD (gastroesophageal reflux disease)    Headache    migraine   History of dysplastic nevus 01/18/2018   right shoulder, recurrent dysplastic nevus   History of shingles    Hypertension    Migraine    Multiple allergies    Obesity    Osteoarthritis    left hip   PONV (postoperative nausea and vomiting)    Sleep apnea    does not wear CPAP   Stroke (HCC) 08/11/2020   Wears glasses     Past Surgical History: Past Surgical History:  Procedure Laterality Date   ABDOMINAL HYSTERECTOMY     APPENDECTOMY     BACK SURGERY     BILIOPANCREATIC DIVERSION     with duodenal switch laparoscopic    BREAST BIOPSY Left 12/06/2022   u/s bx,1:00 heart clip, path pending   BREAST BIOPSY Left 12/06/2022   us bx 2:00 ribbon clip path pending   BREAST BIOPSY Left 12/06/2022   US LT BREAST BX W LOC DEV EA ADD LESION IMG BX SPEC US GUIDE 12/06/2022 [facility]   BREAST BIOPSY Left 12/06/2022   US LT BREAST BX W LOC DEV 1ST LESION IMG BX SPEC US GUIDE 12/06/2022 [facility]   CHOLECYSTECTOMY     COLONOSCOPY N/A 06/27/2021   Procedure: COLONOSCOPY;  Surgeon: Russo, Steven Michael, DO;  Location: [facility] ENDOSCOPY;  Service: Gastroenterology;  Laterality: N/A;   DIAGNOSTIC LAPAROSCOPY     LOA   ESOPHAGOGASTRODUODENOSCOPY N/A 06/27/2021   Procedure: ESOPHAGOGASTRODUODENOSCOPY (EGD);  Surgeon: Russo, Steven Michael, DO;  Location: [facility] ENDOSCOPY;  Service: Gastroenterology;  Laterality: N/A;    ESOPHAGOGASTRODUODENOSCOPY (EGD) WITH PROPOFOL N/A 07/25/2017   Procedure: ESOPHAGOGASTRODUODENOSCOPY (EGD) WITH PROPOFOL;  Surgeon: Elliott, Robert T, MD;  Location: [facility] ENDOSCOPY;  Service: Endoscopy;  Laterality: N/A;   KNEE ARTHROSCOPY     LAPAROSCOPIC GASTRIC SLEEVE RESECTION WITH HIATAL HERNIA REPAIR     LUMBAR FUSION  08/29/2022   Revision Lumbar two through five Laminectomy & Fusion with Transforaminal Lumbar interbody fusion   TONSILLECTOMY     TOTAL HIP ARTHROPLASTY Left 01/26/2016   Procedure: LEFT TOTAL HIP ARTHROPLASTY ANTERIOR APPROACH;  Surgeon: Naiping M Xu, MD;  Location: [facility] OR;  Service: Orthopedics;  Laterality: Left;   TRANSFORAMINAL LUMBAR INTERBODY FUSION (TLIF)   WITH PEDICLE SCREW FIXATION 3 LEVEL  08/2019   James Nitka, MD L2-L5    Social History: Social History   Socioeconomic History   Marital status: Divorced    Spouse name: Not on file   Number of children: 1   Years of education: Not on file   Highest education level: Not on file  Occupational History   Not on file  Tobacco Use   Smoking status: Never    Passive exposure: Never   Smokeless tobacco: Never  Vaping Use   Vaping Use: Never used  Substance and Sexual Activity   Alcohol use: Yes    Comment: occasional wine   Drug use: No   Sexual activity: Not Currently  Other Topics Concern   Not on file  Social History Narrative   Right handed    Uses cane to walk    Social Determinants of Health   Financial Resource Strain: Medium Risk (12/11/2022)   Overall Financial Resource Strain (CARDIA)    Difficulty of Paying Living Expenses: Somewhat hard  Food Insecurity: Food Insecurity Present (12/08/2022)   Hunger Vital Sign    Worried About Running Out of Food in the Last Year: Often true    Ran Out of Food in the Last Year: Often true  Transportation Needs: No Transportation Needs (12/08/2022)   PRAPARE - Transportation    Lack of Transportation (Medical): No    Lack of Transportation  (Non-Medical): No  Physical Activity: Inactive (12/11/2022)   Exercise Vital Sign    Days of Exercise per Week: 0 days    Minutes of Exercise per Session: 0 min  Stress: Stress Concern Present (12/11/2022)   [employer] - Occupational Stress Questionnaire    Feeling of Stress : To some extent  Social Connections: Moderately Isolated (12/11/2022)   Social Connection and Isolation Panel [NHANES]    Frequency of Communication with Friends and Family: Three times a week    Frequency of Social Gatherings with Friends and Family: Twice a week    Attends Religious Services: 1 to 4 times per year    Active Member of Clubs or Organizations: No    Attends Club or Organization Meetings: Never    Marital Status: Divorced  Intimate Partner Violence: Not At Risk (12/08/2022)   Humiliation, Afraid, Rape, and Kick questionnaire    Fear of Current or Ex-Partner: No    Emotionally Abused: No    Physically Abused: No    Sexually Abused: No    Family History: Family History  Problem Relation Age of Onset   Renal Disease Mother    Hypertension Mother    Sudden Cardiac Death Mother    Heart failure Mother    Valvular heart disease Mother    Heart disease Mother    Breast cancer Maternal Grandmother    Stroke Brother    Heart attack Brother 46   Diabetes Other     Review of Systems: ROS  Physical Exam: Vital Signs BP 132/73 (BP Location: Left Arm, Patient Position: Sitting, Cuff Size: Large)   Pulse 71   SpO2 96%   Physical Exam  Constitutional:      General: Not in acute distress.    Appearance: Normal appearance. Not ill-appearing.  HENT:     Head: Normocephalic and atraumatic.  Eyes:     Pupils: Pupils are equal, round. Cardiovascular:     Rate and Rhythm: Normal rate. Pulmonary:     Effort: No respiratory distress or increased work of   breathing.  Speaks in full sentences. Musculoskeletal: Normal range of motion. No lower extremity swelling or edema. No  varicosities.  Skin:    General: Skin is warm and dry.     Findings: No erythema or rash.  Neurological:     Mental Status: Alert and oriented to person, place, and time.  Psychiatric:        Mood and Affect: Mood normal.        Behavior: Behavior normal.    Assessment/Plan: The patient is scheduled for bilateral immediate breast reconstruction with placement of tissue expander and Flex HD with Dr. Dillingham.  Risks, benefits, and alternatives of procedure discussed, questions answered and consent obtained.    Smoking Status: Non-smoker Last Mammogram: Multiple left-sided masses 11/23/2022  Caprini Score: 6; Risk Factors include:, Weight, length of surgery, active malignancy; Recommendation is early ambulation  Pictures obtained: Previous visit  Post-op Rx sent to pharmacy: hydrocodone, keflex  Patient was provided with the breast reconstruction consent document and Pain Medication Agreement prior to their appointment.  They had adequate time to read through the risk consent documents and Pain Medication Agreement. We also discussed them in person together during this preop appointment. All of their questions were answered to their satisfaction.  Recommended calling if they have any further questions.  Risk consent form and Pain Medication Agreement to be scanned into patient's chart.     Electronically signed by: Addeline Calarco Todd Marlyce Mcdougald, PA-C 01/03/2023 4:27 PM  

## 2023-01-04 ENCOUNTER — Other Ambulatory Visit: Payer: Self-pay | Admitting: Physical Medicine and Rehabilitation

## 2023-01-04 MED ORDER — MONTELUKAST SODIUM 10 MG PO TABS: ORAL_TABLET | ORAL | 1 refills | Status: AC

## 2023-01-04 NOTE — Telephone Encounter (Signed)
Pt called and got in touch with her finally after calling 5 different times- according to pt- her phone will send to voicemail if doesn't recognize the number- explained she needs ot turn that off, if she wants ot get in touch with [name].   I explained I've sent Rx's- excluding Singulair and Prampexole to [facility] 11/13/22 and another written Rx ~ 12/18/22- so I know they got Rx's  They are probably waiting for Rx for Prampexole which comes from pt's PCP-  So she needs to get this addressed-- I cannot get addressed since I don't write for it.  Also asked pt to have Singulair from PCP- I only wrote for a few months until could get form PCP.  She voiced understanding.

## 2023-01-09 NOTE — Progress Notes (Signed)
Enrolled Vanessa Medina into the [employer]

## 2023-01-10 ENCOUNTER — Encounter: Payer: Self-pay | Admitting: *Deleted

## 2023-01-10 ENCOUNTER — Encounter
Admission: RE | Admit: 2023-01-10 | Discharge: 2023-01-10 | Disposition: A | Discharge status: Home / Self Care | Payer: Medicaid Other | Source: Ambulatory Visit | Attending: Surgery | Admitting: Surgery

## 2023-01-10 ENCOUNTER — Other Ambulatory Visit: Payer: Self-pay

## 2023-01-10 ENCOUNTER — Encounter: Payer: Medicaid Other | Admitting: Physician Assistant

## 2023-01-10 DIAGNOSIS — Z01810 Encounter for preprocedural cardiovascular examination: Secondary | ICD-10-CM | POA: Insufficient documentation

## 2023-01-10 DIAGNOSIS — R030 Elevated blood-pressure reading, without diagnosis of hypertension: Secondary | ICD-10-CM

## 2023-01-10 DIAGNOSIS — R001 Bradycardia, unspecified: Secondary | ICD-10-CM | POA: Diagnosis not present

## 2023-01-10 DIAGNOSIS — C50812 Malignant neoplasm of overlapping sites of left female breast: Secondary | ICD-10-CM

## 2023-01-10 HISTORY — DX: Personal history of urinary calculi: Z87.442

## 2023-01-10 HISTORY — DX: Pneumonia, unspecified organism: J18.9

## 2023-01-10 HISTORY — DX: Anxiety disorder, unspecified: F41.9

## 2023-01-10 NOTE — Patient Instructions (Addendum)
Your procedure is scheduled on: 01/18/23 - Thursday Report to the Registration Desk on the 1st floor of the [facility]. To find out your arrival time, please call [phone] between 1PM - 3PM on: 01/17/23 - Wednesday If your arrival time is 6:00 am, do not arrive before that time as the [facility] entrance doors do not open until 6:00 am.  REMEMBER: Instructions that are not followed completely may result in serious medical risk, up to and including death; or upon the discretion of your surgeon and anesthesiologist your surgery may need to be rescheduled.  Do not eat food or drink any liquids after midnight the night before surgery.  No gum chewing or hard candies.   One week prior to surgery: Stop Anti-inflammatories (NSAIDS) such as Advil, Aleve, Ibuprofen, Motrin, Naproxen, Naprosyn and Aspirin based products such as Excedrin, Goody's Powder, BC Powder.  Stop ANY OVER THE COUNTER supplements until after surgery.  You may take Tylenol if needed for pain up until the day of surgery.  TAKE ONLY THESE MEDICATIONS THE MORNING OF SURGERY WITH A SIP OF WATER:  fludrocortisone (FLORINEF)  midodrine (PROAMATINE)  MYRBETRIQ  pramipexole (MIRAPEX)    HOLD Aspirin beginning 01/13/23.   No Alcohol for 24 hours before or after surgery.  No Smoking including e-cigarettes for 24 hours before surgery.  No chewable tobacco products for at least 6 hours before surgery.  No nicotine patches on the day of surgery.  Do not use any "recreational" drugs for at least a week (preferably 2 weeks) before your surgery.  Please be advised that the combination of cocaine and anesthesia may have negative outcomes, up to and including death. If you test positive for cocaine, your surgery will be cancelled.  On the morning of surgery brush your teeth with toothpaste and water, you may rinse your mouth with mouthwash if you wish. Do not swallow any toothpaste or mouthwash.  Use CHG Soap or wipes  as directed on instruction sheet.  Do not wear jewelry, make-up, hairpins, clips or nail polish.  Do not wear lotions, powders, or perfumes.   Do not shave body hair from the neck down 48 hours before surgery.  Contact lenses, hearing aids and dentures may not be worn into surgery.  Do not bring valuables to the hospital. [facility] is not responsible for any missing/lost belongings or valuables.    Notify your doctor if there is any change in your medical condition (cold, fever, infection).  Wear comfortable clothing (specific to your surgery type) to the hospital.  After surgery, you can help prevent lung complications by doing breathing exercises.  Take deep breaths and cough every 1-2 hours. Your doctor may order a device called an Incentive Spirometer to help you take deep breaths. When coughing or sneezing, hold a pillow firmly against your incision with both hands. This is called "splinting." Doing this helps protect your incision. It also decreases belly discomfort.  If you are being admitted to the hospital overnight, leave your suitcase in the car. After surgery it may be brought to your room.  In case of increased patient census, it may be necessary for you, the patient, to continue your postoperative care in the Same Day Surgery department.  If you are being discharged the day of surgery, you will not be allowed to drive home. You will need a responsible individual to drive you home and stay with you for 24 hours after surgery.   If you are taking public transportation, you  will need to have a responsible individual with you.  Please call the Pre-admissions Testing Dept. at [phone] if you have any questions about these instructions.  Surgery Visitation Policy:  Patients having surgery or a procedure may have two visitors.  Children under the age of 38 must have an adult with them who is not the patient.  Inpatient Visitation:    Visiting hours are 7 a.m. to 8  p.m. Up to four visitors are allowed at one time in a patient room. The visitors may rotate out with other people during the day.  One visitor age 1 or older may stay with the patient overnight and must be in the room by 8 p.m.    Preparing for Surgery with CHLORHEXIDINE GLUCONATE (CHG) Soap  Chlorhexidine Gluconate (CHG) Soap  o An antiseptic cleaner that kills germs and bonds with the skin to continue killing germs even after washing  o Used for showering the night before surgery and morning of surgery  Before surgery, you can play an important role by reducing the number of germs on your skin.  CHG (Chlorhexidine gluconate) soap is an antiseptic cleanser which kills germs and bonds with the skin to continue killing germs even after washing.  Please do not use if you have an allergy to CHG or antibacterial soaps. If your skin becomes reddened/irritated stop using the CHG.  1. Shower the NIGHT BEFORE SURGERY and the MORNING OF SURGERY with CHG soap.  2. If you choose to wash your hair, wash your hair first as usual with your normal shampoo.  3. After shampooing, rinse your hair and body thoroughly to remove the shampoo.  4. Use CHG as you would any other liquid soap. You can apply CHG directly to the skin and wash gently with a scrungie or a clean washcloth.  5. Apply the CHG soap to your body only from the neck down. Do not use on open wounds or open sores. Avoid contact with your eyes, ears, mouth, and genitals (private parts). Wash face and genitals (private parts) with your normal soap.  6. Wash thoroughly, paying special attention to the area where your surgery will be performed.  7. Thoroughly rinse your body with warm water.  8. Do not shower/wash with your normal soap after using and rinsing off the CHG soap.  9. Pat yourself dry with a clean towel.  10. Wear clean pajamas to bed the night before surgery.  12. Place clean sheets on your bed the night of your first  shower and do not sleep with pets.  13. Shower again with the CHG soap on the day of surgery prior to arriving at the hospital.  14. Do not apply any deodorants/lotions/powders.  15. Please wear clean clothes to the hospital.

## 2023-01-18 ENCOUNTER — Ambulatory Visit
Admission: RE | Admit: 2023-01-18 | Discharge: 2023-01-18 | Disposition: A | Discharge status: Home / Self Care | Payer: BLUE CROSS/BLUE SHIELD | Source: Ambulatory Visit | Attending: Surgery | Admitting: Surgery

## 2023-01-18 ENCOUNTER — Encounter
Admission: RE | Admit: 2023-01-18 | Discharge: 2023-01-18 | Disposition: A | Discharge status: Home / Self Care | Payer: BLUE CROSS/BLUE SHIELD | Source: Ambulatory Visit | Attending: Surgery | Admitting: Surgery

## 2023-01-18 ENCOUNTER — Encounter
Admission: RE | Disposition: A | Discharge status: Home / Self Care | Payer: Self-pay | Source: Home / Self Care | Attending: Surgery

## 2023-01-18 ENCOUNTER — Observation Stay
Admission: RE | Admit: 2023-01-18 | Discharge: 2023-01-19 | Disposition: A | Discharge status: Home / Self Care | Payer: BLUE CROSS/BLUE SHIELD | Attending: Surgery | Admitting: Surgery

## 2023-01-18 ENCOUNTER — Encounter: Payer: Self-pay | Admitting: Surgery

## 2023-01-18 ENCOUNTER — Ambulatory Visit: Payer: BLUE CROSS/BLUE SHIELD | Admitting: Anesthesiology

## 2023-01-18 ENCOUNTER — Ambulatory Visit: Payer: BLUE CROSS/BLUE SHIELD | Admitting: Urgent Care

## 2023-01-18 ENCOUNTER — Other Ambulatory Visit: Payer: Self-pay

## 2023-01-18 DIAGNOSIS — C50412 Malignant neoplasm of upper-outer quadrant of left female breast: Secondary | ICD-10-CM | POA: Diagnosis present

## 2023-01-18 DIAGNOSIS — I10 Essential (primary) hypertension: Secondary | ICD-10-CM | POA: Diagnosis not present

## 2023-01-18 DIAGNOSIS — Z91040 Latex allergy status: Secondary | ICD-10-CM | POA: Diagnosis not present

## 2023-01-18 DIAGNOSIS — Z8541 Personal history of malignant neoplasm of cervix uteri: Secondary | ICD-10-CM | POA: Insufficient documentation

## 2023-01-18 DIAGNOSIS — Z421 Encounter for breast reconstruction following mastectomy: Secondary | ICD-10-CM

## 2023-01-18 DIAGNOSIS — Z4001 Encounter for prophylactic removal of breast: Secondary | ICD-10-CM | POA: Diagnosis not present

## 2023-01-18 DIAGNOSIS — C50919 Malignant neoplasm of unspecified site of unspecified female breast: Secondary | ICD-10-CM

## 2023-01-18 DIAGNOSIS — Z8673 Personal history of transient ischemic attack (TIA), and cerebral infarction without residual deficits: Secondary | ICD-10-CM | POA: Insufficient documentation

## 2023-01-18 DIAGNOSIS — Z96642 Presence of left artificial hip joint: Secondary | ICD-10-CM | POA: Diagnosis not present

## 2023-01-18 DIAGNOSIS — Z79899 Other long term (current) drug therapy: Secondary | ICD-10-CM | POA: Insufficient documentation

## 2023-01-18 DIAGNOSIS — Z170 Estrogen receptor positive status [ER+]: Secondary | ICD-10-CM | POA: Diagnosis not present

## 2023-01-18 DIAGNOSIS — Z9013 Acquired absence of bilateral breasts and nipples: Secondary | ICD-10-CM

## 2023-01-18 DIAGNOSIS — Z853 Personal history of malignant neoplasm of breast: Secondary | ICD-10-CM

## 2023-01-18 HISTORY — PX: MASTECTOMY W/ SENTINEL NODE BIOPSY: SHX2001

## 2023-01-18 HISTORY — DX: Malignant neoplasm of unspecified site of unspecified female breast: C50.919

## 2023-01-18 HISTORY — PX: BREAST RECONSTRUCTION WITH PLACEMENT OF TISSUE EXPANDER AND FLEX HD (ACELLULAR HYDRATED DERMIS): SHX6295

## 2023-01-18 LAB — CBC
HCT: 38 % (ref 36.0–46.0)
Hemoglobin: 12.1 g/dL (ref 12.0–15.0)
MCH: 31.9 pg (ref 26.0–34.0)
MCHC: 31.8 g/dL (ref 30.0–36.0)
MCV: 100.3 fL — ABNORMAL HIGH (ref 80.0–100.0)
Platelets: 297 10*3/uL (ref 150–400)
RBC: 3.79 MIL/uL — ABNORMAL LOW (ref 3.87–5.11)
RDW: 13.2 % (ref 11.5–15.5)
WBC: 11.3 10*3/uL — ABNORMAL HIGH (ref 4.0–10.5)
nRBC: 0 % (ref 0.0–0.2)

## 2023-01-18 LAB — CREATININE, SERUM
Creatinine, Ser: 0.99 mg/dL (ref 0.44–1.00)
GFR, Estimated: >60 mL/min (ref >60)

## 2023-01-18 SURGERY — MASTECTOMY WITH SENTINEL LYMPH NODE BIOPSY
Anesthesia: General | Site: Breast | Laterality: Bilateral

## 2023-01-18 MED ORDER — BUPIVACAINE LIPOSOME 1.3 % IJ SUSP
INTRAMUSCULAR | Status: AC
  Filled 2023-01-18: qty 20

## 2023-01-18 MED ORDER — PROPOFOL 10 MG/ML IV BOLUS
INTRAVENOUS | Status: AC
  Filled 2023-01-18: qty 20

## 2023-01-18 MED ORDER — PROMETHAZINE HCL 25 MG/ML IJ SOLN: 6.2500 mg | INTRAMUSCULAR | Status: DC | PRN

## 2023-01-18 MED ORDER — FENTANYL CITRATE (PF) 100 MCG/2ML IJ SOLN
INTRAMUSCULAR | Status: AC
  Filled 2023-01-18: qty 2

## 2023-01-18 MED ORDER — ONDANSETRON HCL 4 MG/2ML IJ SOLN
INTRAMUSCULAR | Status: AC
  Filled 2023-01-18: qty 4

## 2023-01-18 MED ORDER — BACLOFEN 10 MG PO TABS
10.0000 mg | ORAL_TABLET | Freq: Three times a day (TID) | ORAL | Status: DC
  Administered 2023-01-18 – 2023-01-19 (×3): 10 mg via ORAL
  Filled 2023-01-18 (×3): qty 1

## 2023-01-18 MED ORDER — CHLORHEXIDINE GLUCONATE CLOTH 2 % EX PADS
6.0000 | MEDICATED_PAD | Freq: Once | CUTANEOUS | Status: AC
  Administered 2023-01-18: 6 via TOPICAL

## 2023-01-18 MED ORDER — LIDOCAINE HCL (PF) 2 % IJ SOLN
INTRAMUSCULAR | Status: AC
  Filled 2023-01-18: qty 5

## 2023-01-18 MED ORDER — ACETAMINOPHEN 500 MG PO TABS
ORAL_TABLET | ORAL | Status: AC
  Filled 2023-01-18: qty 2

## 2023-01-18 MED ORDER — PROPOFOL 1000 MG/100ML IV EMUL
INTRAVENOUS | Status: AC
  Filled 2023-01-18: qty 100

## 2023-01-18 MED ORDER — ACETAMINOPHEN 500 MG PO TABS: 1000.0000 mg | ORAL_TABLET | Freq: Four times a day (QID) | ORAL | Status: DC | PRN

## 2023-01-18 MED ORDER — FLUTICASONE PROPIONATE 50 MCG/ACT NA SUSP: 1.0000 | Freq: Every day | NASAL | Status: DC | PRN

## 2023-01-18 MED ORDER — SUGAMMADEX SODIUM 200 MG/2ML IV SOLN
INTRAVENOUS | Status: DC | PRN
  Administered 2023-01-18: 120 mg via INTRAVENOUS

## 2023-01-18 MED ORDER — FENTANYL CITRATE (PF) 100 MCG/2ML IJ SOLN
INTRAMUSCULAR | Status: DC | PRN
  Administered 2023-01-18: 50 ug via INTRAVENOUS
  Administered 2023-01-18: 25 ug via INTRAVENOUS
  Administered 2023-01-18: 50 ug via INTRAVENOUS
  Administered 2023-01-18 (×3): 25 ug via INTRAVENOUS

## 2023-01-18 MED ORDER — CELECOXIB 200 MG PO CAPS
200.0000 mg | ORAL_CAPSULE | ORAL | Status: AC
  Administered 2023-01-18: 200 mg via ORAL

## 2023-01-18 MED ORDER — FAMOTIDINE 20 MG PO TABS
ORAL_TABLET | ORAL | Status: AC
  Filled 2023-01-18: qty 1

## 2023-01-18 MED ORDER — DEXAMETHASONE SODIUM PHOSPHATE 10 MG/ML IJ SOLN
INTRAMUSCULAR | Status: AC
  Filled 2023-01-18: qty 1

## 2023-01-18 MED ORDER — ACETAMINOPHEN 500 MG PO TABS
1000.0000 mg | ORAL_TABLET | Freq: Four times a day (QID) | ORAL | Status: DC
  Administered 2023-01-18 – 2023-01-19 (×3): 1000 mg via ORAL
  Filled 2023-01-18 (×3): qty 2

## 2023-01-18 MED ORDER — SODIUM CHLORIDE 0.9 % IV SOLN: INTRAVENOUS | Status: DC

## 2023-01-18 MED ORDER — ACETAMINOPHEN 500 MG PO TABS
1000.0000 mg | ORAL_TABLET | Freq: Once | ORAL | Status: AC
  Administered 2023-01-18: 1000 mg via ORAL

## 2023-01-18 MED ORDER — TOPIRAMATE 100 MG PO TABS
100.0000 mg | ORAL_TABLET | Freq: Every day | ORAL | Status: DC
  Administered 2023-01-18: 100 mg via ORAL
  Filled 2023-01-18: qty 1

## 2023-01-18 MED ORDER — CELECOXIB 200 MG PO CAPS
ORAL_CAPSULE | ORAL | Status: AC
  Filled 2023-01-18: qty 1

## 2023-01-18 MED ORDER — HEPARIN SODIUM (PORCINE) 5000 UNIT/ML IJ SOLN: 5000.0000 [IU] | Freq: Three times a day (TID) | INTRAMUSCULAR | Status: DC

## 2023-01-18 MED ORDER — HEMOSTATIC AGENTS (NO CHARGE) OPTIME
TOPICAL | Status: DC | PRN
  Administered 2023-01-18: 2 via TOPICAL

## 2023-01-18 MED ORDER — ONDANSETRON HCL 4 MG/2ML IJ SOLN: 4.0000 mg | Freq: Four times a day (QID) | INTRAMUSCULAR | Status: DC | PRN

## 2023-01-18 MED ORDER — CHLORHEXIDINE GLUCONATE 0.12 % MT SOLN
15.0000 mL | Freq: Once | OROMUCOSAL | Status: AC
  Administered 2023-01-18: 15 mL via OROMUCOSAL

## 2023-01-18 MED ORDER — CHLORHEXIDINE GLUCONATE CLOTH 2 % EX PADS: 6.0000 | MEDICATED_PAD | Freq: Once | CUTANEOUS | Status: DC

## 2023-01-18 MED ORDER — BUPIVACAINE HCL (PF) 0.25 % IJ SOLN
INTRAMUSCULAR | Status: AC
  Filled 2023-01-18: qty 30

## 2023-01-18 MED ORDER — DEXAMETHASONE SODIUM PHOSPHATE 10 MG/ML IJ SOLN
INTRAMUSCULAR | Status: DC | PRN
  Administered 2023-01-18: 10 mg via INTRAVENOUS

## 2023-01-18 MED ORDER — EPINEPHRINE PF 1 MG/ML IJ SOLN
INTRAMUSCULAR | Status: AC
  Filled 2023-01-18: qty 1

## 2023-01-18 MED ORDER — FAMOTIDINE 20 MG PO TABS
20.0000 mg | ORAL_TABLET | Freq: Once | ORAL | Status: AC
  Administered 2023-01-18: 20 mg via ORAL

## 2023-01-18 MED ORDER — 0.9 % SODIUM CHLORIDE (POUR BTL) OPTIME
TOPICAL | Status: DC | PRN
  Administered 2023-01-18: 1000 mL

## 2023-01-18 MED ORDER — VANCOMYCIN HCL 1000 MG IV SOLR
INTRAVENOUS | Status: AC
  Filled 2023-01-18: qty 20

## 2023-01-18 MED ORDER — GABAPENTIN 300 MG PO CAPS
ORAL_CAPSULE | ORAL | Status: AC
  Filled 2023-01-18: qty 1

## 2023-01-18 MED ORDER — ROCURONIUM BROMIDE 10 MG/ML (PF) SYRINGE
PREFILLED_SYRINGE | INTRAVENOUS | Status: AC
  Filled 2023-01-18: qty 10

## 2023-01-18 MED ORDER — HYDROMORPHONE HCL 1 MG/ML IJ SOLN
0.2500 mg | INTRAMUSCULAR | Status: DC | PRN
  Administered 2023-01-18 (×4): 0.25 mg via INTRAVENOUS

## 2023-01-18 MED ORDER — CEFAZOLIN SODIUM-DEXTROSE 2-4 GM/100ML-% IV SOLN
2.0000 g | INTRAVENOUS | Status: DC
Start: 2023-01-18 — End: 2023-01-18

## 2023-01-18 MED ORDER — ROCURONIUM BROMIDE 100 MG/10ML IV SOLN
INTRAVENOUS | Status: DC | PRN
  Administered 2023-01-18: 50 mg via INTRAVENOUS

## 2023-01-18 MED ORDER — METHOCARBAMOL 500 MG PO TABS: 500.0000 mg | ORAL_TABLET | Freq: Three times a day (TID) | ORAL | Status: DC | PRN

## 2023-01-18 MED ORDER — SODIUM CHLORIDE FLUSH 0.9 % IV SOLN
INTRAVENOUS | Status: AC
  Filled 2023-01-18: qty 10

## 2023-01-18 MED ORDER — DROPERIDOL 2.5 MG/ML IJ SOLN: 0.6250 mg | Freq: Once | INTRAMUSCULAR | Status: DC | PRN

## 2023-01-18 MED ORDER — PRAMIPEXOLE DIHYDROCHLORIDE 0.25 MG PO TABS
0.1250 mg | ORAL_TABLET | Freq: Three times a day (TID) | ORAL | Status: DC
  Administered 2023-01-18 – 2023-01-19 (×3): 0.125 mg via ORAL
  Filled 2023-01-18 (×3): qty 1

## 2023-01-18 MED ORDER — STERILE WATER FOR IRRIGATION IR SOLN
Status: DC | PRN
  Administered 2023-01-18: 3000 mL

## 2023-01-18 MED ORDER — ISOSULFAN BLUE 1 % ~~LOC~~ SOLN
SUBCUTANEOUS | Status: DC | PRN
  Administered 2023-01-18: 50 mg via SUBCUTANEOUS

## 2023-01-18 MED ORDER — MIDAZOLAM HCL 2 MG/2ML IJ SOLN
INTRAMUSCULAR | Status: DC | PRN
  Administered 2023-01-18: 2 mg via INTRAVENOUS

## 2023-01-18 MED ORDER — VANCOMYCIN HCL 1 G IV SOLR
INTRAVENOUS | Status: DC | PRN
  Administered 2023-01-18: 1000 mg via TOPICAL

## 2023-01-18 MED ORDER — LIDOCAINE HCL (CARDIAC) PF 100 MG/5ML IV SOSY
PREFILLED_SYRINGE | INTRAVENOUS | Status: DC | PRN
  Administered 2023-01-18: 100 mg via INTRAVENOUS

## 2023-01-18 MED ORDER — GABAPENTIN 300 MG PO CAPS
300.0000 mg | ORAL_CAPSULE | ORAL | Status: AC
  Administered 2023-01-18: 300 mg via ORAL

## 2023-01-18 MED ORDER — FLUDROCORTISONE ACETATE 0.1 MG PO TABS
0.2000 mg | ORAL_TABLET | Freq: Every day | ORAL | Status: DC
  Administered 2023-01-19: 0.2 mg via ORAL
  Filled 2023-01-18: qty 2

## 2023-01-18 MED ORDER — PROPOFOL 500 MG/50ML IV EMUL
INTRAVENOUS | Status: DC | PRN
  Administered 2023-01-18: 50 ug/kg/min via INTRAVENOUS

## 2023-01-18 MED ORDER — MIDAZOLAM HCL 2 MG/2ML IJ SOLN
INTRAMUSCULAR | Status: AC
  Filled 2023-01-18: qty 2

## 2023-01-18 MED ORDER — SODIUM CHLORIDE FLUSH 0.9 % IV SOLN
INTRAVENOUS | Status: AC
  Filled 2023-01-18: qty 50

## 2023-01-18 MED ORDER — ORAL CARE MOUTH RINSE: 15.0000 mL | Freq: Once | OROMUCOSAL | Status: AC

## 2023-01-18 MED ORDER — ISOSULFAN BLUE 1 % ~~LOC~~ SOLN
SUBCUTANEOUS | Status: AC
  Filled 2023-01-18: qty 5

## 2023-01-18 MED ORDER — GABAPENTIN 300 MG PO CAPS
300.0000 mg | ORAL_CAPSULE | Freq: Every day | ORAL | Status: DC
  Administered 2023-01-18: 300 mg via ORAL
  Filled 2023-01-18: qty 1

## 2023-01-18 MED ORDER — CEFAZOLIN SODIUM-DEXTROSE 2-4 GM/100ML-% IV SOLN
2.0000 g | Freq: Three times a day (TID) | INTRAVENOUS | Status: AC
  Administered 2023-01-18 (×2): 2 g via INTRAVENOUS
  Filled 2023-01-18 (×3): qty 100

## 2023-01-18 MED ORDER — HYDROMORPHONE HCL 1 MG/ML IJ SOLN
0.5000 mg | INTRAMUSCULAR | Status: DC | PRN
  Administered 2023-01-18: 0.5 mg via INTRAVENOUS
  Filled 2023-01-18 (×2): qty 0.5

## 2023-01-18 MED ORDER — CEFAZOLIN SODIUM-DEXTROSE 2-4 GM/100ML-% IV SOLN
2.0000 g | INTRAVENOUS | Status: AC
  Administered 2023-01-18: 2 g via INTRAVENOUS

## 2023-01-18 MED ORDER — HYDROMORPHONE HCL 1 MG/ML IJ SOLN
INTRAMUSCULAR | Status: AC
  Filled 2023-01-18: qty 1

## 2023-01-18 MED ORDER — DIPHENHYDRAMINE HCL 12.5 MG/5ML PO ELIX: 12.5000 mg | ORAL_SOLUTION | Freq: Four times a day (QID) | ORAL | Status: DC | PRN

## 2023-01-18 MED ORDER — SODIUM CHLORIDE 0.9 % IV SOLN
INTRAVENOUS | Status: AC | PRN
  Administered 2023-01-18: 500 mL via INTRAMUSCULAR

## 2023-01-18 MED ORDER — DIPHENHYDRAMINE HCL 50 MG/ML IJ SOLN: 12.5000 mg | Freq: Four times a day (QID) | INTRAMUSCULAR | Status: DC | PRN

## 2023-01-18 MED ORDER — SODIUM CHLORIDE (PF) 0.9 % IJ SOLN
INTRAMUSCULAR | Status: DC | PRN
  Administered 2023-01-18: 40 mL

## 2023-01-18 MED ORDER — CEFAZOLIN SODIUM-DEXTROSE 2-4 GM/100ML-% IV SOLN
INTRAVENOUS | Status: AC
  Filled 2023-01-18: qty 100

## 2023-01-18 MED ORDER — PROPOFOL 10 MG/ML IV BOLUS
INTRAVENOUS | Status: DC | PRN
  Administered 2023-01-18: 150 mg via INTRAVENOUS

## 2023-01-18 MED ORDER — ONDANSETRON HCL 4 MG/2ML IJ SOLN
INTRAMUSCULAR | Status: DC | PRN
  Administered 2023-01-18: 8 mg via INTRAVENOUS

## 2023-01-18 MED ORDER — METHOCARBAMOL 1000 MG/10ML IJ SOLN: 500.0000 mg | Freq: Three times a day (TID) | INTRAVENOUS | Status: DC | PRN

## 2023-01-18 MED ORDER — PANTOPRAZOLE SODIUM 40 MG IV SOLR
40.0000 mg | Freq: Two times a day (BID) | INTRAVENOUS | Status: DC
  Administered 2023-01-18: 40 mg via INTRAVENOUS
  Filled 2023-01-18 (×2): qty 10

## 2023-01-18 MED ORDER — TECHNETIUM TC 99M TILMANOCEPT KIT
1.0580 | PACK | Freq: Once | INTRAVENOUS | Status: AC | PRN
  Administered 2023-01-18: 1.058 via INTRADERMAL

## 2023-01-18 MED ORDER — KETOROLAC TROMETHAMINE 15 MG/ML IJ SOLN
15.0000 mg | Freq: Four times a day (QID) | INTRAMUSCULAR | Status: DC
  Administered 2023-01-18 – 2023-01-19 (×3): 15 mg via INTRAVENOUS
  Filled 2023-01-18 (×3): qty 1

## 2023-01-18 MED ORDER — ONDANSETRON 4 MG PO TBDP: 4.0000 mg | ORAL_TABLET | Freq: Four times a day (QID) | ORAL | Status: DC | PRN

## 2023-01-18 MED ORDER — MIDODRINE HCL 5 MG PO TABS
10.0000 mg | ORAL_TABLET | Freq: Three times a day (TID) | ORAL | Status: DC
  Administered 2023-01-18 – 2023-01-19 (×2): 10 mg via ORAL
  Filled 2023-01-18 (×2): qty 2

## 2023-01-18 MED ORDER — CHLORHEXIDINE GLUCONATE 0.12 % MT SOLN
OROMUCOSAL | Status: AC
  Filled 2023-01-18: qty 15

## 2023-01-18 MED ORDER — KETAMINE HCL 50 MG/5ML IJ SOSY
PREFILLED_SYRINGE | INTRAMUSCULAR | Status: AC
  Filled 2023-01-18: qty 5

## 2023-01-18 MED ORDER — KETAMINE HCL 10 MG/ML IJ SOLN
INTRAMUSCULAR | Status: DC | PRN
  Administered 2023-01-18: 20 mg via INTRAVENOUS
  Administered 2023-01-18: 30 mg via INTRAVENOUS

## 2023-01-18 MED ORDER — LACTATED RINGERS IV SOLN: INTRAVENOUS | Status: DC

## 2023-01-18 MED ORDER — MELATONIN 5 MG PO TABS: 2.5000 mg | ORAL_TABLET | Freq: Every evening | ORAL | Status: DC | PRN

## 2023-01-18 SURGICAL SUPPLY — 64 items
ADH SKN CLS APL DERMABOND .7 (GAUZE/BANDAGES/DRESSINGS) ×2
APL PRP STRL LF DISP 70% ISPRP (MISCELLANEOUS) ×1
APPLIER CLIP 9.375 SM OPEN (CLIP) ×2
APR CLP SM 9.3 20 MLT OPN (CLIP) ×2
BINDER BREAST XXLRG (GAUZE/BANDAGES/DRESSINGS) IMPLANT
BIOPATCH WHT 1IN DISK W/4.0 H (GAUZE/BANDAGES/DRESSINGS) ×2 IMPLANT
BLADE BOVIE TIP EXT 4 (BLADE) ×1 IMPLANT
BLADE SURG 15 STRL LF DISP TIS (BLADE) ×2 IMPLANT
BLADE SURG 15 STRL SS (BLADE) ×2
BULB RESERV EVAC DRAIN JP 100C (MISCELLANEOUS) ×2 IMPLANT
CHLORAPREP W/TINT 26 (MISCELLANEOUS) ×1 IMPLANT
CLIP APPLIE 9.375 SM OPEN (CLIP) ×1 IMPLANT
CNTNR URN SCR LID CUP LEK RST (MISCELLANEOUS) ×4 IMPLANT
CONT SPEC 4OZ STRL OR WHT (MISCELLANEOUS) ×2
COVER PROBE GAMMA FINDER SLV (MISCELLANEOUS) ×1 IMPLANT
DERMABOND ADVANCED .7 DNX12 (GAUZE/BANDAGES/DRESSINGS) ×2 IMPLANT
DRAIN CHANNEL JP 19F (MISCELLANEOUS) ×2 IMPLANT
DRAPE LAPAROTOMY TRNSV 106X77 (MISCELLANEOUS) ×1 IMPLANT
DRSG GAUZE FLUFF 36X18 (GAUZE/BANDAGES/DRESSINGS) ×1 IMPLANT
DRSG MEPILEX FLEX 6X6 (GAUZE/BANDAGES/DRESSINGS) ×2 IMPLANT
DRSG TEGADERM 2-3/8X2-3/4 SM (GAUZE/BANDAGES/DRESSINGS) ×2 IMPLANT
ELECT CAUTERY BLADE 6.4 (BLADE) ×2 IMPLANT
ELECT CAUTERY BLADE TIP 2.5 (TIP) ×1
ELECT REM PT RETURN 9FT ADLT (ELECTROSURGICAL) ×2
ELECTRODE CAUTERY BLDE TIP 2.5 (TIP) ×1 IMPLANT
ELECTRODE REM PT RTRN 9FT ADLT (ELECTROSURGICAL) ×1 IMPLANT
GLOVE BIO SURGEON STRL SZ 6.5 (GLOVE) ×4 IMPLANT
GLOVE BIO SURGEON STRL SZ7 (GLOVE) ×1 IMPLANT
GOWN STRL REUS W/ TWL LRG LVL3 (GOWN DISPOSABLE) ×2 IMPLANT
GOWN STRL REUS W/TWL LRG LVL3 (GOWN DISPOSABLE) ×8
HEMOSTAT ARISTA ABSORB 3G PWDR (HEMOSTASIS) IMPLANT
IMPL EXPANDER BREAST 535CC (Breast) IMPLANT
IMPLANT EXPANDER BREAST 535CC (Breast) ×2 IMPLANT
IV NS 1000ML (IV SOLUTION) ×1
IV NS 1000ML BAXH (IV SOLUTION) IMPLANT
KIT FILL ASEPTIC TRANSFER (MISCELLANEOUS) ×1 IMPLANT
KIT TURNOVER KIT A (KITS) ×1 IMPLANT
LABEL OR SOLS (LABEL) ×1 IMPLANT
MANIFOLD NEPTUNE II (INSTRUMENTS) ×1 IMPLANT
NDL FILTER BLUNT 18X1 1/2 (NEEDLE) ×1 IMPLANT
NDL HYPO 22X1.5 SAFETY MO (MISCELLANEOUS) ×2 IMPLANT
NEEDLE FILTER BLUNT 18X1 1/2 (NEEDLE) ×1 IMPLANT
NEEDLE HYPO 22X1.5 SAFETY MO (MISCELLANEOUS) ×3 IMPLANT
PACK BASIN MAJOR ARMC (MISCELLANEOUS) ×2 IMPLANT
PAD ABD DERMACEA PRESS 5X9 (GAUZE/BANDAGES/DRESSINGS) ×4 IMPLANT
PIN SAFETY STRL (MISCELLANEOUS) ×1 IMPLANT
SPONGE T-LAP 18X18 ~~LOC~~+RFID (SPONGE) ×7 IMPLANT
STRIP CLOSURE SKIN 1/2X4 (GAUZE/BANDAGES/DRESSINGS) ×2 IMPLANT
SUT ETH BLK MONO 3 0 FS 1 12/B (SUTURE) IMPLANT
SUT MNCRL 3-0 UNDYED SH (SUTURE) ×2 IMPLANT
SUT MNCRL 4-0 (SUTURE) ×5
SUT MNCRL 4-0 27XMFL (SUTURE) ×5
SUT MNCRL+ 5-0 UNDYED PC-3 (SUTURE) IMPLANT
SUT PDS II 3-0 (SUTURE) IMPLANT
SUT PDS PLUS AB 2-0 CT-1 (SUTURE) ×6 IMPLANT
SUT SILK 4 0 SH (SUTURE) ×2 IMPLANT
SUT VIC AB 3-0 SH 27 (SUTURE) ×2
SUT VIC AB 3-0 SH 27X BRD (SUTURE) IMPLANT
SUTURE MNCRL 4-0 27XMF (SUTURE) ×2 IMPLANT
SYR 10ML LL (SYRINGE) ×3 IMPLANT
SYR 20ML LL LF (SYRINGE) IMPLANT
SYR BULB IRRIG 60ML STRL (SYRINGE) ×1 IMPLANT
TISSUE FLEXHD PERF PLIAB 8X16 (Tissue) IMPLANT
TRAP FLUID SMOKE EVACUATOR (MISCELLANEOUS) ×1 IMPLANT

## 2023-01-18 NOTE — Anesthesia Preprocedure Evaluation (Addendum)
Anesthesia Evaluation  Patient identified by MRN, date of birth, ID band Patient awake    Reviewed: Allergy & Precautions, H&P , NPO status , Patient's Chart, lab work & pertinent test results  History of Anesthesia Complications (+) PONV and history of anesthetic complications  Airway Mallampati: III  TM Distance: >3 FB Neck ROM: full    Dental no notable dental hx.    Pulmonary neg pulmonary ROS   Pulmonary exam normal        Cardiovascular hypertension, Normal cardiovascular exam     Neuro/Psych  Headaches PSYCHIATRIC DISORDERS Anxiety Depression    Spinal cord stroke 2021  Neuromuscular disease (s/p lumbar fusion, Right leg weakness)    GI/Hepatic negative GI ROS, Neg liver ROS,,,  Endo/Other    Morbid obesity  Renal/GU      Musculoskeletal   Abdominal  (+) + obese  Peds  Hematology negative hematology ROS (+)   Anesthesia Other Findings Past Medical History: No date: Anemia No date: Anxiety No date: Cervical cancer (HCC) No date: Family history of adverse reaction to anesthesia     Comment:   " my mother takes a long time time wake up" No date: GERD (gastroesophageal reflux disease) 01/18/2018: History of dysplastic nevus     Comment:  right shoulder, recurrent dysplastic nevus No date: History of kidney stones No date: History of shingles No date: Hypertension No date: Migraine No date: Multiple allergies No date: Obesity No date: Osteoarthritis     Comment:  left hip No date: Pneumonia No date: PONV (postoperative nausea and vomiting)     Comment:  slow to wake up No date: Sleep apnea     Comment:  does not wear CPAP 08/11/2020: Stroke (HCC)     Comment:  spinal cord stroke No date: Wears glasses  Past Surgical History: No date: ABDOMINAL HYSTERECTOMY No date: APPENDECTOMY No date: BACK SURGERY No date: BILIOPANCREATIC DIVISION W DUODENAL SWITCH; N/A 12/06/2022: BREAST BIOPSY; Left      Comment:  u/s bx,1:00 heart clip, path pending 12/06/2022: BREAST BIOPSY; Left     Comment:  [name] bx 2:00 ribbon clip path pending 12/06/2022: BREAST BIOPSY; Left     Comment:  [name] LT BREAST BX W LOC DEV EA ADD LESION IMG BX SPEC [name]               GUIDE 12/06/2022 [facility] 12/06/2022: BREAST BIOPSY; Left     Comment:  [name] LT BREAST BX W LOC DEV 1ST LESION IMG BX SPEC [name]               GUIDE 12/06/2022 [facility] No date: CHOLECYSTECTOMY 06/27/2021: COLONOSCOPY; N/A     Comment:  Procedure: COLONOSCOPY;  Surgeon: Jaynie Collins,              DO;  Location: [facility] ENDOSCOPY;  Service:               Gastroenterology;  Laterality: N/A; No date: DIAGNOSTIC LAPAROSCOPY     Comment:  LOA 06/27/2021: ESOPHAGOGASTRODUODENOSCOPY; N/A     Comment:  Procedure: ESOPHAGOGASTRODUODENOSCOPY (EGD);  Surgeon:               Jaynie Collins, DO;  Location: [facility] ENDOSCOPY;                Service: Gastroenterology;  Laterality: N/A; 07/25/2017: ESOPHAGOGASTRODUODENOSCOPY (EGD) WITH PROPOFOL; N/A     Comment:  Procedure: ESOPHAGOGASTRODUODENOSCOPY (EGD) WITH  PROPOFOL;  Surgeon: Scot Jun, MD;  Location:               [facility] ENDOSCOPY;  Service: Endoscopy;  Laterality: N/A; No date: KNEE ARTHROSCOPY No date: LAPAROSCOPIC GASTRIC SLEEVE RESECTION WITH HIATAL HERNIA  REPAIR 08/29/2022: LUMBAR FUSION     Comment:  Revision Lumbar two through five Laminectomy & Fusion               with Transforaminal Lumbar interbody fusion No date: TONSILLECTOMY 01/26/2016: TOTAL HIP ARTHROPLASTY; Left     Comment:  Procedure: LEFT TOTAL HIP ARTHROPLASTY ANTERIOR               APPROACH;  Surgeon: Tarry Kos, MD;  Location: [facility] OR;                Service: Orthopedics;  Laterality: Left; 08/2019: TRANSFORAMINAL LUMBAR INTERBODY FUSION (TLIF) WITH PEDICLE  SCREW FIXATION 3 LEVEL     Comment:  Vira Browns, MD L2-L5     Reproductive/Obstetrics negative OB ROS                              Anesthesia Physical Anesthesia Plan  ASA: 3  Anesthesia Plan: General ETT   Post-op Pain Management: Ofirmev IV (intra-op)* and Toradol IV (intra-op)*   Induction: Intravenous  PONV Risk Score and Plan: 4 or greater and Ondansetron, Dexamethasone, Midazolam and Propofol infusion  Airway Management Planned: Oral ETT  Additional Equipment:   Intra-op Plan:   Post-operative Plan: Extubation in OR  Informed Consent: I have reviewed the patients History and Physical, chart, labs and discussed the procedure including the risks, benefits and alternatives for the proposed anesthesia with the patient or authorized representative who has indicated his/her understanding and acceptance.     Dental Advisory Given  Plan Discussed with: CRNA and Surgeon  Anesthesia Plan Comments:        Anesthesia Quick Evaluation

## 2023-01-18 NOTE — Interval H&P Note (Signed)
History and Physical Interval Note:  01/18/2023 8:51 AM  Vanessa Medina  has presented today for surgery, with the diagnosis of Left breast cancer C50.412.  The various methods of treatment have been discussed with the patient and family. After consideration of risks, benefits and other options for treatment, the patient has consented to  Procedure(s): MASTECTOMY WITH SENTINEL LYMPH NODE BIOPSY, RNFA to assist (Bilateral) IMMEDIATE LEFT BREAST RECONSTRUCTION WITH PLACEMENT OF TISSUE EXPANDER AND FLEX HD (ACELLULAR HYDRATED DERMIS) (Left) as a surgical intervention.  The patient's history has been reviewed, patient examined, no change in status, stable for surgery.  I have reviewed the patient's chart and labs.  Questions were answered to the patient's satisfaction.     Alena Bills Latrell Potempa

## 2023-01-18 NOTE — H&P (Signed)
Vanessa Medina is an 52 y.o. female.       Chief Complaint  Patient presents with   Follow-up        HPI: Vanessa Medina is a 52 y.o. female with a recently diagnosed mammary carcinoma ER/PR positive HER2 negative Menarche at age 27, grandmother on her 4 sister with history of breast cancer.  Dad with history of bladder cancer.  Prior breast biopsies abnormal mammogram in 2022. Hysterectomy in 2009. G1P1. SHe Did have a recent mammogram that I personally reviewed showing evidence of a left upper outer quadrant mass.  This was followed by an ultrasound showing 3 masses that are very anterior and close to the skin from 1:00 all the way to the 3:00.  The span of 3.7 cm. Serous of masses 1 and 2 showed mammary carcinoma ER/PR positive HER2 negative. SHe had her second lumbar fusion with cage about 3 months ago and is recovering.  She denies any breast concerns no masses no breast discharge she only saw some skin dryness on the left side.  He has now healed from the recent breast biopsy. He underwent genetic testing and was no found to have an identified abnormality.  She also had an MRI that have personally reviewed.  There are 3 masses on the left side and one that is retroareolar and close the skin.  The 3 masses encompassing the area of 4 cm..  On the right side there is no evidence of abnormalities. She has seen plastic surgery Dr. Ulice Bold and she wishes to pursue reconstruction       Past Medical History:  Diagnosis Date   Anemia     Cervical cancer     Family history of adverse reaction to anesthesia       " my mother takes a long time time wake up"   GERD (gastroesophageal reflux disease)     Headache      migraine   History of dysplastic nevus 01/18/2018    right shoulder, recurrent dysplastic nevus   History of shingles     Hypertension     Migraine     Multiple allergies     Obesity     Osteoarthritis      left hip   PONV (postoperative nausea and vomiting)      Sleep apnea      does not wear CPAP   Stroke 08/11/2020   Wears glasses             Past Surgical History:  Procedure Laterality Date   ABDOMINAL HYSTERECTOMY       APPENDECTOMY       BACK SURGERY       BILIOPANCREATIC DIVERSION        with duodenal switch laparoscopic    BREAST BIOPSY Left 12/06/2022    u/s bx,1:00 heart clip, path pending   BREAST BIOPSY Left 12/06/2022    [name] bx 2:00 ribbon clip path pending   BREAST BIOPSY Left 12/06/2022    [name] LT BREAST BX W LOC DEV EA ADD LESION IMG BX SPEC US GUIDE 12/06/2022 [facility]   BREAST BIOPSY Left 12/06/2022    [name] LT BREAST BX W LOC DEV 1ST LESION IMG BX SPEC US GUIDE 12/06/2022 [facility]   CHOLECYSTECTOMY       COLONOSCOPY N/A 06/27/2021    Procedure: COLONOSCOPY;  Surgeon: Jaynie Collins, DO;  Location: [facility] ENDOSCOPY;  Service: Gastroenterology;  Laterality: N/A;   DIAGNOSTIC LAPAROSCOPY  LOA   ESOPHAGOGASTRODUODENOSCOPY N/A 06/27/2021    Procedure: ESOPHAGOGASTRODUODENOSCOPY (EGD);  Surgeon: Jaynie Collins, DO;  Location: [facility] ENDOSCOPY;  Service: Gastroenterology;  Laterality: N/A;   ESOPHAGOGASTRODUODENOSCOPY (EGD) WITH PROPOFOL N/A 07/25/2017    Procedure: ESOPHAGOGASTRODUODENOSCOPY (EGD) WITH PROPOFOL;  Surgeon: Scot Jun, MD;  Location: [facility] ENDOSCOPY;  Service: Endoscopy;  Laterality: N/A;   KNEE ARTHROSCOPY       LAPAROSCOPIC GASTRIC SLEEVE RESECTION WITH HIATAL HERNIA REPAIR       LUMBAR FUSION   08/29/2022    Revision Lumbar two through five Laminectomy & Fusion with Transforaminal Lumbar interbody fusion   TONSILLECTOMY       TOTAL HIP ARTHROPLASTY Left 01/26/2016    Procedure: LEFT TOTAL HIP ARTHROPLASTY ANTERIOR APPROACH;  Surgeon: Tarry Kos, MD;  Location: [facility] OR;  Service: Orthopedics;  Laterality: Left;   TRANSFORAMINAL LUMBAR INTERBODY FUSION (TLIF) WITH PEDICLE SCREW FIXATION 3 LEVEL   08/2019    Vira Browns, MD L2-L5           Family History  Problem Relation Age of  Onset   Renal Disease Mother     Hypertension Mother     Sudden Cardiac Death Mother     Heart failure Mother     Valvular heart disease Mother     Heart disease Mother     Breast cancer Maternal Grandmother     Stroke Brother     Heart attack Brother 72   Diabetes Other        Social History:  reports that she has never smoked. She has never been exposed to tobacco smoke. She has never used smokeless tobacco. She reports current alcohol use. She reports that she does not use drugs.   Allergies:       Allergies  Allergen Reactions   Bee Venom Swelling   Latex Hives and Rash   Meperidine Hcl Other (See Comments)   Morphine Other (See Comments)      BRADYCARDIA   Shellfish Allergy Anaphylaxis   Codeine Nausea And Vomiting   Meperidine Hcl Other (See Comments)      BRADYCARDIA   Oxycodone-Acetaminophen Hives   Acetaminophen Itching   Bee Pollen Other (See Comments)      Unknown   Iodinated Contrast Media Other (See Comments)      Swelling   Other reaction(s): Other (See Comments)  Swelling   Swelling   Oxycodone Hives and Itching      Blisters on back   Pentazocine Nausea Only and Nausea And Vomiting   Oxycodone-Acetaminophen Itching   Pentazocine Lactate Nausea And Vomiting      REACTION: vomiting with Talwin NX   Propoxyphene Nausea Only and Nausea And Vomiting   Propoxyphene N-Acetaminophen Nausea And Vomiting      Medications reviewed.         ROS Full ROS performed and is otherwise negative other than what is stated in the HPI   Physical Exam CONSTITUTIONAL: NAD EYES: Pupils are equal, round, Sclera are non-icteric. EARS, NOSE, MOUTH AND THROAT: The oropharynx is clear. The oral mucosa is pink and moist. Hearing is intact to voice. LYMPH NODES:  Lymph nodes in the neck are normal.RESPIRATORY:  Lungs are clear. There is normal respiratory effort, with equal breath sounds bilaterally, and without pathologic use of accessory muscles. CARDIOVASCULAR: Heart  is regular without murmurs, gallops, or rubs. GI: The abdomen is  soft, nontender, and nondistended. There are no palpable masses. There is no hepatosplenomegaly. There are normal bowel  sounds in all quadrants. GU: Rectal deferred.   MUSCULOSKELETAL: Normal muscle strength and tone. No cyanosis or edema.   SKIN: Turgor is good and there are no pathologic skin lesions or ulcers. NEUROLOGIC: Motor and sensation is grossly normal. Cranial nerves are grossly intact. PSYCH:  Oriented to person, place and time. Affect is normal.     Assessment/Plan: 1. Malignant neoplasm of upper-outer quadrant of left breast in female, estrogen receptor positive. She wishes to proceed with bilateral mastectomies and SLNBx on the left. Procedure discussed with the patient in detail.  Risks, benefits and possible complications including but not limited to: Bleeding, infection, margin positivity, need for radiation, chronic pain, lymphedema.  She understands and wished to proceed.      Sterling Big, MD [facility] General Surgeon

## 2023-01-18 NOTE — Op Note (Signed)
Op report    DATE OF OPERATION:  01/18/2023  LOCATION: [facility]  SURGICAL DIVISION: Plastic Surgery  PREOPERATIVE DIAGNOSES:  1. Left Breast cancer.    POSTOPERATIVE DIAGNOSES:  1. Left Breast cancer.   PROCEDURE:  Bilateral immediate breast reconstruction with placement of Acellular Dermal Matrix and tissue expanders.  Closure of the axillary incision 4 cm  SURGEON: Foster Simpson, DO  ASSISTANT: Burna Forts, PA  ANESTHESIA:  General.   COMPLICATIONS: None.   IMPLANTS: Left - Mentor 535 cc. Ref #[id number].  Serial Number [id number], 250 cc of injectable saline placed in the expander. Right - Mentor 535 cc. Ref #[id number].  Serial Number [id number], 250 cc of injectable saline placed in the expander. Acellular Dermal Matrix 8 x 16 cm Flex HD x 2  INDICATIONS FOR PROCEDURE:  The patient, Vanessa Medina, is a 52 y.o. female born on 12/06/1970, is here for  immediate first stage breast reconstruction with placement of bilateral tissue expander and Acellular dermal matrix. MRN: [id number]  CONSENT:  Informed consent was obtained directly from the patient. Risks, benefits and alternatives were fully discussed. Specific risks including but not limited to bleeding, infection, hematoma, seroma, scarring, pain, implant infection, implant extrusion, capsular contracture, asymmetry, wound healing problems, and need for further surgery were all discussed. The patient did have an ample opportunity to have her questions answered to her satisfaction.   DESCRIPTION OF PROCEDURE:  The patient was taken to the operating room by the general surgery team. SCDs were placed and IV antibiotics were given. The patient's chest was prepped and draped in a sterile fashion. A time out was performed and the implants to be used were identified.  Bilateral mastectomies were performed.  Once the general surgery team had completed their portion of the case the patient was rendered to  the plastic and reconstructive surgery team.  Left:  The pectoralis major muscle was lifted from the chest wall with release of the lateral edge and lateral inframammary fold.  The pocket was irrigated with antibiotic solution and hemostasis was achieved with electrocautery.  The ADM was then prepared according to the manufacture guidelines and slits placed to help with postoperative fluid management.  The ADM was then sutured to the inferior and lateral edge of the inframammary fold with 2-0 PDS starting with an interrupted stitch and then a running stitch.  The lateral portion was sutured to with interrupted sutures after the expander was placed.  The expander was prepared according to the manufacture guidelines, the air evacuated and then it was placed under the ADM and pectoralis major muscle.  The inferior and lateral tabs were used to secure the expander to the chest wall with 2-0 PDS.  The drain was placed at the inframammary fold over the ADM and secured to the skin with 3-0 Silk.  Experel and Arista were placed in the pocket.  The deep layers were closed with 3-0 PDS followed by 3-0 Monocryl.  The skin was closed with 4-0 Monocryl.  The skin was closed with 5-0 Monocryl and then dermabond was applied.  The ABDs and breast binder were placed.  The patient tolerated the procedure well and there were no complications.  The patient was allowed to wake from anesthesia and taken to the recovery room in satisfactory condition.   Right:  The pectoralis major muscle was lifted from the chest wall with release of the lateral edge and lateral inframammary fold.  The pocket was irrigated with antibiotic solution and hemostasis was  achieved with electrocautery.  The ADM was then prepared according to the manufacture guidelines and slits placed to help with postoperative fluid management.  The ADM was then sutured to the inferior and lateral edge of the inframammary fold with 2-0 PDS starting with an interrupted  stitch and then a running stitch.  The lateral portion was sutured to with interrupted sutures after the expander was placed.  The expander was prepared according to the manufacture guidelines, the air evacuated and then it was placed under the ADM and pectoralis major muscle.  The inferior and lateral tabs were used to secure the expander to the chest wall with 2-0 PDS.  The drain was placed at the inframammary fold over the ADM and secured to the skin with 3-0 Silk.  Experel and Arista were place in the pocket.  The deep layers were closed with 3-0 PDS followed by 4-0 Monocryl.  The skin was closed with 5-0 Monocryl and then dermabond was applied.  The ABDs and breast binder were placed.  The patient tolerated the procedure well and there were no complications.  The patient was allowed to wake from anesthesia and taken to the recovery room in satisfactory condition.   The advanced practice practitioner (APP) assisted throughout the case.  The APP was essential in retraction and counter traction when needed to make the case progress smoothly.  This retraction and assistance made it possible to see the tissue plans for the procedure.  The assistance was needed for blood control, tissue re-approximation and assisted with closure of the incision site.

## 2023-01-18 NOTE — Transfer of Care (Signed)
Immediate Anesthesia Transfer of Care Note  Patient: Vanessa Medina  Procedure(s) Performed: MASTECTOMY WITH SENTINEL LYMPH NODE BIOPSY, RNFA to assist (Bilateral: Breast) IMMEDIATE LEFT BREAST RECONSTRUCTION WITH PLACEMENT OF TISSUE EXPANDER AND FLEX HD (ACELLULAR HYDRATED DERMIS) (Bilateral: Breast)  Patient Location: PACU  Anesthesia Type:General  Level of Consciousness: drowsy and patient cooperative  Airway & Oxygen Therapy: Patient Spontanous Breathing and Patient connected to face mask oxygen  Post-op Assessment: Report given to RN and Post -op Vital signs reviewed and stable  Post vital signs: Reviewed and stable  Last Vitals:  Vitals Value Taken Time  BP 149/81 01/18/23 1322  Temp 36.3 C 01/18/23 1325  Pulse 66 01/18/23 1326  Resp 16 01/18/23 1326  SpO2 100 % 01/18/23 1326  Vitals shown include unvalidated device data.  Last Pain:  Vitals:   01/18/23 0847  TempSrc: Tympanic  PainSc: 0-No pain         Complications: No notable events documented.

## 2023-01-18 NOTE — Op Note (Signed)
Pre-operative Diagnosis: left invasive Breast Cancer    Post-operative Diagnosis: Same   Surgeon: Sterling Big,  MD FACS  Anesthesia: GETA  Procedure:bilateral simple mastectomies, sentinel node biopsy left axilla using lymphozurin and sulfur colloid Immediate bilateral reconstruction ( plastic surgery please see separate Op report)   Findings: Two hot and blue nodes Left axilla  Estimated Blood Loss: 40 cc for my portion         Drains: per plastics          Specimens: bilateral mastectomies w labels, Sentinel nodes x 2        Complications: none                 Condition: Stable   Operation performed with curative intent:Yes   Tracer(s) used to identify sentinel nodes in the upfront surgery (non-neoadjuvant) setting (select all that apply):Dye and Radioactive Tracer   Tracer(s) used to identify sentinel nodes in the neoadjuvant setting (select all that apply):N/A   All nodes (colored or non-colored) present at the end of a dye-filled lymphatic channel were removed:Yes    All significantly radioactive nodes were removed:Yes   All palpable suspicious nodes were removed:Yes   Biopsy-proven positive nodes marked with clips prior to chemotherapy were identified and removed:N/A  Procedure Details  The patient was seen again in the Holding Room. The benefits, complications, treatment options, and expected outcomes were discussed with the patient. The risks of bleeding, infection, recurrence of symptoms, failure to resolve symptoms, hematoma, seroma, open wound, cosmetic deformity, and the need for further surgery were discussed.  The patient was taken to Operating Room, identified as Vanessa Medina and the procedure verified.  A Time Out was held and the above information confirmed.  Prior to the induction of general anesthesia, antibiotic prophylaxis was administered. VTE prophylaxis was in place. Appropriate anesthesia was then administered and tolerated well.  Isosulfan  blue dye was injected in the periareolar area early under aseptic conditions. The chest was prepped with Chloraprep and draped in the sterile fashion. The patient was positioned in the supine position.  We started with left  axillary node biopsy,  Using the hand-held probe an area of high counts was identified in the axilla, an incision was made and direction by the probe aided in dissection of a lymph node . I clipped the small lymphatic channels in the standard fashion. A total of two nodes that were both hot and blue were excised.   Attention was turned to the left breast where an elliptical incision incorporating Nipple areolar complex was performed ( skin sparing incision leaving most of the breast envelope). Circumferential skin flaps were performed with electrocautery being very careful to not injure the dermis. Superior margin of dissection was clavicle, laterally was latissimus dorsi, inferiorly the mammary fold and medial the sternum. The remaining of the breast was removed from the chest wall with electrocautery, excellent hemostasis was achieved with cautery. Specimen was marked appropriately  I did turn the left chest wall defect to Dr. Ulice Bold so she could proceed with reconstruction.  Attention was turned to the Right breast where an elliptical incision incorporating Nipple areolar complex was performed ( skin sparing incision leaving most of the breast envelope). Circumferential skin flaps were performed with electrocautery being very careful to not injure the dermis. Superior margin of dissection was clavicle, laterally was latissimus dorsi, inferiorly the mammary fold and medial the sternum. The remaining of the breast was removed from the chest wall with electrocautery, excellent hemostasis  was achieved with cautery. Specimen was marked appropriately for pathology.  I did turn the Right chest wall defect to Dr. Ulice Bold so she could proceed with reconstruction.      Sterling Big, MD, FACS

## 2023-01-18 NOTE — Discharge Instructions (Signed)
INSTRUCTIONS FOR AFTER BREAST SURGERY   You will likely have some questions about what to expect following your operation.  The following information will help you and your family understand what to expect when you are discharged from the hospital.  It is important to follow these guidelines to help ensure a smooth recovery and reduce complication.  Postoperative instructions include information on: diet, wound care, medications and physical activity.  AFTER SURGERY Expect to go home after the procedure.  In some cases, you may need to spend one night in the hospital for observation.  DIET Breast surgery does not require a specific diet.  However, the healthier you eat the better your body will heal. It is important to increasing your protein intake.  This means limiting the foods with sugar and carbohydrates.  Focus on vegetables and some meat.  If you have liposuction during your procedure be sure to drink water.  If your urine is bright yellow, then it is concentrated, and you need to drink more water.  As a general rule after surgery, you should have 8 ounces of water every hour while awake.  If you find you are persistently nauseated or unable to take in liquids let us know.  NO TOBACCO USE or EXPOSURE.  This will slow your healing process and lead to a wound.  WOUND CARE Leave the binder on for 3 days . Use fragrance free soap like Dial, Dove or Ivory.   After 3 days you can remove the binder to shower. Once dry apply binder or sports bra. If you have liposuction you will have a soft and spongy dressing (Lipofoam) that helps prevent creases in your skin.  Remove before you shower and then replace it.  It is also available on Amazon. If you have steri-strips / tape directly attached to your skin leave them in place. It is OK to get these wet.   No baths, pools or hot tubs for four weeks. We close your incision to leave the smallest and best-looking scar. No ointment or creams on your incisions  for four weeks.  No Neosporin (Too many skin reactions).  A few weeks after surgery you can use Mederma and start massaging the scar. We ask you to wear your binder or sports bra for the first 6 weeks around the clock, including while sleeping. This provides added comfort and helps reduce the fluid accumulation at the surgery site. NO Ice or heating pads to the operative site.  You have a very high risk of a BURN before you feel the temperature change.  ACTIVITY No heavy lifting until cleared by the doctor.  This usually means no more than a half-gallon of milk.  It is OK to walk and climb stairs. Moving your legs is very important to decrease your risk of a blood clot.  It will also help keep you from getting deconditioned.  Every 1 to 2 hours get up and walk for 5 minutes. This will help with a quicker recovery back to normal.  Let pain be your guide so you don't do too much.  This time is for you to recover.  You will be more comfortable if you sleep and rest with your head elevated either with a few pillows under you or in a recliner.  No stomach sleeping for a three months.  WORK Everyone returns to work at different times. As a rough guide, most people take at least 1 - 2 weeks off prior to returning to work. If   you need documentation for your job, give the forms to the front staff at the clinic.  DRIVING Arrange for someone to bring you home from the hospital after your surgery.  You may be able to drive a few days after surgery but not while taking any narcotics or valium.  BOWEL MOVEMENTS Constipation can occur after anesthesia and while taking pain medication.  It is important to stay ahead for your comfort.  We recommend taking Milk of Magnesia (2 tablespoons; twice a day) while taking the pain pills.  MEDICATIONS You may be prescribed should start after surgery At your preoperative visit for you history and physical you may have been given the following medications: An antibiotic: Start  this medication when you get home and take according to the instructions on the bottle. Zofran 4 mg:  This is to treat nausea and vomiting.  You can take this every 6 hours as needed and only if needed. Valium 2 mg for breast cancer patients: This is for muscle tightness if you have an implant or expander. This will help relax your muscle which also helps with pain control.  This can be taken every 12 hours as needed. Don't drive after taking this medication. Norco (hydrocodone/acetaminophen) 5/325 mg:  This is only to be used after you have taken the Motrin or the Tylenol. Every 8 hours as needed.   Over the counter Medication to take: Ibuprofen (Motrin) 600 mg:  Take this every 6 hours.  If you have additional pain then take 500 mg of the Tylenol every 8 hours.  Only take the Norco after you have tried these two. MiraLAX or Milk of Magnesia: Take this according to the bottle if you take the Norco.  WHEN TO CALL Call your surgeon's office if any of the following occur: Fever 101 degrees F or greater Excessive bleeding or fluid from the incision site. Pain that increases over time without aid from the medications Redness, warmth, or pus draining from incision sites Persistent nausea or inability to take in liquids Severe misshapen area that underwent the operation.  Here are some resources for breast cancer patients:  Plastic surgery website: https://www.plasticsurgery.org/for-medical-professionals/education-and-resources/publications/breast-reconstruction-magazine Breast Reconstruction Awareness Campaign:  http://www.breastreconusa.org/ Plastic surgery Implant information:  https://www.plasticsurgery.org/patient-safety/breast-implant-safety   

## 2023-01-18 NOTE — Anesthesia Procedure Notes (Signed)
Procedure Name: Intubation Date/Time: 01/18/2023 9:45 AM  Performed by: Jeannene Patella, CRNAPre-anesthesia Checklist: Patient identified, Emergency Drugs available, Suction available, Patient being monitored and Timeout performed Patient Re-evaluated:Patient Re-evaluated prior to induction Oxygen Delivery Method: Circle system utilized Preoxygenation: Pre-oxygenation with 100% oxygen Induction Type: IV induction Ventilation: Mask ventilation without difficulty Laryngoscope Size: McGraph and 4 Grade View: Grade I Tube type: Oral Tube size: 6.5 mm Number of attempts: 1 Airway Equipment and Method: Stylet and Video-laryngoscopy Placement Confirmation: ETT inserted through vocal cords under direct vision, positive ETCO2 and breath sounds checked- equal and bilateral Secured at: 20 (at lip) cm Tube secured with: Tape Dental Injury: Teeth and Oropharynx as per pre-operative assessment

## 2023-01-19 ENCOUNTER — Ambulatory Visit (INDEPENDENT_AMBULATORY_CARE_PROVIDER_SITE_OTHER): Payer: BLUE CROSS/BLUE SHIELD | Admitting: Surgical

## 2023-01-19 ENCOUNTER — Encounter: Payer: Self-pay | Admitting: Surgery

## 2023-01-19 ENCOUNTER — Encounter: Payer: Medicaid Other | Admitting: Physical Medicine and Rehabilitation

## 2023-01-19 DIAGNOSIS — Z170 Estrogen receptor positive status [ER+]: Secondary | ICD-10-CM

## 2023-01-19 DIAGNOSIS — C50812 Malignant neoplasm of overlapping sites of left female breast: Secondary | ICD-10-CM

## 2023-01-19 DIAGNOSIS — C50412 Malignant neoplasm of upper-outer quadrant of left female breast: Secondary | ICD-10-CM | POA: Diagnosis not present

## 2023-01-19 LAB — BASIC METABOLIC PANEL
Anion gap: 6 (ref 5–15)
BUN: 18 mg/dL (ref 6–20)
CO2: 22 mmol/L (ref 22–32)
Calcium: 8.6 mg/dL — ABNORMAL LOW (ref 8.9–10.3)
Chloride: 114 mmol/L — ABNORMAL HIGH (ref 98–111)
Creatinine, Ser: 1.01 mg/dL — ABNORMAL HIGH (ref 0.44–1.00)
GFR, Estimated: >60 mL/min (ref >60)
Glucose, Bld: 97 mg/dL (ref 70–99)
Potassium: 3.8 mmol/L (ref 3.5–5.1)
Sodium: 142 mmol/L (ref 135–145)

## 2023-01-19 LAB — CBC
HCT: 32.6 % — ABNORMAL LOW (ref 36.0–46.0)
Hemoglobin: 10.3 g/dL — ABNORMAL LOW (ref 12.0–15.0)
MCH: 31.2 pg (ref 26.0–34.0)
MCHC: 31.6 g/dL (ref 30.0–36.0)
MCV: 98.8 fL (ref 80.0–100.0)
Platelets: 246 10*3/uL (ref 150–400)
RBC: 3.3 MIL/uL — ABNORMAL LOW (ref 3.87–5.11)
RDW: 13.3 % (ref 11.5–15.5)
WBC: 6.8 10*3/uL (ref 4.0–10.5)
nRBC: 0 % (ref 0.0–0.2)

## 2023-01-19 LAB — HIV ANTIBODY (ROUTINE TESTING W REFLEX): HIV Screen 4th Generation wRfx: NONREACTIVE

## 2023-01-19 NOTE — Discharge Summary (Signed)
Patient ID: Vanessa Medina MRN: [id number] DOB/AGE: 1970/11/03 52 y.o.  Admit date: 01/18/2023 Discharge date: 01/19/2023   Discharge Diagnoses:  Principal Problem:   S/P mastectomy, bilateral   Procedures:Bilateral mastectomies with SLNBx on the left and reconstruction  Hospital Course:  52 yo  underwent .  Patient was kept overnight.  The time of discharge the patient was ambulating,  pain was controlled.  Her vital signs were stable and she was afebrile.   physical exam at discharge showed a pt  in no acute distress.   Awake and alert.  Chest: Dressing in place, mastectomy sites clean, no expanding hematoma, both drains w serosanguinous output)  Extremities well-perfused and no edema.  Condition of the patient the time of discharge was stable  Disposition: Discharge disposition: 01-Home or Self Care       Discharge Instructions     Call MD for:  difficulty breathing, headache or visual disturbances   Complete by: As directed    Call MD for:  extreme fatigue   Complete by: As directed    Call MD for:  hives   Complete by: As directed    Call MD for:  persistant dizziness or light-headedness   Complete by: As directed    Call MD for:  persistant nausea and vomiting   Complete by: As directed    Call MD for:  redness, tenderness, or signs of infection (pain, swelling, redness, odor or green/yellow discharge around incision site)   Complete by: As directed    Call MD for:  severe uncontrolled pain   Complete by: As directed    Call MD for:  temperature >100.4   Complete by: As directed    Diet - low sodium heart healthy   Complete by: As directed    Discharge instructions   Complete by: As directed    Please teach pt and family about JP care   Increase activity slowly   Complete by: As directed       Allergies as of 01/19/2023       Reactions   Bee Venom Swelling   Latex Hives, Rash   Meperidine Hcl Other (See Comments)   Morphine Other (See Comments)    BRADYCARDIA   Shellfish Allergy Anaphylaxis   Codeine Nausea And Vomiting   Meperidine Hcl Other (See Comments)   BRADYCARDIA   Oxycodone-acetaminophen Hives   Bee Pollen Other (See Comments)   Unknown   Iodinated Contrast Media Other (See Comments)   Swelling Other reaction(s): Other (See Comments)  Swelling   Swelling   Oxycodone Hives, Itching   Blisters on back   Pentazocine Nausea Only, Nausea And Vomiting   Oxycodone-acetaminophen Itching   Pentazocine Lactate Nausea And Vomiting   REACTION: vomiting with Talwin NX   Propoxyphene Nausea Only, Nausea And Vomiting   Propoxyphene N-acetaminophen Nausea And Vomiting        Medication List     TAKE these medications    aspirin EC 81 MG tablet Take 81 mg by mouth daily. Swallow whole.   azelastine 0.1 % nasal spray Commonly known as: ASTELIN Place into the nose.   baclofen 10 MG tablet Commonly known as: LIORESAL Take 1 tablet (10 mg total) by mouth 3 (three) times daily. - for spasms   cephALEXin 500 MG capsule Commonly known as: Keflex Take 1 capsule (500 mg total) by mouth 4 (four) times daily.   cyanocobalamin 1000 MCG tablet Commonly known as: VITAMIN B12 Take 1 tablet (1,000 mcg total) by  mouth daily.   diclofenac Sodium 1 % Gel Commonly known as: VOLTAREN APPLY 4 GRAMS TO AFFECTED  AREA(S) TOPICALLY 4 TIMES DAILY What changed: See the new instructions.   DULoxetine 60 MG capsule Commonly known as: CYMBALTA TAKE 1 CAPSULE BY MOUTH AT  NIGHT for nerve pain   EPINEPHrine 0.3 mg/0.3 mL Soaj injection Commonly known as: EPI-PEN SMARTSIG:0.3 Milliliter(s) IM Once PRN   fludrocortisone 0.1 MG tablet Commonly known as: FLORINEF Take 2 tablets (0.2 mg total) by mouth daily. For orthostatic hypotension-   fluticasone 50 MCG/ACT nasal spray Commonly known as: FLONASE Place 1-2 sprays into both nostrils daily.   gabapentin 300 MG capsule Commonly known as: NEURONTIN Take 1 capsule (300 mg total) by  mouth at bedtime. For nerve pain   HYDROcodone-acetaminophen 5-325 MG tablet Commonly known as: NORCO/VICODIN Take 1 tablet by mouth every 6 (six) hours as needed for moderate pain.   midodrine 10 MG tablet Commonly known as: PROAMATINE Take 1 tablet (10 mg total) by mouth 3 (three) times daily. For orthostatic hypotension   montelukast 10 MG tablet Commonly known as: SINGULAIR TAKE 1 TABLET BY MOUTH AT  BEDTIME FOR ALLERGIES   multivitamin with minerals Tabs tablet Take 1 tablet by mouth 2 (two) times daily.   Myrbetriq 25 MG Tb24 tablet Generic drug: mirabegron ER Take 25 mg by mouth daily.   pramipexole 0.125 MG tablet Commonly known as: MIRAPEX Take by mouth.   pyridOXINE 100 MG tablet Commonly known as: VITAMIN B6 Take 1 tablet (100 mg total) by mouth daily.   topiramate 100 MG tablet Commonly known as: TOPAMAX TAKE 1 TABLET BY MOUTH AT  BEDTIME   vitamin B-1 250 MG tablet Take 1 tablet (250 mg total) by mouth daily.        Follow-up Information     Dillingham, Alena Bills, DO Follow up in 10 day(s).   Specialty: Plastic Surgery Why: 01/23/23 10:15 AM Contact information: [address] [place] [address] [phone]         Leafy Ro, MD. Go in 2 week(s).   Specialty: General Surgery Why: 01/31/23 at [facility] information: [address] [place] [address] [phone]                  Sterling Big, MD FACS

## 2023-01-19 NOTE — Progress Notes (Signed)
Called to speak with patient to check in on her postop day 1 after immediate bilateral breast reconstruction with Dr. Ulice Bold yesterday after bilateral mastectomy by Dr. Everlene Farrier.  Patient did not answer, left voicemail please call our office if she has any questions or concerns.  We will plan to see her next week on 01/23/2023.  She is aware to call with questions or concerns as stated in HIPAA compliant voicemail.

## 2023-01-19 NOTE — TOC CM/SW Note (Signed)
Patient has orders to discharge home today. Chart reviewed. No TOC needs identified. CSW signing off.  Kambria Grima, CSW [phone]  

## 2023-01-19 NOTE — Anesthesia Postprocedure Evaluation (Signed)
Anesthesia Post Note  Patient: Vanessa Medina  Procedure(s) Performed: MASTECTOMY WITH SENTINEL LYMPH NODE BIOPSY, RNFA to assist (Bilateral: Breast) IMMEDIATE LEFT BREAST RECONSTRUCTION WITH PLACEMENT OF TISSUE EXPANDER AND FLEX HD (ACELLULAR HYDRATED DERMIS) (Bilateral: Breast)  Patient location during evaluation: PACU Anesthesia Type: General Level of consciousness: awake and alert Pain management: pain level controlled Vital Signs Assessment: post-procedure vital signs reviewed and stable Respiratory status: spontaneous breathing, nonlabored ventilation and respiratory function stable Cardiovascular status: blood pressure returned to baseline and stable Postop Assessment: no apparent nausea or vomiting Anesthetic complications: no   No notable events documented.   Last Vitals:  Vitals:   01/18/23 2259 01/19/23 0457  BP: 128/66 127/61  Pulse: 60 64  Resp: 16 16  Temp: (!) 36.4 C 36.6 C  SpO2: 96% 97%    Last Pain:  Vitals:   01/19/23 0000  TempSrc:   PainSc: Asleep                 Foye Deer

## 2023-01-20 ENCOUNTER — Other Ambulatory Visit: Payer: Self-pay

## 2023-01-20 DIAGNOSIS — Z853 Personal history of malignant neoplasm of breast: Secondary | ICD-10-CM | POA: Diagnosis not present

## 2023-01-20 DIAGNOSIS — T82838A Hemorrhage of vascular prosthetic devices, implants and grafts, initial encounter: Secondary | ICD-10-CM | POA: Insufficient documentation

## 2023-01-20 DIAGNOSIS — Y69 Unspecified misadventure during surgical and medical care: Secondary | ICD-10-CM | POA: Diagnosis not present

## 2023-01-20 DIAGNOSIS — J45909 Unspecified asthma, uncomplicated: Secondary | ICD-10-CM | POA: Diagnosis not present

## 2023-01-20 NOTE — ED Triage Notes (Signed)
Pt to ED from home for her drain tube is leaking. Pt had bilateral mastectomy Thursday this week. Pt is CAOx4 and in no acute distress. Pt surgery was here in our facility.

## 2023-01-21 ENCOUNTER — Emergency Department
Admission: EM | Admit: 2023-01-21 | Discharge: 2023-01-21 | Disposition: A | Discharge status: Home / Self Care | Payer: BLUE CROSS/BLUE SHIELD | Attending: Emergency Medicine | Admitting: Emergency Medicine

## 2023-01-21 DIAGNOSIS — T85838A Hemorrhage due to other internal prosthetic devices, implants and grafts, initial encounter: Secondary | ICD-10-CM

## 2023-01-21 NOTE — ED Provider Notes (Signed)
[facility] Provider Note    Event Date/Time   First MD Initiated Contact with Patient 01/21/23 0123     (approximate)   History   Chief Complaint Post-op Problem (JP drain leaking)   HPI  Vanessa Medina is a 52 y.o. female with past medical history of asthma, GERD, and breast cancer status post bilateral mastectomy who presents to the ED complaining of postop problem.  Patient underwent bilateral mastectomy with Dr. Everlene Farrier of general surgery and Dr. Ulice Bold of plastic surgery at [facility] 3 days ago.  Patient had a JP drain placed in each side following her procedure, states both drains had been functioning normally up until this evening.  Patient was woken from sleep with some drainage from around her left drain.  She reports bloody drainage on her clothing and is concerned that the drain may have become displaced.  She has ongoing discomfort following her procedure, but pain has been no worse since drainage started.  She does state that collection of fluid into the drain seems to have decreased tonight.  She has not noticed any purulent drainage and denies any fevers.     Physical Exam   Triage Vital Signs: ED Triage Vitals  Enc Vitals Group     BP 01/20/23 2355 (!) 175/94     Pulse Rate 01/20/23 2355 70     Resp 01/20/23 2355 16     Temp 01/20/23 2355 97.7 F (36.5 C)     Temp Source 01/20/23 2355 Oral     SpO2 01/20/23 2355 98 %     Weight 01/20/23 2356 253 lb 8.5 oz (115 kg)     Height 01/20/23 2356 5\' 4"  (1.626 m)     Head Circumference --      Peak Flow --      Pain Score 01/20/23 2356 8     Pain Loc --      Pain Edu? --      Excl. in GC? --     Most recent vital signs: Vitals:   01/21/23 0226 01/21/23 0227  BP: (!) 163/114   Pulse: 72   Resp: 20   Temp:    SpO2: 99% 99%    Constitutional: Alert and oriented. Eyes: Conjunctivae are normal. Head: Atraumatic. Nose: No congestion/rhinnorhea. Mouth/Throat: Mucous membranes are  moist.  Cardiovascular: Normal rate, regular rhythm. Grossly normal heart sounds.  2+ radial pulses bilaterally. Respiratory: Normal respiratory effort.  No retractions. Lungs CTAB.  Status post bilateral mastectomy with surgical sites healing well.  No erythema or warmth noted, small amount of serosanguineous drainage from left drain site, no purulent drainage. Gastrointestinal: Soft and nontender. No distention. Musculoskeletal: No lower extremity tenderness nor edema.  Neurologic:  Normal speech and language. No gross focal neurologic deficits are appreciated.    ED Results / Procedures / Treatments   Labs (all labs ordered are listed, but only abnormal results are displayed) Labs Reviewed - No data to display   PROCEDURES:  Critical Care performed: No  Procedures   MEDICATIONS ORDERED IN ED: Medications - No data to display   IMPRESSION / MDM / ASSESSMENT AND PLAN / ED COURSE  I reviewed the triage vital signs and the nursing notes.                              52 y.o. female with past medical history of breast cancer status post bilateral mastectomy and reconstruction  with JP drain placement presents to the ED with serosanguineous drainage from left JP drain site.  Patient's presentation is most consistent with acute, uncomplicated illness.  Differential diagnosis includes, but is not limited to, displaced drain, infected surgical site, routine healing.  Patient well-appearing and in no acute distress, vital signs are unremarkable.  She appears to have a small amount of serosanguineous drainage from the left mastectomy drain site but no evidence of infection on exam.  The drain appears to be well-positioned with sutures still intact.  Case discussed with Dr. Aleen Campi of general surgery, who agrees with plan for dressing change with application of gauze, plan for follow-up in 2 days with her plastic surgeon.  With replacement of gauze, drainage appears to be improved with  more serosanguineous drainage in the collecting bulb.  Patient counseled to return to the ED for new or worsening symptoms, patient agrees with plan.      FINAL CLINICAL IMPRESSION(S) / ED DIAGNOSES   Final diagnoses:  Bleeding from Jackson-Pratt drain, initial encounter     Rx / DC Orders   ED Discharge Orders     None        Note:  This document was prepared using Dragon voice recognition software and may include unintentional dictation errors.   Chesley Noon, MD 01/21/23 [phone]

## 2023-01-22 ENCOUNTER — Telehealth: Payer: Self-pay | Admitting: Plastic Surgery

## 2023-01-22 ENCOUNTER — Ambulatory Visit (INDEPENDENT_AMBULATORY_CARE_PROVIDER_SITE_OTHER): Payer: BLUE CROSS/BLUE SHIELD | Admitting: Surgical

## 2023-01-22 ENCOUNTER — Telehealth: Payer: Self-pay | Admitting: *Deleted

## 2023-01-22 VITALS — BP 164/111 | HR 68

## 2023-01-22 DIAGNOSIS — C50812 Malignant neoplasm of overlapping sites of left female breast: Secondary | ICD-10-CM

## 2023-01-22 DIAGNOSIS — Z170 Estrogen receptor positive status [ER+]: Secondary | ICD-10-CM

## 2023-01-22 NOTE — Telephone Encounter (Signed)
Patient called and said that she is leaking out of her tubes. She went the the ER yesterday and they told her to follow up with Dr Ulice Bold. She does have an appointment tomorrow but she needed to know what to do now. Please call back at [phone]

## 2023-01-22 NOTE — Progress Notes (Signed)
Patient is a 52 year old female here for follow-up after bilateral mastectomy with Dr. Elenor Legato on 01/18/2023 followed by immediate breast reconstruction placement tissue expanders and Flex HD with Dr. Ulice Bold.  She reports that her father brought her to the emergency room yesterday for significant amount of drainage from around the left and right JP drain openings.  She reports that after being evaluated in the ED, the left JP drain started to function and she has not had any leaking around the drain.  She reports that the right side continues to have significant amount of drainage from around the drain and she is not collecting any drainage in the bulb.  She reports that she is having a lot of difficulty with sleep due to discomfort.  She reports she is not having any infectious symptoms.    Chaperone present on exam On exam dressings are in place over bilateral mastectomy incision.  There is no erythema or cellulitic changes noted.  No subcutaneous fluid collection noted with palpation.  On exam patient has a lot of tenderness to bilateral surgical sites with palpation.  She has active drainage seeping around the right JP drain tube.  The drainage is serosanguineous.  There is no foul odor noted.  A/P:  52 year old female here for follow-up after immediate breast reconstruction on 01/18/2023 with Dr. Ulice Bold.  She is having serosanguineous drainage around the right JP drain tube.  Discussed with patient various options for resolving this issue, discussed placing a pursestring like suture around the tube to assist with closing the open space between the tube and the insertion site wound.  Patient was agreeable to this.  A timeout was performed and verbal consent was obtained.  Caroline More, PA-C and Carmel Sacramento, CMA were present.  Discussed patient's case with Dr. Ulice Bold prior to procedure.  1 cc of 1% lidocaine with epinephrine 1:100,000 was mixed with 1 cc of 0.25% bupivacaine.  The area  was prepped with Betadine.  The entire 2 cc was injected in the patient's right lower breast/upper abdomen just adjacent to the drain insertion site.  Patient tolerated this well.  A few minutes was allowed for the lidocaine and bupivacaine to take effect.  Using a 3-0 Monocryl, a pursestring suture was then placed around the right JP drain.  A total of 2 sutures were placed.  This seemed to stop any active drainage from the right JP drain opening surrounding the tube.  Patient tolerated this well.  The area was then covered with 4 x 4 gauze and a Mepilex border dressing.  Sterile technique was used.  There were no complications noted.  She has follow-up for tomorrow, recommend keeping this appointment in case she continues to have issues with draining around the tube.  Discussed with patient if she is doing well tonight she can call and cancel her appointment tomorrow and reschedule.  Would like to see her back next week.  There is no signs of infection or concern on exam.

## 2023-01-22 NOTE — Telephone Encounter (Signed)
PA in process for Cymbalta 60 mg

## 2023-01-23 ENCOUNTER — Encounter: Payer: Self-pay | Admitting: *Deleted

## 2023-01-23 ENCOUNTER — Other Ambulatory Visit: Payer: Self-pay | Admitting: Pathology

## 2023-01-23 ENCOUNTER — Encounter: Payer: Self-pay | Admitting: Plastic Surgery

## 2023-01-23 ENCOUNTER — Other Ambulatory Visit: Payer: Self-pay | Admitting: *Deleted

## 2023-01-23 ENCOUNTER — Ambulatory Visit (INDEPENDENT_AMBULATORY_CARE_PROVIDER_SITE_OTHER): Payer: Medicaid Other | Admitting: Plastic Surgery

## 2023-01-23 ENCOUNTER — Other Ambulatory Visit: Payer: Self-pay | Admitting: Physical Medicine and Rehabilitation

## 2023-01-23 VITALS — BP 142/106 | HR 78

## 2023-01-23 DIAGNOSIS — Z9013 Acquired absence of bilateral breasts and nipples: Secondary | ICD-10-CM

## 2023-01-23 LAB — SURGICAL PATHOLOGY

## 2023-01-23 MED ORDER — DULOXETINE HCL 60 MG PO CPEP: ORAL_CAPSULE | ORAL | 3 refills | Status: DC

## 2023-01-23 NOTE — Progress Notes (Signed)
Oncotype Dx order submitted online.   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

## 2023-01-23 NOTE — Progress Notes (Signed)
   Subjective:    Patient ID: Vanessa Medina, female    DOB: 18-Oct-1970, 52 y.o.   MRN: [id number]  The patient is a 52 year old female here for follow-up after undergoing bilateral mastectomies with expander placement on 01/18/2023 in [place].  She has 250 cc in the expanders.  She was here yesterday with concerns that the right drain was leaking.  Today she is here because she is concerned the left drain is not draining.  On exam she looks good there is no sign of infection or hematoma.  The drains look like they are working well.  Bruising and swelling as expected.      Review of Systems  Constitutional: Negative.   Eyes: Negative.   Respiratory: Negative.    Cardiovascular: Negative.   Endocrine: Negative.        Objective:   Physical Exam Cardiovascular:     Pulses: Normal pulses.  Skin:    Capillary Refill: Capillary refill takes less than 2 seconds.  Neurological:     Mental Status: She is oriented to person, place, and time.  Psychiatric:        Mood and Affect: Mood normal.        Behavior: Behavior normal.        Thought Content: Thought content normal.        Judgment: Judgment normal.        Assessment & Plan:     ICD-10-CM   1. S/P mastectomy, bilateral  Z90.13        We placed injectable saline in the Expander using a sterile technique: Right: 50 cc for a total of 300 / 535 cc Left: 50 cc for a total of 300 / 535 cc  I reinforced the dressing of the right drain.  Will plan to see her back in a week and hopefully will be able to remove the drain and continue with expansion.

## 2023-01-24 ENCOUNTER — Ambulatory Visit (INDEPENDENT_AMBULATORY_CARE_PROVIDER_SITE_OTHER): Payer: BLUE CROSS/BLUE SHIELD | Admitting: Physician Assistant

## 2023-01-24 DIAGNOSIS — Z9013 Acquired absence of bilateral breasts and nipples: Secondary | ICD-10-CM

## 2023-01-24 NOTE — Telephone Encounter (Signed)
Case: [id number] Duloxetine denied

## 2023-01-24 NOTE — Progress Notes (Signed)
This is a 52 year old female who is status post bilateral mastectomy followed by immediate breast reconstruction with placement of tissue expanders and Flex HD by Dr. Ulice Bold on 01/18/2023.  The patient has been seen twice in the last several days with some leakage around her right JP drain, and yesterday noting that the left JP drain was not working.  Since her visit yesterday, she continues to note that there is no output from the left JP drain, she notes the right has been putting out 60 to 80 cc/day of serosanguineous output.  She denies any fevers or infectious signs or symptoms.  She notes some soreness in the breast but no severe pain.  Chaperone present.  On exam bilateral mastectomy flaps are viable, dressings are clean dry and intact.  No overlying redness.  Right JP drain with serosanguineous output, left JP drain with no output.  Fluid wave noted over the left breast  Unfortunately the patient has not had any significant output out of the left JP drain.  I did attach a 60 cc syringe to the drain and tried to aspirate fluid, there was a clear clot.  I tried massaging the tubing as well as the lower portion of the breast with no LOC.  She does have some small amount of serous fluid around the drain site.  Given the fact that she has not had any output out of the drain over the last several days and has a fluid collection within the left breast I felt it best to remove the drain given it is nonfunctional at this time.  I discussed risk and benefits of doing this, the patient elected to proceed.  The left JP drain was removed, I was able to aspirate over her port removing approximately 45 cc of serosanguineous fluid, she also had some ongoing drainage from the drain site.  I discussed with the patient that she would need to be seen regularly, if she reaccumulate fluid she will likely need aspiration.  She will be back in our office in 2 days for repeat evaluation or sooner as needed.  She was given  strict return precautions.  She verbalized understanding and agreement to today's plan.

## 2023-01-26 ENCOUNTER — Ambulatory Visit (INDEPENDENT_AMBULATORY_CARE_PROVIDER_SITE_OTHER): Payer: BLUE CROSS/BLUE SHIELD | Admitting: Surgical

## 2023-01-26 VITALS — BP 126/81 | HR 82

## 2023-01-26 DIAGNOSIS — C50812 Malignant neoplasm of overlapping sites of left female breast: Secondary | ICD-10-CM

## 2023-01-26 DIAGNOSIS — Z170 Estrogen receptor positive status [ER+]: Secondary | ICD-10-CM

## 2023-01-26 DIAGNOSIS — Z9013 Acquired absence of bilateral breasts and nipples: Secondary | ICD-10-CM

## 2023-01-26 NOTE — Progress Notes (Signed)
Patient is a 52 year old female here for follow-up after bilateral mastectomy followed by immediate bilateral breast reconstruction placement of tissue expanders and Flex HD with Dr. Ulice Bold on 01/18/2023.  She had some issues postoperatively with her bilateral JP drains, specifically the right one was leaking around the opening and she had additional sutures placed around the opening to help.  She subsequently had issues with the left JP drain producing no output and it was thought to be clogged, therefore was removed at her last appointment on 01/24/2023.  At her last appointment she did have 45 cc of serosanguineous fluid aspirated over the expander port.  She is here today for evaluation of the left side given the drain was removed earlier than 2 weeks.  She reports that she has noticed that the left breast fluid continues to collect after drain removal.  She has not had any drainage from the JP drain insertion site wound.  The right JP drain continues to function well and she is getting about 100 cc per 24 hours at this time.  She is not having any infectious symptoms.  Chaperone present on exam Patient is well-developed, well-nourished, no acute distress, breathing is unlabored. On exam bilateral mastectomy flap incisions are intact and appear to be healing well.  Left axillary incision is intact.  There is subcutaneous fluid collection noted in the left breast.  Mastectomy flaps are viable.  There is no erythema or cellulitic changes noted.  She does have resolving ecchymosis of bilateral flaps.  A/P:  52 year old female 1 week postop from bilateral breast reconstruction with placement tissue expanders and Flex HD.  Unfortunately her left JP drain had to be removed due to it being clogged.  Today we were able to aspirate 100 cc of serosanguineous fluid from the left breast pocket, being careful not to puncture the expander.  Patient tolerated this well.  We did not fill the expander today  since the patient had the expander filled earlier this week on Tuesday, did not want to provide too much tension on the muscle at this time.  Recommend continuing to monitor the area, continue with compressive garments, may benefit from bulking of the left breast with ABD pads for increased compression.  I would like to see her back early next week on Tuesday, 01/30/2023 for evaluation of the left breast and possible needle aspiration again and fill.  There is no signs of infection on exam.  We did not put injectable saline in the Expander using a sterile technique: Right: 0 cc for a total of 300 / 535 cc Left: 0 cc for a total of 300 / 535 cc

## 2023-01-30 ENCOUNTER — Encounter: Payer: Self-pay | Admitting: Surgical

## 2023-01-30 ENCOUNTER — Ambulatory Visit (INDEPENDENT_AMBULATORY_CARE_PROVIDER_SITE_OTHER): Payer: BLUE CROSS/BLUE SHIELD | Admitting: Surgical

## 2023-01-30 DIAGNOSIS — Z9013 Acquired absence of bilateral breasts and nipples: Secondary | ICD-10-CM

## 2023-01-30 DIAGNOSIS — Z170 Estrogen receptor positive status [ER+]: Secondary | ICD-10-CM

## 2023-01-30 DIAGNOSIS — C50812 Malignant neoplasm of overlapping sites of left female breast: Secondary | ICD-10-CM

## 2023-01-30 MED ORDER — CYCLOBENZAPRINE HCL 10 MG PO TABS: 10.0000 mg | ORAL_TABLET | Freq: Two times a day (BID) | ORAL | 0 refills | Status: DC | PRN

## 2023-01-30 NOTE — Progress Notes (Signed)
Patient is a 52 year old female here for follow-up after bilateral mastectomy followed by immediate breast reconstruction placement tissue expanders and Flex HD with Dr. Ulice Bold on 01/18/2023.  She is just shy of 2 weeks postop.  She had some issues postoperatively with bilateral JP drains, the right one had an additional suture placed around the opening to stop the fluid from draining around it.  Unfortunately the left one became clogged and had to be removed prematurely.  She was last seen in the office last week on 01/26/2023, at that time she was continuing to have 100 cc of output per 24 hours from the right and 100 cc of fluid was aspirated from the left breast pocket.  Patient reports today that she is having a lot of discomfort, particularly on the right side.  Reports that the left breast feels full again.  She is not having any infectious symptoms.  She reports that she has been sleeping a little bit better, but still feels exhausted.  She is not having any systemic symptomatic changes  Chaperone present on exam On exam left breast seroma is noted with palpation, no overlying erythema or cellulitic changes.  There is some mild blistering noted of the left mastectomy incision under the Steri-Strips.  Mastectomy flaps are viable.  She does have some mild tenderness to palpation.  On exam of the right breast, subcutaneous seroma/fluid collection is noted with palpation.  There is no overlying erythema or cellulitic changes noted.  The right JP drain appears clogged.  There is some mild blistering noted along the mastectomy incision under the Steri-Strips.  Flaps are viable.  She does have a lot of tenderness, particularly over the inframammary fold.  A/P:  We placed injectable saline in the Expander using a sterile technique: Right: 60 cc for a total of 360 / 535 cc Left: 60 cc for a total of 360 / 535 cc  180 cc of serosanguineous fluid was aspirated from the left breast.  Care was taken to  aspirate over the expander port to avoid puncturing the expander.  Patient tolerated small.  Sterile technique was used.  There is no signs infection or concern on exam.  Steri-Strips were removed, blistering is noted.  Recommend Vaseline and gauze over the area 1-2 times daily.  Recommend Vaseline and gauze over left JP drain and right JP drain insertion sites.  We will continue to follow the patient closely as she will likely continuously need needle aspirations given the ongoing seroma formation.  She is scheduled to follow-up in 2 days.

## 2023-01-31 ENCOUNTER — Encounter: Payer: Self-pay | Admitting: Surgery

## 2023-01-31 ENCOUNTER — Telehealth: Payer: Self-pay | Admitting: Surgical

## 2023-01-31 ENCOUNTER — Ambulatory Visit (INDEPENDENT_AMBULATORY_CARE_PROVIDER_SITE_OTHER): Payer: BLUE CROSS/BLUE SHIELD | Admitting: Surgery

## 2023-01-31 VITALS — BP 103/68 | HR 73 | Temp 97.6°F | Ht 64.0 in | Wt 258.8 lb

## 2023-01-31 DIAGNOSIS — Z08 Encounter for follow-up examination after completed treatment for malignant neoplasm: Secondary | ICD-10-CM

## 2023-01-31 DIAGNOSIS — Z170 Estrogen receptor positive status [ER+]: Secondary | ICD-10-CM

## 2023-01-31 DIAGNOSIS — C50412 Malignant neoplasm of upper-outer quadrant of left female breast: Secondary | ICD-10-CM

## 2023-01-31 DIAGNOSIS — Z09 Encounter for follow-up examination after completed treatment for conditions other than malignant neoplasm: Secondary | ICD-10-CM

## 2023-01-31 NOTE — Telephone Encounter (Signed)
Patient called to follow up on an order that was to be sent to [facility] for DME bra fitting. She says [facility] has not received the order yet.  Please advise.

## 2023-01-31 NOTE — Patient Instructions (Signed)
If you have any concerns or questions, please feel free to call our office. See follow up appointment.   Total or Modified Radical Mastectomy, Care After The following information offers guidance on how to care for yourself after your procedure. Your health care provider may also give you more specific instructions. If you have problems or questions, contact your health care provider. What can I expect after the procedure? After the procedure, it is common to have: Pain, soreness, or tenderness. Numbness. Swelling. Neuropathic (nerve) pain in the chest wall, armpit, or arm. Stiffness in the arm or shoulder. Feelings of stress, sadness, or depression. If the lymph nodes under your arm were removed, you may have arm swelling, weakness, or numbness on the same side of your body as your surgery. Follow these instructions at home: Incision care  Follow instructions from your health care provider about how to take care of your incision. Make sure you: Wash your hands with soap and water for at least 20 seconds before you change your bandage (dressing). If soap and water are not available, use hand sanitizer. Change your dressing as told by your health care provider. Leave stitches (sutures), skin glue, or adhesive strips in place. These skin closures may need to stay in place for 2 weeks or longer. If adhesive strip edges start to loosen and curl up, you may trim the loose edges. Do not remove adhesive strips completely unless your health care provider tells you to do that. Check your incision area every day for signs of infection. Check for: Redness, swelling, or more pain. Fluid or blood. Warmth. Pus or a bad smell. If you were sent home with a surgical drain in place, follow instructions from your health care provider. This may include emptying the drain and keeping track of the amount of drainage. Bathing Do not take baths, swim, or use a hot tub until your health care provider approves. Ask  your health care provider if you may take showers. You may only be allowed to take sponge baths. Activity  Return to your normal activities as told by your health care provider. Ask your health care provider what activities are safe for you. It may take several weeks to return to full activity. Avoid activities that take a lot of effort. Rest as told by your health care provider. Ask friends and family to help with chores, including child care, meal preparation, laundry, and shopping. Be careful to avoid any activities that could cause an injury to your arm on the side of your surgery. Do not lift anything that is heavier than 10 lb (4.5 kg), or the limit that you are told, until your health care provider says that it is safe. Avoid lifting with the arm on the side of your surgery. Do not carry heavy objects on your shoulder. After your drain is removed, do exercises to prevent stiffness and swelling in your arm. Talk with your health care provider about which exercises are safe for you. General instructions Take over-the-counter and prescription medicines only as told by your health care provider. You may eat what you usually do. Keep your arm raised (elevated) above the level of your heart when you are sitting or lying down. Do not wear tight jewelry on your arm, wrist, or fingers on the side of your surgery. You may be given a tight sleeve (compression bandage) to wear over your arm on the side of your surgery. Wear this sleeve as told by your health care provider. Ask your  health care provider when you can start wearing a bra or using a breast prosthesis. Before you are involved in certain procedures, such as giving blood or having your blood pressure checked, tell all of your health care providers if lymph nodes under your arm were removed. This is important information. Follow-up Keep all follow-up visits. This is important. Get checked for extra fluid around your lymph nodes (lymphedema)  as often as told by your health care provider. Medicines Take over-the-counter and prescription medicines as told by your health care provider. Take your antibiotic medicine as told by your health care provider. Do not stop taking the antibiotic even if you start to feel better. Ask your health care provider if the medicine prescribed to you: Requires you to avoid driving or using machinery. Can cause constipation. You may need to take these actions to prevent or treat constipation: Drink enough fluid to keep your urine pale yellow. Take over-the-counter or prescription medicines. Eat foods that are high in fiber, such as beans, whole grains, and fresh fruits and vegetables. Limit foods that are high in fat and processed sugars, such as fried or sweet foods. Lifestyle Do not use any products that contain nicotine or tobacco before the procedure. These products include cigarettes, chewing tobacco, and vaping devices, such as e-cigarettes. These can delay incision healing. If you need help quitting, ask your health care provider. Contact a health care provider if: You have chills or a fever. Your pain medicine is not working. Your arm swelling, weakness, or numbness has not improved after a few weeks. You have new swelling in your breast area or arm. You have redness, swelling, or more pain in your incision area. You have fluid or blood coming from your incision. Your incision feels warm to the touch. You have pus or a bad smell coming from your incision. Get help right away if: You have very bad pain in your breast area or arm. You have a sudden onset of chest pain. You have a fast heart rate. You have difficulty breathing. These symptoms may be an emergency. Get help right away. Call 911. Do not wait to see if the symptoms will go away. Do not drive yourself to the hospital. Summary Follow instructions from your health care provider about how to take care of your incision. Check your  incision area every day for signs of infection. Ask your health care provider what activities are safe for you. Keep all follow-up visits. This is important. Contact a health care provider if you have new swelling in your breast area or arm. This information is not intended to replace advice given to you by your health care provider. Make sure you discuss any questions you have with your health care provider. Document Revised: 05/22/2021 Document Reviewed: 05/22/2021 Elsevier Patient Education  2024 [employer].

## 2023-02-01 ENCOUNTER — Encounter: Payer: Self-pay | Admitting: Surgical

## 2023-02-01 ENCOUNTER — Ambulatory Visit: Payer: Medicaid Other | Admitting: Internal Medicine

## 2023-02-01 ENCOUNTER — Ambulatory Visit (INDEPENDENT_AMBULATORY_CARE_PROVIDER_SITE_OTHER): Payer: BLUE CROSS/BLUE SHIELD | Admitting: Surgical

## 2023-02-01 VITALS — BP 107/69 | HR 81 | Ht 64.0 in | Wt 255.0 lb

## 2023-02-01 DIAGNOSIS — Z170 Estrogen receptor positive status [ER+]: Secondary | ICD-10-CM

## 2023-02-01 DIAGNOSIS — C50812 Malignant neoplasm of overlapping sites of left female breast: Secondary | ICD-10-CM

## 2023-02-01 DIAGNOSIS — Z9013 Acquired absence of bilateral breasts and nipples: Secondary | ICD-10-CM

## 2023-02-01 NOTE — Progress Notes (Addendum)
Patient is a 52 year old female here for follow-up after bilateral mastectomy followed by immediate breast reconstruction and placement of tissue expanders and Flex HD with Dr. Ulice Bold and Dr. Everlene Farrier on 01/18/2023.  Postoperatively, bilateral JP drains became clogged and were subsequently removed prematurely.  At her last appointment on 01/30/2023, the right JP drain was removed due to it being clogged and she had immediate fluid that drained from the JP drain insertion site.  180 cc of serosanguineous fluid was aspirated over the left breast at her last appointment.  She reports today that she has noticed the fluid has reaccumulated in both the right and left side.  She does report that she had taken some of the Flexeril that was prescribed at her last appointment, but it made her very sleepy.  She is not having any infectious symptoms.  Chaperone present on exam On exam bilateral mastectomy flaps are overall viable.  Along the right medial incision she does have a small superficial wound that is approximately 4 x 4 mm.  There is no ecchymosis or cellulitic changes noted of either breast.  Left breast incision is intact and healing well.  She does have bilateral subcutaneous fluid collections noted with palpation, left worse than right.  She has ongoing tenderness with palpation.  A/P:  30 cc of serosanguineous fluid was aspirated from the right breast pocket, care taken not to puncture the expander.  50 cc of serosanguineous fluid was aspirated from the left breast pocket with care not to puncture the expander.  Sterile technique was used.  Patient tolerated this well.  Recommend Vaseline and gauze to right breast mastectomy incision as she has some scabbing and a small wound noted medially.  I do not appreciate any signs of infection or concern on exam, unfortunately she is continuing to accumulate fluid and developed a seroma.  She was last aspirated 2 days ago and subsequently developed  50 cc of fluid since then.  She is scheduled for close follow-up early next week on Tuesday.  Discussed with patient that Dr. Ulice Bold may be able to evaluate her at that time as well.  Discussed with patient to avoid using Flexeril, if she feels she needs any muscle relaxers after expansion, may be able to try Robaxin or Valium.  She was agreeable to not use any more of the Flexeril at this time.  Will plan to discuss case with Dr. Ulice Bold and update patient as needed.

## 2023-02-02 NOTE — Telephone Encounter (Addendum)
States cancelled. Vanessa Medina (Key: [id number]) - [id number] Cymbalta 60MG  dr capsules Status: PA Response - Denied

## 2023-02-02 NOTE — Telephone Encounter (Signed)
Faxed mastectomy supply request to [facility] with confirmed receipt. Pt aware.

## 2023-02-02 NOTE — Progress Notes (Signed)
Vanessa Medina is 2 weeks out from bilateral mastectomies on the left sentinel lymph node biopsy.  Immediate reconstructions. Pathology discussed with the patient in detail.  She does have micrometastases on the left side. Drains have been removed by plastic surgery. She is getting aspirations for seromas. She denies fevers, chills  PE NAD Chest: dressing by plastics covering incisions. Skin on chest walls are free of infection, no evidence of axillary infection or lymphedema.  A/P Doing well w some seroma  being addressed by plastics Pending referral to oncology  RTC in 1 month

## 2023-02-05 ENCOUNTER — Ambulatory Visit (INDEPENDENT_AMBULATORY_CARE_PROVIDER_SITE_OTHER): Payer: BLUE CROSS/BLUE SHIELD | Admitting: Surgical

## 2023-02-05 ENCOUNTER — Encounter: Payer: Self-pay | Admitting: Surgical

## 2023-02-05 ENCOUNTER — Telehealth: Payer: Self-pay | Admitting: Surgical

## 2023-02-05 VITALS — BP 124/74 | HR 82 | Temp 98.0°F

## 2023-02-05 DIAGNOSIS — Z9013 Acquired absence of bilateral breasts and nipples: Secondary | ICD-10-CM

## 2023-02-05 DIAGNOSIS — C50812 Malignant neoplasm of overlapping sites of left female breast: Secondary | ICD-10-CM

## 2023-02-05 DIAGNOSIS — Z170 Estrogen receptor positive status [ER+]: Secondary | ICD-10-CM

## 2023-02-05 NOTE — Progress Notes (Signed)
Patient is a 52 year old female here for evaluation of her right breast.  She underwent immediate bilateral breast reconstruction after bilateral mastectomy on 01/18/2023 with Dr. Ulice Bold and Dr. Everlene Farrier.  Postoperatively bilateral JP drains became clogged and they were removed prematurely due to this.  She is here today with concerns over redness of the right breast and bleeding from the incision.  She reports that she has also noticed increased fluid accumulation as well.  She reports that she has noticed swelling of her bilateral lower extremities as well, particularly her feet.  She reports that she is not having any infectious symptoms, she is not having any shortness of breath or chest pain.  She has been up and and ambulating without any issues.  She does not have any history of DVT.  She is not noticing any lower extremity tenderness.   Chaperone present on exam On exam bilateral mastectomy flaps are viable, she does have a superficial wound along the right mastectomy flap that is 6-1/2 x 0.5 x 0.1 cm.  There is surrounding erythema, but no cellulitic changes or active drainage noted at this time.  Left breast incision is intact and healing well.  She does have bilateral subcutaneous fluid collection noted palpation  Bilateral lower extremity swelling is mild, no pedal edema is noted.  No tenderness of her calf or thighs noted.  No erythema noted.  A/P: 200 cc of serosanguineous fluid was aspirated from the right breast pocket, care was taken to not puncture the expander.  60 cc of serosanguineous fluid was aspirated to the left breast pocket with care not to puncture the expander.  Sterile technique was used in both locations.  We placed injectable saline in the Expander using a sterile technique: Right: 50 cc for a total of 410 / 535 cc Left: 50 cc for a total of 410 / 535 cc  Recommend patient follow-up tomorrow for reevaluation at her previously scheduled appointment.  I would like  Dr. Ulice Bold to also evaluate patient to discuss possibility for return to the operating room for placement of JP drains given the significant serous accumulation.  I do not see any signs of overt infection on exam, however due to the redness of the right breast surrounding the wound, will provide antibiotic prophylaxis to decrease risk of expander pocket infection.  Recommend continue to monitor lower extremity swelling, have a low suspicion for DVT at this time given the bilateral swelling and her overall low risk.  I have encouraged her to continue to ambulate, keep her legs elevated and monitor for any symptomatic changes including increased swelling, erythema, tenderness, shortness of breath or chest pain.  I discussed with the patient that that if the lower extremity swelling continues, recommend follow-up with PCP for additional evaluation.  Pictures were obtained of the patient and placed in the chart with the patient's or guardian's permission.  Discussed with patient that I would discuss her case with Dr. Ulice Bold

## 2023-02-05 NOTE — Telephone Encounter (Signed)
Pt. Was seen earlier today and was told a Rx would be called in at the pharmacy.  Pharmacy has not received it yet.  Pt was wanting to know if it would be called in today.  Please call at [phone]

## 2023-02-06 ENCOUNTER — Encounter: Payer: Self-pay | Admitting: Physician Assistant

## 2023-02-06 ENCOUNTER — Ambulatory Visit (INDEPENDENT_AMBULATORY_CARE_PROVIDER_SITE_OTHER): Payer: BLUE CROSS/BLUE SHIELD | Admitting: Physician Assistant

## 2023-02-06 VITALS — BP 122/77 | HR 70 | Ht 64.0 in | Wt 255.0 lb

## 2023-02-06 DIAGNOSIS — Z9013 Acquired absence of bilateral breasts and nipples: Secondary | ICD-10-CM

## 2023-02-06 MED ORDER — SULFAMETHOXAZOLE-TRIMETHOPRIM 800-160 MG PO TABS: 1.0000 | ORAL_TABLET | Freq: Two times a day (BID) | ORAL | 0 refills | Status: AC

## 2023-02-06 NOTE — Progress Notes (Signed)
Patient is a pleasant 52 year old female s/p bilateral mastectomy with immediate reconstruction using tissue expanders and Flex HD performed 01/18/2023 by Dr. Ulice Bold and Dr. Everlene Farrier who returns to clinic for postoperative follow-up.  Reviewed chart and she has been coming to clinic every couple of days for aspiration of bilateral seromas.  Careful attention has been made to aspirate directly over the port site.  She was seen most recently here in clinic yesterday at which time 50 cc was injected into each of her expanders for a total of 410/535 cc.  200 cc was aspirated from the right breast pocket and 60 cc aspirated from the left breast pocket at that time.  Mild lower extremity edema was also appreciated bilaterally, patient was encouraged to elevate legs and never possible.  Today, patient is doing okay.  She feels as though her left side expander may have ruptured as it does not feel nearly as projected as the right side.  She is unsure if it is related to the fact that she had lymph node dissection on the left side.  She also feels as though she may have experienced reaccumulation since the seromas were aspirated at yesterday's visit.  She continues to deny any fevers or severe discomfort.  As for her lower extremity swelling, she reports that did improve with some elevation at home.  On exam, the left-sided expander did appear to be slightly reduced in volume compared to the right side.  No erythema to that left side and appears to be healing well.  On the right side, the bordered Mepilex dressing is removed and there is a incisional wound.  Difficult to appreciate the depth given some overlying slough, but the remainder of exam is benign.  No unilateral extremity swelling or significant pedal edema appreciated on exam.  Ambulatory.  Dr. Ulice Bold personally evaluated patient.  Attempted seroma aspiration over each of the port sites as well as the pockets inferior lateral to the expanders without  any appreciable aspirate.  Expander fill was performed to the left side with good effect, appeared to fill out nicely and appeared more symmetric to the contralateral side.  No expander fill was performed to the right side.  We placed injectable saline in the Expander using a sterile technique: Right: 0 cc for a total of 410/535 cc Left: 50 cc for a total of 460/535 cc  At this time, we will continue watchful waiting and close outpatient follow-up.  Will have her plan to come to clinic on Friday for reevaluation, but she understands that she can call should she have any questions or concerns in the interim.  Replaced the bordered Mepilex to the right side.  Will obtain photos at subsequent encounter.

## 2023-02-06 NOTE — Telephone Encounter (Signed)
Pt aware.

## 2023-02-07 ENCOUNTER — Encounter: Payer: Medicaid Other | Admitting: Physician Assistant

## 2023-02-07 ENCOUNTER — Telehealth: Payer: Self-pay

## 2023-02-07 NOTE — Telephone Encounter (Signed)
Faxed wound supply order to [facility] including demographics, insurance card, and most recent ov note. Received fax success confirmation.

## 2023-02-07 NOTE — Telephone Encounter (Signed)
Received correspondence from [facility] confirming that they have provided service for the patient; no further action is required. Forwarding document to front desk for batch scanning.

## 2023-02-08 ENCOUNTER — Encounter: Payer: Self-pay | Admitting: *Deleted

## 2023-02-08 ENCOUNTER — Inpatient Hospital Stay: Payer: BLUE CROSS/BLUE SHIELD | Admitting: Internal Medicine

## 2023-02-08 NOTE — Progress Notes (Signed)
Patient is a pleasant 52 year old female s/p bilateral mastectomy with immediate reconstruction using tissue expanders and Flex HD performed 01/18/2023 by Dr. Ulice Bold and Dr. Everlene Farrier who returns to clinic for postoperative follow-up.   She was last seen here in clinic on 02/06/2023.  At that time, she was evaluated by Dr. Ulice Bold at bedside.  Aspiration was attempted, but did not yield any significant aspirate.  Instead, 50 cc was placed into the left side expander for a total of 460/535 cc.  Patient was concerned that perhaps the left side expander was ruptured, but volume appeared improved after the expander fill.  She did have an incisional wound, but difficult to appreciate depth given overlying slough.  Plan was to place the bordered Mepilex dressing and return in 3 days for close follow-up.  Today, patient is concerned that her left-sided expander did rupture.  She feels as though the volume was placed at last visit which allowed for good projection has already gone away completely.  She has pain on that side and now hears considerably more sloshing and feels fluid outside of the expander.  She is hoping that can be aspirated today.  She stated that it was a day after her expander fill that she leaned forward to pick up a piece of paper and felt a weird sensation of rushing water and then noticed the expander appeared deflated.  On exam, the left side expander does appear to be reduced in volume compared to the right side.  Unclear if complete deflation given rigidity of expander, however it does not appear as prominent as it did after the expander fill earlier this week.  Additionally, there is a prominent fluid wave appreciated on exam.  No considerable seroma on the right side.  The bordered Mepilex dressings are removed and she has a 4 x 0.4 cm incisional wound with overlying superficial appearing slough and then an additional 1.5 x 0.3 cm incisional wound just laterally.  No surrounding erythema or  obvious concern for infection.  Discussed case with Dr. Ulice Bold.  She states that we could either move forward with expander removal and replacement versus implant exchange depending on whether or not patient is satisfied with current volume.  She states that she wants to be at least a D cup postoperatively and does not feel as though she is there with the expansion.  She is hopeful that she can have the expander placed and continue with her reconstructive expansion prior to implant exchange.  Attempted aspiration on the left side, but did not yield any considerable volume.  Fluid wave still appreciated after failed attempt.   After further discussion with Dr. Ulice Bold, plan will be for her to schedule a telephone visit with her specifically on Monday discussed implant exchange versus expander replacement.  Picture(s) obtained of the patient and placed in the chart were with the patient's or guardian's permission.

## 2023-02-08 NOTE — Progress Notes (Signed)
Oncotype Dx is still pending.   Dr. Alena Bills said we could push out Vanessa Medina appt. Since results aren't back.  Called and discussed with Vanessa Medina, appt. Moved to next Thursday.  I told her I would give her a call when results are back and we could potentially move appt. Up sooner.

## 2023-02-09 ENCOUNTER — Ambulatory Visit (INDEPENDENT_AMBULATORY_CARE_PROVIDER_SITE_OTHER): Payer: BLUE CROSS/BLUE SHIELD | Admitting: Physician Assistant

## 2023-02-09 VITALS — BP 134/76 | HR 81 | Temp 97.9°F

## 2023-02-09 DIAGNOSIS — Z9889 Other specified postprocedural states: Secondary | ICD-10-CM

## 2023-02-12 ENCOUNTER — Ambulatory Visit (INDEPENDENT_AMBULATORY_CARE_PROVIDER_SITE_OTHER): Payer: BLUE CROSS/BLUE SHIELD | Admitting: Student

## 2023-02-12 ENCOUNTER — Telehealth: Payer: Self-pay

## 2023-02-12 VITALS — BP 138/56 | HR 67 | Temp 98.7°F

## 2023-02-12 DIAGNOSIS — Z9889 Other specified postprocedural states: Secondary | ICD-10-CM

## 2023-02-12 MED ORDER — METHOCARBAMOL 500 MG PO TABS: 250.0000 mg | ORAL_TABLET | Freq: Three times a day (TID) | ORAL | 0 refills | Status: DC | PRN

## 2023-02-12 NOTE — Telephone Encounter (Signed)
Patient verbalized at her last office visit we couldn't drain her.  Patient is wanting to know if she can come in today to be drained due to being in a lot of pain.

## 2023-02-12 NOTE — Telephone Encounter (Signed)
Patient coming in at 3:00pm today  

## 2023-02-12 NOTE — Progress Notes (Signed)
Patient is a pleasant 52 year old female s/p bilateral mastectomy with immediate reconstruction using tissue expanders and Flex HD performed 01/18/2023 by Dr. Ulice Bold and Dr. Everlene Farrier.  Patient presents to the clinic today for concerns about fluid she is feeling in her breasts.     Patient was last seen in the clinic on 02/09/2023.  At this visit, patient reported she was concerned that her left expander did rupture.  Patient also stated that she heard more sloshing and felt there was fluid outside of her expander.  On exam, the left side expander did appear to have some reduced volume in comparison to the right expander.  There is a prominent fluid wave noted on exam.  Aspiration was attempted but did not yield any considerable volume.  Plan was for patient to schedule a telephone visit with Dr. Ulice Bold to discuss implant exchange versus expander placement.  Today, patient states that she is experiencing pain due to the fluid around her expanders.  She reports the pain began on Friday.  She is requesting aspiration today.  Patient states she is also experiencing muscle spasms.  She states that she took half of the Flexeril but felt very tired after taking it.  She states that she cannot take NSAIDs and has been taking Tylenol with a little bit of relief but she is still experiencing discomfort.  She denies any fevers or chills.  She denies any other issues or concerns.  She states she has been applying Vaseline to her incisions.  Chaperone present on exam.  On exam, patient is sitting upright in no acute distress.  To the right breast, there is a superficial wound noted to the incision that is approximately 4 cm x 0.5 cm.  There is some superficial slough.  There is no surrounding erythema or drainage noted.  There is no overlying redness to the skin.  There is some bruising from previous aspiration and fails.  There is a little bit of fluid palpated on exam.  Skin over the port site was cleaned with alcohol  and aspiration was attempted.  No fluid was able to be aspirated.  To the left breast, there is a significant fluid wave noted.  Incision appears to be intact.  There is bruising to the lateral aspect of the breast.  There is otherwise no bruising or erythema noted to the rest of the breast.  Skin over the port site was cleaned with alcohol.  Approximately 60 cc of serosanguineous drainage was aspirated from the area.  After aspiration, there was still a fluid wave appreciated on exam.  Skin to the lateral breast was cleaned and aspiration was attempted to the lateral breast.  No fluid was able to be aspirated though.  I discussed with the patient that I was not able to aspirate any fluid from her right breast but I was able to get approximately 60 cc from her left breast.  I discussed with her in terms of pain, she should continue to take Tylenol.  I did discuss with her we could try changing her muscle relaxer to a small dose of Robaxin.  I discussed with her that this still could make her drowsy like the Flexeril did and if it does make her drowsy, to discontinue it.  I also discussed with her to not take it prior to driving.  I also recommended she not take it with her gabapentin as this can be sedating.  Patient expressed understanding.  She states that she would like to try  the Robaxin has she has been experiencing spasms.  I recommended the patient keep her appointment with Dr. Ulice Bold tomorrow to discuss possible interventions.  Patient states she also has an appointment next week.  I recommended she keep this.  I did tell the patient though that if she does experience more fluid or has any increasing pain, redness, fevers, chills or any concerns, to let us know and we can see her sooner.  Patient expressed understanding.  I instructed the patient to call us if she has any questions or concerns about anything in the meantime.

## 2023-02-13 ENCOUNTER — Ambulatory Visit (INDEPENDENT_AMBULATORY_CARE_PROVIDER_SITE_OTHER): Payer: BLUE CROSS/BLUE SHIELD | Admitting: Plastic Surgery

## 2023-02-13 DIAGNOSIS — T8543XA Leakage of breast prosthesis and implant, initial encounter: Secondary | ICD-10-CM

## 2023-02-13 DIAGNOSIS — Z9889 Other specified postprocedural states: Secondary | ICD-10-CM

## 2023-02-13 NOTE — Progress Notes (Signed)
   Subjective:    Patient ID: Vanessa Medina, female    DOB: Jan 01, 1971, 52 y.o.   MRN: [id number]  The patient is a 52 year old female joining me by phone for further discussion about her breast expanders.  She underwent bilateral mastectomies with expander placement May 16.  Over the last couple of weeks she has been having seroma formation and as far as we can tell she has a rupture or a leak of the left tissue expander.  This seems to be worse after we expand and she does want to be a D cup if possible.  We discussed options and she would like to do what she can to meet her size goal.      Review of Systems  Constitutional: Negative.   HENT: Negative.    Eyes: Negative.   Respiratory: Negative.    Cardiovascular: Negative.   Gastrointestinal: Negative.   Endocrine: Negative.   Genitourinary: Negative.        Objective:   Physical Exam      Assessment & Plan:     ICD-10-CM   1. Rupture of implant of left breast, initial encounter  T85.43XA       Plan for removal of left breast expander and placement of a new expander.  I will go ahead and order the Flex HD as well as this may need to be reinforced or replaced.  Will plan to do it in the next 2 to 3 weeks. I connected with  SIANI UTKE on 02/13/23 by phone and verified that I am speaking with the correct person using two identifiers.  The patient was at home and I was at the office.  We spent 5 minutes in discussion.   I discussed the limitations of evaluation and management by telemedicine. The patient expressed understanding and agreed to proceed.

## 2023-02-14 ENCOUNTER — Encounter: Payer: Self-pay | Admitting: *Deleted

## 2023-02-14 ENCOUNTER — Inpatient Hospital Stay: Payer: BLUE CROSS/BLUE SHIELD | Attending: Internal Medicine | Admitting: Occupational Therapy

## 2023-02-14 DIAGNOSIS — Z170 Estrogen receptor positive status [ER+]: Secondary | ICD-10-CM | POA: Insufficient documentation

## 2023-02-14 DIAGNOSIS — Z9884 Bariatric surgery status: Secondary | ICD-10-CM | POA: Insufficient documentation

## 2023-02-14 DIAGNOSIS — M25611 Stiffness of right shoulder, not elsewhere classified: Secondary | ICD-10-CM

## 2023-02-14 DIAGNOSIS — C50812 Malignant neoplasm of overlapping sites of left female breast: Secondary | ICD-10-CM | POA: Insufficient documentation

## 2023-02-14 DIAGNOSIS — Z90710 Acquired absence of both cervix and uterus: Secondary | ICD-10-CM | POA: Insufficient documentation

## 2023-02-14 DIAGNOSIS — I10 Essential (primary) hypertension: Secondary | ICD-10-CM | POA: Insufficient documentation

## 2023-02-14 DIAGNOSIS — Z8541 Personal history of malignant neoplasm of cervix uteri: Secondary | ICD-10-CM | POA: Insufficient documentation

## 2023-02-14 DIAGNOSIS — Z9013 Acquired absence of bilateral breasts and nipples: Secondary | ICD-10-CM | POA: Insufficient documentation

## 2023-02-14 DIAGNOSIS — Z7982 Long term (current) use of aspirin: Secondary | ICD-10-CM | POA: Insufficient documentation

## 2023-02-14 DIAGNOSIS — M25612 Stiffness of left shoulder, not elsewhere classified: Secondary | ICD-10-CM

## 2023-02-14 DIAGNOSIS — Z79899 Other long term (current) drug therapy: Secondary | ICD-10-CM | POA: Insufficient documentation

## 2023-02-14 DIAGNOSIS — K219 Gastro-esophageal reflux disease without esophagitis: Secondary | ICD-10-CM | POA: Insufficient documentation

## 2023-02-14 DIAGNOSIS — D7589 Other specified diseases of blood and blood-forming organs: Secondary | ICD-10-CM | POA: Insufficient documentation

## 2023-02-14 DIAGNOSIS — Z9049 Acquired absence of other specified parts of digestive tract: Secondary | ICD-10-CM | POA: Insufficient documentation

## 2023-02-14 NOTE — Therapy (Signed)
[facility] at [facility] [address], [address] [place], [place], [address] Phone: [phone]   Fax:  [phone]  Occupational Therapy Screen   Patient Details  Name: Vanessa Medina MRN: [id number] Date of Birth: [birth year] No data recorded  Encounter Date: 02/14/2023   OT End of Session - 02/14/23 1902     Visit Number 0             Past Medical History:  Diagnosis Date   Anemia    Anxiety    Cervical cancer (HCC)    Family history of adverse reaction to anesthesia     " my mother takes a long time time wake up"   GERD (gastroesophageal reflux disease)    History of dysplastic nevus 01/18/2018   right shoulder, recurrent dysplastic nevus   History of kidney stones    History of shingles    Hypertension    Migraine    Multiple allergies    Obesity    Osteoarthritis    left hip   Pneumonia    PONV (postoperative nausea and vomiting)    slow to wake up   Sleep apnea    does not wear CPAP   Stroke (HCC) 08/11/2020   spinal cord stroke   Wears glasses     Past Surgical History:  Procedure Laterality Date   ABDOMINAL HYSTERECTOMY     APPENDECTOMY     BACK SURGERY     BILIOPANCREATIC DIVISION W DUODENAL SWITCH N/A    BREAST BIOPSY Left 12/06/2022   u/s bx,1:00 heart clip, path pending   BREAST BIOPSY Left 12/06/2022   [name] bx 2:00 ribbon clip path pending   BREAST BIOPSY Left 12/06/2022   [name] LT BREAST BX W LOC DEV EA ADD LESION IMG BX SPEC US GUIDE 12/06/2022 [facility]   BREAST BIOPSY Left 12/06/2022   [name] LT BREAST BX W LOC DEV 1ST LESION IMG BX SPEC US GUIDE 12/06/2022 [facility]   BREAST RECONSTRUCTION WITH PLACEMENT OF TISSUE EXPANDER AND FLEX HD (ACELLULAR HYDRATED DERMIS) Bilateral 01/18/2023   Procedure: IMMEDIATE LEFT BREAST RECONSTRUCTION WITH PLACEMENT OF TISSUE EXPANDER AND FLEX HD (ACELLULAR HYDRATED DERMIS);  Surgeon: Peggye Form, DO;  Location: [facility];  Service: Plastics;  Laterality:  Bilateral;   CHOLECYSTECTOMY     COLONOSCOPY N/A 06/27/2021   Procedure: COLONOSCOPY;  Surgeon: Jaynie Collins, DO;  Location: [facility] ENDOSCOPY;  Service: Gastroenterology;  Laterality: N/A;   DIAGNOSTIC LAPAROSCOPY     LOA   ESOPHAGOGASTRODUODENOSCOPY N/A 06/27/2021   Procedure: ESOPHAGOGASTRODUODENOSCOPY (EGD);  Surgeon: Jaynie Collins, DO;  Location: [facility] ENDOSCOPY;  Service: Gastroenterology;  Laterality: N/A;   ESOPHAGOGASTRODUODENOSCOPY (EGD) WITH PROPOFOL N/A 07/25/2017   Procedure: ESOPHAGOGASTRODUODENOSCOPY (EGD) WITH PROPOFOL;  Surgeon: Scot Jun, MD;  Location[facility] ENDOSCOPY;  Service: Endoscopy;  Laterality: N/A;   KNEE ARTHROSCOPY     LAPAROSCOPIC GASTRIC SLEEVE RESECTION WITH HIATAL HERNIA REPAIR     LUMBAR FUSION  08/29/2022   Revision Lumbar two through five Laminectomy & Fusion with Transforaminal Lumbar interbody fusion   MASTECTOMY W/ SENTINEL NODE BIOPSY Bilateral 01/18/2023   Procedure: MASTECTOMY WITH SENTINEL LYMPH NODE BIOPSY, RNFA to assist;  Surgeon: Leafy Ro, MD;  Location: [facility];  Service: General;  Laterality: Bilateral;   TONSILLECTOMY     TOTAL HIP ARTHROPLASTY Left 01/26/2016   Procedure: LEFT TOTAL HIP ARTHROPLASTY ANTERIOR APPROACH;  Surgeon: Tarry Kos, MD;  Location: [facility];  Service: Orthopedics;  Laterality: Left;  TRANSFORAMINAL LUMBAR INTERBODY FUSION (TLIF) WITH PEDICLE SCREW FIXATION 3 LEVEL  08/2019   Vira Browns, MD L2-L5    There were no vitals filed for this visit.   Subjective Assessment - 02/14/23 1859     Subjective  It has been a rough month.  If anything can go wrong and will go around with me.  I had back surgery end of last year.  I retired early.  Then the drain got blocked.  They had to drain the fluid of my left breast.  And now appears my expander ruptured.  Will have them redone in the next 2 to 3 weeks.    Currently in Pain? No/denies                 LYMPHEDEMA/ONCOLOGY QUESTIONNAIRE -  02/14/23 0001       Right Upper Extremity Lymphedema   15 cm Proximal to Olecranon Process 41 cm    10 cm Proximal to Olecranon Process 38 cm    Olecranon Process 29.5 cm      Left Upper Extremity Lymphedema   15 cm Proximal to Olecranon Process 43 cm    10 cm Proximal to Olecranon Process 40 cm    Olecranon Process 29.5 cm               PLASTIC SURGERY 02/12/23: NOTE: Patient is a pleasant 52 year old female s/p bilateral mastectomy with immediate reconstruction using tissue expanders and Flex HD performed 01/18/2023 by Dr. Ulice Bold and Dr. Everlene Farrier.  Patient presents to the clinic today for concerns about fluid she is feeling in her breasts.      Patient was last seen in the clinic on 02/09/2023.  At this visit, patient reported she was concerned that her left expander did rupture.  Patient also stated that she heard more sloshing and felt there was fluid outside of her expander.  On exam, the left side expander did appear to have some reduced volume in comparison to the right expander.  There is a prominent fluid wave noted on exam.  Aspiration was attempted but did not yield any considerable volume.  Plan was for patient to schedule a telephone visit with Dr. Ulice Bold to discuss implant exchange versus expander placement.   Today, patient states that she is experiencing pain due to the fluid around her expanders.  She reports the pain began on Friday.  She is requesting aspiration today.  Patient states she is also experiencing muscle spasms.  She states that she took half of the Flexeril but felt very tired after taking it.  She states that she cannot take NSAIDs and has been taking Tylenol with a little bit of relief but she is still experiencing discomfort.  She denies any fevers or chills.  She denies any other issues or concerns.  She states she has been applying Vaseline to her incisions.   Chaperone present on exam.  On exam, patient is sitting upright in no acute distress.  To  the right breast, there is a superficial wound noted to the incision that is approximately 4 cm x 0.5 cm.  There is some superficial slough.  There is no surrounding erythema or drainage noted.  There is no overlying redness to the skin.  There is some bruising from previous aspiration and fails.  There is a little bit of fluid palpated on exam.  Skin over the port site was cleaned with alcohol and aspiration was attempted.  No fluid was able to be aspirated.  To the left breast, there is a significant fluid wave noted.  Incision appears to be intact.  There is bruising to the lateral aspect of the breast.  There is otherwise no bruising or erythema noted to the rest of the breast.  Skin over the port site was cleaned with alcohol.  Approximately 60 cc of serosanguineous drainage was aspirated from the area.  After aspiration, there was still a fluid wave appreciated on exam.  Skin to the lateral breast was cleaned and aspiration was attempted to the lateral breast.  No fluid was able to be aspirated though.   I discussed with the patient that I was not able to aspirate any fluid from her right breast but I was able to get approximately 60 cc from her left breast.  I discussed with her in terms of pain, she should continue to take Tylenol.  I did discuss with her we could try changing her muscle relaxer to a small dose of Robaxin.  I discussed with her that this still could make her drowsy like the Flexeril did and if it does make her drowsy, to discontinue it.  I also discussed with her to not take it prior to driving.  I also recommended she not take it with her gabapentin as this can be sedating.  Patient expressed understanding.  She states that she would like to try the Robaxin has she has been experiencing spasms.   I recommended the patient keep her appointment with Dr. Ulice Bold tomorrow to discuss possible interventions.  Patient states she also has an appointment next week.  I recommended she keep  this.     OT SCREEN 02/14/23:  Patient presented OT screen with with postop bilateral mastectomy with immediate bilateral expanders done.   Patient reports left expander ruptured. Patient has a consult with Dr. Ulice Bold.  Will have replacement of left expander in the next 2 to 3 weeks. Patient with decreased bilateral shoulder flexion and abduction. Abduction at 80 degrees in flexion and 100 degrees. Limited external rotation. Reviewed with patient some gentle active assisted range of motion in supine using the wand for shoulder flexion as well as horizontal abduction. Gentle external rotation unilateral and then can attempt bilateral in supine 10 reps Keeping pain under 2/10 with all. Can work on functional reach overhead and external rotation and internal rotation pain-free. Patient to do several times during the day scapular squeezes and scapular retraction for posture. Patient more sedentary as well as her free time activities as crafting and sewing. Retired early because of also back surgery end of 23. Patient to follow-up with me next week.  Patient can benefit from skilled OT services will request a prescription for OT to evaluate and treat for bilateral shoulder range of motion post double mastectomy with expanders. Awaiting replacement of new expander for left                             Visit Diagnosis: Stiffness of left shoulder, not elsewhere classified  Stiffness of right shoulder, not elsewhere classified    Problem List Patient Active Problem List   Diagnosis Date Noted   Ruptured left breast implant 02/13/2023   S/P mastectomy, bilateral 01/18/2023   Genetic testing 12/18/2022   Breast cancer (HCC) 12/08/2022   Family history of breast cancer 12/08/2022   Weight loss 12/08/2022   Profound fatigue 12/08/2022   Primary osteoarthritis of right knee 03/28/2022   Abnormality of gait  04/29/2021   Abnormal sensation in both ears 04/06/2021    Other adverse food reactions, not elsewhere classified, subsequent encounter 04/06/2021   Multiple drug allergies 04/06/2021   Insomnia due to medical condition 02/25/2021   Myofascial muscle pain 02/25/2021   Nerve pain 10/18/2020   Incomplete paraplegia (HCC) 10/18/2020   Orthostatic hypotension 10/18/2020   Chronic migraine without aura without status migrainosus, not intractable    Hypokalemia    Adjustment reaction with anxiety and depression    Spinal cord injury, lumbar, without spinal bone injury, sequela (HCC) 08/18/2020   Paraparesis (HCC)    Post-operative pain    Hypotension    Slow transit constipation    AKI (acute kidney injury) (HCC)    Right leg weakness 08/11/2020   DDD (degenerative disc disease), lumbar 09/02/2019    Class: Chronic   Degenerative disc disease, lumbar 09/02/2019   Chest tightness 09/23/2018   Bradycardia 09/23/2018   Labile blood pressure 03/08/2018   Chronic low back pain 09/03/2017   History of total hip replacement, left 01/26/2016   EDEMA 04/28/2008   LEG PAIN, BILATERAL 12/16/2007   CERVICAL CANCER 07/02/2007   MORBID OBESITY 07/02/2007   DEPRESSION 07/02/2007   COMMON MIGRAINE 07/02/2007   Other allergic rhinitis 07/02/2007   ASTHMA 07/02/2007   GERD 07/02/2007   ELEVATED BLOOD PRESSURE WITHOUT DIAGNOSIS OF HYPERTENSION 07/02/2007    Oletta Cohn, OTR/L,CLT 02/14/2023, 7:04 PM  [facility] at [facility] [address], [address] [place], [place], [address] Phone: [phone]   Fax:  [phone]  Name: Vanessa Medina MRN: [id number] Date of Birth: Sep 10, 1970

## 2023-02-14 NOTE — Progress Notes (Signed)
Called [employer] to inquire about oncotype dx result status.   They did not receive the statement of medical need that was faxed to them on 5/24.  This has been re-faxed.   They have to wait for prior auth for the results to be released.

## 2023-02-15 ENCOUNTER — Telehealth: Payer: Self-pay | Admitting: *Deleted

## 2023-02-15 ENCOUNTER — Other Ambulatory Visit: Payer: Self-pay | Admitting: *Deleted

## 2023-02-15 ENCOUNTER — Encounter: Payer: Self-pay | Admitting: Internal Medicine

## 2023-02-15 ENCOUNTER — Inpatient Hospital Stay (HOSPITAL_BASED_OUTPATIENT_CLINIC_OR_DEPARTMENT_OTHER): Payer: Medicaid Other | Admitting: Internal Medicine

## 2023-02-15 ENCOUNTER — Telehealth: Payer: Self-pay | Admitting: Internal Medicine

## 2023-02-15 VITALS — BP 157/144 | HR 87 | Temp 98.7°F | Wt 262.0 lb

## 2023-02-15 DIAGNOSIS — Z9013 Acquired absence of bilateral breasts and nipples: Secondary | ICD-10-CM | POA: Diagnosis not present

## 2023-02-15 DIAGNOSIS — Z7982 Long term (current) use of aspirin: Secondary | ICD-10-CM | POA: Diagnosis not present

## 2023-02-15 DIAGNOSIS — Z79810 Long term (current) use of selective estrogen receptor modulators (SERMs): Secondary | ICD-10-CM | POA: Diagnosis not present

## 2023-02-15 DIAGNOSIS — Z90710 Acquired absence of both cervix and uterus: Secondary | ICD-10-CM | POA: Diagnosis not present

## 2023-02-15 DIAGNOSIS — Z79899 Other long term (current) drug therapy: Secondary | ICD-10-CM | POA: Diagnosis not present

## 2023-02-15 DIAGNOSIS — Z8541 Personal history of malignant neoplasm of cervix uteri: Secondary | ICD-10-CM | POA: Diagnosis not present

## 2023-02-15 DIAGNOSIS — R911 Solitary pulmonary nodule: Secondary | ICD-10-CM

## 2023-02-15 DIAGNOSIS — C50812 Malignant neoplasm of overlapping sites of left female breast: Secondary | ICD-10-CM | POA: Diagnosis not present

## 2023-02-15 DIAGNOSIS — D7589 Other specified diseases of blood and blood-forming organs: Secondary | ICD-10-CM | POA: Diagnosis not present

## 2023-02-15 DIAGNOSIS — Z170 Estrogen receptor positive status [ER+]: Secondary | ICD-10-CM

## 2023-02-15 DIAGNOSIS — Z9049 Acquired absence of other specified parts of digestive tract: Secondary | ICD-10-CM | POA: Diagnosis not present

## 2023-02-15 DIAGNOSIS — I10 Essential (primary) hypertension: Secondary | ICD-10-CM | POA: Diagnosis not present

## 2023-02-15 DIAGNOSIS — K219 Gastro-esophageal reflux disease without esophagitis: Secondary | ICD-10-CM | POA: Diagnosis not present

## 2023-02-15 DIAGNOSIS — Z9884 Bariatric surgery status: Secondary | ICD-10-CM | POA: Diagnosis not present

## 2023-02-15 NOTE — Telephone Encounter (Signed)
LVM notifying patient that we are out of network with her [employer] plan.

## 2023-02-15 NOTE — Telephone Encounter (Signed)
Patient is allergic to contrast for CT- per scheduler she will need 13 hour prep

## 2023-02-15 NOTE — Progress Notes (Signed)
[name] CONSULT NOTE  Patient Care Team: [name], MD as PCP - General (Family Medicine) [name], [name], MD as PCP - Cardiology (Cardiology) [name], MD as Consulting Physician (Cardiology) [name], DO as Consulting Physician (Neurology) [name], RN as Oncology Nurse Navigator  CANCER STAGING   Cancer Staging  Breast cancer [facility]) Staging form: Breast, AJCC 8th Edition - Clinical stage from 12/06/2022: Stage IA (cT1b, cN0, cM0, G2, ER+, PR+, HER2-) - Signed by [name], MD on 02/15/2023 Stage prefix: Initial diagnosis Histologic grading system: 3 grade system   ASSESSMENT & PLAN:  [name] 52 y.o. female with pmh of Right-sided hemiparesis as complication from permanent spinal stimulator placement, orthostasis on midodrine and fludrocortisone, hypertension, migraine, GERD, cholecystectomy, lumbar fusion in December 2023, gastric sleeve was referred to medical oncology for newly diagnosed left breast hormone positive cancer.  # Left breast IDC, multifocal, ER/PR positive, HER2 negative -The mass was detected on screening mammogram.  Imaging showing 3 masses. Mass 1 at 1:00 3 cm from the nipple measuring 7 x by 8 x 8 mm.  Mass 2 1:30 o'clock 1 cm from nipple measuring 9 x 7 x 9 mm.  Mass 3 at 2:00 1 cm from nipple measuring 6 x 8 x 4 mm.  No suspicious left axillary lymph node 1.  Mass 1 and mass 3 was biopsied consistent with invasive ductal cancer.  Both are strongly expressing ER and PR receptors.  HER2 negative.  -Status post bilateral mastectomies with SLNB left axilla and immediate bilateral reconstruction on 01/18/2023.  Pathology showed multifocal disease 3 separate foci- 12 mm with grade 1, 12 mm with grade 2 and 9 mm with grade 2 invasive mucinous. margins negative, 1/2 LN micrometastatic dz, 0.6 mm deposit, ER/PR >90% positive. HER2 negative.   -Had postoperative complications. With clogging of bilateral JP drain.  Had  multiple aspirations from fluid pockets.  Infection of right breast suture line and was treated with antibiotics.  Rupture of left breast expander.  In next few weeks plan is removal of the ruptured expander and placement of new 1.  I am still waiting on Oncotype DX testing. Discussed with Dr. Aggie Cosier and there is no role for radiation.  Tumor is strongly expressing estrogen and progesterone receptor.  I discussed about role of aromatase inhibitor and side effects such as mood changes, hot flashes, osteoporosis, increased cholesterol.  Patient is reluctant to consider AI due to risk of bone health considering she has hardware in her spine and she does not want any medication that would affect her bones.  We discussed about alternative tamoxifen 20 mg once daily for 5 years.  Side effects such as increased risk of thromboembolic event, mood changes, hot flashes, increased cholesterol was discussed.  Also discussed about increased risk of uterine cancer but she has history of hysterectomy several years ago.  As soon as I have the results for Oncotype DX testing, I will call her and if she does not need chemotherapy I will go ahead and send prescription for tamoxifen.  # Right apical nodule -Detected on breast cancer staging.  CT chest without contrast showed 4 mm right apical nodule.  Repeat was recommended in 6 to 12 months. -Will schedule for follow-up in October.  Patient is allergic to iodine contrast.  Will just do Noncon CT.  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  # Macrocytosis without  anemia -B12 and folic acid normal  # Extreme fatigue -Iron panel and TSH was normal.  # Right-sided hemiparesis -Due to acute spinal cord injury during spinal stimulator placement.  MRI brain from October 2023 with no evidence of stroke  # Chronic back pain status post lumbar fusion -On duloxetine, muscle relaxers  #  Orthostasis -Chronic.  On midodrine and fludrocortisone  #s/p gastric sleeve procedure for weight loss  # Personal history of cervical cancer s/p cryotherapy.  Tells me gets regular Pap smears.   Orders Placed This Encounter  Procedures   CT Chest W Contrast    Standing Status:   Future    Standing Expiration Date:   02/15/2024    Order Specific Question:   If indicated for the ordered procedure, I authorize the administration of contrast media per Radiology protocol    Answer:   Yes    Order Specific Question:   Does the patient have a contrast media/X-ray dye allergy?    Answer:   No    Order Specific Question:   Is patient pregnant?    Answer:   Yes    Order Specific Question:   Preferred imaging location?    Answer:   [facility]   CBC with Differential/Platelet    Standing Status:   Future    Standing Expiration Date:   02/15/2024   Comprehensive metabolic panel    Standing Status:   Future    Standing Expiration Date:   02/15/2024   RTC in 4 months for MD visit, labs, discuss CT.  The total time spent in the appointment was 30 minutes encounter with patients including review of chart and various tests results, discussions about plan of care and coordination of care plan   All questions were answered. The patient knows to call the clinic with any problems, questions or concerns. No barriers to learning was detected.  [name], MD 6/13/20241:21 PM   HISTORY OF PRESENTING ILLNESS:  Vanessa Medina 52 y.o. female with pmh of Right-sided hemiparesis as complication from permanent spinal stimulator placement, orthostasis on midodrine and fludrocortisone, hypertension, migraine, GERD, cholecystectomy, lumbar fusion in December 2023, gastric sleeve was referred to medical oncology for newly diagnosed left breast hormone positive cancer.  Interval history Patient was seen today as follow-up. She had bilateral mastectomy with immediate reconstruction about a month  ago.  Reports has been a whirlwind for her due to multiple complications initially with the JP drains, multiple fluid aspirations and now has left breast rupture of tissue expander.  Feels very tired.  No longer has weight loss but weight gain now.  Reports restricted movement of the right arm feels pain at the surgical site.  Undergoing OT.  I have reviewed her chart and materials related to her cancer extensively and collaborated history with the patient. Summary of oncologic history is as follows: Oncology History  Breast cancer (HCC)  11/23/2022 Mammogram   Screening mammogram-   FINDINGS: In the left breast, possible masses warrant further evaluation. In the right breast, no findings suspicious for malignancy.  Diagnostic mammogram and [name] (11/29/2022) Mass 1:   At 1 o'clock 3 cm from the nipple, there is an irregular hypoechoic mass with irregular margins. There is associated sonographic architectural distortion. It measures approximately 8 x 7 by 8 mm.   Mass 2:   At 1:30 1 cm from the nipple, there is an oval mildly hypoechoic mass with irregular margins. It measures 9 x 7 by 9 mm.  Mass 3:   At 2 o'clock 1 cm from the nipple, there is an oval hypoechoic mass with mildly angular margins. It measures 6 by 8 x 4 mm.   There is approximately 2.1 cm between mass 1 and mass 3. Masses 1-3 span approximately 3.3 cm.   Targeted ultrasound was performed of the LEFT axilla. No suspicious axillary lymph nodes are visualized.   IMPRESSION: 1. There are 3 masses which extend along a ductal ray of the LEFT breast. One of these masses (mass 1) is a highly suspicious 8 mm mass at the site of screening mammographic concern at 1 o'clock 3 cm from the nipple with associated architectural distortion. The 2 other masses extend anteriorly from mass 1 towards the nipple and span in total 3.7 cm. Recommend 2 site ultrasound-guided biopsy for definitive characterization with preferential  sampling of mass 1.  2. No suspicious LEFT axillary adenopathy.   12/06/2022 Initial Biopsy   DIAGNOSIS: A. BREAST, LEFT AT 1:00, 3 CM FROM THE NIPPLE; ULTRASOUND-GUIDED CORE NEEDLE BIOPSY: - INVASIVE MAMMARY CARCINOMA, NO SPECIAL TYPE.  Size of invasive carcinoma: 12 mm in this sample Histologic grade of invasive carcinoma: Grade 1                      Glandular/tubular differentiation score: 2                      Nuclear pleomorphism score: 2                      Mitotic rate score: 1                      Total score: 5 Ductal carcinoma in situ: Present, intermediate grade Lymphovascular invasion: Not identified  LEFT AT 1:00, 3 CM FN;  HEART-SHAPED CLIP  Estrogen Receptor (ER) Status: POSITIVE          Percentage of cells with nuclear positivity: Greater than 90%          Average intensity of staining: Strong   Progesterone Receptor (PgR) Status: POSITIVE          Percentage of cells with nuclear positivity: Greater than 90%          Average intensity of staining: Strong   HER2 (by immunohistochemistry): NEGATIVE (Score 1+)   B. BREAST, LEFT AT 2:00, 1 CM FROM THE NIPPLE; ULTRASOUND-GUIDED CORE NEEDLE BIOPSY: - INVASIVE MAMMARY CARCINOMA, WITH MUCINOUS FEATURES.  Estrogen Receptor (ER) Status: POSITIVE          Percentage of cells with nuclear positivity: Greater than 90%          Average intensity of staining: Strong   Progesterone Receptor (PgR) Status: POSITIVE          Percentage of cells with nuclear positivity: Greater than 90%          Average intensity of staining: Strong   HER2 (by immunohistochemistry): NEGATIVE (Score 0)  Size of invasive carcinoma: 4 mm in this sample Histologic grade of invasive carcinoma: Grade 2                      Glandular/tubular differentiation score: 3                      Nuclear pleomorphism score: 2  Mitotic rate score: 1                      Total score: 6 Ductal carcinoma in situ: Not  identified Lymphovascular invasion: Not identified     12/07/2022 Cancer Staging   Staging form: Breast, AJCC 8th Edition - Clinical stage from 12/06/2022: Stage IA (cT1b, cN0, cM0, G2, ER+, PR+, HER2-) - Signed by [name], MD on 12/08/2022 Stage prefix: Initial diagnosis Histologic grading system: 3 grade system     Genetic Testing   No pathogenic variants identified on the [facility] Hereditary Breast Cancer STAT+ Multi-Cancer+RNA panel. VUS in POLD1 called c.964C>T identified. The report date is 12/15/2022.  The Multi-Cancer + RNA Panel offered by [facility] includes sequencing and/or deletion/duplication analysis of the following 70 genes:  AIP*, ALK, APC*, ATM*, AXIN2*, BAP1*, BARD1*, BLM*, BMPR1A*, BRCA1*, BRCA2*, BRIP1*, CDC73*, CDH1*, CDK4, CDKN1B*, CDKN2A, CHEK2*, CTNNA1*, DICER1*, EPCAM, EGFR, FH*, FLCN*, GREM1, HOXB13, KIT, LZTR1, MAX*, MBD4, MEN1*, MET, MITF, MLH1*, MSH2*, MSH3*, MSH6*, MUTYH*, NF1*, NF2*, NTHL1*, PALB2*, PDGFRA, PMS2*, POLD1*, POLE*, POT1*, PRKAR1A*, PTCH1*, PTEN*, RAD51C*, RAD51D*, RB1*, RET, SDHA*, SDHAF2*, SDHB*, SDHC*, SDHD*, SMAD4*, SMARCA4*, SMARCB1*, SMARCE1*, STK11*, SUFU*, TMEM127*, TP53*, TSC1*, TSC2*, VHL*. RNA analysis is performed for * genes.   01/18/2023 Definitive Surgery   Bilateral mastectomy with left axilla SLNB and immediate breast reconstruction by Dr. Everlene Farrier and Dr. Ulice Bold.  DIAGNOSIS: A.  BREAST, LEFT; MASTECTOMY: - INVASIVE MAMMARY CARCINOMA, MULTIFOCAL. - DUCTAL CARCINOMA IN SITU (DCIS). - SEE CANCER SUMMARY BELOW. - TWO BIOPSY SITES WITH ASSOCIATED CLIPS. - UNREMARKABLE SKIN AND NIPPLE.  B.  BREAST, RIGHT; MASTECTOMY: - BENIGN BREAST TISSUE. - UNREMARKABLE SKIN AND NIPPLE. - NEGATIVE FOR ATYPIA AND MALIGNANCY.  C.  SENTINEL LYMPH NODES, LEFT AXILLARY 1 AND 2; EXCISION: - MICROMETASTATIC CARCINOMA INVOLVES ONE OF TWO LYMPH NODES (1/2).  CANCER CASE SUMMARY: INVASIVE CARCINOMA OF THE BREAST Standard(s): AJCC-UICC  8  SPECIMEN Procedure: Mastectomy Specimen Laterality: Left  TUMOR Histologic Type: Mucinous carcinoma Histologic Grade ([place] Histologic Score)      Glandular (Acinar)/Tubular Differentiation: 3      Nuclear Pleomorphism: 2      Mitotic Rate: 1      Overall Grade: 2 Tumor Size: 12 mm Tumor Focality: Multiple foci of invasive carcinoma (3 separate, see comment) Ductal Carcinoma In Situ (DCIS): Present, intermediate grade Tumor Extent: Not applicable Lymphatic and/or Vascular Invasion: Present Treatment Effect in the Breast: No known presurgical therapy  MARGINS Margin Status for Invasive Carcinoma: All margins negative for invasive carcinoma      Distance from closest margin: 10 mm      Specify closest margin: Anterior  Margin Status for DCIS: All margins negative for DCIS      Distance from DCIS to closest margin: 10 mm      Specify closest margin: Anterior  REGIONAL LYMPH NODES Regional Lymph Node Status: Tumor present in regional lymph node(s)      Number of Lymph Nodes with Macrometastases (greater than 2 mm): 0      Number of Lymph Nodes with Micrometastases (greater than 0.2 mm to 2 mm and/or greater than 200 cells): 1      Number of Lymph Nodes with Isolated Tumor Cells (0.2 mm or less OR 200 cells or less): 0      Size of Largest Metastatic Deposit: 0.6 mm     Extranodal Extension: Not identified      Total Number of Lymph Nodes Examined (sentinel and non-sentinel): 2  Number of Sentinel Nodes Examined: 2   CASE SUMMARY: BREAST BIOMARKER TESTS - LEFT AT 2:00, 1 CM FN;  RIBBON-SHAPED CLIP  Estrogen Receptor (ER) Status: POSITIVE          Percentage of cells with nuclear positivity: Greater than 90%          Average intensity of staining: Strong   Progesterone Receptor (PgR) Status: POSITIVE          Percentage of cells with nuclear positivity: Greater than 90%          Average intensity of staining: Strong   HER2 (by immunohistochemistry):  NEGATIVE (Score 0)       MEDICAL HISTORY:  Past Medical History:  Diagnosis Date   Anemia    Anxiety    Cervical cancer (HCC)    Family history of adverse reaction to anesthesia     " my mother takes a long time time wake up"   GERD (gastroesophageal reflux disease)    History of dysplastic nevus 01/18/2018   right shoulder, recurrent dysplastic nevus   History of kidney stones    History of shingles    Hypertension    Migraine    Multiple allergies    Obesity    Osteoarthritis    left hip   Pneumonia    PONV (postoperative nausea and vomiting)    slow to wake up   Sleep apnea    does not wear CPAP   Stroke (HCC) 08/11/2020   spinal cord stroke   Wears glasses     SURGICAL HISTORY: Past Surgical History:  Procedure Laterality Date   ABDOMINAL HYSTERECTOMY     APPENDECTOMY     BACK SURGERY     BILIOPANCREATIC DIVISION W DUODENAL SWITCH N/A    BREAST BIOPSY Left 12/06/2022   u/s bx,1:00 heart clip, path pending   BREAST BIOPSY Left 12/06/2022   [name] bx 2:00 ribbon clip path pending   BREAST BIOPSY Left 12/06/2022   [name] LT BREAST BX W LOC DEV EA ADD LESION IMG BX SPEC US GUIDE 12/06/2022 [facility]   BREAST BIOPSY Left 12/06/2022   [name] LT BREAST BX W LOC DEV 1ST LESION IMG BX SPEC US GUIDE 12/06/2022 [facility]   BREAST RECONSTRUCTION WITH PLACEMENT OF TISSUE EXPANDER AND FLEX HD (ACELLULAR HYDRATED DERMIS) Bilateral 01/18/2023   Procedure: IMMEDIATE LEFT BREAST RECONSTRUCTION WITH PLACEMENT OF TISSUE EXPANDER AND FLEX HD (ACELLULAR HYDRATED DERMIS);  Surgeon: Peggye Form, DO;  Location: [facility];  Service: Plastics;  Laterality: Bilateral;   CHOLECYSTECTOMY     COLONOSCOPY N/A 06/27/2021   Procedure: COLONOSCOPY;  Surgeon: Jaynie Collins, DO;  Location: [facility] ENDOSCOPY;  Service: Gastroenterology;  Laterality: N/A;   DIAGNOSTIC LAPAROSCOPY     LOA   ESOPHAGOGASTRODUODENOSCOPY N/A 06/27/2021   Procedure: ESOPHAGOGASTRODUODENOSCOPY (EGD);   Surgeon: Jaynie Collins, DO;  Location: [facility];  Service: Gastroenterology;  Laterality: N/A;   ESOPHAGOGASTRODUODENOSCOPY (EGD) WITH PROPOFOL N/A 07/25/2017   Procedure: ESOPHAGOGASTRODUODENOSCOPY (EGD) WITH PROPOFOL;  Surgeon: Scot Jun, MD;  Location: [facility] ENDOSCOPY;  Service: Endoscopy;  Laterality: N/A;   KNEE ARTHROSCOPY     LAPAROSCOPIC GASTRIC SLEEVE RESECTION WITH HIATAL HERNIA REPAIR     LUMBAR FUSION  08/29/2022   Revision Lumbar two through five Laminectomy & Fusion with Transforaminal Lumbar interbody fusion   MASTECTOMY W/ SENTINEL NODE BIOPSY Bilateral 01/18/2023   Procedure: MASTECTOMY WITH SENTINEL LYMPH NODE BIOPSY, RNFA to assist;  Surgeon: Leafy Ro, MD;  Location: [facility];  Service:  General;  Laterality: Bilateral;   TONSILLECTOMY     TOTAL HIP ARTHROPLASTY Left 01/26/2016   Procedure: LEFT TOTAL HIP ARTHROPLASTY ANTERIOR APPROACH;  Surgeon: Tarry Kos, MD;  Location: [facility];  Service: Orthopedics;  Laterality: Left;   TRANSFORAMINAL LUMBAR INTERBODY FUSION (TLIF) WITH PEDICLE SCREW FIXATION 3 LEVEL  08/2019   Vira Browns, MD L2-L5    SOCIAL HISTORY: Social History   Socioeconomic History   Marital status: Divorced    Spouse name: Not on file   Number of children: 1   Years of education: Not on file   Highest education level: Not on file  Occupational History   Not on file  Tobacco Use   Smoking status: Never    Passive exposure: Never   Smokeless tobacco: Never  Vaping Use   Vaping Use: Never used  Substance and Sexual Activity   Alcohol use: Yes    Comment: occasional wine   Drug use: No   Sexual activity: Not Currently  Other Topics Concern   Not on file  Social History Narrative   Right handed    Uses cane to walk    Son has Medical and legal POA.   Social Determinants of Health   Financial Resource Strain: Medium Risk (12/11/2022)   Overall Financial Resource Strain (CARDIA)    Difficulty of Paying Living Expenses:  Somewhat hard  Food Insecurity: No Food Insecurity (01/18/2023)   Hunger Vital Sign    Worried About Running Out of Food in the Last Year: Never true    Ran Out of Food in the Last Year: Never true  Recent Concern: Food Insecurity - Food Insecurity Present (12/08/2022)   Hunger Vital Sign    Worried About Running Out of Food in the Last Year: Often true    Ran Out of Food in the Last Year: Often true  Transportation Needs: No Transportation Needs (01/18/2023)   PRAPARE - Administrator, Civil Service (Medical): No    Lack of Transportation (Non-Medical): No  Physical Activity: Inactive (12/11/2022)   Exercise Vital Sign    Days of Exercise per Week: 0 days    Minutes of Exercise per Session: 0 min  Stress: Stress Concern Present (12/11/2022)   [employer] of Occupational Health - Occupational Stress Questionnaire    Feeling of Stress : To some extent  Social Connections: Moderately Isolated (12/11/2022)   Social Connection and Isolation Panel [NHANES]    Frequency of Communication with Friends and Family: Three times a week    Frequency of Social Gatherings with Friends and Family: Twice a week    Attends Religious Services: 1 to 4 times per year    Active Member of [employer] or Organizations: No    Attends Banker Meetings: Never    Marital Status: Divorced  Catering manager Violence: Not At Risk (01/18/2023)   Humiliation, Afraid, Rape, and Kick questionnaire    Fear of Current or Ex-Partner: No    Emotionally Abused: No    Physically Abused: No    Sexually Abused: No    FAMILY HISTORY: Family History  Problem Relation Age of Onset   Renal Disease Mother    Hypertension Mother    Sudden Cardiac Death Mother    Heart failure Mother    Valvular heart disease Mother    Heart disease Mother    Breast cancer Maternal Grandmother    Stroke Brother    Heart attack Brother 21   Diabetes Other  ALLERGIES:  is allergic to bee venom, latex, meperidine hcl,  morphine, shellfish allergy, codeine, meperidine hcl, oxycodone-acetaminophen, bee pollen, iodinated contrast media, oxycodone, pentazocine, oxycodone-acetaminophen, pentazocine lactate, propoxyphene, and propoxyphene n-acetaminophen.  MEDICATIONS:  Current Outpatient Medications  Medication Sig Dispense Refill   aspirin EC 81 MG tablet Take 81 mg by mouth daily. Swallow whole.     azelastine (ASTELIN) 0.1 % nasal spray Place into the nose.     baclofen (LIORESAL) 10 MG tablet Take 1 tablet (10 mg total) by mouth 3 (three) times daily. - for spasms 270 tablet 3   cephALEXin (KEFLEX) 500 MG capsule Take 1 capsule (500 mg total) by mouth 4 (four) times daily. 20 capsule 0   diclofenac Sodium (VOLTAREN) 1 % GEL APPLY 4 GRAMS TO AFFECTED  AREA(S) TOPICALLY 4 TIMES DAILY (Patient taking differently: as needed.) 1500 g 1   DULoxetine (CYMBALTA) 60 MG capsule Take 1 capsule by mouth nightly for nerve pain.Generic is good 90 capsule 3   EPINEPHrine 0.3 mg/0.3 mL IJ SOAJ injection      fludrocortisone (FLORINEF) 0.1 MG tablet Take 2 tablets (0.2 mg total) by mouth daily. For orthostatic hypotension- 180 tablet 3   gabapentin (NEURONTIN) 300 MG capsule Take 1 capsule (300 mg total) by mouth at bedtime. For nerve pain 90 capsule 3   HYDROcodone-acetaminophen (NORCO/VICODIN) 5-325 MG tablet Take 1 tablet by mouth every 6 (six) hours as needed for moderate pain. 20 tablet 0   methocarbamol (ROBAXIN) 500 MG tablet Take 0.5 tablets (250 mg total) by mouth every 8 (eight) hours as needed for up to 10 doses for muscle spasms. 5 tablet 0   midodrine (PROAMATINE) 10 MG tablet Take 1 tablet (10 mg total) by mouth 3 (three) times daily. For orthostatic hypotension 270 tablet 3   montelukast (SINGULAIR) 10 MG tablet TAKE 1 TABLET BY MOUTH AT  BEDTIME FOR ALLERGIES 30 tablet 1   Multiple Vitamin (MULTIVITAMIN WITH MINERALS) TABS tablet Take 1 tablet by mouth 2 (two) times daily.     MYRBETRIQ 25 MG TB24 tablet Take 25  mg by mouth daily.     pramipexole (MIRAPEX) 0.125 MG tablet Take by mouth.     pyridOXINE (VITAMIN B-6) 100 MG tablet Take 1 tablet (100 mg total) by mouth daily. 90 tablet 1   Thiamine HCl (VITAMIN B-1) 250 MG tablet Take 1 tablet (250 mg total) by mouth daily. 90 tablet 1   topiramate (TOPAMAX) 100 MG tablet TAKE 1 TABLET BY MOUTH AT  BEDTIME 90 tablet 3   vitamin B-12 (CYANOCOBALAMIN) 1000 MCG tablet Take 1 tablet (1,000 mcg total) by mouth daily. 90 tablet 1   fluticasone (FLONASE) 50 MCG/ACT nasal spray Place 1-2 sprays into both nostrils daily.     No current facility-administered medications for this visit.    REVIEW OF SYSTEMS:   Pertinent information mentioned in HPI All other systems were reviewed with the patient and are negative.  PHYSICAL EXAMINATION: ECOG PERFORMANCE STATUS: 2 - Symptomatic, <50% confined to bed  Vitals:   02/15/23 1028 02/15/23 1033  BP: (!) 144/126 (!) 157/144  Pulse: 87   Temp: 98.7 F (37.1 C)   SpO2: 99%    Filed Weights   02/15/23 1028  Weight: 262 lb (118.8 kg)    GENERAL:alert, no distress and comfortable SKIN: skin color, texture, turgor are normal, no rashes or significant lesions EYES: normal, conjunctiva are pink and non-injected, sclera clear OROPHARYNX:no exudate, no erythema and lips, buccal mucosa, and tongue normal  NECK: supple, thyroid normal size, non-tender, without nodularity LYMPH:  no palpable lymphadenopathy in the cervical, axillary or inguinal LUNGS: clear to auscultation and percussion with normal breathing effort HEART: regular rate & rhythm and no murmurs and no lower extremity edema ABDOMEN:abdomen soft, non-tender and normal bowel sounds Musculoskeletal:no cyanosis of digits and no clubbing  PSYCH: alert & oriented x 3 with fluent speech NEURO: no focal motor/sensory deficits  LABORATORY DATA:  I have reviewed the data as listed Lab Results  Component Value Date   WBC 6.8 01/19/2023   HGB 10.3 (L)  01/19/2023   HCT 32.6 (L) 01/19/2023   MCV 98.8 01/19/2023   PLT 246 01/19/2023   Recent Labs    12/08/22 1237 01/18/23 1858 01/19/23 0533  NA 137  --  142  K 3.8  --  3.8  CL 110  --  114*  CO2 23  --  22  GLUCOSE 86  --  97  BUN 26*  --  18  CREATININE 1.09* 0.99 1.01*  CALCIUM 8.7*  --  8.6*  GFRNONAA >60 >60 >60  PROT 7.8  --   --   ALBUMIN 4.1  --   --   AST 19  --   --   ALT 15  --   --   ALKPHOS 123  --   --   BILITOT 0.4  --   --     RADIOGRAPHIC STUDIES: I have personally reviewed the radiological images as listed and agreed with the findings in the report. NM Sentinel Node Inj-No Rpt (Breast)  Result Date: 01/18/2023 Sulfur Colloid was injected by the Nuclear Medicine Technologist for sentinel lymph node localization.

## 2023-02-19 ENCOUNTER — Telehealth: Payer: Self-pay | Admitting: *Deleted

## 2023-02-19 NOTE — Telephone Encounter (Signed)
Spoke with patient about [employer]. She states that policy is to be termed. Instructed her to follow up with [facility] once it is termed to make sure her COB is correct.

## 2023-02-20 ENCOUNTER — Other Ambulatory Visit: Payer: Self-pay | Admitting: Physician Assistant

## 2023-02-20 DIAGNOSIS — M25611 Stiffness of right shoulder, not elsewhere classified: Secondary | ICD-10-CM

## 2023-02-20 NOTE — Progress Notes (Signed)
Order for OT

## 2023-02-21 ENCOUNTER — Ambulatory Visit (INDEPENDENT_AMBULATORY_CARE_PROVIDER_SITE_OTHER): Payer: Medicaid Other | Admitting: Physician Assistant

## 2023-02-21 ENCOUNTER — Inpatient Hospital Stay: Payer: Medicaid Other | Admitting: Occupational Therapy

## 2023-02-21 ENCOUNTER — Encounter: Payer: Self-pay | Admitting: Occupational Therapy

## 2023-02-21 ENCOUNTER — Encounter: Payer: Self-pay | Admitting: *Deleted

## 2023-02-21 VITALS — BP 142/80 | HR 62

## 2023-02-21 DIAGNOSIS — M25611 Stiffness of right shoulder, not elsewhere classified: Secondary | ICD-10-CM | POA: Diagnosis present

## 2023-02-21 DIAGNOSIS — M25612 Stiffness of left shoulder, not elsewhere classified: Secondary | ICD-10-CM

## 2023-02-21 DIAGNOSIS — M6281 Muscle weakness (generalized): Secondary | ICD-10-CM | POA: Diagnosis present

## 2023-02-21 DIAGNOSIS — T8543XA Leakage of breast prosthesis and implant, initial encounter: Secondary | ICD-10-CM

## 2023-02-21 DIAGNOSIS — R0789 Other chest pain: Secondary | ICD-10-CM | POA: Insufficient documentation

## 2023-02-21 DIAGNOSIS — Z9889 Other specified postprocedural states: Secondary | ICD-10-CM

## 2023-02-21 DIAGNOSIS — L905 Scar conditions and fibrosis of skin: Secondary | ICD-10-CM | POA: Insufficient documentation

## 2023-02-21 NOTE — Progress Notes (Signed)
Contacted [employer] sciences to inquire about oncotype dx results.   It is still pending authorization from [employer] and they were not able to give an estimated time frame for results.

## 2023-02-21 NOTE — Therapy (Signed)
[facility] [address], [place], [address] Phone: [phone]   Fax:  [phone]  Occupational Therapy Evaluation  Patient Details  Name: Vanessa Medina MRN: [id number] Date of Birth: 1971-03-25 No data recorded  Encounter Date: 02/21/2023   OT End of Session - 02/21/23 1723     Visit Number 1    Number of Visits 24    Date for OT Re-Evaluation 05/17/23    OT Start Time 1400    OT Stop Time 1432    OT Time Calculation (min) 32 min    Activity Tolerance Patient limited by pain;Other (comment)    Behavior During Therapy WFL for tasks assessed/performed             Past Medical History:  Diagnosis Date   Anemia    Anxiety    Cervical cancer (HCC)    Family history of adverse reaction to anesthesia     " my mother takes a long time time wake up"   GERD (gastroesophageal reflux disease)    History of dysplastic nevus 01/18/2018   right shoulder, recurrent dysplastic nevus   History of kidney stones    History of shingles    Hypertension    Migraine    Multiple allergies    Obesity    Osteoarthritis    left hip   Pneumonia    PONV (postoperative nausea and vomiting)    slow to wake up   Sleep apnea    does not wear CPAP   Stroke (HCC) 08/11/2020   spinal cord stroke   Wears glasses     Past Surgical History:  Procedure Laterality Date   ABDOMINAL HYSTERECTOMY     APPENDECTOMY     BACK SURGERY     BILIOPANCREATIC DIVISION W DUODENAL SWITCH N/A    BREAST BIOPSY Left 12/06/2022   u/s bx,1:00 heart clip, path pending   BREAST BIOPSY Left 12/06/2022   [name] bx 2:00 ribbon clip path pending   BREAST BIOPSY Left 12/06/2022   [name] LT BREAST BX W LOC DEV EA ADD LESION IMG BX SPEC US GUIDE 12/06/2022 [facility]   BREAST BIOPSY Left 12/06/2022   [name] LT BREAST BX W LOC DEV 1ST LESION IMG BX SPEC US GUIDE 12/06/2022 [facility]   BREAST RECONSTRUCTION WITH PLACEMENT OF TISSUE EXPANDER AND FLEX HD  (ACELLULAR HYDRATED DERMIS) Bilateral 01/18/2023   Procedure: IMMEDIATE LEFT BREAST RECONSTRUCTION WITH PLACEMENT OF TISSUE EXPANDER AND FLEX HD (ACELLULAR HYDRATED DERMIS);  Surgeon: Peggye Form, DO;  Location: [facility];  Service: Plastics;  Laterality: Bilateral;   CHOLECYSTECTOMY     COLONOSCOPY N/A 06/27/2021   Procedure: COLONOSCOPY;  Surgeon: Jaynie Collins, DO;  Location: [facility] ENDOSCOPY;  Service: Gastroenterology;  Laterality: N/A;   DIAGNOSTIC LAPAROSCOPY     LOA   ESOPHAGOGASTRODUODENOSCOPY N/A 06/27/2021   Procedure: ESOPHAGOGASTRODUODENOSCOPY (EGD);  Surgeon: Jaynie Collins, DO;  Location: [facility]OSCOPY;  Service: Gastroenterology;  Laterality: N/A;   ESOPHAGOGASTRODUODENOSCOPY (EGD) WITH PROPOFOL N/A 07/25/2017   Procedure: ESOPHAGOGASTRODUODENOSCOPY (EGD) WITH PROPOFOL;  Surgeon: Scot Jun, MD;  Location: [facility] ENDOSCOPY;  Service: Endoscopy;  Laterality: N/A;   KNEE ARTHROSCOPY     LAPAROSCOPIC GASTRIC SLEEVE RESECTION WITH HIATAL HERNIA REPAIR     LUMBAR FUSION  08/29/2022   Revision Lumbar two through five Laminectomy & Fusion with Transforaminal Lumbar interbody fusion   MASTECTOMY W/ SENTINEL NODE BIOPSY Bilateral 01/18/2023   Procedure: MASTECTOMY WITH SENTINEL LYMPH NODE BIOPSY, RNFA  to assist;  Surgeon: Leafy Ro, MD;  Location: [facility];  Service: General;  Laterality: Bilateral;   TONSILLECTOMY     TOTAL HIP ARTHROPLASTY Left 01/26/2016   Procedure: LEFT TOTAL HIP ARTHROPLASTY ANTERIOR APPROACH;  Surgeon: Tarry Kos, MD;  Location: [facility];  Service: Orthopedics;  Laterality: Left;   TRANSFORAMINAL LUMBAR INTERBODY FUSION (TLIF) WITH PEDICLE SCREW FIXATION 3 LEVEL  08/2019   Vira Browns, MD L2-L5    There were no vitals filed for this visit.   Subjective Assessment - 02/21/23 1714     Subjective  It has been a rough month.  If anything can go wrong and will go wrong with me.  I had back surgery end of last year.   Then found out  has breast Cancer - I retired early.  Then the drain got blocked after surgery.  They had to drain the fluid of my left breast.  And now  my expander ruptured.  Waiting to be replaced in the next 2 to 3 weeks.  I have pain in my left breast and into my breastbone really bad.  It is so tender.  They drained some fluid of my left breast again today.    Pertinent History 02/21/23 Sugery note : This is a 52 year old female status post bilateral mastectomy with immediate reconstruction using tissue expanders and Flex HD performed on 01/18/2023 by Dr. Ulice Bold and Dr. Everlene Farrier who returns to our clinic for postoperative follow-up.     The patient was last seen in the office on 02/09/2023.  The patient has a likely ruptured the left expander and has had reaccumulation of fluid bilaterally.  Both of her JP drains did malfunction early on in the postoperative period.  She has been having this serous fluid draining fairly consistently.  At her last visit on 02/09/2023 no fluid was drained from either breast.  She did have a televisit with Dr. Ulice Bold where they discussed moving forward with left-sided expander exchange.     Since her last office visit she notes a reaccumulation of fluid within the left breast, she denies any significant fluid accumulation in the right, she denies any infectious signs or symptoms.     Chaperone present.  On exam bilateral mastectomy flaps are viable, right breast with no overlying redness, no palpable fluid collections or area of firmness.  Left breast incision clean dry and intact with palpable fluid wave, no areas of firmness, no overlying redness or warmth to touch.     I was able to aspirate approximately 60 cc of serous fluid from the left breast.     Overall the patient appears to be doing as well as expected, she is anticipating surgical intervention soon.  I like to see her back in our office early next week for repeat evaluation and potential repeat drainage.  The patient will reach out  to our office if she develops any new or worsening signs or symptoms in the meantime.  She verbalized understanding and agreement to today's plan had no further questions or concerns.   Refer to OT for shoulder stiffness    Patient Stated Goals I just want my expander replaced, get the pain better , get my motion better and finish my cancer treatment    Currently in Pain? Yes    Pain Score 6     Pain Location Breast    Pain Orientation Left    Pain Descriptors / Indicators Aching;Tightness;Tender;Pressure    Pain Type Surgical pain  Pain Onset 1 to 4 weeks ago               [facility] OT Assessment - 02/21/23 0001       AROM   Right Shoulder Flexion 145 Degrees    Right Shoulder ABduction 135 Degrees    Right Shoulder Internal Rotation --   WFL tight   Right Shoulder External Rotation --   [facility] TIGHT   Left Shoulder Flexion 115 Degrees    Left Shoulder ABduction 95 Degrees              LYMPHEDEMA/ONCOLOGY QUESTIONNAIRE - 02/21/23 0001       Right Upper Extremity Lymphedema   15 cm Proximal to Olecranon Process 41 cm    10 cm Proximal to Olecranon Process 38 cm    Olecranon Process 29.5 cm      Left Upper Extremity Lymphedema   15 cm Proximal to Olecranon Process 42.5 cm    10 cm Proximal to Olecranon Process 39.5 cm    Olecranon Process 29.5 cm              Patient retired early I did take disability because of multiply surgeries and per morbidities Back surgery end of last year. Was unable to get therapy prior to starting cancer treatment. Patient arrived with short of breath in the clinic today. Patient report hobbies including mostly sewing and crafts. Right-hand-dominant.      Provided patient with active assisted range of motion home exercises to do in supine for shoulder flexion as well as abduction.  10 reps hold 5 seconds Followed by gentle external rotation in supine with a slight pull 10 reps hold 5 seconds Keeping pain under 2/10 if  possible. Patient to do multiple times during the day some scapular squeezes for posture and rounded shoulder.        OT Education - 02/21/23 1721     Education Details Findings of evaluation and home program    Person(s) Educated Patient    Methods Explanation;Demonstration;Tactile cues;Verbal cues;Handout    Comprehension Verbal cues required;Returned demonstration;Verbalized understanding                 OT Long Term Goals - 02/21/23 1832       OT LONG TERM GOAL #1   Title Patient to be independent in home program to increase active range of motion in bilateral shoulders with functional limb at with pain less than a 2/10 pain    Baseline Pain and tenderness in left chest and sternum 6/10 increasing with overhead shoulder flexion and abduction.  Limited severely in flexion and abduction on the left more than the right.    Time 6    Period Weeks    Status New    Target Date 04/04/23      OT LONG TERM GOAL #2   Title Monitor and prevent lymphedema in bilateral upper extremities and thoracic and assess if needed compression.    Baseline Patient is right-hand dominant.  Do show increased swelling in the left upper arm as well as chest but has a ruptured expander at the moment.    Time 6    Period Weeks    Status New    Target Date 04/04/23      OT LONG TERM GOAL #3   Title Bilateral shoulder strength improved for patient to pull overhead shirt and reach overhead with 2 pound weight independently    Baseline Patient limited severely in shoulder and shoulder range  of motion with the left limited to 95 -115 degrees with pain 6/10 over left chest    Time 12    Period Weeks    Status New    Target Date 05/16/23      OT LONG TERM GOAL #4   Title Bilateral shoulder external rotation for patient to be able to wash and do hair with no increase symptoms.    Baseline Unable to do external rotation doing hair.  Abduction to 95 degrees on the left-pain 6 4/10    Time 12     Period Weeks    Status New    Target Date 05/16/23                   Plan - 02/21/23 1818     Clinical Impression Statement Pt present at OT eval post bilateral mastectomy with immediate reconstruction using tissue expanders and Flex HD performed on 01/18/2023 by Dr. Ulice Bold and Dr. Everlene Farrier. Breast CA diagnosis on the L.  Pt was seen by surgeons on 02/09/2023 -  appear to likely have a ruptured left expander and has had reaccumulation of fluid bilaterally.  Both of her JP drains did malfunction early on in the postoperative period.  She had serous of  fluid draining fairly consistently.  Had no fluid drained on 02/09/2023 visit -but yesterday had 60cc drained.  Plan is to have a left-sided expander exchange by Dr Ulice Bold soon.    Pt with decrease AROM in bilateral shoulders with the left worse than the right.  Increased pain over the left chest and sternum 6/10.  Patient is right-hand dominant.  Patient can benefit from skilled OT services to increase motion and increase strength to be able to be independent in ADLs and IADLs.  Reassess and modify exercises in home program throughout her expander exchange surgery and healing.  Monitor for lymphedema symptoms.    OT Occupational Profile and History Problem Focused Assessment - Including review of records relating to presenting problem    Occupational performance deficits (Please refer to evaluation for details): ADL's;IADL's;Rest and Sleep;Social Participation;Leisure    Body Structure / Function / Physical Skills ADL;Decreased knowledge of precautions;Flexibility;ROM;UE functional use;Scar mobility;Muscle spasms;IADL;Pain;Strength;Skin integrity    Rehab Potential Good    Clinical Decision Making Several treatment options, min-mod task modification necessary    Comorbidities Affecting Occupational Performance: May have comorbidities impacting occupational performance   Back surgery Dec 23, bil mastectomy - ruptured expander- replace in the next  2 wks   Modification or Assistance to Complete Evaluation  Min-Moderate modification of tasks or assist with assess necessary to complete eval    OT Frequency 2x / week    OT Duration 12 weeks    OT Treatment/Interventions Self-care/ADL training;Manual lymph drainage;Therapeutic exercise;Patient/family education;DME and/or AE instruction;Manual Therapy;Passive range of motion;Scar mobilization;Therapeutic activities    Consulted and Agree with Plan of Care Patient             Patient will benefit from skilled therapeutic intervention in order to improve the following deficits and impairments:   Body Structure / Function / Physical Skills: ADL, Decreased knowledge of precautions, Flexibility, ROM, UE functional use, Scar mobility, Muscle spasms, IADL, Pain, Strength, Skin integrity       Visit Diagnosis: Stiffness of left shoulder, not elsewhere classified  Stiffness of right shoulder, not elsewhere classified  Chest pain, muscular  Scar tissue  Muscle weakness (generalized)    Problem List Patient Active Problem List   Diagnosis Date Noted  Ruptured left breast implant 02/13/2023   S/P mastectomy, bilateral 01/18/2023   Genetic testing 12/18/2022   Breast cancer (HCC) 12/08/2022   Family history of breast cancer 12/08/2022   Profound fatigue 12/08/2022   Primary osteoarthritis of right knee 03/28/2022   Abnormality of gait 04/29/2021   Abnormal sensation in both ears 04/06/2021   Other adverse food reactions, not elsewhere classified, subsequent encounter 04/06/2021   Multiple drug allergies 04/06/2021   Insomnia due to medical condition 02/25/2021   Myofascial muscle pain 02/25/2021   Nerve pain 10/18/2020   Incomplete paraplegia (HCC) 10/18/2020   Orthostatic hypotension 10/18/2020   Chronic migraine without aura without status migrainosus, not intractable    Hypokalemia    Adjustment reaction with anxiety and depression    Spinal cord injury, lumbar, without  spinal bone injury, sequela (HCC) 08/18/2020   Paraparesis (HCC)    Post-operative pain    Hypotension    Slow transit constipation    AKI (acute kidney injury) (HCC)    Right leg weakness 08/11/2020   DDD (degenerative disc disease), lumbar 09/02/2019    Class: Chronic   Degenerative disc disease, lumbar 09/02/2019   Chest tightness 09/23/2018   Bradycardia 09/23/2018   Labile blood pressure 03/08/2018   Chronic low back pain 09/03/2017   History of total hip replacement, left 01/26/2016   EDEMA 04/28/2008   LEG PAIN, BILATERAL 12/16/2007   CERVICAL CANCER 07/02/2007   MORBID OBESITY 07/02/2007   DEPRESSION 07/02/2007   COMMON MIGRAINE 07/02/2007   Other allergic rhinitis 07/02/2007   ASTHMA 07/02/2007   GERD 07/02/2007   ELEVATED BLOOD PRESSURE WITHOUT DIAGNOSIS OF HYPERTENSION 07/02/2007    Oletta Cohn, OTR/L,CLT 02/21/2023, 6:37 PM  [facility] [address] [place], [address] Phone: [phone]   Fax:  [phone]  Name: Vanessa Medina MRN: [id number] Date of Birth: [birth year]

## 2023-02-21 NOTE — Progress Notes (Signed)
This is a 52 year old female status post bilateral mastectomy with immediate reconstruction using tissue expanders and Flex HD performed on 01/18/2023 by Dr. Ulice Bold and Dr. Everlene Farrier who returns to our clinic for postoperative follow-up.  The patient was last seen in the office on 02/09/2023.  The patient has a likely ruptured the left expander and has had reaccumulation of fluid bilaterally.  Both of her JP drains did malfunction early on in the postoperative period.  She has been having this serous fluid draining fairly consistently.  At her last visit on 02/09/2023 no fluid was drained from either breast.  She did have a televisit with Dr. Ulice Bold where they discussed moving forward with left-sided expander exchange.  Since her last office visit she notes a reaccumulation of fluid within the left breast, she denies any significant fluid accumulation in the right, she denies any infectious signs or symptoms.  Chaperone present.  On exam bilateral mastectomy flaps are viable, right breast with no overlying redness, no palpable fluid collections or area of firmness.  Left breast incision clean dry and intact with palpable fluid wave, no areas of firmness, no overlying redness or warmth to touch.  I was able to aspirate approximately 60 cc of serous fluid from the left breast.  Overall the patient appears to be doing as well as expected, she is anticipating surgical intervention soon.  I like to see her back in our office early next week for repeat evaluation and potential repeat drainage.  The patient will reach out to our office if she develops any new or worsening signs or symptoms in the meantime.  She verbalized understanding and agreement to today's plan had no further questions or concerns.

## 2023-02-22 ENCOUNTER — Encounter: Payer: Self-pay | Admitting: *Deleted

## 2023-02-22 NOTE — Progress Notes (Signed)
Ms. Coll called this morning asking about her test results.   Explained to her that I am being told it is waiting on prior auth.  I called [employer] and was told prior Berkley Harvey has not been initiated.   I also called [employer] to inquire, first they stated prior auth had been sent and then stated they haven't received statement of medical need.   The statement of medical need has been faxed twice, on 5/24 and on 6/12.   I was not told they were waiting on that when I called yesterday.   SOMN has been faxed for a 3rd time.   Also faxed clinicals to [facility] for prior auth.  I updated Ms. Paull on the above.

## 2023-02-28 ENCOUNTER — Encounter: Payer: Self-pay | Admitting: *Deleted

## 2023-02-28 ENCOUNTER — Encounter: Payer: Self-pay | Admitting: Surgery

## 2023-02-28 ENCOUNTER — Ambulatory Visit (INDEPENDENT_AMBULATORY_CARE_PROVIDER_SITE_OTHER): Payer: Medicaid Other | Admitting: Surgery

## 2023-02-28 VITALS — BP 138/81 | HR 73 | Temp 98.1°F | Ht 64.0 in | Wt 260.4 lb

## 2023-02-28 DIAGNOSIS — C50412 Malignant neoplasm of upper-outer quadrant of left female breast: Secondary | ICD-10-CM

## 2023-02-28 DIAGNOSIS — Z170 Estrogen receptor positive status [ER+]: Secondary | ICD-10-CM

## 2023-02-28 DIAGNOSIS — Z09 Encounter for follow-up examination after completed treatment for conditions other than malignant neoplasm: Secondary | ICD-10-CM

## 2023-02-28 DIAGNOSIS — Z08 Encounter for follow-up examination after completed treatment for malignant neoplasm: Secondary | ICD-10-CM

## 2023-02-28 NOTE — Patient Instructions (Addendum)
Follow up with Dr Ulice Bold as scheduled.    Continue with your walking regimen.   Please call and ask to speak with a nurse if you develop questions or concerns.  We will have you follow up here in 3-4 months.

## 2023-02-28 NOTE — Progress Notes (Signed)
Called [employer] again to check on oncotype order.  They have confirmation that [employer] is reviewing the prior auth as of 6/21 and are just waiting for prior auth results.

## 2023-03-01 ENCOUNTER — Ambulatory Visit: Payer: Medicaid Other | Attending: Physician Assistant | Admitting: Occupational Therapy

## 2023-03-01 DIAGNOSIS — M25611 Stiffness of right shoulder, not elsewhere classified: Secondary | ICD-10-CM

## 2023-03-01 DIAGNOSIS — M25612 Stiffness of left shoulder, not elsewhere classified: Secondary | ICD-10-CM

## 2023-03-01 NOTE — Progress Notes (Signed)
  Vanessa Medina is 5 1/2 weeks out from bilateral mastectomies and left sentinel lymph node biopsy.  Immediate reconstructions.  Drains have been removed by plastic surgery. She is getting aspirations for seromas. Developed Left expander leak   PE NAD, chaperone present Chest: Incisions healed w/o infection Fluid on the left chest wall seems to be outside expander. Skin on chest walls are free of infection, no evidence of axillary infection or lymphedema.   A/P Doing well  from mastectomies She has had a though time w reconstruction and plastics to exchange left expander as it is leaking Pending oncotype DX RTC 3 months or so

## 2023-03-01 NOTE — Therapy (Signed)
[facility] [address] [place], [address] Phone: [phone]   Fax:  [phone]  Occupational Therapy Treatment  Patient Details  Name: Vanessa Medina MRN: [id number] Date of Birth: October 22, 1970 No data recorded  Encounter Date: 03/01/2023   OT End of Session - 03/01/23 1745     Visit Number 2    Number of Visits 24    Date for OT Re-Evaluation 05/17/23    OT Start Time 1445    OT Stop Time 1525    OT Time Calculation (min) 40 min    Activity Tolerance Patient limited by pain;Other (comment)    Behavior During Therapy WFL for tasks assessed/performed             Past Medical History:  Diagnosis Date   Anemia    Anxiety    Cervical cancer (HCC)    Family history of adverse reaction to anesthesia     " my mother takes a long time time wake up"   GERD (gastroesophageal reflux disease)    History of dysplastic nevus 01/18/2018   right shoulder, recurrent dysplastic nevus   History of kidney stones    History of shingles    Hypertension    Migraine    Multiple allergies    Obesity    Osteoarthritis    left hip   Pneumonia    PONV (postoperative nausea and vomiting)    slow to wake up   Sleep apnea    does not wear CPAP   Stroke (HCC) 08/11/2020   spinal cord stroke   Wears glasses     Past Surgical History:  Procedure Laterality Date   ABDOMINAL HYSTERECTOMY     APPENDECTOMY     BACK SURGERY     BILIOPANCREATIC DIVISION W DUODENAL SWITCH N/A    BREAST BIOPSY Left 12/06/2022   u/s bx,1:00 heart clip, path pending   BREAST BIOPSY Left 12/06/2022   [name] bx 2:00 ribbon clip path pending   BREAST BIOPSY Left 12/06/2022   [name] LT BREAST BX W LOC DEV EA ADD LESION IMG BX SPEC US GUIDE 12/06/2022 [facility]   BREAST BIOPSY Left 12/06/2022   [name] LT BREAST BX W LOC DEV 1ST LESION IMG BX SPEC US GUIDE 12/06/2022 [facility]   BREAST RECONSTRUCTION WITH PLACEMENT OF TISSUE EXPANDER AND FLEX HD  (ACELLULAR HYDRATED DERMIS) Bilateral 01/18/2023   Procedure: IMMEDIATE LEFT BREAST RECONSTRUCTION WITH PLACEMENT OF TISSUE EXPANDER AND FLEX HD (ACELLULAR HYDRATED DERMIS);  Surgeon: Peggye Form, DO;  Location: [facility];  Service: Plastics;  Laterality: Bilateral;   CHOLECYSTECTOMY     COLONOSCOPY N/A 06/27/2021   Procedure: COLONOSCOPY;  Surgeon: Jaynie Collins, DO;  Location: [facility];  Service: Gastroenterology;  Laterality: N/A;   DIAGNOSTIC LAPAROSCOPY     LOA   ESOPHAGOGASTRODUODENOSCOPY N/A 06/27/2021   Procedure: ESOPHAGOGASTRODUODENOSCOPY (EGD);  Surgeon: Jaynie Collins, DO;  Location: [facility] ENDOSCOPY;  Service: Gastroenterology;  Laterality: N/A;   ESOPHAGOGASTRODUODENOSCOPY (EGD) WITH PROPOFOL N/A 07/25/2017   Procedure: ESOPHAGOGASTRODUODENOSCOPY (EGD) WITH PROPOFOL;  Surgeon: Scot Jun, MD;  Location: [facility] ENDOSCOPY;  Service: Endoscopy;  Laterality: N/A;   KNEE ARTHROSCOPY     LAPAROSCOPIC GASTRIC SLEEVE RESECTION WITH HIATAL HERNIA REPAIR     LUMBAR FUSION  08/29/2022   Revision Lumbar two through five Laminectomy & Fusion with Transforaminal Lumbar interbody fusion   MASTECTOMY W/ SENTINEL NODE BIOPSY Bilateral 01/18/2023   Procedure: MASTECTOMY WITH SENTINEL LYMPH NODE BIOPSY, RNFA  to assist;  Surgeon: Leafy Ro, MD;  Location: [facility];  Service: General;  Laterality: Bilateral;   TONSILLECTOMY     TOTAL HIP ARTHROPLASTY Left 01/26/2016   Procedure: LEFT TOTAL HIP ARTHROPLASTY ANTERIOR APPROACH;  Surgeon: Tarry Kos, MD;  Location: [facility];  Service: Orthopedics;  Laterality: Left;   TRANSFORAMINAL LUMBAR INTERBODY FUSION (TLIF) WITH PEDICLE SCREW FIXATION 3 LEVEL  08/2019   Vira Browns, MD L2-L5    There were no vitals filed for this visit.   Subjective Assessment - 03/01/23 1744     Subjective  Since have seen you last time I walk every day about 2 miles early in the morning.  I also done some exercises like sidestepping and sit  and stand out of the chair.  Done also my shoulder exercises.  I feel better.  Except my sternum is tender.  I am seeing the surgeon tomorrow hopefully they can drain some fluid of my left breast and can find out my surgery date    Pertinent History 02/21/23 Sugery note : This is a 52 year old female status post bilateral mastectomy with immediate reconstruction using tissue expanders and Flex HD performed on 01/18/2023 by Dr. Ulice Bold and Dr. Everlene Farrier who returns to our clinic for postoperative follow-up.     The patient was last seen in the office on 02/09/2023.  The patient has a likely ruptured the left expander and has had reaccumulation of fluid bilaterally.  Both of her JP drains did malfunction early on in the postoperative period.  She has been having this serous fluid draining fairly consistently.  At her last visit on 02/09/2023 no fluid was drained from either breast.  She did have a televisit with Dr. Ulice Bold where they discussed moving forward with left-sided expander exchange.     Since her last office visit she notes a reaccumulation of fluid within the left breast, she denies any significant fluid accumulation in the right, she denies any infectious signs or symptoms.     Chaperone present.  On exam bilateral mastectomy flaps are viable, right breast with no overlying redness, no palpable fluid collections or area of firmness.  Left breast incision clean dry and intact with palpable fluid wave, no areas of firmness, no overlying redness or warmth to touch.     I was able to aspirate approximately 60 cc of serous fluid from the left breast.     Overall the patient appears to be doing as well as expected, she is anticipating surgical intervention soon.  I like to see her back in our office early next week for repeat evaluation and potential repeat drainage.  The patient will reach out to our office if she develops any new or worsening signs or symptoms in the meantime.  She verbalized understanding and  agreement to today's plan had no further questions or concerns.   Refer to OT for shoulder stiffness    Patient Stated Goals I just want my expander replaced, get the pain better , get my motion better and finish my cancer treatment    Currently in Pain? Yes    Pain Score 7     Pain Location --   sternum   Pain Orientation Mid    Pain Descriptors / Indicators Tender    Pain Type Surgical pain    Pain Onset More than a month ago                [facility] OT Assessment - 03/01/23 0001  AROM   Right Shoulder Flexion 150 Degrees    Right Shoulder ABduction 140 Degrees    Left Shoulder Flexion 145 Degrees    Left Shoulder ABduction 120 Degrees             LYMPHEDEMA/ONCOLOGY QUESTIONNAIRE - 03/01/23 0001       Right Upper Extremity Lymphedema   15 cm Proximal to Olecranon Process 43 cm    10 cm Proximal to Olecranon Process 38 cm    Olecranon Process 29.5 cm      Left Upper Extremity Lymphedema   15 cm Proximal to Olecranon Process 44 cm    10 cm Proximal to Olecranon Process 39 cm    Olecranon Process 29 cm               Pt arrive with  reports of walking every day 2 miles and doing some exercises like sit<> stand from chair Side stepping  And standing on one leg- with other leg tapping in flexion, ABD and ext     Pt show great  progress in bilateral shoulder flexion and abduction as well as external rotation without increasing pain and sternum.       Patient to continue with active assisted range of motion home exercises to do in supine for shoulder flexion as well as abduction.  10 reps hold 5 seconds Followed by gentle external rotation in supine with a slight pull 10 reps hold 5 seconds Keeping pain under 2/10 if possible. Patient report doing  multiple times during the day some scapular squeezes for posture and rounded shoulder.   Patient to contact me after exchange of expanders when okayed for surgeon to continue with therapy                 OT Education - 03/01/23 1745     Education Details Progress and changes to home program    Person(s) Educated Patient    Methods Explanation;Demonstration;Tactile cues;Verbal cues;Handout    Comprehension Verbal cues required;Returned demonstration;Verbalized understanding                 OT Long Term Goals - 02/21/23 1832       OT LONG TERM GOAL #1   Title Patient to be independent in home program to increase active range of motion in bilateral shoulders with functional limb at with pain less than a 2/10 pain    Baseline Pain and tenderness in left chest and sternum 6/10 increasing with overhead shoulder flexion and abduction.  Limited severely in flexion and abduction on the left more than the right.    Time 6    Period Weeks    Status New    Target Date 04/04/23      OT LONG TERM GOAL #2   Title Monitor and prevent lymphedema in bilateral upper extremities and thoracic and assess if needed compression.    Baseline Patient is right-hand dominant.  Do show increased swelling in the left upper arm as well as chest but has a ruptured expander at the moment.    Time 6    Period Weeks    Status New    Target Date 04/04/23      OT LONG TERM GOAL #3   Title Bilateral shoulder strength improved for patient to pull overhead shirt and reach overhead with 2 pound weight independently    Baseline Patient limited severely in shoulder and shoulder range of motion with the left limited to 95 -115 degrees with pain 6/10  over left chest    Time 12    Period Weeks    Status New    Target Date 05/16/23      OT LONG TERM GOAL #4   Title Bilateral shoulder external rotation for patient to be able to wash and do hair with no increase symptoms.    Baseline Unable to do external rotation doing hair.  Abduction to 95 degrees on the left-pain 6 4/10    Time 12    Period Weeks    Status New    Target Date 05/16/23                   Plan - 03/01/23 1746      Clinical Impression Statement Pt present at OT eval post bilateral mastectomy with immediate reconstruction using tissue expanders and Flex HD performed on 01/18/2023 by Dr. Ulice Bold and Dr. Everlene Farrier. Breast CA diagnosis on the L.  Pt was seen by surgeons on 02/09/2023 -  appear to likely have a ruptured left expander and has had reaccumulation of fluid bilaterally.  Both of her JP drains did malfunction early on in the postoperative period.  She had serous of  fluid draining fairly consistently.  Had no fluid drained on 02/09/2023 visit -but on 6/18 had 60cc drained.  Seeing surgeon tomorrow again and will maybe find out date for  left-sided expander exchange by Dr Ulice Bold.    Pt  arrive this ate with great progress in bilateral shoulder AROM - as well walking more at home . No increase pain in sternum with shoulder AROM - pt to maintain her progress and will let me know when to start back or follow up after surgery.  Patient is right-hand dominant.  Patient can benefit from skilled OT services to increase motion and increase strength to be able to be independent in ADLs and IADLs.  Reassess and modify exercises in home program throughout her expander exchange surgery and healing.  Monitor for lymphedema symptoms.    OT Occupational Profile and History Problem Focused Assessment - Including review of records relating to presenting problem    Occupational performance deficits (Please refer to evaluation for details): ADL's;IADL's;Rest and Sleep;Social Participation;Leisure    Body Structure / Function / Physical Skills ADL;Decreased knowledge of precautions;Flexibility;ROM;UE functional use;Scar mobility;Muscle spasms;IADL;Pain;Strength;Skin integrity    Rehab Potential Good    Clinical Decision Making Several treatment options, min-mod task modification necessary    Comorbidities Affecting Occupational Performance: May have comorbidities impacting occupational performance    Modification or Assistance to  Complete Evaluation  Min-Moderate modification of tasks or assist with assess necessary to complete eval    OT Frequency 2x / week    OT Duration 12 weeks    OT Treatment/Interventions Self-care/ADL training;Manual lymph drainage;Therapeutic exercise;Patient/family education;DME and/or AE instruction;Manual Therapy;Passive range of motion;Scar mobilization;Therapeutic activities    Consulted and Agree with Plan of Care Patient             Patient will benefit from skilled therapeutic intervention in order to improve the following deficits and impairments:   Body Structure / Function / Physical Skills: ADL, Decreased knowledge of precautions, Flexibility, ROM, UE functional use, Scar mobility, Muscle spasms, IADL, Pain, Strength, Skin integrity       Visit Diagnosis: Stiffness of left shoulder, not elsewhere classified  Stiffness of right shoulder, not elsewhere classified    Problem List Patient Active Problem List   Diagnosis Date Noted   Ruptured left breast implant 02/13/2023   S/P  mastectomy, bilateral 01/18/2023   Genetic testing 12/18/2022   Breast cancer (HCC) 12/08/2022   Family history of breast cancer 12/08/2022   Profound fatigue 12/08/2022   Primary osteoarthritis of right knee 03/28/2022   Abnormality of gait 04/29/2021   Abnormal sensation in both ears 04/06/2021   Other adverse food reactions, not elsewhere classified, subsequent encounter 04/06/2021   Multiple drug allergies 04/06/2021   Insomnia due to medical condition 02/25/2021   Myofascial muscle pain 02/25/2021   Nerve pain 10/18/2020   Incomplete paraplegia (HCC) 10/18/2020   Orthostatic hypotension 10/18/2020   Chronic migraine without aura without status migrainosus, not intractable    Hypokalemia    Adjustment reaction with anxiety and depression    Spinal cord injury, lumbar, without spinal bone injury, sequela (HCC) 08/18/2020   Paraparesis (HCC)    Post-operative pain    Hypotension     Slow transit constipation    AKI (acute kidney injury) (HCC)    Right leg weakness 08/11/2020   DDD (degenerative disc disease), lumbar 09/02/2019    Class: Chronic   Degenerative disc disease, lumbar 09/02/2019   Chest tightness 09/23/2018   Bradycardia 09/23/2018   Labile blood pressure 03/08/2018   Chronic low back pain 09/03/2017   History of total hip replacement, left 01/26/2016   EDEMA 04/28/2008   LEG PAIN, BILATERAL 12/16/2007   CERVICAL CANCER 07/02/2007   MORBID OBESITY 07/02/2007   DEPRESSION 07/02/2007   COMMON MIGRAINE 07/02/2007   Other allergic rhinitis 07/02/2007   ASTHMA 07/02/2007   GERD 07/02/2007   ELEVATED BLOOD PRESSURE WITHOUT DIAGNOSIS OF HYPERTENSION 07/02/2007    Oletta Cohn, OTR/L,CLT 03/01/2023, 5:51 PM  [facility] [address], [place], [address] Phone: [phone]   Fax:  [phone]  Name: Vanessa Medina MRN: [id number] Date of Birth: 05/24/1971

## 2023-03-02 ENCOUNTER — Encounter: Payer: Self-pay | Admitting: Surgical

## 2023-03-02 ENCOUNTER — Other Ambulatory Visit: Payer: Self-pay | Admitting: Internal Medicine

## 2023-03-02 ENCOUNTER — Ambulatory Visit (INDEPENDENT_AMBULATORY_CARE_PROVIDER_SITE_OTHER): Payer: Medicaid Other | Admitting: Surgical

## 2023-03-02 ENCOUNTER — Encounter: Payer: Self-pay | Admitting: *Deleted

## 2023-03-02 VITALS — BP 144/83 | HR 65 | Ht 64.0 in | Wt 255.0 lb

## 2023-03-02 DIAGNOSIS — T8543XA Leakage of breast prosthesis and implant, initial encounter: Secondary | ICD-10-CM

## 2023-03-02 DIAGNOSIS — Z170 Estrogen receptor positive status [ER+]: Secondary | ICD-10-CM

## 2023-03-02 DIAGNOSIS — Z9889 Other specified postprocedural states: Secondary | ICD-10-CM

## 2023-03-02 DIAGNOSIS — Z9013 Acquired absence of bilateral breasts and nipples: Secondary | ICD-10-CM

## 2023-03-02 MED ORDER — DIAZEPAM 2 MG PO TABS: 2.0000 mg | ORAL_TABLET | Freq: Two times a day (BID) | ORAL | 0 refills | Status: DC | PRN

## 2023-03-02 MED ORDER — TAMOXIFEN CITRATE 20 MG PO TABS: 20.0000 mg | ORAL_TABLET | Freq: Every day | ORAL | 0 refills | Status: DC

## 2023-03-02 NOTE — Progress Notes (Signed)
Spoke with Vanessa Medina and informed her Tamoxifen has been sent to [facility] and she may go ahead and start taking it.  Also there was an interaction with her cymbalta and that may need to change, Dr. Alena Bills will discuss with her PCP.

## 2023-03-02 NOTE — Progress Notes (Signed)
Authorization for oncotype came through.   [employer] notified of authorization number.   Oncotype score released, with result of 9, no chemotherapy needed.   Vanessa Medina notified of above.

## 2023-03-02 NOTE — Progress Notes (Signed)
Patient is a 52 year old female here for follow-up on her bilateral breast reconstruction, there was some concern for the left tissue bander being ruptured, however today on evaluation I feel fluid in the expander, and the expander is taut.  I am unsure if the expander is ruptured or not.  She reports she has accumulated more fluid in the left breast.  On exam bilateral breast incisions are intact, bilateral NAC's are surgically absent.  She does have a subcutaneous fluid collection of the left breast, no erythema or cellulitic change.  No subcutaneous fluid collection noted in the right breast.  A/P:  180 cc of serous fluid was aspirated from the left breast, patient tolerated this well. I then placed 100 cc of fluid in the left breast tissue expander.  We placed injectable saline in the Expander using a sterile technique: Right: 0 cc for a total of 410 / 535 cc Left: 100 cc for a total of ?? 560 / 535 cc  We will plan to see her back in 1 week to evaluate if the expander is ruptured or not, she was instructed to keep a close eye on the left breast tissue expander for volume changes.  Recommend using Valium to relax the muscle as we did a fairly large expansion today.  She knows not to take the Valium and gabapentin at the same time.  She will not take the Flexeril which was previously prescribed.  Pictures were obtained of the patient and placed in the chart with the patient's or guardian's permission.

## 2023-03-07 ENCOUNTER — Encounter: Payer: Self-pay | Admitting: Surgical

## 2023-03-07 ENCOUNTER — Ambulatory Visit (INDEPENDENT_AMBULATORY_CARE_PROVIDER_SITE_OTHER): Payer: Medicaid Other | Admitting: Surgical

## 2023-03-07 VITALS — BP 142/84 | HR 72 | Ht 64.0 in | Wt 255.0 lb

## 2023-03-07 DIAGNOSIS — T8543XA Leakage of breast prosthesis and implant, initial encounter: Secondary | ICD-10-CM

## 2023-03-07 DIAGNOSIS — Z9013 Acquired absence of bilateral breasts and nipples: Secondary | ICD-10-CM

## 2023-03-07 DIAGNOSIS — Z9889 Other specified postprocedural states: Secondary | ICD-10-CM

## 2023-03-07 DIAGNOSIS — C50812 Malignant neoplasm of overlapping sites of left female breast: Secondary | ICD-10-CM

## 2023-03-07 MED ORDER — SULFAMETHOXAZOLE-TRIMETHOPRIM 800-160 MG PO TABS: 1.0000 | ORAL_TABLET | Freq: Two times a day (BID) | ORAL | 0 refills | Status: AC

## 2023-03-07 NOTE — Progress Notes (Addendum)
Patient is a very pleasant 52 year old female here for follow-up on her bilateral breast reconstruction.  She was last seen in the office 5 days ago, 100 cc of fluid was placed in the left breast tissue expander.  She reports that she noticed later that evening volume loss in the left breast tissue expander.  She reports otherwise she is doing well.  Chaperone present on exam On exam left breast incision is intact, subcutaneous fluid collection noted upon palpation.  There is no erythema or cellulitic changes noted.  There is minimal tenderness with palpation.  The expander does seem slightly deflated compared to 5 days ago after expander fill.  On exam of the right breast, there is some erythema noted over the superior mastectomy flap extending from the excision.  I do not appreciate any subcutaneous fluid collection.  She does have a small scab along the medial aspect of the mastectomy incision.  There is no overt cellulitic changes, however she does have some skin dimpling.  A/P:  100 cc of serous fluid was aspirated from the left breast using care to inject over the expander port, sterile technique was used.  Patient tolerated this well.  The right breast was then injected using a sterile technique, 40 cc of fluid was removed from the expander.  No fluid was aspirated from the subcutaneous space.  Given the redness, did discuss with patient close monitoring this area, prescription for antibiotics sent to pharmacy in case she notices worsening redness.  I did outline the erythema with a marking pen.  We placed injectable saline in the Expander using a sterile technique: Right: -40 cc for a total of 370 / 535 cc Left: 0 cc for a total of ?560 / 535 cc   Pictures were obtained of the patient and placed in the chart with the patient's or guardian's permission.  Surgical route previously placed, followed up with surgical scheduling team to discuss, patient would like to return to the OR as soon as  possible for expander exchange.  Recommend patient notify our office of any symptomatic changes or concerns.  She has no infectious symptoms today.

## 2023-03-09 ENCOUNTER — Telehealth: Payer: Self-pay | Admitting: *Deleted

## 2023-03-09 NOTE — Telephone Encounter (Signed)
[employer] is now showing as primary payer and [employer] cancelled.  Pending PA [id number] for CPT [id number]

## 2023-03-12 ENCOUNTER — Encounter: Payer: Self-pay | Admitting: Internal Medicine

## 2023-03-14 ENCOUNTER — Ambulatory Visit: Payer: Medicaid Other | Admitting: Surgical

## 2023-03-14 ENCOUNTER — Encounter: Payer: Self-pay | Admitting: Surgical

## 2023-03-14 VITALS — BP 145/85 | HR 38

## 2023-03-14 VITALS — BP 145/85 | HR 51

## 2023-03-14 DIAGNOSIS — D510 Vitamin B12 deficiency anemia due to intrinsic factor deficiency: Secondary | ICD-10-CM | POA: Insufficient documentation

## 2023-03-14 DIAGNOSIS — Z9889 Other specified postprocedural states: Secondary | ICD-10-CM

## 2023-03-14 DIAGNOSIS — Z9013 Acquired absence of bilateral breasts and nipples: Secondary | ICD-10-CM

## 2023-03-14 DIAGNOSIS — T8543XA Leakage of breast prosthesis and implant, initial encounter: Secondary | ICD-10-CM

## 2023-03-14 DIAGNOSIS — Z170 Estrogen receptor positive status [ER+]: Secondary | ICD-10-CM

## 2023-03-14 MED ORDER — HYDROCODONE-ACETAMINOPHEN 5-325 MG PO TABS: 1.0000 | ORAL_TABLET | Freq: Four times a day (QID) | ORAL | 0 refills | Status: AC | PRN

## 2023-03-14 MED ORDER — CEPHALEXIN 500 MG PO CAPS: 500.0000 mg | ORAL_CAPSULE | Freq: Four times a day (QID) | ORAL | 0 refills | Status: AC

## 2023-03-14 MED ORDER — DIAZEPAM 2 MG PO TABS: 2.0000 mg | ORAL_TABLET | Freq: Two times a day (BID) | ORAL | 0 refills | Status: DC | PRN

## 2023-03-14 NOTE — Progress Notes (Signed)
Patient is a 52 year old female here for follow-up on her bilateral breast reconstruction, she was last seen in the office on 03/07/2023.  She has a leaking left breast tissue expander, at her last appointment we drained 100 cc of fluid from the left breast, removed 40 cc of fluid from the right breast due to irritation and erythema of the right breast incision.  She was prescribed a short course of antibiotics, she reports that this significantly helped and the redness has improved.  She does report that she has fluid that has reaccumulated in the left breast.  We have been waiting on insurance authorization to have her return to the OR for exchange of left breast tissue expander.  On exam bilateral breast incisions are intact, they are well-healed.  She has significant amount of subcutaneous fluid collection in the left breast. Erythema of the right breast has significantly improved, I do not appreciate any cellulitic changes or subcutaneous fluid collection in the right breast.  No tenderness noted of either breast.  A/P:  80 cc of serous fluid was aspirated from the left breast, patient overall tolerated this well, she did have some discomfort with the aspiration.  Sterile technique was used.  We placed injectable saline in the Expander using a sterile technique: Right: 0 cc for a total of 370 / 535 cc  Discussed with surgical scheduling team that she received her authorization for returning to the OR for exchange of the left breast ruptured expander for new expander.  Will plan for surgery on 03/26/2023.  Will have her follow-up for virtual preop appointment

## 2023-03-14 NOTE — Progress Notes (Signed)
Patient ID: Vanessa DIBUONO, female    DOB: November 30, 1970, 52 y.o.   MRN: [id number]  Chief Complaint  Patient presents with   Pre-op Exam      ICD-10-CM   1. S/P mastectomy, bilateral  Z90.13     2. Rupture of implant of left breast, initial encounter  T85.43XA       History of Present Illness: Vanessa Medina is a 52 y.o.  female  with a history of bilateral mastectomy and bilateral breast reconstruction, currently with a tissue expander leak on the left side.  She presents for preoperative evaluation for upcoming procedure, exchange of left breast tissue expander for new left breast tissue expander, scheduled for 03/26/2023 with Dr. Ulice Bold.  The patient has not had problems with anesthesia. No history of DVT/PE.  No family history of DVT/PE.  No family or personal history of bleeding or clotting disorders.  Patient is currently on ASA 81 mg daily, reports she takes this because she was sedentary after a spinal cord injury after a spinal cord stimulator surgery.  She reports that she has been much more ambulatory lately and has stopped aspirin in the past prior to surgery without issues.  No history of cardiac disease per patient.  Patient currently has 370 cc in the right breast tissue expander. Unknown volume in the left breast tissue expander due to possible leak  PMH Significant for: History of bilateral mastectomy, GERD, hypertension, migraines, sleep apnea.  Reports she is feeling well lately, no recent changes to her health.  She denies any cardiac or pulmonary disease.  She reports she is understanding of the risks of surgery.   Past Medical History: Allergies: Allergies  Allergen Reactions   Bee Venom Swelling   Latex Hives and Rash   Morphine Other (See Comments)    BRADYCARDIA   Shellfish Allergy Anaphylaxis   Codeine Nausea And Vomiting   Meperidine Hcl Other (See Comments)    BRADYCARDIA   Bee Pollen Other (See Comments)    Unknown   Iodinated Contrast  Media Other (See Comments)    Swelling  Other reaction(s): Other (See Comments)  Swelling   Swelling   Oxycodone Hives and Itching    Blisters on back   Pentazocine Lactate Nausea And Vomiting    REACTION: vomiting with Talwin NX   Propoxyphene Nausea Only and Nausea And Vomiting    Current Medications:  Current Outpatient Medications:    cephALEXin (KEFLEX) 500 MG capsule, Take 1 capsule (500 mg total) by mouth 4 (four) times daily for 5 days., Disp: 20 capsule, Rfl: 0   diazepam (VALIUM) 2 MG tablet, Take 1 tablet (2 mg total) by mouth every 12 (twelve) hours as needed for muscle spasms., Disp: 10 tablet, Rfl: 0   HYDROcodone-acetaminophen (NORCO) 5-325 MG tablet, Take 1 tablet by mouth every 6 (six) hours as needed for up to 5 days for moderate pain., Disp: 20 tablet, Rfl: 0   aspirin EC 81 MG tablet, Take 81 mg by mouth daily. Swallow whole., Disp: , Rfl:    azelastine (ASTELIN) 0.1 % nasal spray, Place into the nose., Disp: , Rfl:    baclofen (LIORESAL) 10 MG tablet, Take 1 tablet (10 mg total) by mouth 3 (three) times daily. - for spasms, Disp: 270 tablet, Rfl: 3   celecoxib (CELEBREX) 200 MG capsule, Take 200 mg by mouth daily., Disp: , Rfl:    diazepam (VALIUM) 2 MG tablet, Take 1 tablet (2 mg total) by mouth  every 12 (twelve) hours as needed for up to 5 doses for muscle spasms., Disp: 5 tablet, Rfl: 0   diclofenac Sodium (VOLTAREN) 1 % GEL, APPLY 4 GRAMS TO AFFECTED  AREA(S) TOPICALLY 4 TIMES DAILY (Patient taking differently: as needed.), Disp: 1500 g, Rfl: 1   DULoxetine (CYMBALTA) 60 MG capsule, Take 1 capsule by mouth nightly for nerve pain.Generic is good, Disp: 90 capsule, Rfl: 3   EPINEPHrine 0.3 mg/0.3 mL IJ SOAJ injection, , Disp: , Rfl:    fludrocortisone (FLORINEF) 0.1 MG tablet, Take 2 tablets (0.2 mg total) by mouth daily. For orthostatic hypotension-, Disp: 180 tablet, Rfl: 3   fluticasone (FLONASE) 50 MCG/ACT nasal spray, Place 1-2 sprays into both nostrils daily.,  Disp: , Rfl:    gabapentin (NEURONTIN) 300 MG capsule, Take 1 capsule (300 mg total) by mouth at bedtime. For nerve pain, Disp: 90 capsule, Rfl: 3   methocarbamol (ROBAXIN) 500 MG tablet, Take 0.5 tablets (250 mg total) by mouth every 8 (eight) hours as needed for up to 10 doses for muscle spasms., Disp: 5 tablet, Rfl: 0   midodrine (PROAMATINE) 10 MG tablet, Take 1 tablet (10 mg total) by mouth 3 (three) times daily. For orthostatic hypotension, Disp: 270 tablet, Rfl: 3   montelukast (SINGULAIR) 10 MG tablet, TAKE 1 TABLET BY MOUTH AT  BEDTIME FOR ALLERGIES, Disp: 30 tablet, Rfl: 1   Multiple Vitamin (MULTIVITAMIN WITH MINERALS) TABS tablet, Take 1 tablet by mouth 2 (two) times daily., Disp: , Rfl:    MYRBETRIQ 25 MG TB24 tablet, Take 25 mg by mouth daily., Disp: , Rfl:    pramipexole (MIRAPEX) 0.125 MG tablet, Take by mouth., Disp: , Rfl:    pyridOXINE (VITAMIN B-6) 100 MG tablet, Take 1 tablet (100 mg total) by mouth daily., Disp: 90 tablet, Rfl: 1   sulfamethoxazole-trimethoprim (BACTRIM DS) 800-160 MG tablet, Take 1 tablet by mouth 2 (two) times daily for 7 days., Disp: 14 tablet, Rfl: 0   tamoxifen (NOLVADEX) 20 MG tablet, Take 1 tablet (20 mg total) by mouth daily., Disp: 90 tablet, Rfl: 0   Thiamine HCl (VITAMIN B-1) 250 MG tablet, Take 1 tablet (250 mg total) by mouth daily., Disp: 90 tablet, Rfl: 1   topiramate (TOPAMAX) 100 MG tablet, TAKE 1 TABLET BY MOUTH AT  BEDTIME, Disp: 90 tablet, Rfl: 3   vitamin B-12 (CYANOCOBALAMIN) 1000 MCG tablet, Take 1 tablet (1,000 mcg total) by mouth daily., Disp: 90 tablet, Rfl: 1  Past Medical Problems: Past Medical History:  Diagnosis Date   Anemia    Anxiety    Cervical cancer (HCC)    Family history of adverse reaction to anesthesia     " my mother takes a long time time wake up"   GERD (gastroesophageal reflux disease)    History of dysplastic nevus 01/18/2018   right shoulder, recurrent dysplastic nevus   History of kidney stones    History  of shingles    Hypertension    Migraine    Multiple allergies    Obesity    Osteoarthritis    left hip   Pneumonia    PONV (postoperative nausea and vomiting)    slow to wake up   Sleep apnea    does not wear CPAP   Stroke (HCC) 08/11/2020   spinal cord stroke   Wears glasses     Past Surgical History: Past Surgical History:  Procedure Laterality Date   ABDOMINAL HYSTERECTOMY     APPENDECTOMY  BACK SURGERY     BILIOPANCREATIC DIVISION W DUODENAL SWITCH N/A    BREAST BIOPSY Left 12/06/2022   u/s bx,1:00 heart clip, path pending   BREAST BIOPSY Left 12/06/2022   [name] bx 2:00 ribbon clip path pending   BREAST BIOPSY Left 12/06/2022   [name] LT BREAST BX W LOC DEV EA ADD LESION IMG BX SPEC US GUIDE 12/06/2022 [facility]   BREAST BIOPSY Left 12/06/2022   [name] LT BREAST BX W LOC DEV 1ST LESION IMG BX SPEC US GUIDE 12/06/2022 [facility]   BREAST RECONSTRUCTION WITH PLACEMENT OF TISSUE EXPANDER AND FLEX HD (ACELLULAR HYDRATED DERMIS) Bilateral 01/18/2023   Procedure: IMMEDIATE LEFT BREAST RECONSTRUCTION WITH PLACEMENT OF TISSUE EXPANDER AND FLEX HD (ACELLULAR HYDRATED DERMIS);  Surgeon: Peggye Form, DO;  Location: [facility];  Service: Plastics;  Laterality: Bilateral;   CHOLECYSTECTOMY     COLONOSCOPY N/A 06/27/2021   Procedure: COLONOSCOPY;  Surgeon: Jaynie Collins, DO;  Location: [facility] ENDOSCOPY;  Service: Gastroenterology;  Laterality: N/A;   DIAGNOSTIC LAPAROSCOPY     LOA   ESOPHAGOGASTRODUODENOSCOPY N/A 06/27/2021   Procedure: ESOPHAGOGASTRODUODENOSCOPY (EGD);  Surgeon: Jaynie Collins, DO;  Location: [facility] ENDOSCOPY;  Service: Gastroenterology;  Laterality: N/A;   ESOPHAGOGASTRODUODENOSCOPY (EGD) WITH PROPOFOL N/A 07/25/2017   Procedure: ESOPHAGOGASTRODUODENOSCOPY (EGD) WITH PROPOFOL;  Surgeon: Scot Jun, MD;  Location: [facility] ENDOSCOPY;  Service: Endoscopy;  Laterality: N/A;   KNEE ARTHROSCOPY     LAPAROSCOPIC GASTRIC SLEEVE RESECTION WITH HIATAL  HERNIA REPAIR     LUMBAR FUSION  08/29/2022   Revision Lumbar two through five Laminectomy & Fusion with Transforaminal Lumbar interbody fusion   MASTECTOMY W/ SENTINEL NODE BIOPSY Bilateral 01/18/2023   Procedure: MASTECTOMY WITH SENTINEL LYMPH NODE BIOPSY, RNFA to assist;  Surgeon: Leafy Ro, MD;  Location: [facility];  Service: General;  Laterality: Bilateral;   TONSILLECTOMY     TOTAL HIP ARTHROPLASTY Left 01/26/2016   Procedure: LEFT TOTAL HIP ARTHROPLASTY ANTERIOR APPROACH;  Surgeon: Tarry Kos, MD;  Location: [facility];  Service: Orthopedics;  Laterality: Left;   TRANSFORAMINAL LUMBAR INTERBODY FUSION (TLIF) WITH PEDICLE SCREW FIXATION 3 LEVEL  08/2019   Vira Browns, MD L2-L5    Social History: Social History   Socioeconomic History   Marital status: Divorced    Spouse name: Not on file   Number of children: 1   Years of education: Not on file   Highest education level: Not on file  Occupational History   Not on file  Tobacco Use   Smoking status: Never    Passive exposure: Never   Smokeless tobacco: Never  Vaping Use   Vaping Use: Never used  Substance and Sexual Activity   Alcohol use: Yes    Comment: occasional wine   Drug use: No   Sexual activity: Not Currently  Other Topics Concern   Not on file  Social History Narrative   Right handed    Uses cane to walk    Son has Medical and legal POA.   Social Determinants of Health   Financial Resource Strain: Medium Risk (12/11/2022)   Overall Financial Resource Strain (CARDIA)    Difficulty of Paying Living Expenses: Somewhat hard  Food Insecurity: No Food Insecurity (01/18/2023)   Hunger Vital Sign    Worried About Running Out of Food in the Last Year: Never true    Ran Out of Food in the Last Year: Never true  Recent Concern: Food Insecurity - Food Insecurity Present (12/08/2022)   Hunger Vital  Sign    Worried About Programme researcher, broadcasting/film/video in the Last Year: Often true    Ran Out of Food in the Last Year: Often true   Transportation Needs: No Transportation Needs (01/18/2023)   PRAPARE - Administrator, Civil Service (Medical): No    Lack of Transportation (Non-Medical): No  Physical Activity: Inactive (12/11/2022)   Exercise Vital Sign    Days of Exercise per Week: 0 days    Minutes of Exercise per Session: 0 min  Stress: Stress Concern Present (12/11/2022)   [employer] of Occupational Health - Occupational Stress Questionnaire    Feeling of Stress : To some extent  Social Connections: Moderately Isolated (12/11/2022)   Social Connection and Isolation Panel [NHANES]    Frequency of Communication with Friends and Family: Three times a week    Frequency of Social Gatherings with Friends and Family: Twice a week    Attends Religious Services: 1 to 4 times per year    Active Member of [employer] or Organizations: No    Attends Banker Meetings: Never    Marital Status: Divorced  Catering manager Violence: Not At Risk (01/18/2023)   Humiliation, Afraid, Rape, and Kick questionnaire    Fear of Current or Ex-Partner: No    Emotionally Abused: No    Physically Abused: No    Sexually Abused: No    Family History: Family History  Problem Relation Age of Onset   Renal Disease Mother    Hypertension Mother    Sudden Cardiac Death Mother    Heart failure Mother    Valvular heart disease Mother    Heart disease Mother    Breast cancer Maternal Grandmother    Stroke Brother    Heart attack Brother 83   Diabetes Other     Review of Systems: Review of Systems  Respiratory: Negative.    Cardiovascular: Negative.   Gastrointestinal: Negative.   Musculoskeletal: Negative.   Neurological: Negative.     Physical Exam: Vital Signs BP (!) 145/85   Pulse (!) 38   Physical Exam Constitutional:      General: Not in acute distress.    Appearance: Normal appearance. Not ill-appearing.  HENT:     Head: Normocephalic and atraumatic.  Eyes:     Pupils: Pupils are equal,  round Neck:     Musculoskeletal: Normal range of motion.  Cardiovascular:     Rate and Rhythm: Normal rate    Pulses: Normal pulses.  Pulmonary:     Effort: Pulmonary effort is normal. No respiratory distress.  Abdominal:     General: Abdomen is flat. There is no distension.  Musculoskeletal: Normal range of motion.  Skin:    General: Skin is warm and dry.     Findings: No erythema or rash.  Neurological:     General: No focal deficit present.     Mental Status: Alert and oriented to person, place, and time. Mental status is at baseline.     Motor: No weakness.  Psychiatric:        Mood and Affect: Mood normal.        Behavior: Behavior normal.    Assessment/Plan: The patient is scheduled for exchange of left breast tissue expander for new expander with Dr. Ulice Bold.  Risks, benefits, and alternatives of procedure discussed, questions answered and consent obtained.    Smoking Status: Non-smoker; Counseling Given?  N/A I  Caprini Score: 7, high; Risk Factors include: Age age, currently on tamoxifen,  history of cancer, BMI > 25, and length of planned surgery. Recommendation for mechanical and possible pharmacological prophylaxis. Encourage early ambulation.   Pictures previously obtained.  Post-op Rx sent to pharmacy: Oxycodone, Valium, Keflex  Patient patient was previously provided with the General surgical risk consent form and expander consent form.  She reports that she is aware of the risks related to exchanging the expander for new expander.  She underwent bilateral mastectomy with immediate bilateral breast reconstruction on 01/18/2023.  We discussed the risks again today and she is in agreement to proceed with surgery.  The risks that can be encountered with and after placement of a breast expander placement were discussed and include the following but not limited to these: bleeding, infection, delayed healing, anesthesia risks, skin sensation changes, injury to  structures including nerves, blood vessels, and muscles which may be temporary or permanent, allergies to tape, suture materials and glues, blood products, topical preparations or injected agents, skin contour irregularities, skin discoloration and swelling, deep vein thrombosis, cardiac and pulmonary complications, pain, which may persist, fluid accumulation, wrinkling of the skin over the expander, changes in nipple or breast sensation, expander leakage or rupture, faulty position of the expander, persistent pain, formation of tight scar tissue around the expander (capsular contracture), possible need for revisional surgery or staged procedures.  We discussed holding tamoxifen 2 weeks prior to and 2 weeks after surgery to decrease risk of VTE.  Spoke with Dr. Alena Bills via MyChart, she confirmed holding tamoxifen prior to and after surgery.  Patient reports she has held aspirin 5 to 7 days prior to surgery in the past and is planning to do that again as well.   Electronically signed by: Kermit Balo Bertin Inabinet, PA-C 03/14/2023 12:55 PM

## 2023-03-14 NOTE — H&P (View-Only) (Signed)
Patient ID: ALONIA DIBUONO, female    DOB: November 30, 1970, 52 y.o.   MRN: [id number]  Chief Complaint  Patient presents with   Pre-op Exam      ICD-10-CM   1. S/P mastectomy, bilateral  Z90.13     2. Rupture of implant of left breast, initial encounter  T85.43XA       History of Present Illness: KINDEL ROCHEFORT is a 52 y.o.  female  with a history of bilateral mastectomy and bilateral breast reconstruction, currently with a tissue expander leak on the left side.  She presents for preoperative evaluation for upcoming procedure, exchange of left breast tissue expander for new left breast tissue expander, scheduled for 03/26/2023 with Dr. Ulice Bold.  The patient has not had problems with anesthesia. No history of DVT/PE.  No family history of DVT/PE.  No family or personal history of bleeding or clotting disorders.  Patient is currently on ASA 81 mg daily, reports she takes this because she was sedentary after a spinal cord injury after a spinal cord stimulator surgery.  She reports that she has been much more ambulatory lately and has stopped aspirin in the past prior to surgery without issues.  No history of cardiac disease per patient.  Patient currently has 370 cc in the right breast tissue expander. Unknown volume in the left breast tissue expander due to possible leak  PMH Significant for: History of bilateral mastectomy, GERD, hypertension, migraines, sleep apnea.  Reports she is feeling well lately, no recent changes to her health.  She denies any cardiac or pulmonary disease.  She reports she is understanding of the risks of surgery.   Past Medical History: Allergies: Allergies  Allergen Reactions   Bee Venom Swelling   Latex Hives and Rash   Morphine Other (See Comments)    BRADYCARDIA   Shellfish Allergy Anaphylaxis   Codeine Nausea And Vomiting   Meperidine Hcl Other (See Comments)    BRADYCARDIA   Bee Pollen Other (See Comments)    Unknown   Iodinated Contrast  Media Other (See Comments)    Swelling  Other reaction(s): Other (See Comments)  Swelling   Swelling   Oxycodone Hives and Itching    Blisters on back   Pentazocine Lactate Nausea And Vomiting    REACTION: vomiting with Talwin NX   Propoxyphene Nausea Only and Nausea And Vomiting    Current Medications:  Current Outpatient Medications:    cephALEXin (KEFLEX) 500 MG capsule, Take 1 capsule (500 mg total) by mouth 4 (four) times daily for 5 days., Disp: 20 capsule, Rfl: 0   diazepam (VALIUM) 2 MG tablet, Take 1 tablet (2 mg total) by mouth every 12 (twelve) hours as needed for muscle spasms., Disp: 10 tablet, Rfl: 0   HYDROcodone-acetaminophen (NORCO) 5-325 MG tablet, Take 1 tablet by mouth every 6 (six) hours as needed for up to 5 days for moderate pain., Disp: 20 tablet, Rfl: 0   aspirin EC 81 MG tablet, Take 81 mg by mouth daily. Swallow whole., Disp: , Rfl:    azelastine (ASTELIN) 0.1 % nasal spray, Place into the nose., Disp: , Rfl:    baclofen (LIORESAL) 10 MG tablet, Take 1 tablet (10 mg total) by mouth 3 (three) times daily. - for spasms, Disp: 270 tablet, Rfl: 3   celecoxib (CELEBREX) 200 MG capsule, Take 200 mg by mouth daily., Disp: , Rfl:    diazepam (VALIUM) 2 MG tablet, Take 1 tablet (2 mg total) by mouth  every 12 (twelve) hours as needed for up to 5 doses for muscle spasms., Disp: 5 tablet, Rfl: 0   diclofenac Sodium (VOLTAREN) 1 % GEL, APPLY 4 GRAMS TO AFFECTED  AREA(S) TOPICALLY 4 TIMES DAILY (Patient taking differently: as needed.), Disp: 1500 g, Rfl: 1   DULoxetine (CYMBALTA) 60 MG capsule, Take 1 capsule by mouth nightly for nerve pain.Generic is good, Disp: 90 capsule, Rfl: 3   EPINEPHrine 0.3 mg/0.3 mL IJ SOAJ injection, , Disp: , Rfl:    fludrocortisone (FLORINEF) 0.1 MG tablet, Take 2 tablets (0.2 mg total) by mouth daily. For orthostatic hypotension-, Disp: 180 tablet, Rfl: 3   fluticasone (FLONASE) 50 MCG/ACT nasal spray, Place 1-2 sprays into both nostrils daily.,  Disp: , Rfl:    gabapentin (NEURONTIN) 300 MG capsule, Take 1 capsule (300 mg total) by mouth at bedtime. For nerve pain, Disp: 90 capsule, Rfl: 3   methocarbamol (ROBAXIN) 500 MG tablet, Take 0.5 tablets (250 mg total) by mouth every 8 (eight) hours as needed for up to 10 doses for muscle spasms., Disp: 5 tablet, Rfl: 0   midodrine (PROAMATINE) 10 MG tablet, Take 1 tablet (10 mg total) by mouth 3 (three) times daily. For orthostatic hypotension, Disp: 270 tablet, Rfl: 3   montelukast (SINGULAIR) 10 MG tablet, TAKE 1 TABLET BY MOUTH AT  BEDTIME FOR ALLERGIES, Disp: 30 tablet, Rfl: 1   Multiple Vitamin (MULTIVITAMIN WITH MINERALS) TABS tablet, Take 1 tablet by mouth 2 (two) times daily., Disp: , Rfl:    MYRBETRIQ 25 MG TB24 tablet, Take 25 mg by mouth daily., Disp: , Rfl:    pramipexole (MIRAPEX) 0.125 MG tablet, Take by mouth., Disp: , Rfl:    pyridOXINE (VITAMIN B-6) 100 MG tablet, Take 1 tablet (100 mg total) by mouth daily., Disp: 90 tablet, Rfl: 1   sulfamethoxazole-trimethoprim (BACTRIM DS) 800-160 MG tablet, Take 1 tablet by mouth 2 (two) times daily for 7 days., Disp: 14 tablet, Rfl: 0   tamoxifen (NOLVADEX) 20 MG tablet, Take 1 tablet (20 mg total) by mouth daily., Disp: 90 tablet, Rfl: 0   Thiamine HCl (VITAMIN B-1) 250 MG tablet, Take 1 tablet (250 mg total) by mouth daily., Disp: 90 tablet, Rfl: 1   topiramate (TOPAMAX) 100 MG tablet, TAKE 1 TABLET BY MOUTH AT  BEDTIME, Disp: 90 tablet, Rfl: 3   vitamin B-12 (CYANOCOBALAMIN) 1000 MCG tablet, Take 1 tablet (1,000 mcg total) by mouth daily., Disp: 90 tablet, Rfl: 1  Past Medical Problems: Past Medical History:  Diagnosis Date   Anemia    Anxiety    Cervical cancer (HCC)    Family history of adverse reaction to anesthesia     " my mother takes a long time time wake up"   GERD (gastroesophageal reflux disease)    History of dysplastic nevus 01/18/2018   right shoulder, recurrent dysplastic nevus   History of kidney stones    History  of shingles    Hypertension    Migraine    Multiple allergies    Obesity    Osteoarthritis    left hip   Pneumonia    PONV (postoperative nausea and vomiting)    slow to wake up   Sleep apnea    does not wear CPAP   Stroke (HCC) 08/11/2020   spinal cord stroke   Wears glasses     Past Surgical History: Past Surgical History:  Procedure Laterality Date   ABDOMINAL HYSTERECTOMY     APPENDECTOMY  BACK SURGERY     BILIOPANCREATIC DIVISION W DUODENAL SWITCH N/A    BREAST BIOPSY Left 12/06/2022   u/s bx,1:00 heart clip, path pending   BREAST BIOPSY Left 12/06/2022   [name] bx 2:00 ribbon clip path pending   BREAST BIOPSY Left 12/06/2022   [name] LT BREAST BX W LOC DEV EA ADD LESION IMG BX SPEC US GUIDE 12/06/2022 [facility]   BREAST BIOPSY Left 12/06/2022   [name] LT BREAST BX W LOC DEV 1ST LESION IMG BX SPEC US GUIDE 12/06/2022 [facility]   BREAST RECONSTRUCTION WITH PLACEMENT OF TISSUE EXPANDER AND FLEX HD (ACELLULAR HYDRATED DERMIS) Bilateral 01/18/2023   Procedure: IMMEDIATE LEFT BREAST RECONSTRUCTION WITH PLACEMENT OF TISSUE EXPANDER AND FLEX HD (ACELLULAR HYDRATED DERMIS);  Surgeon: Peggye Form, DO;  Location: [facility];  Service: Plastics;  Laterality: Bilateral;   CHOLECYSTECTOMY     COLONOSCOPY N/A 06/27/2021   Procedure: COLONOSCOPY;  Surgeon: Jaynie Collins, DO;  Location: [facility] ENDOSCOPY;  Service: Gastroenterology;  Laterality: N/A;   DIAGNOSTIC LAPAROSCOPY     LOA   ESOPHAGOGASTRODUODENOSCOPY N/A 06/27/2021   Procedure: ESOPHAGOGASTRODUODENOSCOPY (EGD);  Surgeon: Jaynie Collins, DO;  Location: [facility] ENDOSCOPY;  Service: Gastroenterology;  Laterality: N/A;   ESOPHAGOGASTRODUODENOSCOPY (EGD) WITH PROPOFOL N/A 07/25/2017   Procedure: ESOPHAGOGASTRODUODENOSCOPY (EGD) WITH PROPOFOL;  Surgeon: Scot Jun, MD;  Location: [facility] ENDOSCOPY;  Service: Endoscopy;  Laterality: N/A;   KNEE ARTHROSCOPY     LAPAROSCOPIC GASTRIC SLEEVE RESECTION WITH HIATAL  HERNIA REPAIR     LUMBAR FUSION  08/29/2022   Revision Lumbar two through five Laminectomy & Fusion with Transforaminal Lumbar interbody fusion   MASTECTOMY W/ SENTINEL NODE BIOPSY Bilateral 01/18/2023   Procedure: MASTECTOMY WITH SENTINEL LYMPH NODE BIOPSY, RNFA to assist;  Surgeon: Leafy Ro, MD;  Location: [facility];  Service: General;  Laterality: Bilateral;   TONSILLECTOMY     TOTAL HIP ARTHROPLASTY Left 01/26/2016   Procedure: LEFT TOTAL HIP ARTHROPLASTY ANTERIOR APPROACH;  Surgeon: Tarry Kos, MD;  Location: [facility];  Service: Orthopedics;  Laterality: Left;   TRANSFORAMINAL LUMBAR INTERBODY FUSION (TLIF) WITH PEDICLE SCREW FIXATION 3 LEVEL  08/2019   Vira Browns, MD L2-L5    Social History: Social History   Socioeconomic History   Marital status: Divorced    Spouse name: Not on file   Number of children: 1   Years of education: Not on file   Highest education level: Not on file  Occupational History   Not on file  Tobacco Use   Smoking status: Never    Passive exposure: Never   Smokeless tobacco: Never  Vaping Use   Vaping Use: Never used  Substance and Sexual Activity   Alcohol use: Yes    Comment: occasional wine   Drug use: No   Sexual activity: Not Currently  Other Topics Concern   Not on file  Social History Narrative   Right handed    Uses cane to walk    Son has Medical and legal POA.   Social Determinants of Health   Financial Resource Strain: Medium Risk (12/11/2022)   Overall Financial Resource Strain (CARDIA)    Difficulty of Paying Living Expenses: Somewhat hard  Food Insecurity: No Food Insecurity (01/18/2023)   Hunger Vital Sign    Worried About Running Out of Food in the Last Year: Never true    Ran Out of Food in the Last Year: Never true  Recent Concern: Food Insecurity - Food Insecurity Present (12/08/2022)   Hunger Vital  Sign    Worried About Programme researcher, broadcasting/film/video in the Last Year: Often true    Ran Out of Food in the Last Year: Often true   Transportation Needs: No Transportation Needs (01/18/2023)   PRAPARE - Administrator, Civil Service (Medical): No    Lack of Transportation (Non-Medical): No  Physical Activity: Inactive (12/11/2022)   Exercise Vital Sign    Days of Exercise per Week: 0 days    Minutes of Exercise per Session: 0 min  Stress: Stress Concern Present (12/11/2022)   [employer] of Occupational Health - Occupational Stress Questionnaire    Feeling of Stress : To some extent  Social Connections: Moderately Isolated (12/11/2022)   Social Connection and Isolation Panel [NHANES]    Frequency of Communication with Friends and Family: Three times a week    Frequency of Social Gatherings with Friends and Family: Twice a week    Attends Religious Services: 1 to 4 times per year    Active Member of [employer] or Organizations: No    Attends Banker Meetings: Never    Marital Status: Divorced  Catering manager Violence: Not At Risk (01/18/2023)   Humiliation, Afraid, Rape, and Kick questionnaire    Fear of Current or Ex-Partner: No    Emotionally Abused: No    Physically Abused: No    Sexually Abused: No    Family History: Family History  Problem Relation Age of Onset   Renal Disease Mother    Hypertension Mother    Sudden Cardiac Death Mother    Heart failure Mother    Valvular heart disease Mother    Heart disease Mother    Breast cancer Maternal Grandmother    Stroke Brother    Heart attack Brother 83   Diabetes Other     Review of Systems: Review of Systems  Respiratory: Negative.    Cardiovascular: Negative.   Gastrointestinal: Negative.   Musculoskeletal: Negative.   Neurological: Negative.     Physical Exam: Vital Signs BP (!) 145/85   Pulse (!) 38   Physical Exam Constitutional:      General: Not in acute distress.    Appearance: Normal appearance. Not ill-appearing.  HENT:     Head: Normocephalic and atraumatic.  Eyes:     Pupils: Pupils are equal,  round Neck:     Musculoskeletal: Normal range of motion.  Cardiovascular:     Rate and Rhythm: Normal rate    Pulses: Normal pulses.  Pulmonary:     Effort: Pulmonary effort is normal. No respiratory distress.  Abdominal:     General: Abdomen is flat. There is no distension.  Musculoskeletal: Normal range of motion.  Skin:    General: Skin is warm and dry.     Findings: No erythema or rash.  Neurological:     General: No focal deficit present.     Mental Status: Alert and oriented to person, place, and time. Mental status is at baseline.     Motor: No weakness.  Psychiatric:        Mood and Affect: Mood normal.        Behavior: Behavior normal.    Assessment/Plan: The patient is scheduled for exchange of left breast tissue expander for new expander with Dr. Ulice Bold.  Risks, benefits, and alternatives of procedure discussed, questions answered and consent obtained.    Smoking Status: Non-smoker; Counseling Given?  N/A I  Caprini Score: 7, high; Risk Factors include: Age age, currently on tamoxifen,  history of cancer, BMI > 25, and length of planned surgery. Recommendation for mechanical and possible pharmacological prophylaxis. Encourage early ambulation.   Pictures previously obtained.  Post-op Rx sent to pharmacy: Oxycodone, Valium, Keflex  Patient patient was previously provided with the General surgical risk consent form and expander consent form.  She reports that she is aware of the risks related to exchanging the expander for new expander.  She underwent bilateral mastectomy with immediate bilateral breast reconstruction on 01/18/2023.  We discussed the risks again today and she is in agreement to proceed with surgery.  The risks that can be encountered with and after placement of a breast expander placement were discussed and include the following but not limited to these: bleeding, infection, delayed healing, anesthesia risks, skin sensation changes, injury to  structures including nerves, blood vessels, and muscles which may be temporary or permanent, allergies to tape, suture materials and glues, blood products, topical preparations or injected agents, skin contour irregularities, skin discoloration and swelling, deep vein thrombosis, cardiac and pulmonary complications, pain, which may persist, fluid accumulation, wrinkling of the skin over the expander, changes in nipple or breast sensation, expander leakage or rupture, faulty position of the expander, persistent pain, formation of tight scar tissue around the expander (capsular contracture), possible need for revisional surgery or staged procedures.  We discussed holding tamoxifen 2 weeks prior to and 2 weeks after surgery to decrease risk of VTE.  Spoke with Dr. Alena Bills via MyChart, she confirmed holding tamoxifen prior to and after surgery.  Patient reports she has held aspirin 5 to 7 days prior to surgery in the past and is planning to do that again as well.   Electronically signed by: Kermit Balo Scheeler, PA-C 03/14/2023 12:55 PM

## 2023-03-15 ENCOUNTER — Other Ambulatory Visit: Payer: Self-pay

## 2023-03-15 ENCOUNTER — Encounter (HOSPITAL_BASED_OUTPATIENT_CLINIC_OR_DEPARTMENT_OTHER): Payer: Self-pay | Admitting: Plastic Surgery

## 2023-03-15 NOTE — Progress Notes (Signed)
   03/15/23 1219  PAT Phone Screen  Is the patient taking a GLP-1 receptor agonist? No  Do You Have Diabetes? No  Do You Have Hypertension? (S)  Yes (Previous now low)  Have You Ever Been to the ER for Asthma? No  Have You Taken Oral Steroids in the Past 3 Months? Yes  Do you Take Phenteramine or any Other Diet Drugs? No  Recent  Lab Work, EKG, CXR? Yes  Where was this test performed? EKG 01/22/23, Echo 09/2021  Do you have a history of heart problems? Yes  Cardiologist Name (S)  Dr. Jacinto Halim (has an appointment on 7/17 will get clarance then and ASA hold)  Have you ever had tests on your heart? Yes  What cardiac tests were performed? Echo;EKG;Labs  Results viewable:  (results review)  Height 5\' 4"  (1.626 m)  Weight 115.2 kg  Pat Appointment Scheduled No  Reason for No Appointment Not Needed

## 2023-03-21 ENCOUNTER — Encounter
Payer: Medicaid Other | Attending: Physical Medicine and Rehabilitation | Admitting: Physical Medicine and Rehabilitation

## 2023-03-21 ENCOUNTER — Ambulatory Visit: Payer: Medicaid Other | Admitting: Cardiology

## 2023-03-21 ENCOUNTER — Encounter: Payer: Self-pay | Admitting: Cardiology

## 2023-03-21 ENCOUNTER — Encounter: Payer: Self-pay | Admitting: Physical Medicine and Rehabilitation

## 2023-03-21 VITALS — BP 143/83 | HR 81 | Ht 64.0 in | Wt 255.0 lb

## 2023-03-21 VITALS — BP 134/81 | HR 81 | Resp 15 | Ht 64.0 in | Wt 256.0 lb

## 2023-03-21 DIAGNOSIS — M7918 Myalgia, other site: Secondary | ICD-10-CM | POA: Insufficient documentation

## 2023-03-21 DIAGNOSIS — R030 Elevated blood-pressure reading, without diagnosis of hypertension: Secondary | ICD-10-CM

## 2023-03-21 DIAGNOSIS — G8220 Paraplegia, unspecified: Secondary | ICD-10-CM | POA: Diagnosis present

## 2023-03-21 DIAGNOSIS — C50812 Malignant neoplasm of overlapping sites of left female breast: Secondary | ICD-10-CM | POA: Diagnosis present

## 2023-03-21 DIAGNOSIS — E7800 Pure hypercholesterolemia, unspecified: Secondary | ICD-10-CM

## 2023-03-21 DIAGNOSIS — Z170 Estrogen receptor positive status [ER+]: Secondary | ICD-10-CM | POA: Diagnosis present

## 2023-03-21 DIAGNOSIS — R002 Palpitations: Secondary | ICD-10-CM

## 2023-03-21 DIAGNOSIS — G903 Multi-system degeneration of the autonomic nervous system: Secondary | ICD-10-CM

## 2023-03-21 NOTE — Progress Notes (Signed)
Primary Physician/Referring:  [name], MD  Patient ID: Vanessa Medina, female    DOB: [birth year], 52 y.o.   MRN: [id number]  Chief Complaint  Patient presents with   Dizziness   Coronary Artery Disease   Follow-up    1 year    HPI:    TIONNA GIGANTE  is a 52 y.o. with chronic dizziness, morbid obesity, history of gastric sleeve procedure in 2015 and revision in 2019, postural orthostatic tachycardia syndrome, however review of her record shows patient does not have POTS rather patient only has inappropriate sinus tachycardia component of POTS.  She underwent double mastectomy in May 2024 for breast cancer.  Since being on high dose of vitamin B12, B6 and B1, also since her back surgery and improvement in pain, symptoms of palpitations have resolved infectious noticed her blood pressure to be elevated.  She remains asymptomatic otherwise.  Past Medical History:  Diagnosis Date   Anemia    Anxiety    Cervical cancer (HCC)    Chronic kidney disease    Previous now clear   Depression    Family history of adverse reaction to anesthesia     " my mother takes a long time time wake up"   GERD (gastroesophageal reflux disease)    History of dysplastic nevus 01/18/2018   right shoulder, recurrent dysplastic nevus   History of kidney stones    History of shingles    Hypertension    Migraine    Multiple allergies    Obesity    Osteoarthritis    left hip   Pneumonia    PONV (postoperative nausea and vomiting)    slow to wake up   Sleep apnea    does not wear CPAP   Stroke (HCC) 08/11/2020   spinal cord stroke   Wears glasses    Past Surgical History:  Procedure Laterality Date   ABDOMINAL HYSTERECTOMY     APPENDECTOMY     BACK SURGERY     BILIOPANCREATIC DIVISION W DUODENAL SWITCH N/A    BREAST BIOPSY Left 12/06/2022   u/s bx,1:00 heart clip, path pending   BREAST BIOPSY Left 12/06/2022   [name] bx 2:00 ribbon clip path pending   BREAST BIOPSY Left 12/06/2022    [name] LT BREAST BX W LOC DEV EA ADD LESION IMG BX SPEC US GUIDE 12/06/2022 [facility]   BREAST BIOPSY Left 12/06/2022   [name] LT BREAST BX W LOC DEV 1ST LESION IMG BX SPEC US GUIDE 12/06/2022 [facility]   BREAST RECONSTRUCTION WITH PLACEMENT OF TISSUE EXPANDER AND FLEX HD (ACELLULAR HYDRATED DERMIS) Bilateral 01/18/2023   Procedure: IMMEDIATE LEFT BREAST RECONSTRUCTION WITH PLACEMENT OF TISSUE EXPANDER AND FLEX HD (ACELLULAR HYDRATED DERMIS);  Surgeon: Peggye Form, DO;  Location: [facility];  Service: Plastics;  Laterality: Bilateral;   CHOLECYSTECTOMY     COLONOSCOPY N/A 06/27/2021   Procedure: COLONOSCOPY;  Surgeon: Jaynie Collins, DO;  Location: [facility] ENDOSCOPY;  Service: Gastroenterology;  Laterality: N/A;   DIAGNOSTIC LAPAROSCOPY     LOA   ESOPHAGOGASTRODUODENOSCOPY N/A 06/27/2021   Procedure: ESOPHAGOGASTRODUODENOSCOPY (EGD);  Surgeon: Jaynie Collins, DO;  Location: [facility] ENDOSCOPY;  Service: Gastroenterology;  Laterality: N/A;   ESOPHAGOGASTRODUODENOSCOPY (EGD) WITH PROPOFOL N/A 07/25/2017   Procedure: ESOPHAGOGASTRODUODENOSCOPY (EGD) WITH PROPOFOL;  Surgeon: Scot Jun, MD;  Location: [facility] ENDOSCOPY;  Service: Endoscopy;  Laterality: N/A;   KNEE ARTHROSCOPY     LAPAROSCOPIC GASTRIC SLEEVE RESECTION WITH HIATAL HERNIA REPAIR     LUMBAR FUSION  08/29/2022   Revision Lumbar two through five Laminectomy & Fusion with Transforaminal Lumbar interbody fusion   MASTECTOMY W/ SENTINEL NODE BIOPSY Bilateral 01/18/2023   Procedure: MASTECTOMY WITH SENTINEL LYMPH NODE BIOPSY, RNFA to assist;  Surgeon: Leafy Ro, MD;  Location: [facility];  Service: General;  Laterality: Bilateral;   TONSILLECTOMY     TOTAL HIP ARTHROPLASTY Left 01/26/2016   Procedure: LEFT TOTAL HIP ARTHROPLASTY ANTERIOR APPROACH;  Surgeon: Tarry Kos, MD;  Location: [facility];  Service: Orthopedics;  Laterality: Left;   TRANSFORAMINAL LUMBAR INTERBODY FUSION (TLIF) WITH PEDICLE SCREW FIXATION 3 LEVEL   08/2019   Vira Browns, MD L2-L5   Family History  Problem Relation Age of Onset   Renal Disease Mother    Hypertension Mother    Sudden Cardiac Death Mother    Heart failure Mother    Valvular heart disease Mother    Heart disease Mother    Breast cancer Maternal Grandmother    Stroke Brother    Heart attack Brother 31   Diabetes Other     Social History   Tobacco Use   Smoking status: Never    Passive exposure: Never   Smokeless tobacco: Never  Substance Use Topics   Alcohol use: Yes    Comment: occasional wine   Marital Status: Divorced   ROS  Review of Systems  Cardiovascular:  Negative for chest pain, dyspnea on exertion and leg swelling.   Objective  Blood pressure 134/81, pulse 81, resp. rate 15, height 5\' 4"  (1.626 m), weight 256 lb (116.1 kg), SpO2 98%. Body mass index is 43.94 kg/m.      03/21/2023    1:49 PM 03/21/2023   12:01 PM 03/15/2023   12:19 PM  Vitals with BMI  Height 5\' 4"  5\' 4"  5\' 4"   Weight 256 lbs 255 lbs 254 lbs  BMI 43.92 43.75 43.58  Systolic 134 143   Diastolic 81 83   Pulse 81 81    Orthostatic VS for the past 72 hrs (Last 3 readings):  Orthostatic BP Patient Position BP Location Cuff Size Orthostatic Pulse  03/21/23 1352 134/81 Standing Left Arm Large 71  03/21/23 1351 138/85 Sitting Left Arm Large 71  03/21/23 1350 152/86 Supine Left Arm Large 82    Physical Exam Constitutional:      Appearance: She is obese.  Neck:     Vascular: No carotid bruit or JVD.  Cardiovascular:     Rate and Rhythm: Normal rate and regular rhythm.     Pulses: Intact distal pulses.     Heart sounds: Normal heart sounds. No murmur heard.    No gallop.  Pulmonary:     Effort: Pulmonary effort is normal.     Breath sounds: Normal breath sounds.  Abdominal:     General: Bowel sounds are normal.     Palpations: Abdomen is soft.  Musculoskeletal:     Right lower leg: No edema.     Left lower leg: No edema.    Laboratory examination:   Recent  Labs    12/08/22 1237 01/18/23 1858 01/19/23 0533  NA 137  --  142  K 3.8  --  3.8  CL 110  --  114*  CO2 23  --  22  GLUCOSE 86  --  97  BUN 26*  --  18  CREATININE 1.09* 0.99 1.01*  CALCIUM 8.7*  --  8.6*  GFRNONAA >60 >60 >60       Latest Ref Rng &  Units 01/19/2023    5:33 AM 01/18/2023    6:58 PM 12/08/2022   12:37 PM  CMP  Glucose 70 - 99 mg/dL 97   86   BUN 6 - 20 mg/dL 18   26   Creatinine 4.09 - 1.00 mg/dL 8.11  9.14  7.82   Sodium 135 - 145 mmol/L 142   137   Potassium 3.5 - 5.1 mmol/L 3.8   3.8   Chloride 98 - 111 mmol/L 114   110   CO2 22 - 32 mmol/L 22   23   Calcium 8.9 - 10.3 mg/dL 8.6   8.7   Total Protein 6.5 - 8.1 g/dL   7.8   Total Bilirubin 0.3 - 1.2 mg/dL   0.4   Alkaline Phos 38 - 126 U/L   123   AST 15 - 41 U/L   19   ALT 0 - 44 U/L   15       Latest Ref Rng & Units 01/19/2023    5:33 AM 01/18/2023    6:58 PM 12/08/2022   12:37 PM  CBC  WBC 4.0 - 10.5 K/uL 6.8  11.3  4.7   Hemoglobin 12.0 - 15.0 g/dL 95.6  21.3  08.6   Hematocrit 36.0 - 46.0 % 32.6  38.0  37.6   Platelets 150 - 400 K/uL 246  297  295     Lipid Panel  Lipid profile 03/23/2021:  Total cholesterol 226, triglycerides 110, HDL 67, LDL 140  HEMOGLOBIN A1C Lab Results  Component Value Date   HGBA1C 5.1 06/24/2013   TSH Recent Labs    12/08/22 1237  TSH 1.605    Radiology:   No results found.  Cardiac Studies:   CORONARY CALCIUM SCORES 09/28/2021:  Left Main: 0  LAD: 0  LCx: 0  RCA: 0  Total Agatston Score: 0  MESA database percentile: 0   AORTA MEASUREMENTS:  Ascending Aorta: 25 mm  Descending Aorta: 22 mm  Ambulatory cardiac telemetry 14 days (09/12/2021 - 09/26/2021): Predominant underlying rhythm was sinus.  Minimum heart rate 52 bpm, maximum heart rate 150 bpm, average heart rate 77 bpm.  Rare PACs and PVCs.  Reported SVT episode more consistent with sinus tachycardia.  Patient's symptoms correlated with PACs and sinus rhythm 70-123 bpm.  No evidence of  atrial fibrillation, high degree AV block, pauses >3 seconds, VT, or SVT.  Echocardiogram 09/28/2021:  Normal LV systolic function with visual EF 60-65%. Left ventricle cavity  is normal in size. Normal left ventricular wall thickness. Normal global  wall motion. Normal diastolic filling pattern, normal LAP.  No significant valvular heart disease.  Compared to study 08/14/2018 no significant change.   EKG:   EKG 03/20/2022: Normal sinus rhythm at rate of 62 bpm, normal axis, poor R progression, probably normal variant.  No evidence of ischemia, normal EKG.  No change from 03/20/2033.   Allergies & Medications   Allergies  Allergen Reactions   Bee Venom Swelling   Latex Hives and Rash   Morphine Other (See Comments)    BRADYCARDIA   Shellfish Allergy Anaphylaxis   Codeine Nausea And Vomiting   Meperidine Hcl Other (See Comments)    BRADYCARDIA   Bee Pollen Other (See Comments)    Unknown   Iodinated Contrast Media Other (See Comments)    Swelling  Other reaction(s): Other (See Comments)  Swelling   Swelling   Oxycodone Hives and Itching    Blisters on back   Tape  Hives    Adhesive   Pentazocine Lactate Nausea And Vomiting    REACTION: vomiting with Talwin NX   Propoxyphene Nausea Only and Nausea And Vomiting    Current Outpatient Medications:    azelastine (ASTELIN) 0.1 % nasal spray, Place into the nose., Disp: , Rfl:    baclofen (LIORESAL) 10 MG tablet, Take 1 tablet (10 mg total) by mouth 3 (three) times daily. - for spasms, Disp: 270 tablet, Rfl: 3   diazepam (VALIUM) 2 MG tablet, Take 1 tablet (2 mg total) by mouth every 12 (twelve) hours as needed for up to 5 doses for muscle spasms., Disp: 5 tablet, Rfl: 0   diazepam (VALIUM) 2 MG tablet, Take 1 tablet (2 mg total) by mouth every 12 (twelve) hours as needed for muscle spasms., Disp: 10 tablet, Rfl: 0   diclofenac Sodium (VOLTAREN) 1 % GEL, APPLY 4 GRAMS TO AFFECTED  AREA(S) TOPICALLY 4 TIMES DAILY (Patient taking  differently: as needed.), Disp: 1500 g, Rfl: 1   EPINEPHrine 0.3 mg/0.3 mL IJ SOAJ injection, , Disp: , Rfl:    fluticasone (FLONASE) 50 MCG/ACT nasal spray, Place 1-2 sprays into both nostrils daily., Disp: , Rfl:    gabapentin (NEURONTIN) 300 MG capsule, Take 1 capsule (300 mg total) by mouth at bedtime. For nerve pain, Disp: 90 capsule, Rfl: 3   montelukast (SINGULAIR) 10 MG tablet, TAKE 1 TABLET BY MOUTH AT  BEDTIME FOR ALLERGIES, Disp: 30 tablet, Rfl: 1   Multiple Vitamin (MULTIVITAMIN WITH MINERALS) TABS tablet, Take 1 tablet by mouth 2 (two) times daily., Disp: , Rfl:    MYRBETRIQ 25 MG TB24 tablet, Take 25 mg by mouth daily., Disp: , Rfl:    pramipexole (MIRAPEX) 0.125 MG tablet, Take by mouth., Disp: , Rfl:    pyridOXINE (VITAMIN B-6) 100 MG tablet, Take 1 tablet (100 mg total) by mouth daily., Disp: 90 tablet, Rfl: 1   tamoxifen (NOLVADEX) 20 MG tablet, Take 1 tablet (20 mg total) by mouth daily., Disp: 90 tablet, Rfl: 0   Thiamine HCl (VITAMIN B-1) 250 MG tablet, Take 1 tablet (250 mg total) by mouth daily., Disp: 90 tablet, Rfl: 1   topiramate (TOPAMAX) 100 MG tablet, TAKE 1 TABLET BY MOUTH AT  BEDTIME, Disp: 90 tablet, Rfl: 3   vitamin B-12 (CYANOCOBALAMIN) 1000 MCG tablet, Take 1 tablet (1,000 mcg total) by mouth daily., Disp: 90 tablet, Rfl: 1   fludrocortisone (FLORINEF) 0.1 MG tablet, Take 1 tablet (0.1 mg total) by mouth as directed. 1 tablet daily x 3 weeks, 1 tablet every other day x 3 weeks, 1 tablet every third day x 3 weeks then stop, Disp: , Rfl:    midodrine (PROAMATINE) 10 MG tablet, Take 1 tablet (10 mg total) by mouth 3 (three) times daily as needed (For dizziness and SBP <110/min). For orthostatic hypotension, Disp: , Rfl:    Assessment     ICD-10-CM   1. Neurogenic orthostatic hypotension (HCC)  G90.3 EKG 12-Lead    fludrocortisone (FLORINEF) 0.1 MG tablet    midodrine (PROAMATINE) 10 MG tablet    2. Palpitations  R00.2     3. Mild hypercholesterolemia  E78.00      4. Elevated BP without diagnosis of hypertension  R03.0        Medications Discontinued During This Encounter  Medication Reason   celecoxib (CELEBREX) 200 MG capsule    DULoxetine (CYMBALTA) 60 MG capsule    methocarbamol (ROBAXIN) 500 MG tablet    fludrocortisone (FLORINEF) 0.1  MG tablet    midodrine (PROAMATINE) 10 MG tablet    aspirin EC 81 MG tablet Discontinued by provider     No orders of the defined types were placed in this encounter.   Orders Placed This Encounter  Procedures   EKG 12-Lead   Recommendations:   PATSYE SULLIVANT is a 52 y.o. with chronic dizziness, morbid obesity, history of gastric sleeve procedure in 2015 and revision in 2019, postural orthostatic tachycardia syndrome, however review of her record shows patient does not have POTS rather patient only has inappropriate sinus tachycardia component of POTS.  She underwent double mastectomy in May 2024 for breast cancer.  1. Neurogenic orthostatic hypotension (HCC) Patient's orthostatic hypotension symptoms are essentially resolved, in fact she is on high dose of fludrocortisone at 0.2 mg daily, I will slowly wean this off.  She can continue with midodrine as needed on a 3 times daily basis for systolic blood pressure <110 mmHg or if she has dizziness.  She was started on high doses of vitamin supplements and I suspect this could have potentially helped her with both the episodes of palpitations, orthostatic hypotension and inappropriate sinus tachycardia.  She will continue vitamin supplements for now.  - EKG 12-Lead - fludrocortisone (FLORINEF) 0.1 MG tablet; Take 1 tablet (0.1 mg total) by mouth as directed. 1 tablet daily x 3 weeks, 1 tablet every other day x 3 weeks, 1 tablet every third day x 3 weeks then stop - midodrine (PROAMATINE) 10 MG tablet; Take 1 tablet (10 mg total) by mouth 3 (three) times daily as needed (For dizziness and SBP <110/min). For orthostatic hypotension  2. Palpitations With  heart palpitations, symptoms are resolved.  3. Mild hypercholesterolemia She does have mild hypercholesterolemia however her coronary calcium score is 0.  She does not have hypertension or diabetes mellitus and she is a non-smoker.  Her only risk factor is obesity.  Hence we could continue to watch this for now and I do not think she needs statin therapy and she is also losing weight with regular exercise as well.  4. Elevated BP without diagnosis of hypertension With chemotherapy, and stabilization of her sinus tachycardia/inappropriate sinus tachycardia, orthostasis, her blood pressure is elevated.  Will slowly wean her off of 4 cortisone and continue midodrine only.  I suspect if she has no further dizziness she could potentially discontinue midodrine as well.  She will continue with high intensity vitamins for now and she could potentially gradually come off of all the vitamins for time.  Otherwise stable from cardiac standpoint, I will see her back on a as needed basis.    Yates Decamp, MD, [facility] 03/21/2023, 2:40 PM Office: [phone] Fax: [phone] Pager: [phone]

## 2023-03-21 NOTE — Patient Instructions (Signed)
Plan: Cleaned areas with alcohol and injected using a 27 gauge 1.5 inch needle  Injected 4.5 cc- 1.5 cc wasted Using 1% Lidocaine with no EPI  Upper traps B/L  Levators B/L  Posterior scalenes Middle scalenes Splenius Capitus Pectoralis Major- can never do due to expanders for breast CA Rhomboids B/L x3 Infraspinatus Teres Major/minor Thoracic paraspinals Lumbar paraspinals- B/L Other injections-     There was no bleeding or complications.  Patient was advised to drink a lot of water on day after injections to flush system Will have increased soreness for 12-48 hours after injections.  Can use Lidocaine patches the day AFTER injections Can use theracane on day of injections in places didn't inject Can use heating pad 4-6 hours AFTER injections   2. Con't baclofen and gabapentin- midrocine and singulair.    3. Con't Topamax  4. F/U - q2 months TrP injections

## 2023-03-21 NOTE — Progress Notes (Signed)
Pt is a 52 yr old female with paraparesis/incomplete paraplegia due to attempted spinal cord stimulator placement- With chronic hx of orthostasis, BMI of 41 - back dwon from 45-  s/p gastric sleeve, Acute on chronic renal issues- CKD stage II,  here for f/u on SCI and neurogenic orthostatic hypotension. Vestibular issues due to trigger points and central issues.  Also had surgery on back in December 2023- L2-L3 laminectomy- at [facility]- Dr Sharolyn Douglas- Spine and Scoliosis- in [place], is Ortho spine per pt.  Here for f/u on ASIA D incomplete paraplegia and chronic pain.    Has breast CA- has has double mastectomy- going back in for surgery again for replacing expander- it's popped.  TO be done at [facility].    Celebrex- was wondering who got it from-  Dr Otelia Sergeant- asked to come off Celebrex due to Tamixifan. For 5 years.   Getting up and walking everyday- 2-3 miles/day- takes time to do.    Decided after talking about trigger point injections.   Is so tight in neck and shoulders- and upper back as well -  Had massage before dx'd with breast CA-   Thinks everything was doing well- until dx'd with breast CA. Dx'd with mammogram.   3 tumors in L breast and R was clear, - did mastectomy in case.   Sees cardiology again today- due ot low BP.  And high BP lately.   Patient here for trigger point injections for  Consent done and on chart.  Plan: Cleaned areas with alcohol and injected using a 27 gauge 1.5 inch needle  Injected 4.5 cc- 1.5 cc wasted Using 1% Lidocaine with no EPI  Upper traps B/L  Levators B/L  Posterior scalenes Middle scalenes Splenius Capitus Pectoralis Major- can never do due to expanders for breast CA Rhomboids B/L x3 Infraspinatus Teres Major/minor Thoracic paraspinals Lumbar paraspinals- B/L Other injections-     There was no bleeding or complications.  Patient was advised to drink a lot of water on day after injections to flush system Will  have increased soreness for 12-48 hours after injections.  Can use Lidocaine patches the day AFTER injections Can use theracane on day of injections in places didn't inject Can use heating pad 4-6 hours AFTER injections   2. Con't baclofen and gabapentin- midrocine and singulair.    3. Con't Topamax  4. F/U - q2 months TrP injections

## 2023-03-24 ENCOUNTER — Encounter (HOSPITAL_BASED_OUTPATIENT_CLINIC_OR_DEPARTMENT_OTHER): Payer: Self-pay | Admitting: Plastic Surgery

## 2023-03-24 NOTE — Anesthesia Preprocedure Evaluation (Addendum)
Anesthesia Evaluation  Patient identified by MRN, date of birth, ID band Patient awake    Reviewed: Allergy & Precautions, NPO status , Patient's Chart, lab work & pertinent test results  History of Anesthesia Complications (+) PONV, Family history of anesthesia reaction and history of anesthetic complications  Airway Mallampati: II  TM Distance: >3 FB     Dental  (+) Missing, Caps, Dental Advisory Given   Pulmonary asthma , sleep apnea , pneumonia, resolved   Pulmonary exam normal breath sounds clear to auscultation       Cardiovascular hypertension, Normal cardiovascular exam Rhythm:Regular Rate:Normal     Neuro/Psych  Headaches PSYCHIATRIC DISORDERS Anxiety Depression    Hx/o spinal cord stroke -right paraparesis abnormality of gait  Neuromuscular disease CVA, Residual Symptoms    GI/Hepatic Neg liver ROS,GERD  Medicated,,S/P Bariatric surgery   Endo/Other    Morbid obesityHx/o Breast Ca s/p bilateral mastectomies Left breast implant rupture  Renal/GU Renal InsufficiencyRenal disease  negative genitourinary   Musculoskeletal  (+) Arthritis , Osteoarthritis,    Abdominal  (+) + obese  Peds  Hematology  (+) Blood dyscrasia, anemia Pernicious anemia   Anesthesia Other Findings   Reproductive/Obstetrics Hx/o cervical Ca                              Anesthesia Physical Anesthesia Plan  ASA: 3  Anesthesia Plan: General   Post-op Pain Management: Precedex, Lidocaine infusion* and Tylenol PO (pre-op)*   Induction: Intravenous  PONV Risk Score and Plan: 4 or greater and Treatment may vary due to age or medical condition, Midazolam, Scopolamine patch - Pre-op, Ondansetron, Dexamethasone, TIVA and Diphenhydramine  Airway Management Planned: Oral ETT  Additional Equipment: None  Intra-op Plan:   Post-operative Plan: Extubation in OR  Informed Consent: I have reviewed the patients  History and Physical, chart, labs and discussed the procedure including the risks, benefits and alternatives for the proposed anesthesia with the patient or authorized representative who has indicated his/her understanding and acceptance.     Dental advisory given  Plan Discussed with: CRNA and Anesthesiologist  Anesthesia Plan Comments:          Anesthesia Quick Evaluation

## 2023-03-26 ENCOUNTER — Ambulatory Visit (HOSPITAL_BASED_OUTPATIENT_CLINIC_OR_DEPARTMENT_OTHER): Payer: Medicaid Other | Admitting: Anesthesiology

## 2023-03-26 ENCOUNTER — Encounter (HOSPITAL_BASED_OUTPATIENT_CLINIC_OR_DEPARTMENT_OTHER)
Admission: RE | Disposition: A | Discharge status: Home / Self Care | Payer: Self-pay | Source: Home / Self Care | Attending: Plastic Surgery

## 2023-03-26 ENCOUNTER — Encounter (HOSPITAL_BASED_OUTPATIENT_CLINIC_OR_DEPARTMENT_OTHER): Payer: Self-pay | Admitting: Plastic Surgery

## 2023-03-26 ENCOUNTER — Other Ambulatory Visit: Payer: Self-pay

## 2023-03-26 ENCOUNTER — Ambulatory Visit (HOSPITAL_BASED_OUTPATIENT_CLINIC_OR_DEPARTMENT_OTHER)
Admission: RE | Admit: 2023-03-26 | Discharge: 2023-03-26 | Disposition: A | Discharge status: Home / Self Care | Payer: Medicaid Other | Attending: Plastic Surgery | Admitting: Plastic Surgery

## 2023-03-26 DIAGNOSIS — F419 Anxiety disorder, unspecified: Secondary | ICD-10-CM | POA: Diagnosis not present

## 2023-03-26 DIAGNOSIS — T8543XA Leakage of breast prosthesis and implant, initial encounter: Secondary | ICD-10-CM | POA: Insufficient documentation

## 2023-03-26 DIAGNOSIS — R269 Unspecified abnormalities of gait and mobility: Secondary | ICD-10-CM | POA: Insufficient documentation

## 2023-03-26 DIAGNOSIS — I120 Hypertensive chronic kidney disease with stage 5 chronic kidney disease or end stage renal disease: Secondary | ICD-10-CM | POA: Diagnosis not present

## 2023-03-26 DIAGNOSIS — I10 Essential (primary) hypertension: Secondary | ICD-10-CM | POA: Diagnosis not present

## 2023-03-26 DIAGNOSIS — J45909 Unspecified asthma, uncomplicated: Secondary | ICD-10-CM | POA: Diagnosis not present

## 2023-03-26 DIAGNOSIS — K219 Gastro-esophageal reflux disease without esophagitis: Secondary | ICD-10-CM | POA: Diagnosis not present

## 2023-03-26 DIAGNOSIS — G4730 Sleep apnea, unspecified: Secondary | ICD-10-CM | POA: Diagnosis not present

## 2023-03-26 DIAGNOSIS — N611 Abscess of the breast and nipple: Secondary | ICD-10-CM | POA: Diagnosis not present

## 2023-03-26 DIAGNOSIS — N186 End stage renal disease: Secondary | ICD-10-CM | POA: Diagnosis not present

## 2023-03-26 DIAGNOSIS — G8220 Paraplegia, unspecified: Secondary | ICD-10-CM | POA: Insufficient documentation

## 2023-03-26 DIAGNOSIS — Z9884 Bariatric surgery status: Secondary | ICD-10-CM | POA: Insufficient documentation

## 2023-03-26 DIAGNOSIS — F32A Depression, unspecified: Secondary | ICD-10-CM | POA: Diagnosis not present

## 2023-03-26 DIAGNOSIS — Z853 Personal history of malignant neoplasm of breast: Secondary | ICD-10-CM

## 2023-03-26 DIAGNOSIS — Y831 Surgical operation with implant of artificial internal device as the cause of abnormal reaction of the patient, or of later complication, without mention of misadventure at the time of the procedure: Secondary | ICD-10-CM | POA: Insufficient documentation

## 2023-03-26 DIAGNOSIS — Z8541 Personal history of malignant neoplasm of cervix uteri: Secondary | ICD-10-CM | POA: Diagnosis not present

## 2023-03-26 DIAGNOSIS — Z6841 Body Mass Index (BMI) 40.0 and over, adult: Secondary | ICD-10-CM

## 2023-03-26 DIAGNOSIS — M1990 Unspecified osteoarthritis, unspecified site: Secondary | ICD-10-CM | POA: Diagnosis not present

## 2023-03-26 DIAGNOSIS — Z421 Encounter for breast reconstruction following mastectomy: Secondary | ICD-10-CM

## 2023-03-26 DIAGNOSIS — G43909 Migraine, unspecified, not intractable, without status migrainosus: Secondary | ICD-10-CM | POA: Diagnosis not present

## 2023-03-26 DIAGNOSIS — Z9013 Acquired absence of bilateral breasts and nipples: Secondary | ICD-10-CM | POA: Insufficient documentation

## 2023-03-26 DIAGNOSIS — D510 Vitamin B12 deficiency anemia due to intrinsic factor deficiency: Secondary | ICD-10-CM | POA: Insufficient documentation

## 2023-03-26 DIAGNOSIS — F40240 Claustrophobia: Secondary | ICD-10-CM

## 2023-03-26 HISTORY — DX: Other complications of anesthesia, initial encounter: T88.59XA

## 2023-03-26 HISTORY — DX: Claustrophobia: F40.240

## 2023-03-26 HISTORY — DX: Depression, unspecified: F32.A

## 2023-03-26 HISTORY — DX: Chronic kidney disease, unspecified: N18.9

## 2023-03-26 HISTORY — PX: BREAST RECONSTRUCTION WITH PLACEMENT OF TISSUE EXPANDER AND FLEX HD (ACELLULAR HYDRATED DERMIS): SHX6295

## 2023-03-26 SURGERY — REVISION, RECONSTRUCTION, BREAST
Anesthesia: General | Site: Chest | Laterality: Left

## 2023-03-26 MED ORDER — ROCURONIUM BROMIDE 10 MG/ML (PF) SYRINGE
PREFILLED_SYRINGE | INTRAVENOUS | Status: AC
  Filled 2023-03-26: qty 10

## 2023-03-26 MED ORDER — LIDOCAINE-EPINEPHRINE 1 %-1:100000 IJ SOLN
INTRAMUSCULAR | Status: DC | PRN
  Administered 2023-03-26: 4 mL

## 2023-03-26 MED ORDER — ONDANSETRON HCL 4 MG/2ML IJ SOLN
INTRAMUSCULAR | Status: DC | PRN
  Administered 2023-03-26: 4 mg via INTRAVENOUS

## 2023-03-26 MED ORDER — ACETAMINOPHEN 500 MG PO TABS
ORAL_TABLET | ORAL | Status: AC
  Filled 2023-03-26: qty 2

## 2023-03-26 MED ORDER — ROCURONIUM BROMIDE 10 MG/ML (PF) SYRINGE
PREFILLED_SYRINGE | INTRAVENOUS | Status: DC | PRN
  Administered 2023-03-26: 70 mg via INTRAVENOUS

## 2023-03-26 MED ORDER — DEXAMETHASONE SODIUM PHOSPHATE 10 MG/ML IJ SOLN
INTRAMUSCULAR | Status: AC
  Filled 2023-03-26: qty 1

## 2023-03-26 MED ORDER — DEXAMETHASONE SODIUM PHOSPHATE 10 MG/ML IJ SOLN
INTRAMUSCULAR | Status: DC | PRN
  Administered 2023-03-26: 10 mg via INTRAVENOUS

## 2023-03-26 MED ORDER — HYDROCODONE-ACETAMINOPHEN 7.5-325 MG PO TABS: 1.0000 | ORAL_TABLET | Freq: Once | ORAL | Status: DC | PRN

## 2023-03-26 MED ORDER — LIDOCAINE 2% (20 MG/ML) 5 ML SYRINGE
INTRAMUSCULAR | Status: DC | PRN
  Administered 2023-03-26: 100 mg via INTRAVENOUS

## 2023-03-26 MED ORDER — SODIUM CHLORIDE (PF) 0.9 % IJ SOLN
INTRAMUSCULAR | Status: AC
  Filled 2023-03-26: qty 10

## 2023-03-26 MED ORDER — SUGAMMADEX SODIUM 200 MG/2ML IV SOLN
INTRAVENOUS | Status: DC | PRN
  Administered 2023-03-26: 400 mg via INTRAVENOUS

## 2023-03-26 MED ORDER — BUPIVACAINE HCL (PF) 0.25 % IJ SOLN
INTRAMUSCULAR | Status: AC
  Filled 2023-03-26: qty 30

## 2023-03-26 MED ORDER — HYDROMORPHONE HCL 1 MG/ML IJ SOLN
INTRAMUSCULAR | Status: AC
  Filled 2023-03-26: qty 0.5

## 2023-03-26 MED ORDER — ACETAMINOPHEN 500 MG PO TABS
1000.0000 mg | ORAL_TABLET | Freq: Once | ORAL | Status: AC
  Administered 2023-03-26: 1000 mg via ORAL

## 2023-03-26 MED ORDER — CEFAZOLIN SODIUM-DEXTROSE 2-4 GM/100ML-% IV SOLN: 2.0000 g | INTRAVENOUS | Status: DC

## 2023-03-26 MED ORDER — PROPOFOL 500 MG/50ML IV EMUL
INTRAVENOUS | Status: AC
  Filled 2023-03-26: qty 50

## 2023-03-26 MED ORDER — SODIUM CHLORIDE 0.9 % IV SOLN
INTRAVENOUS | Status: AC
  Filled 2023-03-26: qty 10

## 2023-03-26 MED ORDER — DIPHENHYDRAMINE HCL 50 MG/ML IJ SOLN
25.0000 mg | Freq: Once | INTRAMUSCULAR | Status: AC
  Administered 2023-03-26: 25 mg via INTRAVENOUS

## 2023-03-26 MED ORDER — LIDOCAINE HCL (PF) 1 % IJ SOLN
INTRAMUSCULAR | Status: AC
  Filled 2023-03-26: qty 30

## 2023-03-26 MED ORDER — ONDANSETRON HCL 4 MG/2ML IJ SOLN: 4.0000 mg | Freq: Once | INTRAMUSCULAR | Status: DC | PRN

## 2023-03-26 MED ORDER — LIDOCAINE 2% (20 MG/ML) 5 ML SYRINGE
INTRAMUSCULAR | Status: AC
  Filled 2023-03-26: qty 5

## 2023-03-26 MED ORDER — ONDANSETRON HCL 4 MG/2ML IJ SOLN
INTRAMUSCULAR | Status: AC
  Filled 2023-03-26: qty 2

## 2023-03-26 MED ORDER — FENTANYL CITRATE (PF) 250 MCG/5ML IJ SOLN
INTRAMUSCULAR | Status: DC | PRN
  Administered 2023-03-26: 100 ug via INTRAVENOUS

## 2023-03-26 MED ORDER — SCOPOLAMINE 1 MG/3DAYS TD PT72
MEDICATED_PATCH | TRANSDERMAL | Status: AC
  Filled 2023-03-26: qty 1

## 2023-03-26 MED ORDER — BUPIVACAINE LIPOSOME 1.3 % IJ SUSP
INTRAMUSCULAR | Status: AC
  Filled 2023-03-26: qty 20

## 2023-03-26 MED ORDER — HYDROCODONE-ACETAMINOPHEN 7.5-325 MG/15ML PO SOLN
ORAL | Status: AC
  Filled 2023-03-26: qty 15

## 2023-03-26 MED ORDER — MIDAZOLAM HCL 2 MG/2ML IJ SOLN
INTRAMUSCULAR | Status: AC
  Filled 2023-03-26: qty 2

## 2023-03-26 MED ORDER — HYDROMORPHONE HCL 1 MG/ML IJ SOLN
0.2500 mg | INTRAMUSCULAR | Status: DC | PRN
  Administered 2023-03-26: 0.5 mg via INTRAVENOUS

## 2023-03-26 MED ORDER — AMISULPRIDE (ANTIEMETIC) 5 MG/2ML IV SOLN
INTRAVENOUS | Status: AC
  Filled 2023-03-26: qty 2

## 2023-03-26 MED ORDER — SCOPOLAMINE 1 MG/3DAYS TD PT72
1.0000 | MEDICATED_PATCH | TRANSDERMAL | Status: DC
  Administered 2023-03-26: 1.5 mg via TRANSDERMAL

## 2023-03-26 MED ORDER — CEFAZOLIN SODIUM-DEXTROSE 2-4 GM/100ML-% IV SOLN
INTRAVENOUS | Status: AC
  Filled 2023-03-26: qty 100

## 2023-03-26 MED ORDER — SODIUM CHLORIDE 0.9 % IV SOLN
INTRAVENOUS | Status: DC | PRN
  Administered 2023-03-26: 500 mL

## 2023-03-26 MED ORDER — PROPOFOL 500 MG/50ML IV EMUL
INTRAVENOUS | Status: DC | PRN
  Administered 2023-03-26: 150 ug/kg/min via INTRAVENOUS

## 2023-03-26 MED ORDER — CEFAZOLIN SODIUM-DEXTROSE 2-3 GM-%(50ML) IV SOLR
INTRAVENOUS | Status: DC | PRN
  Administered 2023-03-26: 2 g via INTRAVENOUS

## 2023-03-26 MED ORDER — DIPHENHYDRAMINE HCL 50 MG/ML IJ SOLN
INTRAMUSCULAR | Status: AC
  Filled 2023-03-26: qty 1

## 2023-03-26 MED ORDER — PROPOFOL 10 MG/ML IV BOLUS
INTRAVENOUS | Status: DC | PRN
  Administered 2023-03-26: 200 mg via INTRAVENOUS

## 2023-03-26 MED ORDER — EPINEPHRINE PF 1 MG/ML IJ SOLN
INTRAMUSCULAR | Status: AC
  Filled 2023-03-26: qty 1

## 2023-03-26 MED ORDER — HYDROCODONE-ACETAMINOPHEN 7.5-325 MG/15ML PO SOLN
15.0000 mL | Freq: Four times a day (QID) | ORAL | Status: DC | PRN
  Administered 2023-03-26: 15 mL via ORAL

## 2023-03-26 MED ORDER — CHLORHEXIDINE GLUCONATE CLOTH 2 % EX PADS: 6.0000 | MEDICATED_PAD | Freq: Once | CUTANEOUS | Status: DC

## 2023-03-26 MED ORDER — SODIUM CHLORIDE (PF) 0.9 % IJ SOLN
INTRAMUSCULAR | Status: DC | PRN
  Administered 2023-03-26: 1000 mL

## 2023-03-26 MED ORDER — LACTATED RINGERS IV SOLN: INTRAVENOUS | Status: DC

## 2023-03-26 MED ORDER — FENTANYL CITRATE (PF) 100 MCG/2ML IJ SOLN
INTRAMUSCULAR | Status: AC
  Filled 2023-03-26: qty 2

## 2023-03-26 MED ORDER — HYDROCODONE-ACETAMINOPHEN 7.5-325 MG/15ML PO SOLN: 10.0000 mL | Freq: Four times a day (QID) | ORAL | Status: DC | PRN

## 2023-03-26 MED ORDER — MIDAZOLAM HCL 2 MG/2ML IJ SOLN
INTRAMUSCULAR | Status: DC | PRN
  Administered 2023-03-26: 1 mg via INTRAVENOUS

## 2023-03-26 MED ORDER — AMISULPRIDE (ANTIEMETIC) 5 MG/2ML IV SOLN
10.0000 mg | Freq: Once | INTRAVENOUS | Status: AC | PRN
  Administered 2023-03-26: 10 mg via INTRAVENOUS

## 2023-03-26 SURGICAL SUPPLY — 65 items
ADH SKN CLS APL DERMABOND .7 (GAUZE/BANDAGES/DRESSINGS)
BAG DECANTER FOR FLEXI CONT (MISCELLANEOUS) ×1 IMPLANT
BINDER BREAST LRG (GAUZE/BANDAGES/DRESSINGS) IMPLANT
BINDER BREAST MEDIUM (GAUZE/BANDAGES/DRESSINGS) IMPLANT
BINDER BREAST XLRG (GAUZE/BANDAGES/DRESSINGS) IMPLANT
BINDER BREAST XXLRG (GAUZE/BANDAGES/DRESSINGS) IMPLANT
BIOPATCH RED 1 DISK 7.0 (GAUZE/BANDAGES/DRESSINGS) ×1 IMPLANT
BLADE HEX COATED 2.75 (ELECTRODE) ×1 IMPLANT
BLADE SURG 15 STRL LF DISP TIS (BLADE) ×1 IMPLANT
BLADE SURG 15 STRL SS (BLADE) ×1
BNDG GAUZE DERMACEA FLUFF 4 (GAUZE/BANDAGES/DRESSINGS) ×2 IMPLANT
BNDG GZE DERMACEA 4 6PLY (GAUZE/BANDAGES/DRESSINGS)
CANISTER SUCT 1200ML W/VALVE (MISCELLANEOUS) ×1 IMPLANT
COVER BACK TABLE 60X90IN (DRAPES) ×1 IMPLANT
COVER MAYO STAND STRL (DRAPES) ×1 IMPLANT
DERMABOND ADVANCED .7 DNX12 (GAUZE/BANDAGES/DRESSINGS) IMPLANT
DRAIN CHANNEL 19F RND (DRAIN) ×1 IMPLANT
DRAPE LAPAROSCOPIC ABDOMINAL (DRAPES) ×1 IMPLANT
DRESSING MEPILEX FLEX 4X4 (GAUZE/BANDAGES/DRESSINGS) IMPLANT
DRSG MEPILEX FLEX 4X4 (GAUZE/BANDAGES/DRESSINGS) ×1
DRSG OPSITE POSTOP 4X6 (GAUZE/BANDAGES/DRESSINGS) IMPLANT
ELECT BLADE 4.0 EZ CLEAN MEGAD (MISCELLANEOUS) ×1
ELECT REM PT RETURN 9FT ADLT (ELECTROSURGICAL) ×1
ELECTRODE BLDE 4.0 EZ CLN MEGD (MISCELLANEOUS) ×1 IMPLANT
ELECTRODE REM PT RTRN 9FT ADLT (ELECTROSURGICAL) ×1 IMPLANT
EVACUATOR SILICONE 100CC (DRAIN) ×1 IMPLANT
FUNNEL KELLER 2 DISP (MISCELLANEOUS) IMPLANT
GAUZE PAD ABD 8X10 STRL (GAUZE/BANDAGES/DRESSINGS) ×2 IMPLANT
GLOVE BIO SURGEON STRL SZ 6.5 (GLOVE) ×2 IMPLANT
GOWN STRL REUS W/ TWL LRG LVL3 (GOWN DISPOSABLE) ×3 IMPLANT
GOWN STRL REUS W/TWL LRG LVL3 (GOWN DISPOSABLE) ×2
IMPL EXPANDER BREAST 535CC (Breast) IMPLANT
IMPLANT EXPANDER BREAST 535CC (Breast) ×1 IMPLANT
IV NS 1000ML (IV SOLUTION)
IV NS 1000ML BAXH (IV SOLUTION) IMPLANT
IV NS 500ML (IV SOLUTION)
IV NS 500ML BAXH (IV SOLUTION) ×1 IMPLANT
KIT FILL ASEPTIC TRANSFER (MISCELLANEOUS) IMPLANT
NDL HYPO 25X1 1.5 SAFETY (NEEDLE) IMPLANT
NEEDLE HYPO 25X1 1.5 SAFETY (NEEDLE) ×1 IMPLANT
PACK BASIN DAY SURGERY FS (CUSTOM PROCEDURE TRAY) ×1 IMPLANT
PAD FOAM SILICONE BACKED (GAUZE/BANDAGES/DRESSINGS) IMPLANT
PENCIL SMOKE EVACUATOR (MISCELLANEOUS) ×1 IMPLANT
PIN SAFETY STERILE (MISCELLANEOUS) ×1 IMPLANT
SLEEVE SCD COMPRESS KNEE MED (STOCKING) ×1 IMPLANT
SPIKE FLUID TRANSFER (MISCELLANEOUS) IMPLANT
SPONGE T-LAP 18X18 ~~LOC~~+RFID (SPONGE) ×2 IMPLANT
STRIP SUTURE WOUND CLOSURE 1/2 (MISCELLANEOUS) IMPLANT
SUT MNCRL AB 4-0 PS2 18 (SUTURE) ×1 IMPLANT
SUT MON AB 3-0 SH 27 (SUTURE) ×1
SUT MON AB 3-0 SH27 (SUTURE) ×1 IMPLANT
SUT MON AB 5-0 PS2 18 (SUTURE) IMPLANT
SUT PDS 3-0 CT2 (SUTURE) ×1
SUT PDS AB 2-0 CT2 27 (SUTURE) IMPLANT
SUT PDS II 3-0 CT2 27 ABS (SUTURE) IMPLANT
SUT SILK 3 0 PS 1 (SUTURE) ×1 IMPLANT
SUT VIC AB 3-0 SH 27 (SUTURE)
SUT VIC AB 3-0 SH 27X BRD (SUTURE) IMPLANT
SYR BULB IRRIG 60ML STRL (SYRINGE) ×1 IMPLANT
SYR CONTROL 10ML LL (SYRINGE) IMPLANT
TOWEL GREEN STERILE FF (TOWEL DISPOSABLE) ×2 IMPLANT
TRAY DSU PREP LF (CUSTOM PROCEDURE TRAY) ×1 IMPLANT
TUBE CONNECTING 20X1/4 (TUBING) ×1 IMPLANT
UNDERPAD 30X36 HEAVY ABSORB (UNDERPADS AND DIAPERS) ×2 IMPLANT
YANKAUER SUCT BULB TIP NO VENT (SUCTIONS) ×1 IMPLANT

## 2023-03-26 NOTE — Anesthesia Postprocedure Evaluation (Signed)
Anesthesia Post Note  Patient: Vanessa Medina  Procedure(s) Performed: Removal of left breast expander and replacement with expander (Left: Chest) BREAST RECONSTRUCTION WITH PLACEMENT OF TISSUE EXPANDER (Left: Chest)     Patient location during evaluation: PACU Anesthesia Type: General Level of consciousness: awake and alert and oriented Pain management: pain level controlled Vital Signs Assessment: post-procedure vital signs reviewed and stable Respiratory status: spontaneous breathing, nonlabored ventilation and respiratory function stable Cardiovascular status: blood pressure returned to baseline and stable Postop Assessment: no apparent nausea or vomiting Anesthetic complications: no   No notable events documented.  Last Vitals:  Vitals:   03/26/23 1000 03/26/23 1015  BP: 132/74   Pulse: 74 (!) 59  Resp: 18 17  Temp: (!) 36.2 C   SpO2: 94% 98%    Last Pain:  Vitals:   03/26/23 1015  TempSrc:   PainSc: Asleep                 [name] A.

## 2023-03-26 NOTE — Discharge Instructions (Addendum)
Next dose of Tylenol due after 1:20 today if needed  INSTRUCTIONS FOR AFTER BREAST SURGERY   You will likely have some questions about what to expect following your operation.  The following information will help you and your family understand what to expect when you are discharged from the hospital.  It is important to follow these guidelines to help ensure a smooth recovery and reduce complication.  Postoperative instructions include information on: diet, wound care, medications and physical activity.  AFTER SURGERY Expect to go home after the procedure.  In some cases, you may need to spend one night in the hospital for observation.  DIET Breast surgery does not require a specific diet.  However, the healthier you eat the better your body will heal. It is important to increasing your protein intake.  This means limiting the foods with sugar and carbohydrates.  Focus on vegetables and some meat.  If you have liposuction during your procedure be sure to drink water.  If your urine is bright yellow, then it is concentrated, and you need to drink more water.  As a general rule after surgery, you should have 8 ounces of water every hour while awake.  If you find you are persistently nauseated or unable to take in liquids let us know.  NO TOBACCO USE or EXPOSURE.  This will slow your healing process and lead to a wound.  WOUND CARE Leave the binder on for 3 days . Use fragrance free soap like Dial, Dove or [place].   After 3 days you can remove the binder to shower. Once dry apply binder or sports bra. If you have liposuction you will have a soft and spongy dressing (Lipofoam) that helps prevent creases in your skin.  Remove before you shower and then replace it.  It is also available on [employer]. If you have steri-strips / tape directly attached to your skin leave them in place. It is OK to get these wet.   No baths, pools or hot tubs for four weeks. We close your incision to leave the smallest and  best-looking scar. No ointment or creams on your incisions for four weeks.  No Neosporin (Too many skin reactions).  A few weeks after surgery you can use Mederma and start massaging the scar. We ask you to wear your binder or sports bra for the first 6 weeks around the clock, including while sleeping. This provides added comfort and helps reduce the fluid accumulation at the surgery site. NO Ice or heating pads to the operative site.  You have a very high risk of a BURN before you feel the temperature change.  ACTIVITY No heavy lifting until cleared by the doctor.  This usually means no more than a half-gallon of milk.  It is OK to walk and climb stairs. Moving your legs is very important to decrease your risk of a blood clot.  It will also help keep you from getting deconditioned.  Every 1 to 2 hours get up and walk for 5 minutes. This will help with a quicker recovery back to normal.  Let pain be your guide so you don't do too much.  This time is for you to recover.  You will be more comfortable if you sleep and rest with your head elevated either with a few pillows under you or in a recliner.  No stomach sleeping for a three months.  WORK Everyone returns to work at different times. As a rough guide, most people take at least  1 - 2 weeks off prior to returning to work. If you need documentation for your job, give the forms to the front staff at the clinic.  DRIVING Arrange for someone to bring you home from the hospital after your surgery.  You may be able to drive a few days after surgery but not while taking any narcotics or valium.  BOWEL MOVEMENTS Constipation can occur after anesthesia and while taking pain medication.  It is important to stay ahead for your comfort.  We recommend taking Milk of Magnesia (2 tablespoons; twice a day) while taking the pain pills.  MEDICATIONS You may be prescribed should start after surgery At your preoperative visit for you history and physical you may have  been given the following medications: An antibiotic: Start this medication when you get home and take according to the instructions on the bottle. Zofran 4 mg:  This is to treat nausea and vomiting.  You can take this every 6 hours as needed and only if needed. Valium 2 mg for breast cancer patients: This is for muscle tightness if you have an implant or expander. This will help relax your muscle which also helps with pain control.  This can be taken every 12 hours as needed. Don't drive after taking this medication. Norco (hydrocodone/acetaminophen) 5/325 mg:  This is only to be used after you have taken the Motrin or the Tylenol. Every 8 hours as needed.   Over the counter Medication to take: Ibuprofen (Motrin) 600 mg:  Take this every 6 hours.  If you have additional pain then take 500 mg of the Tylenol every 8 hours.  Only take the Norco after you have tried these two. MiraLAX or Milk of Magnesia: Take this according to the bottle if you take the Norco.  WHEN TO CALL Call your surgeon's office if any of the following occur: Fever 101 degrees F or greater Excessive bleeding or fluid from the incision site. Pain that increases over time without aid from the medications Redness, warmth, or pus draining from incision sites Persistent nausea or inability to take in liquids Severe misshapen area that underwent the operation.    Post Anesthesia Home Care Instructions  Activity: Get plenty of rest for the remainder of the day. A responsible individual must stay with you for 24 hours following the procedure.  For the next 24 hours, DO NOT: -Drive a car -Advertising copywriter -Drink alcoholic beverages -Take any medication unless instructed by your physician -Make any legal decisions or sign important papers.  Meals: Start with liquid foods such as gelatin or soup. Progress to regular foods as tolerated. Avoid greasy, spicy, heavy foods. If nausea and/or vomiting occur, drink only clear  liquids until the nausea and/or vomiting subsides. Call your physician if vomiting continues.  Special Instructions/Symptoms: Your throat may feel dry or sore from the anesthesia or the breathing tube placed in your throat during surgery. If this causes discomfort, gargle with warm salt water. The discomfort should disappear within 24 hours.  If you had a scopolamine patch placed behind your ear for the management of post- operative nausea and/or vomiting:  1. The medication in the patch is effective for 72 hours, after which it should be removed.  Wrap patch in a tissue and discard in the trash. Wash hands thoroughly with soap and water. 2. You may remove the patch earlier than 72 hours if you experience unpleasant side effects which may include dry mouth, dizziness or visual disturbances. 3. Avoid touching the  patch. Wash your hands with soap and water after contact with the patch.

## 2023-03-26 NOTE — Anesthesia Procedure Notes (Signed)
Procedure Name: Intubation Date/Time: 03/26/2023 9:21 AM  Performed by: Alvera Novel, CRNAPre-anesthesia Checklist: Patient identified, Emergency Drugs available, Suction available and Patient being monitored Patient Re-evaluated:Patient Re-evaluated prior to induction Oxygen Delivery Method: Circle System Utilized Preoxygenation: Pre-oxygenation with 100% oxygen Induction Type: IV induction Ventilation: Mask ventilation without difficulty Laryngoscope Size: Mac and 3 Grade View: Grade I Tube type: Oral Tube size: 7.0 mm Number of attempts: 1 Airway Equipment and Method: Stylet and Oral airway Placement Confirmation: ETT inserted through vocal cords under direct vision, positive ETCO2 and breath sounds checked- equal and bilateral Secured at: 21 cm Tube secured with: Tape Dental Injury: Teeth and Oropharynx as per pre-operative assessment

## 2023-03-26 NOTE — Interval H&P Note (Signed)
History and Physical Interval Note:  03/26/2023 8:46 AM  Vanessa Medina  has presented today for surgery, with the diagnosis of Rupture of implant of left breast.  The various methods of treatment have been discussed with the patient and family. After consideration of risks, benefits and other options for treatment, the patient has consented to  Procedure(s): Removal of left breast expander and replacement with expander, possible placement of Flex HD (Left) BREAST RECONSTRUCTION WITH PLACEMENT OF TISSUE EXPANDER AND FLEX HD (ACELLULAR HYDRATED DERMIS) (Left) as a surgical intervention.  The patient's history has been reviewed, patient examined, no change in status, stable for surgery.  I have reviewed the patient's chart and labs.  Questions were answered to the patient's satisfaction.     Alena Bills Santiaga Butzin

## 2023-03-26 NOTE — Op Note (Signed)
Op report    DATE OF OPERATION:  03/26/2023  LOCATION: [facility]  SURGICAL DIVISION: Plastic Surgery  PREOPERATIVE DIAGNOSES:  1. History of left breast cancer.  2. Acquired absence of left breast.  3. Ruptured left breast expander.  POSTOPERATIVE DIAGNOSES:  Same as preoperative diagnosis  PROCEDURE:  1. Removal of ruptured left expander and placement of new expander.  SURGEON: Foster Simpson, DO  ASSISTANT: Evelena Leyden, PA  ANESTHESIA:  General.   COMPLICATIONS: None.   IMPLANTS: Mentor expander 535 cc with 350 cc of saline placed  INDICATIONS FOR PROCEDURE:  The patient, Vanessa Medina, is a 52 y.o. female born on 1971-07-15, is here for further treatment after a mastectomy and placement of a tissue expander. She has a leak of the left breast expander.  We will remove and replace it with a new one.  MRN: [id number]  CONSENT:  Informed consent was obtained directly from the patient. Risks, benefits and alternatives were fully discussed. Specific risks including but not limited to bleeding, infection, hematoma, seroma, scarring, pain, implant infection, implant extrusion, capsular contracture, asymmetry, wound healing problems, and need for further surgery were all discussed. The patient did have an ample opportunity to have her questions answered to her satisfaction.   DESCRIPTION OF PROCEDURE:  The patient was taken to the operating room. SCDs were placed and IV antibiotics were given. The patient's chest was prepped and draped in a sterile fashion. A time out was performed and the implants to be used were identified.  Local with epinephrine was used to infiltrate the area.   The old mastectomy scar was opened with a #15 blade.  The ADM was intact as well as the muscle.  No sign of infection.  The ADM / muscle was incised and the expander was removed.  There was a leak on the back side of the expander.  The pocket was irrigated with antibiotic  solution.  The new expander was prepared and placed in the pocket  it was secured to the chest wall with two tabs using 2-0 PDS. The expander was filled with saline. The incision was closed with the 3-0 PDS.  The deep layer was then closed with a 3-0 running Monocryl suture. The remaining skin was closed with 4-0 Monocryl.  Dermabond was applied.  A breast binder and ABD was applied.  The patient was allowed to wake from anesthesia and taken to the recovery room in satisfactory condition.   The advanced practice practitioner (APP) assisted throughout the case.  The APP was essential in retraction and counter traction when needed to make the case progress smoothly.  This retraction and assistance made it possible to see the tissue plans for the procedure.  The assistance was needed for blood control, tissue re-approximation and assisted with closure of the incision site.

## 2023-03-26 NOTE — Transfer of Care (Addendum)
Immediate Anesthesia Transfer of Care Note  Patient: Vanessa Medina  Procedure(s) Performed: Removal of left breast expander and replacement with expander (Left: Chest) BREAST RECONSTRUCTION WITH PLACEMENT OF TISSUE EXPANDER (Left: Chest)  Patient Location: PACU  Anesthesia Type:General  Level of Consciousness: drowsy and patient cooperative  Airway & Oxygen Therapy: Patient Spontanous Breathing and Patient connected to face mask oxygen  Post-op Assessment: Report given to RN and Post -op Vital signs reviewed and stable  Post vital signs: Reviewed and stable  Last Vitals:  Vitals Value Taken Time  BP 116/81 03/26/23 0954  Temp    Pulse 73 03/26/23 1000  Resp 17 03/26/23 1000  SpO2 95 % 03/26/23 1000  Vitals shown include unfiled device data.  Last Pain:  Vitals:   03/26/23 0654  TempSrc: Oral  PainSc: 4       Patients Stated Pain Goal: 7 (03/26/23 0654)  Complications: No notable events documented.

## 2023-03-27 ENCOUNTER — Encounter (HOSPITAL_BASED_OUTPATIENT_CLINIC_OR_DEPARTMENT_OTHER): Payer: Self-pay | Admitting: Plastic Surgery

## 2023-04-02 ENCOUNTER — Ambulatory Visit (INDEPENDENT_AMBULATORY_CARE_PROVIDER_SITE_OTHER): Payer: Medicaid Other | Admitting: Surgical

## 2023-04-02 VITALS — BP 131/82 | HR 77

## 2023-04-02 DIAGNOSIS — Z9884 Bariatric surgery status: Secondary | ICD-10-CM

## 2023-04-02 DIAGNOSIS — Z9889 Other specified postprocedural states: Secondary | ICD-10-CM

## 2023-04-02 DIAGNOSIS — D510 Vitamin B12 deficiency anemia due to intrinsic factor deficiency: Secondary | ICD-10-CM

## 2023-04-02 MED ORDER — TRIAMCINOLONE ACETONIDE 0.025 % EX OINT: 1.0000 | TOPICAL_OINTMENT | Freq: Two times a day (BID) | CUTANEOUS | 0 refills | Status: DC

## 2023-04-02 NOTE — Progress Notes (Signed)
Patient is a very pleasant 52 year old female here for follow-up after surgery on 03/26/2023.  She underwent removal of her left breast tissue expander and replacement with a new tissue expander due to rupture.  She initially underwent bilateral mastectomies and immediate bilateral breast reconstruction and placement of tissue expanders and Flex HD with Dr. Ulice Bold and Dr. Everlene Farrier on 01/18/2023.  Intraoperatively she had 350 cc of saline placed in the left breast tissue expander. She currently has 370 cc in her right breast tissue expander.  Patient reports today that she "feels wiped out" after surgery.  She reports she has been drinking plenty of fluids, she is not having any infectious symptoms.  She denies any pulmonary or cardiac symptoms.  She does report that she has a history of anemia, has been unable to see her PCP yet.  She does report that she has been having a lot of itching to the left breast, had to remove the Steri-Strips and Dermabond that was placed in surgery due to severe itching.  She reports that she is still having significant itching but she is trying to avoid touching the area.  She reports that she is surprised that she does not have a drain after surgery, she does feel as if she has reaccumulated fluid in the left breast.  Chaperone present on exam BP 131/82 (BP Location: Left Arm, Patient Position: Sitting, Cuff Size: Large)   Pulse 77  Patient is sitting up in exam chair, no acute distress, breathing is unlabored, she does appear fatigued and tired.  She is ambulating normally. On exam left breast with mild irritation noted in the peri-incisional area.  There is subcutaneous fluid collection noted with palpation extending along the inferior mastectomy flap.  She does have some surrounding irritation, but no erythema or cellulitic changes noted.  She has some mild tenderness noted.  A/P:  Discussed with patient following up with her PCP for evaluation to discuss her  fatigue, suspect she may have some postoperative anemia due to recent surgeries and her history of gastric bypass and pernicious anemia as well.  Will order CBC for patient.  Discussed with patient options for aspirating the left breast versus continuing to watch and increasing compression.  She would like to proceed with aspiration.  The left breast was aspirated over the expander port using a sterile technique, about 15 cc of serous sanguinous fluid was collected from the left breast pocket.   Encouraged patient to restart taking multivitamins, healthy diet and plenty of fluids.  Exam today is reassuring, vital signs are stable.  We will plan to see her back in 1 week for reevaluation.  Patient is agreeable to follow-up with PCP.

## 2023-04-03 ENCOUNTER — Other Ambulatory Visit: Payer: Self-pay | Admitting: Internal Medicine

## 2023-04-03 ENCOUNTER — Encounter: Payer: Self-pay | Admitting: *Deleted

## 2023-04-03 MED ORDER — TOLTERODINE TARTRATE ER 4 MG PO CP24: 4.0000 mg | ORAL_CAPSULE | Freq: Every day | ORAL | 0 refills | Status: AC

## 2023-04-03 NOTE — Progress Notes (Signed)
Dr. Alena Bills asked me to call Ms. Harnish and let her know that there is an interaction between myrbetriq and tamoxifen.   She is to stop the myrbetriq and detrol LA has been sent to the pharmacy.   Voice mail left with the above information.

## 2023-04-03 NOTE — Progress Notes (Signed)
Patient was started on tamoxifen recently.  It is showing the interaction with Myrbetriq.  Decreases the level of tamoxifen.  It does not show interaction with the alternative drug tolterodine.  I discussed the case with Dr. Berline Chough from [facility] who has been managing her overactive bladder.  She is okay with switching to tolterodine.  Sent prescription for 4 mg once daily.  Will inform the patient.

## 2023-04-05 ENCOUNTER — Telehealth: Payer: Self-pay

## 2023-04-05 NOTE — Telephone Encounter (Signed)
Cover my meds PA sent for Tolterodine request by [facility] Key: [id number]

## 2023-04-06 ENCOUNTER — Encounter: Payer: Self-pay | Admitting: *Deleted

## 2023-04-06 DIAGNOSIS — Z170 Estrogen receptor positive status [ER+]: Secondary | ICD-10-CM

## 2023-04-06 NOTE — Progress Notes (Signed)
Ms. Oconnell called to check on prior auth for detrol LA.   Message sent to Dr. Roberts Gaudy team.  She has also had a poor appetite and fatigue since starting AI.  Referral entered for nutritionist.

## 2023-04-09 ENCOUNTER — Ambulatory Visit (INDEPENDENT_AMBULATORY_CARE_PROVIDER_SITE_OTHER): Payer: Medicaid Other | Admitting: Surgical

## 2023-04-09 VITALS — BP 116/78 | HR 85

## 2023-04-09 DIAGNOSIS — Z9889 Other specified postprocedural states: Secondary | ICD-10-CM

## 2023-04-09 DIAGNOSIS — Z9013 Acquired absence of bilateral breasts and nipples: Secondary | ICD-10-CM

## 2023-04-09 DIAGNOSIS — C50812 Malignant neoplasm of overlapping sites of left female breast: Secondary | ICD-10-CM

## 2023-04-09 NOTE — Progress Notes (Signed)
Patient is a 52 year old female here for follow-up on her bilateral breast reconstruction.  She most recently underwent surgery on 03/26/2023 for removal and replacement of left breast tissue expander due to rupture.    Her initial bilateral mastectomy and placement of tissue expanders was on 01/18/2023 with Dr. Ulice Bold and Dr. Everlene Farrier.  She currently has 370 cc in her right breast tissue expander.  She has 350 cc in the left breast tissue expander.  At her last appointment, patient noted fatigue.  Discussed following up with her PCP to discuss further, but did order CBC at that time.  CBC results were normal.  Patient reports that she spoke with PCP, also spoke with oncology.  They are discussing medication management and possibility of her symptoms being related to tamoxifen.  Chaperone present on exam On exam bilateral breast incisions are intact and healing well.  There is no subcutaneous fluid collection noted to the right breast, potentially some small amount of fluid collection noted in the left breast.  No overlying skin changes.  No tenderness noted with palpation on exam.  Bilateral breasts are overall fairly symmetric.  There is no erythema or cellulitic changes noted of either breast.   A/P:  We placed injectable saline in the Expander using a sterile technique: Right: 50 cc for a total of 420 / 535 cc Left: 70 cc for a total of 420 / 535 cc  Recommend following up in 1 week for reevaluation, may be able to fill again at that point.  We discussed surgical planning.  Recommend discussing this further with Dr. Ulice Bold due to recent return to the OR on 03/26/2023.

## 2023-04-12 ENCOUNTER — Encounter: Payer: Self-pay | Admitting: *Deleted

## 2023-04-12 NOTE — Telephone Encounter (Signed)
(  Key: [id number]) PA Case ID #: [id number] Rx #: [id number]  Denied on August 1 by [facility] Medicaid 2017 Denied. The requested drug is not approved by the Food and Drug Administration (FDA) for the treatment of UNSPECIFIED MALIGNANT NEOPLASM OF SKIN OF BREAST in members 53 years of age or older. It is FDA approved for Overactive urinary bladder, With symptoms of urinary frequency, urgency, or urge incontinence in members 74 years of age or older.

## 2023-04-13 ENCOUNTER — Telehealth: Payer: Self-pay

## 2023-04-13 ENCOUNTER — Other Ambulatory Visit: Payer: Self-pay | Admitting: Physical Medicine and Rehabilitation

## 2023-04-13 NOTE — Telephone Encounter (Signed)
Called patient to inform her about her insura

## 2023-04-13 NOTE — Telephone Encounter (Signed)
Spoke with patient in regards to her insurance wanting her to have a prior authorization for the Tolterodine Dr. Alena Bills wanted the patient to start taking. The insurance denied the prior authorization, then we sent in an appeal. Now patient is coming in sometime today before we close to sign off on the appeal.

## 2023-04-13 NOTE — Telephone Encounter (Signed)
Error

## 2023-04-13 NOTE — Telephone Encounter (Signed)
Faxed over the appeal form completed with Mrs. Livingstone signature.

## 2023-04-19 ENCOUNTER — Other Ambulatory Visit: Payer: Self-pay | Admitting: *Deleted

## 2023-04-19 NOTE — Telephone Encounter (Signed)
Paper fax request from [facility]. Patient also requesting Duloxetine 60 mg be sent to [facility]

## 2023-04-20 NOTE — Telephone Encounter (Signed)
LVM

## 2023-04-20 NOTE — Telephone Encounter (Signed)
Refusing- 1. Don't want to prescribe Singulair again; just refilled meds for 6months- don't wants to give longer than 6 months of meds- thanks- ML

## 2023-04-24 ENCOUNTER — Ambulatory Visit (INDEPENDENT_AMBULATORY_CARE_PROVIDER_SITE_OTHER): Payer: Medicaid Other | Admitting: Plastic Surgery

## 2023-04-24 ENCOUNTER — Encounter: Payer: Self-pay | Admitting: Plastic Surgery

## 2023-04-24 DIAGNOSIS — Z9889 Other specified postprocedural states: Secondary | ICD-10-CM

## 2023-04-24 DIAGNOSIS — Z170 Estrogen receptor positive status [ER+]: Secondary | ICD-10-CM

## 2023-04-24 NOTE — Progress Notes (Signed)
   Subjective:    Patient ID: Vanessa Medina, female    DOB: [birth year], 52 y.o.   MRN: [id number]  The patient is a 52 year old female here for follow-up after having a replacement of the left breast expander due to a leak.  We were able to get 350 cc in the expander at the time of the surgery.  She now has 420 cc in the expander on both sides so they are now equal.      Review of Systems  Constitutional: Negative.   Eyes: Negative.   Respiratory: Negative.    Cardiovascular: Negative.   Gastrointestinal: Negative.   Genitourinary: Negative.   Musculoskeletal: Negative.        Objective:   Physical Exam Constitutional:      Appearance: Normal appearance.  Cardiovascular:     Rate and Rhythm: Normal rate.     Pulses: Normal pulses.  Pulmonary:     Effort: Pulmonary effort is normal.  Neurological:     Mental Status: She is alert and oriented to person, place, and time.  Psychiatric:        Mood and Affect: Mood normal.        Behavior: Behavior normal.        Thought Content: Thought content normal.        Judgment: Judgment normal.         Assessment & Plan:     ICD-10-CM   1. Malignant neoplasm of overlapping sites of left breast in female, estrogen receptor positive (HCC)  C50.812    Z17.0        We placed injectable saline in the Expander using a sterile technique: Right: 50 cc for a total of 470 / 535 cc Left: 50 cc for a total of 470 / 535 cc  She will likely need a couple more fills but we are getting close.

## 2023-04-25 ENCOUNTER — Other Ambulatory Visit (INDEPENDENT_AMBULATORY_CARE_PROVIDER_SITE_OTHER): Payer: Medicaid Other

## 2023-04-25 ENCOUNTER — Encounter: Payer: Self-pay | Admitting: Orthopaedic Surgery

## 2023-04-25 ENCOUNTER — Ambulatory Visit: Payer: Medicaid Other | Admitting: Orthopaedic Surgery

## 2023-04-25 VITALS — Ht 65.0 in | Wt 255.0 lb

## 2023-04-25 DIAGNOSIS — M25562 Pain in left knee: Secondary | ICD-10-CM | POA: Diagnosis not present

## 2023-04-25 DIAGNOSIS — G8929 Other chronic pain: Secondary | ICD-10-CM

## 2023-04-25 DIAGNOSIS — M25561 Pain in right knee: Secondary | ICD-10-CM

## 2023-04-25 MED ORDER — LIDOCAINE HCL 1 % IJ SOLN
2.0000 mL | INTRAMUSCULAR | Status: AC | PRN
Start: 2023-04-25 — End: 2023-04-25
  Administered 2023-04-25: 2 mL

## 2023-04-25 MED ORDER — METHYLPREDNISOLONE ACETATE 40 MG/ML IJ SUSP
40.0000 mg | INTRAMUSCULAR | Status: AC | PRN
Start: 2023-04-25 — End: 2023-04-25
  Administered 2023-04-25: 40 mg via INTRA_ARTICULAR

## 2023-04-25 MED ORDER — BUPIVACAINE HCL 0.5 % IJ SOLN
2.0000 mL | INTRAMUSCULAR | Status: AC | PRN
Start: 2023-04-25 — End: 2023-04-25
  Administered 2023-04-25: 2 mL via INTRA_ARTICULAR

## 2023-04-25 NOTE — Progress Notes (Signed)
Office Visit Note   Patient: KALYNA MICHELS           Date of Birth: Apr 06, 1971           MRN: [id number] Visit Date: 04/25/2023              Requested by: [name], MD [address] [place],  [place] [address] PCP: [name], MD   Assessment & Plan: Visit Diagnoses:  1. Chronic pain of both knees     Plan: [name] is a 52 year old female with bilateral knee osteoarthritis worse on the right.  At this time I feel that the best option is to repeat cortisone injections.  She is currently dealing with breast cancer treatment.  She tolerated the injections well.  Will see her back as needed.  Follow-Up Instructions: No follow-ups on file.   Orders:  Orders Placed This Encounter  Procedures   XR KNEE 3 VIEW LEFT   XR KNEE 3 VIEW RIGHT   No orders of the defined types were placed in this encounter.     Procedures: Large Joint Inj: bilateral knee on 04/25/2023 9:13 AM Indications: pain Details: 22 G needle  Arthrogram: No  Medications (Right): 2 mL lidocaine 1 %; 2 mL bupivacaine 0.5 %; 40 mg methylPREDNISolone acetate 40 MG/ML Medications (Left): 2 mL lidocaine 1 %; 2 mL bupivacaine 0.5 %; 40 mg methylPREDNISolone acetate 40 MG/ML Outcome: tolerated well, no immediate complications Patient was prepped and draped in the usual sterile fashion.       Clinical Data: No additional findings.   Subjective: Chief Complaint  Patient presents with   Left Knee - Pain   Right Knee - Pain    HPI [name] is a 52 year old female who is well-known to me comes in for evaluation of left greater than right bilateral knee pain.  It throbs at night and overall symptoms are getting worse.  She is currently under treatment for breast cancer.  She is trying to walk every day to remain active. Review of Systems  Constitutional: Negative.   HENT: Negative.    Eyes: Negative.   Respiratory: Negative.    Cardiovascular: Negative.   Endocrine: Negative.    Musculoskeletal: Negative.   Neurological: Negative.   Hematological: Negative.   Psychiatric/Behavioral: Negative.    All other systems reviewed and are negative.    Objective: Vital Signs: Ht 5\' 5"  (1.651 m)   Wt 255 lb (115.7 kg)   BMI 42.43 kg/m   Physical Exam Vitals and nursing note reviewed.  Constitutional:      Appearance: She is well-developed.  HENT:     Head: Atraumatic.     Nose: Nose normal.  Eyes:     Extraocular Movements: Extraocular movements intact.  Cardiovascular:     Pulses: Normal pulses.  Pulmonary:     Effort: Pulmonary effort is normal.  Abdominal:     Palpations: Abdomen is soft.  Musculoskeletal:     Cervical back: Neck supple.  Skin:    General: Skin is warm.     Capillary Refill: Capillary refill takes less than 2 seconds.  Neurological:     Mental Status: She is alert. Mental status is at baseline.  Psychiatric:        Behavior: Behavior normal.        Thought Content: Thought content normal.        Judgment: Judgment normal.     Ortho Exam Emanation of the right knee shows a fully  healed postsurgical scar from previous ACL surgery.  No joint effusion.  Pain and crepitus with range of motion.  Examination of the left knee shows no joint effusion.  Minimal crepitus with range of motion. Specialty Comments:  No specialty comments available.  Imaging: No results found.   PMFS History: Patient Active Problem List   Diagnosis Date Noted   Claustrophobia    Pernicious anemia 03/14/2023   Ruptured left breast implant 02/13/2023   S/P mastectomy, bilateral 01/18/2023   Genetic testing 12/18/2022   Breast cancer (HCC) 12/08/2022   Family history of breast cancer 12/08/2022   Profound fatigue 12/08/2022   Lumbar post-laminectomy syndrome 06/02/2022   Pseudarthrosis after fusion or arthrodesis 05/19/2022   Primary osteoarthritis of right knee 03/28/2022   Subjective tinnitus of both ears 12/21/2021   Abnormality of gait  04/29/2021   Abnormal sensation in both ears 04/06/2021   Other adverse food reactions, not elsewhere classified, subsequent encounter 04/06/2021   Multiple drug allergies 04/06/2021   Insomnia due to medical condition 02/25/2021   Myofascial muscle pain 02/25/2021   Nerve pain 10/18/2020   Incomplete paraplegia (HCC) 10/18/2020   Orthostatic hypotension 10/18/2020   Renal cyst 09/23/2020   Chronic migraine without aura without status migrainosus, not intractable    Hypokalemia    Adjustment reaction with anxiety and depression    Spinal cord injury, lumbar, without spinal bone injury, sequela (HCC) 08/18/2020   Paraparesis (HCC)    Post-operative pain    Hypotension    Slow transit constipation    AKI (acute kidney injury) (HCC)    Right leg weakness 08/11/2020   DDD (degenerative disc disease), lumbar 09/02/2019    Class: Chronic   Degenerative disc disease, lumbar 09/02/2019   Chest tightness 09/23/2018   Bradycardia 09/23/2018   BMI 40.0-44.9, adult (HCC) 04/17/2018   Status post bariatric surgery 04/17/2018   Labile blood pressure 03/08/2018   Chronic low back pain 09/03/2017   History of total hip replacement, left 01/26/2016   Osteoarthritis of spine with radiculopathy, lumbar region 10/16/2014   EDEMA 04/28/2008   LEG PAIN, BILATERAL 12/16/2007   CERVICAL CANCER 07/02/2007   MORBID OBESITY 07/02/2007   DEPRESSION 07/02/2007   COMMON MIGRAINE 07/02/2007   Other allergic rhinitis 07/02/2007   ASTHMA 07/02/2007   GERD 07/02/2007   ELEVATED BLOOD PRESSURE WITHOUT DIAGNOSIS OF HYPERTENSION 07/02/2007   Asthma 07/02/2007   Past Medical History:  Diagnosis Date   Anemia    Anxiety    Cervical cancer (HCC)    Chronic kidney disease    Previous now clear   Claustrophobia    Depression    Family history of adverse reaction to anesthesia     " my mother takes a long time time wake up"   GERD (gastroesophageal reflux disease)    History of dysplastic nevus  01/18/2018   right shoulder, recurrent dysplastic nevus   History of kidney stones    History of shingles    Hypertension    Migraine    Multiple allergies    Obesity    Osteoarthritis    left hip   Pneumonia    PONV (postoperative nausea and vomiting)    slow to wake up   Sleep apnea    does not wear CPAP   Slow to wake up after anesthesia    Stroke (HCC) 08/11/2020   spinal cord stroke   Wears glasses     Family History  Problem Relation Age of Onset   Renal  Disease Mother    Hypertension Mother    Sudden Cardiac Death Mother    Heart failure Mother    Valvular heart disease Mother    Heart disease Mother    Breast cancer Maternal Grandmother    Stroke Brother    Heart attack Brother 52   Diabetes Other     Past Surgical History:  Procedure Laterality Date   ABDOMINAL HYSTERECTOMY     APPENDECTOMY     BACK SURGERY     BILIOPANCREATIC DIVISION W DUODENAL SWITCH N/A    BREAST BIOPSY Left 12/06/2022   u/s bx,1:00 heart clip, path pending   BREAST BIOPSY Left 12/06/2022   [name] bx 2:00 ribbon clip path pending   BREAST BIOPSY Left 12/06/2022   [name] LT BREAST BX W LOC DEV EA ADD LESION IMG BX SPEC US GUIDE 12/06/2022 [facility]   BREAST BIOPSY Left 12/06/2022   [name] LT BREAST BX W LOC DEV 1ST LESION IMG BX SPEC US GUIDE 12/06/2022 [facility]   BREAST RECONSTRUCTION WITH PLACEMENT OF TISSUE EXPANDER AND FLEX HD (ACELLULAR HYDRATED DERMIS) Bilateral 01/18/2023   Procedure: IMMEDIATE LEFT BREAST RECONSTRUCTION WITH PLACEMENT OF TISSUE EXPANDER AND FLEX HD (ACELLULAR HYDRATED DERMIS);  Surgeon: Peggye Form, DO;  Location: [facility];  Service: Plastics;  Laterality: Bilateral;   BREAST RECONSTRUCTION WITH PLACEMENT OF TISSUE EXPANDER AND FLEX HD (ACELLULAR HYDRATED DERMIS) Left 03/26/2023   Procedure: BREAST RECONSTRUCTION WITH PLACEMENT OF TISSUE EXPANDER;  Surgeon: Peggye Form, DO;  Location: [facility];  Service: Plastics;  Laterality: Left;    CHOLECYSTECTOMY     COLONOSCOPY N/A 06/27/2021   Procedure: COLONOSCOPY;  Surgeon: Jaynie Collins, DO;  Location: [facility];  Service: Gastroenterology;  Laterality: N/A;   DIAGNOSTIC LAPAROSCOPY     LOA   ESOPHAGOGASTRODUODENOSCOPY N/A 06/27/2021   Procedure: ESOPHAGOGASTRODUODENOSCOPY (EGD);  Surgeon: Jaynie Collins, DO;  Location: [facility] ENDOSCOPY;  Service: Gastroenterology;  Laterality: N/A;   ESOPHAGOGASTRODUODENOSCOPY (EGD) WITH PROPOFOL N/A 07/25/2017   Procedure: ESOPHAGOGASTRODUODENOSCOPY (EGD) WITH PROPOFOL;  Surgeon: Scot Jun, MD;  Location: [facility] ENDOSCOPY;  Service: Endoscopy;  Laterality: N/A;   KNEE ARTHROSCOPY     LAPAROSCOPIC GASTRIC SLEEVE RESECTION WITH HIATAL HERNIA REPAIR     LUMBAR FUSION  08/29/2022   Revision Lumbar two through five Laminectomy & Fusion with Transforaminal Lumbar interbody fusion   MASTECTOMY W/ SENTINEL NODE BIOPSY Bilateral 01/18/2023   Procedure: MASTECTOMY WITH SENTINEL LYMPH NODE BIOPSY, RNFA to assist;  Surgeon: Leafy Ro, MD;  Location: [facility];  Service: General;  Laterality: Bilateral;   TONSILLECTOMY     TOTAL HIP ARTHROPLASTY Left 01/26/2016   Procedure: LEFT TOTAL HIP ARTHROPLASTY ANTERIOR APPROACH;  Surgeon: Tarry Kos, MD;  Location: [facility];  Service: Orthopedics;  Laterality: Left;   TRANSFORAMINAL LUMBAR INTERBODY FUSION (TLIF) WITH PEDICLE SCREW FIXATION 3 LEVEL  08/2019   Vira Browns, MD L2-L5   Social History   Occupational History   Not on file  Tobacco Use   Smoking status: Never    Passive exposure: Never   Smokeless tobacco: Never  Vaping Use   Vaping status: Never Used  Substance and Sexual Activity   Alcohol use: Yes    Comment: occasional wine   Drug use: No   Sexual activity: Not Currently

## 2023-04-27 ENCOUNTER — Inpatient Hospital Stay: Payer: Medicaid Other | Attending: Internal Medicine

## 2023-04-27 NOTE — Progress Notes (Signed)
Nutrition Assessment:  52 year old female with left ER/PR positive, HER2 negative breast cancer.  Patient s/p gastric bypass in 2019, HTN, GERD, right sided hemiparesis complication from spinal stimulator placement.  Patient s/p bilateral mastectomies with bilateral reconstruction, multiple complications.  Not planning radiation.  Started tamoxifen.    Met with patient in clinic. Reports issues with taste and food not tasting well.  Also reports smell of food cooking (eggs and bacon) cause her to be nauseated.  Not currently taking nausea medication but has some.  Yesterday able to eat 1/2 [place] and cheese sandwich, sugar free popscile, and water.  Reports that milk and ensure/boost shakes cause stomach upset.  Has drank boost breeze in the past.  Says that she walks 3 miles every other day.  Appetite has been decreased since surgery (May)  No signs of thrush.  Medications: Vit B 12, Vit B 6, MVI,   Labs: Ca 8.6, Vit B 12 431, folate 18.7, Fe 106, hgb 10.3  Anthropometrics:   Height: 65 inches Weight: 253 lb 2 oz today UBW: 276 lb per patient when started Per chart 12/11/22, 253 lb BMI: 42   NUTRITION DIAGNOSIS: Inadequate oral intake related to taste alterations, poor appetite as evidenced by decreased oral intake    INTERVENTION:  Encouraged patient to start boost breeze shakes, ensure clear shakes(available on [place]).   Discussed strategies to try to help with taste change.  Handout provided Encouraged patient take nausea medication to help with queasy feeling when smells make her sick.  Discussed strategies to help with controlling smells. Encouraged taking bariatric specific MVI. Handout on options provided Encouraged foods rich in protein especially for wound healing.  Contact information provided     MONITORING, EVALUATION, GOAL: weight trends, intake   NEXT VISIT: Friday, Sept 20 in clinic  Vanessa Medina B. Freida Busman, RD, LDN Registered Dietitian [phone]

## 2023-05-03 ENCOUNTER — Telehealth: Payer: Self-pay | Admitting: *Deleted

## 2023-05-03 ENCOUNTER — Encounter: Payer: Self-pay | Admitting: Orthopaedic Surgery

## 2023-05-03 NOTE — Telephone Encounter (Signed)
So is she just informing me??? ML

## 2023-05-03 NOTE — Telephone Encounter (Signed)
FYI:  Previous request to fill Baclofen and Topiramate were denied because they still had refills.Patient's insurance changed and she can no longer use [facility]. Going forward she will need scripts sent to [facility]. I have added pharmacy to her profile. She has a f/u 9/17 and has enough meds till then.

## 2023-05-03 NOTE — Telephone Encounter (Signed)
yes

## 2023-05-04 ENCOUNTER — Other Ambulatory Visit: Payer: Self-pay | Admitting: Orthopaedic Surgery

## 2023-05-04 ENCOUNTER — Other Ambulatory Visit: Payer: Self-pay

## 2023-05-04 MED ORDER — DICLOFENAC SODIUM 1 % EX GEL: 2.0000 g | Freq: Four times a day (QID) | CUTANEOUS | 5 refills | Status: DC

## 2023-05-08 ENCOUNTER — Encounter: Payer: Self-pay | Admitting: Physical Medicine and Rehabilitation

## 2023-05-09 ENCOUNTER — Ambulatory Visit (INDEPENDENT_AMBULATORY_CARE_PROVIDER_SITE_OTHER): Payer: Medicaid Other | Admitting: Surgical

## 2023-05-09 ENCOUNTER — Encounter: Payer: Self-pay | Admitting: Surgical

## 2023-05-09 VITALS — BP 140/76 | HR 90

## 2023-05-09 DIAGNOSIS — Z9013 Acquired absence of bilateral breasts and nipples: Secondary | ICD-10-CM

## 2023-05-09 DIAGNOSIS — Z9889 Other specified postprocedural states: Secondary | ICD-10-CM

## 2023-05-09 NOTE — Progress Notes (Signed)
Patient is a very pleasant 52 year old female here for follow-up on her bilateral breast reconstruction.  She did have to undergo expander exchange on the left due to a leak.  She has been doing well since then and has 470 cc of fluid in each expander.  Today she reports she is doing well, she is ready for another expander fill.  She does report that she has noticed the left breast tissue expander has been moving around in the left breast.  Chaperone present on exam On exam right breast incisions are intact and healing well.  Left breast tissue expander appears to be flipped.  There is no erythema or cellulitic changes noted of either breast.  No subcutaneous fluid collection noted.  She does have some left breast tenderness with palpation.   A/P:  I discussed with patient that the left breast tissue expander appears to be flipped after further examination with use of magnet.  The magnet was repelling the expander port metal.  Dr. Ulice Bold was consulted during today's encounter, patient was evaluated by Dr. Ulice Bold as well and we had a discussion about returning to surgery for either replacement of the tissue expander with a new expander, removing tissue expander and placing an implant.  Patient reports that she would like to move forward with removal of tissue expander and placement of silicone breast implant.  She is aware that we will only be able to place an implant somewhere between 450-535 cc.  We discussed the benefits of silicone versus saline.  She reports she would like silicone.  Discussed with patient that if the expander flips at home, she can certainly follow-up in the office and we can expand the expander prior to surgery.  Patient was agreeable to this plan.  There is no signs infection or concern on exam.  We placed injectable saline in the Expander using a sterile technique: Right: 50 cc for a total of 520 / 535 cc Left: 0 cc for a total of 470 / 535 cc

## 2023-05-14 ENCOUNTER — Telehealth: Payer: Self-pay | Admitting: Surgical

## 2023-05-14 ENCOUNTER — Encounter: Payer: Self-pay | Admitting: Surgical

## 2023-05-14 ENCOUNTER — Ambulatory Visit (INDEPENDENT_AMBULATORY_CARE_PROVIDER_SITE_OTHER): Payer: Medicaid Other | Admitting: Surgical

## 2023-05-14 VITALS — BP 133/84 | HR 89 | Ht 64.0 in | Wt 247.0 lb

## 2023-05-14 DIAGNOSIS — Z9889 Other specified postprocedural states: Secondary | ICD-10-CM

## 2023-05-14 DIAGNOSIS — Z9013 Acquired absence of bilateral breasts and nipples: Secondary | ICD-10-CM

## 2023-05-14 NOTE — Telephone Encounter (Signed)
Spoke with her, she is going to come at 1pm. Please add to my schedule  Matt

## 2023-05-14 NOTE — Progress Notes (Signed)
Patient is a very pleasant 53 year old female here for follow-up on her bilateral breast reconstruction.  At her last appointment it was noted that her left breast tissue expander was flipped and we were unable to expand it.  She reports to me today that it flipped back over when she was bending over doing some laundry.  She would like to have some additional fluid placed in the expanders prior to surgery.  She also reports that she noticed a small lump on the medial left breast recently and she would like me to take a look at this.  Chaperone present on exam On exam bilateral breast incisions are intact and well-healed.  Left breast tissue expander is noted to be flipped back in the correct orientation.  She does have a 2 x 2 mm lesion of the left medial breast, just underneath the skin.  It is firm, nonmobile.  There is no drainage.  She does have what appears to be a slight punctate opening/skin pore at the center of the lesion.  With palpation I do not appreciate any drainage.  The lesion is 2 cm from the left breast medial SK/dark skin lesion.   We placed injectable saline in the Expander using a sterile technique: Right: 50 cc for a total of 570 / 535 cc Left: 100 cc for a total of 570 / 535 cc  Discussed with patient I will discuss her appointment today with Dr. Ulice Bold and notify her we will need to order additional implants as she has more fluid in the expanders at this time.  Patient is interested in moving forward with surgery and reports she has not heard anything from our surgical scheduling team just yet.  Discussed with patient I will discuss the left breast area with Dr. Ulice Bold, may require ultrasound prior to surgery or may be able to excise intraoperatively for biopsy.  Pictures were obtained of the patient and placed in the chart with the patient's or guardian's permission.

## 2023-05-14 NOTE — Telephone Encounter (Signed)
Vanessa Medina called and said her expanders have flipped and she is requesting a call back from you.

## 2023-05-15 ENCOUNTER — Encounter: Payer: Self-pay | Admitting: *Deleted

## 2023-05-21 ENCOUNTER — Encounter
Payer: Medicaid Other | Attending: Physical Medicine and Rehabilitation | Admitting: Physical Medicine and Rehabilitation

## 2023-05-21 ENCOUNTER — Encounter: Payer: Self-pay | Admitting: Physical Medicine and Rehabilitation

## 2023-05-21 VITALS — BP 137/80 | HR 86 | Ht 64.0 in | Wt 255.0 lb

## 2023-05-21 DIAGNOSIS — M7918 Myalgia, other site: Secondary | ICD-10-CM | POA: Insufficient documentation

## 2023-05-21 DIAGNOSIS — M961 Postlaminectomy syndrome, not elsewhere classified: Secondary | ICD-10-CM | POA: Insufficient documentation

## 2023-05-21 DIAGNOSIS — S34109D Unspecified injury to unspecified level of lumbar spinal cord, subsequent encounter: Secondary | ICD-10-CM

## 2023-05-21 DIAGNOSIS — G8222 Paraplegia, incomplete: Secondary | ICD-10-CM | POA: Diagnosis not present

## 2023-05-21 DIAGNOSIS — Z6841 Body Mass Index (BMI) 40.0 and over, adult: Secondary | ICD-10-CM | POA: Diagnosis not present

## 2023-05-21 DIAGNOSIS — C50812 Malignant neoplasm of overlapping sites of left female breast: Secondary | ICD-10-CM | POA: Diagnosis not present

## 2023-05-21 DIAGNOSIS — S34109S Unspecified injury to unspecified level of lumbar spinal cord, sequela: Secondary | ICD-10-CM | POA: Insufficient documentation

## 2023-05-21 DIAGNOSIS — S34109A Unspecified injury to unspecified level of lumbar spinal cord, initial encounter: Secondary | ICD-10-CM | POA: Insufficient documentation

## 2023-05-21 DIAGNOSIS — Z170 Estrogen receptor positive status [ER+]: Secondary | ICD-10-CM | POA: Insufficient documentation

## 2023-05-21 MED ORDER — TOPIRAMATE 100 MG PO TABS: ORAL_TABLET | ORAL | 3 refills | Status: DC

## 2023-05-21 MED ORDER — BACLOFEN 10 MG PO TABS: 10.0000 mg | ORAL_TABLET | Freq: Three times a day (TID) | ORAL | 3 refills | Status: DC

## 2023-05-21 MED ORDER — LIDOCAINE HCL 1 % IJ SOLN
6.0000 mL | Freq: Once | INTRAMUSCULAR | Status: AC
Start: 2023-05-21 — End: 2023-05-21
  Administered 2023-05-21: 6 mL

## 2023-05-21 MED ORDER — GABAPENTIN 300 MG PO CAPS: 300.0000 mg | ORAL_CAPSULE | Freq: Every day | ORAL | 3 refills | Status: DC

## 2023-05-21 NOTE — Progress Notes (Signed)
Pt is a 52 yr old female with paraparesis/incomplete paraplegia due to attempted spinal cord stimulator placement- With chronic hx of orthostasis, BMI of 41 - back dwon from 45-  s/p gastric sleeve, Acute on chronic renal issues- CKD stage II,  here for f/u on SCI and neurogenic orthostatic hypotension. Vestibular issues due to trigger points and central issues.  Also had surgery on back in December 2023- L2-L3 laminectomy- at [facility]- Dr Sharolyn Douglas- Spine and Scoliosis- in [place], is Ortho spine per pt.  Here for f/u on ASIA D incomplete paraplegia and chronic pain.   Here for trigger point injections as well.   Larey Seat Saturday- trying to get out of sewing chair- chair went one way and she went the other- landed on tailbone- now really sore- spine and L hip hurts and R knee and ankle were also twisted.   Has muscle relaxers still- at home- has Robaxin at home-   Going in this month- to get expanders to be removed- having complications- and going ot put implants in.  Waiting on surgery date.   Still walking with cane.   Wants trigger point injections- today.      Plan: Has refill of Duloxetine-  2. Needs refill of Baclofen 10 mg TID- sent in 1 year supply to [facility]  3. Sent in refill of Topamax 100 mg nightly- #90- 3 refills  4. Sent in refills of Gabapentin 300 mg nightly- for nerve pain #90- 3 refills.   5.  Thinks has 500 mg- Methocarbamol- I suggest [phone] mg up to 3x/day as needed for the next 5-7 days.    6. We discussed tramadol for pain- pt doesn't want- which I'm fine with.   7 Takes Valium 2 mg- taking for muscle spasms- in chest from expanders.     8. Make sure no anti-inflammatories 5-7 days before surgery.  Can use voltaren gel- no issues.   9. Patient here for trigger point injections for  Consent done and on chart.  Cleaned areas with alcohol and injected using a 27 gauge 1.5 inch needle  Injected 6cc- none wasted Using 1% Lidocaine with no  EPI  Upper traps- B/L  Levators- B/L  Posterior scalenes- BL Middle scalenes B/L x2 Splenius Capitus- B/L  Pectoralis Major- cannot do due to expanders- EVER Rhomboids- B/L x2 Infraspinatus Teres Major/minor Thoracic paraspinals B/L  Lumbar paraspinals- B/L  Other injections-    Patient's level of pain prior was 8/10 Current level of pain after injections is- about the same- but improved ROM  There was no bleeding or complications.  Patient was advised to drink a lot of water on day after injections to flush system Will have increased soreness for 12-48 hours after injections.  Can use Lidocaine patches the day AFTER injections Can use theracane on day of injections in places didn't inject Can use heating pad 4-6 hours AFTER injections   10. F/U in 3 months- double appt- SCI/trigger point injections/breast CA   I spent a total of 41   minutes on total care today- >50% coordination of care- due to 10 minutes injections- rest discussing refills and if should continue same dose- decided- to- d/w pt about using Robaxin since fell=- all over pain and d/w pt about surgeries upcoming and recent due ot breast CA

## 2023-05-21 NOTE — Patient Instructions (Signed)
Plan: Has refill of Duloxetine-  2. Needs refill of Baclofen 10 mg TID- sent in 1 year supply to [facility]  3. Sent in refill of Topamax 100 mg nightly- #90- 3 refills  4. Sent in refills of Gabapentin 300 mg nightly- for nerve pain #90- 3 refills.   5.  Thinks has 500 mg- Methocarbamol- I suggest [phone] mg up to 3x/day as needed for the next 5-7 days.    6. We discussed tramadol for pain- pt doesn't want- which I'm fine with.   7 Takes Valium 2 mg- taking for muscle spasms- in chest from expanders.     8. Make sure no anti-inflammatories 5-7 days before surgery.  Can use voltaren gel- no issues.   9. Patient here for trigger point injections for  Consent done and on chart.  Cleaned areas with alcohol and injected using a 27 gauge 1.5 inch needle  Injected 6cc- none wasted Using 1% Lidocaine with no EPI  Upper traps- B/L  Levators- B/L  Posterior scalenes- BL Middle scalenes B/L x2 Splenius Capitus- B/L  Pectoralis Major- cannot do due to expanders- EVER Rhomboids- B/L x2 Infraspinatus Teres Major/minor Thoracic paraspinals B/L  Lumbar paraspinals- B/L  Other injections-    Patient's level of pain prior was 8/10 Current level of pain after injections is- about the same- but improved ROM  There was no bleeding or complications.  Patient was advised to drink a lot of water on day after injections to flush system Will have increased soreness for 12-48 hours after injections.  Can use Lidocaine patches the day AFTER injections Can use theracane on day of injections in places didn't inject Can use heating pad 4-6 hours AFTER injections   10. F/U in 3 months- double appt- SCI/trigger point injections/breast CA

## 2023-05-21 NOTE — Addendum Note (Signed)
Addended by: Doreene Eland on: 05/21/2023 12:18 PM   Modules accepted: Orders

## 2023-05-25 ENCOUNTER — Inpatient Hospital Stay: Payer: Medicaid Other | Attending: Internal Medicine

## 2023-05-25 NOTE — Progress Notes (Signed)
Nutrition Follow-up:  Patient with left ER/PR positive, HER2 negative breast cancer.  S/p bilateral mastectomies with bilateral reconstruction, multiple complications.  Not planning radiation. Currently on tamoxifen.    Met with patient in clinic for nutrition follow-up.  Patient reports that appetite has improved.  She has been eating hard boiled egg for breakfast.  Lunch has been chef salad and dinner has been meat and vegetables.  Has a cup of yogurt before bed.  Says that she has been using an instant pot recently to prepare meals.  Has been drinking 1 ensure clear a day.  Says that her taste for food has come back and not having any issues with nausea.  Says that she restarted bariatric MVI.      Medications: reviewed  Labs: reviewed  Anthropometrics:   Weight 257 lb today in clinic 253 lb 2 oz on 8/23   NUTRITION DIAGNOSIS: Inadequate oral intake improved   INTERVENTION:  Continue well balanced diet including good sources of protein especially with upcoming surgery Continue bariatric MVI Patient to call RD if needed in the future   NEXT VISIT: no follow-up  Vanessa Medina, RD, LDN Registered Dietitian [phone]

## 2023-05-29 ENCOUNTER — Ambulatory Visit (INDEPENDENT_AMBULATORY_CARE_PROVIDER_SITE_OTHER): Payer: Self-pay | Admitting: Surgical

## 2023-05-29 DIAGNOSIS — Z9889 Other specified postprocedural states: Secondary | ICD-10-CM

## 2023-05-29 DIAGNOSIS — Z9013 Acquired absence of bilateral breasts and nipples: Secondary | ICD-10-CM

## 2023-05-29 MED ORDER — HYDROCODONE-ACETAMINOPHEN 5-325 MG PO TABS: 1.0000 | ORAL_TABLET | Freq: Four times a day (QID) | ORAL | 0 refills | Status: DC | PRN

## 2023-05-29 NOTE — Progress Notes (Signed)
Patient is a 52 year old female with history of bilateral breast reconstruction, she has had some issues with the left breast tissue expander flipping and is causing her severe pain.  We have discussed this before and have attempted to get her on the OR schedule very quickly, but we are currently waiting for insurance authorization.  Patient reports that she received a letter in the mail about requiring a peer to peer.  She reports she is having a lot of pain in the left breast, reports the expanders currently flipped over.  She reports she is using Tylenol and the Valium as needed for pain control but this is not providing her enough pain relief.  She is otherwise doing okay, but would like to have the expander exchanged for an implant as soon as possible.  I discussed with the patient that I completely understand and I have checked with our surgical schedulers to determine if the process can be expedited, and if a peer to peer is necessary then I would be more than happy to complete that today for her.   Discussed with patient I would notify her of any updates with concerns and we can add her to the surgical schedule as soon as possible pending OR availability.  Discussed with patient I could send her a few doses of hydrocodone/Norco for her to use for severe pain as needed.   The patient gave consent to have this visit done by telemedicine / virtual visit, two identifiers were used to identify patient. This is also consent for access the chart and treat the patient via this visit. The patient is located in [place].  I, the provider, am at the office.  We spent 5 minutes together for the visit.  Joined by telephone.

## 2023-05-30 ENCOUNTER — Other Ambulatory Visit: Payer: Self-pay

## 2023-05-30 ENCOUNTER — Encounter (HOSPITAL_BASED_OUTPATIENT_CLINIC_OR_DEPARTMENT_OTHER): Payer: Self-pay | Admitting: Plastic Surgery

## 2023-05-31 ENCOUNTER — Other Ambulatory Visit: Payer: Self-pay | Admitting: Internal Medicine

## 2023-05-31 ENCOUNTER — Ambulatory Visit (INDEPENDENT_AMBULATORY_CARE_PROVIDER_SITE_OTHER): Payer: Medicaid Other | Admitting: Surgical

## 2023-05-31 ENCOUNTER — Encounter: Payer: Self-pay | Admitting: Surgical

## 2023-05-31 VITALS — BP 138/87 | HR 70 | Ht 64.0 in | Wt 256.0 lb

## 2023-05-31 DIAGNOSIS — Z9889 Other specified postprocedural states: Secondary | ICD-10-CM

## 2023-05-31 DIAGNOSIS — Z9013 Acquired absence of bilateral breasts and nipples: Secondary | ICD-10-CM

## 2023-05-31 MED ORDER — HYDROCODONE-ACETAMINOPHEN 5-325 MG PO TABS: 1.0000 | ORAL_TABLET | Freq: Four times a day (QID) | ORAL | 0 refills | Status: AC | PRN

## 2023-05-31 MED ORDER — CEPHALEXIN 500 MG PO CAPS: 500.0000 mg | ORAL_CAPSULE | Freq: Four times a day (QID) | ORAL | 0 refills | Status: AC

## 2023-05-31 MED ORDER — ONDANSETRON HCL 4 MG PO TABS: 4.0000 mg | ORAL_TABLET | Freq: Three times a day (TID) | ORAL | 0 refills | Status: DC | PRN

## 2023-05-31 NOTE — H&P (View-Only) (Signed)
Patient ID: Vanessa Medina, female    DOB: [birth year], 52 y.o.   MRN: [id number]  Chief Complaint  Patient presents with   Pre-op Exam      ICD-10-CM   1. S/P mastectomy, bilateral  Z90.13     2. S/P breast reconstruction  Z98.890       History of Present Illness: Vanessa Medina is a 52 y.o.  female  with a history of bilateral breast reconstruction.  She presents for preoperative evaluation for upcoming procedure, removal of bilateral breast tissue expanders and placement of bilateral silicone breast implants, scheduled for 06/06/2023 with Dr. Ulice Bold.  Patient does have a lesion of her left breast that appears to be consistent with a cyst, however given her history we will biopsy this day of surgery.  It seems to have ruptured and now has an overlying scab.  I do palpate the mass under the breast skin feels to be about 3 x 3 mm.  No active drainage.  No surrounding erythema.  It is tender to palpation.  Vanessa Medina reports a history of nausea/vomiting with anesthesia, but reports scopolamine patch has been very helpful in the past.  No history of DVT/PE.  No family history of DVT/PE.  No family or personal history of bleeding or clotting disorders.  Patient is not currently taking any blood thinners.  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  She was previously on ASA 81 mg, but is no longer taking this.  Summary of Visit: Patient with history of bilateral breast reconstruction, she currently has tissue expanders in place.  She did return to the OR once for replacement of left breast tissue expander due to rupture.  She is now having issues with the left breast tissue expander flipping in the breast pocket.  She is having a lot of pain and tenderness associated with this.  She is happy with the current size of the implants and is ready for exchange.  She would like silicone implants.  She currently has 570/535 cc in  each tissue expander.  Job: Retired  PMH Significant for: Anemia, anxiety, history of migraines (well-controlled at this time), reports history of OSA, but reports she no longer has this.   Patient reports she is overall feeling well, she does have a lot of tenderness of her left breast due to the expander continuing to flip in the left breast pocket.  She has been using the Norco as needed for pain control, but has been mostly using Tylenol and ibuprofen as she does not like the way the hydrocodone makes her feel.   Past Medical History: Allergies: Allergies  Allergen Reactions   Bee Venom Swelling   Iodinated Contrast Media Anaphylaxis    Swelling  Other reaction(s): Other (See Comments)  Swelling   Swelling   Latex Hives and Rash   Morphine Other (See Comments)    BRADYCARDIA   Shellfish Allergy Anaphylaxis   Codeine Nausea And Vomiting   Meperidine Hcl Other (See Comments)    BRADYCARDIA   Bee Pollen Other (See Comments)    Unknown   Oxycodone Hives and Itching    Blisters on back   Tape Hives    Adhesive   Pentazocine Lactate Nausea And Vomiting    REACTION: vomiting with Talwin NX   Propoxyphene Nausea Only and Nausea And Vomiting    Current Medications:  Current Outpatient Medications:    baclofen (  LIORESAL) 10 MG tablet, Take 1 tablet (10 mg total) by mouth 3 (three) times daily. - for spasms, Disp: 270 tablet, Rfl: 3   [START ON 06/04/2023] cephALEXin (KEFLEX) 500 MG capsule, Take 1 capsule (500 mg total) by mouth 4 (four) times daily for 5 days., Disp: 20 capsule, Rfl: 0   diazepam (VALIUM) 2 MG tablet, Take 1 tablet (2 mg total) by mouth every 12 (twelve) hours as needed for up to 5 doses for muscle spasms., Disp: 5 tablet, Rfl: 0   diclofenac Sodium (VOLTAREN) 1 % GEL, Apply 2 g topically 4 (four) times daily., Disp: 50 g, Rfl: 5   DULoxetine (CYMBALTA) 60 MG capsule, Take 60 mg by mouth daily., Disp: , Rfl:    EPINEPHrine 0.3 mg/0.3 mL IJ SOAJ injection, ,  Disp: , Rfl:    gabapentin (NEURONTIN) 300 MG capsule, Take 1 capsule (300 mg total) by mouth at bedtime. For nerve pain, Disp: 90 capsule, Rfl: 3   [START ON 06/04/2023] HYDROcodone-acetaminophen (NORCO) 5-325 MG tablet, Take 1 tablet by mouth every 6 (six) hours as needed for up to 5 days for severe pain., Disp: 20 tablet, Rfl: 0   methocarbamol (ROBAXIN) 500 MG tablet, Take 500 mg by mouth 4 (four) times daily., Disp: , Rfl:    montelukast (SINGULAIR) 10 MG tablet, TAKE 1 TABLET BY MOUTH AT  BEDTIME FOR ALLERGIES, Disp: 30 tablet, Rfl: 1   Multiple Vitamin (MULTIVITAMIN WITH MINERALS) TABS tablet, Take 1 tablet by mouth 2 (two) times daily., Disp: , Rfl:    [START ON 06/04/2023] ondansetron (ZOFRAN) 4 MG tablet, Take 1 tablet (4 mg total) by mouth every 8 (eight) hours as needed for nausea or vomiting., Disp: 20 tablet, Rfl: 0   pramipexole (MIRAPEX) 0.125 MG tablet, Take by mouth., Disp: , Rfl:    pyridOXINE (VITAMIN B-6) 100 MG tablet, Take 1 tablet (100 mg total) by mouth daily., Disp: 90 tablet, Rfl: 1   tamoxifen (NOLVADEX) 20 MG tablet, Take 1 tablet (20 mg total) by mouth daily., Disp: 90 tablet, Rfl: 0   tolterodine (DETROL LA) 4 MG 24 hr capsule, Take 1 capsule (4 mg total) by mouth daily., Disp: 90 capsule, Rfl: 0   topiramate (TOPAMAX) 100 MG tablet, TAKE 1 TABLET BY MOUTH AT  BEDTIME, Disp: 90 tablet, Rfl: 3   triamcinolone (KENALOG) 0.025 % ointment, Apply 1 Application topically 2 (two) times daily., Disp: 30 g, Rfl: 0   vitamin B-12 (CYANOCOBALAMIN) 1000 MCG tablet, Take 1 tablet (1,000 mcg total) by mouth daily., Disp: 90 tablet, Rfl: 1   azelastine (ASTELIN) 0.1 % nasal spray, Place into the nose., Disp: , Rfl:    diclofenac Sodium (VOLTAREN) 1 % GEL, APPLY 4 GRAMS TO AFFECTED  AREA(S) TOPICALLY 4 TIMES DAILY (Patient taking differently: as needed.), Disp: 1500 g, Rfl: 1  Past Medical Problems: Past Medical History:  Diagnosis Date   Anemia    Anxiety    Breast cancer (HCC)  01/18/2023   left breast   Cervical cancer (HCC)    Chronic kidney disease    Previous now clear   Claustrophobia    Depression    Family history of adverse reaction to anesthesia     " my mother takes a long time time wake up"   History of dysplastic nevus 01/18/2018   right shoulder, recurrent dysplastic nevus   History of kidney stones    History of shingles    Migraine    Multiple allergies  Obesity    Osteoarthritis    left hip   Pneumonia    PONV (postoperative nausea and vomiting)    slow to wake up   Sleep apnea    does not wear CPAP   Slow to wake up after anesthesia    Stroke (HCC) 08/11/2020   spinal cord stroke, spinal cord puncture during surgery   Wears glasses     Past Surgical History: Past Surgical History:  Procedure Laterality Date   ABDOMINAL HYSTERECTOMY     APPENDECTOMY     BACK SURGERY     BILIOPANCREATIC DIVISION W DUODENAL SWITCH N/A    BREAST BIOPSY Left 12/06/2022   u/s bx,1:00 heart clip, path pending   BREAST BIOPSY Left 12/06/2022   [name] bx 2:00 ribbon clip path pending   BREAST BIOPSY Left 12/06/2022   [name] LT BREAST BX W LOC DEV EA ADD LESION IMG BX SPEC US GUIDE 12/06/2022 [facility]   BREAST BIOPSY Left 12/06/2022   [name] LT BREAST BX W LOC DEV 1ST LESION IMG BX SPEC US GUIDE 12/06/2022 [facility]   BREAST RECONSTRUCTION WITH PLACEMENT OF TISSUE EXPANDER AND FLEX HD (ACELLULAR HYDRATED DERMIS) Bilateral 01/18/2023   Procedure: IMMEDIATE LEFT BREAST RECONSTRUCTION WITH PLACEMENT OF TISSUE EXPANDER AND FLEX HD (ACELLULAR HYDRATED DERMIS);  Surgeon: Peggye Form, DO;  Location: [facility];  Service: Plastics;  Laterality: Bilateral;   BREAST RECONSTRUCTION WITH PLACEMENT OF TISSUE EXPANDER AND FLEX HD (ACELLULAR HYDRATED DERMIS) Left 03/26/2023   Procedure: BREAST RECONSTRUCTION WITH PLACEMENT OF TISSUE EXPANDER;  Surgeon: Peggye Form, DO;  Location: [facility];  Service: Plastics;  Laterality: Left;    CHOLECYSTECTOMY     COLONOSCOPY N/A 06/27/2021   Procedure: COLONOSCOPY;  Surgeon: Jaynie Collins, DO;  Location: [facility];  Service: Gastroenterology;  Laterality: N/A;   DIAGNOSTIC LAPAROSCOPY     LOA   ESOPHAGOGASTRODUODENOSCOPY N/A 06/27/2021   Procedure: ESOPHAGOGASTRODUODENOSCOPY (EGD);  Surgeon: Jaynie Collins, DO;  Location: [facility];  Service: Gastroenterology;  Laterality: N/A;   ESOPHAGOGASTRODUODENOSCOPY (EGD) WITH PROPOFOL N/A 07/25/2017   Procedure: ESOPHAGOGASTRODUODENOSCOPY (EGD) WITH PROPOFOL;  Surgeon: Scot Jun, MD;  Location: [facility] ENDOSCOPY;  Service: Endoscopy;  Laterality: N/A;   KNEE ARTHROSCOPY     LAPAROSCOPIC GASTRIC SLEEVE RESECTION WITH HIATAL HERNIA REPAIR     LUMBAR FUSION  08/29/2022   Revision Lumbar two through five Laminectomy & Fusion with Transforaminal Lumbar interbody fusion   MASTECTOMY W/ SENTINEL NODE BIOPSY Bilateral 01/18/2023   Procedure: MASTECTOMY WITH SENTINEL LYMPH NODE BIOPSY, RNFA to assist;  Surgeon: Leafy Ro, MD;  Location: [facility];  Service: General;  Laterality: Bilateral;   TONSILLECTOMY     TOTAL HIP ARTHROPLASTY Left 01/26/2016   Procedure: LEFT TOTAL HIP ARTHROPLASTY ANTERIOR APPROACH;  Surgeon: Tarry Kos, MD;  Location: [facility];  Service: Orthopedics;  Laterality: Left;   TRANSFORAMINAL LUMBAR INTERBODY FUSION (TLIF) WITH PEDICLE SCREW FIXATION 3 LEVEL  08/2019   Vira Browns, MD L2-L5    Social History: Social History   Socioeconomic History   Marital status: Divorced    Spouse name: Not on file   Number of children: 1   Years of education: Not on file   Highest education level: Not on file  Occupational History   Not on file  Tobacco Use   Smoking status: Never    Passive exposure: Never   Smokeless tobacco: Never  Vaping Use   Vaping status: Never Used  Substance and Sexual Activity  Alcohol use: Yes    Comment: occasional wine   Drug use: No   Sexual activity: Not  Currently    Birth control/protection: Surgical    Comment: Hyst  Other Topics Concern   Not on file  Social History Narrative   Right handed    Uses cane to walk    Son has Medical and legal POA.   Social Determinants of Health   Financial Resource Strain: Medium Risk (12/11/2022)   Overall Financial Resource Strain (CARDIA)    Difficulty of Paying Living Expenses: Somewhat hard  Food Insecurity: No Food Insecurity (01/18/2023)   Hunger Vital Sign    Worried About Running Out of Food in the Last Year: Never true    Ran Out of Food in the Last Year: Never true  Recent Concern: Food Insecurity - Food Insecurity Present (12/08/2022)   Hunger Vital Sign    Worried About Running Out of Food in the Last Year: Often true    Ran Out of Food in the Last Year: Often true  Transportation Needs: No Transportation Needs (01/18/2023)   PRAPARE - Administrator, Civil Service (Medical): No    Lack of Transportation (Non-Medical): No  Physical Activity: Inactive (12/11/2022)   Exercise Vital Sign    Days of Exercise per Week: 0 days    Minutes of Exercise per Session: 0 min  Stress: Stress Concern Present (12/11/2022)   [employer] of Occupational Health - Occupational Stress Questionnaire    Feeling of Stress : To some extent  Social Connections: Moderately Isolated (12/11/2022)   Social Connection and Isolation Panel [NHANES]    Frequency of Communication with Friends and Family: Three times a week    Frequency of Social Gatherings with Friends and Family: Twice a week    Attends Religious Services: 1 to 4 times per year    Active Member of [employer] or Organizations: No    Attends Banker Meetings: Never    Marital Status: Divorced  Catering manager Violence: Not At Risk (01/18/2023)   Humiliation, Afraid, Rape, and Kick questionnaire    Fear of Current or Ex-Partner: No    Emotionally Abused: No    Physically Abused: No    Sexually Abused: No    Family  History: Family History  Problem Relation Age of Onset   Renal Disease Mother    Hypertension Mother    Sudden Cardiac Death Mother    Heart failure Mother    Valvular heart disease Mother    Heart disease Mother    Breast cancer Maternal Grandmother    Stroke Brother    Heart attack Brother 63   Diabetes Other     Review of Systems: Review of Systems  Constitutional: Negative.   Respiratory: Negative.    Cardiovascular: Negative.   Gastrointestinal: Negative.   Musculoskeletal:  Positive for myalgias (Left breast pain).  Neurological: Negative.     Physical Exam: Vital Signs BP 138/87 (BP Location: Left Arm, Patient Position: Sitting, Cuff Size: Large)   Pulse 70   Ht 5\' 4"  (1.626 m)   Wt 256 lb (116.1 kg)   SpO2 98%   BMI 43.94 kg/m   Physical Exam Constitutional:      General: Not in acute distress.    Appearance: Normal appearance. Not ill-appearing.  HENT:     Head: Normocephalic and atraumatic.  Eyes:     Pupils: Pupils are equal, round Neck:     Musculoskeletal: Normal range of motion.  Cardiovascular:     Rate and Rhythm: Normal rate    Pulses: Normal pulses.  Pulmonary:     Effort: Pulmonary effort is normal. No respiratory distress.  Abdominal:     General: Abdomen is flat. There is no distension.  Musculoskeletal: Normal range of motion.  Skin:    General: Skin is warm and dry.     Findings: No erythema or rash.  Neurological:     General: No focal deficit present.     Mental Status: Alert and oriented to person, place, and time. Mental status is at baseline.     Motor: No weakness.  Psychiatric:        Mood and Affect: Mood normal.        Behavior: Behavior normal.    Assessment/Plan: The patient is scheduled for exchange of bilateral breast tissue expanders for bilateral breast implants with Dr. Ulice Bold.  Risks, benefits, and alternatives of procedure discussed, questions answered and consent obtained.    Smoking Status: Never  smoked Last Mammogram: Status post bilateral mastectomy  Caprini Score: 8; Risk Factors include: Age, history of cancer, currently on tamoxifen, BMI > 40, and length of planned surgery. Recommendation for mechanical and possible pharmacological prophylaxis. Encourage early ambulation.  Will discuss possible need for postoperative Lovenox with Dr. Ulice Bold.  Pictures obtained: Pictures previously taken  Post-op Rx sent to pharmacy: Oxycodone, Zofran, Keflex  Patient was provided with the General Surgical Risk consent document and Pain Medication Agreement prior to their appointment.  They had adequate time to read through the risk consent documents and Pain Medication Agreement. We also discussed them in person together during this preop appointment. All of their questions were answered to their satisfaction.  Recommended calling if they have any further questions.  Risk consent form and Pain Medication Agreement to be scanned into patient's chart.  Patient was provided with the Mentor implant patient decision checklist and this was completed during today's preoperative evaluation. Patient had time to read through the information and any questions were answered to their content. Form will be scanned into patient's chart.  The risks that can be encountered with and after placement of a breast implant were discussed and include the following but not limited to these: bleeding, infection, delayed healing, anesthesia risks, skin sensation changes, injury to structures including nerves, blood vessels, and muscles which may be temporary or permanent, allergies to tape, suture materials and glues, blood products, topical preparations or injected agents, skin contour irregularities, skin discoloration and swelling, deep vein thrombosis, cardiac and pulmonary complications, pain, which may persist, fluid accumulation, wrinkling of the skin over the implanmt, changes in nipple or breast sensation, implant  leakage or rupture, faulty position of the implant, persistent pain, formation of tight scar tissue around the implant (capsular contracture).  Patient is not taking ASA 81 mg at this time.  Patient is on tamoxifen, she has previously held this, discussed with patient recommendations for holding it prior to surgery and after surgery for 2 weeks.  She is unable to hold for 2 weeks prior to surgery due to surgery being scheduled recently.  Electronically signed by: Kermit Balo Wylan Gentzler, PA-C 05/31/2023 10:19 AM

## 2023-05-31 NOTE — Progress Notes (Signed)
Patient ID: Vanessa Medina, female    DOB: [birth year], 52 y.o.   MRN: [id number]  Chief Complaint  Patient presents with   Pre-op Exam      ICD-10-CM   1. S/P mastectomy, bilateral  Z90.13     2. S/P breast reconstruction  Z98.890       History of Present Illness: Vanessa Medina is a 52 y.o.  female  with a history of bilateral breast reconstruction.  She presents for preoperative evaluation for upcoming procedure, removal of bilateral breast tissue expanders and placement of bilateral silicone breast implants, scheduled for 06/06/2023 with Dr. Ulice Bold.  Patient does have a lesion of her left breast that appears to be consistent with a cyst, however given her history we will biopsy this day of surgery.  It seems to have ruptured and now has an overlying scab.  I do palpate the mass under the breast skin feels to be about 3 x 3 mm.  No active drainage.  No surrounding erythema.  It is tender to palpation.  Vanessa Medina reports a history of nausea/vomiting with anesthesia, but reports scopolamine patch has been very helpful in the past.  No history of DVT/PE.  No family history of DVT/PE.  No family or personal history of bleeding or clotting disorders.  Patient is not currently taking any blood thinners.  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  She was previously on ASA 81 mg, but is no longer taking this.  Summary of Visit: Patient with history of bilateral breast reconstruction, she currently has tissue expanders in place.  She did return to the OR once for replacement of left breast tissue expander due to rupture.  She is now having issues with the left breast tissue expander flipping in the breast pocket.  She is having a lot of pain and tenderness associated with this.  She is happy with the current size of the implants and is ready for exchange.  She would like silicone implants.  She currently has 570/535 cc in  each tissue expander.  Job: Retired  PMH Significant for: Anemia, anxiety, history of migraines (well-controlled at this time), reports history of OSA, but reports she no longer has this.   Patient reports she is overall feeling well, she does have a lot of tenderness of her left breast due to the expander continuing to flip in the left breast pocket.  She has been using the Norco as needed for pain control, but has been mostly using Tylenol and ibuprofen as she does not like the way the hydrocodone makes her feel.   Past Medical History: Allergies: Allergies  Allergen Reactions   Bee Venom Swelling   Iodinated Contrast Media Anaphylaxis    Swelling  Other reaction(s): Other (See Comments)  Swelling   Swelling   Latex Hives and Rash   Morphine Other (See Comments)    BRADYCARDIA   Shellfish Allergy Anaphylaxis   Codeine Nausea And Vomiting   Meperidine Hcl Other (See Comments)    BRADYCARDIA   Bee Pollen Other (See Comments)    Unknown   Oxycodone Hives and Itching    Blisters on back   Tape Hives    Adhesive   Pentazocine Lactate Nausea And Vomiting    REACTION: vomiting with Talwin NX   Propoxyphene Nausea Only and Nausea And Vomiting    Current Medications:  Current Outpatient Medications:    baclofen (  LIORESAL) 10 MG tablet, Take 1 tablet (10 mg total) by mouth 3 (three) times daily. - for spasms, Disp: 270 tablet, Rfl: 3   [START ON 06/04/2023] cephALEXin (KEFLEX) 500 MG capsule, Take 1 capsule (500 mg total) by mouth 4 (four) times daily for 5 days., Disp: 20 capsule, Rfl: 0   diazepam (VALIUM) 2 MG tablet, Take 1 tablet (2 mg total) by mouth every 12 (twelve) hours as needed for up to 5 doses for muscle spasms., Disp: 5 tablet, Rfl: 0   diclofenac Sodium (VOLTAREN) 1 % GEL, Apply 2 g topically 4 (four) times daily., Disp: 50 g, Rfl: 5   DULoxetine (CYMBALTA) 60 MG capsule, Take 60 mg by mouth daily., Disp: , Rfl:    EPINEPHrine 0.3 mg/0.3 mL IJ SOAJ injection, ,  Disp: , Rfl:    gabapentin (NEURONTIN) 300 MG capsule, Take 1 capsule (300 mg total) by mouth at bedtime. For nerve pain, Disp: 90 capsule, Rfl: 3   [START ON 06/04/2023] HYDROcodone-acetaminophen (NORCO) 5-325 MG tablet, Take 1 tablet by mouth every 6 (six) hours as needed for up to 5 days for severe pain., Disp: 20 tablet, Rfl: 0   methocarbamol (ROBAXIN) 500 MG tablet, Take 500 mg by mouth 4 (four) times daily., Disp: , Rfl:    montelukast (SINGULAIR) 10 MG tablet, TAKE 1 TABLET BY MOUTH AT  BEDTIME FOR ALLERGIES, Disp: 30 tablet, Rfl: 1   Multiple Vitamin (MULTIVITAMIN WITH MINERALS) TABS tablet, Take 1 tablet by mouth 2 (two) times daily., Disp: , Rfl:    [START ON 06/04/2023] ondansetron (ZOFRAN) 4 MG tablet, Take 1 tablet (4 mg total) by mouth every 8 (eight) hours as needed for nausea or vomiting., Disp: 20 tablet, Rfl: 0   pramipexole (MIRAPEX) 0.125 MG tablet, Take by mouth., Disp: , Rfl:    pyridOXINE (VITAMIN B-6) 100 MG tablet, Take 1 tablet (100 mg total) by mouth daily., Disp: 90 tablet, Rfl: 1   tamoxifen (NOLVADEX) 20 MG tablet, Take 1 tablet (20 mg total) by mouth daily., Disp: 90 tablet, Rfl: 0   tolterodine (DETROL LA) 4 MG 24 hr capsule, Take 1 capsule (4 mg total) by mouth daily., Disp: 90 capsule, Rfl: 0   topiramate (TOPAMAX) 100 MG tablet, TAKE 1 TABLET BY MOUTH AT  BEDTIME, Disp: 90 tablet, Rfl: 3   triamcinolone (KENALOG) 0.025 % ointment, Apply 1 Application topically 2 (two) times daily., Disp: 30 g, Rfl: 0   vitamin B-12 (CYANOCOBALAMIN) 1000 MCG tablet, Take 1 tablet (1,000 mcg total) by mouth daily., Disp: 90 tablet, Rfl: 1   azelastine (ASTELIN) 0.1 % nasal spray, Place into the nose., Disp: , Rfl:    diclofenac Sodium (VOLTAREN) 1 % GEL, APPLY 4 GRAMS TO AFFECTED  AREA(S) TOPICALLY 4 TIMES DAILY (Patient taking differently: as needed.), Disp: 1500 g, Rfl: 1  Past Medical Problems: Past Medical History:  Diagnosis Date   Anemia    Anxiety    Breast cancer (HCC)  01/18/2023   left breast   Cervical cancer (HCC)    Chronic kidney disease    Previous now clear   Claustrophobia    Depression    Family history of adverse reaction to anesthesia     " my mother takes a long time time wake up"   History of dysplastic nevus 01/18/2018   right shoulder, recurrent dysplastic nevus   History of kidney stones    History of shingles    Migraine    Multiple allergies  Obesity    Osteoarthritis    left hip   Pneumonia    PONV (postoperative nausea and vomiting)    slow to wake up   Sleep apnea    does not wear CPAP   Slow to wake up after anesthesia    Stroke (HCC) 08/11/2020   spinal cord stroke, spinal cord puncture during surgery   Wears glasses     Past Surgical History: Past Surgical History:  Procedure Laterality Date   ABDOMINAL HYSTERECTOMY     APPENDECTOMY     BACK SURGERY     BILIOPANCREATIC DIVISION W DUODENAL SWITCH N/A    BREAST BIOPSY Left 12/06/2022   u/s bx,1:00 heart clip, path pending   BREAST BIOPSY Left 12/06/2022   [name] bx 2:00 ribbon clip path pending   BREAST BIOPSY Left 12/06/2022   [name] LT BREAST BX W LOC DEV EA ADD LESION IMG BX SPEC US GUIDE 12/06/2022 [facility]   BREAST BIOPSY Left 12/06/2022   [name] LT BREAST BX W LOC DEV 1ST LESION IMG BX SPEC US GUIDE 12/06/2022 [facility]   BREAST RECONSTRUCTION WITH PLACEMENT OF TISSUE EXPANDER AND FLEX HD (ACELLULAR HYDRATED DERMIS) Bilateral 01/18/2023   Procedure: IMMEDIATE LEFT BREAST RECONSTRUCTION WITH PLACEMENT OF TISSUE EXPANDER AND FLEX HD (ACELLULAR HYDRATED DERMIS);  Surgeon: Peggye Form, DO;  Location: [facility];  Service: Plastics;  Laterality: Bilateral;   BREAST RECONSTRUCTION WITH PLACEMENT OF TISSUE EXPANDER AND FLEX HD (ACELLULAR HYDRATED DERMIS) Left 03/26/2023   Procedure: BREAST RECONSTRUCTION WITH PLACEMENT OF TISSUE EXPANDER;  Surgeon: Peggye Form, DO;  Location: [facility];  Service: Plastics;  Laterality: Left;    CHOLECYSTECTOMY     COLONOSCOPY N/A 06/27/2021   Procedure: COLONOSCOPY;  Surgeon: Jaynie Collins, DO;  Location: [facility];  Service: Gastroenterology;  Laterality: N/A;   DIAGNOSTIC LAPAROSCOPY     LOA   ESOPHAGOGASTRODUODENOSCOPY N/A 06/27/2021   Procedure: ESOPHAGOGASTRODUODENOSCOPY (EGD);  Surgeon: Jaynie Collins, DO;  Location: [facility];  Service: Gastroenterology;  Laterality: N/A;   ESOPHAGOGASTRODUODENOSCOPY (EGD) WITH PROPOFOL N/A 07/25/2017   Procedure: ESOPHAGOGASTRODUODENOSCOPY (EGD) WITH PROPOFOL;  Surgeon: Scot Jun, MD;  Location: [facility] ENDOSCOPY;  Service: Endoscopy;  Laterality: N/A;   KNEE ARTHROSCOPY     LAPAROSCOPIC GASTRIC SLEEVE RESECTION WITH HIATAL HERNIA REPAIR     LUMBAR FUSION  08/29/2022   Revision Lumbar two through five Laminectomy & Fusion with Transforaminal Lumbar interbody fusion   MASTECTOMY W/ SENTINEL NODE BIOPSY Bilateral 01/18/2023   Procedure: MASTECTOMY WITH SENTINEL LYMPH NODE BIOPSY, RNFA to assist;  Surgeon: Leafy Ro, MD;  Location: [facility];  Service: General;  Laterality: Bilateral;   TONSILLECTOMY     TOTAL HIP ARTHROPLASTY Left 01/26/2016   Procedure: LEFT TOTAL HIP ARTHROPLASTY ANTERIOR APPROACH;  Surgeon: Tarry Kos, MD;  Location: [facility];  Service: Orthopedics;  Laterality: Left;   TRANSFORAMINAL LUMBAR INTERBODY FUSION (TLIF) WITH PEDICLE SCREW FIXATION 3 LEVEL  08/2019   Vira Browns, MD L2-L5    Social History: Social History   Socioeconomic History   Marital status: Divorced    Spouse name: Not on file   Number of children: 1   Years of education: Not on file   Highest education level: Not on file  Occupational History   Not on file  Tobacco Use   Smoking status: Never    Passive exposure: Never   Smokeless tobacco: Never  Vaping Use   Vaping status: Never Used  Substance and Sexual Activity  Alcohol use: Yes    Comment: occasional wine   Drug use: No   Sexual activity: Not  Currently    Birth control/protection: Surgical    Comment: Hyst  Other Topics Concern   Not on file  Social History Narrative   Right handed    Uses cane to walk    Son has Medical and legal POA.   Social Determinants of Health   Financial Resource Strain: Medium Risk (12/11/2022)   Overall Financial Resource Strain (CARDIA)    Difficulty of Paying Living Expenses: Somewhat hard  Food Insecurity: No Food Insecurity (01/18/2023)   Hunger Vital Sign    Worried About Running Out of Food in the Last Year: Never true    Ran Out of Food in the Last Year: Never true  Recent Concern: Food Insecurity - Food Insecurity Present (12/08/2022)   Hunger Vital Sign    Worried About Running Out of Food in the Last Year: Often true    Ran Out of Food in the Last Year: Often true  Transportation Needs: No Transportation Needs (01/18/2023)   PRAPARE - Administrator, Civil Service (Medical): No    Lack of Transportation (Non-Medical): No  Physical Activity: Inactive (12/11/2022)   Exercise Vital Sign    Days of Exercise per Week: 0 days    Minutes of Exercise per Session: 0 min  Stress: Stress Concern Present (12/11/2022)   [employer] of Occupational Health - Occupational Stress Questionnaire    Feeling of Stress : To some extent  Social Connections: Moderately Isolated (12/11/2022)   Social Connection and Isolation Panel [NHANES]    Frequency of Communication with Friends and Family: Three times a week    Frequency of Social Gatherings with Friends and Family: Twice a week    Attends Religious Services: 1 to 4 times per year    Active Member of [employer] or Organizations: No    Attends Banker Meetings: Never    Marital Status: Divorced  Catering manager Violence: Not At Risk (01/18/2023)   Humiliation, Afraid, Rape, and Kick questionnaire    Fear of Current or Ex-Partner: No    Emotionally Abused: No    Physically Abused: No    Sexually Abused: No    Family  History: Family History  Problem Relation Age of Onset   Renal Disease Mother    Hypertension Mother    Sudden Cardiac Death Mother    Heart failure Mother    Valvular heart disease Mother    Heart disease Mother    Breast cancer Maternal Grandmother    Stroke Brother    Heart attack Brother 63   Diabetes Other     Review of Systems: Review of Systems  Constitutional: Negative.   Respiratory: Negative.    Cardiovascular: Negative.   Gastrointestinal: Negative.   Musculoskeletal:  Positive for myalgias (Left breast pain).  Neurological: Negative.     Physical Exam: Vital Signs BP 138/87 (BP Location: Left Arm, Patient Position: Sitting, Cuff Size: Large)   Pulse 70   Ht 5\' 4"  (1.626 m)   Wt 256 lb (116.1 kg)   SpO2 98%   BMI 43.94 kg/m   Physical Exam Constitutional:      General: Not in acute distress.    Appearance: Normal appearance. Not ill-appearing.  HENT:     Head: Normocephalic and atraumatic.  Eyes:     Pupils: Pupils are equal, round Neck:     Musculoskeletal: Normal range of motion.  Cardiovascular:     Rate and Rhythm: Normal rate    Pulses: Normal pulses.  Pulmonary:     Effort: Pulmonary effort is normal. No respiratory distress.  Abdominal:     General: Abdomen is flat. There is no distension.  Musculoskeletal: Normal range of motion.  Skin:    General: Skin is warm and dry.     Findings: No erythema or rash.  Neurological:     General: No focal deficit present.     Mental Status: Alert and oriented to person, place, and time. Mental status is at baseline.     Motor: No weakness.  Psychiatric:        Mood and Affect: Mood normal.        Behavior: Behavior normal.    Assessment/Plan: The patient is scheduled for exchange of bilateral breast tissue expanders for bilateral breast implants with Dr. Ulice Bold.  Risks, benefits, and alternatives of procedure discussed, questions answered and consent obtained.    Smoking Status: Never  smoked Last Mammogram: Status post bilateral mastectomy  Caprini Score: 8; Risk Factors include: Age, history of cancer, currently on tamoxifen, BMI > 40, and length of planned surgery. Recommendation for mechanical and possible pharmacological prophylaxis. Encourage early ambulation.  Will discuss possible need for postoperative Lovenox with Dr. Ulice Bold.  Pictures obtained: Pictures previously taken  Post-op Rx sent to pharmacy: Oxycodone, Zofran, Keflex  Patient was provided with the General Surgical Risk consent document and Pain Medication Agreement prior to their appointment.  They had adequate time to read through the risk consent documents and Pain Medication Agreement. We also discussed them in person together during this preop appointment. All of their questions were answered to their satisfaction.  Recommended calling if they have any further questions.  Risk consent form and Pain Medication Agreement to be scanned into patient's chart.  Patient was provided with the Mentor implant patient decision checklist and this was completed during today's preoperative evaluation. Patient had time to read through the information and any questions were answered to their content. Form will be scanned into patient's chart.  The risks that can be encountered with and after placement of a breast implant were discussed and include the following but not limited to these: bleeding, infection, delayed healing, anesthesia risks, skin sensation changes, injury to structures including nerves, blood vessels, and muscles which may be temporary or permanent, allergies to tape, suture materials and glues, blood products, topical preparations or injected agents, skin contour irregularities, skin discoloration and swelling, deep vein thrombosis, cardiac and pulmonary complications, pain, which may persist, fluid accumulation, wrinkling of the skin over the implanmt, changes in nipple or breast sensation, implant  leakage or rupture, faulty position of the implant, persistent pain, formation of tight scar tissue around the implant (capsular contracture).  Patient is not taking ASA 81 mg at this time.  Patient is on tamoxifen, she has previously held this, discussed with patient recommendations for holding it prior to surgery and after surgery for 2 weeks.  She is unable to hold for 2 weeks prior to surgery due to surgery being scheduled recently.  Electronically signed by: Kermit Balo Wylan Gentzler, PA-C 05/31/2023 10:19 AM

## 2023-06-06 ENCOUNTER — Ambulatory Visit (HOSPITAL_BASED_OUTPATIENT_CLINIC_OR_DEPARTMENT_OTHER)
Admission: RE | Admit: 2023-06-06 | Discharge: 2023-06-06 | Disposition: A | Discharge status: Home / Self Care | Payer: Medicaid Other | Attending: Plastic Surgery | Admitting: Plastic Surgery

## 2023-06-06 ENCOUNTER — Encounter (HOSPITAL_BASED_OUTPATIENT_CLINIC_OR_DEPARTMENT_OTHER): Payer: Self-pay | Admitting: Plastic Surgery

## 2023-06-06 ENCOUNTER — Ambulatory Visit (HOSPITAL_BASED_OUTPATIENT_CLINIC_OR_DEPARTMENT_OTHER): Payer: Medicaid Other | Admitting: Anesthesiology

## 2023-06-06 ENCOUNTER — Encounter (HOSPITAL_BASED_OUTPATIENT_CLINIC_OR_DEPARTMENT_OTHER)
Admission: RE | Disposition: A | Discharge status: Home / Self Care | Payer: Self-pay | Source: Home / Self Care | Attending: Plastic Surgery

## 2023-06-06 ENCOUNTER — Other Ambulatory Visit: Payer: Self-pay

## 2023-06-06 DIAGNOSIS — Z8673 Personal history of transient ischemic attack (TIA), and cerebral infarction without residual deficits: Secondary | ICD-10-CM | POA: Insufficient documentation

## 2023-06-06 DIAGNOSIS — Z79810 Long term (current) use of selective estrogen receptor modulators (SERMs): Secondary | ICD-10-CM | POA: Diagnosis not present

## 2023-06-06 DIAGNOSIS — Z903 Acquired absence of stomach [part of]: Secondary | ICD-10-CM | POA: Diagnosis not present

## 2023-06-06 DIAGNOSIS — Z9049 Acquired absence of other specified parts of digestive tract: Secondary | ICD-10-CM | POA: Diagnosis not present

## 2023-06-06 DIAGNOSIS — Z803 Family history of malignant neoplasm of breast: Secondary | ICD-10-CM | POA: Insufficient documentation

## 2023-06-06 DIAGNOSIS — Z421 Encounter for breast reconstruction following mastectomy: Secondary | ICD-10-CM

## 2023-06-06 DIAGNOSIS — Z823 Family history of stroke: Secondary | ICD-10-CM | POA: Diagnosis not present

## 2023-06-06 DIAGNOSIS — Z5986 Financial insecurity: Secondary | ICD-10-CM | POA: Insufficient documentation

## 2023-06-06 DIAGNOSIS — Z96642 Presence of left artificial hip joint: Secondary | ICD-10-CM | POA: Diagnosis not present

## 2023-06-06 DIAGNOSIS — Z01818 Encounter for other preprocedural examination: Secondary | ICD-10-CM

## 2023-06-06 DIAGNOSIS — Z9013 Acquired absence of bilateral breasts and nipples: Secondary | ICD-10-CM

## 2023-06-06 DIAGNOSIS — G4730 Sleep apnea, unspecified: Secondary | ICD-10-CM | POA: Insufficient documentation

## 2023-06-06 DIAGNOSIS — Z853 Personal history of malignant neoplasm of breast: Secondary | ICD-10-CM | POA: Diagnosis not present

## 2023-06-06 HISTORY — PX: REMOVAL OF TISSUE EXPANDER AND PLACEMENT OF IMPLANT: SHX6457

## 2023-06-06 SURGERY — REMOVAL, TISSUE EXPANDER, BREAST, WITH IMPLANT INSERTION
Anesthesia: General | Site: Breast | Laterality: Bilateral

## 2023-06-06 MED ORDER — ACETAMINOPHEN 500 MG PO TABS: 1000.0000 mg | ORAL_TABLET | Freq: Once | ORAL | Status: DC

## 2023-06-06 MED ORDER — DEXAMETHASONE SODIUM PHOSPHATE 10 MG/ML IJ SOLN
INTRAMUSCULAR | Status: AC
  Filled 2023-06-06: qty 1

## 2023-06-06 MED ORDER — LIDOCAINE-EPINEPHRINE 1 %-1:100000 IJ SOLN
INTRAMUSCULAR | Status: DC | PRN
  Administered 2023-06-06: 5 mL

## 2023-06-06 MED ORDER — ROCURONIUM BROMIDE 100 MG/10ML IV SOLN
INTRAVENOUS | Status: DC | PRN
  Administered 2023-06-06: 50 mg via INTRAVENOUS

## 2023-06-06 MED ORDER — MIDAZOLAM HCL 2 MG/2ML IJ SOLN
INTRAMUSCULAR | Status: AC
  Filled 2023-06-06: qty 2

## 2023-06-06 MED ORDER — LIDOCAINE 2% (20 MG/ML) 5 ML SYRINGE
INTRAMUSCULAR | Status: AC
  Filled 2023-06-06: qty 5

## 2023-06-06 MED ORDER — SODIUM CHLORIDE 0.9 % IV SOLN: 250.0000 mL | INTRAVENOUS | Status: DC | PRN

## 2023-06-06 MED ORDER — ATROPINE SULFATE 0.4 MG/ML IV SOLN
INTRAVENOUS | Status: AC
  Filled 2023-06-06: qty 1

## 2023-06-06 MED ORDER — SCOPOLAMINE 1 MG/3DAYS TD PT72: 1.0000 | MEDICATED_PATCH | TRANSDERMAL | Status: DC

## 2023-06-06 MED ORDER — SUCCINYLCHOLINE CHLORIDE 200 MG/10ML IV SOSY
PREFILLED_SYRINGE | INTRAVENOUS | Status: AC
  Filled 2023-06-06: qty 10

## 2023-06-06 MED ORDER — ONDANSETRON HCL 4 MG/2ML IJ SOLN
INTRAMUSCULAR | Status: DC | PRN
  Administered 2023-06-06: 4 mg via INTRAVENOUS

## 2023-06-06 MED ORDER — LACTATED RINGERS IV SOLN: INTRAVENOUS | Status: DC

## 2023-06-06 MED ORDER — DEXAMETHASONE SODIUM PHOSPHATE 4 MG/ML IJ SOLN
INTRAMUSCULAR | Status: DC | PRN
  Administered 2023-06-06: 5 mg via INTRAVENOUS

## 2023-06-06 MED ORDER — ACETAMINOPHEN 325 MG RE SUPP: 650.0000 mg | RECTAL | Status: DC | PRN

## 2023-06-06 MED ORDER — DROPERIDOL 2.5 MG/ML IJ SOLN: 0.6250 mg | Freq: Once | INTRAMUSCULAR | Status: DC | PRN

## 2023-06-06 MED ORDER — SODIUM CHLORIDE 0.9% FLUSH: 3.0000 mL | Freq: Two times a day (BID) | INTRAVENOUS | Status: DC

## 2023-06-06 MED ORDER — BUPIVACAINE LIPOSOME 1.3 % IJ SUSP
INTRAMUSCULAR | Status: DC | PRN
  Administered 2023-06-06: 20 mL

## 2023-06-06 MED ORDER — ONDANSETRON HCL 4 MG/2ML IJ SOLN
INTRAMUSCULAR | Status: AC
  Filled 2023-06-06: qty 2

## 2023-06-06 MED ORDER — EPHEDRINE 5 MG/ML INJ
INTRAVENOUS | Status: AC
  Filled 2023-06-06: qty 5

## 2023-06-06 MED ORDER — FENTANYL CITRATE (PF) 100 MCG/2ML IJ SOLN
INTRAMUSCULAR | Status: AC
  Filled 2023-06-06: qty 2

## 2023-06-06 MED ORDER — ACETAMINOPHEN 10 MG/ML IV SOLN
1000.0000 mg | Freq: Once | INTRAVENOUS | Status: DC | PRN
  Administered 2023-06-06: 1000 mg via INTRAVENOUS

## 2023-06-06 MED ORDER — LIDOCAINE HCL (CARDIAC) PF 100 MG/5ML IV SOSY
PREFILLED_SYRINGE | INTRAVENOUS | Status: DC | PRN
  Administered 2023-06-06: 40 mg via INTRAVENOUS

## 2023-06-06 MED ORDER — VASHE WOUND IRRIGATION OPTIME
TOPICAL | Status: DC | PRN
  Administered 2023-06-06: 34 [oz_av]

## 2023-06-06 MED ORDER — ACETAMINOPHEN 160 MG/5ML PO SOLN: 325.0000 mg | ORAL | Status: DC | PRN

## 2023-06-06 MED ORDER — FENTANYL CITRATE (PF) 100 MCG/2ML IJ SOLN
25.0000 ug | INTRAMUSCULAR | Status: DC | PRN
  Administered 2023-06-06: 25 ug via INTRAVENOUS
  Administered 2023-06-06: 50 ug via INTRAVENOUS
  Administered 2023-06-06: 25 ug via INTRAVENOUS
  Administered 2023-06-06: 50 ug via INTRAVENOUS

## 2023-06-06 MED ORDER — CEFAZOLIN SODIUM-DEXTROSE 2-4 GM/100ML-% IV SOLN
INTRAVENOUS | Status: AC
  Filled 2023-06-06: qty 100

## 2023-06-06 MED ORDER — CHLORHEXIDINE GLUCONATE CLOTH 2 % EX PADS: 6.0000 | MEDICATED_PAD | Freq: Once | CUTANEOUS | Status: DC

## 2023-06-06 MED ORDER — SUGAMMADEX SODIUM 200 MG/2ML IV SOLN
INTRAVENOUS | Status: DC | PRN
Start: 2023-06-06 — End: 2023-06-06
  Administered 2023-06-06: 200 mg via INTRAVENOUS

## 2023-06-06 MED ORDER — MIDAZOLAM HCL 5 MG/5ML IJ SOLN
INTRAMUSCULAR | Status: DC | PRN
  Administered 2023-06-06: 2 mg via INTRAVENOUS

## 2023-06-06 MED ORDER — ACETAMINOPHEN 10 MG/ML IV SOLN
INTRAVENOUS | Status: AC
  Filled 2023-06-06: qty 100

## 2023-06-06 MED ORDER — PHENYLEPHRINE 80 MCG/ML (10ML) SYRINGE FOR IV PUSH (FOR BLOOD PRESSURE SUPPORT)
PREFILLED_SYRINGE | INTRAVENOUS | Status: AC
  Filled 2023-06-06: qty 10

## 2023-06-06 MED ORDER — CEFAZOLIN SODIUM-DEXTROSE 2-4 GM/100ML-% IV SOLN
2.0000 g | INTRAVENOUS | Status: AC
  Administered 2023-06-06: 2 g via INTRAVENOUS

## 2023-06-06 MED ORDER — ROCURONIUM BROMIDE 10 MG/ML (PF) SYRINGE
PREFILLED_SYRINGE | INTRAVENOUS | Status: AC
  Filled 2023-06-06: qty 10

## 2023-06-06 MED ORDER — SODIUM CHLORIDE 0.9% FLUSH: 3.0000 mL | INTRAVENOUS | Status: DC | PRN

## 2023-06-06 MED ORDER — ACETAMINOPHEN 325 MG PO TABS: 325.0000 mg | ORAL_TABLET | ORAL | Status: DC | PRN

## 2023-06-06 MED ORDER — ACETAMINOPHEN 325 MG PO TABS: 650.0000 mg | ORAL_TABLET | ORAL | Status: DC | PRN

## 2023-06-06 MED ORDER — PROPOFOL 10 MG/ML IV BOLUS
INTRAVENOUS | Status: DC | PRN
  Administered 2023-06-06: 140 mg via INTRAVENOUS

## 2023-06-06 MED ORDER — FENTANYL CITRATE (PF) 100 MCG/2ML IJ SOLN
INTRAMUSCULAR | Status: DC | PRN
  Administered 2023-06-06: 100 ug via INTRAVENOUS

## 2023-06-06 MED ORDER — BUPIVACAINE LIPOSOME 1.3 % IJ SUSP
INTRAMUSCULAR | Status: AC
  Filled 2023-06-06: qty 20

## 2023-06-06 SURGICAL SUPPLY — 67 items
ADH SKN CLS APL DERMABOND .7 (GAUZE/BANDAGES/DRESSINGS) ×2
BAG DECANTER FOR FLEXI CONT (MISCELLANEOUS) ×1 IMPLANT
BINDER BREAST LRG (GAUZE/BANDAGES/DRESSINGS) IMPLANT
BINDER BREAST MEDIUM (GAUZE/BANDAGES/DRESSINGS) IMPLANT
BINDER BREAST XLRG (GAUZE/BANDAGES/DRESSINGS) IMPLANT
BINDER BREAST XXLRG (GAUZE/BANDAGES/DRESSINGS) IMPLANT
BIOPATCH RED 1 DISK 7.0 (GAUZE/BANDAGES/DRESSINGS) IMPLANT
BLADE SURG 15 STRL LF DISP TIS (BLADE) ×1 IMPLANT
BLADE SURG 15 STRL SS (BLADE) ×1
CANISTER SUCT 1200ML W/VALVE (MISCELLANEOUS) ×1 IMPLANT
COVER BACK TABLE 60X90IN (DRAPES) ×1 IMPLANT
COVER MAYO STAND STRL (DRAPES) ×1 IMPLANT
DERMABOND ADVANCED .7 DNX12 (GAUZE/BANDAGES/DRESSINGS) IMPLANT
DRAIN CHANNEL 19F RND (DRAIN) IMPLANT
DRAPE LAPAROSCOPIC ABDOMINAL (DRAPES) ×1 IMPLANT
DRESSING MEPILEX FLEX 4X4 (GAUZE/BANDAGES/DRESSINGS) IMPLANT
DRSG MEPILEX FLEX 4X4 (GAUZE/BANDAGES/DRESSINGS) ×2
DRSG MEPILEX POST OP 4X8 (GAUZE/BANDAGES/DRESSINGS) ×2 IMPLANT
ELECT BLADE 4.0 EZ CLEAN MEGAD (MISCELLANEOUS) ×1
ELECT COATED BLADE 2.86 ST (ELECTRODE) ×1 IMPLANT
ELECT REM PT RETURN 9FT ADLT (ELECTROSURGICAL) ×1
ELECTRODE BLDE 4.0 EZ CLN MEGD (MISCELLANEOUS) ×1 IMPLANT
ELECTRODE REM PT RTRN 9FT ADLT (ELECTROSURGICAL) ×1 IMPLANT
EVACUATOR SILICONE 100CC (DRAIN) IMPLANT
FUNNEL KELLER 2 DISP (MISCELLANEOUS) IMPLANT
GAUZE PAD ABD 8X10 STRL (GAUZE/BANDAGES/DRESSINGS) ×2 IMPLANT
GAUZE SPONGE 4X4 12PLY STRL LF (GAUZE/BANDAGES/DRESSINGS) IMPLANT
GLOVE BIO SURGEON STRL SZ 6.5 (GLOVE) ×2 IMPLANT
GLOVE BIO SURGEON STRL SZ7.5 (GLOVE) ×1 IMPLANT
GOWN STRL REUS W/ TWL LRG LVL3 (GOWN DISPOSABLE) ×3 IMPLANT
GOWN STRL REUS W/TWL LRG LVL3 (GOWN DISPOSABLE) ×3
IMPL BREAST P6.5XULT HI 650 (Breast) IMPLANT
IMPL BRST P6.5XULT HI 650CC (Breast) ×2 IMPLANT
IMPLANT BREAST GEL 650CC (Breast) ×2 IMPLANT
IV NS 1000ML (IV SOLUTION)
IV NS 1000ML BAXH (IV SOLUTION) IMPLANT
NDL HYPO 25X1 1.5 SAFETY (NEEDLE) ×1 IMPLANT
NDL SAFETY ECLIP 18X1.5 (MISCELLANEOUS) ×1 IMPLANT
NEEDLE HYPO 25X1 1.5 SAFETY (NEEDLE) ×1
PACK BASIN DAY SURGERY FS (CUSTOM PROCEDURE TRAY) ×1 IMPLANT
PENCIL SMOKE EVACUATOR (MISCELLANEOUS) ×1 IMPLANT
PIN SAFETY STERILE (MISCELLANEOUS) IMPLANT
SIZER BREAST REUSE 650CC (SIZER) ×1
SIZER BRST REUSE P6.4 650CC (SIZER) IMPLANT
SLEEVE SCD COMPRESS KNEE MED (STOCKING) ×1 IMPLANT
SPIKE FLUID TRANSFER (MISCELLANEOUS) IMPLANT
SPONGE T-LAP 18X18 ~~LOC~~+RFID (SPONGE) ×2 IMPLANT
STRIP SUTURE WOUND CLOSURE 1/2 (MISCELLANEOUS) IMPLANT
SUT MNCRL AB 3-0 PS2 18 (SUTURE) IMPLANT
SUT MNCRL AB 4-0 PS2 18 (SUTURE) IMPLANT
SUT MON AB 3-0 SH 27 (SUTURE)
SUT MON AB 3-0 SH27 (SUTURE) IMPLANT
SUT MON AB 5-0 PS2 18 (SUTURE) IMPLANT
SUT PDS 3-0 CT2 (SUTURE) ×5
SUT PDS AB 2-0 CT2 27 (SUTURE) IMPLANT
SUT PDS II 3-0 CT2 27 ABS (SUTURE) IMPLANT
SUT SILK 3 0 PS 1 (SUTURE) IMPLANT
SUT VIC AB 3-0 SH 27 (SUTURE) ×2
SUT VIC AB 3-0 SH 27X BRD (SUTURE) IMPLANT
SUT VIC AB 4-0 PS2 18 (SUTURE) IMPLANT
SYR BULB IRRIG 60ML STRL (SYRINGE) ×1 IMPLANT
SYR CONTROL 10ML LL (SYRINGE) ×1 IMPLANT
TOWEL GREEN STERILE FF (TOWEL DISPOSABLE) ×2 IMPLANT
TRAY DSU PREP LF (CUSTOM PROCEDURE TRAY) ×1 IMPLANT
TUBE CONNECTING 20X1/4 (TUBING) ×1 IMPLANT
UNDERPAD 30X36 HEAVY ABSORB (UNDERPADS AND DIAPERS) ×2 IMPLANT
YANKAUER SUCT BULB TIP NO VENT (SUCTIONS) ×1 IMPLANT

## 2023-06-06 NOTE — Transfer of Care (Signed)
Immediate Anesthesia Transfer of Care Note  Patient: Vanessa Medina  Procedure(s) Performed: REMOVAL OF TISSUE EXPANDER AND PLACEMENT OF IMPLANT (Bilateral: Breast)  Patient Location: PACU  Anesthesia Type:General  Level of Consciousness: awake, alert , oriented, drowsy, and patient cooperative  Airway & Oxygen Therapy: Patient Spontanous Breathing and Patient connected to face mask oxygen  Post-op Assessment: Report given to RN and Post -op Vital signs reviewed and stable  Post vital signs: Reviewed and stable  Last Vitals:  Vitals Value Taken Time  BP    Temp    Pulse    Resp    SpO2      Last Pain:  Vitals:   06/06/23 0846  TempSrc:   PainSc: 0-No pain         Complications: No notable events documented.

## 2023-06-06 NOTE — Discharge Instructions (Addendum)
INSTRUCTIONS FOR AFTER BREAST SURGERY   You will likely have some questions about what to expect following your operation.  The following information will help you and your family understand what to expect when you are discharged from the hospital.  It is important to follow these guidelines to help ensure a smooth recovery and reduce complication.  Postoperative instructions include information on: diet, wound care, medications and physical activity.  AFTER SURGERY Expect to go home after the procedure.  In some cases, you may need to spend one night in the hospital for observation.  DIET Breast surgery does not require a specific diet.  However, the healthier you eat the better your body will heal. It is important to increasing your protein intake.  This means limiting the foods with sugar and carbohydrates.  Focus on vegetables and some meat.  If you have liposuction during your procedure be sure to drink water.  If your urine is bright yellow, then it is concentrated, and you need to drink more water.  As a general rule after surgery, you should have 8 ounces of water every hour while awake.  If you find you are persistently nauseated or unable to take in liquids let us know.  NO TOBACCO USE or EXPOSURE.  This will slow your healing process and lead to a wound.  WOUND CARE Leave the binder on for 3 days . Use fragrance free soap like Dial, Dove or [place].   After 3 days you can remove the binder to shower. Once dry apply binder or sports bra. If you have liposuction you will have a soft and spongy dressing (Lipofoam) that helps prevent creases in your skin.  Remove before you shower and then replace it.  It is also available on [employer]. If you have steri-strips / tape directly attached to your skin leave them in place. It is OK to get these wet.   No baths, pools or hot tubs for four weeks. We close your incision to leave the smallest and best-looking scar. No ointment or creams on your incisions  for four weeks.  No Neosporin (Too many skin reactions).  A few weeks after surgery you can use Mederma and start massaging the scar. We ask you to wear your binder or sports bra for the first 6 weeks around the clock, including while sleeping. This provides added comfort and helps reduce the fluid accumulation at the surgery site. NO Ice or heating pads to the operative site.  You have a very high risk of a BURN before you feel the temperature change.  ACTIVITY No heavy lifting until cleared by the doctor.  This usually means no more than a half-gallon of milk.  It is OK to walk and climb stairs. Moving your legs is very important to decrease your risk of a blood clot.  It will also help keep you from getting deconditioned.  Every 1 to 2 hours get up and walk for 5 minutes. This will help with a quicker recovery back to normal.  Let pain be your guide so you don't do too much.  This time is for you to recover.  You will be more comfortable if you sleep and rest with your head elevated either with a few pillows under you or in a recliner.  No stomach sleeping for a three months.  WORK Everyone returns to work at different times. As a rough guide, most people take at least 1 - 2 weeks off prior to returning to work. If  you need documentation for your job, give the forms to the front staff at the clinic.  DRIVING Arrange for someone to bring you home from the hospital after your surgery.  You may be able to drive a few days after surgery but not while taking any narcotics or valium.  BOWEL MOVEMENTS Constipation can occur after anesthesia and while taking pain medication.  It is important to stay ahead for your comfort.  We recommend taking Milk of Magnesia (2 tablespoons; twice a day) while taking the pain pills.  MEDICATIONS You may be prescribed should start after surgery At your preoperative visit for you history and physical you may have been given the following medications: An antibiotic: Start  this medication when you get home and take according to the instructions on the bottle. Zofran 4 mg:  This is to treat nausea and vomiting.  You can take this every 6 hours as needed and only if needed. Valium 2 mg for breast cancer patients: This is for muscle tightness if you have an implant or expander. This will help relax your muscle which also helps with pain control.  This can be taken every 12 hours as needed. Don't drive after taking this medication. Norco (hydrocodone/acetaminophen) 5/325 mg:  This is only to be used after you have taken the Motrin or the Tylenol. Every 8 hours as needed.   Over the counter Medication to take: Ibuprofen (Motrin) 600 mg:  Take this every 6 hours.  If you have additional pain then take 500 mg of the Tylenol every 8 hours.  Only take the Norco after you have tried these two. MiraLAX or Milk of Magnesia: Take this according to the bottle if you take the Norco.  WHEN TO CALL Call your surgeon's office if any of the following occur: Fever 101 degrees F or greater Excessive bleeding or fluid from the incision site. Pain that increases over time without aid from the medications Redness, warmth, or pus draining from incision sites Persistent nausea or inability to take in liquids Severe misshapen area that underwent the operation.  You may have Tylenol again after 7pm tonight, if needed.   Post Anesthesia Home Care Instructions  Activity: Get plenty of rest for the remainder of the day. A responsible individual must stay with you for 24 hours following the procedure.  For the next 24 hours, DO NOT: -Drive a car -Advertising copywriter -Drink alcoholic beverages -Take any medication unless instructed by your physician -Make any legal decisions or sign important papers.  Meals: Start with liquid foods such as gelatin or soup. Progress to regular foods as tolerated. Avoid greasy, spicy, heavy foods. If nausea and/or vomiting occur, drink only clear liquids  until the nausea and/or vomiting subsides. Call your physician if vomiting continues.  Special Instructions/Symptoms: Your throat may feel dry or sore from the anesthesia or the breathing tube placed in your throat during surgery. If this causes discomfort, gargle with warm salt water. The discomfort should disappear within 24 hours.  If you had a scopolamine patch placed behind your ear for the management of post- operative nausea and/or vomiting:  1. The medication in the patch is effective for 72 hours, after which it should be removed.  Wrap patch in a tissue and discard in the trash. Wash hands thoroughly with soap and water. 2. You may remove the patch earlier than 72 hours if you experience unpleasant side effects which may include dry mouth, dizziness or visual disturbances. 3. Avoid touching the patch.  Wash your hands with soap and water after contact with the patch.   Information for Discharge Teaching: EXPAREL (bupivacaine liposome injectable suspension)   Pain relief is important to your recovery. The goal is to control your pain so you can move easier and return to your normal activities as soon as possible after your procedure. Your physician may use several types of medicines to manage pain, swelling, and more.  Your surgeon or anesthesiologist gave you EXPAREL(bupivacaine) to help control your pain after surgery.  EXPAREL is a local anesthetic designed to release slowly over an extended period of time to provide pain relief by numbing the tissue around the surgical site. EXPAREL is designed to release pain medication over time and can control pain for up to 72 hours. Depending on how you respond to EXPAREL, you may require less pain medication during your recovery. EXPAREL can help reduce or eliminate the need for opioids during the first few days after surgery when pain relief is needed the most. EXPAREL is not an opioid and is not addictive. It does not cause sleepiness or  sedation.   Important! A teal colored band has been placed on your arm with the date, time and amount of EXPAREL you have received. Please leave this armband in place for the full 96 hours following administration, and then you may remove the band. If you return to the hospital for any reason within 96 hours following the administration of EXPAREL, the armband provides important information that your health care providers to know, and alerts them that you have received this anesthetic.    Possible side effects of EXPAREL: Temporary loss of sensation or ability to move in the area where medication was injected. Nausea, vomiting, constipation Rarely, numbness and tingling in your mouth or lips, lightheadedness, or anxiety may occur. Call your doctor right away if you think you may be experiencing any of these sensations, or if you have other questions regarding possible side effects.  Follow all other discharge instructions given to you by your surgeon or nurse. Eat a healthy diet and drink plenty of water or other fluids.

## 2023-06-06 NOTE — Anesthesia Procedure Notes (Signed)
Procedure Name: Intubation Date/Time: 06/06/2023 10:43 AM  Performed by: Ronnette Hila, CRNAPre-anesthesia Checklist: Patient identified, Emergency Drugs available, Suction available and Patient being monitored Patient Re-evaluated:Patient Re-evaluated prior to induction Oxygen Delivery Method: Circle system utilized Preoxygenation: Pre-oxygenation with 100% oxygen Induction Type: IV induction Ventilation: Mask ventilation without difficulty Laryngoscope Size: Mac and 3 Grade View: Grade I Tube type: Oral Tube size: 7.0 mm Number of attempts: 1 Airway Equipment and Method: Stylet and Oral airway Placement Confirmation: ETT inserted through vocal cords under direct vision, positive ETCO2 and breath sounds checked- equal and bilateral Secured at: 21 cm Tube secured with: Tape Dental Injury: Teeth and Oropharynx as per pre-operative assessment

## 2023-06-06 NOTE — Anesthesia Preprocedure Evaluation (Addendum)
Anesthesia Evaluation  Patient identified by MRN, date of birth, ID band Patient awake    Reviewed: Allergy & Precautions, NPO status , Patient's Chart, lab work & pertinent test results  History of Anesthesia Complications (+) PONV and history of anesthetic complications  Airway Mallampati: II  TM Distance: >3 FB Neck ROM: Full    Dental  (+) Teeth Intact, Dental Advisory Given   Pulmonary asthma , sleep apnea    breath sounds clear to auscultation       Cardiovascular negative cardio ROS  Rhythm:Regular Rate:Normal     Neuro/Psych  Headaches PSYCHIATRIC DISORDERS Anxiety Depression     Neuromuscular disease CVA    GI/Hepatic Neg liver ROS,GERD  ,,  Endo/Other  negative endocrine ROS    Renal/GU Renal disease     Musculoskeletal  (+) Arthritis ,    Abdominal   Peds  Hematology  (+) Blood dyscrasia, anemia   Anesthesia Other Findings   Reproductive/Obstetrics                             Anesthesia Physical Anesthesia Plan  ASA: 3  Anesthesia Plan: General   Post-op Pain Management: Tylenol PO (pre-op)* and Toradol IV (intra-op)*   Induction: Intravenous  PONV Risk Score and Plan: 4 or greater and Ondansetron, Dexamethasone, Midazolam and Scopolamine patch - Pre-op  Airway Management Planned: Oral ETT  Additional Equipment: None  Intra-op Plan:   Post-operative Plan: Extubation in OR  Informed Consent: I have reviewed the patients History and Physical, chart, labs and discussed the procedure including the risks, benefits and alternatives for the proposed anesthesia with the patient or authorized representative who has indicated his/her understanding and acceptance.     Dental advisory given  Plan Discussed with: CRNA  Anesthesia Plan Comments:        Anesthesia Quick Evaluation

## 2023-06-06 NOTE — Anesthesia Postprocedure Evaluation (Signed)
Anesthesia Post Note  Patient: KHARA RENAUD  Procedure(s) Performed: REMOVAL OF TISSUE EXPANDER AND PLACEMENT OF IMPLANT (Bilateral: Breast)     Patient location during evaluation: PACU Anesthesia Type: General Level of consciousness: awake and alert Pain management: pain level controlled Vital Signs Assessment: post-procedure vital signs reviewed and stable Respiratory status: spontaneous breathing, nonlabored ventilation, respiratory function stable and patient connected to nasal cannula oxygen Cardiovascular status: blood pressure returned to baseline and stable Postop Assessment: no apparent nausea or vomiting Anesthetic complications: no  No notable events documented.  Last Vitals:  Vitals:   06/06/23 1315 06/06/23 1337  BP: 134/77 128/73  Pulse: 66 74  Resp: 14 18  Temp:  (!) 36.1 C  SpO2: 95% 98%    Last Pain:  Vitals:   06/06/23 1337  TempSrc: Oral  PainSc: 0-No pain                 Shelton Silvas

## 2023-06-06 NOTE — Interval H&P Note (Signed)
History and Physical Interval Note:  06/06/2023 9:56 AM  Vanessa Medina  has presented today for surgery, with the diagnosis of hx of breast cancer.  The various methods of treatment have been discussed with the patient and family. After consideration of risks, benefits and other options for treatment, the patient has consented to  Procedure(s): REMOVAL OF TISSUE EXPANDER AND PLACEMENT OF IMPLANT (Bilateral) as a surgical intervention.  The patient's history has been reviewed, patient examined, no change in status, stable for surgery.  I have reviewed the patient's chart and labs.  Questions were answered to the patient's satisfaction.     Alena Bills Batool Majid

## 2023-06-06 NOTE — Op Note (Signed)
Op report Bilateral Exchange   DATE OF OPERATION: 06/06/2023  LOCATION: [facility]  SURGICAL DIVISION: Plastic Surgery  PREOPERATIVE DIAGNOSIS:  1.History of left breast cancer.  2. Acquired absence of bilateral breast.   POSTOPERATIVE DIAGNOSIS:  1. History of left breast cancer.  2. Acquired absence of bilateral breast.   PROCEDURE:  1. Bilateral exchange of tissue expanders for implants.  2. Bilateral capsulotomies for implant respositioning. 3. Right breast capsulectomy with major repositioning of 3 x 8 cm of capsule.  SURGEON: Foster Simpson, DO  ASSISTANT: Caroline More, PA  ANESTHESIA:  General.   COMPLICATIONS: None.   IMPLANTS: Left - Mentor Smooth Round Ultra High Profile Gel 650 cc Right - Mentor Smooth Round Ultra High Profile Gel 650 cc.   INDICATIONS FOR PROCEDURE:  The patient, Vanessa Medina, is a 52 y.o. female born on 1971/06/11, is here for treatment after bilateral mastectomies.  She had tissue expanders placed at the time of mastectomies. She now presents for exchange of her expanders for implants.  She requires capsulotomies to better position the implants. MRN: [id number]  CONSENT:  Informed consent was obtained directly from the patient. Risks, benefits and alternatives were fully discussed. Specific risks including but not limited to bleeding, infection, hematoma, seroma, scarring, pain, implant infection, implant extrusion, capsular contracture, asymmetry, wound healing problems, and need for further surgery were all discussed. The patient did have an ample opportunity to have her questions answered to her satisfaction.   DESCRIPTION OF PROCEDURE:  The patient was taken to the operating room. SCDs were placed and IV antibiotics were given. The patient's chest was prepped and draped in a sterile fashion. A time out was performed and the implants to be used were identified.    On the right breast: Local with epinephrine  was used to infiltrate at the incision site. The old mastectomy scar was excised.  The mastectomy flaps from the superior and inferior flaps were raised over the pectoralis major muscle for several centimeters to minimize tension for the closure. The pectoralis was split inferior to the skin incision to expose and remove the tissue expander.  Inspection of the pocket showed a normal healthy capsule and good integration of the biologic matrix.  The pocket was irrigated with antibiotic solution. Experel was placed for postoperative pain control. Circumferential capsulotomies were performed to allow for breast pocket expansion.  Measurements were made and a sizer used to confirm adequate pocket size for the implant dimensions.  Hemostasis was ensured with electrocautery. New gloves were placed. The implant was soaked in antibiotic solution and then placed in the pocket and oriented appropriately. The pectoralis major muscle and capsule on the anterior surface were re-closed with a 3-0 PDS suture. The deep layer was closed with 3-0 PDS.  The skin was closed with 4-0 Monocryl.    On the left breast: The old mastectomy scar was excised.  The mastectomy flaps from the superior and inferior flaps were raised over the pectoralis major muscle for several centimeters to minimize tension for the closure. The pectoralis was split inferior to the skin incision to expose and remove the tissue expander.  Inspection of the pocket showed a normal healthy capsule and good integration of the biologic matrix.  Experel was placed for postoperative pain control. Circumferential capsulotomies were performed to allow for breast pocket expansion.  The lateral capsule was excised for an area of 3 x 8 cm.  This was done to reposition the capsule more medially and make  the pocket smaller.  The tissue was brought medially and suture with 3-0 PDS.  The ADM was not incorporated into the flap but was nicely healed to the pectoralis muscle and the  chest wall. Measurements were made and a sizer utilized to confirm adequate pocket size for the implant dimensions.  Hemostasis was ensured with the electrocautery.  New gloves were applied. The implant was soaked in antibiotic solution and placed in the pocket and oriented appropriately. The pectoralis major muscle and capsule on the anterior surface were re-closed with a 3-0 PDS suture. The deep layer was closed with 3-0 PDS.  The skin was closed with 4-0 Monocryl.  Dermabond was applied to the incision site. A breast binder and ABDs were placed.  The patient was allowed to wake from anesthesia and taken to the recovery room in satisfactory condition.   The advanced practice practitioner (APP) assisted throughout the case.  The APP was essential in retraction and counter traction when needed to make the case progress smoothly.  This retraction and assistance made it possible to see the tissue plans for the procedure.  The assistance was needed for blood control, tissue re-approximation and assisted with closure of the incision site.

## 2023-06-07 ENCOUNTER — Encounter (HOSPITAL_BASED_OUTPATIENT_CLINIC_OR_DEPARTMENT_OTHER): Payer: Self-pay | Admitting: Plastic Surgery

## 2023-06-08 ENCOUNTER — Ambulatory Visit (INDEPENDENT_AMBULATORY_CARE_PROVIDER_SITE_OTHER): Payer: Medicaid Other | Admitting: Student

## 2023-06-08 ENCOUNTER — Encounter: Payer: Self-pay | Admitting: Student

## 2023-06-08 VITALS — BP 133/80 | HR 69 | Ht 64.0 in | Wt 250.0 lb

## 2023-06-08 DIAGNOSIS — Z9889 Other specified postprocedural states: Secondary | ICD-10-CM

## 2023-06-08 NOTE — Progress Notes (Signed)
Patient is a 52 year old female who underwent bilateral exchange of tissue expanders for implants, bilateral capsulotomies for implant repositioning and right breast capsulectomy with major repositioning of 3 x 8 cm of capsule on 06/06/2023 Dr. Ulice Bold.  Patient had Mentor smooth round ultrahigh profile gel implants 650 cc placed bilaterally.  Patient presents to the clinic today for pain.  Today, patient states she is doing okay.  She states that she is having pain to her lateral left breast and does not feel the medications are helping.  She denies any fevers or chills.  She denies any nausea or vomiting.  She reports she is eating and drinking without issue and has been ambulating without issue.  She is concerned that there might be some fluid to the left breast.  Chaperone present on exam. On exam, patient is sitting upright in no acute distress.  Right breast is soft, implant in place.  There is no overlying erythema.  No fluid collections on exam.  Mepilex border dressings clean dry and intact.  This was removed.  Incision is intact with Dermabond.  No wounds or signs of infection on exam.  Left breast is soft, implant is in place.  There does appear to be a little bit of fluid out laterally.  No overlying erythema.  Mepilex border dressing is clean and dry.  This was removed.  Incision is intact with Dermabond.  No signs of infection on exam.  There is also a small red lesion noted to the left breast.  Dr. Ulice Bold also had the opportunity to examine the patient.  She attempted aspiration to the left fluid collection out laterally using a sterile technique, but no fluid was able to be aspirated.  Discussed with patient that we will order an ultrasound aspiration of left fluid collection given that she has an implant in place.  Patient expressed understanding.  Discussed with patient to continue compression at all times.  Discussed with her that she should continue to take Tylenol and the  hydrocodone for pain.  Patient cannot take NSAIDs.  Discussed with her that she should continue to monitor her pain and call us if it worsens.  Patient expressed understanding.  Also discussed with patient that we will have the small lesion to the left breast excised at some point later on after she is healed a little bit.  Patient expressed understanding.  Patient to follow back up next week.  I instructed the patient to call in the meantime she has any questions or concerns about anything.

## 2023-06-11 ENCOUNTER — Other Ambulatory Visit: Payer: Self-pay | Admitting: Student

## 2023-06-11 ENCOUNTER — Ambulatory Visit (INDEPENDENT_AMBULATORY_CARE_PROVIDER_SITE_OTHER): Payer: Medicaid Other | Admitting: Surgery

## 2023-06-11 ENCOUNTER — Encounter: Payer: Self-pay | Admitting: Surgery

## 2023-06-11 VITALS — BP 121/73 | HR 75 | Temp 98.0°F | Ht 64.0 in | Wt 251.0 lb

## 2023-06-11 DIAGNOSIS — C50412 Malignant neoplasm of upper-outer quadrant of left female breast: Secondary | ICD-10-CM

## 2023-06-11 DIAGNOSIS — Z853 Personal history of malignant neoplasm of breast: Secondary | ICD-10-CM | POA: Diagnosis not present

## 2023-06-11 DIAGNOSIS — L989 Disorder of the skin and subcutaneous tissue, unspecified: Secondary | ICD-10-CM

## 2023-06-11 DIAGNOSIS — G8929 Other chronic pain: Secondary | ICD-10-CM | POA: Diagnosis not present

## 2023-06-11 DIAGNOSIS — Z9889 Other specified postprocedural states: Secondary | ICD-10-CM

## 2023-06-11 NOTE — Patient Instructions (Addendum)
You can consult with Dr Ulice Bold about the area on your chest if you want.   This is something we can look into later after you have fully healed.    Follow up here in 6 months for an exam.

## 2023-06-12 ENCOUNTER — Telehealth: Payer: Self-pay | Admitting: Student

## 2023-06-12 ENCOUNTER — Ambulatory Visit
Admission: RE | Admit: 2023-06-12 | Discharge: 2023-06-12 | Disposition: A | Discharge status: Home / Self Care | Payer: Medicaid Other | Source: Ambulatory Visit | Attending: Student | Admitting: Student

## 2023-06-12 DIAGNOSIS — Z9889 Other specified postprocedural states: Secondary | ICD-10-CM | POA: Diagnosis present

## 2023-06-12 NOTE — Telephone Encounter (Signed)
I called the patient and discussed the results of her ultrasound with her.  Discussed with her that there was not enough fluid to aspirate and discussed with her the importance of wearing compression at all times.  Patient expressed understanding.  Patient states she is doing a little bit better, but is still little bit sore on the sides.  I discussed with the patient to continue with Tylenol for pain control and oxycodone if needed.  Patient expressed understanding.  Patient to follow back up next week.  Discussed with her to call in the meantime she has any questions or concerns about anything.

## 2023-06-13 NOTE — Progress Notes (Signed)
Outpatient Surgical Follow Up  Vanessa Medina is an 52 y.o. female.   Chief Complaint  Patient presents with   Follow-up    HPI: Vanessa Medina does have a history of invasive left breast cancer status post bilateral mastectomies with sentinel lymph node biopsy on 5/16, she did have immediate reconstruction and last week had definitive implants placed.  She had some issues with seroma on the left side.  She does have chronic pain and please note that this was baseline prior to any mastectomies.  She endorses a lesion within the skin on her left chest wall/breast upper inner quadrant.  Past Medical History:  Diagnosis Date   Anemia    Anxiety    Breast cancer (HCC) 01/18/2023   left breast   Cervical cancer (HCC)    Chronic kidney disease    Previous now clear   Claustrophobia    Depression    Family history of adverse reaction to anesthesia     " my mother takes a long time time wake up"   History of dysplastic nevus 01/18/2018   right shoulder, recurrent dysplastic nevus   History of kidney stones    History of shingles    Migraine    Multiple allergies    Obesity    Osteoarthritis    left hip   Pneumonia    PONV (postoperative nausea and vomiting)    slow to wake up   Sleep apnea    does not wear CPAP   Slow to wake up after anesthesia    Stroke (HCC) 08/11/2020   spinal cord stroke, spinal cord puncture during surgery   Wears glasses     Past Surgical History:  Procedure Laterality Date   ABDOMINAL HYSTERECTOMY     APPENDECTOMY     BACK SURGERY     BILIOPANCREATIC DIVISION W DUODENAL SWITCH N/A    BREAST BIOPSY Left 12/06/2022   u/s bx,1:00 heart clip, path pending   BREAST BIOPSY Left 12/06/2022   [name] bx 2:00 ribbon clip path pending   BREAST BIOPSY Left 12/06/2022   [name] LT BREAST BX W LOC DEV EA ADD LESION IMG BX SPEC US GUIDE 12/06/2022 [facility]   BREAST BIOPSY Left 12/06/2022   [name] LT BREAST BX W LOC DEV 1ST LESION IMG BX SPEC US GUIDE 12/06/2022  [facility]   BREAST RECONSTRUCTION WITH PLACEMENT OF TISSUE EXPANDER AND FLEX HD (ACELLULAR HYDRATED DERMIS) Bilateral 01/18/2023   Procedure: IMMEDIATE LEFT BREAST RECONSTRUCTION WITH PLACEMENT OF TISSUE EXPANDER AND FLEX HD (ACELLULAR HYDRATED DERMIS);  Surgeon: Peggye Form, DO;  Location: [facility];  Service: Plastics;  Laterality: Bilateral;   BREAST RECONSTRUCTION WITH PLACEMENT OF TISSUE EXPANDER AND FLEX HD (ACELLULAR HYDRATED DERMIS) Left 03/26/2023   Procedure: BREAST RECONSTRUCTION WITH PLACEMENT OF TISSUE EXPANDER;  Surgeon: Peggye Form, DO;  Location:  SUR[facility]ERY CENTER;  Service: Plastics;  Laterality: Left;   CHOLECYSTECTOMY     COLONOSCOPY N/A 06/27/2021   Procedure: COLONOSCOPY;  Surgeon: Jaynie Collins, DO;  Location: [facility] END[facility]COPY;  Service: Gastroenterology;  Laterality: N/A;   DIAGNOSTIC LAPAROSCOPY     LOA   ESOPHAGOGASTRODUODENOSCOPY N/A 06/27/2021   Procedure: ESOPHAGOGASTRODUODENOSCOPY (EGD);  Surgeon: Jaynie Collins, DO;  Location: [facility] ENDOSCOPY;  Service: Gastroenterology;  Laterality: N/A;   ESOPHAGOGASTRODUODENOSCOPY (EGD) WITH PROPOFOL N/A 07/25/2017   Procedure: ESOPHAGOGASTRODUODENOSCOPY (EGD) WITH PROPOFOL;  Surgeon: Scot Jun, MD;  Location: [facility] ENDOSCOPY;  Service: Endoscopy;  Laterality: N/A;   KNEE ARTHROSCOPY     LAPAROSCOPIC  GASTRIC SLEEVE RESECTION WITH HIATAL HERNIA REPAIR     LUMBAR FUSION  08/29/2022   Revision Lumbar two through five Laminectomy & Fusion with Transforaminal Lumbar interbody fusion   MASTECTOMY W/ SENTINEL NODE BIOPSY Bilateral 01/18/2023   Procedure: MASTECTOMY WITH SENTINEL LYMPH NODE BIOPSY, RNFA to assist;  Surgeon: Leafy Ro, MD;  Location: [facility];  Service: General;  Laterality: Bilateral;   REMOVAL OF TISSUE EXPANDER AND PLACEMENT OF IMPLANT Bilateral 06/06/2023   Procedure: REMOVAL OF TISSUE EXPANDER AND PLACEMENT OF IMPLANT;  Surgeon: Peggye Form, DO;  Location:  [facility];  Service: Plastics;  Laterality: Bilateral;   TONSILLECTOMY     TOTAL HIP ARTHROPLASTY Left 01/26/2016   Procedure: LEFT TOTAL HIP ARTHROPLASTY ANTERIOR APPROACH;  Surgeon: Tarry Kos, MD;  Location: [facility];  Service: Orthopedics;  Laterality: Left;   TRANSFORAMINAL LUMBAR INTERBODY FUSION (TLIF) WITH PEDICLE SCREW FIXATION 3 LEVEL  08/2019   Vira Browns, MD L2-L5    Family History  Problem Relation Age of Onset   Renal Disease Mother    Hypertension Mother    Sudden Cardiac Death Mother    Heart failure Mother    Valvular heart disease Mother    Heart disease Mother    Breast cancer Maternal Grandmother    Stroke Brother    Heart attack Brother 45   Diabetes Other     Social History:  reports that she has never smoked. She has never been exposed to tobacco smoke. She has never used smokeless tobacco. She reports current alcohol use. She reports that she does not use drugs.  Allergies:  Allergies  Allergen Reactions   Bee Venom Swelling   Iodinated Contrast Media Anaphylaxis    Swelling  Other reaction(s): Other (See Comments)  Swelling   Swelling   Latex Hives and Rash   Morphine Other (See Comments)    BRADYCARDIA   Shellfish Allergy Anaphylaxis   Codeine Nausea And Vomiting   Meperidine Hcl Other (See Comments)    BRADYCARDIA   Bee Pollen Other (See Comments)    Unknown   Oxycodone Hives and Itching    Blisters on back   Tape Hives    Adhesive   Pentazocine Lactate Nausea And Vomiting    REACTION: vomiting with Talwin NX   Propoxyphene Nausea Only and Nausea And Vomiting    Medications reviewed.    ROS Full ROS performed and is otherwise negative other than what is stated in HPI   BP 121/73   Pulse 75   Temp 98 F (36.7 C)   Ht 5\' 4"  (1.626 m)   Wt 251 lb (113.9 kg)   SpO2 97%   BMI 43.08 kg/m   Physical Exam Vitals reviewed.  Constitutional:      Appearance: Normal appearance. She is obese. She is not  ill-appearing or diaphoretic.  Neck:     Vascular: No carotid bruit.  Cardiovascular:     Rate and Rhythm: Normal rate and regular rhythm.     Heart sounds: No murmur heard.    No friction rub.     Comments: BREAST: Chaperone present. No evidence of infection or necrotizing infection, she has dressing covering both nipple. No hematoma or obvious complications. 2 mm superficial ulceration Left Upper inner quadrant. Unsure if this was rupture cyst. Pulmonary:     Effort: No respiratory distress.     Breath sounds: No stridor. No wheezing or rhonchi.  Abdominal:     General: Abdomen is flat.  There is no distension.     Palpations: Abdomen is soft. There is no mass.     Tenderness: There is no abdominal tenderness.     Hernia: No hernia is present.  Musculoskeletal:        General: No swelling or tenderness. Normal range of motion.     Cervical back: Normal range of motion and neck supple. No rigidity or tenderness.  Skin:    Capillary Refill: Capillary refill takes less than 2 seconds.  Neurological:     General: No focal deficit present.     Mental Status: She is alert and oriented to person, place, and time.  Psychiatric:        Thought Content: Thought content normal.        Judgment: Judgment normal.     Comments: Anxious      Assessment/Plan: 52 year old female with history of left breast cancer status post bilateral mastectomies sentinel node biopsy on the left side now with definitive breast reconstruction.  She seems to be doing okay but does have some chronic pain issues and more of a chronic systemic issues rather than to complications related to recent surgery.   She was concerned about a skin lesion on the left breast upper inner quadrant.  This is minuscule and measures about 2 mm and seems to be a small erosion.  I am not concerned about any evidence of satellite lesions or metastatic disease.  Will be happy to follow.  Dr. Ulice Bold was going to address this area.  I  will be happy to remove that once she heals from the implants if this is still a concern. I will be happy to see her in about 6 months.  Please note I spent 30 minutes in this encounter including extensive review of medical records, personally reviewing operative reports from plastic surgery, placing orders, counseling the patient and performing documentation   Sterling Big, MD [facility] General Surgeon

## 2023-06-19 ENCOUNTER — Ambulatory Visit
Admission: RE | Admit: 2023-06-19 | Discharge: 2023-06-19 | Disposition: A | Discharge status: Home / Self Care | Payer: Medicaid Other | Source: Ambulatory Visit | Attending: Internal Medicine | Admitting: Internal Medicine

## 2023-06-19 DIAGNOSIS — R911 Solitary pulmonary nodule: Secondary | ICD-10-CM | POA: Insufficient documentation

## 2023-06-22 ENCOUNTER — Ambulatory Visit (INDEPENDENT_AMBULATORY_CARE_PROVIDER_SITE_OTHER): Payer: Medicaid Other | Admitting: Plastic Surgery

## 2023-06-22 ENCOUNTER — Encounter: Payer: Self-pay | Admitting: Plastic Surgery

## 2023-06-22 VITALS — BP 142/84 | HR 63 | Ht 64.0 in | Wt 250.0 lb

## 2023-06-22 DIAGNOSIS — C50812 Malignant neoplasm of overlapping sites of left female breast: Secondary | ICD-10-CM

## 2023-06-22 DIAGNOSIS — Z9013 Acquired absence of bilateral breasts and nipples: Secondary | ICD-10-CM | POA: Diagnosis not present

## 2023-06-22 DIAGNOSIS — T8543XA Leakage of breast prosthesis and implant, initial encounter: Secondary | ICD-10-CM

## 2023-06-22 NOTE — Progress Notes (Signed)
The patient is a 52 yrs old female here for follow up after bilateral removal of expanders and placement of implants on 10/2.  She has mentor smooth round ultra high profile gel 650 cc implants in place. There was concern about fluid accumulation on the left lateral breast.  She was sent for an ultrasound and no excess fluid was noted.  Today she is doing much better and feels better.  The Sylk has done very well and is likely the first her skin has not adversely reacted to a product or dressing.  Plan for follow up in next 1-2 weeks.

## 2023-06-25 ENCOUNTER — Other Ambulatory Visit: Payer: Self-pay | Admitting: *Deleted

## 2023-06-25 DIAGNOSIS — Z170 Estrogen receptor positive status [ER+]: Secondary | ICD-10-CM

## 2023-06-26 ENCOUNTER — Inpatient Hospital Stay: Payer: Medicaid Other | Attending: Internal Medicine

## 2023-06-26 ENCOUNTER — Ambulatory Visit: Payer: Medicaid Other | Admitting: Student

## 2023-06-26 ENCOUNTER — Encounter: Payer: Self-pay | Admitting: Student

## 2023-06-26 ENCOUNTER — Inpatient Hospital Stay (HOSPITAL_BASED_OUTPATIENT_CLINIC_OR_DEPARTMENT_OTHER): Payer: Medicaid Other | Admitting: Internal Medicine

## 2023-06-26 VITALS — BP 131/87 | HR 86 | Temp 98.7°F | Wt 236.0 lb

## 2023-06-26 VITALS — BP 118/77 | HR 76 | Ht 64.0 in | Wt 250.0 lb

## 2023-06-26 DIAGNOSIS — Z9013 Acquired absence of bilateral breasts and nipples: Secondary | ICD-10-CM | POA: Diagnosis not present

## 2023-06-26 DIAGNOSIS — C50812 Malignant neoplasm of overlapping sites of left female breast: Secondary | ICD-10-CM | POA: Insufficient documentation

## 2023-06-26 DIAGNOSIS — Z8541 Personal history of malignant neoplasm of cervix uteri: Secondary | ICD-10-CM | POA: Diagnosis not present

## 2023-06-26 DIAGNOSIS — Z9889 Other specified postprocedural states: Secondary | ICD-10-CM | POA: Diagnosis not present

## 2023-06-26 DIAGNOSIS — Z170 Estrogen receptor positive status [ER+]: Secondary | ICD-10-CM

## 2023-06-26 DIAGNOSIS — Z79810 Long term (current) use of selective estrogen receptor modulators (SERMs): Secondary | ICD-10-CM | POA: Insufficient documentation

## 2023-06-26 LAB — CBC WITH DIFFERENTIAL/PLATELET
Abs Immature Granulocytes: 0.01 10*3/uL (ref 0.00–0.07)
Basophils Absolute: 0 10*3/uL (ref 0.0–0.1)
Basophils Relative: 1 %
Eosinophils Absolute: 0.1 10*3/uL (ref 0.0–0.5)
Eosinophils Relative: 2 %
HCT: 38 % (ref 36.0–46.0)
Hemoglobin: 11.9 g/dL — ABNORMAL LOW (ref 12.0–15.0)
Immature Granulocytes: 0 %
Lymphocytes Relative: 23 %
Lymphs Abs: 1.1 10*3/uL (ref 0.7–4.0)
MCH: 30.7 pg (ref 26.0–34.0)
MCHC: 31.3 g/dL (ref 30.0–36.0)
MCV: 98.2 fL (ref 80.0–100.0)
Monocytes Absolute: 0.4 10*3/uL (ref 0.1–1.0)
Monocytes Relative: 8 %
Neutro Abs: 3.1 10*3/uL (ref 1.7–7.7)
Neutrophils Relative %: 66 %
Platelets: 285 10*3/uL (ref 150–400)
RBC: 3.87 MIL/uL (ref 3.87–5.11)
RDW: 14.5 % (ref 11.5–15.5)
WBC: 4.6 10*3/uL (ref 4.0–10.5)
nRBC: 0 % (ref 0.0–0.2)

## 2023-06-26 LAB — CMP (CANCER CENTER ONLY)
ALT: 18 U/L (ref 0–44)
AST: 21 U/L (ref 15–41)
Albumin: 3.8 g/dL (ref 3.5–5.0)
Alkaline Phosphatase: 87 U/L (ref 38–126)
Anion gap: 5 (ref 5–15)
BUN: 11 mg/dL (ref 6–20)
CO2: 22 mmol/L (ref 22–32)
Calcium: 8.7 mg/dL — ABNORMAL LOW (ref 8.9–10.3)
Chloride: 113 mmol/L — ABNORMAL HIGH (ref 98–111)
Creatinine: 1.14 mg/dL — ABNORMAL HIGH (ref 0.44–1.00)
GFR, Estimated: 58 mL/min — ABNORMAL LOW (ref >60)
Glucose, Bld: 101 mg/dL — ABNORMAL HIGH (ref 70–99)
Potassium: 3.8 mmol/L (ref 3.5–5.1)
Sodium: 140 mmol/L (ref 135–145)
Total Bilirubin: 0.5 mg/dL (ref 0.3–1.2)
Total Protein: 7.1 g/dL (ref 6.5–8.1)

## 2023-06-26 NOTE — Progress Notes (Cosign Needed Addendum)
Patient is a 52 year old female who underwent bilateral exchange of tissue expanders for implants, bilateral capsulotomies for implant repositioning and right breast capsulectomy with major repositioning of 3 x 8 cm of capsule on 06/06/2023 Dr. Ulice Bold.  Patient had Mentor smooth round ultrahigh profile gel implants 650 cc placed bilaterally.  Patient is almost 3 weeks postop.  Patient presents to the clinic today for postoperative follow-up.     Patient was last seen in the clinic on 06/22/2023.  At this visit, patient was doing well.  She stated that she was feeling much better.  Plan was for her to follow back up in 1 to 2 weeks.  Today, patient reports she is doing well.  She states overall she is feeling much better.  She states she sometimes still has a little bit of pain to the left side and has some limited range of motion to her left side as well.  She otherwise states that her pain is much improved and she also states that she feels the fluid to the left breast is decreasing.  Denies any fevers or chills.  States she is a little bit bothered by some of the contour of her breast, but she would not want to do anything about it until the spring time.  Denies any other issues or concerns.  Chaperone present on exam.  On exam, patient is sitting upright in no acute distress.  Right breast implant is in place and is soft.  There is no overlying erythema.  No fluid collections on exam.  Incision appears to be intact with sylke dressing.  No surrounding erythema.  No drainage.  Left breast implant is in place and is soft.  There is no overlying erythema.  There does appear to be a small amount of fluid out laterally.  No overlying skin changes.  Incision appears to be intact and healing well.  No signs of infection on exam.  To the left breast, there is also a suspicious skin lesion.  Patient states that she and Dr. Ulice Bold had talked about the skin lesion and that patient will have it removed in  about a month or so.  Patient states though that she has not had this booked yet.  Will go ahead and book the procedure.  Discussed with patient the importance of continuing compression at all times and avoiding vigorous activities.  Discussed with her to continue to monitor left breast and fluid to the left breast.  Discussed with her that if it worsens, to let us know.  Patient expressed understanding.  Also sent referral to physical therapy to help with patient's limited range of motion and pain.  Patient to follow back up in 2 to 3 weeks.  Instructed patient to call in the meantime she has any issues or concerns.  Pictures were obtained of the patient and placed in the chart with the patient's or guardian's permission.

## 2023-06-26 NOTE — Progress Notes (Signed)
[facility] CONSULT NOTE  Patient Care Team: Vanessa Mina, MD as PCP - General (Family Medicine) [facility], Vanessa Pizza, MD as PCP - Cardiology (Cardiology) Vanessa Iba, MD as Consulting Physician (Cardiology) Vanessa Chard, DO as Consulting Physician (Neurology) Vanessa Luster, RN as Oncology Nurse Navigator Vanessa Barter, MD as Consulting Physician (Oncology)  CANCER STAGING   Cancer Staging  Breast cancer [facility]) Staging form: Breast, AJCC 8th Edition - Clinical stage from 12/06/2022: Stage IA (cT1b, cN0, cM0, G2, ER+, PR+, HER2-) - Signed by Vanessa Barter, MD on 02/15/2023 Stage prefix: Initial diagnosis Histologic grading system: 3 grade system   ASSESSMENT & PLAN:  Vanessa [facility] 52 y.o. female with pmh of Right-sided hemiparesis as complication from permanent spinal stimulator placement, orthostasis on midodrine and fludrocortisone, hypertension, migraine, GERD, cholecystectomy, lumbar fusion in December 2023, gastric sleeve was referred to medical oncology for newly diagnosed left breast hormone positive cancer.  # Left breast IDC, multifocal, ER/PR positive, HER2 negative -The mass was detected on screening mammogram.  Imaging showing 3 masses. Mass 1 at 1:00 3 cm from the nipple measuring 7 x by 8 x 8 mm.  Mass 2 1:30 o'clock 1 cm from nipple measuring 9 x 7 x 9 mm.  Mass 3 at 2:00 1 cm from nipple measuring 6 x 8 x 4 mm.  No suspicious left axillary lymph node 1.  Mass 1 and mass 3 was biopsied consistent with invasive ductal cancer.  Both are strongly expressing ER and PR receptors.  HER2 negative.  -Status post bilateral mastectomies with SLNB left axilla and immediate bilateral reconstruction on 01/18/2023.  Pathology showed multifocal disease 3 separate foci- 12 mm with grade 1, 12 mm with grade 2 and 9 mm with grade 2 invasive mucinous. margins negative, 1/2 LN micrometastatic dz, 0.6 mm deposit, ER/PR >90% positive. HER2 negative.  -Oncotype DX score was  9.  No role for adjuvant chemotherapy. -No role for adjuvant radiation. -Patient decided to proceed with tamoxifen over aromatase inhibitor due to a side effect of osteoporosis.  Started on tamoxifen on 03/02/2023.  Overall has been tolerating well.  Myberitq has been switched to tolterodine due to interaction with tamoxifen.  She is on duloxetine also which is showing to decrease the efficacy of tamoxifen.  I will talk to the patient about it. -She had multiple issues with tissue expanders.  Now has implant placed.  Follows with Dr. Ulice [facility]ina.  # Right apical nodule -Detected on breast cancer staging.  CT chest without contrast showed 4 mm right apical nodule.   -Repeat CT scan showed stable right apical nodule likely benign calcified granuloma.  No further follow-up is recommended.  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  # Extreme fatigue -Iron panel and TSH was normal.  # Right-sided hemiparesis -Due to acute spinal cord injury during spinal stimulator placement.  MRI brain from October 2023 with no evidence of stroke  # Chronic back pain status post lumbar fusion -On duloxetine, muscle relaxers  # Orthostasis -Chronic.  On midodrine and fludrocortisone  #s/p gastric sleeve procedure for weight loss  # Personal history of cervical cancer s/p cryotherapy.  Tells me gets regular Pap smears.   Orders Placed This Encounter  Procedures   CBC with Differential/Platelet    Standing Status:   Future    Standing Expiration Date:   06/25/2024   CMP (Cancer [facility] only)    Standing Status:  Future    Standing Expiration Date:   06/25/2024   RTC in 6 months for MD visit, lab  The total time spent in the appointment was 30 minutes encounter with patients including review of chart and various tests results, discussions about plan of care and coordination of care plan   All questions were  answered. The patient knows to call the clinic with any problems, questions or concerns. No barriers to learning was detected.  Vanessa Barter, MD 10/22/20243:19 PM   HISTORY OF PRESENTING ILLNESS:  Vanessa [facility]ina 52 y.o. female with pmh of Right-sided hemiparesis as complication from permanent spinal stimulator placement, orthostasis on midodrine and fludrocortisone, hypertension, migraine, GERD, cholecystectomy, lumbar fusion in December 2023, gastric sleeve was referred to medical oncology for newly diagnosed left breast hormone positive cancer.  Interval history Patient was seen today as follow-up.  Accompanied today with a friend She had tissue expanders removed and has implants placed.  Recently reports fatigue which was not an issue previously.  Denies any mood changes.  Had hot flashes upon initiation of tamoxifen which has resolved now.  She is also concerned about small palpable nodule in the left upper part of the breast in the skin area.  Thinks has been growing.  Has been scheduled with Dr. Ulice [facility]ina for biopsy/excision.  I have reviewed her chart and materials related to her cancer extensively and collaborated history with the patient. Summary of oncologic history is as follows: Oncology History  Breast cancer (HCC)  11/23/2022 Mammogram   Screening mammogram-   FINDINGS: In the left breast, possible masses warrant further evaluation. In the right breast, no findings suspicious for malignancy.  Diagnostic mammogram and [name] (11/29/2022) Mass 1:   At 1 o'clock 3 cm from the nipple, there is an irregular hypoechoic mass with irregular margins. There is associated sonographic architectural distortion. It measures approximately 8 x 7 by 8 mm.   Mass 2:   At 1:30 1 cm from the nipple, there is an oval mildly hypoechoic mass with irregular margins. It measures 9 x 7 by 9 mm.   Mass 3:   At 2 o'clock 1 cm from the nipple, there is an oval hypoechoic mass with mildly  angular margins. It measures 6 by 8 x 4 mm.   There is approximately 2.1 cm between mass 1 and mass 3. Masses 1-3 span approximately 3.3 cm.   Targeted ultrasound was performed of the LEFT axilla. No suspicious axillary lymph nodes are visualized.   IMPRESSION: 1. There are 3 masses which extend along a ductal ray of the LEFT breast. One of these masses (mass 1) is a highly suspicious 8 mm mass at the site of screening mammographic concern at 1 o'clock 3 cm from the nipple with associated architectural distortion. The 2 other masses extend anteriorly from mass 1 towards the nipple and span in total 3.7 cm. Recommend 2 site ultrasound-guided biopsy for definitive characterization with preferential sampling of mass 1.  2. No suspicious LEFT axillary adenopathy.   12/06/2022 Initial Biopsy   DIAGNOSIS: A. BREAST, LEFT AT 1:00, 3 CM FROM THE NIPPLE; ULTRASOUND-GUIDED CORE NEEDLE BIOPSY: - INVASIVE MAMMARY CARCINOMA, NO SPECIAL TYPE.  Size of invasive carcinoma: 12 mm in this sample Histologic grade of invasive carcinoma: Grade 1                      Glandular/tubular differentiation score: 2  Nuclear pleomorphism score: 2                      Mitotic rate score: 1                      Total score: 5 Ductal carcinoma in situ: Present, intermediate grade Lymphovascular invasion: Not identified  LEFT AT 1:00, 3 CM FN;  HEART-SHAPED CLIP  Estrogen Receptor (ER) Status: POSITIVE          Percentage of cells with nuclear positivity: Greater than 90%          Average intensity of staining: Strong   Progesterone Receptor (PgR) Status: POSITIVE          Percentage of cells with nuclear positivity: Greater than 90%          Average intensity of staining: Strong   HER2 (by immunohistochemistry): NEGATIVE (Score 1+)   B. BREAST, LEFT AT 2:00, 1 CM FROM THE NIPPLE; ULTRASOUND-GUIDED CORE NEEDLE BIOPSY: - INVASIVE MAMMARY CARCINOMA, WITH MUCINOUS FEATURES.  Estrogen  Receptor (ER) Status: POSITIVE          Percentage of cells with nuclear positivity: Greater than 90%          Average intensity of staining: Strong   Progesterone Receptor (PgR) Status: POSITIVE          Percentage of cells with nuclear positivity: Greater than 90%          Average intensity of staining: Strong   HER2 (by immunohistochemistry): NEGATIVE (Score 0)  Size of invasive carcinoma: 4 mm in this sample Histologic grade of invasive carcinoma: Grade 2                      Glandular/tubular differentiation score: 3                      Nuclear pleomorphism score: 2                      Mitotic rate score: 1                      Total score: 6 Ductal carcinoma in situ: Not identified Lymphovascular invasion: Not identified     12/07/2022 Cancer Staging   Staging form: Breast, AJCC 8th Edition - Clinical stage from 12/06/2022: Stage IA (cT1b, cN0, cM0, G2, ER+, PR+, HER2-) - Signed by Vanessa Barter, MD on 12/08/2022 Stage prefix: Initial diagnosis Histologic grading system: 3 grade system     Genetic Testing   No pathogenic variants identified on the [facility] Hereditary Breast Cancer STAT+ Multi-Cancer+RNA panel. VUS in POLD1 called c.964C>T identified. The report date is 12/15/2022.  The Multi-Cancer + RNA Panel offered by [facility] includes sequencing and/or deletion/duplication analysis of the following 70 genes:  AIP*, ALK, APC*, ATM*, AXIN2*, BAP1*, BARD1*, BLM*, BMPR1A*, BRCA1*, BRCA2*, BRIP1*, CDC73*, CDH1*, CDK4, CDKN1B*, CDKN2A, CHEK2*, CTNNA1*, DICER1*, EPCAM, EGFR, FH*, FLCN*, GREM1, HOXB13, KIT, LZTR1, MAX*, MBD4, MEN1*, MET, MITF, MLH1*, MSH2*, MSH3*, MSH6*, MUTYH*, NF1*, NF2*, NTHL1*, PALB2*, PDGFRA, PMS2*, POLD1*, POLE*, POT1*, PRKAR1A*, PTCH1*, PTEN*, RAD51C*, RAD51D*, RB1*, RET, SDHA*, SDHAF2*, SDHB*, SDHC*, SDHD*, SMAD4*, SMARCA4*, SMARCB1*, SMARCE1*, STK11*, SUFU*, TMEM127*, TP53*, TSC1*, TSC2*, VHL*. RNA analysis is performed for * genes.   01/18/2023 Definitive  Surgery   Bilateral mastectomy with left axilla SLNB and immediate breast reconstruction by Dr. Everlene Farrier and Dr. Ulice Medina.  DIAGNOSIS: A.  BREAST, LEFT;  MASTECTOMY: - INVASIVE MAMMARY CARCINOMA, MULTIFOCAL. - DUCTAL CARCINOMA IN SITU (DCIS). - SEE CANCER SUMMARY BELOW. - TWO BIOPSY SITES WITH ASSOCIATED CLIPS. - UNREMARKABLE SKIN AND NIPPLE.  B.  BREAST, RIGHT; MASTECTOMY: - BENIGN BREAST TISSUE. - UNREMARKABLE SKIN AND NIPPLE. - NEGATIVE FOR ATYPIA AND MALIGNANCY.  C.  SENTINEL LYMPH NODES, LEFT AXILLARY 1 AND 2; EXCISION: - MICROMETASTATIC CARCINOMA INVOLVES ONE OF TWO LYMPH NODES (1/2).  CANCER CASE SUMMARY: INVASIVE CARCINOMA OF THE BREAST Standard(s): AJCC-UICC 8  SPECIMEN Procedure: Mastectomy Specimen Laterality: Left  TUMOR Histologic Type: Mucinous carcinoma Histologic Grade ([place] Histologic Score)      Glandular (Acinar)/Tubular Differentiation: 3      Nuclear Pleomorphism: 2      Mitotic Rate: 1      Overall Grade: 2 Tumor Size: 12 mm Tumor Focality: Multiple foci of invasive carcinoma (3 separate, see comment) Ductal Carcinoma In Situ (DCIS): Present, intermediate grade Tumor Extent: Not applicable Lymphatic and/or Vascular Invasion: Present Treatment Effect in the Breast: No known presurgical therapy  MARGINS Margin Status for Invasive Carcinoma: All margins negative for invasive carcinoma      Distance from closest margin: 10 mm      Specify closest margin: Anterior  Margin Status for DCIS: All margins negative for DCIS      Distance from DCIS to closest margin: 10 mm      Specify closest margin: Anterior  REGIONAL LYMPH NODES Regional Lymph Node Status: Tumor present in regional lymph node(s)      Number of Lymph Nodes with Macrometastases (greater than 2 mm): 0      Number of Lymph Nodes with Micrometastases (greater than 0.2 mm to 2 mm and/or greater than 200 cells): 1      Number of Lymph Nodes with Isolated Tumor Cells (0.2 mm or less  OR 200 cells or less): 0      Size of Largest Metastatic Deposit: 0.6 mm     Extranodal Extension: Not identified      Total Number of Lymph Nodes Examined (sentinel and non-sentinel): 2      Number of Sentinel Nodes Examined: 2   CASE SUMMARY: BREAST BIOMARKER TESTS - LEFT AT 2:00, 1 CM FN;  RIBBON-SHAPED CLIP  Estrogen Receptor (ER) Status: POSITIVE          Percentage of cells with nuclear positivity: Greater than 90%          Average intensity of staining: Strong   Progesterone Receptor (PgR) Status: POSITIVE          Percentage of cells with nuclear positivity: Greater than 90%          Average intensity of staining: Strong   HER2 (by immunohistochemistry): NEGATIVE (Score 0)       MEDICAL HISTORY:  Past Medical History:  Diagnosis Date   Anemia    Anxiety    Breast cancer (HCC) 01/18/2023   left breast   Cervical cancer (HCC)    Chronic kidney disease    Previous now clear   Claustrophobia    Depression    Family history of adverse reaction to anesthesia     " my mother takes a long time time wake up"   History of dysplastic nevus 01/18/2018   right shoulder, recurrent dysplastic nevus   History of kidney stones    History of shingles    Migraine    Multiple allergies    Obesity    Osteoarthritis    left hip   Pneumonia  PONV (postoperative nausea and vomiting)    slow to wake up   Sleep apnea    does not wear CPAP   Slow to wake up after anesthesia    Stroke (HCC) 08/11/2020   spinal cord stroke, spinal cord puncture during surgery   Wears glasses     SURGICAL HISTORY: Past Surgical History:  Procedure Laterality Date   ABDOMINAL HYSTERECTOMY     APPENDECTOMY     BACK SURGERY     BILIOPANCREATIC DIVISION W DUODENAL SWITCH N/A    BREAST BIOPSY Left 12/06/2022   u/s bx,1:00 heart clip, path pending   BREAST BIOPSY Left 12/06/2022   [name] bx 2:00 ribbon clip path pending   BREAST BIOPSY Left 12/06/2022   [name] LT BREAST BX W LOC DEV EA ADD LESION  IMG BX SPEC US GUIDE 12/06/2022 [facility]   BREAST BIOPSY Left 12/06/2022   [name] LT BREAST BX W LOC DEV 1ST LESION IMG BX SPEC US GUIDE 12/06/2022 [facility]   BREAST RECONSTRUCTION WITH PLACEMENT OF TISSUE EXPANDER AND FLEX HD (ACELLULAR HYDRATED DERMIS) Bilateral 01/18/2023   Procedure: IMMEDIATE LEFT BREAST RECONSTRUCTION WITH PLACEMENT OF TISSUE EXPANDER AND FLEX HD (ACELLULAR HYDRATED DERMIS);  Surgeon: Peggye Form, DO;  Location: [facility];  Service: Plastics;  Laterality: Bilateral;   BREAST RECONSTRUCTION WITH PLACEMENT OF TISSUE EXPANDER AND FLEX HD (ACELLULAR HYDRATED DERMIS) Left 03/26/2023   Procedure: BREAST RECONSTRUCTION WITH PLACEMENT OF TISSUE EXPANDER;  Surgeon: Peggye Form, DO;  Location: [facility];  Service: Plastics;  Laterality: Left;   CHOLECYSTECTOMY     COLONOSCOPY N/A 06/27/2021   Procedure: COLONOSCOPY;  Surgeon: Jaynie Collins, DO;  Location: [facility] END[facility]COPY;  Service: Gastroenterology;  Laterality: N/A;   DIAGNOSTIC LAPAROSCOPY     LOA   ESOPHAGOGASTRODUODENOSCOPY N/A 06/27/2021   Procedure: ESOPHAGOGASTRODUODENOSCOPY (EGD);  Surgeon: Jaynie Collins, DO;  Location: [facility] ENDOSCOPY;  Service: Gastroenterology;  Laterality: N/A;   ESOPHAGOGASTRODUODENOSCOPY (EGD) WITH PROPOFOL N/A 07/25/2017   Procedure: ESOPHAGOGASTRODUODENOSCOPY (EGD) WITH PROPOFOL;  Surgeon: Scot Jun, MD;  Location: [facility];  Service: Endoscopy;  Laterality: N/A;   KNEE ARTHROSCOPY     LAPAROSCOPIC GASTRIC SLEEVE RESECTION WITH HIATAL HERNIA REPAIR     LUMBAR FUSION  08/29/2022   Revision Lumbar two through five Laminectomy & Fusion with Transforaminal Lumbar interbody fusion   MASTECTOMY W/ SENTINEL NODE BIOPSY Bilateral 01/18/2023   Procedure: MASTECTOMY WITH SENTINEL LYMPH NODE BIOPSY, RNFA to assist;  Surgeon: Leafy Ro, MD;  Location: [facility];  Service: General;  Laterality: Bilateral;   REMOVAL OF TISSUE EXPANDER AND  PLACEMENT OF IMPLANT Bilateral 06/06/2023   Procedure: REMOVAL OF TISSUE EXPANDER AND PLACEMENT OF IMPLANT;  Surgeon: Peggye Form, DO;  Location: [facility];  Service: Plastics;  Laterality: Bilateral;   TONSILLECTOMY     TOTAL HIP ARTHROPLASTY Left 01/26/2016   Procedure: LEFT TOTAL HIP ARTHROPLASTY ANTERIOR APPROACH;  Surgeon: Tarry Kos, MD;  Location: [facility];  Service: Orthopedics;  Laterality: Left;   TRANSFORAMINAL LUMBAR INTERBODY FUSION (TLIF) WITH PEDICLE SCREW FIXATION 3 LEVEL  08/2019   Vira Browns, MD L2-L5    SOCIAL HISTORY: Social History   Socioeconomic History   Marital status: Divorced    Spouse name: Not on file   Number of children: 1   Years of education: Not on file   Highest education level: Not on file  Occupational History   Not on file  Tobacco Use   Smoking status: Never  Passive exposure: Never   Smokeless tobacco: Never  Vaping Use   Vaping status: Never Used  Substance and Sexual Activity   Alcohol use: Yes    Comment: occasional wine   Drug use: No   Sexual activity: Not Currently    Birth control/protection: Surgical    Comment: Hyst  Other Topics Concern   Not on file  Social History Narrative   Right handed    Uses cane to walk    Son has Medical and legal POA.   Social Determinants of Health   Financial Resource Strain: Medium Risk (12/11/2022)   Overall Financial Resource Strain (CARDIA)    Difficulty of Paying Living Expenses: Somewhat hard  Food Insecurity: No Food Insecurity (01/18/2023)   Hunger Vital Sign    Worried About Running Out of Food in the Last Year: Never true    Ran Out of Food in the Last Year: Never true  Recent Concern: Food Insecurity - Food Insecurity Present (12/08/2022)   Hunger Vital Sign    Worried About Running Out of Food in the Last Year: Often true    Ran Out of Food in the Last Year: Often true  Transportation Needs: No Transportation Needs (01/18/2023)   PRAPARE - Therapist, art (Medical): No    Lack of Transportation (Non-Medical): No  Physical Activity: Inactive (12/11/2022)   Exercise Vital Sign    Days of Exercise per Week: 0 days    Minutes of Exercise per Session: 0 min  Stress: Stress Concern Present (12/11/2022)   [employer] of Occupational Health - Occupational Stress Questionnaire    Feeling of Stress : To some extent  Social Connections: Moderately Isolated (12/11/2022)   Social Connection and Isolation Panel [NHANES]    Frequency of Communication with Friends and Family: Three times a week    Frequency of Social Gatherings with Friends and Family: Twice a week    Attends Religious Services: 1 to 4 times per year    Active Member of [employer] or Organizations: No    Attends Banker Meetings: Never    Marital Status: Divorced  Catering manager Violence: Not At Risk (01/18/2023)   Humiliation, Afraid, Rape, and Kick questionnaire    Fear of Current or Ex-Partner: No    Emotionally Abused: No    Physically Abused: No    Sexually Abused: No    FAMILY HISTORY: Family History  Problem Relation Age of Onset   Renal Disease Mother    Hypertension Mother    Sudden Cardiac Death Mother    Heart failure Mother    Valvular heart disease Mother    Heart disease Mother    Breast cancer Maternal Grandmother    Stroke Brother    Heart attack Brother 71   Diabetes Other     ALLERGIES:  is allergic to bee venom, iodinated contrast media, latex, morphine, shellfish allergy, codeine, meperidine hcl, bee pollen, oxycodone, tape, pentazocine lactate, and propoxyphene.  MEDICATIONS:  Current Outpatient Medications  Medication Sig Dispense Refill   baclofen (LIORESAL) 10 MG tablet Take 1 tablet (10 mg total) by mouth 3 (three) times daily. - for spasms 270 tablet 3   diazepam (VALIUM) 2 MG tablet Take 1 tablet (2 mg total) by mouth every 12 (twelve) hours as needed for up to 5 doses for muscle spasms. 5 tablet 0    diclofenac Sodium (VOLTAREN) 1 % GEL Apply 2 g topically 4 (four) times daily. 50 g 5  DULoxetine (CYMBALTA) 60 MG capsule Take 60 mg by mouth daily.     EPINEPHrine 0.3 mg/0.3 mL IJ SOAJ injection      gabapentin (NEURONTIN) 300 MG capsule Take 1 capsule (300 mg total) by mouth at bedtime. For nerve pain 90 capsule 3   methocarbamol (ROBAXIN) 500 MG tablet Take 500 mg by mouth 4 (four) times daily.     montelukast (SINGULAIR) 10 MG tablet TAKE 1 TABLET BY MOUTH AT  BEDTIME FOR ALLERGIES 30 tablet 1   Multiple Vitamin (MULTIVITAMIN WITH MINERALS) TABS tablet Take 1 tablet by mouth 2 (two) times daily.     ondansetron (ZOFRAN) 4 MG tablet Take 1 tablet (4 mg total) by mouth every 8 (eight) hours as needed for nausea or vomiting. 20 tablet 0   pramipexole (MIRAPEX) 0.125 MG tablet Take by mouth.     pyridOXINE (VITAMIN B-6) 100 MG tablet Take 1 tablet (100 mg total) by mouth daily. 90 tablet 1   tamoxifen (NOLVADEX) 20 MG tablet TAKE 1 TABLET(20 MG) BY MOUTH DAILY 90 tablet 0   tolterodine (DETROL LA) 4 MG 24 hr capsule Take 1 capsule (4 mg total) by mouth daily. 90 capsule 0   topiramate (TOPAMAX) 100 MG tablet TAKE 1 TABLET BY MOUTH AT  BEDTIME 90 tablet 3   triamcinolone (KENALOG) 0.025 % ointment Apply 1 Application topically 2 (two) times daily. 30 g 0   vitamin B-12 (CYANOCOBALAMIN) 1000 MCG tablet Take 1 tablet (1,000 mcg total) by mouth daily. 90 tablet 1   No current facility-administered medications for this visit.    REVIEW OF SYSTEMS:   Pertinent information mentioned in HPI All other systems were reviewed with the patient and are negative.  PHYSICAL EXAMINATION: ECOG PERFORMANCE STATUS: 2 - Symptomatic, <50% confined to bed  Vitals:   06/26/23 1305  BP: 131/87  Pulse: 86  Temp: 98.7 F (37.1 C)  SpO2: 99%   Filed Weights   06/26/23 1305  Weight: 236 lb (107 kg)    GENERAL:alert, no distress and comfortable SKIN: skin color, texture, turgor are normal, no rashes or  significant lesions EYES: normal, conjunctiva are pink and non-injected, sclera clear OROPHARYNX:no exudate, no erythema and lips, buccal mucosa, and tongue normal  NECK: supple, thyroid normal size, non-tender, without nodularity LYMPH:  no palpable lymphadenopathy in the cervical, axillary or inguinal LUNGS: clear to auscultation and percussion with normal breathing effort HEART: regular rate & rhythm and no murmurs and no lower extremity edema ABDOMEN:abdomen soft, non-tender and normal bowel sounds Musculoskeletal:no cyanosis of digits and no clubbing  PSYCH: alert & oriented x 3 with fluent speech NEURO: no focal motor/sensory deficits  LABORATORY DATA:  I have reviewed the data as listed Lab Results  Component Value Date   WBC 4.6 06/26/2023   HGB 11.9 (L) 06/26/2023   HCT 38.0 06/26/2023   MCV 98.2 06/26/2023   PLT 285 06/26/2023   Recent Labs    12/08/22 1237 01/18/23 1858 01/19/23 0533 06/26/23 1237  NA 137  --  142 140  K 3.8  --  3.8 3.8  CL 110  --  114* 113*  CO2 23  --  22 22  GLUCOSE 86  --  97 101*  BUN 26*  --  18 11  CREATININE 1.09* 0.99 1.01* 1.14*  CALCIUM 8.7*  --  8.6* 8.7*  GFRNONAA >60 >60 >60 58*  PROT 7.8  --   --  7.1  ALBUMIN 4.1  --   --  3.8  AST 19  --   --  21  ALT 15  --   --  18  ALKPHOS 123  --   --  87  BILITOT 0.4  --   --  0.5    RADIOGRAPHIC STUDIES: I have personally reviewed the radiological images as listed and agreed with the findings in the report. CT Chest Wo Contrast  Result Date: 06/25/2023 CLINICAL DATA:  Follow-up pulmonary nodule. History of breast cancer. EXAM: CT CHEST WITHOUT CONTRAST TECHNIQUE: Multidetector CT imaging of the chest was performed following the standard protocol without IV contrast. RADIATION DOSE REDUCTION: This exam was performed according to the departmental dose-optimization program which includes automated exposure control, adjustment of the mA and/or kV according to patient size and/or use of  iterative reconstruction technique. COMPARISON:  12/22/2022 FINDINGS: Cardiovascular: The heart is normal in size. No pericardial effusion. The aorta is normal in caliber. No aortic or coronary artery calcifications are identified. Mediastinum/Nodes: No enlarged mediastinal or axillary lymph nodes. Thyroid gland, trachea, and esophagus demonstrate no significant findings. Lungs/Pleura: The right upper lobe pulmonary nodule seen on the prior CT scan is a benign calcified granuloma. No further imaging evaluation or follow-up is necessary. No pulmonary lesions or new pulmonary nodules to suggest metastatic disease. No acute pulmonary process. No pleural effusions or pleural nodules. Upper Abdomen: Stable surgical changes related to gastric sleeve procedure. No complicating features are identified. The gallbladder is surgically absent. No biliary dilatation. No upper abdominal adenopathy. Musculoskeletal: Bilateral breast prostheses are noted. No axillary or supraclavicular adenopathy. No worrisome lytic or sclerotic bone lesions to suggest metastatic disease. IMPRESSION: 1. The right upper lobe pulmonary nodule is a benign calcified granuloma. No further imaging evaluation or follow-up is necessary. 2. No pulmonary lesions or new pulmonary nodules to suggest metastatic disease. 3. No mediastinal or hilar mass or adenopathy. 4. Stable surgical changes related to gastric sleeve procedure. No complicating features are identified. Electronically Signed   By: Rudie Meyer M.D.   On: 06/25/2023 15:49   [name] LIMITED ULTRASOUND INCLUDING AXILLA LEFT BREAST   Result Date: 06/12/2023 CLINICAL DATA:  52 year old female with history of left breast cancer status post bilateral mastectomies on May 16th. She had postoperative complications including a left breast fluid collection which required drain placement, which has subsequently been pulled. She had her implants placed on October 2nd, and 2 days started experiencing left  breast pain. Ultrasound is being performed to evaluate for recurrent fluid collection. EXAM: ULTRASOUND OF THE LEFT BREAST COMPARISON:  Previous exam(s). FINDINGS: Ultrasound was performed targeted to the upper-outer quadrant of the left breast into the axilla. There is a thin fluid collection measuring approximately 0.4 cm in with tracking along the edge of the patient's implant. The fluid collection extends about 3 centimetres along the implant. IMPRESSION: There is a thin ribbon of fluid along the patient's implant in the upper outer quadrant. The collection is not large enough to be amenable to aspiration, and is within normal limits for postoperative changes considering the recent placement of the implants. RECOMMENDATION: Clinical management of the patient's pain and swelling along the upper-outer quadrant of the left reconstructed breast. If the swelling worsens, we can repeat the imaging to determine if the fluid collection is large enough for image guided aspiration. I have discussed the findings and recommendations with the patient. If applicable, a reminder letter will be sent to the patient regarding the next appointment. BI-RADS CATEGORY  2: Benign. Electronically Signed   By: Marcelino Duster  Thomasena Edis M.D.   On: 06/12/2023 11:07

## 2023-06-26 NOTE — Progress Notes (Signed)
Patient had a CT scan on 06/19/2023. She is real fatigued.

## 2023-06-27 ENCOUNTER — Ambulatory Visit: Payer: Medicaid Other | Attending: Student

## 2023-06-27 DIAGNOSIS — M25612 Stiffness of left shoulder, not elsewhere classified: Secondary | ICD-10-CM

## 2023-06-27 DIAGNOSIS — Z9889 Other specified postprocedural states: Secondary | ICD-10-CM | POA: Insufficient documentation

## 2023-06-27 DIAGNOSIS — M25512 Pain in left shoulder: Secondary | ICD-10-CM | POA: Diagnosis present

## 2023-06-27 DIAGNOSIS — M6281 Muscle weakness (generalized): Secondary | ICD-10-CM | POA: Diagnosis present

## 2023-06-27 DIAGNOSIS — R262 Difficulty in walking, not elsewhere classified: Secondary | ICD-10-CM | POA: Insufficient documentation

## 2023-06-27 NOTE — Therapy (Signed)
OUTPATIENT PHYSICAL THERAPY SHOULDER EVALUATION   Patient Name: Vanessa Medina MRN: [id number] DOB:1971-08-17, 52 y.o., female Today's Date: 06/27/2023  END OF SESSION:  PT End of Session - 06/27/23 1004     Visit Number 1    Number of Visits 24    Date for PT Re-Evaluation 09/19/23    PT Start Time 0845    PT Stop Time 0929    PT Time Calculation (min) 44 min    Activity Tolerance Patient limited by pain    Behavior During Therapy [facility] for tasks assessed/performed             Past Medical History:  Diagnosis Date   Anemia    Anxiety    Breast cancer (HCC) 01/18/2023   left breast   Cervical cancer (HCC)    Chronic kidney disease    Previous now clear   Claustrophobia    Depression    Family history of adverse reaction to anesthesia     " my mother takes a long time time wake up"   History of dysplastic nevus 01/18/2018   right shoulder, recurrent dysplastic nevus   History of kidney stones    History of shingles    Migraine    Multiple allergies    Obesity    Osteoarthritis    left hip   Pneumonia    PONV (postoperative nausea and vomiting)    slow to wake up   Sleep apnea    does not wear CPAP   Slow to wake up after anesthesia    Stroke (HCC) 08/11/2020   spinal cord stroke, spinal cord puncture during surgery   Wears glasses    Past Surgical History:  Procedure Laterality Date   ABDOMINAL HYSTERECTOMY     APPENDECTOMY     BACK SURGERY     BILIOPANCREATIC DIVISION W DUODENAL SWITCH N/A    BREAST BIOPSY Left 12/06/2022   u/s bx,1:00 heart clip, path pending   BREAST BIOPSY Left 12/06/2022   [name] bx 2:00 ribbon clip path pending   BREAST BIOPSY Left 12/06/2022   [name] LT BREAST BX W LOC DEV EA ADD LESION IMG BX SPEC US GUIDE 12/06/2022 [facility]   BREAST BIOPSY Left 12/06/2022   [name] LT BREAST BX W LOC DEV 1ST LESION IMG BX SPEC US GUIDE 12/06/2022 [facility]   BREAST RECONSTRUCTION WITH PLACEMENT OF TISSUE EXPANDER AND FLEX HD (ACELLULAR  HYDRATED DERMIS) Bilateral 01/18/2023   Procedure: IMMEDIATE LEFT BREAST RECONSTRUCTION WITH PLACEMENT OF TISSUE EXPANDER AND FLEX HD (ACELLULAR HYDRATED DERMIS);  Surgeon: Peggye Form, DO;  Location: [facility];  Service: Plastics;  Laterality: Bilateral;   BREAST RECONSTRUCTION WITH PLACEMENT OF TISSUE EXPANDER AND FLEX HD (ACELLULAR HYDRATED DERMIS) Left 03/26/2023   Procedure: BREAST RECONSTRUCTION WITH PLACEMENT OF TISSUE EXPANDER;  Surgeon: Peggye Form, DO;  Location: [facility];  Service: Plastics;  Laterality: Left;   CHOLECYSTECTOMY     COLONOSCOPY N/A 06/27/2021   Procedure: COLONOSCOPY;  Surgeon: Jaynie Collins, DO;  Location: [facility];  Service: Gastroenterology;  Laterality: N/A;   DIAGNOSTIC LAPAROSCOPY     LOA   ESOPHAGOGASTRODUODENOSCOPY N/A 06/27/2021   Procedure: ESOPHAGOGASTRODUODENOSCOPY (EGD);  Surgeon: Jaynie Collins, DO;  Location: [facility];  Service: Gastroenterology;  Laterality: N/A;   ESOPHAGOGASTRODUODENOSCOPY (EGD) WITH PROPOFOL N/A 07/25/2017   Procedure: ESOPHAGOGASTRODUODENOSCOPY (EGD) WITH PROPOFOL;  Surgeon: Scot Jun, MD;  Location: [facility]COPY;  Service: Endoscopy;  Laterality: N/A;   KNEE ARTHROSCOPY  LAPAROSCOPIC GASTRIC SLEEVE RESECTION WITH HIATAL HERNIA REPAIR     LUMBAR FUSION  08/29/2022   Revision Lumbar two through five Laminectomy & Fusion with Transforaminal Lumbar interbody fusion   MASTECTOMY W/ SENTINEL NODE BIOPSY Bilateral 01/18/2023   Procedure: MASTECTOMY WITH SENTINEL LYMPH NODE BIOPSY, RNFA to assist;  Surgeon: Leafy Ro, MD;  Location: [facility];  Service: General;  Laterality: Bilateral;   REMOVAL OF TISSUE EXPANDER AND PLACEMENT OF IMPLANT Bilateral 06/06/2023   Procedure: REMOVAL OF TISSUE EXPANDER AND PLACEMENT OF IMPLANT;  Surgeon: Peggye Form, DO;  Location: [facility];  Service: Plastics;  Laterality: Bilateral;   TONSILLECTOMY     TOTAL  HIP ARTHROPLASTY Left 01/26/2016   Procedure: LEFT TOTAL HIP ARTHROPLASTY ANTERIOR APPROACH;  Surgeon: Tarry Kos, MD;  Location: [facility];  Service: Orthopedics;  Laterality: Left;   TRANSFORAMINAL LUMBAR INTERBODY FUSION (TLIF) WITH PEDICLE SCREW FIXATION 3 LEVEL  08/2019   Vira Browns, MD L2-L5   Patient Active Problem List   Diagnosis Date Noted   Long-term current use of tamoxifen 06/26/2023   Claustrophobia    Pernicious anemia 03/14/2023   Ruptured left breast implant 02/13/2023   S/P mastectomy, bilateral 01/18/2023   Genetic testing 12/18/2022   Breast cancer (HCC) 12/08/2022   Family history of breast cancer 12/08/2022   Profound fatigue 12/08/2022   Lumbar post-laminectomy syndrome 06/02/2022   Pseudarthrosis after fusion or arthrodesis 05/19/2022   Primary osteoarthritis of right knee 03/28/2022   Subjective tinnitus of both ears 12/21/2021   Abnormality of gait 04/29/2021   Abnormal sensation in both ears 04/06/2021   Other adverse food reactions, not elsewhere classified, subsequent encounter 04/06/2021   Multiple drug allergies 04/06/2021   Insomnia due to medical condition 02/25/2021   Myofascial muscle pain 02/25/2021   Nerve pain 10/18/2020   Incomplete paraplegia (HCC) 10/18/2020   Orthostatic hypotension 10/18/2020   Renal cyst 09/23/2020   Chronic migraine without aura without status migrainosus, not intractable    Hypokalemia    Adjustment reaction with anxiety and depression    Spinal cord injury, lumbar, without spinal bone injury, sequela (HCC) 08/18/2020   Paraparesis (HCC)    Post-operative pain    Hypotension    Slow transit constipation    AKI (acute kidney injury) (HCC)    Right leg weakness 08/11/2020   DDD (degenerative disc disease), lumbar 09/02/2019    Class: Chronic   Degenerative disc disease, lumbar 09/02/2019   Chest tightness 09/23/2018   Bradycardia 09/23/2018   BMI 40.0-44.9, adult (HCC) 04/17/2018   Status post bariatric surgery  04/17/2018   Labile blood pressure 03/08/2018   Chronic low back pain 09/03/2017   History of total hip replacement, left 01/26/2016   Osteoarthritis of spine with radiculopathy, lumbar region 10/16/2014   EDEMA 04/28/2008   LEG PAIN, BILATERAL 12/16/2007   Malignant neoplasm of cervix uteri (HCC) 07/02/2007   MORBID OBESITY 07/02/2007   DEPRESSION 07/02/2007   Migraine without aura 07/02/2007   Other allergic rhinitis 07/02/2007   Asthma 07/02/2007   GERD 07/02/2007   ELEVATED BLOOD PRESSURE WITHOUT DIAGNOSIS OF HYPERTENSION 07/02/2007   Asthma 07/02/2007    PCP: [name] MD  REFERRING PROVIDER: Caroline More  REFERRING DIAG: s/p breast reconstruction  THERAPY DIAG:  Left shoulder pain, unspecified chronicity  Stiffness of left shoulder, not elsewhere classified  Rationale for Evaluation and Treatment: Rehabilitation  ONSET DATE: December 07 2022  SUBJECTIVE:  SUBJECTIVE STATEMENT: Patient presents for L shoulder limitations s/p breast cancer reconstruction  Hand dominance: Right  PERTINENT HISTORY: Patient is having LUE ROM issues s/p breast cancer implant reconstruction. Patient underwent bilateral exchange of tissue expanders for implants, bilateral capsulotomies for implant repositioning and right breast capsulectomy with major repositioning of 3 x 8 cm of capsule on 06/06/2023. Patient had back surgery December 26th 2023 and then was diagnosed with breast cancer December 07, 2022. Had surgery Jan 18, 2023 for her breast cancer, had double mastectomy. Patient had two sets of expanders, first one burst, the second one kept flipping and moving. Had to have more work on L side. PMH includes: anemia, anxiety, breast cancer, CKD, claustrophobia, depression, migraines, OA, PONV, sleep apnea, stroke,  lumbar fusion, mastectomy.   PAIN:  Are you having pain? Yes: NPRS scale: 4/10 Pain location: L shoulder and breast Pain description: post surgical Aggravating factors: lift something  Relieving factors: none Worst: 7/10 Least: 2/10    PRECAUTIONS: Other: no lifting greater than 1/2 gallon   RED FLAGS: None   WEIGHT BEARING RESTRICTIONS:  no more than 1/2 gallon  FALLS:  Has patient fallen in last 6 months? Yes. Number of falls 1  LIVING ENVIRONMENT: Lives with: lives with their family Lives in: House/apartment Stairs: Yes: External: 2 steps; on right going up and on left going up Has following equipment at home: Single point cane, Walker - 2 wheeled, shower chair, and Grab bars  OCCUPATION: Disability   PLOF: Independent with basic ADLs  PATIENT GOALS:to decrease pain in L shoulder   NEXT MD VISIT:   OBJECTIVE:  Note: Objective measures were completed at Evaluation unless otherwise noted.  DIAGNOSTIC FINDINGS:  N/a   PATIENT SURVEYS:  FOTO 32  COGNITION: Overall cognitive status:  some short term memory      SENSATION: WFL  POSTURE: Rounding shoulders,   UPPER EXTREMITY ROM:   Active ROM Right eval Left eval  Shoulder flexion 115* 90  Shoulder extension 48 41  Shoulder abduction 81* 89  Shoulder adduction    Shoulder internal rotation    Shoulder external rotation Unable to test in testing position Unable to test in testing position  (Blank rows = not tested) *  UPPER EXTREMITY MMT: Deferred at this time due to limited ROM and pain   MMT Right eval Left eval  Shoulder flexion    Shoulder extension    Shoulder abduction    Shoulder adduction    Shoulder internal rotation    Shoulder external rotation    Middle trapezius    Lower trapezius    Elbow flexion    Elbow extension    Wrist flexion    Wrist extension    Wrist ulnar deviation    Wrist radial deviation    Wrist pronation    Wrist supination    Grip strength (lbs)     (Blank rows = not tested)  SHOULDER SPECIAL TESTS: Deferred due to limited ROM   JOINT MOBILITY TESTING:  GH: hypomobile AP, PA, inferior glide bilateral   PALPATION:  Muscle guarding subscap, pectoral, glenohumeral region  Significant fluid on L flank    TODAY'S TREATMENT:  DATE: 06/27/23  Eval only    PATIENT EDUCATION: Education details: goals, POC  Person educated: Patient Education method: Explanation, Demonstration, Tactile cues, and Verbal cues Education comprehension: verbalized understanding, returned demonstration, verbal cues required, and tactile cues required  HOME EXERCISE PROGRAM: Give next session   ASSESSMENT:  CLINICAL IMPRESSION: Patient is a 52 y.o. female who was seen today for physical therapy evaluation and treatment for LUE limited ROM s/p implant reconstruction. . Patient has limited ROM bilaterally with decreased ROM of LUE>RLE and increased pain in LUE. Patient is s/p bilateral mastectomy and implant reconstruction. She has history of increased work performed on L side due to flipping of expander. Patient is unable to lay flat or on her stomach requiring limitations of mobility. Due to limited ROM she is unable to have MMT testing performed and/or special testing. Patient will benefit from skilled physical therapy to improve ROM and strength and decrease pain for improved quality of life.   OBJECTIVE IMPAIRMENTS: Abnormal gait, decreased endurance, decreased mobility, decreased ROM, decreased strength, hypomobility, increased edema, increased fascial restrictions, impaired perceived functional ability, increased muscle spasms, impaired flexibility, impaired UE functional use, improper body mechanics, postural dysfunction, and pain.   ACTIVITY LIMITATIONS: carrying, lifting, bed mobility, bathing, toileting, dressing, self  feeding, reach over head, hygiene/grooming, and caring for others  PARTICIPATION LIMITATIONS: meal prep, cleaning, laundry, medication management, shopping, community activity, and yard work  PERSONAL FACTORS: Age, Fitness, Past/current experiences, Time since onset of injury/illness/exacerbation, and 3+ comorbidities: anemia, anxiety, breast cancer, CKD, claustrophobia, depression, migraines, OA, PONV, sleep apnea, stroke, lumbar fusion, mastectomy.   are also affecting patient's functional outcome.   REHAB POTENTIAL: Good  CLINICAL DECISION MAKING: Evolving/moderate complexity  EVALUATION COMPLEXITY: Moderate   GOALS: Goals reviewed with patient? Yes  SHORT TERM GOALS: Target date: 07/25/2023    Patient will be independent in home exercise program to improve strength/mobility for better functional independence with ADLs. Baseline: 10/23: give next session  Goal status: INITIAL    LONG TERM GOALS: Target date: 09/19/2023    Patient will increase FOTO score to equal to or greater than 49    to demonstrate statistically significant improvement in mobility and quality of life.  Baseline: 32 Goal status: INITIAL  2.  Patient will report a worst pain of 3/10 on VAS in L shoulder  to improve tolerance with ADLs and reduced symptoms with activities.  Baseline: 7/10  Goal status: INITIAL  3.  Patient will improve shoulder AROM to > 140 degrees of flexion, scaption, and abduction for improved ability to perform overhead activities. Baseline: see above Goal status: INITIAL  4.  Patient will improve shoulder strength to 4/5 for improved functional ADL and iADL performance Baseline: 40/98: unable to perform today due to pain and ROM limitations Goal status: INITIAL    PLAN:  PT FREQUENCY: 2x/week  PT DURATION: 12 weeks  PLANNED INTERVENTIONS: 97164- PT Re-evaluation, 97110-Therapeutic exercises, 97530- Therapeutic activity, 97112- Neuromuscular re-education, 97535- Self  Care, 11914- Manual therapy, 97116- Gait training, 97014- Electrical stimulation (unattended), [phone]- Electrical stimulation (manual), [id number]- Traction (mechanical), Patient/Family education, Balance training, Taping, Joint mobilization, Spinal mobilization, Manual lymph drainage, Scar mobilization, Compression bandaging, Vestibular training, Visual/preceptual remediation/compensation, Cognitive remediation, Cryotherapy, Moist heat, and Biofeedback  PLAN FOR NEXT SESSION: give HEP, swelling reduction strategies, ROM Of shoulder    Precious Bard, PT 06/27/2023, 10:36 AM

## 2023-06-28 NOTE — Plan of Care (Signed)
[facility] Tonsillectomy/Adenoidectomy, Postoperative PEDS care plan entered in error.

## 2023-07-02 ENCOUNTER — Ambulatory Visit: Payer: Medicaid Other

## 2023-07-02 DIAGNOSIS — M25512 Pain in left shoulder: Secondary | ICD-10-CM

## 2023-07-02 DIAGNOSIS — M25612 Stiffness of left shoulder, not elsewhere classified: Secondary | ICD-10-CM

## 2023-07-02 DIAGNOSIS — M6281 Muscle weakness (generalized): Secondary | ICD-10-CM

## 2023-07-02 NOTE — Therapy (Signed)
OUTPATIENT PHYSICAL THERAPY SHOULDER TREATMENT NOTE   Patient Name: Vanessa Medina MRN: [id number] DOB:10-09-1970, 52 y.o., female Today's Date: 07/02/2023  END OF SESSION:  PT End of Session - 07/02/23 1616     Visit Number 2    Number of Visits 24    Date for PT Re-Evaluation 09/19/23    PT Start Time 1617    PT Stop Time 1700    PT Time Calculation (min) 43 min    Activity Tolerance No increased pain;Patient tolerated treatment well    Behavior During Therapy [facility] for tasks assessed/performed             Past Medical History:  Diagnosis Date   Anemia    Anxiety    Breast cancer (HCC) 01/18/2023   left breast   Cervical cancer (HCC)    Chronic kidney disease    Previous now clear   Claustrophobia    Depression    Family history of adverse reaction to anesthesia     " my mother takes a long time time wake up"   History of dysplastic nevus 01/18/2018   right shoulder, recurrent dysplastic nevus   History of kidney stones    History of shingles    Migraine    Multiple allergies    Obesity    Osteoarthritis    left hip   Pneumonia    PONV (postoperative nausea and vomiting)    slow to wake up   Sleep apnea    does not wear CPAP   Slow to wake up after anesthesia    Stroke (HCC) 08/11/2020   spinal cord stroke, spinal cord puncture during surgery   Wears glasses    Past Surgical History:  Procedure Laterality Date   ABDOMINAL HYSTERECTOMY     APPENDECTOMY     BACK SURGERY     BILIOPANCREATIC DIVISION W DUODENAL SWITCH N/A    BREAST BIOPSY Left 12/06/2022   u/s bx,1:00 heart clip, path pending   BREAST BIOPSY Left 12/06/2022   [name] bx 2:00 ribbon clip path pending   BREAST BIOPSY Left 12/06/2022   [name] LT BREAST BX W LOC DEV EA ADD LESION IMG BX SPEC US GUIDE 12/06/2022 [facility]   BREAST BIOPSY Left 12/06/2022   [name] LT BREAST BX W LOC DEV 1ST LESION IMG BX SPEC US GUIDE 12/06/2022 [facility]   BREAST RECONSTRUCTION WITH PLACEMENT OF TISSUE  EXPANDER AND FLEX HD (ACELLULAR HYDRATED DERMIS) Bilateral 01/18/2023   Procedure: IMMEDIATE LEFT BREAST RECONSTRUCTION WITH PLACEMENT OF TISSUE EXPANDER AND FLEX HD (ACELLULAR HYDRATED DERMIS);  Surgeon: Peggye Form, DO;  Location: [facility];  Service: Plastics;  Laterality: Bilateral;   BREAST RECONSTRUCTION WITH PLACEMENT OF TISSUE EXPANDER AND FLEX HD (ACELLULAR HYDRATED DERMIS) Left 03/26/2023   Procedure: BREAST RECONSTRUCTION WITH PLACEMENT OF TISSUE EXPANDER;  Surgeon: Peggye Form, DO;  Location: [facility];  Service: Plastics;  Laterality: Left;   CHOLECYSTECTOMY     COLONOSCOPY N/A 06/27/2021   Procedure: COLONOSCOPY;  Surgeon: Jaynie Collins, DO;  Location[facility];  Service: Gastroenterology;  Laterality: N/A;   DIAGNOSTIC LAPAROSCOPY     LOA   ESOPHAGOGASTRODUODENOSCOPY N/A 06/27/2021   Procedure: ESOPHAGOGASTRODUODENOSCOPY (EGD);  Surgeon: Jaynie Collins, DO;  Location: [facility] ENDOSCOPY;  Service: Gastroenterology;  Laterality: N/A;   ESOPHAGOGASTRODUODENOSCOPY (EGD) WITH PROPOFOL N/A 07/25/2017   Procedure: ESOPHAGOGASTRODUODENOSCOPY (EGD) WITH PROPOFOL;  Surgeon: Scot Jun, MD;  Location: [facility] ENDOSCOPY;  Service: Endoscopy;  Laterality: N/A;   KNEE ARTHROSCOPY  LAPAROSCOPIC GASTRIC SLEEVE RESECTION WITH HIATAL HERNIA REPAIR     LUMBAR FUSION  08/29/2022   Revision Lumbar two through five Laminectomy & Fusion with Transforaminal Lumbar interbody fusion   MASTECTOMY W/ SENTINEL NODE BIOPSY Bilateral 01/18/2023   Procedure: MASTECTOMY WITH SENTINEL LYMPH NODE BIOPSY, RNFA to assist;  Surgeon: Leafy Ro, MD;  Location: [facility];  Service: General;  Laterality: Bilateral;   REMOVAL OF TISSUE EXPANDER AND PLACEMENT OF IMPLANT Bilateral 06/06/2023   Procedure: REMOVAL OF TISSUE EXPANDER AND PLACEMENT OF IMPLANT;  Surgeon: Peggye Form, DO;  Location: [facility];  Service: Plastics;  Laterality:  Bilateral;   TONSILLECTOMY     TOTAL HIP ARTHROPLASTY Left 01/26/2016   Procedure: LEFT TOTAL HIP ARTHROPLASTY ANTERIOR APPROACH;  Surgeon: Tarry Kos, MD;  Location: [facility];  Service: Orthopedics;  Laterality: Left;   TRANSFORAMINAL LUMBAR INTERBODY FUSION (TLIF) WITH PEDICLE SCREW FIXATION 3 LEVEL  08/2019   Vira Browns, MD L2-L5   Patient Active Problem List   Diagnosis Date Noted   Long-term current use of tamoxifen 06/26/2023   Claustrophobia    Pernicious anemia 03/14/2023   Ruptured left breast implant 02/13/2023   S/P mastectomy, bilateral 01/18/2023   Genetic testing 12/18/2022   Breast cancer (HCC) 12/08/2022   Family history of breast cancer 12/08/2022   Profound fatigue 12/08/2022   Lumbar post-laminectomy syndrome 06/02/2022   Pseudarthrosis after fusion or arthrodesis 05/19/2022   Primary osteoarthritis of right knee 03/28/2022   Subjective tinnitus of both ears 12/21/2021   Abnormality of gait 04/29/2021   Abnormal sensation in both ears 04/06/2021   Other adverse food reactions, not elsewhere classified, subsequent encounter 04/06/2021   Multiple drug allergies 04/06/2021   Insomnia due to medical condition 02/25/2021   Myofascial muscle pain 02/25/2021   Nerve pain 10/18/2020   Incomplete paraplegia (HCC) 10/18/2020   Orthostatic hypotension 10/18/2020   Renal cyst 09/23/2020   Chronic migraine without aura without status migrainosus, not intractable    Hypokalemia    Adjustment reaction with anxiety and depression    Spinal cord injury, lumbar, without spinal bone injury, sequela (HCC) 08/18/2020   Paraparesis (HCC)    Post-operative pain    Hypotension    Slow transit constipation    AKI (acute kidney injury) (HCC)    Right leg weakness 08/11/2020   DDD (degenerative disc disease), lumbar 09/02/2019    Class: Chronic   Degenerative disc disease, lumbar 09/02/2019   Chest tightness 09/23/2018   Bradycardia 09/23/2018   BMI 40.0-44.9, adult (HCC)  04/17/2018   Status post bariatric surgery 04/17/2018   Labile blood pressure 03/08/2018   Chronic low back pain 09/03/2017   History of total hip replacement, left 01/26/2016   Osteoarthritis of spine with radiculopathy, lumbar region 10/16/2014   EDEMA 04/28/2008   LEG PAIN, BILATERAL 12/16/2007   Malignant neoplasm of cervix uteri (HCC) 07/02/2007   MORBID OBESITY 07/02/2007   DEPRESSION 07/02/2007   Migraine without aura 07/02/2007   Other allergic rhinitis 07/02/2007   Asthma 07/02/2007   GERD 07/02/2007   ELEVATED BLOOD PRESSURE WITHOUT DIAGNOSIS OF HYPERTENSION 07/02/2007   Asthma 07/02/2007    PCP: [name] MD  REFERRING PROVIDER: Caroline More  REFERRING DIAG: s/p breast reconstruction  THERAPY DIAG:  Stiffness of left shoulder, not elsewhere classified  Muscle weakness (generalized)  Left shoulder pain, unspecified chronicity  Rationale for Evaluation and Treatment: Rehabilitation  ONSET DATE: December 07 2022  SUBJECTIVE:  SUBJECTIVE STATEMENT: Pt reports bilat shoulder pain. Pt rates current pain level 6/10.  Pt reports dealing with a lot of fatigue, but can on a good day walk a long duration. However, this past month and a half down to a mile on a good day.  Pt reports she has been doing shoulder AROM (abduction) exercise given to her at hospital as part of home exercise.  Hand dominance: Right  PERTINENT HISTORY: Patient is having LUE ROM issues s/p breast cancer implant reconstruction. Patient underwent bilateral exchange of tissue expanders for implants, bilateral capsulotomies for implant repositioning and right breast capsulectomy with major repositioning of 3 x 8 cm of capsule on 06/06/2023. Patient had back surgery December 26th 2023 and then was diagnosed with breast  cancer December 07, 2022. Had surgery Jan 18, 2023 for her breast cancer, had double mastectomy. Patient had two sets of expanders, first one burst, the second one kept flipping and moving. Had to have more work on L side. PMH includes: anemia, anxiety, breast cancer, CKD, claustrophobia, depression, migraines, OA, PONV, sleep apnea, stroke, lumbar fusion, mastectomy.   PAIN:  Are you having pain? Yes: NPRS scale: 4/10 Pain location: L shoulder and breast Pain description: post surgical Aggravating factors: lift something  Relieving factors: none Worst: 7/10 Least: 2/10    PRECAUTIONS: Other: no lifting greater than 1/2 gallon   RED FLAGS: None   WEIGHT BEARING RESTRICTIONS:  no more than 1/2 gallon  FALLS:  Has patient fallen in last 6 months? Yes. Number of falls 1  LIVING ENVIRONMENT: Lives with: lives with their family Lives in: House/apartment Stairs: Yes: External: 2 steps; on right going up and on left going up Has following equipment at home: Single point cane, Walker - 2 wheeled, shower chair, and Grab bars  OCCUPATION: Disability   PLOF: Independent with basic ADLs  PATIENT GOALS:to decrease pain in L shoulder   NEXT MD VISIT:   OBJECTIVE:  Note: Objective measures were completed at Evaluation unless otherwise noted.  DIAGNOSTIC FINDINGS:  N/a   PATIENT SURVEYS:  FOTO 32  COGNITION: Overall cognitive status:  some short term memory      SENSATION: WFL  POSTURE: Rounding shoulders,   UPPER EXTREMITY ROM:   Active ROM Right eval Left eval  Shoulder flexion 115* 90  Shoulder extension 48 41  Shoulder abduction 81* 89  Shoulder adduction    Shoulder internal rotation    Shoulder external rotation Unable to test in testing position Unable to test in testing position  (Blank rows = not tested) *  UPPER EXTREMITY MMT: Deferred at this time due to limited ROM and pain   MMT Right eval Left eval  Shoulder flexion    Shoulder extension     Shoulder abduction    Shoulder adduction    Shoulder internal rotation    Shoulder external rotation    Middle trapezius    Lower trapezius    Elbow flexion    Elbow extension    Wrist flexion    Wrist extension    Wrist ulnar deviation    Wrist radial deviation    Wrist pronation    Wrist supination    Grip strength (lbs)    (Blank rows = not tested)  SHOULDER SPECIAL TESTS: Deferred due to limited ROM   JOINT MOBILITY TESTING:  GH: hypomobile AP, PA, inferior glide bilateral   PALPATION:  Muscle guarding subscap, pectoral, glenohumeral region  Significant fluid on L flank    TODAY'S TREATMENT:  DATE: 07/02/23   TE: Seated- Shrugs 3x10 Scapular squeeze 3x10 Shoulder circles 3x10 Comments: Cuing to perform all within pain-free range  Seated AROM L shoulder abduction with elbow flexed 90 deg with addition of hand "pump" to target ROM, strength and swelling x multiple reps  Soft theraputty demo and pt completed 2 min today for grip strengthening  Manual: On plinth- PROM L shoulder flexion and scaption within pain-free ranges x multiple reps  STM L UT and anterior delt, cervical paraspinals x 4 min - pt reports TTP near anterior delt (performed around not over scar tissue)   Reviewed and issued HEP during session. Instruction to perform within pain-free range/intensity, discontinue at home if experience pain increase until can be reassessed by PT Reviewed benefits of walking program  Access Code: [email or web address] URL: [email or web address] Date: 07/02/2023 Prepared by: Temple Pacini  Exercises - Seated Shoulder Shrugs  - 2 x daily - 6-7 x weekly - 3 sets - 10 reps - Seated Shoulder Shrug Circles AROM Backward  - 2 x daily - 6-7 x weekly - 3 sets - 10 reps - Seated Scapular Retraction  - 1 x daily - 6-7 x weekly - 3 sets -  10 reps **written and issued soft theraputty for grip strengthening 2x5 min daily    PATIENT EDUCATION: Education details: exercise technique, HEP, decrease ROM with all HEP interventions as necessary to improve comfort and perform pain-free Person educated: Patient Education method: Explanation, Demonstration, Tactile cues, Verbal cues, and Handouts Education comprehension: verbalized understanding, returned demonstration, verbal cues required, and tactile cues required  HOME EXERCISE PROGRAM: Access Code: [email or web address] URL: [email or web address] Date: 07/02/2023 Prepared by: Temple Pacini  Exercises - Seated Shoulder Shrugs  - 2 x daily - 6-7 x weekly - 3 sets - 10 reps - Seated Shoulder Shrug Circles AROM Backward  - 2 x daily - 6-7 x weekly - 3 sets - 10 reps - Seated Scapular Retraction  - 1 x daily - 6-7 x weekly - 3 sets - 10 reps  ASSESSMENT:  CLINICAL IMPRESSION: HEP initiated this visit, all interventions performed with same sets and reps as issued during visit, and pt able to demo pain-free technique in session.  PT also reviewed swelling reduction strategies and benefit of walking for cardio. Plan to initiate cardio on nustep as part of program and/or assess next visit. Patient will benefit from skilled physical therapy to improve ROM and strength and decrease pain for improved quality of life.     OBJECTIVE IMPAIRMENTS: Abnormal gait, decreased endurance, decreased mobility, decreased ROM, decreased strength, hypomobility, increased edema, increased fascial restrictions, impaired perceived functional ability, increased muscle spasms, impaired flexibility, impaired UE functional use, improper body mechanics, postural dysfunction, and pain.   ACTIVITY LIMITATIONS: carrying, lifting, bed mobility, bathing, toileting, dressing, self feeding, reach over head, hygiene/grooming, and caring for others  PARTICIPATION LIMITATIONS: meal prep, cleaning, laundry,  medication management, shopping, community activity, and yard work  PERSONAL FACTORS: Age, Fitness, Past/current experiences, Time since onset of injury/illness/exacerbation, and 3+ comorbidities: anemia, anxiety, breast cancer, CKD, claustrophobia, depression, migraines, OA, PONV, sleep apnea, stroke, lumbar fusion, mastectomy.   are also affecting patient's functional outcome.   REHAB POTENTIAL: Good  CLINICAL DECISION MAKING: Evolving/moderate complexity  EVALUATION COMPLEXITY: Moderate   GOALS: Goals reviewed with patient? Yes  SHORT TERM GOALS: Target date: 07/25/2023    Patient will be independent in home exercise program to improve strength/mobility for better functional independence with ADLs. Baseline: 10/23: give next session  Goal status:  INITIAL    LONG TERM GOALS: Target date: 09/19/2023    Patient will increase FOTO score to equal to or greater than 49    to demonstrate statistically significant improvement in mobility and quality of life.  Baseline: 32 Goal status: INITIAL  2.  Patient will report a worst pain of 3/10 on VAS in L shoulder  to improve tolerance with ADLs and reduced symptoms with activities.  Baseline: 7/10  Goal status: INITIAL  3.  Patient will improve shoulder AROM to > 140 degrees of flexion, scaption, and abduction for improved ability to perform overhead activities. Baseline: see above Goal status: INITIAL  4.  Patient will improve shoulder strength to 4/5 for improved functional ADL and iADL performance Baseline: 16/10: unable to perform today due to pain and ROM limitations Goal status: INITIAL    PLAN:  PT FREQUENCY: 2x/week  PT DURATION: 12 weeks  PLANNED INTERVENTIONS: 97164- PT Re-evaluation, 97110-Therapeutic exercises, 97530- Therapeutic activity, 97112- Neuromuscular re-education, 97535- Self Care, 96045- Manual therapy, 97116- Gait training, 97014- Electrical stimulation (unattended), [phone]- Electrical stimulation  (manual), [id number]- Traction (mechanical), Patient/Family education, Balance training, Taping, Joint mobilization, Spinal mobilization, Manual lymph drainage, Scar mobilization, Compression bandaging, Vestibular training, Visual/preceptual remediation/compensation, Cognitive remediation, Cryotherapy, Moist heat, and Biofeedback  PLAN FOR NEXT SESSION:  swelling reduction strategies, ROM Of shoulder, nustep,   Baird Kay, PT 07/02/2023, 5:15 PM

## 2023-07-05 ENCOUNTER — Ambulatory Visit: Payer: Medicaid Other

## 2023-07-05 DIAGNOSIS — M25612 Stiffness of left shoulder, not elsewhere classified: Secondary | ICD-10-CM

## 2023-07-05 DIAGNOSIS — M25512 Pain in left shoulder: Secondary | ICD-10-CM | POA: Diagnosis not present

## 2023-07-05 DIAGNOSIS — R262 Difficulty in walking, not elsewhere classified: Secondary | ICD-10-CM

## 2023-07-05 DIAGNOSIS — M6281 Muscle weakness (generalized): Secondary | ICD-10-CM

## 2023-07-05 NOTE — Therapy (Signed)
OUTPATIENT PHYSICAL THERAPY SHOULDER TREATMENT NOTE/RECERT   Patient Name: Vanessa Medina MRN: [id number] DOB:01-Jun-1971, 52 y.o., female Today's Date: 07/05/2023  END OF SESSION:  PT End of Session - 07/05/23 1340     Visit Number 3    Number of Visits 24    Date for PT Re-Evaluation 09/19/23    PT Start Time 0848    PT Stop Time 0929    PT Time Calculation (min) 41 min    Activity Tolerance Patient tolerated treatment well;Patient limited by pain    Behavior During Therapy [facility] for tasks assessed/performed              Past Medical History:  Diagnosis Date   Anemia    Anxiety    Breast cancer (HCC) 01/18/2023   left breast   Cervical cancer (HCC)    Chronic kidney disease    Previous now clear   Claustrophobia    Depression    Family history of adverse reaction to anesthesia     " my mother takes a long time time wake up"   History of dysplastic nevus 01/18/2018   right shoulder, recurrent dysplastic nevus   History of kidney stones    History of shingles    Migraine    Multiple allergies    Obesity    Osteoarthritis    left hip   Pneumonia    PONV (postoperative nausea and vomiting)    slow to wake up   Sleep apnea    does not wear CPAP   Slow to wake up after anesthesia    Stroke (HCC) 08/11/2020   spinal cord stroke, spinal cord puncture during surgery   Wears glasses    Past Surgical History:  Procedure Laterality Date   ABDOMINAL HYSTERECTOMY     APPENDECTOMY     BACK SURGERY     BILIOPANCREATIC DIVISION W DUODENAL SWITCH N/A    BREAST BIOPSY Left 12/06/2022   u/s bx,1:00 heart clip, path pending   BREAST BIOPSY Left 12/06/2022   [name] bx 2:00 ribbon clip path pending   BREAST BIOPSY Left 12/06/2022   [name] LT BREAST BX W LOC DEV EA ADD LESION IMG BX SPEC US GUIDE 12/06/2022 [facility]   BREAST BIOPSY Left 12/06/2022   [name] LT BREAST BX W LOC DEV 1ST LESION IMG BX SPEC US GUIDE 12/06/2022 [facility]   BREAST RECONSTRUCTION WITH  PLACEMENT OF TISSUE EXPANDER AND FLEX HD (ACELLULAR HYDRATED DERMIS) Bilateral 01/18/2023   Procedure: IMMEDIATE LEFT BREAST RECONSTRUCTION WITH PLACEMENT OF TISSUE EXPANDER AND FLEX HD (ACELLULAR HYDRATED DERMIS);  Surgeon: Peggye Form, DO;  Location: [facility];  Service: Plastics;  Laterality: Bilateral;   BREAST RECONSTRUCTION WITH PLACEMENT OF TISSUE EXPANDER AND FLEX HD (ACELLULAR HYDRATED DERMIS) Left 03/26/2023   Procedure: BREAST RECONSTRUCTION WITH PLACEMENT OF TISSUE EXPANDER;  Surgeon: Peggye Form, DO;  Location: [facility];  Service: Plastics;  Laterality: Left;   CHOLECYSTECTOMY     COLONOSCOPY N/A 06/27/2021   Procedure: COLONOSCOPY;  Surgeon: Jaynie Collins, DO;  Location: [facility];  Service: Gastroenterology;  Laterality: N/A;   DIAGNOSTIC LAPAROSCOPY     LOA   ESOPHAGOGASTRODUODENOSCOPY N/A 06/27/2021   Procedure: ESOPHAGOGASTRODUODENOSCOPY (EGD);  Surgeon: Jaynie Collins, DO;  Location: [facility];  Service: Gastroenterology;  Laterality: N/A;   ESOPHAGOGASTRODUODENOSCOPY (EGD) WITH PROPOFOL N/A 07/25/2017   Procedure: ESOPHAGOGASTRODUODENOSCOPY (EGD) WITH PROPOFOL;  Surgeon: Scot Jun, MD;  Location: [facility] ENDOSCOPY;  Service: Endoscopy;  Laterality: N/A;  KNEE ARTHROSCOPY     LAPAROSCOPIC GASTRIC SLEEVE RESECTION WITH HIATAL HERNIA REPAIR     LUMBAR FUSION  08/29/2022   Revision Lumbar two through five Laminectomy & Fusion with Transforaminal Lumbar interbody fusion   MASTECTOMY W/ SENTINEL NODE BIOPSY Bilateral 01/18/2023   Procedure: MASTECTOMY WITH SENTINEL LYMPH NODE BIOPSY, RNFA to assist;  Surgeon: Leafy Ro, MD;  Location: [facility];  Service: General;  Laterality: Bilateral;   REMOVAL OF TISSUE EXPANDER AND PLACEMENT OF IMPLANT Bilateral 06/06/2023   Procedure: REMOVAL OF TISSUE EXPANDER AND PLACEMENT OF IMPLANT;  Surgeon: Peggye Form, DO;  Location: [facility];  Service: Plastics;   Laterality: Bilateral;   TONSILLECTOMY     TOTAL HIP ARTHROPLASTY Left 01/26/2016   Procedure: LEFT TOTAL HIP ARTHROPLASTY ANTERIOR APPROACH;  Surgeon: Tarry Kos, MD;  Location: [facility];  Service: Orthopedics;  Laterality: Left;   TRANSFORAMINAL LUMBAR INTERBODY FUSION (TLIF) WITH PEDICLE SCREW FIXATION 3 LEVEL  08/2019   Vira Browns, MD L2-L5   Patient Active Problem List   Diagnosis Date Noted   Long-term current use of tamoxifen 06/26/2023   Claustrophobia    Pernicious anemia 03/14/2023   Ruptured left breast implant 02/13/2023   S/P mastectomy, bilateral 01/18/2023   Genetic testing 12/18/2022   Breast cancer (HCC) 12/08/2022   Family history of breast cancer 12/08/2022   Profound fatigue 12/08/2022   Lumbar post-laminectomy syndrome 06/02/2022   Pseudarthrosis after fusion or arthrodesis 05/19/2022   Primary osteoarthritis of right knee 03/28/2022   Subjective tinnitus of both ears 12/21/2021   Abnormality of gait 04/29/2021   Abnormal sensation in both ears 04/06/2021   Other adverse food reactions, not elsewhere classified, subsequent encounter 04/06/2021   Multiple drug allergies 04/06/2021   Insomnia due to medical condition 02/25/2021   Myofascial muscle pain 02/25/2021   Nerve pain 10/18/2020   Incomplete paraplegia (HCC) 10/18/2020   Orthostatic hypotension 10/18/2020   Renal cyst 09/23/2020   Chronic migraine without aura without status migrainosus, not intractable    Hypokalemia    Adjustment reaction with anxiety and depression    Spinal cord injury, lumbar, without spinal bone injury, sequela (HCC) 08/18/2020   Paraparesis (HCC)    Post-operative pain    Hypotension    Slow transit constipation    AKI (acute kidney injury) (HCC)    Right leg weakness 08/11/2020   DDD (degenerative disc disease), lumbar 09/02/2019    Class: Chronic   Degenerative disc disease, lumbar 09/02/2019   Chest tightness 09/23/2018   Bradycardia 09/23/2018   BMI 40.0-44.9, adult  (HCC) 04/17/2018   Status post bariatric surgery 04/17/2018   Labile blood pressure 03/08/2018   Chronic low back pain 09/03/2017   History of total hip replacement, left 01/26/2016   Osteoarthritis of spine with radiculopathy, lumbar region 10/16/2014   EDEMA 04/28/2008   LEG PAIN, BILATERAL 12/16/2007   Malignant neoplasm of cervix uteri (HCC) 07/02/2007   MORBID OBESITY 07/02/2007   DEPRESSION 07/02/2007   Migraine without aura 07/02/2007   Other allergic rhinitis 07/02/2007   Asthma 07/02/2007   GERD 07/02/2007   ELEVATED BLOOD PRESSURE WITHOUT DIAGNOSIS OF HYPERTENSION 07/02/2007   Asthma 07/02/2007    PCP: [name] MD  REFERRING PROVIDER: Caroline More  REFERRING DIAG: s/p breast reconstruction  THERAPY DIAG:  Muscle weakness (generalized)  Stiffness of left shoulder, not elsewhere classified  Left shoulder pain, unspecified chronicity  Difficulty in walking, not elsewhere classified  Rationale for Evaluation and Treatment: Rehabilitation  ONSET DATE: December 07 2022  SUBJECTIVE:                                                                                                                                                                                      SUBJECTIVE STATEMENT: Pt reports pain is the same as last time (rated previously a 6/10). Pt reports continued fatigue. She is using SPC today. Hand dominance: Right  PERTINENT HISTORY: Patient is having LUE ROM issues s/p breast cancer implant reconstruction. Patient underwent bilateral exchange of tissue expanders for implants, bilateral capsulotomies for implant repositioning and right breast capsulectomy with major repositioning of 3 x 8 cm of capsule on 06/06/2023. Patient had back surgery December 26th 2023 and then was diagnosed with breast cancer December 07, 2022. Had surgery Jan 18, 2023 for her breast cancer, had double mastectomy. Patient had two sets of expanders, first one burst, the second one  kept flipping and moving. Had to have more work on L side. PMH includes: anemia, anxiety, breast cancer, CKD, claustrophobia, depression, migraines, OA, PONV, sleep apnea, stroke, lumbar fusion, mastectomy.   PAIN:  Are you having pain? Yes: NPRS scale: 4/10 Pain location: L shoulder and breast Pain description: post surgical Aggravating factors: lift something  Relieving factors: none Worst: 7/10 Least: 2/10    PRECAUTIONS: Other: no lifting greater than 1/2 gallon   RED FLAGS: None   WEIGHT BEARING RESTRICTIONS:  no more than 1/2 gallon  FALLS:  Has patient fallen in last 6 months? Yes. Number of falls 1  LIVING ENVIRONMENT: Lives with: lives with their family Lives in: House/apartment Stairs: Yes: External: 2 steps; on right going up and on left going up Has following equipment at home: Single point cane, Walker - 2 wheeled, shower chair, and Grab bars  OCCUPATION: Disability   PLOF: Independent with basic ADLs  PATIENT GOALS:to decrease pain in L shoulder   NEXT MD VISIT:   OBJECTIVE:  Note: Objective measures were completed at Evaluation unless otherwise noted.  DIAGNOSTIC FINDINGS:  N/a   PATIENT SURVEYS:  FOTO 32  COGNITION: Overall cognitive status:  some short term memory      SENSATION: WFL  POSTURE: Rounding shoulders,   UPPER EXTREMITY ROM:   Active ROM Right eval Left eval  Shoulder flexion 115* 90  Shoulder extension 48 41  Shoulder abduction 81* 89  Shoulder adduction    Shoulder internal rotation    Shoulder external rotation Unable to test in testing position Unable to test in testing position  (Blank rows = not tested) *  UPPER EXTREMITY MMT: Deferred at this time due to limited ROM and pain   MMT Right  eval Left eval  Shoulder flexion    Shoulder extension    Shoulder abduction    Shoulder adduction    Shoulder internal rotation    Shoulder external rotation    Middle trapezius    Lower trapezius    Elbow flexion     Elbow extension    Wrist flexion    Wrist extension    Wrist ulnar deviation    Wrist radial deviation    Wrist pronation    Wrist supination    Grip strength (lbs)    (Blank rows = not tested)  SHOULDER SPECIAL TESTS: Deferred due to limited ROM   JOINT MOBILITY TESTING:  GH: hypomobile AP, PA, inferior glide bilateral   PALPATION:  Muscle guarding subscap, pectoral, glenohumeral region  Significant fluid on L flank    TODAY'S TREATMENT:                                                                                                                                         DATE: 07/05/23   TE:  : 652 ft no AD (goal >1000 ft)   Seated- Scapular squeeze 2x10 -rates medium Seated shrugs 2x10 Comments: Cuing to perform all within pain-free range  Manual: On plinth- PROM/AAROM L shoulder flexion, IR/ER and scaption within pain-free ranges x multiple reps  (no greater than neutral with ER) STM L UT and anterior delt, posterior shoulder mm x 5 min - continue TrP near anterior delt (performed around not over scar tissue)   PATIENT EDUCATION: Education details: exercise technique, HEP, decrease ROM with all HEP interventions as necessary to improve comfort and perform pain-free Person educated: Patient Education method: Explanation, Demonstration, Tactile cues, Verbal cues, and Handouts Education comprehension: verbalized understanding, returned demonstration, verbal cues required, and tactile cues required  HOME EXERCISE PROGRAM: Access Code: [email or web address] URL: [email or web address] Date: 07/02/2023 Prepared by: Temple Pacini  Exercises - Seated Shoulder Shrugs  - 2 x daily - 6-7 x weekly - 3 sets - 10 reps - Seated Shoulder Shrug Circles AROM Backward  - 2 x daily - 6-7 x weekly - 3 sets - 10 reps - Seated Scapular Retraction  - 1 x daily - 6-7 x weekly - 3 sets - 10 reps  ASSESSMENT:  CLINICAL IMPRESSION: assessed and pt exhibits impairment with  gait capacity/functional capacity. PT recerting to add goal to address this deficit. Pt tolerated interventions fair, is generally limited to just under 90 deg flex/abd currently due to pain. Performed interventions in pain-free ranges as a result. PT sending referral request for lymphedema OT as pt with continued swelling. Patient will benefit from skilled physical therapy to improve ROM and strength and decrease pain for improved quality of life.     OBJECTIVE IMPAIRMENTS: Abnormal gait, decreased endurance, decreased mobility, decreased ROM, decreased strength, hypomobility, increased edema, increased fascial restrictions, impaired perceived functional ability, increased muscle spasms, impaired flexibility, impaired  UE functional use, improper body mechanics, postural dysfunction, and pain.   ACTIVITY LIMITATIONS: carrying, lifting, bed mobility, bathing, toileting, dressing, self feeding, reach over head, hygiene/grooming, and caring for others  PARTICIPATION LIMITATIONS: meal prep, cleaning, laundry, medication management, shopping, community activity, and yard work  PERSONAL FACTORS: Age, Fitness, Past/current experiences, Time since onset of injury/illness/exacerbation, and 3+ comorbidities: anemia, anxiety, breast cancer, CKD, claustrophobia, depression, migraines, OA, PONV, sleep apnea, stroke, lumbar fusion, mastectomy.   are also affecting patient's functional outcome.   REHAB POTENTIAL: Good  CLINICAL DECISION MAKING: Evolving/moderate complexity  EVALUATION COMPLEXITY: Moderate   GOALS: Goals reviewed with patient? Yes  SHORT TERM GOALS: Target date: 07/25/2023    Patient will be independent in home exercise program to improve strength/mobility for better functional independence with ADLs. Baseline: 10/23: give next session  Goal status: INITIAL    LONG TERM GOALS: Target date: 09/19/2023    Patient will increase FOTO score to equal to or greater than 49    to  demonstrate statistically significant improvement in mobility and quality of life.  Baseline: 32 Goal status: INITIAL  2.  Patient will report a worst pain of 3/10 on VAS in L shoulder  to improve tolerance with ADLs and reduced symptoms with activities.  Baseline: 7/10  Goal status: INITIAL  3.  Patient will improve shoulder AROM to > 140 degrees of flexion, scaption, and abduction for improved ability to perform overhead activities. Baseline: see above Goal status: INITIAL  4.  Patient will improve shoulder strength to 4/5 for improved functional ADL and iADL performance Baseline: 62/95: unable to perform today due to pain and ROM limitations Goal status: INITIAL  5. Patient will increase six minute walk test distance to >1000 for progression to community ambulator and improve gait ability  Baseline: 652 ft with SPC  Goal status: INITIAL   PLAN:  PT FREQUENCY: 2x/week  PT DURATION: 12 weeks  PLANNED INTERVENTIONS: 97164- PT Re-evaluation, 97110-Therapeutic exercises, 97530- Therapeutic activity, 97112- Neuromuscular re-education, 97535- Self Care, 28413- Manual therapy, 97116- Gait training, 97014- Electrical stimulation (unattended), [phone]- Electrical stimulation (manual), [id number]- Traction (mechanical), Patient/Family education, Balance training, Taping, Joint mobilization, Spinal mobilization, Manual lymph drainage, Scar mobilization, Compression bandaging, Vestibular training, Visual/preceptual remediation/compensation, Cognitive remediation, Cryotherapy, Moist heat, and Biofeedback  PLAN FOR NEXT SESSION:  swelling reduction strategies, ROM Of shoulder, nustep, progress HEP as pt able   Baird Kay, PT 07/05/2023, 1:46 PM

## 2023-07-06 ENCOUNTER — Other Ambulatory Visit: Payer: Self-pay | Admitting: Internal Medicine

## 2023-07-06 MED ORDER — DULOXETINE HCL 30 MG PO CPEP: 30.0000 mg | ORAL_CAPSULE | Freq: Every day | ORAL | 0 refills | Status: DC

## 2023-07-06 NOTE — Progress Notes (Signed)
I discussed with the patient.  She is on Cymbalta 60 mg daily which is showing interaction with tamoxifen decreasing its effects.  She is okay with discontinuing it.  So we will taper down to 30 mg for a week and then she can stop it.  I have sent a prescription for 30 mg for 7 days.  She is already on gabapentin and if need be there is room to go up on the dose.

## 2023-07-10 ENCOUNTER — Encounter: Payer: Self-pay | Admitting: *Deleted

## 2023-07-10 ENCOUNTER — Ambulatory Visit: Payer: Medicaid Other | Attending: Student

## 2023-07-10 DIAGNOSIS — L905 Scar conditions and fibrosis of skin: Secondary | ICD-10-CM | POA: Insufficient documentation

## 2023-07-10 DIAGNOSIS — M6281 Muscle weakness (generalized): Secondary | ICD-10-CM | POA: Insufficient documentation

## 2023-07-10 DIAGNOSIS — C50812 Malignant neoplasm of overlapping sites of left female breast: Secondary | ICD-10-CM

## 2023-07-10 DIAGNOSIS — G8929 Other chronic pain: Secondary | ICD-10-CM | POA: Insufficient documentation

## 2023-07-10 DIAGNOSIS — M25512 Pain in left shoulder: Secondary | ICD-10-CM | POA: Insufficient documentation

## 2023-07-10 DIAGNOSIS — R262 Difficulty in walking, not elsewhere classified: Secondary | ICD-10-CM | POA: Diagnosis present

## 2023-07-10 DIAGNOSIS — M25611 Stiffness of right shoulder, not elsewhere classified: Secondary | ICD-10-CM | POA: Insufficient documentation

## 2023-07-10 DIAGNOSIS — M25612 Stiffness of left shoulder, not elsewhere classified: Secondary | ICD-10-CM | POA: Insufficient documentation

## 2023-07-10 DIAGNOSIS — I972 Postmastectomy lymphedema syndrome: Secondary | ICD-10-CM | POA: Insufficient documentation

## 2023-07-10 NOTE — Progress Notes (Signed)
Vanessa Medina called asking about counseling.   Referral entered for SW.  She also stated she is having some problems with lymphedema.   She will see Marisue Humble tomorrow for an assessment.

## 2023-07-10 NOTE — Therapy (Signed)
OUTPATIENT PHYSICAL THERAPY SHOULDER TREATMENT NOTE   Patient Name: Vanessa Medina MRN: [id number] DOB:08-Jan-1971, 52 y.o., female Today's Date: 07/10/2023  END OF SESSION:  PT End of Session - 07/10/23 1449     Visit Number 4    Number of Visits 24    Date for PT Re-Evaluation 09/19/23    PT Start Time 1445    PT Stop Time 1527    PT Time Calculation (min) 42 min    Activity Tolerance Patient tolerated treatment well;Patient limited by pain    Behavior During Therapy [facility] for tasks assessed/performed               Past Medical History:  Diagnosis Date   Anemia    Anxiety    Breast cancer (HCC) 01/18/2023   left breast   Cervical cancer (HCC)    Chronic kidney disease    Previous now clear   Claustrophobia    Depression    Family history of adverse reaction to anesthesia     " my mother takes a long time time wake up"   History of dysplastic nevus 01/18/2018   right shoulder, recurrent dysplastic nevus   History of kidney stones    History of shingles    Migraine    Multiple allergies    Obesity    Osteoarthritis    left hip   Pneumonia    PONV (postoperative nausea and vomiting)    slow to wake up   Sleep apnea    does not wear CPAP   Slow to wake up after anesthesia    Stroke (HCC) 08/11/2020   spinal cord stroke, spinal cord puncture during surgery   Wears glasses    Past Surgical History:  Procedure Laterality Date   ABDOMINAL HYSTERECTOMY     APPENDECTOMY     BACK SURGERY     BILIOPANCREATIC DIVISION W DUODENAL SWITCH N/A    BREAST BIOPSY Left 12/06/2022   u/s bx,1:00 heart clip, path pending   BREAST BIOPSY Left 12/06/2022   [name] bx 2:00 ribbon clip path pending   BREAST BIOPSY Left 12/06/2022   [name] LT BREAST BX W LOC DEV EA ADD LESION IMG BX SPEC US GUIDE 12/06/2022 [facility]   BREAST BIOPSY Left 12/06/2022   [name] LT BREAST BX W LOC DEV 1ST LESION IMG BX SPEC US GUIDE 12/06/2022 [facility]   BREAST RECONSTRUCTION WITH PLACEMENT OF  TISSUE EXPANDER AND FLEX HD (ACELLULAR HYDRATED DERMIS) Bilateral 01/18/2023   Procedure: IMMEDIATE LEFT BREAST RECONSTRUCTION WITH PLACEMENT OF TISSUE EXPANDER AND FLEX HD (ACELLULAR HYDRATED DERMIS);  Surgeon: Peggye Form, DO;  Location: [facility];  Service: Plastics;  Laterality: Bilateral;   BREAST RECONSTRUCTION WITH PLACEMENT OF TISSUE EXPANDER AND FLEX HD (ACELLULAR HYDRATED DERMIS) Left 03/26/2023   Procedure: BREAST RECONSTRUCTION WITH PLACEMENT OF TISSUE EXPANDER;  Surgeon: Peggye Form, DO;  Location: [facility];  Service: Plastics;  Laterality: Left;   CHOLECYSTECTOMY     COLONOSCOPY N/A 06/27/2021   Procedure: COLONOSCOPY;  Surgeon: Jaynie Collins, DO;  Location: [facility];  Service: Gastroenterology;  Laterality: N/A;   DIAGNOSTIC LAPAROSCOPY     LOA   ESOPHAGOGASTRODUODENOSCOPY N/A 06/27/2021   Procedure: ESOPHAGOGASTRODUODENOSCOPY (EGD);  Surgeon: Jaynie Collins, DO;  Location: [facility]COPY;  Service: Gastroenterology;  Laterality: N/A;   ESOPHAGOGASTRODUODENOSCOPY (EGD) WITH PROPOFOL N/A 07/25/2017   Procedure: ESOPHAGOGASTRODUODENOSCOPY (EGD) WITH PROPOFOL;  Surgeon: Scot Jun, MD;  Location: [facility] END[facility]COPY;  Service: Endoscopy;  Laterality: N/A;  KNEE ARTHROSCOPY     LAPAROSCOPIC GASTRIC SLEEVE RESECTION WITH HIATAL HERNIA REPAIR     LUMBAR FUSION  08/29/2022   Revision Lumbar two through five Laminectomy & Fusion with Transforaminal Lumbar interbody fusion   MASTECTOMY W/ SENTINEL NODE BIOPSY Bilateral 01/18/2023   Procedure: MASTECTOMY WITH SENTINEL LYMPH NODE BIOPSY, RNFA to assist;  Surgeon: Leafy Ro, MD;  Location: [facility];  Service: General;  Laterality: Bilateral;   REMOVAL OF TISSUE EXPANDER AND PLACEMENT OF IMPLANT Bilateral 06/06/2023   Procedure: REMOVAL OF TISSUE EXPANDER AND PLACEMENT OF IMPLANT;  Surgeon: Peggye Form, DO;  Location: [facility];  Service: Plastics;  Laterality:  Bilateral;   TONSILLECTOMY     TOTAL HIP ARTHROPLASTY Left 01/26/2016   Procedure: LEFT TOTAL HIP ARTHROPLASTY ANTERIOR APPROACH;  Surgeon: Tarry Kos, MD;  Location: [facility];  Service: Orthopedics;  Laterality: Left;   TRANSFORAMINAL LUMBAR INTERBODY FUSION (TLIF) WITH PEDICLE SCREW FIXATION 3 LEVEL  08/2019   Vira Browns, MD L2-L5   Patient Active Problem List   Diagnosis Date Noted   Long-term current use of tamoxifen 06/26/2023   Claustrophobia    Pernicious anemia 03/14/2023   Ruptured left breast implant 02/13/2023   S/P mastectomy, bilateral 01/18/2023   Genetic testing 12/18/2022   Breast cancer (HCC) 12/08/2022   Family history of breast cancer 12/08/2022   Profound fatigue 12/08/2022   Lumbar post-laminectomy syndrome 06/02/2022   Pseudarthrosis after fusion or arthrodesis 05/19/2022   Primary osteoarthritis of right knee 03/28/2022   Subjective tinnitus of both ears 12/21/2021   Abnormality of gait 04/29/2021   Abnormal sensation in both ears 04/06/2021   Other adverse food reactions, not elsewhere classified, subsequent encounter 04/06/2021   Multiple drug allergies 04/06/2021   Insomnia due to medical condition 02/25/2021   Myofascial muscle pain 02/25/2021   Nerve pain 10/18/2020   Incomplete paraplegia (HCC) 10/18/2020   Orthostatic hypotension 10/18/2020   Renal cyst 09/23/2020   Chronic migraine without aura without status migrainosus, not intractable    Hypokalemia    Adjustment reaction with anxiety and depression    Spinal cord injury, lumbar, without spinal bone injury, sequela (HCC) 08/18/2020   Paraparesis (HCC)    Post-operative pain    Hypotension    Slow transit constipation    AKI (acute kidney injury) (HCC)    Right leg weakness 08/11/2020   DDD (degenerative disc disease), lumbar 09/02/2019    Class: Chronic   Degenerative disc disease, lumbar 09/02/2019   Chest tightness 09/23/2018   Bradycardia 09/23/2018   BMI 40.0-44.9, adult (HCC)  04/17/2018   Status post bariatric surgery 04/17/2018   Labile blood pressure 03/08/2018   Chronic low back pain 09/03/2017   History of total hip replacement, left 01/26/2016   Osteoarthritis of spine with radiculopathy, lumbar region 10/16/2014   EDEMA 04/28/2008   LEG PAIN, BILATERAL 12/16/2007   Malignant neoplasm of cervix uteri (HCC) 07/02/2007   MORBID OBESITY 07/02/2007   DEPRESSION 07/02/2007   Migraine without aura 07/02/2007   Other allergic rhinitis 07/02/2007   Asthma 07/02/2007   GERD 07/02/2007   ELEVATED BLOOD PRESSURE WITHOUT DIAGNOSIS OF HYPERTENSION 07/02/2007   Asthma 07/02/2007    PCP: [name] MD  REFERRING PROVIDER: Caroline More  REFERRING DIAG: s/p breast reconstruction  THERAPY DIAG:  Muscle weakness (generalized)  Stiffness of left shoulder, not elsewhere classified  Left shoulder pain, unspecified chronicity  Difficulty in walking, not elsewhere classified  Stiffness of right shoulder, not elsewhere classified  Rationale for Evaluation and Treatment: Rehabilitation  ONSET DATE: December 07 2022  SUBJECTIVE:                                                                                                                                                                                      SUBJECTIVE STATEMENT: Pt reports hanging in there. States she was able to walk without a device today. Agreeable to focusing on her shoulder today.         Hand dominance: Right  PERTINENT HISTORY: Patient is having LUE ROM issues s/p breast cancer implant reconstruction. Patient underwent bilateral exchange of tissue expanders for implants, bilateral capsulotomies for implant repositioning and right breast capsulectomy with major repositioning of 3 x 8 cm of capsule on 06/06/2023. Patient had back surgery December 26th 2023 and then was diagnosed with breast cancer December 07, 2022. Had surgery Jan 18, 2023 for her breast cancer, had double mastectomy.  Patient had two sets of expanders, first one burst, the second one kept flipping and moving. Had to have more work on L side. PMH includes: anemia, anxiety, breast cancer, CKD, claustrophobia, depression, migraines, OA, PONV, sleep apnea, stroke, lumbar fusion, mastectomy.   PAIN:  Are you having pain? Yes: NPRS scale: 4/10 Pain location: L shoulder and breast Pain description: post surgical Aggravating factors: lift something  Relieving factors: none Worst: 7/10 Least: 2/10    PRECAUTIONS: Other: no lifting greater than 1/2 gallon   RED FLAGS: None   WEIGHT BEARING RESTRICTIONS:  no more than 1/2 gallon  FALLS:  Has patient fallen in last 6 months? Yes. Number of falls 1  LIVING ENVIRONMENT: Lives with: lives with their family Lives in: House/apartment Stairs: Yes: External: 2 steps; on right going up and on left going up Has following equipment at home: Single point cane, Walker - 2 wheeled, shower chair, and Grab bars  OCCUPATION: Disability   PLOF: Independent with basic ADLs  PATIENT GOALS:to decrease pain in L shoulder   NEXT MD VISIT:   OBJECTIVE:  Note: Objective measures were completed at Evaluation unless otherwise noted.  DIAGNOSTIC FINDINGS:  N/a   PATIENT SURVEYS:  FOTO 32  COGNITION: Overall cognitive status:  some short term memory      SENSATION: WFL  POSTURE: Rounding shoulders,   UPPER EXTREMITY ROM:   Active ROM Right eval Left eval  Shoulder flexion 115* 90  Shoulder extension 48 41  Shoulder abduction 81* 89  Shoulder adduction    Shoulder internal rotation    Shoulder external rotation Unable to test in testing position Unable to test in testing position  (Blank rows = not tested) *  UPPER EXTREMITY  MMT: Deferred at this time due to limited ROM and pain   MMT Right eval Left eval  Shoulder flexion    Shoulder extension    Shoulder abduction    Shoulder adduction    Shoulder internal rotation    Shoulder external  rotation    Middle trapezius    Lower trapezius    Elbow flexion    Elbow extension    Wrist flexion    Wrist extension    Wrist ulnar deviation    Wrist radial deviation    Wrist pronation    Wrist supination    Grip strength (lbs)    (Blank rows = not tested)  SHOULDER SPECIAL TESTS: Deferred due to limited ROM   JOINT MOBILITY TESTING:  GH: hypomobile AP, PA, inferior glide bilateral   PALPATION:  Muscle guarding subscap, pectoral, glenohumeral region  Significant fluid on L flank    TODAY'S TREATMENT:                                                                                                                                         DATE: 07/10/23   TE:  Supine wand AAROM activities:  Shoulder flex 2 sets of 10 reps (verbal and tactile cues on how to perform correctly)  Shoulder abd - 2 sets of 10 reps Shoulder ER- (elbows at 90 deg)  Comments: Cuing to perform all within pain-free range  Manual: On plinth-elevated to approx 45 deg PROM- L shoulder flexion, IR/ER, ABD, and  scaption within pain-free ranges x multiple reps. Pain reported initially with shoulder ABD but improved with reps.   Grade 2-3 inf GH joint mobs- 30 bouts x 5; PA/AP glides each- 30 bouts x 3 Gentle inf distraction (long axis- GH joint) x 20 sec hold x 4.    PATIENT EDUCATION: Education details: exercise technique, HEP, decrease ROM with all HEP interventions as necessary to improve comfort and perform pain-free Person educated: Patient Education method: Explanation, Demonstration, Tactile cues, Verbal cues, and Handouts Education comprehension: verbalized understanding, returned demonstration, verbal cues required, and tactile cues required  HOME EXERCISE PROGRAM: Access Code: [email or web address] URL: [email or web address] Date: 07/02/2023 Prepared by: Temple Pacini  Exercises - Seated Shoulder Shrugs  - 2 x daily - 6-7 x weekly - 3 sets - 10 reps - Seated Shoulder Shrug Circles  AROM Backward  - 2 x daily - 6-7 x weekly - 3 sets - 10 reps - Seated Scapular Retraction  - 1 x daily - 6-7 x weekly - 3 sets - 10 reps  ASSESSMENT:  CLINICAL IMPRESSION: Treatment concentrated on Passive shoulder ROM to focus on improving overall shoulder ROM while as pain free as possible. Added some wand exercises to home program - will issue handout next session. Patient responded well to all PROM and instruction in stretching today. Patient will benefit from skilled physical therapy to improve ROM and strength and decrease  pain for improved quality of life.     OBJECTIVE IMPAIRMENTS: Abnormal gait, decreased endurance, decreased mobility, decreased ROM, decreased strength, hypomobility, increased edema, increased fascial restrictions, impaired perceived functional ability, increased muscle spasms, impaired flexibility, impaired UE functional use, improper body mechanics, postural dysfunction, and pain.   ACTIVITY LIMITATIONS: carrying, lifting, bed mobility, bathing, toileting, dressing, self feeding, reach over head, hygiene/grooming, and caring for others  PARTICIPATION LIMITATIONS: meal prep, cleaning, laundry, medication management, shopping, community activity, and yard work  PERSONAL FACTORS: Age, Fitness, Past/current experiences, Time since onset of injury/illness/exacerbation, and 3+ comorbidities: anemia, anxiety, breast cancer, CKD, claustrophobia, depression, migraines, OA, PONV, sleep apnea, stroke, lumbar fusion, mastectomy.   are also affecting patient's functional outcome.   REHAB POTENTIAL: Good  CLINICAL DECISION MAKING: Evolving/moderate complexity  EVALUATION COMPLEXITY: Moderate   GOALS: Goals reviewed with patient? Yes  SHORT TERM GOALS: Target date: 07/25/2023    Patient will be independent in home exercise program to improve strength/mobility for better functional independence with ADLs. Baseline: 10/23: give next session  Goal status:  INITIAL    LONG TERM GOALS: Target date: 09/19/2023    Patient will increase FOTO score to equal to or greater than 49    to demonstrate statistically significant improvement in mobility and quality of life.  Baseline: 32 Goal status: INITIAL  2.  Patient will report a worst pain of 3/10 on VAS in L shoulder  to improve tolerance with ADLs and reduced symptoms with activities.  Baseline: 7/10  Goal status: INITIAL  3.  Patient will improve shoulder AROM to > 140 degrees of flexion, scaption, and abduction for improved ability to perform overhead activities. Baseline: see above Goal status: INITIAL  4.  Patient will improve shoulder strength to 4/5 for improved functional ADL and iADL performance Baseline: 19/14: unable to perform today due to pain and ROM limitations Goal status: INITIAL  5. Patient will increase six minute walk test distance to >1000 for progression to community ambulator and improve gait ability  Baseline: 652 ft with SPC  Goal status: INITIAL   PLAN:  PT FREQUENCY: 2x/week  PT DURATION: 12 weeks  PLANNED INTERVENTIONS: 97164- PT Re-evaluation, 97110-Therapeutic exercises, 97530- Therapeutic activity, 97112- Neuromuscular re-education, 97535- Self Care, [id number]- Manual therapy, 97116- Gait training, 97014- Electrical stimulation (unattended), [phone]- Electrical stimulation (manual), [id number]- Traction (mechanical), Patient/Family education, Balance training, Taping, Joint mobilization, Spinal mobilization, Manual lymph drainage, Scar mobilization, Compression bandaging, Vestibular training, Visual/preceptual remediation/compensation, Cognitive remediation, Cryotherapy, Moist heat, and Biofeedback  PLAN FOR NEXT SESSION:  swelling reduction strategies, ROM Of shoulder, nustep, progress HEP as pt able   Lenda Kelp, PT 07/10/2023, 3:51 PM

## 2023-07-11 ENCOUNTER — Ambulatory Visit (INDEPENDENT_AMBULATORY_CARE_PROVIDER_SITE_OTHER): Payer: Medicaid Other | Admitting: Student

## 2023-07-11 ENCOUNTER — Encounter: Payer: Medicaid Other | Admitting: Student

## 2023-07-11 ENCOUNTER — Encounter: Payer: Self-pay | Admitting: Occupational Therapy

## 2023-07-11 ENCOUNTER — Ambulatory Visit: Payer: Medicaid Other | Admitting: Occupational Therapy

## 2023-07-11 ENCOUNTER — Telehealth: Payer: Self-pay

## 2023-07-11 DIAGNOSIS — Z9889 Other specified postprocedural states: Secondary | ICD-10-CM

## 2023-07-11 DIAGNOSIS — I890 Lymphedema, not elsewhere classified: Secondary | ICD-10-CM

## 2023-07-11 DIAGNOSIS — M6281 Muscle weakness (generalized): Secondary | ICD-10-CM | POA: Diagnosis not present

## 2023-07-11 DIAGNOSIS — I972 Postmastectomy lymphedema syndrome: Secondary | ICD-10-CM

## 2023-07-11 DIAGNOSIS — L905 Scar conditions and fibrosis of skin: Secondary | ICD-10-CM

## 2023-07-11 NOTE — Therapy (Deleted)
[facility] - A Dept Of [place] [address] [place], [place], [address] Phone: [phone]   Fax:  [phone]  Occupational Therapy Evaluation  Patient Details  Name: SENDY PLUTA MRN: [id number] Date of Birth: 06-27-1971 No data recorded  Encounter Date: 07/11/2023   OT End of Session - 07/11/23 1054     Visit Number 3    Number of Visits 12    Date for OT Re-Evaluation 10/03/23    OT Start Time 0830    OT Stop Time 0901    OT Time Calculation (min) 31 min    Activity Tolerance Patient tolerated treatment well    Behavior During Therapy [facility] for tasks assessed/performed             Past Medical History:  Diagnosis Date   Anemia    Anxiety    Breast cancer (HCC) 01/18/2023   left breast   Cervical cancer (HCC)    Chronic kidney disease    Previous now clear   Claustrophobia    Depression    Family history of adverse reaction to anesthesia     " my mother takes a long time time wake up"   History of dysplastic nevus 01/18/2018   right shoulder, recurrent dysplastic nevus   History of kidney stones    History of shingles    Migraine    Multiple allergies    Obesity    Osteoarthritis    left hip   Pneumonia    PONV (postoperative nausea and vomiting)    slow to wake up   Sleep apnea    does not wear CPAP   Slow to wake up after anesthesia    Stroke (HCC) 08/11/2020   spinal cord stroke, spinal cord puncture during surgery   Wears glasses     Past Surgical History:  Procedure Laterality Date   ABDOMINAL HYSTERECTOMY     APPENDECTOMY     BACK SURGERY     BILIOPANCREATIC DIVISION W DUODENAL SWITCH N/A    BREAST BIOPSY Left 12/06/2022   u/s bx,1:00 heart clip, path pending   BREAST BIOPSY Left 12/06/2022   [name] bx 2:00 ribbon clip path pending   BREAST BIOPSY Left 12/06/2022   [name] LT BREAST BX W LOC DEV EA ADD LESION IMG BX SPEC US GUIDE 12/06/2022 [facility]   BREAST BIOPSY  Left 12/06/2022   [name] LT BREAST BX W LOC DEV 1ST LESION IMG BX SPEC US GUIDE 12/06/2022 [facility]   BREAST RECONSTRUCTION WITH PLACEMENT OF TISSUE EXPANDER AND FLEX HD (ACELLULAR HYDRATED DERMIS) Bilateral 01/18/2023   Procedure: IMMEDIATE LEFT BREAST RECONSTRUCTION WITH PLACEMENT OF TISSUE EXPANDER AND FLEX HD (ACELLULAR HYDRATED DERMIS);  Surgeon: Peggye Form, DO;  Location: [facility];  Service: Plastics;  Laterality: Bilateral;   BREAST RECONSTRUCTION WITH PLACEMENT OF TISSUE EXPANDER AND FLEX HD (ACELLULAR HYDRATED DERMIS) Left 03/26/2023   Procedure: BREAST RECONSTRUCTION WITH PLACEMENT OF TISSUE EXPANDER;  Surgeon: Peggye Form, DO;  Location: [facility];  Service: Plastics;  Laterality: Left;   CHOLECYSTECTOMY     COLONOSCOPY N/A 06/27/2021   Procedure: COLONOSCOPY;  Surgeon: Jaynie Collins, DO;  Location: [facility];  Service: Gastroenterology;  Laterality: N/A;   DIAGNOSTIC LAPAROSCOPY     LOA   ESOPHAGOGASTRODUODENOSCOPY N/A 06/27/2021   Procedure: ESOPHAGOGASTRODUODENOSCOPY (EGD);  Surgeon: Jaynie Collins, DO;  Location: [facility]OSCOPY;  Service: Gastroenterology;  Laterality: N/A;   ESOPHAGOGASTRODUODENOSCOPY (EGD) WITH  PROPOFOL N/A 07/25/2017   Procedure: ESOPHAGOGASTRODUODENOSCOPY (EGD) WITH PROPOFOL;  Surgeon: Scot Jun, MD;  Location: [facility];  Service: Endoscopy;  Laterality: N/A;   KNEE ARTHROSCOPY     LAPAROSCOPIC GASTRIC SLEEVE RESECTION WITH HIATAL HERNIA REPAIR     LUMBAR FUSION  08/29/2022   Revision Lumbar two through five Laminectomy & Fusion with Transforaminal Lumbar interbody fusion   MASTECTOMY W/ SENTINEL NODE BIOPSY Bilateral 01/18/2023   Procedure: MASTECTOMY WITH SENTINEL LYMPH NODE BIOPSY, RNFA to assist;  Surgeon: Leafy Ro, MD;  Location: [facility];  Service: General;  Laterality: Bilateral;   REMOVAL OF TISSUE EXPANDER AND PLACEMENT OF IMPLANT Bilateral 06/06/2023   Procedure: REMOVAL OF  TISSUE EXPANDER AND PLACEMENT OF IMPLANT;  Surgeon: Peggye Form, DO;  Location: [facility];  Service: Plastics;  Laterality: Bilateral;   TONSILLECTOMY     TOTAL HIP ARTHROPLASTY Left 01/26/2016   Procedure: LEFT TOTAL HIP ARTHROPLASTY ANTERIOR APPROACH;  Surgeon: Tarry Kos, MD;  [facility];  Service: Orthopedics;  Laterality: Left;   TRANSFORAMINAL LUMBAR INTERBODY FUSION (TLIF) WITH PEDICLE SCREW FIXATION 3 LEVEL  08/2019   Vira Browns, MD L2-L5    There were no vitals filed for this visit.   Subjective Assessment - 07/11/23 1057     Subjective  Since I seen you in June - I had so much trouble with this L left expander and implant- and my range motion is not good- seeing PT downstairs where I seen them for my back - I have more swelling under my arm and in L  breast - so I called Ana to get back in with you again    Pertinent History ASSESSMENT & PLAN:   LYNLEE STRATTON 52 y.o. female with pmh of Right-sided hemiparesis as complication from permanent spinal stimulator placement, orthostasis on midodrine and fludrocortisone, hypertension, migraine, GERD, cholecystectomy, lumbar fusion in December 2023, gastric sleeve was referred to medical oncology for newly diagnosed left breast hormone positive cancer.     # Left breast IDC, multifocal, ER/PR positive, HER2 negative  -The mass was detected on screening mammogram.  Imaging showing 3 masses. Mass 1 at 1:00 3 cm from the nipple measuring 7 x by 8 x 8 mm.  Mass 2 1:30 o'clock 1 cm from nipple measuring 9 x 7 x 9 mm.  Mass 3 at 2:00 1 cm from nipple measuring 6 x 8 x 4 mm.  No suspicious left axillary lymph node 1.  Mass 1 and mass 3 was biopsied consistent with invasive ductal cancer.  Both are strongly expressing ER and PR receptors.  HER2 negative.     -Status post bilateral mastectomies with SLNB left axilla and immediate bilateral reconstruction on 01/18/2023.  Pathology showed multifocal disease 3 separate foci- 12  mm with grade 1, 12 mm with grade 2 and 9 mm with grade 2 invasive mucinous. margins negative, 1/2 LN micrometastatic dz, 0.6 mm deposit, ER/PR >90% positive. HER2 negative.   -Oncotype DX score was 9.  No role for adjuvant chemotherapy.  -No role for adjuvant radiation.  -Patient decided to proceed with tamoxifen over aromatase inhibitor due to a side effect of osteoporosis.  Started on tamoxifen on 03/02/2023.  Overall has been tolerating well.  Myberitq has been switched to tolterodine due to interaction with tamoxifen.  She is on duloxetine also which is showing to decrease the efficacy of tamoxifen.  I will talk to the patient about it.  -She had multiple issues  with tissue expanders.  Now has implant placed.  Follows with Dr. Ulice Bold.     # Right apical nodule  -Detected on breast cancer staging.  CT chest without contrast showed 4 mm right apical nodule.    -Repeat CT scan showed stable right apical nodule likely benign calcified granuloma.  No further follow-up is recommended.    Patient Stated Goals Want to prevent lymphedema or see if you can help with swelling    Currently in Pain? Yes    Pain Score 7     Pain Location Breast    Pain Orientation Left   lateral breast , axilla and medial upper arm                LYMPHEDEMA/ONCOLOGY QUESTIONNAIRE - 07/11/23 0001       Right Upper Extremity Lymphedema   15 cm Proximal to Olecranon Process 42.5 cm    10 cm Proximal to Olecranon Process 38.5 cm    Olecranon Process 29.5 cm    15 cm Proximal to Ulnar Styloid Process 29 cm    10 cm Proximal to Ulnar Styloid Process 24.5 cm    Just Proximal to Ulnar Styloid Process 17.4 cm      Left Upper Extremity Lymphedema   15 cm Proximal to Olecranon Process 44 cm    10 cm Proximal to Olecranon Process 40.5 cm    Olecranon Process 30 cm    15 cm Proximal to Ulnar Styloid Process 29 cm    10 cm Proximal to Ulnar Styloid Process 25 cm    Just Proximal to Ulnar Styloid Process 17.5 cm                                  OT Long Term Goals - 02/21/23 1832       OT LONG TERM GOAL #1   Title Patient to be independent in home program to increase active range of motion in bilateral shoulders with functional limb at with pain less than a 2/10 pain    Baseline Pain and tenderness in left chest and sternum 6/10 increasing with overhead shoulder flexion and abduction.  Limited severely in flexion and abduction on the left more than the right.    Time 6    Period Weeks    Status New    Target Date 04/04/23      OT LONG TERM GOAL #2   Title Monitor and prevent lymphedema in bilateral upper extremities and thoracic and assess if needed compression.    Baseline Patient is right-hand dominant.  Do show increased swelling in the left upper arm as well as chest but has a ruptured expander at the moment.    Time 6    Period Weeks    Status New    Target Date 04/04/23      OT LONG TERM GOAL #3   Title Bilateral shoulder strength improved for patient to pull overhead shirt and reach overhead with 2 pound weight independently    Baseline Patient limited severely in shoulder and shoulder range of motion with the left limited to 95 -115 degrees with pain 6/10 over left chest    Time 12    Period Weeks    Status New    Target Date 05/16/23      OT LONG TERM GOAL #4   Title Bilateral shoulder external rotation for patient to be able to wash and do hair  with no increase symptoms.    Baseline Unable to do external rotation doing hair.  Abduction to 95 degrees on the left-pain 6 4/10    Time 12    Period Weeks    Status New    Target Date 05/16/23                    Patient will benefit from skilled therapeutic intervention in order to improve the following deficits and impairments:           Visit Diagnosis: Post-mastectomy lymphedema syndrome    Problem List Patient Active Problem List   Diagnosis Date Noted   Long-term current use of tamoxifen  06/26/2023   Claustrophobia    Pernicious anemia 03/14/2023   Ruptured left breast implant 02/13/2023   S/P mastectomy, bilateral 01/18/2023   Genetic testing 12/18/2022   Breast cancer (HCC) 12/08/2022   Family history of breast cancer 12/08/2022   Profound fatigue 12/08/2022   Lumbar post-laminectomy syndrome 06/02/2022   Pseudarthrosis after fusion or arthrodesis 05/19/2022   Primary osteoarthritis of right knee 03/28/2022   Subjective tinnitus of both ears 12/21/2021   Abnormality of gait 04/29/2021   Abnormal sensation in both ears 04/06/2021   Other adverse food reactions, not elsewhere classified, subsequent encounter 04/06/2021   Multiple drug allergies 04/06/2021   Insomnia due to medical condition 02/25/2021   Myofascial muscle pain 02/25/2021   Nerve pain 10/18/2020   Incomplete paraplegia (HCC) 10/18/2020   Orthostatic hypotension 10/18/2020   Renal cyst 09/23/2020   Chronic migraine without aura without status migrainosus, not intractable    Hypokalemia    Adjustment reaction with anxiety and depression    Spinal cord injury, lumbar, without spinal bone injury, sequela (HCC) 08/18/2020   Paraparesis (HCC)    Post-operative pain    Hypotension    Slow transit constipation    AKI (acute kidney injury) (HCC)    Right leg weakness 08/11/2020   DDD (degenerative disc disease), lumbar 09/02/2019    Class: Chronic   Degenerative disc disease, lumbar 09/02/2019   Chest tightness 09/23/2018   Bradycardia 09/23/2018   BMI 40.0-44.9, adult (HCC) 04/17/2018   Status post bariatric surgery 04/17/2018   Labile blood pressure 03/08/2018   Chronic low back pain 09/03/2017   History of total hip replacement, left 01/26/2016   Osteoarthritis of spine with radiculopathy, lumbar region 10/16/2014   EDEMA 04/28/2008   LEG PAIN, BILATERAL 12/16/2007   Malignant neoplasm of cervix uteri (HCC) 07/02/2007   MORBID OBESITY 07/02/2007   DEPRESSION 07/02/2007   Migraine without aura  07/02/2007   Other allergic rhinitis 07/02/2007   Asthma 07/02/2007   GERD 07/02/2007   ELEVATED BLOOD PRESSURE WITHOUT DIAGNOSIS OF HYPERTENSION 07/02/2007   Asthma 07/02/2007    Oletta Cohn, OT 07/11/2023, 12:07 PM  [facility] - A Dept Of [place]. [facility] [address] [place], [place], [address] Phone: [phone]   Fax:  [phone]  Name: BASIL BLAKESLEY MRN: [id number] Date of Birth: [birth year]

## 2023-07-11 NOTE — Therapy (Addendum)
[facility] [address] [place], [place], [address] Phone: [phone]   Fax:  [phone]  Occupational Therapy Re-Evaluation  Patient Details  Name: Vanessa Medina MRN: [id number] Date of Birth: Dec 24, 1970 No data recorded  Encounter Date: 07/11/2023   OT End of Session - 07/11/23 1808     Visit Number 3    Number of Visits 12    Date for OT Re-Evaluation 10/03/23    OT Start Time 0830    OT Stop Time 0901    OT Time Calculation (min) 31 min    Activity Tolerance Patient tolerated treatment well             Past Medical History:  Diagnosis Date   Anemia    Anxiety    Breast cancer (HCC) 01/18/2023   left breast   Cervical cancer (HCC)    Chronic kidney disease    Previous now clear   Claustrophobia    Depression    Family history of adverse reaction to anesthesia     " my mother takes a long time time wake up"   History of dysplastic nevus 01/18/2018   right shoulder, recurrent dysplastic nevus   History of kidney stones    History of shingles    Migraine    Multiple allergies    Obesity    Osteoarthritis    left hip   Pneumonia    PONV (postoperative nausea and vomiting)    slow to wake up   Sleep apnea    does not wear CPAP   Slow to wake up after anesthesia    Stroke (HCC) 08/11/2020   spinal cord stroke, spinal cord puncture during surgery   Wears glasses     Past Surgical History:  Procedure Laterality Date   ABDOMINAL HYSTERECTOMY     APPENDECTOMY     BACK SURGERY     BILIOPANCREATIC DIVISION W DUODENAL SWITCH N/A    BREAST BIOPSY Left 12/06/2022   u/s bx,1:00 heart clip, path pending   BREAST BIOPSY Left 12/06/2022   [name] bx 2:00 ribbon clip path pending   BREAST BIOPSY Left 12/06/2022   [name] LT BREAST BX W LOC DEV EA ADD LESION IMG BX SPEC US GUIDE 12/06/2022 [facility]   BREAST BIOPSY Left 12/06/2022   [name] LT BREAST BX W LOC DEV 1ST LESION IMG BX SPEC US GUIDE 12/06/2022  [facility]   BREAST RECONSTRUCTION WITH PLACEMENT OF TISSUE EXPANDER AND FLEX HD (ACELLULAR HYDRATED DERMIS) Bilateral 01/18/2023   Procedure: IMMEDIATE LEFT BREAST RECONSTRUCTION WITH PLACEMENT OF TISSUE EXPANDER AND FLEX HD (ACELLULAR HYDRATED DERMIS);  Surgeon: Peggye Form, DO;  Location: [facility];  Service: Plastics;  Laterality: Bilateral;   BREAST RECONSTRUCTION WITH PLACEMENT OF TISSUE EXPANDER AND FLEX HD (ACELLULAR HYDRATED DERMIS) Left 03/26/2023   Procedure: BREAST RECONSTRUCTION WITH PLACEMENT OF TISSUE EXPANDER;  Surgeon: Peggye Form, DO;  Location: [facility];  Service: Plastics;  Laterality: Left;   CHOLECYSTECTOMY     COLONOSCOPY N/A 06/27/2021   Procedure: COLONOSCOPY;  Surgeon: Jaynie Collins, DO;  Location: [facility];  Service: Gastroenterology;  Laterality: N/A;   DIAGNOSTIC LAPAROSCOPY     LOA   ESOPHAGOGASTRODUODENOSCOPY N/A 06/27/2021   Procedure: ESOPHAGOGASTRODUODENOSCOPY (EGD);  Surgeon: Jaynie Collins, DO;  Location: [facility];  Service: Gastroenterology;  Laterality: N/A;   ESOPHAGOGASTRODUODENOSCOPY (EGD) WITH PROPOFOL N/A 07/25/2017   Procedure: ESOPHAGOGASTRODUODENOSCOPY (EGD) WITH PROPOFOL;  Surgeon: Scot Jun, MD;  Location:  [facility];  Service: Endoscopy;  Laterality: N/A;   KNEE ARTHROSCOPY     LAPAROSCOPIC GASTRIC SLEEVE RESECTION WITH HIATAL HERNIA REPAIR     LUMBAR FUSION  08/29/2022   Revision Lumbar two through five Laminectomy & Fusion with Transforaminal Lumbar interbody fusion   MASTECTOMY W/ SENTINEL NODE BIOPSY Bilateral 01/18/2023   Procedure: MASTECTOMY WITH SENTINEL LYMPH NODE BIOPSY, RNFA to assist;  Surgeon: Leafy Ro, MD;  Location: [facility];  Service: General;  Laterality: Bilateral;   REMOVAL OF TISSUE EXPANDER AND PLACEMENT OF IMPLANT Bilateral 06/06/2023   Procedure: REMOVAL OF TISSUE EXPANDER AND PLACEMENT OF IMPLANT;  Surgeon: Peggye Form, DO;  Location:  [facility];  Service: Plastics;  Laterality: Bilateral;   TONSILLECTOMY     TOTAL HIP ARTHROPLASTY Left 01/26/2016   Procedure: LEFT TOTAL HIP ARTHROPLASTY ANTERIOR APPROACH;  Surgeon: Tarry Kos, MD;  [facility];  Service: Orthopedics;  Laterality: Left;   TRANSFORAMINAL LUMBAR INTERBODY FUSION (TLIF) WITH PEDICLE SCREW FIXATION 3 LEVEL  08/2019   Vira Browns, MD L2-L5    There were no vitals filed for this visit.   Subjective Assessment - 07/11/23 1810     Subjective  Since I seen you in June - I had so much trouble with this L left expander and implant- and my range motion is not good- seeing PT downstairs where I seen them for my back - I have more swelling under my arm and in L  breast - so I called Ana to get back in with you again    Patient Stated Goals Want to prevent lymphedema or see if you can help with swelling    Currently in Pain? Yes    Pain Score 7     Pain Location Breast    Pain Orientation Left    Pain Descriptors / Indicators Tender    Pain Type Surgical pain                 LYMPHEDEMA/ONCOLOGY QUESTIONNAIRE - 07/11/23 0001       Right Upper Extremity Lymphedema   15 cm Proximal to Olecranon Process 42.5 cm    10 cm Proximal to Olecranon Process 38.5 cm    Olecranon Process 29.5 cm    15 cm Proximal to Ulnar Styloid Process 29 cm    10 cm Proximal to Ulnar Styloid Process 24.5 cm    Just Proximal to Ulnar Styloid Process 17.4 cm      Left Upper Extremity Lymphedema   15 cm Proximal to Olecranon Process 44 cm    10 cm Proximal to Olecranon Process 40.5 cm    Olecranon Process 30 cm    15 cm Proximal to Ulnar Styloid Process 29 cm    10 cm Proximal to Ulnar Styloid Process 25 cm    Just Proximal to Ulnar Styloid Process 17.5 cm                                 OT Long Term Goals - 07/11/23 1819       OT LONG TERM GOAL #1   Title Patient to be independent in home program to increase active range of  motion in bilateral shoulders with functional limb at with pain less than a 2/10 pain    Baseline Patient followed by physical therapy    Status Deferred      OT LONG TERM GOAL #2  Title Monitor and prevent lymphedema in bilateral upper extremities and thoracic and assess if needed compression.    Baseline Patient is right-hand dominant.  Do show increased swelling in the left upper arm as well as chest but has a ruptured expander at the moment. NOW patient return after being seen in June.  Patient had multiple surgeries to the left breast.  Increased lymphedema left breast, thoracic and upper arm.  Recommend compression with continue to monitor progress with home program    Time 12    Period Weeks    Status On-going    Target Date 10/03/23      OT LONG TERM GOAL #3   Title Bilateral shoulder strength improved for patient to pull overhead shirt and reach overhead with 2 pound weight independently    Baseline Patient get physical therapy    Status Deferred      OT LONG TERM GOAL #4   Title Bilateral shoulder external rotation for patient to be able to wash and do hair with no increase symptoms.    Baseline Patient receiving physical therapy    Status Deferred                   Plan - 07/11/23 1812     Clinical Impression Statement Pt refer to OT after being seen the last time in June 24 -  post bilateral mastectomy with immediate reconstruction using tissue expanders and Flex HD performed on 01/18/2023 by Dr. Ulice Bold and Dr. Everlene Farrier. Breast CA diagnosis on the L.  Pt report had multiple surgeries to L breast since seen OT in June- and in PT for bilateral shoulder stiffness. Patient is right-hand dominant.  Pt report and show increase swelling and lymphedema in lateral L breast, axilla and thorasic - with increase of circumference in L UE - pt can benefit from unilateral post mastectomy breast pad jovipak to use in compression bra on L thoracic -and over the counter compression  sleeve and glove to wear for 4 wks during the day - next follow up in 2 wks - Patient can benefit from skilled OT services to setup pt with homeprogram , appropriate pression garments and monitor to decrease lymphedema, discomfort and pain in left breast, thoracic and upper extremity.    OT Occupational Profile and History Problem Focused Assessment - Including review of records relating to presenting problem    Occupational performance deficits (Please refer to evaluation for details): ADL's;IADL's;Rest and Sleep;Social Participation;Leisure    Body Structure / Function / Physical Skills ADL;Decreased knowledge of precautions;Flexibility;ROM;UE functional use;Scar mobility;IADL;Pain;Skin integrity    Rehab Potential Good    Clinical Decision Making Several treatment options, min-mod task modification necessary    Comorbidities Affecting Occupational Performance: May have comorbidities impacting occupational performance    Modification or Assistance to Complete Evaluation  Min-Moderate modification of tasks or assist with assess necessary to complete eval    OT Frequency Biweekly    OT Duration 12 weeks    OT Treatment/Interventions Self-care/ADL training;Manual lymph drainage;Therapeutic exercise;Patient/family education;DME and/or AE instruction;Manual Therapy;Scar mobilization;Therapeutic activities    Consulted and Agree with Plan of Care Patient             Patient will benefit from skilled therapeutic intervention in order to improve the following deficits and impairments:   Body Structure / Function / Physical Skills: ADL, Decreased knowledge of precautions, Flexibility, ROM, UE functional use, Scar mobility, IADL, Pain, Skin integrity       Visit Diagnosis: Post-mastectomy lymphedema syndrome  Scar tissue    Problem List Patient Active Problem List   Diagnosis Date Noted   Long-term current use of tamoxifen 06/26/2023   Claustrophobia    Pernicious anemia 03/14/2023    Ruptured left breast implant 02/13/2023   S/P mastectomy, bilateral 01/18/2023   Genetic testing 12/18/2022   Breast cancer (HCC) 12/08/2022   Family history of breast cancer 12/08/2022   Profound fatigue 12/08/2022   Lumbar post-laminectomy syndrome 06/02/2022   Pseudarthrosis after fusion or arthrodesis 05/19/2022   Primary osteoarthritis of right knee 03/28/2022   Subjective tinnitus of both ears 12/21/2021   Abnormality of gait 04/29/2021   Abnormal sensation in both ears 04/06/2021   Other adverse food reactions, not elsewhere classified, subsequent encounter 04/06/2021   Multiple drug allergies 04/06/2021   Insomnia due to medical condition 02/25/2021   Myofascial muscle pain 02/25/2021   Nerve pain 10/18/2020   Incomplete paraplegia (HCC) 10/18/2020   Orthostatic hypotension 10/18/2020   Renal cyst 09/23/2020   Chronic migraine without aura without status migrainosus, not intractable    Hypokalemia    Adjustment reaction with anxiety and depression    Spinal cord injury, lumbar, without spinal bone injury, sequela (HCC) 08/18/2020   Paraparesis (HCC)    Post-operative pain    Hypotension    Slow transit constipation    AKI (acute kidney injury) (HCC)    Right leg weakness 08/11/2020   DDD (degenerative disc disease), lumbar 09/02/2019    Class: Chronic   Degenerative disc disease, lumbar 09/02/2019   Chest tightness 09/23/2018   Bradycardia 09/23/2018   BMI 40.0-44.9, adult (HCC) 04/17/2018   Status post bariatric surgery 04/17/2018   Labile blood pressure 03/08/2018   Chronic low back pain 09/03/2017   History of total hip replacement, left 01/26/2016   Osteoarthritis of spine with radiculopathy, lumbar region 10/16/2014   EDEMA 04/28/2008   LEG PAIN, BILATERAL 12/16/2007   Malignant neoplasm of cervix uteri (HCC) 07/02/2007   MORBID OBESITY 07/02/2007   DEPRESSION 07/02/2007   Migraine without aura 07/02/2007   Other allergic rhinitis 07/02/2007   Asthma  07/02/2007   GERD 07/02/2007   ELEVATED BLOOD PRESSURE WITHOUT DIAGNOSIS OF HYPERTENSION 07/02/2007   Asthma 07/02/2007    Oletta Cohn, OTR/L,CLT 07/11/2023, 6:22 PM  [facility] [address] [place], [address] Phone: [phone]   Fax:  [phone]  Name: Vanessa Medina MRN: [id number] Date of Birth: [birth year]

## 2023-07-11 NOTE — Telephone Encounter (Signed)
  Clinical Social Work was referred by medical provider for assessment of psychosocial needs.  CSW attempted to contact patient via interpreter services by phone.  Left voicemail with contact information and request for return call.

## 2023-07-11 NOTE — Progress Notes (Unsigned)
Patient is a 52 year old female who underwent bilateral exchange of tissue expanders for implants, bilateral capsulotomies for implant repositioning and right breast capsulectomy with major repositioning of 3 x 8 cm of capsule on 06/06/2023 Dr. Ulice Bold. Patient had Mentor smooth round ultrahigh profile gel implants 650 cc placed bilaterally.  She is 5 weeks postop.  She presents to the clinic today for postoperative follow-up.  Patient was last seen in the clinic on 06/26/2023.  At this visit, patient was doing well and feeling better.  On exam, right breast implant was in place and was soft.  Left breast implant was in place and soft as well.  There was a small amount of fluid out laterally.  No overlying skin changes.  Referral was sent to physical therapy for limited range of motion.  Today, patient reports she is doing well.  She states she has been going to physical therapy and was given some recommendations for compressive garments by them to help with the fluid to her lateral left breast.  She states that she feels there is still little bit of fluid present, but denies any issues with it.  Reports her range of motion is still limited.  Denies any other issues or concerns today.  Chaperone present on exam.  On exam, patient is sitting upright in no acute distress.  Right breast implant is in place and is soft.  There is no overlying skin changes.  No fluid collections palpated on exam.  Incision is intact and healing well.  There were 2 suture knots noted to the incision, these were cut and removed.  Patient tolerated well.  To the left breast, implant is in place and is soft.  There is no overlying erythema, no fluid noted over the implant itself.  There is a small fluid wave appreciated out laterally near the axilla.  There are no overlying skin changes.  No signs of infection.  Incision to the left breast is healing well.  Discussed with patient that we could send her for another ultrasound to see  if fluid could be aspirated.  Patient stated that she would rather avoid being poked again if possible.  She states that she would like to try some of the compressive garments recommended by physical therapy and continue to monitor.  Will go ahead and order compressive garment through [employer].  I discussed with the patient that she should closely monitor her left breast.  We did discuss the risk of fluid collections and infection in relation to the fluid collection.  Patient expressed understanding.  Recommended the patient continue with compressive garments given that she is still having fluid.  Patient states she already has an appointment scheduled for 2 weeks.  I instructed the patient to call us back if she has any questions or concerns about anything.

## 2023-07-12 ENCOUNTER — Telehealth: Payer: Self-pay | Admitting: *Deleted

## 2023-07-12 ENCOUNTER — Ambulatory Visit: Payer: Medicaid Other

## 2023-07-12 DIAGNOSIS — M6281 Muscle weakness (generalized): Secondary | ICD-10-CM

## 2023-07-12 DIAGNOSIS — M25612 Stiffness of left shoulder, not elsewhere classified: Secondary | ICD-10-CM

## 2023-07-12 DIAGNOSIS — M25512 Pain in left shoulder: Secondary | ICD-10-CM

## 2023-07-12 DIAGNOSIS — R262 Difficulty in walking, not elsewhere classified: Secondary | ICD-10-CM

## 2023-07-12 DIAGNOSIS — M25611 Stiffness of right shoulder, not elsewhere classified: Secondary | ICD-10-CM

## 2023-07-12 NOTE — Therapy (Signed)
OUTPATIENT PHYSICAL THERAPY SHOULDER TREATMENT NOTE   Patient Name: Vanessa Medina MRN: [id number] DOB:March 24, 1971, 52 y.o., female Today's Date: 07/12/2023  END OF SESSION:  PT End of Session - 07/12/23 1447     Visit Number 5    Number of Visits 24    Date for PT Re-Evaluation 09/19/23    Progress Note Due on Visit 10    PT Start Time 1445    PT Stop Time 1524    PT Time Calculation (min) 39 min    Activity Tolerance Patient tolerated treatment well;Patient limited by pain    Behavior During Therapy [facility] for tasks assessed/performed                Past Medical History:  Diagnosis Date   Anemia    Anxiety    Breast cancer (HCC) 01/18/2023   left breast   Cervical cancer (HCC)    Chronic kidney disease    Previous now clear   Claustrophobia    Depression    Family history of adverse reaction to anesthesia     " my mother takes a long time time wake up"   History of dysplastic nevus 01/18/2018   right shoulder, recurrent dysplastic nevus   History of kidney stones    History of shingles    Migraine    Multiple allergies    Obesity    Osteoarthritis    left hip   Pneumonia    PONV (postoperative nausea and vomiting)    slow to wake up   Sleep apnea    does not wear CPAP   Slow to wake up after anesthesia    Stroke (HCC) 08/11/2020   spinal cord stroke, spinal cord puncture during surgery   Wears glasses    Past Surgical History:  Procedure Laterality Date   ABDOMINAL HYSTERECTOMY     APPENDECTOMY     BACK SURGERY     BILIOPANCREATIC DIVISION W DUODENAL SWITCH N/A    BREAST BIOPSY Left 12/06/2022   u/s bx,1:00 heart clip, path pending   BREAST BIOPSY Left 12/06/2022   [name] bx 2:00 ribbon clip path pending   BREAST BIOPSY Left 12/06/2022   [name] LT BREAST BX W LOC DEV EA ADD LESION IMG BX SPEC US GUIDE 12/06/2022 [facility]   BREAST BIOPSY Left 12/06/2022   [name] LT BREAST BX W LOC DEV 1ST LESION IMG BX SPEC US GUIDE 12/06/2022 [facility]    BREAST RECONSTRUCTION WITH PLACEMENT OF TISSUE EXPANDER AND FLEX HD (ACELLULAR HYDRATED DERMIS) Bilateral 01/18/2023   Procedure: IMMEDIATE LEFT BREAST RECONSTRUCTION WITH PLACEMENT OF TISSUE EXPANDER AND FLEX HD (ACELLULAR HYDRATED DERMIS);  Surgeon: Peggye Form, DO;  Location: [facility];  Service: Plastics;  Laterality: Bilateral;   BREAST RECONSTRUCTION WITH PLACEMENT OF TISSUE EXPANDER AND FLEX HD (ACELLULAR HYDRATED DERMIS) Left 03/26/2023   Procedure: BREAST RECONSTRUCTION WITH PLACEMENT OF TISSUE EXPANDER;  Surgeon: Peggye Form, DO;  Location: [facility];  Service: Plastics;  Laterality: Left;   CHOLECYSTECTOMY     COLONOSCOPY N/A 06/27/2021   Procedure: COLONOSCOPY;  Surgeon: Jaynie Collins, DO;  Location: [facility];  Service: Gastroenterology;  Laterality: N/A;   DIAGNOSTIC LAPAROSCOPY     LOA   ESOPHAGOGASTRODUODENOSCOPY N/A 06/27/2021   Procedure: ESOPHAGOGASTRODUODENOSCOPY (EGD);  Surgeon: Jaynie Collins, DO;  Location: [facility];  Service: Gastroenterology;  Laterality: N/A;   ESOPHAGOGASTRODUODENOSCOPY (EGD) WITH PROPOFOL N/A 07/25/2017   Procedure: ESOPHAGOGASTRODUODENOSCOPY (EGD) WITH PROPOFOL;  Surgeon: Scot Jun, MD;  Location: [facility];  Service: Endoscopy;  Laterality: N/A;   KNEE ARTHROSCOPY     LAPAROSCOPIC GASTRIC SLEEVE RESECTION WITH HIATAL HERNIA REPAIR     LUMBAR FUSION  08/29/2022   Revision Lumbar two through five Laminectomy & Fusion with Transforaminal Lumbar interbody fusion   MASTECTOMY W/ SENTINEL NODE BIOPSY Bilateral 01/18/2023   Procedure: MASTECTOMY WITH SENTINEL LYMPH NODE BIOPSY, RNFA to assist;  Surgeon: Leafy Ro, MD;  Location: [facility];  Service: General;  Laterality: Bilateral;   REMOVAL OF TISSUE EXPANDER AND PLACEMENT OF IMPLANT Bilateral 06/06/2023   Procedure: REMOVAL OF TISSUE EXPANDER AND PLACEMENT OF IMPLANT;  Surgeon: Peggye Form, DO;  Location: [facility];  Service: Plastics;  Laterality: Bilateral;   TONSILLECTOMY     TOTAL HIP ARTHROPLASTY Left 01/26/2016   Procedure: LEFT TOTAL HIP ARTHROPLASTY ANTERIOR APPROACH;  Surgeon: Tarry Kos, MD;  Location: [facility];  Service: Orthopedics;  Laterality: Left;   TRANSFORAMINAL LUMBAR INTERBODY FUSION (TLIF) WITH PEDICLE SCREW FIXATION 3 LEVEL  08/2019   Vira Browns, MD L2-L5   Patient Active Problem List   Diagnosis Date Noted   Long-term current use of tamoxifen 06/26/2023   Claustrophobia    Pernicious anemia 03/14/2023   Ruptured left breast implant 02/13/2023   S/P mastectomy, bilateral 01/18/2023   Genetic testing 12/18/2022   Breast cancer (HCC) 12/08/2022   Family history of breast cancer 12/08/2022   Profound fatigue 12/08/2022   Lumbar post-laminectomy syndrome 06/02/2022   Pseudarthrosis after fusion or arthrodesis 05/19/2022   Primary osteoarthritis of right knee 03/28/2022   Subjective tinnitus of both ears 12/21/2021   Abnormality of gait 04/29/2021   Abnormal sensation in both ears 04/06/2021   Other adverse food reactions, not elsewhere classified, subsequent encounter 04/06/2021   Multiple drug allergies 04/06/2021   Insomnia due to medical condition 02/25/2021   Myofascial muscle pain 02/25/2021   Nerve pain 10/18/2020   Incomplete paraplegia (HCC) 10/18/2020   Orthostatic hypotension 10/18/2020   Renal cyst 09/23/2020   Chronic migraine without aura without status migrainosus, not intractable    Hypokalemia    Adjustment reaction with anxiety and depression    Spinal cord injury, lumbar, without spinal bone injury, sequela (HCC) 08/18/2020   Paraparesis (HCC)    Post-operative pain    Hypotension    Slow transit constipation    AKI (acute kidney injury) (HCC)    Right leg weakness 08/11/2020   DDD (degenerative disc disease), lumbar 09/02/2019    Class: Chronic   Degenerative disc disease, lumbar 09/02/2019   Chest tightness 09/23/2018   Bradycardia  09/23/2018   BMI 40.0-44.9, adult (HCC) 04/17/2018   Status post bariatric surgery 04/17/2018   Labile blood pressure 03/08/2018   Chronic low back pain 09/03/2017   History of total hip replacement, left 01/26/2016   Osteoarthritis of spine with radiculopathy, lumbar region 10/16/2014   EDEMA 04/28/2008   LEG PAIN, BILATERAL 12/16/2007   Malignant neoplasm of cervix uteri (HCC) 07/02/2007   MORBID OBESITY 07/02/2007   DEPRESSION 07/02/2007   Migraine without aura 07/02/2007   Other allergic rhinitis 07/02/2007   Asthma 07/02/2007   GERD 07/02/2007   ELEVATED BLOOD PRESSURE WITHOUT DIAGNOSIS OF HYPERTENSION 07/02/2007   Asthma 07/02/2007    PCP: [name] MD  REFERRING PROVIDER: Caroline More  REFERRING DIAG: s/p breast reconstruction  THERAPY DIAG:  Muscle weakness (generalized)  Stiffness of left shoulder, not elsewhere classified  Left shoulder pain, unspecified chronicity  Difficulty in walking,  not elsewhere classified  Stiffness of right shoulder, not elsewhere classified  Rationale for Evaluation and Treatment: Rehabilitation  ONSET DATE: December 07 2022  SUBJECTIVE:                                                                                                                                                                                      SUBJECTIVE STATEMENT: Patient reports lots going on- went to OT and now seeing both PT and OT. Waiting on new sleeve to arrive.           Hand dominance: Right  PERTINENT HISTORY: Patient is having LUE ROM issues s/p breast cancer implant reconstruction. Patient underwent bilateral exchange of tissue expanders for implants, bilateral capsulotomies for implant repositioning and right breast capsulectomy with major repositioning of 3 x 8 cm of capsule on 06/06/2023. Patient had back surgery December 26th 2023 and then was diagnosed with breast cancer December 07, 2022. Had surgery Jan 18, 2023 for her breast cancer,  had double mastectomy. Patient had two sets of expanders, first one burst, the second one kept flipping and moving. Had to have more work on L side. PMH includes: anemia, anxiety, breast cancer, CKD, claustrophobia, depression, migraines, OA, PONV, sleep apnea, stroke, lumbar fusion, mastectomy.   PAIN:  Are you having pain? Yes: NPRS scale: 4/10 Pain location: L shoulder and breast Pain description: post surgical Aggravating factors: lift something  Relieving factors: none Worst: 7/10 Least: 2/10    PRECAUTIONS: Other: no lifting greater than 1/2 gallon   RED FLAGS: None   WEIGHT BEARING RESTRICTIONS:  no more than 1/2 gallon  FALLS:  Has patient fallen in last 6 months? Yes. Number of falls 1  LIVING ENVIRONMENT: Lives with: lives with their family Lives in: House/apartment Stairs: Yes: External: 2 steps; on right going up and on left going up Has following equipment at home: Single point cane, Walker - 2 wheeled, shower chair, and Grab bars  OCCUPATION: Disability   PLOF: Independent with basic ADLs  PATIENT GOALS:to decrease pain in L shoulder   NEXT MD VISIT:   OBJECTIVE:  Note: Objective measures were completed at Evaluation unless otherwise noted.  DIAGNOSTIC FINDINGS:  N/a   PATIENT SURVEYS:  FOTO 32  COGNITION: Overall cognitive status:  some short term memory      SENSATION: WFL  POSTURE: Rounding shoulders,   UPPER EXTREMITY ROM:   Active ROM Right eval Left eval  Shoulder flexion 115* 90  Shoulder extension 48 41  Shoulder abduction 81* 89  Shoulder adduction    Shoulder internal rotation    Shoulder external rotation Unable to test in testing position Unable to test  in testing position  (Blank rows = not tested) *  UPPER EXTREMITY MMT: Deferred at this time due to limited ROM and pain   MMT Right eval Left eval  Shoulder flexion    Shoulder extension    Shoulder abduction    Shoulder adduction    Shoulder internal rotation     Shoulder external rotation    Middle trapezius    Lower trapezius    Elbow flexion    Elbow extension    Wrist flexion    Wrist extension    Wrist ulnar deviation    Wrist radial deviation    Wrist pronation    Wrist supination    Grip strength (lbs)    (Blank rows = not tested)  SHOULDER SPECIAL TESTS: Deferred due to limited ROM   JOINT MOBILITY TESTING:  GH: hypomobile AP, PA, inferior glide bilateral   PALPATION:  Muscle guarding subscap, pectoral, glenohumeral region  Significant fluid on L flank    TODAY'S TREATMENT:                                                                                                                                         DATE: 07/12/23   TE: Reivew of wand activities: Supine AAROM:  Shoulder flex - 20 reps  Shoulder abd - 20 reps Shoulder ER- (elbows at 90 deg) - 20 reps  Comments: VC  to perform all within pain-free range  Active Scap retraction (sitting) 2 sets of 10 reps Posterior shoulder roll 2 sets of 15 reps UE Ranger- standing- small diameter shoulder circles - x 20 - progressing in height and diameter as able - 3 sets total.     Manual: On plinth-elevated to approx 45 deg PROM- L shoulder flexion, IR/ER, ABD, and  scaption within pain-free ranges x multiple reps.  Grade 2-3 inf GH joint mobs- 30 bouts x 5; PA/AP glides each- 30 bouts x 3 Gentle inf distraction (long axis- GH joint) x 20 sec hold x 4.    PATIENT EDUCATION: Education details: exercise technique, HEP, decrease ROM with all HEP interventions as necessary to improve comfort and perform pain-free Person educated: Patient Education method: Explanation, Demonstration, Tactile cues, Verbal cues, and Handouts Education comprehension: verbalized understanding, returned demonstration, verbal cues required, and tactile cues required  HOME EXERCISE PROGRAM: Access Code: [email or web address] URL: [email or web address] Date: 07/02/2023 Prepared by: Temple Pacini  Exercises - Seated Shoulder Shrugs  - 2 x daily - 6-7 x weekly - 3 sets - 10 reps - Seated Shoulder Shrug Circles AROM Backward  - 2 x daily - 6-7 x weekly - 3 sets - 10 reps - Seated Scapular Retraction  - 1 x daily - 6-7 x weekly - 3 sets - 10 reps  ASSESSMENT:  CLINICAL IMPRESSION: Treatment continued to focus on range of motion and gentle left Shoulder strengthening. Patient was pain limited with wand activities yet  did loosen up with PROM. She also performed well with UE ranger and able to demo some progression with ROM and felt "looser."  Patient will benefit from skilled physical therapy to improve ROM and strength and decrease pain for improved quality of life.     OBJECTIVE IMPAIRMENTS: Abnormal gait, decreased endurance, decreased mobility, decreased ROM, decreased strength, hypomobility, increased edema, increased fascial restrictions, impaired perceived functional ability, increased muscle spasms, impaired flexibility, impaired UE functional use, improper body mechanics, postural dysfunction, and pain.   ACTIVITY LIMITATIONS: carrying, lifting, bed mobility, bathing, toileting, dressing, self feeding, reach over head, hygiene/grooming, and caring for others  PARTICIPATION LIMITATIONS: meal prep, cleaning, laundry, medication management, shopping, community activity, and yard work  PERSONAL FACTORS: Age, Fitness, Past/current experiences, Time since onset of injury/illness/exacerbation, and 3+ comorbidities: anemia, anxiety, breast cancer, CKD, claustrophobia, depression, migraines, OA, PONV, sleep apnea, stroke, lumbar fusion, mastectomy.   are also affecting patient's functional outcome.   REHAB POTENTIAL: Good  CLINICAL DECISION MAKING: Evolving/moderate complexity  EVALUATION COMPLEXITY: Moderate   GOALS: Goals reviewed with patient? Yes  SHORT TERM GOALS: Target date: 07/25/2023    Patient will be independent in home exercise program to improve  strength/mobility for better functional independence with ADLs. Baseline: 10/23: give next session  Goal status: INITIAL    LONG TERM GOALS: Target date: 09/19/2023    Patient will increase FOTO score to equal to or greater than 49    to demonstrate statistically significant improvement in mobility and quality of life.  Baseline: 32 Goal status: INITIAL  2.  Patient will report a worst pain of 3/10 on VAS in L shoulder  to improve tolerance with ADLs and reduced symptoms with activities.  Baseline: 7/10  Goal status: INITIAL  3.  Patient will improve shoulder AROM to > 140 degrees of flexion, scaption, and abduction for improved ability to perform overhead activities. Baseline: see above Goal status: INITIAL  4.  Patient will improve shoulder strength to 4/5 for improved functional ADL and iADL performance Baseline: 16/10: unable to perform today due to pain and ROM limitations Goal status: INITIAL  5. Patient will increase six minute walk test distance to >1000 for progression to community ambulator and improve gait ability  Baseline: 652 ft with SPC  Goal status: INITIAL   PLAN:  PT FREQUENCY: 2x/week  PT DURATION: 12 weeks  PLANNED INTERVENTIONS: 97164- PT Re-evaluation, 97110-Therapeutic exercises, 97530- Therapeutic activity, 97112- Neuromuscular re-education, 97535- Self Care, 96045- Manual therapy, 97116- Gait training, 97014- Electrical stimulation (unattended), [phone]- Electrical stimulation (manual), [id number]- Traction (mechanical), Patient/Family education, Balance training, Taping, Joint mobilization, Spinal mobilization, Manual lymph drainage, Scar mobilization, Compression bandaging, Vestibular training, Visual/preceptual remediation/compensation, Cognitive remediation, Cryotherapy, Moist heat, and Biofeedback  PLAN FOR NEXT SESSION:  swelling reduction strategies, ROM Of shoulder, nustep, progress HEP as pt able   Lenda Kelp, PT 07/12/2023, 4:13 PM

## 2023-07-12 NOTE — Telephone Encounter (Signed)
Faxed order,demographics,insurance information, and recent office notes to [facility] for supplies for the patient.    Confirmation received and copy scanned into the chart.//AB/CMA

## 2023-07-13 ENCOUNTER — Other Ambulatory Visit: Payer: Self-pay | Admitting: Physical Medicine and Rehabilitation

## 2023-07-16 ENCOUNTER — Ambulatory Visit: Payer: Medicaid Other

## 2023-07-16 ENCOUNTER — Telehealth: Payer: Self-pay

## 2023-07-16 NOTE — Telephone Encounter (Signed)
Patient called due to no show. Voicemail left stating date and time of next appointment.    Precious Bard, PT, DPT Physical Therapist - [facility]  Outpatient Physical Therapy- Main Campus [phone]

## 2023-07-18 ENCOUNTER — Telehealth: Payer: Self-pay

## 2023-07-18 ENCOUNTER — Ambulatory Visit: Payer: Medicaid Other

## 2023-07-18 NOTE — Telephone Encounter (Signed)
Clinical Social Worker attempted to contact patient via phone for the second time to assess psychosocial needs.  CSW left her a vm with contact information and asked for a call back.

## 2023-07-18 NOTE — Telephone Encounter (Signed)
Patient Name: Vanessa Medina MRN: [id number] DOB:1971/05/10, 52 y.o., female Today's Date: 07/18/2023  Phone call to patient as she did show at designated time of 8am. Reached patient and she stated she misunderstood her time and thought it was 8:45 am. Offered 11 am later today yet patient stated she couldn't make that time. Instructed her of next scheduled visit  and mentioned placing her on a cancellation list to try to make up a visit prior to next visit. She agreed to being placed on list.    Lenda Kelp, PT 07/18/2023, 8:30 AM

## 2023-07-18 NOTE — Telephone Encounter (Signed)
Clinical Social Worker attempted to contact patient a second time to assess psychosocial needs.  Left a vm with contact information and requested a call back.

## 2023-07-19 ENCOUNTER — Telehealth: Payer: Self-pay | Admitting: *Deleted

## 2023-07-19 NOTE — Telephone Encounter (Signed)
Received on(07/16/23) via of fax insurance approval and [place] Medicaid Request for Prior Approval CMN/PA for Upper Extremity Compression from [facility].  Given to provider to sign and date.  Therapist, occupational and [place] Medicaid Request for Prior Approval CMN/PA was signed and faxed back to [employer].  Confirmation received and copy scanned into the chart.//AB/CMA

## 2023-07-20 ENCOUNTER — Inpatient Hospital Stay: Payer: Medicaid Other | Attending: Internal Medicine

## 2023-07-20 ENCOUNTER — Encounter: Payer: Self-pay | Admitting: *Deleted

## 2023-07-20 DIAGNOSIS — I972 Postmastectomy lymphedema syndrome: Secondary | ICD-10-CM | POA: Insufficient documentation

## 2023-07-20 DIAGNOSIS — C50812 Malignant neoplasm of overlapping sites of left female breast: Secondary | ICD-10-CM | POA: Insufficient documentation

## 2023-07-20 DIAGNOSIS — Z9013 Acquired absence of bilateral breasts and nipples: Secondary | ICD-10-CM | POA: Insufficient documentation

## 2023-07-20 DIAGNOSIS — Z170 Estrogen receptor positive status [ER+]: Secondary | ICD-10-CM | POA: Insufficient documentation

## 2023-07-20 NOTE — Progress Notes (Signed)
[facility] CSW Progress Note  Clinical Child psychotherapist contacted patient by phone to assess psychosocial needs.  She stated it was not a good time for her to talk.  CSW provided contact information and she said she would call back.    Dorothey Baseman, LCSW Clinical Social Worker [facility]    Patient is participating in a Managed Medicaid Plan:  Yes

## 2023-07-20 NOTE — Progress Notes (Signed)
Vanessa Medina called because she is having headaches, dizziness, lightheadedness since coming off her cymbalta.   Discussed with Dr. Alena Bills, there is not much that can be done for that.   If it is unbearable, Dr. Alena Bills said she could go back on the cymbalta, otherwise it will probably take some time for the side effects to dissipate.   Notified Vanessa Medina and she is going to try and give it a bit longer off of the cymbalta.

## 2023-07-25 ENCOUNTER — Ambulatory Visit: Payer: Medicaid Other | Admitting: Surgical

## 2023-07-25 ENCOUNTER — Encounter: Payer: Self-pay | Admitting: Surgical

## 2023-07-25 ENCOUNTER — Inpatient Hospital Stay: Payer: Medicaid Other | Admitting: Occupational Therapy

## 2023-07-25 VITALS — BP 135/83 | HR 65

## 2023-07-25 DIAGNOSIS — I890 Lymphedema, not elsewhere classified: Secondary | ICD-10-CM

## 2023-07-25 DIAGNOSIS — I972 Postmastectomy lymphedema syndrome: Secondary | ICD-10-CM | POA: Diagnosis present

## 2023-07-25 DIAGNOSIS — Z170 Estrogen receptor positive status [ER+]: Secondary | ICD-10-CM | POA: Diagnosis not present

## 2023-07-25 DIAGNOSIS — C50812 Malignant neoplasm of overlapping sites of left female breast: Secondary | ICD-10-CM | POA: Diagnosis present

## 2023-07-25 DIAGNOSIS — Z9889 Other specified postprocedural states: Secondary | ICD-10-CM

## 2023-07-25 DIAGNOSIS — Z9013 Acquired absence of bilateral breasts and nipples: Secondary | ICD-10-CM | POA: Diagnosis present

## 2023-07-25 DIAGNOSIS — R0789 Other chest pain: Secondary | ICD-10-CM

## 2023-07-25 NOTE — Progress Notes (Signed)
Patient is a 52 year old female here for follow-up after bilateral breast reconstruction.  She underwent removal of tissue expanders and placement of bilateral breast implants with Dr. Ulice Bold on 06/06/2023.  She is approximately 7 weeks postop.  She reports today that she continues to have pain of the left lateral breast.  She has been working with physical therapy for improvement in range of motion, pain and lymphedema of the left arm.  She does have a lymphedema sleeve on today and reports she is waiting on insurance authorization for a JoviPak.  She reports that there has been a noticeable and measurable an improvement in her lymphedema of her left arm.  She is hopeful that the Jovi pack will help with this as well.  She reports that some of her medications have been changed around lately due to interference with the tamoxifen and she has been noticing some symptomatic changes related to stopping Celebrex.  She is going to discuss this further with Dr. Berline Chough her physical medicine provider.  She is not having any infectious symptoms.  Chaperone present on exam On exam bilateral breast incisions are intact, bilateral mastectomy flaps are viable.  There is no erythema or cellulitic changes noted.  She does not have any subcutaneous fluid collection noted palpation.  Bilateral breast implants are soft.  Right breast is nontender.  Left breast is tender from the superior to the lateral pole, I do not appreciate any abnormal findings with palpation.  There is no erythema or overlying skin changes in the tender areas.  Implants are and the correct position and not moving laterally.  A/P:  Discussed with patient she needs to discuss her concerns related to medication changes with physical medicine provider and her oncologist.  She is going to discuss this next week.  Discussed continuing with massage to the left breast, we discussed the areas of discomfort were consistent with where the ADM is sutured  to her lateral chest wall.  Discussed also that the Jovi pack may assist with improvement in this discomfort as she may be having some postsurgical swelling.  I do not see any signs of infection or concern on exam today.  We discussed following up in a few weeks via telephone to discuss improvement with the Jovi pack.  She is scheduled to see Dr. Ulice Bold in about 1 month, recommend discussing this with her as well.  Encourage patient to call with any further questions or concerns.

## 2023-07-25 NOTE — Therapy (Signed)
[facility] [address] [place], [place], [address] Phone: [phone]   Fax:  [phone]  Occupational Therapy Treatment  Patient Details  Name: Vanessa Medina MRN: [id number] Date of Birth: 24-Dec-1970 No data recorded  Encounter Date: 07/25/2023   OT End of Session - 07/25/23 1739     Visit Number 4    Number of Visits 12    Date for OT Re-Evaluation 10/03/23    OT Start Time 0830    OT Stop Time 0901    OT Time Calculation (min) 31 min    Activity Tolerance Patient tolerated treatment well    Behavior During Therapy Vanessa Medina for tasks assessed/performed             Past Medical History:  Diagnosis Date   Anemia    Anxiety    Breast cancer (HCC) 01/18/2023   left breast   Cervical cancer (HCC)    Chronic kidney disease    Previous now clear   Claustrophobia    Depression    Family history of adverse reaction to anesthesia     " my mother takes a long time time wake up"   History of dysplastic nevus 01/18/2018   right shoulder, recurrent dysplastic nevus   History of kidney stones    History of shingles    Migraine    Multiple allergies    Obesity    Osteoarthritis    left hip   Pneumonia    PONV (postoperative nausea and vomiting)    slow to wake up   Sleep apnea    does not wear CPAP   Slow to wake up after anesthesia    Stroke (HCC) 08/11/2020   spinal cord stroke, spinal cord puncture during surgery   Wears glasses     Past Surgical History:  Procedure Laterality Date   ABDOMINAL HYSTERECTOMY     APPENDECTOMY     BACK SURGERY     BILIOPANCREATIC DIVISION W DUODENAL SWITCH N/A    BREAST BIOPSY Left 12/06/2022   u/s bx,1:00 heart clip, path pending   BREAST BIOPSY Left 12/06/2022   [name] bx 2:00 ribbon clip path pending   BREAST BIOPSY Left 12/06/2022   [name] LT BREAST BX W LOC DEV EA ADD LESION IMG BX SPEC US GUIDE 12/06/2022 [facility]   BREAST BIOPSY Left 12/06/2022   [name] LT BREAST BX  W LOC DEV 1ST LESION IMG BX SPEC US GUIDE 12/06/2022 [facility]   BREAST RECONSTRUCTION WITH PLACEMENT OF TISSUE EXPANDER AND FLEX HD (ACELLULAR HYDRATED DERMIS) Bilateral 01/18/2023   Procedure: IMMEDIATE LEFT BREAST RECONSTRUCTION WITH PLACEMENT OF TISSUE EXPANDER AND FLEX HD (ACELLULAR HYDRATED DERMIS);  Surgeon: Vanessa Form, DO;  Location: [facility];  Service: Plastics;  Laterality: Bilateral;   BREAST RECONSTRUCTION WITH PLACEMENT OF TISSUE EXPANDER AND FLEX HD (ACELLULAR HYDRATED DERMIS) Left 03/26/2023   Procedure: BREAST RECONSTRUCTION WITH PLACEMENT OF TISSUE EXPANDER;  Surgeon: Vanessa Form, DO;  Location: [facility];  Service: Plastics;  Laterality: Left;   CHOLECYSTECTOMY     COLONOSCOPY N/A 06/27/2021   Procedure: COLONOSCOPY;  Surgeon: Vanessa Collins, DO;  Location: [facility];  Service: Gastroenterology;  Laterality: N/A;   DIAGNOSTIC LAPAROSCOPY     LOA   ESOPHAGOGASTRODUODENOSCOPY N/A 06/27/2021   Procedure: ESOPHAGOGASTRODUODENOSCOPY (EGD);  Surgeon: Vanessa Collins, DO;  Location[facility];  Service: Gastroenterology;  Laterality: N/A;   ESOPHAGOGASTRODUODENOSCOPY (EGD) WITH PROPOFOL N/A 07/25/2017   Procedure: ESOPHAGOGASTRODUODENOSCOPY (EGD)  WITH PROPOFOL;  Surgeon: Vanessa Jun, MD;  Location: [facility];  Service: Endoscopy;  Laterality: N/A;   KNEE ARTHROSCOPY     LAPAROSCOPIC GASTRIC SLEEVE RESECTION WITH HIATAL HERNIA REPAIR     LUMBAR FUSION  08/29/2022   Revision Lumbar two through five Laminectomy & Fusion with Transforaminal Lumbar interbody fusion   MASTECTOMY W/ SENTINEL NODE BIOPSY Bilateral 01/18/2023   Procedure: MASTECTOMY WITH SENTINEL LYMPH NODE BIOPSY, RNFA to assist;  Surgeon: Vanessa Ro, MD;  Location: [facility];  Service: General;  Laterality: Bilateral;   REMOVAL OF TISSUE EXPANDER AND PLACEMENT OF IMPLANT Bilateral 06/06/2023   Procedure: REMOVAL OF TISSUE EXPANDER AND PLACEMENT OF  IMPLANT;  Surgeon: Vanessa Form, DO;  Location: [facility];  Service: Plastics;  Laterality: Bilateral;   TONSILLECTOMY     TOTAL HIP ARTHROPLASTY Left 01/26/2016   Procedure: LEFT TOTAL HIP ARTHROPLASTY ANTERIOR APPROACH;  Surgeon: Vanessa Kos, MD;  Location: [facility];  Service: Orthopedics;  Laterality: Left;   TRANSFORAMINAL LUMBAR INTERBODY FUSION (TLIF) WITH PEDICLE SCREW FIXATION 3 LEVEL  08/2019   Vanessa Browns, MD L2-L5    There were no vitals filed for this visit.   Subjective Assessment - 07/25/23 1738     Subjective  I picked up my compression sleeve Monday and has been wearing it 6 to 8 hours.  Waited for this insurance approval for my Jovi pack.  So I can call her by Monday if I can get a get.  The side of my breast still tender -seeing the surgeon later today.    Pertinent History ASSESSMENT & PLAN:   Vanessa Medina 52 y.o. female with pmh of Right-sided hemiparesis as complication from permanent spinal stimulator placement, orthostasis on midodrine and fludrocortisone, hypertension, migraine, GERD, cholecystectomy, lumbar fusion in December 2023, gastric sleeve was referred to medical oncology for newly diagnosed left breast hormone positive cancer.     # Left breast IDC, multifocal, ER/PR positive, HER2 negative  -The mass was detected on screening mammogram.  Imaging showing 3 masses. Mass 1 at 1:00 3 cm from the nipple measuring 7 x by 8 x 8 mm.  Mass 2 1:30 o'clock 1 cm from nipple measuring 9 x 7 x 9 mm.  Mass 3 at 2:00 1 cm from nipple measuring 6 x 8 x 4 mm.  No suspicious left axillary lymph node 1.  Mass 1 and mass 3 was biopsied consistent with invasive ductal cancer.  Both are strongly expressing ER and PR receptors.  HER2 negative.     -Status post bilateral mastectomies with SLNB left axilla and immediate bilateral reconstruction on 01/18/2023.  Pathology showed multifocal disease 3 separate foci- 12 mm with grade 1, 12 mm with grade 2 and 9 mm with grade  2 invasive mucinous. margins negative, 1/2 LN micrometastatic dz, 0.6 mm deposit, ER/PR >90% positive. HER2 negative.   -Oncotype DX score was 9.  No role for adjuvant chemotherapy.  -No role for adjuvant radiation.  -Patient decided to proceed with tamoxifen over aromatase inhibitor due to a side effect of osteoporosis.  Started on tamoxifen on 03/02/2023.  Overall has been tolerating well.  Myberitq has been switched to tolterodine due to interaction with tamoxifen.  She is on duloxetine also which is showing to decrease the efficacy of tamoxifen.  I will talk to the patient about it.  -She had multiple issues with tissue expanders.  Now has implant placed.  Follows with Dr. Ulice Bold.     #  Right apical nodule  -Detected on breast cancer staging.  CT chest without contrast showed 4 mm right apical nodule.    -Repeat CT scan showed stable right apical nodule likely benign calcified granuloma.  No further follow-up is recommended.    Patient Stated Goals Want to prevent lymphedema or see if you can help with swelling    Currently in Pain? Yes    Pain Score 7     Pain Location Breast    Pain Orientation Left    Pain Descriptors / Indicators Tender    Pain Type Surgical pain                 LYMPHEDEMA/ONCOLOGY QUESTIONNAIRE - 07/25/23 0001       Right Upper Extremity Lymphedema   15 cm Proximal to Olecranon Process 42.5 cm    10 cm Proximal to Olecranon Process 39.5 cm    Olecranon Process 29.5 cm    Across Hand at [facility] 20.5 cm      Left Upper Extremity Lymphedema   15 cm Proximal to Olecranon Process 43 cm    10 cm Proximal to Olecranon Process 39.5 cm    Olecranon Process 29.5 cm    15 cm Proximal to Ulnar Styloid Process 29.4 cm    10 cm Proximal to Ulnar Styloid Process 25 cm    Just Proximal to Ulnar Styloid Process 18 cm    Across Hand at [facility] 19.5 cm               Patient arrived with over-the-counter [employer] sleeve in place on left upper  extremity.  Patient has been wearing it for about 3 days 6 to 8 hours during the day. Upon assessing circumference patient's upper arm decreased. Patient continue to wear it 6 to 8 hours during the day for the next 2 weeks. Patient report had some insurance difficulties awaiting unilateral postmastectomy Jovi pack and compression bra. Was recommended to follow-up on Monday.  With DME.  If not got it. Patient has a follow-up with surgeon later today for continues pain and tenderness on lateral left breast. Patient reports increased discomfort and pain because of changes in her medication because of tolerance to tamoxifen Patient was educated in donning and wearing compression sleeve correctly -with less strain on left shoulder. Patient to follow-up in 2 weeks.              OT Education - 07/25/23 1739     Education Details Progress and changes to home program    Methods Explanation;Demonstration;Tactile cues;Verbal cues;Handout    Comprehension Verbal cues required;Returned demonstration;Verbalized understanding                 OT Long Term Goals - 07/11/23 1819       OT LONG TERM GOAL #1   Title Patient to be independent in home program to increase active range of motion in bilateral shoulders with functional limb at with pain less than a 2/10 pain    Baseline Patient followed by physical therapy    Status Deferred      OT LONG TERM GOAL #2   Title Monitor and prevent lymphedema in bilateral upper extremities and thoracic and assess if needed compression.    Baseline Patient is right-hand dominant.  Do show increased swelling in the left upper arm as well as chest but has a ruptured expander at the moment. NOW patient return after being seen in June.  Patient had multiple  surgeries to the left breast.  Increased lymphedema left breast, thoracic and upper arm.  Recommend compression with continue to monitor progress with home program    Time 12    Period Weeks     Status On-going    Target Date 10/03/23      OT LONG TERM GOAL #3   Title Bilateral shoulder strength improved for patient to pull overhead shirt and reach overhead with 2 pound weight independently    Baseline Patient get physical therapy    Status Deferred      OT LONG TERM GOAL #4   Title Bilateral shoulder external rotation for patient to be able to wash and do hair with no increase symptoms.    Baseline Patient receiving physical therapy    Status Deferred                   Plan - 07/25/23 1740     Clinical Impression Statement Pt refer to OT after being seen the last time in June 24 -  post bilateral mastectomy with immediate reconstruction using tissue expanders and Flex HD performed on 01/18/2023 by Dr. Ulice Bold and Dr. Everlene Farrier. Breast CA diagnosis on the L.  Pt report had multiple surgeries to L breast since seen OT in June- and in PT for bilateral shoulder stiffness. Patient is right-hand dominant.  Pt report and show increase swelling and lymphedema in lateral L breast, axilla and thorasic - with increase of circumference in L UE -  pt seen for follow up today after starting wearing over the counter compression sleeve - L UE circumference decrease - breast still tender and pt waiting for insurance approval unilateral post mastectomy breast pad jovipak and compression bra for L thoracic - p to cont to wear sleeve 6-8 hrs day and jovipak 4-8 hrs and follow up in  2 wks - Patient can benefit from skilled OT services to setup pt with homeprogram , appropriate pression garments and monitor to decrease lymphedema, discomfort and pain in left breast, thoracic and upper extremity.    OT Occupational Profile and History Problem Focused Assessment - Including review of records relating to presenting problem    Occupational performance deficits (Please refer to evaluation for details): ADL's;IADL's;Rest and Sleep;Social Participation;Leisure    Body Structure / Function / Physical Skills  ADL;Decreased knowledge of precautions;Flexibility;ROM;UE functional use;Scar mobility;IADL;Pain;Skin integrity    Rehab Potential Good    Clinical Decision Making Several treatment options, min-mod task modification necessary    Comorbidities Affecting Occupational Performance: May have comorbidities impacting occupational performance    Modification or Assistance to Complete Evaluation  Min-Moderate modification of tasks or assist with assess necessary to complete eval    OT Frequency Biweekly    OT Duration 12 weeks    OT Treatment/Interventions Self-care/ADL training;Manual lymph drainage;Therapeutic exercise;Patient/family education;DME and/or AE instruction;Manual Therapy;Scar mobilization;Therapeutic activities    Consulted and Agree with Plan of Care Patient             Patient will benefit from skilled therapeutic intervention in order to improve the following deficits and impairments:   Body Structure / Function / Physical Skills: ADL, Decreased knowledge of precautions, Flexibility, ROM, UE functional use, Scar mobility, IADL, Pain, Skin integrity       Visit Diagnosis: Post-mastectomy lymphedema syndrome  Chest pain, muscular    Problem List Patient Active Problem List   Diagnosis Date Noted   Long-term current use of tamoxifen 06/26/2023   Claustrophobia    Pernicious anemia 03/14/2023  Ruptured left breast implant 02/13/2023   S/P mastectomy, bilateral 01/18/2023   Genetic testing 12/18/2022   Breast cancer (HCC) 12/08/2022   Family history of breast cancer 12/08/2022   Profound fatigue 12/08/2022   Lumbar post-laminectomy syndrome 06/02/2022   Pseudarthrosis after fusion or arthrodesis 05/19/2022   Primary osteoarthritis of right knee 03/28/2022   Subjective tinnitus of both ears 12/21/2021   Abnormality of gait 04/29/2021   Abnormal sensation in both ears 04/06/2021   Other adverse food reactions, not elsewhere classified, subsequent encounter  04/06/2021   Multiple drug allergies 04/06/2021   Insomnia due to medical condition 02/25/2021   Myofascial muscle pain 02/25/2021   Nerve pain 10/18/2020   Incomplete paraplegia (HCC) 10/18/2020   Orthostatic hypotension 10/18/2020   Renal cyst 09/23/2020   Chronic migraine without aura without status migrainosus, not intractable    Hypokalemia    Adjustment reaction with anxiety and depression    Spinal cord injury, lumbar, without spinal bone injury, sequela (HCC) 08/18/2020   Paraparesis (HCC)    Post-operative pain    Hypotension    Slow transit constipation    AKI (acute kidney injury) (HCC)    Right leg weakness 08/11/2020   DDD (degenerative disc disease), lumbar 09/02/2019    Class: Chronic   Degenerative disc disease, lumbar 09/02/2019   Chest tightness 09/23/2018   Bradycardia 09/23/2018   BMI 40.0-44.9, adult (HCC) 04/17/2018   Status post bariatric surgery 04/17/2018   Labile blood pressure 03/08/2018   Chronic low back pain 09/03/2017   History of total hip replacement, left 01/26/2016   Osteoarthritis of spine with radiculopathy, lumbar region 10/16/2014   EDEMA 04/28/2008   LEG PAIN, BILATERAL 12/16/2007   Malignant neoplasm of cervix uteri (HCC) 07/02/2007   MORBID OBESITY 07/02/2007   DEPRESSION 07/02/2007   Migraine without aura 07/02/2007   Other allergic rhinitis 07/02/2007   Asthma 07/02/2007   GERD 07/02/2007   ELEVATED BLOOD PRESSURE WITHOUT DIAGNOSIS OF HYPERTENSION 07/02/2007   Asthma 07/02/2007    Oletta Cohn, OTR/L,CLT 07/25/2023, 5:45 PM  [facility] [address], [place], [address] Phone: [phone]   Fax:  [phone]  Name: Vanessa Medina MRN: [id number] Date of Birth: 05-24-1971

## 2023-07-26 ENCOUNTER — Ambulatory Visit: Payer: Medicaid Other

## 2023-07-26 DIAGNOSIS — M25612 Stiffness of left shoulder, not elsewhere classified: Secondary | ICD-10-CM

## 2023-07-26 DIAGNOSIS — M6281 Muscle weakness (generalized): Secondary | ICD-10-CM | POA: Diagnosis not present

## 2023-07-26 DIAGNOSIS — M25512 Pain in left shoulder: Secondary | ICD-10-CM

## 2023-07-26 NOTE — Therapy (Signed)
OUTPATIENT PHYSICAL THERAPY SHOULDER and ORTHO TREATMENT NOTE   Patient Name: Vanessa Medina MRN: [id number] DOB:[birth year], 52 y.o., female Today's Date: 07/27/2023  END OF SESSION:  PT End of Session - 07/26/23 1056     Visit Number 6    Number of Visits 24    Date for PT Re-Evaluation 09/19/23    Progress Note Due on Visit 10    PT Start Time 1104    PT Stop Time 1145    PT Time Calculation (min) 41 min    Activity Tolerance Patient tolerated treatment well;No increased pain    Behavior During Therapy WFL for tasks assessed/performed                 Past Medical History:  Diagnosis Date   Anemia    Anxiety    Breast cancer (HCC) 01/18/2023   left breast   Cervical cancer (HCC)    Chronic kidney disease    Previous now clear   Claustrophobia    Depression    Family history of adverse reaction to anesthesia     " my mother takes a long time time wake up"   History of dysplastic nevus 01/18/2018   right shoulder, recurrent dysplastic nevus   History of kidney stones    History of shingles    Migraine    Multiple allergies    Obesity    Osteoarthritis    left hip   Pneumonia    PONV (postoperative nausea and vomiting)    slow to wake up   Sleep apnea    does not wear CPAP   Slow to wake up after anesthesia    Stroke (HCC) 08/11/2020   spinal cord stroke, spinal cord puncture during surgery   Wears glasses    Past Surgical History:  Procedure Laterality Date   ABDOMINAL HYSTERECTOMY     APPENDECTOMY     BACK SURGERY     BILIOPANCREATIC DIVISION W DUODENAL SWITCH N/A    BREAST BIOPSY Left 12/06/2022   u/s bx,1:00 heart clip, path pending   BREAST BIOPSY Left 12/06/2022   [name] bx 2:00 ribbon clip path pending   BREAST BIOPSY Left 12/06/2022   [name] LT BREAST BX W LOC DEV EA ADD LESION IMG BX SPEC US GUIDE 12/06/2022 [facility]   BREAST BIOPSY Left 12/06/2022   [name] LT BREAST BX W LOC DEV 1ST LESION IMG BX SPEC US GUIDE 12/06/2022 [facility]    BREAST RECONSTRUCTION WITH PLACEMENT OF TISSUE EXPANDER AND FLEX HD (ACELLULAR HYDRATED DERMIS) Bilateral 01/18/2023   Procedure: IMMEDIATE LEFT BREAST RECONSTRUCTION WITH PLACEMENT OF TISSUE EXPANDER AND FLEX HD (ACELLULAR HYDRATED DERMIS);  Surgeon: Peggye Form, DO;  Location: [facility];  Service: Plastics;  Laterality: Bilateral;   BREAST RECONSTRUCTION WITH PLACEMENT OF TISSUE EXPANDER AND FLEX HD (ACELLULAR HYDRATED DERMIS) Left 03/26/2023   Procedure: BREAST RECONSTRUCTION WITH PLACEMENT OF TISSUE EXPANDER;  Surgeon: Peggye Form, DO;  Location: [facility];  Service: Plastics;  Laterality: Left;   CHOLECYSTECTOMY     COLONOSCOPY N/A 06/27/2021   Procedure: COLONOSCOPY;  Surgeon: Jaynie Collins, DO;  Location: [facility];  Service: Gastroenterology;  Laterality: N/A;   DIAGNOSTIC LAPAROSCOPY     LOA   ESOPHAGOGASTRODUODENOSCOPY N/A 06/27/2021   Procedure: ESOPHAGOGASTRODUODENOSCOPY (EGD);  Surgeon: Jaynie Collins, DO;  Location: [facility];  Service: Gastroenterology;  Laterality: N/A;   ESOPHAGOGASTRODUODENOSCOPY (EGD) WITH PROPOFOL N/A 07/25/2017   Procedure: ESOPHAGOGASTRODUODENOSCOPY (EGD) WITH PROPOFOL;  Surgeon: Scot Jun,  MD;  Location: [facility];  Service: Endoscopy;  Laterality: N/A;   KNEE ARTHROSCOPY     LAPAROSCOPIC GASTRIC SLEEVE RESECTION WITH HIATAL HERNIA REPAIR     LUMBAR FUSION  08/29/2022   Revision Lumbar two through five Laminectomy & Fusion with Transforaminal Lumbar interbody fusion   MASTECTOMY W/ SENTINEL NODE BIOPSY Bilateral 01/18/2023   Procedure: MASTECTOMY WITH SENTINEL LYMPH NODE BIOPSY, RNFA to assist;  Surgeon: Leafy Ro, MD;  Location: [facility];  Service: General;  Laterality: Bilateral;   REMOVAL OF TISSUE EXPANDER AND PLACEMENT OF IMPLANT Bilateral 06/06/2023   Procedure: REMOVAL OF TISSUE EXPANDER AND PLACEMENT OF IMPLANT;  Surgeon: Peggye Form, DO;  Location: [facility];  Service: Plastics;  Laterality: Bilateral;   TONSILLECTOMY     TOTAL HIP ARTHROPLASTY Left 01/26/2016   Procedure: LEFT TOTAL HIP ARTHROPLASTY ANTERIOR APPROACH;  Surgeon: Tarry Kos, MD;  Location: [facility];  Service: Orthopedics;  Laterality: Left;   TRANSFORAMINAL LUMBAR INTERBODY FUSION (TLIF) WITH PEDICLE SCREW FIXATION 3 LEVEL  08/2019   Vira Browns, MD L2-L5   Patient Active Problem List   Diagnosis Date Noted   Long-term current use of tamoxifen 06/26/2023   Claustrophobia    Pernicious anemia 03/14/2023   Ruptured left breast implant 02/13/2023   S/P mastectomy, bilateral 01/18/2023   Genetic testing 12/18/2022   Breast cancer (HCC) 12/08/2022   Family history of breast cancer 12/08/2022   Profound fatigue 12/08/2022   Lumbar post-laminectomy syndrome 06/02/2022   Pseudarthrosis after fusion or arthrodesis 05/19/2022   Primary osteoarthritis of right knee 03/28/2022   Subjective tinnitus of both ears 12/21/2021   Abnormality of gait 04/29/2021   Abnormal sensation in both ears 04/06/2021   Other adverse food reactions, not elsewhere classified, subsequent encounter 04/06/2021   Multiple drug allergies 04/06/2021   Insomnia due to medical condition 02/25/2021   Myofascial muscle pain 02/25/2021   Nerve pain 10/18/2020   Incomplete paraplegia (HCC) 10/18/2020   Orthostatic hypotension 10/18/2020   Renal cyst 09/23/2020   Chronic migraine without aura without status migrainosus, not intractable    Hypokalemia    Adjustment reaction with anxiety and depression    Spinal cord injury, lumbar, without spinal bone injury, sequela (HCC) 08/18/2020   Paraparesis (HCC)    Post-operative pain    Hypotension    Slow transit constipation    AKI (acute kidney injury) (HCC)    Right leg weakness 08/11/2020   DDD (degenerative disc disease), lumbar 09/02/2019    Class: Chronic   Degenerative disc disease, lumbar 09/02/2019   Chest tightness 09/23/2018    Bradycardia 09/23/2018   BMI 40.0-44.9, adult (HCC) 04/17/2018   Status post bariatric surgery 04/17/2018   Labile blood pressure 03/08/2018   Chronic low back pain 09/03/2017   History of total hip replacement, left 01/26/2016   Osteoarthritis of spine with radiculopathy, lumbar region 10/16/2014   EDEMA 04/28/2008   LEG PAIN, BILATERAL 12/16/2007   Malignant neoplasm of cervix uteri (HCC) 07/02/2007   MORBID OBESITY 07/02/2007   DEPRESSION 07/02/2007   Migraine without aura 07/02/2007   Other allergic rhinitis 07/02/2007   Asthma 07/02/2007   GERD 07/02/2007   ELEVATED BLOOD PRESSURE WITHOUT DIAGNOSIS OF HYPERTENSION 07/02/2007   Asthma 07/02/2007    PCP: [name] MD  REFERRING PROVIDER: Caroline More  REFERRING DIAG: s/p breast reconstruction  THERAPY DIAG:  Muscle weakness (generalized)  Stiffness of left shoulder, not elsewhere classified  Left shoulder pain, unspecified chronicity  Rationale  for Evaluation and Treatment: Rehabilitation  ONSET DATE: December 07 2022  SUBJECTIVE:                                                                                                                                                                                      SUBJECTIVE STATEMENT: Pt reports pain level has been slightly increased in LUE since some necessary medication changes. She saw her physician yesterday, was reassured that the implant was in the correct place, & reports/thinks ongoing pain may also be due to extensive surgery on L side.   Hand dominance: Right  PERTINENT HISTORY: Patient is having LUE ROM issues s/p breast cancer implant reconstruction. Patient underwent bilateral exchange of tissue expanders for implants, bilateral capsulotomies for implant repositioning and right breast capsulectomy with major repositioning of 3 x 8 cm of capsule on 06/06/2023. Patient had back surgery December 26th 2023 and then was diagnosed with breast cancer December 07, 2022. Had surgery Jan 18, 2023 for her breast cancer, had double mastectomy. Patient had two sets of expanders, first one burst, the second one kept flipping and moving. Had to have more work on L side. PMH includes: anemia, anxiety, breast cancer, CKD, claustrophobia, depression, migraines, OA, PONV, sleep apnea, stroke, lumbar fusion, mastectomy.   PAIN:  Are you having pain? Yes: NPRS scale: 4/10 Pain location: L shoulder and breast Pain description: post surgical Aggravating factors: lift something  Relieving factors: none Worst: 7/10 Least: 2/10    PRECAUTIONS: Other: no lifting greater than 1/2 gallon   RED FLAGS: None   WEIGHT BEARING RESTRICTIONS:  no more than 1/2 gallon  FALLS:  Has patient fallen in last 6 months? Yes. Number of falls 1  LIVING ENVIRONMENT: Lives with: lives with their family Lives in: House/apartment Stairs: Yes: External: 2 steps; on right going up and on left going up Has following equipment at home: Single point cane, Walker - 2 wheeled, shower chair, and Grab bars  OCCUPATION: Disability   PLOF: Independent with basic ADLs  PATIENT GOALS:to decrease pain in L shoulder   NEXT MD VISIT:   OBJECTIVE:  Note: Objective measures were completed at Evaluation unless otherwise noted.  DIAGNOSTIC FINDINGS:  N/a   PATIENT SURVEYS:  FOTO 32  COGNITION: Overall cognitive status:  some short term memory      SENSATION: WFL  POSTURE: Rounding shoulders,   UPPER EXTREMITY ROM:   Active ROM Right eval Left eval  Shoulder flexion 115* 90  Shoulder extension 48 41  Shoulder abduction 81* 89  Shoulder adduction    Shoulder internal rotation    Shoulder external rotation Unable to test in testing position Unable  to test in testing position  (Blank rows = not tested) *  UPPER EXTREMITY MMT: Deferred at this time due to limited ROM and pain   MMT Right eval Left eval  Shoulder flexion    Shoulder extension    Shoulder abduction     Shoulder adduction    Shoulder internal rotation    Shoulder external rotation    Middle trapezius    Lower trapezius    Elbow flexion    Elbow extension    Wrist flexion    Wrist extension    Wrist ulnar deviation    Wrist radial deviation    Wrist pronation    Wrist supination    Grip strength (lbs)    (Blank rows = not tested)  SHOULDER SPECIAL TESTS: Deferred due to limited ROM   JOINT MOBILITY TESTING:  GH: hypomobile AP, PA, inferior glide bilateral   PALPATION:  Muscle guarding subscap, pectoral, glenohumeral region  Significant fluid on L flank    TODAY'S TREATMENT:                                                                                                                                         DATE: 07/27/23   TE: hooklye on plinth (bolster support for BLE) Continued reivew of wand activities: Supine AAROM: LUE Shoulder abd - 20 reps - fatiguing  Shoulder flex - 20 reps - achieves pain-free AROM, observed WFL  Shoulder ER- (elbows at 90 deg) - 20 reps  Comments: VC from PT to perform all of the above within pain-free range  Standing isometrics with towel roll: LUE -Flexion 2x10  -Extension 2x10 Comments: added to HEP (see below)  Manual: hooklye on plinth (bolster support for BLE) PA/AP glides - 5 bouts 15 sec - discontinued as pt reports some discomfort with intervention  Gentle inf distraction (long axis- GH joint) 2x30 sec  PROM- L shoulder flexion, IR/ER, ABD, and  scaption within pain-free ranges x multiple reps.  TrP release 2x20 sec to L axilla over scar site (pt reports scar has healed well) while providing PROM L shoulder flexion - exhibits improvement with ROM with addition of TrP release during movement    Provided printout and reviewed during visit to confirm technique: Access Code: [email or web address] URL: [email or web address] Date: 07/26/2023 Prepared by: Temple Pacini  Exercises - Isometric Shoulder Flexion at Wall  - 1 x  daily - 7 x weekly - 2 sets - 10 reps - Isometric Shoulder Extension at Wall  - 1 x daily - 7 x weekly - 2 sets - 10 reps   PATIENT EDUCATION: Education details: exercise technique, HEP, decrease resistance with all HEP interventions as necessary to improve comfort and perform pain-free (push into towel roll as gently as needed), discontinue HEP until seen by PT if interventions cause increased pain Person educated: Patient Education method: Explanation, Demonstration, Tactile cues, Verbal cues, and Handouts Education  comprehension: verbalized understanding, returned demonstration, verbal cues required, and tactile cues required  HOME EXERCISE PROGRAM: Access Code: [email or web address] URL: [email or web address] Date: 07/02/2023 Prepared by: Temple Pacini  Exercises - Seated Shoulder Shrugs  - 2 x daily - 6-7 x weekly - 3 sets - 10 reps - Seated Shoulder Shrug Circles AROM Backward  - 2 x daily - 6-7 x weekly - 3 sets - 10 reps - Seated Scapular Retraction  - 1 x daily - 6-7 x weekly - 3 sets - 10 reps  ASSESSMENT:  CLINICAL IMPRESSION: Continued plan as laid out in recent sessions. Pt tolerated interventions well without increased pain from baseline and observed to complete interventions with increased ROM. HEP updated for gentle isometric strengthening. Plan to review next visit to determine if pt can further progress these exercises. Patient will benefit from skilled physical therapy to improve ROM and strength and decrease pain for improved quality of life.     OBJECTIVE IMPAIRMENTS: Abnormal gait, decreased endurance, decreased mobility, decreased ROM, decreased strength, hypomobility, increased edema, increased fascial restrictions, impaired perceived functional ability, increased muscle spasms, impaired flexibility, impaired UE functional use, improper body mechanics, postural dysfunction, and pain.   ACTIVITY LIMITATIONS: carrying, lifting, bed mobility, bathing, toileting,  dressing, self feeding, reach over head, hygiene/grooming, and caring for others  PARTICIPATION LIMITATIONS: meal prep, cleaning, laundry, medication management, shopping, community activity, and yard work  PERSONAL FACTORS: Age, Fitness, Past/current experiences, Time since onset of injury/illness/exacerbation, and 3+ comorbidities: anemia, anxiety, breast cancer, CKD, claustrophobia, depression, migraines, OA, PONV, sleep apnea, stroke, lumbar fusion, mastectomy.   are also affecting patient's functional outcome.   REHAB POTENTIAL: Good  CLINICAL DECISION MAKING: Evolving/moderate complexity  EVALUATION COMPLEXITY: Moderate   GOALS: Goals reviewed with patient? Yes  SHORT TERM GOALS: Target date: 07/25/2023    Patient will be independent in home exercise program to improve strength/mobility for better functional independence with ADLs. Baseline: 10/23: give next session  Goal status: INITIAL    LONG TERM GOALS: Target date: 09/19/2023    Patient will increase FOTO score to equal to or greater than 49    to demonstrate statistically significant improvement in mobility and quality of life.  Baseline: 32 Goal status: INITIAL  2.  Patient will report a worst pain of 3/10 on VAS in L shoulder  to improve tolerance with ADLs and reduced symptoms with activities.  Baseline: 7/10  Goal status: INITIAL  3.  Patient will improve shoulder AROM to > 140 degrees of flexion, scaption, and abduction for improved ability to perform overhead activities. Baseline: see above Goal status: INITIAL  4.  Patient will improve shoulder strength to 4/5 for improved functional ADL and iADL performance Baseline: 96/29: unable to perform today due to pain and ROM limitations Goal status: INITIAL  5. Patient will increase six minute walk test distance to >1000 for progression to community ambulator and improve gait ability  Baseline: 652 ft with SPC  Goal status: INITIAL   PLAN:  PT  FREQUENCY: 2x/week  PT DURATION: 12 weeks  PLANNED INTERVENTIONS: 97164- PT Re-evaluation, 97110-Therapeutic exercises, 97530- Therapeutic activity, 97112- Neuromuscular re-education, 97535- Self Care, 52841- Manual therapy, [id number]- Gait training, 97014- Electrical stimulation (unattended), [phone]- Electrical stimulation (manual), [id number]- Traction (mechanical), Patient/Family education, Balance training, Taping, Joint mobilization, Spinal mobilization, Manual lymph drainage, Scar mobilization, Compression bandaging, Vestibular training, Visual/preceptual remediation/compensation, Cognitive remediation, Cryotherapy, Moist heat, and Biofeedback  PLAN FOR NEXT SESSION:  swelling reduction strategies, ROM Of shoulder, nustep, isometrics, progress HEP as pt able  Baird Kay, PT 07/27/2023, 9:57 AM

## 2023-07-30 ENCOUNTER — Encounter: Payer: Self-pay | Admitting: Gastroenterology

## 2023-07-31 ENCOUNTER — Ambulatory Visit: Payer: Medicaid Other

## 2023-07-31 DIAGNOSIS — M25612 Stiffness of left shoulder, not elsewhere classified: Secondary | ICD-10-CM

## 2023-07-31 DIAGNOSIS — M6281 Muscle weakness (generalized): Secondary | ICD-10-CM | POA: Diagnosis not present

## 2023-07-31 DIAGNOSIS — G8929 Other chronic pain: Secondary | ICD-10-CM

## 2023-07-31 NOTE — Therapy (Signed)
OUTPATIENT PHYSICAL THERAPY SHOULDER and ORTHO TREATMENT NOTE   Patient Name: HATSUYO NISSIM MRN: [id number] DOB:1970-09-22, 52 y.o., female Today's Date: 07/31/2023  END OF SESSION:  PT End of Session - 07/31/23 1449     Visit Number 7    Number of Visits 24    Date for PT Re-Evaluation 09/19/23    Progress Note Due on Visit 10    PT Start Time 1447    PT Stop Time 1528    PT Time Calculation (min) 41 min    Activity Tolerance Patient tolerated treatment well;Patient limited by pain    Behavior During Therapy [facility] for tasks assessed/performed                  Past Medical History:  Diagnosis Date   Acute nontraumatic kidney injury (HCC)    Anemia    Anxiety    Benign cyst of kidney    Breast cancer (HCC) 01/18/2023   left breast   Cervical cancer (HCC)    Chronic hypotension    Chronic kidney disease    Previous now clear   Claustrophobia    Decompression injury of spinal cord    Depression    Family history of adverse reaction to anesthesia     " my mother takes a long time time wake up"   History of dysplastic nevus 01/18/2018   right shoulder, recurrent dysplastic nevus   History of kidney stones    History of shingles    Migraine    Multiple allergies    Obesity    Osteoarthritis    left hip   Pneumonia    PONV (postoperative nausea and vomiting)    slow to wake up   Sleep apnea    does not wear CPAP   Slow to wake up after anesthesia    Stroke (HCC) 08/11/2020   spinal cord stroke, spinal cord puncture during surgery   Wears glasses    Past Surgical History:  Procedure Laterality Date   ABDOMINAL HYSTERECTOMY     APPENDECTOMY     BACK SURGERY     BILIOPANCREATIC DIVISION W DUODENAL SWITCH N/A    BREAST BIOPSY Left 12/06/2022   u/s bx,1:00 heart clip, path pending   BREAST BIOPSY Left 12/06/2022   [name] bx 2:00 ribbon clip path pending   BREAST BIOPSY Left 12/06/2022   [name] LT BREAST BX W LOC DEV EA ADD LESION IMG BX SPEC US GUIDE  12/06/2022 [facility]   BREAST BIOPSY Left 12/06/2022   [name] LT BREAST BX W LOC DEV 1ST LESION IMG BX SPEC US GUIDE 12/06/2022 [facility]   BREAST RECONSTRUCTION WITH PLACEMENT OF TISSUE EXPANDER AND FLEX HD (ACELLULAR HYDRATED DERMIS) Bilateral 01/18/2023   Procedure: IMMEDIATE LEFT BREAST RECONSTRUCTION WITH PLACEMENT OF TISSUE EXPANDER AND FLEX HD (ACELLULAR HYDRATED DERMIS);  Surgeon: Peggye Form, DO;  Location: [facility];  Service: Plastics;  Laterality: Bilateral;   BREAST RECONSTRUCTION WITH PLACEMENT OF TISSUE EXPANDER AND FLEX HD (ACELLULAR HYDRATED DERMIS) Left 03/26/2023   Procedure: BREAST RECONSTRUCTION WITH PLACEMENT OF TISSUE EXPANDER;  Surgeon: Peggye Form, DO;  Location: [facility];  Service: Plastics;  Laterality: Left;   CHOLECYSTECTOMY     COLONOSCOPY N/A 06/27/2021   Procedure: COLONOSCOPY;  Surgeon: Jaynie Collins, DO;  Location: [facility];  Service: Gastroenterology;  Laterality: N/A;   DIAGNOSTIC LAPAROSCOPY     LOA   ESOPHAGOGASTRODUODENOSCOPY N/A 06/27/2021   Procedure: ESOPHAGOGASTRODUODENOSCOPY (EGD);  Surgeon: Jaynie Collins, DO;  Location: [facility];  Service: Gastroenterology;  Laterality: N/A;   ESOPHAGOGASTRODUODENOSCOPY (EGD) WITH PROPOFOL N/A 07/25/2017   Procedure: ESOPHAGOGASTRODUODENOSCOPY (EGD) WITH PROPOFOL;  Surgeon: Scot Jun, MD;  Location: [facility]COPY;  Service: Endoscopy;  Laterality: N/A;   FLEXIBLE BRONCHOSCOPY     HERNIA REPAIR     KNEE ARTHROSCOPY     LAPAROSCOPIC GASTRIC SLEEVE RESECTION WITH HIATAL HERNIA REPAIR     LUMBAR FUSION  08/29/2022   Revision Lumbar two through five Laminectomy & Fusion with Transforaminal Lumbar interbody fusion   MASTECTOMY W/ SENTINEL NODE BIOPSY Bilateral 01/18/2023   Procedure: MASTECTOMY WITH SENTINEL LYMPH NODE BIOPSY, RNFA to assist;  Surgeon: Leafy Ro, MD;  Location: [facility];  Service: General;  Laterality: Bilateral;   REMOVAL  OF TISSUE EXPANDER AND PLACEMENT OF IMPLANT Bilateral 06/06/2023   Procedure: REMOVAL OF TISSUE EXPANDER AND PLACEMENT OF IMPLANT;  Surgeon: Peggye Form, DO;  Location: [facility];  Service: Plastics;  Laterality: Bilateral;   SPINAL CORD STIMULATOR IMPLANT     TONSILLECTOMY     TOTAL HIP ARTHROPLASTY Left 01/26/2016   Procedure: LEFT TOTAL HIP ARTHROPLASTY ANTERIOR APPROACH;  Surgeon: Tarry Kos, MD;  Location: [facility];  Service: Orthopedics;  Laterality: Left;   TRANSFORAMINAL LUMBAR INTERBODY FUSION (TLIF) WITH PEDICLE SCREW FIXATION 3 LEVEL  08/2019   Vira Browns, MD L2-L5   Patient Active Problem List   Diagnosis Date Noted   Long-term current use of tamoxifen 06/26/2023   Claustrophobia    Pernicious anemia 03/14/2023   Ruptured left breast implant 02/13/2023   S/P mastectomy, bilateral 01/18/2023   Genetic testing 12/18/2022   Breast cancer (HCC) 12/08/2022   Family history of breast cancer 12/08/2022   Profound fatigue 12/08/2022   Lumbar post-laminectomy syndrome 06/02/2022   Pseudarthrosis after fusion or arthrodesis 05/19/2022   Primary osteoarthritis of right knee 03/28/2022   Subjective tinnitus of both ears 12/21/2021   Abnormality of gait 04/29/2021   Abnormal sensation in both ears 04/06/2021   Other adverse food reactions, not elsewhere classified, subsequent encounter 04/06/2021   Multiple drug allergies 04/06/2021   Insomnia due to medical condition 02/25/2021   Myofascial muscle pain 02/25/2021   Nerve pain 10/18/2020   Incomplete paraplegia (HCC) 10/18/2020   Orthostatic hypotension 10/18/2020   Renal cyst 09/23/2020   Chronic migraine without aura without status migrainosus, not intractable    Hypokalemia    Adjustment reaction with anxiety and depression    Spinal cord injury, lumbar, without spinal bone injury, sequela (HCC) 08/18/2020   Paraparesis (HCC)    Post-operative pain    Hypotension    Slow transit constipation    AKI  (acute kidney injury) (HCC)    Right leg weakness 08/11/2020   DDD (degenerative disc disease), lumbar 09/02/2019    Class: Chronic   Degenerative disc disease, lumbar 09/02/2019   Chest tightness 09/23/2018   Bradycardia 09/23/2018   BMI 40.0-44.9, adult (HCC) 04/17/2018   Status post bariatric surgery 04/17/2018   Labile blood pressure 03/08/2018   Chronic low back pain 09/03/2017   History of total hip replacement, left 01/26/2016   Osteoarthritis of spine with radiculopathy, lumbar region 10/16/2014   EDEMA 04/28/2008   LEG PAIN, BILATERAL 12/16/2007   Malignant neoplasm of cervix uteri (HCC) 07/02/2007   MORBID OBESITY 07/02/2007   DEPRESSION 07/02/2007   Migraine without aura 07/02/2007   Other allergic rhinitis 07/02/2007   Asthma 07/02/2007   GERD 07/02/2007   ELEVATED BLOOD PRESSURE WITHOUT  DIAGNOSIS OF HYPERTENSION 07/02/2007   Asthma 07/02/2007    PCP: [name] MD  REFERRING PROVIDER: Caroline More  REFERRING DIAG: s/p breast reconstruction  THERAPY DIAG:  Stiffness of left shoulder, not elsewhere classified  Muscle weakness (generalized)  Chronic left shoulder pain  Rationale for Evaluation and Treatment: Rehabilitation  ONSET DATE: December 07 2022  SUBJECTIVE:                                                                                                                                                                                      SUBJECTIVE STATEMENT: Pt ambulating with cane today secondary to increased knee pain.   Hand dominance: Right  PERTINENT HISTORY: Patient is having LUE ROM issues s/p breast cancer implant reconstruction. Patient underwent bilateral exchange of tissue expanders for implants, bilateral capsulotomies for implant repositioning and right breast capsulectomy with major repositioning of 3 x 8 cm of capsule on 06/06/2023. Patient had back surgery December 26th 2023 and then was diagnosed with breast cancer December 07, 2022.  Had surgery Jan 18, 2023 for her breast cancer, had double mastectomy. Patient had two sets of expanders, first one burst, the second one kept flipping and moving. Had to have more work on L side. PMH includes: anemia, anxiety, breast cancer, CKD, claustrophobia, depression, migraines, OA, PONV, sleep apnea, stroke, lumbar fusion, mastectomy.   PAIN:  Are you having pain? Yes: NPRS scale: 4/10 Pain location: L shoulder and breast Pain description: post surgical Aggravating factors: lift something  Relieving factors: none Worst: 7/10 Least: 2/10    PRECAUTIONS: Other: no lifting greater than 1/2 gallon   RED FLAGS: None   WEIGHT BEARING RESTRICTIONS:  no more than 1/2 gallon  FALLS:  Has patient fallen in last 6 months? Yes. Number of falls 1  LIVING ENVIRONMENT: Lives with: lives with their family Lives in: House/apartment Stairs: Yes: External: 2 steps; on right going up and on left going up Has following equipment at home: Single point cane, Walker - 2 wheeled, shower chair, and Grab bars  OCCUPATION: Disability   PLOF: Independent with basic ADLs  PATIENT GOALS:to decrease pain in L shoulder   NEXT MD VISIT:   OBJECTIVE:  Note: Objective measures were completed at Evaluation unless otherwise noted.  DIAGNOSTIC FINDINGS:  N/a   PATIENT SURVEYS:  FOTO 32  COGNITION: Overall cognitive status:  some short term memory      SENSATION: WFL  POSTURE: Rounding shoulders,   UPPER EXTREMITY ROM:   Active ROM Right eval Left eval  Shoulder flexion 115* 90  Shoulder extension 48 41  Shoulder abduction 81* 89  Shoulder adduction    Shoulder internal  rotation    Shoulder external rotation Unable to test in testing position Unable to test in testing position  (Blank rows = not tested) *  UPPER EXTREMITY MMT: Deferred at this time due to limited ROM and pain   MMT Right eval Left eval  Shoulder flexion    Shoulder extension    Shoulder abduction     Shoulder adduction    Shoulder internal rotation    Shoulder external rotation    Middle trapezius    Lower trapezius    Elbow flexion    Elbow extension    Wrist flexion    Wrist extension    Wrist ulnar deviation    Wrist radial deviation    Wrist pronation    Wrist supination    Grip strength (lbs)    (Blank rows = not tested)  SHOULDER SPECIAL TESTS: Deferred due to limited ROM   JOINT MOBILITY TESTING:  GH: hypomobile AP, PA, inferior glide bilateral   PALPATION:  Muscle guarding subscap, pectoral, glenohumeral region  Significant fluid on L flank    TODAY'S TREATMENT:                                                                                                                                         DATE: 07/31/23  Manual: On plinth PROM- L shoulder flexion, IR/ER, ABD, and  scaption within pain-free ranges with stretch in flex and abduction with 30 sec holds (2 reps of stretch for each movement) x multiple reps all other  movements  TE: PVC AAROM: LUE- ER 20x  Flexion 2 Abduction 15x - fatigued   Serratus punch 6x with PT assisting 4/5 reps, fully assisted for 2nd set of 6 reps - very fatiguing, pt reports feeling tightness in pec region   Supine isometric L shoulder ext into table 10x   Standing isometrics: R shoulder ER 2x10 IR 2x10 Flex 2x10 Ext 2x10  Updated HEP (See below)   PATIENT EDUCATION: Education details: exercise technique, HEP, decrease resistance with all HEP interventions as necessary to improve comfort and perform pain-free (push into towel roll as gently as needed), discontinue HEP until seen by PT if interventions cause increased pain Person educated: Patient Education method: Explanation, Demonstration, Tactile cues, Verbal cues, and Handouts Education comprehension: verbalized understanding, returned demonstration, verbal cues required, and tactile cues required  HOME EXERCISE PROGRAM:  11/26: updated Access Code:  [email or web address] URL: [email or web address] Date: 07/31/2023 Prepared by: Temple Pacini  Exercises - Isometric Shoulder Flexion at Wall  - 1 x daily - 7 x weekly - 2 sets - 10 reps - Isometric Shoulder Extension at Wall  - 1 x daily - 7 x weekly - 2 sets - 10 reps - Standing Isometric Shoulder Internal Rotation at Doorway  - 1 x daily - 7 x weekly - 2 sets - 10 reps - Standing Isometric Shoulder External Rotation with Doorway  -  1 x daily - 7 x weekly - 2 sets - 10 reps  Access Code: [email or web address] URL: [email or web address] Date: 07/02/2023 Prepared by: Temple Pacini  Exercises - Seated Shoulder Shrugs  - 2 x daily - 6-7 x weekly - 3 sets - 10 reps - Seated Shoulder Shrug Circles AROM Backward  - 2 x daily - 6-7 x weekly - 3 sets - 10 reps - Seated Scapular Retraction  - 1 x daily - 6-7 x weekly - 3 sets - 10 reps  ASSESSMENT:  CLINICAL IMPRESSION: Pt able to advance shoulder isometric HEP today, continues to tolerate isometrics well. AROM of LUE still somewhat pain-limited, particularly with abduction and flexion. Will continue to progress as pt able. Patient will benefit from skilled physical therapy to improve ROM and strength and decrease pain for improved quality of life.     OBJECTIVE IMPAIRMENTS: Abnormal gait, decreased endurance, decreased mobility, decreased ROM, decreased strength, hypomobility, increased edema, increased fascial restrictions, impaired perceived functional ability, increased muscle spasms, impaired flexibility, impaired UE functional use, improper body mechanics, postural dysfunction, and pain.   ACTIVITY LIMITATIONS: carrying, lifting, bed mobility, bathing, toileting, dressing, self feeding, reach over head, hygiene/grooming, and caring for others  PARTICIPATION LIMITATIONS: meal prep, cleaning, laundry, medication management, shopping, community activity, and yard work  PERSONAL FACTORS: Age, Fitness, Past/current experiences, Time since  onset of injury/illness/exacerbation, and 3+ comorbidities: anemia, anxiety, breast cancer, CKD, claustrophobia, depression, migraines, OA, PONV, sleep apnea, stroke, lumbar fusion, mastectomy.   are also affecting patient's functional outcome.   REHAB POTENTIAL: Good  CLINICAL DECISION MAKING: Evolving/moderate complexity  EVALUATION COMPLEXITY: Moderate   GOALS: Goals reviewed with patient? Yes  SHORT TERM GOALS: Target date: 07/25/2023    Patient will be independent in home exercise program to improve strength/mobility for better functional independence with ADLs. Baseline: 10/23: give next session  Goal status: INITIAL    LONG TERM GOALS: Target date: 09/19/2023    Patient will increase FOTO score to equal to or greater than 49    to demonstrate statistically significant improvement in mobility and quality of life.  Baseline: 32 Goal status: INITIAL  2.  Patient will report a worst pain of 3/10 on VAS in L shoulder  to improve tolerance with ADLs and reduced symptoms with activities.  Baseline: 7/10  Goal status: INITIAL  3.  Patient will improve shoulder AROM to > 140 degrees of flexion, scaption, and abduction for improved ability to perform overhead activities. Baseline: see above Goal status: INITIAL  4.  Patient will improve shoulder strength to 4/5 for improved functional ADL and iADL performance Baseline: 16/10: unable to perform today due to pain and ROM limitations Goal status: INITIAL  5. Patient will increase six minute walk test distance to >1000 for progression to community ambulator and improve gait ability  Baseline: 652 ft with SPC  Goal status: INITIAL   PLAN:  PT FREQUENCY: 2x/week  PT DURATION: 12 weeks  PLANNED INTERVENTIONS: 97164- PT Re-evaluation, 97110-Therapeutic exercises, 97530- Therapeutic activity, 97112- Neuromuscular re-education, 97535- Self Care, 96045- Manual therapy, 97116- Gait training, 97014- Electrical stimulation  (unattended), [phone]- Electrical stimulation (manual), [id number]- Traction (mechanical), Patient/Family education, Balance training, Taping, Joint mobilization, Spinal mobilization, Manual lymph drainage, Scar mobilization, Compression bandaging, Vestibular training, Visual/preceptual remediation/compensation, Cognitive remediation, Cryotherapy, Moist heat, and Biofeedback  PLAN FOR NEXT SESSION:  ROM Of shoulder, isometrics, progress HEP as pt able   Baird Kay, PT 07/31/2023, 3:47 PM

## 2023-08-04 ENCOUNTER — Other Ambulatory Visit: Payer: Self-pay | Admitting: Internal Medicine

## 2023-08-06 ENCOUNTER — Ambulatory Visit
Admission: RE | Admit: 2023-08-06 | Discharge: 2023-08-06 | Disposition: A | Discharge status: Home / Self Care | Payer: Medicaid Other | Source: Ambulatory Visit | Attending: Internal Medicine | Admitting: Internal Medicine

## 2023-08-06 ENCOUNTER — Inpatient Hospital Stay: Payer: Medicaid Other

## 2023-08-06 ENCOUNTER — Encounter: Payer: Self-pay | Admitting: Internal Medicine

## 2023-08-06 ENCOUNTER — Telehealth: Payer: Self-pay | Admitting: *Deleted

## 2023-08-06 ENCOUNTER — Inpatient Hospital Stay: Payer: Medicaid Other | Attending: Internal Medicine | Admitting: Internal Medicine

## 2023-08-06 ENCOUNTER — Ambulatory Visit: Payer: Medicaid Other | Attending: Student

## 2023-08-06 VITALS — BP 135/80 | HR 70 | Temp 97.7°F | Wt 261.0 lb

## 2023-08-06 DIAGNOSIS — M5459 Other low back pain: Secondary | ICD-10-CM | POA: Insufficient documentation

## 2023-08-06 DIAGNOSIS — R2681 Unsteadiness on feet: Secondary | ICD-10-CM | POA: Diagnosis present

## 2023-08-06 DIAGNOSIS — R262 Difficulty in walking, not elsewhere classified: Secondary | ICD-10-CM | POA: Diagnosis present

## 2023-08-06 DIAGNOSIS — G8929 Other chronic pain: Secondary | ICD-10-CM | POA: Insufficient documentation

## 2023-08-06 DIAGNOSIS — M549 Dorsalgia, unspecified: Secondary | ICD-10-CM | POA: Insufficient documentation

## 2023-08-06 DIAGNOSIS — M6281 Muscle weakness (generalized): Secondary | ICD-10-CM

## 2023-08-06 DIAGNOSIS — M7989 Other specified soft tissue disorders: Secondary | ICD-10-CM | POA: Diagnosis not present

## 2023-08-06 DIAGNOSIS — C50812 Malignant neoplasm of overlapping sites of left female breast: Secondary | ICD-10-CM | POA: Diagnosis present

## 2023-08-06 DIAGNOSIS — R600 Localized edema: Secondary | ICD-10-CM | POA: Insufficient documentation

## 2023-08-06 DIAGNOSIS — Z79810 Long term (current) use of selective estrogen receptor modulators (SERMs): Secondary | ICD-10-CM | POA: Diagnosis not present

## 2023-08-06 DIAGNOSIS — Z170 Estrogen receptor positive status [ER+]: Secondary | ICD-10-CM | POA: Diagnosis not present

## 2023-08-06 DIAGNOSIS — M25512 Pain in left shoulder: Secondary | ICD-10-CM | POA: Insufficient documentation

## 2023-08-06 DIAGNOSIS — M545 Low back pain, unspecified: Secondary | ICD-10-CM

## 2023-08-06 DIAGNOSIS — Z9013 Acquired absence of bilateral breasts and nipples: Secondary | ICD-10-CM | POA: Diagnosis not present

## 2023-08-06 DIAGNOSIS — M25612 Stiffness of left shoulder, not elsewhere classified: Secondary | ICD-10-CM

## 2023-08-06 LAB — CMP (CANCER CENTER ONLY)
ALT: 21 U/L (ref 0–44)
AST: 22 U/L (ref 15–41)
Albumin: 3.9 g/dL (ref 3.5–5.0)
Alkaline Phosphatase: 78 U/L (ref 38–126)
Anion gap: 9 (ref 5–15)
BUN: 11 mg/dL (ref 6–20)
CO2: 22 mmol/L (ref 22–32)
Calcium: 8.6 mg/dL — ABNORMAL LOW (ref 8.9–10.3)
Chloride: 111 mmol/L (ref 98–111)
Creatinine: 0.9 mg/dL (ref 0.44–1.00)
GFR, Estimated: >60 mL/min (ref >60)
Glucose, Bld: 94 mg/dL (ref 70–99)
Potassium: 3.6 mmol/L (ref 3.5–5.1)
Sodium: 142 mmol/L (ref 135–145)
Total Bilirubin: 0.6 mg/dL (ref <1.2)
Total Protein: 7.4 g/dL (ref 6.5–8.1)

## 2023-08-06 LAB — CBC WITH DIFFERENTIAL (CANCER CENTER ONLY)
Abs Immature Granulocytes: 0.01 10*3/uL (ref 0.00–0.07)
Basophils Absolute: 0 10*3/uL (ref 0.0–0.1)
Basophils Relative: 1 %
Eosinophils Absolute: 0.1 10*3/uL (ref 0.0–0.5)
Eosinophils Relative: 2 %
HCT: 36.2 % (ref 36.0–46.0)
Hemoglobin: 11.7 g/dL — ABNORMAL LOW (ref 12.0–15.0)
Immature Granulocytes: 0 %
Lymphocytes Relative: 40 %
Lymphs Abs: 1.4 10*3/uL (ref 0.7–4.0)
MCH: 32.1 pg (ref 26.0–34.0)
MCHC: 32.3 g/dL (ref 30.0–36.0)
MCV: 99.5 fL (ref 80.0–100.0)
Monocytes Absolute: 0.3 10*3/uL (ref 0.1–1.0)
Monocytes Relative: 8 %
Neutro Abs: 1.7 10*3/uL (ref 1.7–7.7)
Neutrophils Relative %: 49 %
Platelet Count: 279 10*3/uL (ref 150–400)
RBC: 3.64 MIL/uL — ABNORMAL LOW (ref 3.87–5.11)
RDW: 13.1 % (ref 11.5–15.5)
WBC Count: 3.6 10*3/uL — ABNORMAL LOW (ref 4.0–10.5)
nRBC: 0 % (ref 0.0–0.2)

## 2023-08-06 MED ORDER — TAMOXIFEN CITRATE 20 MG PO TABS: 20.0000 mg | ORAL_TABLET | Freq: Every day | ORAL | 0 refills | Status: DC

## 2023-08-06 NOTE — Therapy (Signed)
OUTPATIENT PHYSICAL THERAPY SHOULDER and ORTHO TREATMENT NOTE   Patient Name: Vanessa Medina MRN: [id number] DOB:1970-12-01, 52 y.o., female Today's Date: 08/06/2023  END OF SESSION:  PT End of Session - 08/06/23 0807     Visit Number 8    Number of Visits 24    Date for PT Re-Evaluation 09/19/23    Progress Note Due on Visit 10    PT Start Time 0805    PT Stop Time 0844    PT Time Calculation (min) 39 min    Activity Tolerance Patient tolerated treatment well;Patient limited by pain    Behavior During Therapy [facility] for tasks assessed/performed                   Past Medical History:  Diagnosis Date   Acute nontraumatic kidney injury (HCC)    Anemia    Anxiety    Benign cyst of kidney    Breast cancer (HCC) 01/18/2023   left breast   Cervical cancer (HCC)    Chronic hypotension    Chronic kidney disease    Previous now clear   Claustrophobia    Decompression injury of spinal cord    Depression    Family history of adverse reaction to anesthesia     " my mother takes a long time time wake up"   History of dysplastic nevus 01/18/2018   right shoulder, recurrent dysplastic nevus   History of kidney stones    History of shingles    Migraine    Multiple allergies    Obesity    Osteoarthritis    left hip   Pneumonia    PONV (postoperative nausea and vomiting)    slow to wake up   Sleep apnea    does not wear CPAP   Slow to wake up after anesthesia    Stroke (HCC) 08/11/2020   spinal cord stroke, spinal cord puncture during surgery   Wears glasses    Past Surgical History:  Procedure Laterality Date   ABDOMINAL HYSTERECTOMY     APPENDECTOMY     BACK SURGERY     BILIOPANCREATIC DIVISION W DUODENAL SWITCH N/A    BREAST BIOPSY Left 12/06/2022   u/s bx,1:00 heart clip, path pending   BREAST BIOPSY Left 12/06/2022   [name] bx 2:00 ribbon clip path pending   BREAST BIOPSY Left 12/06/2022   [name] LT BREAST BX W LOC DEV EA ADD LESION IMG BX SPEC US GUIDE  12/06/2022 [facility]   BREAST BIOPSY Left 12/06/2022   [name] LT BREAST BX W LOC DEV 1ST LESION IMG BX SPEC US GUIDE 12/06/2022 [facility]   BREAST RECONSTRUCTION WITH PLACEMENT OF TISSUE EXPANDER AND FLEX HD (ACELLULAR HYDRATED DERMIS) Bilateral 01/18/2023   Procedure: IMMEDIATE LEFT BREAST RECONSTRUCTION WITH PLACEMENT OF TISSUE EXPANDER AND FLEX HD (ACELLULAR HYDRATED DERMIS);  Surgeon: Peggye Form, DO;  Location: [facility];  Service: Plastics;  Laterality: Bilateral;   BREAST RECONSTRUCTION WITH PLACEMENT OF TISSUE EXPANDER AND FLEX HD (ACELLULAR HYDRATED DERMIS) Left 03/26/2023   Procedure: BREAST RECONSTRUCTION WITH PLACEMENT OF TISSUE EXPANDER;  Surgeon: Peggye Form, DO;  Location: [facility];  Service: Plastics;  Laterality: Left;   CHOLECYSTECTOMY     COLONOSCOPY N/A 06/27/2021   Procedure: COLONOSCOPY;  Surgeon: Jaynie Collins, DO;  Location: [facility];  Service: Gastroenterology;  Laterality: N/A;   DIAGNOSTIC LAPAROSCOPY     LOA   ESOPHAGOGASTRODUODENOSCOPY N/A 06/27/2021   Procedure: ESOPHAGOGASTRODUODENOSCOPY (EGD);  Surgeon: Jaynie Collins,  DO;  Location: [facility];  Service: Gastroenterology;  Laterality: N/A;   ESOPHAGOGASTRODUODENOSCOPY (EGD) WITH PROPOFOL N/A 07/25/2017   Procedure: ESOPHAGOGASTRODUODENOSCOPY (EGD) WITH PROPOFOL;  Surgeon: Scot Jun, MD;  Location: [facility];  Service: Endoscopy;  Laterality: N/A;   FLEXIBLE BRONCHOSCOPY     HERNIA REPAIR     KNEE ARTHROSCOPY     LAPAROSCOPIC GASTRIC SLEEVE RESECTION WITH HIATAL HERNIA REPAIR     LUMBAR FUSION  08/29/2022   Revision Lumbar two through five Laminectomy & Fusion with Transforaminal Lumbar interbody fusion   MASTECTOMY W/ SENTINEL NODE BIOPSY Bilateral 01/18/2023   Procedure: MASTECTOMY WITH SENTINEL LYMPH NODE BIOPSY, RNFA to assist;  Surgeon: Leafy Ro, MD;  Location: [facility];  Service: General;  Laterality: Bilateral;   REMOVAL  OF TISSUE EXPANDER AND PLACEMENT OF IMPLANT Bilateral 06/06/2023   Procedure: REMOVAL OF TISSUE EXPANDER AND PLACEMENT OF IMPLANT;  Surgeon: Peggye Form, DO;  Location:  SURGERY CENTER;  Service: Plastics;  Laterality: Bilateral;   SPINAL CORD STIMULATOR IMPLANT     TONSILLECTOMY     TOTAL HIP ARTHROPLASTY Left 01/26/2016   Procedure: LEFT TOTAL HIP ARTHROPLASTY ANTERIOR APPROACH;  Surgeon: Tarry Kos, MD;  Location: [facility];  Service: Orthopedics;  Laterality: Left;   TRANSFORAMINAL LUMBAR INTERBODY FUSION (TLIF) WITH PEDICLE SCREW FIXATION 3 LEVEL  08/2019   Vira Browns, MD L2-L5   Patient Active Problem List   Diagnosis Date Noted   Long-term current use of tamoxifen 06/26/2023   Claustrophobia    Pernicious anemia 03/14/2023   Ruptured left breast implant 02/13/2023   S/P mastectomy, bilateral 01/18/2023   Genetic testing 12/18/2022   Breast cancer (HCC) 12/08/2022   Family history of breast cancer 12/08/2022   Profound fatigue 12/08/2022   Lumbar post-laminectomy syndrome 06/02/2022   Pseudarthrosis after fusion or arthrodesis 05/19/2022   Primary osteoarthritis of right knee 03/28/2022   Subjective tinnitus of both ears 12/21/2021   Abnormality of gait 04/29/2021   Abnormal sensation in both ears 04/06/2021   Other adverse food reactions, not elsewhere classified, subsequent encounter 04/06/2021   Multiple drug allergies 04/06/2021   Insomnia due to medical condition 02/25/2021   Myofascial muscle pain 02/25/2021   Nerve pain 10/18/2020   Incomplete paraplegia (HCC) 10/18/2020   Orthostatic hypotension 10/18/2020   Renal cyst 09/23/2020   Chronic migraine without aura without status migrainosus, not intractable    Hypokalemia    Adjustment reaction with anxiety and depression    Spinal cord injury, lumbar, without spinal bone injury, sequela (HCC) 08/18/2020   Paraparesis (HCC)    Post-operative pain    Hypotension    Slow transit constipation    AKI  (acute kidney injury) (HCC)    Right leg weakness 08/11/2020   DDD (degenerative disc disease), lumbar 09/02/2019    Class: Chronic   Degenerative disc disease, lumbar 09/02/2019   Chest tightness 09/23/2018   Bradycardia 09/23/2018   BMI 40.0-44.9, adult (HCC) 04/17/2018   Status post bariatric surgery 04/17/2018   Labile blood pressure 03/08/2018   Chronic low back pain 09/03/2017   History of total hip replacement, left 01/26/2016   Osteoarthritis of spine with radiculopathy, lumbar region 10/16/2014   EDEMA 04/28/2008   LEG PAIN, BILATERAL 12/16/2007   Malignant neoplasm of cervix uteri (HCC) 07/02/2007   MORBID OBESITY 07/02/2007   DEPRESSION 07/02/2007   Migraine without aura 07/02/2007   Other allergic rhinitis 07/02/2007   Asthma 07/02/2007   GERD 07/02/2007   ELEVATED BLOOD  PRESSURE WITHOUT DIAGNOSIS OF HYPERTENSION 07/02/2007   Asthma 07/02/2007    PCP: [name] MD  REFERRING PROVIDER: Caroline More  REFERRING DIAG: s/p breast reconstruction  THERAPY DIAG:  Stiffness of left shoulder, not elsewhere classified  Muscle weakness (generalized)  Difficulty in walking, not elsewhere classified  Rationale for Evaluation and Treatment: Rehabilitation  ONSET DATE: December 07 2022  SUBJECTIVE:                                                                                                                                                                                      SUBJECTIVE STATEMENT: Pt ambulating with cane today, reports something is going on with her legs, plans to see doctor after this appointment at the cancer center. She reports her BLE hurts, she is experiencing increased swelling in BLE and has been dealing with this for a week. Her energy levels have been low.  Hand dominance: Right  PERTINENT HISTORY: Patient is having LUE ROM issues s/p breast cancer implant reconstruction. Patient underwent bilateral exchange of tissue expanders for  implants, bilateral capsulotomies for implant repositioning and right breast capsulectomy with major repositioning of 3 x 8 cm of capsule on 06/06/2023. Patient had back surgery December 26th 2023 and then was diagnosed with breast cancer December 07, 2022. Had surgery Jan 18, 2023 for her breast cancer, had double mastectomy. Patient had two sets of expanders, first one burst, the second one kept flipping and moving. Had to have more work on L side. PMH includes: anemia, anxiety, breast cancer, CKD, claustrophobia, depression, migraines, OA, PONV, sleep apnea, stroke, lumbar fusion, mastectomy.   PAIN:  Are you having pain? Yes: NPRS scale: 4/10 Pain location: L shoulder and breast Pain description: post surgical Aggravating factors: lift something  Relieving factors: none Worst: 7/10 Least: 2/10    PRECAUTIONS: Other: no lifting greater than 1/2 gallon  , pt reports LATEX ALLERGY   RED FLAGS: None   WEIGHT BEARING RESTRICTIONS:  no more than 1/2 gallon  FALLS:  Has patient fallen in last 6 months? Yes. Number of falls 1  LIVING ENVIRONMENT: Lives with: lives with their family Lives in: House/apartment Stairs: Yes: External: 2 steps; on right going up and on left going up Has following equipment at home: Single point cane, Walker - 2 wheeled, shower chair, and Grab bars  OCCUPATION: Disability   PLOF: Independent with basic ADLs  PATIENT GOALS:to decrease pain in L shoulder   NEXT MD VISIT:   OBJECTIVE:  Note: Objective measures were completed at Evaluation unless otherwise noted.  DIAGNOSTIC FINDINGS:  N/a   PATIENT SURVEYS:  FOTO 32  COGNITION: Overall cognitive status:  some  short term memory      SENSATION: WFL  POSTURE: Rounding shoulders,   UPPER EXTREMITY ROM:   Active ROM Right eval Left eval  Shoulder flexion 115* 90  Shoulder extension 48 41  Shoulder abduction 81* 89  Shoulder adduction    Shoulder internal rotation    Shoulder external rotation  Unable to test in testing position Unable to test in testing position  (Blank rows = not tested) *  UPPER EXTREMITY MMT: Deferred at this time due to limited ROM and pain   MMT Right eval Left eval  Shoulder flexion    Shoulder extension    Shoulder abduction    Shoulder adduction    Shoulder internal rotation    Shoulder external rotation    Middle trapezius    Lower trapezius    Elbow flexion    Elbow extension    Wrist flexion    Wrist extension    Wrist ulnar deviation    Wrist radial deviation    Wrist pronation    Wrist supination    Grip strength (lbs)    (Blank rows = not tested)  SHOULDER SPECIAL TESTS: Deferred due to limited ROM   JOINT MOBILITY TESTING:  GH: hypomobile AP, PA, inferior glide bilateral   PALPATION:  Muscle guarding subscap, pectoral, glenohumeral region  Significant fluid on L flank    TODAY'S TREATMENT:                                                                                                                                         DATE: 08/06/23  Manual: x 5 min On plinth PROM- L shoulder flexion and scaption within pain-free ranges with stretch x multiple reps each. Able to complete pain-free   TE: Seated bilat shoulder abduction AROM with 90 deg elbow flexion 2x10 - feels tight Seated bilat shoulder scaption 1x10 bilat UE  Supine chest press with 1.5# bar 10x  - feels tightness  YTB (latex-free) supine shoulder ER 15x bilat -  no pain    PVC AAROM: Flexion 15x bilat UE - rates easy, then repeated 8x with 1.5# bar   RUE: 130/81 mmHg (96) HR 65 - assessed as pt presents with increased BLE swelling today and reports headaches. She reports BP has been taken on her RUE since surgeries. Not assessed at LE due to significant swelling.  Wall ladder  LUE flexion 2x with 30 sec holds at top  LUE: Table slide flexion 10x with 30 sec hold last rep Table slide abduction 10x with 30 sec hold last rep  Comments: perform in pain-free  range, to promote stretch and increased ROM  Seated YTB (latex-free) shoulder ER 15x bilat  Seated YTB (latex-free) rows 10x bilat  Added to HEP: Access Code: [email or web address] URL: [email or web address] Date: 08/06/2023 Prepared by: Temple Pacini  Exercises - Seated Shoulder Flexion Towel Slide at Table Top  - 1 x  daily - 5-7 x weekly - 2 sets - 10 reps - 15-30 sec hold - Seated Shoulder Abduction Towel Slide at Table Top  - 1 x daily - 5-7 x weekly - 2 sets - 10 reps - 15-30 sec hold    PATIENT EDUCATION: Education details: exercise technique, update to HEP, Person educated: Patient Education method: Explanation, Demonstration, Tactile cues, Verbal cues, and Handouts Education comprehension: verbalized understanding, returned demonstration, verbal cues required, and tactile cues required  HOME EXERCISE PROGRAM:  12/2: Access Code: [email or web address] URL: [email or web address] Date: 08/06/2023 Prepared by: Temple Pacini  Exercises - Seated Shoulder Flexion Towel Slide at Table Top  - 1 x daily - 5-7 x weekly - 2 sets - 10 reps - 15-30 sec hold - Seated Shoulder Abduction Towel Slide at Table Top  - 1 x daily - 5-7 x weekly - 2 sets - 10 reps - 15-30 sec hold  11/26: updated Access Code: [email or web address] URL: [email or web address] Date: 07/31/2023 Prepared by: Temple Pacini  Exercises - Isometric Shoulder Flexion at Wall  - 1 x daily - 7 x weekly - 2 sets - 10 reps - Isometric Shoulder Extension at Wall  - 1 x daily - 7 x weekly - 2 sets - 10 reps - Standing Isometric Shoulder Internal Rotation at Doorway  - 1 x daily - 7 x weekly - 2 sets - 10 reps - Standing Isometric Shoulder External Rotation with Doorway  - 1 x daily - 7 x weekly - 2 sets - 10 reps  Access Code: [email or web address] URL: [email or web address] Date: 07/02/2023 Prepared by: Temple Pacini  Exercises - Seated Shoulder Shrugs  - 2 x daily - 6-7 x weekly - 3 sets - 10 reps - Seated  Shoulder Shrug Circles AROM Backward  - 2 x daily - 6-7 x weekly - 3 sets - 10 reps - Seated Scapular Retraction  - 1 x daily - 6-7 x weekly - 3 sets - 10 reps  ASSESSMENT:  CLINICAL IMPRESSION: Pt advanced to gentle latex-free YTB shoulder ER and rows today without pain, but did report LUE still feels stiff. Pt also presented with increased BLE swelling, going to her doctor's office to see if she can be seen following today's appointment. PT provided info about urgent care located adjacent to clinic if pt not able to be seen by her physician. BP assessed and was WNL. Patient will benefit from skilled physical therapy to improve ROM and strength and decrease pain for improved quality of life.     OBJECTIVE IMPAIRMENTS: Abnormal gait, decreased endurance, decreased mobility, decreased ROM, decreased strength, hypomobility, increased edema, increased fascial restrictions, impaired perceived functional ability, increased muscle spasms, impaired flexibility, impaired UE functional use, improper body mechanics, postural dysfunction, and pain.   ACTIVITY LIMITATIONS: carrying, lifting, bed mobility, bathing, toileting, dressing, self feeding, reach over head, hygiene/grooming, and caring for others  PARTICIPATION LIMITATIONS: meal prep, cleaning, laundry, medication management, shopping, community activity, and yard work  PERSONAL FACTORS: Age, Fitness, Past/current experiences, Time since onset of injury/illness/exacerbation, and 3+ comorbidities: anemia, anxiety, breast cancer, CKD, claustrophobia, depression, migraines, OA, PONV, sleep apnea, stroke, lumbar fusion, mastectomy.   are also affecting patient's functional outcome.   REHAB POTENTIAL: Good  CLINICAL DECISION MAKING: Evolving/moderate complexity  EVALUATION COMPLEXITY: Moderate   GOALS: Goals reviewed with patient? Yes  SHORT TERM GOALS: Target date: 07/25/2023    Patient will be independent in home exercise program to improve  strength/mobility for better functional independence with ADLs. Baseline: 10/23: give next session  Goal status: INITIAL    LONG TERM GOALS: Target date: 09/19/2023    Patient will increase FOTO score to equal to or greater than 49    to demonstrate statistically significant improvement in mobility and quality of life.  Baseline: 32 Goal status: INITIAL  2.  Patient will report a worst pain of 3/10 on VAS in L shoulder  to improve tolerance with ADLs and reduced symptoms with activities.  Baseline: 7/10  Goal status: INITIAL  3.  Patient will improve shoulder AROM to > 140 degrees of flexion, scaption, and abduction for improved ability to perform overhead activities. Baseline: see above Goal status: INITIAL  4.  Patient will improve shoulder strength to 4/5 for improved functional ADL and iADL performance Baseline: 84/16: unable to perform today due to pain and ROM limitations Goal status: INITIAL  5. Patient will increase six minute walk test distance to >1000 for progression to community ambulator and improve gait ability  Baseline: 652 ft with SPC  Goal status: INITIAL   PLAN:  PT FREQUENCY: 2x/week  PT DURATION: 12 weeks  PLANNED INTERVENTIONS: 97164- PT Re-evaluation, 97110-Therapeutic exercises, 97530- Therapeutic activity, 97112- Neuromuscular re-education, 97535- Self Care, 60630- Manual therapy, 97116- Gait training, 97014- Electrical stimulation (unattended), [id number]- Electrical stimulation (manual), [id number]- Traction (mechanical), Patient/Family education, Balance training, Taping, Joint mobilization, Spinal mobilization, Manual lymph drainage, Scar mobilization, Compression bandaging, Vestibular training, Visual/preceptual remediation/compensation, Cognitive remediation, Cryotherapy, Moist heat, and Biofeedback  PLAN FOR NEXT SESSION:  ROM Of shoulder, isometrics, progress HEP as pt able   Baird Kay, PT 08/06/2023, 9:58 AM

## 2023-08-06 NOTE — Progress Notes (Signed)
[facility] CONSULT NOTE  Patient Care Team: [name], MD as PCP - General (Family Medicine) [name], [name], MD as PCP - Cardiology (Cardiology) [name], MD as Consulting Physician (Cardiology) [name], DO as Consulting Physician (Neurology) [name], RN as Oncology Nurse Navigator [name], MD as Consulting Physician (Oncology)  CANCER STAGING   Cancer Staging  Breast cancer [facility]) Staging form: Breast, AJCC 8th Edition - Clinical stage from 12/06/2022: Stage IA (cT1b, cN0, cM0, G2, ER+, PR+, HER2-) - Signed by [name], MD on 02/15/2023 Stage prefix: Initial diagnosis Histologic grading system: 3 grade system   ASSESSMENT & PLAN:  Vanessa Medina 52 y.o. female with pmh of Right-sided hemiparesis as complication from permanent spinal stimulator placement, orthostasis on midodrine and fludrocortisone, hypertension, migraine, GERD, cholecystectomy, lumbar fusion in December 2023, gastric sleeve was referred to medical oncology for newly diagnosed left breast hormone positive cancer.  # Bilateral lower extremity swelling -Started 1 week ago. -Considering patient is on tamoxifen, I will obtain venous Dopplers to rule out blood clot.  Patient denies any shortness of breath or pleuritic chest pain.  Until Dopplers, I have requested patient to hold off on tamoxifen.  Continue with leg elevation.  If Dopplers come back negative, can consider compression stockings.  She has mild renal insufficiency and follows with Dr. Thedore Mins.  Denies any cardiac history.  CBC and CMP today.  # Left breast IDC, multifocal, ER/PR positive, HER2 negative -The mass was detected on screening mammogram.  Imaging showing 3 masses. Mass 1 at 1:00 3 cm from the nipple measuring 7 x by 8 x 8 mm.  Mass 2 1:30 o'clock 1 cm from nipple measuring 9 x 7 x 9 mm.  Mass 3 at 2:00 1 cm from nipple measuring 6 x 8 x 4 mm.  No suspicious left axillary lymph node 1.  Mass 1 and  mass 3 was biopsied consistent with invasive ductal cancer.  Both are strongly expressing ER and PR receptors.  HER2 negative.  -Status post bilateral mastectomies with SLNB left axilla and immediate bilateral reconstruction on 01/18/2023.  Pathology showed multifocal disease 3 separate foci- 12 mm with grade 1, 12 mm with grade 2 and 9 mm with grade 2 invasive mucinous. margins negative, 1/2 LN micrometastatic dz, 0.6 mm deposit, ER/PR >90% positive. HER2 negative.   -Oncotype DX score was 9.  No role for adjuvant chemotherapy. -No role for adjuvant radiation.  -Patient decided to proceed with tamoxifen over aromatase inhibitor due to a side effect of osteoporosis.  Started on tamoxifen on 03/02/2023.  Overall has been tolerating well.  Myberitq has been switched to tolterodine due to interaction with tamoxifen.  Duloxetine was tapered off due to interaction with tamoxifen decreasing its efficacy.  However, patient has been having increasing dizziness, lightheadedness since being off duloxetine.  It is affecting her quality of life.  I discussed about going back on her usual dose of 60 mg.  -She had multiple issues with tissue expanders.  Now has implant placed.  Follows with Dr. Ulice Bold.  # Lower back pain -Patient has longstanding history of spine surgeries.  6 weeks ago she had a mechanical fall and hit hard on her lower back.  Having pain since with no improvement.  Cannot see her prior spinal surgeon Dr. Verlin Dike since insurance does not accept.  I will obtain x-ray of thoracic and lumbar spine AP lateral view to start with.  Patient will look into other  spine surgeons that she can see under insurance and will let us know if she needs a referral.  # Right apical nodule -Detected on breast cancer staging.  CT chest without contrast showed 4 mm right apical nodule.   -Repeat CT scan showed stable right apical nodule likely benign calcified granuloma.  No further follow-up is recommended.  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  # Extreme fatigue -Iron panel and TSH was normal.  # Right-sided hemiparesis -Due to acute spinal cord injury during spinal stimulator placement.  MRI brain from October 2023 with no evidence of stroke  # Chronic back pain status post lumbar fusion -On duloxetine, muscle relaxers  # Orthostasis -Chronic.  On midodrine and fludrocortisone  #s/p gastric sleeve procedure for weight loss  # Personal history of cervical cancer s/p cryotherapy.  Tells me gets regular Pap smears.   Orders Placed This Encounter  Procedures   US Venous Img Lower Bilateral    Standing Status:   Future    Number of Occurrences:   1    Standing Expiration Date:   08/05/2024    Order Specific Question:   Reason for Exam (SYMPTOM  OR DIAGNOSIS REQUIRED)    Answer:   Leg swelling. Rule out clot.    Order Specific Question:   Preferred imaging location?    Answer:   [facility]   DG Lumbar Spine 2-3 Views    Xray thoracic and lumbar spine AP/lateral 2-3 views    Standing Status:   Future    Number of Occurrences:   1    Standing Expiration Date:   08/05/2024    Order Specific Question:   Reason for Exam (SYMPTOM  OR DIAGNOSIS REQUIRED)    Answer:   Swelling    Order Specific Question:   Is patient pregnant?    Answer:   No    Order Specific Question:   Preferred imaging location?    Answer:   [facility]   DG Thoracic Spine 2 View    Xray thoracic and lumbar spine AP/lateral 2-3 views    Standing Status:   Future    Number of Occurrences:   1    Standing Expiration Date:   08/05/2024    Order Specific Question:   Reason for Exam (SYMPTOM  OR DIAGNOSIS REQUIRED)    Answer:   Swelling    Order Specific Question:   Is patient pregnant?    Answer:   No    Order Specific Question:   Preferred imaging location?    Answer:   [facility]   CBC with Differential  (Cancer Center Only)    Standing Status:   Future    Number of Occurrences:   1    Standing Expiration Date:   08/05/2024   CMP (Cancer Center only)    Standing Status:   Future    Number of Occurrences:   1    Standing Expiration Date:   08/05/2024   RTC in 6 months for MD visit, lab  The total time spent in the appointment was 30 minutes encounter with patients including review of chart and various tests results, discussions about plan of care and coordination of care plan   All questions were answered. The patient knows to call the clinic with any problems, questions or concerns. No barriers to learning was detected.  [name], MD 12/2/20243:19 PM   HISTORY OF PRESENTING ILLNESS:  Vanessa Medina 52 y.o. female with pmh of Right-sided hemiparesis as complication from permanent spinal stimulator placement, orthostasis on midodrine and fludrocortisone, hypertension, migraine, GERD, cholecystectomy, lumbar fusion in December 2023, gastric sleeve was referred to medical oncology for newly diagnosed left breast hormone positive cancer.  Interval history Patient was seen today for symptom management as an acute visit. Reports bilateral lower extremity swelling for a week now.  Difficulty fitting her shoes.  Causing pain.  Denies any shortness of breath or chest pain.  Having muscular pain because of the physical therapy that she is undergoing post plastic surgery with her mastectomy.  She also reports a mechanical fall about 6 weeks ago when she landed hard on her back and since then has been having pain.  She is worried with her history of multiple spine surgeries if it has damaged anything.  Cannot see her prior spine surgeon because the insurance would not excepted.  Looking for a new one.  Also having issues with bad nasal congestion and bilateral facial pain and tenderness.  She is on Flonase, Singulair and Claritin which was previously helping but not so much since the past few weeks.   Was previously following with allergist in [place] but now looking for a new allergist locally.  I have reviewed her chart and materials related to her cancer extensively and collaborated history with the patient. Summary of oncologic history is as follows: Oncology History  Breast cancer (HCC)  11/23/2022 Mammogram   Screening mammogram-   FINDINGS: In the left breast, possible masses warrant further evaluation. In the right breast, no findings suspicious for malignancy.  Diagnostic mammogram and [name] (11/29/2022) Mass 1:   At 1 o'clock 3 cm from the nipple, there is an irregular hypoechoic mass with irregular margins. There is associated sonographic architectural distortion. It measures approximately 8 x 7 by 8 mm.   Mass 2:   At 1:30 1 cm from the nipple, there is an oval mildly hypoechoic mass with irregular margins. It measures 9 x 7 by 9 mm.   Mass 3:   At 2 o'clock 1 cm from the nipple, there is an oval hypoechoic mass with mildly angular margins. It measures 6 by 8 x 4 mm.   There is approximately 2.1 cm between mass 1 and mass 3. Masses 1-3 span approximately 3.3 cm.   Targeted ultrasound was performed of the LEFT axilla. No suspicious axillary lymph nodes are visualized.   IMPRESSION: 1. There are 3 masses which extend along a ductal ray of the LEFT breast. One of these masses (mass 1) is a highly suspicious 8 mm mass at the site of screening mammographic concern at 1 o'clock 3 cm from the nipple with associated architectural distortion. The 2 other masses extend anteriorly from mass 1 towards the nipple and span in total 3.7 cm. Recommend 2 site ultrasound-guided biopsy for definitive characterization with preferential sampling of mass 1.  2. No suspicious LEFT axillary adenopathy.   12/06/2022 Initial Biopsy   DIAGNOSIS: A. BREAST, LEFT AT 1:00, 3 CM FROM THE NIPPLE; ULTRASOUND-GUIDED CORE NEEDLE BIOPSY: - INVASIVE MAMMARY CARCINOMA, NO SPECIAL  TYPE.  Size of invasive carcinoma: 12 mm in this sample Histologic grade of invasive carcinoma: Grade 1                      Glandular/tubular differentiation score: 2  Nuclear pleomorphism score: 2                      Mitotic rate score: 1                      Total score: 5 Ductal carcinoma in situ: Present, intermediate grade Lymphovascular invasion: Not identified  LEFT AT 1:00, 3 CM FN;  HEART-SHAPED CLIP  Estrogen Receptor (ER) Status: POSITIVE          Percentage of cells with nuclear positivity: Greater than 90%          Average intensity of staining: Strong   Progesterone Receptor (PgR) Status: POSITIVE          Percentage of cells with nuclear positivity: Greater than 90%          Average intensity of staining: Strong   HER2 (by immunohistochemistry): NEGATIVE (Score 1+)   B. BREAST, LEFT AT 2:00, 1 CM FROM THE NIPPLE; ULTRASOUND-GUIDED CORE NEEDLE BIOPSY: - INVASIVE MAMMARY CARCINOMA, WITH MUCINOUS FEATURES.  Estrogen Receptor (ER) Status: POSITIVE          Percentage of cells with nuclear positivity: Greater than 90%          Average intensity of staining: Strong   Progesterone Receptor (PgR) Status: POSITIVE          Percentage of cells with nuclear positivity: Greater than 90%          Average intensity of staining: Strong   HER2 (by immunohistochemistry): NEGATIVE (Score 0)  Size of invasive carcinoma: 4 mm in this sample Histologic grade of invasive carcinoma: Grade 2                      Glandular/tubular differentiation score: 3                      Nuclear pleomorphism score: 2                      Mitotic rate score: 1                      Total score: 6 Ductal carcinoma in situ: Not identified Lymphovascular invasion: Not identified     12/07/2022 Cancer Staging   Staging form: Breast, AJCC 8th Edition - Clinical stage from 12/06/2022: Stage IA (cT1b, cN0, cM0, G2, ER+, PR+, HER2-) - Signed by [name], MD on  12/08/2022 Stage prefix: Initial diagnosis Histologic grading system: 3 grade system     Genetic Testing   No pathogenic variants identified on the [facility] Hereditary Breast Cancer STAT+ Multi-Cancer+RNA panel. VUS in POLD1 called c.964C>T identified. The report date is 12/15/2022.  The Multi-Cancer + RNA Panel offered by [facility] includes sequencing and/or deletion/duplication analysis of the following 70 genes:  AIP*, ALK, APC*, ATM*, AXIN2*, BAP1*, BARD1*, BLM*, BMPR1A*, BRCA1*, BRCA2*, BRIP1*, CDC73*, CDH1*, CDK4, CDKN1B*, CDKN2A, CHEK2*, CTNNA1*, DICER1*, EPCAM, EGFR, FH*, FLCN*, GREM1, HOXB13, KIT, LZTR1, MAX*, MBD4, MEN1*, MET, MITF, MLH1*, MSH2*, MSH3*, MSH6*, MUTYH*, NF1*, NF2*, NTHL1*, PALB2*, PDGFRA, PMS2*, POLD1*, POLE*, POT1*, PRKAR1A*, PTCH1*, PTEN*, RAD51C*, RAD51D*, RB1*, RET, SDHA*, SDHAF2*, SDHB*, SDHC*, SDHD*, SMAD4*, SMARCA4*, SMARCB1*, SMARCE1*, STK11*, SUFU*, TMEM127*, TP53*, TSC1*, TSC2*, VHL*. RNA analysis is performed for * genes.   01/18/2023 Definitive Surgery   Bilateral mastectomy with left axilla SLNB and immediate breast reconstruction by Dr. Everlene Farrier and Dr. Ulice Bold.  DIAGNOSIS: A.  BREAST, LEFT; MASTECTOMY: -  INVASIVE MAMMARY CARCINOMA, MULTIFOCAL. - DUCTAL CARCINOMA IN SITU (DCIS). - SEE CANCER SUMMARY BELOW. - TWO BIOPSY SITES WITH ASSOCIATED CLIPS. - UNREMARKABLE SKIN AND NIPPLE.  B.  BREAST, RIGHT; MASTECTOMY: - BENIGN BREAST TISSUE. - UNREMARKABLE SKIN AND NIPPLE. - NEGATIVE FOR ATYPIA AND MALIGNANCY.  C.  SENTINEL LYMPH NODES, LEFT AXILLARY 1 AND 2; EXCISION: - MICROMETASTATIC CARCINOMA INVOLVES ONE OF TWO LYMPH NODES (1/2).  CANCER CASE SUMMARY: INVASIVE CARCINOMA OF THE BREAST Standard(s): AJCC-UICC 8  SPECIMEN Procedure: Mastectomy Specimen Laterality: Left  TUMOR Histologic Type: Mucinous carcinoma Histologic Grade (Not[place] Histologic Score)      Glandular (Acinar)/Tubular Differentiation: 3      Nuclear Pleomorphism: 2      Mitotic  Rate: 1      Overall Grade: 2 Tumor Size: 12 mm Tumor Focality: Multiple foci of invasive carcinoma (3 separate, see comment) Ductal Carcinoma In Situ (DCIS): Present, intermediate grade Tumor Extent: Not applicable Lymphatic and/or Vascular Invasion: Present Treatment Effect in the Breast: No known presurgical therapy  MARGINS Margin Status for Invasive Carcinoma: All margins negative for invasive carcinoma      Distance from closest margin: 10 mm      Specify closest margin: Anterior  Margin Status for DCIS: All margins negative for DCIS      Distance from DCIS to closest margin: 10 mm      Specify closest margin: Anterior  REGIONAL LYMPH NODES Regional Lymph Node Status: Tumor present in regional lymph node(s)      Number of Lymph Nodes with Macrometastases (greater than 2 mm): 0      Number of Lymph Nodes with Micrometastases (greater than 0.2 mm to 2 mm and/or greater than 200 cells): 1      Number of Lymph Nodes with Isolated Tumor Cells (0.2 mm or less OR 200 cells or less): 0      Size of Largest Metastatic Deposit: 0.6 mm     Extranodal Extension: Not identified      Total Number of Lymph Nodes Examined (sentinel and non-sentinel): 2      Number of Sentinel Nodes Examined: 2   CASE SUMMARY: BREAST BIOMARKER TESTS - LEFT AT 2:00, 1 CM FN;  RIBBON-SHAPED CLIP  Estrogen Receptor (ER) Status: POSITIVE          Percentage of cells with nuclear positivity: Greater than 90%          Average intensity of staining: Strong   Progesterone Receptor (PgR) Status: POSITIVE          Percentage of cells with nuclear positivity: Greater than 90%          Average intensity of staining: Strong   HER2 (by immunohistochemistry): NEGATIVE (Score 0)       MEDICAL HISTORY:  Past Medical History:  Diagnosis Date   Acute nontraumatic kidney injury (HCC)    Anemia    Anxiety    Benign cyst of kidney    Breast cancer (HCC) 01/18/2023   left breast   Cervical cancer (HCC)     Chronic hypotension    Chronic kidney disease    Previous now clear   Claustrophobia    Decompression injury of spinal cord    Depression    Family history of adverse reaction to anesthesia     " my mother takes a long time time wake up"   History of dysplastic nevus 01/18/2018   right shoulder, recurrent dysplastic nevus   History of kidney stones    History of  shingles    Migraine    Multiple allergies    Obesity    Osteoarthritis    left hip   Pneumonia    PONV (postoperative nausea and vomiting)    slow to wake up   Sleep apnea    does not wear CPAP   Slow to wake up after anesthesia    Stroke (HCC) 08/11/2020   spinal cord stroke, spinal cord puncture during surgery   Wears glasses     SURGICAL HISTORY: Past Surgical History:  Procedure Laterality Date   ABDOMINAL HYSTERECTOMY     APPENDECTOMY     BACK SURGERY     BILIOPANCREATIC DIVISION W DUODENAL SWITCH N/A    BREAST BIOPSY Left 12/06/2022   u/s bx,1:00 heart clip, path pending   BREAST BIOPSY Left 12/06/2022   [name] bx 2:00 ribbon clip path pending   BREAST BIOPSY Left 12/06/2022   [name] LT BREAST BX W LOC DEV EA ADD LESION IMG BX SPEC US GUIDE 12/06/2022 [facility]   BREAST BIOPSY Left 12/06/2022   [name] LT BREAST BX W LOC DEV 1ST LESION IMG BX SPEC US GUIDE 12/06/2022 [facility]   BREAST RECONSTRUCTION WITH PLACEMENT OF TISSUE EXPANDER AND FLEX HD (ACELLULAR HYDRATED DERMIS) Bilateral 01/18/2023   Procedure: IMMEDIATE LEFT BREAST RECONSTRUCTION WITH PLACEMENT OF TISSUE EXPANDER AND FLEX HD (ACELLULAR HYDRATED DERMIS);  Surgeon: Peggye Form, DO;  Location: [facility];  Service: Plastics;  Laterality: Bilateral;   BREAST RECONSTRUCTION WITH PLACEMENT OF TISSUE EXPANDER AND FLEX HD (ACELLULAR HYDRATED DERMIS) Left 03/26/2023   Procedure: BREAST RECONSTRUCTION WITH PLACEMENT OF TISSUE EXPANDER;  Surgeon: Peggye Form, DO;  Location: [facility];  Service: Plastics;  Laterality: Left;    CHOLECYSTECTOMY     COLONOSCOPY N/A 06/27/2021   Procedure: COLONOSCOPY;  Surgeon: Jaynie Collins, DO;  Location: [facility];  Service: Gastroenterology;  Laterality: N/A;   DIAGNOSTIC LAPAROSCOPY     LOA   ESOPHAGOGASTRODUODENOSCOPY N/A 06/27/2021   Procedure: ESOPHAGOGASTRODUODENOSCOPY (EGD);  Surgeon: Jaynie Collins, DO;  [facility];  Service: Gastroenterology;  Laterality: N/A;   ESOPHAGOGASTRODUODENOSCOPY (EGD) WITH PROPOFOL N/A 07/25/2017   Procedure: ESOPHAGOGASTRODUODENOSCOPY (EGD) WITH PROPOFOL;  Surgeon: Scot Jun, MD;  Location: [facility] ENDOSCOPY;  Service: Endoscopy;  Laterality: N/A;   FLEXIBLE BRONCHOSCOPY     HERNIA REPAIR     KNEE ARTHROSCOPY     LAPAROSCOPIC GASTRIC SLEEVE RESECTION WITH HIATAL HERNIA REPAIR     LUMBAR FUSION  08/29/2022   Revision Lumbar two through five Laminectomy & Fusion with Transforaminal Lumbar interbody fusion   MASTECTOMY W/ SENTINEL NODE BIOPSY Bilateral 01/18/2023   Procedure: MASTECTOMY WITH SENTINEL LYMPH NODE BIOPSY, RNFA to assist;  Surgeon: Leafy Ro, MD;  Location: [facility];  Service: General;  Laterality: Bilateral;   REMOVAL OF TISSUE EXPANDER AND PLACEMENT OF IMPLANT Bilateral 06/06/2023   Procedure: REMOVAL OF TISSUE EXPANDER AND PLACEMENT OF IMPLANT;  Surgeon: Peggye Form, DO;  Location: [facility];  Service: Plastics;  Laterality: Bilateral;   SPINAL CORD STIMULATOR IMPLANT     TONSILLECTOMY     TOTAL HIP ARTHROPLASTY Left 01/26/2016   Procedure: LEFT TOTAL HIP ARTHROPLASTY ANTERIOR APPROACH;  Surgeon: Tarry Kos, MD;  Location: [facility];  Service: Orthopedics;  Laterality: Left;   TRANSFORAMINAL LUMBAR INTERBODY FUSION (TLIF) WITH PEDICLE SCREW FIXATION 3 LEVEL  08/2019   Vira Browns, MD L2-L5    SOCIAL HISTORY: Social History   Socioeconomic History   Marital status:  Divorced    Spouse name: Not on file   Number of children: 1   Years of education: Not on file    Highest education level: Not on file  Occupational History   Not on file  Tobacco Use   Smoking status: Never    Passive exposure: Never   Smokeless tobacco: Never  Vaping Use   Vaping status: Never Used  Substance and Sexual Activity   Alcohol use: Not Currently    Comment: occasional wine   Drug use: No   Sexual activity: Not Currently    Birth control/protection: Surgical    Comment: Hyst  Other Topics Concern   Not on file  Social History Narrative   Right handed    Uses cane to walk    Son has Medical and legal POA.   Social Determinants of Health   Financial Resource Strain: Medium Risk (12/11/2022)   Overall Financial Resource Strain (CARDIA)    Difficulty of Paying Living Expenses: Somewhat hard  Food Insecurity: No Food Insecurity (01/18/2023)   Hunger Vital Sign    Worried About Running Out of Food in the Last Year: Never true    Ran Out of Food in the Last Year: Never true  Recent Concern: Food Insecurity - Food Insecurity Present (12/08/2022)   Hunger Vital Sign    Worried About Running Out of Food in the Last Year: Often true    Ran Out of Food in the Last Year: Often true  Transportation Needs: No Transportation Needs (01/18/2023)   PRAPARE - Administrator, Civil Service (Medical): No    Lack of Transportation (Non-Medical): No  Physical Activity: Inactive (12/11/2022)   Exercise Vital Sign    Days of Exercise per Week: 0 days    Minutes of Exercise per Session: 0 min  Stress: Stress Concern Present (12/11/2022)   [employer] of Occupational Health - Occupational Stress Questionnaire    Feeling of Stress : To some extent  Social Connections: Moderately Isolated (12/11/2022)   Social Connection and Isolation Panel [NHANES]    Frequency of Communication with Friends and Family: Three times a week    Frequency of Social Gatherings with Friends and Family: Twice a week    Attends Religious Services: 1 to 4 times per year    Active Member of [employer]  or Organizations: No    Attends Banker Meetings: Never    Marital Status: Divorced  Catering manager Violence: Not At Risk (01/18/2023)   Humiliation, Afraid, Rape, and Kick questionnaire    Fear of Current or Ex-Partner: No    Emotionally Abused: No    Physically Abused: No    Sexually Abused: No    FAMILY HISTORY: Family History  Problem Relation Age of Onset   Renal Disease Mother    Hypertension Mother    Sudden Cardiac Death Mother    Heart failure Mother    Valvular heart disease Mother    Heart disease Mother    Breast cancer Maternal Grandmother    Stroke Brother    Heart attack Brother 41   Diabetes Other     ALLERGIES:  is allergic to bee venom, iodinated contrast media, latex, morphine, shellfish allergy, codeine, meperidine hcl, bee pollen, oxycodone, tape, pentazocine lactate, and propoxyphene.  MEDICATIONS:  Current Outpatient Medications  Medication Sig Dispense Refill   azelastine (ASTELIN) 0.1 % nasal spray Place 1 spray into both nostrils 2 (two) times daily. Use in each nostril as directed  baclofen (LIORESAL) 10 MG tablet Take 1 tablet (10 mg total) by mouth 3 (three) times daily. - for spasms 270 tablet 3   diazepam (VALIUM) 2 MG tablet Take 1 tablet (2 mg total) by mouth every 12 (twelve) hours as needed for up to 5 doses for muscle spasms. 5 tablet 0   diclofenac Sodium (VOLTAREN) 1 % GEL Apply 2 g topically 4 (four) times daily. 50 g 5   Dulaglutide 1.5 MG/0.5ML SOAJ Inject 1.5 mg into the skin once a week.     EPINEPHrine 0.3 mg/0.3 mL IJ SOAJ injection      fluticasone (FLONASE) 50 MCG/ACT nasal spray Place 2 sprays into both nostrils daily.     gabapentin (NEURONTIN) 300 MG capsule Take 1 capsule (300 mg total) by mouth at bedtime. For nerve pain 90 capsule 3   methocarbamol (ROBAXIN) 500 MG tablet Take 500 mg by mouth 4 (four) times daily.     montelukast (SINGULAIR) 10 MG tablet TAKE 1 TABLET BY MOUTH AT  BEDTIME FOR ALLERGIES 30  tablet 1   Multiple Vitamin (MULTIVITAMIN WITH MINERALS) TABS tablet Take 1 tablet by mouth 2 (two) times daily.     pramipexole (MIRAPEX) 0.125 MG tablet Take by mouth.     pyridOXINE (VITAMIN B-6) 100 MG tablet Take 1 tablet (100 mg total) by mouth daily. 90 tablet 1   tamoxifen (NOLVADEX) 20 MG tablet Take 1 tablet (20 mg total) by mouth daily. 90 tablet 0   thiamine 250 MG tablet Take 250 mg by mouth daily.     topiramate (TOPAMAX) 100 MG tablet TAKE 1 TABLET BY MOUTH AT  BEDTIME 90 tablet 3   vitamin B-12 (CYANOCOBALAMIN) 1000 MCG tablet Take 1 tablet (1,000 mcg total) by mouth daily. 90 tablet 1   No current facility-administered medications for this visit.    REVIEW OF SYSTEMS:   Pertinent information mentioned in HPI All other systems were reviewed with the patient and are negative.  PHYSICAL EXAMINATION: ECOG PERFORMANCE STATUS: 2 - Symptomatic, <50% confined to bed  Vitals:   08/06/23 1259  BP: 135/80  Pulse: 70  Temp: 97.7 F (36.5 C)  SpO2: 100%   Filed Weights   08/06/23 1259  Weight: 261 lb (118.4 kg)    GENERAL:alert, no distress and comfortable SKIN: skin color, texture, turgor are normal, no rashes or significant lesions EYES: normal, conjunctiva are pink and non-injected, sclera clear OROPHARYNX:no exudate, no erythema and lips, buccal mucosa, and tongue normal  NECK: supple, thyroid normal size, non-tender, without nodularity LYMPH:  no palpable lymphadenopathy in the cervical, axillary or inguinal LUNGS: clear to auscultation and percussion with normal breathing effort HEART: regular rate & rhythm and no murmurs and no lower extremity edema ABDOMEN:abdomen soft, non-tender and normal bowel sounds Musculoskeletal:no cyanosis of digits and no clubbing  PSYCH: alert & oriented x 3 with fluent speech NEURO: no focal motor/sensory deficits  LABORATORY DATA:  I have reviewed the data as listed Lab Results  Component Value Date   WBC 3.6 (L) 08/06/2023    HGB 11.7 (L) 08/06/2023   HCT 36.2 08/06/2023   MCV 99.5 08/06/2023   PLT 279 08/06/2023   Recent Labs    12/08/22 1237 01/18/23 1858 01/19/23 0533 06/26/23 1237 08/06/23 1353  NA 137  --  142 140 142  K 3.8  --  3.8 3.8 3.6  CL 110  --  114* 113* 111  CO2 23  --  22 22 22   GLUCOSE 86  --  97 101* 94  BUN 26*  --  18 11 11   CREATININE 1.09*   < > 1.01* 1.14* 0.90  CALCIUM 8.7*  --  8.6* 8.7* 8.6*  GFRNONAA >60   < > >60 58* >60  PROT 7.8  --   --  7.1 7.4  ALBUMIN 4.1  --   --  3.8 3.9  AST 19  --   --  21 22  ALT 15  --   --  18 21  ALKPHOS 123  --   --  87 78  BILITOT 0.4  --   --  0.5 0.6   < > = values in this interval not displayed.    RADIOGRAPHIC STUDIES: I have personally reviewed the radiological images as listed and agreed with the findings in the report. No results found.

## 2023-08-06 NOTE — Telephone Encounter (Signed)
Patient called reporting that for the past week she has had swelling bil lower extremities from knees to toes to point that it is painful to walk, she cannot wear regular shoes . She has called her PCP who cannot see her for a while so she is calling Dr A thinking that it is from the tamoxifen she has been on for past 5 months She does endorse right calf pain greater than left. She states that she has swelling up to her hip on the left. She is doing Physical Therapy for past month. Denies eating anything with a lot of salt in it. Has not changed her diet. She states that she sent a picture in a MyChart message this morning.Please advise

## 2023-08-06 NOTE — Progress Notes (Signed)
Since patient was told to stop taking the Cymbalta dizziness, being light headed a lot. Patient is more concerned about the tamoxifen, she has been reading about that concerns her. For about a week now her legs and ankles have been swelling up bad and causing her some pain rates at about a 10.

## 2023-08-07 ENCOUNTER — Telehealth: Payer: Self-pay | Admitting: Internal Medicine

## 2023-08-07 DIAGNOSIS — Z79811 Long term (current) use of aromatase inhibitors: Secondary | ICD-10-CM

## 2023-08-07 MED ORDER — LETROZOLE 2.5 MG PO TABS: 2.5000 mg | ORAL_TABLET | Freq: Every day | ORAL | 0 refills | Status: DC

## 2023-08-07 NOTE — Telephone Encounter (Signed)
Spoke with the patient about the results.  Venous Dopplers did not show any DVT. CBC and CMP are unremarkable. Echo from January 2023 was normal.  Tamoxifen or venous insufficiency may be contributing to bilateral lower extremity swelling.  Discussed about switching to aromatase inhibitor and see if the swelling improves.  She is agreeable.  I will send a prescription for letrozole 2.5 mg once daily.  I will follow-up with her in 4 weeks to assess for tolerability.  If doing well, will also go ahead and schedule for DEXA scan.  Continue with leg elevation.  Consider OTC compression stocking 15 to 20 mmHg.  If persistent concern, can consider low-dose Lasix.  Would hold off right now.  Trial of conservative measures first.  She is scheduled to see Dr. Burnett Sheng today.  I will alert my staff to follow-up visit in 4 weeks.

## 2023-08-08 ENCOUNTER — Ambulatory Visit: Payer: Medicaid Other

## 2023-08-10 ENCOUNTER — Ambulatory Visit (INDEPENDENT_AMBULATORY_CARE_PROVIDER_SITE_OTHER): Payer: Medicaid Other | Admitting: Orthopaedic Surgery

## 2023-08-10 DIAGNOSIS — M170 Bilateral primary osteoarthritis of knee: Secondary | ICD-10-CM

## 2023-08-10 DIAGNOSIS — Z6841 Body Mass Index (BMI) 40.0 and over, adult: Secondary | ICD-10-CM

## 2023-08-10 MED ORDER — TRAMADOL HCL 50 MG PO TABS: 50.0000 mg | ORAL_TABLET | Freq: Two times a day (BID) | ORAL | 1 refills | Status: DC | PRN

## 2023-08-10 NOTE — Progress Notes (Signed)
Office Visit Note   Patient: Vanessa Medina           Date of Birth: April 10, 1971           MRN: [id number] Visit Date: 08/10/2023              Requested by: [name], MD [address] [place],  [place] [address] PCP: [name], MD   Assessment & Plan: Visit Diagnoses:  1. Bilateral primary osteoarthritis of knee   2. Body mass index 40.0-44.9, adult (HCC)     Plan: Impression is bilateral knee osteoarthritis right greater than left.  Today, we discussed various treatment options to include repeat cortisone injection versus viscosupplementation injection versus total knee arthroplasty.  Due to everything she has gone through recently with breast cancer, her entire body is relatively deconditioned which has not helped with pain.  She is currently not in a place to proceed with surgery at this time.  She has elected to proceed with bilateral knee cortisone injections today.  She will follow-up with [name] as needed.  Follow-Up Instructions: Return if symptoms worsen or fail to improve.   Orders:  No orders of the defined types were placed in this encounter.  Meds ordered this encounter  Medications   traMADol (ULTRAM) 50 MG tablet    Sig: Take 1 tablet (50 mg total) by mouth every 12 (twelve) hours as needed.    Dispense:  30 tablet    Refill:  1      Procedures: No procedures performed   Clinical Data: No additional findings.   Subjective: Chief Complaint  Patient presents with   Right Knee - Pain   Left Knee - Pain    HPI patient is a pleasant 52 year old female who comes in today with recurrent bilateral knee pain right greater than left.  History of osteoarthritis to both knees right greater than left.  She is complaining of a constant toothache type pain worse with any activity as well as when she is sleeping.  She has been taking Tylenol without relief.  She is currently being treated for breast cancer.  Previous cortisone injection to  the right knee back in August of this year provided minimal relief.  She thinks she may have had gel injections in the past but is not sure how much these helped.  Review of Systems as detailed in HPI.  All others reviewed and are negative.   Objective: Vital Signs: There were no vitals taken for this visit.  Physical Exam well-developed and well-nourished female in no acute distress.  Alert and oriented x 3.  Ortho Exam bilateral knee exam: Range of motion 5 to 90 degrees.  Medial and lateral joint line tenderness.  She is neurovascularly intact distally.  Specialty Comments:  No specialty comments available.  Imaging: No new imaging   PMFS History: Patient Active Problem List   Diagnosis Date Noted   Bilateral lower extremity edema 08/06/2023   Back pain 08/06/2023   Long-term current use of tamoxifen 06/26/2023   Claustrophobia    Pernicious anemia 03/14/2023   Ruptured left breast implant 02/13/2023   S/P mastectomy, bilateral 01/18/2023   Genetic testing 12/18/2022   Breast cancer (HCC) 12/08/2022   Family history of breast cancer 12/08/2022   Profound fatigue 12/08/2022   Lumbar post-laminectomy syndrome 06/02/2022   Pseudarthrosis after fusion or arthrodesis 05/19/2022   Primary osteoarthritis of right knee 03/28/2022   Subjective tinnitus of both ears 12/21/2021  Abnormality of gait 04/29/2021   Abnormal sensation in both ears 04/06/2021   Other adverse food reactions, not elsewhere classified, subsequent encounter 04/06/2021   Multiple drug allergies 04/06/2021   Insomnia due to medical condition 02/25/2021   Myofascial muscle pain 02/25/2021   Nerve pain 10/18/2020   Incomplete paraplegia (HCC) 10/18/2020   Orthostatic hypotension 10/18/2020   Renal cyst 09/23/2020   Chronic migraine without aura without status migrainosus, not intractable    Hypokalemia    Adjustment reaction with anxiety and depression    Spinal cord injury, lumbar, without spinal bone  injury, sequela (HCC) 08/18/2020   Paraparesis (HCC)    Post-operative pain    Hypotension    Slow transit constipation    AKI (acute kidney injury) (HCC)    Right leg weakness 08/11/2020   DDD (degenerative disc disease), lumbar 09/02/2019    Class: Chronic   Degenerative disc disease, lumbar 09/02/2019   Chest tightness 09/23/2018   Bradycardia 09/23/2018   BMI 40.0-44.9, adult (HCC) 04/17/2018   Status post bariatric surgery 04/17/2018   Labile blood pressure 03/08/2018   Chronic low back pain 09/03/2017   History of total hip replacement, left 01/26/2016   Osteoarthritis of spine with radiculopathy, lumbar region 10/16/2014   LEG PAIN, BILATERAL 12/16/2007   Malignant neoplasm of cervix uteri (HCC) 07/02/2007   MORBID OBESITY 07/02/2007   DEPRESSION 07/02/2007   Migraine without aura 07/02/2007   Other allergic rhinitis 07/02/2007   Asthma 07/02/2007   GERD 07/02/2007   ELEVATED BLOOD PRESSURE WITHOUT DIAGNOSIS OF HYPERTENSION 07/02/2007   Asthma 07/02/2007   Past Medical History:  Diagnosis Date   Acute nontraumatic kidney injury (HCC)    Anemia    Anxiety    Benign cyst of kidney    Breast cancer (HCC) 01/18/2023   left breast   Cervical cancer (HCC)    Chronic hypotension    Chronic kidney disease    Previous now clear   Claustrophobia    Decompression injury of spinal cord    Depression    Family history of adverse reaction to anesthesia     " my mother takes a long time time wake up"   History of dysplastic nevus 01/18/2018   right shoulder, recurrent dysplastic nevus   History of kidney stones    History of shingles    Migraine    Multiple allergies    Obesity    Osteoarthritis    left hip   Pneumonia    PONV (postoperative nausea and vomiting)    slow to wake up   Sleep apnea    does not wear CPAP   Slow to wake up after anesthesia    Stroke (HCC) 08/11/2020   spinal cord stroke, spinal cord puncture during surgery   Wears glasses     Family  History  Problem Relation Age of Onset   Renal Disease Mother    Hypertension Mother    Sudden Cardiac Death Mother    Heart failure Mother    Valvular heart disease Mother    Heart disease Mother    Breast cancer Maternal Grandmother    Stroke Brother    Heart attack Brother 61   Diabetes Other     Past Surgical History:  Procedure Laterality Date   ABDOMINAL HYSTERECTOMY     APPENDECTOMY     BACK SURGERY     BILIOPANCREATIC DIVISION W DUODENAL SWITCH N/A    BREAST BIOPSY Left 12/06/2022   u/s bx,1:00 heart clip, path  pending   BREAST BIOPSY Left 12/06/2022   [name] bx 2:00 ribbon clip path pending   BREAST BIOPSY Left 12/06/2022   [name] LT BREAST BX W LOC DEV EA ADD LESION IMG BX SPEC US GUIDE 12/06/2022 [facility]   BREAST BIOPSY Left 12/06/2022   [name] LT BREAST BX W LOC DEV 1ST LESION IMG BX SPEC US GUIDE 12/06/2022 [facility]   BREAST RECONSTRUCTION WITH PLACEMENT OF TISSUE EXPANDER AND FLEX HD (ACELLULAR HYDRATED DERMIS) Bilateral 01/18/2023   Procedure: IMMEDIATE LEFT BREAST RECONSTRUCTION WITH PLACEMENT OF TISSUE EXPANDER AND FLEX HD (ACELLULAR HYDRATED DERMIS);  Surgeon: Peggye Form, DO;  Location: [facility];  Service: Plastics;  Laterality: Bilateral;   BREAST RECONSTRUCTION WITH PLACEMENT OF TISSUE EXPANDER AND FLEX HD (ACELLULAR HYDRATED DERMIS) Left 03/26/2023   Procedure: BREAST RECONSTRUCTION WITH PLACEMENT OF TISSUE EXPANDER;  Surgeon: Peggye Form, DO;  Location: [facility];  Service: Plastics;  Laterality: Left;   CHOLECYSTECTOMY     COLONOSCOPY N/A 06/27/2021   Procedure: COLONOSCOPY;  Surgeon: Jaynie Collins, DO;  Location: [facility];  Service: Gastroenterology;  Laterality: N/A;   DIAGNOSTIC LAPAROSCOPY     LOA   ESOPHAGOGASTRODUODENOSCOPY N/A 06/27/2021   Procedure: ESOPHAGOGASTRODUODENOSCOPY (EGD);  Surgeon: Jaynie Collins, DO;  Location: [facility];  Service: Gastroenterology;  Laterality: N/A;    ESOPHAGOGASTRODUODENOSCOPY (EGD) WITH PROPOFOL N/A 07/25/2017   Procedure: ESOPHAGOGASTRODUODENOSCOPY (EGD) WITH PROPOFOL;  Surgeon: Scot Jun, MD;  Location: [facility];  Service: Endoscopy;  Laterality: N/A;   FLEXIBLE BRONCHOSCOPY     HERNIA REPAIR     KNEE ARTHROSCOPY     LAPAROSCOPIC GASTRIC SLEEVE RESECTION WITH HIATAL HERNIA REPAIR     LUMBAR FUSION  08/29/2022   Revision Lumbar two through five Laminectomy & Fusion with Transforaminal Lumbar interbody fusion   MASTECTOMY W/ SENTINEL NODE BIOPSY Bilateral 01/18/2023   Procedure: MASTECTOMY WITH SENTINEL LYMPH NODE BIOPSY, RNFA to assist;  Surgeon: Leafy Ro, MD;  Location: [facility];  Service: General;  Laterality: Bilateral;   REMOVAL OF TISSUE EXPANDER AND PLACEMENT OF IMPLANT Bilateral 06/06/2023   Procedure: REMOVAL OF TISSUE EXPANDER AND PLACEMENT OF IMPLANT;  Surgeon: Peggye Form, DO;  Location: [facility];  Service: Plastics;  Laterality: Bilateral;   SPINAL CORD STIMULATOR IMPLANT     TONSILLECTOMY     TOTAL HIP ARTHROPLASTY Left 01/26/2016   Procedure: LEFT TOTAL HIP ARTHROPLASTY ANTERIOR APPROACH;  Surgeon: Tarry Kos, MD;  Location: [facility];  Service: Orthopedics;  Laterality: Left;   TRANSFORAMINAL LUMBAR INTERBODY FUSION (TLIF) WITH PEDICLE SCREW FIXATION 3 LEVEL  08/2019   Vira Browns, MD L2-L5   Social History   Occupational History   Not on file  Tobacco Use   Smoking status: Never    Passive exposure: Never   Smokeless tobacco: Never  Vaping Use   Vaping status: Never Used  Substance and Sexual Activity   Alcohol use: Not Currently    Comment: occasional wine   Drug use: No   Sexual activity: Not Currently    Birth control/protection: Surgical    Comment: Hyst

## 2023-08-13 ENCOUNTER — Ambulatory Visit: Payer: Medicaid Other

## 2023-08-15 ENCOUNTER — Ambulatory Visit: Payer: Medicaid Other | Admitting: Surgical

## 2023-08-15 ENCOUNTER — Inpatient Hospital Stay: Payer: Medicaid Other | Admitting: Occupational Therapy

## 2023-08-15 DIAGNOSIS — Z9889 Other specified postprocedural states: Secondary | ICD-10-CM

## 2023-08-15 DIAGNOSIS — I890 Lymphedema, not elsewhere classified: Secondary | ICD-10-CM

## 2023-08-15 DIAGNOSIS — Z9013 Acquired absence of bilateral breasts and nipples: Secondary | ICD-10-CM

## 2023-08-15 NOTE — Progress Notes (Signed)
Patient is a 52 year old female here via telephone to discuss her bilateral breast reconstruction.  She was last seen here in the office on 07/25/2023, 3 weeks ago.  She reports that she has overall been doing well, she reports she is currently on a trip with her friend to [facility].  She reports that she has been feeling much better after taking some time for herself to relax.  She reports her pain has improved.  She does not have any specific concerns today.  She reports that she has not received the Jovi pack just yet, but believes she is going to receive it tomorrow.  She does report she has some questions about next steps.  We discussed possibility for fat grafting to improve shape and symmetry.  We discussed that this is something that can be done at any point, but because of her pain and other things she has going on at this time, it would be best to give her some time to recover prior to proceeding with any further procedures.  She was in agreement with this.  The patient gave consent to have this visit done by telemedicine / virtual visit, two identifiers were used to identify patient. This is also consent for access the chart and treat the patient via this visit. The patient is located in [place].  I, the provider, am at the office.  We spent 5 minutes together for the visit.  Joined by phone.  She is scheduled to see Dr. Ulice Bold in approximately 2 to 3 weeks for excision of a left breast skin lesion.  She reports she does not have any other specific questions

## 2023-08-16 ENCOUNTER — Ambulatory Visit: Payer: Medicaid Other

## 2023-08-16 DIAGNOSIS — M25612 Stiffness of left shoulder, not elsewhere classified: Secondary | ICD-10-CM

## 2023-08-16 DIAGNOSIS — M6281 Muscle weakness (generalized): Secondary | ICD-10-CM

## 2023-08-16 DIAGNOSIS — G8929 Other chronic pain: Secondary | ICD-10-CM

## 2023-08-16 NOTE — Therapy (Signed)
OUTPATIENT PHYSICAL THERAPY SHOULDER and ORTHO TREATMENT NOTE   Patient Name: SURAIYA BARIS MRN: [id number] DOB:08-10-1971, 52 y.o., female Today's Date: 08/16/2023  END OF SESSION:  PT End of Session - 08/16/23 1131     Visit Number 9    Number of Visits 24    Date for PT Re-Evaluation 09/19/23    Progress Note Due on Visit 10    PT Start Time 1103    PT Stop Time 1145    PT Time Calculation (min) 42 min    Activity Tolerance Patient tolerated treatment well    Behavior During Therapy WFL for tasks assessed/performed                    Past Medical History:  Diagnosis Date   Acute nontraumatic kidney injury (HCC)    Anemia    Anxiety    Benign cyst of kidney    Breast cancer (HCC) 01/18/2023   left breast   Cervical cancer (HCC)    Chronic hypotension    Chronic kidney disease    Previous now clear   Claustrophobia    Decompression injury of spinal cord    Depression    Family history of adverse reaction to anesthesia     " my mother takes a long time time wake up"   History of dysplastic nevus 01/18/2018   right shoulder, recurrent dysplastic nevus   History of kidney stones    History of shingles    Migraine    Multiple allergies    Obesity    Osteoarthritis    left hip   Pneumonia    PONV (postoperative nausea and vomiting)    slow to wake up   Sleep apnea    does not wear CPAP   Slow to wake up after anesthesia    Stroke (HCC) 08/11/2020   spinal cord stroke, spinal cord puncture during surgery   Wears glasses    Past Surgical History:  Procedure Laterality Date   ABDOMINAL HYSTERECTOMY     APPENDECTOMY     BACK SURGERY     BILIOPANCREATIC DIVISION W DUODENAL SWITCH N/A    BREAST BIOPSY Left 12/06/2022   u/s bx,1:00 heart clip, path pending   BREAST BIOPSY Left 12/06/2022   [name] bx 2:00 ribbon clip path pending   BREAST BIOPSY Left 12/06/2022   [name] LT BREAST BX W LOC DEV EA ADD LESION IMG BX SPEC US GUIDE 12/06/2022 [facility]    BREAST BIOPSY Left 12/06/2022   [name] LT BREAST BX W LOC DEV 1ST LESION IMG BX SPEC US GUIDE 12/06/2022 [facility]   BREAST RECONSTRUCTION WITH PLACEMENT OF TISSUE EXPANDER AND FLEX HD (ACELLULAR HYDRATED DERMIS) Bilateral 01/18/2023   Procedure: IMMEDIATE LEFT BREAST RECONSTRUCTION WITH PLACEMENT OF TISSUE EXPANDER AND FLEX HD (ACELLULAR HYDRATED DERMIS);  Surgeon: Peggye Form, DO;  Location: [facility];  Service: Plastics;  Laterality: Bilateral;   BREAST RECONSTRUCTION WITH PLACEMENT OF TISSUE EXPANDER AND FLEX HD (ACELLULAR HYDRATED DERMIS) Left 03/26/2023   Procedure: BREAST RECONSTRUCTION WITH PLACEMENT OF TISSUE EXPANDER;  Surgeon: Peggye Form, DO;  Location: [facility];  Service: Plastics;  Laterality: Left;   CHOLECYSTECTOMY     COLONOSCOPY N/A 06/27/2021   Procedure: COLONOSCOPY;  Surgeon: Jaynie Collins, DO;  Location: [facility];  Service: Gastroenterology;  Laterality: N/A;   DIAGNOSTIC LAPAROSCOPY     LOA   ESOPHAGOGASTRODUODENOSCOPY N/A 06/27/2021   Procedure: ESOPHAGOGASTRODUODENOSCOPY (EGD);  Surgeon: Jaynie Collins, DO;  Location: [facility];  Service: Gastroenterology;  Laterality: N/A;   ESOPHAGOGASTRODUODENOSCOPY (EGD) WITH PROPOFOL N/A 07/25/2017   Procedure: ESOPHAGOGASTRODUODENOSCOPY (EGD) WITH PROPOFOL;  Surgeon: Scot Jun, MD;  Location: [facility]OSCOPY;  Service: Endoscopy;  Laterality: N/A;   FLEXIBLE BRONCHOSCOPY     HERNIA REPAIR     KNEE ARTHROSCOPY     LAPAROSCOPIC GASTRIC SLEEVE RESECTION WITH HIATAL HERNIA REPAIR     LUMBAR FUSION  08/29/2022   Revision Lumbar two through five Laminectomy & Fusion with Transforaminal Lumbar interbody fusion   MASTECTOMY W/ SENTINEL NODE BIOPSY Bilateral 01/18/2023   Procedure: MASTECTOMY WITH SENTINEL LYMPH NODE BIOPSY, RNFA to assist;  Surgeon: Leafy Ro, MD;  Location: [facility];  Service: General;  Laterality: Bilateral;   REMOVAL OF TISSUE EXPANDER AND  PLACEMENT OF IMPLANT Bilateral 06/06/2023   Procedure: REMOVAL OF TISSUE EXPANDER AND PLACEMENT OF IMPLANT;  Surgeon: Peggye Form, DO;  Location: [facility];  Service: Plastics;  Laterality: Bilateral;   SPINAL CORD STIMULATOR IMPLANT     TONSILLECTOMY     TOTAL HIP ARTHROPLASTY Left 01/26/2016   Procedure: LEFT TOTAL HIP ARTHROPLASTY ANTERIOR APPROACH;  Surgeon: Tarry Kos, MD;  Location: [facility];  Service: Orthopedics;  Laterality: Left;   TRANSFORAMINAL LUMBAR INTERBODY FUSION (TLIF) WITH PEDICLE SCREW FIXATION 3 LEVEL  08/2019   Vira Browns, MD L2-L5   Patient Active Problem List   Diagnosis Date Noted   Bilateral lower extremity edema 08/06/2023   Back pain 08/06/2023   Long-term current use of tamoxifen 06/26/2023   Claustrophobia    Pernicious anemia 03/14/2023   Ruptured left breast implant 02/13/2023   S/P mastectomy, bilateral 01/18/2023   Genetic testing 12/18/2022   Breast cancer (HCC) 12/08/2022   Family history of breast cancer 12/08/2022   Profound fatigue 12/08/2022   Lumbar post-laminectomy syndrome 06/02/2022   Pseudarthrosis after fusion or arthrodesis 05/19/2022   Primary osteoarthritis of right knee 03/28/2022   Subjective tinnitus of both ears 12/21/2021   Abnormality of gait 04/29/2021   Abnormal sensation in both ears 04/06/2021   Other adverse food reactions, not elsewhere classified, subsequent encounter 04/06/2021   Multiple drug allergies 04/06/2021   Insomnia due to medical condition 02/25/2021   Myofascial muscle pain 02/25/2021   Nerve pain 10/18/2020   Incomplete paraplegia (HCC) 10/18/2020   Orthostatic hypotension 10/18/2020   Renal cyst 09/23/2020   Chronic migraine without aura without status migrainosus, not intractable    Hypokalemia    Adjustment reaction with anxiety and depression    Spinal cord injury, lumbar, without spinal bone injury, sequela (HCC) 08/18/2020   Paraparesis (HCC)    Post-operative pain     Hypotension    Slow transit constipation    AKI (acute kidney injury) (HCC)    Right leg weakness 08/11/2020   DDD (degenerative disc disease), lumbar 09/02/2019    Class: Chronic   Degenerative disc disease, lumbar 09/02/2019   Chest tightness 09/23/2018   Bradycardia 09/23/2018   BMI 40.0-44.9, adult (HCC) 04/17/2018   Status post bariatric surgery 04/17/2018   Labile blood pressure 03/08/2018   Chronic low back pain 09/03/2017   History of total hip replacement, left 01/26/2016   Osteoarthritis of spine with radiculopathy, lumbar region 10/16/2014   LEG PAIN, BILATERAL 12/16/2007   Malignant neoplasm of cervix uteri (HCC) 07/02/2007   MORBID OBESITY 07/02/2007   DEPRESSION 07/02/2007   Migraine without aura 07/02/2007   Other allergic rhinitis 07/02/2007   Asthma 07/02/2007  GERD 07/02/2007   ELEVATED BLOOD PRESSURE WITHOUT DIAGNOSIS OF HYPERTENSION 07/02/2007   Asthma 07/02/2007    PCP: [name] MD  REFERRING PROVIDER: Caroline More  REFERRING DIAG: s/p breast reconstruction  THERAPY DIAG:  Stiffness of left shoulder, not elsewhere classified  Chronic left shoulder pain  Muscle weakness (generalized)  Rationale for Evaluation and Treatment: Rehabilitation  ONSET DATE: December 07 2022  SUBJECTIVE:                                                                                                                                                                                      SUBJECTIVE STATEMENT: Pt reports swelling is better, pt has been put on Lasix. She returns from the beach, had a good time. She reports LUE is a little stiff/sore but not what it has been. She reports getting to relax at the beach had a big impact.  Hand dominance: Right  PERTINENT HISTORY: Patient is having LUE ROM issues s/p breast cancer implant reconstruction. Patient underwent bilateral exchange of tissue expanders for implants, bilateral capsulotomies for implant repositioning  and right breast capsulectomy with major repositioning of 3 x 8 cm of capsule on 06/06/2023. Patient had back surgery December 26th 2023 and then was diagnosed with breast cancer December 07, 2022. Had surgery Jan 18, 2023 for her breast cancer, had double mastectomy. Patient had two sets of expanders, first one burst, the second one kept flipping and moving. Had to have more work on L side. PMH includes: anemia, anxiety, breast cancer, CKD, claustrophobia, depression, migraines, OA, PONV, sleep apnea, stroke, lumbar fusion, mastectomy.   PAIN:  Are you having pain? Yes: NPRS scale: 4/10 Pain location: L shoulder and breast Pain description: post surgical Aggravating factors: lift something  Relieving factors: none Worst: 7/10 Least: 2/10    PRECAUTIONS: Other: no lifting greater than 1/2 gallon  , pt reports LATEX ALLERGY   RED FLAGS: None   WEIGHT BEARING RESTRICTIONS:  no more than 1/2 gallon  FALLS:  Has patient fallen in last 6 months? Yes. Number of falls 1  LIVING ENVIRONMENT: Lives with: lives with their family Lives in: House/apartment Stairs: Yes: External: 2 steps; on right going up and on left going up Has following equipment at home: Single point cane, Walker - 2 wheeled, shower chair, and Grab bars  OCCUPATION: Disability   PLOF: Independent with basic ADLs  PATIENT GOALS:to decrease pain in L shoulder   NEXT MD VISIT:   OBJECTIVE:  Note: Objective measures were completed at Evaluation unless otherwise noted.  DIAGNOSTIC FINDINGS:  N/a   PATIENT SURVEYS:  FOTO 32  COGNITION: Overall cognitive status:  some short term  memory      SENSATION: WFL  POSTURE: Rounding shoulders,   UPPER EXTREMITY ROM:   Active ROM Right eval Left eval  Shoulder flexion 115* 90  Shoulder extension 48 41  Shoulder abduction 81* 89  Shoulder adduction    Shoulder internal rotation    Shoulder external rotation Unable to test in testing position Unable to test in  testing position  (Blank rows = not tested) *  UPPER EXTREMITY MMT: Deferred at this time due to limited ROM and pain   MMT Right eval Left eval  Shoulder flexion    Shoulder extension    Shoulder abduction    Shoulder adduction    Shoulder internal rotation    Shoulder external rotation    Middle trapezius    Lower trapezius    Elbow flexion    Elbow extension    Wrist flexion    Wrist extension    Wrist ulnar deviation    Wrist radial deviation    Wrist pronation    Wrist supination    Grip strength (lbs)    (Blank rows = not tested)  SHOULDER SPECIAL TESTS: Deferred due to limited ROM   JOINT MOBILITY TESTING:  GH: hypomobile AP, PA, inferior glide bilateral   PALPATION:  Muscle guarding subscap, pectoral, glenohumeral region  Significant fluid on L flank    TODAY'S TREATMENT:                                                                                                                                         DATE: 08/16/23   Manual:  On plinth PROM- L shoulder flexion and scaption and abduction x multiple reps with 15-20 sec holds for a few reps to promote tissue extensibility  TE: PVC overhead raise/flexion AAROM with 5 sec holds 10x Chest press PVC 15x Supine RTB ER 3x10 bilat UE  Supine YTB L shoulder ER 10x Supine shoulder LUE flexion 2x10 Seated shoulder rows YTB 10x Seated shoulder rows RTB 10x Seated bilat shoulder flexion 10x  Seated bilat shoulder abduction 10x - fatiguing   HEP updated and reviewed in session: Access Code: [email or web address] URL: [email or web address] Date: 08/16/2023 Prepared by: Temple Pacini  Exercises - Standing Shoulder External Rotation with Resistance  - 1 x daily - 5-6 x weekly - 3 sets - 10 reps - Supine Alternating Shoulder Flexion  - 1 x daily - 5-6 x weekly - 3 sets - 10 reps - Seated Shoulder Row with Anchored Resistance  - 1 x daily - 5-6 x weekly - 3 sets - 10 reps   Latex free therabands used in  session  PATIENT EDUCATION: Education details: exercise technique, update to HEP, Person educated: Patient Education method: Explanation, Demonstration, Tactile cues, Verbal cues, and Handouts Education comprehension: verbalized understanding, returned demonstration, verbal cues required, and tactile cues required  HOME EXERCISE PROGRAM:  12/2: Access Code: [email or web address] URL: [email or web address] Date:  08/06/2023 Prepared by: Temple Pacini  Exercises - Seated Shoulder Flexion Towel Slide at Table Top  - 1 x daily - 5-7 x weekly - 2 sets - 10 reps - 15-30 sec hold - Seated Shoulder Abduction Towel Slide at Table Top  - 1 x daily - 5-7 x weekly - 2 sets - 10 reps - 15-30 sec hold  11/26: updated Access Code: [email or web address] URL: [email or web address] Date: 07/31/2023 Prepared by: Temple Pacini  Exercises - Isometric Shoulder Flexion at Wall  - 1 x daily - 7 x weekly - 2 sets - 10 reps - Isometric Shoulder Extension at Wall  - 1 x daily - 7 x weekly - 2 sets - 10 reps - Standing Isometric Shoulder Internal Rotation at Doorway  - 1 x daily - 7 x weekly - 2 sets - 10 reps - Standing Isometric Shoulder External Rotation with Doorway  - 1 x daily - 7 x weekly - 2 sets - 10 reps  Access Code: [email or web address] URL: [email or web address] Date: 07/02/2023 Prepared by: Temple Pacini  Exercises - Seated Shoulder Shrugs  - 2 x daily - 6-7 x weekly - 3 sets - 10 reps - Seated Shoulder Shrug Circles AROM Backward  - 2 x daily - 6-7 x weekly - 3 sets - 10 reps - Seated Scapular Retraction  - 1 x daily - 6-7 x weekly - 3 sets - 10 reps  ASSESSMENT:  CLINICAL IMPRESSION: Pt returns to PT exhibiting improved pain-free ROM of L shoulder. HEP able to be advanced again to include gentle theraband strengthening. Patient will benefit from skilled physical therapy to improve ROM and strength and decrease pain for improved quality of life.     OBJECTIVE IMPAIRMENTS: Abnormal  gait, decreased endurance, decreased mobility, decreased ROM, decreased strength, hypomobility, increased edema, increased fascial restrictions, impaired perceived functional ability, increased muscle spasms, impaired flexibility, impaired UE functional use, improper body mechanics, postural dysfunction, and pain.   ACTIVITY LIMITATIONS: carrying, lifting, bed mobility, bathing, toileting, dressing, self feeding, reach over head, hygiene/grooming, and caring for others  PARTICIPATION LIMITATIONS: meal prep, cleaning, laundry, medication management, shopping, community activity, and yard work  PERSONAL FACTORS: Age, Fitness, Past/current experiences, Time since onset of injury/illness/exacerbation, and 3+ comorbidities: anemia, anxiety, breast cancer, CKD, claustrophobia, depression, migraines, OA, PONV, sleep apnea, stroke, lumbar fusion, mastectomy.   are also affecting patient's functional outcome.   REHAB POTENTIAL: Good  CLINICAL DECISION MAKING: Evolving/moderate complexity  EVALUATION COMPLEXITY: Moderate   GOALS: Goals reviewed with patient? Yes  SHORT TERM GOALS: Target date: 07/25/2023    Patient will be independent in home exercise program to improve strength/mobility for better functional independence with ADLs. Baseline: 10/23: give next session  Goal status: INITIAL    LONG TERM GOALS: Target date: 09/19/2023    Patient will increase FOTO score to equal to or greater than 49    to demonstrate statistically significant improvement in mobility and quality of life.  Baseline: 32 Goal status: INITIAL  2.  Patient will report a worst pain of 3/10 on VAS in L shoulder  to improve tolerance with ADLs and reduced symptoms with activities.  Baseline: 7/10  Goal status: INITIAL  3.  Patient will improve shoulder AROM to > 140 degrees of flexion, scaption, and abduction for improved ability to perform overhead activities. Baseline: see above Goal status: INITIAL  4.   Patient will improve shoulder strength to 4/5 for improved functional ADL and iADL performance Baseline: 36/64: unable to perform today due to pain  and ROM limitations Goal status: INITIAL  5. Patient will increase six minute walk test distance to >1000 for progression to community ambulator and improve gait ability  Baseline: 652 ft with SPC  Goal status: INITIAL   PLAN:  PT FREQUENCY: 2x/week  PT DURATION: 12 weeks  PLANNED INTERVENTIONS: 97164- PT Re-evaluation, 97110-Therapeutic exercises, 97530- Therapeutic activity, 97112- Neuromuscular re-education, 97535- Self Care, 41324- Manual therapy, 97116- Gait training, 97014- Electrical stimulation (unattended), [phone]- Electrical stimulation (manual), [id number]- Traction (mechanical), Patient/Family education, Balance training, Taping, Joint mobilization, Spinal mobilization, Manual lymph drainage, Scar mobilization, Compression bandaging, Vestibular training, Visual/preceptual remediation/compensation, Cognitive remediation, Cryotherapy, Moist heat, and Biofeedback  PLAN FOR NEXT SESSION:  ROM Of shoulder, isometrics, progress HEP as pt able   Baird Kay, PT 08/16/2023, 5:51 PM

## 2023-08-17 ENCOUNTER — Telehealth: Payer: Self-pay | Admitting: Physician Assistant

## 2023-08-17 NOTE — Telephone Encounter (Signed)
Patient called stating she had surgery completed December 2023 at [facility] but the provider is now out of network with her insurance. She had a fall 6 weeks ago and is still in a lot of pain, her primary care recommended she follow up with Dr.Yarbrough or Dr.Smith.  Patient had an x-ray completed 12.2.24 at [facility]; no physical therapy or injections within the last year.   Vanessa Medina has an opening slot Monday morning at 10:00 if the patient should be scheduled with a PA, please advise

## 2023-08-17 NOTE — Progress Notes (Unsigned)
Referring Physician:  [name], MD [address] [place],  [place] [address]  Primary Physician:  [name], MD  History of Present Illness: 08/20/2023 Vanessa Medina has a past medical history of breast cancer with reconstruction in 2024.  She also has longstanding back pain and underwent a L2-5 TLIF in 2020.  She then underwent a spinal cord stimulator trial in 2021 in which she had wonderful relief for 2 weeks.  Unfortunately when they went to place the permanent there is a surgical complication and the stimulator was removed and which caused permanent pain and weakness in the right lower extremity.  She then underwent a revision of her L2-5 TLIF in December 2023.  She continues to have mid and low back pain that radiates to bilateral legs, right worse than left.  This was complicated by a fall in which she suffered 6 weeks ago when she was at her sewing table.  She has severe numbness tingling and burning extending on the outside and back of her right leg down to her toes.  She also has numbness and tingling in the back of her left leg although is not as severe.  She feels as though her pain is so extreme that she has weakness.  She uses a cane to ambulate.  The only thing to help with pain is to lean forward.  No bowel or bladder dysfunction.  No saddle anesthesia.   The symptoms are causing a significant impact on the patient's life.   Review of Systems:  A 10 point review of systems is negative, except for the pertinent positives and negatives detailed in the HPI.  Past Medical History: Past Medical History:  Diagnosis Date   Acute nontraumatic kidney injury (HCC)    Anemia    Anxiety    Benign cyst of kidney    Breast cancer (HCC) 01/18/2023   left breast   Cervical cancer (HCC)    Chronic hypotension    Chronic kidney disease    Previous now clear   Claustrophobia    Decompression injury of spinal cord    Depression    Family history of  adverse reaction to anesthesia     " my mother takes a long time time wake up"   History of dysplastic nevus 01/18/2018   right shoulder, recurrent dysplastic nevus   History of kidney stones    History of shingles    Migraine    Multiple allergies    Obesity    Osteoarthritis    left hip   Pneumonia    PONV (postoperative nausea and vomiting)    slow to wake up   Sleep apnea    does not wear CPAP   Slow to wake up after anesthesia    Stroke (HCC) 08/11/2020   spinal cord stroke, spinal cord puncture during surgery   Wears glasses     Past Surgical History: Past Surgical History:  Procedure Laterality Date   ABDOMINAL HYSTERECTOMY     APPENDECTOMY     BACK SURGERY     BILIOPANCREATIC DIVISION W DUODENAL SWITCH N/A    BREAST BIOPSY Left 12/06/2022   u/s bx,1:00 heart clip, path pending   BREAST BIOPSY Left 12/06/2022   [name] bx 2:00 ribbon clip path pending   BREAST BIOPSY Left 12/06/2022   [name] LT BREAST BX W LOC DEV EA ADD LESION IMG BX SPEC US GUIDE 12/06/2022 [facility]   BREAST BIOPSY Left 12/06/2022   [name] LT  BREAST BX W LOC DEV 1ST LESION IMG BX SPEC US GUIDE 12/06/2022 [facility]   BREAST RECONSTRUCTION WITH PLACEMENT OF TISSUE EXPANDER AND FLEX HD (ACELLULAR HYDRATED DERMIS) Bilateral 01/18/2023   Procedure: IMMEDIATE LEFT BREAST RECONSTRUCTION WITH PLACEMENT OF TISSUE EXPANDER AND FLEX HD (ACELLULAR HYDRATED DERMIS);  Surgeon: Peggye Form, DO;  Location: [facility];  Service: Plastics;  Laterality: Bilateral;   BREAST RECONSTRUCTION WITH PLACEMENT OF TISSUE EXPANDER AND FLEX HD (ACELLULAR HYDRATED DERMIS) Left 03/26/2023   Procedure: BREAST RECONSTRUCTION WITH PLACEMENT OF TISSUE EXPANDER;  Surgeon: Peggye Form, DO;  Location: [facility];  Service: Plastics;  Laterality: Left;   CHOLECYSTECTOMY     COLONOSCOPY N/A 06/27/2021   Procedure: COLONOSCOPY;  Surgeon: Jaynie Collins, DO;  Location: [facility];  Service:  Gastroenterology;  Laterality: N/A;   DIAGNOSTIC LAPAROSCOPY     LOA   ESOPHAGOGASTRODUODENOSCOPY N/A 06/27/2021   Procedure: ESOPHAGOGASTRODUODENOSCOPY (EGD);  Surgeon: Jaynie Collins, DO;  Location: [facility];  Service: Gastroenterology;  Laterality: N/A;   ESOPHAGOGASTRODUODENOSCOPY (EGD) WITH PROPOFOL N/A 07/25/2017   Procedure: ESOPHAGOGASTRODUODENOSCOPY (EGD) WITH PROPOFOL;  Surgeon: Scot Jun, MD;  Location: [facility] ENDOSCOPY;  Service: Endoscopy;  Laterality: N/A;   FLEXIBLE BRONCHOSCOPY     HERNIA REPAIR     KNEE ARTHROSCOPY     LAPAROSCOPIC GASTRIC SLEEVE RESECTION WITH HIATAL HERNIA REPAIR     LUMBAR FUSION  08/29/2022   Revision Lumbar two through five Laminectomy & Fusion with Transforaminal Lumbar interbody fusion   MASTECTOMY W/ SENTINEL NODE BIOPSY Bilateral 01/18/2023   Procedure: MASTECTOMY WITH SENTINEL LYMPH NODE BIOPSY, RNFA to assist;  Surgeon: Leafy Ro, MD;  Location: [facility];  Service: General;  Laterality: Bilateral;   REMOVAL OF TISSUE EXPANDER AND PLACEMENT OF IMPLANT Bilateral 06/06/2023   Procedure: REMOVAL OF TISSUE EXPANDER AND PLACEMENT OF IMPLANT;  Surgeon: Peggye Form, DO;  Location: [facility];  Service: Plastics;  Laterality: Bilateral;   SPINAL CORD STIMULATOR IMPLANT     TONSILLECTOMY     TOTAL HIP ARTHROPLASTY Left 01/26/2016   Procedure: LEFT TOTAL HIP ARTHROPLASTY ANTERIOR APPROACH;  Surgeon: Tarry Kos, MD;  Location: [facility];  Service: Orthopedics;  Laterality: Left;   TRANSFORAMINAL LUMBAR INTERBODY FUSION (TLIF) WITH PEDICLE SCREW FIXATION 3 LEVEL  08/2019   Vira Browns, MD L2-L5    Allergies: Allergies as of 08/20/2023 - Review Complete 08/20/2023  Allergen Reaction Noted   Bee venom Swelling 08/15/2022   Iodinated contrast media Anaphylaxis 01/19/2016   Latex Hives and Rash 10/12/2022   Morphine Other (See Comments) 11/10/2022   Shellfish allergy Anaphylaxis 01/25/2016   Codeine Nausea And  Vomiting 11/10/2022   Meperidine hcl Other (See Comments)    Bee pollen Other (See Comments) 01/25/2016   Oxycodone Hives and Itching 08/12/2020   Tape Hives 03/15/2023   Pentazocine lactate Nausea And Vomiting    Propoxyphene Nausea Only and Nausea And Vomiting 01/07/2018    Medications: Outpatient Encounter Medications as of 08/20/2023  Medication Sig   azelastine (ASTELIN) 0.1 % nasal spray Place 1 spray into both nostrils 2 (two) times daily. Use in each nostril as directed   baclofen (LIORESAL) 10 MG tablet Take 1 tablet (10 mg total) by mouth 3 (three) times daily. - for spasms   diazepam (VALIUM) 2 MG tablet Take 1 tablet (2 mg total) by mouth every 12 (twelve) hours as needed for up to 5 doses for muscle spasms.   diclofenac Sodium (VOLTAREN) 1 %  GEL Apply 2 g topically 4 (four) times daily.   Dulaglutide 1.5 MG/0.5ML SOAJ Inject 1.5 mg into the skin once a week.   EPINEPHrine 0.3 mg/0.3 mL IJ SOAJ injection    fluticasone (FLONASE) 50 MCG/ACT nasal spray Place 2 sprays into both nostrils daily.   gabapentin (NEURONTIN) 300 MG capsule Take 1 capsule (300 mg total) by mouth at bedtime. For nerve pain   letrozole (FEMARA) 2.5 MG tablet Take 1 tablet (2.5 mg total) by mouth daily.   methocarbamol (ROBAXIN) 500 MG tablet Take 500 mg by mouth 4 (four) times daily.   montelukast (SINGULAIR) 10 MG tablet TAKE 1 TABLET BY MOUTH AT  BEDTIME FOR ALLERGIES   Multiple Vitamin (MULTIVITAMIN WITH MINERALS) TABS tablet Take 1 tablet by mouth 2 (two) times daily.   pramipexole (MIRAPEX) 0.125 MG tablet Take by mouth.   pyridOXINE (VITAMIN B-6) 100 MG tablet Take 1 tablet (100 mg total) by mouth daily.   thiamine 250 MG tablet Take 250 mg by mouth daily.   topiramate (TOPAMAX) 100 MG tablet TAKE 1 TABLET BY MOUTH AT  BEDTIME   traMADol (ULTRAM) 50 MG tablet Take 1 tablet (50 mg total) by mouth every 12 (twelve) hours as needed.   vitamin B-12 (CYANOCOBALAMIN) 1000 MCG tablet Take 1 tablet (1,000  mcg total) by mouth daily.   [DISCONTINUED] tamoxifen (NOLVADEX) 20 MG tablet Take 1 tablet (20 mg total) by mouth daily.   No facility-administered encounter medications on file as of 08/20/2023.    Social History: Social History   Tobacco Use   Smoking status: Never    Passive exposure: Never   Smokeless tobacco: Never  Vaping Use   Vaping status: Never Used  Substance Use Topics   Alcohol use: Not Currently    Comment: occasional wine   Drug use: No    Family Medical History: Family History  Problem Relation Age of Onset   Renal Disease Mother    Hypertension Mother    Sudden Cardiac Death Mother    Heart failure Mother    Valvular heart disease Mother    Heart disease Mother    Breast cancer Maternal Grandmother    Stroke Brother    Heart attack Brother 65   Diabetes Other     Physical Examination:   General: Patient is well developed, well nourished, calm, collected, and in no apparent distress. Attention to examination is appropriate.  Psychiatric: Patient is non-anxious.  Head:  Pupils equal, round, and reactive to light.  ENT:  Oral mucosa appears well hydrated.  Neck:   Supple.    Respiratory: Patient is breathing without any difficulty.  Extremities: No edema.  Vascular: Palpable dorsal pedal pulses.  Skin:   On exposed skin, there are no abnormal skin lesions.  NEUROLOGICAL:     Awake, alert, oriented to person, place, and time.  Speech is clear and fluent. Fund of knowledge is appropriate.   Cranial Nerves: Pupils equal round and reactive to light.  Facial tone is symmetric.   ROM of spine: Decreased range of motion of lumbar spine.  Patient is tender to palpation of her thoracic and lumbar spine.  Strength:  Side Iliopsoas Quads Hamstring PF DF EHL  R 5 5 5  4-4+ 4-4+ 4-4+  L 5 5 5 5 5 5     Somewhat difficult examination of bilateral lower extremities secondary to patient's pain.  Patient also has a tremor in bilateral  extremities.  Difficulty obtaining reflexes of her lower extremity.  Appears  to be hyporeflexic in both bilateral patellar and Achilles reflexes.  Unable to obtain hamstring reflex. Clonus is not present.  Toes are down-going.  Patient does have some decreased sensation to bilateral lower extremities Gait is altered and patient ambulates with a cane  Medical Decision Making  Imaging:  EXAM: LUMBAR SPINE - 2 VIEW   COMPARISON:  08/23/2022.   FINDINGS: There is no evidence of lumbar spine fracture. Alignment is normal. Status post posterior fusion and discectomies L2-L5. Disc space narrowing and marginal osteophytes at each lumbar level.   IMPRESSION: Postoperative and degenerative change  EXAM: THORACIC SPINE 2 VIEWS  COMPARISON: 09/03/2017.  FINDINGS: There is no evidence of thoracic spine fracture. Alignment is normal. Disc space narrowing and small marginal osteophytes consistent with multilevel degenerative disc disease  IMPRESSION: Negativ    I have personally reviewed the images and agree with the above interpretation.  Assessment and Plan: Ms. Cochenour is a pleasant 52 y.o. female has a past medical history of breast cancer with reconstruction in 2024.  She also has longstanding back pain and underwent a L2-5 TLIF in 2020.  She then underwent a spinal cord stimulator trial in 2021 in which she had wonderful relief for 2 weeks.  Unfortunately when they went to place the permanent there is a surgical complication and the stimulator was removed and which caused permanent pain and weakness in the right lower extremity.  She then underwent a revision of her L2-5 TLIF in December 2023.  She continues to have mid and low back pain that radiates to bilateral legs, right worse than left.  This was complicated by a fall in which she suffered 6 weeks ago when she was at her sewing table.  She has severe numbness tingling and burning extending on the outside and back of her right leg  down to her toes.  She also has numbness and tingling in the back of her left leg although is not as severe.  She feels as though her pain is so extreme that she has weakness.  She uses a cane to ambulate.  The only thing to help with pain is to lean forward.  No bowel or bladder dysfunction.  No saddle anesthesia.  Patient is tender to palpation over thoracic and lumbar spine on examination.  She does have some baseline weakness in her right lower extremity although it was difficult to fully test for strength on her examination.  No clonus present.The symptoms are causing a significant impact on the patient's life.   Pleasure to see patient in clinic today.  Due to patient's concurrent breast cancer diagnosis and new pain, would like thoracic and lumbar spine MRI.  A lumbar spine MRI with and without contrast has already been ordered and I have added on a thoracic MRI as well.  Records were reviewed from her previous spinal cord stimulator trial.  Looking to obtain the notes on these for further detail.  Patient states that stimulator was [employer].  At this time any surgical revision of her current hardware will be quite an invasive surgery.  Patient is very interested in undergoing a spinal cord stimulator placement again because of how much relief she had 3 years ago and her desire to avoid any major back surgeries in the near future.  Plan for psych eval and additional physical therapy with plan to place stimulator at the turn of the new year once other evaluations are complete.  She currently has a pain medicine appointment scheduled for  this week.  She was encouraged to reach out to [name] for questions or concerns that she has in the meantime.   Thank you for involving me in the care of this patient.   Joan Flores, PA-C Dept. of Neurosurgery

## 2023-08-20 ENCOUNTER — Ambulatory Visit (INDEPENDENT_AMBULATORY_CARE_PROVIDER_SITE_OTHER): Payer: Medicaid Other | Admitting: Physician Assistant

## 2023-08-20 VITALS — BP 120/78 | Ht 64.0 in | Wt 249.0 lb

## 2023-08-20 DIAGNOSIS — Z87828 Personal history of other (healed) physical injury and trauma: Secondary | ICD-10-CM

## 2023-08-20 DIAGNOSIS — M5416 Radiculopathy, lumbar region: Secondary | ICD-10-CM | POA: Diagnosis not present

## 2023-08-20 DIAGNOSIS — M549 Dorsalgia, unspecified: Secondary | ICD-10-CM | POA: Diagnosis not present

## 2023-08-20 DIAGNOSIS — C50812 Malignant neoplasm of overlapping sites of left female breast: Secondary | ICD-10-CM

## 2023-08-20 DIAGNOSIS — R29898 Other symptoms and signs involving the musculoskeletal system: Secondary | ICD-10-CM | POA: Diagnosis not present

## 2023-08-20 DIAGNOSIS — M4726 Other spondylosis with radiculopathy, lumbar region: Secondary | ICD-10-CM

## 2023-08-20 DIAGNOSIS — Z981 Arthrodesis status: Secondary | ICD-10-CM

## 2023-08-20 DIAGNOSIS — Z170 Estrogen receptor positive status [ER+]: Secondary | ICD-10-CM

## 2023-08-20 DIAGNOSIS — S34109S Unspecified injury to unspecified level of lumbar spinal cord, sequela: Secondary | ICD-10-CM

## 2023-08-20 NOTE — Patient Instructions (Signed)
[facility] - televisits [phone] Not in network with [facility] - televisits [phone] Not in network with [facility] in [place] [phone] Accepts [facility] Dr Kieth Brightly in [place] [phone] Accepts [facility], but is typically booked out about 10 months Dr Orie Fisherman in [place] - also does televisits [phone] Dr Irish Lack in [place] [phone] Dr Mindi Slicker in [place] - also does televisits [phone] [facility] (offices in [place], [place], and [place]) [phone]   Please let us know which one of the above psychologist's you would like to see for evaluation prior to the spinal cord stimulator trial. These are the only providers we are aware of that perform this type of evaluation. Once we fax the referral, please call them to set up an appointment (they do not typically call you).

## 2023-08-22 ENCOUNTER — Encounter: Payer: Self-pay | Admitting: Physical Medicine and Rehabilitation

## 2023-08-22 ENCOUNTER — Encounter
Payer: Medicaid Other | Attending: Physical Medicine and Rehabilitation | Admitting: Physical Medicine and Rehabilitation

## 2023-08-22 VITALS — BP 133/78 | HR 63 | Ht 64.0 in | Wt 250.0 lb

## 2023-08-22 DIAGNOSIS — C50812 Malignant neoplasm of overlapping sites of left female breast: Secondary | ICD-10-CM | POA: Insufficient documentation

## 2023-08-22 DIAGNOSIS — G8220 Paraplegia, unspecified: Secondary | ICD-10-CM | POA: Diagnosis present

## 2023-08-22 DIAGNOSIS — Z170 Estrogen receptor positive status [ER+]: Secondary | ICD-10-CM | POA: Insufficient documentation

## 2023-08-22 DIAGNOSIS — M7918 Myalgia, other site: Secondary | ICD-10-CM | POA: Insufficient documentation

## 2023-08-22 DIAGNOSIS — R296 Repeated falls: Secondary | ICD-10-CM | POA: Diagnosis present

## 2023-08-22 DIAGNOSIS — G894 Chronic pain syndrome: Secondary | ICD-10-CM | POA: Diagnosis not present

## 2023-08-22 MED ORDER — DULOXETINE HCL 60 MG PO CPEP: 120.0000 mg | ORAL_CAPSULE | Freq: Every day | ORAL | 3 refills | Status: AC

## 2023-08-22 MED ORDER — LIDOCAINE HCL 1 % IJ SOLN
9.0000 mL | Freq: Once | INTRAMUSCULAR | Status: AC
  Administered 2023-08-22: 9 mL

## 2023-08-22 NOTE — Progress Notes (Signed)
Pt is a 52 yr old female with paraparesis/incomplete paraplegia due to attempted spinal cord stimulator placement- With chronic hx of orthostasis, BMI of 41 - back dwon from 45-  s/p gastric sleeve, Acute on chronic renal issues- CKD stage II,  here for f/u on SCI and neurogenic orthostatic hypotension. Vestibular issues due to trigger points and central issues.  Also had surgery on back in December 2023- L2-L3 laminectomy- at [facility]- Dr Sharolyn Douglas- Spine and Scoliosis- in [place], is Ortho spine per pt.  Here for f/u on ASIA D incomplete paraplegia and chronic pain.   Here for trigger point injections as well.        Getting over a cold!  Getting along-   Getting into recovery- from the breast CA.   Have had 2 more falls since last saw her- lost balance- soreness pain afterwards-  1 time fell down stairs- 3 stairs- and sore all over from that one.  Was 2 months ago Other time- fell in house- ground level fall- lost balance-  in hallway- got bruises on knees.  That one was 1 month ago-   Is using cane when walks-    Just started seeing  a new [facility]- [facility] in [place] doesn't take  insurance.  They are talking about a spinal cord stimulator- to relieve pain.  New doctors suggesting doing open surgery and so they can see where they are placing leads.  Dr Katrinka Blazing in [facility]- Ernestine Mcmurray- [facility].   Considering the trouble she's had, real concerned about doing more surgery.  The only time gets relief when leans forward- same way was before had SCI due to attempted spinal cord stimulator placement.   Doesn't want to go route of needing multi level surgery with lots of rods- doesn't want this.   Has ordered PT for her to go to- starts as soon as scheduled- should get call "any day".  At [facility]! Same place has gone in past.   There for therapy for reconstruction surgery on Chest/shoulders.    Pain form reconstruction surgery after breast CA doing better- doing Jovy pack- type of insert into  bra- that helps with swelling and tissue issues/edema after surgery.   Last surgery 06/26/23 for implants to be placed in.   Not getting pain meds.  Doing ok without pain meds.    Changed cancer meds- was having really hard time on Tamoxifen- side effects and interference with meds she was on.    Put on Femara/Letrozole- doing a lot better on that one.   Taking on Duloxetine- but put back on it- (when got off Tamoxifen) but put back on- and noticed a huge difference in pain when got to go back on it.   Got injections in B/L knees on 08/10/23 for OA/DJD- and also given Rx for Tramadol at that time- for 30 tramadol and 1 refill- did get it filled.  Always gotten from Ortho Takes  only if needed- rarely- a few times/month.        Plan: Might need to go back to rolator for awhile. Due to frequent falls.  I'm concerned that she might really get hurt. Until stronger and better balance.   2. Con't Gabapentin 300 mg TID- has refills for 9 more months   3. Con't Duloxetine 120 mg daily- Rx from me- rewrote Rx for 1 year with 3 months supply at a time   4. Con't Baclofen 10 mg 3x/day- has Rx- for 1 year written in 9/24.  5. Patient here for trigger point injections for myofascial pain- secondary   Consent done and on chart.  Cleaned areas with alcohol and injected using a 27 gauge 1.5 inch needle  Injected  9cc- wasted 1.5cc Using 1% Lidocaine with no EPI  Upper traps B/L  Levators- B/L  Posterior scalenes Middle scalenes- b/L  Splenius Capitus- B/L  Pectoralis Major- CANNOT DO! Rhomboids Infraspinatus Teres Major/minor Thoracic paraspinals- B/L x2 Lumbar paraspinals- B/L  Other injections- also did top of scalenes- for vertigo Sx's.    Patient's level of pain prior was- 6/10 Current level of pain after injections is- pain about the same  There was no bleeding or complications.  Patient was advised to drink a lot of water on day after injections to flush  system Will have increased soreness for 12-48 hours after injections.  Can use Lidocaine patches the day AFTER injections Can use theracane on day of injections in places didn't inject Can use heating pad 4-6 hours AFTER injections  6. Discussed pt's breast Cancer and her nerve pain and need for surgeries/spinal cord simulator.   7.  F/U in 3 months- for f/u on incomplete paraplegia as well as trp injections.    I spent a total of 38   minutes on total care today- >50% coordination of care- due to  6 minutes on injections- rest discussing her medical issues as detailed above. Marland Kitchen

## 2023-08-22 NOTE — Patient Instructions (Addendum)
Plan: Might need to go back to rolator for awhile. Due to frequent falls.  I'm concerned that she might really get hurt. Until stronger and better balance.   2. Con't Gabapentin 300 mg TID- has refills for 9 more months   3. Con't Duloxetine 120 mg daily- Rx from me- rewrote Rx for 1 year with 3 months supply at a time   4. Con't Baclofen 10 mg 3x/day- has Rx- for 1 year written in 9/24.    5. Patient here for trigger point injections for myofascial pain- secondary   Consent done and on chart.  Cleaned areas with alcohol and injected using a 27 gauge 1.5 inch needle  Injected  9cc- wasted 1.5cc Using 1% Lidocaine with no EPI  Upper traps B/L  Levators- B/L  Posterior scalenes Middle scalenes- b/L  Splenius Capitus- B/L  Pectoralis Major- CANNOT DO! Rhomboids Infraspinatus Teres Major/minor Thoracic paraspinals- B/L x2 Lumbar paraspinals- B/L  Other injections- also did top of scalenes- for vertigo Sx's.    Patient's level of pain prior was- 6/10 Current level of pain after injections is- pain about the same  There was no bleeding or complications.  Patient was advised to drink a lot of water on day after injections to flush system Will have increased soreness for 12-48 hours after injections.  Can use Lidocaine patches the day AFTER injections Can use theracane on day of injections in places didn't inject Can use heating pad 4-6 hours AFTER injections  6. Discussed pt's breast Cancer and her nerve pain and need for surgeries/spinal cord simulator.   7.  F/U in 3 months- for f/u on incomplete paraplegia as well as trp injections.   8. Can get pain meds from Dr Katrinka Blazing after stimulator, since I don't write opiates for her.

## 2023-08-23 ENCOUNTER — Ambulatory Visit: Payer: Medicaid Other

## 2023-08-23 DIAGNOSIS — M6281 Muscle weakness (generalized): Secondary | ICD-10-CM

## 2023-08-23 DIAGNOSIS — G8929 Other chronic pain: Secondary | ICD-10-CM

## 2023-08-23 DIAGNOSIS — R262 Difficulty in walking, not elsewhere classified: Secondary | ICD-10-CM

## 2023-08-23 DIAGNOSIS — R2681 Unsteadiness on feet: Secondary | ICD-10-CM

## 2023-08-23 DIAGNOSIS — M25612 Stiffness of left shoulder, not elsewhere classified: Secondary | ICD-10-CM | POA: Diagnosis not present

## 2023-08-23 DIAGNOSIS — M5459 Other low back pain: Secondary | ICD-10-CM

## 2023-08-23 NOTE — Therapy (Signed)
OUTPATIENT PHYSICAL THERAPY SHOULDER and ORTHO TREATMENT NOTE/RECERT/Physical Therapy Progress Note   Dates of reporting period  06/27/2023   to   08/23/2023    Patient Name: Vanessa Medina MRN: [id number] DOB:[birth year], 52 y.o., female Today's Date: 08/23/2023  END OF SESSION:  PT End of Session - 08/23/23 1106     Visit Number 10    Number of Visits 34    Date for PT Re-Evaluation 11/15/23    Progress Note Due on Visit 10    PT Start Time 1104    PT Stop Time 1145    PT Time Calculation (min) 41 min    Equipment Utilized During Treatment Gait belt    Activity Tolerance Patient tolerated treatment well;Patient limited by pain;Patient limited by fatigue    Behavior During Therapy [facility] for tasks assessed/performed                     Past Medical History:  Diagnosis Date   Acute nontraumatic kidney injury (HCC)    Anemia    Anxiety    Benign cyst of kidney    Breast cancer (HCC) 01/18/2023   left breast   Cervical cancer (HCC)    Chronic hypotension    Chronic kidney disease    Previous now clear   Claustrophobia    Decompression injury of spinal cord    Depression    Family history of adverse reaction to anesthesia     " my mother takes a long time time wake up"   History of dysplastic nevus 01/18/2018   right shoulder, recurrent dysplastic nevus   History of kidney stones    History of shingles    Migraine    Multiple allergies    Obesity    Osteoarthritis    left hip   Pneumonia    PONV (postoperative nausea and vomiting)    slow to wake up   Sleep apnea    does not wear CPAP   Slow to wake up after anesthesia    Stroke (HCC) 08/11/2020   spinal cord stroke, spinal cord puncture during surgery   Wears glasses    Past Surgical History:  Procedure Laterality Date   ABDOMINAL HYSTERECTOMY     APPENDECTOMY     BACK SURGERY     BILIOPANCREATIC DIVISION W DUODENAL SWITCH N/A    BREAST BIOPSY Left 12/06/2022   u/s bx,1:00 heart clip,  path pending   BREAST BIOPSY Left 12/06/2022   [name] bx 2:00 ribbon clip path pending   BREAST BIOPSY Left 12/06/2022   [name] LT BREAST BX W LOC DEV EA ADD LESION IMG BX SPEC US GUIDE 12/06/2022 [facility]   BREAST BIOPSY Left 12/06/2022   [name] LT BREAST BX W LOC DEV 1ST LESION IMG BX SPEC US GUIDE 12/06/2022 [facility]   BREAST RECONSTRUCTION WITH PLACEMENT OF TISSUE EXPANDER AND FLEX HD (ACELLULAR HYDRATED DERMIS) Bilateral 01/18/2023   Procedure: IMMEDIATE LEFT BREAST RECONSTRUCTION WITH PLACEMENT OF TISSUE EXPANDER AND FLEX HD (ACELLULAR HYDRATED DERMIS);  Surgeon: Peggye Form, DO;  Location: [facility];  Service: Plastics;  Laterality: Bilateral;   BREAST RECONSTRUCTION WITH PLACEMENT OF TISSUE EXPANDER AND FLEX HD (ACELLULAR HYDRATED DERMIS) Left 03/26/2023   Procedure: BREAST RECONSTRUCTION WITH PLACEMENT OF TISSUE EXPANDER;  Surgeon: Peggye Form, DO;  Location: [facility];  Service: Plastics;  Laterality: Left;   CHOLECYSTECTOMY     COLONOSCOPY N/A 06/27/2021   Procedure: COLONOSCOPY;  Surgeon: Jaynie Collins, DO;  Location:  [facility];  Service: Gastroenterology;  Laterality: N/A;   DIAGNOSTIC LAPAROSCOPY     LOA   ESOPHAGOGASTRODUODENOSCOPY N/A 06/27/2021   Procedure: ESOPHAGOGASTRODUODENOSCOPY (EGD);  Surgeon: Jaynie Collins, DO;  Location: [facility];  Service: Gastroenterology;  Laterality: N/A;   ESOPHAGOGASTRODUODENOSCOPY (EGD) WITH PROPOFOL N/A 07/25/2017   Procedure: ESOPHAGOGASTRODUODENOSCOPY (EGD) WITH PROPOFOL;  Surgeon: Scot Jun, MD;  Location: [facility];  Service: Endoscopy;  Laterality: N/A;   FLEXIBLE BRONCHOSCOPY     HERNIA REPAIR     KNEE ARTHROSCOPY     LAPAROSCOPIC GASTRIC SLEEVE RESECTION WITH HIATAL HERNIA REPAIR     LUMBAR FUSION  08/29/2022   Revision Lumbar two through five Laminectomy & Fusion with Transforaminal Lumbar interbody fusion   MASTECTOMY W/ SENTINEL NODE BIOPSY Bilateral  01/18/2023   Procedure: MASTECTOMY WITH SENTINEL LYMPH NODE BIOPSY, RNFA to assist;  Surgeon: Leafy Ro, MD;  Location: [facility];  Service: General;  Laterality: Bilateral;   REMOVAL OF TISSUE EXPANDER AND PLACEMENT OF IMPLANT Bilateral 06/06/2023   Procedure: REMOVAL OF TISSUE EXPANDER AND PLACEMENT OF IMPLANT;  Surgeon: Peggye Form, DO;  Location: [facility];  Service: Plastics;  Laterality: Bilateral;   SPINAL CORD STIMULATOR IMPLANT     TONSILLECTOMY     TOTAL HIP ARTHROPLASTY Left 01/26/2016   Procedure: LEFT TOTAL HIP ARTHROPLASTY ANTERIOR APPROACH;  Surgeon: Tarry Kos, MD;  Location: [facility];  Service: Orthopedics;  Laterality: Left;   TRANSFORAMINAL LUMBAR INTERBODY FUSION (TLIF) WITH PEDICLE SCREW FIXATION 3 LEVEL  08/2019   Vira Browns, MD L2-L5   Patient Active Problem List   Diagnosis Date Noted   Frequent falls 08/22/2023   Chronic pain syndrome 08/22/2023   Bilateral lower extremity edema 08/06/2023   Back pain 08/06/2023   Long-term current use of tamoxifen 06/26/2023   Claustrophobia    Pernicious anemia 03/14/2023   Ruptured left breast implant 02/13/2023   S/P mastectomy, bilateral 01/18/2023   Genetic testing 12/18/2022   Breast cancer (HCC) 12/08/2022   Family history of breast cancer 12/08/2022   Profound fatigue 12/08/2022   Lumbar post-laminectomy syndrome 06/02/2022   Pseudarthrosis after fusion or arthrodesis 05/19/2022   Primary osteoarthritis of right knee 03/28/2022   Subjective tinnitus of both ears 12/21/2021   Abnormality of gait 04/29/2021   Abnormal sensation in both ears 04/06/2021   Other adverse food reactions, not elsewhere classified, subsequent encounter 04/06/2021   Multiple drug allergies 04/06/2021   Insomnia due to medical condition 02/25/2021   Myofascial muscle pain 02/25/2021   Nerve pain 10/18/2020   Incomplete paraplegia (HCC) 10/18/2020   Orthostatic hypotension 10/18/2020   Renal cyst 09/23/2020    Chronic migraine without aura without status migrainosus, not intractable    Hypokalemia    Adjustment reaction with anxiety and depression    Spinal cord injury, lumbar, without spinal bone injury, sequela (HCC) 08/18/2020   Paraparesis (HCC)    Post-operative pain    Hypotension    Slow transit constipation    AKI (acute kidney injury) (HCC)    Right leg weakness 08/11/2020   DDD (degenerative disc disease), lumbar 09/02/2019    Class: Chronic   Degenerative disc disease, lumbar 09/02/2019   Chest tightness 09/23/2018   Bradycardia 09/23/2018   BMI 40.0-44.9, adult (HCC) 04/17/2018   Status post bariatric surgery 04/17/2018   Labile blood pressure 03/08/2018   Chronic low back pain 09/03/2017   History of total hip replacement, left 01/26/2016   Osteoarthritis of spine with radiculopathy,  lumbar region 10/16/2014   LEG PAIN, BILATERAL 12/16/2007   Malignant neoplasm of cervix uteri (HCC) 07/02/2007   MORBID OBESITY 07/02/2007   DEPRESSION 07/02/2007   Migraine without aura 07/02/2007   Other allergic rhinitis 07/02/2007   Asthma 07/02/2007   GERD 07/02/2007   ELEVATED BLOOD PRESSURE WITHOUT DIAGNOSIS OF HYPERTENSION 07/02/2007   Asthma 07/02/2007    PCP: [name] MD  REFERRING PROVIDER: Caroline More  REFERRING DIAG: s/p breast reconstruction  THERAPY DIAG:  Muscle weakness (generalized)  Difficulty in walking, not elsewhere classified  Unsteadiness on feet  Chronic left shoulder pain  Other low back pain  Rationale for Evaluation and Treatment: Rehabilitation  ONSET DATE: December 07 2022  SUBJECTIVE:                                                                                                                                                                                      SUBJECTIVE STATEMENT: Pt reports she received jovipak. Pt has new orders for back pain because pt may have upcoming surgery for insertion of spinal cord stimulator. Pt reports  no LUE at rest, but is still pain-limited with activity and feels it more by the end of the day.  Hand dominance: Right  PERTINENT HISTORY: Patient is having LUE ROM issues s/p breast cancer implant reconstruction. Patient underwent bilateral exchange of tissue expanders for implants, bilateral capsulotomies for implant repositioning and right breast capsulectomy with major repositioning of 3 x 8 cm of capsule on 06/06/2023. Patient had back surgery December 26th 2023 and then was diagnosed with breast cancer December 07, 2022. Had surgery Jan 18, 2023 for her breast cancer, had double mastectomy. Patient had two sets of expanders, first one burst, the second one kept flipping and moving. Had to have more work on L side. PMH includes: anemia, anxiety, breast cancer, CKD, claustrophobia, depression, migraines, OA, PONV, sleep apnea, stroke, lumbar fusion, mastectomy.   08/23/2023:  Pt with new orders to address back pain prior to possible surgery for [facility] stimulator. Pt with hx of multiple back surgeries with chronic pain and now worsening pain with decline in mobility. Worst pain reaches 10/10, best 4/10. Pt ideally would like to improve pain. She would also like to improve her ability to walk, reports has not been able to go for walks in a while. Was ambulating without an AD for a while but now must use SPC again. She reports ability to stand, such as to do dishes, is limited due to pain. Pt experiences radiating pain down RLE into R foot and toes.     PAIN:  Are you having pain? Yes: NPRS scale: 4/10 Pain location: L  shoulder and breast Pain description: post surgical Aggravating factors: lift something  Relieving factors: none Worst: 7/10 Least: 2/10    PRECAUTIONS: Other: no lifting greater than 1/2 gallon  , pt reports LATEX ALLERGY   RED FLAGS: None   WEIGHT BEARING RESTRICTIONS:  no more than 1/2 gallon  FALLS:  Has patient fallen in last 6 months? Yes. Number of falls 1  LIVING  ENVIRONMENT: Lives with: lives with their family Lives in: House/apartment Stairs: Yes: External: 2 steps; on right going up and on left going up Has following equipment at home: Single point cane, Walker - 2 wheeled, shower chair, and Grab bars  OCCUPATION: Disability   PLOF: Independent with basic ADLs  PATIENT GOALS:to decrease pain in L shoulder;   NEXT MD VISIT:   OBJECTIVE:  Note: Objective measures were completed at Evaluation unless otherwise noted.  DIAGNOSTIC FINDINGS:  N/a   PATIENT SURVEYS:  FOTO 32  COGNITION: Overall cognitive status:  some short term memory      SENSATION: WFL  POSTURE: Rounding shoulders,   UPPER EXTREMITY ROM:   Active ROM Right eval Left eval  Shoulder flexion 115* 90  Shoulder extension 48 41  Shoulder abduction 81* 89  Shoulder adduction    Shoulder internal rotation    Shoulder external rotation Unable to test in testing position Unable to test in testing position  (Blank rows = not tested) *  UPPER EXTREMITY MMT:   LE MMT: 08/23/23 Gross BLE strength is 4-/5, pain-limited RLE   SHOULDER SPECIAL TESTS: Deferred due to limited ROM   JOINT MOBILITY TESTING:  GH: hypomobile AP, PA, inferior glide bilateral   PALPATION:  Muscle guarding subscap, pectoral, glenohumeral region  Significant fluid on L flank    TODAY'S TREATMENT:                                                                                                                                         DATE: 08/23/23  TA: Goal reassessment completed for progress note and recert to address new orders to treat LBP.  PATIENT EDUCATION: Education details: goals, plan Person educated: Patient Education method: Explanation, Demonstration, Tactile cues, and Verbal cues Education comprehension: verbalized understanding, returned demonstration, verbal cues required, and tactile cues required  HOME EXERCISE PROGRAM:    PREV Access Code: [email or web address] URL:  [email or web address] Date: 08/16/2023 Prepared by: Temple Pacini  Exercises - Standing Shoulder External Rotation with Resistance  - 1 x daily - 5-6 x weekly - 3 sets - 10 reps - Supine Alternating Shoulder Flexion  - 1 x daily - 5-6 x weekly - 3 sets - 10 reps - Seated Shoulder Row with Anchored Resistance  - 1 x daily - 5-6 x weekly - 3 sets - 10 reps   12/2: Access Code: [email or web address] URL: [email or web address] Date: 08/06/2023 Prepared by: Temple Pacini  Exercises - Seated Shoulder Flexion Towel Slide at Table  Top  - 1 x daily - 5-7 x weekly - 2 sets - 10 reps - 15-30 sec hold - Seated Shoulder Abduction Towel Slide at Table Top  - 1 x daily - 5-7 x weekly - 2 sets - 10 reps - 15-30 sec hold  11/26: updated Access Code: [email or web address] URL: [email or web address] Date: 07/31/2023 Prepared by: Temple Pacini  Exercises - Isometric Shoulder Flexion at Wall  - 1 x daily - 7 x weekly - 2 sets - 10 reps - Isometric Shoulder Extension at Wall  - 1 x daily - 7 x weekly - 2 sets - 10 reps - Standing Isometric Shoulder Internal Rotation at Doorway  - 1 x daily - 7 x weekly - 2 sets - 10 reps - Standing Isometric Shoulder External Rotation with Doorway  - 1 x daily - 7 x weekly - 2 sets - 10 reps  Access Code: [email or web address] URL: [email or web address] Date: 07/02/2023 Prepared by: Temple Pacini  Exercises - Seated Shoulder Shrugs  - 2 x daily - 6-7 x weekly - 3 sets - 10 reps - Seated Shoulder Shrug Circles AROM Backward  - 2 x daily - 6-7 x weekly - 3 sets - 10 reps - Seated Scapular Retraction  - 1 x daily - 6-7 x weekly - 3 sets - 10 reps  ASSESSMENT:  CLINICAL IMPRESSION: Goal reassessment completed for progress visit and recert for new orders to address LBP.  PT discussed with pt plan to continue focusing a few sessions on increasing LUE strength/ROM as she still has deficits here. Then plan to incorporate more interventions for her back.  Retesting of goals revealed very slight decrease in FOTO score, but significant decrease in , indicating poor gait ability/functional capacity. Pt partially met LUE ROM goal, and LUE strength now 4-/5. Two new goals have been added to address decreased BLE strength found on assessment and Modified Oswestry score, which currently indicates significant disability. Patient's condition has the potential to improve in response to therapy. Maximum improvement is yet to be obtained. The anticipated improvement is attainable and reasonable in a generally predictable time.   Patient will benefit from skilled physical therapy to improve ROM and strength and decrease pain for improved quality of life.     OBJECTIVE IMPAIRMENTS: Abnormal gait, decreased endurance, decreased mobility, decreased ROM, decreased strength, hypomobility, increased edema, increased fascial restrictions, impaired perceived functional ability, increased muscle spasms, impaired flexibility, impaired UE functional use, improper body mechanics, postural dysfunction, and pain.   ACTIVITY LIMITATIONS: carrying, lifting, bed mobility, bathing, toileting, dressing, self feeding, reach over head, hygiene/grooming, and caring for others  PARTICIPATION LIMITATIONS: meal prep, cleaning, laundry, medication management, shopping, community activity, and yard work  PERSONAL FACTORS: Age, Fitness, Past/current experiences, Time since onset of injury/illness/exacerbation, and 3+ comorbidities: anemia, anxiety, breast cancer, CKD, claustrophobia, depression, migraines, OA, PONV, sleep apnea, stroke, lumbar fusion, mastectomy.   are also affecting patient's functional outcome.   REHAB POTENTIAL: Good  CLINICAL DECISION MAKING: Evolving/moderate complexity  EVALUATION COMPLEXITY: Moderate   GOALS: Goals reviewed with patient? Yes  SHORT TERM GOALS: Target date: 10/04/2023      Patient will be independent in home exercise program to improve  strength/mobility for better functional independence with ADLs. Baseline: 10/23: give next session ;12/19: pt indep Goal status: MET    LONG TERM GOALS: Target date: 11/15/2023    Patient will increase FOTO score to equal to or greater than 49    to demonstrate statistically significant improvement in mobility and quality  of life.  Baseline: 32; 08/23/2023: 31 Goal status: INITIAL  2.  Patient will report a worst pain of 3/10 on VAS in L shoulder  to improve tolerance with ADLs and reduced symptoms with activities.  Baseline: 7/10; 12/19: pt reports worst pain is a 6/10, felt in the evenings Goal status: ONGOING  3.  Patient will improve shoulder AROM to > 140 degrees of flexion, scaption, and abduction for improved ability to perform overhead activities. Baseline:12/19: flexion 160 deg; abduction ~140 pain limited Goal status: PARTIALLY MET  4.  Patient will improve shoulder strength to 4/5 for improved functional ADL and iADL performance Baseline: 95/63: unable to perform today due to pain and ROM limitations;12/19: grossly 4-/5  Goal status: ONGOING  5. Patient will increase six minute walk test distance to >1000 for progression to community ambulator and improve gait ability  Baseline: 652 ft with SPC; 12/19: 391 ft with SPC  Goal status: INITIAL  6. Patient will reduce modified Oswestry score to <50 as to demonstrate decreased disability with ADLs including improved sleeping tolerance, walking/sitting tolerance etc for better mobility with ADLs.  Baseline: 78% Goal status: NEW   7. Patient will increase BLE gross strength to 4+/5 as to improve functional strength for independent gait, increased standing tolerance and increased ADL ability. Baseline:Gross BLE strength is 4-/5, pain-limited RLE  Goal status: NEW   PLAN:  PT FREQUENCY: 2x/week  PT DURATION: 12 weeks  PLANNED INTERVENTIONS: 97164- PT Re-evaluation, 97110-Therapeutic exercises, 97530- Therapeutic activity,  97112- Neuromuscular re-education, 97535- Self Care, 87564- Manual therapy, 97116- Gait training, 97014- Electrical stimulation (unattended), [phone]- Electrical stimulation (manual), [phone]- Traction (mechanical), Patient/Family education, Balance training, Taping, Joint mobilization, Spinal mobilization, Manual lymph drainage, Scar mobilization, Compression bandaging, Vestibular training, Visual/preceptual remediation/compensation, Cognitive remediation, Cryotherapy, Moist heat, and Biofeedback  PLAN FOR NEXT SESSION:  ROM Of shoulder, isometrics, progress HEP as pt able, endurance, LE strengthening if time permits   Baird Kay, PT 08/23/2023, 4:59 PM

## 2023-08-24 ENCOUNTER — Encounter: Payer: Self-pay | Admitting: Gastroenterology

## 2023-08-26 NOTE — H&P (Signed)
Pre-Procedure H&P   Patient ID: Vanessa Medina is a 52 y.o. female.  Gastroenterology Provider: Jaynie Collins, DO  Referring Provider: Tawni Pummel, PA PCP: [name], MD  Date: 08/27/2023  HPI Vanessa Medina is a 52 y.o. female who presents today for Colonoscopy for Colorectal cancer screening .  Patient underwent colonoscopy in October 2022 with left-sided diverticulosis and poor prep.  Normal TI appreciated.  Was recommended 1 year follow-up.  Unfortunately she was diagnosed with breast cancer requiring bilateral mastectomy in the interim.  Currently having daily bowel movements without melena or hematochezia.  She is not on any GLP-1 agents.  No family history of colon cancer or colon polyps. Notes some fecal leakage and lack of control since her back surgery  Status post hysterectomy cholecystectomy and appendectomy   Past Medical History:  Diagnosis Date   Acute nontraumatic kidney injury (HCC)    Anemia    Anxiety    Benign cyst of kidney    Breast cancer (HCC) 01/18/2023   left breast   Cervical cancer (HCC)    Chronic hypotension    Chronic kidney disease    Previous now clear   Claustrophobia    Decompression injury of spinal cord    Depression    Family history of adverse reaction to anesthesia     " my mother takes a long time time wake up"   History of dysplastic nevus 01/18/2018   right shoulder, recurrent dysplastic nevus   History of kidney stones    History of shingles    Migraine    Multiple allergies    Obesity    Osteoarthritis    left hip   Pneumonia    PONV (postoperative nausea and vomiting)    slow to wake up   Sleep apnea    does not wear CPAP   Slow to wake up after anesthesia    Stroke (HCC) 08/11/2020   spinal cord stroke, spinal cord puncture during surgery   Wears glasses     Past Surgical History:  Procedure Laterality Date   ABDOMINAL HYSTERECTOMY     APPENDECTOMY     BACK SURGERY      BILIOPANCREATIC DIVISION W DUODENAL SWITCH N/A    BREAST BIOPSY Left 12/06/2022   u/s bx,1:00 heart clip, path pending   BREAST BIOPSY Left 12/06/2022   [name] bx 2:00 ribbon clip path pending   BREAST BIOPSY Left 12/06/2022   [name] LT BREAST BX W LOC DEV EA ADD LESION IMG BX SPEC US GUIDE 12/06/2022 [facility]   BREAST BIOPSY Left 12/06/2022   [name] LT BREAST BX W LOC DEV 1ST LESION IMG BX SPEC US GUIDE 12/06/2022 [facility]   BREAST RECONSTRUCTION WITH PLACEMENT OF TISSUE EXPANDER AND FLEX HD (ACELLULAR HYDRATED DERMIS) Bilateral 01/18/2023   Procedure: IMMEDIATE LEFT BREAST RECONSTRUCTION WITH PLACEMENT OF TISSUE EXPANDER AND FLEX HD (ACELLULAR HYDRATED DERMIS);  Surgeon: Peggye Form, DO;  Location: [facility];  Service: Plastics;  Laterality: Bilateral;   BREAST RECONSTRUCTION WITH PLACEMENT OF TISSUE EXPANDER AND FLEX HD (ACELLULAR HYDRATED DERMIS) Left 03/26/2023   Procedure: BREAST RECONSTRUCTION WITH PLACEMENT OF TISSUE EXPANDER;  Surgeon: Peggye Form, DO;  Location: [facility];  Service: Plastics;  Laterality: Left;   CHOLECYSTECTOMY     COLONOSCOPY N/A 06/27/2021   Procedure: COLONOSCOPY;  Surgeon: Jaynie Collins, DO;  Location: [facility];  Service: Gastroenterology;  Laterality: N/A;   DIAGNOSTIC LAPAROSCOPY     LOA  ESOPHAGOGASTRODUODENOSCOPY N/A 06/27/2021   Procedure: ESOPHAGOGASTRODUODENOSCOPY (EGD);  Surgeon: Jaynie Collins, DO;  Location: [facility] ENDOSCOPY;  Service: Gastroenterology;  Laterality: N/A;   ESOPHAGOGASTRODUODENOSCOPY (EGD) WITH PROPOFOL N/A 07/25/2017   Procedure: ESOPHAGOGASTRODUODENOSCOPY (EGD) WITH PROPOFOL;  Surgeon: Scot Jun, MD;  Location: [facility]COPY;  Service: Endoscopy;  Laterality: N/A;   FLEXIBLE BRONCHOSCOPY     HERNIA REPAIR     KNEE ARTHROSCOPY     LAPAROSCOPIC GASTRIC SLEEVE RESECTION WITH HIATAL HERNIA REPAIR     LUMBAR FUSION  08/29/2022   Revision Lumbar two through five Laminectomy &  Fusion with Transforaminal Lumbar interbody fusion   MASTECTOMY W/ SENTINEL NODE BIOPSY Bilateral 01/18/2023   Procedure: MASTECTOMY WITH SENTINEL LYMPH NODE BIOPSY, RNFA to assist;  Surgeon: Leafy Ro, MD;  Location: [facility];  Service: General;  Laterality: Bilateral;   REMOVAL OF TISSUE EXPANDER AND PLACEMENT OF IMPLANT Bilateral 06/06/2023   Procedure: REMOVAL OF TISSUE EXPANDER AND PLACEMENT OF IMPLANT;  Surgeon: Peggye Form, DO;  Location: [facility];  Service: Plastics;  Laterality: Bilateral;   SPINAL CORD STIMULATOR IMPLANT     TONSILLECTOMY     TOTAL HIP ARTHROPLASTY Left 01/26/2016   Procedure: LEFT TOTAL HIP ARTHROPLASTY ANTERIOR APPROACH;  Surgeon: Tarry Kos, MD;  Location: [facility];  Service: Orthopedics;  Laterality: Left;   TRANSFORAMINAL LUMBAR INTERBODY FUSION (TLIF) WITH PEDICLE SCREW FIXATION 3 LEVEL  08/2019   Vira Browns, MD L2-L5    Family History No h/o GI disease or malignancy  Review of Systems  Constitutional:  Negative for activity change, appetite change, chills, diaphoresis, fatigue, fever and unexpected weight change.  HENT:  Negative for trouble swallowing and voice change.   Respiratory:  Negative for shortness of breath and wheezing.   Cardiovascular:  Negative for chest pain, palpitations and leg swelling.  Gastrointestinal:  Negative for abdominal distention, abdominal pain, anal bleeding, blood in stool, constipation, diarrhea, nausea, rectal pain and vomiting.  Musculoskeletal:  Negative for arthralgias and myalgias.  Skin:  Negative for color change and pallor.  Neurological:  Negative for dizziness, syncope and weakness.  Psychiatric/Behavioral:  Negative for confusion.   All other systems reviewed and are negative.    Medications No current facility-administered medications on file prior to encounter.   Current Outpatient Medications on File Prior to Encounter  Medication Sig Dispense Refill   azelastine (ASTELIN)  0.1 % nasal spray Place 1 spray into both nostrils 2 (two) times daily. Use in each nostril as directed     EPINEPHrine 0.3 mg/0.3 mL IJ SOAJ injection      fluticasone (FLONASE) 50 MCG/ACT nasal spray Place 2 sprays into both nostrils daily.     montelukast (SINGULAIR) 10 MG tablet TAKE 1 TABLET BY MOUTH AT  BEDTIME FOR ALLERGIES 30 tablet 1   Multiple Vitamin (MULTIVITAMIN WITH MINERALS) TABS tablet Take 1 tablet by mouth 2 (two) times daily.     pramipexole (MIRAPEX) 0.125 MG tablet Take by mouth.     pyridOXINE (VITAMIN B-6) 100 MG tablet Take 1 tablet (100 mg total) by mouth daily. 90 tablet 1   thiamine 250 MG tablet Take 250 mg by mouth daily.     vitamin B-12 (CYANOCOBALAMIN) 1000 MCG tablet Take 1 tablet (1,000 mcg total) by mouth daily. 90 tablet 1    Pertinent medications related to GI and procedure were reviewed by me with the patient prior to the procedure   Current Facility-Administered Medications:    0.9 %  sodium chloride infusion, , Intravenous, Continuous, Jaynie Collins, DO, Last Rate: 20 mL/hr at 08/27/23 0704, 20 mL/hr at 08/27/23 0704  sodium chloride 20 mL/hr (08/27/23 0704)       Allergies  Allergen Reactions   Bee Venom Swelling   Iodinated Contrast Media Anaphylaxis    Swelling  Other reaction(s): Other (See Comments)  Swelling   Swelling   Latex Hives and Rash   Morphine Other (See Comments)    BRADYCARDIA   Shellfish Allergy Anaphylaxis   Codeine Nausea And Vomiting   Meperidine Hcl Other (See Comments)    BRADYCARDIA   Bee Pollen Other (See Comments)    Unknown   Oxycodone Hives and Itching    Blisters on back   Tape Hives    Adhesive   Pentazocine Lactate Nausea And Vomiting    REACTION: vomiting with Talwin NX   Propoxyphene Nausea Only and Nausea And Vomiting   Allergies were reviewed by me prior to the procedure  Objective   Body mass index is 42.91 kg/m. Vitals:   08/27/23 0649  BP: 133/70  Pulse: (!) 56  Resp: 20   Temp: (!) 96.6 F (35.9 C)  TempSrc: Temporal  SpO2: 99%  Weight: 113.4 kg  Height: 5\' 4"  (1.626 m)     Physical Exam Vitals and nursing note reviewed.  Constitutional:      General: She is not in acute distress.    Appearance: Normal appearance. She is obese. She is not ill-appearing, toxic-appearing or diaphoretic.  HENT:     Head: Normocephalic and atraumatic.     Nose: Nose normal.     Mouth/Throat:     Mouth: Mucous membranes are moist.     Pharynx: Oropharynx is clear.  Eyes:     General: No scleral icterus.    Extraocular Movements: Extraocular movements intact.  Cardiovascular:     Rate and Rhythm: Regular rhythm. Bradycardia present.     Heart sounds: Normal heart sounds. No murmur heard.    No friction rub. No gallop.  Pulmonary:     Effort: Pulmonary effort is normal. No respiratory distress.     Breath sounds: Normal breath sounds. No wheezing, rhonchi or rales.  Abdominal:     General: Bowel sounds are normal. There is no distension.     Palpations: Abdomen is soft.     Tenderness: There is no abdominal tenderness. There is no guarding or rebound.  Musculoskeletal:     Cervical back: Neck supple.     Right lower leg: No edema.     Left lower leg: No edema.  Skin:    General: Skin is warm and dry.     Coloration: Skin is not jaundiced or pale.  Neurological:     General: No focal deficit present.     Mental Status: She is alert and oriented to person, place, and time. Mental status is at baseline.  Psychiatric:        Mood and Affect: Mood normal.        Behavior: Behavior normal.        Thought Content: Thought content normal.        Judgment: Judgment normal.      Assessment:  Vanessa Medina is a 51 y.o. female  who presents today for Colonoscopy for Colorectal cancer screening .  Plan:  Colonoscopy with possible intervention today  Colonoscopy with possible biopsy, control of bleeding, polypectomy, and interventions as necessary has  been discussed with the patient/patient representative.  Informed consent was obtained from the patient/patient representative after explaining the indication, nature, and risks of the procedure including but not limited to death, bleeding, perforation, missed neoplasm/lesions, cardiorespiratory compromise, and reaction to medications. Opportunity for questions was given and appropriate answers were provided. Patient/patient representative has verbalized understanding is amenable to undergoing the procedure.   Jaynie Collins, DO  [facility]  Portions of the record may have been created with voice recognition software. Occasional wrong-word or 'sound-a-like' substitutions may have occurred due to the inherent limitations of voice recognition software.  Read the chart carefully and recognize, using context, where substitutions may have occurred.

## 2023-08-27 ENCOUNTER — Ambulatory Visit: Payer: Medicaid Other | Admitting: Certified Registered"

## 2023-08-27 ENCOUNTER — Encounter: Payer: Self-pay | Admitting: Gastroenterology

## 2023-08-27 ENCOUNTER — Encounter
Admission: RE | Disposition: A | Discharge status: Home / Self Care | Payer: Self-pay | Source: Home / Self Care | Attending: Gastroenterology

## 2023-08-27 ENCOUNTER — Ambulatory Visit
Admission: RE | Admit: 2023-08-27 | Discharge: 2023-08-27 | Disposition: A | Discharge status: Home / Self Care | Payer: Medicaid Other | Attending: Gastroenterology | Admitting: Gastroenterology

## 2023-08-27 DIAGNOSIS — E669 Obesity, unspecified: Secondary | ICD-10-CM | POA: Diagnosis not present

## 2023-08-27 DIAGNOSIS — Z853 Personal history of malignant neoplasm of breast: Secondary | ICD-10-CM | POA: Insufficient documentation

## 2023-08-27 DIAGNOSIS — Z9884 Bariatric surgery status: Secondary | ICD-10-CM | POA: Insufficient documentation

## 2023-08-27 DIAGNOSIS — Z6841 Body Mass Index (BMI) 40.0 and over, adult: Secondary | ICD-10-CM | POA: Diagnosis not present

## 2023-08-27 DIAGNOSIS — K640 First degree hemorrhoids: Secondary | ICD-10-CM | POA: Insufficient documentation

## 2023-08-27 DIAGNOSIS — Z9013 Acquired absence of bilateral breasts and nipples: Secondary | ICD-10-CM | POA: Diagnosis not present

## 2023-08-27 DIAGNOSIS — Z8541 Personal history of malignant neoplasm of cervix uteri: Secondary | ICD-10-CM | POA: Insufficient documentation

## 2023-08-27 DIAGNOSIS — Z1211 Encounter for screening for malignant neoplasm of colon: Secondary | ICD-10-CM | POA: Diagnosis present

## 2023-08-27 DIAGNOSIS — G4730 Sleep apnea, unspecified: Secondary | ICD-10-CM | POA: Diagnosis not present

## 2023-08-27 DIAGNOSIS — K5730 Diverticulosis of large intestine without perforation or abscess without bleeding: Secondary | ICD-10-CM | POA: Insufficient documentation

## 2023-08-27 HISTORY — DX: Cyst of kidney, acquired: N28.1

## 2023-08-27 HISTORY — DX: Other hypotension: I95.89

## 2023-08-27 HISTORY — PX: COLONOSCOPY: SHX5424

## 2023-08-27 HISTORY — DX: Caisson disease (decompression sickness), initial encounter: T70.3XXA

## 2023-08-27 HISTORY — DX: Acute kidney failure, unspecified: N17.9

## 2023-08-27 SURGERY — COLONOSCOPY
Anesthesia: General

## 2023-08-27 MED ORDER — PROPOFOL 10 MG/ML IV BOLUS
INTRAVENOUS | Status: DC | PRN
  Administered 2023-08-27 (×2): 100 mg via INTRAVENOUS

## 2023-08-27 MED ORDER — PROPOFOL 500 MG/50ML IV EMUL
INTRAVENOUS | Status: DC | PRN
Start: 2023-08-27 — End: 2023-08-27
  Administered 2023-08-27: 125 ug/kg/min via INTRAVENOUS

## 2023-08-27 MED ORDER — SODIUM CHLORIDE 0.9 % IV SOLN
INTRAVENOUS | Status: DC
  Administered 2023-08-27: 20 mL/h via INTRAVENOUS

## 2023-08-27 MED ORDER — LIDOCAINE HCL (CARDIAC) PF 100 MG/5ML IV SOSY
PREFILLED_SYRINGE | INTRAVENOUS | Status: DC | PRN
  Administered 2023-08-27: 80 mg via INTRAVENOUS

## 2023-08-27 NOTE — Interval H&P Note (Signed)
History and Physical Interval Note: Preprocedure H&P from 08/27/23  was reviewed and there was no interval change after seeing and examining the patient.  Written consent was obtained from the patient after discussion of risks, benefits, and alternatives. Patient has consented to proceed with Colonoscopy with possible intervention   08/27/2023 7:34 AM  Vanessa Medina  has presented today for surgery, with the diagnosis of V76.51 (ICD-9-CM) - Z12.11 (ICD-10-CM) - Colon cancer screening.  The various methods of treatment have been discussed with the patient and family. After consideration of risks, benefits and other options for treatment, the patient has consented to  Procedure(s): COLONOSCOPY (N/A) as a surgical intervention.  The patient's history has been reviewed, patient examined, no change in status, stable for surgery.  I have reviewed the patient's chart and labs.  Questions were answered to the patient's satisfaction.     Jaynie Collins

## 2023-08-27 NOTE — Anesthesia Preprocedure Evaluation (Signed)
Anesthesia Evaluation  Patient identified by MRN, date of birth, ID band Patient awake    Reviewed: Allergy & Precautions, NPO status , Patient's Chart, lab work & pertinent test results  History of Anesthesia Complications (+) PONV, PROLONGED EMERGENCE and history of anesthetic complications  Airway Mallampati: III  TM Distance: <3 FB Neck ROM: full    Dental  (+) Chipped, Poor Dentition, Missing   Pulmonary asthma , sleep apnea    Pulmonary exam normal        Cardiovascular negative cardio ROS Normal cardiovascular exam     Neuro/Psych  Headaches PSYCHIATRIC DISORDERS       Neuromuscular disease CVA    GI/Hepatic Neg liver ROS,GERD  Controlled,,  Endo/Other  negative endocrine ROS    Renal/GU Renal disease  negative genitourinary   Musculoskeletal   Abdominal   Peds  Hematology negative hematology ROS (+)   Anesthesia Other Findings Past Medical History: No date: Acute nontraumatic kidney injury (HCC) No date: Anemia No date: Anxiety No date: Benign cyst of kidney 01/18/2023: Breast cancer (HCC)     Comment:  left breast No date: Cervical cancer (HCC) No date: Chronic hypotension No date: Chronic kidney disease     Comment:  Previous now clear No date: Claustrophobia No date: Decompression injury of spinal cord No date: Depression No date: Family history of adverse reaction to anesthesia     Comment:   " my mother takes a long time time wake up" 01/18/2018: History of dysplastic nevus     Comment:  right shoulder, recurrent dysplastic nevus No date: History of kidney stones No date: History of shingles No date: Migraine No date: Multiple allergies No date: Obesity No date: Osteoarthritis     Comment:  left hip No date: Pneumonia No date: PONV (postoperative nausea and vomiting)     Comment:  slow to wake up No date: Sleep apnea     Comment:  does not wear CPAP No date: Slow to wake up after  anesthesia 08/11/2020: Stroke [facility])     Comment:  spinal cord stroke, spinal cord puncture during surgery No date: Wears glasses  Past Surgical History: No date: ABDOMINAL HYSTERECTOMY No date: APPENDECTOMY No date: BACK SURGERY No date: BILIOPANCREATIC DIVISION W DUODENAL SWITCH; N/A 12/06/2022: BREAST BIOPSY; Left     Comment:  u/s bx,1:00 heart clip, path pending 12/06/2022: BREAST BIOPSY; Left     Comment:  [name] bx 2:00 ribbon clip path pending 12/06/2022: BREAST BIOPSY; Left     Comment:  [name] LT BREAST BX W LOC DEV EA ADD LESION IMG BX SPEC [name]               GUIDE 12/06/2022 [facility] 12/06/2022: BREAST BIOPSY; Left     Comment:  [name] LT BREAST BX W LOC DEV 1ST LESION IMG BX SPEC [name]               GUIDE 12/06/2022 [facility] 01/18/2023: BREAST RECONSTRUCTION WITH PLACEMENT OF TISSUE EXPANDER  AND FLEX HD (ACELLULAR HYDRATED DERMIS); Bilateral     Comment:  Procedure: IMMEDIATE LEFT BREAST RECONSTRUCTION WITH               PLACEMENT OF TISSUE EXPANDER AND FLEX HD (ACELLULAR               HYDRATED DERMIS);  Surgeon: Peggye Form, DO;                Location: [facility];  Service: Plastics;  Laterality:  Bilateral; 03/26/2023: BREAST RECONSTRUCTION WITH PLACEMENT OF TISSUE EXPANDER  AND FLEX HD (ACELLULAR HYDRATED DERMIS); Left     Comment:  Procedure: BREAST RECONSTRUCTION WITH PLACEMENT OF               TISSUE EXPANDER;  Surgeon: Peggye Form, DO;                Location: [facility];  Service: Plastics;               Laterality: Left; No date: CHOLECYSTECTOMY 06/27/2021: COLONOSCOPY; N/A     Comment:  Procedure: COLONOSCOPY;  Surgeon: Jaynie Collins,              DO;  Location: [facility];  Service:               Gastroenterology;  Laterality: N/A; No date: DIAGNOSTIC LAPAROSCOPY     Comment:  LOA 06/27/2021: ESOPHAGOGASTRODUODENOSCOPY; N/A     Comment:  Procedure: ESOPHAGOGASTRODUODENOSCOPY (EGD);  Surgeon:                Jaynie Collins, DO;  Location: [facility];                Service: Gastroenterology;  Laterality: N/A; 07/25/2017: ESOPHAGOGASTRODUODENOSCOPY (EGD) WITH PROPOFOL; N/A     Comment:  Procedure: ESOPHAGOGASTRODUODENOSCOPY (EGD) WITH               PROPOFOL;  Surgeon: Scot Jun, MD;  Location:               [facility] ENDOSCOPY;  Service: Endoscopy;  Laterality: N/A; No date: FLEXIBLE BRONCHOSCOPY No date: HERNIA REPAIR No date: KNEE ARTHROSCOPY No date: LAPAROSCOPIC GASTRIC SLEEVE RESECTION WITH HIATAL HERNIA  REPAIR 08/29/2022: LUMBAR FUSION     Comment:  Revision Lumbar two through five Laminectomy & Fusion               with Transforaminal Lumbar interbody fusion 01/18/2023: MASTECTOMY W/ SENTINEL NODE BIOPSY; Bilateral     Comment:  Procedure: MASTECTOMY WITH SENTINEL LYMPH NODE BIOPSY,               RNFA to assist;  Surgeon: Leafy Ro, MD;  Location:               [facility];  Service: General;  Laterality: Bilateral; 06/06/2023: REMOVAL OF TISSUE EXPANDER AND PLACEMENT OF IMPLANT;  Bilateral     Comment:  Procedure: REMOVAL OF TISSUE EXPANDER AND PLACEMENT OF               IMPLANT;  Surgeon: Peggye Form, DO;  Location:               [facility];  Service: Plastics;                Laterality: Bilateral; No date: SPINAL CORD STIMULATOR IMPLANT No date: TONSILLECTOMY 01/26/2016: TOTAL HIP ARTHROPLASTY; Left     Comment:  Procedure: LEFT TOTAL HIP ARTHROPLASTY ANTERIOR               APPROACH;  Surgeon: Tarry Kos, MD;  Location: [facility];                Service: Orthopedics;  Laterality: Left; 08/2019: TRANSFORAMINAL LUMBAR INTERBODY FUSION (TLIF) WITH PEDICLE  SCREW FIXATION 3 LEVEL     Comment:  Vira Browns, MD L2-L5  BMI    Body Mass Index: 42.91 kg/m      Reproductive/Obstetrics negative OB  ROS                             Anesthesia Physical Anesthesia Plan  ASA: 3  Anesthesia Plan: General   Post-op  Pain Management:    Induction: Intravenous  PONV Risk Score and Plan: Propofol infusion and TIVA  Airway Management Planned: Natural Airway and Nasal Cannula  Additional Equipment:   Intra-op Plan:   Post-operative Plan:   Informed Consent: I have reviewed the patients History and Physical, chart, labs and discussed the procedure including the risks, benefits and alternatives for the proposed anesthesia with the patient or authorized representative who has indicated his/her understanding and acceptance.     Dental Advisory Given  Plan Discussed with: Anesthesiologist, CRNA and Surgeon  Anesthesia Plan Comments: (Patient consented for risks of anesthesia including but not limited to:  - adverse reactions to medications - risk of airway placement if required - damage to eyes, teeth, lips or other oral mucosa - nerve damage due to positioning  - sore throat or hoarseness - Damage to heart, brain, nerves, lungs, other parts of body or loss of life  Patient voiced understanding and assent.)       Anesthesia Quick Evaluation

## 2023-08-27 NOTE — Anesthesia Postprocedure Evaluation (Signed)
Anesthesia Post Note  Patient: Vanessa Medina  Procedure(s) Performed: COLONOSCOPY  Patient location during evaluation: Endoscopy Anesthesia Type: General Level of consciousness: awake and alert Pain management: pain level controlled Vital Signs Assessment: post-procedure vital signs reviewed and stable Respiratory status: spontaneous breathing, nonlabored ventilation, respiratory function stable and patient connected to nasal cannula oxygen Cardiovascular status: blood pressure returned to baseline and stable Postop Assessment: no apparent nausea or vomiting Anesthetic complications: no   No notable events documented.   Last Vitals:  Vitals:   08/27/23 0815 08/27/23 0827  BP: (!) 99/54 116/69  Pulse: 67   Resp: (!) 23 16  Temp:    SpO2: 100%     Last Pain:  Vitals:   08/27/23 0827  TempSrc:   PainSc: 0-No pain                 Cleda Mccreedy Jamine Wingate

## 2023-08-27 NOTE — Transfer of Care (Signed)
Immediate Anesthesia Transfer of Care Note  Patient: Vanessa Medina  Procedure(s) Performed: COLONOSCOPY  Patient Location: PACU  Anesthesia Type:General  Level of Consciousness: unresponsive  Airway & Oxygen Therapy: Patient Spontanous Breathing  Post-op Assessment: Report given to RN and Post -op Vital signs reviewed and stable  Post vital signs: Reviewed and stable  Last Vitals:  Vitals Value Taken Time  BP 93/48 08/27/23 0805  Temp 35.6 C 08/27/23 0803  Pulse 60 08/27/23 0805  Resp 22 08/27/23 0805  SpO2 97 % 08/27/23 0805  Vitals shown include unfiled device data.  Last Pain:  Vitals:   08/27/23 0803  TempSrc: Temporal  PainSc: Asleep         Complications: No notable events documented.

## 2023-08-27 NOTE — Op Note (Signed)
[facility] Patient Name: Vanessa Medina Procedure Date: 08/27/2023 7:36 AM MRN: [id number] Account #: [id number] Date of Birth: February 20, 1971 Admit Type: Outpatient Age: 52 Room: [facility] ENDO ROOM 2 Gender: Female Note Status: Finalized Instrument Name: Colonoscope [id number] Procedure:             Colonoscopy Indications:           Screening for colorectal malignant neoplasm Providers:             Trenda Moots, DO Referring MD:          Rhona Leavens. Burnett Sheng, MD (Referring MD) Medicines:             Monitored Anesthesia Care Complications:         No immediate complications. Estimated blood loss: None. Procedure:             Pre-Anesthesia Assessment:                        - Prior to the procedure, a History and Physical was                         performed, and patient medications and allergies were                         reviewed. The patient is competent. The risks and                         benefits of the procedure and the sedation options and                         risks were discussed with the patient. All questions                         were answered and informed consent was obtained.                         Patient identification and proposed procedure were                         verified by the physician, the nurse, the anesthetist                         and the technician in the endoscopy suite. Mental                         Status Examination: alert and oriented. Airway                         Examination: normal oropharyngeal airway and neck                         mobility. Respiratory Examination: clear to                         auscultation. CV Examination: RRR, no murmurs, no S3                         or S4. Prophylactic Antibiotics: The patient does not  require prophylactic antibiotics. Prior                         Anticoagulants: The patient has taken no anticoagulant                          or antiplatelet agents. ASA Grade Assessment: III - A                         patient with severe systemic disease. After reviewing                         the risks and benefits, the patient was deemed in                         satisfactory condition to undergo the procedure. The                         anesthesia plan was to use monitored anesthesia care                         (MAC). Immediately prior to administration of                         medications, the patient was re-assessed for adequacy                         to receive sedatives. The heart rate, respiratory                         rate, oxygen saturations, blood pressure, adequacy of                         pulmonary ventilation, and response to care were                         monitored throughout the procedure. The physical                         status of the patient was re-assessed after the                         procedure.                        After obtaining informed consent, the colonoscope was                         passed under direct vision. Throughout the procedure,                         the patient's blood pressure, pulse, and oxygen                         saturations were monitored continuously. The                         Colonoscope was introduced through the anus and  advanced to the the cecum, identified by appendiceal                         orifice and ileocecal valve. The colonoscopy was                         performed without difficulty. The patient tolerated                         the procedure well. The quality of the bowel                         preparation was evaluated using the BBPS [facility] Bowel                         Preparation Scale) with scores of: Right Colon = 2                         (minor amount of residual staining, small fragments of                         stool and/or opaque liquid, but mucosa seen well),                         Transverse  Colon = 2 (minor amount of residual                         staining, small fragments of stool and/or opaque                         liquid, but mucosa seen well) and Left Colon = 2                         (minor amount of residual staining, small fragments of                         stool and/or opaque liquid, but mucosa seen well). The                         total BBPS score equals 6. The quality of the bowel                         preparation was good. The ileocecal valve, appendiceal                         orifice, and rectum were photographed. Findings:      Multiple small-mouthed diverticula were found in the left colon.       Estimated blood loss: none.      Non-bleeding internal hemorrhoids were found during retroflexion. The       hemorrhoids were Grade I (internal hemorrhoids that do not prolapse).       Estimated blood loss: none.      The entire examined colon appeared normal on direct and retroflexion       views. Impression:            - Diverticulosis in the left colon.                        -  Non-bleeding internal hemorrhoids.                        - The entire examined colon is normal on direct and                         retroflexion views.                        - No specimens collected. Recommendation:        - Patient has a contact number available for                         emergencies. The signs and symptoms of potential                         delayed complications were discussed with the patient.                         Return to normal activities tomorrow. Written                         discharge instructions were provided to the patient.                        - Discharge patient to home.                        - Resume previous diet.                        - Continue present medications.                        - Repeat colonoscopy 3-5 years for screening purposes.                        - Recommend two day prep for future colonoscopies as                          this is her second colonoscopy that required a fair                         amount of washing.                        - Return to GI office at appointment to be scheduled.                        - Discuss fecal leakage and possible initiation of                         bile acid sequestrang (for bulking) and anorectal                         manometry. Would benefit from pelvic floor therapy.                        - The findings and recommendations were discussed with  the patient. Procedure Code(s):     --- Professional ---                        [phone], Colonoscopy, flexible; diagnostic, including                         collection of specimen(s) by brushing or washing, when                         performed (separate procedure) Diagnosis Code(s):     --- Professional ---                        Z12.11, Encounter for screening for malignant neoplasm                         of colon                        K64.0, First degree hemorrhoids                        K57.30, Diverticulosis of large intestine without                         perforation or abscess without bleeding CPT copyright 2022 [employer]. All rights reserved. The codes documented in this report are preliminary and upon coder review may  be revised to meet current compliance requirements. Attending Participation:      I personally performed the entire procedure. Elfredia Nevins, DO Jaynie Collins DO, DO 08/27/2023 8:08:26 AM This report has been signed electronically. Number of Addenda: 0 Note Initiated On: 08/27/2023 7:36 AM Scope Withdrawal Time: 0 hours 11 minutes 27 seconds  Total Procedure Duration: 0 hours 17 minutes 52 seconds  Estimated Blood Loss:  Estimated blood loss: none.      [facility]

## 2023-08-30 ENCOUNTER — Encounter: Payer: Self-pay | Admitting: Gastroenterology

## 2023-09-04 ENCOUNTER — Encounter: Payer: Self-pay | Admitting: Plastic Surgery

## 2023-09-04 ENCOUNTER — Ambulatory Visit (INDEPENDENT_AMBULATORY_CARE_PROVIDER_SITE_OTHER): Payer: Medicaid Other | Admitting: Plastic Surgery

## 2023-09-04 ENCOUNTER — Telehealth: Payer: Self-pay | Admitting: Plastic Surgery

## 2023-09-04 ENCOUNTER — Other Ambulatory Visit (HOSPITAL_COMMUNITY)
Admission: RE | Admit: 2023-09-04 | Discharge: 2023-09-04 | Disposition: A | Discharge status: Home / Self Care | Payer: Medicaid Other | Source: Ambulatory Visit | Attending: Plastic Surgery | Admitting: Plastic Surgery

## 2023-09-04 ENCOUNTER — Ambulatory Visit: Payer: Medicaid Other | Admitting: Plastic Surgery

## 2023-09-04 VITALS — BP 137/78 | HR 75 | Ht 64.0 in

## 2023-09-04 DIAGNOSIS — L989 Disorder of the skin and subcutaneous tissue, unspecified: Secondary | ICD-10-CM | POA: Insufficient documentation

## 2023-09-04 DIAGNOSIS — L308 Other specified dermatitis: Secondary | ICD-10-CM | POA: Diagnosis not present

## 2023-09-04 NOTE — Progress Notes (Addendum)
 Procedure Note  Preoperative Dx: Changing skin lesion left breast  Postoperative Dx: Same  Procedure: Excision changing skin lesion left breast 1.8 cm  Anesthesia: Lidocaine  1% with 1:100,000 epinephrine    Description of Procedure: Risks and complications were explained to the patient.  Consent was confirmed and the patient understands the risks and benefits.  The potential complications and alternatives were explained and the patient consents.  The patient expressed understanding the option of not having the procedure and the risks of a scar.  Time out was called and all information was confirmed to be correct.    The area was prepped and drapped.  Lidocaine  1% with epinephrine  was injected in the subcutaneous area.  After waiting several minutes for the local to take affect a #15 blade was used to excise the area.  The skin edges were reapproximated with 6-0 Monocryl.  A dressing was applied.  The patient was given instructions on how to care for the area and a follow up appointment.  Vanessa Medina tolerated the procedure well and there were no complications. The specimen was sent to pathology.

## 2023-09-04 NOTE — Telephone Encounter (Signed)
Called to see if she could come in at 1215 to be numbed, lvmail waiting on call back

## 2023-09-07 ENCOUNTER — Inpatient Hospital Stay (HOSPITAL_BASED_OUTPATIENT_CLINIC_OR_DEPARTMENT_OTHER): Payer: Medicaid Other | Admitting: Internal Medicine

## 2023-09-07 ENCOUNTER — Inpatient Hospital Stay: Payer: Medicaid Other | Attending: Internal Medicine

## 2023-09-07 ENCOUNTER — Encounter: Payer: Self-pay | Admitting: Internal Medicine

## 2023-09-07 ENCOUNTER — Telehealth: Payer: Self-pay | Admitting: Physician Assistant

## 2023-09-07 VITALS — BP 128/87 | HR 76 | Temp 97.3°F | Resp 18 | Wt 253.0 lb

## 2023-09-07 DIAGNOSIS — Z8541 Personal history of malignant neoplasm of cervix uteri: Secondary | ICD-10-CM | POA: Diagnosis not present

## 2023-09-07 DIAGNOSIS — R2681 Unsteadiness on feet: Secondary | ICD-10-CM | POA: Insufficient documentation

## 2023-09-07 DIAGNOSIS — Z170 Estrogen receptor positive status [ER+]: Secondary | ICD-10-CM

## 2023-09-07 DIAGNOSIS — M6281 Muscle weakness (generalized): Secondary | ICD-10-CM | POA: Diagnosis not present

## 2023-09-07 DIAGNOSIS — C50812 Malignant neoplasm of overlapping sites of left female breast: Secondary | ICD-10-CM

## 2023-09-07 DIAGNOSIS — M25512 Pain in left shoulder: Secondary | ICD-10-CM | POA: Insufficient documentation

## 2023-09-07 DIAGNOSIS — Z79811 Long term (current) use of aromatase inhibitors: Secondary | ICD-10-CM | POA: Diagnosis not present

## 2023-09-07 DIAGNOSIS — Z9884 Bariatric surgery status: Secondary | ICD-10-CM | POA: Diagnosis not present

## 2023-09-07 DIAGNOSIS — Z9189 Other specified personal risk factors, not elsewhere classified: Secondary | ICD-10-CM | POA: Diagnosis not present

## 2023-09-07 DIAGNOSIS — Z9013 Acquired absence of bilateral breasts and nipples: Secondary | ICD-10-CM | POA: Insufficient documentation

## 2023-09-07 LAB — CBC WITH DIFFERENTIAL/PLATELET
Abs Immature Granulocytes: 0.01 10*3/uL (ref 0.00–0.07)
Basophils Absolute: 0 10*3/uL (ref 0.0–0.1)
Basophils Relative: 1 %
Eosinophils Absolute: 0.1 10*3/uL (ref 0.0–0.5)
Eosinophils Relative: 2 %
HCT: 38.5 % (ref 36.0–46.0)
Hemoglobin: 12.3 g/dL (ref 12.0–15.0)
Immature Granulocytes: 0 %
Lymphocytes Relative: 28 %
Lymphs Abs: 1 10*3/uL (ref 0.7–4.0)
MCH: 31.8 pg (ref 26.0–34.0)
MCHC: 31.9 g/dL (ref 30.0–36.0)
MCV: 99.5 fL (ref 80.0–100.0)
Monocytes Absolute: 0.3 10*3/uL (ref 0.1–1.0)
Monocytes Relative: 7 %
Neutro Abs: 2.2 10*3/uL (ref 1.7–7.7)
Neutrophils Relative %: 62 %
Platelets: 273 10*3/uL (ref 150–400)
RBC: 3.87 MIL/uL (ref 3.87–5.11)
RDW: 13.1 % (ref 11.5–15.5)
WBC: 3.5 10*3/uL — ABNORMAL LOW (ref 4.0–10.5)
nRBC: 0 % (ref 0.0–0.2)

## 2023-09-07 LAB — COMPREHENSIVE METABOLIC PANEL
ALT: 25 U/L (ref 0–44)
AST: 26 U/L (ref 15–41)
Albumin: 3.6 g/dL (ref 3.5–5.0)
Alkaline Phosphatase: 88 U/L (ref 38–126)
Anion gap: 7 (ref 5–15)
BUN: 16 mg/dL (ref 6–20)
CO2: 22 mmol/L (ref 22–32)
Calcium: 8.9 mg/dL (ref 8.9–10.3)
Chloride: 111 mmol/L (ref 98–111)
Creatinine, Ser: 0.94 mg/dL (ref 0.44–1.00)
GFR, Estimated: >60 mL/min (ref >60)
Glucose, Bld: 102 mg/dL — ABNORMAL HIGH (ref 70–99)
Potassium: 3.8 mmol/L (ref 3.5–5.1)
Sodium: 140 mmol/L (ref 135–145)
Total Bilirubin: 0.5 mg/dL (ref 0.0–1.2)
Total Protein: 7.1 g/dL (ref 6.5–8.1)

## 2023-09-07 MED ORDER — TOLTERODINE TARTRATE ER 4 MG PO CP24: 4.0000 mg | ORAL_CAPSULE | Freq: Every day | ORAL | 0 refills | Status: DC

## 2023-09-07 MED ORDER — LETROZOLE 2.5 MG PO TABS: 2.5000 mg | ORAL_TABLET | Freq: Every day | ORAL | 2 refills | Status: AC

## 2023-09-07 NOTE — Progress Notes (Signed)
 [facility] CONSULT NOTE  Patient Care Team: Valora Agent, MD as PCP - General (Family Medicine) Perla, Evalene PARAS, MD as PCP - Cardiology (Cardiology) Perla Evalene PARAS, MD as Consulting Physician (Cardiology) [name], DO as Consulting Physician (Neurology) [name], RN as Oncology Nurse Navigator Clista Bimler, MD as Consulting Physician (Oncology)  CANCER STAGING   Cancer Staging  Breast cancer [facility]) Staging form: Breast, AJCC 8th Edition - Clinical stage from 12/06/2022: Stage IA (cT1b, cN0, cM0, G2, ER+, PR+, HER2-) - Signed by Ranessa Kosta, MD on 02/15/2023 Stage prefix: Initial diagnosis Histologic grading system: 3 grade system   ASSESSMENT & PLAN:  Vanessa Medina 53 y.o. female with pmh of Right-sided hemiparesis as complication from permanent spinal stimulator placement, orthostasis on midodrine  and fludrocortisone , hypertension, migraine, GERD, cholecystectomy, lumbar fusion in December 2023, gastric sleeve was referred to medical oncology for newly diagnosed left breast hormone positive cancer.  # Bilateral lower extremity swelling -Started 1 week ago. -Considering patient is on tamoxifen , I will obtain venous Dopplers to rule out blood clot.  Patient denies any shortness of breath or pleuritic chest pain.  Until Dopplers, I have requested patient to hold off on tamoxifen .  Continue with leg elevation.  If Dopplers come back negative, can consider compression stockings.  She has mild renal insufficiency and follows with Dr. Dennise.  Denies any cardiac history.  CBC and CMP today.  # Left breast IDC, multifocal, ER/PR positive, HER2 negative -The mass was detected on screening mammogram.  Imaging showing 3 masses. Mass 1 at 1:00 3 cm from the nipple measuring 7 x by 8 x 8 mm.  Mass 2 1:30 o'clock 1 cm from nipple measuring 9 x 7 x 9 mm.  Mass 3 at 2:00 1 cm from nipple measuring 6 x 8 x 4 mm.  No suspicious left axillary lymph node 1.  Mass 1 and  mass 3 was biopsied consistent with invasive ductal cancer.  Both are strongly expressing ER and PR receptors.  HER2 negative.  -Status post bilateral mastectomies with SLNB left axilla and immediate bilateral reconstruction on 01/18/2023.  Pathology showed multifocal disease 3 separate foci- 12 mm with grade 1, 12 mm with grade 2 and 9 mm with grade 2 invasive mucinous. margins negative, 1/2 LN micrometastatic dz, 0.6 mm deposit, ER/PR >90% positive. HER2 negative.   -Oncotype DX score was 9.  No role for adjuvant chemotherapy. -No role for adjuvant radiation.  -Patient decided to proceed with tamoxifen  over aromatase inhibitor due to a side effect of osteoporosis.  On tamoxifen  from 03/02/2023 to 08/07/2023.  Discontinued due to severe bilateral lower extremity edema.  Switched to letrozole  2.5 mg daily which she is tolerating well.  Will obtain DEXA scan as baseline bone health.  Continue with calcium  vitamin D supplements.  She is back on full dose of duloxetine  now that we have discontinued tamoxifen .  Has not had lipid panel checked.  Will need cholesterol monitoring on letrozole .  -She had multiple issues with tissue expanders.  Now has implant placed.  Follows with Dr. Lowery.  # Right apical nodule -Detected on breast cancer staging.  CT chest without contrast showed 4 mm right apical nodule.   -Repeat CT scan showed stable right apical nodule likely benign calcified granuloma.  No further follow-up is recommended.  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  # Extreme fatigue -Iron  panel and TSH was normal.  #  Right-sided hemiparesis -Due to acute spinal cord injury during spinal stimulator placement.  MRI brain from October 2023 with no evidence of stroke  # Chronic back pain status post lumbar fusion -On duloxetine , muscle relaxers  # Orthostasis -Chronic.  On midodrine  and  fludrocortisone   #s/p gastric sleeve procedure for weight loss  # Personal history of cervical cancer s/p cryotherapy.  Tells me gets regular Pap smears.   Orders Placed This Encounter  Procedures   DG Bone Density    Standing Status:   Future    Expected Date:   09/14/2023    Expiration Date:   09/06/2024    Reason for Exam (SYMPTOM  OR DIAGNOSIS REQUIRED):   on letrozole     Is patient pregnant?:   No    Preferred imaging location?:   [facility]   CBC with Differential/Platelet    Standing Status:   Future    Expected Date:   03/06/2024    Expiration Date:   09/06/2024   CMP (Cancer Center only)    Standing Status:   Future    Expected Date:   03/06/2024    Expiration Date:   09/06/2024   RTC in 6 months for MD visit, lab  The total time spent in the appointment was 30 minutes encounter with patients including review of chart and various tests results, discussions about plan of care and coordination of care plan   All questions were answered. The patient knows to call the clinic with any problems, questions or concerns. No barriers to learning was detected.  Genevia Rous, MD 1/3/202511:58 AM   HISTORY OF PRESENTING ILLNESS:  Vanessa Medina 53 y.o. female with pmh of Right-sided hemiparesis as complication from permanent spinal stimulator placement, orthostasis on midodrine  and fludrocortisone , hypertension, migraine, GERD, cholecystectomy, lumbar fusion in December 2023, gastric sleeve was referred to medical oncology for newly diagnosed left breast hormone positive cancer.  Interval history Patient seen today as follow-up for letrozole  toxicity check and labs. Bilateral lower extremity swelling has completely resolved after switching from tamoxifen  to letrozole .  Continues to have lower back pain after a fall but no fractures on x-ray.  She has established care with new neurosurgeons and they have discussed about spinal stimulator which may be planned for February.  Did have  a small spot from the left breast removed by Dr. Lowery.  Pathology is pending.  Does report some occasional dull ache in the middle of the chest started about 2 weeks ago.  Otherwise she has been doing well.  I have reviewed her chart and materials related to her cancer extensively and collaborated history with the patient. Summary of oncologic history is as follows: Oncology History  Breast cancer (HCC)  11/23/2022 Mammogram   Screening mammogram-   FINDINGS: In the left breast, possible masses warrant further evaluation. In the right breast, no findings suspicious for malignancy.  Diagnostic mammogram and US  (11/29/2022) Mass 1:   At 1 o'clock 3 cm from the nipple, there is an irregular hypoechoic mass with irregular margins. There is associated sonographic architectural distortion. It measures approximately 8 x 7 by 8 mm.   Mass 2:   At 1:30 1 cm from the nipple, there is an oval mildly hypoechoic mass with irregular margins. It measures 9 x 7 by 9 mm.   Mass 3:   At 2 o'clock 1 cm from the nipple, there is an oval hypoechoic mass with mildly angular margins. It measures 6 by 8 x 4 mm.  There is approximately 2.1 cm between mass 1 and mass 3. Masses 1-3 span approximately 3.3 cm.   Targeted ultrasound was performed of the LEFT axilla. No suspicious axillary lymph nodes are visualized.   IMPRESSION: 1. There are 3 masses which extend along a ductal ray of the LEFT breast. One of these masses (mass 1) is a highly suspicious 8 mm mass at the site of screening mammographic concern at 1 o'clock 3 cm from the nipple with associated architectural distortion. The 2 other masses extend anteriorly from mass 1 towards the nipple and span in total 3.7 cm. Recommend 2 site ultrasound-guided biopsy for definitive characterization with preferential sampling of mass 1.  2. No suspicious LEFT axillary adenopathy.   12/06/2022 Initial Biopsy   DIAGNOSIS: A. BREAST, LEFT AT 1:00, 3  CM FROM THE NIPPLE; ULTRASOUND-GUIDED CORE NEEDLE BIOPSY: - INVASIVE MAMMARY CARCINOMA, NO SPECIAL TYPE.  Size of invasive carcinoma: 12 mm in this sample Histologic grade of invasive carcinoma: Grade 1                      Glandular/tubular differentiation score: 2                      Nuclear pleomorphism score: 2                      Mitotic rate score: 1                      Total score: 5 Ductal carcinoma in situ: Present, intermediate grade Lymphovascular invasion: Not identified  LEFT AT 1:00, 3 CM FN;  HEART-SHAPED CLIP  Estrogen Receptor (ER) Status: POSITIVE          Percentage of cells with nuclear positivity: Greater than 90%          Average intensity of staining: Strong   Progesterone Receptor (PgR) Status: POSITIVE          Percentage of cells with nuclear positivity: Greater than 90%          Average intensity of staining: Strong   HER2 (by immunohistochemistry): NEGATIVE (Score 1+)   B. BREAST, LEFT AT 2:00, 1 CM FROM THE NIPPLE; ULTRASOUND-GUIDED CORE NEEDLE BIOPSY: - INVASIVE MAMMARY CARCINOMA, WITH MUCINOUS FEATURES.  Estrogen Receptor (ER) Status: POSITIVE          Percentage of cells with nuclear positivity: Greater than 90%          Average intensity of staining: Strong   Progesterone Receptor (PgR) Status: POSITIVE          Percentage of cells with nuclear positivity: Greater than 90%          Average intensity of staining: Strong   HER2 (by immunohistochemistry): NEGATIVE (Score 0)  Size of invasive carcinoma: 4 mm in this sample Histologic grade of invasive carcinoma: Grade 2                      Glandular/tubular differentiation score: 3                      Nuclear pleomorphism score: 2                      Mitotic rate score: 1                      Total score: 6 Ductal  carcinoma in situ: Not identified Lymphovascular invasion: Not identified     12/07/2022 Cancer Staging   Staging form: Breast, AJCC 8th Edition - Clinical stage from  12/06/2022: Stage IA (cT1b, cN0, cM0, G2, ER+, PR+, HER2-) - Signed by Tresha Muzio, MD on 12/08/2022 Stage prefix: Initial diagnosis Histologic grading system: 3 grade system     Genetic Testing   No pathogenic variants identified on the [facility] Hereditary Breast Cancer STAT+ Multi-Cancer+RNA panel. VUS in POLD1 called c.964C>T identified. The report date is 12/15/2022.  The Multi-Cancer + RNA Panel offered by [facility] includes sequencing and/or deletion/duplication analysis of the following 70 genes:  AIP*, ALK, APC*, ATM*, AXIN2*, BAP1*, BARD1*, BLM*, BMPR1A*, BRCA1*, BRCA2*, BRIP1*, CDC73*, CDH1*, CDK4, CDKN1B*, CDKN2A, CHEK2*, CTNNA1*, DICER1*, EPCAM, EGFR, FH*, FLCN*, GREM1, HOXB13, KIT, LZTR1, MAX*, MBD4, MEN1*, MET, MITF, MLH1*, MSH2*, MSH3*, MSH6*, MUTYH*, NF1*, NF2*, NTHL1*, PALB2*, PDGFRA, PMS2*, POLD1*, POLE*, POT1*, PRKAR1A*, PTCH1*, PTEN*, RAD51C*, RAD51D*, RB1*, RET, SDHA*, SDHAF2*, SDHB*, SDHC*, SDHD*, SMAD4*, SMARCA4*, SMARCB1*, SMARCE1*, STK11*, SUFU*, TMEM127*, TP53*, TSC1*, TSC2*, VHL*. RNA analysis is performed for * genes.   01/18/2023 Definitive Surgery   Bilateral mastectomy with left axilla SLNB and immediate breast reconstruction by Dr. Jordis and Dr. Lowery.  DIAGNOSIS: A.  BREAST, LEFT; MASTECTOMY: - INVASIVE MAMMARY CARCINOMA, MULTIFOCAL. - DUCTAL CARCINOMA IN SITU (DCIS). - SEE CANCER SUMMARY BELOW. - TWO BIOPSY SITES WITH ASSOCIATED CLIPS. - UNREMARKABLE SKIN AND NIPPLE.  B.  BREAST, RIGHT; MASTECTOMY: - BENIGN BREAST TISSUE. - UNREMARKABLE SKIN AND NIPPLE. - NEGATIVE FOR ATYPIA AND MALIGNANCY.  C.  SENTINEL LYMPH NODES, LEFT AXILLARY 1 AND 2; EXCISION: - MICROMETASTATIC CARCINOMA INVOLVES ONE OF TWO LYMPH NODES (1/2).  CANCER CASE SUMMARY: INVASIVE CARCINOMA OF THE BREAST Standard(s): AJCC-UICC 8  SPECIMEN Procedure: Mastectomy Specimen Laterality: Left  TUMOR Histologic Type: Mucinous carcinoma Histologic Grade (Nottingham Histologic Score)       Glandular (Acinar)/Tubular Differentiation: 3      Nuclear Pleomorphism: 2      Mitotic Rate: 1      Overall Grade: 2 Tumor Size: 12 mm Tumor Focality: Multiple foci of invasive carcinoma (3 separate, see comment) Ductal Carcinoma In Situ (DCIS): Present, intermediate grade Tumor Extent: Not applicable Lymphatic and/or Vascular Invasion: Present Treatment Effect in the Breast: No known presurgical therapy  MARGINS Margin Status for Invasive Carcinoma: All margins negative for invasive carcinoma      Distance from closest margin: 10 mm      Specify closest margin: Anterior  Margin Status for DCIS: All margins negative for DCIS      Distance from DCIS to closest margin: 10 mm      Specify closest margin: Anterior  REGIONAL LYMPH NODES Regional Lymph Node Status: Tumor present in regional lymph node(s)      Number of Lymph Nodes with Macrometastases (greater than 2 mm): 0      Number of Lymph Nodes with Micrometastases (greater than 0.2 mm to 2 mm and/or greater than 200 cells): 1      Number of Lymph Nodes with Isolated Tumor Cells (0.2 mm or less OR 200 cells or less): 0      Size of Largest Metastatic Deposit: 0.6 mm     Extranodal Extension: Not identified      Total Number of Lymph Nodes Examined (sentinel and non-sentinel): 2      Number of Sentinel Nodes Examined: 2   CASE SUMMARY: BREAST BIOMARKER TESTS - LEFT AT 2:00, 1 CM FN;  RIBBON-SHAPED CLIP  Estrogen Receptor (  ER) Status: POSITIVE          Percentage of cells with nuclear positivity: Greater than 90%          Average intensity of staining: Strong   Progesterone Receptor (PgR) Status: POSITIVE          Percentage of cells with nuclear positivity: Greater than 90%          Average intensity of staining: Strong   HER2 (by immunohistochemistry): NEGATIVE (Score 0)       MEDICAL HISTORY:  Past Medical History:  Diagnosis Date   Acute nontraumatic kidney injury (HCC)    Anemia    Anxiety    Benign cyst of  kidney    Breast cancer (HCC) 01/18/2023   left breast   Cervical cancer (HCC)    Chronic hypotension    Chronic kidney disease    Previous now clear   Claustrophobia    Decompression injury of spinal cord    Depression    Family history of adverse reaction to anesthesia      my mother takes a long time time wake up   History of dysplastic nevus 01/18/2018   right shoulder, recurrent dysplastic nevus   History of kidney stones    History of shingles    Migraine    Multiple allergies    Obesity    Osteoarthritis    left hip   Pneumonia    PONV (postoperative nausea and vomiting)    slow to wake up   Sleep apnea    does not wear CPAP   Slow to wake up after anesthesia    Stroke (HCC) 08/11/2020   spinal cord stroke, spinal cord puncture during surgery   Wears glasses     SURGICAL HISTORY: Past Surgical History:  Procedure Laterality Date   ABDOMINAL HYSTERECTOMY     APPENDECTOMY     BACK SURGERY     BILIOPANCREATIC DIVISION W DUODENAL SWITCH N/A    BREAST BIOPSY Left 12/06/2022   u/s bx,1:00 heart clip, path pending   BREAST BIOPSY Left 12/06/2022   us  bx 2:00 ribbon clip path pending   BREAST BIOPSY Left 12/06/2022   US  LT BREAST BX W LOC DEV EA ADD LESION IMG BX SPEC US  GUIDE 12/06/2022 [facility]   BREAST BIOPSY Left 12/06/2022   US  LT BREAST BX W LOC DEV 1ST LESION IMG BX SPEC US  GUIDE 12/06/2022 [facility]   BREAST RECONSTRUCTION WITH PLACEMENT OF TISSUE EXPANDER AND FLEX HD (ACELLULAR HYDRATED DERMIS) Bilateral 01/18/2023   Procedure: IMMEDIATE LEFT BREAST RECONSTRUCTION WITH PLACEMENT OF TISSUE EXPANDER AND FLEX HD (ACELLULAR HYDRATED DERMIS);  Surgeon: [name], DO;  Location: [facility];  Service: Plastics;  Laterality: Bilateral;   BREAST RECONSTRUCTION WITH PLACEMENT OF TISSUE EXPANDER AND FLEX HD (ACELLULAR HYDRATED DERMIS) Left 03/26/2023   Procedure: BREAST RECONSTRUCTION WITH PLACEMENT OF TISSUE EXPANDER;  Surgeon: [name], DO;  Location: [facility];  Service: Plastics;  Laterality: Left;   CHOLECYSTECTOMY     COLONOSCOPY N/A 06/27/2021   Procedure: COLONOSCOPY;  Surgeon: Onita Elspeth Sharper, DO;  Location: [facility];  Service: Gastroenterology;  Laterality: N/A;   COLONOSCOPY N/A 08/27/2023   Procedure: COLONOSCOPY;  Surgeon: Onita Elspeth Sharper, DO;  Location: [facility]COPY;  Service: Gastroenterology;  Laterality: N/A;   DIAGNOSTIC LAPAROSCOPY     LOA   ESOPHAGOGASTRODUODENOSCOPY N/A 06/27/2021   Procedure: ESOPHAGOGASTRODUODENOSCOPY (EGD);  Surgeon: Onita Elspeth Sharper, DO;  Location: [facility] ENDOSCOPY;  Service: Gastroenterology;  Laterality:  N/A;   ESOPHAGOGASTRODUODENOSCOPY (EGD) WITH PROPOFOL  N/A 07/25/2017   Procedure: ESOPHAGOGASTRODUODENOSCOPY (EGD) WITH PROPOFOL ;  Surgeon: Viktoria Lamar DASEN, MD;  Location: [facility]COPY;  Service: Endoscopy;  Laterality: N/A;   FLEXIBLE BRONCHOSCOPY     HERNIA REPAIR     KNEE ARTHROSCOPY     LAPAROSCOPIC GASTRIC SLEEVE RESECTION WITH HIATAL HERNIA REPAIR     LUMBAR FUSION  08/29/2022   Revision Lumbar two through five Laminectomy & Fusion with Transforaminal Lumbar interbody fusion   MASTECTOMY W/ SENTINEL NODE BIOPSY Bilateral 01/18/2023   Procedure: MASTECTOMY WITH SENTINEL LYMPH NODE BIOPSY, RNFA to assist;  Surgeon: Jordis Laneta FALCON, MD;  Location: [facility];  Service: General;  Laterality: Bilateral;   REMOVAL OF TISSUE EXPANDER AND PLACEMENT OF IMPLANT Bilateral 06/06/2023   Procedure: REMOVAL OF TISSUE EXPANDER AND PLACEMENT OF IMPLANT;  Surgeon: [name], DO;  Location: [facility];  Service: Plastics;  Laterality: Bilateral;   SPINAL CORD STIMULATOR IMPLANT     TONSILLECTOMY     TOTAL HIP ARTHROPLASTY Left 01/26/2016   Procedure: LEFT TOTAL HIP ARTHROPLASTY ANTERIOR APPROACH;  Surgeon: Kay CHRISTELLA Cummins, MD;  Location: [facility];  Service: Orthopedics;  Laterality: Left;   TRANSFORAMINAL LUMBAR INTERBODY FUSION  (TLIF) WITH PEDICLE SCREW FIXATION 3 LEVEL  08/2019   Lynwood Better, MD L2-L5    SOCIAL HISTORY: Social History   Socioeconomic History   Marital status: Divorced    Spouse name: Not on file   Number of children: 1   Years of education: Not on file   Highest education level: Not on file  Occupational History   Not on file  Tobacco Use   Smoking status: Never    Passive exposure: Never   Smokeless tobacco: Never  Vaping Use   Vaping status: Never Used  Substance and Sexual Activity   Alcohol use: Not Currently    Comment: occasional wine   Drug use: No   Sexual activity: Not Currently    Birth control/protection: Surgical    Comment: Hyst  Other Topics Concern   Not on file  Social History Narrative   Right handed    Uses cane to walk    Son has Medical and legal POA.   Social Drivers of Health   Financial Resource Strain: Medium Risk (12/11/2022)   Overall Financial Resource Strain (CARDIA)    Difficulty of Paying Living Expenses: Somewhat hard  Food Insecurity: No Food Insecurity (01/18/2023)   Hunger Vital Sign    Worried About Running Out of Food in the Last Year: Never true    Ran Out of Food in the Last Year: Never true  Recent Concern: Food Insecurity - Food Insecurity Present (12/08/2022)   Hunger Vital Sign    Worried About Running Out of Food in the Last Year: Often true    Ran Out of Food in the Last Year: Often true  Transportation Needs: No Transportation Needs (01/18/2023)   PRAPARE - Administrator, Civil Service (Medical): No    Lack of Transportation (Non-Medical): No  Physical Activity: Inactive (12/11/2022)   Exercise Vital Sign    Days of Exercise per Week: 0 days    Minutes of Exercise per Session: 0 min  Stress: Stress Concern Present (12/11/2022)   [employer] of Occupational Health - Occupational Stress Questionnaire    Feeling of Stress : To some extent  Social Connections: Moderately Isolated (12/11/2022)   Social Connection and  Isolation Panel [NHANES]    Frequency  of Communication with Friends and Family: Three times a week    Frequency of Social Gatherings with Friends and Family: Twice a week    Attends Religious Services: 1 to 4 times per year    Active Member of [employer] or Organizations: No    Attends Banker Meetings: Never    Marital Status: Divorced  Catering Manager Violence: Not At Risk (01/18/2023)   Humiliation, Afraid, Rape, and Kick questionnaire    Fear of Current or Ex-Partner: No    Emotionally Abused: No    Physically Abused: No    Sexually Abused: No    FAMILY HISTORY: Family History  Problem Relation Age of Onset   Renal Disease Mother    Hypertension Mother    Sudden Cardiac Death Mother    Heart failure Mother    Valvular heart disease Mother    Heart disease Mother    Breast cancer Maternal Grandmother    Stroke Brother    Heart attack Brother 27   Diabetes Other     ALLERGIES:  is allergic to bee venom, iodinated contrast media, latex, morphine , shellfish allergy, codeine , meperidine hcl, bee pollen, oxycodone , tape, pentazocine lactate, and propoxyphene.  MEDICATIONS:  Current Outpatient Medications  Medication Sig Dispense Refill   colestipol (COLESTID) 1 g tablet Take by mouth.     tolterodine  (DETROL  LA) 4 MG 24 hr capsule Take 1 capsule (4 mg total) by mouth daily. 90 capsule 0   azelastine (ASTELIN) 0.1 % nasal spray Place 1 spray into both nostrils 2 (two) times daily. Use in each nostril as directed     baclofen  (LIORESAL ) 10 MG tablet Take 1 tablet (10 mg total) by mouth 3 (three) times daily. - for spasms 270 tablet 3   diazepam  (VALIUM ) 2 MG tablet Take 1 tablet (2 mg total) by mouth every 12 (twelve) hours as needed for up to 5 doses for muscle spasms. (Patient not taking: Reported on 08/22/2023) 5 tablet 0   diclofenac  Sodium (VOLTAREN ) 1 % GEL Apply 2 g topically 4 (four) times daily. 50 g 5   DULoxetine  (CYMBALTA ) 60 MG capsule Take 2 capsules (120 mg  total) by mouth at bedtime. 180 capsule 3   EPINEPHrine  0.3 mg/0.3 mL IJ SOAJ injection      fluticasone  (FLONASE ) 50 MCG/ACT nasal spray Place 2 sprays into both nostrils daily.     gabapentin  (NEURONTIN ) 300 MG capsule Take 1 capsule (300 mg total) by mouth at bedtime. For nerve pain 90 capsule 3   letrozole  (FEMARA ) 2.5 MG tablet Take 1 tablet (2.5 mg total) by mouth daily. 90 tablet 2   methocarbamol  (ROBAXIN ) 500 MG tablet Take 500 mg by mouth 4 (four) times daily.     montelukast  (SINGULAIR ) 10 MG tablet TAKE 1 TABLET BY MOUTH AT  BEDTIME FOR ALLERGIES 30 tablet 1   Multiple Vitamin (MULTIVITAMIN WITH MINERALS) TABS tablet Take 1 tablet by mouth 2 (two) times daily.     pramipexole  (MIRAPEX ) 0.125 MG tablet Take by mouth.     pyridOXINE (VITAMIN B-6) 100 MG tablet Take 1 tablet (100 mg total) by mouth daily. 90 tablet 1   thiamine 250 MG tablet Take 250 mg by mouth daily.     topiramate  (TOPAMAX ) 100 MG tablet TAKE 1 TABLET BY MOUTH AT  BEDTIME 90 tablet 3   traMADol  (ULTRAM ) 50 MG tablet Take 1 tablet (50 mg total) by mouth every 12 (twelve) hours as needed. 30 tablet 1   vitamin B-12 (  CYANOCOBALAMIN ) 1000 MCG tablet Take 1 tablet (1,000 mcg total) by mouth daily. 90 tablet 1   No current facility-administered medications for this visit.    REVIEW OF SYSTEMS:   Pertinent information mentioned in HPI All other systems were reviewed with the patient and are negative.  PHYSICAL EXAMINATION: ECOG PERFORMANCE STATUS: 2 - Symptomatic, <50% confined to bed  Vitals:   09/07/23 1051  BP: 128/87  Pulse: 76  Resp: 18  Temp: (!) 97.3 F (36.3 C)  SpO2: 100%   Filed Weights   09/07/23 1051  Weight: 253 lb (114.8 kg)    GENERAL:alert, no distress and comfortable SKIN: skin color, texture, turgor are normal, no rashes or significant lesions EYES: normal, conjunctiva are pink and non-injected, sclera clear OROPHARYNX:no exudate, no erythema and lips, buccal mucosa, and tongue normal   NECK: supple, thyroid  normal size, non-tender, without nodularity LYMPH:  no palpable lymphadenopathy in the cervical, axillary or inguinal LUNGS: clear to auscultation and percussion with normal breathing effort HEART: regular rate & rhythm and no murmurs and no lower extremity edema ABDOMEN:abdomen soft, non-tender and normal bowel sounds Musculoskeletal:no cyanosis of digits and no clubbing  PSYCH: alert & oriented x 3 with fluent speech NEURO: no focal motor/sensory deficits  LABORATORY DATA:  I have reviewed the data as listed Lab Results  Component Value Date   WBC 3.5 (L) 09/07/2023   HGB 12.3 09/07/2023   HCT 38.5 09/07/2023   MCV 99.5 09/07/2023   PLT 273 09/07/2023   Recent Labs    06/26/23 1237 08/06/23 1353 09/07/23 1031  NA 140 142 140  K 3.8 3.6 3.8  CL 113* 111 111  CO2 22 22 22   GLUCOSE 101* 94 102*  BUN 11 11 16   CREATININE 1.14* 0.90 0.94  CALCIUM  8.7* 8.6* 8.9  GFRNONAA 58* >60 >60  PROT 7.1 7.4 7.1  ALBUMIN  3.8 3.9 3.6  AST 21 22 26   ALT 18 21 25   ALKPHOS 87 78 88  BILITOT 0.5 0.6 0.5    RADIOGRAPHIC STUDIES: I have personally reviewed the radiological images as listed and agreed with the findings in the report. No results found.

## 2023-09-07 NOTE — Progress Notes (Signed)
 For the last couple of weeks straight across her chest has been feeling achy constantly there. She had lump removed Tuesday, December 31st from her left side of her chest, they are waiting to hear back from pathology about the results.

## 2023-09-07 NOTE — Telephone Encounter (Signed)
 She has a contrast allergy (iodinated contrast). MRI contrast is different (gadolinium). I spoke with the patient - she had an MRI of her breasts with and without contrast on 12/15/22 and did not premedicate for this. She confirms that she did not have any issues with the contrast at the time. I explained that CT contrast is different than MRI contrast and therefore, she shouldn't need to premedicate for the MRI, especially given that she didn't have any issues with her MRI in April.

## 2023-09-07 NOTE — Telephone Encounter (Signed)
 Mri thoracic w/wo contract is scheduled for 09/11/2023. She was told that she has to prep 13 hours before the MRI because she is allergic to the dye. How does she prep for this?

## 2023-09-10 ENCOUNTER — Encounter: Payer: Medicaid Other | Admitting: Urology

## 2023-09-10 LAB — SURGICAL PATHOLOGY

## 2023-09-11 ENCOUNTER — Ambulatory Visit
Admission: RE | Admit: 2023-09-11 | Discharge: 2023-09-11 | Disposition: A | Discharge status: Home / Self Care | Payer: Medicaid Other | Source: Ambulatory Visit | Attending: Physician Assistant | Admitting: Physician Assistant

## 2023-09-11 DIAGNOSIS — Z87828 Personal history of other (healed) physical injury and trauma: Secondary | ICD-10-CM

## 2023-09-11 DIAGNOSIS — S34109S Unspecified injury to unspecified level of lumbar spinal cord, sequela: Secondary | ICD-10-CM

## 2023-09-11 DIAGNOSIS — C50812 Malignant neoplasm of overlapping sites of left female breast: Secondary | ICD-10-CM | POA: Diagnosis present

## 2023-09-11 DIAGNOSIS — Z170 Estrogen receptor positive status [ER+]: Secondary | ICD-10-CM | POA: Diagnosis present

## 2023-09-11 DIAGNOSIS — R29898 Other symptoms and signs involving the musculoskeletal system: Secondary | ICD-10-CM | POA: Diagnosis present

## 2023-09-11 MED ORDER — GADOBUTROL 1 MMOL/ML IV SOLN
10.0000 mL | Freq: Once | INTRAVENOUS | Status: AC | PRN
  Administered 2023-09-11: 10 mL via INTRAVENOUS

## 2023-09-11 NOTE — Therapy (Signed)
 OUTPATIENT PHYSICAL THERAPY SHOULDER and ORTHO TREATMENT NOTE    Patient Name: Vanessa Medina MRN: [id number] DOB:[birth year], 53 y.o., female Today's Date: 09/12/2023  END OF SESSION:  PT End of Session - 09/12/23 1104     Visit Number 11    Number of Visits 34    Date for PT Re-Evaluation 11/15/23    Progress Note Due on Visit 10    PT Start Time 1103    PT Stop Time 1145    PT Time Calculation (min) 42 min    Equipment Utilized During Treatment Gait belt    Activity Tolerance Patient tolerated treatment well;Patient limited by pain;Patient limited by fatigue    Behavior During Therapy [facility] for tasks assessed/performed                      Past Medical History:  Diagnosis Date   Acute nontraumatic kidney injury (HCC)    Anemia    Anxiety    Benign cyst of kidney    Breast cancer (HCC) 01/18/2023   left breast   Cervical cancer (HCC)    Chronic hypotension    Chronic kidney disease    Previous now clear   Claustrophobia    Decompression injury of spinal cord    Depression    Family history of adverse reaction to anesthesia      my mother takes a long time time wake up   History of dysplastic nevus 01/18/2018   right shoulder, recurrent dysplastic nevus   History of kidney stones    History of shingles    Migraine    Multiple allergies    Obesity    Osteoarthritis    left hip   Pneumonia    PONV (postoperative nausea and vomiting)    slow to wake up   Sleep apnea    does not wear CPAP   Slow to wake up after anesthesia    Stroke (HCC) 08/11/2020   spinal cord stroke, spinal cord puncture during surgery   Wears glasses    Past Surgical History:  Procedure Laterality Date   ABDOMINAL HYSTERECTOMY     APPENDECTOMY     BACK SURGERY     BILIOPANCREATIC DIVISION W DUODENAL SWITCH N/A    BREAST BIOPSY Left 12/06/2022   u/s bx,1:00 heart clip, path pending   BREAST BIOPSY Left 12/06/2022   us  bx 2:00 ribbon clip path pending   BREAST BIOPSY  Left 12/06/2022   US  LT BREAST BX W LOC DEV EA ADD LESION IMG BX SPEC US  GUIDE 12/06/2022 [facility]   BREAST BIOPSY Left 12/06/2022   US  LT BREAST BX W LOC DEV 1ST LESION IMG BX SPEC US  GUIDE 12/06/2022 [facility]   BREAST RECONSTRUCTION WITH PLACEMENT OF TISSUE EXPANDER AND FLEX HD (ACELLULAR HYDRATED DERMIS) Bilateral 01/18/2023   Procedure: IMMEDIATE LEFT BREAST RECONSTRUCTION WITH PLACEMENT OF TISSUE EXPANDER AND FLEX HD (ACELLULAR HYDRATED DERMIS);  Surgeon: [name], DO;  Location: [facility];  Service: Plastics;  Laterality: Bilateral;   BREAST RECONSTRUCTION WITH PLACEMENT OF TISSUE EXPANDER AND FLEX HD (ACELLULAR HYDRATED DERMIS) Left 03/26/2023   Procedure: BREAST RECONSTRUCTION WITH PLACEMENT OF TISSUE EXPANDER;  Surgeon: [name], DO;  Location: [facility];  Service: Plastics;  Laterality: Left;   CHOLECYSTECTOMY     COLONOSCOPY N/A 06/27/2021   Procedure: COLONOSCOPY;  Surgeon: Onita Elspeth Sharper, DO;  Location: [facility] END[facility]COPY;  Service: Gastroenterology;  Laterality: N/A;   COLONOSCOPY N/A 08/27/2023   Procedure:  COLONOSCOPY;  Surgeon: Onita Elspeth Sharper, DO;  Location: [facility] ENDOSCOPY;  Service: Gastroenterology;  Laterality: N/A;   DIAGNOSTIC LAPAROSCOPY     LOA   ESOPHAGOGASTRODUODENOSCOPY N/A 06/27/2021   Procedure: ESOPHAGOGASTRODUODENOSCOPY (EGD);  Surgeon: Onita Elspeth Sharper, DO;  Location: [facility] ENDOSCOPY;  Service: Gastroenterology;  Laterality: N/A;   ESOPHAGOGASTRODUODENOSCOPY (EGD) WITH PROPOFOL  N/A 07/25/2017   Procedure: ESOPHAGOGASTRODUODENOSCOPY (EGD) WITH PROPOFOL ;  Surgeon: Viktoria Lamar DASEN, MD;  Location: [facility] ENDOSCOPY;  Service: Endoscopy;  Laterality: N/A;   FLEXIBLE BRONCHOSCOPY     HERNIA REPAIR     KNEE ARTHROSCOPY     LAPAROSCOPIC GASTRIC SLEEVE RESECTION WITH HIATAL HERNIA REPAIR     LUMBAR FUSION  08/29/2022   Revision Lumbar two through five Laminectomy & Fusion with Transforaminal Lumbar  interbody fusion   MASTECTOMY W/ SENTINEL NODE BIOPSY Bilateral 01/18/2023   Procedure: MASTECTOMY WITH SENTINEL LYMPH NODE BIOPSY, RNFA to assist;  Surgeon: Jordis Laneta FALCON, MD;  Location: [facility];  Service: General;  Laterality: Bilateral;   REMOVAL OF TISSUE EXPANDER AND PLACEMENT OF IMPLANT Bilateral 06/06/2023   Procedure: REMOVAL OF TISSUE EXPANDER AND PLACEMENT OF IMPLANT;  Surgeon: [name], DO;  Location: [facility];  Service: Plastics;  Laterality: Bilateral;   SPINAL CORD STIMULATOR IMPLANT     TONSILLECTOMY     TOTAL HIP ARTHROPLASTY Left 01/26/2016   Procedure: LEFT TOTAL HIP ARTHROPLASTY ANTERIOR APPROACH;  Surgeon: Kay CHRISTELLA Cummins, MD;  Location: [facility];  Service: Orthopedics;  Laterality: Left;   TRANSFORAMINAL LUMBAR INTERBODY FUSION (TLIF) WITH PEDICLE SCREW FIXATION 3 LEVEL  08/2019   Lynwood Better, MD L2-L5   Patient Active Problem List   Diagnosis Date Noted   Long term (current) use of aromatase inhibitors 09/07/2023   At risk for loss of bone density 09/07/2023   Changing skin lesion 09/04/2023   Frequent falls 08/22/2023   Chronic pain syndrome 08/22/2023   Bilateral lower extremity edema 08/06/2023   Back pain 08/06/2023   Claustrophobia    Pernicious anemia 03/14/2023   Ruptured left breast implant 02/13/2023   S/P mastectomy, bilateral 01/18/2023   Genetic testing 12/18/2022   Breast cancer (HCC) 12/08/2022   Family history of breast cancer 12/08/2022   Profound fatigue 12/08/2022   Lumbar post-laminectomy syndrome 06/02/2022   Pseudarthrosis after fusion or arthrodesis 05/19/2022   Primary osteoarthritis of right knee 03/28/2022   Subjective tinnitus of both ears 12/21/2021   Abnormality of gait 04/29/2021   Abnormal sensation in both ears 04/06/2021   Other adverse food reactions, not elsewhere classified, subsequent encounter 04/06/2021   Multiple drug allergies 04/06/2021   Insomnia due to medical condition 02/25/2021    Myofascial muscle pain 02/25/2021   Nerve pain 10/18/2020   Incomplete paraplegia (HCC) 10/18/2020   Orthostatic hypotension 10/18/2020   Renal cyst 09/23/2020   Chronic migraine without aura without status migrainosus, not intractable    Hypokalemia    Adjustment reaction with anxiety and depression    Spinal cord injury, lumbar, without spinal bone injury, sequela (HCC) 08/18/2020   Paraparesis (HCC)    Post-operative pain    Hypotension    Slow transit constipation    AKI (acute kidney injury) (HCC)    Right leg weakness 08/11/2020   DDD (degenerative disc disease), lumbar 09/02/2019    Class: Chronic   Degenerative disc disease, lumbar 09/02/2019   Chest tightness 09/23/2018   Bradycardia 09/23/2018   BMI 40.0-44.9, adult (HCC) 04/17/2018   Status post bariatric surgery 04/17/2018  Labile blood pressure 03/08/2018   Chronic low back pain 09/03/2017   History of total hip replacement, left 01/26/2016   Osteoarthritis of spine with radiculopathy, lumbar region 10/16/2014   LEG PAIN, BILATERAL 12/16/2007   Malignant neoplasm of cervix uteri (HCC) 07/02/2007   MORBID OBESITY 07/02/2007   DEPRESSION 07/02/2007   Migraine without aura 07/02/2007   Other allergic rhinitis 07/02/2007   Asthma 07/02/2007   GERD 07/02/2007   ELEVATED BLOOD PRESSURE WITHOUT DIAGNOSIS OF HYPERTENSION 07/02/2007   Asthma 07/02/2007    PCP: Valora Agent MD  REFERRING PROVIDER: Andris Stagger  REFERRING DIAG: s/p breast reconstruction  THERAPY DIAG:  Muscle weakness (generalized)  Difficulty in walking, not elsewhere classified  Unsteadiness on feet  Chronic left shoulder pain  Rationale for Evaluation and Treatment: Rehabilitation  ONSET DATE: December 07 2022  SUBJECTIVE:                                                                                                                                                                                      SUBJECTIVE STATEMENT: Patient  wants to focus on spine today.   Hand dominance: Right  PERTINENT HISTORY: Patient is having LUE ROM issues s/p breast cancer implant reconstruction. Patient underwent bilateral exchange of tissue expanders for implants, bilateral capsulotomies for implant repositioning and right breast capsulectomy with major repositioning of 3 x 8 cm of capsule on 06/06/2023. Patient had back surgery December 26th 2023 and then was diagnosed with breast cancer December 07, 2022. Had surgery Jan 18, 2023 for her breast cancer, had double mastectomy. Patient had two sets of expanders, first one burst, the second one kept flipping and moving. Had to have more work on L side. PMH includes: anemia, anxiety, breast cancer, CKD, claustrophobia, depression, migraines, OA, PONV, sleep apnea, stroke, lumbar fusion, mastectomy.   08/23/2023:  Pt with new orders to address back pain prior to possible surgery for [facility] stimulator. Pt with hx of multiple back surgeries with chronic pain and now worsening pain with decline in mobility. Worst pain reaches 10/10, best 4/10. Pt ideally would like to improve pain. She would also like to improve her ability to walk, reports has not been able to go for walks in a while. Was ambulating without an AD for a while but now must use SPC again. She reports ability to stand, such as to do dishes, is limited due to pain. Pt experiences radiating pain down RLE into R foot and toes.     PAIN:  Are you having pain? Yes: NPRS scale: 4/10 Pain location: L shoulder and breast Pain description: post surgical Aggravating factors: lift something  Relieving factors: none Worst:  7/10 Least: 2/10    PRECAUTIONS: Other: no lifting greater than 1/2 gallon  , pt reports LATEX ALLERGY   RED FLAGS: None   WEIGHT BEARING RESTRICTIONS:  no more than 1/2 gallon  FALLS:  Has patient fallen in last 6 months? Yes. Number of falls 1  LIVING ENVIRONMENT: Lives with: lives with their family Lives in:  House/apartment Stairs: Yes: External: 2 steps; on right going up and on left going up Has following equipment at home: Single point cane, Walker - 2 wheeled, shower chair, and Grab bars  OCCUPATION: Disability   PLOF: Independent with basic ADLs  PATIENT GOALS:to decrease pain in L shoulder;   NEXT MD VISIT:   OBJECTIVE:  Note: Objective measures were completed at Evaluation unless otherwise noted.  DIAGNOSTIC FINDINGS:  N/a   PATIENT SURVEYS:  FOTO 32  COGNITION: Overall cognitive status:  some short term memory      SENSATION: WFL  POSTURE: Rounding shoulders,   UPPER EXTREMITY ROM:   Active ROM Right eval Left eval  Shoulder flexion 115* 90  Shoulder extension 48 41  Shoulder abduction 81* 89  Shoulder adduction    Shoulder internal rotation    Shoulder external rotation Unable to test in testing position Unable to test in testing position  (Blank rows = not tested) *  UPPER EXTREMITY MMT:   LE MMT: 08/23/23 Gross BLE strength is 4-/5, pain-limited RLE   SHOULDER SPECIAL TESTS: Deferred due to limited ROM   JOINT MOBILITY TESTING:  GH: hypomobile AP, PA, inferior glide bilateral   PALPATION:  Muscle guarding subscap, pectoral, glenohumeral region  Significant fluid on L flank    TODAY'S TREATMENT:                                                                                                                                         DATE: 09/12/23  TherEx:  Posterior pelvic tilt 10x Posterior pelvic tilt 10x with adduction ball squeeze   TrA activation 10x 3 second holds  TrA activation with UE raises 10x each side  Single leg march modifications 10x each side Knee abduction to 45 degrees use core to return to neutral 10x;  very challenging RLE due to neuro  Seated trunk flexion stretch with PT overpressure 10x  Manual: Hamstring lengthening stretch 60 seconds each LE Single knee to chest 60 seconds each LE Modified figure four stretch 60  seconds each LE   PATIENT EDUCATION: Education details: goals, plan Person educated: Patient Education method: Explanation, Demonstration, Tactile cues, and Verbal cues Education comprehension: verbalized understanding, returned demonstration, verbal cues required, and tactile cues required  HOME EXERCISE PROGRAM:    PREV Access Code: [email or web address] URL: [email or web address] Date: 08/16/2023 Prepared by: Darryle Patten  Exercises - Standing Shoulder External Rotation with Resistance  - 1 x daily - 5-6 x weekly - 3 sets - 10 reps - Supine Alternating Shoulder Flexion  -  1 x daily - 5-6 x weekly - 3 sets - 10 reps - Seated Shoulder Row with Anchored Resistance  - 1 x daily - 5-6 x weekly - 3 sets - 10 reps   12/2: Access Code: [email or web address] URL: [email or web address] Date: 08/06/2023 Prepared by: Darryle Patten  Exercises - Seated Shoulder Flexion Towel Slide at Table Top  - 1 x daily - 5-7 x weekly - 2 sets - 10 reps - 15-30 sec hold - Seated Shoulder Abduction Towel Slide at Table Top  - 1 x daily - 5-7 x weekly - 2 sets - 10 reps - 15-30 sec hold  11/26: updated Access Code: [email or web address] URL: [email or web address] Date: 07/31/2023 Prepared by: Darryle Patten  Exercises - Isometric Shoulder Flexion at Wall  - 1 x daily - 7 x weekly - 2 sets - 10 reps - Isometric Shoulder Extension at Wall  - 1 x daily - 7 x weekly - 2 sets - 10 reps - Standing Isometric Shoulder Internal Rotation at Doorway  - 1 x daily - 7 x weekly - 2 sets - 10 reps - Standing Isometric Shoulder External Rotation with Doorway  - 1 x daily - 7 x weekly - 2 sets - 10 reps  Access Code: [email or web address] URL: [email or web address] Date: 07/02/2023 Prepared by: Darryle Patten  Exercises - Seated Shoulder Shrugs  - 2 x daily - 6-7 x weekly - 3 sets - 10 reps - Seated Shoulder Shrug Circles AROM Backward  - 2 x daily - 6-7 x weekly - 3 sets - 10 reps - Seated Scapular Retraction   - 1 x daily - 6-7 x weekly - 3 sets - 10 reps  ASSESSMENT:  CLINICAL IMPRESSION: Patient tolerates transition to core and back interventions. She does have aggravation of RLE nerve that improves by end of session. She is eager to progress her functional pain free mobility.  Patient will benefit from skilled physical therapy to improve ROM and strength and decrease pain for improved quality of life.     OBJECTIVE IMPAIRMENTS: Abnormal gait, decreased endurance, decreased mobility, decreased ROM, decreased strength, hypomobility, increased edema, increased fascial restrictions, impaired perceived functional ability, increased muscle spasms, impaired flexibility, impaired UE functional use, improper body mechanics, postural dysfunction, and pain.   ACTIVITY LIMITATIONS: carrying, lifting, bed mobility, bathing, toileting, dressing, self feeding, reach over head, hygiene/grooming, and caring for others  PARTICIPATION LIMITATIONS: meal prep, cleaning, laundry, medication management, shopping, community activity, and yard work  PERSONAL FACTORS: Age, Fitness, Past/current experiences, Time since onset of injury/illness/exacerbation, and 3+ comorbidities: anemia, anxiety, breast cancer, CKD, claustrophobia, depression, migraines, OA, PONV, sleep apnea, stroke, lumbar fusion, mastectomy.   are also affecting patient's functional outcome.   REHAB POTENTIAL: Good  CLINICAL DECISION MAKING: Evolving/moderate complexity  EVALUATION COMPLEXITY: Moderate   GOALS: Goals reviewed with patient? Yes  SHORT TERM GOALS: Target date: 10/04/2023      Patient will be independent in home exercise program to improve strength/mobility for better functional independence with ADLs. Baseline: 10/23: give next session ;12/19: pt indep Goal status: MET    LONG TERM GOALS: Target date: 11/15/2023    Patient will increase FOTO score to equal to or greater than 49    to demonstrate statistically significant  improvement in mobility and quality of life.  Baseline: 32; 08/23/2023: 31 Goal status: INITIAL  2.  Patient will report a worst pain of 3/10 on VAS in L shoulder  to improve tolerance with ADLs and reduced symptoms with activities.  Baseline: 7/10;  12/19: pt reports worst pain is a 6/10, felt in the evenings Goal status: ONGOING  3.  Patient will improve shoulder AROM to > 140 degrees of flexion, scaption, and abduction for improved ability to perform overhead activities. Baseline:12/19: flexion 160 deg; abduction ~140 pain limited Goal status: PARTIALLY MET  4.  Patient will improve shoulder strength to 4/5 for improved functional ADL and iADL performance Baseline: 89/76: unable to perform today due to pain and ROM limitations;12/19: grossly 4-/5  Goal status: ONGOING  5. Patient will increase six minute walk test distance to >1000 for progression to community ambulator and improve gait ability  Baseline: 652 ft with SPC; 12/19: 391 ft with SPC  Goal status: INITIAL  6. Patient will reduce modified Oswestry score to <50 as to demonstrate decreased disability with ADLs including improved sleeping tolerance, walking/sitting tolerance etc for better mobility with ADLs.  Baseline: 78% Goal status: NEW   7. Patient will increase BLE gross strength to 4+/5 as to improve functional strength for independent gait, increased standing tolerance and increased ADL ability. Baseline:Gross BLE strength is 4-/5, pain-limited RLE  Goal status: NEW   PLAN:  PT FREQUENCY: 2x/week  PT DURATION: 12 weeks  PLANNED INTERVENTIONS: 97164- PT Re-evaluation, 97110-Therapeutic exercises, 97530- Therapeutic activity, 97112- Neuromuscular re-education, 97535- Self Care, [id number]- Manual therapy, 97116- Gait training, 97014- Electrical stimulation (unattended), [phone]- Electrical stimulation (manual), [phone]- Traction (mechanical), Patient/Family education, Balance training, Taping, Joint mobilization, Spinal  mobilization, Manual lymph drainage, Scar mobilization, Compression bandaging, Vestibular training, Visual/preceptual remediation/compensation, Cognitive remediation, Cryotherapy, Moist heat, and Biofeedback  PLAN FOR NEXT SESSION:  ROM Of shoulder, isometrics, progress HEP as pt able, endurance, LE strengthening if time permits   Karime Scheuermann, PT 09/12/2023, 11:48 AM

## 2023-09-12 ENCOUNTER — Ambulatory Visit: Payer: Medicaid Other | Attending: Student

## 2023-09-12 DIAGNOSIS — M25612 Stiffness of left shoulder, not elsewhere classified: Secondary | ICD-10-CM | POA: Diagnosis present

## 2023-09-12 DIAGNOSIS — R262 Difficulty in walking, not elsewhere classified: Secondary | ICD-10-CM | POA: Diagnosis present

## 2023-09-12 DIAGNOSIS — R2681 Unsteadiness on feet: Secondary | ICD-10-CM | POA: Diagnosis present

## 2023-09-12 DIAGNOSIS — R278 Other lack of coordination: Secondary | ICD-10-CM | POA: Insufficient documentation

## 2023-09-12 DIAGNOSIS — R2689 Other abnormalities of gait and mobility: Secondary | ICD-10-CM | POA: Insufficient documentation

## 2023-09-12 DIAGNOSIS — M25512 Pain in left shoulder: Secondary | ICD-10-CM | POA: Diagnosis present

## 2023-09-12 DIAGNOSIS — R0789 Other chest pain: Secondary | ICD-10-CM | POA: Insufficient documentation

## 2023-09-12 DIAGNOSIS — G8929 Other chronic pain: Secondary | ICD-10-CM

## 2023-09-12 DIAGNOSIS — M6281 Muscle weakness (generalized): Secondary | ICD-10-CM

## 2023-09-12 DIAGNOSIS — L905 Scar conditions and fibrosis of skin: Secondary | ICD-10-CM | POA: Insufficient documentation

## 2023-09-12 DIAGNOSIS — M5459 Other low back pain: Secondary | ICD-10-CM | POA: Diagnosis present

## 2023-09-12 DIAGNOSIS — M545 Low back pain, unspecified: Secondary | ICD-10-CM | POA: Diagnosis present

## 2023-09-17 ENCOUNTER — Ambulatory Visit (INDEPENDENT_AMBULATORY_CARE_PROVIDER_SITE_OTHER): Payer: Medicaid Other | Admitting: Urology

## 2023-09-17 ENCOUNTER — Encounter: Payer: Self-pay | Admitting: Urology

## 2023-09-17 VITALS — BP 126/77 | HR 73 | Ht 64.0 in | Wt 254.0 lb

## 2023-09-17 DIAGNOSIS — N3946 Mixed incontinence: Secondary | ICD-10-CM

## 2023-09-17 DIAGNOSIS — R300 Dysuria: Secondary | ICD-10-CM

## 2023-09-17 LAB — URINALYSIS, COMPLETE
Bilirubin, UA: NEGATIVE
Glucose, UA: NEGATIVE
Ketones, UA: NEGATIVE
Leukocytes,UA: NEGATIVE
Nitrite, UA: NEGATIVE
Protein,UA: NEGATIVE
RBC, UA: NEGATIVE
Specific Gravity, UA: 1.015 (ref 1.005–1.030)
Urobilinogen, Ur: 0.2 mg/dL (ref 0.2–1.0)
pH, UA: 5.5 (ref 5.0–7.5)

## 2023-09-17 LAB — MICROSCOPIC EXAMINATION
Bacteria, UA: NONE SEEN
RBC, Urine: NONE SEEN /[HPF] (ref 0–2)

## 2023-09-17 NOTE — Progress Notes (Signed)
 09/17/2023 8:27 AM   Vanessa Medina [birth year] [id number]  Referring provider: Valora Agent, MD [address] [facility] [place],  [place] [address]  Chief Complaint  Patient presents with   New Patient (Initial Visit)   Urinary Incontinence    HPI: I was consulted to assess the patient's urinary incontinence.  She has urge incontinence.  She leaks with coughing sneezing.  Both are significant.  She does not wear a pad.  Recently she started having mild bedwetting.  She voids approximately every hour and cannot hold it for 2 hours.  She gets up 2-3 times a night  She has had 3 back operations.  She had a spinal cord stimulator in 2021 and has had surgery before and after.  She said she had a spinal cord injury during that event.  She has sciatica but no loss of perineal sensation.  She thinks your symptoms worsen following  She has had a hysterectomy.  She thinks she gets 10 bladder infections a year primarily with back pain and foul-smelling urine that respond favorably antibiotics.  She is concerned about her family history of bladder cancer  She has had kidney stones.  No bladder surgery.  Bowel movements normal.  Family history of bladder cancer   PMH: Past Medical History:  Diagnosis Date   Acute nontraumatic kidney injury (HCC)    Anemia    Anxiety    Benign cyst of kidney    Breast cancer (HCC) 01/18/2023   left breast   Cervical cancer (HCC)    Chronic hypotension    Chronic kidney disease    Previous now clear   Claustrophobia    Decompression injury of spinal cord    Depression    Family history of adverse reaction to anesthesia      my mother takes a long time time wake up   History of dysplastic nevus 01/18/2018   right shoulder, recurrent dysplastic nevus   History of kidney stones    History of shingles    Migraine    Multiple allergies    Obesity    Osteoarthritis    left hip   Pneumonia    PONV (postoperative nausea and  vomiting)    slow to wake up   Sleep apnea    does not wear CPAP   Slow to wake up after anesthesia    Stroke (HCC) 08/11/2020   spinal cord stroke, spinal cord puncture during surgery   Wears glasses     Surgical History: Past Surgical History:  Procedure Laterality Date   ABDOMINAL HYSTERECTOMY     APPENDECTOMY     BACK SURGERY     BILIOPANCREATIC DIVISION W DUODENAL SWITCH N/A    BREAST BIOPSY Left 12/06/2022   u/s bx,1:00 heart clip, path pending   BREAST BIOPSY Left 12/06/2022   us  bx 2:00 ribbon clip path pending   BREAST BIOPSY Left 12/06/2022   US  LT BREAST BX W LOC DEV EA ADD LESION IMG BX SPEC US  GUIDE 12/06/2022 [facility]   BREAST BIOPSY Left 12/06/2022   US  LT BREAST BX W LOC DEV 1ST LESION IMG BX SPEC US  GUIDE 12/06/2022 [facility]   BREAST RECONSTRUCTION WITH PLACEMENT OF TISSUE EXPANDER AND FLEX HD (ACELLULAR HYDRATED DERMIS) Bilateral 01/18/2023   Procedure: IMMEDIATE LEFT BREAST RECONSTRUCTION WITH PLACEMENT OF TISSUE EXPANDER AND FLEX HD (ACELLULAR HYDRATED DERMIS);  Surgeon: [name], DO;  Location: [facility];  Service: Plastics;  Laterality: Bilateral;   BREAST RECONSTRUCTION WITH  PLACEMENT OF TISSUE EXPANDER AND FLEX HD (ACELLULAR HYDRATED DERMIS) Left 03/26/2023   Procedure: BREAST RECONSTRUCTION WITH PLACEMENT OF TISSUE EXPANDER;  Surgeon: [name], DO;  Location: [facility];  Service: Plastics;  Laterality: Left;   CHOLECYSTECTOMY     COLONOSCOPY N/A 06/27/2021   Procedure: COLONOSCOPY;  Surgeon: Onita Elspeth Sharper, DO;  Location: [facility]COPY;  Service: Gastroenterology;  Laterality: N/A;   COLONOSCOPY N/A 08/27/2023   Procedure: COLONOSCOPY;  Surgeon: Onita Elspeth Sharper, DO;  Location: [facility] ENDOSCOPY;  Service: Gastroenterology;  Laterality: N/A;   DIAGNOSTIC LAPAROSCOPY     LOA   ESOPHAGOGASTRODUODENOSCOPY N/A 06/27/2021   Procedure: ESOPHAGOGASTRODUODENOSCOPY (EGD);  Surgeon: Onita Elspeth Sharper,  DO;  Location: [facility] ENDOSCOPY;  Service: Gastroenterology;  Laterality: N/A;   ESOPHAGOGASTRODUODENOSCOPY (EGD) WITH PROPOFOL  N/A 07/25/2017   Procedure: ESOPHAGOGASTRODUODENOSCOPY (EGD) WITH PROPOFOL ;  Surgeon: Viktoria Lamar DASEN, MD;  Location: [facility] ENDOSCOPY;  Service: Endoscopy;  Laterality: N/A;   FLEXIBLE BRONCHOSCOPY     HERNIA REPAIR     KNEE ARTHROSCOPY     LAPAROSCOPIC GASTRIC SLEEVE RESECTION WITH HIATAL HERNIA REPAIR     LUMBAR FUSION  08/29/2022   Revision Lumbar two through five Laminectomy & Fusion with Transforaminal Lumbar interbody fusion   MASTECTOMY W/ SENTINEL NODE BIOPSY Bilateral 01/18/2023   Procedure: MASTECTOMY WITH SENTINEL LYMPH NODE BIOPSY, RNFA to assist;  Surgeon: Jordis Laneta FALCON, MD;  Location: [facility];  Service: General;  Laterality: Bilateral;   REMOVAL OF TISSUE EXPANDER AND PLACEMENT OF IMPLANT Bilateral 06/06/2023   Procedure: REMOVAL OF TISSUE EXPANDER AND PLACEMENT OF IMPLANT;  Surgeon: [name], DO;  Location:  SURGERY CENTER;  Service: Plastics;  Laterality: Bilateral;   SPINAL CORD STIMULATOR IMPLANT     TONSILLECTOMY     TOTAL HIP ARTHROPLASTY Left 01/26/2016   Procedure: LEFT TOTAL HIP ARTHROPLASTY ANTERIOR APPROACH;  Surgeon: Kay CHRISTELLA Cummins, MD;  Location: [facility];  Service: Orthopedics;  Laterality: Left;   TRANSFORAMINAL LUMBAR INTERBODY FUSION (TLIF) WITH PEDICLE SCREW FIXATION 3 LEVEL  08/2019   Lynwood Better, MD L2-L5    Home Medications:  Allergies as of 09/17/2023       Reactions   Bee Venom Swelling   Iodinated Contrast Media Anaphylaxis   Swelling Other reaction(s): Other (See Comments)  Swelling   Swelling   Latex Hives, Rash   Morphine  Other (See Comments)   BRADYCARDIA   Shellfish Allergy Anaphylaxis   Codeine  Nausea And Vomiting   Meperidine Hcl Other (See Comments)   BRADYCARDIA   Bee Pollen Other (See Comments)   Unknown   Oxycodone  Hives, Itching   Blisters on back   Tape Hives   Adhesive    Pentazocine Lactate Nausea And Vomiting   REACTION: vomiting with Talwin NX   Propoxyphene Nausea Only, Nausea And Vomiting        Medication List        Accurate as of September 17, 2023  8:27 AM. If you have any questions, ask your nurse or doctor.          azelastine 0.1 % nasal spray Commonly known as: ASTELIN Place 1 spray into both nostrils 2 (two) times daily. Use in each nostril as directed   baclofen  10 MG tablet Commonly known as: LIORESAL  Take 1 tablet (10 mg total) by mouth 3 (three) times daily. - for spasms   colestipol 1 g tablet Commonly known as: COLESTID Take by mouth.   cyanocobalamin  1000 MCG tablet Commonly known as: VITAMIN  B12 Take 1 tablet (1,000 mcg total) by mouth daily.   diazepam  2 MG tablet Commonly known as: Valium  Take 1 tablet (2 mg total) by mouth every 12 (twelve) hours as needed for up to 5 doses for muscle spasms.   diclofenac  Sodium 1 % Gel Commonly known as: VOLTAREN  Apply 2 g topically 4 (four) times daily.   DULoxetine  60 MG capsule Commonly known as: Cymbalta  Take 2 capsules (120 mg total) by mouth at bedtime.   EPINEPHrine  0.3 mg/0.3 mL Soaj injection Commonly known as: EPI-PEN   fluticasone  50 MCG/ACT nasal spray Commonly known as: FLONASE  Place 2 sprays into both nostrils daily.   furosemide 20 MG tablet Commonly known as: LASIX Take 20 mg by mouth daily as needed.   gabapentin  300 MG capsule Commonly known as: NEURONTIN  Take 1 capsule (300 mg total) by mouth at bedtime. For nerve pain   letrozole  2.5 MG tablet Commonly known as: FEMARA  Take 1 tablet (2.5 mg total) by mouth daily.   methocarbamol  500 MG tablet Commonly known as: ROBAXIN  Take 500 mg by mouth 4 (four) times daily.   montelukast  10 MG tablet Commonly known as: SINGULAIR  TAKE 1 TABLET BY MOUTH AT  BEDTIME FOR ALLERGIES   multivitamin with minerals Tabs tablet Take 1 tablet by mouth 2 (two) times daily.   pramipexole  0.125 MG  tablet Commonly known as: MIRAPEX  Take by mouth.   pyridOXINE 100 MG tablet Commonly known as: VITAMIN B6 Take 1 tablet (100 mg total) by mouth daily.   thiamine 250 MG tablet Take 250 mg by mouth daily.   tolterodine  4 MG 24 hr capsule Commonly known as: Detrol  LA Take 1 capsule (4 mg total) by mouth daily.   topiramate  100 MG tablet Commonly known as: TOPAMAX  TAKE 1 TABLET BY MOUTH AT  BEDTIME   traMADol  50 MG tablet Commonly known as: ULTRAM  Take 1 tablet (50 mg total) by mouth every 12 (twelve) hours as needed.        Allergies:  Allergies  Allergen Reactions   Bee Venom Swelling   Iodinated Contrast Media Anaphylaxis    Swelling  Other reaction(s): Other (See Comments)  Swelling   Swelling   Latex Hives and Rash   Morphine  Other (See Comments)    BRADYCARDIA   Shellfish Allergy Anaphylaxis   Codeine  Nausea And Vomiting   Meperidine Hcl Other (See Comments)    BRADYCARDIA   Bee Pollen Other (See Comments)    Unknown   Oxycodone  Hives and Itching    Blisters on back   Tape Hives    Adhesive   Pentazocine Lactate Nausea And Vomiting    REACTION: vomiting with Talwin NX   Propoxyphene Nausea Only and Nausea And Vomiting    Family History: Family History  Problem Relation Age of Onset   Renal Disease Mother    Hypertension Mother    Sudden Cardiac Death Mother    Heart failure Mother    Valvular heart disease Mother    Heart disease Mother    Breast cancer Maternal Grandmother    Stroke Brother    Heart attack Brother 88   Diabetes Other     Social History:  reports that she has never smoked. She has never been exposed to tobacco smoke. She has never used smokeless tobacco. She reports that she does not currently use alcohol. She reports that she does not use drugs.  ROS:  Physical Exam: BP 126/77   Pulse 73   Ht 5' 4 (1.626 m)   Wt 115.2 kg   BMI 43.60 kg/m    Constitutional:  Alert and oriented, No acute distress. HEENT: [place] AT, moist mucus membranes.  Trachea midline, no masses. Cardiovascular: No clubbing, cyanosis, or edema. Respiratory: Normal respiratory effort, no increased work of breathing. GI: Abdomen is soft, nontender, nondistended, no abdominal masses GU: Mild grade 2 hypermobility the bladder neck and negative cough test.  No prolapse Skin: No rashes, bruises or suspicious lesions. Lymph: No cervical or inguinal adenopathy. Neurologic: Grossly intact, no focal deficits, moving all 4 extremities. Psychiatric: Normal mood and affect.  Laboratory Data: Lab Results  Component Value Date   WBC 3.5 (L) 09/07/2023   HGB 12.3 09/07/2023   HCT 38.5 09/07/2023   MCV 99.5 09/07/2023   PLT 273 09/07/2023    Lab Results  Component Value Date   CREATININE 0.94 09/07/2023    No results found for: PSA  No results found for: TESTOSTERONE  Lab Results  Component Value Date   HGBA1C 5.1 06/24/2013    Urinalysis    Component Value Date/Time   COLORURINE STRAW (A) 08/22/2019 1148   APPEARANCEUR Clear 01/17/2021 1549   LABSPEC 1.008 08/22/2019 1148   PHURINE 7.0 08/22/2019 1148   GLUCOSEU Negative 01/17/2021 1549   HGBUR NEGATIVE 08/22/2019 1148   BILIRUBINUR Negative 01/17/2021 1549   KETONESUR NEGATIVE 08/22/2019 1148   PROTEINUR Negative 01/17/2021 1549   PROTEINUR NEGATIVE 08/22/2019 1148   NITRITE Positive (A) 01/17/2021 1549   NITRITE NEGATIVE 08/22/2019 1148   LEUKOCYTESUR 1+ (A) 01/17/2021 1549   LEUKOCYTESUR NEGATIVE 08/22/2019 1148    Pertinent Imaging: Urine reviewed.  Urine sent for culture.  Chart reviewed.  Assessment & Plan: Patient has mixed incontinence.  She has mild bedwetting.  She has risk factors for neurogenic bladder and believes her symptoms worsened following the event in 2021.  She has a family history of bladder cancer.  It does appear she has more of an overactive bladder and she also has  significant frequency and mild to moderate nocturia.  Role of urodynamics and cystoscopy discussed  1. Dysuria (Primary)  - Urinalysis, Complete   No follow-ups on file.  Glendia DELENA Elizabeth, MD  [facility] [address] [place], [place] [address] [phone]

## 2023-09-17 NOTE — Patient Instructions (Signed)

## 2023-09-18 ENCOUNTER — Ambulatory Visit: Payer: Medicaid Other | Admitting: Surgical

## 2023-09-18 DIAGNOSIS — Z9889 Other specified postprocedural states: Secondary | ICD-10-CM

## 2023-09-18 DIAGNOSIS — L989 Disorder of the skin and subcutaneous tissue, unspecified: Secondary | ICD-10-CM

## 2023-09-18 NOTE — Therapy (Signed)
 OUTPATIENT PHYSICAL THERAPY SHOULDER and ORTHO TREATMENT NOTE    Patient Name: Vanessa Medina MRN: [id number] DOB:11-21-1970, 53 y.o., female Today's Date: 09/19/2023  END OF SESSION:  PT End of Session - 09/19/23 1307     Visit Number 12    Number of Visits 34    Date for PT Re-Evaluation 11/15/23    Progress Note Due on Visit 10    PT Start Time 1314    PT Stop Time 1350    PT Time Calculation (min) 36 min    Equipment Utilized During Treatment Gait belt    Activity Tolerance Patient tolerated treatment well;Patient limited by pain;Patient limited by fatigue    Behavior During Therapy [facility] for tasks assessed/performed                       Past Medical History:  Diagnosis Date   Acute nontraumatic kidney injury (HCC)    Anemia    Anxiety    Benign cyst of kidney    Breast cancer (HCC) 01/18/2023   left breast   Cervical cancer (HCC)    Chronic hypotension    Chronic kidney disease    Previous now clear   Claustrophobia    Decompression injury of spinal cord    Depression    Family history of adverse reaction to anesthesia     " my mother takes a long time time wake up"   History of dysplastic nevus 01/18/2018   right shoulder, recurrent dysplastic nevus   History of kidney stones    History of shingles    Migraine    Multiple allergies    Obesity    Osteoarthritis    left hip   Pneumonia    PONV (postoperative nausea and vomiting)    slow to wake up   Sleep apnea    does not wear CPAP   Slow to wake up after anesthesia    Stroke (HCC) 08/11/2020   spinal cord stroke, spinal cord puncture during surgery   Wears glasses    Past Surgical History:  Procedure Laterality Date   ABDOMINAL HYSTERECTOMY     APPENDECTOMY     BACK SURGERY     BILIOPANCREATIC DIVISION W DUODENAL SWITCH N/A    BREAST BIOPSY Left 12/06/2022   u/s bx,1:00 heart clip, path pending   BREAST BIOPSY Left 12/06/2022   us  bx 2:00 ribbon clip path pending   BREAST  BIOPSY Left 12/06/2022   US  LT BREAST BX W LOC DEV EA ADD LESION IMG BX SPEC US  GUIDE 12/06/2022 [facility]   BREAST BIOPSY Left 12/06/2022   US  LT BREAST BX W LOC DEV 1ST LESION IMG BX SPEC US  GUIDE 12/06/2022 [facility]   BREAST RECONSTRUCTION WITH PLACEMENT OF TISSUE EXPANDER AND FLEX HD (ACELLULAR HYDRATED DERMIS) Bilateral 01/18/2023   Procedure: IMMEDIATE LEFT BREAST RECONSTRUCTION WITH PLACEMENT OF TISSUE EXPANDER AND FLEX HD (ACELLULAR HYDRATED DERMIS);  Surgeon: Thornell Flirt, DO;  Location: [facility];  Service: Plastics;  Laterality: Bilateral;   BREAST RECONSTRUCTION WITH PLACEMENT OF TISSUE EXPANDER AND FLEX HD (ACELLULAR HYDRATED DERMIS) Left 03/26/2023   Procedure: BREAST RECONSTRUCTION WITH PLACEMENT OF TISSUE EXPANDER;  Surgeon: Thornell Flirt, DO;  Location: [facility];  Service: Plastics;  Laterality: Left;   CHOLECYSTECTOMY     COLONOSCOPY N/A 06/27/2021   Procedure: COLONOSCOPY;  Surgeon: Quintin Buckle, DO;  Location: [facility] END[facility]COPY;  Service: Gastroenterology;  Laterality: N/A;   COLONOSCOPY N/A 08/27/2023  Procedure: COLONOSCOPY;  Surgeon: Quintin Buckle, DO;  Location: [facility] ENDOSCOPY;  Service: Gastroenterology;  Laterality: N/A;   DIAGNOSTIC LAPAROSCOPY     LOA   ESOPHAGOGASTRODUODENOSCOPY N/A 06/27/2021   Procedure: ESOPHAGOGASTRODUODENOSCOPY (EGD);  Surgeon: Quintin Buckle, DO;  Location: [facility] ENDOSCOPY;  Service: Gastroenterology;  Laterality: N/A;   ESOPHAGOGASTRODUODENOSCOPY (EGD) WITH PROPOFOL  N/A 07/25/2017   Procedure: ESOPHAGOGASTRODUODENOSCOPY (EGD) WITH PROPOFOL ;  Surgeon: Cassie Click, MD;  Location: [facility] ENDOSCOPY;  Service: Endoscopy;  Laterality: N/A;   FLEXIBLE BRONCHOSCOPY     HERNIA REPAIR     KNEE ARTHROSCOPY     LAPAROSCOPIC GASTRIC SLEEVE RESECTION WITH HIATAL HERNIA REPAIR     LUMBAR FUSION  08/29/2022   Revision Lumbar two through five Laminectomy & Fusion with Transforaminal Lumbar  interbody fusion   MASTECTOMY W/ SENTINEL NODE BIOPSY Bilateral 01/18/2023   Procedure: MASTECTOMY WITH SENTINEL LYMPH NODE BIOPSY, RNFA to assist;  Surgeon: Alben Alma, MD;  Location: [facility];  Service: General;  Laterality: Bilateral;   REMOVAL OF TISSUE EXPANDER AND PLACEMENT OF IMPLANT Bilateral 06/06/2023   Procedure: REMOVAL OF TISSUE EXPANDER AND PLACEMENT OF IMPLANT;  Surgeon: Thornell Flirt, DO;  Location: [facility];  Service: Plastics;  Laterality: Bilateral;   SPINAL CORD STIMULATOR IMPLANT     TONSILLECTOMY     TOTAL HIP ARTHROPLASTY Left 01/26/2016   Procedure: LEFT TOTAL HIP ARTHROPLASTY ANTERIOR APPROACH;  Surgeon: Wes Hamman, MD;  Location: [facility];  Service: Orthopedics;  Laterality: Left;   TRANSFORAMINAL LUMBAR INTERBODY FUSION (TLIF) WITH PEDICLE SCREW FIXATION 3 LEVEL  08/2019   Amalia Badder, MD L2-L5   Patient Active Problem List   Diagnosis Date Noted   Long term (current) use of aromatase inhibitors 09/07/2023   At risk for loss of bone density 09/07/2023   Changing skin lesion 09/04/2023   Frequent falls 08/22/2023   Chronic pain syndrome 08/22/2023   Bilateral lower extremity edema 08/06/2023   Back pain 08/06/2023   Claustrophobia    Pernicious anemia 03/14/2023   Ruptured left breast implant 02/13/2023   S/P mastectomy, bilateral 01/18/2023   Genetic testing 12/18/2022   Breast cancer (HCC) 12/08/2022   Family history of breast cancer 12/08/2022   Profound fatigue 12/08/2022   Lumbar post-laminectomy syndrome 06/02/2022   Pseudarthrosis after fusion or arthrodesis 05/19/2022   Primary osteoarthritis of right knee 03/28/2022   Subjective tinnitus of both ears 12/21/2021   Abnormality of gait 04/29/2021   Abnormal sensation in both ears 04/06/2021   Other adverse food reactions, not elsewhere classified, subsequent encounter 04/06/2021   Multiple drug allergies 04/06/2021   Insomnia due to medical condition 02/25/2021    Myofascial muscle pain 02/25/2021   Nerve pain 10/18/2020   Incomplete paraplegia (HCC) 10/18/2020   Orthostatic hypotension 10/18/2020   Renal cyst 09/23/2020   Chronic migraine without aura without status migrainosus, not intractable    Hypokalemia    Adjustment reaction with anxiety and depression    Spinal cord injury, lumbar, without spinal bone injury, sequela (HCC) 08/18/2020   Paraparesis (HCC)    Post-operative pain    Hypotension    Slow transit constipation    AKI (acute kidney injury) (HCC)    Right leg weakness 08/11/2020   DDD (degenerative disc disease), lumbar 09/02/2019    Class: Chronic   Degenerative disc disease, lumbar 09/02/2019   Chest tightness 09/23/2018   Bradycardia 09/23/2018   BMI 40.0-44.9, adult (HCC) 04/17/2018   Status post bariatric surgery 04/17/2018  Labile blood pressure 03/08/2018   Chronic low back pain 09/03/2017   History of total hip replacement, left 01/26/2016   Osteoarthritis of spine with radiculopathy, lumbar region 10/16/2014   LEG PAIN, BILATERAL 12/16/2007   Malignant neoplasm of cervix uteri (HCC) 07/02/2007   MORBID OBESITY 07/02/2007   DEPRESSION 07/02/2007   Migraine without aura 07/02/2007   Other allergic rhinitis 07/02/2007   Asthma 07/02/2007   GERD 07/02/2007   ELEVATED BLOOD PRESSURE WITHOUT DIAGNOSIS OF HYPERTENSION 07/02/2007   Asthma 07/02/2007    PCP: Lyle San MD  REFERRING PROVIDER: Lamount Pimple  REFERRING DIAG: s/p breast reconstruction  THERAPY DIAG:  Muscle weakness (generalized)  Difficulty in walking, not elsewhere classified  Unsteadiness on feet  Rationale for Evaluation and Treatment: Rehabilitation  ONSET DATE: December 07 2022  SUBJECTIVE:                                                                                                                                                                                      SUBJECTIVE STATEMENT: Patient has to leave slighlty early for  cancer appt.  Hand dominance: Right  PERTINENT HISTORY: Patient is having LUE ROM issues s/p breast cancer implant reconstruction. Patient underwent bilateral exchange of tissue expanders for implants, bilateral capsulotomies for implant repositioning and right breast capsulectomy with major repositioning of 3 x 8 cm of capsule on 06/06/2023. Patient had back surgery December 26th 2023 and then was diagnosed with breast cancer December 07, 2022. Had surgery Jan 18, 2023 for her breast cancer, had double mastectomy. Patient had two sets of expanders, first one burst, the second one kept flipping and moving. Had to have more work on L side. PMH includes: anemia, anxiety, breast cancer, CKD, claustrophobia, depression, migraines, OA, PONV, sleep apnea, stroke, lumbar fusion, mastectomy.   08/23/2023:  Pt with new orders to address back pain prior to possible surgery for [facility] stimulator. Pt with hx of multiple back surgeries with chronic pain and now worsening pain with decline in mobility. Worst pain reaches 10/10, best 4/10. Pt ideally would like to improve pain. She would also like to improve her ability to walk, reports has not been able to go for walks in a while. Was ambulating without an AD for a while but now must use SPC again. She reports ability to stand, such as to do dishes, is limited due to pain. Pt experiences radiating pain down RLE into R foot and toes.     PAIN:  Are you having pain? Yes: NPRS scale: 4/10 Pain location: L shoulder and breast Pain description: post surgical Aggravating factors: lift something  Relieving factors: none Worst: 7/10 Least: 2/10  PRECAUTIONS: Other: no lifting greater than 1/2 gallon  , pt reports LATEX ALLERGY   RED FLAGS: None   WEIGHT BEARING RESTRICTIONS:  no more than 1/2 gallon  FALLS:  Has patient fallen in last 6 months? Yes. Number of falls 1  LIVING ENVIRONMENT: Lives with: lives with their family Lives in: House/apartment Stairs: Yes:  External: 2 steps; on right going up and on left going up Has following equipment at home: Single point cane, Walker - 2 wheeled, shower chair, and Grab bars  OCCUPATION: Disability   PLOF: Independent with basic ADLs  PATIENT GOALS:to decrease pain in L shoulder;   NEXT MD VISIT:   OBJECTIVE:  Note: Objective measures were completed at Evaluation unless otherwise noted.  DIAGNOSTIC FINDINGS:  N/a   PATIENT SURVEYS:  FOTO 32  COGNITION: Overall cognitive status:  some short term memory      SENSATION: WFL  POSTURE: Rounding shoulders,   UPPER EXTREMITY ROM:   Active ROM Right eval Left eval  Shoulder flexion 115* 90  Shoulder extension 48 41  Shoulder abduction 81* 89  Shoulder adduction    Shoulder internal rotation    Shoulder external rotation Unable to test in testing position Unable to test in testing position  (Blank rows = not tested) *  UPPER EXTREMITY MMT:   LE MMT: 08/23/23 Gross BLE strength is 4-/5, pain-limited RLE   SHOULDER SPECIAL TESTS: Deferred due to limited ROM   JOINT MOBILITY TESTING:  GH: hypomobile AP, PA, inferior glide bilateral   PALPATION:  Muscle guarding subscap, pectoral, glenohumeral region  Significant fluid on L flank    TODAY'S TREATMENT:                                                                                                                                         DATE: 09/19/23  TherEx:  Posterior pelvic tilt 10x Posterior pelvic tilt 10x with RTB abduction    TrA activation 10x 3 second holds  TrA activation with UE raises 10x each side Single limb hamstring curl swiss ball 15x each side  Knee abduction to 45 degrees use core to return to neutral 10x;  very challenging RLE due to neuro  Seated trunk flexion stretch with PT overpressure 10x  Supine:  Hamstring lengthening stretch 60 seconds each LE Single knee to chest 60 seconds each LE Modified figure four stretch 60 seconds each LE   PATIENT  EDUCATION: Education details: goals, plan Person educated: Patient Education method: Explanation, Demonstration, Tactile cues, and Verbal cues Education comprehension: verbalized understanding, returned demonstration, verbal cues required, and tactile cues required  HOME EXERCISE PROGRAM:    PREV Access Code: [email or web address] URL: [email or web address] Date: 08/16/2023 Prepared by: Aminta Kales  Exercises - Standing Shoulder External Rotation with Resistance  - 1 x daily - 5-6 x weekly - 3 sets - 10 reps - Supine Alternating Shoulder Flexion  - 1  x daily - 5-6 x weekly - 3 sets - 10 reps - Seated Shoulder Row with Anchored Resistance  - 1 x daily - 5-6 x weekly - 3 sets - 10 reps   12/2: Access Code: [email or web address] URL: [email or web address] Date: 08/06/2023 Prepared by: Aminta Kales  Exercises - Seated Shoulder Flexion Towel Slide at Table Top  - 1 x daily - 5-7 x weekly - 2 sets - 10 reps - 15-30 sec hold - Seated Shoulder Abduction Towel Slide at Table Top  - 1 x daily - 5-7 x weekly - 2 sets - 10 reps - 15-30 sec hold  11/26: updated Access Code: [email or web address] URL: [email or web address] Date: 07/31/2023 Prepared by: Aminta Kales  Exercises - Isometric Shoulder Flexion at Wall  - 1 x daily - 7 x weekly - 2 sets - 10 reps - Isometric Shoulder Extension at Wall  - 1 x daily - 7 x weekly - 2 sets - 10 reps - Standing Isometric Shoulder Internal Rotation at Doorway  - 1 x daily - 7 x weekly - 2 sets - 10 reps - Standing Isometric Shoulder External Rotation with Doorway  - 1 x daily - 7 x weekly - 2 sets - 10 reps  Access Code: [email or web address] URL: [email or web address] Date: 07/02/2023 Prepared by: Aminta Kales  Exercises - Seated Shoulder Shrugs  - 2 x daily - 6-7 x weekly - 3 sets - 10 reps - Seated Shoulder Shrug Circles AROM Backward  - 2 x daily - 6-7 x weekly - 3 sets - 10 reps - Seated Scapular Retraction  - 1 x daily - 6-7 x weekly -  3 sets - 10 reps  ASSESSMENT:  CLINICAL IMPRESSION: Patient is highly motivated despite increased pain from having to help her dad off the floor. Core and LE strengthening is painful intermittently but tolerated. Head pad reduces spasm and pain.  Patient will benefit from skilled physical therapy to improve ROM and strength and decrease pain for improved quality of life.     OBJECTIVE IMPAIRMENTS: Abnormal gait, decreased endurance, decreased mobility, decreased ROM, decreased strength, hypomobility, increased edema, increased fascial restrictions, impaired perceived functional ability, increased muscle spasms, impaired flexibility, impaired UE functional use, improper body mechanics, postural dysfunction, and pain.   ACTIVITY LIMITATIONS: carrying, lifting, bed mobility, bathing, toileting, dressing, self feeding, reach over head, hygiene/grooming, and caring for others  PARTICIPATION LIMITATIONS: meal prep, cleaning, laundry, medication management, shopping, community activity, and yard work  PERSONAL FACTORS: Age, Fitness, Past/current experiences, Time since onset of injury/illness/exacerbation, and 3+ comorbidities: anemia, anxiety, breast cancer, CKD, claustrophobia, depression, migraines, OA, PONV, sleep apnea, stroke, lumbar fusion, mastectomy.   are also affecting patient's functional outcome.   REHAB POTENTIAL: Good  CLINICAL DECISION MAKING: Evolving/moderate complexity  EVALUATION COMPLEXITY: Moderate   GOALS: Goals reviewed with patient? Yes  SHORT TERM GOALS: Target date: 10/04/2023      Patient will be independent in home exercise program to improve strength/mobility for better functional independence with ADLs. Baseline: 10/23: give next session ;12/19: pt indep Goal status: MET    LONG TERM GOALS: Target date: 11/15/2023    Patient will increase FOTO score to equal to or greater than 49    to demonstrate statistically significant improvement in mobility and  quality of life.  Baseline: 32; 08/23/2023: 31 Goal status: INITIAL  2.  Patient will report a worst pain of 3/10 on VAS in L shoulder  to improve tolerance with ADLs and reduced symptoms with activities.  Baseline: 7/10; 12/19:  pt reports worst pain is a 6/10, felt in the evenings Goal status: ONGOING  3.  Patient will improve shoulder AROM to > 140 degrees of flexion, scaption, and abduction for improved ability to perform overhead activities. Baseline:12/19: flexion 160 deg; abduction ~140 pain limited Goal status: PARTIALLY MET  4.  Patient will improve shoulder strength to 4/5 for improved functional ADL and iADL performance Baseline: 16/10: unable to perform today due to pain and ROM limitations;12/19: grossly 4-/5  Goal status: ONGOING  5. Patient will increase six minute walk test distance to >1000 for progression to community ambulator and improve gait ability  Baseline: 652 ft with SPC; 12/19: 391 ft with SPC  Goal status: INITIAL  6. Patient will reduce modified Oswestry score to <50 as to demonstrate decreased disability with ADLs including improved sleeping tolerance, walking/sitting tolerance etc for better mobility with ADLs.  Baseline: 78% Goal status: NEW   7. Patient will increase BLE gross strength to 4+/5 as to improve functional strength for independent gait, increased standing tolerance and increased ADL ability. Baseline:Gross BLE strength is 4-/5, pain-limited RLE  Goal status: NEW   PLAN:  PT FREQUENCY: 2x/week  PT DURATION: 12 weeks  PLANNED INTERVENTIONS: 97164- PT Re-evaluation, 97110-Therapeutic exercises, 97530- Therapeutic activity, 97112- Neuromuscular re-education, 97535- Self Care, 96045- Manual therapy, 97116- Gait training, 97014- Electrical stimulation (unattended), [phone]- Electrical stimulation (manual), [phone]- Traction (mechanical), Patient/Family education, Balance training, Taping, Joint mobilization, Spinal mobilization, Manual lymph  drainage, Scar mobilization, Compression bandaging, Vestibular training, Visual/preceptual remediation/compensation, Cognitive remediation, Cryotherapy, Moist heat, and Biofeedback  PLAN FOR NEXT SESSION:  ROM Of shoulder, isometrics, progress HEP as pt able, endurance, LE strengthening if time permits   Tabbatha Bordelon, PT 09/19/2023, 1:50 PM

## 2023-09-18 NOTE — Progress Notes (Signed)
 Patient is a 53 year old female here for follow-up after excision of left breast skin lesion with Dr. Lowery on 09/04/2023.  Pathology showed spongiotic dermatitis, ulcerated.  No malignancy seen.  She reports she is overall doing well.  On exam left upper breast incision appears intact, there is some scabbing and drainage surrounding the incision.  There is no cellulitic changes.  There is some erythema/irritation noted.  There is some firmness noted under the incision.  No subcutaneous fluid collection.  No tenderness noted palpation.  A/P:  Patient is doing well after excision of left breast skin lesion, no malignancy seen.  Pathology showed spongiotic dermatitis.  Recommend Vaseline to the area, Monocryl suture was removed.  Recommend following up in 6 months in regards to her breast reconstruction to discuss possibility for revision/fat grafting with Dr. Lowery for improved symmetry and shape.  There is no signs of infection or concern on exam, call with questions or concerns.

## 2023-09-19 ENCOUNTER — Inpatient Hospital Stay: Payer: Medicaid Other | Admitting: Occupational Therapy

## 2023-09-19 ENCOUNTER — Ambulatory Visit: Payer: Medicaid Other

## 2023-09-19 DIAGNOSIS — M6281 Muscle weakness (generalized): Secondary | ICD-10-CM

## 2023-09-19 DIAGNOSIS — R262 Difficulty in walking, not elsewhere classified: Secondary | ICD-10-CM

## 2023-09-19 DIAGNOSIS — C50812 Malignant neoplasm of overlapping sites of left female breast: Secondary | ICD-10-CM | POA: Diagnosis not present

## 2023-09-19 DIAGNOSIS — I972 Postmastectomy lymphedema syndrome: Secondary | ICD-10-CM

## 2023-09-19 DIAGNOSIS — R2681 Unsteadiness on feet: Secondary | ICD-10-CM

## 2023-09-19 LAB — CULTURE, URINE COMPREHENSIVE

## 2023-09-19 NOTE — Therapy (Signed)
 [facility] [address] [place], [place], [address] Phone: [phone]   Fax:  [phone]  Occupational Therapy Treatment  Patient Details  Name: Vanessa Medina MRN: [id number] Date of Birth: 01-12-1971 No data recorded  Encounter Date: 09/19/2023   OT End of Session - 09/19/23 1612     Visit Number 5    Number of Visits 12    Date for OT Re-Evaluation 10/03/23    OT Start Time 1412    OT Stop Time 1436    OT Time Calculation (min) 24 min    Activity Tolerance Patient tolerated treatment well    Behavior During Therapy [facility] for tasks assessed/performed             Past Medical History:  Diagnosis Date   Acute nontraumatic kidney injury (HCC)    Anemia    Anxiety    Benign cyst of kidney    Breast cancer (HCC) 01/18/2023   left breast   Cervical cancer (HCC)    Chronic hypotension    Chronic kidney disease    Previous now clear   Claustrophobia    Decompression injury of spinal cord    Depression    Family history of adverse reaction to anesthesia     " my mother takes a long time time wake up"   History of dysplastic nevus 01/18/2018   right shoulder, recurrent dysplastic nevus   History of kidney stones    History of shingles    Migraine    Multiple allergies    Obesity    Osteoarthritis    left hip   Pneumonia    PONV (postoperative nausea and vomiting)    slow to wake up   Sleep apnea    does not wear CPAP   Slow to wake up after anesthesia    Stroke (HCC) 08/11/2020   spinal cord stroke, spinal cord puncture during surgery   Wears glasses     Past Surgical History:  Procedure Laterality Date   ABDOMINAL HYSTERECTOMY     APPENDECTOMY     BACK SURGERY     BILIOPANCREATIC DIVISION W DUODENAL SWITCH N/A    BREAST BIOPSY Left 12/06/2022   u/s bx,1:00 heart clip, path pending   BREAST BIOPSY Left 12/06/2022   us  bx 2:00 ribbon clip path pending   BREAST BIOPSY Left 12/06/2022   US  LT  BREAST BX W LOC DEV EA ADD LESION IMG BX SPEC US  GUIDE 12/06/2022 [facility]   BREAST BIOPSY Left 12/06/2022   US  LT BREAST BX W LOC DEV 1ST LESION IMG BX SPEC US  GUIDE 12/06/2022 [facility]   BREAST RECONSTRUCTION WITH PLACEMENT OF TISSUE EXPANDER AND FLEX HD (ACELLULAR HYDRATED DERMIS) Bilateral 01/18/2023   Procedure: IMMEDIATE LEFT BREAST RECONSTRUCTION WITH PLACEMENT OF TISSUE EXPANDER AND FLEX HD (ACELLULAR HYDRATED DERMIS);  Surgeon: Thornell Flirt, DO;  Location: [facility];  Service: Plastics;  Laterality: Bilateral;   BREAST RECONSTRUCTION WITH PLACEMENT OF TISSUE EXPANDER AND FLEX HD (ACELLULAR HYDRATED DERMIS) Left 03/26/2023   Procedure: BREAST RECONSTRUCTION WITH PLACEMENT OF TISSUE EXPANDER;  Surgeon: Thornell Flirt, DO;  Location: [facility];  Service: Plastics;  Laterality: Left;   CHOLECYSTECTOMY     COLONOSCOPY N/A 06/27/2021   Procedure: COLONOSCOPY;  Surgeon: Quintin Buckle, DO;  Location: [facility];  Service: Gastroenterology;  Laterality: N/A;   COLONOSCOPY N/A 08/27/2023   Procedure: COLONOSCOPY;  Surgeon: Quintin Buckle, DO;  Location: [facility];  Service: Gastroenterology;  Laterality: N/A;   DIAGNOSTIC LAPAROSCOPY     LOA   ESOPHAGOGASTRODUODENOSCOPY N/A 06/27/2021   Procedure: ESOPHAGOGASTRODUODENOSCOPY (EGD);  Surgeon: Quintin Buckle, DO;  Location: [facility];  Service: Gastroenterology;  Laterality: N/A;   ESOPHAGOGASTRODUODENOSCOPY (EGD) WITH PROPOFOL  N/A 07/25/2017   Procedure: ESOPHAGOGASTRODUODENOSCOPY (EGD) WITH PROPOFOL ;  Surgeon: Cassie Click, MD;  Location: [facility] ENDOSCOPY;  Service: Endoscopy;  Laterality: N/A;   FLEXIBLE BRONCHOSCOPY     HERNIA REPAIR     KNEE ARTHROSCOPY     LAPAROSCOPIC GASTRIC SLEEVE RESECTION WITH HIATAL HERNIA REPAIR     LUMBAR FUSION  08/29/2022   Revision Lumbar two through five Laminectomy & Fusion with Transforaminal Lumbar interbody fusion   MASTECTOMY W/  SENTINEL NODE BIOPSY Bilateral 01/18/2023   Procedure: MASTECTOMY WITH SENTINEL LYMPH NODE BIOPSY, RNFA to assist;  Surgeon: Alben Alma, MD;  Location: [facility];  Service: General;  Laterality: Bilateral;   REMOVAL OF TISSUE EXPANDER AND PLACEMENT OF IMPLANT Bilateral 06/06/2023   Procedure: REMOVAL OF TISSUE EXPANDER AND PLACEMENT OF IMPLANT;  Surgeon: Thornell Flirt, DO;  Location: [facility];  Service: Plastics;  Laterality: Bilateral;   SPINAL CORD STIMULATOR IMPLANT     TONSILLECTOMY     TOTAL HIP ARTHROPLASTY Left 01/26/2016   Procedure: LEFT TOTAL HIP ARTHROPLASTY ANTERIOR APPROACH;  Surgeon: Wes Hamman, MD;  Location: [facility];  Service: Orthopedics;  Laterality: Left;   TRANSFORAMINAL LUMBAR INTERBODY FUSION (TLIF) WITH PEDICLE SCREW FIXATION 3 LEVEL  08/2019   Amalia Badder, MD L2-L5    There were no vitals filed for this visit.             Patient arrived with over-the-counter [employer] sleeve in place on left upper extremity.  Patient has been wearing it for about  8 wks 5 days week - 6-8 hours during the day. Upon assessing circumference  decrease in R UE because of weight loss but also decrease on R at elbow and forearm- but bicep area increase and upper arm same  She had been wearing Jovipak for about month now but had procedure to L breast 2 wks ago - and considering that and wearing compression - was able to maintaining her lymphedema symptoms. Patient continue to wear it 6 to 8 hours during the day for the next 4 weeks.  As well as jovipak  Will reassess in month And she sees surgeon in 6 months to decide plan for reconstruction  Review again wearing of donning/doffing and wearing compression sleeve correctly -and jovipak                      OT Long Term Goals - 07/11/23 1819       OT LONG TERM GOAL #1   Title Patient to be independent in home program to increase active range of motion in bilateral shoulders with  functional limb at with pain less than a 2/10 pain    Baseline Patient followed by physical therapy    Status Deferred      OT LONG TERM GOAL #2   Title Monitor and prevent lymphedema in bilateral upper extremities and thoracic and assess if needed compression.    Baseline Patient is right-hand dominant.  Do show increased swelling in the left upper arm as well as chest but has a ruptured expander at the moment. NOW patient return after being seen in June.  Patient had multiple surgeries to the left breast.  Increased lymphedema  left breast, thoracic and upper arm.  Recommend compression with continue to monitor progress with home program    Time 12    Period Weeks    Status On-going    Target Date 10/03/23      OT LONG TERM GOAL #3   Title Bilateral shoulder strength improved for patient to pull overhead shirt and reach overhead with 2 pound weight independently    Baseline Patient get physical therapy    Status Deferred      OT LONG TERM GOAL #4   Title Bilateral shoulder external rotation for patient to be able to wash and do hair with no increase symptoms.    Baseline Patient receiving physical therapy    Status Deferred                     Patient will benefit from skilled therapeutic intervention in order to improve the following deficits and impairments:           Visit Diagnosis: Plan - 09/19/23      Clinical Impression Statement Pt refer to OT after being seen the last time in June 24 -  post bilateral mastectomy with immediate reconstruction using tissue expanders and Flex HD performed on 01/18/2023 by Dr. Orin Birk and Dr. Dana Duncan. Breast CA diagnosis on the L.  Pt report had multiple surgeries to L breast since seen OT in June- and in PT for bilateral shoulder stiffness. Patient is right-hand dominant.  Pt report and show increase swelling and lymphedema in lateral L breast, axilla and thorasic - with increase of circumference in L UE -  pt seen for follow up  today after starting wearing over the counter compression sleeve and jovipak - doing well keeping L UE circumference about same or decreased- even after procedure 2 wks ago - p to cont to wear sleeve and jovipak 6-8 hrs day for 4wks - Patient can benefit from skilled OT services to setup pt with homeprogram , appropriate pression garments and monitor to decrease lymphedema, discomfort and pain in left breast, thoracic and upper extremity.     OT Occupational Profile and History Problem Focused Assessment - Including review of records relating to presenting problem     Occupational performance deficits (Please refer to evaluation for details): ADL's;IADL's;Rest and Sleep;Social Participation;Leisure     Body Structure / Function / Physical Skills ADL;Decreased knowledge of precautions;Flexibility;ROM;UE functional use;Scar mobility;IADL;Pain;Skin integrity     Rehab Potential Good     Clinical Decision Making Several treatment options, min-mod task modification necessary     Comorbidities Affecting Occupational Performance: May have comorbidities impacting occupational performance     Modification or Assistance to Complete Evaluation  Min-Moderate modification of tasks or assist with assess necessary to complete eval     OT Frequency Monthly     OT Duration 2 weeks     OT Treatment/Interventions Self-care/ADL training;Manual lymph drainage;Therapeutic exercise;Patient/family education;DME and/or AE instruction;Manual Therapy;Scar mobilization;Therapeutic activities     Consulted and Agree with Plan of Care Patient                   Patient will benefit from skilled therapeutic intervention in order to improve the following deficits and impairments:   Body Structure / Function / Physical Skills: ADL, Decreased knowledge of precautions, Flexibility, ROM, UE functional use, Scar mobility, IADL, Pain, Skin integrity     Visit Diagnosis: Post-mastectomy lymphedema syndrome   Chest pain,  muscular       Problem List  Patient Active Problem List    Diagnosis Date Noted   Long-term current use of tamoxifen  06/26/2023   Claustrophobia     Pernicious anemia 03/14/2023   Ruptured left breast implant 02/13/2023   S/P mastectomy, bilateral 01/18/2023   Genetic testing 12/18/2022   Breast cancer (HCC) 12/08/2022   Family history of breast cancer 12/08/2022   Profound fatigue 12/08/2022   Lumbar post-laminectomy syndrome 06/02/2022   Pseudarthrosis after fusion or arthrodesis 05/19/2022   Primary osteoarthritis of right knee 03/28/2022   Subjective tinnitus of both ears 12/21/2021   Abnormality of gait 04/29/2021   Abnormal sensation in both ears 04/06/2021   Other adverse food reactions, not elsewhere classified, subsequent encounter 04/06/2021   Multiple drug allergies 04/06/2021   Insomnia due to medical condition 02/25/2021   Myofascial muscle pain 02/25/2021   Nerve pain 10/18/2020   Incomplete paraplegia (HCC) 10/18/2020   Orthostatic hypotension 10/18/2020   Renal cyst 09/23/2020   Chronic migraine without aura without status migrainosus, not intractable     Hypokalemia     Adjustment reaction with anxiety and depression     Spinal cord injury, lumbar, without spinal bone injury, sequela (HCC) 08/18/2020   Paraparesis (HCC)     Post-operative pain     Hypotension     Slow transit constipation     AKI (acute kidney injury) (HCC)     Right leg weakness 08/11/2020   DDD (degenerative disc disease), lumbar 09/02/2019      Class: Chronic   Degenerative disc disease, lumbar 09/02/2019   Chest tightness 09/23/2018   Bradycardia 09/23/2018   BMI 40.0-44.9, adult (HCC) 04/17/2018   Status post bariatric surgery 04/17/2018   Labile blood pressure 03/08/2018   Chronic low back pain 09/03/2017   History of total hip replacement, left 01/26/2016   Osteoarthritis of spine with radiculopathy, lumbar region 10/16/2014   EDEMA 04/28/2008   LEG PAIN, BILATERAL  12/16/2007   Malignant neoplasm of cervix uteri (HCC) 07/02/2007   MORBID OBESITY 07/02/2007   DEPRESSION 07/02/2007   Migraine without aura 07/02/2007   Other allergic rhinitis 07/02/2007   Asthma 07/02/2007   GERD 07/02/2007   ELEVATED BLOOD PRESSURE WITHOUT DIAGNOSIS OF HYPERTENSION 07/02/2007   Asthma 07/02/2007      Heloise Lobo, OTR/L,CLT 09/19/2023, 5:45 PM   [facility] [address], [place], [address] Phone: [phone]   Fax:  [phone]   Name: Vanessa Medina MRN: [id number] Date of Birth: 17-Jun-1971

## 2023-09-21 ENCOUNTER — Other Ambulatory Visit: Payer: Self-pay | Admitting: Physician Assistant

## 2023-09-21 ENCOUNTER — Ambulatory Visit: Payer: Medicaid Other

## 2023-09-21 DIAGNOSIS — M25512 Pain in left shoulder: Secondary | ICD-10-CM

## 2023-09-21 DIAGNOSIS — R262 Difficulty in walking, not elsewhere classified: Secondary | ICD-10-CM

## 2023-09-21 DIAGNOSIS — R2681 Unsteadiness on feet: Secondary | ICD-10-CM

## 2023-09-21 DIAGNOSIS — M5459 Other low back pain: Secondary | ICD-10-CM

## 2023-09-21 DIAGNOSIS — M546 Pain in thoracic spine: Secondary | ICD-10-CM

## 2023-09-21 DIAGNOSIS — M6281 Muscle weakness (generalized): Secondary | ICD-10-CM | POA: Diagnosis not present

## 2023-09-21 DIAGNOSIS — R0789 Other chest pain: Secondary | ICD-10-CM

## 2023-09-21 DIAGNOSIS — M25612 Stiffness of left shoulder, not elsewhere classified: Secondary | ICD-10-CM

## 2023-09-21 DIAGNOSIS — R9389 Abnormal findings on diagnostic imaging of other specified body structures: Secondary | ICD-10-CM

## 2023-09-21 DIAGNOSIS — G8929 Other chronic pain: Secondary | ICD-10-CM

## 2023-09-21 NOTE — Therapy (Signed)
OUTPATIENT PHYSICAL THERAPY SHOULDER and ORTHO TREATMENT NOTE    Patient Name: Vanessa Medina MRN: [id number] DOB:07/23/1971, 53 y.o., female Today's Date: 09/21/2023  END OF SESSION:  PT End of Session - 09/21/23 0852     Visit Number 13    Number of Visits 34    Date for PT Re-Evaluation 11/15/23    Progress Note Due on Visit 20    PT Start Time 0850    PT Stop Time 0931    PT Time Calculation (min) 41 min    Equipment Utilized During Treatment Gait belt    Activity Tolerance Patient tolerated treatment well;Patient limited by pain;Patient limited by fatigue    Behavior During Therapy [facility] for tasks assessed/performed                        Past Medical History:  Diagnosis Date   Acute nontraumatic kidney injury (HCC)    Anemia    Anxiety    Benign cyst of kidney    Breast cancer (HCC) 01/18/2023   left breast   Cervical cancer (HCC)    Chronic hypotension    Chronic kidney disease    Previous now clear   Claustrophobia    Decompression injury of spinal cord    Depression    Family history of adverse reaction to anesthesia     " my mother takes a long time time wake up"   History of dysplastic nevus 01/18/2018   right shoulder, recurrent dysplastic nevus   History of kidney stones    History of shingles    Migraine    Multiple allergies    Obesity    Osteoarthritis    left hip   Pneumonia    PONV (postoperative nausea and vomiting)    slow to wake up   Sleep apnea    does not wear CPAP   Slow to wake up after anesthesia    Stroke (HCC) 08/11/2020   spinal cord stroke, spinal cord puncture during surgery   Wears glasses    Past Surgical History:  Procedure Laterality Date   ABDOMINAL HYSTERECTOMY     APPENDECTOMY     BACK SURGERY     BILIOPANCREATIC DIVISION W DUODENAL SWITCH N/A    BREAST BIOPSY Left 12/06/2022   u/s bx,1:00 heart clip, path pending   BREAST BIOPSY Left 12/06/2022   [name] bx 2:00 ribbon clip path pending   BREAST  BIOPSY Left 12/06/2022   [name] LT BREAST BX W LOC DEV EA ADD LESION IMG BX SPEC US GUIDE 12/06/2022 [facility]   BREAST BIOPSY Left 12/06/2022   [name] LT BREAST BX W LOC DEV 1ST LESION IMG BX SPEC US GUIDE 12/06/2022 [facility]   BREAST RECONSTRUCTION WITH PLACEMENT OF TISSUE EXPANDER AND FLEX HD (ACELLULAR HYDRATED DERMIS) Bilateral 01/18/2023   Procedure: IMMEDIATE LEFT BREAST RECONSTRUCTION WITH PLACEMENT OF TISSUE EXPANDER AND FLEX HD (ACELLULAR HYDRATED DERMIS);  Surgeon: Peggye Form, DO;  Location: [facility];  Service: Plastics;  Laterality: Bilateral;   BREAST RECONSTRUCTION WITH PLACEMENT OF TISSUE EXPANDER AND FLEX HD (ACELLULAR HYDRATED DERMIS) Left 03/26/2023   Procedure: BREAST RECONSTRUCTION WITH PLACEMENT OF TISSUE EXPANDER;  Surgeon: Peggye Form, DO;  Location: [facility];  Service: Plastics;  Laterality: Left;   CHOLECYSTECTOMY     COLONOSCOPY N/A 06/27/2021   Procedure: COLONOSCOPY;  Surgeon: Jaynie Collins, DO;  Location: [facility];  Service: Gastroenterology;  Laterality: N/A;   COLONOSCOPY N/A 08/27/2023  Procedure: COLONOSCOPY;  Surgeon: Jaynie Collins, DO;  Location: [facility];  Service: Gastroenterology;  Laterality: N/A;   DIAGNOSTIC LAPAROSCOPY     LOA   ESOPHAGOGASTRODUODENOSCOPY N/A 06/27/2021   Procedure: ESOPHAGOGASTRODUODENOSCOPY (EGD);  Surgeon: Jaynie Collins, DO;  Location: [facility] ENDOSCOPY;  Service: Gastroenterology;  Laterality: N/A;   ESOPHAGOGASTRODUODENOSCOPY (EGD) WITH PROPOFOL N/A 07/25/2017   Procedure: ESOPHAGOGASTRODUODENOSCOPY (EGD) WITH PROPOFOL;  Surgeon: Scot Jun, MD;  Location: [facility] ENDOSCOPY;  Service: Endoscopy;  Laterality: N/A;   FLEXIBLE BRONCHOSCOPY     HERNIA REPAIR     KNEE ARTHROSCOPY     LAPAROSCOPIC GASTRIC SLEEVE RESECTION WITH HIATAL HERNIA REPAIR     LUMBAR FUSION  08/29/2022   Revision Lumbar two through five Laminectomy & Fusion with Transforaminal Lumbar  interbody fusion   MASTECTOMY W/ SENTINEL NODE BIOPSY Bilateral 01/18/2023   Procedure: MASTECTOMY WITH SENTINEL LYMPH NODE BIOPSY, RNFA to assist;  Surgeon: Leafy Ro, MD;  Location: [facility];  Service: General;  Laterality: Bilateral;   REMOVAL OF TISSUE EXPANDER AND PLACEMENT OF IMPLANT Bilateral 06/06/2023   Procedure: REMOVAL OF TISSUE EXPANDER AND PLACEMENT OF IMPLANT;  Surgeon: Peggye Form, DO;  Location: [facility];  Service: Plastics;  Laterality: Bilateral;   SPINAL CORD STIMULATOR IMPLANT     TONSILLECTOMY     TOTAL HIP ARTHROPLASTY Left 01/26/2016   Procedure: LEFT TOTAL HIP ARTHROPLASTY ANTERIOR APPROACH;  Surgeon: Tarry Kos, MD;  Location: [facility];  Service: Orthopedics;  Laterality: Left;   TRANSFORAMINAL LUMBAR INTERBODY FUSION (TLIF) WITH PEDICLE SCREW FIXATION 3 LEVEL  08/2019   Vira Browns, MD L2-L5   Patient Active Problem List   Diagnosis Date Noted   Long term (current) use of aromatase inhibitors 09/07/2023   At risk for loss of bone density 09/07/2023   Changing skin lesion 09/04/2023   Frequent falls 08/22/2023   Chronic pain syndrome 08/22/2023   Bilateral lower extremity edema 08/06/2023   Back pain 08/06/2023   Claustrophobia    Pernicious anemia 03/14/2023   Ruptured left breast implant 02/13/2023   S/P mastectomy, bilateral 01/18/2023   Genetic testing 12/18/2022   Breast cancer (HCC) 12/08/2022   Family history of breast cancer 12/08/2022   Profound fatigue 12/08/2022   Lumbar post-laminectomy syndrome 06/02/2022   Pseudarthrosis after fusion or arthrodesis 05/19/2022   Primary osteoarthritis of right knee 03/28/2022   Subjective tinnitus of both ears 12/21/2021   Abnormality of gait 04/29/2021   Abnormal sensation in both ears 04/06/2021   Other adverse food reactions, not elsewhere classified, subsequent encounter 04/06/2021   Multiple drug allergies 04/06/2021   Insomnia due to medical condition 02/25/2021    Myofascial muscle pain 02/25/2021   Nerve pain 10/18/2020   Incomplete paraplegia (HCC) 10/18/2020   Orthostatic hypotension 10/18/2020   Renal cyst 09/23/2020   Chronic migraine without aura without status migrainosus, not intractable    Hypokalemia    Adjustment reaction with anxiety and depression    Spinal cord injury, lumbar, without spinal bone injury, sequela (HCC) 08/18/2020   Paraparesis (HCC)    Post-operative pain    Hypotension    Slow transit constipation    AKI (acute kidney injury) (HCC)    Right leg weakness 08/11/2020   DDD (degenerative disc disease), lumbar 09/02/2019    Class: Chronic   Degenerative disc disease, lumbar 09/02/2019   Chest tightness 09/23/2018   Bradycardia 09/23/2018   BMI 40.0-44.9, adult (HCC) 04/17/2018   Status post bariatric surgery 04/17/2018  Labile blood pressure 03/08/2018   Chronic low back pain 09/03/2017   History of total hip replacement, left 01/26/2016   Osteoarthritis of spine with radiculopathy, lumbar region 10/16/2014   LEG PAIN, BILATERAL 12/16/2007   Malignant neoplasm of cervix uteri (HCC) 07/02/2007   MORBID OBESITY 07/02/2007   DEPRESSION 07/02/2007   Migraine without aura 07/02/2007   Other allergic rhinitis 07/02/2007   Asthma 07/02/2007   GERD 07/02/2007   ELEVATED BLOOD PRESSURE WITHOUT DIAGNOSIS OF HYPERTENSION 07/02/2007   Asthma 07/02/2007    PCP: [name] MD  REFERRING PROVIDER: Caroline More  REFERRING DIAG: s/p breast reconstruction  THERAPY DIAG:  Muscle weakness (generalized)  Difficulty in walking, not elsewhere classified  Unsteadiness on feet  Chronic left shoulder pain  Other low back pain  Stiffness of left shoulder, not elsewhere classified  Left shoulder pain, unspecified chronicity  Chest pain, muscular  Rationale for Evaluation and Treatment: Rehabilitation  ONSET DATE: December 07 2022  SUBJECTIVE:                                                                                                                                                                                       SUBJECTIVE STATEMENT: Patient reports hurting in her low back. Reports was restricted last visit with mobility.   Hand dominance: Right  PERTINENT HISTORY: Patient is having LUE ROM issues s/p breast cancer implant reconstruction. Patient underwent bilateral exchange of tissue expanders for implants, bilateral capsulotomies for implant repositioning and right breast capsulectomy with major repositioning of 3 x 8 cm of capsule on 06/06/2023. Patient had back surgery December 26th 2023 and then was diagnosed with breast cancer December 07, 2022. Had surgery Jan 18, 2023 for her breast cancer, had double mastectomy. Patient had two sets of expanders, first one burst, the second one kept flipping and moving. Had to have more work on L side. PMH includes: anemia, anxiety, breast cancer, CKD, claustrophobia, depression, migraines, OA, PONV, sleep apnea, stroke, lumbar fusion, mastectomy.   08/23/2023:  Pt with new orders to address back pain prior to possible surgery for [facility] stimulator. Pt with hx of multiple back surgeries with chronic pain and now worsening pain with decline in mobility. Worst pain reaches 10/10, best 4/10. Pt ideally would like to improve pain. She would also like to improve her ability to walk, reports has not been able to go for walks in a while. Was ambulating without an AD for a while but now must use SPC again. She reports ability to stand, such as to do dishes, is limited due to pain. Pt experiences radiating pain down RLE into R foot and toes.  PAIN:  Are you having pain? Yes: NPRS scale: 4/10 Pain location: L shoulder and breast Pain description: post surgical Aggravating factors: lift something  Relieving factors: none Worst: 7/10 Least: 2/10    PRECAUTIONS: Other: no lifting greater than 1/2 gallon  , pt reports LATEX ALLERGY   RED FLAGS: None   WEIGHT  BEARING RESTRICTIONS:  no more than 1/2 gallon  FALLS:  Has patient fallen in last 6 months? Yes. Number of falls 1  LIVING ENVIRONMENT: Lives with: lives with their family Lives in: House/apartment Stairs: Yes: External: 2 steps; on right going up and on left going up Has following equipment at home: Single point cane, Walker - 2 wheeled, shower chair, and Grab bars  OCCUPATION: Disability   PLOF: Independent with basic ADLs  PATIENT GOALS:to decrease pain in L shoulder;   NEXT MD VISIT:   OBJECTIVE:  Note: Objective measures were completed at Evaluation unless otherwise noted.  DIAGNOSTIC FINDINGS:  N/a   PATIENT SURVEYS:  FOTO 32  COGNITION: Overall cognitive status:  some short term memory      SENSATION: WFL  POSTURE: Rounding shoulders,   UPPER EXTREMITY ROM:   Active ROM Right eval Left eval  Shoulder flexion 115* 90  Shoulder extension 48 41  Shoulder abduction 81* 89  Shoulder adduction    Shoulder internal rotation    Shoulder external rotation Unable to test in testing position Unable to test in testing position  (Blank rows = not tested) *  UPPER EXTREMITY MMT:   LE MMT: 08/23/23 Gross BLE strength is 4-/5, pain-limited RLE   SHOULDER SPECIAL TESTS: Deferred due to limited ROM   JOINT MOBILITY TESTING:  GH: hypomobile AP, PA, inferior glide bilateral   PALPATION:  Muscle guarding subscap, pectoral, glenohumeral region  Significant fluid on L flank    TODAY'S TREATMENT:                                                                                                                                         DATE: 09/21/23  TherEx: (all seated today)    TrA activation 10x 3 second holds  TrA activation with UE raises 10x each side TrA activation with Dynamic hip march x 10 reps each LE TrA activation with Seated Dying bug (Alt opp UE/LE raises) x 10 reps each TrA activation with seated hip mobility- lifting LE up over 1/2 spike ball x  10 reps each LE. TrA activation with STS from raised surface height- edge of mat- VC to keep TrA activated. Patient denied any pain x 15 reps  Seated lumbar flex - roll theraball out- nice and gentle- patient controlled speed and amount of flex - approx 30 reps  Seated lumbar flex - bias to left - ball rollout - 1 min Seated lumbar flex- bias to right- ball rollout- 1 min (increased tightness versus going to left)   Seated Hamstring  stretch- hold 20-30 sec x 3 each LE Seated figure 4 - hold 20-30 sec x 3 each LE Seated piriformis - hold 20-30 sec x 3 each LE    PATIENT EDUCATION: Education details: goals, plan Person educated: Patient Education method: Explanation, Demonstration, Tactile cues, and Verbal cues Education comprehension: verbalized understanding, returned demonstration, verbal cues required, and tactile cues required  HOME EXERCISE PROGRAM:    PREV Access Code: [email or web address] URL: [email or web address] Date: 08/16/2023 Prepared by: Temple Pacini  Exercises - Standing Shoulder External Rotation with Resistance  - 1 x daily - 5-6 x weekly - 3 sets - 10 reps - Supine Alternating Shoulder Flexion  - 1 x daily - 5-6 x weekly - 3 sets - 10 reps - Seated Shoulder Row with Anchored Resistance  - 1 x daily - 5-6 x weekly - 3 sets - 10 reps   12/2: Access Code: [email or web address] URL: [email or web address] Date: 08/06/2023 Prepared by: Temple Pacini  Exercises - Seated Shoulder Flexion Towel Slide at Table Top  - 1 x daily - 5-7 x weekly - 2 sets - 10 reps - 15-30 sec hold - Seated Shoulder Abduction Towel Slide at Table Top  - 1 x daily - 5-7 x weekly - 2 sets - 10 reps - 15-30 sec hold  11/26: updated Access Code: [email or web address] URL: [email or web address] Date: 07/31/2023 Prepared by: Temple Pacini  Exercises - Isometric Shoulder Flexion at Wall  - 1 x daily - 7 x weekly - 2 sets - 10 reps - Isometric Shoulder Extension at Wall  - 1 x daily - 7 x  weekly - 2 sets - 10 reps - Standing Isometric Shoulder Internal Rotation at Doorway  - 1 x daily - 7 x weekly - 2 sets - 10 reps - Standing Isometric Shoulder External Rotation with Doorway  - 1 x daily - 7 x weekly - 2 sets - 10 reps  Access Code: [email or web address] URL: [email or web address] Date: 07/02/2023 Prepared by: Temple Pacini  Exercises - Seated Shoulder Shrugs  - 2 x daily - 6-7 x weekly - 3 sets - 10 reps - Seated Shoulder Shrug Circles AROM Backward  - 2 x daily - 6-7 x weekly - 3 sets - 10 reps - Seated Scapular Retraction  - 1 x daily - 6-7 x weekly - 3 sets - 10 reps  ASSESSMENT:  CLINICAL IMPRESSION: Patient presents with good motivation and able to perform more active ROM and later core exercises. She responded well to seated mobility exercises and able to "loosen up" some and then progress to more core activities- all without report of increased Low back pain today and no report of spasms. Patient reports felt like a good workout and able to complete all tasks today.  Patient will benefit from skilled physical therapy to improve ROM and strength and decrease pain for improved quality of life.     OBJECTIVE IMPAIRMENTS: Abnormal gait, decreased endurance, decreased mobility, decreased ROM, decreased strength, hypomobility, increased edema, increased fascial restrictions, impaired perceived functional ability, increased muscle spasms, impaired flexibility, impaired UE functional use, improper body mechanics, postural dysfunction, and pain.   ACTIVITY LIMITATIONS: carrying, lifting, bed mobility, bathing, toileting, dressing, self feeding, reach over head, hygiene/grooming, and caring for others  PARTICIPATION LIMITATIONS: meal prep, cleaning, laundry, medication management, shopping, community activity, and yard work  PERSONAL FACTORS: Age, Fitness, Past/current experiences, Time since onset of injury/illness/exacerbation, and 3+ comorbidities: anemia, anxiety, breast  cancer, CKD, claustrophobia, depression, migraines, OA, PONV, sleep apnea, stroke, lumbar fusion, mastectomy.  are also affecting patient's functional outcome.   REHAB POTENTIAL: Good  CLINICAL DECISION MAKING: Evolving/moderate complexity  EVALUATION COMPLEXITY: Moderate   GOALS: Goals reviewed with patient? Yes  SHORT TERM GOALS: Target date: 10/04/2023      Patient will be independent in home exercise program to improve strength/mobility for better functional independence with ADLs. Baseline: 10/23: give next session ;12/19: pt indep Goal status: MET    LONG TERM GOALS: Target date: 11/15/2023    Patient will increase FOTO score to equal to or greater than 49    to demonstrate statistically significant improvement in mobility and quality of life.  Baseline: 32; 08/23/2023: 31 Goal status: INITIAL  2.  Patient will report a worst pain of 3/10 on VAS in L shoulder  to improve tolerance with ADLs and reduced symptoms with activities.  Baseline: 7/10; 12/19: pt reports worst pain is a 6/10, felt in the evenings Goal status: ONGOING  3.  Patient will improve shoulder AROM to > 140 degrees of flexion, scaption, and abduction for improved ability to perform overhead activities. Baseline:12/19: flexion 160 deg; abduction ~140 pain limited Goal status: PARTIALLY MET  4.  Patient will improve shoulder strength to 4/5 for improved functional ADL and iADL performance Baseline: 16/10: unable to perform today due to pain and ROM limitations;12/19: grossly 4-/5  Goal status: ONGOING  5. Patient will increase six minute walk test distance to >1000 for progression to community ambulator and improve gait ability  Baseline: 652 ft with SPC; 12/19: 391 ft with SPC  Goal status: INITIAL  6. Patient will reduce modified Oswestry score to <50 as to demonstrate decreased disability with ADLs including improved sleeping tolerance, walking/sitting tolerance etc for better mobility with ADLs.   Baseline: 78% Goal status: NEW   7. Patient will increase BLE gross strength to 4+/5 as to improve functional strength for independent gait, increased standing tolerance and increased ADL ability. Baseline:Gross BLE strength is 4-/5, pain-limited RLE  Goal status: NEW   PLAN:  PT FREQUENCY: 2x/week  PT DURATION: 12 weeks  PLANNED INTERVENTIONS: 97164- PT Re-evaluation, 97110-Therapeutic exercises, 97530- Therapeutic activity, 97112- Neuromuscular re-education, 97535- Self Care, 96045- Manual therapy, 97116- Gait training, 97014- Electrical stimulation (unattended), [phone]- Electrical stimulation (manual), [phone]- Traction (mechanical), Patient/Family education, Balance training, Taping, Joint mobilization, Spinal mobilization, Manual lymph drainage, Scar mobilization, Compression bandaging, Vestibular training, Visual/preceptual remediation/compensation, Cognitive remediation, Cryotherapy, Moist heat, and Biofeedback  PLAN FOR NEXT SESSION:  ROM Of shoulder, isometrics, progress HEP as pt able, endurance, LE strengthening if time permits   Lenda Kelp, PT 09/21/2023, 10:01 AM

## 2023-09-21 NOTE — Progress Notes (Signed)
IMPRESSION: 1. No evidence of high-grade spinal canal or neural foraminal stenosis. 2. Narrowing of the dorsal epidural space at the T8-T11 levels secondary to ligamentum flavum hypertrophy. 3. Minimal T2 hyperintense signal abnormality and contrast enhancement in the left lamina at T6. While this finding is most likely degenerative in nature, a follow-up thoracic spine MRI in 1-2 months is recommended to assess for interval change and exclude the possibility of malignancy    Spoke with patient over the phone regarding results. Plan for repeat MRI in 6 weeks.

## 2023-09-24 NOTE — Therapy (Signed)
OUTPATIENT PHYSICAL THERAPY SHOULDER and ORTHO TREATMENT NOTE    Patient Name: Vanessa Medina MRN: [id number] DOB:Oct 28, 1970, 53 y.o., female Today's Date: 09/25/2023  END OF SESSION:  PT End of Session - 09/25/23 1258     Visit Number 14    Number of Visits 34    Date for PT Re-Evaluation 11/15/23    Progress Note Due on Visit 20    PT Start Time 1314    PT Stop Time 1359    PT Time Calculation (min) 45 min    Equipment Utilized During Treatment Gait belt    Activity Tolerance Patient tolerated treatment well;Patient limited by pain;Patient limited by fatigue    Behavior During Therapy [facility] for tasks assessed/performed                         Past Medical History:  Diagnosis Date   Acute nontraumatic kidney injury (HCC)    Anemia    Anxiety    Benign cyst of kidney    Breast cancer (HCC) 01/18/2023   left breast   Cervical cancer (HCC)    Chronic hypotension    Chronic kidney disease    Previous now clear   Claustrophobia    Decompression injury of spinal cord    Depression    Family history of adverse reaction to anesthesia     " my mother takes a long time time wake up"   History of dysplastic nevus 01/18/2018   right shoulder, recurrent dysplastic nevus   History of kidney stones    History of shingles    Migraine    Multiple allergies    Obesity    Osteoarthritis    left hip   Pneumonia    PONV (postoperative nausea and vomiting)    slow to wake up   Sleep apnea    does not wear CPAP   Slow to wake up after anesthesia    Stroke (HCC) 08/11/2020   spinal cord stroke, spinal cord puncture during surgery   Wears glasses    Past Surgical History:  Procedure Laterality Date   ABDOMINAL HYSTERECTOMY     APPENDECTOMY     BACK SURGERY     BILIOPANCREATIC DIVISION W DUODENAL SWITCH N/A    BREAST BIOPSY Left 12/06/2022   u/s bx,1:00 heart clip, path pending   BREAST BIOPSY Left 12/06/2022   [name] bx 2:00 ribbon clip path pending   BREAST  BIOPSY Left 12/06/2022   [name] LT BREAST BX W LOC DEV EA ADD LESION IMG BX SPEC US GUIDE 12/06/2022 [facility]   BREAST BIOPSY Left 12/06/2022   [name] LT BREAST BX W LOC DEV 1ST LESION IMG BX SPEC US GUIDE 12/06/2022 [facility]   BREAST RECONSTRUCTION WITH PLACEMENT OF TISSUE EXPANDER AND FLEX HD (ACELLULAR HYDRATED DERMIS) Bilateral 01/18/2023   Procedure: IMMEDIATE LEFT BREAST RECONSTRUCTION WITH PLACEMENT OF TISSUE EXPANDER AND FLEX HD (ACELLULAR HYDRATED DERMIS);  Surgeon: Peggye Form, DO;  Location: [facility];  Service: Plastics;  Laterality: Bilateral;   BREAST RECONSTRUCTION WITH PLACEMENT OF TISSUE EXPANDER AND FLEX HD (ACELLULAR HYDRATED DERMIS) Left 03/26/2023   Procedure: BREAST RECONSTRUCTION WITH PLACEMENT OF TISSUE EXPANDER;  Surgeon: Peggye Form, DO;  Location: [facility];  Service: Plastics;  Laterality: Left;   CHOLECYSTECTOMY     COLONOSCOPY N/A 06/27/2021   Procedure: COLONOSCOPY;  Surgeon: Jaynie Collins, DO;  Location: [facility];  Service: Gastroenterology;  Laterality: N/A;   COLONOSCOPY N/A 08/27/2023  Procedure: COLONOSCOPY;  Surgeon: Jaynie Collins, DO;  Location: [facility] ENDOSCOPY;  Service: Gastroenterology;  Laterality: N/A;   DIAGNOSTIC LAPAROSCOPY     LOA   ESOPHAGOGASTRODUODENOSCOPY N/A 06/27/2021   Procedure: ESOPHAGOGASTRODUODENOSCOPY (EGD);  Surgeon: Jaynie Collins, DO;  Location: [facility] ENDOSCOPY;  Service: Gastroenterology;  Laterality: N/A;   ESOPHAGOGASTRODUODENOSCOPY (EGD) WITH PROPOFOL N/A 07/25/2017   Procedure: ESOPHAGOGASTRODUODENOSCOPY (EGD) WITH PROPOFOL;  Surgeon: Scot Jun, MD;  Location: [facility] ENDOSCOPY;  Service: Endoscopy;  Laterality: N/A;   FLEXIBLE BRONCHOSCOPY     HERNIA REPAIR     KNEE ARTHROSCOPY     LAPAROSCOPIC GASTRIC SLEEVE RESECTION WITH HIATAL HERNIA REPAIR     LUMBAR FUSION  08/29/2022   Revision Lumbar two through five Laminectomy & Fusion with Transforaminal Lumbar  interbody fusion   MASTECTOMY W/ SENTINEL NODE BIOPSY Bilateral 01/18/2023   Procedure: MASTECTOMY WITH SENTINEL LYMPH NODE BIOPSY, RNFA to assist;  Surgeon: Leafy Ro, MD;  Location: [facility];  Service: General;  Laterality: Bilateral;   REMOVAL OF TISSUE EXPANDER AND PLACEMENT OF IMPLANT Bilateral 06/06/2023   Procedure: REMOVAL OF TISSUE EXPANDER AND PLACEMENT OF IMPLANT;  Surgeon: Peggye Form, DO;  Location:  SURGERY CENTER;  Service: Plastics;  Laterality: Bilateral;   SPINAL CORD STIMULATOR IMPLANT     TONSILLECTOMY     TOTAL HIP ARTHROPLASTY Left 01/26/2016   Procedure: LEFT TOTAL HIP ARTHROPLASTY ANTERIOR APPROACH;  Surgeon: Tarry Kos, MD;  Location: [facility];  Service: Orthopedics;  Laterality: Left;   TRANSFORAMINAL LUMBAR INTERBODY FUSION (TLIF) WITH PEDICLE SCREW FIXATION 3 LEVEL  08/2019   Vira Browns, MD L2-L5   Patient Active Problem List   Diagnosis Date Noted   Long term (current) use of aromatase inhibitors 09/07/2023   At risk for loss of bone density 09/07/2023   Changing skin lesion 09/04/2023   Frequent falls 08/22/2023   Chronic pain syndrome 08/22/2023   Bilateral lower extremity edema 08/06/2023   Back pain 08/06/2023   Claustrophobia    Pernicious anemia 03/14/2023   Ruptured left breast implant 02/13/2023   S/P mastectomy, bilateral 01/18/2023   Genetic testing 12/18/2022   Breast cancer (HCC) 12/08/2022   Family history of breast cancer 12/08/2022   Profound fatigue 12/08/2022   Lumbar post-laminectomy syndrome 06/02/2022   Pseudarthrosis after fusion or arthrodesis 05/19/2022   Primary osteoarthritis of right knee 03/28/2022   Subjective tinnitus of both ears 12/21/2021   Abnormality of gait 04/29/2021   Abnormal sensation in both ears 04/06/2021   Other adverse food reactions, not elsewhere classified, subsequent encounter 04/06/2021   Multiple drug allergies 04/06/2021   Insomnia due to medical condition 02/25/2021    Myofascial muscle pain 02/25/2021   Nerve pain 10/18/2020   Incomplete paraplegia (HCC) 10/18/2020   Orthostatic hypotension 10/18/2020   Renal cyst 09/23/2020   Chronic migraine without aura without status migrainosus, not intractable    Hypokalemia    Adjustment reaction with anxiety and depression    Spinal cord injury, lumbar, without spinal bone injury, sequela (HCC) 08/18/2020   Paraparesis (HCC)    Post-operative pain    Hypotension    Slow transit constipation    AKI (acute kidney injury) (HCC)    Right leg weakness 08/11/2020   DDD (degenerative disc disease), lumbar 09/02/2019    Class: Chronic   Degenerative disc disease, lumbar 09/02/2019   Chest tightness 09/23/2018   Bradycardia 09/23/2018   BMI 40.0-44.9, adult (HCC) 04/17/2018   Status post bariatric surgery 04/17/2018  Labile blood pressure 03/08/2018   Chronic low back pain 09/03/2017   History of total hip replacement, left 01/26/2016   Osteoarthritis of spine with radiculopathy, lumbar region 10/16/2014   LEG PAIN, BILATERAL 12/16/2007   Malignant neoplasm of cervix uteri (HCC) 07/02/2007   MORBID OBESITY 07/02/2007   DEPRESSION 07/02/2007   Migraine without aura 07/02/2007   Other allergic rhinitis 07/02/2007   Asthma 07/02/2007   GERD 07/02/2007   ELEVATED BLOOD PRESSURE WITHOUT DIAGNOSIS OF HYPERTENSION 07/02/2007   Asthma 07/02/2007    PCP: [name] MD  REFERRING PROVIDER: Caroline More  REFERRING DIAG: s/p breast reconstruction  THERAPY DIAG:  Muscle weakness (generalized)  Difficulty in walking, not elsewhere classified  Unsteadiness on feet  Chronic left shoulder pain  Rationale for Evaluation and Treatment: Rehabilitation  ONSET DATE: December 07 2022  SUBJECTIVE:                                                                                                                                                                                      SUBJECTIVE STATEMENT: Patient  reports 8/10 pain in low back. Was called to schedule an MRI for her low back.   Hand dominance: Right  PERTINENT HISTORY: Patient is having LUE ROM issues s/p breast cancer implant reconstruction. Patient underwent bilateral exchange of tissue expanders for implants, bilateral capsulotomies for implant repositioning and right breast capsulectomy with major repositioning of 3 x 8 cm of capsule on 06/06/2023. Patient had back surgery December 26th 2023 and then was diagnosed with breast cancer December 07, 2022. Had surgery Jan 18, 2023 for her breast cancer, had double mastectomy. Patient had two sets of expanders, first one burst, the second one kept flipping and moving. Had to have more work on L side. PMH includes: anemia, anxiety, breast cancer, CKD, claustrophobia, depression, migraines, OA, PONV, sleep apnea, stroke, lumbar fusion, mastectomy.   08/23/2023:  Pt with new orders to address back pain prior to possible surgery for [place] stimulator. Pt with hx of multiple back surgeries with chronic pain and now worsening pain with decline in mobility. Worst pain reaches 10/10, best 4/10. Pt ideally would like to improve pain. She would also like to improve her ability to walk, reports has not been able to go for walks in a while. Was ambulating without an AD for a while but now must use SPC again. She reports ability to stand, such as to do dishes, is limited due to pain. Pt experiences radiating pain down RLE into R foot and toes.     PAIN:  Are you having pain? Yes: NPRS scale: 4/10 Pain location: L shoulder and breast Pain description: post  surgical Aggravating factors: lift something  Relieving factors: none Worst: 7/10 Least: 2/10    PRECAUTIONS: Other: no lifting greater than 1/2 gallon  , pt reports LATEX ALLERGY   RED FLAGS: None   WEIGHT BEARING RESTRICTIONS:  no more than 1/2 gallon  FALLS:  Has patient fallen in last 6 months? Yes. Number of falls 1  LIVING ENVIRONMENT: Lives  with: lives with their family Lives in: House/apartment Stairs: Yes: External: 2 steps; on right going up and on left going up Has following equipment at home: Single point cane, Walker - 2 wheeled, shower chair, and Grab bars  OCCUPATION: Disability   PLOF: Independent with basic ADLs  PATIENT GOALS:to decrease pain in L shoulder;   NEXT MD VISIT:   OBJECTIVE:  Note: Objective measures were completed at Evaluation unless otherwise noted.  DIAGNOSTIC FINDINGS:  N/a   PATIENT SURVEYS:  FOTO 32  COGNITION: Overall cognitive status:  some short term memory      SENSATION: WFL  POSTURE: Rounding shoulders,   UPPER EXTREMITY ROM:   Active ROM Right eval Left eval  Shoulder flexion 115* 90  Shoulder extension 48 41  Shoulder abduction 81* 89  Shoulder adduction    Shoulder internal rotation    Shoulder external rotation Unable to test in testing position Unable to test in testing position  (Blank rows = not tested) *  UPPER EXTREMITY MMT:   LE MMT: 08/23/23 Gross BLE strength is 4-/5, pain-limited RLE   SHOULDER SPECIAL TESTS: Deferred due to limited ROM   JOINT MOBILITY TESTING:  GH: hypomobile AP, PA, inferior glide bilateral   PALPATION:  Muscle guarding subscap, pectoral, glenohumeral region  Significant fluid on L flank    TODAY'S TREATMENT:                                                                                                                                         DATE: 09/25/23  TherEx: Supine:  LE rotation 20x supine Hamstring stretch- hold 60 seconds each LE  supine figure 4 - hold 60 seconds each side Supine single knee to chest 60 seconds each side Posterior pelvic tilt 15x 3 second holds Knee at 45 degrees, use deep core to return to center 10x; each side Supine hamstring isometric 10x; 3 second holds; 2 sets GTB abduction 15x  GTB march -terminated due to pain  Adduction ball squeeze 15x   Standing TrA with swiss ball  marches 10x  Seated  RTB hamstring curl 10x each LE  RTB df 15x  Step over theraband x10 each side   PATIENT EDUCATION: Education details: goals, plan Person educated: Patient Education method: Explanation, Demonstration, Tactile cues, and Verbal cues Education comprehension: verbalized understanding, returned demonstration, verbal cues required, and tactile cues required  HOME EXERCISE PROGRAM:    PREV Access Code: [email or web address] URL: [email or web address] Date: 08/16/2023 Prepared by: Temple Pacini  Exercises - Standing  Shoulder External Rotation with Resistance  - 1 x daily - 5-6 x weekly - 3 sets - 10 reps - Supine Alternating Shoulder Flexion  - 1 x daily - 5-6 x weekly - 3 sets - 10 reps - Seated Shoulder Row with Anchored Resistance  - 1 x daily - 5-6 x weekly - 3 sets - 10 reps   12/2: Access Code: [email or web address] URL: [email or web address] Date: 08/06/2023 Prepared by: Temple Pacini  Exercises - Seated Shoulder Flexion Towel Slide at Table Top  - 1 x daily - 5-7 x weekly - 2 sets - 10 reps - 15-30 sec hold - Seated Shoulder Abduction Towel Slide at Table Top  - 1 x daily - 5-7 x weekly - 2 sets - 10 reps - 15-30 sec hold  11/26: updated Access Code: [email or web address] URL: [email or web address] Date: 07/31/2023 Prepared by: Temple Pacini  Exercises - Isometric Shoulder Flexion at Wall  - 1 x daily - 7 x weekly - 2 sets - 10 reps - Isometric Shoulder Extension at Wall  - 1 x daily - 7 x weekly - 2 sets - 10 reps - Standing Isometric Shoulder Internal Rotation at Doorway  - 1 x daily - 7 x weekly - 2 sets - 10 reps - Standing Isometric Shoulder External Rotation with Doorway  - 1 x daily - 7 x weekly - 2 sets - 10 reps  Access Code: [email or web address] URL: [email or web address] Date: 07/02/2023 Prepared by: Temple Pacini  Exercises - Seated Shoulder Shrugs  - 2 x daily - 6-7 x weekly - 3 sets - 10 reps - Seated Shoulder Shrug Circles AROM  Backward  - 2 x daily - 6-7 x weekly - 3 sets - 10 reps - Seated Scapular Retraction  - 1 x daily - 6-7 x weekly - 3 sets - 10 reps  ASSESSMENT:  CLINICAL IMPRESSION: Patient will need follow up MRI due to potential mass found on spine per patient report. Patient tolerates supine core stabilization with minimal pain.   Patient will benefit from skilled physical therapy to improve ROM and strength and decrease pain for improved quality of life.     OBJECTIVE IMPAIRMENTS: Abnormal gait, decreased endurance, decreased mobility, decreased ROM, decreased strength, hypomobility, increased edema, increased fascial restrictions, impaired perceived functional ability, increased muscle spasms, impaired flexibility, impaired UE functional use, improper body mechanics, postural dysfunction, and pain.   ACTIVITY LIMITATIONS: carrying, lifting, bed mobility, bathing, toileting, dressing, self feeding, reach over head, hygiene/grooming, and caring for others  PARTICIPATION LIMITATIONS: meal prep, cleaning, laundry, medication management, shopping, community activity, and yard work  PERSONAL FACTORS: Age, Fitness, Past/current experiences, Time since onset of injury/illness/exacerbation, and 3+ comorbidities: anemia, anxiety, breast cancer, CKD, claustrophobia, depression, migraines, OA, PONV, sleep apnea, stroke, lumbar fusion, mastectomy.   are also affecting patient's functional outcome.   REHAB POTENTIAL: Good  CLINICAL DECISION MAKING: Evolving/moderate complexity  EVALUATION COMPLEXITY: Moderate   GOALS: Goals reviewed with patient? Yes  SHORT TERM GOALS: Target date: 10/04/2023      Patient will be independent in home exercise program to improve strength/mobility for better functional independence with ADLs. Baseline: 10/23: give next session ;12/19: pt indep Goal status: MET    LONG TERM GOALS: Target date: 11/15/2023    Patient will increase FOTO score to equal to or greater than  49    to demonstrate statistically significant improvement in mobility and quality of life.  Baseline: 32; 08/23/2023: 31 Goal status: INITIAL  2.  Patient will report a worst pain  of 3/10 on VAS in L shoulder  to improve tolerance with ADLs and reduced symptoms with activities.  Baseline: 7/10; 12/19: pt reports worst pain is a 6/10, felt in the evenings Goal status: ONGOING  3.  Patient will improve shoulder AROM to > 140 degrees of flexion, scaption, and abduction for improved ability to perform overhead activities. Baseline:12/19: flexion 160 deg; abduction ~140 pain limited Goal status: PARTIALLY MET  4.  Patient will improve shoulder strength to 4/5 for improved functional ADL and iADL performance Baseline: 16/10: unable to perform today due to pain and ROM limitations;12/19: grossly 4-/5  Goal status: ONGOING  5. Patient will increase six minute walk test distance to >1000 for progression to community ambulator and improve gait ability  Baseline: 652 ft with SPC; 12/19: 391 ft with SPC  Goal status: INITIAL  6. Patient will reduce modified Oswestry score to <50 as to demonstrate decreased disability with ADLs including improved sleeping tolerance, walking/sitting tolerance etc for better mobility with ADLs.  Baseline: 78% Goal status: NEW   7. Patient will increase BLE gross strength to 4+/5 as to improve functional strength for independent gait, increased standing tolerance and increased ADL ability. Baseline:Gross BLE strength is 4-/5, pain-limited RLE  Goal status: NEW   PLAN:  PT FREQUENCY: 2x/week  PT DURATION: 12 weeks  PLANNED INTERVENTIONS: 97164- PT Re-evaluation, 97110-Therapeutic exercises, 97530- Therapeutic activity, 97112- Neuromuscular re-education, 97535- Self Care, 96045- Manual therapy, 97116- Gait training, 97014- Electrical stimulation (unattended), [phone]- Electrical stimulation (manual), [phone]- Traction (mechanical), Patient/Family education, Balance  training, Taping, Joint mobilization, Spinal mobilization, Manual lymph drainage, Scar mobilization, Compression bandaging, Vestibular training, Visual/preceptual remediation/compensation, Cognitive remediation, Cryotherapy, Moist heat, and Biofeedback  PLAN FOR NEXT SESSION:  ROM Of shoulder, isometrics, progress HEP as pt able, endurance, LE strengthening if time permits   Precious Bard, PT 09/25/2023, 1:59 PM

## 2023-09-25 ENCOUNTER — Ambulatory Visit: Payer: Medicaid Other

## 2023-09-25 DIAGNOSIS — M6281 Muscle weakness (generalized): Secondary | ICD-10-CM | POA: Diagnosis not present

## 2023-09-25 DIAGNOSIS — R2681 Unsteadiness on feet: Secondary | ICD-10-CM

## 2023-09-25 DIAGNOSIS — I890 Lymphedema, not elsewhere classified: Secondary | ICD-10-CM | POA: Insufficient documentation

## 2023-09-25 DIAGNOSIS — G8929 Other chronic pain: Secondary | ICD-10-CM

## 2023-09-25 DIAGNOSIS — M79669 Pain in unspecified lower leg: Secondary | ICD-10-CM | POA: Insufficient documentation

## 2023-09-25 DIAGNOSIS — R262 Difficulty in walking, not elsewhere classified: Secondary | ICD-10-CM

## 2023-09-25 NOTE — Progress Notes (Unsigned)
MRN : [id number]  Vanessa Medina is a 53 y.o. ([birth year]) female who presents with chief complaint of legs swell.  History of Present Illness:   Patient is seen for evaluation of leg pain and leg swelling. The patient first noticed the swelling remotely. The swelling is associated with pain and discoloration. The pain and swelling worsens with prolonged dependency and improves with elevation. The pain is unrelated to activity.  The patient notes that in the morning the legs are significantly improved but they steadily worsened throughout the course of the day. The patient also notes a steady worsening of the discoloration in the ankle and shin area.   The patient denies claudication symptoms.  The patient denies symptoms consistent with rest pain.  The patient denies and extensive history of DJD and LS spine disease.  The patient has no had any past angiography, interventions or vascular surgery.  Elevation makes the leg symptoms better, dependency makes them much worse. There is no history of ulcerations. The patient denies any recent changes in medications.  The patient has not been wearing graduated compression.  The patient denies a history of DVT or PE. There is no prior history of phlebitis. There is no history of primary lymphedema.  No history of malignancies. No history of trauma or groin or pelvic surgery. There is no history of radiation treatment to the groin or pelvis  The patient denies amaurosis fugax or recent TIA symptoms. There are no recent neurological changes noted. The patient denies recent episodes of angina or shortness of breath  No outpatient medications have been marked as taking for the 09/27/23 encounter (Appointment) with Gilda Crease, Latina Craver, MD.    Past Medical History:  Diagnosis Date   Acute nontraumatic kidney injury (HCC)    Anemia    Anxiety    Benign cyst of kidney    Breast cancer (HCC) 01/18/2023   left breast    Cervical cancer (HCC)    Chronic hypotension    Chronic kidney disease    Previous now clear   Claustrophobia    Decompression injury of spinal cord    Depression    Family history of adverse reaction to anesthesia     " my mother takes a long time time wake up"   History of dysplastic nevus 01/18/2018   right shoulder, recurrent dysplastic nevus   History of kidney stones    History of shingles    Migraine    Multiple allergies    Obesity    Osteoarthritis    left hip   Pneumonia    PONV (postoperative nausea and vomiting)    slow to wake up   Sleep apnea    does not wear CPAP   Slow to wake up after anesthesia    Stroke (HCC) 08/11/2020   spinal cord stroke, spinal cord puncture during surgery   Wears glasses     Past Surgical History:  Procedure Laterality Date   ABDOMINAL HYSTERECTOMY     APPENDECTOMY     BACK SURGERY     BILIOPANCREATIC DIVISION W DUODENAL SWITCH N/A    BREAST BIOPSY Left 12/06/2022   u/s bx,1:00 heart clip, path pending   BREAST BIOPSY Left 12/06/2022   [name] bx 2:00 ribbon clip path pending   BREAST BIOPSY Left 12/06/2022   [name] LT BREAST  BX W LOC DEV EA ADD LESION IMG BX SPEC US GUIDE 12/06/2022 [facility]   BREAST BIOPSY Left 12/06/2022   [name] LT BREAST BX W LOC DEV 1ST LESION IMG BX SPEC US GUIDE 12/06/2022 [facility]   BREAST RECONSTRUCTION WITH PLACEMENT OF TISSUE EXPANDER AND FLEX HD (ACELLULAR HYDRATED DERMIS) Bilateral 01/18/2023   Procedure: IMMEDIATE LEFT BREAST RECONSTRUCTION WITH PLACEMENT OF TISSUE EXPANDER AND FLEX HD (ACELLULAR HYDRATED DERMIS);  Surgeon: Peggye Form, DO;  Location: [facility];  Service: Plastics;  Laterality: Bilateral;   BREAST RECONSTRUCTION WITH PLACEMENT OF TISSUE EXPANDER AND FLEX HD (ACELLULAR HYDRATED DERMIS) Left 03/26/2023   Procedure: BREAST RECONSTRUCTION WITH PLACEMENT OF TISSUE EXPANDER;  Surgeon: Peggye Form, DO;  Location: [facility];  Service: Plastics;   Laterality: Left;   CHOLECYSTECTOMY     COLONOSCOPY N/A 06/27/2021   Procedure: COLONOSCOPY;  Surgeon: Jaynie Collins, DO;  Location: [facility];  Service: Gastroenterology;  Laterality: N/A;   COLONOSCOPY N/A 08/27/2023   Procedure: COLONOSCOPY;  Surgeon: Jaynie Collins, DO;  Location: [facility];  Service: Gastroenterology;  Laterality: N/A;   DIAGNOSTIC LAPAROSCOPY     LOA   ESOPHAGOGASTRODUODENOSCOPY N/A 06/27/2021   Procedure: ESOPHAGOGASTRODUODENOSCOPY (EGD);  Surgeon: Jaynie Collins, DO;  Location: [facility];  Service: Gastroenterology;  Laterality: N/A;   ESOPHAGOGASTRODUODENOSCOPY (EGD) WITH PROPOFOL N/A 07/25/2017   Procedure: ESOPHAGOGASTRODUODENOSCOPY (EGD) WITH PROPOFOL;  Surgeon: Scot Jun, MD;  Location: [facility];  Service: Endoscopy;  Laterality: N/A;   FLEXIBLE BRONCHOSCOPY     HERNIA REPAIR     KNEE ARTHROSCOPY     LAPAROSCOPIC GASTRIC SLEEVE RESECTION WITH HIATAL HERNIA REPAIR     LUMBAR FUSION  08/29/2022   Revision Lumbar two through five Laminectomy & Fusion with Transforaminal Lumbar interbody fusion   MASTECTOMY W/ SENTINEL NODE BIOPSY Bilateral 01/18/2023   Procedure: MASTECTOMY WITH SENTINEL LYMPH NODE BIOPSY, RNFA to assist;  Surgeon: Leafy Ro, MD;  Location: [facility];  Service: General;  Laterality: Bilateral;   REMOVAL OF TISSUE EXPANDER AND PLACEMENT OF IMPLANT Bilateral 06/06/2023   Procedure: REMOVAL OF TISSUE EXPANDER AND PLACEMENT OF IMPLANT;  Surgeon: Peggye Form, DO;  Location:  SURGERY CENTER;  Service: Plastics;  Laterality: Bilateral;   SPINAL CORD STIMULATOR IMPLANT     TONSILLECTOMY     TOTAL HIP ARTHROPLASTY Left 01/26/2016   Procedure: LEFT TOTAL HIP ARTHROPLASTY ANTERIOR APPROACH;  Surgeon: Tarry Kos, MD;  Location: [facility];  Service: Orthopedics;  Laterality: Left;   TRANSFORAMINAL LUMBAR INTERBODY FUSION (TLIF) WITH PEDICLE SCREW FIXATION 3 LEVEL  08/2019   Vira Browns,  MD L2-L5    Social History Social History   Tobacco Use   Smoking status: Never    Passive exposure: Never   Smokeless tobacco: Never  Vaping Use   Vaping status: Never Used  Substance Use Topics   Alcohol use: Not Currently    Comment: occasional wine   Drug use: No    Family History Family History  Problem Relation Age of Onset   Renal Disease Mother    Hypertension Mother    Sudden Cardiac Death Mother    Heart failure Mother    Valvular heart disease Mother    Heart disease Mother    Breast cancer Maternal Grandmother    Stroke Brother    Heart attack Brother 46   Diabetes Other     Allergies  Allergen Reactions   Bee Venom Swelling   Iodinated Contrast Media Anaphylaxis  Swelling  Other reaction(s): Other (See Comments)  Swelling   Swelling   Latex Hives and Rash   Morphine Other (See Comments)    BRADYCARDIA   Shellfish Allergy Anaphylaxis   Codeine Nausea And Vomiting   Meperidine Hcl Other (See Comments)    BRADYCARDIA   Bee Pollen Other (See Comments)    Unknown   Oxycodone Hives and Itching    Blisters on back   Tape Hives    Adhesive   Pentazocine Lactate Nausea And Vomiting    REACTION: vomiting with Talwin NX   Propoxyphene Nausea Only and Nausea And Vomiting     REVIEW OF SYSTEMS (Negative unless checked)  Constitutional: [] Weight loss  [] Fever  [] Chills Cardiac: [] Chest pain   [] Chest pressure   [] Palpitations   [] Shortness of breath when laying flat   [] Shortness of breath with exertion. Vascular:  [] Pain in legs with walking   [x] Pain in legs with standing  [] History of DVT   [] Phlebitis   [x] Swelling in legs   [] Varicose veins   [] Non-healing ulcers Pulmonary:   [] Uses home oxygen   [] Productive cough   [] Hemoptysis   [] Wheeze  [] COPD   [] Asthma Neurologic:  [] Dizziness   [] Seizures   [] History of stroke   [] History of TIA  [] Aphasia   [] Vissual changes   [] Weakness or numbness in arm   [x] Weakness or numbness in  leg Musculoskeletal:   [] Joint swelling   [] Joint pain   [x] Low back pain Hematologic:  [] Easy bruising  [] Easy bleeding   [] Hypercoagulable state   [] Anemic Gastrointestinal:  [] Diarrhea   [] Vomiting  [x] Gastroesophageal reflux/heartburn   [] Difficulty swallowing. Genitourinary:  [] Chronic kidney disease   [] Difficult urination  [] Frequent urination   [] Blood in urine Skin:  [] Rashes   [] Ulcers  Psychological:  [] History of anxiety   []  History of major depression.  Physical Examination  There were no vitals filed for this visit. There is no height or weight on file to calculate BMI. Gen: WD/WN, NAD Head: [place]/AT, No temporalis wasting.  Ear/Nose/Throat: Hearing grossly intact, nares w/o erythema or drainage, pinna without lesions Eyes: PER, EOMI, sclera nonicteric.  Neck: Supple, no gross masses.  No JVD.  Pulmonary:  Good air movement, no audible wheezing, no use of accessory muscles.  Cardiac: RRR, precordium not hyperdynamic. Vascular:  scattered varicosities present bilaterally.  Mild venous stasis changes to the legs bilaterally.  3-4+ soft pitting edema, CEAP [id number]  Vessel Right Left  Radial Palpable Palpable  Gastrointestinal: soft, non-distended. No guarding/no peritoneal signs.  Musculoskeletal: M/S 5/5 throughout.  No deformity.  Neurologic: CN 2-12 intact. Pain and light touch intact in extremities.  Symmetrical.  Speech is fluent. Motor exam as listed above. Psychiatric: Judgment intact, Mood & affect appropriate for pt's clinical situation. Dermatologic: Venous rashes no ulcers noted.  No changes consistent with cellulitis. Lymph : No lichenification or skin changes of chronic lymphedema.  CBC Lab Results  Component Value Date   WBC 3.5 (L) 09/07/2023   HGB 12.3 09/07/2023   HCT 38.5 09/07/2023   MCV 99.5 09/07/2023   PLT 273 09/07/2023    BMET    Component Value Date/Time   NA 140 09/07/2023 1031   NA 142 06/24/2014 0522   K 3.8 09/07/2023 1031   K 4.0  06/24/2014 0522   CL 111 09/07/2023 1031   CL 107 06/24/2014 0522   CO2 22 09/07/2023 1031   CO2 28 06/24/2014 0522   GLUCOSE 102 (H) 09/07/2023 1031   GLUCOSE  110 (H) 06/24/2014 0522   BUN 16 09/07/2023 1031   BUN 4 (L) 06/24/2014 0522   CREATININE 0.94 09/07/2023 1031   CREATININE 0.90 08/06/2023 1353   CREATININE 0.84 06/24/2014 0522   CALCIUM 8.9 09/07/2023 1031   CALCIUM 7.9 (L) 06/24/2014 0522   GFRNONAA >60 09/07/2023 1031   GFRNONAA >60 08/06/2023 1353   GFRNONAA >60 06/24/2014 0522   GFRNONAA >60 06/24/2013 1056   GFRAA >60 09/06/2019 1258   GFRAA >60 06/24/2014 0522   GFRAA >60 06/24/2013 1056   Estimated Creatinine Clearance: 86.2 mL/min (by C-G formula based on SCr of 0.94 mg/dL).  COAG Lab Results  Component Value Date   INR 1.0 08/22/2019   INR 1.09 01/21/2016   INR 0.9 06/24/2013    Radiology MR THORACIC SPINE W WO CONTRAST Result Date: 09/21/2023 CLINICAL DATA:  Back pain.  Evaluate for nerve stimulator. EXAM: MRI THORACIC WITHOUT AND WITH CONTRAST TECHNIQUE: Multiplanar and multiecho pulse sequences of the thoracic spine were obtained without and with intravenous contrast. CONTRAST:  10mL GADAVIST GADOBUTROL 1 MMOL/ML IV SOLN COMPARISON:  Thoracic spine MRI 08/11/2020 FINDINGS: Alignment:  Physiologic. Vertebrae: No fracture, evidence of discitis, or bone lesion. Partially imaged lumbar spinal fusion hardware at L2. Degenerative endplate changes of the inferior endplate of T12 and superior endplate of L1. Minimal T2 hyperintense signal abnormality and contrast enhancement in the T6 lamina on the left (series 19, image 13/series 24, image 13). This is new from 2021. Cord:  Normal signal and morphology. Paraspinal and other soft tissues: Negative. Disc levels: No evidence of high-grade spinal canal or neural foraminal stenosis. There is narrowing of the dorsal epidural space at the T8-T11 levels secondary to ligamentum flavum hypertrophy (series 20, image 27-33)  IMPRESSION: 1. No evidence of high-grade spinal canal or neural foraminal stenosis. 2. Narrowing of the dorsal epidural space at the T8-T11 levels secondary to ligamentum flavum hypertrophy. 3. Minimal T2 hyperintense signal abnormality and contrast enhancement in the left lamina at T6. While this finding is most likely degenerative in nature, a follow-up thoracic spine MRI in 1-2 months is recommended to assess for interval change and exclude the possibility of malignancy Electronically Signed   By: Lorenza Cambridge M.D.   On: 09/21/2023 09:00     Assessment/Plan There are no diagnoses linked to this encounter.   Levora Dredge, MD  09/25/2023 5:42 PM

## 2023-09-27 ENCOUNTER — Encounter (INDEPENDENT_AMBULATORY_CARE_PROVIDER_SITE_OTHER): Payer: Self-pay | Admitting: Vascular Surgery

## 2023-09-27 ENCOUNTER — Ambulatory Visit (INDEPENDENT_AMBULATORY_CARE_PROVIDER_SITE_OTHER): Payer: Medicaid Other | Admitting: Vascular Surgery

## 2023-09-27 VITALS — BP 125/80 | HR 71 | Resp 18 | Ht 64.0 in | Wt 258.8 lb

## 2023-09-27 DIAGNOSIS — J45909 Unspecified asthma, uncomplicated: Secondary | ICD-10-CM | POA: Diagnosis not present

## 2023-09-27 DIAGNOSIS — M7989 Other specified soft tissue disorders: Secondary | ICD-10-CM

## 2023-09-27 DIAGNOSIS — K219 Gastro-esophageal reflux disease without esophagitis: Secondary | ICD-10-CM

## 2023-09-27 DIAGNOSIS — M79669 Pain in unspecified lower leg: Secondary | ICD-10-CM

## 2023-09-27 DIAGNOSIS — I890 Lymphedema, not elsewhere classified: Secondary | ICD-10-CM

## 2023-09-28 ENCOUNTER — Ambulatory Visit: Payer: Medicaid Other

## 2023-10-01 ENCOUNTER — Ambulatory Visit: Payer: Medicaid Other

## 2023-10-01 DIAGNOSIS — M6281 Muscle weakness (generalized): Secondary | ICD-10-CM | POA: Diagnosis not present

## 2023-10-01 DIAGNOSIS — R262 Difficulty in walking, not elsewhere classified: Secondary | ICD-10-CM

## 2023-10-01 DIAGNOSIS — R278 Other lack of coordination: Secondary | ICD-10-CM

## 2023-10-01 DIAGNOSIS — R2689 Other abnormalities of gait and mobility: Secondary | ICD-10-CM

## 2023-10-01 DIAGNOSIS — M25512 Pain in left shoulder: Secondary | ICD-10-CM

## 2023-10-01 DIAGNOSIS — R2681 Unsteadiness on feet: Secondary | ICD-10-CM

## 2023-10-01 DIAGNOSIS — L905 Scar conditions and fibrosis of skin: Secondary | ICD-10-CM

## 2023-10-01 DIAGNOSIS — G8929 Other chronic pain: Secondary | ICD-10-CM

## 2023-10-01 DIAGNOSIS — M25612 Stiffness of left shoulder, not elsewhere classified: Secondary | ICD-10-CM

## 2023-10-01 DIAGNOSIS — M5459 Other low back pain: Secondary | ICD-10-CM

## 2023-10-01 NOTE — Therapy (Signed)
OUTPATIENT PHYSICAL THERAPY SHOULDER and ORTHO TREATMENT NOTE    Patient Name: Vanessa Medina MRN: [id number] DOB:1971-02-02, 53 y.o., female Today's Date: 10/01/2023  END OF SESSION:  PT End of Session - 10/01/23 1023     Visit Number 15    Number of Visits 34    Date for PT Re-Evaluation 11/15/23    Progress Note Due on Visit 20    PT Start Time 1018    PT Stop Time 1059    PT Time Calculation (min) 41 min    Equipment Utilized During Treatment Gait belt    Activity Tolerance Patient tolerated treatment well;Patient limited by pain;Patient limited by fatigue    Behavior During Therapy [facility] for tasks assessed/performed                          Past Medical History:  Diagnosis Date   Acute nontraumatic kidney injury (HCC)    Anemia    Anxiety    Benign cyst of kidney    Breast cancer (HCC) 01/18/2023   left breast   Cervical cancer (HCC)    Chronic hypotension    Chronic kidney disease    Previous now clear   Claustrophobia    Decompression injury of spinal cord    Depression    Family history of adverse reaction to anesthesia     " my mother takes a long time time wake up"   History of dysplastic nevus 01/18/2018   right shoulder, recurrent dysplastic nevus   History of kidney stones    History of shingles    Migraine    Multiple allergies    Obesity    Osteoarthritis    left hip   Pneumonia    PONV (postoperative nausea and vomiting)    slow to wake up   Sleep apnea    does not wear CPAP   Slow to wake up after anesthesia    Stroke (HCC) 08/11/2020   spinal cord stroke, spinal cord puncture during surgery   Wears glasses    Past Surgical History:  Procedure Laterality Date   ABDOMINAL HYSTERECTOMY     APPENDECTOMY     BACK SURGERY     BILIOPANCREATIC DIVISION W DUODENAL SWITCH N/A    BREAST BIOPSY Left 12/06/2022   u/s bx,1:00 heart clip, path pending   BREAST BIOPSY Left 12/06/2022   [name] bx 2:00 ribbon clip path pending    BREAST BIOPSY Left 12/06/2022   [name] LT BREAST BX W LOC DEV EA ADD LESION IMG BX SPEC US GUIDE 12/06/2022 [facility]   BREAST BIOPSY Left 12/06/2022   [name] LT BREAST BX W LOC DEV 1ST LESION IMG BX SPEC US GUIDE 12/06/2022 [facility]   BREAST RECONSTRUCTION WITH PLACEMENT OF TISSUE EXPANDER AND FLEX HD (ACELLULAR HYDRATED DERMIS) Bilateral 01/18/2023   Procedure: IMMEDIATE LEFT BREAST RECONSTRUCTION WITH PLACEMENT OF TISSUE EXPANDER AND FLEX HD (ACELLULAR HYDRATED DERMIS);  Surgeon: Peggye Form, DO;  Location: [facility];  Service: Plastics;  Laterality: Bilateral;   BREAST RECONSTRUCTION WITH PLACEMENT OF TISSUE EXPANDER AND FLEX HD (ACELLULAR HYDRATED DERMIS) Left 03/26/2023   Procedure: BREAST RECONSTRUCTION WITH PLACEMENT OF TISSUE EXPANDER;  Surgeon: Peggye Form, DO;  Location: [facility];  Service: Plastics;  Laterality: Left;   CHOLECYSTECTOMY     COLONOSCOPY N/A 06/27/2021   Procedure: COLONOSCOPY;  Surgeon: Jaynie Collins, DO;  Location: [facility] END[facility]COPY;  Service: Gastroenterology;  Laterality: N/A;   COLONOSCOPY N/A  08/27/2023   Procedure: COLONOSCOPY;  Surgeon: Jaynie Collins, DO;  Location: [facility];  Service: Gastroenterology;  Laterality: N/A;   DIAGNOSTIC LAPAROSCOPY     LOA   ESOPHAGOGASTRODUODENOSCOPY N/A 06/27/2021   Procedure: ESOPHAGOGASTRODUODENOSCOPY (EGD);  Surgeon: Jaynie Collins, DO;  Location: [facility] ENDOSCOPY;  Service: Gastroenterology;  Laterality: N/A;   ESOPHAGOGASTRODUODENOSCOPY (EGD) WITH PROPOFOL N/A 07/25/2017   Procedure: ESOPHAGOGASTRODUODENOSCOPY (EGD) WITH PROPOFOL;  Surgeon: Scot Jun, MD;  Location: [facility] ENDOSCOPY;  Service: Endoscopy;  Laterality: N/A;   FLEXIBLE BRONCHOSCOPY     HERNIA REPAIR     KNEE ARTHROSCOPY     LAPAROSCOPIC GASTRIC SLEEVE RESECTION WITH HIATAL HERNIA REPAIR     LUMBAR FUSION  08/29/2022   Revision Lumbar two through five Laminectomy & Fusion with Transforaminal  Lumbar interbody fusion   MASTECTOMY W/ SENTINEL NODE BIOPSY Bilateral 01/18/2023   Procedure: MASTECTOMY WITH SENTINEL LYMPH NODE BIOPSY, RNFA to assist;  Surgeon: Leafy Ro, MD;  Location: [facility];  Service: General;  Laterality: Bilateral;   REMOVAL OF TISSUE EXPANDER AND PLACEMENT OF IMPLANT Bilateral 06/06/2023   Procedure: REMOVAL OF TISSUE EXPANDER AND PLACEMENT OF IMPLANT;  Surgeon: Peggye Form, DO;  Location: [facility];  Service: Plastics;  Laterality: Bilateral;   SPINAL CORD STIMULATOR IMPLANT     TONSILLECTOMY     TOTAL HIP ARTHROPLASTY Left 01/26/2016   Procedure: LEFT TOTAL HIP ARTHROPLASTY ANTERIOR APPROACH;  Surgeon: Tarry Kos, MD;  Location: [facility];  Service: Orthopedics;  Laterality: Left;   TRANSFORAMINAL LUMBAR INTERBODY FUSION (TLIF) WITH PEDICLE SCREW FIXATION 3 LEVEL  08/2019   Vira Browns, MD L2-L5   Patient Active Problem List   Diagnosis Date Noted   Pain and swelling of lower leg 09/25/2023   Lymphedema 09/25/2023   Long term (current) use of aromatase inhibitors 09/07/2023   At risk for loss of bone density 09/07/2023   Changing skin lesion 09/04/2023   Frequent falls 08/22/2023   Chronic pain syndrome 08/22/2023   Bilateral lower extremity edema 08/06/2023   Back pain 08/06/2023   Claustrophobia    Pernicious anemia 03/14/2023   Ruptured left breast implant 02/13/2023   S/P mastectomy, bilateral 01/18/2023   Genetic testing 12/18/2022   Breast cancer (HCC) 12/08/2022   Family history of breast cancer 12/08/2022   Profound fatigue 12/08/2022   Lumbar post-laminectomy syndrome 06/02/2022   Pseudarthrosis after fusion or arthrodesis 05/19/2022   Primary osteoarthritis of right knee 03/28/2022   Subjective tinnitus of both ears 12/21/2021   Abnormality of gait 04/29/2021   Abnormal sensation in both ears 04/06/2021   Other adverse food reactions, not elsewhere classified, subsequent encounter 04/06/2021   Multiple drug  allergies 04/06/2021   Insomnia due to medical condition 02/25/2021   Myofascial muscle pain 02/25/2021   Nerve pain 10/18/2020   Incomplete paraplegia (HCC) 10/18/2020   Orthostatic hypotension 10/18/2020   Renal cyst 09/23/2020   Chronic migraine without aura without status migrainosus, not intractable    Hypokalemia    Adjustment reaction with anxiety and depression    Spinal cord injury, lumbar, without spinal bone injury, sequela (HCC) 08/18/2020   Paraparesis (HCC)    Post-operative pain    Hypotension    Slow transit constipation    AKI (acute kidney injury) (HCC)    Right leg weakness 08/11/2020   DDD (degenerative disc disease), lumbar 09/02/2019    Class: Chronic   Degenerative disc disease, lumbar 09/02/2019   Chest tightness 09/23/2018   Bradycardia  09/23/2018   BMI 40.0-44.9, adult (HCC) 04/17/2018   Status post bariatric surgery 04/17/2018   Labile blood pressure 03/08/2018   Chronic low back pain 09/03/2017   History of total hip replacement, left 01/26/2016   Osteoarthritis of spine with radiculopathy, lumbar region 10/16/2014   LEG PAIN, BILATERAL 12/16/2007   Malignant neoplasm of cervix uteri (HCC) 07/02/2007   MORBID OBESITY 07/02/2007   DEPRESSION 07/02/2007   Migraine without aura 07/02/2007   Other allergic rhinitis 07/02/2007   Asthma 07/02/2007   GERD 07/02/2007   ELEVATED BLOOD PRESSURE WITHOUT DIAGNOSIS OF HYPERTENSION 07/02/2007   Asthma 07/02/2007    PCP: [name] MD  REFERRING PROVIDER: Caroline More  REFERRING DIAG: s/p breast reconstruction  THERAPY DIAG:  Muscle weakness (generalized)  Difficulty in walking, not elsewhere classified  Unsteadiness on feet  Chronic left shoulder pain  Other low back pain  Stiffness of left shoulder, not elsewhere classified  Left shoulder pain, unspecified chronicity  Other abnormalities of gait and mobility  Scar tissue  Other lack of coordination  Chronic midline low back  pain, unspecified whether sciatica present  Rationale for Evaluation and Treatment: Rehabilitation  ONSET DATE: December 07 2022  SUBJECTIVE:                                                                                                                                                                                      SUBJECTIVE STATEMENT:  Patient reports back pain is still there of course but no  6/10 pain in low back. Was called to schedule an MRI for her low back.   Hand dominance: Right  PERTINENT HISTORY: Patient is having LUE ROM issues s/p breast cancer implant reconstruction. Patient underwent bilateral exchange of tissue expanders for implants, bilateral capsulotomies for implant repositioning and right breast capsulectomy with major repositioning of 3 x 8 cm of capsule on 06/06/2023. Patient had back surgery December 26th 2023 and then was diagnosed with breast cancer December 07, 2022. Had surgery Jan 18, 2023 for her breast cancer, had double mastectomy. Patient had two sets of expanders, first one burst, the second one kept flipping and moving. Had to have more work on L side. PMH includes: anemia, anxiety, breast cancer, CKD, claustrophobia, depression, migraines, OA, PONV, sleep apnea, stroke, lumbar fusion, mastectomy.   08/23/2023:  Pt with new orders to address back pain prior to possible surgery for [facility] stimulator. Pt with hx of multiple back surgeries with chronic pain and now worsening pain with decline in mobility. Worst pain reaches 10/10, best 4/10. Pt ideally would like to improve pain. She would also like to improve her ability to walk, reports has not  been able to go for walks in a while. Was ambulating without an AD for a while but now must use SPC again. She reports ability to stand, such as to do dishes, is limited due to pain. Pt experiences radiating pain down RLE into R foot and toes.     PAIN:  Are you having pain? Yes: NPRS scale: 4/10 Pain location: L shoulder and  breast Pain description: post surgical Aggravating factors: lift something  Relieving factors: none Worst: 7/10 Least: 2/10    PRECAUTIONS: Other: no lifting greater than 1/2 gallon  , pt reports LATEX ALLERGY   RED FLAGS: None   WEIGHT BEARING RESTRICTIONS:  no more than 1/2 gallon  FALLS:  Has patient fallen in last 6 months? Yes. Number of falls 1  LIVING ENVIRONMENT: Lives with: lives with their family Lives in: House/apartment Stairs: Yes: External: 2 steps; on right going up and on left going up Has following equipment at home: Single point cane, Walker - 2 wheeled, shower chair, and Grab bars  OCCUPATION: Disability   PLOF: Independent with basic ADLs  PATIENT GOALS:to decrease pain in L shoulder;   NEXT MD VISIT:   OBJECTIVE:  Note: Objective measures were completed at Evaluation unless otherwise noted.  DIAGNOSTIC FINDINGS:  N/a   PATIENT SURVEYS:  FOTO 32  COGNITION: Overall cognitive status:  some short term memory      SENSATION: WFL  POSTURE: Rounding shoulders,   UPPER EXTREMITY ROM:   Active ROM Right eval Left eval  Shoulder flexion 115* 90  Shoulder extension 48 41  Shoulder abduction 81* 89  Shoulder adduction    Shoulder internal rotation    Shoulder external rotation Unable to test in testing position Unable to test in testing position  (Blank rows = not tested) *  UPPER EXTREMITY MMT:   LE MMT: 08/23/23 Gross BLE strength is 4-/5, pain-limited RLE   SHOULDER SPECIAL TESTS: Deferred due to limited ROM   JOINT MOBILITY TESTING:  GH: hypomobile AP, PA, inferior glide bilateral   PALPATION:  Muscle guarding subscap, pectoral, glenohumeral region  Significant fluid on L flank    TODAY'S TREATMENT:                                                                                                                                         DATE: 10/01/23  TherEx: gentle stretching  Supine:  Lower trunk rotation 20x Supine  knee to chest - hold 30 sec x3 each Side supine Hamstring stretch- hold 60 seconds with sciatic nerve glide-  each LE  Supine Hip ER/IR Active ROM (hooklye with feet apart)  x 15 reps  TrA contraction in hooklye- hold 5 sec x 10  TrA contraction in hookye-with hip ER x 10 reps TrA contraction with leg lift off (from hooklye position) x 10 alt LE.      Sit to stand with TrA  x 15 reps   Seated  Lumbar flex AROM- x 15 reps  PATIENT EDUCATION: Education details: goals, plan Person educated: Patient Education method: Explanation, Demonstration, Tactile cues, and Verbal cues Education comprehension: verbalized understanding, returned demonstration, verbal cues required, and tactile cues required  HOME EXERCISE PROGRAM:    PREV Access Code: [email or web address] URL: [email or web address] Date: 08/16/2023 Prepared by: Temple Pacini  Exercises - Standing Shoulder External Rotation with Resistance  - 1 x daily - 5-6 x weekly - 3 sets - 10 reps - Supine Alternating Shoulder Flexion  - 1 x daily - 5-6 x weekly - 3 sets - 10 reps - Seated Shoulder Row with Anchored Resistance  - 1 x daily - 5-6 x weekly - 3 sets - 10 reps   12/2: Access Code: [email or web address] URL: [email or web address] Date: 08/06/2023 Prepared by: Temple Pacini  Exercises - Seated Shoulder Flexion Towel Slide at Table Top  - 1 x daily - 5-7 x weekly - 2 sets - 10 reps - 15-30 sec hold - Seated Shoulder Abduction Towel Slide at Table Top  - 1 x daily - 5-7 x weekly - 2 sets - 10 reps - 15-30 sec hold  11/26: updated Access Code: [email or web address] URL: [email or web address] Date: 07/31/2023 Prepared by: Temple Pacini  Exercises - Isometric Shoulder Flexion at Wall  - 1 x daily - 7 x weekly - 2 sets - 10 reps - Isometric Shoulder Extension at Wall  - 1 x daily - 7 x weekly - 2 sets - 10 reps - Standing Isometric Shoulder Internal Rotation at Doorway  - 1 x daily - 7 x weekly - 2 sets - 10 reps -  Standing Isometric Shoulder External Rotation with Doorway  - 1 x daily - 7 x weekly - 2 sets - 10 reps  Access Code: [email or web address] URL: [email or web address] Date: 07/02/2023 Prepared by: Temple Pacini  Exercises - Seated Shoulder Shrugs  - 2 x daily - 6-7 x weekly - 3 sets - 10 reps - Seated Shoulder Shrug Circles AROM Backward  - 2 x daily - 6-7 x weekly - 3 sets - 10 reps - Seated Scapular Retraction  - 1 x daily - 6-7 x weekly - 3 sets - 10 reps  ASSESSMENT:  CLINICAL IMPRESSION: Patient responded positively to treatment today- stating feeling some relief with stretching and performed well with progression of core stabilization. She required some VC to correctly activate transverse abdominals but then able to perform correctly and add several activities with good form and no report of any increased low back pain.   Patient will benefit from skilled physical therapy to improve ROM and strength and decrease pain for improved quality of life.     OBJECTIVE IMPAIRMENTS: Abnormal gait, decreased endurance, decreased mobility, decreased ROM, decreased strength, hypomobility, increased edema, increased fascial restrictions, impaired perceived functional ability, increased muscle spasms, impaired flexibility, impaired UE functional use, improper body mechanics, postural dysfunction, and pain.   ACTIVITY LIMITATIONS: carrying, lifting, bed mobility, bathing, toileting, dressing, self feeding, reach over head, hygiene/grooming, and caring for others  PARTICIPATION LIMITATIONS: meal prep, cleaning, laundry, medication management, shopping, community activity, and yard work  PERSONAL FACTORS: Age, Fitness, Past/current experiences, Time since onset of injury/illness/exacerbation, and 3+ comorbidities: anemia, anxiety, breast cancer, CKD, claustrophobia, depression, migraines, OA, PONV, sleep apnea, stroke, lumbar fusion, mastectomy.   are also affecting patient's functional outcome.    REHAB POTENTIAL: Good  CLINICAL DECISION MAKING: Evolving/moderate complexity  EVALUATION COMPLEXITY: Moderate   GOALS: Goals reviewed with patient? Yes  SHORT TERM GOALS: Target date: 10/04/2023      Patient will be independent in home exercise program to improve strength/mobility for better functional independence with ADLs. Baseline: 10/23: give next session ;12/19: pt indep Goal status: MET    LONG TERM GOALS: Target date: 11/15/2023    Patient will increase FOTO score to equal to or greater than 49    to demonstrate statistically significant improvement in mobility and quality of life.  Baseline: 32; 08/23/2023: 31 Goal status: INITIAL  2.  Patient will report a worst pain of 3/10 on VAS in L shoulder  to improve tolerance with ADLs and reduced symptoms with activities.  Baseline: 7/10; 12/19: pt reports worst pain is a 6/10, felt in the evenings Goal status: ONGOING  3.  Patient will improve shoulder AROM to > 140 degrees of flexion, scaption, and abduction for improved ability to perform overhead activities. Baseline:12/19: flexion 160 deg; abduction ~140 pain limited Goal status: PARTIALLY MET  4.  Patient will improve shoulder strength to 4/5 for improved functional ADL and iADL performance Baseline: 16/10: unable to perform today due to pain and ROM limitations;12/19: grossly 4-/5  Goal status: ONGOING  5. Patient will increase six minute walk test distance to >1000 for progression to community ambulator and improve gait ability  Baseline: 652 ft with SPC; 12/19: 391 ft with SPC  Goal status: INITIAL  6. Patient will reduce modified Oswestry score to <50 as to demonstrate decreased disability with ADLs including improved sleeping tolerance, walking/sitting tolerance etc for better mobility with ADLs.  Baseline: 78% Goal status: NEW   7. Patient will increase BLE gross strength to 4+/5 as to improve functional strength for independent gait, increased  standing tolerance and increased ADL ability. Baseline:Gross BLE strength is 4-/5, pain-limited RLE  Goal status: NEW   PLAN:  PT FREQUENCY: 2x/week  PT DURATION: 12 weeks  PLANNED INTERVENTIONS: 97164- PT Re-evaluation, 97110-Therapeutic exercises, 97530- Therapeutic activity, 97112- Neuromuscular re-education, 97535- Self Care, 96045- Manual therapy, [id number]- Gait training, 97014- Electrical stimulation (unattended), [phone]- Electrical stimulation (manual), [id number]- Traction (mechanical), Patient/Family education, Balance training, Taping, Joint mobilization, Spinal mobilization, Manual lymph drainage, Scar mobilization, Compression bandaging, Vestibular training, Visual/preceptual remediation/compensation, Cognitive remediation, Cryotherapy, Moist heat, and Biofeedback  PLAN FOR NEXT SESSION:  Progression of core stabilization and continuation of LE stretching.   Lenda Kelp, PT 10/01/2023, 11:20 AM

## 2023-10-02 ENCOUNTER — Ambulatory Visit: Payer: Medicaid Other

## 2023-10-04 ENCOUNTER — Ambulatory Visit: Payer: Medicaid Other

## 2023-10-09 ENCOUNTER — Ambulatory Visit: Payer: Medicaid Other | Attending: Gastroenterology

## 2023-10-09 DIAGNOSIS — M25512 Pain in left shoulder: Secondary | ICD-10-CM | POA: Diagnosis present

## 2023-10-09 DIAGNOSIS — R2681 Unsteadiness on feet: Secondary | ICD-10-CM | POA: Insufficient documentation

## 2023-10-09 DIAGNOSIS — M25612 Stiffness of left shoulder, not elsewhere classified: Secondary | ICD-10-CM | POA: Diagnosis present

## 2023-10-09 DIAGNOSIS — R262 Difficulty in walking, not elsewhere classified: Secondary | ICD-10-CM | POA: Diagnosis present

## 2023-10-09 DIAGNOSIS — M5459 Other low back pain: Secondary | ICD-10-CM | POA: Insufficient documentation

## 2023-10-09 DIAGNOSIS — M6281 Muscle weakness (generalized): Secondary | ICD-10-CM | POA: Diagnosis present

## 2023-10-09 DIAGNOSIS — I972 Postmastectomy lymphedema syndrome: Secondary | ICD-10-CM | POA: Insufficient documentation

## 2023-10-09 DIAGNOSIS — R2689 Other abnormalities of gait and mobility: Secondary | ICD-10-CM | POA: Insufficient documentation

## 2023-10-09 DIAGNOSIS — R278 Other lack of coordination: Secondary | ICD-10-CM | POA: Diagnosis present

## 2023-10-09 DIAGNOSIS — L905 Scar conditions and fibrosis of skin: Secondary | ICD-10-CM | POA: Insufficient documentation

## 2023-10-09 DIAGNOSIS — G8929 Other chronic pain: Secondary | ICD-10-CM | POA: Diagnosis present

## 2023-10-09 NOTE — Therapy (Signed)
 OUTPATIENT PHYSICAL THERAPY TREATMENT     Patient Name: Vanessa Medina MRN: [id number] DOB:10/31/1970, 53 y.o., female Today's Date: 10/09/2023  END OF SESSION:  PT End of Session - 10/09/23 1449     Visit Number 16    Number of Visits 34    Date for PT Re-Evaluation 11/15/23    Authorization Type [place] [employer]    Progress Note Due on Visit 20    PT Start Time 1445    PT Stop Time 1525    PT Time Calculation (min) 40 min    Activity Tolerance Patient tolerated treatment well;Patient limited by fatigue;Patient limited by pain    Behavior During Therapy [facility] for tasks assessed/performed              Past Medical History:  Diagnosis Date   Acute nontraumatic kidney injury (HCC)    Anemia    Anxiety    Benign cyst of kidney    Breast cancer (HCC) 01/18/2023   left breast   Cervical cancer (HCC)    Chronic hypotension    Chronic kidney disease    Previous now clear   Claustrophobia    Decompression injury of spinal cord    Depression    Family history of adverse reaction to anesthesia      my mother takes a long time time wake up   History of dysplastic nevus 01/18/2018   right shoulder, recurrent dysplastic nevus   History of kidney stones    History of shingles    Migraine    Multiple allergies    Obesity    Osteoarthritis    left hip   Pneumonia    PONV (postoperative nausea and vomiting)    slow to wake up   Sleep apnea    does not wear CPAP   Slow to wake up after anesthesia    Stroke (HCC) 08/11/2020   spinal cord stroke, spinal cord puncture during surgery   Wears glasses    Past Surgical History:  Procedure Laterality Date   ABDOMINAL HYSTERECTOMY     APPENDECTOMY     BACK SURGERY     BILIOPANCREATIC DIVISION W DUODENAL SWITCH N/A    BREAST BIOPSY Left 12/06/2022   u/s bx,1:00 heart clip, path pending   BREAST BIOPSY Left 12/06/2022   us  bx 2:00 ribbon clip path pending   BREAST BIOPSY Left 12/06/2022   US  LT BREAST BX W LOC DEV  EA ADD LESION IMG BX SPEC US  GUIDE 12/06/2022 [facility]   BREAST BIOPSY Left 12/06/2022   US  LT BREAST BX W LOC DEV 1ST LESION IMG BX SPEC US  GUIDE 12/06/2022 [facility]   BREAST RECONSTRUCTION WITH PLACEMENT OF TISSUE EXPANDER AND FLEX HD (ACELLULAR HYDRATED DERMIS) Bilateral 01/18/2023   Procedure: IMMEDIATE LEFT BREAST RECONSTRUCTION WITH PLACEMENT OF TISSUE EXPANDER AND FLEX HD (ACELLULAR HYDRATED DERMIS);  Surgeon: [name], DO;  Location: [facility];  Service: Plastics;  Laterality: Bilateral;   BREAST RECONSTRUCTION WITH PLACEMENT OF TISSUE EXPANDER AND FLEX HD (ACELLULAR HYDRATED DERMIS) Left 03/26/2023   Procedure: BREAST RECONSTRUCTION WITH PLACEMENT OF TISSUE EXPANDER;  Surgeon: [name], DO;  Location:  SURGERY CENTER;  Service: Plastics;  Laterality: Left;   CHOLECYSTECTOMY     COLONOSCOPY N/A 06/27/2021   Procedure: COLONOSCOPY;  Surgeon: Onita Elspeth Sharper, DO;  Location: [facility] ENDOSCOPY;  Service: Gastroenterology;  Laterality: N/A;   COLONOSCOPY N/A 08/27/2023   Procedure: COLONOSCOPY;  Surgeon: Onita Elspeth Sharper, DO;  Location: [facility] ENDOSCOPY;  Service: Gastroenterology;  Laterality: N/A;   DIAGNOSTIC LAPAROSCOPY     LOA   ESOPHAGOGASTRODUODENOSCOPY N/A 06/27/2021   Procedure: ESOPHAGOGASTRODUODENOSCOPY (EGD);  Surgeon: Onita Elspeth Sharper, DO;  Location: [facility] ENDOSCOPY;  Service: Gastroenterology;  Laterality: N/A;   ESOPHAGOGASTRODUODENOSCOPY (EGD) WITH PROPOFOL  N/A 07/25/2017   Procedure: ESOPHAGOGASTRODUODENOSCOPY (EGD) WITH PROPOFOL ;  Surgeon: Viktoria Lamar DASEN, MD;  Location: [facility] ENDOSCOPY;  Service: Endoscopy;  Laterality: N/A;   FLEXIBLE BRONCHOSCOPY     HERNIA REPAIR     KNEE ARTHROSCOPY     LAPAROSCOPIC GASTRIC SLEEVE RESECTION WITH HIATAL HERNIA REPAIR     LUMBAR FUSION  08/29/2022   Revision Lumbar two through five Laminectomy & Fusion with Transforaminal Lumbar interbody fusion   MASTECTOMY W/ SENTINEL NODE  BIOPSY Bilateral 01/18/2023   Procedure: MASTECTOMY WITH SENTINEL LYMPH NODE BIOPSY, RNFA to assist;  Surgeon: Jordis Laneta FALCON, MD;  Location: [facility];  Service: General;  Laterality: Bilateral;   REMOVAL OF TISSUE EXPANDER AND PLACEMENT OF IMPLANT Bilateral 06/06/2023   Procedure: REMOVAL OF TISSUE EXPANDER AND PLACEMENT OF IMPLANT;  Surgeon: [name], DO;  Location: [facility];  Service: Plastics;  Laterality: Bilateral;   SPINAL CORD STIMULATOR IMPLANT     TONSILLECTOMY     TOTAL HIP ARTHROPLASTY Left 01/26/2016   Procedure: LEFT TOTAL HIP ARTHROPLASTY ANTERIOR APPROACH;  Surgeon: Kay CHRISTELLA Cummins, MD;  Location: [facility];  Service: Orthopedics;  Laterality: Left;   TRANSFORAMINAL LUMBAR INTERBODY FUSION (TLIF) WITH PEDICLE SCREW FIXATION 3 LEVEL  08/2019   Lynwood Better, MD L2-L5   Patient Active Problem List   Diagnosis Date Noted   Pain and swelling of lower leg 09/25/2023   Lymphedema 09/25/2023   Long term (current) use of aromatase inhibitors 09/07/2023   At risk for loss of bone density 09/07/2023   Changing skin lesion 09/04/2023   Frequent falls 08/22/2023   Chronic pain syndrome 08/22/2023   Bilateral lower extremity edema 08/06/2023   Back pain 08/06/2023   Claustrophobia    Pernicious anemia 03/14/2023   Ruptured left breast implant 02/13/2023   S/P mastectomy, bilateral 01/18/2023   Genetic testing 12/18/2022   Breast cancer (HCC) 12/08/2022   Family history of breast cancer 12/08/2022   Profound fatigue 12/08/2022   Lumbar post-laminectomy syndrome 06/02/2022   Pseudarthrosis after fusion or arthrodesis 05/19/2022   Primary osteoarthritis of right knee 03/28/2022   Subjective tinnitus of both ears 12/21/2021   Abnormality of gait 04/29/2021   Abnormal sensation in both ears 04/06/2021   Other adverse food reactions, not elsewhere classified, subsequent encounter 04/06/2021   Multiple drug allergies 04/06/2021   Insomnia due to medical condition  02/25/2021   Myofascial muscle pain 02/25/2021   Nerve pain 10/18/2020   Incomplete paraplegia (HCC) 10/18/2020   Orthostatic hypotension 10/18/2020   Renal cyst 09/23/2020   Chronic migraine without aura without status migrainosus, not intractable    Hypokalemia    Adjustment reaction with anxiety and depression    Spinal cord injury, lumbar, without spinal bone injury, sequela (HCC) 08/18/2020   Paraparesis (HCC)    Post-operative pain    Hypotension    Slow transit constipation    AKI (acute kidney injury) (HCC)    Right leg weakness 08/11/2020   DDD (degenerative disc disease), lumbar 09/02/2019    Class: Chronic   Degenerative disc disease, lumbar 09/02/2019   Chest tightness 09/23/2018   Bradycardia 09/23/2018   BMI 40.0-44.9, adult (HCC) 04/17/2018   Status post bariatric surgery 04/17/2018  Labile blood pressure 03/08/2018   Chronic low back pain 09/03/2017   History of total hip replacement, left 01/26/2016   Osteoarthritis of spine with radiculopathy, lumbar region 10/16/2014   LEG PAIN, BILATERAL 12/16/2007   Malignant neoplasm of cervix uteri (HCC) 07/02/2007   MORBID OBESITY 07/02/2007   DEPRESSION 07/02/2007   Migraine without aura 07/02/2007   Other allergic rhinitis 07/02/2007   Asthma 07/02/2007   GERD 07/02/2007   ELEVATED BLOOD PRESSURE WITHOUT DIAGNOSIS OF HYPERTENSION 07/02/2007   Asthma 07/02/2007    PCP: Valora Agent MD  REFERRING PROVIDER: Andris Stagger  REFERRING DIAG: s/p breast reconstruction  THERAPY DIAG:  Muscle weakness (generalized)  Difficulty in walking, not elsewhere classified  Unsteadiness on feet  Chronic left shoulder pain  Other low back pain  Stiffness of left shoulder, not elsewhere classified  Rationale for Evaluation and Treatment: Rehabilitation  ONSET DATE: December 07 2022  SUBJECTIVE:                                                                                                                                                                                       SUBJECTIVE STATEMENT: Just returned from a girls trip to [place] over the week. Time in car was quite uncomfortable to back symptoms. Pt was able to manage her symptoms fairly well with activity modification, use of heat/medications, etc. HEP performed still, remains helpful to symptoms. Pt says her lumbar imaging study has been delayed a little, but MD wants to follow to r/o lumbar metastases.   Hand dominance: Right  PERTINENT HISTORY: 08/23/2023:  Pt with new orders to address back pain prior to possible surgery for [facility] stimulator. Pt with hx of multiple back surgeries with chronic pain and now worsening pain with decline in mobility. Worst pain reaches 10/10, best 4/10. Pt ideally would like to improve pain. She would also like to improve her ability to walk, reports has not been able to go for walks in a while. Was ambulating without an AD for a while but now must use SPC again. She reports ability to stand, such as to do dishes, is limited due to pain. Pt experiences radiating pain down RLE into R foot and toes. Hx: L2-5 TLIF in 2020, L2-5 TLIF revision in December 2023.  Patient is having LUE ROM issues s/p breast cancer implant reconstruction. Patient underwent bilateral exchange of tissue expanders for implants, bilateral capsulotomies for implant repositioning and right breast capsulectomy with major repositioning of 3 x 8 cm of capsule on 06/06/2023. Patient had back surgery December 26th 2023 and then was diagnosed with breast cancer December 07, 2022. Had surgery Jan 18, 2023 for her breast  cancer, had double mastectomy. Patient had two sets of expanders, first one burst, the second one kept flipping and moving. Had to have more work on L side. PMH includes: anemia, anxiety, breast cancer, CKD, claustrophobia, depression, migraines, OA, PONV, sleep apnea, stroke, lumbar fusion, mastectomy.   PAIN:  Are you having pain? 7/10 central lower  thoracic to coccyx area.      PRECAUTIONS: Other:   , pt reports LATEX ALLERGY   RED FLAGS: -no saddle numbness, chronic known incontinence;   WEIGHT BEARING RESTRICTIONS: Pt unaware of any current lifting restrictions anymore  FALLS:  Has patient fallen in last 6 months? Yes. Number of falls 1  LIVING ENVIRONMENT: Lives with: lives with their family Lives in: House/apartment Stairs: Yes: External: 2 steps; on right going up and on left going up Has following equipment at home: Single point cane, Walker - 2 wheeled, shower chair, and Grab bars  OCCUPATION: Disability  --> retirement   PLOF: Independent with basic ADLs  PATIENT GOALS:  Reduce pain in her back, improve ability to regain mobility, reduce falls   NEXT MD VISIT:   OBJECTIVE:  Note: Objective measures were completed at Evaluation unless otherwise noted.  DIAGNOSTIC FINDINGS:  N/a   PATIENT SURVEYS:  FOTO 32  COGNITION: Overall cognitive status:  some short term memory      SENSATION: Paresthesias in BLE, RLE more distal than left    LE MMT: 08/23/23 Gross BLE strength is 4-/5, pain-limited RLE    TODAY'S TREATMENT:                                                                                                                                         DATE: 10/09/23  -Hook-lying on lumbo thoracic hot pack x5 minutes  -isometric BUE shoulder extension, scap retraction in hooklying 15x3secH  -hooklying marching bracing 1x20 -DKTC stretch 1x60sec c table features to achieve  -isometric shoulder extension, scap retraction 15x3secH (palms into table)  -hooklying to sitting EOB supervision (excellent log roll execution)  -STS from plinth x5, hands ad lib  -AMB overground with RW   PATIENT EDUCATION: Education details: goals, plan Person educated: Patient Education method: Explanation, Demonstration, Tactile cues, and Verbal cues Education comprehension: verbalized understanding, returned  demonstration, verbal cues required, and tactile cues required  HOME EXERCISE PROGRAM:  PREV Access Code: [email or web address] URL: [email or web address] Date: 08/16/2023 Prepared by: Darryle Patten  Exercises - Standing Shoulder External Rotation with Resistance  - 1 x daily - 5-6 x weekly - 3 sets - 10 reps - Supine Alternating Shoulder Flexion  - 1 x daily - 5-6 x weekly - 3 sets - 10 reps - Seated Shoulder Row with Anchored Resistance  - 1 x daily - 5-6 x weekly - 3 sets - 10 reps   12/2: Access Code: [email or web address] URL: [email or web address] Date: 08/06/2023 Prepared by: Darryle Patten  Exercises - Seated Shoulder Flexion Towel  Slide at Table Top  - 1 x daily - 5-7 x weekly - 2 sets - 10 reps - 15-30 sec hold - Seated Shoulder Abduction Towel Slide at Table Top  - 1 x daily - 5-7 x weekly - 2 sets - 10 reps - 15-30 sec hold  11/26: updated Access Code: [email or web address] URL: [email or web address] Date: 07/31/2023 Prepared by: Darryle Patten  Exercises - Isometric Shoulder Flexion at Wall  - 1 x daily - 7 x weekly - 2 sets - 10 reps - Isometric Shoulder Extension at Wall  - 1 x daily - 7 x weekly - 2 sets - 10 reps - Standing Isometric Shoulder Internal Rotation at Doorway  - 1 x daily - 7 x weekly - 2 sets - 10 reps - Standing Isometric Shoulder External Rotation with Doorway  - 1 x daily - 7 x weekly - 2 sets - 10 reps  Access Code: [email or web address] URL: [email or web address] Date: 07/02/2023 Prepared by: Darryle Patten  Exercises - Seated Shoulder Shrugs  - 2 x daily - 6-7 x weekly - 3 sets - 10 reps - Seated Shoulder Shrug Circles AROM Backward  - 2 x daily - 6-7 x weekly - 3 sets - 10 reps - Seated Scapular Retraction  - 1 x daily - 6-7 x weekly - 3 sets - 10 reps  ASSESSMENT:  CLINICAL IMPRESSION: Pt tolerating consistent session after a week off traveling. Pt limited in ROM and or force production due to intolerance as a result of pain. Pain 7/10  on arrival, at end of session down 5/10. Heat reportedly useful. Patient will benefit from skilled physical therapy to improve ROM and strength and decrease pain for improved quality of life.    OBJECTIVE IMPAIRMENTS: Abnormal gait, decreased endurance, decreased mobility, decreased ROM, decreased strength, hypomobility, increased edema, increased fascial restrictions, impaired perceived functional ability, increased muscle spasms, impaired flexibility, impaired UE functional use, improper body mechanics, postural dysfunction, and pain.   ACTIVITY LIMITATIONS: carrying, lifting, bed mobility, bathing, toileting, dressing, self feeding, reach over head, hygiene/grooming, and caring for others  PARTICIPATION LIMITATIONS: meal prep, cleaning, laundry, medication management, shopping, community activity, and yard work  PERSONAL FACTORS: Age, Fitness, Past/current experiences, Time since onset of injury/illness/exacerbation, and 3+ comorbidities: anemia, anxiety, breast cancer, CKD, claustrophobia, depression, migraines, OA, PONV, sleep apnea, stroke, lumbar fusion, mastectomy.   are also affecting patient's functional outcome.   REHAB POTENTIAL: Good  CLINICAL DECISION MAKING: Evolving/moderate complexity  EVALUATION COMPLEXITY: Moderate   GOALS: Goals reviewed with patient? Yes  SHORT TERM GOALS: Target date: 10/04/2023   Patient will be independent in home exercise program to improve strength/mobility for better functional independence with ADLs. Baseline: 10/23: give next session ;12/19: pt indep Goal status: MET   LONG TERM GOALS: Target date: 11/15/2023   Patient will increase FOTO score to equal to or greater than 49    to demonstrate statistically significant improvement in mobility and quality of life.  Baseline: 32; 08/23/2023: 31 Goal status: INITIAL  2.  Patient will report a worst pain of 3/10 on VAS in L shoulder  to improve tolerance with ADLs and reduced symptoms with  activities.  Baseline: 7/10; 12/19: pt reports worst pain is a 6/10, felt in the evenings Goal status: ONGOING  3.  Patient will improve shoulder AROM to > 140 degrees of flexion, scaption, and abduction for improved ability to perform overhead activities. Baseline:12/19: flexion 160 deg; abduction ~140 pain limited Goal status: PARTIALLY MET  4.  Patient will  improve shoulder strength to 4/5 for improved functional ADL and iADL performance Baseline: 89/76: unable to perform today due to pain and ROM limitations;12/19: grossly 4-/5  Goal status: ONGOING  5. Patient will increase six minute walk test distance to >1000 for progression to community ambulator and improve gait ability  Baseline: 652 ft with SPC; 12/19: 391 ft with SPC  Goal status: INITIAL  6. Patient will reduce modified Oswestry score to <50 as to demonstrate decreased disability with ADLs including improved sleeping tolerance, walking/sitting tolerance etc for better mobility with ADLs.  Baseline: 78% Goal status: NEW   7. Patient will increase BLE gross strength to 4+/5 as to improve functional strength for independent gait, increased standing tolerance and increased ADL ability. Baseline:Gross BLE strength is 4-/5, pain-limited RLE  Goal status: NEW   PLAN:  PT FREQUENCY: 2x/week  PT DURATION: 12 weeks  PLANNED INTERVENTIONS: 97164- PT Re-evaluation, 97110-Therapeutic exercises, 97530- Therapeutic activity, 97112- Neuromuscular re-education, 97535- Self Care, [id number]- Manual therapy, [id number]- Gait training, 97014- Electrical stimulation (unattended), [phone]- Electrical stimulation (manual), [id number]- Traction (mechanical), Patient/Family education, Balance training, Taping, Joint mobilization, Spinal mobilization, Manual lymph drainage, Scar mobilization, Compression bandaging, Vestibular training, Visual/preceptual remediation/compensation, Cognitive remediation, Cryotherapy, Moist heat, and Biofeedback  PLAN FOR NEXT  SESSION:  Progression of core stabilization and continuation of LE stretching.   2:52 PM, 10/09/23 Peggye JAYSON Linear, PT, DPT Physical Therapist - [facility]  [phone])    [place] C, PT 10/09/2023, 2:52 PM

## 2023-10-12 ENCOUNTER — Telehealth: Payer: Self-pay | Admitting: Physician Assistant

## 2023-10-12 ENCOUNTER — Ambulatory Visit: Payer: Medicaid Other

## 2023-10-12 DIAGNOSIS — R262 Difficulty in walking, not elsewhere classified: Secondary | ICD-10-CM

## 2023-10-12 DIAGNOSIS — M6281 Muscle weakness (generalized): Secondary | ICD-10-CM | POA: Diagnosis not present

## 2023-10-12 DIAGNOSIS — M25612 Stiffness of left shoulder, not elsewhere classified: Secondary | ICD-10-CM

## 2023-10-12 DIAGNOSIS — R2681 Unsteadiness on feet: Secondary | ICD-10-CM

## 2023-10-12 DIAGNOSIS — M5459 Other low back pain: Secondary | ICD-10-CM

## 2023-10-12 DIAGNOSIS — G8929 Other chronic pain: Secondary | ICD-10-CM

## 2023-10-12 DIAGNOSIS — R2689 Other abnormalities of gait and mobility: Secondary | ICD-10-CM

## 2023-10-12 DIAGNOSIS — M25512 Pain in left shoulder: Secondary | ICD-10-CM

## 2023-10-12 NOTE — Therapy (Signed)
 OUTPATIENT PHYSICAL THERAPY TREATMENT     Patient Name: Vanessa Medina MRN: [id number] DOB:Mar 25, 1971, 53 y.o., female Today's Date: 10/15/2023  END OF SESSION:  PT End of Session - 10/15/23 1041     Visit Number 17    Number of Visits 34    Date for PT Re-Evaluation 11/15/23    Authorization Type [place] [employer]    Progress Note Due on Visit 20    PT Start Time 1018    PT Stop Time 1050    PT Time Calculation (min) 32 min    Activity Tolerance Patient tolerated treatment well;Patient limited by fatigue;Patient limited by pain    Behavior During Therapy [facility] for tasks assessed/performed               Past Medical History:  Diagnosis Date   Acute nontraumatic kidney injury (HCC)    Anemia    Anxiety    Benign cyst of kidney    Breast cancer (HCC) 01/18/2023   left breast   Cervical cancer (HCC)    Chronic hypotension    Chronic kidney disease    Previous now clear   Claustrophobia    Decompression injury of spinal cord    Depression    Family history of adverse reaction to anesthesia      my mother takes a long time time wake up   History of dysplastic nevus 01/18/2018   right shoulder, recurrent dysplastic nevus   History of kidney stones    History of shingles    Migraine    Multiple allergies    Obesity    Osteoarthritis    left hip   Pneumonia    PONV (postoperative nausea and vomiting)    slow to wake up   Sleep apnea    does not wear CPAP   Slow to wake up after anesthesia    Stroke (HCC) 08/11/2020   spinal cord stroke, spinal cord puncture during surgery   Wears glasses    Past Surgical History:  Procedure Laterality Date   ABDOMINAL HYSTERECTOMY     APPENDECTOMY     BACK SURGERY     BILIOPANCREATIC DIVISION W DUODENAL SWITCH N/A    BREAST BIOPSY Left 12/06/2022   u/s bx,1:00 heart clip, path pending   BREAST BIOPSY Left 12/06/2022   us  bx 2:00 ribbon clip path pending   BREAST BIOPSY Left 12/06/2022   US  LT BREAST BX W LOC  DEV EA ADD LESION IMG BX SPEC US  GUIDE 12/06/2022 [facility]   BREAST BIOPSY Left 12/06/2022   US  LT BREAST BX W LOC DEV 1ST LESION IMG BX SPEC US  GUIDE 12/06/2022 [facility]   BREAST RECONSTRUCTION WITH PLACEMENT OF TISSUE EXPANDER AND FLEX HD (ACELLULAR HYDRATED DERMIS) Bilateral 01/18/2023   Procedure: IMMEDIATE LEFT BREAST RECONSTRUCTION WITH PLACEMENT OF TISSUE EXPANDER AND FLEX HD (ACELLULAR HYDRATED DERMIS);  Surgeon: [name], DO;  Location: [facility];  Service: Plastics;  Laterality: Bilateral;   BREAST RECONSTRUCTION WITH PLACEMENT OF TISSUE EXPANDER AND FLEX HD (ACELLULAR HYDRATED DERMIS) Left 03/26/2023   Procedure: BREAST RECONSTRUCTION WITH PLACEMENT OF TISSUE EXPANDER;  Surgeon: [name], DO;  Location: [facility];  Service: Plastics;  Laterality: Left;   CHOLECYSTECTOMY     COLONOSCOPY N/A 06/27/2021   Procedure: COLONOSCOPY;  Surgeon: Onita Elspeth Sharper, DO;  Location: [facility];  Service: Gastroenterology;  Laterality: N/A;   COLONOSCOPY N/A 08/27/2023   Procedure: COLONOSCOPY;  Surgeon: Onita Elspeth Sharper, DO;  Location: [facility];  Service: Gastroenterology;  Laterality: N/A;   DIAGNOSTIC LAPAROSCOPY     LOA   ESOPHAGOGASTRODUODENOSCOPY N/A 06/27/2021   Procedure: ESOPHAGOGASTRODUODENOSCOPY (EGD);  Surgeon: Onita Elspeth Sharper, DO;  Location: [facility]COPY;  Service: Gastroenterology;  Laterality: N/A;   ESOPHAGOGASTRODUODENOSCOPY (EGD) WITH PROPOFOL  N/A 07/25/2017   Procedure: ESOPHAGOGASTRODUODENOSCOPY (EGD) WITH PROPOFOL ;  Surgeon: Viktoria Lamar DASEN, MD;  Location: [facility];  Service: Endoscopy;  Laterality: N/A;   FLEXIBLE BRONCHOSCOPY     HERNIA REPAIR     KNEE ARTHROSCOPY     LAPAROSCOPIC GASTRIC SLEEVE RESECTION WITH HIATAL HERNIA REPAIR     LUMBAR FUSION  08/29/2022   Revision Lumbar two through five Laminectomy & Fusion with Transforaminal Lumbar interbody fusion   MASTECTOMY W/ SENTINEL NODE  BIOPSY Bilateral 01/18/2023   Procedure: MASTECTOMY WITH SENTINEL LYMPH NODE BIOPSY, RNFA to assist;  Surgeon: Jordis Laneta FALCON, MD;  Location: [facility];  Service: General;  Laterality: Bilateral;   REMOVAL OF TISSUE EXPANDER AND PLACEMENT OF IMPLANT Bilateral 06/06/2023   Procedure: REMOVAL OF TISSUE EXPANDER AND PLACEMENT OF IMPLANT;  Surgeon: [name], DO;  Location: [facility];  Service: Plastics;  Laterality: Bilateral;   SPINAL CORD STIMULATOR IMPLANT     TONSILLECTOMY     TOTAL HIP ARTHROPLASTY Left 01/26/2016   Procedure: LEFT TOTAL HIP ARTHROPLASTY ANTERIOR APPROACH;  Surgeon: Kay CHRISTELLA Cummins, MD;  Location: [facility];  Service: Orthopedics;  Laterality: Left;   TRANSFORAMINAL LUMBAR INTERBODY FUSION (TLIF) WITH PEDICLE SCREW FIXATION 3 LEVEL  08/2019   Lynwood Better, MD L2-L5   Patient Active Problem List   Diagnosis Date Noted   Pain and swelling of lower leg 09/25/2023   Lymphedema 09/25/2023   Long term (current) use of aromatase inhibitors 09/07/2023   At risk for loss of bone density 09/07/2023   Changing skin lesion 09/04/2023   Frequent falls 08/22/2023   Chronic pain syndrome 08/22/2023   Bilateral lower extremity edema 08/06/2023   Back pain 08/06/2023   Claustrophobia    Pernicious anemia 03/14/2023   Ruptured left breast implant 02/13/2023   S/P mastectomy, bilateral 01/18/2023   Genetic testing 12/18/2022   Breast cancer (HCC) 12/08/2022   Family history of breast cancer 12/08/2022   Profound fatigue 12/08/2022   Lumbar post-laminectomy syndrome 06/02/2022   Pseudarthrosis after fusion or arthrodesis 05/19/2022   Primary osteoarthritis of right knee 03/28/2022   Subjective tinnitus of both ears 12/21/2021   Abnormality of gait 04/29/2021   Abnormal sensation in both ears 04/06/2021   Other adverse food reactions, not elsewhere classified, subsequent encounter 04/06/2021   Multiple drug allergies 04/06/2021   Insomnia due to medical condition  02/25/2021   Myofascial muscle pain 02/25/2021   Nerve pain 10/18/2020   Incomplete paraplegia (HCC) 10/18/2020   Orthostatic hypotension 10/18/2020   Renal cyst 09/23/2020   Chronic migraine without aura without status migrainosus, not intractable    Hypokalemia    Adjustment reaction with anxiety and depression    Spinal cord injury, lumbar, without spinal bone injury, sequela (HCC) 08/18/2020   Paraparesis (HCC)    Post-operative pain    Hypotension    Slow transit constipation    AKI (acute kidney injury) (HCC)    Right leg weakness 08/11/2020   DDD (degenerative disc disease), lumbar 09/02/2019    Class: Chronic   Degenerative disc disease, lumbar 09/02/2019   Chest tightness 09/23/2018   Bradycardia 09/23/2018   BMI 40.0-44.9, adult (HCC) 04/17/2018   Status post bariatric surgery 04/17/2018  Labile blood pressure 03/08/2018   Chronic low back pain 09/03/2017   History of total hip replacement, left 01/26/2016   Osteoarthritis of spine with radiculopathy, lumbar region 10/16/2014   LEG PAIN, BILATERAL 12/16/2007   Malignant neoplasm of cervix uteri (HCC) 07/02/2007   MORBID OBESITY 07/02/2007   DEPRESSION 07/02/2007   Migraine without aura 07/02/2007   Other allergic rhinitis 07/02/2007   Asthma 07/02/2007   GERD 07/02/2007   ELEVATED BLOOD PRESSURE WITHOUT DIAGNOSIS OF HYPERTENSION 07/02/2007   Asthma 07/02/2007    PCP: Valora Agent MD  REFERRING PROVIDER: Andris Stagger  REFERRING DIAG: s/p breast reconstruction  THERAPY DIAG:  Muscle weakness (generalized)  Difficulty in walking, not elsewhere classified  Unsteadiness on feet  Chronic left shoulder pain  Other low back pain  Stiffness of left shoulder, not elsewhere classified  Left shoulder pain, unspecified chronicity  Other abnormalities of gait and mobility  Rationale for Evaluation and Treatment: Rehabilitation  ONSET DATE: December 07 2022  SUBJECTIVE:                                                                                                                                                                                       SUBJECTIVE STATEMENT: My right leg is not good. Back pain is about a 7/10 today.     Hand dominance: Right  PERTINENT HISTORY: 08/23/2023:  Pt with new orders to address back pain prior to possible surgery for [facility] stimulator. Pt with hx of multiple back surgeries with chronic pain and now worsening pain with decline in mobility. Worst pain reaches 10/10, best 4/10. Pt ideally would like to improve pain. She would also like to improve her ability to walk, reports has not been able to go for walks in a while. Was ambulating without an AD for a while but now must use SPC again. She reports ability to stand, such as to do dishes, is limited due to pain. Pt experiences radiating pain down RLE into R foot and toes. Hx: L2-5 TLIF in 2020, L2-5 TLIF revision in December 2023.  Patient is having LUE ROM issues s/p breast cancer implant reconstruction. Patient underwent bilateral exchange of tissue expanders for implants, bilateral capsulotomies for implant repositioning and right breast capsulectomy with major repositioning of 3 x 8 cm of capsule on 06/06/2023. Patient had back surgery December 26th 2023 and then was diagnosed with breast cancer December 07, 2022. Had surgery Jan 18, 2023 for her breast cancer, had double mastectomy. Patient had two sets of expanders, first one burst, the second one kept flipping and moving. Had to have more work on L side. PMH includes: anemia, anxiety, breast cancer, CKD,  claustrophobia, depression, migraines, OA, PONV, sleep apnea, stroke, lumbar fusion, mastectomy.   PAIN:  Are you having pain? 7/10 central lower thoracic to coccyx area.      PRECAUTIONS: Other:   , pt reports LATEX ALLERGY   RED FLAGS: -no saddle numbness, chronic known incontinence;   WEIGHT BEARING RESTRICTIONS: Pt unaware of any current lifting  restrictions anymore  FALLS:  Has patient fallen in last 6 months? Yes. Number of falls 1  LIVING ENVIRONMENT: Lives with: lives with their family Lives in: House/apartment Stairs: Yes: External: 2 steps; on right going up and on left going up Has following equipment at home: Single point cane, Walker - 2 wheeled, shower chair, and Grab bars  OCCUPATION: Disability  --> retirement   PLOF: Independent with basic ADLs  PATIENT GOALS:  Reduce pain in her back, improve ability to regain mobility, reduce falls   NEXT MD VISIT:   OBJECTIVE:  Note: Objective measures were completed at Evaluation unless otherwise noted.  DIAGNOSTIC FINDINGS:  N/a   PATIENT SURVEYS:  FOTO 32  COGNITION: Overall cognitive status:  some short term memory      SENSATION: Paresthesias in BLE, RLE more distal than left    LE MMT: 08/23/23 Gross BLE strength is 4-/5, pain-limited RLE    TODAY'S TREATMENT:                                                                                                                                         DATE: 10/15/23   THEREX: -Hook-lying on lumbo thoracic hot pack with some manual LE stretching: Single knee to chest; R LE hamstring, Attempted lower trunk rotation- Stopped all due to increased low back with just minimal stretching.   Therapeutic activities:  TrA bracing in hooklye - hold 5 sec x 15 reps TrA bracing with alt LE leg lifts- 12 reps each LE   -hooklying to sitting EOB supervision using correct log roll technique   Stopped treatment due to worsening low back pain with all active movement today.    [facility]- as patient is being followed by Lyle Decamp, PA- Spoke with front office personel- assisted patient in scheduling a follow up visit due to ongoing low back with minimal Short term relief to date. Patient now has f/u appt on 10/17/2023.   PATIENT EDUCATION: Education details: goals, plan Person educated:  Patient Education method: Explanation, Demonstration, Tactile cues, and Verbal cues Education comprehension: verbalized understanding, returned demonstration, verbal cues required, and tactile cues required  HOME EXERCISE PROGRAM:  PREV Access Code: [email or web address] URL: [email or web address] Date: 08/16/2023 Prepared by: Darryle Patten  Exercises - Standing Shoulder External Rotation with Resistance  - 1 x daily - 5-6 x weekly - 3 sets - 10 reps - Supine Alternating Shoulder Flexion  - 1 x daily - 5-6 x weekly - 3 sets - 10 reps - Seated Shoulder Row with  Anchored Resistance  - 1 x daily - 5-6 x weekly - 3 sets - 10 reps   12/2: Access Code: [email or web address] URL: [email or web address] Date: 08/06/2023 Prepared by: Darryle Patten  Exercises - Seated Shoulder Flexion Towel Slide at Table Top  - 1 x daily - 5-7 x weekly - 2 sets - 10 reps - 15-30 sec hold - Seated Shoulder Abduction Towel Slide at Table Top  - 1 x daily - 5-7 x weekly - 2 sets - 10 reps - 15-30 sec hold  11/26: updated Access Code: [email or web address] URL: [email or web address] Date: 07/31/2023 Prepared by: Darryle Patten  Exercises - Isometric Shoulder Flexion at Wall  - 1 x daily - 7 x weekly - 2 sets - 10 reps - Isometric Shoulder Extension at Wall  - 1 x daily - 7 x weekly - 2 sets - 10 reps - Standing Isometric Shoulder Internal Rotation at Doorway  - 1 x daily - 7 x weekly - 2 sets - 10 reps - Standing Isometric Shoulder External Rotation with Doorway  - 1 x daily - 7 x weekly - 2 sets - 10 reps  Access Code: [email or web address] URL: [email or web address] Date: 07/02/2023 Prepared by: Darryle Patten  Exercises - Seated Shoulder Shrugs  - 2 x daily - 6-7 x weekly - 3 sets - 10 reps - Seated Shoulder Shrug Circles AROM Backward  - 2 x daily - 6-7 x weekly - 3 sets - 10 reps - Seated Scapular Retraction  - 1 x daily - 6-7 x weekly - 3 sets - 10 reps  ASSESSMENT:  CLINICAL IMPRESSION: Patient  was unable to tolerate much of treatment today- attempt some gentle manual stretching but did not improve pain and due to lack of overall progress- contacted her neurosurgeon office to update of her condition. Author assisted in detailing her pain report and patient now has f/u with neurosurgery office on 10/17/2023. Patient does not have another PT session until after this date so will see if any updates and instructed patient to contact PT if any updates to her plan of care from MD. She verbalized understanding.    OBJECTIVE IMPAIRMENTS: Abnormal gait, decreased endurance, decreased mobility, decreased ROM, decreased strength, hypomobility, increased edema, increased fascial restrictions, impaired perceived functional ability, increased muscle spasms, impaired flexibility, impaired UE functional use, improper body mechanics, postural dysfunction, and pain.   ACTIVITY LIMITATIONS: carrying, lifting, bed mobility, bathing, toileting, dressing, self feeding, reach over head, hygiene/grooming, and caring for others  PARTICIPATION LIMITATIONS: meal prep, cleaning, laundry, medication management, shopping, community activity, and yard work  PERSONAL FACTORS: Age, Fitness, Past/current experiences, Time since onset of injury/illness/exacerbation, and 3+ comorbidities: anemia, anxiety, breast cancer, CKD, claustrophobia, depression, migraines, OA, PONV, sleep apnea, stroke, lumbar fusion, mastectomy.   are also affecting patient's functional outcome.   REHAB POTENTIAL: Good  CLINICAL DECISION MAKING: Evolving/moderate complexity  EVALUATION COMPLEXITY: Moderate   GOALS: Goals reviewed with patient? Yes  SHORT TERM GOALS: Target date: 10/04/2023   Patient will be independent in home exercise program to improve strength/mobility for better functional independence with ADLs. Baseline: 10/23: give next session ;12/19: pt indep Goal status: MET   LONG TERM GOALS: Target date: 11/15/2023   Patient  will increase FOTO score to equal to or greater than 49    to demonstrate statistically significant improvement in mobility and quality of life.  Baseline: 32; 08/23/2023: 31 Goal status: INITIAL  2.  Patient will report a worst pain of 3/10 on VAS in L shoulder  to  improve tolerance with ADLs and reduced symptoms with activities.  Baseline: 7/10; 12/19: pt reports worst pain is a 6/10, felt in the evenings Goal status: ONGOING  3.  Patient will improve shoulder AROM to > 140 degrees of flexion, scaption, and abduction for improved ability to perform overhead activities. Baseline:12/19: flexion 160 deg; abduction ~140 pain limited Goal status: PARTIALLY MET  4.  Patient will improve shoulder strength to 4/5 for improved functional ADL and iADL performance Baseline: 89/76: unable to perform today due to pain and ROM limitations;12/19: grossly 4-/5  Goal status: ONGOING  5. Patient will increase six minute walk test distance to >1000 for progression to community ambulator and improve gait ability  Baseline: 652 ft with SPC; 12/19: 391 ft with SPC  Goal status: INITIAL  6. Patient will reduce modified Oswestry score to <50 as to demonstrate decreased disability with ADLs including improved sleeping tolerance, walking/sitting tolerance etc for better mobility with ADLs.  Baseline: 78% Goal status: NEW   7. Patient will increase BLE gross strength to 4+/5 as to improve functional strength for independent gait, increased standing tolerance and increased ADL ability. Baseline:Gross BLE strength is 4-/5, pain-limited RLE  Goal status: NEW   PLAN:  PT FREQUENCY: 2x/week  PT DURATION: 12 weeks  PLANNED INTERVENTIONS: 97164- PT Re-evaluation, 97110-Therapeutic exercises, 97530- Therapeutic activity, 97112- Neuromuscular re-education, 97535- Self Care, [id number]- Manual therapy, [id number]- Gait training, 97014- Electrical stimulation (unattended), [phone]- Electrical stimulation (manual), [id number]-  Traction (mechanical), Patient/Family education, Balance training, Taping, Joint mobilization, Spinal mobilization, Manual lymph drainage, Scar mobilization, Compression bandaging, Vestibular training, Visual/preceptual remediation/compensation, Cognitive remediation, Cryotherapy, Moist heat, and Biofeedback  PLAN FOR NEXT SESSION:  Progression of core stabilization and continuation of LE stretching.   10:53 AM, 10/15/23 Chyrl London, PT Physical Therapist - [facility]      [place], [place] 10/15/2023, 10:53 AM

## 2023-10-12 NOTE — Telephone Encounter (Signed)
 Noted.

## 2023-10-12 NOTE — Telephone Encounter (Signed)
 Chyrl (physical therapist with [facility]) wanted to inform us  that he has had 7 visits with the patient and she is not getting any better. As of today she reports her pain to be a 7/10 constant pain in her lower back traveling down to her foot. He would like for her to get reevaluated; patient agreed to come in Wednesday, 02.12.25

## 2023-10-17 ENCOUNTER — Encounter: Payer: Self-pay | Admitting: Physician Assistant

## 2023-10-17 ENCOUNTER — Ambulatory Visit: Payer: Medicaid Other | Attending: Internal Medicine | Admitting: Occupational Therapy

## 2023-10-17 ENCOUNTER — Ambulatory Visit
Admission: RE | Admit: 2023-10-17 | Discharge: 2023-10-17 | Disposition: A | Discharge status: Home / Self Care | Payer: Medicaid Other | Attending: Physician Assistant | Admitting: Physician Assistant

## 2023-10-17 ENCOUNTER — Ambulatory Visit (INDEPENDENT_AMBULATORY_CARE_PROVIDER_SITE_OTHER): Payer: Medicaid Other | Admitting: Physician Assistant

## 2023-10-17 ENCOUNTER — Ambulatory Visit
Admission: RE | Admit: 2023-10-17 | Discharge: 2023-10-17 | Disposition: A | Discharge status: Home / Self Care | Payer: Medicaid Other | Source: Ambulatory Visit | Attending: Physician Assistant | Admitting: Physician Assistant

## 2023-10-17 VITALS — BP 134/82 | Ht 64.0 in | Wt 258.0 lb

## 2023-10-17 DIAGNOSIS — M25552 Pain in left hip: Secondary | ICD-10-CM | POA: Diagnosis present

## 2023-10-17 DIAGNOSIS — Z981 Arthrodesis status: Secondary | ICD-10-CM

## 2023-10-17 DIAGNOSIS — I972 Postmastectomy lymphedema syndrome: Secondary | ICD-10-CM | POA: Diagnosis present

## 2023-10-17 DIAGNOSIS — R531 Weakness: Secondary | ICD-10-CM

## 2023-10-17 DIAGNOSIS — M4726 Other spondylosis with radiculopathy, lumbar region: Secondary | ICD-10-CM

## 2023-10-17 DIAGNOSIS — L905 Scar conditions and fibrosis of skin: Secondary | ICD-10-CM | POA: Insufficient documentation

## 2023-10-17 MED ORDER — MELOXICAM 7.5 MG PO TABS: 15.0000 mg | ORAL_TABLET | Freq: Every day | ORAL | 0 refills | Status: DC

## 2023-10-17 NOTE — Progress Notes (Signed)
Follow-up note: Referring Physician:  [name], MD [address] [place],  [place] [address]  Primary Physician:  [name], MD   History of Present Illness: Vanessa Medina is a 53 y.o. female has a past medical history of breast cancer with reconstruction in 2024.  She also has longstanding back pain and underwent a L2-5 TLIF in 2020.  She then underwent a spinal cord stimulator trial in 2021 in which she had wonderful relief for 2 weeks.  Unfortunately when they went to place the permanent there is a surgical complication and the stimulator was removed and which caused permanent pain and weakness in the right lower extremity.  She then underwent a revision of her L2-5 TLIF in December 2023.  She continues to have mid and low back pain that radiates to bilateral legs, right worse than left.  This was complicated by a fall in which she suffered 6 weeks ago when she was at her sewing table.  She has severe numbness tingling and burning extending on the outside and back of her right leg down to her toes.  She also has numbness and tingling in the back of her left leg although is not as severe.  She feels as though her pain is so extreme that she has weakness.  She uses a cane to ambulate.  The only thing to help with pain is to lean forward.  No bowel or bladder dysfunction.  No saddle anesthesia.    Interval History:  Patient comes back to clinic today with severe, worsening of back pain.  She states it starts in the middle of her thoracic spine radiates straight down to her lumbar spine then spreads out on the with of her back.  It radiates down both legs.  On her right side and radiates down the side and back of her leg into both the top and bottom of her foot with associated numbness and tingling.  In her left side in which she has a left total hip replacement she states that she has tremendous hip pain that is new and it radiates from her back to the outside of her  knee in the beginning portion of her shin, but does not go down to her feet.  She is currently using a cane to ambulate.  For pain she is taking baclofen, Cymbalta, and gabapentin.  She previously underwent a L2-5 TLIF 14 months ago.  She states that she felt good after surgery for about 2 months, but then the pain has been worsening ever since.  She denies any saddle anesthesia.  She has been undergoing physical therapy, but physical therapy has contacted [name] saying that there is nothing more they think they can do moving forward.    Review of Systems:  A 10 point review of systems is negative, except for   the pertinent positives and negatives detailed in the HPI.  Past Medical History: Past Medical History:  Diagnosis Date   Acute nontraumatic kidney injury (HCC)    Anemia    Anxiety    Benign cyst of kidney    Breast cancer (HCC) 01/18/2023   left breast   Cervical cancer (HCC)    Chronic hypotension    Chronic kidney disease    Previous now clear   Claustrophobia    Decompression injury of spinal cord    Depression    Family history of adverse reaction to anesthesia     " my mother takes a long  time time wake up"   History of dysplastic nevus 01/18/2018   right shoulder, recurrent dysplastic nevus   History of kidney stones    History of shingles    Migraine    Multiple allergies    Obesity    Osteoarthritis    left hip   Pneumonia    PONV (postoperative nausea and vomiting)    slow to wake up   Sleep apnea    does not wear CPAP   Slow to wake up after anesthesia    Stroke (HCC) 08/11/2020   spinal cord stroke, spinal cord puncture during surgery   Wears glasses     Past Surgical History: Past Surgical History:  Procedure Laterality Date   ABDOMINAL HYSTERECTOMY     APPENDECTOMY     BACK SURGERY     BILIOPANCREATIC DIVISION W DUODENAL SWITCH N/A    BREAST BIOPSY Left 12/06/2022   u/s bx,1:00 heart clip, path pending   BREAST BIOPSY Left 12/06/2022   [name] bx  2:00 ribbon clip path pending   BREAST BIOPSY Left 12/06/2022   [name] LT BREAST BX W LOC DEV EA ADD LESION IMG BX SPEC US GUIDE 12/06/2022 [facility]   BREAST BIOPSY Left 12/06/2022   [name] LT BREAST BX W LOC DEV 1ST LESION IMG BX SPEC US GUIDE 12/06/2022 [facility]   BREAST RECONSTRUCTION WITH PLACEMENT OF TISSUE EXPANDER AND FLEX HD (ACELLULAR HYDRATED DERMIS) Bilateral 01/18/2023   Procedure: IMMEDIATE LEFT BREAST RECONSTRUCTION WITH PLACEMENT OF TISSUE EXPANDER AND FLEX HD (ACELLULAR HYDRATED DERMIS);  Surgeon: Peggye Form, DO;  Location: [facility];  Service: Plastics;  Laterality: Bilateral;   BREAST RECONSTRUCTION WITH PLACEMENT OF TISSUE EXPANDER AND FLEX HD (ACELLULAR HYDRATED DERMIS) Left 03/26/2023   Procedure: BREAST RECONSTRUCTION WITH PLACEMENT OF TISSUE EXPANDER;  Surgeon: Peggye Form, DO;  Location: [facility];  Service: Plastics;  Laterality: Left;   CHOLECYSTECTOMY     COLONOSCOPY N/A 06/27/2021   Procedure: COLONOSCOPY;  Surgeon: Jaynie Collins, DO;  Location: [facility];  Service: Gastroenterology;  Laterality: N/A;   COLONOSCOPY N/A 08/27/2023   Procedure: COLONOSCOPY;  Surgeon: Jaynie Collins, DO;  Location: [facility];  Service: Gastroenterology;  Laterality: N/A;   DIAGNOSTIC LAPAROSCOPY     LOA   ESOPHAGOGASTRODUODENOSCOPY N/A 06/27/2021   Procedure: ESOPHAGOGASTRODUODENOSCOPY (EGD);  Surgeon: Jaynie Collins, DO;  Location: [facility];  Service: Gastroenterology;  Laterality: N/A;   ESOPHAGOGASTRODUODENOSCOPY (EGD) WITH PROPOFOL N/A 07/25/2017   Procedure: ESOPHAGOGASTRODUODENOSCOPY (EGD) WITH PROPOFOL;  Surgeon: Scot Jun, MD;  Location: [facility]OSCOPY;  Service: Endoscopy;  Laterality: N/A;   FLEXIBLE BRONCHOSCOPY     HERNIA REPAIR     KNEE ARTHROSCOPY     LAPAROSCOPIC GASTRIC SLEEVE RESECTION WITH HIATAL HERNIA REPAIR     LUMBAR FUSION  08/29/2022   Revision Lumbar two through five  Laminectomy & Fusion with Transforaminal Lumbar interbody fusion   MASTECTOMY W/ SENTINEL NODE BIOPSY Bilateral 01/18/2023   Procedure: MASTECTOMY WITH SENTINEL LYMPH NODE BIOPSY, RNFA to assist;  Surgeon: Leafy Ro, MD;  Location: [facility];  Service: General;  Laterality: Bilateral;   REMOVAL OF TISSUE EXPANDER AND PLACEMENT OF IMPLANT Bilateral 06/06/2023   Procedure: REMOVAL OF TISSUE EXPANDER AND PLACEMENT OF IMPLANT;  Surgeon: Peggye Form, DO;  Location: [facility];  Service: Plastics;  Laterality: Bilateral;   SPINAL CORD STIMULATOR IMPLANT     TONSILLECTOMY     TOTAL HIP ARTHROPLASTY Left 01/26/2016   Procedure: LEFT TOTAL  HIP ARTHROPLASTY ANTERIOR APPROACH;  Surgeon: Tarry Kos, MD;  Location: [facility];  Service: Orthopedics;  Laterality: Left;   TRANSFORAMINAL LUMBAR INTERBODY FUSION (TLIF) WITH PEDICLE SCREW FIXATION 3 LEVEL  08/2019   Vira Browns, MD L2-L5    Allergies: Allergies as of 10/17/2023 - Review Complete 10/17/2023  Allergen Reaction Noted   Bee venom Swelling 08/15/2022   Iodinated contrast media Anaphylaxis 01/19/2016   Latex Hives and Rash 10/12/2022   Morphine Other (See Comments) 11/10/2022   Shellfish allergy Anaphylaxis 01/25/2016   Codeine Nausea And Vomiting 11/10/2022   Meperidine hcl Other (See Comments)    Bee pollen Other (See Comments) 01/25/2016   Oxycodone Hives and Itching 08/12/2020   Tape Hives 03/15/2023   Pentazocine lactate Nausea And Vomiting    Propoxyphene Nausea Only and Nausea And Vomiting 01/07/2018    Medications: Outpatient Encounter Medications as of 10/17/2023  Medication Sig   azelastine (ASTELIN) 0.1 % nasal spray Place 1 spray into both nostrils 2 (two) times daily. Use in each nostril as directed   baclofen (LIORESAL) 10 MG tablet Take 1 tablet (10 mg total) by mouth 3 (three) times daily. - for spasms   colestipol (COLESTID) 1 g tablet Take by mouth.   diclofenac Sodium (VOLTAREN) 1 % GEL Apply 2  g topically 4 (four) times daily.   DULoxetine (CYMBALTA) 60 MG capsule Take 2 capsules (120 mg total) by mouth at bedtime.   EPINEPHrine 0.3 mg/0.3 mL IJ SOAJ injection    fluticasone (FLONASE) 50 MCG/ACT nasal spray Place 2 sprays into both nostrils daily.   furosemide (LASIX) 20 MG tablet Take 20 mg by mouth daily as needed.   gabapentin (NEURONTIN) 300 MG capsule Take 1 capsule (300 mg total) by mouth at bedtime. For nerve pain   letrozole (FEMARA) 2.5 MG tablet Take 1 tablet (2.5 mg total) by mouth daily.   meloxicam (MOBIC) 7.5 MG tablet Take 2 tablets (15 mg total) by mouth daily.   methocarbamol (ROBAXIN) 500 MG tablet Take 500 mg by mouth 4 (four) times daily.   montelukast (SINGULAIR) 10 MG tablet TAKE 1 TABLET BY MOUTH AT  BEDTIME FOR ALLERGIES   Multiple Vitamin (MULTIVITAMIN WITH MINERALS) TABS tablet Take 1 tablet by mouth 2 (two) times daily.   pramipexole (MIRAPEX) 0.125 MG tablet Take by mouth.   pyridOXINE (VITAMIN B-6) 100 MG tablet Take 1 tablet (100 mg total) by mouth daily.   thiamine 250 MG tablet Take 250 mg by mouth daily.   tolterodine (DETROL LA) 4 MG 24 hr capsule Take 1 capsule (4 mg total) by mouth daily.   topiramate (TOPAMAX) 100 MG tablet TAKE 1 TABLET BY MOUTH AT  BEDTIME   traMADol (ULTRAM) 50 MG tablet Take 1 tablet (50 mg total) by mouth every 12 (twelve) hours as needed.   vitamin B-12 (CYANOCOBALAMIN) 1000 MCG tablet Take 1 tablet (1,000 mcg total) by mouth daily.   [DISCONTINUED] diazepam (VALIUM) 2 MG tablet Take 1 tablet (2 mg total) by mouth every 12 (twelve) hours as needed for up to 5 doses for muscle spasms.   No facility-administered encounter medications on file as of 10/17/2023.    Social History: Social History   Tobacco Use   Smoking status: Never    Passive exposure: Never   Smokeless tobacco: Never  Vaping Use   Vaping status: Never Used  Substance Use Topics   Alcohol use: Not Currently    Comment: occasional wine   Drug use: No  Family Medical History: Family History  Problem Relation Age of Onset   Renal Disease Mother    Hypertension Mother    Sudden Cardiac Death Mother    Heart failure Mother    Valvular heart disease Mother    Heart disease Mother    Breast cancer Maternal Grandmother    Stroke Brother    Heart attack Brother 52   Diabetes Other    E achilles reflex better than right then left, oppsotie at knees.    Exam:   Awake, alert, oriented to person, place, and time.  Speech is clear and fluent. Fund of knowledge is appropriate.    Cranial Nerves: Pupils equal round and reactive to light.  Facial tone is symmetric.   ROM of spine: Decreased range of motion of lumbar spine.  Patient is tender to palpation of her thoracic and lumbar spine.   Strength:   Side Iliopsoas Quads Hamstring PF DF EHL  R 4 5 4  4-4+ 4-4+ 4-4+  L 3 5 4 5 5 5       Difficult examination of bilateral lower extremities secondary to patient's pain.  Patient also has a tremor in bilateral extremities.   Difficulty obtaining reflexes of her lower extremity. 2+ Patellar reflex on the left, 1+ on the right. 1+achilles reflex on the right, absent on the left.  Unable to obtain hamstring reflex. Clonus is not present.  Toes are down-going.  Patient does have some decreased sensation to bilateral lower extremities Gait is altered and patient ambulates with a cane  Imaging:  MRI Thoracic spine (09/21/23): IMPRESSION: 1. No evidence of high-grade spinal canal or neural foraminal stenosis. 2. Narrowing of the dorsal epidural space at the T8-T11 levels secondary to ligamentum flavum hypertrophy. 3. Minimal T2 hyperintense signal abnormality and contrast enhancement in the left lamina at T6. While this finding is most likely degenerative in nature, a follow-up thoracic spine MRI in 1-2 months is recommended to assess for interval change and exclude the possibility of malignancy     I have personally reviewed the  images and agree with the above interpretation.  Assessment and Plan: Vanessa Medina is a pleasant 53 y.o. female has a past medical history of breast cancer with reconstruction in 2024.  She also has longstanding back pain and underwent a L2-5 TLIF in 2020.  She then underwent a spinal cord stimulator trial in 2021 in which she had wonderful relief for 2 weeks.  Unfortunately when they went to place the permanent there is a surgical complication and the stimulator was removed and which caused permanent pain and weakness in the right lower extremity.  She then underwent a revision of her L2-5 TLIF in December 2023.  She continues to have mid and low back pain that radiates to bilateral legs, right worse than left. She comes in today with severe, worsening of back pain.  She states it starts in the middle of her thoracic spine radiates straight down to her lumbar spine then spreads out on the with of her back.  It radiates down both legs.  On her right side and radiates down the side and back of her leg into both the top and bottom of her foot with associated numbness and tingling.  In her left side in which she has a left total hip replacement she states that she has tremendous hip pain that is new and it radiates from her back to the outside of her knee in the beginning portion of her shin, but  does not go down to her feet.  She is currently using a cane to ambulate.  For pain she is taking baclofen, Cymbalta, and gabapentin.  She previously underwent a L2-5 TLIF 14 months ago.  She states that she felt good after surgery for about 2 months, but then the pain has been worsening ever since.  She denies any saddle anesthesia.  She has been undergoing physical therapy, but physical therapy has contacted [name] saying that there is nothing more they think they can do moving forward.  Plan:  -Lumbar flex/ex x-rays today -Left hip x-ray -Lumbar MRI and repeat thoracic MRI due to possible malignancy seen on T2 signal at  T6 left lamina -Continue current pain regimen + Mobic  Will plan on reviewing imaging once complete.  Conservative therapy has not been effective at relieving the symptoms in this patient.  We did discuss repeating a spinal cord stimulator versus needing to do any sort of revision to her current hardware.  Once her MRI is complete we will review next steps.  She is in quite a bit of pain and is eager for relief.  Joan Flores PA-C Neurosurgery

## 2023-10-17 NOTE — Therapy (Signed)
[facility] [address] [place], [place], [address] Phone: [phone]   Fax:  [phone]  Occupational Therapy Treatment  Patient Details  Name: Vanessa Medina MRN: [id number] Date of Birth: [birth year] No data recorded  Encounter Date: 10/17/2023   OT End of Session - 10/17/23 1807     Visit Number 6    Number of Visits 12    Date for OT Re-Evaluation 10/17/23    OT Start Time 1402    OT Stop Time 1434    OT Time Calculation (min) 32 min    Activity Tolerance Patient tolerated treatment well    Behavior During Therapy [facility] for tasks assessed/performed             Past Medical History:  Diagnosis Date   Acute nontraumatic kidney injury (HCC)    Anemia    Anxiety    Benign cyst of kidney    Breast cancer (HCC) 01/18/2023   left breast   Cervical cancer (HCC)    Chronic hypotension    Chronic kidney disease    Previous now clear   Claustrophobia    Decompression injury of spinal cord    Depression    Family history of adverse reaction to anesthesia     " my mother takes a long time time wake up"   History of dysplastic nevus 01/18/2018   right shoulder, recurrent dysplastic nevus   History of kidney stones    History of shingles    Migraine    Multiple allergies    Obesity    Osteoarthritis    left hip   Pneumonia    PONV (postoperative nausea and vomiting)    slow to wake up   Sleep apnea    does not wear CPAP   Slow to wake up after anesthesia    Stroke (HCC) 08/11/2020   spinal cord stroke, spinal cord puncture during surgery   Wears glasses     Past Surgical History:  Procedure Laterality Date   ABDOMINAL HYSTERECTOMY     APPENDECTOMY     BACK SURGERY     BILIOPANCREATIC DIVISION W DUODENAL SWITCH N/A    BREAST BIOPSY Left 12/06/2022   u/s bx,1:00 heart clip, path pending   BREAST BIOPSY Left 12/06/2022   [name] bx 2:00 ribbon clip path pending   BREAST BIOPSY Left 12/06/2022   [name] LT  BREAST BX W LOC DEV EA ADD LESION IMG BX SPEC US GUIDE 12/06/2022 [facility]   BREAST BIOPSY Left 12/06/2022   [name] LT BREAST BX W LOC DEV 1ST LESION IMG BX SPEC US GUIDE 12/06/2022 [facility]   BREAST RECONSTRUCTION WITH PLACEMENT OF TISSUE EXPANDER AND FLEX HD (ACELLULAR HYDRATED DERMIS) Bilateral 01/18/2023   Procedure: IMMEDIATE LEFT BREAST RECONSTRUCTION WITH PLACEMENT OF TISSUE EXPANDER AND FLEX HD (ACELLULAR HYDRATED DERMIS);  Surgeon: Peggye Form, DO;  Location: [facility];  Service: Plastics;  Laterality: Bilateral;   BREAST RECONSTRUCTION WITH PLACEMENT OF TISSUE EXPANDER AND FLEX HD (ACELLULAR HYDRATED DERMIS) Left 03/26/2023   Procedure: BREAST RECONSTRUCTION WITH PLACEMENT OF TISSUE EXPANDER;  Surgeon: Peggye Form, DO;  Location: [facility];  Service: Plastics;  Laterality: Left;   CHOLECYSTECTOMY     COLONOSCOPY N/A 06/27/2021   Procedure: COLONOSCOPY;  Surgeon: Jaynie Collins, DO;  Location: [facility];  Service: Gastroenterology;  Laterality: N/A;   COLONOSCOPY N/A 08/27/2023   Procedure: COLONOSCOPY;  Surgeon: Jaynie Collins, DO;  Location: [facility];  Service: Gastroenterology;  Laterality: N/A;   DIAGNOSTIC LAPAROSCOPY     LOA   ESOPHAGOGASTRODUODENOSCOPY N/A 06/27/2021   Procedure: ESOPHAGOGASTRODUODENOSCOPY (EGD);  Surgeon: Jaynie Collins, DO;  Location: [facility] END[facility]COPY;  Service: Gastroenterology;  Laterality: N/A;   ESOPHAGOGASTRODUODENOSCOPY (EGD) WITH PROPOFOL N/A 07/25/2017   Procedure: ESOPHAGOGASTRODUODENOSCOPY (EGD) WITH PROPOFOL;  Surgeon: Scot Jun, MD;  Location: [facility]COPY;  Service: Endoscopy;  Laterality: N/A;   FLEXIBLE BRONCHOSCOPY     HERNIA REPAIR     KNEE ARTHROSCOPY     LAPAROSCOPIC GASTRIC SLEEVE RESECTION WITH HIATAL HERNIA REPAIR     LUMBAR FUSION  08/29/2022   Revision Lumbar two through five Laminectomy & Fusion with Transforaminal Lumbar interbody fusion   MASTECTOMY W/  SENTINEL NODE BIOPSY Bilateral 01/18/2023   Procedure: MASTECTOMY WITH SENTINEL LYMPH NODE BIOPSY, RNFA to assist;  Surgeon: Leafy Ro, MD;  Location: [facility];  Service: General;  Laterality: Bilateral;   REMOVAL OF TISSUE EXPANDER AND PLACEMENT OF IMPLANT Bilateral 06/06/2023   Procedure: REMOVAL OF TISSUE EXPANDER AND PLACEMENT OF IMPLANT;  Surgeon: Peggye Form, DO;  Location: [facility];  Service: Plastics;  Laterality: Bilateral;   SPINAL CORD STIMULATOR IMPLANT     TONSILLECTOMY     TOTAL HIP ARTHROPLASTY Left 01/26/2016   Procedure: LEFT TOTAL HIP ARTHROPLASTY ANTERIOR APPROACH;  Surgeon: Tarry Kos, MD;  Location[facility];  Service: Orthopedics;  Laterality: Left;   TRANSFORAMINAL LUMBAR INTERBODY FUSION (TLIF) WITH PEDICLE SCREW FIXATION 3 LEVEL  08/2019   Vira Browns, MD L2-L5    SUBJECTIVE: Since I seen you last time was last week in [place].  It was so nice just to get away.  I did wear my compression sleeve on my arm every day.  The Jovi pack I wore also every day.  At least 4 hours.  Feels better.  Still discomfort on my left breast.              Patient arrived with over-the-counter [employer] sleeve in place on left upper extremity.  Patient has been wearing it for about 12 wks 5 to 7 days week - 6-8 hours during the day. In send seen 3 weeks ago was also wearing her Jovi pack at least 5 days a week. Upon assessing circumference about the same than November 24.  Compression sleeve on the left maintaining her circumference.   Heaviness and fullness in left breast improved.  Still some congestion on the left lateral breast.   Recommend for patient to wear Jovi pack with high risk activities that increases fullness heaviness.   Recommend for patient to wear compression sleeve on left upper extremity until about June.  Patient to follow-up or contact me around early June when her garment is about 53 months old.  For reassessment.  Follow-up with  surgeon as in July.                       OT Long Term Goals - 07/11/23 1819       OT LONG TERM GOAL #1   Title Patient to be independent in home program to increase active range of motion in bilateral shoulders with functional limb at with pain less than a 2/10 pain    Baseline Patient followed by physical therapy    Status Deferred      OT LONG TERM GOAL #2   Title Monitor and prevent lymphedema in bilateral upper extremities and thoracic and assess if  needed compression.    Baseline Patient is right-hand dominant.  Do show increased swelling in the left upper arm as well as chest but has a ruptured expander at the moment. NOW patient return after being seen in June.  Patient had multiple surgeries to the left breast.  Increased lymphedema left breast, thoracic and upper arm.  Recommend compression with continue to monitor progress with home program    Time 12    Period Weeks    Status MET   Target Date      OT LONG TERM GOAL #3   Title Bilateral shoulder strength improved for patient to pull overhead shirt and reach overhead with 2 pound weight independently    Baseline Patient get physical therapy    Status Deferred      OT LONG TERM GOAL #4   Title Bilateral shoulder external rotation for patient to be able to wash and do hair with no increase symptoms.    Baseline Patient receiving physical therapy    Status Deferred                     Patient will benefit from skilled therapeutic intervention in order to improve the following deficits and impairments:           Visit Diagnosis: Plan - 09/19/23      Clinical Impression Statement Pt refer to OT after being seen the last time in June 24 -  post bilateral mastectomy with immediate reconstruction using tissue expanders and Flex HD performed on 01/18/2023 by Dr. Ulice Bold and Dr. Everlene Farrier. Breast CA diagnosis on the L.  Pt report had multiple surgeries to L breast since seen OT in June- and in PT  for bilateral shoulder stiffness. Patient is right-hand dominant.  Pt  had show increase swelling and lymphedema in lateral L breast, axilla and thorasic - with increase of circumference in L UE -  Pt had been wearing over the counter compression sleeve and jovipak for 12 wks sleeve and jovipak 4-6 wks - - doing well keeping L UE and upper thoracic circumference  decreased - Pt can cont with home program to maintain lymphedema symptoms patient can follow-up and contact me in about early June when her compression is due for replacement for reassessment.      OT Occupational Profile and History Problem Focused Assessment - Including review of records relating to presenting problem     Occupational performance deficits (Please refer to evaluation for details): ADL's;IADL's;Rest and Sleep;Social Participation;Leisure     Body Structure / Function / Physical Skills ADL;Decreased knowledge of precautions;Flexibility;ROM;UE functional use;Scar mobility;IADL;Pain;Skin integrity     Rehab Potential Good     Clinical Decision Making Several treatment options, min-mod task modification necessary     Comorbidities Affecting Occupational Performance: May have comorbidities impacting occupational performance     Modification or Assistance to Complete Evaluation  Min-Moderate modification of tasks or assist with assess necessary to complete eval     OT Frequency 1 x wk    OT Duration 1 wks    OT Treatment/Interventions Self-care/ADL training;Manual lymph drainage;Therapeutic exercise;Patient/family education;DME and/or AE instruction;Manual Therapy;Scar mobilization;Therapeutic activities     Consulted and Agree with Plan of Care Patient                   Patient will benefit from skilled therapeutic intervention in order to improve the following deficits and impairments:   Body Structure / Function / Physical Skills: ADL, Decreased knowledge of precautions,  Flexibility, ROM, UE functional use, Scar mobility,  IADL, Pain, Skin integrity     Visit Diagnosis: Post-mastectomy lymphedema syndrome   Chest pain, muscular       Problem List      Patient Active Problem List    Diagnosis Date Noted   Long-term current use of tamoxifen 06/26/2023   Claustrophobia     Pernicious anemia 03/14/2023   Ruptured left breast implant 02/13/2023   S/P mastectomy, bilateral 01/18/2023   Genetic testing 12/18/2022   Breast cancer (HCC) 12/08/2022   Family history of breast cancer 12/08/2022   Profound fatigue 12/08/2022   Lumbar post-laminectomy syndrome 06/02/2022   Pseudarthrosis after fusion or arthrodesis 05/19/2022   Primary osteoarthritis of right knee 03/28/2022   Subjective tinnitus of both ears 12/21/2021   Abnormality of gait 04/29/2021   Abnormal sensation in both ears 04/06/2021   Other adverse food reactions, not elsewhere classified, subsequent encounter 04/06/2021   Multiple drug allergies 04/06/2021   Insomnia due to medical condition 02/25/2021   Myofascial muscle pain 02/25/2021   Nerve pain 10/18/2020   Incomplete paraplegia (HCC) 10/18/2020   Orthostatic hypotension 10/18/2020   Renal cyst 09/23/2020   Chronic migraine without aura without status migrainosus, not intractable     Hypokalemia     Adjustment reaction with anxiety and depression     Spinal cord injury, lumbar, without spinal bone injury, sequela (HCC) 08/18/2020   Paraparesis (HCC)     Post-operative pain     Hypotension     Slow transit constipation     AKI (acute kidney injury) (HCC)     Right leg weakness 08/11/2020   DDD (degenerative disc disease), lumbar 09/02/2019      Class: Chronic   Degenerative disc disease, lumbar 09/02/2019   Chest tightness 09/23/2018   Bradycardia 09/23/2018   BMI 40.0-44.9, adult (HCC) 04/17/2018   Status post bariatric surgery 04/17/2018   Labile blood pressure 03/08/2018   Chronic low back pain 09/03/2017   History of total hip replacement, left 01/26/2016    Osteoarthritis of spine with radiculopathy, lumbar region 10/16/2014   EDEMA 04/28/2008   LEG PAIN, BILATERAL 12/16/2007   Malignant neoplasm of cervix uteri (HCC) 07/02/2007   MORBID OBESITY 07/02/2007   DEPRESSION 07/02/2007   Migraine without aura 07/02/2007   Other allergic rhinitis 07/02/2007   Asthma 07/02/2007   GERD 07/02/2007   ELEVATED BLOOD PRESSURE WITHOUT DIAGNOSIS OF HYPERTENSION 07/02/2007   Asthma 07/02/2007      Oletta Cohn, OTR/L,CLT 10/17/2023, 5:45 PM   [facility] [address] [place], [address] Phone: [phone]   Fax:  [phone]   Name: HAVEN PYLANT MRN: [id number] Date of Birth: 04/08/1971

## 2023-10-18 NOTE — Addendum Note (Signed)
Addended by: Oletta Cohn on: 10/18/2023 08:14 AM   Modules accepted: Orders

## 2023-10-22 NOTE — Therapy (Signed)
OUTPATIENT PHYSICAL THERAPY TREATMENT     Patient Name: Vanessa Medina MRN: [id number] DOB:[birth year], 53 y.o., female Today's Date: 10/23/2023  END OF SESSION:  PT End of Session - 10/23/23 1444     Visit Number 18    Number of Visits 34    Date for PT Re-Evaluation 11/15/23    Authorization Type [place] Medicaid [employer]    Progress Note Due on Visit 20    PT Start Time 1445    PT Stop Time 1529    PT Time Calculation (min) 44 min    Activity Tolerance Patient tolerated treatment well;Patient limited by fatigue;Patient limited by pain    Behavior During Therapy [facility] for tasks assessed/performed                Past Medical History:  Diagnosis Date   Acute nontraumatic kidney injury (HCC)    Anemia    Anxiety    Benign cyst of kidney    Breast cancer (HCC) 01/18/2023   left breast   Cervical cancer (HCC)    Chronic hypotension    Chronic kidney disease    Previous now clear   Claustrophobia    Decompression injury of spinal cord    Depression    Family history of adverse reaction to anesthesia     " my mother takes a long time time wake up"   History of dysplastic nevus 01/18/2018   right shoulder, recurrent dysplastic nevus   History of kidney stones    History of shingles    Migraine    Multiple allergies    Obesity    Osteoarthritis    left hip   Pneumonia    PONV (postoperative nausea and vomiting)    slow to wake up   Sleep apnea    does not wear CPAP   Slow to wake up after anesthesia    Stroke (HCC) 08/11/2020   spinal cord stroke, spinal cord puncture during surgery   Wears glasses    Past Surgical History:  Procedure Laterality Date   ABDOMINAL HYSTERECTOMY     APPENDECTOMY     BACK SURGERY     BILIOPANCREATIC DIVISION W DUODENAL SWITCH N/A    BREAST BIOPSY Left 12/06/2022   u/s bx,1:00 heart clip, path pending   BREAST BIOPSY Left 12/06/2022   [name] bx 2:00 ribbon clip path pending   BREAST BIOPSY Left 12/06/2022   [name] LT BREAST BX W  LOC DEV EA ADD LESION IMG BX SPEC US GUIDE 12/06/2022 [facility]   BREAST BIOPSY Left 12/06/2022   [name] LT BREAST BX W LOC DEV 1ST LESION IMG BX SPEC US GUIDE 12/06/2022 [facility]   BREAST RECONSTRUCTION WITH PLACEMENT OF TISSUE EXPANDER AND FLEX HD (ACELLULAR HYDRATED DERMIS) Bilateral 01/18/2023   Procedure: IMMEDIATE LEFT BREAST RECONSTRUCTION WITH PLACEMENT OF TISSUE EXPANDER AND FLEX HD (ACELLULAR HYDRATED DERMIS);  Surgeon: Peggye Form, DO;  Location: [facility];  Service: Plastics;  Laterality: Bilateral;   BREAST RECONSTRUCTION WITH PLACEMENT OF TISSUE EXPANDER AND FLEX HD (ACELLULAR HYDRATED DERMIS) Left 03/26/2023   Procedure: BREAST RECONSTRUCTION WITH PLACEMENT OF TISSUE EXPANDER;  Surgeon: Peggye Form, DO;  Location: [facility];  Service: Plastics;  Laterality: Left;   CHOLECYSTECTOMY     COLONOSCOPY N/A 06/27/2021   Procedure: COLONOSCOPY;  Surgeon: Jaynie Collins, DO;  Location: [facility];  Service: Gastroenterology;  Laterality: N/A;   COLONOSCOPY N/A 08/27/2023   Procedure: COLONOSCOPY;  Surgeon: Jaynie Collins, DO;  Location: [facility]  ENDOSCOPY;  Service: Gastroenterology;  Laterality: N/A;   DIAGNOSTIC LAPAROSCOPY     LOA   ESOPHAGOGASTRODUODENOSCOPY N/A 06/27/2021   Procedure: ESOPHAGOGASTRODUODENOSCOPY (EGD);  Surgeon: Jaynie Collins, DO;  Location: [facility] ENDOSCOPY;  Service: Gastroenterology;  Laterality: N/A;   ESOPHAGOGASTRODUODENOSCOPY (EGD) WITH PROPOFOL N/A 07/25/2017   Procedure: ESOPHAGOGASTRODUODENOSCOPY (EGD) WITH PROPOFOL;  Surgeon: Scot Jun, MD;  Location: [facility] ENDOSCOPY;  Service: Endoscopy;  Laterality: N/A;   FLEXIBLE BRONCHOSCOPY     HERNIA REPAIR     KNEE ARTHROSCOPY     LAPAROSCOPIC GASTRIC SLEEVE RESECTION WITH HIATAL HERNIA REPAIR     LUMBAR FUSION  08/29/2022   Revision Lumbar two through five Laminectomy & Fusion with Transforaminal Lumbar interbody fusion   MASTECTOMY W/ SENTINEL  NODE BIOPSY Bilateral 01/18/2023   Procedure: MASTECTOMY WITH SENTINEL LYMPH NODE BIOPSY, RNFA to assist;  Surgeon: Leafy Ro, MD;  Location: [facility];  Service: General;  Laterality: Bilateral;   REMOVAL OF TISSUE EXPANDER AND PLACEMENT OF IMPLANT Bilateral 06/06/2023   Procedure: REMOVAL OF TISSUE EXPANDER AND PLACEMENT OF IMPLANT;  Surgeon: Peggye Form, DO;  Location: [facility];  Service: Plastics;  Laterality: Bilateral;   SPINAL CORD STIMULATOR IMPLANT     TONSILLECTOMY     TOTAL HIP ARTHROPLASTY Left 01/26/2016   Procedure: LEFT TOTAL HIP ARTHROPLASTY ANTERIOR APPROACH;  Surgeon: Tarry Kos, MD;  Location: [facility];  Service: Orthopedics;  Laterality: Left;   TRANSFORAMINAL LUMBAR INTERBODY FUSION (TLIF) WITH PEDICLE SCREW FIXATION 3 LEVEL  08/2019   Vira Browns, MD L2-L5   Patient Active Problem List   Diagnosis Date Noted   Pain and swelling of lower leg 09/25/2023   Lymphedema 09/25/2023   Long term (current) use of aromatase inhibitors 09/07/2023   At risk for loss of bone density 09/07/2023   Changing skin lesion 09/04/2023   Frequent falls 08/22/2023   Chronic pain syndrome 08/22/2023   Bilateral lower extremity edema 08/06/2023   Back pain 08/06/2023   Claustrophobia    Pernicious anemia 03/14/2023   Ruptured left breast implant 02/13/2023   S/P mastectomy, bilateral 01/18/2023   Genetic testing 12/18/2022   Breast cancer (HCC) 12/08/2022   Family history of breast cancer 12/08/2022   Profound fatigue 12/08/2022   Lumbar post-laminectomy syndrome 06/02/2022   Pseudarthrosis after fusion or arthrodesis 05/19/2022   Primary osteoarthritis of right knee 03/28/2022   Subjective tinnitus of both ears 12/21/2021   Abnormality of gait 04/29/2021   Abnormal sensation in both ears 04/06/2021   Other adverse food reactions, not elsewhere classified, subsequent encounter 04/06/2021   Multiple drug allergies 04/06/2021   Insomnia due to medical  condition 02/25/2021   Myofascial muscle pain 02/25/2021   Nerve pain 10/18/2020   Incomplete paraplegia (HCC) 10/18/2020   Orthostatic hypotension 10/18/2020   Renal cyst 09/23/2020   Chronic migraine without aura without status migrainosus, not intractable    Hypokalemia    Adjustment reaction with anxiety and depression    Spinal cord injury, lumbar, without spinal bone injury, sequela (HCC) 08/18/2020   Paraparesis (HCC)    Post-operative pain    Hypotension    Slow transit constipation    AKI (acute kidney injury) (HCC)    Right leg weakness 08/11/2020   DDD (degenerative disc disease), lumbar 09/02/2019    Class: Chronic   Degenerative disc disease, lumbar 09/02/2019   Chest tightness 09/23/2018   Bradycardia 09/23/2018   BMI 40.0-44.9, adult (HCC) 04/17/2018   Status post bariatric surgery  04/17/2018   Labile blood pressure 03/08/2018   Chronic low back pain 09/03/2017   History of total hip replacement, left 01/26/2016   Osteoarthritis of spine with radiculopathy, lumbar region 10/16/2014   LEG PAIN, BILATERAL 12/16/2007   Malignant neoplasm of cervix uteri (HCC) 07/02/2007   MORBID OBESITY 07/02/2007   DEPRESSION 07/02/2007   Migraine without aura 07/02/2007   Other allergic rhinitis 07/02/2007   Asthma 07/02/2007   GERD 07/02/2007   ELEVATED BLOOD PRESSURE WITHOUT DIAGNOSIS OF HYPERTENSION 07/02/2007   Asthma 07/02/2007    PCP: [name] MD  REFERRING PROVIDER: Caroline More  REFERRING DIAG: s/p breast reconstruction  THERAPY DIAG:  Muscle weakness (generalized)  Unsteadiness on feet  Difficulty in walking, not elsewhere classified  Other low back pain  Chronic left shoulder pain  Rationale for Evaluation and Treatment: Rehabilitation  ONSET DATE: December 07 2022  SUBJECTIVE:                                                                                                                                                                                       SUBJECTIVE STATEMENT: Patient waiting for call to schedule MRI, has had two falls since seen last.    Hand dominance: Right  PERTINENT HISTORY: 08/23/2023:  Pt with new orders to address back pain prior to possible surgery for [facility] stimulator. Pt with hx of multiple back surgeries with chronic pain and now worsening pain with decline in mobility. Worst pain reaches 10/10, best 4/10. Pt ideally would like to improve pain. She would also like to improve her ability to walk, reports has not been able to go for walks in a while. Was ambulating without an AD for a while but now must use SPC again. She reports ability to stand, such as to do dishes, is limited due to pain. Pt experiences radiating pain down RLE into R foot and toes. Hx: L2-5 TLIF in 2020, L2-5 TLIF revision in December 2023.  Patient is having LUE ROM issues s/p breast cancer implant reconstruction. Patient underwent bilateral exchange of tissue expanders for implants, bilateral capsulotomies for implant repositioning and right breast capsulectomy with major repositioning of 3 x 8 cm of capsule on 06/06/2023. Patient had back surgery December 26th 2023 and then was diagnosed with breast cancer December 07, 2022. Had surgery Jan 18, 2023 for her breast cancer, had double mastectomy. Patient had two sets of expanders, first one burst, the second one kept flipping and moving. Had to have more work on L side. PMH includes: anemia, anxiety, breast cancer, CKD, claustrophobia, depression, migraines, OA, PONV, sleep apnea, stroke, lumbar fusion, mastectomy.   PAIN:  Are you having  pain? 7/10 central lower thoracic to coccyx area.      PRECAUTIONS: Other:   , pt reports LATEX ALLERGY   RED FLAGS: -no saddle numbness, chronic known incontinence;   WEIGHT BEARING RESTRICTIONS: Pt unaware of any current lifting restrictions anymore  FALLS:  Has patient fallen in last 6 months? Yes. Number of falls 1  LIVING ENVIRONMENT: Lives with:  lives with their family Lives in: House/apartment Stairs: Yes: External: 2 steps; on right going up and on left going up Has following equipment at home: Single point cane, Walker - 2 wheeled, shower chair, and Grab bars  OCCUPATION: Disability  --> retirement   PLOF: Independent with basic ADLs  PATIENT GOALS:  Reduce pain in her back, improve ability to regain mobility, reduce falls   NEXT MD VISIT:   OBJECTIVE:  Note: Objective measures were completed at Evaluation unless otherwise noted.  DIAGNOSTIC FINDINGS:  N/a   PATIENT SURVEYS:  FOTO 32  COGNITION: Overall cognitive status:  some short term memory      SENSATION: Paresthesias in BLE, RLE more distal than left    LE MMT: 08/23/23 Gross BLE strength is 4-/5, pain-limited RLE    TODAY'S TREATMENT:                                                                                                                                         DATE: 10/23/23   THEREX: Hamstring stretch 60 seconds each LE Single knee to chest 60 seconds each LE Figure four modified 60 seconds each LE Rotation 60 seconds x multiple trials SAQ 15x each LE; 2 sets  Therapeutic activities:  TrA bracing in hooklye - hold 5 sec x 15 reps Adduction ball squeeze with core activation 15x 3 seconds Single leg abduction to 45 degrees, return to neutral 10x Ambulate with SPC with focus on step length and pain reduction technique   -hooklying to sitting EOB supervision using correct log roll technique  -static stand with focus on breathing for pain reduction  PATIENT EDUCATION: Education details: goals, plan Person educated: Patient Education method: Explanation, Demonstration, Tactile cues, and Verbal cues Education comprehension: verbalized understanding, returned demonstration, verbal cues required, and tactile cues required  HOME EXERCISE PROGRAM:  PREV Access Code: [email or web address] URL: [email or web address] Date:  08/16/2023 Prepared by: Temple Pacini  Exercises - Standing Shoulder External Rotation with Resistance  - 1 x daily - 5-6 x weekly - 3 sets - 10 reps - Supine Alternating Shoulder Flexion  - 1 x daily - 5-6 x weekly - 3 sets - 10 reps - Seated Shoulder Row with Anchored Resistance  - 1 x daily - 5-6 x weekly - 3 sets - 10 reps   12/2: Access Code: [email or web address] URL: [email or web address] Date: 08/06/2023 Prepared by: Temple Pacini  Exercises - Seated Shoulder Flexion Towel Slide at Table Top  - 1 x daily - 5-7 x weekly - 2  sets - 10 reps - 15-30 sec hold - Seated Shoulder Abduction Towel Slide at Table Top  - 1 x daily - 5-7 x weekly - 2 sets - 10 reps - 15-30 sec hold  11/26: updated Access Code: [email or web address] URL: [email or web address] Date: 07/31/2023 Prepared by: Temple Pacini  Exercises - Isometric Shoulder Flexion at Wall  - 1 x daily - 7 x weekly - 2 sets - 10 reps - Isometric Shoulder Extension at Wall  - 1 x daily - 7 x weekly - 2 sets - 10 reps - Standing Isometric Shoulder Internal Rotation at Doorway  - 1 x daily - 7 x weekly - 2 sets - 10 reps - Standing Isometric Shoulder External Rotation with Doorway  - 1 x daily - 7 x weekly - 2 sets - 10 reps  Access Code: [email or web address] URL: [email or web address] Date: 07/02/2023 Prepared by: Temple Pacini  Exercises - Seated Shoulder Shrugs  - 2 x daily - 6-7 x weekly - 3 sets - 10 reps - Seated Shoulder Shrug Circles AROM Backward  - 2 x daily - 6-7 x weekly - 3 sets - 10 reps - Seated Scapular Retraction  - 1 x daily - 6-7 x weekly - 3 sets - 10 reps  ASSESSMENT:  CLINICAL IMPRESSION: Patient's back pain significantly limits functional performance this session. She is highly motivated throughout session despite pain. She is eager to progress her pain free mobility, educated on need to call physicians office due to not hearing back about scheduling MRI yet. Patient will benefit from skilled physical  therapy to reduce pain and improve mobility.   OBJECTIVE IMPAIRMENTS: Abnormal gait, decreased endurance, decreased mobility, decreased ROM, decreased strength, hypomobility, increased edema, increased fascial restrictions, impaired perceived functional ability, increased muscle spasms, impaired flexibility, impaired UE functional use, improper body mechanics, postural dysfunction, and pain.   ACTIVITY LIMITATIONS: carrying, lifting, bed mobility, bathing, toileting, dressing, self feeding, reach over head, hygiene/grooming, and caring for others  PARTICIPATION LIMITATIONS: meal prep, cleaning, laundry, medication management, shopping, community activity, and yard work  PERSONAL FACTORS: Age, Fitness, Past/current experiences, Time since onset of injury/illness/exacerbation, and 3+ comorbidities: anemia, anxiety, breast cancer, CKD, claustrophobia, depression, migraines, OA, PONV, sleep apnea, stroke, lumbar fusion, mastectomy.   are also affecting patient's functional outcome.   REHAB POTENTIAL: Good  CLINICAL DECISION MAKING: Evolving/moderate complexity  EVALUATION COMPLEXITY: Moderate   GOALS: Goals reviewed with patient? Yes  SHORT TERM GOALS: Target date: 10/04/2023   Patient will be independent in home exercise program to improve strength/mobility for better functional independence with ADLs. Baseline: 10/23: give next session ;12/19: pt indep Goal status: MET   LONG TERM GOALS: Target date: 11/15/2023   Patient will increase FOTO score to equal to or greater than 49    to demonstrate statistically significant improvement in mobility and quality of life.  Baseline: 32; 08/23/2023: 31 Goal status: INITIAL  2.  Patient will report a worst pain of 3/10 on VAS in L shoulder  to improve tolerance with ADLs and reduced symptoms with activities.  Baseline: 7/10; 12/19: pt reports worst pain is a 6/10, felt in the evenings Goal status: ONGOING  3.  Patient will improve shoulder  AROM to > 140 degrees of flexion, scaption, and abduction for improved ability to perform overhead activities. Baseline:12/19: flexion 160 deg; abduction ~140 pain limited Goal status: PARTIALLY MET  4.  Patient will improve shoulder strength to 4/5 for improved functional ADL and iADL performance Baseline: 57/84: unable to perform today due  to pain and ROM limitations;12/19: grossly 4-/5  Goal status: ONGOING  5. Patient will increase six minute walk test distance to >1000 for progression to community ambulator and improve gait ability  Baseline: 652 ft with SPC; 12/19: 391 ft with SPC  Goal status: INITIAL  6. Patient will reduce modified Oswestry score to <50 as to demonstrate decreased disability with ADLs including improved sleeping tolerance, walking/sitting tolerance etc for better mobility with ADLs.  Baseline: 78% Goal status: NEW   7. Patient will increase BLE gross strength to 4+/5 as to improve functional strength for independent gait, increased standing tolerance and increased ADL ability. Baseline:Gross BLE strength is 4-/5, pain-limited RLE  Goal status: NEW   PLAN:  PT FREQUENCY: 2x/week  PT DURATION: 12 weeks  PLANNED INTERVENTIONS: 97164- PT Re-evaluation, 97110-Therapeutic exercises, 97530- Therapeutic activity, 97112- Neuromuscular re-education, 97535- Self Care, 16109- Manual therapy, [id number]- Gait training, 97014- Electrical stimulation (unattended), [phone]- Electrical stimulation (manual), [id number]- Traction (mechanical), Patient/Family education, Balance training, Taping, Joint mobilization, Spinal mobilization, Manual lymph drainage, Scar mobilization, Compression bandaging, Vestibular training, Visual/preceptual remediation/compensation, Cognitive remediation, Cryotherapy, Moist heat, and Biofeedback  PLAN FOR NEXT SESSION:  Progression of core stabilization and continuation of LE stretching.   4:20 PM, 10/23/23      Precious Bard, PT 10/23/2023, 4:20 PM

## 2023-10-23 ENCOUNTER — Telehealth: Payer: Self-pay

## 2023-10-23 ENCOUNTER — Ambulatory Visit: Payer: Medicaid Other

## 2023-10-23 DIAGNOSIS — R262 Difficulty in walking, not elsewhere classified: Secondary | ICD-10-CM

## 2023-10-23 DIAGNOSIS — M5459 Other low back pain: Secondary | ICD-10-CM

## 2023-10-23 DIAGNOSIS — G8929 Other chronic pain: Secondary | ICD-10-CM

## 2023-10-23 DIAGNOSIS — M6281 Muscle weakness (generalized): Secondary | ICD-10-CM | POA: Diagnosis not present

## 2023-10-23 DIAGNOSIS — R2681 Unsteadiness on feet: Secondary | ICD-10-CM

## 2023-10-23 NOTE — Telephone Encounter (Signed)
I spoke with patient to inform her that she no longer qualifies for the [facility], as she is no longer undergoing chemotherapy or radiation treatment. Additionally, I explained that the grants's eligibility requirements have recently changed. I provided this information to ensure she is aware of the updated criteria.

## 2023-10-25 NOTE — Therapy (Incomplete)
OUTPATIENT PHYSICAL THERAPY TREATMENT     Patient Name: Vanessa Medina MRN: [id number] DOB:[birth year], 53 y.o., female Today's Date: 10/25/2023  END OF SESSION:       Past Medical History:  Diagnosis Date   Acute nontraumatic kidney injury (HCC)    Anemia    Anxiety    Benign cyst of kidney    Breast cancer (HCC) 01/18/2023   left breast   Cervical cancer (HCC)    Chronic hypotension    Chronic kidney disease    Previous now clear   Claustrophobia    Decompression injury of spinal cord    Depression    Family history of adverse reaction to anesthesia     " my mother takes a long time time wake up"   History of dysplastic nevus 01/18/2018   right shoulder, recurrent dysplastic nevus   History of kidney stones    History of shingles    Migraine    Multiple allergies    Obesity    Osteoarthritis    left hip   Pneumonia    PONV (postoperative nausea and vomiting)    slow to wake up   Sleep apnea    does not wear CPAP   Slow to wake up after anesthesia    Stroke (HCC) 08/11/2020   spinal cord stroke, spinal cord puncture during surgery   Wears glasses    Past Surgical History:  Procedure Laterality Date   ABDOMINAL HYSTERECTOMY     APPENDECTOMY     BACK SURGERY     BILIOPANCREATIC DIVISION W DUODENAL SWITCH N/A    BREAST BIOPSY Left 12/06/2022   u/s bx,1:00 heart clip, path pending   BREAST BIOPSY Left 12/06/2022   [name] bx 2:00 ribbon clip path pending   BREAST BIOPSY Left 12/06/2022   [name] LT BREAST BX W LOC DEV EA ADD LESION IMG BX SPEC US GUIDE 12/06/2022 [facility]   BREAST BIOPSY Left 12/06/2022   [name] LT BREAST BX W LOC DEV 1ST LESION IMG BX SPEC US GUIDE 12/06/2022 [facility]   BREAST RECONSTRUCTION WITH PLACEMENT OF TISSUE EXPANDER AND FLEX HD (ACELLULAR HYDRATED DERMIS) Bilateral 01/18/2023   Procedure: IMMEDIATE LEFT BREAST RECONSTRUCTION WITH PLACEMENT OF TISSUE EXPANDER AND FLEX HD (ACELLULAR HYDRATED DERMIS);  Surgeon: Peggye Form, DO;  Location: [facility];  Service: Plastics;  Laterality: Bilateral;   BREAST RECONSTRUCTION WITH PLACEMENT OF TISSUE EXPANDER AND FLEX HD (ACELLULAR HYDRATED DERMIS) Left 03/26/2023   Procedure: BREAST RECONSTRUCTION WITH PLACEMENT OF TISSUE EXPANDER;  Surgeon: Peggye Form, DO;  Location: [facility];  Service: Plastics;  Laterality: Left;   CHOLECYSTECTOMY     COLONOSCOPY N/A 06/27/2021   Procedure: COLONOSCOPY;  Surgeon: Jaynie Collins, DO;  Location: [facility]COPY;  Service: Gastroenterology;  Laterality: N/A;   COLONOSCOPY N/A 08/27/2023   Procedure: COLONOSCOPY;  Surgeon: Jaynie Collins, DO;  Location: [facility] ENDOSCOPY;  Service: Gastroenterology;  Laterality: N/A;   DIAGNOSTIC LAPAROSCOPY     LOA   ESOPHAGOGASTRODUODENOSCOPY N/A 06/27/2021   Procedure: ESOPHAGOGASTRODUODENOSCOPY (EGD);  Surgeon: Jaynie Collins, DO;  Location: [facility]COPY;  Service: Gastroenterology;  Laterality: N/A;   ESOPHAGOGASTRODUODENOSCOPY (EGD) WITH PROPOFOL N/A 07/25/2017   Procedure: ESOPHAGOGASTRODUODENOSCOPY (EGD) WITH PROPOFOL;  Surgeon: Scot Jun, MD;  Location: [facility] ENDOSCOPY;  Service: Endoscopy;  Laterality: N/A;   FLEXIBLE BRONCHOSCOPY     HERNIA REPAIR     KNEE ARTHROSCOPY     LAPAROSCOPIC GASTRIC SLEEVE RESECTION WITH HIATAL HERNIA REPAIR  LUMBAR FUSION  08/29/2022   Revision Lumbar two through five Laminectomy & Fusion with Transforaminal Lumbar interbody fusion   MASTECTOMY W/ SENTINEL NODE BIOPSY Bilateral 01/18/2023   Procedure: MASTECTOMY WITH SENTINEL LYMPH NODE BIOPSY, RNFA to assist;  Surgeon: Leafy Ro, MD;  Location: [facility];  Service: General;  Laterality: Bilateral;   REMOVAL OF TISSUE EXPANDER AND PLACEMENT OF IMPLANT Bilateral 06/06/2023   Procedure: REMOVAL OF TISSUE EXPANDER AND PLACEMENT OF IMPLANT;  Surgeon: Peggye Form, DO;  Location: [facility];  Service: Plastics;  Laterality: Bilateral;    SPINAL CORD STIMULATOR IMPLANT     TONSILLECTOMY     TOTAL HIP ARTHROPLASTY Left 01/26/2016   Procedure: LEFT TOTAL HIP ARTHROPLASTY ANTERIOR APPROACH;  Surgeon: Tarry Kos, MD;  Location: [facility];  Service: Orthopedics;  Laterality: Left;   TRANSFORAMINAL LUMBAR INTERBODY FUSION (TLIF) WITH PEDICLE SCREW FIXATION 3 LEVEL  08/2019   Vira Browns, MD L2-L5   Patient Active Problem List   Diagnosis Date Noted   Pain and swelling of lower leg 09/25/2023   Lymphedema 09/25/2023   Long term (current) use of aromatase inhibitors 09/07/2023   At risk for loss of bone density 09/07/2023   Changing skin lesion 09/04/2023   Frequent falls 08/22/2023   Chronic pain syndrome 08/22/2023   Bilateral lower extremity edema 08/06/2023   Back pain 08/06/2023   Claustrophobia    Pernicious anemia 03/14/2023   Ruptured left breast implant 02/13/2023   S/P mastectomy, bilateral 01/18/2023   Genetic testing 12/18/2022   Breast cancer (HCC) 12/08/2022   Family history of breast cancer 12/08/2022   Profound fatigue 12/08/2022   Lumbar post-laminectomy syndrome 06/02/2022   Pseudarthrosis after fusion or arthrodesis 05/19/2022   Primary osteoarthritis of right knee 03/28/2022   Subjective tinnitus of both ears 12/21/2021   Abnormality of gait 04/29/2021   Abnormal sensation in both ears 04/06/2021   Other adverse food reactions, not elsewhere classified, subsequent encounter 04/06/2021   Multiple drug allergies 04/06/2021   Insomnia due to medical condition 02/25/2021   Myofascial muscle pain 02/25/2021   Nerve pain 10/18/2020   Incomplete paraplegia (HCC) 10/18/2020   Orthostatic hypotension 10/18/2020   Renal cyst 09/23/2020   Chronic migraine without aura without status migrainosus, not intractable    Hypokalemia    Adjustment reaction with anxiety and depression    Spinal cord injury, lumbar, without spinal bone injury, sequela (HCC) 08/18/2020   Paraparesis (HCC)    Post-operative pain     Hypotension    Slow transit constipation    AKI (acute kidney injury) (HCC)    Right leg weakness 08/11/2020   DDD (degenerative disc disease), lumbar 09/02/2019    Class: Chronic   Degenerative disc disease, lumbar 09/02/2019   Chest tightness 09/23/2018   Bradycardia 09/23/2018   BMI 40.0-44.9, adult (HCC) 04/17/2018   Status post bariatric surgery 04/17/2018   Labile blood pressure 03/08/2018   Chronic low back pain 09/03/2017   History of total hip replacement, left 01/26/2016   Osteoarthritis of spine with radiculopathy, lumbar region 10/16/2014   LEG PAIN, BILATERAL 12/16/2007   Malignant neoplasm of cervix uteri (HCC) 07/02/2007   MORBID OBESITY 07/02/2007   DEPRESSION 07/02/2007   Migraine without aura 07/02/2007   Other allergic rhinitis 07/02/2007   Asthma 07/02/2007   GERD 07/02/2007   ELEVATED BLOOD PRESSURE WITHOUT DIAGNOSIS OF HYPERTENSION 07/02/2007   Asthma 07/02/2007    PCP: [name] MD  REFERRING PROVIDER: Caroline More  REFERRING  DIAG: s/p breast reconstruction  THERAPY DIAG:  No diagnosis found.  Rationale for Evaluation and Treatment: Rehabilitation  ONSET DATE: December 07 2022  SUBJECTIVE:                                                                                                                                                                                      SUBJECTIVE STATEMENT: Patient waiting for call to schedule MRI, has had two falls since seen last.    Hand dominance: Right  PERTINENT HISTORY: 08/23/2023:  Pt with new orders to address back pain prior to possible surgery for [facility] stimulator. Pt with hx of multiple back surgeries with chronic pain and now worsening pain with decline in mobility. Worst pain reaches 10/10, best 4/10. Pt ideally would like to improve pain. She would also like to improve her ability to walk, reports has not been able to go for walks in a while. Was ambulating without an AD for a while but now must  use SPC again. She reports ability to stand, such as to do dishes, is limited due to pain. Pt experiences radiating pain down RLE into R foot and toes. Hx: L2-5 TLIF in 2020, L2-5 TLIF revision in December 2023.  Patient is having LUE ROM issues s/p breast cancer implant reconstruction. Patient underwent bilateral exchange of tissue expanders for implants, bilateral capsulotomies for implant repositioning and right breast capsulectomy with major repositioning of 3 x 8 cm of capsule on 06/06/2023. Patient had back surgery December 26th 2023 and then was diagnosed with breast cancer December 07, 2022. Had surgery Jan 18, 2023 for her breast cancer, had double mastectomy. Patient had two sets of expanders, first one burst, the second one kept flipping and moving. Had to have more work on L side. PMH includes: anemia, anxiety, breast cancer, CKD, claustrophobia, depression, migraines, OA, PONV, sleep apnea, stroke, lumbar fusion, mastectomy.   PAIN:  Are you having pain? 7/10 central lower thoracic to coccyx area.      PRECAUTIONS: Other:   , pt reports LATEX ALLERGY   RED FLAGS: -no saddle numbness, chronic known incontinence;   WEIGHT BEARING RESTRICTIONS: Pt unaware of any current lifting restrictions anymore  FALLS:  Has patient fallen in last 6 months? Yes. Number of falls 1  LIVING ENVIRONMENT: Lives with: lives with their family Lives in: House/apartment Stairs: Yes: External: 2 steps; on right going up and on left going up Has following equipment at home: Single point cane, Walker - 2 wheeled, shower chair, and Grab bars  OCCUPATION: Disability  --> retirement   PLOF: Independent with basic ADLs  PATIENT GOALS:  Reduce pain in her back, improve  ability to regain mobility, reduce falls   NEXT MD VISIT:   OBJECTIVE:  Note: Objective measures were completed at Evaluation unless otherwise noted.  DIAGNOSTIC FINDINGS:  N/a   PATIENT SURVEYS:  FOTO 32  COGNITION: Overall  cognitive status:  some short term memory      SENSATION: Paresthesias in BLE, RLE more distal than left    LE MMT: 08/23/23 Gross BLE strength is 4-/5, pain-limited RLE    TODAY'S TREATMENT:                                                                                                                                         DATE: 10/25/23   THEREX: Hamstring stretch 60 seconds each LE Single knee to chest 60 seconds each LE Figure four modified 60 seconds each LE Rotation 60 seconds x multiple trials SAQ 15x each LE; 2 sets  Therapeutic activities:  TrA bracing in hooklye - hold 5 sec x 15 reps Adduction ball squeeze with core activation 15x 3 seconds Single leg abduction to 45 degrees, return to neutral 10x Ambulate with SPC with focus on step length and pain reduction technique   -hooklying to sitting EOB supervision using correct log roll technique  -static stand with focus on breathing for pain reduction  PATIENT EDUCATION: Education details: goals, plan Person educated: Patient Education method: Explanation, Demonstration, Tactile cues, and Verbal cues Education comprehension: verbalized understanding, returned demonstration, verbal cues required, and tactile cues required  HOME EXERCISE PROGRAM:  PREV Access Code: [email or web address] URL: [email or web address] Date: 08/16/2023 Prepared by: Temple Pacini  Exercises - Standing Shoulder External Rotation with Resistance  - 1 x daily - 5-6 x weekly - 3 sets - 10 reps - Supine Alternating Shoulder Flexion  - 1 x daily - 5-6 x weekly - 3 sets - 10 reps - Seated Shoulder Row with Anchored Resistance  - 1 x daily - 5-6 x weekly - 3 sets - 10 reps   12/2: Access Code: [email or web address] URL: [email or web address] Date: 08/06/2023 Prepared by: Temple Pacini  Exercises - Seated Shoulder Flexion Towel Slide at Table Top  - 1 x daily - 5-7 x weekly - 2 sets - 10 reps - 15-30 sec hold - Seated Shoulder Abduction  Towel Slide at Table Top  - 1 x daily - 5-7 x weekly - 2 sets - 10 reps - 15-30 sec hold  11/26: updated Access Code: [email or web address] URL: [email or web address] Date: 07/31/2023 Prepared by: Temple Pacini  Exercises - Isometric Shoulder Flexion at Wall  - 1 x daily - 7 x weekly - 2 sets - 10 reps - Isometric Shoulder Extension at Wall  - 1 x daily - 7 x weekly - 2 sets - 10 reps - Standing Isometric Shoulder Internal Rotation at Doorway  - 1 x daily - 7 x weekly - 2 sets - 10 reps - Standing Isometric Shoulder External Rotation with Doorway  -  1 x daily - 7 x weekly - 2 sets - 10 reps  Access Code: [email or web address] URL: [email or web address] Date: 07/02/2023 Prepared by: Temple Pacini  Exercises - Seated Shoulder Shrugs  - 2 x daily - 6-7 x weekly - 3 sets - 10 reps - Seated Shoulder Shrug Circles AROM Backward  - 2 x daily - 6-7 x weekly - 3 sets - 10 reps - Seated Scapular Retraction  - 1 x daily - 6-7 x weekly - 3 sets - 10 reps  ASSESSMENT:  CLINICAL IMPRESSION: Patient's back pain significantly limits functional performance this session. She is highly motivated throughout session despite pain. She is eager to progress her pain free mobility, educated on need to call physicians office due to not hearing back about scheduling MRI yet. Patient will benefit from skilled physical therapy to reduce pain and improve mobility.   OBJECTIVE IMPAIRMENTS: Abnormal gait, decreased endurance, decreased mobility, decreased ROM, decreased strength, hypomobility, increased edema, increased fascial restrictions, impaired perceived functional ability, increased muscle spasms, impaired flexibility, impaired UE functional use, improper body mechanics, postural dysfunction, and pain.   ACTIVITY LIMITATIONS: carrying, lifting, bed mobility, bathing, toileting, dressing, self feeding, reach over head, hygiene/grooming, and caring for others  PARTICIPATION LIMITATIONS: meal prep, cleaning,  laundry, medication management, shopping, community activity, and yard work  PERSONAL FACTORS: Age, Fitness, Past/current experiences, Time since onset of injury/illness/exacerbation, and 3+ comorbidities: anemia, anxiety, breast cancer, CKD, claustrophobia, depression, migraines, OA, PONV, sleep apnea, stroke, lumbar fusion, mastectomy.   are also affecting patient's functional outcome.   REHAB POTENTIAL: Good  CLINICAL DECISION MAKING: Evolving/moderate complexity  EVALUATION COMPLEXITY: Moderate   GOALS: Goals reviewed with patient? Yes  SHORT TERM GOALS: Target date: 10/04/2023   Patient will be independent in home exercise program to improve strength/mobility for better functional independence with ADLs. Baseline: 10/23: give next session ;12/19: pt indep Goal status: MET   LONG TERM GOALS: Target date: 11/15/2023   Patient will increase FOTO score to equal to or greater than 49    to demonstrate statistically significant improvement in mobility and quality of life.  Baseline: 32; 08/23/2023: 31 Goal status: INITIAL  2.  Patient will report a worst pain of 3/10 on VAS in L shoulder  to improve tolerance with ADLs and reduced symptoms with activities.  Baseline: 7/10; 12/19: pt reports worst pain is a 6/10, felt in the evenings Goal status: ONGOING  3.  Patient will improve shoulder AROM to > 140 degrees of flexion, scaption, and abduction for improved ability to perform overhead activities. Baseline:12/19: flexion 160 deg; abduction ~140 pain limited Goal status: PARTIALLY MET  4.  Patient will improve shoulder strength to 4/5 for improved functional ADL and iADL performance Baseline: 96/29: unable to perform today due to pain and ROM limitations;12/19: grossly 4-/5  Goal status: ONGOING  5. Patient will increase six minute walk test distance to >1000 for progression to community ambulator and improve gait ability  Baseline: 652 ft with SPC; 12/19: 391 ft with SPC  Goal  status: INITIAL  6. Patient will reduce modified Oswestry score to <50 as to demonstrate decreased disability with ADLs including improved sleeping tolerance, walking/sitting tolerance etc for better mobility with ADLs.  Baseline: 78% Goal status: NEW   7. Patient will increase BLE gross strength to 4+/5 as to improve functional strength for independent gait, increased standing tolerance and increased ADL ability. Baseline:Gross BLE strength is 4-/5, pain-limited RLE  Goal status: NEW   PLAN:  PT FREQUENCY: 2x/week  PT DURATION: 12 weeks  PLANNED INTERVENTIONS: 97164- PT Re-evaluation, 97110-Therapeutic exercises, 97530- Therapeutic activity, [id number]- Neuromuscular re-education, 97535- Self Care, [id number]- Manual therapy, [id number]- Gait training, 97014- Electrical stimulation (unattended), [phone]- Electrical stimulation (manual), [phone]- Traction (mechanical), Patient/Family education, Balance training, Taping, Joint mobilization, Spinal mobilization, Manual lymph drainage, Scar mobilization, Compression bandaging, Vestibular training, Visual/preceptual remediation/compensation, Cognitive remediation, Cryotherapy, Moist heat, and Biofeedback  PLAN FOR NEXT SESSION:  Progression of core stabilization and continuation of LE stretching.   10:53 PM, 10/25/23      Lenda Kelp, PT 10/25/2023, 10:53 PM

## 2023-10-26 ENCOUNTER — Ambulatory Visit: Payer: Medicaid Other

## 2023-10-27 ENCOUNTER — Ambulatory Visit
Admission: RE | Admit: 2023-10-27 | Discharge: 2023-10-27 | Disposition: A | Discharge status: Home / Self Care | Payer: Medicaid Other | Source: Ambulatory Visit | Attending: Physician Assistant | Admitting: Physician Assistant

## 2023-10-27 DIAGNOSIS — Z981 Arthrodesis status: Secondary | ICD-10-CM | POA: Diagnosis present

## 2023-10-27 DIAGNOSIS — R9389 Abnormal findings on diagnostic imaging of other specified body structures: Secondary | ICD-10-CM | POA: Diagnosis present

## 2023-10-27 DIAGNOSIS — M4726 Other spondylosis with radiculopathy, lumbar region: Secondary | ICD-10-CM | POA: Insufficient documentation

## 2023-10-27 DIAGNOSIS — M546 Pain in thoracic spine: Secondary | ICD-10-CM | POA: Diagnosis present

## 2023-10-27 MED ORDER — GADOBUTROL 1 MMOL/ML IV SOLN
10.0000 mL | Freq: Once | INTRAVENOUS | Status: AC | PRN
  Administered 2023-10-27: 10 mL via INTRAVENOUS

## 2023-10-28 NOTE — Therapy (Incomplete)
 OUTPATIENT PHYSICAL THERAPY TREATMENT     Patient Name: Vanessa Medina MRN: [id number] DOB:November 22, 1970, 53 y.o., female Today's Date: 10/28/2023  END OF SESSION:       Past Medical History:  Diagnosis Date   Acute nontraumatic kidney injury (HCC)    Anemia    Anxiety    Benign cyst of kidney    Breast cancer (HCC) 01/18/2023   left breast   Cervical cancer (HCC)    Chronic hypotension    Chronic kidney disease    Previous now clear   Claustrophobia    Decompression injury of spinal cord    Depression    Family history of adverse reaction to anesthesia     " my mother takes a long time time wake up"   History of dysplastic nevus 01/18/2018   right shoulder, recurrent dysplastic nevus   History of kidney stones    History of shingles    Migraine    Multiple allergies    Obesity    Osteoarthritis    left hip   Pneumonia    PONV (postoperative nausea and vomiting)    slow to wake up   Sleep apnea    does not wear CPAP   Slow to wake up after anesthesia    Stroke (HCC) 08/11/2020   spinal cord stroke, spinal cord puncture during surgery   Wears glasses    Past Surgical History:  Procedure Laterality Date   ABDOMINAL HYSTERECTOMY     APPENDECTOMY     BACK SURGERY     BILIOPANCREATIC DIVISION W DUODENAL SWITCH N/A    BREAST BIOPSY Left 12/06/2022   u/s bx,1:00 heart clip, path pending   BREAST BIOPSY Left 12/06/2022   [name] bx 2:00 ribbon clip path pending   BREAST BIOPSY Left 12/06/2022   [name] LT BREAST BX W LOC DEV EA ADD LESION IMG BX SPEC US GUIDE 12/06/2022 [facility]   BREAST BIOPSY Left 12/06/2022   [name] LT BREAST BX W LOC DEV 1ST LESION IMG BX SPEC US GUIDE 12/06/2022 [facility]   BREAST RECONSTRUCTION WITH PLACEMENT OF TISSUE EXPANDER AND FLEX HD (ACELLULAR HYDRATED DERMIS) Bilateral 01/18/2023   Procedure: IMMEDIATE LEFT BREAST RECONSTRUCTION WITH PLACEMENT OF TISSUE EXPANDER AND FLEX HD (ACELLULAR HYDRATED DERMIS);  Surgeon: Peggye Form, DO;  Location: [facility];  Service: Plastics;  Laterality: Bilateral;   BREAST RECONSTRUCTION WITH PLACEMENT OF TISSUE EXPANDER AND FLEX HD (ACELLULAR HYDRATED DERMIS) Left 03/26/2023   Procedure: BREAST RECONSTRUCTION WITH PLACEMENT OF TISSUE EXPANDER;  Surgeon: Peggye Form, DO;  Location: [facility];  Service: Plastics;  Laterality: Left;   CHOLECYSTECTOMY     COLONOSCOPY N/A 06/27/2021   Procedure: COLONOSCOPY;  Surgeon: Jaynie Collins, DO;  Location: [facility];  Service: Gastroenterology;  Laterality: N/A;   COLONOSCOPY N/A 08/27/2023   Procedure: COLONOSCOPY;  Surgeon: Jaynie Collins, DO;  Location: [facility]COPY;  Service: Gastroenterology;  Laterality: N/A;   DIAGNOSTIC LAPAROSCOPY     LOA   ESOPHAGOGASTRODUODENOSCOPY N/A 06/27/2021   Procedure: ESOPHAGOGASTRODUODENOSCOPY (EGD);  Surgeon: Jaynie Collins, DO;  Location: [facility] ENDOSCOPY;  Service: Gastroenterology;  Laterality: N/A;   ESOPHAGOGASTRODUODENOSCOPY (EGD) WITH PROPOFOL N/A 07/25/2017   Procedure: ESOPHAGOGASTRODUODENOSCOPY (EGD) WITH PROPOFOL;  Surgeon: Scot Jun, MD;  Location: [facility]COPY;  Service: Endoscopy;  Laterality: N/A;   FLEXIBLE BRONCHOSCOPY     HERNIA REPAIR     KNEE ARTHROSCOPY     LAPAROSCOPIC GASTRIC SLEEVE RESECTION WITH HIATAL HERNIA REPAIR  LUMBAR FUSION  08/29/2022   Revision Lumbar two through five Laminectomy & Fusion with Transforaminal Lumbar interbody fusion   MASTECTOMY W/ SENTINEL NODE BIOPSY Bilateral 01/18/2023   Procedure: MASTECTOMY WITH SENTINEL LYMPH NODE BIOPSY, RNFA to assist;  Surgeon: Leafy Ro, MD;  Location: [facility];  Service: General;  Laterality: Bilateral;   REMOVAL OF TISSUE EXPANDER AND PLACEMENT OF IMPLANT Bilateral 06/06/2023   Procedure: REMOVAL OF TISSUE EXPANDER AND PLACEMENT OF IMPLANT;  Surgeon: Peggye Form, DO;  Location: [facility];  Service: Plastics;  Laterality: Bilateral;    SPINAL CORD STIMULATOR IMPLANT     TONSILLECTOMY     TOTAL HIP ARTHROPLASTY Left 01/26/2016   Procedure: LEFT TOTAL HIP ARTHROPLASTY ANTERIOR APPROACH;  Surgeon: Tarry Kos, MD;  Location: [facility];  Service: Orthopedics;  Laterality: Left;   TRANSFORAMINAL LUMBAR INTERBODY FUSION (TLIF) WITH PEDICLE SCREW FIXATION 3 LEVEL  08/2019   Vira Browns, MD L2-L5   Patient Active Problem List   Diagnosis Date Noted   Pain and swelling of lower leg 09/25/2023   Lymphedema 09/25/2023   Long term (current) use of aromatase inhibitors 09/07/2023   At risk for loss of bone density 09/07/2023   Changing skin lesion 09/04/2023   Frequent falls 08/22/2023   Chronic pain syndrome 08/22/2023   Bilateral lower extremity edema 08/06/2023   Back pain 08/06/2023   Claustrophobia    Pernicious anemia 03/14/2023   Ruptured left breast implant 02/13/2023   S/P mastectomy, bilateral 01/18/2023   Genetic testing 12/18/2022   Breast cancer (HCC) 12/08/2022   Family history of breast cancer 12/08/2022   Profound fatigue 12/08/2022   Lumbar post-laminectomy syndrome 06/02/2022   Pseudarthrosis after fusion or arthrodesis 05/19/2022   Primary osteoarthritis of right knee 03/28/2022   Subjective tinnitus of both ears 12/21/2021   Abnormality of gait 04/29/2021   Abnormal sensation in both ears 04/06/2021   Other adverse food reactions, not elsewhere classified, subsequent encounter 04/06/2021   Multiple drug allergies 04/06/2021   Insomnia due to medical condition 02/25/2021   Myofascial muscle pain 02/25/2021   Nerve pain 10/18/2020   Incomplete paraplegia (HCC) 10/18/2020   Orthostatic hypotension 10/18/2020   Renal cyst 09/23/2020   Chronic migraine without aura without status migrainosus, not intractable    Hypokalemia    Adjustment reaction with anxiety and depression    Spinal cord injury, lumbar, without spinal bone injury, sequela (HCC) 08/18/2020   Paraparesis (HCC)    Post-operative pain     Hypotension    Slow transit constipation    AKI (acute kidney injury) (HCC)    Right leg weakness 08/11/2020   DDD (degenerative disc disease), lumbar 09/02/2019    Class: Chronic   Degenerative disc disease, lumbar 09/02/2019   Chest tightness 09/23/2018   Bradycardia 09/23/2018   BMI 40.0-44.9, adult (HCC) 04/17/2018   Status post bariatric surgery 04/17/2018   Labile blood pressure 03/08/2018   Chronic low back pain 09/03/2017   History of total hip replacement, left 01/26/2016   Osteoarthritis of spine with radiculopathy, lumbar region 10/16/2014   LEG PAIN, BILATERAL 12/16/2007   Malignant neoplasm of cervix uteri (HCC) 07/02/2007   MORBID OBESITY 07/02/2007   DEPRESSION 07/02/2007   Migraine without aura 07/02/2007   Other allergic rhinitis 07/02/2007   Asthma 07/02/2007   GERD 07/02/2007   ELEVATED BLOOD PRESSURE WITHOUT DIAGNOSIS OF HYPERTENSION 07/02/2007   Asthma 07/02/2007    PCP: [name] MD  REFERRING PROVIDER: Caroline More  REFERRING  DIAG: s/p breast reconstruction  THERAPY DIAG:  No diagnosis found.  Rationale for Evaluation and Treatment: Rehabilitation  ONSET DATE: December 07 2022  SUBJECTIVE:                                                                                                                                                                                      SUBJECTIVE STATEMENT: *** Patient waiting for call to schedule MRI, has had two falls since seen last.    Hand dominance: Right  PERTINENT HISTORY: 08/23/2023:  Pt with new orders to address back pain prior to possible surgery for [facility] stimulator. Pt with hx of multiple back surgeries with chronic pain and now worsening pain with decline in mobility. Worst pain reaches 10/10, best 4/10. Pt ideally would like to improve pain. She would also like to improve her ability to walk, reports has not been able to go for walks in a while. Was ambulating without an AD for a while but now  must use SPC again. She reports ability to stand, such as to do dishes, is limited due to pain. Pt experiences radiating pain down RLE into R foot and toes. Hx: L2-5 TLIF in 2020, L2-5 TLIF revision in December 2023.  Patient is having LUE ROM issues s/p breast cancer implant reconstruction. Patient underwent bilateral exchange of tissue expanders for implants, bilateral capsulotomies for implant repositioning and right breast capsulectomy with major repositioning of 3 x 8 cm of capsule on 06/06/2023. Patient had back surgery December 26th 2023 and then was diagnosed with breast cancer December 07, 2022. Had surgery Jan 18, 2023 for her breast cancer, had double mastectomy. Patient had two sets of expanders, first one burst, the second one kept flipping and moving. Had to have more work on L side. PMH includes: anemia, anxiety, breast cancer, CKD, claustrophobia, depression, migraines, OA, PONV, sleep apnea, stroke, lumbar fusion, mastectomy.   PAIN:  Are you having pain? 7/10 central lower thoracic to coccyx area.      PRECAUTIONS: Other:   , pt reports LATEX ALLERGY   RED FLAGS: -no saddle numbness, chronic known incontinence;   WEIGHT BEARING RESTRICTIONS: Pt unaware of any current lifting restrictions anymore  FALLS:  Has patient fallen in last 6 months? Yes. Number of falls 1  LIVING ENVIRONMENT: Lives with: lives with their family Lives in: House/apartment Stairs: Yes: External: 2 steps; on right going up and on left going up Has following equipment at home: Single point cane, Walker - 2 wheeled, shower chair, and Grab bars  OCCUPATION: Disability  --> retirement   PLOF: Independent with basic ADLs  PATIENT GOALS:  Reduce pain in her back,  improve ability to regain mobility, reduce falls   NEXT MD VISIT:   OBJECTIVE:  Note: Objective measures were completed at Evaluation unless otherwise noted.  DIAGNOSTIC FINDINGS:  N/a   PATIENT SURVEYS:  FOTO 32  COGNITION: Overall  cognitive status:  some short term memory      SENSATION: Paresthesias in BLE, RLE more distal than left    LE MMT: 08/23/23 Gross BLE strength is 4-/5, pain-limited RLE    TODAY'S TREATMENT:                                                                                                                                         DATE: 10/28/23   THEREX: Hamstring stretch 60 seconds each LE Single knee to chest 60 seconds each LE Figure four modified 60 seconds each LE Rotation 60 seconds x multiple trials SAQ 15x each LE; 2 sets  Therapeutic activities:  TrA bracing in hooklye - hold 5 sec x 15 reps Adduction ball squeeze with core activation 15x 3 seconds Single leg abduction to 45 degrees, return to neutral 10x Ambulate with SPC with focus on step length and pain reduction technique   -hooklying to sitting EOB supervision using correct log roll technique  -static stand with focus on breathing for pain reduction  PATIENT EDUCATION: Education details: goals, plan Person educated: Patient Education method: Explanation, Demonstration, Tactile cues, and Verbal cues Education comprehension: verbalized understanding, returned demonstration, verbal cues required, and tactile cues required  HOME EXERCISE PROGRAM:  PREV Access Code: [email or web address] URL: [email or web address] Date: 08/16/2023 Prepared by: Temple Pacini  Exercises - Standing Shoulder External Rotation with Resistance  - 1 x daily - 5-6 x weekly - 3 sets - 10 reps - Supine Alternating Shoulder Flexion  - 1 x daily - 5-6 x weekly - 3 sets - 10 reps - Seated Shoulder Row with Anchored Resistance  - 1 x daily - 5-6 x weekly - 3 sets - 10 reps   12/2: Access Code: [email or web address] URL: [email or web address] Date: 08/06/2023 Prepared by: Temple Pacini  Exercises - Seated Shoulder Flexion Towel Slide at Table Top  - 1 x daily - 5-7 x weekly - 2 sets - 10 reps - 15-30 sec hold - Seated Shoulder Abduction  Towel Slide at Table Top  - 1 x daily - 5-7 x weekly - 2 sets - 10 reps - 15-30 sec hold  11/26: updated Access Code: [email or web address] URL: [email or web address] Date: 07/31/2023 Prepared by: Temple Pacini  Exercises - Isometric Shoulder Flexion at Wall  - 1 x daily - 7 x weekly - 2 sets - 10 reps - Isometric Shoulder Extension at Wall  - 1 x daily - 7 x weekly - 2 sets - 10 reps - Standing Isometric Shoulder Internal Rotation at Doorway  - 1 x daily - 7 x weekly - 2 sets - 10 reps - Standing Isometric Shoulder External Rotation with Doorway  -  1 x daily - 7 x weekly - 2 sets - 10 reps  Access Code: [email or web address] URL: [email or web address] Date: 07/02/2023 Prepared by: Temple Pacini  Exercises - Seated Shoulder Shrugs  - 2 x daily - 6-7 x weekly - 3 sets - 10 reps - Seated Shoulder Shrug Circles AROM Backward  - 2 x daily - 6-7 x weekly - 3 sets - 10 reps - Seated Scapular Retraction  - 1 x daily - 6-7 x weekly - 3 sets - 10 reps  ASSESSMENT:  CLINICAL IMPRESSION: *** Patient's back pain significantly limits functional performance this session. She is highly motivated throughout session despite pain. She is eager to progress her pain free mobility, educated on need to call physicians office due to not hearing back about scheduling MRI yet. Patient will benefit from skilled physical therapy to reduce pain and improve mobility.   OBJECTIVE IMPAIRMENTS: Abnormal gait, decreased endurance, decreased mobility, decreased ROM, decreased strength, hypomobility, increased edema, increased fascial restrictions, impaired perceived functional ability, increased muscle spasms, impaired flexibility, impaired UE functional use, improper body mechanics, postural dysfunction, and pain.   ACTIVITY LIMITATIONS: carrying, lifting, bed mobility, bathing, toileting, dressing, self feeding, reach over head, hygiene/grooming, and caring for others  PARTICIPATION LIMITATIONS: meal prep, cleaning,  laundry, medication management, shopping, community activity, and yard work  PERSONAL FACTORS: Age, Fitness, Past/current experiences, Time since onset of injury/illness/exacerbation, and 3+ comorbidities: anemia, anxiety, breast cancer, CKD, claustrophobia, depression, migraines, OA, PONV, sleep apnea, stroke, lumbar fusion, mastectomy.   are also affecting patient's functional outcome.   REHAB POTENTIAL: Good  CLINICAL DECISION MAKING: Evolving/moderate complexity  EVALUATION COMPLEXITY: Moderate   GOALS: Goals reviewed with patient? Yes  SHORT TERM GOALS: Target date: 10/04/2023   Patient will be independent in home exercise program to improve strength/mobility for better functional independence with ADLs. Baseline: 10/23: give next session ;12/19: pt indep Goal status: MET   LONG TERM GOALS: Target date: 11/15/2023   Patient will increase FOTO score to equal to or greater than 49    to demonstrate statistically significant improvement in mobility and quality of life.  Baseline: 32; 08/23/2023: 31 Goal status: INITIAL  2.  Patient will report a worst pain of 3/10 on VAS in L shoulder  to improve tolerance with ADLs and reduced symptoms with activities.  Baseline: 7/10; 12/19: pt reports worst pain is a 6/10, felt in the evenings Goal status: ONGOING  3.  Patient will improve shoulder AROM to > 140 degrees of flexion, scaption, and abduction for improved ability to perform overhead activities. Baseline:12/19: flexion 160 deg; abduction ~140 pain limited Goal status: PARTIALLY MET  4.  Patient will improve shoulder strength to 4/5 for improved functional ADL and iADL performance Baseline: 25/36: unable to perform today due to pain and ROM limitations;12/19: grossly 4-/5  Goal status: ONGOING  5. Patient will increase six minute walk test distance to >1000 for progression to community ambulator and improve gait ability  Baseline: 652 ft with SPC; 12/19: 391 ft with SPC  Goal  status: INITIAL  6. Patient will reduce modified Oswestry score to <50 as to demonstrate decreased disability with ADLs including improved sleeping tolerance, walking/sitting tolerance etc for better mobility with ADLs.  Baseline: 78% Goal status: NEW   7. Patient will increase BLE gross strength to 4+/5 as to improve functional strength for independent gait, increased standing tolerance and increased ADL ability. Baseline:Gross BLE strength is 4-/5, pain-limited RLE  Goal status: NEW   PLAN:  PT FREQUENCY: 2x/week  PT DURATION: 12 weeks  PLANNED INTERVENTIONS: 97164- PT Re-evaluation, 97110-Therapeutic exercises, 97530- Therapeutic activity, [id number]- Neuromuscular re-education, 97535- Self Care, [id number]- Manual therapy, [id number]- Gait training, 97014- Electrical stimulation (unattended), [phone]- Electrical stimulation (manual), [phone]- Traction (mechanical), Patient/Family education, Balance training, Taping, Joint mobilization, Spinal mobilization, Manual lymph drainage, Scar mobilization, Compression bandaging, Vestibular training, Visual/preceptual remediation/compensation, Cognitive remediation, Cryotherapy, Moist heat, and Biofeedback  PLAN FOR NEXT SESSION:  Progression of core stabilization and continuation of LE stretching.   6:39 PM, 10/28/23      Lenda Kelp, PT 10/28/2023, 6:39 PM

## 2023-10-29 ENCOUNTER — Ambulatory Visit: Payer: Medicaid Other

## 2023-10-29 DIAGNOSIS — L905 Scar conditions and fibrosis of skin: Secondary | ICD-10-CM

## 2023-10-29 DIAGNOSIS — M25612 Stiffness of left shoulder, not elsewhere classified: Secondary | ICD-10-CM

## 2023-10-29 DIAGNOSIS — G8929 Other chronic pain: Secondary | ICD-10-CM

## 2023-10-29 DIAGNOSIS — R2681 Unsteadiness on feet: Secondary | ICD-10-CM

## 2023-10-29 DIAGNOSIS — R262 Difficulty in walking, not elsewhere classified: Secondary | ICD-10-CM

## 2023-10-29 DIAGNOSIS — M25512 Pain in left shoulder: Secondary | ICD-10-CM

## 2023-10-29 DIAGNOSIS — M6281 Muscle weakness (generalized): Secondary | ICD-10-CM

## 2023-10-29 DIAGNOSIS — R2689 Other abnormalities of gait and mobility: Secondary | ICD-10-CM

## 2023-10-29 DIAGNOSIS — M5459 Other low back pain: Secondary | ICD-10-CM

## 2023-10-29 DIAGNOSIS — I972 Postmastectomy lymphedema syndrome: Secondary | ICD-10-CM

## 2023-10-29 DIAGNOSIS — R278 Other lack of coordination: Secondary | ICD-10-CM

## 2023-10-29 NOTE — Therapy (Signed)
 Patient Name: Vanessa Medina MRN: [id number] DOB:[birth year], 53 y.o., female Today's Date: 10/29/2023  Patient arrived today  stating feeling the same with no improvement. She reported that she had 2 MRI's over the weekend and awaiting results. Discussed holding PT at this time due to limited visits and wanting to wait until have MRI results to assist in guiding plan of care. Patient in agreement with plan and okay with non-billable visit today. Patient instructed to call Clinic if MD wants her to continue PT based on pending MRI results.    Lenda Kelp, PT 10/29/2023, 10:05 AM

## 2023-11-08 ENCOUNTER — Telehealth: Payer: Self-pay | Admitting: Urology

## 2023-11-08 ENCOUNTER — Other Ambulatory Visit: Payer: Self-pay

## 2023-11-08 DIAGNOSIS — N3946 Mixed incontinence: Secondary | ICD-10-CM

## 2023-11-08 NOTE — Telephone Encounter (Signed)
 Patient called today regarding her appt scheduled for 11/12/23 with Dr. Sherron Monday for cysto. She was supposed to have UDS done prior at [facility], but was not able to have that done because she has [facility], and [facility] does not take her insurance. She is asking if she needs to still keep appt here on Monday. Also, if there is somewhere else she can have UDS done. Please advise.

## 2023-11-12 ENCOUNTER — Other Ambulatory Visit: Payer: Self-pay | Admitting: Urology

## 2023-11-14 NOTE — Telephone Encounter (Signed)
 She confirmed appt with Dr.Smith on 11/21/23.

## 2023-11-18 ENCOUNTER — Other Ambulatory Visit: Payer: Self-pay | Admitting: Physician Assistant

## 2023-11-19 ENCOUNTER — Telehealth (INDEPENDENT_AMBULATORY_CARE_PROVIDER_SITE_OTHER): Payer: Self-pay | Admitting: Plastic Surgery

## 2023-11-19 NOTE — Telephone Encounter (Signed)
 I called the patient.  She states that last week, she was having some left breast pain near her implant.  She states that this morning, she has felt a coldness to her left breast and feels that there is fluid to her left breast as well.  She feels that there is a little bit more swelling.  She denies any overlying erythema, significant pain, fevers or chills.  She is concerned that her left breast implant has ruptured.  She denies any specific traumas or strenuous activities.  Recommended that patient follow-up in the clinic tomorrow for evaluation.  Patient was in agreement with this.

## 2023-11-19 NOTE — Telephone Encounter (Signed)
 L Brst seems like the implant may have ruptured, feels like a cold wetness inside and has an indent, swolled up and implat moves as well  she is currently scheduled on Thursday but would like to come today if possible, please advise

## 2023-11-20 ENCOUNTER — Ambulatory Visit (INDEPENDENT_AMBULATORY_CARE_PROVIDER_SITE_OTHER): Admitting: Student

## 2023-11-20 ENCOUNTER — Encounter: Payer: Self-pay | Admitting: Student

## 2023-11-20 VITALS — BP 149/77 | HR 65 | Ht 64.0 in | Wt 271.2 lb

## 2023-11-20 DIAGNOSIS — Z9889 Other specified postprocedural states: Secondary | ICD-10-CM

## 2023-11-20 DIAGNOSIS — N6459 Other signs and symptoms in breast: Secondary | ICD-10-CM | POA: Diagnosis not present

## 2023-11-20 NOTE — Progress Notes (Signed)
 Referring Provider [name], MD [address] [facility] [place],  [place] [address]   CC:  Chief Complaint  Patient presents with   Follow-up      Vanessa Medina is an 53 y.o. female.  HPI: Patient is a 53 year old female who underwent bilateral breast reconstruction.  Patient underwent mastectomies and initial expander placement with Dr. Ulice Bold and Dr. Everlene Farrier on 01/18/2023.  She later underwent removal of a ruptured left expander and placement of a new expander with Dr. Ulice Bold on 03/26/2023.  Patient then eventually underwent exchange of breast expanders for breast implants with Dr. Ulice Bold on 06/06/2023.  She had [place] smooth round ultrahigh profile 650 cc gel implants placed bilaterally.  Patient presents to the clinic today because she feels that her left breast implant has ruptured.  Today, patient reports she is doing well.  She states that she has noticed some discomfort to her left breast as well as a "cool feeling" to the left breast as well.  She denies any drainage.  She denies any fevers or chills.  She does reports she has noticed the left implant has dropped and notices a volume deficit to the left chest wall.  She denies any issues with the right side.  Review of Systems General: Denies any fevers or chills  Physical Exam    11/20/2023    8:37 AM 10/17/2023    9:57 AM 09/27/2023   10:06 AM  Vitals with BMI  Height 5\' 4"  5\' 4"  5\' 4"   Weight 271 lbs 3 oz 258 lbs 258 lbs 13 oz  BMI 46.53 44.26 44.4  Systolic 149 134 191  Diastolic 77 82 80  Pulse 65  71    General:  No acute distress,  Alert and oriented, Non-Toxic, Normal speech and affect Chaperone present on exam.  On exam, patient is sitting upright in no acute distress.  Right breast implant is in place and is soft.  There is some volume deficit noted to the superior aspect of the chest wall.  There is no overlying erythema.  No obvious fluid collections palpated on exam.  Incision appears  to be well-healed.  To the left breast, implant is in place.  It appears to be intact upon palpation.  There is no overlying erythema.  There is some volume deficit to the superior chest wall, similar to the right side.  There is no obvious fluid wave appreciated on exam.  Left breast incision appears to be intact.  There are no signs of infection on exam.   Assessment/Plan  S/P breast reconstruction   Dr. Ulice Bold also had the opportunity to evaluate the patient.  There is no obvious fluid collection noted on exam and implant appear to be intact.  Recommended that patient massage left breast.  We also did offer an ultrasound, but patient declined at this time.  We also did offer physical therapy, but patient states that she has done physical therapy in the past and knows what exercises she needs to do and plans on doing them at home.  I recommended that the patient closely monitor her left breast.  Discussed with her that if pain does not improve or if she has any questions or concerns about anything to let us know.  Discussed with her that if pain does not improve, we can consider getting an ultrasound at that time.  Patient expressed understanding.  It was also recommended that patient follow-up with Dr. Ulice Bold in about 3 months to  discuss possible fat grafting to areas of volume deficit.  Patient expressed understanding.  Instructed patient to call if she has any questions or concerns about anything.  Pictures were obtained of the patient and placed in the chart with the patient's or guardian's permission.   Laurena Spies 11/20/2023, 9:44 AM

## 2023-11-21 ENCOUNTER — Ambulatory Visit: Admitting: Neurosurgery

## 2023-11-22 ENCOUNTER — Ambulatory Visit: Admitting: Surgical

## 2023-11-23 ENCOUNTER — Encounter: Payer: Medicaid Other | Admitting: Physical Medicine and Rehabilitation

## 2023-11-23 NOTE — Progress Notes (Unsigned)
 Referring Physician:  [name], MD [address] [place],  [place] [address]  Primary Physician:  [name], MD  History of Present Illness: 11/26/2023 [name] is here today with a chief complaint of back pain and bilateral lower extremity pain.  She has a history of chronic back pain has had multiple lumbar spine surgeries.  She underwent a trial for spinal cord stimulator in 2021 and felt that she had great relief from this.  Unfortunately during the permanent placement she developed extreme pain and was unable to keep the stimulator at that time.  Notably there was a percutaneous placement.  She is here today for further evaluation.  She is been seen by our team and was considered for a possible open spinal cord stimulator.  She has had the psychology evaluation and has had physical therapy.  At this time she would like to go forward with a spinal cord stimulator trial.  We discussed an open paddle trial as she has had a previous complication with a percutaneous trial.  Review of Systems:  A 10 point review of systems is negative, except for the pertinent positives and negatives detailed in the HPI.  Past Medical History: Past Medical History:  Diagnosis Date   Acute nontraumatic kidney injury (HCC)    Anemia    Anxiety    Benign cyst of kidney    Breast cancer (HCC) 01/18/2023   left breast   Cervical cancer (HCC)    Chronic hypotension    Chronic kidney disease    Previous now clear   Claustrophobia    Decompression injury of spinal cord    Depression    Family history of adverse reaction to anesthesia     " my mother takes a long time time wake up"   History of dysplastic nevus 01/18/2018   right shoulder, recurrent dysplastic nevus   History of kidney stones    History of shingles    Migraine    Multiple allergies    Obesity    Osteoarthritis    left hip   Pneumonia    PONV (postoperative nausea and vomiting)    slow to  wake up   Sleep apnea    does not wear CPAP   Slow to wake up after anesthesia    Stroke (HCC) 08/11/2020   spinal cord stroke, spinal cord puncture during surgery   Wears glasses     Past Surgical History: Past Surgical History:  Procedure Laterality Date   ABDOMINAL HYSTERECTOMY     APPENDECTOMY     BACK SURGERY     BILIOPANCREATIC DIVISION W DUODENAL SWITCH N/A    BREAST BIOPSY Left 12/06/2022   u/s bx,1:00 heart clip, path pending   BREAST BIOPSY Left 12/06/2022   [name] bx 2:00 ribbon clip path pending   BREAST BIOPSY Left 12/06/2022   [name] LT BREAST BX W LOC DEV EA ADD LESION IMG BX SPEC US GUIDE 12/06/2022 [facility]   BREAST BIOPSY Left 12/06/2022   [name] LT BREAST BX W LOC DEV 1ST LESION IMG BX SPEC US GUIDE 12/06/2022 [facility]   BREAST RECONSTRUCTION WITH PLACEMENT OF TISSUE EXPANDER AND FLEX HD (ACELLULAR HYDRATED DERMIS) Bilateral 01/18/2023   Procedure: IMMEDIATE LEFT BREAST RECONSTRUCTION WITH PLACEMENT OF TISSUE EXPANDER AND FLEX HD (ACELLULAR HYDRATED DERMIS);  Surgeon: Peggye Form, DO;  Location: [facility];  Service: Plastics;  Laterality: Bilateral;   BREAST RECONSTRUCTION WITH PLACEMENT OF TISSUE EXPANDER AND FLEX HD (ACELLULAR  HYDRATED DERMIS) Left 03/26/2023   Procedure: BREAST RECONSTRUCTION WITH PLACEMENT OF TISSUE EXPANDER;  Surgeon: Peggye Form, DO;  Location: [facility];  Service: Plastics;  Laterality: Left;   CHOLECYSTECTOMY     COLONOSCOPY N/A 06/27/2021   Procedure: COLONOSCOPY;  Surgeon: Jaynie Collins, DO;  Location: [facility];  Service: Gastroenterology;  Laterality: N/A;   COLONOSCOPY N/A 08/27/2023   Procedure: COLONOSCOPY;  Surgeon: Jaynie Collins, DO;  Location: [facility] ENDOSCOPY;  Service: Gastroenterology;  Laterality: N/A;   DIAGNOSTIC LAPAROSCOPY     LOA   ESOPHAGOGASTRODUODENOSCOPY N/A 06/27/2021   Procedure: ESOPHAGOGASTRODUODENOSCOPY (EGD);  Surgeon: Jaynie Collins, DO;  Location:  [facility] ENDOSCOPY;  Service: Gastroenterology;  Laterality: N/A;   ESOPHAGOGASTRODUODENOSCOPY (EGD) WITH PROPOFOL N/A 07/25/2017   Procedure: ESOPHAGOGASTRODUODENOSCOPY (EGD) WITH PROPOFOL;  Surgeon: Scot Jun, MD;  Location: [facility];  Service: Endoscopy;  Laterality: N/A;   FLEXIBLE BRONCHOSCOPY     HERNIA REPAIR     KNEE ARTHROSCOPY     LAPAROSCOPIC GASTRIC SLEEVE RESECTION WITH HIATAL HERNIA REPAIR     LUMBAR FUSION  08/29/2022   Revision Lumbar two through five Laminectomy & Fusion with Transforaminal Lumbar interbody fusion   MASTECTOMY W/ SENTINEL NODE BIOPSY Bilateral 01/18/2023   Procedure: MASTECTOMY WITH SENTINEL LYMPH NODE BIOPSY, RNFA to assist;  Surgeon: Leafy Ro, MD;  Location: [facility];  Service: General;  Laterality: Bilateral;   REMOVAL OF TISSUE EXPANDER AND PLACEMENT OF IMPLANT Bilateral 06/06/2023   Procedure: REMOVAL OF TISSUE EXPANDER AND PLACEMENT OF IMPLANT;  Surgeon: Peggye Form, DO;  Location: [facility];  Service: Plastics;  Laterality: Bilateral;   SPINAL CORD STIMULATOR IMPLANT     TONSILLECTOMY     TOTAL HIP ARTHROPLASTY Left 01/26/2016   Procedure: LEFT TOTAL HIP ARTHROPLASTY ANTERIOR APPROACH;  Surgeon: Tarry Kos, MD;  Location: [facility];  Service: Orthopedics;  Laterality: Left;   TRANSFORAMINAL LUMBAR INTERBODY FUSION (TLIF) WITH PEDICLE SCREW FIXATION 3 LEVEL  08/2019   Vira Browns, MD L2-L5    Allergies: Allergies as of 11/26/2023 - Review Complete 11/26/2023  Allergen Reaction Noted   Bee venom Swelling 08/15/2022   Iodinated contrast media Anaphylaxis 01/19/2016   Latex Hives and Rash 10/12/2022   Morphine Other (See Comments) 11/10/2022   Shellfish allergy Anaphylaxis 01/25/2016   Codeine Nausea And Vomiting 11/10/2022   Meperidine hcl Other (See Comments)    Bee pollen Other (See Comments) 01/25/2016   Oxycodone Hives and Itching 08/12/2020   Tape Hives 03/15/2023   Pentazocine lactate Nausea And  Vomiting    Propoxyphene Nausea Only and Nausea And Vomiting 01/07/2018    Medications:  Current Outpatient Medications:    ALPRAZolam (XANAX) 0.25 MG tablet, Take 1 tablet by mouth daily as needed., Disp: , Rfl:    azelastine (ASTELIN) 0.1 % nasal spray, Place 1 spray into both nostrils 2 (two) times daily. Use in each nostril as directed, Disp: , Rfl:    baclofen (LIORESAL) 10 MG tablet, Take 1 tablet (10 mg total) by mouth 3 (three) times daily. - for spasms, Disp: 270 tablet, Rfl: 3   colestipol (COLESTID) 1 g tablet, Take by mouth., Disp: , Rfl:    diclofenac Sodium (VOLTAREN) 1 % GEL, Apply 2 g topically 4 (four) times daily., Disp: 50 g, Rfl: 5   DULoxetine (CYMBALTA) 60 MG capsule, Take 2 capsules (120 mg total) by mouth at bedtime., Disp: 180 capsule, Rfl: 3   EPINEPHrine 0.3 mg/0.3 mL IJ SOAJ  injection, , Disp: , Rfl:    fluticasone (FLONASE) 50 MCG/ACT nasal spray, Place 2 sprays into both nostrils daily., Disp: , Rfl:    furosemide (LASIX) 20 MG tablet, Take 20 mg by mouth daily as needed., Disp: , Rfl:    gabapentin (NEURONTIN) 300 MG capsule, Take 1 capsule (300 mg total) by mouth at bedtime. For nerve pain, Disp: 90 capsule, Rfl: 3   letrozole (FEMARA) 2.5 MG tablet, Take 1 tablet (2.5 mg total) by mouth daily., Disp: 90 tablet, Rfl: 2   meloxicam (MOBIC) 7.5 MG tablet, TAKE 2 TABLETS BY MOUTH EVERY DAY, Disp: 60 tablet, Rfl: 0   methocarbamol (ROBAXIN) 500 MG tablet, Take 500 mg by mouth 4 (four) times daily., Disp: , Rfl:    montelukast (SINGULAIR) 10 MG tablet, TAKE 1 TABLET BY MOUTH AT  BEDTIME FOR ALLERGIES, Disp: 30 tablet, Rfl: 1   Multiple Vitamin (MULTIVITAMIN WITH MINERALS) TABS tablet, Take 1 tablet by mouth 2 (two) times daily., Disp: , Rfl:    pramipexole (MIRAPEX) 0.125 MG tablet, Take by mouth., Disp: , Rfl:    pyridOXINE (VITAMIN B-6) 100 MG tablet, Take 1 tablet (100 mg total) by mouth daily., Disp: 90 tablet, Rfl: 1   thiamine 250 MG tablet, Take 250 mg by  mouth daily., Disp: , Rfl:    tolterodine (DETROL LA) 4 MG 24 hr capsule, Take 1 capsule (4 mg total) by mouth daily., Disp: 90 capsule, Rfl: 0   topiramate (TOPAMAX) 100 MG tablet, TAKE 1 TABLET BY MOUTH AT  BEDTIME, Disp: 90 tablet, Rfl: 3   traMADol (ULTRAM) 50 MG tablet, Take 1 tablet (50 mg total) by mouth every 12 (twelve) hours as needed., Disp: 30 tablet, Rfl: 1   vitamin B-12 (CYANOCOBALAMIN) 1000 MCG tablet, Take 1 tablet (1,000 mcg total) by mouth daily., Disp: 90 tablet, Rfl: 1  Social History: Social History   Tobacco Use   Smoking status: Never    Passive exposure: Never   Smokeless tobacco: Never  Vaping Use   Vaping status: Never Used  Substance Use Topics   Alcohol use: Not Currently    Comment: occasional wine   Drug use: No    Family Medical History: Family History  Problem Relation Age of Onset   Renal Disease Mother    Hypertension Mother    Sudden Cardiac Death Mother    Heart failure Mother    Valvular heart disease Mother    Heart disease Mother    Breast cancer Maternal Grandmother    Stroke Brother    Heart attack Brother 46   Diabetes Other     Physical Examination: Vitals:   11/26/23 1405  BP: 134/78    General: Patient is in no apparent distress. Attention to examination is appropriate.  Neck:   Supple.  Full range of motion.  Respiratory: Patient is breathing without any difficulty.   NEUROLOGICAL:   Exam:     Awake, alert, oriented to person, place, and time.  Speech is clear and fluent. Fund of knowledge is appropriate.    Cranial Nerves: Pupils equal round and reactive to light.  Facial tone is symmetric.   ROM of spine: Decreased range of motion of lumbar spine.  Patient is tender to palpation of her thoracic and lumbar spine.   Strength:    Side Iliopsoas Quads Hamstring PF DF EHL R 4 5 4  4-4+ 4-4+ 4-4+ L 3 5 4 5 5 5      Difficult examination of bilateral lower extremities  secondary to patient's pain.  Patient also  has a tremor in bilateral extremities.   Difficulty obtaining reflexes of her lower extremity. 2+ Patellar reflex on the left, 1+ on the right. 1+achilles reflex on the right, absent on the left.  Unable to obtain hamstring reflex. Clonus is not present.  Toes are down-going.  Patient does have some decreased sensation to bilateral lower extremities Gait is altered and patient ambulates with a cane   Imaging:   MRI Thoracic spine (09/21/23): IMPRESSION: 1. No evidence of high-grade spinal canal or neural foraminal stenosis. 2. Narrowing of the dorsal epidural space at the T8-T11 levels secondary to ligamentum flavum hypertrophy. 3. Minimal T2 hyperintense signal abnormality and contrast enhancement in the left lamina at T6. While this finding is most likely degenerative in nature, a follow-up thoracic spine MRI in 1-2 months is recommended to assess for interval change and exclude the possibility of malignancy     I reviewed her MRI myself, she does have some slight narrowing in her dorsal thoracic levels, however I do feel she could move forward with a thoracic paddle trial, preferably a laminar access around the level of T9   Medical Decision Making/Assessment and Plan: Ms. Matarazzo is a pleasant 53 y.o. female with history of chronic back pain status post previous spinal fusions with failed back syndrome, chronic pain syndrome, who is here for evaluation for spinal cord stimulator.  She previously had a positive spinal cord stimulator trial with percutaneous leads, unfortunately had a complication during the percutaneous placement and this had to be removed.  Unfortunately she has not been able to have a stimulator in ever since.  At this point she came back for further evaluation.  We discussed possible open paddle trial, she has had physical therapy recently.  Though she has had a previous psychology evaluation, would like to update this prior to this procedure as she did have a previous  complication.  Once we have this we can move forward with planning a paddle trial.  When planned a paddle trial with a T9 access point given the anatomy noted on her MRI.  Should this give her adequate result we consider placing a permanent IPG for spinal cord stimulation.  We discussed risk and benefits of surgery, she did have a previous complication, we did discuss that we would be doing a laminectomy over top of her thoracic spine and this would be at risk for possible spinal cord injury albeit rare.  We also discussed other common risk factors including lead migration, need for further surgery, failure to improve her symptoms.  We did discuss that if she did not get greater than 50% improvement in her symptoms overall that we would have to remove the system and would not go forward with permanent placement.  She would like to go forward with this plan   Lovenia Kim MD/MSCR Neurosurgery  Spent a total of 30 minutes discussing her care, reviewing her outside records, face-to-face evaluation, counseling going forward with the procedure versus alternative treatmentt, and coordination of her care going forward.

## 2023-11-26 ENCOUNTER — Ambulatory Visit (INDEPENDENT_AMBULATORY_CARE_PROVIDER_SITE_OTHER): Admitting: Neurosurgery

## 2023-11-26 ENCOUNTER — Encounter: Payer: Self-pay | Admitting: Neurosurgery

## 2023-11-26 VITALS — BP 134/78 | Ht 64.0 in | Wt 271.0 lb

## 2023-11-26 DIAGNOSIS — M961 Postlaminectomy syndrome, not elsewhere classified: Secondary | ICD-10-CM

## 2023-11-26 DIAGNOSIS — G894 Chronic pain syndrome: Secondary | ICD-10-CM

## 2023-11-26 NOTE — Patient Instructions (Addendum)
 Please see below for information in regards to your upcoming surgery:  Contact our office after you complete the pysch eval   Planned surgery: 1) thoracic laminectomy for spinal cord stimulator paddle trial  2) internal pulse generator placement Conservation officer, historic buildings)   Surgery date: (to be determined after psych eval) at [facility]: [address] [place], [place] [address]) - you will find out your arrival time the business day before your surgery.   Pre-op appointment at [facility] Pre-admit Testing: we will call you with a date/time for this. If you are scheduled for an in person appointment, Pre-admit Testing is located on the first floor of the [facility] building, [address] [facility], [address].  During this appointment, they will advise you which medications you can take the morning of surgery, and which medications you will need to hold for surgery. Labs (such as blood work, EKG) may be done at your pre-op appointment. You are not required to fast for these labs. Should you need to change your pre-op appointment, please call Pre-admit testing at [phone].    Common restrictions after surgery: No bending, lifting, or twisting ("BLT"). Avoid lifting objects heavier than 10 pounds for the first 6 weeks after surgery. Where possible, avoid household activities that involve lifting, bending, reaching, pushing, or pulling such as laundry, vacuuming, grocery shopping, and childcare. Try to arrange for help from friends and family for these activities while you heal. Do not drive while taking prescription pain medication. Weeks 6 through 12 after surgery: avoid lifting more than 25 pounds.     How to contact us:  If you have any questions/concerns before or after surgery, you can reach [name] at [phone], or you can send a mychart message. We can be reached by phone or mychart 8am-4pm, Monday-Friday.  *Please note: Calls after 4pm are forwarded  to a third party answering service. Mychart messages are not routinely monitored during evenings, weekends, and holidays. Please call our office to contact the answering service for urgent concerns during non-business hours.    If you have FMLA/disability paperwork, please drop it off or fax it to [phone], attention Patty.   Appointments/FMLA & disability paperwork: Joycelyn Rua, & Flonnie Hailstone Registered Nurse/Surgery scheduler: Royston Cowper Medical Assistants: Nash Mantis Physician Assistants: Joan Flores, PA-C, Manning Charity, PA-C & Drake Leach, PA-C Surgeons: Venetia Night, MD & Ernestine Mcmurray, MD

## 2023-11-27 DIAGNOSIS — M961 Postlaminectomy syndrome, not elsewhere classified: Secondary | ICD-10-CM | POA: Insufficient documentation

## 2023-11-30 ENCOUNTER — Encounter: Attending: Physical Medicine and Rehabilitation | Admitting: Physical Medicine and Rehabilitation

## 2023-11-30 ENCOUNTER — Encounter: Payer: Self-pay | Admitting: Physical Medicine and Rehabilitation

## 2023-11-30 VITALS — BP 135/83 | HR 78 | Ht 64.0 in | Wt 271.0 lb

## 2023-11-30 DIAGNOSIS — G43709 Chronic migraine without aura, not intractable, without status migrainosus: Secondary | ICD-10-CM | POA: Diagnosis present

## 2023-11-30 DIAGNOSIS — M51362 Other intervertebral disc degeneration, lumbar region with discogenic back pain and lower extremity pain: Secondary | ICD-10-CM | POA: Diagnosis present

## 2023-11-30 DIAGNOSIS — G894 Chronic pain syndrome: Secondary | ICD-10-CM

## 2023-11-30 DIAGNOSIS — G8222 Paraplegia, incomplete: Secondary | ICD-10-CM | POA: Diagnosis present

## 2023-11-30 DIAGNOSIS — M7918 Myalgia, other site: Secondary | ICD-10-CM

## 2023-11-30 DIAGNOSIS — M961 Postlaminectomy syndrome, not elsewhere classified: Secondary | ICD-10-CM | POA: Diagnosis not present

## 2023-11-30 MED ORDER — GABAPENTIN 300 MG PO CAPS: 300.0000 mg | ORAL_CAPSULE | Freq: Every day | ORAL | 1 refills | Status: DC

## 2023-11-30 MED ORDER — LIDOCAINE HCL 1 % IJ SOLN
6.0000 mL | Freq: Once | INTRAMUSCULAR | Status: AC
Start: 2023-11-30 — End: 2023-11-30
  Administered 2023-11-30: 6 mL

## 2023-11-30 MED ORDER — TOPIRAMATE 100 MG PO TABS: ORAL_TABLET | ORAL | 1 refills | Status: DC

## 2023-11-30 MED ORDER — BACLOFEN 10 MG PO TABS: 10.0000 mg | ORAL_TABLET | Freq: Three times a day (TID) | ORAL | 1 refills | Status: AC

## 2023-11-30 NOTE — Patient Instructions (Signed)
 Plan: Can only do CT's after a spinal cord stimulator in most cases- would ask Dr Ernestine Mcmurray just in case.   2. Con't Duloxetine- 120 mg daily- last filled 08/22/23   3. Con't Baclofen 10 mg TID for spasms- # 270- 1 refill   4 Con't Topamax 100 mg nightly for migraines/HA's- 6 months supply  5. Patient here for trigger point injections for  Consent done and on chart.  Cleaned areas with alcohol and injected using a 27 gauge 1.5 inch needle  Injected 4.5cc- wasted 1.5cc Using 1% Lidocaine with no EPI  A lot more pain with injections than normal-  Upper traps B/L  Levators- B/L  Posterior scalenes Middle scalenes- B/L  Splenius Capitus- didn't want Pectoralis Major Rhomboids- B/L x2 Infraspinatus Teres Major/minor Thoracic paraspinals B/L  Lumbar paraspinals- B/L  Other injections-    Patient's level of pain prior was 6.5/10 Current level of pain after injections is- eased up a little  There was no bleeding or complications.  Patient was advised to drink a lot of water on day after injections to flush system Will have increased soreness for 12-48 hours after injections.  Can use Lidocaine patches the day AFTER injections Can use theracane on day of injections in places didn't inject Can use heating pad 4-6 hours AFTER injections  6. Discussed grieving from cat passing away.  Discussed getting another cat.  Needs more emotional support.  Look at [email or web address].com  7.  F/U in  3 months- double appt- SCI

## 2023-11-30 NOTE — Addendum Note (Signed)
 Addended by: Silas Sacramento T on: 11/30/2023 03:43 PM   Modules accepted: Orders

## 2023-11-30 NOTE — Progress Notes (Signed)
 Pt is a 53 yr old female with paraparesis/incomplete paraplegia due to attempted spinal cord stimulator placement- With chronic hx of orthostasis, BMI of 41 - back dwon from 45-  s/p gastric sleeve, Acute on chronic renal issues- CKD stage II,  here for f/u on SCI and neurogenic orthostatic hypotension. Vestibular issues due to trigger points and central issues.  Also had surgery on back in December 2023- L2-L3 laminectomy- at [facility]- Dr Sharolyn Douglas- Spine and Scoliosis- in [place], is Ortho spine per pt.  Here for f/u on ASIA D incomplete paraplegia and chronic pain.   Here for trigger point injections as well.    Needs psych eval for spinal cord stimulator paddles, not guide wires.    Still having pain in back- mid to low back into legs.  Still having neck pain that wants injections for as well as as mid/low back.    Is "OK"-  Dad had heart surgery- - stents in Going through that.    Trying to stay healthy.  Breast CA treatment going well-  On Femara now- for 5 years  Larey Seat- last week- "over [employer] 3x in last month.   B/c cannot lift R leg- like doesn't feel like it's there.  Did more MRI's and more scans-  Said "its' deteriorated a lot- looking at MRI reports, it's deteriorated slightly, but not severely since last MRI's.     Plan: Can only do CT's after a spinal cord stimulator in most cases- would ask Dr Ernestine Mcmurray just in case.   2. Con't Duloxetine- 120 mg daily- last filled 08/22/23   3. Con't Baclofen 10 mg TID for spasms- # 270- 1 refill   4 Con't Topamax 100 mg nightly for migraines/HA's- 6 months supply  5. Patient here for trigger point injections for  Consent done and on chart.  Cleaned areas with alcohol and injected using a 27 gauge 1.5 inch needle  Injected 4.5cc- wasted 1.5cc Using 1% Lidocaine with no EPI  A lot more pain with injections than normal-  Upper traps B/L  Levators- B/L  Posterior scalenes Middle scalenes- B/L   Splenius Capitus- didn't want Pectoralis Major Rhomboids- B/L x2 Infraspinatus Teres Major/minor Thoracic paraspinals B/L  Lumbar paraspinals- B/L  Other injections-    Patient's level of pain prior was 6.5/10 Current level of pain after injections is- eased up a little  There was no bleeding or complications.  Patient was advised to drink a lot of water on day after injections to flush system Will have increased soreness for 12-48 hours after injections.  Can use Lidocaine patches the day AFTER injections Can use theracane on day of injections in places didn't inject Can use heating pad 4-6 hours AFTER injections  6. Discussed grieving from cat passing away.  Discussed getting another cat.  Needs more emotional support.  Look at [email or web address].com  7.  F/U in  3 months- double appt- SCI   I spent a total of 28   minutes on total care today- >50% coordination of care- due to grieving, refills, and injections- 8 minutes on injections

## 2023-12-03 ENCOUNTER — Other Ambulatory Visit: Payer: Self-pay | Admitting: Internal Medicine

## 2023-12-19 ENCOUNTER — Encounter: Payer: Self-pay | Admitting: Surgery

## 2023-12-19 ENCOUNTER — Ambulatory Visit: Admitting: Surgery

## 2023-12-19 VITALS — BP 132/82 | HR 73 | Temp 97.4°F | Ht 64.0 in | Wt 266.8 lb

## 2023-12-19 DIAGNOSIS — Z853 Personal history of malignant neoplasm of breast: Secondary | ICD-10-CM

## 2023-12-19 DIAGNOSIS — Z170 Estrogen receptor positive status [ER+]: Secondary | ICD-10-CM

## 2023-12-19 DIAGNOSIS — C50412 Malignant neoplasm of upper-outer quadrant of left female breast: Secondary | ICD-10-CM

## 2023-12-19 NOTE — Patient Instructions (Addendum)
 Breast Reconstruction With Implant or Tissue Expander Insertion, Care After The following information offers guidance on how to care for yourself after your procedure. Your health care provider may also give you more specific instructions. If you have problems or questions, contact your health care provider. What can I expect after the procedure? After the procedure, it is common to have: Pain or tenderness. Tiredness. Bruising or swelling. Scarring. Drainage. Loss of sensation or changed sensation in the reconstructed breast or nipple area. A breast that has an abnormal shape or feel. The shape and feel of the reconstructed breast will improve over time as the swelling decreases. Follow these instructions at home: Incision care  Keep the area around your incisions clean by gently washing with soap, water, and a gauze pad or washcloth or as told by your health care provider. Follow instructions from your health care provider about how to take care of your incisions. Make sure you: Wash your hands with soap and water for at least 20 seconds before you change your bandage (dressing). If soap and water are not available, use hand sanitizer. Change your dressing as told by your health care provider. Leave stitches (sutures), skin glue, or adhesive strips in place. These skin closures may need to stay in place for 2 weeks or longer. If adhesive strip edges start to loosen and curl up, you may trim the loose edges. Do not remove adhesive strips completely unless your health care provider tells you to do that. Check your incision area every day for signs of infection. Check for: Redness, swelling, or pain. Fluid or blood. Warmth. Pus or a bad smell. If you were sent home with a surgical drain in place, follow instructions from your health care provider about: How to empty it. How to care for it at home. Activity  Take short walks every 1-2 hours during the day. Ask for help if needed. Do not  lift your arms above your head for 4-6 weeks. Avoid activities that take a lot of effort. Do not lift anything that is heavier than 10 lb (4.5 kg), or the limit that you are told, until your health care provider says that it is safe. Bathing Do not take baths, swim, or use a hot tub while the drains are in place. After the drains are removed, you may shower if your health care provider approves. You may only be allowed to take sponge baths. General instructions Do not use any products that contain nicotine or tobacco. These products include cigarettes, chewing tobacco, and vaping devices, such as e-cigarettes. These can delay incision healing after surgery. If you need help quitting, ask your health care provider. Take over-the-counter and prescription medicines only as told by your health care provider. Wear a supportive undergarment or bra as directed by your health care provider. Ask your health care provider if the medicine prescribed to you: Requires you to avoid driving or using machinery. Can cause constipation. You may need to take these actions to prevent or treat constipation: Drink enough fluid to keep your urine pale yellow. Take over-the-counter or prescription medicines. Eat foods that are high in fiber, such as beans, whole grains, and fresh fruits and vegetables. Limit foods that are high in fat and processed sugars, such as fried and sweet foods. Join a support group for people who have had breast reconstruction. Talk with a mental health professional if you are having trouble coping with your emotions. Keep all follow-up visits. This is important. Contact a health care  provider if: You have a fever. You have redness, swelling, or pain in your incision area. You have fluid or blood coming from your incision. Your incision area feels warm to the touch. You have pus or a bad smell coming from your incision. Your pain is not going away or it is getting worse. You notice unusual  lumps or changes in the area where you had surgery. Get help right away if: You feel short of breath. You have an irregular heartbeat. You have chest pain. These symptoms may be an emergency. Get help right away. Call 911. Do not wait to see if the symptoms will go away. Do not drive yourself to the hospital. Summary After the procedure, it is common to have some pain and tenderness. Keep the area around your incisions clean by gently washing with soap, water, and a gauze pad or washcloth or as told by your health care provider. Check your incision area every day for signs of infection. Contact a health care provider if you have any signs of infection, your pain is not going away, or you notice unusual lumps or changes in the area where you had surgery. Keep all follow-up visits. This is important. This information is not intended to replace advice given to you by your health care provider. Make sure you discuss any questions you have with your health care provider. Document Revised: 06/22/2021 Document Reviewed: 06/22/2021 Elsevier Patient Education  2024 [employer].

## 2023-12-19 NOTE — Progress Notes (Unsigned)
 Outpatient Surgical Follow Up  12/19/2023  Vanessa Medina is an 53 y.o. female.   Chief Complaint  Patient presents with   Follow-up    6 month f/u bil mastectomy     HPI: Vanessa Medina does have a history of invasive left breast cancer status post bilateral mastectomies with sentinel lymph node biopsy on 01/18/23, she did have immediate reconstruction andhad definitive implants placed.  She had some issues with seroma on the left side.  She does have chronic pain and please note that this was baseline prior to any mastectomies. She feels weak.. Overall she is glad that she is cancer free.    Past Medical History:  Diagnosis Date   Acute nontraumatic kidney injury (HCC)    Anemia    Anxiety    Benign cyst of kidney    Breast cancer (HCC) 01/18/2023   left breast   Cervical cancer (HCC)    Chronic hypotension    Chronic kidney disease    Previous now clear   Claustrophobia    Decompression injury of spinal cord    Depression    Family history of adverse reaction to anesthesia     " my mother takes a long time time wake up"   History of dysplastic nevus 01/18/2018   right shoulder, recurrent dysplastic nevus   History of kidney stones    History of shingles    Migraine    Multiple allergies    Obesity    Osteoarthritis    left hip   Pneumonia    PONV (postoperative nausea and vomiting)    slow to wake up   Sleep apnea    does not wear CPAP   Slow to wake up after anesthesia    Stroke (HCC) 08/11/2020   spinal cord stroke, spinal cord puncture during surgery   Wears glasses     Past Surgical History:  Procedure Laterality Date   ABDOMINAL HYSTERECTOMY     APPENDECTOMY     BACK SURGERY     BILIOPANCREATIC DIVISION W DUODENAL SWITCH N/A    BREAST BIOPSY Left 12/06/2022   u/s bx,1:00 heart clip, path pending   BREAST BIOPSY Left 12/06/2022   [name] bx 2:00 ribbon clip path pending   BREAST BIOPSY Left 12/06/2022   [name] LT BREAST BX W LOC DEV EA ADD LESION IMG BX SPEC US  GUIDE 12/06/2022 [facility]   BREAST BIOPSY Left 12/06/2022   [name] LT BREAST BX W LOC DEV 1ST LESION IMG BX SPEC US GUIDE 12/06/2022 [facility]   BREAST RECONSTRUCTION WITH PLACEMENT OF TISSUE EXPANDER AND FLEX HD (ACELLULAR HYDRATED DERMIS) Bilateral 01/18/2023   Procedure: IMMEDIATE LEFT BREAST RECONSTRUCTION WITH PLACEMENT OF TISSUE EXPANDER AND FLEX HD (ACELLULAR HYDRATED DERMIS);  Surgeon: Peggye Form, DO;  Location: [facility];  Service: Plastics;  Laterality: Bilateral;   BREAST RECONSTRUCTION WITH PLACEMENT OF TISSUE EXPANDER AND FLEX HD (ACELLULAR HYDRATED DERMIS) Left 03/26/2023   Procedure: BREAST RECONSTRUCTION WITH PLACEMENT OF TISSUE EXPANDER;  Surgeon: Peggye Form, DO;  Location: [facility];  Service: Plastics;  Laterality: Left;   CHOLECYSTECTOMY     COLONOSCOPY N/A 06/27/2021   Procedure: COLONOSCOPY;  Surgeon: Jaynie Collins, DO;  Location: [facility];  Service: Gastroenterology;  Laterality: N/A;   COLONOSCOPY N/A 08/27/2023   Procedure: COLONOSCOPY;  Surgeon: Jaynie Collins, DO;  Location: [facility];  Service: Gastroenterology;  Laterality: N/A;   DIAGNOSTIC LAPAROSCOPY     LOA   ESOPHAGOGASTRODUODENOSCOPY N/A 06/27/2021  Procedure: ESOPHAGOGASTRODUODENOSCOPY (EGD);  Surgeon: Quintin Buckle, DO;  [facility]COPY;  Service: Gastroenterology;  Laterality: N/A;   ESOPHAGOGASTRODUODENOSCOPY (EGD) WITH PROPOFOL N/A 07/25/2017   Procedure: ESOPHAGOGASTRODUODENOSCOPY (EGD) WITH PROPOFOL;  Surgeon: Cassie Click, MD;  Location: [facility] ENDOSCOPY;  Service: Endoscopy;  Laterality: N/A;   FLEXIBLE BRONCHOSCOPY     HERNIA REPAIR     KNEE ARTHROSCOPY     LAPAROSCOPIC GASTRIC SLEEVE RESECTION WITH HIATAL HERNIA REPAIR     LUMBAR FUSION  08/29/2022   Revision Lumbar two through five Laminectomy & Fusion with Transforaminal Lumbar interbody fusion   MASTECTOMY W/ SENTINEL NODE BIOPSY Bilateral 01/18/2023    Procedure: MASTECTOMY WITH SENTINEL LYMPH NODE BIOPSY, RNFA to assist;  Surgeon: Alben Alma, MD;  Location: [facility];  Service: General;  Laterality: Bilateral;   REMOVAL OF TISSUE EXPANDER AND PLACEMENT OF IMPLANT Bilateral 06/06/2023   Procedure: REMOVAL OF TISSUE EXPANDER AND PLACEMENT OF IMPLANT;  Surgeon: Thornell Flirt, DO;  Location: [facility];  Service: Plastics;  Laterality: Bilateral;   SPINAL CORD STIMULATOR IMPLANT     TONSILLECTOMY     TOTAL HIP ARTHROPLASTY Left 01/26/2016   Procedure: LEFT TOTAL HIP ARTHROPLASTY ANTERIOR APPROACH;  Surgeon: Wes Hamman, MD;  Location: [facility];  Service: Orthopedics;  Laterality: Left;   TRANSFORAMINAL LUMBAR INTERBODY FUSION (TLIF) WITH PEDICLE SCREW FIXATION 3 LEVEL  08/2019   Amalia Badder, MD L2-L5    Family History  Problem Relation Age of Onset   Renal Disease Mother    Hypertension Mother    Sudden Cardiac Death Mother    Heart failure Mother    Valvular heart disease Mother    Heart disease Mother    Breast cancer Maternal Grandmother    Stroke Brother    Heart attack Brother 19   Diabetes Other     Social History:  reports that she has never smoked. She has never been exposed to tobacco smoke. She has never used smokeless tobacco. She reports that she does not currently use alcohol. She reports that she does not use drugs.  Allergies:  Allergies  Allergen Reactions   Bee Venom Swelling   Iodinated Contrast Media Anaphylaxis    Swelling  Other reaction(s): Other (See Comments)  Swelling   Swelling   Latex Hives and Rash   Morphine Other (See Comments)    BRADYCARDIA   Shellfish Allergy Anaphylaxis   Codeine Nausea And Vomiting   Meperidine Hcl Other (See Comments)    BRADYCARDIA   Bee Pollen Other (See Comments)    Unknown   Oxycodone Hives and Itching    Blisters on back   Tape Hives    Adhesive   Pentazocine Lactate Nausea And Vomiting    REACTION: vomiting with Talwin NX    Propoxyphene Nausea Only and Nausea And Vomiting    Medications reviewed.    ROS Full ROS performed and is otherwise negative other than what is stated in HPI   BP 132/82   Pulse 73   Temp (!) 97.4 F (36.3 C) (Oral)   Ht 5\' 4"  (1.626 m)   Wt 266 lb 12.8 oz (121 kg)   SpO2 98%   BMI 45.80 kg/m   Physical Exam Vitals and nursing note reviewed. Exam conducted with a chaperone present.  Constitutional:      General: She is not in acute distress.    Appearance: Normal appearance.  Cardiovascular:     Rate and Rhythm: Normal rate and regular  rhythm.  Pulmonary:     Effort: Pulmonary effort is normal. No respiratory distress.     Breath sounds: Normal breath sounds. No stridor.     Comments: BREAST: BIlateral mastectomy scars, No evidence of chest wall masses, Breast implants bilaterally. Axillas free of disease, No obvious evidence of lymphedema or infection Abdominal:     General: Abdomen is flat. There is no distension.     Palpations: Abdomen is soft. There is no mass.     Tenderness: There is no abdominal tenderness. There is no rebound.     Hernia: No hernia is present.  Musculoskeletal:        General: Normal range of motion.     Cervical back: Normal range of motion and neck supple.  Skin:    General: Skin is warm and dry.  Neurological:     Mental Status: She is alert.  Psychiatric:        Mood and Affect: Mood normal.        Behavior: Behavior normal.        Thought Content: Thought content normal.        Judgment: Judgment normal.   A/P  53 year old female history of invasive left breast cancer status post bilateral mastectomies with sentinel lymph node biopsy on 01/18/23, she did have immediate reconstruction , no evidence of recurrence or complications related to mastectomies. I will be happy to see her in 1 year. Please note I spent 20 minutes in this encounter including extensive review of medical records, personally reviewing operative reports from plastic  surgery, placing orders, counseling the patient and performing documentation     Evelia Hipp, MD [facility] General Surgeon

## 2023-12-25 ENCOUNTER — Ambulatory Visit: Payer: Medicaid Other | Admitting: Internal Medicine

## 2023-12-25 ENCOUNTER — Other Ambulatory Visit: Payer: Medicaid Other

## 2023-12-26 ENCOUNTER — Telehealth: Payer: Self-pay | Admitting: Urology

## 2023-12-26 NOTE — Telephone Encounter (Signed)
 Dr. Clarke Crouch referred patient to [facility], and patient called because she has not heard from them. I gave her their phone number and she called to try and schedule her appointment. She was told that they have not received a referral for her. Can you please resend? Their fax number is [phone]

## 2023-12-30 NOTE — Progress Notes (Signed)
 MRN : [id number]  Vanessa Medina is a 53 y.o. (17-Jun-1971) female who presents with chief complaint of legs swell.  History of Present Illness:   The patient returns to the office for followup evaluation regarding leg swelling.  The swelling has persisted and the pain associated with swelling continues. There have not been any interval development of a ulcerations or wounds.  Since the previous visit the patient has been wearing graduated compression stockings and has noted little if any improvement in the lymphedema. The patient has been using compression routinely morning until night.  The patient also states elevation during the day and exercise is being done too.  No outpatient medications have been marked as taking for the 12/31/23 encounter (Appointment) with Prescilla Brod, Ninette Basque, MD.    Past Medical History:  Diagnosis Date   Acute nontraumatic kidney injury (HCC)    Anemia    Anxiety    Benign cyst of kidney    Breast cancer (HCC) 01/18/2023   left breast   Cervical cancer (HCC)    Chronic hypotension    Chronic kidney disease    Previous now clear   Claustrophobia    Decompression injury of spinal cord    Depression    Family history of adverse reaction to anesthesia     " my mother takes a long time time wake up"   History of dysplastic nevus 01/18/2018   right shoulder, recurrent dysplastic nevus   History of kidney stones    History of shingles    Migraine    Multiple allergies    Obesity    Osteoarthritis    left hip   Pneumonia    PONV (postoperative nausea and vomiting)    slow to wake up   Sleep apnea    does not wear CPAP   Slow to wake up after anesthesia    Stroke (HCC) 08/11/2020   spinal cord stroke, spinal cord puncture during surgery   Wears glasses     Past Surgical History:  Procedure Laterality Date   ABDOMINAL HYSTERECTOMY     APPENDECTOMY     BACK SURGERY     BILIOPANCREATIC DIVISION W DUODENAL  SWITCH N/A    BREAST BIOPSY Left 12/06/2022   u/s bx,1:00 heart clip, path pending   BREAST BIOPSY Left 12/06/2022   us  bx 2:00 ribbon clip path pending   BREAST BIOPSY Left 12/06/2022   US  LT BREAST BX W LOC DEV EA ADD LESION IMG BX SPEC US  GUIDE 12/06/2022 [facility]   BREAST BIOPSY Left 12/06/2022   US  LT BREAST BX W LOC DEV 1ST LESION IMG BX SPEC US  GUIDE 12/06/2022 [facility]   BREAST RECONSTRUCTION WITH PLACEMENT OF TISSUE EXPANDER AND FLEX HD (ACELLULAR HYDRATED DERMIS) Bilateral 01/18/2023   Procedure: IMMEDIATE LEFT BREAST RECONSTRUCTION WITH PLACEMENT OF TISSUE EXPANDER AND FLEX HD (ACELLULAR HYDRATED DERMIS);  Surgeon: Thornell Flirt, DO;  Location: [facility];  Service: Plastics;  Laterality: Bilateral;   BREAST RECONSTRUCTION WITH PLACEMENT OF TISSUE EXPANDER AND FLEX HD (ACELLULAR HYDRATED DERMIS) Left 03/26/2023   Procedure: BREAST RECONSTRUCTION WITH PLACEMENT OF TISSUE EXPANDER;  Surgeon: Thornell Flirt, DO;  Location: [facility];  Service: Plastics;  Laterality: Left;   CHOLECYSTECTOMY     COLONOSCOPY N/A 06/27/2021   Procedure: COLONOSCOPY;  Surgeon: Quintin Buckle, DO;  Location: [facility];  Service: Gastroenterology;  Laterality: N/A;   COLONOSCOPY N/A 08/27/2023   Procedure: COLONOSCOPY;  Surgeon: Quintin Buckle, DO;  Location: [facility];  Service: Gastroenterology;  Laterality: N/A;   DIAGNOSTIC LAPAROSCOPY     LOA   ESOPHAGOGASTRODUODENOSCOPY N/A 06/27/2021   Procedure: ESOPHAGOGASTRODUODENOSCOPY (EGD);  Surgeon: Quintin Buckle, DO;  Location: [facility];  Service: Gastroenterology;  Laterality: N/A;   ESOPHAGOGASTRODUODENOSCOPY (EGD) WITH PROPOFOL  N/A 07/25/2017   Procedure: ESOPHAGOGASTRODUODENOSCOPY (EGD) WITH PROPOFOL ;  Surgeon: Cassie Click, MD;  Location: [facility] ENDOSCOPY;  Service: Endoscopy;  Laterality: N/A;   FLEXIBLE BRONCHOSCOPY     HERNIA REPAIR     KNEE ARTHROSCOPY     LAPAROSCOPIC  GASTRIC SLEEVE RESECTION WITH HIATAL HERNIA REPAIR     LUMBAR FUSION  08/29/2022   Revision Lumbar two through five Laminectomy & Fusion with Transforaminal Lumbar interbody fusion   MASTECTOMY W/ SENTINEL NODE BIOPSY Bilateral 01/18/2023   Procedure: MASTECTOMY WITH SENTINEL LYMPH NODE BIOPSY, RNFA to assist;  Surgeon: Alben Alma, MD;  Location: [facility];  Service: General;  Laterality: Bilateral;   REMOVAL OF TISSUE EXPANDER AND PLACEMENT OF IMPLANT Bilateral 06/06/2023   Procedure: REMOVAL OF TISSUE EXPANDER AND PLACEMENT OF IMPLANT;  Surgeon: Thornell Flirt, DO;  Location: [facility];  Service: Plastics;  Laterality: Bilateral;   SPINAL CORD STIMULATOR IMPLANT     TONSILLECTOMY     TOTAL HIP ARTHROPLASTY Left 01/26/2016   Procedure: LEFT TOTAL HIP ARTHROPLASTY ANTERIOR APPROACH;  Surgeon: Wes Hamman, MD;  Location: [facility];  Service: Orthopedics;  Laterality: Left;   TRANSFORAMINAL LUMBAR INTERBODY FUSION (TLIF) WITH PEDICLE SCREW FIXATION 3 LEVEL  08/2019   Amalia Badder, MD L2-L5    Social History Social History   Tobacco Use   Smoking status: Never    Passive exposure: Never   Smokeless tobacco: Never  Vaping Use   Vaping status: Never Used  Substance Use Topics   Alcohol use: Not Currently    Comment: occasional wine   Drug use: No    Family History Family History  Problem Relation Age of Onset   Renal Disease Mother    Hypertension Mother    Sudden Cardiac Death Mother    Heart failure Mother    Valvular heart disease Mother    Heart disease Mother    Breast cancer Maternal Grandmother    Stroke Brother    Heart attack Brother 46   Diabetes Other     Allergies  Allergen Reactions   Bee Venom Swelling   Iodinated Contrast Media Anaphylaxis    Swelling  Other reaction(s): Other (See Comments)  Swelling   Swelling   Latex Hives and Rash   Morphine  Other (See Comments)    BRADYCARDIA   Shellfish Allergy Anaphylaxis   Codeine  Nausea  And Vomiting   Meperidine Hcl Other (See Comments)    BRADYCARDIA   Bee Pollen Other (See Comments)    Unknown   Oxycodone  Hives and Itching    Blisters on back   Tape Hives    Adhesive   Pentazocine Lactate Nausea And Vomiting    REACTION: vomiting with Talwin NX   Propoxyphene Nausea Only and Nausea And Vomiting     REVIEW OF SYSTEMS (Negative unless checked)  Constitutional: [] Weight loss  [] Fever  [] Chills Cardiac: [] Chest pain   [] Chest pressure   [] Palpitations   [] Shortness of breath when laying flat   [] Shortness of breath with exertion. Vascular:  [] Pain in  legs with walking   [x] Pain in legs with standing  [] History of DVT   [] Phlebitis   [x] Swelling in legs   [] Varicose veins   [] Non-healing ulcers Pulmonary:   [] Uses home oxygen   [] Productive cough   [] Hemoptysis   [] Wheeze  [] COPD   [x] Asthma Neurologic:  [] Dizziness   [] Seizures   [] History of stroke   [] History of TIA  [] Aphasia   [] Vissual changes   [] Weakness or numbness in arm   [] Weakness or numbness in leg Musculoskeletal:   [] Joint swelling   [x] Joint pain   [x] Low back pain Hematologic:  [] Easy bruising  [] Easy bleeding   [] Hypercoagulable state   [] Anemic Gastrointestinal:  [] Diarrhea   [] Vomiting  [x] Gastroesophageal reflux/heartburn   [] Difficulty swallowing. Genitourinary:  [] Chronic kidney disease   [] Difficult urination  [] Frequent urination   [] Blood in urine Skin:  [] Rashes   [] Ulcers  Psychological:  [] History of anxiety   [x]  History of major depression.  Physical Examination  There were no vitals filed for this visit. There is no height or weight on file to calculate BMI. Gen: WD/WN, NAD Head: [place]/AT, No temporalis wasting.  Ear/Nose/Throat: Hearing grossly intact, nares w/o erythema or drainage, pinna without lesions Eyes: PER, EOMI, sclera nonicteric.  Neck: Supple, no gross masses.  No JVD.  Pulmonary:  Good air movement, no audible wheezing, no use of accessory muscles.  Cardiac: RRR,  precordium not hyperdynamic. Vascular:  scattered varicosities present bilaterally.  Mild venous stasis changes to the legs bilaterally.  3-4+ soft pitting edema, CEAP C4sEpAsPr  Vessel Right Left  Radial Palpable Palpable  Gastrointestinal: soft, non-distended. No guarding/no peritoneal signs.  Musculoskeletal: M/S 5/5 throughout.  No deformity.  Neurologic: CN 2-12 intact. Pain and light touch intact in extremities.  Symmetrical.  Speech is fluent. Motor exam as listed above. Psychiatric: Judgment intact, Mood & affect appropriate for pt's clinical situation. Dermatologic: Venous rashes no ulcers noted.  No changes consistent with cellulitis. Lymph : No lichenification or skin changes of chronic lymphedema.  CBC Lab Results  Component Value Date   WBC 3.5 (L) 09/07/2023   HGB 12.3 09/07/2023   HCT 38.5 09/07/2023   MCV 99.5 09/07/2023   PLT 273 09/07/2023    BMET    Component Value Date/Time   NA 140 09/07/2023 1031   NA 142 06/24/2014 0522   K 3.8 09/07/2023 1031   K 4.0 06/24/2014 0522   CL 111 09/07/2023 1031   CL 107 06/24/2014 0522   CO2 22 09/07/2023 1031   CO2 28 06/24/2014 0522   GLUCOSE 102 (H) 09/07/2023 1031   GLUCOSE 110 (H) 06/24/2014 0522   BUN 16 09/07/2023 1031   BUN 4 (L) 06/24/2014 0522   CREATININE 0.94 09/07/2023 1031   CREATININE 0.90 08/06/2023 1353   CREATININE 0.84 06/24/2014 0522   CALCIUM  8.9 09/07/2023 1031   CALCIUM  7.9 (L) 06/24/2014 0522   GFRNONAA >60 09/07/2023 1031   GFRNONAA >60 08/06/2023 1353   GFRNONAA >60 06/24/2014 0522   GFRNONAA >60 06/24/2013 1056   GFRAA >60 09/06/2019 1258   GFRAA >60 06/24/2014 0522   GFRAA >60 06/24/2013 1056   CrCl cannot be calculated (Patient's most recent lab result is older than the maximum 21 days allowed.).  COAG Lab Results  Component Value Date   INR 1.0 08/22/2019   INR 1.09 01/21/2016   INR 0.9 06/24/2013    Radiology No results found.   Assessment/Plan 1. Lymphedema  (Primary) Recommend:  No surgery or intervention at  this point in time.   The Patient is CEAP C4sEpAsPr.  The patient has been wearing compression for more than 12 weeks with no or little benefit.  The patient has been exercising daily for more than 12 weeks. The patient has been elevating and taking OTC pain medications for more than 12 weeks.  None of these have have eliminated the pain related to the lymphedema or the discomfort regarding excessive swelling and venous congestion.    I have reviewed my discussion with the patient regarding lymphedema and why it  causes symptoms.  Patient will continue wearing graduated compression on a daily basis. The patient should put the compression on first thing in the morning and removing them in the evening. The patient should not sleep in the compression.   In addition, behavioral modification throughout the day will be continued.  This will include frequent elevation (such as in a recliner), use of over the counter pain medications as needed and exercise such as walking.  The systemic causes for chronic edema such as liver, kidney and cardiac etiologies do not appear to have significant changed over the past year.    The patient has chronic , severe lymphedema with hyperpigmentation of the skin and has done MLD, skin care, medication, diet, exercise, elevation and compression for 4 weeks with no improvement,  I am recommending a lymphedema pump.  The patient still has stage 3 lymphedema and therefore, I believe that a lymph pump is needed to improve the control of the patient's lymphedema and improve the quality of life.  Additionally, a lymph pump is warranted because it will reduce the risk of cellulitis and ulceration in the future.  Patient should follow-up in six months   2. Pain and swelling of lower leg, unspecified laterality Recommend:  No surgery or intervention at this point in time.   The Patient is CEAP C4sEpAsPr.  The patient has  been wearing compression for more than 12 weeks with no or little benefit.  The patient has been exercising daily for more than 12 weeks. The patient has been elevating and taking OTC pain medications for more than 12 weeks.  None of these have have eliminated the pain related to the lymphedema or the discomfort regarding excessive swelling and venous congestion.    I have reviewed my discussion with the patient regarding lymphedema and why it  causes symptoms.  Patient will continue wearing graduated compression on a daily basis. The patient should put the compression on first thing in the morning and removing them in the evening. The patient should not sleep in the compression.   In addition, behavioral modification throughout the day will be continued.  This will include frequent elevation (such as in a recliner), use of over the counter pain medications as needed and exercise such as walking.  The systemic causes for chronic edema such as liver, kidney and cardiac etiologies do not appear to have significant changed over the past year.    The patient has chronic , severe lymphedema with hyperpigmentation of the skin and has done MLD, skin care, medication, diet, exercise, elevation and compression for 4 weeks with no improvement,  I am recommending a lymphedema pump.  The patient still has stage 3 lymphedema and therefore, I believe that a lymph pump is needed to improve the control of the patient's lymphedema and improve the quality of life.  Additionally, a lymph pump is warranted because it will reduce the risk of cellulitis and ulceration in the future.  Patient should follow-up in six months   3. Uncomplicated asthma, unspecified asthma severity, unspecified whether persistent Continue pulmonary medications and aerosols as already ordered, these medications have been reviewed and there are no changes at this time.   4. Degeneration of intervertebral disc of lumbar region with discogenic back  pain and lower extremity pain Continue medications to treat the patient's degenerative disease as already ordered, these medications have been reviewed and there are no changes at this time.  Continued activity and therapy was stressed.    Devon Fogo, MD  12/30/2023 10:35 AM

## 2023-12-31 ENCOUNTER — Encounter (INDEPENDENT_AMBULATORY_CARE_PROVIDER_SITE_OTHER): Payer: Self-pay | Admitting: Vascular Surgery

## 2023-12-31 ENCOUNTER — Other Ambulatory Visit: Payer: Self-pay | Admitting: Physician Assistant

## 2023-12-31 ENCOUNTER — Ambulatory Visit (INDEPENDENT_AMBULATORY_CARE_PROVIDER_SITE_OTHER): Payer: Medicaid Other | Admitting: Vascular Surgery

## 2023-12-31 ENCOUNTER — Ambulatory Visit (INDEPENDENT_AMBULATORY_CARE_PROVIDER_SITE_OTHER)

## 2023-12-31 ENCOUNTER — Ambulatory Visit (INDEPENDENT_AMBULATORY_CARE_PROVIDER_SITE_OTHER): Admitting: Podiatry

## 2023-12-31 ENCOUNTER — Encounter: Payer: Self-pay | Admitting: Podiatry

## 2023-12-31 VITALS — BP 148/89 | HR 72 | Resp 18 | Ht 64.0 in | Wt 264.8 lb

## 2023-12-31 DIAGNOSIS — M7671 Peroneal tendinitis, right leg: Secondary | ICD-10-CM | POA: Diagnosis not present

## 2023-12-31 DIAGNOSIS — I890 Lymphedema, not elsewhere classified: Secondary | ICD-10-CM | POA: Diagnosis not present

## 2023-12-31 DIAGNOSIS — M7751 Other enthesopathy of right foot: Secondary | ICD-10-CM

## 2023-12-31 DIAGNOSIS — M7989 Other specified soft tissue disorders: Secondary | ICD-10-CM

## 2023-12-31 DIAGNOSIS — L603 Nail dystrophy: Secondary | ICD-10-CM

## 2023-12-31 DIAGNOSIS — J45909 Unspecified asthma, uncomplicated: Secondary | ICD-10-CM | POA: Diagnosis not present

## 2023-12-31 DIAGNOSIS — M51362 Other intervertebral disc degeneration, lumbar region with discogenic back pain and lower extremity pain: Secondary | ICD-10-CM

## 2023-12-31 DIAGNOSIS — M79669 Pain in unspecified lower leg: Secondary | ICD-10-CM | POA: Diagnosis not present

## 2023-12-31 NOTE — Patient Instructions (Addendum)
 VISIT SUMMARY:  Today, you were seen for right foot pain, particularly at the base of your fifth metatarsal. You have a history of plantar fasciitis and previous foot surgeries. We discussed your current symptoms, reviewed your medical history, and explored treatment options.  YOUR PLAN:  -INSERTIONAL PERONEAL TENDONITIS: This condition involves chronic pain where the peroneus brevis tendon attaches to the fifth metatarsal bone. We did not find any fractures. Surgical intervention is major and not preferred due to your history. Instead, we administered a corticosteroid injection to reduce inflammation, provided home physical therapy exercises to strengthen and stretch the tendon, and applied a ankle brace to reduce pressure.  -THICKENED TOENAIL: Your thickened toenail is likely due to previous nail removal and damage. Options include regular trimming by a pedicurist or permanent removal of the toenail and root to prevent regrowth. You are considering these options based on your current priorities and other health issues.  INSTRUCTIONS:  Please follow up with the recommended home physical therapy exercises and use the ankle brace as directed. If the thickened toenail becomes more bothersome, consider consulting for permanent removal. Schedule a follow-up appointment if your symptoms persist or worsen.  Peroneal Tendinopathy Rehab Ask your health care provider which exercises are safe for you. Do exercises exactly as told by your health care provider and adjust them as directed. It is normal to feel mild stretching, pulling, tightness, or discomfort as you do these exercises. Stop right away if you feel sudden pain or your pain gets worse. Do not begin these exercises until told by your health care provider. Stretching and range-of-motion exercises These exercises warm up your muscles and joints and improve the movement and flexibility of your ankle. These exercises also help to relieve pain and  stiffness. Gastroc and soleus stretch, standing  This is an exercise in which you stand on a step and use your body weight to stretch your calf muscles. To do this exercise: Stand on the edge of a step on the ball of your left / right foot. The ball of your foot is on the walking surface, right under your toes. Keep your other foot firmly on the same step. Hold on to the wall, a railing, or a chair for balance. Slowly lift your other foot, allowing your body weight to press your left / right heel down over the edge of the step. You should feel a stretch in your left / right calf (gastrocnemius and soleus). Hold this position for 15 seconds. Return both feet to the step. Repeat this exercise with a slight bend in your left / right knee. Repeat 5 times with your left / right knee straight and 5 times with your left / right knee bent. Complete this exercise 2 times a day. Strengthening exercises These exercises build strength and endurance in your foot and ankle. Endurance is the ability to use your muscles for a long time, even after they get tired. Ankle dorsiflexion with band   Secure a rubber exercise band or tube to an object, such as a table leg, that will not move when the band is pulled. Secure the other end of the band around your left / right foot. Sit on the floor, facing the object with your left / right leg extended. The band or tube should be slightly tense when your foot is relaxed. Slowly flex your left / right ankle and toes to bring your foot toward you (dorsiflexion). Hold this position for 15 seconds. Let the band or tube  slowly pull your foot back to the starting position. Repeat 5 times. Complete this exercise 2 times a day. Ankle eversion Sit on the floor with your legs straight out in front of you. Loop a rubber exercise band or tube around the ball of your left / right foot. The ball of your foot is on the walking surface, right under your toes. Hold the ends of the  band in your hands, or secure the band to a stable object. The band or tube should be slightly tense when your foot is relaxed. Slowly push your foot outward, away from your other leg (eversion). Hold this position for 15 seconds. Slowly return your foot to the starting position. Repeat 5 times. Complete this exercise 2 times a day. Plantar flexion, standing  This exercise is sometimes called standing heel raise. Stand with your feet shoulder-width apart. Place your hands on a wall or table to steady yourself as needed, but try not to use it for support. Keep your weight spread evenly over the width of your feet while you slowly rise up on your toes (plantar flexion). If told by your health care provider: Shift your weight toward your left / right leg until you feel challenged. Stand on your left / right leg only. Hold this position for 15 seconds. Repeat 2 times. Complete this exercise 2 times a day. Single leg stand Without shoes, stand near a railing or in a doorway. You may hold on to the railing or door frame as needed. Stand on your left / right foot. Keep your big toe down on the floor and try to keep your arch lifted. Do not roll to the outside of your foot. If this exercise is too easy, you can try it with your eyes closed or while standing on a pillow. Hold this position for 15 seconds. Repeat 5 times. Complete this exercise 2 times a day. This information is not intended to replace advice given to you by your health care provider. Make sure you discuss any questions you have with your health care provider. Document Revised: 12/10/2018 Document Reviewed: 12/10/2018 Elsevier Patient Education  2020 [employer].

## 2023-12-31 NOTE — Progress Notes (Signed)
 "  Subjective:  Patient ID: Vanessa Medina, female    DOB: 11/25/1970,  MRN: [id number]  Chief Complaint  Patient presents with   Foot Pain    NP Patient believes right foot may be bone spur    Discussed the use of AI scribe software for clinical note transcription with the patient, who gave verbal consent to proceed.  History of Present Illness Vanessa Medina is a 53 year old female with a history of plantar fasciitis and prior foot surgeries who presents with right foot pain.  She experiences intermittent pain in her right foot, specifically at the base of the fifth metatarsal, with varying severity that sometimes makes walking difficult. She has a history of plantar fasciitis and has undergone two surgeries on this foot, the last involving tendon cutting. She previously had a bone spur removed and is concerned about a similar issue currently. Her history includes a fracture involving the pinky toe and a toenail removal from the big toe, which required shaving 25% of the bone.  She has a history of a spinal cord injury, increasing her risk of falls. She is concerned about a thick toenail on her right foot, which she finds difficult to cut, attributing it to damage from previous nail removal. No plantar or lateral pain, and no pain in the retromalleolar or distal malleolar area of the peroneal tendons.      Objective:    Physical Exam VASCULAR: DP and PT pulse palpable. Foot is warm and well-perfused. Capillary fill time is brisk. DERMATOLOGIC: Normal skin turgor, texture, and temperature. No open lesions, rashes, or ulcerations. NEUROLOGIC: Normal sensation to light touch and pressure. No paresthesias on examination. ORTHOPEDIC: Pain on palpation at the fifth metatarsal base, peroneus brevis insertion, right foot. Pain with resisted eversion, right foot. No plantar or lateral pain, right foot. No pain in retromalleolar or distal malleolar area of peroneal tendons, right foot.  No ecchymosis or bruising. No gross deformity.   No images are attached to the encounter.    Results Procedure: Corticosteroid Injection Description: The right foot was prepared with alcohol at the fifth metatarsal base. Insertion of the peroneus brevis and 4 mg of dexamethasone  and 5 mg of Marcaine  were injected into the area. She tolerated the procedure well. Informed Consent: Discussion of the risks, benefits, and alternatives, and consent obtained.  RADIOLOGY Right foot radiograph: enthesopathy at the fifth metatarsal base (12/31/2023)   Assessment:   1. Peroneal tendinitis of right lower extremity   2. Nail dystrophy      Plan:  Patient was evaluated and treated and all questions answered.  Assessment and Plan Assessment & Plan Insertional peroneal tendonitis Chronic pain at the insertion of the peroneus brevis tendon on the fifth metatarsal base with enthesopathy evident on radiograph. No fracture or stress fracture present. Surgical intervention would require tendon detachment and reattachment, which is major surgery. She prefers non-surgical management due to her surgical history. Non-surgical options include corticosteroid injection, physical therapy, and an ankle brace. A low dose steroid injection can reduce inflammation, physical therapy can strengthen and stretch the tendon, and a lace-up ankle brace can reduce pressure. She consented to corticosteroid injection after discussing risks and benefits. - Administer corticosteroid injection with 4 mg dexamethasone  and 5 mg Marcaine  at the fifth metatarsal base. - Provide home physical therapy exercises. - Dispense and apply stabilizing ankle brace.  Thickened toenail Thickened toenail likely due to previous nail removal and damage. Options include regular trimming by a  pedicurist or permanent removal of the toenail and root to prevent regrowth. Permanent removal is a long-term solution but depends on her preference and level  of bother. She is considering options based on current priorities and other health issues. - Consider permanent toenail removal if bothersome.      Return if symptoms worsen or fail to improve.    "

## 2024-01-01 ENCOUNTER — Other Ambulatory Visit: Payer: Self-pay

## 2024-01-01 ENCOUNTER — Telehealth: Payer: Self-pay

## 2024-01-01 DIAGNOSIS — M961 Postlaminectomy syndrome, not elsewhere classified: Secondary | ICD-10-CM

## 2024-01-01 DIAGNOSIS — Z01818 Encounter for other preprocedural examination: Secondary | ICD-10-CM

## 2024-01-01 DIAGNOSIS — G894 Chronic pain syndrome: Secondary | ICD-10-CM

## 2024-01-01 NOTE — Telephone Encounter (Signed)
 Please see below for information in regards to your upcoming surgery:   Planned surgery: 1) thoracic laminectomy for spinal cord stimulator paddle trial. 2) internal pulse generator for spinal cord stimulator   Surgery date: 1) 01/15/24  2) 01/24/24 at [facility] (Medical Mall: [address], [place], [place] [address]) - you will find out your arrival time the business day before your surgery.   Pre-op appointment at [facility] Pre-admit Testing: you will receive a call with a date/time for this appointment. If you are scheduled for an in person appointment, Pre-admit Testing is located on the first floor of the [facility] building, [address], [address]. During this appointment, they will advise you which medications you can take the morning of surgery, and which medications you will need to hold for surgery. Labs (such as blood work, EKG) may be done at your pre-op appointment. You are not required to fast for these labs. Should you need to change your pre-op appointment, please call Pre-admit testing at [phone].     Common restrictions after surgery: No bending, lifting, or twisting ("BLT"). Avoid lifting objects heavier than 10 pounds for the first 6 weeks after surgery. Where possible, avoid household activities that involve lifting, bending, reaching, pushing, or pulling such as laundry, vacuuming, grocery shopping, and childcare. Try to arrange for help from friends and family for these activities while you heal. Do not drive while taking prescription pain medication. Weeks 6 through 12 after surgery: avoid lifting more than 25 pounds.     How to contact us :  If you have any questions/concerns before or after surgery, you can reach us  at [phone], or you can send a mychart message. We can be reached by phone or mychart 8am-4pm, Monday-Friday.  *Please note: Calls after 4pm are forwarded to a third party answering service. Mychart  messages are not routinely monitored during evenings, weekends, and holidays. Please call our office to contact the answering service for urgent concerns during non-business hours.     If you have FMLA/disability paperwork, please drop it off or fax it to [phone], attention Patty.   Appointments/FMLA & disability paperwork: Gerlean Kocher, & Maryann Smalls Registered Nurses/Surgery schedulers: Jenavive Lamboy & Lauren Medical Assistants: Donnajean Fuse Physician Assistants: Ludwig Safer, PA-C, Anastacio Karvonen, PA-C & Lucetta Russel, PA-C Surgeons: Jodeen Munch, MD & Henderson Lock, MD   [facility] PREADMIT TESTING VISIT and SURGERY INFORMATION SHEET   Now that surgery has been scheduled you can anticipate several phone calls from [facility] services. A pharmacy technician will call you to verify your current list of medications taken at home.               The Pre-Service Center will call to verify your insurance information and to give you billing estimates and information.             The Preadmit Testing Office will be calling to schedule a visit to obtain information for the anesthesia team and provide instructions on preparation for surgery.  What can you expect for the Preadmit Testing Visit: Appointments may be scheduled in-person or by telephone.  If a telephone visit is scheduled, you may be asked to come into the office to have lab tests or other studies performed.   This visit will not be completed any greater than 14 days prior to your surgery.  If your surgery has been scheduled for a future date, please do not be alarmed if we have  not contacted you to schedule an appointment more than a month prior to the surgery date.    Please be prepared to provide the following information during this appointment:            -Personal medical history                                               -Medication and allergy list            -Any history of problems with  anesthesia              -Recent lab work or diagnostic studies            -Please notify us  of any needs we should be aware of to provide the best care possible           -You will be provided with instructions on how to prepare for your surgery.    On The Day of Surgery:  You must have a driver to take you home after surgery, you will be asked not to drive for 24 hours following surgery.  Taxi, [employer] and non-medical transport will not be acceptable means of transportation unless you have a responsible individual who will be traveling with you.  Visitors in the surgical area:   2 people will be able to visit you in your room once your preparation for surgery has been completed. During surgery, your visitors will be asked to wait in the Surgery Waiting Area.  It is not a requirement for them to stay, if they prefer to leave and come back.  Your visitor(s) will be given an update once the surgery has been completed.  No visitors are allowed in the initial recovery room to respect patient privacy and safety.  Once you are more awake and transfer to the secondary recovery area, or are transferred to an inpatient room, visitors will again be able to see you.  To respect and protect your privacy: We will ask on the day of surgery who your driver will be and what the contact number for that individual will be. We will ask if it is okay to share information with this individual, or if there is an alternative individual that we, or the surgeon, should contact to provide updates and information. If family or friends come to the surgical information desk requesting information about you, who you have not listed with us , no information will be given.   It may be helpful to designate someone as the main contact who will be responsible for updating your other friends and family.    PREADMIT TESTING OFFICE: [phone] SAME DAY SURGERY: [phone] We look forward to caring for you before and throughout  the process of your surgery.

## 2024-01-05 ENCOUNTER — Encounter (INDEPENDENT_AMBULATORY_CARE_PROVIDER_SITE_OTHER): Payer: Self-pay | Admitting: Vascular Surgery

## 2024-01-07 ENCOUNTER — Other Ambulatory Visit: Admitting: Urology

## 2024-01-09 ENCOUNTER — Other Ambulatory Visit: Payer: Self-pay

## 2024-01-09 ENCOUNTER — Encounter
Admission: RE | Admit: 2024-01-09 | Discharge: 2024-01-09 | Disposition: A | Discharge status: Home / Self Care | Source: Ambulatory Visit | Attending: Neurosurgery | Admitting: Neurosurgery

## 2024-01-09 VITALS — BP 129/81 | HR 70 | Resp 14 | Ht 64.0 in | Wt 265.2 lb

## 2024-01-09 DIAGNOSIS — Z6841 Body Mass Index (BMI) 40.0 and over, adult: Secondary | ICD-10-CM | POA: Insufficient documentation

## 2024-01-09 DIAGNOSIS — I639 Cerebral infarction, unspecified: Secondary | ICD-10-CM | POA: Diagnosis not present

## 2024-01-09 DIAGNOSIS — Z01812 Encounter for preprocedural laboratory examination: Secondary | ICD-10-CM

## 2024-01-09 DIAGNOSIS — Z01810 Encounter for preprocedural cardiovascular examination: Secondary | ICD-10-CM | POA: Diagnosis present

## 2024-01-09 DIAGNOSIS — Z01818 Encounter for other preprocedural examination: Secondary | ICD-10-CM | POA: Insufficient documentation

## 2024-01-09 HISTORY — DX: Morbid (severe) obesity due to excess calories: E66.01

## 2024-01-09 HISTORY — DX: Pure hypercholesterolemia, unspecified: E78.00

## 2024-01-09 HISTORY — DX: Dizziness and giddiness: R42

## 2024-01-09 HISTORY — DX: Multi-system degeneration of the autonomic nervous system: G90.3

## 2024-01-09 HISTORY — DX: Other mechanical complication of implanted electronic neurostimulator of spinal cord electrode (lead), initial encounter: T85.192A

## 2024-01-09 HISTORY — DX: Inappropriate sinus tachycardia, so stated: I47.11

## 2024-01-09 HISTORY — DX: Other chronic pain: G89.29

## 2024-01-09 LAB — BASIC METABOLIC PANEL WITH GFR
Anion gap: 7 (ref 5–15)
BUN: 20 mg/dL (ref 6–20)
CO2: 23 mmol/L (ref 22–32)
Calcium: 8.9 mg/dL (ref 8.9–10.3)
Chloride: 110 mmol/L (ref 98–111)
Creatinine, Ser: 1.06 mg/dL — ABNORMAL HIGH (ref 0.44–1.00)
GFR, Estimated: >60 mL/min (ref >60)
Glucose, Bld: 88 mg/dL (ref 70–99)
Potassium: 3.6 mmol/L (ref 3.5–5.1)
Sodium: 140 mmol/L (ref 135–145)

## 2024-01-09 LAB — CBC
HCT: 37.6 % (ref 36.0–46.0)
Hemoglobin: 12.1 g/dL (ref 12.0–15.0)
MCH: 31.4 pg (ref 26.0–34.0)
MCHC: 32.2 g/dL (ref 30.0–36.0)
MCV: 97.7 fL (ref 80.0–100.0)
Platelets: 276 10*3/uL (ref 150–400)
RBC: 3.85 MIL/uL — ABNORMAL LOW (ref 3.87–5.11)
RDW: 13.3 % (ref 11.5–15.5)
WBC: 4.3 10*3/uL (ref 4.0–10.5)
nRBC: 0 % (ref 0.0–0.2)

## 2024-01-09 LAB — SURGICAL PCR SCREEN
MRSA, PCR: NEGATIVE
Staphylococcus aureus: NEGATIVE

## 2024-01-09 NOTE — Patient Instructions (Addendum)
 Your procedure is scheduled on:01-15-24 Tuesday/01-24-24 Thursday Report to the Registration Desk on the 1st floor of the Medical Mall.Then proceed to the 2nd floor Surgery Desk To find out your arrival time, please call [phone] between 1PM - 3PM on:01-14-24 Monday/01-23-24 Wednesday If your arrival time is 6:00 am, do not arrive before that time as the Medical Mall entrance doors do not open until 6:00 am.  REMEMBER: Instructions that are not followed completely may result in serious medical risk, up to and including death; or upon the discretion of your surgeon and anesthesiologist your surgery may need to be rescheduled.  Do not eat food after midnight the night before surgery.  No gum chewing or hard candies.  You may however, drink CLEAR liquids up to 2 hours before you are scheduled to arrive for your surgery. Do not drink anything within 2 hours of your scheduled arrival time.  Clear liquids include: - water   - apple juice without pulp - gatorade (not RED colors) - black coffee or tea (Do NOT add milk or creamers to the coffee or tea) Do NOT drink anything that is not on this list.  One week prior to surgery Stop ANY OVER THE COUNTER supplements until after surgery (Calcium +D3, Multivitamin, Vitamin B12, Vitamin B6,Thiamine)  Continue taking all of your other prescription medications up until the day of surgery.  ON THE DAY OF SURGERY ONLY TAKE THESE MEDICATIONS WITH SIPS OF WATER : -DULoxetine  (CYMBALTA )   No Alcohol for 24 hours before or after surgery.  No Smoking including e-cigarettes for 24 hours before surgery.  No chewable tobacco products for at least 6 hours before surgery.  No nicotine patches on the day of surgery.  Do not use any "recreational" drugs for at least a week (preferably 2 weeks) before your surgery.  Please be advised that the combination of cocaine and anesthesia may have negative outcomes, up to and including death. If you test positive for  cocaine, your surgery will be cancelled.  On the morning of surgery brush your teeth with toothpaste and water , you may rinse your mouth with mouthwash if you wish. Do not swallow any toothpaste or mouthwash.  Use CHG Soap as directed on instruction sheet.  Do not wear jewelry, make-up, hairpins, clips or nail polish.  For welded (permanent) jewelry: bracelets, anklets, waist bands, etc.  Please have this removed prior to surgery.  If it is not removed, there is a chance that hospital personnel will need to cut it off on the day of surgery.  Do not wear lotions, powders, or perfumes.   Do not shave body hair from the neck down 48 hours before surgery.  Contact lenses, hearing aids and dentures may not be worn into surgery.  Do not bring valuables to the hospital. [facility] is not responsible for any missing/lost belongings or valuables.   Notify your doctor if there is any change in your medical condition (cold, fever, infection).  Wear comfortable clothing (specific to your surgery type) to the hospital.  After surgery, you can help prevent lung complications by doing breathing exercises.  Take deep breaths and cough every 1-2 hours. Your doctor may order a device called an Incentive Spirometer to help you take deep breaths. When coughing or sneezing, hold a pillow firmly against your incision with both hands. This is called "splinting." Doing this helps protect your incision. It also decreases belly discomfort.  If you are being admitted to the hospital overnight, leave your suitcase in the car.  After surgery it may be brought to your room.  In case of increased patient census, it may be necessary for you, the patient, to continue your postoperative care in the Same Day Surgery department.  If you are being discharged the day of surgery, you will not be allowed to drive home. You will need a responsible individual to drive you home and stay with you for 24 hours after surgery.    If you are taking public transportation, you will need to have a responsible individual with you.  Please call the Pre-admissions Testing Dept. at [phone] if you have any questions about these instructions.  Surgery Visitation Policy:  Patients having surgery or a procedure may have two visitors.  Children under the age of 61 must have an adult with them who is not the patient.    Pre-operative 5 CHG Bath Instructions   You can play a key role in reducing the risk of infection after surgery. Your skin needs to be as free of germs as possible. You can reduce the number of germs on your skin by washing with CHG (chlorhexidine  gluconate) soap before surgery. CHG is an antiseptic soap that kills germs and continues to kill germs even after washing.   DO NOT use if you have an allergy to chlorhexidine /CHG or antibacterial soaps. If your skin becomes reddened or irritated, stop using the CHG and notify one of our RNs at [phone].   Please shower with the CHG soap starting 4 days before surgery using the following schedule:     Please keep in mind the following:  DO NOT shave, including legs and underarms, starting the day of your first shower.   You may shave your face at any point before/day of surgery.  Place clean sheets on your bed the day you start using CHG soap. Use a clean washcloth (not used since being washed) for each shower. DO NOT sleep with pets once you start using the CHG.   CHG Shower Instructions:  If you choose to wash your hair and private area, wash first with your normal shampoo/soap.  After you use shampoo/soap, rinse your hair and body thoroughly to remove shampoo/soap residue.  Turn the water  OFF and apply about 3 tablespoons (45 ml) of CHG soap to a CLEAN washcloth.  Apply CHG soap ONLY FROM YOUR NECK DOWN TO YOUR TOES (washing for 3-5 minutes)  DO NOT use CHG soap on face, private areas, open wounds, or sores.  Pay special attention to the area  where your surgery is being performed.  If you are having back surgery, having someone wash your back for you may be helpful. Wait 2 minutes after CHG soap is applied, then you may rinse off the CHG soap.  Pat dry with a clean towel  Put on clean clothes/pajamas   If you choose to wear lotion, please use ONLY the CHG-compatible lotions on the back of this paper.     Additional instructions for the day of surgery: DO NOT APPLY any lotions, deodorants, cologne, or perfumes.   Put on clean/comfortable clothes.  Brush your teeth.  Ask your nurse before applying any prescription medications to the skin.      CHG Compatible Lotions   Aveeno Moisturizing lotion  Cetaphil Moisturizing Cream  Cetaphil Moisturizing Lotion  Clairol Herbal Essence Moisturizing Lotion, Dry Skin  Clairol Herbal Essence Moisturizing Lotion, Extra Dry Skin  Clairol Herbal Essence Moisturizing Lotion, Normal Skin  Curel Age Defying Therapeutic Moisturizing Lotion with Alpha  Hydroxy  Curel Extreme Care Body Lotion  Curel Soothing Hands Moisturizing Hand Lotion  Curel Therapeutic Moisturizing Cream, Fragrance-Free  Curel Therapeutic Moisturizing Lotion, Fragrance-Free  Curel Therapeutic Moisturizing Lotion, Original Formula  Eucerin Daily Replenishing Lotion  Eucerin Dry Skin Therapy Plus Alpha Hydroxy Crme  Eucerin Dry Skin Therapy Plus Alpha Hydroxy Lotion  Eucerin Original Crme  Eucerin Original Lotion  Eucerin Plus Crme Eucerin Plus Lotion  Eucerin TriLipid Replenishing Lotion  Keri Anti-Bacterial Hand Lotion  Keri Deep Conditioning Original Lotion Dry Skin Formula Softly Scented  Keri Deep Conditioning Original Lotion, Fragrance Free Sensitive Skin Formula  Keri Lotion Fast Absorbing Fragrance Free Sensitive Skin Formula  Keri Lotion Fast Absorbing Softly Scented Dry Skin Formula  Keri Original Lotion  Keri Skin Renewal Lotion Keri Silky Smooth Lotion  Keri Silky Smooth Sensitive Skin Lotion  Nivea  Body Creamy Conditioning Oil  Nivea Body Extra Enriched Teacher, adult education Moisturizing Lotion Nivea Crme  Nivea Skin Firming Lotion  NutraDerm 30 Skin Lotion  NutraDerm Skin Lotion  NutraDerm Therapeutic Skin Cream  NutraDerm Therapeutic Skin Lotion  ProShield Protective Hand Cream  Provon moisturizing lotion

## 2024-01-09 NOTE — Patient Instructions (Signed)
    Preparing for Surgery with CHLORHEXIDINE GLUCONATE (CHG) Soap  Chlorhexidine Gluconate (CHG) Soap  o An antiseptic cleaner that kills germs and bonds with the skin to continue killing germs even after washing  o Used for showering the night before surgery and morning of surgery  Before surgery, you can play an important role by reducing the number of germs on your skin.  CHG (Chlorhexidine gluconate) soap is an antiseptic cleanser which kills germs and bonds with the skin to continue killing germs even after washing.  Please do not use if you have an allergy to CHG or antibacterial soaps. If your skin becomes reddened/irritated stop using the CHG.  1. Shower the NIGHT BEFORE SURGERY and the MORNING OF SURGERY with CHG soap.  2. If you choose to wash your hair, wash your hair first as usual with your normal shampoo.  3. After shampooing, rinse your hair and body thoroughly to remove the shampoo.  4. Use CHG as you would any other liquid soap. You can apply CHG directly to the skin and wash gently with a scrungie or a clean washcloth.  5. Apply the CHG soap to your body only from the neck down. Do not use on open wounds or open sores. Avoid contact with your eyes, ears, mouth, and genitals (private parts). Wash face and genitals (private parts) with your normal soap.  6. Wash thoroughly, paying special attention to the area where your surgery will be performed.  7. Thoroughly rinse your body with warm water.  8. Do not shower/wash with your normal soap after using and rinsing off the CHG soap.  9. Pat yourself dry with a clean towel.  10. Wear clean pajamas to bed the night before surgery.  12. Place clean sheets on your bed the night of your first shower and do not sleep with pets.  13. Shower again with the CHG soap on the day of surgery prior to arriving at the hospital.  14. Do not apply any deodorants/lotions/powders.  15. Please wear clean clothes to the  hospital.

## 2024-01-10 LAB — TYPE AND SCREEN
ABO/RH(D): A NEG
Antibody Screen: NEGATIVE

## 2024-01-15 ENCOUNTER — Other Ambulatory Visit: Payer: Self-pay

## 2024-01-15 ENCOUNTER — Encounter
Admission: RE | Disposition: A | Discharge status: Home / Self Care | Payer: Self-pay | Source: Ambulatory Visit | Attending: Neurosurgery

## 2024-01-15 ENCOUNTER — Ambulatory Visit
Admission: RE | Admit: 2024-01-15 | Discharge: 2024-01-15 | Disposition: A | Discharge status: Home / Self Care | Source: Ambulatory Visit | Attending: Neurosurgery | Admitting: Neurosurgery

## 2024-01-15 ENCOUNTER — Ambulatory Visit

## 2024-01-15 ENCOUNTER — Encounter: Payer: Self-pay | Admitting: Neurosurgery

## 2024-01-15 ENCOUNTER — Ambulatory Visit: Payer: Self-pay | Admitting: Urgent Care

## 2024-01-15 ENCOUNTER — Ambulatory Visit: Admitting: Anesthesiology

## 2024-01-15 DIAGNOSIS — J45909 Unspecified asthma, uncomplicated: Secondary | ICD-10-CM | POA: Diagnosis not present

## 2024-01-15 DIAGNOSIS — G894 Chronic pain syndrome: Secondary | ICD-10-CM | POA: Diagnosis present

## 2024-01-15 DIAGNOSIS — M51369 Other intervertebral disc degeneration, lumbar region without mention of lumbar back pain or lower extremity pain: Secondary | ICD-10-CM | POA: Diagnosis present

## 2024-01-15 DIAGNOSIS — Z6841 Body Mass Index (BMI) 40.0 and over, adult: Secondary | ICD-10-CM | POA: Insufficient documentation

## 2024-01-15 DIAGNOSIS — K219 Gastro-esophageal reflux disease without esophagitis: Secondary | ICD-10-CM | POA: Insufficient documentation

## 2024-01-15 DIAGNOSIS — M79609 Pain in unspecified limb: Secondary | ICD-10-CM | POA: Diagnosis present

## 2024-01-15 DIAGNOSIS — M961 Postlaminectomy syndrome, not elsewhere classified: Secondary | ICD-10-CM | POA: Diagnosis not present

## 2024-01-15 DIAGNOSIS — M1990 Unspecified osteoarthritis, unspecified site: Secondary | ICD-10-CM | POA: Insufficient documentation

## 2024-01-15 DIAGNOSIS — Z01818 Encounter for other preprocedural examination: Secondary | ICD-10-CM

## 2024-01-15 DIAGNOSIS — I69351 Hemiplegia and hemiparesis following cerebral infarction affecting right dominant side: Secondary | ICD-10-CM | POA: Diagnosis not present

## 2024-01-15 DIAGNOSIS — G8929 Other chronic pain: Secondary | ICD-10-CM | POA: Diagnosis present

## 2024-01-15 HISTORY — PX: THORACIC LAMINECTOMY FOR SPINAL CORD STIMULATOR: SHX6887

## 2024-01-15 SURGERY — THORACIC LAMINECTOMY FOR SPINAL CORD STIMULATOR
Anesthesia: General | Site: Back

## 2024-01-15 MED ORDER — ONDANSETRON HCL 4 MG/2ML IJ SOLN
INTRAMUSCULAR | Status: DC | PRN
Start: 2024-01-15 — End: 2024-01-15
  Administered 2024-01-15: 4 mg via INTRAVENOUS

## 2024-01-15 MED ORDER — SODIUM CHLORIDE 0.9 % IV SOLN
INTRAVENOUS | Status: DC | PRN
  Administered 2024-01-15: .1 ug/kg/min via INTRAVENOUS

## 2024-01-15 MED ORDER — TRAMADOL HCL 50 MG PO TABS: 50.0000 mg | ORAL_TABLET | Freq: Four times a day (QID) | ORAL | 1 refills | Status: DC | PRN

## 2024-01-15 MED ORDER — LACTATED RINGERS IV SOLN: INTRAVENOUS | Status: DC

## 2024-01-15 MED ORDER — PROPOFOL 10 MG/ML IV BOLUS
INTRAVENOUS | Status: DC | PRN
  Administered 2024-01-15: 200 mg via INTRAVENOUS
  Administered 2024-01-15: 150 ug/kg/min via INTRAVENOUS

## 2024-01-15 MED ORDER — DROPERIDOL 2.5 MG/ML IJ SOLN
0.6250 mg | Freq: Once | INTRAMUSCULAR | Status: AC
  Administered 2024-01-15: 0.625 mg via INTRAVENOUS

## 2024-01-15 MED ORDER — DOCUSATE SODIUM 100 MG PO CAPS: 100.0000 mg | ORAL_CAPSULE | Freq: Two times a day (BID) | ORAL | 0 refills | Status: DC

## 2024-01-15 MED ORDER — CEFAZOLIN IN SODIUM CHLORIDE 3-0.9 GM/100ML-% IV SOLN
3.0000 g | Freq: Once | INTRAVENOUS | Status: DC
  Filled 2024-01-15: qty 100

## 2024-01-15 MED ORDER — SODIUM CHLORIDE 0.9 % IR SOLN
Status: DC | PRN
  Administered 2024-01-15: 500 mL

## 2024-01-15 MED ORDER — SODIUM CHLORIDE (PF) 0.9 % IJ SOLN
INTRAMUSCULAR | Status: AC
Start: 2024-01-15 — End: ?
  Filled 2024-01-15: qty 20

## 2024-01-15 MED ORDER — PROPOFOL 10 MG/ML IV BOLUS
INTRAVENOUS | Status: AC
  Filled 2024-01-15: qty 20

## 2024-01-15 MED ORDER — DIPHENHYDRAMINE HCL 50 MG/ML IJ SOLN
INTRAMUSCULAR | Status: DC | PRN
  Administered 2024-01-15: 12.5 mg via INTRAVENOUS

## 2024-01-15 MED ORDER — SUCCINYLCHOLINE CHLORIDE 200 MG/10ML IV SOSY
PREFILLED_SYRINGE | INTRAVENOUS | Status: DC | PRN
  Administered 2024-01-15: 120 mg via INTRAVENOUS

## 2024-01-15 MED ORDER — MIDAZOLAM HCL 2 MG/2ML IJ SOLN
INTRAMUSCULAR | Status: DC | PRN
  Administered 2024-01-15: 2 mg via INTRAVENOUS

## 2024-01-15 MED ORDER — VANCOMYCIN HCL IN DEXTROSE 1-5 GM/200ML-% IV SOLN
INTRAVENOUS | Status: AC
  Filled 2024-01-15: qty 200

## 2024-01-15 MED ORDER — KETAMINE HCL 50 MG/5ML IJ SOSY
PREFILLED_SYRINGE | INTRAMUSCULAR | Status: DC | PRN
  Administered 2024-01-15: 20 mg via INTRAVENOUS

## 2024-01-15 MED ORDER — REMIFENTANIL HCL 1 MG IV SOLR
INTRAVENOUS | Status: AC
  Filled 2024-01-15: qty 1000

## 2024-01-15 MED ORDER — DEXMEDETOMIDINE HCL IN NACL 80 MCG/20ML IV SOLN
INTRAVENOUS | Status: DC | PRN
  Administered 2024-01-15: 8 ug via INTRAVENOUS

## 2024-01-15 MED ORDER — SODIUM CHLORIDE (PF) 0.9 % IJ SOLN
INTRAMUSCULAR | Status: DC | PRN
  Administered 2024-01-15: 30 mL via INTRAMUSCULAR

## 2024-01-15 MED ORDER — CHLORHEXIDINE GLUCONATE 0.12 % MT SOLN
15.0000 mL | Freq: Once | OROMUCOSAL | Status: AC
  Administered 2024-01-15: 15 mL via OROMUCOSAL

## 2024-01-15 MED ORDER — DEXAMETHASONE SODIUM PHOSPHATE 10 MG/ML IJ SOLN
INTRAMUSCULAR | Status: DC | PRN
  Administered 2024-01-15: 10 mg via INTRAVENOUS

## 2024-01-15 MED ORDER — FENTANYL CITRATE (PF) 100 MCG/2ML IJ SOLN
INTRAMUSCULAR | Status: AC
  Filled 2024-01-15: qty 2

## 2024-01-15 MED ORDER — TRAMADOL HCL 50 MG PO TABS
50.0000 mg | ORAL_TABLET | Freq: Once | ORAL | Status: AC
  Administered 2024-01-15: 50 mg via ORAL

## 2024-01-15 MED ORDER — ONDANSETRON HCL 4 MG/2ML IJ SOLN
4.0000 mg | Freq: Once | INTRAMUSCULAR | Status: AC | PRN
  Administered 2024-01-15: 4 mg via INTRAVENOUS

## 2024-01-15 MED ORDER — BUPIVACAINE HCL (PF) 0.5 % IJ SOLN
INTRAMUSCULAR | Status: AC
Start: 2024-01-15 — End: ?
  Filled 2024-01-15: qty 30

## 2024-01-15 MED ORDER — DROPERIDOL 2.5 MG/ML IJ SOLN
INTRAMUSCULAR | Status: AC
  Filled 2024-01-15: qty 2

## 2024-01-15 MED ORDER — TRAMADOL HCL 50 MG PO TABS
ORAL_TABLET | ORAL | Status: AC
  Filled 2024-01-15: qty 1

## 2024-01-15 MED ORDER — CEPHALEXIN 500 MG PO CAPS: 500.0000 mg | ORAL_CAPSULE | Freq: Three times a day (TID) | ORAL | 0 refills | Status: AC

## 2024-01-15 MED ORDER — BUPIVACAINE LIPOSOME 1.3 % IJ SUSP
INTRAMUSCULAR | Status: AC
  Filled 2024-01-15: qty 20

## 2024-01-15 MED ORDER — PHENYLEPHRINE HCL-NACL 20-0.9 MG/250ML-% IV SOLN
INTRAVENOUS | Status: DC | PRN
  Administered 2024-01-15: 25 ug/min via INTRAVENOUS

## 2024-01-15 MED ORDER — LIDOCAINE HCL (CARDIAC) PF 100 MG/5ML IV SOSY
PREFILLED_SYRINGE | INTRAVENOUS | Status: DC | PRN
  Administered 2024-01-15: 100 mg via INTRAVENOUS

## 2024-01-15 MED ORDER — CHLORHEXIDINE GLUCONATE 0.12 % MT SOLN
OROMUCOSAL | Status: AC
  Filled 2024-01-15: qty 15

## 2024-01-15 MED ORDER — MIDAZOLAM HCL 2 MG/2ML IJ SOLN
INTRAMUSCULAR | Status: AC
Start: 2024-01-15 — End: ?
  Filled 2024-01-15: qty 2

## 2024-01-15 MED ORDER — ALBUTEROL SULFATE HFA 108 (90 BASE) MCG/ACT IN AERS
INHALATION_SPRAY | RESPIRATORY_TRACT | Status: DC | PRN
Start: 2024-01-15 — End: 2024-01-15
  Administered 2024-01-15: 3 via RESPIRATORY_TRACT

## 2024-01-15 MED ORDER — ONDANSETRON HCL 4 MG/2ML IJ SOLN
INTRAMUSCULAR | Status: AC
  Filled 2024-01-15: qty 2

## 2024-01-15 MED ORDER — SURGIFLO WITH THROMBIN (HEMOSTATIC MATRIX KIT) OPTIME
TOPICAL | Status: DC | PRN
  Administered 2024-01-15: 1 via TOPICAL

## 2024-01-15 MED ORDER — SENNA 8.6 MG PO TABS: 1.0000 | ORAL_TABLET | Freq: Every day | ORAL | 0 refills | Status: DC

## 2024-01-15 MED ORDER — CEFAZOLIN SODIUM-DEXTROSE 2-4 GM/100ML-% IV SOLN
INTRAVENOUS | Status: AC
  Filled 2024-01-15: qty 100

## 2024-01-15 MED ORDER — KETAMINE HCL 50 MG/5ML IJ SOSY
PREFILLED_SYRINGE | INTRAMUSCULAR | Status: AC
  Filled 2024-01-15: qty 5

## 2024-01-15 MED ORDER — ORAL CARE MOUTH RINSE: 15.0000 mL | Freq: Once | OROMUCOSAL | Status: AC

## 2024-01-15 MED ORDER — IRRISEPT - 450ML BOTTLE WITH 0.05% CHG IN STERILE WATER, USP 99.95% OPTIME
TOPICAL | Status: DC | PRN
  Administered 2024-01-15: 450 mL

## 2024-01-15 MED ORDER — DEXTROSE 5 % IV SOLN
INTRAVENOUS | Status: DC | PRN
  Administered 2024-01-15: 3 g via INTRAVENOUS

## 2024-01-15 MED ORDER — VANCOMYCIN HCL IN DEXTROSE 1-5 GM/200ML-% IV SOLN
1000.0000 mg | Freq: Once | INTRAVENOUS | Status: AC
  Administered 2024-01-15: 1000 mg via INTRAVENOUS

## 2024-01-15 MED ORDER — FENTANYL CITRATE (PF) 100 MCG/2ML IJ SOLN
INTRAMUSCULAR | Status: DC | PRN
  Administered 2024-01-15 (×2): 50 ug via INTRAVENOUS

## 2024-01-15 MED ORDER — ACETAMINOPHEN 500 MG PO TABS: 500.0000 mg | ORAL_TABLET | Freq: Four times a day (QID) | ORAL | 0 refills | Status: DC | PRN

## 2024-01-15 MED ORDER — ACETAMINOPHEN 10 MG/ML IV SOLN: 1000.0000 mg | Freq: Once | INTRAVENOUS | Status: DC | PRN

## 2024-01-15 MED ORDER — BUPIVACAINE-EPINEPHRINE (PF) 0.5% -1:200000 IJ SOLN
INTRAMUSCULAR | Status: DC | PRN
  Administered 2024-01-15: 10 mL

## 2024-01-15 SURGICAL SUPPLY — 40 items
BUR NEURO DRILL SOFT 3.0X3.8M (BURR) ×1 IMPLANT
DERMABOND ADVANCED .7 DNX12 (GAUZE/BANDAGES/DRESSINGS) ×2 IMPLANT
DRAPE C-ARM XRAY 36X54 (DRAPES) ×1 IMPLANT
DRAPE LAPAROTOMY 100X77 ABD (DRAPES) ×1 IMPLANT
DRSG AQUACEL AG ADV 3.5X 4 (GAUZE/BANDAGES/DRESSINGS) IMPLANT
DRSG AQUACEL AG ADV 3.5X10 (GAUZE/BANDAGES/DRESSINGS) IMPLANT
DRSG OPSITE POSTOP 4X6 (GAUZE/BANDAGES/DRESSINGS) IMPLANT
DRSG OPSITE POSTOP 4X8 (GAUZE/BANDAGES/DRESSINGS) IMPLANT
ELECTRODE REM PT RTRN 9FT ADLT (ELECTROSURGICAL) ×1 IMPLANT
FEE INTRAOP CADWELL SUPPLY NCS (MISCELLANEOUS) ×1 IMPLANT
FEE INTRAOP MONITOR IMPULS NCS (MISCELLANEOUS) ×1 IMPLANT
GLOVE BIOGEL PI IND STRL 7.0 (GLOVE) ×1 IMPLANT
GLOVE BIOGEL PI IND STRL 8 (GLOVE) ×1 IMPLANT
GLOVE SRG 8 PF TXTR STRL LF DI (GLOVE) ×1 IMPLANT
GLOVE SURG SYN 7.0 (GLOVE) ×1 IMPLANT
GLOVE SURG SYN 7.0 PF PI (GLOVE) ×1 IMPLANT
GLOVE SURG SYN 7.5 E (GLOVE) ×1 IMPLANT
GLOVE SURG SYN 7.5 PF PI (GLOVE) ×1 IMPLANT
GOWN SRG LRG LVL 4 IMPRV REINF (GOWNS) ×1 IMPLANT
GOWN SRG XL LVL 3 NONREINFORCE (GOWNS) ×1 IMPLANT
KIT EXT SPINAL CORD STIMULATOR (Orthopedic Implant) IMPLANT
KIT SPINAL PRONEVIEW (KITS) ×1 IMPLANT
LAVAGE JET IRRISEPT WOUND (IRRIGATION / IRRIGATOR) ×1 IMPLANT
MANIFOLD NEPTUNE II (INSTRUMENTS) ×1 IMPLANT
NDL SAFETY ECLIPSE 18X1.5 (NEEDLE) ×1 IMPLANT
NS IRRIG 500ML POUR BTL (IV SOLUTION) ×1 IMPLANT
PACK LAMINECTOMY ARMC (PACKS) ×1 IMPLANT
PAD ARMBOARD POSITIONER FOAM (MISCELLANEOUS) ×1 IMPLANT
PASSER CATH 38CM DISP (INSTRUMENTS) IMPLANT
STAPLER SKIN PROX 35W (STAPLE) IMPLANT
STIMULATOR CORD SURESCAN MRI (Stimulator) IMPLANT
STIMULATOR WIRELESS EXTERNAL (NEUROSURGERY SUPPLIES) IMPLANT
SURGIFLO W/THROMBIN 8M KIT (HEMOSTASIS) ×1 IMPLANT
SUT SILK 2 0SH CR/8 30 (SUTURE) ×1 IMPLANT
SUT STRATA 3-0 15 PS-2 (SUTURE) IMPLANT
SUT VIC AB 0 CT1 18XCR BRD 8 (SUTURE) ×2 IMPLANT
SUT VIC AB 2-0 CT1 18 (SUTURE) ×2 IMPLANT
SUTURE EHLN 3-0 FS-10 30 BLK (SUTURE) IMPLANT
SYR 30ML LL (SYRINGE) ×2 IMPLANT
TRAP FLUID SMOKE EVACUATOR (MISCELLANEOUS) ×1 IMPLANT

## 2024-01-15 NOTE — Anesthesia Postprocedure Evaluation (Signed)
 Anesthesia Post Note  Patient: Vanessa Medina  Procedure(s) Performed: THORACIC LAMINECTOMY FOR SPINAL CORD STIMULATOR (Back)  Patient location during evaluation: PACU Anesthesia Type: General Level of consciousness: awake and alert, oriented and patient cooperative Pain management: pain level controlled Vital Signs Assessment: post-procedure vital signs reviewed and stable Respiratory status: spontaneous breathing, nonlabored ventilation and respiratory function stable Cardiovascular status: blood pressure returned to baseline and stable Postop Assessment: adequate PO intake Anesthetic complications: no   No notable events documented.   Last Vitals:  Vitals:   01/15/24 1248 01/15/24 1302  BP: (!) 172/99 (!) 154/74  Pulse: 64 (!) 54  Resp: 15 16  Temp:    SpO2: 99% 99%    Last Pain:  Vitals:   01/15/24 1302  TempSrc:   PainSc: Asleep                 Dorothey Gate

## 2024-01-15 NOTE — H&P (View-Only) (Signed)
 Referring Physician:  Carroll Clamp, MD [address] [place],  [place] [address]  Primary Physician:  Lyle San, MD  History of Present Illness: 01/15/24  [name] is here today with a chief complaint of back pain and bilateral lower extremity pain.  She has a history of chronic back pain has had multiple lumbar spine surgeries.  She underwent a trial for spinal cord stimulator in 2021 and felt that she had great relief from this.  Unfortunately during the permanent placement she developed extreme pain and was unable to keep the stimulator at that time.  Notably there was a percutaneous placement.  She is here today for further evaluation.  She is been seen by our team and was considered for a possible open spinal cord stimulator.  She has had the psychology evaluation and has had physical therapy.  At this time she would like to go forward with a spinal cord stimulator trial.  We discussed an open paddle trial as she has had a previous complication with a percutaneous trial.  Review of Systems:  A 10 point review of systems is negative, except for the pertinent positives and negatives detailed in the HPI.  Past Medical History: Past Medical History:  Diagnosis Date   Acute nontraumatic kidney injury (HCC)    Anemia    Anxiety    Benign cyst of kidney    Breast cancer (HCC) 01/18/2023   left breast   Cervical cancer (HCC)    Chronic back pain    Chronic hypotension    Chronic kidney disease    Previous now clear   Claustrophobia    Decompression injury of spinal cord    Depression    Dizziness    Failed spinal cord stimulator (HCC)    Family history of adverse reaction to anesthesia     " my mother takes a long time time wake up"   History of dysplastic nevus 01/18/2018   right shoulder, recurrent dysplastic nevus   History of kidney stones    History of shingles    Hypercholesteremia    Inappropriate sinus node tachycardia (HCC)     Migraine    Morbid obesity (HCC)    Multiple allergies    Neurogenic orthostatic hypotension (HCC)    Obesity    Osteoarthritis    left hip   Pneumonia    PONV (postoperative nausea and vomiting)    delayed emergence   Seizure (HCC) 2021   x1-after a back surgery with spinal cord stimulator   Slow to wake up after anesthesia    Stroke (HCC) 08/11/2020   spinal cord stroke, spinal cord puncture during surgery   Wears glasses     Past Surgical History: Past Surgical History:  Procedure Laterality Date   ABDOMINAL HYSTERECTOMY     APPENDECTOMY     BACK SURGERY     BILIOPANCREATIC DIVISION W DUODENAL SWITCH N/A    BREAST BIOPSY Left 12/06/2022   u/s bx,1:00 heart clip, path pending   BREAST BIOPSY Left 12/06/2022   us  bx 2:00 ribbon clip path pending   BREAST BIOPSY Left 12/06/2022   US  LT BREAST BX W LOC DEV EA ADD LESION IMG BX SPEC US  GUIDE 12/06/2022 [facility]   BREAST BIOPSY Left 12/06/2022   US  LT BREAST BX W LOC DEV 1ST LESION IMG BX SPEC US  GUIDE 12/06/2022 [facility]   BREAST RECONSTRUCTION WITH PLACEMENT OF TISSUE EXPANDER AND FLEX HD (ACELLULAR HYDRATED DERMIS) Bilateral 01/18/2023  Procedure: IMMEDIATE LEFT BREAST RECONSTRUCTION WITH PLACEMENT OF TISSUE EXPANDER AND FLEX HD (ACELLULAR HYDRATED DERMIS);  Surgeon: Thornell Flirt, DO;  Location: [facility];  Service: Plastics;  Laterality: Bilateral;   BREAST RECONSTRUCTION WITH PLACEMENT OF TISSUE EXPANDER AND FLEX HD (ACELLULAR HYDRATED DERMIS) Left 03/26/2023   Procedure: BREAST RECONSTRUCTION WITH PLACEMENT OF TISSUE EXPANDER;  Surgeon: Thornell Flirt, DO;  Location: [facility];  Service: Plastics;  Laterality: Left;   CHOLECYSTECTOMY     COLONOSCOPY N/A 06/27/2021   Procedure: COLONOSCOPY;  Surgeon: Quintin Buckle, DO;  Location: [facility];  Service: Gastroenterology;  Laterality: N/A;   COLONOSCOPY N/A 08/27/2023   Procedure: COLONOSCOPY;  Surgeon: Quintin Buckle, DO;  Location: [facility]COPY;  Service: Gastroenterology;  Laterality: N/A;   DIAGNOSTIC LAPAROSCOPY     LOA   ESOPHAGOGASTRODUODENOSCOPY N/A 06/27/2021   Procedure: ESOPHAGOGASTRODUODENOSCOPY (EGD);  Surgeon: Quintin Buckle, DO;  Location: [facility] ENDOSCOPY;  Service: Gastroenterology;  Laterality: N/A;   ESOPHAGOGASTRODUODENOSCOPY (EGD) WITH PROPOFOL  N/A 07/25/2017   Procedure: ESOPHAGOGASTRODUODENOSCOPY (EGD) WITH PROPOFOL ;  Surgeon: Cassie Click, MD;  Location: [facility] ENDOSCOPY;  Service: Endoscopy;  Laterality: N/A;   HERNIA REPAIR     KNEE ARTHROSCOPY Right    LAPAROSCOPIC GASTRIC SLEEVE RESECTION WITH HIATAL HERNIA REPAIR     LUMBAR FUSION  08/29/2022   Revision Lumbar two through five Laminectomy & Fusion with Transforaminal Lumbar interbody fusion   MASTECTOMY W/ SENTINEL NODE BIOPSY Bilateral 01/18/2023   Procedure: MASTECTOMY WITH SENTINEL LYMPH NODE BIOPSY, RNFA to assist;  Surgeon: Alben Alma, MD;  Location: [facility];  Service: General;  Laterality: Bilateral;   REMOVAL OF TISSUE EXPANDER AND PLACEMENT OF IMPLANT Bilateral 06/06/2023   Procedure: REMOVAL OF TISSUE EXPANDER AND PLACEMENT OF IMPLANT;  Surgeon: Thornell Flirt, DO;  Location: [facility];  Service: Plastics;  Laterality: Bilateral;   SPINAL CORD STIMULATOR IMPLANT     TONSILLECTOMY     TOTAL HIP ARTHROPLASTY Left 01/26/2016   Procedure: LEFT TOTAL HIP ARTHROPLASTY ANTERIOR APPROACH;  Surgeon: Wes Hamman, MD;  Location: [facility];  Service: Orthopedics;  Laterality: Left;   TRANSFORAMINAL LUMBAR INTERBODY FUSION (TLIF) WITH PEDICLE SCREW FIXATION 3 LEVEL  08/2019   Amalia Badder, MD L2-L5    Allergies: Allergies as of 01/01/2024 - Review Complete 12/31/2023  Allergen Reaction Noted   Bee venom Swelling 08/15/2022   Iodinated contrast media Anaphylaxis 01/19/2016   Latex Hives and Rash 10/12/2022   Morphine  Other (See Comments) 11/10/2022   Shellfish allergy Anaphylaxis  01/25/2016   Codeine  Nausea And Vomiting 11/10/2022   Meperidine hcl Other (See Comments)    Bee pollen Other (See Comments) 01/25/2016   Oxycodone  Hives and Itching 08/12/2020   Tape Hives 03/15/2023   Pentazocine lactate Nausea And Vomiting    Propoxyphene Nausea Only and Nausea And Vomiting 01/07/2018    Medications:  Current Facility-Administered Medications:    ceFAZolin  (ANCEF ) IVPB 3g/100 mL premix, 3 g, Intravenous, Once, Carroll Clamp, MD   lactated ringers  infusion, , Intravenous, Continuous, Piscitello, Portia Brittle, MD, Last Rate: 10 mL/hr at 01/15/24 0852, New Bag at 01/15/24 0852   vancomycin  (VANCOCIN ) IVPB 1000 mg/200 mL premix, 1,000 mg, Intravenous, Once, Carroll Clamp, MD, Last Rate: 200 mL/hr at 01/15/24 0850, 1,000 mg at 01/15/24 1191  Social History: Social History   Tobacco Use   Smoking status: Never    Passive exposure: Never   Smokeless tobacco: Never  Vaping Use   Vaping  status: Never Used  Substance Use Topics   Alcohol use: Yes    Comment: occasional wine   Drug use: No    Family Medical History: Family History  Problem Relation Age of Onset   Renal Disease Mother    Hypertension Mother    Sudden Cardiac Death Mother    Heart failure Mother    Valvular heart disease Mother    Heart disease Mother    Heart disease Father        Just had stents placed   Stroke Brother    Heart attack Brother 84   Breast cancer Maternal Grandmother    Diabetes Other     Physical Examination: Vitals:   01/15/24 0809  BP: 138/88  Pulse: 77  Resp: 16  Temp: 97.8 F (36.6 C)  SpO2: 98%    General: Patient is in no apparent distress. Attention to examination is appropriate.  Neck:   Supple.  Full range of motion.  Respiratory: Patient is breathing without any difficulty.   NEUROLOGICAL:   Exam:     Awake, alert, oriented to person, place, and time.  Speech is clear and fluent. Fund of knowledge is appropriate.    Cranial Nerves: Pupils  equal round and reactive to light.  Facial tone is symmetric.   ROM of spine: Decreased range of motion of lumbar spine.  Patient is tender to palpation of her thoracic and lumbar spine.   Strength:    Side Iliopsoas Quads Hamstring PF DF EHL R 4 5 4  4-4+ 4-4+ 4-4+ L 3 5 4 5 5 5      Difficult examination of bilateral lower extremities secondary to patient's pain.  Patient also has a tremor in bilateral extremities.   Difficulty obtaining reflexes of her lower extremity. 2+ Patellar reflex on the left, 1+ on the right. 1+achilles reflex on the right, absent on the left.  Unable to obtain hamstring reflex. Clonus is not present.  Toes are down-going.  Patient does have some decreased sensation to bilateral lower extremities Gait is altered and patient ambulates with a cane   Imaging:   MRI Thoracic spine (09/21/23): IMPRESSION: 1. No evidence of high-grade spinal canal or neural foraminal stenosis. 2. Narrowing of the dorsal epidural space at the T8-T11 levels secondary to ligamentum flavum hypertrophy. 3. Minimal T2 hyperintense signal abnormality and contrast enhancement in the left lamina at T6. While this finding is most likely degenerative in nature, a follow-up thoracic spine MRI in 1-2 months is recommended to assess for interval change and exclude the possibility of malignancy     I reviewed her MRI myself, she does have some slight narrowing in her dorsal thoracic levels, however I do feel she could move forward with a thoracic paddle trial, preferably a laminar access around the level of T9   Medical Decision Making/Assessment and Plan: Ms. Jarnagin is a pleasant 53 y.o. female with history of chronic back pain status post previous spinal fusions with failed back syndrome, chronic pain syndrome, who is here for evaluation for spinal cord stimulator.  She previously had a positive spinal cord stimulator trial with percutaneous leads, unfortunately had a complication during  the percutaneous placement and this had to be removed.  Unfortunately she has not been able to have a stimulator in ever since.  At this point she came back for further evaluation.  We discussed possible open paddle trial, she has had physical therapy recently.  Though she has had a previous psychology evaluation, would like to  update this prior to this procedure as she did have a previous complication.  Once we have this we can move forward with planning a paddle trial.  When planned a paddle trial with a T9 access point given the anatomy noted on her MRI.  Should this give her adequate result we consider placing a permanent IPG for spinal cord stimulation.  We discussed risk and benefits of surgery, she did have a previous complication, we did discuss that we would be doing a laminectomy over top of her thoracic spine and this would be at risk for possible spinal cord injury albeit rare.  We also discussed other common risk factors including lead migration, need for further surgery, failure to improve her symptoms.  We did discuss that if she did not get greater than 50% improvement in her symptoms overall that we would have to remove the system and would not go forward with permanent placement.  She would like to go forward with this plan   Carroll Clamp MD/MSCR Neurosurgery

## 2024-01-15 NOTE — Transfer of Care (Signed)
 Immediate Anesthesia Transfer of Care Note  Patient: Vanessa Medina  Procedure(s) Performed: THORACIC LAMINECTOMY FOR SPINAL CORD STIMULATOR (Back)  Patient Location: PACU  Anesthesia Type:General  Level of Consciousness: awake, drowsy, and patient cooperative  Airway & Oxygen Therapy: Patient Spontanous Breathing and Patient connected to face mask oxygen  Post-op Assessment: Report given to RN, Post -op Vital signs reviewed and stable, and Patient moving all extremities X 4  Post vital signs: Reviewed and stable  Last Vitals:  Vitals Value Taken Time  BP 141/55 01/15/24 1221  Temp 36.4 C 01/15/24 1221  Pulse 71 01/15/24 1225  Resp 18 01/15/24 1225  SpO2 100 % 01/15/24 1225  Vitals shown include unfiled device data.  Last Pain:  Vitals:   01/15/24 1221  TempSrc:   PainSc: Asleep         Complications: No notable events documented.

## 2024-01-15 NOTE — Discharge Instructions (Addendum)
 Your surgeon has performed an operation on your back to place a spinal cord stimulator trial to help with pain.Many times, patients feel better immediately after surgery and can "overdo it." Even if you feel well, it is important that you follow these activity guidelines. If you do not let your back heal properly from the surgery, you can increase the chance of hardware complications and/or return of your symptoms. The following are instructions to help in your recovery once you have been discharged from the hospital.   Activity    No bending, lifting, or twisting ("BLT"). Avoid lifting objects heavier than 10 pounds (gallon milk jug).  Where possible, avoid household activities that involve lifting, bending, pushing, or pulling such as laundry, vacuuming, grocery shopping, and childcare. Try to arrange for help from friends and family for these activities while your back heals.  Increase physical activity slowly as tolerated.  Taking short walks is encouraged, but avoid strenuous exercise. Do not jog, run, bicycle, lift weights, or participate in any other exercises unless specifically allowed by your doctor. Avoid prolonged sitting, including car rides.  Talk to your doctor before resuming sexual activity.  You should not drive until cleared by your doctor.  Until released by your doctor, you should not return to work or school.  You should rest at home and let your body heal.   Please refrain from showering until  01/24/24- we do not want your trial to get wet. You may use a wet cloth to wash the rest of your body-again please be careful to not get your back wet.  If you smoke, we strongly recommend that you quit.  Smoking has been proven to interfere with normal healing in your back and will dramatically reduce the success rate of your surgery. Please contact [facility] (800-QUIT-NOW) and use the resources at www.[facility].com for assistance in stopping smoking.  Surgical Incision   Keep  your incision area clean and dry.   Diet/Medications            You may return to your usual diet. Be sure to stay hydrated.  You have been prescribed narcotic pain medications.  This often will cause constipation along with the anesthesia that you underwent.  Please obtain Colace and senna. This has been sent to your local pharmacy, but at times it is not covered by insurance and you may obtain it over the counter..  You should take a stool softener and laxative for the duration of you taking the narcotic pain medications.  We have also sent antibiotics to prevent infection  When to Contact Us   Although your surgery and recovery will likely be uneventful, you may have some residual numbness, aches, and pains in your back and/or legs. This is normal and should improve in the next few weeks.  However, should you experience any of the following, contact us  immediately: New numbness or weakness Pain that is progressively getting worse, and is not relieved by your pain medications or rest Bleeding, redness, swelling, pain, or drainage from surgical incision Chills or flu-like symptoms Fever greater than 101.0 F (38.3 C) Problems with bowel or bladder functions Difficulty breathing or shortness of breath Warmth, tenderness, or swelling in your calf  Contact Information How to contact us :  If you have any questions/concerns before or after surgery, you can reach us  at [phone], or you can send a mychart message. We can be reached by phone or mychart 8am-4pm, Monday-Friday.  *Please note: Calls after 4pm are forwarded to a  third party answering service. Mychart messages are not routinely monitored during evenings, weekends, and holidays. Please call our office to contact the answering service for urgent concerns during non-business hours.

## 2024-01-15 NOTE — Anesthesia Procedure Notes (Signed)
 Procedure Name: Intubation Date/Time: 01/15/2024 10:30 AM  Performed by: Noelia Batman, CRNAPre-anesthesia Checklist: Patient identified, Emergency Drugs available, Suction available and Patient being monitored Patient Re-evaluated:Patient Re-evaluated prior to induction Oxygen Delivery Method: Circle System Utilized Preoxygenation: Pre-oxygenation with 100% oxygen Induction Type: IV induction Ventilation: Mask ventilation without difficulty Laryngoscope Size: McGrath and 4 Tube type: Oral Tube size: 7.0 mm Number of attempts: 1 Airway Equipment and Method: Stylet, Oral airway and LTA kit utilized Placement Confirmation: ETT inserted through vocal cords under direct vision, positive ETCO2 and breath sounds checked- equal and bilateral Secured at: 22 cm Tube secured with: Tape Dental Injury: Teeth and Oropharynx as per pre-operative assessment

## 2024-01-15 NOTE — Anesthesia Preprocedure Evaluation (Addendum)
 Anesthesia Evaluation  Patient identified by MRN, date of birth, ID band Patient awake    Reviewed: Allergy & Precautions, NPO status , Patient's Chart, lab work & pertinent test results  History of Anesthesia Complications (+) PONV, PROLONGED EMERGENCE and history of anesthetic complications  Airway Mallampati: II   Neck ROM: Full    Dental  (+) Missing   Pulmonary asthma    Pulmonary exam normal breath sounds clear to auscultation       Cardiovascular Normal cardiovascular exam Rhythm:Regular Rate:Normal  ECG 01/09/24: normal   Neuro/Psych  Headaches PSYCHIATRIC DISORDERS Anxiety Depression    CVA (2021, residual right-sided weakness, uses walker)    GI/Hepatic ,GERD  ,,  Endo/Other    Class 3 obesity  Renal/GU      Musculoskeletal  (+) Arthritis ,    Abdominal   Peds  Hematology  (+) Blood dyscrasia, anemia Breast CA, cervical CA   Anesthesia Other Findings   Reproductive/Obstetrics                             Anesthesia Physical Anesthesia Plan  ASA: 3  Anesthesia Plan: General   Post-op Pain Management:    Induction: Intravenous  PONV Risk Score and Plan: 4 or greater and Ondansetron , Dexamethasone , Treatment may vary due to age or medical condition and TIVA  Airway Management Planned: Oral ETT  Additional Equipment:   Intra-op Plan:   Post-operative Plan: Extubation in OR  Informed Consent: I have reviewed the patients History and Physical, chart, labs and discussed the procedure including the risks, benefits and alternatives for the proposed anesthesia with the patient or authorized representative who has indicated his/her understanding and acceptance.     Dental advisory given  Plan Discussed with: CRNA  Anesthesia Plan Comments: (Patient consented for risks of anesthesia including but not limited to:  - adverse reactions to medications - damage to eyes, teeth,  lips or other oral mucosa - nerve damage due to positioning  - sore throat or hoarseness - damage to heart, brain, nerves, lungs, other parts of body or loss of life  Informed patient about role of CRNA in peri- and intra-operative care.  Patient voiced understanding.)        Anesthesia Quick Evaluation

## 2024-01-15 NOTE — Progress Notes (Signed)
 Referring Physician:  Carroll Clamp, MD [address] [place],  [place] [address]  Primary Physician:  Lyle San, MD  History of Present Illness: 01/15/24  Vanessa Medina is here today with a chief complaint of back pain and bilateral lower extremity pain.  She has a history of chronic back pain has had multiple lumbar spine surgeries.  She underwent a trial for spinal cord stimulator in 2021 and felt that she had great relief from this.  Unfortunately during the permanent placement she developed extreme pain and was unable to keep the stimulator at that time.  Notably there was a percutaneous placement.  She is here today for further evaluation.  She is been seen by our team and was considered for a possible open spinal cord stimulator.  She has had the psychology evaluation and has had physical therapy.  At this time she would like to go forward with a spinal cord stimulator trial.  We discussed an open paddle trial as she has had a previous complication with a percutaneous trial.  Review of Systems:  A 10 point review of systems is negative, except for the pertinent positives and negatives detailed in the HPI.  Past Medical History: Past Medical History:  Diagnosis Date   Acute nontraumatic kidney injury (HCC)    Anemia    Anxiety    Benign cyst of kidney    Breast cancer (HCC) 01/18/2023   left breast   Cervical cancer (HCC)    Chronic back pain    Chronic hypotension    Chronic kidney disease    Previous now clear   Claustrophobia    Decompression injury of spinal cord    Depression    Dizziness    Failed spinal cord stimulator (HCC)    Family history of adverse reaction to anesthesia     " my mother takes a long time time wake up"   History of dysplastic nevus 01/18/2018   right shoulder, recurrent dysplastic nevus   History of kidney stones    History of shingles    Hypercholesteremia    Inappropriate sinus node tachycardia (HCC)     Migraine    Morbid obesity (HCC)    Multiple allergies    Neurogenic orthostatic hypotension (HCC)    Obesity    Osteoarthritis    left hip   Pneumonia    PONV (postoperative nausea and vomiting)    delayed emergence   Seizure (HCC) 2021   x1-after a back surgery with spinal cord stimulator   Slow to wake up after anesthesia    Stroke (HCC) 08/11/2020   spinal cord stroke, spinal cord puncture during surgery   Wears glasses     Past Surgical History: Past Surgical History:  Procedure Laterality Date   ABDOMINAL HYSTERECTOMY     APPENDECTOMY     BACK SURGERY     BILIOPANCREATIC DIVISION W DUODENAL SWITCH N/A    BREAST BIOPSY Left 12/06/2022   u/s bx,1:00 heart clip, path pending   BREAST BIOPSY Left 12/06/2022   us  bx 2:00 ribbon clip path pending   BREAST BIOPSY Left 12/06/2022   US  LT BREAST BX W LOC DEV EA ADD LESION IMG BX SPEC US  GUIDE 12/06/2022 [facility]   BREAST BIOPSY Left 12/06/2022   US  LT BREAST BX W LOC DEV 1ST LESION IMG BX SPEC US  GUIDE 12/06/2022 [facility]   BREAST RECONSTRUCTION WITH PLACEMENT OF TISSUE EXPANDER AND FLEX HD (ACELLULAR HYDRATED DERMIS) Bilateral 01/18/2023  Procedure: IMMEDIATE LEFT BREAST RECONSTRUCTION WITH PLACEMENT OF TISSUE EXPANDER AND FLEX HD (ACELLULAR HYDRATED DERMIS);  Surgeon: Thornell Flirt, DO;  Location: [facility];  Service: Plastics;  Laterality: Bilateral;   BREAST RECONSTRUCTION WITH PLACEMENT OF TISSUE EXPANDER AND FLEX HD (ACELLULAR HYDRATED DERMIS) Left 03/26/2023   Procedure: BREAST RECONSTRUCTION WITH PLACEMENT OF TISSUE EXPANDER;  Surgeon: Thornell Flirt, DO;  Location: [facility];  Service: Plastics;  Laterality: Left;   CHOLECYSTECTOMY     COLONOSCOPY N/A 06/27/2021   Procedure: COLONOSCOPY;  Surgeon: Quintin Buckle, DO;  Location: [facility];  Service: Gastroenterology;  Laterality: N/A;   COLONOSCOPY N/A 08/27/2023   Procedure: COLONOSCOPY;  Surgeon: Quintin Buckle, DO;  Location: [facility] ENDOSCOPY;  Service: Gastroenterology;  Laterality: N/A;   DIAGNOSTIC LAPAROSCOPY     LOA   ESOPHAGOGASTRODUODENOSCOPY N/A 06/27/2021   Procedure: ESOPHAGOGASTRODUODENOSCOPY (EGD);  Surgeon: Quintin Buckle, DO;  Location: [facility] ENDOSCOPY;  Service: Gastroenterology;  Laterality: N/A;   ESOPHAGOGASTRODUODENOSCOPY (EGD) WITH PROPOFOL  N/A 07/25/2017   Procedure: ESOPHAGOGASTRODUODENOSCOPY (EGD) WITH PROPOFOL ;  Surgeon: Cassie Click, MD;  Location: [facility] ENDOSCOPY;  Service: Endoscopy;  Laterality: N/A;   HERNIA REPAIR     KNEE ARTHROSCOPY Right    LAPAROSCOPIC GASTRIC SLEEVE RESECTION WITH HIATAL HERNIA REPAIR     LUMBAR FUSION  08/29/2022   Revision Lumbar two through five Laminectomy & Fusion with Transforaminal Lumbar interbody fusion   MASTECTOMY W/ SENTINEL NODE BIOPSY Bilateral 01/18/2023   Procedure: MASTECTOMY WITH SENTINEL LYMPH NODE BIOPSY, RNFA to assist;  Surgeon: Alben Alma, MD;  Location: [facility];  Service: General;  Laterality: Bilateral;   REMOVAL OF TISSUE EXPANDER AND PLACEMENT OF IMPLANT Bilateral 06/06/2023   Procedure: REMOVAL OF TISSUE EXPANDER AND PLACEMENT OF IMPLANT;  Surgeon: Thornell Flirt, DO;  Location: [facility];  Service: Plastics;  Laterality: Bilateral;   SPINAL CORD STIMULATOR IMPLANT     TONSILLECTOMY     TOTAL HIP ARTHROPLASTY Left 01/26/2016   Procedure: LEFT TOTAL HIP ARTHROPLASTY ANTERIOR APPROACH;  Surgeon: Wes Hamman, MD;  Location: [facility];  Service: Orthopedics;  Laterality: Left;   TRANSFORAMINAL LUMBAR INTERBODY FUSION (TLIF) WITH PEDICLE SCREW FIXATION 3 LEVEL  08/2019   Amalia Badder, MD L2-L5    Allergies: Allergies as of 01/01/2024 - Review Complete 12/31/2023  Allergen Reaction Noted   Bee venom Swelling 08/15/2022   Iodinated contrast media Anaphylaxis 01/19/2016   Latex Hives and Rash 10/12/2022   Morphine  Other (See Comments) 11/10/2022   Shellfish allergy Anaphylaxis  01/25/2016   Codeine  Nausea And Vomiting 11/10/2022   Meperidine hcl Other (See Comments)    Bee pollen Other (See Comments) 01/25/2016   Oxycodone  Hives and Itching 08/12/2020   Tape Hives 03/15/2023   Pentazocine lactate Nausea And Vomiting    Propoxyphene Nausea Only and Nausea And Vomiting 01/07/2018    Medications:  Current Facility-Administered Medications:    ceFAZolin  (ANCEF ) IVPB 3g/100 mL premix, 3 g, Intravenous, Once, Carroll Clamp, MD   lactated ringers  infusion, , Intravenous, Continuous, Piscitello, Portia Brittle, MD, Last Rate: 10 mL/hr at 01/15/24 0852, New Bag at 01/15/24 0852   vancomycin  (VANCOCIN ) IVPB 1000 mg/200 mL premix, 1,000 mg, Intravenous, Once, Carroll Clamp, MD, Last Rate: 200 mL/hr at 01/15/24 0850, 1,000 mg at 01/15/24 1191  Social History: Social History   Tobacco Use   Smoking status: Never    Passive exposure: Never   Smokeless tobacco: Never  Vaping Use   Vaping  status: Never Used  Substance Use Topics   Alcohol use: Yes    Comment: occasional wine   Drug use: No    Family Medical History: Family History  Problem Relation Age of Onset   Renal Disease Mother    Hypertension Mother    Sudden Cardiac Death Mother    Heart failure Mother    Valvular heart disease Mother    Heart disease Mother    Heart disease Father        Just had stents placed   Stroke Brother    Heart attack Brother 84   Breast cancer Maternal Grandmother    Diabetes Other     Physical Examination: Vitals:   01/15/24 0809  BP: 138/88  Pulse: 77  Resp: 16  Temp: 97.8 F (36.6 C)  SpO2: 98%    General: Patient is in no apparent distress. Attention to examination is appropriate.  Neck:   Supple.  Full range of motion.  Respiratory: Patient is breathing without any difficulty.   NEUROLOGICAL:   Exam:     Awake, alert, oriented to person, place, and time.  Speech is clear and fluent. Fund of knowledge is appropriate.    Cranial Nerves: Pupils  equal round and reactive to light.  Facial tone is symmetric.   ROM of spine: Decreased range of motion of lumbar spine.  Patient is tender to palpation of her thoracic and lumbar spine.   Strength:    Side Iliopsoas Quads Hamstring PF DF EHL R 4 5 4  4-4+ 4-4+ 4-4+ L 3 5 4 5 5 5      Difficult examination of bilateral lower extremities secondary to patient's pain.  Patient also has a tremor in bilateral extremities.   Difficulty obtaining reflexes of her lower extremity. 2+ Patellar reflex on the left, 1+ on the right. 1+achilles reflex on the right, absent on the left.  Unable to obtain hamstring reflex. Clonus is not present.  Toes are down-going.  Patient does have some decreased sensation to bilateral lower extremities Gait is altered and patient ambulates with a cane   Imaging:   MRI Thoracic spine (09/21/23): IMPRESSION: 1. No evidence of high-grade spinal canal or neural foraminal stenosis. 2. Narrowing of the dorsal epidural space at the T8-T11 levels secondary to ligamentum flavum hypertrophy. 3. Minimal T2 hyperintense signal abnormality and contrast enhancement in the left lamina at T6. While this finding is most likely degenerative in nature, a follow-up thoracic spine MRI in 1-2 months is recommended to assess for interval change and exclude the possibility of malignancy     I reviewed her MRI myself, she does have some slight narrowing in her dorsal thoracic levels, however I do feel she could move forward with a thoracic paddle trial, preferably a laminar access around the level of T9   Medical Decision Making/Assessment and Plan: Ms. Jarnagin is a pleasant 53 y.o. female with history of chronic back pain status post previous spinal fusions with failed back syndrome, chronic pain syndrome, who is here for evaluation for spinal cord stimulator.  She previously had a positive spinal cord stimulator trial with percutaneous leads, unfortunately had a complication during  the percutaneous placement and this had to be removed.  Unfortunately she has not been able to have a stimulator in ever since.  At this point she came back for further evaluation.  We discussed possible open paddle trial, she has had physical therapy recently.  Though she has had a previous psychology evaluation, would like to  update this prior to this procedure as she did have a previous complication.  Once we have this we can move forward with planning a paddle trial.  When planned a paddle trial with a T9 access point given the anatomy noted on her MRI.  Should this give her adequate result we consider placing a permanent IPG for spinal cord stimulation.  We discussed risk and benefits of surgery, she did have a previous complication, we did discuss that we would be doing a laminectomy over top of her thoracic spine and this would be at risk for possible spinal cord injury albeit rare.  We also discussed other common risk factors including lead migration, need for further surgery, failure to improve her symptoms.  We did discuss that if she did not get greater than 50% improvement in her symptoms overall that we would have to remove the system and would not go forward with permanent placement.  She would like to go forward with this plan   Carroll Clamp MD/MSCR Neurosurgery

## 2024-01-15 NOTE — Op Note (Signed)
 Indications: the patient is a 53 yo female who was diagnosed with failed back surgical syndrome, chronic pain syndrome.  She has exhausted her conservative management, worked with physical therapy, had a previously aborted percutaneous trial, is here today for her paddle trial.   Findings: successful placement of a thoracic spinal cord stimulator.    Preoperative Diagnosis: M96.1 Failed back surgical syndrome G89.4 Chronic pain syndrome  Postoperative Diagnosis: M96.1 Failed back surgical syndromeG89.4 Chronic pain syndrome     EBL: 20 ml IVF: see anesthesia record Drains: none Disposition: Extubated and Stable to PACU Complications: none   No foley catheter was placed.     Preoperative Note:    Risks of surgery discussed in clinic.   Operative Note:      The patient was then brought from the preoperative center with intravenous access established.  The patient underwent general anesthesia and endotracheal tube intubation, then was rotated on the [facility] table where all pressure points were appropriately padded.  An incision was marked with flouroscopy at T9/10, and on the left flank with a tunneling exit site on the right. The skin was then thoroughly cleansed.  Perioperative antibiotic prophylaxis was administered.  Sterile prep and drapes were then applied and a timeout was then observed.     Once this was complete an incision was opened with the use of a #10 blade knife in the midline at the thoracic incision.  The paraspinus muscled were subperiosteally dissected until the laminae of T9-10 were visualized. Flouroscopy was used to confirm the level. A self-retaining retractor was placed.   The rongeur was used to remove the spinous process of T9.  The drill was used to thin the bone until the ligamentum flavum was visualized.  The ligamentum was then removed and the dura visualized. This was widened until placement of the paddle lead was possible.     The lead was then advanced to  the T7/8 disc space at the top of the lead.  The lead was secured with a 2-0 silk suture.     The incision on the flank was then opened and a pocket formed until it was large enough for the pulse generator.  The tunneler was used to connect between the pocket and the incision.  The lead was inserted into the tunneler and tunneled to the buttock.  We performed intraoperative neuromonitoring to verify laterality of the paddle, the left-sided leads stimulated the left side of the patient to the right sided leads stimulated the right side of the patient.  We then passed temporary extensions to the contralateral side over on the right subcutaneously.  We connected the leads into these extensions and placed the ends into the temporary stimulator.  Both sites were irrigated.  The wounds were closed in layers with 0 and 2-0 vicryl.  The skin was approximated with monocryl and glue. A sterile dressing was applied.   Monitoring was stable throughout.   Patient was then rotated back to the preoperative bed awakened from anesthesia and taken to recovery. All counts are correct in this case.   I performed the entire procedure with the assistance of Ludwig Safer, [place] as an Designer, television/film set. An assistant was required for this procedure due to the complexity.  The assistant provided assistance in tissue manipulation and suction, and was required for the successful and safe performance of the procedure. I performed the critical portions of the procedure.  Implant Name Type Inv. Item Serial No. Manufacturer Lot No. LRB No. Used Action  STIMULATOR CORD SURESCAN MRI - SNA Stimulator STIMULATOR CORD SURESCAN MRI NA [employer] NEUROMOD PAIN MGMT [id number] N/A 1 Implanted  KIT EXT SPINAL CORD STIMULATOR - [id number] V Orthopedic Implant KIT EXT SPINAL CORD STIMULATOR [id number] V [employer] NEUROMOD PAIN MGMT [id number] N/A 1 Implanted  KIT EXT SPINAL CORD STIMULATOR - [id number] V Orthopedic Implant KIT EXT SPINAL CORD  STIMULATOR [id number] V [employer] NEUROMOD PAIN MGMT [id number] N/A 1 Implanted    Carroll Clamp, MD

## 2024-01-15 NOTE — Interval H&P Note (Signed)
 History and Physical Interval Note:  01/15/2024 9:43 AM  Vanessa Medina  has presented today for surgery, with the diagnosis of M96.1 Failed back surgical syndrome G89.4 Chronic pain syndrome.  The various methods of treatment have been discussed with the patient and family. After consideration of risks, benefits and other options for treatment, the patient has consented to  Procedure(s) with comments: THORACIC LAMINECTOMY FOR SPINAL CORD STIMULATOR (N/A) - THORACIC LAMINECTOMY FOR SPINAL CORD STIMULATOR PADDLE TRIAL as a surgical intervention.  The patient's history has been reviewed, patient examined, no change in status, stable for surgery.  I have reviewed the patient's chart and labs.  Questions were answered to the patient's satisfaction.    Heart and lungs clear  Carroll Clamp

## 2024-01-16 ENCOUNTER — Encounter: Payer: Self-pay | Admitting: Neurosurgery

## 2024-01-17 ENCOUNTER — Telehealth: Payer: Self-pay

## 2024-01-18 ENCOUNTER — Other Ambulatory Visit

## 2024-01-18 ENCOUNTER — Ambulatory Visit

## 2024-01-18 ENCOUNTER — Other Ambulatory Visit: Payer: Self-pay | Admitting: Physician Assistant

## 2024-01-18 DIAGNOSIS — T148XXA Other injury of unspecified body region, initial encounter: Secondary | ICD-10-CM

## 2024-01-18 MED ORDER — HYDROXYZINE HCL 10 MG PO TABS
10.0000 mg | ORAL_TABLET | Freq: Three times a day (TID) | ORAL | 0 refills | Status: AC | PRN
Start: 2024-01-18 — End: ?

## 2024-01-18 NOTE — Telephone Encounter (Signed)
 Discussed with patient during nurse visit. Will call her on Monday for updates.

## 2024-01-18 NOTE — Progress Notes (Signed)
 Patient presents to clinic today for wound check due to concerns of bleeding  from the site of her battery placement for Spinal Cord Stimulator Trial with Dr. Felipe Horton. Patient called this morning to report bleeding from site and send the pictures attached via MyChart, as seen as attached.      Patient states that the bleeding started a few days ago but has increased over the last day.   On assessment the dressing is soiled with serosanguinous drainage at lead exit site. Wound was assessed by Deklin Bieler Ponds PA-[place]. Dressing was changed by Vanita Cannell Ponds PA-C with my assistance.   We redressed the surgical wound with split gauze, ABD pad, and priform dressings. There was no  purulent drainage, redness or swelling around the site.   She complains of itching due to dressing used however the patient has allergies to alternative dressings. Brooke PA-C sent in a script of Atarax to help with itching.   Functional Status Assessment   During this visit, I also assessed her symptoms after the stimulator trial.   She indicates that her current percentage of relief is  45%. She notes decreased leg pain, increased ability to lift legs bilaterally and ability to sleep on her left side which was hard to do before implantation of trial Spinal Cord Stimulator. She has been unable to participate in ADLs or home chores due to post-operative limitations.   Patient is currently taking her prescribed Tylenol  500mg  three times daily and Mobic  7.5mg  at night to combat post surgical pain.    Patient was advised to call Monday morning if she continues to experience drainage. We will also call her on Monday for updates on functional status over the weekend.    Barbra Ley Ariyanna Oien RN-BSN [facility]  Neurosurgery at [facility]

## 2024-01-18 NOTE — Progress Notes (Signed)
 Saw patient in clinic today.  She had a spinal cord stimulator trial placed on 01/15/2024.  She came in today for leaking from her incision site of her right low back where her leads are.  Drainage was serosanguineous.  No surrounding erythema or puslike drainage.  No frank blood.  Gauze and dressing removed.  New gauze placed as well as reinforcements with ABD pads and Primapore.  She was complaining of itching at her incision site.  Atarax was sent to her local pharmacy.  Discussed this at length.  Red flag symptoms reviewed with the patient.  Advised to contact the office if she has any additional concerns due to her drainage.

## 2024-01-21 ENCOUNTER — Ambulatory Visit

## 2024-01-21 ENCOUNTER — Telehealth: Payer: Self-pay

## 2024-01-21 NOTE — Telephone Encounter (Addendum)
 I contacted Vanessa Medina. She reports the bandage from Friday is saturated again. Her dad added more tape on Saturday because it came loose on the side. Per discussion with Ludwig Safer, PA-C, I have scheduled her a nurse visit for 1pm to change her dressing.  She reports her legs feel "great" and 65% relief. She is unable to rate her back pain, but she is working with the rep regarding settings. She is able to get up and down out of her chairs better (before she wasn't able to). Walking up and down the stairs is easier. She is able to stand longer (twice as long as she could before). She has done some dishes. She also told Lauren, RN on Friday that she is able to sleep on her left side, which she could not do before. She is limited in her activities due to the post-op restrictions given by us , but feels the stimulator has helped significantly and would like to move forward with the battery placement as scheduled on 01/24/24.

## 2024-01-21 NOTE — Progress Notes (Signed)
 Patient presents to the office today for dressing change around her lead exit site for her Spinal Cord Stimulator trial.   She had a dressing change on 01/18/2024 however, due to leakage of serosanguinous fluid at the lead exit site, her dressings were saturated within 2 days.   Patient's dressing was removed and site was inspected. Compared to Friday, the patient's skin is intact except scant areas of irritation. Patient was given Atarax  by Estil Heman which she states was helping the itchiness around the skin.   Prior to dressing change, skin was cleaned with rubbing alcohol to remove old glue from the site.   Brooke PA-C inspected the site with me. It was agreed that a similar approach would be taken to dressing the site of drainage. We redressed the surgical wound with split gauze, and 5 priform dressings. On assessment the dressing is soiled with serosanguinous drainage at lead exit site. There was no purulent drainage, redness or swelling around the site.   Patient will call if she needs another dressing change prior to planned procedure on 01/24/2024.    Barbra Ley Bricen Victory RN-BSN [facility]  Neurosurgery at [facility] RN [phone]

## 2024-01-21 NOTE — Telephone Encounter (Signed)
 Pt scheduled for nurse visit for 1pm for bandage change

## 2024-01-22 ENCOUNTER — Encounter (INDEPENDENT_AMBULATORY_CARE_PROVIDER_SITE_OTHER): Payer: Self-pay

## 2024-01-23 MED ORDER — ORAL CARE MOUTH RINSE: 15.0000 mL | Freq: Once | OROMUCOSAL | Status: AC

## 2024-01-23 MED ORDER — LACTATED RINGERS IV SOLN
INTRAVENOUS | Status: DC
Start: 2024-01-23 — End: 2024-01-24

## 2024-01-23 MED ORDER — CEFAZOLIN IN SODIUM CHLORIDE 2-0.9 GM/100ML-% IV SOLN
3.0000 g | Freq: Once | INTRAVENOUS | Status: DC
  Filled 2024-01-23: qty 200

## 2024-01-23 MED ORDER — CEFAZOLIN SODIUM-DEXTROSE 3-4 GM/150ML-% IV SOLN
3.0000 g | INTRAVENOUS | Status: AC
  Administered 2024-01-24: 3 g via INTRAVENOUS
  Filled 2024-01-23: qty 150

## 2024-01-23 MED ORDER — CHLORHEXIDINE GLUCONATE 0.12 % MT SOLN
15.0000 mL | Freq: Once | OROMUCOSAL | Status: AC
  Administered 2024-01-24: 15 mL via OROMUCOSAL

## 2024-01-23 MED ORDER — VANCOMYCIN HCL IN DEXTROSE 1-5 GM/200ML-% IV SOLN
1000.0000 mg | Freq: Once | INTRAVENOUS | Status: AC
Start: 2024-01-23 — End: 2024-01-24
  Administered 2024-01-24: 1000 mg via INTRAVENOUS

## 2024-01-24 ENCOUNTER — Ambulatory Visit
Admission: RE | Admit: 2024-01-24 | Discharge: 2024-01-24 | Disposition: A | Discharge status: Home / Self Care | Attending: Neurosurgery | Admitting: Neurosurgery

## 2024-01-24 ENCOUNTER — Encounter
Admission: RE | Disposition: A | Discharge status: Home / Self Care | Payer: Self-pay | Source: Home / Self Care | Attending: Neurosurgery

## 2024-01-24 ENCOUNTER — Ambulatory Visit: Admitting: Anesthesiology

## 2024-01-24 ENCOUNTER — Other Ambulatory Visit: Payer: Self-pay

## 2024-01-24 ENCOUNTER — Encounter: Payer: Self-pay | Admitting: Neurosurgery

## 2024-01-24 DIAGNOSIS — Z9689 Presence of other specified functional implants: Secondary | ICD-10-CM

## 2024-01-24 DIAGNOSIS — M961 Postlaminectomy syndrome, not elsewhere classified: Secondary | ICD-10-CM | POA: Diagnosis not present

## 2024-01-24 DIAGNOSIS — G894 Chronic pain syndrome: Secondary | ICD-10-CM | POA: Diagnosis present

## 2024-01-24 DIAGNOSIS — I6930 Unspecified sequelae of cerebral infarction: Secondary | ICD-10-CM | POA: Insufficient documentation

## 2024-01-24 DIAGNOSIS — Z01818 Encounter for other preprocedural examination: Secondary | ICD-10-CM

## 2024-01-24 DIAGNOSIS — M549 Dorsalgia, unspecified: Secondary | ICD-10-CM | POA: Diagnosis present

## 2024-01-24 HISTORY — PX: PULSE GENERATOR IMPLANT: SHX5370

## 2024-01-24 HISTORY — DX: Presence of other specified functional implants: Z96.89

## 2024-01-24 LAB — TYPE AND SCREEN
ABO/RH(D): A NEG
Antibody Screen: NEGATIVE

## 2024-01-24 SURGERY — UNILATERAL PULSE GENERATOR IMPLANT
Anesthesia: General

## 2024-01-24 MED ORDER — FENTANYL CITRATE (PF) 100 MCG/2ML IJ SOLN
INTRAMUSCULAR | Status: AC
Start: 2024-01-24 — End: ?
  Filled 2024-01-24: qty 2

## 2024-01-24 MED ORDER — BUPIVACAINE LIPOSOME 1.3 % IJ SUSP
INTRAMUSCULAR | Status: AC
  Filled 2024-01-24: qty 10

## 2024-01-24 MED ORDER — BUPIVACAINE-EPINEPHRINE (PF) 0.5% -1:200000 IJ SOLN
INTRAMUSCULAR | Status: AC
  Filled 2024-01-24: qty 20

## 2024-01-24 MED ORDER — IRRISEPT - 450ML BOTTLE WITH 0.05% CHG IN STERILE WATER, USP 99.95% OPTIME
TOPICAL | Status: DC | PRN
  Administered 2024-01-24: 450 mL

## 2024-01-24 MED ORDER — ROCURONIUM BROMIDE 100 MG/10ML IV SOLN
INTRAVENOUS | Status: DC | PRN
  Administered 2024-01-24: 50 mg via INTRAVENOUS

## 2024-01-24 MED ORDER — ACETAMINOPHEN 10 MG/ML IV SOLN
INTRAVENOUS | Status: AC
  Filled 2024-01-24: qty 100

## 2024-01-24 MED ORDER — DEXAMETHASONE SODIUM PHOSPHATE 10 MG/ML IJ SOLN
INTRAMUSCULAR | Status: DC | PRN
  Administered 2024-01-24: 10 mg via INTRAVENOUS

## 2024-01-24 MED ORDER — PROPOFOL 1000 MG/100ML IV EMUL
INTRAVENOUS | Status: AC
  Filled 2024-01-24: qty 100

## 2024-01-24 MED ORDER — LIDOCAINE HCL (CARDIAC) PF 100 MG/5ML IV SOSY
PREFILLED_SYRINGE | INTRAVENOUS | Status: DC | PRN
  Administered 2024-01-24: 50 mg via INTRAVENOUS

## 2024-01-24 MED ORDER — TRAMADOL HCL 50 MG PO TABS: 50.0000 mg | ORAL_TABLET | Freq: Four times a day (QID) | ORAL | 0 refills | Status: AC | PRN

## 2024-01-24 MED ORDER — MIDAZOLAM HCL 2 MG/2ML IJ SOLN
INTRAMUSCULAR | Status: AC
  Filled 2024-01-24: qty 2

## 2024-01-24 MED ORDER — FENTANYL CITRATE (PF) 100 MCG/2ML IJ SOLN
INTRAMUSCULAR | Status: DC | PRN
  Administered 2024-01-24: 100 ug via INTRAVENOUS

## 2024-01-24 MED ORDER — ONDANSETRON HCL 4 MG/2ML IJ SOLN
INTRAMUSCULAR | Status: DC | PRN
  Administered 2024-01-24: 4 mg via INTRAVENOUS

## 2024-01-24 MED ORDER — FENTANYL CITRATE (PF) 100 MCG/2ML IJ SOLN
25.0000 ug | INTRAMUSCULAR | Status: DC | PRN
  Administered 2024-01-24 (×2): 25 ug via INTRAVENOUS

## 2024-01-24 MED ORDER — SODIUM CHLORIDE (PF) 0.9 % IJ SOLN
INTRAMUSCULAR | Status: AC
  Filled 2024-01-24: qty 10

## 2024-01-24 MED ORDER — BUPIVACAINE HCL (PF) 0.5 % IJ SOLN
INTRAMUSCULAR | Status: AC
  Filled 2024-01-24: qty 30

## 2024-01-24 MED ORDER — PROPOFOL 10 MG/ML IV BOLUS
INTRAVENOUS | Status: DC | PRN
  Administered 2024-01-24: 150 mg via INTRAVENOUS

## 2024-01-24 MED ORDER — PHENYLEPHRINE 80 MCG/ML (10ML) SYRINGE FOR IV PUSH (FOR BLOOD PRESSURE SUPPORT)
PREFILLED_SYRINGE | INTRAVENOUS | Status: DC | PRN
  Administered 2024-01-24: 80 ug via INTRAVENOUS

## 2024-01-24 MED ORDER — VANCOMYCIN HCL IN DEXTROSE 1-5 GM/200ML-% IV SOLN
INTRAVENOUS | Status: AC
  Filled 2024-01-24: qty 200

## 2024-01-24 MED ORDER — FENTANYL CITRATE (PF) 100 MCG/2ML IJ SOLN
INTRAMUSCULAR | Status: AC
  Filled 2024-01-24: qty 2

## 2024-01-24 MED ORDER — CHLORHEXIDINE GLUCONATE 0.12 % MT SOLN
OROMUCOSAL | Status: AC
  Filled 2024-01-24: qty 15

## 2024-01-24 MED ORDER — BUPIVACAINE-EPINEPHRINE (PF) 0.5% -1:200000 IJ SOLN
INTRAMUSCULAR | Status: DC | PRN
  Administered 2024-01-24: 10 mL

## 2024-01-24 MED ORDER — SUGAMMADEX SODIUM 200 MG/2ML IV SOLN
INTRAVENOUS | Status: DC | PRN
  Administered 2024-01-24: 200 mg via INTRAVENOUS

## 2024-01-24 SURGICAL SUPPLY — 29 items
BELT PT INTERSTIM MICRO SYSTEM (MISCELLANEOUS) IMPLANT
BRUSH SCRUB EZ 4% CHG (MISCELLANEOUS) ×1 IMPLANT
CONTROLLER HANDSET COMM KIT (NEUROSURGERY SUPPLIES) IMPLANT
DERMABOND ADVANCED .7 DNX12 (GAUZE/BANDAGES/DRESSINGS) ×2 IMPLANT
DRAPE LAPAROTOMY 100X77 ABD (DRAPES) ×1 IMPLANT
DRSG AQUACEL AG ADV 3.5X10 (GAUZE/BANDAGES/DRESSINGS) IMPLANT
ELECTRODE REM PT RTRN 9FT ADLT (ELECTROSURGICAL) ×1 IMPLANT
ENVELOPE ABSORB ANTIBACTERIAL (Mesh General) ×1 IMPLANT
GLOVE BIOGEL PI IND STRL 8 (GLOVE) ×1 IMPLANT
GLOVE SURG SYN 7.5 E (GLOVE) ×2 IMPLANT
GLOVE SURG SYN 7.5 PF PI (GLOVE) ×1 IMPLANT
GOWN SRG LRG LVL 4 IMPRV REINF (GOWNS) ×1 IMPLANT
GOWN SRG XL LVL 3 NONREINFORCE (GOWNS) ×1 IMPLANT
KIT SPINAL PRONEVIEW (KITS) ×1 IMPLANT
LAVAGE JET IRRISEPT WOUND (IRRIGATION / IRRIGATOR) ×1 IMPLANT
MANIFOLD NEPTUNE II (INSTRUMENTS) ×1 IMPLANT
NDL SAFETY ECLIPSE 18X1.5 (NEEDLE) ×1 IMPLANT
NEUROSTIM INCEPTIV (Neuro Prosthesis/Implant) IMPLANT
NS IRRIG 500ML POUR BTL (IV SOLUTION) ×1 IMPLANT
PACK LAMINECTOMY ARMC (PACKS) ×1 IMPLANT
POUCH TYRX ANTIBAC NEURO MED (Mesh General) IMPLANT
PROGRAMMER AND COMM CASE (NEUROSURGERY SUPPLIES) IMPLANT
RECHARGER SYSTEM (NEUROSURGERY SUPPLIES) IMPLANT
SUT STRATA 3-0 15 PS-2 (SUTURE) IMPLANT
SUT VIC AB 0 CT1 18XCR BRD 8 (SUTURE) ×2 IMPLANT
SUT VIC AB 2-0 CT1 18 (SUTURE) ×2 IMPLANT
SUTURE MNCRL 4-0 27XMF (SUTURE) IMPLANT
SYR 30ML LL (SYRINGE) ×2 IMPLANT
TRAP FLUID SMOKE EVACUATOR (MISCELLANEOUS) ×1 IMPLANT

## 2024-01-24 NOTE — Discharge Instructions (Addendum)
 Your surgeon has performed an procedure implanting a Battery for Spinal Cord Stimulator Alone. This involved making an incision in the Flank.   The following are instructions to help in your recovery once you have been discharged from the hospital. Even if you feel well, it is important that you follow these activity guidelines. If you do not let your body heal properly from the surgery, you can increase the chance of return of your symptoms and other complications.  You will be working with your spinal cord stimulator team for programming and titrations.  Most patients require multiple titrations before we get the settings just right.  We appreciate your patience and working with [name] to optimize your care.  *NSAIDs are okay to take after surgery  Activity    No bending, lifting, or twisting ("BLT"). Avoid lifting objects heavier than 10 pounds (gallon milk jug).  Where possible, avoid household activities that involve lifting, bending, reaching, pushing, or pulling such as laundry, vacuuming, grocery shopping, and childcare. Try to arrange for help from friends and family for these activities while your back heals.  Increase physical activity slowly as tolerated.  Taking short walks is encouraged, but avoid strenuous exercise. Do not jog, run, bicycle, lift weights, or participate in any other exercises unless specifically allowed by your doctor.  Talk to your doctor before resuming sexual activity.  You should not drive until cleared by your doctor.  Until released by your doctor, you should not return to work or school.  You should rest at home and let your body heal.   You may shower three days after your surgery.  After showering, lightly dab your incision dry. Do not take a tub bath or go swimming until approved by your doctor at your follow-up appointment.  If you smoke, we strongly recommend that you quit.  Smoking has been proven to interfere with normal bone healing and will dramatically  reduce the success rate of your surgery. Please contact [facility] (800-QUIT-NOW) and use the resources at www.[facility].com for assistance in stopping smoking.  Surgical Incision   If you have a dressing on your incision, you may remove it 3 days after your surgery. Keep your incision area clean and dry.  Your stitches are under your skin and dissolvable.  They are covered by surgical glue.  The glue should begin to peel away within about a week.  Diet           You may return to your usual diet. Be sure to stay hydrated.  When to Contact us  You may experience pain in your neck and/or pain between your shoulder blades, or at your back depending on your incision sites.. This is normal and should improve in the next few weeks with the help of pain medication, muscle relaxers, and rest. Some patients report that a warm compress on the back of the neck or between the shoulder blades helps.  However, should you experience any of the following, contact us immediately: New numbness or weakness Pain that is progressively getting worse, and is not relieved by your pain medication, muscle relaxers, rest, and warm compresses Bleeding, redness, swelling, pain, or drainage from surgical incision Chills or flu-like symptoms Fever greater than 101.0 F (38.3 C) Inability to eat, drink fluids, or take medications Problems with bowel or bladder functions Difficulty breathing or shortness of breath Warmth, tenderness, or swelling in your calf Contact Information How to contact us:  If you have any questions/concerns before or after surgery, you  can reach [name] at [phone], or you can send a mychart message. We can be reached by phone or mychart 8am-4pm, Monday-Friday.  *Please note: Calls after 4pm are forwarded to a third party answering service. Mychart messages are not routinely monitored during evenings, weekends, and holidays. Please call our office to contact the answering service for urgent  concerns during non-business hours.

## 2024-01-24 NOTE — Interval H&P Note (Signed)
 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  The various methods of treatment have been discussed with the patient and family. After consideration of risks, benefits and other options for treatment, the patient has consented to  Procedure(s) with comments: UNILATERAL PULSE GENERATOR IMPLANT (N/A) - INTERNAL PULSE GENERATOR PLACEMENT FOR SPINAL CORD STIMULATOR as a surgical intervention.  The patient's history has been reviewed, patient examined, no change in status, stable for surgery.  I have reviewed the patient's chart and labs.  Questions were answered to the patient's satisfaction.    Heart and Lungs Clear   Carroll Clamp

## 2024-01-24 NOTE — Op Note (Signed)
 Indications: the patient is a 53 yo female who was diagnosed with chronic pain syndrome, failed back syndrome they had a successful trial for spinal cord stimulation, so was consented for placement of a permanent device   Findings: successful placement of a IPG for  spinal cord stimulator.    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     Preoperative Note:    Risks of surgery discussed in clinic.   Operative Note:      The patient was then brought from the preoperative center with intravenous access established.  The patient underwent general anesthesia and endotracheal tube intubation, then was rotated on the [facility] table where all pressure points were appropriately padded.  Preop abx were given, timeout was performed.    The incision on the flank was then opened and a pocket formed until it was large enough for the pulse generator.  We are able to identify the leads and the connector.  We checked the connector 1 more time before disengaging.  We released the locks and divided the connectors distally.  The rest of the tubing was pulled away from the incision.  The remaining pieces were passed off.  Left us  with the primary electrodes which were placed to the permanent battery after being cleansed.  The battery and the pocket were cleaned with Irrisept.  The battery was wrapped in antibiotic containing pouch.  The battery was interrogated.  It was then placed into the pocket and locked into position with the locking screws.  The signal remained steady throughout.  We then irrigated again and closed with multiple layers with skin glue on the top.  She had a reaction to the adhesive from the previous surgery  so we placed a Aquacel dressing.    Patient was then rotated back to the preoperative bed awakened from anesthesia and taken to recovery. All counts are correct in this case.   I performed this procedure without an assistant surgeon  Implant Name Type Inv. Item Serial No. Manufacturer Lot No. LRB No. Used Action  NEUROSTIM INCEPTIV - [id number] H Neuro Prosthesis/Implant Kaye Parsons [id number] H [employer]TR[employer] NEUROMOD PAIN MGMT  Left 1 Implanted  ENVELOPE ABSORB ANTIBACTERIAL - [id number] Mesh General ENVELOPE ABSORB ANTIBACTERIAL  [employer] NEUROMOD PAIN MGMT [id number] Left 1 Implanted    Carroll Clamp, MD

## 2024-01-24 NOTE — Anesthesia Preprocedure Evaluation (Signed)
 Anesthesia Evaluation  Patient identified by MRN, date of birth, ID band Patient awake    Reviewed: Allergy & Precautions, NPO status , Patient's Chart, lab work & pertinent test results  History of Anesthesia Complications (+) PONV and history of anesthetic complications  Airway Mallampati: III  TM Distance: <3 FB Neck ROM: full    Dental  (+) Chipped, Poor Dentition, Missing   Pulmonary asthma    Pulmonary exam normal        Cardiovascular (-) angina (-) Past MI negative cardio ROS Normal cardiovascular exam     Neuro/Psych  Headaches, Seizures -,  PSYCHIATRIC DISORDERS       Neuromuscular disease CVA, Residual Symptoms    GI/Hepatic Neg liver ROS,GERD  Controlled,,  Endo/Other  negative endocrine ROS    Renal/GU Renal disease     Musculoskeletal   Abdominal   Peds  Hematology negative hematology ROS (+)   Anesthesia Other Findings Past Medical History: No date: Acute nontraumatic kidney injury (HCC) No date: Anemia No date: Anxiety No date: Benign cyst of kidney 01/18/2023: Breast cancer (HCC)     Comment:  left breast No date: Cervical cancer (HCC) No date: Chronic back pain No date: Chronic hypotension No date: Chronic kidney disease     Comment:  Previous now clear No date: Claustrophobia No date: Decompression injury of spinal cord No date: Depression No date: Dizziness No date: Failed spinal cord stimulator (HCC) No date: Family history of adverse reaction to anesthesia     Comment:   " my mother takes a long time time wake up" 01/18/2018: History of dysplastic nevus     Comment:  right shoulder, recurrent dysplastic nevus No date: History of kidney stones No date: History of shingles No date: Hypercholesteremia No date: Inappropriate sinus node tachycardia (HCC) No date: Migraine No date: Morbid obesity (HCC) No date: Multiple allergies No date: Neurogenic orthostatic hypotension (HCC) No  date: Obesity No date: Osteoarthritis     Comment:  left hip No date: Pneumonia No date: PONV (postoperative nausea and vomiting)     Comment:  delayed emergence 2021: Seizure (HCC)     Comment:  x1-after a back surgery with spinal cord stimulator No date: Slow to wake up after anesthesia 08/11/2020: Stroke [facility])     Comment:  spinal cord stroke, spinal cord puncture during surgery No date: Wears glasses  Past Surgical History: No date: ABDOMINAL HYSTERECTOMY No date: APPENDECTOMY No date: BACK SURGERY No date: BILIOPANCREATIC DIVISION W DUODENAL SWITCH; N/A 12/06/2022: BREAST BIOPSY; Left     Comment:  u/s bx,1:00 heart clip, path pending 12/06/2022: BREAST BIOPSY; Left     Comment:  us  bx 2:00 ribbon clip path pending 12/06/2022: BREAST BIOPSY; Left     Comment:  US  LT BREAST BX W LOC DEV EA ADD LESION IMG BX SPEC US                GUIDE 12/06/2022 [facility] 12/06/2022: BREAST BIOPSY; Left     Comment:  US  LT BREAST BX W LOC DEV 1ST LESION IMG BX SPEC US                GUIDE 12/06/2022 [facility] 01/18/2023: BREAST RECONSTRUCTION WITH PLACEMENT OF TISSUE EXPANDER  AND FLEX HD (ACELLULAR HYDRATED DERMIS); Bilateral     Comment:  Procedure: IMMEDIATE LEFT BREAST RECONSTRUCTION WITH               PLACEMENT OF TISSUE EXPANDER AND FLEX HD (ACELLULAR  HYDRATED DERMIS);  Surgeon: Thornell Flirt, DO;                Location: [facility];  Service: Plastics;  Laterality:               Bilateral; 03/26/2023: BREAST RECONSTRUCTION WITH PLACEMENT OF TISSUE EXPANDER  AND FLEX HD (ACELLULAR HYDRATED DERMIS); Left     Comment:  Procedure: BREAST RECONSTRUCTION WITH PLACEMENT OF               TISSUE EXPANDER;  Surgeon: Thornell Flirt, DO;                Location: [facility];  Service: Plastics;               Laterality: Left; No date: CHOLECYSTECTOMY 06/27/2021: COLONOSCOPY; N/A     Comment:  Procedure: COLONOSCOPY;  Surgeon: Quintin Buckle,              DO;  Location: [facility] ENDOSCOPY;  Service:               Gastroenterology;  Laterality: N/A; 08/27/2023: COLONOSCOPY; N/A     Comment:  Procedure: COLONOSCOPY;  Surgeon: Quintin Buckle,              DO;  Location: [facility] ENDOSCOPY;  Service:               Gastroenterology;  Laterality: N/A; No date: DIAGNOSTIC LAPAROSCOPY     Comment:  LOA 06/27/2021: ESOPHAGOGASTRODUODENOSCOPY; N/A     Comment:  Procedure: ESOPHAGOGASTRODUODENOSCOPY (EGD);  Surgeon:               Quintin Buckle, DO;  Location: [facility] ENDOSCOPY;                Service: Gastroenterology;  Laterality: N/A; 07/25/2017: ESOPHAGOGASTRODUODENOSCOPY (EGD) WITH PROPOFOL ; N/A     Comment:  Procedure: ESOPHAGOGASTRODUODENOSCOPY (EGD) WITH               PROPOFOL ;  Surgeon: Cassie Click, MD;  Location:               [facility] ENDOSCOPY;  Service: Endoscopy;  Laterality: N/A; No date: HERNIA REPAIR No date: KNEE ARTHROSCOPY; Right No date: LAPAROSCOPIC GASTRIC SLEEVE RESECTION WITH HIATAL HERNIA  REPAIR 08/29/2022: LUMBAR FUSION     Comment:  Revision Lumbar two through five Laminectomy & Fusion               with Transforaminal Lumbar interbody fusion 01/18/2023: MASTECTOMY W/ SENTINEL NODE BIOPSY; Bilateral     Comment:  Procedure: MASTECTOMY WITH SENTINEL LYMPH NODE BIOPSY,               RNFA to assist;  Surgeon: Alben Alma, MD;  Location:               [facility];  Service: General;  Laterality: Bilateral; 06/06/2023: REMOVAL OF TISSUE EXPANDER AND PLACEMENT OF IMPLANT;  Bilateral     Comment:  Procedure: REMOVAL OF TISSUE EXPANDER AND PLACEMENT OF               IMPLANT;  Surgeon: Thornell Flirt, DO;  Location:               [facility];  Service: Plastics;                Laterality: Bilateral; No date: SPINAL CORD STIMULATOR IMPLANT 01/15/2024: THORACIC LAMINECTOMY FOR SPINAL CORD STIMULATOR; N/A  Comment:  Procedure: THORACIC LAMINECTOMY FOR SPINAL CORD                STIMULATOR;  Surgeon: Carroll Clamp, MD;  Location:               [facility];  Service: Neurosurgery;  Laterality: N/A;                THORACIC LAMINECTOMY FOR SPINAL CORD STIMULATOR PADDLE               TRIAL No date: TONSILLECTOMY 01/26/2016: TOTAL HIP ARTHROPLASTY; Left     Comment:  Procedure: LEFT TOTAL HIP ARTHROPLASTY ANTERIOR               APPROACH;  Surgeon: Wes Hamman, MD;  Location: [facility];                Service: Orthopedics;  Laterality: Left; 08/2019: TRANSFORAMINAL LUMBAR INTERBODY FUSION (TLIF) WITH PEDICLE  SCREW FIXATION 3 LEVEL     Comment:  Amalia Badder, MD L2-L5  BMI    Body Mass Index: 44.63 kg/m      Reproductive/Obstetrics negative OB ROS                             Anesthesia Physical Anesthesia Plan  ASA: 3  Anesthesia Plan: General ETT   Post-op Pain Management:    Induction: Intravenous  PONV Risk Score and Plan: Ondansetron , Dexamethasone , Midazolam  and Treatment may vary due to age or medical condition  Airway Management Planned: Oral ETT  Additional Equipment:   Intra-op Plan:   Post-operative Plan: Extubation in OR  Informed Consent: I have reviewed the patients History and Physical, chart, labs and discussed the procedure including the risks, benefits and alternatives for the proposed anesthesia with the patient or authorized representative who has indicated his/her understanding and acceptance.     Dental Advisory Given  Plan Discussed with: Anesthesiologist, CRNA and Surgeon  Anesthesia Plan Comments: (Patient consented for risks of anesthesia including but not limited to:  - adverse reactions to medications - damage to eyes, teeth, lips or other oral mucosa - nerve damage due to positioning  - sore throat or hoarseness - Damage to heart, brain, nerves, lungs, other parts of body or loss of life  Patient voiced understanding and assent.)       Anesthesia Quick Evaluation

## 2024-01-24 NOTE — Anesthesia Postprocedure Evaluation (Signed)
 Anesthesia Post Note  Patient: Vanessa Medina  Procedure(s) Performed: UNILATERAL PULSE GENERATOR IMPLANT  Patient location during evaluation: PACU Anesthesia Type: General Level of consciousness: awake and alert Pain management: pain level controlled Vital Signs Assessment: post-procedure vital signs reviewed and stable Respiratory status: spontaneous breathing, nonlabored ventilation, respiratory function stable and patient connected to nasal cannula oxygen Cardiovascular status: blood pressure returned to baseline and stable Postop Assessment: no apparent nausea or vomiting Anesthetic complications: no   No notable events documented.   Last Vitals:  Vitals:   01/24/24 0915 01/24/24 0935  BP: (!) 140/90 136/63  Pulse: 79 69  Resp: 16 16  Temp: 36.6 C   SpO2: 97% 97%    Last Pain:  Vitals:   01/24/24 0935  TempSrc:   PainSc: Asleep                 Portia Brittle Brittney Caraway

## 2024-01-24 NOTE — Transfer of Care (Signed)
 Immediate Anesthesia Transfer of Care Note  Patient: KIERRE DEINES  Procedure(s) Performed: UNILATERAL PULSE GENERATOR IMPLANT  Patient Location: PACU  Anesthesia Type:General  Level of Consciousness: drowsy  Airway & Oxygen Therapy: Patient Spontanous Breathing and Patient connected to face mask oxygen  Post-op Assessment: Report given to RN, Post -op Vital signs reviewed and stable, and Patient moving all extremities  Post vital signs: Reviewed and stable  Last Vitals:  Vitals Value Taken Time  BP 143/96 01/24/24 0818  Temp    Pulse 95 01/24/24 0823  Resp 15 01/24/24 0823  SpO2 100 % 01/24/24 0823  Vitals shown include unfiled device data.  Last Pain:  Vitals:   01/24/24 0622  TempSrc: Tympanic  PainSc: 4          Complications: No notable events documented.

## 2024-01-24 NOTE — Anesthesia Procedure Notes (Signed)
 Procedure Name: Intubation Date/Time: 01/24/2024 7:27 AM  Performed by: Ezzard Holms, CRNAPre-anesthesia Checklist: Patient identified, Patient being monitored, Timeout performed, Emergency Drugs available and Suction available Patient Re-evaluated:Patient Re-evaluated prior to induction Oxygen Delivery Method: Circle system utilized Preoxygenation: Pre-oxygenation with 100% oxygen Induction Type: IV induction Ventilation: Mask ventilation without difficulty Laryngoscope Size: Mac, 3 and McGrath Grade View: Grade I Tube type: Oral Tube size: 7.0 mm Number of attempts: 1 Airway Equipment and Method: Stylet Placement Confirmation: ETT inserted through vocal cords under direct vision, positive ETCO2 and breath sounds checked- equal and bilateral Secured at: 21 cm Tube secured with: Tape Dental Injury: Teeth and Oropharynx as per pre-operative assessment

## 2024-01-25 ENCOUNTER — Encounter: Payer: Self-pay | Admitting: Neurosurgery

## 2024-01-28 NOTE — Progress Notes (Signed)
   REFERRING PHYSICIAN:  Lyle San, Md [address] [place],  [place] [address]  DOS: 01/24/24  placement of IPG for SCS (medtronic) 01/15/24  placement of thoracic SCS (medtronic)  HISTORY OF PRESENT ILLNESS: Vanessa Medina is approximately 2 weeks status post above surgery. Was given ultram  on discharge from the hospital.   She is having expected incisional pain. She had a rash on her back that she thinks is from adhesive/tape. She has been taking prn benadryl  and it is better. Chronic back and leg pain is about the same.    PHYSICAL EXAMINATION:  General: Patient is well developed, well nourished, calm, collected, and in no apparent distress.   NEUROLOGICAL:  General: In no acute distress.   Awake, alert, oriented to person, place, and time.  Pupils equal round and reactive to light.  Facial tone is symmetric.     Strength:           Side Iliopsoas Quads Hamstring PF DF EHL  R 5 5 5 5 5 5   L 5 5 5 5 5 5    Incisions c/d/I, no rash noted    ROS (Neurologic):  Negative except as noted above  IMAGING: Nothing new to review.   ASSESSMENT/PLAN:  Artina J Dam is doing well s/p above surgery. Treatment options reviewed with patient and following plan made:   - I have advised the patient to lift up to 10 pounds until 6 weeks after surgery (follow up with Dr. Felipe Horton).  - Reviewed wound care.  - No bending, twisting, or lifting.  - Medtr[employer] rep met with patient today for SCS programming.  - Follow up as scheduled in 4 weeks and prn.   Advised to contact the office if any questions or concerns arise.  Lucetta Russel PA-C Department of neurosurgery

## 2024-02-02 ENCOUNTER — Telehealth: Payer: Self-pay | Admitting: Physician Assistant

## 2024-02-02 ENCOUNTER — Other Ambulatory Visit: Payer: Self-pay | Admitting: Orthopaedic Surgery

## 2024-02-03 NOTE — Telephone Encounter (Signed)
 Had placement of IPG on 01/24/24 with Dr Felipe Horton.   Refill of meloxicam  sent to pharmacy. If she is taking the meloxicam  then she should not use the diclofenac  gel. Please let her know.

## 2024-02-06 ENCOUNTER — Ambulatory Visit: Admitting: Podiatry

## 2024-02-06 ENCOUNTER — Encounter: Payer: Self-pay | Admitting: Orthopedic Surgery

## 2024-02-06 ENCOUNTER — Ambulatory Visit (INDEPENDENT_AMBULATORY_CARE_PROVIDER_SITE_OTHER): Admitting: Orthopedic Surgery

## 2024-02-06 VITALS — BP 134/78 | Temp 98.6°F | Ht 64.0 in | Wt 260.0 lb

## 2024-02-06 DIAGNOSIS — M961 Postlaminectomy syndrome, not elsewhere classified: Secondary | ICD-10-CM

## 2024-02-06 DIAGNOSIS — Z9689 Presence of other specified functional implants: Secondary | ICD-10-CM

## 2024-02-12 ENCOUNTER — Encounter: Payer: Self-pay | Admitting: *Deleted

## 2024-02-12 NOTE — Progress Notes (Signed)
 Vanessa Medina called to report she is having severe hair loss since starting Letrozole .   Per Dr. Adrian Alba hold letrozole  until she sees him on 7/3 and then they will decide on whether to reinitiate letrozole  or try a different AI.

## 2024-02-13 ENCOUNTER — Encounter: Payer: Self-pay | Admitting: Podiatry

## 2024-02-13 ENCOUNTER — Ambulatory Visit: Admitting: Podiatry

## 2024-02-13 DIAGNOSIS — M7671 Peroneal tendinitis, right leg: Secondary | ICD-10-CM

## 2024-02-15 ENCOUNTER — Other Ambulatory Visit (INDEPENDENT_AMBULATORY_CARE_PROVIDER_SITE_OTHER): Payer: Self-pay | Admitting: Physician Assistant

## 2024-02-15 DIAGNOSIS — M25611 Stiffness of right shoulder, not elsewhere classified: Secondary | ICD-10-CM

## 2024-02-17 ENCOUNTER — Encounter: Payer: Self-pay | Admitting: Podiatry

## 2024-02-17 NOTE — Progress Notes (Signed)
  Subjective:  Patient ID: Vanessa Medina, female    DOB: 02/24/1971,  MRN: [id number]  Chief Complaint  Patient presents with   Tendonitis    It's doing better.  It has it's moments where it tells me to sit down.    Discussed the use of AI scribe software for clinical note transcription with the patient, who gave verbal consent to proceed.  History of Present Illness Vanessa Medina is a 53 year old female with a history of plantar fasciitis and prior foot surgeries who returns for follow-up with right foot pain.  Has had some improvement      Objective:    Physical Exam VASCULAR: DP and PT pulse palpable. Foot is warm and well-perfused. Capillary fill time is brisk. DERMATOLOGIC: Normal skin turgor, texture, and temperature. No open lesions, rashes, or ulcerations. NEUROLOGIC: Normal sensation to light touch and pressure. No paresthesias on examination. ORTHOPEDIC: Pain on palpation at the fifth metatarsal base, peroneus brevis insertion, right foot, slightly improved. Pain with resisted eversion, right foot. No plantar or lateral pain, right foot. No pain in retromalleolar or distal malleolar area of peroneal tendons, right foot. No ecchymosis or bruising. No gross deformity.   No images are attached to the encounter.    Results    RADIOLOGY Right foot radiograph: enthesopathy at the fifth metatarsal base (12/31/2023)   Assessment:   1. Peroneal tendinitis of right lower extremity      Plan:  Patient was evaluated and treated and all questions answered.  Assessment and Plan Assessment & Plan Insertional peroneal tendonitis Has had some improvement with corticosteroid injection.  Recommended formal physical therapy.  Referral was sent.  Return in 6 weeks.  If no improvement recommend MRI         Return in about 6 weeks (around 03/26/2024) for re-check peroneal tendinitis.

## 2024-02-20 ENCOUNTER — Other Ambulatory Visit: Payer: Self-pay | Admitting: Physician Assistant

## 2024-02-20 MED ORDER — METHOCARBAMOL 500 MG PO TABS: 500.0000 mg | ORAL_TABLET | Freq: Three times a day (TID) | ORAL | 1 refills | Status: DC | PRN

## 2024-02-21 ENCOUNTER — Ambulatory Visit

## 2024-02-22 ENCOUNTER — Other Ambulatory Visit: Payer: Self-pay | Admitting: Physician Assistant

## 2024-02-22 MED ORDER — METHOCARBAMOL 500 MG PO TABS: 500.0000 mg | ORAL_TABLET | Freq: Three times a day (TID) | ORAL | 1 refills | Status: DC | PRN

## 2024-02-22 NOTE — Telephone Encounter (Signed)
 Patient did not request. Automatic request.

## 2024-02-25 ENCOUNTER — Other Ambulatory Visit: Payer: Self-pay

## 2024-02-25 DIAGNOSIS — Z9689 Presence of other specified functional implants: Secondary | ICD-10-CM

## 2024-02-26 ENCOUNTER — Ambulatory Visit: Attending: Podiatry

## 2024-02-26 ENCOUNTER — Encounter: Payer: Self-pay | Admitting: Neurosurgery

## 2024-02-26 DIAGNOSIS — M6281 Muscle weakness (generalized): Secondary | ICD-10-CM | POA: Insufficient documentation

## 2024-02-26 DIAGNOSIS — M79671 Pain in right foot: Secondary | ICD-10-CM | POA: Diagnosis present

## 2024-02-26 DIAGNOSIS — Z9689 Presence of other specified functional implants: Secondary | ICD-10-CM | POA: Insufficient documentation

## 2024-02-26 DIAGNOSIS — R262 Difficulty in walking, not elsewhere classified: Secondary | ICD-10-CM | POA: Insufficient documentation

## 2024-02-26 DIAGNOSIS — R2681 Unsteadiness on feet: Secondary | ICD-10-CM | POA: Insufficient documentation

## 2024-02-26 NOTE — Therapy (Signed)
 OUTPATIENT PHYSICAL THERAPY THORACOLUMBAR/ LOWER EXTREMITY EVALUATION   Patient Name: Vanessa Medina MRN: [id number] DOB:[birth year], 53 y.o., female Today's Date: 02/26/2024  END OF SESSION:  PT End of Session - 02/26/24 1443     Visit Number 1    Number of Visits 24    Date for PT Re-Evaluation 05/20/24    Authorization Type 27 allowed visits    PT Start Time 1445    PT Stop Time 1527    PT Time Calculation (min) 42 min    Equipment Utilized During Treatment Gait belt    Activity Tolerance Patient tolerated treatment well;Treatment limited secondary to medical complications (Comment)    Behavior During Therapy WFL for tasks assessed/performed          Past Medical History:  Diagnosis Date   Acute nontraumatic kidney injury (HCC)    Anemia    Anxiety    Benign cyst of kidney    Breast cancer (HCC) 01/18/2023   left breast   Cervical cancer (HCC)    Chronic back pain    Chronic hypotension    Chronic kidney disease    Previous now clear   Claustrophobia    Decompression injury of spinal cord    Depression    Dizziness    Failed spinal cord stimulator (HCC)    Family history of adverse reaction to anesthesia      my mother takes a long time time wake up   History of dysplastic nevus 01/18/2018   right shoulder, recurrent dysplastic nevus   History of kidney stones    History of shingles    Hypercholesteremia    Inappropriate sinus node tachycardia (HCC)    Migraine    Morbid obesity (HCC)    Multiple allergies    Neurogenic orthostatic hypotension (HCC)    Obesity    Osteoarthritis    left hip   Pneumonia    PONV (postoperative nausea and vomiting)    delayed emergence   Seizure (HCC) 2021   x1-after a back surgery with spinal cord stimulator   Slow to wake up after anesthesia    Stroke (HCC) 08/11/2020   spinal cord stroke, spinal cord puncture during surgery   Wears glasses    Past Surgical History:  Procedure Laterality Date   ABDOMINAL  HYSTERECTOMY     APPENDECTOMY     BACK SURGERY     BILIOPANCREATIC DIVISION W DUODENAL SWITCH N/A    BREAST BIOPSY Left 12/06/2022   u/s bx,1:00 heart clip, path pending   BREAST BIOPSY Left 12/06/2022   us  bx 2:00 ribbon clip path pending   BREAST BIOPSY Left 12/06/2022   US  LT BREAST BX W LOC DEV EA ADD LESION IMG BX SPEC US  GUIDE 12/06/2022 [facility]   BREAST BIOPSY Left 12/06/2022   US  LT BREAST BX W LOC DEV 1ST LESION IMG BX SPEC US  GUIDE 12/06/2022 [facility]   BREAST RECONSTRUCTION WITH PLACEMENT OF TISSUE EXPANDER AND FLEX HD (ACELLULAR HYDRATED DERMIS) Bilateral 01/18/2023   Procedure: IMMEDIATE LEFT BREAST RECONSTRUCTION WITH PLACEMENT OF TISSUE EXPANDER AND FLEX HD (ACELLULAR HYDRATED DERMIS);  Surgeon: [name], DO;  Location: [facility];  Service: Plastics;  Laterality: Bilateral;   BREAST RECONSTRUCTION WITH PLACEMENT OF TISSUE EXPANDER AND FLEX HD (ACELLULAR HYDRATED DERMIS) Left 03/26/2023   Procedure: BREAST RECONSTRUCTION WITH PLACEMENT OF TISSUE EXPANDER;  Surgeon: [name], DO;  Location: [facility];  Service: Plastics;  Laterality: Left;   CHOLECYSTECTOMY     COLONOSCOPY  N/A 06/27/2021   Procedure: COLONOSCOPY;  Surgeon: Onita Elspeth Sharper, DO;  Location: [facility] END[facility]COPY;  Service: Gastroenterology;  Laterality: N/A;   COLONOSCOPY N/A 08/27/2023   Procedure: COLONOSCOPY;  Surgeon: Onita Elspeth Sharper, DO;  Location: [facility] ENDOSCOPY;  Service: Gastroenterology;  Laterality: N/A;   DIAGNOSTIC LAPAROSCOPY     LOA   ESOPHAGOGASTRODUODENOSCOPY N/A 06/27/2021   Procedure: ESOPHAGOGASTRODUODENOSCOPY (EGD);  Surgeon: Onita Elspeth Sharper, DO;  Location: [facility] ENDOSCOPY;  Service: Gastroenterology;  Laterality: N/A;   ESOPHAGOGASTRODUODENOSCOPY (EGD) WITH PROPOFOL  N/A 07/25/2017   Procedure: ESOPHAGOGASTRODUODENOSCOPY (EGD) WITH PROPOFOL ;  Surgeon: Viktoria Lamar DASEN, MD;  Location: [facility] ENDOSCOPY;  Service: Endoscopy;  Laterality:  N/A;   HERNIA REPAIR     KNEE ARTHROSCOPY Right    LAPAROSCOPIC GASTRIC SLEEVE RESECTION WITH HIATAL HERNIA REPAIR     LUMBAR FUSION  08/29/2022   Revision Lumbar two through five Laminectomy & Fusion with Transforaminal Lumbar interbody fusion   MASTECTOMY W/ SENTINEL NODE BIOPSY Bilateral 01/18/2023   Procedure: MASTECTOMY WITH SENTINEL LYMPH NODE BIOPSY, RNFA to assist;  Surgeon: Jordis Laneta FALCON, MD;  Location: [facility];  Service: General;  Laterality: Bilateral;   PULSE GENERATOR IMPLANT N/A 01/24/2024   Procedure: UNILATERAL PULSE GENERATOR IMPLANT;  Surgeon: [name] ORN, MD;  Location: [facility];  Service: Neurosurgery;  Laterality: N/A;  INTERNAL PULSE GENERATOR PLACEMENT FOR SPINAL CORD STIMULATOR   REMOVAL OF TISSUE EXPANDER AND PLACEMENT OF IMPLANT Bilateral 06/06/2023   Procedure: REMOVAL OF TISSUE EXPANDER AND PLACEMENT OF IMPLANT;  Surgeon: [name], DO;  Location: [facility];  Service: Plastics;  Laterality: Bilateral;   SPINAL CORD STIMULATOR IMPLANT     THORACIC LAMINECTOMY FOR SPINAL CORD STIMULATOR N/A 01/15/2024   Procedure: THORACIC LAMINECTOMY FOR SPINAL CORD STIMULATOR;  Surgeon: [name] ORN, MD;  Location: [facility];  Service: Neurosurgery;  Laterality: N/A;  THORACIC LAMINECTOMY FOR SPINAL CORD STIMULATOR PADDLE TRIAL   TONSILLECTOMY     TOTAL HIP ARTHROPLASTY Left 01/26/2016   Procedure: LEFT TOTAL HIP ARTHROPLASTY ANTERIOR APPROACH;  Surgeon: Kay CHRISTELLA Cummins, MD;  Location: [facility];  Service: Orthopedics;  Laterality: Left;   TRANSFORAMINAL LUMBAR INTERBODY FUSION (TLIF) WITH PEDICLE SCREW FIXATION 3 LEVEL  08/2019   Lynwood Better, MD L2-L5   Patient Active Problem List   Diagnosis Date Noted   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   Pain and swelling of lower leg 09/25/2023   Lymphedema 09/25/2023   Long term (current) use of aromatase inhibitors 09/07/2023   At risk for loss of bone density 09/07/2023   Changing skin lesion  09/04/2023   Frequent falls 08/22/2023   Chronic pain syndrome 08/22/2023   Bilateral lower extremity edema 08/06/2023   Back pain 08/06/2023   Claustrophobia    Pernicious anemia 03/14/2023   Ruptured left breast implant 02/13/2023   S/P mastectomy, bilateral 01/18/2023   Genetic testing 12/18/2022   Breast cancer (HCC) 12/08/2022   Family history of breast cancer 12/08/2022   Profound fatigue 12/08/2022   Lumbar post-laminectomy syndrome 06/02/2022   Pseudarthrosis after fusion or arthrodesis 05/19/2022   Primary osteoarthritis of right knee 03/28/2022   Subjective tinnitus of both ears 12/21/2021   Abnormality of gait 04/29/2021   Abnormal sensation in both ears 04/06/2021   Other adverse food reactions, not elsewhere classified, subsequent encounter 04/06/2021   Multiple drug allergies 04/06/2021   Insomnia due to medical condition 02/25/2021   Myofascial muscle pain 02/25/2021   Nerve pain 10/18/2020   Incomplete paraplegia (HCC) 10/18/2020  Orthostatic hypotension 10/18/2020   Renal cyst 09/23/2020   Chronic migraine without aura without status migrainosus, not intractable    Hypokalemia    Adjustment reaction with anxiety and depression    Spinal cord injury, lumbar, without spinal bone injury, sequela (HCC) 08/18/2020   Paraparesis (HCC)    Post-operative pain    Hypotension    Slow transit constipation    AKI (acute kidney injury) (HCC)    Right leg weakness 08/11/2020   DDD (degenerative disc disease), lumbar 09/02/2019    Class: Chronic   Degenerative disc disease, lumbar 09/02/2019   Chest tightness 09/23/2018   Bradycardia 09/23/2018   BMI 40.0-44.9, adult (HCC) 04/17/2018   Status post bariatric surgery 04/17/2018   Labile blood pressure 03/08/2018   Chronic low back pain 09/03/2017   History of total hip replacement, left 01/26/2016   Osteoarthritis of spine with radiculopathy, lumbar region 10/16/2014   LEG PAIN, BILATERAL 12/16/2007   Malignant  neoplasm of cervix uteri (HCC) 07/02/2007   MORBID OBESITY 07/02/2007   DEPRESSION 07/02/2007   Migraine without aura 07/02/2007   Other allergic rhinitis 07/02/2007   Asthma 07/02/2007   GERD 07/02/2007   ELEVATED BLOOD PRESSURE WITHOUT DIAGNOSIS OF HYPERTENSION 07/02/2007   Asthma 07/02/2007    PCP: Lynwood Null, MD  REFERRING PROVIDER: Juliene Medicine, DPM; Lyle Decamp, PA-C  REFERRING DIAG:  [id number] (ICD-10-CM) - Peroneal tendinitis of right lower extremity   Z96.89 (ICD-10-CM) - S/P insertion of spinal cord stimulator   Rationale for Evaluation and Treatment: Rehabilitation  THERAPY DIAG:  Muscle weakness (generalized)  Unsteadiness on feet  Difficulty in walking, not elsewhere classified  Pain in right foot  ONSET DATE: Spinal cord stimulator placed on 01/24/24 Foot pain began around 6 months ago  SUBJECTIVE:                                                                                                                                                                                           SUBJECTIVE STATEMENT: Pt reports that the back pain has its moments. Pt reports she has been having some back spasms since the placement of the spinal cord stimulator. Pt reports that she had a fall about 6 months ago and snapped the tendon on the top of her foot and it shifted the tendon under her foot. Pt reports the pain has been present since this incident.  PERTINENT HISTORY:  Pt is well-known to this clinic. Underwent placement for permanent spinal cord stimulator on 01/24/24 after a successful trial. Pt with a history of diagnosed chronic pain syndrome and failed back surgery syndrome. Pt has a history of plantar fasciitis  and prior foot surgeries and was seen by Podiatrist on 6/11 and was given diagnosis of peroneal tendonitis. Per Podiatry note: Insertional peroneal tendonitis. Has had some improvement with corticosteroid injection.  Recommended formal physical therapy.   Referral was sent.  Return in 6 weeks.  If no improvement recommend MRI Patient has noticed recent increase in hair loss, will return to oncology physician next week to address concerns.   PMH includes anemia, anxiety, breast cancer of L breast, cervical cancer, chronic back pain, chronic hypotension, CKD (now clear), claustrophobia, decompression injury of spinal cord, depression, dizziness, failed spinal cord stimulator, hypercholesteremia, inappropriate sinus node tachycardia, migraine, morbid obesity, neurogenic orthostatic hypotension, OA of L hip, pneumonia, seizure, spinal cord stroke  See chart for full past surgical history  PAIN: Reporting on R foot pain: Are you having pain? Yes: NPRS scale: 3/10 currently, 7/10 at worst, 1/10 Pain location: On the top/lateral aspect of her R foot Pain description: throbbing, stabbing Aggravating factors: walking after about 40 minutes Relieving factors: getting off of it, elevating it  PRECAUTIONS: Back - until 6 weeks post-op (July 3rd, 2025)- pt able to recall precautions without cueing: no bending, lifting, twisting   RED FLAGS: None   WEIGHT BEARING RESTRICTIONS: No  FALLS:  Has patient fallen in last 6 months? Yes. 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  DIAGNOSTIC FINDINGS:  Right foot radiograph:  enthesopathy at the fifth metatarsal base (12/31/2023)   PATIENT SURVEYS:  FADI/mODI to be assessed next session  COGNITION: Overall cognitive status: Within functional limits for tasks assessed     SENSATION: WFL  MUSCLE LENGTH: Deferred at eval due to back precautions Hamstrings:  Thomas test:   POSTURE: rounded shoulders and forward head  PALPATION: Tender on palpation of base of 5th metatarsal and along lateral aspect of R foot  LUMBAR ROM: *Not tested at eval due to back precautions*  AROM eval  Flexion   Extension   Right lateral flexion   Left lateral flexion   Right rotation   Left rotation    (Blank rows = not tested)  LOWER EXTREMITY ROM:     Active  Right eval Left eval  Ankle dorsiflexion 4 7  Ankle plantarflexion 42 50  Ankle inversion 15 25  Ankle eversion 5 15   (Blank rows = not tested)  LOWER EXTREMITY MMT:    MMT Right eval Left eval  Hip flexion 4+ 4+  Hip extension    Hip abduction 4- 4-  Hip adduction 4+ 4+  Hip internal rotation    Hip external rotation    Knee flexion 4- 4-  Knee extension 3+ 4+  Ankle dorsiflexion 4- 4+  Ankle plantarflexion 4- 4+  Ankle inversion    Ankle eversion     (Blank rows = not tested)  LUMBAR SPECIAL TESTS:  Deferred due to back precautions  FUNCTIONAL TESTS:  5 times sit to stand: 24.80 seconds with UE use on armchair 10 meter walk test: 13.52seconds (0.74 m/s)  GAIT: Distance walked: approx. 141ft Assistive device utilized: None Level of assistance: Modified independence Comments: Pt demonstrated step-through pattern throughout with decreased stance time on R LE and mild Trendelenburg compensatory gait pattern on R. Antalgic gait present throughout with narrowed BOS  TREATMENT DATE: 02/26/24                                                                                                                               Eval only    PATIENT EDUCATION:  Education details: Education provided  on examination findings and goals for PT POC. Person educated: Patient Education method: Explanation Education comprehension: verbalized understanding  HOME EXERCISE PROGRAM: To be given next session  ASSESSMENT:  CLINICAL IMPRESSION: Patient is a pleasant 53 y.o. female who was seen today for physical therapy evaluation and treatment for R foot pain and low back pain s/p placement of spinal cord stimulator on 01/24/24. Examination findings today showed decreased overall strength in bilateral LE's and decreased ROM in R ankle when compared to L. Pt also demonstrating difficulty walking and reported foot pain limits her overall mobility. These deficits limit pt's ability to walk for long distances and prevent her from performing ADLs at full capacity. Next session to focus on completing functional outcome measures such as FGA, mODI/FADI, and as well as developing HEP. Pt would benefit from skilled PT intervention to address listed deficits and improve strength, balance, weight bearing tolerance, and overall mobility status.   OBJECTIVE IMPAIRMENTS: Abnormal gait, decreased activity tolerance, decreased balance, decreased endurance, decreased mobility, difficulty walking, decreased ROM, decreased strength, hypomobility, impaired perceived functional ability, impaired flexibility, improper body mechanics, postural dysfunction, and pain.   ACTIVITY LIMITATIONS: carrying, lifting, bending, sitting, standing, squatting, stairs, transfers, bed mobility, bathing, toileting, hygiene/grooming, and locomotion level  PARTICIPATION LIMITATIONS: meal prep, cleaning, laundry, driving, shopping, community activity, and occupation  PERSONAL FACTORS: Age, Past/current experiences, and 3+ comorbidities: anemia, anxiety, breast cancer of L breast, cervical cancer, chronic back pain, chronic hypotension, CKD (now clear), claustrophobia, decompression injury of spinal cord, depression, dizziness, failed spinal cord  stimulator, hypercholesteremia, inappropriate sinus node tachycardia, migraine, morbid obesity, neurogenic orthostatic hypotension, OA of L hip, pneumonia, seizure, spinal cord stroke are also affecting patient's functional outcome.   REHAB POTENTIAL: Good  CLINICAL DECISION MAKING: Evolving/moderate complexity  EVALUATION COMPLEXITY: Moderate   GOALS: Goals reviewed with patient? Yes  SHORT TERM GOALS: Target date: March 18, 2024  1. Patient will be independent in home exercise program to improve strength/mobility for better functional independence with ADLs. Baseline: give next session  Goal status: INITIAL  LONG TERM GOALS: Target date: 04/22/2024   Pt will increase by at least 0.13 m/s in order to demonstrate clinically significant improvement in community ambulation.   Baseline: 0.74 without AD Goal status: INITIAL  2. Pt will  decrease 5TSTS by at least 3 seconds in order to demonstrate clinically significant improvement in LE strength  Baseline: 24.8 seconds with UE use on armrests  Goal status: INITIAL  3.  Pt will decrease mODI scoreby at least 13 points in order demonstrate clinically significant reduction in pain/disability  Baseline: Assess next session Goal status: INITIAL  4. Pt will increase by at least 70m (11ft) in order to demonstrate clinically significant improvement in cardiopulmonary endurance and community ambulation  Baseline: Assess next session Goal status: INITIAL   5.  Patient will increase Functional Gait Assessment score to >20/30 as to reduce fall risk and improve dynamic gait safety with community ambulation. Baseline: Assess next session Goal status: INITIAL  6. Patient will increase BLE gross strength to 4+/5 as to improve functional strength for independent gait, increased standing tolerance and increased ADL ability.  Baseline: see MMT chart above  Goal status: INITIAL  PLAN:  PT FREQUENCY: 2x/week  PT DURATION: 8  weeks  PLANNED INTERVENTIONS: 97164- PT Re-evaluation, 97750- Physical Performance Testing, 97110-Therapeutic exercises, 97530- Therapeutic activity, [id number]- Neuromuscular re-education, 97535- Self Care, [id number]- Manual therapy, [id number]- Gait training, [phone]- Orthotic Initial, [phone]- Orthotic/Prosthetic subsequent, [phone]- Canalith repositioning, [id number]- Splinting, H9716- Electrical stimulation (unattended), [phone]- Electrical stimulation (manual), [id number]- Ultrasound, [id number]- Traction (mechanical), [id number] (1-2 muscles), [id number] (3+ muscles)- Dry Needling, Patient/Family education, Balance training, Stair training, Taping, Joint mobilization, Joint manipulation, Spinal mobilization, Scar mobilization, Compression bandaging, Vestibular training, Visual/preceptual remediation/compensation, Cognitive remediation, DME instructions, Cryotherapy, Moist heat, and Biofeedback.  PLAN FOR NEXT SESSION: Assess , FGA, mODI/FADI, develop HEP, begin functional LE strengthening    Jonny Dearden, SPT  This entire session was performed under direct supervision and direction of a licensed Estate agent . I have personally read, edited and approve of the note as written.   Marina  Moser, PT 02/26/2024, 5:16 PM

## 2024-03-05 ENCOUNTER — Other Ambulatory Visit: Payer: Self-pay | Admitting: *Deleted

## 2024-03-05 ENCOUNTER — Encounter: Admitting: Neurosurgery

## 2024-03-05 ENCOUNTER — Encounter: Payer: Self-pay | Admitting: Neurosurgery

## 2024-03-05 ENCOUNTER — Ambulatory Visit: Admitting: Neurosurgery

## 2024-03-05 ENCOUNTER — Other Ambulatory Visit: Payer: Self-pay | Admitting: Orthopedic Surgery

## 2024-03-05 ENCOUNTER — Ambulatory Visit
Admission: RE | Admit: 2024-03-05 | Discharge: 2024-03-05 | Disposition: A | Discharge status: Home / Self Care | Source: Ambulatory Visit | Attending: Internal Medicine | Admitting: Internal Medicine

## 2024-03-05 VITALS — BP 134/84 | Temp 98.2°F | Ht 64.0 in | Wt 260.0 lb

## 2024-03-05 DIAGNOSIS — Z170 Estrogen receptor positive status [ER+]: Secondary | ICD-10-CM

## 2024-03-05 DIAGNOSIS — Z9689 Presence of other specified functional implants: Secondary | ICD-10-CM

## 2024-03-05 DIAGNOSIS — C50812 Malignant neoplasm of overlapping sites of left female breast: Secondary | ICD-10-CM | POA: Insufficient documentation

## 2024-03-05 DIAGNOSIS — M961 Postlaminectomy syndrome, not elsewhere classified: Secondary | ICD-10-CM

## 2024-03-05 NOTE — Progress Notes (Signed)
   REFERRING PHYSICIAN:  Valora Agent, Md [address] [place],  [place] [address]  DOS: 01/24/24  placement of IPG for SCS (medtronic) 01/15/24  placement of thoracic SCS (medtronic)  HISTORY OF PRESENT ILLNESS:  03/05/24 [name] is a about 6 weeks s/p SCS placement. She had a fall this morning resulting in some increased right sided low back pain and discomfort. She has also been experiencing mid-back muscle spasms which does improve some with the medication she is currently taking. She denies any incisional concerns   02/06/24 Vanessa Medina is approximately 2 weeks status post above surgery. Was given ultram  on discharge from the hospital.   She is having expected incisional pain. She had a rash on her back that she thinks is from adhesive/tape. She has been taking prn benadryl  and it is better. Chronic back and leg pain is about the same.    PHYSICAL EXAMINATION:  General: Patient is well developed, well nourished, calm, collected, and in no apparent distress.   NEUROLOGICAL:  General: In no acute distress.   Awake, alert, oriented to person, place, and time.  Pupils equal round and reactive to light.  Facial tone is symmetric.     Strength:           Side Iliopsoas Quads Hamstring PF DF EHL  R 5 5 5 5 5 5   L 5 5 5 5 5 5    Incisions c/d/I, no rash noted    ROS (Neurologic):  Negative except as noted above  IMAGING: Nothing new to review.   ASSESSMENT/PLAN:  Vanessa Medina is doing well s/p above surgery. We will monitor her right sided low back and hip pain for now as she is just reporting soreness and has been ambulating without difficultly. If it continues we will discuss getting xrays. We will see her back in 3 months to evaluate her progress. She was encouraged to call the office in the interim with any questions or concerns.   Edsel Goods PA-C Department of neurosurgery

## 2024-03-06 ENCOUNTER — Inpatient Hospital Stay (HOSPITAL_BASED_OUTPATIENT_CLINIC_OR_DEPARTMENT_OTHER): Admitting: Oncology

## 2024-03-06 ENCOUNTER — Other Ambulatory Visit: Payer: Medicaid Other

## 2024-03-06 ENCOUNTER — Inpatient Hospital Stay: Attending: Oncology

## 2024-03-06 ENCOUNTER — Ambulatory Visit: Payer: Medicaid Other | Admitting: Internal Medicine

## 2024-03-06 ENCOUNTER — Encounter: Payer: Self-pay | Admitting: Oncology

## 2024-03-06 ENCOUNTER — Ambulatory Visit

## 2024-03-06 VITALS — BP 136/78 | HR 86 | Temp 97.9°F | Resp 18 | Ht 64.0 in | Wt 266.0 lb

## 2024-03-06 DIAGNOSIS — Z170 Estrogen receptor positive status [ER+]: Secondary | ICD-10-CM

## 2024-03-06 DIAGNOSIS — G8929 Other chronic pain: Secondary | ICD-10-CM | POA: Insufficient documentation

## 2024-03-06 DIAGNOSIS — C50912 Malignant neoplasm of unspecified site of left female breast: Secondary | ICD-10-CM | POA: Diagnosis present

## 2024-03-06 DIAGNOSIS — Z1732 Human epidermal growth factor receptor 2 negative status: Secondary | ICD-10-CM | POA: Insufficient documentation

## 2024-03-06 DIAGNOSIS — M549 Dorsalgia, unspecified: Secondary | ICD-10-CM | POA: Diagnosis not present

## 2024-03-06 DIAGNOSIS — Z1721 Progesterone receptor positive status: Secondary | ICD-10-CM | POA: Diagnosis not present

## 2024-03-06 DIAGNOSIS — Z79811 Long term (current) use of aromatase inhibitors: Secondary | ICD-10-CM | POA: Insufficient documentation

## 2024-03-06 DIAGNOSIS — C50812 Malignant neoplasm of overlapping sites of left female breast: Secondary | ICD-10-CM

## 2024-03-06 LAB — CMP (CANCER CENTER ONLY)
ALT: 17 U/L (ref 0–44)
AST: 26 U/L (ref 15–41)
Albumin: 3.9 g/dL (ref 3.5–5.0)
Alkaline Phosphatase: 91 U/L (ref 38–126)
Anion gap: 7 (ref 5–15)
BUN: 14 mg/dL (ref 6–20)
CO2: 19 mmol/L — ABNORMAL LOW (ref 22–32)
Calcium: 8.9 mg/dL (ref 8.9–10.3)
Chloride: 114 mmol/L — ABNORMAL HIGH (ref 98–111)
Creatinine: 1.14 mg/dL — ABNORMAL HIGH (ref 0.44–1.00)
GFR, Estimated: 58 mL/min — ABNORMAL LOW (ref >60)
Glucose, Bld: 112 mg/dL — ABNORMAL HIGH (ref 70–99)
Potassium: 3.3 mmol/L — ABNORMAL LOW (ref 3.5–5.1)
Sodium: 140 mmol/L (ref 135–145)
Total Bilirubin: 0.6 mg/dL (ref 0.0–1.2)
Total Protein: 7.1 g/dL (ref 6.5–8.1)

## 2024-03-06 LAB — CBC WITH DIFFERENTIAL/PLATELET
Abs Immature Granulocytes: 0.01 10*3/uL (ref 0.00–0.07)
Basophils Absolute: 0 10*3/uL (ref 0.0–0.1)
Basophils Relative: 1 %
Eosinophils Absolute: 0.1 10*3/uL (ref 0.0–0.5)
Eosinophils Relative: 4 %
HCT: 37.7 % (ref 36.0–46.0)
Hemoglobin: 11.9 g/dL — ABNORMAL LOW (ref 12.0–15.0)
Immature Granulocytes: 0 %
Lymphocytes Relative: 35 %
Lymphs Abs: 1.2 10*3/uL (ref 0.7–4.0)
MCH: 31.3 pg (ref 26.0–34.0)
MCHC: 31.6 g/dL (ref 30.0–36.0)
MCV: 99.2 fL (ref 80.0–100.0)
Monocytes Absolute: 0.3 10*3/uL (ref 0.1–1.0)
Monocytes Relative: 8 %
Neutro Abs: 1.8 10*3/uL (ref 1.7–7.7)
Neutrophils Relative %: 52 %
Platelets: 278 10*3/uL (ref 150–400)
RBC: 3.8 MIL/uL — ABNORMAL LOW (ref 3.87–5.11)
RDW: 13.1 % (ref 11.5–15.5)
WBC: 3.5 10*3/uL — ABNORMAL LOW (ref 4.0–10.5)
nRBC: 0 % (ref 0.0–0.2)

## 2024-03-06 NOTE — Progress Notes (Signed)
 Having a lot of hair loss for the past couple of months. Has stopped letrozole  to see if that would help. Has not helped so far. Has a lot of breast discomfort in left breast.

## 2024-03-06 NOTE — Progress Notes (Signed)
 [facility]  [phone] [phone]  ID: Vanessa Medina OB: [birth year]  MR#: [id number]  RDW#:[id number]  Patient Care Team: Valora Agent, MD as PCP - General (Family Medicine) [name], MD as PCP - Cardiology (Cardiology) [name], MD as Consulting Physician (Cardiology) [name], DO as Consulting Physician (Neurology) [name], RN as Oncology Nurse Navigator [name] ORN, MD as Consulting Physician (Neurosurgery) [name], MD as Consulting Physician (Oncology)  CHIEF COMPLAINT: Stage Ia ER/PR positive, HER2 negative invasive carcinoma of the left breast.  INTERVAL HISTORY: Patient returns to clinic today for routine 54-month evaluation and discussion on whether or not to initiate aromatase inhibitor.  Previously patient was on tamoxifen , but could not tolerate secondary to significant peripheral edema.  She discontinued letrozole  approximately 54 months ago secondary to hair thinning.  She otherwise feels well and is at her baseline.  There is no neurologic complaints.  She denies any recent fevers or illnesses.  She has a good appetite and denies weight loss.  She has no chest pain, shortness of breath, cough, or hemoptysis.  She denies any nausea, vomiting, constipation, or diarrhea.  She has no urinary complaints.  Patient offers no further specific complaints today.  REVIEW OF SYSTEMS:   Review of Systems  Constitutional: Negative.  Negative for fever, malaise/fatigue and weight loss.  Respiratory: Negative.  Negative for cough, hemoptysis and shortness of breath.   Cardiovascular: Negative.  Negative for chest pain and leg swelling.  Gastrointestinal: Negative.  Negative for abdominal pain.  Genitourinary: Negative.  Negative for dysuria.  Musculoskeletal:  Positive for back pain.  Skin: Negative.  Negative for rash.  Neurological: Negative.  Negative for dizziness, focal weakness, weakness and  headaches.  Psychiatric/Behavioral: Negative.  The patient is not nervous/anxious.     As per HPI. Otherwise, a complete review of systems is negative.  PAST MEDICAL HISTORY: Past Medical History:  Diagnosis Date   Acute nontraumatic kidney injury (HCC)    Anemia    Anxiety    Benign cyst of kidney    Breast cancer (HCC) 01/18/2023   left breast   Cervical cancer (HCC)    Chronic back pain    Chronic hypotension    Chronic kidney disease    Previous now clear   Claustrophobia    Decompression injury of spinal cord    Depression    Dizziness    Failed spinal cord stimulator (HCC)    Family history of adverse reaction to anesthesia      my mother takes a long time time wake up   History of dysplastic nevus 01/18/2018   right shoulder, recurrent dysplastic nevus   History of kidney stones    History of shingles    Hypercholesteremia    Inappropriate sinus node tachycardia (HCC)    Migraine    Morbid obesity (HCC)    Multiple allergies    Neurogenic orthostatic hypotension (HCC)    Obesity    Osteoarthritis    left hip   Pneumonia    PONV (postoperative nausea and vomiting)    delayed emergence   Seizure (HCC) 2021   x1-after a back surgery with spinal cord stimulator   Slow to wake up after anesthesia    Stroke (HCC) 08/11/2020   spinal cord stroke, spinal cord puncture during surgery   Wears glasses     PAST SURGICAL HISTORY: Past Surgical History:  Procedure Laterality Date   ABDOMINAL HYSTERECTOMY  APPENDECTOMY     BACK SURGERY     BILIOPANCREATIC DIVISION W DUODENAL SWITCH N/A    BREAST BIOPSY Left 12/06/2022   u/s bx,1:00 heart clip, path pending   BREAST BIOPSY Left 12/06/2022   us  bx 2:00 ribbon clip path pending   BREAST BIOPSY Left 12/06/2022   US  LT BREAST BX W LOC DEV EA ADD LESION IMG BX SPEC US  GUIDE 12/06/2022 [facility]   BREAST BIOPSY Left 12/06/2022   US  LT BREAST BX W LOC DEV 1ST LESION IMG BX SPEC US  GUIDE 12/06/2022  [facility]   BREAST RECONSTRUCTION WITH PLACEMENT OF TISSUE EXPANDER AND FLEX HD (ACELLULAR HYDRATED DERMIS) Bilateral 01/18/2023   Procedure: IMMEDIATE LEFT BREAST RECONSTRUCTION WITH PLACEMENT OF TISSUE EXPANDER AND FLEX HD (ACELLULAR HYDRATED DERMIS);  Surgeon: [name], DO;  Location: [facility];  Service: Plastics;  Laterality: Bilateral;   BREAST RECONSTRUCTION WITH PLACEMENT OF TISSUE EXPANDER AND FLEX HD (ACELLULAR HYDRATED DERMIS) Left 03/26/2023   Procedure: BREAST RECONSTRUCTION WITH PLACEMENT OF TISSUE EXPANDER;  Surgeon: [name], DO;  Location: [facility];  Service: Plastics;  Laterality: Left;   CHOLECYSTECTOMY     COLONOSCOPY N/A 06/27/2021   Procedure: COLONOSCOPY;  Surgeon: Onita Elspeth Sharper, DO;  Location: [facility] ENDOSCOPY;  Service: Gastroenterology;  Laterality: N/A;   COLONOSCOPY N/A 08/27/2023   Procedure: COLONOSCOPY;  Surgeon: Onita Elspeth Sharper, DO;  Location: [facility] ENDOSCOPY;  Service: Gastroenterology;  Laterality: N/A;   DIAGNOSTIC LAPAROSCOPY     LOA   ESOPHAGOGASTRODUODENOSCOPY N/A 06/27/2021   Procedure: ESOPHAGOGASTRODUODENOSCOPY (EGD);  Surgeon: Onita Elspeth Sharper, DO;  Location: [facility] ENDOSCOPY;  Service: Gastroenterology;  Laterality: N/A;   ESOPHAGOGASTRODUODENOSCOPY (EGD) WITH PROPOFOL  N/A 07/25/2017   Procedure: ESOPHAGOGASTRODUODENOSCOPY (EGD) WITH PROPOFOL ;  Surgeon: Viktoria Lamar DASEN, MD;  Location[facility] ENDOSCOPY;  Service: Endoscopy;  Laterality: N/A;   HERNIA REPAIR     KNEE ARTHROSCOPY Right    LAPAROSCOPIC GASTRIC SLEEVE RESECTION WITH HIATAL HERNIA REPAIR     LUMBAR FUSION  08/29/2022   Revision Lumbar two through five Laminectomy & Fusion with Transforaminal Lumbar interbody fusion   MASTECTOMY W/ SENTINEL NODE BIOPSY Bilateral 01/18/2023   Procedure: MASTECTOMY WITH SENTINEL LYMPH NODE BIOPSY, RNFA to assist;  Surgeon: Jordis Laneta FALCON, MD;  Location: [facility];  Service: General;  Laterality:  Bilateral;   PULSE GENERATOR IMPLANT N/A 01/24/2024   Procedure: UNILATERAL PULSE GENERATOR IMPLANT;  Surgeon: [name] ORN, MD;  Location: [facility];  Service: Neurosurgery;  Laterality: N/A;  INTERNAL PULSE GENERATOR PLACEMENT FOR SPINAL CORD STIMULATOR   REMOVAL OF TISSUE EXPANDER AND PLACEMENT OF IMPLANT Bilateral 06/06/2023   Procedure: REMOVAL OF TISSUE EXPANDER AND PLACEMENT OF IMPLANT;  Surgeon: [name], DO;  Location: [facility];  Service: Plastics;  Laterality: Bilateral;   SPINAL CORD STIMULATOR IMPLANT     THORACIC LAMINECTOMY FOR SPINAL CORD STIMULATOR N/A 01/15/2024   Procedure: THORACIC LAMINECTOMY FOR SPINAL CORD STIMULATOR;  Surgeon: [name] ORN, MD;  Location: [facility];  Service: Neurosurgery;  Laterality: N/A;  THORACIC LAMINECTOMY FOR SPINAL CORD STIMULATOR PADDLE TRIAL   TONSILLECTOMY     TOTAL HIP ARTHROPLASTY Left 01/26/2016   Procedure: LEFT TOTAL HIP ARTHROPLASTY ANTERIOR APPROACH;  Surgeon: Kay CHRISTELLA Cummins, MD;  Location: [facility];  Service: Orthopedics;  Laterality: Left;   TRANSFORAMINAL LUMBAR INTERBODY FUSION (TLIF) WITH PEDICLE SCREW FIXATION 3 LEVEL  08/2019   Lynwood Better, MD L2-L5    FAMILY HISTORY: Family History  Problem Relation Age of Onset  Renal Disease Mother    Hypertension Mother    Sudden Cardiac Death Mother    Heart failure Mother    Valvular heart disease Mother    Heart disease Mother    Heart disease Father        Just had stents placed   Stroke Brother    Heart attack Brother 24   Breast cancer Maternal Grandmother    Diabetes Other     ADVANCED DIRECTIVES (Y/N):  N  HEALTH MAINTENANCE: Social History   Tobacco Use   Smoking status: Never    Passive exposure: Never   Smokeless tobacco: Never  Vaping Use   Vaping status: Never Used  Substance Use Topics   Alcohol use: Not Currently    Comment: occasional wine   Drug use: No     Colonoscopy:  PAP:  Bone density:  Lipid panel:  Allergies   Allergen Reactions   Bee Venom Swelling   Iodinated Contrast Media Anaphylaxis    Swelling  Other reaction(s): Other (See Comments)  Swelling   Swelling   Latex Hives and Rash   Morphine  Other (See Comments)    BRADYCARDIA   Shellfish Allergy Anaphylaxis   Codeine  Nausea And Vomiting   Meperidine Hcl Other (See Comments)    BRADYCARDIA   Bee Pollen Other (See Comments)    Unknown   Oxycodone  Hives and Itching    Blisters on back   Tape Hives    Adhesive   Pentazocine Lactate Nausea And Vomiting    REACTION: vomiting with Talwin NX   Propoxyphene Nausea Only and Nausea And Vomiting    Current Outpatient Medications  Medication Sig Dispense Refill   ALPRAZolam  (XANAX ) 0.25 MG tablet Take 0.25 mg by mouth at bedtime as needed for sleep.     azelastine (ASTELIN) 0.1 % nasal spray Place 1 spray into both nostrils 2 (two) times daily as needed for allergies. Use in each nostril as directed     baclofen  (LIORESAL ) 10 MG tablet Take 1 tablet (10 mg total) by mouth 3 (three) times daily. - for spasms (Patient taking differently: Take 10 mg by mouth 3 (three) times daily as needed. - for spasms) 270 tablet 1   Calcium  Carb-Cholecalciferol (CALCIUM +D3 PO) Take 1 tablet by mouth in the morning and at bedtime.     colestipol (COLESTID) 1 g tablet Take 1 g by mouth at bedtime.     diclofenac  Sodium (VOLTAREN ) 1 % GEL Apply 2 g topically 4 (four) times daily as needed (pain). 300 g 1   docusate sodium  (COLACE) 100 MG capsule Take 1 capsule (100 mg total) by mouth 2 (two) times daily. 10 capsule 0   DULoxetine  (CYMBALTA ) 60 MG capsule Take 2 capsules (120 mg total) by mouth at bedtime. (Patient taking differently: Take 60 mg by mouth 2 (two) times daily.) 180 capsule 3   EPINEPHrine  0.3 mg/0.3 mL IJ SOAJ injection Inject 0.3 mg into the muscle as needed for anaphylaxis.     fluticasone  (FLONASE ) 50 MCG/ACT nasal spray Place 2 sprays into both nostrils daily as needed for allergies.      furosemide (LASIX) 20 MG tablet Take 20 mg by mouth at bedtime as needed for fluid or edema.     gabapentin  (NEURONTIN ) 300 MG capsule Take 1 capsule (300 mg total) by mouth at bedtime. For nerve pain 90 capsule 1   hydrOXYzine  (ATARAX ) 10 MG tablet Take 1 tablet (10 mg total) by mouth 3 (three) times daily as needed. 30  tablet 0   meloxicam  (MOBIC ) 7.5 MG tablet TAKE 1-2 BY MOUTH EVERY DAY WITH FOOD. 60 tablet 0   methocarbamol  (ROBAXIN ) 500 MG tablet Take 1 tablet (500 mg total) by mouth every 8 (eight) hours as needed for muscle spasms. 90 tablet 1   montelukast  (SINGULAIR ) 10 MG tablet TAKE 1 TABLET BY MOUTH AT  BEDTIME FOR ALLERGIES (Patient taking differently: Take 10 mg by mouth at bedtime.) 30 tablet 1   Multiple Vitamin (MULTIVITAMIN WITH MINERALS) TABS tablet Take 1 tablet by mouth 2 (two) times daily.     pramipexole  (MIRAPEX ) 0.125 MG tablet Take 0.5 mg by mouth at bedtime.     pyridOXINE (VITAMIN B-6) 100 MG tablet Take 1 tablet (100 mg total) by mouth daily. 90 tablet 1   thiamine 250 MG tablet Take 250 mg by mouth at bedtime.     tolterodine  (DETROL  LA) 4 MG 24 hr capsule TAKE 1 CAPSULE BY MOUTH EVERY DAY (Patient taking differently: Take 4 mg by mouth every evening.) 90 capsule 0   topiramate  (TOPAMAX ) 100 MG tablet TAKE 1 TABLET BY MOUTH AT  BEDTIME 90 tablet 1   vitamin B-12 (CYANOCOBALAMIN ) 1000 MCG tablet Take 1 tablet (1,000 mcg total) by mouth daily. (Patient taking differently: Take 1,000 mcg by mouth at bedtime.) 90 tablet 1   letrozole  (FEMARA ) 2.5 MG tablet Take 2.5 mg by mouth daily. (Patient not taking: Reported on 03/06/2024)     No current facility-administered medications for this visit.    OBJECTIVE: Vitals:   03/06/24 1006  BP: 136/78  Pulse: 86  Resp: 18  Temp: 97.9 F (36.6 C)  SpO2: 99%     Body mass index is 45.66 kg/m.    ECOG FS:0 - Asymptomatic  General: Well-developed, well-nourished, no acute distress. Eyes: Pink conjunctiva, anicteric  sclera. HEENT: Normocephalic, moist mucous membranes. Lungs: No audible wheezing or coughing. Heart: Regular rate and rhythm. Abdomen: Soft, nontender, no obvious distention. Musculoskeletal: No edema, cyanosis, or clubbing. Neuro: Alert, answering all questions appropriately. Cranial nerves grossly intact. Skin: No rashes or petechiae noted. Psych: Normal affect. Lymphatics: No cervical, calvicular, axillary or inguinal LAD.   LAB RESULTS:  Lab Results  Component Value Date   NA 140 03/06/2024   K 3.3 (L) 03/06/2024   CL 114 (H) 03/06/2024   CO2 19 (L) 03/06/2024   GLUCOSE 112 (H) 03/06/2024   BUN 14 03/06/2024   CREATININE 1.14 (H) 03/06/2024   CALCIUM  8.9 03/06/2024   PROT 7.1 03/06/2024   ALBUMIN  3.9 03/06/2024   AST 26 03/06/2024   ALT 17 03/06/2024   ALKPHOS 91 03/06/2024   BILITOT 0.6 03/06/2024   GFRNONAA 58 (L) 03/06/2024   GFRAA >60 09/06/2019    Lab Results  Component Value Date   WBC 3.5 (L) 03/06/2024   NEUTROABS 1.8 03/06/2024   HGB 11.9 (L) 03/06/2024   HCT 37.7 03/06/2024   MCV 99.2 03/06/2024   PLT 278 03/06/2024     STUDIES: DG Bone Density Result Date: 03/06/2024 EXAM: DUAL X-RAY ABSORPTIOMETRY (DXA) FOR BONE MINERAL DENSITY 03/05/2024 3:21 pm CLINICAL DATA:  52 year old Female Postmenopausal. Screening for osteoporosis TECHNIQUE: An axial (e.g., hips, spine) and/or appendicular (e.g., radius) exam was performed, as appropriate, using GE Secretary/administrator at [facility]. Images are obtained for bone mineral density measurement and are not obtained for diagnostic purposes. [id number] Exclusions: Lumbar spine due to metal hardware, left hip due to metal hardware COMPARISON:  None. FINDINGS:  Scan quality: Good. RIGHT FEMORAL NECK: BMD (in g/cm2): 0.928 T-score: -0.8 Z-score: 0.2 RIGHT TOTAL HIP: BMD (in g/cm2): 0.920 T-score: -0.7 Z-score: -0.1 LEFT FOREARM (RADIUS 33%): BMD (in g/cm2): 0.936 T-score: 0.7 Z-score:  1.0 FRAX 10-YEAR PROBABILITY OF FRACTURE: FRAX not reported as the lowest BMD is not in the osteopenia range. IMPRESSION: Normal based on BMD. Fracture risk is not increased. RECOMMENDATIONS: 1. All patients should optimize calcium  and vitamin D intake. 2. Consider FDA-approved medical therapies in postmenopausal women and men aged 6 years and older, based on the following: - A hip or vertebral (clinical or morphometric) fracture - T-score less than or equal to -2.5 and secondary causes have been excluded. - Low bone mass (T-score between -1.0 and -2.5) and a 10-year probability of a hip fracture greater than or equal to 3% or a 10-year probability of a major osteoporosis-related fracture greater than or equal to 20% based on the [place] -adapted WHO algorithm. - Clinician judgment and/or patient preferences may indicate treatment for people with 10-year fracture probabilities above or below these levels 3. Patients with diagnosis of osteoporosis or at high risk for fracture should have regular bone mineral density tests. For patients eligible for Medicare, routine testing is allowed once every 2 years. The testing frequency can be increased to one year for patients who have rapidly progressing disease, those who are receiving or discontinuing medical therapy to restore bone mass, or have additional risk factors. Electronically Signed   By: Reyes Phi M.D.   On: 03/06/2024 07:48    ASSESSMENT: Stage Ia ER/PR positive, HER2 negative invasive carcinoma of the left breast.  PLAN:    Stage Ia ER/PR positive, HER2 negative invasive carcinoma of the left breast: Patient underwent bilateral mastectomy with immediate reconstruction on Jan 18, 2023.  Oncotype Dx score was reported as 9, therefore she did not require adjuvant chemotherapy.  In the bilateral mastectomy, she did not require adjuvant XRT.  She was initiated on tamoxifen  in June 2024, but this was subsequently discontinued in December 2024 secondary to severe  lower extremity edema.  She was then started on letrozole  which she discontinued in June 2025 secondary to hair thinning.  Patient has been instructed to continue to hold letrozole  for a total of 3 months.  She will then return to clinic for further evaluation and discussion reinitiating an aromatase inhibitor. Breast reconstruction: Continue follow-up with plastic surgery as scheduled. 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 Bone health: Baseline bone mineral density on March 05, 2024 was reported as normal.  Repeat in July 2027.  Continue calcium  and vitamin D supplementation. Chronic back pain/pseudoparesis: Patient underwent lumbar fusion and now has spinal stimulator placement.  Continue follow-up with orthopedics as indicated.   Patient expressed understanding and was in agreement with this plan. She also understands that She can call clinic at any time with any questions, concerns, or complaints.    Cancer Staging  Breast cancer [facility]) Staging form: Breast, AJCC 8th Edition - Clinical stage from 12/06/2022: Stage IA (cT1b, cN0, cM0, G2, ER+, PR+, HER2-) - Signed by Agrawal, Kavita, MD on 02/15/2023 Stage prefix: Initial diagnosis Histologic grading system: 3 grade system   Evalene JINNY Reusing, MD   03/06/2024 11:46 AM

## 2024-03-10 ENCOUNTER — Other Ambulatory Visit: Payer: Self-pay | Admitting: Nurse Practitioner

## 2024-03-10 ENCOUNTER — Other Ambulatory Visit: Admitting: Urology

## 2024-03-12 ENCOUNTER — Ambulatory Visit: Attending: Podiatry

## 2024-03-12 DIAGNOSIS — M25512 Pain in left shoulder: Secondary | ICD-10-CM | POA: Diagnosis present

## 2024-03-12 DIAGNOSIS — M5459 Other low back pain: Secondary | ICD-10-CM | POA: Diagnosis present

## 2024-03-12 DIAGNOSIS — M79671 Pain in right foot: Secondary | ICD-10-CM | POA: Insufficient documentation

## 2024-03-12 DIAGNOSIS — R262 Difficulty in walking, not elsewhere classified: Secondary | ICD-10-CM | POA: Insufficient documentation

## 2024-03-12 DIAGNOSIS — M6281 Muscle weakness (generalized): Secondary | ICD-10-CM | POA: Diagnosis present

## 2024-03-12 DIAGNOSIS — L905 Scar conditions and fibrosis of skin: Secondary | ICD-10-CM | POA: Insufficient documentation

## 2024-03-12 DIAGNOSIS — R2681 Unsteadiness on feet: Secondary | ICD-10-CM | POA: Insufficient documentation

## 2024-03-12 DIAGNOSIS — G8929 Other chronic pain: Secondary | ICD-10-CM | POA: Insufficient documentation

## 2024-03-12 NOTE — Therapy (Signed)
 OUTPATIENT PHYSICAL THERAPY THORACOLUMBAR/ LOWER EXTREMITY TREATMENT   Patient Name: Vanessa Medina MRN: [id number] DOB:04/21/1971, 53 y.o., female Today's Date: 03/12/2024  END OF SESSION:  PT End of Session - 03/12/24 1527     Visit Number 2    Number of Visits 16    Date for PT Re-Evaluation 04/22/24    Authorization Type 27 allowed visits    PT Start Time 1530    PT Stop Time 1615    PT Time Calculation (min) 45 min    Equipment Utilized During Treatment Gait belt    Activity Tolerance Patient tolerated treatment well;Treatment limited secondary to medical complications (Comment)    Behavior During Therapy WFL for tasks assessed/performed           Past Medical History:  Diagnosis Date   Acute nontraumatic kidney injury (HCC)    Anemia    Anxiety    Benign cyst of kidney    Breast cancer (HCC) 01/18/2023   left breast   Cervical cancer (HCC)    Chronic back pain    Chronic hypotension    Chronic kidney disease    Previous now clear   Claustrophobia    Decompression injury of spinal cord    Depression    Dizziness    Failed spinal cord stimulator (HCC)    Family history of adverse reaction to anesthesia      my mother takes a long time time wake up   History of dysplastic nevus 01/18/2018   right shoulder, recurrent dysplastic nevus   History of kidney stones    History of shingles    Hypercholesteremia    Inappropriate sinus node tachycardia (HCC)    Migraine    Morbid obesity (HCC)    Multiple allergies    Neurogenic orthostatic hypotension (HCC)    Obesity    Osteoarthritis    left hip   Pneumonia    PONV (postoperative nausea and vomiting)    delayed emergence   Seizure (HCC) 2021   x1-after a back surgery with spinal cord stimulator   Slow to wake up after anesthesia    Stroke (HCC) 08/11/2020   spinal cord stroke, spinal cord puncture during surgery   Wears glasses    Past Surgical History:  Procedure Laterality Date   ABDOMINAL  HYSTERECTOMY     APPENDECTOMY     BACK SURGERY     BILIOPANCREATIC DIVISION W DUODENAL SWITCH N/A    BREAST BIOPSY Left 12/06/2022   u/s bx,1:00 heart clip, path pending   BREAST BIOPSY Left 12/06/2022   us  bx 2:00 ribbon clip path pending   BREAST BIOPSY Left 12/06/2022   US  LT BREAST BX W LOC DEV EA ADD LESION IMG BX SPEC US  GUIDE 12/06/2022 [facility]   BREAST BIOPSY Left 12/06/2022   US  LT BREAST BX W LOC DEV 1ST LESION IMG BX SPEC US  GUIDE 12/06/2022 [facility]   BREAST RECONSTRUCTION WITH PLACEMENT OF TISSUE EXPANDER AND FLEX HD (ACELLULAR HYDRATED DERMIS) Bilateral 01/18/2023   Procedure: IMMEDIATE LEFT BREAST RECONSTRUCTION WITH PLACEMENT OF TISSUE EXPANDER AND FLEX HD (ACELLULAR HYDRATED DERMIS);  Surgeon: [name], DO;  Location: [facility];  Service: Plastics;  Laterality: Bilateral;   BREAST RECONSTRUCTION WITH PLACEMENT OF TISSUE EXPANDER AND FLEX HD (ACELLULAR HYDRATED DERMIS) Left 03/26/2023   Procedure: BREAST RECONSTRUCTION WITH PLACEMENT OF TISSUE EXPANDER;  Surgeon: [name], DO;  Location:  SURGERY CENTER;  Service: Plastics;  Laterality: Left;   CHOLECYSTECTOMY  COLONOSCOPY N/A 06/27/2021   Procedure: COLONOSCOPY;  Surgeon: Onita Elspeth Sharper, DO;  Location: [facility] ENDOSCOPY;  Service: Gastroenterology;  Laterality: N/A;   COLONOSCOPY N/A 08/27/2023   Procedure: COLONOSCOPY;  Surgeon: Onita Elspeth Sharper, DO;  Location: [facility] ENDOSCOPY;  Service: Gastroenterology;  Laterality: N/A;   DIAGNOSTIC LAPAROSCOPY     LOA   ESOPHAGOGASTRODUODENOSCOPY N/A 06/27/2021   Procedure: ESOPHAGOGASTRODUODENOSCOPY (EGD);  Surgeon: Onita Elspeth Sharper, DO;  Location: [facility] ENDOSCOPY;  Service: Gastroenterology;  Laterality: N/A;   ESOPHAGOGASTRODUODENOSCOPY (EGD) WITH PROPOFOL  N/A 07/25/2017   Procedure: ESOPHAGOGASTRODUODENOSCOPY (EGD) WITH PROPOFOL ;  Surgeon: Viktoria Lamar DASEN, MD;  Location: [facility] ENDOSCOPY;  Service: Endoscopy;  Laterality:  N/A;   HERNIA REPAIR     KNEE ARTHROSCOPY Right    LAPAROSCOPIC GASTRIC SLEEVE RESECTION WITH HIATAL HERNIA REPAIR     LUMBAR FUSION  08/29/2022   Revision Lumbar two through five Laminectomy & Fusion with Transforaminal Lumbar interbody fusion   MASTECTOMY W/ SENTINEL NODE BIOPSY Bilateral 01/18/2023   Procedure: MASTECTOMY WITH SENTINEL LYMPH NODE BIOPSY, RNFA to assist;  Surgeon: Jordis Laneta FALCON, MD;  Location: [facility];  Service: General;  Laterality: Bilateral;   PULSE GENERATOR IMPLANT N/A 01/24/2024   Procedure: UNILATERAL PULSE GENERATOR IMPLANT;  Surgeon: [name] ORN, MD;  Location: [facility];  Service: Neurosurgery;  Laterality: N/A;  INTERNAL PULSE GENERATOR PLACEMENT FOR SPINAL CORD STIMULATOR   REMOVAL OF TISSUE EXPANDER AND PLACEMENT OF IMPLANT Bilateral 06/06/2023   Procedure: REMOVAL OF TISSUE EXPANDER AND PLACEMENT OF IMPLANT;  Surgeon: [name], DO;  Location: [facility];  Service: Plastics;  Laterality: Bilateral;   SPINAL CORD STIMULATOR IMPLANT     THORACIC LAMINECTOMY FOR SPINAL CORD STIMULATOR N/A 01/15/2024   Procedure: THORACIC LAMINECTOMY FOR SPINAL CORD STIMULATOR;  Surgeon: [name] ORN, MD;  Location: [facility];  Service: Neurosurgery;  Laterality: N/A;  THORACIC LAMINECTOMY FOR SPINAL CORD STIMULATOR PADDLE TRIAL   TONSILLECTOMY     TOTAL HIP ARTHROPLASTY Left 01/26/2016   Procedure: LEFT TOTAL HIP ARTHROPLASTY ANTERIOR APPROACH;  Surgeon: Kay CHRISTELLA Cummins, MD;  Location: [facility];  Service: Orthopedics;  Laterality: Left;   TRANSFORAMINAL LUMBAR INTERBODY FUSION (TLIF) WITH PEDICLE SCREW FIXATION 3 LEVEL  08/2019   Lynwood Better, MD L2-L5   Patient Active Problem List   Diagnosis Date Noted   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   Pain and swelling of lower leg 09/25/2023   Lymphedema 09/25/2023   Long term (current) use of aromatase inhibitors 09/07/2023   At risk for loss of bone density 09/07/2023   Changing skin lesion  09/04/2023   Frequent falls 08/22/2023   Chronic pain syndrome 08/22/2023   Bilateral lower extremity edema 08/06/2023   Back pain 08/06/2023   Claustrophobia    Pernicious anemia 03/14/2023   Ruptured left breast implant 02/13/2023   S/P mastectomy, bilateral 01/18/2023   Genetic testing 12/18/2022   Breast cancer (HCC) 12/08/2022   Family history of breast cancer 12/08/2022   Profound fatigue 12/08/2022   Lumbar post-laminectomy syndrome 06/02/2022   Pseudarthrosis after fusion or arthrodesis 05/19/2022   Primary osteoarthritis of right knee 03/28/2022   Subjective tinnitus of both ears 12/21/2021   Abnormality of gait 04/29/2021   Abnormal sensation in both ears 04/06/2021   Other adverse food reactions, not elsewhere classified, subsequent encounter 04/06/2021   Multiple drug allergies 04/06/2021   Insomnia due to medical condition 02/25/2021   Myofascial muscle pain 02/25/2021   Nerve pain 10/18/2020   Incomplete paraplegia (HCC)  10/18/2020   Orthostatic hypotension 10/18/2020   Renal cyst 09/23/2020   Chronic migraine without aura without status migrainosus, not intractable    Hypokalemia    Adjustment reaction with anxiety and depression    Spinal cord injury, lumbar, without spinal bone injury, sequela (HCC) 08/18/2020   Paraparesis (HCC)    Post-operative pain    Hypotension    Slow transit constipation    AKI (acute kidney injury) (HCC)    Right leg weakness 08/11/2020   DDD (degenerative disc disease), lumbar 09/02/2019    Class: Chronic   Degenerative disc disease, lumbar 09/02/2019   Chest tightness 09/23/2018   Bradycardia 09/23/2018   BMI 40.0-44.9, adult (HCC) 04/17/2018   Status post bariatric surgery 04/17/2018   Labile blood pressure 03/08/2018   Chronic low back pain 09/03/2017   History of total hip replacement, left 01/26/2016   Osteoarthritis of spine with radiculopathy, lumbar region 10/16/2014   LEG PAIN, BILATERAL 12/16/2007   Malignant  neoplasm of cervix uteri (HCC) 07/02/2007   MORBID OBESITY 07/02/2007   DEPRESSION 07/02/2007   Migraine without aura 07/02/2007   Other allergic rhinitis 07/02/2007   Asthma 07/02/2007   GERD 07/02/2007   ELEVATED BLOOD PRESSURE WITHOUT DIAGNOSIS OF HYPERTENSION 07/02/2007   Asthma 07/02/2007    PCP: Lynwood Null, MD  REFERRING PROVIDER: Juliene Medicine, DPM; Lyle Decamp, PA-C  REFERRING DIAG:  [phone] (ICD-10-CM) - Peroneal tendinitis of right lower extremity   Z96.89 (ICD-10-CM) - S/P insertion of spinal cord stimulator   Rationale for Evaluation and Treatment: Rehabilitation  THERAPY DIAG:  Muscle weakness (generalized)  Unsteadiness on feet  Difficulty in walking, not elsewhere classified  Pain in right foot  Other low back pain  ONSET DATE: Spinal cord stimulator placed on 01/24/24 Foot pain began around 6 months ago  SUBJECTIVE:                                                                                                                                                                                           SUBJECTIVE STATEMENT: Pt reports that she was not feeling well the other day due to the heat. Pt states that her foot has been doing fine. Pt is receiving an injection in both knees next Friday for arthritis. Pt says that she saw a rep for the spinal cord stimulator and had the settings reset and it feels like it has been working better now.   PERTINENT HISTORY:  Pt is well-known to this clinic. Underwent placement for permanent spinal cord stimulator on 01/24/24 after a successful trial. Pt with a history of diagnosed chronic pain syndrome and failed back surgery syndrome.  Pt has a history of plantar fasciitis and prior foot surgeries and was seen by Podiatrist on 6/11 and was given diagnosis of peroneal tendonitis. Per Podiatry note: Insertional peroneal tendonitis. Has had some improvement with corticosteroid injection.  Recommended formal physical therapy.   Referral was sent.  Return in 6 weeks.  If no improvement recommend MRI Patient has noticed recent increase in hair loss, will return to oncology physician next week to address concerns.   PMH includes anemia, anxiety, breast cancer of L breast, cervical cancer, chronic back pain, chronic hypotension, CKD (now clear), claustrophobia, decompression injury of spinal cord, depression, dizziness, failed spinal cord stimulator, hypercholesteremia, inappropriate sinus node tachycardia, migraine, morbid obesity, neurogenic orthostatic hypotension, OA of L hip, pneumonia, seizure, spinal cord stroke  See chart for full past surgical history  PAIN: Reporting on R foot pain: Are you having pain? Yes: NPRS scale: 3/10 currently, 7/10 at worst, 1/10 Pain location: On the top/lateral aspect of her R foot Pain description: throbbing, stabbing Aggravating factors: walking after about 40 minutes Relieving factors: getting off of it, elevating it  PRECAUTIONS: Back - until 6 weeks post-op (July 3rd, 2025)- pt able to recall precautions without cueing: no bending, lifting, twisting   RED FLAGS: None   WEIGHT BEARING RESTRICTIONS: No  FALLS:  Has patient fallen in last 6 months? Yes. 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  DIAGNOSTIC FINDINGS:  Right foot radiograph:  enthesopathy at the fifth metatarsal base (12/31/2023)   PATIENT SURVEYS:  mODI: 36 / 50 = 72.0 %  FADI: 24/50- 48%  COGNITION: Overall cognitive status: Within functional limits for tasks assessed     SENSATION: WFL  MUSCLE LENGTH: Deferred at eval due to back precautions Hamstrings:  Thomas test:   POSTURE: rounded shoulders and forward head  PALPATION: Tender on palpation of base of 5th metatarsal and along lateral aspect of R foot  LUMBAR ROM: *Not tested at eval due to back precautions*  AROM eval  Flexion   Extension   Right lateral flexion   Left lateral flexion   Right rotation   Left rotation    (Blank rows = not tested)  LOWER EXTREMITY ROM:     Active  Right eval Left eval  Ankle dorsiflexion 4 7  Ankle plantarflexion 42 50  Ankle inversion 15 25  Ankle eversion 5 15   (Blank rows = not tested)  LOWER EXTREMITY MMT:    MMT Right eval Left eval  Hip flexion 4+ 4+  Hip extension    Hip abduction 4- 4-  Hip adduction 4+ 4+  Hip internal rotation    Hip external rotation    Knee flexion 4- 4-  Knee extension 3+ 4+  Ankle dorsiflexion 4- 4+  Ankle plantarflexion 4- 4+  Ankle inversion    Ankle eversion     (Blank rows =  not tested)  LUMBAR SPECIAL TESTS:  Deferred due to back precautions  FUNCTIONAL TESTS:  5 times sit to stand: 24.80 seconds with UE use on armchair 10 meter walk test: 13.52seconds (0.74 m/s) FGA: 13/30 on 7/9  GAIT: Distance walked: approx. 176ft Assistive device utilized: None Level of assistance: Modified independence Comments: Pt demonstrated step-through pattern throughout with decreased stance time on R LE and mild Trendelenburg compensatory gait pattern on R. Antalgic gait present throughout with narrowed BOS  TREATMENT DATE: 03/12/24                                                                                                                               Physical Performance Measures: Functional Gait  Assessment:  [facility] PT Assessment - 03/12/24 0001       Functional Gait  Assessment   Gait assessed  Yes    Gait Level Surface Walks 20 ft in less than 7 sec but greater than 5.5 sec, uses assistive device, slower speed, mild gait deviations, or deviates 6-10 in outside of the 12 in walkway width.    Change in Gait Speed Makes only minor adjustments to walking speed, or accomplishes a change in speed with significant gait deviations, deviates 10-15 in outside the 12 in walkway width, or changes speed but loses balance but is able to recover and continue walking.    Gait with Horizontal Head Turns Performs head turns with moderate changes in gait velocity, slows down, deviates 10-15 in outside 12 in walkway width but recovers, can continue to walk.    Gait with Vertical Head Turns Performs task with slight change in gait velocity (eg, minor disruption to smooth gait path), deviates 6 - 10 in outside 12 in walkway width or uses assistive device    Gait and Pivot Turn Pivot turns safely in greater than 3 sec and stops with no loss of balance, or pivot turns safely within 3 sec and stops with mild imbalance, requires small steps to catch balance.    Step Over Obstacle Is able to step over one shoe box (4.5 in total height) but must slow down and adjust steps to clear box safely. May require verbal cueing.    Gait with Narrow Base of Support Ambulates less than 4 steps heel to toe or cannot perform without assistance.    Gait with Eyes Closed Walks 20 ft, slow speed, abnormal gait pattern, evidence for imbalance, deviates 10-15 in outside 12 in walkway width. Requires more than 9 sec to ambulate 20 ft.    Ambulating Backwards Walks 20 ft, uses assistive device, slower speed, mild gait deviations, deviates 6-10 in outside 12 in walkway width.    Steps Two feet to a stair, must use rail.    Total Score 13           Self-Care/Home Management: - HEP below discussed, demonstrated, and performed during  session to ensure pt is able to perform properly at  home  PATIENT EDUCATION:  Education details: Education provided on examination findings and goals for PT POC. Person educated: Patient Education method: Explanation Education comprehension: verbalized understanding  HOME EXERCISE PROGRAM: Access Code: [email or web address]  URL: [email or web address]  Date: 03/12/2024  repared by: Marina  Moser  Exercises: - Seated Ankle Plantarflexion with Resistance  - 1 x daily - 7 x weekly - 2 sets - 10 reps - 5 hold  - Seated Ankle Eversion with Resistance  - 1 x daily - 7 x weekly - 2 sets - 10 reps - 5 hold  - Seated Ankle Inversion with Resistance and Legs Crossed  - 1 x daily - 7 x weekly - 2 sets - 10 reps - 5 hold  - Seated Ankle Dorsiflexion with Resistance  - 1 x daily - 7 x weekly - 2 sets - 10 reps - 5 hold  - Seated Ankle Alphabet  - 1 x daily - 7 x weekly - 2 sets - 10 reps - 5 hold - Supine Posterior Pelvic Tilt  - 1 x daily - 7 x weekly - 2 sets - 10 reps - 5 hold  - Supine Bridge with Pelvic Floor Contraction  - 1 x daily - 7 x weekly - 2 sets - 10 reps - 5 hold   ASSESSMENT:  CLINICAL IMPRESSION: deferred this session due to pt wearing improper footwear today. Pt's FGA score today is indicative of pt being at increased risk of falls, with particular difficulty with stairs, obstacle navigation, and gait with narrowed BOS. Pt with multiple instances of LOB, mostly to R side, but all were recoverable by patient with SPT assistance. Pt receptive to education provided on HEP today and was engaged throughout session. Pt would benefit from skilled PT intervention to address listed deficits and improve strength, balance, weight bearing tolerance, and overall mobility status.   OBJECTIVE IMPAIRMENTS: Abnormal gait, decreased activity tolerance, decreased balance, decreased endurance, decreased mobility, difficulty walking, decreased ROM, decreased strength, hypomobility, impaired perceived  functional ability, impaired flexibility, improper body mechanics, postural dysfunction, and pain.   ACTIVITY LIMITATIONS: carrying, lifting, bending, sitting, standing, squatting, stairs, transfers, bed mobility, bathing, toileting, hygiene/grooming, and locomotion level  PARTICIPATION LIMITATIONS: meal prep, cleaning, laundry, driving, shopping, community activity, and occupation  PERSONAL FACTORS: Age, Past/current experiences, and 3+ comorbidities: anemia, anxiety, breast cancer of L breast, cervical cancer, chronic back pain, chronic hypotension, CKD (now clear), claustrophobia, decompression injury of spinal cord, depression, dizziness, failed spinal cord stimulator, hypercholesteremia, inappropriate sinus node tachycardia, migraine, morbid obesity, neurogenic orthostatic hypotension, OA of L hip, pneumonia, seizure, spinal cord stroke are also affecting patient's functional outcome.   REHAB POTENTIAL: Good  CLINICAL DECISION MAKING: Evolving/moderate complexity  EVALUATION COMPLEXITY: Moderate   GOALS: Goals reviewed with patient? Yes  SHORT TERM GOALS: Target date: March 18, 2024  1. Patient will be independent in home exercise program to improve strength/mobility for better functional independence with ADLs. Baseline: give next session  Goal status: INITIAL  LONG TERM GOALS: Target date: 04/22/2024   Pt will increase by at least 0.13 m/s in order to demonstrate clinically significant improvement in community ambulation.   Baseline: 0.74 without AD Goal status: INITIAL  2. Pt will decrease 5TSTS by at least 3 seconds in order to demonstrate clinically significant improvement in LE strength  Baseline: 24.8 seconds with UE use on armrests  Goal status: INITIAL  3.  Pt will decrease mODI scoreby at least 13 points in order demonstrate clinically significant reduction in  pain/disability  Baseline: 36 / 50 = 72.0 % on 7/9 Goal status: INITIAL  4. Pt will increase by  at least 22m (142ft) in order to demonstrate clinically significant improvement in cardiopulmonary endurance and community ambulation  Baseline: Assess next session Goal status: INITIAL   5.  Patient will increase Functional Gait Assessment score to >20/30 as to reduce fall risk and improve dynamic gait safety with community ambulation. Baseline: 13/30 on 7/9 Goal status: INITIAL  6. Patient will increase BLE gross strength to 4+/5 as to improve functional strength for independent gait, increased standing tolerance and increased ADL ability.  Baseline: see MMT chart above  Goal status: INITIAL  PLAN:  PT FREQUENCY: 2x/week  PT DURATION: 8 weeks  PLANNED INTERVENTIONS: [id number]- PT Re-evaluation, 97750- Physical Performance Testing, 97110-Therapeutic exercises, 97530- Therapeutic activity, [id number]- Neuromuscular re-education, 97535- Self Care, [id number]- Manual therapy, [id number]- Gait training, [phone]- Orthotic Initial, [phone]- Orthotic/Prosthetic subsequent, [phone]- Canalith repositioning, [id number]- Splinting, [id number]- Electrical stimulation (unattended), [phone]- Electrical stimulation (manual), [id number]- Ultrasound, [id number]- Traction (mechanical), [id number] (1-2 muscles), [id number] (3+ muscles)- Dry Needling, Patient/Family education, Balance training, Stair training, Taping, Joint mobilization, Joint manipulation, Spinal mobilization, Scar mobilization, Compression bandaging, Vestibular training, Visual/preceptual remediation/compensation, Cognitive remediation, DME instructions, Cryotherapy, Moist heat, and Biofeedback.  PLAN FOR NEXT SESSION: Assess , begin functional LE strengthening, dynamic balance training    Danne Scardina, SPT  This entire session was performed under direct supervision and direction of a licensed therapist/therapist assistant . I have personally read, edited and approve of the note as written.   Marina  Moser, PT 03/12/2024, 5:04 PM

## 2024-03-18 ENCOUNTER — Ambulatory Visit: Payer: Medicaid Other | Admitting: Plastic Surgery

## 2024-03-18 ENCOUNTER — Encounter: Payer: Self-pay | Admitting: *Deleted

## 2024-03-18 MED ORDER — TOLTERODINE TARTRATE ER 4 MG PO CP24: 4.0000 mg | ORAL_CAPSULE | Freq: Every evening | ORAL | 3 refills | Status: AC

## 2024-03-18 NOTE — Progress Notes (Signed)
 Ms. Mckellips called because her Detrol  [place] rx was denied since Dr. Clista previously prescribed it and is no longer at the cancer center.   New rx sent by Dr. Jacobo.   She was also concerned about her potassium level of 3.3.  Encouraged her to increase potassium rich foods in her diet.   She asked about her thryoid being checked, she will need to schedule an appointment with her PCP to have that checked due to the PCP would be the one to manage abnormal thyroid  function.   She will call and make an appt. With her PCP

## 2024-03-19 ENCOUNTER — Ambulatory Visit

## 2024-03-19 DIAGNOSIS — M79671 Pain in right foot: Secondary | ICD-10-CM

## 2024-03-19 DIAGNOSIS — M6281 Muscle weakness (generalized): Secondary | ICD-10-CM | POA: Diagnosis not present

## 2024-03-19 DIAGNOSIS — L905 Scar conditions and fibrosis of skin: Secondary | ICD-10-CM

## 2024-03-19 DIAGNOSIS — G8929 Other chronic pain: Secondary | ICD-10-CM

## 2024-03-19 DIAGNOSIS — M5459 Other low back pain: Secondary | ICD-10-CM

## 2024-03-19 DIAGNOSIS — R2681 Unsteadiness on feet: Secondary | ICD-10-CM

## 2024-03-19 DIAGNOSIS — R262 Difficulty in walking, not elsewhere classified: Secondary | ICD-10-CM

## 2024-03-19 NOTE — Therapy (Signed)
 OUTPATIENT PHYSICAL THERAPY THORACOLUMBAR/ LOWER EXTREMITY TREATMENT   Patient Name: Vanessa Medina MRN: [id number] DOB:05/06/1971, 53 y.o., female Today's Date: 03/19/2024  END OF SESSION:  PT End of Session - 03/19/24 1018     Visit Number 3    Number of Visits 16    Date for PT Re-Evaluation 04/22/24    Authorization Type 27 allowed visits    Progress Note Due on Visit 10    PT Start Time 1015    PT Stop Time 1059    PT Time Calculation (min) 44 min    Equipment Utilized During Treatment Gait belt    Activity Tolerance Patient tolerated treatment well;Treatment limited secondary to medical complications (Comment)    Behavior During Therapy WFL for tasks assessed/performed           Past Medical History:  Diagnosis Date   Acute nontraumatic kidney injury (HCC)    Anemia    Anxiety    Benign cyst of kidney    Breast cancer (HCC) 01/18/2023   left breast   Cervical cancer (HCC)    Chronic back pain    Chronic hypotension    Chronic kidney disease    Previous now clear   Claustrophobia    Decompression injury of spinal cord    Depression    Dizziness    Failed spinal cord stimulator (HCC)    Family history of adverse reaction to anesthesia      my mother takes a long time time wake up   History of dysplastic nevus 01/18/2018   right shoulder, recurrent dysplastic nevus   History of kidney stones    History of shingles    Hypercholesteremia    Inappropriate sinus node tachycardia (HCC)    Migraine    Morbid obesity (HCC)    Multiple allergies    Neurogenic orthostatic hypotension (HCC)    Obesity    Osteoarthritis    left hip   Pneumonia    PONV (postoperative nausea and vomiting)    delayed emergence   Seizure (HCC) 2021   x1-after a back surgery with spinal cord stimulator   Slow to wake up after anesthesia    Stroke (HCC) 08/11/2020   spinal cord stroke, spinal cord puncture during surgery   Wears glasses    Past Surgical History:   Procedure Laterality Date   ABDOMINAL HYSTERECTOMY     APPENDECTOMY     BACK SURGERY     BILIOPANCREATIC DIVISION W DUODENAL SWITCH N/A    BREAST BIOPSY Left 12/06/2022   u/s bx,1:00 heart clip, path pending   BREAST BIOPSY Left 12/06/2022   us  bx 2:00 ribbon clip path pending   BREAST BIOPSY Left 12/06/2022   US  LT BREAST BX W LOC DEV EA ADD LESION IMG BX SPEC US  GUIDE 12/06/2022 [facility]   BREAST BIOPSY Left 12/06/2022   US  LT BREAST BX W LOC DEV 1ST LESION IMG BX SPEC US  GUIDE 12/06/2022 [facility]   BREAST RECONSTRUCTION WITH PLACEMENT OF TISSUE EXPANDER AND FLEX HD (ACELLULAR HYDRATED DERMIS) Bilateral 01/18/2023   Procedure: IMMEDIATE LEFT BREAST RECONSTRUCTION WITH PLACEMENT OF TISSUE EXPANDER AND FLEX HD (ACELLULAR HYDRATED DERMIS);  Surgeon: [name], DO;  Location: [facility];  Service: Plastics;  Laterality: Bilateral;   BREAST RECONSTRUCTION WITH PLACEMENT OF TISSUE EXPANDER AND FLEX HD (ACELLULAR HYDRATED DERMIS) Left 03/26/2023   Procedure: BREAST RECONSTRUCTION WITH PLACEMENT OF TISSUE EXPANDER;  Surgeon: [name], DO;  Location: [facility];  Service: Plastics;  Laterality: Left;   CHOLECYSTECTOMY     COLONOSCOPY N/A 06/27/2021   Procedure: COLONOSCOPY;  Surgeon: Onita Elspeth Sharper, DO;  Location: [facility] ENDOSCOPY;  Service: Gastroenterology;  Laterality: N/A;   COLONOSCOPY N/A 08/27/2023   Procedure: COLONOSCOPY;  Surgeon: Onita Elspeth Sharper, DO;  Location: [facility] ENDOSCOPY;  Service: Gastroenterology;  Laterality: N/A;   DIAGNOSTIC LAPAROSCOPY     LOA   ESOPHAGOGASTRODUODENOSCOPY N/A 06/27/2021   Procedure: ESOPHAGOGASTRODUODENOSCOPY (EGD);  Surgeon: Onita Elspeth Sharper, DO;  Location: [facility] ENDOSCOPY;  Service: Gastroenterology;  Laterality: N/A;   ESOPHAGOGASTRODUODENOSCOPY (EGD) WITH PROPOFOL  N/A 07/25/2017   Procedure: ESOPHAGOGASTRODUODENOSCOPY (EGD) WITH PROPOFOL ;  Surgeon: Viktoria Lamar DASEN, MD;  Location: [facility]  ENDOSCOPY;  Service: Endoscopy;  Laterality: N/A;   HERNIA REPAIR     KNEE ARTHROSCOPY Right    LAPAROSCOPIC GASTRIC SLEEVE RESECTION WITH HIATAL HERNIA REPAIR     LUMBAR FUSION  08/29/2022   Revision Lumbar two through five Laminectomy & Fusion with Transforaminal Lumbar interbody fusion   MASTECTOMY W/ SENTINEL NODE BIOPSY Bilateral 01/18/2023   Procedure: MASTECTOMY WITH SENTINEL LYMPH NODE BIOPSY, RNFA to assist;  Surgeon: Jordis Laneta FALCON, MD;  Location: [facility];  Service: General;  Laterality: Bilateral;   PULSE GENERATOR IMPLANT N/A 01/24/2024   Procedure: UNILATERAL PULSE GENERATOR IMPLANT;  Surgeon: [name] ORN, MD;  Location: [facility];  Service: Neurosurgery;  Laterality: N/A;  INTERNAL PULSE GENERATOR PLACEMENT FOR SPINAL CORD STIMULATOR   REMOVAL OF TISSUE EXPANDER AND PLACEMENT OF IMPLANT Bilateral 06/06/2023   Procedure: REMOVAL OF TISSUE EXPANDER AND PLACEMENT OF IMPLANT;  Surgeon: [name], DO;  Location: [facility];  Service: Plastics;  Laterality: Bilateral;   SPINAL CORD STIMULATOR IMPLANT     THORACIC LAMINECTOMY FOR SPINAL CORD STIMULATOR N/A 01/15/2024   Procedure: THORACIC LAMINECTOMY FOR SPINAL CORD STIMULATOR;  Surgeon: [name] ORN, MD;  Location: [facility];  Service: Neurosurgery;  Laterality: N/A;  THORACIC LAMINECTOMY FOR SPINAL CORD STIMULATOR PADDLE TRIAL   TONSILLECTOMY     TOTAL HIP ARTHROPLASTY Left 01/26/2016   Procedure: LEFT TOTAL HIP ARTHROPLASTY ANTERIOR APPROACH;  Surgeon: Kay CHRISTELLA Cummins, MD;  Location: [facility];  Service: Orthopedics;  Laterality: Left;   TRANSFORAMINAL LUMBAR INTERBODY FUSION (TLIF) WITH PEDICLE SCREW FIXATION 3 LEVEL  08/2019   Lynwood Better, MD L2-L5   Patient Active Problem List   Diagnosis Date Noted   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   Pain and swelling of lower leg 09/25/2023   Lymphedema 09/25/2023   Long term (current) use of aromatase inhibitors 09/07/2023   At risk for loss of bone density  09/07/2023   Changing skin lesion 09/04/2023   Frequent falls 08/22/2023   Chronic pain syndrome 08/22/2023   Bilateral lower extremity edema 08/06/2023   Back pain 08/06/2023   Claustrophobia    Pernicious anemia 03/14/2023   Ruptured left breast implant 02/13/2023   S/P mastectomy, bilateral 01/18/2023   Genetic testing 12/18/2022   Breast cancer (HCC) 12/08/2022   Family history of breast cancer 12/08/2022   Profound fatigue 12/08/2022   Lumbar post-laminectomy syndrome 06/02/2022   Pseudarthrosis after fusion or arthrodesis 05/19/2022   Primary osteoarthritis of right knee 03/28/2022   Subjective tinnitus of both ears 12/21/2021   Abnormality of gait 04/29/2021   Abnormal sensation in both ears 04/06/2021   Other adverse food reactions, not elsewhere classified, subsequent encounter 04/06/2021   Multiple drug allergies 04/06/2021   Insomnia due to medical condition 02/25/2021   Myofascial muscle pain 02/25/2021  Nerve pain 10/18/2020   Incomplete paraplegia (HCC) 10/18/2020   Orthostatic hypotension 10/18/2020   Renal cyst 09/23/2020   Chronic migraine without aura without status migrainosus, not intractable    Hypokalemia    Adjustment reaction with anxiety and depression    Spinal cord injury, lumbar, without spinal bone injury, sequela (HCC) 08/18/2020   Paraparesis (HCC)    Post-operative pain    Hypotension    Slow transit constipation    AKI (acute kidney injury) (HCC)    Right leg weakness 08/11/2020   DDD (degenerative disc disease), lumbar 09/02/2019    Class: Chronic   Degenerative disc disease, lumbar 09/02/2019   Chest tightness 09/23/2018   Bradycardia 09/23/2018   BMI 40.0-44.9, adult (HCC) 04/17/2018   Status post bariatric surgery 04/17/2018   Labile blood pressure 03/08/2018   Chronic low back pain 09/03/2017   History of total hip replacement, left 01/26/2016   Osteoarthritis of spine with radiculopathy, lumbar region 10/16/2014   LEG PAIN,  BILATERAL 12/16/2007   Malignant neoplasm of cervix uteri (HCC) 07/02/2007   MORBID OBESITY 07/02/2007   DEPRESSION 07/02/2007   Migraine without aura 07/02/2007   Other allergic rhinitis 07/02/2007   Asthma 07/02/2007   GERD 07/02/2007   ELEVATED BLOOD PRESSURE WITHOUT DIAGNOSIS OF HYPERTENSION 07/02/2007   Asthma 07/02/2007    PCP: Lynwood Null, MD  REFERRING PROVIDER: Juliene Medicine, DPM; Lyle Decamp, PA-C  REFERRING DIAG:  [phone] (ICD-10-CM) - Peroneal tendinitis of right lower extremity   Z96.89 (ICD-10-CM) - S/P insertion of spinal cord stimulator   Rationale for Evaluation and Treatment: Rehabilitation  THERAPY DIAG:  Muscle weakness (generalized)  Unsteadiness on feet  Difficulty in walking, not elsewhere classified  Pain in right foot  Other low back pain  Chronic left shoulder pain  Scar tissue  ONSET DATE: Spinal cord stimulator placed on 01/24/24 Foot pain began around 6 months ago  SUBJECTIVE:                                                                                                                                                                                           SUBJECTIVE STATEMENT: Patient reports still having issues with stimulator and going to call rep for advice. Reports ongoing right foot pain.   PERTINENT HISTORY:  Pt is well-known to this clinic. Underwent placement for permanent spinal cord stimulator on 01/24/24 after a successful trial. Pt with a history of diagnosed chronic pain syndrome and failed back surgery syndrome. Pt has a history of plantar fasciitis and prior foot surgeries and was seen by Podiatrist on 6/11 and was given diagnosis of peroneal tendonitis. Per Podiatry note:  Insertional peroneal tendonitis. Has had some improvement with corticosteroid injection.  Recommended formal physical therapy.  Referral was sent.  Return in 6 weeks.  If no improvement recommend MRI Patient has noticed recent increase in hair loss,  will return to oncology physician next week to address concerns.   PMH includes anemia, anxiety, breast cancer of L breast, cervical cancer, chronic back pain, chronic hypotension, CKD (now clear), claustrophobia, decompression injury of spinal cord, depression, dizziness, failed spinal cord stimulator, hypercholesteremia, inappropriate sinus node tachycardia, migraine, morbid obesity, neurogenic orthostatic hypotension, OA of L hip, pneumonia, seizure, spinal cord stroke  See chart for full past surgical history  PAIN: Reporting on R foot pain: Are you having pain? Yes: NPRS scale: 3/10 currently, 7/10 at worst, 1/10 Pain location: On the top/lateral aspect of her R foot Pain description: throbbing, stabbing Aggravating factors: walking after about 40 minutes Relieving factors: getting off of it, elevating it  PRECAUTIONS: Back - until 6 weeks post-op (July 3rd, 2025)- pt able to recall precautions without cueing: no bending, lifting, twisting   RED FLAGS: None   WEIGHT BEARING RESTRICTIONS: No  FALLS:  Has patient fallen in last 6 months? Yes. 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  DIAGNOSTIC FINDINGS:  Right foot radiograph: enthesopathy at the fifth metatarsal base (12/31/2023)   PATIENT SURVEYS:  mODI: 36 / 50 = 72.0 %  FADI: 24/50-  48%  COGNITION: Overall cognitive status: Within functional limits for tasks assessed     SENSATION: WFL  MUSCLE LENGTH: Deferred at eval due to back precautions Hamstrings:  Thomas test:   POSTURE: rounded shoulders and forward head  PALPATION: Tender on palpation of base of 5th metatarsal and along lateral aspect of R foot  LUMBAR ROM: *Not tested at eval due to back precautions*  AROM eval  Flexion   Extension   Right lateral flexion   Left lateral flexion   Right rotation   Left rotation    (Blank rows = not tested)  LOWER EXTREMITY ROM:     Active  Right eval Left eval  Ankle dorsiflexion 4 7  Ankle plantarflexion 42 50  Ankle inversion 15 25  Ankle eversion 5 15   (Blank rows = not tested)  LOWER EXTREMITY MMT:    MMT Right eval Left eval  Hip flexion 4+ 4+  Hip extension    Hip abduction 4- 4-  Hip adduction 4+ 4+  Hip internal rotation    Hip external rotation    Knee flexion 4- 4-  Knee extension 3+ 4+  Ankle dorsiflexion 4- 4+  Ankle plantarflexion 4- 4+  Ankle inversion    Ankle eversion     (Blank rows = not tested)  LUMBAR SPECIAL TESTS:  Deferred due to back precautions  FUNCTIONAL TESTS:  5 times sit to stand: 24.80 seconds with UE use on armchair  10 meter walk test: 13.52seconds (0.74 m/s) FGA: 13/30 on 7/9  GAIT: Distance walked: approx. 18ft Assistive device utilized: None Level of assistance: Modified independence Comments: Pt demonstrated step-through pattern throughout with decreased stance time on R LE and mild Trendelenburg compensatory gait pattern on R. Antalgic gait present throughout with narrowed BOS  TREATMENT DATE: 03/19/24                                                                                                                               Physical Performance Measures: 6 Min Walk Test:  Instructed patient to ambulate as quickly and as safely as possible for 6 minutes using LRAD. Patient was allowed to  take standing rest breaks without stopping the test, but if the patient required a sitting rest break the clock would be stopped and the test would be over.  Results: 550 feet (168 meters, Avg speed 0.47 m/s) using no AD with SBA. Results indicate that the patient has reduced endurance with ambulation compared to age matched norms.  Age Matched Norms: 82-69 yo M: 51 F: 13, 64-79 yo M: 80 F: 471, 56-89 yo M: 417 F: 392 MDC: 58.21 meters (190.98 feet) or 50 meters (ANPTA Core Set of Outcome Measures for Adults with Neurologic Conditions, 2018)    THEREX:   Ankle strengthening-  -Resistive ankle DF- GTB 2 x 10  -Resistive ankle PF- GTB 2 x 10  -Resistive ankle EV-GTB 2 x 10  -Resistive ankle IV- GTB 2 x 10  Toe splay x 10 reps  Toe yoga (great toe up/4 toes press down) into Great toe down and 4 toes up) x 10  Towel scrunch x 10 reps   Self care- See below for ankle HEP (reviewed Video in Medbridge) and issued handout and reviewed pics.    PATIENT EDUCATION:  Education details: Education provided on examination findings and goals for PT POC. Person educated: Patient Education method: Explanation Education comprehension: verbalized understanding  HOME EXERCISE PROGRAM: Access Code: [email or web address] URL: [email or web address] Date: 03/19/2024 Prepared by: Reyes London  Exercises - Sidelying Ankle Eversion Strengthening with Ankle Weight  - 3 x weekly - 3 sets - 10 reps - Toe Yoga - Alternating Great Toe and Lesser Toe Extension  - 3 x weekly - 3 sets - 10 reps - Towel Scrunches  - 1 x daily - 3 x weekly - 3 sets - 10 reps - Toe Spreading  - 3 x weekly - 3 sets - 10 reps    Access Code: [email or web address]  URL: [email or web address]  Date: 03/12/2024  repared by: Marina  Moser  Exercises: - Seated Ankle Plantarflexion with Resistance  - 1 x daily - 7 x weekly - 2 sets - 10 reps - 5 hold  - Seated Ankle Eversion with Resistance  - 1 x daily - 7 x weekly - 2  sets - 10 reps - 5 hold  - Seated Ankle Inversion with Resistance and Legs Crossed  -  1 x daily - 7 x weekly - 2 sets - 10 reps - 5 hold  - Seated Ankle Dorsiflexion with Resistance  - 1 x daily - 7 x weekly - 2 sets - 10 reps - 5 hold  - Seated Ankle Alphabet  - 1 x daily - 7 x weekly - 2 sets - 10 reps - 5 hold - Supine Posterior Pelvic Tilt  - 1 x daily - 7 x weekly - 2 sets - 10 reps - 5 hold  - Supine Bridge with Pelvic Floor Contraction  - 1 x daily - 7 x weekly - 2 sets - 10 reps - 5 hold   ASSESSMENT:  CLINICAL IMPRESSION: Patient arrived to clinic with mild limping and reporting ongoing right foot pain and low back pain. She was able to complete 6 min walk (wearing tennis shoes today) and presents with impairment - well below normal values for age/sex. She was instructed in some ankle mobility and resistive therex with good ability today and no report of increased pain. She will benefit from review and progression next visit. Pt would benefit from skilled PT intervention to address listed deficits and improve strength, balance, weight bearing tolerance, and overall mobility status.   OBJECTIVE IMPAIRMENTS: Abnormal gait, decreased activity tolerance, decreased balance, decreased endurance, decreased mobility, difficulty walking, decreased ROM, decreased strength, hypomobility, impaired perceived functional ability, impaired flexibility, improper body mechanics, postural dysfunction, and pain.   ACTIVITY LIMITATIONS: carrying, lifting, bending, sitting, standing, squatting, stairs, transfers, bed mobility, bathing, toileting, hygiene/grooming, and locomotion level  PARTICIPATION LIMITATIONS: meal prep, cleaning, laundry, driving, shopping, community activity, and occupation  PERSONAL FACTORS: Age, Past/current experiences, and 3+ comorbidities: anemia, anxiety, breast cancer of L breast, cervical cancer, chronic back pain, chronic hypotension, CKD (now clear), claustrophobia, decompression  injury of spinal cord, depression, dizziness, failed spinal cord stimulator, hypercholesteremia, inappropriate sinus node tachycardia, migraine, morbid obesity, neurogenic orthostatic hypotension, OA of L hip, pneumonia, seizure, spinal cord stroke are also affecting patient's functional outcome.   REHAB POTENTIAL: Good  CLINICAL DECISION MAKING: Evolving/moderate complexity  EVALUATION COMPLEXITY: Moderate   GOALS: Goals reviewed with patient? Yes  SHORT TERM GOALS: Target date: March 18, 2024  1. Patient will be independent in home exercise program to improve strength/mobility for better functional independence with ADLs. Baseline: give next session  Goal status: INITIAL  LONG TERM GOALS: Target date: 04/22/2024   Pt will increase by at least 0.13 m/s in order to demonstrate clinically significant improvement in community ambulation.   Baseline: 0.74 without AD Goal status: INITIAL  2. Pt will decrease 5TSTS by at least 3 seconds in order to demonstrate clinically significant improvement in LE strength  Baseline: 24.8 seconds with UE use on armrests  Goal status: INITIAL  3.  Pt will decrease mODI scoreby at least 13 points in order demonstrate clinically significant reduction in pain/disability  Baseline: 36 / 50 = 72.0 % on 7/9 Goal status: INITIAL  4. Pt will increase by at least 77m (131ft) in order to demonstrate clinically significant improvement in cardiopulmonary endurance and community ambulation  Baseline: Assess next session Goal status: INITIAL   5.  Patient will increase Functional Gait Assessment score to >20/30 as to reduce fall risk and improve dynamic gait safety with community ambulation. Baseline: 13/30 on 7/9 Goal status: INITIAL  6. Patient will increase BLE gross strength to 4+/5 as to improve functional strength for independent gait, increased standing tolerance and increased ADL ability.  Baseline: see MMT  chart above  Goal status:  INITIAL  PLAN:  PT FREQUENCY: 2x/week  PT DURATION: 8 weeks  PLANNED INTERVENTIONS: [id number]- PT Re-evaluation, 97750- Physical Performance Testing, 97110-Therapeutic exercises, 97530- Therapeutic activity, [id number]- Neuromuscular re-education, 97535- Self Care, [id number]- Manual therapy, [id number]- Gait training, [phone]- Orthotic Initial, [phone]- Orthotic/Prosthetic subsequent, [phone]- Canalith repositioning, [id number]- Splinting, [id number]- Electrical stimulation (unattended), [phone]- Electrical stimulation (manual), [id number]- Ultrasound, [id number]- Traction (mechanical), [id number] (1-2 muscles), [id number] (3+ muscles)- Dry Needling, Patient/Family education, Balance training, Stair training, Taping, Joint mobilization, Joint manipulation, Spinal mobilization, Scar mobilization, Compression bandaging, Vestibular training, Visual/preceptual remediation/compensation, Cognitive remediation, DME instructions, Cryotherapy, Moist heat, and Biofeedback.  PLAN FOR NEXT SESSION: Review and progress of Ankle ROM/ strengthening functional LE strengthening dynamic balance training       Reyes LOISE London, PT 03/19/2024, 12:21 PM

## 2024-03-21 ENCOUNTER — Encounter: Attending: Physical Medicine and Rehabilitation | Admitting: Physical Medicine and Rehabilitation

## 2024-03-21 ENCOUNTER — Encounter: Payer: Self-pay | Admitting: Physical Medicine and Rehabilitation

## 2024-03-21 VITALS — BP 139/88 | HR 75 | Ht 64.0 in | Wt 265.0 lb

## 2024-03-21 DIAGNOSIS — G43709 Chronic migraine without aura, not intractable, without status migrainosus: Secondary | ICD-10-CM | POA: Diagnosis not present

## 2024-03-21 DIAGNOSIS — G8929 Other chronic pain: Secondary | ICD-10-CM | POA: Insufficient documentation

## 2024-03-21 DIAGNOSIS — Z79899 Other long term (current) drug therapy: Secondary | ICD-10-CM | POA: Insufficient documentation

## 2024-03-21 DIAGNOSIS — N182 Chronic kidney disease, stage 2 (mild): Secondary | ICD-10-CM | POA: Diagnosis not present

## 2024-03-21 DIAGNOSIS — G8222 Paraplegia, incomplete: Secondary | ICD-10-CM | POA: Insufficient documentation

## 2024-03-21 DIAGNOSIS — Z6841 Body Mass Index (BMI) 40.0 and over, adult: Secondary | ICD-10-CM | POA: Insufficient documentation

## 2024-03-21 DIAGNOSIS — M5442 Lumbago with sciatica, left side: Secondary | ICD-10-CM | POA: Insufficient documentation

## 2024-03-21 DIAGNOSIS — S34109S Unspecified injury to unspecified level of lumbar spinal cord, sequela: Secondary | ICD-10-CM | POA: Diagnosis present

## 2024-03-21 DIAGNOSIS — E876 Hypokalemia: Secondary | ICD-10-CM | POA: Insufficient documentation

## 2024-03-21 DIAGNOSIS — X58XXXS Exposure to other specified factors, sequela: Secondary | ICD-10-CM | POA: Insufficient documentation

## 2024-03-21 DIAGNOSIS — Z9884 Bariatric surgery status: Secondary | ICD-10-CM | POA: Diagnosis not present

## 2024-03-21 DIAGNOSIS — Z9689 Presence of other specified functional implants: Secondary | ICD-10-CM | POA: Diagnosis present

## 2024-03-21 DIAGNOSIS — M5441 Lumbago with sciatica, right side: Secondary | ICD-10-CM | POA: Insufficient documentation

## 2024-03-21 NOTE — Patient Instructions (Signed)
 Pt is a 53 yr old female with paraparesis/incomplete paraplegia due to attempted spinal cord stimulator placement- With chronic hx of orthostasis, BMI of 41 - back dwon from 45-  s/p gastric sleeve, Acute on chronic renal issues- CKD stage II,  here for f/u on SCI and neurogenic orthostatic hypotension. Vestibular issues due to trigger points and central issues.  Also had surgery on back in December 2023- L2-L3 laminectomy- at [facility]- Dr Royden Schneider- Spine and Scoliosis- in [place], is Ortho spine per pt.  Here for f/u on ASIA D incomplete paraplegia and chronic pain.   Has a new [facility] stimulator- placed 5/25  Low potassium of 3.3 form last labs- suggest Orange juice- or oranges- which will help build up her potassium- she doesn't like bananas.   2.  Con't Topamax - 100 mg nightly- will send in refills next visit-    3. Suggest trying Methocarbamol   2 tabs of 500 mg - at least 1.5 of medicine and see if that makes a difference.  If you need more, let me know.    4. Can try Magnesium   400  mg daily x 1 week- and then if no loose stools, can increase to 400 mg 2x/day- Magnesium  Glycinate supposedly has less loose stools.  But can also get the cheapest at drug store and try that.  Has to take regularly to make a difference.   5.  We went over [facility] stimulator- and how it works   6.  Con't Baclofen  10 mg 3x/day- -has refills   7. Con't Duloxetine  120 mg daily- has refills.    8. F/U in 3 months- double appt- SCI

## 2024-03-21 NOTE — Progress Notes (Signed)
 Subjective:    Patient ID: INDEA DEARMAN, female    DOB: 08-01-1971, 53 y.o.   MRN: [id number]  HPI Pt is a 53 yr old female with paraparesis/incomplete paraplegia due to attempted spinal cord stimulator placement- With chronic hx of orthostasis, BMI of 41 - back dwon from 44-  s/p gastric sleeve, Acute on chronic renal issues- CKD stage II,  here for f/u on SCI and neurogenic orthostatic hypotension. Vestibular issues due to trigger points and central issues.  Also had surgery on back in December 2023- L2-L3 laminectomy- at [facility]- Dr Royden Schneider- Spine and Scoliosis- in [place], is Ortho spine per pt.  Here for f/u on ASIA D incomplete paraplegia and chronic pain.   Here for trigger point injections as well.   Feeling so-so.   Got a cat as my recommendation. He's 4 months old.  Is attacking her legs- and scratching them and climb her legs.   Much happier now that got new baby,   Gaining weight- going to check her thyroid  next week- took off Letrazole- her cancer meds for 3 months- since losing massive amounts of hair.  Comes out in handfuls. Thinks it's from cancer medicine.   Checking Thyroid  due to gaining weight and losing hair.   Has UTI in June- now OK.    Got Spinal cord stimulator in- still making adjustments to get it to work the best- different zones- x2- still has areas to figure out- helping- yes and no- some areas are helpful- has taken numbness and tingling away-  have different areas that are hurting more.  Put paddles in this time.     Back Spasms are really high- still taking baclofen   Major back spasms- because they cut more muscles in back.  Will stop her dead in her tracks.   Brooke PA gave her Robaxin -  helps some- takes when has spasms.  Still taking baclofen  10 mg TID- Also has Mobic  7.5 mg daily- just for pain.   Pain Inventory Average Pain 5 Pain Right Now 5 My pain is aching  In the last 24 hours, has pain interfered with the  following? General activity 5 Relation with others 5 Enjoyment of life 5 What TIME of day is your pain at its worst? evening and night Sleep (in general) Poor  Pain is worse with: walking, bending, sitting, standing, and some activites Pain improves with: medication Relief from Meds: 7             Family History  Problem Relation Age of Onset   Renal Disease Mother    Hypertension Mother    Sudden Cardiac Death Mother    Heart failure Mother    Valvular heart disease Mother    Heart disease Mother    Heart disease Father        Just had stents placed   Stroke Brother    Heart attack Brother 22   Breast cancer Maternal Grandmother    Diabetes Other    Social History   Socioeconomic History   Marital status: Divorced    Spouse name: Not on file   Number of children: 1   Years of education: Not on file   Highest education level: Not on file  Occupational History   Not on file  Tobacco Use   Smoking status: Never    Passive exposure: Never   Smokeless tobacco: Never  Vaping Use   Vaping status: Never Used  Substance and Sexual Activity   Alcohol use:  Not Currently    Comment: occasional wine   Drug use: No   Sexual activity: Not Currently    Birth control/protection: Surgical    Comment: Hyst  Other Topics Concern   Not on file  Social History Narrative   Right handed    Uses cane to walk    Son has Medical and legal POA.   Social Drivers of Corporate investment banker Strain: Low Risk  (10/31/2023)   Received from [facility]   Overall Financial Resource Strain (CARDIA)    Difficulty of Paying Living Expenses: Not very hard  Food Insecurity: No Food Insecurity (10/31/2023)   Received from [facility]   Hunger Vital Sign    Within the past 12 months, you worried that your food would run out before you got the money to buy more.: Never true    Within the past 12 months, the food you bought just didn't last and  you didn't have money to get more.: Never true  Transportation Needs: No Transportation Needs (10/31/2023)   Received from [facility] - Transportation    In the past 12 months, has lack of transportation kept you from medical appointments or from getting medications?: No    Lack of Transportation (Non-Medical): No  Physical Activity: Inactive (12/11/2022)   Exercise Vital Sign    Days of Exercise per Week: 0 days    Minutes of Exercise per Session: 0 min  Stress: Stress Concern Present (12/11/2022)   [employer] of Occupational Health - Occupational Stress Questionnaire    Feeling of Stress : To some extent  Social Connections: Moderately Isolated (12/11/2022)   Social Connection and Isolation Panel    Frequency of Communication with Friends and Family: Three times a week    Frequency of Social Gatherings with Friends and Family: Twice a week    Attends Religious Services: 1 to 4 times per year    Active Member of [employer] or Organizations: No    Attends Engineer, structural: Never    Marital Status: Divorced   Past Surgical History:  Procedure Laterality Date   ABDOMINAL HYSTERECTOMY     APPENDECTOMY     BACK SURGERY     BILIOPANCREATIC DIVISION W DUODENAL SWITCH N/A    BREAST BIOPSY Left 12/06/2022   u/s bx,1:00 heart clip, path pending   BREAST BIOPSY Left 12/06/2022   us  bx 2:00 ribbon clip path pending   BREAST BIOPSY Left 12/06/2022   US  LT BREAST BX W LOC DEV EA ADD LESION IMG BX SPEC US  GUIDE 12/06/2022 [facility]   BREAST BIOPSY Left 12/06/2022   US  LT BREAST BX W LOC DEV 1ST LESION IMG BX SPEC US  GUIDE 12/06/2022 [facility]   BREAST RECONSTRUCTION WITH PLACEMENT OF TISSUE EXPANDER AND FLEX HD (ACELLULAR HYDRATED DERMIS) Bilateral 01/18/2023   Procedure: IMMEDIATE LEFT BREAST RECONSTRUCTION WITH PLACEMENT OF TISSUE EXPANDER AND FLEX HD (ACELLULAR HYDRATED DERMIS);  Surgeon: [name], DO;  Location: [facility];  Service:  Plastics;  Laterality: Bilateral;   BREAST RECONSTRUCTION WITH PLACEMENT OF TISSUE EXPANDER AND FLEX HD (ACELLULAR HYDRATED DERMIS) Left 03/26/2023   Procedure: BREAST RECONSTRUCTION WITH PLACEMENT OF TISSUE EXPANDER;  Surgeon: [name], DO;  Location: [facility];  Service: Plastics;  Laterality: Left;   CHOLECYSTECTOMY     COLONOSCOPY N/A 06/27/2021   Procedure: COLONOSCOPY;  Surgeon: Onita Elspeth Sharper, DO;  Location: [facility] ENDOSCOPY;  Service: Gastroenterology;  Laterality:  N/A;   COLONOSCOPY N/A 08/27/2023   Procedure: COLONOSCOPY;  Surgeon: Onita Elspeth Sharper, DO;  Location: [facility] END[facility]COPY;  Service: Gastroenterology;  Laterality: N/A;   DIAGNOSTIC LAPAROSCOPY     LOA   ESOPHAGOGASTRODUODENOSCOPY N/A 06/27/2021   Procedure: ESOPHAGOGASTRODUODENOSCOPY (EGD);  Surgeon: Onita Elspeth Sharper, DO;  Location: [facility] ENDOSCOPY;  Service: Gastroenterology;  Laterality: N/A;   ESOPHAGOGASTRODUODENOSCOPY (EGD) WITH PROPOFOL  N/A 07/25/2017   Procedure: ESOPHAGOGASTRODUODENOSCOPY (EGD) WITH PROPOFOL ;  Surgeon: Viktoria Lamar DASEN, MD;  Location: [facility] ENDOSCOPY;  Service: Endoscopy;  Laterality: N/A;   HERNIA REPAIR     KNEE ARTHROSCOPY Right    LAPAROSCOPIC GASTRIC SLEEVE RESECTION WITH HIATAL HERNIA REPAIR     LUMBAR FUSION  08/29/2022   Revision Lumbar two through five Laminectomy & Fusion with Transforaminal Lumbar interbody fusion   MASTECTOMY W/ SENTINEL NODE BIOPSY Bilateral 01/18/2023   Procedure: MASTECTOMY WITH SENTINEL LYMPH NODE BIOPSY, RNFA to assist;  Surgeon: Jordis Laneta FALCON, MD;  Location: [facility];  Service: General;  Laterality: Bilateral;   PULSE GENERATOR IMPLANT N/A 01/24/2024   Procedure: UNILATERAL PULSE GENERATOR IMPLANT;  Surgeon: [name] ORN, MD;  Location: [facility];  Service: Neurosurgery;  Laterality: N/A;  INTERNAL PULSE GENERATOR PLACEMENT FOR SPINAL CORD STIMULATOR   REMOVAL OF TISSUE EXPANDER AND PLACEMENT OF IMPLANT Bilateral 06/06/2023    Procedure: REMOVAL OF TISSUE EXPANDER AND PLACEMENT OF IMPLANT;  Surgeon: [name], DO;  Location: [facility];  Service: Plastics;  Laterality: Bilateral;   SPINAL CORD STIMULATOR IMPLANT     THORACIC LAMINECTOMY FOR SPINAL CORD STIMULATOR N/A 01/15/2024   Procedure: THORACIC LAMINECTOMY FOR SPINAL CORD STIMULATOR;  Surgeon: [name] ORN, MD;  Location: [facility];  Service: Neurosurgery;  Laterality: N/A;  THORACIC LAMINECTOMY FOR SPINAL CORD STIMULATOR PADDLE TRIAL   TONSILLECTOMY     TOTAL HIP ARTHROPLASTY Left 01/26/2016   Procedure: LEFT TOTAL HIP ARTHROPLASTY ANTERIOR APPROACH;  Surgeon: Kay CHRISTELLA Cummins, MD;  Location: [facility];  Service: Orthopedics;  Laterality: Left;   TRANSFORAMINAL LUMBAR INTERBODY FUSION (TLIF) WITH PEDICLE SCREW FIXATION 3 LEVEL  08/2019   Lynwood Better, MD L2-L5   Past Medical History:  Diagnosis Date   Acute nontraumatic kidney injury (HCC)    Anemia    Anxiety    Benign cyst of kidney    Breast cancer (HCC) 01/18/2023   left breast   Cervical cancer (HCC)    Chronic back pain    Chronic hypotension    Chronic kidney disease    Previous now clear   Claustrophobia    Decompression injury of spinal cord    Depression    Dizziness    Failed spinal cord stimulator (HCC)    Family history of adverse reaction to anesthesia      my mother takes a long time time wake up   History of dysplastic nevus 01/18/2018   right shoulder, recurrent dysplastic nevus   History of kidney stones    History of shingles    Hypercholesteremia    Inappropriate sinus node tachycardia (HCC)    Migraine    Morbid obesity (HCC)    Multiple allergies    Neurogenic orthostatic hypotension (HCC)    Obesity    Osteoarthritis    left hip   Pneumonia    PONV (postoperative nausea and vomiting)    delayed emergence   Seizure (HCC) 2021   x1-after a back surgery with spinal cord stimulator   Slow to wake up after anesthesia  Stroke (HCC) 08/11/2020    spinal cord stroke, spinal cord puncture during surgery   Wears glasses    BP 139/88 (BP Location: Left Arm, Patient Position: Sitting, Cuff Size: Large)   Pulse 75   Ht 5' 4 (1.626 m)   Wt 265 lb (120.2 kg)   SpO2 99%   BMI 45.49 kg/m   Opioid Risk Score:   Fall Risk Score:  `1  Depression screen [facility] 2/9     03/06/2024   10:14 AM 08/22/2023    9:03 AM 05/21/2023   11:08 AM 12/08/2022   11:28 AM 11/13/2022    9:46 AM 11/13/2022    9:42 AM 02/22/2022    9:48 AM  Depression screen [facility] 2/9  Decreased Interest 0 0 0 2 0 0 0  Down, Depressed, Hopeless 0 0 0 1 0 0 0  PHQ - 2 Score 0 0 0 3 0 0 0     Review of Systems    An entire ROS was completed and negative except for HPI Objective:   Physical Exam  Awake, alert, appropriate, NAD- has gained some weight Climbing from cat marks- scabs and scars on RLE up into mid-upper thigh [place] stimulator- scars that are healing- batter pack L paraspinals of lumbar spine and mid back incision under bra.  No assistive device     Assessment & Plan:  Pt is a 53 yr old female with paraparesis/incomplete paraplegia due to attempted spinal cord stimulator placement- With chronic hx of orthostasis, BMI of 41 - back dwon from 44-  s/p gastric sleeve, Acute on chronic renal issues- CKD stage II,  here for f/u on SCI and neurogenic orthostatic hypotension. Vestibular issues due to trigger points and central issues.  Also had surgery on back in December 2023- L2-L3 laminectomy- at [facility]- Dr Royden Schneider- Spine and Scoliosis- in [place], is Ortho spine per pt.  Here for f/u on ASIA D incomplete paraplegia and chronic pain.  [place] stimulator placed 01/2024  Low potassium of 3.3 form last labs- suggest Orange juice- or oranges- which will help build up her potassium- she doesn't like bananas.   2.  Con't Topamax - 100 mg nightly- will send in refills next visit-    3. Suggest trying Methocarbamol   2 tabs of 500 mg - at least 1.5 of medicine and see if  that makes a difference.  If you need more, let me know.    4. Can try Magnesium   400  mg daily x 1 week- and then if no loose stools, can increase to 400 mg 2x/day- Magnesium  Glycinate supposedly has less loose stools.  But can also get the cheapest at drug store and try that.  Has to take regularly to make a difference.   5.  We went over [facility] stimulator- and how it works   6.  Con't Baclofen  10 mg 3x/day- -has refills   7. Con't Duloxetine  120 mg daily- has refills.    8. F/U in 3 months- double appt- SCI   I spent a total of 26   minutes on total care today- >50% coordination of care- due to d/w pt about [place] stimulator-, her pain control  muscle spasms and and how to take meds- change the dosage.

## 2024-03-25 ENCOUNTER — Ambulatory Visit (INDEPENDENT_AMBULATORY_CARE_PROVIDER_SITE_OTHER): Admitting: Plastic Surgery

## 2024-03-25 VITALS — BP 145/82 | HR 75 | Ht 64.0 in | Wt 267.2 lb

## 2024-03-25 DIAGNOSIS — C50812 Malignant neoplasm of overlapping sites of left female breast: Secondary | ICD-10-CM | POA: Diagnosis not present

## 2024-03-25 DIAGNOSIS — Z170 Estrogen receptor positive status [ER+]: Secondary | ICD-10-CM

## 2024-03-25 DIAGNOSIS — N651 Disproportion of reconstructed breast: Secondary | ICD-10-CM

## 2024-03-25 NOTE — Progress Notes (Signed)
   Subjective:    Patient ID: Vanessa Medina, female    DOB: [birth year], 53 y.o.   MRN: [id number]  The patient is a 53 year old female here for follow-up on her breast surgery.  She underwent bilateral mastectomies with expanders.  She then had implants placed in October 2024.  She has [place] smooth round ultra high-profile gel 650 cc implants in place.  She is noticing a little asymmetry and rippling.  She would like to see if she can have fat grafting for better form and symmetry.      Review of Systems  Constitutional: Negative.   HENT: Negative.    Eyes: Negative.   Respiratory: Negative.    Cardiovascular: Negative.   Gastrointestinal: Negative.   Endocrine: Negative.   Genitourinary: Negative.        Objective:   Physical Exam Vitals reviewed.  Constitutional:      Appearance: Normal appearance.  HENT:     Head: Atraumatic.  Cardiovascular:     Rate and Rhythm: Normal rate.     Pulses: Normal pulses.  Pulmonary:     Effort: Pulmonary effort is normal.  Abdominal:     Palpations: Abdomen is soft.  Skin:    General: Skin is warm.     Capillary Refill: Capillary refill takes less than 2 seconds.  Neurological:     Mental Status: She is alert and oriented to person, place, and time.  Psychiatric:        Mood and Affect: Mood normal.        Behavior: Behavior normal.        Thought Content: Thought content normal.        Judgment: Judgment normal.         Assessment & Plan:     ICD-10-CM   1. Malignant neoplasm of overlapping sites of left breast in female, estrogen receptor positive (HCC)  C50.812    Z17.0     2. Breast asymmetry following reconstructive surgery  N65.1       Plan for bilateral fat grafting for improved symmetry.  Pictures were obtained of the patient and placed in the chart with the patient's or guardian's permission.

## 2024-03-26 ENCOUNTER — Ambulatory Visit: Admitting: Podiatry

## 2024-03-26 ENCOUNTER — Ambulatory Visit

## 2024-03-26 VITALS — Ht 64.0 in | Wt 267.2 lb

## 2024-03-26 DIAGNOSIS — M7671 Peroneal tendinitis, right leg: Secondary | ICD-10-CM | POA: Diagnosis not present

## 2024-03-26 NOTE — Progress Notes (Signed)
  Subjective:  Patient ID: Vanessa Medina, female    DOB: 07/18/1971,  MRN: [id number]  Chief Complaint  Patient presents with   Tendonitis    Rm 2 6 wk f/u peroneal tendinitis right foot. Patient states continued pain and soreness on the lateral side of the right foot. Patient is currently using a brace and going to PT with no relief.    Discussed the use of AI scribe software for clinical note transcription with the patient, who gave verbal consent to proceed.  History of Present Illness Vanessa Medina is a 53 year old female with a history of plantar fasciitis and prior foot surgeries who returns for follow-up with right foot pain.  Feels like the pain has kind of gone back to where it was.  PT has not been helpful as much as it was initially.      Objective:    Physical Exam VASCULAR: DP and PT pulse palpable. Foot is warm and well-perfused. Capillary fill time is brisk. DERMATOLOGIC: Normal skin turgor, texture, and temperature. No open lesions, rashes, or ulcerations. NEUROLOGIC: Normal sensation to light touch and pressure. No paresthesias on examination. ORTHOPEDIC: Pain on palpation at the fifth metatarsal base, peroneus brevis insertion, right foot, slightly improved. Pain with resisted eversion, right foot.  Pain at the fifth metatarsal base.  No pain on lateral plantar fascia   No images are attached to the encounter.    Results    RADIOLOGY Right foot radiograph: enthesopathy at the fifth metatarsal base (12/31/2023)   Assessment:   1. Peroneal tendinitis of right lower extremity       Plan:  Patient was evaluated and treated and all questions answered.  Assessment and Plan Assessment & Plan Insertional peroneal tendonitis Unfortunately has regressed back to where she initially started.  Would not recommend further corticosteroid injection at this point without further imaging to evaluate for the possibility of peroneal tendon tear.  Referral  placed for this and I recommended rest in a cam walker boot.  This was dispensed today to facilitate immobilization and soft tissue rest and healing.  Return in 1 month to reevaluate after MRI      Return in about 1 month (around 04/26/2024) for re-check peroneal tendinitis, after MRI to review.

## 2024-03-26 NOTE — Patient Instructions (Signed)
 Call [facility] to schedule your MRI at the below locations.  Please allow at least 1 business day after your visit to process the referral.  It may take longer depending on approval from insurance.  Please let me know if you have issues or problems scheduling the MRI   [facility] [place] [phone] [address] [place] [address] [place], [place] [address]  [facility] [phone] [address] [place], [place] [address]

## 2024-04-02 ENCOUNTER — Ambulatory Visit

## 2024-04-02 DIAGNOSIS — M79671 Pain in right foot: Secondary | ICD-10-CM

## 2024-04-02 DIAGNOSIS — M25512 Pain in left shoulder: Secondary | ICD-10-CM

## 2024-04-02 DIAGNOSIS — R2681 Unsteadiness on feet: Secondary | ICD-10-CM

## 2024-04-02 DIAGNOSIS — M5459 Other low back pain: Secondary | ICD-10-CM

## 2024-04-02 DIAGNOSIS — R262 Difficulty in walking, not elsewhere classified: Secondary | ICD-10-CM

## 2024-04-02 DIAGNOSIS — M6281 Muscle weakness (generalized): Secondary | ICD-10-CM

## 2024-04-02 NOTE — Therapy (Signed)
 OUTPATIENT PHYSICAL THERAPY THORACOLUMBAR/ LOWER EXTREMITY TREATMENT   Patient Name: Vanessa Medina MRN: [id number] DOB:06-Jul-1971, 53 y.o., female Today's Date: 04/02/2024  END OF SESSION:  PT End of Session - 04/02/24 1021     Visit Number 4    Number of Visits 16    Date for PT Re-Evaluation 04/22/24    Authorization Type 27 allowed visits    Progress Note Due on Visit 10    PT Start Time 1017    PT Stop Time 1059    PT Time Calculation (min) 42 min    Equipment Utilized During Treatment Gait belt    Activity Tolerance Patient tolerated treatment well;Treatment limited secondary to medical complications (Comment)    Behavior During Therapy WFL for tasks assessed/performed           Past Medical History:  Diagnosis Date   Acute nontraumatic kidney injury (HCC)    Anemia    Anxiety    Benign cyst of kidney    Breast cancer (HCC) 01/18/2023   left breast   Cervical cancer (HCC)    Chronic back pain    Chronic hypotension    Chronic kidney disease    Previous now clear   Claustrophobia    Decompression injury of spinal cord    Depression    Dizziness    Failed spinal cord stimulator (HCC)    Family history of adverse reaction to anesthesia      my mother takes a long time time wake up   History of dysplastic nevus 01/18/2018   right shoulder, recurrent dysplastic nevus   History of kidney stones    History of shingles    Hypercholesteremia    Inappropriate sinus node tachycardia (HCC)    Migraine    Morbid obesity (HCC)    Multiple allergies    Neurogenic orthostatic hypotension (HCC)    Obesity    Osteoarthritis    left hip   Pneumonia    PONV (postoperative nausea and vomiting)    delayed emergence   Seizure (HCC) 2021   x1-after a back surgery with spinal cord stimulator   Slow to wake up after anesthesia    Stroke (HCC) 08/11/2020   spinal cord stroke, spinal cord puncture during surgery   Wears glasses    Past Surgical History:   Procedure Laterality Date   ABDOMINAL HYSTERECTOMY     APPENDECTOMY     BACK SURGERY     BILIOPANCREATIC DIVISION W DUODENAL SWITCH N/A    BREAST BIOPSY Left 12/06/2022   u/s bx,1:00 heart clip, path pending   BREAST BIOPSY Left 12/06/2022   us  bx 2:00 ribbon clip path pending   BREAST BIOPSY Left 12/06/2022   US  LT BREAST BX W LOC DEV EA ADD LESION IMG BX SPEC US  GUIDE 12/06/2022 [facility]   BREAST BIOPSY Left 12/06/2022   US  LT BREAST BX W LOC DEV 1ST LESION IMG BX SPEC US  GUIDE 12/06/2022 [facility]   BREAST RECONSTRUCTION WITH PLACEMENT OF TISSUE EXPANDER AND FLEX HD (ACELLULAR HYDRATED DERMIS) Bilateral 01/18/2023   Procedure: IMMEDIATE LEFT BREAST RECONSTRUCTION WITH PLACEMENT OF TISSUE EXPANDER AND FLEX HD (ACELLULAR HYDRATED DERMIS);  Surgeon: [name], DO;  Location: [facility];  Service: Plastics;  Laterality: Bilateral;   BREAST RECONSTRUCTION WITH PLACEMENT OF TISSUE EXPANDER AND FLEX HD (ACELLULAR HYDRATED DERMIS) Left 03/26/2023   Procedure: BREAST RECONSTRUCTION WITH PLACEMENT OF TISSUE EXPANDER;  Surgeon: [name], DO;  Location:  S[facility]ERY C[facility];  Service: Plastics;  Laterality: Left;   CHOLECYSTECTOMY     COLONOSCOPY N/A 06/27/2021   Procedure: COLONOSCOPY;  Surgeon: Onita Elspeth Sharper, DO;  Location: [facility] ENDOSCOPY;  Service: Gastroenterology;  Laterality: N/A;   COLONOSCOPY N/A 08/27/2023   Procedure: COLONOSCOPY;  Surgeon: Onita Elspeth Sharper, DO;  Location: [facility] ENDOSCOPY;  Service: Gastroenterology;  Laterality: N/A;   DIAGNOSTIC LAPAROSCOPY     LOA   ESOPHAGOGASTRODUODENOSCOPY N/A 06/27/2021   Procedure: ESOPHAGOGASTRODUODENOSCOPY (EGD);  Surgeon: Onita Elspeth Sharper, DO;  Location: [facility] ENDOSCOPY;  Service: Gastroenterology;  Laterality: N/A;   ESOPHAGOGASTRODUODENOSCOPY (EGD) WITH PROPOFOL  N/A 07/25/2017   Procedure: ESOPHAGOGASTRODUODENOSCOPY (EGD) WITH PROPOFOL ;  Surgeon: Viktoria Lamar DASEN, MD;  Location: [facility]  ENDOSCOPY;  Service: Endoscopy;  Laterality: N/A;   HERNIA REPAIR     KNEE ARTHROSCOPY Right    LAPAROSCOPIC GASTRIC SLEEVE RESECTION WITH HIATAL HERNIA REPAIR     LUMBAR FUSION  08/29/2022   Revision Lumbar two through five Laminectomy & Fusion with Transforaminal Lumbar interbody fusion   MASTECTOMY W/ SENTINEL NODE BIOPSY Bilateral 01/18/2023   Procedure: MASTECTOMY WITH SENTINEL LYMPH NODE BIOPSY, RNFA to assist;  Surgeon: Jordis Laneta FALCON, MD;  Location: [facility];  Service: General;  Laterality: Bilateral;   PULSE GENERATOR IMPLANT N/A 01/24/2024   Procedure: UNILATERAL PULSE GENERATOR IMPLANT;  Surgeon: [name] ORN, MD;  Location: [facility];  Service: Neurosurgery;  Laterality: N/A;  INTERNAL PULSE GENERATOR PLACEMENT FOR SPINAL CORD STIMULATOR   REMOVAL OF TISSUE EXPANDER AND PLACEMENT OF IMPLANT Bilateral 06/06/2023   Procedure: REMOVAL OF TISSUE EXPANDER AND PLACEMENT OF IMPLANT;  Surgeon: [name], DO;  Location: [facility];  Service: Plastics;  Laterality: Bilateral;   SPINAL CORD STIMULATOR IMPLANT     THORACIC LAMINECTOMY FOR SPINAL CORD STIMULATOR N/A 01/15/2024   Procedure: THORACIC LAMINECTOMY FOR SPINAL CORD STIMULATOR;  Surgeon: [name] ORN, MD;  Location: [facility];  Service: Neurosurgery;  Laterality: N/A;  THORACIC LAMINECTOMY FOR SPINAL CORD STIMULATOR PADDLE TRIAL   TONSILLECTOMY     TOTAL HIP ARTHROPLASTY Left 01/26/2016   Procedure: LEFT TOTAL HIP ARTHROPLASTY ANTERIOR APPROACH;  Surgeon: Kay CHRISTELLA Cummins, MD;  Location: [facility];  Service: Orthopedics;  Laterality: Left;   TRANSFORAMINAL LUMBAR INTERBODY FUSION (TLIF) WITH PEDICLE SCREW FIXATION 3 LEVEL  08/2019   Lynwood Better, MD L2-L5   Patient Active Problem List   Diagnosis Date Noted   Breast asymmetry following reconstructive surgery 03/25/2024   S/P insertion of spinal cord stimulator 03/21/2024   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   Pain and swelling of lower leg 09/25/2023    Lymphedema 09/25/2023   Long term (current) use of aromatase inhibitors 09/07/2023   At risk for loss of bone density 09/07/2023   Changing skin lesion 09/04/2023   Frequent falls 08/22/2023   Chronic pain syndrome 08/22/2023   Bilateral lower extremity edema 08/06/2023   Back pain 08/06/2023   Claustrophobia    Pernicious anemia 03/14/2023   Ruptured left breast implant 02/13/2023   S/P mastectomy, bilateral 01/18/2023   Genetic testing 12/18/2022   Breast cancer (HCC) 12/08/2022   Family history of breast cancer 12/08/2022   Profound fatigue 12/08/2022   Lumbar post-laminectomy syndrome 06/02/2022   Pseudarthrosis after fusion or arthrodesis 05/19/2022   Primary osteoarthritis of right knee 03/28/2022   Subjective tinnitus of both ears 12/21/2021   Abnormality of gait 04/29/2021   Abnormal sensation in both ears 04/06/2021   Other adverse food reactions, not elsewhere classified, subsequent encounter 04/06/2021   Multiple drug  allergies 04/06/2021   Insomnia due to medical condition 02/25/2021   Myofascial muscle pain 02/25/2021   Nerve pain 10/18/2020   Incomplete paraplegia (HCC) 10/18/2020   Orthostatic hypotension 10/18/2020   Renal cyst 09/23/2020   Chronic migraine without aura without status migrainosus, not intractable    Hypokalemia    Adjustment reaction with anxiety and depression    Spinal cord injury, lumbar, without spinal bone injury, sequela (HCC) 08/18/2020   Paraparesis (HCC)    Post-operative pain    Hypotension    Slow transit constipation    AKI (acute kidney injury) (HCC)    Right leg weakness 08/11/2020   DDD (degenerative disc disease), lumbar 09/02/2019    Class: Chronic   Degenerative disc disease, lumbar 09/02/2019   Chest tightness 09/23/2018   Bradycardia 09/23/2018   BMI 40.0-44.9, adult (HCC) 04/17/2018   Status post bariatric surgery 04/17/2018   Labile blood pressure 03/08/2018   Chronic low back pain 09/03/2017   History of total  hip replacement, left 01/26/2016   Osteoarthritis of spine with radiculopathy, lumbar region 10/16/2014   LEG PAIN, BILATERAL 12/16/2007   Malignant neoplasm of cervix uteri (HCC) 07/02/2007   MORBID OBESITY 07/02/2007   DEPRESSION 07/02/2007   Migraine without aura 07/02/2007   Other allergic rhinitis 07/02/2007   Asthma 07/02/2007   GERD 07/02/2007   ELEVATED BLOOD PRESSURE WITHOUT DIAGNOSIS OF HYPERTENSION 07/02/2007   Asthma 07/02/2007    PCP: Lynwood Null, MD  REFERRING PROVIDER: Juliene Medicine, DPM; Lyle Decamp, PA-C  REFERRING DIAG:  [phone] (ICD-10-CM) - Peroneal tendinitis of right lower extremity   Z96.89 (ICD-10-CM) - S/P insertion of spinal cord stimulator   Rationale for Evaluation and Treatment: Rehabilitation  THERAPY DIAG:  Muscle weakness (generalized)  Unsteadiness on feet  Difficulty in walking, not elsewhere classified  Other low back pain  Pain in right foot  Left shoulder pain, unspecified chronicity  ONSET DATE: Spinal cord stimulator placed on 01/24/24 Foot pain began around 6 months ago  SUBJECTIVE:                                                                                                                                                                                           SUBJECTIVE STATEMENT: Patient reports went to Podiatrist and states he wants her to have more imaging- possible peroneal tear and put her in walking boot. She states stimulator working fine for the moment. Agreeable to switch gears and focus some more on her low back.   PERTINENT HISTORY:  Pt is well-known to this clinic. Underwent placement for permanent spinal cord stimulator on 01/24/24 after a successful trial. Pt with  a history of diagnosed chronic pain syndrome and failed back surgery syndrome. Pt has a history of plantar fasciitis and prior foot surgeries and was seen by Podiatrist on 6/11 and was given diagnosis of peroneal tendonitis. Per Podiatry note:  Insertional peroneal tendonitis. Has had some improvement with corticosteroid injection.  Recommended formal physical therapy.  Referral was sent.  Return in 6 weeks.  If no improvement recommend MRI Patient has noticed recent increase in hair loss, will return to oncology physician next week to address concerns.   PMH includes anemia, anxiety, breast cancer of L breast, cervical cancer, chronic back pain, chronic hypotension, CKD (now clear), claustrophobia, decompression injury of spinal cord, depression, dizziness, failed spinal cord stimulator, hypercholesteremia, inappropriate sinus node tachycardia, migraine, morbid obesity, neurogenic orthostatic hypotension, OA of L hip, pneumonia, seizure, spinal cord stroke  See chart for full past surgical history  PAIN: Reporting on R foot pain: Are you having pain? Yes: NPRS scale: 3/10 currently, 7/10 at worst, 1/10 Pain location: On the top/lateral aspect of her R foot Pain description: throbbing, stabbing Aggravating factors: walking after about 40 minutes Relieving factors: getting off of it, elevating it  PRECAUTIONS: Back - until 6 weeks post-op (July 3rd, 2025)- pt able to recall precautions without cueing: no bending, lifting, twisting   RED FLAGS: None   WEIGHT BEARING RESTRICTIONS: No  FALLS:  Has patient fallen in last 6 months? Yes. 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  DIAGNOSTIC FINDINGS:  Right foot radiograph: enthesopathy at the fifth metatarsal base (12/31/2023)   PATIENT SURVEYS:  mODI: 36 / 50 = 72.0 %  FADI: 24/50- 48%  COGNITION: Overall cognitive status: Within functional limits for tasks assessed     SENSATION: WFL  MUSCLE LENGTH: Deferred at eval due to back precautions Hamstrings:  Thomas test:   POSTURE: rounded shoulders and forward head  PALPATION: Tender on palpation of base of 5th metatarsal and along lateral aspect of R foot  LUMBAR ROM: *Not tested at eval due to back precautions*  AROM eval  Flexion   Extension   Right lateral flexion   Left lateral flexion   Right rotation   Left rotation    (Blank rows = not tested)  LOWER EXTREMITY ROM:     Active  Right eval Left eval  Ankle dorsiflexion 4 7  Ankle plantarflexion 42 50  Ankle inversion 15 25  Ankle eversion 5 15   (Blank rows = not tested)  LOWER EXTREMITY MMT:    MMT Right eval Left eval  Hip flexion 4+ 4+  Hip extension    Hip abduction 4- 4-  Hip adduction 4+ 4+  Hip internal rotation    Hip external rotation    Knee flexion 4- 4-  Knee extension 3+ 4+  Ankle dorsiflexion 4- 4+  Ankle plantarflexion 4- 4+  Ankle inversion  Ankle eversion     (Blank rows = not tested)  LUMBAR SPECIAL TESTS:  Deferred due to back precautions  FUNCTIONAL TESTS:  5 times sit to stand: 24.80 seconds with UE use on armchair 10 meter walk test: 13.52seconds (0.74 m/s) FGA: 13/30 on 7/9  GAIT: Distance walked: approx. 167ft Assistive device utilized: None Level of assistance: Modified independence Comments: Pt demonstrated step-through pattern throughout with decreased stance time on R LE and mild Trendelenburg compensatory gait pattern on R. Antalgic gait present throughout with narrowed BOS  TREATMENT DATE: 04/02/24   Deferred any ankle treatment - will wait for  patient to have MRI   Left LE pain= 8 to 8.5/10 Left Right foot - throbbing    THEREX:    Lower back and LE stretching:  Passive Single knee to chest stretch x 30 sec hold x 3 Passive hamstring stretch - hold 30 sec x 3  Sciatic nerve flossing (passive x 20 each LE)  Hip ER/IR - hold 30 sec x 2 each LE then also mobilization- 20 reps each Lower trunk rotation- hold 30 sec x 3 then used more in mobilization- 20 reps each direction Seated lumbar flex with use of theraball x 25 reps (nice and gentle)    PATIENT EDUCATION:  Education details: Education provided on examination findings and goals for PT POC. Person educated: Patient Education method: Explanation Education comprehension: verbalized understanding  HOME EXERCISE PROGRAM: Access Code: [email or web address] URL: [email or web address] Date: 03/19/2024 Prepared by: Reyes London  Exercises - Sidelying Ankle Eversion Strengthening with Ankle Weight  - 3 x weekly - 3 sets - 10 reps - Toe Yoga - Alternating Great Toe and Lesser Toe Extension  - 3 x weekly - 3 sets - 10 reps - Towel Scrunches  - 1 x daily - 3 x weekly - 3 sets - 10 reps - Toe Spreading  - 3 x weekly - 3 sets - 10 reps    Access Code: [email or web address]  URL: [email or web address]  Date: 03/12/2024  repared by: Marina  Moser  Exercises: - Seated Ankle Plantarflexion with Resistance  - 1 x daily - 7 x weekly - 2 sets - 10 reps - 5 hold  - Seated Ankle Eversion with Resistance  - 1 x daily - 7 x weekly - 2 sets - 10 reps - 5 hold  - Seated Ankle Inversion with Resistance and Legs Crossed  - 1 x daily - 7 x weekly - 2 sets - 10 reps - 5 hold  - Seated Ankle Dorsiflexion with Resistance  - 1 x daily - 7 x weekly - 2 sets - 10 reps - 5 hold  - Seated Ankle Alphabet  - 1 x daily - 7 x weekly - 2 sets - 10 reps - 5 hold - Supine Posterior Pelvic Tilt  - 1 x daily - 7 x weekly - 2 sets - 10 reps - 5 hold  - Supine Bridge with Pelvic Floor Contraction   - 1 x daily - 7 x weekly - 2 sets - 10 reps - 5 hold   ASSESSMENT:  CLINICAL IMPRESSION: Treatment of Right foot is deferred for now- patient presenting with walking boot and podiatrist ordered MRI. Treatment shifted focus back to low back  and patient was able to perform some light ROM activities without aggravating back. Reviewed several supine Low back stretches and 1 seated stretch today with good results. Will continue to progress in subsequent visits and follow up with right foot as appropriate.  Pt  would benefit from skilled PT intervention to address listed deficits and improve strength, balance, weight bearing tolerance, and overall mobility status.   OBJECTIVE IMPAIRMENTS: Abnormal gait, decreased activity tolerance, decreased balance, decreased endurance, decreased mobility, difficulty walking, decreased ROM, decreased strength, hypomobility, impaired perceived functional ability, impaired flexibility, improper body mechanics, postural dysfunction, and pain.   ACTIVITY LIMITATIONS: carrying, lifting, bending, sitting, standing, squatting, stairs, transfers, bed mobility, bathing, toileting, hygiene/grooming, and locomotion level  PARTICIPATION LIMITATIONS: meal prep, cleaning, laundry, driving, shopping, community activity, and occupation  PERSONAL FACTORS: Age, Past/current experiences, and 3+ comorbidities: anemia, anxiety, breast cancer of L breast, cervical cancer, chronic back pain, chronic hypotension, CKD (now clear), claustrophobia, decompression injury of spinal cord, depression, dizziness, failed spinal cord stimulator, hypercholesteremia, inappropriate sinus node tachycardia, migraine, morbid obesity, neurogenic orthostatic hypotension, OA of L hip, pneumonia, seizure, spinal cord stroke are also affecting patient's functional outcome.   REHAB POTENTIAL: Good  CLINICAL DECISION MAKING: Evolving/moderate complexity  EVALUATION COMPLEXITY: Moderate   GOALS: Goals reviewed  with patient? Yes  SHORT TERM GOALS: Target date: March 18, 2024  1. Patient will be independent in home exercise program to improve strength/mobility for better functional independence with ADLs. Baseline: give next session  Goal status: INITIAL  LONG TERM GOALS: Target date: 04/22/2024   Pt will increase by at least 0.13 m/s in order to demonstrate clinically significant improvement in community ambulation.   Baseline: 0.74 without AD Goal status: INITIAL  2. Pt will decrease 5TSTS by at least 3 seconds in order to demonstrate clinically significant improvement in LE strength  Baseline: 24.8 seconds with UE use on armrests  Goal status: INITIAL  3.  Pt will decrease mODI scoreby at least 13 points in order demonstrate clinically significant reduction in pain/disability  Baseline: 36 / 50 = 72.0 % on 7/9 Goal status: INITIAL  4. Pt will increase by at least 9m (140ft) in order to demonstrate clinically significant improvement in cardiopulmonary endurance and community ambulation  Baseline: Assess next session; 03/19/24= 550 feet without AD Goal status: INITIAL   5.  Patient will increase Functional Gait Assessment score to >20/30 as to reduce fall risk and improve dynamic gait safety with community ambulation. Baseline: 13/30 on 7/9 Goal status: INITIAL  6. Patient will increase BLE gross strength to 4+/5 as to improve functional strength for independent gait, increased standing tolerance and increased ADL ability.  Baseline: see MMT chart above  Goal status: INITIAL  PLAN:  PT FREQUENCY: 2x/week  PT DURATION: 8 weeks  PLANNED INTERVENTIONS: [id number]- PT Re-evaluation, 97750- Physical Performance Testing, 97110-Therapeutic exercises, 97530- Therapeutic activity, [id number]- Neuromuscular re-education, 97535- Self Care, [id number]- Manual therapy, [id number]- Gait training, [phone]- Orthotic Initial, [phone]- Orthotic/Prosthetic subsequent, [phone]- Canalith repositioning, [id number]- Splinting,  [id number]- Electrical stimulation (unattended), [phone]- Electrical stimulation (manual), [id number]- Ultrasound, [id number]- Traction (mechanical), [id number] (1-2 muscles), [id number] (3+ muscles)- Dry Needling, Patient/Family education, Balance training, Stair training, Taping, Joint mobilization, Joint manipulation, Spinal mobilization, Scar mobilization, Compression bandaging, Vestibular training, Visual/preceptual remediation/compensation, Cognitive remediation, DME instructions, Cryotherapy, Moist heat, and Biofeedback.  PLAN FOR NEXT SESSION: Continue with core/low back strengthening functional LE strengthening        Reyes LOISE London, PT 04/02/2024, 4:39 PM

## 2024-04-05 ENCOUNTER — Ambulatory Visit
Admission: RE | Admit: 2024-04-05 | Discharge: 2024-04-05 | Disposition: A | Discharge status: Home / Self Care | Source: Ambulatory Visit | Attending: Podiatry

## 2024-04-05 DIAGNOSIS — M7671 Peroneal tendinitis, right leg: Secondary | ICD-10-CM

## 2024-04-08 ENCOUNTER — Ambulatory Visit: Payer: Self-pay | Admitting: Podiatry

## 2024-04-09 ENCOUNTER — Ambulatory Visit: Attending: Podiatry

## 2024-04-09 DIAGNOSIS — M79671 Pain in right foot: Secondary | ICD-10-CM | POA: Diagnosis present

## 2024-04-09 DIAGNOSIS — R262 Difficulty in walking, not elsewhere classified: Secondary | ICD-10-CM | POA: Insufficient documentation

## 2024-04-09 DIAGNOSIS — R2681 Unsteadiness on feet: Secondary | ICD-10-CM | POA: Diagnosis present

## 2024-04-09 DIAGNOSIS — R278 Other lack of coordination: Secondary | ICD-10-CM | POA: Insufficient documentation

## 2024-04-09 DIAGNOSIS — G8929 Other chronic pain: Secondary | ICD-10-CM | POA: Diagnosis present

## 2024-04-09 DIAGNOSIS — M5459 Other low back pain: Secondary | ICD-10-CM | POA: Diagnosis present

## 2024-04-09 DIAGNOSIS — R2689 Other abnormalities of gait and mobility: Secondary | ICD-10-CM | POA: Diagnosis present

## 2024-04-09 DIAGNOSIS — M545 Low back pain, unspecified: Secondary | ICD-10-CM | POA: Insufficient documentation

## 2024-04-09 DIAGNOSIS — M6281 Muscle weakness (generalized): Secondary | ICD-10-CM | POA: Insufficient documentation

## 2024-04-09 NOTE — Therapy (Signed)
 OUTPATIENT PHYSICAL THERAPY THORACOLUMBAR/ LOWER EXTREMITY TREATMENT   Patient Name: Vanessa Medina MRN: [id number] DOB:09/04/1971, 53 y.o., female Today's Date: 04/09/2024  END OF SESSION:  PT End of Session - 04/09/24 1041     Visit Number 5    Number of Visits 16    Date for PT Re-Evaluation 04/22/24    Authorization Type 27 allowed visits    Progress Note Due on Visit 10    PT Start Time 1018    PT Stop Time 1059    PT Time Calculation (min) 41 min    Equipment Utilized During Treatment Gait belt    Activity Tolerance Patient tolerated treatment well;Treatment limited secondary to medical complications (Comment)    Behavior During Therapy WFL for tasks assessed/performed            Past Medical History:  Diagnosis Date   Acute nontraumatic kidney injury (HCC)    Anemia    Anxiety    Benign cyst of kidney    Breast cancer (HCC) 01/18/2023   left breast   Cervical cancer (HCC)    Chronic back pain    Chronic hypotension    Chronic kidney disease    Previous now clear   Claustrophobia    Decompression injury of spinal cord    Depression    Dizziness    Failed spinal cord stimulator (HCC)    Family history of adverse reaction to anesthesia      my mother takes a long time time wake up   History of dysplastic nevus 01/18/2018   right shoulder, recurrent dysplastic nevus   History of kidney stones    History of shingles    Hypercholesteremia    Inappropriate sinus node tachycardia (HCC)    Migraine    Morbid obesity (HCC)    Multiple allergies    Neurogenic orthostatic hypotension (HCC)    Obesity    Osteoarthritis    left hip   Pneumonia    PONV (postoperative nausea and vomiting)    delayed emergence   Seizure (HCC) 2021   x1-after a back surgery with spinal cord stimulator   Slow to wake up after anesthesia    Stroke (HCC) 08/11/2020   spinal cord stroke, spinal cord puncture during surgery   Wears glasses    Past Surgical History:   Procedure Laterality Date   ABDOMINAL HYSTERECTOMY     APPENDECTOMY     BACK SURGERY     BILIOPANCREATIC DIVISION W DUODENAL SWITCH N/A    BREAST BIOPSY Left 12/06/2022   u/s bx,1:00 heart clip, path pending   BREAST BIOPSY Left 12/06/2022   us  bx 2:00 ribbon clip path pending   BREAST BIOPSY Left 12/06/2022   US  LT BREAST BX W LOC DEV EA ADD LESION IMG BX SPEC US  GUIDE 12/06/2022 [facility]   BREAST BIOPSY Left 12/06/2022   US  LT BREAST BX W LOC DEV 1ST LESION IMG BX SPEC US  GUIDE 12/06/2022 [facility]   BREAST RECONSTRUCTION WITH PLACEMENT OF TISSUE EXPANDER AND FLEX HD (ACELLULAR HYDRATED DERMIS) Bilateral 01/18/2023   Procedure: IMMEDIATE LEFT BREAST RECONSTRUCTION WITH PLACEMENT OF TISSUE EXPANDER AND FLEX HD (ACELLULAR HYDRATED DERMIS);  Surgeon: [name], DO;  Location: [facility];  Service: Plastics;  Laterality: Bilateral;   BREAST RECONSTRUCTION WITH PLACEMENT OF TISSUE EXPANDER AND FLEX HD (ACELLULAR HYDRATED DERMIS) Left 03/26/2023   Procedure: BREAST RECONSTRUCTION WITH PLACEMENT OF TISSUE EXPANDER;  Surgeon: [name], DO;  Location: [facility];  Service: Plastics;  Laterality: Left;   CHOLECYSTECTOMY     COLONOSCOPY N/A 06/27/2021   Procedure: COLONOSCOPY;  Surgeon: Onita Elspeth Sharper, DO;  Location: [facility] END[facility]COPY;  Service: Gastroenterology;  Laterality: N/A;   COLONOSCOPY N/A 08/27/2023   Procedure: COLONOSCOPY;  Surgeon: Onita Elspeth Sharper, DO;  Location: [facility];  Service: Gastroenterology;  Laterality: N/A;   DIAGNOSTIC LAPAROSCOPY     LOA   ESOPHAGOGASTRODUODENOSCOPY N/A 06/27/2021   Procedure: ESOPHAGOGASTRODUODENOSCOPY (EGD);  Surgeon: Onita Elspeth Sharper, DO;  Location: [facility] ENDOSCOPY;  Service: Gastroenterology;  Laterality: N/A;   ESOPHAGOGASTRODUODENOSCOPY (EGD) WITH PROPOFOL  N/A 07/25/2017   Procedure: ESOPHAGOGASTRODUODENOSCOPY (EGD) WITH PROPOFOL ;  Surgeon: Viktoria Lamar DASEN, MD;  Location: [facility]  ENDOSCOPY;  Service: Endoscopy;  Laterality: N/A;   HERNIA REPAIR     KNEE ARTHROSCOPY Right    LAPAROSCOPIC GASTRIC SLEEVE RESECTION WITH HIATAL HERNIA REPAIR     LUMBAR FUSION  08/29/2022   Revision Lumbar two through five Laminectomy & Fusion with Transforaminal Lumbar interbody fusion   MASTECTOMY W/ SENTINEL NODE BIOPSY Bilateral 01/18/2023   Procedure: MASTECTOMY WITH SENTINEL LYMPH NODE BIOPSY, RNFA to assist;  Surgeon: Jordis Laneta FALCON, MD;  Location: [facility];  Service: General;  Laterality: Bilateral;   PULSE GENERATOR IMPLANT N/A 01/24/2024   Procedure: UNILATERAL PULSE GENERATOR IMPLANT;  Surgeon: [name] ORN, MD;  Location: [facility];  Service: Neurosurgery;  Laterality: N/A;  INTERNAL PULSE GENERATOR PLACEMENT FOR SPINAL CORD STIMULATOR   REMOVAL OF TISSUE EXPANDER AND PLACEMENT OF IMPLANT Bilateral 06/06/2023   Procedure: REMOVAL OF TISSUE EXPANDER AND PLACEMENT OF IMPLANT;  Surgeon: [name], DO;  Location: [facility];  Service: Plastics;  Laterality: Bilateral;   SPINAL CORD STIMULATOR IMPLANT     THORACIC LAMINECTOMY FOR SPINAL CORD STIMULATOR N/A 01/15/2024   Procedure: THORACIC LAMINECTOMY FOR SPINAL CORD STIMULATOR;  Surgeon: [name] ORN, MD;  Location: [facility];  Service: Neurosurgery;  Laterality: N/A;  THORACIC LAMINECTOMY FOR SPINAL CORD STIMULATOR PADDLE TRIAL   TONSILLECTOMY     TOTAL HIP ARTHROPLASTY Left 01/26/2016   Procedure: LEFT TOTAL HIP ARTHROPLASTY ANTERIOR APPROACH;  Surgeon: Kay CHRISTELLA Cummins, MD;  Location: [facility];  Service: Orthopedics;  Laterality: Left;   TRANSFORAMINAL LUMBAR INTERBODY FUSION (TLIF) WITH PEDICLE SCREW FIXATION 3 LEVEL  08/2019   Lynwood Better, MD L2-L5   Patient Active Problem List   Diagnosis Date Noted   Breast asymmetry following reconstructive surgery 03/25/2024   S/P insertion of spinal cord stimulator 03/21/2024   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   Pain and swelling of lower leg 09/25/2023    Lymphedema 09/25/2023   Long term (current) use of aromatase inhibitors 09/07/2023   At risk for loss of bone density 09/07/2023   Changing skin lesion 09/04/2023   Frequent falls 08/22/2023   Chronic pain syndrome 08/22/2023   Bilateral lower extremity edema 08/06/2023   Back pain 08/06/2023   Claustrophobia    Pernicious anemia 03/14/2023   Ruptured left breast implant 02/13/2023   S/P mastectomy, bilateral 01/18/2023   Genetic testing 12/18/2022   Breast cancer (HCC) 12/08/2022   Family history of breast cancer 12/08/2022   Profound fatigue 12/08/2022   Lumbar post-laminectomy syndrome 06/02/2022   Pseudarthrosis after fusion or arthrodesis 05/19/2022   Primary osteoarthritis of right knee 03/28/2022   Subjective tinnitus of both ears 12/21/2021   Abnormality of gait 04/29/2021   Abnormal sensation in both ears 04/06/2021   Other adverse food reactions, not elsewhere classified, subsequent encounter 04/06/2021   Multiple drug  allergies 04/06/2021   Insomnia due to medical condition 02/25/2021   Myofascial muscle pain 02/25/2021   Nerve pain 10/18/2020   Incomplete paraplegia (HCC) 10/18/2020   Orthostatic hypotension 10/18/2020   Renal cyst 09/23/2020   Chronic migraine without aura without status migrainosus, not intractable    Hypokalemia    Adjustment reaction with anxiety and depression    Spinal cord injury, lumbar, without spinal bone injury, sequela (HCC) 08/18/2020   Paraparesis (HCC)    Post-operative pain    Hypotension    Slow transit constipation    AKI (acute kidney injury) (HCC)    Right leg weakness 08/11/2020   DDD (degenerative disc disease), lumbar 09/02/2019    Class: Chronic   Degenerative disc disease, lumbar 09/02/2019   Chest tightness 09/23/2018   Bradycardia 09/23/2018   BMI 40.0-44.9, adult (HCC) 04/17/2018   Status post bariatric surgery 04/17/2018   Labile blood pressure 03/08/2018   Chronic low back pain 09/03/2017   History of total  hip replacement, left 01/26/2016   Osteoarthritis of spine with radiculopathy, lumbar region 10/16/2014   LEG PAIN, BILATERAL 12/16/2007   Malignant neoplasm of cervix uteri (HCC) 07/02/2007   MORBID OBESITY 07/02/2007   DEPRESSION 07/02/2007   Migraine without aura 07/02/2007   Other allergic rhinitis 07/02/2007   Asthma 07/02/2007   GERD 07/02/2007   ELEVATED BLOOD PRESSURE WITHOUT DIAGNOSIS OF HYPERTENSION 07/02/2007   Asthma 07/02/2007    PCP: Lynwood Null, MD  REFERRING PROVIDER: Juliene Medicine, DPM; Lyle Decamp, PA-C  REFERRING DIAG:  [phone] (ICD-10-CM) - Peroneal tendinitis of right lower extremity   Z96.89 (ICD-10-CM) - S/P insertion of spinal cord stimulator   Rationale for Evaluation and Treatment: Rehabilitation  THERAPY DIAG:  Muscle weakness (generalized)  Difficulty in walking, not elsewhere classified  Other low back pain  Pain in right foot  Unsteadiness on feet  ONSET DATE: Spinal cord stimulator placed on 01/24/24 Foot pain began around 6 months ago  SUBJECTIVE:                                                                                                                                                                                           SUBJECTIVE STATEMENT: Patient reports she received her results from her MRI and states she has a tear in her peroneal brevis. Awaiting f/u with MD to discuss results and plan. States she spent 6 hours in standing.   PERTINENT HISTORY:  Pt is well-known to this clinic. Underwent placement for permanent spinal cord stimulator on 01/24/24 after a successful trial. Pt with a history of diagnosed chronic pain syndrome and failed back surgery syndrome. Pt has  a history of plantar fasciitis and prior foot surgeries and was seen by Podiatrist on 6/11 and was given diagnosis of peroneal tendonitis. Per Podiatry note: Insertional peroneal tendonitis. Has had some improvement with corticosteroid injection.  Recommended  formal physical therapy.  Referral was sent.  Return in 6 weeks.  If no improvement recommend MRI Patient has noticed recent increase in hair loss, will return to oncology physician next week to address concerns.   PMH includes anemia, anxiety, breast cancer of L breast, cervical cancer, chronic back pain, chronic hypotension, CKD (now clear), claustrophobia, decompression injury of spinal cord, depression, dizziness, failed spinal cord stimulator, hypercholesteremia, inappropriate sinus node tachycardia, migraine, morbid obesity, neurogenic orthostatic hypotension, OA of L hip, pneumonia, seizure, spinal cord stroke  See chart for full past surgical history  PAIN: Reporting on R foot pain: Are you having pain? Yes: NPRS scale: 3/10 currently, 7/10 at worst, 1/10 Pain location: On the top/lateral aspect of her R foot Pain description: throbbing, stabbing Aggravating factors: walking after about 40 minutes Relieving factors: getting off of it, elevating it  PRECAUTIONS: Back - until 6 weeks post-op (July 3rd, 2025)- pt able to recall precautions without cueing: no bending, lifting, twisting   RED FLAGS: None   WEIGHT BEARING RESTRICTIONS: No  FALLS:  Has patient fallen in last 6 months? Yes. 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  DIAGNOSTIC FINDINGS:   Right foot radiograph: enthesopathy at the fifth metatarsal base (12/31/2023)   PATIENT SURVEYS:  mODI: 36 / 50 = 72.0 %  FADI: 24/50- 48%  COGNITION: Overall cognitive status: Within functional limits for tasks assessed     SENSATION: WFL  MUSCLE LENGTH: Deferred at eval due to back precautions Hamstrings:  Thomas test:   POSTURE: rounded shoulders and forward head  PALPATION: Tender on palpation of base of 5th metatarsal and along lateral aspect of R foot  LUMBAR ROM: *Not tested at eval due to back precautions*  AROM eval  Flexion   Extension   Right lateral flexion   Left lateral flexion   Right rotation   Left rotation    (Blank rows = not tested)  LOWER EXTREMITY ROM:     Active  Right eval Left eval  Ankle dorsiflexion 4 7  Ankle plantarflexion 42 50  Ankle inversion 15 25  Ankle eversion 5 15   (Blank rows = not tested)  LOWER EXTREMITY MMT:    MMT Right eval Left eval  Hip flexion 4+ 4+  Hip extension    Hip abduction 4- 4-  Hip adduction 4+ 4+  Hip internal rotation    Hip external rotation    Knee flexion 4- 4-  Knee extension 3+ 4+  Ankle dorsiflexion 4- 4+  Ankle plantarflexion 4- 4+  Ankle inversion    Ankle eversion     (Blank rows = not tested)  LUMBAR SPECIAL TESTS:  Deferred due to back precautions  FUNCTIONAL TESTS:  5 times sit to stand: 24.80 seconds with UE use on armchair 10 meter walk test: 13.52seconds (0.74 m/s) FGA: 13/30 on 7/9  GAIT: Distance walked: approx. 136ft Assistive device utilized: None Level of assistance: Modified independence Comments: Pt demonstrated step-through pattern throughout with decreased stance time on R LE and mild Trendelenburg compensatory gait pattern on R. Antalgic gait present throughout with narrowed BOS  TREATMENT DATE: 04/09/24   Deferred any ankle treatment - will wait for patient to discuss  MRI results with MD  Left LE pain=  8.5/10 Left Right foot - throbbing  Low back-  7/10  THEREX:    Lower back and LE stretching:  Self stretch- Single knee to chest stretch x 30 sec hold x 3 Lower trunk rotation- hold 30 sec x 3 then used more in mobilization- 20 reps each direction Hip ER/IR - hold 30 sec x 2 each LE then also mobilization- 20 reps each Core activation:  Bridging 2 x 10  Sidelye clamshell 2 x 10 ea side Sidelye hip abd x 10 reps ea side.   Self care/home management:  Discussion of items listed below in education and HEP program  PATIENT EDUCATION:  Education details: Education provided anatomy of ankle and anatomy of core and how to engage. Instructed in below HEP. Person educated: Patient Education method: Explanation Education comprehension: verbalized understanding  HOME EXERCISE PROGRAM:  Access Code: [email or web address] URL: [email or web address] Date: 04/09/2024 Prepared by: Reyes London  Exercises - Supine Transversus Abdominis Bracing - Hands on Ground  - 3 x weekly - 2 sets - 10 reps - 5 hold - Supine Transversus Abdominis Bracing with Leg Extension  - 3 x weekly - 3 sets - 10 reps - Supine Bridge  - 3 x weekly - 3 sets - 10 reps - Clamshell  - 3 x weekly - 3 sets - 10 reps - Sidelying Hip Abduction  - 3 x weekly - 3 sets - 10 reps       Access Code: [email or web address] URL: [email or web address] Date: 03/19/2024 Prepared by: Reyes London  Exercises - Sidelying Ankle Eversion Strengthening with Ankle Weight  - 3 x weekly - 3 sets - 10 reps - Toe Yoga - Alternating Great Toe and Lesser Toe Extension  - 3 x weekly - 3 sets - 10 reps - Towel Scrunches  - 1 x daily - 3 x weekly - 3 sets - 10 reps - Toe Spreading  - 3 x weekly - 3 sets - 10 reps    Access Code: [email or web address]  URL: [email or web address]  Date: 03/12/2024  repared by: Marina  Moser  Exercises: - Seated Ankle Plantarflexion with Resistance  - 1 x daily - 7 x weekly - 2 sets - 10 reps - 5 hold  - Seated Ankle Eversion with  Resistance  - 1 x daily - 7 x weekly - 2 sets - 10 reps - 5 hold  - Seated Ankle Inversion with Resistance and Legs Crossed  - 1 x daily - 7 x weekly - 2 sets - 10 reps - 5 hold  - Seated Ankle Dorsiflexion with Resistance  - 1 x daily - 7 x weekly - 2 sets - 10 reps - 5 hold  - Seated Ankle Alphabet  - 1 x daily - 7 x weekly - 2 sets - 10 reps - 5 hold - Supine Posterior Pelvic Tilt  - 1 x daily - 7 x weekly -  2 sets - 10 reps - 5 hold  - Supine Bridge with Pelvic Floor Contraction  - 1 x daily - 7 x weekly - 2 sets - 10 reps - 5 hold   ASSESSMENT:  CLINICAL IMPRESSION: Treatment included some discussion of self care- including reviewing musculature with ankle issue and then discussed core stabilization in detail including which muscles are activated. Then patient performed a few low back stretching then added several core activities into her HEP. Will continue to hold on right ankle until patient sees MD and await further recommendations.  Pt would benefit from skilled PT intervention to address listed deficits and improve strength, balance, weight bearing tolerance, and overall mobility status.   OBJECTIVE IMPAIRMENTS: Abnormal gait, decreased activity tolerance, decreased balance, decreased endurance, decreased mobility, difficulty walking, decreased ROM, decreased strength, hypomobility, impaired perceived functional ability, impaired flexibility, improper body mechanics, postural dysfunction, and pain.   ACTIVITY LIMITATIONS: carrying, lifting, bending, sitting, standing, squatting, stairs, transfers, bed mobility, bathing, toileting, hygiene/grooming, and locomotion level  PARTICIPATION LIMITATIONS: meal prep, cleaning, laundry, driving, shopping, community activity, and occupation  PERSONAL FACTORS: Age, Past/current experiences, and 3+ comorbidities: anemia, anxiety, breast cancer of L breast, cervical cancer, chronic back pain, chronic hypotension, CKD (now clear), claustrophobia,  decompression injury of spinal cord, depression, dizziness, failed spinal cord stimulator, hypercholesteremia, inappropriate sinus node tachycardia, migraine, morbid obesity, neurogenic orthostatic hypotension, OA of L hip, pneumonia, seizure, spinal cord stroke are also affecting patient's functional outcome.   REHAB POTENTIAL: Good  CLINICAL DECISION MAKING: Evolving/moderate complexity  EVALUATION COMPLEXITY: Moderate   GOALS: Goals reviewed with patient? Yes  SHORT TERM GOALS: Target date: March 18, 2024  1. Patient will be independent in home exercise program to improve strength/mobility for better functional independence with ADLs. Baseline: give next session  Goal status: INITIAL  LONG TERM GOALS: Target date: 04/22/2024   Pt will increase by at least 0.13 m/s in order to demonstrate clinically significant improvement in community ambulation.   Baseline: 0.74 without AD Goal status: INITIAL  2. Pt will decrease 5TSTS by at least 3 seconds in order to demonstrate clinically significant improvement in LE strength  Baseline: 24.8 seconds with UE use on armrests  Goal status: INITIAL  3.  Pt will decrease mODI scoreby at least 13 points in order demonstrate clinically significant reduction in pain/disability  Baseline: 36 / 50 = 72.0 % on 7/9 Goal status: INITIAL  4. Pt will increase by at least 75m (111ft) in order to demonstrate clinically significant improvement in cardiopulmonary endurance and community ambulation  Baseline: Assess next session; 03/19/24= 550 feet without AD Goal status: INITIAL   5.  Patient will increase Functional Gait Assessment score to >20/30 as to reduce fall risk and improve dynamic gait safety with community ambulation. Baseline: 13/30 on 7/9 Goal status: INITIAL  6. Patient will increase BLE gross strength to 4+/5 as to improve functional strength for independent gait, increased standing tolerance and increased ADL  ability.  Baseline: see MMT chart above  Goal status: INITIAL  PLAN:  PT FREQUENCY: 2x/week  PT DURATION: 8 weeks  PLANNED INTERVENTIONS: [id number]- PT Re-evaluation, 97750- Physical Performance Testing, 97110-Therapeutic exercises, 97530- Therapeutic activity, [id number]- Neuromuscular re-education, 97535- Self Care, [id number]- Manual therapy, [id number]- Gait training, [phone]- Orthotic Initial, [id number]- Orthotic/Prosthetic subsequent, [phone]- Canalith repositioning, [id number]- Splinting, [id number]- Electrical stimulation (unattended), [id number]- Electrical stimulation (manual), [id number]- Ultrasound, [id number]- Traction (mechanical), [id number] (1-2 muscles), [id number] (3+ muscles)- Dry Needling, Patient/Family education, Balance training, Stair training, Taping, Joint mobilization,  Joint manipulation, Spinal mobilization, Scar mobilization, Compression bandaging, Vestibular training, Visual/preceptual remediation/compensation, Cognitive remediation, DME instructions, Cryotherapy, Moist heat, and Biofeedback.  PLAN FOR NEXT SESSION: Continue with core/low back strengthening Review activities added to HEP and progress as appropriate.        Reyes LOISE London, PT 04/09/2024, 1:36 PM

## 2024-04-16 ENCOUNTER — Ambulatory Visit (INDEPENDENT_AMBULATORY_CARE_PROVIDER_SITE_OTHER): Admitting: Podiatry

## 2024-04-16 ENCOUNTER — Ambulatory Visit

## 2024-04-16 VITALS — Ht 64.0 in | Wt 267.2 lb

## 2024-04-16 DIAGNOSIS — R2681 Unsteadiness on feet: Secondary | ICD-10-CM

## 2024-04-16 DIAGNOSIS — M958 Other specified acquired deformities of musculoskeletal system: Secondary | ICD-10-CM | POA: Diagnosis not present

## 2024-04-16 DIAGNOSIS — M6281 Muscle weakness (generalized): Secondary | ICD-10-CM | POA: Diagnosis not present

## 2024-04-16 DIAGNOSIS — M79671 Pain in right foot: Secondary | ICD-10-CM

## 2024-04-16 DIAGNOSIS — S86311A Strain of muscle(s) and tendon(s) of peroneal muscle group at lower leg level, right leg, initial encounter: Secondary | ICD-10-CM

## 2024-04-16 DIAGNOSIS — M5459 Other low back pain: Secondary | ICD-10-CM

## 2024-04-16 DIAGNOSIS — R262 Difficulty in walking, not elsewhere classified: Secondary | ICD-10-CM

## 2024-04-16 NOTE — Therapy (Signed)
 OUTPATIENT PHYSICAL THERAPY THORACOLUMBAR/ LOWER EXTREMITY TREATMENT   Patient Name: Vanessa Medina MRN: [id number] DOB:[birth year], 53 y.o., female Today's Date: 04/16/2024  END OF SESSION:  PT End of Session - 04/16/24 1021     Visit Number 6    Number of Visits 16    Date for PT Re-Evaluation 04/22/24    Authorization Type 27 allowed visits    Progress Note Due on Visit 10    PT Start Time 1013    PT Stop Time 1059    PT Time Calculation (min) 46 min    Equipment Utilized During Treatment Gait belt    Activity Tolerance Patient tolerated treatment well;Treatment limited secondary to medical complications (Comment)    Behavior During Therapy WFL for tasks assessed/performed             Past Medical History:  Diagnosis Date   Acute nontraumatic kidney injury (HCC)    Anemia    Anxiety    Benign cyst of kidney    Breast cancer (HCC) 01/18/2023   left breast   Cervical cancer (HCC)    Chronic back pain    Chronic hypotension    Chronic kidney disease    Previous now clear   Claustrophobia    Decompression injury of spinal cord    Depression    Dizziness    Failed spinal cord stimulator (HCC)    Family history of adverse reaction to anesthesia      my mother takes a long time time wake up   History of dysplastic nevus 01/18/2018   right shoulder, recurrent dysplastic nevus   History of kidney stones    History of shingles    Hypercholesteremia    Inappropriate sinus node tachycardia (HCC)    Migraine    Morbid obesity (HCC)    Multiple allergies    Neurogenic orthostatic hypotension (HCC)    Obesity    Osteoarthritis    left hip   Pneumonia    PONV (postoperative nausea and vomiting)    delayed emergence   Seizure (HCC) 2021   x1-after a back surgery with spinal cord stimulator   Slow to wake up after anesthesia    Stroke (HCC) 08/11/2020   spinal cord stroke, spinal cord puncture during surgery   Wears glasses    Past Surgical History:   Procedure Laterality Date   ABDOMINAL HYSTERECTOMY     APPENDECTOMY     BACK SURGERY     BILIOPANCREATIC DIVISION W DUODENAL SWITCH N/A    BREAST BIOPSY Left 12/06/2022   u/s bx,1:00 heart clip, path pending   BREAST BIOPSY Left 12/06/2022   us  bx 2:00 ribbon clip path pending   BREAST BIOPSY Left 12/06/2022   US  LT BREAST BX W LOC DEV EA ADD LESION IMG BX SPEC US  GUIDE 12/06/2022 [facility]   BREAST BIOPSY Left 12/06/2022   US  LT BREAST BX W LOC DEV 1ST LESION IMG BX SPEC US  GUIDE 12/06/2022 [facility]   BREAST RECONSTRUCTION WITH PLACEMENT OF TISSUE EXPANDER AND FLEX HD (ACELLULAR HYDRATED DERMIS) Bilateral 01/18/2023   Procedure: IMMEDIATE LEFT BREAST RECONSTRUCTION WITH PLACEMENT OF TISSUE EXPANDER AND FLEX HD (ACELLULAR HYDRATED DERMIS);  Surgeon: [name], DO;  Location: [facility];  Service: Plastics;  Laterality: Bilateral;   BREAST RECONSTRUCTION WITH PLACEMENT OF TISSUE EXPANDER AND FLEX HD (ACELLULAR HYDRATED DERMIS) Left 03/26/2023   Procedure: BREAST RECONSTRUCTION WITH PLACEMENT OF TISSUE EXPANDER;  Surgeon: [name], DO;  Location: [facility];  Service:  Plastics;  Laterality: Left;   CHOLECYSTECTOMY     COLONOSCOPY N/A 06/27/2021   Procedure: COLONOSCOPY;  Surgeon: Onita Elspeth Sharper, DO;  Location: [facility] ENDOSCOPY;  Service: Gastroenterology;  Laterality: N/A;   COLONOSCOPY N/A 08/27/2023   Procedure: COLONOSCOPY;  Surgeon: Onita Elspeth Sharper, DO;  Location: [facility]COPY;  Service: Gastroenterology;  Laterality: N/A;   DIAGNOSTIC LAPAROSCOPY     LOA   ESOPHAGOGASTRODUODENOSCOPY N/A 06/27/2021   Procedure: ESOPHAGOGASTRODUODENOSCOPY (EGD);  Surgeon: Onita Elspeth Sharper, DO;  Location: [facility];  Service: Gastroenterology;  Laterality: N/A;   ESOPHAGOGASTRODUODENOSCOPY (EGD) WITH PROPOFOL  N/A 07/25/2017   Procedure: ESOPHAGOGASTRODUODENOSCOPY (EGD) WITH PROPOFOL ;  Surgeon: Viktoria Lamar DASEN, MD;  Location: [facility]  ENDOSCOPY;  Service: Endoscopy;  Laterality: N/A;   HERNIA REPAIR     KNEE ARTHROSCOPY Right    LAPAROSCOPIC GASTRIC SLEEVE RESECTION WITH HIATAL HERNIA REPAIR     LUMBAR FUSION  08/29/2022   Revision Lumbar two through five Laminectomy & Fusion with Transforaminal Lumbar interbody fusion   MASTECTOMY W/ SENTINEL NODE BIOPSY Bilateral 01/18/2023   Procedure: MASTECTOMY WITH SENTINEL LYMPH NODE BIOPSY, RNFA to assist;  Surgeon: Jordis Laneta FALCON, MD;  Location: [facility];  Service: General;  Laterality: Bilateral;   PULSE GENERATOR IMPLANT N/A 01/24/2024   Procedure: UNILATERAL PULSE GENERATOR IMPLANT;  Surgeon: [name] ORN, MD;  Location: [facility];  Service: Neurosurgery;  Laterality: N/A;  INTERNAL PULSE GENERATOR PLACEMENT FOR SPINAL CORD STIMULATOR   REMOVAL OF TISSUE EXPANDER AND PLACEMENT OF IMPLANT Bilateral 06/06/2023   Procedure: REMOVAL OF TISSUE EXPANDER AND PLACEMENT OF IMPLANT;  Surgeon: [name], DO;  Location: [facility];  Service: Plastics;  Laterality: Bilateral;   SPINAL CORD STIMULATOR IMPLANT     THORACIC LAMINECTOMY FOR SPINAL CORD STIMULATOR N/A 01/15/2024   Procedure: THORACIC LAMINECTOMY FOR SPINAL CORD STIMULATOR;  Surgeon: [name] ORN, MD;  Location: [facility];  Service: Neurosurgery;  Laterality: N/A;  THORACIC LAMINECTOMY FOR SPINAL CORD STIMULATOR PADDLE TRIAL   TONSILLECTOMY     TOTAL HIP ARTHROPLASTY Left 01/26/2016   Procedure: LEFT TOTAL HIP ARTHROPLASTY ANTERIOR APPROACH;  Surgeon: Kay CHRISTELLA Cummins, MD;  Location: [facility];  Service: Orthopedics;  Laterality: Left;   TRANSFORAMINAL LUMBAR INTERBODY FUSION (TLIF) WITH PEDICLE SCREW FIXATION 3 LEVEL  08/2019   Lynwood Better, MD L2-L5   Patient Active Problem List   Diagnosis Date Noted   Breast asymmetry following reconstructive surgery 03/25/2024   S/P insertion of spinal cord stimulator 03/21/2024   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   Pain and swelling of lower leg 09/25/2023    Lymphedema 09/25/2023   Long term (current) use of aromatase inhibitors 09/07/2023   At risk for loss of bone density 09/07/2023   Changing skin lesion 09/04/2023   Frequent falls 08/22/2023   Chronic pain syndrome 08/22/2023   Bilateral lower extremity edema 08/06/2023   Back pain 08/06/2023   Claustrophobia    Pernicious anemia 03/14/2023   Ruptured left breast implant 02/13/2023   S/P mastectomy, bilateral 01/18/2023   Genetic testing 12/18/2022   Breast cancer (HCC) 12/08/2022   Family history of breast cancer 12/08/2022   Profound fatigue 12/08/2022   Lumbar post-laminectomy syndrome 06/02/2022   Pseudarthrosis after fusion or arthrodesis 05/19/2022   Primary osteoarthritis of right knee 03/28/2022   Subjective tinnitus of both ears 12/21/2021   Abnormality of gait 04/29/2021   Abnormal sensation in both ears 04/06/2021   Other adverse food reactions, not elsewhere classified, subsequent encounter 04/06/2021  Multiple drug allergies 04/06/2021   Insomnia due to medical condition 02/25/2021   Myofascial muscle pain 02/25/2021   Nerve pain 10/18/2020   Incomplete paraplegia (HCC) 10/18/2020   Orthostatic hypotension 10/18/2020   Renal cyst 09/23/2020   Chronic migraine without aura without status migrainosus, not intractable    Hypokalemia    Adjustment reaction with anxiety and depression    Spinal cord injury, lumbar, without spinal bone injury, sequela (HCC) 08/18/2020   Paraparesis (HCC)    Post-operative pain    Hypotension    Slow transit constipation    AKI (acute kidney injury) (HCC)    Right leg weakness 08/11/2020   DDD (degenerative disc disease), lumbar 09/02/2019    Class: Chronic   Degenerative disc disease, lumbar 09/02/2019   Chest tightness 09/23/2018   Bradycardia 09/23/2018   BMI 40.0-44.9, adult (HCC) 04/17/2018   Status post bariatric surgery 04/17/2018   Labile blood pressure 03/08/2018   Chronic low back pain 09/03/2017   History of total  hip replacement, left 01/26/2016   Osteoarthritis of spine with radiculopathy, lumbar region 10/16/2014   LEG PAIN, BILATERAL 12/16/2007   Malignant neoplasm of cervix uteri (HCC) 07/02/2007   MORBID OBESITY 07/02/2007   DEPRESSION 07/02/2007   Migraine without aura 07/02/2007   Other allergic rhinitis 07/02/2007   Asthma 07/02/2007   GERD 07/02/2007   ELEVATED BLOOD PRESSURE WITHOUT DIAGNOSIS OF HYPERTENSION 07/02/2007   Asthma 07/02/2007    PCP: Lynwood Null, MD  REFERRING PROVIDER: Juliene Medicine, DPM; Lyle Decamp, PA-C  REFERRING DIAG:  [id number] (ICD-10-CM) - Peroneal tendinitis of right lower extremity   Z96.89 (ICD-10-CM) - S/P insertion of spinal cord stimulator   Rationale for Evaluation and Treatment: Rehabilitation  THERAPY DIAG:  Muscle weakness (generalized)  Difficulty in walking, not elsewhere classified  Other low back pain  Pain in right foot  Unsteadiness on feet  ONSET DATE: Spinal cord stimulator placed on 01/24/24 Foot pain began around 6 months ago  SUBJECTIVE:                                                                                                                                                                                           SUBJECTIVE STATEMENT: Patient reports plans for R foot surgery for peroneal tear but going to put off til after has breast surgery. Would like to continue with low back treatment. Reports 5/10 low back pain.    PERTINENT HISTORY:  Pt is well-known to this clinic. Underwent placement for permanent spinal cord stimulator on 01/24/24 after a successful trial. Pt with a history of diagnosed chronic pain syndrome and failed back surgery syndrome. Pt  has a history of plantar fasciitis and prior foot surgeries and was seen by Podiatrist on 6/11 and was given diagnosis of peroneal tendonitis. Per Podiatry note: Insertional peroneal tendonitis. Has had some improvement with corticosteroid injection.  Recommended formal  physical therapy.  Referral was sent.  Return in 6 weeks.  If no improvement recommend MRI Patient has noticed recent increase in hair loss, will return to oncology physician next week to address concerns.   PMH includes anemia, anxiety, breast cancer of L breast, cervical cancer, chronic back pain, chronic hypotension, CKD (now clear), claustrophobia, decompression injury of spinal cord, depression, dizziness, failed spinal cord stimulator, hypercholesteremia, inappropriate sinus node tachycardia, migraine, morbid obesity, neurogenic orthostatic hypotension, OA of L hip, pneumonia, seizure, spinal cord stroke  See chart for full past surgical history  PAIN: Reporting on R foot pain: Are you having pain? Yes: NPRS scale: 3/10 currently, 7/10 at worst, 1/10 Pain location: On the top/lateral aspect of her R foot Pain description: throbbing, stabbing Aggravating factors: walking after about 40 minutes Relieving factors: getting off of it, elevating it  PRECAUTIONS: Back - until 6 weeks post-op (July 3rd, 2025)- pt able to recall precautions without cueing: no bending, lifting, twisting   RED FLAGS: None   WEIGHT BEARING RESTRICTIONS: No  FALLS:  Has patient fallen in last 6 months? Yes. 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  DIAGNOSTIC FINDINGS:  Right  foot radiograph: enthesopathy at the fifth metatarsal base (12/31/2023)   PATIENT SURVEYS:  mODI: 36 / 50 = 72.0 %  FADI: 24/50- 48%  COGNITION: Overall cognitive status: Within functional limits for tasks assessed     SENSATION: WFL  MUSCLE LENGTH: Deferred at eval due to back precautions Hamstrings:  Thomas test:   POSTURE: rounded shoulders and forward head  PALPATION: Tender on palpation of base of 5th metatarsal and along lateral aspect of R foot  LUMBAR ROM: *Not tested at eval due to back precautions*  AROM eval  Flexion   Extension   Right lateral flexion   Left lateral flexion   Right rotation   Left rotation    (Blank rows = not tested)  LOWER EXTREMITY ROM:     Active  Right eval Left eval  Ankle dorsiflexion 4 7  Ankle plantarflexion 42 50  Ankle inversion 15 25  Ankle eversion 5 15   (Blank rows = not tested)  LOWER EXTREMITY MMT:    MMT Right eval Left eval  Hip flexion 4+ 4+  Hip extension    Hip abduction 4- 4-  Hip adduction 4+ 4+  Hip internal rotation    Hip external rotation    Knee flexion 4- 4-  Knee extension 3+ 4+  Ankle dorsiflexion 4- 4+  Ankle plantarflexion 4- 4+  Ankle inversion    Ankle eversion     (Blank rows = not  tested)  LUMBAR SPECIAL TESTS:  Deferred due to back precautions  FUNCTIONAL TESTS:  5 times sit to stand: 24.80 seconds with UE use on armchair 10 meter walk test: 13.52seconds (0.74 m/s) FGA: 13/30 on 7/9  GAIT: Distance walked: approx. 158ft Assistive device utilized: None Level of assistance: Modified independence Comments: Pt demonstrated step-through pattern throughout with decreased stance time on R LE and mild Trendelenburg compensatory gait pattern on R. Antalgic gait present throughout with narrowed BOS  TREATMENT DATE: 04/16/24   Deferred any ankle treatment - Patient going to have ankle Surgery in near future.   Low back- 5/10  THEREX:    Lower back and LE stretching:  LLE  Self stretch supine-  figure 4 x 30 sec hold x 5    TA:  Core activation:  Abdominal core activation- with hip march in hooklye- x 10 reps (VC for breathing and activating core)  Abdominal core activation- Hip abd/add x 10 reps each LE Bridging x 6- stopped due to R hamstring cramping  Sidelye clamshell 2 x 10 ea side Seated lumbar flex with theraball x 25 reps for pain relief and ROM     PATIENT EDUCATION:  Education details: Education provided anatomy of ankle and anatomy of core and how to engage. Instructed in below HEP. Person educated: Patient Education method: Explanation Education comprehension: verbalized understanding  HOME EXERCISE PROGRAM:  Access Code: [email or web address] URL: [email or web address] Date: 04/09/2024 Prepared by: Reyes London  Exercises - Supine Transversus Abdominis Bracing - Hands on Ground  - 3 x weekly - 2 sets - 10 reps - 5 hold - Supine Transversus Abdominis Bracing with Leg Extension  - 3 x weekly - 3 sets - 10 reps - Supine Bridge  - 3 x weekly - 3 sets - 10 reps - Clamshell  - 3 x weekly - 3 sets - 10 reps - Sidelying Hip Abduction  - 3 x weekly - 3 sets - 10 reps       Access Code: [email or web address] URL: [email or web address] Date: 03/19/2024 Prepared by: Reyes London  Exercises - Sidelying Ankle Eversion Strengthening with Ankle Weight  - 3 x weekly - 3 sets - 10 reps - Toe Yoga - Alternating Great Toe and Lesser Toe Extension  - 3 x weekly - 3 sets - 10 reps - Towel Scrunches  - 1 x daily - 3 x weekly - 3 sets - 10 reps - Toe Spreading  - 3 x weekly - 3 sets - 10 reps    Access Code: [email or web address]  URL: [email or web address]  Date: 03/12/2024  repared by: Marina  Moser  Exercises: - Seated Ankle Plantarflexion with Resistance  - 1 x daily - 7 x weekly - 2 sets - 10 reps - 5 hold  - Seated Ankle Eversion with Resistance  - 1 x daily - 7 x weekly - 2 sets - 10 reps - 5 hold  - Seated Ankle  Inversion with Resistance and Legs Crossed  - 1 x daily - 7 x weekly - 2 sets - 10 reps - 5 hold  - Seated Ankle Dorsiflexion with Resistance  - 1 x daily - 7 x weekly - 2 sets - 10 reps - 5 hold  - Seated Ankle Alphabet  - 1 x daily - 7 x weekly - 2 sets - 10 reps - 5 hold - Supine Posterior Pelvic Tilt  - 1 x daily - 7 x weekly - 2 sets - 10 reps - 5 hold  - Supine Bridge with  Pelvic Floor Contraction  - 1 x daily - 7 x weekly - 2 sets - 10 reps - 5 hold   ASSESSMENT:  CLINICAL IMPRESSION: Patient reporting ongoing bone pain LLE that is unrelieved with any stretch or mobilization. Unable to get any relief. Due to symptoms - advised her to reach out to her Ortho MD as she had remote hip replacement and may warrant - follow up to make sure no abnormalities. Today she was able to perform some core activation with LE movement and did not report any worsening of pain. She reported feeling better at end of session. Will defer all ankle goals as patient has decided (after conversation with MD to have surgical intervention) but likely not until October as she is going to have breast surgery in September. Pt would benefit from skilled PT intervention to address listed deficits and improve strength, balance, weight bearing tolerance, and overall mobility status.   OBJECTIVE IMPAIRMENTS: Abnormal gait, decreased activity tolerance, decreased balance, decreased endurance, decreased mobility, difficulty walking, decreased ROM, decreased strength, hypomobility, impaired perceived functional ability, impaired flexibility, improper body mechanics, postural dysfunction, and pain.   ACTIVITY LIMITATIONS: carrying, lifting, bending, sitting, standing, squatting, stairs, transfers, bed mobility, bathing, toileting, hygiene/grooming, and locomotion level  PARTICIPATION LIMITATIONS: meal prep, cleaning, laundry, driving, shopping, community activity, and occupation  PERSONAL FACTORS: Age, Past/current experiences, and  3+ comorbidities: anemia, anxiety, breast cancer of L breast, cervical cancer, chronic back pain, chronic hypotension, CKD (now clear), claustrophobia, decompression injury of spinal cord, depression, dizziness, failed spinal cord stimulator, hypercholesteremia, inappropriate sinus node tachycardia, migraine, morbid obesity, neurogenic orthostatic hypotension, OA of L hip, pneumonia, seizure, spinal cord stroke are also affecting patient's functional outcome.   REHAB POTENTIAL: Good  CLINICAL DECISION MAKING: Evolving/moderate complexity  EVALUATION COMPLEXITY: Moderate   GOALS: Goals reviewed with patient? Yes  SHORT TERM GOALS: Target date: March 18, 2024  1. Patient will be independent in home exercise program to improve strength/mobility for better functional independence with ADLs. Baseline: give next session  Goal status: INITIAL  LONG TERM GOALS: Target date: 04/22/2024   Pt will increase by at least 0.13 m/s in order to demonstrate clinically significant improvement in community ambulation.   Baseline: 0.74 without AD Goal status: INITIAL  2. Pt will decrease 5TSTS by at least 3 seconds in order to demonstrate clinically significant improvement in LE strength  Baseline: 24.8 seconds with UE use on armrests  Goal status: INITIAL  3.  Pt will decrease mODI scoreby at least 13 points in order demonstrate clinically significant reduction in pain/disability  Baseline: 36 / 50 = 72.0 % on 7/9 Goal status: INITIAL  4. Pt will increase by at least 71m (183ft) in order to demonstrate clinically significant improvement in cardiopulmonary endurance and community ambulation  Baseline: Assess next session; 03/19/24= 550 feet without AD Goal status: INITIAL   5.  Patient will increase Functional Gait Assessment score to >20/30 as to reduce fall risk and improve dynamic gait safety with community ambulation. Baseline: 13/30 on 7/9 Goal status: INITIAL  6. Patient will  increase BLE gross strength to 4+/5 as to improve functional strength for independent gait, increased standing tolerance and increased ADL ability.  Baseline: see MMT chart above  Goal status: INITIAL  PLAN:  PT FREQUENCY: 2x/week  PT DURATION: 8 weeks  PLANNED INTERVENTIONS: [id number]- PT Re-evaluation, 97750- Physical Performance Testing, 97110-Therapeutic exercises, 97530- Therapeutic activity, [id number]- Neuromuscular re-education, 97535- Self Care, 02859- Manual therapy, [id number]- Gait training, [phone]- Orthotic  Initial, 02236- Orthotic/Prosthetic subsequent, [phone]- Canalith repositioning, [id number]- Splinting, [id number]- Electrical stimulation (unattended), [phone]- Electrical stimulation (manual), [id number]- Ultrasound, [id number]- Traction (mechanical), [phone] (1-2 muscles), [phone] (3+ muscles)- Dry Needling, Patient/Family education, Balance training, Stair training, Taping, Joint mobilization, Joint manipulation, Spinal mobilization, Scar mobilization, Compression bandaging, Vestibular training, Visual/preceptual remediation/compensation, Cognitive remediation, DME instructions, Cryotherapy, Moist heat, and Biofeedback.  PLAN FOR NEXT SESSION: Continue with core/low back strengthening in seated and standing Review activities added to HEP and progress as appropriate.        Reyes LOISE London, PT 04/16/2024, 4:06 PM

## 2024-04-16 NOTE — Progress Notes (Signed)
 Subjective:  Patient ID: Vanessa Medina, female    DOB: 07-22-1971,  MRN: [id number]  Chief Complaint  Patient presents with   Foot Pain    Rm Patient is here to discuss MRI results for Peroneal tendinitis of right lower extremity.     History of Present Illness She returns for follow-up today for her right ankle pain after completing MRI is losing the boot and the change in symptoms still painful      Objective:    Physical Exam VASCULAR: DP and PT pulse palpable. Foot is warm and well-perfused. Capillary fill time is brisk. DERMATOLOGIC: Normal skin turgor, texture, and temperature. No open lesions, rashes, or ulcerations. NEUROLOGIC: Normal sensation to light touch and pressure. No paresthesias on examination. ORTHOPEDIC: Pain on palpation at the fifth metatarsal base, peroneus brevis insertion, right foot, radiates from the distal malleolus to the fifth metatarsal base. Pain with resisted eversion, right foot.  Pain at the fifth metatarsal base.  No pain on lateral plantar fascia.  Mild tenderness and swelling in anterior ankle joint   No images are attached to the encounter.    Results    RADIOLOGY Right foot radiograph: enthesopathy at the fifth metatarsal base (12/31/2023)   Narrative & Impression  CLINICAL DATA:  Right ankle pain for approximately 8 months. No known injury.   EXAM: MRI OF THE RIGHT ANKLE WITHOUT CONTRAST   TECHNIQUE: Multiplanar, multisequence MR imaging of the ankle was performed. No intravenous contrast was administered.   COMPARISON:  Plain films right foot 12/31/2023.   FINDINGS: TENDONS   Peroneal: There is a longitudinal split tearing of the peroneus brevis posterior to the lateral malleolus and along the mid calcaneus. The peroneus longus is unremarkable.   Posteromedial: Intact.   Anterior: Intact.   Achilles: Intact.   Plantar Fascia: Intact.  Negative for plantar fasciitis.   LIGAMENTS   Lateral: Intact.    Medial: Intact.   CARTILAGE   Ankle Joint: There is an osteochondral lesion of the medial talar dome measuring 0.5 cm transverse by 0.6 cm AP. Overlying cartilage is thinned and there is a small underlying subchondral cyst worrisome for fragment instability.   Subtalar Joints/Sinus Tarsi: Normal.   Bones: No fracture, stress change or worrisome lesion. Mild to moderate midfoot osteoarthritis appears worst at the fourth tarsometatarsal joint.   Other: None.   IMPRESSION: 1. Longitudinal split tearing of the peroneus brevis. 2. Osteochondral lesion of the medial talar dome with findings worrisome for fragment instability. 3. Mild to moderate midfoot osteoarthritis appears worst at the fourth tarsometatarsal joint.     Electronically Signed   By: Debby Prader M.D.   On: 04/07/2024 10:02    Assessment:   1. Peroneal tendon tear, right, initial encounter   2. Osteochondral defect of talus       Plan:  Patient was evaluated and treated and all questions answered.  Assessment and Plan Assessment & Plan Insertional peroneal tendonitis Patient returns for follow-up today with right peroneal tendon tear after completed the MRI.  She does note occasional swelling and pain in the anterior ankle joint as well especially medially.  We reviewed the results of the MRI we discussed the presence of the tear as well as the osteochondral defect.  She has had a few months now of physical therapy boot immobilization and has had little to no relief from this.  We discussed further treatment options including surgical intervention.  Surgical we discussed repair of the peroneal tendon tear and  ankle arthroscopy with debridement and possible repair of the osteochondral lesion if amenable to this approach.  We discussed the use of cartilage matrix to promote cartilage restoration.  We discussed this likely may not be a long-term permanent fix and may progress over time with ankle arthritis and  may need further treatment including further surgical intervention.  We discussed the risk benefits and potential complications of surgery including but not limited to pain, swelling, infection, scar, numbness which may be temporary or permanent, chronic pain, stiffness, nerve pain or damage, wound healing problems, bone healing problems including delayed or non-union.  All questions addressed.  Informed consent signed and reviewed in the office.  She has an upcoming breast reconstruction surgery and will likely schedule surgery following this procedure.    Surgical plan:  Procedure: - Right peroneal tendon tear with possible grafting, PRP injection, talar OCD repair with arthroscopy  Location: - [facility]  Anesthesia plan: - General With a regional block  Postoperative pain plan: - Tylenol  1000 mg every 6 hours, gabapentin  300 mg every 8 hours x5 days, oxycodone  5 mg 1-2 tabs every 6 hours only as needed  DVT prophylaxis: - Xarelto 10 mg nightly  WB Restrictions / DME needs: - Nonweightbearing in splint postop with knee scooter     No follow-ups on file.

## 2024-04-18 ENCOUNTER — Telehealth: Payer: Self-pay | Admitting: Podiatry

## 2024-04-18 NOTE — Telephone Encounter (Signed)
 Received surgical consent form.  Left message for pt to call to schedule her surgery.

## 2024-04-22 ENCOUNTER — Telehealth: Payer: Self-pay | Admitting: Podiatry

## 2024-04-22 NOTE — Telephone Encounter (Signed)
 Pt left message stating she was calling to schedule surgery.   I returned call and left a message for pt to call me back to get the surgery scheduled.

## 2024-04-23 ENCOUNTER — Ambulatory Visit

## 2024-04-23 DIAGNOSIS — R2681 Unsteadiness on feet: Secondary | ICD-10-CM

## 2024-04-23 DIAGNOSIS — R262 Difficulty in walking, not elsewhere classified: Secondary | ICD-10-CM

## 2024-04-23 DIAGNOSIS — M6281 Muscle weakness (generalized): Secondary | ICD-10-CM

## 2024-04-23 DIAGNOSIS — R2689 Other abnormalities of gait and mobility: Secondary | ICD-10-CM

## 2024-04-23 DIAGNOSIS — R278 Other lack of coordination: Secondary | ICD-10-CM

## 2024-04-23 DIAGNOSIS — M545 Low back pain, unspecified: Secondary | ICD-10-CM

## 2024-04-23 DIAGNOSIS — M5459 Other low back pain: Secondary | ICD-10-CM

## 2024-04-23 NOTE — Therapy (Signed)
 OUTPATIENT PHYSICAL THERAPY THORACOLUMBAR/ LOWER EXTREMITY TREATMENT/RECERT   Patient Name: Vanessa Medina MRN: [id number] DOB:[birth year], 53 y.o., female Today's Date: 04/23/2024  END OF SESSION:  PT End of Session - 04/23/24 1015     Visit Number 7    Number of Visits 31    Date for PT Re-Evaluation 07/16/24    Authorization Type 27 allowed visits    Progress Note Due on Visit 10    PT Start Time 1015    PT Stop Time 1059    PT Time Calculation (min) 44 min    Equipment Utilized During Treatment Gait belt    Activity Tolerance Patient tolerated treatment well;Treatment limited secondary to medical complications (Comment)    Behavior During Therapy WFL for tasks assessed/performed              Past Medical History:  Diagnosis Date   Acute nontraumatic kidney injury (HCC)    Anemia    Anxiety    Benign cyst of kidney    Breast cancer (HCC) 01/18/2023   left breast   Cervical cancer (HCC)    Chronic back pain    Chronic hypotension    Chronic kidney disease    Previous now clear   Claustrophobia    Decompression injury of spinal cord    Depression    Dizziness    Failed spinal cord stimulator (HCC)    Family history of adverse reaction to anesthesia      my mother takes a long time time wake up   History of dysplastic nevus 01/18/2018   right shoulder, recurrent dysplastic nevus   History of kidney stones    History of shingles    Hypercholesteremia    Inappropriate sinus node tachycardia (HCC)    Migraine    Morbid obesity (HCC)    Multiple allergies    Neurogenic orthostatic hypotension (HCC)    Obesity    Osteoarthritis    left hip   Pneumonia    PONV (postoperative nausea and vomiting)    delayed emergence   Seizure (HCC) 2021   x1-after a back surgery with spinal cord stimulator   Slow to wake up after anesthesia    Stroke (HCC) 08/11/2020   spinal cord stroke, spinal cord puncture during surgery   Wears glasses    Past Surgical  History:  Procedure Laterality Date   ABDOMINAL HYSTERECTOMY     APPENDECTOMY     BACK SURGERY     BILIOPANCREATIC DIVISION W DUODENAL SWITCH N/A    BREAST BIOPSY Left 12/06/2022   u/s bx,1:00 heart clip, path pending   BREAST BIOPSY Left 12/06/2022   us  bx 2:00 ribbon clip path pending   BREAST BIOPSY Left 12/06/2022   US  LT BREAST BX W LOC DEV EA ADD LESION IMG BX SPEC US  GUIDE 12/06/2022 [facility]   BREAST BIOPSY Left 12/06/2022   US  LT BREAST BX W LOC DEV 1ST LESION IMG BX SPEC US  GUIDE 12/06/2022 [facility]   BREAST RECONSTRUCTION WITH PLACEMENT OF TISSUE EXPANDER AND FLEX HD (ACELLULAR HYDRATED DERMIS) Bilateral 01/18/2023   Procedure: IMMEDIATE LEFT BREAST RECONSTRUCTION WITH PLACEMENT OF TISSUE EXPANDER AND FLEX HD (ACELLULAR HYDRATED DERMIS);  Surgeon: [name], DO;  Location: [facility];  Service: Plastics;  Laterality: Bilateral;   BREAST RECONSTRUCTION WITH PLACEMENT OF TISSUE EXPANDER AND FLEX HD (ACELLULAR HYDRATED DERMIS) Left 03/26/2023   Procedure: BREAST RECONSTRUCTION WITH PLACEMENT OF TISSUE EXPANDER;  Surgeon: [name], DO;  Location:  S[facility]ERY C[facility];  Service: Government social research officer;  Laterality: Left;   CHOLECYSTECTOMY     COLONOSCOPY N/A 06/27/2021   Procedure: COLONOSCOPY;  Surgeon: Onita Elspeth Sharper, DO;  Location: [facility];  Service: Gastroenterology;  Laterality: N/A;   COLONOSCOPY N/A 08/27/2023   Procedure: COLONOSCOPY;  Surgeon: Onita Elspeth Sharper, DO;  Location: [facility] ENDOSCOPY;  Service: Gastroenterology;  Laterality: N/A;   DIAGNOSTIC LAPAROSCOPY     LOA   ESOPHAGOGASTRODUODENOSCOPY N/A 06/27/2021   Procedure: ESOPHAGOGASTRODUODENOSCOPY (EGD);  Surgeon: Onita Elspeth Sharper, DO;  Location: [facility] ENDOSCOPY;  Service: Gastroenterology;  Laterality: N/A;   ESOPHAGOGASTRODUODENOSCOPY (EGD) WITH PROPOFOL  N/A 07/25/2017   Procedure: ESOPHAGOGASTRODUODENOSCOPY (EGD) WITH PROPOFOL ;  Surgeon: Viktoria Lamar DASEN, MD;  Location:  [facility] ENDOSCOPY;  Service: Endoscopy;  Laterality: N/A;   HERNIA REPAIR     KNEE ARTHROSCOPY Right    LAPAROSCOPIC GASTRIC SLEEVE RESECTION WITH HIATAL HERNIA REPAIR     LUMBAR FUSION  08/29/2022   Revision Lumbar two through five Laminectomy & Fusion with Transforaminal Lumbar interbody fusion   MASTECTOMY W/ SENTINEL NODE BIOPSY Bilateral 01/18/2023   Procedure: MASTECTOMY WITH SENTINEL LYMPH NODE BIOPSY, RNFA to assist;  Surgeon: Jordis Laneta FALCON, MD;  Location: [facility];  Service: General;  Laterality: Bilateral;   PULSE GENERATOR IMPLANT N/A 01/24/2024   Procedure: UNILATERAL PULSE GENERATOR IMPLANT;  Surgeon: [name] ORN, MD;  Location: [facility];  Service: Neurosurgery;  Laterality: N/A;  INTERNAL PULSE GENERATOR PLACEMENT FOR SPINAL CORD STIMULATOR   REMOVAL OF TISSUE EXPANDER AND PLACEMENT OF IMPLANT Bilateral 06/06/2023   Procedure: REMOVAL OF TISSUE EXPANDER AND PLACEMENT OF IMPLANT;  Surgeon: [name], DO;  Location: [facility];  Service: Plastics;  Laterality: Bilateral;   SPINAL CORD STIMULATOR IMPLANT     THORACIC LAMINECTOMY FOR SPINAL CORD STIMULATOR N/A 01/15/2024   Procedure: THORACIC LAMINECTOMY FOR SPINAL CORD STIMULATOR;  Surgeon: [name] ORN, MD;  Location: [facility];  Service: Neurosurgery;  Laterality: N/A;  THORACIC LAMINECTOMY FOR SPINAL CORD STIMULATOR PADDLE TRIAL   TONSILLECTOMY     TOTAL HIP ARTHROPLASTY Left 01/26/2016   Procedure: LEFT TOTAL HIP ARTHROPLASTY ANTERIOR APPROACH;  Surgeon: Kay CHRISTELLA Cummins, MD;  Location: [facility];  Service: Orthopedics;  Laterality: Left;   TRANSFORAMINAL LUMBAR INTERBODY FUSION (TLIF) WITH PEDICLE SCREW FIXATION 3 LEVEL  08/2019   Lynwood Better, MD L2-L5   Patient Active Problem List   Diagnosis Date Noted   Breast asymmetry following reconstructive surgery 03/25/2024   S/P insertion of spinal cord stimulator 03/21/2024   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   Pain and swelling of lower leg 09/25/2023    Lymphedema 09/25/2023   Long term (current) use of aromatase inhibitors 09/07/2023   At risk for loss of bone density 09/07/2023   Changing skin lesion 09/04/2023   Frequent falls 08/22/2023   Chronic pain syndrome 08/22/2023   Bilateral lower extremity edema 08/06/2023   Back pain 08/06/2023   Claustrophobia    Pernicious anemia 03/14/2023   Ruptured left breast implant 02/13/2023   S/P mastectomy, bilateral 01/18/2023   Genetic testing 12/18/2022   Breast cancer (HCC) 12/08/2022   Family history of breast cancer 12/08/2022   Profound fatigue 12/08/2022   Lumbar post-laminectomy syndrome 06/02/2022   Pseudarthrosis after fusion or arthrodesis 05/19/2022   Primary osteoarthritis of right knee 03/28/2022   Subjective tinnitus of both ears 12/21/2021   Abnormality of gait 04/29/2021   Abnormal sensation in both ears 04/06/2021   Other adverse food reactions, not elsewhere classified, subsequent encounter 04/06/2021  Multiple drug allergies 04/06/2021   Insomnia due to medical condition 02/25/2021   Myofascial muscle pain 02/25/2021   Nerve pain 10/18/2020   Incomplete paraplegia (HCC) 10/18/2020   Orthostatic hypotension 10/18/2020   Renal cyst 09/23/2020   Chronic migraine without aura without status migrainosus, not intractable    Hypokalemia    Adjustment reaction with anxiety and depression    Spinal cord injury, lumbar, without spinal bone injury, sequela (HCC) 08/18/2020   Paraparesis (HCC)    Post-operative pain    Hypotension    Slow transit constipation    AKI (acute kidney injury) (HCC)    Right leg weakness 08/11/2020   DDD (degenerative disc disease), lumbar 09/02/2019    Class: Chronic   Degenerative disc disease, lumbar 09/02/2019   Chest tightness 09/23/2018   Bradycardia 09/23/2018   BMI 40.0-44.9, adult (HCC) 04/17/2018   Status post bariatric surgery 04/17/2018   Labile blood pressure 03/08/2018   Chronic low back pain 09/03/2017   History of total  hip replacement, left 01/26/2016   Osteoarthritis of spine with radiculopathy, lumbar region 10/16/2014   LEG PAIN, BILATERAL 12/16/2007   Malignant neoplasm of cervix uteri (HCC) 07/02/2007   MORBID OBESITY 07/02/2007   DEPRESSION 07/02/2007   Migraine without aura 07/02/2007   Other allergic rhinitis 07/02/2007   Asthma 07/02/2007   GERD 07/02/2007   ELEVATED BLOOD PRESSURE WITHOUT DIAGNOSIS OF HYPERTENSION 07/02/2007   Asthma 07/02/2007    PCP: Lynwood Null, MD  REFERRING PROVIDER: Juliene Medicine, DPM; Lyle Decamp, PA-C  REFERRING DIAG:  [phone] (ICD-10-CM) - Peroneal tendinitis of right lower extremity   Z96.89 (ICD-10-CM) - S/P insertion of spinal cord stimulator   Rationale for Evaluation and Treatment: Rehabilitation  THERAPY DIAG:  Muscle weakness (generalized) - Plan: PT plan of care cert/re-cert  Chronic midline low back pain, unspecified whether sciatica present - Plan: PT plan of care cert/re-cert  Other low back pain - Plan: PT plan of care cert/re-cert  Difficulty in walking, not elsewhere classified - Plan: PT plan of care cert/re-cert  Other lack of coordination - Plan: PT plan of care cert/re-cert  Unsteadiness on feet - Plan: PT plan of care cert/re-cert  Other abnormalities of gait and mobility - Plan: PT plan of care cert/re-cert  ONSET DATE: Spinal cord stimulator placed on 01/24/24 Foot pain began around 6 months ago  SUBJECTIVE:                                                                                                                                                                                           SUBJECTIVE STATEMENT: Patient reports plans for R foot surgery  in December 2025. Reports fell on Friday and sore low back. Rates LBP=7/10. States feeling rough and moving slow. States she has not followed up with MD regarding Left hip pain.   PERTINENT HISTORY:  Pt is well-known to this clinic. Underwent placement for permanent spinal  cord stimulator on 01/24/24 after a successful trial. Pt with a history of diagnosed chronic pain syndrome and failed back surgery syndrome. Pt has a history of plantar fasciitis and prior foot surgeries and was seen by Podiatrist on 6/11 and was given diagnosis of peroneal tendonitis. Per Podiatry note: Insertional peroneal tendonitis. Has had some improvement with corticosteroid injection.  Recommended formal physical therapy.  Referral was sent.  Return in 6 weeks.  If no improvement recommend MRI Patient has noticed recent increase in hair loss, will return to oncology physician next week to address concerns.   PMH includes anemia, anxiety, breast cancer of L breast, cervical cancer, chronic back pain, chronic hypotension, CKD (now clear), claustrophobia, decompression injury of spinal cord, depression, dizziness, failed spinal cord stimulator, hypercholesteremia, inappropriate sinus node tachycardia, migraine, morbid obesity, neurogenic orthostatic hypotension, OA of L hip, pneumonia, seizure, spinal cord stroke  See chart for full past surgical history  PAIN: Reporting on R foot pain: Are you having pain? Yes: NPRS scale: 3/10 currently, 7/10 at worst, 1/10 Pain location: On the top/lateral aspect of her R foot Pain description: throbbing, stabbing Aggravating factors: walking after about 40 minutes Relieving factors: getting off of it, elevating it  PRECAUTIONS: Back - until 6 weeks post-op (July 3rd, 2025)- pt able to recall precautions without cueing: no bending, lifting, twisting   RED FLAGS: None   WEIGHT BEARING RESTRICTIONS: No  FALLS:  Has patient fallen in last 6 months? Yes. 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  DIAGNOSTIC FINDINGS:  Right foot radiograph: enthesopathy at the fifth metatarsal base (12/31/2023)   PATIENT SURVEYS:  mODI: 36 / 50 = 72.0 %  FADI: 24/50- 48%  COGNITION: Overall cognitive status: Within functional limits for tasks assessed     SENSATION: WFL  MUSCLE LENGTH: Deferred at eval due to back precautions Hamstrings:  Thomas test:   POSTURE: rounded shoulders and forward head  PALPATION: Tender on palpation of base of 5th metatarsal and along lateral aspect of R foot  LUMBAR ROM: *Not tested at eval due to back precautions*  AROM eval  Flexion   Extension   Right lateral flexion   Left lateral flexion   Right rotation   Left rotation    (Blank rows = not tested)  LOWER EXTREMITY ROM:     Active  Right eval Left eval  Ankle dorsiflexion 4 7  Ankle plantarflexion 42 50  Ankle inversion 15 25  Ankle eversion 5 15   (Blank rows = not tested)  LOWER EXTREMITY MMT:    MMT Right eval  Left eval  Hip flexion 4+ 4+  Hip extension    Hip abduction 4- 4-  Hip adduction 4+ 4+  Hip internal rotation    Hip external rotation    Knee flexion 4- 4-  Knee extension 3+ 4+  Ankle dorsiflexion 4- 4+  Ankle plantarflexion 4- 4+  Ankle inversion    Ankle eversion     (Blank rows = not tested)  LUMBAR SPECIAL TESTS:  Deferred due to back precautions  FUNCTIONAL TESTS:  5 times sit to stand: 24.80 seconds with UE use on armchair 10 meter walk test: 13.52seconds (0.74 m/s) FGA: 13/30 on 7/9  GAIT: Distance walked: approx. 143ft Assistive device utilized: None Level of  assistance: Modified independence Comments: Pt demonstrated step-through pattern throughout with decreased stance time on R LE and mild Trendelenburg compensatory gait pattern on R. Antalgic gait present throughout with narrowed BOS  TREATMENT DATE: 04/23/24   Deferred any ankle treatment - Patient going to have ankle Surgery in [place].    Low back- 7/10  Physical therapy treatment session today consisted of completing assessment of goals and administration of testing as demonstrated and documented in flow sheet, treatment, and goals section of this note. Addition treatments may be found below.   TA:  Core activation:  Abdominal core activation- seated hip march  x 10 reps (VC for breathing and activating core)  Seated  core activation- Hip abdx 10 reps each LE Seated lumbar flex with theraball x 25 reps for pain relief and ROM     PATIENT EDUCATION:  Education details: Education provided anatomy of ankle and anatomy of core and how to engage. Instructed in below HEP. Person educated: Patient Education method: Explanation Education comprehension: verbalized understanding  HOME EXERCISE PROGRAM:  Access Code: [email or web address] URL: [email or web address] Date: 04/09/2024 Prepared by: Reyes London  Exercises - Supine Transversus Abdominis Bracing - Hands on Ground  - 3 x weekly - 2 sets - 10 reps - 5 hold - Supine Transversus Abdominis Bracing with Leg Extension  - 3 x weekly - 3 sets - 10 reps - Supine Bridge  - 3 x weekly - 3 sets - 10 reps - Clamshell  - 3 x weekly - 3 sets - 10 reps - Sidelying Hip Abduction  - 3 x weekly - 3 sets - 10 reps       Access Code: [email or web address] URL: [email or web address] Date: 03/19/2024 Prepared by: Reyes London  Exercises - Sidelying Ankle Eversion Strengthening with Ankle Weight  - 3 x weekly - 3 sets - 10 reps - Toe Yoga - Alternating Great Toe and Lesser Toe Extension  - 3 x weekly - 3 sets - 10 reps -  Towel Scrunches  - 1 x daily - 3 x weekly - 3 sets - 10 reps - Toe Spreading  - 3 x weekly - 3 sets - 10 reps    Access Code: [email or web address]  URL: [email or web address]  Date: 03/12/2024  repared by: Marina  Moser  Exercises: - Seated Ankle Plantarflexion with Resistance  - 1 x daily - 7 x weekly - 2 sets - 10 reps - 5 hold  - Seated Ankle Eversion with Resistance  - 1 x daily - 7 x weekly - 2 sets - 10 reps - 5 hold  - Seated Ankle Inversion with Resistance and Legs Crossed  - 1 x daily - 7 x weekly - 2 sets - 10 reps - 5 hold  - Seated Ankle Dorsiflexion with Resistance  - 1 x daily -  7 x weekly - 2 sets - 10 reps - 5 hold  - Seated Ankle Alphabet  - 1 x daily - 7 x weekly - 2 sets - 10 reps - 5 hold - Supine Posterior Pelvic Tilt  - 1 x daily - 7 x weekly - 2 sets - 10 reps - 5 hold  - Supine Bridge with Pelvic Floor Contraction  - 1 x daily - 7 x weekly - 2 sets - 10 reps - 5 hold   ASSESSMENT:  CLINICAL IMPRESSION: Patient presents with ongoing low back pain and reassessed today. She does present with improving self perceived disability at seen by improved Score on Mod Oswestry. She continues to have low back pain and will benefit from further core activation and body mechanic/functional mobility strategies. She is limited by other medical issues- R peroneal tendon tear that will require surgery and currentl wearing boot. Due to this many of her original goals that are ambulatory related are not appropriate right now. Added some low back related goals and Patient's condition has the potential to improve in response to therapy. Maximum improvement is yet to be obtained. The anticipated improvement is attainable and reasonable in a generally predictable time. Pt would benefit from skilled PT intervention to address listed deficits and improve strength, balance, weight bearing tolerance, and overall mobility status.   OBJECTIVE IMPAIRMENTS: Abnormal gait, decreased activity tolerance,  decreased balance, decreased endurance, decreased mobility, difficulty walking, decreased ROM, decreased strength, hypomobility, impaired perceived functional ability, impaired flexibility, improper body mechanics, postural dysfunction, and pain.   ACTIVITY LIMITATIONS: carrying, lifting, bending, sitting, standing, squatting, stairs, transfers, bed mobility, bathing, toileting, hygiene/grooming, and locomotion level  PARTICIPATION LIMITATIONS: meal prep, cleaning, laundry, driving, shopping, community activity, and occupation  PERSONAL FACTORS: Age, Past/current experiences, and 3+ comorbidities: anemia, anxiety, breast cancer of L breast, cervical cancer, chronic back pain, chronic hypotension, CKD (now clear), claustrophobia, decompression injury of spinal cord, depression, dizziness, failed spinal cord stimulator, hypercholesteremia, inappropriate sinus node tachycardia, migraine, morbid obesity, neurogenic orthostatic hypotension, OA of L hip, pneumonia, seizure, spinal cord stroke are also affecting patient's functional outcome.   REHAB POTENTIAL: Good  CLINICAL DECISION MAKING: Evolving/moderate complexity  EVALUATION COMPLEXITY: Moderate   GOALS: Goals reviewed with patient? Yes  SHORT TERM GOALS: Target date: 05/24/2024  1. Patient will be independent in home exercise program to improve strength/mobility for better functional independence with ADLs. Baseline: give next session; 04/23/2024- Patient reports compliant with current LE stretching/ROM. Will continue with progressive core  Goal status: PROGRESSING  LONG TERM GOALS: Target date: 07/16/2024   Pt will increase by at least 0.13 m/s in order to demonstrate clinically significant improvement in community ambulation.   Baseline: 0.74 without AD Goal status: GOAL not appropriate right now due to known R tendon tear and going to have surgery in Dec.  2. Pt will decrease 5TSTS by at least 3 seconds in order to demonstrate  clinically significant improvement in LE strength  Baseline: 24.8 seconds with UE use on armrests  Goal status: GOAL not appropriate right now due to known R tendon tear and going to have surgery in Dec.  3.  Pt will decrease mODI scoreby at least 13 points in order demonstrate clinically significant reduction in pain/disability  Baseline: 36 / 50 = 72.0 % on 7/9; 04/23/2024= 60% Goal status: PROGRESSING  4. Pt will increase by at least 75m (146ft) in order to demonstrate clinically significant improvement in cardiopulmonary endurance and community ambulation  Baseline:  Assess next session; 03/19/24= 550 feet without AD Goal status: GOAL not appropriate right now due to known R tendon tear and going to have surgery in Dec.   5.  Patient will increase Functional Gait Assessment score to >20/30 as to reduce fall risk and improve dynamic gait safety with community ambulation. Baseline: 13/30 on 7/9 Goal status: GOAL not appropriate right now due to known R tendon tear and going to have surgery in Dec.  6. Patient will increase BLE gross strength to 4+/5 as to improve functional strength for independent gait, increased standing tolerance and increased ADL ability.  Baseline: see MMT chart above  Goal status: INITIAL  7. Pt will decrease worst back pain as reported on NPRS by at least 2 points in order to demonstrate clinically significant reduction in back pain.   Baseline: Current 7/10 on 04/23/2024  Goal status: New   8. Patient will verbalize and demonstrate proper body mechanics with homemaking related activities to decrease risk of injury.   Baseline: On restrictions as she has stimulator in spine. Patient reports limited by lifting precautions- limited knowledge of ways to protect spine.   Goal status: New    9. Patient will report sleeping > 4 hours/per night consistently- 5/7 nights per week for improved functioning and rest at night  Baseline: Patient reports having increased  difficulty sleeping more than 3-4 hours per night consistently  Goal status: New PLAN:  PT FREQUENCY: 1-2x/week  PT DURATION: 12 weeks  PLANNED INTERVENTIONS: [id number]- PT Re-evaluation, 97750- Physical Performance Testing, 97110-Therapeutic exercises, 97530- Therapeutic activity, [id number]- Neuromuscular re-education, 97535- Self Care, [id number]- Manual therapy, [id number]- Gait training, [id number]- Orthotic Initial, [id number]- Orthotic/Prosthetic subsequent, [phone]- Canalith repositioning, [id number]- Splinting, [id number]- Electrical stimulation (unattended), [phone]- Electrical stimulation (manual), [id number]- Ultrasound, [id number]- Traction (mechanical), [id number] (1-2 muscles), [id number] (3+ muscles)- Dry Needling, Patient/Family education, Balance training, Stair training, Taping, Joint mobilization, Joint manipulation, Spinal mobilization, Scar mobilization, Compression bandaging, Vestibular training, Visual/preceptual remediation/compensation, Cognitive remediation, DME instructions, Cryotherapy, Moist heat, and Biofeedback.  PLAN FOR NEXT SESSION: Continue with core/low back strengthening in seated and standing Review activities added to HEP and progress as appropriate.        Reyes LOISE London, PT 04/23/2024, 4:49 PM

## 2024-04-30 ENCOUNTER — Ambulatory Visit

## 2024-04-30 NOTE — Therapy (Incomplete)
 OUTPATIENT PHYSICAL THERAPY THORACOLUMBAR/ LOWER EXTREMITY TREATMENT   Patient Name: Vanessa Medina MRN: [id number] DOB:[birth year], 53 y.o., female Today's Date: 04/30/2024  END OF SESSION:        Past Medical History:  Diagnosis Date   Acute nontraumatic kidney injury (HCC)    Anemia    Anxiety    Benign cyst of kidney    Breast cancer (HCC) 01/18/2023   left breast   Cervical cancer (HCC)    Chronic back pain    Chronic hypotension    Chronic kidney disease    Previous now clear   Claustrophobia    Decompression injury of spinal cord    Depression    Dizziness    Failed spinal cord stimulator (HCC)    Family history of adverse reaction to anesthesia      my mother takes a long time time wake up   History of dysplastic nevus 01/18/2018   right shoulder, recurrent dysplastic nevus   History of kidney stones    History of shingles    Hypercholesteremia    Inappropriate sinus node tachycardia (HCC)    Migraine    Morbid obesity (HCC)    Multiple allergies    Neurogenic orthostatic hypotension (HCC)    Obesity    Osteoarthritis    left hip   Pneumonia    PONV (postoperative nausea and vomiting)    delayed emergence   Seizure (HCC) 2021   x1-after a back surgery with spinal cord stimulator   Slow to wake up after anesthesia    Stroke (HCC) 08/11/2020   spinal cord stroke, spinal cord puncture during surgery   Wears glasses    Past Surgical History:  Procedure Laterality Date   ABDOMINAL HYSTERECTOMY     APPENDECTOMY     BACK SURGERY     BILIOPANCREATIC DIVISION W DUODENAL SWITCH N/A    BREAST BIOPSY Left 12/06/2022   u/s bx,1:00 heart clip, path pending   BREAST BIOPSY Left 12/06/2022   us  bx 2:00 ribbon clip path pending   BREAST BIOPSY Left 12/06/2022   US  LT BREAST BX W LOC DEV EA ADD LESION IMG BX SPEC US  GUIDE 12/06/2022 [facility]   BREAST BIOPSY Left 12/06/2022   US  LT BREAST BX W LOC DEV 1ST LESION IMG BX SPEC US  GUIDE 12/06/2022  [facility]   BREAST RECONSTRUCTION WITH PLACEMENT OF TISSUE EXPANDER AND FLEX HD (ACELLULAR HYDRATED DERMIS) Bilateral 01/18/2023   Procedure: IMMEDIATE LEFT BREAST RECONSTRUCTION WITH PLACEMENT OF TISSUE EXPANDER AND FLEX HD (ACELLULAR HYDRATED DERMIS);  Surgeon: [name], DO;  Location: [facility];  Service: Plastics;  Laterality: Bilateral;   BREAST RECONSTRUCTION WITH PLACEMENT OF TISSUE EXPANDER AND FLEX HD (ACELLULAR HYDRATED DERMIS) Left 03/26/2023   Procedure: BREAST RECONSTRUCTION WITH PLACEMENT OF TISSUE EXPANDER;  Surgeon: [name], DO;  Location: [facility];  Service: Plastics;  Laterality: Left;   CHOLECYSTECTOMY     COLONOSCOPY N/A 06/27/2021   Procedure: COLONOSCOPY;  Surgeon: Onita Elspeth Sharper, DO;  Location: [facility];  Service: Gastroenterology;  Laterality: N/A;   COLONOSCOPY N/A 08/27/2023   Procedure: COLONOSCOPY;  Surgeon: Onita Elspeth Sharper, DO;  Location: [facility] ENDOSCOPY;  Service: Gastroenterology;  Laterality: N/A;   DIAGNOSTIC LAPAROSCOPY     LOA   ESOPHAGOGASTRODUODENOSCOPY N/A 06/27/2021   Procedure: ESOPHAGOGASTRODUODENOSCOPY (EGD);  Surgeon: Onita Elspeth Sharper, DO;  Location: [facility] ENDOSCOPY;  Service: Gastroenterology;  Laterality: N/A;   ESOPHAGOGASTRODUODENOSCOPY (EGD) WITH PROPOFOL  N/A 07/25/2017   Procedure: ESOPHAGOGASTRODUODENOSCOPY (EGD) WITH PROPOFOL ;  Surgeon: Viktoria Lamar DASEN, MD;  Location: [facility] ENDOSCOPY;  Service: Endoscopy;  Laterality: N/A;   HERNIA REPAIR     KNEE ARTHROSCOPY Right    LAPAROSCOPIC GASTRIC SLEEVE RESECTION WITH HIATAL HERNIA REPAIR     LUMBAR FUSION  08/29/2022   Revision Lumbar two through five Laminectomy & Fusion with Transforaminal Lumbar interbody fusion   MASTECTOMY W/ SENTINEL NODE BIOPSY Bilateral 01/18/2023   Procedure: MASTECTOMY WITH SENTINEL LYMPH NODE BIOPSY, RNFA to assist;  Surgeon: Jordis Laneta FALCON, MD;  Location: [facility];  Service: General;  Laterality:  Bilateral;   PULSE GENERATOR IMPLANT N/A 01/24/2024   Procedure: UNILATERAL PULSE GENERATOR IMPLANT;  Surgeon: [name] ORN, MD;  Location: [facility];  Service: Neurosurgery;  Laterality: N/A;  INTERNAL PULSE GENERATOR PLACEMENT FOR SPINAL CORD STIMULATOR   REMOVAL OF TISSUE EXPANDER AND PLACEMENT OF IMPLANT Bilateral 06/06/2023   Procedure: REMOVAL OF TISSUE EXPANDER AND PLACEMENT OF IMPLANT;  Surgeon: [name], DO;  Location: [facility];  Service: Plastics;  Laterality: Bilateral;   SPINAL CORD STIMULATOR IMPLANT     THORACIC LAMINECTOMY FOR SPINAL CORD STIMULATOR N/A 01/15/2024   Procedure: THORACIC LAMINECTOMY FOR SPINAL CORD STIMULATOR;  Surgeon: [name] ORN, MD;  Location: [facility];  Service: Neurosurgery;  Laterality: N/A;  THORACIC LAMINECTOMY FOR SPINAL CORD STIMULATOR PADDLE TRIAL   TONSILLECTOMY     TOTAL HIP ARTHROPLASTY Left 01/26/2016   Procedure: LEFT TOTAL HIP ARTHROPLASTY ANTERIOR APPROACH;  Surgeon: Kay CHRISTELLA Cummins, MD;  Location: [facility];  Service: Orthopedics;  Laterality: Left;   TRANSFORAMINAL LUMBAR INTERBODY FUSION (TLIF) WITH PEDICLE SCREW FIXATION 3 LEVEL  08/2019   Lynwood Better, MD L2-L5   Patient Active Problem List   Diagnosis Date Noted   Breast asymmetry following reconstructive surgery 03/25/2024   S/P insertion of spinal cord stimulator 03/21/2024   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   Pain and swelling of lower leg 09/25/2023   Lymphedema 09/25/2023   Long term (current) use of aromatase inhibitors 09/07/2023   At risk for loss of bone density 09/07/2023   Changing skin lesion 09/04/2023   Frequent falls 08/22/2023   Chronic pain syndrome 08/22/2023   Bilateral lower extremity edema 08/06/2023   Back pain 08/06/2023   Claustrophobia    Pernicious anemia 03/14/2023   Ruptured left breast implant 02/13/2023   S/P mastectomy, bilateral 01/18/2023   Genetic testing 12/18/2022   Breast cancer (HCC) 12/08/2022   Family  history of breast cancer 12/08/2022   Profound fatigue 12/08/2022   Lumbar post-laminectomy syndrome 06/02/2022   Pseudarthrosis after fusion or arthrodesis 05/19/2022   Primary osteoarthritis of right knee 03/28/2022   Subjective tinnitus of both ears 12/21/2021   Abnormality of gait 04/29/2021   Abnormal sensation in both ears 04/06/2021   Other adverse food reactions, not elsewhere classified, subsequent encounter 04/06/2021   Multiple drug allergies 04/06/2021   Insomnia due to medical condition 02/25/2021   Myofascial muscle pain 02/25/2021   Nerve pain 10/18/2020   Incomplete paraplegia (HCC) 10/18/2020   Orthostatic hypotension 10/18/2020   Renal cyst 09/23/2020   Chronic migraine without aura without status migrainosus, not intractable    Hypokalemia    Adjustment reaction with anxiety and depression    Spinal cord injury, lumbar, without spinal bone injury, sequela (HCC) 08/18/2020   Paraparesis (HCC)    Post-operative pain    Hypotension    Slow transit constipation    AKI (acute kidney injury) (HCC)    Right leg weakness  08/11/2020   DDD (degenerative disc disease), lumbar 09/02/2019    Class: Chronic   Degenerative disc disease, lumbar 09/02/2019   Chest tightness 09/23/2018   Bradycardia 09/23/2018   BMI 40.0-44.9, adult (HCC) 04/17/2018   Status post bariatric surgery 04/17/2018   Labile blood pressure 03/08/2018   Chronic low back pain 09/03/2017   History of total hip replacement, left 01/26/2016   Osteoarthritis of spine with radiculopathy, lumbar region 10/16/2014   LEG PAIN, BILATERAL 12/16/2007   Malignant neoplasm of cervix uteri (HCC) 07/02/2007   MORBID OBESITY 07/02/2007   DEPRESSION 07/02/2007   Migraine without aura 07/02/2007   Other allergic rhinitis 07/02/2007   Asthma 07/02/2007   GERD 07/02/2007   ELEVATED BLOOD PRESSURE WITHOUT DIAGNOSIS OF HYPERTENSION 07/02/2007   Asthma 07/02/2007    PCP: Lynwood Null, MD  REFERRING PROVIDER: Juliene Medicine, DPM; Lyle Decamp, PA-C  REFERRING DIAG:  [phone] (ICD-10-CM) - Peroneal tendinitis of right lower extremity   Z96.89 (ICD-10-CM) - S/P insertion of spinal cord stimulator   Rationale for Evaluation and Treatment: Rehabilitation  THERAPY DIAG:  No diagnosis found.  ONSET DATE: Spinal cord stimulator placed on 01/24/24 Foot pain began around 6 months ago  SUBJECTIVE:                                                                                                                                                                                           SUBJECTIVE STATEMENT: *** Patient reports plans for R foot surgery in December 2025. Reports fell on Friday and sore low back. Rates LBP=7/10. States feeling rough and moving slow. States she has not followed up with MD regarding Left hip pain.   PERTINENT HISTORY:  Pt is well-known to this clinic. Underwent placement for permanent spinal cord stimulator on 01/24/24 after a successful trial. Pt with a history of diagnosed chronic pain syndrome and failed back surgery syndrome. Pt has a history of plantar fasciitis and prior foot surgeries and was seen by Podiatrist on 6/11 and was given diagnosis of peroneal tendonitis. Per Podiatry note: Insertional peroneal tendonitis. Has had some improvement with corticosteroid injection.  Recommended formal physical therapy.  Referral was sent.  Return in 6 weeks.  If no improvement recommend MRI Patient has noticed recent increase in hair loss, will return to oncology physician next week to address concerns.   PMH includes anemia, anxiety, breast cancer of L breast, cervical cancer, chronic back pain, chronic hypotension, CKD (now clear), claustrophobia, decompression injury of spinal cord, depression, dizziness, failed spinal cord stimulator, hypercholesteremia, inappropriate sinus node tachycardia, migraine, morbid obesity, neurogenic orthostatic hypotension, OA of L hip, pneumonia, seizure,  spinal cord stroke  See chart for full past surgical history  PAIN: Reporting on R foot pain: Are you having pain? Yes: NPRS scale: 3/10 currently, 7/10 at worst, 1/10 Pain location: On the top/lateral aspect of her R foot Pain description: throbbing, stabbing Aggravating factors: walking after about 40 minutes Relieving factors: getting off of it, elevating it  PRECAUTIONS: Back - until 6 weeks post-op (July 3rd, 2025)- pt able to recall precautions without cueing: no bending, lifting, twisting   RED FLAGS: None   WEIGHT BEARING RESTRICTIONS: No  FALLS:  Has patient fallen in last 6 months? Yes. 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  DIAGNOSTIC FINDINGS:  Right foot radiograph: enthesopathy at the fifth metatarsal base (12/31/2023)   PATIENT SURVEYS:  mODI: 36 / 50 = 72.0 %  FADI: 24/50- 48%  COGNITION: Overall cognitive status: Within functional limits for tasks assessed     SENSATION: WFL  MUSCLE LENGTH: Deferred at eval due to back precautions Hamstrings:  Thomas test:   POSTURE: rounded shoulders and forward head  PALPATION: Tender on palpation of base of 5th metatarsal and along lateral aspect of R foot  LUMBAR ROM: *Not tested at eval due to back precautions*  AROM eval  Flexion   Extension   Right lateral flexion   Left  lateral flexion   Right rotation   Left rotation    (Blank rows = not tested)  LOWER EXTREMITY ROM:     Active  Right eval Left eval  Ankle dorsiflexion 4 7  Ankle plantarflexion 42 50  Ankle inversion 15 25  Ankle eversion 5 15   (Blank rows = not tested)  LOWER EXTREMITY MMT:    MMT Right eval Left eval  Hip flexion 4+ 4+  Hip extension    Hip abduction 4- 4-  Hip adduction 4+ 4+  Hip internal rotation    Hip external rotation    Knee flexion 4- 4-  Knee extension 3+ 4+  Ankle dorsiflexion 4- 4+  Ankle plantarflexion 4- 4+  Ankle inversion    Ankle eversion     (Blank rows = not tested)  LUMBAR SPECIAL TESTS:  Deferred due to back precautions  FUNCTIONAL TESTS:  5 times sit to stand: 24.80 seconds with UE use on armchair 10 meter walk test: 13.52seconds (0.74 m/s) FGA: 13/30 on 7/9  GAIT: Distance walked: approx. 134ft Assistive device utilized: None Level of assistance: Modified independence Comments: Pt demonstrated step-through pattern throughout with decreased stance time on R LE and mild Trendelenburg compensatory gait pattern on R. Antalgic gait present throughout with narrowed BOS  TREATMENT DATE: 04/30/24   Deferred any ankle treatment - Patient going to have ankle Surgery in [place].    Low back- 7/10  Physical therapy treatment session today consisted of completing assessment of goals and administration of testing as demonstrated and documented  in flow sheet, treatment, and goals section of this note. Addition treatments may be found below.   TA:  Core activation:  Abdominal core activation- seated hip march  x 10 reps (VC for breathing and activating core)  Seated  core activation- Hip abdx 10 reps each LE Seated lumbar flex with theraball x 25 reps for pain relief and ROM     PATIENT EDUCATION:  Education details: Education provided anatomy of ankle and anatomy of core and how to engage. Instructed in below HEP. Person educated:  Patient Education method: Explanation Education comprehension: verbalized understanding  HOME EXERCISE PROGRAM:  Access Code: [email or web address] URL: [email or web address] Date: 04/09/2024 Prepared by: Reyes London  Exercises - Supine Transversus Abdominis Bracing - Hands on Ground  - 3 x weekly - 2 sets - 10 reps - 5 hold - Supine Transversus Abdominis Bracing with Leg Extension  - 3 x weekly - 3 sets - 10 reps - Supine Bridge  - 3 x weekly - 3 sets - 10 reps - Clamshell  - 3 x weekly - 3 sets - 10 reps - Sidelying Hip Abduction  - 3 x weekly - 3 sets - 10 reps       Access Code: [email or web address] URL: [email or web address] Date: 03/19/2024 Prepared by: Reyes London  Exercises - Sidelying Ankle Eversion Strengthening with Ankle Weight  - 3 x weekly - 3 sets - 10 reps - Toe Yoga - Alternating Great Toe and Lesser Toe Extension  - 3 x weekly - 3 sets - 10 reps - Towel Scrunches  - 1 x daily - 3 x weekly - 3 sets - 10 reps - Toe Spreading  - 3 x weekly - 3 sets - 10 reps    Access Code: [email or web address]  URL: [email or web address]  Date: 03/12/2024  repared by: Marina  Moser  Exercises: - Seated Ankle Plantarflexion with Resistance  - 1 x daily - 7 x weekly - 2 sets - 10 reps - 5 hold  - Seated Ankle Eversion with Resistance  - 1 x daily - 7 x weekly - 2 sets - 10 reps - 5 hold  - Seated Ankle Inversion with Resistance and Legs Crossed  - 1 x daily - 7 x weekly - 2 sets - 10 reps - 5 hold  - Seated Ankle Dorsiflexion with Resistance  - 1 x daily - 7 x weekly - 2 sets - 10 reps - 5 hold  - Seated Ankle Alphabet  - 1 x daily - 7 x weekly - 2 sets - 10 reps - 5 hold - Supine Posterior Pelvic Tilt  - 1 x daily - 7 x weekly - 2 sets - 10 reps - 5 hold  - Supine Bridge with Pelvic Floor Contraction  - 1 x daily - 7 x weekly - 2 sets - 10 reps - 5 hold   ASSESSMENT:  CLINICAL IMPRESSION: Patient presents with ongoing low back pain and reassessed  today. She does present with improving self perceived disability at seen by improved Score on Mod Oswestry. She continues to have low back pain and will benefit from further core activation and body mechanic/functional mobility strategies. She is limited by other medical issues- R peroneal tendon tear that will require surgery and currentl wearing boot. Due to this many of her original goals that are ambulatory related are not appropriate right now. Added some low back related goals and Patient's condition has the potential to improve in response to therapy. Maximum improvement is yet to be obtained.  The anticipated improvement is attainable and reasonable in a generally predictable time. Pt would benefit from skilled PT intervention to address listed deficits and improve strength, balance, weight bearing tolerance, and overall mobility status.   OBJECTIVE IMPAIRMENTS: Abnormal gait, decreased activity tolerance, decreased balance, decreased endurance, decreased mobility, difficulty walking, decreased ROM, decreased strength, hypomobility, impaired perceived functional ability, impaired flexibility, improper body mechanics, postural dysfunction, and pain.   ACTIVITY LIMITATIONS: carrying, lifting, bending, sitting, standing, squatting, stairs, transfers, bed mobility, bathing, toileting, hygiene/grooming, and locomotion level  PARTICIPATION LIMITATIONS: meal prep, cleaning, laundry, driving, shopping, community activity, and occupation  PERSONAL FACTORS: Age, Past/current experiences, and 3+ comorbidities: anemia, anxiety, breast cancer of L breast, cervical cancer, chronic back pain, chronic hypotension, CKD (now clear), claustrophobia, decompression injury of spinal cord, depression, dizziness, failed spinal cord stimulator, hypercholesteremia, inappropriate sinus node tachycardia, migraine, morbid obesity, neurogenic orthostatic hypotension, OA of L hip, pneumonia, seizure, spinal cord stroke are also  affecting patient's functional outcome.   REHAB POTENTIAL: Good  CLINICAL DECISION MAKING: Evolving/moderate complexity  EVALUATION COMPLEXITY: Moderate   GOALS: Goals reviewed with patient? Yes  SHORT TERM GOALS: Target date: 05/24/2024  1. Patient will be independent in home exercise program to improve strength/mobility for better functional independence with ADLs. Baseline: give next session; 04/23/2024- Patient reports compliant with current LE stretching/ROM. Will continue with progressive core  Goal status: PROGRESSING  LONG TERM GOALS: Target date: 07/16/2024   Pt will increase by at least 0.13 m/s in order to demonstrate clinically significant improvement in community ambulation.   Baseline: 0.74 without AD Goal status: GOAL not appropriate right now due to known R tendon tear and going to have surgery in Dec.  2. Pt will decrease 5TSTS by at least 3 seconds in order to demonstrate clinically significant improvement in LE strength  Baseline: 24.8 seconds with UE use on armrests  Goal status: GOAL not appropriate right now due to known R tendon tear and going to have surgery in Dec.  3.  Pt will decrease mODI scoreby at least 13 points in order demonstrate clinically significant reduction in pain/disability  Baseline: 36 / 50 = 72.0 % on 7/9; 04/23/2024= 60% Goal status: PROGRESSING  4. Pt will increase by at least 38m (115ft) in order to demonstrate clinically significant improvement in cardiopulmonary endurance and community ambulation  Baseline: Assess next session; 03/19/24= 550 feet without AD Goal status: GOAL not appropriate right now due to known R tendon tear and going to have surgery in Dec.   5.  Patient will increase Functional Gait Assessment score to >20/30 as to reduce fall risk and improve dynamic gait safety with community ambulation. Baseline: 13/30 on 7/9 Goal status: GOAL not appropriate right now due to known R tendon tear and going to have  surgery in Dec.  6. Patient will increase BLE gross strength to 4+/5 as to improve functional strength for independent gait, increased standing tolerance and increased ADL ability.  Baseline: see MMT chart above  Goal status: INITIAL  7. Pt will decrease worst back pain as reported on NPRS by at least 2 points in order to demonstrate clinically significant reduction in back pain.   Baseline: Current 7/10 on 04/23/2024  Goal status: New   8. Patient will verbalize and demonstrate proper body mechanics with homemaking related activities to decrease risk of injury.   Baseline: On restrictions as she has stimulator in spine. Patient reports limited by lifting precautions- limited knowledge of ways to protect spine.  Goal status: New    9. Patient will report sleeping > 4 hours/per night consistently- 5/7 nights per week for improved functioning and rest at night  Baseline: Patient reports having increased difficulty sleeping more than 3-4 hours per night consistently  Goal status: New PLAN:  PT FREQUENCY: 1-2x/week  PT DURATION: 12 weeks  PLANNED INTERVENTIONS: [id number]- PT Re-evaluation, 97750- Physical Performance Testing, 97110-Therapeutic exercises, 97530- Therapeutic activity, [id number]- Neuromuscular re-education, 97535- Self Care, [id number]- Manual therapy, [id number]- Gait training, [id number]- Orthotic Initial, [id number]- Orthotic/Prosthetic subsequent, [phone]- Canalith repositioning, [id number]- Splinting, [id number]- Electrical stimulation (unattended), [phone]- Electrical stimulation (manual), [id number]- Ultrasound, [id number]- Traction (mechanical), [id number] (1-2 muscles), [id number] (3+ muscles)- Dry Needling, Patient/Family education, Balance training, Stair training, Taping, Joint mobilization, Joint manipulation, Spinal mobilization, Scar mobilization, Compression bandaging, Vestibular training, Visual/preceptual remediation/compensation, Cognitive remediation, DME instructions, Cryotherapy, Moist heat, and Biofeedback.  PLAN FOR  NEXT SESSION: Continue with core/low back strengthening in seated and standing Review activities added to HEP and progress as appropriate.        Reyes LOISE London, PT 04/30/2024, 7:34 AM

## 2024-05-01 ENCOUNTER — Ambulatory Visit (INDEPENDENT_AMBULATORY_CARE_PROVIDER_SITE_OTHER): Admitting: Orthopaedic Surgery

## 2024-05-01 ENCOUNTER — Encounter: Payer: Self-pay | Admitting: Physician Assistant

## 2024-05-01 ENCOUNTER — Ambulatory Visit (INDEPENDENT_AMBULATORY_CARE_PROVIDER_SITE_OTHER): Admitting: Physician Assistant

## 2024-05-01 ENCOUNTER — Other Ambulatory Visit (INDEPENDENT_AMBULATORY_CARE_PROVIDER_SITE_OTHER): Payer: Self-pay

## 2024-05-01 VITALS — BP 123/68 | HR 65 | Resp 99 | Ht 64.0 in | Wt 260.0 lb

## 2024-05-01 DIAGNOSIS — M25552 Pain in left hip: Secondary | ICD-10-CM

## 2024-05-01 DIAGNOSIS — N651 Disproportion of reconstructed breast: Secondary | ICD-10-CM

## 2024-05-01 DIAGNOSIS — C50812 Malignant neoplasm of overlapping sites of left female breast: Secondary | ICD-10-CM

## 2024-05-01 DIAGNOSIS — Z170 Estrogen receptor positive status [ER+]: Secondary | ICD-10-CM

## 2024-05-01 MED ORDER — BUPIVACAINE HCL 0.5 % IJ SOLN
3.0000 mL | INTRAMUSCULAR | Status: AC | PRN
Start: 2024-05-01 — End: 2024-05-01
  Administered 2024-05-01: 3 mL via INTRA_ARTICULAR

## 2024-05-01 MED ORDER — CEPHALEXIN 500 MG PO CAPS: 500.0000 mg | ORAL_CAPSULE | Freq: Four times a day (QID) | ORAL | 0 refills | Status: AC

## 2024-05-01 MED ORDER — LIDOCAINE HCL 1 % IJ SOLN
3.0000 mL | INTRAMUSCULAR | Status: AC | PRN
  Administered 2024-05-01: 3 mL

## 2024-05-01 MED ORDER — HYDROCODONE-ACETAMINOPHEN 5-325 MG PO TABS: 1.0000 | ORAL_TABLET | Freq: Three times a day (TID) | ORAL | 0 refills | Status: AC | PRN

## 2024-05-01 MED ORDER — METHYLPREDNISOLONE ACETATE 40 MG/ML IJ SUSP
40.0000 mg | INTRAMUSCULAR | Status: AC | PRN
  Administered 2024-05-01: 40 mg via INTRA_ARTICULAR

## 2024-05-01 MED ORDER — ONDANSETRON 4 MG PO TBDP: 4.0000 mg | ORAL_TABLET | Freq: Three times a day (TID) | ORAL | 0 refills | Status: DC | PRN

## 2024-05-01 NOTE — H&P (View-Only) (Signed)
 Patient ID: Vanessa Medina, female    DOB: 1971/07/21, 53 y.o.   MRN: [id number]  Chief Complaint  Patient presents with   Pre-op Exam      ICD-10-CM   1. Malignant neoplasm of overlapping sites of left breast in female, estrogen receptor positive (HCC)  C50.812    Z17.0     2. Breast asymmetry following reconstructive surgery  N65.1        History of Present Illness: Vanessa Medina is a 53 y.o.  female  with a history of bilateral breast reconstruction.  She presents for preoperative evaluation for upcoming procedure, abdominal liposuction and fat grafting to bilateral reconstructed breasts, scheduled for 05/21/2024 with Dr. Lowery.  The patient has not had problems with anesthesia.  She does not smoke or use nicotine-containing products.  Endorses family history of DVT (brother), but no personal history of blood clots or clotting disorder.  Endorses spider veins, no varicosities.  History of breast cancer, but no longer on chemotherapy.  She has a severe skin sensitivity and has had profound tape dermatitis in the past.  No Steri-Strips or Dermabond to be used.  She can only use the silicone bordered Mepilex dressings without issue.  Latex allergy, as well.  She arrived in a cam walker today and reports that she is having right ankle surgery in November.  She is still able to ambulate.  Summary of Previous Visit: She was seen recently here in clinic on 03/26/2024.  At that time, she was following up after bilateral 650 cc implant placement 06/2023.  Patient expressed concern about a bit of rippling and asymmetry and was interested in fat grafting for improved symmetry and contour.  Plan for bilateral fat grafting.  Job: Retired.  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  She will hold her NSAIDs 5 days prior to surgery as well as her multivitamins.   Past Medical  History: Allergies: Allergies  Allergen Reactions   Bee Venom Swelling   Iodinated Contrast Media Anaphylaxis    Swelling  Other reaction(s): Other (See Comments)  Swelling   Swelling   Latex Hives and Rash   Morphine  Other (See Comments)    BRADYCARDIA   Shellfish Allergy Anaphylaxis   Codeine  Nausea And Vomiting   Meperidine Hcl Other (See Comments)    BRADYCARDIA   Bee Pollen Other (See Comments)    Unknown   Oxycodone  Hives and Itching    Blisters on back   Tape Hives    Adhesive   Pentazocine Lactate Nausea And Vomiting    REACTION: vomiting with Talwin NX   Propoxyphene Nausea Only and Nausea And Vomiting    Current Medications:  Current Outpatient Medications:    ALPRAZolam  (XANAX ) 0.25 MG tablet, Take 0.25 mg by mouth at bedtime as needed for sleep., Disp: , Rfl:    azelastine (ASTELIN) 0.1 % nasal spray, Place 1 spray into both nostrils 2 (two) times daily as needed for allergies. Use in each nostril as directed, Disp: , Rfl:    baclofen  (LIORESAL ) 10 MG tablet, Take 1 tablet (10 mg total) by mouth 3 (three) times daily. - for spasms, Disp: 270 tablet, Rfl: 1   Calcium  Carb-Cholecalciferol (CALCIUM +D3 PO), Take 1 tablet by mouth in the morning and at bedtime., Disp: , Rfl:    colestipol (COLESTID) 1 g tablet, Take 1 g by mouth at bedtime., Disp: , Rfl:  diclofenac  Sodium (VOLTAREN ) 1 % GEL, Apply 2 g topically 4 (four) times daily as needed (pain)., Disp: 300 g, Rfl: 1   DULoxetine  (CYMBALTA ) 60 MG capsule, Take 2 capsules (120 mg total) by mouth at bedtime., Disp: 180 capsule, Rfl: 3   EPINEPHrine  0.3 mg/0.3 mL IJ SOAJ injection, Inject 0.3 mg into the muscle as needed for anaphylaxis., Disp: , Rfl:    fluticasone  (FLONASE ) 50 MCG/ACT nasal spray, Place 2 sprays into both nostrils daily as needed for allergies., Disp: , Rfl:    furosemide (LASIX) 20 MG tablet, Take 20 mg by mouth at bedtime as needed for fluid or edema., Disp: , Rfl:    gabapentin  (NEURONTIN ) 300  MG capsule, Take 1 capsule (300 mg total) by mouth at bedtime. For nerve pain, Disp: 90 capsule, Rfl: 1   letrozole  (FEMARA ) 2.5 MG tablet, Take 2.5 mg by mouth daily., Disp: , Rfl:    levothyroxine (SYNTHROID) 25 MCG tablet, Take 25 mcg by mouth daily before breakfast., Disp: , Rfl:    meloxicam  (MOBIC ) 7.5 MG tablet, TAKE 1-2 BY MOUTH EVERY DAY WITH FOOD., Disp: 60 tablet, Rfl: 0   methocarbamol  (ROBAXIN ) 500 MG tablet, Take 1 tablet (500 mg total) by mouth every 8 (eight) hours as needed for muscle spasms., Disp: 90 tablet, Rfl: 1   montelukast  (SINGULAIR ) 10 MG tablet, TAKE 1 TABLET BY MOUTH AT  BEDTIME FOR ALLERGIES, Disp: 30 tablet, Rfl: 1   Multiple Vitamin (MULTIVITAMIN WITH MINERALS) TABS tablet, Take 1 tablet by mouth 2 (two) times daily., Disp: , Rfl:    pramipexole  (MIRAPEX ) 0.125 MG tablet, Take 0.5 mg by mouth at bedtime., Disp: , Rfl:    pyridOXINE (VITAMIN B-6) 100 MG tablet, Take 1 tablet (100 mg total) by mouth daily., Disp: 90 tablet, Rfl: 1   thiamine 250 MG tablet, Take 250 mg by mouth at bedtime., Disp: , Rfl:    tolterodine  (DETROL  LA) 4 MG 24 hr capsule, Take 1 capsule (4 mg total) by mouth every evening., Disp: 90 capsule, Rfl: 3   topiramate  (TOPAMAX ) 100 MG tablet, TAKE 1 TABLET BY MOUTH AT  BEDTIME, Disp: 90 tablet, Rfl: 1   vitamin B-12 (CYANOCOBALAMIN ) 1000 MCG tablet, Take 1 tablet (1,000 mcg total) by mouth daily., Disp: 90 tablet, Rfl: 1   docusate sodium  (COLACE) 100 MG capsule, Take 1 capsule (100 mg total) by mouth 2 (two) times daily., Disp: 10 capsule, Rfl: 0   hydrOXYzine  (ATARAX ) 10 MG tablet, Take 1 tablet (10 mg total) by mouth 3 (three) times daily as needed., Disp: 30 tablet, Rfl: 0  Past Medical Problems: Past Medical History:  Diagnosis Date   Acute nontraumatic kidney injury (HCC)    Anemia    Anxiety    Benign cyst of kidney    Breast cancer (HCC) 01/18/2023   left breast   Cervical cancer (HCC)    Chronic back pain    Chronic hypotension     Chronic kidney disease    Previous now clear   Claustrophobia    Decompression injury of spinal cord    Depression    Dizziness    Failed spinal cord stimulator (HCC)    Family history of adverse reaction to anesthesia      my mother takes a long time time wake up   History of dysplastic nevus 01/18/2018   right shoulder, recurrent dysplastic nevus   History of kidney stones    History of shingles    Hypercholesteremia  Inappropriate sinus node tachycardia (HCC)    Migraine    Morbid obesity (HCC)    Multiple allergies    Neurogenic orthostatic hypotension (HCC)    Obesity    Osteoarthritis    left hip   Pneumonia    PONV (postoperative nausea and vomiting)    delayed emergence   Seizure (HCC) 2021   x1-after a back surgery with spinal cord stimulator   Slow to wake up after anesthesia    Stroke (HCC) 08/11/2020   spinal cord stroke, spinal cord puncture during surgery   Wears glasses     Past Surgical History: Past Surgical History:  Procedure Laterality Date   ABDOMINAL HYSTERECTOMY     APPENDECTOMY     BACK SURGERY     BILIOPANCREATIC DIVISION W DUODENAL SWITCH N/A    BREAST BIOPSY Left 12/06/2022   u/s bx,1:00 heart clip, path pending   BREAST BIOPSY Left 12/06/2022   us  bx 2:00 ribbon clip path pending   BREAST BIOPSY Left 12/06/2022   US  LT BREAST BX W LOC DEV EA ADD LESION IMG BX SPEC US  GUIDE 12/06/2022 [facility]   BREAST BIOPSY Left 12/06/2022   US  LT BREAST BX W LOC DEV 1ST LESION IMG BX SPEC US  GUIDE 12/06/2022 [facility]   BREAST RECONSTRUCTION WITH PLACEMENT OF TISSUE EXPANDER AND FLEX HD (ACELLULAR HYDRATED DERMIS) Bilateral 01/18/2023   Procedure: IMMEDIATE LEFT BREAST RECONSTRUCTION WITH PLACEMENT OF TISSUE EXPANDER AND FLEX HD (ACELLULAR HYDRATED DERMIS);  Surgeon: [name], DO;  Location: [facility];  Service: Plastics;  Laterality: Bilateral;   BREAST RECONSTRUCTION WITH PLACEMENT OF TISSUE EXPANDER AND FLEX HD (ACELLULAR  HYDRATED DERMIS) Left 03/26/2023   Procedure: BREAST RECONSTRUCTION WITH PLACEMENT OF TISSUE EXPANDER;  Surgeon: [name], DO;  Location: [facility];  Service: Plastics;  Laterality: Left;   CHOLECYSTECTOMY     COLONOSCOPY N/A 06/27/2021   Procedure: COLONOSCOPY;  Surgeon: Onita Elspeth Sharper, DO;  Location: [facility];  Service: Gastroenterology;  Laterality: N/A;   COLONOSCOPY N/A 08/27/2023   Procedure: COLONOSCOPY;  Surgeon: Onita Elspeth Sharper, DO;  Location: [facility] ENDOSCOPY;  Service: Gastroenterology;  Laterality: N/A;   DIAGNOSTIC LAPAROSCOPY     LOA   ESOPHAGOGASTRODUODENOSCOPY N/A 06/27/2021   Procedure: ESOPHAGOGASTRODUODENOSCOPY (EGD);  Surgeon: Onita Elspeth Sharper, DO;  Location: [facility] ENDOSCOPY;  Service: Gastroenterology;  Laterality: N/A;   ESOPHAGOGASTRODUODENOSCOPY (EGD) WITH PROPOFOL  N/A 07/25/2017   Procedure: ESOPHAGOGASTRODUODENOSCOPY (EGD) WITH PROPOFOL ;  Surgeon: Viktoria Lamar DASEN, MD;  Location: [facility] ENDOSCOPY;  Service: Endoscopy;  Laterality: N/A;   HERNIA REPAIR     KNEE ARTHROSCOPY Right    LAPAROSCOPIC GASTRIC SLEEVE RESECTION WITH HIATAL HERNIA REPAIR     LUMBAR FUSION  08/29/2022   Revision Lumbar two through five Laminectomy & Fusion with Transforaminal Lumbar interbody fusion   MASTECTOMY W/ SENTINEL NODE BIOPSY Bilateral 01/18/2023   Procedure: MASTECTOMY WITH SENTINEL LYMPH NODE BIOPSY, RNFA to assist;  Surgeon: Jordis Laneta FALCON, MD;  Location: [facility];  Service: General;  Laterality: Bilateral;   PULSE GENERATOR IMPLANT N/A 01/24/2024   Procedure: UNILATERAL PULSE GENERATOR IMPLANT;  Surgeon: [name] ORN, MD;  Location: [facility];  Service: Neurosurgery;  Laterality: N/A;  INTERNAL PULSE GENERATOR PLACEMENT FOR SPINAL CORD STIMULATOR   REMOVAL OF TISSUE EXPANDER AND PLACEMENT OF IMPLANT Bilateral 06/06/2023   Procedure: REMOVAL OF TISSUE EXPANDER AND PLACEMENT OF IMPLANT;  Surgeon: [name], DO;  Location:  [facility];  Service: Plastics;  Laterality: Bilateral;  SPINAL CORD STIMULATOR IMPLANT     THORACIC LAMINECTOMY FOR SPINAL CORD STIMULATOR N/A 01/15/2024   Procedure: THORACIC LAMINECTOMY FOR SPINAL CORD STIMULATOR;  Surgeon: [name] ORN, MD;  Location: [facility];  Service: Neurosurgery;  Laterality: N/A;  THORACIC LAMINECTOMY FOR SPINAL CORD STIMULATOR PADDLE TRIAL   TONSILLECTOMY     TOTAL HIP ARTHROPLASTY Left 01/26/2016   Procedure: LEFT TOTAL HIP ARTHROPLASTY ANTERIOR APPROACH;  Surgeon: Kay CHRISTELLA Cummins, MD;  Location: [facility];  Service: Orthopedics;  Laterality: Left;   TRANSFORAMINAL LUMBAR INTERBODY FUSION (TLIF) WITH PEDICLE SCREW FIXATION 3 LEVEL  08/2019   Lynwood Better, MD L2-L5    Social History: Social History   Socioeconomic History   Marital status: Divorced    Spouse name: Not on file   Number of children: 1   Years of education: Not on file   Highest education level: Not on file  Occupational History   Not on file  Tobacco Use   Smoking status: Never    Passive exposure: Never   Smokeless tobacco: Never  Vaping Use   Vaping status: Never Used  Substance and Sexual Activity   Alcohol use: Not Currently    Comment: occasional wine   Drug use: No   Sexual activity: Not Currently    Birth control/protection: Surgical    Comment: Hyst  Other Topics Concern   Not on file  Social History Narrative   Right handed    Uses cane to walk    Son has Medical and legal POA.   Social Drivers of Corporate investment banker Strain: Low Risk  (10/31/2023)   [facility]   Overall Financial Resource Strain (CARDIA)    Difficulty of Paying Living Expenses: Not very hard  Food Insecurity: No Food Insecurity (10/31/2023)   Received from [facility]   Hunger Vital Sign    Within the past 12 months, you worried that your food would run out before you got the money to buy more.: Never true    Within the past 12  months, the food you bought just didn't last and you didn't have money to get more.: Never true  Transportation Needs: No Transportation Needs (10/31/2023)   Received from [facility] - Transportation    In the past 12 months, has lack of transportation kept you from medical appointments or from getting medications?: No    Lack of Transportation (Non-Medical): No  Physical Activity: Inactive (12/11/2022)   Exercise Vital Sign    Days of Exercise per Week: 0 days    Minutes of Exercise per Session: 0 min  Stress: Stress Concern Present (12/11/2022)   [employer] of Occupational Health - Occupational Stress Questionnaire    Feeling of Stress : To some extent  Social Connections: Moderately Isolated (12/11/2022)   Social Connection and Isolation Panel    Frequency of Communication with Friends and Family: Three times a week    Frequency of Social Gatherings with Friends and Family: Twice a week    Attends Religious Services: 1 to 4 times per year    Active Member of [employer] or Organizations: No    Attends Banker Meetings: Never    Marital Status: Divorced  Catering manager Violence: Not At Risk (01/18/2023)   Humiliation, Afraid, Rape, and Kick questionnaire    Fear of Current or Ex-Partner: No    Emotionally Abused: No    Physically Abused: No  Sexually Abused: No    Family History: Family History  Problem Relation Age of Onset   Renal Disease Mother    Hypertension Mother    Sudden Cardiac Death Mother    Heart failure Mother    Valvular heart disease Mother    Heart disease Mother    Heart disease Father        Just had stents placed   Stroke Brother    Heart attack Brother 38   Breast cancer Maternal Grandmother    Diabetes Other     Review of Systems: ROS Denies any recent chest pain, difficulty breathing, leg swelling, fevers.  Physical Exam: Vital Signs BP 123/68 (BP Location: Right Arm, Patient Position: Sitting, Cuff  Size: Large)   Pulse 65   Resp (!) 99   Ht 5' 4 (1.626 m)   Wt 260 lb (117.9 kg)   BMI 44.63 kg/m   Physical Exam Constitutional:      General: Not in acute distress.    Appearance: Normal appearance. Not ill-appearing.  HENT:     Head: Normocephalic and atraumatic.  Eyes:     Pupils: Pupils are equal, round. Cardiovascular:     Rate and Rhythm: Normal rate.    Pulses: Normal pulses.  Pulmonary:     Effort: No respiratory distress or increased work of breathing.  Speaks in full sentences. Abdominal:     General: Abdomen is flat. No distension.   Musculoskeletal: Normal range of motion. No lower extremity swelling or edema. No varicosities. Skin:    General: Skin is warm and dry.     Findings: No erythema or rash.  Neurological:     Mental Status: Alert and oriented to person, place, and time.  Psychiatric:        Mood and Affect: Mood normal.        Behavior: Behavior normal.    Assessment/Plan: The patient is scheduled for fat grafting to bilateral reconstructed breasts with Dr. Lowery.  Risks, benefits, and alternatives of procedure discussed, questions answered and consent obtained.    Smoking Status: Non-smoker.  Last Mammogram: S/p bilateral mastectomy   Caprini Score: 10; Risk Factors include: Age, BMI greater than 40 kg/m, breast cancer, family history of DVT (brother), and length of planned surgery. Recommendation for mechanical and possibly pharmacological prophylaxis.  Will discuss with Dr. Lowery and prescribe Lovenox  if indicated.  Encourage early ambulation.   Pictures obtained: 03/25/2024  Post-op Rx sent to pharmacy: Vicodin, Zofran , Keflex .  She states that she can do hydrocodone , simply not oxycodone .  Patient was provided with the General Surgical Risk consent document and Pain Medication Agreement prior to their appointment.  They had adequate time to read through the risk consent documents and Pain Medication Agreement. We also discussed  them in person together during this preop appointment. All of their questions were answered to their satisfaction.  Recommended calling if they have any further questions.  Risk consent form and Pain Medication Agreement to be scanned into patient's chart.  The risks that can be encountered with and after liposuction were discussed and include the following but no limited to these:  Asymmetry, fluid accumulation, firmness of the area, fat necrosis with death of fat tissue, bleeding, infection, delayed healing, anesthesia risks, skin sensation changes, injury to structures including nerves, blood vessels, and muscles which may be temporary or permanent, allergies to tape, suture materials and glues, blood products, topical preparations or injected agents, skin and contour irregularities, skin discoloration and swelling, deep vein  thrombosis, cardiac and pulmonary complications, pain, which may persist, persistent pain, recurrence of the lesion, poor healing of the incision, possible need for revisional surgery or staged procedures. Thiere can also be persistent swelling, poor wound healing, rippling or loose skin, worsening of cellulite, swelling, and thermal burn or heat injury from ultrasound with the ultrasound-assisted lipoplasty technique. Any change in weight fluctuations can alter the outcome.    Electronically signed by: Honora Seip, PA-C 05/01/2024 10:40 AM

## 2024-05-01 NOTE — Progress Notes (Signed)
 Patient ID: Vanessa Medina, female    DOB: 1971/07/21, 53 y.o.   MRN: [id number]  Chief Complaint  Patient presents with   Pre-op Exam      ICD-10-CM   1. Malignant neoplasm of overlapping sites of left breast in female, estrogen receptor positive (HCC)  C50.812    Z17.0     2. Breast asymmetry following reconstructive surgery  N65.1        History of Present Illness: Vanessa Medina is a 53 y.o.  female  with a history of bilateral breast reconstruction.  She presents for preoperative evaluation for upcoming procedure, abdominal liposuction and fat grafting to bilateral reconstructed breasts, scheduled for 05/21/2024 with Dr. Lowery.  The patient has not had problems with anesthesia.  She does not smoke or use nicotine-containing products.  Endorses family history of DVT (brother), but no personal history of blood clots or clotting disorder.  Endorses spider veins, no varicosities.  History of breast cancer, but no longer on chemotherapy.  She has a severe skin sensitivity and has had profound tape dermatitis in the past.  No Steri-Strips or Dermabond to be used.  She can only use the silicone bordered Mepilex dressings without issue.  Latex allergy, as well.  She arrived in a cam walker today and reports that she is having right ankle surgery in November.  She is still able to ambulate.  Summary of Previous Visit: She was seen recently here in clinic on 03/26/2024.  At that time, she was following up after bilateral 650 cc implant placement 06/2023.  Patient expressed concern about a bit of rippling and asymmetry and was interested in fat grafting for improved symmetry and contour.  Plan for bilateral fat grafting.  Job: Retired.  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  She will hold her NSAIDs 5 days prior to surgery as well as her multivitamins.   Past Medical  History: Allergies: Allergies  Allergen Reactions   Bee Venom Swelling   Iodinated Contrast Media Anaphylaxis    Swelling  Other reaction(s): Other (See Comments)  Swelling   Swelling   Latex Hives and Rash   Morphine  Other (See Comments)    BRADYCARDIA   Shellfish Allergy Anaphylaxis   Codeine  Nausea And Vomiting   Meperidine Hcl Other (See Comments)    BRADYCARDIA   Bee Pollen Other (See Comments)    Unknown   Oxycodone  Hives and Itching    Blisters on back   Tape Hives    Adhesive   Pentazocine Lactate Nausea And Vomiting    REACTION: vomiting with Talwin NX   Propoxyphene Nausea Only and Nausea And Vomiting    Current Medications:  Current Outpatient Medications:    ALPRAZolam  (XANAX ) 0.25 MG tablet, Take 0.25 mg by mouth at bedtime as needed for sleep., Disp: , Rfl:    azelastine (ASTELIN) 0.1 % nasal spray, Place 1 spray into both nostrils 2 (two) times daily as needed for allergies. Use in each nostril as directed, Disp: , Rfl:    baclofen  (LIORESAL ) 10 MG tablet, Take 1 tablet (10 mg total) by mouth 3 (three) times daily. - for spasms, Disp: 270 tablet, Rfl: 1   Calcium  Carb-Cholecalciferol (CALCIUM +D3 PO), Take 1 tablet by mouth in the morning and at bedtime., Disp: , Rfl:    colestipol (COLESTID) 1 g tablet, Take 1 g by mouth at bedtime., Disp: , Rfl:  diclofenac  Sodium (VOLTAREN ) 1 % GEL, Apply 2 g topically 4 (four) times daily as needed (pain)., Disp: 300 g, Rfl: 1   DULoxetine  (CYMBALTA ) 60 MG capsule, Take 2 capsules (120 mg total) by mouth at bedtime., Disp: 180 capsule, Rfl: 3   EPINEPHrine  0.3 mg/0.3 mL IJ SOAJ injection, Inject 0.3 mg into the muscle as needed for anaphylaxis., Disp: , Rfl:    fluticasone  (FLONASE ) 50 MCG/ACT nasal spray, Place 2 sprays into both nostrils daily as needed for allergies., Disp: , Rfl:    furosemide (LASIX) 20 MG tablet, Take 20 mg by mouth at bedtime as needed for fluid or edema., Disp: , Rfl:    gabapentin  (NEURONTIN ) 300  MG capsule, Take 1 capsule (300 mg total) by mouth at bedtime. For nerve pain, Disp: 90 capsule, Rfl: 1   letrozole  (FEMARA ) 2.5 MG tablet, Take 2.5 mg by mouth daily., Disp: , Rfl:    levothyroxine (SYNTHROID) 25 MCG tablet, Take 25 mcg by mouth daily before breakfast., Disp: , Rfl:    meloxicam  (MOBIC ) 7.5 MG tablet, TAKE 1-2 BY MOUTH EVERY DAY WITH FOOD., Disp: 60 tablet, Rfl: 0   methocarbamol  (ROBAXIN ) 500 MG tablet, Take 1 tablet (500 mg total) by mouth every 8 (eight) hours as needed for muscle spasms., Disp: 90 tablet, Rfl: 1   montelukast  (SINGULAIR ) 10 MG tablet, TAKE 1 TABLET BY MOUTH AT  BEDTIME FOR ALLERGIES, Disp: 30 tablet, Rfl: 1   Multiple Vitamin (MULTIVITAMIN WITH MINERALS) TABS tablet, Take 1 tablet by mouth 2 (two) times daily., Disp: , Rfl:    pramipexole  (MIRAPEX ) 0.125 MG tablet, Take 0.5 mg by mouth at bedtime., Disp: , Rfl:    pyridOXINE (VITAMIN B-6) 100 MG tablet, Take 1 tablet (100 mg total) by mouth daily., Disp: 90 tablet, Rfl: 1   thiamine 250 MG tablet, Take 250 mg by mouth at bedtime., Disp: , Rfl:    tolterodine  (DETROL  LA) 4 MG 24 hr capsule, Take 1 capsule (4 mg total) by mouth every evening., Disp: 90 capsule, Rfl: 3   topiramate  (TOPAMAX ) 100 MG tablet, TAKE 1 TABLET BY MOUTH AT  BEDTIME, Disp: 90 tablet, Rfl: 1   vitamin B-12 (CYANOCOBALAMIN ) 1000 MCG tablet, Take 1 tablet (1,000 mcg total) by mouth daily., Disp: 90 tablet, Rfl: 1   docusate sodium  (COLACE) 100 MG capsule, Take 1 capsule (100 mg total) by mouth 2 (two) times daily., Disp: 10 capsule, Rfl: 0   hydrOXYzine  (ATARAX ) 10 MG tablet, Take 1 tablet (10 mg total) by mouth 3 (three) times daily as needed., Disp: 30 tablet, Rfl: 0  Past Medical Problems: Past Medical History:  Diagnosis Date   Acute nontraumatic kidney injury (HCC)    Anemia    Anxiety    Benign cyst of kidney    Breast cancer (HCC) 01/18/2023   left breast   Cervical cancer (HCC)    Chronic back pain    Chronic hypotension     Chronic kidney disease    Previous now clear   Claustrophobia    Decompression injury of spinal cord    Depression    Dizziness    Failed spinal cord stimulator (HCC)    Family history of adverse reaction to anesthesia      my mother takes a long time time wake up   History of dysplastic nevus 01/18/2018   right shoulder, recurrent dysplastic nevus   History of kidney stones    History of shingles    Hypercholesteremia  Inappropriate sinus node tachycardia (HCC)    Migraine    Morbid obesity (HCC)    Multiple allergies    Neurogenic orthostatic hypotension (HCC)    Obesity    Osteoarthritis    left hip   Pneumonia    PONV (postoperative nausea and vomiting)    delayed emergence   Seizure (HCC) 2021   x1-after a back surgery with spinal cord stimulator   Slow to wake up after anesthesia    Stroke (HCC) 08/11/2020   spinal cord stroke, spinal cord puncture during surgery   Wears glasses     Past Surgical History: Past Surgical History:  Procedure Laterality Date   ABDOMINAL HYSTERECTOMY     APPENDECTOMY     BACK SURGERY     BILIOPANCREATIC DIVISION W DUODENAL SWITCH N/A    BREAST BIOPSY Left 12/06/2022   u/s bx,1:00 heart clip, path pending   BREAST BIOPSY Left 12/06/2022   us  bx 2:00 ribbon clip path pending   BREAST BIOPSY Left 12/06/2022   US  LT BREAST BX W LOC DEV EA ADD LESION IMG BX SPEC US  GUIDE 12/06/2022 [facility]   BREAST BIOPSY Left 12/06/2022   US  LT BREAST BX W LOC DEV 1ST LESION IMG BX SPEC US  GUIDE 12/06/2022 [facility]   BREAST RECONSTRUCTION WITH PLACEMENT OF TISSUE EXPANDER AND FLEX HD (ACELLULAR HYDRATED DERMIS) Bilateral 01/18/2023   Procedure: IMMEDIATE LEFT BREAST RECONSTRUCTION WITH PLACEMENT OF TISSUE EXPANDER AND FLEX HD (ACELLULAR HYDRATED DERMIS);  Surgeon: [name], DO;  Location: [facility];  Service: Plastics;  Laterality: Bilateral;   BREAST RECONSTRUCTION WITH PLACEMENT OF TISSUE EXPANDER AND FLEX HD (ACELLULAR  HYDRATED DERMIS) Left 03/26/2023   Procedure: BREAST RECONSTRUCTION WITH PLACEMENT OF TISSUE EXPANDER;  Surgeon: [name], DO;  Location: [facility];  Service: Plastics;  Laterality: Left;   CHOLECYSTECTOMY     COLONOSCOPY N/A 06/27/2021   Procedure: COLONOSCOPY;  Surgeon: Onita Elspeth Sharper, DO;  Location: [facility];  Service: Gastroenterology;  Laterality: N/A;   COLONOSCOPY N/A 08/27/2023   Procedure: COLONOSCOPY;  Surgeon: Onita Elspeth Sharper, DO;  Location: [facility] ENDOSCOPY;  Service: Gastroenterology;  Laterality: N/A;   DIAGNOSTIC LAPAROSCOPY     LOA   ESOPHAGOGASTRODUODENOSCOPY N/A 06/27/2021   Procedure: ESOPHAGOGASTRODUODENOSCOPY (EGD);  Surgeon: Onita Elspeth Sharper, DO;  Location: [facility] ENDOSCOPY;  Service: Gastroenterology;  Laterality: N/A;   ESOPHAGOGASTRODUODENOSCOPY (EGD) WITH PROPOFOL  N/A 07/25/2017   Procedure: ESOPHAGOGASTRODUODENOSCOPY (EGD) WITH PROPOFOL ;  Surgeon: Viktoria Lamar DASEN, MD;  Location: [facility] ENDOSCOPY;  Service: Endoscopy;  Laterality: N/A;   HERNIA REPAIR     KNEE ARTHROSCOPY Right    LAPAROSCOPIC GASTRIC SLEEVE RESECTION WITH HIATAL HERNIA REPAIR     LUMBAR FUSION  08/29/2022   Revision Lumbar two through five Laminectomy & Fusion with Transforaminal Lumbar interbody fusion   MASTECTOMY W/ SENTINEL NODE BIOPSY Bilateral 01/18/2023   Procedure: MASTECTOMY WITH SENTINEL LYMPH NODE BIOPSY, RNFA to assist;  Surgeon: Jordis Laneta FALCON, MD;  Location: [facility];  Service: General;  Laterality: Bilateral;   PULSE GENERATOR IMPLANT N/A 01/24/2024   Procedure: UNILATERAL PULSE GENERATOR IMPLANT;  Surgeon: [name] ORN, MD;  Location: [facility];  Service: Neurosurgery;  Laterality: N/A;  INTERNAL PULSE GENERATOR PLACEMENT FOR SPINAL CORD STIMULATOR   REMOVAL OF TISSUE EXPANDER AND PLACEMENT OF IMPLANT Bilateral 06/06/2023   Procedure: REMOVAL OF TISSUE EXPANDER AND PLACEMENT OF IMPLANT;  Surgeon: [name], DO;  Location:  [facility];  Service: Plastics;  Laterality: Bilateral;  SPINAL CORD STIMULATOR IMPLANT     THORACIC LAMINECTOMY FOR SPINAL CORD STIMULATOR N/A 01/15/2024   Procedure: THORACIC LAMINECTOMY FOR SPINAL CORD STIMULATOR;  Surgeon: [name] ORN, MD;  Location: [facility];  Service: Neurosurgery;  Laterality: N/A;  THORACIC LAMINECTOMY FOR SPINAL CORD STIMULATOR PADDLE TRIAL   TONSILLECTOMY     TOTAL HIP ARTHROPLASTY Left 01/26/2016   Procedure: LEFT TOTAL HIP ARTHROPLASTY ANTERIOR APPROACH;  Surgeon: Kay CHRISTELLA Cummins, MD;  Location: [facility];  Service: Orthopedics;  Laterality: Left;   TRANSFORAMINAL LUMBAR INTERBODY FUSION (TLIF) WITH PEDICLE SCREW FIXATION 3 LEVEL  08/2019   Lynwood Better, MD L2-L5    Social History: Social History   Socioeconomic History   Marital status: Divorced    Spouse name: Not on file   Number of children: 1   Years of education: Not on file   Highest education level: Not on file  Occupational History   Not on file  Tobacco Use   Smoking status: Never    Passive exposure: Never   Smokeless tobacco: Never  Vaping Use   Vaping status: Never Used  Substance and Sexual Activity   Alcohol use: Not Currently    Comment: occasional wine   Drug use: No   Sexual activity: Not Currently    Birth control/protection: Surgical    Comment: Hyst  Other Topics Concern   Not on file  Social History Narrative   Right handed    Uses cane to walk    Son has Medical and legal POA.   Social Drivers of Corporate investment banker Strain: Low Risk  (10/31/2023)   [facility]   Overall Financial Resource Strain (CARDIA)    Difficulty of Paying Living Expenses: Not very hard  Food Insecurity: No Food Insecurity (10/31/2023)   Received from [facility]   Hunger Vital Sign    Within the past 12 months, you worried that your food would run out before you got the money to buy more.: Never true    Within the past 12  months, the food you bought just didn't last and you didn't have money to get more.: Never true  Transportation Needs: No Transportation Needs (10/31/2023)   Received from [facility] - Transportation    In the past 12 months, has lack of transportation kept you from medical appointments or from getting medications?: No    Lack of Transportation (Non-Medical): No  Physical Activity: Inactive (12/11/2022)   Exercise Vital Sign    Days of Exercise per Week: 0 days    Minutes of Exercise per Session: 0 min  Stress: Stress Concern Present (12/11/2022)   [employer] of Occupational Health - Occupational Stress Questionnaire    Feeling of Stress : To some extent  Social Connections: Moderately Isolated (12/11/2022)   Social Connection and Isolation Panel    Frequency of Communication with Friends and Family: Three times a week    Frequency of Social Gatherings with Friends and Family: Twice a week    Attends Religious Services: 1 to 4 times per year    Active Member of [employer] or Organizations: No    Attends Banker Meetings: Never    Marital Status: Divorced  Catering manager Violence: Not At Risk (01/18/2023)   Humiliation, Afraid, Rape, and Kick questionnaire    Fear of Current or Ex-Partner: No    Emotionally Abused: No    Physically Abused: No  Sexually Abused: No    Family History: Family History  Problem Relation Age of Onset   Renal Disease Mother    Hypertension Mother    Sudden Cardiac Death Mother    Heart failure Mother    Valvular heart disease Mother    Heart disease Mother    Heart disease Father        Just had stents placed   Stroke Brother    Heart attack Brother 38   Breast cancer Maternal Grandmother    Diabetes Other     Review of Systems: ROS Denies any recent chest pain, difficulty breathing, leg swelling, fevers.  Physical Exam: Vital Signs BP 123/68 (BP Location: Right Arm, Patient Position: Sitting, Cuff  Size: Large)   Pulse 65   Resp (!) 99   Ht 5' 4 (1.626 m)   Wt 260 lb (117.9 kg)   BMI 44.63 kg/m   Physical Exam Constitutional:      General: Not in acute distress.    Appearance: Normal appearance. Not ill-appearing.  HENT:     Head: Normocephalic and atraumatic.  Eyes:     Pupils: Pupils are equal, round. Cardiovascular:     Rate and Rhythm: Normal rate.    Pulses: Normal pulses.  Pulmonary:     Effort: No respiratory distress or increased work of breathing.  Speaks in full sentences. Abdominal:     General: Abdomen is flat. No distension.   Musculoskeletal: Normal range of motion. No lower extremity swelling or edema. No varicosities. Skin:    General: Skin is warm and dry.     Findings: No erythema or rash.  Neurological:     Mental Status: Alert and oriented to person, place, and time.  Psychiatric:        Mood and Affect: Mood normal.        Behavior: Behavior normal.    Assessment/Plan: The patient is scheduled for fat grafting to bilateral reconstructed breasts with Dr. Lowery.  Risks, benefits, and alternatives of procedure discussed, questions answered and consent obtained.    Smoking Status: Non-smoker.  Last Mammogram: S/p bilateral mastectomy   Caprini Score: 10; Risk Factors include: Age, BMI greater than 40 kg/m, breast cancer, family history of DVT (brother), and length of planned surgery. Recommendation for mechanical and possibly pharmacological prophylaxis.  Will discuss with Dr. Lowery and prescribe Lovenox  if indicated.  Encourage early ambulation.   Pictures obtained: 03/25/2024  Post-op Rx sent to pharmacy: Vicodin, Zofran , Keflex .  She states that she can do hydrocodone , simply not oxycodone .  Patient was provided with the General Surgical Risk consent document and Pain Medication Agreement prior to their appointment.  They had adequate time to read through the risk consent documents and Pain Medication Agreement. We also discussed  them in person together during this preop appointment. All of their questions were answered to their satisfaction.  Recommended calling if they have any further questions.  Risk consent form and Pain Medication Agreement to be scanned into patient's chart.  The risks that can be encountered with and after liposuction were discussed and include the following but no limited to these:  Asymmetry, fluid accumulation, firmness of the area, fat necrosis with death of fat tissue, bleeding, infection, delayed healing, anesthesia risks, skin sensation changes, injury to structures including nerves, blood vessels, and muscles which may be temporary or permanent, allergies to tape, suture materials and glues, blood products, topical preparations or injected agents, skin and contour irregularities, skin discoloration and swelling, deep vein  thrombosis, cardiac and pulmonary complications, pain, which may persist, persistent pain, recurrence of the lesion, poor healing of the incision, possible need for revisional surgery or staged procedures. Thiere can also be persistent swelling, poor wound healing, rippling or loose skin, worsening of cellulite, swelling, and thermal burn or heat injury from ultrasound with the ultrasound-assisted lipoplasty technique. Any change in weight fluctuations can alter the outcome.    Electronically signed by: Honora Seip, PA-C 05/01/2024 10:40 AM

## 2024-05-01 NOTE — Progress Notes (Signed)
 Office Visit Note   Patient: Vanessa Medina           Date of Birth: 1970/09/28           MRN: [id number] Visit Date: 05/01/2024              Requested by: Valora Agent, MD [address] [place],  [place] [address] PCP: Valora Agent, MD   Assessment & Plan: Visit Diagnoses:  1. Pain in left hip     Plan: History of Present Illness Torra Pala is a 53 year old female who presents with severe left hip and leg pain.  She experiences severe pain in her left hip, radiating down into her leg, described as bone pain and rated as ten out of ten. The pain disrupts her sleep and persists despite ongoing physical therapy.  She is wearing a boot on her right foot due to torn tendons, diagnosed after an MRI.  Physical Exam MUSCULOSKELETAL: Left hip and thigh tender on palpation. Tenderness over left troch bursa.  Assessment and Plan Left hip and thigh bursitis/tendinitis Severe left hip and thigh pain. Likely bursitis or tendinitis, possibly worsened by back issues and boot use. Weight may contribute to strain on abductors.. - Administer trochanteric cortisone injection for relief. - Continue physical therapy. - Advise weight reduction.  Follow-Up Instructions: No follow-ups on file.   Orders:  Orders Placed This Encounter  Procedures   Large Joint Inj   XR HIP UNILAT W OR W/O PELVIS 1V LEFT   No orders of the defined types were placed in this encounter.     Procedures: Large Joint Inj: L greater trochanter on 05/01/2024 2:14 PM Indications: pain Details: 22 G needle Medications: 3 mL lidocaine  1 %; 3 mL bupivacaine  0.5 %; 40 mg methylPREDNISolone  acetate 40 MG/ML Outcome: tolerated well, no immediate complications Patient was prepped and draped in the usual sterile fashion.       Clinical Data: No additional findings.   Subjective: Chief Complaint  Patient presents with   Left Hip - Pain    HPI  Review of Systems   Constitutional: Negative.   HENT: Negative.    Eyes: Negative.   Respiratory: Negative.    Cardiovascular: Negative.   Endocrine: Negative.   Musculoskeletal: Negative.   Neurological: Negative.   Hematological: Negative.   Psychiatric/Behavioral: Negative.    All other systems reviewed and are negative.    Objective: Vital Signs: There were no vitals taken for this visit.  Physical Exam Vitals and nursing note reviewed.  Constitutional:      Appearance: She is well-developed.  HENT:     Head: Normocephalic and atraumatic.  Pulmonary:     Effort: Pulmonary effort is normal.  Abdominal:     Palpations: Abdomen is soft.  Musculoskeletal:     Cervical back: Neck supple.  Skin:    General: Skin is warm.     Capillary Refill: Capillary refill takes less than 2 seconds.  Neurological:     Mental Status: She is alert and oriented to person, place, and time.  Psychiatric:        Behavior: Behavior normal.        Thought Content: Thought content normal.        Judgment: Judgment normal.     Ortho Exam  Specialty Comments:  No specialty comments available.  Imaging: XR HIP UNILAT W OR W/O PELVIS 1V LEFT Result Date: 05/01/2024 X-rays of the pelvis show a  left total hip arthroplasty without any complications.    PMFS History: Patient Active Problem List   Diagnosis Date Noted   Breast asymmetry following reconstructive surgery 03/25/2024   S/P insertion of spinal cord stimulator 03/21/2024   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   Pain and swelling of lower leg 09/25/2023   Lymphedema 09/25/2023   Long term (current) use of aromatase inhibitors 09/07/2023   At risk for loss of bone density 09/07/2023   Changing skin lesion 09/04/2023   Frequent falls 08/22/2023   Chronic pain syndrome 08/22/2023   Bilateral lower extremity edema 08/06/2023   Back pain 08/06/2023   Claustrophobia    Pernicious anemia 03/14/2023   Ruptured left breast implant 02/13/2023    S/P mastectomy, bilateral 01/18/2023   Genetic testing 12/18/2022   Breast cancer (HCC) 12/08/2022   Family history of breast cancer 12/08/2022   Profound fatigue 12/08/2022   Lumbar post-laminectomy syndrome 06/02/2022   Pseudarthrosis after fusion or arthrodesis 05/19/2022   Primary osteoarthritis of right knee 03/28/2022   Subjective tinnitus of both ears 12/21/2021   Abnormality of gait 04/29/2021   Abnormal sensation in both ears 04/06/2021   Other adverse food reactions, not elsewhere classified, subsequent encounter 04/06/2021   Multiple drug allergies 04/06/2021   Insomnia due to medical condition 02/25/2021   Myofascial muscle pain 02/25/2021   Nerve pain 10/18/2020   Incomplete paraplegia (HCC) 10/18/2020   Orthostatic hypotension 10/18/2020   Renal cyst 09/23/2020   Chronic migraine without aura without status migrainosus, not intractable    Hypokalemia    Adjustment reaction with anxiety and depression    Spinal cord injury, lumbar, without spinal bone injury, sequela (HCC) 08/18/2020   Paraparesis (HCC)    Post-operative pain    Hypotension    Slow transit constipation    AKI (acute kidney injury) (HCC)    Right leg weakness 08/11/2020   DDD (degenerative disc disease), lumbar 09/02/2019    Class: Chronic   Degenerative disc disease, lumbar 09/02/2019   Chest tightness 09/23/2018   Bradycardia 09/23/2018   BMI 40.0-44.9, adult (HCC) 04/17/2018   Status post bariatric surgery 04/17/2018   Labile blood pressure 03/08/2018   Chronic low back pain 09/03/2017   History of total hip replacement, left 01/26/2016   Osteoarthritis of spine with radiculopathy, lumbar region 10/16/2014   LEG PAIN, BILATERAL 12/16/2007   Malignant neoplasm of cervix uteri (HCC) 07/02/2007   MORBID OBESITY 07/02/2007   DEPRESSION 07/02/2007   Migraine without aura 07/02/2007   Other allergic rhinitis 07/02/2007   Asthma 07/02/2007   GERD 07/02/2007   ELEVATED BLOOD PRESSURE WITHOUT  DIAGNOSIS OF HYPERTENSION 07/02/2007   Asthma 07/02/2007   Past Medical History:  Diagnosis Date   Acute nontraumatic kidney injury (HCC)    Anemia    Anxiety    Benign cyst of kidney    Breast cancer (HCC) 01/18/2023   left breast   Cervical cancer (HCC)    Chronic back pain    Chronic hypotension    Chronic kidney disease    Previous now clear   Claustrophobia    Decompression injury of spinal cord    Depression    Dizziness    Failed spinal cord stimulator (HCC)    Family history of adverse reaction to anesthesia      my mother takes a long time time wake up   History of dysplastic nevus 01/18/2018   right shoulder, recurrent dysplastic nevus   History of kidney stones  History of shingles    Hypercholesteremia    Inappropriate sinus node tachycardia (HCC)    Migraine    Morbid obesity (HCC)    Multiple allergies    Neurogenic orthostatic hypotension (HCC)    Obesity    Osteoarthritis    left hip   Pneumonia    PONV (postoperative nausea and vomiting)    delayed emergence   Seizure (HCC) 2021   x1-after a back surgery with spinal cord stimulator   Slow to wake up after anesthesia    Stroke (HCC) 08/11/2020   spinal cord stroke, spinal cord puncture during surgery   Wears glasses     Family History  Problem Relation Age of Onset   Renal Disease Mother    Hypertension Mother    Sudden Cardiac Death Mother    Heart failure Mother    Valvular heart disease Mother    Heart disease Mother    Heart disease Father        Just had stents placed   Stroke Brother    Heart attack Brother 36   Breast cancer Maternal Grandmother    Diabetes Other     Past Surgical History:  Procedure Laterality Date   ABDOMINAL HYSTERECTOMY     APPENDECTOMY     BACK SURGERY     BILIOPANCREATIC DIVISION W DUODENAL SWITCH N/A    BREAST BIOPSY Left 12/06/2022   u/s bx,1:00 heart clip, path pending   BREAST BIOPSY Left 12/06/2022   us  bx 2:00 ribbon clip path pending    BREAST BIOPSY Left 12/06/2022   US  LT BREAST BX W LOC DEV EA ADD LESION IMG BX SPEC US  GUIDE 12/06/2022 [facility]   BREAST BIOPSY Left 12/06/2022   US  LT BREAST BX W LOC DEV 1ST LESION IMG BX SPEC US  GUIDE 12/06/2022 [facility]   BREAST RECONSTRUCTION WITH PLACEMENT OF TISSUE EXPANDER AND FLEX HD (ACELLULAR HYDRATED DERMIS) Bilateral 01/18/2023   Procedure: IMMEDIATE LEFT BREAST RECONSTRUCTION WITH PLACEMENT OF TISSUE EXPANDER AND FLEX HD (ACELLULAR HYDRATED DERMIS);  Surgeon: [name], DO;  Location: [facility];  Service: Plastics;  Laterality: Bilateral;   BREAST RECONSTRUCTION WITH PLACEMENT OF TISSUE EXPANDER AND FLEX HD (ACELLULAR HYDRATED DERMIS) Left 03/26/2023   Procedure: BREAST RECONSTRUCTION WITH PLACEMENT OF TISSUE EXPANDER;  Surgeon: [name], DO;  Location:  SUR[facility]ERY CENTER;  Service: Plastics;  Laterality: Left;   CHOLECYSTECTOMY     COLONOSCOPY N/A 06/27/2021   Procedure: COLONOSCOPY;  Surgeon: Onita Elspeth Sharper, DO;  Location: [facility]COPY;  Service: Gastroenterology;  Laterality: N/A;   COLONOSCOPY N/A 08/27/2023   Procedure: COLONOSCOPY;  Surgeon: Onita Elspeth Sharper, DO;  Location: [facility] ENDOSCOPY;  Service: Gastroenterology;  Laterality: N/A;   DIAGNOSTIC LAPAROSCOPY     LOA   ESOPHAGOGASTRODUODENOSCOPY N/A 06/27/2021   Procedure: ESOPHAGOGASTRODUODENOSCOPY (EGD);  Surgeon: Onita Elspeth Sharper, DO;  Location: [facility] ENDOSCOPY;  Service: Gastroenterology;  Laterality: N/A;   ESOPHAGOGASTRODUODENOSCOPY (EGD) WITH PROPOFOL  N/A 07/25/2017   Procedure: ESOPHAGOGASTRODUODENOSCOPY (EGD) WITH PROPOFOL ;  Surgeon: Viktoria Lamar DASEN, MD;  Location: [facility]OSCOPY;  Service: Endoscopy;  Laterality: N/A;   HERNIA REPAIR     KNEE ARTHROSCOPY Right    LAPAROSCOPIC GASTRIC SLEEVE RESECTION WITH HIATAL HERNIA REPAIR     LUMBAR FUSION  08/29/2022   Revision Lumbar two through five Laminectomy & Fusion with Transforaminal Lumbar interbody fusion    MASTECTOMY W/ SENTINEL NODE BIOPSY Bilateral 01/18/2023   Procedure: MASTECTOMY WITH SENTINEL LYMPH NODE BIOPSY, RNFA to assist;  Surgeon: Jordis Cliche  F, MD;  Location: [facility];  Service: General;  Laterality: Bilateral;   PULSE GENERATOR IMPLANT N/A 01/24/2024   Procedure: UNILATERAL PULSE GENERATOR IMPLANT;  Surgeon: [name] ORN, MD;  Location: [facility];  Service: Neurosurgery;  Laterality: N/A;  INTERNAL PULSE GENERATOR PLACEMENT FOR SPINAL CORD STIMULATOR   REMOVAL OF TISSUE EXPANDER AND PLACEMENT OF IMPLANT Bilateral 06/06/2023   Procedure: REMOVAL OF TISSUE EXPANDER AND PLACEMENT OF IMPLANT;  Surgeon: [name], DO;  Location: [facility];  Service: Plastics;  Laterality: Bilateral;   SPINAL CORD STIMULATOR IMPLANT     THORACIC LAMINECTOMY FOR SPINAL CORD STIMULATOR N/A 01/15/2024   Procedure: THORACIC LAMINECTOMY FOR SPINAL CORD STIMULATOR;  Surgeon: [name] ORN, MD;  Location: [facility];  Service: Neurosurgery;  Laterality: N/A;  THORACIC LAMINECTOMY FOR SPINAL CORD STIMULATOR PADDLE TRIAL   TONSILLECTOMY     TOTAL HIP ARTHROPLASTY Left 01/26/2016   Procedure: LEFT TOTAL HIP ARTHROPLASTY ANTERIOR APPROACH;  Surgeon: Kay CHRISTELLA Cummins, MD;  Location: [facility];  Service: Orthopedics;  Laterality: Left;   TRANSFORAMINAL LUMBAR INTERBODY FUSION (TLIF) WITH PEDICLE SCREW FIXATION 3 LEVEL  08/2019   Lynwood Better, MD L2-L5   Social History   Occupational History   Not on file  Tobacco Use   Smoking status: Never    Passive exposure: Never   Smokeless tobacco: Never  Vaping Use   Vaping status: Never Used  Substance and Sexual Activity   Alcohol use: Not Currently    Comment: occasional wine   Drug use: No   Sexual activity: Not Currently    Birth control/protection: Surgical    Comment: Hyst

## 2024-05-07 ENCOUNTER — Ambulatory Visit: Admitting: Oncology

## 2024-05-07 ENCOUNTER — Encounter

## 2024-05-14 ENCOUNTER — Other Ambulatory Visit: Payer: Self-pay

## 2024-05-14 ENCOUNTER — Encounter

## 2024-05-14 ENCOUNTER — Encounter (HOSPITAL_BASED_OUTPATIENT_CLINIC_OR_DEPARTMENT_OTHER): Payer: Self-pay | Admitting: Plastic Surgery

## 2024-05-15 ENCOUNTER — Ambulatory Visit: Admitting: Neurosurgery

## 2024-05-19 ENCOUNTER — Other Ambulatory Visit: Payer: Self-pay | Admitting: Neurosurgery

## 2024-05-19 ENCOUNTER — Encounter: Payer: Self-pay | Admitting: Oncology

## 2024-05-19 ENCOUNTER — Inpatient Hospital Stay: Attending: Oncology | Admitting: Oncology

## 2024-05-19 VITALS — BP 139/87 | HR 76 | Temp 97.6°F | Resp 18 | Wt 271.5 lb

## 2024-05-19 DIAGNOSIS — Z803 Family history of malignant neoplasm of breast: Secondary | ICD-10-CM | POA: Insufficient documentation

## 2024-05-19 DIAGNOSIS — Z79811 Long term (current) use of aromatase inhibitors: Secondary | ICD-10-CM | POA: Insufficient documentation

## 2024-05-19 DIAGNOSIS — Z1732 Human epidermal growth factor receptor 2 negative status: Secondary | ICD-10-CM | POA: Diagnosis not present

## 2024-05-19 DIAGNOSIS — C50912 Malignant neoplasm of unspecified site of left female breast: Secondary | ICD-10-CM | POA: Insufficient documentation

## 2024-05-19 DIAGNOSIS — Z79899 Other long term (current) drug therapy: Secondary | ICD-10-CM | POA: Diagnosis not present

## 2024-05-19 DIAGNOSIS — Z9013 Acquired absence of bilateral breasts and nipples: Secondary | ICD-10-CM | POA: Insufficient documentation

## 2024-05-19 DIAGNOSIS — Z170 Estrogen receptor positive status [ER+]: Secondary | ICD-10-CM | POA: Insufficient documentation

## 2024-05-19 DIAGNOSIS — C50812 Malignant neoplasm of overlapping sites of left female breast: Secondary | ICD-10-CM | POA: Diagnosis not present

## 2024-05-19 DIAGNOSIS — Z1721 Progesterone receptor positive status: Secondary | ICD-10-CM | POA: Insufficient documentation

## 2024-05-19 DIAGNOSIS — E039 Hypothyroidism, unspecified: Secondary | ICD-10-CM | POA: Insufficient documentation

## 2024-05-19 NOTE — Progress Notes (Signed)
 [facility]  Telephone:([phone]) [phone] [phone]) [phone]  ID: Vanessa Medina OB: 12-05-1970  MR#: [id number]  RDW#:[id number]  Patient Care Team: Valora Agent, MD as PCP - General (Family Medicine) [name], MD as PCP - Cardiology (Cardiology) [name], MD as Consulting Physician (Cardiology) [name], DO as Consulting Physician (Neurology) [name], RN as Oncology Nurse Navigator [name] ORN, MD as Consulting Physician (Neurosurgery) [name], MD as Consulting Physician (Oncology)  CHIEF COMPLAINT: Stage Ia ER/PR positive, HER2 negative invasive carcinoma of the left breast.  INTERVAL HISTORY: Patient returns to clinic today for routine evaluation and discussion on whether or not to reinitiate letrozole .  She reports she was recently diagnosed with hypothyroidism which was also likely the etiology of her hair thinning and has subsequently been placed on Synthroid.  She otherwise feels well.  She has no neurologic complaints.  She denies any recent fevers or illnesses.  She has a good appetite and denies weight loss.  She has no chest pain, shortness of breath, cough, or hemoptysis.  She denies any nausea, vomiting, constipation, or diarrhea.  She has no urinary complaints.  Patient offers no further specific complaints today.  REVIEW OF SYSTEMS:   Review of Systems  Constitutional: Negative.  Negative for fever, malaise/fatigue and weight loss.  Respiratory: Negative.  Negative for cough, hemoptysis and shortness of breath.   Cardiovascular: Negative.  Negative for chest pain and leg swelling.  Gastrointestinal: Negative.  Negative for abdominal pain.  Genitourinary: Negative.  Negative for dysuria.  Musculoskeletal: Negative.  Negative for back pain.  Skin: Negative.  Negative for rash.  Neurological: Negative.  Negative for dizziness, focal weakness, weakness and headaches.  Psychiatric/Behavioral: Negative.  The  patient is not nervous/anxious.     As per HPI. Otherwise, a complete review of systems is negative.  PAST MEDICAL HISTORY: Past Medical History:  Diagnosis Date   Acute nontraumatic kidney injury (HCC)    Anemia    Anxiety    Asthma    Benign cyst of kidney    Breast cancer (HCC) 01/18/2023   left breast-bil mastectomies   Cervical cancer (HCC)    Chronic back pain    Chronic hypotension    Chronic kidney disease    Previous now clear   Claustrophobia    Decompression injury of spinal cord    Depression    Dizziness    Failed spinal cord stimulator (HCC)    Family history of adverse reaction to anesthesia      my mother takes a long time time wake up   History of dysplastic nevus 01/18/2018   right shoulder, recurrent dysplastic nevus   History of kidney stones    History of shingles    Hypercholesteremia    Hypothyroidism    Inappropriate sinus node tachycardia (HCC)    Migraine    Morbid obesity (HCC)    Multiple allergies    Neurogenic orthostatic hypotension (HCC)    Obesity    Osteoarthritis    left hip   Pneumonia    PONV (postoperative nausea and vomiting)    delayed emergence   S/P insertion of spinal cord stimulator 01/24/2024   Seizure (HCC) 2021   x1-after a back surgery with spinal cord stimulator   Slow to wake up after anesthesia    Stroke (HCC) 08/11/2020   spinal cord stroke, spinal cord puncture during surgery   Wears glasses     PAST SURGICAL HISTORY: Past Surgical  History:  Procedure Laterality Date   ABDOMINAL HYSTERECTOMY     APPENDECTOMY     BACK SURGERY     BILIOPANCREATIC DIVISION W DUODENAL SWITCH N/A    BREAST BIOPSY Left 12/06/2022   u/s bx,1:00 heart clip, path pending   BREAST BIOPSY Left 12/06/2022   us  bx 2:00 ribbon clip path pending   BREAST BIOPSY Left 12/06/2022   US  LT BREAST BX W LOC DEV EA ADD LESION IMG BX SPEC US  GUIDE 12/06/2022 [facility]   BREAST BIOPSY Left 12/06/2022   US  LT BREAST BX W LOC DEV 1ST  LESION IMG BX SPEC US  GUIDE 12/06/2022 [facility]   BREAST RECONSTRUCTION WITH PLACEMENT OF TISSUE EXPANDER AND FLEX HD (ACELLULAR HYDRATED DERMIS) Bilateral 01/18/2023   Procedure: IMMEDIATE LEFT BREAST RECONSTRUCTION WITH PLACEMENT OF TISSUE EXPANDER AND FLEX HD (ACELLULAR HYDRATED DERMIS);  Surgeon: [name], DO;  Location: [facility];  Service: Plastics;  Laterality: Bilateral;   BREAST RECONSTRUCTION WITH PLACEMENT OF TISSUE EXPANDER AND FLEX HD (ACELLULAR HYDRATED DERMIS) Left 03/26/2023   Procedure: BREAST RECONSTRUCTION WITH PLACEMENT OF TISSUE EXPANDER;  Surgeon: [name], DO;  Location: [facility];  Service: Plastics;  Laterality: Left;   CHOLECYSTECTOMY     COLONOSCOPY N/A 06/27/2021   Procedure: COLONOSCOPY;  Surgeon: Onita Elspeth Sharper, DO;  Location: [facility] ENDOSCOPY;  Service: Gastroenterology;  Laterality: N/A;   COLONOSCOPY N/A 08/27/2023   Procedure: COLONOSCOPY;  Surgeon: Onita Elspeth Sharper, DO;  Location: [facility] ENDOSCOPY;  Service: Gastroenterology;  Laterality: N/A;   DIAGNOSTIC LAPAROSCOPY     LOA   ESOPHAGOGASTRODUODENOSCOPY N/A 06/27/2021   Procedure: ESOPHAGOGASTRODUODENOSCOPY (EGD);  Surgeon: Onita Elspeth Sharper, DO;  Location: [facility] ENDOSCOPY;  Service: Gastroenterology;  Laterality: N/A;   ESOPHAGOGASTRODUODENOSCOPY (EGD) WITH PROPOFOL  N/A 07/25/2017   Procedure: ESOPHAGOGASTRODUODENOSCOPY (EGD) WITH PROPOFOL ;  Surgeon: Viktoria Lamar DASEN, MD;  Location: [facility] ENDOSCOPY;  Service: Endoscopy;  Laterality: N/A;   HERNIA REPAIR     KNEE ARTHROSCOPY Right    LAPAROSCOPIC GASTRIC SLEEVE RESECTION WITH HIATAL HERNIA REPAIR     LUMBAR FUSION  08/29/2022   Revision Lumbar two through five Laminectomy & Fusion with Transforaminal Lumbar interbody fusion   MASTECTOMY W/ SENTINEL NODE BIOPSY Bilateral 01/18/2023   Procedure: MASTECTOMY WITH SENTINEL LYMPH NODE BIOPSY, RNFA to assist;  Surgeon: Jordis Laneta FALCON, MD;  Location: [facility];   Service: General;  Laterality: Bilateral;   PULSE GENERATOR IMPLANT N/A 01/24/2024   Procedure: UNILATERAL PULSE GENERATOR IMPLANT;  Surgeon: [name] ORN, MD;  Location: [facility];  Service: Neurosurgery;  Laterality: N/A;  INTERNAL PULSE GENERATOR PLACEMENT FOR SPINAL CORD STIMULATOR   REMOVAL OF TISSUE EXPANDER AND PLACEMENT OF IMPLANT Bilateral 06/06/2023   Procedure: REMOVAL OF TISSUE EXPANDER AND PLACEMENT OF IMPLANT;  Surgeon: [name], DO;  Location: [facility];  Service: Plastics;  Laterality: Bilateral;   SPINAL CORD STIMULATOR IMPLANT     THORACIC LAMINECTOMY FOR SPINAL CORD STIMULATOR N/A 01/15/2024   Procedure: THORACIC LAMINECTOMY FOR SPINAL CORD STIMULATOR;  Surgeon: [name] ORN, MD;  Location: [facility];  Service: Neurosurgery;  Laterality: N/A;  THORACIC LAMINECTOMY FOR SPINAL CORD STIMULATOR PADDLE TRIAL   TONSILLECTOMY     TOTAL HIP ARTHROPLASTY Left 01/26/2016   Procedure: LEFT TOTAL HIP ARTHROPLASTY ANTERIOR APPROACH;  Surgeon: Kay CHRISTELLA Cummins, MD;  Location: [facility];  Service: Orthopedics;  Laterality: Left;   TRANSFORAMINAL LUMBAR INTERBODY FUSION (TLIF) WITH PEDICLE SCREW FIXATION 3 LEVEL  08/2019   Lynwood Better, MD L2-L5  FAMILY HISTORY: Family History  Problem Relation Age of Onset   Renal Disease Mother    Hypertension Mother    Sudden Cardiac Death Mother    Heart failure Mother    Valvular heart disease Mother    Heart disease Mother    Heart disease Father        Just had stents placed   Stroke Brother    Heart attack Brother 30   Breast cancer Maternal Grandmother    Diabetes Other     ADVANCED DIRECTIVES (Y/N):  N  HEALTH MAINTENANCE: Social History   Tobacco Use   Smoking status: Never    Passive exposure: Never   Smokeless tobacco: Never  Vaping Use   Vaping status: Never Used  Substance Use Topics   Alcohol use: Yes    Comment: occasional wine   Drug use: No     Colonoscopy:  PAP:  Bone density:  Lipid  panel:  Allergies  Allergen Reactions   Bee Venom Swelling   Iodinated Contrast Media Anaphylaxis    Swelling  Other reaction(s): Other (See Comments)  Swelling   Swelling   Latex Hives and Rash   Morphine  Other (See Comments)    BRADYCARDIA   Shellfish Allergy Anaphylaxis   Codeine  Nausea And Vomiting   Meperidine Hcl Other (See Comments)    BRADYCARDIA   Bee Pollen Other (See Comments)    Unknown   Oxycodone  Hives and Itching    Blisters on back   Tape Hives    Adhesive   Pentazocine Lactate Nausea And Vomiting    REACTION: vomiting with Talwin NX   Propoxyphene Nausea Only and Nausea And Vomiting    Current Outpatient Medications  Medication Sig Dispense Refill   ALPRAZolam  (XANAX ) 0.25 MG tablet Take 0.25 mg by mouth at bedtime as needed for sleep.     azelastine (ASTELIN) 0.1 % nasal spray Place 1 spray into both nostrils 2 (two) times daily as needed for allergies. Use in each nostril as directed     baclofen  (LIORESAL ) 10 MG tablet Take 1 tablet (10 mg total) by mouth 3 (three) times daily. - for spasms 270 tablet 1   Calcium  Carb-Cholecalciferol (CALCIUM +D3 PO) Take 1 tablet by mouth in the morning and at bedtime.     colestipol (COLESTID) 1 g tablet Take 1 g by mouth at bedtime.     diclofenac  Sodium (VOLTAREN ) 1 % GEL Apply 2 g topically 4 (four) times daily as needed (pain). 300 g 1   DULoxetine  (CYMBALTA ) 60 MG capsule Take 2 capsules (120 mg total) by mouth at bedtime. 180 capsule 3   EPINEPHrine  0.3 mg/0.3 mL IJ SOAJ injection Inject 0.3 mg into the muscle as needed for anaphylaxis.     fluticasone  (FLONASE ) 50 MCG/ACT nasal spray Place 2 sprays into both nostrils daily as needed for allergies.     furosemide (LASIX) 20 MG tablet Take 20 mg by mouth at bedtime as needed for fluid or edema.     gabapentin  (NEURONTIN ) 300 MG capsule Take 1 capsule (300 mg total) by mouth at bedtime. For nerve pain 90 capsule 1   levothyroxine (SYNTHROID) 25 MCG tablet Take 25 mcg  by mouth daily before breakfast.     meloxicam  (MOBIC ) 7.5 MG tablet TAKE 1-2 TABLETS EVERY DAY WITH FOOD. 60 tablet 0   methocarbamol  (ROBAXIN ) 500 MG tablet Take 1 tablet (500 mg total) by mouth every 8 (eight) hours as needed for muscle spasms. 90 tablet 1  montelukast  (SINGULAIR ) 10 MG tablet TAKE 1 TABLET BY MOUTH AT  BEDTIME FOR ALLERGIES 30 tablet 1   Multiple Vitamin (MULTIVITAMIN WITH MINERALS) TABS tablet Take 1 tablet by mouth 2 (two) times daily.     ondansetron  (ZOFRAN -ODT) 4 MG disintegrating tablet Take 1 tablet (4 mg total) by mouth every 8 (eight) hours as needed for nausea or vomiting. 20 tablet 0   pramipexole  (MIRAPEX ) 0.125 MG tablet Take 0.5 mg by mouth at bedtime.     pyridOXINE (VITAMIN B-6) 100 MG tablet Take 1 tablet (100 mg total) by mouth daily. 90 tablet 1   thiamine 250 MG tablet Take 250 mg by mouth at bedtime.     tolterodine  (DETROL  LA) 4 MG 24 hr capsule Take 1 capsule (4 mg total) by mouth every evening. 90 capsule 3   topiramate  (TOPAMAX ) 100 MG tablet TAKE 1 TABLET BY MOUTH AT  BEDTIME 90 tablet 1   vitamin B-12 (CYANOCOBALAMIN ) 1000 MCG tablet Take 1 tablet (1,000 mcg total) by mouth daily. 90 tablet 1   hydrOXYzine  (ATARAX ) 10 MG tablet Take 1 tablet (10 mg total) by mouth 3 (three) times daily as needed. 30 tablet 0   letrozole  (FEMARA ) 2.5 MG tablet Take 2.5 mg by mouth daily. (Patient not taking: Reported on 05/19/2024)     No current facility-administered medications for this visit.    OBJECTIVE: Vitals:   05/19/24 1005  BP: 139/87  Pulse: 76  Resp: 18  Temp: 97.6 F (36.4 C)  SpO2: 97%     Body mass index is 46.6 kg/m.    ECOG FS:0 - Asymptomatic  General: Well-developed, well-nourished, no acute distress. Eyes: Pink conjunctiva, anicteric sclera. HEENT: Normocephalic, moist mucous membranes. Lungs: No audible wheezing or coughing. Heart: Regular rate and rhythm. Abdomen: Soft, nontender, no obvious distention. Musculoskeletal: No edema,  cyanosis, or clubbing. Neuro: Alert, answering all questions appropriately. Cranial nerves grossly intact. Skin: No rashes or petechiae noted. Psych: Normal affect.  LAB RESULTS:  Lab Results  Component Value Date   NA 140 03/06/2024   K 3.3 (L) 03/06/2024   CL 114 (H) 03/06/2024   CO2 19 (L) 03/06/2024   GLUCOSE 112 (H) 03/06/2024   BUN 14 03/06/2024   CREATININE 1.14 (H) 03/06/2024   CALCIUM  8.9 03/06/2024   PROT 7.1 03/06/2024   ALBUMIN  3.9 03/06/2024   AST 26 03/06/2024   ALT 17 03/06/2024   ALKPHOS 91 03/06/2024   BILITOT 0.6 03/06/2024   GFRNONAA 58 (L) 03/06/2024   GFRAA >60 09/06/2019    Lab Results  Component Value Date   WBC 3.5 (L) 03/06/2024   NEUTROABS 1.8 03/06/2024   HGB 11.9 (L) 03/06/2024   HCT 37.7 03/06/2024   MCV 99.2 03/06/2024   PLT 278 03/06/2024     STUDIES: XR HIP UNILAT W OR W/O PELVIS 1V LEFT Result Date: 05/01/2024 X-rays of the pelvis show a left total hip arthroplasty without any complications.   ASSESSMENT: Stage Ia ER/PR positive, HER2 negative invasive carcinoma of the left breast.  PLAN:    Stage Ia ER/PR positive, HER2 negative invasive carcinoma of the left breast: Patient underwent bilateral mastectomy with immediate reconstruction on Jan 18, 2023.  Oncotype Dx score was reported as 9, therefore she did not require adjuvant chemotherapy.  Because of her bilateral mastectomy, she did not require adjuvant XRT.  She was initiated on tamoxifen  in June 2024, but this was subsequently discontinued in December 2024 secondary to severe lower extremity edema.  She was  then started on letrozole  which she discontinued in June 2025 secondary to hair thinning.  Patient has now been restarted on letrozole  and will take total of 5 years of treatment completing in approximately September 2030.  Return to clinic in 3 months for routine evaluation.   Breast reconstruction: Continue follow-up with plastic surgery as scheduled. 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 Bone health: Baseline bone mineral density on March 05, 2024 was reported as normal.  Repeat in July 2027.  Continue calcium  and vitamin D supplementation. Chronic back pain/pseudoparesis: Patient underwent lumbar fusion and now has spinal stimulator placement.  Continue follow-up with orthopedics as indicated. Hair loss: Appears to be secondary to hypothyroidism.  Continue Synthroid as per primary care.  I spent a total of 20 minutes reviewing chart data, face-to-face evaluation with the patient, counseling and coordination of care as detailed above.    Patient expressed understanding and was in agreement with this plan. She also understands that She can call clinic at any time with any questions, concerns, or complaints.    Cancer Staging  Breast cancer [facility]) Staging form: Breast, AJCC 8th Edition - Clinical stage from 12/06/2022: Stage IA (cT1b, cN0, cM0, G2, ER+, PR+, HER2-) - Signed by Agrawal, Kavita, MD on 02/15/2023 Stage prefix: Initial diagnosis Histologic grading system: 3 grade system   Evalene JINNY Reusing, MD   05/19/2024 12:51 PM

## 2024-05-21 ENCOUNTER — Other Ambulatory Visit: Payer: Self-pay

## 2024-05-21 ENCOUNTER — Encounter (HOSPITAL_BASED_OUTPATIENT_CLINIC_OR_DEPARTMENT_OTHER): Payer: Self-pay | Admitting: Plastic Surgery

## 2024-05-21 ENCOUNTER — Ambulatory Visit (HOSPITAL_BASED_OUTPATIENT_CLINIC_OR_DEPARTMENT_OTHER): Admitting: Anesthesiology

## 2024-05-21 ENCOUNTER — Encounter (HOSPITAL_BASED_OUTPATIENT_CLINIC_OR_DEPARTMENT_OTHER)
Admission: RE | Disposition: A | Discharge status: Home / Self Care | Payer: Self-pay | Source: Home / Self Care | Attending: Plastic Surgery

## 2024-05-21 ENCOUNTER — Encounter

## 2024-05-21 ENCOUNTER — Ambulatory Visit (HOSPITAL_BASED_OUTPATIENT_CLINIC_OR_DEPARTMENT_OTHER)
Admission: RE | Admit: 2024-05-21 | Discharge: 2024-05-21 | Disposition: A | Discharge status: Home / Self Care | Attending: Plastic Surgery | Admitting: Plastic Surgery

## 2024-05-21 DIAGNOSIS — Z9013 Acquired absence of bilateral breasts and nipples: Secondary | ICD-10-CM

## 2024-05-21 DIAGNOSIS — N651 Disproportion of reconstructed breast: Secondary | ICD-10-CM | POA: Diagnosis not present

## 2024-05-21 DIAGNOSIS — E66813 Obesity, class 3: Secondary | ICD-10-CM | POA: Insufficient documentation

## 2024-05-21 DIAGNOSIS — Z853 Personal history of malignant neoplasm of breast: Secondary | ICD-10-CM | POA: Insufficient documentation

## 2024-05-21 DIAGNOSIS — E039 Hypothyroidism, unspecified: Secondary | ICD-10-CM | POA: Diagnosis not present

## 2024-05-21 DIAGNOSIS — R569 Unspecified convulsions: Secondary | ICD-10-CM | POA: Diagnosis not present

## 2024-05-21 DIAGNOSIS — Z01818 Encounter for other preprocedural examination: Secondary | ICD-10-CM

## 2024-05-21 DIAGNOSIS — Z8673 Personal history of transient ischemic attack (TIA), and cerebral infarction without residual deficits: Secondary | ICD-10-CM | POA: Insufficient documentation

## 2024-05-21 DIAGNOSIS — Z6841 Body Mass Index (BMI) 40.0 and over, adult: Secondary | ICD-10-CM | POA: Diagnosis not present

## 2024-05-21 DIAGNOSIS — N6489 Other specified disorders of breast: Secondary | ICD-10-CM | POA: Insufficient documentation

## 2024-05-21 HISTORY — DX: Unspecified asthma, uncomplicated: J45.909

## 2024-05-21 HISTORY — DX: Hypothyroidism, unspecified: E03.9

## 2024-05-21 HISTORY — PX: LIPOSUCTION WITH LIPOFILLING: SHX6436

## 2024-05-21 SURGERY — LIPOSUCTION, WITH FAT TRANSFER
Anesthesia: General | Site: Breast | Laterality: Bilateral

## 2024-05-21 MED ORDER — CEFAZOLIN SODIUM-DEXTROSE 3-4 GM/150ML-% IV SOLN
INTRAVENOUS | Status: AC
  Filled 2024-05-21: qty 150

## 2024-05-21 MED ORDER — LIDOCAINE HCL 1 % IJ SOLN
INTRAVENOUS | Status: DC | PRN
  Administered 2024-05-21: 500 mL

## 2024-05-21 MED ORDER — SODIUM CHLORIDE 0.9 % IV SOLN: 250.0000 mL | INTRAVENOUS | Status: DC | PRN

## 2024-05-21 MED ORDER — ONDANSETRON HCL 4 MG/2ML IJ SOLN
INTRAMUSCULAR | Status: AC
  Filled 2024-05-21: qty 2

## 2024-05-21 MED ORDER — ATROPINE SULFATE 0.4 MG/ML IV SOLN
INTRAVENOUS | Status: AC
  Filled 2024-05-21: qty 1

## 2024-05-21 MED ORDER — FENTANYL CITRATE (PF) 100 MCG/2ML IJ SOLN
INTRAMUSCULAR | Status: AC
  Filled 2024-05-21: qty 2

## 2024-05-21 MED ORDER — SUCCINYLCHOLINE CHLORIDE 200 MG/10ML IV SOSY
PREFILLED_SYRINGE | INTRAVENOUS | Status: AC
Start: 2024-05-21 — End: 2024-05-21
  Filled 2024-05-21: qty 10

## 2024-05-21 MED ORDER — ACETAMINOPHEN 325 MG PO TABS: 650.0000 mg | ORAL_TABLET | ORAL | Status: DC | PRN

## 2024-05-21 MED ORDER — DIPHENHYDRAMINE HCL 50 MG/ML IJ SOLN
INTRAMUSCULAR | Status: DC | PRN
  Administered 2024-05-21: 6.25 mg via INTRAVENOUS

## 2024-05-21 MED ORDER — DEXAMETHASONE SODIUM PHOSPHATE 4 MG/ML IJ SOLN
INTRAMUSCULAR | Status: DC | PRN
  Administered 2024-05-21: 5 mg via INTRAVENOUS

## 2024-05-21 MED ORDER — ACETAMINOPHEN 10 MG/ML IV SOLN: 1000.0000 mg | Freq: Four times a day (QID) | INTRAVENOUS | Status: DC

## 2024-05-21 MED ORDER — DEXAMETHASONE SODIUM PHOSPHATE 10 MG/ML IJ SOLN
INTRAMUSCULAR | Status: AC
  Filled 2024-05-21: qty 1

## 2024-05-21 MED ORDER — BUPIVACAINE LIPOSOME 1.3 % IJ SUSP
INTRAMUSCULAR | Status: AC
  Filled 2024-05-21: qty 40

## 2024-05-21 MED ORDER — LACTATED RINGERS IV SOLN
INTRAVENOUS | Status: AC | PRN
  Administered 2024-05-21 (×2): 1000 mL

## 2024-05-21 MED ORDER — ACETAMINOPHEN 325 MG RE SUPP: 650.0000 mg | RECTAL | Status: DC | PRN

## 2024-05-21 MED ORDER — AMISULPRIDE (ANTIEMETIC) 5 MG/2ML IV SOLN
INTRAVENOUS | Status: AC
Start: 2024-05-21 — End: 2024-05-21
  Filled 2024-05-21: qty 4

## 2024-05-21 MED ORDER — FENTANYL CITRATE (PF) 100 MCG/2ML IJ SOLN
INTRAMUSCULAR | Status: DC | PRN
  Administered 2024-05-21 (×4): 50 ug via INTRAVENOUS

## 2024-05-21 MED ORDER — SODIUM CHLORIDE 0.9% FLUSH: 3.0000 mL | INTRAVENOUS | Status: DC | PRN

## 2024-05-21 MED ORDER — LIDOCAINE HCL (PF) 1 % IJ SOLN
INTRAMUSCULAR | Status: AC
  Filled 2024-05-21: qty 120

## 2024-05-21 MED ORDER — BUPIVACAINE HCL (PF) 0.25 % IJ SOLN
INTRAMUSCULAR | Status: AC
  Filled 2024-05-21: qty 120

## 2024-05-21 MED ORDER — CHLORHEXIDINE GLUCONATE CLOTH 2 % EX PADS: 6.0000 | MEDICATED_PAD | Freq: Once | CUTANEOUS | Status: DC

## 2024-05-21 MED ORDER — LIDOCAINE 2% (20 MG/ML) 5 ML SYRINGE
INTRAMUSCULAR | Status: AC
Start: 2024-05-21 — End: 2024-05-21
  Filled 2024-05-21: qty 5

## 2024-05-21 MED ORDER — ACETAMINOPHEN 10 MG/ML IV SOLN
INTRAVENOUS | Status: AC
Start: 2024-05-21 — End: 2024-05-21
  Filled 2024-05-21: qty 100

## 2024-05-21 MED ORDER — MIDAZOLAM HCL 2 MG/2ML IJ SOLN
INTRAMUSCULAR | Status: AC
  Filled 2024-05-21: qty 2

## 2024-05-21 MED ORDER — SODIUM CHLORIDE 0.9% FLUSH: 3.0000 mL | Freq: Two times a day (BID) | INTRAVENOUS | Status: DC

## 2024-05-21 MED ORDER — PROPOFOL 500 MG/50ML IV EMUL
INTRAVENOUS | Status: DC | PRN
Start: 2024-05-21 — End: 2024-05-21
  Administered 2024-05-21: 150 ug/kg/min via INTRAVENOUS

## 2024-05-21 MED ORDER — 0.9 % SODIUM CHLORIDE (POUR BTL) OPTIME
TOPICAL | Status: DC | PRN
  Administered 2024-05-21: 1000 mL

## 2024-05-21 MED ORDER — PROPOFOL 10 MG/ML IV BOLUS
INTRAVENOUS | Status: DC | PRN
  Administered 2024-05-21: 200 mg via INTRAVENOUS

## 2024-05-21 MED ORDER — ONDANSETRON HCL 4 MG/2ML IJ SOLN
INTRAMUSCULAR | Status: DC | PRN
  Administered 2024-05-21: 4 mg via INTRAVENOUS

## 2024-05-21 MED ORDER — CEFAZOLIN SODIUM-DEXTROSE 3-4 GM/150ML-% IV SOLN
3.0000 g | INTRAVENOUS | Status: AC
  Administered 2024-05-21: 3 g via INTRAVENOUS

## 2024-05-21 MED ORDER — FENTANYL CITRATE (PF) 100 MCG/2ML IJ SOLN
25.0000 ug | INTRAMUSCULAR | Status: DC | PRN
  Administered 2024-05-21 (×3): 50 ug via INTRAVENOUS

## 2024-05-21 MED ORDER — LIDOCAINE HCL (CARDIAC) PF 100 MG/5ML IV SOSY
PREFILLED_SYRINGE | INTRAVENOUS | Status: DC | PRN
  Administered 2024-05-21: 20 mg via INTRAVENOUS

## 2024-05-21 MED ORDER — EPINEPHRINE PF 1 MG/ML IJ SOLN
INTRAMUSCULAR | Status: AC
  Filled 2024-05-21: qty 2

## 2024-05-21 MED ORDER — AMISULPRIDE (ANTIEMETIC) 5 MG/2ML IV SOLN
10.0000 mg | Freq: Once | INTRAVENOUS | Status: AC | PRN
  Administered 2024-05-21: 10 mg via INTRAVENOUS

## 2024-05-21 MED ORDER — LIDOCAINE-EPINEPHRINE 1 %-1:100000 IJ SOLN
INTRAMUSCULAR | Status: AC
  Filled 2024-05-21: qty 1

## 2024-05-21 MED ORDER — LACTATED RINGERS IV SOLN
INTRAVENOUS | Status: DC
Start: 2024-05-21 — End: 2024-05-21

## 2024-05-21 MED ORDER — EPHEDRINE 5 MG/ML INJ
INTRAVENOUS | Status: AC
  Filled 2024-05-21: qty 5

## 2024-05-21 MED ORDER — TRAMADOL HCL 50 MG PO TABS: 50.0000 mg | ORAL_TABLET | Freq: Once | ORAL | Status: DC | PRN

## 2024-05-21 MED ORDER — LIDOCAINE-EPINEPHRINE 1 %-1:100000 IJ SOLN
INTRAMUSCULAR | Status: DC | PRN
  Administered 2024-05-21: 8 mL

## 2024-05-21 MED ORDER — ACETAMINOPHEN 10 MG/ML IV SOLN
1000.0000 mg | Freq: Once | INTRAVENOUS | Status: AC
  Administered 2024-05-21: 1000 mg via INTRAVENOUS

## 2024-05-21 MED ORDER — PHENYLEPHRINE 80 MCG/ML (10ML) SYRINGE FOR IV PUSH (FOR BLOOD PRESSURE SUPPORT)
PREFILLED_SYRINGE | INTRAVENOUS | Status: AC
  Filled 2024-05-21: qty 10

## 2024-05-21 SURGICAL SUPPLY — 52 items
BINDER ABDOMINAL 10 UNV 27-48 (MISCELLANEOUS) IMPLANT
BINDER ABDOMINAL 12 ML 46-62 (SOFTGOODS) IMPLANT
BINDER ABDOMINAL 12 SM 30-45 (SOFTGOODS) IMPLANT
BINDER ABDOMINAL 9 SM 30-45 (SOFTGOODS) IMPLANT
BINDER BREAST LRG (GAUZE/BANDAGES/DRESSINGS) IMPLANT
BINDER BREAST MEDIUM (GAUZE/BANDAGES/DRESSINGS) IMPLANT
BINDER BREAST XLRG (GAUZE/BANDAGES/DRESSINGS) IMPLANT
BINDER BREAST XXLRG (GAUZE/BANDAGES/DRESSINGS) IMPLANT
BLADE HEX COATED 2.75 (ELECTRODE) IMPLANT
BLADE SURG 15 STRL LF DISP TIS (BLADE) ×1 IMPLANT
COVER BACK TABLE 60X90IN (DRAPES) ×1 IMPLANT
COVER MAYO STAND STRL (DRAPES) ×1 IMPLANT
DERMABOND ADVANCED .7 DNX12 (GAUZE/BANDAGES/DRESSINGS) ×1 IMPLANT
DRAPE LAPAROSCOPIC ABDOMINAL (DRAPES) ×1 IMPLANT
DRAPE UTILITY XL STRL (DRAPES) ×1 IMPLANT
DRESSING MEPILEX FLEX 4X4 (GAUZE/BANDAGES/DRESSINGS) IMPLANT
ELECTRODE REM PT RTRN 9FT ADLT (ELECTROSURGICAL) ×1 IMPLANT
EXTRACTOR CANIST REVOLVE STRL (CANNISTER) ×1 IMPLANT
GAUZE PAD ABD 8X10 STRL (GAUZE/BANDAGES/DRESSINGS) ×2 IMPLANT
GLOVE BIO SURGEON STRL SZ 6.5 (GLOVE) ×2 IMPLANT
GLOVE BIO SURGEON STRL SZ7.5 (GLOVE) IMPLANT
GLOVE BIOGEL PI IND STRL 7.0 (GLOVE) IMPLANT
GLOVE BIOGEL PI IND STRL 8 (GLOVE) IMPLANT
GLOVE SURG SS PI 6.5 STRL IVOR (GLOVE) IMPLANT
GOWN STRL REUS W/ TWL LRG LVL3 (GOWN DISPOSABLE) ×2 IMPLANT
GOWN STRL REUS W/ TWL XL LVL3 (GOWN DISPOSABLE) IMPLANT
IV LACTATED RINGERS 1000ML (IV SOLUTION) ×2 IMPLANT
LINER CANISTER 1000CC FLEX (MISCELLANEOUS) ×1 IMPLANT
NDL HYPO 25X1 1.5 SAFETY (NEEDLE) ×1 IMPLANT
NEEDLE HYPO 25X1 1.5 SAFETY (NEEDLE) ×1 IMPLANT
PACK BASIN DAY SURGERY FS (CUSTOM PROCEDURE TRAY) ×1 IMPLANT
PAD ALCOHOL SWAB (MISCELLANEOUS) ×1 IMPLANT
PAD FOAM SILICONE BACKED (GAUZE/BANDAGES/DRESSINGS) ×1 IMPLANT
PENCIL SMOKE EVACUATOR (MISCELLANEOUS) IMPLANT
SLEEVE SCD COMPRESS KNEE MED (STOCKING) ×1 IMPLANT
SOL PREP POV-IOD 4OZ 10% (MISCELLANEOUS) IMPLANT
SPIKE FLUID TRANSFER (MISCELLANEOUS) IMPLANT
SPONGE T-LAP 18X18 ~~LOC~~+RFID (SPONGE) ×1 IMPLANT
STRIP SUTURE WOUND CLOSURE 1/2 (MISCELLANEOUS) ×1 IMPLANT
SUT MNCRL AB 4-0 PS2 18 (SUTURE) IMPLANT
SUT MON AB 3-0 SH27 (SUTURE) IMPLANT
SUT MON AB 5-0 PS2 18 (SUTURE) ×2 IMPLANT
SYR 10ML LL (SYRINGE) ×5 IMPLANT
SYR 3ML 18GX1 1/2 (SYRINGE) IMPLANT
SYR 50ML LL SCALE MARK (SYRINGE) ×1 IMPLANT
SYR CONTROL 10ML LL (SYRINGE) ×1 IMPLANT
SYR TOOMEY 50ML (SYRINGE) IMPLANT
TOWEL GREEN STERILE FF (TOWEL DISPOSABLE) ×2 IMPLANT
TRAY DSU PREP LF (CUSTOM PROCEDURE TRAY) ×1 IMPLANT
TUBING INFILTRATION IT-10001 (TUBING) ×1 IMPLANT
TUBING SET GRADUATE ASPIR 12FT (MISCELLANEOUS) ×1 IMPLANT
UNDERPAD 30X36 HEAVY ABSORB (UNDERPADS AND DIAPERS) ×2 IMPLANT

## 2024-05-21 NOTE — Progress Notes (Signed)
 Last received tylenol  time written on dc paperwork.

## 2024-05-21 NOTE — Transfer of Care (Signed)
 Immediate Anesthesia Transfer of Care Note  Patient: YOLANDA HUFFSTETLER  Procedure(s) Performed: LIPOSUCTION, WITH FAT TRANSFER (Bilateral: Breast)  Patient Location: PACU  Anesthesia Type:General  Level of Consciousness: awake, alert , oriented, drowsy, and patient cooperative  Airway & Oxygen Therapy: Patient Spontanous Breathing and Patient connected to face mask oxygen  Post-op Assessment: Report given to RN and Post -op Vital signs reviewed and stable  Post vital signs: Reviewed and stable  Last Vitals:  Vitals Value Taken Time  BP    Temp    Pulse 77 05/21/24 09:49  Resp    SpO2 99 % 05/21/24 09:49  Vitals shown include unfiled device data.  Last Pain:  Vitals:   05/21/24 0704  TempSrc: Temporal  PainSc: 8       Patients Stated Pain Goal: 6 (05/21/24 0704)  Complications: No notable events documented.

## 2024-05-21 NOTE — Anesthesia Postprocedure Evaluation (Signed)
 Anesthesia Post Note  Patient: Vanessa Medina  Procedure(s) Performed: LIPOSUCTION, WITH FAT TRANSFER (Bilateral: Breast)     Patient location during evaluation: PACU Anesthesia Type: General Level of consciousness: awake and alert Pain management: pain level controlled Vital Signs Assessment: post-procedure vital signs reviewed and stable Respiratory status: spontaneous breathing, nonlabored ventilation, respiratory function stable and patient connected to nasal cannula oxygen Cardiovascular status: blood pressure returned to baseline and stable Postop Assessment: no apparent nausea or vomiting Anesthetic complications: no   No notable events documented.  Last Vitals:  Vitals:   05/21/24 1100 05/21/24 1126  BP: (!) 162/92 (!) 151/79  Pulse: 72 68  Resp: 12 16  Temp:  (!) 36.2 C  SpO2: 95% 97%    Last Pain:  Vitals:   05/21/24 1100  TempSrc:   PainSc: Asleep                 Epifanio Lamar BRAVO

## 2024-05-21 NOTE — Anesthesia Procedure Notes (Signed)
 Procedure Name: LMA Insertion Date/Time: 05/21/2024 8:38 AM  Performed by: Emilio Rock BIRCH, CRNAPre-anesthesia Checklist: Patient identified, Emergency Drugs available, Suction available and Patient being monitored Patient Re-evaluated:Patient Re-evaluated prior to induction Oxygen Delivery Method: Circle System Utilized Preoxygenation: Pre-oxygenation with 100% oxygen Induction Type: IV induction Ventilation: Mask ventilation without difficulty LMA: LMA inserted LMA Size: 4.0 Number of attempts: 1 Airway Equipment and Method: bite block Placement Confirmation: positive ETCO2 Tube secured with: Tape Dental Injury: Teeth and Oropharynx as per pre-operative assessment

## 2024-05-21 NOTE — Interval H&P Note (Signed)
 History and Physical Interval Note:  05/21/2024 7:57 AM  Vanessa Medina  has presented today for surgery, with the diagnosis of Breast asymmetry following reconstructive surgery.  The various methods of treatment have been discussed with the patient and family. After consideration of risks, benefits and other options for treatment, the patient has consented to  Procedure(s) with comments: LIPOSUCTION, WITH FAT TRANSFER (Bilateral) - Bilateral breast fat grafting as a surgical intervention.  The patient's history has been reviewed, patient examined, no change in status, stable for surgery.  I have reviewed the patient's chart and labs.  Questions were answered to the patient's satisfaction.     Estefana RAMAN Wallace Cogliano

## 2024-05-21 NOTE — Anesthesia Preprocedure Evaluation (Addendum)
 Anesthesia Evaluation  Patient identified by MRN, date of birth, ID band Patient awake    Reviewed: Allergy & Precautions, NPO status , Patient's Chart, lab work & pertinent test results  History of Anesthesia Complications (+) PONV and history of anesthetic complications  Airway Mallampati: II  TM Distance: >3 FB Neck ROM: Full    Dental  (+) Dental Advisory Given   Pulmonary    breath sounds clear to auscultation       Cardiovascular negative cardio ROS  Rhythm:Regular Rate:Normal     Neuro/Psych  Headaches, Seizures -,   Neuromuscular disease CVA    GI/Hepatic Neg liver ROS,GERD  ,,  Endo/Other  Hypothyroidism  Class 3 obesity  Renal/GU CRFRenal disease     Musculoskeletal  (+) Arthritis ,    Abdominal   Peds  Hematology  (+) Blood dyscrasia, anemia   Anesthesia Other Findings   Reproductive/Obstetrics                              Anesthesia Physical Anesthesia Plan  ASA: 3  Anesthesia Plan: General   Post-op Pain Management: Tylenol  PO (pre-op)*   Induction: Intravenous  PONV Risk Score and Plan: 4 or greater and Midazolam , Propofol  infusion, Dexamethasone , Ondansetron , Treatment may vary due to age or medical condition and TIVA  Airway Management Planned: LMA and Oral ETT  Additional Equipment:   Intra-op Plan:   Post-operative Plan: Extubation in OR  Informed Consent: I have reviewed the patients History and Physical, chart, labs and discussed the procedure including the risks, benefits and alternatives for the proposed anesthesia with the patient or authorized representative who has indicated his/her understanding and acceptance.     Dental advisory given  Plan Discussed with: CRNA  Anesthesia Plan Comments:          Anesthesia Quick Evaluation

## 2024-05-21 NOTE — Op Note (Signed)
 DATE OF OPERATION: 05/21/2024  LOCATION: [facility]  PREOPERATIVE DIAGNOSIS: bilateral breast asymmetry, acquired absence of breast after breast cancer treatment  POSTOPERATIVE DIAGNOSIS: Same  PROCEDURE:  Fat grafting to bilateral breasts 150 cc total  SURGEON: Cavon Nicolls, DO  EBL: none  CONDITION: Stable  COMPLICATIONS: None  INDICATION: The patient, Vanessa Medina, is a 53 y.o. female born on 06-30-1971, is here for treatment of breast asymmetry and acquired absence of breast after treatment for breast cancer.   PROCEDURE DETAILS:  The patient was seen prior to surgery and marked.  The IV antibiotics were given. The patient was taken to the operating room and given a general anesthetic. A standard time out was performed and all information was confirmed by those in the room. SCDs were placed.   The abdomen and breast were prepped and draped.  Local was placed in the medial breast incision sites and the lower abdomen.  The #15 blade was used to make a small incision.  The tumescent was infused into the abdominal fat layers.  The revolve was used to collect / harvest the adipose.  It was prepared according to the manufacture guidelines.  We were able to collect 350 cc.  The adipose was then injected into each breast with half on each side and a total of 150 cc.  All incisions were closed with the 4-0 Monocryl.  ABD and binders were placed.   The patient was allowed to wake up and taken to recovery room in stable condition at the end of the case. The family was notified at the end of the case.

## 2024-05-21 NOTE — Discharge Instructions (Addendum)
 No tylenol  or Norco before 4:15pm.   INSTRUCTIONS FOR AFTER BREAST SURGERY   You will likely have some questions about what to expect following your operation.  The following information will help you and your family understand what to expect when you are discharged from the hospital.  It is important to follow these guidelines to help ensure a smooth recovery and reduce complication.  Postoperative instructions include information on: diet, wound care, medications and physical activity.  AFTER SURGERY Expect to go home after the procedure.  In some cases, you may need to spend one night in the hospital for observation.  DIET Breast surgery does not require a specific diet.  However, the healthier you eat the better your body will heal. It is important to increasing your protein intake.  This means limiting the foods with sugar and carbohydrates.  Focus on vegetables and some meat.  If you have liposuction during your procedure be sure to drink water .  If your urine is bright yellow, then it is concentrated, and you need to drink more water .  As a general rule after surgery, you should have 8 ounces of water  every hour while awake.  If you find you are persistently nauseated or unable to take in liquids let us  know.  NO TOBACCO USE or EXPOSURE.  This will slow your healing process and lead to a wound.  WOUND CARE Leave the binder on for 3 days . Use fragrance free soap like Dial, Dove or [place].   After 3 days you can remove the binder to shower. Once dry apply binder or sports bra. If you have liposuction you will have a soft and spongy dressing (Lipofoam) that helps prevent creases in your skin.  Remove before you shower and then replace it.  It is also available on [employer]. If you have steri-strips / tape directly attached to your skin leave them in place. It is OK to get these wet.   No baths, pools or hot tubs for four weeks. We close your incision to leave the smallest and best-looking scar.  No ointment or creams on your incisions for four weeks.  No Neosporin (Too many skin reactions).  A few weeks after surgery you can use Mederma and start massaging the scar. We ask you to wear your binder or sports bra for the first 6 weeks around the clock, including while sleeping. This provides added comfort and helps reduce the fluid accumulation at the surgery site. NO Ice or heating pads to the operative site.  You have a very high risk of a BURN before you feel the temperature change.  ACTIVITY No heavy lifting until cleared by the doctor.  This usually means no more than a half-gallon of milk.  It is OK to walk and climb stairs. Moving your legs is very important to decrease your risk of a blood clot.  It will also help keep you from getting deconditioned.  Every 1 to 2 hours get up and walk for 5 minutes. This will help with a quicker recovery back to normal.  Let pain be your guide so you don't do too much.  This time is for you to recover.  You will be more comfortable if you sleep and rest with your head elevated either with a few pillows under you or in a recliner.  No stomach sleeping for a three months.  WORK Everyone returns to work at different times. As a rough guide, most people take at least 1 - 2  weeks off prior to returning to work. If you need documentation for your job, give the forms to the front staff at the clinic.  DRIVING Arrange for someone to bring you home from the hospital after your surgery.  You may be able to drive a few days after surgery but not while taking any narcotics or valium .  BOWEL MOVEMENTS Constipation can occur after anesthesia and while taking pain medication.  It is important to stay ahead for your comfort.  We recommend taking Milk of Magnesia (2 tablespoons; twice a day) while taking the pain pills.  MEDICATIONS You may be prescribed should start after surgery At your preoperative visit for you history and physical you were given the following  medications: Antibiotic: Start this medication when you get home and take according to the instructions on the bottle. Zofran  4 mg:  This is to treat nausea and vomiting.  You can take this every 6 hours as needed and only if needed. Hydrocodone  as prescribed. This is to be used after you have taken the the Tylenol .  4.   Gabapentin  300 mg every 12 hours for 7 days.  Over the counter Medication to take: Ibuprofen (Motrin) 400 - 600 mg every 6 hour for 7 days- Patient states she cannot take. Tylenol  500 mg every 6 hours for 7 days.  Only take the Hydrocodone  after you have tried these two. MiraLAX  or stool softener of choice: Take this according to the bottle if you take the Norco.(Hydrocodone )  If muscle work done:  Flexeril  5 mg every 12 hours for 7 days.  WHEN TO CALL Call your surgeon's office if any of the following occur: Fever 101 degrees F or greater Excessive bleeding or fluid from the incision site. Pain that increases over time without aid from the medications Redness, warmth, or pus draining from incision sites Persistent nausea or inability to take in liquids Severe misshapen area that underwent the operation.  Post Anesthesia Home Care Instructions  Activity: Get plenty of rest for the remainder of the day. A responsible individual must stay with you for 24 hours following the procedure.  For the next 24 hours, DO NOT: -Drive a car -Advertising copywriter -Drink alcoholic beverages -Take any medication unless instructed by your physician -Make any legal decisions or sign important papers.  Meals: Start with liquid foods such as gelatin or soup. Progress to regular foods as tolerated. Avoid greasy, spicy, heavy foods. If nausea and/or vomiting occur, drink only clear liquids until the nausea and/or vomiting subsides. Call your physician if vomiting continues.  Special Instructions/Symptoms: Your throat may feel dry or sore from the anesthesia or the breathing tube placed  in your throat during surgery. If this causes discomfort, gargle with warm salt water . The discomfort should disappear within 24 hours.  If you had a scopolamine  patch placed behind your ear for the management of post- operative nausea and/or vomiting:  1. The medication in the patch is effective for 72 hours, after which it should be removed.  Wrap patch in a tissue and discard in the trash. Wash hands thoroughly with soap and water . 2. You may remove the patch earlier than 72 hours if you experience unpleasant side effects which may include dry mouth, dizziness or visual disturbances. 3. Avoid touching the patch. Wash your hands with soap and water  after contact with the patch.    May take Tylenol  after 5pm, if needed.

## 2024-05-22 ENCOUNTER — Encounter (HOSPITAL_BASED_OUTPATIENT_CLINIC_OR_DEPARTMENT_OTHER): Payer: Self-pay | Admitting: Plastic Surgery

## 2024-05-23 NOTE — Telephone Encounter (Addendum)
 I reached out to see if someone is available. Waiting on a response.

## 2024-05-26 ENCOUNTER — Ambulatory Visit (INDEPENDENT_AMBULATORY_CARE_PROVIDER_SITE_OTHER): Admitting: Neurosurgery

## 2024-05-26 DIAGNOSIS — R296 Repeated falls: Secondary | ICD-10-CM | POA: Diagnosis not present

## 2024-05-26 DIAGNOSIS — Z9689 Presence of other specified functional implants: Secondary | ICD-10-CM

## 2024-05-26 DIAGNOSIS — M549 Dorsalgia, unspecified: Secondary | ICD-10-CM

## 2024-05-26 NOTE — Progress Notes (Addendum)
    HISTORY OF PRESENT ILLNESS: 05/26/2024 [name] is status post thoracic laminectomy for spinal cord stimulator placement.  She is originally having very good coverage in her bilateral lower extremities.  She did not have good coverage of her mid back pain.  She is being currently worked up for orthopedic issues regarding her hip.SABRA   PHYSICAL EXAMINATION:  General: Patient is well developed, well nourished, calm, collected, and in no apparent distress.  NEUROLOGICAL:  General: In no acute distress.  Awake, alert, oriented to person, place, and time. Pupils equal round and reactive to light.   Strength: Some pain limitation especially in the right hip, no major deficits noted  Incision c/d/i, superficial battery  ROS (Neurologic): Negative except as noted above  IMAGING: Awaiting x-rays  ASSESSMENT/PLAN:  Vanessa Medina is here today after spinal cord stimulator placement and thoracic spine with paddle lead.  She was originally doing well with very good coverage in her bilateral lower extremities.  She has had some multiple falls and has never had good coverage of her back pain despite the good leg coverage.  I will plan to get x-rays of her system to make sure that it continues to be in continuity given her multiple previous falls.  I will also follow-up with the stimulator rep as they are going to interrogate to see whether or not there is good connection between the leads and the stimulator.  She is having some difficulty with recharging so I like to make sure that there is no discontinuity.   Vanessa MICAEL Sharps, MD/MS Department of Neurosurgery   Addendum: 06/12/2024 -we are able to obtain the results of her x-rays, did not show any evidence of hardware malfunction.  She also had her unit interrogated and some still getting good signals.  She does have a mobile and painful IPG site that is causing difficulty with recharging unfortunately after multiple falls.  We  discussed the risks and benefits of battery site revision, she would like to go forward with the procedure as she is having significant pain with the mobile location.  Will plan to go forward with a left-sided battery site revision given the painful placement at this point that is not improving with conservative care and observation.

## 2024-05-28 ENCOUNTER — Encounter

## 2024-05-29 ENCOUNTER — Ambulatory Visit: Admitting: Neurosurgery

## 2024-05-30 ENCOUNTER — Ambulatory Visit: Admitting: Plastic Surgery

## 2024-05-30 ENCOUNTER — Encounter: Payer: Self-pay | Admitting: Plastic Surgery

## 2024-05-30 VITALS — BP 142/87 | HR 79

## 2024-05-30 DIAGNOSIS — G8222 Paraplegia, incomplete: Secondary | ICD-10-CM

## 2024-05-30 DIAGNOSIS — Z170 Estrogen receptor positive status [ER+]: Secondary | ICD-10-CM

## 2024-05-30 DIAGNOSIS — Z9013 Acquired absence of bilateral breasts and nipples: Secondary | ICD-10-CM

## 2024-05-30 DIAGNOSIS — C50812 Malignant neoplasm of overlapping sites of left female breast: Secondary | ICD-10-CM

## 2024-05-30 NOTE — Progress Notes (Signed)
 The patient is a 53 year old female here for follow-up after undergoing surgery on September 17.  She had fat grafting to both breasts with 150 cc of fat for improved symmetry.  She was also recently seen by Dr. Claudene for her spinal cord stimulator.  The fat grafting areas of the breast are slightly bruised but overall look really good.  No sign of infection.  She has a changing skin lesion on her face and 1 on her left breast that I am really concerned about.  They look like they could be basal cell carcinoma.  Will get her scheduled to get those off.  Pictures were obtained of the patient and placed in the chart with the patient's or guardian's permission.

## 2024-06-02 ENCOUNTER — Other Ambulatory Visit: Admitting: Urology

## 2024-06-03 ENCOUNTER — Ambulatory Visit
Admission: RE | Admit: 2024-06-03 | Discharge: 2024-06-03 | Disposition: A | Discharge status: Home / Self Care | Source: Ambulatory Visit | Attending: Neurosurgery | Admitting: Neurosurgery

## 2024-06-03 ENCOUNTER — Ambulatory Visit
Admission: RE | Admit: 2024-06-03 | Discharge: 2024-06-03 | Disposition: A | Discharge status: Home / Self Care | Attending: Neurosurgery | Admitting: Neurosurgery

## 2024-06-03 DIAGNOSIS — Z9689 Presence of other specified functional implants: Secondary | ICD-10-CM

## 2024-06-04 ENCOUNTER — Encounter

## 2024-06-10 ENCOUNTER — Ambulatory Visit (INDEPENDENT_AMBULATORY_CARE_PROVIDER_SITE_OTHER): Admitting: Surgical

## 2024-06-10 ENCOUNTER — Ambulatory Visit: Payer: Self-pay | Admitting: Neurosurgery

## 2024-06-10 VITALS — BP 136/86 | HR 76

## 2024-06-10 DIAGNOSIS — Z9013 Acquired absence of bilateral breasts and nipples: Secondary | ICD-10-CM

## 2024-06-10 DIAGNOSIS — C50812 Malignant neoplasm of overlapping sites of left female breast: Secondary | ICD-10-CM

## 2024-06-10 DIAGNOSIS — Z170 Estrogen receptor positive status [ER+]: Secondary | ICD-10-CM

## 2024-06-10 DIAGNOSIS — L989 Disorder of the skin and subcutaneous tissue, unspecified: Secondary | ICD-10-CM

## 2024-06-10 DIAGNOSIS — Z9689 Presence of other specified functional implants: Secondary | ICD-10-CM

## 2024-06-10 NOTE — Progress Notes (Signed)
 53 year old female here for follow-up after fat grafting to bilateral breasts on 05/21/2024 with Dr. Lowery.  Patient reports she is overall doing well from a fat grafting and liposuction standpoint.  She is feeling well and healing well without any specific concerns.  She does report she is having some issues with her back and has to go undergo an additional back surgery.  She is not having any infectious symptoms.  She is overall pleased with the improvement in the contour of her breasts  Chaperone present on exam On exam bilateral breast incisions are intact, no ecchymosis or significant swelling is noted of either breast.  Superior poles with improved contour.  There is no erythema or cellulitic changes noted.  Breasts are soft, no fat necrosis noted with palpation.  Abdomen with some mild bruising noted along the right side.  There is no significant swelling.  Minimal tenderness to palpation.  No erythema or cellulitic changes.  A/P  Patient is overall doing well from liposuction and fat grafting 3 weeks ago.  She does not have any specific concerns.  We discussed that she will continue to notice changes as swelling goes down and fat either obtains healthy blood supply or becomes reabsorbed.  We will plan to see her back in a few weeks for reevaluation.  Of note, she is also going to be scheduled with Dr. Lowery for excision of a left face skin lesion and left breast skin lesion.  She previously had the left breast skin lesion excised, but it seems to have recurred.  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  No malignancy was seen.  Reviewed EMR, route  was sent on 05/30/2024.  Currently under review.

## 2024-06-11 ENCOUNTER — Ambulatory Visit: Attending: Podiatry

## 2024-06-11 DIAGNOSIS — R278 Other lack of coordination: Secondary | ICD-10-CM | POA: Insufficient documentation

## 2024-06-11 DIAGNOSIS — R2689 Other abnormalities of gait and mobility: Secondary | ICD-10-CM | POA: Insufficient documentation

## 2024-06-11 DIAGNOSIS — I972 Postmastectomy lymphedema syndrome: Secondary | ICD-10-CM | POA: Insufficient documentation

## 2024-06-11 DIAGNOSIS — M6281 Muscle weakness (generalized): Secondary | ICD-10-CM | POA: Diagnosis present

## 2024-06-11 DIAGNOSIS — L905 Scar conditions and fibrosis of skin: Secondary | ICD-10-CM | POA: Diagnosis present

## 2024-06-11 DIAGNOSIS — G8929 Other chronic pain: Secondary | ICD-10-CM | POA: Insufficient documentation

## 2024-06-11 DIAGNOSIS — M545 Low back pain, unspecified: Secondary | ICD-10-CM | POA: Insufficient documentation

## 2024-06-11 DIAGNOSIS — R262 Difficulty in walking, not elsewhere classified: Secondary | ICD-10-CM | POA: Insufficient documentation

## 2024-06-11 DIAGNOSIS — M5459 Other low back pain: Secondary | ICD-10-CM | POA: Insufficient documentation

## 2024-06-11 DIAGNOSIS — M79671 Pain in right foot: Secondary | ICD-10-CM | POA: Diagnosis present

## 2024-06-11 DIAGNOSIS — R2681 Unsteadiness on feet: Secondary | ICD-10-CM | POA: Insufficient documentation

## 2024-06-11 DIAGNOSIS — M25512 Pain in left shoulder: Secondary | ICD-10-CM | POA: Diagnosis present

## 2024-06-11 NOTE — Therapy (Signed)
 OUTPATIENT PHYSICAL THERAPY THORACOLUMBAR/ LOWER EXTREMITY TREATMENT   Patient Name: Vanessa Medina MRN: [id number] DOB:[birth year], 53 y.o., female Today's Date: 06/11/2024  END OF SESSION:  PT End of Session - 06/11/24 1021     Visit Number 8    Number of Visits 31    Date for Recertification  07/16/24    Authorization Type 27 allowed visits    Progress Note Due on Visit 10    PT Start Time 1016    Equipment Utilized During Treatment Gait belt    Activity Tolerance Patient tolerated treatment well;Treatment limited secondary to medical complications (Comment)    Behavior During Therapy [facility] for tasks assessed/performed              Past Medical History:  Diagnosis Date   Acute nontraumatic kidney injury    Anemia    Anxiety    Asthma    Benign cyst of kidney    Breast cancer (HCC) 01/18/2023   left breast-bil mastectomies   Cervical cancer (HCC)    Chronic back pain    Chronic hypotension    Chronic kidney disease    Previous now clear   Claustrophobia    Decompression injury of spinal cord    Depression    Dizziness    Failed spinal cord stimulator    Family history of adverse reaction to anesthesia      my mother takes a long time time wake up   History of dysplastic nevus 01/18/2018   right shoulder, recurrent dysplastic nevus   History of kidney stones    History of shingles    Hypercholesteremia    Hypothyroidism    Inappropriate sinus node tachycardia    Migraine    Morbid obesity (HCC)    Multiple allergies    Neurogenic orthostatic hypotension (HCC)    Obesity    Osteoarthritis    left hip   Pneumonia    PONV (postoperative nausea and vomiting)    delayed emergence   S/P insertion of spinal cord stimulator 01/24/2024   Seizure (HCC) 2021   x1-after a back surgery with spinal cord stimulator   Slow to wake up after anesthesia    Stroke (HCC) 08/11/2020   spinal cord stroke, spinal cord puncture during surgery   Wears glasses     Past Surgical History:  Procedure Laterality Date   ABDOMINAL HYSTERECTOMY     APPENDECTOMY     BACK SURGERY     BILIOPANCREATIC DIVISION W DUODENAL SWITCH N/A    BREAST BIOPSY Left 12/06/2022   u/s bx,1:00 heart clip, path pending   BREAST BIOPSY Left 12/06/2022   us  bx 2:00 ribbon clip path pending   BREAST BIOPSY Left 12/06/2022   US  LT BREAST BX W LOC DEV EA ADD LESION IMG BX SPEC US  GUIDE 12/06/2022 [facility]   BREAST BIOPSY Left 12/06/2022   US  LT BREAST BX W LOC DEV 1ST LESION IMG BX SPEC US  GUIDE 12/06/2022 [facility]   BREAST RECONSTRUCTION WITH PLACEMENT OF TISSUE EXPANDER AND FLEX HD (ACELLULAR HYDRATED DERMIS) Bilateral 01/18/2023   Procedure: IMMEDIATE LEFT BREAST RECONSTRUCTION WITH PLACEMENT OF TISSUE EXPANDER AND FLEX HD (ACELLULAR HYDRATED DERMIS);  Surgeon: [name], DO;  Location: [facility];  Service: Plastics;  Laterality: Bilateral;   BREAST RECONSTRUCTION WITH PLACEMENT OF TISSUE EXPANDER AND FLEX HD (ACELLULAR HYDRATED DERMIS) Left 03/26/2023   Procedure: BREAST RECONSTRUCTION WITH PLACEMENT OF TISSUE EXPANDER;  Surgeon: [name], DO;  Location: [facility];  Service:  Plastics;  Laterality: Left;   CHOLECYSTECTOMY     COLONOSCOPY N/A 06/27/2021   Procedure: COLONOSCOPY;  Surgeon: Onita Elspeth Sharper, DO;  Location: [facility] ENDOSCOPY;  Service: Gastroenterology;  Laterality: N/A;   COLONOSCOPY N/A 08/27/2023   Procedure: COLONOSCOPY;  Surgeon: Onita Elspeth Sharper, DO;  Location: [facility] ENDOSCOPY;  Service: Gastroenterology;  Laterality: N/A;   DIAGNOSTIC LAPAROSCOPY     LOA   ESOPHAGOGASTRODUODENOSCOPY N/A 06/27/2021   Procedure: ESOPHAGOGASTRODUODENOSCOPY (EGD);  Surgeon: Onita Elspeth Sharper, DO;  Location: [facility] ENDOSCOPY;  Service: Gastroenterology;  Laterality: N/A;   ESOPHAGOGASTRODUODENOSCOPY (EGD) WITH PROPOFOL  N/A 07/25/2017   Procedure: ESOPHAGOGASTRODUODENOSCOPY (EGD) WITH PROPOFOL ;  Surgeon: Viktoria Lamar DASEN, MD;  Location: [facility] ENDOSCOPY;  Service: Endoscopy;  Laterality: N/A;   HERNIA REPAIR     KNEE ARTHROSCOPY Right    LAPAROSCOPIC GASTRIC SLEEVE RESECTION WITH HIATAL HERNIA REPAIR     LIPOSUCTION WITH LIPOFILLING Bilateral 05/21/2024   Procedure: LIPOSUCTION, WITH FAT TRANSFER;  Surgeon: [name], DO;  Location: [facility];  Service: Plastics;  Laterality: Bilateral;  Bilateral breast fat grafting   LUMBAR FUSION  08/29/2022   Revision Lumbar two through five Laminectomy & Fusion with Transforaminal Lumbar interbody fusion   MASTECTOMY W/ SENTINEL NODE BIOPSY Bilateral 01/18/2023   Procedure: MASTECTOMY WITH SENTINEL LYMPH NODE BIOPSY, RNFA to assist;  Surgeon: Jordis Laneta FALCON, MD;  Location: [facility];  Service: General;  Laterality: Bilateral;   PULSE GENERATOR IMPLANT N/A 01/24/2024   Procedure: UNILATERAL PULSE GENERATOR IMPLANT;  Surgeon: [name] ORN, MD;  Location: [facility];  Service: Neurosurgery;  Laterality: N/A;  INTERNAL PULSE GENERATOR PLACEMENT FOR SPINAL CORD STIMULATOR   REMOVAL OF TISSUE EXPANDER AND PLACEMENT OF IMPLANT Bilateral 06/06/2023   Procedure: REMOVAL OF TISSUE EXPANDER AND PLACEMENT OF IMPLANT;  Surgeon: [name], DO;  Location:  SURGERY CENTER;  Service: Plastics;  Laterality: Bilateral;   SPINAL CORD STIMULATOR IMPLANT     THORACIC LAMINECTOMY FOR SPINAL CORD STIMULATOR N/A 01/15/2024   Procedure: THORACIC LAMINECTOMY FOR SPINAL CORD STIMULATOR;  Surgeon: [name] ORN, MD;  Location: [facility];  Service: Neurosurgery;  Laterality: N/A;  THORACIC LAMINECTOMY FOR SPINAL CORD STIMULATOR PADDLE TRIAL   TONSILLECTOMY     TOTAL HIP ARTHROPLASTY Left 01/26/2016   Procedure: LEFT TOTAL HIP ARTHROPLASTY ANTERIOR APPROACH;  Surgeon: Kay CHRISTELLA Cummins, MD;  Location: [facility];  Service: Orthopedics;  Laterality: Left;   TRANSFORAMINAL LUMBAR INTERBODY FUSION (TLIF) WITH PEDICLE SCREW FIXATION 3 LEVEL  08/2019   Lynwood Better, MD  L2-L5   Patient Active Problem List   Diagnosis Date Noted   Breast asymmetry following reconstructive surgery 03/25/2024   S/P insertion of spinal cord stimulator 03/21/2024   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   Pain and swelling of lower leg 09/25/2023   Lymphedema 09/25/2023   Long term (current) use of aromatase inhibitors 09/07/2023   At risk for loss of bone density 09/07/2023   Changing skin lesion 09/04/2023   Frequent falls 08/22/2023   Chronic pain syndrome 08/22/2023   Bilateral lower extremity edema 08/06/2023   Back pain 08/06/2023   Claustrophobia    Pernicious anemia 03/14/2023   Ruptured left breast implant 02/13/2023   S/P mastectomy, bilateral 01/18/2023   Genetic testing 12/18/2022   Breast cancer (HCC) 12/08/2022   Family history of breast cancer 12/08/2022   Profound fatigue 12/08/2022   Lumbar post-laminectomy syndrome 06/02/2022   Pseudarthrosis after fusion or arthrodesis 05/19/2022   Primary osteoarthritis of right knee  03/28/2022   Subjective tinnitus of both ears 12/21/2021   Abnormality of gait 04/29/2021   Abnormal sensation in both ears 04/06/2021   Other adverse food reactions, not elsewhere classified, subsequent encounter 04/06/2021   Multiple drug allergies 04/06/2021   Insomnia due to medical condition 02/25/2021   Myofascial muscle pain 02/25/2021   Nerve pain 10/18/2020   Incomplete paraplegia (HCC) 10/18/2020   Orthostatic hypotension 10/18/2020   Renal cyst 09/23/2020   Chronic migraine without aura without status migrainosus, not intractable    Hypokalemia    Adjustment reaction with anxiety and depression    Spinal cord injury, lumbar, without spinal bone injury, sequela 08/18/2020   Paraparesis (HCC)    Post-operative pain    Hypotension    Slow transit constipation    AKI (acute kidney injury)    Right leg weakness 08/11/2020   DDD (degenerative disc disease), lumbar 09/02/2019    Class: Chronic   Degenerative disc  disease, lumbar 09/02/2019   Chest tightness 09/23/2018   Bradycardia 09/23/2018   BMI 40.0-44.9, adult (HCC) 04/17/2018   Status post bariatric surgery 04/17/2018   Labile blood pressure 03/08/2018   Chronic low back pain 09/03/2017   History of total hip replacement, left 01/26/2016   Osteoarthritis of spine with radiculopathy, lumbar region 10/16/2014   LEG PAIN, BILATERAL 12/16/2007   Malignant neoplasm of cervix uteri (HCC) 07/02/2007   MORBID OBESITY 07/02/2007   DEPRESSION 07/02/2007   Migraine without aura 07/02/2007   Other allergic rhinitis 07/02/2007   Asthma 07/02/2007   GERD 07/02/2007   ELEVATED BLOOD PRESSURE WITHOUT DIAGNOSIS OF HYPERTENSION 07/02/2007   Asthma 07/02/2007    PCP: Lynwood Null, MD  REFERRING PROVIDER: Juliene Medicine, DPM; Lyle Decamp, PA-C  REFERRING DIAG:  [phone] (ICD-10-CM) - Peroneal tendinitis of right lower extremity   Z96.89 (ICD-10-CM) - S/P insertion of spinal cord stimulator   Rationale for Evaluation and Treatment: Rehabilitation  THERAPY DIAG:  Muscle weakness (generalized)  Chronic midline low back pain, unspecified whether sciatica present  Other low back pain  Difficulty in walking, not elsewhere classified  Other lack of coordination  Unsteadiness on feet  Other abnormalities of gait and mobility  Pain in right foot  Left shoulder pain, unspecified chronicity  Chronic left shoulder pain  Scar tissue  Post-mastectomy lymphedema syndrome  ONSET DATE: Spinal cord stimulator placed on 01/24/24 Foot pain began around 6 months ago  SUBJECTIVE:  SUBJECTIVE STATEMENT: Patient reports has now completed another surgery-liposuction with fat transfer for breast reconstruction surgery. She states she still plans to have R foot surgery in  December 2025. States having issues Spinal cord stimulator and now battery imbedded has been dislodged and will need adjustment. Rates LBP=8/10. States just taking one day at a time.    PERTINENT HISTORY:  Pt is well-known to this clinic. Underwent placement for permanent spinal cord stimulator on 01/24/24 after a successful trial. Pt with a history of diagnosed chronic pain syndrome and failed back surgery syndrome. Pt has a history of plantar fasciitis and prior foot surgeries and was seen by Podiatrist on 6/11 and was given diagnosis of peroneal tendonitis. Per Podiatry note: Insertional peroneal tendonitis. Has had some improvement with corticosteroid injection.  Recommended formal physical therapy.  Referral was sent.  Return in 6 weeks.  If no improvement recommend MRI Patient has noticed recent increase in hair loss, will return to oncology physician next week to address concerns.   PMH includes anemia, anxiety, breast cancer of L breast, cervical cancer, chronic back pain, chronic hypotension, CKD (now clear), claustrophobia, decompression injury of spinal cord, depression, dizziness, failed spinal cord stimulator, hypercholesteremia, inappropriate sinus node tachycardia, migraine, morbid obesity, neurogenic orthostatic hypotension, OA of L hip, pneumonia, seizure, spinal cord stroke  See chart for full past surgical history  PAIN: Reporting on R foot pain: Are you having pain? Yes: NPRS scale: 3/10 currently, 7/10 at worst, 1/10 Pain location: On the top/lateral aspect of her R foot Pain description: throbbing, stabbing Aggravating factors: walking after about 40 minutes Relieving factors: getting off of it, elevating it  PRECAUTIONS: Back - until 6 weeks post-op (July 3rd, 2025)- pt able to recall precautions without cueing: no bending, lifting, twisting   RED FLAGS: None   WEIGHT BEARING RESTRICTIONS: No  FALLS:  Has patient fallen in last 6 months? Yes. 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  DIAGNOSTIC FINDINGS:  Right foot radiograph: enthesopathy at the fifth metatarsal base (12/31/2023)   PATIENT SURVEYS:  mODI: 36 / 50 = 72.0 %  FADI: 24/50- 48%  COGNITION: Overall cognitive status: Within functional limits for tasks assessed     SENSATION: WFL  MUSCLE LENGTH: Deferred at eval due to back precautions Hamstrings:  Thomas test:   POSTURE: rounded shoulders and forward head  PALPATION: Tender on palpation of base of 5th metatarsal and along lateral aspect of R foot  LUMBAR ROM: *Not tested at eval due to back precautions*  AROM eval  Flexion   Extension   Right lateral flexion   Left lateral flexion   Right rotation   Left rotation    (Blank rows = not tested)  LOWER EXTREMITY ROM:     Active  Right eval Left eval  Ankle dorsiflexion 4 7  Ankle plantarflexion 42 50  Ankle inversion 15 25  Ankle eversion 5 15   (Blank rows = not tested)  LOWER EXTREMITY MMT:    MMT Right eval Left eval  Hip flexion 4+ 4+  Hip extension    Hip abduction 4- 4-  Hip adduction 4+ 4+  Hip internal rotation    Hip external rotation    Knee flexion 4- 4-  Knee extension 3+ 4+  Ankle dorsiflexion 4- 4+  Ankle plantarflexion 4- 4+  Ankle inversion    Ankle eversion     (Blank rows = not tested)  LUMBAR  SPECIAL TESTS:  Deferred due to back precautions  FUNCTIONAL TESTS:  5 times sit to stand: 24.80 seconds with UE use on armchair 10 meter walk test: 13.52seconds (0.74 m/s) FGA: 13/30 on 7/9  GAIT: Distance walked: approx. 182ft Assistive device utilized: None Level of assistance: Modified independence Comments: Pt demonstrated step-through pattern throughout with decreased stance time on R LE and mild Trendelenburg compensatory gait pattern on R. Antalgic gait present throughout with narrowed BOS  TREATMENT DATE: 06/11/24   Deferred any ankle treatment - Patient going to have ankle Surgery in [place].  Deferred any supine Low back activity due to battery of [employer] stimulator malaligned.    Low back pain- 8/10    TA:  Core activation:  Abdominal core activation- seated knee ext 3# AW-2 x 10 reps ea LE Abdominal core activation- seated hip march  2x 10 reps (VC for breathing and activating core)  Seated  core activation- Hip abd /flex(leg swings straight leg) up/over x 10 reps each LE Seated ham curl with GTB 2 x 10 reps  Seated Hip add - ball squeeze with core activation x 5 sec x 10 Seated Hip IR- ball squeeze with knees while separating feet apart x 10 reps     PATIENT EDUCATION:  Education details: Education provided anatomy of ankle and anatomy of core and how to engage. Instructed in below HEP. Person educated: Patient Education method: Explanation Education comprehension: verbalized understanding  HOME EXERCISE PROGRAM:  Access Code: [email or web address] URL: [email or web address] Date: 04/09/2024 Prepared by: Reyes London  Exercises - Supine Transversus Abdominis Bracing - Hands on Ground  - 3 x weekly - 2 sets - 10 reps - 5 hold - Supine Transversus Abdominis Bracing with Leg Extension  - 3 x weekly - 3 sets - 10 reps - Supine Bridge  - 3 x weekly - 3 sets - 10 reps - Clamshell  - 3 x weekly - 3 sets - 10 reps - Sidelying Hip Abduction  - 3 x weekly -  3 sets - 10 reps       Access Code: [email or web address] URL: [email or web address] Date: 03/19/2024 Prepared by: Reyes London  Exercises - Sidelying Ankle Eversion Strengthening with Ankle Weight  - 3 x weekly - 3 sets - 10 reps - Toe Yoga - Alternating Great Toe and Lesser Toe Extension  - 3 x weekly - 3 sets - 10 reps - Towel Scrunches  - 1 x daily - 3 x weekly - 3 sets - 10 reps - Toe Spreading  - 3 x weekly - 3 sets - 10 reps    Access Code: [email or web address]  URL: [email or web address]  Date: 03/12/2024  repared by: Marina  Moser  Exercises: - Seated Ankle Plantarflexion with Resistance  - 1 x daily - 7 x weekly - 2 sets - 10 reps - 5 hold  - Seated Ankle Eversion with Resistance  - 1 x daily - 7 x weekly -  2 sets - 10 reps - 5 hold  - Seated Ankle Inversion with Resistance and Legs Crossed  - 1 x daily - 7 x weekly - 2 sets - 10 reps - 5 hold  - Seated Ankle Dorsiflexion with Resistance  - 1 x daily - 7 x weekly - 2 sets - 10 reps - 5 hold  - Seated Ankle Alphabet  - 1 x daily - 7 x weekly - 2 sets - 10 reps - 5 hold - Supine Posterior Pelvic Tilt  - 1 x daily - 7 x weekly - 2 sets - 10 reps - 5 hold  - Supine Bridge with Pelvic Floor Contraction  - 1 x daily - 7 x weekly - 2 sets - 10 reps - 5 hold   ASSESSMENT:  CLINICAL IMPRESSION: Treatment very limited as patient continues to have multiple medical issues currently. She just had breast and lipo surgery and is need of f/u with MD due to issue with battery malaligned. The patient is also waiting on Right foot surgery so limited in positions and activities. She fatigued quickly with activities for LE strengthening but no worsening of LBP and did not press walking due to R foot pain. Will follow up with patient again next week and try to progress her strength and core as able. Pt would benefit from skilled PT intervention to address listed deficits and improve strength, balance, weight bearing tolerance, and  overall mobility status.   OBJECTIVE IMPAIRMENTS: Abnormal gait, decreased activity tolerance, decreased balance, decreased endurance, decreased mobility, difficulty walking, decreased ROM, decreased strength, hypomobility, impaired perceived functional ability, impaired flexibility, improper body mechanics, postural dysfunction, and pain.   ACTIVITY LIMITATIONS: carrying, lifting, bending, sitting, standing, squatting, stairs, transfers, bed mobility, bathing, toileting, hygiene/grooming, and locomotion level  PARTICIPATION LIMITATIONS: meal prep, cleaning, laundry, driving, shopping, community activity, and occupation  PERSONAL FACTORS: Age, Past/current experiences, and 3+ comorbidities: anemia, anxiety, breast cancer of L breast, cervical cancer, chronic back pain, chronic hypotension, CKD (now clear), claustrophobia, decompression injury of spinal cord, depression, dizziness, failed spinal cord stimulator, hypercholesteremia, inappropriate sinus node tachycardia, migraine, morbid obesity, neurogenic orthostatic hypotension, OA of L hip, pneumonia, seizure, spinal cord stroke are also affecting patient's functional outcome.   REHAB POTENTIAL: Good  CLINICAL DECISION MAKING: Evolving/moderate complexity  EVALUATION COMPLEXITY: Moderate   GOALS: Goals reviewed with patient? Yes  SHORT TERM GOALS: Target date: 05/24/2024  1. Patient will be independent in home exercise program to improve strength/mobility for better functional independence with ADLs. Baseline: give next session; 04/23/2024- Patient reports compliant with current LE stretching/ROM. Will continue with progressive core  Goal status: PROGRESSING  LONG TERM GOALS: Target date: 07/16/2024   Pt will increase by at least 0.13 m/s in order to demonstrate clinically significant improvement in community ambulation.   Baseline: 0.74 without AD Goal status: GOAL not appropriate right now due to known R tendon tear and going to  have surgery in Dec.  2. Pt will decrease 5TSTS by at least 3 seconds in order to demonstrate clinically significant improvement in LE strength  Baseline: 24.8 seconds with UE use on armrests  Goal status: GOAL not appropriate right now due to known R tendon tear and going to have surgery in Dec.  3.  Pt will decrease mODI scoreby at least 13 points in order demonstrate clinically significant reduction in pain/disability  Baseline: 36 / 50 = 72.0 % on 7/9; 04/23/2024= 60% Goal status: PROGRESSING  4. Pt will increase by  at least 83m (128ft) in order to demonstrate clinically significant improvement in cardiopulmonary endurance and community ambulation  Baseline: Assess next session; 03/19/24= 550 feet without AD Goal status: GOAL not appropriate right now due to known R tendon tear and going to have surgery in Dec.   5.  Patient will increase Functional Gait Assessment score to >20/30 as to reduce fall risk and improve dynamic gait safety with community ambulation. Baseline: 13/30 on 7/9 Goal status: GOAL not appropriate right now due to known R tendon tear and going to have surgery in Dec.  6. Patient will increase BLE gross strength to 4+/5 as to improve functional strength for independent gait, increased standing tolerance and increased ADL ability.  Baseline: see MMT chart above  Goal status: INITIAL  7. Pt will decrease worst back pain as reported on NPRS by at least 2 points in order to demonstrate clinically significant reduction in back pain.   Baseline: Current 7/10 on 04/23/2024  Goal status: New   8. Patient will verbalize and demonstrate proper body mechanics with homemaking related activities to decrease risk of injury.   Baseline: On restrictions as she has stimulator in spine. Patient reports limited by lifting precautions- limited knowledge of ways to protect spine.   Goal status: New    9. Patient will report sleeping > 4 hours/per night consistently- 5/7 nights per  week for improved functioning and rest at night  Baseline: Patient reports having increased difficulty sleeping more than 3-4 hours per night consistently  Goal status: New PLAN:  PT FREQUENCY: 1-2x/week  PT DURATION: 12 weeks  PLANNED INTERVENTIONS: [id number]- PT Re-evaluation, 97750- Physical Performance Testing, 97110-Therapeutic exercises, 97530- Therapeutic activity, [id number]- Neuromuscular re-education, 97535- Self Care, [id number]- Manual therapy, [id number]- Gait training, [id number]- Orthotic Initial, [id number]- Orthotic/Prosthetic subsequent, [phone]- Canalith repositioning, [id number]- Splinting, H9716- Electrical stimulation (unattended), [phone]- Electrical stimulation (manual), [id number]- Ultrasound, [id number]- Traction (mechanical), [id number] (1-2 muscles), [id number] (3+ muscles)- Dry Needling, Patient/Family education, Balance training, Stair training, Taping, Joint mobilization, Joint manipulation, Spinal mobilization, Scar mobilization, Compression bandaging, Vestibular training, Visual/preceptual remediation/compensation, Cognitive remediation, DME instructions, Cryotherapy, Moist heat, and Biofeedback.  PLAN FOR NEXT SESSION: Continue with core/low back strengthening in seated and standing Review activities added to HEP and progress as appropriate.        Reyes LOISE London, PT 06/11/2024, 11:54 AM

## 2024-06-12 ENCOUNTER — Ambulatory Visit: Payer: Self-pay | Admitting: Neurosurgery

## 2024-06-12 DIAGNOSIS — T85192S Other mechanical complication of implanted electronic neurostimulator (electrode) of spinal cord, sequela: Secondary | ICD-10-CM | POA: Insufficient documentation

## 2024-06-12 DIAGNOSIS — Z9689 Presence of other specified functional implants: Secondary | ICD-10-CM

## 2024-06-12 MED ORDER — LIDOCAINE 5 % EX PTCH: 1.0000 | MEDICATED_PATCH | CUTANEOUS | 1 refills | Status: AC

## 2024-06-13 ENCOUNTER — Other Ambulatory Visit: Payer: Self-pay

## 2024-06-13 ENCOUNTER — Telehealth: Payer: Self-pay

## 2024-06-13 DIAGNOSIS — Z9689 Presence of other specified functional implants: Secondary | ICD-10-CM

## 2024-06-13 DIAGNOSIS — T85192S Other mechanical complication of implanted electronic neurostimulator (electrode) of spinal cord, sequela: Secondary | ICD-10-CM

## 2024-06-13 DIAGNOSIS — Z01818 Encounter for other preprocedural examination: Secondary | ICD-10-CM

## 2024-06-13 NOTE — Telephone Encounter (Signed)
-----   Message from Penne LELON Sharps sent at 06/13/2024  7:05 AM EDT ----- Regarding: RE: Left-sided IPG site revision, orders in Same battery ----- Message ----- From: Cy Leech, RN Sent: 06/12/2024   5:57 PM EDT To: Penne LELON Sharps, MD; [facility] R[facility] Subject: FW: Left-sided IPG site revision, orders in    Just want to confirm you will use the battery she already has and just move it to a new location? Just want to make sure we dont need to request auth for a new battery (code [id number]) with the revision code. ----- Message ----- From: Sharps Penne LELON, MD Sent: 06/12/2024   4:54 PM EDT To: [facility] Subject: Left-sided IPG site revision, orders in

## 2024-06-13 NOTE — Telephone Encounter (Signed)
 Called patient to schedule surgery date and go over surgical instructions. Scheduled post ops. Case posted.    Please see below for information in regards to your upcoming surgery:   Planned surgery: Left sided internal pulse generator site revision   Surgery date: 06/26/2024 at [facility] (Medical Mall: [address], [place], [place] [address]) - you will find out your arrival time the business day before your surgery.    Pre-op appointment at [facility] Pre-admit Testing: you will receive a call with a date/time for this appointment. If you are scheduled for an in person appointment, Pre-admit Testing is located on the first floor of the [facility] building, [address], [address]. During this appointment, they will advise you which medications you can take the morning of surgery, and which medications you will need to hold for surgery. Labs (such as blood work, EKG) may be done at your pre-op appointment. You are not required to fast for these labs. Should you need to change your pre-op appointment, please call Pre-admit testing at [phone].        How to contact us :  If you have any questions/concerns before or after surgery, you can reach us  at [phone], or you can send a mychart message. We can be reached by phone or mychart 8am-4pm, Monday-Friday.  *Please note: Calls after 4pm are forwarded to a third party answering service. Mychart messages are not routinely monitored during evenings, weekends, and holidays. Please call our office to contact the answering service for urgent concerns during non-business hours.      If you have FMLA/disability paperwork, please drop it off or fax it to [phone]   Appointments/FMLA & disability paperwork: Reche Hait, & Nichole Registered Nurse/Surgery scheduler: Kendelyn, RN & Katie, RN Certified Medical Assistants: Don, CMA, Elenor, CMA, Damien, CMA, & Auston,  [place] Physician Assistants: Lyle Decamp, PA-C, Edsel Goods, PA-C & Glade Boys, PA-C Surgeons: Penne Sharps, MD & Reeves Daisy, MD      [facility] PREADMIT TESTING VISIT and SURGERY INFORMATION SHEET   Now that surgery has been scheduled you can anticipate several phone calls from [facility] services. A pharmacy technician will call you to verify your current list of medications taken at home.               The Pre-Service Center will call to verify your insurance information and to give you billing estimates and information.             The Preadmit Testing Office will be calling to schedule a visit to obtain information for the anesthesia team and provide instructions on preparation for surgery.  What can you expect for the Preadmit Testing Visit: Appointments may be scheduled in-person or by telephone.  If a telephone visit is scheduled, you may be asked to come into the office to have lab tests or other studies performed.   This visit will not be completed any greater than 14 days prior to your surgery.  If your surgery has been scheduled for a future date, please do not be alarmed if we have not contacted you to schedule an appointment more than a month prior to the surgery date.    Please be prepared to provide the following information during this appointment:            -Personal medical history                                               -  Medication and allergy list            -Any history of problems with anesthesia              -Recent lab work or diagnostic studies            -Please notify us  of any needs we should be aware of to provide the best care possible           -You will be provided with instructions on how to prepare for your surgery.    On The Day of Surgery:  You must have a driver to take you home after surgery, you will be asked not to drive for 24 hours following surgery.  Taxi, Gisele and non-medical transport will not be  acceptable means of transportation unless you have a responsible individual who will be traveling with you.  Visitors in the surgical area:   2 people will be able to visit you in your room once your preparation for surgery has been completed. During surgery, your visitors will be asked to wait in the Surgery Waiting Area.  It is not a requirement for them to stay, if they prefer to leave and come back.  Your visitor(s) will be given an update once the surgery has been completed.  No visitors are allowed in the initial recovery room to respect patient privacy and safety.  Once you are more awake and transfer to the secondary recovery area, or are transferred to an inpatient room, visitors will again be able to see you.  To respect and protect your privacy: We will ask on the day of surgery who your driver will be and what the contact number for that individual will be. We will ask if it is okay to share information with this individual, or if there is an alternative individual that we, or the surgeon, should contact to provide updates and information. If family or friends come to the surgical information desk requesting information about you, who you have not listed with us , no information will be given.   It may be helpful to designate someone as the main contact who will be responsible for updating your other friends and family.    PREADMIT TESTING OFFICE: [phone] SAME DAY SURGERY: [phone] We look forward to caring for you before and throughout the process of your surgery.

## 2024-06-17 ENCOUNTER — Ambulatory Visit

## 2024-06-18 ENCOUNTER — Ambulatory Visit: Admitting: Surgery

## 2024-06-18 ENCOUNTER — Ambulatory Visit

## 2024-06-20 ENCOUNTER — Encounter: Payer: Self-pay | Attending: Physical Medicine and Rehabilitation | Admitting: Physical Medicine and Rehabilitation

## 2024-06-20 ENCOUNTER — Encounter: Payer: Self-pay | Admitting: Physical Medicine and Rehabilitation

## 2024-06-20 VITALS — BP 117/81 | HR 96 | Ht 64.0 in | Wt 265.0 lb

## 2024-06-20 DIAGNOSIS — R269 Unspecified abnormalities of gait and mobility: Secondary | ICD-10-CM | POA: Diagnosis present

## 2024-06-20 DIAGNOSIS — G8222 Paraplegia, incomplete: Secondary | ICD-10-CM | POA: Insufficient documentation

## 2024-06-20 DIAGNOSIS — G8929 Other chronic pain: Secondary | ICD-10-CM | POA: Insufficient documentation

## 2024-06-20 DIAGNOSIS — G43019 Migraine without aura, intractable, without status migrainosus: Secondary | ICD-10-CM | POA: Diagnosis present

## 2024-06-20 DIAGNOSIS — M5442 Lumbago with sciatica, left side: Secondary | ICD-10-CM | POA: Diagnosis not present

## 2024-06-20 DIAGNOSIS — Z9689 Presence of other specified functional implants: Secondary | ICD-10-CM | POA: Insufficient documentation

## 2024-06-20 DIAGNOSIS — M5441 Lumbago with sciatica, right side: Secondary | ICD-10-CM | POA: Diagnosis present

## 2024-06-20 MED ORDER — TOPIRAMATE 100 MG PO TABS: 100.0000 mg | ORAL_TABLET | Freq: Two times a day (BID) | ORAL | 1 refills | Status: AC

## 2024-06-20 NOTE — Patient Instructions (Signed)
 Pt is a 53 yr old female with paraparesis/incomplete paraplegia due to attempted spinal cord stimulator placement- With chronic hx of orthostasis, BMI of 41 - back dwon from 44-  s/p gastric sleeve, Acute on chronic renal issues- CKD stage II,  here for f/u on SCI and neurogenic orthostatic hypotension. Vestibular issues due to trigger points and central issues.  Also had surgery on back in December 2023- L2-L3 laminectomy- at [facility]- Dr Royden Schneider- Spine and Scoliosis- in [place], is Ortho spine per pt.  Here for f/u on ASIA D incomplete paraplegia and chronic pain.   Here for trigger point injections as well.    Needs surgery for spinal cord stimulator to be replaced in correct place-    2.  Will increase topamax -  to 100 mg 2x/day- for headache prevention- but if side effects, do 200 mg at bedtime- for headaches.    3. Get theracane- hold pressure 2-4 minutes on each trigger point get from [facility]- can get one that breaks down so can use on trips- side of neck- scalenes, on corner of neck and shoulder-  levators and on inner shoulder, upper trapezius.  Will have more fluttering DURING the pressure, but will help.  The firmer the pressure you cna tolerate- can start with less and then work up to firmer pressure during the 2+ minutes   4. Will get back for trigger point injections- on wait list!   5. F/U in 3months- on SCI and trp injections as wlel

## 2024-06-20 NOTE — Progress Notes (Signed)
 Subjective:    Patient ID: Vanessa Medina, female    DOB: 1971-07-23, 53 y.o.   MRN: [id number]  HPI Pt is a 53 yr old female with paraparesis/incomplete paraplegia due to attempted spinal cord stimulator placement- With chronic hx of orthostasis, BMI of 41 - back dwon from 44-  s/p gastric sleeve, Acute on chronic renal issues- CKD stage II,  here for f/u on SCI and neurogenic orthostatic hypotension. Vestibular issues due to trigger points and central issues.  Also had surgery on back in December 2023- L2-L3 laminectomy- at [facility]- Dr Royden Schneider- Spine and Scoliosis- in [place], is Ortho spine per pt.  Here for f/u on ASIA D incomplete paraplegia and chronic pain.   Having awful HA's.  Might have to do with new thyroid  meds Ever since started Synthroid 25 mcg.    Frontal HA's on R- constant- and ears are back to problems has before- fluttering in ears.  HA spikes multiple times a day- HA always there, but up and moving, throbbing.  Hasn't progressed into migraines'- just a really bad HA.   Hasn't done/used theracane.    Has been on meds for 2 months- TSH got as low as 0.62-  B12 and getting shots for that.  Was 218- (normal >300)  Hb is 11.3-  Had hysterectomy-  1997.  always low  No current treatment for cancer- put back a cancer medicine- took off for 3months- due to losing hair- was losing gobs of hair- but not cancer med related.  Letrozole .   Still taking Topamax  100 mg at bedtime.   Has ot have surgery next week since battery pack came out of pocket- sticking out.  So has surgery for stimulator again.    Pain Inventory Average Pain 8 Pain Right Now 8 My pain is constant, sharp, and aching  In the last 24 hours, has pain interfered with the following? General activity 10 Relation with others 10 Enjoyment of life 10 What TIME of day is your pain at its worst? morning , daytime, evening, and night Sleep (in general) Poor  Pain is worse with: walking,  sitting, standing, and some activites Pain improves with: rest, heat/ice, and medication Relief from Meds: 5  Family History  Problem Relation Age of Onset   Renal Disease Mother    Hypertension Mother    Sudden Cardiac Death Mother    Heart failure Mother    Valvular heart disease Mother    Heart disease Mother    Heart disease Father        Just had stents placed   Stroke Brother    Heart attack Brother 30   Breast cancer Maternal Grandmother    Diabetes Other    Social History   Socioeconomic History   Marital status: Divorced    Spouse name: Not on file   Number of children: 1   Years of education: Not on file   Highest education level: Not on file  Occupational History   Not on file  Tobacco Use   Smoking status: Never    Passive exposure: Never   Smokeless tobacco: Never  Vaping Use   Vaping status: Never Used  Substance and Sexual Activity   Alcohol use: Yes    Comment: occasional wine   Drug use: No   Sexual activity: Not Currently    Birth control/protection: Surgical    Comment: Hyst  Other Topics Concern   Not on file  Social History Narrative   Right handed  Uses cane to walk    Son has Medical and legal POA.   Social Drivers of Corporate investment banker Strain: Low Risk  (10/31/2023)   Received from [facility]   Overall Financial Resource Strain (CARDIA)    Difficulty of Paying Living Expenses: Not very hard  Food Insecurity: No Food Insecurity (10/31/2023)   Received from [facility]   Hunger Vital Sign    Within the past 12 months, you worried that your food would run out before you got the money to buy more.: Never true    Within the past 12 months, the food you bought just didn't last and you didn't have money to get more.: Never true  Transportation Needs: No Transportation Needs (10/31/2023)   Received from [facility] - Transportation    In the past 12 months, has lack  of transportation kept you from medical appointments or from getting medications?: No    Lack of Transportation (Non-Medical): No  Physical Activity: Inactive (12/11/2022)   Exercise Vital Sign    Days of Exercise per Week: 0 days    Minutes of Exercise per Session: 0 min  Stress: Stress Concern Present (12/11/2022)   [employer] of Occupational Health - Occupational Stress Questionnaire    Feeling of Stress : To some extent  Social Connections: Moderately Isolated (12/11/2022)   Social Connection and Isolation Panel    Frequency of Communication with Friends and Family: Three times a week    Frequency of Social Gatherings with Friends and Family: Twice a week    Attends Religious Services: 1 to 4 times per year    Active Member of [employer] or Organizations: No    Attends Engineer, structural: Never    Marital Status: Divorced   Past Surgical History:  Procedure Laterality Date   ABDOMINAL HYSTERECTOMY     APPENDECTOMY     BACK SURGERY     BILIOPANCREATIC DIVISION W DUODENAL SWITCH N/A    BREAST BIOPSY Left 12/06/2022   u/s bx,1:00 heart clip, path pending   BREAST BIOPSY Left 12/06/2022   us  bx 2:00 ribbon clip path pending   BREAST BIOPSY Left 12/06/2022   US  LT BREAST BX W LOC DEV EA ADD LESION IMG BX SPEC US  GUIDE 12/06/2022 [facility]   BREAST BIOPSY Left 12/06/2022   US  LT BREAST BX W LOC DEV 1ST LESION IMG BX SPEC US  GUIDE 12/06/2022 [facility]   BREAST RECONSTRUCTION WITH PLACEMENT OF TISSUE EXPANDER AND FLEX HD (ACELLULAR HYDRATED DERMIS) Bilateral 01/18/2023   Procedure: IMMEDIATE LEFT BREAST RECONSTRUCTION WITH PLACEMENT OF TISSUE EXPANDER AND FLEX HD (ACELLULAR HYDRATED DERMIS);  Surgeon: [name], DO;  Location: [facility];  Service: Plastics;  Laterality: Bilateral;   BREAST RECONSTRUCTION WITH PLACEMENT OF TISSUE EXPANDER AND FLEX HD (ACELLULAR HYDRATED DERMIS) Left 03/26/2023   Procedure: BREAST RECONSTRUCTION WITH PLACEMENT OF TISSUE  EXPANDER;  Surgeon: [name], DO;  Location: [facility];  Service: Plastics;  Laterality: Left;   CHOLECYSTECTOMY     COLONOSCOPY N/A 06/27/2021   Procedure: COLONOSCOPY;  Surgeon: Onita Elspeth Sharper, DO;  Location: [facility];  Service: Gastroenterology;  Laterality: N/A;   COLONOSCOPY N/A 08/27/2023   Procedure: COLONOSCOPY;  Surgeon: Onita Elspeth Sharper, DO;  Location: [facility] ENDOSCOPY;  Service: Gastroenterology;  Laterality: N/A;   DIAGNOSTIC LAPAROSCOPY     LOA   ESOPHAGOGASTRODUODENOSCOPY N/A 06/27/2021   Procedure: ESOPHAGOGASTRODUODENOSCOPY (EGD);  Surgeon: Onita Elspeth Sharper,  DO;  Location: [facility];  Service: Gastroenterology;  Laterality: N/A;   ESOPHAGOGASTRODUODENOSCOPY (EGD) WITH PROPOFOL  N/A 07/25/2017   Procedure: ESOPHAGOGASTRODUODENOSCOPY (EGD) WITH PROPOFOL ;  Surgeon: Viktoria Lamar DASEN, MD;  Location: [facility]COPY;  Service: Endoscopy;  Laterality: N/A;   HERNIA REPAIR     KNEE ARTHROSCOPY Right    LAPAROSCOPIC GASTRIC SLEEVE RESECTION WITH HIATAL HERNIA REPAIR     LIPOSUCTION WITH LIPOFILLING Bilateral 05/21/2024   Procedure: LIPOSUCTION, WITH FAT TRANSFER;  Surgeon: [name], DO;  Location: [facility];  Service: Plastics;  Laterality: Bilateral;  Bilateral breast fat grafting   LUMBAR FUSION  08/29/2022   Revision Lumbar two through five Laminectomy & Fusion with Transforaminal Lumbar interbody fusion   MASTECTOMY W/ SENTINEL NODE BIOPSY Bilateral 01/18/2023   Procedure: MASTECTOMY WITH SENTINEL LYMPH NODE BIOPSY, RNFA to assist;  Surgeon: Jordis Laneta FALCON, MD;  Location: [facility];  Service: General;  Laterality: Bilateral;   PULSE GENERATOR IMPLANT N/A 01/24/2024   Procedure: UNILATERAL PULSE GENERATOR IMPLANT;  Surgeon: [name] ORN, MD;  Location: [facility];  Service: Neurosurgery;  Laterality: N/A;  INTERNAL PULSE GENERATOR PLACEMENT FOR SPINAL CORD STIMULATOR   REMOVAL OF TISSUE EXPANDER AND  PLACEMENT OF IMPLANT Bilateral 06/06/2023   Procedure: REMOVAL OF TISSUE EXPANDER AND PLACEMENT OF IMPLANT;  Surgeon: [name], DO;  Location: [facility];  Service: Plastics;  Laterality: Bilateral;   SPINAL CORD STIMULATOR IMPLANT     THORACIC LAMINECTOMY FOR SPINAL CORD STIMULATOR N/A 01/15/2024   Procedure: THORACIC LAMINECTOMY FOR SPINAL CORD STIMULATOR;  Surgeon: [name] ORN, MD;  Location: [facility];  Service: Neurosurgery;  Laterality: N/A;  THORACIC LAMINECTOMY FOR SPINAL CORD STIMULATOR PADDLE TRIAL   TONSILLECTOMY     TOTAL HIP ARTHROPLASTY Left 01/26/2016   Procedure: LEFT TOTAL HIP ARTHROPLASTY ANTERIOR APPROACH;  Surgeon: Kay CHRISTELLA Cummins, MD;  [facility];  Service: Orthopedics;  Laterality: Left;   TRANSFORAMINAL LUMBAR INTERBODY FUSION (TLIF) WITH PEDICLE SCREW FIXATION 3 LEVEL  08/2019   Lynwood Better, MD L2-L5   Past Surgical History:  Procedure Laterality Date   ABDOMINAL HYSTERECTOMY     APPENDECTOMY     BACK SURGERY     BILIOPANCREATIC DIVISION W DUODENAL SWITCH N/A    BREAST BIOPSY Left 12/06/2022   u/s bx,1:00 heart clip, path pending   BREAST BIOPSY Left 12/06/2022   us  bx 2:00 ribbon clip path pending   BREAST BIOPSY Left 12/06/2022   US  LT BREAST BX W LOC DEV EA ADD LESION IMG BX SPEC US  GUIDE 12/06/2022 [facility]   BREAST BIOPSY Left 12/06/2022   US  LT BREAST BX W LOC DEV 1ST LESION IMG BX SPEC US  GUIDE 12/06/2022 [facility]   BREAST RECONSTRUCTION WITH PLACEMENT OF TISSUE EXPANDER AND FLEX HD (ACELLULAR HYDRATED DERMIS) Bilateral 01/18/2023   Procedure: IMMEDIATE LEFT BREAST RECONSTRUCTION WITH PLACEMENT OF TISSUE EXPANDER AND FLEX HD (ACELLULAR HYDRATED DERMIS);  Surgeon: [name], DO;  Location: [facility];  Service: Plastics;  Laterality: Bilateral;   BREAST RECONSTRUCTION WITH PLACEMENT OF TISSUE EXPANDER AND FLEX HD (ACELLULAR HYDRATED DERMIS) Left 03/26/2023   Procedure: BREAST RECONSTRUCTION WITH PLACEMENT  OF TISSUE EXPANDER;  Surgeon: [name], DO;  Location: [facility];  Service: Plastics;  Laterality: Left;   CHOLECYSTECTOMY     COLONOSCOPY N/A 06/27/2021   Procedure: COLONOSCOPY;  Surgeon: Onita Elspeth Sharper, DO;  Location: [facility];  Service: Gastroenterology;  Laterality: N/A;   COLONOSCOPY N/A 08/27/2023   Procedure:  COLONOSCOPY;  Surgeon: Onita Elspeth Sharper, DO;  Location: [facility];  Service: Gastroenterology;  Laterality: N/A;   DIAGNOSTIC LAPAROSCOPY     LOA   ESOPHAGOGASTRODUODENOSCOPY N/A 06/27/2021   Procedure: ESOPHAGOGASTRODUODENOSCOPY (EGD);  Surgeon: Onita Elspeth Sharper, DO;  Location: [facility];  Service: Gastroenterology;  Laterality: N/A;   ESOPHAGOGASTRODUODENOSCOPY (EGD) WITH PROPOFOL  N/A 07/25/2017   Procedure: ESOPHAGOGASTRODUODENOSCOPY (EGD) WITH PROPOFOL ;  Surgeon: Viktoria Lamar DASEN, MD;  Location: [facility];  Service: Endoscopy;  Laterality: N/A;   HERNIA REPAIR     KNEE ARTHROSCOPY Right    LAPAROSCOPIC GASTRIC SLEEVE RESECTION WITH HIATAL HERNIA REPAIR     LIPOSUCTION WITH LIPOFILLING Bilateral 05/21/2024   Procedure: LIPOSUCTION, WITH FAT TRANSFER;  Surgeon: [name], DO;  Location: [facility];  Service: Plastics;  Laterality: Bilateral;  Bilateral breast fat grafting   LUMBAR FUSION  08/29/2022   Revision Lumbar two through five Laminectomy & Fusion with Transforaminal Lumbar interbody fusion   MASTECTOMY W/ SENTINEL NODE BIOPSY Bilateral 01/18/2023   Procedure: MASTECTOMY WITH SENTINEL LYMPH NODE BIOPSY, RNFA to assist;  Surgeon: Jordis Laneta FALCON, MD;  Location: [facility];  Service: General;  Laterality: Bilateral;   PULSE GENERATOR IMPLANT N/A 01/24/2024   Procedure: UNILATERAL PULSE GENERATOR IMPLANT;  Surgeon: [name] ORN, MD;  Location: [facility];  Service: Neurosurgery;  Laterality: N/A;  INTERNAL PULSE GENERATOR PLACEMENT FOR SPINAL CORD STIMULATOR   REMOVAL OF TISSUE EXPANDER  AND PLACEMENT OF IMPLANT Bilateral 06/06/2023   Procedure: REMOVAL OF TISSUE EXPANDER AND PLACEMENT OF IMPLANT;  Surgeon: [name], DO;  Location: [facility];  Service: Plastics;  Laterality: Bilateral;   SPINAL CORD STIMULATOR IMPLANT     THORACIC LAMINECTOMY FOR SPINAL CORD STIMULATOR N/A 01/15/2024   Procedure: THORACIC LAMINECTOMY FOR SPINAL CORD STIMULATOR;  Surgeon: [name] ORN, MD;  Location: [facility];  Service: Neurosurgery;  Laterality: N/A;  THORACIC LAMINECTOMY FOR SPINAL CORD STIMULATOR PADDLE TRIAL   TONSILLECTOMY     TOTAL HIP ARTHROPLASTY Left 01/26/2016   Procedure: LEFT TOTAL HIP ARTHROPLASTY ANTERIOR APPROACH;  Surgeon: Kay CHRISTELLA Cummins, MD;  [facility];  Service: Orthopedics;  Laterality: Left;   TRANSFORAMINAL LUMBAR INTERBODY FUSION (TLIF) WITH PEDICLE SCREW FIXATION 3 LEVEL  08/2019   Lynwood Better, MD L2-L5   Past Medical History:  Diagnosis Date   Acute nontraumatic kidney injury    Anemia    Anxiety    Asthma    Benign cyst of kidney    Breast cancer (HCC) 01/18/2023   left breast-bil mastectomies   Cervical cancer (HCC)    Chronic back pain    Chronic hypotension    Chronic kidney disease    Previous now clear   Claustrophobia    Decompression injury of spinal cord    Depression    Dizziness    Failed spinal cord stimulator    Family history of adverse reaction to anesthesia      my mother takes a long time time wake up   History of dysplastic nevus 01/18/2018   right shoulder, recurrent dysplastic nevus   History of kidney stones    History of shingles    Hypercholesteremia    Hypothyroidism    Inappropriate sinus node tachycardia    Migraine    Morbid obesity (HCC)    Multiple allergies    Neurogenic orthostatic hypotension (HCC)    Obesity    Osteoarthritis    left hip   Pneumonia    PONV (postoperative  nausea and vomiting)    delayed emergence   S/P insertion of spinal cord stimulator 01/24/2024   Seizure  (HCC) 2021   x1-after a back surgery with spinal cord stimulator   Slow to wake up after anesthesia    Stroke (HCC) 08/11/2020   spinal cord stroke, spinal cord puncture during surgery   Wears glasses    BP 117/81   Pulse 96   Ht 5' 4 (1.626 m)   Wt 265 lb (120.2 kg)   SpO2 95%   BMI 45.49 kg/m   Opioid Risk Score:   Fall Risk Score:  `1  Depression screen [facility] 2/9     03/06/2024   10:14 AM 08/22/2023    9:03 AM 05/21/2023   11:08 AM 12/08/2022   11:28 AM 11/13/2022    9:46 AM 11/13/2022    9:42 AM 02/22/2022    9:48 AM  Depression screen PHQ 2/9  Decreased Interest 0 0 0 2 0 0 0  Down, Depressed, Hopeless 0 0 0 1 0 0 0  PHQ - 2 Score 0 0 0 3 0 0 0     Review of Systems  Musculoskeletal:  Positive for back pain.       B/L knee pain Pain in the back of both legs  All other systems reviewed and are negative.      Objective:   Physical Exam  Awake, alert, appropriate, depressed affect, NAD So tight in neck-  R>>L scalenes, levators and upper traps.       Assessment & Plan:   Pt is a 53 yr old female with paraparesis/incomplete paraplegia due to attempted spinal cord stimulator placement- With chronic hx of orthostasis, BMI of 41 - back dwon from 44-  s/p gastric sleeve, Acute on chronic renal issues- CKD stage II,  here for f/u on SCI and neurogenic orthostatic hypotension. Vestibular issues due to trigger points and central issues.  Also had surgery on back in December 2023- L2-L3 laminectomy- at [facility]- Dr Royden Schneider- Spine and Scoliosis- in [place], is Ortho spine per pt.  Here for f/u on ASIA D incomplete paraplegia and chronic pain.   Here for trigger point injections as well.    Needs surgery for spinal cord stimulator to be replaced in correct place-    2.  Will increase topamax -  to 100 mg 2x/day- for headache prevention- but if side effects, do 200 mg at bedtime- for headaches.    3. Get theracane- hold pressure 2-4 minutes on each trigger point get  from [facility]- can get one that breaks down so can use on trips- side of neck- scalenes, on corner of neck and shoulder-  levators and on inner shoulder, upper trapezius.  Will have more fluttering DURING the pressure, but will help.  The firmer the pressure you cna tolerate- can start with less and then work up to firmer pressure during the 2+ minutes   4. Will get back for trigger point injections- on wait list! Pt has failed non trigger point work for severe neck and shoulder pain as well as unrelenting migraines- NEEDS trigger points- has worked well to keep them under control.      5. F/U in 3 months- on SCI and trp injections as wlel    I spent a total of    minutes on total care today- >50% coordination of care- due to

## 2024-06-23 ENCOUNTER — Encounter
Admission: RE | Admit: 2024-06-23 | Discharge: 2024-06-23 | Disposition: A | Discharge status: Home / Self Care | Source: Ambulatory Visit | Attending: Acute Care | Admitting: Acute Care

## 2024-06-23 ENCOUNTER — Other Ambulatory Visit: Payer: Self-pay

## 2024-06-23 HISTORY — DX: Chronic kidney disease, stage 2 (mild): N18.2

## 2024-06-23 HISTORY — DX: Other complications of anesthesia, initial encounter: T88.59XA

## 2024-06-23 NOTE — Pre-Procedure Instructions (Signed)
 Called patient earlier than scheduled for pre op interview. No answer, left VM.

## 2024-06-23 NOTE — Patient Instructions (Addendum)
 Your procedure is scheduled on: Thursday 06/26/24 Report to the Registration Desk on the 1st floor of the [facility]. To find out your arrival time, please call [phone] between 1PM - 3PM on: Wednesday 06/25/24 If your arrival time is 6:00 am, do not arrive before that time as the [facility] entrance doors do not open until 6:00 am.  REMEMBER: Instructions that are not followed completely may result in serious medical risk, up to and including death; or upon the discretion of your surgeon and anesthesiologist your surgery may need to be rescheduled.  Do not eat food after midnight the night before surgery.  No gum chewing or hard candies.  You may however, drink CLEAR liquids up to 2 hours before you are scheduled to arrive for your surgery. Do not drink anything within 2 hours of your scheduled arrival time.  Clear liquids include: - water   - apple juice without pulp - gatorade (not RED colors) - black coffee or tea (Do NOT add milk or creamers to the coffee or tea) Do NOT drink anything that is not on this list.  One week prior to surgery: Stop Anti-inflammatories (NSAIDS) such as Advil, Aleve, Ibuprofen, Motrin, Naproxen, Naprosyn and Aspirin  based products such as Excedrin, Goody's Powder, BC Powder.  You may however, continue to take Tylenol  if needed for pain up until the day of surgery.  Stop ANY OVER THE COUNTER supplements and vitamins until after surgery.  Continue taking all of your other prescription medications up until the day of surgery.  ON THE DAY OF SURGERY ONLY TAKE THESE MEDICATIONS WITH SIPS OF WATER :  topiramate  (TOPAMAX ) 100 MG tablet  levothyroxine (SYNTHROID) 25 MCG tablet  letrozole  (FEMARA ) 2.5 MG tablet    Use inhalers on the day of surgery and bring to the hospital.  No Alcohol for 24 hours before or after surgery.  No Smoking including e-cigarettes for 24 hours before surgery.  No chewable tobacco products for at least 6 hours before  surgery.  No nicotine patches on the day of surgery.  Do not use any recreational drugs for at least a week (preferably 2 weeks) before your surgery.  Please be advised that the combination of cocaine and anesthesia may have negative outcomes, up to and including death. If you test positive for cocaine, your surgery will be cancelled.  On the morning of surgery brush your teeth with toothpaste and water , you may rinse your mouth with mouthwash if you wish. Do not swallow any toothpaste or mouthwash.  Use CHG Soap or wipes as directed on instruction sheet.  Do not wear lotions, powders, or perfumes  Do not shave body hair from the neck down 48 hours before surgery.  Wear comfortable clothing (specific to your surgery type) to the hospital.  Do not wear jewelry, make-up, hairpins, clips or nail polish.  For welded (permanent) jewelry: bracelets, anklets, waist bands, etc.  Please have this removed prior to surgery.  If it is not removed, there is a chance that hospital personnel will need to cut it off on the day of surgery.  Contact lenses, hearing aids and dentures may not be worn into surgery.  Do not bring valuables to the hospital. [facility] is not responsible for any missing/lost belongings or valuables.   Notify your doctor if there is any change in your medical condition (cold, fever, infection).  After surgery, you can help prevent lung complications by doing breathing exercises.  Take deep breaths and cough every 1-2 hours.  Your doctor may order a device called an Incentive Spirometer to help you take deep breaths.  If you are being discharged the day of surgery, you will not be allowed to drive home. You will need a responsible individual to drive you home and stay with you for 24 hours after surgery.   Please call the Pre-admissions Testing Dept. at [phone] if you have any questions about these instructions.  Surgery Visitation Policy:  Patients having  surgery or a procedure may have two visitors.  Children under the age of 37 must have an adult with them who is not the patient.   Merchandiser, retail to address health-related social needs:  [email or web address]                                                                                                             Preparing for Surgery with CHLORHEXIDINE  GLUCONATE (CHG) Soap  Chlorhexidine  Gluconate (CHG) Soap  o An antiseptic cleaner that kills germs and bonds with the skin to continue killing germs even after washing  o Used for showering the night before surgery and morning of surgery  Before surgery, you can play an important role by reducing the number of germs on your skin.  CHG (Chlorhexidine  gluconate) soap is an antiseptic cleanser which kills germs and bonds with the skin to continue killing germs even after washing.  Please do not use if you have an allergy to CHG or antibacterial soaps. If your skin becomes reddened/irritated stop using the CHG.  1. Shower the NIGHT BEFORE SURGERY with CHG soap.  2. If you choose to wash your hair, wash your hair first as usual with your normal shampoo.  3. After shampooing, rinse your hair and body thoroughly to remove the shampoo.  4. Use CHG as you would any other liquid soap. You can apply CHG directly to the skin and wash gently with a clean washcloth.  5. Apply the CHG soap to your body only from the neck down. Do not use on open wounds or open sores. Avoid contact with your eyes, ears, mouth, and genitals (private parts). Wash face and genitals (private parts) with your normal soap.  6. Wash thoroughly, paying special attention to the area where your surgery will be performed.  7. Thoroughly rinse your body with warm water .  8. Do not shower/wash with your normal soap after using and rinsing off the CHG soap.  9. Do not use lotions, oils, etc., after showering with CHG.  10. Pat yourself dry with a  clean towel.  11. Wear clean pajamas to bed the night before surgery.  12. Place clean sheets on your bed the night of your shower and do not sleep with pets.  13. Do not apply any deodorants/lotions/powders.  14. Please wear clean clothes to the hospital.  15. Remember to brush your teeth with your regular toothpaste.

## 2024-06-23 NOTE — Progress Notes (Signed)
 Right sided leg weakness uses cane fall risk

## 2024-06-24 ENCOUNTER — Ambulatory Visit: Admitting: Plastic Surgery

## 2024-06-24 ENCOUNTER — Ambulatory Visit

## 2024-06-26 ENCOUNTER — Ambulatory Visit: Payer: Self-pay | Admitting: Anesthesiology

## 2024-06-26 ENCOUNTER — Ambulatory Visit: Admitting: Physical Therapy

## 2024-06-26 ENCOUNTER — Ambulatory Visit

## 2024-06-26 ENCOUNTER — Ambulatory Visit: Payer: Self-pay | Admitting: Urgent Care

## 2024-06-26 ENCOUNTER — Encounter
Admission: RE | Disposition: A | Discharge status: Home / Self Care | Payer: Self-pay | Source: Home / Self Care | Attending: Neurosurgery

## 2024-06-26 ENCOUNTER — Ambulatory Visit
Admission: RE | Admit: 2024-06-26 | Discharge: 2024-06-26 | Disposition: A | Discharge status: Home / Self Care | Attending: Neurosurgery | Admitting: Neurosurgery

## 2024-06-26 ENCOUNTER — Other Ambulatory Visit: Payer: Self-pay

## 2024-06-26 ENCOUNTER — Encounter: Payer: Self-pay | Admitting: Neurosurgery

## 2024-06-26 DIAGNOSIS — M96840 Postprocedural hematoma of a musculoskeletal structure following a musculoskeletal system procedure: Secondary | ICD-10-CM | POA: Insufficient documentation

## 2024-06-26 DIAGNOSIS — R296 Repeated falls: Secondary | ICD-10-CM

## 2024-06-26 DIAGNOSIS — Z9689 Presence of other specified functional implants: Secondary | ICD-10-CM

## 2024-06-26 DIAGNOSIS — T85193A Other mechanical complication of implanted electronic neurostimulator, generator, initial encounter: Secondary | ICD-10-CM | POA: Diagnosis not present

## 2024-06-26 DIAGNOSIS — T85890A Other specified complication of nervous system prosthetic devices, implants and grafts, initial encounter: Secondary | ICD-10-CM | POA: Diagnosis not present

## 2024-06-26 DIAGNOSIS — Z9181 History of falling: Secondary | ICD-10-CM | POA: Insufficient documentation

## 2024-06-26 DIAGNOSIS — T85192S Other mechanical complication of implanted electronic neurostimulator (electrode) of spinal cord, sequela: Secondary | ICD-10-CM

## 2024-06-26 DIAGNOSIS — G894 Chronic pain syndrome: Secondary | ICD-10-CM | POA: Insufficient documentation

## 2024-06-26 DIAGNOSIS — W19XXXA Unspecified fall, initial encounter: Secondary | ICD-10-CM | POA: Insufficient documentation

## 2024-06-26 DIAGNOSIS — Z01818 Encounter for other preprocedural examination: Secondary | ICD-10-CM

## 2024-06-26 HISTORY — PX: LUMBAR SPINAL CORD STIMULATOR REVISION: SHX6811

## 2024-06-26 HISTORY — PX: LUMBAR SPINAL CORD SIMULATOR REVISION: SHX6811

## 2024-06-26 SURGERY — LUMBAR SPINAL CORD STIMULATOR REVISION
Anesthesia: General | Laterality: Left

## 2024-06-26 MED ORDER — FENTANYL CITRATE (PF) 100 MCG/2ML IJ SOLN
25.0000 ug | INTRAMUSCULAR | Status: DC | PRN
  Administered 2024-06-26 (×2): 25 ug via INTRAVENOUS
  Administered 2024-06-26: 50 ug via INTRAVENOUS

## 2024-06-26 MED ORDER — CEFAZOLIN SODIUM-DEXTROSE 3-4 GM/150ML-% IV SOLN
3.0000 g | INTRAVENOUS | Status: AC
  Administered 2024-06-26: 3 g via INTRAVENOUS
  Filled 2024-06-26: qty 150

## 2024-06-26 MED ORDER — HYDROCODONE-ACETAMINOPHEN 5-325 MG PO TABS: 1.0000 | ORAL_TABLET | Freq: Four times a day (QID) | ORAL | 0 refills | Status: AC | PRN

## 2024-06-26 MED ORDER — IRRISEPT - 450ML BOTTLE WITH 0.05% CHG IN STERILE WATER, USP 99.95% OPTIME
TOPICAL | Status: DC | PRN
  Administered 2024-06-26: 450 mL

## 2024-06-26 MED ORDER — VANCOMYCIN HCL IN DEXTROSE 1-5 GM/200ML-% IV SOLN
INTRAVENOUS | Status: AC
Start: 2024-06-26 — End: 2024-06-26
  Filled 2024-06-26: qty 200

## 2024-06-26 MED ORDER — PROPOFOL 10 MG/ML IV BOLUS
INTRAVENOUS | Status: DC | PRN
  Administered 2024-06-26: 140 mg via INTRAVENOUS

## 2024-06-26 MED ORDER — ACETAMINOPHEN 500 MG PO TABS: 500.0000 mg | ORAL_TABLET | Freq: Four times a day (QID) | ORAL | 0 refills | Status: DC | PRN

## 2024-06-26 MED ORDER — FENTANYL CITRATE (PF) 100 MCG/2ML IJ SOLN
INTRAMUSCULAR | Status: AC
  Filled 2024-06-26: qty 2

## 2024-06-26 MED ORDER — FENTANYL CITRATE (PF) 100 MCG/2ML IJ SOLN
INTRAMUSCULAR | Status: DC | PRN
  Administered 2024-06-26: 50 ug via INTRAVENOUS

## 2024-06-26 MED ORDER — CHLORHEXIDINE GLUCONATE 0.12 % MT SOLN
15.0000 mL | Freq: Once | OROMUCOSAL | Status: AC
  Administered 2024-06-26: 15 mL via OROMUCOSAL

## 2024-06-26 MED ORDER — DEXAMETHASONE SOD PHOSPHATE PF 10 MG/ML IJ SOLN
INTRAMUSCULAR | Status: DC | PRN
  Administered 2024-06-26: 10 mg via INTRAVENOUS

## 2024-06-26 MED ORDER — DROPERIDOL 2.5 MG/ML IJ SOLN
0.6250 mg | Freq: Once | INTRAMUSCULAR | Status: AC
  Administered 2024-06-26: 0.625 mg via INTRAVENOUS

## 2024-06-26 MED ORDER — CEFAZOLIN IN SODIUM CHLORIDE 2-0.9 GM/100ML-% IV SOLN
3.0000 g | Freq: Once | INTRAVENOUS | Status: DC
  Filled 2024-06-26: qty 200

## 2024-06-26 MED ORDER — MIDAZOLAM HCL (PF) 2 MG/2ML IJ SOLN
INTRAMUSCULAR | Status: DC | PRN
  Administered 2024-06-26: 2 mg via INTRAVENOUS

## 2024-06-26 MED ORDER — ROCURONIUM BROMIDE 100 MG/10ML IV SOLN
INTRAVENOUS | Status: DC | PRN
  Administered 2024-06-26: 50 mg via INTRAVENOUS

## 2024-06-26 MED ORDER — PROPOFOL 500 MG/50ML IV EMUL
INTRAVENOUS | Status: DC | PRN
  Administered 2024-06-26: 150 ug/kg/min via INTRAVENOUS

## 2024-06-26 MED ORDER — LIDOCAINE HCL (CARDIAC) PF 100 MG/5ML IV SOSY
PREFILLED_SYRINGE | INTRAVENOUS | Status: DC | PRN
Start: 2024-06-26 — End: 2024-06-26
  Administered 2024-06-26: 60 mg via INTRAVENOUS

## 2024-06-26 MED ORDER — LACTATED RINGERS IV SOLN: INTRAVENOUS | Status: DC

## 2024-06-26 MED ORDER — CHLORHEXIDINE GLUCONATE 0.12 % MT SOLN
OROMUCOSAL | Status: AC
  Filled 2024-06-26: qty 15

## 2024-06-26 MED ORDER — SENNA 8.6 MG PO TABS: 1.0000 | ORAL_TABLET | Freq: Every day | ORAL | 0 refills | Status: DC

## 2024-06-26 MED ORDER — VANCOMYCIN HCL IN DEXTROSE 1-5 GM/200ML-% IV SOLN
1000.0000 mg | Freq: Once | INTRAVENOUS | Status: AC
  Administered 2024-06-26: 1000 mg via INTRAVENOUS

## 2024-06-26 MED ORDER — MIDAZOLAM HCL 2 MG/2ML IJ SOLN
INTRAMUSCULAR | Status: AC
  Filled 2024-06-26: qty 2

## 2024-06-26 MED ORDER — DOCUSATE SODIUM 100 MG PO CAPS: 100.0000 mg | ORAL_CAPSULE | Freq: Two times a day (BID) | ORAL | 0 refills | Status: DC

## 2024-06-26 MED ORDER — ORAL CARE MOUTH RINSE: 15.0000 mL | Freq: Once | OROMUCOSAL | Status: AC

## 2024-06-26 MED ORDER — ACETAMINOPHEN 10 MG/ML IV SOLN
INTRAVENOUS | Status: DC | PRN
  Administered 2024-06-26: 1000 mg via INTRAVENOUS

## 2024-06-26 MED ORDER — PHENYLEPHRINE 80 MCG/ML (10ML) SYRINGE FOR IV PUSH (FOR BLOOD PRESSURE SUPPORT)
PREFILLED_SYRINGE | INTRAVENOUS | Status: DC | PRN
  Administered 2024-06-26 (×2): 80 ug via INTRAVENOUS
  Administered 2024-06-26: 160 ug via INTRAVENOUS

## 2024-06-26 MED ORDER — ONDANSETRON HCL 4 MG/2ML IJ SOLN
INTRAMUSCULAR | Status: DC | PRN
  Administered 2024-06-26: 4 mg via INTRAVENOUS

## 2024-06-26 MED ORDER — ACETAMINOPHEN 10 MG/ML IV SOLN
INTRAVENOUS | Status: AC
  Filled 2024-06-26: qty 100

## 2024-06-26 MED ORDER — SUGAMMADEX SODIUM 200 MG/2ML IV SOLN
INTRAVENOUS | Status: DC | PRN
  Administered 2024-06-26: 300 mg via INTRAVENOUS

## 2024-06-26 MED ORDER — DROPERIDOL 2.5 MG/ML IJ SOLN
INTRAMUSCULAR | Status: AC
  Filled 2024-06-26: qty 2

## 2024-06-26 MED ORDER — BUPIVACAINE HCL 0.5 % IJ SOLN
INTRAMUSCULAR | Status: DC | PRN
  Administered 2024-06-26: 10 mL

## 2024-06-26 MED ORDER — 0.9 % SODIUM CHLORIDE (POUR BTL) OPTIME
TOPICAL | Status: DC | PRN
  Administered 2024-06-26: 500 mL

## 2024-06-26 SURGICAL SUPPLY — 23 items
BRUSH SCRUB EZ 4% CHG (MISCELLANEOUS) ×1 IMPLANT
BUR NEURO DRILL SOFT 3.0X3.8M (BURR) ×1 IMPLANT
DERMABOND ADVANCED .7 DNX12 (GAUZE/BANDAGES/DRESSINGS) ×2 IMPLANT
DRAPE C-ARM XRAY 36X54 (DRAPES) ×1 IMPLANT
DRAPE LAPAROTOMY 100X77 ABD (DRAPES) ×1 IMPLANT
DRSG OPSITE POSTOP 4X6 (GAUZE/BANDAGES/DRESSINGS) IMPLANT
ELECTRODE REM PT RTRN 9FT ADLT (ELECTROSURGICAL) ×1 IMPLANT
GLOVE BIOGEL PI IND STRL 8 (GLOVE) ×1 IMPLANT
GLOVE SRG 8 PF TXTR STRL LF DI (GLOVE) ×1 IMPLANT
GLOVE SURG SYN 7.5 PF PI (GLOVE) ×1 IMPLANT
GOWN SRG XL LVL 3 NONREINFORCE (GOWNS) ×1 IMPLANT
KIT SPINAL PRONEVIEW (KITS) ×1 IMPLANT
LAVAGE JET IRRISEPT WOUND (IRRIGATION / IRRIGATOR) ×1 IMPLANT
MANIFOLD NEPTUNE II (INSTRUMENTS) ×1 IMPLANT
NDL SAFETY ECLIP 18X1.5 (MISCELLANEOUS) ×1 IMPLANT
NS IRRIG 500ML POUR BTL (IV SOLUTION) ×1 IMPLANT
PACK LAMINECTOMY ARMC (PACKS) ×1 IMPLANT
PAD ARMBOARD POSITIONER FOAM (MISCELLANEOUS) ×1 IMPLANT
SUT STRATA 3-0 15 PS-2 (SUTURE) IMPLANT
SUT VIC AB 0 CT1 18XCR BRD 8 (SUTURE) ×2 IMPLANT
SUT VIC AB 2-0 CT1 18 (SUTURE) ×2 IMPLANT
SYR 30ML LL (SYRINGE) ×2 IMPLANT
TRAP FLUID SMOKE EVACUATOR (MISCELLANEOUS) ×1 IMPLANT

## 2024-06-26 NOTE — H&P (View-Only) (Signed)
    HISTORY OF PRESENT ILLNESS: 06/26/2024 [name] is status post thoracic laminectomy for spinal cord stimulator placement.  She is originally having very good coverage in her bilateral lower extremities.  She did not have good coverage of her mid back pain.  She is being currently worked up for orthopedic issues regarding her hip.SABRA   PHYSICAL EXAMINATION:  General: Patient is well developed, well nourished, calm, collected, and in no apparent distress.  NEUROLOGICAL:  General: In no acute distress.  Awake, alert, oriented to person, place, and time. Pupils equal round and reactive to light.   Strength: Some pain limitation especially in the right hip, no major deficits noted  Incision c/d/i, superficial battery  ROS (Neurologic): Negative except as noted above  IMAGING: Awaiting x-rays  ASSESSMENT/PLAN:  Vanessa Medina is here today after spinal cord stimulator placement and thoracic spine with paddle lead.  She was originally doing well with very good coverage in her bilateral lower extremities.  She has had some multiple falls and has never had good coverage of her back pain despite the good leg coverage.  I will plan to get x-rays of her system to make sure that it continues to be in continuity given her multiple previous falls.  I will also follow-up with the stimulator rep as they are going to interrogate to see whether or not there is good connection between the leads and the stimulator.  Xrays showed good integrity, interrogation should good integrity. Plan to go forward with a site revision without side relocation.    Penne MICAEL Sharps, MD/MS Department of Neurosurgery

## 2024-06-26 NOTE — Anesthesia Postprocedure Evaluation (Signed)
 Anesthesia Post Note  Patient: Vanessa Medina  Procedure(s) Performed: Left sided internal pulse generator site revision (Left)  Patient location during evaluation: PACU Anesthesia Type: General Level of consciousness: awake and alert Pain management: pain level controlled Vital Signs Assessment: post-procedure vital signs reviewed and stable Respiratory status: spontaneous breathing, nonlabored ventilation and respiratory function stable Cardiovascular status: blood pressure returned to baseline and stable Postop Assessment: no apparent nausea or vomiting Anesthetic complications: no   No notable events documented.   Last Vitals:  Vitals:   06/26/24 1035 06/26/24 1057  BP:  119/72  Pulse: 61 (!) 56  Resp: 11 15  Temp:  (!) 36.1 C  SpO2: 98% 100%    Last Pain:  Vitals:   06/26/24 1057  TempSrc: Temporal  PainSc: Asleep                 Fairy POUR Cindra Austad

## 2024-06-26 NOTE — Interval H&P Note (Signed)
 History and Physical Interval Note:  06/26/2024 8:17 AM  Vanessa Medina  has presented today for surgery, with the diagnosis of Painful IPG site.  The various methods of treatment have been discussed with the patient and family. After consideration of risks, benefits and other options for treatment, the patient has consented to  Procedure(s): Left sided internal pulse generator site revision (Left) as a surgical intervention.  The patient's history has been reviewed, patient examined, no change in status, stable for surgery.  I have reviewed the patient's chart and labs.  Questions were answered to the patient's satisfaction.    Heart and lungs clear  Penne LELON Sharps

## 2024-06-26 NOTE — Op Note (Signed)
 Indications: the patient is a 52 yo female who was diagnosed chronic pain syndrome she had a successful placement of a spinal cord stimulator.  She is getting good relief.  Unfortunately she had a severe fall landing on her device and has had pain in the device site ever since..   Findings: successful repositioning of a  spinal cord stimulator IPG.    Preoperative Diagnosis: Painful IPG site  Postoperative Diagnosis: Painful IPG site     EBL: Minimal IVF: see anesthesia record Drains: none Disposition: Extubated and Stable to PACU Complications: none   No foley catheter was placed.     Preoperative Note:    Risks of surgery discussed in clinic.   Operative Note:      The patient was then brought from the preoperative center with intravenous access established.  The patient underwent general anesthesia and endotracheal tube intubation, then was rotated on the [facility] table where all pressure points were appropriately padded.  Patient was prepped prepped and draped in the usual fashion.   The incision was infiltrated with local anesthetic.  Comprehensive timeout was performed.  The incision was slowly dissected meticulously to avoid any damaging to the lead wires.  Once we came to the pocket and opened this it appeared to be a chronic hematoma as expected.  We then remove the battery and created a pocket above the incision.  We then continued to irrigate until we noticed that we had meticulous hemostasis both at the old pocket and the new pocket.  The old pocket was marsupialized.  The new pocket was created.  Both sites were irrigated.  The wounds were closed in layers with 0 and 2-0 vicryl.  The skin was approximated with STRATAFIX and glue.   A sterile dressing was applied.    Patient was then rotated back to the preoperative bed awakened from anesthesia and taken to recovery. All counts are correct in this case.   I performed the procedure without an Designer, television/film set.  * No  implants in log *  Penne MICAEL Sharps, MD

## 2024-06-26 NOTE — Discharge Instructions (Addendum)
 Your surgeon has performed an procedure implanting/reposition a Battery for Spinal Cord Stimulator Alone. This involved making an incision in the Flank.   The following are instructions to help in your recovery once you have been discharged from the hospital. Even if you feel well, it is important that you follow these activity guidelines. If you do not let your body heal properly from the surgery, you can increase the chance of return of your symptoms and other complications.  You will be working with your spinal cord stimulator team for programming and titrations.  Most patients require multiple titrations before we get the settings just right.  We appreciate your patience and working with us  to optimize your care.  *NSAIDs are okay to take after surgery  Activity    No bending, lifting, or twisting ("BLT"). Avoid lifting objects heavier than 10 pounds (gallon milk jug).  Where possible, avoid household activities that involve lifting, bending, reaching, pushing, or pulling such as laundry, vacuuming, grocery shopping, and childcare. Try to arrange for help from friends and family for these activities while your back heals.  Increase physical activity slowly as tolerated.  Taking short walks is encouraged, but avoid strenuous exercise. Do not jog, run, bicycle, lift weights, or participate in any other exercises unless specifically allowed by your doctor.  Talk to your doctor before resuming sexual activity.  You should not drive until cleared by your doctor.  Until released by your doctor, you should not return to work or school.  You should rest at home and let your body heal.   You may shower three days after your surgery.  After showering, lightly dab your incision dry. Do not take a tub bath or go swimming until approved by your doctor at your follow-up appointment.  If you smoke, we strongly recommend that you quit.  Smoking has been proven to interfere with normal bone healing and will  dramatically reduce the success rate of your surgery. Please contact [facility] (800-QUIT-NOW) and use the resources at www.[facility].com for assistance in stopping smoking.  Surgical Incision   If you have a dressing on your incision, you may remove it 3 days after your surgery. Keep your incision area clean and dry.  Your stitches are under your skin and dissolvable.  They are covered by surgical glue.  The glue should begin to peel away within about a week.  Diet           You may return to your usual diet. Be sure to stay hydrated.  When to Contact Us   You may experience pain in your neck and/or pain between your shoulder blades, or at your back depending on your incision sites.. This is normal and should improve in the next few weeks with the help of pain medication, muscle relaxers, and rest. Some patients report that a warm compress on the back of the neck or between the shoulder blades helps.  However, should you experience any of the following, contact us  immediately: New numbness or weakness Pain that is progressively getting worse, and is not relieved by your pain medication, muscle relaxers, rest, and warm compresses Bleeding, redness, swelling, pain, or drainage from surgical incision Chills or flu-like symptoms Fever greater than 101.0 F (38.3 C) Inability to eat, drink fluids, or take medications Problems with bowel or bladder functions Difficulty breathing or shortness of breath Warmth, tenderness, or swelling in your calf Contact Information How to contact us :  If you have any questions/concerns before or after surgery, you  can reach us  at [phone], or you can send a mychart message. We can be reached by phone or mychart 8am-4pm, Monday-Friday.  *Please note: Calls after 4pm are forwarded to a third party answering service. Mychart messages are not routinely monitored during evenings, weekends, and holidays. Please call our office to contact the answering service for  urgent concerns during non-business hours.

## 2024-06-26 NOTE — Progress Notes (Signed)
    HISTORY OF PRESENT ILLNESS: 06/26/2024 Ms. Remmington Teters is status post thoracic laminectomy for spinal cord stimulator placement.  She is originally having very good coverage in her bilateral lower extremities.  She did not have good coverage of her mid back pain.  She is being currently worked up for orthopedic issues regarding her hip.SABRA   PHYSICAL EXAMINATION:  General: Patient is well developed, well nourished, calm, collected, and in no apparent distress.  NEUROLOGICAL:  General: In no acute distress.  Awake, alert, oriented to person, place, and time. Pupils equal round and reactive to light.   Strength: Some pain limitation especially in the right hip, no major deficits noted  Incision c/d/i, superficial battery  ROS (Neurologic): Negative except as noted above  IMAGING: Awaiting x-rays  ASSESSMENT/PLAN:  Neville Pauls is here today after spinal cord stimulator placement and thoracic spine with paddle lead.  She was originally doing well with very good coverage in her bilateral lower extremities.  She has had some multiple falls and has never had good coverage of her back pain despite the good leg coverage.  I will plan to get x-rays of her system to make sure that it continues to be in continuity given her multiple previous falls.  I will also follow-up with the stimulator rep as they are going to interrogate to see whether or not there is good connection between the leads and the stimulator.  Xrays showed good integrity, interrogation should good integrity. Plan to go forward with a site revision without side relocation.    Penne MICAEL Sharps, MD/MS Department of Neurosurgery

## 2024-06-26 NOTE — Anesthesia Preprocedure Evaluation (Signed)
 Anesthesia Evaluation  Patient identified by MRN, date of birth, ID band Patient awake    Reviewed: Allergy & Precautions, NPO status , Patient's Chart, lab work & pertinent test results  History of Anesthesia Complications (+) PONV, PROLONGED EMERGENCE and history of anesthetic complications  Airway Mallampati: III  TM Distance: >3 FB Neck ROM: full    Dental  (+) Chipped, Poor Dentition, Missing   Pulmonary asthma    Pulmonary exam normal        Cardiovascular Exercise Tolerance: Good (-) angina (-) Past MI and (-) DOE negative cardio ROS Normal cardiovascular exam     Neuro/Psych  Headaches, neg Seizures PSYCHIATRIC DISORDERS       Neuromuscular disease    GI/Hepatic Neg liver ROS,GERD  Controlled,,  Endo/Other  Hypothyroidism    Renal/GU Renal disease     Musculoskeletal   Abdominal   Peds  Hematology negative hematology ROS (+)   Anesthesia Other Findings Past Medical History: No date: Anemia No date: Anesthesia complication     Comment:  a.) PONV; b.) delayed emergence No date: Anxiety No date: Asthma No date: Benign cyst of kidney 01/18/2023: Breast cancer (HCC)     Comment:  left breast-bil mastectomies No date: Cervical cancer (HCC) No date: Chronic back pain No date: Chronic hypotension No date: CKD (chronic kidney disease), stage II No date: Claustrophobia No date: Decompression injury of spinal cord No date: Depression No date: Dizziness No date: Family history of adverse reaction to anesthesia     Comment:  a.) delayed emergence in 1st degree female relative               (mother) 01/18/2018: History of dysplastic nevus     Comment:  right shoulder, recurrent dysplastic nevus No date: History of kidney stones No date: History of shingles No date: Hypercholesteremia No date: Hypothyroidism No date: Inappropriate sinus node tachycardia No date: Migraine No date: Morbid obesity (HCC) No  date: Multiple allergies No date: Neurogenic orthostatic hypotension (HCC) No date: Obesity No date: Osteoarthritis     Comment:  left hip No date: Pneumonia No date: PONV (postoperative nausea and vomiting) 01/24/2024: S/P insertion of spinal cord stimulator 2021: Seizure (HCC)     Comment:  x1-after a back surgery with spinal cord stimulator 08/11/2020: Spinal cord stroke (HCC)     Comment:  spinal cord puncture during surgery No date: Wears glasses  Past Surgical History: No date: ABDOMINAL HYSTERECTOMY No date: APPENDECTOMY No date: BACK SURGERY No date: BILIOPANCREATIC DIVISION W DUODENAL SWITCH; N/A 12/06/2022: BREAST BIOPSY; Left     Comment:  u/s bx,1:00 heart clip, path pending 12/06/2022: BREAST BIOPSY; Left     Comment:  us  bx 2:00 ribbon clip path pending 12/06/2022: BREAST BIOPSY; Left     Comment:  US  LT BREAST BX W LOC DEV EA ADD LESION IMG BX SPEC US                GUIDE 12/06/2022 [facility] 12/06/2022: BREAST BIOPSY; Left     Comment:  US  LT BREAST BX W LOC DEV 1ST LESION IMG BX SPEC US                GUIDE 12/06/2022 [facility] 01/18/2023: BREAST RECONSTRUCTION WITH PLACEMENT OF TISSUE EXPANDER  AND FLEX HD (ACELLULAR HYDRATED DERMIS); Bilateral     Comment:  Procedure: IMMEDIATE LEFT BREAST RECONSTRUCTION WITH               PLACEMENT OF TISSUE EXPANDER AND FLEX HD (ACELLULAR  HYDRATED DERMIS);  Surgeon: [name], DO;                Location: [facility];  Service: Plastics;  Laterality:               Bilateral; 03/26/2023: BREAST RECONSTRUCTION WITH PLACEMENT OF TISSUE EXPANDER  AND FLEX HD (ACELLULAR HYDRATED DERMIS); Left     Comment:  Procedure: BREAST RECONSTRUCTION WITH PLACEMENT OF               TISSUE EXPANDER;  Surgeon: [name], DO;                Location: [facility];  Service: Plastics;               Laterality: Left; No date: CHOLECYSTECTOMY 06/27/2021: COLONOSCOPY; N/A     Comment:   Procedure: COLONOSCOPY;  Surgeon: Onita Elspeth Sharper,              DO;  Location[facility];  Service:               Gastroenterology;  Laterality: N/A; 08/27/2023: COLONOSCOPY; N/A     Comment:  Procedure: COLONOSCOPY;  Surgeon: Onita Elspeth Sharper,              DO;  Location: [facility];  Service:               Gastroenterology;  Laterality: N/A; No date: DIAGNOSTIC LAPAROSCOPY     Comment:  LOA 06/27/2021: ESOPHAGOGASTRODUODENOSCOPY; N/A     Comment:  Procedure: ESOPHAGOGASTRODUODENOSCOPY (EGD);  Surgeon:               Onita Elspeth Sharper, DO;  Location: [facility] ENDOSCOPY;                Service: Gastroenterology;  Laterality: N/A; 07/25/2017: ESOPHAGOGASTRODUODENOSCOPY (EGD) WITH PROPOFOL ; N/A     Comment:  Procedure: ESOPHAGOGASTRODUODENOSCOPY (EGD) WITH               PROPOFOL ;  Surgeon: Viktoria Lamar DASEN, MD;  Location:               [facility];  Service: Endoscopy;  Laterality: N/A; No date: HERNIA REPAIR No date: KNEE ARTHROSCOPY; Right No date: LAPAROSCOPIC GASTRIC SLEEVE RESECTION WITH HIATAL HERNIA  REPAIR 05/21/2024: LIPOSUCTION WITH LIPOFILLING; Bilateral     Comment:  Procedure: LIPOSUCTION, WITH FAT TRANSFER;  Surgeon:               [name], DO;  Location: [facility];  Service: Plastics;  Laterality: Bilateral;                Bilateral breast fat grafting 08/29/2022: LUMBAR FUSION     Comment:  Revision Lumbar two through five Laminectomy & Fusion               with Transforaminal Lumbar interbody fusion 01/18/2023: MASTECTOMY W/ SENTINEL NODE BIOPSY; Bilateral     Comment:  Procedure: MASTECTOMY WITH SENTINEL LYMPH NODE BIOPSY,               RNFA to assist;  Surgeon: Jordis Laneta FALCON, MD;  Location:               [facility];  Service: General;  Laterality: Bilateral; 01/24/2024: PULSE GENERATOR IMPLANT; N/A     Comment:  Procedure: UNILATERAL PULSE GENERATOR IMPLANT;  Surgeon:  [name] ORN, MD;   Location: [facility];  Service:               Neurosurgery;  Laterality: N/A;  INTERNAL PULSE GENERATOR              PLACEMENT FOR SPINAL CORD STIMULATOR 06/06/2023: REMOVAL OF TISSUE EXPANDER AND PLACEMENT OF IMPLANT;  Bilateral     Comment:  Procedure: REMOVAL OF TISSUE EXPANDER AND PLACEMENT OF               IMPLANT;  Surgeon: [name], DO;  Location:               [facility];  Service: Plastics;                Laterality: Bilateral; No date: SPINAL CORD STIMULATOR IMPLANT 01/15/2024: THORACIC LAMINECTOMY FOR SPINAL CORD STIMULATOR; N/A     Comment:  Procedure: THORACIC LAMINECTOMY FOR SPINAL CORD               STIMULATOR;  Surgeon: [name] ORN, MD;  Location:               [facility];  Service: Neurosurgery;  Laterality: N/A;                THORACIC LAMINECTOMY FOR SPINAL CORD STIMULATOR PADDLE               TRIAL No date: TONSILLECTOMY 01/26/2016: TOTAL HIP ARTHROPLASTY; Left     Comment:  Procedure: LEFT TOTAL HIP ARTHROPLASTY ANTERIOR               APPROACH;  Surgeon: Kay CHRISTELLA Cummins, MD;  Location: [facility];                Service: Orthopedics;  Laterality: Left; 08/2019: TRANSFORAMINAL LUMBAR INTERBODY FUSION (TLIF) WITH PEDICLE  SCREW FIXATION 3 LEVEL     Comment:  Lynwood Better, MD L2-L5     Reproductive/Obstetrics negative OB ROS                              Anesthesia Physical Anesthesia Plan  ASA: 3  Anesthesia Plan: General ETT   Post-op Pain Management:    Induction: Intravenous  PONV Risk Score and Plan: Ondansetron , Dexamethasone , Midazolam , Treatment may vary due to age or medical condition, TIVA and Propofol  infusion  Airway Management Planned: Oral ETT  Additional Equipment:   Intra-op Plan:   Post-operative Plan: Extubation in OR  Informed Consent: I have reviewed the patients History and Physical, chart, labs and discussed the procedure including the risks, benefits and alternatives for the proposed  anesthesia with the patient or authorized representative who has indicated his/her understanding and acceptance.     Dental Advisory Given  Plan Discussed with: Anesthesiologist, CRNA and Surgeon  Anesthesia Plan Comments: (Patient consented for risks of anesthesia including but not limited to:  - adverse reactions to medications - damage to eyes, teeth, lips or other oral mucosa - nerve damage due to positioning  - sore throat or hoarseness - Damage to heart, brain, nerves, lungs, other parts of body or loss of life  Patient voiced understanding and assent.)        Anesthesia Quick Evaluation

## 2024-06-26 NOTE — Anesthesia Procedure Notes (Signed)
 Procedure Name: Intubation Date/Time: 06/26/2024 8:41 AM  Performed by: Lennie Lamarr HERO, CRNAPre-anesthesia Checklist: Patient identified, Emergency Drugs available, Suction available and Patient being monitored Patient Re-evaluated:Patient Re-evaluated prior to induction Oxygen Delivery Method: Circle system utilized Preoxygenation: Pre-oxygenation with 100% oxygen Induction Type: IV induction Ventilation: Mask ventilation without difficulty Laryngoscope Size: McGrath and 3 Grade View: Grade I Tube type: Oral Tube size: 7.0 mm Number of attempts: 1 Airway Equipment and Method: Stylet Placement Confirmation: ETT inserted through vocal cords under direct vision, positive ETCO2 and breath sounds checked- equal and bilateral Secured at: 21 cm Tube secured with: Tape Dental Injury: Teeth and Oropharynx as per pre-operative assessment

## 2024-06-26 NOTE — Transfer of Care (Signed)
 Immediate Anesthesia Transfer of Care Note  Patient: Vanessa Medina  Procedure(s) Performed: Left sided internal pulse generator site revision (Left)  Patient Location: PACU  Anesthesia Type:General  Level of Consciousness: drowsy and patient cooperative  Airway & Oxygen Therapy: Patient Spontanous Breathing and Patient connected to face mask oxygen  Post-op Assessment: Report given to RN, Post -op Vital signs reviewed and stable, and Patient moving all extremities X 4  Post vital signs: Reviewed and stable  Last Vitals:  Vitals Value Taken Time  BP    Temp    Pulse 68 06/26/24 09:37  Resp 10 06/26/24 09:37  SpO2 100 % 06/26/24 09:37  Vitals shown include unfiled device data.  Last Pain:  Vitals:   06/26/24 0657  TempSrc: Temporal  PainSc: 0-No pain         Complications: No notable events documented.

## 2024-06-27 ENCOUNTER — Encounter: Payer: Self-pay | Admitting: Neurosurgery

## 2024-06-27 ENCOUNTER — Encounter: Admitting: Physician Assistant

## 2024-06-30 ENCOUNTER — Ambulatory Visit (INDEPENDENT_AMBULATORY_CARE_PROVIDER_SITE_OTHER): Admitting: Nurse Practitioner

## 2024-07-02 ENCOUNTER — Encounter: Admitting: Physician Assistant

## 2024-07-03 ENCOUNTER — Ambulatory Visit

## 2024-07-06 ENCOUNTER — Ambulatory Visit
Admission: EM | Admit: 2024-07-06 | Discharge: 2024-07-06 | Disposition: A | Discharge status: Home / Self Care | Attending: Emergency Medicine | Admitting: Emergency Medicine

## 2024-07-06 DIAGNOSIS — L308 Other specified dermatitis: Secondary | ICD-10-CM

## 2024-07-06 MED ORDER — PREDNISONE 10 MG (21) PO TBPK: ORAL_TABLET | Freq: Every day | ORAL | 0 refills | Status: DC

## 2024-07-06 MED ORDER — MUPIROCIN 2 % EX OINT: 1.0000 | TOPICAL_OINTMENT | Freq: Two times a day (BID) | CUTANEOUS | 0 refills | Status: AC

## 2024-07-06 MED ORDER — CETIRIZINE HCL 10 MG PO TABS: 10.0000 mg | ORAL_TABLET | Freq: Every day | ORAL | 0 refills | Status: DC

## 2024-07-06 NOTE — ED Provider Notes (Signed)
 CAY RALPH PELT    CSN: [id number] Arrival date & time: 07/06/24  1231      History   Chief Complaint Chief Complaint  Patient presents with   Rash    HPI Vanessa Medina is a 53 y.o. female.  Patient presents with 3-day history of intense itching of both hands particularly on her palms and in between her fingers.  She has scratched some of the areas in between her fingers rale.  She reports no visible rash on her hands.  No fever, chills, drainage.  Treatment attempted with frequent handwashing with antibacterial soap, bleach, acetone.  Patient is concerned because her kitten has ear mites and she is worried that she may have gotten them on her hands.  The history is provided by the patient and medical records.    Past Medical History:  Diagnosis Date   Anemia    Anesthesia complication    a.) PONV; b.) delayed emergence   Anxiety    Asthma    Benign cyst of kidney    Breast cancer (HCC) 01/18/2023   left breast-bil mastectomies   Cervical cancer (HCC)    Chronic back pain    Chronic hypotension    CKD (chronic kidney disease), stage II    Claustrophobia    Decompression injury of spinal cord    Depression    Dizziness    Family history of adverse reaction to anesthesia    a.) delayed emergence in 1st degree female relative (mother)   History of dysplastic nevus 01/18/2018   right shoulder, recurrent dysplastic nevus   History of kidney stones    History of shingles    Hypercholesteremia    Hypothyroidism    Inappropriate sinus node tachycardia    Migraine    Morbid obesity (HCC)    Multiple allergies    Neurogenic orthostatic hypotension (HCC)    Obesity    Osteoarthritis    left hip   Pneumonia    PONV (postoperative nausea and vomiting)    S/P insertion of spinal cord stimulator 01/24/2024   Seizure (HCC) 2021   x1-after a back surgery with spinal cord stimulator   Spinal cord stroke (HCC) 08/11/2020   spinal cord puncture during surgery    Wears glasses     Patient Active Problem List   Diagnosis Date Noted   Spinal cord stimulator dysfunction, sequela 06/12/2024   Breast asymmetry following reconstructive surgery 03/25/2024   S/P insertion of spinal cord stimulator 03/21/2024   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   Pain and swelling of lower leg 09/25/2023   Lymphedema 09/25/2023   Long term (current) use of aromatase inhibitors 09/07/2023   At risk for loss of bone density 09/07/2023   Changing skin lesion 09/04/2023   Frequent falls 08/22/2023   Chronic pain syndrome 08/22/2023   Bilateral lower extremity edema 08/06/2023   Back pain 08/06/2023   Claustrophobia    Pernicious anemia 03/14/2023   Ruptured left breast implant 02/13/2023   S/P mastectomy, bilateral 01/18/2023   Genetic testing 12/18/2022   Breast cancer (HCC) 12/08/2022   Family history of breast cancer 12/08/2022   Profound fatigue 12/08/2022   Lumbar post-laminectomy syndrome 06/02/2022   Pseudarthrosis after fusion or arthrodesis 05/19/2022   Primary osteoarthritis of right knee 03/28/2022   Subjective tinnitus of both ears 12/21/2021   Abnormality of gait 04/29/2021   Abnormal sensation in both ears 04/06/2021   Other adverse food reactions, not elsewhere classified, subsequent encounter 04/06/2021   Multiple  drug allergies 04/06/2021   Insomnia due to medical condition 02/25/2021   Myofascial muscle pain 02/25/2021   Nerve pain 10/18/2020   Incomplete paraplegia (HCC) 10/18/2020   Orthostatic hypotension 10/18/2020   Renal cyst 09/23/2020   Chronic migraine without aura without status migrainosus, not intractable    Hypokalemia    Adjustment reaction with anxiety and depression    Spinal cord injury, lumbar, without spinal bone injury, sequela 08/18/2020   Paraparesis (HCC)    Post-operative pain    Hypotension    Slow transit constipation    AKI (acute kidney injury)    Right leg weakness 08/11/2020   DDD (degenerative disc  disease), lumbar 09/02/2019    Class: Chronic   Degenerative disc disease, lumbar 09/02/2019   Chest tightness 09/23/2018   Bradycardia 09/23/2018   BMI 40.0-44.9, adult (HCC) 04/17/2018   Status post bariatric surgery 04/17/2018   Labile blood pressure 03/08/2018   Chronic low back pain 09/03/2017   History of total hip replacement, left 01/26/2016   Osteoarthritis of spine with radiculopathy, lumbar region 10/16/2014   LEG PAIN, BILATERAL 12/16/2007   Malignant neoplasm of cervix uteri (HCC) 07/02/2007   MORBID OBESITY 07/02/2007   DEPRESSION 07/02/2007   Migraine without aura 07/02/2007   Other allergic rhinitis 07/02/2007   Asthma 07/02/2007   GERD 07/02/2007   ELEVATED BLOOD PRESSURE WITHOUT DIAGNOSIS OF HYPERTENSION 07/02/2007   Asthma 07/02/2007    Past Surgical History:  Procedure Laterality Date   ABDOMINAL HYSTERECTOMY     APPENDECTOMY     BACK SURGERY     BILIOPANCREATIC DIVISION W DUODENAL SWITCH N/A    BREAST BIOPSY Left 12/06/2022   u/s bx,1:00 heart clip, path pending   BREAST BIOPSY Left 12/06/2022   us  bx 2:00 ribbon clip path pending   BREAST BIOPSY Left 12/06/2022   US  LT BREAST BX W LOC DEV EA ADD LESION IMG BX SPEC US  GUIDE 12/06/2022 [facility]   BREAST BIOPSY Left 12/06/2022   US  LT BREAST BX W LOC DEV 1ST LESION IMG BX SPEC US  GUIDE 12/06/2022 [facility]   BREAST RECONSTRUCTION WITH PLACEMENT OF TISSUE EXPANDER AND FLEX HD (ACELLULAR HYDRATED DERMIS) Bilateral 01/18/2023   Procedure: IMMEDIATE LEFT BREAST RECONSTRUCTION WITH PLACEMENT OF TISSUE EXPANDER AND FLEX HD (ACELLULAR HYDRATED DERMIS);  Surgeon: [name], DO;  Location: [facility];  Service: Plastics;  Laterality: Bilateral;   BREAST RECONSTRUCTION WITH PLACEMENT OF TISSUE EXPANDER AND FLEX HD (ACELLULAR HYDRATED DERMIS) Left 03/26/2023   Procedure: BREAST RECONSTRUCTION WITH PLACEMENT OF TISSUE EXPANDER;  Surgeon: [name], DO;  Location: [facility];   Service: Plastics;  Laterality: Left;   CHOLECYSTECTOMY     COLONOSCOPY N/A 06/27/2021   Procedure: COLONOSCOPY;  Surgeon: Onita Elspeth Sharper, DO;  Location: [facility];  Service: Gastroenterology;  Laterality: N/A;   COLONOSCOPY N/A 08/27/2023   Procedure: COLONOSCOPY;  Surgeon: Onita Elspeth Sharper, DO;  Location: [facility]COPY;  Service: Gastroenterology;  Laterality: N/A;   DIAGNOSTIC LAPAROSCOPY     LOA   ESOPHAGOGASTRODUODENOSCOPY N/A 06/27/2021   Procedure: ESOPHAGOGASTRODUODENOSCOPY (EGD);  Surgeon: Onita Elspeth Sharper, DO;  Location: [facility] ENDOSCOPY;  Service: Gastroenterology;  Laterality: N/A;   ESOPHAGOGASTRODUODENOSCOPY (EGD) WITH PROPOFOL  N/A 07/25/2017   Procedure: ESOPHAGOGASTRODUODENOSCOPY (EGD) WITH PROPOFOL ;  Surgeon: Viktoria Lamar DASEN, MD;  Location: [facility] ENDOSCOPY;  Service: Endoscopy;  Laterality: N/A;   HERNIA REPAIR     KNEE ARTHROSCOPY Right    LAPAROSCOPIC GASTRIC SLEEVE RESECTION WITH HIATAL HERNIA REPAIR  LIPOSUCTION WITH LIPOFILLING Bilateral 05/21/2024   Procedure: LIPOSUCTION, WITH FAT TRANSFER;  Surgeon: [name], DO;  Location: [facility];  Service: Plastics;  Laterality: Bilateral;  Bilateral breast fat grafting   LUMBAR FUSION  08/29/2022   Revision Lumbar two through five Laminectomy & Fusion with Transforaminal Lumbar interbody fusion   LUMBAR SPINAL CORD SIMULATOR REVISION Left 06/26/2024   Procedure: Left sided internal pulse generator site revision;  Surgeon: [name] ORN, MD;  Location: [facility];  Service: Neurosurgery;  Laterality: Left;   MASTECTOMY W/ SENTINEL NODE BIOPSY Bilateral 01/18/2023   Procedure: MASTECTOMY WITH SENTINEL LYMPH NODE BIOPSY, RNFA to assist;  Surgeon: Jordis Laneta FALCON, MD;  Location: [facility];  Service: General;  Laterality: Bilateral;   PULSE GENERATOR IMPLANT N/A 01/24/2024   Procedure: UNILATERAL PULSE GENERATOR IMPLANT;  Surgeon: [name] ORN, MD;  Location: [facility];  Service:  Neurosurgery;  Laterality: N/A;  INTERNAL PULSE GENERATOR PLACEMENT FOR SPINAL CORD STIMULATOR   REMOVAL OF TISSUE EXPANDER AND PLACEMENT OF IMPLANT Bilateral 06/06/2023   Procedure: REMOVAL OF TISSUE EXPANDER AND PLACEMENT OF IMPLANT;  Surgeon: [name], DO;  Location: [facility];  Service: Plastics;  Laterality: Bilateral;   SPINAL CORD STIMULATOR IMPLANT     THORACIC LAMINECTOMY FOR SPINAL CORD STIMULATOR N/A 01/15/2024   Procedure: THORACIC LAMINECTOMY FOR SPINAL CORD STIMULATOR;  Surgeon: [name] ORN, MD;  Location: [facility];  Service: Neurosurgery;  Laterality: N/A;  THORACIC LAMINECTOMY FOR SPINAL CORD STIMULATOR PADDLE TRIAL   TONSILLECTOMY     TOTAL HIP ARTHROPLASTY Left 01/26/2016   Procedure: LEFT TOTAL HIP ARTHROPLASTY ANTERIOR APPROACH;  Surgeon: Kay CHRISTELLA Cummins, MD;  Location: [facility];  Service: Orthopedics;  Laterality: Left;   TRANSFORAMINAL LUMBAR INTERBODY FUSION (TLIF) WITH PEDICLE SCREW FIXATION 3 LEVEL  08/2019   Lynwood Better, MD L2-L5    OB History   No obstetric history on file.      Home Medications    Prior to Admission medications   Medication Sig Start Date End Date Taking? Authorizing Provider  cetirizine (ZYRTEC ALLERGY) 10 MG tablet Take 1 tablet (10 mg total) by mouth daily for 14 days. 07/06/24 07/20/24 Yes Corlis Burnard DEL, NP  mupirocin  ointment (BACTROBAN ) 2 % Apply 1 Application topically 2 (two) times daily. 07/06/24  Yes Corlis Burnard DEL, NP  predniSONE (STERAPRED UNI-PAK 21 TAB) 10 MG (21) TBPK tablet Take by mouth daily. As directed 07/06/24  Yes Corlis Burnard DEL, NP  acetaminophen  (TYLENOL ) 500 MG tablet Take 1 tablet (500 mg total) by mouth every 6 (six) hours as needed. 06/26/24   Ulis Bottcher, PA-C  ALPRAZolam  (XANAX ) 0.25 MG tablet Take 0.25 mg by mouth at bedtime as needed for sleep. 11/09/23   [provider]  azelastine (ASTELIN) 0.1 % nasal spray Place 1 spray into both nostrils 2 (two) times daily as needed for  allergies. Use in each nostril as directed    [provider]  baclofen  (LIORESAL ) 10 MG tablet Take 1 tablet (10 mg total) by mouth 3 (three) times daily. - for spasms 11/30/23   Lovorn, Megan, MD  Calcium  Carb-Cholecalciferol (CALCIUM +D3 PO) Take 1 tablet by mouth in the morning and at bedtime.    [provider]  colestipol (COLESTID) 1 g tablet Take 1 g by mouth at bedtime. 08/30/23 08/29/24  [provider]  diclofenac  Sodium (VOLTAREN ) 1 % GEL Apply 2 g topically 4 (four) times daily as needed (pain). 02/04/24   Cummins,  Kay HERO, MD  docusate sodium  (COLACE) 100 MG capsule Take 1 capsule (100 mg total) by mouth 2 (two) times daily. 06/26/24   Ulis Bottcher, PA-C  DULoxetine  (CYMBALTA ) 60 MG capsule Take 2 capsules (120 mg total) by mouth at bedtime. 08/22/23   Lovorn, Megan, MD  EPINEPHrine  0.3 mg/0.3 mL IJ SOAJ injection Inject 0.3 mg into the muscle as needed for anaphylaxis. 03/22/21   [provider]  fluticasone  (FLONASE ) 50 MCG/ACT nasal spray Place 2 sprays into both nostrils daily as needed for allergies.    [provider]  furosemide (LASIX) 20 MG tablet Take 20 mg by mouth at bedtime as needed for fluid or edema. 08/30/23   [provider]  gabapentin  (NEURONTIN ) 300 MG capsule Take 1 capsule (300 mg total) by mouth at bedtime. For nerve pain 11/30/23   Lovorn, Megan, MD  HYDROcodone -acetaminophen  (NORCO/VICODIN) 5-325 MG tablet Take 1 tablet by mouth every 6 (six) hours as needed for moderate pain (pain score 4-6). 06/26/24   Ulis Bottcher, PA-C  hydrOXYzine  (ATARAX ) 10 MG tablet Take 1 tablet (10 mg total) by mouth 3 (three) times daily as needed. 01/18/24   Ulis Bottcher, PA-C  letrozole  (FEMARA ) 2.5 MG tablet Take 2.5 mg by mouth daily. 12/28/23   [provider]  levothyroxine (SYNTHROID) 25 MCG tablet Take 25 mcg by mouth daily before breakfast. 04/18/24 04/18/25  [provider]  lidocaine  (LIDODERM ) 5 % Place 1  patch onto the skin daily for 18 days. Remove & Discard patch within 12 hours or as directed by MD 06/12/24 06/30/24  [name] ORN, MD  meloxicam  (MOBIC ) 7.5 MG tablet TAKE 1-2 TABLETS EVERY DAY WITH FOOD. 05/19/24   Gregory Edsel Ruth, PA  methocarbamol  (ROBAXIN ) 500 MG tablet Take 1 tablet (500 mg total) by mouth every 8 (eight) hours as needed for muscle spasms. 02/22/24   Ulis Bottcher, PA-C  montelukast  (SINGULAIR ) 10 MG tablet TAKE 1 TABLET BY MOUTH AT  BEDTIME FOR ALLERGIES 01/04/23   Lovorn, Megan, MD  Multiple Vitamin (MULTIVITAMIN WITH MINERALS) TABS tablet Take 1 tablet by mouth 2 (two) times daily. 08/31/20   Love, Sharlet RAMAN, PA-C  pramipexole  (MIRAPEX ) 0.125 MG tablet Take 0.5 mg by mouth at bedtime. 07/31/17   [provider]  pyridOXINE (VITAMIN B-6) 100 MG tablet Take 1 tablet (100 mg total) by mouth daily. 09/10/20   Ladona Heinz, MD  senna (SENOKOT) 8.6 MG TABS tablet Take 1 tablet (8.6 mg total) by mouth daily. 06/26/24   Ulis Bottcher, PA-C  thiamine 250 MG tablet Take 250 mg by mouth at bedtime.    [provider]  tolterodine  (DETROL  LA) 4 MG 24 hr capsule Take 1 capsule (4 mg total) by mouth every evening. 03/18/24   Jacobo Evalene PARAS, MD  topiramate  (TOPAMAX ) 100 MG tablet Take 1 tablet (100 mg total) by mouth 2 (two) times daily. Take 1 tab BID- but if you cannot tolerate, take 200 mg at bedtime- for headache prevention 06/20/24   Lovorn, Megan, MD  vitamin B-12 (CYANOCOBALAMIN ) 1000 MCG tablet Take 1 tablet (1,000 mcg total) by mouth daily. 09/10/20   Ladona Heinz, MD    Family History Family History  Problem Relation Age of Onset   Renal Disease Mother    Hypertension Mother    Sudden Cardiac Death Mother    Heart failure Mother    Valvular heart disease Mother    Heart disease Mother    Heart disease Father  Just had stents placed   Stroke Brother    Heart attack Brother 36   Breast cancer Maternal Grandmother    Diabetes Other     Social  History Social History   Tobacco Use   Smoking status: Never    Passive exposure: Never   Smokeless tobacco: Never  Vaping Use   Vaping status: Never Used  Substance Use Topics   Alcohol use: Yes    Comment: occasional wine   Drug use: No     Allergies   Bee venom, Iodinated contrast media, Latex, Morphine , Shellfish allergy, Codeine , Meperidine hcl, Bee pollen, Oxycodone , Tape, Pentazocine lactate, and Propoxyphene   Review of Systems Review of Systems  Constitutional:  Negative for chills and fever.  HENT:  Negative for sore throat, trouble swallowing and voice change.   Respiratory:  Negative for cough and shortness of breath.   Musculoskeletal:  Negative for arthralgias and joint swelling.  Skin:  Positive for rash. Negative for color change.     Physical Exam Triage Vital Signs ED Triage Vitals [07/06/24 1256]  Encounter Vitals Group     BP      Girls Systolic BP Percentile      Girls Diastolic BP Percentile      Boys Systolic BP Percentile      Boys Diastolic BP Percentile      Pulse      Resp      Temp      Temp src      SpO2      Weight      Height      Head Circumference      Peak Flow      Pain Score 0     Pain Loc      Pain Education      Exclude from Growth Chart    No data found.  Updated Vital Signs BP 117/82   Pulse 93   Temp (!) 97 F (36.1 C)   Resp 17   SpO2 98%   Visual Acuity Right Eye Distance:   Left Eye Distance:   Bilateral Distance:    Right Eye Near:   Left Eye Near:    Bilateral Near:     Physical Exam Constitutional:      General: She is not in acute distress. HENT:     Mouth/Throat:     Mouth: Mucous membranes are moist.  Cardiovascular:     Rate and Rhythm: Normal rate.  Pulmonary:     Effort: Pulmonary effort is normal. No respiratory distress.  Skin:    General: Skin is warm and dry.     Findings: No rash.     Comments: Both hands: No visible rash; a few superficial open skin areas where patient has  scratched them between fingers.  No drainage.  Neurological:     Mental Status: She is alert.      UC Treatments / Results  Labs (all labs ordered are listed, but only abnormal results are displayed) Labs Reviewed - No data to display  EKG   Radiology No results found.  Procedures Procedures (including critical care time)  Medications Ordered in UC Medications - No data to display  Initial Impression / Assessment and Plan / UC Course  I have reviewed the triage vital signs and the nursing notes.  Pertinent labs & imaging results that were available during my care of the patient were reviewed by me and considered in my medical decision making (see chart  for details).    Pruritic dermatitis.  Afebrile and vital signs are stable.  Patient does have some raw areas in between her fingers that she has scratched open but this does not appear to be scabies.  No visible rash.  Treating today with prednisone, Zyrtec, and mupirocin  ointment for the areas that she has scratched open.  Instructed patient to stop scratching her hands, stop applying bleach and acetone, stop washing her hands so frequently with antibacterial soap.  Discussed trimming her nails and wearing gloves at night to prevent scratching at night.  Instructed her to follow-up with her PCP if she is not improving.  She agrees to plan of care.  Final Clinical Impressions(s) / UC Diagnoses   Final diagnoses:  Pruritic dermatitis     Discharge Instructions      Please stop scratching your hands.  Apply the mupirocin  ointment to the areas that you have scratched raw.    Take the Zyrtec and prednisone as directed.    Please do not put bleach or acetone on your hands.    Consider trimming your nails and wear gloves at night to keep from scratching.     ED Prescriptions     Medication Sig Dispense Auth. Provider   mupirocin  ointment (BACTROBAN ) 2 % Apply 1 Application topically 2 (two) times daily. 22 g Corlis Burnard DEL, NP   cetirizine (ZYRTEC ALLERGY) 10 MG tablet Take 1 tablet (10 mg total) by mouth daily for 14 days. 14 tablet Corlis Burnard DEL, NP   predniSONE (STERAPRED UNI-PAK 21 TAB) 10 MG (21) TBPK tablet Take by mouth daily. As directed 21 tablet Corlis Burnard DEL, NP      PDMP not reviewed this encounter.   Corlis Burnard DEL, NP 07/06/24 [phone]

## 2024-07-06 NOTE — ED Triage Notes (Signed)
 Patient to Urgent Care with complaints of rash and itching to her bilateral hands. Symptoms x3 days.   Reports her kitten has ear mites and symptoms started after she was rubbing its ears.   Has attempted soaking her hands in bleach and acetone.

## 2024-07-06 NOTE — Discharge Instructions (Addendum)
 Please stop scratching your hands.  Apply the mupirocin  ointment to the areas that you have scratched raw.    Take the Zyrtec and prednisone as directed.    Please do not put bleach or acetone on your hands.    Consider trimming your nails and wear gloves at night to keep from scratching.

## 2024-07-07 ENCOUNTER — Ambulatory Visit: Admitting: Physician Assistant

## 2024-07-07 ENCOUNTER — Encounter: Payer: Self-pay | Admitting: Physician Assistant

## 2024-07-07 ENCOUNTER — Encounter: Payer: Self-pay | Admitting: Radiology

## 2024-07-07 VITALS — BP 125/76 | HR 79 | Ht 64.0 in | Wt 270.8 lb

## 2024-07-07 DIAGNOSIS — C50812 Malignant neoplasm of overlapping sites of left female breast: Secondary | ICD-10-CM

## 2024-07-07 DIAGNOSIS — Z170 Estrogen receptor positive status [ER+]: Secondary | ICD-10-CM

## 2024-07-07 NOTE — Progress Notes (Signed)
 Patient is a pleasant 53 year old female s/p fat grafting to bilateral reconstructed breasts performed 05/21/2024 by Dr. Lowery who presents to clinic for postoperative follow-up.  She was last seen here in clinic on 06/10/2024.  At that time, she was overall pleased with the improvement of the contour of her breasts.  Since then, patient had a fall and has had a painful IPG site.  She had repositioning of the spinal cord stimulator performed 06/26/2024.  Yesterday she went to the urgent care for 3-day history of intense pruritus on her hands.  Evidently her kitten has ear mites and she was concerned for infestation.  No obvious scabies on exam.  Treating her pruritic dermatitis with steroids and antihistamines.  Today, patient is doing well from a postoperative standpoint, at least with regard to the recent breast surgery.  She is a bit tender from her recent back surgery.  But she denies any leg swelling, chest pain, difficulty breathing, or other concerns.  Overall she is pleased.    On exam, breasts and abdomen appear well-healed.  No palpable underlying seroma or fluid collections.  No overlying redness.  Single residual suture left breast that is removed without complication.  Follow-up regarding her abdominal liposuction and fat grafting to bilateral breasts only as needed.  Plan for her to follow-up with Dr. Lowery, as scheduled, for excision of left face and left breast skin lesions.

## 2024-07-09 ENCOUNTER — Ambulatory Visit (INDEPENDENT_AMBULATORY_CARE_PROVIDER_SITE_OTHER): Admitting: Physician Assistant

## 2024-07-09 VITALS — BP 132/66 | Temp 98.0°F | Ht 64.0 in | Wt 263.0 lb

## 2024-07-09 DIAGNOSIS — T85192S Other mechanical complication of implanted electronic neurostimulator (electrode) of spinal cord, sequela: Secondary | ICD-10-CM

## 2024-07-09 DIAGNOSIS — G894 Chronic pain syndrome: Secondary | ICD-10-CM

## 2024-07-09 DIAGNOSIS — Z09 Encounter for follow-up examination after completed treatment for conditions other than malignant neoplasm: Secondary | ICD-10-CM

## 2024-07-09 NOTE — Progress Notes (Signed)
   REFERRING PHYSICIAN:  Valora Lynwood FALCON, Md [address] [place],  [place] [address]  DOS: 06/26/24, left sided internal pulse generator site revision  HISTORY OF PRESENT ILLNESS: MYSTERY SCHRUPP is approximately 2 weeks status post IPG site revision. she is doing okay.  She is uncomfortable and is having an issue sleeping at night since she is a side sleeper.  She did have a period of time where she turned the stimulator off, but is turned on for the past day.  There is no new weakness, numbness or tingling.  She did have a local skin reaction to her bandage which has now improved.    PHYSICAL EXAMINATION:  General: Patient is well developed, well nourished, calm, collected, and in no apparent distress.   NEUROLOGICAL:  General: In no acute distress.   Awake, alert, oriented to person, place, and time.  Pupils equal round and reactive to light.  Facial tone is symmetric.     Strength: Strength is largely to baseline.  A significant amount of giveaway weakness is noted.            Incision c/d/i   ROS (Neurologic):  Negative except as noted above  IMAGING: No interval imaging.  ASSESSMENT/PLAN:  RAHA TENNISON is doing okay approximately 2 weeks after left-sided internal pulse generator site revision.  She is uncomfortable at her incision site and is having some difficulty sleeping.  She had a period of time where she turned her stimulator off, but she did turn it back on today.  She does not feel as though the stimulator is necessarily working properly, especially when she lays down.  Plan for follow-up in approximately 1 month for 6-week appointment.  I will notify the [employer] rep's to be able to come in person.I have advised the patient to lift up to 10 pounds until 6 weeks after surgery, then increase up to 25 pounds until 12 weeks after surgery.  After 12 weeks post-op, the patient advised to increase activity as tolerated.  Advised to contact the  office if any questions or concerns arise.  Lyle Decamp PA-C Department of neurosurgery

## 2024-07-22 ENCOUNTER — Telehealth: Payer: Self-pay | Admitting: Podiatry

## 2024-07-22 NOTE — Telephone Encounter (Signed)
 Patient contacted office requesting to reschedule surgery until 2026. Patient surgery rescheduled for 01/02/2025.

## 2024-07-30 ENCOUNTER — Ambulatory Visit (INDEPENDENT_AMBULATORY_CARE_PROVIDER_SITE_OTHER)

## 2024-07-30 ENCOUNTER — Encounter: Payer: Self-pay | Admitting: *Deleted

## 2024-07-30 DIAGNOSIS — L814 Other melanin hyperpigmentation: Secondary | ICD-10-CM | POA: Diagnosis not present

## 2024-07-30 DIAGNOSIS — W908XXA Exposure to other nonionizing radiation, initial encounter: Secondary | ICD-10-CM

## 2024-07-30 DIAGNOSIS — L281 Prurigo nodularis: Secondary | ICD-10-CM

## 2024-07-30 DIAGNOSIS — L821 Other seborrheic keratosis: Secondary | ICD-10-CM | POA: Diagnosis not present

## 2024-07-30 DIAGNOSIS — D1801 Hemangioma of skin and subcutaneous tissue: Secondary | ICD-10-CM

## 2024-07-30 DIAGNOSIS — Z1283 Encounter for screening for malignant neoplasm of skin: Secondary | ICD-10-CM | POA: Diagnosis not present

## 2024-07-30 DIAGNOSIS — Z86018 Personal history of other benign neoplasm: Secondary | ICD-10-CM

## 2024-07-30 DIAGNOSIS — D229 Melanocytic nevi, unspecified: Secondary | ICD-10-CM

## 2024-07-30 DIAGNOSIS — L578 Other skin changes due to chronic exposure to nonionizing radiation: Secondary | ICD-10-CM

## 2024-07-30 MED ORDER — DUPIXENT 300 MG/2ML ~~LOC~~ SOAJ: 600.0000 mg | Freq: Once | SUBCUTANEOUS | 0 refills | Status: AC

## 2024-07-30 MED ORDER — CLOBETASOL PROPIONATE 0.05 % EX OINT: TOPICAL_OINTMENT | CUTANEOUS | 1 refills | Status: AC

## 2024-07-30 MED ORDER — DUPIXENT 300 MG/2ML ~~LOC~~ SOAJ: 300.0000 mg | SUBCUTANEOUS | 5 refills | Status: AC

## 2024-07-30 NOTE — Progress Notes (Signed)
 Vanessa Medina called because her PCP is recommending she switch from Detrol  LA to Gemtesa for her bladder and wanted to make sure that is ok to take with letrozole .   Per Dr. Jacobo it is fine to switch to Gemtesa, there doesn't appear to be any interactions between the 2 medications, however he  would prefer the PCP prescribe that medication.   Vanessa Medina will notify her PCP of above.

## 2024-07-30 NOTE — Progress Notes (Signed)
 Subjective   Vanessa Medina is a 53 y.o. female who presents for the following: Total body skin exam for skin cancer screening and mole check. The patient has spots, moles and lesions to be evaluated, some may be new or changing and the patient may have concern these could be cancer.. Patient is new patient  Today patient reports: Pt c/o lesion that oozes, has been removed in the past by Dr.Dillingham, but has recurred. Pt states tender to the touch.  Review of Systems:    No other skin or systemic complaints except as noted in HPI or Assessment and Plan.  The following portions of the chart were reviewed this encounter and updated as appropriate: medications, allergies, medical history  Relevant Medical History:  Personal history of non melanoma skin cancer - see medical history for full details   Objective  (SKPE) Well appearing patient in no apparent distress; mood and affect are within normal limits. Examination was performed of the: Full Skin Examination: scalp, head, eyes, ears, nose, lips, neck, chest, axillae, abdomen, back, buttocks, bilateral upper extremities, bilateral lower extremities, hands, feet, fingers, toes, fingernails, and toenails.   Examination notable for: SKIN EXAM, Angioma(s): Scattered red vascular papule(s)  , Lentigo/lentigines: Scattered pigmented macules that are tan to brown in color and are somewhat non-uniform in shape and concentrated in the sun-exposed areas, Nevus/nevi: Scattered well-demarcated, regular, pigmented macule(s) and/or papule(s)  , Seborrheic Keratosis(es): Stuck-on appearing keratotic papule(s) on the trunk, some  irritated with redness, crusting, edema, and/or partial avulsion, Actinic Damage/Elastosis: chronic sun damage: dyspigmentation, telangiectasia, and wrinkling, Prurigo Nodularis: Nodular lesion with hyperpigmentation and slight scale. Evidence of excoriation  Examination limited by: n/a     Assessment & Plan  (SKAP)   SKIN  CANCER SCREENING PERFORMED TODAY.  BENIGN SKIN FINDINGS  - Lentigines  - Seborrheic keratoses  - Hemangiomas   - Nevus/Multiple Benign Nevi - Reassurance provided regarding the benign appearance of lesions noted on exam today; no treatment is indicated in the absence of symptoms/changes. - Reinforced importance of photoprotective strategies including liberal and frequent sunscreen use of a broad-spectrum SPF 30 or greater, use of protective clothing, and sun avoidance for prevention of cutaneous malignancy and photoaging.  Counseled patient on the importance of regular self-skin monitoring as well as routine clinical skin examinations as scheduled.   ACTINIC DAMAGE - Chronic condition, secondary to cumulative UV/sun exposure - Recommend daily broad spectrum sunscreen SPF 30+ to sun-exposed areas, reapply every 2 hours as needed.  - Staying in the shade or wearing long sleeves, sun glasses (UVA+UVB protection) and wide brim hats (4-inch brim around the entire circumference of the hat) are also recommended for sun protection.  - Call for new or changing lesions.  Personal history of dysplastic nevi  - Reviewed medical history for full details  - Reviewed sun protective measures as above - Encouraged full body skin exams    Prurigo nodularis - moderate stage, activity moderate (IGA CPD stage activity: 3/3)  Chronic and persistent condition with duration or expected duration over one year. Condition is symptomatic and bothersome to patient. Patient is flaring and not currently at treatment goal.  - prior bx on breast w/ spongiotic dermatitis. States frequently picks at scratches at lesions.  - Discussed diagnosis, typical course, and treatment options for this condition - Discussed itch scratch cycle and risk of kobenerization - Advised against excoriation (discussed itch-scratch cycle), keep nails short - Mosturization with Eucerin or Vaseline (purchase Cerave if desired), lukewarm  showers,  Dove sensitive skin soap to axilla/groin - Start clobetasol  0.05% ointment to nodules up to BID - Start dupilumab  - ADULTS - 600mg  subcutaneously on day 0, then 300mg  every other week starting day 14 - Side effects reviewed at length: injection site reactions, nasopharyngitis/conjunctivitis/URI, headache, exacerbation of atopic dermatitis, parasitic infection (be careful when traveling or camping) - Discussed needs to have container for sharps and proper disposal   Procedures, orders, diagnosis for this visit:  PRURIGO NODULARIS   Related Medications Dupilumab  (DUPIXENT ) 300 MG/2ML SOAJ Inject 600 mg into the skin once for 1 dose. On day 1. Dupilumab  (DUPIXENT ) 300 MG/2ML SOAJ Inject 300 mg into the skin every 14 (fourteen) days. Starting at day 15 for maintenance. clobetasol  ointment (TEMOVATE ) 0.05 % Apply 1 gram topically to affected area of skin twice daily. Stop once resolved and restart as needed for flares. Avoid use on face, armpits, groin unless otherwise indicated.  Prurigo nodularis -     Dupixent ; Inject 600 mg into the skin once for 1 dose. On day 1.  Dispense: 4 mL; Refill: 0 -     Dupixent ; Inject 300 mg into the skin every 14 (fourteen) days. Starting at day 15 for maintenance.  Dispense: 4 mL; Refill: 5 -     Clobetasol  Propionate; Apply 1 gram topically to affected area of skin twice daily. Stop once resolved and restart as needed for flares. Avoid use on face, armpits, groin unless otherwise indicated.  Dispense: 60 g; Refill: 1   Return to clinic: Return in about 1 year (around 07/30/2025) for TBSE - hx dysplastic nevus; 8 weeks prurigo nodularis follow up.  LILLETTE Rosina Mayans, CMA, am acting as scribe for Lauraine JAYSON Kanaris, MD .   Documentation: I have reviewed the above documentation for accuracy and completeness, and I agree with the above.  Lauraine JAYSON Kanaris, MD

## 2024-07-30 NOTE — Patient Instructions (Addendum)
 [address] [place], [place] - [address] [place] [address] Phone: [phone]  Fax: [phone]    Due to recent changes in healthcare laws, you may see results of your pathology and/or laboratory studies on MyChart before the doctors have had a chance to review them. We understand that in some cases there may be results that are confusing or concerning to you. Please understand that not all results are received at the same time and often the doctors may need to interpret multiple results in order to provide you with the best plan of care or course of treatment. Therefore, we ask that you please give us  2 business days to thoroughly review all your results before contacting the office for clarification. Should we see a critical lab result, you will be contacted sooner.   If You Need Anything After Your Visit  If you have any questions or concerns for your doctor, please call our main line at [phone] and press option 4 to reach your doctor's medical assistant. If no one answers, please leave a voicemail as directed and we will return your call as soon as possible. Messages left after 4 pm will be answered the following business day.   You may also send us  a message via MyChart. We typically respond to MyChart messages within 1-2 business days.  For prescription refills, please ask your pharmacy to contact our office. Our fax number is [phone].  If you have an urgent issue when the clinic is closed that cannot wait until the next business day, you can page your doctor at the number below.    Please note that while we do our best to be available for urgent issues outside of office hours, we are not available 24/7.   If you have an urgent issue and are unable to reach us , you may choose to seek medical care at your doctor's office, retail clinic, urgent care center, or emergency room.  If you have a medical emergency, please immediately call 911 or go to  the emergency department.  Pager Numbers  - Dr. Hester: [phone]  - Dr. Jackquline: [phone]  - Dr. Claudene: [phone]   - Dr. Raymund: [phone]  In the event of inclement weather, please call our main line at [phone] for an update on the status of any delays or closures.  Dermatology Medication Tips: Please keep the boxes that topical medications come in in order to help keep track of the instructions about where and how to use these. Pharmacies typically print the medication instructions only on the boxes and not directly on the medication tubes.   If your medication is too expensive, please contact our office at [phone] option 4 or send us  a message through MyChart.   We are unable to tell what your co-pay for medications will be in advance as this is different depending on your insurance coverage. However, we may be able to find a substitute medication at lower cost or fill out paperwork to get insurance to cover a needed medication.   If a prior authorization is required to get your medication covered by your insurance company, please allow us  1-2 business days to complete this process.  Drug prices often vary depending on where the prescription is filled and some pharmacies may offer cheaper prices.  The website www.goodrx.com contains coupons for medications through different pharmacies. The prices here do not account for what the cost may be with help from insurance (it  may be cheaper with your insurance), but the website can give you the price if you did not use any insurance.  - You can print the associated coupon and take it with your prescription to the pharmacy.  - You may also stop by our office during regular business hours and pick up a GoodRx coupon card.  - If you need your prescription sent electronically to a different pharmacy, notify our office through [facility] or by phone at [phone] option 4.     Si Usted Necesita Algo  Despus de Su Visita  Tambin puede enviarnos un mensaje a travs de Clinical Cytogeneticist. Por lo general respondemos a los mensajes de MyChart en el transcurso de 1 a 2 das hbiles.  Para renovar recetas, por favor pida a su farmacia que se ponga en contacto con nuestra oficina. Randi lakes de fax es [place] [phone].  Si tiene un asunto urgente cuando la clnica est cerrada y que no puede esperar hasta el siguiente da hbil, puede llamar/localizar a su doctor(a) al nmero que aparece a continuacin.   Por favor, tenga en cuenta que aunque hacemos todo lo posible para estar disponibles para asuntos urgentes fuera del horario de [place], no estamos disponibles las 24 horas del da, los 7 [address] de la [place].   Si tiene un problema urgente y no puede comunicarse con nosotros, puede optar por buscar atencin mdica  en el consultorio de su doctor(a), en una clnica privada, en un centro de atencin urgente o en una sala de emergencias.  Si tiene engineer, drilling, por favor llame inmediatamente al 911 o vaya a la sala de emergencias.  Nmeros de bper  - Dr. Hester: [phone]  - Dra. Jackquline: [phone]  - Dr. Claudene: [phone]  - Dra. Kitts: [phone]  En caso de inclemencias del [place], por favor llame a nuestra lnea principal al [phone] para una actualizacin sobre el estado de cualquier retraso o cierre.  Consejos para la medicacin en dermatologa: Por favor, guarde las cajas en las que vienen los medicamentos de uso tpico para ayudarle a seguir las instrucciones sobre dnde y cmo usarlos. Las farmacias generalmente imprimen las instrucciones del medicamento slo en las cajas y no directamente en los tubos del [place].   Si su medicamento es muy caro, por favor, pngase en contacto con landry rieger llamando al [phone] y presione la opcin 4 o envenos un mensaje a travs de Clinical Cytogeneticist.   No podemos decirle cul ser su copago por los medicamentos por  adelantado ya que esto es diferente dependiendo de la cobertura de su seguro. Sin embargo, es posible que podamos encontrar un medicamento sustituto a audiological scientist un formulario para que el seguro cubra el medicamento que se considera necesario.   Si se requiere una autorizacin previa para que su compaa de seguros cubra su medicamento, por favor permtanos de 1 a 2 das hbiles para completar este proceso.  Los precios de los medicamentos varan con frecuencia dependiendo del environmental consultant de dnde se surte la receta y alguna farmacias pueden ofrecer precios ms baratos.  El sitio web www.goodrx.com tiene cupones para medicamentos de health and safety inspector. Los precios aqu no tienen en cuenta lo que podra costar con la ayuda del seguro (puede ser ms barato con su seguro), pero el sitio web puede darle el precio si no utiliz tourist information centre manager.  - Puede imprimir el cupn correspondiente y llevarlo con su receta a la farmacia.  - Tambin puede pasar por landry oficina durante  el horario de atencin regular y education officer, museum una tarjeta de cupones de GoodRx.  - Si necesita que su receta se enve electrnicamente a una farmacia diferente, informe a nuestra oficina a travs de MyChart de [place] o por telfono llamando al [phone] y presione la opcin 4.

## 2024-08-04 ENCOUNTER — Emergency Department (HOSPITAL_COMMUNITY)
Admission: EM | Admit: 2024-08-04 | Discharge: 2024-08-04 | Disposition: A | Discharge status: Home / Self Care | Attending: Emergency Medicine | Admitting: Emergency Medicine

## 2024-08-04 ENCOUNTER — Emergency Department (HOSPITAL_COMMUNITY)

## 2024-08-04 ENCOUNTER — Other Ambulatory Visit: Payer: Self-pay

## 2024-08-04 ENCOUNTER — Encounter: Admitting: Neurosurgery

## 2024-08-04 ENCOUNTER — Encounter (HOSPITAL_COMMUNITY): Payer: Self-pay | Admitting: Emergency Medicine

## 2024-08-04 DIAGNOSIS — J45909 Unspecified asthma, uncomplicated: Secondary | ICD-10-CM | POA: Insufficient documentation

## 2024-08-04 DIAGNOSIS — Z853 Personal history of malignant neoplasm of breast: Secondary | ICD-10-CM | POA: Insufficient documentation

## 2024-08-04 DIAGNOSIS — E039 Hypothyroidism, unspecified: Secondary | ICD-10-CM | POA: Insufficient documentation

## 2024-08-04 DIAGNOSIS — Z8541 Personal history of malignant neoplasm of cervix uteri: Secondary | ICD-10-CM | POA: Diagnosis not present

## 2024-08-04 DIAGNOSIS — W010XXA Fall on same level from slipping, tripping and stumbling without subsequent striking against object, initial encounter: Secondary | ICD-10-CM | POA: Insufficient documentation

## 2024-08-04 DIAGNOSIS — Z91040 Latex allergy status: Secondary | ICD-10-CM | POA: Diagnosis not present

## 2024-08-04 DIAGNOSIS — Z96642 Presence of left artificial hip joint: Secondary | ICD-10-CM | POA: Insufficient documentation

## 2024-08-04 DIAGNOSIS — N182 Chronic kidney disease, stage 2 (mild): Secondary | ICD-10-CM | POA: Insufficient documentation

## 2024-08-04 DIAGNOSIS — S82842A Displaced bimalleolar fracture of left lower leg, initial encounter for closed fracture: Secondary | ICD-10-CM | POA: Insufficient documentation

## 2024-08-04 DIAGNOSIS — Z79899 Other long term (current) drug therapy: Secondary | ICD-10-CM | POA: Insufficient documentation

## 2024-08-04 DIAGNOSIS — R4182 Altered mental status, unspecified: Secondary | ICD-10-CM | POA: Diagnosis present

## 2024-08-04 DIAGNOSIS — S99912A Unspecified injury of left ankle, initial encounter: Secondary | ICD-10-CM | POA: Diagnosis present

## 2024-08-04 SURGERY — OPEN REDUCTION INTERNAL FIXATION (ORIF) ANKLE FRACTURE
Anesthesia: General | Site: Ankle | Laterality: Left

## 2024-08-04 MED ORDER — FENTANYL CITRATE (PF) 50 MCG/ML IJ SOSY
50.0000 ug | PREFILLED_SYRINGE | Freq: Once | INTRAMUSCULAR | Status: AC
  Administered 2024-08-04: 50 ug via INTRAVENOUS
  Filled 2024-08-04: qty 1

## 2024-08-04 MED ORDER — CEFAZOLIN SODIUM 1 G IM: 1.0000 g | Freq: Once | INTRAMUSCULAR | Status: DC

## 2024-08-04 MED ORDER — HYDROMORPHONE HCL 1 MG/ML IJ SOLN
0.5000 mg | Freq: Once | INTRAMUSCULAR | Status: AC
  Administered 2024-08-04: 0.5 mg via INTRAVENOUS
  Filled 2024-08-04: qty 1

## 2024-08-04 MED ORDER — HYDROCODONE-ACETAMINOPHEN 5-325 MG PO TABS: 1.0000 | ORAL_TABLET | ORAL | 0 refills | Status: AC | PRN

## 2024-08-04 MED ORDER — CEFAZOLIN SODIUM-DEXTROSE 2-4 GM/100ML-% IV SOLN
2.0000 g | Freq: Once | INTRAVENOUS | Status: AC
  Administered 2024-08-04: 2 g via INTRAVENOUS
  Filled 2024-08-04: qty 100

## 2024-08-04 MED ORDER — ONDANSETRON HCL 4 MG/2ML IJ SOLN
4.0000 mg | Freq: Once | INTRAMUSCULAR | Status: AC
  Administered 2024-08-04: 4 mg via INTRAVENOUS
  Filled 2024-08-04: qty 2

## 2024-08-04 MED ORDER — PROPOFOL 10 MG/ML IV BOLUS
1.0000 mg/kg | Freq: Once | INTRAVENOUS | Status: AC
  Administered 2024-08-04: 50 mg via INTRAVENOUS
  Filled 2024-08-04: qty 20

## 2024-08-04 NOTE — ED Notes (Signed)
 Pt in bed, pt states that she is ready to go home, pt states that she doesn't want crutches, states that she has crutches and a walker at home. MD notified or to d/c without crutches.

## 2024-08-04 NOTE — ED Triage Notes (Signed)
 Pt bib ems c/o left ankle pain/injury. Pt's father woke up to her screaming, pt states that she either fell in the bathroom or in the hall. Per ems pt has been dazed and not responding to their questions, per father states that she gets like this when she takes her meds and is drowsy. C-collar was placed and removed by ems  50mcg fentanyl  IV with ems

## 2024-08-04 NOTE — Progress Notes (Addendum)
 Orthopedic Tech Progress Note Patient Details:  Vanessa Medina 1971-02-17 [id number]  Ortho Devices Type of Ortho Device: Post (short leg) splint, Stirrup splint Ortho Device/Splint Location: lle Ortho Device/Splint Interventions: Ordered, Application, Adjustment  We removed and reapplied a new splint to better position the leg. The dr molded the splint. Post Interventions Patient Tolerated: Well Instructions Provided: Care of device, Adjustment of device  Chandra Dorn JINNY 08/04/2024, 5:54 AM

## 2024-08-04 NOTE — Progress Notes (Signed)
 Orthopedic Tech Progress Note Patient Details:  Vanessa Medina 1971/07/30 [id number]  Ortho Devices Type of Ortho Device: Post (short leg) splint, Stirrup splint Ortho Device/Splint Location: lle Ortho Device/Splint Interventions: Ordered, Application, Adjustment  I applied splint with the drs assist. Post Interventions Patient Tolerated: Well Instructions Provided: Care of device, Adjustment of device  Chandra Dorn JINNY 08/04/2024, 5:51 AM

## 2024-08-04 NOTE — Consult Note (Signed)
 Reason for Consult:Left ankle fx Referring Physician: Medford Tegeler Time called: 0730 Time at bedside: 0956   LIS SAVITT is an 53 y.o. female.  HPI: Philomina slipped and fell at home last night. She had immediate left ankle pain and could not get up or bear weight. She was brought to the ED where x-rays showed a left ankle fx. Orthopedic surgery was consulted. An attempt was made at reduction by the EDP but was inadequate. She lives at home with her father and uses a RW to ambulate. She does not work.  Past Medical History:  Diagnosis Date  . Anemia   . Anesthesia complication    a.) PONV; b.) delayed emergence  . Anxiety   . Asthma   . Benign cyst of kidney   . Breast cancer (HCC) 01/18/2023   left breast-bil mastectomies  . Cervical cancer (HCC)   . Chronic back pain   . Chronic hypotension   . CKD (chronic kidney disease), stage II   . Claustrophobia   . Decompression injury of spinal cord   . Depression   . Dizziness   . Family history of adverse reaction to anesthesia    a.) delayed emergence in 1st degree female relative (mother)  . History of dysplastic nevus 01/18/2018   right shoulder, recurrent dysplastic nevus  . History of kidney stones   . History of shingles   . Hypercholesteremia   . Hypothyroidism   . Inappropriate sinus node tachycardia   . Migraine   . Morbid obesity (HCC)   . Multiple allergies   . Neurogenic orthostatic hypotension (HCC)   . Obesity   . Osteoarthritis    left hip  . Pneumonia   . PONV (postoperative nausea and vomiting)   . S/P insertion of spinal cord stimulator 01/24/2024  . Seizure (HCC) 2021   x1-after a back surgery with spinal cord stimulator  . 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 Wears glasses     Past Surgical History:  Procedure Laterality Date  . ABDOMINAL HYSTERECTOMY    . APPENDECTOMY    . BACK SURGERY    . BILIOPANCREATIC DIVISION W DUODENAL SWITCH N/A   . BREAST  BIOPSY Left 12/06/2022   u/s bx,1:00 heart clip, path pending  . BREAST BIOPSY Left 12/06/2022   us  bx 2:00 ribbon clip path pending  . BREAST BIOPSY Left 12/06/2022   US  LT BREAST BX W LOC DEV EA ADD LESION IMG BX SPEC US  GUIDE 12/06/2022 [facility]  . BREAST BIOPSY Left 12/06/2022   US  LT BREAST BX W LOC DEV 1ST LESION IMG BX SPEC US  GUIDE 12/06/2022 [facility]  . BREAST RECONSTRUCTION WITH PLACEMENT OF TISSUE EXPANDER AND FLEX HD (ACELLULAR HYDRATED DERMIS) Bilateral 01/18/2023   Procedure: IMMEDIATE LEFT BREAST RECONSTRUCTION WITH PLACEMENT OF TISSUE EXPANDER AND FLEX HD (ACELLULAR HYDRATED DERMIS);  Surgeon: [name], DO;  Location: [facility];  Service: Plastics;  Laterality: Bilateral;  . BREAST RECONSTRUCTION WITH PLACEMENT OF TISSUE EXPANDER AND FLEX HD (ACELLULAR HYDRATED DERMIS) Left 03/26/2023   Procedure: BREAST RECONSTRUCTION WITH PLACEMENT OF TISSUE EXPANDER;  Surgeon: [name], DO;  Location: [facility];  Service: Plastics;  Laterality: Left;  . CHOLECYSTECTOMY    . COLONOSCOPY N/A 06/27/2021   Procedure: COLONOSCOPY;  Surgeon: Onita Elspeth Sharper, DO;  Location: [facility];  Service: Gastroenterology;  Laterality: N/A;  . COLONOSCOPY N/A 08/27/2023   Procedure: COLONOSCOPY;  Surgeon: Onita Elspeth Sharper, DO;  Location: [facility];  Service: Gastroenterology;  Laterality: N/A;  . DIAGNOSTIC LAPAROSCOPY     LOA  . ESOPHAGOGASTRODUODENOSCOPY N/A 06/27/2021   Procedure: ESOPHAGOGASTRODUODENOSCOPY (EGD);  Surgeon: Onita Elspeth Sharper, DO;  Location: [facility];  Service: Gastroenterology;  Laterality: N/A;  . ESOPHAGOGASTRODUODENOSCOPY (EGD) WITH PROPOFOL  N/A 07/25/2017   Procedure: ESOPHAGOGASTRODUODENOSCOPY (EGD) WITH PROPOFOL ;  Surgeon: Viktoria Lamar DASEN, MD;  Location: [facility];  Service: Endoscopy;  Laterality: N/A;  . HERNIA REPAIR    . KNEE ARTHROSCOPY Right   . LAPAROSCOPIC GASTRIC SLEEVE RESECTION WITH  HIATAL HERNIA REPAIR    . LIPOSUCTION WITH LIPOFILLING Bilateral 05/21/2024   Procedure: LIPOSUCTION, WITH FAT TRANSFER;  Surgeon: [name], DO;  Location: [facility];  Service: Plastics;  Laterality: Bilateral;  Bilateral breast fat grafting  . LUMBAR FUSION  08/29/2022   Revision Lumbar two through five Laminectomy & Fusion with Transforaminal Lumbar interbody fusion  . LUMBAR SPINAL CORD STIMULATOR REVISION Left 06/26/2024   Procedure: Left sided internal pulse generator site revision;  Surgeon: [name] ORN, MD;  Location: [facility];  Service: Neurosurgery;  Laterality: Left;  SABRA MASTECTOMY W/ SENTINEL NODE BIOPSY Bilateral 01/18/2023   Procedure: MASTECTOMY WITH SENTINEL LYMPH NODE BIOPSY, RNFA to assist;  Surgeon: Jordis Laneta FALCON, MD;  Location: [facility];  Service: General;  Laterality: Bilateral;  . PULSE GENERATOR IMPLANT N/A 01/24/2024   Procedure: UNILATERAL PULSE GENERATOR IMPLANT;  Surgeon: [name] ORN, MD;  Location: [facility];  Service: Neurosurgery;  Laterality: N/A;  INTERNAL PULSE GENERATOR PLACEMENT FOR SPINAL CORD STIMULATOR  . REMOVAL OF TISSUE EXPANDER AND PLACEMENT OF IMPLANT Bilateral 06/06/2023   Procedure: REMOVAL OF TISSUE EXPANDER AND PLACEMENT OF IMPLANT;  Surgeon: [name], DO;  Location: [facility];  Service: Plastics;  Laterality: Bilateral;  . SPINAL CORD STIMULATOR IMPLANT    . THORACIC LAMINECTOMY FOR SPINAL CORD STIMULATOR N/A 01/15/2024   Procedure: THORACIC LAMINECTOMY FOR SPINAL CORD STIMULATOR;  Surgeon: [name] ORN, MD;  Location: [facility];  Service: Neurosurgery;  Laterality: N/A;  THORACIC LAMINECTOMY FOR SPINAL CORD STIMULATOR PADDLE TRIAL  . TONSILLECTOMY    . TOTAL HIP ARTHROPLASTY Left 01/26/2016   Procedure: LEFT TOTAL HIP ARTHROPLASTY ANTERIOR APPROACH;  Surgeon: Kay CHRISTELLA Cummins, MD;  Location: [facility];  Service: Orthopedics;  Laterality: Left;  . TRANSFORAMINAL LUMBAR INTERBODY FUSION (TLIF)  WITH PEDICLE SCREW FIXATION 3 LEVEL  08/2019   Lynwood Better, MD L2-L5    Family History  Problem Relation Age of Onset  . Renal Disease Mother   . Hypertension Mother   . Sudden Cardiac Death Mother   . Heart failure Mother   . Valvular heart disease Mother   . Heart disease Mother   . Heart disease Father        Just had stents placed  . Stroke Brother   . Heart attack Brother 46  . Breast cancer Maternal Grandmother   . Diabetes Other     Social History:  reports that she has never smoked. She has never been exposed to tobacco smoke. She has never used smokeless tobacco. She reports current alcohol use. She reports that she does not use drugs.  Allergies:  Allergies  Allergen Reactions  . Bee Venom Swelling  . Iodinated Contrast Media Anaphylaxis    Swelling  Other reaction(s): Other (See Comments)  Swelling   Swelling  . Latex Hives and Rash  . Morphine  Other (See Comments)    BRADYCARDIA  . Shellfish Allergy  Anaphylaxis  . Codeine  Nausea And Vomiting  . Meperidine Hcl Other (See Comments)    BRADYCARDIA  . Bee Pollen Other (See Comments)    Unknown  . Oxycodone  Hives and Itching    Blisters on back  . Tape Hives    Adhesive  . Pentazocine Lactate Nausea And Vomiting    REACTION: vomiting with Talwin NX  . Propoxyphene Nausea Only and Nausea And Vomiting    Medications: I have reviewed the patient's current medications.  No results found. However, due to the size of the patient record, not all encounters were searched. Please check Results Review for a complete set of results.  DG Ankle Left Port Result Date: 08/04/2024 EXAM: 1 or 2 VIEW(S) XRAY OF THE ANKLE 08/04/2024 05:57:00 AM CLINICAL HISTORY: Fracture 03948 COMPARISON: 08/04/2024 FINDINGS: BONES AND JOINTS: Cast in place. Persistent displaced distal fibular fracture. Similar appearance of medial malleolar fracture. Persistent lateral talar subluxation. SOFT TISSUES: Soft tissue edema. IMPRESSION: 1.  Persistent displaced distal fibular fracture and medial malleolar fracture. 2. Persistent lateral talar subluxation. 3. Soft tissue edema. Electronically signed by: Waddell Calk MD 08/04/2024 06:06 AM EST RP Workstation: [id number]   DG Ankle Left Port Result Date: 08/04/2024 EXAM: 1 or 2 VIEW(S) XRAY OF THE LEFT ANKLE 08/04/2024 04:38:00 AM CLINICAL HISTORY: 03948 Fracture 03948 Fracture 96051 COMPARISON: Left ankle series dated 08/04/2024. FINDINGS: BONES AND JOINTS: Since the previous study, there is worsened lateral subluxation of the tibiotalar joint and there is worsening lateral displacement of the lateral malleolus. The gap between the medial malleolus and talar dome has increased from 11 mm to 19 mm and lateral displacement of the distal fibular fragment has worsened from 6 mm to 11 mm. Comminuted fracture of the medial malleolus is again demonstrated. SOFT TISSUES: A splint has been placed in the interim. IMPRESSION: 1. Worsened lateral subluxation of the tibiotalar joint and lateral displacement of the lateral malleolus. 2. Comminuted fracture of the medial malleolus. Electronically signed by: Evalene Coho MD 08/04/2024 05:12 AM EST RP Workstation: [id number]   CT CERVICAL SPINE WO CONTRAST Result Date: 08/04/2024 EXAM: CT CERVICAL SPINE WITHOUT CONTRAST 08/04/2024 03:56:00 AM TECHNIQUE: CT of the cervical spine was performed without the administration of intravenous contrast. Multiplanar reformatted images are provided for review. Automated exposure control, iterative reconstruction, and/or weight based adjustment of the mA/kV was utilized to reduce the radiation dose to as low as reasonably achievable. COMPARISON: None available. CLINICAL HISTORY: Neck trauma, intoxicated or obtunded (Age >= 16y) FINDINGS: CERVICAL SPINE: BONES AND ALIGNMENT: No acute fracture or traumatic malalignment. DEGENERATIVE CHANGES: No significant degenerative changes. SOFT TISSUES: No prevertebral soft tissue  swelling. IMPRESSION: 1. No acute abnormality of the cervical spine. Electronically signed by: Evalene Coho MD 08/04/2024 04:27 AM EST RP Workstation: [id number]   CT HEAD WO CONTRAST ( ) Result Date: 08/04/2024 EXAM: CT HEAD WITHOUT 08/04/2024 03:56:00 AM TECHNIQUE: CT of the head was performed without the administration of intravenous contrast. Automated exposure control, iterative reconstruction, and/or weight based adjustment of the mA/kV was utilized to reduce the radiation dose to as low as reasonably achievable. COMPARISON: MRI of the head dated 04/04/2022. CLINICAL HISTORY: Head trauma, abnormal mental status (Age 78-64y). FINDINGS: BRAIN AND VENTRICLES: No acute intracranial hemorrhage. No mass effect or midline shift. No extra-axial fluid collection. No evidence of acute infarct. No hydrocephalus. Partially empty sella. ORBITS: No acute abnormality. Bilateral lens replacement. SINUSES AND MASTOIDS: No acute abnormality. SOFT TISSUES AND SKULL: No acute skull fracture. No acute soft tissue abnormality.  IMPRESSION: 1. No acute intracranial abnormality related to head trauma or abnormal mental status. Electronically signed by: Evalene Coho MD 08/04/2024 04:25 AM EST RP Workstation: [id number]   DG Ankle Complete Left Result Date: 08/04/2024 EXAM: 3 OR MORE VIEW(S) XRAY OF THE LEFT ANKLE 08/04/2024 03:33:32 AM CLINICAL HISTORY: fall COMPARISON: None available. FINDINGS: BONES AND JOINTS: Oblique distal fibular fracture at level of tibial plafond with lateral displacement of the distal fracture fragment. Transverse medial malleolus fracture. Calcaneal spurs. Lateral subluxation of the talus with respect to the tibia. SOFT TISSUES: Soft tissue swelling. IMPRESSION: 1. Bimalleolar fracture or dislocation of the left ankle. Electronically signed by: Norman Gatlin MD 08/04/2024 03:40 AM EST RP Workstation: [id number]    Review of Systems  HENT:  Negative for ear discharge, ear pain, hearing  loss and tinnitus.   Eyes:  Negative for photophobia and pain.  Respiratory:  Negative for cough and shortness of breath.   Cardiovascular:  Negative for chest pain.  Gastrointestinal:  Negative for abdominal pain, nausea and vomiting.  Genitourinary:  Negative for dysuria, flank pain, frequency and urgency.  Musculoskeletal:  Positive for arthralgias (Left ankle). Negative for back pain, myalgias and neck pain.  Neurological:  Negative for dizziness and headaches.  Hematological:  Does not bruise/bleed easily.  Psychiatric/Behavioral:  The patient is not nervous/anxious.    Blood pressure 132/72, pulse 84, temperature 98.4 F (36.9 C), temperature source Oral, resp. rate 12, SpO2 100%. Physical Exam Constitutional:      General: She is not in acute distress.    Appearance: She is well-developed. She is not diaphoretic.  HENT:     Head: Normocephalic and atraumatic.  Eyes:     General: No scleral icterus.       Right eye: No discharge.        Left eye: No discharge.     Conjunctiva/sclera: Conjunctivae normal.  Cardiovascular:     Rate and Rhythm: Normal rate and regular rhythm.  Pulmonary:     Effort: Pulmonary effort is normal. No respiratory distress.  Musculoskeletal:     Cervical back: Normal range of motion.     Comments: LLE No traumatic wounds, ecchymosis, or rash  Short leg splint in place  No knee effusion  Knee stable to varus/ valgus and anterior/posterior stress  Sens DPN, SPN, TN intact  Motor EH 5/5  Toes perfused, No significant edema  Skin:    General: Skin is warm and dry.  Neurological:     Mental Status: She is alert.  Psychiatric:        Mood and Affect: Mood normal.        Behavior: Behavior normal.     Assessment/Plan: Left ankle fx -- Plan repeat CR today. If successful can d/c home with NWB LLE and f/u with Dr. Jerri this week to discuss surgery.    Ozell DOROTHA Ned, PA-C Orthopedic Surgery [phone] 08/04/2024, 10:05 AM

## 2024-08-04 NOTE — ED Notes (Addendum)
 Pt in bed, pt awake and oriented, pt returned to baseline, family at bedside, pt states that she is ready to go home, read and reviewed d/c instructions and follow up, pt verbalized understanding d/c and follow up, paper pants given, cms intact, positive sensation to touch and less than three sec cap refill.  pt from department via wheel chair.

## 2024-08-04 NOTE — ED Provider Notes (Signed)
 Care assumed from Dr. Haze.  At time of transfer of care, initial plan is for the patient to go to the operating room with orthopedics as the patient's ankle fracture would not reduce with multiple times in the emergency department by the overnight team.  Ozell Purchase with orthopedics reached out that orthopedics would prefer 1 more attempt with sedation here with them attempting to reduce it with procedural sedation.  I spoke to patient and she agrees.  Will get set up for procedural sedation with propofol  and coordinate with orthopedics to be able to attempt reduction.  If it is reduced appropriately, patient may be able to not have to go to the operating room today per orthopedics.  .Sedation  Date/Time: 08/04/2024 12:14 PM  Performed by: Ginger Lonni PARAS, MD Authorized by: Ginger Lonni PARAS, MD   Consent:    Consent obtained:  Verbal and written   Consent given by:  Patient   Alternatives discussed:  Analgesia without sedation Universal protocol:    Immediately prior to procedure, a time out was called: yes     Patient identity confirmed:  Anonymous protocol, patient vented/unresponsive and arm band Indications:    Procedure performed:  Fracture reduction   Procedure necessitating sedation performed by:  Different physician Pre-sedation assessment:    Time since last food or drink:  Hours   ASA classification: class 2 - patient with mild systemic disease     Mallampati score:  I - soft palate, uvula, fauces, pillars visible   Pre-sedation assessments completed and reviewed: pre-procedure airway patency not reviewed, pre-procedure cardiovascular function not reviewed, pre-procedure hydration status not reviewed, pre-procedure mental status not reviewed, pre-procedure nausea and vomiting status not reviewed, pre-procedure pain level not reviewed, pre-procedure respiratory function not reviewed and pre-procedure temperature not reviewed   A pre-sedation assessment was  completed prior to the start of the procedure Procedure details (see MAR for exact dosages):    Sedation:  Propofol    Intended level of sedation: deep   Total Provider sedation time (minutes):  20 Post-procedure details:   A post-sedation assessment was completed following the completion of the procedure.   Patient is stable for discharge or admission: yes     Procedure completion:  Tolerated well, no immediate complications   X-ray after orthopedic reduction was adequate and orthopedics says she can be discharged for outpatient follow-up.  Will send in a prescription for pain medicine and she reports she cannot tolerate hydrocodone .  Will Sorin and prescription for Norco and she will follow-up with Dr. Jerri as an outpatient.  Clinical Impression: 1. Closed bimalleolar fracture of left ankle, initial encounter     Disposition: Discharge  Condition: Good  I have discussed the results, Dx and Tx plan with the pt(& family if present). He/she/they expressed understanding and agree(s) with the plan. Discharge instructions discussed at great length. Strict return precautions discussed and pt &/or family have verbalized understanding of the instructions. No further questions at time of discharge.    New Prescriptions   HYDROCODONE -ACETAMINOPHEN  (NORCO/VICODIN) 5-325 MG TABLET    Take 1 tablet by mouth every 4 (four) hours as needed.    Follow Up: Jerri Kay HERO, MD [address] [place]  [place] [address] [phone]     Valora Lynwood FALCON, MD [address] [place] [address] [phone]     [facility] Emergency Department at [facility] [address] [place]  [address] [phone]       Sue Mcalexander, Lonni  J, MD 08/04/24 1323

## 2024-08-04 NOTE — Discharge Instructions (Signed)
 Your history, exam, workup today revealed left ankle fracture.  We are able to do a procedural sedation at the orthopedics was able to reduce it to an adequate degree and recommend you remain nonweightbearing on it until he can see Dr. Jerri with orthopedics in clinic and will likely need outpatient surgery for it.  Please try to keep it elevated and rest.  Please use the pain medicine to help with symptoms.  If any symptoms change or worsen acutely, please return to the nearest emergency department.

## 2024-08-04 NOTE — ED Notes (Signed)
 Pt has positive sensation to touch and less than three sec cap refill in toes on L foot. Pt reports 7/10 pain, pt requests more pain med, md notified

## 2024-08-04 NOTE — Progress Notes (Signed)
 RT present for conscious sedation procedure.  Patient had been placed on end-tidal CO2 prior to RT arrival.  Patient tolerated procedure well with no respiratory interventions needed.

## 2024-08-04 NOTE — ED Provider Notes (Signed)
 [place] EMERGENCY DEPARTMENT AT [facility] Provider Note   CSN: [id number] Arrival date & time: 08/04/24  9693     Patient presents with: Ankle Pain   Vanessa Medina is a 53 y.o. female.   Brought to the emergency department from home after her ankle injury.  Patient fell while going to the bathroom, injured her ankle.       Prior to Admission medications   Medication Sig Start Date End Date Taking? Authorizing Provider  ALPRAZolam  (XANAX ) 0.25 MG tablet Take 0.25 mg by mouth at bedtime as needed for sleep. 11/09/23   [provider]  azelastine (ASTELIN) 0.1 % nasal spray Place 1 spray into both nostrils 2 (two) times daily as needed for allergies. Use in each nostril as directed    [provider]  baclofen  (LIORESAL ) 10 MG tablet Take 1 tablet (10 mg total) by mouth 3 (three) times daily. - for spasms 11/30/23   Lovorn, Megan, MD  Calcium  Carb-Cholecalciferol (CALCIUM +D3 PO) Take 1 tablet by mouth in the morning and at bedtime.    [provider]  cetirizine  (ZYRTEC  ALLERGY) 10 MG tablet Take 1 tablet (10 mg total) by mouth daily for 14 days. 07/06/24 07/30/24  Corlis Burnard DEL, NP  clobetasol  ointment (TEMOVATE ) 0.05 % Apply 1 gram topically to affected area of skin twice daily. Stop once resolved and restart as needed for flares. Avoid use on face, armpits, groin unless otherwise indicated. 07/30/24   Raymund Lauraine BROCKS, MD  colestipol (COLESTID) 1 g tablet Take 1 g by mouth at bedtime. 08/30/23 08/29/24  [provider]  diclofenac  Sodium (VOLTAREN ) 1 % GEL Apply 2 g topically 4 (four) times daily as needed (pain). 02/04/24   Jerri Kay HERO, MD  docusate sodium  (COLACE) 100 MG capsule Take 1 capsule (100 mg total) by mouth 2 (two) times daily. 06/26/24   Ulis Bottcher, PA-C  DULoxetine  (CYMBALTA ) 60 MG capsule Take 2 capsules (120 mg total) by mouth at bedtime. 08/22/23   Lovorn, Megan, MD  Dupilumab  (DUPIXENT ) 300 MG/2ML SOAJ Inject 300 mg  into the skin every 14 (fourteen) days. Starting at day 15 for maintenance. 07/30/24   Raymund Lauraine BROCKS, MD  EPINEPHrine  0.3 mg/0.3 mL IJ SOAJ injection Inject 0.3 mg into the muscle as needed for anaphylaxis. 03/22/21   [provider]  fluticasone  (FLONASE ) 50 MCG/ACT nasal spray Place 2 sprays into both nostrils daily as needed for allergies.    [provider]  furosemide (LASIX) 20 MG tablet Take 20 mg by mouth at bedtime as needed for fluid or edema. 08/30/23   [provider]  gabapentin  (NEURONTIN ) 300 MG capsule Take 1 capsule (300 mg total) by mouth at bedtime. For nerve pain 11/30/23   Lovorn, Megan, MD  HYDROcodone -acetaminophen  (NORCO/VICODIN) 5-325 MG tablet Take 1 tablet by mouth every 6 (six) hours as needed for moderate pain (pain score 4-6). 06/26/24   Ulis Bottcher, PA-C  hydrOXYzine  (ATARAX ) 10 MG tablet Take 1 tablet (10 mg total) by mouth 3 (three) times daily as needed. 01/18/24   Ulis Bottcher, PA-C  letrozole  (FEMARA ) 2.5 MG tablet Take 2.5 mg by mouth daily. 12/28/23   [provider]  levothyroxine (SYNTHROID) 25 MCG tablet Take 25 mcg by mouth daily before breakfast. 04/18/24 04/18/25  [provider]  lidocaine  (LIDODERM ) 5 % Place 1 patch onto the skin daily for 18 days. Remove & Discard patch within 12 hours or as directed by MD 06/12/24 07/30/24  [name] ORN, MD  meloxicam  (MOBIC ) 7.5 MG tablet TAKE 1-2 TABLETS EVERY DAY WITH FOOD. 05/19/24   Gregory Edsel Ruth, PA  methocarbamol  (ROBAXIN ) 500 MG tablet Take 1 tablet (500 mg total) by mouth every 8 (eight) hours as needed for muscle spasms. 02/22/24   Ulis Bottcher, PA-C  montelukast  (SINGULAIR ) 10 MG tablet TAKE 1 TABLET BY MOUTH AT  BEDTIME FOR ALLERGIES 01/04/23   Lovorn, Megan, MD  Multiple Vitamin (MULTIVITAMIN WITH MINERALS) TABS tablet Take 1 tablet by mouth 2 (two) times daily. 08/31/20   Love, Sharlet RAMAN, PA-C  mupirocin  ointment (BACTROBAN ) 2 % Apply 1 Application  topically 2 (two) times daily. 07/06/24   Corlis Burnard DEL, NP  pramipexole  (MIRAPEX ) 0.125 MG tablet Take 0.5 mg by mouth at bedtime. 07/31/17   [provider]  predniSONE  (STERAPRED UNI-PAK 21 TAB) 10 MG (21) TBPK tablet Take by mouth daily. As directed 07/06/24   Corlis Burnard DEL, NP  pyridOXINE (VITAMIN B-6) 100 MG tablet Take 1 tablet (100 mg total) by mouth daily. 09/10/20   Ganji, Jay, MD  thiamine 250 MG tablet Take 250 mg by mouth at bedtime.    [provider]  tolterodine  (DETROL  LA) 4 MG 24 hr capsule Take 1 capsule (4 mg total) by mouth every evening. 03/18/24   Jacobo Evalene PARAS, MD  topiramate  (TOPAMAX ) 100 MG tablet Take 1 tablet (100 mg total) by mouth 2 (two) times daily. Take 1 tab BID- but if you cannot tolerate, take 200 mg at bedtime- for headache prevention 06/20/24   Lovorn, Megan, MD  vitamin B-12 (CYANOCOBALAMIN ) 1000 MCG tablet Take 1 tablet (1,000 mcg total) by mouth daily. 09/10/20   Ladona Heinz, MD    Allergies: Bee venom, Iodinated contrast media, Latex, Morphine , Shellfish allergy, Codeine , Meperidine hcl, Bee pollen, Oxycodone , Tape, Pentazocine lactate, and Propoxyphene    Review of Systems  Updated Vital Signs BP 130/85   Pulse 81   Temp 97.8 F (36.6 C)   Resp 18   SpO2 100%   Physical Exam Vitals and nursing note reviewed.  Constitutional:      General: She is not in acute distress.    Appearance: She is well-developed.  HENT:     Head: Normocephalic and atraumatic.     Mouth/Throat:     Mouth: Mucous membranes are moist.  Eyes:     General: Vision grossly intact. Gaze aligned appropriately.     Extraocular Movements: Extraocular movements intact.     Conjunctiva/sclera: Conjunctivae normal.  Cardiovascular:     Rate and Rhythm: Normal rate and regular rhythm.     Pulses: Normal pulses.     Heart sounds: Normal heart sounds, S1 normal and S2 normal. No murmur heard.    No friction rub. No gallop.  Pulmonary:     Effort: Pulmonary  effort is normal. No respiratory distress.     Breath sounds: Normal breath sounds.  Abdominal:     General: Bowel sounds are normal.     Palpations: Abdomen is soft.     Tenderness: There is no abdominal tenderness. There is no guarding or rebound.     Hernia: No hernia is present.  Musculoskeletal:        General: No swelling.     Cervical back: Full passive range of motion without pain, normal range of motion and neck supple. No spinous process tenderness or muscular tenderness. Normal range of motion.     Right lower leg: No edema.  Left lower leg: No edema.     Left ankle: Swelling present. No deformity or ecchymosis.  Skin:    General: Skin is warm and dry.     Capillary Refill: Capillary refill takes less than 2 seconds.     Findings: Wound (medial malleolus - apparent superficial abrasion) present. No ecchymosis, erythema or rash.  Neurological:     General: No focal deficit present.     GCS: GCS verbal subscore is 5.     Cranial Nerves: Cranial nerves 2-12 are intact.     Sensory: Sensation is intact.     Motor: Motor function is intact.     Coordination: Coordination is intact.  Psychiatric:        Attention and Perception: Attention normal.        Mood and Affect: Mood normal.        Speech: Speech normal.        Behavior: Behavior normal.     (all labs ordered are listed, but only abnormal results are displayed) Labs Reviewed - No data to display  EKG: None  Radiology: DG Ankle Left Port Result Date: 08/04/2024 EXAM: 1 or 2 VIEW(S) XRAY OF THE ANKLE 08/04/2024 05:57:00 AM CLINICAL HISTORY: Fracture 03948 COMPARISON: 08/04/2024 FINDINGS: BONES AND JOINTS: Cast in place. Persistent displaced distal fibular fracture. Similar appearance of medial malleolar fracture. Persistent lateral talar subluxation. SOFT TISSUES: Soft tissue edema. IMPRESSION: 1. Persistent displaced distal fibular fracture and medial malleolar fracture. 2. Persistent lateral talar subluxation.  3. Soft tissue edema. Electronically signed by: Waddell Calk MD 08/04/2024 06:06 AM EST RP Workstation: [id number]   DG Ankle Left Port Result Date: 08/04/2024 EXAM: 1 or 2 VIEW(S) XRAY OF THE LEFT ANKLE 08/04/2024 04:38:00 AM CLINICAL HISTORY: 03948 Fracture 03948 Fracture 96051 COMPARISON: Left ankle series dated 08/04/2024. FINDINGS: BONES AND JOINTS: Since the previous study, there is worsened lateral subluxation of the tibiotalar joint and there is worsening lateral displacement of the lateral malleolus. The gap between the medial malleolus and talar dome has increased from 11 mm to 19 mm and lateral displacement of the distal fibular fragment has worsened from 6 mm to 11 mm. Comminuted fracture of the medial malleolus is again demonstrated. SOFT TISSUES: A splint has been placed in the interim. IMPRESSION: 1. Worsened lateral subluxation of the tibiotalar joint and lateral displacement of the lateral malleolus. 2. Comminuted fracture of the medial malleolus. Electronically signed by: Evalene Coho MD 08/04/2024 05:12 AM EST RP Workstation: [id number]   CT CERVICAL SPINE WO CONTRAST Result Date: 08/04/2024 EXAM: CT CERVICAL SPINE WITHOUT CONTRAST 08/04/2024 03:56:00 AM TECHNIQUE: CT of the cervical spine was performed without the administration of intravenous contrast. Multiplanar reformatted images are provided for review. Automated exposure control, iterative reconstruction, and/or weight based adjustment of the mA/kV was utilized to reduce the radiation dose to as low as reasonably achievable. COMPARISON: None available. CLINICAL HISTORY: Neck trauma, intoxicated or obtunded (Age >= 16y) FINDINGS: CERVICAL SPINE: BONES AND ALIGNMENT: No acute fracture or traumatic malalignment. DEGENERATIVE CHANGES: No significant degenerative changes. SOFT TISSUES: No prevertebral soft tissue swelling. IMPRESSION: 1. No acute abnormality of the cervical spine. Electronically signed by: Evalene Coho MD  08/04/2024 04:27 AM EST RP Workstation: [id number]   CT HEAD WO CONTRAST ( ) Result Date: 08/04/2024 EXAM: CT HEAD WITHOUT 08/04/2024 03:56:00 AM TECHNIQUE: CT of the head was performed without the administration of intravenous contrast. Automated exposure control, iterative reconstruction, and/or weight based adjustment of the mA/kV was utilized to reduce the radiation dose  to as low as reasonably achievable. COMPARISON: MRI of the head dated 04/04/2022. CLINICAL HISTORY: Head trauma, abnormal mental status (Age 22-64y). FINDINGS: BRAIN AND VENTRICLES: No acute intracranial hemorrhage. No mass effect or midline shift. No extra-axial fluid collection. No evidence of acute infarct. No hydrocephalus. Partially empty sella. ORBITS: No acute abnormality. Bilateral lens replacement. SINUSES AND MASTOIDS: No acute abnormality. SOFT TISSUES AND SKULL: No acute skull fracture. No acute soft tissue abnormality. IMPRESSION: 1. No acute intracranial abnormality related to head trauma or abnormal mental status. Electronically signed by: Evalene Coho MD 08/04/2024 04:25 AM EST RP Workstation: [id number]   DG Ankle Complete Left Result Date: 08/04/2024 EXAM: 3 OR MORE VIEW(S) XRAY OF THE LEFT ANKLE 08/04/2024 03:33:32 AM CLINICAL HISTORY: fall COMPARISON: None available. FINDINGS: BONES AND JOINTS: Oblique distal fibular fracture at level of tibial plafond with lateral displacement of the distal fracture fragment. Transverse medial malleolus fracture. Calcaneal spurs. Lateral subluxation of the talus with respect to the tibia. SOFT TISSUES: Soft tissue swelling. IMPRESSION: 1. Bimalleolar fracture or dislocation of the left ankle. Electronically signed by: Norman Gatlin MD 08/04/2024 03:40 AM EST RP Workstation: [id number]     .Ortho Injury Treatment  Date/Time: 08/04/2024 4:17 AM  Performed by: Haze Lonni PARAS, MD Authorized by: Haze Lonni PARAS, MD   Consent:    Consent obtained:  Verbal    Consent given by:  Patient   Risks discussed:  Restricted joint movement, stiffness and nerve damage Universal protocol:    Procedure explained and questions answered to patient or proxy's satisfaction: yes     Relevant documents present and verified: yes     Test results available and properly labeled: yes     Imaging studies available: yes     Required blood products, implants, devices, and special equipment available: yes     Site/side marked: yes     Immediately prior to procedure a time out was called: yes     Patient identity confirmed:  Verbally with patientInjury location: ankle Location details: left ankle Injury type: fracture-dislocation Fracture type: bimalleolar Pre-procedure neurovascular assessment: neurovascularly intact Pre-procedure distal perfusion: normal Pre-procedure neurological function: normal Pre-procedure range of motion: reduced  Anesthesia: Local anesthesia used: no  Patient sedated: NoManipulation performed: yes Skeletal traction used: yes Reduction successful: no Immobilization: splint Splint type: short leg and ankle stirrup Splint Applied by: Ortho Tech Supplies used: Ortho-Glass Post-procedure neurovascular assessment: post-procedure neurovascularly intact Post-procedure distal perfusion: normal Post-procedure neurological function: normal Post-procedure range of motion: unchanged Comments: Post-reduction xray shows significant subluxation. Splint removed and joint had obviously moved after splint placed.   [facility] Injury Treatment  Date/Time: 08/04/2024 5:36 AM  Performed by: Haze Lonni PARAS, MD Authorized by: Haze Lonni PARAS, MD   Consent:    Consent obtained:  Verbal   Consent given by:  Patient   Risks discussed:  Restricted joint movement, stiffness, nerve damage and recurrent dislocation Universal protocol:    Procedure explained and questions answered to patient or proxy's satisfaction: yes     Relevant documents  present and verified: yes     Test results available and properly labeled: yes     Imaging studies available: yes     Required blood products, implants, devices, and special equipment available: yes     Site/side marked: yes     Immediately prior to procedure a time out was called: yes     Patient identity confirmed:  Verbally with patientInjury location: ankle Injury type: fracture-dislocation Fracture type: bimalleolar Pre-procedure neurovascular assessment:  neurovascularly intact Pre-procedure distal perfusion: normal Pre-procedure neurological function: normal Pre-procedure range of motion: reduced  Anesthesia: Local anesthesia used: no  Patient sedated: NoManipulation performed: yes Skeletal traction used: yes Reduction successful: no Immobilization: splint Splint type: short leg and ankle stirrup Splint Applied by: Ortho Tech Supplies used: Ortho-Glass Post-procedure neurovascular assessment: post-procedure neurovascularly intact Post-procedure distal perfusion: normal Post-procedure neurological function: normal Post-procedure range of motion: improved      Medications Ordered in the ED  ceFAZolin  (ANCEF ) IVPB 2g/100 mL premix (0 g Intravenous Stopped 08/04/24 0600)  fentaNYL  (SUBLIMAZE ) injection 50 mcg (50 mcg Intravenous Given 08/04/24 0516)                                    Medical Decision Making Amount and/or Complexity of Data Reviewed Radiology: ordered.  Risk Prescription drug management.   Presented to the emergency department after ground-level fall.  Patient slipped at home attempting to walk to the bathroom.  Patient with ankle injury.  Swelling noted at arrival, no obvious deformity.  X-ray, however, does reveal bimalleolar fracture with subluxation.  CT head and cervical spine unremarkable.  Attempt to reduce the fracture was unsuccessful.  I did realign the ankle mortise prior to putting the splint on, but repeat x-ray showed significant  subluxation.  When this Ortho-Glass splint was removed, the joint had obviously gone back to its original position, there is significant laxity.  Splinting was repeated, this time with constant attention to alignment during the entire process of Ortho-Glass hardening.  Unfortunately, ankle joint is still not aligned appropriately.  Discussed with Dr. Jerri, on-call for orthopedics.  Patient will go to the OR today for fixation.     Final diagnoses:  Closed bimalleolar fracture of left ankle, initial encounter    ED Discharge Orders     None          Haze Lonni PARAS, MD 08/04/24 [phone]

## 2024-08-04 NOTE — Procedures (Signed)
 Procedure: Left ankle closed reduction   Indication: Left ankle fx   Surgeon: Ozell Ned, PA-C   Assist: None   Anesthesia: Propofol  via EDP   EBL: None   Complications: None   Findings: After risks/benefits explained patient desires to undergo procedure. Consent obtained and time out performed. Sedation given and efficacy confirmed. Ankle reduced and splinted. Pt tolerated the procedure well.       Ozell DOROTHA Ned, PA-C Orthopedic Surgery [phone]

## 2024-08-04 NOTE — ED Notes (Signed)
 Patient transported to CT

## 2024-08-04 NOTE — ED Notes (Signed)
 Resting comfortably on O2 [place] 2.5L, SPO2 100%. NSR 77. ETCO2 15. Arousable to voice, otherwise sleeping.

## 2024-08-12 ENCOUNTER — Ambulatory Visit: Admitting: Orthopaedic Surgery

## 2024-08-12 ENCOUNTER — Encounter (HOSPITAL_BASED_OUTPATIENT_CLINIC_OR_DEPARTMENT_OTHER): Payer: Self-pay | Admitting: Orthopaedic Surgery

## 2024-08-12 ENCOUNTER — Other Ambulatory Visit: Payer: Self-pay | Admitting: Physician Assistant

## 2024-08-12 ENCOUNTER — Other Ambulatory Visit: Payer: Self-pay

## 2024-08-12 ENCOUNTER — Telehealth: Payer: Self-pay | Admitting: Oncology

## 2024-08-12 DIAGNOSIS — S8262XA Displaced fracture of lateral malleolus of left fibula, initial encounter for closed fracture: Secondary | ICD-10-CM | POA: Insufficient documentation

## 2024-08-12 DIAGNOSIS — S93432A Sprain of tibiofibular ligament of left ankle, initial encounter: Secondary | ICD-10-CM | POA: Insufficient documentation

## 2024-08-12 MED ORDER — ASPIRIN 81 MG PO CHEW: 81.0000 mg | CHEWABLE_TABLET | Freq: Two times a day (BID) | ORAL | 0 refills | Status: DC

## 2024-08-12 MED ORDER — HYDROCODONE-ACETAMINOPHEN 5-325 MG PO TABS: 1.0000 | ORAL_TABLET | Freq: Three times a day (TID) | ORAL | 0 refills | Status: AC | PRN

## 2024-08-12 NOTE — Progress Notes (Signed)
 Office Visit Note   Patient: Vanessa Medina           Date of Birth: 1971/05/28           MRN: [id number] Visit Date: 08/12/2024              Requested by: Valora Lynwood FALCON, MD [address] [place],  [place] [address] PCP: Valora Lynwood FALCON, MD   Assessment & Plan: Visit Diagnoses:  1. Closed displaced fracture of lateral malleolus of left fibula, initial encounter   2. Syndesmotic disruption of ankle, left, initial encounter     Plan: History of Present Illness Vanessa Medina is a 53 year old female who presents with an unstable left ankle fracture requiring surgical intervention. She is accompanied by her father.  She injured her ankle last week. Initial care consisted of elevation. She was seen in the ER on August 04, 2024, where multiple reduction attempts were performed under light sedation before a later attempt by another provider was successful.  She uses a wheelchair for mobility at home and transfers short distances to the bathroom. Crutches and walkers have been difficult for her to use, and she does not have a knee scooter.  Assessment and Plan Displaced fracture of lateral malleolus of right fibula and probable syndesmosis disruption Unstable fracture requiring surgical fixation.  - Scheduled surgery for fibula fixation with plate and screws. - Advised non-weight bearing post-surgery. - Recommended obtaining a knee scooter for mobility. - Provided a six-month handicap placard. - Discussed obtaining a permanent handicap license plate. - detailed surgical plan discussed  Follow-Up Instructions: No follow-ups on file.   Orders:  No orders of the defined types were placed in this encounter.  No orders of the defined types were placed in this encounter.     Procedures: No procedures performed   Clinical Data: No additional findings.   Subjective: Chief Complaint  Patient presents with   Left Ankle - Pain, Follow-up     HPI  Review of Systems  Constitutional: Negative.   HENT: Negative.    Eyes: Negative.   Respiratory: Negative.    Cardiovascular: Negative.   Endocrine: Negative.   Musculoskeletal: Negative.   Neurological: Negative.   Hematological: Negative.   Psychiatric/Behavioral: Negative.    All other systems reviewed and are negative.    Objective: Vital Signs: There were no vitals taken for this visit.  Physical Exam Vitals and nursing note reviewed.  Constitutional:      Appearance: She is well-developed.  HENT:     Head: Atraumatic.     Nose: Nose normal.  Eyes:     Extraocular Movements: Extraocular movements intact.  Cardiovascular:     Pulses: Normal pulses.  Pulmonary:     Effort: Pulmonary effort is normal.  Abdominal:     Palpations: Abdomen is soft.  Musculoskeletal:     Cervical back: Neck supple.  Skin:    General: Skin is warm.     Capillary Refill: Capillary refill takes less than 2 seconds.  Neurological:     Mental Status: She is alert. Mental status is at baseline.  Psychiatric:        Behavior: Behavior normal.        Thought Content: Thought content normal.        Judgment: Judgment normal.     Ortho Exam  Specialty Comments:  No specialty comments available.  Imaging: No results found.   PMFS History: Patient Active Problem  List   Diagnosis Date Noted   Closed displaced fracture of lateral malleolus of left fibula 08/12/2024   Syndesmotic disruption of ankle, left, initial encounter 08/12/2024   Spinal cord stimulator dysfunction, sequela 06/12/2024   Breast asymmetry following reconstructive surgery 03/25/2024   S/P insertion of spinal cord stimulator 03/21/2024   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   Pain and swelling of lower leg 09/25/2023   Lymphedema 09/25/2023   Long term (current) use of aromatase inhibitors 09/07/2023   At risk for loss of bone density 09/07/2023   Changing skin lesion 09/04/2023   Frequent falls  08/22/2023   Chronic pain syndrome 08/22/2023   Bilateral lower extremity edema 08/06/2023   Back pain 08/06/2023   Claustrophobia    Pernicious anemia 03/14/2023   Ruptured left breast implant 02/13/2023   S/P mastectomy, bilateral 01/18/2023   Genetic testing 12/18/2022   Breast cancer (HCC) 12/08/2022   Family history of breast cancer 12/08/2022   Profound fatigue 12/08/2022   Lumbar post-laminectomy syndrome 06/02/2022   Pseudarthrosis after fusion or arthrodesis 05/19/2022   Primary osteoarthritis of right knee 03/28/2022   Subjective tinnitus of both ears 12/21/2021   Abnormality of gait 04/29/2021   Abnormal sensation in both ears 04/06/2021   Other adverse food reactions, not elsewhere classified, subsequent encounter 04/06/2021   Multiple drug allergies 04/06/2021   Insomnia due to medical condition 02/25/2021   Myofascial muscle pain 02/25/2021   Nerve pain 10/18/2020   Incomplete paraplegia (HCC) 10/18/2020   Orthostatic hypotension 10/18/2020   Renal cyst 09/23/2020   Chronic migraine without aura without status migrainosus, not intractable    Hypokalemia    Adjustment reaction with anxiety and depression    Spinal cord injury, lumbar, without spinal bone injury, sequela 08/18/2020   Paraparesis (HCC)    Post-operative pain    Hypotension    Slow transit constipation    AKI (acute kidney injury)    Right leg weakness 08/11/2020   DDD (degenerative disc disease), lumbar 09/02/2019    Class: Chronic   Degenerative disc disease, lumbar 09/02/2019   Chest tightness 09/23/2018   Bradycardia 09/23/2018   BMI 40.0-44.9, adult (HCC) 04/17/2018   Status post bariatric surgery 04/17/2018   Labile blood pressure 03/08/2018   Chronic low back pain 09/03/2017   History of total hip replacement, left 01/26/2016   Osteoarthritis of spine with radiculopathy, lumbar region 10/16/2014   LEG PAIN, BILATERAL 12/16/2007   Malignant neoplasm of cervix uteri (HCC) 07/02/2007    MORBID OBESITY 07/02/2007   DEPRESSION 07/02/2007   Migraine without aura 07/02/2007   Other allergic rhinitis 07/02/2007   Asthma 07/02/2007   GERD 07/02/2007   ELEVATED BLOOD PRESSURE WITHOUT DIAGNOSIS OF HYPERTENSION 07/02/2007   Asthma 07/02/2007   Past Medical History:  Diagnosis Date   Anemia    Anesthesia complication    a.) PONV; b.) delayed emergence   Anxiety    Asthma    Benign cyst of kidney    Breast cancer (HCC) 01/18/2023   left breast-bil mastectomies   Cervical cancer (HCC)    Chronic back pain    Chronic hypotension    CKD (chronic kidney disease), stage II    Claustrophobia    Decompression injury of spinal cord    Depression    Dizziness    Family history of adverse reaction to anesthesia    a.) delayed emergence in 1st degree female relative (mother)   History of dysplastic nevus 01/18/2018   right shoulder, recurrent dysplastic  nevus   History of kidney stones    History of shingles    Hypercholesteremia    Hypothyroidism    Inappropriate sinus node tachycardia    Migraine    Morbid obesity (HCC)    Multiple allergies    Neurogenic orthostatic hypotension (HCC)    Obesity    Osteoarthritis    left hip   Pneumonia    PONV (postoperative nausea and vomiting)    S/P insertion of spinal cord stimulator 01/24/2024   Seizure (HCC) 2021   x1-after a back surgery with spinal cord stimulator   Spinal cord stroke (HCC) 08/11/2020   spinal cord puncture during surgery   Wears glasses     Family History  Problem Relation Age of Onset   Renal Disease Mother    Hypertension Mother    Sudden Cardiac Death Mother    Heart failure Mother    Valvular heart disease Mother    Heart disease Mother    Heart disease Father        Just had stents placed   Stroke Brother    Heart attack Brother 5   Breast cancer Maternal Grandmother    Diabetes Other     Past Surgical History:  Procedure Laterality Date   ABDOMINAL HYSTERECTOMY     APPENDECTOMY      BACK SURGERY     BILIOPANCREATIC DIVISION W DUODENAL SWITCH N/A    BREAST BIOPSY Left 12/06/2022   u/s bx,1:00 heart clip, path pending   BREAST BIOPSY Left 12/06/2022   us  bx 2:00 ribbon clip path pending   BREAST BIOPSY Left 12/06/2022   US  LT BREAST BX W LOC DEV EA ADD LESION IMG BX SPEC US  GUIDE 12/06/2022 [facility]   BREAST BIOPSY Left 12/06/2022   US  LT BREAST BX W LOC DEV 1ST LESION IMG BX SPEC US  GUIDE 12/06/2022 [facility]   BREAST RECONSTRUCTION WITH PLACEMENT OF TISSUE EXPANDER AND FLEX HD (ACELLULAR HYDRATED DERMIS) Bilateral 01/18/2023   Procedure: IMMEDIATE LEFT BREAST RECONSTRUCTION WITH PLACEMENT OF TISSUE EXPANDER AND FLEX HD (ACELLULAR HYDRATED DERMIS);  Surgeon: [name], DO;  Location: [facility];  Service: Plastics;  Laterality: Bilateral;   BREAST RECONSTRUCTION WITH PLACEMENT OF TISSUE EXPANDER AND FLEX HD (ACELLULAR HYDRATED DERMIS) Left 03/26/2023   Procedure: BREAST RECONSTRUCTION WITH PLACEMENT OF TISSUE EXPANDER;  Surgeon: [name], DO;  Location: [facility];  Service: Plastics;  Laterality: Left;   CHOLECYSTECTOMY     COLONOSCOPY N/A 06/27/2021   Procedure: COLONOSCOPY;  Surgeon: Onita Elspeth Sharper, DO;  Location: [facility] END[facility]COPY;  Service: Gastroenterology;  Laterality: N/A;   COLONOSCOPY N/A 08/27/2023   Procedure: COLONOSCOPY;  Surgeon: Onita Elspeth Sharper, DO;  Location: [facility]COPY;  Service: Gastroenterology;  Laterality: N/A;   DIAGNOSTIC LAPAROSCOPY     LOA   ESOPHAGOGASTRODUODENOSCOPY N/A 06/27/2021   Procedure: ESOPHAGOGASTRODUODENOSCOPY (EGD);  Surgeon: Onita Elspeth Sharper, DO;  Location: [facility] ENDOSCOPY;  Service: Gastroenterology;  Laterality: N/A;   ESOPHAGOGASTRODUODENOSCOPY (EGD) WITH PROPOFOL  N/A 07/25/2017   Procedure: ESOPHAGOGASTRODUODENOSCOPY (EGD) WITH PROPOFOL ;  Surgeon: Viktoria Lamar DASEN, MD;  Location: [facility] ENDOSCOPY;  Service: Endoscopy;  Laterality: N/A;   HERNIA REPAIR     KNEE  ARTHROSCOPY Right    LAPAROSCOPIC GASTRIC SLEEVE RESECTION WITH HIATAL HERNIA REPAIR     LIPOSUCTION WITH LIPOFILLING Bilateral 05/21/2024   Procedure: LIPOSUCTION, WITH FAT TRANSFER;  Surgeon: [name], DO;  Location: [facility];  Service: Plastics;  Laterality: Bilateral;  Bilateral breast fat grafting  LUMBAR FUSION  08/29/2022   Revision Lumbar two through five Laminectomy & Fusion with Transforaminal Lumbar interbody fusion   LUMBAR SPINAL CORD STIMULATOR REVISION Left 06/26/2024   Procedure: Left sided internal pulse generator site revision;  Surgeon: [name] ORN, MD;  Location: [facility];  Service: Neurosurgery;  Laterality: Left;   MASTECTOMY W/ SENTINEL NODE BIOPSY Bilateral 01/18/2023   Procedure: MASTECTOMY WITH SENTINEL LYMPH NODE BIOPSY, RNFA to assist;  Surgeon: Jordis Laneta FALCON, MD;  Location: [facility];  Service: General;  Laterality: Bilateral;   PULSE GENERATOR IMPLANT N/A 01/24/2024   Procedure: UNILATERAL PULSE GENERATOR IMPLANT;  Surgeon: [name] ORN, MD;  Location: [facility];  Service: Neurosurgery;  Laterality: N/A;  INTERNAL PULSE GENERATOR PLACEMENT FOR SPINAL CORD STIMULATOR   REMOVAL OF TISSUE EXPANDER AND PLACEMENT OF IMPLANT Bilateral 06/06/2023   Procedure: REMOVAL OF TISSUE EXPANDER AND PLACEMENT OF IMPLANT;  Surgeon: [name], DO;  Location: [facility];  Service: Plastics;  Laterality: Bilateral;   SPINAL CORD STIMULATOR IMPLANT     THORACIC LAMINECTOMY FOR SPINAL CORD STIMULATOR N/A 01/15/2024   Procedure: THORACIC LAMINECTOMY FOR SPINAL CORD STIMULATOR;  Surgeon: [name] ORN, MD;  Location: [facility];  Service: Neurosurgery;  Laterality: N/A;  THORACIC LAMINECTOMY FOR SPINAL CORD STIMULATOR PADDLE TRIAL   TONSILLECTOMY     TOTAL HIP ARTHROPLASTY Left 01/26/2016   Procedure: LEFT TOTAL HIP ARTHROPLASTY ANTERIOR APPROACH;  Surgeon: Kay CHRISTELLA Cummins, MD;  Location: [facility];  Service: Orthopedics;  Laterality:  Left;   TRANSFORAMINAL LUMBAR INTERBODY FUSION (TLIF) WITH PEDICLE SCREW FIXATION 3 LEVEL  08/2019   Lynwood Better, MD L2-L5   Social History   Occupational History   Not on file  Tobacco Use   Smoking status: Never    Passive exposure: Never   Smokeless tobacco: Never  Vaping Use   Vaping status: Never Used  Substance and Sexual Activity   Alcohol use: Yes    Comment: occasional wine   Drug use: No   Sexual activity: Not Currently    Birth control/protection: Surgical    Comment: Hyst

## 2024-08-12 NOTE — Anesthesia Preprocedure Evaluation (Signed)
 Anesthesia Evaluation    History of Anesthesia Complications (+) PONV, PROLONGED EMERGENCE, Family history of anesthesia reaction and history of anesthetic complications  Airway       Comment: Previous grade I view with [place] 3, easy mask Dental   Pulmonary asthma           Cardiovascular (-) CAD (CAC of 0 09/28/2021) + dysrhythmias (PACs)   HLD  Echocardiogram 09/28/2021: Normal LV systolic function with visual EF 60-65%. Left ventricle cavity is normal in size. Normal left ventricular wall thickness. Normal global wall motion. Normal diastolic filling pattern, normal LAP. No significant valvular heart disease. Compared to study 08/14/2018 no significant change.     Neuro/Psych  Headaches, Seizures -,  PSYCHIATRIC DISORDERS Anxiety Depression    H/o spinal cord puncture in surgery 08/2020; spinal cord stimulator  Neuromuscular disease (lumbar radiculopathy s/p fusion, incomplete paraplegia)    GI/Hepatic ,GERD  ,,S/p gastric sleeve   Endo/Other  Hypothyroidism  Class 3 obesity  Renal/GU CRFRenal disease     Musculoskeletal  (+) Arthritis , Osteoarthritis,    Abdominal   Peds  Hematology  (+) Blood dyscrasia, anemia   Anesthesia Other Findings   Reproductive/Obstetrics H/o left breast cancer s/p bilateral mastectomies, h/o cervical cancer                              Anesthesia Physical Anesthesia Plan  ASA: 3  Anesthesia Plan: General   Post-op Pain Management: Regional block* and Tylenol  PO (pre-op)*   Induction: Intravenous  PONV Risk Score and Plan: 4 or greater and Ondansetron , Dexamethasone , Midazolam  and Treatment may vary due to age or medical condition  Airway Management Planned:   Additional Equipment:   Intra-op Plan:   Post-operative Plan: Extubation in OR  Informed Consent:   Plan Discussed with:   Anesthesia Plan Comments:         Anesthesia Quick  Evaluation

## 2024-08-12 NOTE — Telephone Encounter (Signed)
 Called pt to schedule and appt with Dr. Melanee. She is transferring to Dr. Melanee and wants to be scheduled after the new year.We have her scheduled for 1/9 at 1pm. She was okay with this date and time.

## 2024-08-13 ENCOUNTER — Encounter: Admitting: Podiatry

## 2024-08-13 ENCOUNTER — Encounter (HOSPITAL_BASED_OUTPATIENT_CLINIC_OR_DEPARTMENT_OTHER): Admission: RE | Disposition: A | Payer: Self-pay | Source: Home / Self Care | Attending: Orthopaedic Surgery

## 2024-08-13 ENCOUNTER — Encounter (HOSPITAL_BASED_OUTPATIENT_CLINIC_OR_DEPARTMENT_OTHER): Payer: Self-pay | Admitting: Orthopaedic Surgery

## 2024-08-13 ENCOUNTER — Encounter (HOSPITAL_BASED_OUTPATIENT_CLINIC_OR_DEPARTMENT_OTHER): Payer: Self-pay | Admitting: Anesthesiology

## 2024-08-13 ENCOUNTER — Ambulatory Visit (HOSPITAL_COMMUNITY)

## 2024-08-13 ENCOUNTER — Other Ambulatory Visit: Payer: Self-pay

## 2024-08-13 ENCOUNTER — Ambulatory Visit (HOSPITAL_BASED_OUTPATIENT_CLINIC_OR_DEPARTMENT_OTHER)
Admission: RE | Admit: 2024-08-13 | Discharge: 2024-08-13 | Disposition: A | Attending: Orthopaedic Surgery | Admitting: Orthopaedic Surgery

## 2024-08-13 ENCOUNTER — Ambulatory Visit (HOSPITAL_BASED_OUTPATIENT_CLINIC_OR_DEPARTMENT_OTHER): Payer: Self-pay | Admitting: Anesthesiology

## 2024-08-13 DIAGNOSIS — S93439A Sprain of tibiofibular ligament of unspecified ankle, initial encounter: Secondary | ICD-10-CM | POA: Insufficient documentation

## 2024-08-13 DIAGNOSIS — Z6841 Body Mass Index (BMI) 40.0 and over, adult: Secondary | ICD-10-CM

## 2024-08-13 DIAGNOSIS — Z9884 Bariatric surgery status: Secondary | ICD-10-CM | POA: Diagnosis not present

## 2024-08-13 DIAGNOSIS — I491 Atrial premature depolarization: Secondary | ICD-10-CM | POA: Diagnosis not present

## 2024-08-13 DIAGNOSIS — E039 Hypothyroidism, unspecified: Secondary | ICD-10-CM | POA: Diagnosis not present

## 2024-08-13 DIAGNOSIS — Z981 Arthrodesis status: Secondary | ICD-10-CM | POA: Insufficient documentation

## 2024-08-13 DIAGNOSIS — G8222 Paraplegia, incomplete: Secondary | ICD-10-CM | POA: Diagnosis not present

## 2024-08-13 DIAGNOSIS — Z01818 Encounter for other preprocedural examination: Secondary | ICD-10-CM

## 2024-08-13 DIAGNOSIS — F32A Depression, unspecified: Secondary | ICD-10-CM | POA: Insufficient documentation

## 2024-08-13 DIAGNOSIS — K219 Gastro-esophageal reflux disease without esophagitis: Secondary | ICD-10-CM | POA: Insufficient documentation

## 2024-08-13 DIAGNOSIS — E66813 Obesity, class 3: Secondary | ICD-10-CM | POA: Diagnosis not present

## 2024-08-13 DIAGNOSIS — Z8419 Family history of other disorders of kidney and ureter: Secondary | ICD-10-CM | POA: Insufficient documentation

## 2024-08-13 DIAGNOSIS — Z8249 Family history of ischemic heart disease and other diseases of the circulatory system: Secondary | ICD-10-CM | POA: Diagnosis not present

## 2024-08-13 DIAGNOSIS — M1990 Unspecified osteoarthritis, unspecified site: Secondary | ICD-10-CM | POA: Diagnosis not present

## 2024-08-13 DIAGNOSIS — F419 Anxiety disorder, unspecified: Secondary | ICD-10-CM | POA: Insufficient documentation

## 2024-08-13 DIAGNOSIS — S82842A Displaced bimalleolar fracture of left lower leg, initial encounter for closed fracture: Secondary | ICD-10-CM | POA: Insufficient documentation

## 2024-08-13 DIAGNOSIS — M5416 Radiculopathy, lumbar region: Secondary | ICD-10-CM | POA: Diagnosis not present

## 2024-08-13 DIAGNOSIS — Z853 Personal history of malignant neoplasm of breast: Secondary | ICD-10-CM | POA: Diagnosis not present

## 2024-08-13 DIAGNOSIS — X58XXXA Exposure to other specified factors, initial encounter: Secondary | ICD-10-CM | POA: Diagnosis not present

## 2024-08-13 DIAGNOSIS — Z9013 Acquired absence of bilateral breasts and nipples: Secondary | ICD-10-CM | POA: Diagnosis not present

## 2024-08-13 DIAGNOSIS — Z8541 Personal history of malignant neoplasm of cervix uteri: Secondary | ICD-10-CM | POA: Insufficient documentation

## 2024-08-13 DIAGNOSIS — N189 Chronic kidney disease, unspecified: Secondary | ICD-10-CM | POA: Diagnosis not present

## 2024-08-13 DIAGNOSIS — S8262XA Displaced fracture of lateral malleolus of left fibula, initial encounter for closed fracture: Secondary | ICD-10-CM | POA: Diagnosis present

## 2024-08-13 DIAGNOSIS — J45909 Unspecified asthma, uncomplicated: Secondary | ICD-10-CM

## 2024-08-13 DIAGNOSIS — S93432A Sprain of tibiofibular ligament of left ankle, initial encounter: Secondary | ICD-10-CM | POA: Diagnosis present

## 2024-08-13 HISTORY — PX: ORIF ANKLE FRACTURE: SHX5408

## 2024-08-13 SURGERY — OPEN REDUCTION INTERNAL FIXATION (ORIF) ANKLE FRACTURE
Anesthesia: General | Site: Ankle | Laterality: Left

## 2024-08-13 MED ORDER — AMISULPRIDE (ANTIEMETIC) 5 MG/2ML IV SOLN: 10.0000 mg | Freq: Once | INTRAVENOUS | Status: DC | PRN

## 2024-08-13 MED ORDER — DEXMEDETOMIDINE HCL IN NACL 80 MCG/20ML IV SOLN
INTRAVENOUS | Status: AC
  Filled 2024-08-13: qty 20

## 2024-08-13 MED ORDER — ROPIVACAINE HCL 5 MG/ML IJ SOLN
INTRAMUSCULAR | Status: DC | PRN
  Administered 2024-08-13: 30 mL via PERINEURAL

## 2024-08-13 MED ORDER — MIDAZOLAM HCL 2 MG/2ML IJ SOLN
INTRAMUSCULAR | Status: AC
  Filled 2024-08-13: qty 2

## 2024-08-13 MED ORDER — DEXMEDETOMIDINE HCL IN NACL 80 MCG/20ML IV SOLN
INTRAVENOUS | Status: DC | PRN
  Administered 2024-08-13 (×3): 4 ug via INTRAVENOUS

## 2024-08-13 MED ORDER — FENTANYL CITRATE (PF) 100 MCG/2ML IJ SOLN
INTRAMUSCULAR | Status: AC
  Filled 2024-08-13: qty 2

## 2024-08-13 MED ORDER — ONDANSETRON HCL 4 MG/2ML IJ SOLN
INTRAMUSCULAR | Status: AC
  Filled 2024-08-13: qty 2

## 2024-08-13 MED ORDER — BUPIVACAINE HCL (PF) 0.25 % IJ SOLN
INTRAMUSCULAR | Status: AC
  Filled 2024-08-13: qty 30

## 2024-08-13 MED ORDER — LIDOCAINE 2% (20 MG/ML) 5 ML SYRINGE
INTRAMUSCULAR | Status: DC | PRN
  Administered 2024-08-13: 100 mg via INTRAVENOUS

## 2024-08-13 MED ORDER — MIDAZOLAM HCL (PF) 2 MG/2ML IJ SOLN
2.0000 mg | Freq: Once | INTRAMUSCULAR | Status: AC
  Administered 2024-08-13: 1 mg via INTRAVENOUS

## 2024-08-13 MED ORDER — ACETAMINOPHEN 500 MG PO TABS
ORAL_TABLET | ORAL | Status: AC
  Filled 2024-08-13: qty 2

## 2024-08-13 MED ORDER — 0.9 % SODIUM CHLORIDE (POUR BTL) OPTIME
TOPICAL | Status: DC | PRN
  Administered 2024-08-13: 2000 mL

## 2024-08-13 MED ORDER — FENTANYL CITRATE (PF) 100 MCG/2ML IJ SOLN: 25.0000 ug | INTRAMUSCULAR | Status: DC | PRN

## 2024-08-13 MED ORDER — ASPIRIN 81 MG PO TBEC: 81.0000 mg | DELAYED_RELEASE_TABLET | Freq: Two times a day (BID) | ORAL | 0 refills | Status: AC

## 2024-08-13 MED ORDER — CEFAZOLIN SODIUM-DEXTROSE 2-4 GM/100ML-% IV SOLN
INTRAVENOUS | Status: AC
  Filled 2024-08-13: qty 100

## 2024-08-13 MED ORDER — LIDOCAINE 2% (20 MG/ML) 5 ML SYRINGE
INTRAMUSCULAR | Status: AC
  Filled 2024-08-13: qty 5

## 2024-08-13 MED ORDER — PHENYLEPHRINE 80 MCG/ML (10ML) SYRINGE FOR IV PUSH (FOR BLOOD PRESSURE SUPPORT)
PREFILLED_SYRINGE | INTRAVENOUS | Status: AC
  Filled 2024-08-13: qty 10

## 2024-08-13 MED ORDER — ACETAMINOPHEN 500 MG PO TABS
1000.0000 mg | ORAL_TABLET | Freq: Once | ORAL | Status: AC
  Administered 2024-08-13: 1000 mg via ORAL

## 2024-08-13 MED ORDER — LACTATED RINGERS IV SOLN: INTRAVENOUS | Status: DC

## 2024-08-13 MED ORDER — ATROPINE SULFATE (PF) 0.4 MG/ML IJ SOLN
INTRAMUSCULAR | Status: AC
  Filled 2024-08-13: qty 1

## 2024-08-13 MED ORDER — CEFAZOLIN SODIUM-DEXTROSE 3-4 GM/150ML-% IV SOLN
3.0000 g | INTRAVENOUS | Status: AC
  Administered 2024-08-13: 2 g via INTRAVENOUS

## 2024-08-13 MED ORDER — SUCCINYLCHOLINE CHLORIDE 200 MG/10ML IV SOSY
PREFILLED_SYRINGE | INTRAVENOUS | Status: AC
  Filled 2024-08-13: qty 10

## 2024-08-13 MED ORDER — FENTANYL CITRATE (PF) 100 MCG/2ML IJ SOLN
INTRAMUSCULAR | Status: DC | PRN
  Administered 2024-08-13 (×2): 50 ug via INTRAVENOUS
  Administered 2024-08-13: 25 ug via INTRAVENOUS

## 2024-08-13 MED ORDER — EPHEDRINE 5 MG/ML INJ
INTRAVENOUS | Status: AC
  Filled 2024-08-13: qty 5

## 2024-08-13 MED ORDER — FENTANYL CITRATE (PF) 100 MCG/2ML IJ SOLN
100.0000 ug | Freq: Once | INTRAMUSCULAR | Status: AC
  Administered 2024-08-13: 50 ug via INTRAVENOUS

## 2024-08-13 MED ORDER — PROPOFOL 10 MG/ML IV BOLUS
INTRAVENOUS | Status: DC | PRN
  Administered 2024-08-13: 200 mg via INTRAVENOUS
  Administered 2024-08-13: 50 mg via INTRAVENOUS

## 2024-08-13 MED ORDER — DEXAMETHASONE SOD PHOSPHATE PF 10 MG/ML IJ SOLN
INTRAMUSCULAR | Status: DC | PRN
  Administered 2024-08-13: 10 mg via INTRAVENOUS

## 2024-08-13 SURGICAL SUPPLY — 64 items
BIT DRILL 2.5X2.75 QC CALB (BIT) IMPLANT
BLADE SURG 15 STRL LF DISP TIS (BLADE) ×2 IMPLANT
BNDG COMPR ESMARK 6X3 LF (GAUZE/BANDAGES/DRESSINGS) IMPLANT
BNDG ELASTIC 4INX 5YD STR LF (GAUZE/BANDAGES/DRESSINGS) ×1 IMPLANT
BNDG ELASTIC 6INX 5YD STR LF (GAUZE/BANDAGES/DRESSINGS) ×1 IMPLANT
BRUSH SCRUB EZ PLAIN DRY (MISCELLANEOUS) ×1 IMPLANT
CANISTER SUCT 1200ML W/VALVE (MISCELLANEOUS) ×1 IMPLANT
COVER BACK TABLE 60X90IN (DRAPES) ×1 IMPLANT
CUFF TRNQT CYL 34X4.125X (TOURNIQUET CUFF) IMPLANT
DRAPE C-ARM 42X72 X-RAY (DRAPES) ×1 IMPLANT
DRAPE C-ARMOR (DRAPES) ×1 IMPLANT
DRAPE EXTREMITY T 121X128X90 (DISPOSABLE) ×1 IMPLANT
DRAPE IMP U-DRAPE 54X76 (DRAPES) ×1 IMPLANT
DRAPE INCISE IOBAN 66X45 STRL (DRAPES) IMPLANT
DRAPE U-SHAPE 47X51 STRL (DRAPES) ×1 IMPLANT
DRIVER RETENTION T15 LONG (ORTHOPEDIC DISPOSABLE SUPPLIES) IMPLANT
DURAPREP 26ML APPLICATOR (WOUND CARE) ×2 IMPLANT
ELECTRODE REM PT RTRN 9FT ADLT (ELECTROSURGICAL) ×1 IMPLANT
FIXATION ZIPTIGHT ANKLE SNDSMS (Ankle) IMPLANT
GAUZE PAD ABD 8X10 STRL (GAUZE/BANDAGES/DRESSINGS) ×5 IMPLANT
GAUZE SPONGE 4X4 12PLY STRL (GAUZE/BANDAGES/DRESSINGS) ×1 IMPLANT
GAUZE XEROFORM 1X8 LF (GAUZE/BANDAGES/DRESSINGS) ×1 IMPLANT
GLOVE BIOGEL PI IND STRL 7.5 (GLOVE) ×1 IMPLANT
GLOVE ECLIPSE 7.0 STRL STRAW (GLOVE) ×2 IMPLANT
GLOVE INDICATOR 7.0 STRL GRN (GLOVE) ×1 IMPLANT
GLOVE SURG SS PI 7.5 STRL IVOR (GLOVE) ×1 IMPLANT
GLOVE SURG SYN 7.5 PF PI (GLOVE) ×1 IMPLANT
GOWN STRL REUS W/ TWL LRG LVL3 (GOWN DISPOSABLE) ×1 IMPLANT
GOWN STRL SURGICAL XL XLNG (GOWN DISPOSABLE) ×1 IMPLANT
KWIRE ALPS MXV 1.6X6 ZI (WIRE) IMPLANT
KWIRE OLIVE PLT TACK 2.0-4 SU (WIRE) IMPLANT
MANIFOLD NEPTUNE II (INSTRUMENTS) ×1 IMPLANT
NDL HYPO 22X1.5 SAFETY MO (MISCELLANEOUS) IMPLANT
PACK BASIN DAY SURGERY FS (CUSTOM PROCEDURE TRAY) ×1 IMPLANT
PAD CAST 4YDX4 CTTN HI CHSV (CAST SUPPLIES) IMPLANT
PADDING CAST COTTON 6X4 STRL (CAST SUPPLIES) IMPLANT
PADDING CAST SYNTHETIC 4X4 STR (CAST SUPPLIES) ×1 IMPLANT
PADDING CAST SYNTHETIC 6X4 NS (CAST SUPPLIES) ×1 IMPLANT
PENCIL SMOKE EVACUATOR (MISCELLANEOUS) ×1 IMPLANT
PLATE LAT FIB 6H LT (Plate) IMPLANT
SCREW LOCK MD 2.7X20 (Screw) IMPLANT
SCREW LOCK MDS 2.7X16 (Screw) IMPLANT
SCREW LOCK MDS 2.7X18 (Screw) IMPLANT
SCREW LOCK MDS 3.5X14 (Screw) IMPLANT
SCREW NLOCK 3.5X44 (Screw) IMPLANT
SHEET MEDIUM DRAPE 40X70 STRL (DRAPES) ×1 IMPLANT
SLEEVE SCD COMPRESS KNEE MED (STOCKING) ×1 IMPLANT
SOLN 0.9% NACL POUR BTL 1000ML (IV SOLUTION) ×1 IMPLANT
SPIKE FLUID TRANSFER (MISCELLANEOUS) IMPLANT
SPLINT FIBERGLASS 4X30 (CAST SUPPLIES) IMPLANT
SPONGE T-LAP 18X18 ~~LOC~~+RFID (SPONGE) ×1 IMPLANT
STOCKINETTE 6 STRL (DRAPES) ×1 IMPLANT
SUCTION TUBE FRAZIER 10FR DISP (SUCTIONS) ×1 IMPLANT
SUT ETHILON 3 0 PS 1 (SUTURE) ×1 IMPLANT
SUT VIC AB 0 CT1 27XBRD ANBCTR (SUTURE) ×1 IMPLANT
SUT VIC AB 2-0 CT1 TAPERPNT 27 (SUTURE) ×1 IMPLANT
SUT VIC AB 3-0 SH 27X BRD (SUTURE) IMPLANT
SYR BULB EAR ULCER 3OZ GRN STR (SYRINGE) IMPLANT
SYR CONTROL 10ML LL (SYRINGE) IMPLANT
TOWEL GREEN STERILE FF (TOWEL DISPOSABLE) ×1 IMPLANT
TRAY DSU PREP LF (CUSTOM PROCEDURE TRAY) ×1 IMPLANT
TUBE CONNECTING 20X1/4 (TUBING) ×1 IMPLANT
UNDERPAD 30X36 HEAVY ABSORB (UNDERPADS AND DIAPERS) ×1 IMPLANT
YANKAUER SUCT BULB TIP NO VENT (SUCTIONS) ×1 IMPLANT

## 2024-08-13 NOTE — H&P (Signed)
 PREOPERATIVE H&P  Chief Complaint: left lateral malleolus fracture  HPI: Vanessa Medina is a 53 y.o. female who presents for surgical treatment of left lateral malleolus fracture.  She denies any changes in medical history.  Past Surgical History:  Procedure Laterality Date   ABDOMINAL HYSTERECTOMY     APPENDECTOMY     BACK SURGERY     BILIOPANCREATIC DIVISION W DUODENAL SWITCH N/A    BREAST BIOPSY Left 12/06/2022   u/s bx,1:00 heart clip, path pending   BREAST BIOPSY Left 12/06/2022   us  bx 2:00 ribbon clip path pending   BREAST BIOPSY Left 12/06/2022   US  LT BREAST BX W LOC DEV EA ADD LESION IMG BX SPEC US  GUIDE 12/06/2022 [facility]   BREAST BIOPSY Left 12/06/2022   US  LT BREAST BX W LOC DEV 1ST LESION IMG BX SPEC US  GUIDE 12/06/2022 [facility]   BREAST RECONSTRUCTION WITH PLACEMENT OF TISSUE EXPANDER AND FLEX HD (ACELLULAR HYDRATED DERMIS) Bilateral 01/18/2023   Procedure: IMMEDIATE LEFT BREAST RECONSTRUCTION WITH PLACEMENT OF TISSUE EXPANDER AND FLEX HD (ACELLULAR HYDRATED DERMIS);  Surgeon: [name], DO;  Location: [facility];  Service: Plastics;  Laterality: Bilateral;   BREAST RECONSTRUCTION WITH PLACEMENT OF TISSUE EXPANDER AND FLEX HD (ACELLULAR HYDRATED DERMIS) Left 03/26/2023   Procedure: BREAST RECONSTRUCTION WITH PLACEMENT OF TISSUE EXPANDER;  Surgeon: [name], DO;  Location: [facility];  Service: Plastics;  Laterality: Left;   CHOLECYSTECTOMY     COLONOSCOPY N/A 06/27/2021   Procedure: COLONOSCOPY;  Surgeon: Onita Elspeth Sharper, DO;  Location: [facility] ENDOSCOPY;  Service: Gastroenterology;  Laterality: N/A;   COLONOSCOPY N/A 08/27/2023   Procedure: COLONOSCOPY;  Surgeon: Onita Elspeth Sharper, DO;  Location: [facility] ENDOSCOPY;  Service: Gastroenterology;  Laterality: N/A;   DIAGNOSTIC LAPAROSCOPY     LOA   ESOPHAGOGASTRODUODENOSCOPY N/A 06/27/2021   Procedure: ESOPHAGOGASTRODUODENOSCOPY (EGD);  Surgeon: Onita Elspeth Sharper, DO;  Location: [facility];  Service: Gastroenterology;  Laterality: N/A;   ESOPHAGOGASTRODUODENOSCOPY (EGD) WITH PROPOFOL  N/A 07/25/2017   Procedure: ESOPHAGOGASTRODUODENOSCOPY (EGD) WITH PROPOFOL ;  Surgeon: Viktoria Lamar DASEN, MD;  Location: [facility] ENDOSCOPY;  Service: Endoscopy;  Laterality: N/A;   HERNIA REPAIR     KNEE ARTHROSCOPY Right    LAPAROSCOPIC GASTRIC SLEEVE RESECTION WITH HIATAL HERNIA REPAIR     LIPOSUCTION WITH LIPOFILLING Bilateral 05/21/2024   Procedure: LIPOSUCTION, WITH FAT TRANSFER;  Surgeon: [name], DO;  Location: [facility];  Service: Plastics;  Laterality: Bilateral;  Bilateral breast fat grafting   LUMBAR FUSION  08/29/2022   Revision Lumbar two through five Laminectomy & Fusion with Transforaminal Lumbar interbody fusion   LUMBAR SPINAL CORD STIMULATOR REVISION Left 06/26/2024   Procedure: Left sided internal pulse generator site revision;  Surgeon: [name] ORN, MD;  Location: [facility];  Service: Neurosurgery;  Laterality: Left;   MASTECTOMY W/ SENTINEL NODE BIOPSY Bilateral 01/18/2023   Procedure: MASTECTOMY WITH SENTINEL LYMPH NODE BIOPSY, RNFA to assist;  Surgeon: Jordis Laneta FALCON, MD;  Location: [facility];  Service: General;  Laterality: Bilateral;   PULSE GENERATOR IMPLANT N/A 01/24/2024   Procedure: UNILATERAL PULSE GENERATOR IMPLANT;  Surgeon: [name] ORN, MD;  Location: [facility];  Service: Neurosurgery;  Laterality: N/A;  INTERNAL PULSE GENERATOR PLACEMENT FOR SPINAL CORD STIMULATOR   REMOVAL OF TISSUE EXPANDER AND PLACEMENT OF IMPLANT Bilateral 06/06/2023   Procedure: REMOVAL OF TISSUE EXPANDER AND PLACEMENT OF IMPLANT;  Surgeon: [name], DO;  Location: [facility];  Service: Plastics;  Laterality: Bilateral;   SPINAL CORD STIMULATOR IMPLANT     THORACIC LAMINECTOMY FOR SPINAL CORD STIMULATOR N/A 01/15/2024   Procedure: THORACIC LAMINECTOMY FOR SPINAL CORD STIMULATOR;  Surgeon: [name] ORN, MD;  Location: [facility];  Service: Neurosurgery;  Laterality: N/A;  THORACIC LAMINECTOMY FOR SPINAL CORD STIMULATOR PADDLE TRIAL   TONSILLECTOMY     TOTAL HIP ARTHROPLASTY Left 01/26/2016   Procedure: LEFT TOTAL HIP ARTHROPLASTY ANTERIOR APPROACH;  Surgeon: Kay CHRISTELLA Cummins, MD;  Location: [facility];  Service: Orthopedics;  Laterality: Left;   TRANSFORAMINAL LUMBAR INTERBODY FUSION (TLIF) WITH PEDICLE SCREW FIXATION 3 LEVEL  08/2019   Lynwood Better, MD L2-L5   Social History   Socioeconomic History   Marital status: Divorced    Spouse name: Not on file   Number of children: 1   Years of education: Not on file   Highest education level: Not on file  Occupational History   Not on file  Tobacco Use   Smoking status: Never    Passive exposure: Never   Smokeless tobacco: Never  Vaping Use   Vaping status: Never Used  Substance and Sexual Activity   Alcohol use: Yes    Comment: occasional wine   Drug use: No   Sexual activity: Not Currently    Birth control/protection: Surgical    Comment: Hyst  Other Topics Concern   Not on file  Social History Narrative   Right handed    Uses cane to walk    Son has Medical and legal POA.   Social Drivers of Corporate Investment Banker Strain: Low Risk  (10/31/2023)   Received from [facility]   Overall Financial Resource Strain (CARDIA)    Difficulty of Paying Living Expenses: Not very hard  Food Insecurity: No Food Insecurity (10/31/2023)   Received from [facility]   Hunger Vital Sign    Within the past 12 months, you worried that your food would run out before you got the money to buy more.: Never true    Within the past 12 months, the food you bought just didn't last and you didn't have money to get more.: Never true  Transportation Needs: No Transportation Needs (10/31/2023)   Received from [facility] - Transportation    In the past 12 months, has lack of transportation kept  you from medical appointments or from getting medications?: No    Lack of Transportation (Non-Medical): No  Physical Activity: Inactive (12/11/2022)   Exercise Vital Sign    Days of Exercise per Week: 0 days    Minutes of Exercise per Session: 0 min  Stress: Stress Concern Present (12/11/2022)   [employer] of Occupational Health - Occupational Stress Questionnaire    Feeling of Stress : To some extent  Social Connections: Moderately Isolated (12/11/2022)   Social Connection and Isolation Panel    Frequency of Communication with Friends and Family: Three times a week    Frequency of Social Gatherings with Friends and Family: Twice a week    Attends Religious Services: 1 to 4 times per year    Active Member of [employer] or Organizations: No    Attends Banker Meetings: Never    Marital Status: Divorced   Family History  Problem Relation Age of Onset   Renal Disease Mother    Hypertension Mother    Sudden Cardiac Death Mother    Heart failure Mother    Valvular heart disease  Mother    Heart disease Mother    Heart disease Father        Just had stents placed   Stroke Brother    Heart attack Brother 61   Breast cancer Maternal Grandmother    Diabetes Other    Allergies  Allergen Reactions   Bee Venom Swelling   Iodinated Contrast Media Anaphylaxis    Swelling  Other reaction(s): Other (See Comments)  Swelling   Swelling   Latex Hives and Rash   Morphine  Other (See Comments)    BRADYCARDIA   Shellfish Allergy Anaphylaxis   Codeine  Nausea And Vomiting   Meperidine Hcl Other (See Comments)    BRADYCARDIA   Bee Pollen Other (See Comments)    Unknown   Oxycodone  Hives and Itching    Blisters on back   Tape Hives    Adhesive   Pentazocine Lactate Nausea And Vomiting    REACTION: vomiting with Talwin NX   Propoxyphene Nausea Only and Nausea And Vomiting   Prior to Admission medications   Medication Sig Start Date End Date Taking? Authorizing Provider   azelastine (ASTELIN) 0.1 % nasal spray Place 1 spray into both nostrils 2 (two) times daily as needed for allergies. Use in each nostril as directed   Yes [provider]  baclofen  (LIORESAL ) 10 MG tablet Take 1 tablet (10 mg total) by mouth 3 (three) times daily. - for spasms 11/30/23  Yes Lovorn, Megan, MD  Calcium  Carb-Cholecalciferol (CALCIUM +D3 PO) Take 1 tablet by mouth in the morning and at bedtime.   Yes [provider]  cetirizine  (ZYRTEC  ALLERGY) 10 MG tablet Take 1 tablet (10 mg total) by mouth daily for 14 days. 07/06/24 08/12/24 Yes Corlis Burnard DEL, NP  colestipol (COLESTID) 1 g tablet Take 1 g by mouth at bedtime. 08/30/23 08/29/24 Yes [provider]  DULoxetine  (CYMBALTA ) 60 MG capsule Take 2 capsules (120 mg total) by mouth at bedtime. 08/22/23  Yes Lovorn, Megan, MD  fluticasone  (FLONASE ) 50 MCG/ACT nasal spray Place 2 sprays into both nostrils daily as needed for allergies.   Yes [provider]  HYDROcodone -acetaminophen  (NORCO/VICODIN) 5-325 MG tablet Take 1 tablet by mouth 3 (three) times daily as needed. 08/12/24  Yes Jule Ronal CROME, PA-C  letrozole  (FEMARA ) 2.5 MG tablet Take 2.5 mg by mouth daily. 12/28/23  Yes [provider]  levothyroxine (SYNTHROID) 25 MCG tablet Take 25 mcg by mouth daily before breakfast. 04/18/24 04/18/25 Yes [provider]  montelukast  (SINGULAIR ) 10 MG tablet TAKE 1 TABLET BY MOUTH AT  BEDTIME FOR ALLERGIES 01/04/23  Yes Lovorn, Megan, MD  Multiple Vitamin (MULTIVITAMIN WITH MINERALS) TABS tablet Take 1 tablet by mouth 2 (two) times daily. 08/31/20  Yes Love, Sharlet RAMAN, PA-C  pramipexole  (MIRAPEX ) 0.125 MG tablet Take 0.5 mg by mouth at bedtime. 07/31/17  Yes [provider]  pyridOXINE (VITAMIN B-6) 100 MG tablet Take 1 tablet (100 mg total) by mouth daily. 09/10/20  Yes Ladona Heinz, MD  thiamine 250 MG tablet Take 250 mg by mouth at bedtime.   Yes [provider]  tolterodine  (DETROL  LA)  4 MG 24 hr capsule Take 1 capsule (4 mg total) by mouth every evening. 03/18/24  Yes Jacobo Evalene PARAS, MD  topiramate  (TOPAMAX ) 100 MG tablet Take 1 tablet (100 mg total) by mouth 2 (two) times daily. Take 1 tab BID- but if you cannot tolerate, take 200 mg at bedtime- for headache prevention 06/20/24  Yes Lovorn, Megan, MD  vitamin  B-12 (CYANOCOBALAMIN ) 1000 MCG tablet Take 1 tablet (1,000 mcg total) by mouth daily. 09/10/20  Yes Ladona Heinz, MD  ALPRAZolam  (XANAX ) 0.25 MG tablet Take 0.25 mg by mouth at bedtime as needed for sleep. Patient not taking: Reported on 08/12/2024 11/09/23   [provider]  clobetasol  ointment (TEMOVATE ) 0.05 % Apply 1 gram topically to affected area of skin twice daily. Stop once resolved and restart as needed for flares. Avoid use on face, armpits, groin unless otherwise indicated. 07/30/24   Raymund Lauraine BROCKS, MD  diclofenac  Sodium (VOLTAREN ) 1 % GEL Apply 2 g topically 4 (four) times daily as needed (pain). 02/04/24   Jerri Kay HERO, MD  Dupilumab  (DUPIXENT ) 300 MG/2ML SOAJ Inject 300 mg into the skin every 14 (fourteen) days. Starting at day 15 for maintenance. 07/30/24   Raymund Lauraine BROCKS, MD  EPINEPHrine  0.3 mg/0.3 mL IJ SOAJ injection Inject 0.3 mg into the muscle as needed for anaphylaxis. 03/22/21   [provider]  gabapentin  (NEURONTIN ) 300 MG capsule Take 1 capsule (300 mg total) by mouth at bedtime. For nerve pain Patient not taking: Reported on 08/12/2024 11/30/23   Lovorn, Megan, MD  HYDROcodone -acetaminophen  (NORCO/VICODIN) 5-325 MG tablet Take 1 tablet by mouth every 6 (six) hours as needed for moderate pain (pain score 4-6). 06/26/24   Ulis Bottcher, PA-C  HYDROcodone -acetaminophen  (NORCO/VICODIN) 5-325 MG tablet Take 1 tablet by mouth every 4 (four) hours as needed. 08/04/24   Tegeler, Lonni PARAS, MD  hydrOXYzine  (ATARAX ) 10 MG tablet Take 1 tablet (10 mg total) by mouth 3 (three) times daily as needed. Patient not taking: Reported on 08/12/2024  01/18/24   Ulis Bottcher, PA-C  lidocaine  (LIDODERM ) 5 % Place 1 patch onto the skin daily for 18 days. Remove & Discard patch within 12 hours or as directed by MD 06/12/24 07/30/24  [name] ORN, MD  mupirocin  ointment (BACTROBAN ) 2 % Apply 1 Application topically 2 (two) times daily. 07/06/24   Corlis Burnard DEL, NP     Positive ROS: All other systems have been reviewed and were otherwise negative with the exception of those mentioned in the HPI and as above.  Physical Exam: General: Alert, no acute distress Cardiovascular: No pedal edema Respiratory: No cyanosis, no use of accessory musculature GI: abdomen soft Skin: No lesions in the area of chief complaint Neurologic: Sensation intact distally Psychiatric: Patient is competent for consent with normal mood and affect Lymphatic: no lymphedema  MUSCULOSKELETAL: exam stable  Assessment: left lateral malleolus fracture  Plan: Plan for Procedure(s): OPEN REDUCTION INTERNAL FIXATION (ORIF) ANKLE FRACTURE REPAIR, SYNDESMOSIS, ANKLE  The risks benefits and alternatives were discussed with the patient including but not limited to the risks of nonoperative treatment, versus surgical intervention including infection, bleeding, nerve injury,  blood clots, cardiopulmonary complications, morbidity, mortality, among others, and they were willing to proceed.   Ozell Jerri, MD 08/13/2024 9:33 AM

## 2024-08-13 NOTE — Anesthesia Procedure Notes (Signed)
 Anesthesia Regional Block: Popliteal block   Pre-Anesthetic Checklist: , timeout performed,  Correct Patient, Correct Site, Correct Laterality,  Correct Procedure, Correct Position, site marked,  Risks and benefits discussed,  Surgical consent,  Pre-op evaluation,  At surgeon's request and post-op pain management  Laterality: Left  Prep: chloraprep       Needles:  Injection technique: Single-shot  Needle Type: Echogenic Stimulator Needle     Needle Length: 9cm  Needle Gauge: 21     Additional Needles:   Procedures:,,,, ultrasound used (permanent image in chart),,    Narrative:  Start time: 08/13/2024 11:00 AM End time: 08/13/2024 11:05 AM Injection made incrementally with aspirations every 5 mL.  Performed by: Personally  Anesthesiologist: Peggye Delon Brunswick, MD  Additional Notes: Discussed risks and benefits of nerve block including, but not limited to, prolonged and/or permanent nerve injury involving sensory and/or motor function. Monitors were applied and a time-out was performed. The nerve and associated structures were visualized under ultrasound guidance. After negative aspiration, local anesthetic was slowly injected around the nerve. There was no evidence of high pressure during the procedure. There were no paresthesias. VSS remained stable and the patient tolerated the procedure well.

## 2024-08-13 NOTE — Transfer of Care (Signed)
 Immediate Anesthesia Transfer of Care Note  Patient: [name]  Procedure(s) Performed: OPEN REDUCTION INTERNAL FIXATION (ORIF) ANKLE FRACTURE; Syndesmosis repair (Left: Ankle)  Patient Location: PACU  Anesthesia Type:GA combined with regional for post-op pain  Level of Consciousness: sedated  Airway & Oxygen Therapy: Patient Spontanous Breathing and Patient connected to face mask oxygen  Post-op Assessment: Report given to RN and Post -op Vital signs reviewed and stable  Post vital signs: Reviewed and stable  Last Vitals:  Vitals Value Taken Time  BP    Temp    Pulse 82 08/13/24 13:50  Resp    SpO2 96 % 08/13/24 13:50  Vitals shown include unfiled device data.  Last Pain:  Vitals:   08/13/24 1002  TempSrc: Tympanic  PainSc: 6       Patients Stated Pain Goal: 9 (08/13/24 1002)  Complications: No notable events documented.

## 2024-08-13 NOTE — Op Note (Signed)
 Date of Surgery: 08/13/2024  INDICATIONS: Vanessa Medina is a 53 y.o.-year-old female who sustained a left ankle fracture; she was indicated for open reduction and internal fixation due to the displaced nature of the articular fracture and came to the operating room today for this procedure. The patient did consent to the procedure after discussion of the risks and benefits.  PREOPERATIVE DIAGNOSIS: left bimalleolar ankle fracture with syndesmosis disruption  POSTOPERATIVE DIAGNOSIS: Same.  PROCEDURE:  Open treatment of left ankle fracture with internal fixation. Bimalleolar CPT [id number] Open treatment of left ankle syndesmosis with internal fixation. CPT [phone]  SURGEON: N. Ozell Cummins, M.D.  ASSIST: None  ANESTHESIA:  general, popliteal block  TOURNIQUET TIME: 57 minutes  IV FLUIDS AND URINE: See anesthesia.  ESTIMATED BLOOD LOSS: minimal mL.  IMPLANTS: Implant Name Type Inv. Item Serial No. Manufacturer Lot No. LRB No. Used Action  FIXATION ZIPTIGHT ANKLE SNDSMS - [id number] Ankle FIXATION ZIPTIGHT ANKLE SNDSMS  [employer](ORTH,TRAU,BIO,SG) [id number] Left 1 Implanted  SCREW LOCK MDS 2.7X18 - [id number] Screw SCREW LOCK MDS 2.7X18  [employer](ORTH,TRAU,BIO,SG) ON STERILE TRAY Left 2 Implanted  SCREW LOCK MDS 2.7X16 - [id number] Screw SCREW LOCK MDS 2.7X16  [employer]ON(ORTH,TRAU,BIO,SG) ON STERILE TRAY Left 2 Implanted  SCREW LOCK MD 2.7X20 - [id number] Screw SCREW LOCK MD 2.7X20  [employer]MER [employer]ON(ORTH,TRAU,BIO,SG) ON STERILE TRAY Left 1 Implanted and Explanted  SCREW LOCK MDS 3.5X14 - [id number] Screw SCREW LOCK MDS 3.5X14  [employer]MER [employer]ON(ORTH,TRAU,BIO,SG) ON STERILE TRAY Left 4 Implanted  PLATE LAT FIB 6H LT - [id number] Plate PLATE LAT FIB 6H LT  Z[employer]MER [employer]ON(ORTH,TRAU,BIO,SG) ON STERILE TRAY Left 1 Implanted  SCREW NLOCK 3.5X44 - [id number] Screw SCREW NLOCK 3.5X44  ZIMMER [employer]ON(ORTH,TRAU,BIO,SG) ON STERILE TRAY Left 1 Implanted    COMPLICATIONS: see description of  procedure.  DESCRIPTION OF PROCEDURE: The patient was brought to the operating room and placed supine on the operating table.  The patient had been signed prior to the procedure and this was documented. The patient had the anesthesia placed by the anesthesiologist.  A nonsterile tourniquet was placed on the upper thigh.  The prep verification and incision time-outs were performed to confirm that this was the correct patient, site, side and location. The patient had an SCD on the opposite lower extremity. The patient did receive antibiotics prior to the incision and was re-dosed during the procedure as needed at indicated intervals.  The patient had the lower extremity prepped and draped in the standard surgical fashion.  The extremity was exsanguinated using an esmarch bandage and the tourniquet was inflated to 300 mm Hg.  Bony landmarks were palpated and marked on the distal fibula and skin incision was made.  Full-thickness flaps were raised off of the fibula.  Fracture line was seen.  Bone quality was poor.  Fracture was basically a transverse fracture that could not be lagged.  The fracture was mobilized and pulled out to length and reduced with a lobster claw.  The medial malleolus avulsion fracture was indirectly reduced.  A precontoured 6-hole distal fibula plate was placed on the  fibula and screws were used to secure the plate.  Locking screws were used due to the poor bone quality.  I was happy with the reduction and overall fixation.  Stress exam of the syndesmosis showed widening of the syndesmosis interval.  Stab incision was made over the medial side of the distal tibia.  A King tong clamp was applied and the syndesmosis was reduced with the ankle in maximum dorsiflexion.  The King tong clamp was tightened.  A zip tight device was passed across the distal tibiofibular joint parallel to the ankle joint.  The flip button was delivered on top of the medial distal tibial cortex and flipped and then  tightened down.  For extra fixation I placed an additional 3.5 mm nonlocking screw across the syndesmosis parallel to the ankle joint for rigid fixation.  King tong clamp was removed.  The surgical sites were thoroughly irrigated with normal saline and closed in layered fashion.  Sterile dressings were applied.  Short leg splint was placed.  Patient tolerated procedure well had no any complications.  POSTOPERATIVE PLAN: Ms. Scovell will remain nonweightbearing on this leg for approximately 6 weeks; Ms. Bardon will return for suture removal in 2 weeks.  He will be immobilized in a short leg splint and then transitioned to a CAM walker at his first follow up appointment.  Ms. Allmendinger will receive DVT prophylaxis based on other medications, activity level, and risk ratio of bleeding to thrombosis.  GEANNIE Ozell Cummins, MD [facility] 1:47 PM

## 2024-08-13 NOTE — Anesthesia Postprocedure Evaluation (Signed)
 Anesthesia Post Note  Patient: Vanessa Medina  Procedure(s) Performed: OPEN REDUCTION INTERNAL FIXATION (ORIF) ANKLE FRACTURE; Syndesmosis repair (Left: Ankle)     Patient location during evaluation: PACU Anesthesia Type: General Level of consciousness: awake Pain management: pain level controlled Vital Signs Assessment: post-procedure vital signs reviewed and stable Respiratory status: spontaneous breathing, nonlabored ventilation and respiratory function stable Cardiovascular status: blood pressure returned to baseline and stable Postop Assessment: no apparent nausea or vomiting Anesthetic complications: no   No notable events documented.  Last Vitals:  Vitals:   08/13/24 1415 08/13/24 1430  BP: (!) 152/83   Pulse: 71 67  Resp: 12 13  Temp:    SpO2: 100% 97%    Last Pain:  Vitals:   08/13/24 1415  TempSrc:   PainSc: 0-No pain                 Delon Aisha Arch

## 2024-08-13 NOTE — Progress Notes (Signed)
Assisted Dr. Neoma Laming with left, popliteal, ultrasound guided block. Side rails up, monitors on throughout procedure. See vital signs in flow sheet. Tolerated Procedure well.

## 2024-08-13 NOTE — Anesthesia Procedure Notes (Signed)
 Procedure Name: LMA Insertion Date/Time: 08/13/2024 12:21 PM  Performed by: Julieanne Fairy BROCKS, CRNAPre-anesthesia Checklist: Patient identified, Emergency Drugs available, Suction available and Patient being monitored Patient Re-evaluated:Patient Re-evaluated prior to induction Oxygen Delivery Method: Circle system utilized Preoxygenation: Pre-oxygenation with 100% oxygen Induction Type: IV induction Ventilation: Mask ventilation without difficulty LMA: LMA inserted LMA Size: 4.0 Number of attempts: 1 Airway Equipment and Method: Bite block Placement Confirmation: positive ETCO2 Tube secured with: Tape Dental Injury: Teeth and Oropharynx as per pre-operative assessment

## 2024-08-13 NOTE — Discharge Instructions (Addendum)
 Postoperative instructions:  Weightbearing instructions: non weight bearing  Dressing instructions: Keep your dressing and/or splint clean and dry at all times.  It will be removed at your first post-operative appointment.  Your stitches and/or staples will be removed at this visit.  Incision instructions:  Do not soak your incision for 3 weeks after surgery.  If the incision gets wet, pat dry and do not scrub the incision.  Pain control:  You have been given a prescription to be taken as directed for post-operative pain control.  In addition, elevate the operative extremity above the heart at all times to prevent swelling and throbbing pain.  Take over-the-counter Colace, 100mg  by mouth twice a day while taking narcotic pain medications to help prevent constipation.  Follow up appointments: 1) 14 days for suture removal and wound check. 2) Vanessa Medina (Dr. Benjiman PA) as scheduled.   -------------------------------------------------------------------------------------------------------------  After Surgery Pain Control:  After your surgery, post-surgical discomfort or pain is likely. This discomfort can last several days to a few weeks. At certain times of the day your discomfort may be more intense.  Did you receive a nerve block?  A nerve block can provide pain relief for one hour to two days after your surgery. As long as the nerve block is working, you will experience little or no sensation in the area the surgeon operated on.  As the nerve block wears off, you will begin to experience pain or discomfort. It is very important that you begin taking your prescribed pain medication before the nerve block fully wears off. Treating your pain at the first sign of the block wearing off will ensure your pain is better controlled and more tolerable when full-sensation returns. Do not wait until the pain is intolerable, as the medicine will be less effective. It is better to treat pain in  advance than to try and catch up.  General Anesthesia:  If you did not receive a nerve block during your surgery, you will need to start taking your pain medication shortly after your surgery and should continue to do so as prescribed by your surgeon.  Pain Medication:  Most commonly we prescribe Vicodin and Percocet for post-operative pain. Both of these medications contain a combination of acetaminophen  (Tylenol ) and a narcotic to help control pain.   It takes between 30 and 45 minutes before pain medication starts to work. It is important to take your medication before your pain level gets too intense.   Nausea is a common side effect of many pain medications. You will want to eat something before taking your pain medicine to help prevent nausea.   If you are taking a prescription pain medication that contains acetaminophen , we recommend that you do not take additional over the counter acetaminophen  (Tylenol ).  Other pain relieving options:   Using a cold pack to ice the affected area a few times a day (15 to 20 minutes at a time) can help to relieve pain, reduce swelling and bruising.   Elevation of the affected area can also help to reduce pain and swelling.  Per [facility] clinic policy, our goal is ensure optimal postoperative pain control with a multimodal pain management strategy. For all [facility] patients, our goal is to wean post-operative narcotic medications by 6 weeks post-operatively. If this is not possible due to utilization of pain medication prior to surgery, your [facility] doctor will support your acute post-operative pain control for the first 6 weeks postoperatively, with a plan to transition you  back to your primary pain team following that. Vanessa Medina will work to ensure a therapist, occupational.    Post Anesthesia Home Care Instructions  Activity: Get plenty of rest for the remainder of the day. A responsible individual must stay with you for 24 hours following the  procedure.  For the next 24 hours, DO NOT: -Drive a car -Advertising copywriter -Drink alcoholic beverages -Take any medication unless instructed by your physician -Make any legal decisions or sign important papers.  Meals: Start with liquid foods such as gelatin or soup. Progress to regular foods as tolerated. Avoid greasy, spicy, heavy foods. If nausea and/or vomiting occur, drink only clear liquids until the nausea and/or vomiting subsides. Call your physician if vomiting continues.  Special Instructions/Symptoms: Your throat may feel dry or sore from the anesthesia or the breathing tube placed in your throat during surgery. If this causes discomfort, gargle with warm salt water . The discomfort should disappear within 24 hours.  If you had a scopolamine  patch placed behind your ear for the management of post- operative nausea and/or vomiting:  1. The medication in the patch is effective for 72 hours, after which it should be removed.  Wrap patch in a tissue and discard in the trash. Wash hands thoroughly with soap and water . 2. You may remove the patch earlier than 72 hours if you experience unpleasant side effects which may include dry mouth, dizziness or visual disturbances. 3. Avoid touching the patch. Wash your hands with soap and water  after contact with the patch.     Regional Anesthesia Blocks  1. You may not be able to move or feel the blocked extremity after a regional anesthetic block. This may last may last from 3-48 hours after placement, but it will go away. The length of time depends on the medication injected and your individual response to the medication. As the nerves start to wake up, you may experience tingling as the movement and feeling returns to your extremity. If the numbness and inability to move your extremity has not gone away after 48 hours, please call your surgeon.   2. The extremity that is blocked will need to be protected until the numbness is gone and the  strength has returned. Because you cannot feel it, you will need to take extra care to avoid injury. Because it may be weak, you may have difficulty moving it or using it. You may not know what position it is in without looking at it while the block is in effect.  3. For blocks in the legs and feet, returning to weight bearing and walking needs to be done carefully. You will need to wait until the numbness is entirely gone and the strength has returned. You should be able to move your leg and foot normally before you try and bear weight or walk. You will need someone to be with you when you first try to ensure you do not fall and possibly risk injury.  4. Bruising and tenderness at the needle site are common side effects and will resolve in a few days.  5. Persistent numbness or new problems with movement should be communicated to the surgeon or the [facility] [phone] [facility] [phone]).  No tylenol  until after 4:15pm today

## 2024-08-14 ENCOUNTER — Encounter (HOSPITAL_BASED_OUTPATIENT_CLINIC_OR_DEPARTMENT_OTHER): Payer: Self-pay | Admitting: Orthopaedic Surgery

## 2024-08-14 ENCOUNTER — Ambulatory Visit: Admitting: Physical Therapy

## 2024-08-18 ENCOUNTER — Ambulatory Visit: Admitting: Oncology

## 2024-08-21 ENCOUNTER — Encounter: Admitting: Physical Therapy

## 2024-08-22 ENCOUNTER — Ambulatory Visit: Admitting: Plastic Surgery

## 2024-08-26 ENCOUNTER — Ambulatory Visit: Admitting: Orthopaedic Surgery

## 2024-08-26 ENCOUNTER — Other Ambulatory Visit (INDEPENDENT_AMBULATORY_CARE_PROVIDER_SITE_OTHER): Payer: Self-pay

## 2024-08-26 DIAGNOSIS — M25572 Pain in left ankle and joints of left foot: Secondary | ICD-10-CM

## 2024-08-26 NOTE — Progress Notes (Signed)
 "  Post-Op Visit Note   Patient: Vanessa Medina           Date of Birth: Nov 30, 1970           MRN: [id number] Visit Date: 08/26/2024 PCP: Valora Lynwood FALCON, MD   Assessment & Plan:  Chief Complaint:  Chief Complaint  Patient presents with   Left Ankle - Follow-up, Pain    08/13/24- OPEN REDUCTION INTERNAL FIXATION (ORIF) ANKLE FRACTURE; Syndesmosis repair - Left     Visit Diagnoses:  1. Acute left ankle pain     Plan: History of Present Illness Vanessa Medina is a 53 year old female with recent left ankle fracture status post open reduction and internal fixation who presents for postoperative follow-up.  She is two weeks status post left ankle open reduction and internal fixation performed on August 13, 2024, with placement of a long lateral plate, screws, and a tightrope device. She remains non-weight bearing on the left lower extremity and is here for suture removal and transition to a boot. She has no new surgical site symptoms.  Physical Exam MUSCULOSKELETAL: Left ankle shows healed surgical incisions.  No signs of infection.  Expected postoperative findings.  Results Radiology Left ankle x-ray (08/26/2024): Postoperative alignment maintained with internal fixation hardware including long lateral plate, screws, and tightrope device; small fracture present, expected to heal; no significant displacement; anatomic realignment compared to preoperative imaging. (Independently interpreted) Right knee x-ray (02/2023): Posttraumatic osteoarthritis with cartilage loss; prior ACL reconstruction; advanced degenerative changes. (Independently interpreted)  Assessment and Plan Left ankle fracture, postoperative care with internal fixation Postoperative imaging showed stable alignment and hardware. Small additional fracture expected to heal without issue. Bone quality was soft but fixation stable. Syndesmosis screw removal optional unless symptomatic. - Removed sutures. -  Placed in boot. - Instructed on ankle range of motion exercises. - Advised strict nonweight bearing for six weeks, then partial weight bearing for four weeks, full weight bearing at ten weeks. - Permitted showering, avoid soaking for one week. - Discussed optional syndesmosis screw removal at 4-6 months. - Reassured about bone quality differences between intraoperative assessment and bone density scan. - Encouraged calcium  and vitamin D supplementation. - Scheduled follow-up in four weeks.  Post traumatic osteoarthritis of right knee, post ACL reconstruction Chronic osteoarthritis worsened by increased reliance on right leg. Current management supportive until knee arthroplasty feasible. Emphasized weight loss and quadriceps strengthening. - Encouraged weight loss. - Advised quadriceps strengthening. - Reinforced supportive management until knee arthroplasty.  Follow-Up Instructions: Return in about 4 weeks (around 09/23/2024) for with lindsey.   Orders:  Orders Placed This Encounter  Procedures   XR Ankle Complete Left   No orders of the defined types were placed in this encounter.   Imaging: XR Ankle Complete Left Result Date: 08/26/2024 X-rays of the left ankle show stable alignment and fixation of a bimalleolar ankle fracture and syndesmosis   PMFS History: Patient Active Problem List   Diagnosis Date Noted   Bimalleolar fracture of left ankle, closed, initial encounter 08/13/2024   Closed displaced fracture of lateral malleolus of left fibula 08/12/2024   Syndesmotic disruption of ankle, left, initial encounter 08/12/2024   Spinal cord stimulator dysfunction, sequela 06/12/2024   Breast asymmetry following reconstructive surgery 03/25/2024   S/P insertion of spinal cord stimulator 03/21/2024   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   Pain and swelling of lower leg 09/25/2023   Lymphedema 09/25/2023   Long term (current) use of aromatase inhibitors 09/07/2023  At  risk for loss of bone density 09/07/2023   Changing skin lesion 09/04/2023   Frequent falls 08/22/2023   Chronic pain syndrome 08/22/2023   Bilateral lower extremity edema 08/06/2023   Back pain 08/06/2023   Claustrophobia    Pernicious anemia 03/14/2023   Ruptured left breast implant 02/13/2023   S/P mastectomy, bilateral 01/18/2023   Genetic testing 12/18/2022   Breast cancer (HCC) 12/08/2022   Family history of breast cancer 12/08/2022   Profound fatigue 12/08/2022   Lumbar post-laminectomy syndrome 06/02/2022   Pseudarthrosis after fusion or arthrodesis 05/19/2022   Primary osteoarthritis of right knee 03/28/2022   Subjective tinnitus of both ears 12/21/2021   Abnormality of gait 04/29/2021   Abnormal sensation in both ears 04/06/2021   Other adverse food reactions, not elsewhere classified, subsequent encounter 04/06/2021   Multiple drug allergies 04/06/2021   Insomnia due to medical condition 02/25/2021   Myofascial muscle pain 02/25/2021   Nerve pain 10/18/2020   Incomplete paraplegia (HCC) 10/18/2020   Orthostatic hypotension 10/18/2020   Renal cyst 09/23/2020   Chronic migraine without aura without status migrainosus, not intractable    Hypokalemia    Adjustment reaction with anxiety and depression    Spinal cord injury, lumbar, without spinal bone injury, sequela 08/18/2020   Paraparesis (HCC)    Post-operative pain    Hypotension    Slow transit constipation    AKI (acute kidney injury)    Right leg weakness 08/11/2020   DDD (degenerative disc disease), lumbar 09/02/2019    Class: Chronic   Degenerative disc disease, lumbar 09/02/2019   Chest tightness 09/23/2018   Bradycardia 09/23/2018   BMI 40.0-44.9, adult (HCC) 04/17/2018   Status post bariatric surgery 04/17/2018   Labile blood pressure 03/08/2018   Chronic low back pain 09/03/2017   History of total hip replacement, left 01/26/2016   Osteoarthritis of spine with radiculopathy, lumbar region 10/16/2014    LEG PAIN, BILATERAL 12/16/2007   Malignant neoplasm of cervix uteri (HCC) 07/02/2007   MORBID OBESITY 07/02/2007   DEPRESSION 07/02/2007   Migraine without aura 07/02/2007   Other allergic rhinitis 07/02/2007   Asthma 07/02/2007   GERD 07/02/2007   ELEVATED BLOOD PRESSURE WITHOUT DIAGNOSIS OF HYPERTENSION 07/02/2007   Asthma 07/02/2007   Past Medical History:  Diagnosis Date   Anemia    Anesthesia complication    a.) PONV; b.) delayed emergence   Anxiety    Asthma    Benign cyst of kidney    Breast cancer (HCC) 01/18/2023   left breast-bil mastectomies   Cervical cancer (HCC)    Chronic back pain    Chronic hypotension    CKD (chronic kidney disease), stage II    Claustrophobia    Decompression injury of spinal cord    Depression    Dizziness    Family history of adverse reaction to anesthesia    a.) delayed emergence in 1st degree female relative (mother)   History of dysplastic nevus 01/18/2018   right shoulder, recurrent dysplastic nevus   History of kidney stones    History of shingles    Hypercholesteremia    Hypothyroidism    Inappropriate sinus node tachycardia    Migraine    Morbid obesity (HCC)    Multiple allergies    Neurogenic orthostatic hypotension (HCC)    Obesity    Osteoarthritis    left hip   Pneumonia    PONV (postoperative nausea and vomiting)    S/P insertion of spinal cord stimulator 01/24/2024  Seizure (HCC) 2021   x1-after a back surgery with spinal cord stimulator   Spinal cord stroke (HCC) 08/11/2020   spinal cord puncture during surgery   Wears glasses     Family History  Problem Relation Age of Onset   Renal Disease Mother    Hypertension Mother    Sudden Cardiac Death Mother    Heart failure Mother    Valvular heart disease Mother    Heart disease Mother    Heart disease Father        Just had stents placed   Stroke Brother    Heart attack Brother 45   Breast cancer Maternal Grandmother    Diabetes Other     Past  Surgical History:  Procedure Laterality Date   ABDOMINAL HYSTERECTOMY     APPENDECTOMY     BACK SURGERY     BILIOPANCREATIC DIVISION W DUODENAL SWITCH N/A    BREAST BIOPSY Left 12/06/2022   u/s bx,1:00 heart clip, path pending   BREAST BIOPSY Left 12/06/2022   us  bx 2:00 ribbon clip path pending   BREAST BIOPSY Left 12/06/2022   US  LT BREAST BX W LOC DEV EA ADD LESION IMG BX SPEC US  GUIDE 12/06/2022 [facility]   BREAST BIOPSY Left 12/06/2022   US  LT BREAST BX W LOC DEV 1ST LESION IMG BX SPEC US  GUIDE 12/06/2022 [facility]   BREAST RECONSTRUCTION WITH PLACEMENT OF TISSUE EXPANDER AND FLEX HD (ACELLULAR HYDRATED DERMIS) Bilateral 01/18/2023   Procedure: IMMEDIATE LEFT BREAST RECONSTRUCTION WITH PLACEMENT OF TISSUE EXPANDER AND FLEX HD (ACELLULAR HYDRATED DERMIS);  Surgeon: [name], DO;  Location: [facility];  Service: Plastics;  Laterality: Bilateral;   BREAST RECONSTRUCTION WITH PLACEMENT OF TISSUE EXPANDER AND FLEX HD (ACELLULAR HYDRATED DERMIS) Left 03/26/2023   Procedure: BREAST RECONSTRUCTION WITH PLACEMENT OF TISSUE EXPANDER;  Surgeon: [name], DO;  Location: [facility];  Service: Plastics;  Laterality: Left;   CHOLECYSTECTOMY     COLONOSCOPY N/A 06/27/2021   Procedure: COLONOSCOPY;  Surgeon: Onita Elspeth Sharper, DO;  Location: [facility]COPY;  Service: Gastroenterology;  Laterality: N/A;   COLONOSCOPY N/A 08/27/2023   Procedure: COLONOSCOPY;  Surgeon: Onita Elspeth Sharper, DO;  Location: [facility] ENDOSCOPY;  Service: Gastroenterology;  Laterality: N/A;   DIAGNOSTIC LAPAROSCOPY     LOA   ESOPHAGOGASTRODUODENOSCOPY N/A 06/27/2021   Procedure: ESOPHAGOGASTRODUODENOSCOPY (EGD);  Surgeon: Onita Elspeth Sharper, DO;  Location: [facility] ENDOSCOPY;  Service: Gastroenterology;  Laterality: N/A;   ESOPHAGOGASTRODUODENOSCOPY (EGD) WITH PROPOFOL  N/A 07/25/2017   Procedure: ESOPHAGOGASTRODUODENOSCOPY (EGD) WITH PROPOFOL ;  Surgeon: Viktoria Lamar DASEN, MD;   Location: [facility] ENDOSCOPY;  Service: Endoscopy;  Laterality: N/A;   HERNIA REPAIR     KNEE ARTHROSCOPY Right    LAPAROSCOPIC GASTRIC SLEEVE RESECTION WITH HIATAL HERNIA REPAIR     LIPOSUCTION WITH LIPOFILLING Bilateral 05/21/2024   Procedure: LIPOSUCTION, WITH FAT TRANSFER;  Surgeon: [name], DO;  Location: [facility];  Service: Plastics;  Laterality: Bilateral;  Bilateral breast fat grafting   LUMBAR FUSION  08/29/2022   Revision Lumbar two through five Laminectomy & Fusion with Transforaminal Lumbar interbody fusion   LUMBAR SPINAL CORD STIMULATOR REVISION Left 06/26/2024   Procedure: Left sided internal pulse generator site revision;  Surgeon: [name] ORN, MD;  Location: [facility];  Service: Neurosurgery;  Laterality: Left;   MASTECTOMY W/ SENTINEL NODE BIOPSY Bilateral 01/18/2023   Procedure: MASTECTOMY WITH SENTINEL LYMPH NODE BIOPSY, RNFA to assist;  Surgeon: Jordis Laneta FALCON, MD;  Location: [facility];  Service: General;  Laterality: Bilateral;   ORIF ANKLE FRACTURE Left 08/13/2024   Procedure: OPEN REDUCTION INTERNAL FIXATION (ORIF) ANKLE FRACTURE; Syndesmosis repair;  Surgeon: Jerri Kay HERO, MD;  Location: [facility];  Service: Orthopedics;  Laterality: Left;  OPEN REDUCTION INTERNAL FIXATION LEFT LATERAL MALLEOLUS FRACTURE, SYNDESMOSIS   PULSE GENERATOR IMPLANT N/A 01/24/2024   Procedure: UNILATERAL PULSE GENERATOR IMPLANT;  Surgeon: [name] ORN, MD;  Location: [facility];  Service: Neurosurgery;  Laterality: N/A;  INTERNAL PULSE GENERATOR PLACEMENT FOR SPINAL CORD STIMULATOR   REMOVAL OF TISSUE EXPANDER AND PLACEMENT OF IMPLANT Bilateral 06/06/2023   Procedure: REMOVAL OF TISSUE EXPANDER AND PLACEMENT OF IMPLANT;  Surgeon: [name], DO;  Location: [facility];  Service: Plastics;  Laterality: Bilateral;   SPINAL CORD STIMULATOR IMPLANT     THORACIC LAMINECTOMY FOR SPINAL CORD STIMULATOR N/A 01/15/2024   Procedure:  THORACIC LAMINECTOMY FOR SPINAL CORD STIMULATOR;  Surgeon: [name] ORN, MD;  Location: [facility];  Service: Neurosurgery;  Laterality: N/A;  THORACIC LAMINECTOMY FOR SPINAL CORD STIMULATOR PADDLE TRIAL   TONSILLECTOMY     TOTAL HIP ARTHROPLASTY Left 01/26/2016   Procedure: LEFT TOTAL HIP ARTHROPLASTY ANTERIOR APPROACH;  Surgeon: Kay HERO Jerri, MD;  Location: [facility];  Service: Orthopedics;  Laterality: Left;   TRANSFORAMINAL LUMBAR INTERBODY FUSION (TLIF) WITH PEDICLE SCREW FIXATION 3 LEVEL  08/2019   Lynwood Better, MD L2-L5   Social History   Occupational History   Not on file  Tobacco Use   Smoking status: Never    Passive exposure: Never   Smokeless tobacco: Never  Vaping Use   Vaping status: Never Used  Substance and Sexual Activity   Alcohol use: Yes    Comment: occasional wine   Drug use: No   Sexual activity: Not Currently    Birth control/protection: Surgical    Comment: Hyst     "

## 2024-08-27 ENCOUNTER — Encounter: Admitting: Podiatry

## 2024-09-11 ENCOUNTER — Encounter: Admitting: Physical Therapy

## 2024-09-12 ENCOUNTER — Encounter: Payer: Self-pay | Admitting: Oncology

## 2024-09-12 ENCOUNTER — Inpatient Hospital Stay: Attending: Oncology | Admitting: Oncology

## 2024-09-12 VITALS — BP 118/82 | HR 84 | Temp 97.4°F | Ht 64.0 in | Wt 249.0 lb

## 2024-09-12 DIAGNOSIS — Z170 Estrogen receptor positive status [ER+]: Secondary | ICD-10-CM | POA: Diagnosis not present

## 2024-09-12 DIAGNOSIS — Z08 Encounter for follow-up examination after completed treatment for malignant neoplasm: Secondary | ICD-10-CM

## 2024-09-12 DIAGNOSIS — C50412 Malignant neoplasm of upper-outer quadrant of left female breast: Secondary | ICD-10-CM | POA: Diagnosis present

## 2024-09-12 DIAGNOSIS — Z1721 Progesterone receptor positive status: Secondary | ICD-10-CM | POA: Diagnosis not present

## 2024-09-12 DIAGNOSIS — Z79899 Other long term (current) drug therapy: Secondary | ICD-10-CM | POA: Diagnosis not present

## 2024-09-12 DIAGNOSIS — Z1732 Human epidermal growth factor receptor 2 negative status: Secondary | ICD-10-CM | POA: Insufficient documentation

## 2024-09-12 DIAGNOSIS — C50912 Malignant neoplasm of unspecified site of left female breast: Secondary | ICD-10-CM | POA: Diagnosis not present

## 2024-09-12 DIAGNOSIS — Z79811 Long term (current) use of aromatase inhibitors: Secondary | ICD-10-CM | POA: Diagnosis not present

## 2024-09-12 DIAGNOSIS — Z9013 Acquired absence of bilateral breasts and nipples: Secondary | ICD-10-CM | POA: Diagnosis not present

## 2024-09-12 NOTE — Progress Notes (Signed)
 "    Hematology/Oncology Consult note [facility]  Telephone:([phone] [phone]  Patient Care Team: Valora Lynwood FALCON, MD as PCP - General (Family Medicine) Perla, Evalene PARAS, MD as PCP - Cardiology (Cardiology) Perla Evalene PARAS, MD as Consulting Physician (Cardiology) [name], DO as Consulting Physician (Neurology) [name], RN as Oncology Nurse Navigator [name] ORN, MD as Consulting Physician (Neurosurgery) Melanee Annah BROCKS, MD as Consulting Physician (Oncology)   Name of the patient: Vanessa Medina  [id number]  [birth year]   Date of visit: 09/12/2024  Diagnosis-  Cancer Staging  Breast cancer [facility]) Staging form: Breast, AJCC 8th Edition - Clinical stage from 12/06/2022: Stage IA (cT1b, cN0, cM0, G2, ER+, PR+, HER2-) - Signed by Agrawal, Kavita, MD on 02/15/2023 Stage prefix: Initial diagnosis Histologic grading system: 3 grade system    Chief complaint/ Reason for visit- routine f/u of breast cancer  Heme/Onc history: Vanessa Medina 54 y.o. female with pmh of Right-sided hemiparesis as complication from permanent spinal stimulator placement, orthostasis on midodrine  and fludrocortisone , hypertension, migraine, GERD, cholecystectomy, lumbar fusion in December 2023, gastric sleeve surgery. Screening mammogram in march 2024 showed 3 masses. Mass 1 at 1:00 3 cm from the nipple measuring 7 x by 8 x 8 mm.  Mass 2 1:30 o'clock 1 cm from nipple measuring 9 x 7 x 9 mm.  Mass 3 at 2:00 1 cm from nipple measuring 6 x 8 x 4 mm.  No suspicious left axillary lymph node 1.  Mass 1 and mass 3 was biopsied consistent with invasive ductal cancer.  Both are strongly expressing ER and PR receptors.  HER2 negative.  Status post bilateral mastectomies with SLNB left axilla and immediate bilateral reconstruction on 01/18/2023.  Pathology showed multifocal disease 3 separate foci- 12 mm with grade 1, 12 mm with grade 2 and 9 mm with grade 2 invasive  mucinous. margins negative, 1/2 LN micrometastatic dz, 0.6 mm deposit, ER/PR >90% positive. HER2 negative.   Oncotype Dx score was 9. Adjuvant chemo was not necessary. She did not require adjuvant RT. She was started on tamoxifen  in June 2024 but stopped it in December 2024 due to worsening lower extremity edema.  She was subsequently switched to letrozole .  Baseline bone density scan normal.   Interval history- Vanessa Medina is a 54 year old female with stage I multifocal left breast cancer with micrometastatic nodal involvement, status post bilateral mastectomy, currently on adjuvant letrozole , who presents for oncology follow-up after recent left ankle fracture and ORIF.  She was diagnosed with three distinct left breast masses, two of which were biopsied and confirmed as breast cancer. She underwent bilateral mastectomy, with oncotype testing of one mass yielding a score of 9, and did not require chemotherapy or radiation. She was initially started on tamoxifen  but developed significant lower extremity edema, prompting a switch to letrozole , which she tolerates well without major adverse effects. She reports that she continues to take letrozole , calcium , and vitamin D supplementation. Her most recent bone density scan was normal, though her orthopedic surgeon recently described her bone quality as real soft.  On December 1st, she sustained a fall at home when her right knee, which is bone-on-bone and awaiting replacement, gave out, resulting in a twisting injury to her left leg and a left ankle fracture. She sustained fractures of the left tibia and fibula, requiring open reduction and internal fixation with plates, screws, and a long pin on December 10th. She has been non-weight  bearing for over a month, using a wheelchair and scooter for mobility, and removes her immobilization device twice daily for prescribed ankle exercises. She reports significant fatigue and poor sleep due to  immobilization and inability to change position at night. She describes marked limitations in daily activities and is unable to leave the house except for medical appointments.  She receives monthly B12 injections for deficiency related to prior gastric sleeve surgery, though she has missed recent doses due to her injury. She is unsure if her iron  levels are being monitored but denies symptoms of iron  deficiency. She is also maintained on levothyroxine for hypothyroidism. She lives with her father, who assists with her care.  She has not experienced any new or concerning symptoms related to her breast cancer or letrozole  therapy.     ECOG PS- 2 Pain scale- 4 Opioid associated constipation- no  Review of systems- Review of Systems  Constitutional:  Negative for chills, fever, malaise/fatigue and weight loss.  HENT:  Negative for congestion, ear discharge and nosebleeds.   Eyes:  Negative for blurred vision.  Respiratory:  Negative for cough, hemoptysis, sputum production, shortness of breath and wheezing.   Cardiovascular:  Negative for chest pain, palpitations, orthopnea and claudication.  Gastrointestinal:  Negative for abdominal pain, blood in stool, constipation, diarrhea, heartburn, melena, nausea and vomiting.  Genitourinary:  Negative for dysuria, flank pain, frequency, hematuria and urgency.  Musculoskeletal:  Positive for joint pain. Negative for back pain and myalgias.  Skin:  Negative for rash.  Neurological:  Negative for dizziness, tingling, focal weakness, seizures, weakness and headaches.  Endo/Heme/Allergies:  Does not bruise/bleed easily.  Psychiatric/Behavioral:  Negative for depression and suicidal ideas. The patient does not have insomnia.       Allergies[1]   Past Medical History:  Diagnosis Date   Anemia    Anesthesia complication    a.) PONV; b.) delayed emergence   Anxiety    Asthma    Benign cyst of kidney    Breast cancer (HCC) 01/18/2023   left  breast-bil mastectomies   Cervical cancer (HCC)    Chronic back pain    Chronic hypotension    CKD (chronic kidney disease), stage II    Claustrophobia    Decompression injury of spinal cord    Depression    Dizziness    Family history of adverse reaction to anesthesia    a.) delayed emergence in 1st degree female relative (mother)   History of dysplastic nevus 01/18/2018   right shoulder, recurrent dysplastic nevus   History of kidney stones    History of shingles    Hypercholesteremia    Hypothyroidism    Inappropriate sinus node tachycardia    Migraine    Morbid obesity (HCC)    Multiple allergies    Neurogenic orthostatic hypotension (HCC)    Obesity    Osteoarthritis    left hip   Pneumonia    PONV (postoperative nausea and vomiting)    S/P insertion of spinal cord stimulator 01/24/2024   Seizure (HCC) 2021   x1-after a back surgery with spinal cord stimulator   Spinal cord stroke (HCC) 08/11/2020   spinal cord puncture during surgery   Wears glasses      Past Surgical History:  Procedure Laterality Date   ABDOMINAL HYSTERECTOMY     APPENDECTOMY     BACK SURGERY     BILIOPANCREATIC DIVISION W DUODENAL SWITCH N/A    BREAST BIOPSY Left 12/06/2022   u/s bx,1:00 heart clip, path  pending   BREAST BIOPSY Left 12/06/2022   us  bx 2:00 ribbon clip path pending   BREAST BIOPSY Left 12/06/2022   US  LT BREAST BX W LOC DEV EA ADD LESION IMG BX SPEC US  GUIDE 12/06/2022 [facility]   BREAST BIOPSY Left 12/06/2022   US  LT BREAST BX W LOC DEV 1ST LESION IMG BX SPEC US  GUIDE 12/06/2022 [facility]   BREAST RECONSTRUCTION WITH PLACEMENT OF TISSUE EXPANDER AND FLEX HD (ACELLULAR HYDRATED DERMIS) Bilateral 01/18/2023   Procedure: IMMEDIATE LEFT BREAST RECONSTRUCTION WITH PLACEMENT OF TISSUE EXPANDER AND FLEX HD (ACELLULAR HYDRATED DERMIS);  Surgeon: [name], DO;  Location: [facility];  Service: Plastics;  Laterality: Bilateral;   BREAST RECONSTRUCTION WITH  PLACEMENT OF TISSUE EXPANDER AND FLEX HD (ACELLULAR HYDRATED DERMIS) Left 03/26/2023   Procedure: BREAST RECONSTRUCTION WITH PLACEMENT OF TISSUE EXPANDER;  Surgeon: [name], DO;  Location: [facility];  Service: Plastics;  Laterality: Left;   CHOLECYSTECTOMY     COLONOSCOPY N/A 06/27/2021   Procedure: COLONOSCOPY;  Surgeon: Onita Elspeth Sharper, DO;  Location: [facility];  Service: Gastroenterology;  Laterality: N/A;   COLONOSCOPY N/A 08/27/2023   Procedure: COLONOSCOPY;  Surgeon: Onita Elspeth Sharper, DO;  Location: [facility]OSCOPY;  Service: Gastroenterology;  Laterality: N/A;   DIAGNOSTIC LAPAROSCOPY     LOA   ESOPHAGOGASTRODUODENOSCOPY N/A 06/27/2021   Procedure: ESOPHAGOGASTRODUODENOSCOPY (EGD);  Surgeon: Onita Elspeth Sharper, DO;  Location: [facility] ENDOSCOPY;  Service: Gastroenterology;  Laterality: N/A;   ESOPHAGOGASTRODUODENOSCOPY (EGD) WITH PROPOFOL  N/A 07/25/2017   Procedure: ESOPHAGOGASTRODUODENOSCOPY (EGD) WITH PROPOFOL ;  Surgeon: Viktoria Lamar DASEN, MD;  Location: [facility];  Service: Endoscopy;  Laterality: N/A;   HERNIA REPAIR     KNEE ARTHROSCOPY Right    LAPAROSCOPIC GASTRIC SLEEVE RESECTION WITH HIATAL HERNIA REPAIR     LIPOSUCTION WITH LIPOFILLING Bilateral 05/21/2024   Procedure: LIPOSUCTION, WITH FAT TRANSFER;  Surgeon: [name], DO;  Location: [facility];  Service: Plastics;  Laterality: Bilateral;  Bilateral breast fat grafting   LUMBAR FUSION  08/29/2022   Revision Lumbar two through five Laminectomy & Fusion with Transforaminal Lumbar interbody fusion   LUMBAR SPINAL CORD STIMULATOR REVISION Left 06/26/2024   Procedure: Left sided internal pulse generator site revision;  Surgeon: [name] ORN, MD;  Location: [facility];  Service: Neurosurgery;  Laterality: Left;   MASTECTOMY W/ SENTINEL NODE BIOPSY Bilateral 01/18/2023   Procedure: MASTECTOMY WITH SENTINEL LYMPH NODE BIOPSY, RNFA to assist;  Surgeon: Jordis Laneta FALCON, MD;  Location: [facility];  Service: General;  Laterality: Bilateral;   ORIF ANKLE FRACTURE Left 08/13/2024   Procedure: OPEN REDUCTION INTERNAL FIXATION (ORIF) ANKLE FRACTURE; Syndesmosis repair;  Surgeon: Jerri Kay HERO, MD;  Location: [facility];  Service: Orthopedics;  Laterality: Left;  OPEN REDUCTION INTERNAL FIXATION LEFT LATERAL MALLEOLUS FRACTURE, SYNDESMOSIS   PULSE GENERATOR IMPLANT N/A 01/24/2024   Procedure: UNILATERAL PULSE GENERATOR IMPLANT;  Surgeon: [name] ORN, MD;  Location: [facility];  Service: Neurosurgery;  Laterality: N/A;  INTERNAL PULSE GENERATOR PLACEMENT FOR SPINAL CORD STIMULATOR   REMOVAL OF TISSUE EXPANDER AND PLACEMENT OF IMPLANT Bilateral 06/06/2023   Procedure: REMOVAL OF TISSUE EXPANDER AND PLACEMENT OF IMPLANT;  Surgeon: [name], DO;  Location:  SURGERY CENTER;  Service: Plastics;  Laterality: Bilateral;   SPINAL CORD STIMULATOR IMPLANT     THORACIC LAMINECTOMY FOR SPINAL CORD STIMULATOR N/A 01/15/2024   Procedure: THORACIC LAMINECTOMY FOR SPINAL CORD STIMULATOR;  Surgeon: [name] ORN, MD;  Location:  [facility];  Service: Neurosurgery;  Laterality: N/A;  THORACIC LAMINECTOMY FOR SPINAL CORD STIMULATOR PADDLE TRIAL   TONSILLECTOMY     TOTAL HIP ARTHROPLASTY Left 01/26/2016   Procedure: LEFT TOTAL HIP ARTHROPLASTY ANTERIOR APPROACH;  Surgeon: Kay CHRISTELLA Cummins, MD;  Location: [facility];  Service: Orthopedics;  Laterality: Left;   TRANSFORAMINAL LUMBAR INTERBODY FUSION (TLIF) WITH PEDICLE SCREW FIXATION 3 LEVEL  08/2019   Lynwood Better, MD L2-L5    Social History   Socioeconomic History   Marital status: Divorced    Spouse name: Not on file   Number of children: 1   Years of education: Not on file   Highest education level: Not on file  Occupational History   Not on file  Tobacco Use   Smoking status: Never    Passive exposure: Never   Smokeless tobacco: Never  Vaping Use   Vaping status: Never Used  Substance and  Sexual Activity   Alcohol use: Yes    Comment: occasional wine   Drug use: No   Sexual activity: Not Currently    Birth control/protection: Surgical    Comment: Hyst  Other Topics Concern   Not on file  Social History Narrative   Right handed    Uses cane to walk    Son has Medical and legal POA.   Social Drivers of Health   Tobacco Use: Low Risk (09/12/2024)   Patient History    Smoking Tobacco Use: Never    Smokeless Tobacco Use: Never    Passive Exposure: Never  Financial Resource Strain: Low Risk  (10/31/2023)   Received from [facility]   Overall Financial Resource Strain (CARDIA)    Difficulty of Paying Living Expenses: Not very hard  Food Insecurity: No Food Insecurity (10/31/2023)   Received from [facility]   Epic    Within the past 12 months, you worried that your food would run out before you got the money to buy more.: Never true    Within the past 12 months, the food you bought just didn't last and you didn't have money to get more.: Never true  Transportation Needs: No Transportation Needs (10/31/2023)   Received from [facility] - Transportation    In the past 12 months, has lack of transportation kept you from medical appointments or from getting medications?: No    Lack of Transportation (Non-Medical): No  Physical Activity: Inactive (12/11/2022)   Exercise Vital Sign    Days of Exercise per Week: 0 days    Minutes of Exercise per Session: 0 min  Stress: Stress Concern Present (12/11/2022)   [employer] of [employer]ational [employer] - Occupational Stress Questionnaire    Feeling of Stress : To some extent  Social Connections: Moderately Isolated (12/11/2022)   Social Connection and Isolation Panel    Frequency of Communication with Friends and Family: Three times a week    Frequency of Social Gatherings with Friends and Family: Twice a week    Attends Religious Services: 1 to 4 times per year    Active  Member of [employer] or Organizations: No    Attends Banker Meetings: Never    Marital Status: Divorced  Catering Manager Violence: Not At Risk (01/18/2023)   Humiliation, Afraid, Rape, and Kick questionnaire    Fear of Current or Ex-Partner: No    Emotionally Abused: No    Physically Abused: No    Sexually Abused: No  Depression (PHQ2-9): Low  Risk (09/12/2024)   Depression (PHQ2-9)    PHQ-2 Score: 1  Alcohol Screen: Low Risk (12/11/2022)   Alcohol Screen    Last Alcohol Screening Score (AUDIT): 1  Housing: Unknown (10/31/2023)   Received from [facility]   Epic    In the last 12 months, was there a time when you were not able to pay the mortgage or rent on time?: No    Number of Times Moved in the Last Year: Not on file    At any time in the past 12 months, were you homeless or living in a shelter (including now)?: No  Utilities: Not At Risk (10/31/2023)   Received from [facility] Utilities    Threatened with loss of utilities: No  Health Literacy: Not on file    Family History  Problem Relation Age of Onset   Renal Disease Mother    Hypertension Mother    Sudden Cardiac Death Mother    Heart failure Mother    Valvular heart disease Mother    Heart disease Mother    Heart disease Father        Just had stents placed   Stroke Brother    Heart attack Brother 19   Breast cancer Maternal Grandmother    Diabetes Other     Current Medications[2]  Physical exam:  Vitals:   09/12/24 1255  BP: 118/82  Pulse: 84  Temp: (!) 97.4 F (36.3 C)  TempSrc: Tympanic  SpO2: 99%  Weight: 249 lb (112.9 kg)  Height: 5' 4 (1.626 m)   Physical Exam Cardiovascular:     Rate and Rhythm: Normal rate and regular rhythm.     Heart sounds: Normal heart sounds.  Pulmonary:     Effort: Pulmonary effort is normal.     Breath sounds: Normal breath sounds.  Musculoskeletal:     Comments: Brace in place over left lower extremity  Skin:     General: Skin is warm and dry.  Neurological:     Mental Status: She is alert and oriented to person, place, and time.      I have personally reviewed labs listed below:    Latest Ref Rng & Units 03/06/2024    9:52 AM  CMP  Glucose 70 - 99 mg/dL 887   BUN 6 - 20 mg/dL 14   Creatinine 9.55 - 1.00 mg/dL 8.85   Sodium 864 - 854 mmol/L 140   Potassium 3.5 - 5.1 mmol/L 3.3   Chloride 98 - 111 mmol/L 114   CO2 22 - 32 mmol/L 19   Calcium  8.9 - 10.3 mg/dL 8.9   Total Protein 6.5 - 8.1 g/dL 7.1   Total Bilirubin 0.0 - 1.2 mg/dL 0.6   Alkaline Phos 38 - 126 U/L 91   AST 15 - 41 U/L 26   ALT 0 - 44 U/L 17       Latest Ref Rng & Units 03/06/2024    9:52 AM  CBC  WBC 4.0 - 10.5 K/uL 3.5   Hemoglobin 12.0 - 15.0 g/dL 88.0   Hematocrit 63.9 - 46.0 % 37.7   Platelets 150 - 400 K/uL 278    I have personally reviewed Radiology images listed below: No images are attached to the encounter.  XR Ankle Complete Left Result Date: 08/26/2024 X-rays of the left ankle show stable alignment and fixation of a bimalleolar ankle fracture and syndesmosis    Assessment and plan- Patient is a 54  y.o. female with history of stage Ia ER/PR positive HER2 negative left breast cancer s/p bilateral mastectomy presently on letrozole  here for routine follow-up  Assessment and Plan    Breast cancer, status post bilateral mastectomy with ongoing adjuvant letrozole  therapy Stage I, multifocal left breast cancer with one lymph node micrometastatic disease, post-mastectomy. Discontinued tamoxifen  due to edema, tolerating letrozole  well. Very low recurrence risk, no routine imaging needed. - Continue letrozole  therapy. - Defer chest wall exam until cast removal; perform at next visit. - No routine imaging required for surveillance. - Follow up in four months.  Osteopenia risk due to aromatase inhibitor therapy Normal bone density scan but decreased bone strength and recent fracture. - Continue calcium  and  vitamin D supplementation. - Repeat bone density scan every two years.  Hypothyroidism Well-managed on levothyroxine. - Continue levothyroxine therapy.  Vitamin B12 deficiency Deficiency likely due to prior gastric sleeve surgery.  - Continue monthly vitamin B12 injections through primary care. - Encourage resumption of regular primary care follow-up for B12 monitoring.         Visit Diagnosis 1. High risk medication use   2. Encounter for follow-up surveillance of breast cancer   3. Use of letrozole  (Femara )      Dr. Annah Skene, MD, MPH [facility] [phone] 09/12/2024 1:35 PM                   [1]  Allergies Allergen Reactions   Bee Venom Swelling   Iodinated Contrast Media Anaphylaxis    Swelling  Other reaction(s): Other (See Comments)  Swelling   Swelling   Latex Hives and Rash   Morphine  Other (See Comments)    BRADYCARDIA   Shellfish Allergy Anaphylaxis   Codeine  Nausea And Vomiting   Meperidine Hcl Other (See Comments)    BRADYCARDIA   Bee Pollen Other (See Comments)    Unknown   Oxycodone  Hives and Itching    Blisters on back   Tape Hives    Adhesive   Pentazocine Lactate Nausea And Vomiting    REACTION: vomiting with Talwin NX   Propoxyphene Nausea Only and Nausea And Vomiting  [2]  Current Outpatient Medications:    aspirin  EC 81 MG tablet, Take 1 tablet (81 mg total) by mouth 2 (two) times daily., Disp: 84 tablet, Rfl: 0   azelastine (ASTELIN) 0.1 % nasal spray, Place 1 spray into both nostrils 2 (two) times daily as needed for allergies. Use in each nostril as directed, Disp: , Rfl:    baclofen  (LIORESAL ) 10 MG tablet, Take 1 tablet (10 mg total) by mouth 3 (three) times daily. - for spasms, Disp: 270 tablet, Rfl: 1   Calcium  Carb-Cholecalciferol (CALCIUM +D3 PO), Take 1 tablet by mouth in the morning and at bedtime., Disp: , Rfl:    clobetasol  ointment (TEMOVATE ) 0.05 %, Apply 1 gram topically to  affected area of skin twice daily. Stop once resolved and restart as needed for flares. Avoid use on face, armpits, groin unless otherwise indicated., Disp: 60 g, Rfl: 1   colestipol (COLESTID) 1 g tablet, Take 1 g by mouth at bedtime., Disp: , Rfl:    diclofenac  Sodium (VOLTAREN ) 1 % GEL, Apply 2 g topically 4 (four) times daily as needed (pain)., Disp: 300 g, Rfl: 1   DULoxetine  (CYMBALTA ) 60 MG capsule, Take 2 capsules (120 mg total) by mouth at bedtime., Disp: 180 capsule, Rfl: 3   Dupilumab  (DUPIXENT ) 300 MG/2ML SOAJ, Inject 300 mg into the  skin every 14 (fourteen) days. Starting at day 15 for maintenance., Disp: 4 mL, Rfl: 5   EPINEPHrine  0.3 mg/0.3 mL IJ SOAJ injection, Inject 0.3 mg into the muscle as needed for anaphylaxis., Disp: , Rfl:    fluticasone  (FLONASE ) 50 MCG/ACT nasal spray, Place 2 sprays into both nostrils daily as needed for allergies., Disp: , Rfl:    HYDROcodone -acetaminophen  (NORCO/VICODIN) 5-325 MG tablet, Take 1 tablet by mouth every 6 (six) hours as needed for moderate pain (pain score 4-6)., Disp: 30 tablet, Rfl: 0   HYDROcodone -acetaminophen  (NORCO/VICODIN) 5-325 MG tablet, Take 1 tablet by mouth every 4 (four) hours as needed., Disp: 15 tablet, Rfl: 0   HYDROcodone -acetaminophen  (NORCO/VICODIN) 5-325 MG tablet, Take 1 tablet by mouth 3 (three) times daily as needed., Disp: 21 tablet, Rfl: 0   hydrOXYzine  (ATARAX ) 10 MG tablet, Take 1 tablet (10 mg total) by mouth 3 (three) times daily as needed., Disp: 30 tablet, Rfl: 0   letrozole  (FEMARA ) 2.5 MG tablet, Take 2.5 mg by mouth daily., Disp: , Rfl:    levothyroxine (SYNTHROID) 25 MCG tablet, Take 25 mcg by mouth daily before breakfast., Disp: , Rfl:    lidocaine  (LIDODERM ) 5 %, Place 1 patch onto the skin daily for 18 days. Remove & Discard patch within 12 hours or as directed by MD, Disp: 9 patch, Rfl: 1   montelukast  (SINGULAIR ) 10 MG tablet, TAKE 1 TABLET BY MOUTH AT  BEDTIME FOR ALLERGIES, Disp: 30 tablet, Rfl: 1    Multiple Vitamin (MULTIVITAMIN WITH MINERALS) TABS tablet, Take 1 tablet by mouth 2 (two) times daily., Disp: , Rfl:    mupirocin  ointment (BACTROBAN ) 2 %, Apply 1 Application topically 2 (two) times daily., Disp: 22 g, Rfl: 0   pramipexole  (MIRAPEX ) 0.125 MG tablet, Take 0.5 mg by mouth at bedtime., Disp: , Rfl:    pyridOXINE (VITAMIN B-6) 100 MG tablet, Take 1 tablet (100 mg total) by mouth daily., Disp: 90 tablet, Rfl: 1   thiamine 250 MG tablet, Take 250 mg by mouth at bedtime., Disp: , Rfl:    tolterodine  (DETROL  LA) 4 MG 24 hr capsule, Take 1 capsule (4 mg total) by mouth every evening., Disp: 90 capsule, Rfl: 3   topiramate  (TOPAMAX ) 100 MG tablet, Take 1 tablet (100 mg total) by mouth 2 (two) times daily. Take 1 tab BID- but if you cannot tolerate, take 200 mg at bedtime- for headache prevention, Disp: 180 tablet, Rfl: 1   vitamin B-12 (CYANOCOBALAMIN ) 1000 MCG tablet, Take 1 tablet (1,000 mcg total) by mouth daily., Disp: 90 tablet, Rfl: 1  "

## 2024-09-17 ENCOUNTER — Encounter: Admitting: Podiatry

## 2024-09-18 ENCOUNTER — Encounter: Admitting: Physical Therapy

## 2024-09-19 ENCOUNTER — Telehealth: Payer: Self-pay

## 2024-09-19 ENCOUNTER — Other Ambulatory Visit: Payer: Self-pay | Admitting: Oncology

## 2024-09-19 ENCOUNTER — Other Ambulatory Visit: Payer: Self-pay | Admitting: *Deleted

## 2024-09-19 NOTE — Telephone Encounter (Signed)
 Submitted prior auth request for 5% Lidocaine  patches on covermymeds.   Iness Ausborn (Key: [id number]) PA Case ID #: [id number] Rx #: [id number]  Awaiting response.

## 2024-09-22 ENCOUNTER — Encounter: Admitting: Physical Medicine and Rehabilitation

## 2024-09-22 NOTE — Telephone Encounter (Signed)
 Sent copy of denial letter to pharmacy. Notified patient via mychart.

## 2024-09-22 NOTE — Telephone Encounter (Signed)
 Per denial letter on Covermymeds portal: Request for Lidocaine  patches has been denied (patient had different insurance the last time it was filled).  Coverage of your drug was denied We denied coverage under Medicare Part D for the following drug(s) you or your prescribing provider asked  for: LIDOCAINE  Patch  Why was coverage for this drug denied? We denied coverage for this drug because: The information provided by your prescriber did not meet the  requirements for covering this medication (prior authorization).  Your plan does not allow coverage of this medication based on your prescriber answering No to the  following question(s): 1. Is the requested drug being prescribed for pain associated with post-herpetic neuralgia (PHN)? 2. Is the requested drug being prescribed for pain associated with diabetic neuropathy? 3. Is the requested drug being prescribed for pain associated with cancer-related neuropathy (including  treatment-related neuropathy [e.g., neuropathy associated with radiation treatment or chemotherapy])?   If needed, an e-appeal can be filed via Covermymeds  You have the right to ask us  to review our decision by asking us  for an appeal within 65 calendar  days of the date of this notice. If you ask for an appeal after 65 days, you must explain why your  appeal is late. You or your prescribing provider have the right to ask us  for a special type of appeal called an exception. Your prescribing provider must provide a statement to support your exception request.  Examples of an exception are: ? Formulary exception: you need a drug thats not on our list of our covered drugs (formulary). ? Coverage rule exception: you think a coverage rule (like prior authorization or a quantity  limit) shouldnt apply to you for medical reasons. ? Tiering Exception: you need to take a non-preferred drug thats on a higher cost- sharing tier,  and you want our plan to cover the drug at a  lower cost-sharing amount. Standard appeal: youll get a written decision within 7 days (or 14 days if your appeal is about a  payment for a drug you already received).

## 2024-09-23 ENCOUNTER — Ambulatory Visit: Admitting: Physician Assistant

## 2024-09-23 ENCOUNTER — Other Ambulatory Visit: Payer: Self-pay

## 2024-09-23 DIAGNOSIS — M1711 Unilateral primary osteoarthritis, right knee: Secondary | ICD-10-CM

## 2024-09-23 DIAGNOSIS — S8262XA Displaced fracture of lateral malleolus of left fibula, initial encounter for closed fracture: Secondary | ICD-10-CM

## 2024-09-23 MED ORDER — LIDOCAINE HCL 1 % IJ SOLN
2.0000 mL | INTRAMUSCULAR | Status: AC | PRN
  Administered 2024-09-23: 2 mL

## 2024-09-23 MED ORDER — METHYLPREDNISOLONE ACETATE 40 MG/ML IJ SUSP
40.0000 mg | INTRAMUSCULAR | Status: AC | PRN
  Administered 2024-09-23: 40 mg via INTRA_ARTICULAR

## 2024-09-23 MED ORDER — BUPIVACAINE HCL 0.25 % IJ SOLN
2.0000 mL | INTRAMUSCULAR | Status: AC | PRN
  Administered 2024-09-23: 2 mL via INTRA_ARTICULAR

## 2024-09-23 NOTE — Progress Notes (Signed)
 "  Post-Op Visit Note   Patient: Vanessa Medina           Date of Birth: 1970-10-30           MRN: [id number] Visit Date: 09/23/2024 PCP: Valora Lynwood FALCON, MD   Assessment & Plan:  Chief Complaint:  Chief Complaint  Patient presents with   Left Ankle - Follow-up    ORIF left ankle 08/13/2024   Visit Diagnoses:  1. Closed displaced fracture of lateral malleolus of left fibula, initial encounter   2. Primary osteoarthritis of right knee     Plan: Patient is a pleasant 54 year old female who comes in today 6 weeks status post ORIF left ankle fracture with syndesmosis fixation.  She is doing better but still having occasional sharp pains throughout the ankle.  She has been compliant nonweightbearing in a cam boot.  She has been taking a baby aspirin  twice daily for DVT prophylaxis.  Unfortunately, she has started having more pain to the right knee where she has known arthritis.  She has not had a recent cortisone injection and would like 1 today if possible.  Examination of the left ankle: Well-healed surgical incision.  She does have 2 small scabs to the proximal aspect.  No signs of infection or cellulitis.  Calf soft nontender.  She is neurovascularly intact distally.  In regards to the left ankle, she may transition to touchdown weightbearing with a cam boot.  I will have her continue with the baby aspirin  until she is full weightbearing in 4 weeks.  Have sent in a referral for outpatient PT.  Follow-up in 4 weeks for repeat evaluation and three-view x-rays of the left ankle.  In regards to the right knee, we have discussed proceeding with cortisone injection today for which she is agreeable to.  She will follow-up as needed for her knee pain.  Follow-Up Instructions: Return in about 4 weeks (around 10/21/2024).   Orders:  Orders Placed This Encounter  Procedures   Large Joint Inj   XR Ankle Complete Left   Ambulatory referral to Physical Therapy   No orders of the defined types  were placed in this encounter.   Procedure Note  Patient: Vanessa Medina             Date of Birth: 12-13-1970           MRN: [id number]             Visit Date: 09/23/2024  Procedures: Visit Diagnoses:  1. Closed displaced fracture of lateral malleolus of left fibula, initial encounter   2. Primary osteoarthritis of right knee     Large Joint Inj on 09/23/2024 1:10 PM Medications: 2 mL lidocaine  1 %; 2 mL bupivacaine  0.25 %; 40 mg methylPREDNISolone  acetate 40 MG/ML       No results found.  PMFS History: Patient Active Problem List   Diagnosis Date Noted   Bimalleolar fracture of left ankle, closed, initial encounter 08/13/2024   Closed displaced fracture of lateral malleolus of left fibula 08/12/2024   Syndesmotic disruption of ankle, left, initial encounter 08/12/2024   Spinal cord stimulator dysfunction, sequela 06/12/2024   Breast asymmetry following reconstructive surgery 03/25/2024   S/P insertion of spinal cord stimulator 03/21/2024   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   Pain and swelling of lower leg 09/25/2023   Lymphedema 09/25/2023   Long term (current) use of aromatase inhibitors 09/07/2023   At risk for loss of bone density 09/07/2023   Changing  skin lesion 09/04/2023   Frequent falls 08/22/2023   Chronic pain syndrome 08/22/2023   Bilateral lower extremity edema 08/06/2023   Back pain 08/06/2023   Claustrophobia    Pernicious anemia 03/14/2023   Ruptured left breast implant 02/13/2023   S/P mastectomy, bilateral 01/18/2023   Genetic testing 12/18/2022   Breast cancer (HCC) 12/08/2022   Family history of breast cancer 12/08/2022   Profound fatigue 12/08/2022   Lumbar post-laminectomy syndrome 06/02/2022   Pseudarthrosis after fusion or arthrodesis 05/19/2022   Primary osteoarthritis of right knee 03/28/2022   Subjective tinnitus of both ears 12/21/2021   Abnormality of gait 04/29/2021   Abnormal sensation in both ears 04/06/2021   Other  adverse food reactions, not elsewhere classified, subsequent encounter 04/06/2021   Multiple drug allergies 04/06/2021   Insomnia due to medical condition 02/25/2021   Myofascial muscle pain 02/25/2021   Nerve pain 10/18/2020   Incomplete paraplegia (HCC) 10/18/2020   Orthostatic hypotension 10/18/2020   Renal cyst 09/23/2020   Chronic migraine without aura without status migrainosus, not intractable    Hypokalemia    Adjustment reaction with anxiety and depression    Spinal cord injury, lumbar, without spinal bone injury, sequela 08/18/2020   Paraparesis (HCC)    Post-operative pain    Hypotension    Slow transit constipation    AKI (acute kidney injury)    Right leg weakness 08/11/2020   DDD (degenerative disc disease), lumbar 09/02/2019    Class: Chronic   Degenerative disc disease, lumbar 09/02/2019   Chest tightness 09/23/2018   Bradycardia 09/23/2018   BMI 40.0-44.9, adult (HCC) 04/17/2018   Status post bariatric surgery 04/17/2018   Labile blood pressure 03/08/2018   Chronic low back pain 09/03/2017   History of total hip replacement, left 01/26/2016   Osteoarthritis of spine with radiculopathy, lumbar region 10/16/2014   LEG PAIN, BILATERAL 12/16/2007   Malignant neoplasm of cervix uteri (HCC) 07/02/2007   MORBID OBESITY 07/02/2007   DEPRESSION 07/02/2007   Migraine without aura 07/02/2007   Other allergic rhinitis 07/02/2007   Asthma 07/02/2007   GERD 07/02/2007   ELEVATED BLOOD PRESSURE WITHOUT DIAGNOSIS OF HYPERTENSION 07/02/2007   Asthma 07/02/2007   Past Medical History:  Diagnosis Date   Anemia    Anesthesia complication    a.) PONV; b.) delayed emergence   Anxiety    Asthma    Benign cyst of kidney    Breast cancer (HCC) 01/18/2023   left breast-bil mastectomies   Cervical cancer (HCC)    Chronic back pain    Chronic hypotension    CKD (chronic kidney disease), stage II    Claustrophobia    Decompression injury of spinal cord    Depression     Dizziness    Family history of adverse reaction to anesthesia    a.) delayed emergence in 1st degree female relative (mother)   History of dysplastic nevus 01/18/2018   right shoulder, recurrent dysplastic nevus   History of kidney stones    History of shingles    Hypercholesteremia    Hypothyroidism    Inappropriate sinus node tachycardia    Migraine    Morbid obesity (HCC)    Multiple allergies    Neurogenic orthostatic hypotension (HCC)    Obesity    Osteoarthritis    left hip   Pneumonia    PONV (postoperative nausea and vomiting)    S/P insertion of spinal cord stimulator 01/24/2024   Seizure (HCC) 2021   x1-after a back surgery with  spinal cord stimulator   Spinal cord stroke (HCC) 08/11/2020   spinal cord puncture during surgery   Wears glasses     Family History  Problem Relation Age of Onset   Renal Disease Mother    Hypertension Mother    Sudden Cardiac Death Mother    Heart failure Mother    Valvular heart disease Mother    Heart disease Mother    Heart disease Father        Just had stents placed   Stroke Brother    Heart attack Brother 68   Breast cancer Maternal Grandmother    Diabetes Other     Past Surgical History:  Procedure Laterality Date   ABDOMINAL HYSTERECTOMY     APPENDECTOMY     BACK SURGERY     BILIOPANCREATIC DIVISION W DUODENAL SWITCH N/A    BREAST BIOPSY Left 12/06/2022   u/s bx,1:00 heart clip, path pending   BREAST BIOPSY Left 12/06/2022   us  bx 2:00 ribbon clip path pending   BREAST BIOPSY Left 12/06/2022   US  LT BREAST BX W LOC DEV EA ADD LESION IMG BX SPEC US  GUIDE 12/06/2022 [facility]   BREAST BIOPSY Left 12/06/2022   US  LT BREAST BX W LOC DEV 1ST LESION IMG BX SPEC US  GUIDE 12/06/2022 [facility]   BREAST RECONSTRUCTION WITH PLACEMENT OF TISSUE EXPANDER AND FLEX HD (ACELLULAR HYDRATED DERMIS) Bilateral 01/18/2023   Procedure: IMMEDIATE LEFT BREAST RECONSTRUCTION WITH PLACEMENT OF TISSUE EXPANDER AND FLEX HD (ACELLULAR  HYDRATED DERMIS);  Surgeon: [name], DO;  Location: [facility];  Service: Plastics;  Laterality: Bilateral;   BREAST RECONSTRUCTION WITH PLACEMENT OF TISSUE EXPANDER AND FLEX HD (ACELLULAR HYDRATED DERMIS) Left 03/26/2023   Procedure: BREAST RECONSTRUCTION WITH PLACEMENT OF TISSUE EXPANDER;  Surgeon: [name], DO;  Location: [facility];  Service: Plastics;  Laterality: Left;   CHOLECYSTECTOMY     COLONOSCOPY N/A 06/27/2021   Procedure: COLONOSCOPY;  Surgeon: Onita Elspeth Sharper, DO;  Location: [facility] END[facility]COPY;  Service: Gastroenterology;  Laterality: N/A;   COLONOSCOPY N/A 08/27/2023   Procedure: COLONOSCOPY;  Surgeon: Onita Elspeth Sharper, DO;  Location: [facility]OSCOPY;  Service: Gastroenterology;  Laterality: N/A;   DIAGNOSTIC LAPAROSCOPY     LOA   ESOPHAGOGASTRODUODENOSCOPY N/A 06/27/2021   Procedure: ESOPHAGOGASTRODUODENOSCOPY (EGD);  Surgeon: Onita Elspeth Sharper, DO;  Location[facility] ENDOSCOPY;  Service: Gastroenterology;  Laterality: N/A;   ESOPHAGOGASTRODUODENOSCOPY (EGD) WITH PROPOFOL  N/A 07/25/2017   Procedure: ESOPHAGOGASTRODUODENOSCOPY (EGD) WITH PROPOFOL ;  Surgeon: Viktoria Lamar DASEN, MD;  Location: [facility] ENDOSCOPY;  Service: Endoscopy;  Laterality: N/A;   HERNIA REPAIR     KNEE ARTHROSCOPY Right    LAPAROSCOPIC GASTRIC SLEEVE RESECTION WITH HIATAL HERNIA REPAIR     LIPOSUCTION WITH LIPOFILLING Bilateral 05/21/2024   Procedure: LIPOSUCTION, WITH FAT TRANSFER;  Surgeon: [name], DO;  Location:  SURGERY CENTER;  Service: Plastics;  Laterality: Bilateral;  Bilateral breast fat grafting   LUMBAR FUSION  08/29/2022   Revision Lumbar two through five Laminectomy & Fusion with Transforaminal Lumbar interbody fusion   LUMBAR SPINAL CORD STIMULATOR REVISION Left 06/26/2024   Procedure: Left sided internal pulse generator site revision;  Surgeon: [name] ORN, MD;  Location: [facility];  Service: Neurosurgery;  Laterality: Left;    MASTECTOMY W/ SENTINEL NODE BIOPSY Bilateral 01/18/2023   Procedure: MASTECTOMY WITH SENTINEL LYMPH NODE BIOPSY, RNFA to assist;  Surgeon: Jordis Laneta FALCON, MD;  Location: [facility];  Service: General;  Laterality: Bilateral;   ORIF ANKLE  FRACTURE Left 08/13/2024   Procedure: OPEN REDUCTION INTERNAL FIXATION (ORIF) ANKLE FRACTURE; Syndesmosis repair;  Surgeon: Jerri Kay HERO, MD;  Location: [facility];  Service: Orthopedics;  Laterality: Left;  OPEN REDUCTION INTERNAL FIXATION LEFT LATERAL MALLEOLUS FRACTURE, SYNDESMOSIS   PULSE GENERATOR IMPLANT N/A 01/24/2024   Procedure: UNILATERAL PULSE GENERATOR IMPLANT;  Surgeon: [name] ORN, MD;  Location: [facility];  Service: Neurosurgery;  Laterality: N/A;  INTERNAL PULSE GENERATOR PLACEMENT FOR SPINAL CORD STIMULATOR   REMOVAL OF TISSUE EXPANDER AND PLACEMENT OF IMPLANT Bilateral 06/06/2023   Procedure: REMOVAL OF TISSUE EXPANDER AND PLACEMENT OF IMPLANT;  Surgeon: [name], DO;  Location: [facility];  Service: Plastics;  Laterality: Bilateral;   SPINAL CORD STIMULATOR IMPLANT     THORACIC LAMINECTOMY FOR SPINAL CORD STIMULATOR N/A 01/15/2024   Procedure: THORACIC LAMINECTOMY FOR SPINAL CORD STIMULATOR;  Surgeon: [name] ORN, MD;  Location: [facility];  Service: Neurosurgery;  Laterality: N/A;  THORACIC LAMINECTOMY FOR SPINAL CORD STIMULATOR PADDLE TRIAL   TONSILLECTOMY     TOTAL HIP ARTHROPLASTY Left 01/26/2016   Procedure: LEFT TOTAL HIP ARTHROPLASTY ANTERIOR APPROACH;  Surgeon: Kay HERO Jerri, MD;  Location: [facility];  Service: Orthopedics;  Laterality: Left;   TRANSFORAMINAL LUMBAR INTERBODY FUSION (TLIF) WITH PEDICLE SCREW FIXATION 3 LEVEL  08/2019   Lynwood Better, MD L2-L5   Social History   Occupational History   Not on file  Tobacco Use   Smoking status: Never    Passive exposure: Never   Smokeless tobacco: Never  Vaping Use   Vaping status: Never Used  Substance and Sexual Activity   Alcohol use:  Yes    Comment: occasional wine   Drug use: No   Sexual activity: Not Currently    Birth control/protection: Surgical    Comment: Hyst     "

## 2024-09-24 ENCOUNTER — Ambulatory Visit

## 2024-09-25 ENCOUNTER — Encounter: Admitting: Physical Therapy

## 2024-09-25 ENCOUNTER — Ambulatory Visit: Attending: Physician Assistant

## 2024-09-25 DIAGNOSIS — R262 Difficulty in walking, not elsewhere classified: Secondary | ICD-10-CM | POA: Diagnosis present

## 2024-09-25 DIAGNOSIS — R278 Other lack of coordination: Secondary | ICD-10-CM | POA: Insufficient documentation

## 2024-09-25 DIAGNOSIS — M25572 Pain in left ankle and joints of left foot: Secondary | ICD-10-CM | POA: Diagnosis present

## 2024-09-25 DIAGNOSIS — R2681 Unsteadiness on feet: Secondary | ICD-10-CM | POA: Insufficient documentation

## 2024-09-25 DIAGNOSIS — M2560 Stiffness of unspecified joint, not elsewhere classified: Secondary | ICD-10-CM | POA: Diagnosis present

## 2024-09-25 DIAGNOSIS — R2689 Other abnormalities of gait and mobility: Secondary | ICD-10-CM | POA: Insufficient documentation

## 2024-09-25 DIAGNOSIS — S8262XA Displaced fracture of lateral malleolus of left fibula, initial encounter for closed fracture: Secondary | ICD-10-CM | POA: Insufficient documentation

## 2024-09-25 DIAGNOSIS — M6281 Muscle weakness (generalized): Secondary | ICD-10-CM | POA: Insufficient documentation

## 2024-09-25 NOTE — Therapy (Signed)
 " OUTPATIENT PHYSICAL THERAPY LOWER EXTREMITY EVALUATION   Patient Name: ISLAND DOHMEN MRN: [id number] DOB:1971/03/19, 54 y.o., female Today's Date: 09/26/2024  END OF SESSION:  PT End of Session - 09/26/24 1132     Visit Number 1    Number of Visits 24    Date for Recertification  12/18/24    Authorization Type [employer]    PT Start Time 1017    PT Stop Time 1059    PT Time Calculation (min) 42 min    Equipment Utilized During Treatment Gait belt    Activity Tolerance Patient tolerated treatment well;Treatment limited secondary to medical complications (Comment)   TDWB LLE   Behavior During Therapy WFL for tasks assessed/performed          Past Medical History:  Diagnosis Date   Anemia    Anesthesia complication    a.) PONV; b.) delayed emergence   Anxiety    Asthma    Benign cyst of kidney    Breast cancer (HCC) 01/18/2023   left breast-bil mastectomies   Cervical cancer (HCC)    Chronic back pain    Chronic hypotension    CKD (chronic kidney disease), stage II    Claustrophobia    Decompression injury of spinal cord    Depression    Dizziness    Family history of adverse reaction to anesthesia    a.) delayed emergence in 1st degree female relative (mother)   History of dysplastic nevus 01/18/2018   right shoulder, recurrent dysplastic nevus   History of kidney stones    History of shingles    Hypercholesteremia    Hypothyroidism    Inappropriate sinus node tachycardia    Migraine    Morbid obesity (HCC)    Multiple allergies    Neurogenic orthostatic hypotension (HCC)    Obesity    Osteoarthritis    left hip   Pneumonia    PONV (postoperative nausea and vomiting)    S/P insertion of spinal cord stimulator 01/24/2024   Seizure (HCC) 2021   x1-after a back surgery with spinal cord stimulator   Spinal cord stroke (HCC) 08/11/2020   spinal cord puncture during surgery   Wears glasses    Past Surgical History:  Procedure Laterality Date    ABDOMINAL HYSTERECTOMY     APPENDECTOMY     BACK SURGERY     BILIOPANCREATIC DIVISION W DUODENAL SWITCH N/A    BREAST BIOPSY Left 12/06/2022   u/s bx,1:00 heart clip, path pending   BREAST BIOPSY Left 12/06/2022   us  bx 2:00 ribbon clip path pending   BREAST BIOPSY Left 12/06/2022   US  LT BREAST BX W LOC DEV EA ADD LESION IMG BX SPEC US  GUIDE 12/06/2022 [facility]   BREAST BIOPSY Left 12/06/2022   US  LT BREAST BX W LOC DEV 1ST LESION IMG BX SPEC US  GUIDE 12/06/2022 [facility]   BREAST RECONSTRUCTION WITH PLACEMENT OF TISSUE EXPANDER AND FLEX HD (ACELLULAR HYDRATED DERMIS) Bilateral 01/18/2023   Procedure: IMMEDIATE LEFT BREAST RECONSTRUCTION WITH PLACEMENT OF TISSUE EXPANDER AND FLEX HD (ACELLULAR HYDRATED DERMIS);  Surgeon: [name], DO;  Location: [facility];  Service: Plastics;  Laterality: Bilateral;   BREAST RECONSTRUCTION WITH PLACEMENT OF TISSUE EXPANDER AND FLEX HD (ACELLULAR HYDRATED DERMIS) Left 03/26/2023   Procedure: BREAST RECONSTRUCTION WITH PLACEMENT OF TISSUE EXPANDER;  Surgeon: [name], DO;  Location: [facility];  Service: Plastics;  Laterality: Left;   CHOLECYSTECTOMY     COLONOSCOPY N/A 06/27/2021  Procedure: COLONOSCOPY;  Surgeon: Onita Elspeth Sharper, DO;  Location: [facility];  Service: Gastroenterology;  Laterality: N/A;   COLONOSCOPY N/A 08/27/2023   Procedure: COLONOSCOPY;  Surgeon: Onita Elspeth Sharper, DO;  Location: [facility] END[facility]COPY;  Service: Gastroenterology;  Laterality: N/A;   DIAGNOSTIC LAPAROSCOPY     LOA   ESOPHAGOGASTRODUODENOSCOPY N/A 06/27/2021   Procedure: ESOPHAGOGASTRODUODENOSCOPY (EGD);  Surgeon: Onita Elspeth Sharper, DO;  Location: [facility] ENDOSCOPY;  Service: Gastroenterology;  Laterality: N/A;   ESOPHAGOGASTRODUODENOSCOPY (EGD) WITH PROPOFOL  N/A 07/25/2017   Procedure: ESOPHAGOGASTRODUODENOSCOPY (EGD) WITH PROPOFOL ;  Surgeon: Viktoria Lamar DASEN, MD;  Location: [facility] ENDOSCOPY;  Service: Endoscopy;   Laterality: N/A;   HERNIA REPAIR     KNEE ARTHROSCOPY Right    LAPAROSCOPIC GASTRIC SLEEVE RESECTION WITH HIATAL HERNIA REPAIR     LIPOSUCTION WITH LIPOFILLING Bilateral 05/21/2024   Procedure: LIPOSUCTION, WITH FAT TRANSFER;  Surgeon: [name], DO;  Location: [facility];  Service: Plastics;  Laterality: Bilateral;  Bilateral breast fat grafting   LUMBAR FUSION  08/29/2022   Revision Lumbar two through five Laminectomy & Fusion with Transforaminal Lumbar interbody fusion   LUMBAR SPINAL CORD STIMULATOR REVISION Left 06/26/2024   Procedure: Left sided internal pulse generator site revision;  Surgeon: [name] ORN, MD;  Location: [facility];  Service: Neurosurgery;  Laterality: Left;   MASTECTOMY W/ SENTINEL NODE BIOPSY Bilateral 01/18/2023   Procedure: MASTECTOMY WITH SENTINEL LYMPH NODE BIOPSY, RNFA to assist;  Surgeon: Jordis Laneta FALCON, MD;  Location: [facility];  Service: General;  Laterality: Bilateral;   ORIF ANKLE FRACTURE Left 08/13/2024   Procedure: OPEN REDUCTION INTERNAL FIXATION (ORIF) ANKLE FRACTURE; Syndesmosis repair;  Surgeon: Jerri Kay HERO, MD;  Location: [facility];  Service: Orthopedics;  Laterality: Left;  OPEN REDUCTION INTERNAL FIXATION LEFT LATERAL MALLEOLUS FRACTURE, SYNDESMOSIS   PULSE GENERATOR IMPLANT N/A 01/24/2024   Procedure: UNILATERAL PULSE GENERATOR IMPLANT;  Surgeon: [name] ORN, MD;  Location: [facility];  Service: Neurosurgery;  Laterality: N/A;  INTERNAL PULSE GENERATOR PLACEMENT FOR SPINAL CORD STIMULATOR   REMOVAL OF TISSUE EXPANDER AND PLACEMENT OF IMPLANT Bilateral 06/06/2023   Procedure: REMOVAL OF TISSUE EXPANDER AND PLACEMENT OF IMPLANT;  Surgeon: [name], DO;  Location: [facility];  Service: Plastics;  Laterality: Bilateral;   SPINAL CORD STIMULATOR IMPLANT     THORACIC LAMINECTOMY FOR SPINAL CORD STIMULATOR N/A 01/15/2024   Procedure: THORACIC LAMINECTOMY FOR SPINAL CORD STIMULATOR;   Surgeon: [name] ORN, MD;  Location: [facility];  Service: Neurosurgery;  Laterality: N/A;  THORACIC LAMINECTOMY FOR SPINAL CORD STIMULATOR PADDLE TRIAL   TONSILLECTOMY     TOTAL HIP ARTHROPLASTY Left 01/26/2016   Procedure: LEFT TOTAL HIP ARTHROPLASTY ANTERIOR APPROACH;  Surgeon: Kay HERO Jerri, MD;  Location: [facility];  Service: Orthopedics;  Laterality: Left;   TRANSFORAMINAL LUMBAR INTERBODY FUSION (TLIF) WITH PEDICLE SCREW FIXATION 3 LEVEL  08/2019   Lynwood Better, MD L2-L5   Patient Active Problem List   Diagnosis Date Noted   Bimalleolar fracture of left ankle, closed, initial encounter 08/13/2024   Closed displaced fracture of lateral malleolus of left fibula 08/12/2024   Syndesmotic disruption of ankle, left, initial encounter 08/12/2024   Spinal cord stimulator dysfunction, sequela 06/12/2024   Breast asymmetry following reconstructive surgery 03/25/2024   S/P insertion of spinal cord stimulator 03/21/2024   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   Pain and swelling of lower leg 09/25/2023   Lymphedema 09/25/2023   Long term (current) use of aromatase inhibitors  09/07/2023   At risk for loss of bone density 09/07/2023   Changing skin lesion 09/04/2023   Frequent falls 08/22/2023   Chronic pain syndrome 08/22/2023   Bilateral lower extremity edema 08/06/2023   Back pain 08/06/2023   Claustrophobia    Pernicious anemia 03/14/2023   Ruptured left breast implant 02/13/2023   S/P mastectomy, bilateral 01/18/2023   Genetic testing 12/18/2022   Breast cancer (HCC) 12/08/2022   Family history of breast cancer 12/08/2022   Profound fatigue 12/08/2022   Lumbar post-laminectomy syndrome 06/02/2022   Pseudarthrosis after fusion or arthrodesis 05/19/2022   Primary osteoarthritis of right knee 03/28/2022   Subjective tinnitus of both ears 12/21/2021   Abnormality of gait 04/29/2021   Abnormal sensation in both ears 04/06/2021   Other adverse food reactions, not elsewhere classified,  subsequent encounter 04/06/2021   Multiple drug allergies 04/06/2021   Insomnia due to medical condition 02/25/2021   Myofascial muscle pain 02/25/2021   Nerve pain 10/18/2020   Incomplete paraplegia (HCC) 10/18/2020   Orthostatic hypotension 10/18/2020   Renal cyst 09/23/2020   Chronic migraine without aura without status migrainosus, not intractable    Hypokalemia    Adjustment reaction with anxiety and depression    Spinal cord injury, lumbar, without spinal bone injury, sequela 08/18/2020   Paraparesis (HCC)    Post-operative pain    Hypotension    Slow transit constipation    AKI (acute kidney injury)    Right leg weakness 08/11/2020   DDD (degenerative disc disease), lumbar 09/02/2019    Class: Chronic   Degenerative disc disease, lumbar 09/02/2019   Chest tightness 09/23/2018   Bradycardia 09/23/2018   BMI 40.0-44.9, adult (HCC) 04/17/2018   Status post bariatric surgery 04/17/2018   Labile blood pressure 03/08/2018   Chronic low back pain 09/03/2017   History of total hip replacement, left 01/26/2016   Osteoarthritis of spine with radiculopathy, lumbar region 10/16/2014   LEG PAIN, BILATERAL 12/16/2007   Malignant neoplasm of cervix uteri (HCC) 07/02/2007   MORBID OBESITY 07/02/2007   DEPRESSION 07/02/2007   Migraine without aura 07/02/2007   Other allergic rhinitis 07/02/2007   Asthma 07/02/2007   GERD 07/02/2007   ELEVATED BLOOD PRESSURE WITHOUT DIAGNOSIS OF HYPERTENSION 07/02/2007   Asthma 07/02/2007    PCP: Dr. Lynwood Null  REFERRING PROVIDER: Jule Shuck PA-C  REFERRING DIAG: [phone] (ICD-10-CM) - Closed displaced fracture of lateral malleolus of left fibula, initial encounter   THERAPY DIAG:  Muscle weakness (generalized)  Difficulty in walking, not elsewhere classified  Other lack of coordination  Unsteadiness on feet  Other abnormalities of gait and mobility  Pain in left ankle and joints of left foot  Limited joint range of motion  (ROM)  Rationale for Evaluation and Treatment: Rehabilitation  ONSET DATE: 08/04/2024  SUBJECTIVE:   SUBJECTIVE STATEMENT: Patient reports 2 falls on 08/04/2024- fracture left ankle and received surgery. Now 7 weeks post- op and mostly recently upgraded to TDWB  with walking boot until week 10 per order.   PERTINENT HISTORY: Per op note from 08/13/2024:  Ms. Weikel is a 54 y.o.-year-old female who sustained a left ankle fracture; she was indicated for open reduction and internal fixation due to the displaced nature of the articular fracture and came to the operating room today for this procedure. The patient did consent to the procedure after discussion of the risks and benefits.   PREOPERATIVE DIAGNOSIS: left bimalleolar ankle fracture with syndesmosis disruption   POSTOPERATIVE DIAGNOSIS: Same.   PROCEDURE:  Open treatment of left ankle fracture with internal fixation. Bimalleolar CPT [id number] Open treatment of left ankle syndesmosis with internal fixation. CPT [phone]   SURGEON: N. Ozell Cummins, M.D.   PAIN:  Are you having pain? Yes: NPRS scale: Current = 3/10; best= 0/10; worst = 8.5/10 Pain location: Left ankle from medial to ant to lateral Pain description: sharp, ache, electrical shock up to my toes Aggravating factors: dependent foot position causing swelling and throbbing or if I have been out to long on the scooter.  Relieving factors: Rest and elevation, tylenol   PRECAUTIONS: Fall and Other: Spinal stimulator  RED FLAGS: None   WEIGHT BEARING RESTRICTIONS:  S/p left ankle ORIF- TDWB until 10 weeks post-op  FALLS:  Has patient fallen in last 6 months? Yes. Number of falls 2  LIVING ENVIRONMENT: Lives with: lives with their family Lives in: House/apartment Stairs: Yes: External: 2 steps; can reach both Has following equipment at home: Single point cane, Walker - 2 wheeled, Walker - 4 wheeled, shower chair, bed side commode, Grab bars, and knee scooter  OCCUPATION:  Not currently working  PLOF: Independent with basic ADLs, Independent with household mobility with device, Independent with gait, Independent with transfers, Requires assistive device for independence, and Needs assistance with homemaking  PATIENT GOALS: I want to be able to walk without a limp  NEXT MD VISIT: 10/21/2024  OBJECTIVE:  Note: Objective measures were completed at Evaluation unless otherwise noted.  DIAGNOSTIC FINDINGS:  08/26/2024 Result Text  X-rays of the left ankle show stable alignment and fixation of a bimalleolar ankle fracture and syndesmosis    PATIENT SURVEYS:  LEFS  Extreme difficulty/unable (0), Quite a bit of difficulty (1), Moderate difficulty (2), Little difficulty (3), No difficulty (4) Survey date:  09/25/24  Any of your usual work, housework or school activities 0  2. Usual hobbies, recreational or sporting activities 0  3. Getting into/out of the bath 0  4. Walking between rooms 0  5. Putting on socks/shoes 0  6. Squatting  0  7. Lifting an object, like a bag of groceries from the floor 0  8. Performing light activities around your home 0  9. Performing heavy activities around your home 0  10. Getting into/out of a car 0  11. Walking 2 blocks 0  12. Walking 1 mile 0  13. Going up/down 10 stairs (1 flight) 0  14. Standing for 1 hour 0  15.  sitting for 1 hour 1  16. Running on even ground 0  17. Running on uneven ground 0  18. Making sharp turns while running fast 0  19. Hopping  0  20. Rolling over in bed 0  Score total:  1/80     COGNITION: Overall cognitive status: Within functional limits for tasks assessed     SENSATION: WFL  EDEMA:  Increased edema surround entire left ankle  SKIN INTEGRITY -Open area <5mm along superior incision on lateral ankle with min scabbing remaining and redness.    PALPATION: Tender to touch globally around ankle  LOWER EXTREMITY ROM:  Active ROM Right eval Left eval  Hip flexion    Hip  extension    Hip abduction    Hip adduction    Hip internal rotation    Hip external rotation    Knee flexion    Knee extension    Ankle dorsiflexion  Lacking 10 deg from neurtral  Ankle plantarflexion  18 deg  Ankle inversion  6   Ankle eversion  10   (Blank rows = not tested)  LOWER EXTREMITY MMT:  MMT Right eval Left eval  Hip flexion  4  Hip extension  4  Hip abduction  4  Hip adduction  4  Hip internal rotation  4  Hip external rotation  4  Knee flexion  3+  Knee extension  3+  Ankle dorsiflexion  2-  Ankle plantarflexion  2-  Ankle inversion  2-  Ankle eversion  2-   (Blank rows = not tested)  LOWER EXTREMITY SPECIAL TESTS:    FUNCTIONAL TESTS:    GAIT: Distance walked: 20 feet Assistive device utilized: Walker - 2 wheeled Level of assistance: CGA Comments: Patient instructed in walking maintaining TDWB LLE                                                                                                                                TREATMENT DATE: 09/25/2024   PT EVALUATION today  Self care/Home management: -See below For HEP instruction- perform 10-15 reps each today. -Instruction in safe walking using RW and maintaining prescribed WB precautions. -Instruction/Ed in exactly what TDWB LLE is defined as and reinterated not to exceed soley placing boot on floor and to continued to use weight bearing through UE on walker for safety.   PATIENT EDUCATION:  Education details: PT plan of care; Purpose of PT; HEP, WB precautions. Importance of Incision/wound daily inspection Person educated: Patient Education method: Explanation, Demonstration, Tactile cues, Verbal cues, and Handouts Education comprehension: verbalized understanding, returned demonstration, verbal cues required, tactile cues required, and needs further education  HOME EXERCISE PROGRAM: Access Code: [email or web address] URL: [email or web address] Date: 09/25/2024 Prepared by: Reyes London  Exercises - Supine Ankle Dorsiflexion and Plantarflexion AROM  - 1-2 x daily - 3 sets - 10 reps - Supine Ankle Inversion and Eversion AROM  - 2 x daily - 3 sets - 10 reps - Supine Ankle Circles  - 2 x daily - 3 sets - 10 reps - Ankle Inversion Eversion Towel Slide  - 1 x daily - 3 sets - 10 reps - Seated Chair Push Ups  - 3 x weekly - 3 sets - 10 reps  ASSESSMENT:  CLINICAL IMPRESSION: Ms. Munce is a 54 year old female presenting 7 weeks status post left bimalleolar ankle fracture with syndesmotic disruption treated with ORIF on 08/13/2024 following two falls. She is currently restricted to touchdown weight bearing of the left lower extremity in a walking boot until 10 weeks post operatively. Patient demonstrates significant impairments including left ankle pain, edema, globally limited ankle range of motion, and markedly decreased ankle strength, as well as reduced proximal lower extremity strength. She reports pain ranging from 0 to 8.5 out of 10, aggravated by dependent positioning and prolonged mobility, and partially relieved with rest, elevation, and medication. Examination reveals increased swelling throughout the left ankle, tenderness to palpation, and an incompletely healed lateral incision with a small open area  requiring monitoring. Functionally, the patient presents with severe activity limitations, as evidenced by an LEFS score of 1 out of 80, indicating profound difficulty with basic mobility, transfers, and household activities. Gait assessment reveals dependence on a two wheeled walker with contact guard assistance for short distances while maintaining prescribed weight bearing precautions. She remains at high fall risk given recent history of multiple falls, impaired lower extremity strength, restricted weight bearing, need for an assistive device, and functional limitations.  Overall clinical presentation is stable but evolving, with impairments consistent with expected  post operative status following ankle ORIF, compounded by prolonged non weight bearing and deconditioning. Based on current deficits, the patient is an appropriate candidate for skilled physical therapy to address pain, swelling, joint mobility, strength, gait training, and safety with progression of weight bearing per medical orders. Prognosis is good for improved functional mobility and return toward independent ambulation without gait deviation, contingent on adherence to weight bearing restrictions, incision healing, and continued participation in therapy and home exercise program.  OBJECTIVE IMPAIRMENTS: Abnormal gait, decreased activity tolerance, decreased balance, decreased coordination, decreased endurance, decreased mobility, difficulty walking, decreased ROM, decreased strength, hypomobility, and pain.   ACTIVITY LIMITATIONS: carrying, lifting, bending, sitting, standing, squatting, sleeping, stairs, transfers, bed mobility, bathing, toileting, and caring for others  PARTICIPATION LIMITATIONS: meal prep, cleaning, laundry, driving, shopping, community activity, occupation, and yard work  PERSONAL FACTORS: 3+ comorbidities: Cancer, Chronic LBP, OA are also affecting patient's functional outcome.   REHAB POTENTIAL: Good  CLINICAL DECISION MAKING: Evolving/moderate complexity  EVALUATION COMPLEXITY: Moderate   GOALS: Goals reviewed with patient? Yes  SHORT TERM GOALS: Target date: 11/05/2024 Pt will be independent with HEP in order to decrease ankle pain and increase strength in order to improve pain-free function at home and work.  Baseline: EVAL- No formal HEP in place Goal status: INITIAL   LONG TERM GOALS: Target date: 12/18/2024  Patient will ambulate >=600 feet on level surfaces with least restrictive assistive device, progressing to no assistive device as medically appropriate, demonstrating normalized gait mechanics without antalgic pattern or limp, to allow safe household  and community mobility. Baseline: EVAL- Patient currently using a knee scooter for all mobility Goal status: INITIAL  2.  Pt will increase LEFS by at least  40 points in order to demonstrate significant improvement in lower extremity function.  Baseline: EVAL = 1/80 Goal status: INITIAL  3.  Patient will report left ankle pain reduced to <=2/10 on NPRS at rest and <=3/10 with functional activities, with minimal swelling and no pain related to dependent positioning, allowing participation in household and community ambulation without activity limitation. Baseline: EVAL= up to 8/10 Left ankle pain Goal status: INITIAL  4.  Patient will increase left ankle strength to at least 4/5 in all planes and proximal lower extremity strength to at least 4+/5 to allow safe progression to full weight bearing and reduce fall risk. Baseline: EVAL= Left ankle = 2-/5 Goal status: INITIAL  5.  Patient will restore left ankle active range of motion to at least dorsiflexion 0-5 degrees, plantarflexion >=40 degrees, inversion >=20 degrees, and eversion >=10 degrees to support efficient gait mechanics and functional mobility. Baseline: EVAL- See ankle ROM chart Goal status: INITIAL   PLAN:  PT FREQUENCY: 1-2x/week  PT DURATION: 12 weeks  PLANNED INTERVENTIONS: [id number]- PT Re-evaluation, 97750- Physical Performance Testing, 97110-Therapeutic exercises, 97530- Therapeutic activity, [id number]- Neuromuscular re-education, 97535- Self Care, 02859- Manual therapy, [id number]- Gait training, [id number]- Orthotic Initial, [id number]- Orthotic/Prosthetic subsequent, [id number]- Canalith repositioning, [id number]-  Aquatic Therapy, [id number]- Electrical stimulation (manual), 02964- Ultrasound, [id number] (1-2 muscles), 20561 (3+ muscles)- Dry Needling, Patient/Family education, Balance training, Stair training, Taping, Joint mobilization, Joint manipulation, Manual lymph drainage, Scar mobilization, Compression bandaging, Vestibular training, DME instructions,  Cryotherapy, and Moist heat  PLAN FOR NEXT SESSION:  Manual therapy for ROM/pain. Gentle therex for left ankle  Review of gait with walker and maintain any WB precautions Observe left ankle wound   Reyes LOISE London, PT 09/26/2024, 11:36 AM  "

## 2024-09-29 ENCOUNTER — Other Ambulatory Visit: Admitting: Urology

## 2024-09-30 ENCOUNTER — Ambulatory Visit

## 2024-09-30 DIAGNOSIS — M6281 Muscle weakness (generalized): Secondary | ICD-10-CM | POA: Diagnosis not present

## 2024-09-30 DIAGNOSIS — R262 Difficulty in walking, not elsewhere classified: Secondary | ICD-10-CM

## 2024-09-30 DIAGNOSIS — R2689 Other abnormalities of gait and mobility: Secondary | ICD-10-CM

## 2024-09-30 DIAGNOSIS — R2681 Unsteadiness on feet: Secondary | ICD-10-CM

## 2024-09-30 NOTE — Therapy (Signed)
 " OUTPATIENT PHYSICAL THERAPY LOWER EXTREMITY TREATMENT   Patient Name: Vanessa Medina MRN: [id number] DOB:1971-05-22, 54 y.o., female Today's Date: 09/30/2024  END OF SESSION:  PT End of Session - 09/30/24 0850     Visit Number 2    Number of Visits 24    Date for Recertification  12/18/24    Authorization Type [employer]    PT Start Time 0845    PT Stop Time 0929    PT Time Calculation (min) 44 min    Equipment Utilized During Treatment Gait belt    Activity Tolerance Patient tolerated treatment well;Treatment limited secondary to medical complications (Comment)   TDWB LLE   Behavior During Therapy WFL for tasks assessed/performed           Past Medical History:  Diagnosis Date   Anemia    Anesthesia complication    a.) PONV; b.) delayed emergence   Anxiety    Asthma    Benign cyst of kidney    Breast cancer (HCC) 01/18/2023   left breast-bil mastectomies   Cervical cancer (HCC)    Chronic back pain    Chronic hypotension    CKD (chronic kidney disease), stage II    Claustrophobia    Decompression injury of spinal cord    Depression    Dizziness    Family history of adverse reaction to anesthesia    a.) delayed emergence in 1st degree female relative (mother)   History of dysplastic nevus 01/18/2018   right shoulder, recurrent dysplastic nevus   History of kidney stones    History of shingles    Hypercholesteremia    Hypothyroidism    Inappropriate sinus node tachycardia    Migraine    Morbid obesity (HCC)    Multiple allergies    Neurogenic orthostatic hypotension (HCC)    Obesity    Osteoarthritis    left hip   Pneumonia    PONV (postoperative nausea and vomiting)    S/P insertion of spinal cord stimulator 01/24/2024   Seizure (HCC) 2021   x1-after a back surgery with spinal cord stimulator   Spinal cord stroke (HCC) 08/11/2020   spinal cord puncture during surgery   Wears glasses    Past Surgical History:  Procedure Laterality Date    ABDOMINAL HYSTERECTOMY     APPENDECTOMY     BACK SURGERY     BILIOPANCREATIC DIVISION W DUODENAL SWITCH N/A    BREAST BIOPSY Left 12/06/2022   u/s bx,1:00 heart clip, path pending   BREAST BIOPSY Left 12/06/2022   us  bx 2:00 ribbon clip path pending   BREAST BIOPSY Left 12/06/2022   US  LT BREAST BX W LOC DEV EA ADD LESION IMG BX SPEC US  GUIDE 12/06/2022 [facility]   BREAST BIOPSY Left 12/06/2022   US  LT BREAST BX W LOC DEV 1ST LESION IMG BX SPEC US  GUIDE 12/06/2022 [facility]   BREAST RECONSTRUCTION WITH PLACEMENT OF TISSUE EXPANDER AND FLEX HD (ACELLULAR HYDRATED DERMIS) Bilateral 01/18/2023   Procedure: IMMEDIATE LEFT BREAST RECONSTRUCTION WITH PLACEMENT OF TISSUE EXPANDER AND FLEX HD (ACELLULAR HYDRATED DERMIS);  Surgeon: [name], DO;  Location: [facility];  Service: Plastics;  Laterality: Bilateral;   BREAST RECONSTRUCTION WITH PLACEMENT OF TISSUE EXPANDER AND FLEX HD (ACELLULAR HYDRATED DERMIS) Left 03/26/2023   Procedure: BREAST RECONSTRUCTION WITH PLACEMENT OF TISSUE EXPANDER;  Surgeon: [name], DO;  Location: [facility];  Service: Plastics;  Laterality: Left;   CHOLECYSTECTOMY     COLONOSCOPY N/A 06/27/2021  Procedure: COLONOSCOPY;  Surgeon: Onita Elspeth Sharper, DO;  Location: [facility]COPY;  Service: Gastroenterology;  Laterality: N/A;   COLONOSCOPY N/A 08/27/2023   Procedure: COLONOSCOPY;  Surgeon: Onita Elspeth Sharper, DO;  Location: [facility]OSCOPY;  Service: Gastroenterology;  Laterality: N/A;   DIAGNOSTIC LAPAROSCOPY     LOA   ESOPHAGOGASTRODUODENOSCOPY N/A 06/27/2021   Procedure: ESOPHAGOGASTRODUODENOSCOPY (EGD);  Surgeon: Onita Elspeth Sharper, DO;  Location: [facility] ENDOSCOPY;  Service: Gastroenterology;  Laterality: N/A;   ESOPHAGOGASTRODUODENOSCOPY (EGD) WITH PROPOFOL  N/A 07/25/2017   Procedure: ESOPHAGOGASTRODUODENOSCOPY (EGD) WITH PROPOFOL ;  Surgeon: Viktoria Lamar DASEN, MD;  Location: [facility] ENDOSCOPY;  Service: Endoscopy;   Laterality: N/A;   HERNIA REPAIR     KNEE ARTHROSCOPY Right    LAPAROSCOPIC GASTRIC SLEEVE RESECTION WITH HIATAL HERNIA REPAIR     LIPOSUCTION WITH LIPOFILLING Bilateral 05/21/2024   Procedure: LIPOSUCTION, WITH FAT TRANSFER;  Surgeon: [name], DO;  Location: [facility];  Service: Plastics;  Laterality: Bilateral;  Bilateral breast fat grafting   LUMBAR FUSION  08/29/2022   Revision Lumbar two through five Laminectomy & Fusion with Transforaminal Lumbar interbody fusion   LUMBAR SPINAL CORD STIMULATOR REVISION Left 06/26/2024   Procedure: Left sided internal pulse generator site revision;  Surgeon: [name] ORN, MD;  Location: [facility];  Service: Neurosurgery;  Laterality: Left;   MASTECTOMY W/ SENTINEL NODE BIOPSY Bilateral 01/18/2023   Procedure: MASTECTOMY WITH SENTINEL LYMPH NODE BIOPSY, RNFA to assist;  Surgeon: Jordis Laneta FALCON, MD;  Location: [facility];  Service: General;  Laterality: Bilateral;   ORIF ANKLE FRACTURE Left 08/13/2024   Procedure: OPEN REDUCTION INTERNAL FIXATION (ORIF) ANKLE FRACTURE; Syndesmosis repair;  Surgeon: Jerri Kay HERO, MD;  Location: [facility];  Service: Orthopedics;  Laterality: Left;  OPEN REDUCTION INTERNAL FIXATION LEFT LATERAL MALLEOLUS FRACTURE, SYNDESMOSIS   PULSE GENERATOR IMPLANT N/A 01/24/2024   Procedure: UNILATERAL PULSE GENERATOR IMPLANT;  Surgeon: [name] ORN, MD;  Location: [facility];  Service: Neurosurgery;  Laterality: N/A;  INTERNAL PULSE GENERATOR PLACEMENT FOR SPINAL CORD STIMULATOR   REMOVAL OF TISSUE EXPANDER AND PLACEMENT OF IMPLANT Bilateral 06/06/2023   Procedure: REMOVAL OF TISSUE EXPANDER AND PLACEMENT OF IMPLANT;  Surgeon: [name], DO;  Location: [facility];  Service: Plastics;  Laterality: Bilateral;   SPINAL CORD STIMULATOR IMPLANT     THORACIC LAMINECTOMY FOR SPINAL CORD STIMULATOR N/A 01/15/2024   Procedure: THORACIC LAMINECTOMY FOR SPINAL CORD STIMULATOR;   Surgeon: [name] ORN, MD;  Location: [facility];  Service: Neurosurgery;  Laterality: N/A;  THORACIC LAMINECTOMY FOR SPINAL CORD STIMULATOR PADDLE TRIAL   TONSILLECTOMY     TOTAL HIP ARTHROPLASTY Left 01/26/2016   Procedure: LEFT TOTAL HIP ARTHROPLASTY ANTERIOR APPROACH;  Surgeon: Kay HERO Jerri, MD;  Location: [facility];  Service: Orthopedics;  Laterality: Left;   TRANSFORAMINAL LUMBAR INTERBODY FUSION (TLIF) WITH PEDICLE SCREW FIXATION 3 LEVEL  08/2019   Lynwood Better, MD L2-L5   Patient Active Problem List   Diagnosis Date Noted   Bimalleolar fracture of left ankle, closed, initial encounter 08/13/2024   Closed displaced fracture of lateral malleolus of left fibula 08/12/2024   Syndesmotic disruption of ankle, left, initial encounter 08/12/2024   Spinal cord stimulator dysfunction, sequela 06/12/2024   Breast asymmetry following reconstructive surgery 03/25/2024   S/P insertion of spinal cord stimulator 03/21/2024   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   Pain and swelling of lower leg 09/25/2023   Lymphedema 09/25/2023   Long term (current) use of aromatase inhibitors  09/07/2023   At risk for loss of bone density 09/07/2023   Changing skin lesion 09/04/2023   Frequent falls 08/22/2023   Chronic pain syndrome 08/22/2023   Bilateral lower extremity edema 08/06/2023   Back pain 08/06/2023   Claustrophobia    Pernicious anemia 03/14/2023   Ruptured left breast implant 02/13/2023   S/P mastectomy, bilateral 01/18/2023   Genetic testing 12/18/2022   Breast cancer (HCC) 12/08/2022   Family history of breast cancer 12/08/2022   Profound fatigue 12/08/2022   Lumbar post-laminectomy syndrome 06/02/2022   Pseudarthrosis after fusion or arthrodesis 05/19/2022   Primary osteoarthritis of right knee 03/28/2022   Subjective tinnitus of both ears 12/21/2021   Abnormality of gait 04/29/2021   Abnormal sensation in both ears 04/06/2021   Other adverse food reactions, not elsewhere classified,  subsequent encounter 04/06/2021   Multiple drug allergies 04/06/2021   Insomnia due to medical condition 02/25/2021   Myofascial muscle pain 02/25/2021   Nerve pain 10/18/2020   Incomplete paraplegia (HCC) 10/18/2020   Orthostatic hypotension 10/18/2020   Renal cyst 09/23/2020   Chronic migraine without aura without status migrainosus, not intractable    Hypokalemia    Adjustment reaction with anxiety and depression    Spinal cord injury, lumbar, without spinal bone injury, sequela 08/18/2020   Paraparesis (HCC)    Post-operative pain    Hypotension    Slow transit constipation    AKI (acute kidney injury)    Right leg weakness 08/11/2020   DDD (degenerative disc disease), lumbar 09/02/2019    Class: Chronic   Degenerative disc disease, lumbar 09/02/2019   Chest tightness 09/23/2018   Bradycardia 09/23/2018   BMI 40.0-44.9, adult (HCC) 04/17/2018   Status post bariatric surgery 04/17/2018   Labile blood pressure 03/08/2018   Chronic low back pain 09/03/2017   History of total hip replacement, left 01/26/2016   Osteoarthritis of spine with radiculopathy, lumbar region 10/16/2014   LEG PAIN, BILATERAL 12/16/2007   Malignant neoplasm of cervix uteri (HCC) 07/02/2007   MORBID OBESITY 07/02/2007   DEPRESSION 07/02/2007   Migraine without aura 07/02/2007   Other allergic rhinitis 07/02/2007   Asthma 07/02/2007   GERD 07/02/2007   ELEVATED BLOOD PRESSURE WITHOUT DIAGNOSIS OF HYPERTENSION 07/02/2007   Asthma 07/02/2007    PCP: Dr. Lynwood Null  REFERRING PROVIDER: Jule Shuck PA-C  REFERRING DIAG: D17.37KJ (ICD-10-CM) - Closed displaced fracture of lateral malleolus of left fibula, initial encounter   THERAPY DIAG:  Muscle weakness (generalized)  Difficulty in walking, not elsewhere classified  Unsteadiness on feet  Other abnormalities of gait and mobility  Rationale for Evaluation and Treatment: Rehabilitation  ONSET DATE: 08/04/2024  SUBJECTIVE:   SUBJECTIVE  STATEMENT: Patient is 8 weeks post op, reports she is having a hard time getting up/down with weight bearing precautions. Having leg elevated helps the most.   PERTINENT HISTORY: Per op note from 08/13/2024:  Ms. Roylance is a 54 y.o.-year-old female who sustained a left ankle fracture; she was indicated for open reduction and internal fixation due to the displaced nature of the articular fracture and came to the operating room today for this procedure. The patient did consent to the procedure after discussion of the risks and benefits.   PREOPERATIVE DIAGNOSIS: left bimalleolar ankle fracture with syndesmosis disruption   POSTOPERATIVE DIAGNOSIS: Same.   PROCEDURE:  Open treatment of left ankle fracture with internal fixation. Bimalleolar CPT [id number] Open treatment of left ankle syndesmosis with internal fixation. CPT [phone]   SURGEON: N. Ozell Cummins,  M.D.   PAIN:  Are you having pain? Yes: NPRS scale: Current = 3/10; best= 0/10; worst = 8.5/10 Pain location: Left ankle from medial to ant to lateral Pain description: sharp, ache, electrical shock up to my toes Aggravating factors: dependent foot position causing swelling and throbbing or if I have been out to long on the scooter.  Relieving factors: Rest and elevation, tylenol   PRECAUTIONS: Fall and Other: Spinal stimulator  RED FLAGS: None   WEIGHT BEARING RESTRICTIONS:  S/p left ankle ORIF- TDWB until 10 weeks post-op  FALLS:  Has patient fallen in last 6 months? Yes. Number of falls 2  LIVING ENVIRONMENT: Lives with: lives with their family Lives in: House/apartment Stairs: Yes: External: 2 steps; can reach both Has following equipment at home: Single point cane, Walker - 2 wheeled, Walker - 4 wheeled, shower chair, bed side commode, Grab bars, and knee scooter  OCCUPATION: Not currently working  PLOF: Independent with basic ADLs, Independent with household mobility with device, Independent with gait, Independent with  transfers, Requires assistive device for independence, and Needs assistance with homemaking  PATIENT GOALS: I want to be able to walk without a limp  NEXT MD VISIT: 10/21/2024  OBJECTIVE:  Note: Objective measures were completed at Evaluation unless otherwise noted.  DIAGNOSTIC FINDINGS:  08/26/2024 Result Text  X-rays of the left ankle show stable alignment and fixation of a bimalleolar ankle fracture and syndesmosis    PATIENT SURVEYS:  LEFS  Extreme difficulty/unable (0), Quite a bit of difficulty (1), Moderate difficulty (2), Little difficulty (3), No difficulty (4) Survey date:  09/25/24  Any of your usual work, housework or school activities 0  2. Usual hobbies, recreational or sporting activities 0  3. Getting into/out of the bath 0  4. Walking between rooms 0  5. Putting on socks/shoes 0  6. Squatting  0  7. Lifting an object, like a bag of groceries from the floor 0  8. Performing light activities around your home 0  9. Performing heavy activities around your home 0  10. Getting into/out of a car 0  11. Walking 2 blocks 0  12. Walking 1 mile 0  13. Going up/down 10 stairs (1 flight) 0  14. Standing for 1 hour 0  15.  sitting for 1 hour 1  16. Running on even ground 0  17. Running on uneven ground 0  18. Making sharp turns while running fast 0  19. Hopping  0  20. Rolling over in bed 0  Score total:  1/80     COGNITION: Overall cognitive status: Within functional limits for tasks assessed     SENSATION: WFL  EDEMA:  Increased edema surround entire left ankle  SKIN INTEGRITY -Open area <55mm along superior incision on lateral ankle with min scabbing remaining and redness.    PALPATION: Tender to touch globally around ankle  LOWER EXTREMITY ROM:  Active ROM Right eval Left eval  Hip flexion    Hip extension    Hip abduction    Hip adduction    Hip internal rotation    Hip external rotation    Knee flexion    Knee extension    Ankle  dorsiflexion  Lacking 10 deg from neurtral  Ankle plantarflexion  18 deg  Ankle inversion  6   Ankle eversion  10   (Blank rows = not tested)  LOWER EXTREMITY MMT:  MMT Right eval Left eval  Hip flexion  4  Hip extension  4  Hip abduction  4  Hip adduction  4  Hip internal rotation  4  Hip external rotation  4  Knee flexion  3+  Knee extension  3+  Ankle dorsiflexion  2-  Ankle plantarflexion  2-  Ankle inversion  2-  Ankle eversion  2-   (Blank rows = not tested)  LOWER EXTREMITY SPECIAL TESTS:    FUNCTIONAL TESTS:    GAIT: Distance walked: 20 feet Assistive device utilized: Walker - 2 wheeled Level of assistance: CGA Comments: Patient instructed in walking maintaining TDWB LLE                                                                                                                                TREATMENT DATE: 09/30/24 Wound assessment: tiny wound at top of incision 0.5 inch diameter   TE- To improve strength, endurance, mobility, and function of specific targeted muscle groups or improve joint range of motion or improve muscle flexibility  Patient in long sit position: -df/pf with overpressure hold x multiple reps -isometric df/pf 10x 5 second holds 2 sets -toe abduction 20x -active flexion 10x; hold 5 seconds -active extension 10x; hold 5 seconds -gentle eversion/inversion within pain free range 10x  Manual: Scar tissue massage x8 minutes; avoiding open wound area   PATIENT EDUCATION:  Education details: PT plan of care; Purpose of PT; HEP, WB precautions. Importance of Incision/wound daily inspection Person educated: Patient Education method: Explanation, Demonstration, Tactile cues, Verbal cues, and Handouts Education comprehension: verbalized understanding, returned demonstration, verbal cues required, tactile cues required, and needs further education  HOME EXERCISE PROGRAM: Access Code: [email or web address] URL:  [email or web address] Date: 09/25/2024 Prepared by: Reyes London  Exercises - Supine Ankle Dorsiflexion and Plantarflexion AROM  - 1-2 x daily - 3 sets - 10 reps - Supine Ankle Inversion and Eversion AROM  - 2 x daily - 3 sets - 10 reps - Supine Ankle Circles  - 2 x daily - 3 sets - 10 reps - Ankle Inversion Eversion Towel Slide  - 1 x daily - 3 sets - 10 reps - Seated Chair Push Ups  - 3 x weekly - 3 sets - 10 reps  ASSESSMENT:  CLINICAL IMPRESSION: The patient is an eight week post operative female currently performing toe touch weightbearing and presents with a small superficial opening at the superior aspect of the incision. She demonstrates ongoing limitations in ankle mobility, strength, and motor control as expected at this stage of recovery. During todays session she tolerated therapeutic exercise in long sitting that targeted dorsiflexion and plantarflexion activation, intrinsic foot control, and controlled ankle mobility. She also tolerated gentle scar tissue massage while avoiding the open wound area. Her wound remains small and stable but requires continued monitoring. Continued physical therapy is beneficial to safely progress weightbearing, restore ankle range of motion, improve lower extremity strength, and reduce compensatory movement patterns. Ongoing guided rehabilitation will also support proper scar mobility, reduce stiffness, and  help the patient transition toward more functional activities while protecting healing tissues.   OBJECTIVE IMPAIRMENTS: Abnormal gait, decreased activity tolerance, decreased balance, decreased coordination, decreased endurance, decreased mobility, difficulty walking, decreased ROM, decreased strength, hypomobility, and pain.   ACTIVITY LIMITATIONS: carrying, lifting, bending, sitting, standing, squatting, sleeping, stairs, transfers, bed mobility, bathing, toileting, and caring for others  PARTICIPATION LIMITATIONS: meal  prep, cleaning, laundry, driving, shopping, community activity, occupation, and yard work  PERSONAL FACTORS: 3+ comorbidities: Cancer, Chronic LBP, OA are also affecting patient's functional outcome.   REHAB POTENTIAL: Good  CLINICAL DECISION MAKING: Evolving/moderate complexity  EVALUATION COMPLEXITY: Moderate   GOALS: Goals reviewed with patient? Yes  SHORT TERM GOALS: Target date: 11/05/2024 Pt will be independent with HEP in order to decrease ankle pain and increase strength in order to improve pain-free function at home and work.  Baseline: EVAL- No formal HEP in place Goal status: INITIAL   LONG TERM GOALS: Target date: 12/18/2024  Patient will ambulate >=600 feet on level surfaces with least restrictive assistive device, progressing to no assistive device as medically appropriate, demonstrating normalized gait mechanics without antalgic pattern or limp, to allow safe household and community mobility. Baseline: EVAL- Patient currently using a knee scooter for all mobility Goal status: INITIAL  2.  Pt will increase LEFS by at least  40 points in order to demonstrate significant improvement in lower extremity function.  Baseline: EVAL = 1/80 Goal status: INITIAL  3.  Patient will report left ankle pain reduced to <=2/10 on NPRS at rest and <=3/10 with functional activities, with minimal swelling and no pain related to dependent positioning, allowing participation in household and community ambulation without activity limitation. Baseline: EVAL= up to 8/10 Left ankle pain Goal status: INITIAL  4.  Patient will increase left ankle strength to at least 4/5 in all planes and proximal lower extremity strength to at least 4+/5 to allow safe progression to full weight bearing and reduce fall risk. Baseline: EVAL= Left ankle = 2-/5 Goal status: INITIAL  5.  Patient will restore left ankle active range of motion to at least dorsiflexion 0-5 degrees, plantarflexion >=40 degrees,  inversion >=20 degrees, and eversion >=10 degrees to support efficient gait mechanics and functional mobility. Baseline: EVAL- See ankle ROM chart Goal status: INITIAL   PLAN:  PT FREQUENCY: 1-2x/week  PT DURATION: 12 weeks  PLANNED INTERVENTIONS: [id number]- PT Re-evaluation, 97750- Physical Performance Testing, 97110-Therapeutic exercises, 97530- Therapeutic activity, [id number]- Neuromuscular re-education, 97535- Self Care, [id number]- Manual therapy, [id number]- Gait training, [phone]- Orthotic Initial, [phone]- Orthotic/Prosthetic subsequent, [phone]- Canalith repositioning, [id number]- Aquatic Therapy, [phone]- Electrical stimulation (manual), [id number]- Ultrasound, [id number] (1-2 muscles), [phone] (3+ muscles)- Dry Needling, Patient/Family education, Balance training, Stair training, Taping, Joint mobilization, Joint manipulation, Manual lymph drainage, Scar mobilization, Compression bandaging, Vestibular training, DME instructions, Cryotherapy, and Moist heat  PLAN FOR NEXT SESSION:  Manual therapy for ROM/pain. Gentle therex for left ankle  Review of gait with walker and maintain any WB precautions Observe left ankle wound   Zamya Culhane, PT 09/30/2024, 9:29 AM  "

## 2024-10-01 ENCOUNTER — Encounter: Admitting: Physical Medicine and Rehabilitation

## 2024-10-02 ENCOUNTER — Encounter: Admitting: Physical Therapy

## 2024-10-02 ENCOUNTER — Ambulatory Visit

## 2024-10-08 ENCOUNTER — Ambulatory Visit: Admitting: Physical Therapy

## 2024-10-08 DIAGNOSIS — R262 Difficulty in walking, not elsewhere classified: Secondary | ICD-10-CM

## 2024-10-08 DIAGNOSIS — R2681 Unsteadiness on feet: Secondary | ICD-10-CM

## 2024-10-08 DIAGNOSIS — M2560 Stiffness of unspecified joint, not elsewhere classified: Secondary | ICD-10-CM

## 2024-10-08 DIAGNOSIS — R278 Other lack of coordination: Secondary | ICD-10-CM

## 2024-10-08 DIAGNOSIS — R2689 Other abnormalities of gait and mobility: Secondary | ICD-10-CM

## 2024-10-08 DIAGNOSIS — M6281 Muscle weakness (generalized): Secondary | ICD-10-CM

## 2024-10-08 DIAGNOSIS — M25572 Pain in left ankle and joints of left foot: Secondary | ICD-10-CM

## 2024-10-08 NOTE — Therapy (Signed)
 " OUTPATIENT PHYSICAL THERAPY LOWER EXTREMITY TREATMENT   Patient Name: Vanessa Medina MRN: [id number] DOB:March 29, 1971, 54 y.o., female Today's Date: 10/08/2024  END OF SESSION:  PT End of Session - 10/08/24 1400     Visit Number 3    Number of Visits 24    Date for Recertification  12/18/24    Authorization Type [employer]    PT Start Time 1402    PT Stop Time 1445    PT Time Calculation (min) 43 min    Equipment Utilized During Treatment Gait belt    Activity Tolerance Patient tolerated treatment well;Treatment limited secondary to medical complications (Comment)   TDWB LLE   Behavior During Therapy WFL for tasks assessed/performed           Past Medical History:  Diagnosis Date   Anemia    Anesthesia complication    a.) PONV; b.) delayed emergence   Anxiety    Asthma    Benign cyst of kidney    Breast cancer (HCC) 01/18/2023   left breast-bil mastectomies   Cervical cancer (HCC)    Chronic back pain    Chronic hypotension    CKD (chronic kidney disease), stage II    Claustrophobia    Decompression injury of spinal cord    Depression    Dizziness    Family history of adverse reaction to anesthesia    a.) delayed emergence in 1st degree female relative (mother)   History of dysplastic nevus 01/18/2018   right shoulder, recurrent dysplastic nevus   History of kidney stones    History of shingles    Hypercholesteremia    Hypothyroidism    Inappropriate sinus node tachycardia    Migraine    Morbid obesity (HCC)    Multiple allergies    Neurogenic orthostatic hypotension (HCC)    Obesity    Osteoarthritis    left hip   Pneumonia    PONV (postoperative nausea and vomiting)    S/P insertion of spinal cord stimulator 01/24/2024   Seizure (HCC) 2021   x1-after a back surgery with spinal cord stimulator   Spinal cord stroke (HCC) 08/11/2020   spinal cord puncture during surgery   Wears glasses    Past Surgical History:  Procedure Laterality Date    ABDOMINAL HYSTERECTOMY     APPENDECTOMY     BACK SURGERY     BILIOPANCREATIC DIVISION W DUODENAL SWITCH N/A    BREAST BIOPSY Left 12/06/2022   u/s bx,1:00 heart clip, path pending   BREAST BIOPSY Left 12/06/2022   us  bx 2:00 ribbon clip path pending   BREAST BIOPSY Left 12/06/2022   US  LT BREAST BX W LOC DEV EA ADD LESION IMG BX SPEC US  GUIDE 12/06/2022 [facility]   BREAST BIOPSY Left 12/06/2022   US  LT BREAST BX W LOC DEV 1ST LESION IMG BX SPEC US  GUIDE 12/06/2022 [facility]   BREAST RECONSTRUCTION WITH PLACEMENT OF TISSUE EXPANDER AND FLEX HD (ACELLULAR HYDRATED DERMIS) Bilateral 01/18/2023   Procedure: IMMEDIATE LEFT BREAST RECONSTRUCTION WITH PLACEMENT OF TISSUE EXPANDER AND FLEX HD (ACELLULAR HYDRATED DERMIS);  Surgeon: [name], DO;  Location: [facility];  Service: Plastics;  Laterality: Bilateral;   BREAST RECONSTRUCTION WITH PLACEMENT OF TISSUE EXPANDER AND FLEX HD (ACELLULAR HYDRATED DERMIS) Left 03/26/2023   Procedure: BREAST RECONSTRUCTION WITH PLACEMENT OF TISSUE EXPANDER;  Surgeon: [name], DO;  Location: [facility];  Service: Plastics;  Laterality: Left;   CHOLECYSTECTOMY     COLONOSCOPY N/A 06/27/2021  Procedure: COLONOSCOPY;  Surgeon: Onita Elspeth Sharper, DO;  Location: [facility]OSCOPY;  Service: Gastroenterology;  Laterality: N/A;   COLONOSCOPY N/A 08/27/2023   Procedure: COLONOSCOPY;  Surgeon: Onita Elspeth Sharper, DO;  Location: [facility] ENDOSCOPY;  Service: Gastroenterology;  Laterality: N/A;   DIAGNOSTIC LAPAROSCOPY     LOA   ESOPHAGOGASTRODUODENOSCOPY N/A 06/27/2021   Procedure: ESOPHAGOGASTRODUODENOSCOPY (EGD);  Surgeon: Onita Elspeth Sharper, DO;  Location: [facility] ENDOSCOPY;  Service: Gastroenterology;  Laterality: N/A;   ESOPHAGOGASTRODUODENOSCOPY (EGD) WITH PROPOFOL  N/A 07/25/2017   Procedure: ESOPHAGOGASTRODUODENOSCOPY (EGD) WITH PROPOFOL ;  Surgeon: Viktoria Lamar DASEN, MD;  Location: [facility] ENDOSCOPY;  Service: Endoscopy;   Laterality: N/A;   HERNIA REPAIR     KNEE ARTHROSCOPY Right    LAPAROSCOPIC GASTRIC SLEEVE RESECTION WITH HIATAL HERNIA REPAIR     LIPOSUCTION WITH LIPOFILLING Bilateral 05/21/2024   Procedure: LIPOSUCTION, WITH FAT TRANSFER;  Surgeon: [name], DO;  Location: [facility];  Service: Plastics;  Laterality: Bilateral;  Bilateral breast fat grafting   LUMBAR FUSION  08/29/2022   Revision Lumbar two through five Laminectomy & Fusion with Transforaminal Lumbar interbody fusion   LUMBAR SPINAL CORD STIMULATOR REVISION Left 06/26/2024   Procedure: Left sided internal pulse generator site revision;  Surgeon: [name] ORN, MD;  Location: [facility];  Service: Neurosurgery;  Laterality: Left;   MASTECTOMY W/ SENTINEL NODE BIOPSY Bilateral 01/18/2023   Procedure: MASTECTOMY WITH SENTINEL LYMPH NODE BIOPSY, RNFA to assist;  Surgeon: Jordis Laneta FALCON, MD;  Location: [facility];  Service: General;  Laterality: Bilateral;   ORIF ANKLE FRACTURE Left 08/13/2024   Procedure: OPEN REDUCTION INTERNAL FIXATION (ORIF) ANKLE FRACTURE; Syndesmosis repair;  Surgeon: Jerri Kay HERO, MD;  Location: [facility];  Service: Orthopedics;  Laterality: Left;  OPEN REDUCTION INTERNAL FIXATION LEFT LATERAL MALLEOLUS FRACTURE, SYNDESMOSIS   PULSE GENERATOR IMPLANT N/A 01/24/2024   Procedure: UNILATERAL PULSE GENERATOR IMPLANT;  Surgeon: [name] ORN, MD;  Location: [facility];  Service: Neurosurgery;  Laterality: N/A;  INTERNAL PULSE GENERATOR PLACEMENT FOR SPINAL CORD STIMULATOR   REMOVAL OF TISSUE EXPANDER AND PLACEMENT OF IMPLANT Bilateral 06/06/2023   Procedure: REMOVAL OF TISSUE EXPANDER AND PLACEMENT OF IMPLANT;  Surgeon: [name], DO;  Location: [facility];  Service: Plastics;  Laterality: Bilateral;   SPINAL CORD STIMULATOR IMPLANT     THORACIC LAMINECTOMY FOR SPINAL CORD STIMULATOR N/A 01/15/2024   Procedure: THORACIC LAMINECTOMY FOR SPINAL CORD STIMULATOR;   Surgeon: [name] ORN, MD;  Location: [facility];  Service: Neurosurgery;  Laterality: N/A;  THORACIC LAMINECTOMY FOR SPINAL CORD STIMULATOR PADDLE TRIAL   TONSILLECTOMY     TOTAL HIP ARTHROPLASTY Left 01/26/2016   Procedure: LEFT TOTAL HIP ARTHROPLASTY ANTERIOR APPROACH;  Surgeon: Kay HERO Jerri, MD;  Location: [facility];  Service: Orthopedics;  Laterality: Left;   TRANSFORAMINAL LUMBAR INTERBODY FUSION (TLIF) WITH PEDICLE SCREW FIXATION 3 LEVEL  08/2019   Lynwood Better, MD L2-L5   Patient Active Problem List   Diagnosis Date Noted   Bimalleolar fracture of left ankle, closed, initial encounter 08/13/2024   Closed displaced fracture of lateral malleolus of left fibula 08/12/2024   Syndesmotic disruption of ankle, left, initial encounter 08/12/2024   Spinal cord stimulator dysfunction, sequela 06/12/2024   Breast asymmetry following reconstructive surgery 03/25/2024   S/P insertion of spinal cord stimulator 03/21/2024   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   Pain and swelling of lower leg 09/25/2023   Lymphedema 09/25/2023   Long term (current) use of aromatase inhibitors  09/07/2023   At risk for loss of bone density 09/07/2023   Changing skin lesion 09/04/2023   Frequent falls 08/22/2023   Chronic pain syndrome 08/22/2023   Bilateral lower extremity edema 08/06/2023   Back pain 08/06/2023   Claustrophobia    Pernicious anemia 03/14/2023   Ruptured left breast implant 02/13/2023   S/P mastectomy, bilateral 01/18/2023   Genetic testing 12/18/2022   Breast cancer (HCC) 12/08/2022   Family history of breast cancer 12/08/2022   Profound fatigue 12/08/2022   Lumbar post-laminectomy syndrome 06/02/2022   Pseudarthrosis after fusion or arthrodesis 05/19/2022   Primary osteoarthritis of right knee 03/28/2022   Subjective tinnitus of both ears 12/21/2021   Abnormality of gait 04/29/2021   Abnormal sensation in both ears 04/06/2021   Other adverse food reactions, not elsewhere classified,  subsequent encounter 04/06/2021   Multiple drug allergies 04/06/2021   Insomnia due to medical condition 02/25/2021   Myofascial muscle pain 02/25/2021   Nerve pain 10/18/2020   Incomplete paraplegia (HCC) 10/18/2020   Orthostatic hypotension 10/18/2020   Renal cyst 09/23/2020   Chronic migraine without aura without status migrainosus, not intractable    Hypokalemia    Adjustment reaction with anxiety and depression    Spinal cord injury, lumbar, without spinal bone injury, sequela 08/18/2020   Paraparesis (HCC)    Post-operative pain    Hypotension    Slow transit constipation    AKI (acute kidney injury)    Right leg weakness 08/11/2020   DDD (degenerative disc disease), lumbar 09/02/2019    Class: Chronic   Degenerative disc disease, lumbar 09/02/2019   Chest tightness 09/23/2018   Bradycardia 09/23/2018   BMI 40.0-44.9, adult (HCC) 04/17/2018   Status post bariatric surgery 04/17/2018   Labile blood pressure 03/08/2018   Chronic low back pain 09/03/2017   History of total hip replacement, left 01/26/2016   Osteoarthritis of spine with radiculopathy, lumbar region 10/16/2014   LEG PAIN, BILATERAL 12/16/2007   Malignant neoplasm of cervix uteri (HCC) 07/02/2007   MORBID OBESITY 07/02/2007   DEPRESSION 07/02/2007   Migraine without aura 07/02/2007   Other allergic rhinitis 07/02/2007   Asthma 07/02/2007   GERD 07/02/2007   ELEVATED BLOOD PRESSURE WITHOUT DIAGNOSIS OF HYPERTENSION 07/02/2007   Asthma 07/02/2007    PCP: Dr. Lynwood Null  REFERRING PROVIDER: Jule Shuck PA-C  REFERRING DIAG: [phone] (ICD-10-CM) - Closed displaced fracture of lateral malleolus of left fibula, initial encounter   THERAPY DIAG:  Muscle weakness (generalized)  Difficulty in walking, not elsewhere classified  Unsteadiness on feet  Other abnormalities of gait and mobility  Other lack of coordination  Pain in left ankle and joints of left foot  Limited joint range of motion  (ROM)  Rationale for Evaluation and Treatment: Rehabilitation  ONSET DATE: 08/04/2024  SUBJECTIVE:   SUBJECTIVE STATEMENT: Patient is still 8 weeks post op, reports she is having a hard time getting up/down with weight bearing precautions. Reports that he father has been in the hospital for a week, so she had to be on her LLE a little more than she should be.   PERTINENT HISTORY: Per op note from 08/13/2024:  Ms. Hulett is a 54 y.o.-year-old female who sustained a left ankle fracture; she was indicated for open reduction and internal fixation due to the displaced nature of the articular fracture and came to the operating room today for this procedure. The patient did consent to the procedure after discussion of the risks and benefits.   PREOPERATIVE DIAGNOSIS: left  bimalleolar ankle fracture with syndesmosis disruption   POSTOPERATIVE DIAGNOSIS: Same.   PROCEDURE:  Open treatment of left ankle fracture with internal fixation. Bimalleolar CPT [id number] Open treatment of left ankle syndesmosis with internal fixation. CPT [phone]   SURGEON: N. Ozell Cummins, M.D.   PAIN:  Are you having pain? Yes: NPRS scale: Current = 3/10; best= 0/10; worst = 8.5/10 Pain location: Left ankle from medial to ant to lateral Pain description: sharp, ache, electrical shock up to my toes Aggravating factors: dependent foot position causing swelling and throbbing or if I have been out to long on the scooter.  Relieving factors: Rest and elevation, tylenol   PRECAUTIONS: Fall and Other: Spinal stimulator  RED FLAGS: None   WEIGHT BEARING RESTRICTIONS:  S/p left ankle ORIF- TDWB until 10 weeks post-op  FALLS:  Has patient fallen in last 6 months? Yes. Number of falls 2  LIVING ENVIRONMENT: Lives with: lives with their family Lives in: House/apartment Stairs: Yes: External: 2 steps; can reach both Has following equipment at home: Single point cane, Walker - 2 wheeled, Walker - 4 wheeled, shower chair, bed  side commode, Grab bars, and knee scooter  OCCUPATION: Not currently working  PLOF: Independent with basic ADLs, Independent with household mobility with device, Independent with gait, Independent with transfers, Requires assistive device for independence, and Needs assistance with homemaking  PATIENT GOALS: I want to be able to walk without a limp  NEXT MD VISIT: 10/21/2024  OBJECTIVE:  Note: Objective measures were completed at Evaluation unless otherwise noted.  DIAGNOSTIC FINDINGS:  08/26/2024 Result Text  X-rays of the left ankle show stable alignment and fixation of a bimalleolar ankle fracture and syndesmosis    PATIENT SURVEYS:  LEFS  Extreme difficulty/unable (0), Quite a bit of difficulty (1), Moderate difficulty (2), Little difficulty (3), No difficulty (4) Survey date:  09/25/24  Any of your usual work, housework or school activities 0  2. Usual hobbies, recreational or sporting activities 0  3. Getting into/out of the bath 0  4. Walking between rooms 0  5. Putting on socks/shoes 0  6. Squatting  0  7. Lifting an object, like a bag of groceries from the floor 0  8. Performing light activities around your home 0  9. Performing heavy activities around your home 0  10. Getting into/out of a car 0  11. Walking 2 blocks 0  12. Walking 1 mile 0  13. Going up/down 10 stairs (1 flight) 0  14. Standing for 1 hour 0  15.  sitting for 1 hour 1  16. Running on even ground 0  17. Running on uneven ground 0  18. Making sharp turns while running fast 0  19. Hopping  0  20. Rolling over in bed 0  Score total:  1/80     COGNITION: Overall cognitive status: Within functional limits for tasks assessed     SENSATION: WFL  EDEMA:  Increased edema surround entire left ankle  SKIN INTEGRITY -Open area <66mm along superior incision on lateral ankle with min scabbing remaining and redness.    PALPATION: Tender to touch globally around ankle  LOWER EXTREMITY  ROM:  Active ROM Right eval Left eval  Hip flexion    Hip extension    Hip abduction    Hip adduction    Hip internal rotation    Hip external rotation    Knee flexion    Knee extension    Ankle dorsiflexion  Lacking 10 deg from neurtral  Ankle plantarflexion  18 deg  Ankle inversion  6   Ankle eversion  10   (Blank rows = not tested)  LOWER EXTREMITY MMT:  MMT Right eval Left eval  Hip flexion  4  Hip extension  4  Hip abduction  4  Hip adduction  4  Hip internal rotation  4  Hip external rotation  4  Knee flexion  3+  Knee extension  3+  Ankle dorsiflexion  2-  Ankle plantarflexion  2-  Ankle inversion  2-  Ankle eversion  2-   (Blank rows = not tested)  LOWER EXTREMITY SPECIAL TESTS:    FUNCTIONAL TESTS:    GAIT: Distance walked: 20 feet Assistive device utilized: Walker - 2 wheeled Level of assistance: CGA Comments: Patient instructed in walking maintaining TDWB LLE                                                                                                                                TREATMENT DATE: 10/08/24   Wound assessment: tiny wound at top of incision still present  ~0.5 inch diameter   TE- To improve strength, endurance, mobility, and function of specific targeted muscle groups or improve joint range of motion or improve muscle flexibility  Patient in long sit position: -df/pf with overpressure hold x multiple reps -isometric df/pf 10x 5 second holds 2 sets -toe abduction 20x -active flexion 10x; hold 5 seconds x2 bouts -active extension 10x; hold 5 seconds x 2 bouts  -gentle eversion/inversion within pain free range 10 x 2 bouts   Manual: Scar tissue massage x10 minutes; and edema management techniques avoiding open wound area   PATIENT EDUCATION:  Education details: PT plan of care; Purpose of PT; HEP, WB precautions. Importance of Incision/wound daily inspection Person educated: Patient Education method: Explanation,  Demonstration, Tactile cues, Verbal cues, and Handouts Education comprehension: verbalized understanding, returned demonstration, verbal cues required, tactile cues required, and needs further education  HOME EXERCISE PROGRAM: Access Code: [email or web address] URL: [email or web address] Date: 09/25/2024 Prepared by: Reyes London  Exercises - Supine Ankle Dorsiflexion and Plantarflexion AROM  - 1-2 x daily - 3 sets - 10 reps - Supine Ankle Inversion and Eversion AROM  - 2 x daily - 3 sets - 10 reps - Supine Ankle Circles  - 2 x daily - 3 sets - 10 reps - Ankle Inversion Eversion Towel Slide  - 1 x daily - 3 sets - 10 reps - Seated Chair Push Ups  - 3 x weekly - 3 sets - 10 reps  ASSESSMENT:  CLINICAL IMPRESSION: The patient is an eight week post operative female currently performing toe touch weightbearing and presents with a small superficial opening at the superior aspect of the incision. Continues to report pain in the medial aspect of the ankle more than lateral. Is concerned with swelling still present on lateral aspect of ankle. Continued POC and interventions as laid out in prior PT visits  until WB restrictions are increased to WBAS. Ongoing guided rehabilitation will also support proper scar mobility, reduce stiffness, and help the patient transition toward more functional activities while protecting healing tissues.   OBJECTIVE IMPAIRMENTS: Abnormal gait, decreased activity tolerance, decreased balance, decreased coordination, decreased endurance, decreased mobility, difficulty walking, decreased ROM, decreased strength, hypomobility, and pain.   ACTIVITY LIMITATIONS: carrying, lifting, bending, sitting, standing, squatting, sleeping, stairs, transfers, bed mobility, bathing, toileting, and caring for others  PARTICIPATION LIMITATIONS: meal prep, cleaning, laundry, driving, shopping, community activity, occupation, and yard work  PERSONAL FACTORS: 3+ comorbidities: Cancer,  Chronic LBP, OA are also affecting patient's functional outcome.   REHAB POTENTIAL: Good  CLINICAL DECISION MAKING: Evolving/moderate complexity  EVALUATION COMPLEXITY: Moderate   GOALS: Goals reviewed with patient? Yes  SHORT TERM GOALS: Target date: 11/05/2024 Pt will be independent with HEP in order to decrease ankle pain and increase strength in order to improve pain-free function at home and work.  Baseline: EVAL- No formal HEP in place Goal status: INITIAL   LONG TERM GOALS: Target date: 12/18/2024  Patient will ambulate >=600 feet on level surfaces with least restrictive assistive device, progressing to no assistive device as medically appropriate, demonstrating normalized gait mechanics without antalgic pattern or limp, to allow safe household and community mobility. Baseline: EVAL- Patient currently using a knee scooter for all mobility Goal status: INITIAL  2.  Pt will increase LEFS by at least  40 points in order to demonstrate significant improvement in lower extremity function.  Baseline: EVAL = 1/80 Goal status: INITIAL  3.  Patient will report left ankle pain reduced to <=2/10 on NPRS at rest and <=3/10 with functional activities, with minimal swelling and no pain related to dependent positioning, allowing participation in household and community ambulation without activity limitation. Baseline: EVAL= up to 8/10 Left ankle pain Goal status: INITIAL  4.  Patient will increase left ankle strength to at least 4/5 in all planes and proximal lower extremity strength to at least 4+/5 to allow safe progression to full weight bearing and reduce fall risk. Baseline: EVAL= Left ankle = 2-/5 Goal status: INITIAL  5.  Patient will restore left ankle active range of motion to at least dorsiflexion 0-5 degrees, plantarflexion >=40 degrees, inversion >=20 degrees, and eversion >=10 degrees to support efficient gait mechanics and functional mobility. Baseline: EVAL- See ankle ROM  chart Goal status: INITIAL   PLAN:  PT FREQUENCY: 1-2x/week  PT DURATION: 12 weeks  PLANNED INTERVENTIONS: [id number]- PT Re-evaluation, 97750- Physical Performance Testing, 97110-Therapeutic exercises, 97530- Therapeutic activity, [id number]- Neuromuscular re-education, 97535- Self Care, [id number]- Manual therapy, [id number]- Gait training, [phone]- Orthotic Initial, [phone]- Orthotic/Prosthetic subsequent, [phone]- Canalith repositioning, [id number]- Aquatic Therapy, [phone]- Electrical stimulation (manual), [id number]- Ultrasound, [id number] (1-2 muscles), 20561 (3+ muscles)- Dry Needling, Patient/Family education, Balance training, Stair training, Taping, Joint mobilization, Joint manipulation, Manual lymph drainage, Scar mobilization, Compression bandaging, Vestibular training, DME instructions, Cryotherapy, and Moist heat  PLAN FOR NEXT SESSION:  Manual therapy for ROM/pain. Gentle therex for left ankle  Review of gait with walker and maintain any WB precautions Observe left ankle wound   Massie FORBES Dollar, PT 10/08/2024, 2:01 PM  "

## 2024-10-09 ENCOUNTER — Telehealth: Payer: Self-pay | Admitting: Orthopaedic Surgery

## 2024-10-09 NOTE — Telephone Encounter (Signed)
 Pt saw Ronal last week and has decided that she needs the prescription that Ronal said she could call in for her. Pharmacy is [facility] in [place]. Call back number is [phone].

## 2024-10-09 NOTE — Telephone Encounter (Signed)
 Spoke with patient. She said you mentioned Hydrocodone  to take after PT.  [facility] [place]

## 2024-10-09 NOTE — Telephone Encounter (Signed)
 Can you ask if it was pain medicine?

## 2024-10-10 ENCOUNTER — Ambulatory Visit: Admitting: Physical Therapy

## 2024-10-10 ENCOUNTER — Other Ambulatory Visit: Payer: Self-pay | Admitting: Physician Assistant

## 2024-10-10 DIAGNOSIS — M25572 Pain in left ankle and joints of left foot: Secondary | ICD-10-CM

## 2024-10-10 DIAGNOSIS — R278 Other lack of coordination: Secondary | ICD-10-CM

## 2024-10-10 DIAGNOSIS — R2681 Unsteadiness on feet: Secondary | ICD-10-CM

## 2024-10-10 DIAGNOSIS — R262 Difficulty in walking, not elsewhere classified: Secondary | ICD-10-CM

## 2024-10-10 DIAGNOSIS — R2689 Other abnormalities of gait and mobility: Secondary | ICD-10-CM

## 2024-10-10 DIAGNOSIS — M6281 Muscle weakness (generalized): Secondary | ICD-10-CM

## 2024-10-10 DIAGNOSIS — M79671 Pain in right foot: Secondary | ICD-10-CM

## 2024-10-10 DIAGNOSIS — M2560 Stiffness of unspecified joint, not elsewhere classified: Secondary | ICD-10-CM

## 2024-10-10 MED ORDER — HYDROCODONE-ACETAMINOPHEN 5-325 MG PO TABS: 1.0000 | ORAL_TABLET | Freq: Every day | ORAL | 0 refills | Status: AC | PRN

## 2024-10-10 NOTE — Telephone Encounter (Signed)
 Ok, thanks for asking.  I just sent

## 2024-10-10 NOTE — Therapy (Signed)
 " OUTPATIENT PHYSICAL THERAPY LOWER EXTREMITY TREATMENT   Patient Name: Vanessa Medina MRN: [id number] DOB:1971-01-26, 54 y.o., female Today's Date: 10/10/2024  END OF SESSION:  PT End of Session - 10/10/24 0839     Visit Number 4    Number of Visits 24    Date for Recertification  12/18/24    Authorization Type [employer]    PT Start Time [phone]    PT Stop Time 0930    PT Time Calculation (min) 40 min    Activity Tolerance Patient tolerated treatment well;Treatment limited secondary to medical complications (Comment)   TDWB LLE   Behavior During Therapy [facility] for tasks assessed/performed           Past Medical History:  Diagnosis Date   Anemia    Anesthesia complication    a.) PONV; b.) delayed emergence   Anxiety    Asthma    Benign cyst of kidney    Breast cancer (HCC) 01/18/2023   left breast-bil mastectomies   Cervical cancer (HCC)    Chronic back pain    Chronic hypotension    CKD (chronic kidney disease), stage II    Claustrophobia    Decompression injury of spinal cord    Depression    Dizziness    Family history of adverse reaction to anesthesia    a.) delayed emergence in 1st degree female relative (mother)   History of dysplastic nevus 01/18/2018   right shoulder, recurrent dysplastic nevus   History of kidney stones    History of shingles    Hypercholesteremia    Hypothyroidism    Inappropriate sinus node tachycardia    Migraine    Morbid obesity (HCC)    Multiple allergies    Neurogenic orthostatic hypotension (HCC)    Obesity    Osteoarthritis    left hip   Pneumonia    PONV (postoperative nausea and vomiting)    S/P insertion of spinal cord stimulator 01/24/2024   Seizure (HCC) 2021   x1-after a back surgery with spinal cord stimulator   Spinal cord stroke (HCC) 08/11/2020   spinal cord puncture during surgery   Wears glasses    Past Surgical History:  Procedure Laterality Date   ABDOMINAL HYSTERECTOMY     APPENDECTOMY     BACK  SURGERY     BILIOPANCREATIC DIVISION W DUODENAL SWITCH N/A    BREAST BIOPSY Left 12/06/2022   u/s bx,1:00 heart clip, path pending   BREAST BIOPSY Left 12/06/2022   us  bx 2:00 ribbon clip path pending   BREAST BIOPSY Left 12/06/2022   US  LT BREAST BX W LOC DEV EA ADD LESION IMG BX SPEC US  GUIDE 12/06/2022 [facility]   BREAST BIOPSY Left 12/06/2022   US  LT BREAST BX W LOC DEV 1ST LESION IMG BX SPEC US  GUIDE 12/06/2022 [facility]   BREAST RECONSTRUCTION WITH PLACEMENT OF TISSUE EXPANDER AND FLEX HD (ACELLULAR HYDRATED DERMIS) Bilateral 01/18/2023   Procedure: IMMEDIATE LEFT BREAST RECONSTRUCTION WITH PLACEMENT OF TISSUE EXPANDER AND FLEX HD (ACELLULAR HYDRATED DERMIS);  Surgeon: [name], DO;  Location: [facility];  Service: Plastics;  Laterality: Bilateral;   BREAST RECONSTRUCTION WITH PLACEMENT OF TISSUE EXPANDER AND FLEX HD (ACELLULAR HYDRATED DERMIS) Left 03/26/2023   Procedure: BREAST RECONSTRUCTION WITH PLACEMENT OF TISSUE EXPANDER;  Surgeon: [name], DO;  Location: [facility];  Service: Plastics;  Laterality: Left;   CHOLECYSTECTOMY     COLONOSCOPY N/A 06/27/2021   Procedure: COLONOSCOPY;  Surgeon: Onita Elspeth Sharper,  DO;  Location: [facility];  Service: Gastroenterology;  Laterality: N/A;   COLONOSCOPY N/A 08/27/2023   Procedure: COLONOSCOPY;  Surgeon: Onita Elspeth Sharper, DO;  Location: [facility];  Service: Gastroenterology;  Laterality: N/A;   DIAGNOSTIC LAPAROSCOPY     LOA   ESOPHAGOGASTRODUODENOSCOPY N/A 06/27/2021   Procedure: ESOPHAGOGASTRODUODENOSCOPY (EGD);  Surgeon: Onita Elspeth Sharper, DO;  Location: [facility];  Service: Gastroenterology;  Laterality: N/A;   ESOPHAGOGASTRODUODENOSCOPY (EGD) WITH PROPOFOL  N/A 07/25/2017   Procedure: ESOPHAGOGASTRODUODENOSCOPY (EGD) WITH PROPOFOL ;  Surgeon: Viktoria Lamar DASEN, MD;  Location: [facility] ENDOSCOPY;  Service: Endoscopy;  Laterality: N/A;   HERNIA REPAIR     KNEE  ARTHROSCOPY Right    LAPAROSCOPIC GASTRIC SLEEVE RESECTION WITH HIATAL HERNIA REPAIR     LIPOSUCTION WITH LIPOFILLING Bilateral 05/21/2024   Procedure: LIPOSUCTION, WITH FAT TRANSFER;  Surgeon: [name], DO;  Location: [facility];  Service: Plastics;  Laterality: Bilateral;  Bilateral breast fat grafting   LUMBAR FUSION  08/29/2022   Revision Lumbar two through five Laminectomy & Fusion with Transforaminal Lumbar interbody fusion   LUMBAR SPINAL CORD STIMULATOR REVISION Left 06/26/2024   Procedure: Left sided internal pulse generator site revision;  Surgeon: [name] ORN, MD;  Location: [facility];  Service: Neurosurgery;  Laterality: Left;   MASTECTOMY W/ SENTINEL NODE BIOPSY Bilateral 01/18/2023   Procedure: MASTECTOMY WITH SENTINEL LYMPH NODE BIOPSY, RNFA to assist;  Surgeon: Jordis Laneta FALCON, MD;  Location: [facility];  Service: General;  Laterality: Bilateral;   ORIF ANKLE FRACTURE Left 08/13/2024   Procedure: OPEN REDUCTION INTERNAL FIXATION (ORIF) ANKLE FRACTURE; Syndesmosis repair;  Surgeon: Jerri Kay HERO, MD;  Location: [facility];  Service: Orthopedics;  Laterality: Left;  OPEN REDUCTION INTERNAL FIXATION LEFT LATERAL MALLEOLUS FRACTURE, SYNDESMOSIS   PULSE GENERATOR IMPLANT N/A 01/24/2024   Procedure: UNILATERAL PULSE GENERATOR IMPLANT;  Surgeon: [name] ORN, MD;  Location: [facility];  Service: Neurosurgery;  Laterality: N/A;  INTERNAL PULSE GENERATOR PLACEMENT FOR SPINAL CORD STIMULATOR   REMOVAL OF TISSUE EXPANDER AND PLACEMENT OF IMPLANT Bilateral 06/06/2023   Procedure: REMOVAL OF TISSUE EXPANDER AND PLACEMENT OF IMPLANT;  Surgeon: [name], DO;  Location: [facility];  Service: Plastics;  Laterality: Bilateral;   SPINAL CORD STIMULATOR IMPLANT     THORACIC LAMINECTOMY FOR SPINAL CORD STIMULATOR N/A 01/15/2024   Procedure: THORACIC LAMINECTOMY FOR SPINAL CORD STIMULATOR;  Surgeon: [name] ORN, MD;  Location: [facility];  Service: Neurosurgery;  Laterality: N/A;  THORACIC LAMINECTOMY FOR SPINAL CORD STIMULATOR PADDLE TRIAL   TONSILLECTOMY     TOTAL HIP ARTHROPLASTY Left 01/26/2016   Procedure: LEFT TOTAL HIP ARTHROPLASTY ANTERIOR APPROACH;  Surgeon: Kay HERO Jerri, MD;  Location: [facility];  Service: Orthopedics;  Laterality: Left;   TRANSFORAMINAL LUMBAR INTERBODY FUSION (TLIF) WITH PEDICLE SCREW FIXATION 3 LEVEL  08/2019   Lynwood Better, MD L2-L5   Patient Active Problem List   Diagnosis Date Noted   Bimalleolar fracture of left ankle, closed, initial encounter 08/13/2024   Closed displaced fracture of lateral malleolus of left fibula 08/12/2024   Syndesmotic disruption of ankle, left, initial encounter 08/12/2024   Spinal cord stimulator dysfunction, sequela 06/12/2024   Breast asymmetry following reconstructive surgery 03/25/2024   S/P insertion of spinal cord stimulator 03/21/2024   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   Pain and swelling of lower leg 09/25/2023   Lymphedema 09/25/2023   Long term (current) use of aromatase inhibitors 09/07/2023   At risk for loss  of bone density 09/07/2023   Changing skin lesion 09/04/2023   Frequent falls 08/22/2023   Chronic pain syndrome 08/22/2023   Bilateral lower extremity edema 08/06/2023   Back pain 08/06/2023   Claustrophobia    Pernicious anemia 03/14/2023   Ruptured left breast implant 02/13/2023   S/P mastectomy, bilateral 01/18/2023   Genetic testing 12/18/2022   Breast cancer (HCC) 12/08/2022   Family history of breast cancer 12/08/2022   Profound fatigue 12/08/2022   Lumbar post-laminectomy syndrome 06/02/2022   Pseudarthrosis after fusion or arthrodesis 05/19/2022   Primary osteoarthritis of right knee 03/28/2022   Subjective tinnitus of both ears 12/21/2021   Abnormality of gait 04/29/2021   Abnormal sensation in both ears 04/06/2021   Other adverse food reactions, not elsewhere classified, subsequent encounter 04/06/2021   Multiple  drug allergies 04/06/2021   Insomnia due to medical condition 02/25/2021   Myofascial muscle pain 02/25/2021   Nerve pain 10/18/2020   Incomplete paraplegia (HCC) 10/18/2020   Orthostatic hypotension 10/18/2020   Renal cyst 09/23/2020   Chronic migraine without aura without status migrainosus, not intractable    Hypokalemia    Adjustment reaction with anxiety and depression    Spinal cord injury, lumbar, without spinal bone injury, sequela 08/18/2020   Paraparesis (HCC)    Post-operative pain    Hypotension    Slow transit constipation    AKI (acute kidney injury)    Right leg weakness 08/11/2020   DDD (degenerative disc disease), lumbar 09/02/2019    Class: Chronic   Degenerative disc disease, lumbar 09/02/2019   Chest tightness 09/23/2018   Bradycardia 09/23/2018   BMI 40.0-44.9, adult (HCC) 04/17/2018   Status post bariatric surgery 04/17/2018   Labile blood pressure 03/08/2018   Chronic low back pain 09/03/2017   History of total hip replacement, left 01/26/2016   Osteoarthritis of spine with radiculopathy, lumbar region 10/16/2014   LEG PAIN, BILATERAL 12/16/2007   Malignant neoplasm of cervix uteri (HCC) 07/02/2007   MORBID OBESITY 07/02/2007   DEPRESSION 07/02/2007   Migraine without aura 07/02/2007   Other allergic rhinitis 07/02/2007   Asthma 07/02/2007   GERD 07/02/2007   ELEVATED BLOOD PRESSURE WITHOUT DIAGNOSIS OF HYPERTENSION 07/02/2007   Asthma 07/02/2007    PCP: Dr. Lynwood Null  REFERRING PROVIDER: Jule Shuck PA-C  REFERRING DIAG: [phone] (ICD-10-CM) - Closed displaced fracture of lateral malleolus of left fibula, initial encounter   THERAPY DIAG:  Muscle weakness (generalized)  Difficulty in walking, not elsewhere classified  Unsteadiness on feet  Other abnormalities of gait and mobility  Other lack of coordination  Pain in left ankle and joints of left foot  Limited joint range of motion (ROM)  Pain in right foot  Rationale for  Evaluation and Treatment: Rehabilitation  ONSET DATE: 08/04/2024  SUBJECTIVE:   SUBJECTIVE STATEMENT: Patient is still 8-9 weeks post op, reports she is having a hard time getting up/down with weight bearing precautions.  States that she took father to eye doctor yesterday. States that her ankle was more swollen yesterday, so she arrives in clinic with ankle brace in place.     PERTINENT HISTORY: Per op note from 08/13/2024:  Ms. Meland is a 54 y.o.-year-old female who sustained a left ankle fracture; she was indicated for open reduction and internal fixation due to the displaced nature of the articular fracture and came to the operating room today for this procedure. The patient did consent to the procedure after discussion of the risks and benefits.   PREOPERATIVE DIAGNOSIS:  left bimalleolar ankle fracture with syndesmosis disruption   POSTOPERATIVE DIAGNOSIS: Same.   PROCEDURE:  Open treatment of left ankle fracture with internal fixation. Bimalleolar CPT [id number] Open treatment of left ankle syndesmosis with internal fixation. CPT [phone]   SURGEON: N. Ozell Cummins, M.D.   PAIN:  Are you having pain? Yes: NPRS scale: Current = 3/10; best= 0/10; worst = 8.5/10 Pain location: Left ankle from medial to ant to lateral Pain description: sharp, ache, electrical shock up to my toes Aggravating factors: dependent foot position causing swelling and throbbing or if I have been out to long on the scooter.  Relieving factors: Rest and elevation, tylenol   PRECAUTIONS: Fall and Other: Spinal stimulator  RED FLAGS: None   WEIGHT BEARING RESTRICTIONS:  S/p left ankle ORIF- TDWB until 10 weeks post-op  FALLS:  Has patient fallen in last 6 months? Yes. Number of falls 2  LIVING ENVIRONMENT: Lives with: lives with their family Lives in: House/apartment Stairs: Yes: External: 2 steps; can reach both Has following equipment at home: Single point cane, Walker - 2 wheeled, Walker - 4 wheeled,  shower chair, bed side commode, Grab bars, and knee scooter  OCCUPATION: Not currently working  PLOF: Independent with basic ADLs, Independent with household mobility with device, Independent with gait, Independent with transfers, Requires assistive device for independence, and Needs assistance with homemaking  PATIENT GOALS: I want to be able to walk without a limp  NEXT MD VISIT: 10/21/2024  OBJECTIVE:  Note: Objective measures were completed at Evaluation unless otherwise noted.  DIAGNOSTIC FINDINGS:  08/26/2024 Result Text  X-rays of the left ankle show stable alignment and fixation of a bimalleolar ankle fracture and syndesmosis    PATIENT SURVEYS:  LEFS  Extreme difficulty/unable (0), Quite a bit of difficulty (1), Moderate difficulty (2), Little difficulty (3), No difficulty (4) Survey date:  09/25/24  Any of your usual work, housework or school activities 0  2. Usual hobbies, recreational or sporting activities 0  3. Getting into/out of the bath 0  4. Walking between rooms 0  5. Putting on socks/shoes 0  6. Squatting  0  7. Lifting an object, like a bag of groceries from the floor 0  8. Performing light activities around your home 0  9. Performing heavy activities around your home 0  10. Getting into/out of a car 0  11. Walking 2 blocks 0  12. Walking 1 mile 0  13. Going up/down 10 stairs (1 flight) 0  14. Standing for 1 hour 0  15.  sitting for 1 hour 1  16. Running on even ground 0  17. Running on uneven ground 0  18. Making sharp turns while running fast 0  19. Hopping  0  20. Rolling over in bed 0  Score total:  1/80     COGNITION: Overall cognitive status: Within functional limits for tasks assessed     SENSATION: WFL  EDEMA:  Increased edema surround entire left ankle  SKIN INTEGRITY -Open area <34mm along superior incision on lateral ankle with min scabbing remaining and redness.    PALPATION: Tender to touch globally around ankle  LOWER  EXTREMITY ROM:  Active ROM Right eval Left eval  Hip flexion    Hip extension    Hip abduction    Hip adduction    Hip internal rotation    Hip external rotation    Knee flexion    Knee extension    Ankle dorsiflexion  Lacking 10 deg from neurtral  Ankle plantarflexion  18 deg  Ankle inversion  6   Ankle eversion  10   (Blank rows = not tested)  LOWER EXTREMITY MMT:  MMT Right eval Left eval  Hip flexion  4  Hip extension  4  Hip abduction  4  Hip adduction  4  Hip internal rotation  4  Hip external rotation  4  Knee flexion  3+  Knee extension  3+  Ankle dorsiflexion  2-  Ankle plantarflexion  2-  Ankle inversion  2-  Ankle eversion  2-   (Blank rows = not tested)  LOWER EXTREMITY SPECIAL TESTS:    FUNCTIONAL TESTS:    GAIT: Distance walked: 20 feet Assistive device utilized: Walker - 2 wheeled Level of assistance: CGA Comments: Patient instructed in walking maintaining TDWB LLE                                                                                                                                TREATMENT DATE: 10/10/24   Wound assessment: tiny wound at top of incision still present   0.1875 inch diameter   TE- To improve strength, endurance, mobility, and function of specific targeted muscle groups or improve joint range of motion or improve muscle flexibility  Seated:  Toe splaying x 20  Toe yoga x 20  Ankle inversion/eversion x 20  Ankle DF/PF on rocker board x 25 each  Lateral rocks for supination/pronation on rocker board  x 20 bil   Stand pivot transfer to mat table. Pt able to maintain NWB outside of boot for transfer.   Manual: Scar tissue massage and STM for Improved muscle extensibility in plantar surface of foot and gastroc/anterior tib x12 minutes; and edema management techniques avoiding open wound area   PATIENT EDUCATION:  Education details: PT plan of care; Purpose of PT; HEP, WB precautions. Importance of Incision/wound  daily inspection Person educated: Patient Education method: Explanation, Demonstration, Tactile cues, Verbal cues, and Handouts Education comprehension: verbalized understanding, returned demonstration, verbal cues required, tactile cues required, and needs further education  HOME EXERCISE PROGRAM: Access Code: [email or web address] URL: [email or web address] Date: 09/25/2024 Prepared by: Reyes London  Exercises - Supine Ankle Dorsiflexion and Plantarflexion AROM  - 1-2 x daily - 3 sets - 10 reps - Supine Ankle Inversion and Eversion AROM  - 2 x daily - 3 sets - 10 reps - Supine Ankle Circles  - 2 x daily - 3 sets - 10 reps - Ankle Inversion Eversion Towel Slide  - 1 x daily - 3 sets - 10 reps - Seated Chair Push Ups  - 3 x weekly - 3 sets - 10 reps  ASSESSMENT:  CLINICAL IMPRESSION: The patient is an eight-nine week post operative female currently performing toe touch weightbearing and presents with a small superficial opening at the superior aspect of the incision. Continues to report pain in the medial aspect of the ankle more than lateral. Is concerned with  swelling still present on lateral aspect of ankle. Continued POC and interventions as laid out in prior PT visits until WB restrictions are increased to WBAS. Therex performed in sitting with light pressure through sole of foot on airex pad and rocker board for improved sensory input. Ongoing guided rehabilitation will also support proper scar mobility, reduce stiffness, and help the patient transition toward more functional activities while protecting healing tissues.   OBJECTIVE IMPAIRMENTS: Abnormal gait, decreased activity tolerance, decreased balance, decreased coordination, decreased endurance, decreased mobility, difficulty walking, decreased ROM, decreased strength, hypomobility, and pain.   ACTIVITY LIMITATIONS: carrying, lifting, bending, sitting, standing, squatting, sleeping, stairs, transfers, bed mobility,  bathing, toileting, and caring for others  PARTICIPATION LIMITATIONS: meal prep, cleaning, laundry, driving, shopping, community activity, occupation, and yard work  PERSONAL FACTORS: 3+ comorbidities: Cancer, Chronic LBP, OA are also affecting patient's functional outcome.   REHAB POTENTIAL: Good  CLINICAL DECISION MAKING: Evolving/moderate complexity  EVALUATION COMPLEXITY: Moderate   GOALS: Goals reviewed with patient? Yes  SHORT TERM GOALS: Target date: 11/05/2024 Pt will be independent with HEP in order to decrease ankle pain and increase strength in order to improve pain-free function at home and work.  Baseline: EVAL- No formal HEP in place Goal status: INITIAL   LONG TERM GOALS: Target date: 12/18/2024  Patient will ambulate >=600 feet on level surfaces with least restrictive assistive device, progressing to no assistive device as medically appropriate, demonstrating normalized gait mechanics without antalgic pattern or limp, to allow safe household and community mobility. Baseline: EVAL- Patient currently using a knee scooter for all mobility Goal status: INITIAL  2.  Pt will increase LEFS by at least  40 points in order to demonstrate significant improvement in lower extremity function.  Baseline: EVAL = 1/80 Goal status: INITIAL  3.  Patient will report left ankle pain reduced to <=2/10 on NPRS at rest and <=3/10 with functional activities, with minimal swelling and no pain related to dependent positioning, allowing participation in household and community ambulation without activity limitation. Baseline: EVAL= up to 8/10 Left ankle pain Goal status: INITIAL  4.  Patient will increase left ankle strength to at least 4/5 in all planes and proximal lower extremity strength to at least 4+/5 to allow safe progression to full weight bearing and reduce fall risk. Baseline: EVAL= Left ankle = 2-/5 Goal status: INITIAL  5.  Patient will restore left ankle active range of  motion to at least dorsiflexion 0-5 degrees, plantarflexion >=40 degrees, inversion >=20 degrees, and eversion >=10 degrees to support efficient gait mechanics and functional mobility. Baseline: EVAL- See ankle ROM chart Goal status: INITIAL   PLAN:  PT FREQUENCY: 1-2x/week  PT DURATION: 12 weeks  PLANNED INTERVENTIONS: [id number]- PT Re-evaluation, 97750- Physical Performance Testing, 97110-Therapeutic exercises, 97530- Therapeutic activity, [id number]- Neuromuscular re-education, 97535- Self Care, [id number]- Manual therapy, [id number]- Gait training, [phone]- Orthotic Initial, [phone]- Orthotic/Prosthetic subsequent, [phone]- Canalith repositioning, [id number]- Aquatic Therapy, [phone]- Electrical stimulation (manual), [id number]- Ultrasound, [id number] (1-2 muscles), [id number] (3+ muscles)- Dry Needling, Patient/Family education, Balance training, Stair training, Taping, Joint mobilization, Joint manipulation, Manual lymph drainage, Scar mobilization, Compression bandaging, Vestibular training, DME instructions, Cryotherapy, and Moist heat  PLAN FOR NEXT SESSION:  Manual therapy for ROM/pain. Gentle therex for left ankle  Review of gait with walker and maintain any WB precautions Observe left ankle wound   Massie FORBES Dollar, PT 10/10/2024, 8:40 AM  "

## 2024-10-13 ENCOUNTER — Ambulatory Visit: Admitting: Physical Therapy

## 2024-10-14 ENCOUNTER — Ambulatory Visit: Admitting: Physical Therapy

## 2024-10-15 ENCOUNTER — Ambulatory Visit: Admitting: Physical Therapy

## 2024-10-16 ENCOUNTER — Ambulatory Visit: Admitting: Physical Therapy

## 2024-10-20 ENCOUNTER — Ambulatory Visit: Admitting: Physical Therapy

## 2024-10-21 ENCOUNTER — Encounter: Admitting: Physician Assistant

## 2024-10-21 ENCOUNTER — Ambulatory Visit: Admitting: Physical Therapy

## 2024-10-22 ENCOUNTER — Ambulatory Visit: Admitting: Podiatry

## 2024-10-24 ENCOUNTER — Ambulatory Visit: Admitting: Physical Therapy

## 2024-10-27 ENCOUNTER — Ambulatory Visit: Admitting: Physical Therapy

## 2024-10-28 ENCOUNTER — Ambulatory Visit: Admitting: Plastic Surgery

## 2024-10-31 ENCOUNTER — Ambulatory Visit: Admitting: Physical Therapy

## 2024-11-03 ENCOUNTER — Ambulatory Visit: Admitting: Physical Therapy

## 2024-11-05 ENCOUNTER — Ambulatory Visit: Admitting: Physical Therapy

## 2024-11-07 ENCOUNTER — Ambulatory Visit

## 2024-11-10 ENCOUNTER — Ambulatory Visit: Admitting: Physical Therapy

## 2024-11-11 ENCOUNTER — Ambulatory Visit: Admitting: Physical Therapy

## 2024-11-12 ENCOUNTER — Ambulatory Visit: Admitting: Physical Therapy

## 2024-11-17 ENCOUNTER — Ambulatory Visit: Admitting: Physical Therapy

## 2024-11-18 ENCOUNTER — Ambulatory Visit

## 2024-11-20 ENCOUNTER — Ambulatory Visit

## 2024-11-25 ENCOUNTER — Ambulatory Visit: Admitting: Physical Therapy

## 2024-11-27 ENCOUNTER — Ambulatory Visit: Admitting: Physical Therapy

## 2024-12-02 ENCOUNTER — Ambulatory Visit

## 2024-12-04 ENCOUNTER — Ambulatory Visit: Admitting: Physical Therapy

## 2024-12-09 ENCOUNTER — Ambulatory Visit: Admitting: Physical Therapy

## 2024-12-11 ENCOUNTER — Ambulatory Visit: Admitting: Physical Therapy

## 2024-12-16 ENCOUNTER — Ambulatory Visit: Admitting: Physical Therapy

## 2024-12-18 ENCOUNTER — Ambulatory Visit: Admitting: Physical Therapy

## 2024-12-23 ENCOUNTER — Ambulatory Visit: Admitting: Physical Therapy

## 2024-12-24 ENCOUNTER — Ambulatory Visit

## 2024-12-25 ENCOUNTER — Ambulatory Visit: Admitting: Physical Therapy

## 2024-12-30 ENCOUNTER — Ambulatory Visit: Admitting: Physical Therapy

## 2025-01-01 ENCOUNTER — Ambulatory Visit: Admitting: Physical Therapy

## 2025-01-06 ENCOUNTER — Ambulatory Visit: Admitting: Physical Therapy

## 2025-01-07 ENCOUNTER — Encounter: Admitting: Podiatry

## 2025-01-08 ENCOUNTER — Ambulatory Visit

## 2025-01-12 ENCOUNTER — Inpatient Hospital Stay: Admitting: Oncology

## 2025-01-13 ENCOUNTER — Ambulatory Visit: Admitting: Physical Therapy

## 2025-01-15 ENCOUNTER — Ambulatory Visit

## 2025-01-20 ENCOUNTER — Ambulatory Visit: Admitting: Physical Therapy

## 2025-01-21 ENCOUNTER — Encounter: Admitting: Podiatry

## 2025-01-22 ENCOUNTER — Ambulatory Visit

## 2025-02-11 ENCOUNTER — Encounter: Admitting: Podiatry

## 2025-07-29 ENCOUNTER — Ambulatory Visit
